# Patient Record
Sex: Female | Born: 1955
Health system: Southern US, Community
[De-identification: ages and names within clinical notes are randomized; demographics above are authoritative.]

## PROBLEM LIST (undated history)

## (undated) ENCOUNTER — Emergency Department: Admission: EM | Payer: Medicare (Managed Care) | Source: Home / Self Care

## (undated) DIAGNOSIS — C541 Malignant neoplasm of endometrium: Secondary | ICD-10-CM

## (undated) DIAGNOSIS — E785 Hyperlipidemia, unspecified: Secondary | ICD-10-CM

## (undated) DIAGNOSIS — E119 Type 2 diabetes mellitus without complications: Secondary | ICD-10-CM

## (undated) DIAGNOSIS — D649 Anemia, unspecified: Secondary | ICD-10-CM

## (undated) DIAGNOSIS — U071 COVID-19: Secondary | ICD-10-CM

## (undated) DIAGNOSIS — I10 Essential (primary) hypertension: Secondary | ICD-10-CM

## (undated) DIAGNOSIS — E669 Obesity, unspecified: Secondary | ICD-10-CM

## (undated) DIAGNOSIS — K219 Gastro-esophageal reflux disease without esophagitis: Secondary | ICD-10-CM

## (undated) DIAGNOSIS — I509 Heart failure, unspecified: Secondary | ICD-10-CM

## (undated) DIAGNOSIS — I2699 Other pulmonary embolism without acute cor pulmonale: Secondary | ICD-10-CM

## (undated) DIAGNOSIS — K31819 Angiodysplasia of stomach and duodenum without bleeding: Secondary | ICD-10-CM

## (undated) DIAGNOSIS — D696 Thrombocytopenia, unspecified: Secondary | ICD-10-CM

## (undated) DIAGNOSIS — A419 Sepsis, unspecified organism: Secondary | ICD-10-CM

## (undated) DIAGNOSIS — Z86718 Personal history of other venous thrombosis and embolism: Secondary | ICD-10-CM

## (undated) DIAGNOSIS — J9600 Acute respiratory failure, unspecified whether with hypoxia or hypercapnia: Secondary | ICD-10-CM

## (undated) DIAGNOSIS — C801 Malignant (primary) neoplasm, unspecified: Secondary | ICD-10-CM

## (undated) DIAGNOSIS — E7800 Pure hypercholesterolemia, unspecified: Secondary | ICD-10-CM

## (undated) HISTORY — DX: Type 2 diabetes mellitus without complications: E11.9

## (undated) HISTORY — DX: Pure hypercholesterolemia, unspecified: E78.00

## (undated) HISTORY — DX: Anemia, unspecified: D64.9

## (undated) HISTORY — DX: Essential (primary) hypertension: I10

## (undated) HISTORY — PX: TUBAL LIGATION: SHX77

---

## 2004-10-09 ENCOUNTER — Emergency Department: Payer: Self-pay | Admitting: Emergency Medicine

## 2007-01-29 ENCOUNTER — Emergency Department: Payer: Self-pay | Admitting: Emergency Medicine

## 2008-03-18 ENCOUNTER — Emergency Department: Payer: Self-pay | Admitting: Emergency Medicine

## 2011-11-02 ENCOUNTER — Emergency Department: Payer: Self-pay | Admitting: *Deleted

## 2014-05-11 ENCOUNTER — Ambulatory Visit: Payer: Self-pay

## 2020-08-18 ENCOUNTER — Encounter: Payer: Self-pay | Admitting: Emergency Medicine

## 2020-08-18 ENCOUNTER — Inpatient Hospital Stay: Payer: Medicare HMO

## 2020-08-18 ENCOUNTER — Emergency Department: Payer: Medicare HMO

## 2020-08-18 ENCOUNTER — Inpatient Hospital Stay
Admission: EM | Admit: 2020-08-18 | Discharge: 2020-08-24 | DRG: 163 | Disposition: A | Discharge status: Skilled Nursing Facility | Payer: Medicare HMO | Attending: Internal Medicine | Admitting: Internal Medicine

## 2020-08-18 ENCOUNTER — Other Ambulatory Visit (INDEPENDENT_AMBULATORY_CARE_PROVIDER_SITE_OTHER): Payer: Self-pay | Admitting: Vascular Surgery

## 2020-08-18 ENCOUNTER — Encounter
Admission: EM | Disposition: A | Discharge status: Skilled Nursing Facility | Payer: Self-pay | Source: Home / Self Care | Attending: Internal Medicine

## 2020-08-18 ENCOUNTER — Other Ambulatory Visit: Payer: Self-pay

## 2020-08-18 DIAGNOSIS — I82452 Acute embolism and thrombosis of left peroneal vein: Secondary | ICD-10-CM | POA: Diagnosis present

## 2020-08-18 DIAGNOSIS — Z807 Family history of other malignant neoplasms of lymphoid, hematopoietic and related tissues: Secondary | ICD-10-CM | POA: Diagnosis not present

## 2020-08-18 DIAGNOSIS — Z20822 Contact with and (suspected) exposure to covid-19: Secondary | ICD-10-CM | POA: Diagnosis present

## 2020-08-18 DIAGNOSIS — N950 Postmenopausal bleeding: Secondary | ICD-10-CM | POA: Diagnosis present

## 2020-08-18 DIAGNOSIS — Z48812 Encounter for surgical aftercare following surgery on the circulatory system: Secondary | ICD-10-CM | POA: Diagnosis not present

## 2020-08-18 DIAGNOSIS — I2699 Other pulmonary embolism without acute cor pulmonale: Secondary | ICD-10-CM | POA: Diagnosis not present

## 2020-08-18 DIAGNOSIS — N939 Abnormal uterine and vaginal bleeding, unspecified: Secondary | ICD-10-CM | POA: Diagnosis not present

## 2020-08-18 DIAGNOSIS — Z6841 Body Mass Index (BMI) 40.0 and over, adult: Secondary | ICD-10-CM | POA: Diagnosis not present

## 2020-08-18 DIAGNOSIS — I10 Essential (primary) hypertension: Secondary | ICD-10-CM | POA: Diagnosis not present

## 2020-08-18 DIAGNOSIS — R0602 Shortness of breath: Secondary | ICD-10-CM

## 2020-08-18 DIAGNOSIS — J9691 Respiratory failure, unspecified with hypoxia: Secondary | ICD-10-CM

## 2020-08-18 DIAGNOSIS — Z87891 Personal history of nicotine dependence: Secondary | ICD-10-CM

## 2020-08-18 DIAGNOSIS — Z743 Need for continuous supervision: Secondary | ICD-10-CM | POA: Diagnosis not present

## 2020-08-18 DIAGNOSIS — R600 Localized edema: Secondary | ICD-10-CM | POA: Diagnosis not present

## 2020-08-18 DIAGNOSIS — Z95828 Presence of other vascular implants and grafts: Secondary | ICD-10-CM | POA: Diagnosis not present

## 2020-08-18 DIAGNOSIS — I82432 Acute embolism and thrombosis of left popliteal vein: Secondary | ICD-10-CM | POA: Diagnosis present

## 2020-08-18 DIAGNOSIS — R000 Tachycardia, unspecified: Secondary | ICD-10-CM | POA: Diagnosis not present

## 2020-08-18 DIAGNOSIS — E1165 Type 2 diabetes mellitus with hyperglycemia: Secondary | ICD-10-CM | POA: Diagnosis not present

## 2020-08-18 DIAGNOSIS — I82442 Acute embolism and thrombosis of left tibial vein: Secondary | ICD-10-CM | POA: Diagnosis not present

## 2020-08-18 DIAGNOSIS — Z23 Encounter for immunization: Secondary | ICD-10-CM

## 2020-08-18 DIAGNOSIS — I110 Hypertensive heart disease with heart failure: Secondary | ICD-10-CM | POA: Diagnosis not present

## 2020-08-18 DIAGNOSIS — J9601 Acute respiratory failure with hypoxia: Secondary | ICD-10-CM | POA: Diagnosis present

## 2020-08-18 DIAGNOSIS — R279 Unspecified lack of coordination: Secondary | ICD-10-CM | POA: Diagnosis not present

## 2020-08-18 DIAGNOSIS — I5031 Acute diastolic (congestive) heart failure: Secondary | ICD-10-CM | POA: Diagnosis not present

## 2020-08-18 DIAGNOSIS — E119 Type 2 diabetes mellitus without complications: Secondary | ICD-10-CM

## 2020-08-18 DIAGNOSIS — D649 Anemia, unspecified: Secondary | ICD-10-CM | POA: Diagnosis present

## 2020-08-18 DIAGNOSIS — E785 Hyperlipidemia, unspecified: Secondary | ICD-10-CM | POA: Diagnosis not present

## 2020-08-18 DIAGNOSIS — Z7901 Long term (current) use of anticoagulants: Secondary | ICD-10-CM | POA: Diagnosis not present

## 2020-08-18 DIAGNOSIS — J9602 Acute respiratory failure with hypercapnia: Secondary | ICD-10-CM

## 2020-08-18 DIAGNOSIS — R0689 Other abnormalities of breathing: Secondary | ICD-10-CM | POA: Diagnosis not present

## 2020-08-18 DIAGNOSIS — I2602 Saddle embolus of pulmonary artery with acute cor pulmonale: Secondary | ICD-10-CM | POA: Diagnosis not present

## 2020-08-18 DIAGNOSIS — E66813 Obesity, class 3: Secondary | ICD-10-CM | POA: Diagnosis present

## 2020-08-18 DIAGNOSIS — M7989 Other specified soft tissue disorders: Secondary | ICD-10-CM | POA: Diagnosis not present

## 2020-08-18 DIAGNOSIS — R5381 Other malaise: Secondary | ICD-10-CM | POA: Diagnosis not present

## 2020-08-18 DIAGNOSIS — I2609 Other pulmonary embolism with acute cor pulmonale: Secondary | ICD-10-CM | POA: Diagnosis not present

## 2020-08-18 DIAGNOSIS — Z7984 Long term (current) use of oral hypoglycemic drugs: Secondary | ICD-10-CM | POA: Diagnosis not present

## 2020-08-18 DIAGNOSIS — I2692 Saddle embolus of pulmonary artery without acute cor pulmonale: Secondary | ICD-10-CM | POA: Diagnosis not present

## 2020-08-18 DIAGNOSIS — R069 Unspecified abnormalities of breathing: Secondary | ICD-10-CM | POA: Diagnosis not present

## 2020-08-18 DIAGNOSIS — I5030 Unspecified diastolic (congestive) heart failure: Secondary | ICD-10-CM | POA: Diagnosis not present

## 2020-08-18 HISTORY — PX: IVC FILTER INSERTION: CATH118245

## 2020-08-18 HISTORY — PX: PULMONARY THROMBECTOMY: CATH118295

## 2020-08-18 LAB — COMPREHENSIVE METABOLIC PANEL
ALT: 11 U/L (ref 0–44)
AST: 23 U/L (ref 15–41)
Albumin: 3.5 g/dL (ref 3.5–5.0)
Alkaline Phosphatase: 70 U/L (ref 38–126)
Anion gap: 12 (ref 5–15)
BUN: 16 mg/dL (ref 8–23)
CO2: 24 mmol/L (ref 22–32)
Calcium: 9.4 mg/dL (ref 8.9–10.3)
Chloride: 102 mmol/L (ref 98–111)
Creatinine, Ser: 0.92 mg/dL (ref 0.44–1.00)
GFR, Estimated: >60 mL/min (ref >60)
Glucose, Bld: 225 mg/dL — ABNORMAL HIGH (ref 70–99)
Potassium: 4 mmol/L (ref 3.5–5.1)
Sodium: 138 mmol/L (ref 135–145)
Total Bilirubin: 0.9 mg/dL (ref 0.3–1.2)
Total Protein: 7.8 g/dL (ref 6.5–8.1)

## 2020-08-18 LAB — CBC WITH DIFFERENTIAL/PLATELET
Abs Immature Granulocytes: 0.03 10*3/uL (ref 0.00–0.07)
Basophils Absolute: 0.1 10*3/uL (ref 0.0–0.1)
Basophils Relative: 1 %
Eosinophils Absolute: 0.1 10*3/uL (ref 0.0–0.5)
Eosinophils Relative: 1 %
HCT: 39.7 % (ref 36.0–46.0)
Hemoglobin: 12.3 g/dL (ref 12.0–15.0)
Immature Granulocytes: 0 %
Lymphocytes Relative: 18 %
Lymphs Abs: 1.6 10*3/uL (ref 0.7–4.0)
MCH: 25.8 pg — ABNORMAL LOW (ref 26.0–34.0)
MCHC: 31 g/dL (ref 30.0–36.0)
MCV: 83.2 fL (ref 80.0–100.0)
Monocytes Absolute: 0.5 10*3/uL (ref 0.1–1.0)
Monocytes Relative: 6 %
Neutro Abs: 6.5 10*3/uL (ref 1.7–7.7)
Neutrophils Relative %: 74 %
Platelets: 269 10*3/uL (ref 150–400)
RBC: 4.77 MIL/uL (ref 3.87–5.11)
RDW: 16.8 % — ABNORMAL HIGH (ref 11.5–15.5)
WBC: 8.8 10*3/uL (ref 4.0–10.5)
nRBC: 0 % (ref 0.0–0.2)

## 2020-08-18 LAB — BRAIN NATRIURETIC PEPTIDE: B Natriuretic Peptide: 720.6 pg/mL — ABNORMAL HIGH (ref 0.0–100.0)

## 2020-08-18 LAB — RESP PANEL BY RT-PCR (FLU A&B, COVID) ARPGX2
Influenza A by PCR: NEGATIVE
Influenza B by PCR: NEGATIVE
SARS Coronavirus 2 by RT PCR: NEGATIVE

## 2020-08-18 LAB — HEPARIN LEVEL (UNFRACTIONATED): Heparin Unfractionated: <0.1 IU/mL — ABNORMAL LOW (ref 0.30–0.70)

## 2020-08-18 LAB — TROPONIN I (HIGH SENSITIVITY): Troponin I (High Sensitivity): 73 ng/L — ABNORMAL HIGH (ref <18)

## 2020-08-18 LAB — APTT: aPTT: 32 seconds (ref 24–36)

## 2020-08-18 LAB — PROTIME-INR
INR: 1.3 — ABNORMAL HIGH (ref 0.8–1.2)
Prothrombin Time: 15.4 seconds — ABNORMAL HIGH (ref 11.4–15.2)

## 2020-08-18 LAB — GLUCOSE, CAPILLARY: Glucose-Capillary: 191 mg/dL — ABNORMAL HIGH (ref 70–99)

## 2020-08-18 LAB — TSH: TSH: 3.425 u[IU]/mL (ref 0.350–4.500)

## 2020-08-18 LAB — PROCALCITONIN: Procalcitonin: <0.1 ng/mL

## 2020-08-18 SURGERY — PULMONARY THROMBECTOMY
Anesthesia: Moderate Sedation

## 2020-08-18 MED ORDER — FAMOTIDINE 20 MG PO TABS: 40.0000 mg | ORAL_TABLET | Freq: Once | ORAL | Status: DC | PRN

## 2020-08-18 MED ORDER — HEPARIN (PORCINE) 25000 UT/250ML-% IV SOLN
1200.0000 [IU]/h | INTRAVENOUS | Status: DC
  Administered 2020-08-18: 1200 [IU]/h via INTRAVENOUS
  Filled 2020-08-18: qty 250

## 2020-08-18 MED ORDER — INFLUENZA VAC A&B SA ADJ QUAD 0.5 ML IM PRSY
0.5000 mL | PREFILLED_SYRINGE | INTRAMUSCULAR | Status: AC
  Administered 2020-08-21: 0.5 mL via INTRAMUSCULAR
  Filled 2020-08-18 (×2): qty 0.5

## 2020-08-18 MED ORDER — FENTANYL CITRATE (PF) 100 MCG/2ML IJ SOLN
INTRAMUSCULAR | Status: DC | PRN
  Administered 2020-08-18: 25 ug via INTRAVENOUS

## 2020-08-18 MED ORDER — METHYLPREDNISOLONE SODIUM SUCC 125 MG IJ SOLR
125.0000 mg | Freq: Once | INTRAMUSCULAR | Status: AC
  Administered 2020-08-18: 125 mg via INTRAVENOUS
  Filled 2020-08-18: qty 2

## 2020-08-18 MED ORDER — MIDAZOLAM HCL 2 MG/2ML IJ SOLN
INTRAMUSCULAR | Status: DC | PRN
  Administered 2020-08-18: 0.5 mg via INTRAVENOUS

## 2020-08-18 MED ORDER — IPRATROPIUM-ALBUTEROL 0.5-2.5 (3) MG/3ML IN SOLN
3.0000 mL | Freq: Once | RESPIRATORY_TRACT | Status: AC
  Administered 2020-08-18: 3 mL via RESPIRATORY_TRACT
  Filled 2020-08-18: qty 3

## 2020-08-18 MED ORDER — ACETAMINOPHEN 325 MG PO TABS: 325.0000 mg | ORAL_TABLET | Freq: Four times a day (QID) | ORAL | Status: AC | PRN

## 2020-08-18 MED ORDER — ONDANSETRON HCL 4 MG/2ML IJ SOLN: 4.0000 mg | Freq: Four times a day (QID) | INTRAMUSCULAR | Status: DC | PRN

## 2020-08-18 MED ORDER — PNEUMOCOCCAL VAC POLYVALENT 25 MCG/0.5ML IJ INJ
0.5000 mL | INJECTION | INTRAMUSCULAR | Status: AC
  Administered 2020-08-21: 0.5 mL via INTRAMUSCULAR
  Filled 2020-08-18: qty 0.5

## 2020-08-18 MED ORDER — MIDAZOLAM HCL 2 MG/2ML IJ SOLN
INTRAMUSCULAR | Status: AC
  Administered 2020-08-18: 1 mg
  Filled 2020-08-18: qty 2

## 2020-08-18 MED ORDER — IODIXANOL 320 MG/ML IV SOLN
INTRAVENOUS | Status: DC | PRN
  Administered 2020-08-18: 75 mL

## 2020-08-18 MED ORDER — MIDAZOLAM HCL 2 MG/ML PO SYRP: 8.0000 mg | ORAL_SOLUTION | Freq: Once | ORAL | Status: DC | PRN

## 2020-08-18 MED ORDER — HYDROMORPHONE HCL 1 MG/ML IJ SOLN: 1.0000 mg | Freq: Once | INTRAMUSCULAR | Status: DC | PRN

## 2020-08-18 MED ORDER — CEFAZOLIN SODIUM-DEXTROSE 1-4 GM/50ML-% IV SOLN
1.0000 g | Freq: Once | INTRAVENOUS | Status: AC
  Administered 2020-08-18: 1 g via INTRAVENOUS
  Filled 2020-08-18: qty 50

## 2020-08-18 MED ORDER — METHYLPREDNISOLONE SODIUM SUCC 125 MG IJ SOLR: 125.0000 mg | Freq: Once | INTRAMUSCULAR | Status: DC | PRN

## 2020-08-18 MED ORDER — HEPARIN BOLUS VIA INFUSION
5000.0000 [IU] | Freq: Once | INTRAVENOUS | Status: AC
  Administered 2020-08-18: 5000 [IU] via INTRAVENOUS
  Filled 2020-08-18: qty 5000

## 2020-08-18 MED ORDER — FENTANYL CITRATE (PF) 100 MCG/2ML IJ SOLN
INTRAMUSCULAR | Status: AC
  Administered 2020-08-18: 50 ug
  Filled 2020-08-18: qty 2

## 2020-08-18 MED ORDER — ACETAMINOPHEN 650 MG RE SUPP: 325.0000 mg | Freq: Four times a day (QID) | RECTAL | Status: AC | PRN

## 2020-08-18 MED ORDER — ONDANSETRON HCL 4 MG PO TABS: 4.0000 mg | ORAL_TABLET | Freq: Four times a day (QID) | ORAL | Status: DC | PRN

## 2020-08-18 MED ORDER — SODIUM CHLORIDE 0.9 % IV SOLN: INTRAVENOUS | Status: DC

## 2020-08-18 MED ORDER — IOHEXOL 350 MG/ML SOLN
75.0000 mL | Freq: Once | INTRAVENOUS | Status: AC | PRN
  Administered 2020-08-18: 75 mL via INTRAVENOUS

## 2020-08-18 MED ORDER — DIPHENHYDRAMINE HCL 50 MG/ML IJ SOLN: 50.0000 mg | Freq: Once | INTRAMUSCULAR | Status: DC | PRN

## 2020-08-18 SURGICAL SUPPLY — 24 items
CANISTER PENUMBRA ENGINE (MISCELLANEOUS) ×1 IMPLANT
CATH ANGIO 5F PIGTAIL 100CM (CATHETERS) ×1 IMPLANT
CATH BEACON 5 .035 100 JB2 TIP (CATHETERS) ×1 IMPLANT
CATH G XP .038X100 (CATHETERS) ×1 IMPLANT
CATH INFINITI JR4 5F (CATHETERS) ×1 IMPLANT
CATH LIGHTNING 8 XTORQ 115 (CATHETERS) ×1 IMPLANT
CATH SELECT BERN TIP 5F 130 (CATHETERS) ×1 IMPLANT
CATH VERT 5X100 (CATHETERS) ×1 IMPLANT
DEVICE SAFEGUARD 24CM (GAUZE/BANDAGES/DRESSINGS) ×1 IMPLANT
GLIDEWIRE ADV .035X260CM (WIRE) ×1 IMPLANT
GLIDEWIRE ANGLED SS 035X260CM (WIRE) ×1 IMPLANT
GUIDEWIRE SUPER STIFF .035X180 (WIRE) ×1 IMPLANT
GUIDEWIRE VERSACORE 260 (WIRE) ×1 IMPLANT
KIT FEMORAL DEL DENALI (Miscellaneous) ×1 IMPLANT
NDL ENTRY 21GA 7CM ECHOTIP (NEEDLE) IMPLANT
NEEDLE ENTRY 21GA 7CM ECHOTIP (NEEDLE) ×2 IMPLANT
PACK ANGIOGRAPHY (CUSTOM PROCEDURE TRAY) ×2 IMPLANT
SET INTRO CAPELLA COAXIAL (SET/KITS/TRAYS/PACK) ×1 IMPLANT
SHEATH BRITE TIP 8FRX11 (SHEATH) ×2 IMPLANT
SHEATH DESTINIATION 65 8FR (SHEATH) ×1 IMPLANT
SYR MEDRAD MARK 7 150ML (SYRINGE) ×1 IMPLANT
TUBING CONTRAST HIGH PRESS 72 (TUBING) ×1 IMPLANT
WIRE AMPLATZ SSTIFF .035X260CM (WIRE) ×1 IMPLANT
WIRE GUIDERIGHT .035X150 (WIRE) ×2 IMPLANT

## 2020-08-18 NOTE — ED Provider Notes (Signed)
[facility] Emergency Department Provider Note  ____________________________________________   Event Date/Time   First MD Initiated Contact with Patient 08/18/20 1249     (approximate)  I have reviewed the triage vital signs and the nursing notes.   HISTORY  Chief Complaint Shortness of Breath    HPI Courtney Mack is a 65 y.o. female otherwise healthy but has not seen a doctor in 10 years who comes in for shortness of breath.  Patient endorses sudden onset of shortness of breath 1-1/2 days ago, constant, nothing makes it better, nothing makes it worse.  Denies any significant cough or fevers.  Is not Covid vaccinated but has not been around anybody with Covid that she knows.  She denies any chest pain or abdominal pain.  States that she always has some swollen legs but nothing that seems to have changed recently.  Denies smoking history or history of asthma.          History reviewed. No pertinent past medical history.  There are no problems to display for this patient.   Past Surgical History:  Procedure Laterality Date  . TUBAL LIGATION      Prior to Admission medications   Not on File    Allergies Patient has no known allergies.  No family history on file.  Social History Social History   Tobacco Use  . Smoking status: Former Research scientist (life sciences)  . Smokeless tobacco: Never Used  Substance Use Topics  . Alcohol use: Not Currently  . Drug use: Not Currently      Review of Systems Constitutional: No fever/chills Eyes: No visual changes. ENT: No sore throat. Cardiovascular: No chest pain Respiratory: Positive for SOB Gastrointestinal: No abdominal pain.  No nausea, no vomiting.  No diarrhea.  No constipation. Genitourinary: Negative for dysuria. Musculoskeletal: Negative for back pain. Skin: Negative for rash. Neurological: Negative for headaches, focal weakness or numbness. All other ROS  negative ____________________________________________   PHYSICAL EXAM:  VITAL SIGNS: ED Triage Vitals  Enc Vitals Group     BP 08/18/20 1155 136/82     Pulse Rate 08/18/20 1150 (!) 110     Resp 08/18/20 1150 (!) 24     Temp 08/18/20 1155 97.7 F (36.5 C)     Temp Source 08/18/20 1155 Oral     SpO2 08/18/20 1150 94 %     Weight 08/18/20 1151 250 lb (113.4 kg)     Height 08/18/20 1151 4\' 10"  (1.473 m)     Head Circumference --      Peak Flow --      Pain Score 08/18/20 1150 0     Pain Loc --      Pain Edu? --      Excl. in [place]? --     Constitutional: Alert and oriented. Well appearing and in no acute distress. Eyes: Conjunctivae are normal. EOMI. Head: Atraumatic. Nose: No congestion/rhinnorhea. Mouth/Throat: Mucous membranes are moist.   Neck: No stridor. Trachea Midline. FROM Cardiovascular: Tachycardic, regular rhythm. Grossly normal heart sounds.  Good peripheral circulation. Respiratory: Increased work of breathing with expiratory and inspiratory wheezing bilaterally Gastrointestinal: Soft and nontender. No distention. No abdominal bruits.  Musculoskeletal: No lower extremity tenderness nor edema.  No joint effusions. Neurologic:  Normal speech and language. No gross focal neurologic deficits are appreciated.  Skin:  Skin is warm, dry and intact. No rash noted. Psychiatric: Mood and affect are normal. Speech and behavior are normal. GU: Deferred   ____________________________________________   LABS (  all labs ordered are listed, but only abnormal results are displayed)  Labs Reviewed  CBC WITH DIFFERENTIAL/PLATELET - Abnormal; Notable for the following components:      Result Value   MCH 25.8 (*)    RDW 16.8 (*)    All other components within normal limits  COMPREHENSIVE METABOLIC PANEL - Abnormal; Notable for the following components:   Glucose, Bld 225 (*)    All other components within normal limits   ____________________________________________   ED ECG  REPORT I, Vanessa [place], the attending physician, personally viewed and interpreted this ECG.  Sinus tachycardia rate of 111, no ST elevation, T wave inversion in lead II, III and V4 V5, normal intervals ____________________________________________  RADIOLOGY Robert Bellow, personally viewed and evaluated these images (plain radiographs) as part of my medical decision making, as well as reviewing the written report by the radiologist.  ED MD interpretation: No pneumonia noted  Official radiology report(s): DG Chest 2 View  Result Date: 08/18/2020 CLINICAL DATA:  Worsening shortness of breath over the past 2 days. EXAM: CHEST - 2 VIEW COMPARISON:  Left rib x-rays dated January 29, 2007. FINDINGS: The heart size and mediastinal contours are within normal limits. Both lungs are clear. The visualized skeletal structures are unremarkable. Mild elevation of the right hemidiaphragm. IMPRESSION: 1. No acute cardiopulmonary disease. Electronically Signed   By: Titus Dubin M.D.   On: 08/18/2020 13:15    ____________________________________________   PROCEDURES  Procedure(s) performed (including Critical Care):  .1-3 Lead EKG Interpretation Performed by: Vanessa [place], MD Authorized by: Vanessa [place], MD     Interpretation: abnormal     ECG rate:  110   ECG rate assessment: normal     Rhythm: sinus tachycardia     Ectopy: none     Conduction: normal    .Critical Care Performed by: Vanessa [place], MD Authorized by: Vanessa Epes, MD   Critical care provider statement:    Critical care time (minutes):  45   Critical care was necessary to treat or prevent imminent or life-threatening deterioration of the following conditions:  Respiratory failure   Critical care was time spent personally by me on the following activities:  Discussions with consultants, evaluation of patient's response to treatment, examination of patient, ordering and performing treatments and interventions, ordering and  review of laboratory studies, ordering and review of radiographic studies, pulse oximetry, re-evaluation of patient's condition, obtaining history from patient or surrogate and review of old charts     ____________________________________________   INITIAL IMPRESSION / [place] / ED COURSE   Courtney Mack was evaluated in Emergency Department on 08/18/2020 for the symptoms described in the history of present illness. She was evaluated in the context of the global COVID-19 pandemic, which necessitated consideration that the patient might be at risk for infection with the SARS-CoV-2 virus that causes COVID-19. Institutional protocols and algorithms that pertain to the evaluation of patients at risk for COVID-19 are in a state of rapid change based on information released by regulatory bodies including the CDC and federal and state organizations. These policies and algorithms were followed during the patient's care in the ED.     Pt presents with SOB. Differential includes: PNA-will get xray to evaluation Anemia-CBC to evaluate ACS- will get trops Arrhythmia-Will get EKG and keep on monitor.  COVID- will get testing per algorithm. PE-if x-ray is negative consider work-up with CT scan   Patient was satting 94% on  room air so placed on 2 L.  She is having increased work of breathing and wheezing bilaterally.  Will trial DuoNeb and see if that helps her work of breathing.  Labs are reassuring although her cardiac marker was elevated to 73 and her BNP was elevated to 720.  Her chest x-ray did not show significant edema.  I am concerned about the possibility of PE given these elevated heart markers in the setting of a negative chest x-ray.  I think we should proceed with CT PE to rule out pulmonary embolism.  Her Covid swab is still pending.  Patient states she feels better after breathing treatments Covid swab was negative. CT scan is still pending.   CT scan is positive for large  PE with right heart strain. I did discuss with the vascular team Dr. Delana Meyer is currently in surgery. Did talk to his PA who was okay with patient being admitted here and started on heparin. Discussed with patient she has no risk factors for bleeding. Patient feels comfortable with coming to the hospital.  ____________________________________________   FINAL CLINICAL IMPRESSION(S) / ED DIAGNOSES   Final diagnoses:  Saddle embolus of pulmonary artery with acute cor pulmonale, unspecified chronicity ([place])  Acute respiratory failure with hypoxia ([place])     MEDICATIONS GIVEN DURING THIS VISIT:  Medications  heparin bolus via infusion 5,000 Units (has no administration in time range)  heparin ADULT infusion 100 units/mL (25000 units/241mL) (has no administration in time range)  ipratropium-albuterol (DUONEB) 0.5-2.5 (3) MG/3ML nebulizer solution 3 mL (3 mLs Nebulization Given 08/18/20 1332)  ipratropium-albuterol (DUONEB) 0.5-2.5 (3) MG/3ML nebulizer solution 3 mL (3 mLs Nebulization Given 08/18/20 1505)  ipratropium-albuterol (DUONEB) 0.5-2.5 (3) MG/3ML nebulizer solution 3 mL (3 mLs Nebulization Given 08/18/20 1505)  methylPREDNISolone sodium succinate (SOLU-MEDROL) 125 mg/2 mL injection 125 mg (125 mg Intravenous Given 08/18/20 1505)  iohexol (OMNIPAQUE) 350 MG/ML injection 75 mL (75 mLs Intravenous Contrast Given 08/18/20 1446)     ED Discharge Orders    None       Note:  This document was prepared using Dragon voice recognition software and may include unintentional dictation errors.   Vanessa Maynard, MD 08/18/20 [phone]

## 2020-08-18 NOTE — Consult Note (Signed)
Courtney Mack  MRN : [id number]  Courtney Mack is a 65 y.o. (07-26-1955) female who presents with chief complaint of  Chief Complaint  Patient presents with  . Shortness of Breath   History of Present Illness:  Courtney Mack is a 65 year old female with medical history significant for has not been to a doctor in greater than 10 years and currently not on any prescribed medications presents to the emergency department for chief concerns of worsening shortness of breath.  She reports the shortness of breath has been ongoing for 1 to 2 days.  She denies associated chest pain, nausea, vomiting, headache, abdominal pain.  She reports that she has never felt this way before.  She states the shortness of breath is worse with exertion.  She denies personal history of carcinoma, weight changes, recent travel including long car rides and or plane rides.  She reports that her paternal aunt had history of lymph node cancer. She reports that she does not go out much and is mainly in the house but she does get up around the house.  She denies prolonged bedbound state.  CTA (08/19/19): 1. Extensive bilateral pulmonary emboli as detailed above. Positive for acute PE with CT evidence of right heart strain (RV/LV Ratio = 1.6) consistent with at least submassive (intermediate risk) PE. The presence of right heart strain has been associated with an increased risk of morbidity and mortality.   Vascular surgery was consulted by Dr. Jari Mack for possible pulmonary thrombectomy/lysis.  Current Facility-Administered Medications  Medication Dose Route Frequency Provider Last Rate Last Admin  . [START ON 08/19/2020] 0.9 %  sodium chloride infusion   Intravenous Continuous Courtney Mack A, Courtney Mack      . 0.9 %  sodium chloride infusion   Intravenous Continuous Courtney Mack A, Courtney Mack      . acetaminophen (TYLENOL) tablet 325 mg  325 mg Oral Q6H PRN Courtney Mack, Courtney  Mack, Courtney Mack       Or  . acetaminophen (TYLENOL) suppository 325 mg  325 mg Rectal Q6H PRN Courtney Mack, Courtney Mack, Courtney Mack      . [START ON 08/19/2020] ceFAZolin (ANCEF) IVPB 1 g/50 mL premix  1 g Intravenous Once Courtney Mack A, Courtney Mack      . diphenhydrAMINE (BENADRYL) injection 50 mg  50 mg Intravenous Once PRN Courtney Mack A, Courtney Mack      . famotidine (PEPCID) tablet 40 mg  40 mg Oral Once PRN Courtney Mack A, Courtney Mack      . heparin ADULT infusion 100 units/mL (25000 units/234mL)  1,200 Units/hr Intravenous Continuous Courtney Mack, Courtney Mack, Courtney Mack 12 mL/hr at 08/18/20 1556 1,200 Units/hr at 08/18/20 1556  . HYDROmorphone (DILAUDID) injection 1 mg  1 mg Intravenous Once PRN Courtney Mack A, Courtney Mack      . methylPREDNISolone sodium succinate (SOLU-MEDROL) 125 mg/2 mL injection 125 mg  125 mg Intravenous Once PRN Oda Placke A, Courtney Mack      . midazolam (VERSED) 2 MG/ML syrup 8 mg  8 mg Oral Once PRN Courtney Mack A, Courtney Mack      . ondansetron (ZOFRAN) tablet 4 mg  4 mg Oral Q6H PRN Courtney Mack, Courtney Mack, Courtney Mack       Or  . ondansetron (ZOFRAN) injection 4 mg  4 mg Intravenous Q6H PRN Courtney Mack, Courtney Mack, Courtney Mack      . ondansetron (ZOFRAN) injection 4 mg  4 mg Intravenous Q6H PRN Courtney Mack, Courtney Harder, Courtney Mack  No current outpatient medications on file.   History reviewed. No pertinent past medical history.  Past Surgical History:  Procedure Laterality Date  . TUBAL LIGATION     Social History Social History   Tobacco Use  . Smoking status: Former Research scientist (life sciences)  . Smokeless tobacco: Never Used  Substance Use Topics  . Alcohol use: Not Currently  . Drug use: Not Currently   Family History No family history on file.  Denies family history of peripheral disease, venous disease or bleeding/clotting disorders.  No Known Allergies  REVIEW OF SYSTEMS (Negative unless checked)  Constitutional: [] Weight loss  [] Fever  [] Chills Cardiac: [] Chest pain   [x] Chest pressure   [] Palpitations   [x] Shortness of breath when laying flat    [x] Shortness of breath at rest   [x] Shortness of breath with exertion. Vascular:  [] Pain in legs with walking   [] Pain in legs at rest   [] Pain in legs when laying flat   [] Claudication   [] Pain in feet when walking  [] Pain in feet at rest  [] Pain in feet when laying flat   [] History of DVT   [] Phlebitis   [] Swelling in legs   [] Varicose veins   [] Non-healing ulcers Pulmonary:   [] Uses home oxygen   [] Productive cough   [] Hemoptysis   [] Wheeze  [] COPD   [] Asthma Neurologic:  [] Dizziness  [] Blackouts   [] Seizures   [] History of stroke   [] History of TIA  [] Aphasia   [] Temporary blindness   [] Dysphagia   [] Weakness or numbness in arms   [] Weakness or numbness in legs Musculoskeletal:  [] Arthritis   [] Joint swelling   [] Joint pain   [] Low back pain Hematologic:  [] Easy bruising  [] Easy bleeding   [] Hypercoagulable state   [] Anemic  [] Hepatitis Gastrointestinal:  [] Blood in stool   [] Vomiting blood  [] Gastroesophageal reflux/heartburn   [] Difficulty swallowing. Genitourinary:  [] Chronic kidney disease   [] Difficult urination  [] Frequent urination  [] Burning with urination   [] Blood in urine Skin:  [] Rashes   [] Ulcers   [] Wounds Psychological:  [] History of anxiety   []  History of major depression.  Physical Examination  Vitals:   08/18/20 1150 08/18/20 1151 08/18/20 1155 08/18/20 1400  BP:   136/82 133/85  Pulse: (!) 110   (!) 102  Resp: (!) 24   (!) 22  Temp:   97.7 F (36.5 C)   TempSrc:   Oral   SpO2: 94%   93%  Weight:  113.4 kg    Height:  4\' 10"  (1.473 m)     Body mass index is 52.25 kg/m. Gen:  WD/WN, labored breathing Head: [place]/AT, No temporalis wasting. Prominent temp pulse not noted. Ear/Nose/Throat: Hearing grossly intact, nares w/o erythema or drainage, oropharynx w/o Erythema/Exudate Eyes: Sclera non-icteric, conjunctiva clear Neck: Trachea midline.  No JVD.  Pulmonary: Labored breathing Cardiac: RRR, normal S1, S2. Vascular:  Vessel Right Left  Radial Palpable Palpable   Ulnar Palpable Palpable  Brachial Palpable Palpable  Carotid Palpable, without bruit Palpable, without bruit  Aorta Not palpable Mack/A  Femoral Palpable Palpable  Popliteal Palpable Palpable  PT Palpable Palpable  DP Palpable Palpable   Gastrointestinal: soft, non-tender/non-distended. No guarding/reflex.  Musculoskeletal: M/S 5/5 throughout.  Extremities without ischemic changes.  No deformity or atrophy. No edema. Neurologic: Sensation grossly intact in extremities.  Symmetrical.  Speech is fluent. Motor exam as listed above. Psychiatric: Judgment intact, Mood & affect appropriate for pt's clinical situation. Dermatologic: No rashes or ulcers noted.  No cellulitis or open wounds. Lymph :  No Cervical, Axillary, or Inguinal lymphadenopathy.  CBC Lab Results  Component Value Date   WBC 8.8 08/18/2020   HGB 12.3 08/18/2020   HCT 39.7 08/18/2020   MCV 83.2 08/18/2020   PLT 269 08/18/2020   BMET    Component Value Date/Time   NA 138 08/18/2020 1159   K 4.0 08/18/2020 1159   CL 102 08/18/2020 1159   CO2 24 08/18/2020 1159   GLUCOSE 225 (H) 08/18/2020 1159   BUN 16 08/18/2020 1159   CREATININE 0.92 08/18/2020 1159   CALCIUM 9.4 08/18/2020 1159   GFRNONAA >60 08/18/2020 1159   Estimated Creatinine Clearance: 67.3 mL/min (by C-G formula based on SCr of 0.92 mg/dL).  COAG No results found for: INR, PROTIME  Radiology DG Chest 2 View  Result Date: 08/18/2020 CLINICAL DATA:  Worsening shortness of breath over the past 2 days. EXAM: CHEST - 2 VIEW COMPARISON:  Left rib x-rays dated January 29, 2007. FINDINGS: The heart size and mediastinal contours are within normal limits. Both lungs are clear. The visualized skeletal structures are unremarkable. Mild elevation of the right hemidiaphragm. IMPRESSION: 1. No acute cardiopulmonary disease. Electronically Signed   By: Titus Dubin M.D.   On: 08/18/2020 13:15   CT Angio Chest PE W and/or Wo Contrast  Result Date: 08/18/2020 CLINICAL  DATA:  Pt to ED via [facility] from home for shortness of breath. Pt states that this started 1.5 days ago and has gotten worse. Pt states that she has not been to the doctor in about 7 to 10 years. States that she does not have any medical history that she is aware of. Pt is taking rapid, shallow breaths in triage. Pt SpO2 94% on room air. Pt states that shortness of breath is worse when she is up and moving around. Pt breathing is labored. EXAM: CT ANGIOGRAPHY CHEST WITH CONTRAST TECHNIQUE: Multidetector CT imaging of the chest was performed using the standard protocol during bolus administration of intravenous contrast. Multiplanar CT image reconstructions and MIPs were obtained to evaluate the vascular anatomy. CONTRAST:  58mL OMNIPAQUE IOHEXOL 350 MG/ML SOLN COMPARISON:  Current chest radiograph. FINDINGS: Cardiovascular: There are significant bilateral pulmonary emboli. A saddle embolus extends across the posterior aspect of the main pulmonary artery into the right and left pulmonary arteries. Additional emboli noted in the distal right and left main pulmonary arteries, extending into the inter lobar pulmonary artery on the right and to into segmental branches to both upper and lower lobes and the right middle lobe. There is evidence of right heart strain with the RV/LV ratio, 4.9 divided by 3.0 = 1.6. Heart is top-normal in size. No pericardial effusion. Aorta is normal in caliber. Mild aortic atherosclerotic calcifications across the arch and descending portions. Mediastinum/Nodes: No neck base, axillary, mediastinal or hilar masses or enlarged lymph nodes. Trachea and esophagus are unremarkable. Lungs/Pleura: Minor areas of relative increased attenuation consistent with vascular shunting. No evidence of pneumonia or pulmonary edema. No lung mass or nodule. No pleural effusion or pneumothorax. Upper Abdomen: No acute or significant abnormality. Musculoskeletal: No fracture or acute finding. No bone lesion. No  chest wall masses. Review of the MIP images confirms the above findings. IMPRESSION: 1. Extensive bilateral pulmonary emboli as detailed above. Positive for acute PE with CTevidence of right heart strain (RV/LV Ratio = 1.6) consistent with at least submassive (intermediate risk) PE. The presence of right heart strain has been associated with an increased risk of morbidity and mortality. Critical Value/emergent results were called  by telephone at the time of interpretation on 08/18/2020 at 3:11 pm to provider Courtney Mack , who verbally acknowledged these results. 2. No other acute abnormality. 3. Mild aortic atherosclerosis. Aortic Atherosclerosis (ICD10-I70.0). Electronically Signed   By: Lajean Manes M.D.   On: 08/18/2020 15:11   Assessment/Plan RASHEEMA TRULUCK is a 66 year old female with medical history significant for has not been to a doctor in greater than 10 years and currently not on any prescribed medications presents to the emergency department for chief concerns of worsening shortness of breath.  1.  Pulmonary embolism: Patient with progressively worsening shortness of breath which prompted her to seek medical attention in our emergency department.  Patient with large submassive PE with associated right heart strain.  In the setting of hypoxia, with a large clot burden, tachycardia, and right heart strain recommend undergoing a pulmonary thrombectomy/thrombolysis in an attempt to lessen the clot burden and improve the patient's symptoms.  Procedure, risks and benefits were cemented with patient.  All questions were answered.  The patient wished to proceed.  2.  Need for anticoagulation: Due to the patient's recent diagnosis of pulmonary embolism he will most likely be on anticoagulation for approximately 1 year. Patient will need to be followed by a pulmonologist  3. Lower extremity DVT: Venous duplex is pending.  Discussed with Dr. Francene Castle,  Courtney Mack  08/18/2020 4:49 PM  This Mack was created with Dragon medical transcription system.  Any error is purely unintentional

## 2020-08-18 NOTE — Progress Notes (Signed)
ANTICOAGULATION CONSULT NOTE - Initial Consult  Pharmacy Consult for Heparin Drip Indication: pulmonary embolus  No Known Allergies  Patient Measurements: Height: 4\' 10"  (147.3 cm) Weight: 113.4 kg (250 lb) IBW/kg (Calculated) : 40.9 Heparin Dosing Weight: 69.8 kg  Vital Signs: Temp: 97.7 F (36.5 C) (02/11 1155) Temp Source: Oral (02/11 1155) BP: 133/85 (02/11 1400) Pulse Rate: 102 (02/11 1400)  Labs: Recent Labs    08/18/20 1159  HGB 12.3  HCT 39.7  PLT 269  CREATININE 0.92  TROPONINIHS 73*    Estimated Creatinine Clearance: 67.3 mL/min (by C-G formula based on SCr of 0.92 mg/dL).   Medical History: History reviewed. No pertinent past medical history.  Medications:  Scheduled:  . heparin  5,000 Units Intravenous Once   Infusions:  . heparin      Assessment: 65 yo F to be started on Heparin drip for bilateral PE w/ heart strain. No anticoag PTA per Med Rec. (Patient states hasnt seen a doctor in 10 yrs.) Hgb 12.3  plt 269  INR/aPTT pending   Goal of Therapy:  Heparin level 0.3-0.7 units/ml Monitor platelets by anticoagulation protocol: Yes   Plan:  Give 5000 units bolus x 1 Start heparin infusion at 1200 units/hr Check anti-Xa level in 6 hours and daily while on heparin Continue to monitor H&H and platelets  Kalandra Masters A 08/18/2020,3:42 PM

## 2020-08-18 NOTE — Progress Notes (Signed)
Tried to call report, okay with bedside report will transport pt via bed. Dr. Delana Meyer at bedside

## 2020-08-18 NOTE — ED Notes (Signed)
Cardiac monitor in place. Heparin started. Second IV placed.

## 2020-08-18 NOTE — H&P (View-Only) (Signed)
[facility] Consult Note  MRN : [id number]  Courtney Mack is a 65 y.o. (July 08, 1956) female who presents with chief complaint of  Chief Complaint  Patient presents with  . Shortness of Breath   History of Present Illness:  Courtney Mack is a 65 year old female with medical history significant for has not been to a doctor in greater than 10 years and currently not on any prescribed medications presents to the emergency department for chief concerns of worsening shortness of breath.  She reports the shortness of breath has been ongoing for 1 to 2 days.  She denies associated chest pain, nausea, vomiting, headache, abdominal pain.  She reports that she has never felt this way before.  She states the shortness of breath is worse with exertion.  She denies personal history of carcinoma, weight changes, recent travel including long car rides and or plane rides.  She reports that her paternal aunt had history of lymph node cancer. She reports that she does not go out much and is mainly in the house but she does get up around the house.  She denies prolonged bedbound state.  CTA (08/19/19): 1. Extensive bilateral pulmonary emboli as detailed above. Positive for acute PE with CT evidence of right heart strain (RV/LV Ratio = 1.6) consistent with at least submassive (intermediate risk) PE. The presence of right heart strain has been associated with an increased risk of morbidity and mortality.   Vascular surgery was consulted by Dr. Jari Pigg for possible pulmonary thrombectomy/lysis.  Current Facility-Administered Medications  Medication Dose Route Frequency Provider Last Rate Last Admin  . [START ON 08/19/2020] 0.9 %  sodium chloride infusion   Intravenous Continuous Stegmayer, Kimberly A, PA-C      . 0.9 %  sodium chloride infusion   Intravenous Continuous Stegmayer, Kimberly A, PA-C      . acetaminophen (TYLENOL) tablet 325 mg  325 mg Oral Q6H PRN Cox, Amy  N, DO       Or  . acetaminophen (TYLENOL) suppository 325 mg  325 mg Rectal Q6H PRN Cox, Amy N, DO      . [START ON 08/19/2020] ceFAZolin (ANCEF) IVPB 1 g/50 mL premix  1 g Intravenous Once Stegmayer, Kimberly A, PA-C      . diphenhydrAMINE (BENADRYL) injection 50 mg  50 mg Intravenous Once PRN Stegmayer, Kimberly A, PA-C      . famotidine (PEPCID) tablet 40 mg  40 mg Oral Once PRN Stegmayer, Kimberly A, PA-C      . heparin ADULT infusion 100 units/mL (25000 units/231mL)  1,200 Units/hr Intravenous Continuous Cox, Amy N, DO 12 mL/hr at 08/18/20 1556 1,200 Units/hr at 08/18/20 1556  . HYDROmorphone (DILAUDID) injection 1 mg  1 mg Intravenous Once PRN Stegmayer, Kimberly A, PA-C      . methylPREDNISolone sodium succinate (SOLU-MEDROL) 125 mg/2 mL injection 125 mg  125 mg Intravenous Once PRN Stegmayer, Kimberly A, PA-C      . midazolam (VERSED) 2 MG/ML syrup 8 mg  8 mg Oral Once PRN Stegmayer, Kimberly A, PA-C      . ondansetron (ZOFRAN) tablet 4 mg  4 mg Oral Q6H PRN Cox, Amy N, DO       Or  . ondansetron (ZOFRAN) injection 4 mg  4 mg Intravenous Q6H PRN Cox, Amy N, DO      . ondansetron (ZOFRAN) injection 4 mg  4 mg Intravenous Q6H PRN Stegmayer, Janalyn Harder, PA-C  No current outpatient medications on file.   History reviewed. No pertinent past medical history.  Past Surgical History:  Procedure Laterality Date  . TUBAL LIGATION     Social History Social History   Tobacco Use  . Smoking status: Former Research scientist (life sciences)  . Smokeless tobacco: Never Used  Substance Use Topics  . Alcohol use: Not Currently  . Drug use: Not Currently   Family History No family history on file.  Denies family history of peripheral disease, venous disease or bleeding/clotting disorders.  No Known Allergies  REVIEW OF SYSTEMS (Negative unless checked)  Constitutional: [] Weight loss  [] Fever  [] Chills Cardiac: [] Chest pain   [x] Chest pressure   [] Palpitations   [x] Shortness of breath when laying flat    [x] Shortness of breath at rest   [x] Shortness of breath with exertion. Vascular:  [] Pain in legs with walking   [] Pain in legs at rest   [] Pain in legs when laying flat   [] Claudication   [] Pain in feet when walking  [] Pain in feet at rest  [] Pain in feet when laying flat   [] History of DVT   [] Phlebitis   [] Swelling in legs   [] Varicose veins   [] Non-healing ulcers Pulmonary:   [] Uses home oxygen   [] Productive cough   [] Hemoptysis   [] Wheeze  [] COPD   [] Asthma Neurologic:  [] Dizziness  [] Blackouts   [] Seizures   [] History of stroke   [] History of TIA  [] Aphasia   [] Temporary blindness   [] Dysphagia   [] Weakness or numbness in arms   [] Weakness or numbness in legs Musculoskeletal:  [] Arthritis   [] Joint swelling   [] Joint pain   [] Low back pain Hematologic:  [] Easy bruising  [] Easy bleeding   [] Hypercoagulable state   [] Anemic  [] Hepatitis Gastrointestinal:  [] Blood in stool   [] Vomiting blood  [] Gastroesophageal reflux/heartburn   [] Difficulty swallowing. Genitourinary:  [] Chronic kidney disease   [] Difficult urination  [] Frequent urination  [] Burning with urination   [] Blood in urine Skin:  [] Rashes   [] Ulcers   [] Wounds Psychological:  [] History of anxiety   []  History of major depression.  Physical Examination  Vitals:   08/18/20 1150 08/18/20 1151 08/18/20 1155 08/18/20 1400  BP:   136/82 133/85  Pulse: (!) 110   (!) 102  Resp: (!) 24   (!) 22  Temp:   97.7 F (36.5 C)   TempSrc:   Oral   SpO2: 94%   93%  Weight:  113.4 kg    Height:  4\' 10"  (1.473 m)     Body mass index is 52.25 kg/m. Gen:  WD/WN, labored breathing Head: [place]/AT, No temporalis wasting. Prominent temp pulse not noted. Ear/Nose/Throat: Hearing grossly intact, nares w/o erythema or drainage, oropharynx w/o Erythema/Exudate Eyes: Sclera non-icteric, conjunctiva clear Neck: Trachea midline.  No JVD.  Pulmonary: Labored breathing Cardiac: RRR, normal S1, S2. Vascular:  Vessel Right Left  Radial Palpable Palpable   Ulnar Palpable Palpable  Brachial Palpable Palpable  Carotid Palpable, without bruit Palpable, without bruit  Aorta Not palpable N/A  Femoral Palpable Palpable  Popliteal Palpable Palpable  PT Palpable Palpable  DP Palpable Palpable   Gastrointestinal: soft, non-tender/non-distended. No guarding/reflex.  Musculoskeletal: M/S 5/5 throughout.  Extremities without ischemic changes.  No deformity or atrophy. No edema. Neurologic: Sensation grossly intact in extremities.  Symmetrical.  Speech is fluent. Motor exam as listed above. Psychiatric: Judgment intact, Mood & affect appropriate for pt's clinical situation. Dermatologic: No rashes or ulcers noted.  No cellulitis or open wounds. Lymph :  No Cervical, Axillary, or Inguinal lymphadenopathy.  CBC Lab Results  Component Value Date   WBC 8.8 08/18/2020   HGB 12.3 08/18/2020   HCT 39.7 08/18/2020   MCV 83.2 08/18/2020   PLT 269 08/18/2020   BMET    Component Value Date/Time   NA 138 08/18/2020 1159   K 4.0 08/18/2020 1159   CL 102 08/18/2020 1159   CO2 24 08/18/2020 1159   GLUCOSE 225 (H) 08/18/2020 1159   BUN 16 08/18/2020 1159   CREATININE 0.92 08/18/2020 1159   CALCIUM 9.4 08/18/2020 1159   GFRNONAA >60 08/18/2020 1159   Estimated Creatinine Clearance: 67.3 mL/min (by C-G formula based on SCr of 0.92 mg/dL).  COAG No results found for: INR, PROTIME  Radiology DG Chest 2 View  Result Date: 08/18/2020 CLINICAL DATA:  Worsening shortness of breath over the past 2 days. EXAM: CHEST - 2 VIEW COMPARISON:  Left rib x-rays dated January 29, 2007. FINDINGS: The heart size and mediastinal contours are within normal limits. Both lungs are clear. The visualized skeletal structures are unremarkable. Mild elevation of the right hemidiaphragm. IMPRESSION: 1. No acute cardiopulmonary disease. Electronically Signed   By: Titus Dubin M.D.   On: 08/18/2020 13:15   CT Angio Chest PE W and/or Wo Contrast  Result Date: 08/18/2020 CLINICAL  DATA:  Pt to ED via [facility] from home for shortness of breath. Pt states that this started 1.5 days ago and has gotten worse. Pt states that she has not been to the doctor in about 7 to 10 years. States that she does not have any medical history that she is aware of. Pt is taking rapid, shallow breaths in triage. Pt SpO2 94% on room air. Pt states that shortness of breath is worse when she is up and moving around. Pt breathing is labored. EXAM: CT ANGIOGRAPHY CHEST WITH CONTRAST TECHNIQUE: Multidetector CT imaging of the chest was performed using the standard protocol during bolus administration of intravenous contrast. Multiplanar CT image reconstructions and MIPs were obtained to evaluate the vascular anatomy. CONTRAST:  51mL OMNIPAQUE IOHEXOL 350 MG/ML SOLN COMPARISON:  Current chest radiograph. FINDINGS: Cardiovascular: There are significant bilateral pulmonary emboli. A saddle embolus extends across the posterior aspect of the main pulmonary artery into the right and left pulmonary arteries. Additional emboli noted in the distal right and left main pulmonary arteries, extending into the inter lobar pulmonary artery on the right and to into segmental branches to both upper and lower lobes and the right middle lobe. There is evidence of right heart strain with the RV/LV ratio, 4.9 divided by 3.0 = 1.6. Heart is top-normal in size. No pericardial effusion. Aorta is normal in caliber. Mild aortic atherosclerotic calcifications across the arch and descending portions. Mediastinum/Nodes: No neck base, axillary, mediastinal or hilar masses or enlarged lymph nodes. Trachea and esophagus are unremarkable. Lungs/Pleura: Minor areas of relative increased attenuation consistent with vascular shunting. No evidence of pneumonia or pulmonary edema. No lung mass or nodule. No pleural effusion or pneumothorax. Upper Abdomen: No acute or significant abnormality. Musculoskeletal: No fracture or acute finding. No bone lesion. No  chest wall masses. Review of the MIP images confirms the above findings. IMPRESSION: 1. Extensive bilateral pulmonary emboli as detailed above. Positive for acute PE with CTevidence of right heart strain (RV/LV Ratio = 1.6) consistent with at least submassive (intermediate risk) PE. The presence of right heart strain has been associated with an increased risk of morbidity and mortality. Critical Value/emergent results were called  by telephone at the time of interpretation on 08/18/2020 at 3:11 pm to provider [facility] , who verbally acknowledged these results. 2. No other acute abnormality. 3. Mild aortic atherosclerosis. Aortic Atherosclerosis (ICD10-I70.0). Electronically Signed   By: Lajean Manes M.D.   On: 08/18/2020 15:11   Assessment/Plan Courtney Mack is a 65 year old female with medical history significant for has not been to a doctor in greater than 10 years and currently not on any prescribed medications presents to the emergency department for chief concerns of worsening shortness of breath.  1.  Pulmonary embolism: Patient with progressively worsening shortness of breath which prompted her to seek medical attention in our emergency department.  Patient with large submassive PE with associated right heart strain.  In the setting of hypoxia, with a large clot burden, tachycardia, and right heart strain recommend undergoing a pulmonary thrombectomy/thrombolysis in an attempt to lessen the clot burden and improve the patient's symptoms.  Procedure, risks and benefits were cemented with patient.  All questions were answered.  The patient wished to proceed.  2.  Need for anticoagulation: Due to the patient's recent diagnosis of pulmonary embolism he will most likely be on anticoagulation for approximately 1 year. Patient will need to be followed by a pulmonologist  3. Lower extremity DVT: Venous duplex is pending.  Discussed with Dr. Francene Castle,  PA-C  08/18/2020 4:49 PM  This note was created with Dragon medical transcription system.  Any error is purely unintentional

## 2020-08-18 NOTE — ED Notes (Signed)
Paper consent discussed, signed and placed on chart. Report to special recovery nurse.

## 2020-08-18 NOTE — Op Note (Signed)
[facility]  Percutaneous Study/Intervention Procedural Note   Date of Surgery: 08/18/2020,6:46 PM  Surgeon:Jacquetta Polhamus, Dolores Lory   Pre-operative Diagnosis: Pulmonary emboli with right heart strain and hypoxia  Post-operative diagnosis:  Same  Procedure(s) Performed:  1.  Selective injection right subsegmental pulmonary arteries  2.  Selective injection left subsegmental pulmonary artery  3.  Infusion of TPA for thrombolysis  4.  Mechanical thrombectomy pulmonary emboli using deep number device             5.  Placement of infrarenal inferior vena cava filter    Anesthesia: Conscious sedation was administered under my direct supervision by the interventional radiology RN. IV Versed plus fentanyl were utilized. Continuous ECG, pulse oximetry and blood pressure was monitored throughout the entire procedure.  Conscious sedation was administered for a total of 1 hour 25 minutes 44 seconds.  Sheath: 8 [place] destination sheath right common femoral vein  Contrast: 75 cc   Fluoroscopy Time: 24.9 minutes  Indications:  Patient presented to the hospital with hypoxia and shortness of breath. The patient is unable to get out of bed and transfer to a chair without increased dyspnea. The patient's O2 saturations off nasal cannula are in the 80%. CT angiogram demonstrated bilateral pulmonary emboli associated with significant right heart strain. Given the long-term sequela and the patient's symptomatic condition the risks and benefits for angiography with thrombectomy are reviewed all questions are answered, the patient agrees to proceed.  Procedure:  Courtney Detwiler Martinezis a 65 y.o. female who was identified and appropriate procedural time out was performed.  The patient was then placed supine on the table and prepped and draped in the usual sterile fashion.  Ultrasound was used to evaluate the right common femoral vein.  It was compressible indicating it is patent .  A digital  ultrasound image was acquired for the permanent record.  A micropuncture needle was used to access the right common femoral vein under direct ultrasound guidance.  A microwire was then advanced under fluoroscopic guidance followed by micro-sheath.  A 0.035 J wire was advanced without resistance and a 5Fr sheath was placed.    The J-wire pigtail catheter was advanced up to the right atrium where a bolus injection contrast was used to demonstrate the pulmonary artery outflow. Floppy Glidewire was then exchanged for the J-wire and the pigtail catheter was exchanged for an JR4. Using the combination of the angled Glidewire and the JR4 catheter the pulmonary outflow track was negotiated.  With the catheter in the left side bolus injection contrast was utilized to demonstrate the left pulmonary artery vasculature. This demonstrated multiple PE essentially occluding the left upper lobe pulmonary artery as well as the left lower lobe pulmonary artery.  TPA could not be used as the patient has also presented with vaginal bleeding.  A Amplatz Super Stiff wire was then advanced and the Pinnacle sheath was exchanged for a 60 cm 8 [place] destination sheath which was seated with its tip at the bifurcation of the pulmonary arteries.  Next a penumbra CAT 8 lightening catheter was advanced over the Amplatz wire and negotiated into left lower lobe.  Multiple passes were made out into the subsegmental branches.  At 1 point I exchanged for an advantage wire so I could better negotiate the wire into the subsegmental branches.  Next the catheter was repositioned and the right upper lobe was engaged and again multiple passes were made.  Follow-up imaging from the left main pulmonary artery demonstrated  there is now forward flow into both the left upper and lower lobes approximately 75 to 80% of the thrombus burden appears to have been removed.  Given this finding I elected to address the right lung fields.  The sheath and penumbra  cat 8 were then negotiated into the right side again imaging was performed through the penumbra catheter which demonstrated near total occlusion of both the right lower lobe middle lobe and upper lobe pulmonary arteries.  I initially engaged the right lower lobe in a similar fashion multiple passes were made using combination the Amplatz wire but also the advantage wire manipulating the wires as a separator would be manipulated.  Once I had performed multiple passes in the right lower lobe I then renegotiated the lightening catheter into the middle lobe.  I was able to get into the right upper lobe and again perform mechanical thrombectomy with several passes as well.  With the penumbra catheter in the proximal right main pulmonary artery hand-injection contrast was performed which now demonstrated forward flow within the right lower and middle lobes.  There is mild to moderate retained thrombus in the upper lobe.  Further attempts at clearing this thrombus did not yield any dramatic improvement and I elected to terminate the thrombectomy portion.  The lightening catheter was removed from the pulmonary outflow tract and the sheath was pulled back into the inferior vena cava.  Amplatz wire was reintroduced and the destination sheath was exchanged for the Burton filter delivery sheath.  Sheath was positioned at the distal inferior vena cava and hand-injection contrast demonstrated the IVC.  Renal blushes were noted at the L2 level and the [facility] filter was then deployed at the L3 level and upright orientation.  After review these images the catheter and sheath were removed and pressure held. There were no immediate complications.  Findings:   Right pulmonary artery: Initial imaging demonstrated near absence of flow in the right middle lower and upper lobes.  Following thrombectomy all 3 lobes have been reopened although there is still mild mild to moderate residual thrombus noted in the upper lobe the  middle and lower lobes appear to have been near totally cleared of thrombus.  Left pulmonary: Initial imaging demonstrated near occlusion of the left lower lobe and left upper lobe pulmonary arteries.  Following thrombectomy there is now flow in both the upper and lower with near total resolution of thrombus.  Inferior vena cava is widely patent measuring 20 mm in diameter with renal blushes at the L2 level.  Denali filter is deployed at the L3 level in good orientation.    Disposition: Patient was taken to the recovery room in stable condition having tolerated the procedure well.  Courtney Mack 08/18/2020,6:46 PM

## 2020-08-18 NOTE — Interval H&P Note (Signed)
History and Physical Interval Note:  08/18/2020 5:26 PM  Courtney Mack  has presented today for surgery, with the diagnosis of PE.  The various methods of treatment have been discussed with the patient and family. After consideration of risks, benefits and other options for treatment, the patient has consented to  Procedure(s): PULMONARY THROMBECTOMY (N/A) as a surgical intervention.  The patient's history has been reviewed, patient examined, no change in status, stable for surgery.  I have reviewed the patient's chart and labs.  Questions were answered to the patient's satisfaction.     Hortencia Pilar

## 2020-08-18 NOTE — ED Triage Notes (Signed)
Pt to ED via [facility] from home for shortness of breath. Pt states that this started 1.5 days ago and has gotten worse. Pt states that she has not been to the doctor in about 7 to 10 years. States that she does not have any medical history that she is aware of. Pt is taking rapid, shallow breaths in triage. Pt SpO2 94% on room air. Pt states that shortness of breath is worse when she is up and moving around. Pt breathing is labored.

## 2020-08-18 NOTE — ED Notes (Signed)
First Nurse Note: Pt to ED via [facility] from home for shortness of breath x 2 days, worse today. Pt is not normally on O2. Pt states no past medical history. Pts O2 sats in 90s. RLL sound diminished. ST at 114. CBG 262. 20 G LAC. Pt is currently on 2L via [place], sats 98%

## 2020-08-18 NOTE — ED Notes (Signed)
Pt presents to ED with c/o of SOB that started yesterday. Pt denies any significant lung issues. Pt does have an increased work of breathing and has exp wheezes throughout. Pt has swelling to bilateral lower legs (2+ pitting edema) and denies a HX of CHF. Pt does not wear O2 at home, pt low 90's on RA, pt placed on 2L/min for comfort and distress. Pt denies any HX of blood clots. Pt is A&Ox4.

## 2020-08-18 NOTE — Progress Notes (Signed)
Cross Cover Report called from [facility] radiology of lower extremity dopplers for non occlusive CVT left popliteal left posterior tibial and peroneal veins. Already on heparin.

## 2020-08-18 NOTE — H&P (Addendum)
History and Physical   Courtney Mack JME:[id number] DOB: [birth year] DOA: 08/18/2020  PCP: Patient, No Pcp Per  Patient coming from: home  I have personally briefly reviewed patient's old medical records in [place].  Chief Concern: Shortness of breath  HPI: Courtney Mack is a 65 y.o. female with medical history significant for has not been to a doctor in greater than 10 years and currently not on any prescribed medications presents to the emergency department for chief concerns of worsening shortness of breath.  She reports the shortness of breath has been ongoing for 1 to 2 days.  She denies associated chest pain, nausea, vomiting, headache, abdominal pain.  She reports that she has never felt this way before.  She states the shortness of breath is worse with exertion.  She does endorse that she will have a PCP establish care appointment on 08/30/2020.  She denies personal history of carcinoma, weight changes, recent travel including long car rides and or plane rides.  She reports that her paternal aunt had history of lymph node cancer.  She reports that she does not go out much and is mainly in the house but she does get up around the house.  She denies prolonged bedbound state.  Patient had 1 sip of soda today, 08/18/2020 at approximately 8 AM.  Social history: Patient is currently retired and formerly worked as a Regulatory affairs officer.  She denies tobacco use.  Vaccination: Patient is not vaccinated for COVID-19  ROS: Constitutional: no weight change, no fever ENT/Mouth: no sore throat, no rhinorrhea Eyes: no eye pain, no vision changes Cardiovascular: no chest pain, no dyspnea,  no edema, no palpitations Respiratory: no cough, no sputum, no wheezing Gastrointestinal: no nausea, no vomiting, no diarrhea, no constipation Genitourinary: no urinary incontinence, no dysuria, no hematuria Musculoskeletal: no arthralgias, no myalgias Skin: no skin lesions, no pruritus, Neuro: +  weakness, no loss of consciousness, no syncope Psych: no anxiety, no depression, + decrease appetite Heme/Lymph: no bruising, no bleeding  ED Course: Discussed with ED provider, patient requiring hospitalization due to bilateral pulmonary embolism on CTA.  Patient is not vaccinated for COVID-19 and Covid test came back negative.  ED provider also gave patient DuoNeb x3, Solu-Medrol 125 mg IV once.  CTA ordered and was found to have bilateral massive pulmonary embolism.  ED provider started patient on heparin GTT and consulted vascular.  Vascular Dr. Evelina Bucy will take patient in the OR for thrombectomy.  Assessment/Plan  Active Problems:   Pulmonary embolism (HCC)   # Acute hypoxic respiratory failure secondary to extensive bilateral pulmonary embolism with right heart strain-etiology unclear at this time patient's only risk factor at this time is obesity however patient has never had a colonoscopy and her mammogram was more than 10 years ago and she has not had a papsmear in a long time -Heparin GTT -Admit to stepdown with cardiac monitoring -Vascular will take patient into OR for thrombectomy -Bilateral lower extremity ultrasound to assess for DVTs -Complete echo ordered -Checking TSH -N.p.o.   # Vaginal bleeding-patient is 65 years old has not been to a PCP in greater than 10 years this is concerning for cervical cancer -As her H&H is stable,  -I will evaluate her after thrombectomy to determine whether or not we need inpatient OB consultation. If patient remains stable and the bleeding is not overt, would recommend discharging provider to refer patient for outpatient OB/GYN follow-up for a Pap smear and pelvic exam - I evaluated patient at  bedside with RN Tad Moore as chaperone and vaginal pad from ED has minimal amount of blood (the amount of a menstruating female on her last day of her cycle). I did not do a pelvic exam as patient is on bedrest status post thrombectomy. At this  time, I recommend her pcp appointment on 08/30/20 to refer patient to OBGyn for pelvic exam and pap smear. This was communicated with patient at bedside and note written as such and given to patient.   # Sinus tachycardia-secondary to bilateral pulmonary embolism Tachypnea-secondary to pulmonary embolism  # Obesity-would recommend safe outpatient weight loss regimen upon discharge once patient is more stable  Chart reviewed and no previous medical records on file  # General health maintenance-recommended the patient have regular annual checkups and recommended mammogram and colonoscopy and Pap smears - Verbal communication to patient and note given to patient at bedside: at PCP appointment on 08/30/20, patient will need to be sent for mammogram, referral to GI for colonoscopy, referral to OBgyn for pap smear and pelvic exam.   DVT prophylaxis: Heparin GTT Code Status: Full code Diet: N.p.o. Family Communication: Updated daughter at bedside Disposition Plan: Pending vascular evaluation and thrombectomy Consults called: Vascular Admission status: Inpatient to stepdown  History reviewed. No pertinent past medical history.  Past Surgical History:  Procedure Laterality Date  . TUBAL LIGATION     Social History:  reports that she has quit smoking. She has never used smokeless tobacco. She reports previous alcohol use. She reports previous drug use.  Family history: Family history reviewed and not pertinent  Prior to Admission medications   Not on File   Physical Exam: Vitals:   08/18/20 1150 08/18/20 1151 08/18/20 1155 08/18/20 1400  BP:   136/82 133/85  Pulse: (!) 110   (!) 102  Resp: (!) 24   (!) 22  Temp:   97.7 F (36.5 C)   TempSrc:   Oral   SpO2: 94%   93%  Weight:  113.4 kg    Height:  4\' 10"  (1.473 m)     Constitutional: appears age-appropriate, mildly anxious Eyes: PERRL, lids and conjunctivae normal ENMT: Mucous membranes are moist. Posterior pharynx clear of any  exudate or lesions. Poor dentition. Hearing appropriate Neck: normal, supple, no masses, no thyromegaly Respiratory: clear to auscultation bilaterally, no wheezing, no crackles. Increased respiratory effort. Increase accessory muscle use.  Cardiovascular: Regular rate and rhythm, no murmurs / rubs / gallops. No extremity edema. 2+ pedal pulses. No carotid bruits.  Abdomen: Obese abdomen no tenderness, no masses palpated, no hepatosplenomegaly. Bowel sounds positive.  Musculoskeletal: no clubbing / cyanosis. No joint deformity upper and lower extremities. Good ROM, no contractures, no atrophy. Normal muscle tone.  Skin: no rashes, lesions, ulcers. No induration Neurologic: Sensation intact. Strength 5/5 in all 4.  Psychiatric: Normal judgment and insight. Alert and oriented x 3. Normal mood.   EKG: independently reviewed, showing sinus tachycardia with rate of 111, QTc 476  Chest x-ray on Admission: I personally reviewed and I agree with radiologist reading as below.  DG Chest 2 View  Result Date: 08/18/2020 CLINICAL DATA:  Worsening shortness of breath over the past 2 days. EXAM: CHEST - 2 VIEW COMPARISON:  Left rib x-rays dated January 29, 2007. FINDINGS: The heart size and mediastinal contours are within normal limits. Both lungs are clear. The visualized skeletal structures are unremarkable. Mild elevation of the right hemidiaphragm. IMPRESSION: 1. No acute cardiopulmonary disease. Electronically Signed   By: Huntley Dec  Derry M.D.   On: 08/18/2020 13:15   CT Angio Chest PE W and/or Wo Contrast  Result Date: 08/18/2020 CLINICAL DATA:  Pt to ED via [facility] from home for shortness of breath. Pt states that this started 1.5 days ago and has gotten worse. Pt states that she has not been to the doctor in about 7 to 10 years. States that she does not have any medical history that she is aware of. Pt is taking rapid, shallow breaths in triage. Pt SpO2 94% on room air. Pt states that shortness of breath is  worse when she is up and moving around. Pt breathing is labored. EXAM: CT ANGIOGRAPHY CHEST WITH CONTRAST TECHNIQUE: Multidetector CT imaging of the chest was performed using the standard protocol during bolus administration of intravenous contrast. Multiplanar CT image reconstructions and MIPs were obtained to evaluate the vascular anatomy. CONTRAST:  26mL OMNIPAQUE IOHEXOL 350 MG/ML SOLN COMPARISON:  Current chest radiograph. FINDINGS: Cardiovascular: There are significant bilateral pulmonary emboli. A saddle embolus extends across the posterior aspect of the main pulmonary artery into the right and left pulmonary arteries. Additional emboli noted in the distal right and left main pulmonary arteries, extending into the inter lobar pulmonary artery on the right and to into segmental branches to both upper and lower lobes and the right middle lobe. There is evidence of right heart strain with the RV/LV ratio, 4.9 divided by 3.0 = 1.6. Heart is top-normal in size. No pericardial effusion. Aorta is normal in caliber. Mild aortic atherosclerotic calcifications across the arch and descending portions. Mediastinum/Nodes: No neck base, axillary, mediastinal or hilar masses or enlarged lymph nodes. Trachea and esophagus are unremarkable. Lungs/Pleura: Minor areas of relative increased attenuation consistent with vascular shunting. No evidence of pneumonia or pulmonary edema. No lung mass or nodule. No pleural effusion or pneumothorax. Upper Abdomen: No acute or significant abnormality. Musculoskeletal: No fracture or acute finding. No bone lesion. No chest wall masses. Review of the MIP images confirms the above findings. IMPRESSION: 1. Extensive bilateral pulmonary emboli as detailed above. Positive for acute PE with CTevidence of right heart strain (RV/LV Ratio = 1.6) consistent with at least submassive (intermediate risk) PE. The presence of right heart strain has been associated with an increased risk of morbidity and  mortality. Critical Value/emergent results were called by telephone at the time of interpretation on 08/18/2020 at 3:11 pm to provider [facility] , who verbally acknowledged these results. 2. No other acute abnormality. 3. Mild aortic atherosclerosis. Aortic Atherosclerosis (ICD10-I70.0). Electronically Signed   By: Lajean Manes M.D.   On: 08/18/2020 15:11   Labs on Admission: I have personally reviewed following labs  CBC: Recent Labs  Lab 08/18/20 1159  WBC 8.8  NEUTROABS 6.5  HGB 12.3  HCT 39.7  MCV 83.2  PLT 163   Basic Metabolic Panel: Recent Labs  Lab 08/18/20 1159  NA 138  K 4.0  CL 102  CO2 24  GLUCOSE 225*  BUN 16  CREATININE 0.92  CALCIUM 9.4   GFR: Estimated Creatinine Clearance: 67.3 mL/min (by C-G formula based on SCr of 0.92 mg/dL).  Liver Function Tests: Recent Labs  Lab 08/18/20 1159  AST 23  ALT 11  ALKPHOS 70  BILITOT 0.9  PROT 7.8  ALBUMIN 3.5   CRITICAL CARE Performed by: Briant Cedar Amoni Scallan  Total critical care time: 30 minutes  Critical care time was exclusive of separately billable procedures and treating other patients.  Critical care was necessary to treat or  prevent imminent or life-threatening deterioration. Respiratory failure secondary to massive saddle pulmonary emboli  Critical care was time spent personally by me on the following activities: development of treatment plan with patient and/or surrogate as well as nursing, discussions with consultants, evaluation of patient's response to treatment, examination of patient, obtaining history from patient or surrogate, ordering and performing treatments and interventions, ordering and review of laboratory studies, ordering and review of radiographic studies, pulse oximetry and re-evaluation of patient's condition.  Carmaleta Youngers N Demara Lover D.O. [facility]  If 7PM-7AM, please contact overnight-coverage provider If 7AM-7PM, please contact day coverage provider [email or web address]  08/18/2020, 3:47 PM

## 2020-08-19 ENCOUNTER — Inpatient Hospital Stay: Payer: Medicare HMO

## 2020-08-19 ENCOUNTER — Inpatient Hospital Stay (HOSPITAL_COMMUNITY)
Admit: 2020-08-19 | Discharge: 2020-08-19 | Disposition: A | Discharge status: Home / Self Care | Payer: Medicare HMO | Attending: Internal Medicine | Admitting: Internal Medicine

## 2020-08-19 DIAGNOSIS — J9601 Acute respiratory failure with hypoxia: Secondary | ICD-10-CM | POA: Diagnosis not present

## 2020-08-19 DIAGNOSIS — I2609 Other pulmonary embolism with acute cor pulmonale: Secondary | ICD-10-CM | POA: Diagnosis not present

## 2020-08-19 DIAGNOSIS — I2602 Saddle embolus of pulmonary artery with acute cor pulmonale: Secondary | ICD-10-CM | POA: Diagnosis not present

## 2020-08-19 LAB — BASIC METABOLIC PANEL
Anion gap: 10 (ref 5–15)
BUN: 18 mg/dL (ref 8–23)
CO2: 22 mmol/L (ref 22–32)
Calcium: 8.9 mg/dL (ref 8.9–10.3)
Chloride: 106 mmol/L (ref 98–111)
Creatinine, Ser: 0.83 mg/dL (ref 0.44–1.00)
GFR, Estimated: >60 mL/min (ref >60)
Glucose, Bld: 320 mg/dL — ABNORMAL HIGH (ref 70–99)
Potassium: 4.4 mmol/L (ref 3.5–5.1)
Sodium: 138 mmol/L (ref 135–145)

## 2020-08-19 LAB — CBC
HCT: 34.9 % — ABNORMAL LOW (ref 36.0–46.0)
Hemoglobin: 10.9 g/dL — ABNORMAL LOW (ref 12.0–15.0)
MCH: 26 pg (ref 26.0–34.0)
MCHC: 31.2 g/dL (ref 30.0–36.0)
MCV: 83.1 fL (ref 80.0–100.0)
Platelets: 232 10*3/uL (ref 150–400)
RBC: 4.2 MIL/uL (ref 3.87–5.11)
RDW: 17 % — ABNORMAL HIGH (ref 11.5–15.5)
WBC: 9.1 10*3/uL (ref 4.0–10.5)
nRBC: 0 % (ref 0.0–0.2)

## 2020-08-19 LAB — HIV ANTIBODY (ROUTINE TESTING W REFLEX): HIV Screen 4th Generation wRfx: NONREACTIVE

## 2020-08-19 LAB — GLUCOSE, CAPILLARY
Glucose-Capillary: 269 mg/dL — ABNORMAL HIGH (ref 70–99)
Glucose-Capillary: 188 mg/dL — ABNORMAL HIGH (ref 70–99)
Glucose-Capillary: 218 mg/dL — ABNORMAL HIGH (ref 70–99)

## 2020-08-19 LAB — HEPARIN LEVEL (UNFRACTIONATED)
Heparin Unfractionated: 0.19 IU/mL — ABNORMAL LOW (ref 0.30–0.70)
Heparin Unfractionated: 0.78 IU/mL — ABNORMAL HIGH (ref 0.30–0.70)
Heparin Unfractionated: 0.77 IU/mL — ABNORMAL HIGH (ref 0.30–0.70)

## 2020-08-19 LAB — HEMOGLOBIN A1C
Hgb A1c MFr Bld: 6.6 % — ABNORMAL HIGH (ref 4.8–5.6)
Mean Plasma Glucose: 142.72 mg/dL

## 2020-08-19 MED ORDER — INSULIN ASPART 100 UNIT/ML ~~LOC~~ SOLN
0.0000 [IU] | Freq: Every day | SUBCUTANEOUS | Status: DC
  Administered 2020-08-20: 2 [IU] via SUBCUTANEOUS
  Filled 2020-08-19: qty 1

## 2020-08-19 MED ORDER — RIVAROXABAN 15 MG PO TABS
15.0000 mg | ORAL_TABLET | Freq: Two times a day (BID) | ORAL | Status: DC
  Administered 2020-08-20 – 2020-08-24 (×9): 15 mg via ORAL
  Filled 2020-08-19 (×12): qty 1

## 2020-08-19 MED ORDER — RIVAROXABAN 20 MG PO TABS: 20.0000 mg | ORAL_TABLET | Freq: Every day | ORAL | Status: DC

## 2020-08-19 MED ORDER — HEPARIN BOLUS VIA INFUSION
2100.0000 [IU] | Freq: Once | INTRAVENOUS | Status: AC
  Administered 2020-08-19: 2100 [IU] via INTRAVENOUS
  Filled 2020-08-19: qty 2100

## 2020-08-19 MED ORDER — HEPARIN (PORCINE) 25000 UT/250ML-% IV SOLN
1200.0000 [IU]/h | INTRAVENOUS | Status: AC
  Administered 2020-08-19: 1400 [IU]/h via INTRAVENOUS
  Administered 2020-08-19: 1300 [IU]/h via INTRAVENOUS
  Filled 2020-08-19 (×2): qty 250

## 2020-08-19 MED ORDER — INSULIN ASPART 100 UNIT/ML ~~LOC~~ SOLN
0.0000 [IU] | Freq: Three times a day (TID) | SUBCUTANEOUS | Status: DC
  Administered 2020-08-19: 3 [IU] via SUBCUTANEOUS
  Administered 2020-08-19: 8 [IU] via SUBCUTANEOUS
  Administered 2020-08-20: 2 [IU] via SUBCUTANEOUS
  Administered 2020-08-20: 3 [IU] via SUBCUTANEOUS
  Administered 2020-08-20 – 2020-08-24 (×8): 2 [IU] via SUBCUTANEOUS
  Administered 2020-08-24: 13:00:00 3 [IU] via SUBCUTANEOUS
  Filled 2020-08-19 (×12): qty 1

## 2020-08-19 NOTE — Progress Notes (Signed)
Air slowly removed from pressure dressing. PAD removed from the R groin without complications.  No bleeding noted. No hematoma. Area surrounding is soft, dry, and intact. Pt denies any pain. Transparent dressing in place. Good pulses noted Will continue to closely monitor.

## 2020-08-19 NOTE — Progress Notes (Signed)
PROGRESS NOTE    Courtney Mack  BDZ:[id number] DOB: [birth year] DOA: 08/18/2020 PCP: Patient, No Pcp Per   Brief Narrative: Taken from H&P. Courtney Mack is a 65 y.o. female with medical history significant for has not been to a doctor in greater than 10 years and currently not on any prescribed medications presents to the emergency department for chief concerns of worsening shortness of breath for the past 2 days.  No chest pain. No obvious VTE risk factor, no traveling or prolonged immobilization.  No history of active cancer. CTA with bilateral massive PE with right heart strain. Venous Doppler was positive for bilateral DVTs. She was taken to the OR by vascular surgeon for thrombectomy and thrombolysis, IVC filter was also placed. Patient is unvaccinated and Covid test came back negative. She will need outpatient oncologic work-up, as having some postmenopausal bleeding.  Subjective: Patient, remained little short of breath, still requiring up to 4 L of oxygen. Patient denies any prior diagnosis of hypertension or diabetes but have not seen a physician in years.  Assessment & Plan:   Principal Problem:   Pulmonary embolism (HCC) Active Problems:   Vaginal bleeding, abnormal   Obesity, morbid, BMI 50 or higher ([place])  Acute hypoxic respiratory failure secondary to massive bilateral PE with right heart strain.  S/p suction embolectomy and thrombolysis by vascular surgery.  IVC filter was placed.  Venous Doppler positive for bilateral LE DVTs.  Continue to require up to 4 L of oxygen, does not use oxygen at baseline.  Patient without any medical help for many years, no other obvious risk factor but did not had any cancer screening. Having some postmenopausal bleeding. -Continue with heparin infusion till tomorrow and then transition to Xarelto. -Continue with supplemental oxygen-wean as tolerated. -Echocardiogram done-pending results -Patient needs oncology work-up as an  outpatient.  Hyperglycemia.  Blood sugar in 200s to 300s range.  No prior diagnosis of diabetes as she has not seen physician for a long time. -Check A1c -Add SSI -Check lipid panel  Elevated blood pressure.  No prior diagnosis of hypertension but patient is very obese.  Mildly elevated blood pressure can be due to PE burden. -Continue to monitor -If remained elevated then we can add lisinopril or losartan.  Stage III obesity. Body mass index is 51.77 kg/m.  That makes her high risk for diabetes, hypertension along with other comorbidities. She was counseled for weight loss and will need continuous counseling by PCP.  Objective: Vitals:   08/19/20 0300 08/19/20 0400 08/19/20 0421 08/19/20 0732  BP: 119/83 119/76 106/80 135/86  Pulse: 96 91 93 90  Resp: (!) 25 (!) 25 (!) 21 20  Temp:   98.4 F (36.9 C) 98.2 F (36.8 C)  TempSrc:   Oral Oral  SpO2: 93% 94% 93% 94%  Weight:   112.4 kg   Height:        Intake/Output Summary (Last 24 hours) at 08/19/2020 1220 Last data filed at 08/19/2020 0000 Gross per 24 hour  Intake --  Output 0 ml  Net 0 ml   Filed Weights   08/18/20 1151 08/18/20 1701 08/19/20 0421  Weight: 113.4 kg 113.4 kg 112.4 kg    Examination:  General exam: Appears calm and comfortable  Respiratory system: Clear to auscultation. Respiratory effort normal. Cardiovascular system: S1 & S2 heard, RRR.  Gastrointestinal system: Soft, nontender, nondistended, bowel sounds positive. Central nervous system: Alert and oriented. No focal neurological deficits.Symmetric 5 x 5 power. Extremities: No edema,  no cyanosis, pulses intact and symmetrical. Psychiatry: Judgement and insight appear normal. Mood & affect appropriate.    DVT prophylaxis: Heparin infusion Code Status: Full Family Communication: Discussed with patient Disposition Plan:  Status is: Inpatient  Remains inpatient appropriate because:Inpatient level of care appropriate due to severity of  illness   Dispo: The patient is from: Home              Anticipated d/c is to: Home              Anticipated d/c date is: 1 day              Patient currently is not medically stable to d/c.   Difficult to place patient No              Level of care: Progressive Cardiac  Consultants:   Vascular surgery  Procedures:  Suction embolectomy. Thrombolysis IVC filter placement  Antimicrobials:   Data Reviewed: I have personally reviewed following labs and imaging studies  CBC: Recent Labs  Lab 08/18/20 1159 08/19/20 0303  WBC 8.8 9.1  NEUTROABS 6.5  --   HGB 12.3 10.9*  HCT 39.7 34.9*  MCV 83.2 83.1  PLT 269 564   Basic Metabolic Panel: Recent Labs  Lab 08/18/20 1159 08/19/20 0303  NA 138 138  K 4.0 4.4  CL 102 106  CO2 24 22  GLUCOSE 225* 320*  BUN 16 18  CREATININE 0.92 0.83  CALCIUM 9.4 8.9   GFR: Estimated Creatinine Clearance: 74.1 mL/min (by C-G formula based on SCr of 0.83 mg/dL). Liver Function Tests: Recent Labs  Lab 08/18/20 1159  AST 23  ALT 11  ALKPHOS 70  BILITOT 0.9  PROT 7.8  ALBUMIN 3.5   No results for input(s): LIPASE, AMYLASE in the last 168 hours. No results for input(s): AMMONIA in the last 168 hours. Coagulation Profile: Recent Labs  Lab 08/18/20 2229  INR 1.3*   Cardiac Enzymes: No results for input(s): CKTOTAL, CKMB, CKMBINDEX, TROPONINI in the last 168 hours. BNP (last 3 results) No results for input(s): PROBNP in the last 8760 hours. HbA1C: No results for input(s): HGBA1C in the last 72 hours. CBG: Recent Labs  Lab 08/18/20 1856 08/19/20 1126  GLUCAP 191* 269*   Lipid Profile: No results for input(s): CHOL, HDL, LDLCALC, TRIG, CHOLHDL, LDLDIRECT in the last 72 hours. Thyroid Function Tests: Recent Labs    08/18/20 2229  TSH 3.425   Anemia Panel: No results for input(s): VITAMINB12, FOLATE, FERRITIN, TIBC, IRON, RETICCTPCT in the last 72 hours. Sepsis Labs: Recent Labs  Lab 08/18/20 1159  PROCALCITON  <0.10    Recent Results (from the past 240 hour(s))  Resp Panel by RT-PCR (Flu A&B, Covid) Nasopharyngeal Swab     Status: None   Collection Time: 08/18/20  1:24 PM   Specimen: Nasopharyngeal Swab; Nasopharyngeal(NP) swabs in vial transport medium  Result Value Ref Range Status   SARS Coronavirus 2 by RT PCR NEGATIVE NEGATIVE Final    Comment: (NOTE) SARS-CoV-2 target nucleic acids are NOT DETECTED.  The SARS-CoV-2 RNA is generally detectable in upper respiratory specimens during the acute phase of infection. The lowest concentration of SARS-CoV-2 viral copies this assay can detect is 138 copies/mL. A negative result does not preclude SARS-Cov-2 infection and should not be used as the sole basis for treatment or other patient management decisions. A negative result may occur with  improper specimen collection/handling, submission of specimen other than nasopharyngeal swab, presence of viral mutation(s)  within the areas targeted by this assay, and inadequate number of viral copies(<138 copies/mL). A negative result must be combined with clinical observations, patient history, and epidemiological information. The expected result is Negative.  Fact Sheet for Patients:  EntrepreneurPulse.com.au  Fact Sheet for Healthcare Providers:  IncredibleEmployment.be  This test is no t yet approved or cleared by the [place] FDA and  has been authorized for detection and/or diagnosis of SARS-CoV-2 by FDA under an Emergency Use Authorization (EUA). This EUA will remain  in effect (meaning this test can be used) for the duration of the COVID-19 declaration under Section 564(b)(1) of the Act, 21 U.S.C.section 360bbb-3(b)(1), unless the authorization is terminated  or revoked sooner.       Influenza A by PCR NEGATIVE NEGATIVE Final   Influenza B by PCR NEGATIVE NEGATIVE Final    Comment: (NOTE) The Xpert Xpress SARS-CoV-2/FLU/RSV plus assay is intended  as an aid in the diagnosis of influenza from Nasopharyngeal swab specimens and should not be used as a sole basis for treatment. Nasal washings and aspirates are unacceptable for Xpert Xpress SARS-CoV-2/FLU/RSV testing.  Fact Sheet for Patients: EntrepreneurPulse.com.au  Fact Sheet for Healthcare Providers: IncredibleEmployment.be  This test is not yet approved or cleared by the [place] FDA and has been authorized for detection and/or diagnosis of SARS-CoV-2 by FDA under an Emergency Use Authorization (EUA). This EUA will remain in effect (meaning this test can be used) for the duration of the COVID-19 declaration under Section 564(b)(1) of the Act, 21 U.S.C. section 360bbb-3(b)(1), unless the authorization is terminated or revoked.  Performed at [facility], [address], [place], [place] [address]      Radiology Studies: DG Chest 2 View  Result Date: 08/18/2020 CLINICAL DATA:  Worsening shortness of breath over the past 2 days. EXAM: CHEST - 2 VIEW COMPARISON:  Left rib x-rays dated January 29, 2007. FINDINGS: The heart size and mediastinal contours are within normal limits. Both lungs are clear. The visualized skeletal structures are unremarkable. Mild elevation of the right hemidiaphragm. IMPRESSION: 1. No acute cardiopulmonary disease. Electronically Signed   By: Titus Dubin M.D.   On: 08/18/2020 13:15   CT Angio Chest PE W and/or Wo Contrast  Result Date: 08/18/2020 CLINICAL DATA:  Pt to ED via [facility] from home for shortness of breath. Pt states that this started 1.5 days ago and has gotten worse. Pt states that she has not been to the doctor in about 7 to 10 years. States that she does not have any medical history that she is aware of. Pt is taking rapid, shallow breaths in triage. Pt SpO2 94% on room air. Pt states that shortness of breath is worse when she is up and moving around. Pt breathing is labored. EXAM: CT  ANGIOGRAPHY CHEST WITH CONTRAST TECHNIQUE: Multidetector CT imaging of the chest was performed using the standard protocol during bolus administration of intravenous contrast. Multiplanar CT image reconstructions and MIPs were obtained to evaluate the vascular anatomy. CONTRAST:  109mL OMNIPAQUE IOHEXOL 350 MG/ML SOLN COMPARISON:  Current chest radiograph. FINDINGS: Cardiovascular: There are significant bilateral pulmonary emboli. A saddle embolus extends across the posterior aspect of the main pulmonary artery into the right and left pulmonary arteries. Additional emboli noted in the distal right and left main pulmonary arteries, extending into the inter lobar pulmonary artery on the right and to into segmental branches to both upper and lower lobes and the right middle lobe. There is evidence of right heart strain  with the RV/LV ratio, 4.9 divided by 3.0 = 1.6. Heart is top-normal in size. No pericardial effusion. Aorta is normal in caliber. Mild aortic atherosclerotic calcifications across the arch and descending portions. Mediastinum/Nodes: No neck base, axillary, mediastinal or hilar masses or enlarged lymph nodes. Trachea and esophagus are unremarkable. Lungs/Pleura: Minor areas of relative increased attenuation consistent with vascular shunting. No evidence of pneumonia or pulmonary edema. No lung mass or nodule. No pleural effusion or pneumothorax. Upper Abdomen: No acute or significant abnormality. Musculoskeletal: No fracture or acute finding. No bone lesion. No chest wall masses. Review of the MIP images confirms the above findings. IMPRESSION: 1. Extensive bilateral pulmonary emboli as detailed above. Positive for acute PE with CTevidence of right heart strain (RV/LV Ratio = 1.6) consistent with at least submassive (intermediate risk) PE. The presence of right heart strain has been associated with an increased risk of morbidity and mortality. Critical Value/emergent results were called by telephone at the  time of interpretation on 08/18/2020 at 3:11 pm to provider [facility] , who verbally acknowledged these results. 2. No other acute abnormality. 3. Mild aortic atherosclerosis. Aortic Atherosclerosis (ICD10-I70.0). Electronically Signed   By: Lajean Manes M.D.   On: 08/18/2020 15:11   PERIPHERAL VASCULAR CATHETERIZATION  Result Date: 08/18/2020 See op note  US Venous Img Lower Unilateral Left (DVT)  Result Date: 08/18/2020 CLINICAL DATA:  Shortness of breath and lower extremity swelling. EXAM: LEFT LOWER EXTREMITY VENOUS DOPPLER ULTRASOUND TECHNIQUE: Gray-scale sonography with compression, as well as color and duplex ultrasound, were performed to evaluate the deep venous system(s) from the level of the common femoral vein through the popliteal and proximal calf veins. COMPARISON:  None. FINDINGS: VENOUS Normal compressibility of the LEFT common femoral and left superficial femoral veins. Abnormal compressibility is seen involving the LEFT popliteal vein, as well as the visualized LEFT calf veins. Visualized portions of profunda femoral vein and great saphenous vein unremarkable. Intraluminal filling defects suggestive of DVT are seen within the LEFT popliteal vein and LEFT calf veins on grayscale and color Doppler imaging. Abnormal Doppler waveforms are also seen within these regions. OTHER None. Limitations: none IMPRESSION: Findings consistent with nonocclusive DVT within the LEFT popliteal, LEFT posterior tibial and LEFT peroneal veins. Electronically Signed   By: Virgina Norfolk M.D.   On: 08/18/2020 21:53    Scheduled Meds: . influenza vaccine adjuvanted  0.5 mL Intramuscular Tomorrow-1000  . insulin aspart  0-15 Units Subcutaneous TID WC  . insulin aspart  0-5 Units Subcutaneous QHS  . pneumococcal 23 valent vaccine  0.5 mL Intramuscular Tomorrow-1000   Continuous Infusions: . sodium chloride 75 mL/hr at 08/19/20 1057  . heparin 1,300 Units/hr (08/19/20 1103)     LOS: 1 day   Time  spent: 45 minutes.  Lorella Nimrod, MD [facility]  If 7PM-7AM, please contact night-coverage [email or web address]  08/19/2020, 12:20 PM   This record has been created using Systems analyst. Errors have been sought and corrected,but may not always be located. Such creation errors do not reflect on the standard of care.

## 2020-08-19 NOTE — Progress Notes (Addendum)
ANTICOAGULATION CONSULT NOTE   Pharmacy Consult for Heparin Drip Indication: pulmonary embolus  No Known Allergies  Patient Measurements: Height: 4\' 10"  (147.3 cm) Weight: 112.4 kg (247 lb 11.2 oz) IBW/kg (Calculated) : 40.9 Heparin Dosing Weight: 69.8 kg  Vital Signs: Temp: 98.4 F (36.9 C) (02/12 0421) Temp Source: Oral (02/12 0421) BP: 106/80 (02/12 0421) Pulse Rate: 93 (02/12 0421)  Labs: Recent Labs    08/18/20 1159 08/18/20 2229 08/19/20 0303 08/19/20 1017  HGB 12.3  --  10.9*  --   HCT 39.7  --  34.9*  --   PLT 269  --  232  --   APTT  --  32  --   --   LABPROT  --  15.4*  --   --   INR  --  1.3*  --   --   HEPARINUNFRC  --  <0.10* 0.19* 0.78*  CREATININE 0.92  --  0.83  --   TROPONINIHS 73*  --   --   --     Estimated Creatinine Clearance: 74.1 mL/min (by C-G formula based on SCr of 0.83 mg/dL).   Medical History: History reviewed. No pertinent past medical history.  Medications:  Scheduled:  . influenza vaccine adjuvanted  0.5 mL Intramuscular Tomorrow-1000  . insulin aspart  0-15 Units Subcutaneous TID WC  . insulin aspart  0-5 Units Subcutaneous QHS  . pneumococcal 23 valent vaccine  0.5 mL Intramuscular Tomorrow-1000   Infusions:  . sodium chloride 75 mL/hr at 08/18/20 2330  . heparin 1,400 Units/hr (08/19/20 0438)    Assessment: 65 yo F to be started on Heparin drip for bilateral PE w/ heart strain. No anticoag PTA per Med Rec. (Patient states hasnt seen a doctor in 10 yrs.) Hgb 12.3  plt 269  INR 1.3  aPTT 32  2/12: POD #1 s/p Suction thrombectomy of B pulmonary arteries, IVC filter placement   Goal of Therapy:  Heparin level 0.3-0.7 units/ml Monitor platelets by anticoagulation protocol: Yes   Plan:  2/12 1017 HL= 0.78. supratherapeutic. Will decrease heparin drip to 1300 units/hr Check HL in 6 hours Continue to monitor H&H and platelets.  *addendum: per Dr.Amin pt is transitioning off Heparin drip to Xarelto on 2/13  am  Chinita Greenland PharmD Clinical Pharmacist 08/19/2020

## 2020-08-19 NOTE — Progress Notes (Signed)
ANTICOAGULATION CONSULT NOTE - Initial Consult  Pharmacy Consult for Heparin Drip Indication: pulmonary embolus  No Known Allergies  Patient Measurements: Height: 4\' 10"  (147.3 cm) Weight: 113.4 kg (249 lb 15.7 oz) IBW/kg (Calculated) : 40.9 Heparin Dosing Weight: 69.8 kg  Vital Signs: Temp: 98.9 F (37.2 C) (02/11 2329) Temp Source: Oral (02/11 2329) BP: 121/84 (02/11 2329) Pulse Rate: 101 (02/11 2329)  Labs: Recent Labs    08/18/20 1159 08/18/20 2229 08/19/20 0303  HGB 12.3  --  10.9*  HCT 39.7  --  34.9*  PLT 269  --  232  APTT  --  32  --   LABPROT  --  15.4*  --   INR  --  1.3*  --   HEPARINUNFRC  --  <0.10* 0.19*  CREATININE 0.92  --  0.83  TROPONINIHS 73*  --   --     Estimated Creatinine Clearance: 74.6 mL/min (by C-G formula based on SCr of 0.83 mg/dL).   Medical History: History reviewed. No pertinent past medical history.  Medications:  Scheduled:  . influenza vaccine adjuvanted  0.5 mL Intramuscular Tomorrow-1000  . pneumococcal 23 valent vaccine  0.5 mL Intramuscular Tomorrow-1000   Infusions:  . sodium chloride 75 mL/hr at 08/18/20 2330  . heparin 1,200 Units/hr (08/18/20 2027)    Assessment: 65 yo F to be started on Heparin drip for bilateral PE w/ heart strain. No anticoag PTA per Med Rec. (Patient states hasnt seen a doctor in 10 yrs.) Hgb 12.3  plt 269  INR/aPTT pending   Goal of Therapy:  Heparin level 0.3-0.7 units/ml Monitor platelets by anticoagulation protocol: Yes   Plan:  Give heparin 2100 unit bolus Increase heparin infusion rate to 1400 units/hr Check HL in 6 hours Continue to monitor H&H and platelets.  Renda Rolls, PharmD, [facility] 08/19/2020 4:21 AM

## 2020-08-19 NOTE — Progress Notes (Signed)
[facility]  Daily Progress Note   Assessment/Planning:   POD #1 s/p Suction thrombectomy of B pulmonary arteries, IVC filter placement   Ok to remove the pressure dressing  No complications from procedure yesterday  Defer further mgmt of pt to primary team  Ideally anticoagulation for anticoagulation assisted autolysis would help with clearing pt's pulmonary clot load  IVC filter placed by Dr. Ronalee Belts in case pt is not a candidate for such  Ideally this filter should be removed in <6 months so follow up with Dr. Ronalee Belts within 3 months recommended   Subjective  - 1 Day Post-Op   No complaints, breathing better   Objective   Vitals:   08/19/20 0200 08/19/20 0300 08/19/20 0400 08/19/20 0421  BP: 129/77 119/83 119/76 106/80  Pulse: 91 96 91 93  Resp: (!) 26 (!) 25 (!) 25 (!) 21  Temp:    98.4 F (36.9 C)  TempSrc:    Oral  SpO2: 94% 93% 94% 93%  Weight:    112.4 kg  Height:         Intake/Output Summary (Last 24 hours) at 08/19/2020 2446 Last data filed at 08/19/2020 0000 Gross per 24 hour  Intake --  Output 0 ml  Net 0 ml    PULM  CTAB, somewhat tachypneic  CV  RRR  VASC R groin: pressure dressing, no active bleeding    Laboratory   CBC CBC Latest Ref Rng & Units 08/19/2020 08/18/2020  WBC 4.0 - 10.5 K/uL 9.1 8.8  Hemoglobin 12.0 - 15.0 g/dL 10.9(L) 12.3  Hematocrit 36.0 - 46.0 % 34.9(L) 39.7  Platelets 150 - 400 K/uL 232 269    BMET    Component Value Date/Time   NA 138 08/19/2020 0303   K 4.4 08/19/2020 0303   CL 106 08/19/2020 0303   CO2 22 08/19/2020 0303   GLUCOSE 320 (H) 08/19/2020 0303   BUN 18 08/19/2020 0303   CREATININE 0.83 08/19/2020 0303   CALCIUM 8.9 08/19/2020 0303   GFRNONAA >60 08/19/2020 0303     Adele Barthel, MD, FACS, FSVS Covering for [facility]: [phone]

## 2020-08-19 NOTE — Progress Notes (Signed)
ANTICOAGULATION CONSULT NOTE   Pharmacy Consult for Heparin Drip Indication: pulmonary embolus  No Known Allergies  Patient Measurements: Height: 4\' 10"  (147.3 cm) Weight: 112.4 kg (247 lb 11.2 oz) IBW/kg (Calculated) : 40.9 Heparin Dosing Weight: 69.8 kg  Vital Signs: Temp: 98.2 F (36.8 C) (02/12 1509) Temp Source: Oral (02/12 1509) BP: 123/76 (02/12 1509) Pulse Rate: 97 (02/12 1509)  Labs: Recent Labs    08/18/20 1159 08/18/20 2229 08/19/20 0303 08/19/20 1017  HGB 12.3  --  10.9*  --   HCT 39.7  --  34.9*  --   PLT 269  --  232  --   APTT  --  32  --   --   LABPROT  --  15.4*  --   --   INR  --  1.3*  --   --   HEPARINUNFRC  --  <0.10* 0.19* 0.78*  CREATININE 0.92  --  0.83  --   TROPONINIHS 73*  --   --   --     Estimated Creatinine Clearance: 74.1 mL/min (by C-G formula based on SCr of 0.83 mg/dL).   Medical History: History reviewed. No pertinent past medical history.  Medications:  Scheduled:  . influenza vaccine adjuvanted  0.5 mL Intramuscular Tomorrow-1000  . insulin aspart  0-15 Units Subcutaneous TID WC  . insulin aspart  0-5 Units Subcutaneous QHS  . pneumococcal 23 valent vaccine  0.5 mL Intramuscular Tomorrow-1000  . [START ON 08/20/2020] Rivaroxaban  15 mg Oral BID WC   Followed by  . [START ON 09/10/2020] rivaroxaban  20 mg Oral Q supper   Infusions:  . sodium chloride 75 mL/hr at 08/19/20 1057  . heparin 1,300 Units/hr (08/19/20 1103)    Assessment: 65 yo F to be started on Heparin drip for bilateral PE w/ heart strain. No anticoag PTA per Med Rec. (Patient states hasnt seen a doctor in 10 yrs.) Hgb 12.3  plt 269  INR 1.3  aPTT 32  2/12: POD #1 s/p Suction thrombectomy of B pulmonary arteries, IVC filter placement  Date Time HL Rate/Comment 2/12 0303 0.19 1200 units/hr 2/12 1017 0.78 1400 units/hr 2/12 1743 0.77 1300 units/hr  Goal of Therapy:  Heparin level 0.3-0.7 units/ml Monitor platelets by anticoagulation protocol: Yes    Plan:  HL supratherapeutic (0.77). Will decrease heparin drip to 1200 units/hr Check HL in 6 hours Continue to monitor H&H and platelets.  per Dr.Amin pt is transitioning off Heparin drip to Xarelto on 2/13 am  Darnelle Bos, PharmD Clinical Pharmacist 08/19/2020

## 2020-08-19 NOTE — Progress Notes (Signed)
*  PRELIMINARY RESULTS* Echocardiogram 2D Echocardiogram has been performed.  Courtney Mack Courtney Mack 08/19/2020, 11:53 AM

## 2020-08-20 DIAGNOSIS — I2602 Saddle embolus of pulmonary artery with acute cor pulmonale: Secondary | ICD-10-CM | POA: Diagnosis not present

## 2020-08-20 DIAGNOSIS — J9601 Acute respiratory failure with hypoxia: Secondary | ICD-10-CM | POA: Diagnosis not present

## 2020-08-20 LAB — ECHOCARDIOGRAM COMPLETE
Area-P 1/2: 7.29 cm2
Height: 58 in
S' Lateral: 2.69 cm
Weight: 3963.2 oz

## 2020-08-20 LAB — LIPID PANEL
Cholesterol: 215 mg/dL — ABNORMAL HIGH (ref 0–200)
HDL: 27 mg/dL — ABNORMAL LOW (ref >40)
LDL Cholesterol: 157 mg/dL — ABNORMAL HIGH (ref 0–99)
Total CHOL/HDL Ratio: 8 RATIO
Triglycerides: 154 mg/dL — ABNORMAL HIGH (ref <150)
VLDL: 31 mg/dL (ref 0–40)

## 2020-08-20 LAB — GLUCOSE, CAPILLARY
Glucose-Capillary: 145 mg/dL — ABNORMAL HIGH (ref 70–99)
Glucose-Capillary: 146 mg/dL — ABNORMAL HIGH (ref 70–99)
Glucose-Capillary: 150 mg/dL — ABNORMAL HIGH (ref 70–99)
Glucose-Capillary: 156 mg/dL — ABNORMAL HIGH (ref 70–99)
Glucose-Capillary: 211 mg/dL — ABNORMAL HIGH (ref 70–99)

## 2020-08-20 LAB — CBC
HCT: 32.7 % — ABNORMAL LOW (ref 36.0–46.0)
Hemoglobin: 10.1 g/dL — ABNORMAL LOW (ref 12.0–15.0)
MCH: 25.8 pg — ABNORMAL LOW (ref 26.0–34.0)
MCHC: 30.9 g/dL (ref 30.0–36.0)
MCV: 83.6 fL (ref 80.0–100.0)
Platelets: 263 10*3/uL (ref 150–400)
RBC: 3.91 MIL/uL (ref 3.87–5.11)
RDW: 17.1 % — ABNORMAL HIGH (ref 11.5–15.5)
WBC: 14 10*3/uL — ABNORMAL HIGH (ref 4.0–10.5)
nRBC: 0.1 % (ref 0.0–0.2)

## 2020-08-20 LAB — HEPARIN LEVEL (UNFRACTIONATED)
Heparin Unfractionated: 0.69 IU/mL (ref 0.30–0.70)
Heparin Unfractionated: 0.66 IU/mL (ref 0.30–0.70)

## 2020-08-20 LAB — CREATININE, SERUM
Creatinine, Ser: 0.92 mg/dL (ref 0.44–1.00)
GFR, Estimated: >60 mL/min (ref >60)

## 2020-08-20 MED ORDER — ROSUVASTATIN CALCIUM 10 MG PO TABS
10.0000 mg | ORAL_TABLET | Freq: Every day | ORAL | Status: DC
  Administered 2020-08-20 – 2020-08-24 (×5): 10 mg via ORAL
  Filled 2020-08-20 (×5): qty 1

## 2020-08-20 MED ORDER — LOSARTAN POTASSIUM 25 MG PO TABS
25.0000 mg | ORAL_TABLET | Freq: Every day | ORAL | Status: DC
  Administered 2020-08-20 – 2020-08-24 (×5): 25 mg via ORAL
  Filled 2020-08-20 (×4): qty 1

## 2020-08-20 NOTE — Evaluation (Signed)
Physical Therapy Evaluation Patient Details Name: Courtney Mack MRN: [id number] DOB: 04-06-1956 Today's Date: 08/20/2020   History of Present Illness  presented to ER secondary to progressive SOB; admitted for management of acute hypoxic respiratory failure related to extensive bilat PE.  s/p thrombectomy/thromboylsis with IVC filter placement (08/18/20)  Clinical Impression  Upon evaluation, patient alert and oriented; follows commands and eager for [place] activities.  Endorses persistent SOB, but feels it improved since admission.  Bilat UE/LE strength and ROM grossly symmetrical and WFL; no focal weakness appreciated.  Currently requiring mod assist for bed mobility; heavy min assist for sit/stand, basic transfers and gait (10') with RW.  Demonstrates choppy stepping pattern; heavy reliance on RW.  Significant SOB with minimal activity, though difficulty with O2 reading due to grasp on RW.  Once signal identified, sats 87-88%, quickly recovering >90% with seated rest.  RN informed/aware of performance; left on RA end of session.  Activity tolerance very limited; unable to demonstrate ability to safely negotiate home environment at this time. Would benefit from skilled PT to address above deficits and promote optimal return to PLOF.; recommend transition to STR upon discharge from acute hospitalization.      Follow Up Recommendations SNF    Equipment Recommendations       Recommendations for Other Services       Precautions / Restrictions Precautions Precautions: Fall Restrictions Weight Bearing Restrictions: No      Mobility  Bed Mobility Overal bed mobility: Needs Assistance Bed Mobility: Supine to Sit     Supine to sit: Mod assist     General bed mobility comments: extensive assist for truncal elevation and anterior weight translation with movement transition    Transfers Overall transfer level: Needs assistance Equipment used: Rolling walker (2 wheeled) Transfers:  Sit to/from Stand Sit to Stand: Min assist         General transfer comment: prefers using one hand on RW, one hand holding/pulling on therapists UE ("my grandson does it this way sometimes").  Min cuing for foot placement and anterior weight translation with lift off  Ambulation/Gait Ambulation/Gait assistance: Min assist Gait Distance (Feet): 10 Feet Assistive device: Rolling walker (2 wheeled)       General Gait Details: choppy stepping pattern; heavy reliance on RW.  Significant SOB with minimal activity, though difficulty with O2 readin due to grasp on RW.  Once signal identified, sats 87-88%, quickly recovering >90% with seated rest  Stairs            Wheelchair Mobility    Modified Rankin (Stroke Patients Only)       Balance Overall balance assessment: Needs assistance Sitting-balance support: No upper extremity supported;Feet supported Sitting balance-Leahy Scale: Good     Standing balance support: Bilateral upper extremity supported Standing balance-Leahy Scale: Fair                               Pertinent Vitals/Pain Pain Assessment: No/denies pain    Home Living Family/patient expects to be discharged to:: Private residence Living Arrangements: Children Available Help at Discharge: Family;Available PRN/intermittently Type of Home: House Home Access: Ramped entrance     Home Layout: One level Home Equipment: Walker - 4 wheels      Prior Function Level of Independence: Needs assistance         Comments: Mod indep for ADLs and household distance mobilization with 4WRW; denies fall history; no home O2.  Family does  assist with household chores, mobility as needed.     Hand Dominance        Extremity/Trunk Assessment   Upper Extremity Assessment Upper Extremity Assessment: Overall WFL for tasks assessed    Lower Extremity Assessment Lower Extremity Assessment: Overall WFL for tasks assessed (grossly 4/5 throughout)        Communication   Communication: No difficulties  Cognition Arousal/Alertness: Awake/alert Behavior During Therapy: WFL for tasks assessed/performed Overall Cognitive Status: Within Functional Limits for tasks assessed                                        General Comments      Exercises Other Exercises Other Exercises: Sit/stand from edge of bed, recliner, min assist with RW.  Increased time/effort for anterior weight translation with movement transition; prefers holding to therapist with single UE for assist with lift off   Assessment/Plan    PT Assessment Patient needs continued PT services  PT Problem List Decreased strength;Decreased range of motion;Decreased activity tolerance;Decreased balance;Decreased mobility;Decreased coordination;Decreased cognition;Decreased knowledge of use of DME;Decreased safety awareness;Decreased knowledge of precautions;Cardiopulmonary status limiting activity;Decreased skin integrity;Obesity       PT Treatment Interventions DME instruction;Gait training;Functional mobility training;Therapeutic activities;Therapeutic exercise;Balance training;Patient/family education    PT Goals (Current goals can be found in the Care Plan section)  Acute Rehab PT Goals Patient Stated Goal: to get up to the chair PT Goal Formulation: With patient Time For Goal Achievement: 09/03/20 Potential to Achieve Goals: Good    Frequency Min 2X/week   Barriers to discharge        Co-evaluation               AM-PAC PT "6 Clicks" Mobility  Outcome Measure Help needed turning from your back to your side while in a flat bed without using bedrails?: None Help needed moving from lying on your back to sitting on the side of a flat bed without using bedrails?: A Lot Help needed moving to and from a bed to a chair (including a wheelchair)?: A Little Help needed standing up from a chair using your arms (e.g., wheelchair or bedside chair)?: A  Little Help needed to walk in hospital room?: A Little Help needed climbing 3-5 steps with a railing? : A Lot 6 Click Score: 17    End of Session Equipment Utilized During Treatment: Gait belt Activity Tolerance: Patient tolerated treatment well Patient left: in chair;with call bell/phone within reach;with chair alarm set Nurse Communication: Mobility status PT Visit Diagnosis: Muscle weakness (generalized) (M62.81);Difficulty in walking, not elsewhere classified (R26.2)    Time: 3300-7622 PT Time Calculation (min) (ACUTE ONLY): 32 min   Charges:   PT Evaluation $PT Eval Moderate Complexity: 1 Mod PT Treatments $Therapeutic Activity: 8-22 mins        Nitish Roes H. Owens Shark, PT, DPT, NCS 08/20/20, 4:33 PM [phone]

## 2020-08-20 NOTE — Progress Notes (Signed)
ANTICOAGULATION CONSULT NOTE   Pharmacy Consult for Heparin Drip Indication: pulmonary embolus  No Known Allergies  Patient Measurements: Height: 4\' 10"  (147.3 cm) Weight: 112.4 kg (247 lb 11.2 oz) IBW/kg (Calculated) : 40.9 Heparin Dosing Weight: 69.8 kg  Vital Signs: Temp: 98.4 F (36.9 C) (02/12 2008) Temp Source: Oral (02/12 2008) BP: 116/78 (02/12 2008) Pulse Rate: 94 (02/12 2008)  Labs: Recent Labs    08/18/20 1159 08/18/20 2229 08/18/20 2229 08/19/20 0303 08/19/20 1017 08/19/20 1743 08/20/20 0045  HGB 12.3  --   --  10.9*  --   --  10.1*  HCT 39.7  --   --  34.9*  --   --  32.7*  PLT 269  --   --  232  --   --  263  APTT  --  32  --   --   --   --   --   LABPROT  --  15.4*  --   --   --   --   --   INR  --  1.3*  --   --   --   --   --   HEPARINUNFRC  --  <0.10*   < > 0.19* 0.78* 0.77* 0.69  CREATININE 0.92  --   --  0.83  --   --  0.92  TROPONINIHS 73*  --   --   --   --   --   --    < > = values in this interval not displayed.    Estimated Creatinine Clearance: 66.9 mL/min (by C-G formula based on SCr of 0.92 mg/dL).   Medical History: History reviewed. No pertinent past medical history.  Medications:  Scheduled:  . influenza vaccine adjuvanted  0.5 mL Intramuscular Tomorrow-1000  . insulin aspart  0-15 Units Subcutaneous TID WC  . insulin aspart  0-5 Units Subcutaneous QHS  . pneumococcal 23 valent vaccine  0.5 mL Intramuscular Tomorrow-1000  . Rivaroxaban  15 mg Oral BID WC   Followed by  . [START ON 09/10/2020] rivaroxaban  20 mg Oral Q supper   Infusions:  . sodium chloride 75 mL/hr at 08/19/20 1057  . heparin 1,200 Units/hr (08/19/20 1841)    Assessment: 65 yo F to be started on Heparin drip for bilateral PE w/ heart strain. No anticoag PTA per Med Rec. (Patient states hasnt seen a doctor in 10 yrs.) Hgb 12.3  plt 269  INR 1.3  aPTT 32  2/12: POD #1 s/p Suction thrombectomy of B pulmonary arteries, IVC filter  placement  Date Time HL Rate/Comment 2/12 0303 0.19 1200 units/hr 2/12 1017 0.78 1400 units/hr 2/12 1743 0.77 1300 units/hr 2/13 0045 0.69 1200 units/hr, thera x 1  Goal of Therapy:  Heparin level 0.3-0.7 units/ml Monitor platelets by anticoagulation protocol: Yes   Plan:  Will continue heparin drip to 1200 units/hr Recheck HL in 6 hours Continue to monitor H&H and platelets.  per Dr.Amin pt is transitioning off Heparin drip to Xarelto on 2/13 am  Renda Rolls, PharmD, [facility] 08/20/2020 1:22 AM

## 2020-08-20 NOTE — Progress Notes (Signed)
ANTICOAGULATION CONSULT NOTE   Pharmacy Consult for Heparin Drip Indication: pulmonary embolus  No Known Allergies  Patient Measurements: Height: 4\' 10"  (147.3 cm) Weight: 110 kg (242 lb 9.6 oz) IBW/kg (Calculated) : 40.9 Heparin Dosing Weight: 69.8 kg  Vital Signs: Temp: 97.8 F (36.6 C) (02/13 0730) Temp Source: Oral (02/13 0730) BP: 136/81 (02/13 0730) Pulse Rate: 91 (02/13 0730)  Labs: Recent Labs    08/18/20 1159 08/18/20 2229 08/18/20 2229 08/19/20 0303 08/19/20 1017 08/19/20 1743 08/20/20 0045 08/20/20 0707  HGB 12.3  --   --  10.9*  --   --  10.1*  --   HCT 39.7  --   --  34.9*  --   --  32.7*  --   PLT 269  --   --  232  --   --  263  --   APTT  --  32  --   --   --   --   --   --   LABPROT  --  15.4*  --   --   --   --   --   --   INR  --  1.3*  --   --   --   --   --   --   HEPARINUNFRC  --  <0.10*   < > 0.19*   < > 0.77* 0.69 0.66  CREATININE 0.92  --   --  0.83  --   --  0.92  --   TROPONINIHS 73*  --   --   --   --   --   --   --    < > = values in this interval not displayed.    Estimated Creatinine Clearance: 65.9 mL/min (by C-G formula based on SCr of 0.92 mg/dL).   Medical History: History reviewed. No pertinent past medical history.  Medications:  Scheduled:  . influenza vaccine adjuvanted  0.5 mL Intramuscular Tomorrow-1000  . insulin aspart  0-15 Units Subcutaneous TID WC  . insulin aspart  0-5 Units Subcutaneous QHS  . pneumococcal 23 valent vaccine  0.5 mL Intramuscular Tomorrow-1000  . Rivaroxaban  15 mg Oral BID WC   Followed by  . [START ON 09/10/2020] rivaroxaban  20 mg Oral Q supper   Infusions:  . sodium chloride 75 mL/hr at 08/19/20 1057  . heparin 1,200 Units/hr (08/19/20 1841)    Assessment: 65 yo F to be started on Heparin drip for bilateral PE w/ heart strain. No anticoag PTA per Med Rec. (Patient states hasnt seen a doctor in 10 yrs.) Hgb 12.3  plt 269  INR 1.3  aPTT 32  2/12: POD #1 s/p Suction thrombectomy of B  pulmonary arteries, IVC filter placement  Date Time HL Rate/Comment 2/12 0303 0.19 1200 units/hr 2/12 1017 0.78 1400 units/hr 2/12 1743 0.77 1300 units/hr 2/13 0045 0.69 1200 units/hr, thera x 1 2/13  0707 0.66 Therapeutic- continue rate- transition to Xarelto today 2/13  Goal of Therapy:  Heparin level 0.3-0.7 units/ml Monitor platelets by anticoagulation protocol: Yes   Plan:  2/13 0707 HL= 0.66 Will continue heparin drip to 1200 units/hr Continue to monitor H&H and platelets. -per Dr.Amin pt is transitioning off Heparin drip to Xarelto on 2/13 am  Chinita Greenland PharmD Clinical Pharmacist 08/20/2020

## 2020-08-20 NOTE — Progress Notes (Signed)
PROGRESS NOTE    Courtney Mack  KXF:[id number] DOB: 08-Jun-1956 DOA: 08/18/2020 PCP: Patient, No Pcp Per   Brief Narrative: Taken from H&P. Courtney Mack is a 65 y.o. female with medical history significant for has not been to a doctor in greater than 10 years and currently not on any prescribed medications presents to the emergency department for chief concerns of worsening shortness of breath for the past 2 days.  No chest pain. No obvious VTE risk factor, no traveling or prolonged immobilization.  No history of active cancer. CTA with bilateral massive PE with right heart strain. Venous Doppler was positive for bilateral DVTs. She was taken to the OR by vascular surgeon for thrombectomy and thrombolysis, IVC filter was also placed. Patient is unvaccinated and Covid test came back negative. She will need outpatient oncologic work-up, as having some postmenopausal bleeding.  Subjective: Patient remained quite dyspneic which increased with minor exertion. Nursing concern about being very unsteady on feet. Normally walks with the help of walker at home. Daughter at bedside.  Assessment & Plan:   Principal Problem:   Pulmonary embolism (HCC) Active Problems:   Vaginal bleeding, abnormal   Obesity, morbid, BMI 50 or higher (HCC)   Acute respiratory failure with hypoxia (HCC)  Acute hypoxic respiratory failure secondary to massive bilateral PE with right heart strain.  S/p suction embolectomy and thrombolysis by vascular surgery.  IVC filter was placed.  Venous Doppler positive for bilateral LE DVTs.  Continue to require up to 4 L of oxygen, does not use oxygen at baseline.  Patient without any medical help for many years, no other obvious risk factor but did not had any cancer screening. Having some postmenopausal bleeding. -Start Xarelto. -Continue with supplemental oxygen-wean as tolerated. -Echocardiogram-normal EF with grade 1 diastolic dysfunction. -Patient needs oncology  work-up as an outpatient. -PT evaluation for concern of being unsteady on feet.  Hyperglycemia.  Blood sugar in 200s to 300s range.  No prior diagnosis of diabetes as she has not seen physician for a long time. -A1c at 6.6, which makes her at diabetic range. -Continue SSI -We will get benefit with starting of Metformin on discharge.  Dyslipidemia. Lipid profile with elevated total cholesterol, triglyceride and LDL and low HDL. -Start her on Crestor.  Elevated blood pressure. Blood pressure remained elevated, most likely has hypertension. -We will start her on low-dose losartan.  Stage III obesity. Body mass index is 50.7 kg/m.  That makes her high risk for diabetes, hypertension along with other comorbidities. She was counseled for weight loss and will need continuous counseling by PCP.  Objective: Vitals:   08/19/20 2008 08/20/20 0340 08/20/20 0730 08/20/20 1142  BP: 116/78 120/76 136/81   Pulse: 94 89 91   Resp: (!) 21 20 20 20   Temp: 98.4 F (36.9 C) (!) 97.5 F (36.4 C) 97.8 F (36.6 C)   TempSrc: Oral Oral Oral   SpO2: 96% 94% 95%   Weight:  110 kg    Height:        Intake/Output Summary (Last 24 hours) at 08/20/2020 1515 Last data filed at 08/20/2020 1426 Gross per 24 hour  Intake 2954.75 ml  Output 900 ml  Net 2054.75 ml   Filed Weights   08/18/20 1701 08/19/20 0421 08/20/20 0340  Weight: 113.4 kg 112.4 kg 110 kg    Examination:  General. Morbidly obese lady, appears little tachypneic. Pulmonary.  Lungs clear bilaterally, normal respiratory effort. CV. Sinus tachycardia Abdomen.  Soft, nontender, nondistended,  BS positive. CNS.  Alert and oriented x3.  No focal neurologic deficit. Extremities.  No edema, no cyanosis, pulses intact and symmetrical. Psychiatry.  Judgment and insight appears normal.    DVT prophylaxis: Xarelto Code Status: Full Family Communication: Daughter was updated at bedside. Disposition Plan:  Status is: Inpatient  Remains  inpatient appropriate because:Inpatient level of care appropriate due to severity of illness   Dispo: The patient is from: Home              Anticipated d/c is to: Home              Anticipated d/c date is: 1 day              Patient currently is not medically stable to d/c.   Difficult to place patient No              Level of care: Progressive Cardiac  Consultants:   Vascular surgery  Procedures:  Suction embolectomy. Thrombolysis IVC filter placement  Antimicrobials:   Data Reviewed: I have personally reviewed following labs and imaging studies  CBC: Recent Labs  Lab 08/18/20 1159 08/19/20 0303 08/20/20 0045  WBC 8.8 9.1 14.0*  NEUTROABS 6.5  --   --   HGB 12.3 10.9* 10.1*  HCT 39.7 34.9* 32.7*  MCV 83.2 83.1 83.6  PLT 269 232 253   Basic Metabolic Panel: Recent Labs  Lab 08/18/20 1159 08/19/20 0303 08/20/20 0045  NA 138 138  --   K 4.0 4.4  --   CL 102 106  --   CO2 24 22  --   GLUCOSE 225* 320*  --   BUN 16 18  --   CREATININE 0.92 0.83 0.92  CALCIUM 9.4 8.9  --    GFR: Estimated Creatinine Clearance: 65.9 mL/min (by C-G formula based on SCr of 0.92 mg/dL). Liver Function Tests: Recent Labs  Lab 08/18/20 1159  AST 23  ALT 11  ALKPHOS 70  BILITOT 0.9  PROT 7.8  ALBUMIN 3.5   No results for input(s): LIPASE, AMYLASE in the last 168 hours. No results for input(s): AMMONIA in the last 168 hours. Coagulation Profile: Recent Labs  Lab 08/18/20 2229  INR 1.3*   Cardiac Enzymes: No results for input(s): CKTOTAL, CKMB, CKMBINDEX, TROPONINI in the last 168 hours. BNP (last 3 results) No results for input(s): PROBNP in the last 8760 hours. HbA1C: Recent Labs    08/19/20 1017  HGBA1C 6.6*   CBG: Recent Labs  Lab 08/19/20 1700 08/19/20 2007 08/20/20 0018 08/20/20 0728 08/20/20 1142  GLUCAP 188* 218* 211* 146* 156*   Lipid Profile: Recent Labs    08/20/20 0045  CHOL 215*  HDL 27*  LDLCALC 157*  TRIG 154*  CHOLHDL 8.0    Thyroid Function Tests: Recent Labs    08/18/20 2229  TSH 3.425   Anemia Panel: No results for input(s): VITAMINB12, FOLATE, FERRITIN, TIBC, IRON, RETICCTPCT in the last 72 hours. Sepsis Labs: Recent Labs  Lab 08/18/20 1159  PROCALCITON <0.10    Recent Results (from the past 240 hour(s))  Resp Panel by RT-PCR (Flu A&B, Covid) Nasopharyngeal Swab     Status: None   Collection Time: 08/18/20  1:24 PM   Specimen: Nasopharyngeal Swab; Nasopharyngeal(NP) swabs in vial transport medium  Result Value Ref Range Status   SARS Coronavirus 2 by RT PCR NEGATIVE NEGATIVE Final    Comment: (NOTE) SARS-CoV-2 target nucleic acids are NOT DETECTED.  The SARS-CoV-2 RNA is generally detectable  in upper respiratory specimens during the acute phase of infection. The lowest concentration of SARS-CoV-2 viral copies this assay can detect is 138 copies/mL. A negative result does not preclude SARS-Cov-2 infection and should not be used as the sole basis for treatment or other patient management decisions. A negative result may occur with  improper specimen collection/handling, submission of specimen other than nasopharyngeal swab, presence of viral mutation(s) within the areas targeted by this assay, and inadequate number of viral copies(<138 copies/mL). A negative result must be combined with clinical observations, patient history, and epidemiological information. The expected result is Negative.  Fact Sheet for Patients:  EntrepreneurPulse.com.au  Fact Sheet for Healthcare Providers:  IncredibleEmployment.be  This test is no t yet approved or cleared by the [place] FDA and  has been authorized for detection and/or diagnosis of SARS-CoV-2 by FDA under an Emergency Use Authorization (EUA). This EUA will remain  in effect (meaning this test can be used) for the duration of the COVID-19 declaration under Section 564(b)(1) of the Act, 21 U.S.C.section  360bbb-3(b)(1), unless the authorization is terminated  or revoked sooner.       Influenza A by PCR NEGATIVE NEGATIVE Final   Influenza B by PCR NEGATIVE NEGATIVE Final    Comment: (NOTE) The Xpert Xpress SARS-CoV-2/FLU/RSV plus assay is intended as an aid in the diagnosis of influenza from Nasopharyngeal swab specimens and should not be used as a sole basis for treatment. Nasal washings and aspirates are unacceptable for Xpert Xpress SARS-CoV-2/FLU/RSV testing.  Fact Sheet for Patients: EntrepreneurPulse.com.au  Fact Sheet for Healthcare Providers: IncredibleEmployment.be  This test is not yet approved or cleared by the [place] FDA and has been authorized for detection and/or diagnosis of SARS-CoV-2 by FDA under an Emergency Use Authorization (EUA). This EUA will remain in effect (meaning this test can be used) for the duration of the COVID-19 declaration under Section 564(b)(1) of the Act, 21 U.S.C. section 360bbb-3(b)(1), unless the authorization is terminated or revoked.  Performed at [facility], [address] [place], [place] [address]      Radiology Studies: PERIPHERAL VASCULAR CATHETERIZATION  Result Date: 08/18/2020 See op note  US Venous Img Lower Unilateral Left (DVT)  Result Date: 08/18/2020 CLINICAL DATA:  Shortness of breath and lower extremity swelling. EXAM: LEFT LOWER EXTREMITY VENOUS DOPPLER ULTRASOUND TECHNIQUE: Gray-scale sonography with compression, as well as color and duplex ultrasound, were performed to evaluate the deep venous system(s) from the level of the common femoral vein through the popliteal and proximal calf veins. COMPARISON:  None. FINDINGS: VENOUS Normal compressibility of the LEFT common femoral and left superficial femoral veins. Abnormal compressibility is seen involving the LEFT popliteal vein, as well as the visualized LEFT calf veins. Visualized portions of profunda femoral vein  and great saphenous vein unremarkable. Intraluminal filling defects suggestive of DVT are seen within the LEFT popliteal vein and LEFT calf veins on grayscale and color Doppler imaging. Abnormal Doppler waveforms are also seen within these regions. OTHER None. Limitations: none IMPRESSION: Findings consistent with nonocclusive DVT within the LEFT popliteal, LEFT posterior tibial and LEFT peroneal veins. Electronically Signed   By: Virgina Norfolk M.D.   On: 08/18/2020 21:53   US Venous Img Lower Unilateral Right (DVT)  Result Date: 08/19/2020 CLINICAL DATA:  Pulmonary embolism EXAM: RIGHT LOWER EXTREMITY VENOUS DOPPLER ULTRASOUND TECHNIQUE: Gray-scale sonography with compression, as well as color and duplex ultrasound, were performed to evaluate the deep venous system(s) from the level of the common femoral vein  through the popliteal and proximal calf veins. COMPARISON:  None. FINDINGS: VENOUS Normal compressibility of the common femoral, superficial femoral, and popliteal veins, as well as the visualized calf veins. Visualized portions of profunda femoral vein and great saphenous vein unremarkable. No filling defects to suggest DVT on grayscale or color Doppler imaging. Doppler waveforms show normal direction of venous flow, normal respiratory plasticity and response to augmentation. Limited views of the contralateral common femoral vein are unremarkable. OTHER None. Limitations: none IMPRESSION: No evidence for DVT involving the right lower extremity. Please see prior left lower extremity report for further details of the left lower extremity. Electronically Signed   By: Constance Holster M.D.   On: 08/19/2020 14:55   ECHOCARDIOGRAM COMPLETE  Result Date: 08/20/2020    ECHOCARDIOGRAM REPORT   Patient Name:   Courtney Mack Date of Exam: 08/19/2020 Medical Rec #:  [id number]          Height:       58.0 in Accession #:    [id number]         Weight:       247.7 lb Date of Birth:  [birth year]          BSA:           1.994 m Patient Age:    84 years           BP:           130/77 mmHg Patient Gender: F                  HR:           99 bpm. Exam Location:  [facility] Procedure: 2D Echo Indications:     Pulmonary Embolus I26.99  History:         Patient has no prior history of Echocardiogram examinations.  Sonographer:     Arville Go RDCS Referring Phys:  [id number] AMY N COX Diagnosing Phys: Jenkins Rouge MD  Sonographer Comments: Technically difficult study due to poor echo windows and patient is morbidly obese. Image acquisition challenging due to patient body habitus and Image acquisition challenging due to respiratory motion. IMPRESSIONS  1. Left ventricular ejection fraction, by estimation, is 60 to 65%. The left ventricle has normal function. The left ventricle has no regional wall motion abnormalities. Left ventricular diastolic parameters are consistent with Grade I diastolic dysfunction (impaired relaxation).  2. Right ventricular systolic function is mildly reduced. The right ventricular size is mildly enlarged.  3. The mitral valve is normal in structure. No evidence of mitral valve regurgitation. No evidence of mitral stenosis.  4. The aortic valve is normal in structure. Aortic valve regurgitation is not visualized. No aortic stenosis is present.  5. The inferior vena cava is normal in size with greater than 50% respiratory variability, suggesting right atrial pressure of 3 mmHg. FINDINGS  Left Ventricle: Left ventricular ejection fraction, by estimation, is 60 to 65%. The left ventricle has normal function. The left ventricle has no regional wall motion abnormalities. The left ventricular internal cavity size was normal in size. There is  no left ventricular hypertrophy. Left ventricular diastolic parameters are consistent with Grade I diastolic dysfunction (impaired relaxation). Right Ventricle: The right ventricular size is mildly enlarged. No increase in right ventricular wall thickness. Right ventricular  systolic function is mildly reduced. Left Atrium: Left atrial size was normal in size. Right Atrium: Right atrial size was normal in size. Pericardium: There is no evidence of pericardial effusion. Mitral Valve:  The mitral valve is normal in structure. No evidence of mitral valve regurgitation. No evidence of mitral valve stenosis. Tricuspid Valve: The tricuspid valve is normal in structure. Tricuspid valve regurgitation is trivial. No evidence of tricuspid stenosis. Aortic Valve: The aortic valve is normal in structure. Aortic valve regurgitation is not visualized. No aortic stenosis is present. Pulmonic Valve: The pulmonic valve was normal in structure. Pulmonic valve regurgitation is not visualized. No evidence of pulmonic stenosis. Aorta: The aortic root is normal in size and structure. Venous: The inferior vena cava is normal in size with greater than 50% respiratory variability, suggesting right atrial pressure of 3 mmHg. IAS/Shunts: No atrial level shunt detected by color flow Doppler.  LEFT VENTRICLE PLAX 2D LVIDd:         3.81 cm  Diastology LVIDs:         2.69 cm  LV e' medial:   3.92 cm/s LV PW:         1.14 cm  LV E/e' medial: 11.7 LV IVS:        0.94 cm LVOT diam:     1.90 cm LV SV:         33 LV SV Index:   16 LVOT Area:     2.84 cm  RIGHT VENTRICLE RV Basal diam:  3.79 cm RV S prime:     11.10 cm/s LEFT ATRIUM           Index      RIGHT ATRIUM           Index LA diam:      3.20 cm 1.60 cm/m RA Area:     11.70 cm LA Vol (A4C): 19.9 ml 9.98 ml/m RA Volume:   27.40 ml  13.74 ml/m  AORTIC VALVE LVOT Vmax:   102.00 cm/s LVOT Vmean:  56.800 cm/s LVOT VTI:    0.115 m  AORTA Ao Root diam: 3.40 cm Ao Asc diam:  3.20 cm MITRAL VALVE MV Area (PHT): 7.29 cm    SHUNTS MV Decel Time: 104 msec    Systemic VTI:  0.12 m MV E velocity: 45.80 cm/s  Systemic Diam: 1.90 cm MV A velocity: 93.40 cm/s MV E/A ratio:  0.49 Jenkins Rouge MD Electronically signed by Jenkins Rouge MD Signature Date/Time: 08/20/2020/10:38:24 AM     Final     Scheduled Meds: . influenza vaccine adjuvanted  0.5 mL Intramuscular Tomorrow-1000  . insulin aspart  0-15 Units Subcutaneous TID WC  . insulin aspart  0-5 Units Subcutaneous QHS  . losartan  25 mg Oral Daily  . pneumococcal 23 valent vaccine  0.5 mL Intramuscular Tomorrow-1000  . Rivaroxaban  15 mg Oral BID WC   Followed by  . [START ON 09/10/2020] rivaroxaban  20 mg Oral Q supper  . rosuvastatin  10 mg Oral Daily   Continuous Infusions: . sodium chloride Stopped (08/20/20 1426)     LOS: 2 days   Time spent: 35 minutes.  Lorella Nimrod, MD [facility]  If 7PM-7AM, please contact night-coverage [email or web address]  08/20/2020, 3:15 PM   This record has been created using Systems analyst. Errors have been sought and corrected,but may not always be located. Such creation errors do not reflect on the standard of care.

## 2020-08-21 ENCOUNTER — Encounter: Payer: Self-pay | Admitting: Vascular Surgery

## 2020-08-21 DIAGNOSIS — J9601 Acute respiratory failure with hypoxia: Secondary | ICD-10-CM | POA: Diagnosis not present

## 2020-08-21 DIAGNOSIS — I2602 Saddle embolus of pulmonary artery with acute cor pulmonale: Secondary | ICD-10-CM | POA: Diagnosis not present

## 2020-08-21 LAB — GLUCOSE, CAPILLARY
Glucose-Capillary: 126 mg/dL — ABNORMAL HIGH (ref 70–99)
Glucose-Capillary: 136 mg/dL — ABNORMAL HIGH (ref 70–99)
Glucose-Capillary: 130 mg/dL — ABNORMAL HIGH (ref 70–99)
Glucose-Capillary: 117 mg/dL — ABNORMAL HIGH (ref 70–99)

## 2020-08-21 NOTE — Care Management Important Message (Signed)
Important Message  Patient Details  Name: Courtney Mack MRN: [id number] Date of Birth: [birth year]   Medicare Important Message Given:  Yes     [name] 08/21/2020, 12:11 PM

## 2020-08-21 NOTE — Progress Notes (Signed)
[facility] Daily Progress Note   Subjective: 08/18/20:             1.  Selective injection right subsegmental pulmonary arteries             2.  Selective injection left subsegmental pulmonary artery             3.  Infusion of TPA for thrombolysis             4.  Mechanical thrombectomy pulmonary emboli using deep number device             5.  Placement of infrarenal inferior vena cava filter  Patient without complaint this AM.  Notes for shortness of breath-improved.  Objective: Vitals:   08/20/20 1935 08/21/20 0432 08/21/20 0813 08/21/20 1134  BP: 131/71 94/64 118/67 (!) 107/54  Pulse: 95 86 83 87  Resp: 19 16 (!) 22 (!) 24  Temp: 97.6 F (36.4 C) 97.8 F (36.6 C) 97.6 F (36.4 C) 97.8 F (36.6 C)  TempSrc: Oral Oral Oral Oral  SpO2: 94% 97% 97% 97%  Weight:  110.2 kg    Height:        Intake/Output Summary (Last 24 hours) at 08/21/2020 1243 Last data filed at 08/20/2020 1815 Gross per 24 hour  Intake 633.97 ml  Output 250 ml  Net 383.97 ml   Physical Exam: A&Ox3, NAD CV: RRR Pulmonary: CTA Bilaterally, nonlabored Abdomen: Soft, Nontender, Nondistended Right groin access site:  Clean, dry intact.  No swelling or drainage. Vascular: Warm distally toes   Laboratory: CBC    Component Value Date/Time   WBC 14.0 (H) 08/20/2020 0045   HGB 10.1 (L) 08/20/2020 0045   HCT 32.7 (L) 08/20/2020 0045   PLT 263 08/20/2020 0045   BMET    Component Value Date/Time   NA 138 08/19/2020 0303   K 4.4 08/19/2020 0303   CL 106 08/19/2020 0303   CO2 22 08/19/2020 0303   GLUCOSE 320 (H) 08/19/2020 0303   BUN 18 08/19/2020 0303   CREATININE 0.92 08/20/2020 0045   CALCIUM 8.9 08/19/2020 0303   GFRNONAA >60 08/20/2020 0045   Assessment/Planning: The patient is a 65 year old female who presented with progressively worsening shortness of breath now status post pulmonary thrombectomy/thrombolysis  1) both the patient's physical exam and symptoms have  improved status post pulmonary intervention 2) transition from heparin to Xarelto 3) status post IVC filter placement 4) okay from a vascular standpoint to be discharged home when medically stable  Discussed with Dr. Eber Hong Courtney Fehr PA-C 08/21/2020 12:43 PM

## 2020-08-21 NOTE — Progress Notes (Signed)
O2 sat 85%, O2 placed on pt and at 2 liters [place], O2 sat increased to 98%.  Will leave on during sleeping hours.

## 2020-08-21 NOTE — Progress Notes (Addendum)
Physical Therapy Treatment Patient Details Name: Courtney Mack MRN: [id number] DOB: 1955/09/30 Today's Date: 08/21/2020    History of Present Illness presented to ER secondary to progressive SOB; admitted for management of acute hypoxic respiratory failure related to extensive bilat PE.  s/p thrombectomy/thromboylsis with IVC filter placement (08/18/20)    PT Comments    Progressive increase in gait distance, but remains unable to complete full, household-distance due to fatigue/SOB with exertion.  Gait pattern demonstrates decreased step height/length, decreased cadence and gait speed; heavy reliance on RW.  slow and deliberate, limited balance reactions noted.  Mod SOB, sats 88-89% on RA, quickly recovers >90% with seated rest (within 10-15 seconds) Patient very motivated and eager to progress as able; however, requires frequent and extended rest periods with all activities at this time.  SaO2 on room air at rest = 93% SaO2 on room air while ambulating = 88-89%, quickly recovers >90% within 10-15 seconds seated rest      Follow Up Recommendations  SNF     Equipment Recommendations       Recommendations for Other Services       Precautions / Restrictions Precautions Precautions: Fall Restrictions Weight Bearing Restrictions: No    Mobility  Bed Mobility Overal bed mobility: Needs Assistance Bed Mobility: Supine to Sit     Supine to sit: Min assist          Transfers Overall transfer level: Needs assistance Equipment used: Rolling walker (2 wheeled) Transfers: Sit to/from Stand Sit to Stand: Min assist         General transfer comment: single UE pulling on RW, single UE holding to therapist for lift off  Ambulation/Gait Ambulation/Gait assistance: Min assist Gait Distance (Feet):  (25' x2) Assistive device: Rolling walker (2 wheeled)       General Gait Details: decreased step height/length, decreased cadence and gait speed; heavy reliance on RW.   slow and deliberate, limited balance reactions noted.  Mod SOB, sats 88-89% on RA, quickly recovers >90% with seated rest (within 10-15 seconds)   Stairs             Wheelchair Mobility    Modified Rankin (Stroke Patients Only)       Balance Overall balance assessment: Needs assistance Sitting-balance support: No upper extremity supported;Feet supported Sitting balance-Leahy Scale: Good     Standing balance support: Bilateral upper extremity supported Standing balance-Leahy Scale: Fair                              Cognition Arousal/Alertness: Awake/alert Behavior During Therapy: WFL for tasks assessed/performed Overall Cognitive Status: Within Functional Limits for tasks assessed                                        Exercises      General Comments        Pertinent Vitals/Pain Pain Assessment: No/denies pain    Home Living                      Prior Function            PT Goals (current goals can now be found in the care plan section) Acute Rehab PT Goals Patient Stated Goal: to get up to the chair PT Goal Formulation: With patient Time For Goal Achievement: 09/03/20 Potential to Achieve Goals:  Good Progress towards PT goals: Progressing toward goals    Frequency    Min 2X/week      PT Plan Current plan remains appropriate    Co-evaluation              AM-PAC PT "6 Clicks" Mobility   Outcome Measure  Help needed turning from your back to your side while in a flat bed without using bedrails?: None Help needed moving from lying on your back to sitting on the side of a flat bed without using bedrails?: A Little Help needed moving to and from a bed to a chair (including a wheelchair)?: A Little Help needed standing up from a chair using your arms (e.g., wheelchair or bedside chair)?: A Little Help needed to walk in hospital room?: A Little Help needed climbing 3-5 steps with a railing? : A Lot 6  Click Score: 18    End of Session Equipment Utilized During Treatment: Gait belt Activity Tolerance: Patient tolerated treatment well Patient left: in chair;with call bell/phone within reach;with chair alarm set Nurse Communication: Mobility status PT Visit Diagnosis: Muscle weakness (generalized) (M62.81);Difficulty in walking, not elsewhere classified (R26.2)     Time: 1451-1520 PT Time Calculation (min) (ACUTE ONLY): 29 min  Charges:  $Gait Training: 8-22 mins $Therapeutic Activity: 8-22 mins                    Elira Colasanti H. Owens Shark, PT, DPT, NCS 08/21/20, 3:57 PM [phone]

## 2020-08-21 NOTE — Progress Notes (Signed)
PROGRESS NOTE    Courtney Mack  KCL:[id number] DOB: 1955/12/31 DOA: 08/18/2020 PCP: Patient, No Pcp Per   Brief Narrative: Taken from H&P. AANSHI Mack is a 65 y.o. female with medical history significant for has not been to a doctor in greater than 10 years and currently not on any prescribed medications presents to the emergency department for chief concerns of worsening shortness of breath for the past 2 days.  No chest pain. No obvious VTE risk factor, no traveling or prolonged immobilization.  No history of active cancer. CTA with bilateral massive PE with right heart strain. Venous Doppler was positive for bilateral DVTs. She was taken to the OR by vascular surgeon for thrombectomy and thrombolysis, IVC filter was also placed. Patient is unvaccinated and Covid test came back negative. She will need outpatient oncologic work-up, as having some postmenopausal bleeding.  Subjective: Patient was resting comfortably when seen today.  Stating that she is feeling much better.  Agreeable to go to SNF as recommended by PT.  Assessment & Plan:   Principal Problem:   Pulmonary embolism (HCC) Active Problems:   Vaginal bleeding, abnormal   Obesity, morbid, BMI 50 or higher (HCC)   Acute respiratory failure with hypoxia (HCC)  Acute hypoxic respiratory failure secondary to massive bilateral PE with right heart strain.  S/p suction embolectomy and thrombolysis by vascular surgery.  IVC filter was placed.  Venous Doppler positive for bilateral LE DVTs.  Continue to require up to 4 L of oxygen, does not use oxygen at baseline.  Patient without any medical help for many years, no other obvious risk factor but did not had any cancer screening. Having some postmenopausal bleeding. -Continue Xarelto. -Continue with supplemental oxygen-wean as tolerated. -Echocardiogram-normal EF with grade 1 diastolic dysfunction. -Patient needs oncology work-up as an outpatient. -PT evaluation for  concern of being unsteady on feet-recommending SNF placement. -TOC consult for SNF  Hyperglycemia.  Blood sugar in 200s to 300s range.  No prior diagnosis of diabetes as she has not seen physician for a long time. -A1c at 6.6, which makes her at diabetic range. -Continue SSI -We will get benefit with starting of Metformin on discharge.  Dyslipidemia. Lipid profile with elevated total cholesterol, triglyceride and LDL and low HDL. -Start her on Crestor.  Elevated blood pressure.  Blood pressure improved after starting low-dose losartan -Continue losartan at 25 mg daily  Stage III obesity. Body mass index is 50.79 kg/m.  That makes her high risk for diabetes, hypertension along with other comorbidities. She was counseled for weight loss and will need continuous counseling by PCP.  Objective: Vitals:   08/21/20 0813 08/21/20 1134 08/21/20 1456 08/21/20 1555  BP: 118/67 (!) 107/54 112/67 103/86  Pulse: 83 87 84 90  Resp: (!) 22 (!) 24 20 20   Temp: 97.6 F (36.4 C) 97.8 F (36.6 C) 98.2 F (36.8 C) 97.7 F (36.5 C)  TempSrc: Oral Oral Oral Oral  SpO2: 97% 97% 97% 93%  Weight:      Height:        Intake/Output Summary (Last 24 hours) at 08/21/2020 1603 Last data filed at 08/20/2020 1815 Gross per 24 hour  Intake 240 ml  Output 250 ml  Net -10 ml   Filed Weights   08/19/20 0421 08/20/20 0340 08/21/20 0432  Weight: 112.4 kg 110 kg 110.2 kg    Examination:  General.  Well-developed obese lady, in no acute distress. Pulmonary.  Lungs clear bilaterally, normal respiratory effort. CV.  Regular rate and rhythm, no JVD, rub or murmur. Abdomen.  Soft, nontender, nondistended, BS positive. CNS.  Alert and oriented x3.  No focal neurologic deficit. Extremities.  No edema, no cyanosis, pulses intact and symmetrical. Psychiatry.  Judgment and insight appears normal.   DVT prophylaxis: Xarelto Code Status: Full Family Communication: Discussed with patient Disposition Plan:   Status is: Inpatient  Remains inpatient appropriate because:Inpatient level of care appropriate due to severity of illness   Dispo: The patient is from: Home              Anticipated d/c is to: SNF              Anticipated d/c date is: 1 day              Patient currently is medically stable.   Difficult to place patient No              Level of care: Med-Surg  Consultants:   Vascular surgery  Procedures:  Suction embolectomy. Thrombolysis IVC filter placement  Antimicrobials:   Data Reviewed: I have personally reviewed following labs and imaging studies  CBC: Recent Labs  Lab 08/18/20 1159 08/19/20 0303 08/20/20 0045  WBC 8.8 9.1 14.0*  NEUTROABS 6.5  --   --   HGB 12.3 10.9* 10.1*  HCT 39.7 34.9* 32.7*  MCV 83.2 83.1 83.6  PLT 269 232 852   Basic Metabolic Panel: Recent Labs  Lab 08/18/20 1159 08/19/20 0303 08/20/20 0045  NA 138 138  --   K 4.0 4.4  --   CL 102 106  --   CO2 24 22  --   GLUCOSE 225* 320*  --   BUN 16 18  --   CREATININE 0.92 0.83 0.92  CALCIUM 9.4 8.9  --    GFR: Estimated Creatinine Clearance: 66 mL/min (by C-G formula based on SCr of 0.92 mg/dL). Liver Function Tests: Recent Labs  Lab 08/18/20 1159  AST 23  ALT 11  ALKPHOS 70  BILITOT 0.9  PROT 7.8  ALBUMIN 3.5   No results for input(s): LIPASE, AMYLASE in the last 168 hours. No results for input(s): AMMONIA in the last 168 hours. Coagulation Profile: Recent Labs  Lab 08/18/20 2229  INR 1.3*   Cardiac Enzymes: No results for input(s): CKTOTAL, CKMB, CKMBINDEX, TROPONINI in the last 168 hours. BNP (last 3 results) No results for input(s): PROBNP in the last 8760 hours. HbA1C: Recent Labs    08/19/20 1017  HGBA1C 6.6*   CBG: Recent Labs  Lab 08/20/20 1634 08/20/20 2103 08/21/20 0814 08/21/20 1134 08/21/20 1601  GLUCAP 150* 145* 126* 136* 130*   Lipid Profile: Recent Labs    08/20/20 0045  CHOL 215*  HDL 27*  LDLCALC 157*  TRIG 154*  CHOLHDL 8.0    Thyroid Function Tests: Recent Labs    08/18/20 2229  TSH 3.425   Anemia Panel: No results for input(s): VITAMINB12, FOLATE, FERRITIN, TIBC, IRON, RETICCTPCT in the last 72 hours. Sepsis Labs: Recent Labs  Lab 08/18/20 1159  PROCALCITON <0.10    Recent Results (from the past 240 hour(s))  Resp Panel by RT-PCR (Flu A&B, Covid) Nasopharyngeal Swab     Status: None   Collection Time: 08/18/20  1:24 PM   Specimen: Nasopharyngeal Swab; Nasopharyngeal(NP) swabs in vial transport medium  Result Value Ref Range Status   SARS Coronavirus 2 by RT PCR NEGATIVE NEGATIVE Final    Comment: (NOTE) SARS-CoV-2 target nucleic acids are NOT DETECTED.  The SARS-CoV-2 RNA is generally detectable in upper respiratory specimens during the acute phase of infection. The lowest concentration of SARS-CoV-2 viral copies this assay can detect is 138 copies/mL. A negative result does not preclude SARS-Cov-2 infection and should not be used as the sole basis for treatment or other patient management decisions. A negative result may occur with  improper specimen collection/handling, submission of specimen other than nasopharyngeal swab, presence of viral mutation(s) within the areas targeted by this assay, and inadequate number of viral copies(<138 copies/mL). A negative result must be combined with clinical observations, patient history, and epidemiological information. The expected result is Negative.  Fact Sheet for Patients:  EntrepreneurPulse.com.au  Fact Sheet for Healthcare Providers:  IncredibleEmployment.be  This test is no t yet approved or cleared by the [place] FDA and  has been authorized for detection and/or diagnosis of SARS-CoV-2 by FDA under an Emergency Use Authorization (EUA). This EUA will remain  in effect (meaning this test can be used) for the duration of the COVID-19 declaration under Section 564(b)(1) of the Act, 21 U.S.C.section  360bbb-3(b)(1), unless the authorization is terminated  or revoked sooner.       Influenza A by PCR NEGATIVE NEGATIVE Final   Influenza B by PCR NEGATIVE NEGATIVE Final    Comment: (NOTE) The Xpert Xpress SARS-CoV-2/FLU/RSV plus assay is intended as an aid in the diagnosis of influenza from Nasopharyngeal swab specimens and should not be used as a sole basis for treatment. Nasal washings and aspirates are unacceptable for Xpert Xpress SARS-CoV-2/FLU/RSV testing.  Fact Sheet for Patients: EntrepreneurPulse.com.au  Fact Sheet for Healthcare Providers: IncredibleEmployment.be  This test is not yet approved or cleared by the [place] FDA and has been authorized for detection and/or diagnosis of SARS-CoV-2 by FDA under an Emergency Use Authorization (EUA). This EUA will remain in effect (meaning this test can be used) for the duration of the COVID-19 declaration under Section 564(b)(1) of the Act, 21 U.S.C. section 360bbb-3(b)(1), unless the authorization is terminated or revoked.  Performed at [facility], [address], [place], [place] [address]      Radiology Studies: No results found.  Scheduled Meds: . insulin aspart  0-15 Units Subcutaneous TID WC  . insulin aspart  0-5 Units Subcutaneous QHS  . losartan  25 mg Oral Daily  . Rivaroxaban  15 mg Oral BID WC   Followed by  . [START ON 09/10/2020] rivaroxaban  20 mg Oral Q supper  . rosuvastatin  10 mg Oral Daily   Continuous Infusions: . sodium chloride Stopped (08/20/20 1426)     LOS: 3 days   Time spent: 25 minutes.  Lorella Nimrod, MD [facility]  If 7PM-7AM, please contact night-coverage [email or web address]  08/21/2020, 4:03 PM   This record has been created using Systems analyst. Errors have been sought and corrected,but may not always be located. Such creation errors do not reflect on the standard of care.

## 2020-08-22 DIAGNOSIS — I2602 Saddle embolus of pulmonary artery with acute cor pulmonale: Secondary | ICD-10-CM | POA: Diagnosis not present

## 2020-08-22 DIAGNOSIS — J9601 Acute respiratory failure with hypoxia: Secondary | ICD-10-CM | POA: Diagnosis not present

## 2020-08-22 LAB — GLUCOSE, CAPILLARY
Glucose-Capillary: 119 mg/dL — ABNORMAL HIGH (ref 70–99)
Glucose-Capillary: 155 mg/dL — ABNORMAL HIGH (ref 70–99)
Glucose-Capillary: 172 mg/dL — ABNORMAL HIGH (ref 70–99)

## 2020-08-22 NOTE — Progress Notes (Signed)
PROGRESS NOTE    Courtney Mack  VQQ:[id number] DOB: 10/20/1955 DOA: 08/18/2020 PCP: Patient, No Pcp Per   Brief Narrative: Taken from H&P. Courtney Mack is a 65 y.o. female with medical history significant for has not been to a doctor in greater than 10 years and currently not on any prescribed medications presents to the emergency department for chief concerns of worsening shortness of breath for the past 2 days.  No chest pain. No obvious VTE risk factor, no traveling or prolonged immobilization.  No history of active cancer. CTA with bilateral massive PE with right heart strain. Venous Doppler was positive for bilateral DVTs. She was taken to the OR by vascular surgeon for thrombectomy and thrombolysis, IVC filter was also placed. Patient is unvaccinated and Covid test came back negative. She will need outpatient oncologic work-up, as having some postmenopausal bleeding. PT recommending SNF-waiting for placement  Subjective: Patient has no new complaint today.  Waiting for rehab.  Assessment & Plan:   Principal Problem:   Pulmonary embolism (HCC) Active Problems:   Vaginal bleeding, abnormal   Obesity, morbid, BMI 50 or higher (HCC)   Acute respiratory failure with hypoxia (HCC)  Acute hypoxic respiratory failure secondary to massive bilateral PE with right heart strain.  S/p suction embolectomy and thrombolysis by vascular surgery.  IVC filter was placed.  Venous Doppler positive for bilateral LE DVTs.  Continue to require up to 4 L of oxygen, does not use oxygen at baseline.  Patient without any medical help for many years, no other obvious risk factor but did not had any cancer screening. Having some postmenopausal bleeding. -Continue Xarelto. -Continue with supplemental oxygen as needed. -Echocardiogram-normal EF with grade 1 diastolic dysfunction. -Patient needs oncology work-up as an outpatient. -PT evaluation for concern of being unsteady on feet-recommending  SNF placement. -TOC consult for SNF  Hyperglycemia.  Blood sugar was 200s to 300s range on admission which improved with SSI.  No prior diagnosis of diabetes as she has not seen physician for a long time. -A1c at 6.6, which makes her at diabetic range. -Continue SSI -We will get benefit with starting of Metformin on discharge.  Dyslipidemia. Lipid profile with elevated total cholesterol, triglyceride and LDL and low HDL. -Started her on Crestor.  Elevated blood pressure.  Blood pressure improved after starting low-dose losartan -Continue losartan at 25 mg daily  Stage III obesity. Body mass index is 50.79 kg/m.  That makes her high risk for diabetes, hypertension along with other comorbidities. She was counseled for weight loss and will need continuous counseling by PCP.  Objective: Vitals:   08/21/20 2008 08/22/20 0149 08/22/20 0446 08/22/20 0824  BP: (!) 108/57 123/62 116/63 131/82  Pulse: 87 77 88 82  Resp: (!) 21 18 19 15   Temp: 97.7 F (36.5 C) (!) 97.5 F (36.4 C) 97.7 F (36.5 C) 98 F (36.7 C)  TempSrc: Oral Oral  Oral  SpO2: 91% 93% 91% 93%  Weight:      Height:        Intake/Output Summary (Last 24 hours) at 08/22/2020 1402 Last data filed at 08/22/2020 1350 Gross per 24 hour  Intake 480 ml  Output 1000 ml  Net -520 ml   Filed Weights   08/19/20 0421 08/20/20 0340 08/21/20 0432  Weight: 112.4 kg 110 kg 110.2 kg    Examination:  General.  Morbidly obese lady, in no acute distress. Pulmonary.  Lungs clear bilaterally, normal respiratory effort. CV.  Regular rate and rhythm,  no JVD, rub or murmur. Abdomen.  Soft, nontender, nondistended, BS positive. CNS.  Alert and oriented x3.  No focal neurologic deficit. Extremities.  No edema, no cyanosis, pulses intact and symmetrical. Psychiatry.  Judgment and insight appears normal.   DVT prophylaxis: Xarelto Code Status: Full Family Communication: Discussed with patient Disposition Plan:  Status is:  Inpatient  Remains inpatient appropriate because:Inpatient level of care appropriate due to severity of illness   Dispo: The patient is from: Home              Anticipated d/c is to: SNF              Anticipated d/c date is: 1 day              Patient currently is medically stable.   Difficult to place patient No              Level of care: Med-Surg  Consultants:   Vascular surgery  Procedures:  Suction embolectomy. Thrombolysis IVC filter placement  Antimicrobials:   Data Reviewed: I have personally reviewed following labs and imaging studies  CBC: Recent Labs  Lab 08/18/20 1159 08/19/20 0303 08/20/20 0045  WBC 8.8 9.1 14.0*  NEUTROABS 6.5  --   --   HGB 12.3 10.9* 10.1*  HCT 39.7 34.9* 32.7*  MCV 83.2 83.1 83.6  PLT 269 232 177   Basic Metabolic Panel: Recent Labs  Lab 08/18/20 1159 08/19/20 0303 08/20/20 0045  NA 138 138  --   K 4.0 4.4  --   CL 102 106  --   CO2 24 22  --   GLUCOSE 225* 320*  --   BUN 16 18  --   CREATININE 0.92 0.83 0.92  CALCIUM 9.4 8.9  --    GFR: Estimated Creatinine Clearance: 66 mL/min (by C-G formula based on SCr of 0.92 mg/dL). Liver Function Tests: Recent Labs  Lab 08/18/20 1159  AST 23  ALT 11  ALKPHOS 70  BILITOT 0.9  PROT 7.8  ALBUMIN 3.5   No results for input(s): LIPASE, AMYLASE in the last 168 hours. No results for input(s): AMMONIA in the last 168 hours. Coagulation Profile: Recent Labs  Lab 08/18/20 2229  INR 1.3*   Cardiac Enzymes: No results for input(s): CKTOTAL, CKMB, CKMBINDEX, TROPONINI in the last 168 hours. BNP (last 3 results) No results for input(s): PROBNP in the last 8760 hours. HbA1C: No results for input(s): HGBA1C in the last 72 hours. CBG: Recent Labs  Lab 08/21/20 0814 08/21/20 1134 08/21/20 1601 08/21/20 2009 08/22/20 0823  GLUCAP 126* 136* 130* 117* 119*   Lipid Profile: Recent Labs    08/20/20 0045  CHOL 215*  HDL 27*  LDLCALC 157*  TRIG 154*  CHOLHDL 8.0    Thyroid Function Tests: No results for input(s): TSH, T4TOTAL, FREET4, T3FREE, THYROIDAB in the last 72 hours. Anemia Panel: No results for input(s): VITAMINB12, FOLATE, FERRITIN, TIBC, IRON, RETICCTPCT in the last 72 hours. Sepsis Labs: Recent Labs  Lab 08/18/20 1159  PROCALCITON <0.10    Recent Results (from the past 240 hour(s))  Resp Panel by RT-PCR (Flu A&B, Covid) Nasopharyngeal Swab     Status: None   Collection Time: 08/18/20  1:24 PM   Specimen: Nasopharyngeal Swab; Nasopharyngeal(NP) swabs in vial transport medium  Result Value Ref Range Status   SARS Coronavirus 2 by RT PCR NEGATIVE NEGATIVE Final    Comment: (NOTE) SARS-CoV-2 target nucleic acids are NOT DETECTED.  The SARS-CoV-2 RNA  is generally detectable in upper respiratory specimens during the acute phase of infection. The lowest concentration of SARS-CoV-2 viral copies this assay can detect is 138 copies/mL. A negative result does not preclude SARS-Cov-2 infection and should not be used as the sole basis for treatment or other patient management decisions. A negative result may occur with  improper specimen collection/handling, submission of specimen other than nasopharyngeal swab, presence of viral mutation(s) within the areas targeted by this assay, and inadequate number of viral copies(<138 copies/mL). A negative result must be combined with clinical observations, patient history, and epidemiological information. The expected result is Negative.  Fact Sheet for Patients:  EntrepreneurPulse.com.au  Fact Sheet for Healthcare Providers:  IncredibleEmployment.be  This test is no t yet approved or cleared by the [place] FDA and  has been authorized for detection and/or diagnosis of SARS-CoV-2 by FDA under an Emergency Use Authorization (EUA). This EUA will remain  in effect (meaning this test can be used) for the duration of the COVID-19 declaration under Section  564(b)(1) of the Act, 21 U.S.C.section 360bbb-3(b)(1), unless the authorization is terminated  or revoked sooner.       Influenza A by PCR NEGATIVE NEGATIVE Final   Influenza B by PCR NEGATIVE NEGATIVE Final    Comment: (NOTE) The Xpert Xpress SARS-CoV-2/FLU/RSV plus assay is intended as an aid in the diagnosis of influenza from Nasopharyngeal swab specimens and should not be used as a sole basis for treatment. Nasal washings and aspirates are unacceptable for Xpert Xpress SARS-CoV-2/FLU/RSV testing.  Fact Sheet for Patients: EntrepreneurPulse.com.au  Fact Sheet for Healthcare Providers: IncredibleEmployment.be  This test is not yet approved or cleared by the [place] FDA and has been authorized for detection and/or diagnosis of SARS-CoV-2 by FDA under an Emergency Use Authorization (EUA). This EUA will remain in effect (meaning this test can be used) for the duration of the COVID-19 declaration under Section 564(b)(1) of the Act, 21 U.S.C. section 360bbb-3(b)(1), unless the authorization is terminated or revoked.  Performed at [facility], [address], [place], [place] [address]      Radiology Studies: No results found.  Scheduled Meds: . insulin aspart  0-15 Units Subcutaneous TID WC  . insulin aspart  0-5 Units Subcutaneous QHS  . losartan  25 mg Oral Daily  . Rivaroxaban  15 mg Oral BID WC   Followed by  . [START ON 09/10/2020] rivaroxaban  20 mg Oral Q supper  . rosuvastatin  10 mg Oral Daily   Continuous Infusions:    LOS: 4 days   Time spent: 25 minutes.  Lorella Nimrod, MD [facility]  If 7PM-7AM, please contact night-coverage [email or web address]  08/22/2020, 2:02 PM   This record has been created using Systems analyst. Errors have been sought and corrected,but may not always be located. Such creation errors do not reflect on the standard of care.

## 2020-08-22 NOTE — TOC Initial Note (Signed)
Transition of Care [facility]) - Initial/Assessment Note    Patient Details  Name: Courtney Mack MRN: [id number] Date of Birth: Oct 15, 1955  Transition of Care [facility]) CM/SW Contact:    Shelbie Hutching, RN Phone Number: 08/22/2020, 2:38 PM  Clinical Narrative:                 Patient admitted to the hospital with a PE.  RNCM met with patient at the bedside today to discuss PT recommendation for SNF.  Patient has no significant medical history and lives at home with her daughter Kyra Manges.  Patient is independent at home and has a walker, cane, and lift chair.  PCP is Dr. Tressia Miners and patient has appointment scheduled for 2/23. Patient agrees to SNF.  Bed search started.  RNCM will follow up with patient tomorrow and present bed offers.    Expected Discharge Plan: Skilled Nursing Facility Barriers to Discharge: SNF Pending bed offer   Patient Goals and CMS Choice Patient states their goals for this hospitalization and ongoing recovery are:: Patient wants to get back to being able to do the things she was doing before CMS Medicare.gov Compare Post Acute Care list provided to:: Patient Choice offered to / list presented to : Patient  Expected Discharge Plan and Services Expected Discharge Plan: [place]   Discharge Planning Services: CM Consult Post Acute Care Choice: [place] Living arrangements for the past 2 months: Single Family Home                 DME Arranged: N/A         HH Arranged: NA          Prior Living Arrangements/Services Living arrangements for the past 2 months: Single Family Home Lives with:: Adult Children Patient language and need for interpreter reviewed:: Yes Do you feel safe going back to the place where you live?: Yes      Need for Family Participation in Patient Care: Yes (Comment) (PE) Care giver support system in place?: Yes (comment) (daughters) Current home services: DME (walker, cane, and lift chair) Criminal  Activity/Legal Involvement Pertinent to Current Situation/Hospitalization: No - Comment as needed  Activities of Daily Living Home Assistive Devices/Equipment: None ADL Screening (condition at time of admission) Patient's cognitive ability adequate to safely complete daily activities?: Yes Is the patient deaf or have difficulty hearing?: No Does the patient have difficulty seeing, even when wearing glasses/contacts?: Yes Does the patient have difficulty concentrating, remembering, or making decisions?: No Patient able to express need for assistance with ADLs?: Yes Does the patient have difficulty dressing or bathing?: No Independently performs ADLs?: Yes (appropriate for developmental age) Does the patient have difficulty walking or climbing stairs?: No Weakness of Legs: None Weakness of Arms/Hands: None  Permission Sought/Granted Permission sought to share information with : Case Manager,Family Chief Financial Officer Permission granted to share information with : Yes, Verbal Permission Granted  Share Information with NAME: Kyra Manges  Permission granted to share info w AGENCY: SNF's  Permission granted to share info w Relationship: daughter     Emotional Assessment Appearance:: Appears stated age Attitude/Demeanor/Rapport: Engaged Affect (typically observed): Accepting Orientation: : Oriented to Self,Oriented to Place,Oriented to  Time,Oriented to Situation Alcohol / Substance Use: Not Applicable Psych Involvement: No (comment)  Admission diagnosis:  Shortness of breath [R06.02] Pulmonary embolism (HCC) [I26.99] Acute respiratory failure with hypoxia (HCC) [J96.01] Bilateral edema of lower extremity [R60.0] Saddle embolus of pulmonary artery with acute cor pulmonale, unspecified  chronicity ([place]) [I26.02] Patient Active Problem List   Diagnosis Date Noted  . Acute respiratory failure with hypoxia ([place])   . Pulmonary embolism ([place]) 08/18/2020  . Vaginal  bleeding, abnormal 08/18/2020  . Obesity, morbid, BMI 50 or higher ([place]) 08/18/2020   PCP:  Patient, No Pcp Per Pharmacy:  No Pharmacies Listed    Social Determinants of Health (SDOH) Interventions    Readmission Risk Interventions No flowsheet data found.

## 2020-08-22 NOTE — NC FL2 (Signed)
[place] LEVEL OF CARE SCREENING TOOL     IDENTIFICATION  Patient Name: Courtney Mack Birthdate: [birth year] Sex: female Admission Date (Current Location): 08/18/2020  [place] and [place] Number:  Engineering geologist and Address:  [facility], [address] [place],  [address]      Provider Number: [id number]  Attending Physician Name and Address:  Lorella Nimrod, MD  Relative Name and Phone Number:  Eula Flax (daughter) [phone]    Current Level of Care: SNF Recommended Level of Care: [place] Prior Approval Number:    Date Approved/Denied:   PASRR Number: [id number]  Discharge Plan: SNF    Current Diagnoses: Patient Active Problem List   Diagnosis Date Noted  . Acute respiratory failure with hypoxia ([place])   . Pulmonary embolism ([place]) 08/18/2020  . Vaginal bleeding, abnormal 08/18/2020  . Obesity, morbid, BMI 50 or higher ([place]) 08/18/2020    Orientation RESPIRATION BLADDER Height & Weight     Self,Situation,Place,Time  Normal Continent Weight: 110.2 kg Height:  4\' 10"  (147.3 cm)  BEHAVIORAL SYMPTOMS/MOOD NEUROLOGICAL BOWEL NUTRITION STATUS      Continent Diet (Carb modified 1600-2000 calories)  AMBULATORY STATUS COMMUNICATION OF NEEDS Skin   Limited Assist Verbally Normal                       Personal Care Assistance Level of Assistance  Bathing,Feeding,Dressing Bathing Assistance: Limited assistance Feeding assistance: Limited assistance Dressing Assistance: Limited assistance     Functional Limitations Info             SPECIAL CARE FACTORS FREQUENCY  PT (By licensed PT),OT (By licensed OT)     PT Frequency: 5 times per week OT Frequency: 5 times per week            Contractures Contractures Info: Not present    Additional Factors Info  Code Status,Allergies Code Status Info: Full Allergies Info: NKA           Current Medications (08/22/2020):  This  is the current hospital active medication list Current Facility-Administered Medications  Medication Dose Route Frequency Provider Last Rate Last Admin  . HYDROmorphone (DILAUDID) injection 1 mg  1 mg Intravenous Once PRN Cox, Amy N, DO      . insulin aspart (novoLOG) injection 0-15 Units  0-15 Units Subcutaneous TID WC Lorella Nimrod, MD   2 Units at 08/21/20 1852  . insulin aspart (novoLOG) injection 0-5 Units  0-5 Units Subcutaneous QHS Lorella Nimrod, MD   2 Units at 08/20/20 0022  . losartan (COZAAR) tablet 25 mg  25 mg Oral Daily Lorella Nimrod, MD   25 mg at 08/22/20 9833  . ondansetron (ZOFRAN) tablet 4 mg  4 mg Oral Q6H PRN Cox, Amy N, DO       Or  . ondansetron (ZOFRAN) injection 4 mg  4 mg Intravenous Q6H PRN Cox, Amy N, DO      . ondansetron (ZOFRAN) injection 4 mg  4 mg Intravenous Q6H PRN Cox, Amy N, DO      . Rivaroxaban (XARELTO) tablet 15 mg  15 mg Oral BID WC Lorella Nimrod, MD   15 mg at 08/22/20 0833   Followed by  . [START ON 09/10/2020] rivaroxaban (XARELTO) tablet 20 mg  20 mg Oral Q supper Lorella Nimrod, MD      . rosuvastatin (CRESTOR) tablet 10 mg  10 mg Oral Daily Lorella Nimrod, MD   10 mg at 08/22/20  0833     Discharge Medications: Please see discharge summary for a list of discharge medications.  Relevant Imaging Results:  Relevant Lab Results:   Additional Information SS# [id number]  Shelbie Hutching, RN

## 2020-08-23 DIAGNOSIS — I2602 Saddle embolus of pulmonary artery with acute cor pulmonale: Secondary | ICD-10-CM | POA: Diagnosis not present

## 2020-08-23 DIAGNOSIS — I10 Essential (primary) hypertension: Secondary | ICD-10-CM | POA: Diagnosis present

## 2020-08-23 DIAGNOSIS — E785 Hyperlipidemia, unspecified: Secondary | ICD-10-CM | POA: Diagnosis present

## 2020-08-23 DIAGNOSIS — R600 Localized edema: Secondary | ICD-10-CM | POA: Diagnosis not present

## 2020-08-23 DIAGNOSIS — J9601 Acute respiratory failure with hypoxia: Secondary | ICD-10-CM | POA: Diagnosis not present

## 2020-08-23 DIAGNOSIS — E119 Type 2 diabetes mellitus without complications: Secondary | ICD-10-CM

## 2020-08-23 DIAGNOSIS — N939 Abnormal uterine and vaginal bleeding, unspecified: Secondary | ICD-10-CM

## 2020-08-23 DIAGNOSIS — E66813 Obesity, class 3: Secondary | ICD-10-CM | POA: Diagnosis present

## 2020-08-23 LAB — GLUCOSE, CAPILLARY
Glucose-Capillary: 149 mg/dL — ABNORMAL HIGH (ref 70–99)
Glucose-Capillary: 137 mg/dL — ABNORMAL HIGH (ref 70–99)
Glucose-Capillary: 138 mg/dL — ABNORMAL HIGH (ref 70–99)
Glucose-Capillary: 160 mg/dL — ABNORMAL HIGH (ref 70–99)

## 2020-08-23 MED ORDER — COVID-19 MRNA VACC (MODERNA) 100 MCG/0.5ML IM SUSP
0.5000 mL | Freq: Once | INTRAMUSCULAR | Status: AC
  Administered 2020-08-24: 0.5 mL via INTRAMUSCULAR
  Filled 2020-08-23 (×2): qty 0.5

## 2020-08-23 MED ORDER — NYSTATIN 100000 UNIT/GM EX POWD
Freq: Two times a day (BID) | CUTANEOUS | Status: DC
  Filled 2020-08-23: qty 15

## 2020-08-23 NOTE — Progress Notes (Signed)
Physical Therapy Treatment Patient Details Name: Courtney Mack MRN: [id number] DOB: 1955-10-08 Today's Date: 08/23/2020    History of Present Illness presented to ER secondary to progressive SOB; admitted for management of acute hypoxic respiratory failure related to extensive bilat PE.  s/p thrombectomy/thromboylsis with IVC filter placement (08/18/20)    PT Comments    Pt was sitting in recliner upon arriving. She agrees to PT session and is cooperative throughout. " I'm feeling much better. Pt agrees to ambulation and activity however continues to be limited by endurance. Was able to ambulate to doorway of room and return with min assist. SOB noted but vitals stable. Pt will greatly benefit from SNF at DC to address deficits while improving independence with ADLs.   Follow Up Recommendations  SNF     Equipment Recommendations  Other (comment) (defer to next level of care)       Precautions / Restrictions Precautions Precautions: Fall Restrictions Weight Bearing Restrictions: No    Mobility  Bed Mobility  General bed mobility comments: in recliner pre/post session    Transfers Overall transfer level: Needs assistance Equipment used: Rolling walker (2 wheeled) Transfers: Sit to/from Stand Sit to Stand: Min assist;Mod assist         General transfer comment: pt performed STS 3 x fro recliner surface throughout session  Ambulation/Gait Ambulation/Gait assistance: Min assist Gait Distance (Feet): 40 Feet Assistive device: Rolling walker (2 wheeled) Gait Pattern/deviations: Step-through pattern;Trunk flexed Gait velocity: decreased   General Gait Details: pt was able to ambulate 80ft with RW without LOB however did require min assist due to fatigue/SOB and concerns with making it back to recliner. overall tolerated well but was limited by activity tolerance.      Balance Overall balance assessment: Needs assistance Sitting-balance support: No upper extremity  supported;Feet supported Sitting balance-Leahy Scale: Good     Standing balance support: Bilateral upper extremity supported Standing balance-Leahy Scale: Fair             Cognition Arousal/Alertness: Awake/alert Behavior During Therapy: WFL for tasks assessed/performed Overall Cognitive Status: Within Functional Limits for tasks assessed          General Comments: pt was A and O x 4             Pertinent Vitals/Pain Pain Assessment: No/denies pain           PT Goals (current goals can now be found in the care plan section) Acute Rehab PT Goals Patient Stated Goal: go to rehab get stronger Progress towards PT goals: Progressing toward goals    Frequency    Min 2X/week      PT Plan Current plan remains appropriate       AM-PAC PT "6 Clicks" Mobility   Outcome Measure  Help needed turning from your back to your side while in a flat bed without using bedrails?: None Help needed moving from lying on your back to sitting on the side of a flat bed without using bedrails?: A Little Help needed moving to and from a bed to a chair (including a wheelchair)?: A Little Help needed standing up from a chair using your arms (e.g., wheelchair or bedside chair)?: A Little Help needed to walk in hospital room?: A Little Help needed climbing 3-5 steps with a railing? : A Little 6 Click Score: 19    End of Session Equipment Utilized During Treatment: Gait belt Activity Tolerance: Patient tolerated treatment well Patient left: in chair;with call bell/phone within reach;with chair  alarm set Nurse Communication: Mobility status PT Visit Diagnosis: Muscle weakness (generalized) (M62.81);Difficulty in walking, not elsewhere classified (R26.2)     Time: 5885-0277 PT Time Calculation (min) (ACUTE ONLY): 25 min  Charges:  $Gait Training: 8-22 mins $Therapeutic Activity: 8-22 mins                     Julaine Fusi PTA 08/23/20, 1:10 PM

## 2020-08-23 NOTE — TOC Progression Note (Signed)
Transition of Care [facility]) - Progression Note    Patient Details  Name: Courtney Mack MRN: [id number] Date of Birth: [birth year]  [facility]) CM/SW Contact  Shelbie Hutching, RN Phone Number: 08/23/2020, 1:08 PM  Clinical Narrative:    [facility] has offered a bed and patient accepts bed offer.  Magda Paganini over at [facility] is starting insurance authorization as patient's [name] is not managed by [employer].     Expected Discharge Plan: Skilled Nursing Facility Barriers to Discharge: SNF Pending bed offer  Expected Discharge Plan and Services Expected Discharge Plan: [place]   Discharge Planning Services: CM Consult Post Acute Care Choice: [place] Living arrangements for the past 2 months: Single Family Home                 DME Arranged: N/A         HH Arranged: NA           Social Determinants of Health (SDOH) Interventions    Readmission Risk Interventions No flowsheet data found.

## 2020-08-23 NOTE — Plan of Care (Signed)

## 2020-08-23 NOTE — Progress Notes (Signed)
PROGRESS NOTE    Courtney Mack  ZTI:[id number] DOB: [birth year] DOA: 08/18/2020 PCP: Gladstone Lighter, MD     Brief Narrative:  65 y.o.femalePMHx MORBID OBESITY, has not been to a doctor in greater than 10 years and currently not on any prescribed medications   Presents to the emergency department for chief concerns of worsening shortness of breath for the past 2 days.  No chest pain. No obvious VTE risk factor, no traveling or prolonged immobilization.  No history of active cancer. CTA with bilateral massive PE with right heart strain. Venous Doppler was positive for bilateral DVTs. She was taken to the OR by vascular surgeon for thrombectomy and thrombolysis, IVC filter was also placed. Patient is unvaccinated and Covid test came back negative. She will need outpatient oncologic work-up, as having some postmenopausal bleeding. She does endorse that she will have a PCP establish care appointment on 08/30/2020. PT recommending SNF-waiting for placement   Subjective: Afebrile overnight, A/O x4.  Negative previous history of PE or DVT.  States has not been on any medication until this hospitalization.   Assessment & Plan: Covid vaccination; unvaccinated, requests vaccination; 2/16 ordered   Principal Problem:   Pulmonary embolism ([place]) Active Problems:   Vaginal bleeding, abnormal   Obesity, morbid, BMI 50 or higher ([place])   Acute respiratory failure with hypoxia (H[facility])   Morbidly obese ([facility])   Essential hypertension   New onset type 2 diabetes mellitus ([place])   Dyslipidemia   Acute hypoxic respiratory failure secondary to massive bilateral PE with right heart strain.   -S/p suction embolectomy and thrombolysis by vascular surgery.   -IVC filter was placed.  Venous -Doppler positive for bilateral LE DVTs -Continue Xarelto -Currently on room air when sedentary -Echocardiogram-normal EF with grade 1 diastolic dysfunction. -Patient needs oncology work-up as an  outpatient.  Secondary to postmenopausal bleed.  States already has a appointment with her OB/GYN -Awaiting SNF placement -2/16 ambulatory SPO2 pending  Acute diastolic CHF -Strict in and out -Daily weight   DM type II new onset -Patient states had gestational diabetes -2/13 hemoglobin A1c= 10.1 -2/16 diabetic coordinator consult pending -2/16 diabetic nutrition consult pending -Dependent on renal findings in a.m. we will start patient on Metformin to ensure she tolerates   Dyslipidemia.  -Lipid profile with elevated total cholesterol, triglyceride and LDL and low HDL. -Started her on Crestor 10 mg daily.  Essential HTN - Losartan at 25 mg daily  Morbidly obese/Body mass index is 50.79 kg/m.  -That makes her high risk for diabetes, hypertension along with other comorbidities. -She was counseled for weight loss and will need continuous counseling by PCP.   DVT prophylaxis: Xarelto Code Status: Full Family Communication:  Status is: Inpatient    Dispo: The patient is from: Home              Anticipated d/c is to: SNF              Anticipated d/c date is: 2/18              Patient currently unstable      Consultants:    Procedures/Significant Events:    I have personally reviewed and interpreted all radiology studies and my findings are as above.  VENTILATOR SETTINGS: Room air 2/16 SPO2 94%   Cultures   Antimicrobials:    Devices    LINES / TUBES:      Continuous Infusions:   Objective: Vitals:   08/23/20 0403 08/23/20 0743 08/23/20 1149  08/23/20 1541  BP: (!) 114/59 (!) 112/59 110/69 (!) 106/58  Pulse: 88 80 85 86  Resp: 20 15 17 15   Temp: 97.9 F (36.6 C) 98.6 F (37 C) 97.8 F (36.6 C) 98.8 F (37.1 C)  TempSrc: Oral Oral Oral Oral  SpO2: 90% 94% 93% 93%  Weight:      Height:        Intake/Output Summary (Last 24 hours) at 08/23/2020 2102 Last data filed at 08/23/2020 1835 Gross per 24 hour  Intake 360 ml  Output 1100  ml  Net -740 ml   Filed Weights   08/19/20 0421 08/20/20 0340 08/21/20 0432  Weight: 112.4 kg 110 kg 110.2 kg    Examination:  General: A/O x4 No acute respiratory distress Eyes: negative scleral hemorrhage, negative anisocoria, negative icterus ENT: Negative Runny nose, negative gingival bleeding, Neck:  Negative scars, masses, torticollis, lymphadenopathy, JVD Lungs: Clear to auscultation bilaterally without wheezes or crackles Cardiovascular: Regular rate and rhythm without murmur gallop or rub normal S1 and S2 Abdomen: MORBIDLY OBESE, negative abdominal pain, nondistended, positive soft, bowel sounds, no rebound, no ascites, no appreciable mass Extremities: No significant cyanosis, clubbing, or edema bilateral lower extremities Skin: Negative rashes, lesions, ulcers Psychiatric:  Negative depression, negative anxiety, negative fatigue, negative mania  Central nervous system:  Cranial nerves II through XII intact, tongue/uvula midline, all extremities muscle strength 5/5, sensation intact throughout, negative dysarthria, negative expressive aphasia, negative receptive aphasia.  .     Data Reviewed: Care during the described time interval was provided by me .  I have reviewed this patient's available data, including medical history, events of note, physical examination, and all test results as part of my evaluation.  CBC: Recent Labs  Lab 08/18/20 1159 08/19/20 0303 08/20/20 0045  WBC 8.8 9.1 14.0*  NEUTROABS 6.5  --   --   HGB 12.3 10.9* 10.1*  HCT 39.7 34.9* 32.7*  MCV 83.2 83.1 83.6  PLT 269 232 751   Basic Metabolic Panel: Recent Labs  Lab 08/18/20 1159 08/19/20 0303 08/20/20 0045  NA 138 138  --   K 4.0 4.4  --   CL 102 106  --   CO2 24 22  --   GLUCOSE 225* 320*  --   BUN 16 18  --   CREATININE 0.92 0.83 0.92  CALCIUM 9.4 8.9  --    GFR: Estimated Creatinine Clearance: 66 mL/min (by C-G formula based on SCr of 0.92 mg/dL). Liver Function  Tests: Recent Labs  Lab 08/18/20 1159  AST 23  ALT 11  ALKPHOS 70  BILITOT 0.9  PROT 7.8  ALBUMIN 3.5   No results for input(s): LIPASE, AMYLASE in the last 168 hours. No results for input(s): AMMONIA in the last 168 hours. Coagulation Profile: Recent Labs  Lab 08/18/20 2229  INR 1.3*   Cardiac Enzymes: No results for input(s): CKTOTAL, CKMB, CKMBINDEX, TROPONINI in the last 168 hours. BNP (last 3 results) No results for input(s): PROBNP in the last 8760 hours. HbA1C: No results for input(s): HGBA1C in the last 72 hours. CBG: Recent Labs  Lab 08/22/20 1725 08/22/20 2119 08/23/20 0742 08/23/20 1155 08/23/20 1544  GLUCAP 155* 172* 149* 137* 138*   Lipid Profile: No results for input(s): CHOL, HDL, LDLCALC, TRIG, CHOLHDL, LDLDIRECT in the last 72 hours. Thyroid Function Tests: No results for input(s): TSH, T4TOTAL, FREET4, T3FREE, THYROIDAB in the last 72 hours. Anemia Panel: No results for input(s): VITAMINB12, FOLATE, FERRITIN, TIBC, IRON, RETICCTPCT in  the last 72 hours. Sepsis Labs: Recent Labs  Lab 08/18/20 1159  PROCALCITON <0.10    Recent Results (from the past 240 hour(s))  Resp Panel by RT-PCR (Flu A&B, Covid) Nasopharyngeal Swab     Status: None   Collection Time: 08/18/20  1:24 PM   Specimen: Nasopharyngeal Swab; Nasopharyngeal(NP) swabs in vial transport medium  Result Value Ref Range Status   SARS Coronavirus 2 by RT PCR NEGATIVE NEGATIVE Final    Comment: (NOTE) SARS-CoV-2 target nucleic acids are NOT DETECTED.  The SARS-CoV-2 RNA is generally detectable in upper respiratory specimens during the acute phase of infection. The lowest concentration of SARS-CoV-2 viral copies this assay can detect is 138 copies/mL. A negative result does not preclude SARS-Cov-2 infection and should not be used as the sole basis for treatment or other patient management decisions. A negative result may occur with  improper specimen collection/handling, submission  of specimen other than nasopharyngeal swab, presence of viral mutation(s) within the areas targeted by this assay, and inadequate number of viral copies(<138 copies/mL). A negative result must be combined with clinical observations, patient history, and epidemiological information. The expected result is Negative.  Fact Sheet for Patients:  EntrepreneurPulse.com.au  Fact Sheet for Healthcare Providers:  IncredibleEmployment.be  This test is no t yet approved or cleared by the [place] FDA and  has been authorized for detection and/or diagnosis of SARS-CoV-2 by FDA under an Emergency Use Authorization (EUA). This EUA will remain  in effect (meaning this test can be used) for the duration of the COVID-19 declaration under Section 564(b)(1) of the Act, 21 U.S.C.section 360bbb-3(b)(1), unless the authorization is terminated  or revoked sooner.       Influenza A by PCR NEGATIVE NEGATIVE Final   Influenza B by PCR NEGATIVE NEGATIVE Final    Comment: (NOTE) The Xpert Xpress SARS-CoV-2/FLU/RSV plus assay is intended as an aid in the diagnosis of influenza from Nasopharyngeal swab specimens and should not be used as a sole basis for treatment. Nasal washings and aspirates are unacceptable for Xpert Xpress SARS-CoV-2/FLU/RSV testing.  Fact Sheet for Patients: EntrepreneurPulse.com.au  Fact Sheet for Healthcare Providers: IncredibleEmployment.be  This test is not yet approved or cleared by the [place] FDA and has been authorized for detection and/or diagnosis of SARS-CoV-2 by FDA under an Emergency Use Authorization (EUA). This EUA will remain in effect (meaning this test can be used) for the duration of the COVID-19 declaration under Section 564(b)(1) of the Act, 21 U.S.C. section 360bbb-3(b)(1), unless the authorization is terminated or revoked.  Performed at [facility], [address], [place],  [address]          Radiology Studies: No results found.      Scheduled Meds: . COVID-19 mRNA vaccine (Moderna)  0.5 mL Intramuscular Once  . insulin aspart  0-15 Units Subcutaneous TID WC  . insulin aspart  0-5 Units Subcutaneous QHS  . losartan  25 mg Oral Daily  . nystatin   Topical BID  . Rivaroxaban  15 mg Oral BID WC   Followed by  . [START ON 09/10/2020] rivaroxaban  20 mg Oral Q supper  . rosuvastatin  10 mg Oral Daily   Continuous Infusions:   LOS: 5 days    Time spent:40 min    WOODS, Geraldo Docker, MD [facility]   If 7PM-7AM, please contact night-coverage 08/23/2020, 9:02 PM

## 2020-08-24 DIAGNOSIS — J9601 Acute respiratory failure with hypoxia: Secondary | ICD-10-CM | POA: Diagnosis not present

## 2020-08-24 DIAGNOSIS — Z7901 Long term (current) use of anticoagulants: Secondary | ICD-10-CM | POA: Diagnosis not present

## 2020-08-24 DIAGNOSIS — D649 Anemia, unspecified: Secondary | ICD-10-CM | POA: Diagnosis present

## 2020-08-24 DIAGNOSIS — I825Y3 Chronic embolism and thrombosis of unspecified deep veins of proximal lower extremity, bilateral: Secondary | ICD-10-CM | POA: Diagnosis not present

## 2020-08-24 DIAGNOSIS — E119 Type 2 diabetes mellitus without complications: Secondary | ICD-10-CM | POA: Diagnosis not present

## 2020-08-24 DIAGNOSIS — Z79899 Other long term (current) drug therapy: Secondary | ICD-10-CM | POA: Diagnosis not present

## 2020-08-24 DIAGNOSIS — I2699 Other pulmonary embolism without acute cor pulmonale: Secondary | ICD-10-CM | POA: Diagnosis not present

## 2020-08-24 DIAGNOSIS — E118 Type 2 diabetes mellitus with unspecified complications: Secondary | ICD-10-CM | POA: Diagnosis not present

## 2020-08-24 DIAGNOSIS — Z7951 Long term (current) use of inhaled steroids: Secondary | ICD-10-CM | POA: Diagnosis not present

## 2020-08-24 DIAGNOSIS — I5030 Unspecified diastolic (congestive) heart failure: Secondary | ICD-10-CM | POA: Diagnosis not present

## 2020-08-24 DIAGNOSIS — R279 Unspecified lack of coordination: Secondary | ICD-10-CM | POA: Diagnosis not present

## 2020-08-24 DIAGNOSIS — N8500 Endometrial hyperplasia, unspecified: Secondary | ICD-10-CM | POA: Diagnosis not present

## 2020-08-24 DIAGNOSIS — R791 Abnormal coagulation profile: Secondary | ICD-10-CM | POA: Diagnosis not present

## 2020-08-24 DIAGNOSIS — Z7984 Long term (current) use of oral hypoglycemic drugs: Secondary | ICD-10-CM | POA: Diagnosis not present

## 2020-08-24 DIAGNOSIS — R031 Nonspecific low blood-pressure reading: Secondary | ICD-10-CM | POA: Diagnosis not present

## 2020-08-24 DIAGNOSIS — N950 Postmenopausal bleeding: Secondary | ICD-10-CM | POA: Diagnosis not present

## 2020-08-24 DIAGNOSIS — E785 Hyperlipidemia, unspecified: Secondary | ICD-10-CM | POA: Diagnosis not present

## 2020-08-24 DIAGNOSIS — C55 Malignant neoplasm of uterus, part unspecified: Secondary | ICD-10-CM | POA: Diagnosis not present

## 2020-08-24 DIAGNOSIS — I110 Hypertensive heart disease with heart failure: Secondary | ICD-10-CM | POA: Diagnosis not present

## 2020-08-24 DIAGNOSIS — Z95828 Presence of other vascular implants and grafts: Secondary | ICD-10-CM | POA: Diagnosis not present

## 2020-08-24 DIAGNOSIS — R0602 Shortness of breath: Secondary | ICD-10-CM | POA: Diagnosis not present

## 2020-08-24 DIAGNOSIS — R600 Localized edema: Secondary | ICD-10-CM | POA: Diagnosis not present

## 2020-08-24 DIAGNOSIS — Z20822 Contact with and (suspected) exposure to covid-19: Secondary | ICD-10-CM | POA: Diagnosis not present

## 2020-08-24 DIAGNOSIS — Z87891 Personal history of nicotine dependence: Secondary | ICD-10-CM | POA: Diagnosis not present

## 2020-08-24 DIAGNOSIS — I2602 Saddle embolus of pulmonary artery with acute cor pulmonale: Secondary | ICD-10-CM | POA: Diagnosis not present

## 2020-08-24 DIAGNOSIS — Z833 Family history of diabetes mellitus: Secondary | ICD-10-CM | POA: Diagnosis not present

## 2020-08-24 DIAGNOSIS — N939 Abnormal uterine and vaginal bleeding, unspecified: Secondary | ICD-10-CM | POA: Diagnosis not present

## 2020-08-24 DIAGNOSIS — Z86718 Personal history of other venous thrombosis and embolism: Secondary | ICD-10-CM | POA: Diagnosis not present

## 2020-08-24 DIAGNOSIS — R58 Hemorrhage, not elsewhere classified: Secondary | ICD-10-CM | POA: Diagnosis not present

## 2020-08-24 DIAGNOSIS — I10 Essential (primary) hypertension: Secondary | ICD-10-CM | POA: Diagnosis not present

## 2020-08-24 DIAGNOSIS — I2782 Chronic pulmonary embolism: Secondary | ICD-10-CM | POA: Diagnosis not present

## 2020-08-24 DIAGNOSIS — Z86711 Personal history of pulmonary embolism: Secondary | ICD-10-CM | POA: Diagnosis not present

## 2020-08-24 DIAGNOSIS — Z48812 Encounter for surgical aftercare following surgery on the circulatory system: Secondary | ICD-10-CM | POA: Diagnosis not present

## 2020-08-24 DIAGNOSIS — R5381 Other malaise: Secondary | ICD-10-CM | POA: Diagnosis not present

## 2020-08-24 DIAGNOSIS — I959 Hypotension, unspecified: Secondary | ICD-10-CM | POA: Diagnosis not present

## 2020-08-24 DIAGNOSIS — Z743 Need for continuous supervision: Secondary | ICD-10-CM | POA: Diagnosis not present

## 2020-08-24 DIAGNOSIS — Z23 Encounter for immunization: Secondary | ICD-10-CM | POA: Diagnosis not present

## 2020-08-24 DIAGNOSIS — C541 Malignant neoplasm of endometrium: Secondary | ICD-10-CM | POA: Diagnosis not present

## 2020-08-24 DIAGNOSIS — R9389 Abnormal findings on diagnostic imaging of other specified body structures: Secondary | ICD-10-CM | POA: Diagnosis not present

## 2020-08-24 DIAGNOSIS — D62 Acute posthemorrhagic anemia: Secondary | ICD-10-CM | POA: Diagnosis not present

## 2020-08-24 DIAGNOSIS — Z6841 Body Mass Index (BMI) 40.0 and over, adult: Secondary | ICD-10-CM | POA: Diagnosis not present

## 2020-08-24 LAB — CBC
HCT: 31.4 % — ABNORMAL LOW (ref 36.0–46.0)
Hemoglobin: 9.7 g/dL — ABNORMAL LOW (ref 12.0–15.0)
MCH: 25.5 pg — ABNORMAL LOW (ref 26.0–34.0)
MCHC: 30.9 g/dL (ref 30.0–36.0)
MCV: 82.6 fL (ref 80.0–100.0)
Platelets: 236 10*3/uL (ref 150–400)
RBC: 3.8 MIL/uL — ABNORMAL LOW (ref 3.87–5.11)
RDW: 17 % — ABNORMAL HIGH (ref 11.5–15.5)
WBC: 8.7 10*3/uL (ref 4.0–10.5)
nRBC: 0.2 % (ref 0.0–0.2)

## 2020-08-24 LAB — COMPREHENSIVE METABOLIC PANEL
ALT: 12 U/L (ref 0–44)
AST: 19 U/L (ref 15–41)
Albumin: 3 g/dL — ABNORMAL LOW (ref 3.5–5.0)
Alkaline Phosphatase: 61 U/L (ref 38–126)
Anion gap: 7 (ref 5–15)
BUN: 16 mg/dL (ref 8–23)
CO2: 25 mmol/L (ref 22–32)
Calcium: 8.6 mg/dL — ABNORMAL LOW (ref 8.9–10.3)
Chloride: 104 mmol/L (ref 98–111)
Creatinine, Ser: 0.86 mg/dL (ref 0.44–1.00)
GFR, Estimated: >60 mL/min (ref >60)
Glucose, Bld: 148 mg/dL — ABNORMAL HIGH (ref 70–99)
Potassium: 4 mmol/L (ref 3.5–5.1)
Sodium: 136 mmol/L (ref 135–145)
Total Bilirubin: 1.3 mg/dL — ABNORMAL HIGH (ref 0.3–1.2)
Total Protein: 6.3 g/dL — ABNORMAL LOW (ref 6.5–8.1)

## 2020-08-24 LAB — RETICULOCYTES
Immature Retic Fract: 24.9 % — ABNORMAL HIGH (ref 2.3–15.9)
RBC.: 4.09 MIL/uL (ref 3.87–5.11)
Retic Count, Absolute: 102.7 10*3/uL (ref 19.0–186.0)
Retic Ct Pct: 2.5 % (ref 0.4–3.1)

## 2020-08-24 LAB — MAGNESIUM: Magnesium: 2 mg/dL (ref 1.7–2.4)

## 2020-08-24 LAB — FOLATE: Folate: 20.6 ng/mL (ref >5.9)

## 2020-08-24 LAB — VITAMIN B12: Vitamin B-12: 247 pg/mL (ref 180–914)

## 2020-08-24 LAB — RESP PANEL BY RT-PCR (FLU A&B, COVID) ARPGX2
Influenza A by PCR: NEGATIVE
Influenza B by PCR: NEGATIVE
SARS Coronavirus 2 by RT PCR: NEGATIVE

## 2020-08-24 LAB — PHOSPHORUS: Phosphorus: 3.4 mg/dL (ref 2.5–4.6)

## 2020-08-24 LAB — FERRITIN: Ferritin: 12 ng/mL (ref 11–307)

## 2020-08-24 LAB — IRON AND TIBC
Iron: 17 ug/dL — ABNORMAL LOW (ref 28–170)
Saturation Ratios: 5 % — ABNORMAL LOW (ref 10.4–31.8)
TIBC: 370 ug/dL (ref 250–450)
UIBC: 353 ug/dL

## 2020-08-24 LAB — GLUCOSE, CAPILLARY
Glucose-Capillary: 137 mg/dL — ABNORMAL HIGH (ref 70–99)
Glucose-Capillary: 155 mg/dL — ABNORMAL HIGH (ref 70–99)
Glucose-Capillary: 136 mg/dL — ABNORMAL HIGH (ref 70–99)

## 2020-08-24 LAB — SARS CORONAVIRUS 2 (TAT 6-24 HRS): SARS Coronavirus 2: NEGATIVE

## 2020-08-24 MED ORDER — METFORMIN HCL 500 MG PO TABS: 500.0000 mg | ORAL_TABLET | Freq: Two times a day (BID) | ORAL | 0 refills | Status: DC

## 2020-08-24 MED ORDER — METFORMIN HCL 500 MG PO TABS
500.0000 mg | ORAL_TABLET | Freq: Two times a day (BID) | ORAL | Status: DC
  Filled 2020-08-24 (×2): qty 1

## 2020-08-24 MED ORDER — NYSTATIN 100000 UNIT/GM EX POWD: Freq: Two times a day (BID) | CUTANEOUS | 0 refills | Status: DC

## 2020-08-24 MED ORDER — LOSARTAN POTASSIUM 25 MG PO TABS: 25.0000 mg | ORAL_TABLET | Freq: Every day | ORAL | 0 refills | Status: DC

## 2020-08-24 MED ORDER — ONDANSETRON HCL 4 MG PO TABS: 4.0000 mg | ORAL_TABLET | Freq: Four times a day (QID) | ORAL | 0 refills | Status: DC | PRN

## 2020-08-24 MED ORDER — ROSUVASTATIN CALCIUM 10 MG PO TABS: 10.0000 mg | ORAL_TABLET | Freq: Every day | ORAL | 0 refills | Status: DC

## 2020-08-24 MED ORDER — LIVING WELL WITH DIABETES BOOK
Freq: Once | Status: AC
  Filled 2020-08-24: qty 1

## 2020-08-24 MED ORDER — RIVAROXABAN (XARELTO) VTE STARTER PACK (15 & 20 MG): ORAL_TABLET | ORAL | 0 refills | Status: DC

## 2020-08-24 NOTE — TOC Progression Note (Signed)
Transition of Care [facility]) - Progression Note    Patient Details  Name: RIANN OMAN MRN: [id number] Date of Birth: [birth year]  Transition of Care [facility]) CM/SW Contact  Shelbie Hutching, RN Phone Number: 08/24/2020, 3:17 PM  Clinical Narrative:    COVID test back and negative.  Patient going to room 410.  RNCM has scheduled transport with [facility] and she is 2nd on the list for pick up.    Expected Discharge Plan: [place] Barriers to Discharge: No Barriers Identified  Expected Discharge Plan and Services Expected Discharge Plan: [place]   Discharge Planning Services: CM Consult Post Acute Care Choice: [place] Living arrangements for the past 2 months: Single Family Home Expected Discharge Date: 08/24/20               DME Arranged: N/A         HH Arranged: NA           Social Determinants of Health (SDOH) Interventions    Readmission Risk Interventions No flowsheet data found.

## 2020-08-24 NOTE — Progress Notes (Addendum)
Inpatient Diabetes Program Recommendations  AACE/ADA: New Consensus Statement on Inpatient Glycemic Control (2015)  Target Ranges:  Prepandial:   less than 140 mg/dL      Peak postprandial:   less than 180 mg/dL (1-2 hours)      Critically ill patients:  140 - 180 mg/dL   Results for Courtney Mack, Courtney Mack (MRN [id number]) as of 08/24/2020 09:26  Ref. Range 08/23/2020 07:42 08/23/2020 11:55 08/23/2020 15:44 08/23/2020 21:07  Glucose-Capillary Latest Ref Range: 70 - 99 mg/dL 149 (H)  2 units NOVOLOG  137 (H)  2 units NOVOLOG  138 (H)  2 units NOVOLOG  160 (H)   Results for Courtney Mack, Courtney Mack (MRN [id number]) as of 08/24/2020 09:26  Ref. Range 08/19/2020 10:17  Hemoglobin A1C Latest Ref Range: 4.8 - 5.6 % 6.6 (H)  (142 mg/dl)    Admit with: Acute hypoxic respiratory failure secondary to massive bilateral PE with right heart strain  New Diagnosis of Diabetes this admit as well--History of GDM  Current Orders: Novolog Moderate Correction Scale/ SSI (0-15 units) TID AC + HS    SNF for Rehab at time of d/c home  New Diagnosis of Diabetes per MD notes   Could try Metformin 500 mg BID at time of discharge as A1c only 6.6%    Addendum 11:30am--Met w/ pt at bedside to discuss new diagnosis of diabetes.  Spoke with pt about new diagnosis.  Discussed A1C results with her and explained what an A1C is, basic pathophysiology of DM Type 2, basic home care, basic diabetes diet nutrition principles, importance of checking CBGs and maintaining good CBG control to prevent long-term and short-term complications.  Reviewed signs and symptoms of hyperglycemia and hypoglycemia and how to treat hypoglycemia at home.  Also reviewed blood sugar goals and A1c goals for home.    Also discussed DM diet information with patient.  Encouraged patient to avoid beverages with sugar (regular soda, sweet tea, lemonade, fruit juice) and to consume mostly water.  Discussed what foods contain carbohydrates and how  carbohydrates affect the body's blood sugar levels.  Encouraged patient to be careful with her portion sizes (especially grains, starchy vegetables, and fruits).   RNs to provide ongoing basic DM education at bedside with this patient.  Have ordered educational booklet.  RD has seen pt today to provide DM diet education as well.     --Will follow patient during hospitalization--  Wyn Quaker RN, MSN, CDE Diabetes Coordinator Inpatient Glycemic Control Team Team Pager: [phone] (8a-5p)

## 2020-08-24 NOTE — Plan of Care (Signed)
Diabetes Diet Education  RD consulted for nutrition education regarding diabetes.  Lab Results  Component Value Date   HGBA1C 6.6 (H) 08/19/2020   RD provided "Carbohydrate Counting for People with Diabetes" handout from the Academy of Nutrition and Dietetics. Discussed different food groups and their effects on blood sugar, emphasizing carbohydrate-containing foods. Provided list of carbohydrates and recommended serving sizes of foods.   Discussed importance of controlled and consistent carbohydrate intake throughout the day. Provided examples of ways to balance meals/snacks and encouraged intake of high-fiber, whole grain complex carbohydrates. Teach back method used.   Expect good compliance. Pt attentive during education, was interested in information.   Body mass index is 52.76 kg/m. Pt meets criteria for morbid obesity based on current BMI.   Current diet order is Carb Modified, patient is consuming approximately 75-100% of meals at this time. Labs and medications reviewed. No further nutrition interventions warranted at this time. RD contact information provided. If additional nutrition issues arise, please re-consult RD.    Salvadore Oxford, Dietetic Intern 08/24/2020 11:20 AM

## 2020-08-24 NOTE — Progress Notes (Signed)
Physical Therapy Treatment Patient Details Name: Courtney Mack MRN: [id number] DOB: 09-Apr-1956 Today's Date: 08/24/2020    History of Present Illness presented to ER secondary to progressive SOB; admitted for management of acute hypoxic respiratory failure related to extensive bilat PE.  s/p thrombectomy/thromboylsis with IVC filter placement (08/18/20)    PT Comments    Pt was long sitting in bed watching TV upon arriving. She agrees to PT session and is extremely pleasant throughout. On rm air throughout session with sao2 >88% even during ambulation. She was able to exit L side of bed, stand to RW and ambulate ~ 60 ft however does fatigue quickly with increased assistance towards end of gait trails. She was repositioned in recliner post session with RN aware of her abilities and pt resting comfortably. Pt is very deconditioned and will greatly benefit from SNF to address deficits while assisting pt to PLOF.    Follow Up Recommendations  SNF     Equipment Recommendations  Other (comment) (defer to next level of care)    Recommendations for Other Services       Precautions / Restrictions Precautions Precautions: Fall Restrictions Weight Bearing Restrictions: No    Mobility  Bed Mobility Overal bed mobility: Needs Assistance Bed Mobility: Supine to Sit     Supine to sit: Mod assist     General bed mobility comments: INcreased time + mod assist to exit L side of bed    Transfers Overall transfer level: Needs assistance Equipment used: Rolling walker (2 wheeled) Transfers: Sit to/from Stand Sit to Stand: Min assist;Mod assist;From elevated surface         General transfer comment: Pt requires assistance with fwd wt shift + vcs for handplacment.  Ambulation/Gait Ambulation/Gait assistance: Min guard;Min assist Gait Distance (Feet): 60 Feet Assistive device: Rolling walker (2 wheeled) Gait Pattern/deviations: Step-through pattern;Trunk flexed Gait velocity:  decreased   General Gait Details: Pt fatigued and required min assist for safety to make it back to room and in recliner       Balance Overall balance assessment: Needs assistance Sitting-balance support: No upper extremity supported;Feet supported Sitting balance-Leahy Scale: Good     Standing balance support: Bilateral upper extremity supported Standing balance-Leahy Scale: Fair           Cognition Arousal/Alertness: Awake/alert Behavior During Therapy: WFL for tasks assessed/performed Overall Cognitive Status: Within Functional Limits for tasks assessed    General Comments: pt was A and O x 4             Pertinent Vitals/Pain Pain Assessment: No/denies pain           PT Goals (current goals can now be found in the care plan section) Acute Rehab PT Goals Patient Stated Goal: go to rehab get stronger Progress towards PT goals: Progressing toward goals    Frequency    Min 2X/week      PT Plan Current plan remains appropriate       AM-PAC PT "6 Clicks" Mobility   Outcome Measure  Help needed turning from your back to your side while in a flat bed without using bedrails?: None Help needed moving from lying on your back to sitting on the side of a flat bed without using bedrails?: A Little Help needed moving to and from a bed to a chair (including a wheelchair)?: A Lot Help needed standing up from a chair using your arms (e.g., wheelchair or bedside chair)?: A Lot Help needed to walk in hospital room?: A  Little Help needed climbing 3-5 steps with a railing? : A Little 6 Click Score: 17    End of Session Equipment Utilized During Treatment: Gait belt Activity Tolerance: Patient tolerated treatment well Patient left: in chair;with call bell/phone within reach;with chair alarm set Nurse Communication: Mobility status PT Visit Diagnosis: Muscle weakness (generalized) (M62.81);Difficulty in walking, not elsewhere classified (R26.2)     Time:  0990-6893 PT Time Calculation (min) (ACUTE ONLY): 14 min  Charges:  $Gait Training: 8-22 mins                     Julaine Fusi PTA 08/24/20, 12:23 PM

## 2020-08-24 NOTE — TOC Transition Note (Signed)
Transition of Care [facility]) - CM/SW Discharge Note   Patient Details  Name: ANJULIE DIPIERRO MRN: [id number] Date of Birth: Jul 15, 1955  [facility] CM/SW Contact:  Shelbie Hutching, RN Phone Number: 08/24/2020, 1:11 PM   Clinical Narrative:    Patient medically cleared for discharge to [place] today.  Janeece Riggers has received insurance authorization from [place].  Patient's repeat COVID test is negative.  Patient will receive a COVID vaccine before she leaves today.  Once vaccine has been sent up from the pharmacy and given RNCM will arrange transport to [facility].  Patient updated on discharge for today.  RNCM will arrange [facility] transport.     Final next level of care: [place] Barriers to Discharge: No Barriers Identified   Patient Goals and CMS Choice Patient states their goals for this hospitalization and ongoing recovery are:: Patient wants to get back to being able to do the things she was doing before CMS Medicare.gov Compare Post Acute Care list provided to:: Patient Choice offered to / list presented to : Patient  Discharge Placement PASRR number recieved: 08/22/20            Patient chooses bed at: [facility] Patient to be transferred to facility by: [facility] Name of family member notified: Patient will notify family Patient and family notified of of transfer: 08/24/20  Discharge Plan and Services   Discharge Planning Services: CM Consult Post Acute Care Choice: [facility]          DME Arranged: N/A         HH Arranged: NA          Social Determinants of Health (SDOH) Interventions     Readmission Risk Interventions No flowsheet data found.

## 2020-08-24 NOTE — Progress Notes (Signed)
Patient able to maintain O2 saturations at or above 90% on RA while ambulating.

## 2020-08-24 NOTE — Discharge Instructions (Signed)
Fingerstick glucose (sugar) goals for home: Before meals: 80-130 mg/dl 2-Hours after meals: less than 180 mg/dl Hemoglobin A1c goal: 7% or less

## 2020-08-24 NOTE — Discharge Summary (Signed)
Physician Discharge Summary  Courtney Mack FBP:[id number] DOB: [birth year] DOA: 08/18/2020  PCP: Gladstone Lighter, MD  Admit date: 08/18/2020 Discharge date: 08/24/2020  Time spent: 30 minutes  Recommendations for Outpatient Follow-up:   Covid vaccination; unvaccinated, requests vaccination; 2/16 ordered   Acute hypoxic respiratory failure secondary to massive bilateral PE with right heart strain. -S/p suction embolectomy and thrombolysis by vascular surgery.  -IVC filter was placed. Venous -Doppler positive for bilateral LE DVTs -Continue Xarelto minimum of 3 months -Currently on room air when sedentary -Echocardiogram-normal EF with grade 1 diastolic dysfunction. -Patient needs oncology work-up as an outpatient.  Secondary to postmenopausal bleed.  States already has a appointment with her OB/GYN -Patient able to maintain O2 saturations at or above 90% on RA while ambulating. -Does not meet criteria for home O2  Acute diastolic CHF -Strict in and out -135.27ml -Daily weight Filed Weights   08/20/20 0340 08/21/20 0432 08/24/20 0500  Weight: 110 kg 110.2 kg 114.5 kg    DM type II new onset -Patient states had gestational diabetes -2/13 hemoglobin A1c= 10.1 -2/16 diabetic coordinator; educated by coordinator.  -2/16 diabetic nutrition; educated by nutritionist.  -Metformin 500 mg BID.  PCP to titrate to effect.   Dyslipidemia. -Lipid profile with elevated total cholesterol, triglyceride and LDL and low HDL. -Crestor 10 mg daily.  Essential HTN -Losartan at 25 mg daily  Morbidly obese/Body mass index is 50.79 kg/m. -That makes her high risk for diabetes, hypertension along with other comorbidities. -She was counseled for weight loss and will need continuous counseling by PCP.  Anemia unspecified -Anemia panel pending.  Patient's PCP/OB/GYN will be able to see results and determine appropriate treatment as outpatient.   Discharge Diagnoses:   Principal Problem:   Pulmonary embolism (HCC) Active Problems:   Vaginal bleeding, abnormal   Obesity, morbid, BMI 50 or higher ([place])   Acute respiratory failure with hypoxia (HCC)   Morbidly obese ([place])   Essential hypertension   New onset type 2 diabetes mellitus ([place])   Dyslipidemia   Anemia, unspecified   Discharge Condition: Stable  Diet recommendation: Heart healthy/carb modified  Filed Weights   08/20/20 0340 08/21/20 0432 08/24/20 0500  Weight: 110 kg 110.2 kg 114.5 kg    History of present illness:  65 y.o.femalePMHx MORBID OBESITY, has not been to a doctor in greater than 10 years and currently not on any prescribed medications   Presents to the emergency department for chief concerns of worsening shortness of breath for the past 2 days. No chest pain. No obvious VTE risk factor, no traveling or prolonged immobilization. No history of active cancer. CTA with bilateral massive PE with right heart strain. Venous Doppler was positive for bilateral DVTs. She was taken to the OR by vascular surgeon for thrombectomy and thrombolysis, IVC filter was also placed. Patient is unvaccinated and Covid test came back negative. She will need outpatient oncologic work-up, as having some postmenopausal bleeding. She does endorse that she will have a PCP establish care appointment on 08/30/2020. PT recommending SNF-waiting for placement  Hospital Course:  See above  Procedures: 2/11Selective injection right subsegmental pulmonary arteries 2. Selective injection left subsegmental pulmonary artery 3. Infusion of TPA for thrombolysis 4. Mechanical thrombectomy pulmonary emboli using deep number device 5.Placement of infrarenal inferior vena cava filter 2/12 Echocardiogram;Left Ventricle: LVEF;60 to 65%.  -Left ventricular diastolic parameters are consistent with Grade I diastolic dysfunction (impaired relaxation).       Consultations: Vascular surgery   Antibiotics Anti-infectives (From  admission, onward)   Start     Ordered Stop   08/19/20 0600  ceFAZolin (ANCEF) IVPB 1 g/50 mL premix       Note to Pharmacy: To be given in specials   08/18/20 1624 08/18/20 1746       Discharge Exam: Vitals:   08/24/20 0500 08/24/20 0750 08/24/20 1030 08/24/20 1118  BP:  118/71  114/72  Pulse:  91  83  Resp:  17  15  Temp:  97.9 F (36.6 C)  97.7 F (36.5 C)  TempSrc:  Oral    SpO2:  90% 90% 94%  Weight: 114.5 kg     Height:        General: A/O x4 No acute respiratory distress Eyes: negative scleral hemorrhage, negative anisocoria, negative icterus ENT: Negative Runny nose, negative gingival bleeding, Neck:  Negative scars, masses, torticollis, lymphadenopathy, JVD Lungs: Clear to auscultation bilaterally without wheezes or crackles Cardiovascular: Regular rate and rhythm without murmur gallop or rub normal S1 and S2 Abdomen: MORBIDLY OBESE, negative abdominal pain, nondistended, positive soft, bowel sounds, no rebound, no ascites, no appreciable mass   Discharge Instructions   Allergies as of 08/24/2020   No Known Allergies     Medication List    TAKE these medications   losartan 25 MG tablet Commonly known as: COZAAR Take 1 tablet (25 mg total) by mouth daily. Start taking on: August 25, 2020   metFORMIN 500 MG tablet Commonly known as: GLUCOPHAGE Take 1 tablet (500 mg total) by mouth 2 (two) times daily with a meal.   nystatin powder Commonly known as: MYCOSTATIN/NYSTOP Apply topically 2 (two) times daily.   ondansetron 4 MG tablet Commonly known as: ZOFRAN Take 1 tablet (4 mg total) by mouth every 6 (six) hours as needed for nausea.   Rivaroxaban Stater Pack (15 mg and 20 mg) Commonly known as: XARELTO STARTER PACK Follow package directions: Take one 15mg  tablet by mouth twice a day. On day 22, switch to one 20mg  tablet once a day. Take with food.   rosuvastatin 10 MG  tablet Commonly known as: CRESTOR Take 1 tablet (10 mg total) by mouth daily. Start taking on: August 25, 2020      No Known Allergies  Contact information for follow-up providers    Schnier, Dolores Lory, MD Follow up in 2 month(s).   Specialties: Vascular Surgery, Cardiology, Radiology, Vascular Surgery Why: Can see Schnier or Arna Medici. Discuss IVC filter removal.  Contact information: [place] [address] [phone]            Contact information for after-discharge care    [facility] .   Service: Skilled Nursing Contact information: [place] [phone]                   The results of significant diagnostics from this hospitalization (including imaging, microbiology, ancillary and laboratory) are listed below for reference.    Significant Diagnostic Studies: DG Chest 2 View  Result Date: 08/18/2020 CLINICAL DATA:  Worsening shortness of breath over the past 2 days. EXAM: CHEST - 2 VIEW COMPARISON:  Left rib x-rays dated January 29, 2007. FINDINGS: The heart size and mediastinal contours are within normal limits. Both lungs are clear. The visualized skeletal structures are unremarkable. Mild elevation of the right hemidiaphragm. IMPRESSION: 1. No acute cardiopulmonary disease. Electronically Signed   By: Titus Dubin  M.D.   On: 08/18/2020 13:15   CT Angio Chest PE W and/or Wo Contrast  Result Date: 08/18/2020 CLINICAL DATA:  Pt to ED via [facility] from home for shortness of breath. Pt states that this started 1.5 days ago and has gotten worse. Pt states that she has not been to the doctor in about 7 to 10 years. States that she does not have any medical history that she is aware of. Pt is taking rapid, shallow breaths in triage. Pt SpO2 94% on room air. Pt states that shortness of breath is worse when she is up and  moving around. Pt breathing is labored. EXAM: CT ANGIOGRAPHY CHEST WITH CONTRAST TECHNIQUE: Multidetector CT imaging of the chest was performed using the standard protocol during bolus administration of intravenous contrast. Multiplanar CT image reconstructions and MIPs were obtained to evaluate the vascular anatomy. CONTRAST:  25mL OMNIPAQUE IOHEXOL 350 MG/ML SOLN COMPARISON:  Current chest radiograph. FINDINGS: Cardiovascular: There are significant bilateral pulmonary emboli. A saddle embolus extends across the posterior aspect of the main pulmonary artery into the right and left pulmonary arteries. Additional emboli noted in the distal right and left main pulmonary arteries, extending into the inter lobar pulmonary artery on the right and to into segmental branches to both upper and lower lobes and the right middle lobe. There is evidence of right heart strain with the RV/LV ratio, 4.9 divided by 3.0 = 1.6. Heart is top-normal in size. No pericardial effusion. Aorta is normal in caliber. Mild aortic atherosclerotic calcifications across the arch and descending portions. Mediastinum/Nodes: No neck base, axillary, mediastinal or hilar masses or enlarged lymph nodes. Trachea and esophagus are unremarkable. Lungs/Pleura: Minor areas of relative increased attenuation consistent with vascular shunting. No evidence of pneumonia or pulmonary edema. No lung mass or nodule. No pleural effusion or pneumothorax. Upper Abdomen: No acute or significant abnormality. Musculoskeletal: No fracture or acute finding. No bone lesion. No chest wall masses. Review of the MIP images confirms the above findings. IMPRESSION: 1. Extensive bilateral pulmonary emboli as detailed above. Positive for acute PE with CTevidence of right heart strain (RV/LV Ratio = 1.6) consistent with at least submassive (intermediate risk) PE. The presence of right heart strain has been associated with an increased risk of morbidity and mortality. Critical  Value/emergent results were called by telephone at the time of interpretation on 08/18/2020 at 3:11 pm to provider [facility] , who verbally acknowledged these results. 2. No other acute abnormality. 3. Mild aortic atherosclerosis. Aortic Atherosclerosis (ICD10-I70.0). Electronically Signed   By: Lajean Manes M.D.   On: 08/18/2020 15:11   PERIPHERAL VASCULAR CATHETERIZATION  Result Date: 08/18/2020 See op note  US Venous Img Lower Unilateral Left (DVT)  Result Date: 08/18/2020 CLINICAL DATA:  Shortness of breath and lower extremity swelling. EXAM: LEFT LOWER EXTREMITY VENOUS DOPPLER ULTRASOUND TECHNIQUE: Gray-scale sonography with compression, as well as color and duplex ultrasound, were performed to evaluate the deep venous system(s) from the level of the common femoral vein through the popliteal and proximal calf veins. COMPARISON:  None. FINDINGS: VENOUS Normal compressibility of the LEFT common femoral and left superficial femoral veins. Abnormal compressibility is seen involving the LEFT popliteal vein, as well as the visualized LEFT calf veins. Visualized portions of profunda femoral vein and great saphenous vein unremarkable. Intraluminal filling defects suggestive of DVT are seen within the LEFT popliteal vein and LEFT calf veins on grayscale and color Doppler imaging. Abnormal Doppler waveforms are also seen within these regions. OTHER None. Limitations:  none IMPRESSION: Findings consistent with nonocclusive DVT within the LEFT popliteal, LEFT posterior tibial and LEFT peroneal veins. Electronically Signed   By: Virgina Norfolk M.D.   On: 08/18/2020 21:53   US Venous Img Lower Unilateral Right (DVT)  Result Date: 08/19/2020 CLINICAL DATA:  Pulmonary embolism EXAM: RIGHT LOWER EXTREMITY VENOUS DOPPLER ULTRASOUND TECHNIQUE: Gray-scale sonography with compression, as well as color and duplex ultrasound, were performed to evaluate the deep venous system(s) from the level of the common femoral vein  through the popliteal and proximal calf veins. COMPARISON:  None. FINDINGS: VENOUS Normal compressibility of the common femoral, superficial femoral, and popliteal veins, as well as the visualized calf veins. Visualized portions of profunda femoral vein and great saphenous vein unremarkable. No filling defects to suggest DVT on grayscale or color Doppler imaging. Doppler waveforms show normal direction of venous flow, normal respiratory plasticity and response to augmentation. Limited views of the contralateral common femoral vein are unremarkable. OTHER None. Limitations: none IMPRESSION: No evidence for DVT involving the right lower extremity. Please see prior left lower extremity report for further details of the left lower extremity. Electronically Signed   By: Constance Holster M.D.   On: 08/19/2020 14:55   ECHOCARDIOGRAM COMPLETE  Result Date: 08/20/2020    ECHOCARDIOGRAM REPORT   Patient Name:   Courtney Mack Date of Exam: 08/19/2020 Medical Rec #:  [id number]          Height:       58.0 in Accession #:    [id number]         Weight:       247.7 lb Date of Birth:  May 08, 1956          BSA:          1.994 m Patient Age:    65 years           BP:           130/77 mmHg Patient Gender: F                  HR:           99 bpm. Exam Location:  [facility] Procedure: 2D Echo Indications:     Pulmonary Embolus I26.99  History:         Patient has no prior history of Echocardiogram examinations.  Sonographer:     Arville Go RDCS Referring Phys:  [id number] AMY N COX Diagnosing Phys: Jenkins Rouge MD  Sonographer Comments: Technically difficult study due to poor echo windows and patient is morbidly obese. Image acquisition challenging due to patient body habitus and Image acquisition challenging due to respiratory motion. IMPRESSIONS  1. Left ventricular ejection fraction, by estimation, is 60 to 65%. The left ventricle has normal function. The left ventricle has no regional wall motion abnormalities. Left ventricular  diastolic parameters are consistent with Grade I diastolic dysfunction (impaired relaxation).  2. Right ventricular systolic function is mildly reduced. The right ventricular size is mildly enlarged.  3. The mitral valve is normal in structure. No evidence of mitral valve regurgitation. No evidence of mitral stenosis.  4. The aortic valve is normal in structure. Aortic valve regurgitation is not visualized. No aortic stenosis is present.  5. The inferior vena cava is normal in size with greater than 50% respiratory variability, suggesting right atrial pressure of 3 mmHg. FINDINGS  Left Ventricle: Left ventricular ejection fraction, by estimation, is 60 to 65%. The left ventricle has normal function. The left ventricle has no  regional wall motion abnormalities. The left ventricular internal cavity size was normal in size. There is  no left ventricular hypertrophy. Left ventricular diastolic parameters are consistent with Grade I diastolic dysfunction (impaired relaxation). Right Ventricle: The right ventricular size is mildly enlarged. No increase in right ventricular wall thickness. Right ventricular systolic function is mildly reduced. Left Atrium: Left atrial size was normal in size. Right Atrium: Right atrial size was normal in size. Pericardium: There is no evidence of pericardial effusion. Mitral Valve: The mitral valve is normal in structure. No evidence of mitral valve regurgitation. No evidence of mitral valve stenosis. Tricuspid Valve: The tricuspid valve is normal in structure. Tricuspid valve regurgitation is trivial. No evidence of tricuspid stenosis. Aortic Valve: The aortic valve is normal in structure. Aortic valve regurgitation is not visualized. No aortic stenosis is present. Pulmonic Valve: The pulmonic valve was normal in structure. Pulmonic valve regurgitation is not visualized. No evidence of pulmonic stenosis. Aorta: The aortic root is normal in size and structure. Venous: The inferior vena  cava is normal in size with greater than 50% respiratory variability, suggesting right atrial pressure of 3 mmHg. IAS/Shunts: No atrial level shunt detected by color flow Doppler.  LEFT VENTRICLE PLAX 2D LVIDd:         3.81 cm  Diastology LVIDs:         2.69 cm  LV e' medial:   3.92 cm/s LV PW:         1.14 cm  LV E/e' medial: 11.7 LV IVS:        0.94 cm LVOT diam:     1.90 cm LV SV:         33 LV SV Index:   16 LVOT Area:     2.84 cm  RIGHT VENTRICLE RV Basal diam:  3.79 cm RV S prime:     11.10 cm/s LEFT ATRIUM           Index      RIGHT ATRIUM           Index LA diam:      3.20 cm 1.60 cm/m RA Area:     11.70 cm LA Vol (A4C): 19.9 ml 9.98 ml/m RA Volume:   27.40 ml  13.74 ml/m  AORTIC VALVE LVOT Vmax:   102.00 cm/s LVOT Vmean:  56.800 cm/s LVOT VTI:    0.115 m  AORTA Ao Root diam: 3.40 cm Ao Asc diam:  3.20 cm MITRAL VALVE MV Area (PHT): 7.29 cm    SHUNTS MV Decel Time: 104 msec    Systemic VTI:  0.12 m MV E velocity: 45.80 cm/s  Systemic Diam: 1.90 cm MV A velocity: 93.40 cm/s MV E/A ratio:  0.49 Jenkins Rouge MD Electronically signed by Jenkins Rouge MD Signature Date/Time: 08/20/2020/10:38:24 AM    Final     Microbiology: Recent Results (from the past 240 hour(s))  Resp Panel by RT-PCR (Flu A&B, Covid) Nasopharyngeal Swab     Status: None   Collection Time: 08/18/20  1:24 PM   Specimen: Nasopharyngeal Swab; Nasopharyngeal(NP) swabs in vial transport medium  Result Value Ref Range Status   SARS Coronavirus 2 by RT PCR NEGATIVE NEGATIVE Final    Comment: (NOTE) SARS-CoV-2 target nucleic acids are NOT DETECTED.  The SARS-CoV-2 RNA is generally detectable in upper respiratory specimens during the acute phase of infection. The lowest concentration of SARS-CoV-2 viral copies this assay can detect is 138 copies/mL. A negative result does not preclude SARS-Cov-2 infection and should  not be used as the sole basis for treatment or other patient management decisions. A negative result may occur with   improper specimen collection/handling, submission of specimen other than nasopharyngeal swab, presence of viral mutation(s) within the areas targeted by this assay, and inadequate number of viral copies(<138 copies/mL). A negative result must be combined with clinical observations, patient history, and epidemiological information. The expected result is Negative.  Fact Sheet for Patients:  EntrepreneurPulse.com.au  Fact Sheet for Healthcare Providers:  IncredibleEmployment.be  This test is no t yet approved or cleared by the [place] FDA and  has been authorized for detection and/or diagnosis of SARS-CoV-2 by FDA under an Emergency Use Authorization (EUA). This EUA will remain  in effect (meaning this test can be used) for the duration of the COVID-19 declaration under Section 564(b)(1) of the Act, 21 U.S.C.section 360bbb-3(b)(1), unless the authorization is terminated  or revoked sooner.       Influenza A by PCR NEGATIVE NEGATIVE Final   Influenza B by PCR NEGATIVE NEGATIVE Final    Comment: (NOTE) The Xpert Xpress SARS-CoV-2/FLU/RSV plus assay is intended as an aid in the diagnosis of influenza from Nasopharyngeal swab specimens and should not be used as a sole basis for treatment. Nasal washings and aspirates are unacceptable for Xpert Xpress SARS-CoV-2/FLU/RSV testing.  Fact Sheet for Patients: EntrepreneurPulse.com.au  Fact Sheet for Healthcare Providers: IncredibleEmployment.be  This test is not yet approved or cleared by the [place] FDA and has been authorized for detection and/or diagnosis of SARS-CoV-2 by FDA under an Emergency Use Authorization (EUA). This EUA will remain in effect (meaning this test can be used) for the duration of the COVID-19 declaration under Section 564(b)(1) of the Act, 21 U.S.C. section 360bbb-3(b)(1), unless the authorization is terminated  or revoked.  Performed at [facility], [address] [place], [place] [address]      Labs: Basic Metabolic Panel: Recent Labs  Lab 08/18/20 1159 08/19/20 0303 08/20/20 0045 08/24/20 0643  NA 138 138  --  136  K 4.0 4.4  --  4.0  CL 102 106  --  104  CO2 24 22  --  25  GLUCOSE 225* 320*  --  148*  BUN 16 18  --  16  CREATININE 0.92 0.83 0.92 0.86  CALCIUM 9.4 8.9  --  8.6*  MG  --   --   --  2.0  PHOS  --   --   --  3.4   Liver Function Tests: Recent Labs  Lab 08/18/20 1159 08/24/20 0643  AST 23 19  ALT 11 12  ALKPHOS 70 61  BILITOT 0.9 1.3*  PROT 7.8 6.3*  ALBUMIN 3.5 3.0*   No results for input(s): LIPASE, AMYLASE in the last 168 hours. No results for input(s): AMMONIA in the last 168 hours. CBC: Recent Labs  Lab 08/18/20 1159 08/19/20 0303 08/20/20 0045 08/24/20 0643  WBC 8.8 9.1 14.0* 8.7  NEUTROABS 6.5  --   --   --   HGB 12.3 10.9* 10.1* 9.7*  HCT 39.7 34.9* 32.7* 31.4*  MCV 83.2 83.1 83.6 82.6  PLT 269 232 263 236   Cardiac Enzymes: No results for input(s): CKTOTAL, CKMB, CKMBINDEX, TROPONINI in the last 168 hours. BNP: BNP (last 3 results) Recent Labs    08/18/20 1159  BNP 720.6*    ProBNP (last 3 results) No results for input(s): PROBNP in the last 8760 hours.  CBG: Recent Labs  Lab 08/23/20 1155 08/23/20  [place] 08/23/20 2107 08/24/20 0801 08/24/20 1137  GLUCAP 137* 138* 160* 137* 155*       Signed:  Dia Crawford, MD [facility]

## 2020-08-24 NOTE — Progress Notes (Signed)
Nutrition Brief Note  Last Weight  Most recent update: 08/24/2020  5:10 AM   Weight  114.5 kg (252 lb 6.8 oz)           RD received consult for assessment of nutrition status and request for diet education.   Pt told intern that at home she eats 3 meals a day and a few snacks and noted that she typically has a good appetite at home. She did mention that the few days before being admitted she had a decreased appetite and knew something wasn't right.   Pt's notes indicate that pt has been consuming approximately 100% of meals since 02/15. Pt told intern that she felt she had been eating good since being in the hospital. Pt mentioned that she had a good appetite and felt she was eating approximately 75% of her meals.   Pt does not meet criteria for malnutrition at this time.   Nutrition Focused Physical Exam  Flowsheet Row Most Recent Value  Orbital Region No depletion  Upper Arm Region No depletion  Thoracic and Lumbar Region No depletion  Buccal Region No depletion  Temple Region No depletion  Clavicle Bone Region No depletion  Clavicle and Acromion Bone Region No depletion  Scapular Bone Region No depletion  Dorsal Hand No depletion  Patellar Region No depletion  Anterior Thigh Region No depletion  Posterior Calf Region No depletion  Edema (RD Assessment) Mild  Hair Reviewed  Eyes Reviewed  Mouth Reviewed  Skin Reviewed  Nails Reviewed    Body mass index is 52.76 kg/m. Patient meets criteria for morbid obesity based on current BMI.   Current diet order is Carb Modified, patient is consuming approximately 75-100% of meals at this time. Labs and medications reviewed.   Provided diet education (note to follow). No further nutrition interventions warranted at this time. If nutrition issues arise, please consult RD.   Salvadore Oxford, Dietetic Intern 08/24/2020 10:19 AM

## 2020-08-24 NOTE — Progress Notes (Incomplete)
PROGRESS NOTE    Courtney Mack  XBD:[id number] DOB: [birth year] DOA: 08/18/2020 PCP: Gladstone Lighter, MD     Brief Narrative:  65 y.o.femalePMHx MORBID OBESITY, has not been to a doctor in greater than 10 years and currently not on any prescribed medications   Presents to the emergency department for chief concerns of worsening shortness of breath for the past 2 days.  No chest pain. No obvious VTE risk factor, no traveling or prolonged immobilization.  No history of active cancer. CTA with bilateral massive PE with right heart strain. Venous Doppler was positive for bilateral DVTs. She was taken to the OR by vascular surgeon for thrombectomy and thrombolysis, IVC filter was also placed. Patient is unvaccinated and Covid test came back negative. She will need outpatient oncologic work-up, as having some postmenopausal bleeding. She does endorse that she will have a PCP establish care appointment on 08/30/2020. PT recommending SNF-waiting for placement   Subjective: 2/17 afebrile overnight,    afebrile overnight, A/O x4.  Negative previous history of PE or DVT.  States has not been on any medication until this hospitalization.   Assessment & Plan: Covid vaccination; unvaccinated, requests vaccination; 2/16 ordered   Principal Problem:   Pulmonary embolism ([place]) Active Problems:   Vaginal bleeding, abnormal   Obesity, morbid, BMI 50 or higher ([place])   Acute respiratory failure with hypoxia (HCC)   Morbidly obese (HCC)   Essential hypertension   New onset type 2 diabetes mellitus ([place])   Dyslipidemia   Acute hypoxic respiratory failure secondary to massive bilateral PE with right heart strain.   -S/p suction embolectomy and thrombolysis by vascular surgery.   -IVC filter was placed.  Venous -Doppler positive for bilateral LE DVTs -Continue Xarelto -Currently on room air when sedentary -Echocardiogram-normal EF with grade 1 diastolic dysfunction. -Patient needs  oncology work-up as an outpatient.  Secondary to postmenopausal bleed.  States already has a appointment with her OB/GYN -Awaiting SNF placement -2/16 ambulatory SPO2 pending  Acute diastolic CHF -Strict in and out -Daily weight   DM type II new onset -Patient states had gestational diabetes -2/13 hemoglobin A1c= 10.1 -2/16 diabetic coordinator consult pending -2/16 diabetic nutrition consult pending -Dependent on renal findings in a.m. we will start patient on Metformin to ensure she tolerates   Dyslipidemia.  -Lipid profile with elevated total cholesterol, triglyceride and LDL and low HDL. -Started her on Crestor 10 mg daily.  Essential HTN - Losartan at 25 mg daily  Morbidly obese/Body mass index is 50.79 kg/m.  -That makes her high risk for diabetes, hypertension along with other comorbidities. -She was counseled for weight loss and will need continuous counseling by PCP.  Anemia*****   DVT prophylaxis: Xarelto Code Status: Full Family Communication:  Status is: Inpatient    Dispo: The patient is from: Home              Anticipated d/c is to: SNF              Anticipated d/c date is: 2/18              Patient currently unstable      Consultants:    Procedures/Significant Events:    I have personally reviewed and interpreted all radiology studies and my findings are as above.  VENTILATOR SETTINGS: Room air 2/17 SPO2 94%   Cultures   Antimicrobials:    Devices    LINES / TUBES:      Continuous Infusions:   Objective: Vitals:  08/24/20 0040 08/24/20 0451 08/24/20 0500 08/24/20 0750  BP: (!) 106/57 108/63  118/71  Pulse: 91 91  91  Resp: (!) 22 (!) 22  17  Temp: 98.3 F (36.8 C) 97.8 F (36.6 C)  97.9 F (36.6 C)  TempSrc: Oral Oral  Oral  SpO2: 93% 94%  90%  Weight:   114.5 kg   Height:        Intake/Output Summary (Last 24 hours) at 08/24/2020 0805 Last data filed at 08/23/2020 1835 Gross per 24 hour  Intake 360 ml   Output -  Net 360 ml   Filed Weights   08/20/20 0340 08/21/20 0432 08/24/20 0500  Weight: 110 kg 110.2 kg 114.5 kg    Examination:  General: A/O x4 No acute respiratory distress Eyes: negative scleral hemorrhage, negative anisocoria, negative icterus ENT: Negative Runny nose, negative gingival bleeding, Neck:  Negative scars, masses, torticollis, lymphadenopathy, JVD Lungs: Clear to auscultation bilaterally without wheezes or crackles Cardiovascular: Regular rate and rhythm without murmur gallop or rub normal S1 and S2 Abdomen: MORBIDLY OBESE, negative abdominal pain, nondistended, positive soft, bowel sounds, no rebound, no ascites, no appreciable mass Extremities: No significant cyanosis, clubbing, or edema bilateral lower extremities Skin: Negative rashes, lesions, ulcers Psychiatric:  Negative depression, negative anxiety, negative fatigue, negative mania  Central nervous system:  Cranial nerves II through XII intact, tongue/uvula midline, all extremities muscle strength 5/5, sensation intact throughout, negative dysarthria, negative expressive aphasia, negative receptive aphasia.  .     Data Reviewed: Care during the described time interval was provided by me .  I have reviewed this patient's available data, including medical history, events of note, physical examination, and all test results as part of my evaluation.  CBC: Recent Labs  Lab 08/18/20 1159 08/19/20 0303 08/20/20 0045 08/24/20 0643  WBC 8.8 9.1 14.0* 8.7  NEUTROABS 6.5  --   --   --   HGB 12.3 10.9* 10.1* 9.7*  HCT 39.7 34.9* 32.7* 31.4*  MCV 83.2 83.1 83.6 82.6  PLT 269 232 263 009   Basic Metabolic Panel: Recent Labs  Lab 08/18/20 1159 08/19/20 0303 08/20/20 0045 08/24/20 0643  NA 138 138  --  136  K 4.0 4.4  --  4.0  CL 102 106  --  104  CO2 24 22  --  25  GLUCOSE 225* 320*  --  148*  BUN 16 18  --  16  CREATININE 0.92 0.83 0.92 0.86  CALCIUM 9.4 8.9  --  8.6*  MG  --   --   --  2.0   PHOS  --   --   --  3.4   GFR: Estimated Creatinine Clearance: 72.4 mL/min (by C-G formula based on SCr of 0.86 mg/dL). Liver Function Tests: Recent Labs  Lab 08/18/20 1159 08/24/20 0643  AST 23 19  ALT 11 12  ALKPHOS 70 61  BILITOT 0.9 1.3*  PROT 7.8 6.3*  ALBUMIN 3.5 3.0*   No results for input(s): LIPASE, AMYLASE in the last 168 hours. No results for input(s): AMMONIA in the last 168 hours. Coagulation Profile: Recent Labs  Lab 08/18/20 2229  INR 1.3*   Cardiac Enzymes: No results for input(s): CKTOTAL, CKMB, CKMBINDEX, TROPONINI in the last 168 hours. BNP (last 3 results) No results for input(s): PROBNP in the last 8760 hours. HbA1C: No results for input(s): HGBA1C in the last 72 hours. CBG: Recent Labs  Lab 08/23/20 0742 08/23/20 1155 08/23/20 1544 08/23/20 2107 08/24/20 0801  GLUCAP 149* 137* 138* 160* 137*   Lipid Profile: No results for input(s): CHOL, HDL, LDLCALC, TRIG, CHOLHDL, LDLDIRECT in the last 72 hours. Thyroid Function Tests: No results for input(s): TSH, T4TOTAL, FREET4, T3FREE, THYROIDAB in the last 72 hours. Anemia Panel: No results for input(s): VITAMINB12, FOLATE, FERRITIN, TIBC, IRON, RETICCTPCT in the last 72 hours. Sepsis Labs: Recent Labs  Lab 08/18/20 1159  PROCALCITON <0.10    Recent Results (from the past 240 hour(s))  Resp Panel by RT-PCR (Flu A&B, Covid) Nasopharyngeal Swab     Status: None   Collection Time: 08/18/20  1:24 PM   Specimen: Nasopharyngeal Swab; Nasopharyngeal(NP) swabs in vial transport medium  Result Value Ref Range Status   SARS Coronavirus 2 by RT PCR NEGATIVE NEGATIVE Final    Comment: (NOTE) SARS-CoV-2 target nucleic acids are NOT DETECTED.  The SARS-CoV-2 RNA is generally detectable in upper respiratory specimens during the acute phase of infection. The lowest concentration of SARS-CoV-2 viral copies this assay can detect is 138 copies/mL. A negative result does not preclude SARS-Cov-2 infection  and should not be used as the sole basis for treatment or other patient management decisions. A negative result may occur with  improper specimen collection/handling, submission of specimen other than nasopharyngeal swab, presence of viral mutation(s) within the areas targeted by this assay, and inadequate number of viral copies(<138 copies/mL). A negative result must be combined with clinical observations, patient history, and epidemiological information. The expected result is Negative.  Fact Sheet for Patients:  EntrepreneurPulse.com.au  Fact Sheet for Healthcare Providers:  IncredibleEmployment.be  This test is no t yet approved or cleared by the [place] FDA and  has been authorized for detection and/or diagnosis of SARS-CoV-2 by FDA under an Emergency Use Authorization (EUA). This EUA will remain  in effect (meaning this test can be used) for the duration of the COVID-19 declaration under Section 564(b)(1) of the Act, 21 U.S.C.section 360bbb-3(b)(1), unless the authorization is terminated  or revoked sooner.       Influenza A by PCR NEGATIVE NEGATIVE Final   Influenza B by PCR NEGATIVE NEGATIVE Final    Comment: (NOTE) The Xpert Xpress SARS-CoV-2/FLU/RSV plus assay is intended as an aid in the diagnosis of influenza from Nasopharyngeal swab specimens and should not be used as a sole basis for treatment. Nasal washings and aspirates are unacceptable for Xpert Xpress SARS-CoV-2/FLU/RSV testing.  Fact Sheet for Patients: EntrepreneurPulse.com.au  Fact Sheet for Healthcare Providers: IncredibleEmployment.be  This test is not yet approved or cleared by the [place] FDA and has been authorized for detection and/or diagnosis of SARS-CoV-2 by FDA under an Emergency Use Authorization (EUA). This EUA will remain in effect (meaning this test can be used) for the duration of the COVID-19 declaration  under Section 564(b)(1) of the Act, 21 U.S.C. section 360bbb-3(b)(1), unless the authorization is terminated or revoked.  Performed at [facility], [address], [place], [place] [address]          Radiology Studies: No results found.      Scheduled Meds: . COVID-19 mRNA vaccine (Moderna)  0.5 mL Intramuscular Once  . insulin aspart  0-15 Units Subcutaneous TID WC  . insulin aspart  0-5 Units Subcutaneous QHS  . losartan  25 mg Oral Daily  . nystatin   Topical BID  . Rivaroxaban  15 mg Oral BID WC   Followed by  . [START ON 09/10/2020] rivaroxaban  20 mg Oral Q supper  . rosuvastatin  10  mg Oral Daily   Continuous Infusions:   LOS: 6 days    Time spent:40 min    WOODS, Geraldo Docker, MD [facility]   If 7PM-7AM, please contact night-coverage 08/24/2020, 8:05 AM

## 2020-08-24 NOTE — Care Management Important Message (Signed)
Important Message  Patient Details  Name: Courtney Mack MRN: [id number] Date of Birth: July 11, 1955   Medicare Important Message Given:  Yes     [name] A Anais Koenen 08/24/2020, 1:05 PM

## 2020-08-24 NOTE — Progress Notes (Signed)
Report called to angela at [facility]

## 2020-08-25 DIAGNOSIS — I2699 Other pulmonary embolism without acute cor pulmonale: Secondary | ICD-10-CM | POA: Diagnosis not present

## 2020-08-25 DIAGNOSIS — N950 Postmenopausal bleeding: Secondary | ICD-10-CM | POA: Diagnosis not present

## 2020-08-25 DIAGNOSIS — E118 Type 2 diabetes mellitus with unspecified complications: Secondary | ICD-10-CM | POA: Diagnosis not present

## 2020-08-25 HISTORY — DX: Other pulmonary embolism without acute cor pulmonale: I26.99

## 2020-09-05 ENCOUNTER — Encounter: Payer: Self-pay | Admitting: Emergency Medicine

## 2020-09-05 ENCOUNTER — Other Ambulatory Visit: Payer: Self-pay

## 2020-09-05 DIAGNOSIS — I2782 Chronic pulmonary embolism: Secondary | ICD-10-CM | POA: Diagnosis not present

## 2020-09-05 DIAGNOSIS — E785 Hyperlipidemia, unspecified: Secondary | ICD-10-CM | POA: Diagnosis present

## 2020-09-05 DIAGNOSIS — Z833 Family history of diabetes mellitus: Secondary | ICD-10-CM | POA: Diagnosis not present

## 2020-09-05 DIAGNOSIS — Z7401 Bed confinement status: Secondary | ICD-10-CM | POA: Diagnosis not present

## 2020-09-05 DIAGNOSIS — I2602 Saddle embolus of pulmonary artery with acute cor pulmonale: Secondary | ICD-10-CM | POA: Diagnosis not present

## 2020-09-05 DIAGNOSIS — Z95828 Presence of other vascular implants and grafts: Secondary | ICD-10-CM

## 2020-09-05 DIAGNOSIS — R031 Nonspecific low blood-pressure reading: Secondary | ICD-10-CM | POA: Diagnosis not present

## 2020-09-05 DIAGNOSIS — Z87891 Personal history of nicotine dependence: Secondary | ICD-10-CM | POA: Diagnosis not present

## 2020-09-05 DIAGNOSIS — N8500 Endometrial hyperplasia, unspecified: Secondary | ICD-10-CM | POA: Diagnosis not present

## 2020-09-05 DIAGNOSIS — Z79899 Other long term (current) drug therapy: Secondary | ICD-10-CM | POA: Diagnosis not present

## 2020-09-05 DIAGNOSIS — C541 Malignant neoplasm of endometrium: Principal | ICD-10-CM | POA: Diagnosis present

## 2020-09-05 DIAGNOSIS — I10 Essential (primary) hypertension: Secondary | ICD-10-CM | POA: Diagnosis present

## 2020-09-05 DIAGNOSIS — Z20822 Contact with and (suspected) exposure to covid-19: Secondary | ICD-10-CM | POA: Diagnosis present

## 2020-09-05 DIAGNOSIS — I959 Hypotension, unspecified: Secondary | ICD-10-CM | POA: Diagnosis not present

## 2020-09-05 DIAGNOSIS — Z6841 Body Mass Index (BMI) 40.0 and over, adult: Secondary | ICD-10-CM

## 2020-09-05 DIAGNOSIS — C55 Malignant neoplasm of uterus, part unspecified: Secondary | ICD-10-CM | POA: Diagnosis not present

## 2020-09-05 DIAGNOSIS — R791 Abnormal coagulation profile: Secondary | ICD-10-CM | POA: Diagnosis present

## 2020-09-05 DIAGNOSIS — J9601 Acute respiratory failure with hypoxia: Secondary | ICD-10-CM | POA: Diagnosis not present

## 2020-09-05 DIAGNOSIS — N950 Postmenopausal bleeding: Secondary | ICD-10-CM | POA: Diagnosis present

## 2020-09-05 DIAGNOSIS — Z7951 Long term (current) use of inhaled steroids: Secondary | ICD-10-CM

## 2020-09-05 DIAGNOSIS — N939 Abnormal uterine and vaginal bleeding, unspecified: Secondary | ICD-10-CM | POA: Diagnosis not present

## 2020-09-05 DIAGNOSIS — E119 Type 2 diabetes mellitus without complications: Secondary | ICD-10-CM | POA: Diagnosis present

## 2020-09-05 DIAGNOSIS — Z7901 Long term (current) use of anticoagulants: Secondary | ICD-10-CM | POA: Diagnosis not present

## 2020-09-05 DIAGNOSIS — Z48812 Encounter for surgical aftercare following surgery on the circulatory system: Secondary | ICD-10-CM | POA: Diagnosis not present

## 2020-09-05 DIAGNOSIS — Z7984 Long term (current) use of oral hypoglycemic drugs: Secondary | ICD-10-CM | POA: Diagnosis not present

## 2020-09-05 DIAGNOSIS — I2699 Other pulmonary embolism without acute cor pulmonale: Secondary | ICD-10-CM | POA: Diagnosis not present

## 2020-09-05 DIAGNOSIS — Z86711 Personal history of pulmonary embolism: Secondary | ICD-10-CM

## 2020-09-05 DIAGNOSIS — Z86718 Personal history of other venous thrombosis and embolism: Secondary | ICD-10-CM | POA: Diagnosis not present

## 2020-09-05 DIAGNOSIS — I110 Hypertensive heart disease with heart failure: Secondary | ICD-10-CM | POA: Diagnosis not present

## 2020-09-05 DIAGNOSIS — D62 Acute posthemorrhagic anemia: Secondary | ICD-10-CM | POA: Diagnosis present

## 2020-09-05 DIAGNOSIS — I825Y3 Chronic embolism and thrombosis of unspecified deep veins of proximal lower extremity, bilateral: Secondary | ICD-10-CM | POA: Diagnosis not present

## 2020-09-05 DIAGNOSIS — R9389 Abnormal findings on diagnostic imaging of other specified body structures: Secondary | ICD-10-CM | POA: Diagnosis not present

## 2020-09-05 DIAGNOSIS — D649 Anemia, unspecified: Secondary | ICD-10-CM | POA: Diagnosis not present

## 2020-09-05 DIAGNOSIS — M2550 Pain in unspecified joint: Secondary | ICD-10-CM | POA: Diagnosis not present

## 2020-09-05 DIAGNOSIS — R58 Hemorrhage, not elsewhere classified: Secondary | ICD-10-CM | POA: Diagnosis not present

## 2020-09-05 DIAGNOSIS — I5030 Unspecified diastolic (congestive) heart failure: Secondary | ICD-10-CM | POA: Diagnosis not present

## 2020-09-05 LAB — COMPREHENSIVE METABOLIC PANEL
ALT: 10 U/L (ref 0–44)
AST: 20 U/L (ref 15–41)
Albumin: 3.5 g/dL (ref 3.5–5.0)
Alkaline Phosphatase: 56 U/L (ref 38–126)
Anion gap: 6 (ref 5–15)
BUN: 11 mg/dL (ref 8–23)
CO2: 26 mmol/L (ref 22–32)
Calcium: 9 mg/dL (ref 8.9–10.3)
Chloride: 102 mmol/L (ref 98–111)
Creatinine, Ser: 0.69 mg/dL (ref 0.44–1.00)
GFR, Estimated: >60 mL/min (ref >60)
Glucose, Bld: 108 mg/dL — ABNORMAL HIGH (ref 70–99)
Potassium: 4 mmol/L (ref 3.5–5.1)
Sodium: 134 mmol/L — ABNORMAL LOW (ref 135–145)
Total Bilirubin: 1.4 mg/dL — ABNORMAL HIGH (ref 0.3–1.2)
Total Protein: 7.5 g/dL (ref 6.5–8.1)

## 2020-09-05 LAB — CBC
HCT: 30.9 % — ABNORMAL LOW (ref 36.0–46.0)
Hemoglobin: 9.5 g/dL — ABNORMAL LOW (ref 12.0–15.0)
MCH: 24.7 pg — ABNORMAL LOW (ref 26.0–34.0)
MCHC: 30.7 g/dL (ref 30.0–36.0)
MCV: 80.5 fL (ref 80.0–100.0)
Platelets: 277 10*3/uL (ref 150–400)
RBC: 3.84 MIL/uL — ABNORMAL LOW (ref 3.87–5.11)
RDW: 17 % — ABNORMAL HIGH (ref 11.5–15.5)
WBC: 7.4 10*3/uL (ref 4.0–10.5)
nRBC: 0 % (ref 0.0–0.2)

## 2020-09-05 NOTE — ED Triage Notes (Signed)
Pt to ED via EMS from [facility] c/o vaginal bleeding x2 weeks.  Pt states had blood clots and placed on Eliquis.  Bleeding became more severe today and passing clots, denies LOC, states exertional SOB.  Pt on 2L [place] from [facility] but had not been on oxygen prior.

## 2020-09-05 NOTE — ED Notes (Signed)
Blue top sent to lab with save label.

## 2020-09-06 ENCOUNTER — Inpatient Hospital Stay
Admission: EM | Admit: 2020-09-06 | Discharge: 2020-09-11 | DRG: 744 | Disposition: A | Discharge status: Skilled Nursing Facility | Payer: Medicare HMO | Attending: Family Medicine | Admitting: Family Medicine

## 2020-09-06 ENCOUNTER — Emergency Department: Payer: Medicare HMO

## 2020-09-06 ENCOUNTER — Encounter: Payer: Self-pay | Admitting: Family Medicine

## 2020-09-06 DIAGNOSIS — N950 Postmenopausal bleeding: Secondary | ICD-10-CM | POA: Diagnosis present

## 2020-09-06 DIAGNOSIS — R9389 Abnormal findings on diagnostic imaging of other specified body structures: Secondary | ICD-10-CM | POA: Diagnosis not present

## 2020-09-06 DIAGNOSIS — I825Y3 Chronic embolism and thrombosis of unspecified deep veins of proximal lower extremity, bilateral: Secondary | ICD-10-CM | POA: Diagnosis not present

## 2020-09-06 DIAGNOSIS — Z7984 Long term (current) use of oral hypoglycemic drugs: Secondary | ICD-10-CM | POA: Diagnosis not present

## 2020-09-06 DIAGNOSIS — N939 Abnormal uterine and vaginal bleeding, unspecified: Secondary | ICD-10-CM | POA: Diagnosis present

## 2020-09-06 DIAGNOSIS — Z79899 Other long term (current) drug therapy: Secondary | ICD-10-CM | POA: Diagnosis not present

## 2020-09-06 DIAGNOSIS — E119 Type 2 diabetes mellitus without complications: Secondary | ICD-10-CM

## 2020-09-06 DIAGNOSIS — N8500 Endometrial hyperplasia, unspecified: Secondary | ICD-10-CM | POA: Diagnosis not present

## 2020-09-06 DIAGNOSIS — Z86718 Personal history of other venous thrombosis and embolism: Secondary | ICD-10-CM | POA: Diagnosis not present

## 2020-09-06 DIAGNOSIS — D649 Anemia, unspecified: Secondary | ICD-10-CM | POA: Diagnosis not present

## 2020-09-06 DIAGNOSIS — C541 Malignant neoplasm of endometrium: Secondary | ICD-10-CM | POA: Diagnosis not present

## 2020-09-06 DIAGNOSIS — Z86711 Personal history of pulmonary embolism: Secondary | ICD-10-CM | POA: Diagnosis not present

## 2020-09-06 DIAGNOSIS — Z95828 Presence of other vascular implants and grafts: Secondary | ICD-10-CM | POA: Diagnosis not present

## 2020-09-06 DIAGNOSIS — Z87891 Personal history of nicotine dependence: Secondary | ICD-10-CM | POA: Diagnosis not present

## 2020-09-06 DIAGNOSIS — Z7901 Long term (current) use of anticoagulants: Secondary | ICD-10-CM | POA: Diagnosis not present

## 2020-09-06 DIAGNOSIS — I2782 Chronic pulmonary embolism: Secondary | ICD-10-CM | POA: Diagnosis not present

## 2020-09-06 DIAGNOSIS — Z20822 Contact with and (suspected) exposure to covid-19: Secondary | ICD-10-CM | POA: Diagnosis present

## 2020-09-06 DIAGNOSIS — Z833 Family history of diabetes mellitus: Secondary | ICD-10-CM | POA: Diagnosis not present

## 2020-09-06 DIAGNOSIS — I10 Essential (primary) hypertension: Secondary | ICD-10-CM | POA: Diagnosis not present

## 2020-09-06 DIAGNOSIS — D62 Acute posthemorrhagic anemia: Secondary | ICD-10-CM | POA: Diagnosis not present

## 2020-09-06 DIAGNOSIS — R791 Abnormal coagulation profile: Secondary | ICD-10-CM | POA: Diagnosis present

## 2020-09-06 DIAGNOSIS — Z6841 Body Mass Index (BMI) 40.0 and over, adult: Secondary | ICD-10-CM | POA: Diagnosis not present

## 2020-09-06 DIAGNOSIS — E66813 Obesity, class 3: Secondary | ICD-10-CM | POA: Diagnosis present

## 2020-09-06 DIAGNOSIS — C55 Malignant neoplasm of uterus, part unspecified: Secondary | ICD-10-CM | POA: Diagnosis not present

## 2020-09-06 DIAGNOSIS — I2602 Saddle embolus of pulmonary artery with acute cor pulmonale: Secondary | ICD-10-CM | POA: Diagnosis not present

## 2020-09-06 DIAGNOSIS — I2699 Other pulmonary embolism without acute cor pulmonale: Secondary | ICD-10-CM | POA: Diagnosis present

## 2020-09-06 DIAGNOSIS — I82409 Acute embolism and thrombosis of unspecified deep veins of unspecified lower extremity: Secondary | ICD-10-CM

## 2020-09-06 DIAGNOSIS — Z7951 Long term (current) use of inhaled steroids: Secondary | ICD-10-CM | POA: Diagnosis not present

## 2020-09-06 DIAGNOSIS — E785 Hyperlipidemia, unspecified: Secondary | ICD-10-CM | POA: Diagnosis present

## 2020-09-06 HISTORY — DX: Personal history of other venous thrombosis and embolism: Z86.718

## 2020-09-06 HISTORY — DX: Acute embolism and thrombosis of unspecified deep veins of unspecified lower extremity: I82.409

## 2020-09-06 LAB — MRSA PCR SCREENING: MRSA by PCR: NEGATIVE

## 2020-09-06 LAB — CBC
HCT: 27.4 % — ABNORMAL LOW (ref 36.0–46.0)
HCT: 30.3 % — ABNORMAL LOW (ref 36.0–46.0)
Hemoglobin: 8.2 g/dL — ABNORMAL LOW (ref 12.0–15.0)
Hemoglobin: 9.2 g/dL — ABNORMAL LOW (ref 12.0–15.0)
MCH: 24.6 pg — ABNORMAL LOW (ref 26.0–34.0)
MCH: 25.1 pg — ABNORMAL LOW (ref 26.0–34.0)
MCHC: 29.9 g/dL — ABNORMAL LOW (ref 30.0–36.0)
MCHC: 30.4 g/dL (ref 30.0–36.0)
MCV: 82 fL (ref 80.0–100.0)
MCV: 82.6 fL (ref 80.0–100.0)
Platelets: 228 10*3/uL (ref 150–400)
Platelets: 223 10*3/uL (ref 150–400)
RBC: 3.34 MIL/uL — ABNORMAL LOW (ref 3.87–5.11)
RBC: 3.67 MIL/uL — ABNORMAL LOW (ref 3.87–5.11)
RDW: 16.9 % — ABNORMAL HIGH (ref 11.5–15.5)
RDW: 16.7 % — ABNORMAL HIGH (ref 11.5–15.5)
WBC: 7.5 10*3/uL (ref 4.0–10.5)
WBC: 7 10*3/uL (ref 4.0–10.5)
nRBC: 0 % (ref 0.0–0.2)
nRBC: 0 % (ref 0.0–0.2)

## 2020-09-06 LAB — PROTIME-INR
INR: 3.7 — ABNORMAL HIGH (ref 0.8–1.2)
INR: 3.7 — ABNORMAL HIGH (ref 0.8–1.2)
Prothrombin Time: 35.7 seconds — ABNORMAL HIGH (ref 11.4–15.2)
Prothrombin Time: 35.3 seconds — ABNORMAL HIGH (ref 11.4–15.2)

## 2020-09-06 LAB — HEMOGLOBIN AND HEMATOCRIT, BLOOD
HCT: 30.1 % — ABNORMAL LOW (ref 36.0–46.0)
HCT: 30.2 % — ABNORMAL LOW (ref 36.0–46.0)
Hemoglobin: 9 g/dL — ABNORMAL LOW (ref 12.0–15.0)
Hemoglobin: 9.3 g/dL — ABNORMAL LOW (ref 12.0–15.0)

## 2020-09-06 LAB — BASIC METABOLIC PANEL
Anion gap: 6 (ref 5–15)
BUN: 12 mg/dL (ref 8–23)
CO2: 25 mmol/L (ref 22–32)
Calcium: 8.7 mg/dL — ABNORMAL LOW (ref 8.9–10.3)
Chloride: 106 mmol/L (ref 98–111)
Creatinine, Ser: 0.67 mg/dL (ref 0.44–1.00)
GFR, Estimated: >60 mL/min (ref >60)
Glucose, Bld: 108 mg/dL — ABNORMAL HIGH (ref 70–99)
Potassium: 3.9 mmol/L (ref 3.5–5.1)
Sodium: 137 mmol/L (ref 135–145)

## 2020-09-06 LAB — RESP PANEL BY RT-PCR (FLU A&B, COVID) ARPGX2
Influenza A by PCR: NEGATIVE
Influenza B by PCR: NEGATIVE
SARS Coronavirus 2 by RT PCR: NEGATIVE

## 2020-09-06 LAB — TSH: TSH: 5.652 u[IU]/mL — ABNORMAL HIGH (ref 0.350–4.500)

## 2020-09-06 LAB — APTT: aPTT: 62 seconds — ABNORMAL HIGH (ref 24–36)

## 2020-09-06 LAB — ABO/RH: ABO/RH(D): A POS

## 2020-09-06 MED ORDER — MAGNESIUM HYDROXIDE 400 MG/5ML PO SUSP
30.0000 mL | Freq: Every day | ORAL | Status: DC | PRN
Start: 2020-09-06 — End: 2020-09-11

## 2020-09-06 MED ORDER — ONDANSETRON HCL 4 MG PO TABS: 4.0000 mg | ORAL_TABLET | Freq: Four times a day (QID) | ORAL | Status: DC | PRN

## 2020-09-06 MED ORDER — TRAZODONE HCL 50 MG PO TABS
25.0000 mg | ORAL_TABLET | Freq: Every evening | ORAL | Status: DC | PRN
  Administered 2020-09-10: 25 mg via ORAL
  Filled 2020-09-06: qty 1

## 2020-09-06 MED ORDER — ONDANSETRON HCL 4 MG/2ML IJ SOLN: 4.0000 mg | Freq: Four times a day (QID) | INTRAMUSCULAR | Status: DC | PRN

## 2020-09-06 MED ORDER — NORETHINDRONE ACETATE 5 MG PO TABS
5.0000 mg | ORAL_TABLET | Freq: Three times a day (TID) | ORAL | Status: DC
  Administered 2020-09-06 – 2020-09-11 (×16): 5 mg via ORAL
  Filled 2020-09-06 (×21): qty 1

## 2020-09-06 MED ORDER — NYSTATIN 100000 UNIT/GM EX POWD
Freq: Two times a day (BID) | CUTANEOUS | Status: DC
  Filled 2020-09-06 (×2): qty 15

## 2020-09-06 MED ORDER — MORPHINE SULFATE (PF) 2 MG/ML IV SOLN: 2.0000 mg | INTRAVENOUS | Status: DC | PRN

## 2020-09-06 MED ORDER — SODIUM CHLORIDE 0.9 % IV SOLN: INTRAVENOUS | Status: DC

## 2020-09-06 MED ORDER — SODIUM CHLORIDE 0.9 % IV SOLN
10.0000 mL/h | Freq: Once | INTRAVENOUS | Status: AC
  Administered 2020-09-06: 10 mL/h via INTRAVENOUS

## 2020-09-06 MED ORDER — ACETAMINOPHEN 650 MG RE SUPP: 650.0000 mg | Freq: Four times a day (QID) | RECTAL | Status: DC | PRN

## 2020-09-06 MED ORDER — MOMETASONE FURO-FORMOTEROL FUM 100-5 MCG/ACT IN AERO
2.0000 | INHALATION_SPRAY | Freq: Two times a day (BID) | RESPIRATORY_TRACT | Status: DC
  Administered 2020-09-06 – 2020-09-11 (×11): 2 via RESPIRATORY_TRACT
  Filled 2020-09-06 (×2): qty 8.8

## 2020-09-06 MED ORDER — ACETAMINOPHEN 325 MG PO TABS: 650.0000 mg | ORAL_TABLET | Freq: Four times a day (QID) | ORAL | Status: DC | PRN

## 2020-09-06 MED ORDER — LOSARTAN POTASSIUM 25 MG PO TABS
25.0000 mg | ORAL_TABLET | Freq: Every day | ORAL | Status: DC
  Administered 2020-09-06 – 2020-09-11 (×6): 25 mg via ORAL
  Filled 2020-09-06 (×6): qty 1

## 2020-09-06 MED ORDER — ROSUVASTATIN CALCIUM 10 MG PO TABS
10.0000 mg | ORAL_TABLET | Freq: Every day | ORAL | Status: DC
  Administered 2020-09-07 – 2020-09-10 (×4): 10 mg via ORAL
  Filled 2020-09-06 (×6): qty 1

## 2020-09-06 MED ORDER — SODIUM CHLORIDE 0.9% IV SOLUTION
Freq: Once | INTRAVENOUS | Status: DC
  Filled 2020-09-06: qty 250

## 2020-09-06 NOTE — ED Notes (Signed)
Rachael Fee, NP-C notified via secure chat of current hgb 8.2 - awaiting reply

## 2020-09-06 NOTE — ED Notes (Signed)
Pt shown in room at 0026hrs however pt has not arrived to room - report that pt to ultrasound from waiting room and will be transported to room after ultrasound

## 2020-09-06 NOTE — ED Notes (Signed)
Brief changed and pericare provided. 3 very small clots, smaller than tip of eraser, present in brief along with brown dried blood; no current bleeding. Small amount noted in brief before changed. Placed in clean and dry brief at this time.

## 2020-09-06 NOTE — Consult Note (Addendum)
Consult Note  Requesting Attending Physician :  [name], MD Service Requesting Consult : Inpatient hospitalist  Assessment/Recommendations:  Courtney Mack is a 65 y.o. female seen in consultation at the request of [name], MD regarding persistent postmenopausal vaginal bleeding.   Problem list: Principal Problem:   Vaginal bleeding, abnormal Active Problems:   Pulmonary embolism (HCC)   Morbidly obese (HCC)   Acute blood loss anemia   DVT (deep venous thrombosis) (HCC)  Assessment:  1.  Friable necrotic cervix in conjunction with abnormal ultrasound and postmenopausal bleeding likely consistent with either cervical cancer or endometrial cancer extended to the cervix.  2.  Persistent thin watery vaginal bleeding secondary to #1 above.  Well Aygestin 3 times daily may help definitive management using radiation may be necessary.   3.  Likely early involvement with GYN oncology will be necessary.  4.  Medical problems as listed above include morbid obesity, recent history of DVT and saddle embolus.  Plan:  1.  Cervical and endometrial biopsy specimens sent.  Await results.  2.  Continue Aygestin.  HOPI:  Patient has not sought medical care in greater than 10 years.  Her most recent trip to the emergency department in February was for DVT with subsequent saddle embolus.  She was placed on blood thinners at that time.   She states that she has had postmenopausal bleeding for many years but it subsequently became much worse after beginning the blood thinners.  She presented to the emergency department for this visit mainly because of persistent vaginal bleeding. She cannot remember having abnormal Pap smears in the past.  Social History: Social History   Socioeconomic History  . Marital status: Single    Spouse name: Not on file  . Number of children: Not on file  . Years of education: Not on file  . Highest education level: Not on file   Occupational History  . Not on file  Tobacco Use  . Smoking status: Former Research scientist (life sciences)  . Smokeless tobacco: Never Used  Substance and Sexual Activity  . Alcohol use: Not Currently  . Drug use: Not Currently  . Sexual activity: Not on file  Other Topics Concern  . Not on file  Social History Narrative  . Not on file   Social Determinants of Health   Financial Resource Strain: Not on file  Food Insecurity: Not on file  Transportation Needs: Not on file  Physical Activity: Not on file  Stress: Not on file  Social Connections: Not on file    Family History: family history is not on file.   Objective :  Vital signs in last 24 hours: Temp:  [97.5 F (36.4 C)-98.3 F (36.8 C)] 97.8 F (36.6 C) (03/02 1000) Pulse Rate:  [72-100] 77 (03/02 1500) Resp:  [19-29] 25 (03/02 1500) BP: (91-155)/(51-124) 104/59 (03/02 1500) SpO2:  [93 %-100 %] 97 % (03/02 1550) Weight:  [113.4 kg] 113.4 kg (03/01 2048)  Intake/Output last 3 shifts: I/O last 3 completed shifts: In: 17.2 [Blood:17.2] Out: -    Physical Exam:  Morbid obesity and patient's immobility makes vaginal examination almost impossible.  Exam performed with 2 nursing personnel from the emergency department helping to provide exposure.  Speculum examination reveals malodorous friable necrotic appearing cervix.  Simply grasping with an instrument produced a tissue specimen.  An endometrial biopsy was attempted but the cervical os was not well visualized for multiple reasons.  Tissue in the [place] was sent nevertheless.  [place]:  IMPRESSION: 1. Diffusely thickened and heterogeneous appearance of the endometrium is concerning for endometrial carcinoma. Gynecologic follow-up is recommended.   I spent 52 minutes involved in the care of this patient preparing to see the patient by obtaining and reviewing her medical history (including labs, imaging tests and prior procedures), documenting clinical information in the electronic  health record (EHR), counseling and coordinating care plans, writing and sending prescriptions, ordering tests or procedures and directly communicating with the patient by discussing pertinent items from her history and physical exam as well as detailing my assessment and plan as noted above so that she has an informed understanding.  All of her questions were answered.  Finis Bud, M.D. 09/06/2020 4:21 PM

## 2020-09-06 NOTE — ED Notes (Signed)
Patient brief changed. Very small blood clots noted and small amount of brown colored blood.

## 2020-09-06 NOTE — ED Notes (Signed)
recv'd confirmation from blood bank that one unit prbc is ready if/when orders recv'd to transfuse

## 2020-09-06 NOTE — ED Notes (Signed)
Assisted OBGYN MD in pelvic exam and biopsy.

## 2020-09-06 NOTE — ED Notes (Signed)
Late entry -- Pt a&ox4.  Denies pain.  Reports intermittent vaginal spotting/light bleeding for 5-6 years up until day of arrival when bleeding became more profuse with passage of blood clots.  Pt never seen by Gyn regarding abnormal bleeding.  Perineal care completed after pt arrival from ultrasound -- pt wearing adult brief with bloody drainage and small clot noted.  RR even and unlabored on 2L [place] via [place] - pt O2 dependent at baseline.  Denies sob, cp, dizziness, weakness.  Ambulates with walker at baseline.  Abdomen noted to be obese/round but soft, nontender.  BLE nonpitting edema.  Skin warm dry and intact.  Will monitor for acute changes and maintain plan of care as pt awaits ED provider to review ultrasound and repeat lab results

## 2020-09-06 NOTE — H&P (Signed)
PATIENT NAME: Courtney Mack    MR#:  [id number]  DATE OF BIRTH:  Aug 20, 1955  DATE OF ADMISSION:  09/06/2020  PRIMARY CARE PHYSICIAN: Gladstone Lighter, MD   Patient is coming from: Home  REQUESTING/REFERRING PHYSICIAN: Rudene Re, MD  CHIEF COMPLAINT:   Chief Complaint  Patient presents with  . Vaginal Bleeding    HISTORY OF PRESENT ILLNESS:  Courtney Mack is a 65 y.o. Caucasian female with medical history significant for recent left lower extremity DVT and PE status post embolectomy and IVC filter placement, discharged on p.o. Xarelto, hypertension, type II diabetes mellitus and dyslipidemia who presented to the emergency room with acute onset of worsening intermittent vaginal bleeding which has been associated with blood clots.  She denies any abdominal pain.  No nausea vomiting.  No other bleeding diathesis.  No dysuria, oliguria or hematuria or flank pain.  No fever or chills.  No chest pain or palpitations.  She denied any headache or dizziness or blurred vision.  ED Course: When she came to the ER blood pressure was 155/124 and pulse oximetry 100% on 2 L of O2 by nasal cannula with otherwise normal vital signs.  Influenza antigens and COVID-19 PCR came back negative.  CMP was within normal and CBC showed anemia with hemoglobin of 8.2 and hematocrit 27.4 compared to time and 30.1 earlier and 9.5 and 30.9 when she came to the ER.  She was typed and screened.  INR was 3.7 and PTT 35.7  Imaging: Pelvic ultrasound revealed the following: 1. Diffusely thickened and heterogeneous appearance of the endometrium is concerning for endometrial carcinoma. Gynecologic follow-up is recommended. 2. Neither ovary was visualized.  Dr. Amalia Hailey was contacted about the patient and will see her.  He recommended Aygestin therapy and observation.  She will be admitted to a progressive unit bed for further evaluation and management. PAST MEDICAL HISTORY:   Past  Medical History:  Diagnosis Date  . Hx of blood clots   Left lower extremity DVT and PE.  PAST SURGICAL HISTORY:   Past Surgical History:  Procedure Laterality Date  . IVC FILTER INSERTION N/A 08/18/2020   Procedure: IVC FILTER INSERTION;  Surgeon: Katha Cabal, MD;  Location: [facility];  Service: Cardiovascular;  Laterality: N/A;  . PULMONARY THROMBECTOMY N/A 08/18/2020   Procedure: PULMONARY THROMBECTOMY;  Surgeon: Katha Cabal, MD;  Location: [facility];  Service: Cardiovascular;  Laterality: N/A;  . TUBAL LIGATION      SOCIAL HISTORY:   Social History   Tobacco Use  . Smoking status: Former Research scientist (life sciences)  . Smokeless tobacco: Never Used  Substance Use Topics  . Alcohol use: Not Currently    FAMILY HISTORY:  Positive for diabetes mellitus and coronary artery disease.  DRUG ALLERGIES:  No Known Allergies  REVIEW OF SYSTEMS:   ROS As per history of present illness. All pertinent systems were reviewed above. Constitutional, HEENT, cardiovascular, respiratory, GI, GU, musculoskeletal, neuro, psychiatric, endocrine, integumentary and hematologic systems were reviewed and are otherwise negative/unremarkable except for positive findings mentioned above in the HPI.   MEDICATIONS AT HOME:   Prior to Admission medications   Medication Sig Start Date End Date Taking? Authorizing Provider  Fluticasone-Salmeterol (ADVAIR) 100-50 MCG/DOSE AEPB Inhale 1 puff into the lungs 2 (two) times daily. Rinse mouth after each use   Yes [provider]  losartan (COZAAR) 25 MG tablet Take 1 tablet (25 mg total) by mouth daily. 08/25/20  Yes [name], MD  metFORMIN (GLUCOPHAGE) 500 MG tablet Take 1 tablet (500 mg total) by mouth 2 (two) times daily with a meal. 08/24/20  Yes [name], MD  nystatin (MYCOSTATIN/NYSTOP) powder Apply topically 2 (two) times daily. 08/24/20  Yes [name], MD  ondansetron (ZOFRAN) 4 MG tablet Take 1 tablet (4 mg  total) by mouth every 6 (six) hours as needed for nausea. 08/24/20  Yes [name], MD  RIVAROXABAN Alveda Reasons) VTE STARTER PACK (15 & 20 MG) Follow package directions: Take one 15mg  tablet by mouth twice a day. On day 22, switch to one 20mg  tablet once a day. Take with food. 08/24/20  Yes [name], MD  rosuvastatin (CRESTOR) 10 MG tablet Take 1 tablet (10 mg total) by mouth daily. 08/25/20  Yes [name], MD      VITAL SIGNS:  Blood pressure 131/60, pulse 88, temperature 97.9 F (36.6 C), temperature source Oral, resp. rate 20, height 4\' 10"  (1.473 m), weight 113.4 kg, SpO2 98 %.  PHYSICAL EXAMINATION:  Physical Exam  GENERAL:  65 y.o.-year-old Caucasian patient lying in the bed with no acute distress.  EYES: Pupils equal, round, reactive to light and accommodation. No scleral icterus. Extraocular muscles intact.  HEENT: Head atraumatic, normocephalic. Oropharynx and nasopharynx clear.  NECK:  Supple, no jugular venous distention. No thyroid enlargement, no tenderness.  LUNGS: Normal breath sounds bilaterally, no wheezing, rales,rhonchi or crepitation. No use of accessory muscles of respiration.  CARDIOVASCULAR: Regular rate and rhythm, S1, S2 normal. No murmurs, rubs, or gallops.  ABDOMEN: Soft, nondistended, nontender. Bowel sounds present. No organomegaly or mass.  EXTREMITIES: Bilateral nonpitting lower extremity edema with no cyanosis, or clubbing.  NEUROLOGIC: Cranial nerves II through XII are intact. Muscle strength 5/5 in all extremities. Sensation intact. Gait not checked.  PSYCHIATRIC: The patient is alert and oriented x 3.  Normal affect and good eye contact. SKIN: No obvious rash, lesion, or ulcer.   LABORATORY PANEL:   CBC Recent Labs  Lab 09/05/20 2119 09/06/20 0123  WBC 7.4  --   HGB 9.5* 9.0*  HCT 30.9* 30.1*  PLT 277  --     ------------------------------------------------------------------------------------------------------------------  Chemistries  Recent Labs  Lab 09/05/20 2119  NA 134*  K 4.0  CL 102  CO2 26  GLUCOSE 108*  BUN 11  CREATININE 0.69  CALCIUM 9.0  AST 20  ALT 10  ALKPHOS 56  BILITOT 1.4*   ------------------------------------------------------------------------------------------------------------------  Cardiac Enzymes No results for input(s): TROPONINI in the last 168 hours. ------------------------------------------------------------------------------------------------------------------  RADIOLOGY:  US PELVIC COMPLETE WITH TRANSVAGINAL  Result Date: 09/06/2020 CLINICAL DATA:  Postmenopausal bleeding EXAM: TRANSABDOMINAL AND TRANSVAGINAL ULTRASOUND OF PELVIS TECHNIQUE: Both transabdominal and transvaginal ultrasound examinations of the pelvis were performed. Transabdominal technique was performed for global imaging of the pelvis including uterus, ovaries, adnexal regions, and pelvic cul-de-sac. It was necessary to proceed with endovaginal exam following the transabdominal exam to visualize the ovaries. COMPARISON:  None FINDINGS: Uterus Measurements: 10.4 x 5.2 x 5.9 cm = volume: 166 mL. No fibroids or other mass visualized. Endometrium Thickness: 29. The endometrium is diffusely heterogeneous and thickened with increased vascularity. Right ovary Not visualized Left ovary Not visualized Other findings No abnormal free fluid. IMPRESSION: 1. Diffusely thickened and heterogeneous appearance of the endometrium is concerning for endometrial carcinoma. Gynecologic follow-up is recommended. 2. Neither ovary was visualized. Electronically Signed   By: Constance Holster M.D.   On: 09/06/2020 01:28  IMPRESSION AND PLAN:  Active Problems:   Acute blood loss anemia  1.  Vaginal bleeding possibly secondary to endometrial carcinoma with subsequent acute blood loss anemia. -The patient  will be admitted to a progressive unit bed. -We will follow serial hemoglobins and hematocrits. -The patient was typed and crossmatched. -Xarelto will obviously be held off.  The patient is status post suction embolectomy, thrombolysis and IVC filter placement for acute PE with right ventricular strain. -GYN consultation will be obtained. -I notified Dr. Amalia Hailey about the patient as the patient will need endometrial biopsy. -We will continue hydration with IV normal saline. -We will continue her on Aygestin 5 mg p.o. 3 times daily per Dr. Amalia Hailey recommendation.  2.  Type II diabetes mellitus. -The patient will be placed on supplement coverage with NovoLog. -We will hold off Metformin.  3.  Essential hypertension. -We will continue Cozaar.  4.  Dyslipidemia. -We will continue statin therapy.  5.  Recent acute PE with left lower extremity DVT status post IVC filter placement. -She will have SCDs. -INR is currently supratherapeutic.  DVT prophylaxis: SCDs.   Code Status: full code.   Family Communication:  The plan of care was discussed in details with the patient (and family). I answered all questions. The patient agreed to proceed with the above mentioned plan. Further management will depend upon hospital course. Disposition Plan: Back to previous home environment Consults called: Mechele Claude consultation to Dr. Amalia Hailey. All the records are reviewed and case discussed with ED provider.  Status is: Inpatient  Remains inpatient appropriate because:Ongoing diagnostic testing needed not appropriate for outpatient work up, Unsafe d/c plan, IV treatments appropriate due to intensity of illness or inability to take PO and Inpatient level of care appropriate due to severity of illness   Dispo: The patient is from: Home              Anticipated d/c is to: Home              Patient currently is not medically stable to d/c.   Difficult to place patient No   TOTAL TIME TAKING CARE OF THIS PATIENT:  55 minutes.    Christel Mormon M.D on 09/06/2020 at 4:01 AM  [facility]   From 7 PM-7 AM, contact night-coverage [email or web address]  CC: Primary care physician; Gladstone Lighter, MD

## 2020-09-06 NOTE — Hospital Course (Addendum)
10yow PMH recent DVT, PE s/p embolectomy and IVC filter placement discharged on Xarelto presented to ED w/ vaginal bleeding. U/s worrisome for endometrial carcinoma. Admitted for bleeding on anticoagulation, GYN eval. seen by GYN, had friable cervix on exam, could not definitely access the endometrium.  Concern for cervical or endometrial carcinoma.  Specimen sent, pathology pending.  Hemoglobin improved status post transfusion, continues to have some blood, appears to be minor.  No significant blood loss identified at this point.  Hemoglobin remains stable.  Continue to monitor.  A & P  Vaginal bleeding acute on chronic intermittent exacerbated by Xarelto w/ u/s concerning for endometrial carcinoma w/ associated acute blood loss anemia --Very minimal bleeding in urine purwick --Hgb remains stable, will continue Xarelto and follow CBC --continue Aygestin indefinitely per GYN --await pathology, will refer to see GYN oncology next week  PMH Massive bilateral PE s/p embolectomy and thrombolysis and placement IVC, bilateral LE DVT 08/24/20 --continue Xarelto given recent PE, DVT and use now of Aygestin.  DM type 2, A1c 10.1 2/13 --CBG remains stable, will hold metformin as inpt --will continue SSI  Essential HTN --stable, continue Losartan  Morbid obesity Body mass index is 52.25 kg/m. --as per dietician

## 2020-09-06 NOTE — ED Provider Notes (Signed)
[facility] Emergency Department Provider Note  ____________________________________________  Time seen: Approximately 2:02 AM  I have reviewed the triage vital signs and the nursing notes.   HISTORY  Chief Complaint Vaginal Bleeding   HPI Courtney Mack is a 65 y.o. female who presents for evaluation of vaginal bleeding. Patient reports intermittent vaginal bleeding for the last 2 years.  Had not seen a doctor for 10 years until presenting to the ED on 08/18/20 and found to have saddle PE requiring suction embolectomy and thrombolysis by vascular surgery.  During that admission patient was started on Xarelto.  She reports that the vaginal bleeding has become more severe and constant over the last 2 weeks.  Today she started passing large clots.  She denies dizziness, chest pain, shortness of breath, abdominal pain.  She denies any family history of GYN cancer.  Past Medical History:  Diagnosis Date  . Hx of blood clots     Patient Active Problem List   Diagnosis Date Noted  . Anemia, unspecified 08/24/2020  . Morbidly obese ([place]) 08/23/2020  . Essential hypertension 08/23/2020  . New onset type 2 diabetes mellitus ([place]) 08/23/2020  . Dyslipidemia 08/23/2020  . Acute respiratory failure with hypoxia ([place])   . Pulmonary embolism ([place]) 08/18/2020  . Vaginal bleeding, abnormal 08/18/2020  . Obesity, morbid, BMI 50 or higher ([place]) 08/18/2020    Past Surgical History:  Procedure Laterality Date  . IVC FILTER INSERTION N/A 08/18/2020   Procedure: IVC FILTER INSERTION;  Surgeon: Katha Cabal, MD;  Location: [facility];  Service: Cardiovascular;  Laterality: N/A;  . PULMONARY THROMBECTOMY N/A 08/18/2020   Procedure: PULMONARY THROMBECTOMY;  Surgeon: Katha Cabal, MD;  Location: [facility];  Service: Cardiovascular;  Laterality: N/A;  . TUBAL LIGATION      Prior to Admission medications   Medication Sig Start Date End  Date Taking? Authorizing Provider  losartan (COZAAR) 25 MG tablet Take 1 tablet (25 mg total) by mouth daily. 08/25/20   [name], MD  metFORMIN (GLUCOPHAGE) 500 MG tablet Take 1 tablet (500 mg total) by mouth 2 (two) times daily with a meal. 08/24/20   [name], MD  nystatin (MYCOSTATIN/NYSTOP) powder Apply topically 2 (two) times daily. 08/24/20   [name], MD  ondansetron (ZOFRAN) 4 MG tablet Take 1 tablet (4 mg total) by mouth every 6 (six) hours as needed for nausea. 08/24/20   [name], MD  RIVAROXABAN Alveda Reasons) VTE STARTER PACK (15 & 20 MG) Follow package directions: Take one 15mg  tablet by mouth twice a day. On day 22, switch to one 20mg  tablet once a day. Take with food. 08/24/20   [name], MD  rosuvastatin (CRESTOR) 10 MG tablet Take 1 tablet (10 mg total) by mouth daily. 08/25/20   [name], MD    Allergies Patient has no known allergies.  History reviewed. No pertinent family history.  Social History Social History   Tobacco Use  . Smoking status: Former Research scientist (life sciences)  . Smokeless tobacco: Never Used  Substance Use Topics  . Alcohol use: Not Currently  . Drug use: Not Currently    Review of Systems  Constitutional: Negative for fever. Eyes: Negative for visual changes. ENT: Negative for sore throat. Neck: No neck pain  Cardiovascular: Negative for chest pain. Respiratory: Negative for shortness of breath. Gastrointestinal: Negative for abdominal pain, vomiting or diarrhea. Genitourinary: Negative for dysuria. + vaginal bleeding Musculoskeletal: Negative for  back pain. Skin: Negative for rash. Neurological: Negative for headaches, weakness or numbness. Psych: No SI or HI  ____________________________________________   PHYSICAL EXAM:  VITAL SIGNS: ED Triage Vitals  Enc Vitals Group     BP 09/05/20 2048 (!) 155/124     Pulse Rate 09/05/20 2048 88     Resp 09/05/20 2048 20     Temp 09/05/20 2048 98.3 F (36.8 C)     Temp  Source 09/05/20 2048 Oral     SpO2 09/05/20 2048 100 %     Weight 09/05/20 2048 250 lb (113.4 kg)     Height 09/05/20 2048 4\' 10"  (1.473 m)     Head Circumference --      Peak Flow --      Pain Score 09/05/20 2116 0     Pain Loc --      Pain Edu? --      Excl. in [place]? --     Constitutional: Alert and oriented. Well appearing and in no apparent distress. HEENT:      Head: Normocephalic and atraumatic.         Eyes: Conjunctivae are normal. Sclera is non-icteric.       Mouth/Throat: Mucous membranes are moist.       Neck: Supple with no signs of meningismus. Cardiovascular: Regular rate and rhythm. No murmurs, gallops, or rubs. 2+ symmetrical distal pulses are present in all extremities. No JVD. Respiratory: Normal respiratory effort. Lungs are clear to auscultation bilaterally.  Gastrointestinal: Soft, non tender, and non distended with positive bowel sounds. No rebound or guarding. Musculoskeletal:  No edema, cyanosis, or erythema of extremities. Neurologic: Normal speech and language. Face is symmetric. Moving all extremities. No gross focal neurologic deficits are appreciated. Skin: Skin is warm, dry and intact. No rash noted. Psychiatric: Mood and affect are normal. Speech and behavior are normal.  ____________________________________________   LABS (all labs ordered are listed, but only abnormal results are displayed)  Labs Reviewed  CBC - Abnormal; Notable for the following components:      Result Value   RBC 3.84 (*)    Hemoglobin 9.5 (*)    HCT 30.9 (*)    MCH 24.7 (*)    RDW 17.0 (*)    All other components within normal limits  COMPREHENSIVE METABOLIC PANEL - Abnormal; Notable for the following components:   Sodium 134 (*)    Glucose, Bld 108 (*)    Total Bilirubin 1.4 (*)    All other components within normal limits  HEMOGLOBIN AND HEMATOCRIT, BLOOD - Abnormal; Notable for the following components:   Hemoglobin 9.0 (*)    HCT 30.1 (*)    All other components  within normal limits  PROTIME-INR - Abnormal; Notable for the following components:   Prothrombin Time 35.7 (*)    INR 3.7 (*)    All other components within normal limits  APTT - Abnormal; Notable for the following components:   aPTT 62 (*)    All other components within normal limits  RESP PANEL BY RT-PCR (FLU A&B, COVID) ARPGX2  TYPE AND SCREEN  PREPARE RBC (CROSSMATCH)   ____________________________________________  EKG  none  ____________________________________________  RADIOLOGY  I have personally reviewed the images performed during this visit and I agree with the Radiologist's read.   Interpretation by Radiologist:  US PELVIC COMPLETE WITH TRANSVAGINAL  Result Date: 09/06/2020 CLINICAL DATA:  Postmenopausal bleeding EXAM: TRANSABDOMINAL AND TRANSVAGINAL ULTRASOUND OF PELVIS TECHNIQUE: Both transabdominal and transvaginal ultrasound examinations of the  pelvis were performed. Transabdominal technique was performed for global imaging of the pelvis including uterus, ovaries, adnexal regions, and pelvic cul-de-sac. It was necessary to proceed with endovaginal exam following the transabdominal exam to visualize the ovaries. COMPARISON:  None FINDINGS: Uterus Measurements: 10.4 x 5.2 x 5.9 cm = volume: 166 mL. No fibroids or other mass visualized. Endometrium Thickness: 29. The endometrium is diffusely heterogeneous and thickened with increased vascularity. Right ovary Not visualized Left ovary Not visualized Other findings No abnormal free fluid. IMPRESSION: 1. Diffusely thickened and heterogeneous appearance of the endometrium is concerning for endometrial carcinoma. Gynecologic follow-up is recommended. 2. Neither ovary was visualized. Electronically Signed   By: Constance Holster M.D.   On: 09/06/2020 01:28     ____________________________________________   PROCEDURES  Procedure(s) performed:yes .1-3 Lead EKG Interpretation Performed by: Rudene Re, MD Authorized  by: Rudene Re, MD     Interpretation: non-specific     ECG rate assessment: normal     Rhythm: sinus rhythm     Ectopy: none     Critical Care performed:  None ____________________________________________   INITIAL IMPRESSION / ASSESSMENT AND PLAN / ED COURSE  65 y.o. female who presents for evaluation of vaginal bleeding. Patient reports intermittent vaginal bleeding for the last 2 years.  Had not seen a doctor for 10 years until presenting to the ED on 08/18/20 and found to have saddle PE requiring suction embolectomy and thrombolysis by vascular surgery.  During that admission patient was started on Xarelto.  She reports that the vaginal bleeding has become more severe and constant over the last 2 weeks.  HD stable. Hgb trending down from 12.3 to 9.5 on arrival to ED today and now at 9.0 after 5 hours in the ED. Elevated INR 3.7 and PTT 62. On xarelto. [place] concerning for endometrial cancer which is most likely the cause of b/l PEs and DVTs. Discussed with Dr. Amalia Hailey from ObGYN who recommended PO progesterone to stop the bleeding. Will admit to hospitalist for trending H&H, type and cross active. Patient will need endometrial biopsy and Gyn will follow in house for further workup. Old medical records reviewed including all the notes from recent admission.  Findings and recommendations discussed with patient who is in agreement.  Will consult the hospitalist service.  Patient placed on telemetry for close monitoring.      _____________________________________________ Please note:  Patient was evaluated in Emergency Department today for the symptoms described in the history of present illness. Patient was evaluated in the context of the global COVID-19 pandemic, which necessitated consideration that the patient might be at risk for infection with the SARS-CoV-2 virus that causes COVID-19. Institutional protocols and algorithms that pertain to the evaluation of patients at risk for  COVID-19 are in a state of rapid change based on information released by regulatory bodies including the CDC and federal and state organizations. These policies and algorithms were followed during the patient's care in the ED.  Some ED evaluations and interventions may be delayed as a result of limited staffing during the pandemic.   [place] Controlled Substance Database was reviewed by me. ____________________________________________   FINAL CLINICAL IMPRESSION(S) / ED DIAGNOSES   Final diagnoses:  Vaginal bleeding  Acute blood loss anemia      NEW MEDICATIONS STARTED DURING THIS VISIT:  ED Discharge Orders    None       Note:  This document was prepared using Dragon voice recognition software and may include unintentional dictation errors.  Alfred Levins, [place], MD 09/06/20 [phone]

## 2020-09-06 NOTE — ED Notes (Signed)
Report to Lindsay, RN.

## 2020-09-06 NOTE — Progress Notes (Addendum)
PROGRESS NOTE  Courtney Mack:[id number] DOB: 1956/05/23 DOA: 09/06/2020 PCP: Gladstone Lighter, MD  Brief History   61yow PMH recent DVT, PE s/p embolectomy and IVC filter placement discharged on Xarelto presented to ED w/ vaginal bleeding. U/s worrisome for endometrial carcinoma. Admitted for bleeding on anticoagulation, GYN eval.  A & P  Vaginal bleeding acute on chronic intermittent exacerbated by Xarelto w/ u/s concerning for endometrial carcinoma w/ associated acute blood loss anemia --Hgb slow trend down but hemodynamics stable, no shock --finishing 1unit PRBC, will trend Hgb  --continue Aygestin as per GYN, await GYN evaluation, keep NPO for now  PMH Massive bilateral PE s/p embolectomy and thrombolysis and placement IVC, bilateral LE DVT 08/24/20 --Xarelto on hold until bleeding controlled, then will resume --outpatient f/u with PCP recommended  DM type 2, A1c 10.1 2/13 --hold metformin is inpt --SSI  Essential HTN --stable, Losartan  Morbid obesity Body mass index is 52.25 kg/m. --as per dietician   Nutritional Assessment: Body mass index is 52.25 kg/m.Marland Kitchen Seen by dietician.  I agree with the assessment and plan as outlined below: Nutrition Status:        Disposition Plan:  Discussion: trend CBC, f/u GYN recs  Status is: Inpatient  Remains inpatient appropriate because:Inpatient level of care appropriate due to severity of illness   Dispo: The patient is from: Home              Anticipated d/c is to: Home              Patient currently is not medically stable to d/c.   Difficult to place patient No  DVT prophylaxis: SCDs Start: 09/06/20 4268   Code Status: Full Code Level of care: Med-Surg Family Communication: none present  Murray Hodgkins, MD  [facility] Direct contact: see [email or web address] (further directions at bottom of note if needed) 7PM-7AM contact night coverage as at bottom of note 09/06/2020, 10:00 AM  LOS: 0 days   Significant  Hospital Events   .    Consults:   GYN   Procedures:  . 1 unit PRBC 3/2  Significant Diagnostic Tests:  Marland Kitchen    Micro Data:  .    Antimicrobials:  .   Interval History/Subjective  CC: f/u bleeding  Has had intermittent bleeding in the past, but always self-limited  Some bleeding last night w/ clots and some on pad today.  No CP or SOB, legs w/ less edema compared to past.  No pain  Objective   Vitals:  Vitals:   09/06/20 0830 09/06/20 0915  BP: 135/60 127/67  Pulse: 88   Resp: 20 (!) 29  Temp:    SpO2: 97%     Exam:  Constitutional:   . Appears calm and comfortable in ED Eyes:  . pupils and irises appear normal ENMT:  . grossly normal hearing  Respiratory:  . CTA bilaterally, no w/r/r.  . Respiratory effort normal. Cardiovascular:  . RRR, no m/r/g . No LE extremity edema   Psychiatric:  . Mental status o Mood, affect appropriate  I have personally reviewed the following:   Today's Data  . TSH 5.652 . Hgb 9.5 > 9 > 8.2 . BMP WNL . Transvaginal u/s noted  Scheduled Meds: . sodium chloride   Intravenous Once  . losartan  25 mg Oral Daily  . mometasone-formoterol  2 puff Inhalation BID  . norethindrone  5 mg Oral TID  . nystatin   Topical BID  . rosuvastatin  10 mg Oral  q1800   Continuous Infusions: . sodium chloride Stopped (09/06/20 0914)    Principal Problem:   Vaginal bleeding, abnormal Active Problems:   Pulmonary embolism (HCC)   Morbidly obese (HCC)   Acute blood loss anemia   DVT (deep venous thrombosis) ([place])   LOS: 0 days   How to contact the [facility] Attending or Consulting provider [place] or covering provider during after hours [place], for this patient?  1. Check the care team in [facility] and look for a) attending/consulting TRH provider listed and b) the [facility] team listed 2. Log into [email or web address] and use [place]'s universal password to access. If you do not have the password, please contact the hospital operator. 3. Locate  the [facility] provider you are looking for under [facility] and page to a number that you can be directly reached. 4. If you still have difficulty reaching the provider, please page the [facility] (Director on Call) for the Hospitalists listed on amion for assistance.

## 2020-09-06 NOTE — ED Notes (Signed)
Pt reporting being cold-- provided 2 warm blankets and thermostat temp increased at this time; no other needs, concerns verbalized at this time

## 2020-09-07 DIAGNOSIS — D62 Acute posthemorrhagic anemia: Secondary | ICD-10-CM | POA: Diagnosis not present

## 2020-09-07 DIAGNOSIS — I2602 Saddle embolus of pulmonary artery with acute cor pulmonale: Secondary | ICD-10-CM | POA: Diagnosis not present

## 2020-09-07 DIAGNOSIS — N939 Abnormal uterine and vaginal bleeding, unspecified: Secondary | ICD-10-CM | POA: Diagnosis not present

## 2020-09-07 DIAGNOSIS — I2782 Chronic pulmonary embolism: Secondary | ICD-10-CM | POA: Diagnosis not present

## 2020-09-07 LAB — BASIC METABOLIC PANEL
Anion gap: 6 (ref 5–15)
BUN: 12 mg/dL (ref 8–23)
CO2: 25 mmol/L (ref 22–32)
Calcium: 8.5 mg/dL — ABNORMAL LOW (ref 8.9–10.3)
Chloride: 106 mmol/L (ref 98–111)
Creatinine, Ser: 0.6 mg/dL (ref 0.44–1.00)
GFR, Estimated: >60 mL/min (ref >60)
Glucose, Bld: 106 mg/dL — ABNORMAL HIGH (ref 70–99)
Potassium: 4 mmol/L (ref 3.5–5.1)
Sodium: 137 mmol/L (ref 135–145)

## 2020-09-07 LAB — CBC
HCT: 29.7 % — ABNORMAL LOW (ref 36.0–46.0)
HCT: 28.5 % — ABNORMAL LOW (ref 36.0–46.0)
Hemoglobin: 9.2 g/dL — ABNORMAL LOW (ref 12.0–15.0)
Hemoglobin: 9 g/dL — ABNORMAL LOW (ref 12.0–15.0)
MCH: 25.5 pg — ABNORMAL LOW (ref 26.0–34.0)
MCH: 25.6 pg — ABNORMAL LOW (ref 26.0–34.0)
MCHC: 31 g/dL (ref 30.0–36.0)
MCHC: 31.6 g/dL (ref 30.0–36.0)
MCV: 82.3 fL (ref 80.0–100.0)
MCV: 81.2 fL (ref 80.0–100.0)
Platelets: 222 10*3/uL (ref 150–400)
Platelets: 242 10*3/uL (ref 150–400)
RBC: 3.61 MIL/uL — ABNORMAL LOW (ref 3.87–5.11)
RBC: 3.51 MIL/uL — ABNORMAL LOW (ref 3.87–5.11)
RDW: 17 % — ABNORMAL HIGH (ref 11.5–15.5)
RDW: 17 % — ABNORMAL HIGH (ref 11.5–15.5)
WBC: 8.1 10*3/uL (ref 4.0–10.5)
WBC: 8.1 10*3/uL (ref 4.0–10.5)
nRBC: 0 % (ref 0.0–0.2)
nRBC: 0 % (ref 0.0–0.2)

## 2020-09-07 LAB — PROTIME-INR
INR: 1.5 — ABNORMAL HIGH (ref 0.8–1.2)
Prothrombin Time: 17.2 seconds — ABNORMAL HIGH (ref 11.4–15.2)

## 2020-09-07 MED ORDER — ADULT MULTIVITAMIN W/MINERALS CH
1.0000 | ORAL_TABLET | Freq: Every day | ORAL | Status: DC
  Administered 2020-09-08 – 2020-09-11 (×4): 1 via ORAL
  Filled 2020-09-07 (×4): qty 1

## 2020-09-07 MED ORDER — RIVAROXABAN 15 MG PO TABS
15.0000 mg | ORAL_TABLET | Freq: Two times a day (BID) | ORAL | Status: AC
  Administered 2020-09-07 – 2020-09-09 (×6): 15 mg via ORAL
  Filled 2020-09-07 (×8): qty 1

## 2020-09-07 MED ORDER — RIVAROXABAN 20 MG PO TABS
20.0000 mg | ORAL_TABLET | Freq: Every day | ORAL | Status: DC
  Administered 2020-09-10: 20 mg via ORAL
  Filled 2020-09-07 (×2): qty 1

## 2020-09-07 MED ORDER — ENSURE MAX PROTEIN PO LIQD
11.0000 [oz_av] | Freq: Two times a day (BID) | ORAL | Status: DC
  Administered 2020-09-07 – 2020-09-10 (×6): 11 [oz_av] via ORAL
  Filled 2020-09-07: qty 330

## 2020-09-07 MED ORDER — ACETAMINOPHEN 325 MG PO TABS: 650.0000 mg | ORAL_TABLET | Freq: Four times a day (QID) | ORAL | Status: DC | PRN

## 2020-09-07 MED ORDER — ACETAMINOPHEN 650 MG RE SUPP: 650.0000 mg | Freq: Four times a day (QID) | RECTAL | Status: DC | PRN

## 2020-09-07 NOTE — Evaluation (Signed)
Occupational Therapy Evaluation Patient Details Name: Courtney Mack MRN: [id number] DOB: 09-Aug-1955 Today's Date: 09/07/2020    History of Present Illness 67yow PMH recent DVT, PE s/p embolectomy and IVC filter placement discharged on Xarelto presented to ED w/ vaginal bleeding. U/s worrisome for endometrial carcinoma. Admitted for bleeding on anticoagulation, GYN eval. On 2L supplemental O2 now.   Clinical Impression   Pt was seen for OT evaluation this date. Prior to hospital admission, pt was at short term rehab working to improve strength and mobility from previous hospitalization ~ 2 weeks ago. Pt reports she was getting ready to discharge back home when she began to experience vaginal bleeding and was brought back to the hospital. Prior to recent hospitalization and STR, pt was living with her daughter and 5 grandchildren. Pt reports between them, there was always someone home. Pt endorses requiring assist for bed mobility at baseline from family but was able to use lift recliner to get up and down during the day. Pt ambulates with a rollator at baseline and typically able to shower and toilet with modified independence, requiring some assist for socks and shoes as well as meal prep, cleaning, and driving (has R eye cataract that she plans to have surgically addressed before going back to driving). Pt pleasant and agreeable to therapy. PT present for co-tx. Currently pt demonstrates impairments as described below (See OT problem list) which functionally limit her ability to perform ADL/self-care tasks. Pt currently requires MIN A for LB ADL, MIN A for pericare in standing, S-CGA for standing ADL tasks with UE support on the RW. Pt desats to high 80's on 2L O2 with exertion, requiring cues for PLB after initial instruction to support breath recovery with SpO2 improving to low 90's after rest and PLB. Purewick found with large blood clot attached to it. RN notified. Pt able to perform hygiene with  set up. New PureWick placed at end of session. Pt would benefit from skilled OT services to address noted impairments and functional limitations (see below for any additional details) in order to maximize safety and independence while minimizing falls risk and caregiver burden. Upon hospital discharge, recommend STR to maximize pt safety and return to PLOF with goal of return to home afterwards.     Follow Up Recommendations  SNF    Equipment Recommendations  3 in 1 bedside commode;Other (comment) (may benefit from toileting aide)    Recommendations for Other Services       Precautions / Restrictions Precautions Precautions: Fall Precaution Comments: monitor O2 sats Restrictions Weight Bearing Restrictions: No Other Position/Activity Restrictions: use briefs/pad for OOB/mobility 2/2 bladder incontinence and vaginal bleeding      Mobility Bed Mobility Overal bed mobility: Needs Assistance Bed Mobility: Supine to Sit;Sit to Supine     Supine to sit: Min assist;HOB elevated Sit to supine: Mod assist   General bed mobility comments: assist for BLE mgt back to bed; heavy use of bed rails    Transfers Overall transfer level: Needs assistance Equipment used: Rolling walker (2 wheeled) Transfers: Sit to/from Stand Sit to Stand: From elevated surface;Min assist (cues for hand placement; +2 for safety but not needed)              Balance Overall balance assessment: Needs assistance Sitting-balance support: No upper extremity supported;Feet supported Sitting balance-Leahy Scale: Good     Standing balance support: Single extremity supported;During functional activity Standing balance-Leahy Scale: Fair Standing balance comment: pt able to use RUE to perform pericare  in standing with LUE support on RW, no LOB noted                           ADL either performed or assessed with clinical judgement   ADL Overall ADL's : Needs assistance/impaired                                        General ADL Comments: CGA for ADL transfers with RW, Sup-CGA in standing for hygiene and MIN A for pericare, Min-MOD A for LB ADL     Vision Baseline Vision/History: Wears glasses;Cataracts (R eye cataract, scheduled for surgery soon) Wears Glasses: At all times Patient Visual Report: No change from baseline Vision Assessment?: No apparent visual deficits     Perception     Praxis      Pertinent Vitals/Pain Pain Assessment: No/denies pain     Hand Dominance Right   Extremity/Trunk Assessment Upper Extremity Assessment Upper Extremity Assessment: Overall WFL for tasks assessed   Lower Extremity Assessment Lower Extremity Assessment: Generalized weakness;Overall [facility] for tasks assessed (difficulty with leg lifts, grossly at least 4-/5 throughout)   Cervical / Trunk Assessment Cervical / Trunk Assessment: Normal   Communication Communication Communication: No difficulties   Cognition Arousal/Alertness: Awake/alert Behavior During Therapy: WFL for tasks assessed/performed Overall Cognitive Status: Within Functional Limits for tasks assessed                                     General Comments  very large blood clot on it (about the size of a walnut) attached to PureWick when removed, RN notified; on 2L, pt desats to high 80's with exertion, bouncing back quickly to low 90's with PLB    Exercises Other Exercises Other Exercises: bed mobility, STS ADL transfers, pericare in standing, edu in PLB to support breath recovery with activity   Shoulder Instructions      Home Living Family/patient expects to be discharged to:: Skilled nursing facility Living Arrangements: Children Available Help at Discharge: Family;Available PRN/intermittently;Available 24 hours/day Type of Home: House Home Access: Ramped entrance     Home Layout: One level     Bathroom Shower/Tub: Occupational psychologist: Handicapped height      Home Equipment: Environmental consultant - 4 wheels;Cane - single point;Other (comment)   Additional Comments: has lift recliner      Prior Functioning/Environment Level of Independence: Needs assistance  Gait / Transfers Assistance Needed: ambulated with rollator prior to recent admission household distances; assist for transfers in/out of bed, has lift recliner that she relies on; since at rehab she reports walking up to 160' w/ RW and therapy ADL's / Homemaking Assistance Needed: Mod indep with toileting, bathing; requires assist for socks/shoes, meal prep, cleaning, and driving (can drive but waiting for R cataract surgery to be done before driving again)   Comments: Denies falls        OT Problem List: Decreased strength;Decreased activity tolerance;Impaired balance (sitting and/or standing);Cardiopulmonary status limiting activity;Decreased knowledge of use of DME or AE;Obesity      OT Treatment/Interventions: Self-care/ADL training;Therapeutic exercise;Therapeutic activities;DME and/or AE instruction;Patient/family education;Balance training;Energy conservation    OT Goals(Current goals can be found in the care plan section) Acute Rehab OT Goals Patient Stated Goal: go back to rehab to get  stronger then go back home OT Goal Formulation: With patient Time For Goal Achievement: 09/21/20 Potential to Achieve Goals: Good ADL Goals Pt Will Perform Lower Body Dressing: with modified independence;sit to/from stand;with adaptive equipment Pt Will Transfer to Toilet: with modified independence;ambulating;bedside commode (LRAD) Pt Will Perform Toileting - Clothing Manipulation and hygiene: with modified independence;sit to/from stand;with adaptive equipment Additional ADL Goal #1: Pt will demo use of learned ECS including pursed lip breathing to minimize SOB and over exertion during ADL tasks, requiring no VC to utilize.  OT Frequency: Min 1X/week   Barriers to D/C:            Co-evaluation  PT/OT/SLP Co-Evaluation/Treatment: Yes Reason for Co-Treatment: For patient/therapist safety;To address functional/ADL transfers PT goals addressed during session: Mobility/safety with mobility;Balance;Proper use of DME OT goals addressed during session: ADL's and self-care;Proper use of Adaptive equipment and DME      AM-PAC OT "6 Clicks" Daily Activity     Outcome Measure Help from another person eating meals?: None Help from another person taking care of personal grooming?: A Little Help from another person toileting, which includes using toliet, bedpan, or urinal?: A Little Help from another person bathing (including washing, rinsing, drying)?: A Lot Help from another person to put on and taking off regular upper body clothing?: None Help from another person to put on and taking off regular lower body clothing?: A Lot 6 Click Score: 18   End of Session Equipment Utilized During Treatment: Gait belt;Rolling walker;Oxygen Nurse Communication: Mobility status;Other (comment) (blood clots)  Activity Tolerance: Patient tolerated treatment well Patient left: in bed;with call bell/phone within reach;with bed alarm set;Other (comment) (purewick replaced, nasal cannula in place, 2L O2)  OT Visit Diagnosis: Other abnormalities of gait and mobility (R26.89);Muscle weakness (generalized) (M62.81)                Time: 3614-4315 OT Time Calculation (min): 26 min Charges:  OT General Charges $OT Visit: 1 Visit OT Evaluation $OT Eval Moderate Complexity: 1 Mod OT Treatments $Self Care/Home Management : 8-22 mins  Hanley Hays, MPH, MS, OTR/L ascom [phone] 09/07/20, 3:19 PM

## 2020-09-07 NOTE — TOC Initial Note (Signed)
Transition of Care [facility]) - Initial/Assessment Note    Patient Details  Name: Courtney Mack MRN: [id number] Date of Birth: 07-11-1955  [facility] CM/SW Contact:    Pete Pelt, RN Phone Number: 09/07/2020, 9:46 AM  Clinical Narrative: TOC met with patient at bedside, introductions made, purpose of visit stated to be discharge planning.  Pt amenable to discussion.  Patient transferred from [facility].  Magda Paganini from [facility] confirmed return upon discharge.  Patient amenable to returning to [facility] post discharge.  No confirmed discharge date at this time.  TOC relayed contact information for any questions/concerns, patient had no questions at this time.  TOC will follow patient through discharge.                Expected Discharge Plan: Skilled Nursing Facility Barriers to Discharge: Continued Medical Work up   Patient Goals and CMS Choice        Expected Discharge Plan and Services Expected Discharge Plan: [place] Acute Care Choice: [place] Living arrangements for the past 2 months: [place]                                      Prior Living Arrangements/Services Living arrangements for the past 2 months: [place] Lives with:: [facility] Patient language and need for interpreter reviewed:: Yes Do you feel safe going back to the place where you live?: Yes      Need for Family Participation in Patient Care: Yes (Comment) Care giver support system in place?: Yes (comment)   Criminal Activity/Legal Involvement Pertinent to Current Situation/Hospitalization: No - Comment as needed  Activities of Daily Living Home Assistive Devices/Equipment: Engineer, drilling (specify type) ADL Screening (condition at time of admission) Patient's cognitive ability adequate to safely complete daily activities?:  Yes Is the patient deaf or have difficulty hearing?: No Does the patient have difficulty seeing, even when wearing glasses/contacts?: No Does the patient have difficulty concentrating, remembering, or making decisions?: No Patient able to express need for assistance with ADLs?: Yes Does the patient have difficulty dressing or bathing?: Yes Independently performs ADLs?: No Communication: Independent Dressing (OT): Needs assistance Is this a change from baseline?: Pre-admission baseline Grooming: Needs assistance Is this a change from baseline?: Pre-admission baseline Feeding: Independent Bathing: Needs assistance Is this a change from baseline?: Pre-admission baseline Toileting: Dependent Is this a change from baseline?: Pre-admission baseline In/Out Bed: Dependent Is this a change from baseline?: Pre-admission baseline Walks in Home: Needs assistance Is this a change from baseline?: Pre-admission baseline Does the patient have difficulty walking or climbing stairs?: Yes Weakness of Legs: Both Weakness of Arms/Hands: Both  Permission Sought/Granted Permission sought to share information with : Case Manager Permission granted to share information with : Yes, Verbal Permission Granted              Emotional Assessment Appearance:: Appears stated age Attitude/Demeanor/Rapport: Engaged,Gracious Affect (typically observed): Calm,Quiet,Pleasant Orientation: : Oriented to Self,Oriented to Place,Oriented to  Time,Oriented to Situation Alcohol / Substance Use: Not Applicable Psych Involvement: No (comment)  Admission diagnosis:  Acute blood loss anemia [D62] Vaginal bleeding [N93.9] Patient Active Problem List   Diagnosis Date Noted  . Acute blood loss anemia 09/06/2020  . Vaginal bleeding 09/06/2020  . DVT (deep venous thrombosis) ([place]) 09/06/2020  . Anemia, unspecified 08/24/2020  .  Morbidly obese ([place]) 08/23/2020  . Essential hypertension 08/23/2020  . New onset type 2  diabetes mellitus ([place]) 08/23/2020  . Dyslipidemia 08/23/2020  . Acute respiratory failure with hypoxia ([place])   . Pulmonary embolism ([place]) 08/18/2020  . Vaginal bleeding, abnormal 08/18/2020  . Obesity, morbid, BMI 50 or higher ([place]) 08/18/2020   PCP:  Gladstone Lighter, MD Pharmacy:   [facility] [phone], [place] AT [address] [place] [address] Phone: [phone] Fax: [phone]     Social Determinants of Health (SDOH) Interventions    Readmission Risk Interventions No flowsheet data found.

## 2020-09-07 NOTE — Progress Notes (Addendum)
PROGRESS NOTE  Courtney Mack UJW:[id number] DOB: 05-07-1956 DOA: 09/06/2020 PCP: Gladstone Lighter, MD  Brief History   82yow PMH recent DVT, PE s/p embolectomy and IVC filter placement discharged on Xarelto presented to ED w/ vaginal bleeding. U/s worrisome for endometrial carcinoma. Admitted for bleeding on anticoagulation, GYN eval. seen by GYN, had friable cervix on exam, could not definitely access the endometrium.  Concern for cervical or endometrial carcinoma.  Specimen sent, pathology pending.  Hemoglobin improved status post transfusion, continues to have some blood, appears to be minor.  Will restart anticoagulation and continue Aygestin.  A & P  Vaginal bleeding acute on chronic intermittent exacerbated by Xarelto w/ u/s concerning for endometrial carcinoma w/ associated acute blood loss anemia --Appears to have minimal bleeding at this time.  Continue Aygestin.  Await pathology.  Trend hemoglobin.  PMH Massive bilateral PE s/p embolectomy and thrombolysis and placement IVC, bilateral LE DVT 08/24/20 --Asymptomatic restart Xarelto given recent PE, DVT and use now of Aygestin.  DM type 2, A1c 10.1 2/13 --hold metformin as inpt --Continue SSI  Essential HTN --Remained stable, Losartan  Morbid obesity Body mass index is 52.25 kg/m. --as per dietician    Disposition Plan:  Discussion: discussed w/ patient, she is aware of suspected diagnosis. Once biopsy result available will schedule follow-up with GYN oncology hopefully 3/9.  Status is: Inpatient  Remains inpatient appropriate because:Inpatient level of care appropriate due to severity of illness   Dispo: The patient is from: Home              Anticipated d/c is to: Home              Patient currently is not medically stable to d/c.   Difficult to place patient No  DVT prophylaxis: SCDs Start: 09/06/20 5621   Code Status: Full Code Level of care: Med-Surg Family Communication: discussed with daughter by  telephone w/ patient's full permission.  Murray Hodgkins, MD  [facility] Direct contact: see [email or web address] (further directions at bottom of note if needed) 7PM-7AM contact night coverage as at bottom of note 09/07/2020, 3:10 PM  LOS: 1 day   Significant Hospital Events   .    Consults:    GYN   Procedures:  . 1 unit PRBC 3/2 . 3/2 bedside cervical/endometrial biopsy  Significant Diagnostic Tests:  Marland Kitchen    Micro Data:  .    Antimicrobials:  .   Interval History/Subjective  CC: f/u bleeding  No pain, feels fine  Has had some bleeding still  Per RN no report of issues overnight.   Objective   Vitals:  Vitals:   09/07/20 0817 09/07/20 1205  BP: 119/65 122/62  Pulse: 84 89  Resp: 18 (!) 24  Temp: 97.7 F (36.5 C) 98.4 F (36.9 C)  SpO2: 95% 94%    Exam: Constitutional:   . Appears calm and comfortable ENMT:  . grossly normal hearing  Respiratory:  . CTA bilaterally, no w/r/r.  . Respiratory effort normal. Cardiovascular:  . RRR, no m/r/g . No LE extremity edema   Psychiatric:  . Mental status o Mood, affect appropriate  Some blood in purwick suction container, suspect urine w/ some blood  I have personally reviewed the following:   Today's Data  . Hgb up to 9.2 and stable post 1 unit PRBC 3/2 . BMP noted  Scheduled Meds: . sodium chloride   Intravenous Once  . losartan  25 mg Oral Daily  . mometasone-formoterol  2 puff Inhalation  BID  . norethindrone  5 mg Oral TID  . nystatin   Topical BID  . rivaroxaban  15 mg Oral BID   Followed by  . [START ON 09/10/2020] rivaroxaban  20 mg Oral Q supper  . rosuvastatin  10 mg Oral q1800   Continuous Infusions:   Principal Problem:   Vaginal bleeding, abnormal Active Problems:   Pulmonary embolism (HCC)   Morbidly obese (HCC)   Acute blood loss anemia   DVT (deep venous thrombosis) ([place])   LOS: 1 day   How to contact the [facility] Attending or Consulting provider 7A - 7P or covering provider  during after hours [place], for this patient?  1. Check the care team in [facility] and look for a) attending/consulting TRH provider listed and b) the [facility] team listed 2. Log into [email or web address] and use [place]'s universal password to access. If you do not have the password, please contact the hospital operator. 3. Locate the [facility] provider you are looking for under [facility] and page to a number that you can be directly reached. 4. If you still have difficulty reaching the provider, please page the [facility] (Director on Call) for the Hospitalists listed on amion for assistance.

## 2020-09-07 NOTE — Progress Notes (Signed)
Initial Nutrition Assessment  DOCUMENTATION CODES:   Morbid obesity  INTERVENTION:   Ensure Max protein supplement BID, each supplement provides 150kcal and 30g of protein.  MVI po daily   High protein diet education   NUTRITION DIAGNOSIS:   Increased nutrient needs related to catabolic illness (suspected endometrial carcinoma) as evidenced by estimated needs.  GOAL:   Patient will meet greater than or equal to 90% of their needs  MONITOR:   PO intake,Supplement acceptance,Labs,Weight trends,Skin,I & O's  REASON FOR ASSESSMENT:   Consult Assessment of nutrition requirement/status  ASSESSMENT:   65 y.o. caucasian female with medical history significant for recent left lower extremity DVT and PE status post embolectomy and IVC filter placement, discharged on p.o. Xarelto, hypertension, type II diabetes mellitus and dyslipidemia who presented to the emergency room with acute onset of worsening intermittent vaginal bleeding concerning for endometrial carcinoma.   Met with pt in room today. Pt reports good appetite and oral intake pta and in hospital; pt eating 100% of meals. RD discussed with pt the importance of adequate protein intake needed to preserve lean muscle. Pt is willing to drink Ensure Max protein in hospital. Recommended high protein diet at home. Per chart, pt appears weight stable at baseline.   Medications reviewed and include:   Labs reviewed: Hgb 9.2(L), Hct 29.7(L) AIC 6.6(H)- 2/12  NUTRITION - FOCUSED PHYSICAL EXAM:  Flowsheet Row Most Recent Value  Orbital Region No depletion  Upper Arm Region No depletion  Thoracic and Lumbar Region No depletion  Buccal Region No depletion  Temple Region No depletion  Clavicle Bone Region No depletion  Clavicle and Acromion Bone Region No depletion  Scapular Bone Region No depletion  Dorsal Hand No depletion  Patellar Region No depletion  Anterior Thigh Region No depletion  Posterior Calf Region No depletion   Edema (RD Assessment) None  Hair Reviewed  Eyes Reviewed  Mouth Reviewed  Skin Reviewed  Nails Reviewed     Diet Order:   Diet Order            Diet Carb Modified Fluid consistency: Thin; Room service appropriate? Yes  Diet effective now                EDUCATION NEEDS:   Education needs have been addressed  Skin:  Skin Assessment: Reviewed RN Assessment (ecchymosis)  Last BM:  3/1  Height:   Ht Readings from Last 1 Encounters:  09/05/20 _0  (1.473 m)    Weight:   Wt Readings from Last 1 Encounters:  09/05/20 113.4 kg    Ideal Body Weight:  44 kg  BMI:  Body mass index is 52.25 kg/m.  Estimated Nutritional Needs:   Kcal:  2100-2400kcal/day  Protein:  110-120g/day  Fluid:  1.3-1.5L/day  Koleen Distance MS, RD, LDN Please refer to [facility] for RD and/or RD on-call/weekend/after hours pager

## 2020-09-07 NOTE — Evaluation (Signed)
Physical Therapy Evaluation Patient Details Name: Courtney Mack MRN: [id number] DOB: Feb 10, 1956 Today's Date: 09/07/2020   History of Present Illness  67yow PMH recent DVT, PE s/p embolectomy and IVC filter placement discharged on Xarelto presented to ED w/ vaginal bleeding. U/s worrisome for endometrial carcinoma. Admitted for bleeding on anticoagulation, GYN eval. On 2L supplemental O2 now.    Clinical Impression  Patient received in bed, OT present in room. Patient requires min assist with supine to sit. SOB with this activity. Requires sitting at edge of bed performing pursed lip breathing for O2 sats to return to 93% from 88%. Patient performed sit to stand from elevated bed with min assist. She is able to take a few steps in room and stand at sink for cleaning up with min guard. Patient is easily fatigued with mobility. She required max assist to bring B LEs back up onto bed. Patient will continue to benefit from skilled PT while here to improve strength, activity endurance and safety with mobility.      Follow Up Recommendations SNF    Equipment Recommendations  Other (comment) (TBD, shower chair and hospital bed possibly)    Recommendations for Other Services       Precautions / Restrictions Precautions Precautions: Fall Precaution Comments: monitor O2 sats Restrictions Weight Bearing Restrictions: No Other Position/Activity Restrictions: use briefs/pad for OOB/mobility 2/2 bladder incontinence and vaginal bleeding      Mobility  Bed Mobility Overal bed mobility: Needs Assistance Bed Mobility: Supine to Sit;Sit to Supine     Supine to sit: Min assist;HOB elevated Sit to supine: Mod assist   General bed mobility comments: max assist for BLE mgt back to bed; heavy use of bed rails    Transfers Overall transfer level: Needs assistance Equipment used: Rolling walker (2 wheeled) Transfers: Sit to/from Stand Sit to Stand: Min assist;From elevated surface          General transfer comment: Cues for hand placement.  Ambulation/Gait Ambulation/Gait assistance: Min guard Gait Distance (Feet): 8 Feet Assistive device: Rolling walker (2 wheeled) Gait Pattern/deviations: Step-to pattern;Decreased step length - right;Decreased step length - left;Shuffle Gait velocity: decreased   General Gait Details: Patient is sob with minimal ambulation this visit. O2 sats upon sitting up on edge of bed in high 80%s. Educated patient in pursed lip breathing.  Stairs            Wheelchair Mobility    Modified Rankin (Stroke Patients Only)       Balance Overall balance assessment: Needs assistance Sitting-balance support: Feet supported Sitting balance-Leahy Scale: Good     Standing balance support: Bilateral upper extremity supported;During functional activity Standing balance-Leahy Scale: Fair Standing balance comment: pt able to use RUE to perform pericare in standing with LUE support on RW, no LOB noted                             Pertinent Vitals/Pain Pain Assessment: No/denies pain    Home Living Family/patient expects to be discharged to:: Skilled nursing facility Living Arrangements: Children Available Help at Discharge: Family;Available PRN/intermittently;Available 24 hours/day Type of Home: House Home Access: Ramped entrance     Home Layout: One level Home Equipment: Walker - 4 wheels;Cane - single point;Other (comment) Additional Comments: has lift recliner    Prior Function Level of Independence: Needs assistance   Gait / Transfers Assistance Needed: ambulated with rollator prior to recent admission household distances; assist for transfers in/out  of bed, has lift recliner that she relies on; since at rehab she reports walking up to 160' w/ RW and therapy  ADL's / Homemaking Assistance Needed: Mod indep with toileting, bathing; requires assist for socks/shoes, meal prep, cleaning, and driving (can drive but waiting for  R cataract surgery to be done before driving again)  Comments: Denies falls     Hand Dominance   Dominant Hand: Right    Extremity/Trunk Assessment   Upper Extremity Assessment Upper Extremity Assessment: Overall WFL for tasks assessed    Lower Extremity Assessment Lower Extremity Assessment: Overall WFL for tasks assessed;Generalized weakness    Cervical / Trunk Assessment Cervical / Trunk Assessment: Normal  Communication   Communication: No difficulties  Cognition Arousal/Alertness: Awake/alert Behavior During Therapy: WFL for tasks assessed/performed Overall Cognitive Status: Within Functional Limits for tasks assessed                                 General Comments: pt was A and O x 4      General Comments General comments (skin integrity, edema, etc.): very large blood clot on it (about the size of a walnut) attached to PureWick when removed, RN notified; on 2L, pt desats to high 80's with exertion, bouncing back quickly to low 90's with PLB    Exercises Other Exercises Other Exercises: bed mobility, STS ADL transfers, pericare in standing, edu in PLB to support breath recovery with activity   Assessment/Plan    PT Assessment Patient needs continued PT services  PT Problem List Decreased strength;Decreased range of motion;Decreased activity tolerance;Decreased balance;Decreased mobility;Decreased coordination;Decreased cognition;Decreased knowledge of use of DME;Decreased safety awareness;Decreased knowledge of precautions;Cardiopulmonary status limiting activity;Decreased skin integrity;Obesity       PT Treatment Interventions DME instruction;Therapeutic exercise;Gait training;Balance training;Functional mobility training;Therapeutic activities;Patient/family education    PT Goals (Current goals can be found in the Care Plan section)  Acute Rehab PT Goals Patient Stated Goal: go back to rehab to get stronger then go back home PT Goal  Formulation: With patient Time For Goal Achievement: 09/21/20 Potential to Achieve Goals: Good    Frequency Min 2X/week   Barriers to discharge        Co-evaluation PT/OT/SLP Co-Evaluation/Treatment: Yes Reason for Co-Treatment: For patient/therapist safety;Necessary to address cognition/behavior during functional activity PT goals addressed during session: Mobility/safety with mobility;Balance;Proper use of DME OT goals addressed during session: ADL's and self-care;Proper use of Adaptive equipment and DME       AM-PAC PT "6 Clicks" Mobility  Outcome Measure Help needed turning from your back to your side while in a flat bed without using bedrails?: A Little Help needed moving from lying on your back to sitting on the side of a flat bed without using bedrails?: A Lot Help needed moving to and from a bed to a chair (including a wheelchair)?: A Lot Help needed standing up from a chair using your arms (e.g., wheelchair or bedside chair)?: A Lot Help needed to walk in hospital room?: A Little Help needed climbing 3-5 steps with a railing? : A Little 6 Click Score: 15    End of Session Equipment Utilized During Treatment: Gait belt Activity Tolerance:  (limited by sob and O2 sats) Patient left: in bed;with bed alarm set Nurse Communication: Mobility status PT Visit Diagnosis: Muscle weakness (generalized) (M62.81);Difficulty in walking, not elsewhere classified (R26.2)    Time: 3664-4034 PT Time Calculation (min) (ACUTE ONLY): 15 min  Charges:   PT Evaluation $PT Eval Moderate Complexity: 1 Mod          Mckay Tegtmeyer, PT, GCS 09/07/20,3:34 PM

## 2020-09-07 NOTE — Care Management Important Message (Signed)
Important Message  Patient Details  Name: Courtney Mack MRN: [id number] Date of Birth: [birth year]   Medicare Important Message Given:  Yes     [name] 09/07/2020, 2:15 PM

## 2020-09-07 NOTE — NC FL2 (Signed)
[place] LEVEL OF CARE SCREENING TOOL     IDENTIFICATION  Patient Name: Courtney Mack Birthdate: 23-Nov-1955 Sex: female Admission Date (Current Location): 09/06/2020  [place] and [place] Number:  Engineering geologist and Address:  [facility], [address], [place], [place] [address]      Provider Number: [id number]  Attending Physician Name and Address:  [name], MD  Relative Name and Phone Number:       Current Level of Care: Hospital Recommended Level of Care: [place] Prior Approval Number:    Date Approved/Denied:   PASRR Number: [id number]  Discharge Plan: SNF    Current Diagnoses: Patient Active Problem List   Diagnosis Date Noted  . Acute blood loss anemia 09/06/2020  . Vaginal bleeding 09/06/2020  . DVT (deep venous thrombosis) ([place]) 09/06/2020  . Anemia, unspecified 08/24/2020  . Morbidly obese ([place]) 08/23/2020  . Essential hypertension 08/23/2020  . New onset type 2 diabetes mellitus ([place]) 08/23/2020  . Dyslipidemia 08/23/2020  . Acute respiratory failure with hypoxia ([place])   . Pulmonary embolism ([place]) 08/18/2020  . Vaginal bleeding, abnormal 08/18/2020  . Obesity, morbid, BMI 50 or higher ([place]) 08/18/2020    Orientation RESPIRATION BLADDER Height & Weight     Self,Situation,Time,Place  O2 (Nasal Canula 2 L) Incontinent Weight: 250 lb (113.4 kg) Height:  4\' 10"  (147.3 cm)  BEHAVIORAL SYMPTOMS/MOOD NEUROLOGICAL BOWEL NUTRITION STATUS   (None)   Continent Diet (Carb modified)  AMBULATORY STATUS COMMUNICATION OF NEEDS Skin   Limited Assist Verbally Skin abrasions,Bruising,Other (Comment) (Exoriated location)                       Personal Care Assistance Level of Assistance  Bathing,Feeding,Dressing Bathing Assistance: Limited assistance Feeding assistance: Limited assistance Dressing Assistance: Limited assistance     Functional Limitations Info   Sight,Hearing,Speech Sight Info: Adequate Hearing Info: Adequate Speech Info: Adequate    SPECIAL CARE FACTORS FREQUENCY  PT (By licensed PT),OT (By licensed OT)     PT Frequency: 5 x week OT Frequency: 5 x week            Contractures Contractures Info: Not present    Additional Factors Info  Code Status,Allergies Code Status Info: Full code Allergies Info: NKDA           Current Medications (09/07/2020):  This is the current hospital active medication list Current Facility-Administered Medications  Medication Dose Route Frequency Provider Last Rate Last Admin  . 0.9 %  sodium chloride infusion (Manually program via Guardrails IV Fluids)   Intravenous Once Sharion Settler, NP      . acetaminophen (TYLENOL) tablet 650 mg  650 mg Oral Q6H PRN Oswald Hillock, RPH       Or  . acetaminophen (TYLENOL) suppository 650 mg  650 mg Rectal Q6H PRN Oswald Hillock, RPH      . losartan (COZAAR) tablet 25 mg  25 mg Oral Daily Mansy, Jan A, MD   25 mg at 09/07/20 6712  . magnesium hydroxide (MILK OF MAGNESIA) suspension 30 mL  30 mL Oral Daily PRN Mansy, Jan A, MD      . mometasone-formoterol [facility]) 100-5 MCG/ACT inhaler 2 puff  2 puff Inhalation BID Mansy, Arvella Merles, MD   2 puff at 09/07/20 [phone]  . [START ON 09/08/2020] multivitamin with minerals tablet 1 tablet  1 tablet Oral Daily [name], MD      . norethindrone (  AYGESTIN) tablet 5 mg  5 mg Oral TID Rudene Re, MD   5 mg at 09/07/20 6222  . nystatin (MYCOSTATIN/NYSTOP) topical powder   Topical BID Mansy, Arvella Merles, MD   Given at 09/07/20 [phone]  . ondansetron (ZOFRAN) tablet 4 mg  4 mg Oral Q6H PRN Mansy, Jan A, MD       Or  . ondansetron [facility]) injection 4 mg  4 mg Intravenous Q6H PRN Mansy, Jan A, MD      . protein supplement (ENSURE MAX) liquid  11 oz Oral BID [name], MD      . Rivaroxaban [facility]) tablet 15 mg  15 mg Oral BID [name], MD   15 mg at 09/07/20 1203   Followed by  . [START ON  09/10/2020] rivaroxaban (XARELTO) tablet 20 mg  20 mg Oral Q supper [name], MD      . rosuvastatin (CRESTOR) tablet 10 mg  10 mg Oral q1800 Mansy, Jan A, MD      . traZODone (DESYREL) tablet 25 mg  25 mg Oral QHS PRN Mansy, Arvella Merles, MD         Discharge Medications: Please see discharge summary for a list of discharge medications.  Relevant Imaging Results:  Relevant Lab Results:   Additional Information SS#: [id number]  Candie Chroman, LCSW

## 2020-09-08 DIAGNOSIS — D62 Acute posthemorrhagic anemia: Secondary | ICD-10-CM | POA: Diagnosis not present

## 2020-09-08 DIAGNOSIS — I2602 Saddle embolus of pulmonary artery with acute cor pulmonale: Secondary | ICD-10-CM | POA: Diagnosis not present

## 2020-09-08 DIAGNOSIS — I2782 Chronic pulmonary embolism: Secondary | ICD-10-CM | POA: Diagnosis not present

## 2020-09-08 DIAGNOSIS — N939 Abnormal uterine and vaginal bleeding, unspecified: Secondary | ICD-10-CM | POA: Diagnosis not present

## 2020-09-08 LAB — CBC
HCT: 28.4 % — ABNORMAL LOW (ref 36.0–46.0)
Hemoglobin: 8.6 g/dL — ABNORMAL LOW (ref 12.0–15.0)
MCH: 24.9 pg — ABNORMAL LOW (ref 26.0–34.0)
MCHC: 30.3 g/dL (ref 30.0–36.0)
MCV: 82.3 fL (ref 80.0–100.0)
Platelets: 253 10*3/uL (ref 150–400)
RBC: 3.45 MIL/uL — ABNORMAL LOW (ref 3.87–5.11)
RDW: 17.3 % — ABNORMAL HIGH (ref 11.5–15.5)
WBC: 7.8 10*3/uL (ref 4.0–10.5)
nRBC: 0 % (ref 0.0–0.2)

## 2020-09-08 NOTE — TOC Progression Note (Signed)
Transition of Care [facility]) - Progression Note    Patient Details  Name: MAXCINE STRONG MRN: [id number] Date of Birth: 07-19-1955  Transition of [facility]) CM/SW [place], LCSW Phone Number: 09/08/2020, 10:13 AM  Clinical Narrative:  [facility] bed available on Monday. Admissions coordinator will start insurance authorization today. Will ask weekend [facility] team to request COVID test on Sunday.   Expected Discharge Plan: [place] Barriers to Discharge: Continued Medical Work up  Expected Discharge Plan and Services Expected Discharge Plan: [place] Choice: [place] arrangements for the past 2 months: [place]                                       Social Determinants of Health (SDOH) Interventions    Readmission Risk Interventions No flowsheet data found.

## 2020-09-08 NOTE — Progress Notes (Signed)
PROGRESS NOTE  Courtney Mack IOX:[id number] DOB: 1956/06/09 DOA: 09/06/2020 PCP: Gladstone Lighter, MD  Brief History   89yow PMH recent DVT, PE s/p embolectomy and IVC filter placement discharged on Xarelto presented to ED w/ vaginal bleeding. U/s worrisome for endometrial carcinoma. Admitted for bleeding on anticoagulation, GYN eval. seen by GYN, had friable cervix on exam, could not definitely access the endometrium.  Concern for cervical or endometrial carcinoma.  Specimen sent, pathology pending.  Hemoglobin improved status post transfusion, continues to have some blood, appears to be minor.  Will restart anticoagulation and continue Aygestin.  A & P  Vaginal bleeding acute on chronic intermittent exacerbated by Xarelto w/ u/s concerning for endometrial carcinoma w/ associated acute blood loss anemia --suspect very minimal bleeding in urine purwick --Hgb stable, will continue Xarelto and follow CBC --continue Aygestin. --await pathology, will refer to see GYN oncology next week  PMH Massive bilateral PE s/p embolectomy and thrombolysis and placement IVC, bilateral LE DVT 08/24/20 --continue Xarelto given recent PE, DVT and use now of Aygestin.  DM type 2, A1c 10.1 2/13 --CBG stable, will hold metformin as inpt --will continue SSI  Essential HTN --stable Losartan  Morbid obesity Body mass index is 52.25 kg/m. --as per dietician    Disposition Plan:  Discussion: stable, follow Hgb, if stable overweekend can return to SNF (per TOC no availability to transfer over weekend.  Status is: Inpatient  Remains inpatient appropriate because:Inpatient level of care appropriate due to severity of illness   Dispo: The patient is from: Home              Anticipated d/c is to: Home              Patient currently is not medically stable to d/c.   Difficult to place patient No  DVT prophylaxis: SCDs Start: 09/06/20 2683   Code Status: Full Code Level of care: Med-Surg Family  Communication:    Murray Hodgkins, MD  [facility] Direct contact: see [email or web address] (further directions at bottom of note if needed) 7PM-7AM contact night coverage as at bottom of note 09/08/2020, 4:46 PM  LOS: 2 days   Significant Hospital Events   .    Consults:    GYN   Procedures:  . 1 unit PRBC 3/2 . 3/2 bedside cervical/endometrial biopsy  Significant Diagnostic Tests:  Marland Kitchen    Micro Data:  .    Antimicrobials:  .   Interval History/Subjective  CC: f/u bleeding  Feels better no complaints today No issues reported by nursing   Objective   Vitals:  Vitals:   09/08/20 1417 09/08/20 1554  BP:  132/66  Pulse:  82  Resp:  16  Temp:  98.4 F (36.9 C)  SpO2: 91% 92%    Exam: Constitutional:   . Appears calm and comfortable ENMT:  . grossly normal hearing  Respiratory:  . CTA bilaterally, no w/r/r.  . Respiratory effort normal.  Cardiovascular:  . RRR, no m/r/g . No LE extremity edema   Psychiatric:  . Mental status o Mood, affect appropriate  Urine is blood tinged   I have personally reviewed the following:   Today's Data  . Hgb stable at 8.6  Scheduled Meds: . sodium chloride   Intravenous Once  . losartan  25 mg Oral Daily  . mometasone-formoterol  2 puff Inhalation BID  . multivitamin with minerals  1 tablet Oral Daily  . norethindrone  5 mg Oral TID  . nystatin   Topical  BID  . Ensure Max Protein  11 oz Oral BID  . rivaroxaban  15 mg Oral BID   Followed by  . [START ON 09/10/2020] rivaroxaban  20 mg Oral Q supper  . rosuvastatin  10 mg Oral q1800   Continuous Infusions:   Principal Problem:   Vaginal bleeding, abnormal Active Problems:   Pulmonary embolism (HCC)   Morbidly obese (HCC)   Acute blood loss anemia   DVT (deep venous thrombosis) ([place])   LOS: 2 days   How to contact the [facility] Attending or Consulting provider [place] or covering provider during after hours [place], for this patient?  1. Check the care team in [facility]  and look for a) attending/consulting TRH provider listed and b) the [facility] team listed 2. Log into [email or web address] and use [place]'s universal password to access. If you do not have the password, please contact the hospital operator. 3. Locate the [facility] provider you are looking for under [facility] and page to a number that you can be directly reached. 4. If you still have difficulty reaching the provider, please page the [facility] (Director on Call) for the Hospitalists listed on amion for assistance.

## 2020-09-08 NOTE — Plan of Care (Signed)
Continuing with plan of care. 

## 2020-09-08 NOTE — Progress Notes (Signed)
Physical Therapy Treatment Patient Details Name: Courtney Mack MRN: [id number] DOB: [birth year] Today's Date: 09/08/2020    History of Present Illness 21yow PMH recent DVT, PE s/p embolectomy and IVC filter placement discharged on Xarelto presented to ED w/ vaginal bleeding. U/s worrisome for endometrial carcinoma. Admitted for bleeding on anticoagulation, GYN eval. On 2L supplemental O2 now.    PT Comments    Patient is alert and cooperative, agrees to PT session. Patient reports she is having a good day. She performs bed mobility supine to sit with mod independence. Requires max assist to bring LEs back into bed. Patient transfers sit to stand with min guard. Ambulated 25 feet with RW and supervision with 1 lpm O2. Upon returning to bed O2 sats down to 87%. With rest she returned to 92%. Patient will continue to benefit from skilled PT while here to improve strength and activity tolerance.       Follow Up Recommendations  SNF     Equipment Recommendations  None recommended by PT    Recommendations for Other Services       Precautions / Restrictions Precautions Precautions: Fall Precaution Comments: monitor O2 sats Restrictions Weight Bearing Restrictions: No Other Position/Activity Restrictions: use briefs/pad for OOB/mobility 2/2 bladder incontinence and vaginal bleeding    Mobility  Bed Mobility Overal bed mobility: Needs Assistance Bed Mobility: Supine to Sit     Supine to sit: Modified independent (Device/Increase time);HOB elevated Sit to supine: Max assist   General bed mobility comments: max assist for BLE mgt back to bed; heavy use of bed rails    Transfers Overall transfer level: Needs assistance Equipment used: Rolling walker (2 wheeled) Transfers: Sit to/from Stand Sit to Stand: Min guard            Ambulation/Gait Ambulation/Gait assistance: Min Gaffer (Feet): 25 Feet Assistive device: Rolling walker (2 wheeled) Gait  Pattern/deviations: Step-to pattern;Decreased step length - right;Decreased step length - left;Shuffle Gait velocity: decreased   General Gait Details: Patient is SOB with bed mobility, difficulty talking. O2 sats down to 87% on 1 lpm during session. Up to 92% at rest   Stairs             Wheelchair Mobility    Modified Rankin (Stroke Patients Only)       Balance Overall balance assessment: Modified Independent Sitting-balance support: Feet supported Sitting balance-Leahy Scale: Good     Standing balance support: Bilateral upper extremity supported;During functional activity Standing balance-Leahy Scale: Good Standing balance comment: Patient requires min guard, supervision for ambulation                            Cognition Arousal/Alertness: Awake/alert Behavior During Therapy: WFL for tasks assessed/performed Overall Cognitive Status: Within Functional Limits for tasks assessed                                 General Comments: pt was A and O x 4      Exercises      General Comments        Pertinent Vitals/Pain Pain Assessment: No/denies pain    Home Living                      Prior Function            PT Goals (current goals can now be found in the  care plan section) Acute Rehab PT Goals Patient Stated Goal: go back to rehab to get stronger then go back home PT Goal Formulation: With patient Time For Goal Achievement: 09/21/20 Potential to Achieve Goals: Good Progress towards PT goals: Progressing toward goals    Frequency    Min 2X/week      PT Plan Current plan remains appropriate    Co-evaluation              AM-PAC PT "6 Clicks" Mobility   Outcome Measure  Help needed turning from your back to your side while in a flat bed without using bedrails?: A Little Help needed moving from lying on your back to sitting on the side of a flat bed without using bedrails?: A Little Help needed moving  to and from a bed to a chair (including a wheelchair)?: A Little Help needed standing up from a chair using your arms (e.g., wheelchair or bedside chair)?: A Little Help needed to walk in hospital room?: A Little Help needed climbing 3-5 steps with a railing? : A Lot 6 Click Score: 17    End of Session Equipment Utilized During Treatment: Oxygen Activity Tolerance: Patient tolerated treatment well;Other (comment) (limited by SOB abd O2 saturations) Patient left: in bed;with bed alarm set Nurse Communication: Mobility status PT Visit Diagnosis: Muscle weakness (generalized) (M62.81);Difficulty in walking, not elsewhere classified (R26.2)     Time: 1345-1415 PT Time Calculation (min) (ACUTE ONLY): 30 min  Charges:  $Gait Training: 8-22 mins $Therapeutic Activity: 8-22 mins                     [employer], PT, GCS 09/08/20,2:24 PM

## 2020-09-08 NOTE — Progress Notes (Signed)
Mobility Specialist - Progress Note   09/08/20 1700  Mobility  Activity Refused mobility  Mobility performed by Mobility specialist    Pt lying in bed eating supper upon arrival, requests to return later. Will attempt session another date.   Kathee Delton Mobility Specialist 09/08/20, 5:07 PM

## 2020-09-08 NOTE — Progress Notes (Signed)
PT Cancellation Note  Patient Details Name: HAZELINE CHARNLEY MRN: [id number] DOB: [birth year]   Cancelled Treatment:    Reason Eval/Treat Not Completed: Other (comment) (patient had BM in bed and was waiting to be cleaned up.) Will re-attempt later today as time allows.   Conetta,Kristyn 09/08/2020, 11:57 AM

## 2020-09-08 NOTE — Progress Notes (Addendum)
PROGRESS NOTE  Courtney Mack:[id number] DOB: 1955/08/25 DOA: 09/06/2020 PCP: Gladstone Lighter, MD  Brief History   17yow PMH recent DVT, PE s/p embolectomy and IVC filter placement discharged on Xarelto presented to ED w/ vaginal bleeding. U/s worrisome for endometrial carcinoma. Admitted for bleeding on anticoagulation, GYN eval. seen by GYN, had friable cervix on exam, could not definitely access the endometrium.  Concern for cervical or endometrial carcinoma.  Specimen sent, pathology pending.  Hemoglobin improved status post transfusion, continues to have some blood, appears to be minor.  Will restart anticoagulation and continue Aygestin.  A & P  Vaginal bleeding acute on chronic intermittent exacerbated by Xarelto w/ u/s concerning for endometrial carcinoma w/ associated acute blood loss anemia --suspect very minimal bleeding in urine purwick --Hgb stable, will continue Xarelto and follow CBC --continue Aygestin. --await pathology, will refer to see GYN oncology next week  PMH Massive bilateral PE s/p embolectomy and thrombolysis and placement IVC, bilateral LE DVT 08/24/20 --continue Xarelto given recent PE, DVT and use now of Aygestin.  DM type 2, A1c 10.1 2/13 --CBG stable, will hold metformin as inpt --will continue SSI  Essential HTN --stable Losartan  Morbid obesity Body mass index is 52.25 kg/m. --as per dietician    Disposition Plan:  Discussion: stable, follow Hgb, if stable overweekend can return to SNF (per TOC no availability to transfer over weekend.  Status is: Inpatient  Remains inpatient appropriate because:Inpatient level of care appropriate due to severity of illness   Dispo: The patient is from: SNF              Anticipated d/c is to: SNF              Patient currently is not medically stable to d/c.   Difficult to place patient No  DVT prophylaxis: SCDs Start: 09/06/20 8119   Code Status: Full Code Level of care: Med-Surg Family  Communication: updated daughter by telephone.   Murray Hodgkins, MD  [facility] Direct contact: see [email or web address] (further directions at bottom of note if needed) 7PM-7AM contact night coverage as at bottom of note 09/08/2020, 6:12 PM  LOS: 2 days   Significant Hospital Events   .    Consults:    GYN   Procedures:  . 1 unit PRBC 3/2 . 3/2 bedside cervical/endometrial biopsy  Significant Diagnostic Tests:  Marland Kitchen    Micro Data:  .    Antimicrobials:  .   Interval History/Subjective  CC: f/u bleeding  Feels better no complaints today No issues reported by nursing   Objective   Vitals:  Vitals:   09/08/20 1417 09/08/20 1554  BP:  132/66  Pulse:  82  Resp:  16  Temp:  98.4 F (36.9 C)  SpO2: 91% 92%    Exam: Constitutional:   . Appears calm and comfortable ENMT:  . grossly normal hearing  Respiratory:  . CTA bilaterally, no w/r/r.  . Respiratory effort normal.  Cardiovascular:  . RRR, no m/r/g . No LE extremity edema   Psychiatric:  . Mental status o Mood, affect appropriate  Urine is blood tinged   I have personally reviewed the following:   Today's Data  . Hgb stable at 8.6  Scheduled Meds: . sodium chloride   Intravenous Once  . losartan  25 mg Oral Daily  . mometasone-formoterol  2 puff Inhalation BID  . multivitamin with minerals  1 tablet Oral Daily  . norethindrone  5 mg Oral TID  . nystatin  Topical BID  . Ensure Max Protein  11 oz Oral BID  . rivaroxaban  15 mg Oral BID   Followed by  . [START ON 09/10/2020] rivaroxaban  20 mg Oral Q supper  . rosuvastatin  10 mg Oral q1800   Continuous Infusions:   Principal Problem:   Vaginal bleeding, abnormal Active Problems:   Pulmonary embolism (HCC)   Morbidly obese (HCC)   Acute blood loss anemia   DVT (deep venous thrombosis) ([place])   LOS: 2 days   How to contact the [facility] Attending or Consulting provider [place] or covering provider during after hours [place], for this patient?   1. Check the care team in [facility] and look for a) attending/consulting TRH provider listed and b) the [facility] team listed 2. Log into [email or web address] and use [place]'s universal password to access. If you do not have the password, please contact the hospital operator. 3. Locate the [facility] provider you are looking for under [facility] and page to a number that you can be directly reached. 4. If you still have difficulty reaching the provider, please page the [facility] (Director on Call) for the Hospitalists listed on amion for assistance.

## 2020-09-08 NOTE — Progress Notes (Signed)
Occupational Therapy Treatment Patient Details Name: Courtney Mack MRN: [id number] DOB: 1956/05/24 Today's Date: 09/08/2020    History of present illness 89yow PMH recent DVT, PE s/p embolectomy and IVC filter placement discharged on Xarelto presented to ED w/ vaginal bleeding. U/s worrisome for endometrial carcinoma. Admitted for bleeding on anticoagulation, GYN eval. On 2L supplemental O2 now.   OT comments  Pt seen for OT tx this date. Pt agreeable and denied pain, SOB. Pt required increased effort and time with heavy use of the bed rails to perform bed mobility to sit EOB. CGA with bed elevated and cues for hand placement to stand. Pt ambulated to the bathroom where she was able to doff her soiled brief (tear away sides). Pt required MOD A for hygiene and initiating donning of new brief over feet and above knees. In standing, pt was able to complete donning of brief over hips with CGA. Pt tolerated toileting and ADL mobility well, endorsed some fatigue afterwards. Pt desats to 86% on room air during ADL/mobility, and improves back up to 91-92% when placed back on 1.5L O2 via nasal cannula. Pt continues to benefit from skilled OT services. Continue to recommend SNF at this time.     Follow Up Recommendations  SNF    Equipment Recommendations  3 in 1 bedside commode;Other (comment) (toileting aide)    Recommendations for Other Services      Precautions / Restrictions Precautions Precautions: Fall Precaution Comments: monitor O2 sats Restrictions Weight Bearing Restrictions: No Other Position/Activity Restrictions: use briefs/pad for OOB/mobility 2/2 bladder incontinence and vaginal bleeding       Mobility Bed Mobility Overal bed mobility: Needs Assistance Bed Mobility: Supine to Sit;Sit to Supine     Supine to sit: Modified independent (Device/Increase time);HOB elevated Sit to supine: Mod assist   General bed mobility comments: MOD A for BLE mgt back to bed; heavy use of  bed rails for sup>sit    Transfers Overall transfer level: Needs assistance Equipment used: Rolling walker (2 wheeled) Transfers: Sit to/from Stand Sit to Stand: Min guard;From elevated surface         General transfer comment: elevated bed, VC for hand placement    Balance Overall balance assessment: Needs assistance Sitting-balance support: Feet supported Sitting balance-Leahy Scale: Good     Standing balance support: Bilateral upper extremity supported;During functional activity;Single extremity supported;No upper extremity supported Standing balance-Leahy Scale: Fair Standing balance comment: able to let go of RW in standing to complete donning of brief over hips                           ADL either performed or assessed with clinical judgement   ADL Overall ADL's : Needs assistance/impaired                         Toilet Transfer: Min guard;Grab bars;RW;Ambulation   Toileting- Clothing Manipulation and Hygiene: Moderate assistance;Sit to/from stand Toileting - Clothing Manipulation Details (indicate cue type and reason): assist for clothing mgt to initiate donning of brief over feet and for pericare/hygiene 2/2 body habitus/limited access             Vision Baseline Vision/History: Wears glasses;Cataracts Wears Glasses: At all times     Perception     Praxis      Cognition Arousal/Alertness: Awake/alert Behavior During Therapy: [facility] for tasks assessed/performed Overall Cognitive Status: Within Functional Limits for tasks assessed  General Comments: pt was A and O x 4        Exercises Other Exercises Other Exercises: Pt ambulated with RW and CGA to bathroom, requiring use of grab bar and RW to perform transfer and MOD A for hygiene and clothing mgt   Shoulder Instructions       General Comments      Pertinent Vitals/ Pain       Pain Assessment: No/denies pain  Home Living                                           Prior Functioning/Environment              Frequency  Min 1X/week        Progress Toward Goals  OT Goals(current goals can now be found in the care plan section)  Progress towards OT goals: Progressing toward goals  Acute Rehab OT Goals Patient Stated Goal: go back to rehab to get stronger then go back home OT Goal Formulation: With patient Time For Goal Achievement: 09/21/20 Potential to Achieve Goals: Good  Plan Discharge plan remains appropriate;Frequency remains appropriate    Co-evaluation                 AM-PAC OT "6 Clicks" Daily Activity     Outcome Measure   Help from another person eating meals?: None Help from another person taking care of personal grooming?: A Little Help from another person toileting, which includes using toliet, bedpan, or urinal?: A Little Help from another person bathing (including washing, rinsing, drying)?: A Lot Help from another person to put on and taking off regular upper body clothing?: None Help from another person to put on and taking off regular lower body clothing?: A Lot 6 Click Score: 18    End of Session Equipment Utilized During Treatment: Gait belt;Rolling walker;Oxygen  OT Visit Diagnosis: Other abnormalities of gait and mobility (R26.89);Muscle weakness (generalized) (M62.81)   Activity Tolerance Patient tolerated treatment well   Patient Left in bed;with call bell/phone within reach;with bed alarm set   Nurse Communication Mobility status        Time: 5038-8828 OT Time Calculation (min): 16 min  Charges: OT General Charges $OT Visit: 1 Visit OT Treatments $Self Care/Home Management : 8-22 mins  Hanley Hays, MPH, MS, OTR/L ascom [phone] 09/08/20, 3:58 PM

## 2020-09-09 DIAGNOSIS — D62 Acute posthemorrhagic anemia: Secondary | ICD-10-CM | POA: Diagnosis not present

## 2020-09-09 DIAGNOSIS — I825Y3 Chronic embolism and thrombosis of unspecified deep veins of proximal lower extremity, bilateral: Secondary | ICD-10-CM | POA: Diagnosis not present

## 2020-09-09 DIAGNOSIS — I2602 Saddle embolus of pulmonary artery with acute cor pulmonale: Secondary | ICD-10-CM | POA: Diagnosis not present

## 2020-09-09 DIAGNOSIS — N939 Abnormal uterine and vaginal bleeding, unspecified: Secondary | ICD-10-CM | POA: Diagnosis not present

## 2020-09-09 LAB — CBC
HCT: 30.3 % — ABNORMAL LOW (ref 36.0–46.0)
Hemoglobin: 9 g/dL — ABNORMAL LOW (ref 12.0–15.0)
MCH: 24.8 pg — ABNORMAL LOW (ref 26.0–34.0)
MCHC: 29.7 g/dL — ABNORMAL LOW (ref 30.0–36.0)
MCV: 83.5 fL (ref 80.0–100.0)
Platelets: 267 10*3/uL (ref 150–400)
RBC: 3.63 MIL/uL — ABNORMAL LOW (ref 3.87–5.11)
RDW: 17.2 % — ABNORMAL HIGH (ref 11.5–15.5)
WBC: 8.1 10*3/uL (ref 4.0–10.5)
nRBC: 0 % (ref 0.0–0.2)

## 2020-09-09 MED ORDER — POLYVINYL ALCOHOL 1.4 % OP SOLN
1.0000 [drp] | OPHTHALMIC | Status: DC | PRN
  Administered 2020-09-11: 1 [drp] via OPHTHALMIC
  Filled 2020-09-09: qty 15

## 2020-09-09 MED ORDER — HYPROMELLOSE (GONIOSCOPIC) 2.5 % OP SOLN
1.0000 [drp] | OPHTHALMIC | Status: DC | PRN
  Filled 2020-09-09: qty 15

## 2020-09-09 NOTE — Progress Notes (Signed)
PROGRESS NOTE  Courtney SHORKEY CWC:[id number] DOB: [birth year] DOA: 09/06/2020 PCP: Gladstone Lighter, MD  Brief History   57yow PMH recent DVT, PE s/p embolectomy and IVC filter placement discharged on Xarelto presented to ED w/ vaginal bleeding. U/s worrisome for endometrial carcinoma. Admitted for bleeding on anticoagulation, GYN eval. seen by GYN, had friable cervix on exam, could not definitely access the endometrium.  Concern for cervical or endometrial carcinoma.  Specimen sent, pathology pending.  Hemoglobin improved status post transfusion, continues to have some blood, appears to be minor.  No significant blood loss identified at this point.  Hemoglobin remained stable.  Continue to monitor.  A & P  Vaginal bleeding acute on chronic intermittent exacerbated by Xarelto w/ u/s concerning for endometrial carcinoma w/ associated acute blood loss anemia --Very minimal bleeding in urine purwick --Hgb remains stable, continue Xarelto and follow CBC --continue Aygestin. --await pathology, will refer to see GYN oncology next week  PMH Massive bilateral PE s/p embolectomy and thrombolysis and placement IVC, bilateral LE DVT 08/24/20 --continue Xarelto given recent PE, DVT and use now of Aygestin.  DM type 2, A1c 10.1 2/13 --CBG stable, will hold metformin as inpt --will continue SSI  Essential HTN --stable Losartan  Morbid obesity Body mass index is 52.25 kg/m. --as per dietician    Disposition Plan:  Discussion: Hemoglobin remained stable, continue anticoagulation, CBC in a.m.  Anticipate transfer to SNF 3/7.  Status is: Inpatient  Remains inpatient appropriate because:Inpatient level of care appropriate due to severity of illness   Dispo: The patient is from: SNF              Anticipated d/c is to: SNF              Patient currently is not medically stable to d/c.   Difficult to place patient No  DVT prophylaxis: SCDs Start: 09/06/20 7616   Code Status: Full Code Level  of care: Med-Surg Family Communication:    Murray Hodgkins, MD  [facility] Direct contact: see [email or web address] (further directions at bottom of note if needed) 7PM-7AM contact night coverage as at bottom of note 09/09/2020, 3:29 PM  LOS: 3 days   Significant Hospital Events   .    Consults:    GYN   Procedures:  . 1 unit PRBC 3/2 . 3/2 bedside cervical/endometrial biopsy  Significant Diagnostic Tests:  Marland Kitchen    Micro Data:  .    Antimicrobials:  .   Interval History/Subjective  CC: f/u bleeding  Feels ok except right eye is itching  Objective   Vitals:  Vitals:   09/09/20 0743 09/09/20 1107  BP: (!) 123/59 130/67  Pulse: 82 84  Resp: 16 18  Temp: 98 F (36.7 C) 97.8 F (36.6 C)  SpO2: 96% 92%    Exam: Constitutional:   . Appears calm and comfortable Eyes:  Sclera are white, right eye appears unremarkable, no scleral injection ENMT:  . grossly normal hearing  Respiratory:  . CTA bilaterally, no w/r/r.  . Respiratory effort normal.  Cardiovascular:  . RRR, no m/r/g . No LE extremity edema   Psychiatric:  . Mental status o Mood, affect appropriate  I have personally reviewed the following:   Today's Data  . Hgb remains stable at 9  Scheduled Meds: . sodium chloride   Intravenous Once  . losartan  25 mg Oral Daily  . mometasone-formoterol  2 puff Inhalation BID  . multivitamin with minerals  1 tablet Oral Daily  .  norethindrone  5 mg Oral TID  . nystatin   Topical BID  . Ensure Max Protein  11 oz Oral BID  . rivaroxaban  15 mg Oral BID   Followed by  . [START ON 09/10/2020] rivaroxaban  20 mg Oral Q supper  . rosuvastatin  10 mg Oral q1800   Continuous Infusions:   Principal Problem:   Vaginal bleeding, abnormal Active Problems:   Pulmonary embolism (HCC)   Morbidly obese (HCC)   Acute blood loss anemia   DVT (deep venous thrombosis) ([place])   LOS: 3 days   How to contact the [facility] Attending or Consulting provider [place] or covering  provider during after hours [place], for this patient?  1. Check the care team in [facility] and look for a) attending/consulting TRH provider listed and b) the [facility] team listed 2. Log into [email or web address] and use [place]'s universal password to access. If you do not have the password, please contact the hospital operator. 3. Locate the [facility] provider you are looking for under [facility] and page to a number that you can be directly reached. 4. If you still have difficulty reaching the provider, please page the [facility] (Director on Call) for the Hospitalists listed on amion for assistance.

## 2020-09-09 NOTE — Plan of Care (Signed)
Continuing with plan of care. 

## 2020-09-10 DIAGNOSIS — I2782 Chronic pulmonary embolism: Secondary | ICD-10-CM | POA: Diagnosis not present

## 2020-09-10 DIAGNOSIS — N939 Abnormal uterine and vaginal bleeding, unspecified: Secondary | ICD-10-CM | POA: Diagnosis not present

## 2020-09-10 DIAGNOSIS — D62 Acute posthemorrhagic anemia: Secondary | ICD-10-CM | POA: Diagnosis not present

## 2020-09-10 DIAGNOSIS — I2602 Saddle embolus of pulmonary artery with acute cor pulmonale: Secondary | ICD-10-CM | POA: Diagnosis not present

## 2020-09-10 LAB — CBC
HCT: 31.1 % — ABNORMAL LOW (ref 36.0–46.0)
Hemoglobin: 9.5 g/dL — ABNORMAL LOW (ref 12.0–15.0)
MCH: 25 pg — ABNORMAL LOW (ref 26.0–34.0)
MCHC: 30.5 g/dL (ref 30.0–36.0)
MCV: 81.8 fL (ref 80.0–100.0)
Platelets: 287 10*3/uL (ref 150–400)
RBC: 3.8 MIL/uL — ABNORMAL LOW (ref 3.87–5.11)
RDW: 17.6 % — ABNORMAL HIGH (ref 11.5–15.5)
WBC: 9.1 10*3/uL (ref 4.0–10.5)
nRBC: 0 % (ref 0.0–0.2)

## 2020-09-10 LAB — TYPE AND SCREEN
ABO/RH(D): A POS
Antibody Screen: NEGATIVE
Unit division: 0
Unit division: 0

## 2020-09-10 LAB — PREPARE RBC (CROSSMATCH)

## 2020-09-10 LAB — BPAM RBC
Blood Product Expiration Date: 202203262359
Blood Product Expiration Date: 202203262359
ISSUE DATE / TIME: 202203020602
Unit Type and Rh: 6200
Unit Type and Rh: 6200

## 2020-09-10 NOTE — Progress Notes (Signed)
PROGRESS NOTE  Courtney Mack HYQ:[id number] DOB: 1956/02/13 DOA: 09/06/2020 PCP: Gladstone Lighter, MD  Brief History   71yow PMH recent DVT, PE s/p embolectomy and IVC filter placement discharged on Xarelto presented to ED w/ vaginal bleeding. U/s worrisome for endometrial carcinoma. Admitted for bleeding on anticoagulation, GYN eval. seen by GYN, had friable cervix on exam, could not definitely access the endometrium.  Concern for cervical or endometrial carcinoma.  Specimen sent, pathology pending.  Hemoglobin improved status post transfusion, continues to have some blood, appears to be minor.  No significant blood loss identified at this point.  Hemoglobin remains stable.  Continue to monitor.  A & P  Vaginal bleeding acute on chronic intermittent exacerbated by Xarelto w/ u/s concerning for endometrial carcinoma w/ associated acute blood loss anemia --Very minimal bleeding in urine purwick --Hgb remains stable, will continue Xarelto and follow CBC --continue Aygestin indefinitely per GYN --await pathology, will refer to see GYN oncology next week  PMH Massive bilateral PE s/p embolectomy and thrombolysis and placement IVC, bilateral LE DVT 08/24/20 --continue Xarelto given recent PE, DVT and use now of Aygestin.  DM type 2, A1c 10.1 2/13 --CBG remains stable, will hold metformin as inpt --will continue SSI  Essential HTN --stable, continue Losartan  Morbid obesity Body mass index is 52.25 kg/m. --as per dietician    Disposition Plan:  Discussion: Hemoglobin remained stable, continue anticoagulation, CBC in a.m.  Anticipate transfer to SNF 3/7.  Status is: Inpatient  Remains inpatient appropriate because:Inpatient level of care appropriate due to severity of illness   Dispo: The patient is from: SNF              Anticipated d/c is to: SNF              Patient currently is not medically stable to d/c.   Difficult to place patient No  DVT prophylaxis: SCDs Start:  09/06/20 9528   Code Status: Full Code Level of care: Med-Surg Family Communication:    Murray Hodgkins, MD  [facility] Direct contact: see [email or web address] (further directions at bottom of note if needed) 7PM-7AM contact night coverage as at bottom of note 09/10/2020, 1:54 PM  LOS: 4 days   Significant Hospital Events   .    Consults:    GYN   Procedures:  . 1 unit PRBC 3/2 . 3/2 bedside cervical/endometrial biopsy  Significant Diagnostic Tests:  Marland Kitchen    Micro Data:  .    Antimicrobials:  .   Interval History/Subjective  CC: f/u bleeding  Feels ok today  Objective   Vitals:  Vitals:   09/10/20 0403 09/10/20 1130  BP: (!) 116/59 120/66  Pulse: 95 83  Resp: 20 18  Temp: 98 F (36.7 C) 98.2 F (36.8 C)  SpO2: 93% 92%    Exam: Constitutional:   . Appears calm and comfortable ENMT:  . grossly normal hearing  Respiratory:  . CTA bilaterally, no w/r/r.  . Respiratory effort normal. Cardiovascular:  . RRR, no m/r/g . No significnat LE extremity edema   Psychiatric:  . Mental status o Mood, affect appropriate  Urine in container very light pink I have personally reviewed the following:   Today's Data  . Hgb up to 9.5  Scheduled Meds: . sodium chloride   Intravenous Once  . losartan  25 mg Oral Daily  . mometasone-formoterol  2 puff Inhalation BID  . multivitamin with minerals  1 tablet Oral Daily  . norethindrone  5 mg Oral  TID  . nystatin   Topical BID  . Ensure Max Protein  11 oz Oral BID  . rivaroxaban  20 mg Oral Q supper  . rosuvastatin  10 mg Oral q1800   Continuous Infusions:   Principal Problem:   Vaginal bleeding, abnormal Active Problems:   Pulmonary embolism (HCC)   Morbidly obese (HCC)   Acute blood loss anemia   DVT (deep venous thrombosis) (HCC)   LOS: 4 days   How to contact the [facility] Attending or Consulting provider [place] or covering provider during after hours [place], for this patient?  1. Check the care team in [facility]  and look for a) attending/consulting TRH provider listed and b) the [facility] team listed 2. Log into [email or web address] and use [place]'s universal password to access. If you do not have the password, please contact the hospital operator. 3. Locate the [facility] provider you are looking for under [facility] and page to a number that you can be directly reached. 4. If you still have difficulty reaching the provider, please page the [facility] (Director on Call) for the Hospitalists listed on amion for assistance.

## 2020-09-10 NOTE — Plan of Care (Signed)
Continuing with plan of care. 

## 2020-09-11 DIAGNOSIS — R102 Pelvic and perineal pain: Secondary | ICD-10-CM | POA: Diagnosis present

## 2020-09-11 DIAGNOSIS — I2782 Chronic pulmonary embolism: Secondary | ICD-10-CM | POA: Diagnosis not present

## 2020-09-11 DIAGNOSIS — C541 Malignant neoplasm of endometrium: Secondary | ICD-10-CM | POA: Diagnosis not present

## 2020-09-11 DIAGNOSIS — J9601 Acute respiratory failure with hypoxia: Secondary | ICD-10-CM | POA: Diagnosis not present

## 2020-09-11 DIAGNOSIS — Z7984 Long term (current) use of oral hypoglycemic drugs: Secondary | ICD-10-CM | POA: Diagnosis not present

## 2020-09-11 DIAGNOSIS — D500 Iron deficiency anemia secondary to blood loss (chronic): Secondary | ICD-10-CM | POA: Diagnosis not present

## 2020-09-11 DIAGNOSIS — E7800 Pure hypercholesterolemia, unspecified: Secondary | ICD-10-CM | POA: Diagnosis not present

## 2020-09-11 DIAGNOSIS — I825Y3 Chronic embolism and thrombosis of unspecified deep veins of proximal lower extremity, bilateral: Secondary | ICD-10-CM | POA: Diagnosis not present

## 2020-09-11 DIAGNOSIS — R1084 Generalized abdominal pain: Secondary | ICD-10-CM | POA: Diagnosis not present

## 2020-09-11 DIAGNOSIS — E785 Hyperlipidemia, unspecified: Secondary | ICD-10-CM | POA: Diagnosis not present

## 2020-09-11 DIAGNOSIS — E118 Type 2 diabetes mellitus with unspecified complications: Secondary | ICD-10-CM | POA: Diagnosis not present

## 2020-09-11 DIAGNOSIS — N920 Excessive and frequent menstruation with regular cycle: Secondary | ICD-10-CM | POA: Diagnosis not present

## 2020-09-11 DIAGNOSIS — Z87891 Personal history of nicotine dependence: Secondary | ICD-10-CM | POA: Diagnosis not present

## 2020-09-11 DIAGNOSIS — Z7901 Long term (current) use of anticoagulants: Secondary | ICD-10-CM | POA: Diagnosis not present

## 2020-09-11 DIAGNOSIS — R279 Unspecified lack of coordination: Secondary | ICD-10-CM | POA: Diagnosis not present

## 2020-09-11 DIAGNOSIS — E119 Type 2 diabetes mellitus without complications: Secondary | ICD-10-CM | POA: Diagnosis not present

## 2020-09-11 DIAGNOSIS — D62 Acute posthemorrhagic anemia: Secondary | ICD-10-CM | POA: Diagnosis not present

## 2020-09-11 DIAGNOSIS — R0902 Hypoxemia: Secondary | ICD-10-CM | POA: Diagnosis not present

## 2020-09-11 DIAGNOSIS — Z48812 Encounter for surgical aftercare following surgery on the circulatory system: Secondary | ICD-10-CM | POA: Diagnosis not present

## 2020-09-11 DIAGNOSIS — Z86718 Personal history of other venous thrombosis and embolism: Secondary | ICD-10-CM | POA: Diagnosis not present

## 2020-09-11 DIAGNOSIS — Z7401 Bed confinement status: Secondary | ICD-10-CM | POA: Diagnosis not present

## 2020-09-11 DIAGNOSIS — Z743 Need for continuous supervision: Secondary | ICD-10-CM | POA: Diagnosis not present

## 2020-09-11 DIAGNOSIS — R5381 Other malaise: Secondary | ICD-10-CM | POA: Diagnosis not present

## 2020-09-11 DIAGNOSIS — Z6841 Body Mass Index (BMI) 40.0 and over, adult: Secondary | ICD-10-CM | POA: Diagnosis not present

## 2020-09-11 DIAGNOSIS — R1030 Lower abdominal pain, unspecified: Secondary | ICD-10-CM | POA: Diagnosis not present

## 2020-09-11 DIAGNOSIS — Z510 Encounter for antineoplastic radiation therapy: Secondary | ICD-10-CM | POA: Diagnosis not present

## 2020-09-11 DIAGNOSIS — Z95828 Presence of other vascular implants and grafts: Secondary | ICD-10-CM | POA: Diagnosis not present

## 2020-09-11 DIAGNOSIS — I700 Atherosclerosis of aorta: Secondary | ICD-10-CM | POA: Diagnosis not present

## 2020-09-11 DIAGNOSIS — Z86711 Personal history of pulmonary embolism: Secondary | ICD-10-CM | POA: Diagnosis not present

## 2020-09-11 DIAGNOSIS — W19XXXA Unspecified fall, initial encounter: Secondary | ICD-10-CM | POA: Diagnosis not present

## 2020-09-11 DIAGNOSIS — I2699 Other pulmonary embolism without acute cor pulmonale: Secondary | ICD-10-CM | POA: Diagnosis not present

## 2020-09-11 DIAGNOSIS — I10 Essential (primary) hypertension: Secondary | ICD-10-CM | POA: Diagnosis not present

## 2020-09-11 DIAGNOSIS — M2550 Pain in unspecified joint: Secondary | ICD-10-CM | POA: Diagnosis not present

## 2020-09-11 DIAGNOSIS — I2602 Saddle embolus of pulmonary artery with acute cor pulmonale: Secondary | ICD-10-CM | POA: Diagnosis not present

## 2020-09-11 DIAGNOSIS — R7401 Elevation of levels of liver transaminase levels: Secondary | ICD-10-CM | POA: Diagnosis not present

## 2020-09-11 DIAGNOSIS — I82402 Acute embolism and thrombosis of unspecified deep veins of left lower extremity: Secondary | ICD-10-CM | POA: Diagnosis not present

## 2020-09-11 DIAGNOSIS — R58 Hemorrhage, not elsewhere classified: Secondary | ICD-10-CM | POA: Diagnosis not present

## 2020-09-11 DIAGNOSIS — I959 Hypotension, unspecified: Secondary | ICD-10-CM | POA: Diagnosis not present

## 2020-09-11 DIAGNOSIS — Z79899 Other long term (current) drug therapy: Secondary | ICD-10-CM | POA: Diagnosis not present

## 2020-09-11 DIAGNOSIS — K5900 Constipation, unspecified: Secondary | ICD-10-CM | POA: Diagnosis not present

## 2020-09-11 DIAGNOSIS — N939 Abnormal uterine and vaginal bleeding, unspecified: Secondary | ICD-10-CM | POA: Diagnosis not present

## 2020-09-11 LAB — CBC
HCT: 30.5 % — ABNORMAL LOW (ref 36.0–46.0)
Hemoglobin: 9.1 g/dL — ABNORMAL LOW (ref 12.0–15.0)
MCH: 24.9 pg — ABNORMAL LOW (ref 26.0–34.0)
MCHC: 29.8 g/dL — ABNORMAL LOW (ref 30.0–36.0)
MCV: 83.6 fL (ref 80.0–100.0)
Platelets: 270 10*3/uL (ref 150–400)
RBC: 3.65 MIL/uL — ABNORMAL LOW (ref 3.87–5.11)
RDW: 17.4 % — ABNORMAL HIGH (ref 11.5–15.5)
WBC: 8 10*3/uL (ref 4.0–10.5)
nRBC: 0 % (ref 0.0–0.2)

## 2020-09-11 LAB — RESP PANEL BY RT-PCR (FLU A&B, COVID) ARPGX2
Influenza A by PCR: NEGATIVE
Influenza B by PCR: NEGATIVE
SARS Coronavirus 2 by RT PCR: NEGATIVE

## 2020-09-11 MED ORDER — ENSURE MAX PROTEIN PO LIQD: 11.0000 [oz_av] | Freq: Two times a day (BID) | ORAL | Status: DC

## 2020-09-11 MED ORDER — RIVAROXABAN 20 MG PO TABS: 20.0000 mg | ORAL_TABLET | Freq: Every day | ORAL | Status: DC

## 2020-09-11 MED ORDER — NORETHINDRONE ACETATE 5 MG PO TABS: 5.0000 mg | ORAL_TABLET | Freq: Three times a day (TID) | ORAL | Status: DC

## 2020-09-11 MED ORDER — ADULT MULTIVITAMIN W/MINERALS CH: 1.0000 | ORAL_TABLET | Freq: Every day | ORAL | Status: DC

## 2020-09-11 NOTE — Care Management Important Message (Signed)
Important Message  Patient Details  Name: Courtney Mack MRN: [id number] Date of Birth: 1955-07-28   Medicare Important Message Given:  Yes     [name] 09/11/2020, 12:16 PM

## 2020-09-11 NOTE — Progress Notes (Addendum)
Pt will be discharging to [facility]. Report given to Levada Dy, receiving nurse. Pt offers no concerns at this time. Discharge paperwork completed.   [id number] EMS here to transport pt.

## 2020-09-11 NOTE — Discharge Summary (Signed)
Physician Discharge Summary  Courtney Mack:[id number] DOB: 1956/05/25 DOA: 09/06/2020  PCP: Gladstone Lighter, MD  Admit date: 09/06/2020 Discharge date: 09/11/2020  Recommendations for Outpatient Follow-up:   Vaginal bleeding acute on chronic intermittent exacerbated by Xarelto w/ u/s concerning for endometrial carcinoma w/ associated acute blood loss anemia --Responded well to Aygestin which will continue indefinitely per GYN.  Hemoglobin remained stable.  Follow-up with GYN oncology this Wednesday. --Pathology pending.    Contact information for follow-up providers    Gillis Ends, MD Follow up on 09/13/2020.   Specialties: Obstetrics, Gynecologic Oncology Why: 2:15 PM Contact information: [facility] [place] [address] [phone]        Gladstone Lighter, MD. Schedule an appointment as soon as possible for a visit in 3 week(s).   Specialty: Internal Medicine Contact information: [place] [address] [phone]            Contact information for after-discharge care    [facility] .   Service: Skilled Nursing Contact information: [place] [phone]                   Discharge Diagnoses: Principal diagnosis is #1 Principal Problem:   Vaginal bleeding, abnormal Active Problems:   Pulmonary embolism (HCC)   Morbidly obese (HCC)   Acute blood loss anemia   DVT (deep venous thrombosis) ([place])   Discharge Condition: improved Disposition: return to SNF  Diet recommendation:  Diet Orders (From admission, onward)    Start     Ordered   09/11/20 0000  Diet - low sodium heart healthy        09/11/20 1020   09/06/20 1802  Diet Carb Modified Fluid consistency: Thin; Room service appropriate? Yes  Diet effective now       Question Answer Comment  Diet-HS  Snack? Nothing   Calorie Level Medium 1600-2000   Fluid consistency: Thin   Room service appropriate? Yes      09/06/20 1801           Filed Weights   09/05/20 2048  Weight: 113.4 kg    HPI/Hospital Course:   11yow PMH recent DVT, PE s/p embolectomy and IVC filter placement discharged on Xarelto presented to ED w/ vaginal bleeding. U/s worrisome for endometrial carcinoma. Admitted for bleeding on anticoagulation, GYN eval. seen by GYN, had friable cervix on exam, could not definitely access the endometrium.  Concern for cervical or endometrial carcinoma.  Specimen sent, pathology, pending.  Hemoglobin improved status post transfusion, bleeding appears to have resolved on Aygestin per GYN which will be continued on discharge.  No significant blood loss identified at this point.  Hemoglobin remains stable.  Return to SNF, continue anticoagulation, follow-up pathology results, has outpatient appointment arranged for this Wednesday with GYN oncology.  A & P  Vaginal bleeding acute on chronic intermittent exacerbated by Xarelto w/ u/s concerning for endometrial carcinoma w/ associated acute blood loss anemia --Responded well to Aygestin which will continue indefinitely per GYN.  Hemoglobin remained stable.  Follow-up with GYN oncology this Wednesday. --Pathology pending.  PMH Massive bilateral PE s/p embolectomy and thrombolysis and placement IVC, bilateral LE DVT 08/24/20 --continue Xarelto given recent PE, DVT and use now of Aygestin.  DM type 2, A1c 10.1 2/13 --CBG stable.  Resume Metformin on discharge.  Essential HTN --stable, continue Losartan  Morbid obesity Body mass index is 52.25 kg/m. --as per dietician  Consults:    GYN  Procedures:   1 unit PRBC 3/2  3/2 bedside cervical/endometrial biopsy   Today's assessment: S: CC: f/u bleeding  Feels well, no complaints Eating well No pain  O: Vitals:  Vitals:   09/11/20 0459 09/11/20 0821  BP: 140/69 (!)  108/94  Pulse: 96 95  Resp: (!) 22   Temp: 97.8 F (36.6 C) 98.1 F (36.7 C)  SpO2: 93% 93%    Constitutional:  . Appears calm and comfortable ENMT:  . grossly normal hearing  Respiratory:  . CTA bilaterally, no w/r/r.  . Respiratory effort normal Cardiovascular:  . RRR, no m/r/g . No LE extremity edema   Psychiatric:  . judgement and insight appear normal . Mental status o Mood, affect appropriate  Urine appears normal   Discharge Instructions  Discharge Instructions    Diet - low sodium heart healthy   Complete by: As directed      Allergies as of 09/11/2020   No Known Allergies     Medication List    STOP taking these medications   Rivaroxaban Stater Pack (15 mg and 20 mg) Commonly known as: XARELTO STARTER PACK Replaced by: rivaroxaban 20 MG Tabs tablet     TAKE these medications   Ensure Max Protein Liqd Take 330 mLs (11 oz total) by mouth 2 (two) times daily.   Fluticasone-Salmeterol 100-50 MCG/DOSE Aepb Commonly known as: ADVAIR Inhale 1 puff into the lungs 2 (two) times daily. Rinse mouth after each use   losartan 25 MG tablet Commonly known as: COZAAR Take 1 tablet (25 mg total) by mouth daily.   metFORMIN 500 MG tablet Commonly known as: GLUCOPHAGE Take 1 tablet (500 mg total) by mouth 2 (two) times daily with a meal.   multivitamin with minerals Tabs tablet Take 1 tablet by mouth daily. Start taking on: September 12, 2020   norethindrone 5 MG tablet Commonly known as: AYGESTIN Take 1 tablet (5 mg total) by mouth 3 (three) times daily.   nystatin powder Commonly known as: MYCOSTATIN/NYSTOP Apply topically 2 (two) times daily.   ondansetron 4 MG tablet Commonly known as: ZOFRAN Take 1 tablet (4 mg total) by mouth every 6 (six) hours as needed for nausea.   rivaroxaban 20 MG Tabs tablet Commonly known as: XARELTO Take 1 tablet (20 mg total) by mouth daily with supper. Replaces: Rivaroxaban Stater Pack (15 mg and 20 mg)   rosuvastatin  10 MG tablet Commonly known as: CRESTOR Take 1 tablet (10 mg total) by mouth daily.      No Known Allergies  The results of significant diagnostics from this hospitalization (including imaging, microbiology, ancillary and laboratory) are listed below for reference.    Significant Diagnostic Studies: US PELVIC COMPLETE WITH TRANSVAGINAL  Result Date: 09/06/2020 CLINICAL DATA:  Postmenopausal bleeding EXAM: TRANSABDOMINAL AND TRANSVAGINAL ULTRASOUND OF PELVIS TECHNIQUE: Both transabdominal and transvaginal ultrasound examinations of the pelvis were performed. Transabdominal technique was performed for global imaging of the pelvis including uterus, ovaries, adnexal regions, and pelvic cul-de-sac. It was necessary to proceed with endovaginal exam following the transabdominal exam to visualize the ovaries. COMPARISON:  None FINDINGS: Uterus Measurements: 10.4 x 5.2 x 5.9 cm = volume: 166 mL. No fibroids or other mass visualized. Endometrium Thickness: 29. The endometrium is diffusely heterogeneous and thickened with increased vascularity. Right ovary Not visualized Left ovary Not visualized Other findings No abnormal free fluid. IMPRESSION: 1. Diffusely thickened  and heterogeneous appearance of the endometrium is concerning for endometrial carcinoma. Gynecologic follow-up is recommended. 2. Neither ovary was visualized. Electronically Signed   By: Constance Holster M.D.   On: 09/06/2020 01:28    Microbiology: Recent Results (from the past 240 hour(s))  Resp Panel by RT-PCR (Flu A&B, Covid) Nasopharyngeal Swab     Status: None   Collection Time: 09/06/20  2:39 AM   Specimen: Nasopharyngeal Swab; Nasopharyngeal(NP) swabs in vial transport medium  Result Value Ref Range Status   SARS Coronavirus 2 by RT PCR NEGATIVE NEGATIVE Final    Comment: (NOTE) SARS-CoV-2 target nucleic acids are NOT DETECTED.  The SARS-CoV-2 RNA is generally detectable in upper respiratory specimens during the acute phase of  infection. The lowest concentration of SARS-CoV-2 viral copies this assay can detect is 138 copies/mL. A negative result does not preclude SARS-Cov-2 infection and should not be used as the sole basis for treatment or other patient management decisions. A negative result may occur with  improper specimen collection/handling, submission of specimen other than nasopharyngeal swab, presence of viral mutation(s) within the areas targeted by this assay, and inadequate number of viral copies(<138 copies/mL). A negative result must be combined with clinical observations, patient history, and epidemiological information. The expected result is Negative.  Fact Sheet for Patients:  EntrepreneurPulse.com.au  Fact Sheet for Healthcare Providers:  IncredibleEmployment.be  This test is no t yet approved or cleared by the [place] FDA and  has been authorized for detection and/or diagnosis of SARS-CoV-2 by FDA under an Emergency Use Authorization (EUA). This EUA will remain  in effect (meaning this test can be used) for the duration of the COVID-19 declaration under Section 564(b)(1) of the Act, 21 U.S.C.section 360bbb-3(b)(1), unless the authorization is terminated  or revoked sooner.       Influenza A by PCR NEGATIVE NEGATIVE Final   Influenza B by PCR NEGATIVE NEGATIVE Final    Comment: (NOTE) The Xpert Xpress SARS-CoV-2/FLU/RSV plus assay is intended as an aid in the diagnosis of influenza from Nasopharyngeal swab specimens and should not be used as a sole basis for treatment. Nasal washings and aspirates are unacceptable for Xpert Xpress SARS-CoV-2/FLU/RSV testing.  Fact Sheet for Patients: EntrepreneurPulse.com.au  Fact Sheet for Healthcare Providers: IncredibleEmployment.be  This test is not yet approved or cleared by the [place] FDA and has been authorized for detection and/or diagnosis of SARS-CoV-2  by FDA under an Emergency Use Authorization (EUA). This EUA will remain in effect (meaning this test can be used) for the duration of the COVID-19 declaration under Section 564(b)(1) of the Act, 21 U.S.C. section 360bbb-3(b)(1), unless the authorization is terminated or revoked.  Performed at [facility] [address], [address] [place], [place] [address]   MRSA PCR Screening     Status: None   Collection Time: 09/06/20  6:20 PM   Specimen: Nasopharyngeal  Result Value Ref Range Status   MRSA by PCR NEGATIVE NEGATIVE Final    Comment:        The GeneXpert MRSA Assay (FDA approved for NASAL specimens only), is one component of a comprehensive MRSA colonization surveillance program. It is not intended to diagnose MRSA infection nor to guide or monitor treatment for MRSA infections. Performed at [facility], [address] [place], [place] [address]      Labs: Basic Metabolic Panel: Recent Labs  Lab 09/05/20 2119 09/06/20 0415 09/07/20 0404  NA 134* 137 137  K 4.0 3.9 4.0  CL 102 106  106  CO2 26 25 25   GLUCOSE 108* 108* 106*  BUN 11 12 12   CREATININE 0.69 0.67 0.60  CALCIUM 9.0 8.7* 8.5*   Liver Function Tests: Recent Labs  Lab 09/05/20 2119  AST 20  ALT 10  ALKPHOS 56  BILITOT 1.4*  PROT 7.5  ALBUMIN 3.5   CBC: Recent Labs  Lab 09/07/20 1850 09/08/20 0424 09/09/20 0401 09/10/20 0419 09/11/20 0404  WBC 8.1 7.8 8.1 9.1 8.0  HGB 9.0* 8.6* 9.0* 9.5* 9.1*  HCT 28.5* 28.4* 30.3* 31.1* 30.5*  MCV 81.2 82.3 83.5 81.8 83.6  PLT 242 253 267 287 270    Principal Problem:   Vaginal bleeding, abnormal Active Problems:   Pulmonary embolism (HCC)   Morbidly obese (HCC)   Acute blood loss anemia   DVT (deep venous thrombosis) ([place])   Time coordinating discharge: 25 minutes  Signed:  Murray Hodgkins, MD  [facility]  09/11/2020, 10:20 AM

## 2020-09-11 NOTE — TOC Transition Note (Addendum)
Transition of Care [facility]) - CM/SW Discharge Note   Patient Details  Name: Courtney Mack MRN: [id number] Date of Birth: 06/17/1956  Transition of Care [facility]) CM/SW Contact:  Beverly Sessions, RN Phone Number: 09/11/2020, 10:55 AM   Clinical Narrative:     Patient to discharge back to [facility] today Daughter notified  DC info sent in the [place].  Bedside RN to call report EMS packet on the chart  Covid test pending.  After covid results received EMS transport will be arranged  Notified Magda Paganini at [facility] that patient is on O2   115pm - EMS transport called.  3rd on the list.  Bedside RN notified   Final next level of care: Skilled Nursing Facility Barriers to Discharge: No Barriers Identified   Patient Goals and CMS Choice        Discharge Placement              Patient chooses bed at: [facility] Patient to be transferred to facility by: [facility] Name of family member notified: Daughter Patient and family notified of of transfer: 09/11/20  Discharge Plan and Services     Post Acute Care Choice: [employer]                               Social Determinants of Health (SDOH) Interventions     Readmission Risk Interventions No flowsheet data found.

## 2020-09-11 NOTE — Progress Notes (Signed)
SATURATION QUALIFICATIONS: (This note is used to comply with regulatory documentation for home oxygen)   Patient Saturations on Room Air at Rest = 88%  Patient Saturations on Room Air while Ambulating = 84%  Patient Saturations on 2 Liters of oxygen while Ambulating = 93%  Please briefly explain why patient needs home oxygen: Patient drops to the low 80's on RA just to the side of the bed.

## 2020-09-11 NOTE — TOC Progression Note (Signed)
Transition of Care [facility]) - Progression Note    Patient Details  Name: Courtney Mack MRN: [id number] Date of Birth: 04-Jan-1956  Transition [facility] Care [facility]) CM/SW Contact  Beverly Sessions, RN Phone Number: 09/11/2020, 9:17 AM  Clinical Narrative:    Per Magda Paganini at [facility] she has authorization for patient to return.  Will need repeat covid test prior to discharge.  MD notified    Expected Discharge Plan: [place] Barriers to Discharge: Continued Medical Work up  Expected Discharge Plan and Services Expected Discharge Plan: [place] Choice: [place] arrangements for the past 2 months: [place]                                       Social Determinants of Health (SDOH) Interventions    Readmission Risk Interventions No flowsheet data found.

## 2020-09-12 DIAGNOSIS — N920 Excessive and frequent menstruation with regular cycle: Secondary | ICD-10-CM | POA: Diagnosis not present

## 2020-09-12 DIAGNOSIS — I2699 Other pulmonary embolism without acute cor pulmonale: Secondary | ICD-10-CM | POA: Diagnosis not present

## 2020-09-12 DIAGNOSIS — E118 Type 2 diabetes mellitus with unspecified complications: Secondary | ICD-10-CM | POA: Diagnosis not present

## 2020-09-12 LAB — SURGICAL PATHOLOGY

## 2020-09-13 ENCOUNTER — Inpatient Hospital Stay: Payer: Medicare HMO

## 2020-09-13 ENCOUNTER — Telehealth: Payer: Self-pay | Admitting: Obstetrics and Gynecology

## 2020-09-13 ENCOUNTER — Encounter: Payer: Self-pay | Admitting: Obstetrics and Gynecology

## 2020-09-13 ENCOUNTER — Inpatient Hospital Stay: Payer: Medicare HMO | Attending: Obstetrics and Gynecology | Admitting: Obstetrics and Gynecology

## 2020-09-13 VITALS — BP 107/53 | HR 86 | Temp 98.0°F | Resp 20

## 2020-09-13 DIAGNOSIS — I2699 Other pulmonary embolism without acute cor pulmonale: Secondary | ICD-10-CM | POA: Insufficient documentation

## 2020-09-13 DIAGNOSIS — R7401 Elevation of levels of liver transaminase levels: Secondary | ICD-10-CM | POA: Insufficient documentation

## 2020-09-13 DIAGNOSIS — Z86718 Personal history of other venous thrombosis and embolism: Secondary | ICD-10-CM | POA: Diagnosis not present

## 2020-09-13 DIAGNOSIS — N939 Abnormal uterine and vaginal bleeding, unspecified: Secondary | ICD-10-CM

## 2020-09-13 DIAGNOSIS — Z7901 Long term (current) use of anticoagulants: Secondary | ICD-10-CM | POA: Diagnosis not present

## 2020-09-13 DIAGNOSIS — I82402 Acute embolism and thrombosis of unspecified deep veins of left lower extremity: Secondary | ICD-10-CM | POA: Insufficient documentation

## 2020-09-13 DIAGNOSIS — I700 Atherosclerosis of aorta: Secondary | ICD-10-CM | POA: Insufficient documentation

## 2020-09-13 DIAGNOSIS — Z6841 Body Mass Index (BMI) 40.0 and over, adult: Secondary | ICD-10-CM | POA: Insufficient documentation

## 2020-09-13 DIAGNOSIS — E7800 Pure hypercholesterolemia, unspecified: Secondary | ICD-10-CM | POA: Insufficient documentation

## 2020-09-13 DIAGNOSIS — E119 Type 2 diabetes mellitus without complications: Secondary | ICD-10-CM | POA: Diagnosis not present

## 2020-09-13 DIAGNOSIS — Z87891 Personal history of nicotine dependence: Secondary | ICD-10-CM | POA: Insufficient documentation

## 2020-09-13 DIAGNOSIS — D500 Iron deficiency anemia secondary to blood loss (chronic): Secondary | ICD-10-CM | POA: Insufficient documentation

## 2020-09-13 DIAGNOSIS — Z86711 Personal history of pulmonary embolism: Secondary | ICD-10-CM | POA: Insufficient documentation

## 2020-09-13 DIAGNOSIS — E785 Hyperlipidemia, unspecified: Secondary | ICD-10-CM | POA: Diagnosis not present

## 2020-09-13 DIAGNOSIS — C541 Malignant neoplasm of endometrium: Secondary | ICD-10-CM | POA: Diagnosis not present

## 2020-09-13 DIAGNOSIS — D62 Acute posthemorrhagic anemia: Secondary | ICD-10-CM

## 2020-09-13 DIAGNOSIS — Z7984 Long term (current) use of oral hypoglycemic drugs: Secondary | ICD-10-CM | POA: Insufficient documentation

## 2020-09-13 DIAGNOSIS — Z79899 Other long term (current) drug therapy: Secondary | ICD-10-CM | POA: Insufficient documentation

## 2020-09-13 DIAGNOSIS — I10 Essential (primary) hypertension: Secondary | ICD-10-CM | POA: Insufficient documentation

## 2020-09-13 NOTE — Patient Instructions (Signed)
Uterine Cancer  Uterine cancer is an abnormal growth of cancer tissue in the womb (uterus). Uterine cancer can spread to other parts of the body. Uterine cancer usually occurs after a woman stops having menstrual periods (menopause). However, it may also occur around the time that menopause starts. The wall of the uterus has an inner layer of tissue called the endometrium and an outer layer of muscle tissue called the myometrium. The most common type of uterine cancer is endometrial cancer, and it starts in the endometrium. Cancer that starts in the myometrium (uterine sarcoma) is very rare. What are the causes? The exact cause of this condition is not known. What increases the risk? You are more likely to develop this condition if you:  Are older than 50.  Have a thicker than normal endometrium (endometrial hyperplasia).  Have never been pregnant or cannot have children.  Started menstruating when you were younger than 66 years old or are older than 69 and having menstrual periods.  Have a history of cancer, including: ? Cancer of the ovaries, or cancer of the intestines, colon, or rectum (colorectal cancer). ? A family history of uterine cancer or hereditary nonpolyposis colon cancer.  Have a long-term (chronic) condition, such as: ? Diabetes. ? High blood pressure. ? Thyroid disease. ? Gallbladder disease. ? Obesity. ? A personal history of enlarged ovaries with small cysts (polycystic ovarian syndrome).  Have been exposed to: ? Radiation. ? Cigarette smoke. ? Hormone therapy or a medicine called tamoxifen. What are the signs or symptoms? Symptoms of this condition include:  Abnormal vaginal bleeding or discharge. Bleeding may start as a watery, blood-streaked flow that gradually contains more blood. This is the most common symptom. If you experience abnormal vaginal bleeding, do not assume that it is part of menopause.  Vaginal bleeding after menopause.  Unexplained weight  loss.  Bleeding between periods.  Urination that is difficult, painful, or more frequent than usual.  A lump in the vagina.  Pain, bloating, fullness in the abdomen, or pelvic pain. You may also have pain during sex. How is this diagnosed? This condition may be diagnosed based on your medical history, symptoms, a physical exam, and a pelvic exam. During the pelvic exam, your health care provider will feel your pelvis for any growths, as well as for any enlarged lymph nodes. You may also have tests, including:  Blood and urine tests.  Imaging tests, such as X-rays, CT scans, ultrasound, or MRIs.  Hysteroscopy. This involves inserting a thin, flexible tube with a light and camera on the end through the vagina to look inside the uterus.  A Pap test to check for abnormal cells in the lower part of the uterus (cervix) and the upper vagina.  Removal of a tissue sample (biopsy) from the uterine lining. The sample will be examined in a lab to check for cancer cells.  Dilation and curettage (D&C). This procedure involves stretching the cervix and scraping the lining of the uterus to get a tissue sample to check for cancer cells. The cancer is staged to determine its severity and extent. Staging is an assessment of the size of the tumor and if, or where, the cancer has spread. The stages of uterine cancer are:  Stage I. The cancer is only in the uterus.  Stage II. The cancer has spread to the cervix.  Stage III. The cancer has spread outside the uterus, but not outside the pelvis. The cancer may have spread to the lymph nodes in  the pelvis. Lymph nodes are part of your body's disease-fighting (immune) system.  Stage IV. The cancer has spread to other parts of the body, such as the bladder, rectum, and other organs. How is this treated? This condition is often treated with surgery to remove:  The uterus, cervix, fallopian tubes, and ovaries (total hysterectomy).  The uterus and cervix  (simple hysterectomy). The type of hysterectomy you will have depends on the extent of your cancer. Lymph nodes near the uterus may also be removed. Treatment may include one or more of the following:  Chemotherapy. These are medicines to kill cancer cells or to slow their growth. Chemotherapy also kills other normal, healthy cells, including blood-forming cells in the bone marrow, hair follicles, and cells in the mouth, digestive tract, and reproductive system.  Radiation therapy. This uses high-energy rays to kill cancer cells and prevent their spread.  Chemoradiation. This treatment alternates chemotherapy with radiation treatments to enhance the effects of radiation.  Brachytherapy. This involves placing small radioactive capsules inside the body where the cancer was removed.  Hormone therapy. This includes taking medicines that lower the levels of estrogen in the body.  Immunotherapy. These medicines help your immune system fight cancer.  Targeted therapy. These medicines specifically kill cancer cells. Follow these instructions at home: Medicines  Take over-the-counter and prescription medicines only as told by your health care provider.  Ask your health care provider if the medicine prescribed to you requires you to avoid driving or using machinery. Activity  Return to your normal activities as told by your health care provider. Ask your health care provider what activities are safe for you.  Get regular exercise. Aim for 30 minutes of moderate-intensity activity 5 times a week. Examples of moderate-intensity activity include walking and yoga. Be sure to talk with your health care provider before starting any exercise routine. Lifestyle  Eat a healthy diet. A healthy diet includes a lot of fruits and vegetables, low-fat dairy products, lean meats, and fiber.  Do not use any products that contain nicotine or tobacco. These products include cigarettes, chewing tobacco, and vaping  devices, such as e-cigarettes. If you need help quitting, ask your health care provider.  Consider joining a support group to help you cope with stress. Your health care provider may be able to recommend a local or online support group. General instructions  Drink enough fluid to keep your urine pale yellow.  Do not have sex, douche, or place anything in your vagina, such as a tampon or diaphragm, until your health care provider says that it is safe.  Work with your health care provider to: ? Manage any chronic conditions you have. ? Manage any side effects of your treatment.  Keep all follow-up visits. This is important. Where to find more information  American Cancer Society: www.cancer.[place]: www.cancer.gov Contact a health care provider if:  You have pain in your pelvis or abdomen that gets worse.  You cannot urinate.  You have abnormal vaginal bleeding.  You have a fever. Get help right away if:  You develop sudden or new severe symptoms, such as: ? Heavy bleeding. ? Severe weakness. ? Pain that is severe or does not get better with medicine. Summary  Uterine cancer is an abnormal growth of cancer tissue in the uterus. The most common type of uterine cancer starts in the endometrium and is called endometrial cancer.  This condition is often treated with surgery to remove the uterus, cervix, fallopian tubes,  and ovaries (total hysterectomy) or the uterus and cervix (simple hysterectomy).  Work with your health care provider to manage any long-term (chronic) conditions you have, such as diabetes, high blood pressure, thyroid disease, or gallbladder disease.  Consider joining a support group to help you cope with stress. Your health care provider may be able to recommend a local or online support group. This information is not intended to replace advice given to you by your health care provider. Make sure you discuss any questions you have with  your health care provider. Document Revised: 02/08/2020 Document Reviewed: 02/08/2020 Elsevier Patient Education  2021 [place]. Cervical Cancer  Cervical cancer is abnormal growth of cells in the cervix. The cervix is the opening and bottom part of the uterus. It is between the vagina and the uterus. There are three main types of cervical cancer:  Squamous cell carcinoma. This cancer starts in the cells that line the surface of the cervix.  Adenocarcinoma. This cancer starts in the gland cells that line the cervix.  Cervical sarcoma. This is a rare cervical tumor that grows and spreads very fast. What are the causes? Most cervical cancers are caused by a virus called human papillomavirus (HPV). What increases the risk? This condition is more likely to develop in women who have a certain sexual history, family or genetic factors, or other medical conditions. Sexual history  You have a sexually transmitted infection (STI). These include: ? Chlamydia. ? Herpes. ? HPV.  You have more than one sexual partner, or you are having sex with someone who has more than one sexual partner.  You do not use condoms correctly every time you have sex.  You were sexually active before the age of 81. Family or genetic factors  Your mother took diethylstilbestrol (DES) during pregnancy.  Your mother or sister has had cervical cancer. Other medical conditions  You are between the ages of 32-50.  You have had cancer of the vagina or vulva.  You use birth control pills.  You smoke or regularly breathe in second hand smoke.  You have a weakened immune system.  You have a history of dysplasia of the cervix. What are the signs or symptoms? There are no symptoms during the early stages of cervical cancer. Once the cancer is in the cervix and spreads to nearby tissues, symptoms may include:  Abnormal vaginal discharge, bleeding or a period that is longer or heavier than usual.  Vaginal  bleeding after sex, douching, or a Pap test.  Vaginal bleeding after menopause.  Pelvic discomfort or pain during sex.  An abnormal Pap test.  Being very tired. How is this diagnosed? This condition is diagnosed based on your medical history and a physical exam, which includes a pelvic exam and Pap test. Your health care provider may also do:  A colposcopy. A microscope is used to closely check the cells of the cervix, vagina, and vulva.  A cervical biopsy. Small samples of tissue from the cervix are removed and checked under a microscope.  A cone biopsy. Tissue with cancer cells is removed and tested.  Imaging tests, including ultrasound, CT scan, MRI, or PET scan. If cervical cancer is found, it will be staged. Staging assesses tumor size and if and where the cancer has spread. How is this treated? Treatment for this condition depends on the cancer stage and may include:  Cone biopsy to remove the cancerous tissue.  Removing the entire uterus and cervix.  Removing the uterus, cervix, upper  vagina, lymph nodes, and nearby tissue. The ovaries may be left or removed.  Medicines to treat cancer. These include chemotherapy or targeted therapy.  A combination of surgery, radiation, and chemotherapy.  Biological therapy. These are substances that strengthen your immune system's fight against cancer or infection. They may be used with chemotherapy. Follow these instructions at home: Medicines  Take over-the-counter and prescription medicines only as told by your health care provider. General instructions  Do not use any products that contain nicotine or tobacco, such as cigarettes, e-cigarettes, and chewing tobacco. If you need help quitting, ask your health care provider.  Do not have sex until your health care provider says it is safe.  Use a condom correctly every time you have sex.  Consider joining a support group with others who have a diagnosis of cervical  cancer.  Keep all follow-up visits as told by your health care provider. This is important. Where to find more information  [place]: www.cancer.gov  American Cancer Society: www.cancer.org  [employer]: www.acog.org Contact a health care provider if:  You have pelvic pain or pressure.  You have leg or back pain.  You have a fever.  You have abnormal vaginal bleeding or discharge.  You lose weight.  You develop a cough. Get help right away if:  You cannot urinate.  You have blood in your urine.  You have blood in your stool.  You develop severe back, stomach, or pelvic pain. Summary  Cervical cancer is abnormal growth of cells in the cervix.  Most cervical cancers are caused by a virus called human papillomavirus (HPV).  Treatment for this condition depends on the stage of the cancer. Treatment may include a combination of surgery, radiation, and chemotherapy.  Getting vaccinated with the HPV vaccine can prevent most cervical cancers. This information is not intended to replace advice given to you by your health care provider. Make sure you discuss any questions you have with your health care provider. Document Revised: 06/25/2019 Document Reviewed: 06/25/2019 Elsevier Patient Education  2021 [employer].

## 2020-09-13 NOTE — Progress Notes (Signed)
Gynecologic Oncology Consult Visit   Referring Provider: Gladstone Lighter, MD [place],  [place] [address] [phone]   Chief Concern: cervical/endometrial cancer  Subjective:  Courtney Mack is a 65 y.o. G2P2 female who is seen in consultation from Dr. Tressia Miners for cervical/endometrial cancer.  She was diagnosed with recent DVT, PE s/p embolectomy and IVC filter placement discharged on Xarelto presented to ED w/ vaginal bleeding. Pelvic U/s worrisome for endometrial carcinoma. Admitted for bleeding on anticoagulation. She received PRBC x 1 unit. Gyn Oncology consult was obtained.   Details regarding her evaluation:   09/06/2020 Pelvic US FINDINGS: Uterus: Measurements: 10.4 x 5.2 x 5.9 cm = volume: 166 mL. No fibroids or other mass visualized. Endometrium Thickness: 29. The endometrium is diffusely heterogeneous and thickened with increased vascularity. Right and left ovaries: Not visualized.  No abnormal free fluid.  IMPRESSION: 1. Diffusely thickened and heterogeneous appearance of the endometrium is concerning for endometrial carcinoma. Gynecologic follow-up is recommended. 2. Neither ovary was visualized.  Dr. Amalia Hailey was consulted for gynecologic evaluation. On speculum examination malodorous friable necrotic appearing cervix was seen. Cervical biopsy performed. An endometrial biopsy was attempted but the cervical os was not well visualized. Tissue in the [place] was sent for evaluation.  09/06/2020 Cervical and endometrial biopsies A. UTERINE CERVIX; BIOPSY:   ENDOMETRIOID ENDOMETRIAL ADENOCARCINOMA, FAVOR LOW GRADE (FIGO GRADE 1-2).   B. ENDOMETRIUM; BIOPSY: - ENDOMETRIOID ENDOMETRIAL ADENOCARCINOMA, FAVOR LOW GRADE (FIGO GRADE 1-2).   She presents for evaluation. She continues to have bleeding.     Problem List: Patient Active Problem List   Diagnosis Date Noted  . Endometrial cancer ([place]) 09/13/2020  . Acute blood loss anemia 09/06/2020  .  Vaginal bleeding 09/06/2020  . DVT (deep venous thrombosis) ([place]) 09/06/2020  . Anemia, unspecified 08/24/2020  . Morbidly obese ([place]) 08/23/2020  . Essential hypertension 08/23/2020  . New onset type 2 diabetes mellitus ([place]) 08/23/2020  . Dyslipidemia 08/23/2020  . Acute respiratory failure with hypoxia ([place])   . Pulmonary embolism ([place]) 08/18/2020  . Vaginal bleeding, abnormal 08/18/2020  . Obesity, morbid, BMI 50 or higher ([place]) 08/18/2020    Past Medical History: Past Medical History:  Diagnosis Date  . DVT (deep venous thrombosis) ([place]) 09/06/2020  . Hx of blood clots     Past Surgical History: Past Surgical History:  Procedure Laterality Date  . IVC FILTER INSERTION N/A 08/18/2020   Procedure: IVC FILTER INSERTION;  Surgeon: Katha Cabal, MD;  Location: [facility];  Service: Cardiovascular;  Laterality: N/A;  . PULMONARY THROMBECTOMY N/A 08/18/2020   Procedure: PULMONARY THROMBECTOMY;  Surgeon: Katha Cabal, MD;  Location: [facility];  Service: Cardiovascular;  Laterality: N/A;  . TUBAL LIGATION      Past Gynecologic History:  Menarche: 9 Menstrual details: Lasted for 5 days Last Menstrual Period: unknown  OB History:  OB History  Gravida Para Term Preterm AB Living  2 2       2   SAB IAB Ectopic Multiple Live Births               # Outcome Date GA Lbr Len/2nd Weight Sex Delivery Anes PTL Lv  2 Para           1 Para             Family History: History reviewed. No pertinent family history.  Social History: Social History   Socioeconomic History  . Marital status: Single    Spouse name:  Not on file  . Number of children: Not on file  . Years of education: Not on file  . Highest education level: Not on file  Occupational History  . Not on file  Tobacco Use  . Smoking status: Former Research scientist (life sciences)  . Smokeless tobacco: Never Used  Substance and Sexual Activity  . Alcohol use: Not Currently  . Drug use: Not Currently  . Sexual  activity: Not on file  Other Topics Concern  . Not on file  Social History Narrative  . Not on file   Social Determinants of Health   Financial Resource Strain: Not on file  Food Insecurity: Not on file  Transportation Needs: Not on file  Physical Activity: Not on file  Stress: Not on file  Social Connections: Not on file  Intimate Partner Violence: Not on file    Allergies: No Known Allergies  Current Medications: Current Outpatient Medications  Medication Sig Dispense Refill  . Ensure Max Protein (ENSURE MAX PROTEIN) LIQD Take 330 mLs (11 oz total) by mouth 2 (two) times daily.    . Fluticasone-Salmeterol (ADVAIR) 100-50 MCG/DOSE AEPB Inhale 1 puff into the lungs 2 (two) times daily. Rinse mouth after each use    . losartan (COZAAR) 25 MG tablet Take 1 tablet (25 mg total) by mouth daily. 30 tablet 0  . metFORMIN (GLUCOPHAGE) 500 MG tablet Take 1 tablet (500 mg total) by mouth 2 (two) times daily with a meal. 60 tablet 0  . Multiple Vitamin (MULTIVITAMIN WITH MINERALS) TABS tablet Take 1 tablet by mouth daily.    . norethindrone (AYGESTIN) 5 MG tablet Take 1 tablet (5 mg total) by mouth 3 (three) times daily.    Marland Kitchen nystatin (MYCOSTATIN/NYSTOP) powder Apply topically 2 (two) times daily. 15 g 0  . ondansetron (ZOFRAN) 4 MG tablet Take 1 tablet (4 mg total) by mouth every 6 (six) hours as needed for nausea. 20 tablet 0  . rivaroxaban (XARELTO) 20 MG TABS tablet Take 1 tablet (20 mg total) by mouth daily with supper.    . rosuvastatin (CRESTOR) 10 MG tablet Take 1 tablet (10 mg total) by mouth daily. 30 tablet 0   No current facility-administered medications for this visit.    Review of Systems General: night sweats o/w negative for fevers, changes in weight  Skin: negative for changes in moles or sores or rash Eyes: visual issues  HEENT: negative for change in hearing, tinnitus, voice changes Pulmonary:  Dyspnea, cough, wheezing o/w negative for orthopnea,  productive Cardiac: negative for palpitations, pain Gastrointestinal: negative for nausea, vomiting, constipation, diarrhea, hematemesis, hematochezia Genitourinary/Sexual: negative for dysuria, retention, hematuria, incontinence Ob/Gyn:  negative for abnormal bleeding, or pain Musculoskeletal :knee pain o/w negative for other  pain, joint pain, back pain Hematology:  easy bruising, abnormal bleeding Neurologic/Psych: negative for headaches, seizures, paralysis, weakness, numbness  Objective:  Physical Examination:  BP (!) 107/53   Pulse 86   Temp 98 F (36.7 C)   Resp 20   SpO2 100%    ECOG Performance Status: 4 - Bedbound  General appearance: morbidly obese and pale arrived in a wheel chair HEENT:  PERRL, neck supple with midline trachea. Thyroid without masses.  NODES:  No cervical, supraclavicular, axillary, or inguinal lymphadenopathy palpated.  LUNGS:  Clear to auscultation bilaterally.   HEART:  Regular rate and rhythm.  ABDOMEN:  Soft, nontender, nondistended but obese. No masses or ascites but limited exam.  MSK:  Difficulty moving on the bed and positioning for exam EXTREMITIES:  2-3+ peripheral edema.   NEURO:  Nonfocal. Well oriented.  Appropriate affect.  Pelvic: EGBUS: no lesions Cervix: gross central enlarged mass 3 cm. Os not visible.  Vagina: mild bleeding; no lesions involving the vagina but challenging exam Uterus: unable to palpate due to habitus Adnexa:  unable to palpate due to habitus Limited assessment of the paramatria and rectovaginal deferred. Very challenging exam as patient unable to bend her legs or put her feet in stirrups.   Lab Review Labs on site today: Lab Results  Component Value Date   WBC 8.0 09/11/2020   HGB 9.1 (L) 09/11/2020   HCT 30.5 (L) 09/11/2020   MCV 83.6 09/11/2020   PLT 270 09/11/2020     Chemistry      Component Value Date/Time   NA 137 09/07/2020 0404   K 4.0 09/07/2020 0404   CL 106 09/07/2020 0404   CO2 25  09/07/2020 0404   BUN 12 09/07/2020 0404   CREATININE 0.60 09/07/2020 0404      Component Value Date/Time   CALCIUM 8.5 (L) 09/07/2020 0404   ALKPHOS 56 09/05/2020 2119   AST 20 09/05/2020 2119   ALT 10 09/05/2020 2119   BILITOT 1.4 (H) 09/05/2020 2119       Radiologic Imaging: 08/18/2020 CXR   FINDINGS: The heart size and mediastinal contours are within normal limits. Both lungs are clear. The visualized skeletal structures are unremarkable. Mild elevation of the right hemidiaphragm.  IMPRESSION: 1. No acute cardiopulmonary disease.  08/18/2020  Chest CT IMPRESSION: 1. Extensive bilateral pulmonary emboli as detailed above. Positive for acute PE with CTevidence of right heart strain (RV/LV Ratio = 1.6) consistent with at least submassive (intermediate risk) PE. The presence of right heart strain has been associated with an increased risk of morbidity and mortality. Critical Value/emergent results were called by telephone at the time of interpretation on 08/18/2020 at 3:11 pm to provider [facility] , who verbally acknowledged these results. 2. No other acute abnormality. 3. Mild aortic atherosclerosis.   LEFT LOWER EXTREMITY VENOUS DOPPLER ULTRASOUND IMPRESSION: Findings consistent with nonocclusive DVT within the LEFT popliteal, LEFT posterior tibial and LEFT peroneal veins.  RIGHT LOWER EXTREMITY VENOUS DOPPLER ULTRASOUND   IMPRESSION: No evidence for DVT involving the right lower extremity. Please see prior left lower extremity report for further details of the left lower extremity. Assessment:  Courtney Mack is a 64 y.o. female diagnosed with either stage II endometrial cancer with cervical involvement or cervical cancer.   Poor performance status.   Medical co-morbidities complicating care: HTN, diabetes and obesity. Massive PE with DVT currently on anti-coagulation.   Plan:   Problem List Items Addressed This Visit      Genitourinary   Endometrial  cancer ([place]) - Primary   Relevant Orders   NM PET Image Initial (PI) Skull Base To Thigh   Ambulatory referral to Radiation Oncology   Ambulatory referral to Oncology     Other   Acute blood loss anemia   Morbidly obese (HCC)   Vaginal bleeding      We reviewed her diagnosis. Recommendation for PET/CT, Radiation Oncology and Medical Oncology consults. She is not a surgical candidate. The patient's diagnosis, an outline of the further diagnostic and laboratory studies which will be required, the recommendation, and alternatives were discussed.  All questions were answered to the patient's satisfaction.  Follow up in three months. Anticipate she will have completed treatment at that point.   A total of 70 minutes were  spent with the patient/family today; >50% was spent in education, counseling and coordination of care for endometrial cancer vs cervical cancer.    Dreonna Hussein Gaetana Michaelis, MD    CC:  Gladstone Lighter, MD [place],  [place] [address] [phone]

## 2020-09-13 NOTE — Telephone Encounter (Signed)
I called Museum/gallery curator at [facility]. She was concerned about her patient Courtney Mack appt. It was cancelled and she was involved with the cancellation and feels the lady needs to be seen ASAP. I forwarded message to [employer] and she will call her back.

## 2020-09-16 ENCOUNTER — Encounter: Payer: Self-pay | Admitting: Emergency Medicine

## 2020-09-16 ENCOUNTER — Emergency Department
Admission: EM | Admit: 2020-09-16 | Discharge: 2020-09-16 | Disposition: A | Discharge status: Home / Self Care | Payer: Medicare HMO | Attending: Emergency Medicine | Admitting: Emergency Medicine

## 2020-09-16 ENCOUNTER — Other Ambulatory Visit: Payer: Self-pay

## 2020-09-16 DIAGNOSIS — I10 Essential (primary) hypertension: Secondary | ICD-10-CM | POA: Diagnosis not present

## 2020-09-16 DIAGNOSIS — Z87891 Personal history of nicotine dependence: Secondary | ICD-10-CM | POA: Insufficient documentation

## 2020-09-16 DIAGNOSIS — Z7901 Long term (current) use of anticoagulants: Secondary | ICD-10-CM | POA: Diagnosis not present

## 2020-09-16 DIAGNOSIS — Z86718 Personal history of other venous thrombosis and embolism: Secondary | ICD-10-CM | POA: Diagnosis not present

## 2020-09-16 DIAGNOSIS — Z79899 Other long term (current) drug therapy: Secondary | ICD-10-CM | POA: Diagnosis not present

## 2020-09-16 DIAGNOSIS — Z7984 Long term (current) use of oral hypoglycemic drugs: Secondary | ICD-10-CM | POA: Insufficient documentation

## 2020-09-16 DIAGNOSIS — N939 Abnormal uterine and vaginal bleeding, unspecified: Secondary | ICD-10-CM | POA: Diagnosis not present

## 2020-09-16 DIAGNOSIS — R102 Pelvic and perineal pain: Secondary | ICD-10-CM | POA: Diagnosis present

## 2020-09-16 DIAGNOSIS — E119 Type 2 diabetes mellitus without complications: Secondary | ICD-10-CM | POA: Insufficient documentation

## 2020-09-16 LAB — CBC
HCT: 32.1 % — ABNORMAL LOW (ref 36.0–46.0)
Hemoglobin: 9.7 g/dL — ABNORMAL LOW (ref 12.0–15.0)
MCH: 24.4 pg — ABNORMAL LOW (ref 26.0–34.0)
MCHC: 30.2 g/dL (ref 30.0–36.0)
MCV: 80.7 fL (ref 80.0–100.0)
Platelets: 324 10*3/uL (ref 150–400)
RBC: 3.98 MIL/uL (ref 3.87–5.11)
RDW: 18 % — ABNORMAL HIGH (ref 11.5–15.5)
WBC: 7.4 10*3/uL (ref 4.0–10.5)
nRBC: 0 % (ref 0.0–0.2)

## 2020-09-16 LAB — BASIC METABOLIC PANEL
Anion gap: 8 (ref 5–15)
BUN: 9 mg/dL (ref 8–23)
CO2: 25 mmol/L (ref 22–32)
Calcium: 9.1 mg/dL (ref 8.9–10.3)
Chloride: 104 mmol/L (ref 98–111)
Creatinine, Ser: 0.79 mg/dL (ref 0.44–1.00)
GFR, Estimated: >60 mL/min (ref >60)
Glucose, Bld: 142 mg/dL — ABNORMAL HIGH (ref 70–99)
Potassium: 4.2 mmol/L (ref 3.5–5.1)
Sodium: 137 mmol/L (ref 135–145)

## 2020-09-16 LAB — SAMPLE TO BLOOD BANK

## 2020-09-16 MED ORDER — ACETAMINOPHEN 500 MG PO TABS
1000.0000 mg | ORAL_TABLET | Freq: Once | ORAL | Status: AC
  Administered 2020-09-16: 1000 mg via ORAL
  Filled 2020-09-16: qty 2

## 2020-09-16 NOTE — ED Triage Notes (Signed)
Pt in via [facility] from [facility]; reports ongoing vaginal bleeding since 2/11, currently on medication to prevent the bleeding.  Reports bleeding had stopped and started back yesterday.  Recently dx w/ Ovarian Cancer.    A/Ox4, denies any other complaints, vitals WDL, NAD noted at this time.

## 2020-09-16 NOTE — ED Notes (Signed)
Report to [facility]. OK for EMS transport.

## 2020-09-16 NOTE — Discharge Instructions (Signed)
As we discussed, your hemoglobin/blood counts are actually better than previous.  Continue all of your typical medications, including her blood thinners and the Aygestin.   Ensure that she follows up with her multiple appointments this week with oncology, Gyn/Onc, and radiation oncology.  If she has any severely worsening bleeding, dizziness and passing out, worsening abdominal pain, please return to the ED.

## 2020-09-16 NOTE — ED Notes (Signed)
Pt cleansed and given new brief and sheets from vaginal bleeding.

## 2020-09-16 NOTE — ED Triage Notes (Signed)
Pt in via EMS from [facility] with c/o vaginal bleeding. Pt was seen here on the 7th for the same. Pt with a new cancer diagnosis. Pt has been taking meds to stop the bleeding. Pt had a papsmear and then the bleeding started again. A & O x's 4, able to stand and pivot, pt on 2L of oxygen all the time

## 2020-09-16 NOTE — ED Provider Notes (Signed)
[facility] Emergency Department Provider Note ____________________________________________   Event Date/Time   First MD Initiated Contact with Patient 09/16/20 1125     (approximate)  I have reviewed the triage vital signs and the nursing notes.  HISTORY  Chief Complaint Vaginal Bleeding   HPI Courtney Mack is a 65 y.o. femalewho presents to the ED for evaluation of vaginal bleeding.   Chart review indicates history DVT on Xarelto, HTN, DM. Recent admission from 3/2-3/7 for vaginal bleeding causing acute blood loss anemia.  Started on Aygestin per GYN.  Work-up concerning for endometrial malignancy.  Followed up as an outpatient on 3/9 with GYN.  Since starting her Aygestin, patient reports decreasing vaginal bleeding and is now more intermittent than persistent.  She reports being without bleeding for couple days, then had bleeding restart for the past 2 days.  He denies any other vaginal discharge beyond the bleeding.  Denies diarrhea, dysuria or hematuria.  She reports tolerating p.o. and toileting at her baseline.  Reports intermittent abdominal discomfort, for which she does not like to take medications.  Currently reporting 3/10 intensity pelvic pain.  She denies any dizziness, syncope, chest pain, fever.  Past Medical History:  Diagnosis Date  . DVT (deep venous thrombosis) ([place]) 09/06/2020  . Hx of blood clots     Patient Active Problem List   Diagnosis Date Noted  . Endometrial cancer ([name]) 09/13/2020  . Acute blood loss anemia 09/06/2020  . Vaginal bleeding 09/06/2020  . DVT (deep venous thrombosis) ([place]) 09/06/2020  . Anemia, unspecified 08/24/2020  . Morbidly obese ([place]) 08/23/2020  . Essential hypertension 08/23/2020  . New onset type 2 diabetes mellitus ([place]) 08/23/2020  . Dyslipidemia 08/23/2020  . Acute respiratory failure with hypoxia ([place])   . Pulmonary embolism ([place]) 08/18/2020  . Vaginal bleeding, abnormal 08/18/2020  .  Obesity, morbid, BMI 50 or higher ([place]) 08/18/2020    Past Surgical History:  Procedure Laterality Date  . IVC FILTER INSERTION N/A 08/18/2020   Procedure: IVC FILTER INSERTION;  Surgeon: Katha Cabal, MD;  Location: [facility];  Service: Cardiovascular;  Laterality: N/A;  . PULMONARY THROMBECTOMY N/A 08/18/2020   Procedure: PULMONARY THROMBECTOMY;  Surgeon: Katha Cabal, MD;  Location: [place] [facility];  Service: Cardiovascular;  Laterality: N/A;  . TUBAL LIGATION      Prior to Admission medications   Medication Sig Start Date End Date Taking? Authorizing Provider  Ensure Max Protein (ENSURE MAX PROTEIN) LIQD Take 330 mLs (11 oz total) by mouth 2 (two) times daily. 09/11/20   [name], MD  Fluticasone-Salmeterol (ADVAIR) 100-50 MCG/DOSE AEPB Inhale 1 puff into the lungs 2 (two) times daily. Rinse mouth after each use    [provider]  losartan (COZAAR) 25 MG tablet Take 1 tablet (25 mg total) by mouth daily. 08/25/20   [name], MD  metFORMIN (GLUCOPHAGE) 500 MG tablet Take 1 tablet (500 mg total) by mouth 2 (two) times daily with a meal. 08/24/20   [name], MD  Multiple Vitamin (MULTIVITAMIN WITH MINERALS) TABS tablet Take 1 tablet by mouth daily. 09/12/20   [name], MD  norethindrone (AYGESTIN) 5 MG tablet Take 1 tablet (5 mg total) by mouth 3 (three) times daily. 09/11/20   [name], MD  nystatin (MYCOSTATIN/NYSTOP) powder Apply topically 2 (two) times daily. 08/24/20   [name], MD  ondansetron (ZOFRAN) 4 MG tablet Take 1 tablet (4 mg total) by  mouth every 6 (six) hours as needed for nausea. 08/24/20   [name], MD  rivaroxaban (XARELTO) 20 MG TABS tablet Take 1 tablet (20 mg total) by mouth daily with supper. 09/11/20   [name], MD  rosuvastatin (CRESTOR) 10 MG tablet Take 1 tablet (10 mg total) by mouth daily. 08/25/20   [name], MD    Allergies Patient has no known allergies.  No  family history on file.  Social History Social History   Tobacco Use  . Smoking status: Former Research scientist (life sciences)  . Smokeless tobacco: Never Used  Substance Use Topics  . Alcohol use: Not Currently  . Drug use: Not Currently    Review of Systems  Constitutional: No fever/chills Eyes: No visual changes. ENT: No sore throat. Cardiovascular: Denies chest pain. Respiratory: Denies shortness of breath. Gastrointestinal:  No nausea, no vomiting.  No diarrhea.  No constipation. Positive for lower abdominal pain. Genitourinary: Negative for dysuria.  Positive for vaginal bleeding. Musculoskeletal: Negative for back pain. Skin: Negative for rash. Neurological: Negative for headaches, focal weakness or numbness.   ____________________________________________   PHYSICAL EXAM:  VITAL SIGNS: Vitals:   09/16/20 0912  BP: (!) 121/51  Pulse: 84  Resp: 19  Temp: 98.3 F (36.8 C)  SpO2: 96%     Constitutional: Alert and oriented. Well appearing and in no acute distress. Eyes: Conjunctivae are normal. PERRL. EOMI. Head: Atraumatic. Nose: No congestion/rhinnorhea. Mouth/Throat: Mucous membranes are moist.  Oropharynx non-erythematous. Neck: No stridor. No cervical spine tenderness to palpation. Cardiovascular: Normal rate, regular rhythm. Grossly normal heart sounds.  Good peripheral circulation. Respiratory: Normal respiratory effort.  No retractions. Lungs CTAB. Gastrointestinal: Soft , nondistended. No CVA tenderness. Mild and diffuse lower quadrant tenderness without peritoneal features.  Upper abdomen is benign. Musculoskeletal: No lower extremity tenderness nor edema.  No joint effusions. No signs of acute trauma. Neurologic:  Normal speech and language. No gross focal neurologic deficits are appreciated. No gait instability noted. Skin:  Skin is warm, dry and intact. No rash noted. Psychiatric: Mood and affect are normal. Speech and behavior are  normal.  ____________________________________________   LABS (all labs ordered are listed, but only abnormal results are displayed)  Labs Reviewed  CBC - Abnormal; Notable for the following components:      Result Value   Hemoglobin 9.7 (*)    HCT 32.1 (*)    MCH 24.4 (*)    RDW 18.0 (*)    All other components within normal limits  BASIC METABOLIC PANEL - Abnormal; Notable for the following components:   Glucose, Bld 142 (*)    All other components within normal limits  SAMPLE TO BLOOD BANK    ____________________________________________  RADIOLOGY  ED MD interpretation: Recent pelvic ultrasound reviewed with endometrial thickening  Official radiology report(s): No results found.  ____________________________________________   PROCEDURES and INTERVENTIONS  Procedure(s) performed (including Critical Care):  .1-3 Lead EKG Interpretation Performed by: Vladimir Crofts, MD Authorized by: Vladimir Crofts, MD     Interpretation: normal     ECG rate:  80   ECG rate assessment: normal     Rhythm: sinus rhythm     Ectopy: none     Conduction: normal      Medications  acetaminophen (TYLENOL) tablet 1,000 mg (has no administration in time range)    ____________________________________________   MDM / ED COURSE   65 year old woman on anticoagulation presents to the ED with continued vaginal bleeding, likely due to endometrial pathology, and amenable  to continued outpatient management.  Normal vitals on room air.  Exam demonstrates a well-appearing patient who has some lower abdominal discomfort, but certainly not a peritoneal abdomen.  She otherwise looks well without distress.  No tachycardia, dizziness or syncope to suggest acute blood loss anemia.  Her hemoglobin is actually improved from her recent discharge, up to 9.7.  Her symptoms are likely related to her continued endometrial pathology, without evidence of additional pathology.  She describes no GI symptoms such as  diarrhea or hematochezia, and denies urinary symptoms such as dysuria or hematuria.  Blood work shows no evidence of additional pathology.  Considering she is well connected as an outpatient and appropriate work-up is being performed, I see no barriers to continued outpatient management.  We discussed return precautions for the ED prior to discharge.      ____________________________________________   FINAL CLINICAL IMPRESSION(S) / ED DIAGNOSES  Final diagnoses:  Vaginal bleeding     ED Discharge Orders    None       Dylan Smith   Note:  This document was prepared using Dragon voice recognition software and may include unintentional dictation errors.   Vladimir Crofts, MD 09/16/20 1200

## 2020-09-20 ENCOUNTER — Other Ambulatory Visit: Payer: Self-pay

## 2020-09-20 ENCOUNTER — Emergency Department
Admission: EM | Admit: 2020-09-20 | Discharge: 2020-09-20 | Disposition: A | Discharge status: Home / Self Care | Payer: Medicare HMO | Attending: Emergency Medicine | Admitting: Emergency Medicine

## 2020-09-20 DIAGNOSIS — Z7984 Long term (current) use of oral hypoglycemic drugs: Secondary | ICD-10-CM | POA: Insufficient documentation

## 2020-09-20 DIAGNOSIS — I10 Essential (primary) hypertension: Secondary | ICD-10-CM | POA: Diagnosis not present

## 2020-09-20 DIAGNOSIS — E119 Type 2 diabetes mellitus without complications: Secondary | ICD-10-CM | POA: Insufficient documentation

## 2020-09-20 DIAGNOSIS — R1030 Lower abdominal pain, unspecified: Secondary | ICD-10-CM | POA: Diagnosis not present

## 2020-09-20 DIAGNOSIS — Z87891 Personal history of nicotine dependence: Secondary | ICD-10-CM | POA: Insufficient documentation

## 2020-09-20 DIAGNOSIS — N939 Abnormal uterine and vaginal bleeding, unspecified: Secondary | ICD-10-CM

## 2020-09-20 LAB — CBC WITH DIFFERENTIAL/PLATELET
Abs Immature Granulocytes: 0.03 10*3/uL (ref 0.00–0.07)
Basophils Absolute: 0.1 10*3/uL (ref 0.0–0.1)
Basophils Relative: 1 %
Eosinophils Absolute: 0.2 10*3/uL (ref 0.0–0.5)
Eosinophils Relative: 2 %
HCT: 29.3 % — ABNORMAL LOW (ref 36.0–46.0)
Hemoglobin: 8.8 g/dL — ABNORMAL LOW (ref 12.0–15.0)
Immature Granulocytes: 0 %
Lymphocytes Relative: 23 %
Lymphs Abs: 2.3 10*3/uL (ref 0.7–4.0)
MCH: 24.2 pg — ABNORMAL LOW (ref 26.0–34.0)
MCHC: 30 g/dL (ref 30.0–36.0)
MCV: 80.5 fL (ref 80.0–100.0)
Monocytes Absolute: 0.6 10*3/uL (ref 0.1–1.0)
Monocytes Relative: 6 %
Neutro Abs: 6.6 10*3/uL (ref 1.7–7.7)
Neutrophils Relative %: 68 %
Platelets: 394 10*3/uL (ref 150–400)
RBC: 3.64 MIL/uL — ABNORMAL LOW (ref 3.87–5.11)
RDW: 18 % — ABNORMAL HIGH (ref 11.5–15.5)
WBC: 9.8 10*3/uL (ref 4.0–10.5)
nRBC: 0 % (ref 0.0–0.2)

## 2020-09-20 LAB — BASIC METABOLIC PANEL
Anion gap: 8 (ref 5–15)
BUN: 9 mg/dL (ref 8–23)
CO2: 23 mmol/L (ref 22–32)
Calcium: 8.5 mg/dL — ABNORMAL LOW (ref 8.9–10.3)
Chloride: 103 mmol/L (ref 98–111)
Creatinine, Ser: 0.7 mg/dL (ref 0.44–1.00)
GFR, Estimated: >60 mL/min (ref >60)
Glucose, Bld: 105 mg/dL — ABNORMAL HIGH (ref 70–99)
Potassium: 4.3 mmol/L (ref 3.5–5.1)
Sodium: 134 mmol/L — ABNORMAL LOW (ref 135–145)

## 2020-09-20 LAB — TYPE AND SCREEN
ABO/RH(D): A POS
Antibody Screen: NEGATIVE

## 2020-09-20 LAB — PROTIME-INR
INR: 2.6 — ABNORMAL HIGH (ref 0.8–1.2)
Prothrombin Time: 26.8 seconds — ABNORMAL HIGH (ref 11.4–15.2)

## 2020-09-20 MED ORDER — FENTANYL CITRATE (PF) 100 MCG/2ML IJ SOLN
50.0000 ug | Freq: Once | INTRAMUSCULAR | Status: AC
Start: 2020-09-20 — End: 2020-09-20
  Administered 2020-09-20: 50 ug via INTRAVENOUS
  Filled 2020-09-20: qty 2

## 2020-09-20 MED ORDER — ONDANSETRON HCL 4 MG/2ML IJ SOLN
4.0000 mg | Freq: Once | INTRAMUSCULAR | Status: AC
  Administered 2020-09-20: 4 mg via INTRAVENOUS
  Filled 2020-09-20: qty 2

## 2020-09-20 MED ORDER — SODIUM CHLORIDE 0.9 % IV BOLUS (SEPSIS)
1000.0000 mL | Freq: Once | INTRAVENOUS | Status: AC
  Administered 2020-09-20: 1000 mL via INTRAVENOUS

## 2020-09-20 MED ORDER — FENTANYL CITRATE (PF) 100 MCG/2ML IJ SOLN
50.0000 ug | Freq: Once | INTRAMUSCULAR | Status: AC
  Administered 2020-09-20: 50 ug via INTRAVENOUS
  Filled 2020-09-20: qty 2

## 2020-09-20 NOTE — ED Notes (Signed)
Patient given sandwich tray and diet sprite.

## 2020-09-20 NOTE — ED Notes (Signed)
[facility]  CALLED  FOR  TRANSPORT  TO  [facility]  

## 2020-09-20 NOTE — Discharge Instructions (Addendum)
Follow up in clinic tomorrow morning.  Return for any additional questions or concerns.

## 2020-09-20 NOTE — ED Provider Notes (Signed)
Patient received in signout from Dr.Ward.  Plan to follow-up with Gyn/onc regarding plan of care.  Confirmed by Dr. Eldred Manges that patient has follow-up tomorrow morning with gyn/onc as well as radiation oncology in clinic.  Patient is hemodynamically stable.  Appropriate for DC back to facility with close outpatient follow-up.     Merlyn Lot, MD 09/20/20 [phone]

## 2020-09-20 NOTE — ED Provider Notes (Signed)
[facility] Emergency Department Provider Note  ____________________________________________   Event Date/Time   First MD Initiated Contact with Patient 09/20/20 [phone]     (approximate)  I have reviewed the triage vital signs and the nursing notes.   HISTORY  Chief Complaint Vaginal Bleeding    HPI Courtney Mack is a 65 y.o. female with history of recent DVT, PE status post embolectomy and thrombolysis with IVC filter placement on Xarelto who was recently diagnosed with endometrial carcinoma who presents to the emergency department increased vaginal bleeding.  She was admitted to the hospital on 09/06/2020 for acute blood loss anemia.  She received 1 unit of packed red blood cells during this admission.  She was seen by Dr. Amalia Hailey with [facility] who performed her biopsy in the ED.  Her care was then referred to Dr. Theora Gianotti with [facility] gynecology oncology.  It appears she saw them in the office on 09/13/2020.  Patient states that her vaginal bleeding increased yesterday and she is now passing clots.  She denies any chest pain, shortness of breath or dizziness.  Having some increasing lower abdominal cramping.  Had nausea but no vomiting.  No fever.        Past Medical History:  Diagnosis Date  . DVT (deep venous thrombosis) ([place]) 09/06/2020  . Hx of blood clots     Patient Active Problem List   Diagnosis Date Noted  . Endometrial cancer ([place]) 09/13/2020  . Acute blood loss anemia 09/06/2020  . Vaginal bleeding 09/06/2020  . DVT (deep venous thrombosis) ([place]) 09/06/2020  . Anemia, unspecified 08/24/2020  . Morbidly obese ([place]) 08/23/2020  . Essential hypertension 08/23/2020  . New onset type 2 diabetes mellitus ([place]) 08/23/2020  . Dyslipidemia 08/23/2020  . Acute respiratory failure with hypoxia ([place])   . Pulmonary embolism ([place]) 08/18/2020  . Vaginal bleeding, abnormal 08/18/2020  . Obesity, morbid, BMI 50 or higher ([place]) 08/18/2020    Past  Surgical History:  Procedure Laterality Date  . IVC FILTER INSERTION N/A 08/18/2020   Procedure: IVC FILTER INSERTION;  Surgeon: Katha Cabal, MD;  Location: [place] [facility];  Service: Cardiovascular;  Laterality: N/A;  . PULMONARY THROMBECTOMY N/A 08/18/2020   Procedure: PULMONARY THROMBECTOMY;  Surgeon: Katha Cabal, MD;  Location: [place] [facility];  Service: Cardiovascular;  Laterality: N/A;  . TUBAL LIGATION      Prior to Admission medications   Medication Sig Start Date End Date Taking? Authorizing Provider  Ensure Max Protein (ENSURE MAX PROTEIN) LIQD Take 330 mLs (11 oz total) by mouth 2 (two) times daily. 09/11/20   [name], MD  Fluticasone-Salmeterol (ADVAIR) 100-50 MCG/DOSE AEPB Inhale 1 puff into the lungs 2 (two) times daily. Rinse mouth after each use    [provider]  losartan (COZAAR) 25 MG tablet Take 1 tablet (25 mg total) by mouth daily. 08/25/20   [name], MD  metFORMIN (GLUCOPHAGE) 500 MG tablet Take 1 tablet (500 mg total) by mouth 2 (two) times daily with a meal. 08/24/20   [name], MD  Multiple Vitamin (MULTIVITAMIN WITH MINERALS) TABS tablet Take 1 tablet by mouth daily. 09/12/20   [name], MD  norethindrone (AYGESTIN) 5 MG tablet Take 1 tablet (5 mg total) by mouth 3 (three) times daily. 09/11/20   [name], MD  nystatin (MYCOSTATIN/NYSTOP) powder Apply topically 2 (two) times daily. 08/24/20   [name], MD  ondansetron (ZOFRAN) 4 MG tablet  Take 1 tablet (4 mg total) by mouth every 6 (six) hours as needed for nausea. 08/24/20   [name], MD  rivaroxaban (XARELTO) 20 MG TABS tablet Take 1 tablet (20 mg total) by mouth daily with supper. 09/11/20   [name], MD  rosuvastatin (CRESTOR) 10 MG tablet Take 1 tablet (10 mg total) by mouth daily. 08/25/20   [name], MD    Allergies Patient has no known allergies.  No family history on file.  Social History Social History    Tobacco Use  . Smoking status: Former Research scientist (life sciences)  . Smokeless tobacco: Never Used  Substance Use Topics  . Alcohol use: Not Currently  . Drug use: Not Currently    Review of Systems Constitutional: No fever. Eyes: No visual changes. ENT: No sore throat. Cardiovascular: Denies chest pain. Respiratory: Denies shortness of breath. Gastrointestinal: No nausea, vomiting, diarrhea. Genitourinary: Negative for dysuria. Musculoskeletal: Negative for back pain. Skin: Negative for rash. Neurological: Negative for focal weakness or numbness.  ____________________________________________   PHYSICAL EXAM:  VITAL SIGNS: ED Triage Vitals  Enc Vitals Group     BP 09/20/20 0322 (!) 110/53     Pulse Rate 09/20/20 0322 78     Resp 09/20/20 0322 16     Temp 09/20/20 0322 98.4 F (36.9 C)     Temp Source 09/20/20 0322 Oral     SpO2 09/20/20 0450 98 %     Weight 09/20/20 0320 252 lb (114.3 kg)     Height 09/20/20 0320 4\' 10"  (1.473 m)     Head Circumference --      Peak Flow --      Pain Score 09/20/20 0320 8     Pain Loc --      Pain Edu? --      Excl. in [place]? --    CONSTITUTIONAL: Alert and oriented and responds appropriately to questions. Well-appearing; well-nourished HEAD: Normocephalic EYES: Conjunctivae clear, pupils appear equal, EOM appear intact ENT: normal nose; moist mucous membranes NECK: Supple, normal ROM CARD: RRR; S1 and S2 appreciated; no murmurs, no clicks, no rubs, no gallops RESP: Normal chest excursion without splinting or tachypnea; breath sounds clear and equal bilaterally; no wheezes, no rhonchi, no rales, no hypoxia or respiratory distress, speaking full sentences ABD/GI: Normal bowel sounds; non-distended; soft, non-tender, no rebound, no guarding, no peritoneal signs, no hepatosplenomegaly GU: Exam is very limited due to patient's morbid obesity.  She does have small dark red clots coming from the vaginal introitus but no sign of active hemorrhage.  No  discharge appreciated.  Normal external genitalia. BACK: The back appears normal EXT: Normal ROM in all joints; no deformity noted, no edema; no cyanosis SKIN: Normal color for age and race; warm; no rash on exposed skin NEURO: Moves all extremities equally PSYCH: The patient's mood and manner are appropriate.  ____________________________________________   LABS (all labs ordered are listed, but only abnormal results are displayed)  Labs Reviewed  CBC WITH DIFFERENTIAL/PLATELET - Abnormal; Notable for the following components:      Result Value   RBC 3.64 (*)    Hemoglobin 8.8 (*)    HCT 29.3 (*)    MCH 24.2 (*)    RDW 18.0 (*)    All other components within normal limits  BASIC METABOLIC PANEL - Abnormal; Notable for the following components:   Sodium 134 (*)    Glucose, Bld 105 (*)    Calcium 8.5 (*)    All other  components within normal limits  PROTIME-INR - Abnormal; Notable for the following components:   Prothrombin Time 26.8 (*)    INR 2.6 (*)    All other components within normal limits  TYPE AND SCREEN  TYPE AND SCREEN   ____________________________________________  EKG  None ____________________________________________  RADIOLOGY I, Perle Brickhouse, personally viewed and evaluated these images (plain radiographs) as part of my medical decision making, as well as reviewing the written report by the radiologist.  ED MD interpretation:  none  Official radiology report(s): No results found.  ____________________________________________   PROCEDURES  Procedure(s) performed (including Critical Care):  Procedures  ____________________________________________   INITIAL IMPRESSION / ASSESSMENT AND PLAN / ED COURSE  As part of my medical decision making, I reviewed the following data within the [facility] notes reviewed and incorporated, Labs reviewed , Old chart reviewed and Notes from prior ED visits         Patient here with  increasing vaginal bleeding.  She does have some intermittent soft blood pressures but normal heart rate.  Her hemoglobin has dropped about 1 g since recent ED visit 4 days ago.  Will discuss with OB/GYN on-call.  ED PROGRESS  5:00 AM  Spoke with Dr. Amalia Hailey on call for [facility] who saw patient in ED and performed her biopsy.  He recommends that her care now be managed by gynecology oncology.  He states that [place] will be here at the [facility] later today and we should contact them for further treatment.  He feels patient needs radiation.  She is not a surgical candidate.  She is on Aygestin but she states this is not helping her bleeding currently.   7:00 AM  Patient signed out to Dr. Quentin Cornwall to discuss with [facility] onc regarding plan of care and dispo.  Patient continues to be hemodynamically stable.   I reviewed all nursing notes and pertinent previous records as available.  I have reviewed and interpreted any EKGs, lab and urine results, imaging (as available).   ____________________________________________   FINAL CLINICAL IMPRESSION(S) / ED DIAGNOSES  Final diagnoses:  Vaginal bleeding     ED Discharge Orders    None      *Please note:  Courtney Mack was evaluated in Emergency Department on 09/20/2020 for the symptoms described in the history of present illness. She was evaluated in the context of the global COVID-19 pandemic, which necessitated consideration that the patient might be at risk for infection with the SARS-CoV-2 virus that causes COVID-19. Institutional protocols and algorithms that pertain to the evaluation of patients at risk for COVID-19 are in a state of rapid change based on information released by regulatory bodies including the CDC and federal and state organizations. These policies and algorithms were followed during the patient's care in the ED.  Some ED evaluations and interventions may be delayed as a result of limited staffing during and  the pandemic.*   Note:  This document was prepared using Dragon voice recognition software and may include unintentional dictation errors.   Shanzay Hepworth, Delice Bison, DO 09/20/20 [phone]

## 2020-09-20 NOTE — ED Notes (Signed)
Patient's brief changed. Patient cleaned up.

## 2020-09-20 NOTE — ED Triage Notes (Signed)
Pt to ED via EMS from [facility], pt has hx of abnormal uterine and vaginal bleeding according to paper work sent from facility, per ems staff stated pt has been passing more clots than usual. Pt states she has inc lower abd pain, that started yesterday. Pt had 1 episode emesis, po zofran given. Pt on 2l chronically.

## 2020-09-20 NOTE — ED Notes (Signed)
Patient's brief changed. Blood noted in brief. New chuck pad placed under patient.

## 2020-09-21 ENCOUNTER — Inpatient Hospital Stay: Payer: Medicare HMO

## 2020-09-21 ENCOUNTER — Encounter: Payer: Self-pay | Admitting: Oncology

## 2020-09-21 ENCOUNTER — Ambulatory Visit
Admission: RE | Admit: 2020-09-21 | Discharge: 2020-09-21 | Disposition: A | Discharge status: Home / Self Care | Payer: Medicare HMO | Source: Ambulatory Visit | Attending: Radiation Oncology | Admitting: Radiation Oncology

## 2020-09-21 ENCOUNTER — Inpatient Hospital Stay (HOSPITAL_BASED_OUTPATIENT_CLINIC_OR_DEPARTMENT_OTHER): Payer: Medicare HMO | Admitting: Oncology

## 2020-09-21 VITALS — BP 113/45 | HR 88 | Temp 97.5°F | Resp 18 | Wt 252.0 lb

## 2020-09-21 DIAGNOSIS — Z510 Encounter for antineoplastic radiation therapy: Secondary | ICD-10-CM | POA: Diagnosis not present

## 2020-09-21 DIAGNOSIS — Z86718 Personal history of other venous thrombosis and embolism: Secondary | ICD-10-CM | POA: Diagnosis not present

## 2020-09-21 DIAGNOSIS — Z6841 Body Mass Index (BMI) 40.0 and over, adult: Secondary | ICD-10-CM | POA: Diagnosis not present

## 2020-09-21 DIAGNOSIS — K5900 Constipation, unspecified: Secondary | ICD-10-CM | POA: Insufficient documentation

## 2020-09-21 DIAGNOSIS — Z7984 Long term (current) use of oral hypoglycemic drugs: Secondary | ICD-10-CM | POA: Diagnosis not present

## 2020-09-21 DIAGNOSIS — Z87891 Personal history of nicotine dependence: Secondary | ICD-10-CM | POA: Insufficient documentation

## 2020-09-21 DIAGNOSIS — D500 Iron deficiency anemia secondary to blood loss (chronic): Secondary | ICD-10-CM

## 2020-09-21 DIAGNOSIS — D509 Iron deficiency anemia, unspecified: Secondary | ICD-10-CM | POA: Insufficient documentation

## 2020-09-21 DIAGNOSIS — C541 Malignant neoplasm of endometrium: Secondary | ICD-10-CM | POA: Insufficient documentation

## 2020-09-21 DIAGNOSIS — E119 Type 2 diabetes mellitus without complications: Secondary | ICD-10-CM | POA: Diagnosis not present

## 2020-09-21 DIAGNOSIS — I82402 Acute embolism and thrombosis of unspecified deep veins of left lower extremity: Secondary | ICD-10-CM | POA: Diagnosis not present

## 2020-09-21 DIAGNOSIS — Z7901 Long term (current) use of anticoagulants: Secondary | ICD-10-CM | POA: Insufficient documentation

## 2020-09-21 DIAGNOSIS — I10 Essential (primary) hypertension: Secondary | ICD-10-CM | POA: Diagnosis not present

## 2020-09-21 DIAGNOSIS — I2699 Other pulmonary embolism without acute cor pulmonale: Secondary | ICD-10-CM | POA: Diagnosis not present

## 2020-09-21 DIAGNOSIS — R7401 Elevation of levels of liver transaminase levels: Secondary | ICD-10-CM | POA: Diagnosis not present

## 2020-09-21 DIAGNOSIS — Z79899 Other long term (current) drug therapy: Secondary | ICD-10-CM | POA: Insufficient documentation

## 2020-09-21 HISTORY — DX: Iron deficiency anemia, unspecified: D50.9

## 2020-09-21 MED ORDER — DEXAMETHASONE 4 MG PO TABS: 8.0000 mg | ORAL_TABLET | Freq: Every day | ORAL | 1 refills | Status: DC

## 2020-09-21 MED ORDER — PROCHLORPERAZINE MALEATE 10 MG PO TABS: 10.0000 mg | ORAL_TABLET | Freq: Four times a day (QID) | ORAL | 1 refills | Status: DC | PRN

## 2020-09-21 NOTE — Patient Instructions (Signed)
Cisplatin injection What is this medicine? CISPLATIN (SIS pla tin) is a chemotherapy drug. It targets fast dividing cells, like cancer cells, and causes these cells to die. This medicine is used to treat many types of cancer like bladder, ovarian, and testicular cancers. This medicine may be used for other purposes; ask your health care provider or pharmacist if you have questions. COMMON BRAND NAME(S): Platinol, Platinol -AQ What should I tell my health care provider before I take this medicine? They need to know if you have any of these conditions:  eye disease, vision problems  hearing problems  kidney disease  low blood counts, like white cells, platelets, or red blood cells  tingling of the fingers or toes, or other nerve disorder  an unusual or allergic reaction to cisplatin, carboplatin, oxaliplatin, other medicines, foods, dyes, or preservatives  pregnant or trying to get pregnant  breast-feeding How should I use this medicine? This drug is given as an infusion into a vein. It is administered in a hospital or clinic by a specially trained health care professional. Talk to your pediatrician regarding the use of this medicine in children. Special care may be needed. Overdosage: If you think you have taken too much of this medicine contact a poison control center or emergency room at once. NOTE: This medicine is only for you. Do not share this medicine with others. What if I miss a dose? It is important not to miss a dose. Call your doctor or health care professional if you are unable to keep an appointment. What may interact with this medicine? This medicine may interact with the following medications:  foscarnet  certain antibiotics like amikacin, gentamicin, neomycin, polymyxin B, streptomycin, tobramycin, vancomycin This list may not describe all possible interactions. Give your health care provider a list of all the medicines, herbs, non-prescription drugs, or dietary  supplements you use. Also tell them if you smoke, drink alcohol, or use illegal drugs. Some items may interact with your medicine. What should I watch for while using this medicine? Your condition will be monitored carefully while you are receiving this medicine. You will need important blood work done while you are taking this medicine. This drug may make you feel generally unwell. This is not uncommon, as chemotherapy can affect healthy cells as well as cancer cells. Report any side effects. Continue your course of treatment even though you feel ill unless your doctor tells you to stop. This medicine may increase your risk of getting an infection. Call your healthcare professional for advice if you get a fever, chills, or sore throat, or other symptoms of a cold or flu. Do not treat yourself. Try to avoid being around people who are sick. Avoid taking medicines that contain aspirin, acetaminophen, ibuprofen, naproxen, or ketoprofen unless instructed by your healthcare professional. These medicines may hide a fever. This medicine may increase your risk to bruise or bleed. Call your doctor or health care professional if you notice any unusual bleeding. Be careful brushing and flossing your teeth or using a toothpick because you may get an infection or bleed more easily. If you have any dental work done, tell your dentist you are receiving this medicine. Do not become pregnant while taking this medicine or for 14 months after stopping it. Women should inform their healthcare professional if they wish to become pregnant or think they might be pregnant. Men should not father a child while taking this medicine and for 11 months after stopping it. There is potential for   serious side effects to an unborn child. Talk to your healthcare professional for more information. Do not breast-feed an infant while taking this medicine. This medicine has caused ovarian failure in some women. This medicine may make it more  difficult to get pregnant. Talk to your healthcare professional if you are concerned about your fertility. This medicine has caused decreased sperm counts in some men. This may make it more difficult to father a child. Talk to your healthcare professional if you are concerned about your fertility. Drink fluids as directed while you are taking this medicine. This will help protect your kidneys. Call your doctor or health care professional if you get diarrhea. Do not treat yourself. What side effects may I notice from receiving this medicine? Side effects that you should report to your doctor or health care professional as soon as possible:  allergic reactions like skin rash, itching or hives, swelling of the face, lips, or tongue  blurred vision  changes in vision  decreased hearing or ringing of the ears  nausea, vomiting  pain, redness, or irritation at site where injected  pain, tingling, numbness in the hands or feet  signs and symptoms of bleeding such as bloody or black, tarry stools; red or dark brown urine; spitting up blood or brown material that looks like coffee grounds; red spots on the skin; unusual bruising or bleeding from the eyes, gums, or nose  signs and symptoms of infection like fever; chills; cough; sore throat; pain or trouble passing urine  signs and symptoms of kidney injury like trouble passing urine or change in the amount of urine  signs and symptoms of low red blood cells or anemia such as unusually weak or tired; feeling faint or lightheaded; falls; breathing problems Side effects that usually do not require medical attention (report to your doctor or health care professional if they continue or are bothersome):  loss of appetite  mouth sores  muscle cramps This list may not describe all possible side effects. Call your doctor for medical advice about side effects. You may report side effects to FDA at 1-800-FDA-1088. Where should I keep my  medicine? This drug is given in a hospital or clinic and will not be stored at home. NOTE: This sheet is a summary. It may not cover all possible information. If you have questions about this medicine, talk to your doctor, pharmacist, or health care provider.  2021 Elsevier/Gold Standard (2018-06-19 15:59:17)  

## 2020-09-21 NOTE — Progress Notes (Signed)
Patient here to establish care for endometrial cancer.

## 2020-09-21 NOTE — Progress Notes (Signed)
Hematology/Oncology Consult note [facility] [phone] [phone]   Patient Care Team: Gladstone Lighter, MD as PCP - General (Internal Medicine) Clent Jacks, RN as Oncology Nurse Navigator  REFERRING PROVIDER: Theora Gianotti, [place] Philbert Riser*  CHIEF COMPLAINTS/REASON FOR VISIT:  Evaluation of endometrial cancer.  HISTORY OF PRESENTING ILLNESS:   Courtney Mack is a  65 y.o.  female with PMH listed below was seen in consultation at the request of  Theora Gianotti, [place]*  for evaluation of endometrial cancer.  #February 2022, patient was hospitalized to due to left lower extremity DVT, extensive bilateral PE, status post embolectomy and IVC filter placement.  Patient was discharged on anticoagulation with Xarelto. Patient reports that she has had intermittent vaginal bleeding for the past few years.  After she was started on Xarelto, she started to experience frequent vaginal bleeding 09/06/2020, US pelvic complete showed a diffusely thickened and heterogeneous appearance of the endometrium, concerning for endometrial carcinoma.-  Uterus: Measurements: 10.4 x 5.2 x 5.9 cm = volume: 166 mL. No fibroids or other mass visualized. Endometrium Thickness: 29. The endometrium is diffusely heterogeneous and thickened with increased vascularity.Right and left ovaries: Not visualized.  No abnormal free fluid  Patient was evaluated by gynecology Dr. Amalia Hailey.  On physical examination, patient had malodorous friable necrotic appearing cervix was seen. Cervical biopsy performed. An endometrial biopsy was attempted but the cervical os was not well visualized.   # 09/06/2020 Cervical and endometrial biopsies A. UTERINE CERVIX; BIOPSY:   ENDOMETRIOID ENDOMETRIAL ADENOCARCINOMA, FAVOR LOW GRADE (FIGO GRADE 1-2).   B. ENDOMETRIUM; BIOPSY: - ENDOMETRIOID ENDOMETRIAL ADENOCARCINOMA, FAVOR LOW GRADE (FIGO GRADE 1-2).  She presents for evaluation. She continues to  have bleeding.  Patient was seen by gynecology oncology Dr. Theora Gianotti on 09/13/2020.  Patient is not a surgical candidate.  Refer to radiation oncology and my dog for further evaluation.  Patient presented to emergency room on 09/16/2020, 09/20/2020 for vaginal bleeding and passing clots.. 09/16/2020, hemoglobin 9.7, MCV 80 09/20/2020, hemoglobin 8.8, MCV 80.5. 08/24/2020, iron saturation 5, ferritin 12.  Today patient has been seen by radiation oncology.  PET scan has been scheduled on 09/28/2020. Due to the ongoing bleeding, decision was made to proceed with radiation on 09/25/2020. She reports very tired and fatigued.  Ongoing vaginal bleeding and passing blood clots..  Denies any chest pain, shortness of breath.Some left lower abdomen discomfort.  Chronic other medical problems include hypertension, morbid obesity, diabetes, chronic anticoagulation for recent bilateral extensive PE and a DVT.  Review of Systems  Constitutional: Positive for fatigue. Negative for appetite change, chills and fever.  HENT:   Negative for hearing loss and voice change.   Eyes: Negative for eye problems.  Respiratory: Negative for chest tightness and cough.   Cardiovascular: Positive for leg swelling. Negative for chest pain.  Gastrointestinal: Negative for abdominal distention, abdominal pain and blood in stool.  Endocrine: Negative for hot flashes.  Genitourinary: Positive for vaginal bleeding. Negative for difficulty urinating and frequency.   Musculoskeletal: Negative for arthralgias.  Skin: Negative for itching and rash.  Neurological: Negative for extremity weakness.  Hematological: Negative for adenopathy.  Psychiatric/Behavioral: Negative for confusion.      MEDICAL HISTORY:  Past Medical History:  Diagnosis Date  . Diabetes mellitus without complication ([place])   . DVT (deep venous thrombosis) ([place]) 09/06/2020  . High cholesterol   . Hx of blood clots   . Hypertension   . IDA (iron deficiency anemia)  09/21/2020    SURGICAL  HISTORY: Past Surgical History:  Procedure Laterality Date  . IVC FILTER INSERTION N/A 08/18/2020   Procedure: IVC FILTER INSERTION;  Surgeon: Katha Cabal, MD;  Location: [facility];  Service: Cardiovascular;  Laterality: N/A;  . PULMONARY THROMBECTOMY N/A 08/18/2020   Procedure: PULMONARY THROMBECTOMY;  Surgeon: Katha Cabal, MD;  Location: [facility];  Service: Cardiovascular;  Laterality: N/A;  . TUBAL LIGATION      SOCIAL HISTORY: Social History   Socioeconomic History  . Marital status: Single    Spouse name: Not on file  . Number of children: Not on file  . Years of education: Not on file  . Highest education level: Not on file  Occupational History  . Not on file  Tobacco Use  . Smoking status: Former Smoker    Packs/day: 0.25    Years: 0.50    Pack years: 0.12    Types: Cigarettes    Quit date: 09/21/1980    Years since quitting: 40.0  . Smokeless tobacco: Never Used  . Tobacco comment: smoked 2-4 cigarettes for about 2-3 weeks   Vaping Use  . Vaping Use: Never used  Substance and Sexual Activity  . Alcohol use: Not Currently  . Drug use: Not Currently  . Sexual activity: Not on file  Other Topics Concern  . Not on file  Social History Narrative  . Not on file   Social Determinants of Health   Financial Resource Strain: Not on file  Food Insecurity: Not on file  Transportation Needs: Not on file  Physical Activity: Not on file  Stress: Not on file  Social Connections: Not on file  Intimate Partner Violence: Not on file    FAMILY HISTORY: Family History  Problem Relation Age of Onset  . Hypertension Mother   . Diabetes Mother   . Diabetes Father   . Hypertension Father   . High Cholesterol Father   . Congestive Heart Failure Father     ALLERGIES:  has No Known Allergies.  MEDICATIONS:  Current Outpatient Medications  Medication Sig Dispense Refill  . Ensure Max Protein (ENSURE MAX  PROTEIN) LIQD Take 330 mLs (11 oz total) by mouth 2 (two) times daily.    . Fluticasone-Salmeterol (ADVAIR) 100-50 MCG/DOSE AEPB Inhale 1 puff into the lungs 2 (two) times daily. Rinse mouth after each use    . losartan (COZAAR) 25 MG tablet Take 1 tablet (25 mg total) by mouth daily. 30 tablet 0  . metFORMIN (GLUCOPHAGE) 500 MG tablet Take 1 tablet (500 mg total) by mouth 2 (two) times daily with a meal. 60 tablet 0  . Multiple Vitamin (MULTIVITAMIN WITH MINERALS) TABS tablet Take 1 tablet by mouth daily.    . norethindrone (AYGESTIN) 5 MG tablet Take 1 tablet (5 mg total) by mouth 3 (three) times daily.    Marland Kitchen nystatin (MYCOSTATIN/NYSTOP) powder Apply topically 2 (two) times daily. 15 g 0  . ondansetron (ZOFRAN) 4 MG tablet Take 1 tablet (4 mg total) by mouth every 6 (six) hours as needed for nausea. 20 tablet 0  . rivaroxaban (XARELTO) 20 MG TABS tablet Take 1 tablet (20 mg total) by mouth daily with supper.    . rosuvastatin (CRESTOR) 10 MG tablet Take 1 tablet (10 mg total) by mouth daily. 30 tablet 0   No current facility-administered medications for this visit.     PHYSICAL EXAMINATION: ECOG PERFORMANCE STATUS: 2 - Symptomatic, <50% confined to bed Vitals:   09/21/20 1124  BP: Marland Kitchen)  113/45  Pulse: 88  Resp: 18  Temp: (!) 97.5 F (36.4 C)   Filed Weights   09/21/20 1124  Weight: 252 lb (114.3 kg)    Physical Exam Constitutional:      General: She is not in acute distress.    Appearance: She is obese.  HENT:     Head: Normocephalic and atraumatic.  Eyes:     General: No scleral icterus. Cardiovascular:     Rate and Rhythm: Normal rate and regular rhythm.     Heart sounds: Normal heart sounds.  Pulmonary:     Effort: Pulmonary effort is normal. No respiratory distress.     Breath sounds: No wheezing.  Abdominal:     General: Bowel sounds are normal. There is no distension.     Palpations: Abdomen is soft.  Musculoskeletal:        General: No deformity. Normal range of  motion.     Cervical back: Normal range of motion and neck supple.  Skin:    General: Skin is warm and dry.     Coloration: Skin is pale.     Findings: No erythema or rash.  Neurological:     Mental Status: She is alert and oriented to person, place, and time. Mental status is at baseline.     Cranial Nerves: No cranial nerve deficit.     Coordination: Coordination normal.  Psychiatric:        Mood and Affect: Mood normal.     LABORATORY DATA:  I have reviewed the data as listed Lab Results  Component Value Date   WBC 9.8 09/20/2020   HGB 8.8 (L) 09/20/2020   HCT 29.3 (L) 09/20/2020   MCV 80.5 09/20/2020   PLT 394 09/20/2020   Recent Labs    08/18/20 1159 08/19/20 0303 08/24/20 0643 09/05/20 2119 09/06/20 0415 09/07/20 0404 09/16/20 0920 09/20/20 0327  NA 138   < > 136 134*   < > 137 137 134*  K 4.0   < > 4.0 4.0   < > 4.0 4.2 4.3  CL 102   < > 104 102   < > 106 104 103  CO2 24   < > 25 26   < > 25 25 23   GLUCOSE 225*   < > 148* 108*   < > 106* 142* 105*  BUN 16   < > 16 11   < > 12 9 9   CREATININE 0.92   < > 0.86 0.69   < > 0.60 0.79 0.70  CALCIUM 9.4   < > 8.6* 9.0   < > 8.5* 9.1 8.5*  GFRNONAA >60   < > >60 >60   < > >60 >60 >60  PROT 7.8  --  6.3* 7.5  --   --   --   --   ALBUMIN 3.5  --  3.0* 3.5  --   --   --   --   AST 23  --  19 20  --   --   --   --   ALT 11  --  12 10  --   --   --   --   ALKPHOS 70  --  61 56  --   --   --   --   BILITOT 0.9  --  1.3* 1.4*  --   --   --   --    < > = values in this interval not displayed.   Iron/TIBC/Ferritin/ %  Sat    Component Value Date/Time   IRON 17 (L) 08/24/2020 1311   TIBC 370 08/24/2020 1311   FERRITIN 12 08/24/2020 1311   IRONPCTSAT 5 (L) 08/24/2020 1311      RADIOGRAPHIC STUDIES: I have personally reviewed the radiological images as listed and agreed with the findings in the report. US PELVIC COMPLETE WITH TRANSVAGINAL  Result Date: 09/06/2020 CLINICAL DATA:  Postmenopausal bleeding EXAM:  TRANSABDOMINAL AND TRANSVAGINAL ULTRASOUND OF PELVIS TECHNIQUE: Both transabdominal and transvaginal ultrasound examinations of the pelvis were performed. Transabdominal technique was performed for global imaging of the pelvis including uterus, ovaries, adnexal regions, and pelvic cul-de-sac. It was necessary to proceed with endovaginal exam following the transabdominal exam to visualize the ovaries. COMPARISON:  None FINDINGS: Uterus Measurements: 10.4 x 5.2 x 5.9 cm = volume: 166 mL. No fibroids or other mass visualized. Endometrium Thickness: 29. The endometrium is diffusely heterogeneous and thickened with increased vascularity. Right ovary Not visualized Left ovary Not visualized Other findings No abnormal free fluid. IMPRESSION: 1. Diffusely thickened and heterogeneous appearance of the endometrium is concerning for endometrial carcinoma. Gynecologic follow-up is recommended. 2. Neither ovary was visualized. Electronically Signed   By: Constance Holster M.D.   On: 09/06/2020 01:28      ASSESSMENT & PLAN:  1. Endometrial cancer ([place])   2. Iron deficiency anemia due to chronic blood loss    #either at least stage II endometrial cancer with cervical involvement or cervical cancer.  PET scan for staging. I discussed with gynecology oncology Dr. Fransisca Connors who recommends concurrent chemo and radiation. May Consider PORTEC regimen. Awaiting PET  #Iron deficiency anemia due to blood loss. Plan IV iron with Venofer 200mg  twice weekly x 4. Allergy reactions/infusion reaction including anaphylactic reaction discussed with patient. Other side effects include but not limited to high blood pressure, skin rash, weight gain, leg swelling, etc. Patient voices understanding and willing to proceed.    Orders Placed This Encounter  Procedures  . Comprehensive metabolic panel    Standing Status:   Standing    Number of Occurrences:   20    Standing Expiration Date:   09/21/2021  . CBC with Differential     Standing Status:   Standing    Number of Occurrences:   20    Standing Expiration Date:   09/21/2021  . Magnesium    Standing Status:   Standing    Number of Occurrences:   20    Standing Expiration Date:   09/21/2021  . PHYSICIAN COMMUNICATION ORDER    A baseline Audiogram is recommended prior to initiation of cisplatin chemotherapy.    All questions were answered. The patient knows to call the clinic with any problems questions or concerns.   [place], [place]*    Return of visit:  Thank you for this kind referral and the opportunity to participate in the care of this patient. A copy of today's note is routed to referring provider    Earlie Server, MD, PhD Hematology Oncology [facility] at [facility] Pager- [phone] 09/21/2020

## 2020-09-21 NOTE — Consult Note (Signed)
NEW PATIENT EVALUATION  Name: Courtney Mack  MRN: [id number]  Date:   09/21/2020     DOB: [birth year]   This 65 y.o. female patient presents to the clinic for initial evaluation of vaginal bleeding and patient with known endometrial adenocarcinoma currently on Xarelto secondary to DVT.  REFERRING PHYSICIAN: Gladstone Lighter, MD  CHIEF COMPLAINT:  Chief Complaint  Patient presents with  . Cancer    Initial consultation    DIAGNOSIS: The encounter diagnosis was Endometrial cancer ([place]).   PREVIOUS INVESTIGATIONS:  Ultrasound reviewed PET CT scan has been ordered Pathology reviewed Clinical notes reviewed  HPI: Patient is a 65 year old female with current history of embolectomy and IVC filter placement secondary to DVT.  She presented recently to the emergency room with vaginal bleeding pelvic ultrasound showed thickening of the endometrial cavity concerning for endometrial carcinoma.  Uterus measures 10.4 x 5.2 x 5.9 cm with a thickness of 29 mm.  On speculum examination she had friable necrotic appearing cervix cervical biopsy was performed and endometrial biopsy also was obtained.  Uterine cervix biopsy showed endometrioid and metria carcinoma favoring low-grade FIGO grade 1-2.  Endometrial biopsy also showed the same.  Patient has known bilateral pulmonary emboli with CT evidence of right heart strain.  Her diagnosis currently is endometrial carcinoma at least stage II with cervical involvement.  PET CT has been ordered.  She has been written evaluated by Dr. Theora Gianotti not thought to be a surgical candidate based on multiple medical comorbidities.  I have been asked to evaluate her for possible emergent radiation therapy to stop further bleeding and for definitive treatment of her endometrial carcinoma.  She is seen today and is doing fairly well her bleeding has subsided somewhat.  She does have occasional intermittent pelvic pain.  Her bowel movements tends towards  constipation.  PLANNED TREATMENT REGIMEN: External beam radiation therapy  PAST MEDICAL HISTORY:  has a past medical history of DVT (deep venous thrombosis) ([place]) (09/06/2020) and blood clots.    PAST SURGICAL HISTORY:  Past Surgical History:  Procedure Laterality Date  . IVC FILTER INSERTION N/A 08/18/2020   Procedure: IVC FILTER INSERTION;  Surgeon: Katha Cabal, MD;  Location: [facility];  Service: Cardiovascular;  Laterality: N/A;  . PULMONARY THROMBECTOMY N/A 08/18/2020   Procedure: PULMONARY THROMBECTOMY;  Surgeon: Katha Cabal, MD;  Location: [facility];  Service: Cardiovascular;  Laterality: N/A;  . TUBAL LIGATION      FAMILY HISTORY: family history is not on file.  SOCIAL HISTORY:  reports that she has quit smoking. She has never used smokeless tobacco. She reports previous alcohol use. She reports previous drug use.  ALLERGIES: Patient has no known allergies.  MEDICATIONS:  Current Outpatient Medications  Medication Sig Dispense Refill  . Ensure Max Protein (ENSURE MAX PROTEIN) LIQD Take 330 mLs (11 oz total) by mouth 2 (two) times daily.    . Fluticasone-Salmeterol (ADVAIR) 100-50 MCG/DOSE AEPB Inhale 1 puff into the lungs 2 (two) times daily. Rinse mouth after each use    . losartan (COZAAR) 25 MG tablet Take 1 tablet (25 mg total) by mouth daily. 30 tablet 0  . metFORMIN (GLUCOPHAGE) 500 MG tablet Take 1 tablet (500 mg total) by mouth 2 (two) times daily with a meal. 60 tablet 0  . Multiple Vitamin (MULTIVITAMIN WITH MINERALS) TABS tablet Take 1 tablet by mouth daily.    . norethindrone (AYGESTIN) 5 MG tablet Take 1 tablet (5 mg total) by mouth 3 (  three) times daily.    Marland Kitchen nystatin (MYCOSTATIN/NYSTOP) powder Apply topically 2 (two) times daily. 15 g 0  . ondansetron (ZOFRAN) 4 MG tablet Take 1 tablet (4 mg total) by mouth every 6 (six) hours as needed for nausea. 20 tablet 0  . rivaroxaban (XARELTO) 20 MG TABS tablet Take 1 tablet (20 mg total)  by mouth daily with supper.    . rosuvastatin (CRESTOR) 10 MG tablet Take 1 tablet (10 mg total) by mouth daily. 30 tablet 0   No current facility-administered medications for this encounter.    ECOG PERFORMANCE STATUS:  2 - Symptomatic, <50% confined to bed  REVIEW OF SYSTEMS: Patient is morbidly obese.  She has significant swelling in her lower extremities. Patient denies any weight loss, fatigue, weakness, fever, chills or night sweats. Patient denies any loss of vision, blurred vision. Patient denies any ringing  of the ears or hearing loss. No irregular heartbeat. Patient denies heart murmur or history of fainting. Patient denies any chest pain or pain radiating to her upper extremities. Patient denies any shortness of breath, difficulty breathing at night, cough or hemoptysis. Patient denies any swelling in the lower legs. Patient denies any nausea vomiting, vomiting of blood, or coffee ground material in the vomitus. Patient denies any stomach pain. Patient states has had normal bowel movements no significant constipation or diarrhea. Patient denies any dysuria, hematuria or significant nocturia. Patient denies any problems walking, swelling in the joints or loss of balance. Patient denies any skin changes, loss of hair or loss of weight. Patient denies any excessive worrying or anxiety or significant depression. Patient denies any problems with insomnia. Patient denies excessive thirst, polyuria, polydipsia. Patient denies any swollen glands, patient denies easy bruising or easy bleeding. Patient denies any recent infections, allergies or URI. Patient "s visual fields have not changed significantly in recent time.   PHYSICAL EXAM: BP (!) 113/45 (BP Location: Left Arm, Patient Position: Sitting)   Pulse 88   Temp (!) 97.5 F (36.4 C) (Tympanic)   Resp 18   Wt 252 lb (114.3 kg)   BMI 52.67 kg/m  Patient is wheelchair-bound.  She has extensive lower extremity lymphedema.  Abdomen is benign.   Well-developed well-nourished patient in NAD. HEENT reveals PERLA, EOMI, discs not visualized.  Oral cavity is clear. No oral mucosal lesions are identified. Neck is clear without evidence of cervical or supraclavicular adenopathy. Lungs are clear to A&P. Cardiac examination is essentially unremarkable with regular rate and rhythm without murmur rub or thrill. Abdomen is benign with no organomegaly or masses noted. Motor sensory and DTR levels are equal and symmetric in the upper and lower extremities. Cranial nerves II through XII are grossly intact. Proprioception is intact. No peripheral adenopathy or edema is identified. No motor or sensory levels are noted. Crude visual fields are within normal range.  LABORATORY DATA: Pathology report reviewed    RADIOLOGY RESULTS: Ultrasound reviewed PET CT scan has been ordered   IMPRESSION: At least stage II FIGO grade 1-2 endometrial carcinoma in 65 year old female with cervical involvement and bleeding secondary to tumor involvement as well as current anticoagulation therapy  PLAN: At this time like to start radiation therapy on her pelvis.  I would plan on delivering 4500 cGy over 5 weeks.  Patient also will need a referral to [facility] to Dr. Christel Mormon for possible brachytherapy in the future.  We will simulate her today and start her treatments as soon as possible to try to diminish her bleeding which  is causing anemia.  Risks and benefits of treatment including skin reaction fatigue alteration blood counts possible ended increased lower urinary tract symptoms diarrhea all were discussed with the patient.  She seems to comprehend her treatment plan well is consented to treatment.  She was simulated today.  I would like to take this opportunity to thank you for allowing me to participate in the care of your patient.Noreene Filbert, MD

## 2020-09-21 NOTE — Progress Notes (Signed)
START OFF PATHWAY REGIMEN - Uterine   [id number]:Cisplatin 100 mg/m2 IV D1 q21 Days + RT:   A cycle is every 21 days:     Cisplatin   **Always confirm dose/schedule in your pharmacy ordering system**  Patient Characteristics: Endometrioid, Newly Diagnosed (Clinical Staging), Nonsurgical Candidate, Stage I-II Histology: Endometrioid Therapeutic Status: Newly Diagnosed (Clinical Staging) AJCC M Category: cM0 AJCC 8 Stage Grouping: II AJCC T Category: cTX AJCC N Category: cNX Intent of Therapy: Non-Curative / Palliative Intent, Discussed with Patient

## 2020-09-22 ENCOUNTER — Inpatient Hospital Stay (HOSPITAL_BASED_OUTPATIENT_CLINIC_OR_DEPARTMENT_OTHER): Payer: Medicare HMO | Admitting: Hospice and Palliative Medicine

## 2020-09-22 ENCOUNTER — Inpatient Hospital Stay: Payer: Medicare HMO

## 2020-09-22 ENCOUNTER — Other Ambulatory Visit: Payer: Self-pay

## 2020-09-22 VITALS — BP 137/68 | HR 92 | Temp 99.0°F | Resp 18

## 2020-09-22 DIAGNOSIS — R7401 Elevation of levels of liver transaminase levels: Secondary | ICD-10-CM | POA: Diagnosis not present

## 2020-09-22 DIAGNOSIS — C541 Malignant neoplasm of endometrium: Secondary | ICD-10-CM | POA: Diagnosis not present

## 2020-09-22 DIAGNOSIS — I82402 Acute embolism and thrombosis of unspecified deep veins of left lower extremity: Secondary | ICD-10-CM | POA: Diagnosis not present

## 2020-09-22 DIAGNOSIS — I2699 Other pulmonary embolism without acute cor pulmonale: Secondary | ICD-10-CM | POA: Diagnosis not present

## 2020-09-22 DIAGNOSIS — E119 Type 2 diabetes mellitus without complications: Secondary | ICD-10-CM | POA: Diagnosis not present

## 2020-09-22 DIAGNOSIS — Z510 Encounter for antineoplastic radiation therapy: Secondary | ICD-10-CM | POA: Diagnosis not present

## 2020-09-22 DIAGNOSIS — D500 Iron deficiency anemia secondary to blood loss (chronic): Secondary | ICD-10-CM

## 2020-09-22 DIAGNOSIS — Z6841 Body Mass Index (BMI) 40.0 and over, adult: Secondary | ICD-10-CM | POA: Diagnosis not present

## 2020-09-22 DIAGNOSIS — K2100 Gastro-esophageal reflux disease with esophagitis, without bleeding: Secondary | ICD-10-CM

## 2020-09-22 DIAGNOSIS — I10 Essential (primary) hypertension: Secondary | ICD-10-CM | POA: Diagnosis not present

## 2020-09-22 MED ORDER — ALUM & MAG HYDROXIDE-SIMETH 200-200-20 MG/5ML PO SUSP
30.0000 mL | Freq: Once | ORAL | Status: AC
  Administered 2020-09-22: 30 mL via ORAL
  Filled 2020-09-22: qty 30

## 2020-09-22 MED ORDER — SODIUM CHLORIDE 0.9 % IV SOLN
Freq: Once | INTRAVENOUS | Status: AC
  Filled 2020-09-22: qty 250

## 2020-09-22 MED ORDER — IRON SUCROSE 20 MG/ML IV SOLN
200.0000 mg | Freq: Once | INTRAVENOUS | Status: AC
  Administered 2020-09-22: 200 mg via INTRAVENOUS
  Filled 2020-09-22: qty 10

## 2020-09-22 MED ORDER — SODIUM CHLORIDE 0.9 % IV SOLN: 200.0000 mg | Freq: Once | INTRAVENOUS | Status: DC

## 2020-09-22 NOTE — Progress Notes (Signed)
[place]  Telephone:([phone] [phone]    I connected with@ on 09/22/20 at 11:00 AM EDT by telephone and verified that I am speaking with the correct person using two identifiers.  Location: Patient: Home Provider: Clinic   I discussed the limitations, risks, security and privacy concerns of performing an evaluation and management service by telephone and the availability of in person appointments. I also discussed with the patient that there may be a patient responsible charge related to this service. The patient expressed understanding and agreed to proceed.   History of Present Illness: Patient presents to chemo care clinic today for initial meeting in preparation for starting chemotherapy. I introduced the chemo care clinic and we discussed that the role of the clinic is to assist those who are at an increased risk of emergency room visits and/or complications during the course of chemotherapy treatment. We discussed that the increased risk takes into account factors such as age, performance status, and co-morbidities. We also discussed that for some, this might include barriers to care such as not having a primary care provider, lack of insurance/transportation, or not being able to afford medications. We discussed that the goal of the program is to help prevent unplanned ER visits and help reduce complications during chemotherapy. We do this by discussing specific risk factors to each individual and identifying ways that we can help improve these risk factors and reduce barriers to care.    Observations/Objective: Patient reports that she is doing reasonably well.  She is having some constipation.  We discussed constipation management in detail.  I would recommend adding daily senna and/or MiraLAX.   Assessment/Plan: Patient is a 65 y.o. female who presents to [facility] for initial meeting in preparation for  starting chemotherapy.  Chemo Care Clinic/High Risk for ER/Hospitalization during chemotherapy- We discussed the role of the chemo care clinic and identified patient specific risk factors.  Patient has past medical history positive for: Past Medical History:  Diagnosis Date  . Diabetes mellitus without complication ([place])   . DVT (deep venous thrombosis) ([place]) 09/06/2020  . High cholesterol   . Hx of blood clots   . Hypertension   . IDA (iron deficiency anemia) 09/21/2020    Patient has past surgical history positive for: Past Surgical History:  Procedure Laterality Date  . IVC FILTER INSERTION N/A 08/18/2020   Procedure: IVC FILTER INSERTION;  Surgeon: Katha Cabal, MD;  Location: [place] [facility];  Service: Cardiovascular;  Laterality: N/A;  . PULMONARY THROMBECTOMY N/A 08/18/2020   Procedure: PULMONARY THROMBECTOMY;  Surgeon: Katha Cabal, MD;  Location: [place] [facility];  Service: Cardiovascular;  Laterality: N/A;  . TUBAL LIGATION       We discussed that social determinants of health may have significant impacts on health and outcomes for cancer patients.  Today we discussed specific social determinants of performance status, alcohol use, depression, financial needs, food insecurity, housing, interpersonal violence, social connections, stress, tobacco use, and transportation.    After discussion the following were identified as areas of need:   Outpatient services: We discussed options including home based and outpatient services, DME and care program. We discusssed that patients who participate in regular physical activity report fewer negative impacts of cancer and treatments and report less fatigue.   Financial Concerns: We discussed that living with cancer can create tremendous financial burden.  We discussed options for assistance. I asked that if assistance is  needed in affording medications or paying bills to please let us know so that we can provide  assistance. We discussed options for food including social services, [employer] ($50 every 2 weeks) and onsite food pantry.  We will also notify [facility] to see if cancer center can provide additional support.  Referral to Social work: Introduced Education officer, museum Elease Etienne and the services he can provide such as support with utility bill, cell phone and gas vouchers.   Support groups: We discussed options for support groups at the cancer center. If interested, please notify nurse navigator to enroll. We discussed options for managing stress including healthy eating, exercise as well as participating in no charge counseling services at the cancer center and support groups.  If these are of interest, patient can notify either myself or primary nursing team.We discussed options for management including medications and referral to quit Smart program  Transportation: We discussed options for transportation including [employer], paratransit, bus routes, [employer] transit, taxi/uber/lyft, and cancer center van.  I have notified primary oncology team who will help assist with arranging Lucianne Lei transportation for appointments when/if needed. We also discussed options for transportation on short notice/acute visits.  Palliative care services: We have palliative care services available in the cancer center to discuss goals of care and advanced care planning.  Please let us know if you have any questions or would like to speak to our palliative nurse practitioner.  Symptom Management Clinic: We discussed our symptom management clinic which is available for acute concerns while receiving treatment such as nausea, vomiting or diarrhea.  We can be reached via telephone at [phone] or through my chart.  We are available for virtual or in person visits on the same day from 830 to 4 PM Monday through Friday. She denies needing specific assistance at this time and She will be followed by Dr. Collie Siad clinical  team.   Disposition: RTC on 3/21 as already scheduled   Patient expressed understanding and was in agreement with this plan. She also understands that She can call clinic at any time with any questions, concerns, or complaints.   I provided 10 minutes of non face-to-face telephone visit time during this encounter, and > 50% was spent counseling as documented under my assessment & plan.   Altha Harm, PhD, NP-C [place] [facility] [phone]

## 2020-09-25 ENCOUNTER — Encounter: Payer: Self-pay | Admitting: Oncology

## 2020-09-25 ENCOUNTER — Inpatient Hospital Stay (HOSPITAL_BASED_OUTPATIENT_CLINIC_OR_DEPARTMENT_OTHER): Payer: Medicare HMO | Admitting: Oncology

## 2020-09-25 ENCOUNTER — Inpatient Hospital Stay: Payer: Medicare HMO

## 2020-09-25 ENCOUNTER — Other Ambulatory Visit: Payer: Self-pay

## 2020-09-25 ENCOUNTER — Ambulatory Visit: Payer: Medicare HMO

## 2020-09-25 VITALS — BP 107/68 | HR 92 | Temp 98.7°F | Resp 20 | Wt 241.0 lb

## 2020-09-25 VITALS — BP 110/57 | HR 84 | Resp 18

## 2020-09-25 DIAGNOSIS — D62 Acute posthemorrhagic anemia: Secondary | ICD-10-CM

## 2020-09-25 DIAGNOSIS — I10 Essential (primary) hypertension: Secondary | ICD-10-CM | POA: Diagnosis not present

## 2020-09-25 DIAGNOSIS — D500 Iron deficiency anemia secondary to blood loss (chronic): Secondary | ICD-10-CM | POA: Diagnosis not present

## 2020-09-25 DIAGNOSIS — E119 Type 2 diabetes mellitus without complications: Secondary | ICD-10-CM | POA: Diagnosis not present

## 2020-09-25 DIAGNOSIS — C541 Malignant neoplasm of endometrium: Secondary | ICD-10-CM

## 2020-09-25 DIAGNOSIS — I82402 Acute embolism and thrombosis of unspecified deep veins of left lower extremity: Secondary | ICD-10-CM | POA: Diagnosis not present

## 2020-09-25 DIAGNOSIS — I2699 Other pulmonary embolism without acute cor pulmonale: Secondary | ICD-10-CM | POA: Diagnosis not present

## 2020-09-25 DIAGNOSIS — R7401 Elevation of levels of liver transaminase levels: Secondary | ICD-10-CM | POA: Diagnosis not present

## 2020-09-25 DIAGNOSIS — Z6841 Body Mass Index (BMI) 40.0 and over, adult: Secondary | ICD-10-CM | POA: Diagnosis not present

## 2020-09-25 LAB — COMPREHENSIVE METABOLIC PANEL
ALT: 195 U/L — ABNORMAL HIGH (ref 0–44)
AST: 178 U/L — ABNORMAL HIGH (ref 15–41)
Albumin: 3.2 g/dL — ABNORMAL LOW (ref 3.5–5.0)
Alkaline Phosphatase: 40 U/L (ref 38–126)
Anion gap: 10 (ref 5–15)
BUN: 14 mg/dL (ref 8–23)
CO2: 26 mmol/L (ref 22–32)
Calcium: 9.1 mg/dL (ref 8.9–10.3)
Chloride: 99 mmol/L (ref 98–111)
Creatinine, Ser: 0.93 mg/dL (ref 0.44–1.00)
GFR, Estimated: >60 mL/min (ref >60)
Glucose, Bld: 155 mg/dL — ABNORMAL HIGH (ref 70–99)
Potassium: 4.1 mmol/L (ref 3.5–5.1)
Sodium: 135 mmol/L (ref 135–145)
Total Bilirubin: 0.8 mg/dL (ref 0.3–1.2)
Total Protein: 7.1 g/dL (ref 6.5–8.1)

## 2020-09-25 LAB — CBC WITH DIFFERENTIAL/PLATELET
Abs Immature Granulocytes: 0.07 10*3/uL (ref 0.00–0.07)
Basophils Absolute: 0.1 10*3/uL (ref 0.0–0.1)
Basophils Relative: 1 %
Eosinophils Absolute: 0.1 10*3/uL (ref 0.0–0.5)
Eosinophils Relative: 2 %
HCT: 31.4 % — ABNORMAL LOW (ref 36.0–46.0)
Hemoglobin: 9.2 g/dL — ABNORMAL LOW (ref 12.0–15.0)
Immature Granulocytes: 1 %
Lymphocytes Relative: 22 %
Lymphs Abs: 2.1 10*3/uL (ref 0.7–4.0)
MCH: 23.8 pg — ABNORMAL LOW (ref 26.0–34.0)
MCHC: 29.3 g/dL — ABNORMAL LOW (ref 30.0–36.0)
MCV: 81.1 fL (ref 80.0–100.0)
Monocytes Absolute: 0.6 10*3/uL (ref 0.1–1.0)
Monocytes Relative: 6 %
Neutro Abs: 6.6 10*3/uL (ref 1.7–7.7)
Neutrophils Relative %: 68 %
Platelets: 443 10*3/uL — ABNORMAL HIGH (ref 150–400)
RBC: 3.87 MIL/uL (ref 3.87–5.11)
RDW: 19.5 % — ABNORMAL HIGH (ref 11.5–15.5)
WBC: 9.6 10*3/uL (ref 4.0–10.5)
nRBC: 0.4 % — ABNORMAL HIGH (ref 0.0–0.2)

## 2020-09-25 LAB — MAGNESIUM: Magnesium: 1.8 mg/dL (ref 1.7–2.4)

## 2020-09-25 MED ORDER — SODIUM CHLORIDE 0.9 % IV SOLN: 200.0000 mg | Freq: Once | INTRAVENOUS | Status: DC

## 2020-09-25 MED ORDER — IRON SUCROSE 20 MG/ML IV SOLN
200.0000 mg | Freq: Once | INTRAVENOUS | Status: AC
  Administered 2020-09-25: 200 mg via INTRAVENOUS
  Filled 2020-09-25: qty 10

## 2020-09-25 MED ORDER — SODIUM CHLORIDE 0.9 % IV SOLN
Freq: Once | INTRAVENOUS | Status: AC
Start: 2020-09-25 — End: 2020-09-25
  Filled 2020-09-25: qty 250

## 2020-09-25 NOTE — Progress Notes (Signed)
Hematology/Oncology Consult note [facility] [phone] [phone]   Patient Care Team: Gladstone Lighter, MD as PCP - General (Internal Medicine) Clent Jacks, RN as Oncology Nurse Navigator  REFERRING PROVIDER: Gladstone Lighter, MD  CHIEF COMPLAINTS/REASON FOR VISIT:  Evaluation of endometrial cancer.  HISTORY OF PRESENTING ILLNESS:   Courtney Mack is a  65 y.o.  female with PMH listed below was seen in consultation at the request of  Gladstone Lighter, MD  for evaluation of endometrial cancer.  #February 2022, patient was hospitalized to due to left lower extremity DVT, extensive bilateral PE, status post embolectomy and IVC filter placement.  Patient was discharged on anticoagulation with Xarelto. Patient reports that she has had intermittent vaginal bleeding for the past few years.  After she was started on Xarelto, she started to experience frequent vaginal bleeding 09/06/2020, US pelvic complete showed a diffusely thickened and heterogeneous appearance of the endometrium, concerning for endometrial carcinoma.-  Uterus: Measurements: 10.4 x 5.2 x 5.9 cm = volume: 166 mL. No fibroids or other mass visualized. Endometrium Thickness: 29. The endometrium is diffusely heterogeneous and thickened with increased vascularity.Right and left ovaries: Not visualized.  No abnormal free fluid  Patient was evaluated by gynecology Dr. Amalia Hailey.  On physical examination, patient had malodorous friable necrotic appearing cervix was seen. Cervical biopsy performed. An endometrial biopsy was attempted but the cervical os was not well visualized.   # 09/06/2020 Cervical and endometrial biopsies A. UTERINE CERVIX; BIOPSY:   ENDOMETRIOID ENDOMETRIAL ADENOCARCINOMA, FAVOR LOW GRADE (FIGO GRADE 1-2).   B. ENDOMETRIUM; BIOPSY: - ENDOMETRIOID ENDOMETRIAL ADENOCARCINOMA, FAVOR LOW GRADE (FIGO GRADE 1-2).  She presents for evaluation. She continues to have  bleeding.  Patient was seen by gynecology oncology Dr. Theora Gianotti on 09/13/2020.  Patient is not a surgical candidate.  Refer to radiation oncology and my dog for further evaluation.  Patient presented to emergency room on 09/16/2020, 09/20/2020 for vaginal bleeding and passing clots.. 09/16/2020, hemoglobin 9.7, MCV 80 09/20/2020, hemoglobin 8.8, MCV 80.5. 08/24/2020, iron saturation 5, ferritin 12.  Today patient has been seen by radiation oncology.  PET scan has been scheduled on 09/28/2020. Due to the ongoing bleeding, decision was made to proceed with radiation on 09/25/2020. She reports very tired and fatigued.  Ongoing vaginal bleeding and passing blood clots..  Denies any chest pain, shortness of breath.Some left lower abdomen discomfort.  Chronic other medical problems include hypertension, morbid obesity, diabetes, chronic anticoagulation for recent bilateral extensive PE and a DVT.   INTERVAL HISTORY Courtney Mack is a 65 y.o. female who has above history reviewed by me today presents for follow up visit for management of endometrial cancer Problems and complaints are listed below: Patient has establish care with radiation oncology and there is plan to start radiation tomorrow. Patient feels weak.  Status post 1 treatment of Venofer and tolerated well Vaginal bleeding has stopped. Review of Systems  Constitutional: Positive for fatigue. Negative for appetite change, chills and fever.  HENT:   Negative for hearing loss and voice change.   Eyes: Negative for eye problems.  Respiratory: Negative for chest tightness and cough.   Cardiovascular: Positive for leg swelling. Negative for chest pain.  Gastrointestinal: Negative for abdominal distention, abdominal pain and blood in stool.  Endocrine: Negative for hot flashes.  Genitourinary: Positive for vaginal bleeding. Negative for difficulty urinating and frequency.   Musculoskeletal: Negative for arthralgias.  Skin: Negative for itching  and rash.  Neurological: Negative for extremity weakness.  Hematological: Negative for adenopathy.  Psychiatric/Behavioral: Negative for confusion.      MEDICAL HISTORY:  Past Medical History:  Diagnosis Date  . Diabetes mellitus without complication ([place])   . DVT (deep venous thrombosis) ([place]) 09/06/2020  . High cholesterol   . Hx of blood clots   . Hypertension   . IDA (iron deficiency anemia) 09/21/2020    SURGICAL HISTORY: Past Surgical History:  Procedure Laterality Date  . IVC FILTER INSERTION N/A 08/18/2020   Procedure: IVC FILTER INSERTION;  Surgeon: Katha Cabal, MD;  Location: [place];  Service: Cardiovascular;  Laterality: N/A;  . PULMONARY THROMBECTOMY N/A 08/18/2020   Procedure: PULMONARY THROMBECTOMY;  Surgeon: Katha Cabal, MD;  Location: [place] [facility];  Service: Cardiovascular;  Laterality: N/A;  . TUBAL LIGATION      SOCIAL HISTORY: Social History   Socioeconomic History  . Marital status: Single    Spouse name: Not on file  . Number of children: Not on file  . Years of education: Not on file  . Highest education level: Not on file  Occupational History  . Not on file  Tobacco Use  . Smoking status: Former Smoker    Packs/day: 0.25    Years: 0.50    Pack years: 0.12    Types: Cigarettes    Quit date: 09/21/1980    Years since quitting: 40.0  . Smokeless tobacco: Never Used  . Tobacco comment: smoked 2-4 cigarettes for about 2-3 weeks   Vaping Use  . Vaping Use: Never used  Substance and Sexual Activity  . Alcohol use: Not Currently  . Drug use: Not Currently  . Sexual activity: Not on file  Other Topics Concern  . Not on file  Social History Narrative  . Not on file   Social Determinants of Health   Financial Resource Strain: Not on file  Food Insecurity: Not on file  Transportation Needs: Not on file  Physical Activity: Not on file  Stress: Not on file  Social Connections: Not on file  Intimate Partner  Violence: Not on file    FAMILY HISTORY: Family History  Problem Relation Age of Onset  . Hypertension Mother   . Diabetes Mother   . Diabetes Father   . Hypertension Father   . High Cholesterol Father   . Congestive Heart Failure Father     ALLERGIES:  has No Known Allergies.  MEDICATIONS:  Current Outpatient Medications  Medication Sig Dispense Refill  . Ensure Max Protein (ENSURE MAX PROTEIN) LIQD Take 330 mLs (11 oz total) by mouth 2 (two) times daily.    . Fluticasone-Salmeterol (ADVAIR) 100-50 MCG/DOSE AEPB Inhale 1 puff into the lungs 2 (two) times daily. Rinse mouth after each use    . losartan (COZAAR) 25 MG tablet Take 1 tablet (25 mg total) by mouth daily. 30 tablet 0  . metFORMIN (GLUCOPHAGE) 500 MG tablet Take 1 tablet (500 mg total) by mouth 2 (two) times daily with a meal. 60 tablet 0  . Multiple Vitamin (MULTIVITAMIN WITH MINERALS) TABS tablet Take 1 tablet by mouth daily.    . norethindrone (AYGESTIN) 5 MG tablet Take 1 tablet (5 mg total) by mouth 3 (three) times daily.    Marland Kitchen nystatin (MYCOSTATIN/NYSTOP) powder Apply topically 2 (two) times daily. 15 g 0  . ondansetron (ZOFRAN) 4 MG tablet Take 1 tablet (4 mg total) by mouth every 6 (six) hours as needed for nausea. 20 tablet 0  . prochlorperazine (COMPAZINE) 10 MG  tablet Take 1 tablet (10 mg total) by mouth every 6 (six) hours as needed (Nausea or vomiting). 30 tablet 1  . rivaroxaban (XARELTO) 20 MG TABS tablet Take 1 tablet (20 mg total) by mouth daily with supper.    . rosuvastatin (CRESTOR) 10 MG tablet Take 1 tablet (10 mg total) by mouth daily. 30 tablet 0  . dexamethasone (DECADRON) 4 MG tablet Take 2 tablets (8 mg total) by mouth daily. Take daily x 2 days starting the day after cisplatin chemotherapy. Take with food. (Patient not taking: Reported on 09/25/2020) 30 tablet 1   No current facility-administered medications for this visit.     PHYSICAL EXAMINATION: ECOG PERFORMANCE STATUS: 2 - Symptomatic,  <50% confined to bed Vitals:   09/25/20 0852  BP: 107/68  Pulse: 92  Resp: 20  Temp: 98.7 F (37.1 C)   Filed Weights   09/25/20 0852  Weight: 241 lb (109.3 kg)    Physical Exam Constitutional:      General: She is not in acute distress.    Appearance: She is obese.  HENT:     Head: Normocephalic and atraumatic.  Eyes:     General: No scleral icterus. Cardiovascular:     Rate and Rhythm: Normal rate and regular rhythm.     Heart sounds: Normal heart sounds.  Pulmonary:     Effort: Pulmonary effort is normal. No respiratory distress.     Breath sounds: No wheezing.  Abdominal:     General: Bowel sounds are normal. There is no distension.     Palpations: Abdomen is soft.  Musculoskeletal:        General: No deformity. Normal range of motion.     Cervical back: Normal range of motion and neck supple.  Skin:    General: Skin is warm and dry.     Coloration: Skin is pale.     Findings: No erythema or rash.  Neurological:     Mental Status: She is alert and oriented to person, place, and time. Mental status is at baseline.     Cranial Nerves: No cranial nerve deficit.     Coordination: Coordination normal.  Psychiatric:        Mood and Affect: Mood normal.     LABORATORY DATA:  I have reviewed the data as listed Lab Results  Component Value Date   WBC 9.6 09/25/2020   HGB 9.2 (L) 09/25/2020   HCT 31.4 (L) 09/25/2020   MCV 81.1 09/25/2020   PLT 443 (H) 09/25/2020   Recent Labs    08/24/20 0643 09/05/20 2119 09/06/20 0415 09/16/20 0920 09/20/20 0327 09/25/20 0835  NA 136 134*   < > 137 134* 135  K 4.0 4.0   < > 4.2 4.3 4.1  CL 104 102   < > 104 103 99  CO2 25 26   < > 25 23 26   GLUCOSE 148* 108*   < > 142* 105* 155*  BUN 16 11   < > 9 9 14   CREATININE 0.86 0.69   < > 0.79 0.70 0.93  CALCIUM 8.6* 9.0   < > 9.1 8.5* 9.1  GFRNONAA >60 >60   < > >60 >60 >60  PROT 6.3* 7.5  --   --   --  7.1  ALBUMIN 3.0* 3.5  --   --   --  3.2*  AST 19 20  --   --   --   178*  ALT 12 10  --   --   --  195*  ALKPHOS 61 56  --   --   --  40  BILITOT 1.3* 1.4*  --   --   --  0.8   < > = values in this interval not displayed.   Iron/TIBC/Ferritin/ %Sat    Component Value Date/Time   IRON 17 (L) 08/24/2020 1311   TIBC 370 08/24/2020 1311   FERRITIN 12 08/24/2020 1311   IRONPCTSAT 5 (L) 08/24/2020 1311      RADIOGRAPHIC STUDIES: I have personally reviewed the radiological images as listed and agreed with the findings in the report. US PELVIC COMPLETE WITH TRANSVAGINAL  Result Date: 09/06/2020 CLINICAL DATA:  Postmenopausal bleeding EXAM: TRANSABDOMINAL AND TRANSVAGINAL ULTRASOUND OF PELVIS TECHNIQUE: Both transabdominal and transvaginal ultrasound examinations of the pelvis were performed. Transabdominal technique was performed for global imaging of the pelvis including uterus, ovaries, adnexal regions, and pelvic cul-de-sac. It was necessary to proceed with endovaginal exam following the transabdominal exam to visualize the ovaries. COMPARISON:  None FINDINGS: Uterus Measurements: 10.4 x 5.2 x 5.9 cm = volume: 166 mL. No fibroids or other mass visualized. Endometrium Thickness: 29. The endometrium is diffusely heterogeneous and thickened with increased vascularity. Right ovary Not visualized Left ovary Not visualized Other findings No abnormal free fluid. IMPRESSION: 1. Diffusely thickened and heterogeneous appearance of the endometrium is concerning for endometrial carcinoma. Gynecologic follow-up is recommended. 2. Neither ovary was visualized. Electronically Signed   By: Constance Holster M.D.   On: 09/06/2020 01:28      ASSESSMENT & PLAN:  1. Endometrial cancer ([place])   2. Iron deficiency anemia due to chronic blood loss   3. Acute blood loss anemia    #either at least stage II endometrial cancer with cervical involvement or cervical cancer.  PET scan for staging. Labs are reviewed and discussed with patient Overall her performance status is not good.   Multiple medical comorbidities. I will hold off chemotherapy and wait for PET scan to provide additional information.  She agrees with the plan.   #Iron deficiency anemia due to blood loss. Status post 1 IV Venofer treatments and she tolerated well.  Patient will proceed with IV Venofer today.  Plan total 4 Venofer treatments.  She will follow up with me in 1 week to discuss about PET scan and chemotherapy plan.  She agrees.  All questions were answered. The patient knows to call the clinic with any problems questions or concerns.   Gladstone Lighter, MD    Return of visit:  Thank you for this kind referral and the opportunity to participate in the care of this patient. A copy of today's note is routed to referring provider    Earlie Server, MD, PhD Hematology Oncology [facility] at [facility] Pager- [phone] 09/25/2020

## 2020-09-25 NOTE — Progress Notes (Signed)
Patient denies new problems/concerns today.   °

## 2020-09-25 NOTE — Progress Notes (Signed)
No chemo today per Nira Conn per Dr. Tasia Catchings.  Pt tolerated IV venofer infusion well with no complications.  Pt left infusion suite stable in a wheelchair.

## 2020-09-26 ENCOUNTER — Ambulatory Visit: Payer: Medicare HMO

## 2020-09-26 ENCOUNTER — Ambulatory Visit
Admission: RE | Admit: 2020-09-26 | Discharge: 2020-09-26 | Disposition: A | Discharge status: Home / Self Care | Payer: Medicare HMO | Source: Ambulatory Visit | Attending: Radiation Oncology | Admitting: Radiation Oncology

## 2020-09-26 DIAGNOSIS — Z510 Encounter for antineoplastic radiation therapy: Secondary | ICD-10-CM | POA: Diagnosis not present

## 2020-09-26 DIAGNOSIS — C541 Malignant neoplasm of endometrium: Secondary | ICD-10-CM | POA: Diagnosis not present

## 2020-09-27 ENCOUNTER — Ambulatory Visit
Admission: RE | Admit: 2020-09-27 | Discharge: 2020-09-27 | Disposition: A | Discharge status: Home / Self Care | Payer: Medicare HMO | Source: Ambulatory Visit | Attending: Radiation Oncology | Admitting: Radiation Oncology

## 2020-09-27 DIAGNOSIS — Z510 Encounter for antineoplastic radiation therapy: Secondary | ICD-10-CM | POA: Diagnosis not present

## 2020-09-27 DIAGNOSIS — C541 Malignant neoplasm of endometrium: Secondary | ICD-10-CM | POA: Diagnosis not present

## 2020-09-28 ENCOUNTER — Other Ambulatory Visit: Payer: Self-pay

## 2020-09-28 ENCOUNTER — Ambulatory Visit
Admission: RE | Admit: 2020-09-28 | Discharge: 2020-09-28 | Disposition: A | Discharge status: Home / Self Care | Payer: Medicare HMO | Source: Ambulatory Visit | Attending: Radiation Oncology | Admitting: Radiation Oncology

## 2020-09-28 ENCOUNTER — Encounter
Admission: RE | Admit: 2020-09-28 | Discharge: 2020-09-28 | Disposition: A | Discharge status: Home / Self Care | Payer: Medicare HMO | Source: Ambulatory Visit | Attending: Obstetrics and Gynecology | Admitting: Obstetrics and Gynecology

## 2020-09-28 DIAGNOSIS — I700 Atherosclerosis of aorta: Secondary | ICD-10-CM | POA: Insufficient documentation

## 2020-09-28 DIAGNOSIS — C541 Malignant neoplasm of endometrium: Secondary | ICD-10-CM | POA: Diagnosis not present

## 2020-09-28 LAB — GLUCOSE, CAPILLARY
Glucose-Capillary: 117 mg/dL — ABNORMAL HIGH (ref 70–99)
Glucose-Capillary: 117 mg/dL — ABNORMAL HIGH (ref 70–99)

## 2020-09-28 MED ORDER — FLUDEOXYGLUCOSE F - 18 (FDG) INJECTION
12.6000 | Freq: Once | INTRAVENOUS | Status: AC | PRN
  Administered 2020-09-28: 12.6 via INTRAVENOUS

## 2020-09-29 ENCOUNTER — Ambulatory Visit: Payer: Medicare HMO

## 2020-09-29 ENCOUNTER — Inpatient Hospital Stay: Payer: Medicare HMO

## 2020-10-02 ENCOUNTER — Ambulatory Visit
Admission: RE | Admit: 2020-10-02 | Discharge: 2020-10-02 | Disposition: A | Discharge status: Home / Self Care | Payer: Medicare HMO | Source: Ambulatory Visit | Attending: Radiation Oncology | Admitting: Radiation Oncology

## 2020-10-02 DIAGNOSIS — C541 Malignant neoplasm of endometrium: Secondary | ICD-10-CM | POA: Diagnosis not present

## 2020-10-02 DIAGNOSIS — Z510 Encounter for antineoplastic radiation therapy: Secondary | ICD-10-CM | POA: Diagnosis not present

## 2020-10-03 ENCOUNTER — Inpatient Hospital Stay (HOSPITAL_BASED_OUTPATIENT_CLINIC_OR_DEPARTMENT_OTHER): Payer: Medicare HMO | Admitting: Oncology

## 2020-10-03 ENCOUNTER — Ambulatory Visit
Admission: RE | Admit: 2020-10-03 | Discharge: 2020-10-03 | Disposition: A | Discharge status: Home / Self Care | Payer: Medicare HMO | Source: Ambulatory Visit | Attending: Radiation Oncology | Admitting: Radiation Oncology

## 2020-10-03 ENCOUNTER — Other Ambulatory Visit: Payer: Self-pay

## 2020-10-03 ENCOUNTER — Inpatient Hospital Stay: Payer: Medicare HMO

## 2020-10-03 ENCOUNTER — Encounter: Payer: Self-pay | Admitting: Oncology

## 2020-10-03 VITALS — BP 121/79 | HR 81 | Temp 98.2°F | Resp 18 | Wt 241.0 lb

## 2020-10-03 DIAGNOSIS — I2699 Other pulmonary embolism without acute cor pulmonale: Secondary | ICD-10-CM | POA: Diagnosis not present

## 2020-10-03 DIAGNOSIS — I82402 Acute embolism and thrombosis of unspecified deep veins of left lower extremity: Secondary | ICD-10-CM | POA: Diagnosis not present

## 2020-10-03 DIAGNOSIS — C541 Malignant neoplasm of endometrium: Secondary | ICD-10-CM

## 2020-10-03 DIAGNOSIS — I10 Essential (primary) hypertension: Secondary | ICD-10-CM | POA: Diagnosis not present

## 2020-10-03 DIAGNOSIS — R7401 Elevation of levels of liver transaminase levels: Secondary | ICD-10-CM | POA: Diagnosis not present

## 2020-10-03 DIAGNOSIS — N939 Abnormal uterine and vaginal bleeding, unspecified: Secondary | ICD-10-CM

## 2020-10-03 DIAGNOSIS — D500 Iron deficiency anemia secondary to blood loss (chronic): Secondary | ICD-10-CM | POA: Diagnosis not present

## 2020-10-03 DIAGNOSIS — Z510 Encounter for antineoplastic radiation therapy: Secondary | ICD-10-CM | POA: Diagnosis not present

## 2020-10-03 DIAGNOSIS — Z6841 Body Mass Index (BMI) 40.0 and over, adult: Secondary | ICD-10-CM | POA: Diagnosis not present

## 2020-10-03 DIAGNOSIS — E119 Type 2 diabetes mellitus without complications: Secondary | ICD-10-CM | POA: Diagnosis not present

## 2020-10-03 LAB — COMPREHENSIVE METABOLIC PANEL
ALT: 50 U/L — ABNORMAL HIGH (ref 0–44)
AST: 49 U/L — ABNORMAL HIGH (ref 15–41)
Albumin: 3.3 g/dL — ABNORMAL LOW (ref 3.5–5.0)
Alkaline Phosphatase: 34 U/L — ABNORMAL LOW (ref 38–126)
Anion gap: 10 (ref 5–15)
BUN: 10 mg/dL (ref 8–23)
CO2: 22 mmol/L (ref 22–32)
Calcium: 8.7 mg/dL — ABNORMAL LOW (ref 8.9–10.3)
Chloride: 103 mmol/L (ref 98–111)
Creatinine, Ser: 0.83 mg/dL (ref 0.44–1.00)
GFR, Estimated: >60 mL/min (ref >60)
Glucose, Bld: 123 mg/dL — ABNORMAL HIGH (ref 70–99)
Potassium: 4.1 mmol/L (ref 3.5–5.1)
Sodium: 135 mmol/L (ref 135–145)
Total Bilirubin: 1 mg/dL (ref 0.3–1.2)
Total Protein: 6.9 g/dL (ref 6.5–8.1)

## 2020-10-03 LAB — CBC WITH DIFFERENTIAL/PLATELET
Abs Immature Granulocytes: 0.05 10*3/uL (ref 0.00–0.07)
Basophils Absolute: 0.1 10*3/uL (ref 0.0–0.1)
Basophils Relative: 1 %
Eosinophils Absolute: 0.1 10*3/uL (ref 0.0–0.5)
Eosinophils Relative: 2 %
HCT: 31 % — ABNORMAL LOW (ref 36.0–46.0)
Hemoglobin: 9.1 g/dL — ABNORMAL LOW (ref 12.0–15.0)
Immature Granulocytes: 1 %
Lymphocytes Relative: 20 %
Lymphs Abs: 1.2 10*3/uL (ref 0.7–4.0)
MCH: 24.9 pg — ABNORMAL LOW (ref 26.0–34.0)
MCHC: 29.4 g/dL — ABNORMAL LOW (ref 30.0–36.0)
MCV: 84.9 fL (ref 80.0–100.0)
Monocytes Absolute: 0.3 10*3/uL (ref 0.1–1.0)
Monocytes Relative: 5 %
Neutro Abs: 4.2 10*3/uL (ref 1.7–7.7)
Neutrophils Relative %: 71 %
Platelets: 295 10*3/uL (ref 150–400)
RBC: 3.65 MIL/uL — ABNORMAL LOW (ref 3.87–5.11)
RDW: 24.2 % — ABNORMAL HIGH (ref 11.5–15.5)
WBC: 5.9 10*3/uL (ref 4.0–10.5)
nRBC: 0 % (ref 0.0–0.2)

## 2020-10-03 LAB — MAGNESIUM: Magnesium: 2.1 mg/dL (ref 1.7–2.4)

## 2020-10-03 MED ORDER — METFORMIN HCL 500 MG PO TABS: 500.0000 mg | ORAL_TABLET | Freq: Two times a day (BID) | ORAL | 0 refills | Status: DC

## 2020-10-03 MED ORDER — ROSUVASTATIN CALCIUM 10 MG PO TABS: 10.0000 mg | ORAL_TABLET | Freq: Every day | ORAL | 0 refills | Status: DC

## 2020-10-03 MED ORDER — RIVAROXABAN 20 MG PO TABS: 20.0000 mg | ORAL_TABLET | Freq: Every day | ORAL | 2 refills | Status: DC

## 2020-10-03 MED ORDER — SODIUM CHLORIDE 0.9 % IV SOLN
Freq: Once | INTRAVENOUS | Status: AC
  Filled 2020-10-03: qty 250

## 2020-10-03 MED ORDER — LOSARTAN POTASSIUM 25 MG PO TABS: 25.0000 mg | ORAL_TABLET | Freq: Every day | ORAL | 0 refills | Status: DC

## 2020-10-03 MED ORDER — SODIUM CHLORIDE 0.9 % IV SOLN
200.0000 mg | Freq: Once | INTRAVENOUS | Status: DC
  Filled 2020-10-03: qty 10

## 2020-10-03 MED ORDER — IRON SUCROSE 20 MG/ML IV SOLN
200.0000 mg | Freq: Once | INTRAVENOUS | Status: AC
  Administered 2020-10-03: 200 mg via INTRAVENOUS
  Filled 2020-10-03: qty 10

## 2020-10-03 NOTE — Progress Notes (Signed)
Hematology/Oncology  Follow up note [facility] [phone] [phone]   Patient Care Team: Gladstone Lighter, MD as PCP - General (Internal Medicine) Clent Jacks, RN as Oncology Nurse Navigator  REFERRING PROVIDER: Gladstone Lighter, MD  CHIEF COMPLAINTS/REASON FOR VISIT:   endometrial cancer, iron deficiency anemia  HISTORY OF PRESENTING ILLNESS:   Courtney Mack is a  65 y.o.  female with PMH listed below was seen in consultation at the request of  Gladstone Lighter, MD  for evaluation of endometrial cancer.  #February 2022, patient was hospitalized to due to left lower extremity DVT, extensive bilateral PE, status post embolectomy and IVC filter placement.  Patient was discharged on anticoagulation with Xarelto. Patient reports that she has had intermittent vaginal bleeding for the past few years.  After she was started on Xarelto, she started to experience frequent vaginal bleeding 09/06/2020, US pelvic complete showed a diffusely thickened and heterogeneous appearance of the endometrium, concerning for endometrial carcinoma.-  Uterus: Measurements: 10.4 x 5.2 x 5.9 cm = volume: 166 mL. No fibroids or other mass visualized. Endometrium Thickness: 29. The endometrium is diffusely heterogeneous and thickened with increased vascularity.Right and left ovaries: Not visualized.  No abnormal free fluid  Patient was evaluated by gynecology Dr. Amalia Hailey.  On physical examination, patient had malodorous friable necrotic appearing cervix was seen. Cervical biopsy performed. An endometrial biopsy was attempted but the cervical os was not well visualized.   # 09/06/2020 Cervical and endometrial biopsies A. UTERINE CERVIX; BIOPSY:   ENDOMETRIOID ENDOMETRIAL ADENOCARCINOMA, FAVOR LOW GRADE (FIGO GRADE 1-2).   B. ENDOMETRIUM; BIOPSY: - ENDOMETRIOID ENDOMETRIAL ADENOCARCINOMA, FAVOR LOW GRADE (FIGO GRADE 1-2).  She presents for evaluation. She  continues to have bleeding.  Patient was seen by gynecology oncology Dr. Theora Gianotti on 09/13/2020.  Patient is not a surgical candidate.  Refer to radiation oncology and my dog for further evaluation.  Patient presented to emergency room on 09/16/2020, 09/20/2020 for vaginal bleeding and passing clots.. 09/16/2020, hemoglobin 9.7, MCV 80 09/20/2020, hemoglobin 8.8, MCV 80.5. 08/24/2020, iron saturation 5, ferritin 12.  Today patient has been seen by radiation oncology.  PET scan has been scheduled on 09/28/2020. Due to the ongoing bleeding, decision was made to proceed with radiation on 09/25/2020. She reports very tired and fatigued.  Ongoing vaginal bleeding and passing blood clots..  Denies any chest pain, shortness of breath.Some left lower abdomen discomfort.  Chronic other medical problems include hypertension, morbid obesity, diabetes, chronic anticoagulation for recent bilateral extensive PE and a DVT.   INTERVAL HISTORY Courtney Mack is a 65 y.o. female who has above history reviewed by me today presents for follow up visit for management of endometrial cancer Problems and complaints are listed below: Patient received 2 IV Venofer treatments.  Slightly better energy level. Continues to have intermittent vaginal bleeding.  Patient is on radiation. She ran out of all her regular medication including Xarelto.  Last Xarelto was 2 days ago Denies any worsening of breathing.  She does not have primary care provider.  She has [facility] and was told to call back on 4/1 to schedule appointment Still fatigued.   Review of Systems  Constitutional: Positive for fatigue. Negative for appetite change, chills and fever.  HENT:   Negative for hearing loss and voice change.   Eyes: Negative for eye problems.  Respiratory: Negative for chest tightness and cough.   Cardiovascular: Positive for leg swelling. Negative for chest pain.  Gastrointestinal: Negative  for abdominal  distention, abdominal pain and blood in stool.  Endocrine: Negative for hot flashes.  Genitourinary: Positive for vaginal bleeding. Negative for difficulty urinating and frequency.   Musculoskeletal: Negative for arthralgias.  Skin: Negative for itching and rash.  Neurological: Negative for extremity weakness.  Hematological: Negative for adenopathy.  Psychiatric/Behavioral: Negative for confusion.      MEDICAL HISTORY:  Past Medical History:  Diagnosis Date  . Diabetes mellitus without complication ([place])   . DVT (deep venous thrombosis) ([place]) 09/06/2020  . High cholesterol   . Hx of blood clots   . Hypertension   . IDA (iron deficiency anemia) 09/21/2020    SURGICAL HISTORY: Past Surgical History:  Procedure Laterality Date  . IVC FILTER INSERTION N/A 08/18/2020   Procedure: IVC FILTER INSERTION;  Surgeon: Katha Cabal, MD;  Location: [facility];  Service: Cardiovascular;  Laterality: N/A;  . PULMONARY THROMBECTOMY N/A 08/18/2020   Procedure: PULMONARY THROMBECTOMY;  Surgeon: Katha Cabal, MD;  Location: [facility];  Service: Cardiovascular;  Laterality: N/A;  . TUBAL LIGATION      SOCIAL HISTORY: Social History   Socioeconomic History  . Marital status: Single    Spouse name: Not on file  . Number of children: Not on file  . Years of education: Not on file  . Highest education level: Not on file  Occupational History  . Not on file  Tobacco Use  . Smoking status: Former Smoker    Packs/day: 0.25    Years: 0.50    Pack years: 0.12    Types: Cigarettes    Quit date: 09/21/1980    Years since quitting: 40.0  . Smokeless tobacco: Never Used  . Tobacco comment: smoked 2-4 cigarettes for about 2-3 weeks   Vaping Use  . Vaping Use: Never used  Substance and Sexual Activity  . Alcohol use: Not Currently  . Drug use: Not Currently  . Sexual activity: Not on file  Other Topics Concern  . Not on file  Social History Narrative  . Not on  file   Social Determinants of Health   Financial Resource Strain: Not on file  Food Insecurity: Not on file  Transportation Needs: Not on file  Physical Activity: Not on file  Stress: Not on file  Social Connections: Not on file  Intimate Partner Violence: Not on file    FAMILY HISTORY: Family History  Problem Relation Age of Onset  . Hypertension Mother   . Diabetes Mother   . Diabetes Father   . Hypertension Father   . High Cholesterol Father   . Congestive Heart Failure Father     ALLERGIES:  has No Known Allergies.  MEDICATIONS:  Current Outpatient Medications  Medication Sig Dispense Refill  . Ensure Max Protein (ENSURE MAX PROTEIN) LIQD Take 330 mLs (11 oz total) by mouth 2 (two) times daily.    . Fluticasone-Salmeterol (ADVAIR) 100-50 MCG/DOSE AEPB Inhale 1 puff into the lungs 2 (two) times daily. Rinse mouth after each use    . Multiple Vitamin (MULTIVITAMIN WITH MINERALS) TABS tablet Take 1 tablet by mouth daily.    . norethindrone (AYGESTIN) 5 MG tablet Take 1 tablet (5 mg total) by mouth 3 (three) times daily.    Marland Kitchen nystatin (MYCOSTATIN/NYSTOP) powder Apply topically 2 (two) times daily. 15 g 0  . ondansetron (ZOFRAN) 4 MG tablet Take 1 tablet (4 mg total) by mouth every 6 (six) hours as needed for nausea. 20 tablet 0  .  prochlorperazine (COMPAZINE) 10 MG tablet Take 1 tablet (10 mg total) by mouth every 6 (six) hours as needed (Nausea or vomiting). 30 tablet 1  . dexamethasone (DECADRON) 4 MG tablet Take 2 tablets (8 mg total) by mouth daily. Take daily x 2 days starting the day after cisplatin chemotherapy. Take with food. (Patient not taking: No sig reported) 30 tablet 1  . losartan (COZAAR) 25 MG tablet Take 1 tablet (25 mg total) by mouth daily. 15 tablet 0  . metFORMIN (GLUCOPHAGE) 500 MG tablet Take 1 tablet (500 mg total) by mouth 2 (two) times daily with a meal. 30 tablet 0  . rivaroxaban (XARELTO) 20 MG TABS tablet Take 1 tablet (20 mg total) by mouth daily  with supper. 30 tablet 2  . rosuvastatin (CRESTOR) 10 MG tablet Take 1 tablet (10 mg total) by mouth daily. 15 tablet 0   No current facility-administered medications for this visit.     PHYSICAL EXAMINATION: ECOG PERFORMANCE STATUS: 2 - Symptomatic, <50% confined to bed Vitals:   10/03/20 0902  BP: 121/79  Pulse: 81  Resp: 18  Temp: 98.2 F (36.8 C)   Filed Weights   10/03/20 0902  Weight: 241 lb (109.3 kg)    Physical Exam Constitutional:      General: She is not in acute distress.    Appearance: She is obese.  HENT:     Head: Normocephalic and atraumatic.  Eyes:     General: No scleral icterus. Cardiovascular:     Rate and Rhythm: Normal rate and regular rhythm.     Heart sounds: Normal heart sounds.  Pulmonary:     Effort: Pulmonary effort is normal. No respiratory distress.     Breath sounds: No wheezing.  Abdominal:     General: Bowel sounds are normal. There is no distension.     Palpations: Abdomen is soft.  Musculoskeletal:        General: No deformity. Normal range of motion.     Cervical back: Normal range of motion and neck supple.  Skin:    General: Skin is warm and dry.     Coloration: Skin is pale.     Findings: No erythema or rash.  Neurological:     Mental Status: She is alert and oriented to person, place, and time. Mental status is at baseline.     Cranial Nerves: No cranial nerve deficit.     Coordination: Coordination normal.  Psychiatric:        Mood and Affect: Mood normal.     LABORATORY DATA:  I have reviewed the data as listed Lab Results  Component Value Date   WBC 5.9 10/03/2020   HGB 9.1 (L) 10/03/2020   HCT 31.0 (L) 10/03/2020   MCV 84.9 10/03/2020   PLT 295 10/03/2020   Recent Labs    09/05/20 2119 09/06/20 0415 09/20/20 0327 09/25/20 0835 10/03/20 0835  NA 134*   < > 134* 135 135  K 4.0   < > 4.3 4.1 4.1  CL 102   < > 103 99 103  CO2 26   < > 23 26 22   GLUCOSE 108*   < > 105* 155* 123*  BUN 11   < > 9 14 10    CREATININE 0.69   < > 0.70 0.93 0.83  CALCIUM 9.0   < > 8.5* 9.1 8.7*  GFRNONAA >60   < > >60 >60 >60  PROT 7.5  --   --  7.1 6.9  ALBUMIN  3.5  --   --  3.2* 3.3*  AST 20  --   --  178* 49*  ALT 10  --   --  195* 50*  ALKPHOS 56  --   --  40 34*  BILITOT 1.4*  --   --  0.8 1.0   < > = values in this interval not displayed.   Iron/TIBC/Ferritin/ %Sat    Component Value Date/Time   IRON 17 (L) 08/24/2020 1311   TIBC 370 08/24/2020 1311   FERRITIN 12 08/24/2020 1311   IRONPCTSAT 5 (L) 08/24/2020 1311      RADIOGRAPHIC STUDIES: I have personally reviewed the radiological images as listed and agreed with the findings in the report. NM PET Image Initial (PI) Skull Base To Thigh  Result Date: 09/28/2020 CLINICAL DATA:  Initial treatment strategy for endometrial cancer, initial staging. Last COVID shot in left arm 08/25/2020. Two radiation treatments to abdomen this week. EXAM: NUCLEAR MEDICINE PET SKULL BASE TO THIGH TECHNIQUE: 12.6 mCi F-18 FDG was injected intravenously. Full-ring PET imaging was performed from the skull base to thigh after the radiotracer. CT data was obtained and used for attenuation correction and anatomic localization. Fasting blood glucose: 117 mg/dl COMPARISON:  Pelvic ultrasound 09/06/2020.  CT a chest 08/18/2020. FINDINGS: Mediastinal blood pool activity: SUV max 3.3 Liver activity: SUV max NA NECK: No areas of abnormal hypermetabolism. Incidental CT findings: No cervical adenopathy. Bilateral carotid atherosclerosis. Left maxillary sinus mucosal thickening. CHEST: No pulmonary parenchymal or thoracic nodal hypermetabolism. Incidental CT findings: Aortic atherosclerosis.  Mild cardiomegaly. ABDOMEN/PELVIS: No abdominal parenchymal or nodal hypermetabolism. Diffuse central uterine hypermetabolism consistent with the clinical history. Example at a S.U.V. max of 22.2. No pelvic nodal hypermetabolism. 2 foci of anorectal hypermetabolism. More cephalad high rectal  hypermetabolism is separate from the enlarged uterus, including at a S.U.V. max of 8.8. No well-defined mass, including on approximately 207/3. Separate focus of hypermetabolism within the anus is also without CT correlate at a S.U.V. max of 12.1. There is a large amount of rectal stool. Incidental CT findings: Normal adrenal glands. Abdominal aortic atherosclerosis. Appropriate positioned IVC filter. Punctate upper pole right renal collecting system calculus. Colonic stool burden suggests constipation. SKELETON: No abnormal marrow activity. Incidental CT findings: Advanced osteoarthritis of both hips. IMPRESSION: 1. Endometrial primary, without findings of hypermetabolic metastatic disease. 2. Foci of anorectal hypermetabolism are indeterminate, without well-defined CT correlate. Correlate with colon cancer screening history. If not up-to-date, consider colonoscopy. 3. Incidental findings, including: Aortic Atherosclerosis (ICD10-I70.0). Sinus disease. Stool burden suggesting constipation or even fecal impaction. Electronically Signed   By: Abigail Miyamoto M.D.   On: 09/28/2020 16:35   US PELVIC COMPLETE WITH TRANSVAGINAL  Result Date: 09/06/2020 CLINICAL DATA:  Postmenopausal bleeding EXAM: TRANSABDOMINAL AND TRANSVAGINAL ULTRASOUND OF PELVIS TECHNIQUE: Both transabdominal and transvaginal ultrasound examinations of the pelvis were performed. Transabdominal technique was performed for global imaging of the pelvis including uterus, ovaries, adnexal regions, and pelvic cul-de-sac. It was necessary to proceed with endovaginal exam following the transabdominal exam to visualize the ovaries. COMPARISON:  None FINDINGS: Uterus Measurements: 10.4 x 5.2 x 5.9 cm = volume: 166 mL. No fibroids or other mass visualized. Endometrium Thickness: 29. The endometrium is diffusely heterogeneous and thickened with increased vascularity. Right ovary Not visualized Left ovary Not visualized Other findings No abnormal free fluid.  IMPRESSION: 1. Diffusely thickened and heterogeneous appearance of the endometrium is concerning for endometrial carcinoma. Gynecologic follow-up is recommended. 2. Neither ovary was visualized. Electronically Signed  By: Constance Holster M.D.   On: 09/06/2020 01:28      ASSESSMENT & PLAN:  1. Endometrial cancer ([place])   2. Iron deficiency anemia due to chronic blood loss   3. Transaminitis   4. Vaginal bleeding    #Stage II endometrial cancer with cervical involvement or cervical cancer.  09/28/2020, PET scan showed endometrial primary, without findings of hypermetabolic metastasis. Foci of anorectal hypermetabolic area are indeterminate.  No CT correlate. Patient will need colonoscopy in the near future.  Patient has poor performance status and multiple medical morbidities.   Recommend continue radiation. Since she is not a candidate for surgery, may benefit from brachytherapy.  Defer to radiation oncology. Discussed with Dr.Berchuk.   Transaminitis improved.  #Iron deficiency anemia due to blood loss. Proceed with Venofer 200 mg x 3  Hemoglobin remained stable without further improvement All questions were answered. The patient knows to call the clinic with any problems questions or concerns.   Return of visit: 6 weeks.    Earlie Server, MD, PhD Hematology Oncology [facility] at [facility] Pager- [phone] 10/03/2020

## 2020-10-03 NOTE — Progress Notes (Signed)
Patient denies new problems/concerns today.   °

## 2020-10-04 ENCOUNTER — Ambulatory Visit: Payer: Medicare HMO

## 2020-10-04 ENCOUNTER — Ambulatory Visit
Admission: RE | Admit: 2020-10-04 | Discharge: 2020-10-04 | Disposition: A | Discharge status: Home / Self Care | Payer: Medicare HMO | Source: Ambulatory Visit | Attending: Radiation Oncology | Admitting: Radiation Oncology

## 2020-10-04 DIAGNOSIS — C541 Malignant neoplasm of endometrium: Secondary | ICD-10-CM | POA: Diagnosis not present

## 2020-10-04 DIAGNOSIS — Z510 Encounter for antineoplastic radiation therapy: Secondary | ICD-10-CM | POA: Diagnosis not present

## 2020-10-05 ENCOUNTER — Ambulatory Visit
Admission: RE | Admit: 2020-10-05 | Discharge: 2020-10-05 | Disposition: A | Discharge status: Home / Self Care | Payer: Medicare HMO | Source: Ambulatory Visit | Attending: Radiation Oncology | Admitting: Radiation Oncology

## 2020-10-05 ENCOUNTER — Telehealth: Payer: Self-pay

## 2020-10-05 DIAGNOSIS — D62 Acute posthemorrhagic anemia: Secondary | ICD-10-CM | POA: Diagnosis not present

## 2020-10-05 DIAGNOSIS — C541 Malignant neoplasm of endometrium: Secondary | ICD-10-CM | POA: Diagnosis not present

## 2020-10-05 DIAGNOSIS — Z48812 Encounter for surgical aftercare following surgery on the circulatory system: Secondary | ICD-10-CM | POA: Diagnosis not present

## 2020-10-05 DIAGNOSIS — Z510 Encounter for antineoplastic radiation therapy: Secondary | ICD-10-CM | POA: Diagnosis not present

## 2020-10-05 DIAGNOSIS — Z95828 Presence of other vascular implants and grafts: Secondary | ICD-10-CM | POA: Diagnosis not present

## 2020-10-05 NOTE — Telephone Encounter (Signed)
Done..  All pts appts has been sched as requested.

## 2020-10-05 NOTE — Telephone Encounter (Signed)
Pt informed. Please schedule additional iron infusion next week & lab/MD/ poss venofer in 6 weeks (with labs 1-2 days prior). Pt will pick up updated AVS today when she comes for radiation.

## 2020-10-05 NOTE — Telephone Encounter (Signed)
-----   Message from Earlie Server, MD sent at 10/04/2020 10:20 PM EDT ----- Please let patient know that I have discussed with gynonc and we think she does not need chemotherapy. Finish her radiation. Please arrange her to have one more venofer next week in additional to the currently scheduled treatments.  Follow up with me, labs in 6 weeks- ordered. Prior to MD visit and + venofer.

## 2020-10-06 ENCOUNTER — Ambulatory Visit
Admission: RE | Admit: 2020-10-06 | Discharge: 2020-10-06 | Disposition: A | Discharge status: Home / Self Care | Payer: Medicare HMO | Source: Ambulatory Visit | Attending: Radiation Oncology | Admitting: Radiation Oncology

## 2020-10-06 ENCOUNTER — Inpatient Hospital Stay: Payer: Medicare HMO | Attending: Oncology

## 2020-10-06 VITALS — BP 123/57 | HR 65 | Temp 96.8°F | Resp 20

## 2020-10-06 DIAGNOSIS — C541 Malignant neoplasm of endometrium: Secondary | ICD-10-CM | POA: Diagnosis not present

## 2020-10-06 DIAGNOSIS — Z510 Encounter for antineoplastic radiation therapy: Secondary | ICD-10-CM | POA: Insufficient documentation

## 2020-10-06 DIAGNOSIS — D500 Iron deficiency anemia secondary to blood loss (chronic): Secondary | ICD-10-CM

## 2020-10-06 DIAGNOSIS — D509 Iron deficiency anemia, unspecified: Secondary | ICD-10-CM | POA: Insufficient documentation

## 2020-10-06 MED ORDER — SODIUM CHLORIDE 0.9 % IV SOLN: 200.0000 mg | Freq: Once | INTRAVENOUS | Status: DC

## 2020-10-06 MED ORDER — IRON SUCROSE 20 MG/ML IV SOLN
200.0000 mg | Freq: Once | INTRAVENOUS | Status: AC
  Administered 2020-10-06: 200 mg via INTRAVENOUS
  Filled 2020-10-06: qty 10

## 2020-10-06 MED ORDER — SODIUM CHLORIDE 0.9 % IV SOLN
Freq: Once | INTRAVENOUS | Status: AC
  Filled 2020-10-06: qty 250

## 2020-10-09 ENCOUNTER — Ambulatory Visit
Admission: RE | Admit: 2020-10-09 | Discharge: 2020-10-09 | Disposition: A | Discharge status: Home / Self Care | Payer: Medicare HMO | Source: Ambulatory Visit | Attending: Radiation Oncology | Admitting: Radiation Oncology

## 2020-10-09 DIAGNOSIS — C541 Malignant neoplasm of endometrium: Secondary | ICD-10-CM | POA: Diagnosis not present

## 2020-10-09 DIAGNOSIS — Z510 Encounter for antineoplastic radiation therapy: Secondary | ICD-10-CM | POA: Diagnosis not present

## 2020-10-10 ENCOUNTER — Other Ambulatory Visit: Payer: Self-pay

## 2020-10-10 ENCOUNTER — Ambulatory Visit
Admission: RE | Admit: 2020-10-10 | Discharge: 2020-10-10 | Disposition: A | Discharge status: Home / Self Care | Payer: Medicare HMO | Source: Ambulatory Visit | Attending: Radiation Oncology | Admitting: Radiation Oncology

## 2020-10-10 DIAGNOSIS — C541 Malignant neoplasm of endometrium: Secondary | ICD-10-CM | POA: Diagnosis not present

## 2020-10-10 DIAGNOSIS — Z510 Encounter for antineoplastic radiation therapy: Secondary | ICD-10-CM | POA: Diagnosis not present

## 2020-10-11 ENCOUNTER — Ambulatory Visit
Admission: RE | Admit: 2020-10-11 | Discharge: 2020-10-11 | Disposition: A | Discharge status: Home / Self Care | Payer: Medicare HMO | Source: Ambulatory Visit | Attending: Radiation Oncology | Admitting: Radiation Oncology

## 2020-10-11 ENCOUNTER — Other Ambulatory Visit: Payer: Self-pay | Admitting: *Deleted

## 2020-10-11 DIAGNOSIS — Z510 Encounter for antineoplastic radiation therapy: Secondary | ICD-10-CM | POA: Diagnosis not present

## 2020-10-11 DIAGNOSIS — C541 Malignant neoplasm of endometrium: Secondary | ICD-10-CM | POA: Diagnosis not present

## 2020-10-11 MED ORDER — KETOCONAZOLE 2 % EX CREA: 1.0000 "application " | TOPICAL_CREAM | Freq: Every day | CUTANEOUS | 1 refills | Status: DC

## 2020-10-12 ENCOUNTER — Ambulatory Visit
Admission: RE | Admit: 2020-10-12 | Discharge: 2020-10-12 | Disposition: A | Discharge status: Home / Self Care | Payer: Medicare HMO | Source: Ambulatory Visit | Attending: Radiation Oncology | Admitting: Radiation Oncology

## 2020-10-12 DIAGNOSIS — Z510 Encounter for antineoplastic radiation therapy: Secondary | ICD-10-CM | POA: Diagnosis not present

## 2020-10-12 DIAGNOSIS — C541 Malignant neoplasm of endometrium: Secondary | ICD-10-CM | POA: Diagnosis not present

## 2020-10-13 ENCOUNTER — Ambulatory Visit
Admission: RE | Admit: 2020-10-13 | Discharge: 2020-10-13 | Disposition: A | Discharge status: Home / Self Care | Payer: Medicare HMO | Source: Ambulatory Visit | Attending: Radiation Oncology | Admitting: Radiation Oncology

## 2020-10-13 ENCOUNTER — Inpatient Hospital Stay: Payer: Medicare HMO

## 2020-10-13 VITALS — BP 124/57 | HR 84 | Temp 98.2°F | Resp 18

## 2020-10-13 DIAGNOSIS — C541 Malignant neoplasm of endometrium: Secondary | ICD-10-CM | POA: Diagnosis not present

## 2020-10-13 DIAGNOSIS — D500 Iron deficiency anemia secondary to blood loss (chronic): Secondary | ICD-10-CM

## 2020-10-13 DIAGNOSIS — D509 Iron deficiency anemia, unspecified: Secondary | ICD-10-CM | POA: Diagnosis not present

## 2020-10-13 DIAGNOSIS — Z510 Encounter for antineoplastic radiation therapy: Secondary | ICD-10-CM | POA: Diagnosis not present

## 2020-10-13 MED ORDER — IRON SUCROSE 20 MG/ML IV SOLN
200.0000 mg | Freq: Once | INTRAVENOUS | Status: AC
  Administered 2020-10-13: 200 mg via INTRAVENOUS
  Filled 2020-10-13: qty 10

## 2020-10-13 MED ORDER — SODIUM CHLORIDE 0.9 % IV SOLN: 200.0000 mg | Freq: Once | INTRAVENOUS | Status: DC

## 2020-10-13 MED ORDER — SODIUM CHLORIDE 0.9 % IV SOLN
Freq: Once | INTRAVENOUS | Status: AC
Start: 2020-10-13 — End: 2020-10-13
  Filled 2020-10-13: qty 250

## 2020-10-16 ENCOUNTER — Ambulatory Visit: Payer: Medicare HMO

## 2020-10-16 ENCOUNTER — Ambulatory Visit
Admission: RE | Admit: 2020-10-16 | Discharge: 2020-10-16 | Disposition: A | Discharge status: Home / Self Care | Payer: Medicare HMO | Source: Ambulatory Visit | Attending: Radiation Oncology | Admitting: Radiation Oncology

## 2020-10-16 DIAGNOSIS — Z510 Encounter for antineoplastic radiation therapy: Secondary | ICD-10-CM | POA: Diagnosis not present

## 2020-10-16 DIAGNOSIS — C541 Malignant neoplasm of endometrium: Secondary | ICD-10-CM | POA: Diagnosis not present

## 2020-10-17 ENCOUNTER — Ambulatory Visit: Payer: Medicare HMO

## 2020-10-17 ENCOUNTER — Ambulatory Visit
Admission: RE | Admit: 2020-10-17 | Discharge: 2020-10-17 | Disposition: A | Discharge status: Home / Self Care | Payer: Medicare HMO | Source: Ambulatory Visit | Attending: Radiation Oncology | Admitting: Radiation Oncology

## 2020-10-17 DIAGNOSIS — Z510 Encounter for antineoplastic radiation therapy: Secondary | ICD-10-CM | POA: Diagnosis not present

## 2020-10-17 DIAGNOSIS — C541 Malignant neoplasm of endometrium: Secondary | ICD-10-CM | POA: Diagnosis not present

## 2020-10-18 ENCOUNTER — Ambulatory Visit: Payer: Medicare HMO

## 2020-10-18 ENCOUNTER — Other Ambulatory Visit: Payer: Self-pay | Admitting: Oncology

## 2020-10-18 ENCOUNTER — Other Ambulatory Visit: Payer: Medicare HMO

## 2020-10-18 ENCOUNTER — Ambulatory Visit
Admission: RE | Admit: 2020-10-18 | Discharge: 2020-10-18 | Disposition: A | Discharge status: Home / Self Care | Payer: Medicare HMO | Source: Ambulatory Visit | Attending: Radiation Oncology | Admitting: Radiation Oncology

## 2020-10-18 DIAGNOSIS — C541 Malignant neoplasm of endometrium: Secondary | ICD-10-CM

## 2020-10-18 DIAGNOSIS — Z510 Encounter for antineoplastic radiation therapy: Secondary | ICD-10-CM | POA: Diagnosis not present

## 2020-10-18 NOTE — Progress Notes (Signed)
Tumor Board Documentation  Courtney Mack was presented by Dr. Tasia Catchings at our Tumor Board on 10/18/2020, which included representatives from medical oncology,radiation oncology,surgical oncology,pathology,navigation,palliative care.  Courtney Mack currently presents as a current patient,for new tumor(s),for MDC with history of the following treatments:  (Currently receiving external pelvic radiation for ongoing vaginal bleeding).  Additionally, we reviewed previous medical and familial history, history of present illness, and recent lab results along with all available histopathologic and imaging studies. The tumor board considered available treatment options and made the following recommendations: Radiation therapy (primary modality) Patient felt to not be a candidate for surgery nor chemotherapy. The recommendation was to continue external pelvic radiation then refer patient to Dr. Christel Mormon at [facility] for vaginal brachytherapy given complexity.,  The following procedures/referrals were also placed: No orders of the defined types were placed in this encounter.  Clinical Trial Status: not discussed   Staging used:    National site-specific guidelines NCCN (at least stage II) were discussed with respect to the case.  Tumor board is a meeting of clinicians from various specialty areas who evaluate and discuss patients for whom a multidisciplinary approach is being considered. Final determinations in the plan of care are those of the provider(s). The responsibility for follow up of recommendations given during tumor board is that of the provider.   Today's extended care, comprehensive team conference, Courtney Mack was not present for the discussion and was not examined.

## 2020-10-19 ENCOUNTER — Ambulatory Visit: Payer: Medicare HMO

## 2020-10-19 ENCOUNTER — Ambulatory Visit
Admission: RE | Admit: 2020-10-19 | Discharge: 2020-10-19 | Disposition: A | Discharge status: Home / Self Care | Payer: Medicare HMO | Source: Ambulatory Visit | Attending: Radiation Oncology | Admitting: Radiation Oncology

## 2020-10-19 DIAGNOSIS — C541 Malignant neoplasm of endometrium: Secondary | ICD-10-CM | POA: Diagnosis not present

## 2020-10-19 DIAGNOSIS — Z510 Encounter for antineoplastic radiation therapy: Secondary | ICD-10-CM | POA: Diagnosis not present

## 2020-10-20 ENCOUNTER — Ambulatory Visit: Payer: Medicare HMO

## 2020-10-20 ENCOUNTER — Ambulatory Visit
Admission: RE | Admit: 2020-10-20 | Discharge: 2020-10-20 | Disposition: A | Discharge status: Home / Self Care | Payer: Medicare HMO | Source: Ambulatory Visit | Attending: Radiation Oncology | Admitting: Radiation Oncology

## 2020-10-20 DIAGNOSIS — C541 Malignant neoplasm of endometrium: Secondary | ICD-10-CM | POA: Diagnosis not present

## 2020-10-20 DIAGNOSIS — Z510 Encounter for antineoplastic radiation therapy: Secondary | ICD-10-CM | POA: Diagnosis not present

## 2020-10-23 ENCOUNTER — Ambulatory Visit
Admission: RE | Admit: 2020-10-23 | Discharge: 2020-10-23 | Disposition: A | Discharge status: Home / Self Care | Payer: Medicare HMO | Source: Ambulatory Visit | Attending: Radiation Oncology | Admitting: Radiation Oncology

## 2020-10-23 ENCOUNTER — Ambulatory Visit: Payer: Medicare HMO

## 2020-10-23 DIAGNOSIS — C541 Malignant neoplasm of endometrium: Secondary | ICD-10-CM | POA: Diagnosis not present

## 2020-10-23 DIAGNOSIS — Z510 Encounter for antineoplastic radiation therapy: Secondary | ICD-10-CM | POA: Diagnosis not present

## 2020-10-24 ENCOUNTER — Ambulatory Visit: Payer: Medicare HMO

## 2020-10-24 ENCOUNTER — Ambulatory Visit
Admission: RE | Admit: 2020-10-24 | Discharge: 2020-10-24 | Disposition: A | Discharge status: Home / Self Care | Payer: Medicare HMO | Source: Ambulatory Visit | Attending: Radiation Oncology | Admitting: Radiation Oncology

## 2020-10-24 DIAGNOSIS — Z510 Encounter for antineoplastic radiation therapy: Secondary | ICD-10-CM | POA: Diagnosis not present

## 2020-10-24 DIAGNOSIS — C541 Malignant neoplasm of endometrium: Secondary | ICD-10-CM | POA: Diagnosis not present

## 2020-10-25 ENCOUNTER — Ambulatory Visit: Payer: Medicare HMO

## 2020-10-26 ENCOUNTER — Ambulatory Visit
Admission: RE | Admit: 2020-10-26 | Discharge: 2020-10-26 | Disposition: A | Discharge status: Home / Self Care | Payer: Medicare HMO | Source: Ambulatory Visit | Attending: Radiation Oncology | Admitting: Radiation Oncology

## 2020-10-26 ENCOUNTER — Ambulatory Visit: Payer: Medicare HMO

## 2020-10-26 DIAGNOSIS — Z510 Encounter for antineoplastic radiation therapy: Secondary | ICD-10-CM | POA: Diagnosis not present

## 2020-10-26 DIAGNOSIS — C541 Malignant neoplasm of endometrium: Secondary | ICD-10-CM | POA: Diagnosis not present

## 2020-10-27 ENCOUNTER — Emergency Department: Payer: Medicare HMO

## 2020-10-27 ENCOUNTER — Ambulatory Visit: Payer: Self-pay | Admitting: *Deleted

## 2020-10-27 ENCOUNTER — Emergency Department
Admission: EM | Admit: 2020-10-27 | Discharge: 2020-10-27 | Disposition: A | Discharge status: Home / Self Care | Payer: Medicare HMO | Attending: Emergency Medicine | Admitting: Emergency Medicine

## 2020-10-27 ENCOUNTER — Ambulatory Visit
Admission: RE | Admit: 2020-10-27 | Discharge: 2020-10-27 | Disposition: A | Discharge status: Home / Self Care | Payer: Medicare HMO | Source: Ambulatory Visit | Attending: Radiation Oncology | Admitting: Radiation Oncology

## 2020-10-27 ENCOUNTER — Ambulatory Visit: Payer: Medicare HMO

## 2020-10-27 ENCOUNTER — Other Ambulatory Visit: Payer: Self-pay

## 2020-10-27 DIAGNOSIS — Z7901 Long term (current) use of anticoagulants: Secondary | ICD-10-CM | POA: Diagnosis not present

## 2020-10-27 DIAGNOSIS — Z7984 Long term (current) use of oral hypoglycemic drugs: Secondary | ICD-10-CM | POA: Diagnosis not present

## 2020-10-27 DIAGNOSIS — E785 Hyperlipidemia, unspecified: Secondary | ICD-10-CM | POA: Diagnosis not present

## 2020-10-27 DIAGNOSIS — E1169 Type 2 diabetes mellitus with other specified complication: Secondary | ICD-10-CM | POA: Insufficient documentation

## 2020-10-27 DIAGNOSIS — Z87891 Personal history of nicotine dependence: Secondary | ICD-10-CM | POA: Diagnosis not present

## 2020-10-27 DIAGNOSIS — Z8542 Personal history of malignant neoplasm of other parts of uterus: Secondary | ICD-10-CM | POA: Insufficient documentation

## 2020-10-27 DIAGNOSIS — Z79899 Other long term (current) drug therapy: Secondary | ICD-10-CM | POA: Diagnosis not present

## 2020-10-27 DIAGNOSIS — Z7951 Long term (current) use of inhaled steroids: Secondary | ICD-10-CM | POA: Insufficient documentation

## 2020-10-27 DIAGNOSIS — R0602 Shortness of breath: Secondary | ICD-10-CM | POA: Diagnosis not present

## 2020-10-27 DIAGNOSIS — C541 Malignant neoplasm of endometrium: Secondary | ICD-10-CM | POA: Diagnosis not present

## 2020-10-27 DIAGNOSIS — R000 Tachycardia, unspecified: Secondary | ICD-10-CM | POA: Diagnosis not present

## 2020-10-27 DIAGNOSIS — I10 Essential (primary) hypertension: Secondary | ICD-10-CM | POA: Insufficient documentation

## 2020-10-27 LAB — CBC
HCT: 34.9 % — ABNORMAL LOW (ref 36.0–46.0)
Hemoglobin: 10.7 g/dL — ABNORMAL LOW (ref 12.0–15.0)
MCH: 27.2 pg (ref 26.0–34.0)
MCHC: 30.7 g/dL (ref 30.0–36.0)
MCV: 88.8 fL (ref 80.0–100.0)
Platelets: 301 10*3/uL (ref 150–400)
RBC: 3.93 MIL/uL (ref 3.87–5.11)
RDW: 25.2 % — ABNORMAL HIGH (ref 11.5–15.5)
WBC: 4.5 10*3/uL (ref 4.0–10.5)
nRBC: 0 % (ref 0.0–0.2)

## 2020-10-27 LAB — BASIC METABOLIC PANEL
Anion gap: 6 (ref 5–15)
BUN: 15 mg/dL (ref 8–23)
CO2: 24 mmol/L (ref 22–32)
Calcium: 9 mg/dL (ref 8.9–10.3)
Chloride: 108 mmol/L (ref 98–111)
Creatinine, Ser: 0.78 mg/dL (ref 0.44–1.00)
GFR, Estimated: >60 mL/min (ref >60)
Glucose, Bld: 130 mg/dL — ABNORMAL HIGH (ref 70–99)
Potassium: 4.3 mmol/L (ref 3.5–5.1)
Sodium: 138 mmol/L (ref 135–145)

## 2020-10-27 LAB — TROPONIN I (HIGH SENSITIVITY): Troponin I (High Sensitivity): 6 ng/L (ref <18)

## 2020-10-27 MED ORDER — ALBUTEROL SULFATE HFA 108 (90 BASE) MCG/ACT IN AERS
2.0000 | INHALATION_SPRAY | RESPIRATORY_TRACT | Status: DC | PRN
  Filled 2020-10-27: qty 6.7

## 2020-10-27 NOTE — ED Provider Notes (Signed)
Courtney Mack   ____________________________________________    I have reviewed the triage vital signs and the nursing notes.   HISTORY  Chief Complaint Shortness of Breath     HPI Courtney Mack is a 65 y.o. female with history of diabetes, DVT, on blood thinners who reports that around noon today she "felt strange "and decided to check her pulse oximetry and heart rate and found them both to be low, they quickly normalized.  She denies any chest pain, no shortness of breath at this time.  No leg pain or swelling, no palpitations.  She did have radiation to her ovary today at 77.  No fevers chills nausea or vomiting.  Currently feels well  Past Medical History:  Diagnosis Date  . Diabetes mellitus without complication ([place])   . DVT (deep venous thrombosis) ([place]) 09/06/2020  . High cholesterol   . Hx of blood clots   . Hypertension   . IDA (iron deficiency anemia) 09/21/2020    Patient Active Problem List   Diagnosis Date Noted  . IDA (iron deficiency anemia) 09/21/2020  . Endometrial cancer ([place]) 09/13/2020  . Acute blood loss anemia 09/06/2020  . Vaginal bleeding 09/06/2020  . DVT (deep venous thrombosis) ([place]) 09/06/2020  . Anemia, unspecified 08/24/2020  . Morbidly obese ([place]) 08/23/2020  . Essential hypertension 08/23/2020  . New onset type 2 diabetes mellitus ([place]) 08/23/2020  . Dyslipidemia 08/23/2020  . Acute respiratory failure with hypoxia ([place])   . Pulmonary embolism ([place]) 08/18/2020  . Vaginal bleeding, abnormal 08/18/2020  . Obesity, morbid, BMI 50 or higher ([place]) 08/18/2020    Past Surgical History:  Procedure Laterality Date  . IVC FILTER INSERTION N/A 08/18/2020   Procedure: IVC FILTER INSERTION;  Surgeon: Katha Cabal, MD;  Location: [facility];  Service: Cardiovascular;  Laterality: N/A;  . PULMONARY THROMBECTOMY N/A 08/18/2020   Procedure: PULMONARY THROMBECTOMY;   Surgeon: Katha Cabal, MD;  Location: [facility];  Service: Cardiovascular;  Laterality: N/A;  . TUBAL LIGATION      Prior to Admission medications   Medication Sig Start Date End Date Taking? Authorizing Provider  dexamethasone (DECADRON) 4 MG tablet Take 2 tablets (8 mg total) by mouth daily. Take daily x 2 days starting the day after cisplatin chemotherapy. Take with food. Patient not taking: No sig reported 09/21/20   Earlie Server, MD  Ensure Max Protein (ENSURE MAX PROTEIN) LIQD Take 330 mLs (11 oz total) by mouth 2 (two) times daily. 09/11/20   [name], MD  Fluticasone-Salmeterol (ADVAIR) 100-50 MCG/DOSE AEPB Inhale 1 puff into the lungs 2 (two) times daily. Rinse mouth after each use    [provider]  ketoconazole (NIZORAL) 2 % cream Apply 1 application topically daily. 10/11/20   Noreene Filbert, MD  losartan (COZAAR) 25 MG tablet Take 1 tablet (25 mg total) by mouth daily. 10/03/20   Earlie Server, MD  metFORMIN (GLUCOPHAGE) 500 MG tablet Take 1 tablet (500 mg total) by mouth 2 (two) times daily with a meal. 10/03/20   Earlie Server, MD  Multiple Vitamin (MULTIVITAMIN WITH MINERALS) TABS tablet Take 1 tablet by mouth daily. 09/12/20   [name], MD  norethindrone (AYGESTIN) 5 MG tablet Take 1 tablet (5 mg total) by mouth 3 (three) times daily. 09/11/20   [name], MD  nystatin (MYCOSTATIN/NYSTOP) powder Apply topically 2 (two) times daily. 08/24/20   [name], MD  ondansetron (ZOFRAN) 4 MG tablet Take 1 tablet (4 mg total) by mouth every 6 (six) hours as needed for nausea. 08/24/20   Drema DallasWoods, Curtis J, MD  prochlorperazine (COMPAZINE) 10 MG tablet Take 1 tablet (10 mg total) by mouth every 6 (six) hours as needed (Nausea or vomiting). 09/21/20   Rickard PatienceYu, Zhou, MD  rivaroxaban (XARELTO) 20 MG TABS tablet Take 1 tablet (20 mg total) by mouth daily with supper. 10/03/20   Rickard PatienceYu, Zhou, MD  rosuvastatin (CRESTOR) 10 MG tablet Take 1 tablet (10 mg total) by mouth daily.  10/03/20   Rickard PatienceYu, Zhou, MD     Allergies Patient has no known allergies.  Family History  Problem Relation Age of Onset  . Hypertension Mother   . Diabetes Mother   . Diabetes Father   . Hypertension Father   . High Cholesterol Father   . Congestive Heart Failure Father     Social History Social History   Tobacco Use  . Smoking status: Former Smoker    Packs/day: 0.25    Years: 0.50    Pack years: 0.12    Types: Cigarettes    Quit date: 09/21/1980    Years since quitting: 40.1  . Smokeless tobacco: Never Used  . Tobacco comment: smoked 2-4 cigarettes for about 2-3 weeks   Vaping Use  . Vaping Use: Never used  Substance Use Topics  . Alcohol use: Not Currently  . Drug use: Not Currently    Review of Systems  Constitutional: No fever/chills Eyes: No visual changes.  ENT: No sore throat. Cardiovascular: Denies chest pain. Respiratory: Denies shortness of breath. Gastrointestinal: No abdominal pain.    Genitourinary: Negative for dysuria. Musculoskeletal: Negative for back pain. Skin: Negative for rash. Neurological: Negative for headaches    ____________________________________________   PHYSICAL EXAM:  VITAL SIGNS: ED Triage Vitals  Enc Vitals Group     BP 10/27/20 1409 (!) 145/65     Pulse Rate 10/27/20 1409 84     Resp 10/27/20 1409 20     Temp 10/27/20 1409 98.6 F (37 C)     Temp Source 10/27/20 1409 Oral     SpO2 10/27/20 1409 96 %     Weight 10/27/20 1410 106.1 kg (234 lb)     Height 10/27/20 1410 1.473 m (4\' 10" )     Head Circumference --      Peak Flow --      Pain Score 10/27/20 1410 0     Pain Loc --      Pain Edu? --      Excl. in GC? --     Constitutional: Alert and oriented.  Nose: No congestion/rhinnorhea. Mouth/Throat: Mucous membranes are moist.    Cardiovascular: Normal rate, regular rhythm. Grossly normal heart sounds.  Good peripheral circulation. Respiratory: Normal respiratory effort.  No retractions. Lungs  CTAB. Gastrointestinal: Soft and nontender. No distention.  No CVA tenderness.  Musculoskeletal: No lower extremity tenderness nor edema.  Warm and well perfused Neurologic:  Normal speech and language. No gross focal neurologic deficits are appreciated.  Skin:  Skin is warm, dry and intact. No rash noted. Psychiatric: Mood and affect are normal. Speech and behavior are normal.  ____________________________________________   LABS (all labs ordered are listed, but only abnormal results are displayed)  Labs Reviewed  BASIC METABOLIC PANEL - Abnormal; Notable for the following components:      Result Value   Glucose, Bld 130 (*)    All other components within normal limits  CBC - Abnormal; Notable for the following components:   Hemoglobin 10.7 (*)    HCT 34.9 (*)    RDW 25.2 (*)    All other components within normal limits  TROPONIN I (HIGH SENSITIVITY)   ____________________________________________  EKG  ED ECG REPORT I, Lavonia Drafts, the attending physician, personally viewed and interpreted this ECG.  Date: 10/27/2020  Rhythm: normal sinus rhythm QRS Axis: normal Intervals: normal ST/T Wave abnormalities: normal Narrative Interpretation: no evidence of acute ischemia  ____________________________________________  RADIOLOGY  Chest x-ray unremarkable, reviewed by me ____________________________________________   PROCEDURES  Procedure(s) performed: No  Procedures   Critical Care performed: No ____________________________________________   INITIAL IMPRESSION / ASSESSMENT AND PLAN / ED COURSE  Pertinent labs & imaging results that were available during my care of the patient were reviewed by me and considered in my medical decision making (see chart for details).  Patient presents with concerns for low pulse oximeter reading, however at the time she did not feel short of breath.  She feels quite well now and has no complaints.  No shortness of breath.  No  chest pain.  No fevers chills cough nausea or vomiting.  Vital signs reassuring, exam is benign.  Basic labs pending including high sensitive troponin and chest x-ray   Lab work is reassuring, patient remains comfortable and asymptomatic, appropriate discharge at this time    ____________________________________________   FINAL CLINICAL IMPRESSION(S) / ED DIAGNOSES  Final diagnoses:  SOB (shortness of breath)        Mack:  This document was prepared using Dragon voice recognition software and may include unintentional dictation errors.   Lavonia Drafts, MD 10/27/20 [phone]

## 2020-10-27 NOTE — ED Notes (Signed)
Patient transported to X-ray 

## 2020-10-27 NOTE — ED Triage Notes (Signed)
Pt states she experienced shortness of breath around noon today, checked oxygen with pulse ox at home and states that oxygen was in 80s and HR was 104, used at home inhaler with improvement. Denies shortness of breath currently. Pt states she had her scheduled radiation treatment at 11am today and is unsure if this is related. O2 saturation 96% at this time on room air, respirations even and unlabored, no distress noted. Heart rate currently 91.

## 2020-10-27 NOTE — Telephone Encounter (Signed)
C/o difficulty breathing and O2 sats in the 80's. Pulse rate 97bpm. Difficulty breathing comes and goes. Started having difficulty breathing after radiation treatment this am. Nose tingling. Wheezing at times. Cough and runny nose. Hx blood clot in lungs. Instructed patient to get assessed and go to UC or ED for assessment. Care advise given. Patient verbalized understanding of care advise  And to go to [facility] or ED now.   Reason for Disposition . Cancer treatment in past six months (or has cancer now)  Answer Assessment - Initial Assessment Questions 1. RESPIRATORY STATUS: "Describe your breathing?" (e.g., wheezing, shortness of breath, unable to speak, severe coughing)      Difficulty breathing and coughing. With wheezing occasionally . 2. ONSET: "When did this breathing problem begin?"      This am after leaving radiology  3. PATTERN "Does the difficult breathing come and go, or has it been constant since it started?"      Comes and goes 4. SEVERITY: "How bad is your breathing?" (e.g., mild, moderate, severe)    - MILD: No SOB at rest, mild SOB with walking, speaks normally in sentences, can lay down, no retractions, pulse < 100.    - MODERATE: SOB at rest, SOB with minimal exertion and prefers to sit, cannot lie down flat, speaks in phrases, mild retractions, audible wheezing, pulse 100-120.    - SEVERE: Very SOB at rest, speaks in single words, struggling to breathe, sitting hunched forward, retractions, pulse > 120      moderate 5. RECURRENT SYMPTOM: "Have you had difficulty breathing before?" If Yes, ask: "When was the last time?" and "What happened that time?"      Yes hx blood clot in lungs Feb. 11 6. CARDIAC HISTORY: "Do you have any history of heart disease?" (e.g., heart attack, angina, bypass surgery, angioplasty)      Na  7. LUNG HISTORY: "Do you have any history of lung disease?"  (e.g., pulmonary embolus, asthma, emphysema)     PE 8. CAUSE: "What do you think is causing the  breathing problem?"      Not sure  9. OTHER SYMPTOMS: "Do you have any other symptoms? (e.g., dizziness, runny nose, cough, chest pain, fever)     Runny nose cough hx CA 10. PREGNANCY: "Is there any chance you are pregnant?" "When was your last menstrual period?"       na 11. TRAVEL: "Have you traveled out of the country in the last month?" (e.g., travel history, exposures)       na  Protocols used: BREATHING DIFFICULTY-A-AH

## 2020-10-30 ENCOUNTER — Ambulatory Visit: Payer: Medicare HMO

## 2020-10-30 ENCOUNTER — Ambulatory Visit
Admission: RE | Admit: 2020-10-30 | Discharge: 2020-10-30 | Disposition: A | Discharge status: Home / Self Care | Payer: Medicare HMO | Source: Ambulatory Visit | Attending: Radiation Oncology | Admitting: Radiation Oncology

## 2020-10-30 DIAGNOSIS — C541 Malignant neoplasm of endometrium: Secondary | ICD-10-CM | POA: Diagnosis not present

## 2020-10-30 DIAGNOSIS — Z510 Encounter for antineoplastic radiation therapy: Secondary | ICD-10-CM | POA: Diagnosis not present

## 2020-10-31 ENCOUNTER — Ambulatory Visit: Payer: Medicare HMO

## 2020-10-31 ENCOUNTER — Other Ambulatory Visit: Payer: Self-pay | Admitting: *Deleted

## 2020-10-31 ENCOUNTER — Ambulatory Visit
Admission: RE | Admit: 2020-10-31 | Discharge: 2020-10-31 | Disposition: A | Discharge status: Home / Self Care | Payer: Medicare HMO | Source: Ambulatory Visit | Attending: Radiation Oncology | Admitting: Radiation Oncology

## 2020-10-31 DIAGNOSIS — Z510 Encounter for antineoplastic radiation therapy: Secondary | ICD-10-CM | POA: Diagnosis not present

## 2020-10-31 DIAGNOSIS — C541 Malignant neoplasm of endometrium: Secondary | ICD-10-CM

## 2020-11-01 ENCOUNTER — Ambulatory Visit
Admission: RE | Admit: 2020-11-01 | Discharge: 2020-11-01 | Disposition: A | Discharge status: Home / Self Care | Payer: Medicare HMO | Source: Ambulatory Visit | Attending: Radiation Oncology | Admitting: Radiation Oncology

## 2020-11-01 DIAGNOSIS — C541 Malignant neoplasm of endometrium: Secondary | ICD-10-CM | POA: Diagnosis not present

## 2020-11-01 DIAGNOSIS — Z510 Encounter for antineoplastic radiation therapy: Secondary | ICD-10-CM | POA: Diagnosis not present

## 2020-11-05 DIAGNOSIS — Z48812 Encounter for surgical aftercare following surgery on the circulatory system: Secondary | ICD-10-CM | POA: Diagnosis not present

## 2020-11-05 DIAGNOSIS — Z95828 Presence of other vascular implants and grafts: Secondary | ICD-10-CM | POA: Diagnosis not present

## 2020-11-05 DIAGNOSIS — D62 Acute posthemorrhagic anemia: Secondary | ICD-10-CM | POA: Diagnosis not present

## 2020-11-08 DIAGNOSIS — C569 Malignant neoplasm of unspecified ovary: Secondary | ICD-10-CM | POA: Diagnosis not present

## 2020-11-08 DIAGNOSIS — I10 Essential (primary) hypertension: Secondary | ICD-10-CM | POA: Diagnosis not present

## 2020-11-08 DIAGNOSIS — C55 Malignant neoplasm of uterus, part unspecified: Secondary | ICD-10-CM | POA: Insufficient documentation

## 2020-11-08 DIAGNOSIS — E119 Type 2 diabetes mellitus without complications: Secondary | ICD-10-CM | POA: Diagnosis not present

## 2020-11-08 DIAGNOSIS — C549 Malignant neoplasm of corpus uteri, unspecified: Secondary | ICD-10-CM | POA: Diagnosis not present

## 2020-11-08 DIAGNOSIS — I8291 Chronic embolism and thrombosis of unspecified vein: Secondary | ICD-10-CM | POA: Diagnosis not present

## 2020-11-08 DIAGNOSIS — Z1389 Encounter for screening for other disorder: Secondary | ICD-10-CM | POA: Diagnosis not present

## 2020-11-08 DIAGNOSIS — C541 Malignant neoplasm of endometrium: Secondary | ICD-10-CM | POA: Diagnosis not present

## 2020-11-09 ENCOUNTER — Telehealth: Payer: Self-pay | Admitting: Oncology

## 2020-11-09 NOTE — Telephone Encounter (Signed)
Pt canceled her 5/13 lab and 5/17 md and venofer appointment as she will be receiving treatment at [facility].  She will call back to reschedule once treatment is done.  Patient states appointments in June with Dr. Baruch Gouty are fine for now.

## 2020-11-10 DIAGNOSIS — I10 Essential (primary) hypertension: Secondary | ICD-10-CM | POA: Diagnosis not present

## 2020-11-10 DIAGNOSIS — Z9189 Other specified personal risk factors, not elsewhere classified: Secondary | ICD-10-CM | POA: Diagnosis not present

## 2020-11-10 DIAGNOSIS — C541 Malignant neoplasm of endometrium: Secondary | ICD-10-CM | POA: Diagnosis not present

## 2020-11-10 DIAGNOSIS — I824Y9 Acute embolism and thrombosis of unspecified deep veins of unspecified proximal lower extremity: Secondary | ICD-10-CM | POA: Diagnosis not present

## 2020-11-10 DIAGNOSIS — R0600 Dyspnea, unspecified: Secondary | ICD-10-CM | POA: Insufficient documentation

## 2020-11-10 DIAGNOSIS — Z6841 Body Mass Index (BMI) 40.0 and over, adult: Secondary | ICD-10-CM | POA: Diagnosis not present

## 2020-11-10 DIAGNOSIS — R0609 Other forms of dyspnea: Secondary | ICD-10-CM | POA: Insufficient documentation

## 2020-11-10 DIAGNOSIS — N950 Postmenopausal bleeding: Secondary | ICD-10-CM | POA: Diagnosis not present

## 2020-11-10 DIAGNOSIS — E118 Type 2 diabetes mellitus with unspecified complications: Secondary | ICD-10-CM | POA: Diagnosis not present

## 2020-11-10 DIAGNOSIS — I2699 Other pulmonary embolism without acute cor pulmonale: Secondary | ICD-10-CM | POA: Diagnosis not present

## 2020-11-10 DIAGNOSIS — D62 Acute posthemorrhagic anemia: Secondary | ICD-10-CM | POA: Diagnosis not present

## 2020-11-13 DIAGNOSIS — D62 Acute posthemorrhagic anemia: Secondary | ICD-10-CM | POA: Diagnosis not present

## 2020-11-13 DIAGNOSIS — E118 Type 2 diabetes mellitus with unspecified complications: Secondary | ICD-10-CM | POA: Diagnosis not present

## 2020-11-13 DIAGNOSIS — I2699 Other pulmonary embolism without acute cor pulmonale: Secondary | ICD-10-CM | POA: Diagnosis not present

## 2020-11-13 DIAGNOSIS — N950 Postmenopausal bleeding: Secondary | ICD-10-CM | POA: Diagnosis not present

## 2020-11-13 DIAGNOSIS — Z9189 Other specified personal risk factors, not elsewhere classified: Secondary | ICD-10-CM | POA: Diagnosis not present

## 2020-11-13 DIAGNOSIS — I824Y9 Acute embolism and thrombosis of unspecified deep veins of unspecified proximal lower extremity: Secondary | ICD-10-CM | POA: Diagnosis not present

## 2020-11-13 DIAGNOSIS — C541 Malignant neoplasm of endometrium: Secondary | ICD-10-CM | POA: Diagnosis not present

## 2020-11-13 DIAGNOSIS — R0600 Dyspnea, unspecified: Secondary | ICD-10-CM | POA: Diagnosis not present

## 2020-11-17 ENCOUNTER — Other Ambulatory Visit: Payer: Medicare HMO

## 2020-11-17 DIAGNOSIS — C541 Malignant neoplasm of endometrium: Secondary | ICD-10-CM | POA: Diagnosis not present

## 2020-11-17 DIAGNOSIS — C549 Malignant neoplasm of corpus uteri, unspecified: Secondary | ICD-10-CM | POA: Diagnosis not present

## 2020-11-21 ENCOUNTER — Ambulatory Visit: Payer: Medicare HMO | Admitting: Oncology

## 2020-11-21 ENCOUNTER — Ambulatory Visit: Payer: Medicare HMO

## 2020-11-24 DIAGNOSIS — C549 Malignant neoplasm of corpus uteri, unspecified: Secondary | ICD-10-CM | POA: Diagnosis not present

## 2020-11-24 DIAGNOSIS — Z510 Encounter for antineoplastic radiation therapy: Secondary | ICD-10-CM | POA: Diagnosis not present

## 2020-11-24 DIAGNOSIS — E119 Type 2 diabetes mellitus without complications: Secondary | ICD-10-CM | POA: Diagnosis not present

## 2020-11-24 DIAGNOSIS — I10 Essential (primary) hypertension: Secondary | ICD-10-CM | POA: Diagnosis not present

## 2020-11-24 DIAGNOSIS — Z7984 Long term (current) use of oral hypoglycemic drugs: Secondary | ICD-10-CM | POA: Diagnosis not present

## 2020-11-24 DIAGNOSIS — C541 Malignant neoplasm of endometrium: Secondary | ICD-10-CM | POA: Diagnosis not present

## 2020-11-28 DIAGNOSIS — C549 Malignant neoplasm of corpus uteri, unspecified: Secondary | ICD-10-CM | POA: Diagnosis not present

## 2020-11-28 DIAGNOSIS — C55 Malignant neoplasm of uterus, part unspecified: Secondary | ICD-10-CM | POA: Diagnosis not present

## 2020-12-01 DIAGNOSIS — C549 Malignant neoplasm of corpus uteri, unspecified: Secondary | ICD-10-CM | POA: Diagnosis not present

## 2020-12-01 DIAGNOSIS — C541 Malignant neoplasm of endometrium: Secondary | ICD-10-CM | POA: Diagnosis not present

## 2020-12-01 DIAGNOSIS — E119 Type 2 diabetes mellitus without complications: Secondary | ICD-10-CM | POA: Diagnosis not present

## 2020-12-01 DIAGNOSIS — I10 Essential (primary) hypertension: Secondary | ICD-10-CM | POA: Diagnosis not present

## 2020-12-13 ENCOUNTER — Ambulatory Visit: Payer: Medicare HMO | Admitting: Radiation Oncology

## 2020-12-13 ENCOUNTER — Inpatient Hospital Stay: Payer: Medicare HMO

## 2020-12-20 DIAGNOSIS — R7309 Other abnormal glucose: Secondary | ICD-10-CM | POA: Diagnosis not present

## 2020-12-20 DIAGNOSIS — C569 Malignant neoplasm of unspecified ovary: Secondary | ICD-10-CM | POA: Diagnosis not present

## 2020-12-20 DIAGNOSIS — E119 Type 2 diabetes mellitus without complications: Secondary | ICD-10-CM | POA: Diagnosis not present

## 2020-12-20 DIAGNOSIS — I10 Essential (primary) hypertension: Secondary | ICD-10-CM | POA: Diagnosis not present

## 2020-12-22 ENCOUNTER — Other Ambulatory Visit: Payer: Self-pay | Admitting: Family Medicine

## 2020-12-22 DIAGNOSIS — Z1231 Encounter for screening mammogram for malignant neoplasm of breast: Secondary | ICD-10-CM

## 2020-12-27 ENCOUNTER — Ambulatory Visit: Payer: Medicare HMO | Admitting: Radiation Oncology

## 2020-12-27 ENCOUNTER — Inpatient Hospital Stay: Payer: Medicare HMO

## 2020-12-27 ENCOUNTER — Telehealth: Payer: Self-pay | Admitting: Nurse Practitioner

## 2020-12-27 NOTE — Telephone Encounter (Signed)
Spoke to patient. She complains of her upper legs being too swollen to come to clinic. I encouraged her to contact her PCP to discuss. Appointment for gyn onc rescheduled.

## 2020-12-27 NOTE — Telephone Encounter (Signed)
-----   Message from Secundino Ginger sent at 12/27/2020  9:03 AM EDT ----- Regarding: appointment patient left a msg with answering service that shw wanted to cancel her appointment due to being too swollen. I see she was a no show. Just fyi. She wants to reschedule.

## 2021-01-02 ENCOUNTER — Emergency Department: Payer: Medicare HMO

## 2021-01-02 ENCOUNTER — Encounter: Payer: Self-pay | Admitting: *Deleted

## 2021-01-02 ENCOUNTER — Other Ambulatory Visit: Payer: Self-pay

## 2021-01-02 ENCOUNTER — Inpatient Hospital Stay
Admission: EM | Admit: 2021-01-02 | Discharge: 2021-01-05 | DRG: 271 | Disposition: A | Discharge status: Home Health | Payer: Medicare HMO | Attending: Internal Medicine | Admitting: Internal Medicine

## 2021-01-02 DIAGNOSIS — D509 Iron deficiency anemia, unspecified: Secondary | ICD-10-CM | POA: Diagnosis not present

## 2021-01-02 DIAGNOSIS — Z8542 Personal history of malignant neoplasm of other parts of uterus: Secondary | ICD-10-CM | POA: Diagnosis not present

## 2021-01-02 DIAGNOSIS — Z825 Family history of asthma and other chronic lower respiratory diseases: Secondary | ICD-10-CM | POA: Diagnosis not present

## 2021-01-02 DIAGNOSIS — I2699 Other pulmonary embolism without acute cor pulmonale: Secondary | ICD-10-CM | POA: Diagnosis not present

## 2021-01-02 DIAGNOSIS — L304 Erythema intertrigo: Secondary | ICD-10-CM | POA: Diagnosis present

## 2021-01-02 DIAGNOSIS — Z79899 Other long term (current) drug therapy: Secondary | ICD-10-CM

## 2021-01-02 DIAGNOSIS — Z821 Family history of blindness and visual loss: Secondary | ICD-10-CM | POA: Diagnosis not present

## 2021-01-02 DIAGNOSIS — R0602 Shortness of breath: Secondary | ICD-10-CM | POA: Diagnosis not present

## 2021-01-02 DIAGNOSIS — Z20822 Contact with and (suspected) exposure to covid-19: Secondary | ICD-10-CM | POA: Diagnosis present

## 2021-01-02 DIAGNOSIS — E7800 Pure hypercholesterolemia, unspecified: Secondary | ICD-10-CM | POA: Diagnosis present

## 2021-01-02 DIAGNOSIS — Z923 Personal history of irradiation: Secondary | ICD-10-CM | POA: Diagnosis not present

## 2021-01-02 DIAGNOSIS — Z7984 Long term (current) use of oral hypoglycemic drugs: Secondary | ICD-10-CM

## 2021-01-02 DIAGNOSIS — Z7901 Long term (current) use of anticoagulants: Secondary | ICD-10-CM | POA: Diagnosis not present

## 2021-01-02 DIAGNOSIS — Z833 Family history of diabetes mellitus: Secondary | ICD-10-CM

## 2021-01-02 DIAGNOSIS — J9811 Atelectasis: Secondary | ICD-10-CM | POA: Diagnosis present

## 2021-01-02 DIAGNOSIS — Z818 Family history of other mental and behavioral disorders: Secondary | ICD-10-CM | POA: Diagnosis not present

## 2021-01-02 DIAGNOSIS — I82413 Acute embolism and thrombosis of femoral vein, bilateral: Secondary | ICD-10-CM | POA: Diagnosis not present

## 2021-01-02 DIAGNOSIS — I82411 Acute embolism and thrombosis of right femoral vein: Secondary | ICD-10-CM | POA: Diagnosis not present

## 2021-01-02 DIAGNOSIS — Z6841 Body Mass Index (BMI) 40.0 and over, adult: Secondary | ICD-10-CM | POA: Diagnosis not present

## 2021-01-02 DIAGNOSIS — Z83438 Family history of other disorder of lipoprotein metabolism and other lipidemia: Secondary | ICD-10-CM | POA: Diagnosis not present

## 2021-01-02 DIAGNOSIS — R609 Edema, unspecified: Secondary | ICD-10-CM | POA: Diagnosis not present

## 2021-01-02 DIAGNOSIS — C541 Malignant neoplasm of endometrium: Secondary | ICD-10-CM | POA: Diagnosis present

## 2021-01-02 DIAGNOSIS — Z7951 Long term (current) use of inhaled steroids: Secondary | ICD-10-CM

## 2021-01-02 DIAGNOSIS — I82423 Acute embolism and thrombosis of iliac vein, bilateral: Secondary | ICD-10-CM | POA: Diagnosis present

## 2021-01-02 DIAGNOSIS — I2782 Chronic pulmonary embolism: Secondary | ICD-10-CM | POA: Diagnosis present

## 2021-01-02 DIAGNOSIS — I10 Essential (primary) hypertension: Secondary | ICD-10-CM | POA: Diagnosis not present

## 2021-01-02 DIAGNOSIS — I82412 Acute embolism and thrombosis of left femoral vein: Secondary | ICD-10-CM | POA: Diagnosis not present

## 2021-01-02 DIAGNOSIS — Z95828 Presence of other vascular implants and grafts: Secondary | ICD-10-CM

## 2021-01-02 DIAGNOSIS — D62 Acute posthemorrhagic anemia: Secondary | ICD-10-CM | POA: Diagnosis not present

## 2021-01-02 DIAGNOSIS — R0902 Hypoxemia: Secondary | ICD-10-CM | POA: Diagnosis not present

## 2021-01-02 DIAGNOSIS — I890 Lymphedema, not elsewhere classified: Secondary | ICD-10-CM | POA: Diagnosis not present

## 2021-01-02 DIAGNOSIS — Z87891 Personal history of nicotine dependence: Secondary | ICD-10-CM

## 2021-01-02 DIAGNOSIS — Z8249 Family history of ischemic heart disease and other diseases of the circulatory system: Secondary | ICD-10-CM | POA: Diagnosis not present

## 2021-01-02 DIAGNOSIS — E119 Type 2 diabetes mellitus without complications: Secondary | ICD-10-CM | POA: Diagnosis not present

## 2021-01-02 DIAGNOSIS — I2694 Multiple subsegmental pulmonary emboli without acute cor pulmonale: Secondary | ICD-10-CM | POA: Diagnosis not present

## 2021-01-02 DIAGNOSIS — Z9851 Tubal ligation status: Secondary | ICD-10-CM

## 2021-01-02 DIAGNOSIS — Z48812 Encounter for surgical aftercare following surgery on the circulatory system: Secondary | ICD-10-CM | POA: Diagnosis not present

## 2021-01-02 DIAGNOSIS — R52 Pain, unspecified: Secondary | ICD-10-CM | POA: Diagnosis not present

## 2021-01-02 HISTORY — DX: Acute embolism and thrombosis of femoral vein, bilateral: I82.413

## 2021-01-02 LAB — RESP PANEL BY RT-PCR (FLU A&B, COVID) ARPGX2
Influenza A by PCR: NEGATIVE
Influenza B by PCR: NEGATIVE
SARS Coronavirus 2 by RT PCR: NEGATIVE

## 2021-01-02 LAB — APTT: aPTT: 26 s (ref 24–36)

## 2021-01-02 LAB — BASIC METABOLIC PANEL
Anion gap: 10 (ref 5–15)
BUN: 13 mg/dL (ref 8–23)
CO2: 23 mmol/L (ref 22–32)
Calcium: 8.6 mg/dL — ABNORMAL LOW (ref 8.9–10.3)
Chloride: 103 mmol/L (ref 98–111)
Creatinine, Ser: 0.98 mg/dL (ref 0.44–1.00)
GFR, Estimated: >60 mL/min (ref >60)
Glucose, Bld: 140 mg/dL — ABNORMAL HIGH (ref 70–99)
Potassium: 3.9 mmol/L (ref 3.5–5.1)
Sodium: 136 mmol/L (ref 135–145)

## 2021-01-02 LAB — CBC
HCT: 32.6 % — ABNORMAL LOW (ref 36.0–46.0)
Hemoglobin: 10.6 g/dL — ABNORMAL LOW (ref 12.0–15.0)
MCH: 29.4 pg (ref 26.0–34.0)
MCHC: 32.5 g/dL (ref 30.0–36.0)
MCV: 90.3 fL (ref 80.0–100.0)
Platelets: 306 K/uL (ref 150–400)
RBC: 3.61 MIL/uL — ABNORMAL LOW (ref 3.87–5.11)
RDW: 17.5 % — ABNORMAL HIGH (ref 11.5–15.5)
WBC: 6.7 K/uL (ref 4.0–10.5)
nRBC: 0 % (ref 0.0–0.2)

## 2021-01-02 LAB — LACTIC ACID, PLASMA: Lactic Acid, Venous: 1.4 mmol/L (ref 0.5–1.9)

## 2021-01-02 LAB — PROTIME-INR
INR: 1.2 (ref 0.8–1.2)
Prothrombin Time: 14.9 s (ref 11.4–15.2)

## 2021-01-02 LAB — PROCALCITONIN: Procalcitonin: 0.23 ng/mL

## 2021-01-02 LAB — HEPARIN LEVEL (UNFRACTIONATED)
Heparin Unfractionated: <0.1 [IU]/mL — ABNORMAL LOW (ref 0.30–0.70)
Heparin Unfractionated: <0.1 IU/mL — ABNORMAL LOW (ref 0.30–0.70)

## 2021-01-02 LAB — BRAIN NATRIURETIC PEPTIDE: B Natriuretic Peptide: 87.9 pg/mL (ref 0.0–100.0)

## 2021-01-02 LAB — GLUCOSE, CAPILLARY: Glucose-Capillary: 82 mg/dL (ref 70–99)

## 2021-01-02 LAB — TROPONIN I (HIGH SENSITIVITY): Troponin I (High Sensitivity): 4 ng/L

## 2021-01-02 MED ORDER — SODIUM CHLORIDE 0.9% FLUSH
3.0000 mL | Freq: Two times a day (BID) | INTRAVENOUS | Status: DC
  Administered 2021-01-02 – 2021-01-05 (×6): 3 mL via INTRAVENOUS

## 2021-01-02 MED ORDER — SODIUM CHLORIDE 0.9 % IV SOLN: 250.0000 mL | INTRAVENOUS | Status: DC | PRN

## 2021-01-02 MED ORDER — SODIUM CHLORIDE 0.9 % IV SOLN: INTRAVENOUS | Status: DC

## 2021-01-02 MED ORDER — ENSURE MAX PROTEIN PO LIQD: 11.0000 [oz_av] | Freq: Two times a day (BID) | ORAL | Status: DC

## 2021-01-02 MED ORDER — ROSUVASTATIN CALCIUM 10 MG PO TABS
10.0000 mg | ORAL_TABLET | Freq: Every day | ORAL | Status: DC
  Administered 2021-01-02 – 2021-01-05 (×4): 10 mg via ORAL
  Filled 2021-01-02 (×5): qty 1

## 2021-01-02 MED ORDER — HEPARIN BOLUS VIA INFUSION
2000.0000 [IU] | Freq: Once | INTRAVENOUS | Status: AC
  Administered 2021-01-02: 2000 [IU] via INTRAVENOUS
  Filled 2021-01-02: qty 2000

## 2021-01-02 MED ORDER — NORETHINDRONE ACETATE 5 MG PO TABS: 5.0000 mg | ORAL_TABLET | Freq: Three times a day (TID) | ORAL | Status: DC

## 2021-01-02 MED ORDER — MOMETASONE FURO-FORMOTEROL FUM 100-5 MCG/ACT IN AERO: 2.0000 | INHALATION_SPRAY | Freq: Two times a day (BID) | RESPIRATORY_TRACT | Status: DC

## 2021-01-02 MED ORDER — HEPARIN (PORCINE) 25000 UT/250ML-% IV SOLN
2200.0000 [IU]/h | INTRAVENOUS | Status: DC
  Administered 2021-01-02: 1050 [IU]/h via INTRAVENOUS
  Administered 2021-01-03: 17:00:00 1650 [IU]/h via INTRAVENOUS
  Administered 2021-01-04: 21:00:00 2200 [IU]/h via INTRAVENOUS
  Filled 2021-01-02 (×6): qty 250

## 2021-01-02 MED ORDER — ACETAMINOPHEN 325 MG PO TABS: 650.0000 mg | ORAL_TABLET | Freq: Four times a day (QID) | ORAL | Status: DC | PRN

## 2021-01-02 MED ORDER — SODIUM CHLORIDE 0.9% FLUSH: 3.0000 mL | INTRAVENOUS | Status: DC | PRN

## 2021-01-02 MED ORDER — INSULIN ASPART 100 UNIT/ML IJ SOLN
0.0000 [IU] | Freq: Three times a day (TID) | INTRAMUSCULAR | Status: DC
  Administered 2021-01-04: 09:00:00 3 [IU] via SUBCUTANEOUS
  Administered 2021-01-04 (×2): 4 [IU] via SUBCUTANEOUS
  Administered 2021-01-05 (×2): 3 [IU] via SUBCUTANEOUS
  Filled 2021-01-02 (×5): qty 1

## 2021-01-02 MED ORDER — MORPHINE SULFATE (PF) 4 MG/ML IV SOLN
4.0000 mg | Freq: Once | INTRAVENOUS | Status: AC
  Administered 2021-01-02: 4 mg via INTRAVENOUS
  Filled 2021-01-02: qty 1

## 2021-01-02 MED ORDER — ONDANSETRON HCL 4 MG PO TABS: 4.0000 mg | ORAL_TABLET | Freq: Four times a day (QID) | ORAL | Status: DC | PRN

## 2021-01-02 MED ORDER — MOMETASONE FURO-FORMOTEROL FUM 100-5 MCG/ACT IN AERO
2.0000 | INHALATION_SPRAY | Freq: Two times a day (BID) | RESPIRATORY_TRACT | Status: DC
  Administered 2021-01-02 – 2021-01-05 (×5): 2 via RESPIRATORY_TRACT
  Filled 2021-01-02: qty 8.8

## 2021-01-02 MED ORDER — NYSTATIN 100000 UNIT/GM EX OINT
TOPICAL_OINTMENT | Freq: Once | CUTANEOUS | Status: AC
  Filled 2021-01-02: qty 15

## 2021-01-02 MED ORDER — ACETAMINOPHEN 650 MG RE SUPP: 650.0000 mg | Freq: Four times a day (QID) | RECTAL | Status: DC | PRN

## 2021-01-02 MED ORDER — ONDANSETRON HCL 4 MG/2ML IJ SOLN: 4.0000 mg | Freq: Four times a day (QID) | INTRAMUSCULAR | Status: DC | PRN

## 2021-01-02 MED ORDER — LOSARTAN POTASSIUM 25 MG PO TABS
25.0000 mg | ORAL_TABLET | Freq: Every day | ORAL | Status: DC
  Administered 2021-01-02 – 2021-01-03 (×2): 25 mg via ORAL
  Filled 2021-01-02 (×2): qty 1

## 2021-01-02 MED ORDER — MORPHINE SULFATE (PF) 2 MG/ML IV SOLN: 2.0000 mg | INTRAVENOUS | Status: DC | PRN

## 2021-01-02 MED ORDER — IOHEXOL 350 MG/ML SOLN
100.0000 mL | Freq: Once | INTRAVENOUS | Status: AC | PRN
  Administered 2021-01-02: 100 mL via INTRAVENOUS

## 2021-01-02 MED ORDER — CEFAZOLIN SODIUM-DEXTROSE 2-4 GM/100ML-% IV SOLN
2.0000 g | INTRAVENOUS | Status: DC
  Filled 2021-01-02: qty 100

## 2021-01-02 NOTE — H&P (View-Only) (Signed)
[facility] Consult Note  MRN : [id number]  Courtney Mack is a 65 y.o. (Dec 06, 1955) female who presents with chief complaint of  Chief Complaint  Patient presents with   Leg Swelling  .  History of Present Illness: Patient presents with marked pain and swelling in both lower extremities.  She says this is going on for a few weeks now she has a previous history of DVT and IVC filter placement and has been compliant with Xarelto.  She has inoperable uterine cancer.  No open ulcerations or infection.  No fevers or chills.  Current Facility-Administered Medications  Medication Dose Route Frequency Provider Last Rate Last Admin   0.9 %  sodium chloride infusion  250 mL Intravenous PRN Agbata, Tochukwu, MD       acetaminophen (TYLENOL) tablet 650 mg  650 mg Oral Q6H PRN Agbata, Tochukwu, MD       Or   acetaminophen (TYLENOL) suppository 650 mg  650 mg Rectal Q6H PRN Agbata, Tochukwu, MD       heparin ADULT infusion 100 units/mL (25000 units/270mL)  1,050 Units/hr Intravenous Continuous Oswald Hillock, RPH 10.5 mL/hr at 01/02/21 1058 1,050 Units/hr at 01/02/21 1058   insulin aspart (novoLOG) injection 0-20 Units  0-20 Units Subcutaneous TID WC Agbata, Tochukwu, MD       losartan (COZAAR) tablet 25 mg  25 mg Oral Daily Agbata, Tochukwu, MD   25 mg at 01/02/21 1610   mometasone-formoterol (DULERA) 100-5 MCG/ACT inhaler 2 puff  2 puff Inhalation BID Agbata, Tochukwu, MD       morphine 2 MG/ML injection 2 mg  2 mg Intravenous Q4H PRN Agbata, Tochukwu, MD       ondansetron (ZOFRAN) tablet 4 mg  4 mg Oral Q6H PRN Agbata, Tochukwu, MD       Or   ondansetron (ZOFRAN) injection 4 mg  4 mg Intravenous Q6H PRN Agbata, Tochukwu, MD       rosuvastatin (CRESTOR) tablet 10 mg  10 mg Oral Daily Agbata, Tochukwu, MD   10 mg at 01/02/21 1610   sodium chloride flush (NS) 0.9 % injection 3 mL  3 mL Intravenous Q12H Agbata, Tochukwu, MD       sodium chloride flush (NS) 0.9 %  injection 3 mL  3 mL Intravenous PRN Agbata, Tochukwu, MD        Past Medical History:  Diagnosis Date   Diabetes mellitus without complication ([place])    DVT (deep venous thrombosis) ([place]) 09/06/2020   High cholesterol    Hx of blood clots    Hypertension    IDA (iron deficiency anemia) 09/21/2020    Past Surgical History:  Procedure Laterality Date   IVC FILTER INSERTION N/A 08/18/2020   Procedure: IVC FILTER INSERTION;  Surgeon: Katha Cabal, MD;  Location: [facility];  Service: Cardiovascular;  Laterality: N/A;   PULMONARY THROMBECTOMY N/A 08/18/2020   Procedure: PULMONARY THROMBECTOMY;  Surgeon: Katha Cabal, MD;  Location: [facility];  Service: Cardiovascular;  Laterality: N/A;   TUBAL LIGATION      Social History Social History   Tobacco Use   Smoking status: Former    Packs/day: 0.25    Years: 0.50    Pack years: 0.13    Types: Cigarettes    Quit date: 09/21/1980    Years since quitting: 40.3   Smokeless tobacco: Never   Tobacco comments:    smoked 2-4 cigarettes for about 2-3 weeks  Vaping Use   Vaping Use: Never used  Substance Use Topics   Alcohol use: Not Currently   Drug use: Not Currently    Family History Family History  Problem Relation Age of Onset   Hypertension Mother    Diabetes Mother    Diabetes Father    Hypertension Father    High Cholesterol Father    Congestive Heart Failure Father     No Known Allergies   REVIEW OF SYSTEMS (Negative unless checked)  Constitutional: [] Weight loss  [] Fever  [] Chills Cardiac: [] Chest pain   [] Chest pressure   [] Palpitations   [] Shortness of breath when laying flat   [] Shortness of breath at rest   [x] Shortness of breath with exertion. Vascular:  [x] Pain in legs with walking   [x] Pain in legs at rest   [] Pain in legs when laying flat   [] Claudication   [] Pain in feet when walking  [] Pain in feet at rest  [] Pain in feet when laying flat   [x] History of DVT   [x] Phlebitis    [x] Swelling in legs   [] Varicose veins   [] Non-healing ulcers Pulmonary:   [] Uses home oxygen   [] Productive cough   [] Hemoptysis   [] Wheeze  [] COPD   [] Asthma Neurologic:  [] Dizziness  [] Blackouts   [] Seizures   [] History of stroke   [] History of TIA  [] Aphasia   [] Temporary blindness   [] Dysphagia   [] Weakness or numbness in arms   [] Weakness or numbness in legs Musculoskeletal:  [] Arthritis   [] Joint swelling   [] Joint pain   [] Low back pain Hematologic:  [] Easy bruising  [] Easy bleeding   [x] Hypercoagulable state   [x] Anemic  [] Hepatitis Gastrointestinal:  [] Blood in stool   [] Vomiting blood  [] Gastroesophageal reflux/heartburn   [] Difficulty swallowing. Genitourinary:  [] Chronic kidney disease   [] Difficult urination  [] Frequent urination  [] Burning with urination   [] Blood in urine Skin:  [] Rashes   [] Ulcers   [] Wounds Psychological:  [] History of anxiety   []  History of major depression.  Physical Examination  Vitals:   01/02/21 1030 01/02/21 1100 01/02/21 1130 01/02/21 1354  BP: 135/67 127/65 (!) 120/58 107/60  Pulse: 76 73 79 77  Resp: (!) 23 (!) 23 (!) 30 18  Temp:    98.2 F (36.8 C)  TempSrc:    Oral  SpO2: 98% 97% 99% 99%  Weight:      Height:       Body mass index is 46.4 kg/m. Gen:  WD/WN, NAD Head: [place]/AT, No temporalis wasting.  Ear/Nose/Throat: Hearing grossly intact, nares w/o erythema or drainage, oropharynx w/o Erythema/Exudate Eyes: Sclera non-icteric, conjunctiva clear Neck: Trachea midline.  No JVD.  Pulmonary:  Good air movement, respirations not labored, equal bilaterally.  Cardiac: RRR, normal S1, S2. Vascular:  Vessel Right Left  Radial Palpable Palpable   Musculoskeletal: M/S 5/5 throughout.  Extremities without ischemic changes.  No deformity or atrophy. 3+ BLE edema. Neurologic: Sensation grossly intact in extremities.  Symmetrical.  Speech is fluent. Motor exam as listed above. Psychiatric: Judgment intact, Mood & affect appropriate for pt's  clinical situation. Dermatologic: No rashes or ulcers noted.  No cellulitis or open wounds.      CBC Lab Results  Component Value Date   WBC 6.7 01/02/2021   HGB 10.6 (L) 01/02/2021   HCT 32.6 (L) 01/02/2021   MCV 90.3 01/02/2021   PLT 306 01/02/2021    BMET    Component Value Date/Time   NA 136 01/02/2021 0534   K 3.9 01/02/2021  0534   CL 103 01/02/2021 0534   CO2 23 01/02/2021 0534   GLUCOSE 140 (H) 01/02/2021 0534   BUN 13 01/02/2021 0534   CREATININE 0.98 01/02/2021 0534   CALCIUM 8.6 (L) 01/02/2021 0534   GFRNONAA >60 01/02/2021 0534   Estimated Creatinine Clearance: 57.8 mL/min (by C-G formula based on SCr of 0.98 mg/dL).  COAG Lab Results  Component Value Date   INR 1.2 01/02/2021   INR 2.6 (H) 09/20/2020   INR 1.5 (H) 09/07/2020    Radiology DG Chest 2 View  Result Date: 01/02/2021 CLINICAL DATA:  Shortness of breath. EXAM: CHEST - 2 VIEW COMPARISON:  10/27/2020.  PET-CT 09/28/2020.  CT chest 08/18/2020. FINDINGS: Mediastinum and hilar structures normal. Heart size normal. Questionable nodular opacity right lung base. Follow-up PA and lateral chest x-ray suggested for further evaluation. No pleural effusion or pneumothorax. Degenerative change thoracic spine. IMPRESSION: Questionable nodular opacity right lung base. Follow-up PA lateral chest x-ray suggested for further evaluation. Electronically Signed   By: Marcello Moores  Register   On: 01/02/2021 06:05   CT Angio Chest PE W/Cm &/Or Wo Cm  Result Date: 01/02/2021 CLINICAL DATA:  65 year old female with bilateral lower extremity edema and shortness of breath. Endometrial cancer. Bilateral pulmonary emboli in February this year. EXAM: CT ANGIOGRAPHY CHEST WITH CONTRAST TECHNIQUE: Multidetector CT imaging of the chest was performed using the standard protocol during bolus administration of intravenous contrast. Multiplanar CT image reconstructions and MIPs were obtained to evaluate the vascular anatomy. CONTRAST:  172mL  OMNIPAQUE IOHEXOL 350 MG/ML SOLN COMPARISON:  PET-CT 09/28/2020 chest CTA 08/18/2020. FINDINGS: Cardiovascular: Good contrast bolus timing in the pulmonary arterial tree. Clearance of the saddle embolus demonstrated in February. Very small volume of chronic PE suspected at the arborization of the right lower lung pulmonary arteries (series 5, images 130 through 146. Similar nonocclusive thrombus adherent to the wall in the right lower lobe segmental branch on image 169, and that clot also bifurcates distally. Those arteries were completely opacified in February and now appear patent. No pulmonary artery occlusion or convincing acute pulmonary embolus identified today. Cardiac size within normal limits. No pericardial effusion. Negative visible aorta aside from mild calcified atherosclerosis. No definite calcified coronary artery plaque. Mediastinum/Nodes: No mediastinal lymphadenopathy. Lungs/Pleura: Major airways are patent with mild upper lobe mosaic attenuation in both lungs. Patchy additional distal peribronchial opacity in the anterior inferior upper lobe, most resembles atelectasis. No pleural effusion or consolidation. Upper Abdomen: Negative visible liver, gallbladder, spleen, pancreas, adrenal glands, kidneys, and bowel in the upper abdomen. Musculoskeletal: No acute or suspicious osseous lesion identified. Dependent body wall edema is increased compared to March. Review of the MIP images confirms the above findings. IMPRESSION: 1. Small volume of chronic and nonocclusive PE remains in some of the right lower lobe branches, with clearance of the saddle embolus seen in February, and no convincing acute pulmonary embolus today. 2. Mild bilateral upper lobe gas trapping and atelectasis. 3. No metastatic disease identified in the chest. Electronically Signed   By: Genevie Ann M.D.   On: 01/02/2021 07:50   US Venous Img Lower Bilateral (DVT)  Result Date: 01/02/2021 CLINICAL DATA:  Bilateral lower extremity  swelling. EXAM: BILATERAL LOWER EXTREMITY VENOUS DOPPLER ULTRASOUND TECHNIQUE: Gray-scale sonography with compression, as well as color and duplex ultrasound, were performed to evaluate the deep venous system(s) from the level of the common femoral vein through the popliteal and proximal calf veins. COMPARISON:  Ultrasound 08/19/2020 and 08/18/2020 FINDINGS: VENOUS This was a  suboptimal exam due to patient's body habitus and pain. Patient would not allow evaluation of the popliteal veins and calf veins due to pain. Extensive occlusive thrombus noted throughout both the femoral venous systems. No other focal abnormality identified. OTHER None. IMPRESSION: This is a suboptimal exam due to patient's body habitus and pain. Patient would not allow evaluation of the popliteal veins and calf veins due to pain. Extensive occlusive thrombus noted throughout both femoral venous systems. Critical Value/emergent results were called by telephone at the time of interpretation on 01/02/2021 at 8:17 am to provider Dr. Joni Fears , who verbally acknowledged these results. Electronically Signed   By: Marcello Moores  Register   On: 01/02/2021 08:20      Assessment/Plan 1.  Bilateral lower extremity DVTs.  I suspect these are occlusive and continues all the way up to the IVC filter bilaterally.  Her legs are quite swollen and she does have a lot of pain.  Thrombectomy is a reasonable option at least to clear out the iliac and IVC if possible.  I discussed the risks and benefits of the procedure and the patient is agreeable to proceed. 2.  Inoperable uterine cancer.  This clearly is making her hypercoagulable as well as creating pelvic compression.  This is a major factor in her thrombotic process. 3. Diabetes. Stable on outpatient medications and blood glucose control important in reducing the progression of atherosclerotic disease. Also, involved in wound healing. On appropriate medications. 4.  Hypertension. Stable on outpatient  medications and blood pressure control important in reducing the progression of atherosclerotic disease. On appropriate oral medications.    Leotis Pain, MD  01/02/2021 4:22 PM    This note was created with Dragon medical transcription system.  Any error is purely unintentional

## 2021-01-02 NOTE — ED Notes (Signed)
Unable to reach signature pad, pt verbalized understanding of leaving prior to being seen by provider

## 2021-01-02 NOTE — ED Triage Notes (Signed)
4 weeks swelling in the legs, worse over the past week. Shortness of breath with exertion.

## 2021-01-02 NOTE — Consult Note (Signed)
Courtney Mack for heparin infusion Indication: DVT and PE  Patient Measurements: Heparin Dosing Weight: 66 kg  Labs: Recent Labs    01/02/21 0534 01/02/21 1051 01/02/21 1720  HGB 10.6*  --   --   HCT 32.6*  --   --   PLT 306  --   --   APTT  --  26  --   LABPROT  --  14.9  --   INR  --  1.2  --   HEPARINUNFRC  --  <0.10* <0.10*  CREATININE 0.98  --   --   TROPONINIHS 4  --   --     Estimated Creatinine Clearance: 57.8 mL/min (by C-G formula based on SCr of 0.98 mg/dL).   Medical History: Past Medical History:  Diagnosis Date   Diabetes mellitus without complication ([place])    DVT (deep venous thrombosis) ([place]) 09/06/2020   High cholesterol    Hx of blood clots    Hypertension    IDA (iron deficiency anemia) 09/21/2020    Assessment: Courtney Mack is a 65 YO female who presented to the ED on 01/02/21 with leg swelling and SOB. Doppler found thrombus throughout both femoral vein systems. Courtney Mack was on Xarelto PTA and had a dose last night. Vascular is following and planning to perform thrombectomy.  Baseline aPTT 26s, INR 1.2, HL <0.10. Baseline CBC notable for mild anemia  6/28 1058 IV heparin 2000 unit bolus followed by continuous infusion at 1050 units/hr 6/28 1720 HL < 0.10 on 1050 units/hr  Goal of Therapy:  Heparin level 0.3-0.7 units/ml Monitor platelets by anticoagulation protocol: Yes   Plan:  --Heparin level is subtherapeutic --Heparin 2000 unit IV bolus and increase infusion rate to 1300 units/hr --Re-check HL 6 hours after rate change --Daily CBC per protocol while on IV heparin  [name] 01/02/2021,6:57 PM

## 2021-01-02 NOTE — ED Notes (Signed)
US at the bedside

## 2021-01-02 NOTE — H&P (Signed)
History and Physical    Courtney Mack:[id number] DOB: 10-26-1955 DOA: 01/02/2021  PCP: [facility] [place]   Patient coming from: Home  I have personally briefly reviewed patient's old medical records in [place]  Chief Complaint: Pain in both lower extremities   HPI: Courtney Mack is a 65 y.o. female with medical history significant for diabetes mellitus, history of venous thromboembolic disease on anticoagulation therapy, morbid obesity, history of endometrial cancer who presents to the emergency room for evaluation of pain and swelling in both legs for 1 month.  Pain is worse with ambulation.  Patient also complains of exertional shortness of breath but denies having any chest pain.  She denies having any loss of consciousness, no recent trauma, no recent immobilization or any changes in her medication. She denies having any palpitations, no diaphoresis, no headache, no fever, no chills, no dizziness, no lightheadedness, no changes in her bowel habits, no urinary symptoms, no cough, no blurred vision or focal deficits. Patient was hospitalized in February 2022 for left lower extremity DVT, extensive bilateral PE status post embolectomy and IVC filter placement.  She was discharged home at that time on Xarelto. In March, 2022 patient was diagnosed with endometrial carcinoma after she developed frequent vaginal bleeding.  She is status post radiation therapy as well as brachytherapy and states that she is cancer free. Labs show sodium 136, potassium 3.9, chloride 103, bicarb 23, glucose 140, BUN 13, creatinine 0.98, calcium 8.6, BNP 87.9, troponin 4, lactic acid 1.4, prolactin 0.23, white count 6.7, hemoglobin 10.6, hematocrit 32.6, MCV 90.3, RDW 17.5, platelet count 306 Respiratory viral panel is negative Chest x-ray reviewed by me shows questionable nodular opacity right lung base. Follow-up PA lateral chest x-ray suggested for further evaluation. Lower  extremity venous Doppler shows Extensive occlusive thrombus noted throughout both femoral venous systems. CT angiogram of the chest shows small volume of chronic and nonocclusive PE remains in some of the right lower lobe branches, with clearance of the saddle embolus seen in February, and no convincing acute pulmonary embolus today. Mild bilateral upper lobe gas trapping and atelectasis. No metastatic disease identified in the chest. Twelve-lead EKG reviewed by me shows normal sinus rhythm with low voltage QRS.   ED Course: Patient is a 65 year old female who presents to the ER for evaluation of bilateral lower extremity swelling and pain and is found to have extensive occlusive thrombus throughout the femoral venous system. Patient has been started on a heparin drip and vascular surgery has been consulted. She will be admitted to the hospital for further evaluation.   Review of Systems: As per HPI otherwise all other systems reviewed and negative.    Past Medical History:  Diagnosis Date   Diabetes mellitus without complication ([place])    DVT (deep venous thrombosis) ([place]) 09/06/2020   High cholesterol    Hx of blood clots    Hypertension    IDA (iron deficiency anemia) 09/21/2020    Past Surgical History:  Procedure Laterality Date   IVC FILTER INSERTION N/A 08/18/2020   Procedure: IVC FILTER INSERTION;  Surgeon: Katha Cabal, MD;  Location: [facility];  Service: Cardiovascular;  Laterality: N/A;   PULMONARY THROMBECTOMY N/A 08/18/2020   Procedure: PULMONARY THROMBECTOMY;  Surgeon: Katha Cabal, MD;  Location: [facility];  Service: Cardiovascular;  Laterality: N/A;   TUBAL LIGATION       reports that she quit smoking about 40 years ago. Her smoking  use included cigarettes. She has a 0.13 pack-year smoking history. She has never used smokeless tobacco. She reports previous alcohol use. She reports previous drug use.  No Known Allergies  Family History   Problem Relation Age of Onset   Hypertension Mother    Diabetes Mother    Diabetes Father    Hypertension Father    High Cholesterol Father    Congestive Heart Failure Father       Prior to Admission medications   Medication Sig Start Date End Date Taking? Authorizing Provider  dexamethasone (DECADRON) 4 MG tablet Take 2 tablets (8 mg total) by mouth daily. Take daily x 2 days starting the day after cisplatin chemotherapy. Take with food. Patient not taking: No sig reported 09/21/20   Earlie Server, MD  Ensure Max Protein (ENSURE MAX PROTEIN) LIQD Take 330 mLs (11 oz total) by mouth 2 (two) times daily. 09/11/20   [name], MD  Fluticasone-Salmeterol (ADVAIR) 100-50 MCG/DOSE AEPB Inhale 1 puff into the lungs 2 (two) times daily. Rinse mouth after each use    [provider]  ketoconazole (NIZORAL) 2 % cream Apply 1 application topically daily. 10/11/20   Noreene Filbert, MD  losartan (COZAAR) 25 MG tablet Take 1 tablet (25 mg total) by mouth daily. 10/03/20   Earlie Server, MD  metFORMIN (GLUCOPHAGE) 500 MG tablet Take 1 tablet (500 mg total) by mouth 2 (two) times daily with a meal. 10/03/20   Earlie Server, MD  Multiple Vitamin (MULTIVITAMIN WITH MINERALS) TABS tablet Take 1 tablet by mouth daily. 09/12/20   [name], MD  norethindrone (AYGESTIN) 5 MG tablet Take 1 tablet (5 mg total) by mouth 3 (three) times daily. 09/11/20   [name], MD  nystatin (MYCOSTATIN/NYSTOP) powder Apply topically 2 (two) times daily. 08/24/20   [name], MD  ondansetron (ZOFRAN) 4 MG tablet Take 1 tablet (4 mg total) by mouth every 6 (six) hours as needed for nausea. 08/24/20   [name], MD  prochlorperazine (COMPAZINE) 10 MG tablet Take 1 tablet (10 mg total) by mouth every 6 (six) hours as needed (Nausea or vomiting). 09/21/20   Earlie Server, MD  rivaroxaban (XARELTO) 20 MG TABS tablet Take 1 tablet (20 mg total) by mouth daily with supper. 10/03/20   Earlie Server, MD  rosuvastatin (CRESTOR)  10 MG tablet Take 1 tablet (10 mg total) by mouth daily. 10/03/20   Earlie Server, MD    Physical Exam: Vitals:   01/02/21 1019 01/02/21 1030 01/02/21 1100 01/02/21 1130  BP:  135/67 127/65 (!) 120/58  Pulse:  76 73 79  Resp:  (!) 23 (!) 23 (!) 30  Temp:      TempSrc:      SpO2:  98% 97% 99%  Weight: 100.7 kg     Height: 4\' 10"  (1.473 m)        Vitals:   01/02/21 1019 01/02/21 1030 01/02/21 1100 01/02/21 1130  BP:  135/67 127/65 (!) 120/58  Pulse:  76 73 79  Resp:  (!) 23 (!) 23 (!) 30  Temp:      TempSrc:      SpO2:  98% 97% 99%  Weight: 100.7 kg     Height: 4\' 10"  (1.473 m)         Constitutional: Alert and oriented x 3 . Not in any apparent distress HEENT:      Head: Normocephalic and atraumatic.         Eyes: PERLA,  EOMI, Conjunctivae are normal. Sclera is non-icteric.       Mouth/Throat: Mucous membranes are moist.       Neck: Supple with no signs of meningismus. Cardiovascular: Regular rate and rhythm. No murmurs, gallops, or rubs. 2+ symmetrical distal pulses are present . No JVD. 4+ LE edema Respiratory: Respiratory effort normal .Lungs sounds clear bilaterally. No wheezes, crackles, or rhonchi.  Gastrointestinal: Soft, non tender, and non distended with positive bowel sounds.  Genitourinary: No CVA tenderness. Musculoskeletal: Nontender with normal range of motion in all extremities. No cyanosis, or erythema of extremities. Neurologic:  Face is symmetric. Moving all extremities. No gross focal neurologic deficits . Skin: Skin is warm, dry.  No rash or ulcers Psychiatric: Mood and affect are normal    Labs on Admission: I have personally reviewed following labs and imaging studies  CBC: Recent Labs  Lab 01/02/21 0534  WBC 6.7  HGB 10.6*  HCT 32.6*  MCV 90.3  PLT 277   Basic Metabolic Panel: Recent Labs  Lab 01/02/21 0534  NA 136  K 3.9  CL 103  CO2 23  GLUCOSE 140*  BUN 13  CREATININE 0.98  CALCIUM 8.6*   GFR: Estimated Creatinine Clearance:  57.8 mL/min (by C-G formula based on SCr of 0.98 mg/dL). Liver Function Tests: No results for input(s): AST, ALT, ALKPHOS, BILITOT, PROT, ALBUMIN in the last 168 hours. No results for input(s): LIPASE, AMYLASE in the last 168 hours. No results for input(s): AMMONIA in the last 168 hours. Coagulation Profile: No results for input(s): INR, PROTIME in the last 168 hours. Cardiac Enzymes: No results for input(s): CKTOTAL, CKMB, CKMBINDEX, TROPONINI in the last 168 hours. BNP (last 3 results) No results for input(s): PROBNP in the last 8760 hours. HbA1C: No results for input(s): HGBA1C in the last 72 hours. CBG: No results for input(s): GLUCAP in the last 168 hours. Lipid Profile: No results for input(s): CHOL, HDL, LDLCALC, TRIG, CHOLHDL, LDLDIRECT in the last 72 hours. Thyroid Function Tests: No results for input(s): TSH, T4TOTAL, FREET4, T3FREE, THYROIDAB in the last 72 hours. Anemia Panel: No results for input(s): VITAMINB12, FOLATE, FERRITIN, TIBC, IRON, RETICCTPCT in the last 72 hours. Urine analysis: No results found for: COLORURINE, APPEARANCEUR, LABSPEC, [place], GLUCOSEU, HGBUR, BILIRUBINUR, KETONESUR, PROTEINUR, UROBILINOGEN, NITRITE, LEUKOCYTESUR  Radiological Exams on Admission: DG Chest 2 View  Result Date: 01/02/2021 CLINICAL DATA:  Shortness of breath. EXAM: CHEST - 2 VIEW COMPARISON:  10/27/2020.  PET-CT 09/28/2020.  CT chest 08/18/2020. FINDINGS: Mediastinum and hilar structures normal. Heart size normal. Questionable nodular opacity right lung base. Follow-up PA and lateral chest x-ray suggested for further evaluation. No pleural effusion or pneumothorax. Degenerative change thoracic spine. IMPRESSION: Questionable nodular opacity right lung base. Follow-up PA lateral chest x-ray suggested for further evaluation. Electronically Signed   By: Marcello Moores  Register   On: 01/02/2021 06:05   CT Angio Chest PE W/Cm &/Or Wo Cm  Result Date: 01/02/2021 CLINICAL DATA:  65 year old  female with bilateral lower extremity edema and shortness of breath. Endometrial cancer. Bilateral pulmonary emboli in February this year. EXAM: CT ANGIOGRAPHY CHEST WITH CONTRAST TECHNIQUE: Multidetector CT imaging of the chest was performed using the standard protocol during bolus administration of intravenous contrast. Multiplanar CT image reconstructions and MIPs were obtained to evaluate the vascular anatomy. CONTRAST:  12mL OMNIPAQUE IOHEXOL 350 MG/ML SOLN COMPARISON:  PET-CT 09/28/2020 chest CTA 08/18/2020. FINDINGS: Cardiovascular: Good contrast bolus timing in the pulmonary arterial tree. Clearance of the saddle embolus demonstrated in February. Very  small volume of chronic PE suspected at the arborization of the right lower lung pulmonary arteries (series 5, images 130 through 146. Similar nonocclusive thrombus adherent to the wall in the right lower lobe segmental branch on image 169, and that clot also bifurcates distally. Those arteries were completely opacified in February and now appear patent. No pulmonary artery occlusion or convincing acute pulmonary embolus identified today. Cardiac size within normal limits. No pericardial effusion. Negative visible aorta aside from mild calcified atherosclerosis. No definite calcified coronary artery plaque. Mediastinum/Nodes: No mediastinal lymphadenopathy. Lungs/Pleura: Major airways are patent with mild upper lobe mosaic attenuation in both lungs. Patchy additional distal peribronchial opacity in the anterior inferior upper lobe, most resembles atelectasis. No pleural effusion or consolidation. Upper Abdomen: Negative visible liver, gallbladder, spleen, pancreas, adrenal glands, kidneys, and bowel in the upper abdomen. Musculoskeletal: No acute or suspicious osseous lesion identified. Dependent body wall edema is increased compared to March. Review of the MIP images confirms the above findings. IMPRESSION: 1. Small volume of chronic and nonocclusive PE  remains in some of the right lower lobe branches, with clearance of the saddle embolus seen in February, and no convincing acute pulmonary embolus today. 2. Mild bilateral upper lobe gas trapping and atelectasis. 3. No metastatic disease identified in the chest. Electronically Signed   By: Genevie Ann M.D.   On: 01/02/2021 07:50   US Venous Img Lower Bilateral (DVT)  Result Date: 01/02/2021 CLINICAL DATA:  Bilateral lower extremity swelling. EXAM: BILATERAL LOWER EXTREMITY VENOUS DOPPLER ULTRASOUND TECHNIQUE: Gray-scale sonography with compression, as well as color and duplex ultrasound, were performed to evaluate the deep venous system(s) from the level of the common femoral vein through the popliteal and proximal calf veins. COMPARISON:  Ultrasound 08/19/2020 and 08/18/2020 FINDINGS: VENOUS This was a suboptimal exam due to patient's body habitus and pain. Patient would not allow evaluation of the popliteal veins and calf veins due to pain. Extensive occlusive thrombus noted throughout both the femoral venous systems. No other focal abnormality identified. OTHER None. IMPRESSION: This is a suboptimal exam due to patient's body habitus and pain. Patient would not allow evaluation of the popliteal veins and calf veins due to pain. Extensive occlusive thrombus noted throughout both femoral venous systems. Critical Value/emergent results were called by telephone at the time of interpretation on 01/02/2021 at 8:17 am to provider Dr. Joni Fears , who verbally acknowledged these results. Electronically Signed   By: Marcello Moores  Register   On: 01/02/2021 08:20     Assessment/Plan Principal Problem:   Acute bilateral deep vein thrombosis (DVT) of femoral veins ([facility]) Active Problems:   Obesity, morbid, BMI 50 or higher ([place])   Essential hypertension   Diabetes mellitus without complication ([place])   Endometrial cancer ([place])     Acute bilateral deep vein thrombosis Patient presents for evaluation of bilateral lower  extremity swelling and pain and is noted to have extensive DVT involving the femoral veins. She has a history of venous thromboembolic disease and was diagnosed with acute PE and DVT in February, 2022 She is status post thrombectomy and was placed on Xarelto Hold Xarelto for now Continue heparin drip initiated in the ER Vascular surgery consult for possible thrombectomy    Diabetes mellitus without complications Maintain consistent carbohydrate diet Hold metformin Glycemic control with sliding scale insulin    Morbid obesity (BMI 50) Complicates overall prognosis and care    Hypertension Continue losartan    History of endometrial carcinoma Status post radiation and brachytherapy Follow-up with  oncology as an outpatient   DVT prophylaxis: Heparin CODE STATUS: Full code Family Communication: Greater than 50% of time was spent discussing plan of care with patient at the bedside.  All questions and concerns have been addressed.  She verbalizes understanding and agrees with the plan. Disposition Plan: Back to previous home environment Consults called: Vascular surgery Status: At the time of admission, it appears that the appropriate admission status for this patient is inpatient. This is judged to be reasonable and necessary in order to provide the required intensity of service to ensure the patient's safety given the presenting symptoms, physical exam findings, and initial radiographic and laboratory data in the context of their comorbid conditions. Patient requires inpatient status due to high intensity of service, high risk for further deterioration and high frequency of surveillance required.    Collier Bullock MD [facility]     01/02/2021, 12:10 PM

## 2021-01-02 NOTE — ED Provider Notes (Signed)
Procedures  Clinical Course as of 01/02/21 1125  Tue Jan 02, 2021  0703 Care transferred to Dr. Joni Mack at change of shift pending laboratory and imaging results. [JS]    Clinical Course User Index [JS] Courtney Blanch, MD    ----------------------------------------- 11:25 AM on 01/02/2021 -----------------------------------------  Ultrasounds discussed with radiology which showed extensive occlusive thrombus throughout bilateral femoral vein systems.  She has IVC filter and has been compliant with Xarelto, last dose was last night.  This may be related to brachytherapy that she had as recently as late May for her inoperable uterine cancer.  Discussed with vascular surgery Dr. Lucky Mack who will plan thrombectomy tomorrow.  Discussed with hospitalist.  Will discontinue Xarelto, start heparin.    Courtney Mew, MD 01/02/21 1126

## 2021-01-02 NOTE — ED Notes (Signed)
Updated pt's daughter, Lucita Lora at this time. Updated pt contact.

## 2021-01-02 NOTE — ED Notes (Signed)
Pt placed in hospital bed with assistance of Caryl Pina, RN and [place], Therapist, sports. Pt tolerated well.

## 2021-01-02 NOTE — ED Notes (Signed)
Patient transported to CT 

## 2021-01-02 NOTE — ED Provider Notes (Signed)
[facility] Emergency Department Provider Note   ____________________________________________   Event Date/Time   First MD Initiated Contact with Patient 01/02/21 0559     (approximate)  I have reviewed the triage vital signs and the nursing notes.   HISTORY  Chief Complaint Leg Swelling    HPI Courtney Mack is a 65 y.o. female brought to the ED via EMS from home with a chief complaint of BLE edema, shortness of breath with exertion.  Patient with a history of diabetes, DVT/PE in March/2022 status post thrombectomy on Xarelto.  Reports over the last month she has developed increasing edema in both her legs.  Difficulty ambulating now even with a walker.  Denies fever, cough, chest pain, abdominal pain, nausea, vomiting or dizziness.     Past Medical History:  Diagnosis Date   Diabetes mellitus without complication (HCC)    DVT (deep venous thrombosis) ([place]) 09/06/2020   High cholesterol    Hx of blood clots    Hypertension    IDA (iron deficiency anemia) 09/21/2020    Patient Active Problem List   Diagnosis Date Noted   IDA (iron deficiency anemia) 09/21/2020   Endometrial cancer ([place]) 09/13/2020   Acute blood loss anemia 09/06/2020   Vaginal bleeding 09/06/2020   DVT (deep venous thrombosis) ([place]) 09/06/2020   Anemia, unspecified 08/24/2020   Morbidly obese ([place]) 08/23/2020   Essential hypertension 08/23/2020   New onset type 2 diabetes mellitus ([place]) 08/23/2020   Dyslipidemia 08/23/2020   Acute respiratory failure with hypoxia ([place])    Pulmonary embolism ([place]) 08/18/2020   Vaginal bleeding, abnormal 08/18/2020   Obesity, morbid, BMI 50 or higher ([place]) 08/18/2020    Past Surgical History:  Procedure Laterality Date   IVC FILTER INSERTION N/A 08/18/2020   Procedure: IVC FILTER INSERTION;  Surgeon: Katha Cabal, MD;  Location: [facility];  Service: Cardiovascular;  Laterality: N/A;   PULMONARY THROMBECTOMY N/A 08/18/2020    Procedure: PULMONARY THROMBECTOMY;  Surgeon: Katha Cabal, MD;  Location: [place];  Service: Cardiovascular;  Laterality: N/A;   TUBAL LIGATION      Prior to Admission medications   Medication Sig Start Date End Date Taking? Authorizing Provider  dexamethasone (DECADRON) 4 MG tablet Take 2 tablets (8 mg total) by mouth daily. Take daily x 2 days starting the day after cisplatin chemotherapy. Take with food. Patient not taking: No sig reported 09/21/20   Earlie Server, MD  Ensure Max Protein (ENSURE MAX PROTEIN) LIQD Take 330 mLs (11 oz total) by mouth 2 (two) times daily. 09/11/20   [name], MD  Fluticasone-Salmeterol (ADVAIR) 100-50 MCG/DOSE AEPB Inhale 1 puff into the lungs 2 (two) times daily. Rinse mouth after each use    [provider]  ketoconazole (NIZORAL) 2 % cream Apply 1 application topically daily. 10/11/20   Noreene Filbert, MD  losartan (COZAAR) 25 MG tablet Take 1 tablet (25 mg total) by mouth daily. 10/03/20   Earlie Server, MD  metFORMIN (GLUCOPHAGE) 500 MG tablet Take 1 tablet (500 mg total) by mouth 2 (two) times daily with a meal. 10/03/20   Earlie Server, MD  Multiple Vitamin (MULTIVITAMIN WITH MINERALS) TABS tablet Take 1 tablet by mouth daily. 09/12/20   [name], MD  norethindrone (AYGESTIN) 5 MG tablet Take 1 tablet (5 mg total) by mouth 3 (three) times daily. 09/11/20   [name], MD  nystatin (MYCOSTATIN/NYSTOP) powder Apply topically 2 (two) times daily. 08/24/20  [name], MD  ondansetron (ZOFRAN) 4 MG tablet Take 1 tablet (4 mg total) by mouth every 6 (six) hours as needed for nausea. 08/24/20   [name], MD  prochlorperazine (COMPAZINE) 10 MG tablet Take 1 tablet (10 mg total) by mouth every 6 (six) hours as needed (Nausea or vomiting). 09/21/20   Earlie Server, MD  rivaroxaban (XARELTO) 20 MG TABS tablet Take 1 tablet (20 mg total) by mouth daily with supper. 10/03/20   Earlie Server, MD  rosuvastatin (CRESTOR) 10 MG tablet Take  1 tablet (10 mg total) by mouth daily. 10/03/20   Earlie Server, MD    Allergies Patient has no known allergies.  Family History  Problem Relation Age of Onset   Hypertension Mother    Diabetes Mother    Diabetes Father    Hypertension Father    High Cholesterol Father    Congestive Heart Failure Father     Social History Social History   Tobacco Use   Smoking status: Former    Packs/day: 0.25    Years: 0.50    Pack years: 0.13    Types: Cigarettes    Quit date: 09/21/1980    Years since quitting: 40.3   Smokeless tobacco: Never   Tobacco comments:    smoked 2-4 cigarettes for about 2-3 weeks   Vaping Use   Vaping Use: Never used  Substance Use Topics   Alcohol use: Not Currently   Drug use: Not Currently    Review of Systems  Constitutional: No fever/chills Eyes: No visual changes. ENT: No sore throat. Cardiovascular: Denies chest pain. Respiratory: Positive for shortness of breath. Gastrointestinal: No abdominal pain.  No nausea, no vomiting.  No diarrhea.  No constipation. Genitourinary: Negative for dysuria. Musculoskeletal: Positive for BLE swelling.  Negative for back pain. Skin: Negative for rash. Neurological: Negative for headaches, focal weakness or numbness.   ____________________________________________   PHYSICAL EXAM:  VITAL SIGNS: ED Triage Vitals  Enc Vitals Group     BP 01/02/21 0534 (!) 142/79     Pulse Rate 01/02/21 0534 92     Resp 01/02/21 0534 (!) 24     Temp 01/02/21 0534 98.1 F (36.7 C)     Temp Source 01/02/21 0534 Oral     SpO2 01/02/21 0534 95 %     Weight --      Height --      Head Circumference --      Peak Flow --      Pain Score 01/02/21 0532 10     Pain Loc --      Pain Edu? --      Excl. in [place]? --     Constitutional: Alert and oriented.  Chronically ill appearing and in mild acute distress. Eyes: Conjunctivae are normal. PERRL. EOMI. Head: Atraumatic. Nose: No congestion/rhinnorhea. Mouth/Throat: Mucous  membranes are moist.   Neck: No stridor.   Cardiovascular: Normal rate, regular rhythm. Grossly normal heart sounds.  Good peripheral circulation. Respiratory: Normal respiratory effort.  No retractions. Lungs CTAB. Gastrointestinal: Soft and nontender to light or deep palpation. No distention. No abdominal bruits. No CVA tenderness. Musculoskeletal: No lower extremity tenderness. BLE lymphedema with warmth and erythema to creases at the knees with intertriginous rash.  No joint effusions.  Palpable distal pulses.   Neurologic:  Normal speech and language. No gross focal neurologic deficits are appreciated.  Skin:  Skin is warm, dry and intact. No rash noted. Psychiatric: Mood and affect are normal. Speech  and behavior are normal.  ____________________________________________   LABS (all labs ordered are listed, but only abnormal results are displayed)  Labs Reviewed  CBC - Abnormal; Notable for the following components:      Result Value   RBC 3.61 (*)    Hemoglobin 10.6 (*)    HCT 32.6 (*)    RDW 17.5 (*)    All other components within normal limits  BASIC METABOLIC PANEL - Abnormal; Notable for the following components:   Glucose, Bld 140 (*)    Calcium 8.6 (*)    All other components within normal limits  CULTURE, BLOOD (ROUTINE X 2)  CULTURE, BLOOD (ROUTINE X 2)  RESP PANEL BY RT-PCR (FLU A&B, COVID) ARPGX2  BRAIN NATRIURETIC PEPTIDE  LACTIC ACID, PLASMA  LACTIC ACID, PLASMA  PROCALCITONIN  TROPONIN I (HIGH SENSITIVITY)   ____________________________________________  EKG  ED ECG REPORT I, Viviana Trimble J, the attending physician, personally viewed and interpreted this ECG.   Date: 01/02/2021  EKG Time: 0541  Rate: 82  Rhythm: normal EKG, normal sinus rhythm  Axis: Normal  Intervals:none  ST&T Change: Nonspecific  ____________________________________________  RADIOLOGY I, Jazmarie Biever J, personally viewed and evaluated these images (plain radiographs) as part of  my medical decision making, as well as reviewing the written report by the radiologist.  ED MD interpretation:  ?  Right lung base nodular opacity; CT and ultrasound pending  Official radiology report(s): DG Chest 2 View  Result Date: 01/02/2021 CLINICAL DATA:  Shortness of breath. EXAM: CHEST - 2 VIEW COMPARISON:  10/27/2020.  PET-CT 09/28/2020.  CT chest 08/18/2020. FINDINGS: Mediastinum and hilar structures normal. Heart size normal. Questionable nodular opacity right lung base. Follow-up PA and lateral chest x-ray suggested for further evaluation. No pleural effusion or pneumothorax. Degenerative change thoracic spine. IMPRESSION: Questionable nodular opacity right lung base. Follow-up PA lateral chest x-ray suggested for further evaluation. Electronically Signed   By: Marcello Moores  Register   On: 01/02/2021 06:05    ____________________________________________   PROCEDURES  Procedure(s) performed (including Critical Care):  .1-3 Lead EKG Interpretation  Date/Time: 01/02/2021 6:26 AM Performed by: Paulette Blanch, MD Authorized by: Paulette Blanch, MD     Interpretation: normal     ECG rate:  82   ECG rate assessment: normal     Rhythm: sinus rhythm     Ectopy: none     Conduction: normal   Comments:     Patient placed on cardiac monitor to evaluate for arrhythmias   ____________________________________________   INITIAL IMPRESSION / ASSESSMENT AND PLAN / ED COURSE  As part of my medical decision making, I reviewed the following data within the [facility] notes reviewed and incorporated, Labs reviewed, EKG interpreted, Old chart reviewed, Radiograph reviewed, and Notes from prior ED visits     65 year old female presenting with BLE edema and shortness of breath; history of DVT/PE status post thrombectomy this spring on Xarelto. Differential includes, but is not limited to, viral syndrome, bronchitis including COPD exacerbation, pneumonia, reactive airway  disease including asthma, CHF including exacerbation with or without pulmonary/interstitial edema, pneumothorax, ACS, thoracic trauma, and pulmonary embolism.   Will obtain cardiac lab work, BLE DVT ultrasounds, CTA chest to evaluate for PE.  Administer IV morphine for leg pain.  Apply nystatin ointment for intertriginous rash.  Clinical Course as of 01/02/21 0703  Tue Jan 02, 2021  0703 Care transferred to Dr. Joni Fears at change of shift pending laboratory and imaging results. [JS]  Clinical Course User Index [JS] Paulette Blanch, MD     ____________________________________________   FINAL CLINICAL IMPRESSION(S) / ED DIAGNOSES  Final diagnoses:  Lymphedema  Intertrigo     ED Discharge Orders     None        Note:  This document was prepared using Dragon voice recognition software and may include unintentional dictation errors.    Paulette Blanch, MD 01/02/21 [phone]

## 2021-01-02 NOTE — Consult Note (Addendum)
ANTICOAGULATION CONSULT NOTE - Initial Consult  Pharmacy Consult for heparin infusion Indication: DVT and PE  No Known Allergies  Patient Measurements: Height: 4\' 10"  (147.3 cm) Weight: 100.7 kg (222 lb) IBW/kg (Calculated) : 40.9 Heparin Dosing Weight: 66 kg  Vital Signs: Temp: 98.1 F (36.7 C) (06/28 0534) Temp Source: Oral (06/28 0534) BP: 120/58 (06/28 1130) Pulse Rate: 79 (06/28 1130)  Labs: Recent Labs    01/02/21 0534  HGB 10.6*  HCT 32.6*  PLT 306  CREATININE 0.98  TROPONINIHS 4    Estimated Creatinine Clearance: 57.8 mL/min (by C-G formula based on SCr of 0.98 mg/dL).   Medical History: Past Medical History:  Diagnosis Date   Diabetes mellitus without complication ([place])    DVT (deep venous thrombosis) ([place]) 09/06/2020   High cholesterol    Hx of blood clots    Hypertension    IDA (iron deficiency anemia) 09/21/2020    Medications:  (Not in a hospital admission)   Assessment: KM is a 65 YO female who presented to the ED on 01/02/21 with leg swelling and SOB. Doppler found thrombus throughout both femoral vein systems. KM was on Xarelto PTA and had a dose last night.  Goal of Therapy:  aPTT 66-102 seconds Monitor platelets by anticoagulation protocol: Yes Heparin level 0.3-0.7 once aPTT and HL correlate   Plan:  Give 2000 unit bolus x 1 (half dose due to Xarelto dose last night) Start heparin infusion at 1050 units/hr Recheck aPTT in 6 hours and monitor HL and CBC daily.  Geraldo Docker PharmD Candidate at [facility] 01/02/2021,11:46 AM

## 2021-01-02 NOTE — Consult Note (Signed)
[facility] Consult Note  MRN : [id number]  Courtney Mack is a 65 y.o. (09/10/1955) female who presents with chief complaint of  Chief Complaint  Patient presents with   Leg Swelling  .  History of Present Illness: Patient presents with marked pain and swelling in both lower extremities.  She says this is going on for a few weeks now she has a previous history of DVT and IVC filter placement and has been compliant with Xarelto.  She has inoperable uterine cancer.  No open ulcerations or infection.  No fevers or chills.  Current Facility-Administered Medications  Medication Dose Route Frequency Provider Last Rate Last Admin   0.9 %  sodium chloride infusion  250 mL Intravenous PRN Agbata, Tochukwu, MD       acetaminophen (TYLENOL) tablet 650 mg  650 mg Oral Q6H PRN Agbata, Tochukwu, MD       Or   acetaminophen (TYLENOL) suppository 650 mg  650 mg Rectal Q6H PRN Agbata, Tochukwu, MD       heparin ADULT infusion 100 units/mL (25000 units/28mL)  1,050 Units/hr Intravenous Continuous Oswald Hillock, RPH 10.5 mL/hr at 01/02/21 1058 1,050 Units/hr at 01/02/21 1058   insulin aspart (novoLOG) injection 0-20 Units  0-20 Units Subcutaneous TID WC Agbata, Tochukwu, MD       losartan (COZAAR) tablet 25 mg  25 mg Oral Daily Agbata, Tochukwu, MD   25 mg at 01/02/21 1610   mometasone-formoterol (DULERA) 100-5 MCG/ACT inhaler 2 puff  2 puff Inhalation BID Agbata, Tochukwu, MD       morphine 2 MG/ML injection 2 mg  2 mg Intravenous Q4H PRN Agbata, Tochukwu, MD       ondansetron (ZOFRAN) tablet 4 mg  4 mg Oral Q6H PRN Agbata, Tochukwu, MD       Or   ondansetron (ZOFRAN) injection 4 mg  4 mg Intravenous Q6H PRN Agbata, Tochukwu, MD       rosuvastatin (CRESTOR) tablet 10 mg  10 mg Oral Daily Agbata, Tochukwu, MD   10 mg at 01/02/21 1610   sodium chloride flush (NS) 0.9 % injection 3 mL  3 mL Intravenous Q12H Agbata, Tochukwu, MD       sodium chloride flush (NS) 0.9 %  injection 3 mL  3 mL Intravenous PRN Agbata, Tochukwu, MD        Past Medical History:  Diagnosis Date   Diabetes mellitus without complication ([place])    DVT (deep venous thrombosis) ([place]) 09/06/2020   High cholesterol    Hx of blood clots    Hypertension    IDA (iron deficiency anemia) 09/21/2020    Past Surgical History:  Procedure Laterality Date   IVC FILTER INSERTION N/A 08/18/2020   Procedure: IVC FILTER INSERTION;  Surgeon: Katha Cabal, MD;  Location: [facility];  Service: Cardiovascular;  Laterality: N/A;   PULMONARY THROMBECTOMY N/A 08/18/2020   Procedure: PULMONARY THROMBECTOMY;  Surgeon: Katha Cabal, MD;  Location: [facility];  Service: Cardiovascular;  Laterality: N/A;   TUBAL LIGATION      Social History Social History   Tobacco Use   Smoking status: Former    Packs/day: 0.25    Years: 0.50    Pack years: 0.13    Types: Cigarettes    Quit date: 09/21/1980    Years since quitting: 40.3   Smokeless tobacco: Never   Tobacco comments:    smoked 2-4 cigarettes for about 2-3 weeks  Vaping Use   Vaping Use: Never used  Substance Use Topics   Alcohol use: Not Currently   Drug use: Not Currently    Family History Family History  Problem Relation Age of Onset   Hypertension Mother    Diabetes Mother    Diabetes Father    Hypertension Father    High Cholesterol Father    Congestive Heart Failure Father     No Known Allergies   REVIEW OF SYSTEMS (Negative unless checked)  Constitutional: [] Weight loss  [] Fever  [] Chills Cardiac: [] Chest pain   [] Chest pressure   [] Palpitations   [] Shortness of breath when laying flat   [] Shortness of breath at rest   [x] Shortness of breath with exertion. Vascular:  [x] Pain in legs with walking   [x] Pain in legs at rest   [] Pain in legs when laying flat   [] Claudication   [] Pain in feet when walking  [] Pain in feet at rest  [] Pain in feet when laying flat   [x] History of DVT   [x] Phlebitis    [x] Swelling in legs   [] Varicose veins   [] Non-healing ulcers Pulmonary:   [] Uses home oxygen   [] Productive cough   [] Hemoptysis   [] Wheeze  [] COPD   [] Asthma Neurologic:  [] Dizziness  [] Blackouts   [] Seizures   [] History of stroke   [] History of TIA  [] Aphasia   [] Temporary blindness   [] Dysphagia   [] Weakness or numbness in arms   [] Weakness or numbness in legs Musculoskeletal:  [] Arthritis   [] Joint swelling   [] Joint pain   [] Low back pain Hematologic:  [] Easy bruising  [] Easy bleeding   [x] Hypercoagulable state   [x] Anemic  [] Hepatitis Gastrointestinal:  [] Blood in stool   [] Vomiting blood  [] Gastroesophageal reflux/heartburn   [] Difficulty swallowing. Genitourinary:  [] Chronic kidney disease   [] Difficult urination  [] Frequent urination  [] Burning with urination   [] Blood in urine Skin:  [] Rashes   [] Ulcers   [] Wounds Psychological:  [] History of anxiety   []  History of major depression.  Physical Examination  Vitals:   01/02/21 1030 01/02/21 1100 01/02/21 1130 01/02/21 1354  BP: 135/67 127/65 (!) 120/58 107/60  Pulse: 76 73 79 77  Resp: (!) 23 (!) 23 (!) 30 18  Temp:    98.2 F (36.8 C)  TempSrc:    Oral  SpO2: 98% 97% 99% 99%  Weight:      Height:       Body mass index is 46.4 kg/m. Gen:  WD/WN, NAD Head: [place]/AT, No temporalis wasting.  Ear/Nose/Throat: Hearing grossly intact, nares w/o erythema or drainage, oropharynx w/o Erythema/Exudate Eyes: Sclera non-icteric, conjunctiva clear Neck: Trachea midline.  No JVD.  Pulmonary:  Good air movement, respirations not labored, equal bilaterally.  Cardiac: RRR, normal S1, S2. Vascular:  Vessel Right Left  Radial Palpable Palpable   Musculoskeletal: M/S 5/5 throughout.  Extremities without ischemic changes.  No deformity or atrophy. 3+ BLE edema. Neurologic: Sensation grossly intact in extremities.  Symmetrical.  Speech is fluent. Motor exam as listed above. Psychiatric: Judgment intact, Mood & affect appropriate for pt's  clinical situation. Dermatologic: No rashes or ulcers noted.  No cellulitis or open wounds.      CBC Lab Results  Component Value Date   WBC 6.7 01/02/2021   HGB 10.6 (L) 01/02/2021   HCT 32.6 (L) 01/02/2021   MCV 90.3 01/02/2021   PLT 306 01/02/2021    BMET    Component Value Date/Time   NA 136 01/02/2021 0534   K 3.9 01/02/2021  0534   CL 103 01/02/2021 0534   CO2 23 01/02/2021 0534   GLUCOSE 140 (H) 01/02/2021 0534   BUN 13 01/02/2021 0534   CREATININE 0.98 01/02/2021 0534   CALCIUM 8.6 (L) 01/02/2021 0534   GFRNONAA >60 01/02/2021 0534   Estimated Creatinine Clearance: 57.8 mL/min (by C-G formula based on SCr of 0.98 mg/dL).  COAG Lab Results  Component Value Date   INR 1.2 01/02/2021   INR 2.6 (H) 09/20/2020   INR 1.5 (H) 09/07/2020    Radiology DG Chest 2 View  Result Date: 01/02/2021 CLINICAL DATA:  Shortness of breath. EXAM: CHEST - 2 VIEW COMPARISON:  10/27/2020.  PET-CT 09/28/2020.  CT chest 08/18/2020. FINDINGS: Mediastinum and hilar structures normal. Heart size normal. Questionable nodular opacity right lung base. Follow-up PA and lateral chest x-ray suggested for further evaluation. No pleural effusion or pneumothorax. Degenerative change thoracic spine. IMPRESSION: Questionable nodular opacity right lung base. Follow-up PA lateral chest x-ray suggested for further evaluation. Electronically Signed   By: Marcello Moores  Register   On: 01/02/2021 06:05   CT Angio Chest PE W/Cm &/Or Wo Cm  Result Date: 01/02/2021 CLINICAL DATA:  65 year old female with bilateral lower extremity edema and shortness of breath. Endometrial cancer. Bilateral pulmonary emboli in February this year. EXAM: CT ANGIOGRAPHY CHEST WITH CONTRAST TECHNIQUE: Multidetector CT imaging of the chest was performed using the standard protocol during bolus administration of intravenous contrast. Multiplanar CT image reconstructions and MIPs were obtained to evaluate the vascular anatomy. CONTRAST:  189mL  OMNIPAQUE IOHEXOL 350 MG/ML SOLN COMPARISON:  PET-CT 09/28/2020 chest CTA 08/18/2020. FINDINGS: Cardiovascular: Good contrast bolus timing in the pulmonary arterial tree. Clearance of the saddle embolus demonstrated in February. Very small volume of chronic PE suspected at the arborization of the right lower lung pulmonary arteries (series 5, images 130 through 146. Similar nonocclusive thrombus adherent to the wall in the right lower lobe segmental branch on image 169, and that clot also bifurcates distally. Those arteries were completely opacified in February and now appear patent. No pulmonary artery occlusion or convincing acute pulmonary embolus identified today. Cardiac size within normal limits. No pericardial effusion. Negative visible aorta aside from mild calcified atherosclerosis. No definite calcified coronary artery plaque. Mediastinum/Nodes: No mediastinal lymphadenopathy. Lungs/Pleura: Major airways are patent with mild upper lobe mosaic attenuation in both lungs. Patchy additional distal peribronchial opacity in the anterior inferior upper lobe, most resembles atelectasis. No pleural effusion or consolidation. Upper Abdomen: Negative visible liver, gallbladder, spleen, pancreas, adrenal glands, kidneys, and bowel in the upper abdomen. Musculoskeletal: No acute or suspicious osseous lesion identified. Dependent body wall edema is increased compared to March. Review of the MIP images confirms the above findings. IMPRESSION: 1. Small volume of chronic and nonocclusive PE remains in some of the right lower lobe branches, with clearance of the saddle embolus seen in February, and no convincing acute pulmonary embolus today. 2. Mild bilateral upper lobe gas trapping and atelectasis. 3. No metastatic disease identified in the chest. Electronically Signed   By: Genevie Ann M.D.   On: 01/02/2021 07:50   US Venous Img Lower Bilateral (DVT)  Result Date: 01/02/2021 CLINICAL DATA:  Bilateral lower extremity  swelling. EXAM: BILATERAL LOWER EXTREMITY VENOUS DOPPLER ULTRASOUND TECHNIQUE: Gray-scale sonography with compression, as well as color and duplex ultrasound, were performed to evaluate the deep venous system(s) from the level of the common femoral vein through the popliteal and proximal calf veins. COMPARISON:  Ultrasound 08/19/2020 and 08/18/2020 FINDINGS: VENOUS This was a  suboptimal exam due to patient's body habitus and pain. Patient would not allow evaluation of the popliteal veins and calf veins due to pain. Extensive occlusive thrombus noted throughout both the femoral venous systems. No other focal abnormality identified. OTHER None. IMPRESSION: This is a suboptimal exam due to patient's body habitus and pain. Patient would not allow evaluation of the popliteal veins and calf veins due to pain. Extensive occlusive thrombus noted throughout both femoral venous systems. Critical Value/emergent results were called by telephone at the time of interpretation on 01/02/2021 at 8:17 am to provider Dr. Joni Fears , who verbally acknowledged these results. Electronically Signed   By: Marcello Moores  Register   On: 01/02/2021 08:20      Assessment/Plan 1.  Bilateral lower extremity DVTs.  I suspect these are occlusive and continues all the way up to the IVC filter bilaterally.  Her legs are quite swollen and she does have a lot of pain.  Thrombectomy is a reasonable option at least to clear out the iliac and IVC if possible.  I discussed the risks and benefits of the procedure and the patient is agreeable to proceed. 2.  Inoperable uterine cancer.  This clearly is making her hypercoagulable as well as creating pelvic compression.  This is a major factor in her thrombotic process. 3. Diabetes. Stable on outpatient medications and blood glucose control important in reducing the progression of atherosclerotic disease. Also, involved in wound healing. On appropriate medications. 4.  Hypertension. Stable on outpatient  medications and blood pressure control important in reducing the progression of atherosclerotic disease. On appropriate oral medications.    Leotis Pain, MD  01/02/2021 4:22 PM    This note was created with Dragon medical transcription system.  Any error is purely unintentional

## 2021-01-02 NOTE — ED Triage Notes (Signed)
Pt arrives from home via [facility], per report, Over the last month, ble edema. Denies hx of the same. February had DVTs removed. 118/70, hr 90, 97% room air.

## 2021-01-03 ENCOUNTER — Encounter
Admission: EM | Disposition: A | Discharge status: Home Health | Payer: Self-pay | Source: Home / Self Care | Attending: Internal Medicine

## 2021-01-03 DIAGNOSIS — I82413 Acute embolism and thrombosis of femoral vein, bilateral: Secondary | ICD-10-CM

## 2021-01-03 DIAGNOSIS — I82423 Acute embolism and thrombosis of iliac vein, bilateral: Secondary | ICD-10-CM

## 2021-01-03 HISTORY — PX: PERIPHERAL VASCULAR THROMBECTOMY: CATH118306

## 2021-01-03 LAB — CBC
HCT: 30.2 % — ABNORMAL LOW (ref 36.0–46.0)
Hemoglobin: 9.6 g/dL — ABNORMAL LOW (ref 12.0–15.0)
MCH: 29.1 pg (ref 26.0–34.0)
MCHC: 31.8 g/dL (ref 30.0–36.0)
MCV: 91.5 fL (ref 80.0–100.0)
Platelets: 258 10*3/uL (ref 150–400)
RBC: 3.3 MIL/uL — ABNORMAL LOW (ref 3.87–5.11)
RDW: 17.5 % — ABNORMAL HIGH (ref 11.5–15.5)
WBC: 4.5 10*3/uL (ref 4.0–10.5)
nRBC: 0 % (ref 0.0–0.2)

## 2021-01-03 LAB — BASIC METABOLIC PANEL
Anion gap: 3 — ABNORMAL LOW (ref 5–15)
BUN: 12 mg/dL (ref 8–23)
CO2: 26 mmol/L (ref 22–32)
Calcium: 8.3 mg/dL — ABNORMAL LOW (ref 8.9–10.3)
Chloride: 107 mmol/L (ref 98–111)
Creatinine, Ser: 0.85 mg/dL (ref 0.44–1.00)
GFR, Estimated: >60 mL/min (ref >60)
Glucose, Bld: 110 mg/dL — ABNORMAL HIGH (ref 70–99)
Potassium: 4.3 mmol/L (ref 3.5–5.1)
Sodium: 136 mmol/L (ref 135–145)

## 2021-01-03 LAB — GLUCOSE, CAPILLARY
Glucose-Capillary: 93 mg/dL (ref 70–99)
Glucose-Capillary: 92 mg/dL (ref 70–99)
Glucose-Capillary: 90 mg/dL (ref 70–99)
Glucose-Capillary: 105 mg/dL — ABNORMAL HIGH (ref 70–99)
Glucose-Capillary: 145 mg/dL — ABNORMAL HIGH (ref 70–99)

## 2021-01-03 LAB — HEPARIN LEVEL (UNFRACTIONATED)
Heparin Unfractionated: 0.17 IU/mL — ABNORMAL LOW (ref 0.30–0.70)
Heparin Unfractionated: 0.24 IU/mL — ABNORMAL LOW (ref 0.30–0.70)

## 2021-01-03 LAB — HEMOGLOBIN A1C
Hgb A1c MFr Bld: 6.2 % — ABNORMAL HIGH (ref 4.8–5.6)
Mean Plasma Glucose: 131 mg/dL

## 2021-01-03 SURGERY — PERIPHERAL VASCULAR THROMBECTOMY
Anesthesia: Moderate Sedation | Laterality: Bilateral

## 2021-01-03 MED ORDER — FENTANYL CITRATE (PF) 100 MCG/2ML IJ SOLN
INTRAMUSCULAR | Status: DC | PRN
  Administered 2021-01-03: 25 ug via INTRAVENOUS
  Administered 2021-01-03: 50 ug via INTRAVENOUS
  Administered 2021-01-03: 25 ug via INTRAVENOUS

## 2021-01-03 MED ORDER — HEPARIN BOLUS VIA INFUSION
2000.0000 [IU] | Freq: Once | INTRAVENOUS | Status: AC
  Administered 2021-01-03: 2000 [IU] via INTRAVENOUS
  Filled 2021-01-03: qty 2000

## 2021-01-03 MED ORDER — HEPARIN SODIUM (PORCINE) 1000 UNIT/ML IJ SOLN
INTRAMUSCULAR | Status: DC | PRN
  Administered 2021-01-03: 3000 [IU] via INTRAVENOUS

## 2021-01-03 MED ORDER — CEFAZOLIN SODIUM-DEXTROSE 2-4 GM/100ML-% IV SOLN
INTRAVENOUS | Status: AC
  Filled 2021-01-03: qty 100

## 2021-01-03 MED ORDER — MIDAZOLAM HCL 2 MG/2ML IJ SOLN
INTRAMUSCULAR | Status: DC | PRN
Start: 2021-01-03 — End: 2021-01-03
  Administered 2021-01-03: 1 mg via INTRAVENOUS
  Administered 2021-01-03: 2 mg via INTRAVENOUS
  Administered 2021-01-03: 1 mg via INTRAVENOUS

## 2021-01-03 MED ORDER — MIDAZOLAM HCL 5 MG/5ML IJ SOLN
INTRAMUSCULAR | Status: AC
  Filled 2021-01-03: qty 5

## 2021-01-03 MED ORDER — HEPARIN SODIUM (PORCINE) 1000 UNIT/ML IJ SOLN
INTRAMUSCULAR | Status: AC
  Filled 2021-01-03: qty 1

## 2021-01-03 MED ORDER — FENTANYL CITRATE (PF) 100 MCG/2ML IJ SOLN
INTRAMUSCULAR | Status: AC
  Filled 2021-01-03: qty 2

## 2021-01-03 MED ORDER — IODIXANOL 320 MG/ML IV SOLN
INTRAVENOUS | Status: DC | PRN
  Administered 2021-01-03: 50 mL via INTRA_ARTERIAL

## 2021-01-03 SURGICAL SUPPLY — 10 items
BALLN DORADO 10X80X80 (BALLOONS) ×4
BALLOON DORADO 10X80X80 (BALLOONS) IMPLANT
CATH INDIGO 12XTORQ 100 (CATHETERS) ×2 IMPLANT
COVER PROBE U/S 5X48 (MISCELLANEOUS) ×1 IMPLANT
DEVICE SAFEGUARD 24CM (GAUZE/BANDAGES/DRESSINGS) ×2 IMPLANT
KIT ENCORE 26 ADVANTAGE (KITS) ×2 IMPLANT
PACK ANGIOGRAPHY (CUSTOM PROCEDURE TRAY) ×2 IMPLANT
SHEATH PINNACLE 11FRX10 (SHEATH) ×2 IMPLANT
WIRE GUIDERIGHT .035X150 (WIRE) ×4 IMPLANT
WIRE MAGIC TOR.035 180C (WIRE) ×2 IMPLANT

## 2021-01-03 NOTE — Progress Notes (Signed)
PROGRESS NOTE  [name]  DOB: 03/31/1956  PCP: [facility] [place] DGU:[id number]  DOA: 01/02/2021  LOS: 1 day  Hospital Day: 2  Chief Complaint  Patient presents with   Leg Swelling    Brief narrative: Courtney Mack is a 65 y.o. female with PMH significant for DM2, history of DVT/PE (on February 2022 sp embolectomy and IVC filter placement) on chronic anticoagulation, diagnosed with uterine cancer 1 month later, treated with radiation and brachytherapy, currently cancer free.  Also with history of morbid obesity, chronic lower extremity lymphedema, history of endometrial cancer. Patient presented to the ED on 6/28 for evaluation of bilateral lower extremity pain and swelling for a month, progressively worsening mostly with ambulation.  Also associated with exertional shortness of breath.  Reports compliance to anticoagulation.  In the ED, patient was hemodynamically stable, labs mostly unremarkable. Lower extremity venous Doppler shows extensive occlusive thrombus noted throughout both femoral venous systems. CT angiogram of the chest shows small volume of chronic and nonocclusive PE remains in some of the right lower lobe branches, with clearance of the saddle embolus seen in February, and no convincing acute pulmonary embolus on this evaluation. Patient was started on heparin drip.  Vascular surgery was consulted. Currently admitted under hospitalist service with a plan to thrombectomy today.  Subjective: Patient was seen and examined this morning.  Pleasant elderly Caucasian female.  Lying in bed.  On low-flow oxygen.  No chest pain.  Hemodynamically stable.  Remains n.p.o. for the procedure today  Assessment/Plan: Recurrent bilateral DVT Residual pulmonary embolism -History of DVT/PE in February 2022 for which she received embolectomy, IVC filter and has been compliant to anticoagulation since. -However presented with worsening bilateral lower  extremity swelling and pain with imaging showing extensive DVT. -Vascular surgery consulted.  Possible plan of thrombectomy today. -Currently on heparin drip.  Xarelto on hold.  Type 2 diabetes mellitus -A1c 6.2 on 6/28. -Home meds include metformin which is currently on hold. -Currently on sliding scale insulin -Blood sugar level controlled. Recent Labs  Lab 01/02/21 1553 01/03/21 0832  GLUCAP 82 93    Morbid obesity  -Body mass index is 46.4 kg/m. Patient has been advised to make an attempt to improve diet and exercise patterns to aid in weight loss.  Hypertension Continue losartan  History of endometrial carcinoma -Status post radiation and brachytherapy -Follow-up with oncology as an outpatient at [facility]  Mobility: Encourage ambulation after procedure Code Status:   Code Status: Full Code  Nutritional status: Body mass index is 46.4 kg/m.     Diet Order             Diet NPO time specified  Diet effective midnight                   DVT prophylaxis:   Currently on heparin drip   Antimicrobials: None Fluid: None Consultants: Vascular surgery Family Communication: Family not at bedside  Status is: Inpatient  Remains inpatient appropriate because: Pending thrombectomy today  Dispo: The patient is from: Home              Anticipated d/c is to: Hopefully home              Patient currently is not medically stable to d/c.   Difficult to place patient No     Infusions:   sodium chloride     sodium chloride 20 mL/hr at 01/02/21 2013    ceFAZolin (ANCEF) IV  heparin 1,500 Units/hr (01/03/21 0334)    Scheduled Meds:  insulin aspart  0-20 Units Subcutaneous TID WC   losartan  25 mg Oral Daily   mometasone-formoterol  2 puff Inhalation BID   rosuvastatin  10 mg Oral Daily   sodium chloride flush  3 mL Intravenous Q12H    Antimicrobials: Anti-infectives (From admission, onward)    Start     Dose/Rate Route Frequency Ordered Stop   01/02/21 1918   ceFAZolin (ANCEF) IVPB 2g/100 mL premix        2 g 200 mL/hr over 30 Minutes Intravenous 30 min pre-op 01/02/21 1918         PRN meds: sodium chloride, acetaminophen **OR** acetaminophen, morphine injection, ondansetron **OR** ondansetron (ZOFRAN) IV, sodium chloride flush   Objective: Vitals:   01/03/21 0342 01/03/21 0835  BP: (!) 121/57 (!) 153/68  Pulse: 84 81  Resp: (!) 22 19  Temp: 98 F (36.7 C) 98.2 F (36.8 C)  SpO2: 97% 95%    Intake/Output Summary (Last 24 hours) at 01/03/2021 0900 Last data filed at 01/03/2021 0400 Gross per 24 hour  Intake 765.91 ml  Output 350 ml  Net 415.91 ml   Filed Weights   01/02/21 1019  Weight: 100.7 kg   Weight change:  Body mass index is 46.4 kg/m.   Physical Exam: General exam: Pleasant, elderly Caucasian female, morbidly obese.  Not in physical distress Skin: No rashes, lesions or ulcers. HEENT: Atraumatic, normocephalic, no obvious bleeding Lungs: Clear to auscultation bilaterally CVS: Regular rate and rhythm, no murmur GI/Abd soft, nontender, nondistended, bowel sound present CNS: Alert, awake, oriented x3 Psychiatry: Mood appropriate Extremities: Chronic bilateral lower extremity lymphedema  Data Review: I have personally reviewed the laboratory data and studies available.  Recent Labs  Lab 01/02/21 0534 01/03/21 0202  WBC 6.7 4.5  HGB 10.6* 9.6*  HCT 32.6* 30.2*  MCV 90.3 91.5  PLT 306 258   Recent Labs  Lab 01/02/21 0534 01/03/21 0202  NA 136 136  K 3.9 4.3  CL 103 107  CO2 23 26  GLUCOSE 140* 110*  BUN 13 12  CREATININE 0.98 0.85  CALCIUM 8.6* 8.3*    F/u labs ordered Unresulted Labs (From admission, onward)     Start     Ordered   01/03/21 0900  Heparin level (unfractionated)  Once-Timed,   TIMED        01/03/21 0240   Unscheduled  CBC with Differential/Platelet  Daily,   R      01/03/21 0900   Unscheduled  Basic metabolic panel  Daily,   R      01/03/21 0900             Signed, Terrilee Croak, MD [facility] 01/03/2021

## 2021-01-03 NOTE — Interval H&P Note (Signed)
History and Physical Interval Note:  01/03/2021 2:03 PM  Courtney Mack  has presented today for surgery, with the diagnosis of DVT.  The various methods of treatment have been discussed with the patient and family. After consideration of risks, benefits and other options for treatment, the patient has consented to  Procedure(s): PERIPHERAL VASCULAR THROMBECTOMY (Bilateral) as a surgical intervention.  The patient's history has been reviewed, patient examined, no change in status, stable for surgery.  I have reviewed the patient's chart and labs.  Questions were answered to the patient's satisfaction.     Leotis Pain

## 2021-01-03 NOTE — Consult Note (Signed)
Courtney Mack for heparin infusion Indication: DVT and PE  Patient Measurements: Heparin Dosing Weight: 66 kg  Labs: Recent Labs    01/02/21 0534 01/02/21 1051 01/02/21 1051 01/02/21 1720 01/03/21 0202 01/03/21 0854  HGB 10.6*  --   --   --  9.6*  --   HCT 32.6*  --   --   --  30.2*  --   PLT 306  --   --   --  258  --   APTT  --  26  --   --   --   --   LABPROT  --  14.9  --   --   --   --   INR  --  1.2  --   --   --   --   HEPARINUNFRC  --  <0.10*   < > <0.10* 0.17* 0.24*  CREATININE 0.98  --   --   --  0.85  --   TROPONINIHS 4  --   --   --   --   --    < > = values in this interval not displayed.    Estimated Creatinine Clearance: 66.6 mL/min (by C-G formula based on SCr of 0.85 mg/dL).   Medical History: Past Medical History:  Diagnosis Date   Diabetes mellitus without complication ([place])    DVT (deep venous thrombosis) ([place]) 09/06/2020   High cholesterol    Hx of blood clots    Hypertension    IDA (iron deficiency anemia) 09/21/2020    Assessment: Courtney Mack is a 65 YO female who presented to the ED on 01/02/21 with leg swelling and SOB. Doppler found thrombus throughout both femoral vein systems. Courtney Mack was on Xarelto PTA and had a dose last night 6/27. Vascular is following and planning to perform thrombectomy.  Baseline aPTT 26s, INR 1.2, HL <0.10. Baseline CBC notable for mild anemia  6/28 1058 IV heparin 2000 unit bolus followed by continuous infusion at 1050 units/hr 6/28 1720 HL < 0.10 on 1050 units/hr 6/29 0202 HL 0.17, subtherapeutic on 1300 units/hr 6/29 0854 HL 0.24, subtherapeutic on 1500 units/hr  Goal of Therapy:  Heparin level 0.3-0.7 units/ml Monitor platelets by anticoagulation protocol: Yes   Plan:  Patient had thrombectomy 6/19 and now resuming Heparin drip post procedure -Resume Heparin infusion rate of 1650 units/hr -check HL 6 hours  --Daily CBC per protocol while on IV heparin  Noralee Space,  PharmD Clinical Pharmacist 01/03/2021 5:00 PM

## 2021-01-03 NOTE — Consult Note (Signed)
[facility] for heparin infusion Indication: DVT and PE  Patient Measurements: Heparin Dosing Weight: 66 kg  Labs: Recent Labs    01/02/21 0534 01/02/21 1051 01/02/21 1720 01/03/21 0202  HGB 10.6*  --   --  9.6*  HCT 32.6*  --   --  30.2*  PLT 306  --   --  258  APTT  --  26  --   --   LABPROT  --  14.9  --   --   INR  --  1.2  --   --   HEPARINUNFRC  --  <0.10* <0.10* 0.17*  CREATININE 0.98  --   --  0.85  TROPONINIHS 4  --   --   --     Estimated Creatinine Clearance: 66.6 mL/min (by C-G formula based on SCr of 0.85 mg/dL).   Medical History: Past Medical History:  Diagnosis Date   Diabetes mellitus without complication ([place])    DVT (deep venous thrombosis) ([place]) 09/06/2020   High cholesterol    Hx of blood clots    Hypertension    IDA (iron deficiency anemia) 09/21/2020    Assessment: KM is a 65 YO female who presented to the ED on 01/02/21 with leg swelling and SOB. Doppler found thrombus throughout both femoral vein systems. KM was on Xarelto PTA and had a dose last night. Vascular is following and planning to perform thrombectomy.  Baseline aPTT 26s, INR 1.2, HL <0.10. Baseline CBC notable for mild anemia  6/28 1058 IV heparin 2000 unit bolus followed by continuous infusion at 1050 units/hr 6/28 1720 HL < 0.10 on 1050 units/hr 6/29 0202 HL 0.17, subtherapeutic  Goal of Therapy:  Heparin level 0.3-0.7 units/ml Monitor platelets by anticoagulation protocol: Yes   Plan:  --Heparin level is subtherapeutic --Heparin 2000 unit IV bolus and increase infusion rate to 1500 units/hr --Re-check HL 6 hours after rate change --Daily CBC per protocol while on IV heparin  Renda Rolls, PharmD, [facility] 01/03/2021 2:39 AM

## 2021-01-03 NOTE — Progress Notes (Signed)
Patient transported to specials for bilateral LE thrombectomy.

## 2021-01-03 NOTE — Op Note (Signed)
[facility]   OPERATIVE NOTE   PRE-OPERATIVE DIAGNOSIS: extensive BLE DVT with previous IVC filter placed  POST-OPERATIVE DIAGNOSIS: same   PROCEDURE: 1.   US guidance for vascular access to bilateral femoral vein 2.   Catheter placement into the vena cava from bilateral femoral approach 3.   IVC gram and bilateral iliofemoral venogram 4.    Mechanical thrombectomy to right common femoral, external and common iliac veins, and IVC with the penumbra CAT 12 device 5.   Mechanical thrombectomy to the left common femoral, external and common iliac veins, and IVC with the penumbra CAT 12 device 6.   PTA of left common iliac vein with 10 mm balloon 7.   PTA of the right common iliac vein with 10 mm balloon    SURGEON: Leotis Pain, MD  ASSISTANT(S): none  ANESTHESIA: local with moderate conscious sedation for 42 minutes using 4 mg of Versed and 100 mcg of Fentanyl  ESTIMATED BLOOD LOSS: 400 cc  FINDING(S): 1.  Extensive thrombosis throughout both lower extremities and iliac veins all the way up to a thrombosed IVC filter  SPECIMEN(S):  none  INDICATIONS:    Patient is a 65 y.o. female who presents with extensive bilateral lower extremity DVT.  Patient has marked leg swelling and pain.  Venous intervention is performed to reduce the symtpoms and avoid long term postphlebitic symptoms.    DESCRIPTION: After obtaining full informed written consent, the patient was brought back to the vascular suite and placed supine upon the table. Moderate conscious sedation was administered during a face to face encounter with the patient throughout the procedure with my supervision of the RN administering medicines and monitoring the patient's vital signs, pulse oximetry, telemetry and mental status throughout from the start of the procedure until the patient was taken to the recovery room.  After obtaining adequate anesthesia, the patient was prepped and draped in the standard  fashion.    The patient was then placed into the supine position.  The left femoral vein was then accessed under direct ultrasound guidance without difficulty with a micropuncture needle and a permanent image was recorded.  I then upsized to an 11Fr sheath over a J wire.  Similarly, the right femoral vein was accessed under direct ultrasound guidance without difficulty with a Seldinger needle and a permanent image was recorded.  We then upsized to 11 French sheath on the right side as well.  These were accessed below the saphenofemoral junction to try to allow some treatment of the common femoral vein as well.  3000 units of heparin were then given.  Imaging showed extensive DVT with minimal flow.  We advanced the catheters into the vena cava which showed this to be thrombosed to the level of the IVC filter.  We then exchanged over the J-wire for the penumbra CAT 12 device mechanical thrombectomy was performed first on the left side with extensive amount of clot removed in the left common femoral vein, external and common iliac vein, and IVC.  We then took the penumbra CAT 12 device to the right side and similarly performed extensive mechanical thrombectomy from the right common femoral vein, external and common iliac veins, and IVC.  2 additional passes were made on each side due to the extensive clot burden but and a tremendous amount of thrombus was removed.  There is still some narrowing and thrombus in both common iliac veins, so I elected to perform angioplasty on the sites.  Magic torque  wires were placed bilaterally and 10 mm diameter by 8 cm length balloons were taken up into the distal IVC and both common iliac veins and inflated simultaneously in a kissing balloon fashion up to 8 atm on each side.  Following treatment, there was good inline flow with less than 20% residual stenosis in the iliac veins and a small amount of residual thrombus in the iliac veins and IVC.  I then elected to terminate the  procedure.  The sheath was removed and a dressing was placed on each side.  She was taken to the recovery room in stable condition having tolerated the procedure well.    COMPLICATIONS: None  CONDITION: Stable  Leotis Pain 01/03/2021 4:18 PM

## 2021-01-03 NOTE — Consult Note (Signed)
[facility] for heparin infusion Indication: DVT and PE  Patient Measurements: Heparin Dosing Weight: 66 kg  Labs: Recent Labs    01/02/21 0534 01/02/21 1051 01/02/21 1051 01/02/21 1720 01/03/21 0202 01/03/21 0854  HGB 10.6*  --   --   --  9.6*  --   HCT 32.6*  --   --   --  30.2*  --   PLT 306  --   --   --  258  --   APTT  --  26  --   --   --   --   LABPROT  --  14.9  --   --   --   --   INR  --  1.2  --   --   --   --   HEPARINUNFRC  --  <0.10*   < > <0.10* 0.17* 0.24*  CREATININE 0.98  --   --   --  0.85  --   TROPONINIHS 4  --   --   --   --   --    < > = values in this interval not displayed.    Estimated Creatinine Clearance: 66.6 mL/min (by C-G formula based on SCr of 0.85 mg/dL).   Medical History: Past Medical History:  Diagnosis Date   Diabetes mellitus without complication ([place])    DVT (deep venous thrombosis) ([place]) 09/06/2020   High cholesterol    Hx of blood clots    Hypertension    IDA (iron deficiency anemia) 09/21/2020    Assessment: Courtney Mack is a 65 YO female who presented to the ED on 01/02/21 with leg swelling and SOB. Doppler found thrombus throughout both femoral vein systems. Courtney Mack was on Xarelto PTA and had a dose last night. Vascular is following and planning to perform thrombectomy.  Baseline aPTT 26s, INR 1.2, HL <0.10. Baseline CBC notable for mild anemia  6/28 1058 IV heparin 2000 unit bolus followed by continuous infusion at 1050 units/hr 6/28 1720 HL < 0.10 on 1050 units/hr 6/29 0202 HL 0.17, subtherapeutic on 1300 units/hr 6/29 0854 HL 0.24, subtherapeutic on 1500 units/hr  Goal of Therapy:  Heparin level 0.3-0.7 units/ml Monitor platelets by anticoagulation protocol: Yes   Plan:  --Heparin level is subtherapeutic --Heparin 2000 unit IV bolus and increase infusion rate to 1650 units/hr --Re-check HL 6 hours after rate change --Daily CBC per protocol while on IV heparin  Lu Duffel, PharmD,  BCPS Clinical Pharmacist 01/03/2021 10:13 AM

## 2021-01-04 ENCOUNTER — Encounter: Payer: Self-pay | Admitting: Vascular Surgery

## 2021-01-04 LAB — CBC WITH DIFFERENTIAL/PLATELET
Abs Immature Granulocytes: 0.03 10*3/uL (ref 0.00–0.07)
Basophils Absolute: 0 10*3/uL (ref 0.0–0.1)
Basophils Relative: 1 %
Eosinophils Absolute: 0.1 10*3/uL (ref 0.0–0.5)
Eosinophils Relative: 2 %
HCT: 29 % — ABNORMAL LOW (ref 36.0–46.0)
Hemoglobin: 9.4 g/dL — ABNORMAL LOW (ref 12.0–15.0)
Immature Granulocytes: 1 %
Lymphocytes Relative: 11 %
Lymphs Abs: 0.6 10*3/uL — ABNORMAL LOW (ref 0.7–4.0)
MCH: 29.4 pg (ref 26.0–34.0)
MCHC: 32.4 g/dL (ref 30.0–36.0)
MCV: 90.6 fL (ref 80.0–100.0)
Monocytes Absolute: 0.4 10*3/uL (ref 0.1–1.0)
Monocytes Relative: 8 %
Neutro Abs: 4.1 10*3/uL (ref 1.7–7.7)
Neutrophils Relative %: 77 %
Platelets: 274 10*3/uL (ref 150–400)
RBC: 3.2 MIL/uL — ABNORMAL LOW (ref 3.87–5.11)
RDW: 17.3 % — ABNORMAL HIGH (ref 11.5–15.5)
WBC: 5.2 10*3/uL (ref 4.0–10.5)
nRBC: 0 % (ref 0.0–0.2)

## 2021-01-04 LAB — GLUCOSE, CAPILLARY
Glucose-Capillary: 139 mg/dL — ABNORMAL HIGH (ref 70–99)
Glucose-Capillary: 154 mg/dL — ABNORMAL HIGH (ref 70–99)
Glucose-Capillary: 154 mg/dL — ABNORMAL HIGH (ref 70–99)
Glucose-Capillary: 119 mg/dL — ABNORMAL HIGH (ref 70–99)

## 2021-01-04 LAB — BASIC METABOLIC PANEL
Anion gap: 6 (ref 5–15)
BUN: 11 mg/dL (ref 8–23)
CO2: 26 mmol/L (ref 22–32)
Calcium: 8.1 mg/dL — ABNORMAL LOW (ref 8.9–10.3)
Chloride: 104 mmol/L (ref 98–111)
Creatinine, Ser: 1.02 mg/dL — ABNORMAL HIGH (ref 0.44–1.00)
GFR, Estimated: >60 mL/min (ref >60)
Glucose, Bld: 136 mg/dL — ABNORMAL HIGH (ref 70–99)
Potassium: 4.6 mmol/L (ref 3.5–5.1)
Sodium: 136 mmol/L (ref 135–145)

## 2021-01-04 LAB — HEPARIN LEVEL (UNFRACTIONATED)
Heparin Unfractionated: 0.24 IU/mL — ABNORMAL LOW (ref 0.30–0.70)
Heparin Unfractionated: 0.25 IU/mL — ABNORMAL LOW (ref 0.30–0.70)
Heparin Unfractionated: 0.28 IU/mL — ABNORMAL LOW (ref 0.30–0.70)
Heparin Unfractionated: 0.5 IU/mL (ref 0.30–0.70)

## 2021-01-04 MED ORDER — HEPARIN BOLUS VIA INFUSION
2000.0000 [IU] | Freq: Once | INTRAVENOUS | Status: AC
  Administered 2021-01-04: 09:00:00 2000 [IU] via INTRAVENOUS
  Filled 2021-01-04: qty 2000

## 2021-01-04 MED ORDER — HEPARIN BOLUS VIA INFUSION
2000.0000 [IU] | Freq: Once | INTRAVENOUS | Status: AC
  Administered 2021-01-04: 2000 [IU] via INTRAVENOUS
  Filled 2021-01-04: qty 2000

## 2021-01-04 MED ORDER — HEPARIN BOLUS VIA INFUSION
1000.0000 [IU] | Freq: Once | INTRAVENOUS | Status: AC
  Administered 2021-01-04: 1000 [IU] via INTRAVENOUS
  Filled 2021-01-04: qty 1000

## 2021-01-04 NOTE — Evaluation (Signed)
Occupational Therapy Evaluation Patient Details Name: Courtney Mack MRN: [id number] DOB: 03-22-1956 Today's Date: 01/04/2021    History of Present Illness Courtney Mack is a 65 y.o. female with PMH significant for DM2, history of DVT/PE (on February 2022 sp embolectomy and IVC filter placement) on chronic anticoagulation, diagnosed with uterine cancer 1 month later, treated with radiation and brachytherapy, currently cancer free.  Also with history of morbid obesity, chronic lower extremity lymphedema, history of endometrial cancer. Patient presented to the ED on 6/28 for evaluation of bilateral lower extremity pain and swelling for a month c shortness of breath. S/p mechanical thrombectomy to right and left common femoral, external and common iliac veins   Clinical Impression   Ms Courtney Mack was seen for OT evaluation this date. Prior to hospital admission, pt was MOD I for mobility using 4WW, PRN assist for LB ADLs. Pt lives with 2 daughters, reports 1 is always available. Pt presents to acute OT demonstrating impaired ADL performance and functional mobility 2/2 decreased LB access and functional strength/balance/endurance deficits. Pt currently requires MAX A don B socks seated EOB and MOD A don briefs, pt assists with standing portion. CGA + RW sit<>stand, SBA + single UE support  standing grooming tasks. Pt noted to be weeping blood on lower back (pt reports scratching), RN in room to dress.   During ~15 ft mobility max HR153 bpm, RN aware. Pt would benefit from skilled OT to address noted impairments and functional limitations (see below for any additional details) in order to maximize safety and independence while minimizing falls risk and caregiver burden. Upon hospital discharge, recommend HHOT to maximize pt safety and return to functional independence during meaningful occupations of daily life.     Follow Up Recommendations  Home health OT    Equipment Recommendations  None  recommended by OT    Recommendations for Other Services       Precautions / Restrictions Precautions Precautions: Fall Restrictions Weight Bearing Restrictions: No      Mobility Bed Mobility Overal bed mobility: Needs Assistance Bed Mobility: Supine to Sit;Sit to Supine     Supine to sit: Mod assist;HOB elevated Sit to supine: Mod assist;HOB elevated        Transfers Overall transfer level: Needs assistance Equipment used: Rolling walker (2 wheeled) Transfers: Sit to/from Stand Sit to Stand: Min guard;From elevated surface              Balance Overall balance assessment: Needs assistance Sitting-balance support: No upper extremity supported;Feet supported Sitting balance-Leahy Scale: Good     Standing balance support: Single extremity supported;During functional activity Standing balance-Leahy Scale: Fair                             ADL either performed or assessed with clinical judgement   ADL Overall ADL's : Needs assistance/impaired                                       General ADL Comments: MAX A don B socks seated EOB and MOD A don briefs, pt assists with standing portion. CGA + RW for ADL t/f      Pertinent Vitals/Pain Pain Assessment: No/denies pain     Hand Dominance Right   Extremity/Trunk Assessment Upper Extremity Assessment Upper Extremity Assessment: Generalized weakness   Lower Extremity Assessment Lower Extremity Assessment: Generalized weakness (  BLE edema)       Communication Communication Communication: No difficulties   Cognition Arousal/Alertness: Awake/alert Behavior During Therapy: WFL for tasks assessed/performed Overall Cognitive Status: Within Functional Limits for tasks assessed                                     General Comments  highest HR during mobility 153 - RN in room throughout    Exercises Exercises: Other exercises Other Exercises Other Exercises: OT  educated re: OT role, DME recs, d/c recs, falls prevention, ECS, home/routines modifications Other Exercises: LBD, sup<>sit, sit<>stand, sitting/standing balance/tolerance, !15 ft mobility   Shoulder Instructions      Home Living Family/patient expects to be discharged to:: Private residence Living Arrangements: Children Available Help at Discharge: Family;Available PRN/intermittently;Available 24 hours/day Type of Home: House Home Access: Ramped entrance     Home Layout: One level     Bathroom Shower/Tub: Occupational psychologist: Handicapped height     Home Equipment: Environmental consultant - 4 wheels;Cane - single point;Bedside commode   Additional Comments: has lift recliner      Prior Functioning/Environment Level of Independence: Needs assistance  Gait / Transfers Assistance Needed: MOD I with rollator household distances, difficulty with bed mobility, prefers lift recliner. w/c for community mobility ADL's / Homemaking Assistance Needed: Assist for LBD as needed from daughters            OT Problem List: Decreased strength;Decreased range of motion;Decreased activity tolerance;Decreased safety awareness;Decreased knowledge of use of DME or AE      OT Treatment/Interventions: Self-care/ADL training;Therapeutic exercise;Energy conservation;DME and/or AE instruction;Therapeutic activities;Patient/family education;Balance training    OT Goals(Current goals can be found in the care plan section) Acute Rehab OT Goals Patient Stated Goal: to go home OT Goal Formulation: With patient Time For Goal Achievement: 01/18/21 Potential to Achieve Goals: Good ADL Goals Pt Will Perform Grooming: with modified independence;standing (c LRAD PRN) Pt Will Perform Lower Body Dressing: with min assist;sitting/lateral leans Pt Will Transfer to Toilet: with set-up;with supervision (c LRAD PRN)  OT Frequency: Min 1X/week    AM-PAC OT "6 Clicks" Daily Activity     Outcome Measure Help from  another person eating meals?: None Help from another person taking care of personal grooming?: A Little Help from another person toileting, which includes using toliet, bedpan, or urinal?: A Little Help from another person bathing (including washing, rinsing, drying)?: A Little Help from another person to put on and taking off regular upper body clothing?: A Little Help from another person to put on and taking off regular lower body clothing?: A Lot 6 Click Score: 18   End of Session Equipment Utilized During Treatment: Rolling walker Nurse Communication: Mobility status  Activity Tolerance: Patient tolerated treatment well Patient left: in bed;with call bell/phone within reach;with bed alarm set  OT Visit Diagnosis: Other abnormalities of gait and mobility (R26.89)                Time: 1510-1536 OT Time Calculation (min): 26 min Charges:  OT General Charges $OT Visit: 1 Visit OT Evaluation $OT Eval Low Complexity: 1 Low OT Treatments $Self Care/Home Management : 8-22 mins Dessie Coma, M.S. OTR/L  01/04/21, 4:20 PM  ascom [phone]

## 2021-01-04 NOTE — Progress Notes (Signed)
[facility] Daily Progress Note  01/03/21: 1.   US guidance for vascular access to bilateral femoral vein 2.   Catheter placement into the vena cava from bilateral femoral approach 3.   IVC gram and bilateral iliofemoral venogram 4.    Mechanical thrombectomy to right common femoral, external and common iliac veins, and IVC with the penumbra CAT 12 device 5.   Mechanical thrombectomy to the left common femoral, external and common iliac veins, and IVC with the penumbra CAT 12 device 6.   PTA of left common iliac vein with 10 mm balloon 7.   PTA of the right common iliac vein with 10 mm balloon  Subjective: Patient without complain this AM. States her lower extremity discomfort "has gone away." No issues overnight.   Objective: Vitals:   01/03/21 2043 01/04/21 0009 01/04/21 0552 01/04/21 0847  BP: (!) 106/58 (!) 110/55 107/62 138/71  Pulse: 99 84 92 95  Resp: 20  19 16   Temp: 98.3 F (36.8 C) 98.7 F (37.1 C) 98.1 F (36.7 C) 97.8 F (36.6 C)  TempSrc:    Oral  SpO2: 96% 97% 96% 96%  Weight:      Height:        Intake/Output Summary (Last 24 hours) at 01/04/2021 1033 Last data filed at 01/04/2021 1016 Gross per 24 hour  Intake 360 ml  Output 400 ml  Net -40 ml   Physical Exam: A&Ox3, NAD CV: RRR Pulmonary: CTA Bilaterally Abdomen: Soft, Nontender, Nondistended Groin:   PADx2. No swelling or drainage noted Vascular:  Right lower extremity: thigh soft, calf soft. Hard to palpate pedal pulses due to body habitus. Motor / sensory is intact. No acute compromise noted on exam.   Left lower extremity: thigh sofrt calf soft. Hard to palpate pedal pulses due to body habitus. Motor / sensory is intact. No acute compromise noted on exam.    Laboratory: CBC    Component Value Date/Time   WBC 5.2 01/04/2021 0021   HGB 9.4 (L) 01/04/2021 0021   HCT 29.0 (L) 01/04/2021 0021   PLT 274 01/04/2021 0021   BMET    Component Value Date/Time   NA 136  01/04/2021 0021   K 4.6 01/04/2021 0021   CL 104 01/04/2021 0021   CO2 26 01/04/2021 0021   GLUCOSE 136 (H) 01/04/2021 0021   BUN 11 01/04/2021 0021   CREATININE 1.02 (H) 01/04/2021 0021   CALCIUM 8.1 (L) 01/04/2021 0021   GFRNONAA >60 01/04/2021 0021   Assessment/Planning: The patient is 65 year old female with a hx of DVT / PE who presented with extensive bilateral lower extremity DVT s/p thrombectomy / thrombolysis - POD#1  1) OK to remove PAD's 2) PT / OT / Ambulation 3) OK from vascular standpoint to transition to PO anticoagulation 4) Will continue to follow the patient in our office  Discussed with Dr. Ellis Parents Chandan Fly PA-C 01/04/2021 10:33 AM

## 2021-01-04 NOTE — Progress Notes (Signed)
PT Cancellation Note  Patient Details Name: Courtney Mack MRN: [id number] DOB: 08/06/1955   Cancelled Treatment:    Reason Eval/Treat Not Completed: Other (comment).  PT consult received.  Chart reviewed.  Pt resting in bed upon PT arrival and just starting to eat her dinner.  Pt reporting recently getting up with occupational therapy and requesting to eat her dinner at this time.  Will re-attempt PT evaluation at a later date/time.  Leitha Bleak, PT 01/04/21, 4:22 PM

## 2021-01-04 NOTE — Consult Note (Signed)
[place] for heparin infusion Indication: DVT and PE  Patient Measurements: Heparin Dosing Weight: 66 kg  Labs: Recent Labs    01/02/21 0534 01/02/21 1051 01/02/21 1720 01/03/21 0202 01/03/21 0854 01/04/21 0021 01/04/21 0653 01/04/21 1442  HGB 10.6*  --   --  9.6*  --  9.4*  --   --   HCT 32.6*  --   --  30.2*  --  29.0*  --   --   PLT 306  --   --  258  --  274  --   --   APTT  --  26  --   --   --   --   --   --   LABPROT  --  14.9  --   --   --   --   --   --   INR  --  1.2  --   --   --   --   --   --   HEPARINUNFRC  --  <0.10*   < > 0.17*   < > 0.28* 0.25* 0.24*  CREATININE 0.98  --   --  0.85  --  1.02*  --   --   TROPONINIHS 4  --   --   --   --   --   --   --    < > = values in this interval not displayed.    Estimated Creatinine Clearance: 55.5 mL/min (A) (by C-G formula based on SCr of 1.02 mg/dL (H)).   Medical History: Past Medical History:  Diagnosis Date   Diabetes mellitus without complication ([place])    DVT (deep venous thrombosis) ([place]) 09/06/2020   High cholesterol    Hx of blood clots    Hypertension    IDA (iron deficiency anemia) 09/21/2020    Assessment: KM is a 65 YO female who presented to the ED on 01/02/21 with leg swelling and SOB. Doppler found thrombus throughout both femoral vein systems. KM was on Xarelto PTA and had a dose evening 6/27. Patient is POD 1 thrombectomy by vascular.  Baseline aPTT 26s, INR 1.2, HL <0.10. Baseline CBC notable for mild anemia  6/28 1058 IV heparin 2000 unit bolus followed by continuous infusion at 1050 units/hr 6/28 1720 HL < 0.10 on 1050 units/hr 6/29 0202 HL 0.17, subtherapeutic on 1300 units/hr 6/29 0854 HL 0.24, subtherapeutic on 1500 units/hr 6/30 0021 HL 0.28, subtherapeutic 6/30 0653 HL 0.25, subtherapeutic 6/30 1442 HL 0.24. subtherapeutic  Goal of Therapy:  Heparin level 0.3-0.7 units/ml Monitor platelets by anticoagulation protocol: Yes   Plan:  -Bolus  of 2000 units x 1 -Increase heparin infusion rate to 2200 units/hr -Check HL 6 hours after rate change  --Daily CBC per protocol while on IV heparin  Lu Duffel, PharmD, BCPS Clinical Pharmacist 01/04/2021 3:13 PM

## 2021-01-04 NOTE — Consult Note (Signed)
[place] for heparin infusion Indication: DVT and PE  Patient Measurements: Heparin Dosing Weight: 66 kg  Labs: Recent Labs    01/02/21 0534 01/02/21 1051 01/02/21 1720 01/03/21 0202 01/03/21 0854 01/04/21 0021  HGB 10.6*  --   --  9.6*  --  9.4*  HCT 32.6*  --   --  30.2*  --  29.0*  PLT 306  --   --  258  --  274  APTT  --  26  --   --   --   --   LABPROT  --  14.9  --   --   --   --   INR  --  1.2  --   --   --   --   HEPARINUNFRC  --  <0.10*   < > 0.17* 0.24* 0.28*  CREATININE 0.98  --   --  0.85  --  1.02*  TROPONINIHS 4  --   --   --   --   --    < > = values in this interval not displayed.    Estimated Creatinine Clearance: 55.5 mL/min (A) (by C-G formula based on SCr of 1.02 mg/dL (H)).   Medical History: Past Medical History:  Diagnosis Date   Diabetes mellitus without complication (Courtney Mack)    DVT (deep venous thrombosis) (Courtney Mack) 09/06/2020   High cholesterol    Hx of blood clots    Hypertension    IDA (iron deficiency anemia) 09/21/2020    Assessment: Courtney Mack is a 65 YO female who presented to the ED on 01/02/21 with leg swelling and SOB. Doppler found thrombus throughout both femoral vein systems. Courtney Mack was on Xarelto PTA and had a dose last night 6/27. Vascular is following and planning to perform thrombectomy.  Baseline aPTT 26s, INR 1.2, HL <0.10. Baseline CBC notable for mild anemia  6/28 1058 IV heparin 2000 unit bolus followed by continuous infusion at 1050 units/hr 6/28 1720 HL < 0.10 on 1050 units/hr 6/29 0202 HL 0.17, subtherapeutic on 1300 units/hr 6/29 0854 HL 0.24, subtherapeutic on 1500 units/hr 6/30 0021 HL 0.28, subtherapeutic  Goal of Therapy:  Heparin level 0.3-0.7 units/ml Monitor platelets by anticoagulation protocol: Yes   Plan:  -Bolus of 1000 units x 1 -Increase heparin infusion rate to 1800 units/hr -Check HL 6 hours after rate change  --Daily CBC per protocol while on IV heparin  Renda Rolls,  PharmD, [facility] 01/04/2021 12:58 AM

## 2021-01-04 NOTE — Progress Notes (Signed)
PROGRESS NOTE  Courtney Mack  DOB: 12/07/1955  PCP: [facility] [place] ZOX:[id number]  DOA: 01/02/2021  LOS: 2 days  Hospital Day: 3  Chief Complaint  Patient presents with   Leg Swelling    Brief narrative: Courtney Mack is a 65 y.o. female with PMH significant for DM2, history of DVT/PE (on February 2022 sp embolectomy and IVC filter placement) on chronic anticoagulation, diagnosed with uterine cancer 1 month later, treated with radiation and brachytherapy, currently cancer free.  Also with history of morbid obesity, chronic lower extremity lymphedema, history of endometrial cancer. Patient presented to the ED on 6/28 for evaluation of bilateral lower extremity pain and swelling for a month, progressively worsening mostly with ambulation.  Also associated with exertional shortness of breath.  Reports compliance to anticoagulation.  In the ED, patient was hemodynamically stable, labs mostly unremarkable. Lower extremity venous Doppler shows extensive occlusive thrombus noted throughout both femoral venous systems. CT angiogram of the chest shows small volume of chronic and nonocclusive PE remains in some of the right lower lobe branches, with clearance of the saddle embolus seen in February, and no convincing acute pulmonary embolus on this evaluation. Patient was started on heparin drip.  Vascular surgery was consulted. Admitted to hospitalist service. 6/29, patient underwent thrombectomy.  Subjective: Patient was seen and examined this morning.   Lying on bed.  Not in distress.  Feels better.  No new complaint.  Assessment/Plan: Recurrent bilateral DVT Residual pulmonary embolism -History of DVT/PE in February 2022 for which she received embolectomy, IVC filter and has been compliant to anticoagulation since. -However presented with worsening bilateral lower extremity swelling and pain with imaging showing extensive DVT. -Vascular surgery consult  appreciated. -6/29, patient underwent mechanical thrombectomy of the venous system of both lower extremities.   -Currently on heparin drip.   -Vascular surgery to follow and switch to oral anticoagulation when appropriate.  Elevated creatinine -Creatinine slightly elevated to 1.02 today probably related to angiogram yesterday. -We will continue to monitor.  Losartan to be held today. Recent Labs    09/06/20 0415 09/07/20 0404 09/16/20 0920 09/20/20 0327 09/25/20 0835 10/03/20 0835 10/27/20 1437 01/02/21 0534 01/03/21 0202 01/04/21 0021  BUN 12 12 9 9 14 10 15 13 12 11   CREATININE 0.67 0.60 0.79 0.70 0.93 0.83 0.78 0.98 0.85 1.02*   Type 2 diabetes mellitus -A1c 6.2 on 6/28. -Home meds include metformin which is currently on hold. -Currently on sliding scale insulin -Blood sugar level controlled to less than 150 consistently Recent Labs  Lab 01/03/21 1357 01/03/21 1625 01/03/21 1731 01/03/21 2139 01/04/21 0726  GLUCAP 92 90 105* 145* 139*   Morbid obesity  -Body mass index is 46.4 kg/m. Patient has been advised to make an attempt to improve diet and exercise patterns to aid in weight loss.  Hypertension -We will keep losartan on hold because of slightly elevated creatinine this morning.  History of endometrial carcinoma -Status post radiation and brachytherapy -Follow-up with oncology as an outpatient at [facility]  Mobility: Encourage ambulation after procedure.  Was previously using walker.  Needs PT eval when cleared by vascular surgery Code Status:   Code Status: Full Code  Nutritional status: Body mass index is 46.4 kg/m.     Diet Order             Diet regular Room service appropriate? Yes; Fluid consistency: Thin  Diet effective now  DVT prophylaxis:   Currently on heparin drip   Antimicrobials: None Fluid: Heparin drip Consultants: Vascular surgery Family Communication: Family not at bedside  Status is: Inpatient  Remains  inpatient appropriate because: POD1 mechanical thrombectomy  Dispo: The patient is from: Home              Anticipated d/c is to: Hopefully home in 1 to 2 days              Patient currently is not medically stable to d/c.   Difficult to place patient No     Infusions:   sodium chloride     heparin 1,950 Units/hr (01/04/21 0848)    Scheduled Meds:  insulin aspart  0-20 Units Subcutaneous TID WC   mometasone-formoterol  2 puff Inhalation BID   rosuvastatin  10 mg Oral Daily   sodium chloride flush  3 mL Intravenous Q12H    Antimicrobials: Anti-infectives (From admission, onward)    Start     Dose/Rate Route Frequency Ordered Stop   01/03/21 1405  ceFAZolin (ANCEF) 2-4 GM/100ML-% IVPB       Note to Pharmacy: Corlis Hove   : cabinet override      01/03/21 1405 01/04/21 0214   01/02/21 1918  ceFAZolin (ANCEF) IVPB 2g/100 mL premix  Status:  Discontinued        2 g 200 mL/hr over 30 Minutes Intravenous 30 min pre-op 01/02/21 1918 01/03/21 1641       PRN meds: sodium chloride, acetaminophen **OR** acetaminophen, morphine injection, ondansetron **OR** ondansetron (ZOFRAN) IV, sodium chloride flush   Objective: Vitals:   01/04/21 0552 01/04/21 0847  BP: 107/62 138/71  Pulse: 92 95  Resp: 19 16  Temp: 98.1 F (36.7 C) 97.8 F (36.6 C)  SpO2: 96% 96%    Intake/Output Summary (Last 24 hours) at 01/04/2021 0931 Last data filed at 01/04/2021 0055 Gross per 24 hour  Intake --  Output 400 ml  Net -400 ml    Filed Weights   01/02/21 1019  Weight: 100.7 kg   Weight change:  Body mass index is 46.4 kg/m.   Physical Exam: General exam: Pleasant, elderly Caucasian female, morbidly obese.  Not in physical distress Skin: No rashes, lesions or ulcers. HEENT: Atraumatic, normocephalic, no obvious bleeding Lungs: Clear to auscultation bilaterally CVS: Regular rate and rhythm, no murmur GI/Abd soft, nontender, nondistended, bowel sound present CNS: Alert, awake,  oriented x3 Psychiatry: Mood appropriate Extremities: Chronic bilateral lower extremity lymphedema  Data Review: I have personally reviewed the laboratory data and studies available.  Recent Labs  Lab 01/02/21 0534 01/03/21 0202 01/04/21 0021  WBC 6.7 4.5 5.2  NEUTROABS  --   --  4.1  HGB 10.6* 9.6* 9.4*  HCT 32.6* 30.2* 29.0*  MCV 90.3 91.5 90.6  PLT 306 258 274    Recent Labs  Lab 01/02/21 0534 01/03/21 0202 01/04/21 0021  NA 136 136 136  K 3.9 4.3 4.6  CL 103 107 104  CO2 23 26 26   GLUCOSE 140* 110* 136*  BUN 13 12 11   CREATININE 0.98 0.85 1.02*  CALCIUM 8.6* 8.3* 8.1*     F/u labs ordered Unresulted Labs (From admission, onward)     Start     Ordered   01/04/21 1500  Heparin level (unfractionated)  Once-Timed,   TIMED       Question:  Specimen collection method  Answer:  Lab=Lab collect   01/04/21 0924   01/04/21 0500  CBC with Differential/Platelet  Daily,   R      01/03/21 0900   01/04/21 1224  Basic metabolic panel  Daily,   R      01/03/21 0900   Signed and Held  CBC  Tomorrow morning,   R        Signed and Held            Signed, Terrilee Croak, MD [facility] 01/04/2021

## 2021-01-04 NOTE — TOC Initial Note (Signed)
Transition of Care [facility]) - Initial/Assessment Note    Patient Details  Name: Courtney Mack MRN: [id number] Date of Birth: Jun 06, 1956  [facility] CM/SW Contact:    Shelbie Hutching, RN Phone Number: 01/04/2021, 1:25 PM  Clinical Narrative:                 Patient admitted to the hospital with leg swelling and bilateral lower extremity DVTs.  Patient underwent thrombectomy yesterday with vascular.  RNCM met with patient at the bedside today.  Patient is from home where she lives with her 2 daughters.  Patient is independent at home and walks with a walker, she does have a wheelchair that she uses when the leave the house to go out.  Patient is current with her PCP at [facility] and uses [facility] for prescriptions.  Patient would like to have home health services at discharge, no preference in agency.  [facility] has accepted the referral for RN, PT, and OT.     Expected Discharge Plan: [place] Barriers to Discharge: Continued Medical Work up   Patient Goals and CMS Choice Patient states their goals for this hospitalization and ongoing recovery are:: For her legs to get better CMS Medicare.gov Compare Post Acute Care list provided to:: Patient Choice offered to / list presented to : Patient  Expected Discharge Plan and Services Expected Discharge Plan: [place]   Discharge Planning Services: CM Consult Post Acute Care Choice: Home Health                   DME Arranged: N/A DME Agency: NA       HH Arranged: RN, PT, OT HH Agency: [facility] Date [place]: 01/04/21 Time HH Agency Contacted: 31 Representative spoke with at [place]: [place]  Prior Living Arrangements/Services   Lives with:: Adult Children Patient language and need for interpreter reviewed:: Yes Do you feel safe going back to the place where you live?: Yes      Need for Family Participation in Patient Care: Yes (Comment)  (DVT) Care giver support system in place?: Yes (comment) (daughters) Current home services: DME (walker, cane, lift chair) Criminal Activity/Legal Involvement Pertinent to Current Situation/Hospitalization: No - Comment as needed  Activities of Daily Living Home Assistive Devices/Equipment: Environmental consultant (specify type), Wheelchair, Raised toilet seat with rails, CBG Meter, Grab bars in shower ADL Screening (condition at time of admission) Patient's cognitive ability adequate to safely complete daily activities?: Yes Is the patient deaf or have difficulty hearing?: No Does the patient have difficulty seeing, even when wearing glasses/contacts?: No Does the patient have difficulty concentrating, remembering, or making decisions?: No Patient able to express need for assistance with ADLs?: Yes Does the patient have difficulty dressing or bathing?: No Independently performs ADLs?: No Communication: Independent Dressing (OT): Needs assistance Is this a change from baseline?: Pre-admission baseline Grooming: Independent with device (comment) Feeding: Independent Bathing: Needs assistance Is this a change from baseline?: Change from baseline, expected to last <3 days Toileting: Needs assistance Is this a change from baseline?: Change from baseline, expected to last <3 days In/Out Bed: Needs assistance Is this a change from baseline?: Change from baseline, expected to last <3 days Walks in Home: Independent with device (comment) Does the patient have difficulty walking or climbing stairs?: Yes Weakness of Legs: Both Weakness of Arms/Hands: None  Permission Sought/Granted Permission sought to share information with : Case Manager, Family Supports,  Other (comment) Permission granted to share information with : Yes, Verbal Permission Granted  Share Information with NAME: Otis Peak and Kyra Manges  Permission granted to share info w AGENCY: home health agencies  Permission granted to share info w  Relationship: daughters     Emotional Assessment Appearance:: Appears older than stated age Attitude/Demeanor/Rapport: Engaged Affect (typically observed): Accepting Orientation: : Oriented to Self, Oriented to Place, Oriented to  Time, Oriented to Situation Alcohol / Substance Use: Not Applicable Psych Involvement: No (comment)  Admission diagnosis:  Intertrigo [L30.4] Lymphedema [I89.0] Acute bilateral deep vein thrombosis (DVT) of femoral veins (HCC) [I82.413] Patient Active Problem List   Diagnosis Date Noted   Acute bilateral deep vein thrombosis (DVT) of femoral veins (HCC) 01/02/2021   IDA (iron deficiency anemia) 09/21/2020   Endometrial cancer ([place]) 09/13/2020   Acute blood loss anemia 09/06/2020   Vaginal bleeding 09/06/2020   DVT (deep venous thrombosis) ([place]) 09/06/2020   Anemia, unspecified 08/24/2020   Morbidly obese ([place]) 08/23/2020   Essential hypertension 08/23/2020   Diabetes mellitus without complication ([place]) 08/67/6195   Dyslipidemia 08/23/2020   Acute respiratory failure with hypoxia ([place])    Pulmonary embolism ([place]) 08/18/2020   Vaginal bleeding, abnormal 08/18/2020   Obesity, morbid, BMI 50 or higher ([place]) 08/18/2020   PCP:  [facility] [place]:   [id number] [address], [address] AT [address] [place] [address] Phone: [phone] Fax: [phone]     Social Determinants of Health (SDOH) Interventions    Readmission Risk Interventions No flowsheet data found.

## 2021-01-04 NOTE — Consult Note (Addendum)
[place] for heparin infusion Indication: DVT and PE  Patient Measurements: Heparin Dosing Weight: 66 kg  Labs: Recent Labs    01/02/21 0534 01/02/21 1051 01/02/21 1720 01/03/21 0202 01/03/21 0854 01/04/21 0021 01/04/21 0653 01/04/21 1442 01/04/21 2138  HGB 10.6*  --   --  9.6*  --  9.4*  --   --   --   HCT 32.6*  --   --  30.2*  --  29.0*  --   --   --   PLT 306  --   --  258  --  274  --   --   --   APTT  --  26  --   --   --   --   --   --   --   LABPROT  --  14.9  --   --   --   --   --   --   --   INR  --  1.2  --   --   --   --   --   --   --   HEPARINUNFRC  --  <0.10*   < > 0.17*   < > 0.28* 0.25* 0.24* 0.50  CREATININE 0.98  --   --  0.85  --  1.02*  --   --   --   TROPONINIHS 4  --   --   --   --   --   --   --   --    < > = values in this interval not displayed.    Estimated Creatinine Clearance: 55.5 mL/min (A) (by C-G formula based on SCr of 1.02 mg/dL (H)).   Medical History: Past Medical History:  Diagnosis Date   Diabetes mellitus without complication ([place])    DVT (deep venous thrombosis) ([place]) 09/06/2020   High cholesterol    Hx of blood clots    Hypertension    IDA (iron deficiency anemia) 09/21/2020    Assessment: KM is a 65 YO female who presented to the ED on 01/02/21 with leg swelling and SOB. Doppler found thrombus throughout both femoral vein systems. KM was on Xarelto PTA and had a dose evening 6/27. Patient is POD 1 thrombectomy by vascular.  Baseline aPTT 26s, INR 1.2, HL <0.10. Baseline CBC notable for mild anemia  6/28 1058 IV heparin 2000 unit bolus followed by continuous infusion at 1050 units/hr 6/28 1720 HL < 0.10 on 1050 units/hr 6/29 0202 HL 0.17, subtherapeutic on 1300 units/hr 6/29 0854 HL 0.24, subtherapeutic on 1500 units/hr 6/30 0021 HL 0.28, subtherapeutic 6/30 0653 HL 0.25, subtherapeutic 6/30 1442 HL 0.24. subtherapeutic 6/30 2138 HL 0.50, therapeutic x 1  Goal of Therapy:   Heparin level 0.3-0.7 units/ml Monitor platelets by anticoagulation protocol: Yes   Plan:  -Continue heparin infusion rate at 2200 units/hr -Recheck HL with AM labs to confirm  --Daily CBC per protocol while on IV heparin  Renda Rolls, PharmD, [facility] 01/04/2021 10:38 PM

## 2021-01-04 NOTE — Consult Note (Signed)
[place] for heparin infusion Indication: DVT and PE  Patient Measurements: Heparin Dosing Weight: 66 kg  Labs: Recent Labs    01/02/21 0534 01/02/21 1051 01/02/21 1720 01/03/21 0202 01/03/21 0854 01/04/21 0021 01/04/21 0653  HGB 10.6*  --   --  9.6*  --  9.4*  --   HCT 32.6*  --   --  30.2*  --  29.0*  --   PLT 306  --   --  258  --  274  --   APTT  --  26  --   --   --   --   --   LABPROT  --  14.9  --   --   --   --   --   INR  --  1.2  --   --   --   --   --   HEPARINUNFRC  --  <0.10*   < > 0.17* 0.24* 0.28* 0.25*  CREATININE 0.98  --   --  0.85  --  1.02*  --   TROPONINIHS 4  --   --   --   --   --   --    < > = values in this interval not displayed.    Estimated Creatinine Clearance: 55.5 mL/min (A) (by C-G formula based on SCr of 1.02 mg/dL (H)).   Medical History: Past Medical History:  Diagnosis Date   Diabetes mellitus without complication ([place])    DVT (deep venous thrombosis) ([place]) 09/06/2020   High cholesterol    Hx of blood clots    Hypertension    IDA (iron deficiency anemia) 09/21/2020    Assessment: KM is a 65 YO female who presented to the ED on 01/02/21 with leg swelling and SOB. Doppler found thrombus throughout both femoral vein systems. KM was on Xarelto PTA and had a dose evening 6/27. Patient is POD 1 thrombectomy by vascular.  Baseline aPTT 26s, INR 1.2, HL <0.10. Baseline CBC notable for mild anemia  6/28 1058 IV heparin 2000 unit bolus followed by continuous infusion at 1050 units/hr 6/28 1720 HL < 0.10 on 1050 units/hr 6/29 0202 HL 0.17, subtherapeutic on 1300 units/hr 6/29 0854 HL 0.24, subtherapeutic on 1500 units/hr 6/30 0021 HL 0.28, subtherapeutic 6/30 0653 HL 0.25, subtherapeutic  Goal of Therapy:  Heparin level 0.3-0.7 units/ml Monitor platelets by anticoagulation protocol: Yes   Plan:  -Bolus of 2000 units x 1 -Increase heparin infusion rate to 1950 units/hr -Check HL 6 hours after  rate change  --Daily CBC per protocol while on IV heparin  Lu Duffel, PharmD, BCPS Clinical Pharmacist 01/04/2021 7:57 AM

## 2021-01-04 NOTE — Consult Note (Signed)
[place] for heparin infusion Indication: DVT and PE  Patient Measurements: Heparin Dosing Weight: 66 kg  Labs: Recent Labs    01/02/21 0534 01/02/21 1051 01/02/21 1720 01/03/21 0202 01/03/21 0854 01/04/21 0021 01/04/21 0653 01/04/21 1442 01/04/21 2138  HGB 10.6*  --   --  9.6*  --  9.4*  --   --   --   HCT 32.6*  --   --  30.2*  --  29.0*  --   --   --   PLT 306  --   --  258  --  274  --   --   --   APTT  --  26  --   --   --   --   --   --   --   LABPROT  --  14.9  --   --   --   --   --   --   --   INR  --  1.2  --   --   --   --   --   --   --   HEPARINUNFRC  --  <0.10*   < > 0.17*   < > 0.28* 0.25* 0.24* 0.50  CREATININE 0.98  --   --  0.85  --  1.02*  --   --   --   TROPONINIHS 4  --   --   --   --   --   --   --   --    < > = values in this interval not displayed.    Estimated Creatinine Clearance: 55.5 mL/min (A) (by C-G formula based on SCr of 1.02 mg/dL (H)).   Medical History: Past Medical History:  Diagnosis Date   Diabetes mellitus without complication ([place])    DVT (deep venous thrombosis) ([place]) 09/06/2020   High cholesterol    Hx of blood clots    Hypertension    IDA (iron deficiency anemia) 09/21/2020    Assessment: Courtney Mack is a 65 YO female who presented to the ED on 01/02/21 with leg swelling and SOB. Doppler found thrombus throughout both femoral vein systems. Courtney Mack was on Xarelto PTA and had a dose evening 6/27. Patient is POD 1 thrombectomy by vascular.  Baseline aPTT 26s, INR 1.2, HL <0.10. Baseline CBC notable for mild anemia  6/28 1058 IV heparin 2000 unit bolus followed by continuous infusion at 1050 units/hr 6/28 1720 HL < 0.10 on 1050 units/hr 6/29 0202 HL 0.17, subtherapeutic on 1300 units/hr 6/29 0854 HL 0.24, subtherapeutic on 1500 units/hr 6/30 0021 HL 0.28, subtherapeutic 6/30 0653 HL 0.25, subtherapeutic 6/30 1442 HL 0.24. subtherapeutic 6/30 2138 HL 0.50, therapeutic x 1  Goal of Therapy:   Heparin level 0.3-0.7 units/ml Monitor platelets by anticoagulation protocol: Yes   Plan:  --Heparin level is therapeutic x 1 --Continue heparin infusion at 2200 units/hr --Re-check confirmatory HL in 6 hours --Daily CBC per protocol while on IV heparin  Courtney Mack 01/04/2021 10:36 PM

## 2021-01-05 ENCOUNTER — Telehealth: Payer: Self-pay

## 2021-01-05 DIAGNOSIS — C541 Malignant neoplasm of endometrium: Secondary | ICD-10-CM

## 2021-01-05 LAB — CBC WITH DIFFERENTIAL/PLATELET
Abs Immature Granulocytes: 0.07 10*3/uL (ref 0.00–0.07)
Basophils Absolute: 0 10*3/uL (ref 0.0–0.1)
Basophils Relative: 1 %
Eosinophils Absolute: 0.2 10*3/uL (ref 0.0–0.5)
Eosinophils Relative: 3 %
HCT: 27.8 % — ABNORMAL LOW (ref 36.0–46.0)
Hemoglobin: 9.2 g/dL — ABNORMAL LOW (ref 12.0–15.0)
Immature Granulocytes: 1 %
Lymphocytes Relative: 12 %
Lymphs Abs: 0.7 10*3/uL (ref 0.7–4.0)
MCH: 29.2 pg (ref 26.0–34.0)
MCHC: 33.1 g/dL (ref 30.0–36.0)
MCV: 88.3 fL (ref 80.0–100.0)
Monocytes Absolute: 0.5 10*3/uL (ref 0.1–1.0)
Monocytes Relative: 8 %
Neutro Abs: 4.5 10*3/uL (ref 1.7–7.7)
Neutrophils Relative %: 75 %
Platelets: 272 10*3/uL (ref 150–400)
RBC: 3.15 MIL/uL — ABNORMAL LOW (ref 3.87–5.11)
RDW: 17.3 % — ABNORMAL HIGH (ref 11.5–15.5)
WBC: 6 10*3/uL (ref 4.0–10.5)
nRBC: 0 % (ref 0.0–0.2)

## 2021-01-05 LAB — BASIC METABOLIC PANEL
Anion gap: 4 — ABNORMAL LOW (ref 5–15)
BUN: 11 mg/dL (ref 8–23)
CO2: 25 mmol/L (ref 22–32)
Calcium: 8.1 mg/dL — ABNORMAL LOW (ref 8.9–10.3)
Chloride: 106 mmol/L (ref 98–111)
Creatinine, Ser: 0.82 mg/dL (ref 0.44–1.00)
GFR, Estimated: >60 mL/min (ref >60)
Glucose, Bld: 156 mg/dL — ABNORMAL HIGH (ref 70–99)
Potassium: 4 mmol/L (ref 3.5–5.1)
Sodium: 135 mmol/L (ref 135–145)

## 2021-01-05 LAB — GLUCOSE, CAPILLARY
Glucose-Capillary: 134 mg/dL — ABNORMAL HIGH (ref 70–99)
Glucose-Capillary: 139 mg/dL — ABNORMAL HIGH (ref 70–99)

## 2021-01-05 LAB — HEPARIN LEVEL (UNFRACTIONATED): Heparin Unfractionated: 0.45 IU/mL (ref 0.30–0.70)

## 2021-01-05 MED ORDER — ENOXAPARIN SODIUM 100 MG/ML IJ SOSY
1.0000 mg/kg | PREFILLED_SYRINGE | Freq: Two times a day (BID) | INTRAMUSCULAR | Status: DC
  Administered 2021-01-05: 100 mg via SUBCUTANEOUS
  Filled 2021-01-05: qty 1

## 2021-01-05 MED ORDER — ENOXAPARIN SODIUM 100 MG/ML IJ SOSY: 1.0000 mg/kg | PREFILLED_SYRINGE | Freq: Two times a day (BID) | INTRAMUSCULAR | 2 refills | Status: DC

## 2021-01-05 NOTE — Consult Note (Addendum)
[place] for therapeutic lovenox Indication: DVT and PE   Labs: Recent Labs    01/03/21 0202 01/03/21 0854 01/04/21 0021 01/04/21 0653 01/04/21 1442 01/04/21 2138 01/05/21 0555  HGB 9.6*  --  9.4*  --   --   --  9.2*  HCT 30.2*  --  29.0*  --   --   --  27.8*  PLT 258  --  274  --   --   --  272  HEPARINUNFRC 0.17*   < > 0.28*   < > 0.24* 0.50 0.45  CREATININE 0.85  --  1.02*  --   --   --  0.82   < > = values in this interval not displayed.    Estimated Creatinine Clearance: 69 mL/min (by C-G formula based on SCr of 0.82 mg/dL).   Medical History: Past Medical History:  Diagnosis Date   Diabetes mellitus without complication ([name])    DVT (deep venous thrombosis) ([name]) 09/06/2020   High cholesterol    Hx of blood clots    Hypertension    IDA (iron deficiency anemia) 09/21/2020    Assessment: Courtney Mack is a 65 YO female who presented to the ED on 01/02/21 with leg swelling and SOB. Doppler found thrombus throughout both femoral vein systems.   Courtney Mack was on Xarelto PTA and had a dose evening 6/27. Patient is POD 2 thrombectomy by vascular.  Baseline aPTT 26s, INR 1.2, HL <0.10. Baseline CBC notable for mild anemia  6/28 1058 IV heparin 2000 unit bolus followed by continuous infusion at 1050 units/hr 6/28 1720 HL < 0.10 on 1050 units/hr 6/29 0202 HL 0.17, subtherapeutic on 1300 units/hr 6/29 0854 HL 0.24, subtherapeutic on 1500 units/hr 6/30 0021 HL 0.28, subtherapeutic 6/30 0653 HL 0.25, subtherapeutic 6/30 1442 HL 0.24. subtherapeutic 6/30 2138 HL 0.50, therapeutic x 1 7/01 0555 HL 0.45 therapeutic x 2  Now transitioning to therapeutic lovenox  Goal of Therapy:  Monitor platelets by anticoagulation protocol: Yes   Plan:  - Will stop heparin drip and change to therapeutic lovenox  (1mg /kg q12 actual body weight - 100mg  q12)  --Daily CBC/SCr on therapeutic lovenox  Courtney Mack, PharmD, BCPS Clinical Pharmacist 01/05/2021  11:36 AM

## 2021-01-05 NOTE — Care Management Important Message (Signed)
Important Message  Patient Details  Name: Courtney Mack MRN: [id number] Date of Birth: 08-15-1955   Medicare Important Message Given:  Yes     [name] 01/05/2021, 11:31 AM

## 2021-01-05 NOTE — Evaluation (Signed)
Physical Therapy Evaluation Patient Details Name: Courtney Mack MRN: [id number] DOB: December 11, 1955 Today's Date: 01/05/2021   History of Present Illness  Pt is a 65 y.o. female presenting to hospital 6/28 with B LE edema and SOB with exertion.  Pt admitted with acute B LE DVT and inoperable uterine CA.  S/p 6/29 mechanical thrombectomy (to R and L common femoral, external, and common iliac veins) secondary extensive B LE DVT.   PMH includes DVT/PE in March 2022 s/p thrombectomy, DM, htn, IDA, endometrial CA, vaginal bleeding, acute respiratory failure with hypoxia, IVC filter insertion 08/18/2020, pulmonary thrombectomy 08/18/20.  Clinical Impression  Prior to hospital admission, pt was modified independent ambulating with 4ww within home; used manual w/c in community; lives with 2 daughters (1 always available to assist as needed).  Pt requiring assist for bed mobility (pt reports sleeping in lift recliner at home d/t difficulties with bed mobility).  Bed height elevated in order to stand up to RW with CGA to min assist x1 and then pt able to ambulate 15 feet with bariatric RW (limited distance ambulating d/t SOB, fatigue, and HR increasing from 99 bpm at rest to 143 bpm with activity; HR decreased to around 105 bpm at rest end of session; O2 sats 94% or greater on room air during sessions activities).  Pt would benefit from skilled PT to address noted impairments and functional limitations (see below for any additional details).  Upon hospital discharge, pt would benefit from SNF to improve overall strength, activity tolerance, endurance, and independence with functional mobility but may be able to discharge home w/c level initially with home health PT if has enough support/assist at home.    Follow Up Recommendations SNF    Equipment Recommendations  Rolling walker with 5" wheels;3in1 (PT) (bariatric)    Recommendations for Other Services OT consult     Precautions / Restrictions  Precautions Precautions: Fall Restrictions Weight Bearing Restrictions: No      Mobility  Bed Mobility Overal bed mobility: Needs Assistance Bed Mobility: Supine to Sit;Sit to Supine     Supine to sit: Mod assist;HOB elevated (assist for trunk) Sit to supine: +2 for physical assistance (2 assist to boost pt up in bed using bed sheet)   General bed mobility comments: vc's for technique    Transfers Overall transfer level: Needs assistance Equipment used: Rolling walker (2 wheeled) (bariatric) Transfers: Sit to/from Stand Sit to Stand: Min guard;Min assist;From elevated surface         General transfer comment: increased effort to stand  Ambulation/Gait Ambulation/Gait assistance: Min guard Gait Distance (Feet): 15 Feet Assistive device: Rolling walker (2 wheeled) (bariatric)   Gait velocity: decreased   General Gait Details: decreased B LE step length/foot clearance/heelstrike  Stairs            Wheelchair Mobility    Modified Rankin (Stroke Patients Only)       Balance Overall balance assessment: Needs assistance Sitting-balance support: No upper extremity supported;Feet supported Sitting balance-Leahy Scale: Good Sitting balance - Comments: steady sitting reaching within BOS   Standing balance support: Single extremity supported Standing balance-Leahy Scale: Fair Standing balance comment: pt requiring at least single UE support for static standing balance                             Pertinent Vitals/Pain Pain Assessment: No/denies pain    Home Living Family/patient expects to be discharged to:: Private residence Living Arrangements: Children (  2 daughters) Available Help at Discharge: Family;Available PRN/intermittently;Available 24 hours/day Type of Home: House Home Access: Ramped entrance     Home Layout: One level Home Equipment: Walker - 4 wheels;Cane - single point Additional Comments: has Risk analyst    Prior Function  Level of Independence: Needs assistance   Gait / Transfers Assistance Needed: Modified independent ambulating with rollator household distances; uses w/c for community mobility (someone pushes pt in w/c); sleeps in lift recliner d/t difficulty with bed mobility  ADL's / Homemaking Assistance Needed: assist for lower body dressing as needed from daughters  Comments: Pt reports no recent falls     Hand Dominance   Dominant Hand: Right    Extremity/Trunk Assessment   Upper Extremity Assessment Upper Extremity Assessment: Generalized weakness    Lower Extremity Assessment Lower Extremity Assessment: Generalized weakness (B LE edema)    Cervical / Trunk Assessment Cervical / Trunk Assessment: Other exceptions Cervical / Trunk Exceptions: forward head/shoulders  Communication   Communication: No difficulties  Cognition Arousal/Alertness: Awake/alert Behavior During Therapy: WFL for tasks assessed/performed Overall Cognitive Status: Within Functional Limits for tasks assessed                                        General Comments  Nursing cleared pt for participation in physical therapy.  Pt agreeable to PT session.    Exercises     Assessment/Plan    PT Assessment Patient needs continued PT services  PT Problem List Decreased strength;Decreased activity tolerance;Decreased balance;Decreased mobility;Cardiopulmonary status limiting activity       PT Treatment Interventions DME instruction;Gait training;Functional mobility training;Therapeutic activities;Therapeutic exercise;Balance training;Patient/family education    PT Goals (Current goals can be found in the Care Plan section)  Acute Rehab PT Goals Patient Stated Goal: to go home PT Goal Formulation: With patient Time For Goal Achievement: 01/19/21 Potential to Achieve Goals: Fair    Frequency Min 2X/week   Barriers to discharge Decreased caregiver support      Co-evaluation                AM-PAC PT "6 Clicks" Mobility  Outcome Measure Help needed turning from your back to your side while in a flat bed without using bedrails?: A Lot Help needed moving from lying on your back to sitting on the side of a flat bed without using bedrails?: A Lot Help needed moving to and from a bed to a chair (including a wheelchair)?: A Little Help needed standing up from a chair using your arms (e.g., wheelchair or bedside chair)?: A Little Help needed to walk in hospital room?: A Little Help needed climbing 3-5 steps with a railing? : Total 6 Click Score: 14    End of Session Equipment Utilized During Treatment: Gait belt Activity Tolerance: Other (comment) (Limited activity d/t elevated HR with activity) Patient left: in bed;with call bell/phone within reach;with bed alarm set Nurse Communication: Mobility status;Precautions;Other (comment) (pt's elevated HR with activity) PT Visit Diagnosis: Other abnormalities of gait and mobility (R26.89);Muscle weakness (generalized) (M62.81)    Time: 1478-2956 PT Time Calculation (min) (ACUTE ONLY): 41 min   Charges:   PT Evaluation $PT Eval Low Complexity: 1 Low PT Treatments $Therapeutic Activity: 8-22 mins       Leitha Bleak, PT 01/05/21, 11:44 AM

## 2021-01-05 NOTE — Progress Notes (Signed)
Courtney Mack to be D/C'd Home per MD order.  Discussed prescriptions and follow up appointments with the patient. Prescriptions given to patient, medication list explained in detail. Pt verbalized understanding.  Allergies as of 01/05/2021   No Known Allergies      Medication List     STOP taking these medications    dexamethasone 4 MG tablet Commonly known as: DECADRON   Ensure Max Protein Liqd   Fluticasone-Salmeterol 100-50 MCG/DOSE Aepb Commonly known as: ADVAIR   ketoconazole 2 % cream Commonly known as: NIZORAL   norethindrone 5 MG tablet Commonly known as: AYGESTIN   nystatin powder Commonly known as: MYCOSTATIN/NYSTOP   prochlorperazine 10 MG tablet Commonly known as: COMPAZINE   rivaroxaban 20 MG Tabs tablet Commonly known as: XARELTO       TAKE these medications    Dulera 100-5 MCG/ACT [place]ero Generic drug: mometasone-formoterol Inhale 2 puffs into the lungs 2 (two) times daily.   enoxaparin 100 MG/ML injection Commonly known as: LOVENOX Inject 1 mL (100 mg total) into the skin every 12 (twelve) hours.   losartan 25 MG tablet Commonly known as: COZAAR Take 1 tablet (25 mg total) by mouth daily.   metFORMIN 500 MG tablet Commonly known as: GLUCOPHAGE Take 1 tablet (500 mg total) by mouth 2 (two) times daily with a meal.   multivitamin with minerals Tabs tablet Take 1 tablet by mouth daily.   ondansetron 4 MG tablet Commonly known as: ZOFRAN Take 1 tablet (4 mg total) by mouth every 6 (six) hours as needed for nausea.   rosuvastatin 10 MG tablet Commonly known as: CRESTOR Take 1 tablet (10 mg total) by mouth daily.               Durable Medical Equipment  (From admission, onward)           Start     Ordered   01/05/21 1323  For home use only DME 3 n 1  Once        01/05/21 1322            Vitals:   01/05/21 0824 01/05/21 1133  BP: 130/60 118/63  Pulse: 91 83  Resp: 18   Temp: 98.3 F (36.8 C) 98.2 F (36.8 C)   SpO2: 97% 95%    Skin clean, dry and intact without evidence of skin break down, no evidence of skin tears noted. IV catheter discontinued intact. Site without signs and symptoms of complications. Dressing and pressure applied. Pt denies pain at this time. No complaints noted.  An After Visit Summary was printed and given to the patient. Patient escorted via [place], and D/C home via private auto.  Rolley Sims

## 2021-01-05 NOTE — Discharge Summary (Signed)
Physician Discharge Summary  Courtney Mack:[id number] DOB: 1956-04-09 DOA: 01/02/2021  PCP: [facility] [place] date: 01/02/2021 Discharge date: 01/05/2021  Admitted From: Home Discharge disposition: Home with home health PT, OT, RN   Code Status: Full Code  Diet Recommendation: Cardiac/diabetic diet  Discharge Diagnosis:   Principal Problem:   Acute bilateral deep vein thrombosis (DVT) of femoral veins (HCC) Active Problems:   Obesity, morbid, BMI 50 or higher ([place])   Essential hypertension   Diabetes mellitus without complication ([place])   Endometrial cancer [facility])    Chief Complaint  Patient presents with   Leg Swelling    Brief narrative: Courtney Mack is a 65 y.o. female with PMH significant for DM2, history of DVT/PE (on February 2022 sp embolectomy and IVC filter placement) on chronic anticoagulation, diagnosed with uterine cancer 1 month later, treated with radiation and brachytherapy, currently cancer free.  Also with history of morbid obesity, chronic lower extremity lymphedema, history of endometrial cancer. Patient presented to the ED on 6/28 for evaluation of bilateral lower extremity pain and swelling for a month, progressively worsening mostly with ambulation.  Also associated with exertional shortness of breath.  Reports compliance to anticoagulation.  In the ED, patient was hemodynamically stable, labs mostly unremarkable. Lower extremity venous Doppler shows extensive occlusive thrombus noted throughout both femoral venous systems. CT angiogram of the chest shows small volume of chronic and nonocclusive PE remains in some of the right lower lobe branches, with clearance of the saddle embolus seen in February, and no convincing acute pulmonary embolus on this evaluation. Patient was started on heparin drip.  Vascular surgery was consulted. Admitted to hospitalist service. 6/29, patient underwent  thrombectomy.  Subjective: Patient was seen and examined this morning.   Lying on bed.  Not in distress.  Feels better.  No new complaint.  Hospital course: Recurrent bilateral DVT Residual pulmonary embolism -History of DVT/PE in February 2022 for which she received embolectomy, IVC filter and has been compliant to anticoagulation since. -However presented with worsening bilateral lower extremity swelling and pain with imaging showing extensive DVT. -Vascular surgery consult appreciated.  Patient was started on heparin drip -6/29, patient underwent mechanical thrombectomy of the venous system of both lower extremities.   -Case was discussed with patient's hematologist/oncologist Dr. Tasia Catchings this morning.  Since patient got recurrent DVT/PE despite compliance with Xarelto, this constitutes a case of Xarelto failure.  As recommended, we will discharge her home full dose Lovenox twice daily.  I discussed this with patient and she agrees with the plan. -Patient will follow up with Dr. Tasia Catchings as an outpatient.  Type 2 diabetes mellitus -A1c 6.2 on 6/28. -Resume metformin at home. -Blood sugar level controlled to less than 150 consistently Recent Labs  Lab 01/04/21 1132 01/04/21 1643 01/04/21 2102 01/05/21 0743 01/05/21 1132  GLUCAP 154* 154* 119* 134* 139*  Morbid obesity  -Body mass index is 46.4 kg/m. Patient has been advised to make an attempt to improve diet and exercise patterns to aid in weight loss.  Hypertension -Resume losartan at home  History of endometrial carcinoma -Status post radiation and brachytherapy -Follow-up with oncology as an outpatient at [facility]:    Discharge Exam:   Vitals:   01/04/21 2006 01/05/21 0559 01/05/21 0824 01/05/21 1133  BP: 134/69 135/83 130/60 118/63  Pulse: 98 95 91 83  Resp: 18 20 18    Temp: 98.1 F (36.7 C) 98.2 F (36.8 C) 98.3 F (  36.8 C) 98.2 F (36.8 C)  TempSrc:  Oral Oral Oral  SpO2: 96% 94% 97% 95%  Weight:       Height:        Body mass index is 46.4 kg/m.  General exam: Pleasant elderly Caucasian female, morbidly obese.  Not in physical distress Skin: No rashes, lesions or ulcers. HEENT: Atraumatic, normocephalic, no obvious bleeding Lungs: Clear to auscultation bilaterally CVS: Regular rate and rhythm, no murmur GI/Abd soft, nontender, distended from obesity, CNS: Alert, awake, oriented x3 Psychiatry: Mood appropriate Extremities: Chronic bilateral lower extremity edema, no calf tenderness  Follow ups:   Discharge Instructions     Increase activity slowly   Complete by: As directed        Follow-up Information     Algernon Huxley, MD Follow up in 1 week(s).   Specialties: Vascular Surgery, Radiology, Interventional Cardiology Why: Can see Dew or Arna Medici. DVT. Will need bilateral lower extremity DVT study with visit. Contact information: [place] [address] [phone]         [facility] [place] Follow up.   Specialty: General Practice Contact information: [place] [address] [phone]                 Recommendations for Outpatient Follow-Up:   Follow-up with PCP as an outpatient Follow-up with oncology as an outpatient  Discharge Instructions:  Follow with Primary MD [facility] [place] in 7 days   Get CBC/BMP checked in next visit within 1 week by PCP or SNF MD ( we routinely change or add medications that can affect your baseline labs and fluid status, therefore we recommend that you get the mentioned basic workup next visit with your PCP, your PCP may decide not to get them or add new tests based on their clinical decision)  On your next visit with your PCP, please Get Medicines reviewed and adjusted.  Please request your PCP  to go over all Hospital Tests and Procedure/Radiological results at the follow up, please get all Hospital records sent to your Prim MD by  signing hospital release before you go home.  Activity: As tolerated with Full fall precautions use walker/cane & assistance as needed  For Heart failure patients - Check your Weight same time everyday, if you gain over 2 pounds, or you develop in leg swelling, experience more shortness of breath or chest pain, call your Primary MD immediately. Follow Cardiac Low Salt Diet and 1.5 lit/day fluid restriction.  If you have smoked or chewed Tobacco in the last 2 yrs please stop smoking, stop any regular Alcohol  and or any Recreational drug use.  If you experience worsening of your admission symptoms, develop shortness of breath, life threatening emergency, suicidal or homicidal thoughts you must seek medical attention immediately by calling 911 or calling your MD immediately  if symptoms less severe.  You Must read complete instructions/literature along with all the possible adverse reactions/side effects for all the Medicines you take and that have been prescribed to you. Take any new Medicines after you have completely understood and accpet all the possible adverse reactions/side effects.   Do not drive, operate heavy machinery, perform activities at heights, swimming or participation in water activities or provide baby sitting services if your were admitted for syncope or siezures until you have seen by Primary MD or a Neurologist and advised to do so again.  Do not drive when taking Pain medications.  Do not take more than prescribed Pain, Sleep and Anxiety Medications  Wear Seat belts while driving.   Please note You were cared for by a hospitalist during your hospital stay. If you have any questions about your discharge medications or the care you received while you were in the hospital after you are discharged, you can call the unit and asked to speak with the hospitalist on call if the hospitalist that took care of you is not available. Once you are discharged, your primary care physician  will handle any further medical issues. Please note that NO REFILLS for any discharge medications will be authorized once you are discharged, as it is imperative that you return to your primary care physician (or establish a relationship with a primary care physician if you do not have one) for your aftercare needs so that they can reassess your need for medications and monitor your lab values.    Time coordinating discharge: 35 minutes  Allergies as of 01/05/2021   No Known Allergies      Medication List     STOP taking these medications    dexamethasone 4 MG tablet Commonly known as: DECADRON   Ensure Max Protein Liqd   Fluticasone-Salmeterol 100-50 MCG/DOSE Aepb Commonly known as: ADVAIR   ketoconazole 2 % cream Commonly known as: NIZORAL   norethindrone 5 MG tablet Commonly known as: AYGESTIN   nystatin powder Commonly known as: MYCOSTATIN/NYSTOP   prochlorperazine 10 MG tablet Commonly known as: COMPAZINE   rivaroxaban 20 MG Tabs tablet Commonly known as: XARELTO       TAKE these medications    Dulera 100-5 MCG/ACT Aero Generic drug: mometasone-formoterol Inhale 2 puffs into the lungs 2 (two) times daily.   enoxaparin 100 MG/ML injection Commonly known as: LOVENOX Inject 1 mL (100 mg total) into the skin every 12 (twelve) hours.   losartan 25 MG tablet Commonly known as: COZAAR Take 1 tablet (25 mg total) by mouth daily.   metFORMIN 500 MG tablet Commonly known as: GLUCOPHAGE Take 1 tablet (500 mg total) by mouth 2 (two) times daily with a meal.   multivitamin with minerals Tabs tablet Take 1 tablet by mouth daily.   ondansetron 4 MG tablet Commonly known as: ZOFRAN Take 1 tablet (4 mg total) by mouth every 6 (six) hours as needed for nausea.   rosuvastatin 10 MG tablet Commonly known as: CRESTOR Take 1 tablet (10 mg total) by mouth daily.        The results of significant diagnostics from this hospitalization (including imaging,  microbiology, ancillary and laboratory) are listed below for reference.    Procedures and Diagnostic Studies:   DG Chest 2 View  Result Date: 01/02/2021 CLINICAL DATA:  Shortness of breath. EXAM: CHEST - 2 VIEW COMPARISON:  10/27/2020.  PET-CT 09/28/2020.  CT chest 08/18/2020. FINDINGS: Mediastinum and hilar structures normal. Heart size normal. Questionable nodular opacity right lung base. Follow-up PA and lateral chest x-ray suggested for further evaluation. No pleural effusion or pneumothorax. Degenerative change thoracic spine. IMPRESSION: Questionable nodular opacity right lung base. Follow-up PA lateral chest x-ray suggested for further evaluation. Electronically Signed   By: Marcello Moores  Register   On: 01/02/2021 06:05   CT Angio Chest PE W/Cm &/Or Wo Cm  Result Date: 01/02/2021 CLINICAL DATA:  65 year old female with bilateral lower extremity edema and shortness of breath. Endometrial cancer. Bilateral pulmonary emboli in February this year. EXAM: CT ANGIOGRAPHY CHEST WITH CONTRAST TECHNIQUE: Multidetector CT imaging of the chest was performed  using the standard protocol during bolus administration of intravenous contrast. Multiplanar CT image reconstructions and MIPs were obtained to evaluate the vascular anatomy. CONTRAST:  151mL OMNIPAQUE IOHEXOL 350 MG/ML SOLN COMPARISON:  PET-CT 09/28/2020 chest CTA 08/18/2020. FINDINGS: Cardiovascular: Good contrast bolus timing in the pulmonary arterial tree. Clearance of the saddle embolus demonstrated in February. Very small volume of chronic PE suspected at the arborization of the right lower lung pulmonary arteries (series 5, images 130 through 146. Similar nonocclusive thrombus adherent to the wall in the right lower lobe segmental branch on image 169, and that clot also bifurcates distally. Those arteries were completely opacified in February and now appear patent. No pulmonary artery occlusion or convincing acute pulmonary embolus identified today. Cardiac  size within normal limits. No pericardial effusion. Negative visible aorta aside from mild calcified atherosclerosis. No definite calcified coronary artery plaque. Mediastinum/Nodes: No mediastinal lymphadenopathy. Lungs/Pleura: Major airways are patent with mild upper lobe mosaic attenuation in both lungs. Patchy additional distal peribronchial opacity in the anterior inferior upper lobe, most resembles atelectasis. No pleural effusion or consolidation. Upper Abdomen: Negative visible liver, gallbladder, spleen, pancreas, adrenal glands, kidneys, and bowel in the upper abdomen. Musculoskeletal: No acute or suspicious osseous lesion identified. Dependent body wall edema is increased compared to March. Review of the MIP images confirms the above findings. IMPRESSION: 1. Small volume of chronic and nonocclusive PE remains in some of the right lower lobe branches, with clearance of the saddle embolus seen in February, and no convincing acute pulmonary embolus today. 2. Mild bilateral upper lobe gas trapping and atelectasis. 3. No metastatic disease identified in the chest. Electronically Signed   By: Genevie Ann M.D.   On: 01/02/2021 07:50   PERIPHERAL VASCULAR CATHETERIZATION  Result Date: 01/03/2021 See surgical note for result.  US Venous Img Lower Bilateral (DVT)  Result Date: 01/02/2021 CLINICAL DATA:  Bilateral lower extremity swelling. EXAM: BILATERAL LOWER EXTREMITY VENOUS DOPPLER ULTRASOUND TECHNIQUE: Gray-scale sonography with compression, as well as color and duplex ultrasound, were performed to evaluate the deep venous system(s) from the level of the common femoral vein through the popliteal and proximal calf veins. COMPARISON:  Ultrasound 08/19/2020 and 08/18/2020 FINDINGS: VENOUS This was a suboptimal exam due to patient's body habitus and pain. Patient would not allow evaluation of the popliteal veins and calf veins due to pain. Extensive occlusive thrombus noted throughout both the femoral venous  systems. No other focal abnormality identified. OTHER None. IMPRESSION: This is a suboptimal exam due to patient's body habitus and pain. Patient would not allow evaluation of the popliteal veins and calf veins due to pain. Extensive occlusive thrombus noted throughout both femoral venous systems. Critical Value/emergent results were called by telephone at the time of interpretation on 01/02/2021 at 8:17 am to provider Dr. Joni Fears , who verbally acknowledged these results. Electronically Signed   By: Marcello Moores  Register   On: 01/02/2021 08:20     Labs:   Basic Metabolic Panel: Recent Labs  Lab 01/02/21 0534 01/03/21 0202 01/04/21 0021 01/05/21 0555  NA 136 136 136 135  K 3.9 4.3 4.6 4.0  CL 103 107 104 106  CO2 23 26 26 25   GLUCOSE 140* 110* 136* 156*  BUN 13 12 11 11   CREATININE 0.98 0.85 1.02* 0.82  CALCIUM 8.6* 8.3* 8.1* 8.1*   GFR Estimated Creatinine Clearance: 69 mL/min (by C-G formula based on SCr of 0.82 mg/dL). Liver Function Tests: No results for input(s): AST, ALT, ALKPHOS, BILITOT, PROT, ALBUMIN in the last  168 hours. No results for input(s): LIPASE, AMYLASE in the last 168 hours. No results for input(s): AMMONIA in the last 168 hours. Coagulation profile Recent Labs  Lab 01/02/21 1051  INR 1.2    CBC: Recent Labs  Lab 01/02/21 0534 01/03/21 0202 01/04/21 0021 01/05/21 0555  WBC 6.7 4.5 5.2 6.0  NEUTROABS  --   --  4.1 4.5  HGB 10.6* 9.6* 9.4* 9.2*  HCT 32.6* 30.2* 29.0* 27.8*  MCV 90.3 91.5 90.6 88.3  PLT 306 258 274 272   Cardiac Enzymes: No results for input(s): CKTOTAL, CKMB, CKMBINDEX, TROPONINI in the last 168 hours. BNP: Invalid input(s): POCBNP CBG: Recent Labs  Lab 01/04/21 1132 01/04/21 1643 01/04/21 2102 01/05/21 0743 01/05/21 1132  GLUCAP 154* 154* 119* 134* 139*   D-Dimer No results for input(s): DDIMER in the last 72 hours. Hgb A1c Recent Labs    01/02/21 1358  HGBA1C 6.2*   Lipid Profile No results for input(s): CHOL,  HDL, LDLCALC, TRIG, CHOLHDL, LDLDIRECT in the last 72 hours. Thyroid function studies No results for input(s): TSH, T4TOTAL, T3FREE, THYROIDAB in the last 72 hours.  Invalid input(s): FREET3 Anemia work up No results for input(s): VITAMINB12, FOLATE, FERRITIN, TIBC, IRON, RETICCTPCT in the last 72 hours. Microbiology Recent Results (from the past 240 hour(s))  Culture, blood (routine x 2)     Status: None (Preliminary result)   Collection Time: 01/02/21  6:37 AM   Specimen: BLOOD  Result Value Ref Range Status   Specimen Description BLOOD LEFT FA  Final   Special Requests   Final    BOTTLES DRAWN AEROBIC AND ANAEROBIC Blood Culture results may not be optimal due to an inadequate volume of blood received in culture bottles   Culture   Final    NO GROWTH 3 DAYS Performed at [facility], [address] [place], [place] [address]    Report Status PENDING  Incomplete  Culture, blood (routine x 2)     Status: None (Preliminary result)   Collection Time: 01/02/21  6:37 AM   Specimen: BLOOD  Result Value Ref Range Status   Specimen Description BLOOD RIGHT [facility]  Final   Special Requests   Final    BOTTLES DRAWN AEROBIC AND ANAEROBIC Blood Culture adequate volume   Culture   Final    NO GROWTH 3 DAYS Performed at [facility], [address] [place], [place] [address]    Report Status PENDING  Incomplete  Resp Panel by RT-PCR (Flu A&B, Covid) Nasopharyngeal Swab     Status: None   Collection Time: 01/02/21  6:37 AM   Specimen: Nasopharyngeal Swab; Nasopharyngeal(NP) swabs in vial transport medium  Result Value Ref Range Status   SARS Coronavirus 2 by RT PCR NEGATIVE NEGATIVE Final    Comment: (NOTE) SARS-CoV-2 target nucleic acids are NOT DETECTED.  The SARS-CoV-2 RNA is generally detectable in upper respiratory specimens during the acute phase of infection. The lowest concentration of SARS-CoV-2 viral copies this assay can detect is 138 copies/mL. A negative  result does not preclude SARS-Cov-2 infection and should not be used as the sole basis for treatment or other patient management decisions. A negative result may occur with  improper specimen collection/handling, submission of specimen other than nasopharyngeal swab, presence of viral mutation(s) within the areas targeted by this assay, and inadequate number of viral copies(<138 copies/mL). A negative result must be combined with clinical observations, patient history, and epidemiological information. The expected result is Negative.  Fact  Sheet for Patients:  EntrepreneurPulse.com.au  Fact Sheet for Healthcare Providers:  IncredibleEmployment.be  This test is no t yet approved or cleared by the [place] FDA and  has been authorized for detection and/or diagnosis of SARS-CoV-2 by FDA under an Emergency Use Authorization (EUA). This EUA will remain  in effect (meaning this test can be used) for the duration of the COVID-19 declaration under Section 564(b)(1) of the Act, 21 U.S.C.section 360bbb-3(b)(1), unless the authorization is terminated  or revoked sooner.       Influenza A by PCR NEGATIVE NEGATIVE Final   Influenza B by PCR NEGATIVE NEGATIVE Final    Comment: (NOTE) The Xpert Xpress SARS-CoV-2/FLU/RSV plus assay is intended as an aid in the diagnosis of influenza from Nasopharyngeal swab specimens and should not be used as a sole basis for treatment. Nasal washings and aspirates are unacceptable for Xpert Xpress SARS-CoV-2/FLU/RSV testing.  Fact Sheet for Patients: EntrepreneurPulse.com.au  Fact Sheet for Healthcare Providers: IncredibleEmployment.be  This test is not yet approved or cleared by the [place] FDA and has been authorized for detection and/or diagnosis of SARS-CoV-2 by FDA under an Emergency Use Authorization (EUA). This EUA will remain in effect (meaning this test can be used)  for the duration of the COVID-19 declaration under Section 564(b)(1) of the Act, 21 U.S.C. section 360bbb-3(b)(1), unless the authorization is terminated or revoked.  Performed at [facility], [address] [place], [place] [address]      Signed: Terrilee Croak  [facility] 01/05/2021, 12:37 PM

## 2021-01-05 NOTE — Telephone Encounter (Signed)
Please schedule patient for lab/MD the week of 7/11 and notify pt of appt.

## 2021-01-05 NOTE — TOC Transition Note (Signed)
Transition of Care [facility]) - CM/SW Discharge Note   Patient Details  Name: Courtney Mack MRN: [id number] Date of Birth: [birth year]  Transition of Care [facility]) CM/SW Contact:  Shelbie Hutching, RN Phone Number: 01/05/2021, 1:27 PM   Clinical Narrative:    Patient medically cleared for discharge home with home health services today.  PT did recommend SNF but patient prefers to go home and thinks that she will be okay at home with her daughter's.  Patient will go home with home health services through [facility].  Sarah with [facility] aware of discharge today, she thinks that they can get someone out to see patient tomorrow.   3 in 1 ordered from [employer] and will be delivered to the patient's room before DC.  Patient will be going home on Lovenox injections, bedside RN has reviewed this with patient and daughter.   Patient's family will be picking her up today.    Final next level of care: [place] Barriers to Discharge: Barriers Resolved   Patient Goals and CMS Choice Patient states their goals for this hospitalization and ongoing recovery are:: For her legs to get better CMS Medicare.gov Compare Post Acute Care list provided to:: Patient Choice offered to / list presented to : Patient  Discharge Placement                       Discharge Plan and Services   Discharge Planning Services: CM Consult Post Acute Care Choice: Home Health          DME Arranged: 3-N-1 DME Agency: [facility] Date DME Agency Contacted: 01/05/21 Time DME Agency Contacted: [phone] Representative spoke with at DME Agency: [place]: RN, PT, OT [facility] Agency: [facility] Date [place]: 01/05/21 Time [place]: 1326 Representative spoke with at [place]: [place] Determinants of Health ([place]) Interventions     Readmission Risk Interventions No flowsheet data found.

## 2021-01-05 NOTE — Consult Note (Signed)
[place] for heparin infusion Indication: DVT and PE  Patient Measurements: Heparin Dosing Weight: 66 kg  Labs: Recent Labs    01/02/21 1051 01/02/21 1720 01/03/21 0202 01/03/21 0854 01/04/21 0021 01/04/21 0653 01/04/21 1442 01/04/21 2138 01/05/21 0555  HGB  --    < > 9.6*  --  9.4*  --   --   --  9.2*  HCT  --   --  30.2*  --  29.0*  --   --   --  27.8*  PLT  --   --  258  --  274  --   --   --  272  APTT 26  --   --   --   --   --   --   --   --   LABPROT 14.9  --   --   --   --   --   --   --   --   INR 1.2  --   --   --   --   --   --   --   --   HEPARINUNFRC <0.10*   < > 0.17*   < > 0.28*   < > 0.24* 0.50 0.45  CREATININE  --   --  0.85  --  1.02*  --   --   --   --    < > = values in this interval not displayed.    Estimated Creatinine Clearance: 55.5 mL/min (A) (by C-G formula based on SCr of 1.02 mg/dL (H)).   Medical History: Past Medical History:  Diagnosis Date   Diabetes mellitus without complication ([place])    DVT (deep venous thrombosis) ([place]) 09/06/2020   High cholesterol    Hx of blood clots    Hypertension    IDA (iron deficiency anemia) 09/21/2020    Assessment: Courtney Mack is a 65 YO female who presented to the ED on 01/02/21 with leg swelling and SOB. Doppler found thrombus throughout both femoral vein systems. Courtney Mack was on Xarelto PTA and had a dose evening 6/27. Patient is POD 1 thrombectomy by vascular.  Baseline aPTT 26s, INR 1.2, HL <0.10. Baseline CBC notable for mild anemia  6/28 1058 IV heparin 2000 unit bolus followed by continuous infusion at 1050 units/hr 6/28 1720 HL < 0.10 on 1050 units/hr 6/29 0202 HL 0.17, subtherapeutic on 1300 units/hr 6/29 0854 HL 0.24, subtherapeutic on 1500 units/hr 6/30 0021 HL 0.28, subtherapeutic 6/30 0653 HL 0.25, subtherapeutic 6/30 1442 HL 0.24. subtherapeutic 6/30 2138 HL 0.50, therapeutic x 1 7/01 0555 HL 0.45 therapeutic x 2  Goal of Therapy:  Heparin level 0.3-0.7  units/ml Monitor platelets by anticoagulation protocol: Yes   Plan:  -Continue heparin infusion rate at 2200 units/hr -Recheck HL with AM labs daily --Daily CBC per protocol while on IV heparin  Courtney Mack, PharmD, [facility] 01/05/2021 6:44 AM

## 2021-01-05 NOTE — TOC Benefit Eligibility Note (Signed)
Transition of Care [facility]) Benefit Eligibility Note    Patient Details  Name: Courtney Mack MRN: [id number] Date of Birth: 1956/06/09   Medication/Dose: LOVENOX  100 MG BID  SYRINGES  Covered?: Yes  Tier:  (TIER- 4 DRUG)  Prescription Coverage Preferred Pharmacy: Roseanne Kaufman with Person/Company/Phone Number:: TISTAN  @  [place] #  [phone]  Co-Pay: $89.08  Prior Approval: Yes [phone])  Deductible: Met (OUT-OF-POCKET:UNMET)  Additional Notes: ENOXAPARIN 100 MG BID SYRINGES   COVER- YES    CO-PAY- $100.00    TIER- 4 DRUG     P/A-NO    Memory Argue Phone Number: 01/05/2021, 4:14 PM

## 2021-01-05 NOTE — Telephone Encounter (Signed)
Sure! All is scheduled.

## 2021-01-09 LAB — CULTURE, BLOOD (ROUTINE X 2)
Culture: NO GROWTH
Culture: NO GROWTH
Special Requests: ADEQUATE

## 2021-01-10 ENCOUNTER — Encounter: Payer: Self-pay | Admitting: Oncology

## 2021-01-11 ENCOUNTER — Encounter: Payer: Self-pay | Admitting: Oncology

## 2021-01-11 ENCOUNTER — Other Ambulatory Visit (HOSPITAL_COMMUNITY): Payer: Self-pay

## 2021-01-11 DIAGNOSIS — D509 Iron deficiency anemia, unspecified: Secondary | ICD-10-CM | POA: Diagnosis not present

## 2021-01-11 DIAGNOSIS — I890 Lymphedema, not elsewhere classified: Secondary | ICD-10-CM | POA: Diagnosis not present

## 2021-01-11 DIAGNOSIS — Z6841 Body Mass Index (BMI) 40.0 and over, adult: Secondary | ICD-10-CM | POA: Diagnosis not present

## 2021-01-11 DIAGNOSIS — Z7901 Long term (current) use of anticoagulants: Secondary | ICD-10-CM | POA: Diagnosis not present

## 2021-01-11 DIAGNOSIS — I82413 Acute embolism and thrombosis of femoral vein, bilateral: Secondary | ICD-10-CM | POA: Diagnosis not present

## 2021-01-11 DIAGNOSIS — E119 Type 2 diabetes mellitus without complications: Secondary | ICD-10-CM | POA: Diagnosis not present

## 2021-01-11 DIAGNOSIS — I10 Essential (primary) hypertension: Secondary | ICD-10-CM | POA: Diagnosis not present

## 2021-01-11 DIAGNOSIS — E7800 Pure hypercholesterolemia, unspecified: Secondary | ICD-10-CM | POA: Diagnosis not present

## 2021-01-15 ENCOUNTER — Inpatient Hospital Stay: Payer: Medicare HMO | Attending: Oncology

## 2021-01-15 ENCOUNTER — Inpatient Hospital Stay (HOSPITAL_BASED_OUTPATIENT_CLINIC_OR_DEPARTMENT_OTHER): Payer: Medicare HMO | Admitting: Oncology

## 2021-01-15 ENCOUNTER — Encounter: Payer: Self-pay | Admitting: Oncology

## 2021-01-15 ENCOUNTER — Other Ambulatory Visit: Payer: Self-pay

## 2021-01-15 VITALS — BP 117/78 | HR 96 | Temp 97.6°F

## 2021-01-15 DIAGNOSIS — I10 Essential (primary) hypertension: Secondary | ICD-10-CM | POA: Insufficient documentation

## 2021-01-15 DIAGNOSIS — Z79899 Other long term (current) drug therapy: Secondary | ICD-10-CM | POA: Insufficient documentation

## 2021-01-15 DIAGNOSIS — C541 Malignant neoplasm of endometrium: Secondary | ICD-10-CM | POA: Diagnosis not present

## 2021-01-15 DIAGNOSIS — Z923 Personal history of irradiation: Secondary | ICD-10-CM | POA: Insufficient documentation

## 2021-01-15 DIAGNOSIS — E7800 Pure hypercholesterolemia, unspecified: Secondary | ICD-10-CM | POA: Diagnosis not present

## 2021-01-15 DIAGNOSIS — Z7901 Long term (current) use of anticoagulants: Secondary | ICD-10-CM | POA: Insufficient documentation

## 2021-01-15 DIAGNOSIS — E119 Type 2 diabetes mellitus without complications: Secondary | ICD-10-CM | POA: Insufficient documentation

## 2021-01-15 DIAGNOSIS — I82403 Acute embolism and thrombosis of unspecified deep veins of lower extremity, bilateral: Secondary | ICD-10-CM

## 2021-01-15 DIAGNOSIS — Z7984 Long term (current) use of oral hypoglycemic drugs: Secondary | ICD-10-CM | POA: Diagnosis not present

## 2021-01-15 DIAGNOSIS — Z6841 Body Mass Index (BMI) 40.0 and over, adult: Secondary | ICD-10-CM | POA: Insufficient documentation

## 2021-01-15 DIAGNOSIS — Z7951 Long term (current) use of inhaled steroids: Secondary | ICD-10-CM | POA: Insufficient documentation

## 2021-01-15 DIAGNOSIS — D500 Iron deficiency anemia secondary to blood loss (chronic): Secondary | ICD-10-CM

## 2021-01-15 DIAGNOSIS — Z87891 Personal history of nicotine dependence: Secondary | ICD-10-CM | POA: Insufficient documentation

## 2021-01-15 DIAGNOSIS — I2699 Other pulmonary embolism without acute cor pulmonale: Secondary | ICD-10-CM | POA: Diagnosis not present

## 2021-01-15 DIAGNOSIS — I82402 Acute embolism and thrombosis of unspecified deep veins of left lower extremity: Secondary | ICD-10-CM | POA: Insufficient documentation

## 2021-01-15 LAB — COMPREHENSIVE METABOLIC PANEL
ALT: 12 U/L (ref 0–44)
AST: 20 U/L (ref 15–41)
Albumin: 3.2 g/dL — ABNORMAL LOW (ref 3.5–5.0)
Alkaline Phosphatase: 57 U/L (ref 38–126)
Anion gap: 9 (ref 5–15)
BUN: 10 mg/dL (ref 8–23)
CO2: 26 mmol/L (ref 22–32)
Calcium: 9.3 mg/dL (ref 8.9–10.3)
Chloride: 101 mmol/L (ref 98–111)
Creatinine, Ser: 0.86 mg/dL (ref 0.44–1.00)
GFR, Estimated: >60 mL/min (ref >60)
Glucose, Bld: 122 mg/dL — ABNORMAL HIGH (ref 70–99)
Potassium: 4.3 mmol/L (ref 3.5–5.1)
Sodium: 136 mmol/L (ref 135–145)
Total Bilirubin: 0.6 mg/dL (ref 0.3–1.2)
Total Protein: 7.8 g/dL (ref 6.5–8.1)

## 2021-01-15 LAB — CBC WITH DIFFERENTIAL/PLATELET
Abs Immature Granulocytes: 0.03 10*3/uL (ref 0.00–0.07)
Basophils Absolute: 0.1 10*3/uL (ref 0.0–0.1)
Basophils Relative: 1 %
Eosinophils Absolute: 0.2 10*3/uL (ref 0.0–0.5)
Eosinophils Relative: 3 %
HCT: 34.4 % — ABNORMAL LOW (ref 36.0–46.0)
Hemoglobin: 10.7 g/dL — ABNORMAL LOW (ref 12.0–15.0)
Immature Granulocytes: 1 %
Lymphocytes Relative: 10 %
Lymphs Abs: 0.6 10*3/uL — ABNORMAL LOW (ref 0.7–4.0)
MCH: 28.8 pg (ref 26.0–34.0)
MCHC: 31.1 g/dL (ref 30.0–36.0)
MCV: 92.7 fL (ref 80.0–100.0)
Monocytes Absolute: 0.4 10*3/uL (ref 0.1–1.0)
Monocytes Relative: 6 %
Neutro Abs: 4.7 10*3/uL (ref 1.7–7.7)
Neutrophils Relative %: 79 %
Platelets: 351 10*3/uL (ref 150–400)
RBC: 3.71 MIL/uL — ABNORMAL LOW (ref 3.87–5.11)
RDW: 16.8 % — ABNORMAL HIGH (ref 11.5–15.5)
WBC: 5.9 10*3/uL (ref 4.0–10.5)
nRBC: 0 % (ref 0.0–0.2)

## 2021-01-15 LAB — MAGNESIUM: Magnesium: 1.7 mg/dL (ref 1.7–2.4)

## 2021-01-15 NOTE — Progress Notes (Signed)
Hematology/Oncology  Follow up note [facility] Telephone:([phone] Fax:([phone]) [phone]   Patient Care Team: [facility] as PCP - General (General Practice) Clent Jacks, RN as Oncology Nurse Navigator Earlie Server, MD as Consulting Physician (Hematology and Oncology)  REFERRING PROVIDER: [facility], [place] COMPLAINTS/REASON FOR VISIT:   endometrial cancer, iron deficiency anemia, recurrent thrombosis  HISTORY OF PRESENTING ILLNESS:   Courtney Mack is a  65 y.o.  female with PMH listed below was seen in consultation at the request of  Center, [place]*  for evaluation of endometrial cancer.  #February 2022, patient was hospitalized to due to left lower extremity DVT, extensive bilateral PE, status post embolectomy and IVC filter placement.  Patient was discharged on anticoagulation with Xarelto. Patient reports that she has had intermittent vaginal bleeding for the past few years.  After she was started on Xarelto, she started to experience frequent vaginal bleeding 09/06/2020, US pelvic complete showed a diffusely thickened and heterogeneous appearance of the endometrium, concerning for endometrial carcinoma.-  Uterus: Measurements: 10.4 x 5.2 x 5.9 cm = volume: 166 mL. No fibroids or other mass visualized. Endometrium Thickness: 29. The endometrium is diffusely heterogeneous and thickened with increased vascularity.Right and left ovaries: Not visualized.  No abnormal free fluid  Patient was evaluated by gynecology Dr. Amalia Hailey.  On physical examination, patient had malodorous friable necrotic appearing cervix was seen. Cervical biopsy performed. An endometrial biopsy was attempted but the cervical os was not well visualized.   # 09/06/2020 Cervical and endometrial biopsies A. UTERINE CERVIX; BIOPSY:   ENDOMETRIOID ENDOMETRIAL ADENOCARCINOMA, FAVOR LOW GRADE (FIGO GRADE 1-2).   B. ENDOMETRIUM; BIOPSY: - ENDOMETRIOID  ENDOMETRIAL ADENOCARCINOMA, FAVOR LOW GRADE (FIGO GRADE 1-2).  She presents for evaluation. She continues to have bleeding.  Patient was seen by gynecology oncology Dr. Theora Gianotti on 09/13/2020.  Patient is not a surgical candidate.  Refer to radiation oncology and my dog for further evaluation.  Patient presented to emergency room on 09/16/2020, 09/20/2020 for vaginal bleeding and passing clots.. 09/16/2020, hemoglobin 9.7, MCV 80 09/20/2020, hemoglobin 8.8, MCV 80.5. 08/24/2020, iron saturation 5, ferritin 12.  Today patient has been seen by radiation oncology.  PET scan has been scheduled on 09/28/2020. Due to the ongoing bleeding, decision was made to proceed with radiation on 09/25/2020. She reports very tired and fatigued.  Ongoing vaginal bleeding and passing blood clots..  Denies any chest pain, shortness of breath.Some left lower abdomen discomfort.  Chronic other medical problems include hypertension, morbid obesity, diabetes, chronic anticoagulation for recent bilateral extensive PE and a DVT.   INTERVAL HISTORY JAYDENCE Mack is a 65 y.o. female who has above history reviewed by me today presents for posthospitalization follow-up for recurrent thrombosis.  History of endometrial cancer status post radiation and brachytherapy. Problems and complaints are listed below: 01/02/2021 - 01/05/2021, patient was hospitalized due to worsening of bilateral lower extremity swelling.  She was found to have extensive occlusive thrombus throughout both femoral vein.  CT angiogram of the chest also showed small volume of chronic and nonocclusive PE. Patient was started on heparin drip.  Status post embolectomy by vascular surgeon.  Patient at that time claimed that she has been compliant with Xarelto.  After discussing with hospitalist, decision was made to switch patient to Lovenox injection after discharge.  Prescription was sent by hospitalist.  [employer] initially did not approve her Lovenox.  Patient  ended up restarted on Xarelto for  anticoagulation. Today patient was accompanied by her brother. Per patient and her brother, family realized that patient had ran out of Xarelto for about at least 2 weeks prior to the onset of her symptoms. With additional history, it is possible that patient developed a recurrent thrombosis due to not being on anticoagulation. She currently takes Xarelto 20 mg daily.  She reports that her primary care provider at [facility] will refill her medications in the future.  Review of Systems  Constitutional:  Negative for appetite change, chills, fatigue and fever.  HENT:   Negative for hearing loss and voice change.   Eyes:  Negative for eye problems.  Respiratory:  Negative for chest tightness and cough.   Cardiovascular:  Positive for leg swelling. Negative for chest pain.  Gastrointestinal:  Negative for abdominal distention, abdominal pain and blood in stool.  Endocrine: Negative for hot flashes.  Genitourinary:  Negative for difficulty urinating, frequency and vaginal bleeding.   Musculoskeletal:  Negative for arthralgias.  Skin:  Negative for itching and rash.  Neurological:  Negative for extremity weakness.  Hematological:  Negative for adenopathy.  Psychiatric/Behavioral:  Negative for confusion.      MEDICAL HISTORY:  Past Medical History:  Diagnosis Date   Diabetes mellitus without complication ([place])    DVT (deep venous thrombosis) ([place]) 09/06/2020   High cholesterol    Hx of blood clots    Hypertension    IDA (iron deficiency anemia) 09/21/2020    SURGICAL HISTORY: Past Surgical History:  Procedure Laterality Date   IVC FILTER INSERTION N/A 08/18/2020   Procedure: IVC FILTER INSERTION;  Surgeon: Katha Cabal, MD;  Location: [facility];  Service: Cardiovascular;  Laterality: N/A;   PERIPHERAL VASCULAR THROMBECTOMY Bilateral 01/03/2021   Procedure: PERIPHERAL VASCULAR THROMBECTOMY;  Surgeon: Algernon Huxley, MD;  Location: [facility];  Service: Cardiovascular;  Laterality: Bilateral;   PULMONARY THROMBECTOMY N/A 08/18/2020   Procedure: PULMONARY THROMBECTOMY;  Surgeon: Katha Cabal, MD;  Location: [facility];  Service: Cardiovascular;  Laterality: N/A;   TUBAL LIGATION      SOCIAL HISTORY: Social History   Socioeconomic History   Marital status: Single    Spouse name: Not on file   Number of children: Not on file   Years of education: Not on file   Highest education level: Not on file  Occupational History   Not on file  Tobacco Use   Smoking status: Former    Packs/day: 0.25    Years: 0.50    Pack years: 0.13    Types: Cigarettes    Quit date: 09/21/1980    Years since quitting: 40.3   Smokeless tobacco: Never   Tobacco comments:    smoked 2-4 cigarettes for about 2-3 weeks   Vaping Use   Vaping Use: Never used  Substance and Sexual Activity   Alcohol use: Not Currently   Drug use: Not Currently   Sexual activity: Not on file  Other Topics Concern   Not on file  Social History Narrative   Not on file   Social Determinants of Health   Financial Resource Strain: Not on file  Food Insecurity: Not on file  Transportation Needs: Not on file  Physical Activity: Not on file  Stress: Not on file  Social Connections: Not on file  Intimate Partner Violence: Not on file    FAMILY HISTORY: Family History  Problem Relation Age of Onset   Hypertension Mother  Diabetes Mother    Diabetes Father    Hypertension Father    High Cholesterol Father    Congestive Heart Failure Father     ALLERGIES:  has No Known Allergies.  MEDICATIONS:  Current Outpatient Medications  Medication Sig Dispense Refill   DULERA 100-5 MCG/ACT AERO Inhale 2 puffs into the lungs 2 (two) times daily.     losartan (COZAAR) 25 MG tablet Take 1 tablet (25 mg total) by mouth daily. 15 tablet 0   metFORMIN (GLUCOPHAGE) 500 MG tablet Take 1 tablet (500 mg total) by mouth 2  (two) times daily with a meal. 30 tablet 0   Multiple Vitamin (MULTIVITAMIN WITH MINERALS) TABS tablet Take 1 tablet by mouth daily.     ondansetron (ZOFRAN) 4 MG tablet Take 1 tablet (4 mg total) by mouth every 6 (six) hours as needed for nausea. 20 tablet 0   rivaroxaban (XARELTO) 20 MG TABS tablet Take 20 mg by mouth daily with supper.     rosuvastatin (CRESTOR) 10 MG tablet Take 1 tablet (10 mg total) by mouth daily. 15 tablet 0   enoxaparin (LOVENOX) 100 MG/ML injection Inject 1 mL (100 mg total) into the skin every 12 (twelve) hours. (Patient not taking: Reported on 01/15/2021) 60 mL 2   No current facility-administered medications for this visit.     PHYSICAL EXAMINATION: ECOG PERFORMANCE STATUS: 2 - Symptomatic, <50% confined to bed Vitals:   01/15/21 1029  BP: 117/78  Pulse: 96  Temp: 97.6 F (36.4 C)   There were no vitals filed for this visit.   Physical Exam Constitutional:      General: She is not in acute distress.    Appearance: She is obese.     Comments: Patient sits in the wheelchair  HENT:     Head: Normocephalic and atraumatic.  Eyes:     General: No scleral icterus. Cardiovascular:     Rate and Rhythm: Normal rate and regular rhythm.     Heart sounds: Normal heart sounds.  Pulmonary:     Effort: Pulmonary effort is normal. No respiratory distress.     Breath sounds: No wheezing.  Abdominal:     General: Bowel sounds are normal. There is no distension.     Palpations: Abdomen is soft.  Musculoskeletal:        General: Swelling present.     Cervical back: Normal range of motion and neck supple.     Comments: Lower extremities 3+ edema bilaterally  Skin:    General: Skin is warm and dry.     Coloration: Skin is not pale.     Findings: No erythema or rash.  Neurological:     Mental Status: She is alert and oriented to person, place, and time. Mental status is at baseline.     Cranial Nerves: No cranial nerve deficit.     Coordination: Coordination  normal.  Psychiatric:        Mood and Affect: Mood normal.    LABORATORY DATA:  I have reviewed the data as listed Lab Results  Component Value Date   WBC 5.9 01/15/2021   HGB 10.7 (L) 01/15/2021   HCT 34.4 (L) 01/15/2021   MCV 92.7 01/15/2021   PLT 351 01/15/2021   Recent Labs    09/25/20 0835 10/03/20 0835 10/27/20 1437 01/04/21 0021 01/05/21 0555 01/15/21 1010  NA 135 135   < > 136 135 136  K 4.1 4.1   < > 4.6 4.0 4.3  CL 99  103   < > 104 106 101  CO2 26 22   < > 26 25 26   GLUCOSE 155* 123*   < > 136* 156* 122*  BUN 14 10   < > 11 11 10   CREATININE 0.93 0.83   < > 1.02* 0.82 0.86  CALCIUM 9.1 8.7*   < > 8.1* 8.1* 9.3  GFRNONAA >60 >60   < > >60 >60 >60  PROT 7.1 6.9  --   --   --  7.8  ALBUMIN 3.2* 3.3*  --   --   --  3.2*  AST 178* 49*  --   --   --  20  ALT 195* 50*  --   --   --  12  ALKPHOS 40 34*  --   --   --  57  BILITOT 0.8 1.0  --   --   --  0.6   < > = values in this interval not displayed.    Iron/TIBC/Ferritin/ %Sat    Component Value Date/Time   IRON 17 (L) 08/24/2020 1311   TIBC 370 08/24/2020 1311   FERRITIN 12 08/24/2020 1311   IRONPCTSAT 5 (L) 08/24/2020 1311      RADIOGRAPHIC STUDIES: I have personally reviewed the radiological images as listed and agreed with the findings in the report. DG Chest 2 View  Result Date: 01/02/2021 CLINICAL DATA:  Shortness of breath. EXAM: CHEST - 2 VIEW COMPARISON:  10/27/2020.  PET-CT 09/28/2020.  CT chest 08/18/2020. FINDINGS: Mediastinum and hilar structures normal. Heart size normal. Questionable nodular opacity right lung base. Follow-up PA and lateral chest x-ray suggested for further evaluation. No pleural effusion or pneumothorax. Degenerative change thoracic spine. IMPRESSION: Questionable nodular opacity right lung base. Follow-up PA lateral chest x-ray suggested for further evaluation. Electronically Signed   By: Marcello Moores  Register   On: 01/02/2021 06:05   CT Angio Chest PE W/Cm &/Or Wo Cm  Result  Date: 01/02/2021 CLINICAL DATA:  65 year old female with bilateral lower extremity edema and shortness of breath. Endometrial cancer. Bilateral pulmonary emboli in February this year. EXAM: CT ANGIOGRAPHY CHEST WITH CONTRAST TECHNIQUE: Multidetector CT imaging of the chest was performed using the standard protocol during bolus administration of intravenous contrast. Multiplanar CT image reconstructions and MIPs were obtained to evaluate the vascular anatomy. CONTRAST:  180mL OMNIPAQUE IOHEXOL 350 MG/ML SOLN COMPARISON:  PET-CT 09/28/2020 chest CTA 08/18/2020. FINDINGS: Cardiovascular: Good contrast bolus timing in the pulmonary arterial tree. Clearance of the saddle embolus demonstrated in February. Very small volume of chronic PE suspected at the arborization of the right lower lung pulmonary arteries (series 5, images 130 through 146. Similar nonocclusive thrombus adherent to the wall in the right lower lobe segmental branch on image 169, and that clot also bifurcates distally. Those arteries were completely opacified in February and now appear patent. No pulmonary artery occlusion or convincing acute pulmonary embolus identified today. Cardiac size within normal limits. No pericardial effusion. Negative visible aorta aside from mild calcified atherosclerosis. No definite calcified coronary artery plaque. Mediastinum/Nodes: No mediastinal lymphadenopathy. Lungs/Pleura: Major airways are patent with mild upper lobe mosaic attenuation in both lungs. Patchy additional distal peribronchial opacity in the anterior inferior upper lobe, most resembles atelectasis. No pleural effusion or consolidation. Upper Abdomen: Negative visible liver, gallbladder, spleen, pancreas, adrenal glands, kidneys, and bowel in the upper abdomen. Musculoskeletal: No acute or suspicious osseous lesion identified. Dependent body wall edema is increased compared to March. Review of the MIP images confirms the  above findings. IMPRESSION: 1.  Small volume of chronic and nonocclusive PE remains in some of the right lower lobe branches, with clearance of the saddle embolus seen in February, and no convincing acute pulmonary embolus today. 2. Mild bilateral upper lobe gas trapping and atelectasis. 3. No metastatic disease identified in the chest. Electronically Signed   By: Genevie Ann M.D.   On: 01/02/2021 07:50   PERIPHERAL VASCULAR CATHETERIZATION  Result Date: 01/03/2021 See surgical note for result.  US Venous Img Lower Bilateral (DVT)  Result Date: 01/02/2021 CLINICAL DATA:  Bilateral lower extremity swelling. EXAM: BILATERAL LOWER EXTREMITY VENOUS DOPPLER ULTRASOUND TECHNIQUE: Gray-scale sonography with compression, as well as color and duplex ultrasound, were performed to evaluate the deep venous system(s) from the level of the common femoral vein through the popliteal and proximal calf veins. COMPARISON:  Ultrasound 08/19/2020 and 08/18/2020 FINDINGS: VENOUS This was a suboptimal exam due to patient's body habitus and pain. Patient would not allow evaluation of the popliteal veins and calf veins due to pain. Extensive occlusive thrombus noted throughout both the femoral venous systems. No other focal abnormality identified. OTHER None. IMPRESSION: This is a suboptimal exam due to patient's body habitus and pain. Patient would not allow evaluation of the popliteal veins and calf veins due to pain. Extensive occlusive thrombus noted throughout both femoral venous systems. Critical Value/emergent results were called by telephone at the time of interpretation on 01/02/2021 at 8:17 am to provider Dr. Joni Fears , who verbally acknowledged these results. Electronically Signed   By: Marcello Moores  Register   On: 01/02/2021 08:20       ASSESSMENT & PLAN:  1. Endometrial cancer ([place])   2. Iron deficiency anemia due to chronic blood loss   3. Recurrent acute deep vein thrombosis (DVT) of both lower extremities (HCC)    #Stage II endometrial cancer with  cervical involvement or cervical cancer.  Not a candidate of surgery and chemotherapy.  Status post radiation and brachytherapy. No vaginal bleeding.  She has an appointment with gynecology oncology for follow-up and pelvic examination.  There was foci of anal rectal hypermetabolic area which were indeterminate.  No CT correlate.  She will need a colonoscopy in the future.     #Recurrent extensive lower extremity thrombosis, small volume pulmonary embolism.  Likely due to being off anticoagulation-Xarelto. Recommend patient to continue Xarelto 20 mg daily. I offered her to refill Xarelto and the patient states that her primary care provider has agreed to take care of her Xarelto refills.  Recommend patient to make sure she has adequate refills so that she does not miss any additional doses.  #Iron deficiency anemia, hemoglobin has improved to 10.7.  Previously has received multiple doses of IV Venofer treatments.  Continue monitor.   All questions were answered. The patient knows to call the clinic with any problems questions or concerns.   Return of visit: 6 months   Earlie Server, MD, PhD Hematology Oncology [facility] Pager- [phone] 01/15/2021

## 2021-01-15 NOTE — Progress Notes (Signed)
Patient had recent hospitalization with bilateral LE DVT.  She thinks the DVTs could be due to being out of Xarelto 2 weeks prior.  Insurance did not approve the Lovenox so she is back on the Xarelto now since discharged from hospital.

## 2021-01-17 ENCOUNTER — Other Ambulatory Visit: Payer: Self-pay

## 2021-01-17 ENCOUNTER — Ambulatory Visit
Admission: RE | Admit: 2021-01-17 | Discharge: 2021-01-17 | Disposition: A | Discharge status: Home / Self Care | Payer: Medicare HMO | Source: Ambulatory Visit | Attending: Radiation Oncology | Admitting: Radiation Oncology

## 2021-01-17 ENCOUNTER — Inpatient Hospital Stay (HOSPITAL_BASED_OUTPATIENT_CLINIC_OR_DEPARTMENT_OTHER): Payer: Medicare HMO | Admitting: Obstetrics and Gynecology

## 2021-01-17 VITALS — BP 117/73 | HR 99 | Temp 98.0°F | Resp 20

## 2021-01-17 DIAGNOSIS — R609 Edema, unspecified: Secondary | ICD-10-CM | POA: Diagnosis not present

## 2021-01-17 DIAGNOSIS — C541 Malignant neoplasm of endometrium: Secondary | ICD-10-CM | POA: Insufficient documentation

## 2021-01-17 DIAGNOSIS — I2699 Other pulmonary embolism without acute cor pulmonale: Secondary | ICD-10-CM | POA: Diagnosis not present

## 2021-01-17 DIAGNOSIS — Z6841 Body Mass Index (BMI) 40.0 and over, adult: Secondary | ICD-10-CM | POA: Diagnosis not present

## 2021-01-17 DIAGNOSIS — D500 Iron deficiency anemia secondary to blood loss (chronic): Secondary | ICD-10-CM | POA: Diagnosis not present

## 2021-01-17 DIAGNOSIS — Z923 Personal history of irradiation: Secondary | ICD-10-CM | POA: Diagnosis not present

## 2021-01-17 DIAGNOSIS — I10 Essential (primary) hypertension: Secondary | ICD-10-CM | POA: Diagnosis not present

## 2021-01-17 DIAGNOSIS — I82402 Acute embolism and thrombosis of unspecified deep veins of left lower extremity: Secondary | ICD-10-CM | POA: Diagnosis not present

## 2021-01-17 DIAGNOSIS — Z7901 Long term (current) use of anticoagulants: Secondary | ICD-10-CM | POA: Diagnosis not present

## 2021-01-17 NOTE — Progress Notes (Signed)
Radiation Oncology Follow up Note  Name: Courtney Mack   Date:   01/17/2021 MRN:  [id number] DOB: Apr 20, 1956    This 65 y.o. female presents to the clinic today for 1 month follow-up status post external beam radiation therapy to her pelvis for.  At least stage II FIGO grade 1-2 endometrial carcinoma with cervical involvement and bleeding status post whole pelvic radiation as well as brachytherapy at [facility].  REFERRING PROVIDER: Gladstone Lighter, MD  HPI: Patient is a 65 year old female now at 1 month having completed whole pelvic radiation as well as brachytherapy at [facility] for at least stage II FIGO grade 1-2 endometrial adenocarcinoma and nonsurgical candidate.  She is seen today in routine follow-up.  From a GYN standpoint she is doing well no vaginal discharge no bleeding no abdominal pain or discomfort no increased lower urinary tract symptoms or diarrhea.  She does have recurrent extensive lower extremity thrombosis and a small volume pulmonary embolus and patient had stopped her Xarelto.  She has significant bilateral lower extremity edema.  She is seeing a vein specialist in the next 2 weeks.  COMPLICATIONS OF TREATMENT: none  FOLLOW UP COMPLIANCE: keeps appointments   PHYSICAL EXAM:  There were no vitals taken for this visit. Patient has extensive lower extremity edema.  Well-developed well-nourished patient in NAD. HEENT reveals PERLA, EOMI, discs not visualized.  Oral cavity is clear. No oral mucosal lesions are identified. Neck is clear without evidence of cervical or supraclavicular adenopathy. Lungs are clear to A&P. Cardiac examination is essentially unremarkable with regular rate and rhythm without murmur rub or thrill. Abdomen is benign with no organomegaly or masses noted. Motor sensory and DTR levels are equal and symmetric in the upper and lower extremities. Cranial nerves II through XII are grossly intact. Proprioception is intact. No peripheral adenopathy or edema is  identified. No motor or sensory levels are noted. Crude visual fields are within normal range.  RADIOLOGY RESULTS: No current films for review  PLAN: Present time from a GYN standpoint she is doing fine no significant side effects or complaints.  Major problem at this point is her lower extremity edema secondary to lower extremity thrombosis.  I have asked to see her back in 4 to 5 months for follow-up.  She already has follow-up appointments with GYN oncology and at [facility] for follow-up.  She is also seeing a vein specialist for her lower extremity edema.  Patient knows to call with any concerns.  I would like to take this opportunity to thank you for allowing me to participate in the care of your patient.Noreene Filbert, MD

## 2021-01-17 NOTE — Progress Notes (Signed)
Gynecologic Oncology Consult Visit   Referring Provider: Gladstone Lighter, MD [place],  [place] [address] [phone]   Chief Concern: Stage II endometrial cancer  Subjective:  GENNAVIEVE HUQ is a 65 y.o. G2P2 female who is seen in consultation from Dr. Tressia Miners for cervical/endometrial cancer. She was last seen in clinic 09/13/2020 for initial evaluation.   She was diagnosed with recent DVT, PE s/p embolectomy and IVC filter placement discharged on Xarelto presented to ED w/ vaginal bleeding. Pelvic U/S worrisome for endometrial carcinoma. Admitted for bleeding on anticoagulation. She received PRBC x 1 unit. Gyn Oncology consult was obtained.   She was discussed at tumor board on 10/18/2020.  She is currently receiving external pelvic radiation for ongoing vaginal bleeding. Patient felt to not be a candidate for surgery nor chemotherapy. The recommendation was to continue external pelvic radiation then refer patient to Dr. Christel Mormon at [facility] for vaginal brachytherapy given complexity.   She is now taking Xarelto 20mg  daily for her history of clots. Legs very swollen due to this and refuses pelvic exam today.  No bleeding reported. She is in a wheelchair.   Oncology History 09/06/2020 Pelvic US FINDINGS: Uterus: Measurements: 10.4 x 5.2 x 5.9 cm = volume: 166 mL. No fibroids or other mass visualized. Endometrium Thickness: 29. The endometrium is diffusely heterogeneous and thickened with increased vascularity. Right and left ovaries: Not visualized.  No abnormal free fluid.   IMPRESSION: 1. Diffusely thickened and heterogeneous appearance of the endometrium is concerning for endometrial carcinoma. Gynecologic follow-up is recommended. 2. Neither ovary was visualized.  Dr. Amalia Hailey was consulted for gynecologic evaluation. On speculum examination malodorous friable necrotic appearing cervix was seen. Cervical biopsy performed. An endometrial biopsy was attempted but the  cervical os was not well visualized. Tissue in the [place] was sent for evaluation.  09/06/2020 Cervical and endometrial biopsies A. UTERINE CERVIX; BIOPSY:   ENDOMETRIOID ENDOMETRIAL ADENOCARCINOMA, FAVOR LOW GRADE (FIGO GRADE 1-2).   B. ENDOMETRIUM; BIOPSY: - ENDOMETRIOID ENDOMETRIAL ADENOCARCINOMA, FAVOR LOW GRADE (FIGO GRADE 1-2).   She was not thought to be a good surgical candidate due to medical comorbidities.  Under went pelvic radiation at [facility] followed by brachythereapy at [facility] in 6/22.   Problem List: Patient Active Problem List   Diagnosis Date Noted   Acute bilateral deep vein thrombosis (DVT) of femoral veins (HCC) 01/02/2021   IDA (iron deficiency anemia) 09/21/2020   Endometrial cancer ([place]) 09/13/2020   Acute blood loss anemia 09/06/2020   Vaginal bleeding 09/06/2020   DVT (deep venous thrombosis) ([place]) 09/06/2020   Anemia, unspecified 08/24/2020   Morbidly obese ([place]) 08/23/2020   Essential hypertension 08/23/2020   Diabetes mellitus without complication ([place]) 78/58/8502   Dyslipidemia 08/23/2020   Acute respiratory failure with hypoxia (HCC)    Pulmonary embolism ([place]) 08/18/2020   Vaginal bleeding, abnormal 08/18/2020   Obesity, morbid, BMI 50 or higher ([place]) 08/18/2020    Past Medical History: Past Medical History:  Diagnosis Date   Diabetes mellitus without complication ([place])    DVT (deep venous thrombosis) ([place]) 09/06/2020   High cholesterol    Hx of blood clots    Hypertension    IDA (iron deficiency anemia) 09/21/2020    Past Surgical History: Past Surgical History:  Procedure Laterality Date   IVC FILTER INSERTION N/A 08/18/2020   Procedure: IVC FILTER INSERTION;  Surgeon: Katha Cabal, MD;  Location: [place] [facility];  Service: Cardiovascular;  Laterality: N/A;   PERIPHERAL VASCULAR THROMBECTOMY Bilateral 01/03/2021  Procedure: PERIPHERAL VASCULAR THROMBECTOMY;  Surgeon: Algernon Huxley, MD;  Location: [place] [facility];  Service:  Cardiovascular;  Laterality: Bilateral;   PULMONARY THROMBECTOMY N/A 08/18/2020   Procedure: PULMONARY THROMBECTOMY;  Surgeon: Katha Cabal, MD;  Location: [place] [facility];  Service: Cardiovascular;  Laterality: N/A;   TUBAL LIGATION      Past Gynecologic History:  Menarche: 9 Menstrual details: Lasted for 5 days Last Menstrual Period: unknown  OB History:  OB History  Gravida Para Term Preterm AB Living  2 2       2   SAB IAB Ectopic Multiple Live Births               # Outcome Date GA Lbr Len/2nd Weight Sex Delivery Anes PTL Lv  2 Para           1 Para             Family History: Family History  Problem Relation Age of Onset   Hypertension Mother    Diabetes Mother    Diabetes Father    Hypertension Father    High Cholesterol Father    Congestive Heart Failure Father     Social History: Social History   Socioeconomic History   Marital status: Single    Spouse name: Not on file   Number of children: Not on file   Years of education: Not on file   Highest education level: Not on file  Occupational History   Not on file  Tobacco Use   Smoking status: Former    Packs/day: 0.25    Years: 0.50    Pack years: 0.13    Types: Cigarettes    Quit date: 09/21/1980    Years since quitting: 40.3   Smokeless tobacco: Never   Tobacco comments:    smoked 2-4 cigarettes for about 2-3 weeks   Vaping Use   Vaping Use: Never used  Substance and Sexual Activity   Alcohol use: Not Currently   Drug use: Not Currently   Sexual activity: Not on file  Other Topics Concern   Not on file  Social History Narrative   Not on file   Social Determinants of Health   Financial Resource Strain: Not on file  Food Insecurity: Not on file  Transportation Needs: Not on file  Physical Activity: Not on file  Stress: Not on file  Social Connections: Not on file  Intimate Partner Violence: Not on file    Allergies: No Known Allergies  Current Medications: Current  Outpatient Medications  Medication Sig Dispense Refill   DULERA 100-5 MCG/ACT AERO Inhale 2 puffs into the lungs 2 (two) times daily.     enoxaparin (LOVENOX) 100 MG/ML injection Inject 1 mL (100 mg total) into the skin every 12 (twelve) hours. (Patient not taking: Reported on 01/15/2021) 60 mL 2   losartan (COZAAR) 25 MG tablet Take 1 tablet (25 mg total) by mouth daily. 15 tablet 0   metFORMIN (GLUCOPHAGE) 500 MG tablet Take 1 tablet (500 mg total) by mouth 2 (two) times daily with a meal. 30 tablet 0   Multiple Vitamin (MULTIVITAMIN WITH MINERALS) TABS tablet Take 1 tablet by mouth daily.     ondansetron (ZOFRAN) 4 MG tablet Take 1 tablet (4 mg total) by mouth every 6 (six) hours as needed for nausea. 20 tablet 0   rivaroxaban (XARELTO) 20 MG TABS tablet Take 20 mg by mouth daily with supper.     rosuvastatin (CRESTOR) 10 MG tablet  Take 1 tablet (10 mg total) by mouth daily. 15 tablet 0   No current facility-administered medications for this visit.    Review of Systems Review of Systems  Constitutional:  Positive for malaise/fatigue. Negative for fever and weight loss.  HENT:  Negative for congestion and hearing loss.   Eyes:  Negative for blurred vision and double vision.  Respiratory:  Negative for cough.   Cardiovascular:  Negative for chest pain and palpitations.  Gastrointestinal:  Negative for abdominal pain, constipation, diarrhea, nausea and vomiting.  Genitourinary:  Negative for frequency and urgency.  Skin:  Negative for rash.  Neurological:  Negative for dizziness, tingling and headaches.  Endo/Heme/Allergies:  Does not bruise/bleed easily.  Psychiatric/Behavioral:  Negative for depression. The patient is not nervous/anxious and does not have insomnia.     Objective:  Physical Examination:  BP 117/73   Pulse 99   Temp 98 F (36.7 C)   Resp 20   SpO2 97%    ECOG Performance Status: 4 - Bedbound  Physical Exam Vitals and nursing note reviewed. Chaperone present:  In wheelchair; Brother present during encounter.  Constitutional:      Appearance: She is obese.  HENT:     Head: Normocephalic and atraumatic.     Nose: No congestion.     Mouth/Throat:     Mouth: Mucous membranes are moist.     Pharynx: Oropharynx is clear.  Eyes:     General:        Right eye: No discharge.        Left eye: No discharge.     Extraocular Movements: Extraocular movements intact.     Pupils: Pupils are equal, round, and reactive to light.     Comments: Glasses  Cardiovascular:     Rate and Rhythm: Normal rate and regular rhythm.     Pulses: Normal pulses.     Heart sounds: No murmur heard. Pulmonary:     Effort: Pulmonary effort is normal.  Abdominal:     General: Bowel sounds are normal. There is no distension.     Tenderness: There is no abdominal tenderness. There is no guarding.  Genitourinary:    Comments: Patient declined pelvic exam.  Musculoskeletal:        General: Swelling present. No deformity.     Right lower leg: 4+ Edema present.     Left lower leg: 4+ Edema present.  Skin:    General: Skin is dry.     Capillary Refill: Capillary refill takes less than 2 seconds.     Coloration: Skin is pale.  Neurological:     Mental Status: She is alert and oriented to person, place, and time.  Psychiatric:        Mood and Affect: Mood normal.        Thought Content: Thought content normal.    Pelvic: Patient declined.    Lab Review Labs on site today: Lab Results  Component Value Date   WBC 5.9 01/15/2021   HGB 10.7 (L) 01/15/2021   HCT 34.4 (L) 01/15/2021   MCV 92.7 01/15/2021   PLT 351 01/15/2021     Chemistry      Component Value Date/Time   NA 136 01/15/2021 1010   K 4.3 01/15/2021 1010   CL 101 01/15/2021 1010   CO2 26 01/15/2021 1010   BUN 10 01/15/2021 1010   CREATININE 0.86 01/15/2021 1010      Component Value Date/Time   CALCIUM 9.3 01/15/2021 1010   ALKPHOS  57 01/15/2021 1010   AST 20 01/15/2021 1010   ALT 12 01/15/2021  1010   BILITOT 0.6 01/15/2021 1010      Radiologic Imaging: 08/18/2020 CXR    FINDINGS: The heart size and mediastinal contours are within normal limits. Both lungs are clear. The visualized skeletal structures are unremarkable. Mild elevation of the right hemidiaphragm.   IMPRESSION: 1. No acute cardiopulmonary disease.  08/18/2020  Chest CT IMPRESSION: 1. Extensive bilateral pulmonary emboli as detailed above. Positive for acute PE with CTevidence of right heart strain (RV/LV Ratio = 1.6) consistent with at least submassive (intermediate risk) PE. The presence of right heart strain has been associated with an increased risk of morbidity and mortality. Critical Value/emergent results were called by telephone at the time of interpretation on 08/18/2020 at 3:11 pm to provider [facility] , who verbally acknowledged these results. 2. No other acute abnormality. 3. Mild aortic atherosclerosis.  LEFT LOWER EXTREMITY VENOUS DOPPLER ULTRASOUND IMPRESSION: Findings consistent with nonocclusive DVT within the LEFT popliteal, LEFT posterior tibial and LEFT peroneal veins.   RIGHT LOWER EXTREMITY VENOUS DOPPLER ULTRASOUND   IMPRESSION: No evidence for DVT involving the right lower extremity. Please see prior left lower extremity report for further details of the left lower extremity. Assessment:  MARGURETTE BRENER is a 65 y.o. female diagnosed with stage II grade 1-2 endometrioid endometrial cancer with cervical involvement diagnosed 2/22.  Due to poor PS was treated with radiation including external pelvic RT at [facility] and Brachytherapy at [facility] with Dr Christel Mormon.  No bleeding now.   DVTs/PE on Xarelto.  Legs very swollen.  Poor performance status.   Medical co-morbidities complicating care: HTN, diabetes and obesity. Massive PE with LE DVTs currently on anti-coagulation.   Plan:   Problem List Items Addressed This Visit       Genitourinary   Endometrial cancer ([place]) - Primary   Lack  of bleeding suggests good response to radiation.  She will see Dr Christel Mormon next month with PET/CT.    Continue Xarelto for DVTs/PE.  Seeing PT, but warned her not to do compression therapy with active clots in the legs.   Follow up in three months.    Benedetto Goad, Student FNP  The patient's diagnosis, an outline of the further diagnostic and laboratory studies which will be required, the recommendation for surgery, and alternatives were discussed with her and her accompanying family members.  All questions were answered to their satisfaction.  I personally had a face to face interaction and evaluated the patient jointly with the NP Student, Mrs. Benedetto Goad.  I have reviewed her history and available records and have performed the key portions of the physical exam including general, HEENT, abdominal exam with my findings confirming those documented above by the APP student.  I have discussed the case with the APP student and the patient.  I agree with the above documentation, assessment and plan which was fully formulated by me.  Counseling was completed by me.   Mellody Drown, MD   CC:  Gladstone Lighter, MD [address] [place],  [place] [id number] [phone]

## 2021-01-19 ENCOUNTER — Other Ambulatory Visit (INDEPENDENT_AMBULATORY_CARE_PROVIDER_SITE_OTHER): Payer: Self-pay | Admitting: Vascular Surgery

## 2021-01-19 DIAGNOSIS — Z95828 Presence of other vascular implants and grafts: Secondary | ICD-10-CM

## 2021-01-19 DIAGNOSIS — I82423 Acute embolism and thrombosis of iliac vein, bilateral: Secondary | ICD-10-CM

## 2021-01-19 DIAGNOSIS — I82413 Acute embolism and thrombosis of femoral vein, bilateral: Secondary | ICD-10-CM

## 2021-01-22 ENCOUNTER — Ambulatory Visit (INDEPENDENT_AMBULATORY_CARE_PROVIDER_SITE_OTHER): Payer: Medicare HMO | Admitting: Nurse Practitioner

## 2021-01-22 ENCOUNTER — Ambulatory Visit (INDEPENDENT_AMBULATORY_CARE_PROVIDER_SITE_OTHER): Payer: Medicare HMO

## 2021-01-22 ENCOUNTER — Encounter (INDEPENDENT_AMBULATORY_CARE_PROVIDER_SITE_OTHER): Payer: Self-pay | Admitting: Nurse Practitioner

## 2021-01-22 ENCOUNTER — Other Ambulatory Visit: Payer: Self-pay

## 2021-01-22 VITALS — BP 133/84 | HR 90 | Ht 59.0 in | Wt 230.0 lb

## 2021-01-22 DIAGNOSIS — I82413 Acute embolism and thrombosis of femoral vein, bilateral: Secondary | ICD-10-CM | POA: Diagnosis not present

## 2021-01-22 DIAGNOSIS — Z95828 Presence of other vascular implants and grafts: Secondary | ICD-10-CM | POA: Diagnosis not present

## 2021-01-22 DIAGNOSIS — I82423 Acute embolism and thrombosis of iliac vein, bilateral: Secondary | ICD-10-CM

## 2021-01-22 DIAGNOSIS — I10 Essential (primary) hypertension: Secondary | ICD-10-CM | POA: Diagnosis not present

## 2021-01-22 DIAGNOSIS — E785 Hyperlipidemia, unspecified: Secondary | ICD-10-CM | POA: Diagnosis not present

## 2021-01-22 NOTE — Progress Notes (Signed)
Subjective:    Patient ID: Courtney Mack, female    DOB: [birth year], 65 y.o.   MRN: [id number] Chief Complaint  Patient presents with   Follow-up    [facility] 1wk FU willl need BIL LE DVT    Courtney Mack is a 65 year old female that presents today for follow-up evaluation after bilateral lower extremity thrombectomy on 01/03/2021.  The patient presented to the emergency room with pain and discomfort of her bilateral lower extremities.  She had a previous history of known lymphedema but was also undergoing treatment for endometrial cancer.  The patient was previously on anticoagulation due to a DVT in March 2022 of this year.  Initially it was thought that the patient was compliant with medication however upon further evaluation it was noted that the patient had not taken her anticoagulation for 2-1/2 weeks prior to presenting to the emergency room.  Today she denies any fevers or chills or open wounds or ulcerations however she is having issues with lower extremity edema.  The patient is currently done with treatment for endometrial cancer and they believe that she is currently clear.   Review of Systems  Cardiovascular:  Positive for leg swelling.  Neurological:  Positive for weakness.  All other systems reviewed and are negative.     Objective:   Physical Exam Vitals reviewed.  HENT:     Head: Normocephalic.  Cardiovascular:     Rate and Rhythm: Normal rate.  Pulmonary:     Effort: Pulmonary effort is normal.  Musculoskeletal:     Right lower leg: 3+ Edema present.     Left lower leg: 3+ Edema present.  Neurological:     Mental Status: She is alert and oriented to person, place, and time. Mental status is at baseline.  Psychiatric:        Mood and Affect: Mood normal.        Behavior: Behavior normal.        Thought Content: Thought content normal.        Judgment: Judgment normal.    BP 133/84   Pulse 90   Ht 4\' 11"  (1.499 m)   Wt 230 lb (104.3 kg)   BMI 46.45 kg/m    Past Medical History:  Diagnosis Date   Diabetes mellitus without complication (HCC)    DVT (deep venous thrombosis) ([place]) 09/06/2020   High cholesterol    Hx of blood clots    Hypertension    IDA (iron deficiency anemia) 09/21/2020    Social History   Socioeconomic History   Marital status: Single    Spouse name: Not on file   Number of children: Not on file   Years of education: Not on file   Highest education level: Not on file  Occupational History   Not on file  Tobacco Use   Smoking status: Former    Packs/day: 0.25    Years: 0.50    Pack years: 0.13    Types: Cigarettes    Quit date: 09/21/1980    Years since quitting: 40.3   Smokeless tobacco: Never   Tobacco comments:    smoked 2-4 cigarettes for about 2-3 weeks   Vaping Use   Vaping Use: Never used  Substance and Sexual Activity   Alcohol use: Not Currently   Drug use: Not Currently   Sexual activity: Not on file  Other Topics Concern   Not on file  Social History Narrative   Not on file   Social Determinants of Health  Financial Resource Strain: Not on file  Food Insecurity: Not on file  Transportation Needs: Not on file  Physical Activity: Not on file  Stress: Not on file  Social Connections: Not on file  Intimate Partner Violence: Not on file    Past Surgical History:  Procedure Laterality Date   IVC FILTER INSERTION N/A 08/18/2020   Procedure: IVC FILTER INSERTION;  Surgeon: Katha Cabal, MD;  Location: [place] [facility];  Service: Cardiovascular;  Laterality: N/A;   PERIPHERAL VASCULAR THROMBECTOMY Bilateral 01/03/2021   Procedure: PERIPHERAL VASCULAR THROMBECTOMY;  Surgeon: Algernon Huxley, MD;  Location: [place] [facility];  Service: Cardiovascular;  Laterality: Bilateral;   PULMONARY THROMBECTOMY N/A 08/18/2020   Procedure: PULMONARY THROMBECTOMY;  Surgeon: Katha Cabal, MD;  Location: [place] [facility];  Service: Cardiovascular;  Laterality: N/A;   TUBAL LIGATION       Family History  Problem Relation Age of Onset   Hypertension Mother    Diabetes Mother    Diabetes Father    Hypertension Father    High Cholesterol Father    Congestive Heart Failure Father     No Known Allergies  CBC Latest Ref Rng & Units 01/15/2021 01/05/2021 01/04/2021  WBC 4.0 - 10.5 K/uL 5.9 6.0 5.2  Hemoglobin 12.0 - 15.0 g/dL 10.7(L) 9.2(L) 9.4(L)  Hematocrit 36.0 - 46.0 % 34.4(L) 27.8(L) 29.0(L)  Platelets 150 - 400 K/uL 351 272 274      CMP     Component Value Date/Time   NA 136 01/15/2021 1010   K 4.3 01/15/2021 1010   CL 101 01/15/2021 1010   CO2 26 01/15/2021 1010   GLUCOSE 122 (H) 01/15/2021 1010   BUN 10 01/15/2021 1010   CREATININE 0.86 01/15/2021 1010   CALCIUM 9.3 01/15/2021 1010   PROT 7.8 01/15/2021 1010   ALBUMIN 3.2 (L) 01/15/2021 1010   AST 20 01/15/2021 1010   ALT 12 01/15/2021 1010   ALKPHOS 57 01/15/2021 1010   BILITOT 0.6 01/15/2021 1010   GFRNONAA >60 01/15/2021 1010     No results found.     Assessment & Plan:   1. Acute bilateral deep vein thrombosis (DVT) of femoral veins (HCC) Noninvasive studies today show progression and resolution of her DVTs.  The thrombus is not acute but not quite chronic at this time.  There is also evidence of recannulization.  Unfortunately the patient's biggest complaint is edema bilaterally.  I discussed with the patient the best course of treatment is compression elevation and activity if tolerable.  We suggested Unna wraps for the patient however she was told by the cancer center that she should not wear compression.  I have advised the patient that typically it is safe to utilize compression approximately 1 week after the initial DVT, and currently the patient is 3 weeks out.  The patient is still not comfortable with utilizing compression so we will have her return to the office in 3 weeks for reevaluation and possible unnawrap placement.  Currently no changes to medication.  It is also noted that her  DVTs were not a result of failure of anticoagulation but that the patient did not take it for 2-1/2 months.  2. Essential hypertension Continue antihypertensive medications as already ordered, these medications have been reviewed and there are no changes at this time.   3. Dyslipidemia Continue statin as ordered and reviewed, no changes at this time    Current Outpatient Medications on File Prior to Visit  Medication Sig  Dispense Refill   DULERA 100-5 MCG/ACT AERO Inhale 2 puffs into the lungs 2 (two) times daily.     enoxaparin (LOVENOX) 100 MG/ML injection Inject 1 mL (100 mg total) into the skin every 12 (twelve) hours. 60 mL 2   losartan (COZAAR) 25 MG tablet Take 1 tablet (25 mg total) by mouth daily. 15 tablet 0   metFORMIN (GLUCOPHAGE) 500 MG tablet Take 1 tablet (500 mg total) by mouth 2 (two) times daily with a meal. 30 tablet 0   Multiple Vitamin (MULTIVITAMIN WITH MINERALS) TABS tablet Take 1 tablet by mouth daily.     ondansetron (ZOFRAN) 4 MG tablet Take 1 tablet (4 mg total) by mouth every 6 (six) hours as needed for nausea. 20 tablet 0   rivaroxaban (XARELTO) 20 MG TABS tablet Take 20 mg by mouth daily with supper.     rosuvastatin (CRESTOR) 10 MG tablet Take 1 tablet (10 mg total) by mouth daily. 15 tablet 0   No current facility-administered medications on file prior to visit.    There are no Patient Instructions on file for this visit. No follow-ups on file.   Kris Hartmann, NP

## 2021-01-25 ENCOUNTER — Other Ambulatory Visit: Payer: Self-pay | Admitting: Oncology

## 2021-01-25 MED ORDER — RIVAROXABAN 20 MG PO TABS
20.0000 mg | ORAL_TABLET | Freq: Every day | ORAL | 1 refills | Status: DC
Start: 2021-01-25 — End: 2021-08-18

## 2021-02-04 DIAGNOSIS — Z48812 Encounter for surgical aftercare following surgery on the circulatory system: Secondary | ICD-10-CM | POA: Diagnosis not present

## 2021-02-04 DIAGNOSIS — D62 Acute posthemorrhagic anemia: Secondary | ICD-10-CM | POA: Diagnosis not present

## 2021-02-04 DIAGNOSIS — Z95828 Presence of other vascular implants and grafts: Secondary | ICD-10-CM | POA: Diagnosis not present

## 2021-02-09 ENCOUNTER — Encounter: Payer: Self-pay | Admitting: Oncology

## 2021-02-16 ENCOUNTER — Ambulatory Visit (INDEPENDENT_AMBULATORY_CARE_PROVIDER_SITE_OTHER): Payer: Medicare (Managed Care) | Admitting: Vascular Surgery

## 2021-02-16 ENCOUNTER — Encounter (INDEPENDENT_AMBULATORY_CARE_PROVIDER_SITE_OTHER): Payer: Self-pay | Admitting: Vascular Surgery

## 2021-02-16 ENCOUNTER — Other Ambulatory Visit: Payer: Self-pay

## 2021-02-16 VITALS — BP 113/75 | HR 106 | Resp 20 | Wt 255.0 lb

## 2021-02-16 DIAGNOSIS — I825Y3 Chronic embolism and thrombosis of unspecified deep veins of proximal lower extremity, bilateral: Secondary | ICD-10-CM | POA: Diagnosis not present

## 2021-02-16 DIAGNOSIS — I10 Essential (primary) hypertension: Secondary | ICD-10-CM

## 2021-02-16 DIAGNOSIS — E119 Type 2 diabetes mellitus without complications: Secondary | ICD-10-CM | POA: Diagnosis not present

## 2021-02-16 NOTE — Progress Notes (Signed)
MRN : [id number]  Courtney Mack is a 65 y.o. ([birth year]) female who presents with chief complaint of  Chief Complaint  Patient presents with   Follow-up    3 wk follow up  .  History of Present Illness: Patient returns today in follow up of her leg swelling and bilateral lower extremity DVTs.  She has been much more active and her leg swelling is better.  It still quite swollen on both legs, but not near as much as it was prior to the procedure and even better than her last visit.  She denies any ulceration or infection.  No chest pain or shortness of breath.  Current Outpatient Medications  Medication Sig Dispense Refill   DULERA 100-5 MCG/ACT AERO Inhale 2 puffs into the lungs 2 (two) times daily.     losartan (COZAAR) 25 MG tablet Take 1 tablet (25 mg total) by mouth daily. 15 tablet 0   metFORMIN (GLUCOPHAGE) 500 MG tablet Take 1 tablet (500 mg total) by mouth 2 (two) times daily with a meal. 30 tablet 0   Multiple Vitamin (MULTIVITAMIN WITH MINERALS) TABS tablet Take 1 tablet by mouth daily.     ondansetron (ZOFRAN) 4 MG tablet Take 1 tablet (4 mg total) by mouth every 6 (six) hours as needed for nausea. 20 tablet 0   rivaroxaban (XARELTO) 20 MG TABS tablet Take 1 tablet (20 mg total) by mouth daily with supper. 90 tablet 1   rosuvastatin (CRESTOR) 10 MG tablet Take 1 tablet (10 mg total) by mouth daily. 15 tablet 0   No current facility-administered medications for this visit.    Past Medical History:  Diagnosis Date   Diabetes mellitus without complication ([place])    DVT (deep venous thrombosis) ([place]) 09/06/2020   High cholesterol    Hx of blood clots    Hypertension    IDA (iron deficiency anemia) 09/21/2020    Past Surgical History:  Procedure Laterality Date   IVC FILTER INSERTION N/A 08/18/2020   Procedure: IVC FILTER INSERTION;  Surgeon: Katha Cabal, MD;  Location: [facility];  Service: Cardiovascular;  Laterality: N/A;   PERIPHERAL VASCULAR  THROMBECTOMY Bilateral 01/03/2021   Procedure: PERIPHERAL VASCULAR THROMBECTOMY;  Surgeon: Algernon Huxley, MD;  Location: [facility];  Service: Cardiovascular;  Laterality: Bilateral;   PULMONARY THROMBECTOMY N/A 08/18/2020   Procedure: PULMONARY THROMBECTOMY;  Surgeon: Katha Cabal, MD;  Location: [facility];  Service: Cardiovascular;  Laterality: N/A;   TUBAL LIGATION       Social History   Tobacco Use   Smoking status: Former    Packs/day: 0.25    Years: 0.50    Pack years: 0.13    Types: Cigarettes    Quit date: 09/21/1980    Years since quitting: 40.4   Smokeless tobacco: Never   Tobacco comments:    smoked 2-4 cigarettes for about 2-3 weeks   Vaping Use   Vaping Use: Never used  Substance Use Topics   Alcohol use: Not Currently   Drug use: Not Currently       Family History  Problem Relation Age of Onset   Hypertension Mother    Diabetes Mother    Diabetes Father    Hypertension Father    High Cholesterol Father    Congestive Heart Failure Father      No Known Allergies   REVIEW OF SYSTEMS (Negative unless checked)  Constitutional: '[]'$ Weight loss  '[]'$ Fever  '[]'$ Chills Cardiac: '[]'$   Chest pain   '[]'$ Chest pressure   '[]'$ Palpitations   '[]'$ Shortness of breath when laying flat   '[]'$ Shortness of breath at rest   '[]'$ Shortness of breath with exertion. Vascular:  '[]'$ Pain in legs with walking   '[]'$ Pain in legs at rest   '[]'$ Pain in legs when laying flat   '[]'$ Claudication   '[]'$ Pain in feet when walking  '[]'$ Pain in feet at rest  '[]'$ Pain in feet when laying flat   '[x]'$ History of DVT   '[]'$ Phlebitis   '[x]'$ Swelling in legs   '[]'$ Varicose veins   '[]'$ Non-healing ulcers Pulmonary:   '[]'$ Uses home oxygen   '[]'$ Productive cough   '[]'$ Hemoptysis   '[]'$ Wheeze  '[]'$ COPD   '[]'$ Asthma Neurologic:  '[]'$ Dizziness  '[]'$ Blackouts   '[]'$ Seizures   '[]'$ History of stroke   '[]'$ History of TIA  '[]'$ Aphasia   '[]'$ Temporary blindness   '[]'$ Dysphagia   '[]'$ Weakness or numbness in arms   '[]'$ Weakness or numbness in  legs Musculoskeletal:  '[x]'$ Arthritis   '[]'$ Joint swelling   '[x]'$ Joint pain   '[]'$ Low back pain Hematologic:  '[]'$ Easy bruising  '[]'$ Easy bleeding   '[]'$ Hypercoagulable state   '[]'$ Anemic   Gastrointestinal:  '[]'$ Blood in stool   '[]'$ Vomiting blood  '[]'$ Gastroesophageal reflux/heartburn   '[]'$ Abdominal pain Genitourinary:  '[]'$ Chronic kidney disease   '[]'$ Difficult urination  '[]'$ Frequent urination  '[]'$ Burning with urination   '[]'$ Hematuria Skin:  '[]'$ Rashes   '[]'$ Ulcers   '[]'$ Wounds Psychological:  '[]'$ History of anxiety   '[]'$  History of major depression.  Physical Examination  BP 113/75 (BP Location: Left Arm)   Pulse (!) 106   Resp 20   Wt 255 lb (115.7 kg)   BMI 51.50 kg/m  Gen:  WD/WN, NAD Head: [place]/AT, No temporalis wasting. Ear/Nose/Throat: Hearing grossly intact, nares w/o erythema or drainage Eyes: Conjunctiva clear. Sclera non-icteric Neck: Supple.  Trachea midline Pulmonary:  Good air movement, no use of accessory muscles.  Cardiac: RRR, no JVD Vascular:  Vessel Right Left  Radial Palpable Palpable           Musculoskeletal: M/S 5/5 throughout.  No deformity or atrophy.  In a wheelchair.  2-3+ bilateral lower extremity edema.  The skin is much less tight and there is no skin threat. Neurologic: Sensation grossly intact in extremities.  Symmetrical.  Speech is fluent.  Psychiatric: Judgment intact, Mood & affect appropriate for pt's clinical situation. Dermatologic: No rashes or ulcers noted.  No cellulitis or open wounds.      Labs Recent Results (from the past 2160 hour(s))  CBC     Status: Abnormal   Collection Time: 01/02/21  5:34 AM  Result Value Ref Range   WBC 6.7 4.0 - 10.5 K/uL   RBC 3.61 (L) 3.87 - 5.11 MIL/uL   Hemoglobin 10.6 (L) 12.0 - 15.0 g/dL   HCT 32.6 (L) 36.0 - 46.0 %   MCV 90.3 80.0 - 100.0 fL   MCH 29.4 26.0 - 34.0 pg   MCHC 32.5 30.0 - 36.0 g/dL   RDW 17.5 (H) 11.5 - 15.5 %   Platelets 306 150 - 400 K/uL   nRBC 0.0 0.0 - 0.2 %    Comment: Performed at [facility],  [address], [place], [place] [id number]  Basic metabolic panel     Status: Abnormal   Collection Time: 01/02/21  5:34 AM  Result Value Ref Range   Sodium 136 135 - 145 mmol/L   Potassium 3.9 3.5 - 5.1 mmol/L   Chloride 103 98 - 111 mmol/L   CO2 23 22 - 32 mmol/L  Glucose, Bld 140 (H) 70 - 99 mg/dL    Comment: Glucose reference range applies only to samples taken after fasting for at least 8 hours.   BUN 13 8 - 23 mg/dL   Creatinine, Ser 0.98 0.44 - 1.00 mg/dL   Calcium 8.6 (L) 8.9 - 10.3 mg/dL   GFR, Estimated >60 >60 mL/min    Comment: (NOTE) Calculated using the CKD-EPI Creatinine Equation (2021)    Anion gap 10 5 - 15    Comment: Performed at [facility], [address] [place], [place] [address]  Brain natriuretic peptide     Status: None   Collection Time: 01/02/21  5:34 AM  Result Value Ref Range   B Natriuretic Peptide 87.9 0.0 - 100.0 pg/mL    Comment: Performed at [facility], [place], [place] [address]  Troponin I (High Sensitivity)     Status: None   Collection Time: 01/02/21  5:34 AM  Result Value Ref Range   Troponin I (High Sensitivity) 4 <18 ng/L    Comment: (NOTE) Elevated high sensitivity troponin I (hsTnI) values and significant  changes across serial measurements may suggest ACS but many other  chronic and acute conditions are known to elevate hsTnI results.  Refer to the "Links" section for chest pain algorithms and additional  guidance. Performed at [facility], [place], [place] [address]   Procalcitonin - Baseline     Status: None   Collection Time: 01/02/21  5:34 AM  Result Value Ref Range   Procalcitonin 0.23 ng/mL    Comment:        Interpretation: PCT (Procalcitonin) <= 0.5 ng/mL: Systemic infection (sepsis) is not likely. Local bacterial infection is possible. (NOTE)       Sepsis PCT Algorithm           Lower Respiratory Tract                                      Infection  PCT Algorithm    ----------------------------     ----------------------------         PCT < 0.25 ng/mL                PCT < 0.10 ng/mL          Strongly encourage             Strongly discourage   discontinuation of antibiotics    initiation of antibiotics    ----------------------------     -----------------------------       PCT 0.25 - 0.50 ng/mL            PCT 0.10 - 0.25 ng/mL               OR       >80% decrease in PCT            Discourage initiation of                                            antibiotics      Encourage discontinuation           of antibiotics    ----------------------------     -----------------------------         PCT >= 0.50 ng/mL  PCT 0.26 - 0.50 ng/mL               AND        <80% decrease in PCT             Encourage initiation of                                             antibiotics       Encourage continuation           of antibiotics    ----------------------------     -----------------------------        PCT >= 0.50 ng/mL                  PCT > 0.50 ng/mL               AND         increase in PCT                  Strongly encourage                                      initiation of antibiotics    Strongly encourage escalation           of antibiotics                                     -----------------------------                                           PCT <= 0.25 ng/mL                                                 OR                                        > 80% decrease in PCT                                      Discontinue / Do not initiate                                             antibiotics  Performed at [facility], [address] [place], [place] [address]   Culture, blood (routine x 2)     Status: None   Collection Time: 01/02/21  6:37 AM   Specimen: BLOOD  Result Value Ref Range   Specimen Description BLOOD LEFT FA    Special Requests      BOTTLES DRAWN AEROBIC AND ANAEROBIC Blood Culture  results may not be optimal due to an inadequate volume of blood received in  culture bottles   Culture      NO GROWTH 7 DAYS Performed at [facility], [address] [place], [place] [address]    Report Status 01/09/2021 FINAL   Culture, blood (routine x 2)     Status: None   Collection Time: 01/02/21  6:37 AM   Specimen: BLOOD  Result Value Ref Range   Specimen Description BLOOD RIGHT AC    Special Requests      BOTTLES DRAWN AEROBIC AND ANAEROBIC Blood Culture adequate volume   Culture      NO GROWTH 7 DAYS Performed at [facility], [address] [place], [place] [address]    Report Status 01/09/2021 FINAL   Lactic acid, plasma     Status: None   Collection Time: 01/02/21  6:37 AM  Result Value Ref Range   Lactic Acid, Venous 1.4 0.5 - 1.9 mmol/L    Comment: Performed at [facility], [address] [place], [place] [address]  Resp Panel by RT-PCR (Flu A&B, Covid) Nasopharyngeal Swab     Status: None   Collection Time: 01/02/21  6:37 AM   Specimen: Nasopharyngeal Swab; Nasopharyngeal(NP) swabs in vial transport medium  Result Value Ref Range   SARS Coronavirus 2 by RT PCR NEGATIVE NEGATIVE    Comment: (NOTE) SARS-CoV-2 target nucleic acids are NOT DETECTED.  The SARS-CoV-2 RNA is generally detectable in upper respiratory specimens during the acute phase of infection. The lowest concentration of SARS-CoV-2 viral copies this assay can detect is 138 copies/mL. A negative result does not preclude SARS-Cov-2 infection and should not be used as the sole basis for treatment or other patient management decisions. A negative result may occur with  improper specimen collection/handling, submission of specimen other than nasopharyngeal swab, presence of viral mutation(s) within the areas targeted by this assay, and inadequate number of viral copies(<138 copies/mL). A negative result must be combined with clinical observations, patient history, and  epidemiological information. The expected result is Negative.  Fact Sheet for Patients:  EntrepreneurPulse.com.au  Fact Sheet for Healthcare Providers:  IncredibleEmployment.be  This test is no t yet approved or cleared by the [place] FDA and  has been authorized for detection and/or diagnosis of SARS-CoV-2 by FDA under an Emergency Use Authorization (EUA). This EUA will remain  in effect (meaning this test can be used) for the duration of the COVID-19 declaration under Section 564(b)(1) of the Act, 21 U.S.C.section 360bbb-3(b)(1), unless the authorization is terminated  or revoked sooner.       Influenza A by PCR NEGATIVE NEGATIVE   Influenza B by PCR NEGATIVE NEGATIVE    Comment: (NOTE) The Xpert Xpress SARS-CoV-2/FLU/RSV plus assay is intended as an aid in the diagnosis of influenza from Nasopharyngeal swab specimens and should not be used as a sole basis for treatment. Nasal washings and aspirates are unacceptable for Xpert Xpress SARS-CoV-2/FLU/RSV testing.  Fact Sheet for Patients: EntrepreneurPulse.com.au  Fact Sheet for Healthcare Providers: IncredibleEmployment.be  This test is not yet approved or cleared by the [place] FDA and has been authorized for detection and/or diagnosis of SARS-CoV-2 by FDA under an Emergency Use Authorization (EUA). This EUA will remain in effect (meaning this test can be used) for the duration of the COVID-19 declaration under Section 564(b)(1) of the Act, 21 U.S.C. section 360bbb-3(b)(1), unless the authorization is terminated or revoked.  Performed at [facility], [address] [place], [place] [address]   APTT     Status: None  Collection Time: 01/02/21 10:51 AM  Result Value Ref Range   aPTT 26 24 - 36 seconds    Comment: Performed at [facility], [place], [place] [address]  Heparin level (unfractionated)      Status: Abnormal   Collection Time: 01/02/21 10:51 AM  Result Value Ref Range   Heparin Unfractionated <0.10 (L) 0.30 - 0.70 IU/mL    Comment: (NOTE) The clinical reportable range upper limit is being lowered to >1.10 to align with the FDA approved guidance for the current laboratory assay.  If heparin results are below expected values, and patient dosage has  been confirmed, suggest follow up testing of antithrombin III levels. Performed at [facility], [address] [place], [place] [address]   Protime-INR     Status: None   Collection Time: 01/02/21 10:51 AM  Result Value Ref Range   Prothrombin Time 14.9 11.4 - 15.2 seconds   INR 1.2 0.8 - 1.2    Comment: (NOTE) INR goal varies based on device and disease states. Performed at [facility], [address] [place], [place] [address]   Hemoglobin A1c     Status: Abnormal   Collection Time: 01/02/21  1:58 PM  Result Value Ref Range   Hgb A1c MFr Bld 6.2 (H) 4.8 - 5.6 %    Comment: (NOTE)         Prediabetes: 5.7 - 6.4         Diabetes: >6.4         Glycemic control for adults with diabetes: <7.0    Mean Plasma Glucose 131 mg/dL    Comment: (NOTE) Performed At: [facility] [address] [place], [place] [id number] Rush Farmer MD [id number]   Glucose, capillary     Status: None   Collection Time: 01/02/21  3:53 PM  Result Value Ref Range   Glucose-Capillary 82 70 - 99 mg/dL    Comment: Glucose reference range applies only to samples taken after fasting for at least 8 hours.  Heparin level (unfractionated)     Status: Abnormal   Collection Time: 01/02/21  5:20 PM  Result Value Ref Range   Heparin Unfractionated <0.10 (L) 0.30 - 0.70 IU/mL    Comment: REPEATED TO VERIFY (NOTE) The clinical reportable range upper limit is being lowered to >1.10 to align with the FDA approved guidance for the current laboratory assay.  If heparin results are below expected values, and patient  dosage has  been confirmed, suggest follow up testing of antithrombin III levels. Performed at [facility], [address] [place], [place] [address]   CBC     Status: Abnormal   Collection Time: 01/03/21  2:02 AM  Result Value Ref Range   WBC 4.5 4.0 - 10.5 K/uL   RBC 3.30 (L) 3.87 - 5.11 MIL/uL   Hemoglobin 9.6 (L) 12.0 - 15.0 g/dL   HCT 30.2 (L) 36.0 - 46.0 %   MCV 91.5 80.0 - 100.0 fL   MCH 29.1 26.0 - 34.0 pg   MCHC 31.8 30.0 - 36.0 g/dL   RDW 17.5 (H) 11.5 - 15.5 %   Platelets 258 150 - 400 K/uL   nRBC 0.0 0.0 - 0.2 %    Comment: Performed at [facility], [address], [place],  [id number]  Basic metabolic panel     Status: Abnormal   Collection Time: 01/03/21  2:02 AM  Result Value Ref Range   Sodium 136 135 - 145  mmol/L   Potassium 4.3 3.5 - 5.1 mmol/L   Chloride 107 98 - 111 mmol/L   CO2 26 22 - 32 mmol/L   Glucose, Bld 110 (H) 70 - 99 mg/dL    Comment: Glucose reference range applies only to samples taken after fasting for at least 8 hours.   BUN 12 8 - 23 mg/dL   Creatinine, Ser 0.85 0.44 - 1.00 mg/dL   Calcium 8.3 (L) 8.9 - 10.3 mg/dL   GFR, Estimated >60 >60 mL/min    Comment: (NOTE) Calculated using the CKD-EPI Creatinine Equation (2021)    Anion gap 3 (L) 5 - 15    Comment: Performed at [facility], [place], [place] [address]  Heparin level (unfractionated)     Status: Abnormal   Collection Time: 01/03/21  2:02 AM  Result Value Ref Range   Heparin Unfractionated 0.17 (L) 0.30 - 0.70 IU/mL    Comment: (NOTE) The clinical reportable range upper limit is being lowered to >1.10 to align with the FDA approved guidance for the current laboratory assay.  If heparin results are below expected values, and patient dosage has  been confirmed, suggest follow up testing of antithrombin III levels. Performed at [facility], [address] [place], [place] [address]   Glucose, capillary      Status: None   Collection Time: 01/03/21  8:32 AM  Result Value Ref Range   Glucose-Capillary 93 70 - 99 mg/dL    Comment: Glucose reference range applies only to samples taken after fasting for at least 8 hours.  Heparin level (unfractionated)     Status: Abnormal   Collection Time: 01/03/21  8:54 AM  Result Value Ref Range   Heparin Unfractionated 0.24 (L) 0.30 - 0.70 IU/mL    Comment: (NOTE) The clinical reportable range upper limit is being lowered to >1.10 to align with the FDA approved guidance for the current laboratory assay.  If heparin results are below expected values, and patient dosage has  been confirmed, suggest follow up testing of antithrombin III levels. Performed at [facility], [address] [place], [place] [address]   Glucose, capillary     Status: None   Collection Time: 01/03/21  1:57 PM  Result Value Ref Range   Glucose-Capillary 92 70 - 99 mg/dL    Comment: Glucose reference range applies only to samples taken after fasting for at least 8 hours.  Glucose, capillary     Status: None   Collection Time: 01/03/21  4:25 PM  Result Value Ref Range   Glucose-Capillary 90 70 - 99 mg/dL    Comment: Glucose reference range applies only to samples taken after fasting for at least 8 hours.  Glucose, capillary     Status: Abnormal   Collection Time: 01/03/21  5:31 PM  Result Value Ref Range   Glucose-Capillary 105 (H) 70 - 99 mg/dL    Comment: Glucose reference range applies only to samples taken after fasting for at least 8 hours.  Glucose, capillary     Status: Abnormal   Collection Time: 01/03/21  9:39 PM  Result Value Ref Range   Glucose-Capillary 145 (H) 70 - 99 mg/dL    Comment: Glucose reference range applies only to samples taken after fasting for at least 8 hours.  CBC with Differential/Platelet     Status: Abnormal   Collection Time: 01/04/21 12:21 AM  Result Value Ref Range   WBC 5.2 4.0 - 10.5 K/uL   RBC  3.20 (L) 3.87 - 5.11 MIL/uL    Hemoglobin 9.4 (L) 12.0 - 15.0 g/dL   HCT 29.0 (L) 36.0 - 46.0 %   MCV 90.6 80.0 - 100.0 fL   MCH 29.4 26.0 - 34.0 pg   MCHC 32.4 30.0 - 36.0 g/dL   RDW 17.3 (H) 11.5 - 15.5 %   Platelets 274 150 - 400 K/uL   nRBC 0.0 0.0 - 0.2 %   Neutrophils Relative % 77 %   Neutro Abs 4.1 1.7 - 7.7 K/uL   Lymphocytes Relative 11 %   Lymphs Abs 0.6 (L) 0.7 - 4.0 K/uL   Monocytes Relative 8 %   Monocytes Absolute 0.4 0.1 - 1.0 K/uL   Eosinophils Relative 2 %   Eosinophils Absolute 0.1 0.0 - 0.5 K/uL   Basophils Relative 1 %   Basophils Absolute 0.0 0.0 - 0.1 K/uL   Immature Granulocytes 1 %   Abs Immature Granulocytes 0.03 0.00 - 0.07 K/uL    Comment: Performed at [facility], [address] [place], [place] [id number]  Basic metabolic panel     Status: Abnormal   Collection Time: 01/04/21 12:21 AM  Result Value Ref Range   Sodium 136 135 - 145 mmol/L   Potassium 4.6 3.5 - 5.1 mmol/L   Chloride 104 98 - 111 mmol/L   CO2 26 22 - 32 mmol/L   Glucose, Bld 136 (H) 70 - 99 mg/dL    Comment: Glucose reference range applies only to samples taken after fasting for at least 8 hours.   BUN 11 8 - 23 mg/dL   Creatinine, Ser 1.02 (H) 0.44 - 1.00 mg/dL   Calcium 8.1 (L) 8.9 - 10.3 mg/dL   GFR, Estimated >60 >60 mL/min    Comment: (NOTE) Calculated using the CKD-EPI Creatinine Equation (2021)    Anion gap 6 5 - 15    Comment: Performed at [facility] [place], [place] [address]  Heparin level (unfractionated)     Status: Abnormal   Collection Time: 01/04/21 12:21 AM  Result Value Ref Range   Heparin Unfractionated 0.28 (L) 0.30 - 0.70 IU/mL    Comment: (NOTE) The clinical reportable range upper limit is being lowered to >1.10 to align with the FDA approved guidance for the current laboratory assay.  If heparin results are below expected values, and patient dosage has  been confirmed, suggest follow up testing of antithrombin III levels. Performed at [facility], [place], [place] [address]   Heparin level (unfractionated)     Status: Abnormal   Collection Time: 01/04/21  6:53 AM  Result Value Ref Range   Heparin Unfractionated 0.25 (L) 0.30 - 0.70 IU/mL    Comment: (NOTE) The clinical reportable range upper limit is being lowered to >1.10 to align with the FDA approved guidance for the current laboratory assay.  If heparin results are below expected values, and patient dosage has  been confirmed, suggest follow up testing of antithrombin III levels. Performed at [facility], [address] [place], [place] [address]   Glucose, capillary     Status: Abnormal   Collection Time: 01/04/21  7:26 AM  Result Value Ref Range   Glucose-Capillary 139 (H) 70 - 99 mg/dL    Comment: Glucose reference range applies only to samples taken after fasting for at least 8 hours.  Glucose, capillary     Status: Abnormal   Collection Time: 01/04/21 11:32 AM  Result Value  Ref Range   Glucose-Capillary 154 (H) 70 - 99 mg/dL    Comment: Glucose reference range applies only to samples taken after fasting for at least 8 hours.  Heparin level (unfractionated)     Status: Abnormal   Collection Time: 01/04/21  2:42 PM  Result Value Ref Range   Heparin Unfractionated 0.24 (L) 0.30 - 0.70 IU/mL    Comment: (NOTE) The clinical reportable range upper limit is being lowered to >1.10 to align with the FDA approved guidance for the current laboratory assay.  If heparin results are below expected values, and patient dosage has  been confirmed, suggest follow up testing of antithrombin III levels. Performed at [facility], [address] [place], [place] [address]   Glucose, capillary     Status: Abnormal   Collection Time: 01/04/21  4:43 PM  Result Value Ref Range   Glucose-Capillary 154 (H) 70 - 99 mg/dL    Comment: Glucose reference range applies only to samples taken after fasting for at least 8 hours.   Glucose, capillary     Status: Abnormal   Collection Time: 01/04/21  9:02 PM  Result Value Ref Range   Glucose-Capillary 119 (H) 70 - 99 mg/dL    Comment: Glucose reference range applies only to samples taken after fasting for at least 8 hours.  Heparin level (unfractionated)     Status: None   Collection Time: 01/04/21  9:38 PM  Result Value Ref Range   Heparin Unfractionated 0.50 0.30 - 0.70 IU/mL    Comment: (NOTE) The clinical reportable range upper limit is being lowered to >1.10 to align with the FDA approved guidance for the current laboratory assay.  If heparin results are below expected values, and patient dosage has  been confirmed, suggest follow up testing of antithrombin III levels. Performed at [facility], [address] [place], [place] [address]   CBC with Differential/Platelet     Status: Abnormal   Collection Time: 01/05/21  5:55 AM  Result Value Ref Range   WBC 6.0 4.0 - 10.5 K/uL   RBC 3.15 (L) 3.87 - 5.11 MIL/uL   Hemoglobin 9.2 (L) 12.0 - 15.0 g/dL   HCT 27.8 (L) 36.0 - 46.0 %   MCV 88.3 80.0 - 100.0 fL   MCH 29.2 26.0 - 34.0 pg   MCHC 33.1 30.0 - 36.0 g/dL   RDW 17.3 (H) 11.5 - 15.5 %   Platelets 272 150 - 400 K/uL   nRBC 0.0 0.0 - 0.2 %   Neutrophils Relative % 75 %   Neutro Abs 4.5 1.7 - 7.7 K/uL   Lymphocytes Relative 12 %   Lymphs Abs 0.7 0.7 - 4.0 K/uL   Monocytes Relative 8 %   Monocytes Absolute 0.5 0.1 - 1.0 K/uL   Eosinophils Relative 3 %   Eosinophils Absolute 0.2 0.0 - 0.5 K/uL   Basophils Relative 1 %   Basophils Absolute 0.0 0.0 - 0.1 K/uL   Immature Granulocytes 1 %   Abs Immature Granulocytes 0.07 0.00 - 0.07 K/uL    Comment: Performed at [facility], [address], [place], [place] [id number]  Basic metabolic panel     Status: Abnormal   Collection Time: 01/05/21  5:55 AM  Result Value Ref Range   Sodium 135 135 - 145 mmol/L   Potassium 4.0 3.5 - 5.1 mmol/L   Chloride 106 98 - 111 mmol/L   CO2 25 22 -  32 mmol/L   Glucose, Bld 156 (  H) 70 - 99 mg/dL    Comment: Glucose reference range applies only to samples taken after fasting for at least 8 hours.   BUN 11 8 - 23 mg/dL   Creatinine, Ser 0.82 0.44 - 1.00 mg/dL   Calcium 8.1 (L) 8.9 - 10.3 mg/dL   GFR, Estimated >60 >60 mL/min    Comment: (NOTE) Calculated using the CKD-EPI Creatinine Equation (2021)    Anion gap 4 (L) 5 - 15    Comment: Performed at [facility], [place], [place] [address]  Heparin level (unfractionated)     Status: None   Collection Time: 01/05/21  5:55 AM  Result Value Ref Range   Heparin Unfractionated 0.45 0.30 - 0.70 IU/mL    Comment: (NOTE) The clinical reportable range upper limit is being lowered to >1.10 to align with the FDA approved guidance for the current laboratory assay.  If heparin results are below expected values, and patient dosage has  been confirmed, suggest follow up testing of antithrombin III levels. Performed at [facility], [address] [place], [place] [address]   Glucose, capillary     Status: Abnormal   Collection Time: 01/05/21  7:43 AM  Result Value Ref Range   Glucose-Capillary 134 (H) 70 - 99 mg/dL    Comment: Glucose reference range applies only to samples taken after fasting for at least 8 hours.  Glucose, capillary     Status: Abnormal   Collection Time: 01/05/21 11:32 AM  Result Value Ref Range   Glucose-Capillary 139 (H) 70 - 99 mg/dL    Comment: Glucose reference range applies only to samples taken after fasting for at least 8 hours.  Comprehensive metabolic panel     Status: Abnormal   Collection Time: 01/15/21 10:10 AM  Result Value Ref Range   Sodium 136 135 - 145 mmol/L   Potassium 4.3 3.5 - 5.1 mmol/L   Chloride 101 98 - 111 mmol/L   CO2 26 22 - 32 mmol/L   Glucose, Bld 122 (H) 70 - 99 mg/dL    Comment: Glucose reference range applies only to samples taken after fasting for at least 8 hours.   BUN 10 8 - 23 mg/dL    Creatinine, Ser 0.86 0.44 - 1.00 mg/dL   Calcium 9.3 8.9 - 10.3 mg/dL   Total Protein 7.8 6.5 - 8.1 g/dL   Albumin 3.2 (L) 3.5 - 5.0 g/dL   AST 20 15 - 41 U/L   ALT 12 0 - 44 U/L   Alkaline Phosphatase 57 38 - 126 U/L   Total Bilirubin 0.6 0.3 - 1.2 mg/dL   GFR, Estimated >60 >60 mL/min    Comment: (NOTE) Calculated using the CKD-EPI Creatinine Equation (2021)    Anion gap 9 5 - 15    Comment: Performed at [facility], [address] [place], [place] [address]  CBC with Differential/Platelet     Status: Abnormal   Collection Time: 01/15/21 10:10 AM  Result Value Ref Range   WBC 5.9 4.0 - 10.5 K/uL   RBC 3.71 (L) 3.87 - 5.11 MIL/uL   Hemoglobin 10.7 (L) 12.0 - 15.0 g/dL   HCT 34.4 (L) 36.0 - 46.0 %   MCV 92.7 80.0 - 100.0 fL   MCH 28.8 26.0 - 34.0 pg   MCHC 31.1 30.0 - 36.0 g/dL   RDW 16.8 (H) 11.5 - 15.5 %   Platelets 351 150 - 400 K/uL   nRBC 0.0 0.0 - 0.2 %  Neutrophils Relative % 79 %   Neutro Abs 4.7 1.7 - 7.7 K/uL   Lymphocytes Relative 10 %   Lymphs Abs 0.6 (L) 0.7 - 4.0 K/uL   Monocytes Relative 6 %   Monocytes Absolute 0.4 0.1 - 1.0 K/uL   Eosinophils Relative 3 %   Eosinophils Absolute 0.2 0.0 - 0.5 K/uL   Basophils Relative 1 %   Basophils Absolute 0.1 0.0 - 0.1 K/uL   Immature Granulocytes 1 %   Abs Immature Granulocytes 0.03 0.00 - 0.07 K/uL    Comment: Performed at [facility], [address] [place], [place] [address]  Magnesium     Status: None   Collection Time: 01/15/21 10:10 AM  Result Value Ref Range   Magnesium 1.7 1.7 - 2.4 mg/dL    Comment: Performed at [facility], [address] [place], [place] [address]    Radiology VAS [place] LOWER EXTREMITY VENOUS (DVT)  Result Date: 01/23/2021  Lower Venous DVT Study Patient Name:  Courtney Mack  Date of Exam:   01/22/2021 Medical Rec #: [id number]           Accession #:    [id number] Date of Birth: February 01, 1956           Patient Gender: F Patient Age:   14Y Exam Location:   [facility] Procedure:      VAS [place] LOWER EXTREMITY VENOUS (DVT) Referring Phys: [id number] Erskine Squibb Ladasha Schnackenberg --------------------------------------------------------------------------------  Indications: Pain, Swelling, Post-op, and thrombectomy 6/29.  Comparison Study: 01/02/21 Performing Technologist: Concha Norway RVT  Examination Guidelines: A complete evaluation includes B-mode imaging, spectral Doppler, color Doppler, and power Doppler as needed of all accessible portions of each vessel. Bilateral testing is considered an integral part of a complete examination. Limited examinations for reoccurring indications may be performed as noted. The reflux portion of the exam is performed with the patient in reverse Trendelenburg.  +-------+---------------+---------+-----------+------------------+-------------+ RIGHT  CompressibilityPhasicitySpontaneityProperties        Thrombus                                                                  Aging         +-------+---------------+---------+-----------+------------------+-------------+ CFV    Partial                            partially                                                                 re-cannalized                   +-------+---------------+---------+-----------+------------------+-------------+ FV ProxPartial                            partially  re-cannalized                   +-------+---------------+---------+-----------+------------------+-------------+ POP    Partial                            partially                                                                 re-cannalized                   +-------+---------------+---------+-----------+------------------+-------------+ PTV    Partial                            partially                                                                 re-cannalized                    +-------+---------------+---------+-----------+------------------+-------------+ GSV    Full           Yes                                                 +-------+---------------+---------+-----------+------------------+-------------+   +-------+---------------+---------+-----------+------------------+-------------+ LEFT   CompressibilityPhasicitySpontaneityProperties        Thrombus                                                                  Aging         +-------+---------------+---------+-----------+------------------+-------------+ CFV    Partial                            partially                                                                 re-cannalized                   +-------+---------------+---------+-----------+------------------+-------------+ FV ProxNone                                                               +-------+---------------+---------+-----------+------------------+-------------+ PFV    Full  Yes      Yes                                        +-------+---------------+---------+-----------+------------------+-------------+ POP    None                                                               +-------+---------------+---------+-----------+------------------+-------------+ PTV    Full           Yes      Yes                                        +-------+---------------+---------+-----------+------------------+-------------+ GSV    Full           Yes      Yes                                        +-------+---------------+---------+-----------+------------------+-------------+     Summary: RIGHT: - Findings consistent with age indeterminate deep vein thrombosis involving the right common femoral vein, right femoral vein, and right popliteal vein. - Extremely limited study bilaterally due to patient body habitus and severe edema tp lower extremities.  LEFT: - Findings consistent with age  indeterminate deep vein thrombosis involving the left common femoral vein, SF junction, left femoral vein, and left popliteal vein. - Extremely limited study bilaterally due to patient body habitus and severe edema tp lower extremitie  *See table(s) above for measurements and observations. Electronically signed by Leotis Pain MD on 01/23/2021 at 1:32:12 PM.    Final     Assessment/Plan  DVT (deep venous thrombosis) ([place]) The patient has chronic DVT of the lower extremities.  She is now on anticoagulation.  Her leg swelling is better.  She is not having any ulceration and her skin is not nearly as tight.  She is being more active.  At this point, we have talked about getting some sort of compression garment as tolerated.  I recommended elevating her legs and staying as active as possible.  We will plan to see her back in 6 months.  Essential hypertension blood pressure control important in reducing the progression of atherosclerotic disease. On appropriate oral medications.   Diabetes mellitus without complication (HCC) blood glucose control important in reducing the progression of atherosclerotic disease. Also, involved in wound healing. On appropriate medications.    Leotis Pain, MD  02/16/2021 11:09 AM    This note was created with Dragon medical transcription system.  Any errors from dictation are purely unintentional

## 2021-02-16 NOTE — Assessment & Plan Note (Signed)
blood glucose control important in reducing the progression of atherosclerotic disease. Also, involved in wound healing. On appropriate medications.  

## 2021-02-16 NOTE — Assessment & Plan Note (Signed)
blood pressure control important in reducing the progression of atherosclerotic disease. On appropriate oral medications.  

## 2021-02-16 NOTE — Assessment & Plan Note (Signed)
The patient has chronic DVT of the lower extremities.  She is now on anticoagulation.  Her leg swelling is better.  She is not having any ulceration and her skin is not nearly as tight.  She is being more active.  At this point, we have talked about getting some sort of compression garment as tolerated.  I recommended elevating her legs and staying as active as possible.  We will plan to see her back in 6 months.

## 2021-03-08 ENCOUNTER — Encounter: Payer: Self-pay | Admitting: Oncology

## 2021-03-14 ENCOUNTER — Other Ambulatory Visit: Payer: Self-pay

## 2021-03-14 ENCOUNTER — Ambulatory Visit
Admission: RE | Admit: 2021-03-14 | Discharge: 2021-03-14 | Disposition: A | Discharge status: Home / Self Care | Payer: Medicare (Managed Care) | Source: Ambulatory Visit | Attending: Family Medicine | Admitting: Family Medicine

## 2021-03-14 DIAGNOSIS — Z1231 Encounter for screening mammogram for malignant neoplasm of breast: Secondary | ICD-10-CM | POA: Diagnosis not present

## 2021-04-18 ENCOUNTER — Ambulatory Visit: Payer: Medicare HMO

## 2021-04-19 ENCOUNTER — Telehealth: Payer: Self-pay

## 2021-04-19 NOTE — Telephone Encounter (Signed)
Called Courtney Mack to remind her about her upcoming appointment with Dr. Theora Gianotti. She asked to have the appointment cancelled and did not want to reschedule at this time. Stated she will call back to reschedule. She was encouraged to do so to continue her surveillance for endometrial cancer.

## 2021-04-25 ENCOUNTER — Ambulatory Visit: Payer: Medicare (Managed Care)

## 2021-05-17 ENCOUNTER — Encounter: Payer: Self-pay | Admitting: *Deleted

## 2021-06-14 ENCOUNTER — Encounter: Payer: Self-pay | Admitting: *Deleted

## 2021-06-20 ENCOUNTER — Other Ambulatory Visit: Payer: Medicare (Managed Care)

## 2021-06-20 ENCOUNTER — Other Ambulatory Visit: Payer: Self-pay

## 2021-06-20 ENCOUNTER — Emergency Department: Payer: Medicare (Managed Care)

## 2021-06-20 ENCOUNTER — Inpatient Hospital Stay: Payer: Medicare (Managed Care)

## 2021-06-20 ENCOUNTER — Inpatient Hospital Stay
Admission: EM | Admit: 2021-06-20 | Discharge: 2021-06-26 | DRG: 872 | Disposition: A | Discharge status: Home Health | Payer: Medicare (Managed Care) | Attending: Internal Medicine | Admitting: Internal Medicine

## 2021-06-20 ENCOUNTER — Ambulatory Visit: Payer: Medicare HMO | Admitting: Radiation Oncology

## 2021-06-20 DIAGNOSIS — Z20822 Contact with and (suspected) exposure to covid-19: Secondary | ICD-10-CM | POA: Diagnosis present

## 2021-06-20 DIAGNOSIS — D649 Anemia, unspecified: Secondary | ICD-10-CM

## 2021-06-20 DIAGNOSIS — Z833 Family history of diabetes mellitus: Secondary | ICD-10-CM | POA: Diagnosis not present

## 2021-06-20 DIAGNOSIS — Z83438 Family history of other disorder of lipoprotein metabolism and other lipidemia: Secondary | ICD-10-CM

## 2021-06-20 DIAGNOSIS — K2970 Gastritis, unspecified, without bleeding: Secondary | ICD-10-CM | POA: Diagnosis present

## 2021-06-20 DIAGNOSIS — Z6841 Body Mass Index (BMI) 40.0 and over, adult: Secondary | ICD-10-CM

## 2021-06-20 DIAGNOSIS — E119 Type 2 diabetes mellitus without complications: Secondary | ICD-10-CM | POA: Diagnosis present

## 2021-06-20 DIAGNOSIS — I2782 Chronic pulmonary embolism: Secondary | ICD-10-CM | POA: Diagnosis not present

## 2021-06-20 DIAGNOSIS — B9562 Methicillin resistant Staphylococcus aureus infection as the cause of diseases classified elsewhere: Secondary | ICD-10-CM | POA: Diagnosis present

## 2021-06-20 DIAGNOSIS — R051 Acute cough: Secondary | ICD-10-CM | POA: Diagnosis not present

## 2021-06-20 DIAGNOSIS — W19XXXA Unspecified fall, initial encounter: Secondary | ICD-10-CM

## 2021-06-20 DIAGNOSIS — I5032 Chronic diastolic (congestive) heart failure: Secondary | ICD-10-CM

## 2021-06-20 DIAGNOSIS — L8990 Pressure ulcer of unspecified site, unspecified stage: Secondary | ICD-10-CM | POA: Insufficient documentation

## 2021-06-20 DIAGNOSIS — L03115 Cellulitis of right lower limb: Secondary | ICD-10-CM | POA: Diagnosis present

## 2021-06-20 DIAGNOSIS — E785 Hyperlipidemia, unspecified: Secondary | ICD-10-CM | POA: Diagnosis present

## 2021-06-20 DIAGNOSIS — Z86718 Personal history of other venous thrombosis and embolism: Secondary | ICD-10-CM

## 2021-06-20 DIAGNOSIS — K5730 Diverticulosis of large intestine without perforation or abscess without bleeding: Secondary | ICD-10-CM | POA: Diagnosis present

## 2021-06-20 DIAGNOSIS — I2699 Other pulmonary embolism without acute cor pulmonale: Secondary | ICD-10-CM | POA: Diagnosis present

## 2021-06-20 DIAGNOSIS — W1830XA Fall on same level, unspecified, initial encounter: Secondary | ICD-10-CM | POA: Diagnosis present

## 2021-06-20 DIAGNOSIS — Z7901 Long term (current) use of anticoagulants: Secondary | ICD-10-CM

## 2021-06-20 DIAGNOSIS — Z86711 Personal history of pulmonary embolism: Secondary | ICD-10-CM

## 2021-06-20 DIAGNOSIS — A419 Sepsis, unspecified organism: Principal | ICD-10-CM

## 2021-06-20 DIAGNOSIS — L89312 Pressure ulcer of right buttock, stage 2: Secondary | ICD-10-CM | POA: Diagnosis present

## 2021-06-20 DIAGNOSIS — L03116 Cellulitis of left lower limb: Secondary | ICD-10-CM | POA: Diagnosis present

## 2021-06-20 DIAGNOSIS — K641 Second degree hemorrhoids: Secondary | ICD-10-CM | POA: Diagnosis present

## 2021-06-20 DIAGNOSIS — K922 Gastrointestinal hemorrhage, unspecified: Secondary | ICD-10-CM

## 2021-06-20 DIAGNOSIS — D62 Acute posthemorrhagic anemia: Secondary | ICD-10-CM

## 2021-06-20 DIAGNOSIS — B3781 Candidal esophagitis: Secondary | ICD-10-CM | POA: Diagnosis present

## 2021-06-20 DIAGNOSIS — Z95828 Presence of other vascular implants and grafts: Secondary | ICD-10-CM

## 2021-06-20 DIAGNOSIS — K449 Diaphragmatic hernia without obstruction or gangrene: Secondary | ICD-10-CM | POA: Diagnosis present

## 2021-06-20 DIAGNOSIS — I82409 Acute embolism and thrombosis of unspecified deep veins of unspecified lower extremity: Secondary | ICD-10-CM | POA: Diagnosis present

## 2021-06-20 DIAGNOSIS — K635 Polyp of colon: Secondary | ICD-10-CM | POA: Diagnosis present

## 2021-06-20 DIAGNOSIS — Z79899 Other long term (current) drug therapy: Secondary | ICD-10-CM

## 2021-06-20 DIAGNOSIS — Z87891 Personal history of nicotine dependence: Secondary | ICD-10-CM

## 2021-06-20 DIAGNOSIS — Y92009 Unspecified place in unspecified non-institutional (private) residence as the place of occurrence of the external cause: Secondary | ICD-10-CM | POA: Diagnosis not present

## 2021-06-20 DIAGNOSIS — Z7984 Long term (current) use of oral hypoglycemic drugs: Secondary | ICD-10-CM

## 2021-06-20 DIAGNOSIS — E7800 Pure hypercholesterolemia, unspecified: Secondary | ICD-10-CM | POA: Diagnosis present

## 2021-06-20 DIAGNOSIS — I110 Hypertensive heart disease with heart failure: Secondary | ICD-10-CM | POA: Diagnosis present

## 2021-06-20 DIAGNOSIS — Z8249 Family history of ischemic heart disease and other diseases of the circulatory system: Secondary | ICD-10-CM | POA: Diagnosis not present

## 2021-06-20 DIAGNOSIS — Z8542 Personal history of malignant neoplasm of other parts of uterus: Secondary | ICD-10-CM

## 2021-06-20 DIAGNOSIS — Z7951 Long term (current) use of inhaled steroids: Secondary | ICD-10-CM

## 2021-06-20 DIAGNOSIS — R059 Cough, unspecified: Secondary | ICD-10-CM | POA: Diagnosis present

## 2021-06-20 HISTORY — DX: Cellulitis of left lower limb: L03.116

## 2021-06-20 LAB — IRON AND TIBC
Iron: 13 ug/dL — ABNORMAL LOW (ref 28–170)
Saturation Ratios: 4 % — ABNORMAL LOW (ref 10.4–31.8)
TIBC: 339 ug/dL (ref 250–450)
UIBC: 326 ug/dL

## 2021-06-20 LAB — CBC WITH DIFFERENTIAL/PLATELET
Abs Immature Granulocytes: 0.22 10*3/uL — ABNORMAL HIGH (ref 0.00–0.07)
Basophils Absolute: 0 10*3/uL (ref 0.0–0.1)
Basophils Relative: 0 %
Eosinophils Absolute: 0 10*3/uL (ref 0.0–0.5)
Eosinophils Relative: 0 %
HCT: 14.9 % — CL (ref 36.0–46.0)
Hemoglobin: 4 g/dL — CL (ref 12.0–15.0)
Immature Granulocytes: 2 %
Lymphocytes Relative: 4 %
Lymphs Abs: 0.5 10*3/uL — ABNORMAL LOW (ref 0.7–4.0)
MCH: 17.9 pg — ABNORMAL LOW (ref 26.0–34.0)
MCHC: 26.8 g/dL — ABNORMAL LOW (ref 30.0–36.0)
MCV: 66.8 fL — ABNORMAL LOW (ref 80.0–100.0)
Monocytes Absolute: 0.6 10*3/uL (ref 0.1–1.0)
Monocytes Relative: 4 %
Neutro Abs: 12.9 10*3/uL — ABNORMAL HIGH (ref 1.7–7.7)
Neutrophils Relative %: 90 %
Platelets: 282 10*3/uL (ref 150–400)
RBC: 2.23 MIL/uL — ABNORMAL LOW (ref 3.87–5.11)
RDW: 20.8 % — ABNORMAL HIGH (ref 11.5–15.5)
Smear Review: NORMAL
WBC: 14.2 10*3/uL — ABNORMAL HIGH (ref 4.0–10.5)
nRBC: 0.2 % (ref 0.0–0.2)

## 2021-06-20 LAB — COMPREHENSIVE METABOLIC PANEL
ALT: 8 U/L (ref 0–44)
AST: 14 U/L — ABNORMAL LOW (ref 15–41)
Albumin: 2.8 g/dL — ABNORMAL LOW (ref 3.5–5.0)
Alkaline Phosphatase: 49 U/L (ref 38–126)
Anion gap: 6 (ref 5–15)
BUN: 14 mg/dL (ref 8–23)
CO2: 23 mmol/L (ref 22–32)
Calcium: 8.6 mg/dL — ABNORMAL LOW (ref 8.9–10.3)
Chloride: 107 mmol/L (ref 98–111)
Creatinine, Ser: 0.71 mg/dL (ref 0.44–1.00)
GFR, Estimated: >60 mL/min (ref >60)
Glucose, Bld: 122 mg/dL — ABNORMAL HIGH (ref 70–99)
Potassium: 3.6 mmol/L (ref 3.5–5.1)
Sodium: 136 mmol/L (ref 135–145)
Total Bilirubin: 1.8 mg/dL — ABNORMAL HIGH (ref 0.3–1.2)
Total Protein: 6.6 g/dL (ref 6.5–8.1)

## 2021-06-20 LAB — CBG MONITORING, ED
Glucose-Capillary: 137 mg/dL — ABNORMAL HIGH (ref 70–99)
Glucose-Capillary: 146 mg/dL — ABNORMAL HIGH (ref 70–99)

## 2021-06-20 LAB — CBC
HCT: 13.4 % — CL (ref 36.0–46.0)
Hemoglobin: 3.6 g/dL — CL (ref 12.0–15.0)
MCH: 18.3 pg — ABNORMAL LOW (ref 26.0–34.0)
MCHC: 26.9 g/dL — ABNORMAL LOW (ref 30.0–36.0)
MCV: 68 fL — ABNORMAL LOW (ref 80.0–100.0)
Platelets: 237 10*3/uL (ref 150–400)
RBC: 1.97 MIL/uL — ABNORMAL LOW (ref 3.87–5.11)
RDW: 20.9 % — ABNORMAL HIGH (ref 11.5–15.5)
WBC: 13.8 10*3/uL — ABNORMAL HIGH (ref 4.0–10.5)
nRBC: 0.4 % — ABNORMAL HIGH (ref 0.0–0.2)

## 2021-06-20 LAB — RESP PANEL BY RT-PCR (FLU A&B, COVID) ARPGX2
Influenza A by PCR: NEGATIVE
Influenza B by PCR: NEGATIVE
SARS Coronavirus 2 by RT PCR: NEGATIVE

## 2021-06-20 LAB — PREPARE RBC (CROSSMATCH)

## 2021-06-20 LAB — PROTIME-INR
INR: 1.7 — ABNORMAL HIGH (ref 0.8–1.2)
Prothrombin Time: 19.7 seconds — ABNORMAL HIGH (ref 11.4–15.2)

## 2021-06-20 LAB — MRSA NEXT GEN BY PCR, NASAL: MRSA by PCR Next Gen: DETECTED — AB

## 2021-06-20 LAB — BRAIN NATRIURETIC PEPTIDE: B Natriuretic Peptide: 432.3 pg/mL — ABNORMAL HIGH (ref 0.0–100.0)

## 2021-06-20 LAB — PROCALCITONIN: Procalcitonin: 1.92 ng/mL

## 2021-06-20 LAB — LACTIC ACID, PLASMA
Lactic Acid, Venous: 1.4 mmol/L (ref 0.5–1.9)
Lactic Acid, Venous: 1.2 mmol/L (ref 0.5–1.9)

## 2021-06-20 LAB — RETICULOCYTES
Immature Retic Fract: 29 % — ABNORMAL HIGH (ref 2.3–15.9)
RBC.: 1.97 MIL/uL — ABNORMAL LOW (ref 3.87–5.11)
Retic Count, Absolute: 53.2 10*3/uL (ref 19.0–186.0)
Retic Ct Pct: 2.7 % (ref 0.4–3.1)

## 2021-06-20 LAB — VITAMIN B12: Vitamin B-12: 270 pg/mL (ref 180–914)

## 2021-06-20 LAB — APTT: aPTT: 42 seconds — ABNORMAL HIGH (ref 24–36)

## 2021-06-20 LAB — FERRITIN: Ferritin: 6 ng/mL — ABNORMAL LOW (ref 11–307)

## 2021-06-20 LAB — C-REACTIVE PROTEIN: CRP: 15.1 mg/dL — ABNORMAL HIGH (ref <1.0)

## 2021-06-20 LAB — FOLATE: Folate: 10.1 ng/mL (ref >5.9)

## 2021-06-20 MED ORDER — VANCOMYCIN HCL 1250 MG/250ML IV SOLN
1250.0000 mg | Freq: Once | INTRAVENOUS | Status: AC
  Administered 2021-06-20: 11:00:00 1250 mg via INTRAVENOUS
  Filled 2021-06-20: qty 250

## 2021-06-20 MED ORDER — SODIUM CHLORIDE 0.9 % IV BOLUS (SEPSIS)
1000.0000 mL | Freq: Once | INTRAVENOUS | Status: AC
  Administered 2021-06-20: 09:00:00 1000 mL via INTRAVENOUS

## 2021-06-20 MED ORDER — PROTHROMBIN COMPLEX CONC HUMAN 500 UNITS IV KIT: 2000.0000 [IU] | PACK | Status: DC

## 2021-06-20 MED ORDER — ALBUTEROL SULFATE (2.5 MG/3ML) 0.083% IN NEBU: 2.5000 mg | INHALATION_SOLUTION | RESPIRATORY_TRACT | Status: DC | PRN

## 2021-06-20 MED ORDER — HYDRALAZINE HCL 20 MG/ML IJ SOLN: 5.0000 mg | INTRAMUSCULAR | Status: DC | PRN

## 2021-06-20 MED ORDER — MOMETASONE FURO-FORMOTEROL FUM 100-5 MCG/ACT IN AERO
2.0000 | INHALATION_SPRAY | Freq: Two times a day (BID) | RESPIRATORY_TRACT | Status: DC
Start: 2021-06-20 — End: 2021-06-26
  Administered 2021-06-20 – 2021-06-26 (×12): 2 via RESPIRATORY_TRACT
  Filled 2021-06-20: qty 8.8

## 2021-06-20 MED ORDER — SODIUM CHLORIDE 0.9 % IV SOLN: 10.0000 mL/h | Freq: Once | INTRAVENOUS | Status: DC

## 2021-06-20 MED ORDER — ACETAMINOPHEN 325 MG PO TABS
650.0000 mg | ORAL_TABLET | Freq: Four times a day (QID) | ORAL | Status: DC | PRN
  Administered 2021-06-24 – 2021-06-26 (×5): 650 mg via ORAL
  Filled 2021-06-20 (×5): qty 2

## 2021-06-20 MED ORDER — ONDANSETRON HCL 4 MG/2ML IJ SOLN
4.0000 mg | Freq: Three times a day (TID) | INTRAMUSCULAR | Status: DC | PRN
  Administered 2021-06-20: 19:00:00 4 mg via INTRAVENOUS
  Filled 2021-06-20: qty 2

## 2021-06-20 MED ORDER — ROSUVASTATIN CALCIUM 10 MG PO TABS
10.0000 mg | ORAL_TABLET | Freq: Every day | ORAL | Status: DC
  Administered 2021-06-21 – 2021-06-26 (×6): 10 mg via ORAL
  Filled 2021-06-20 (×7): qty 1

## 2021-06-20 MED ORDER — VANCOMYCIN HCL IN DEXTROSE 1-5 GM/200ML-% IV SOLN
1000.0000 mg | Freq: Once | INTRAVENOUS | Status: AC
  Administered 2021-06-20: 13:00:00 1000 mg via INTRAVENOUS
  Filled 2021-06-20: qty 200

## 2021-06-20 MED ORDER — INSULIN ASPART 100 UNIT/ML IJ SOLN
0.0000 [IU] | Freq: Three times a day (TID) | INTRAMUSCULAR | Status: DC
  Administered 2021-06-20: 13:00:00 1 [IU] via SUBCUTANEOUS
  Administered 2021-06-23: 2 [IU] via SUBCUTANEOUS
  Administered 2021-06-23 – 2021-06-25 (×4): 1 [IU] via SUBCUTANEOUS
  Filled 2021-06-20 (×7): qty 1

## 2021-06-20 MED ORDER — METRONIDAZOLE 500 MG/100ML IV SOLN
500.0000 mg | Freq: Once | INTRAVENOUS | Status: AC
  Administered 2021-06-20: 10:00:00 500 mg via INTRAVENOUS
  Filled 2021-06-20: qty 100

## 2021-06-20 MED ORDER — SODIUM CHLORIDE 0.9 % IV SOLN
2.0000 g | INTRAVENOUS | Status: DC
  Administered 2021-06-20: 18:00:00 2 g via INTRAVENOUS
  Filled 2021-06-20 (×2): qty 20

## 2021-06-20 MED ORDER — ACETAMINOPHEN 500 MG PO TABS
1000.0000 mg | ORAL_TABLET | Freq: Once | ORAL | Status: AC
  Administered 2021-06-20: 09:00:00 1000 mg via ORAL

## 2021-06-20 MED ORDER — FUROSEMIDE 10 MG/ML IJ SOLN
40.0000 mg | Freq: Once | INTRAMUSCULAR | Status: AC
  Administered 2021-06-20: 19:00:00 40 mg via INTRAVENOUS
  Filled 2021-06-20: qty 4

## 2021-06-20 MED ORDER — CHLORHEXIDINE GLUCONATE CLOTH 2 % EX PADS
6.0000 | MEDICATED_PAD | Freq: Every day | CUTANEOUS | Status: DC
  Administered 2021-06-20 – 2021-06-22 (×3): 6 via TOPICAL

## 2021-06-20 MED ORDER — EMPTY CONTAINERS FLEXIBLE MISC
900.0000 mg | Freq: Once | Status: AC
  Administered 2021-06-20: 14:00:00 900 mg via INTRAVENOUS
  Filled 2021-06-20: qty 90

## 2021-06-20 MED ORDER — IOHEXOL 350 MG/ML SOLN
100.0000 mL | Freq: Once | INTRAVENOUS | Status: AC | PRN
  Administered 2021-06-20: 17:00:00 100 mL via INTRAVENOUS

## 2021-06-20 MED ORDER — SODIUM CHLORIDE 0.9% IV SOLUTION
Freq: Once | INTRAVENOUS | Status: DC
  Filled 2021-06-20: qty 250

## 2021-06-20 MED ORDER — VITAMIN K1 10 MG/ML IJ SOLN: 10.0000 mg | INTRAVENOUS | Status: DC

## 2021-06-20 MED ORDER — PANTOPRAZOLE INFUSION (NEW) - SIMPLE MED
8.0000 mg/h | INTRAVENOUS | Status: DC
  Administered 2021-06-20 – 2021-06-22 (×5): 8 mg/h via INTRAVENOUS
  Filled 2021-06-20 (×6): qty 100

## 2021-06-20 MED ORDER — DM-GUAIFENESIN ER 30-600 MG PO TB12: 1.0000 | ORAL_TABLET | Freq: Two times a day (BID) | ORAL | Status: DC | PRN

## 2021-06-20 MED ORDER — LACTATED RINGERS IV SOLN: INTRAVENOUS | Status: AC

## 2021-06-20 MED ORDER — PEG 3350-KCL-NA BICARB-NACL 420 G PO SOLR
4000.0000 mL | Freq: Once | ORAL | Status: AC
  Administered 2021-06-20: 18:00:00 4000 mL via ORAL
  Filled 2021-06-20: qty 4000

## 2021-06-20 MED ORDER — VANCOMYCIN HCL IN DEXTROSE 1-5 GM/200ML-% IV SOLN: 1000.0000 mg | Freq: Once | INTRAVENOUS | Status: DC

## 2021-06-20 MED ORDER — SODIUM CHLORIDE 0.9 % IV SOLN
2.0000 g | Freq: Once | INTRAVENOUS | Status: AC
  Administered 2021-06-20: 10:00:00 2 g via INTRAVENOUS
  Filled 2021-06-20: qty 2

## 2021-06-20 MED ORDER — PANTOPRAZOLE 80MG IVPB - SIMPLE MED
80.0000 mg | Freq: Once | INTRAVENOUS | Status: AC
  Administered 2021-06-20: 10:00:00 80 mg via INTRAVENOUS
  Filled 2021-06-20: qty 80

## 2021-06-20 MED ORDER — INSULIN ASPART 100 UNIT/ML IJ SOLN: 0.0000 [IU] | Freq: Every day | INTRAMUSCULAR | Status: DC

## 2021-06-20 MED ORDER — VANCOMYCIN HCL 1250 MG/250ML IV SOLN
1250.0000 mg | INTRAVENOUS | Status: DC
  Administered 2021-06-21 – 2021-06-25 (×5): 1250 mg via INTRAVENOUS
  Filled 2021-06-20 (×6): qty 250

## 2021-06-20 MED ORDER — ADULT MULTIVITAMIN W/MINERALS CH
1.0000 | ORAL_TABLET | Freq: Every day | ORAL | Status: DC
  Administered 2021-06-20 – 2021-06-26 (×7): 1 via ORAL
  Filled 2021-06-20 (×7): qty 1

## 2021-06-20 NOTE — ED Provider Notes (Signed)
[facility] Emergency Department Provider Note  Time seen: 8:21 AM  I have reviewed the triage vital signs and the nursing notes.   HISTORY  Chief Complaint Weakness   HPI Courtney Mack is a 65 y.o. female with a past medical history of diabetes, DVT, hyperlipidemia, hypertension, presents to the emergency department after a fall.  According to the patient she has been feeling very weak over the past 2 to 3 days along with cough and low-grade fever.  Patient states she fell this morning does not believe she hit her head but is experiencing a mild headache.  Patient denies any abdominal pain but does state some mild tightness in her chest as well as cough with some mild shortness of breath.   Past Medical History:  Diagnosis Date   Diabetes mellitus without complication ([place])    DVT (deep venous thrombosis) ([place]) 09/06/2020   High cholesterol    Hx of blood clots    Hypertension    IDA (iron deficiency anemia) 09/21/2020    Patient Active Problem List   Diagnosis Date Noted   Acute bilateral deep vein thrombosis (DVT) of femoral veins ([place]) 01/02/2021   DOE (dyspnea on exertion) 11/10/2020   Risk factors for obstructive sleep apnea 11/10/2020   Uterine cancer ([place]) 11/08/2020   IDA (iron deficiency anemia) 09/21/2020   Endometrial cancer ([place]) 09/13/2020   Acute blood loss anemia 09/06/2020   Vaginal bleeding 09/06/2020   DVT (deep venous thrombosis) ([place]) 09/06/2020   Acute massive pulmonary embolism ([place]) 08/25/2020   Anemia, unspecified 08/24/2020   Morbidly obese ([place]) 08/23/2020   Essential hypertension 08/23/2020   Diabetes mellitus without complication ([place]) 78/67/6720   Dyslipidemia 08/23/2020   Acute respiratory failure with hypoxia ([place])    Pulmonary embolism ([place]) 08/18/2020   Vaginal bleeding, abnormal 08/18/2020   Obesity, morbid, BMI 50 or higher ([place]) 08/18/2020    Past Surgical History:  Procedure Laterality Date   IVC FILTER  INSERTION N/A 08/18/2020   Procedure: IVC FILTER INSERTION;  Surgeon: Katha Cabal, MD;  Location: [facility];  Service: Cardiovascular;  Laterality: N/A;   PERIPHERAL VASCULAR THROMBECTOMY Bilateral 01/03/2021   Procedure: PERIPHERAL VASCULAR THROMBECTOMY;  Surgeon: Algernon Huxley, MD;  Location: [facility];  Service: Cardiovascular;  Laterality: Bilateral;   PULMONARY THROMBECTOMY N/A 08/18/2020   Procedure: PULMONARY THROMBECTOMY;  Surgeon: Katha Cabal, MD;  Location: [facility];  Service: Cardiovascular;  Laterality: N/A;   TUBAL LIGATION      Prior to Admission medications   Medication Sig Start Date End Date Taking? Authorizing Provider  DULERA 100-5 MCG/ACT AERO Inhale 2 puffs into the lungs 2 (two) times daily. 11/22/20   [provider]  losartan (COZAAR) 25 MG tablet Take 1 tablet (25 mg total) by mouth daily. 10/03/20   Earlie Server, MD  metFORMIN (GLUCOPHAGE) 500 MG tablet Take 1 tablet (500 mg total) by mouth 2 (two) times daily with a meal. 10/03/20   Earlie Server, MD  Multiple Vitamin (MULTIVITAMIN WITH MINERALS) TABS tablet Take 1 tablet by mouth daily. 09/12/20   [name], MD  ondansetron (ZOFRAN) 4 MG tablet Take 1 tablet (4 mg total) by mouth every 6 (six) hours as needed for nausea. 08/24/20   [name], MD  rivaroxaban (XARELTO) 20 MG TABS tablet Take 1 tablet (20 mg total) by mouth daily with supper. 01/25/21   Earlie Server, MD  rosuvastatin (CRESTOR) 10 MG tablet Take  1 tablet (10 mg total) by mouth daily. 10/03/20   Earlie Server, MD    No Known Allergies  Family History  Problem Relation Age of Onset   Hypertension Mother    Diabetes Mother    Diabetes Father    Hypertension Father    High Cholesterol Father    Congestive Heart Failure Father    Breast cancer Cousin     Social History Social History   Tobacco Use   Smoking status: Former    Packs/day: 0.25    Years: 0.50    Pack years: 0.13    Types: Cigarettes     Quit date: 09/21/1980    Years since quitting: 40.7   Smokeless tobacco: Never   Tobacco comments:    smoked 2-4 cigarettes for about 2-3 weeks   Vaping Use   Vaping Use: Never used  Substance Use Topics   Alcohol use: Not Currently   Drug use: Not Currently    Review of Systems Constitutional: Low-grade fever over the past 2 to 3 days.  Generalized fatigue Cardiovascular: Negative for chest pain. Respiratory: Mild shortness of breath at times.  Positive for cough x3 days Gastrointestinal: Negative for abdominal pain, vomiting Musculoskeletal: Negative for musculoskeletal complaints Neurological: Mild headache All other ROS negative  ____________________________________________   PHYSICAL EXAM:  VITAL SIGNS: ED Triage Vitals  Enc Vitals Group     BP 06/20/21 0758 (!) 103/43     Pulse Rate 06/20/21 0758 (!) 105     Resp 06/20/21 0758 (!) 26     Temp 06/20/21 0758 100.2 F (37.9 C)     Temp Source 06/20/21 0758 Oral     SpO2 06/20/21 0758 94 %     Weight 06/20/21 0756 229 lb 8 oz (104.1 kg)     Height --      Head Circumference --      Peak Flow --      Pain Score --      Pain Loc --      Pain Edu? --      Excl. in [place]? --     Constitutional: Alert and oriented. Well appearing and in no distress. Eyes: Normal exam ENT      Head: Normocephalic and atraumatic.      Mouth/Throat: Mucous membranes are moist. Cardiovascular: Normal rate, regular rhythm.  Respiratory: Normal respiratory effort without tachypnea nor retractions. Breath sounds are clear, but occasional cough during examination. Gastrointestinal: Soft and nontender. No distention.  Musculoskeletal: Nontender with normal range of motion in all extremities.  Neurologic:  Normal speech and language. No gross focal neurologic deficits  Skin:  Skin is warm, dry and intact.  Psychiatric: Mood and affect are normal.   ____________________________________________    EKG  EKG viewed and interpreted by  myself shows sinus tachycardia at 105 bpm with a narrow QRS, normal axis, normal intervals, no concerning ST changes.  ____________________________________________    RADIOLOGY  Chest x-ray clear  ____________________________________________   INITIAL IMPRESSION / ASSESSMENT AND PLAN / ED COURSE  Pertinent labs & imaging results that were available during my care of the patient were reviewed by me and considered in my medical decision making (see chart for details).   Patient presents emergency department for cough fever and a fall this morning due to weakness.  Patient is tachypneic at 33 breaths/min, tachycardic at 104 bpm, low-grade fever 100.2 meeting sepsis criteria.  We will check labs, cultures.  We will hold off on antibiotics until flu/COVID  has resulted.  We will obtain a chest x-ray to evaluate for pneumonia.  Given the patient's fall headache and anticoagulation we will obtain a CT scan of the head as a precaution.  We will dose IV fluids while awaiting lab results.  We will continue to closely monitor.  Patient agreeable to plan of care.  Labs have resulted showing a very low hemoglobin of 4.0.  Patient is quite pale in appearance.  Rectal examination shows dark black stool strongly guaiac positive.  I have consented the patient of blood products we will begin transfusion of 3 units.  We will start the patient on Protonix bolus and infusion.  Remainder of the work-up is nonrevealing.  Patient is on Xarelto.  ANISE HARBIN was evaluated in Emergency Department on 06/20/2021 for the symptoms described in the history of present illness. She was evaluated in the context of the global COVID-19 pandemic, which necessitated consideration that the patient might be at risk for infection with the SARS-CoV-2 virus that causes COVID-19. Institutional protocols and algorithms that pertain to the evaluation of patients at risk for COVID-19 are in a state of rapid change based on information  released by regulatory bodies including the CDC and federal and state organizations. These policies and algorithms were followed during the patient's care in the ED.  CRITICAL CARE Performed by: Harvest Dark   Total critical care time: 30 minutes  Critical care time was exclusive of separately billable procedures and treating other patients.  Critical care was necessary to treat or prevent imminent or life-threatening deterioration.  Critical care was time spent personally by me on the following activities: development of treatment plan with patient and/or surrogate as well as nursing, discussions with consultants, evaluation of patient's response to treatment, examination of patient, obtaining history from patient or surrogate, ordering and performing treatments and interventions, ordering and review of laboratory studies, ordering and review of radiographic studies, pulse oximetry and re-evaluation of patient's condition.  ____________________________________________   FINAL CLINICAL IMPRESSION(S) / ED DIAGNOSES  Cough Fever Fall Symptomatic anemia Weakness   Harvest Dark, MD 06/20/21 1439

## 2021-06-20 NOTE — Sepsis Progress Note (Signed)
eLink monitoring sepsis protocol °

## 2021-06-20 NOTE — Progress Notes (Signed)
Pharmacy Antibiotic Note  Courtney Mack is a 65 y.o. female admitted on 06/20/2021. Pharmacy has been consulted for vancomycin dosing for cellulitis.  Plan: Pt to receive vancomycin 2250 mg IV x 1 loading dose in ED. Will order vancomycin 1250 mg IV q24h Est AUC: 551 Used: Scr 0.8 (rounded up), IBW, Vd 0.5 Obtain vanc level around 4th or 5th dose if continued Follow up cultures   Weight: 104.1 kg (229 lb 8 oz)  Temp (24hrs), Avg:99.3 F (37.4 C), Min:98.3 F (36.8 C), Max:100.2 F (37.9 C)  Recent Labs  Lab 06/20/21 0811  WBC 14.2*  CREATININE 0.71  LATICACIDVEN 1.4    Estimated Creatinine Clearance: 73.8 mL/min (by C-G formula based on SCr of 0.71 mg/dL).    No Known Allergies  Antimicrobials this admission: 12/14 cefepime/metronidazole x1 12/14 vancomycin >>  12/14 ceftriaxone >>    Microbiology results: 12/14 BCx: sent  Thank you for allowing pharmacy to be a part of this patients care.  Forde Dandy Madalina Rosman 06/20/2021 11:21 AM

## 2021-06-20 NOTE — Sepsis Progress Note (Signed)
Notified provider of need to order antibiotics.   

## 2021-06-20 NOTE — ED Notes (Signed)
RN cleaned pt up from large bowel movement.

## 2021-06-20 NOTE — Progress Notes (Signed)
CODE SEPSIS - PHARMACY COMMUNICATION  **Broad Spectrum Antibiotics should be administered within 1 hour of Sepsis diagnosis**  Time Code Sepsis Called/Page Received: 0927  Antibiotics Ordered: cefepime, vancomycin, metronidazole  Time of 1st antibiotic administration: [place] ,PharmD Clinical Pharmacist  06/20/2021  9:35 AM

## 2021-06-20 NOTE — Consult Note (Signed)
[facility] Vascular Consult Note  MRN : [id number]  Courtney Mack is a 65 y.o. (05-01-1956) female who presents with chief complaint of  Chief Complaint  Patient presents with   Weakness   History of Present Illness:  The patient is a 65 year old female with a past medical history of diabetes type 2, history of DVT, hyperlipidemia, chronic diastolic congestive heart failure, cellulitis of the lower extremity, morbid obesity, endometrial cancer, essential hypertension, who presented to the [facility] emergency department complaining of progressively worsening weakness found on the floor by EMS.  Patient endorses a history of progressively worsening weakness and what sounds like a possible syncopal event as EMS found her on the floor.  She remembers feeling lightheaded and falling.  As per EMS, she was noted to be pale.  She denies any other symptoms such as shortness of breath or chest pain.  In the emergency department, she was found to have a hemoglobin of 3 6 and is currently getting transfused.  She denies any bright red blood per rectum or dark stools, blood in her urine, nausea or vomiting blood.  Patient does take Xarelto.  Vascular surgery was consulted in the setting of possible GI bleed and cellulitis by Dr. Blaine Hamper.   Current Facility-Administered Medications  Medication Dose Route Frequency Provider Last Rate Last Admin   0.9 %  sodium chloride infusion (Manually program via Guardrails IV Fluids)   Intravenous Once Ivor Costa, MD   Held at 06/20/21 1230   0.9 %  sodium chloride infusion  10 mL/hr Intravenous Once Ivor Costa, MD   Held at 06/20/21 1229   acetaminophen (TYLENOL) tablet 650 mg  650 mg Oral Q6H PRN Ivor Costa, MD       albuterol (PROVENTIL) (2.5 MG/3ML) 0.083% nebulizer solution 2.5 mg  2.5 mg Nebulization Q4H PRN Ivor Costa, MD       cefTRIAXone (ROCEPHIN) 2 g in sodium chloride 0.9 % 100 mL IVPB  2 g Intravenous  Q24H Ivor Costa, MD       coag fact Xa recombinant (ANDEXXA) low dose infusion 900 mg  900 mg Intravenous Once Ivor Costa, MD       dextromethorphan-guaiFENesin ([place] DM) 30-600 MG per 12 hr tablet 1 tablet  1 tablet Oral BID PRN Ivor Costa, MD       hydrALAZINE (APRESOLINE) injection 5 mg  5 mg Intravenous Q2H PRN Ivor Costa, MD       insulin aspart (novoLOG) injection 0-5 Units  0-5 Units Subcutaneous QHS Ivor Costa, MD       insulin aspart (novoLOG) injection 0-9 Units  0-9 Units Subcutaneous TID WC Ivor Costa, MD   1 Units at 06/20/21 1231   lactated ringers infusion   Intravenous Continuous Ivor Costa, MD 75 mL/hr at 06/20/21 1013 New Bag at 06/20/21 1013   mometasone-formoterol (DULERA) 100-5 MCG/ACT inhaler 2 puff  2 puff Inhalation BID Ivor Costa, MD       multivitamin with minerals tablet 1 tablet  1 tablet Oral Daily Ivor Costa, MD       ondansetron [facility]) injection 4 mg  4 mg Intravenous Q8H PRN Ivor Costa, MD       pantoprozole (PROTONIX) 80 mg /NS 100 mL infusion  8 mg/hr Intravenous Continuous Ivor Costa, MD       rosuvastatin (CRESTOR) tablet 10 mg  10 mg Oral Daily Ivor Costa, MD       vancomycin (VANCOCIN) IVPB 1000 mg/200 mL premix  1,000 mg Intravenous Once Ivor Costa, MD 200 mL/hr at 06/20/21 1254 1,000 mg at 06/20/21 1254   [START ON 06/21/2021] vancomycin (VANCOREADY) IVPB 1250 mg/250 mL  1,250 mg Intravenous Q24H Rauer, Forde Dandy, RPH       Current Outpatient Medications  Medication Sig Dispense Refill   DULERA 100-5 MCG/ACT AERO Inhale 2 puffs into the lungs 2 (two) times daily.     famotidine (PEPCID) 20 MG tablet Take 20 mg by mouth 2 (two) times daily as needed.     losartan (COZAAR) 25 MG tablet Take 1 tablet (25 mg total) by mouth daily. 15 tablet 0   metFORMIN (GLUCOPHAGE) 500 MG tablet Take 1 tablet (500 mg total) by mouth 2 (two) times daily with a meal. 30 tablet 0   Multiple Vitamin (MULTIVITAMIN WITH MINERALS) TABS tablet Take 1 tablet by mouth daily.      rivaroxaban (XARELTO) 20 MG TABS tablet Take 1 tablet (20 mg total) by mouth daily with supper. 90 tablet 1   rosuvastatin (CRESTOR) 10 MG tablet Take 1 tablet (10 mg total) by mouth daily. 15 tablet 0   ondansetron (ZOFRAN) 4 MG tablet Take 1 tablet (4 mg total) by mouth every 6 (six) hours as needed for nausea. (Patient not taking: Reported on 06/20/2021) 20 tablet 0   Past Medical History:  Diagnosis Date   Diabetes mellitus without complication ([facility])    DVT (deep venous thrombosis) ([place]) 09/06/2020   High cholesterol    Hx of blood clots    Hypertension    IDA (iron deficiency anemia) 09/21/2020   Past Surgical History:  Procedure Laterality Date   IVC FILTER INSERTION N/A 08/18/2020   Procedure: IVC FILTER INSERTION;  Surgeon: Katha Cabal, MD;  Location: [place] [facility];  Service: Cardiovascular;  Laterality: N/A;   PERIPHERAL VASCULAR THROMBECTOMY Bilateral 01/03/2021   Procedure: PERIPHERAL VASCULAR THROMBECTOMY;  Surgeon: Algernon Huxley, MD;  Location: [place] [facility];  Service: Cardiovascular;  Laterality: Bilateral;   PULMONARY THROMBECTOMY N/A 08/18/2020   Procedure: PULMONARY THROMBECTOMY;  Surgeon: Katha Cabal, MD;  Location: [place] [facility];  Service: Cardiovascular;  Laterality: N/A;   TUBAL LIGATION     Social History Social History   Tobacco Use   Smoking status: Former    Packs/day: 0.25    Years: 0.50    Pack years: 0.13    Types: Cigarettes    Quit date: 09/21/1980    Years since quitting: 40.7   Smokeless tobacco: Never   Tobacco comments:    smoked 2-4 cigarettes for about 2-3 weeks   Vaping Use   Vaping Use: Never used  Substance Use Topics   Alcohol use: Not Currently   Drug use: Not Currently   Family History Family History  Problem Relation Age of Onset   Hypertension Mother    Diabetes Mother    Diabetes Father    Hypertension Father    High Cholesterol Father    Congestive Heart Failure Father    Breast cancer  Cousin   Denies family history of peripheral artery disease, venous disease or renal disease.  No Known Allergies  REVIEW OF SYSTEMS (Negative unless checked)  Constitutional: [] Weight loss  [] Fever  [] Chills Cardiac: [] Chest pain   [] Chest pressure   [] Palpitations   [] Shortness of breath when laying flat   [] Shortness of breath at rest   [] Shortness of breath with exertion. Vascular:  [] Pain in legs with walking   [] Pain in legs at  rest   [] Pain in legs when laying flat   [] Claudication   [] Pain in feet when walking  [] Pain in feet at rest  [] Pain in feet when laying flat   [x] History of DVT   [] Phlebitis   [x] Swelling in legs   [] Varicose veins   [] Non-healing ulcers Pulmonary:   [] Uses home oxygen   [] Productive cough   [] Hemoptysis   [] Wheeze  [] COPD   [] Asthma Neurologic:  [] Dizziness  [] Blackouts   [] Seizures   [] History of stroke   [] History of TIA  [] Aphasia   [] Temporary blindness   [] Dysphagia   [x] Weakness or numbness in arms   [x] Weakness or numbness in legs Musculoskeletal:  [] Arthritis   [] Joint swelling   [] Joint pain   [] Low back pain Hematologic:  [] Easy bruising  [] Easy bleeding   [] Hypercoagulable state   [x] Anemic  [] Hepatitis Gastrointestinal:  [] Blood in stool   [] Vomiting blood  [] Gastroesophageal reflux/heartburn   [] Difficulty swallowing. Genitourinary:  [] Chronic kidney disease   [] Difficult urination  [] Frequent urination  [] Burning with urination   [] Blood in urine Skin:  [] Rashes   [] Ulcers   [] Wounds Psychological:  [] History of anxiety   []  History of major depression.  Physical Examination  Vitals:   06/20/21 1145 06/20/21 1200 06/20/21 1215 06/20/21 1230  BP:  (!) 108/44  (!) 109/45  Pulse: 97 97 90 88  Resp: (!) 31 (!) 25 (!) 33 (!) 29  Temp:      TempSrc:      SpO2: 96% 96% 95% 97%  Weight:       Body mass index is 46.35 kg/m. Gen: Morbidly obese female, WD/WN, NAD Head: [place]/AT, No temporalis wasting. Prominent temp pulse not  noted. Ear/Nose/Throat: Hearing grossly intact, nares w/o erythema or drainage, oropharynx w/o Erythema/Exudate Eyes: Sclera non-icteric, conjunctiva clear Neck: Trachea midline.  No JVD.  Pulmonary:  Good air movement, respirations not labored, equal bilaterally.  Cardiac: RRR, normal S1, S2. Vascular:  Vessel Right Left  Radial Palpable Palpable  Ulnar Palpable Palpable   Bilateral lower extremity: Moderate edema.  Hard to palpate pedal pulses due to body habitus.  Gastrointestinal: soft, non-tender/non-distended. No guarding/reflex.  Musculoskeletal: M/S 5/5 throughout.  Extremities without ischemic changes.  No deformity or atrophy. No edema. Neurologic: Sensation grossly intact in extremities.  Symmetrical.  Speech is fluent. Motor exam as listed above. Psychiatric: Judgment intact, Mood & affect appropriate for pt's clinical situation. Dermatologic: No rashes or ulcers noted.  No cellulitis or open wounds. Lymph : No Cervical, Axillary, or Inguinal lymphadenopathy.  CBC Lab Results  Component Value Date   WBC 13.8 (H) 06/20/2021   HGB 3.6 (LL) 06/20/2021   HCT 13.4 (LL) 06/20/2021   MCV 68.0 (L) 06/20/2021   PLT 237 06/20/2021   BMET    Component Value Date/Time   NA 136 06/20/2021 0811   K 3.6 06/20/2021 0811   CL 107 06/20/2021 0811   CO2 23 06/20/2021 0811   GLUCOSE 122 (H) 06/20/2021 0811   BUN 14 06/20/2021 0811   CREATININE 0.71 06/20/2021 0811   CALCIUM 8.6 (L) 06/20/2021 0811   GFRNONAA >60 06/20/2021 0811   Estimated Creatinine Clearance: 73.8 mL/min (by C-G formula based on SCr of 0.71 mg/dL).  COAG Lab Results  Component Value Date   INR 1.7 (H) 06/20/2021   INR 1.2 01/02/2021   INR 2.6 (H) 09/20/2020   Radiology CT HEAD WO CONTRAST (5MM)  Result Date: 06/20/2021 CLINICAL DATA:  Head trauma, minor (Age >= 65y) EXAM: CT  HEAD WITHOUT CONTRAST TECHNIQUE: Contiguous axial images were obtained from the base of the skull through the vertex without  intravenous contrast. COMPARISON:  CT head 01/29/2007. FINDINGS: Brain: No evidence of acute large vascular territory infarction, hemorrhage, hydrocephalus, extra-axial collection or mass lesion/mass effect. Small hypodensity in the inferior right basal ganglia, likely a benign dilated perivascular space given characteristic location. Mild atrophy. Vascular: No hyperdense vessel identified. Calcific intracranial atherosclerosis. Skull: No acute fracture. Sinuses/Orbits: Moderate mucosal thickening of the right maxillary sinus. Mild ethmoid air cell mucosal thickening. Unremarkable orbits. Other: No mastoid effusions.  Cerumen in the right EAC. IMPRESSION: No evidence of acute intracranial abnormality. Electronically Signed   By: Margaretha Sheffield M.D.   On: 06/20/2021 09:27   DG Chest Port 1 View  Result Date: 06/20/2021 CLINICAL DATA:  suspected sepsis EXAM: PORTABLE CHEST 1 VIEW COMPARISON:  01/02/2021 FINDINGS: The heart is mildly enlarged. No pulmonary vascular congestion. Lungs are clear. IMPRESSION: Mild cardiomegaly. Electronically Signed   By: Miachel Roux M.D.   On: 06/20/2021 08:45    Assessment/Plan The patient is a 65 year old female with a past medical history of diabetes type 2, history of DVT, hyperlipidemia, chronic diastolic congestive heart failure, cellulitis of the lower extremity, morbid obesity, endometrial cancer, essential hypertension, who presented to the [facility] emergency department complaining of progressively worsening weakness found on the floor by EMS.  1.  Severe anemia: Patient presented via EMS to the [facility] emergency department with a chief complaint of progressively worsening weakness and subsequent syncopal event found to have a hemoglobin of 3.6.  Patient is on Xarelto.  She denies any bright red blood per rectum, dark-colored stools, blood in her urine, vomiting blood.  In an attempt to rule out gastrointestinal  bleeding we will order a CTA abdomen and pelvis to assess for target for possible embolization.  Patient understands that if her able to find a target we would move ahead with embolization and attempt to achieve hemostasis.  Procedure, risks and benefits were explained to the patient.  All questions were answered.  If she is found to have a target the patient agrees to embolization.  If not, she will undergo endoscopic and colonoscopy with gastroenterology tomorrow.  2.  Bilateral lower extremity edema / cellulitis: Patient is a morbidly obese, sedentary female who on physical exam has moderate bilateral lower extremity edema and subsequent cellulitis.  Patient does not engage in conservative measures including wearing medical grade 1 compression socks, elevating her legs heart level or higher, and remaining active.  Due to her limited mobility compliance with conservative measures is highly unlikely.  Would be happy to do a formal work-up as an outpatient.  3.  Diabetes: On appropriate medications. Encouraged good control as its slows the progression of atherosclerotic disease   4.  Hyperlipidemia: On statin for medical management Encouraged good control as its slows the progression of atherosclerotic disease   Discussed with Dr. Mayme Genta, PA-C 06/20/2021 1:24 PM  This note was created with Dragon medical transcription system.  Any error is purely unintentional.

## 2021-06-20 NOTE — ED Notes (Signed)
RN gave report to [facility] in the ICU.

## 2021-06-20 NOTE — H&P (Signed)
History and Physical    Courtney Mack GMW:[id number] DOB: 17-Dec-1955 DOA: 06/20/2021  Referring MD/NP/PA:   PCP: [facility] [place]   Patient coming from:  The patient is coming from home.  At baseline, pt is independent for most of ADL.        Chief Complaint: Dark stool, weakness, cough, fever, left lower leg pain  HPI: Courtney Mack is a 65 y.o. female with medical history significant of DVT/PE on Xarelto, hypertension, hyperlipidemia, diabetes mellitus, anemia, obesity, endometrial cancer, dCHF, morbid obesity, who presents with weakness, cough, fever and left lower  leg pain.  Patient states that she has intermittent dark stool which has been going on for several weeks.  She developed generalized weakness. She states that she felt lightheadedness and fell on the floor this morning.  No loss of consciousness. No head or neck injury. She also reports left lower leg pain. Her left lower leg is mild erythematous.  Patient has fever and chills.  Patient reports cough and mild shortness breath.  Denies chest pain.  No nausea, vomiting, diarrhea or abdominal pain.  Denies symptoms of UTI.  Patient moves all extremities.  ED Course: pt was found to have Hgb 10.7 on 01/15/2021 --> 4.0 --> 3.6, positive FOBT.  WBC 14.2, INR 1.7, PTT 42, negative COVID PCR, renal function okay, temperature 100.2, blood pressure 106/45, heart rate 105, RR 33, oxygen saturation 94% on room air.  Chest x-ray showed mild cardiomegaly without infiltration.  CT of head is negative for acute intracranial abnormalities.  Dr. Alice Reichert of GI, Dr. Lucky Cowboy of vascular surgery, Dr. Mortimer Fries of ICU are consulted.  Review of Systems:   General: no fevers, chills, no body weight gain, has fatigue HEENT: no blurry vision, hearing changes or sore throat Respiratory: has dyspnea, coughing, no wheezing CV: no chest pain, no palpitations GI: no nausea, vomiting, abdominal pain, diarrhea, constipation. Has dark  stool GU: no dysuria, burning on urination, increased urinary frequency, hematuria  Ext: has leg edema Neuro: no unilateral weakness, numbness, or tingling, no vision change or hearing loss. Has fall. Skin: no rash, no skin tear. MSK: No muscle spasm, no deformity, no limitation of range of movement in spin Heme: No easy bruising.  Travel history: No recent long distant travel.  Allergy: No Known Allergies  Past Medical History:  Diagnosis Date   Diabetes mellitus without complication ([place])    DVT (deep venous thrombosis) (HCC) 09/06/2020   High cholesterol    Hx of blood clots    Hypertension    IDA (iron deficiency anemia) 09/21/2020    Past Surgical History:  Procedure Laterality Date   IVC FILTER INSERTION N/A 08/18/2020   Procedure: IVC FILTER INSERTION;  Surgeon: Katha Cabal, MD;  Location: [facility];  Service: Cardiovascular;  Laterality: N/A;   PERIPHERAL VASCULAR THROMBECTOMY Bilateral 01/03/2021   Procedure: PERIPHERAL VASCULAR THROMBECTOMY;  Surgeon: Algernon Huxley, MD;  Location: [facility];  Service: Cardiovascular;  Laterality: Bilateral;   PULMONARY THROMBECTOMY N/A 08/18/2020   Procedure: PULMONARY THROMBECTOMY;  Surgeon: Katha Cabal, MD;  Location: [facility];  Service: Cardiovascular;  Laterality: N/A;   TUBAL LIGATION      Social History:  reports that she quit smoking about 40 years ago. Her smoking use included cigarettes. She has a 0.13 pack-year smoking history. She has never used smokeless tobacco. She reports that she does not currently use alcohol. She reports that she does not  currently use drugs.  Family History:  Family History  Problem Relation Age of Onset   Hypertension Mother    Diabetes Mother    Diabetes Father    Hypertension Father    High Cholesterol Father    Congestive Heart Failure Father    Breast cancer Cousin      Prior to Admission medications   Medication Sig Start Date End Date Taking?  Authorizing Provider  DULERA 100-5 MCG/ACT AERO Inhale 2 puffs into the lungs 2 (two) times daily. 11/22/20   [provider]  losartan (COZAAR) 25 MG tablet Take 1 tablet (25 mg total) by mouth daily. 10/03/20   Earlie Server, MD  metFORMIN (GLUCOPHAGE) 500 MG tablet Take 1 tablet (500 mg total) by mouth 2 (two) times daily with a meal. 10/03/20   Earlie Server, MD  Multiple Vitamin (MULTIVITAMIN WITH MINERALS) TABS tablet Take 1 tablet by mouth daily. 09/12/20   [name], MD  ondansetron (ZOFRAN) 4 MG tablet Take 1 tablet (4 mg total) by mouth every 6 (six) hours as needed for nausea. 08/24/20   [name], MD  rivaroxaban (XARELTO) 20 MG TABS tablet Take 1 tablet (20 mg total) by mouth daily with supper. 01/25/21   Earlie Server, MD  rosuvastatin (CRESTOR) 10 MG tablet Take 1 tablet (10 mg total) by mouth daily. 10/03/20   Earlie Server, MD    Physical Exam: Vitals:   06/20/21 1615 06/20/21 1622 06/20/21 1822 06/20/21 1842  BP:  (!) 112/44 (!) 90/27 107/68  Pulse: 84 87 85 84  Resp: (!) 29 18 (!) 23 (!) 24  Temp:  98.5 F (36.9 C) 98.5 F (36.9 C) 98.9 F (37.2 C)  TempSrc:  Oral Oral Oral  SpO2: 98%  100% 99%  Weight:       General: Not in acute distress. Pale looking HEENT:       Eyes: PERRL, EOMI, no scleral icterus.       ENT: No discharge from the ears and nose, no pharynx injection, no tonsillar enlargement.        Neck: No JVD, no bruit, no mass felt. Heme: No neck lymph node enlargement. Cardiac: S1/S2, RRR, No murmurs, No gallops or rubs. Respiratory: No rales, wheezing, rhonchi or rubs. GI: Soft, nondistended, nontender, no rebound pain, no organomegaly, BS present. GU: No hematuria Ext: has mild bilateral lower leg edema.  Left lower leg has mild erythema, tenderness no warmth.     Musculoskeletal: No joint deformities, No joint redness or warmth, no limitation of ROM in spin. Skin: No rashes.  Neuro: Alert, oriented X3, cranial nerves II-XII grossly intact, moves  all extremities Psych: Patient is not psychotic, no suicidal or hemocidal ideation.  Labs on Admission: I have personally reviewed following labs and imaging studies  CBC: Recent Labs  Lab 06/20/21 0811 06/20/21 1049  WBC 14.2* 13.8*  NEUTROABS 12.9*  --   HGB 4.0* 3.6*  HCT 14.9* 13.4*  MCV 66.8* 68.0*  PLT 282 782   Basic Metabolic Panel: Recent Labs  Lab 06/20/21 0811  NA 136  K 3.6  CL 107  CO2 23  GLUCOSE 122*  BUN 14  CREATININE 0.71  CALCIUM 8.6*   GFR: Estimated Creatinine Clearance: 73.8 mL/min (by C-G formula based on SCr of 0.71 mg/dL). Liver Function Tests: Recent Labs  Lab 06/20/21 0811  AST 14*  ALT 8  ALKPHOS 49  BILITOT 1.8*  PROT 6.6  ALBUMIN 2.8*   No results for input(s):  LIPASE, AMYLASE in the last 168 hours. No results for input(s): AMMONIA in the last 168 hours. Coagulation Profile: Recent Labs  Lab 06/20/21 0811  INR 1.7*   Cardiac Enzymes: No results for input(s): CKTOTAL, CKMB, CKMBINDEX, TROPONINI in the last 168 hours. BNP (last 3 results) No results for input(s): PROBNP in the last 8760 hours. HbA1C: No results for input(s): HGBA1C in the last 72 hours. CBG: Recent Labs  Lab 06/20/21 1227 06/20/21 1756  GLUCAP 137* 146*   Lipid Profile: No results for input(s): CHOL, HDL, LDLCALC, TRIG, CHOLHDL, LDLDIRECT in the last 72 hours. Thyroid Function Tests: No results for input(s): TSH, T4TOTAL, FREET4, T3FREE, THYROIDAB in the last 72 hours. Anemia Panel: Recent Labs    06/20/21 1049  VITAMINB12 270  FOLATE 10.1  FERRITIN 6*  TIBC 339  IRON 13*  RETICCTPCT 2.7   Urine analysis: No results found for: COLORURINE, APPEARANCEUR, LABSPEC, PHURINE, GLUCOSEU, HGBUR, BILIRUBINUR, KETONESUR, PROTEINUR, UROBILINOGEN, NITRITE, LEUKOCYTESUR Sepsis Labs: @LABRCNTIP (procalcitonin:4,lacticidven:4) ) Recent Results (from the past 240 hour(s))  Resp Panel by RT-PCR (Flu A&B, Covid) Nasopharyngeal Swab     Status: None    Collection Time: 06/20/21  8:14 AM   Specimen: Nasopharyngeal Swab; Nasopharyngeal(NP) swabs in vial transport medium  Result Value Ref Range Status   SARS Coronavirus 2 by RT PCR NEGATIVE NEGATIVE Final    Comment: (NOTE) SARS-CoV-2 target nucleic acids are NOT DETECTED.  The SARS-CoV-2 RNA is generally detectable in upper respiratory specimens during the acute phase of infection. The lowest concentration of SARS-CoV-2 viral copies this assay can detect is 138 copies/mL. A negative result does not preclude SARS-Cov-2 infection and should not be used as the sole basis for treatment or other patient management decisions. A negative result may occur with  improper specimen collection/handling, submission of specimen other than nasopharyngeal swab, presence of viral mutation(s) within the areas targeted by this assay, and inadequate number of viral copies(<138 copies/mL). A negative result must be combined with clinical observations, patient history, and epidemiological information. The expected result is Negative.  Fact Sheet for Patients:  EntrepreneurPulse.com.au  Fact Sheet for Healthcare Providers:  IncredibleEmployment.be  This test is no t yet approved or cleared by the [place] FDA and  has been authorized for detection and/or diagnosis of SARS-CoV-2 by FDA under an Emergency Use Authorization (EUA). This EUA will remain  in effect (meaning this test can be used) for the duration of the COVID-19 declaration under Section 564(b)(1) of the Act, 21 U.S.C.section 360bbb-3(b)(1), unless the authorization is terminated  or revoked sooner.       Influenza A by PCR NEGATIVE NEGATIVE Final   Influenza B by PCR NEGATIVE NEGATIVE Final    Comment: (NOTE) The Xpert Xpress SARS-CoV-2/FLU/RSV plus assay is intended as an aid in the diagnosis of influenza from Nasopharyngeal swab specimens and should not be used as a sole basis for treatment.  Nasal washings and aspirates are unacceptable for Xpert Xpress SARS-CoV-2/FLU/RSV testing.  Fact Sheet for Patients: EntrepreneurPulse.com.au  Fact Sheet for Healthcare Providers: IncredibleEmployment.be  This test is not yet approved or cleared by the [place] FDA and has been authorized for detection and/or diagnosis of SARS-CoV-2 by FDA under an Emergency Use Authorization (EUA). This EUA will remain in effect (meaning this test can be used) for the duration of the COVID-19 declaration under Section 564(b)(1) of the Act, 21 U.S.C. section 360bbb-3(b)(1), unless the authorization is terminated or revoked.  Performed at [facility], [address] [place],  [place] [address]      Radiological Exams on Admission: CT HEAD WO CONTRAST (5MM)  Result Date: 06/20/2021 CLINICAL DATA:  Head trauma, minor (Age >= 65y) EXAM: CT HEAD WITHOUT CONTRAST TECHNIQUE: Contiguous axial images were obtained from the base of the skull through the vertex without intravenous contrast. COMPARISON:  CT head 01/29/2007. FINDINGS: Brain: No evidence of acute large vascular territory infarction, hemorrhage, hydrocephalus, extra-axial collection or mass lesion/mass effect. Small hypodensity in the inferior right basal ganglia, likely a benign dilated perivascular space given characteristic location. Mild atrophy. Vascular: No hyperdense vessel identified. Calcific intracranial atherosclerosis. Skull: No acute fracture. Sinuses/Orbits: Moderate mucosal thickening of the right maxillary sinus. Mild ethmoid air cell mucosal thickening. Unremarkable orbits. Other: No mastoid effusions.  Cerumen in the right EAC. IMPRESSION: No evidence of acute intracranial abnormality. Electronically Signed   By: Margaretha Sheffield M.D.   On: 06/20/2021 09:27   DG Chest Port 1 View  Result Date: 06/20/2021 CLINICAL DATA:  suspected sepsis EXAM: PORTABLE CHEST 1 VIEW COMPARISON:   01/02/2021 FINDINGS: The heart is mildly enlarged. No pulmonary vascular congestion. Lungs are clear. IMPRESSION: Mild cardiomegaly. Electronically Signed   By: Miachel Roux M.D.   On: 06/20/2021 08:45   CT Angio Abd/Pel w/ and/or w/o  Result Date: 06/20/2021 CLINICAL DATA:  Anemia, epigastric abdominal pain EXAM: CTA ABDOMEN AND PELVIS WITHOUT AND WITH CONTRAST TECHNIQUE: Multidetector CT imaging of the abdomen and pelvis was performed using the standard protocol during bolus administration of intravenous contrast. Multiplanar reconstructed images and MIPs were obtained and reviewed to evaluate the vascular anatomy. CONTRAST:  183mL OMNIPAQUE IOHEXOL 350 MG/ML SOLN COMPARISON:  None. FINDINGS: VASCULAR Aorta: Normal caliber aorta without aneurysm, dissection, vasculitis or significant stenosis. Diffuse atherosclerosis. Celiac: Patent without evidence of aneurysm, dissection, vasculitis or significant stenosis. Mild atherosclerosis. SMA: Patent without evidence of aneurysm, dissection, vasculitis or significant stenosis. Moderate atherosclerosis at the origin of the SMA, with less than 50% stenosis. Renals: Both renal arteries are patent without evidence of aneurysm, dissection, vasculitis, fibromuscular dysplasia or significant stenosis. Moderate atherosclerosis. IMA: Patent without evidence of aneurysm, dissection, vasculitis or significant stenosis. Inflow: Patent without evidence of aneurysm, dissection, vasculitis or significant stenosis. Proximal Outflow: Bilateral common femoral and visualized portions of the superficial and profunda femoral arteries are patent without evidence of aneurysm, dissection, vasculitis or significant stenosis. Veins: IVC filter. Decreased caliber of the bilateral common iliac veins may reflect chronic veno-occlusive disease. Numerous venous collaterals are seen within the abdominal wall and retroperitoneum. Review of the MIP images confirms the above findings. NON-VASCULAR  Lower chest: No acute pleural or parenchymal lung disease. Hepatobiliary: No focal liver abnormality is seen. No gallstones, gallbladder wall thickening, or biliary dilatation. Pancreas: Unremarkable. No pancreatic ductal dilatation or surrounding inflammatory changes. Spleen: Centripetal enhancement of a hypodensity in the superior aspect of the spleen compatible with hemangioma. No evidence of splenomegaly. Adrenals/Urinary Tract: Adrenal glands are unremarkable. Kidneys are normal, without renal calculi, focal lesion, or hydronephrosis. Bladder is decompressed, limiting its evaluation. Stomach/Bowel: Dense stool throughout the colon limits evaluation for active gastrointestinal bleeding. I do not see any intraluminal accumulation of contrast within the bowel to suggest active hemorrhage. No bowel obstruction or ileus. Mild wall thickening of the distal rectum with perirectal fat stranding may reflect proctitis. Scattered diverticulosis within the colon without evidence of diverticulitis. Lymphatic: Borderline enlarged retroperitoneal lymph nodes are identified, measuring up to 10 mm in the left para-aortic region. No other pathologic adenopathy. Reproductive: Uterus and bilateral adnexa are  unremarkable. Other: No free fluid or free intraperitoneal gas. Mild subcutaneous edema throughout the lower anterior abdominal wall and proximal lower extremities. Musculoskeletal: Severe bilateral hip osteoarthritis. No acute or destructive bony lesions. Reconstructed images demonstrate no additional findings. IMPRESSION: VASCULAR 1. No evidence of active gastrointestinal bleeding. Limited evaluation of the colon due to dense stool. 2.  Aortic Atherosclerosis (ICD10-I70.0). NON-VASCULAR 1. Mild distal rectal wall thickening and perirectal fat stranding which could reflect proctitis. 2. Scattered colonic diverticulosis without diverticulitis. 3. Borderline enlarged retroperitoneal lymph nodes, measuring up to 10 mm,  nonspecific. 4. Decreased caliber of the bilateral iliac veins and distal IVC below the IVC filter, consistent with chronic occlusion. 5.  Aortic Atherosclerosis (ICD10-I70.0). Electronically Signed   By: Randa Ngo M.D.   On: 06/20/2021 18:08     EKG: I have personally reviewed.  Sinus rhythm, QTC 426, low voltage, early R wave progression  Assessment/Plan Principal Problem:   GI bleeding Active Problems:   Pulmonary embolism (HCC)   Diabetes mellitus without complication (HCC)   Acute blood loss anemia   DVT (deep venous thrombosis) (HCC)   Symptomatic anemia   Sepsis (HCC)   HLD (hyperlipidemia)   Fall at home, initial encounter   Cough   Chronic diastolic CHF (congestive heart failure) (HCC)   Cellulitis of left lower leg   GI bleeding, acute blood loss anemia and symptomatic anemia: Hgb dropped from 10.7 --> 4.0 --> 3.6. consulted Dr. Alice Reichert of GI. Also consulted Dr. Lucky Cowboy of [facility]. Initially  NM blood loss scan is ordered, but since pt dose not have active bleeding, Dr. Lucky Cowboy d/c'ed NM blood loss scan test. Consulted Dr.Kasa of PCCM  - will admitted to SDU as inpatient - give one dose of Kcentra to reverse Xarelto - hold Xarelto - transfuse 4 units of blood now --> will give IV lasix prn if pt develops shortness of breath - IVF: 1L NS bolus - Start IV pantoprazole gtt - Zofran IV for nausea - Avoid NSAIDs and SQ heparin - Maintain IV access (2 large bore IVs if possible). - Monitor closely and follow q6h cbc, transfuse as necessary, if Hgb<7.0 - LaB: INR, PTT and type screen  Hx of Pulmonary embolism and DVT: pt is on Xarelto. Pt is s/p of IVC filter on 08/18/20. Consulted Dr. Lucky Cowboy of [facility]. -f/u Dr. Bunnie [facility] recommendation  Diabetes mellitus without complication [facility]): Recent A1c 6.2, well controlled.  Patient taking metformin at home -Sliding scale insulin  Sepsis due to Cellulitis of left lower leg: Patient admits critical for sepsis with WBC 14.2, tachycardia with heart  rate 105, RR 33, patient also has a fever 100.2. -Vancomycin, rocephin (patient received 1 dose of cefepime and Flagyl in ED) -Follow-up of blood culture -will get Procalcitonin and trend lactic acid levels per sepsis protocol. -IVF: 1L of NS bolus  HLD (hyperlipidemia) -Crestor  Fall at home, initial encounter: CT head negative. -pt/ot when able to (not ordered)  Cough: Chest x-ray negative. -Supportive care, as needed Mucinex and albuterol  HTN: -hold Cozaar since patient is at high risk of developing hypotension due to severe anemia -IV hydralazine as needed  Chronic diastolic CHF (congestive heart failure) ([place]): 2D echo on 08/19/2020 showed EF 60 to 65% with grade 1 diastolic dysfunction.  Chest x-ray is negative for pulmonary edema, does not seem to have CHF exacerbation. Scherrie Merritts give as needed Lasix while patient is getting blood transfusion -check BNP      DVT ppx: None (due to GI bleeding, cannot  use Lovenox or heparin; due to history of DVT, cannot use SCD) Code Status: Full code Family Communication:  Yes, patient's brother by phone Disposition Plan:  Anticipate discharge back to previous environment Consults called:  Dr. Alice Reichert of GI, Dr. Lucky Cowboy of vascular surgery, Dr. Mortimer Fries of ICU are consulted. Admission status and Level of care: Stepdown:  as inpt      SDU/inpation         Status is: Inpatient  Remains inpatient appropriate because: Patient has multiple comorbidities, now presents with severe symptomatic anemia due to GI bleeding, hemoglobin dropped from 10.7 to 3.6.  Patient also has sepsis due to left lower leg cellulitis.  Her presentation is highly complicated.  Patient is at high risk of deteriorating.  Need to be treated in hospital for at least 2 days.            Date of Service 06/20/2021    Ivor Costa [facility]   If 7PM-7AM, please contact night-coverage [email or web address] 06/20/2021, 6:50 PM

## 2021-06-20 NOTE — Progress Notes (Signed)
ANTICOAGULATION CONSULT NOTE - Initial Consult  Pharmacy Consult for Andexxa Indication: DOAC reversal  No Known Allergies  Patient Measurements: Weight: 104.1 kg (229 lb 8 oz)  Vital Signs: Temp: 98.3 F (36.8 C) (12/14 1102) Temp Source: Oral (12/14 1102) BP: 94/40 (12/14 1102) Pulse Rate: 96 (12/14 1102)  Labs: Recent Labs    06/20/21 0811 06/20/21 1049  HGB 4.0* 3.6*  HCT 14.9* 13.4*  PLT 282 237  APTT 42*  --   LABPROT 19.7*  --   INR 1.7*  --   CREATININE 0.71  --     Estimated Creatinine Clearance: 73.8 mL/min (by C-G formula based on SCr of 0.71 mg/dL).   Medical History: Past Medical History:  Diagnosis Date   Diabetes mellitus without complication ([place])    DVT (deep venous thrombosis) ([place]) 09/06/2020   High cholesterol    Hx of blood clots    Hypertension    IDA (iron deficiency anemia) 09/21/2020    Assessment: 65 y.o. female with medical history significant of DVT/PE on Xarelto, hypertension, hyperlipidemia, diabetes mellitus, anemia, obesity, endometrial cancer, dCHF, who presents with weakness, cough, fever. Pt last dose of Xarelto noted to be 12/13 around 1800. Hgb 4>3.6.  Plan:  Last dose of Xarelto 20 mg was >8 hours ago. Will order low dose andexxa  Tommey Barret O Tanisa Lagace 06/20/2021,11:46 AM

## 2021-06-20 NOTE — ED Notes (Signed)
RN attempted to give report to ICU at this time. Per ICU charge nurse, "I will call you back." ED charge nurse notified

## 2021-06-20 NOTE — Progress Notes (Signed)
PHARMACY -  BRIEF ANTIBIOTIC NOTE   Pharmacy has received consult(s) for vancomycin and cefepime from an ED provider.  The patient's profile has been reviewed for ht/wt/allergies/indication/available labs.    One time order(s) placed for: Cefepime 2 g IV Vancomycin 2250 mg IV  Further antibiotics/pharmacy consults should be ordered by admitting physician if indicated.                       Thank you, Forde Dandy Daesha Insco 06/20/2021  9:37 AM

## 2021-06-20 NOTE — ED Triage Notes (Signed)
Pt to ED [facility] from home for cough, weakness for a few days. Pt was found on the floor when they got there, reports got light headed and fell. +blood thinners. Does not think hit head HR 110 ST 800mg  ibuprofen given PTA Skin color pale on arrival  20g right wrist PTA Diabetic, HTN Denies pain

## 2021-06-20 NOTE — H&P (View-Only) (Signed)
° ° °GI Inpatient Consult Note ° °Reason for Consult: Symptomatic anemia °  °Attending Requesting Consult: Dr. Xilin Niu, MD ° °History of Present Illness: °Courtney Mack is a 65 y.o. female seen for evaluation of symptomatic anemia at the request of admitting hospitalist - Dr. Xilin Niu. Pt has a PMH of HTN, HLD, morbid obesity, dCHF, diabetes mellitus, Hx of endometrial cancer, and Hx of DVT/PE on Xarelto. She presented to the [facility] ED this morning for chief complaint of generalized weakness, cough, fever, and mild shortness of breath. She reported acute weakness over the past 3 days and feeling extremely weak and fatigued. She had fall in the home this morning and EMS was called. Upon presentation to the ED, she had low-grade fever 100.2, tachycardia with 104 bpm, and tachypneic with 33 respirations/minute. Labs were significant for WBC 14.2K, INR 1.7, PTT 42, normal renal function, and normal LFTs. She was found to have profound anemia with hemoglobin 4.0--> 3.6 which was 10.7 in July of this year. Per ED physician, DRE showed black stool in rectal vault which was guaiac positive. She had CXR showing mild cardiomegaly without any evidence of consolidation or infiltrate. CT head negative for any acute findings. GI was consulted in context of profound anemia. Intensivist and Vascular Surgery were also consulted. She was ordered to have 4 units pRBCs. Kcentra was used to reverse effects of anticoagulant. She met sepsis protocol and was started on IV fluid resuscitation, IV Protonix, and broad spectrum antibiotics. Blood cultures pending.  ° °Patient seen and examined this afternoon resting in ED stretcher. She just had her first unit or pRBCs started. She feels like she is having to use a lot of energy to get a full breath. She is having some left lower extremity pain. She reports over the past few weeks she thinks her stools have been darker. She is not able to say if she has noticed any frank bleeding or  clots in the toilet bowl. She denies any changes in her bowel habits. She denies any diarrhea or constipation. She does endorse some burning epigastric abdominal pain which started over the last day. Pain is 4/10 in severity and does not radiate. No clear alleviating factors. She denies any history of GI bleeding. She has never had an endoscopy or colonoscopy. She does follow as outpatient with Dr. Dew for hx of DVT s/p IVC filter 08/2020, pulmonary thrombectomy 08/2020, and peripheral vascular thrombectomy 01/03/21. She does chronic anticoagulation with Xarelto. Her lost dose was Monday evening. She reports she didn't take it last night because she felt bad and skipped dinner. She denies any lower abdominal pain. She denies any dysphagia, odynophagia, early satiety, nausea, vomiting, or hoarseness. She takes Tylenol for pain at home and denies any other NSAID use.  ° ° °Past Medical History:  °Past Medical History:  °Diagnosis Date  ° Diabetes mellitus without complication (HCC)   ° DVT (deep venous thrombosis) (HCC) 09/06/2020  ° High cholesterol   ° Hx of blood clots   ° Hypertension   ° IDA (iron deficiency anemia) 09/21/2020  °  °Problem List: °Patient Active Problem List  ° Diagnosis Date Noted  ° Symptomatic anemia 06/20/2021  ° Sepsis (HCC) 06/20/2021  ° HLD (hyperlipidemia) 06/20/2021  ° Fall at home, initial encounter 06/20/2021  ° Cough 06/20/2021  ° Chronic diastolic CHF (congestive heart failure) (HCC) 06/20/2021  ° GI bleeding 06/20/2021  ° Cellulitis of left lower leg 06/20/2021  ° Acute bilateral deep vein thrombosis (DVT)   of femoral veins (HCC) 01/02/2021  ° DOE (dyspnea on exertion) 11/10/2020  ° Risk factors for obstructive sleep apnea 11/10/2020  ° Uterine cancer (HCC) 11/08/2020  ° IDA (iron deficiency anemia) 09/21/2020  ° Endometrial cancer (HCC) 09/13/2020  ° Acute blood loss anemia 09/06/2020  ° Vaginal bleeding 09/06/2020  ° DVT (deep venous thrombosis) (HCC) 09/06/2020  ° Acute massive pulmonary  embolism (HCC) 08/25/2020  ° Anemia, unspecified 08/24/2020  ° Morbidly obese (HCC) 08/23/2020  ° Essential hypertension 08/23/2020  ° Diabetes mellitus without complication (HCC) 08/23/2020  ° Dyslipidemia 08/23/2020  ° Acute respiratory failure with hypoxia (HCC)   ° Pulmonary embolism (HCC) 08/18/2020  ° Vaginal bleeding, abnormal 08/18/2020  ° Obesity, morbid, BMI 50 or higher (HCC) 08/18/2020  °  °Past Surgical History: °Past Surgical History:  °Procedure Laterality Date  ° IVC FILTER INSERTION N/A 08/18/2020  ° Procedure: IVC FILTER INSERTION;  Surgeon: Schnier, Gregory G, MD;  Location: [facility] INVASIVE [facility];  Service: Cardiovascular;  Laterality: N/A;  ° PERIPHERAL VASCULAR THROMBECTOMY Bilateral 01/03/2021  ° Procedure: PERIPHERAL VASCULAR THROMBECTOMY;  Surgeon: Dew, Jason S, MD;  Location: [facility];  Service: Cardiovascular;  Laterality: Bilateral;  ° PULMONARY THROMBECTOMY N/A 08/18/2020  ° Procedure: PULMONARY THROMBECTOMY;  Surgeon: Schnier, Gregory G, MD;  Location: [facility];  Service: Cardiovascular;  Laterality: N/A;  ° TUBAL LIGATION    °  °Allergies: °No Known Allergies  °Home Medications: °(Not in a hospital admission) ° °Home medication reconciliation was completed with the patient.  ° °Scheduled Inpatient Medications: °  ° sodium chloride   Intravenous Once  ° insulin aspart  0-5 Units Subcutaneous QHS  ° insulin aspart  0-9 Units Subcutaneous TID WC  ° mometasone-formoterol  2 puff Inhalation BID  ° multivitamin with minerals  1 tablet Oral Daily  ° rosuvastatin  10 mg Oral Daily  ° ° °Continuous Inpatient Infusions: °  ° sodium chloride Stopped (06/20/21 1229)  ° cefTRIAXone (ROCEPHIN)  IV    ° lactated ringers 75 mL/hr at 06/20/21 1013  ° pantoprazole    ° [START ON 06/21/2021] vancomycin    ° ° °PRN Inpatient Medications:  °acetaminophen, albuterol, dextromethorphan-guaiFENesin, hydrALAZINE, ondansetron (ZOFRAN) IV ° °Family History: °family history includes Breast  cancer in her cousin; Congestive Heart Failure in her father; Diabetes in her father and mother; High Cholesterol in her father; Hypertension in her father and mother.  The patient's family history is negative for inflammatory bowel disorders, GI malignancy, or solid organ transplantation. ° °Social History:  ° reports that she quit smoking about 40 years ago. Her smoking use included cigarettes. She has a 0.13 pack-year smoking history. She has never used smokeless tobacco. She reports that she does not currently use alcohol. She reports that she does not currently use drugs. The patient denies ETOH, tobacco, or drug use.  ° °Review of Systems: °Constitutional: Weight is stable.  °Eyes: No changes in vision. °ENT: No oral lesions, sore throat.  °GI: see HPI.  °Heme/Lymph: No easy bruising.  °CV: No chest pain.  °GU: No hematuria.  °Integumentary: No rashes.  °Neuro: No headaches.  °Psych: No depression/anxiety.  °Endocrine: No heat/cold intolerance.  °Allergic/Immunologic: No urticaria.  °Resp: No cough, SOB.  °Musculoskeletal: No joint swelling.  °  °Physical Examination: °BP (!) 100/31    Pulse 87    Temp 98.7 °F (37.1 °C) (Oral)    Resp (!) 22    Wt 104.1 kg    SpO2 99%      BMI 46.35 kg/m²  °Obese female laying in ED stretcher. No family at bedside.  °Gen: NAD, alert and oriented x 4 °HEENT: PEERLA, EOMI, °Neck: supple, no JVD or thyromegaly °Chest: CTA bilaterally, no wheezes, crackles, or other adventitious sounds °CV: RRR, no m/g/c/r °Abd: soft, ND, +BS in all four quadrants; nontender to deep palpation in all four quadrants, no HSM, guarding, ridigity, or rebound tenderness °Ext: 3+ lymphedema, unable to palpate pulses due to body habitus °Skin: no rash or lesions noted °Lymph: no LAD ° °Data: °Lab Results  °Component Value Date  ° WBC 13.8 (H) 06/20/2021  ° HGB 3.6 (LL) 06/20/2021  ° HCT 13.4 (LL) 06/20/2021  ° MCV 68.0 (L) 06/20/2021  ° PLT 237 06/20/2021  ° °Recent Labs  °Lab 06/20/21 °0811 06/20/21 °1049   °HGB 4.0* 3.6*  ° °Lab Results  °Component Value Date  ° NA 136 06/20/2021  ° K 3.6 06/20/2021  ° CL 107 06/20/2021  ° CO2 23 06/20/2021  ° BUN 14 06/20/2021  ° CREATININE 0.71 06/20/2021  ° °Lab Results  °Component Value Date  ° ALT 8 06/20/2021  ° AST 14 (L) 06/20/2021  ° ALKPHOS 49 06/20/2021  ° BILITOT 1.8 (H) 06/20/2021  ° °Recent Labs  °Lab 06/20/21 °0811  °APTT 42*  °INR 1.7*  ° °Assessment/Plan: ° °65 y/o Caucasian female with a PMH of HTN, HLD, morbid obesity, diabetes mellitus, Hx of endometrial cancer, dCHF, and Hx of DVT/PE on Xarelto presented to the [facility] ED this am for chief complaint of generalized weakness, cough, fever, and left lower leg pain. GI consulted in context of profound anemia.  ° °Profound anemia/IDA - concerning for occult GI bleed. No overt gastrointestinal bleeding. Drop in hemoglobin from baseline 10s to 3.6 this morning. DDx includes peptic ulcer disease, gastritis, AVMs, neoplasm, colon polyp, Dieulafoy's lesion, erosive esophagitis, etc.  ° °Epigastric abdominal pain - raises concern for peptic etiologies (PUD/gastritis) ° °Sepsis - likely 2/2 left lower extremity cellulitis  ° °Hx of DVT/PE - last dose of Xarelto Monday evening ° ° °Recommendations: ° °- Transfuse 4 units pRBCs per mass transfusion protocol °- Maintain 2 large bore IVs for access °- Continue to monitor H&H q6h. Transfuse for hemoglobin <7.0.  °- No overt gastrointestinal bleeding °- Recommend CTA to assess for any active bleeding. If positive, will plan for embolization with Vascular Surgery. If negative, can provide further diagnostic and potentially therapeutic interventions with endoscopy and colonoscopy tomorrow.  °- Discussed plan of care with Vascular Surgery who are in agreement °- Continue treatment of sepsis and cellulitis. Appreciate hospitalist recs.  °- Discussed EGD and colonoscopy procedure details and indications with patient and her daughter, Joanie, over the telephone. Patient is in agreement.   °- Hold Xarelto and all NSAIDs.  °- Continue IV Protonix for gastric protection °- Following along with you °- Plan for EGD and colonoscopy tomorrow if CTA is negative ° °I reviewed the risks (including bleeding, perforation, infection, anesthesia complications, cardiac/respiratory complications), benefits and alternatives of EGD and colonoscopy. Patient consents to proceed.  ° ° °Thank you for the consult. Please call with questions or concerns. ° °Priyansh Pry M Ladarrian Asencio, PA-C °[facility] °[phone] °[phone] (Cell) ° °

## 2021-06-20 NOTE — Consult Note (Signed)
GI Inpatient Consult Note  Reason for Consult: Symptomatic anemia   Attending Requesting Consult: Dr. Ivor Costa, MD  History of Present Illness: Courtney Mack is a 65 y.o. female seen for evaluation of symptomatic anemia at the request of admitting hospitalist - Dr. Ivor Costa. Pt has a PMH of HTN, HLD, morbid obesity, dCHF, diabetes mellitus, Hx of endometrial cancer, and Hx of DVT/PE on Xarelto. She presented to the [facility] ED this morning for chief complaint of generalized weakness, cough, fever, and mild shortness of breath. She reported acute weakness over the past 3 days and feeling extremely weak and fatigued. She had fall in the home this morning and EMS was called. Upon presentation to the ED, she had low-grade fever 100.2, tachycardia with 104 bpm, and tachypneic with 33 respirations/minute. Labs were significant for WBC 14.2K, INR 1.7, PTT 42, normal renal function, and normal LFTs. She was found to have profound anemia with hemoglobin 4.0--> 3.6 which was 10.7 in July of this year. Per ED physician, DRE showed black stool in rectal vault which was guaiac positive. She had CXR showing mild cardiomegaly without any evidence of consolidation or infiltrate. CT head negative for any acute findings. GI was consulted in context of profound anemia. Intensivist and Vascular Surgery were also consulted. She was ordered to have 4 units pRBCs. Eppie Gibson was used to reverse effects of anticoagulant. She met sepsis protocol and was started on IV fluid resuscitation, IV Protonix, and broad spectrum antibiotics. Blood cultures pending.   Patient seen and examined this afternoon resting in ED stretcher. She just had her first unit or pRBCs started. She feels like she is having to use a lot of energy to get a full breath. She is having some left lower extremity pain. She reports over the past few weeks she thinks her stools have been darker. She is not able to say if she has noticed any frank bleeding or  clots in the toilet bowl. She denies any changes in her bowel habits. She denies any diarrhea or constipation. She does endorse some burning epigastric abdominal pain which started over the last day. Pain is 4/10 in severity and does not radiate. No clear alleviating factors. She denies any history of GI bleeding. She has never had an endoscopy or colonoscopy. She does follow as outpatient with Dr. Lucky Cowboy for hx of DVT s/p IVC filter 08/2020, pulmonary thrombectomy 08/2020, and peripheral vascular thrombectomy 01/03/21. She does chronic anticoagulation with Xarelto. Her lost dose was Monday evening. She reports she didn't take it last night because she felt bad and skipped dinner. She denies any lower abdominal pain. She denies any dysphagia, odynophagia, early satiety, nausea, vomiting, or hoarseness. She takes Tylenol for pain at home and denies any other NSAID use.    Past Medical History:  Past Medical History:  Diagnosis Date   Diabetes mellitus without complication ([place])    DVT (deep venous thrombosis) ([place]) 09/06/2020   High cholesterol    Hx of blood clots    Hypertension    IDA (iron deficiency anemia) 09/21/2020    Problem List: Patient Active Problem List   Diagnosis Date Noted   Symptomatic anemia 06/20/2021   Sepsis ([place]) 06/20/2021   HLD (hyperlipidemia) 06/20/2021   Fall at home, initial encounter 06/20/2021   Cough 06/20/2021   Chronic diastolic CHF (congestive heart failure) ([place]) 06/20/2021   GI bleeding 06/20/2021   Cellulitis of left lower leg 06/20/2021   Acute bilateral deep vein thrombosis (DVT)  of femoral veins ([place]) 01/02/2021   DOE (dyspnea on exertion) 11/10/2020   Risk factors for obstructive sleep apnea 11/10/2020   Uterine cancer (HCC) 11/08/2020   IDA (iron deficiency anemia) 09/21/2020   Endometrial cancer ([place]) 09/13/2020   Acute blood loss anemia 09/06/2020   Vaginal bleeding 09/06/2020   DVT (deep venous thrombosis) ([place]) 09/06/2020   Acute massive pulmonary  embolism ([place]) 08/25/2020   Anemia, unspecified 08/24/2020   Morbidly obese ([place]) 08/23/2020   Essential hypertension 08/23/2020   Diabetes mellitus without complication ([place]) 51/76/1607   Dyslipidemia 08/23/2020   Acute respiratory failure with hypoxia ([place])    Pulmonary embolism ([place]) 08/18/2020   Vaginal bleeding, abnormal 08/18/2020   Obesity, morbid, BMI 50 or higher ([place]) 08/18/2020    Past Surgical History: Past Surgical History:  Procedure Laterality Date   IVC FILTER INSERTION N/A 08/18/2020   Procedure: IVC FILTER INSERTION;  Surgeon: Katha Cabal, MD;  Location: [place] CV [facility];  Service: Cardiovascular;  Laterality: N/A;   PERIPHERAL VASCULAR THROMBECTOMY Bilateral 01/03/2021   Procedure: PERIPHERAL VASCULAR THROMBECTOMY;  Surgeon: Algernon Huxley, MD;  Location: [place] CV LAB;  Service: Cardiovascular;  Laterality: Bilateral;   PULMONARY THROMBECTOMY N/A 08/18/2020   Procedure: PULMONARY THROMBECTOMY;  Surgeon: Katha Cabal, MD;  Location: [place] CV LAB;  Service: Cardiovascular;  Laterality: N/A;   TUBAL LIGATION      Allergies: No Known Allergies  Home Medications: (Not in a hospital admission)  Home medication reconciliation was completed with the patient.   Scheduled Inpatient Medications:    sodium chloride   Intravenous Once   insulin aspart  0-5 Units Subcutaneous QHS   insulin aspart  0-9 Units Subcutaneous TID WC   mometasone-formoterol  2 puff Inhalation BID   multivitamin with minerals  1 tablet Oral Daily   rosuvastatin  10 mg Oral Daily    Continuous Inpatient Infusions:    sodium chloride Stopped (06/20/21 1229)   cefTRIAXone (ROCEPHIN)  IV     lactated ringers 75 mL/hr at 06/20/21 1013   pantoprazole     [START ON 06/21/2021] vancomycin      PRN Inpatient Medications:  acetaminophen, albuterol, dextromethorphan-guaiFENesin, hydrALAZINE, ondansetron (ZOFRAN) IV  Family History: family history includes Breast  cancer in her cousin; Congestive Heart Failure in her father; Diabetes in her father and mother; High Cholesterol in her father; Hypertension in her father and mother.  The patient's family history is negative for inflammatory bowel disorders, GI malignancy, or solid organ transplantation.  Social History:   reports that she quit smoking about 40 years ago. Her smoking use included cigarettes. She has a 0.13 pack-year smoking history. She has never used smokeless tobacco. She reports that she does not currently use alcohol. She reports that she does not currently use drugs. The patient denies ETOH, tobacco, or drug use.   Review of Systems: Constitutional: Weight is stable.  Eyes: No changes in vision. ENT: No oral lesions, sore throat.  GI: see HPI.  Heme/Lymph: No easy bruising.  CV: No chest pain.  GU: No hematuria.  Integumentary: No rashes.  Neuro: No headaches.  Psych: No depression/anxiety.  Endocrine: No heat/cold intolerance.  Allergic/Immunologic: No urticaria.  Resp: No cough, SOB.  Musculoskeletal: No joint swelling.    Physical Examination: BP (!) 100/31    Pulse 87    Temp 98.7 F (37.1 C) (Oral)    Resp (!) 22    Wt 104.1 kg    SpO2 99%  BMI 46.35 kg/m  Obese female laying in ED stretcher. No family at bedside.  Gen: NAD, alert and oriented x 4 HEENT: PEERLA, EOMI, Neck: supple, no JVD or thyromegaly Chest: CTA bilaterally, no wheezes, crackles, or other adventitious sounds CV: RRR, no m/g/c/r Abd: soft, ND, +BS in all four quadrants; nontender to deep palpation in all four quadrants, no HSM, guarding, ridigity, or rebound tenderness Ext: 3+ lymphedema, unable to palpate pulses due to body habitus Skin: no rash or lesions noted Lymph: no LAD  Data: Lab Results  Component Value Date   WBC 13.8 (H) 06/20/2021   HGB 3.6 (LL) 06/20/2021   HCT 13.4 (LL) 06/20/2021   MCV 68.0 (L) 06/20/2021   PLT 237 06/20/2021   Recent Labs  Lab 06/20/21 0811 06/20/21 1049   HGB 4.0* 3.6*   Lab Results  Component Value Date   NA 136 06/20/2021   K 3.6 06/20/2021   CL 107 06/20/2021   CO2 23 06/20/2021   BUN 14 06/20/2021   CREATININE 0.71 06/20/2021   Lab Results  Component Value Date   ALT 8 06/20/2021   AST 14 (L) 06/20/2021   ALKPHOS 49 06/20/2021   BILITOT 1.8 (H) 06/20/2021   Recent Labs  Lab 06/20/21 0811  APTT 42*  INR 1.7*   Assessment/Plan:  66 y/o Caucasian female with a PMH of HTN, HLD, morbid obesity, diabetes mellitus, Hx of endometrial cancer, dCHF, and Hx of DVT/PE on Xarelto presented to the [facility] ED this am for chief complaint of generalized weakness, cough, fever, and left lower leg pain. GI consulted in context of profound anemia.   Profound anemia/IDA - concerning for occult GI bleed. No overt gastrointestinal bleeding. Drop in hemoglobin from baseline 10s to 3.6 this morning. DDx includes peptic ulcer disease, gastritis, AVMs, neoplasm, colon polyp, Dieulafoy's lesion, erosive esophagitis, etc.   Epigastric abdominal pain - raises concern for peptic etiologies (PUD/gastritis)  Sepsis - likely 2/2 left lower extremity cellulitis   Hx of DVT/PE - last dose of Xarelto Monday evening   Recommendations:  - Transfuse 4 units pRBCs per mass transfusion protocol - Maintain 2 large bore IVs for access - Continue to monitor H&H q6h. Transfuse for hemoglobin <7.0.  - No overt gastrointestinal bleeding - Recommend CTA to assess for any active bleeding. If positive, will plan for embolization with Vascular Surgery. If negative, can provide further diagnostic and potentially therapeutic interventions with endoscopy and colonoscopy tomorrow.  - Discussed plan of care with Vascular Surgery who are in agreement - Continue treatment of sepsis and cellulitis. Appreciate hospitalist recs.  - Discussed EGD and colonoscopy procedure details and indications with patient and her daughter, Otis Peak, over the telephone. Patient is in agreement.   - Hold Xarelto and all NSAIDs.  - Continue IV Protonix for gastric protection - Following along with you - Plan for EGD and colonoscopy tomorrow if CTA is negative  I reviewed the risks (including bleeding, perforation, infection, anesthesia complications, cardiac/respiratory complications), benefits and alternatives of EGD and colonoscopy. Patient consents to proceed.    Thank you for the consult. Please call with questions or concerns.  Reeves Forth [facility] [phone] (Cell)

## 2021-06-21 ENCOUNTER — Other Ambulatory Visit: Payer: Self-pay

## 2021-06-21 ENCOUNTER — Other Ambulatory Visit: Payer: Medicare (Managed Care)

## 2021-06-21 ENCOUNTER — Encounter
Admission: EM | Disposition: A | Discharge status: Home Health | Payer: Self-pay | Source: Home / Self Care | Attending: Internal Medicine

## 2021-06-21 ENCOUNTER — Encounter: Payer: Self-pay | Admitting: Internal Medicine

## 2021-06-21 ENCOUNTER — Inpatient Hospital Stay: Payer: Medicare (Managed Care) | Admitting: Anesthesiology

## 2021-06-21 DIAGNOSIS — L8990 Pressure ulcer of unspecified site, unspecified stage: Secondary | ICD-10-CM

## 2021-06-21 HISTORY — PX: ESOPHAGOGASTRODUODENOSCOPY: SHX5428

## 2021-06-21 HISTORY — DX: Pressure ulcer of unspecified site, unspecified stage: L89.90

## 2021-06-21 HISTORY — PX: COLONOSCOPY: SHX5424

## 2021-06-21 LAB — CBC
HCT: 24.8 % — ABNORMAL LOW (ref 36.0–46.0)
HCT: 25.3 % — ABNORMAL LOW (ref 36.0–46.0)
HCT: 27.7 % — ABNORMAL LOW (ref 36.0–46.0)
HCT: 24.1 % — ABNORMAL LOW (ref 36.0–46.0)
Hemoglobin: 7.9 g/dL — ABNORMAL LOW (ref 12.0–15.0)
Hemoglobin: 8.1 g/dL — ABNORMAL LOW (ref 12.0–15.0)
Hemoglobin: 8.6 g/dL — ABNORMAL LOW (ref 12.0–15.0)
Hemoglobin: 7.6 g/dL — ABNORMAL LOW (ref 12.0–15.0)
MCH: 23.4 pg — ABNORMAL LOW (ref 26.0–34.0)
MCH: 23.6 pg — ABNORMAL LOW (ref 26.0–34.0)
MCH: 23.2 pg — ABNORMAL LOW (ref 26.0–34.0)
MCH: 23.7 pg — ABNORMAL LOW (ref 26.0–34.0)
MCHC: 31.9 g/dL (ref 30.0–36.0)
MCHC: 32 g/dL (ref 30.0–36.0)
MCHC: 31 g/dL (ref 30.0–36.0)
MCHC: 31.5 g/dL (ref 30.0–36.0)
MCV: 73.4 fL — ABNORMAL LOW (ref 80.0–100.0)
MCV: 73.8 fL — ABNORMAL LOW (ref 80.0–100.0)
MCV: 74.9 fL — ABNORMAL LOW (ref 80.0–100.0)
MCV: 75.1 fL — ABNORMAL LOW (ref 80.0–100.0)
Platelets: 191 10*3/uL (ref 150–400)
Platelets: 169 10*3/uL (ref 150–400)
Platelets: 168 10*3/uL (ref 150–400)
Platelets: 165 10*3/uL (ref 150–400)
RBC: 3.38 MIL/uL — ABNORMAL LOW (ref 3.87–5.11)
RBC: 3.43 MIL/uL — ABNORMAL LOW (ref 3.87–5.11)
RBC: 3.7 MIL/uL — ABNORMAL LOW (ref 3.87–5.11)
RBC: 3.21 MIL/uL — ABNORMAL LOW (ref 3.87–5.11)
RDW: 19.5 % — ABNORMAL HIGH (ref 11.5–15.5)
RDW: 19.8 % — ABNORMAL HIGH (ref 11.5–15.5)
RDW: 20.1 % — ABNORMAL HIGH (ref 11.5–15.5)
RDW: 20.4 % — ABNORMAL HIGH (ref 11.5–15.5)
WBC: 12.5 10*3/uL — ABNORMAL HIGH (ref 4.0–10.5)
WBC: 12.2 10*3/uL — ABNORMAL HIGH (ref 4.0–10.5)
WBC: 12.6 10*3/uL — ABNORMAL HIGH (ref 4.0–10.5)
WBC: 10.3 10*3/uL (ref 4.0–10.5)
nRBC: 0.3 % — ABNORMAL HIGH (ref 0.0–0.2)
nRBC: 0.2 % (ref 0.0–0.2)
nRBC: 0.2 % (ref 0.0–0.2)
nRBC: 0 % (ref 0.0–0.2)

## 2021-06-21 LAB — CREATININE, SERUM
Creatinine, Ser: 0.85 mg/dL (ref 0.44–1.00)
GFR, Estimated: >60 mL/min (ref >60)

## 2021-06-21 LAB — GLUCOSE, CAPILLARY
Glucose-Capillary: 129 mg/dL — ABNORMAL HIGH (ref 70–99)
Glucose-Capillary: 94 mg/dL (ref 70–99)
Glucose-Capillary: 93 mg/dL (ref 70–99)
Glucose-Capillary: 86 mg/dL (ref 70–99)
Glucose-Capillary: 99 mg/dL (ref 70–99)
Glucose-Capillary: 98 mg/dL (ref 70–99)

## 2021-06-21 LAB — BPAM RBC
Blood Product Expiration Date: 202301022359
Blood Product Expiration Date: 202301032359
Blood Product Expiration Date: 202301032359
Blood Product Expiration Date: 202301032359
ISSUE DATE / TIME: 202212141058
ISSUE DATE / TIME: 202212141400
ISSUE DATE / TIME: 202212141819
ISSUE DATE / TIME: 202212142154
Unit Type and Rh: 6200
Unit Type and Rh: 6200
Unit Type and Rh: 6200
Unit Type and Rh: 6200

## 2021-06-21 LAB — TYPE AND SCREEN
ABO/RH(D): A POS
Antibody Screen: NEGATIVE
Unit division: 0
Unit division: 0
Unit division: 0
Unit division: 0

## 2021-06-21 LAB — KOH PREP

## 2021-06-21 LAB — SEDIMENTATION RATE: Sed Rate: 64 mm/hr — ABNORMAL HIGH (ref 0–30)

## 2021-06-21 SURGERY — EGD (ESOPHAGOGASTRODUODENOSCOPY)
Anesthesia: General

## 2021-06-21 MED ORDER — EPHEDRINE 5 MG/ML INJ
INTRAVENOUS | Status: AC
  Filled 2021-06-21: qty 5

## 2021-06-21 MED ORDER — LACTATED RINGERS IV SOLN: INTRAVENOUS | Status: DC | PRN

## 2021-06-21 MED ORDER — LIDOCAINE HCL (CARDIAC) PF 100 MG/5ML IV SOSY
PREFILLED_SYRINGE | INTRAVENOUS | Status: DC | PRN
  Administered 2021-06-21: 100 mg via INTRAVENOUS

## 2021-06-21 MED ORDER — EPHEDRINE SULFATE 50 MG/ML IJ SOLN
INTRAMUSCULAR | Status: DC | PRN
  Administered 2021-06-21: 10 mg via INTRAVENOUS

## 2021-06-21 MED ORDER — PROPOFOL 500 MG/50ML IV EMUL
INTRAVENOUS | Status: DC | PRN
  Administered 2021-06-21: 120 ug/kg/min via INTRAVENOUS

## 2021-06-21 MED ORDER — SODIUM CHLORIDE 0.9 % IV SOLN: INTRAVENOUS | Status: DC

## 2021-06-21 MED ORDER — PROPOFOL 500 MG/50ML IV EMUL
INTRAVENOUS | Status: AC
  Filled 2021-06-21: qty 50

## 2021-06-21 NOTE — Progress Notes (Signed)
PROGRESS NOTE    Courtney Mack  TDV:[id number] DOB: [birth year] DOA: 06/20/2021 PCP: [facility]    Brief Narrative:  65 y.o. female with medical history significant of DVT/PE on Xarelto, hypertension, hyperlipidemia, diabetes mellitus, anemia, obesity, endometrial cancer, dCHF, morbid obesity, who presents with weakness, cough, fever and left lower  leg pain.   Patient states that she has intermittent dark stool which has been going on for several weeks.  She developed generalized weakness. She states that she felt lightheadedness and fell on the floor this morning.  No loss of consciousness. No head or neck injury. She also reports left lower leg pain. Her left lower leg is mild erythematous.  Patient has fever and chills.  Patient reports cough and mild shortness breath.  Denies chest pain.  No nausea, vomiting, diarrhea or abdominal pain.  Denies symptoms of UTI.  Patient moves all extremities.  Schedule for EGD and colonoscopy on 12/15 with GI   Assessment & Plan:   Principal Problem:   GI bleeding Active Problems:   Pulmonary embolism (HCC)   Diabetes mellitus without complication (HCC)   Acute blood loss anemia   DVT (deep venous thrombosis) (HCC)   Symptomatic anemia   Sepsis (HCC)   HLD (hyperlipidemia)   Fall at home, initial encounter   Cough   Chronic diastolic CHF (congestive heart failure) (HCC)   Cellulitis of left lower leg   Pressure injury of skin  GI bleeding, acute blood loss anemia and symptomatic anemia Hgb dropped from 10.7 --> 4.0 --> 3.6.  consulted Dr. Alice Reichert of GI Also consulted Dr. Lucky Cowboy of [facility].  Initially  NM blood loss scan is ordered, but since pt dose not have active bleeding, Dr. Lucky Cowboy d/c'ed NM blood loss scan test Status post 1 dose Kcentra to reverse Xarelto effect Plan: Continue stepdown status for now Status posttransfusion 4 units PRBC PPI drip Antiemetics as needed No NSAIDs or Xarelto Maintain 2  large-bore IV access points of possible Monitor and transfuse PRBC hemoglobin less than 7\   Hx of Pulmonary embolism and DVT pt is on Xarelto Pt is s/p of IVC filter on 08/18/20 Consulted Dr. Lucky Cowboy of [facility]. -f/u Holding Xarelto in setting of bleed -Appreciate vascular surgery recommendations  Diabetes mellitus without complication ([place]) Recent A1c 6.2, well controlled.   Patient taking metformin at home -Sliding scale insulin   Sepsis due to Cellulitis of left lower leg  Patient feels great.   For sepsis with WBC 14.2, tachycardia with heart rate 105, RR 33, patient also has a fever 100.2. MRSA screen positive Plan: DC ceftriaxone Continue vancomycin pharmacy dosing Follow blood cultures Monitor vitals and fever curve   HLD (hyperlipidemia) -Crestor   Fall at home, initial encounter CT head negative. -pt/ot when able to (not ordered)   Cough Chest x-ray negative. -Supportive care, as needed Mucinex and albuterol   HTN -hold Cozaar since patient is at high risk of developing hypotension due to severe anemia -IV hydralazine as needed   Chronic diastolic CHF (congestive heart failure) ([place])  2D echo on 08/19/2020 showed EF 60 to 65% with grade 1 diastolic dysfunction.  Chest x-ray is negative for pulmonary edema, does not seem to have CHF exacerbation. Scherrie Merritts give as needed Lasix while patient is getting blood transfusion -check BNP   DVT prophylaxis: None.  Chemoprophylaxis contraindicated.  SCD contraindicated given history of DVT Code Status: Full Family Communication: None today Disposition Plan: Status is: Inpatient  Remains inpatient appropriate because:  Severe acute on chronic blood loss anemia       Level of care: Stepdown  Consultants:  GI Vascular surgery  Procedures:  EGD 12/15 Colonoscopy 12/15  Antimicrobials: None   Subjective: Patient seen and examined.  Resting comfortably in bed.  No visible distress.  Appears  pale  Objective: Vitals:   06/21/21 1257 06/21/21 1402 06/21/21 1422 06/21/21 1452  BP: (!) 104/48 (!) 95/47 (!) 95/54 (!) 104/59  Pulse: 80   80  Resp: (!) 22   17  Temp: (!) 96.5 F (35.8 C) (!) 97.3 F (36.3 C)  97.9 F (36.6 C)  TempSrc: Temporal Temporal  Oral  SpO2: 100%   93%  Weight: 110.2 kg     Height: 4\' 11"  (1.499 m)       Intake/Output Summary (Last 24 hours) at 06/21/2021 1513 Last data filed at 06/21/2021 1345 Gross per 24 hour  Intake 2439.25 ml  Output 250 ml  Net 2189.25 ml   Filed Weights   06/20/21 2100 06/21/21 0500 06/21/21 1257  Weight: 110.2 kg 110.2 kg 110.2 kg    Examination:  General exam: No acute distress.  Pale Respiratory system: Lungs clear.  Normal work of breathing.  Room air Cardiovascular system: S1-S2, RRR, no murmurs Gastrointestinal system: Soft, NT/ND, normal bowel sounds Central nervous system: Alert and oriented. No focal neurological deficits. Extremities: Mild bilateral lower leg edema.  LLE with mild erythema.  Tenderness to touch Skin: No rashes, lesions or ulcers Psychiatry: Judgement and insight appear normal. Mood & affect appropriate.     Data Reviewed: I have personally reviewed following labs and imaging studies  CBC: Recent Labs  Lab 06/20/21 0811 06/20/21 1049 06/21/21 0230 06/21/21 1009  WBC 14.2* 13.8* 12.5* 12.2*  NEUTROABS 12.9*  --   --   --   HGB 4.0* 3.6* 7.9* 8.1*  HCT 14.9* 13.4* 24.8* 25.3*  MCV 66.8* 68.0* 73.4* 73.8*  PLT 282 237 191 161   Basic Metabolic Panel: Recent Labs  Lab 06/20/21 0811 06/21/21 0230  NA 136  --   K 3.6  --   CL 107  --   CO2 23  --   GLUCOSE 122*  --   BUN 14  --   CREATININE 0.71 0.85  CALCIUM 8.6*  --    GFR: Estimated Creatinine Clearance: 71.9 mL/min (by C-G formula based on SCr of 0.85 mg/dL). Liver Function Tests: Recent Labs  Lab 06/20/21 0811  AST 14*  ALT 8  ALKPHOS 49  BILITOT 1.8*  PROT 6.6  ALBUMIN 2.8*   No results for input(s):  LIPASE, AMYLASE in the last 168 hours. No results for input(s): AMMONIA in the last 168 hours. Coagulation Profile: Recent Labs  Lab 06/20/21 0811  INR 1.7*   Cardiac Enzymes: No results for input(s): CKTOTAL, CKMB, CKMBINDEX, TROPONINI in the last 168 hours. BNP (last 3 results) No results for input(s): PROBNP in the last 8760 hours. HbA1C: No results for input(s): HGBA1C in the last 72 hours. CBG: Recent Labs  Lab 06/20/21 1756 06/20/21 2107 06/21/21 0741 06/21/21 1128 06/21/21 1302  GLUCAP 146* 129* 94 93 86   Lipid Profile: No results for input(s): CHOL, HDL, LDLCALC, TRIG, CHOLHDL, LDLDIRECT in the last 72 hours. Thyroid Function Tests: No results for input(s): TSH, T4TOTAL, FREET4, T3FREE, THYROIDAB in the last 72 hours. Anemia Panel: Recent Labs    06/20/21 1049  VITAMINB12 270  FOLATE 10.1  FERRITIN 6*  TIBC 339  IRON 13*  RETICCTPCT  2.7   Sepsis Labs: Recent Labs  Lab 06/20/21 0811 06/20/21 1049  PROCALCITON  --  1.92  LATICACIDVEN 1.4 1.2    Recent Results (from the past 240 hour(s))  Culture, blood (Routine x 2)     Status: None (Preliminary result)   Collection Time: 06/20/21  8:11 AM   Specimen: BLOOD  Result Value Ref Range Status   Specimen Description BLOOD RIGHT HAND  Final   Special Requests   Final    BOTTLES DRAWN AEROBIC AND ANAEROBIC Blood Culture adequate volume   Culture   Final    NO GROWTH < 24 HOURS Performed at [facility], [address], [place], [place] [address]    Report Status PENDING  Incomplete  Resp Panel by RT-PCR (Flu A&B, Covid) Nasopharyngeal Swab     Status: None   Collection Time: 06/20/21  8:14 AM   Specimen: Nasopharyngeal Swab; Nasopharyngeal(NP) swabs in vial transport medium  Result Value Ref Range Status   SARS Coronavirus 2 by RT PCR NEGATIVE NEGATIVE Final    Comment: (NOTE) SARS-CoV-2 target nucleic acids are NOT DETECTED.  The SARS-CoV-2 RNA is generally detectable in upper  respiratory specimens during the acute phase of infection. The lowest concentration of SARS-CoV-2 viral copies this assay can detect is 138 copies/mL. A negative result does not preclude SARS-Cov-2 infection and should not be used as the sole basis for treatment or other patient management decisions. A negative result may occur with  improper specimen collection/handling, submission of specimen other than nasopharyngeal swab, presence of viral mutation(s) within the areas targeted by this assay, and inadequate number of viral copies(<138 copies/mL). A negative result must be combined with clinical observations, patient history, and epidemiological information. The expected result is Negative.  Fact Sheet for Patients:  EntrepreneurPulse.com.au  Fact Sheet for Healthcare Providers:  IncredibleEmployment.be  This test is no t yet approved or cleared by the [place] FDA and  has been authorized for detection and/or diagnosis of SARS-CoV-2 by FDA under an Emergency Use Authorization (EUA). This EUA will remain  in effect (meaning this test can be used) for the duration of the COVID-19 declaration under Section 564(b)(1) of the Act, 21 U.S.C.section 360bbb-3(b)(1), unless the authorization is terminated  or revoked sooner.       Influenza A by PCR NEGATIVE NEGATIVE Final   Influenza B by PCR NEGATIVE NEGATIVE Final    Comment: (NOTE) The Xpert Xpress SARS-CoV-2/FLU/RSV plus assay is intended as an aid in the diagnosis of influenza from Nasopharyngeal swab specimens and should not be used as a sole basis for treatment. Nasal washings and aspirates are unacceptable for Xpert Xpress SARS-CoV-2/FLU/RSV testing.  Fact Sheet for Patients: EntrepreneurPulse.com.au  Fact Sheet for Healthcare Providers: IncredibleEmployment.be  This test is not yet approved or cleared by the [place] FDA and has been  authorized for detection and/or diagnosis of SARS-CoV-2 by FDA under an Emergency Use Authorization (EUA). This EUA will remain in effect (meaning this test can be used) for the duration of the COVID-19 declaration under Section 564(b)(1) of the Act, 21 U.S.C. section 360bbb-3(b)(1), unless the authorization is terminated or revoked.  Performed at [facility], [address] [place], [place] [address]   Culture, blood (Routine x 2)     Status: None (Preliminary result)   Collection Time: 06/20/21  8:16 AM   Specimen: BLOOD  Result Value Ref Range Status   Specimen Description BLOOD LEFT [facility]  Final   Special Requests  Final    BOTTLES DRAWN AEROBIC AND ANAEROBIC Blood Culture adequate volume   Culture   Final    NO GROWTH < 24 HOURS Performed at [facility], [address] [place], [place] [address]    Report Status PENDING  Incomplete  MRSA Next Gen by PCR, Nasal     Status: Abnormal   Collection Time: 06/20/21  9:08 PM   Specimen: Nasal Mucosa; Nasal Swab  Result Value Ref Range Status   MRSA by PCR Next Gen DETECTED (A) NOT DETECTED Final    Comment: RESULT CALLED TO, READ BACK BY AND VERIFIED WITH: NALLELY SOSA@2250  06/20/21 RH (NOTE) The GeneXpert MRSA Assay (FDA approved for NASAL specimens only), is one component of a comprehensive MRSA colonization surveillance program. It is not intended to diagnose MRSA infection nor to guide or monitor treatment for MRSA infections. Test performance is not FDA approved in patients less than 40 years old. Performed at [facility], [address], [place], [place] [address]          Radiology Studies: CT HEAD WO CONTRAST (5MM)  Result Date: 06/20/2021 CLINICAL DATA:  Head trauma, minor (Age >= 65y) EXAM: CT HEAD WITHOUT CONTRAST TECHNIQUE: Contiguous axial images were obtained from the base of the skull through the vertex without intravenous contrast. COMPARISON:  CT head 01/29/2007. FINDINGS:  Brain: No evidence of acute large vascular territory infarction, hemorrhage, hydrocephalus, extra-axial collection or mass lesion/mass effect. Small hypodensity in the inferior right basal ganglia, likely a benign dilated perivascular space given characteristic location. Mild atrophy. Vascular: No hyperdense vessel identified. Calcific intracranial atherosclerosis. Skull: No acute fracture. Sinuses/Orbits: Moderate mucosal thickening of the right maxillary sinus. Mild ethmoid air cell mucosal thickening. Unremarkable orbits. Other: No mastoid effusions.  Cerumen in the right EAC. IMPRESSION: No evidence of acute intracranial abnormality. Electronically Signed   By: Margaretha Sheffield M.D.   On: 06/20/2021 09:27   DG Chest Port 1 View  Result Date: 06/20/2021 CLINICAL DATA:  suspected sepsis EXAM: PORTABLE CHEST 1 VIEW COMPARISON:  01/02/2021 FINDINGS: The heart is mildly enlarged. No pulmonary vascular congestion. Lungs are clear. IMPRESSION: Mild cardiomegaly. Electronically Signed   By: Miachel Roux M.D.   On: 06/20/2021 08:45   CT Angio Abd/Pel w/ and/or w/o  Result Date: 06/20/2021 CLINICAL DATA:  Anemia, epigastric abdominal pain EXAM: CTA ABDOMEN AND PELVIS WITHOUT AND WITH CONTRAST TECHNIQUE: Multidetector CT imaging of the abdomen and pelvis was performed using the standard protocol during bolus administration of intravenous contrast. Multiplanar reconstructed images and MIPs were obtained and reviewed to evaluate the vascular anatomy. CONTRAST:  126mL OMNIPAQUE IOHEXOL 350 MG/ML SOLN COMPARISON:  None. FINDINGS: VASCULAR Aorta: Normal caliber aorta without aneurysm, dissection, vasculitis or significant stenosis. Diffuse atherosclerosis. Celiac: Patent without evidence of aneurysm, dissection, vasculitis or significant stenosis. Mild atherosclerosis. SMA: Patent without evidence of aneurysm, dissection, vasculitis or significant stenosis. Moderate atherosclerosis at the origin of the SMA, with less  than 50% stenosis. Renals: Both renal arteries are patent without evidence of aneurysm, dissection, vasculitis, fibromuscular dysplasia or significant stenosis. Moderate atherosclerosis. IMA: Patent without evidence of aneurysm, dissection, vasculitis or significant stenosis. Inflow: Patent without evidence of aneurysm, dissection, vasculitis or significant stenosis. Proximal Outflow: Bilateral common femoral and visualized portions of the superficial and profunda femoral arteries are patent without evidence of aneurysm, dissection, vasculitis or significant stenosis. Veins: IVC filter. Decreased caliber of the bilateral common iliac veins may reflect chronic veno-occlusive disease. Numerous venous collaterals are seen within the abdominal  wall and retroperitoneum. Review of the MIP images confirms the above findings. NON-VASCULAR Lower chest: No acute pleural or parenchymal lung disease. Hepatobiliary: No focal liver abnormality is seen. No gallstones, gallbladder wall thickening, or biliary dilatation. Pancreas: Unremarkable. No pancreatic ductal dilatation or surrounding inflammatory changes. Spleen: Centripetal enhancement of a hypodensity in the superior aspect of the spleen compatible with hemangioma. No evidence of splenomegaly. Adrenals/Urinary Tract: Adrenal glands are unremarkable. Kidneys are normal, without renal calculi, focal lesion, or hydronephrosis. Bladder is decompressed, limiting its evaluation. Stomach/Bowel: Dense stool throughout the colon limits evaluation for active gastrointestinal bleeding. I do not see any intraluminal accumulation of contrast within the bowel to suggest active hemorrhage. No bowel obstruction or ileus. Mild wall thickening of the distal rectum with perirectal fat stranding may reflect proctitis. Scattered diverticulosis within the colon without evidence of diverticulitis. Lymphatic: Borderline enlarged retroperitoneal lymph nodes are identified, measuring up to 10 mm in  the left para-aortic region. No other pathologic adenopathy. Reproductive: Uterus and bilateral adnexa are unremarkable. Other: No free fluid or free intraperitoneal gas. Mild subcutaneous edema throughout the lower anterior abdominal wall and proximal lower extremities. Musculoskeletal: Severe bilateral hip osteoarthritis. No acute or destructive bony lesions. Reconstructed images demonstrate no additional findings. IMPRESSION: VASCULAR 1. No evidence of active gastrointestinal bleeding. Limited evaluation of the colon due to dense stool. 2.  Aortic Atherosclerosis (ICD10-I70.0). NON-VASCULAR 1. Mild distal rectal wall thickening and perirectal fat stranding which could reflect proctitis. 2. Scattered colonic diverticulosis without diverticulitis. 3. Borderline enlarged retroperitoneal lymph nodes, measuring up to 10 mm, nonspecific. 4. Decreased caliber of the bilateral iliac veins and distal IVC below the IVC filter, consistent with chronic occlusion. 5.  Aortic Atherosclerosis (ICD10-I70.0). Electronically Signed   By: Randa Ngo M.D.   On: 06/20/2021 18:08        Scheduled Meds:  sodium chloride   Intravenous Once   Chlorhexidine Gluconate Cloth  6 each Topical Q0600   insulin aspart  0-5 Units Subcutaneous QHS   insulin aspart  0-9 Units Subcutaneous TID WC   mometasone-formoterol  2 puff Inhalation BID   multivitamin with minerals  1 tablet Oral Daily   rosuvastatin  10 mg Oral Daily   Continuous Infusions:  sodium chloride Stopped (06/20/21 1229)   pantoprazole 8 mg/hr (06/21/21 1157)   vancomycin 1,250 mg (06/21/21 1200)     LOS: 1 day    Time spent: 35 minutes    Sidney Ace, MD [facility]   If 7PM-7AM, please contact night-coverage  06/21/2021, 3:13 PM

## 2021-06-21 NOTE — Anesthesia Preprocedure Evaluation (Addendum)
Anesthesia Evaluation  Patient identified by MRN, date of birth, ID band Patient awake    Reviewed: Allergy & Precautions, NPO status , Patient's Chart, lab work & pertinent test results  Airway Mallampati: III  TM Distance: >3 FB Neck ROM: Full    Dental  (+) Edentulous Upper, Missing, Chipped,    Pulmonary shortness of breath and with exertion, neg sleep apnea, former smoker,  H/o cough 1 month Wheeze on exam On [place] in ICU with no respiratory distress  [place]   + wheezing      Cardiovascular Exercise Tolerance: Poor hypertension, Pt. on medications +CHF (chronic diastolic congestive heart failure) and + DOE   Rhythm:Regular Rate:Normal  History of DVT/PE (on February 2022 sp embolectomy and IVC filter placement) on chronic anticoagulation.  Chronic lower extremity lymphedema   Neuro/Psych negative neurological ROS  negative psych ROS   GI/Hepatic Neg liver ROS, GERD  Controlled and Medicated,Symptomatic anemia, IDA, epigastric abdominal pain, heme positive stool   Endo/Other  diabetes, Type 2, Oral Hypoglycemic AgentsMorbid obesity  Renal/GU negative Renal ROS  negative genitourinary   Musculoskeletal negative musculoskeletal ROS (+)   Abdominal   Peds negative pediatric ROS (+)  Hematology  (+) anemia ,   Anesthesia Other Findings Pt was found by EMS on the floor with progressively worsening weakness and lightheadedness. She had a hemoglobin of 3.6 and has been transfused pRBC. She received fact Xa recombinant 12/14. She denies recent bleeding. She has cellulitis, cough and fever being treated with empiric antibiotics.   CT Angio Abd/pelvis : IMPRESSION: VASCULAR  1. No evidence of active gastrointestinal bleeding. Limited evaluation of the colon due to dense stool. 2.  Aortic Atherosclerosis (ICD10-I70.0).  NON-VASCULAR  1. Mild distal rectal wall thickening and perirectal fat stranding which could  reflect proctitis. 2. Scattered colonic diverticulosis without diverticulitis. 3. Borderline enlarged retroperitoneal lymph nodes, measuring up to 10 mm, nonspecific. 4. Decreased caliber of the bilateral iliac veins and distal IVC below the IVC filter, consistent with chronic occlusion. 5.  Aortic Atherosclerosis   Reproductive/Obstetrics H/o endometrial cancer                         Anesthesia Physical Anesthesia Plan  ASA: 4  Anesthesia Plan: General   Post-op Pain Management:    Induction:   PONV Risk Score and Plan: 3 and Propofol infusion and Treatment may vary due to age or medical condition  Airway Management Planned: Nasal CPAP and Natural Airway  Additional Equipment:   Intra-op Plan:   Post-operative Plan:   Informed Consent: I have reviewed the patients History and Physical, chart, labs and discussed the procedure including the risks, benefits and alternatives for the proposed anesthesia with the patient or authorized representative who has indicated his/her understanding and acceptance.     Dental advisory given  Plan Discussed with: CRNA and Anesthesiologist  Anesthesia Plan Comments:        Anesthesia Quick Evaluation

## 2021-06-21 NOTE — Transfer of Care (Signed)
Immediate Anesthesia Transfer of Care Note  Patient: Courtney Mack  Procedure(s) Performed: ESOPHAGOGASTRODUODENOSCOPY (EGD) COLONOSCOPY  Patient Location: PACU  Anesthesia Type:General  Level of Consciousness: awake and sedated  Airway & Oxygen Therapy: Patient Spontanous Breathing and Patient connected to nasal cannula oxygen  Post-op Assessment: Report given to RN and Post -op Vital signs reviewed and stable  Post vital signs: Reviewed and stable  Last Vitals:  Vitals Value Taken Time  BP    Temp    Pulse    Resp    SpO2      Last Pain:  Vitals:   06/21/21 1257  TempSrc: Temporal  PainSc: 0-No pain         Complications: No notable events documented.

## 2021-06-21 NOTE — Plan of Care (Signed)
°  Problem: Clinical Measurements: Goal: Ability to maintain clinical measurements within normal limits will improve Outcome: Progressing Goal: Will remain free from infection Outcome: Progressing Goal: Cardiovascular complication will be avoided Outcome: Progressing   Problem: Pain Managment: Goal: General experience of comfort will improve Outcome: Progressing   Problem: Safety: Goal: Ability to remain free from injury will improve Outcome: Progressing   Problem: Skin Integrity: Goal: Risk for impaired skin integrity will decrease Outcome: Progressing

## 2021-06-21 NOTE — Op Note (Signed)
[facility] Gastroenterology Patient Name: Courtney Mack Procedure Date: 06/21/2021 1:17 PM MRN: [id number] Account #: [id number] Date of Birth: 03-Aug-1955 Admit Type: Inpatient Age: 65 Room: [facility] ENDO ROOM 1 Gender: Female Note Status: Finalized Instrument Name: Colonoscope [id number] Procedure:             Colonoscopy Indications:           Iron deficiency anemia secondary to chronic blood loss Providers:             Benay Pike. Alice Reichert MD, MD Referring MD:          Bridget Hartshorn j. [facility], dr (Referring MD) Medicines:             Propofol per Anesthesia Complications:         No immediate complications. Procedure:             Pre-Anesthesia Assessment:                        - The risks and benefits of the procedure and the                         sedation options and risks were discussed with the                         patient. All questions were answered and informed                         consent was obtained.                        - Patient identification and proposed procedure were                         verified prior to the procedure by the nurse. The                         procedure was verified in the procedure room.                        - ASA Grade Assessment: III - A patient with severe                         systemic disease.                        - After reviewing the risks and benefits, the patient                         was deemed in satisfactory condition to undergo the                         procedure.                        After obtaining informed consent, the colonoscope was                         passed under direct vision. Throughout the procedure,  the patient's blood pressure, pulse, and oxygen                         saturations were monitored continuously. The                         Colonoscope was introduced through the anus and                         advanced to the the terminal ileum, with                          identification of the appendiceal orifice and IC                         valve. The colonoscopy was performed without                         difficulty. The patient tolerated the procedure well.                         The quality of the bowel preparation was good. The                         terminal ileum, ileocecal valve, appendiceal orifice,                         and rectum were photographed. Findings:      The perianal exam findings include internal hemorrhoids that do not       return to the anal canal, thus continuously prolapsed (Grade IV).      The digital rectal exam was normal. Pertinent negatives include normal       sphincter tone.      A few small-mouthed diverticula were found in the sigmoid colon. There       was no evidence of diverticular bleeding.      A 5 mm polyp was found in the cecum. The polyp was sessile. The polyp       was removed with a jumbo cold forceps. Resection and retrieval were       complete.      No other significant abnormalities were identified in a careful       examination of the remainder of the colon.      There is no endoscopic evidence of bleeding, erythema, inflammation,       mass or ulcerations in the entire colon.      Non-bleeding internal hemorrhoids were found during retroflexion. The       hemorrhoids were Grade II (internal hemorrhoids that prolapse but reduce       spontaneously).      The terminal ileum appeared normal.      The exam was otherwise without abnormality. Impression:            - Internal hemorrhoids that do not return to the anal                         canal, thus continuously prolapsed (Grade IV) found on                         perianal exam.                        -  Mild diverticulosis in the sigmoid colon. There was                         no evidence of diverticular bleeding.                        - One 5 mm polyp in the cecum, removed with a jumbo                         cold  forceps. Resected and retrieved.                        - Non-bleeding internal hemorrhoids.                        - The examined portion of the ileum was normal.                        - The examination was otherwise normal. Recommendation:        - Await pathology results from EGD, also performed                         today.                        - Await pathology results.                        - To visualize the small bowel, perform video capsule                         endoscopy at the next available appointment.                        - Return patient to ICU for ongoing care.                        - Clear liquid diet.                        - Continue present medications.                        - Will continue to follow up capsule endoscopy                         results. Dr. Virgina Jock will be rounding this weekend and                         next week for our group ([facility]).                        - The findings and recommendations were discussed with                         the patient. Procedure Code(s):     --- Professional ---                        [phone], Colonoscopy, flexible; with biopsy, single or  multiple Diagnosis Code(s):     --- Professional ---                        K57.30, Diverticulosis of large intestine without                         perforation or abscess without bleeding                        D50.0, Iron deficiency anemia secondary to blood loss                         (chronic)                        K63.5, Polyp of colon                        K64.3, Fourth degree hemorrhoids CPT copyright 2019 American Medical Association. All rights reserved. The codes documented in this report are preliminary and upon coder review may  be revised to meet current compliance requirements. Efrain Sella MD, MD 06/21/2021 2:03:54 PM This report has been signed electronically. Number of Addenda: 0 Note Initiated On: 06/21/2021  1:17 PM Scope Withdrawal Time: 0 hours 7 minutes 19 seconds  Total Procedure Duration: 0 hours 10 minutes 59 seconds  Estimated Blood Loss:  Estimated blood loss: none. Estimated blood loss:                         none. Estimated blood loss: none.      [facility]

## 2021-06-21 NOTE — Op Note (Signed)
[facility] Gastroenterology Patient Name: Courtney Mack Procedure Date: 06/21/2021 1:18 PM MRN: [id number] Account #: [id number] Date of Birth: 06/14/1956 Admit Type: Inpatient Age: 65 Room: [facility] ENDO ROOM 1 Gender: Female Note Status: Finalized Instrument Name: Upper Endoscope [id number] Procedure:             Upper GI endoscopy Indications:           Iron deficiency anemia secondary to chronic blood loss Providers:             Lorie Apley K. Alice Reichert MD, MD Referring MD:          Bridget Hartshorn j. [facility], dr (Referring MD) Medicines:             Propofol per Anesthesia Complications:         No immediate complications. Procedure:             Pre-Anesthesia Assessment:                        - The risks and benefits of the procedure and the                         sedation options and risks were discussed with the                         patient. All questions were answered and informed                         consent was obtained.                        - Patient identification and proposed procedure were                         verified prior to the procedure by the nurse. The                         procedure was verified in the procedure room.                        - ASA Grade Assessment: III - A patient with severe                         systemic disease.                        - After reviewing the risks and benefits, the patient                         was deemed in satisfactory condition to undergo the                         procedure.                        After obtaining informed consent, the endoscope was                         passed under direct vision. Throughout the procedure,  the patient's blood pressure, pulse, and oxygen                         saturations were monitored continuously. The Endoscope                         was introduced through the mouth, and advanced to the                         third part of  duodenum. The upper GI endoscopy was                         accomplished without difficulty. The patient tolerated                         the procedure well. Findings:      Patchy, white plaques were found in the lower third of the esophagus.       Cells for cytology were obtained by brushing.      Localized severe inflammation characterized by congestion (edema),       erosions, erythema and friability was found in the prepyloric region of       the stomach. Biopsies were taken with a cold forceps for Helicobacter       pylori testing.      A 1 cm hiatal hernia was present.      The examined duodenum was normal.      The exam was otherwise without abnormality. Impression:            - Esophageal plaques were found, consistent with                         candidiasis. Cells for cytology obtained.                        - Gastritis. Biopsied.                        - 1 cm hiatal hernia.                        - Normal examined duodenum.                        - The examination was otherwise normal. Recommendation:        - Await pathology results.                        - Proceed with colonoscopy Procedure Code(s):     --- Professional ---                        [phone], Esophagogastroduodenoscopy, flexible,                         transoral; with biopsy, single or multiple Diagnosis Code(s):     --- Professional ---                        D50.0, Iron deficiency anemia secondary to blood loss                         (  chronic)                        K44.9, Diaphragmatic hernia without obstruction or                         gangrene                        K29.70, Gastritis, unspecified, without bleeding                        K22.9, Disease of esophagus, unspecified CPT copyright 2019 American Medical Association. All rights reserved. The codes documented in this report are preliminary and upon coder review may  be revised to meet current compliance requirements. Efrain Sella MD,  MD 06/21/2021 1:35:15 PM This report has been signed electronically. Number of Addenda: 0 Note Initiated On: 06/21/2021 1:18 PM Estimated Blood Loss:  Estimated blood loss: none.      [facility]

## 2021-06-21 NOTE — Progress Notes (Signed)
Initial Nutrition Assessment ° °DOCUMENTATION CODES:  ° °Morbid obesity ° °INTERVENTION:  ° °RD will add supplements once pt's diet is advanced  ° °Recommend MVI po daily  ° °Recommend vitamin C 500mg po BID  ° °NUTRITION DIAGNOSIS:  ° °Inadequate oral intake related to acute illness as evidenced by per patient/family report. ° °GOAL:  ° °Patient will meet greater than or equal to 90% of their needs ° °MONITOR:  ° °PO intake, Supplement acceptance, Labs, Weight trends, I & O's, Skin ° °REASON FOR ASSESSMENT:  ° °Malnutrition Screening Tool °  ° °ASSESSMENT:  ° °65 y.o. female with medical history significant of DVT/PE on Xarelto, hypertension, hyperlipidemia, diabetes mellitus, anemia, obesity, endometrial cancer, dCHF and morbid obesity who presents with cellulitis, sepsis and anemia. ° °Met with pt in room today. Pt reports poor oral intake for 4-5 days pta r/t abdominal pain. Pt currently NPO for EGD/colonoscopy today. Per chart, pt appears weight stable at baseline. Pt reports that she is willing to drink chocolate Ensure once her diet is advanced. RD will add supplements and vitamins to help pt meet her estimated needs and to support wound healing.  ° °Medications reviewed and include: insulin, MVI, ceftriaxone, protonix, vancomycin  ° °Labs reviewed: K 3.6 wnl °Iron 13.0(L), TIBC 339, ferritin 6.0(L), folate 10.1, B12 270- 12/14 °WBC- 12.2(H), hgb 8.1(L), Hct 25.3(L), MCV 73.8(L), MCH 23.6(L) °Cbgs- 93, 94 x 24 hrs ° °NUTRITION - FOCUSED PHYSICAL EXAM: ° °Flowsheet Row Most Recent Value  °Orbital Region No depletion  °Upper Arm Region No depletion  °Thoracic and Lumbar Region No depletion  °Buccal Region No depletion  °Temple Region No depletion  °Clavicle Bone Region No depletion  °Clavicle and Acromion Bone Region No depletion  °Scapular Bone Region No depletion  °Dorsal Hand No depletion  °Patellar Region No depletion  °Anterior Thigh Region No depletion  °Posterior Calf Region No depletion  °Edema (RD  Assessment) Severe  °Hair Reviewed  °Eyes Reviewed  °Mouth Reviewed  °Skin Reviewed  °Nails Reviewed  ° °Diet Order:   °Diet Order   ° °       °  Diet NPO time specified  Diet effective midnight       °  ° °  °  ° °  ° °EDUCATION NEEDS:  ° °Education needs have been addressed ° °Skin:  Skin Assessment: Reviewed RN Assessment (ecchymosis, Stage II pressure injury buttocks) ° °Last BM:  12/15- type 7 ° °Height:  ° °Ht Readings from Last 1 Encounters:  °06/20/21 4' 11" (1.499 m)  ° ° °Weight:  ° °Wt Readings from Last 1 Encounters:  °06/21/21 110.2 kg  ° ° °Ideal Body Weight:  44.5 kg ° °BMI:  Body mass index is 49.07 kg/m². ° °Estimated Nutritional Needs:  ° °Kcal:  2000-2300kcal/day ° °Protein:  100-115g/day ° °Fluid:  1.4-1.6L/day ° °Casey Campbell MS, RD, LDN °Please refer to [facility] for RD and/or RD on-call/weekend/after hours pager ° °

## 2021-06-21 NOTE — Interval H&P Note (Signed)
History and Physical Interval Note:  06/21/2021 1:09 PM  Courtney Mack  has presented today for surgery, with the diagnosis of Symptomatic anemia, IDA, epigastric abdominal pain, heme positive stool.  The various methods of treatment have been discussed with the patient and family. After consideration of risks, benefits and other options for treatment, the patient has consented to  Procedure(s): ESOPHAGOGASTRODUODENOSCOPY (EGD) (N/A) COLONOSCOPY (N/A) as a surgical intervention.  The patient's history has been reviewed, patient examined, no change in status, stable for surgery.  I have reviewed the patient's chart and labs.  Questions were answered to the patient's satisfaction.     Franquez, [place]

## 2021-06-21 NOTE — Progress Notes (Signed)
Pharmacy Antibiotic Note  Courtney Mack is a 65 y.o. female with PMH of T2DM, h/o DVT, HLD, diaCHF, cellulitis of the lower extremity, morbid obesity, endometrial CA, 1 HTN who was admitted on 06/20/2021 fever and left lower leg pain . Pharmacy has been consulted for vancomycin dosing for cellulitis.  Afebrile, WBC 13.8>12.5>12.2, Bcx NG <24h, MRSA PCR detected   Following results of MRSA PCR, ceftriaxone was discontinued   Plan: Vancomycin Pt received vancomycin 2250 mg IV x 1 loading dose in ED Will continue vancomycin 1250 mg IV q24h Est AUC: 549.9 Used: Scr 0.85, IBW, Vd 0.5 Obtain vanc level around 4th or 5th dose if continued Will continue to monitor renal function and adjust dose as clinically indicated   Height: 4\' 11"  (149.9 cm) Weight: 110.2 kg (242 lb 15.2 oz) IBW/kg (Calculated) : 43.2  Temp (24hrs), Avg:98.3 F (36.8 C), Min:97.8 F (36.6 C), Max:98.9 F (37.2 C)  Recent Labs  Lab 06/20/21 0811 06/20/21 1049 06/21/21 0230  WBC 14.2* 13.8* 12.5*  CREATININE 0.71  --  0.85  LATICACIDVEN 1.4 1.2  --      Estimated Creatinine Clearance: 71.9 mL/min (by C-G formula based on SCr of 0.85 mg/dL).    No Known Allergies  Antimicrobials this admission: Ongoing 12/14 vancomycin >>   Completed 12/14 cefepime/metronidazole x1 12/14 ceftriaxone >> 12/15   Microbiology results: 12/14 BCx: NG <24h 12/14 MRSA PCR: detected   Thank you for allowing pharmacy to be a part of this patients care.  Narda Rutherford, PharmD Pharmacy Resident  06/21/2021 12:30 PM

## 2021-06-21 NOTE — Anesthesia Procedure Notes (Signed)
Date/Time: 06/21/2021 1:46 PM Performed by: Vaughan Sine Pre-anesthesia Checklist: Patient identified, Emergency Drugs available, Suction available, Patient being monitored and Timeout performed Patient Re-evaluated:Patient Re-evaluated prior to induction Oxygen Delivery Method: Supernova nasal CPAP Preoxygenation: Pre-oxygenation with 100% oxygen Induction Type: IV induction Airway Equipment and Method: Bite block Placement Confirmation: positive ETCO2 and CO2 detector

## 2021-06-22 ENCOUNTER — Encounter
Admission: EM | Disposition: A | Discharge status: Home Health | Payer: Self-pay | Source: Home / Self Care | Attending: Internal Medicine

## 2021-06-22 ENCOUNTER — Encounter: Payer: Self-pay | Admitting: Internal Medicine

## 2021-06-22 HISTORY — PX: GIVENS CAPSULE STUDY: SHX5432

## 2021-06-22 LAB — BASIC METABOLIC PANEL
Anion gap: 5 (ref 5–15)
BUN: 17 mg/dL (ref 8–23)
CO2: 24 mmol/L (ref 22–32)
Calcium: 8.1 mg/dL — ABNORMAL LOW (ref 8.9–10.3)
Chloride: 106 mmol/L (ref 98–111)
Creatinine, Ser: 0.83 mg/dL (ref 0.44–1.00)
GFR, Estimated: >60 mL/min (ref >60)
Glucose, Bld: 93 mg/dL (ref 70–99)
Potassium: 3.3 mmol/L — ABNORMAL LOW (ref 3.5–5.1)
Sodium: 135 mmol/L (ref 135–145)

## 2021-06-22 LAB — GLUCOSE, CAPILLARY
Glucose-Capillary: 98 mg/dL (ref 70–99)
Glucose-Capillary: 95 mg/dL (ref 70–99)
Glucose-Capillary: 119 mg/dL — ABNORMAL HIGH (ref 70–99)
Glucose-Capillary: 113 mg/dL — ABNORMAL HIGH (ref 70–99)

## 2021-06-22 LAB — CBC
HCT: 25 % — ABNORMAL LOW (ref 36.0–46.0)
Hemoglobin: 7.9 g/dL — ABNORMAL LOW (ref 12.0–15.0)
MCH: 23.7 pg — ABNORMAL LOW (ref 26.0–34.0)
MCHC: 31.6 g/dL (ref 30.0–36.0)
MCV: 75.1 fL — ABNORMAL LOW (ref 80.0–100.0)
Platelets: 166 10*3/uL (ref 150–400)
RBC: 3.33 MIL/uL — ABNORMAL LOW (ref 3.87–5.11)
RDW: 21 % — ABNORMAL HIGH (ref 11.5–15.5)
WBC: 8.5 10*3/uL (ref 4.0–10.5)
nRBC: 0 % (ref 0.0–0.2)

## 2021-06-22 SURGERY — IMAGING PROCEDURE, GI TRACT, INTRALUMINAL, VIA CAPSULE

## 2021-06-22 MED ORDER — PANTOPRAZOLE SODIUM 40 MG IV SOLR
40.0000 mg | Freq: Two times a day (BID) | INTRAVENOUS | Status: DC
  Administered 2021-06-22: 40 mg via INTRAVENOUS
  Filled 2021-06-22: qty 40

## 2021-06-22 MED ORDER — PANTOPRAZOLE SODIUM 40 MG PO TBEC
40.0000 mg | DELAYED_RELEASE_TABLET | Freq: Every day | ORAL | Status: DC
  Administered 2021-06-22 – 2021-06-23 (×2): 40 mg via ORAL
  Filled 2021-06-22 (×2): qty 1

## 2021-06-22 MED ORDER — ASCORBIC ACID 500 MG PO TABS
500.0000 mg | ORAL_TABLET | Freq: Two times a day (BID) | ORAL | Status: DC
  Administered 2021-06-22 – 2021-06-26 (×8): 500 mg via ORAL
  Filled 2021-06-22 (×8): qty 1

## 2021-06-22 MED ORDER — POTASSIUM CHLORIDE 10 MEQ/100ML IV SOLN
10.0000 meq | INTRAVENOUS | Status: AC
  Administered 2021-06-22 (×4): 10 meq via INTRAVENOUS
  Filled 2021-06-22 (×4): qty 100

## 2021-06-22 MED ORDER — FLUCONAZOLE 100 MG PO TABS
200.0000 mg | ORAL_TABLET | Freq: Every day | ORAL | Status: DC
  Administered 2021-06-23 – 2021-06-26 (×4): 200 mg via ORAL
  Filled 2021-06-22 (×4): qty 2

## 2021-06-22 MED ORDER — FLUCONAZOLE 100 MG PO TABS
400.0000 mg | ORAL_TABLET | Freq: Once | ORAL | Status: AC
  Administered 2021-06-22: 400 mg via ORAL
  Filled 2021-06-22: qty 4

## 2021-06-22 MED ORDER — BOOST / RESOURCE BREEZE PO LIQD CUSTOM
1.0000 | Freq: Three times a day (TID) | ORAL | Status: DC
  Administered 2021-06-22: 1 via ORAL

## 2021-06-22 MED ORDER — ENSURE ENLIVE PO LIQD
237.0000 mL | Freq: Three times a day (TID) | ORAL | Status: DC
  Administered 2021-06-22 – 2021-06-25 (×5): 237 mL via ORAL

## 2021-06-22 NOTE — Progress Notes (Incomplete)
Pharmacy Antibiotic Note  Courtney Mack is a 65 y.o. female with PMH of T2DM, h/o DVT, HLD, diaCHF, cellulitis of the lower extremity, morbid obesity, endometrial CA, 1 HTN who was admitted on 06/20/2021 fever and left lower leg pain . Pharmacy has been consulted for vancomycin dosing for cellulitis.  Afebrile, WBC 13.8>12.5>12.2, Bcx NG <24h, MRSA PCR detected   Following results of MRSA PCR, ceftriaxone was discontinued   Plan: Vancomycin Pt received vancomycin 2250 mg IV x 1 loading dose in ED Will continue vancomycin 1250 mg IV q24h Est AUC: 549.9 Used: Scr 0.85, IBW, Vd 0.5 Obtain vanc level around 4th or 5th dose if continued Will continue to monitor renal function and adjust dose as clinically indicated   Height: 4\' 11"  (149.9 cm) Weight: 105.1 kg (231 lb 11.3 oz) IBW/kg (Calculated) : 43.2  Temp (24hrs), Avg:97.8 F (36.6 C), Min:96.5 F (35.8 C), Max:98.2 F (36.8 C)  Recent Labs  Lab 06/20/21 0811 06/20/21 1049 06/21/21 0230 06/21/21 1009 06/21/21 1542 06/21/21 2207 06/22/21 0457  WBC 14.2* 13.8* 12.5* 12.2* 12.6* 10.3 8.5  CREATININE 0.71  --  0.85  --   --   --  0.83  LATICACIDVEN 1.4 1.2  --   --   --   --   --      Estimated Creatinine Clearance: 71.6 mL/min (by C-G formula based on SCr of 0.83 mg/dL).    No Known Allergies  Antimicrobials this admission: Ongoing 12/14 vancomycin >>   Completed 12/14 cefepime/metronidazole x1 12/14 ceftriaxone >> 12/15   Microbiology results: 12/14 BCx: NG <24h 12/14 MRSA PCR: detected   Thank you for allowing pharmacy to be a part of this patients care.  Narda Rutherford, PharmD Pharmacy Resident  06/22/2021 8:07 AM

## 2021-06-22 NOTE — Anesthesia Postprocedure Evaluation (Addendum)
Anesthesia Post Note  Patient: Courtney Mack  Procedure(s) Performed: ESOPHAGOGASTRODUODENOSCOPY (EGD) COLONOSCOPY  Patient location during evaluation: Endoscopy Anesthesia Type: General Level of consciousness: awake and alert Pain management: pain level controlled Vital Signs Assessment: post-procedure vital signs reviewed and stable Respiratory status: spontaneous breathing, nonlabored ventilation, respiratory function stable and patient connected to nasal cannula oxygen Cardiovascular status: blood pressure returned to baseline and stable Postop Assessment: no apparent nausea or vomiting Anesthetic complications: no   No notable events documented.   Last Vitals:  Vitals:   06/22/21 1000 06/22/21 1100  BP: (!) 102/48 (!) 120/55  Pulse: 70 92  Resp: (!) 22 18  Temp:    SpO2: 99% 98%    Last Pain:  Vitals:   06/22/21 0800  TempSrc: Oral  PainSc: 0-No pain                 [place] Ouch

## 2021-06-22 NOTE — Progress Notes (Signed)
Pharmacy Antibiotic Note  Courtney Mack is a 65 y.o. female with PMH of T2DM, h/o DVT, HLD, diaCHF, cellulitis of the lower extremity, morbid obesity, endometrial CA, 1 HTN who was admitted on 06/20/2021 fever and left lower leg pain . Pharmacy has been consulted for vancomycin dosing for cellulitis.  Afebrile, WBC 10.3>8.5, Bcx NG x1 day, MRSA PCR detected    Plan: Vancomycin Will continue vancomycin 1250 mg IV q24h Est AUC: 564 Used: Scr 0.83, IBW, Vd 0.5 Pt may require NPO status for possible procedure, will reassess 12/17 for de-escalation to PO ABX w/ [place]-MR[place] coverage Obtain vanc level around 4th or 5th dose if continued Will continue to monitor renal function and adjust dose as clinically indicated   Height: 4\' 11"  (149.9 cm) Weight: 105.1 kg (231 lb 11.3 oz) IBW/kg (Calculated) : 43.2  Temp (24hrs), Avg:97.9 F (36.6 C), Min:96.5 F (35.8 C), Max:98.9 F (37.2 C)  Recent Labs  Lab 06/20/21 0811 06/20/21 1049 06/21/21 0230 06/21/21 1009 06/21/21 1542 06/21/21 2207 06/22/21 0457  WBC 14.2* 13.8* 12.5* 12.2* 12.6* 10.3 8.5  CREATININE 0.71  --  0.85  --   --   --  0.83  LATICACIDVEN 1.4 1.2  --   --   --   --   --      Estimated Creatinine Clearance: 71.6 mL/min (by C-G formula based on SCr of 0.83 mg/dL).    No Known Allergies  Antimicrobials this admission: Ongoing 12/14 vancomycin >>   Completed 12/14 cefepime/metronidazole x1 12/14 ceftriaxone >> 12/15   Microbiology results: 12/14 BCx: NG x1 day 12/14 MRSA PCR: detected   Thank you for allowing pharmacy to be a part of this patients care.  Narda Rutherford, PharmD Pharmacy Resident  06/22/2021 12:00 PM

## 2021-06-22 NOTE — Progress Notes (Signed)
GI Inpatient Short Progress Note  Subjective:  Patient seen in follow-up today for iron-deficiency anemia and symptomatic anemia. She is s/p bidirectional endoscopy yesterday afternoon with Dr. Alice Reichert which were overall unrevealing. She is having video capsule endoscopy today. She swallowed the pill capsule without difficulty. She denies any acute complaints today. She has not had any overt gastrointestinal bleeding. She denies any abdominal pain.   Scheduled Inpatient Medications:   sodium chloride   Intravenous Once   vitamin C  500 mg Oral BID   Chlorhexidine Gluconate Cloth  6 each Topical Q0600   feeding supplement  237 mL Oral TID BM   insulin aspart  0-5 Units Subcutaneous QHS   insulin aspart  0-9 Units Subcutaneous TID WC   mometasone-formoterol  2 puff Inhalation BID   multivitamin with minerals  1 tablet Oral Daily   pantoprazole (PROTONIX) IV  40 mg Intravenous Q12H   rosuvastatin  10 mg Oral Daily    Continuous Inpatient Infusions:    sodium chloride Stopped (06/20/21 1229)   sodium chloride     vancomycin 1,250 mg (06/22/21 1206)    PRN Inpatient Medications:  acetaminophen, albuterol, dextromethorphan-guaiFENesin, hydrALAZINE, ondansetron (ZOFRAN) IV  Review of Systems: Constitutional: Weight is stable.  Eyes: No changes in vision. ENT: No oral lesions, sore throat.  GI: see HPI.  Heme/Lymph: No easy bruising.  CV: No chest pain.  GU: No hematuria.  Integumentary: No rashes.  Neuro: No headaches.  Psych: No depression/anxiety.  Endocrine: No heat/cold intolerance.  Allergic/Immunologic: No urticaria.  Resp: No cough, SOB.  Musculoskeletal: No joint swelling.    Physical Examination: BP (!) 100/56 (BP Location: Left Arm)    Pulse 83    Temp 98 F (36.7 C) (Oral)    Resp 18    Ht 4\' 11"  (1.499 m)    Wt 105.1 kg    SpO2 100%    BMI 46.80 kg/m  Gen: NAD, alert and oriented x 4 HEENT: PEERLA, EOMI, Neck: supple, no JVD or thyromegaly Chest: CTA  bilaterally, no wheezes, crackles, or other adventitious sounds CV: RRR, no m/g/c/r Abd: soft, NT, ND, +BS in all four quadrants; no HSM, guarding, ridigity, or rebound tenderness Ext: no edema, well perfused with 2+ pulses, Skin: no rash or lesions noted Lymph: no LAD  Data: Lab Results  Component Value Date   WBC 8.5 06/22/2021   HGB 7.9 (L) 06/22/2021   HCT 25.0 (L) 06/22/2021   MCV 75.1 (L) 06/22/2021   PLT 166 06/22/2021   Recent Labs  Lab 06/21/21 1542 06/21/21 2207 06/22/21 0457  HGB 8.6* 7.6* 7.9*   Lab Results  Component Value Date   NA 135 06/22/2021   K 3.3 (L) 06/22/2021   CL 106 06/22/2021   CO2 24 06/22/2021   BUN 17 06/22/2021   CREATININE 0.83 06/22/2021   Lab Results  Component Value Date   ALT 8 06/20/2021   AST 14 (L) 06/20/2021   ALKPHOS 49 06/20/2021   BILITOT 1.8 (H) 06/20/2021   Recent Labs  Lab 06/20/21 0811  APTT 42*  INR 1.7*   Assessment/Plan:  65 y/o Caucasian female with a PMH of HTN, HLD, morbid obesity, diabetes mellitus, Hx of endometrial cancer, dCHF, and Hx of DVT/PE on Xarelto presented to the [facility] ED this am for chief complaint of generalized weakness, cough, fever, and left lower leg pain. GI consulted in context of profound anemia.    Profound anemia/IDA - s/p EGD and colonoscopy yesterday with Dr. Alice Reichert  which were unrevealing. No identifiable cause of anemia. Video capsule endoscopy today.   Epigastric abdominal pain - raises concern for peptic etiologies (PUD/gastritis)   Sepsis - likely 2/2 left lower extremity cellulitis, improving, on vancomycin   Hx of DVT/PE - holding Xarelto in setting of presumed chronic GIB  Recommendations:  - H&H stable with no signs of overt gastrointestinal bleeding - Transfuse for Hgb <7.0.  - Continue PPI for gastric protection - Continue to hold Xarelto. Avoid NSAIDs.  - VCE planned for today. Dr. Virgina Jock will read the capsule either tonight or tomorrow morning - Further  recommendations after VCE read. See procedure note for findings and further recommendations.  - Advance diet as tolerated   Please call with questions or concerns.   Octavia Bruckner, PA-C [facility] [phone] (Cell)

## 2021-06-22 NOTE — Progress Notes (Signed)
PROGRESS NOTE    Courtney Mack  BPZ:[id number] DOB: May 19, 1956 DOA: 06/20/2021 PCP: [facility] [place]    Brief Narrative:  65 y.o. female with medical history significant of DVT/PE on Xarelto, hypertension, hyperlipidemia, diabetes mellitus, anemia, obesity, endometrial cancer, dCHF, morbid obesity, who presents with weakness, cough, fever and left lower  leg pain.   Patient states that she has intermittent dark stool which has been going on for several weeks.  She developed generalized weakness. She states that she felt lightheadedness and fell on the floor this morning.  No loss of consciousness. No head or neck injury. She also reports left lower leg pain. Her left lower leg is mild erythematous.  Patient has fever and chills.  Patient reports cough and mild shortness breath.  Denies chest pain.  No nausea, vomiting, diarrhea or abdominal pain.  Denies symptoms of UTI.  Patient moves all extremities.  Schedule for EGD and colonoscopy on 12/15 with GI.  Underwent successfully.  Nonbleeding ulcer identified.  Unclear source of blood loss.  Hemoglobin is remained stable post procedure.  He had capsule endoscopy completed and is pending read.  Per GI okay to slowly advance diet.   Assessment & Plan:   Principal Problem:   GI bleeding Active Problems:   Pulmonary embolism (HCC)   Diabetes mellitus without complication (HCC)   Acute blood loss anemia   DVT (deep venous thrombosis) (HCC)   Symptomatic anemia   Sepsis (HCC)   HLD (hyperlipidemia)   Fall at home, initial encounter   Cough   Chronic diastolic CHF (congestive heart failure) (HCC)   Cellulitis of left lower leg   Pressure injury of skin  GI bleeding, acute blood loss anemia and symptomatic anemia Hgb dropped from 10.7 --> 4.0 --> 3.6.  consulted Dr. Alice Reichert of GI Also consulted Dr. Lucky Cowboy of [facility].  Initially  NM blood loss scan is ordered, but since pt dose not have active bleeding, Dr. Lucky Cowboy d/c'ed NM  blood loss scan test Status post 1 dose Kcentra to reverse Xarelto effect Status post EGD colonoscopy, no bleed noted Status post video endoscopy, read pending Patient stable Has received 4 units PRBC total Plan: Transfer to medical telemetry DC Protonix drip Start IV PPI twice daily Continue holding NSAIDs and Xarelto Monitor and transfuse PRBC hemoglobin less than 7   Hx of Pulmonary embolism and DVT pt is on Xarelto Pt is s/p of IVC filter on 08/18/20 Consulted Dr. Lucky Cowboy of [facility]. -f/u Holding Xarelto in setting of bleed -Appreciate vascular surgery recommendations  Diabetes mellitus without complication ([place]) Recent A1c 6.2, well controlled.   Patient taking metformin at home -Sliding scale insulin   Sepsis due to Cellulitis of left lower leg  Patient feels great.   For sepsis with WBC 14.2, tachycardia with heart rate 105, RR 33, patient also has a fever 100.2. MRSA screen positive Plan: Continue vancomycin pharmacy dosing assistance Follow blood cultures, NGTD Monitor vitals and fever curve   HLD (hyperlipidemia) -Crestor   Fall at home, initial encounter CT head negative. -pt/ot when able to (not ordered)   Cough Chest x-ray negative. -Supportive care, as needed Mucinex and albuterol   HTN -hold Cozaar since patient is at high risk of developing hypotension due to severe anemia -IV hydralazine as needed   Chronic diastolic CHF (congestive heart failure) ([place])  2D echo on 08/19/2020 showed EF 60 to 65% with grade 1 diastolic dysfunction.  Chest x-ray is negative for pulmonary edema, does not seem  to have CHF exacerbation. Scherrie Merritts give as needed Lasix while patient is getting blood transfusion -check BNP   DVT prophylaxis: None.  Chemoprophylaxis contraindicated.  SCD contraindicated given history of DVT Code Status: Full Family Communication: None today Disposition Plan: Status is: Inpatient  Remains inpatient appropriate because: Severe acute on chronic  blood loss anemia       Level of care: Telemetry Medical  Consultants:  GI Vascular surgery  Procedures:  EGD 12/15 Colonoscopy 12/15  Antimicrobials: None   Subjective: Patient seen and examined.  Resting comfortably in bed.  No complaints.  Objective: Vitals:   06/22/21 0800 06/22/21 0900 06/22/21 1000 06/22/21 1100  BP: (!) 108/59 (!) 115/48 (!) 102/48 (!) 120/55  Pulse: 76 75 70 92  Resp: 15 (!) 24 (!) 22 18  Temp: 98.9 F (37.2 C)     TempSrc: Oral     SpO2: 97% 100% 99% 98%  Weight:      Height:        Intake/Output Summary (Last 24 hours) at 06/22/2021 1451 Last data filed at 06/22/2021 0631 Gross per 24 hour  Intake 997.5 ml  Output 575 ml  Net 422.5 ml   Filed Weights   06/21/21 0500 06/21/21 1257 06/22/21 0403  Weight: 110.2 kg 110.2 kg 105.1 kg    Examination:  General exam: No acute distress Respiratory system: Lungs clear.  Normal work of breathing.  Room air Cardiovascular system: S1-S2, RRR, no murmurs Gastrointestinal system: Soft, NT/ND, normal bowel sounds Central nervous system: Alert and oriented. No focal neurological deficits. Extremities: Mild bilateral lower leg edema.  LLE with mild erythema.  Tenderness to touch Skin: No rashes, lesions or ulcers Psychiatry: Judgement and insight appear normal. Mood & affect appropriate.     Data Reviewed: I have personally reviewed following labs and imaging studies  CBC: Recent Labs  Lab 06/20/21 0811 06/20/21 1049 06/21/21 0230 06/21/21 1009 06/21/21 1542 06/21/21 2207 06/22/21 0457  WBC 14.2*   < > 12.5* 12.2* 12.6* 10.3 8.5  NEUTROABS 12.9*  --   --   --   --   --   --   HGB 4.0*   < > 7.9* 8.1* 8.6* 7.6* 7.9*  HCT 14.9*   < > 24.8* 25.3* 27.7* 24.1* 25.0*  MCV 66.8*   < > 73.4* 73.8* 74.9* 75.1* 75.1*  PLT 282   < > 191 169 168 165 166   < > = values in this interval not displayed.   Basic Metabolic Panel: Recent Labs  Lab 06/20/21 0811 06/21/21 0230 06/22/21 0457   NA 136  --  135  K 3.6  --  3.3*  CL 107  --  106  CO2 23  --  24  GLUCOSE 122*  --  93  BUN 14  --  17  CREATININE 0.71 0.85 0.83  CALCIUM 8.6*  --  8.1*   GFR: Estimated Creatinine Clearance: 71.6 mL/min (by C-G formula based on SCr of 0.83 mg/dL). Liver Function Tests: Recent Labs  Lab 06/20/21 0811  AST 14*  ALT 8  ALKPHOS 49  BILITOT 1.8*  PROT 6.6  ALBUMIN 2.8*   No results for input(s): LIPASE, AMYLASE in the last 168 hours. No results for input(s): AMMONIA in the last 168 hours. Coagulation Profile: Recent Labs  Lab 06/20/21 0811  INR 1.7*   Cardiac Enzymes: No results for input(s): CKTOTAL, CKMB, CKMBINDEX, TROPONINI in the last 168 hours. BNP (last 3 results) No results for input(s): PROBNP in  the last 8760 hours. HbA1C: No results for input(s): HGBA1C in the last 72 hours. CBG: Recent Labs  Lab 06/21/21 1302 06/21/21 1514 06/21/21 2029 06/22/21 0718 06/22/21 1146  GLUCAP 86 99 98 98 95   Lipid Profile: No results for input(s): CHOL, HDL, LDLCALC, TRIG, CHOLHDL, LDLDIRECT in the last 72 hours. Thyroid Function Tests: No results for input(s): TSH, T4TOTAL, FREET4, T3FREE, THYROIDAB in the last 72 hours. Anemia Panel: Recent Labs    06/20/21 1049  VITAMINB12 270  FOLATE 10.1  FERRITIN 6*  TIBC 339  IRON 13*  RETICCTPCT 2.7   Sepsis Labs: Recent Labs  Lab 06/20/21 0811 06/20/21 1049  PROCALCITON  --  1.92  LATICACIDVEN 1.4 1.2    Recent Results (from the past 240 hour(s))  Culture, blood (Routine x 2)     Status: None (Preliminary result)   Collection Time: 06/20/21  8:11 AM   Specimen: BLOOD  Result Value Ref Range Status   Specimen Description BLOOD RIGHT HAND  Final   Special Requests   Final    BOTTLES DRAWN AEROBIC AND ANAEROBIC Blood Culture adequate volume   Culture   Final    NO GROWTH < 24 HOURS Performed at [facility], [address] [place], [place] [address]    Report Status PENDING  Incomplete  Resp  Panel by RT-PCR (Flu A&B, Covid) Nasopharyngeal Swab     Status: None   Collection Time: 06/20/21  8:14 AM   Specimen: Nasopharyngeal Swab; Nasopharyngeal(NP) swabs in vial transport medium  Result Value Ref Range Status   SARS Coronavirus 2 by RT PCR NEGATIVE NEGATIVE Final    Comment: (NOTE) SARS-CoV-2 target nucleic acids are NOT DETECTED.  The SARS-CoV-2 RNA is generally detectable in upper respiratory specimens during the acute phase of infection. The lowest concentration of SARS-CoV-2 viral copies this assay can detect is 138 copies/mL. A negative result does not preclude SARS-Cov-2 infection and should not be used as the sole basis for treatment or other patient management decisions. A negative result may occur with  improper specimen collection/handling, submission of specimen other than nasopharyngeal swab, presence of viral mutation(s) within the areas targeted by this assay, and inadequate number of viral copies(<138 copies/mL). A negative result must be combined with clinical observations, patient history, and epidemiological information. The expected result is Negative.  Fact Sheet for Patients:  EntrepreneurPulse.com.au  Fact Sheet for Healthcare Providers:  IncredibleEmployment.be  This test is no t yet approved or cleared by the [place] FDA and  has been authorized for detection and/or diagnosis of SARS-CoV-2 by FDA under an Emergency Use Authorization (EUA). This EUA will remain  in effect (meaning this test can be used) for the duration of the COVID-19 declaration under Section 564(b)(1) of the Act, 21 U.S.C.section 360bbb-3(b)(1), unless the authorization is terminated  or revoked sooner.       Influenza A by PCR NEGATIVE NEGATIVE Final   Influenza B by PCR NEGATIVE NEGATIVE Final    Comment: (NOTE) The Xpert Xpress SARS-CoV-2/FLU/RSV plus assay is intended as an aid in the diagnosis of influenza from Nasopharyngeal  swab specimens and should not be used as a sole basis for treatment. Nasal washings and aspirates are unacceptable for Xpert Xpress SARS-CoV-2/FLU/RSV testing.  Fact Sheet for Patients: EntrepreneurPulse.com.au  Fact Sheet for Healthcare Providers: IncredibleEmployment.be  This test is not yet approved or cleared by the [place] FDA and has been authorized for detection and/or diagnosis of SARS-CoV-2 by FDA under an Emergency Use  Authorization (EUA). This EUA will remain in effect (meaning this test can be used) for the duration of the COVID-19 declaration under Section 564(b)(1) of the Act, 21 U.S.C. section 360bbb-3(b)(1), unless the authorization is terminated or revoked.  Performed at [facility], [address] [place], [place] [address]   Culture, blood (Routine x 2)     Status: None (Preliminary result)   Collection Time: 06/20/21  8:16 AM   Specimen: BLOOD  Result Value Ref Range Status   Specimen Description BLOOD LEFT AC  Final   Special Requests   Final    BOTTLES DRAWN AEROBIC AND ANAEROBIC Blood Culture adequate volume   Culture   Final    NO GROWTH < 24 HOURS Performed at [facility], [address], [place], [place] [address]    Report Status PENDING  Incomplete  MRSA Next Gen by PCR, Nasal     Status: Abnormal   Collection Time: 06/20/21  9:08 PM   Specimen: Nasal Mucosa; Nasal Swab  Result Value Ref Range Status   MRSA by PCR Next Gen DETECTED (A) NOT DETECTED Final    Comment: RESULT CALLED TO, READ BACK BY AND VERIFIED WITH: NALLELY SOSA@2250  95/63/87 RH (NOTE) The GeneXpert MRSA Assay (FDA approved for NASAL specimens only), is one component of a comprehensive MRSA colonization surveillance program. It is not intended to diagnose MRSA infection nor to guide or monitor treatment for MRSA infections. Test performance is not FDA approved in patients less than 23 years old. Performed at  [facility], [address] [place], [place] [address]   KOH prep     Status: None   Collection Time: 06/21/21  1:30 PM   Specimen: Esophagus  Result Value Ref Range Status   Specimen Description ESOPHAGUS  Final   Special Requests NONE  Final   KOH Prep   Final    YEAST WITH PSEUDOHYPHAE Performed at [facility], [address] [place], [place] [address]    Report Status 06/21/2021 FINAL  Final         Radiology Studies: CT Angio Abd/Pel w/ and/or w/o  Result Date: 06/20/2021 CLINICAL DATA:  Anemia, epigastric abdominal pain EXAM: CTA ABDOMEN AND PELVIS WITHOUT AND WITH CONTRAST TECHNIQUE: Multidetector CT imaging of the abdomen and pelvis was performed using the standard protocol during bolus administration of intravenous contrast. Multiplanar reconstructed images and MIPs were obtained and reviewed to evaluate the vascular anatomy. CONTRAST:  135mL OMNIPAQUE IOHEXOL 350 MG/ML SOLN COMPARISON:  None. FINDINGS: VASCULAR Aorta: Normal caliber aorta without aneurysm, dissection, vasculitis or significant stenosis. Diffuse atherosclerosis. Celiac: Patent without evidence of aneurysm, dissection, vasculitis or significant stenosis. Mild atherosclerosis. SMA: Patent without evidence of aneurysm, dissection, vasculitis or significant stenosis. Moderate atherosclerosis at the origin of the SMA, with less than 50% stenosis. Renals: Both renal arteries are patent without evidence of aneurysm, dissection, vasculitis, fibromuscular dysplasia or significant stenosis. Moderate atherosclerosis. IMA: Patent without evidence of aneurysm, dissection, vasculitis or significant stenosis. Inflow: Patent without evidence of aneurysm, dissection, vasculitis or significant stenosis. Proximal Outflow: Bilateral common femoral and visualized portions of the superficial and profunda femoral arteries are patent without evidence of aneurysm, dissection, vasculitis or significant stenosis. Veins:  IVC filter. Decreased caliber of the bilateral common iliac veins may reflect chronic veno-occlusive disease. Numerous venous collaterals are seen within the abdominal wall and retroperitoneum. Review of the MIP images confirms the above findings. NON-VASCULAR Lower chest: No acute pleural or parenchymal lung disease. Hepatobiliary: No focal  liver abnormality is seen. No gallstones, gallbladder wall thickening, or biliary dilatation. Pancreas: Unremarkable. No pancreatic ductal dilatation or surrounding inflammatory changes. Spleen: Centripetal enhancement of a hypodensity in the superior aspect of the spleen compatible with hemangioma. No evidence of splenomegaly. Adrenals/Urinary Tract: Adrenal glands are unremarkable. Kidneys are normal, without renal calculi, focal lesion, or hydronephrosis. Bladder is decompressed, limiting its evaluation. Stomach/Bowel: Dense stool throughout the colon limits evaluation for active gastrointestinal bleeding. I do not see any intraluminal accumulation of contrast within the bowel to suggest active hemorrhage. No bowel obstruction or ileus. Mild wall thickening of the distal rectum with perirectal fat stranding may reflect proctitis. Scattered diverticulosis within the colon without evidence of diverticulitis. Lymphatic: Borderline enlarged retroperitoneal lymph nodes are identified, measuring up to 10 mm in the left para-aortic region. No other pathologic adenopathy. Reproductive: Uterus and bilateral adnexa are unremarkable. Other: No free fluid or free intraperitoneal gas. Mild subcutaneous edema throughout the lower anterior abdominal wall and proximal lower extremities. Musculoskeletal: Severe bilateral hip osteoarthritis. No acute or destructive bony lesions. Reconstructed images demonstrate no additional findings. IMPRESSION: VASCULAR 1. No evidence of active gastrointestinal bleeding. Limited evaluation of the colon due to dense stool. 2.  Aortic Atherosclerosis  (ICD10-I70.0). NON-VASCULAR 1. Mild distal rectal wall thickening and perirectal fat stranding which could reflect proctitis. 2. Scattered colonic diverticulosis without diverticulitis. 3. Borderline enlarged retroperitoneal lymph nodes, measuring up to 10 mm, nonspecific. 4. Decreased caliber of the bilateral iliac veins and distal IVC below the IVC filter, consistent with chronic occlusion. 5.  Aortic Atherosclerosis (ICD10-I70.0). Electronically Signed   By: Randa Ngo M.D.   On: 06/20/2021 18:08        Scheduled Meds:  sodium chloride   Intravenous Once   Chlorhexidine Gluconate Cloth  6 each Topical Q0600   feeding supplement  1 Container Oral TID BM   insulin aspart  0-5 Units Subcutaneous QHS   insulin aspart  0-9 Units Subcutaneous TID WC   mometasone-formoterol  2 puff Inhalation BID   multivitamin with minerals  1 tablet Oral Daily   pantoprazole (PROTONIX) IV  40 mg Intravenous Q12H   rosuvastatin  10 mg Oral Daily   Continuous Infusions:  sodium chloride Stopped (06/20/21 1229)   sodium chloride     vancomycin 1,250 mg (06/22/21 1206)     LOS: 2 days    Time spent: 25 minutes    Sidney Ace, MD [facility]   If 7PM-7AM, please contact night-coverage  06/22/2021, 2:51 PM

## 2021-06-23 LAB — BASIC METABOLIC PANEL
Anion gap: 5 (ref 5–15)
BUN: 12 mg/dL (ref 8–23)
CO2: 24 mmol/L (ref 22–32)
Calcium: 8 mg/dL — ABNORMAL LOW (ref 8.9–10.3)
Chloride: 108 mmol/L (ref 98–111)
Creatinine, Ser: 0.63 mg/dL (ref 0.44–1.00)
GFR, Estimated: >60 mL/min (ref >60)
Glucose, Bld: 100 mg/dL — ABNORMAL HIGH (ref 70–99)
Potassium: 3.8 mmol/L (ref 3.5–5.1)
Sodium: 137 mmol/L (ref 135–145)

## 2021-06-23 LAB — GLUCOSE, CAPILLARY
Glucose-Capillary: 95 mg/dL (ref 70–99)
Glucose-Capillary: 151 mg/dL — ABNORMAL HIGH (ref 70–99)
Glucose-Capillary: 132 mg/dL — ABNORMAL HIGH (ref 70–99)
Glucose-Capillary: 140 mg/dL — ABNORMAL HIGH (ref 70–99)

## 2021-06-23 LAB — CBC WITH DIFFERENTIAL/PLATELET
Abs Immature Granulocytes: 0.03 10*3/uL (ref 0.00–0.07)
Basophils Absolute: 0 10*3/uL (ref 0.0–0.1)
Basophils Relative: 0 %
Eosinophils Absolute: 0.4 10*3/uL (ref 0.0–0.5)
Eosinophils Relative: 6 %
HCT: 25.9 % — ABNORMAL LOW (ref 36.0–46.0)
Hemoglobin: 8.1 g/dL — ABNORMAL LOW (ref 12.0–15.0)
Immature Granulocytes: 1 %
Lymphocytes Relative: 9 %
Lymphs Abs: 0.5 10*3/uL — ABNORMAL LOW (ref 0.7–4.0)
MCH: 24 pg — ABNORMAL LOW (ref 26.0–34.0)
MCHC: 31.3 g/dL (ref 30.0–36.0)
MCV: 76.9 fL — ABNORMAL LOW (ref 80.0–100.0)
Monocytes Absolute: 0.5 10*3/uL (ref 0.1–1.0)
Monocytes Relative: 9 %
Neutro Abs: 4.2 10*3/uL (ref 1.7–7.7)
Neutrophils Relative %: 75 %
Platelets: 177 10*3/uL (ref 150–400)
RBC: 3.37 MIL/uL — ABNORMAL LOW (ref 3.87–5.11)
RDW: 22.5 % — ABNORMAL HIGH (ref 11.5–15.5)
Smear Review: NORMAL
WBC: 5.6 10*3/uL (ref 4.0–10.5)
nRBC: 0 % (ref 0.0–0.2)

## 2021-06-23 LAB — VANCOMYCIN, PEAK: Vancomycin Pk: 25 ug/mL — ABNORMAL LOW (ref 30–40)

## 2021-06-23 NOTE — TOC Initial Note (Signed)
Transition of Care [facility]) - Initial/Assessment Note    Patient Details  Name: Courtney Mack MRN: [id number] Date of Birth: [birth year]  Transition of [facility]) CM/SW Contact:    Candie Chroman, LCSW Phone Number: 06/23/2021, 12:22 PM  Clinical Narrative: Readmission prevention screen complete. CSW met with patient. No supports at bedside. CSW introduced role and explained that discharge planning would be discussed. PCP is a female provider at [facility] but patient cannot remember her name. Daughter drives her to appointments due to a cataract she has. Pharmacy is [facility] on [address] beside [address]. No issues obtaining medications. No home health prior to admission. Patient has a RW, bedside commode, and shower chair at home. PT/OT evals pending. Will follow up if they make any recommendations. No further concerns. CSW encouraged patient to contact CSW as needed. CSW will continue to follow patient for support and facilitate discharge once medically stable.                 Expected Discharge Plan: Home/Self Care Barriers to Discharge: Continued Medical Work up   Patient Goals and CMS Choice        Expected Discharge Plan and Services Expected Discharge Plan: Home/Self Care       Living arrangements for the past 2 months: Single Family Home                                      Prior Living Arrangements/Services Living arrangements for the past 2 months: Single Family Home   Patient language and need for interpreter reviewed:: Yes Do you feel safe going back to the place where you live?: Yes      Need for Family Participation in Patient Care: Yes (Comment) Care giver support system in place?: Yes (comment) Current home services: DME Criminal Activity/Legal Involvement Pertinent to Current Situation/Hospitalization: No - Comment as needed  Activities of Daily Living Home Assistive Devices/Equipment: None ADL Screening (condition at time of  admission) Patient's cognitive ability adequate to safely complete daily activities?: Yes Is the patient deaf or have difficulty hearing?: No Does the patient have difficulty seeing, even when wearing glasses/contacts?: No Does the patient have difficulty concentrating, remembering, or making decisions?: No Patient able to express need for assistance with ADLs?: Yes Does the patient have difficulty dressing or bathing?: No Independently performs ADLs?: No Does the patient have difficulty walking or climbing stairs?: No Weakness of Legs: None Weakness of Arms/Hands: None  Permission Sought/Granted                  Emotional Assessment Appearance:: Appears stated age Attitude/Demeanor/Rapport: Engaged, Gracious Affect (typically observed): Accepting, Appropriate, Calm, Pleasant Orientation: : Oriented to Self, Oriented to Place, Oriented to  Time, Oriented to Situation Alcohol / Substance Use: Not Applicable Psych Involvement: No (comment)  Admission diagnosis:  GI bleeding [K92.2] Sepsis ([place]) [A41.9] Symptomatic anemia [D64.9] Gastrointestinal hemorrhage, unspecified gastrointestinal hemorrhage type [K92.2] Patient Active Problem List   Diagnosis Date Noted   Pressure injury of skin 06/21/2021   Symptomatic anemia 06/20/2021   Sepsis ([place]) 06/20/2021   HLD (hyperlipidemia) 06/20/2021   Fall at home, initial encounter 06/20/2021   Cough 06/20/2021   Chronic diastolic CHF (congestive heart failure) ([place]) 06/20/2021   GI bleeding 06/20/2021   Cellulitis of left lower leg 06/20/2021   Acute bilateral deep vein thrombosis (DVT) of femoral veins ([place]) 01/02/2021  DOE (dyspnea on exertion) 11/10/2020   Risk factors for obstructive sleep apnea 11/10/2020   Uterine cancer ([place]) 11/08/2020   IDA (iron deficiency anemia) 09/21/2020   Endometrial cancer ([place]) 09/13/2020   Acute blood loss anemia 09/06/2020   Vaginal bleeding 09/06/2020   DVT (deep venous thrombosis) ([place])  09/06/2020   Acute massive pulmonary embolism ([place]) 08/25/2020   Anemia, unspecified 08/24/2020   Morbidly obese ([place]) 08/23/2020   Essential hypertension 08/23/2020   Diabetes mellitus without complication ([place]) 48/47/2072   Dyslipidemia 08/23/2020   Acute respiratory failure with hypoxia ([place])    Pulmonary embolism ([place]) 08/18/2020   Vaginal bleeding, abnormal 08/18/2020   Obesity, morbid, BMI 50 or higher ([place]) 08/18/2020   PCP:  [facility], [place]:   [id number] [address], [address] [place] [address] Phone: [phone] Fax: [phone]     Social Determinants of Health ([place]) Interventions    Readmission Risk Interventions Readmission Risk Prevention Plan 06/23/2021  Transportation Screening Complete  PCP or Specialist Appt within 3-5 Days Complete  Social Work Consult for [place] Planning/Counseling Complete  Palliative Care Screening Not Applicable  Medication Review Press photographer) Complete  Some recent data might be hidden

## 2021-06-23 NOTE — Care Plan (Signed)
Capsule endoscopy reviewed. She did have ulceration noted in small bowel that was not actively bleeding. More distal to this there was some dark stool/material. No other fresh blood/acute bleeding was appreciated. The capsule did enter the colon.  Can continue oral ppi and restart anticoagulation at primary team's discretion. Hgb stable today.  Pt resting comfortably in bed when results were shared with her and she noted brown stool. No abdominal pain/n/v/d/c.  CV: s1 s2 rrr no m/r/c/g Pulm: CTAB; no inc wob, no w/r/r/c GI: soft ntnd +bs; no guarding/rigidity  GI to sign off. Available as needed. Please do not hesitate to call regarding questions or concerns.  Annamaria Helling, DO [facility]

## 2021-06-23 NOTE — Evaluation (Signed)
Physical Therapy Evaluation Patient Details Name: Courtney Mack MRN: [id number] DOB: Sep 29, 1955 Today's Date: 06/23/2021  History of Present Illness  Courtney Mack is a 65 y.o. female with medical history significant of DVT/PE on Xarelto, hypertension, hyperlipidemia, diabetes mellitus, anemia, obesity, endometrial cancer, dCHF, morbid obesity, who presents with weakness, cough, fever and left lower  leg pain.  Clinical Impression  Patient tolerated session well and was agreeable to treatment. Patient reported she was not willing to walk out in the hallway, however would do therapy within the room. Throughout PT evaluation, patient demonstrated impaired ADL performance and functional mobility. Patient reported 9/10 pain throughout the session, due to increased compression around the ankles from her socks. Once socks removed, patient reported pain went down slightly. BLEs demonstrated increased edema, and adipose tissue, limited LE active and passive range of motion, as well as  strength assessment. Grossly BLE strength is 3/5. Patient reported increased fatigue and increased SOB after ambulating 25 feet. Patient demonstrated supine to sit at SBA, however required Mod A for sitting to supine. Patient was able to complete sit to stands CGA from an elevated surface due to BLE weakness. Patient would continue to benefit from skilled physical therapy in order to optimize return to PLOF. Recommend HHPT following acute hospitalization.      Recommendations for follow up therapy are one component of a multi-disciplinary discharge planning process, led by the attending physician.  Recommendations may be updated based on patient status, additional functional criteria and insurance authorization.  Follow Up Recommendations Home health PT    Assistance Recommended at Discharge Intermittent Supervision/Assistance  Functional Status Assessment Patient has had a recent decline in their functional status  and demonstrates the ability to make significant improvements in function in a reasonable and predictable amount of time.  Equipment Recommendations  None recommended by PT    Recommendations for Other Services       Precautions / Restrictions Precautions Precautions: Fall Restrictions Weight Bearing Restrictions: No      Mobility  Bed Mobility Overal bed mobility: Needs Assistance Bed Mobility: Supine to Sit;Sit to Supine     Supine to sit: Supervision (increased time and effort; utilized BUE support from bed rails to come to sitting; completed with HOB railed) Sit to supine: Mod assist (assist at trunk and BLE to assist patient back into bed)   General bed mobility comments: assist for trunk exiting BLE return to bed    Transfers Overall transfer level: Needs assistance Equipment used: Rolling walker (2 wheels) Transfers: Sit to/from Stand Sit to Stand: Min guard;From elevated surface                Ambulation/Gait Ambulation/Gait assistance: Min guard Gait Distance (Feet): 25 Feet Assistive device: Rolling walker (2 wheels) Gait Pattern/deviations: Step-through pattern;Decreased step length - right;Decreased step length - left;Narrow base of support;Trunk flexed Gait velocity: decreased        Stairs            Wheelchair Mobility    Modified Rankin (Stroke Patients Only)       Balance Overall balance assessment: Needs assistance Sitting-balance support: No upper extremity supported;Feet supported Sitting balance-Leahy Scale: Good Sitting balance - Comments: able to reach outside BOS with no LOB   Standing balance support: Bilateral upper extremity supported;During functional activity Standing balance-Leahy Scale: Fair  Pertinent Vitals/Pain Pain Assessment: 0-10 Pain Score: 9  Pain Location: L lower medial leg Pain Descriptors / Indicators: Sharp;Discomfort Pain Intervention(s): Limited  activity within patient's tolerance;Monitored during session;Repositioned    Home Living Family/patient expects to be discharged to:: Private residence Living Arrangements: Children Available Help at Discharge: Family;Available 24 hours/day Type of Home: House Home Access: Ramped entrance       Home Layout: One level Home Equipment: Conservation officer, nature (2 wheels);BSC/3in1      Prior Function Prior Level of Function : Independent/Modified Independent             Mobility Comments: MOD I mobility using RW, PRN assist from daughter for dressing       Hand Dominance   Dominant Hand: Right    Extremity/Trunk Assessment   Upper Extremity Assessment Upper Extremity Assessment: Overall WFL for tasks assessed    Lower Extremity Assessment Lower Extremity Assessment: Generalized weakness (grossly 3/5 strength in BLE)       Communication   Communication: No difficulties  Cognition Arousal/Alertness: Awake/alert Behavior During Therapy: WFL for tasks assessed/performed Overall Cognitive Status: Within Functional Limits for tasks assessed                                          General Comments      Exercises Other Exercises Other Exercises: x3 sit to stands from elevated surface Other Exercises: educated on fall risk and fall prevention   Assessment/Plan    PT Assessment Patient needs continued PT services  PT Problem List Decreased strength;Decreased balance;Decreased mobility;Decreased activity tolerance;Pain       PT Treatment Interventions DME instruction;Functional mobility training;Therapeutic exercise;Therapeutic activities;Gait training;Stair training;Balance training;Patient/family education    PT Goals (Current goals can be found in the Care Plan section)  Acute Rehab PT Goals Patient Stated Goal: to go home PT Goal Formulation: With patient Time For Goal Achievement: 07/07/21 Potential to Achieve Goals: Good    Frequency Min  2X/week   Barriers to discharge        Co-evaluation               AM-PAC PT "6 Clicks" Mobility  Outcome Measure Help needed turning from your back to your side while in a flat bed without using bedrails?: A Lot Help needed moving from lying on your back to sitting on the side of a flat bed without using bedrails?: A Lot Help needed moving to and from a bed to a chair (including a wheelchair)?: A Little Help needed standing up from a chair using your arms (e.g., wheelchair or bedside chair)?: A Little Help needed to walk in hospital room?: A Little Help needed climbing 3-5 steps with a railing? : A Little 6 Click Score: 16    End of Session Equipment Utilized During Treatment: Gait belt Activity Tolerance: Patient tolerated treatment well Patient left: in bed;with call bell/phone within reach;with bed alarm set Nurse Communication: Mobility status PT Visit Diagnosis: Other abnormalities of gait and mobility (R26.89);Unsteadiness on feet (R26.81);Muscle weakness (generalized) (M62.81)    Time: 3810-1751 PT Time Calculation (min) (ACUTE ONLY): 31 min   Charges:   PT Evaluation $PT Eval Low Complexity: 1 Low PT Treatments $Gait Training: 8-22 mins $Therapeutic Activity: 23-37 mins       Iva Boop, PT  06/23/21. 2:52 PM

## 2021-06-23 NOTE — Progress Notes (Signed)
PROGRESS NOTE    Courtney Mack  RUE:[id number] DOB: 1955-09-09 DOA: 06/20/2021 PCP: [facility]    Brief Narrative:  65 y.o. female with medical history significant of DVT/PE on Xarelto, hypertension, hyperlipidemia, diabetes mellitus, anemia, obesity, endometrial cancer, dCHF, morbid obesity, who presents with weakness, cough, fever and left lower  leg pain.   Patient states that she has intermittent dark stool which has been going on for several weeks.  She developed generalized weakness. She states that she felt lightheadedness and fell on the floor this morning.  No loss of consciousness. No head or neck injury. She also reports left lower leg pain. Her left lower leg is mild erythematous.  Patient has fever and chills.  Patient reports cough and mild shortness breath.  Denies chest pain.  No nausea, vomiting, diarrhea or abdominal pain.  Denies symptoms of UTI.  Patient moves all extremities.  Schedule for EGD and colonoscopy on 12/15 with GI.  Underwent successfully.  Nonbleeding ulcer identified.  Unclear source of blood loss.  Hemoglobin is remained stable post procedure.  He had capsule endoscopy completed and is pending read.  Per GI okay to slowly advance diet.   Assessment & Plan:   Principal Problem:   GI bleeding Active Problems:   Pulmonary embolism (HCC)   Diabetes mellitus without complication (HCC)   Acute blood loss anemia   DVT (deep venous thrombosis) (HCC)   Symptomatic anemia   Sepsis (HCC)   HLD (hyperlipidemia)   Fall at home, initial encounter   Cough   Chronic diastolic CHF (congestive heart failure) (HCC)   Cellulitis of left lower leg   Pressure injury of skin  GI bleeding, acute blood loss anemia and symptomatic anemia Hgb dropped from 10.7 --> 4.0 --> 3.6.  consulted Dr. Alice Reichert of GI Also consulted Dr. Lucky Cowboy of [facility].  Initially  NM blood loss scan is ordered, but since pt dose not have active bleeding, Dr. Lucky Cowboy d/c'ed NM  blood loss scan test Status post 1 dose Kcentra to reverse Xarelto effect Status post EGD colonoscopy, no bleed noted Status post video endoscopy, read pending Patient stable Has received 4 units PRBC total Plan: Continue twice daily PPI Hemoglobin stable, defer transfusion for now Maintain hemoglobin greater than 8 Hold NSAIDs and Xarelto Follow-up with GI for video endoscopy read Cautiously advance to soft diet  Esophageal candidiasis Noted on endoscopy Fluconazole per GI recommendations   Hx of Pulmonary embolism and DVT pt is on Xarelto Pt is s/p of IVC filter on 08/18/20 Consulted Dr. Lucky Cowboy of [facility]. -f/u Holding Xarelto in setting of bleed -Appreciate vascular surgery recommendations Plan: -We will need to decide regarding ongoing anticoagulation.  Will await video endoscopy.  Continue holding Xarelto  Diabetes mellitus without complication (HCC) Recent A1c 6.2, well controlled.   Patient taking metformin at home -Sliding scale insulin   Sepsis due to Cellulitis of left lower leg  Patient feels great.   For sepsis with WBC 14.2, tachycardia with heart rate 105, RR 33, patient also has a fever 100.2. MRSA screen positive Plan: Continue vancomycin pharmacy dosing assistance Follow blood cultures, NGTD Monitor vitals and fever curve Transition oral antibiotic with MRSA coverage if patient ready for discharge   HLD (hyperlipidemia) -Crestor   Fall at home, initial encounter CT head negative. -Therapy evaluations ordered   Cough Chest x-ray negative. -Supportive care, as needed Mucinex and albuterol   HTN Blood pressure low normal Hold home ACE inhibitor -IV hydralazine as needed  Chronic diastolic CHF (congestive heart failure) (HCC)  2D echo on 08/19/2020 showed EF 60 to 65% with grade 1 diastolic dysfunction.  Chest x-ray is negative for pulmonary edema, does not seem to have CHF exacerbation. Scherrie Merritts give as needed Lasix while patient is getting blood  transfusion -check BNP   DVT prophylaxis: None.  Chemoprophylaxis contraindicated.  SCD contraindicated given history of DVT Code Status: Full Family Communication:  Disposition Plan: Status is: Inpatient  Remains inpatient appropriate because: Severe acute on chronic blood loss anemia.  Status post video capsule endoscopy.  Pending read       Level of care: Telemetry Medical  Consultants:  GI Vascular surgery  Procedures:  EGD 12/15 Colonoscopy 12/15  Antimicrobials: None   Subjective: Patient seen and examined.  Resting comfortably in bed.  Stable.  No apparent distress.  Hemoglobin stable  Objective: Vitals:   06/22/21 1938 06/22/21 2234 06/23/21 0430 06/23/21 0734  BP: 115/62  112/60 (!) 107/51  Pulse: 80  80 87  Resp: 20  18 18   Temp: 98 F (36.7 C)  98 F (36.7 C) 98.5 F (36.9 C)  TempSrc: Oral   Oral  SpO2: 100% 93% 93% 91%  Weight:      Height:        Intake/Output Summary (Last 24 hours) at 06/23/2021 1130 Last data filed at 06/23/2021 1018 Gross per 24 hour  Intake 480 ml  Output 500 ml  Net -20 ml   Filed Weights   06/21/21 0500 06/21/21 1257 06/22/21 0403  Weight: 110.2 kg 110.2 kg 105.1 kg    Examination:  General exam: No apparent distress.  Resting comfortably in bed Respiratory system: Clear lungs.  Normal work of breathing.  Room air Cardiovascular system: S1-S2, RRR, no murmurs Gastrointestinal system: Soft, NT/ND, normal bowel sounds Central nervous system: Alert and oriented. No focal neurological deficits. Extremities: Mild bilateral lower leg edema.  LLE with mild erythema.  Tenderness to touch Skin: No rashes, lesions or ulcers Psychiatry: Judgement and insight appear normal. Mood & affect appropriate.     Data Reviewed: I have personally reviewed following labs and imaging studies  CBC: Recent Labs  Lab 06/20/21 0811 06/20/21 1049 06/21/21 1009 06/21/21 1542 06/21/21 2207 06/22/21 0457 06/23/21 0551  WBC  14.2*   < > 12.2* 12.6* 10.3 8.5 5.6  NEUTROABS 12.9*  --   --   --   --   --  4.2  HGB 4.0*   < > 8.1* 8.6* 7.6* 7.9* 8.1*  HCT 14.9*   < > 25.3* 27.7* 24.1* 25.0* 25.9*  MCV 66.8*   < > 73.8* 74.9* 75.1* 75.1* 76.9*  PLT 282   < > 169 168 165 166 177   < > = values in this interval not displayed.   Basic Metabolic Panel: Recent Labs  Lab 06/20/21 0811 06/21/21 0230 06/22/21 0457 06/23/21 0551  NA 136  --  135 137  K 3.6  --  3.3* 3.8  CL 107  --  106 108  CO2 23  --  24 24  GLUCOSE 122*  --  93 100*  BUN 14  --  17 12  CREATININE 0.71 0.85 0.83 0.63  CALCIUM 8.6*  --  8.1* 8.0*   GFR: Estimated Creatinine Clearance: 74.3 mL/min (by C-G formula based on SCr of 0.63 mg/dL). Liver Function Tests: Recent Labs  Lab 06/20/21 0811  AST 14*  ALT 8  ALKPHOS 49  BILITOT 1.8*  PROT 6.6  ALBUMIN 2.8*  No results for input(s): LIPASE, AMYLASE in the last 168 hours. No results for input(s): AMMONIA in the last 168 hours. Coagulation Profile: Recent Labs  Lab 06/20/21 0811  INR 1.7*   Cardiac Enzymes: No results for input(s): CKTOTAL, CKMB, CKMBINDEX, TROPONINI in the last 168 hours. BNP (last 3 results) No results for input(s): PROBNP in the last 8760 hours. HbA1C: No results for input(s): HGBA1C in the last 72 hours. CBG: Recent Labs  Lab 06/22/21 0718 06/22/21 1146 06/22/21 1638 06/22/21 2114 06/23/21 0735  GLUCAP 98 95 119* 113* 95   Lipid Profile: No results for input(s): CHOL, HDL, LDLCALC, TRIG, CHOLHDL, LDLDIRECT in the last 72 hours. Thyroid Function Tests: No results for input(s): TSH, T4TOTAL, FREET4, T3FREE, THYROIDAB in the last 72 hours. Anemia Panel: No results for input(s): VITAMINB12, FOLATE, FERRITIN, TIBC, IRON, RETICCTPCT in the last 72 hours.  Sepsis Labs: Recent Labs  Lab 06/20/21 0811 06/20/21 1049  PROCALCITON  --  1.92  LATICACIDVEN 1.4 1.2    Recent Results (from the past 240 hour(s))  Culture, blood (Routine x 2)     Status:  None (Preliminary result)   Collection Time: 06/20/21  8:11 AM   Specimen: BLOOD  Result Value Ref Range Status   Specimen Description BLOOD RIGHT HAND  Final   Special Requests   Final    BOTTLES DRAWN AEROBIC AND ANAEROBIC Blood Culture adequate volume   Culture   Final    NO GROWTH 3 DAYS Performed at [facility], [address] [place], [place] [address]    Report Status PENDING  Incomplete  Resp Panel by RT-PCR (Flu A&B, Covid) Nasopharyngeal Swab     Status: None   Collection Time: 06/20/21  8:14 AM   Specimen: Nasopharyngeal Swab; Nasopharyngeal(NP) swabs in vial transport medium  Result Value Ref Range Status   SARS Coronavirus 2 by RT PCR NEGATIVE NEGATIVE Final    Comment: (NOTE) SARS-CoV-2 target nucleic acids are NOT DETECTED.  The SARS-CoV-2 RNA is generally detectable in upper respiratory specimens during the acute phase of infection. The lowest concentration of SARS-CoV-2 viral copies this assay can detect is 138 copies/mL. A negative result does not preclude SARS-Cov-2 infection and should not be used as the sole basis for treatment or other patient management decisions. A negative result may occur with  improper specimen collection/handling, submission of specimen other than nasopharyngeal swab, presence of viral mutation(s) within the areas targeted by this assay, and inadequate number of viral copies(<138 copies/mL). A negative result must be combined with clinical observations, patient history, and epidemiological information. The expected result is Negative.  Fact Sheet for Patients:  EntrepreneurPulse.com.au  Fact Sheet for Healthcare Providers:  IncredibleEmployment.be  This test is no t yet approved or cleared by the [place] FDA and  has been authorized for detection and/or diagnosis of SARS-CoV-2 by FDA under an Emergency Use Authorization (EUA). This EUA will remain  in effect (meaning this test  can be used) for the duration of the COVID-19 declaration under Section 564(b)(1) of the Act, 21 U.S.C.section 360bbb-3(b)(1), unless the authorization is terminated  or revoked sooner.       Influenza A by PCR NEGATIVE NEGATIVE Final   Influenza B by PCR NEGATIVE NEGATIVE Final    Comment: (NOTE) The Xpert Xpress SARS-CoV-2/FLU/RSV plus assay is intended as an aid in the diagnosis of influenza from Nasopharyngeal swab specimens and should not be used as a sole basis for treatment. Nasal washings and aspirates are unacceptable for  Xpert Xpress SARS-CoV-2/FLU/RSV testing.  Fact Sheet for Patients: EntrepreneurPulse.com.au  Fact Sheet for Healthcare Providers: IncredibleEmployment.be  This test is not yet approved or cleared by the [place] FDA and has been authorized for detection and/or diagnosis of SARS-CoV-2 by FDA under an Emergency Use Authorization (EUA). This EUA will remain in effect (meaning this test can be used) for the duration of the COVID-19 declaration under Section 564(b)(1) of the Act, 21 U.S.C. section 360bbb-3(b)(1), unless the authorization is terminated or revoked.  Performed at [facility], [address] [place], [place] [address]   Culture, blood (Routine x 2)     Status: None (Preliminary result)   Collection Time: 06/20/21  8:16 AM   Specimen: BLOOD  Result Value Ref Range Status   Specimen Description BLOOD LEFT [facility]  Final   Special Requests   Final    BOTTLES DRAWN AEROBIC AND ANAEROBIC Blood Culture adequate volume   Culture   Final    NO GROWTH 3 DAYS Performed at [facility], [address], [place], [place] [address]    Report Status PENDING  Incomplete  MRSA Next Gen by PCR, Nasal     Status: Abnormal   Collection Time: 06/20/21  9:08 PM   Specimen: Nasal Mucosa; Nasal Swab  Result Value Ref Range Status   MRSA by PCR Next Gen DETECTED (A) NOT DETECTED Final    Comment:  RESULT CALLED TO, READ BACK BY AND VERIFIED WITH: NALLELY SOSA@2250  06/20/21 RH (NOTE) The GeneXpert MRSA Assay (FDA approved for NASAL specimens only), is one component of a comprehensive MRSA colonization surveillance program. It is not intended to diagnose MRSA infection nor to guide or monitor treatment for MRSA infections. Test performance is not FDA approved in patients less than 78 years old. Performed at [facility], [address] [place], [place] [address]   KOH prep     Status: None   Collection Time: 06/21/21  1:30 PM   Specimen: Esophagus  Result Value Ref Range Status   Specimen Description ESOPHAGUS  Final   Special Requests NONE  Final   KOH Prep   Final    YEAST WITH PSEUDOHYPHAE Performed at [facility], [address] [place], [place] [address]    Report Status 06/21/2021 FINAL  Final         Radiology Studies: No results found.      Scheduled Meds:  sodium chloride   Intravenous Once   vitamin C  500 mg Oral BID   Chlorhexidine Gluconate Cloth  6 each Topical Q0600   feeding supplement  237 mL Oral TID BM   fluconazole  200 mg Oral Daily   insulin aspart  0-5 Units Subcutaneous QHS   insulin aspart  0-9 Units Subcutaneous TID WC   mometasone-formoterol  2 puff Inhalation BID   multivitamin with minerals  1 tablet Oral Daily   pantoprazole  40 mg Oral Daily   rosuvastatin  10 mg Oral Daily   Continuous Infusions:  sodium chloride Stopped (06/20/21 1229)   sodium chloride     vancomycin Stopped (06/22/21 1336)     LOS: 3 days    Time spent: 25 minutes    Sidney Ace, MD [facility]   If 7PM-7AM, please contact night-coverage  06/23/2021, 11:30 AM

## 2021-06-23 NOTE — Evaluation (Signed)
Occupational Therapy Evaluation Patient Details Name: Courtney Mack MRN: [id number] DOB: [birth year] Today's Date: 06/23/2021   History of Present Illness Courtney Mack is a 65 y.o. female with medical history significant of DVT/PE on Xarelto, hypertension, hyperlipidemia, diabetes mellitus, anemia, obesity, endometrial cancer, dCHF, morbid obesity, who presents with weakness, cough, fever and left lower  leg pain.   Clinical Impression   Ms July was seen for OT evaluation this date. Prior to hospital admission, pt was MOD I for mobility and ALDs - PRN assist for dressing. Pt lives in 1 level home c ramped entrance and daughters available 24/7. Pt presents to acute OT demonstrating impaired ADL performance and functional mobility 2/2 decreased activity tolerance, increased edema, and functional strength/ROM deficits. Pt currently requires MOD A + RW don briefs - assist for threading over B feet and pulling up over rear in standing. SBA + RW for ADL t/f, standing grooming, and ~40 ft mobility. Pt would benefit from skilled OT to address noted impairments and functional limitations (see below for any additional details) in order to maximize safety and independence while minimizing falls risk and caregiver burden. Upon hospital discharge, recommend HHOT to maximize pt safety and return to PLOF.       Recommendations for follow up therapy are one component of a multi-disciplinary discharge planning process, led by the attending physician.  Recommendations may be updated based on patient status, additional functional criteria and insurance authorization.   Follow Up Recommendations  Home health OT    Assistance Recommended at Discharge Frequent or constant Supervision/Assistance  Functional Status Assessment  Patient has had a recent decline in their functional status and demonstrates the ability to make significant improvements in function in a reasonable and predictable amount of time.   Equipment Recommendations  None recommended by OT    Recommendations for Other Services       Precautions / Restrictions Precautions Precautions: Fall Restrictions Weight Bearing Restrictions: No      Mobility Bed Mobility Overal bed mobility: Needs Assistance Bed Mobility: Supine to Sit;Sit to Supine     Supine to sit: Min assist Sit to supine: Mod assist   General bed mobility comments: assist for trunk exiting BLE return to bed    Transfers Overall transfer level: Needs assistance Equipment used: Rolling walker (2 wheels) Transfers: Sit to/from Stand Sit to Stand: Min guard;From elevated surface                  Balance Overall balance assessment: Needs assistance Sitting-balance support: No upper extremity supported;Feet supported Sitting balance-Leahy Scale: Good     Standing balance support: No upper extremity supported;During functional activity Standing balance-Leahy Scale: Fair                             ADL either performed or assessed with clinical judgement   ADL Overall ADL's : Needs assistance/impaired                                       General ADL Comments: MOD A + RW don briefs - assist for threading over B feet and pulling up over rear in standing. SBA + RW for ADL t/f, standing grooming, and ~40 ft mobility.      Pertinent Vitals/Pain Pain Assessment: No/denies pain     Hand Dominance Right   Extremity/Trunk Assessment Upper  Extremity Assessment Upper Extremity Assessment: Overall WFL for tasks assessed   Lower Extremity Assessment Lower Extremity Assessment: Generalized weakness       Communication Communication Communication: No difficulties   Cognition Arousal/Alertness: Awake/alert Behavior During Therapy: WFL for tasks assessed/performed Overall Cognitive Status: Within Functional Limits for tasks assessed                                       General Comments        Exercises Exercises: Other exercises Other Exercises Other Exercises: Pt educated re: OT role, DME recs, d/c recs, falls prevention, ECS, edema mgmt strategies Other Exercises: LBD, sup<>sit, sit<>stand, sitting/standing balance/toleance, ~40 ft mobility, grooming   Shoulder Instructions      Home Living Family/patient expects to be discharged to:: Private residence Living Arrangements: Children Available Help at Discharge: Family;Available 24 hours/day Type of Home: House Home Access: Ramped entrance     Home Layout: One level     Bathroom Shower/Tub: Occupational psychologist: Handicapped height     Home Equipment: Conservation officer, nature (2 wheels);BSC/3in1 (lift chair)          Prior Functioning/Environment Prior Level of Function : Independent/Modified Independent             Mobility Comments: MOD I mobility using RW, PRN assist from daughter for dressing          OT Problem List: Decreased strength;Decreased range of motion;Decreased activity tolerance;Impaired balance (sitting and/or standing);Increased edema      OT Treatment/Interventions: Self-care/ADL training;Therapeutic exercise;DME and/or AE instruction;Energy conservation;Therapeutic activities;Patient/family education;Balance training    OT Goals(Current goals can be found in the care plan section) Acute Rehab OT Goals Patient Stated Goal: to go home OT Goal Formulation: With patient Time For Goal Achievement: 07/07/21 Potential to Achieve Goals: Good ADL Goals Pt Will Perform Grooming: with modified independence;standing (c LRAD PRN) Pt Will Perform Lower Body Dressing: with supervision;with set-up;sit to/from stand Pt Will Perform Toileting - Clothing Manipulation and hygiene: with modified independence;sitting/lateral leans  OT Frequency: Min 2X/week   Barriers to D/C:            Co-evaluation              AM-PAC OT "6 Clicks" Daily Activity     Outcome Measure Help from  another person eating meals?: None Help from another person taking care of personal grooming?: A Little Help from another person toileting, which includes using toliet, bedpan, or urinal?: A Little Help from another person bathing (including washing, rinsing, drying)?: A Lot Help from another person to put on and taking off regular upper body clothing?: None Help from another person to put on and taking off regular lower body clothing?: A Lot 6 Click Score: 18   End of Session Equipment Utilized During Treatment: Rolling walker (2 wheels);Gait belt Nurse Communication: Mobility status  Activity Tolerance: Patient tolerated treatment well Patient left: in bed;with call bell/phone within reach;with bed alarm set  OT Visit Diagnosis: Other abnormalities of gait and mobility (R26.89)                Time: 4854-6270 OT Time Calculation (min): 24 min Charges:  OT General Charges $OT Visit: 1 Visit OT Evaluation $OT Eval Low Complexity: 1 Low OT Treatments $Self Care/Home Management : 8-22 mins  Dessie Coma, M.S. OTR/L  06/23/21, 1:35 PM  ascom [phone]

## 2021-06-24 LAB — GLUCOSE, CAPILLARY
Glucose-Capillary: 99 mg/dL (ref 70–99)
Glucose-Capillary: 128 mg/dL — ABNORMAL HIGH (ref 70–99)
Glucose-Capillary: 133 mg/dL — ABNORMAL HIGH (ref 70–99)
Glucose-Capillary: 136 mg/dL — ABNORMAL HIGH (ref 70–99)

## 2021-06-24 LAB — VANCOMYCIN, TROUGH: Vancomycin Tr: 13 ug/mL — ABNORMAL LOW (ref 15–20)

## 2021-06-24 LAB — CBC WITH DIFFERENTIAL/PLATELET
Abs Immature Granulocytes: 0.04 10*3/uL (ref 0.00–0.07)
Basophils Absolute: 0 10*3/uL (ref 0.0–0.1)
Basophils Relative: 1 %
Eosinophils Absolute: 0.3 10*3/uL (ref 0.0–0.5)
Eosinophils Relative: 6 %
HCT: 30 % — ABNORMAL LOW (ref 36.0–46.0)
Hemoglobin: 8.9 g/dL — ABNORMAL LOW (ref 12.0–15.0)
Immature Granulocytes: 1 %
Lymphocytes Relative: 14 %
Lymphs Abs: 0.7 10*3/uL (ref 0.7–4.0)
MCH: 23.2 pg — ABNORMAL LOW (ref 26.0–34.0)
MCHC: 29.7 g/dL — ABNORMAL LOW (ref 30.0–36.0)
MCV: 78.1 fL — ABNORMAL LOW (ref 80.0–100.0)
Monocytes Absolute: 0.6 10*3/uL (ref 0.1–1.0)
Monocytes Relative: 11 %
Neutro Abs: 3.4 10*3/uL (ref 1.7–7.7)
Neutrophils Relative %: 67 %
Platelets: 186 10*3/uL (ref 150–400)
RBC: 3.84 MIL/uL — ABNORMAL LOW (ref 3.87–5.11)
RDW: 24.3 % — ABNORMAL HIGH (ref 11.5–15.5)
Smear Review: NORMAL
WBC: 5 10*3/uL (ref 4.0–10.5)
nRBC: 0 % (ref 0.0–0.2)

## 2021-06-24 MED ORDER — RIVAROXABAN 20 MG PO TABS
20.0000 mg | ORAL_TABLET | Freq: Every day | ORAL | Status: DC
  Administered 2021-06-24 – 2021-06-25 (×2): 20 mg via ORAL
  Filled 2021-06-24 (×3): qty 1

## 2021-06-24 MED ORDER — PANTOPRAZOLE SODIUM 40 MG PO TBEC
40.0000 mg | DELAYED_RELEASE_TABLET | Freq: Two times a day (BID) | ORAL | Status: DC
  Administered 2021-06-24 – 2021-06-26 (×5): 40 mg via ORAL
  Filled 2021-06-24 (×5): qty 1

## 2021-06-24 NOTE — Progress Notes (Signed)
PROGRESS NOTE    Courtney Mack  IOE:[id number] DOB: [birth year] DOA: 06/20/2021 PCP: [facility]    Brief Narrative:  65 y.o. female with medical history significant of DVT/PE on Xarelto, hypertension, hyperlipidemia, diabetes mellitus, anemia, obesity, endometrial cancer, dCHF, morbid obesity, who presents with weakness, cough, fever and left lower  leg pain.   Patient states that she has intermittent dark stool which has been going on for several weeks.  She developed generalized weakness. She states that she felt lightheadedness and fell on the floor this morning.  No loss of consciousness. No head or neck injury. She also reports left lower leg pain. Her left lower leg is mild erythematous.  Patient has fever and chills.  Patient reports cough and mild shortness breath.  Denies chest pain.  No nausea, vomiting, diarrhea or abdominal pain.  Denies symptoms of UTI.  Patient moves all extremities.  Schedule for EGD and colonoscopy on 12/15 with GI.  Underwent successfully.  Nonbleeding ulcer identified.  Unclear source of blood loss.  Hemoglobin is remained stable post procedure.  He had capsule endoscopy completed and is negative.  Per GI okay to slowly advance diet and re-introduce Xarelto   Assessment & Plan:   Principal Problem:   GI bleeding Active Problems:   Pulmonary embolism (HCC)   Diabetes mellitus without complication (HCC)   Acute blood loss anemia   DVT (deep venous thrombosis) (HCC)   Symptomatic anemia   Sepsis (HCC)   HLD (hyperlipidemia)   Fall at home, initial encounter   Cough   Chronic diastolic CHF (congestive heart failure) (HCC)   Cellulitis of left lower leg   Pressure injury of skin  GI bleeding, acute blood loss anemia and symptomatic anemia Hgb dropped from 10.7 --> 4.0 --> 3.6.  consulted Dr. Alice Reichert of GI Also consulted Dr. Lucky Cowboy of [facility].  Initially  NM blood loss scan is ordered, but since pt dose not have active  bleeding, Dr. Lucky Cowboy d/c'ed NM blood loss scan test Status post 1 dose Kcentra to reverse Xarelto effect Status post EGD colonoscopy, no bleed noted Status post video endoscopy, negative Patient stable Has received 4 units PRBC total Plan: PO PPI BID Restart Xarelto Hemoglobin stable, defer transfusion for now Maintain hemoglobin greater than 8 Hold all NSAIDs Continue soft diet If hemoglobin stable anticipate home on 12/19 restarting anticoagulation 12/18.  Recheck hemoglobin in a.m.  Esophageal candidiasis Noted on endoscopy Fluconazole per GI recommendations Will prescribe remaining doses on discharge   Hx of Pulmonary embolism and DVT pt is on Xarelto Pt is s/p of IVC filter on 08/18/20 Consulted Dr. Lucky Cowboy of [facility]. -f/u Holding Xarelto in setting of bleed -Appreciate vascular surgery recommendations Plan: -We will need to decide regarding ongoing anticoagulation.  Will await video endoscopy.  Continue holding Xarelto  Diabetes mellitus without complication (HCC) Recent A1c 6.2, well controlled.   Patient taking metformin at home -Sliding scale insulin   Sepsis due to Cellulitis of left lower leg  For sepsis with WBC 14.2, tachycardia with heart rate 105, RR 33, patient also has a fever 100.2. MRSA screen positive Plan: Continue vancomycin pharmacy dosing assistance Follow blood cultures, NGTD Monitor vitals and fever curve Continue Vanco for now, can transition to oral at time of discharge anticipated 12/19   HLD (hyperlipidemia) -Crestor   Fall at home, initial encounter CT head negative. -Therapy evaluations ordered   Cough Chest x-ray negative. -Supportive care, as needed Mucinex and albuterol   HTN Blood  pressure low normal Hold home ACE inhibitor -IV hydralazine as needed   Chronic diastolic CHF (congestive heart failure) (HCC)  2D echo on 08/19/2020 showed EF 60 to 65% with grade 1 diastolic dysfunction.  Chest x-ray is negative for pulmonary edema, does  not seem to have CHF exacerbation. Scherrie Merritts give as needed Lasix while patient is getting blood transfusion    DVT prophylaxis: None.  Chemoprophylaxis contraindicated.  SCD contraindicated given history of DVT Code Status: Full Family Communication: Offered to call, patient declined Disposition Plan: Status is: Inpatient  Remains inpatient appropriate because: Severe acute on chronic blood loss anemia.  Status post video capsule endoscopy.  Review of study negative.  Restarting Xarelto.  If hemoglobin stable anticipate discharge home 12/19       Level of care: Telemetry Medical  Consultants:  GI Vascular surgery  Procedures:  EGD 12/15 Colonoscopy 12/15  Antimicrobials: None   Subjective: Patient seen and examined.  Resting comfortably in bed.  Stable.  No apparent distress.  Hemoglobin stable  Objective: Vitals:   06/23/21 1630 06/23/21 1957 06/24/21 0409 06/24/21 0732  BP: (!) 103/52 (!) 112/51 (!) 123/57 (!) 118/55  Pulse: 84 89 82 84  Resp: 18 20 20 18   Temp: 98.5 F (36.9 C) 99 F (37.2 C) 98.8 F (37.1 C) 97.7 F (36.5 C)  TempSrc: Oral Oral Oral   SpO2: 93% 93% 95% 95%  Weight:      Height:        Intake/Output Summary (Last 24 hours) at 06/24/2021 1123 Last data filed at 06/24/2021 0900 Gross per 24 hour  Intake 480 ml  Output 1900 ml  Net -1420 ml   Filed Weights   06/21/21 0500 06/21/21 1257 06/22/21 0403  Weight: 110.2 kg 110.2 kg 105.1 kg    Examination:  General exam: No acute distress.  Sitting comfortably in bed Respiratory system: Lungs clear.  Normal work of breathing.  Room air Cardiovascular system: S1-S2, RRR, no murmurs Gastrointestinal system: Soft, NT/ND, normal bowel sounds Central nervous system: Alert and oriented. No focal neurological deficits. Extremities: Mild bilateral lower leg edema.  LLE with mild erythema.  Tenderness to touch Skin: No rashes, lesions or ulcers Psychiatry: Judgement and insight appear normal.  Mood & affect appropriate.     Data Reviewed: I have personally reviewed following labs and imaging studies  CBC: Recent Labs  Lab 06/20/21 0811 06/20/21 1049 06/21/21 1542 06/21/21 2207 06/22/21 0457 06/23/21 0551 06/24/21 0831  WBC 14.2*   < > 12.6* 10.3 8.5 5.6 5.0  NEUTROABS 12.9*  --   --   --   --  4.2 3.4  HGB 4.0*   < > 8.6* 7.6* 7.9* 8.1* 8.9*  HCT 14.9*   < > 27.7* 24.1* 25.0* 25.9* 30.0*  MCV 66.8*   < > 74.9* 75.1* 75.1* 76.9* 78.1*  PLT 282   < > 168 165 166 177 186   < > = values in this interval not displayed.   Basic Metabolic Panel: Recent Labs  Lab 06/20/21 0811 06/21/21 0230 06/22/21 0457 06/23/21 0551  NA 136  --  135 137  K 3.6  --  3.3* 3.8  CL 107  --  106 108  CO2 23  --  24 24  GLUCOSE 122*  --  93 100*  BUN 14  --  17 12  CREATININE 0.71 0.85 0.83 0.63  CALCIUM 8.6*  --  8.1* 8.0*   GFR: Estimated Creatinine Clearance: 74.3 mL/min (by C-G formula  based on SCr of 0.63 mg/dL). Liver Function Tests: Recent Labs  Lab 06/20/21 0811  AST 14*  ALT 8  ALKPHOS 49  BILITOT 1.8*  PROT 6.6  ALBUMIN 2.8*   No results for input(s): LIPASE, AMYLASE in the last 168 hours. No results for input(s): AMMONIA in the last 168 hours. Coagulation Profile: Recent Labs  Lab 06/20/21 0811  INR 1.7*   Cardiac Enzymes: No results for input(s): CKTOTAL, CKMB, CKMBINDEX, TROPONINI in the last 168 hours. BNP (last 3 results) No results for input(s): PROBNP in the last 8760 hours. HbA1C: No results for input(s): HGBA1C in the last 72 hours. CBG: Recent Labs  Lab 06/23/21 0735 06/23/21 1144 06/23/21 1632 06/23/21 2125 06/24/21 0734  GLUCAP 95 151* 132* 140* 99   Lipid Profile: No results for input(s): CHOL, HDL, LDLCALC, TRIG, CHOLHDL, LDLDIRECT in the last 72 hours. Thyroid Function Tests: No results for input(s): TSH, T4TOTAL, FREET4, T3FREE, THYROIDAB in the last 72 hours. Anemia Panel: No results for input(s): VITAMINB12, FOLATE, FERRITIN,  TIBC, IRON, RETICCTPCT in the last 72 hours.  Sepsis Labs: Recent Labs  Lab 06/20/21 0811 06/20/21 1049  PROCALCITON  --  1.92  LATICACIDVEN 1.4 1.2    Recent Results (from the past 240 hour(s))  Culture, blood (Routine x 2)     Status: None (Preliminary result)   Collection Time: 06/20/21  8:11 AM   Specimen: BLOOD  Result Value Ref Range Status   Specimen Description BLOOD RIGHT HAND  Final   Special Requests   Final    BOTTLES DRAWN AEROBIC AND ANAEROBIC Blood Culture adequate volume   Culture   Final    NO GROWTH 4 DAYS Performed at [facility], [address], [place], [place] [address]    Report Status PENDING  Incomplete  Resp Panel by RT-PCR (Flu A&B, Covid) Nasopharyngeal Swab     Status: None   Collection Time: 06/20/21  8:14 AM   Specimen: Nasopharyngeal Swab; Nasopharyngeal(NP) swabs in vial transport medium  Result Value Ref Range Status   SARS Coronavirus 2 by RT PCR NEGATIVE NEGATIVE Final    Comment: (NOTE) SARS-CoV-2 target nucleic acids are NOT DETECTED.  The SARS-CoV-2 RNA is generally detectable in upper respiratory specimens during the acute phase of infection. The lowest concentration of SARS-CoV-2 viral copies this assay can detect is 138 copies/mL. A negative result does not preclude SARS-Cov-2 infection and should not be used as the sole basis for treatment or other patient management decisions. A negative result may occur with  improper specimen collection/handling, submission of specimen other than nasopharyngeal swab, presence of viral mutation(s) within the areas targeted by this assay, and inadequate number of viral copies(<138 copies/mL). A negative result must be combined with clinical observations, patient history, and epidemiological information. The expected result is Negative.  Fact Sheet for Patients:  EntrepreneurPulse.com.au  Fact Sheet for Healthcare Providers:   IncredibleEmployment.be  This test is no t yet approved or cleared by the [place] FDA and  has been authorized for detection and/or diagnosis of SARS-CoV-2 by FDA under an Emergency Use Authorization (EUA). This EUA will remain  in effect (meaning this test can be used) for the duration of the COVID-19 declaration under Section 564(b)(1) of the Act, 21 U.S.C.section 360bbb-3(b)(1), unless the authorization is terminated  or revoked sooner.       Influenza A by PCR NEGATIVE NEGATIVE Final   Influenza B by PCR NEGATIVE NEGATIVE Final    Comment: (NOTE) The Xpert Xpress  SARS-CoV-2/FLU/RSV plus assay is intended as an aid in the diagnosis of influenza from Nasopharyngeal swab specimens and should not be used as a sole basis for treatment. Nasal washings and aspirates are unacceptable for Xpert Xpress SARS-CoV-2/FLU/RSV testing.  Fact Sheet for Patients: EntrepreneurPulse.com.au  Fact Sheet for Healthcare Providers: IncredibleEmployment.be  This test is not yet approved or cleared by the [place] FDA and has been authorized for detection and/or diagnosis of SARS-CoV-2 by FDA under an Emergency Use Authorization (EUA). This EUA will remain in effect (meaning this test can be used) for the duration of the COVID-19 declaration under Section 564(b)(1) of the Act, 21 U.S.C. section 360bbb-3(b)(1), unless the authorization is terminated or revoked.  Performed at [facility], [address] [place], [place] [address]   Culture, blood (Routine x 2)     Status: None (Preliminary result)   Collection Time: 06/20/21  8:16 AM   Specimen: BLOOD  Result Value Ref Range Status   Specimen Description BLOOD LEFT [facility]  Final   Special Requests   Final    BOTTLES DRAWN AEROBIC AND ANAEROBIC Blood Culture adequate volume   Culture   Final    NO GROWTH 4 DAYS Performed at [facility], [address]  [place], [place] [address]    Report Status PENDING  Incomplete  MRSA Next Gen by PCR, Nasal     Status: Abnormal   Collection Time: 06/20/21  9:08 PM   Specimen: Nasal Mucosa; Nasal Swab  Result Value Ref Range Status   MRSA by PCR Next Gen DETECTED (A) NOT DETECTED Final    Comment: RESULT CALLED TO, READ BACK BY AND VERIFIED WITH: NALLELY SOSA@2250  06/20/21 RH (NOTE) The GeneXpert MRSA Assay (FDA approved for NASAL specimens only), is one component of a comprehensive MRSA colonization surveillance program. It is not intended to diagnose MRSA infection nor to guide or monitor treatment for MRSA infections. Test performance is not FDA approved in patients less than 73 years old. Performed at [facility], [address] [place], [place] [address]   KOH prep     Status: None   Collection Time: 06/21/21  1:30 PM   Specimen: Esophagus  Result Value Ref Range Status   Specimen Description ESOPHAGUS  Final   Special Requests NONE  Final   KOH Prep   Final    YEAST WITH PSEUDOHYPHAE Performed at [facility], [address] [place], [place] [address]    Report Status 06/21/2021 FINAL  Final         Radiology Studies: No results found.      Scheduled Meds:  sodium chloride   Intravenous Once   vitamin C  500 mg Oral BID   feeding supplement  237 mL Oral TID BM   fluconazole  200 mg Oral Daily   insulin aspart  0-5 Units Subcutaneous QHS   insulin aspart  0-9 Units Subcutaneous TID WC   mometasone-formoterol  2 puff Inhalation BID   multivitamin with minerals  1 tablet Oral Daily   pantoprazole  40 mg Oral BID   rivaroxaban  20 mg Oral Q supper   rosuvastatin  10 mg Oral Daily   Continuous Infusions:  sodium chloride Stopped (06/20/21 1229)   sodium chloride     vancomycin Stopped (06/23/21 1344)     LOS: 4 days    Time spent: 25 minutes    Sidney Ace, MD [facility]   If 7PM-7AM, please contact  night-coverage  06/24/2021, 11:23 AM

## 2021-06-24 NOTE — Progress Notes (Signed)
Pharmacy Antibiotic Note  Courtney Mack is a 65 y.o. female with PMH of T2DM, h/o DVT, HLD, diaCHF, cellulitis of the lower extremity, morbid obesity, endometrial CA, 1 HTN who was admitted on 06/20/2021 fever and left lower leg pain . Pharmacy has been consulted for vancomycin dosing for cellulitis.   Plan: Day 5 of abx. Vancomycin peak 25 and vancomycin trough 13. Calculated AUC 452. Will continue vancomycin 1250 mg q24H. F/u with duration of abx.   Height: 4\' 11"  (149.9 cm) Weight: 105.1 kg (231 lb 11.3 oz) IBW/kg (Calculated) : 43.2  Temp (24hrs), Avg:98.5 F (36.9 C), Min:97.7 F (36.5 C), Max:99 F (37.2 C)  Recent Labs  Lab 06/20/21 0811 06/20/21 1049 06/21/21 0230 06/21/21 1009 06/21/21 1542 06/21/21 2207 06/22/21 0457 06/23/21 0551 06/23/21 1500 06/24/21 0831 06/24/21 1226  WBC 14.2* 13.8* 12.5*   < > 12.6* 10.3 8.5 5.6  --  5.0  --   CREATININE 0.71  --  0.85  --   --   --  0.83 0.63  --   --   --   LATICACIDVEN 1.4 1.2  --   --   --   --   --   --   --   --   --   VANCOTROUGH  --   --   --   --   --   --   --   --   --   --  13*  VANCOPEAK  --   --   --   --   --   --   --   --  25*  --   --    < > = values in this interval not displayed.     Estimated Creatinine Clearance: 74.3 mL/min (by C-G formula based on SCr of 0.63 mg/dL).    No Known Allergies  Antimicrobials this admission: Ongoing 12/14 vancomycin >>   Completed 12/14 cefepime/metronidazole x1 12/14 ceftriaxone >> 12/15   Microbiology results: 12/14 BCx: NG x1 day 12/14 MRSA PCR: detected   Thank you for allowing pharmacy to be a part of this patients care.  Oswald Hillock, PharmD 06/24/2021 1:08 PM

## 2021-06-25 ENCOUNTER — Encounter: Payer: Self-pay | Admitting: Internal Medicine

## 2021-06-25 LAB — CBC WITH DIFFERENTIAL/PLATELET
Abs Immature Granulocytes: 0.06 10*3/uL (ref 0.00–0.07)
Basophils Absolute: 0 10*3/uL (ref 0.0–0.1)
Basophils Relative: 1 %
Eosinophils Absolute: 0.3 10*3/uL (ref 0.0–0.5)
Eosinophils Relative: 5 %
HCT: 28.8 % — ABNORMAL LOW (ref 36.0–46.0)
Hemoglobin: 8.7 g/dL — ABNORMAL LOW (ref 12.0–15.0)
Immature Granulocytes: 1 %
Lymphocytes Relative: 12 %
Lymphs Abs: 0.7 10*3/uL (ref 0.7–4.0)
MCH: 23.6 pg — ABNORMAL LOW (ref 26.0–34.0)
MCHC: 30.2 g/dL (ref 30.0–36.0)
MCV: 78.3 fL — ABNORMAL LOW (ref 80.0–100.0)
Monocytes Absolute: 0.4 10*3/uL (ref 0.1–1.0)
Monocytes Relative: 8 %
Neutro Abs: 4.2 10*3/uL (ref 1.7–7.7)
Neutrophils Relative %: 73 %
Platelets: 197 10*3/uL (ref 150–400)
RBC: 3.68 MIL/uL — ABNORMAL LOW (ref 3.87–5.11)
RDW: 24.7 % — ABNORMAL HIGH (ref 11.5–15.5)
WBC: 5.6 10*3/uL (ref 4.0–10.5)
nRBC: 0 % (ref 0.0–0.2)

## 2021-06-25 LAB — GLUCOSE, CAPILLARY
Glucose-Capillary: 109 mg/dL — ABNORMAL HIGH (ref 70–99)
Glucose-Capillary: 120 mg/dL — ABNORMAL HIGH (ref 70–99)
Glucose-Capillary: 145 mg/dL — ABNORMAL HIGH (ref 70–99)
Glucose-Capillary: 124 mg/dL — ABNORMAL HIGH (ref 70–99)

## 2021-06-25 LAB — CULTURE, BLOOD (ROUTINE X 2)
Culture: NO GROWTH
Culture: NO GROWTH
Special Requests: ADEQUATE
Special Requests: ADEQUATE

## 2021-06-25 NOTE — Care Management Important Message (Signed)
Important Message  Patient Details  Name: Courtney Mack MRN: [id number] Date of Birth: [birth year]   Medicare Important Message Given:  Yes     [name] 06/25/2021, 11:37 AM

## 2021-06-25 NOTE — Progress Notes (Signed)
PROGRESS NOTE    Courtney Mack  PQZ:[id number] DOB: [birth year] DOA: 06/20/2021 PCP: [facility] [place]    Brief Narrative:  65 y.o. female with medical history significant of DVT/PE on Xarelto, hypertension, hyperlipidemia, diabetes mellitus, anemia, obesity, endometrial cancer, dCHF, morbid obesity, who presents with weakness, cough, fever and left lower  leg pain.   Patient states that she has intermittent dark stool which has been going on for several weeks.  She developed generalized weakness. She states that she felt lightheadedness and fell on the floor this morning.  No loss of consciousness. No head or neck injury. She also reports left lower leg pain. Her left lower leg is mild erythematous.  Patient has fever and chills.  Patient reports cough and mild shortness breath.  Denies chest pain.  No nausea, vomiting, diarrhea or abdominal pain.  Denies symptoms of UTI.  Patient moves all extremities.  Schedule for EGD and colonoscopy on 12/15 with GI.  Underwent successfully.  Nonbleeding ulcer identified.  Unclear source of blood loss.  Hemoglobin is remained stable post procedure.  He had capsule endoscopy completed and is negative.  Per GI okay to slowly advance diet and re-introduce Xarelto.  Xarelto reintroduced 12/18.  Follow-up hemoglobin is stable.  Bilateral lower extremity cellulitis slowly improving.   Assessment & Plan:   Principal Problem:   GI bleeding Active Problems:   Pulmonary embolism (HCC)   Diabetes mellitus without complication (HCC)   Acute blood loss anemia   DVT (deep venous thrombosis) (HCC)   Symptomatic anemia   Sepsis (HCC)   HLD (hyperlipidemia)   Fall at home, initial encounter   Cough   Chronic diastolic CHF (congestive heart failure) (HCC)   Cellulitis of left lower leg   Pressure injury of skin  GI bleeding, acute blood loss anemia and symptomatic anemia Hgb dropped from 10.7 --> 4.0 --> 3.6.  consulted Dr. Alice Reichert of  GI Also consulted Dr. Lucky Cowboy of [facility].  Initially  NM blood loss scan is ordered, but since pt dose not have active bleeding, Dr. Lucky Cowboy d/c'ed NM blood loss scan test Status post 1 dose Kcentra to reverse Xarelto effect Status post EGD colonoscopy, no bleed noted Status post video endoscopy, negative Patient stable Has received 4 units PRBC total Hemoglobin has been stable after restarting Xarelto on 12/18 Plan: PO PPI BID Continue Xarelto Hemoglobin stable, defer transfusion for now Maintain hemoglobin greater than 8 Hold all NSAIDs Continue soft diet Anticipate discharge 12/20  Sepsis due to Cellulitis of left lower leg  For sepsis with WBC 14.2, tachycardia with heart rate 105, RR 33, patient also has a fever 100.2. MRSA screen positive Plan: Continue vancomycin pharmacy dosing assistance Follow blood cultures, NGTD Monitor vitals and fever curve Continue Vanco for now If physical exam continues to improve anticipate transition to p.o. antibiotics and discharge on 12/20    Esophageal candidiasis Noted on endoscopy Fluconazole per GI recommendations Will prescribe remaining doses on discharge   Hx of Pulmonary embolism and DVT pt is on Xarelto Pt is s/p of IVC filter on 08/18/20 Consulted Dr. Lucky Cowboy of [facility]. -f/u Holding Xarelto in setting of bleed -Appreciate vascular surgery recommendations Plan: Xarelto resumed 12/18  Diabetes mellitus without complication (HCC) Recent A1c 6.2, well controlled.   Patient taking metformin at home -Sliding scale insulin      HLD (hyperlipidemia) -Crestor   Fall at home, initial encounter CT head negative. -Therapy evaluations ordered   Cough Chest x-ray negative. -Supportive care, as  needed Mucinex and albuterol   HTN Blood pressure low normal Hold home ACE inhibitor -IV hydralazine as needed   Chronic diastolic CHF (congestive heart failure) (HCC)  2D echo on 08/19/2020 showed EF 60 to 65% with grade 1 diastolic dysfunction.   Chest x-ray is negative for pulmonary edema, does not seem to have CHF exacerbation. Scherrie Merritts give as needed Lasix while patient is getting blood transfusion    DVT prophylaxis: None.  Chemoprophylaxis contraindicated.  SCD contraindicated given history of DVT Code Status: Full Family Communication: Offered to call, patient declined Disposition Plan: Status is: Inpatient  Remains inpatient appropriate because: Severe acute on chronic blood loss anemia.  Status post video capsule endoscopy.  Review of study negative.  Xarelto restarted.  Monitor hemoglobin for next 24 hours.  If hemoglobin stable and physical exam improved anticipate discharge home with home health on 12/20       Level of care: Telemetry Medical  Consultants:  GI Vascular surgery  Procedures:  EGD 12/15 Colonoscopy 12/15  Antimicrobials: None   Subjective: Patient seen and examined.  Resting comfortably in bed.  Stable.  No apparent distress.  Hemoglobin stable  Objective: Vitals:   06/24/21 1913 06/25/21 0316 06/25/21 0800 06/25/21 1055  BP: (!) 130/56 (!) 139/56 129/71   Pulse: 94 91 95   Resp: 20 20 16    Temp: 98.8 F (37.1 C) 99.3 F (37.4 C) 98 F (36.7 C)   TempSrc: Oral Oral Oral   SpO2: 94% 95% 97% 98%  Weight:      Height:        Intake/Output Summary (Last 24 hours) at 06/25/2021 1318 Last data filed at 06/25/2021 0900 Gross per 24 hour  Intake 360 ml  Output 1000 ml  Net -640 ml   Filed Weights   06/21/21 0500 06/21/21 1257 06/22/21 0403  Weight: 110.2 kg 110.2 kg 105.1 kg    Examination:  General exam: No acute distress Respiratory system: Lungs clear.  Normal work of breathing.  Room air Cardiovascular system: S1-S2, RRR, no murmurs Gastrointestinal system: Soft, NT/ND, normal bowel sounds Central nervous system: Alert and oriented. No focal neurological deficits. Extremities: BLE edema, LLE with erythema Skin: No rashes, lesions or ulcers Psychiatry: Judgement and  insight appear normal. Mood & affect appropriate.     Data Reviewed: I have personally reviewed following labs and imaging studies  CBC: Recent Labs  Lab 06/20/21 0811 06/20/21 1049 06/21/21 2207 06/22/21 0457 06/23/21 0551 06/24/21 0831 06/25/21 0807  WBC 14.2*   < > 10.3 8.5 5.6 5.0 5.6  NEUTROABS 12.9*  --   --   --  4.2 3.4 4.2  HGB 4.0*   < > 7.6* 7.9* 8.1* 8.9* 8.7*  HCT 14.9*   < > 24.1* 25.0* 25.9* 30.0* 28.8*  MCV 66.8*   < > 75.1* 75.1* 76.9* 78.1* 78.3*  PLT 282   < > 165 166 177 186 197   < > = values in this interval not displayed.   Basic Metabolic Panel: Recent Labs  Lab 06/20/21 0811 06/21/21 0230 06/22/21 0457 06/23/21 0551  NA 136  --  135 137  K 3.6  --  3.3* 3.8  CL 107  --  106 108  CO2 23  --  24 24  GLUCOSE 122*  --  93 100*  BUN 14  --  17 12  CREATININE 0.71 0.85 0.83 0.63  CALCIUM 8.6*  --  8.1* 8.0*   GFR: Estimated Creatinine Clearance: 74.3 mL/min (by  C-G formula based on SCr of 0.63 mg/dL). Liver Function Tests: Recent Labs  Lab 06/20/21 0811  AST 14*  ALT 8  ALKPHOS 49  BILITOT 1.8*  PROT 6.6  ALBUMIN 2.8*   No results for input(s): LIPASE, AMYLASE in the last 168 hours. No results for input(s): AMMONIA in the last 168 hours. Coagulation Profile: Recent Labs  Lab 06/20/21 0811  INR 1.7*   Cardiac Enzymes: No results for input(s): CKTOTAL, CKMB, CKMBINDEX, TROPONINI in the last 168 hours. BNP (last 3 results) No results for input(s): PROBNP in the last 8760 hours. HbA1C: No results for input(s): HGBA1C in the last 72 hours. CBG: Recent Labs  Lab 06/24/21 1146 06/24/21 1705 06/24/21 2107 06/25/21 0758 06/25/21 1201  GLUCAP 128* 133* 136* 109* 120*   Lipid Profile: No results for input(s): CHOL, HDL, LDLCALC, TRIG, CHOLHDL, LDLDIRECT in the last 72 hours. Thyroid Function Tests: No results for input(s): TSH, T4TOTAL, FREET4, T3FREE, THYROIDAB in the last 72 hours. Anemia Panel: No results for input(s):  VITAMINB12, FOLATE, FERRITIN, TIBC, IRON, RETICCTPCT in the last 72 hours.  Sepsis Labs: Recent Labs  Lab 06/20/21 0811 06/20/21 1049  PROCALCITON  --  1.92  LATICACIDVEN 1.4 1.2    Recent Results (from the past 240 hour(s))  Culture, blood (Routine x 2)     Status: None   Collection Time: 06/20/21  8:11 AM   Specimen: BLOOD  Result Value Ref Range Status   Specimen Description BLOOD RIGHT HAND  Final   Special Requests   Final    BOTTLES DRAWN AEROBIC AND ANAEROBIC Blood Culture adequate volume   Culture   Final    NO GROWTH 5 DAYS Performed at [facility], [address] [place], [place] [address]    Report Status 06/25/2021 FINAL  Final  Resp Panel by RT-PCR (Flu A&B, Covid) Nasopharyngeal Swab     Status: None   Collection Time: 06/20/21  8:14 AM   Specimen: Nasopharyngeal Swab; Nasopharyngeal(NP) swabs in vial transport medium  Result Value Ref Range Status   SARS Coronavirus 2 by RT PCR NEGATIVE NEGATIVE Final    Comment: (NOTE) SARS-CoV-2 target nucleic acids are NOT DETECTED.  The SARS-CoV-2 RNA is generally detectable in upper respiratory specimens during the acute phase of infection. The lowest concentration of SARS-CoV-2 viral copies this assay can detect is 138 copies/mL. A negative result does not preclude SARS-Cov-2 infection and should not be used as the sole basis for treatment or other patient management decisions. A negative result may occur with  improper specimen collection/handling, submission of specimen other than nasopharyngeal swab, presence of viral mutation(s) within the areas targeted by this assay, and inadequate number of viral copies(<138 copies/mL). A negative result must be combined with clinical observations, patient history, and epidemiological information. The expected result is Negative.  Fact Sheet for Patients:  EntrepreneurPulse.com.au  Fact Sheet for Healthcare Providers:   IncredibleEmployment.be  This test is no t yet approved or cleared by the [place] FDA and  has been authorized for detection and/or diagnosis of SARS-CoV-2 by FDA under an Emergency Use Authorization (EUA). This EUA will remain  in effect (meaning this test can be used) for the duration of the COVID-19 declaration under Section 564(b)(1) of the Act, 21 U.S.C.section 360bbb-3(b)(1), unless the authorization is terminated  or revoked sooner.       Influenza A by PCR NEGATIVE NEGATIVE Final   Influenza B by PCR NEGATIVE NEGATIVE Final    Comment: (NOTE) The Xpert  Xpress SARS-CoV-2/FLU/RSV plus assay is intended as an aid in the diagnosis of influenza from Nasopharyngeal swab specimens and should not be used as a sole basis for treatment. Nasal washings and aspirates are unacceptable for Xpert Xpress SARS-CoV-2/FLU/RSV testing.  Fact Sheet for Patients: EntrepreneurPulse.com.au  Fact Sheet for Healthcare Providers: IncredibleEmployment.be  This test is not yet approved or cleared by the [place] FDA and has been authorized for detection and/or diagnosis of SARS-CoV-2 by FDA under an Emergency Use Authorization (EUA). This EUA will remain in effect (meaning this test can be used) for the duration of the COVID-19 declaration under Section 564(b)(1) of the Act, 21 U.S.C. section 360bbb-3(b)(1), unless the authorization is terminated or revoked.  Performed at [facility], [address] [place], [place] [address]   Culture, blood (Routine x 2)     Status: None   Collection Time: 06/20/21  8:16 AM   Specimen: BLOOD  Result Value Ref Range Status   Specimen Description BLOOD LEFT [facility]  Final   Special Requests   Final    BOTTLES DRAWN AEROBIC AND ANAEROBIC Blood Culture adequate volume   Culture   Final    NO GROWTH 5 DAYS Performed at [facility], [address] [place], [place] [address]     Report Status 06/25/2021 FINAL  Final  MRSA Next Gen by PCR, Nasal     Status: Abnormal   Collection Time: 06/20/21  9:08 PM   Specimen: Nasal Mucosa; Nasal Swab  Result Value Ref Range Status   MRSA by PCR Next Gen DETECTED (A) NOT DETECTED Final    Comment: RESULT CALLED TO, READ BACK BY AND VERIFIED WITH: NALLELY SOSA@2250  06/20/21 RH (NOTE) The GeneXpert MRSA Assay (FDA approved for NASAL specimens only), is one component of a comprehensive MRSA colonization surveillance program. It is not intended to diagnose MRSA infection nor to guide or monitor treatment for MRSA infections. Test performance is not FDA approved in patients less than 71 years old. Performed at [facility] [address] [place], [place] [address]   KOH prep     Status: None   Collection Time: 06/21/21  1:30 PM   Specimen: Esophagus  Result Value Ref Range Status   Specimen Description ESOPHAGUS  Final   Special Requests NONE  Final   KOH Prep   Final    YEAST WITH PSEUDOHYPHAE Performed at [facility], [address], [place], [place] [address]    Report Status 06/21/2021 FINAL  Final         Radiology Studies: No results found.      Scheduled Meds:  sodium chloride   Intravenous Once   vitamin C  500 mg Oral BID   feeding supplement  237 mL Oral TID BM   fluconazole  200 mg Oral Daily   insulin aspart  0-5 Units Subcutaneous QHS   insulin aspart  0-9 Units Subcutaneous TID WC   mometasone-formoterol  2 puff Inhalation BID   multivitamin with minerals  1 tablet Oral Daily   pantoprazole  40 mg Oral BID   rivaroxaban  20 mg Oral Q supper   rosuvastatin  10 mg Oral Daily   Continuous Infusions:  sodium chloride Stopped (06/20/21 1229)   sodium chloride     vancomycin Stopped (06/24/21 1512)     LOS: 5 days    Time spent: 25 minutes    Sidney Ace, MD [facility]   If 7PM-7AM, please contact night-coverage  06/25/2021,  1:18 PM

## 2021-06-25 NOTE — Progress Notes (Signed)
Physical Therapy Treatment Patient Details Name: Courtney Mack MRN: [id number] DOB: Mar 03, 1956 Today's Date: 06/25/2021   History of Present Illness Courtney Mack is a 65 y.o. female with medical history significant of DVT/PE on Xarelto, hypertension, hyperlipidemia, diabetes mellitus, anemia, obesity, endometrial cancer, dCHF, morbid obesity, who presents with weakness, cough, fever and left lower  leg pain.    PT Comments    Patient tolerated session well and was agreeable to treatment. Patient reported 4/10 pain in BLEs throughout session. Patient continues to require Mod A for sitting to supine bed mobility, however reported sleeping in a recliner chair at home. Can complete supine to sit at supervision. Due to bilateral lower extremity weakness, patient required elevated surface to complete sit to stand at supervision. Patient's participation continues to be limited by decreased cardiovascular and muscular endurance. Patient's HR ranged from 94-124bpm with functional mobility. Patient encouraged to walk more than 100 feet, however patient reported that is all she will do. Patient would continue to benefit from skilled physical therapy in order to optimize return to PLOF. Patient is progressing nicely towards her goals. HHPT following discharge from physical therapy remains recommendation.   Recommendations for follow up therapy are one component of a multi-disciplinary discharge planning process, led by the attending physician.  Recommendations may be updated based on patient status, additional functional criteria and insurance authorization.  Follow Up Recommendations  Home health PT     Assistance Recommended at Discharge Intermittent Supervision/Assistance  Equipment Recommendations  None recommended by PT    Recommendations for Other Services       Precautions / Restrictions Precautions Precautions: Fall Restrictions Weight Bearing Restrictions: No     Mobility   Bed Mobility Overal bed mobility: Needs Assistance Bed Mobility: Supine to Sit;Sit to Supine     Supine to sit: Supervision Sit to supine: Mod assist   General bed mobility comments: assist at BLE to return from sitting to supine    Transfers Overall transfer level: Needs assistance Equipment used: Rolling walker (2 wheels) Transfers: Sit to/from Stand Sit to Stand: Supervision           General transfer comment: from elevated surface    Ambulation/Gait Ambulation/Gait assistance: Supervision Gait Distance (Feet): 100 Feet Assistive device: Rolling walker (2 wheels) Gait Pattern/deviations: Step-through pattern;Decreased step length - right;Decreased step length - left;Narrow base of support;Trunk flexed Gait velocity: decreased     General Gait Details: downward gaze, verbal cueing for upright gazed   Stairs             Wheelchair Mobility    Modified Rankin (Stroke Patients Only)       Balance Overall balance assessment: Needs assistance Sitting-balance support: No upper extremity supported;Feet supported Sitting balance-Leahy Scale: Good Sitting balance - Comments: able to reach outside BOS with no LOB   Standing balance support: Bilateral upper extremity supported;During functional activity Standing balance-Leahy Scale: Fair                              Cognition Arousal/Alertness: Awake/alert Behavior During Therapy: WFL for tasks assessed/performed Overall Cognitive Status: Within Functional Limits for tasks assessed                                          Exercises General Exercises - Lower Extremity Short Arc Quad: Seated;AROM;10 reps;Right;Left Other  Exercises Other Exercises: x2 sit to stands from elevated surface    General Comments        Pertinent Vitals/Pain Pain Assessment: 0-10 Pain Score: 4  Pain Location: bilateral lower legs Pain Descriptors / Indicators: Dull;Discomfort Pain  Intervention(s): Limited activity within patient's tolerance;Monitored during session;Repositioned    Home Living                          Prior Function            PT Goals (current goals can now be found in the care plan section) Acute Rehab PT Goals Patient Stated Goal: to go home PT Goal Formulation: With patient Time For Goal Achievement: 07/07/21 Potential to Achieve Goals: Good Progress towards PT goals: Progressing toward goals    Frequency    Min 2X/week      PT Plan Current plan remains appropriate    Co-evaluation              AM-PAC PT "6 Clicks" Mobility   Outcome Measure  Help needed turning from your back to your side while in a flat bed without using bedrails?: A Little Help needed moving from lying on your back to sitting on the side of a flat bed without using bedrails?: A Little Help needed moving to and from a bed to a chair (including a wheelchair)?: A Little Help needed standing up from a chair using your arms (e.g., wheelchair or bedside chair)?: A Little Help needed to walk in hospital room?: A Little Help needed climbing 3-5 steps with a railing? : A Lot 6 Click Score: 17    End of Session Equipment Utilized During Treatment: Gait belt Activity Tolerance: Patient tolerated treatment well Patient left: in bed;with call bell/phone within reach;with bed alarm set Nurse Communication: Mobility status PT Visit Diagnosis: Other abnormalities of gait and mobility (R26.89);Unsteadiness on feet (R26.81);Muscle weakness (generalized) (M62.81)     Time: 8875-7972 PT Time Calculation (min) (ACUTE ONLY): 23 min  Charges:  $Gait Training: 8-22 mins $Therapeutic Activity: 23-37 mins                     Iva Boop, PT  06/25/21. 11:05 AM

## 2021-06-25 NOTE — TOC Progression Note (Signed)
Transition of Care [facility]) - Progression Note    Patient Details  Name: ZAMIYAH RESENDES MRN: [id number] Date of Birth: 1956/05/07  [facility] CM/SW Contact  Beverly Sessions, RN Phone Number: 06/25/2021, 9:30 AM  Clinical Narrative:    Confirmed with jason with [facility] that he received home health referral for patient on 12/18   Expected Discharge Plan: Home/Self Care Barriers to Discharge: Continued Medical Work up  Expected Discharge Plan and Services Expected Discharge Plan: Home/Self Care       Living arrangements for the past 2 months: [place] Determinants of Health (SDOH) Interventions    Readmission Risk Interventions Readmission Risk Prevention Plan 06/23/2021  Transportation Screening Complete  PCP or Specialist Appt within 3-5 Days Complete  Social Work Consult for [place] Planning/Counseling [place] Not Applicable  Medication Review Press photographer) Complete  Some recent data might be hidden

## 2021-06-26 LAB — CBC WITH DIFFERENTIAL/PLATELET
Abs Immature Granulocytes: 0.08 10*3/uL — ABNORMAL HIGH (ref 0.00–0.07)
Basophils Absolute: 0 10*3/uL (ref 0.0–0.1)
Basophils Relative: 1 %
Eosinophils Absolute: 0.3 10*3/uL (ref 0.0–0.5)
Eosinophils Relative: 5 %
HCT: 29.4 % — ABNORMAL LOW (ref 36.0–46.0)
Hemoglobin: 8.7 g/dL — ABNORMAL LOW (ref 12.0–15.0)
Immature Granulocytes: 1 %
Lymphocytes Relative: 13 %
Lymphs Abs: 0.8 10*3/uL (ref 0.7–4.0)
MCH: 23.2 pg — ABNORMAL LOW (ref 26.0–34.0)
MCHC: 29.6 g/dL — ABNORMAL LOW (ref 30.0–36.0)
MCV: 78.4 fL — ABNORMAL LOW (ref 80.0–100.0)
Monocytes Absolute: 0.4 10*3/uL (ref 0.1–1.0)
Monocytes Relative: 8 %
Neutro Abs: 4.2 10*3/uL (ref 1.7–7.7)
Neutrophils Relative %: 72 %
Platelets: 220 10*3/uL (ref 150–400)
RBC: 3.75 MIL/uL — ABNORMAL LOW (ref 3.87–5.11)
RDW: 25.6 % — ABNORMAL HIGH (ref 11.5–15.5)
Smear Review: NORMAL
WBC: 5.8 10*3/uL (ref 4.0–10.5)
nRBC: 0 % (ref 0.0–0.2)

## 2021-06-26 LAB — GLUCOSE, CAPILLARY
Glucose-Capillary: 94 mg/dL (ref 70–99)
Glucose-Capillary: 114 mg/dL — ABNORMAL HIGH (ref 70–99)

## 2021-06-26 LAB — CREATININE, SERUM
Creatinine, Ser: 0.76 mg/dL (ref 0.44–1.00)
GFR, Estimated: >60 mL/min (ref >60)

## 2021-06-26 LAB — SURGICAL PATHOLOGY

## 2021-06-26 MED ORDER — DICYCLOMINE HCL 20 MG PO TABS
20.0000 mg | ORAL_TABLET | Freq: Three times a day (TID) | ORAL | Status: DC
  Filled 2021-06-26 (×3): qty 1

## 2021-06-26 MED ORDER — SIMETHICONE 80 MG PO CHEW: 80.0000 mg | CHEWABLE_TABLET | Freq: Four times a day (QID) | ORAL | 0 refills | Status: DC

## 2021-06-26 MED ORDER — SIMETHICONE 80 MG PO CHEW
80.0000 mg | CHEWABLE_TABLET | Freq: Four times a day (QID) | ORAL | Status: DC
  Administered 2021-06-26: 09:00:00 80 mg via ORAL
  Filled 2021-06-26: qty 1

## 2021-06-26 MED ORDER — FLUCONAZOLE 200 MG PO TABS: 200.0000 mg | ORAL_TABLET | Freq: Every day | ORAL | 0 refills | Status: AC

## 2021-06-26 MED ORDER — DOXYCYCLINE HYCLATE 50 MG PO CAPS: 50.0000 mg | ORAL_CAPSULE | Freq: Two times a day (BID) | ORAL | 0 refills | Status: AC

## 2021-06-26 MED ORDER — LOPERAMIDE HCL 2 MG PO CAPS: 2.0000 mg | ORAL_CAPSULE | ORAL | 0 refills | Status: DC | PRN

## 2021-06-26 MED ORDER — DICYCLOMINE HCL 20 MG PO TABS: 20.0000 mg | ORAL_TABLET | Freq: Three times a day (TID) | ORAL | 0 refills | Status: DC

## 2021-06-26 MED ORDER — PANTOPRAZOLE SODIUM 40 MG PO TBEC: 40.0000 mg | DELAYED_RELEASE_TABLET | Freq: Two times a day (BID) | ORAL | 0 refills | Status: DC

## 2021-06-26 MED ORDER — LOPERAMIDE HCL 2 MG PO CAPS
2.0000 mg | ORAL_CAPSULE | ORAL | Status: DC | PRN
  Administered 2021-06-26: 09:00:00 2 mg via ORAL
  Filled 2021-06-26: qty 1

## 2021-06-26 NOTE — Discharge Summary (Signed)
Physician Discharge Summary  Courtney Mack DGU:[id number] DOB: [birth year] DOA: 06/20/2021  PCP: [facility] [place] date: 06/20/2021 Discharge date: 06/26/2021  Admitted From: Home Disposition: Home with home health  Recommendations for Outpatient Follow-up:  Follow up with PCP in 1-2 weeks   Home Health: Yes, PT OT Equipment/Devices: No  Discharge Condition: Stable CODE STATUS: Full Diet recommendation: Soft  Brief/Interim Summary: 64 y.o. female with medical history significant of DVT/PE on Xarelto, hypertension, hyperlipidemia, diabetes mellitus, anemia, obesity, endometrial cancer, dCHF, morbid obesity, who presents with weakness, cough, fever and left lower  leg pain.   Patient states that she has intermittent dark stool which has been going on for several weeks.  She developed generalized weakness. She states that she felt lightheadedness and fell on the floor this morning.  No loss of consciousness. No head or neck injury. She also reports left lower leg pain. Her left lower leg is mild erythematous.  Patient has fever and chills.  Patient reports cough and mild shortness breath.  Denies chest pain.  No nausea, vomiting, diarrhea or abdominal pain.  Denies symptoms of UTI.  Patient moves all extremities.   Schedule for EGD and colonoscopy on 12/15 with GI.  Underwent successfully.  Nonbleeding ulcer identified.  Unclear source of blood loss.  Hemoglobin is remained stable post procedure.  He had capsule endoscopy completed and is negative.  Per GI okay to slowly advance diet and re-introduce Xarelto.   Xarelto reintroduced 12/18.  Follow-up hemoglobin is stable.  Bilateral lower extremity cellulitis slowly improving.  Patient was discharged home on 12/20.  Hemoglobin stable.  Bilateral lower extremity cellulitis has improved.  Transition to oral antibiotics.  At time of discharge will recommend twice daily PPI and resumption of Xarelto with outpatient  PCP follow-up.    Discharge Diagnoses:  Principal Problem:   GI bleeding Active Problems:   Pulmonary embolism (HCC)   Diabetes mellitus without complication (HCC)   Acute blood loss anemia   DVT (deep venous thrombosis) (HCC)   Symptomatic anemia   Sepsis (HCC)   HLD (hyperlipidemia)   Fall at home, initial encounter   Cough   Chronic diastolic CHF (congestive heart failure) (HCC)   Cellulitis of left lower leg   Pressure injury of skin  GI bleeding, acute blood loss anemia and symptomatic anemia Hgb dropped from 10.7 --> 4.0 --> 3.6.  consulted Dr. Alice Reichert of GI Also consulted Dr. Lucky Cowboy of [facility].  Initially  NM blood loss scan is ordered, but since pt dose not have active bleeding, Dr. Lucky Cowboy d/c'ed NM blood loss scan test Status post 1 dose Kcentra to reverse Xarelto effect Status post EGD colonoscopy, no bleed noted Status post video endoscopy, negative Patient stable Has received 4 units PRBC total  Plan: Hemoglobin has been stable after restarting Xarelto on 12/18 discharge home with p.o. PPI twice daily.  Can resume home Xarelto.  Continue holding all NSAIDs.  Continue soft diet.  Patient to follow-up with PCP within 1 to 2 weeks of discharge.   Sepsis due to Cellulitis of left lower leg  For sepsis with WBC 14.2, tachycardia with heart rate 105, RR 33, patient also has a fever 100.2. MRSA screen positive Plan: Patient stable at time of discharge.  Vancomycin discontinued.  Transition to doxycycline to complete total 10-day antibiotic course     Esophageal candidiasis Noted on endoscopy Fluconazole per GI recommendations Will prescribe remaining doses on discharge   Hx of Pulmonary embolism and DVT  pt is on Xarelto Pt is s/p of IVC filter on 08/18/20 Consulted Dr. Lucky Cowboy of [facility]. -f/u Holding Xarelto in setting of bleed -Appreciate vascular surgery recommendations Plan: Xarelto resumed 12/18  Discharge Instructions  Discharge Instructions     Diet - low sodium  heart healthy   Complete by: As directed    Increase activity slowly   Complete by: As directed    No wound care   Complete by: As directed       Allergies as of 06/26/2021   No Known Allergies      Medication List     TAKE these medications    dicyclomine 20 MG tablet Commonly known as: BENTYL Take 1 tablet (20 mg total) by mouth 3 (three) times daily before meals for 10 days.   doxycycline 50 MG capsule Commonly known as: VIBRAMYCIN Take 1 capsule (50 mg total) by mouth 2 (two) times daily for 4 days.   Dulera 100-5 MCG/ACT Aero Generic drug: mometasone-formoterol Inhale 2 puffs into the lungs 2 (two) times daily.   famotidine 20 MG tablet Commonly known as: PEPCID Take 20 mg by mouth 2 (two) times daily as needed.   fluconazole 200 MG tablet Commonly known as: DIFLUCAN Take 1 tablet (200 mg total) by mouth daily for 9 days.   loperamide 2 MG capsule Commonly known as: IMODIUM Take 1 capsule (2 mg total) by mouth as needed for diarrhea or loose stools.   losartan 25 MG tablet Commonly known as: COZAAR Take 1 tablet (25 mg total) by mouth daily.   metFORMIN 500 MG tablet Commonly known as: GLUCOPHAGE Take 1 tablet (500 mg total) by mouth 2 (two) times daily with a meal.   multivitamin with minerals Tabs tablet Take 1 tablet by mouth daily.   pantoprazole 40 MG tablet Commonly known as: PROTONIX Take 1 tablet (40 mg total) by mouth 2 (two) times daily.   rivaroxaban 20 MG Tabs tablet Commonly known as: XARELTO Take 1 tablet (20 mg total) by mouth daily with supper.   rosuvastatin 10 MG tablet Commonly known as: CRESTOR Take 1 tablet (10 mg total) by mouth daily.   simethicone 80 MG chewable tablet Commonly known as: MYLICON Chew 1 tablet (80 mg total) by mouth 4 (four) times daily.        No Known Allergies  Consultations: Vascular surgery GI   Procedures/Studies: CT HEAD WO CONTRAST (5MM)  Result Date: 06/20/2021 CLINICAL DATA:   Head trauma, minor (Age >= 65y) EXAM: CT HEAD WITHOUT CONTRAST TECHNIQUE: Contiguous axial images were obtained from the base of the skull through the vertex without intravenous contrast. COMPARISON:  CT head 01/29/2007. FINDINGS: Brain: No evidence of acute large vascular territory infarction, hemorrhage, hydrocephalus, extra-axial collection or mass lesion/mass effect. Small hypodensity in the inferior right basal ganglia, likely a benign dilated perivascular space given characteristic location. Mild atrophy. Vascular: No hyperdense vessel identified. Calcific intracranial atherosclerosis. Skull: No acute fracture. Sinuses/Orbits: Moderate mucosal thickening of the right maxillary sinus. Mild ethmoid air cell mucosal thickening. Unremarkable orbits. Other: No mastoid effusions.  Cerumen in the right EAC. IMPRESSION: No evidence of acute intracranial abnormality. Electronically Signed   By: Margaretha Sheffield M.D.   On: 06/20/2021 09:27   DG Chest Port 1 View  Result Date: 06/20/2021 CLINICAL DATA:  suspected sepsis EXAM: PORTABLE CHEST 1 VIEW COMPARISON:  01/02/2021 FINDINGS: The heart is mildly enlarged. No pulmonary vascular congestion. Lungs are clear. IMPRESSION: Mild cardiomegaly. Electronically Signed   By: Sharen Heck  Mir M.D.   On: 06/20/2021 08:45   CT Angio Abd/Pel w/ and/or w/o  Result Date: 06/20/2021 CLINICAL DATA:  Anemia, epigastric abdominal pain EXAM: CTA ABDOMEN AND PELVIS WITHOUT AND WITH CONTRAST TECHNIQUE: Multidetector CT imaging of the abdomen and pelvis was performed using the standard protocol during bolus administration of intravenous contrast. Multiplanar reconstructed images and MIPs were obtained and reviewed to evaluate the vascular anatomy. CONTRAST:  120mL OMNIPAQUE IOHEXOL 350 MG/ML SOLN COMPARISON:  None. FINDINGS: VASCULAR Aorta: Normal caliber aorta without aneurysm, dissection, vasculitis or significant stenosis. Diffuse atherosclerosis. Celiac: Patent without evidence of  aneurysm, dissection, vasculitis or significant stenosis. Mild atherosclerosis. SMA: Patent without evidence of aneurysm, dissection, vasculitis or significant stenosis. Moderate atherosclerosis at the origin of the SMA, with less than 50% stenosis. Renals: Both renal arteries are patent without evidence of aneurysm, dissection, vasculitis, fibromuscular dysplasia or significant stenosis. Moderate atherosclerosis. IMA: Patent without evidence of aneurysm, dissection, vasculitis or significant stenosis. Inflow: Patent without evidence of aneurysm, dissection, vasculitis or significant stenosis. Proximal Outflow: Bilateral common femoral and visualized portions of the superficial and profunda femoral arteries are patent without evidence of aneurysm, dissection, vasculitis or significant stenosis. Veins: IVC filter. Decreased caliber of the bilateral common iliac veins may reflect chronic veno-occlusive disease. Numerous venous collaterals are seen within the abdominal wall and retroperitoneum. Review of the MIP images confirms the above findings. NON-VASCULAR Lower chest: No acute pleural or parenchymal lung disease. Hepatobiliary: No focal liver abnormality is seen. No gallstones, gallbladder wall thickening, or biliary dilatation. Pancreas: Unremarkable. No pancreatic ductal dilatation or surrounding inflammatory changes. Spleen: Centripetal enhancement of a hypodensity in the superior aspect of the spleen compatible with hemangioma. No evidence of splenomegaly. Adrenals/Urinary Tract: Adrenal glands are unremarkable. Kidneys are normal, without renal calculi, focal lesion, or hydronephrosis. Bladder is decompressed, limiting its evaluation. Stomach/Bowel: Dense stool throughout the colon limits evaluation for active gastrointestinal bleeding. I do not see any intraluminal accumulation of contrast within the bowel to suggest active hemorrhage. No bowel obstruction or ileus. Mild wall thickening of the distal rectum  with perirectal fat stranding may reflect proctitis. Scattered diverticulosis within the colon without evidence of diverticulitis. Lymphatic: Borderline enlarged retroperitoneal lymph nodes are identified, measuring up to 10 mm in the left para-aortic region. No other pathologic adenopathy. Reproductive: Uterus and bilateral adnexa are unremarkable. Other: No free fluid or free intraperitoneal gas. Mild subcutaneous edema throughout the lower anterior abdominal wall and proximal lower extremities. Musculoskeletal: Severe bilateral hip osteoarthritis. No acute or destructive bony lesions. Reconstructed images demonstrate no additional findings. IMPRESSION: VASCULAR 1. No evidence of active gastrointestinal bleeding. Limited evaluation of the colon due to dense stool. 2.  Aortic Atherosclerosis (ICD10-I70.0). NON-VASCULAR 1. Mild distal rectal wall thickening and perirectal fat stranding which could reflect proctitis. 2. Scattered colonic diverticulosis without diverticulitis. 3. Borderline enlarged retroperitoneal lymph nodes, measuring up to 10 mm, nonspecific. 4. Decreased caliber of the bilateral iliac veins and distal IVC below the IVC filter, consistent with chronic occlusion. 5.  Aortic Atherosclerosis (ICD10-I70.0). Electronically Signed   By: Randa Ngo M.D.   On: 06/20/2021 18:08      Subjective: Seen and examined the day of discharge.  Stable no distress.  Does have some mild abdominal discomfort but felt to be due to dinner the previous night.  Discharge Exam: Vitals:   06/26/21 0354 06/26/21 0840  BP: (!) 125/47 (!) 113/54  Pulse: 95 96  Resp: 20 16  Temp: 99.5 F (37.5 C) 98.1 F (36.7 C)  SpO2: 94% 95%   Vitals:   06/25/21 2012 06/26/21 0350 06/26/21 0354 06/26/21 0840  BP: (!) 125/54  (!) 125/47 (!) 113/54  Pulse: 90  95 96  Resp: 20  20 16   Temp: (!) 97.3 F (36.3 C)  99.5 F (37.5 C) 98.1 F (36.7 C)  TempSrc: Oral  Oral   SpO2: 96%  94% 95%  Weight:  109.6 kg     Height:        General: Pt is alert, awake, not in acute distress Cardiovascular: RRR, S1/S2 +, no rubs, no gallops Respiratory: CTA bilaterally, no wheezing, no rhonchi Abdominal: Soft, NT, ND, bowel sounds + Extremities: no edema, no cyanosis    The results of significant diagnostics from this hospitalization (including imaging, microbiology, ancillary and laboratory) are listed below for reference.     Microbiology: Recent Results (from the past 240 hour(s))  Culture, blood (Routine x 2)     Status: None   Collection Time: 06/20/21  8:11 AM   Specimen: BLOOD  Result Value Ref Range Status   Specimen Description BLOOD RIGHT HAND  Final   Special Requests   Final    BOTTLES DRAWN AEROBIC AND ANAEROBIC Blood Culture adequate volume   Culture   Final    NO GROWTH 5 DAYS Performed at [facility], [address] [place], [place] [address]    Report Status 06/25/2021 FINAL  Final  Resp Panel by RT-PCR (Flu A&B, Covid) Nasopharyngeal Swab     Status: None   Collection Time: 06/20/21  8:14 AM   Specimen: Nasopharyngeal Swab; Nasopharyngeal(NP) swabs in vial transport medium  Result Value Ref Range Status   SARS Coronavirus 2 by RT PCR NEGATIVE NEGATIVE Final    Comment: (NOTE) SARS-CoV-2 target nucleic acids are NOT DETECTED.  The SARS-CoV-2 RNA is generally detectable in upper respiratory specimens during the acute phase of infection. The lowest concentration of SARS-CoV-2 viral copies this assay can detect is 138 copies/mL. A negative result does not preclude SARS-Cov-2 infection and should not be used as the sole basis for treatment or other patient management decisions. A negative result may occur with  improper specimen collection/handling, submission of specimen other than nasopharyngeal swab, presence of viral mutation(s) within the areas targeted by this assay, and inadequate number of viral copies(<138 copies/mL). A negative result must be combined  with clinical observations, patient history, and epidemiological information. The expected result is Negative.  Fact Sheet for Patients:  EntrepreneurPulse.com.au  Fact Sheet for Healthcare Providers:  IncredibleEmployment.be  This test is no t yet approved or cleared by the [place] FDA and  has been authorized for detection and/or diagnosis of SARS-CoV-2 by FDA under an Emergency Use Authorization (EUA). This EUA will remain  in effect (meaning this test can be used) for the duration of the COVID-19 declaration under Section 564(b)(1) of the Act, 21 U.S.C.section 360bbb-3(b)(1), unless the authorization is terminated  or revoked sooner.       Influenza A by PCR NEGATIVE NEGATIVE Final   Influenza B by PCR NEGATIVE NEGATIVE Final    Comment: (NOTE) The Xpert Xpress SARS-CoV-2/FLU/RSV plus assay is intended as an aid in the diagnosis of influenza from Nasopharyngeal swab specimens and should not be used as a sole basis for treatment. Nasal washings and aspirates are unacceptable for Xpert Xpress SARS-CoV-2/FLU/RSV testing.  Fact Sheet for Patients: EntrepreneurPulse.com.au  Fact Sheet for Healthcare Providers: IncredibleEmployment.be  This test is not yet approved or cleared by the [place]  FDA and has been authorized for detection and/or diagnosis of SARS-CoV-2 by FDA under an Emergency Use Authorization (EUA). This EUA will remain in effect (meaning this test can be used) for the duration of the COVID-19 declaration under Section 564(b)(1) of the Act, 21 U.S.C. section 360bbb-3(b)(1), unless the authorization is terminated or revoked.  Performed at [facility], [address] [place], [place] [address]   Culture, blood (Routine x 2)     Status: None   Collection Time: 06/20/21  8:16 AM   Specimen: BLOOD  Result Value Ref Range Status   Specimen Description BLOOD LEFT [facility]   Final   Special Requests   Final    BOTTLES DRAWN AEROBIC AND ANAEROBIC Blood Culture adequate volume   Culture   Final    NO GROWTH 5 DAYS Performed at [facility], [address] [place], [place] [address]    Report Status 06/25/2021 FINAL  Final  MRSA Next Gen by PCR, Nasal     Status: Abnormal   Collection Time: 06/20/21  9:08 PM   Specimen: Nasal Mucosa; Nasal Swab  Result Value Ref Range Status   MRSA by PCR Next Gen DETECTED (A) NOT DETECTED Final    Comment: RESULT CALLED TO, READ BACK BY AND VERIFIED WITH: NALLELY SOSA@2250  06/20/21 RH (NOTE) The GeneXpert MRSA Assay (FDA approved for NASAL specimens only), is one component of a comprehensive MRSA colonization surveillance program. It is not intended to diagnose MRSA infection nor to guide or monitor treatment for MRSA infections. Test performance is not FDA approved in patients less than 64 years old. Performed at [facility], [address] [place], [place] [address]   KOH prep     Status: None   Collection Time: 06/21/21  1:30 PM   Specimen: Esophagus  Result Value Ref Range Status   Specimen Description ESOPHAGUS  Final   Special Requests NONE  Final   KOH Prep   Final    YEAST WITH PSEUDOHYPHAE Performed at [facility], [address] [place], [place] [address]    Report Status 06/21/2021 FINAL  Final     Labs: BNP (last 3 results) Recent Labs    08/18/20 1159 01/02/21 0534 06/20/21 1049  BNP 720.6* 87.9 563.8*   Basic Metabolic Panel: Recent Labs  Lab 06/20/21 0811 06/21/21 0230 06/22/21 0457 06/23/21 0551 06/26/21 0716  NA 136  --  135 137  --   K 3.6  --  3.3* 3.8  --   CL 107  --  106 108  --   CO2 23  --  24 24  --   GLUCOSE 122*  --  93 100*  --   BUN 14  --  17 12  --   CREATININE 0.71 0.85 0.83 0.63 0.76  CALCIUM 8.6*  --  8.1* 8.0*  --    Liver Function Tests: Recent Labs  Lab 06/20/21 0811  AST 14*  ALT 8  ALKPHOS 49  BILITOT 1.8*  PROT  6.6  ALBUMIN 2.8*   No results for input(s): LIPASE, AMYLASE in the last 168 hours. No results for input(s): AMMONIA in the last 168 hours. CBC: Recent Labs  Lab 06/20/21 0811 06/20/21 1049 06/22/21 0457 06/23/21 0551 06/24/21 0831 06/25/21 0807 06/26/21 0726  WBC 14.2*   < > 8.5 5.6 5.0 5.6 5.8  NEUTROABS 12.9*  --   --  4.2 3.4 4.2 4.2  HGB 4.0*   < > 7.9* 8.1* 8.9* 8.7*  8.7*  HCT 14.9*   < > 25.0* 25.9* 30.0* 28.8* 29.4*  MCV 66.8*   < > 75.1* 76.9* 78.1* 78.3* 78.4*  PLT 282   < > 166 177 186 197 220   < > = values in this interval not displayed.   Cardiac Enzymes: No results for input(s): CKTOTAL, CKMB, CKMBINDEX, TROPONINI in the last 168 hours. BNP: Invalid input(s): POCBNP CBG: Recent Labs  Lab 06/25/21 1201 06/25/21 1701 06/25/21 2119 06/26/21 0839 06/26/21 1143  GLUCAP 120* 145* 124* 94 114*   D-Dimer No results for input(s): DDIMER in the last 72 hours. Hgb A1c No results for input(s): HGBA1C in the last 72 hours. Lipid Profile No results for input(s): CHOL, HDL, LDLCALC, TRIG, CHOLHDL, LDLDIRECT in the last 72 hours. Thyroid function studies No results for input(s): TSH, T4TOTAL, T3FREE, THYROIDAB in the last 72 hours.  Invalid input(s): FREET3 Anemia work up No results for input(s): VITAMINB12, FOLATE, FERRITIN, TIBC, IRON, RETICCTPCT in the last 72 hours. Urinalysis No results found for: COLORURINE, APPEARANCEUR, [place], [place], [place], [place], [place], [place], [place], UROBILINOGEN, NITRITE, LEUKOCYTESUR Sepsis Labs Invalid input(s): PROCALCITONIN,  WBC,  LACTICIDVEN Microbiology Recent Results (from the past 240 hour(s))  Culture, blood (Routine x 2)     Status: None   Collection Time: 06/20/21  8:11 AM   Specimen: BLOOD  Result Value Ref Range Status   Specimen Description BLOOD RIGHT HAND  Final   Special Requests   Final    BOTTLES DRAWN AEROBIC AND ANAEROBIC Blood Culture adequate volume   Culture   Final    NO GROWTH 5  DAYS Performed at [facility], [address] [place], [place] [address]    Report Status 06/25/2021 FINAL  Final  Resp Panel by RT-PCR (Flu A&B, Covid) Nasopharyngeal Swab     Status: None   Collection Time: 06/20/21  8:14 AM   Specimen: Nasopharyngeal Swab; Nasopharyngeal(NP) swabs in vial transport medium  Result Value Ref Range Status   SARS Coronavirus 2 by RT PCR NEGATIVE NEGATIVE Final    Comment: (NOTE) SARS-CoV-2 target nucleic acids are NOT DETECTED.  The SARS-CoV-2 RNA is generally detectable in upper respiratory specimens during the acute phase of infection. The lowest concentration of SARS-CoV-2 viral copies this assay can detect is 138 copies/mL. A negative result does not preclude SARS-Cov-2 infection and should not be used as the sole basis for treatment or other patient management decisions. A negative result may occur with  improper specimen collection/handling, submission of specimen other than nasopharyngeal swab, presence of viral mutation(s) within the areas targeted by this assay, and inadequate number of viral copies(<138 copies/mL). A negative result must be combined with clinical observations, patient history, and epidemiological information. The expected result is Negative.  Fact Sheet for Patients:  EntrepreneurPulse.com.au  Fact Sheet for Healthcare Providers:  IncredibleEmployment.be  This test is no t yet approved or cleared by the [place] FDA and  has been authorized for detection and/or diagnosis of SARS-CoV-2 by FDA under an Emergency Use Authorization (EUA). This EUA will remain  in effect (meaning this test can be used) for the duration of the COVID-19 declaration under Section 564(b)(1) of the Act, 21 U.S.C.section 360bbb-3(b)(1), unless the authorization is terminated  or revoked sooner.       Influenza A by PCR NEGATIVE NEGATIVE Final   Influenza B by PCR NEGATIVE NEGATIVE Final     Comment: (NOTE) The Xpert Xpress SARS-CoV-2/FLU/RSV plus assay is intended as an aid in the diagnosis  of influenza from Nasopharyngeal swab specimens and should not be used as a sole basis for treatment. Nasal washings and aspirates are unacceptable for Xpert Xpress SARS-CoV-2/FLU/RSV testing.  Fact Sheet for Patients: EntrepreneurPulse.com.au  Fact Sheet for Healthcare Providers: IncredibleEmployment.be  This test is not yet approved or cleared by the [place] FDA and has been authorized for detection and/or diagnosis of SARS-CoV-2 by FDA under an Emergency Use Authorization (EUA). This EUA will remain in effect (meaning this test can be used) for the duration of the COVID-19 declaration under Section 564(b)(1) of the Act, 21 U.S.C. section 360bbb-3(b)(1), unless the authorization is terminated or revoked.  Performed at [facility], [address] [place], [place] [address]   Culture, blood (Routine x 2)     Status: None   Collection Time: 06/20/21  8:16 AM   Specimen: BLOOD  Result Value Ref Range Status   Specimen Description BLOOD LEFT [facility]  Final   Special Requests   Final    BOTTLES DRAWN AEROBIC AND ANAEROBIC Blood Culture adequate volume   Culture   Final    NO GROWTH 5 DAYS Performed at [facility], [address], [place], [place] [address]    Report Status 06/25/2021 FINAL  Final  MRSA Next Gen by PCR, Nasal     Status: Abnormal   Collection Time: 06/20/21  9:08 PM   Specimen: Nasal Mucosa; Nasal Swab  Result Value Ref Range Status   MRSA by PCR Next Gen DETECTED (A) NOT DETECTED Final    Comment: RESULT CALLED TO, READ BACK BY AND VERIFIED WITH: NALLELY SOSA@2250  06/20/21 RH (NOTE) The GeneXpert MRSA Assay (FDA approved for NASAL specimens only), is one component of a comprehensive MRSA colonization surveillance program. It is not intended to diagnose MRSA infection nor to guide or monitor  treatment for MRSA infections. Test performance is not FDA approved in patients less than 20 years old. Performed at [facility], [address] [place], [place] [address]   KOH prep     Status: None   Collection Time: 06/21/21  1:30 PM   Specimen: Esophagus  Result Value Ref Range Status   Specimen Description ESOPHAGUS  Final   Special Requests NONE  Final   KOH Prep   Final    YEAST WITH PSEUDOHYPHAE Performed at [facility], [address] [place], [place] [address]    Report Status 06/21/2021 FINAL  Final     Time coordinating discharge: Over 30 minutes  SIGNED:   Sidney Ace, MD  [facility] 06/26/2021, 1:27 PM Pager   If 7PM-7AM, please contact night-coverage

## 2021-06-26 NOTE — TOC Transition Note (Signed)
Transition of Care [facility]) - CM/SW Discharge Note   Patient Details  Name: Courtney Mack MRN: [id number] Date of Birth: [birth year]  Transition of Care [facility]) CM/SW Contact:  Beverly Sessions, RN Phone Number: 06/26/2021, 8:55 AM   Clinical Narrative:    Patient to discharge today Confirmed with MD and bedside RN that there is not any wound care that needs to be addressed in the home  [facility] with [place] notified of discharge.   Daughter to transport at discharge    Final next level of care: [place] Barriers to Discharge: Barriers Resolved   Patient Goals and CMS Choice        Discharge Placement                       Discharge Plan and Services                          HH Arranged: PT, OT [facility] Agency: [place] ([place]) Date [facility] Agency Contacted: 06/26/21   Representative spoke with at [place] Level: [place] ([place]) Interventions     Readmission Risk Interventions Readmission Risk Prevention Plan 06/23/2021  Transportation Screening Complete  PCP or Specialist Appt within 3-5 Days Complete  Social Work Consult for [place] Planning/Counseling [place] Not Applicable  Medication Review Press photographer) Complete  Some recent data might be hidden

## 2021-06-26 NOTE — Plan of Care (Signed)
VSS, patient alert and oriented.  Discharge instructions reviewed, skin care strategies reviewed and patient discharged to home with daughter

## 2021-07-18 ENCOUNTER — Encounter
Admission: EM | Disposition: A | Discharge status: Home Health | Payer: Self-pay | Source: Home / Self Care | Attending: Internal Medicine

## 2021-07-18 ENCOUNTER — Emergency Department: Payer: Medicare (Managed Care)

## 2021-07-18 ENCOUNTER — Other Ambulatory Visit: Payer: Self-pay | Admitting: Radiology

## 2021-07-18 ENCOUNTER — Inpatient Hospital Stay
Admission: EM | Admit: 2021-07-18 | Discharge: 2021-07-23 | DRG: 982 | Disposition: A | Discharge status: Home Health | Payer: Medicare (Managed Care) | Attending: Internal Medicine | Admitting: Internal Medicine

## 2021-07-18 ENCOUNTER — Encounter: Payer: Self-pay | Admitting: Emergency Medicine

## 2021-07-18 ENCOUNTER — Other Ambulatory Visit: Payer: Self-pay

## 2021-07-18 ENCOUNTER — Inpatient Hospital Stay: Payer: Medicare (Managed Care)

## 2021-07-18 DIAGNOSIS — Z7984 Long term (current) use of oral hypoglycemic drugs: Secondary | ICD-10-CM

## 2021-07-18 DIAGNOSIS — Z8542 Personal history of malignant neoplasm of other parts of uterus: Secondary | ICD-10-CM | POA: Diagnosis not present

## 2021-07-18 DIAGNOSIS — Z825 Family history of asthma and other chronic lower respiratory diseases: Secondary | ICD-10-CM | POA: Diagnosis not present

## 2021-07-18 DIAGNOSIS — I82409 Acute embolism and thrombosis of unspecified deep veins of unspecified lower extremity: Secondary | ICD-10-CM

## 2021-07-18 DIAGNOSIS — Z6841 Body Mass Index (BMI) 40.0 and over, adult: Secondary | ICD-10-CM

## 2021-07-18 DIAGNOSIS — Z8249 Family history of ischemic heart disease and other diseases of the circulatory system: Secondary | ICD-10-CM

## 2021-07-18 DIAGNOSIS — Z818 Family history of other mental and behavioral disorders: Secondary | ICD-10-CM

## 2021-07-18 DIAGNOSIS — Z7901 Long term (current) use of anticoagulants: Secondary | ICD-10-CM

## 2021-07-18 DIAGNOSIS — Z95828 Presence of other vascular implants and grafts: Secondary | ICD-10-CM | POA: Diagnosis not present

## 2021-07-18 DIAGNOSIS — Z7951 Long term (current) use of inhaled steroids: Secondary | ICD-10-CM | POA: Diagnosis not present

## 2021-07-18 DIAGNOSIS — R000 Tachycardia, unspecified: Secondary | ICD-10-CM | POA: Diagnosis present

## 2021-07-18 DIAGNOSIS — E7800 Pure hypercholesterolemia, unspecified: Secondary | ICD-10-CM | POA: Diagnosis present

## 2021-07-18 DIAGNOSIS — T45515A Adverse effect of anticoagulants, initial encounter: Secondary | ICD-10-CM | POA: Diagnosis present

## 2021-07-18 DIAGNOSIS — Z86718 Personal history of other venous thrombosis and embolism: Secondary | ICD-10-CM

## 2021-07-18 DIAGNOSIS — K922 Gastrointestinal hemorrhage, unspecified: Secondary | ICD-10-CM

## 2021-07-18 DIAGNOSIS — D62 Acute posthemorrhagic anemia: Secondary | ICD-10-CM | POA: Diagnosis present

## 2021-07-18 DIAGNOSIS — Z83438 Family history of other disorder of lipoprotein metabolism and other lipidemia: Secondary | ICD-10-CM | POA: Diagnosis not present

## 2021-07-18 DIAGNOSIS — I2699 Other pulmonary embolism without acute cor pulmonale: Secondary | ICD-10-CM | POA: Diagnosis not present

## 2021-07-18 DIAGNOSIS — Z803 Family history of malignant neoplasm of breast: Secondary | ICD-10-CM

## 2021-07-18 DIAGNOSIS — Z20822 Contact with and (suspected) exposure to covid-19: Secondary | ICD-10-CM | POA: Diagnosis present

## 2021-07-18 DIAGNOSIS — Z833 Family history of diabetes mellitus: Secondary | ICD-10-CM

## 2021-07-18 DIAGNOSIS — Z86711 Personal history of pulmonary embolism: Secondary | ICD-10-CM | POA: Diagnosis not present

## 2021-07-18 DIAGNOSIS — R531 Weakness: Secondary | ICD-10-CM

## 2021-07-18 DIAGNOSIS — K921 Melena: Secondary | ICD-10-CM | POA: Diagnosis present

## 2021-07-18 DIAGNOSIS — I110 Hypertensive heart disease with heart failure: Secondary | ICD-10-CM | POA: Diagnosis present

## 2021-07-18 DIAGNOSIS — I5032 Chronic diastolic (congestive) heart failure: Secondary | ICD-10-CM | POA: Diagnosis present

## 2021-07-18 DIAGNOSIS — Z87891 Personal history of nicotine dependence: Secondary | ICD-10-CM | POA: Diagnosis not present

## 2021-07-18 DIAGNOSIS — D6832 Hemorrhagic disorder due to extrinsic circulating anticoagulants: Secondary | ICD-10-CM | POA: Diagnosis present

## 2021-07-18 DIAGNOSIS — E119 Type 2 diabetes mellitus without complications: Secondary | ICD-10-CM | POA: Diagnosis present

## 2021-07-18 DIAGNOSIS — D649 Anemia, unspecified: Secondary | ICD-10-CM

## 2021-07-18 DIAGNOSIS — Z79899 Other long term (current) drug therapy: Secondary | ICD-10-CM

## 2021-07-18 DIAGNOSIS — Z821 Family history of blindness and visual loss: Secondary | ICD-10-CM

## 2021-07-18 HISTORY — PX: VISCERAL ANGIOGRAPHY: CATH118276

## 2021-07-18 LAB — CBC
HCT: 17.3 % — ABNORMAL LOW (ref 36.0–46.0)
HCT: 19.6 % — ABNORMAL LOW (ref 36.0–46.0)
Hemoglobin: 4.8 g/dL — CL (ref 12.0–15.0)
Hemoglobin: 5.9 g/dL — ABNORMAL LOW (ref 12.0–15.0)
MCH: 23.4 pg — ABNORMAL LOW (ref 26.0–34.0)
MCH: 24.8 pg — ABNORMAL LOW (ref 26.0–34.0)
MCHC: 27.7 g/dL — ABNORMAL LOW (ref 30.0–36.0)
MCHC: 30.1 g/dL (ref 30.0–36.0)
MCV: 84.4 fL (ref 80.0–100.0)
MCV: 82.4 fL (ref 80.0–100.0)
Platelets: 269 10*3/uL (ref 150–400)
Platelets: 208 10*3/uL (ref 150–400)
RBC: 2.05 MIL/uL — ABNORMAL LOW (ref 3.87–5.11)
RBC: 2.38 MIL/uL — ABNORMAL LOW (ref 3.87–5.11)
RDW: 26.4 % — ABNORMAL HIGH (ref 11.5–15.5)
RDW: 21.2 % — ABNORMAL HIGH (ref 11.5–15.5)
WBC: 5.7 10*3/uL (ref 4.0–10.5)
WBC: 5.3 10*3/uL (ref 4.0–10.5)
nRBC: 0.5 % — ABNORMAL HIGH (ref 0.0–0.2)
nRBC: 0.6 % — ABNORMAL HIGH (ref 0.0–0.2)

## 2021-07-18 LAB — BASIC METABOLIC PANEL
Anion gap: 6 (ref 5–15)
BUN: 26 mg/dL — ABNORMAL HIGH (ref 8–23)
CO2: 26 mmol/L (ref 22–32)
Calcium: 9 mg/dL (ref 8.9–10.3)
Chloride: 104 mmol/L (ref 98–111)
Creatinine, Ser: 0.69 mg/dL (ref 0.44–1.00)
GFR, Estimated: >60 mL/min (ref >60)
Glucose, Bld: 99 mg/dL (ref 70–99)
Potassium: 3.7 mmol/L (ref 3.5–5.1)
Sodium: 136 mmol/L (ref 135–145)

## 2021-07-18 LAB — URINALYSIS, ROUTINE W REFLEX MICROSCOPIC
Bilirubin Urine: NEGATIVE
Glucose, UA: NEGATIVE mg/dL
Hgb urine dipstick: NEGATIVE
Ketones, ur: 20 mg/dL — AB
Leukocytes,Ua: NEGATIVE
Nitrite: NEGATIVE
Protein, ur: NEGATIVE mg/dL
Specific Gravity, Urine: 1.033 — ABNORMAL HIGH (ref 1.005–1.030)
pH: 5 (ref 5.0–8.0)

## 2021-07-18 LAB — CBG MONITORING, ED
Glucose-Capillary: 96 mg/dL (ref 70–99)
Glucose-Capillary: 89 mg/dL (ref 70–99)

## 2021-07-18 LAB — TROPONIN I (HIGH SENSITIVITY)
Troponin I (High Sensitivity): 8 ng/L (ref <18)
Troponin I (High Sensitivity): 4 ng/L (ref <18)

## 2021-07-18 LAB — PREPARE RBC (CROSSMATCH)

## 2021-07-18 LAB — RESP PANEL BY RT-PCR (FLU A&B, COVID) ARPGX2
Influenza A by PCR: NEGATIVE
Influenza B by PCR: NEGATIVE
SARS Coronavirus 2 by RT PCR: NEGATIVE

## 2021-07-18 LAB — BRAIN NATRIURETIC PEPTIDE: B Natriuretic Peptide: 95.9 pg/mL (ref 0.0–100.0)

## 2021-07-18 LAB — GLUCOSE, CAPILLARY: Glucose-Capillary: 111 mg/dL — ABNORMAL HIGH (ref 70–99)

## 2021-07-18 SURGERY — VISCERAL ANGIOGRAPHY
Anesthesia: Moderate Sedation

## 2021-07-18 MED ORDER — PANTOPRAZOLE SODIUM 40 MG IV SOLR
40.0000 mg | Freq: Two times a day (BID) | INTRAVENOUS | Status: DC
  Administered 2021-07-18 – 2021-07-23 (×11): 40 mg via INTRAVENOUS
  Filled 2021-07-18 (×12): qty 40

## 2021-07-18 MED ORDER — DICYCLOMINE HCL 20 MG PO TABS
20.0000 mg | ORAL_TABLET | Freq: Three times a day (TID) | ORAL | Status: DC
  Administered 2021-07-18 – 2021-07-23 (×14): 20 mg via ORAL
  Filled 2021-07-18 (×18): qty 1

## 2021-07-18 MED ORDER — HYDRALAZINE HCL 20 MG/ML IJ SOLN: 10.0000 mg | INTRAMUSCULAR | Status: DC | PRN

## 2021-07-18 MED ORDER — MIDAZOLAM HCL 2 MG/2ML IJ SOLN
INTRAMUSCULAR | Status: DC | PRN
  Administered 2021-07-18: 2 mg via INTRAVENOUS

## 2021-07-18 MED ORDER — PROTHROMBIN COMPLEX CONC HUMAN 1000 UNITS IV KIT
5000.0000 [IU] | PACK | Status: AC
  Administered 2021-07-18: 5000 [IU] via INTRAVENOUS
  Filled 2021-07-18: qty 5000

## 2021-07-18 MED ORDER — SODIUM CHLORIDE 0.9% IV SOLUTION
Freq: Once | INTRAVENOUS | Status: AC
  Filled 2021-07-18: qty 250

## 2021-07-18 MED ORDER — ACETAMINOPHEN 325 MG PO TABS
650.0000 mg | ORAL_TABLET | Freq: Four times a day (QID) | ORAL | Status: DC | PRN
  Administered 2021-07-22: 650 mg via ORAL
  Filled 2021-07-18: qty 2

## 2021-07-18 MED ORDER — MOMETASONE FURO-FORMOTEROL FUM 100-5 MCG/ACT IN AERO
2.0000 | INHALATION_SPRAY | Freq: Two times a day (BID) | RESPIRATORY_TRACT | Status: DC
  Administered 2021-07-18 – 2021-07-23 (×10): 2 via RESPIRATORY_TRACT
  Filled 2021-07-18 (×2): qty 8.8

## 2021-07-18 MED ORDER — FENTANYL CITRATE PF 50 MCG/ML IJ SOSY
PREFILLED_SYRINGE | INTRAMUSCULAR | Status: AC
  Filled 2021-07-18: qty 1

## 2021-07-18 MED ORDER — SODIUM CHLORIDE 0.9 % IV SOLN: INTRAVENOUS | Status: DC

## 2021-07-18 MED ORDER — DOCUSATE SODIUM 100 MG PO CAPS: 100.0000 mg | ORAL_CAPSULE | Freq: Two times a day (BID) | ORAL | Status: DC | PRN

## 2021-07-18 MED ORDER — DIPHENHYDRAMINE HCL 25 MG PO CAPS: 25.0000 mg | ORAL_CAPSULE | Freq: Four times a day (QID) | ORAL | Status: DC | PRN

## 2021-07-18 MED ORDER — POLYETHYLENE GLYCOL 3350 17 G PO PACK: 17.0000 g | PACK | Freq: Every day | ORAL | Status: DC | PRN

## 2021-07-18 MED ORDER — SODIUM CHLORIDE 0.9 % IV BOLUS
1000.0000 mL | Freq: Once | INTRAVENOUS | Status: AC
  Administered 2021-07-18: 1000 mL via INTRAVENOUS

## 2021-07-18 MED ORDER — FENTANYL CITRATE (PF) 100 MCG/2ML IJ SOLN
INTRAMUSCULAR | Status: DC | PRN
  Administered 2021-07-18: 50 ug via INTRAVENOUS

## 2021-07-18 MED ORDER — SODIUM CHLORIDE 0.9 % IV SOLN: 10.0000 mL/h | Freq: Once | INTRAVENOUS | Status: DC

## 2021-07-18 MED ORDER — CEFAZOLIN SODIUM-DEXTROSE 2-4 GM/100ML-% IV SOLN
2.0000 g | Freq: Once | INTRAVENOUS | Status: AC
  Administered 2021-07-18: 2 g via INTRAVENOUS

## 2021-07-18 MED ORDER — ONDANSETRON HCL 4 MG/2ML IJ SOLN
4.0000 mg | Freq: Four times a day (QID) | INTRAMUSCULAR | Status: DC | PRN
  Administered 2021-07-19: 4 mg via INTRAVENOUS
  Filled 2021-07-18: qty 2

## 2021-07-18 MED ORDER — SIMETHICONE 80 MG PO CHEW
80.0000 mg | CHEWABLE_TABLET | Freq: Four times a day (QID) | ORAL | Status: DC
  Administered 2021-07-18 – 2021-07-23 (×18): 80 mg via ORAL
  Filled 2021-07-18 (×21): qty 1

## 2021-07-18 MED ORDER — MIDAZOLAM HCL 2 MG/2ML IJ SOLN
INTRAMUSCULAR | Status: AC
  Filled 2021-07-18: qty 2

## 2021-07-18 MED ORDER — TECHNETIUM TC 99M-LABELED RED BLOOD CELLS IV KIT
20.0000 | PACK | Freq: Once | INTRAVENOUS | Status: AC
  Administered 2021-07-18: 21.33 via INTRAVENOUS

## 2021-07-18 SURGICAL SUPPLY — 14 items
BLOCK BEAD 500-700 (Vascular Products) ×1 IMPLANT
CATH ANGIO 5F PIGTAIL 65CM (CATHETERS) ×1 IMPLANT
CATH MICROCATH PRGRT 2.8F 110 (CATHETERS) IMPLANT
CATH VS15FR (CATHETERS) ×1 IMPLANT
COVER PROBE U/S 5X48 (MISCELLANEOUS) ×1 IMPLANT
DEVICE STARCLOSE SE CLOSURE (Vascular Products) ×1 IMPLANT
DEVICE TORQUE (MISCELLANEOUS) ×1 IMPLANT
GLIDEWIRE STIFF .35X180X3 HYDR (WIRE) ×1 IMPLANT
MICROCATH PROGREAT 2.8F 110 CM (CATHETERS) ×2
PACK ANGIOGRAPHY (CUSTOM PROCEDURE TRAY) ×2 IMPLANT
SHEATH BRITE TIP 5FRX11 (SHEATH) ×1 IMPLANT
SYR MEDRAD MARK 7 150ML (SYRINGE) ×1 IMPLANT
TUBING CONTRAST HIGH PRESS 72 (TUBING) ×1 IMPLANT
WIRE GUIDERIGHT .035X150 (WIRE) ×1 IMPLANT

## 2021-07-18 NOTE — ED Notes (Signed)
Pt transported to Nuclear Medicine  

## 2021-07-18 NOTE — H&P (Signed)
History and Physical    Courtney Mack:[id number] DOB: 03-May-1956 DOA: 07/18/2021  PCP: [facility] [place]   Patient coming from: weakness  I have personally briefly reviewed patient's relevant medical records in [place]  Chief Complaint: shortness of breath  HPI: Courtney Mack is a 66 y.o. female with medical history significant for  DVT/PE on Xarelto, hypertension, hyperlipidemia, diabetes mellitus, anemia, obesity, endometrial cancer, dCHF, morbid obesity, recently hospitalized from 12/14-12/20 with acute blood loss anemia requiring 4 units PRBC with negative upper and lower GI as well as negative capsule endoscopy, restarted on Xarelto at discharge who presents again to the emergency room with 2-day history of tarry stools associated with weakness and shortness of breath  ED course: On arrival afebrile, pulse 100, respirations 20 with BP 115/58 Blood work with hemoglobin 4.8 otherwise unremarkable COVID and flu negative  EKG, personally reviewed and interpreted: Sinus tachycardia at 101 with no acute ST-T wave changes  Imaging: No acute disease  Patient started on PRBCs.  Hospitalist consulted for admission.  Review of Systems: As per HPI otherwise all other systems on review of systems negative.   Assessment/Plan Active Problems:   * No active hospital problems. *  GI bleeding, acute blood loss anemia and symptomatic anemia:  -NM bleeding scan -[place] - hold Xarelto - transfuse 2 units of blood now  - Vascular consulted   Hx of Pulmonary embolism and DVT:  -Pt is s/p of IVC filter on 08/18/20. C -Consider permanent discontinuation of anticoagulation   Diabetes mellitus without complication ([place]):  -Sliding scale insulin coverage   HTN: -hold Cozaar since patient is at high risk of developing hypotension due to severe anemia -IV hydralazine as needed   Chronic diastolic CHF (congestive heart failure) ([place]):  -2D echo on  08/19/2020 showed EF 60 to 65% with grade 1 diastolic dysfunction.   -Chest x-ray is negative for pulmonary edema, does not seem to have CHF exacerbation. -W\ill give as needed Lasix while patient is getting blood transfusion    DVT prophylaxis: SCD Code Status: full code  Family Communication:  none  Disposition Plan: Back to previous home environment Consults called: vascular  Status:At the time of admission, it appears that the appropriate admission status for this patient is INPATIENT. This is judged to be reasonable and necessary in order to provide the required intensity of service to ensure the patient's safety given the presenting symptoms, physical exam findings, and initial radiographic and laboratory data in the context of their  Comorbid conditions.   Patient requires inpatient status due to high intensity of service, high risk for further deterioration and high frequency of surveillance required.   I certify that at the point of admission it is my clinical judgment that the patient will require inpatient hospital care spanning beyond 2 midnights     Physical Exam: Vitals:   07/18/21 0157 07/18/21 0300 07/18/21 0400 07/18/21 0403  BP: (!) 115/58 (!) 114/47 113/61   Pulse: 100 (!) 102 99   Resp: (!) 24 (!) 23  18  Temp: 98 F (36.7 C)   98.2 F (36.8 C)  TempSrc: Oral   Oral  SpO2: 99% 98% 95%   Weight:      Height:       Constitutional: Alert, oriented x 3 . Not in any apparent distress HEENT:      Head: Normocephalic and atraumatic.         Eyes: PERLA, EOMI, Conjunctivae are normal. Sclera  is non-icteric.       Mouth/Throat: pale       Neck: Supple with no signs of meningismus. Cardiovascular: tachycardic. No murmurs, gallops, or rubs. 2+ symmetrical distal pulses are present . No JVD. No  LE edema Respiratory: Respiratory effort normal .Lungs sounds clear bilaterally. No wheezes, crackles, or rhonchi.  Gastrointestinal: Soft, non tender, non distended. Positive  bowel sounds.  Genitourinary: No CVA tenderness. Musculoskeletal: Nontender with normal range of motion in all extremities. No cyanosis, or erythema of extremities. Neurologic:  Face is symmetric. Moving all extremities. No gross focal neurologic deficits . Skin: Skin is warm, dry.  No rash or ulcers Psychiatric: Mood and affect are appropriate     Past Medical History:  Diagnosis Date   Diabetes mellitus without complication ([place])    DVT (deep venous thrombosis) ([place]) 09/06/2020   High cholesterol    Hx of blood clots    Hypertension    IDA (iron deficiency anemia) 09/21/2020    Past Surgical History:  Procedure Laterality Date   COLONOSCOPY N/A 06/21/2021   Procedure: COLONOSCOPY;  Surgeon: Toledo, Benay Pike, MD;  Location: [facility] ENDOSCOPY;  Service: Gastroenterology;  Laterality: N/A;   ESOPHAGOGASTRODUODENOSCOPY N/A 06/21/2021   Procedure: ESOPHAGOGASTRODUODENOSCOPY (EGD);  Surgeon: Toledo, Benay Pike, MD;  Location: [facility] ENDOSCOPY;  Service: Gastroenterology;  Laterality: N/A;   GIVENS CAPSULE STUDY N/A 06/22/2021   Procedure: GIVENS CAPSULE STUDY;  Surgeon: Toledo, Benay Pike, MD;  Location: [facility] ENDOSCOPY;  Service: Gastroenterology;  Laterality: N/A;   IVC FILTER INSERTION N/A 08/18/2020   Procedure: IVC FILTER INSERTION;  Surgeon: Katha Cabal, MD;  Location: [place] CV LAB;  Service: Cardiovascular;  Laterality: N/A;   PERIPHERAL VASCULAR THROMBECTOMY Bilateral 01/03/2021   Procedure: PERIPHERAL VASCULAR THROMBECTOMY;  Surgeon: Algernon Huxley, MD;  Location: [place] CV LAB;  Service: Cardiovascular;  Laterality: Bilateral;   PULMONARY THROMBECTOMY N/A 08/18/2020   Procedure: PULMONARY THROMBECTOMY;  Surgeon: Katha Cabal, MD;  Location: [place] CV LAB;  Service: Cardiovascular;  Laterality: N/A;   TUBAL LIGATION       reports that she quit smoking about 40 years ago. Her smoking use included cigarettes. She has a 0.13 pack-year smoking history. She has  never used smokeless tobacco. She reports that she does not currently use alcohol. She reports that she does not currently use drugs.  No Known Allergies  Family History  Problem Relation Age of Onset   Hypertension Mother    Diabetes Mother    Diabetes Father    Hypertension Father    High Cholesterol Father    Congestive Heart Failure Father    Breast cancer Cousin       Prior to Admission medications   Medication Sig Start Date End Date Taking? Authorizing Provider  dicyclomine (BENTYL) 20 MG tablet Take 1 tablet (20 mg total) by mouth 3 (three) times daily before meals for 10 days. 06/26/21 07/06/21  Sidney Ace, MD  DULERA 100-5 MCG/ACT AERO Inhale 2 puffs into the lungs 2 (two) times daily. 11/22/20   [provider]  famotidine (PEPCID) 20 MG tablet Take 20 mg by mouth 2 (two) times daily as needed. 05/24/21   [provider]  loperamide (IMODIUM) 2 MG capsule Take 1 capsule (2 mg total) by mouth as needed for diarrhea or loose stools. 06/26/21   Sidney Ace, MD  losartan (COZAAR) 25 MG tablet Take 1 tablet (25 mg total) by mouth daily. 10/03/20   Earlie Server, MD  metFORMIN (GLUCOPHAGE) 500 MG tablet Take 1 tablet (500 mg total) by mouth 2 (two) times daily with a meal. 10/03/20   Earlie Server, MD  Multiple Vitamin (MULTIVITAMIN WITH MINERALS) TABS tablet Take 1 tablet by mouth daily. 09/12/20   [name], MD  pantoprazole (PROTONIX) 40 MG tablet Take 1 tablet (40 mg total) by mouth 2 (two) times daily. 06/26/21 07/26/21  Sidney Ace, MD  rivaroxaban (XARELTO) 20 MG TABS tablet Take 1 tablet (20 mg total) by mouth daily with supper. 01/25/21   Earlie Server, MD  rosuvastatin (CRESTOR) 10 MG tablet Take 1 tablet (10 mg total) by mouth daily. 10/03/20   Earlie Server, MD  simethicone (MYLICON) 80 MG chewable tablet Chew 1 tablet (80 mg total) by mouth 4 (four) times daily. 06/26/21   Sidney Ace, MD      Labs on Admission: I have personally  reviewed following labs and imaging studies  CBC: Recent Labs  Lab 07/18/21 0201  WBC 5.7  HGB 4.8*  HCT 17.3*  MCV 84.4  PLT 233   Basic Metabolic Panel: Recent Labs  Lab 07/18/21 0201  NA 136  K 3.7  CL 104  CO2 26  GLUCOSE 99  BUN 26*  CREATININE 0.69  CALCIUM 9.0   GFR: Estimated Creatinine Clearance: 76.2 mL/min (by C-G formula based on SCr of 0.69 mg/dL). Liver Function Tests: No results for input(s): AST, ALT, ALKPHOS, BILITOT, PROT, ALBUMIN in the last 168 hours. No results for input(s): LIPASE, AMYLASE in the last 168 hours. No results for input(s): AMMONIA in the last 168 hours. Coagulation Profile: No results for input(s): INR, PROTIME in the last 168 hours. Cardiac Enzymes: No results for input(s): CKTOTAL, CKMB, CKMBINDEX, TROPONINI in the last 168 hours. BNP (last 3 results) No results for input(s): PROBNP in the last 8760 hours. HbA1C: No results for input(s): HGBA1C in the last 72 hours. CBG: Recent Labs  Lab 07/18/21 0245  GLUCAP 96   Lipid Profile: No results for input(s): CHOL, HDL, LDLCALC, TRIG, CHOLHDL, LDLDIRECT in the last 72 hours. Thyroid Function Tests: No results for input(s): TSH, T4TOTAL, FREET4, T3FREE, THYROIDAB in the last 72 hours. Anemia Panel: No results for input(s): VITAMINB12, FOLATE, FERRITIN, TIBC, IRON, RETICCTPCT in the last 72 hours. Urine analysis: No results found for: COLORURINE, APPEARANCEUR, LABSPEC, Rachel, GLUCOSEU, HGBUR, BILIRUBINUR, KETONESUR, PROTEINUR, UROBILINOGEN, NITRITE, LEUKOCYTESUR  Radiological Exams on Admission: DG Chest 2 View  Result Date: 07/18/2021 CLINICAL DATA:  Shortness of breath. EXAM: CHEST - 2 VIEW COMPARISON:  Chest radiograph dated 06/20/2021 FINDINGS: No focal consolidation, pleural effusion, or pneumothorax. The cardiac silhouette is within normal limits. No acute osseous pathology. Degenerative changes of the spine. An IVC filter is noted. IMPRESSION: No active cardiopulmonary  disease. Electronically Signed   By: Anner Crete M.D.   On: 07/18/2021 02:17       Athena Masse MD [facility]   07/18/2021, 4:40 AM

## 2021-07-18 NOTE — ED Provider Notes (Signed)
[facility] Provider Note    Event Date/Time   First MD Initiated Contact with Patient 07/18/21 0240     (approximate)   History   Weakness and Shortness of Breath   HPI  Courtney Mack is a 66 y.o. female brought to the ED via EMS from home with a chief complaint of generalized weakness, lightheadedness and feels like her blood counts are low.  Patient on Xarelto for history of DVT/PE.  Admitted to the hospital last month for anemia secondary to chronic blood loss requiring 4 units transfusion.  Patient underwent endoscopy, colonoscopy and capsule study without clear source for bleeding.  She was placed back on her Xarelto at discharge.  Patient has noted black and tarry stools for 2 days.  Denies fever, cough, chest pain, shortness of breath, nausea or vomiting.     Past Medical History   Past Medical History:  Diagnosis Date   Diabetes mellitus without complication ([place])    DVT (deep venous thrombosis) ([place]) 09/06/2020   High cholesterol    Hx of blood clots    Hypertension    IDA (iron deficiency anemia) 09/21/2020     Active Problem List   Patient Active Problem List   Diagnosis Date Noted   Pressure injury of skin 06/21/2021   Symptomatic anemia 06/20/2021   Sepsis ([place]) 06/20/2021   HLD (hyperlipidemia) 06/20/2021   Fall at home, initial encounter 06/20/2021   Cough 06/20/2021   Chronic diastolic CHF (congestive heart failure) ([place]) 06/20/2021   GI bleeding 06/20/2021   Cellulitis of left lower leg 06/20/2021   Acute bilateral deep vein thrombosis (DVT) of femoral veins ([place]) 01/02/2021   DOE (dyspnea on exertion) 11/10/2020   Risk factors for obstructive sleep apnea 11/10/2020   Uterine cancer ([place]) 11/08/2020   IDA (iron deficiency anemia) 09/21/2020   Endometrial cancer ([place]) 09/13/2020   Acute blood loss anemia 09/06/2020   Vaginal bleeding 09/06/2020   DVT (deep venous thrombosis) ([place]) 09/06/2020   Acute massive pulmonary  embolism ([place]) 08/25/2020   Anemia, unspecified 08/24/2020   Morbidly obese ([place]) 08/23/2020   Essential hypertension 08/23/2020   Diabetes mellitus without complication ([place]) 28/41/3244   Dyslipidemia 08/23/2020   Acute respiratory failure with hypoxia ([place])    Pulmonary embolism ([place]) 08/18/2020   Vaginal bleeding, abnormal 08/18/2020   Obesity, morbid, BMI 50 or higher ([place]) 08/18/2020     Past Surgical History   Past Surgical History:  Procedure Laterality Date   COLONOSCOPY N/A 06/21/2021   Procedure: COLONOSCOPY;  Surgeon: Toledo, Benay Pike, MD;  Location: [facility] ENDOSCOPY;  Service: Gastroenterology;  Laterality: N/A;   ESOPHAGOGASTRODUODENOSCOPY N/A 06/21/2021   Procedure: ESOPHAGOGASTRODUODENOSCOPY (EGD);  Surgeon: Toledo, Benay Pike, MD;  Location: [facility] ENDOSCOPY;  Service: Gastroenterology;  Laterality: N/A;   GIVENS CAPSULE STUDY N/A 06/22/2021   Procedure: GIVENS CAPSULE STUDY;  Surgeon: Toledo, Benay Pike, MD;  Location: [facility] ENDOSCOPY;  Service: Gastroenterology;  Laterality: N/A;   IVC FILTER INSERTION N/A 08/18/2020   Procedure: IVC FILTER INSERTION;  Surgeon: Katha Cabal, MD;  Location: [place] CV LAB;  Service: Cardiovascular;  Laterality: N/A;   PERIPHERAL VASCULAR THROMBECTOMY Bilateral 01/03/2021   Procedure: PERIPHERAL VASCULAR THROMBECTOMY;  Surgeon: Algernon Huxley, MD;  Location: [place] CV LAB;  Service: Cardiovascular;  Laterality: Bilateral;   PULMONARY THROMBECTOMY N/A 08/18/2020   Procedure: PULMONARY THROMBECTOMY;  Surgeon: Katha Cabal, MD;  Location: [place] CV LAB;  Service: Cardiovascular;  Laterality: N/A;   TUBAL  LIGATION       Home Medications   Prior to Admission medications   Medication Sig Start Date End Date Taking? Authorizing Provider  dicyclomine (BENTYL) 20 MG tablet Take 1 tablet (20 mg total) by mouth 3 (three) times daily before meals for 10 days. 06/26/21 07/06/21  Sidney Ace, MD  DULERA 100-5  MCG/ACT AERO Inhale 2 puffs into the lungs 2 (two) times daily. 11/22/20   [provider]  famotidine (PEPCID) 20 MG tablet Take 20 mg by mouth 2 (two) times daily as needed. 05/24/21   [provider]  loperamide (IMODIUM) 2 MG capsule Take 1 capsule (2 mg total) by mouth as needed for diarrhea or loose stools. 06/26/21   Sidney Ace, MD  losartan (COZAAR) 25 MG tablet Take 1 tablet (25 mg total) by mouth daily. 10/03/20   Earlie Server, MD  metFORMIN (GLUCOPHAGE) 500 MG tablet Take 1 tablet (500 mg total) by mouth 2 (two) times daily with a meal. 10/03/20   Earlie Server, MD  Multiple Vitamin (MULTIVITAMIN WITH MINERALS) TABS tablet Take 1 tablet by mouth daily. 09/12/20   [name], MD  pantoprazole (PROTONIX) 40 MG tablet Take 1 tablet (40 mg total) by mouth 2 (two) times daily. 06/26/21 07/26/21  Sidney Ace, MD  rivaroxaban (XARELTO) 20 MG TABS tablet Take 1 tablet (20 mg total) by mouth daily with supper. 01/25/21   Earlie Server, MD  rosuvastatin (CRESTOR) 10 MG tablet Take 1 tablet (10 mg total) by mouth daily. 10/03/20   Earlie Server, MD  simethicone (MYLICON) 80 MG chewable tablet Chew 1 tablet (80 mg total) by mouth 4 (four) times daily. 06/26/21   Sidney Ace, MD     Allergies  Patient has no known allergies.   Family History   Family History  Problem Relation Age of Onset   Hypertension Mother    Diabetes Mother    Diabetes Father    Hypertension Father    High Cholesterol Father    Congestive Heart Failure Father    Breast cancer Cousin      Physical Exam  Triage Vital Signs: ED Triage Vitals  Enc Vitals Group     BP 07/18/21 0157 (!) 115/58     Pulse Rate 07/18/21 0157 100     Resp 07/18/21 0157 (!) 24     Temp 07/18/21 0157 98 F (36.7 C)     Temp Source 07/18/21 0157 Oral     SpO2 07/18/21 0147 96 %     Weight 07/18/21 0155 241 lb 10 oz (109.6 kg)     Height 07/18/21 0155 4\' 11"  (1.499 m)     Head Circumference --      Peak Flow  --      Pain Score 07/18/21 0155 0     Pain Loc --      Pain Edu? --      Excl. in [place]? --     Updated Vital Signs: BP (!) 114/47    Pulse (!) 102    Temp 98 F (36.7 C) (Oral)    Resp (!) 23    Ht 4\' 11"  (1.499 m)    Wt 109.6 kg    SpO2 98%    BMI 48.80 kg/m    General: Awake, mild distress.  CV:  Good peripheral perfusion.  Resp:  Mildly increased effort.  Abd:  Nontender to palpation.  No distention.  Other:  Skin is pale.   ED Results /  Procedures / Treatments  Labs (all labs ordered are listed, but only abnormal results are displayed) Labs Reviewed  BASIC METABOLIC PANEL - Abnormal; Notable for the following components:      Result Value   BUN 26 (*)    All other components within normal limits  CBC - Abnormal; Notable for the following components:   RBC 2.05 (*)    Hemoglobin 4.8 (*)    HCT 17.3 (*)    MCH 23.4 (*)    MCHC 27.7 (*)    RDW 26.4 (*)    nRBC 0.5 (*)    All other components within normal limits  RESP PANEL BY RT-PCR (FLU A&B, COVID) ARPGX2  BRAIN NATRIURETIC PEPTIDE  URINALYSIS, ROUTINE W REFLEX MICROSCOPIC  CBG MONITORING, ED  TYPE AND SCREEN  PREPARE RBC (CROSSMATCH)  TROPONIN I (HIGH SENSITIVITY)  TROPONIN I (HIGH SENSITIVITY)     EKG  ED ECG REPORT I, Teyla Skidgel J, the attending physician, personally viewed and interpreted this ECG.   Date: 07/18/2021  EKG Time: 0157  Rate: 101  Rhythm: sinus tachycardia  Axis: Normal  Intervals:none  ST&T Change: Nonspecific    RADIOLOGY I have personally reviewed patient's chest x-ray as well as the radiology interpretation:  Chest x-ray: No acute cardiopulmonary process  Official radiology report(s): DG Chest 2 View  Result Date: 07/18/2021 CLINICAL DATA:  Shortness of breath. EXAM: CHEST - 2 VIEW COMPARISON:  Chest radiograph dated 06/20/2021 FINDINGS: No focal consolidation, pleural effusion, or pneumothorax. The cardiac silhouette is within normal limits. No acute osseous pathology.  Degenerative changes of the spine. An IVC filter is noted. IMPRESSION: No active cardiopulmonary disease. Electronically Signed   By: Anner Crete M.D.   On: 07/18/2021 02:17     PROCEDURES:  Critical Care performed: Yes, see critical care procedure note(s)  .1-3 Lead EKG Interpretation Performed by: Paulette Blanch, MD Authorized by: Paulette Blanch, MD     Interpretation: normal     ECG rate:  100   ECG rate assessment: tachycardic     Rhythm: sinus tachycardia     Ectopy: none     Conduction: normal   Comments:     Patient placed on cardiac monitor to evaluate for arrhythmias  CRITICAL CARE Performed by: Paulette Blanch   Total critical care time: 30 minutes  Critical care time was exclusive of separately billable procedures and treating other patients.  Critical care was necessary to treat or prevent imminent or life-threatening deterioration.  Critical care was time spent personally by me on the following activities: development of treatment plan with patient and/or surrogate as well as nursing, discussions with consultants, evaluation of patient's response to treatment, examination of patient, obtaining history from patient or surrogate, ordering and performing treatments and interventions, ordering and review of laboratory studies, ordering and review of radiographic studies, pulse oximetry and re-evaluation of patient's condition.    MEDICATIONS ORDERED IN ED: Medications  0.9 %  sodium chloride infusion (has no administration in time range)     IMPRESSION / MDM / ASSESSMENT AND PLAN / ED COURSE  I reviewed the triage vital signs and the nursing notes.                             66 year old female presenting for generalized weakness, anemia requiring transfusion.  Differential diagnosis includes but is not limited to acute on chronic blood loss, GI bleed, infectious, metabolic etiologies, etc.  I have  personally reviewed patient's chart and reviewed her  hospitalization, EGD and colonoscopy results from 06/20/2021-06/26/2021.   The patient is on the cardiac monitor to evaluate for evidence of arrhythmia and/or significant heart rate changes.  Laboratory results remarkable for H/H 4.8/17.3 which is decreased from most recent.  Will transfuse 2 units PRBC.  Will consult hospitalist services for evaluation and admission.       FINAL CLINICAL IMPRESSION(S) / ED DIAGNOSES   Final diagnoses:  Generalized weakness  Anemia, unspecified type  Gastrointestinal hemorrhage, unspecified gastrointestinal hemorrhage type     Rx / DC Orders   ED Discharge Orders     None        Note:  This document was prepared using Dragon voice recognition software and may include unintentional dictation errors.   Paulette Blanch, MD 07/18/21 [phone]

## 2021-07-18 NOTE — Progress Notes (Signed)
Brief hospitalist update note.  This is a nonbillable note.  Please see same-day H&P for full billable details.  Briefly, this is a 66 year old female who presents for evaluation of acute on chronic anemia associated with GI bleed.  Patient had a recent admission in December 2022 where she underwent upper and lower endoscopy as well as a video capsule study.  All these were negative for bleeding source.  Patient was restarted on her anticoagulation and discharged home.  She presents again with hemoglobin of 4.8.  Nuclear medicine bleeding scan on 1/11 is positive for lower GI bleed at splenic flexure.  Vascular surgery is consulted.  Plan for intervention today.  Patient remains NPO.  Has received 3 units PRBC so far.  Ralene Muskrat MD  No charge

## 2021-07-18 NOTE — Consult Note (Signed)
[facility] Consult Note  MRN : [id number]  Courtney Mack is a 66 y.o. ([birth year]) female who presents with chief complaint of  Chief Complaint  Patient presents with   Weakness   Shortness of Breath  .  History of Present Illness: I am asked to see the patient by Dr. Priscella Mann for GI bleed.  The patient has been on anticoagulation for severe DVTs from several months ago.  She had interventions for these as well.  She had a GI bleed issue back last month but was not found to have any obvious source of bleeding.  She presents on this admission with markedly reduced hemoglobin of 4.8 and bright red blood per rectum.  As part of her work-up she underwent a nuclear medicine study which I have independently reviewed and this appears to be positive for active bleeding at the splenic flexure.  Current Facility-Administered Medications  Medication Dose Route Frequency Provider Last Rate Last Admin   [MAR Hold] 0.9 %  sodium chloride infusion (Manually program via Guardrails IV Fluids)   Intravenous Once Ralene Muskrat B, MD       [facility] Hold] 0.9 %  sodium chloride infusion  10 mL/hr Intravenous Once Paulette Blanch, MD   Held at 07/18/21 0826   0.9 %  sodium chloride infusion   Intravenous Continuous Thersia Petraglia, Erskine Squibb, MD       [MAR Hold] acetaminophen (TYLENOL) tablet 650 mg  650 mg Oral Q6H PRN Sidney Ace, MD       [MAR Hold] dicyclomine (BENTYL) tablet 20 mg  20 mg Oral TID AC Sreenath, Sudheer B, MD   20 mg at 07/18/21 0918   [MAR Hold] diphenhydrAMINE (BENADRYL) capsule 25 mg  25 mg Oral Q6H PRN Sidney Ace, MD       [MAR Hold] docusate sodium (COLACE) capsule 100 mg  100 mg Oral BID PRN Athena Masse, MD       [MAR Hold] hydrALAZINE (APRESOLINE) injection 10 mg  10 mg Intravenous Q4H PRN Sreenath, Sudheer B, MD       [MAR Hold] mometasone-formoterol (DULERA) 100-5 MCG/ACT inhaler 2 puff  2 puff Inhalation BID Ralene Muskrat B, MD        [MAR Hold] ondansetron (ZOFRAN) injection 4 mg  4 mg Intravenous Q6H PRN Athena Masse, MD       [MAR Hold] pantoprazole (PROTONIX) injection 40 mg  40 mg Intravenous Q12H Ralene Muskrat B, MD   40 mg at 07/18/21 0919   [MAR Hold] polyethylene glycol (MIRALAX / GLYCOLAX) packet 17 g  17 g Oral Daily PRN Athena Masse, MD       [MAR Hold] simethicone [facility]) chewable tablet 80 mg  80 mg Oral QID Ralene Muskrat B, MD   80 mg at 07/18/21 5784    Past Medical History:  Diagnosis Date   Diabetes mellitus without complication ([place])    DVT (deep venous thrombosis) ([place]) 09/06/2020   High cholesterol    Hx of blood clots    Hypertension    IDA (iron deficiency anemia) 09/21/2020    Past Surgical History:  Procedure Laterality Date   COLONOSCOPY N/A 06/21/2021   Procedure: COLONOSCOPY;  Surgeon: Toledo, Benay Pike, MD;  Location: [facility] ENDOSCOPY;  Service: Gastroenterology;  Laterality: N/A;   ESOPHAGOGASTRODUODENOSCOPY N/A 06/21/2021   Procedure: ESOPHAGOGASTRODUODENOSCOPY (EGD);  Surgeon: Toledo, Benay Pike, MD;  Location: [facility] ENDOSCOPY;  Service: Gastroenterology;  Laterality: N/A;   GIVENS CAPSULE STUDY N/A  06/22/2021   Procedure: GIVENS CAPSULE STUDY;  Surgeon: Toledo, Benay Pike, MD;  Location: [facility] ENDOSCOPY;  Service: Gastroenterology;  Laterality: N/A;   IVC FILTER INSERTION N/A 08/18/2020   Procedure: IVC FILTER INSERTION;  Surgeon: Katha Cabal, MD;  Location: [facility];  Service: Cardiovascular;  Laterality: N/A;   PERIPHERAL VASCULAR THROMBECTOMY Bilateral 01/03/2021   Procedure: PERIPHERAL VASCULAR THROMBECTOMY;  Surgeon: Algernon Huxley, MD;  Location: [facility];  Service: Cardiovascular;  Laterality: Bilateral;   PULMONARY THROMBECTOMY N/A 08/18/2020   Procedure: PULMONARY THROMBECTOMY;  Surgeon: Katha Cabal, MD;  Location: [place] [facility];  Service: Cardiovascular;  Laterality: N/A;   TUBAL LIGATION       Social History   Tobacco Use    Smoking status: Former    Packs/day: 0.25    Years: 0.50    Pack years: 0.13    Types: Cigarettes    Quit date: 09/21/1980    Years since quitting: 40.8   Smokeless tobacco: Never   Tobacco comments:    smoked 2-4 cigarettes for about 2-3 weeks   Vaping Use   Vaping Use: Never used  Substance Use Topics   Alcohol use: Not Currently   Drug use: Not Currently     Family History  Problem Relation Age of Onset   Hypertension Mother    Diabetes Mother    Diabetes Father    Hypertension Father    High Cholesterol Father    Congestive Heart Failure Father    Breast cancer Cousin     No Known Allergies   REVIEW OF SYSTEMS (Negative unless checked)  Constitutional: [] Weight loss  [] Fever  [] Chills Cardiac: [] Chest pain   [] Chest pressure   [] Palpitations   [] Shortness of breath when laying flat   [] Shortness of breath at rest   [x] Shortness of breath with exertion. Vascular:  [] Pain in legs with walking   [] Pain in legs at rest   [] Pain in legs when laying flat   [] Claudication   [] Pain in feet when walking  [] Pain in feet at rest  [] Pain in feet when laying flat   [x] History of DVT   [x] Phlebitis   [x] Swelling in legs   [] Varicose veins   [] Non-healing ulcers Pulmonary:   [] Uses home oxygen   [] Productive cough   [] Hemoptysis   [] Wheeze  [] COPD   [] Asthma Neurologic:  [] Dizziness  [] Blackouts   [] Seizures   [] History of stroke   [] History of TIA  [] Aphasia   [] Temporary blindness   [] Dysphagia   [] Weakness or numbness in arms   [] Weakness or numbness in legs Musculoskeletal:  [x] Arthritis   [] Joint swelling   [] Joint pain   [] Low back pain Hematologic:  [] Easy bruising  [] Easy bleeding   [] Hypercoagulable state   [x] Anemic  [] Hepatitis Gastrointestinal:  [x] Blood in stool   [] Vomiting blood  [x] Gastroesophageal reflux/heartburn   [] Difficulty swallowing. Genitourinary:  [x] Chronic kidney disease   [] Difficult urination  [] Frequent urination  [] Burning with urination   [] Blood in  urine Skin:  [] Rashes   [] Ulcers   [] Wounds Psychological:  [] History of anxiety   []  History of major depression.  Physical Examination  Vitals:   07/18/21 1256 07/18/21 1305 07/18/21 1330 07/18/21 1524  BP: (!) 122/91 124/65 (!) 126/57 (!) 117/56  Pulse:   95 94  Resp:   (!) 21 18  Temp:    98.2 F (36.8 C)  TempSrc:    Oral  SpO2:   94% 96%  Weight:  123.8 kg  Height:    4\' 11"  (1.499 m)   Body mass index is 55.14 kg/m. Gen:  WD/WN, NAD. Appears older than stated age. Head: [place]/AT, No temporalis wasting. Ear/Nose/Throat: Hearing grossly intact, nares w/o erythema or drainage, oropharynx w/o Erythema/Exudate Eyes: Sclera non-icteric, conjunctiva clear Neck: Trachea midline.  No JVD.  Pulmonary:  Good air movement, respirations not labored, equal bilaterally.  Cardiac: irregular Vascular:  Vessel Right Left  Radial Palpable Palpable                                   Gastrointestinal: soft, non-tender/non-distended. No guarding/reflex.  Musculoskeletal: M/S 5/5 throughout.  Extremities without ischemic changes.  No deformity or atrophy. Moderate LE edema Neurologic: Sensation grossly intact in extremities.  Symmetrical.  Speech is fluent. Motor exam as listed above. Psychiatric: Judgment intact, Mood & affect appropriate for pt's clinical situation. Dermatologic: No rashes or ulcers noted.  No cellulitis or open wounds.      CBC Lab Results  Component Value Date   WBC 5.3 07/18/2021   HGB 5.9 (L) 07/18/2021   HCT 19.6 (L) 07/18/2021   MCV 82.4 07/18/2021   PLT 208 07/18/2021    BMET    Component Value Date/Time   NA 136 07/18/2021 0201   K 3.7 07/18/2021 0201   CL 104 07/18/2021 0201   CO2 26 07/18/2021 0201   GLUCOSE 99 07/18/2021 0201   BUN 26 (H) 07/18/2021 0201   CREATININE 0.69 07/18/2021 0201   CALCIUM 9.0 07/18/2021 0201   GFRNONAA >60 07/18/2021 0201   Estimated Creatinine Clearance: 82.3 mL/min (by C-G formula based on SCr of 0.69  mg/dL).  COAG Lab Results  Component Value Date   INR 1.7 (H) 06/20/2021   INR 1.2 01/02/2021   INR 2.6 (H) 09/20/2020    Radiology DG Chest 2 View  Result Date: 07/18/2021 CLINICAL DATA:  Shortness of breath. EXAM: CHEST - 2 VIEW COMPARISON:  Chest radiograph dated 06/20/2021 FINDINGS: No focal consolidation, pleural effusion, or pneumothorax. The cardiac silhouette is within normal limits. No acute osseous pathology. Degenerative changes of the spine. An IVC filter is noted. IMPRESSION: No active cardiopulmonary disease. Electronically Signed   By: Anner Crete M.D.   On: 07/18/2021 02:17   CT HEAD WO CONTRAST (5MM)  Result Date: 06/20/2021 CLINICAL DATA:  Head trauma, minor (Age >= 65y) EXAM: CT HEAD WITHOUT CONTRAST TECHNIQUE: Contiguous axial images were obtained from the base of the skull through the vertex without intravenous contrast. COMPARISON:  CT head 01/29/2007. FINDINGS: Brain: No evidence of acute large vascular territory infarction, hemorrhage, hydrocephalus, extra-axial collection or mass lesion/mass effect. Small hypodensity in the inferior right basal ganglia, likely a benign dilated perivascular space given characteristic location. Mild atrophy. Vascular: No hyperdense vessel identified. Calcific intracranial atherosclerosis. Skull: No acute fracture. Sinuses/Orbits: Moderate mucosal thickening of the right maxillary sinus. Mild ethmoid air cell mucosal thickening. Unremarkable orbits. Other: No mastoid effusions.  Cerumen in the right EAC. IMPRESSION: No evidence of acute intracranial abnormality. Electronically Signed   By: Margaretha Sheffield M.D.   On: 06/20/2021 09:27   NM GI Blood Loss  Result Date: 07/18/2021 CLINICAL DATA:  Lower GI bleeding EXAM: NUCLEAR MEDICINE GASTROINTESTINAL BLEEDING SCAN TECHNIQUE: Sequential abdominal images were obtained following intravenous administration of Tc-66m labeled red blood cells. RADIOPHARMACEUTICALS:  21.3 mCi Tc-58m  pertechnetate in-vitro labeled red cells. COMPARISON:  06/10/2021 FINDINGS: Within the first hour  of imaging, there is peristalsing activity beginning in the left upper quadrant and progressing into the rectum compatible with active GI bleeding in the splenic flexure of the colon. Second hour of imaging was not performed. IMPRESSION: Positive exam for lower GI bleeding in the splenic flexure of the colon. These results will be called to the ordering clinician or representative by the Radiologist Assistant, and communication documented in the PACS or [employer]. Electronically Signed   By: Jerilynn Mages.  Shick M.D.   On: 07/18/2021 13:36   DG Chest Port 1 View  Result Date: 06/20/2021 CLINICAL DATA:  suspected sepsis EXAM: PORTABLE CHEST 1 VIEW COMPARISON:  01/02/2021 FINDINGS: The heart is mildly enlarged. No pulmonary vascular congestion. Lungs are clear. IMPRESSION: Mild cardiomegaly. Electronically Signed   By: Miachel Roux M.D.   On: 06/20/2021 08:45   CT Angio Abd/Pel w/ and/or w/o  Result Date: 06/20/2021 CLINICAL DATA:  Anemia, epigastric abdominal pain EXAM: CTA ABDOMEN AND PELVIS WITHOUT AND WITH CONTRAST TECHNIQUE: Multidetector CT imaging of the abdomen and pelvis was performed using the standard protocol during bolus administration of intravenous contrast. Multiplanar reconstructed images and MIPs were obtained and reviewed to evaluate the vascular anatomy. CONTRAST:  172mL OMNIPAQUE IOHEXOL 350 MG/ML SOLN COMPARISON:  None. FINDINGS: VASCULAR Aorta: Normal caliber aorta without aneurysm, dissection, vasculitis or significant stenosis. Diffuse atherosclerosis. Celiac: Patent without evidence of aneurysm, dissection, vasculitis or significant stenosis. Mild atherosclerosis. SMA: Patent without evidence of aneurysm, dissection, vasculitis or significant stenosis. Moderate atherosclerosis at the origin of the SMA, with less than 50% stenosis. Renals: Both renal arteries are patent without evidence of  aneurysm, dissection, vasculitis, fibromuscular dysplasia or significant stenosis. Moderate atherosclerosis. IMA: Patent without evidence of aneurysm, dissection, vasculitis or significant stenosis. Inflow: Patent without evidence of aneurysm, dissection, vasculitis or significant stenosis. Proximal Outflow: Bilateral common femoral and visualized portions of the superficial and profunda femoral arteries are patent without evidence of aneurysm, dissection, vasculitis or significant stenosis. Veins: IVC filter. Decreased caliber of the bilateral common iliac veins may reflect chronic veno-occlusive disease. Numerous venous collaterals are seen within the abdominal wall and retroperitoneum. Review of the MIP images confirms the above findings. NON-VASCULAR Lower chest: No acute pleural or parenchymal lung disease. Hepatobiliary: No focal liver abnormality is seen. No gallstones, gallbladder wall thickening, or biliary dilatation. Pancreas: Unremarkable. No pancreatic ductal dilatation or surrounding inflammatory changes. Spleen: Centripetal enhancement of a hypodensity in the superior aspect of the spleen compatible with hemangioma. No evidence of splenomegaly. Adrenals/Urinary Tract: Adrenal glands are unremarkable. Kidneys are normal, without renal calculi, focal lesion, or hydronephrosis. Bladder is decompressed, limiting its evaluation. Stomach/Bowel: Dense stool throughout the colon limits evaluation for active gastrointestinal bleeding. I do not see any intraluminal accumulation of contrast within the bowel to suggest active hemorrhage. No bowel obstruction or ileus. Mild wall thickening of the distal rectum with perirectal fat stranding may reflect proctitis. Scattered diverticulosis within the colon without evidence of diverticulitis. Lymphatic: Borderline enlarged retroperitoneal lymph nodes are identified, measuring up to 10 mm in the left para-aortic region. No other pathologic adenopathy. Reproductive:  Uterus and bilateral adnexa are unremarkable. Other: No free fluid or free intraperitoneal gas. Mild subcutaneous edema throughout the lower anterior abdominal wall and proximal lower extremities. Musculoskeletal: Severe bilateral hip osteoarthritis. No acute or destructive bony lesions. Reconstructed images demonstrate no additional findings. IMPRESSION: VASCULAR 1. No evidence of active gastrointestinal bleeding. Limited evaluation of the colon due to dense stool. 2.  Aortic Atherosclerosis (ICD10-I70.0). NON-VASCULAR 1. Mild distal  rectal wall thickening and perirectal fat stranding which could reflect proctitis. 2. Scattered colonic diverticulosis without diverticulitis. 3. Borderline enlarged retroperitoneal lymph nodes, measuring up to 10 mm, nonspecific. 4. Decreased caliber of the bilateral iliac veins and distal IVC below the IVC filter, consistent with chronic occlusion. 5.  Aortic Atherosclerosis (ICD10-I70.0). Electronically Signed   By: Randa Ngo M.D.   On: 06/20/2021 18:08      Assessment/Plan 1. Gi bleed. As part of her work-up she underwent a nuclear medicine study which I have independently reviewed and this appears to be positive for active bleeding at the splenic flexure.  Given this finding, embolization is indicated to try to stop the bleeding.  This is far less invasive than surgical resection.  We will hold her anticoagulation for now, but after embolization I hope we will be able to restart this in the next 48 to 72 hours due to her severe DVT issues previously 2.  Anemia.  Acute blood loss anemia as well as acute on chronic anemia.  She was as low as 4.8 and has received transfusion.  Was 5.9 at last check in May benefit from another transfusion. 3.  DVT.  Was on anticoagulation for this.  Does have significant lower extremity swelling and postphlebitic issues.  She had a very severe DVT and if we can get her back on anticoagulation in a few days this would be of  benefit   Leotis Pain, MD  07/18/2021 3:59 PM    This note was created with Dragon medical transcription system.  Any error is purely unintentional

## 2021-07-18 NOTE — ED Notes (Signed)
Olivia RN aware of assigned bed 

## 2021-07-18 NOTE — ED Notes (Signed)
Per Priscella Mann, MD- get CBC after 2nd blood transfusion is complete.

## 2021-07-18 NOTE — ED Triage Notes (Addendum)
Pt arrived via [facility] from home reports of not feeling well tonight, pt states she started feeling lightheaded and states she feels like her blood counts are low, pt here a few weeks ago requiring a blood transfusion. Pt does appear pale on arrival.   Pt repeatedly states her head doesn't feel right.  Pt states when she stands up she gets dizzy. Pt also states when she is ambulating she has been getting short of breath, pt unable to speak in full sentences without running out of breath.

## 2021-07-18 NOTE — Op Note (Signed)
[facility]  Percutaneous Study/Intervention Procedural Note     Surgeon(s): [employer]   Assistants: none  Pre-operative Diagnosis: 1. Lower GI bleed 2.  Recent DVT and PE on anticoagulation  Post-operative diagnosis:  Same  Procedure(s) Performed:             1.  Ultrasound guidance for vascular access right femoral artery             2.  Catheter placement into splenic flexure branch of the IMA             3.  Aortogram and selective angiogram of the IMA as well as direct injection of the splenic flexure branch of the IMA             4.  Microbead embolization of the main splenic flexure branch of the IMA with 1.5 cc of 500-700  polyvinyl alcohol beads as well as microbead embolization of the 2 nearby branches to the splenic flexure also feeding the area with 0.5 cc of 500 700 m polyvinyl alcohol beads in each branch             5.  StarClose closure device right femoral artery  Anesthesia: Moderate conscious sedation for approximately 23 minutes using 2 mg of Versed and 50 mcg of Fentanyl              EBL: 5 cc  Fluoro Time: 4.1 minutes  Contrast: 65 cc              Indications:  Patient is a 66 y.o.female with brisk lower GI bleeding with resultant anemia. The patient has a nuclear medicine study showing bleeding at the splenic flexure. The patient is brought in for angiography for further evaluation and potential treatment. Risks and benefits are discussed and informed consent is obtained  Procedure:  The patient was identified and appropriate procedural time out was performed.  The patient was then placed supine on the table and prepped and draped in the usual sterile fashion. Moderate conscious sedation was administered during a face to face encounter with the patient throughout the procedure with my supervision of the RN administering medicines and monitoring the patient's vital signs, pulse oximetry, telemetry and mental status throughout from the  start of the procedure until the patient was taken to the recovery room.  Ultrasound was used to evaluate the right common femoral artery.  It was patent .  A digital ultrasound image was acquired.  A Seldinger needle was used to access the right common femoral artery under direct ultrasound guidance and a permanent image was performed.  A 0.035 J wire was advanced without resistance and a 5Fr sheath was placed.  Pigtail catheter was placed into the aorta and an AP aortogram was performed. This demonstrated normal renal arteries, normal aorta and iliac arteries without significant stenosis.  We transitioned to the RAO projection to cannulate the IMA. A VS 1 catheter was used to selectively cannulate the IMA.  This demonstrated 3 primary branches of the IMA including the splenic flexure branch which was very superior going.  There were 2 branches off of this primary branches well that also fed blood flow into the splenic flexure. Based on her continued bleeding and the nuclear medicine study I elected to treat this area with embolization. I initially advanced the Pro-Great microcatheter out the main splenic flexure branch and instilled approximately 1.5 cc of 500 to 700 m polyvinyl alcohol beads in this location which was  right at the splenic flexure. Angiogram following this showed the main vessels to be open with less brisk filling. I then pulled back and cannulated the first of the 2 surrounding branches with the Pro-great microcatheter without difficulty. Selective imaging was performed which showed flow getting to the splenic flexure.  0.5 cc of the 500 to 700 m polyvinyl alcohol beads were deployed in the first of the 2 surrounding branches and then I cannulated the second branch which was more distal with a prograde microcatheter.  After selective imaging, another 0.5 cc of the 500 to 700 m polyvinyl alcohol beads were deployed in this location as well. Again, completion angiogram showed the main vessels to  be open with less brisk filling. I elected to terminate the procedure. The diagnostic catheter was removed. StarClose closure device was deployed in usual fashion with excellent hemostatic result. The patient was taken to the recovery room in stable condition having tolerated the procedure well.     Findings: There did appear to be some faint extravasation in the area of the splenic flexure off of the large splenic flexure branch of the IMA.  This area and the nearby vessels also feeding this area were treated with microbead embolization  Disposition: Patient was taken to the recovery room in stable condition having tolerated the procedure well.  Complications:  None  Courtney Mack 07/18/2021 4:51 PM   This note was created with Dragon Medical transcription system. Any errors in dictation are purely unintentional.

## 2021-07-18 NOTE — Progress Notes (Signed)
[place] for [facility] Indication: DOAC reversal  No Known Allergies  Patient Measurements: Height: 4\' 11"  (149.9 cm) Weight: 109.6 kg (241 lb 10 oz) IBW/kg (Calculated) : 43.2  Labs: Recent Labs    07/18/21 0201 07/18/21 0632  HGB 4.8*  --   HCT 17.3*  --   PLT 269  --   CREATININE 0.69  --   TROPONINIHS 8 4    Estimated Creatinine Clearance: 76.2 mL/min (by C-G formula based on SCr of 0.69 mg/dL).   Medical History: Past Medical History:  Diagnosis Date   Diabetes mellitus without complication ([place])    DVT (deep venous thrombosis) ([place]) 09/06/2020   High cholesterol    Hx of blood clots    Hypertension    IDA (iron deficiency anemia) 09/21/2020    Medications:  Pt on Xarelto 20 mg - last dose 07/17/21  Assessment:  66 y.o. female with medical history significant for  DVT/PE on Xarelto, hypertension, hyperlipidemia, diabetes mellitus, anemia, obesity, endometrial cancer, dCHF, morbid obesity, recently hospitalized from 12/14-12/20 with acute blood loss anemia requiring 4 units PRBC with negative upper and lower GI as well as negative capsule endoscopy, restarted on Xarelto at discharge who presents again to the emergency room with 2-day history of tarry stools associated with weakness and shortness of breath. Pharmacy consulted for [facility] for [place] reversal.   Plan:  Pt received 5000 units KCENTRA x 1 dose No additional lab monitoring for DOAC reversal   Anwar Sakata O Tamarion Haymond 07/18/2021,8:17 AM

## 2021-07-18 NOTE — ED Triage Notes (Signed)
Pt to lobby via w/c with no distress noted; EMS brings pt in from home for c/o "not feeling well"; st here mo ago for same and was due to blood loss, unknown origin; hx CHF, anmeia

## 2021-07-18 NOTE — ED Notes (Addendum)
Message sent to Priscella Mann, MD regarding pt's increase temp since starting 2nd bag of blood. Temp was 98.4 and is now 99.6. Patient denies any symptoms at this time. Will continue to monitor- PRN's ordered by MD if needed.

## 2021-07-19 ENCOUNTER — Other Ambulatory Visit: Payer: Medicare HMO

## 2021-07-19 ENCOUNTER — Ambulatory Visit: Payer: Medicare (Managed Care) | Admitting: Radiation Oncology

## 2021-07-19 ENCOUNTER — Encounter: Payer: Self-pay | Admitting: Vascular Surgery

## 2021-07-19 LAB — CBC
HCT: 25.4 % — ABNORMAL LOW (ref 36.0–46.0)
Hemoglobin: 8 g/dL — ABNORMAL LOW (ref 12.0–15.0)
MCH: 25.6 pg — ABNORMAL LOW (ref 26.0–34.0)
MCHC: 31.5 g/dL (ref 30.0–36.0)
MCV: 81.2 fL (ref 80.0–100.0)
Platelets: 231 10*3/uL (ref 150–400)
RBC: 3.13 MIL/uL — ABNORMAL LOW (ref 3.87–5.11)
RDW: 20.3 % — ABNORMAL HIGH (ref 11.5–15.5)
WBC: 7.6 10*3/uL (ref 4.0–10.5)
nRBC: 0.4 % — ABNORMAL HIGH (ref 0.0–0.2)

## 2021-07-19 LAB — BASIC METABOLIC PANEL
Anion gap: 10 (ref 5–15)
BUN: 19 mg/dL (ref 8–23)
CO2: 23 mmol/L (ref 22–32)
Calcium: 8.4 mg/dL — ABNORMAL LOW (ref 8.9–10.3)
Chloride: 105 mmol/L (ref 98–111)
Creatinine, Ser: 0.55 mg/dL (ref 0.44–1.00)
GFR, Estimated: >60 mL/min (ref >60)
Glucose, Bld: 97 mg/dL (ref 70–99)
Potassium: 3.2 mmol/L — ABNORMAL LOW (ref 3.5–5.1)
Sodium: 138 mmol/L (ref 135–145)

## 2021-07-19 LAB — HEMOGLOBIN
Hemoglobin: 8.4 g/dL — ABNORMAL LOW (ref 12.0–15.0)
Hemoglobin: 7.8 g/dL — ABNORMAL LOW (ref 12.0–15.0)

## 2021-07-19 LAB — MAGNESIUM: Magnesium: 1.5 mg/dL — ABNORMAL LOW (ref 1.7–2.4)

## 2021-07-19 LAB — PHOSPHORUS: Phosphorus: 3 mg/dL (ref 2.5–4.6)

## 2021-07-19 LAB — PREPARE RBC (CROSSMATCH)

## 2021-07-19 MED ORDER — MAGNESIUM SULFATE 2 GM/50ML IV SOLN
2.0000 g | Freq: Once | INTRAVENOUS | Status: AC
  Administered 2021-07-19: 2 g via INTRAVENOUS
  Filled 2021-07-19: qty 50

## 2021-07-19 MED ORDER — LOPERAMIDE HCL 2 MG PO CAPS
4.0000 mg | ORAL_CAPSULE | ORAL | Status: DC | PRN
  Administered 2021-07-19: 4 mg via ORAL
  Filled 2021-07-19: qty 2

## 2021-07-19 MED ORDER — ENSURE ENLIVE PO LIQD
237.0000 mL | Freq: Three times a day (TID) | ORAL | Status: DC
  Administered 2021-07-19 – 2021-07-23 (×9): 237 mL via ORAL

## 2021-07-19 MED ORDER — SODIUM CHLORIDE 0.9% IV SOLUTION: Freq: Once | INTRAVENOUS | Status: AC

## 2021-07-19 MED ORDER — POTASSIUM CHLORIDE CRYS ER 20 MEQ PO TBCR
40.0000 meq | EXTENDED_RELEASE_TABLET | Freq: Once | ORAL | Status: AC
  Administered 2021-07-19: 40 meq via ORAL
  Filled 2021-07-19: qty 2

## 2021-07-19 MED ORDER — ADULT MULTIVITAMIN W/MINERALS CH
1.0000 | ORAL_TABLET | Freq: Every day | ORAL | Status: DC
  Administered 2021-07-20 – 2021-07-23 (×4): 1 via ORAL
  Filled 2021-07-19 (×4): qty 1

## 2021-07-19 NOTE — Progress Notes (Signed)
PROGRESS NOTE    Courtney Mack  YOV:[id number] DOB: [birth year] DOA: 07/18/2021 PCP: [facility] [place]    Brief Narrative:   66 year old female who presents for evaluation of acute on chronic anemia associated with GI bleed.  Patient had a recent admission in December 2022 where she underwent upper and lower endoscopy as well as a video capsule study.  All these were negative for bleeding source.  Patient was restarted on her anticoagulation and discharged home.  She presents again with hemoglobin of 4.8.   Nuclear medicine bleeding scan on 1/11 is positive for lower GI bleed at splenic flexure.  Vascular surgery is consulted.  Status post endovascular embolization on 1/11.  Tolerated procedure well.  Assessment & Plan:   Principal Problem:   Melena Active Problems:   Acute on chronic blood loss anemia  GI bleed Acute on chronic blood loss anemia Status post Kcentra in ED Hemoglobin on presentation 4.8 Received 3 units PRBC so far NM bleeding scan positive for GI bleed at splenic flexure Status postembolization with vascular surgery on 1/11 Plan: Continue holding Xarelto for now If hemoglobin stable can consider restarting in 24 to 36 hours Trend hemoglobin Maintain hemoglobin greater than 8 Cautiously advance diet   Hx of Pulmonary embolism and DVT:  -Pt is s/p of IVC filter on 08/18/20. -High risk DVT Ideally would like to restart Xarelto sometime within the next 1 to 2 days if hemoglobin remained stable   Diabetes mellitus without complication [facility]):  -Sliding scale insulin coverage   HTN: -hold Cozaar since patient is at high risk of developing hypotension due to severe anemia -IV hydralazine as needed   Chronic diastolic CHF (congestive heart failure) ([place]):  -2D echo on 08/19/2020 showed EF 60 to 65% with grade 1 diastolic dysfunction.   -Chest x-ray is negative for pulmonary edema, does not seem to have CHF exacerbation. -W\ill give as  needed Lasix while patient is getting blood transfusion   DVT prophylaxis: SCD Code Status: Full Family Communication: None Disposition Plan: Status is: Inpatient  Remains inpatient appropriate because: Severe acute on chronic anemia.  GI bleed.  Status post embolization.  Possible discharge in 24 to 48 hours.  Hemoglobin remained stable.       Level of care: Med-Surg  Consultants:  Vascular surgery  Procedures:  Endovascular embolization 1/11  Antimicrobials: None   Subjective: Examined.  Resting in bed.  No visible distress.  Complains of loose stools.  Objective: Vitals:   07/19/21 0400 07/19/21 0407 07/19/21 0500 07/19/21 0806  BP:  (!) 163/65  (!) 149/64  Pulse:  (!) 103  (!) 105  Resp:  20  20  Temp:  97.7 F (36.5 C)  98.8 F (37.1 C)  TempSrc:  Oral  Oral  SpO2:  95%  93%  Weight: 101.8 kg  99.4 kg   Height:        Intake/Output Summary (Last 24 hours) at 07/19/2021 1113 Last data filed at 07/19/2021 1029 Gross per 24 hour  Intake 842 ml  Output 200 ml  Net 642 ml   Filed Weights   07/18/21 1524 07/19/21 0400 07/19/21 0500  Weight: 123.8 kg 101.8 kg 99.4 kg    Examination:  General exam: No acute distress.  Appears frail Respiratory system: Lungs clear.  Normal work of breathing.  Room air Cardiovascular system: S1-S2, RRR, no murmurs, no pedal edema Gastrointestinal system: Soft, NT/ND, normal bowel sounds Central nervous system: Alert and oriented. No focal neurological deficits.  Extremities: Symmetric 5 x 5 power. Skin: Pale, no obvious rashes or lesions Psychiatry: Judgement and insight appear normal. Mood & affect appropriate.     Data Reviewed: I have personally reviewed following labs and imaging studies  CBC: Recent Labs  Lab 07/18/21 0201 07/18/21 1120 07/19/21 0354  WBC 5.7 5.3 7.6  HGB 4.8* 5.9* 8.0*  HCT 17.3* 19.6* 25.4*  MCV 84.4 82.4 81.2  PLT 269 208 009   Basic Metabolic Panel: Recent Labs  Lab 07/18/21 0201  07/19/21 0354  NA 136 138  K 3.7 3.2*  CL 104 105  CO2 26 23  GLUCOSE 99 97  BUN 26* 19  CREATININE 0.69 0.55  CALCIUM 9.0 8.4*  MG  --  1.5*  PHOS  --  3.0   GFR: Estimated Creatinine Clearance: 71.7 mL/min (by C-G formula based on SCr of 0.55 mg/dL). Liver Function Tests: No results for input(s): AST, ALT, ALKPHOS, BILITOT, PROT, ALBUMIN in the last 168 hours. No results for input(s): LIPASE, AMYLASE in the last 168 hours. No results for input(s): AMMONIA in the last 168 hours. Coagulation Profile: No results for input(s): INR, PROTIME in the last 168 hours. Cardiac Enzymes: No results for input(s): CKTOTAL, CKMB, CKMBINDEX, TROPONINI in the last 168 hours. BNP (last 3 results) No results for input(s): PROBNP in the last 8760 hours. HbA1C: No results for input(s): HGBA1C in the last 72 hours. CBG: Recent Labs  Lab 07/18/21 0245 07/18/21 1509 07/18/21 1721  GLUCAP 96 89 111*   Lipid Profile: No results for input(s): CHOL, HDL, LDLCALC, TRIG, CHOLHDL, LDLDIRECT in the last 72 hours. Thyroid Function Tests: No results for input(s): TSH, T4TOTAL, FREET4, T3FREE, THYROIDAB in the last 72 hours. Anemia Panel: No results for input(s): VITAMINB12, FOLATE, FERRITIN, TIBC, IRON, RETICCTPCT in the last 72 hours. Sepsis Labs: No results for input(s): PROCALCITON, LATICACIDVEN in the last 168 hours.  Recent Results (from the past 240 hour(s))  Resp Panel by RT-PCR (Flu A&B, Covid) Nasopharyngeal Swab     Status: None   Collection Time: 07/18/21  2:01 AM   Specimen: Nasopharyngeal Swab; Nasopharyngeal(NP) swabs in vial transport medium  Result Value Ref Range Status   SARS Coronavirus 2 by RT PCR NEGATIVE NEGATIVE Final    Comment: (NOTE) SARS-CoV-2 target nucleic acids are NOT DETECTED.  The SARS-CoV-2 RNA is generally detectable in upper respiratory specimens during the acute phase of infection. The lowest concentration of SARS-CoV-2 viral copies this assay can detect  is 138 copies/mL. A negative result does not preclude SARS-Cov-2 infection and should not be used as the sole basis for treatment or other patient management decisions. A negative result may occur with  improper specimen collection/handling, submission of specimen other than nasopharyngeal swab, presence of viral mutation(s) within the areas targeted by this assay, and inadequate number of viral copies(<138 copies/mL). A negative result must be combined with clinical observations, patient history, and epidemiological information. The expected result is Negative.  Fact Sheet for Patients:  EntrepreneurPulse.com.au  Fact Sheet for Healthcare Providers:  IncredibleEmployment.be  This test is no t yet approved or cleared by the [place] FDA and  has been authorized for detection and/or diagnosis of SARS-CoV-2 by FDA under an Emergency Use Authorization (EUA). This EUA will remain  in effect (meaning this test can be used) for the duration of the COVID-19 declaration under Section 564(b)(1) of the Act, 21 U.S.C.section 360bbb-3(b)(1), unless the authorization is terminated  or revoked sooner.       Influenza  A by PCR NEGATIVE NEGATIVE Final   Influenza B by PCR NEGATIVE NEGATIVE Final    Comment: (NOTE) The Xpert Xpress SARS-CoV-2/FLU/RSV plus assay is intended as an aid in the diagnosis of influenza from Nasopharyngeal swab specimens and should not be used as a sole basis for treatment. Nasal washings and aspirates are unacceptable for Xpert Xpress SARS-CoV-2/FLU/RSV testing.  Fact Sheet for Patients: EntrepreneurPulse.com.au  Fact Sheet for Healthcare Providers: IncredibleEmployment.be  This test is not yet approved or cleared by the [place] FDA and has been authorized for detection and/or diagnosis of SARS-CoV-2 by FDA under an Emergency Use Authorization (EUA). This EUA will remain in effect  (meaning this test can be used) for the duration of the COVID-19 declaration under Section 564(b)(1) of the Act, 21 U.S.C. section 360bbb-3(b)(1), unless the authorization is terminated or revoked.  Performed at [facility], [address] [place], [place] [address]          Radiology Studies: DG Chest 2 View  Result Date: 07/18/2021 CLINICAL DATA:  Shortness of breath. EXAM: CHEST - 2 VIEW COMPARISON:  Chest radiograph dated 06/20/2021 FINDINGS: No focal consolidation, pleural effusion, or pneumothorax. The cardiac silhouette is within normal limits. No acute osseous pathology. Degenerative changes of the spine. An IVC filter is noted. IMPRESSION: No active cardiopulmonary disease. Electronically Signed   By: Anner Crete M.D.   On: 07/18/2021 02:17   NM GI Blood Loss  Result Date: 07/18/2021 CLINICAL DATA:  Lower GI bleeding EXAM: NUCLEAR MEDICINE GASTROINTESTINAL BLEEDING SCAN TECHNIQUE: Sequential abdominal images were obtained following intravenous administration of Tc-29m labeled red blood cells. RADIOPHARMACEUTICALS:  21.3 mCi Tc-46m pertechnetate in-vitro labeled red cells. COMPARISON:  06/10/2021 FINDINGS: Within the first hour of imaging, there is peristalsing activity beginning in the left upper quadrant and progressing into the rectum compatible with active GI bleeding in the splenic flexure of the colon. Second hour of imaging was not performed. IMPRESSION: Positive exam for lower GI bleeding in the splenic flexure of the colon. These results will be called to the ordering clinician or representative by the Radiologist Assistant, and communication documented in the PACS or [employer]. Electronically Signed   By: Jerilynn Mages.  Shick M.D.   On: 07/18/2021 13:36   PERIPHERAL VASCULAR CATHETERIZATION  Result Date: 07/18/2021 See surgical note for result.       Scheduled Meds:  dicyclomine  20 mg Oral TID AC   mometasone-formoterol  2 puff Inhalation BID    pantoprazole (PROTONIX) IV  40 mg Intravenous Q12H   simethicone  80 mg Oral QID   Continuous Infusions:  sodium chloride Stopped (07/18/21 0826)   magnesium sulfate bolus IVPB 2 g (07/19/21 1020)     LOS: 1 day    Time spent: 35 minutes    Sidney Ace, MD [facility]   If 7PM-7AM, please contact night-coverage  07/19/2021, 11:13 AM

## 2021-07-19 NOTE — TOC Initial Note (Addendum)
Transition of Care [facility]) - Initial/Assessment Note    Patient Details  Name: Courtney Mack MRN: [id number] Date of Birth: 1956-01-05  Transition [facility]) CM/SW Contact:    Beverly Sessions, RN Phone Number: 07/19/2021, 10:12 AM  Clinical Narrative:                  Patient admitted with Melena Patient with high risk for readmission score Patient was assessed by [facility] 06/23/21.  Note from that admission below "Readmission prevention screen complete. CSW met with patient. No supports at bedside. CSW introduced role and explained that discharge planning would be discussed. PCP is a female provider at [facility] but patient cannot remember her name. Daughter drives her to appointments due to a cataract she has. Pharmacy is [facility] on [address] beside [address]. No issues obtaining medications. No home health prior to admission. Patient has a RW, bedside commode, and shower chair at home. PT/OT evals pending. Will follow up if they make any recommendations. No further concerns. CSW encouraged patient to contact CSW as needed. CSW will continue to follow patient for support and facilitate discharge once medically stable.  "  Previous admission patient was set up with home health services through [facility].  Message sent to [facility] with [place] [facility] to confirm if patient is still open   Corene Cornea with [facility] confirmed patient is active Expected Discharge Plan: [place] Barriers to Discharge: Continued Medical Work up   Patient Goals and CMS Choice        Expected Discharge Plan and Services Expected Discharge Plan: [place]                                              Prior Living Arrangements/Services                       Activities of Daily Living Home Assistive Devices/Equipment: Eyeglasses, Dentures (specify type), Walker (specify type), Grab bars in shower, Grab bars  around toilet ADL Screening (condition at time of admission) Patient's cognitive ability adequate to safely complete daily activities?: Yes Is the patient deaf or have difficulty hearing?: No Does the patient have difficulty seeing, even when wearing glasses/contacts?: No Does the patient have difficulty concentrating, remembering, or making decisions?: No Patient able to express need for assistance with ADLs?: Yes Does the patient have difficulty dressing or bathing?: Yes Independently performs ADLs?: No Communication: Independent Dressing (OT): Needs assistance Is this a change from baseline?: Change from baseline, expected to last <3days Grooming: Needs assistance Is this a change from baseline?: Change from baseline, expected to last <3 days Feeding: Independent Bathing: Needs assistance Is this a change from baseline?: Change from baseline, expected to last <3 days Toileting: Needs assistance Is this a change from baseline?: Change from baseline, expected to last <3 days In/Out Bed: Needs assistance Is this a change from baseline?: Change from baseline, expected to last <3 days Walks in Home: Needs assistance Is this a change from baseline?: Change from baseline, expected to last <3 days Does the patient have difficulty walking or climbing stairs?: Yes Weakness of Legs: Both Weakness of Arms/Hands: None  Permission Sought/Granted                  Emotional Assessment  Orientation: : Oriented to Self, Oriented to Place, Oriented to  Time, Oriented to Situation Alcohol / Substance Use: Not Applicable Psych Involvement: No (comment)  Admission diagnosis:  Melena [K92.1] Generalized weakness [R53.1] Gastrointestinal hemorrhage, unspecified gastrointestinal hemorrhage type [K92.2] Anemia, unspecified type [D64.9] Acute on chronic blood loss anemia [D62] Patient Active Problem List   Diagnosis Date Noted   Melena 07/18/2021   Acute on chronic blood loss anemia  07/18/2021   Pressure injury of skin 06/21/2021   Symptomatic anemia 06/20/2021   Sepsis ([place]) 06/20/2021   HLD (hyperlipidemia) 06/20/2021   Fall at home, initial encounter 06/20/2021   Cough 06/20/2021   Chronic diastolic CHF (congestive heart failure) ([place]) 06/20/2021   GI bleeding 06/20/2021   Cellulitis of left lower leg 06/20/2021   Acute bilateral deep vein thrombosis (DVT) of femoral veins ([place]) 01/02/2021   DOE (dyspnea on exertion) 11/10/2020   Risk factors for obstructive sleep apnea 11/10/2020   Uterine cancer ([place]) 11/08/2020   IDA (iron deficiency anemia) 09/21/2020   Endometrial cancer ([place]) 09/13/2020   Acute blood loss anemia 09/06/2020   Vaginal bleeding 09/06/2020   DVT (deep venous thrombosis) ([place]) 09/06/2020   Acute massive pulmonary embolism ([place]) 08/25/2020   Anemia, unspecified 08/24/2020   Morbidly obese ([place]) 08/23/2020   Essential hypertension 08/23/2020   Diabetes mellitus without complication ([place]) 16/04/9603   Dyslipidemia 08/23/2020   Acute respiratory failure with hypoxia ([place])    Pulmonary embolism ([place]) 08/18/2020   Vaginal bleeding, abnormal 08/18/2020   Obesity, morbid, BMI 50 or higher ([place]) 08/18/2020   PCP:  [facility] [place]:   [id number] [address], [place] [address] [place] [address] Phone: [phone] Fax: [phone]     Social Determinants of Health (SDOH) Interventions    Readmission Risk Interventions Readmission Risk Prevention Plan 07/19/2021 06/23/2021  Transportation Screening Complete Complete  PCP or Specialist Appt within 3-5 Days - Complete  HRI or Home Care Consult Complete -  Social Work Consult for [place] Planning/Counseling Complete Complete  Palliative Care Screening Not Applicable Not Applicable  Medication Review Press photographer) Complete Complete  Some recent data might be hidden

## 2021-07-19 NOTE — Progress Notes (Signed)
OT Cancellation Note  Patient Details Name: Courtney Mack MRN: [id number] DOB: 1955/07/24   Cancelled Treatment:    Reason Eval/Treat Not Completed: Patient declined, no reason specified. Upon arrival pt sleeping soundly, awakes to voice and defers session this date citing poor rest last night. Will follow up next day as able to initiate services.   Dessie Coma, M.S. OTR/L  07/19/21, 3:00 PM  ascom [phone]

## 2021-07-19 NOTE — Progress Notes (Signed)
Initial Nutrition Assessment  DOCUMENTATION CODES:   Morbid obesity  INTERVENTION:   Ensure Enlive po TID, each supplement provides 350 kcal and 20 grams of protein  MVI po daily   Pt at moderate refeed risk; recommend monitor potassium, magnesium and phosphorus labs daily until stable  NUTRITION DIAGNOSIS:   Inadequate oral intake related to acute illness as evidenced by per patient/family report.  GOAL:   Patient will meet greater than or equal to 90% of their needs  MONITOR:   PO intake, Supplement acceptance, Labs, Skin, I & O's, Weight trends  REASON FOR ASSESSMENT:   Malnutrition Screening Tool    ASSESSMENT:   66 y.o. female with medical history significant of DVT/PE on Xarelto, hypertension, hyperlipidemia, diabetes mellitus, anemia, obesity, endometrial cancer, dCHF, morbid obesity and recent admission for cellulitis, sepsis and anemia who is now admitted with anemia and suspected GIB.  Pt s/p nuclear medicine bleeding scan- pt found to have lower GI bleed at splenic flexure and is now s/p endovascular embolization on 1/11.  Met with pt in room today. Pt is well known to this RD from a recent previous admission. Pt reports continued decreased appetite and oral intake pta. Pt reports that she mainly is just snacking throughout the day. Pt reports that she does not drink supplements at home. Per chart, pt is down 22lbs(9%) since her last admission; this is significant weight loss. RD suspects some weight loss may be r/t  fluid changes. Pt reports that she ate broth for breakfast this morning. Pt has now been advanced a GI soft diet. RD discussed with pt the importance of adequate nutrition needed to preserve lean muscle. Pt is willing to drink chocolate Ensure in hospital. Pt is likely at refeed risk.   Medications reviewed and include: protonix, simethicone  Labs reviewed: K 3.2(L), P 3.0 wnl, Mg 1.5(L) Hgb 8.0(L), Hct 25.4(L)  NUTRITION - FOCUSED PHYSICAL  EXAM:  Flowsheet Row Most Recent Value  Orbital Region No depletion  Upper Arm Region No depletion  Thoracic and Lumbar Region No depletion  Buccal Region No depletion  Temple Region No depletion  Clavicle Bone Region No depletion  Clavicle and Acromion Bone Region No depletion  Scapular Bone Region No depletion  Dorsal Hand No depletion  Patellar Region No depletion  Anterior Thigh Region No depletion  Posterior Calf Region No depletion  Edema (RD Assessment) Severe  Hair Reviewed  Eyes Reviewed  Mouth Reviewed  Skin Reviewed  Nails Reviewed   Diet Order:   Diet Order             DIET SOFT Room service appropriate? Yes; Fluid consistency: Thin  Diet effective now                  EDUCATION NEEDS:   Education needs have been addressed  Skin:  Skin Assessment: Reviewed RN Assessment  Last BM:  1/12- TYPE 7  Height:   Ht Readings from Last 1 Encounters:  07/18/21 _0  (1.499 m)    Weight:   Wt Readings from Last 1 Encounters:  07/19/21 99.4 kg    Ideal Body Weight:  44.5 kg  BMI:  Body mass index is 44.26 kg/m.  Estimated Nutritional Needs:   Kcal:  1700-2000kcal/day  Protein:  85-100g/day  Fluid:  1.3-1.5L/day  Koleen Distance MS, RD, LDN Please refer to [facility] for RD and/or RD on-call/weekend/after hours pager

## 2021-07-19 NOTE — Plan of Care (Signed)
  Problem: Pain Managment: Goal: General experience of comfort will improve Outcome: Progressing   

## 2021-07-19 NOTE — Progress Notes (Signed)
PT Cancellation Note  Patient Details Name: Courtney Mack MRN: [id number] DOB: 09/20/1955   Cancelled Treatment:    Reason Eval/Treat Not Completed: Other (comment). Pt sleeping soundly. PT to re-attempt as able.   Lieutenant Diego PT, DPT 2:48 PM,07/19/21

## 2021-07-19 NOTE — Progress Notes (Signed)
[facility]  Daily Progress Note  Procedure(s) Performed:             1.  Ultrasound guidance for vascular access right femoral artery             2.  Catheter placement into splenic flexure branch of the IMA             3.  Aortogram and selective angiogram of the IMA as well as direct injection of the splenic flexure branch of the IMA             4.  Microbead embolization of the main splenic flexure branch of the IMA with 1.5 cc of 500-700  polyvinyl alcohol beads as well as microbead embolization of the 2 nearby branches to the splenic flexure also feeding the area with 0.5 cc of 500 700 m polyvinyl alcohol beads in each branch             5.  StarClose closure device right femoral artery  Subjective  -   Patient without complaints this afternoon.  No complaints overnight.  Objective Vitals:   07/19/21 0400 07/19/21 0407 07/19/21 0500 07/19/21 0806  BP:  (!) 163/65  (!) 149/64  Pulse:  (!) 103  (!) 105  Resp:  20  20  Temp:  97.7 F (36.5 C)  98.8 F (37.1 C)  TempSrc:  Oral  Oral  SpO2:  95%  93%  Weight: 101.8 kg  99.4 kg   Height:        Intake/Output Summary (Last 24 hours) at 07/19/2021 1452 Last data filed at 07/19/2021 1423 Gross per 24 hour  Intake 842 ml  Output 200 ml  Net 642 ml    PULM  CTAB CV  RRR VASC  groin site clean dry and intact.  Soft to palpation.  No evidence of hematoma or bruising  Laboratory CBC    Component Value Date/Time   WBC 7.6 07/19/2021 0354   HGB 8.4 (L) 07/19/2021 1251   HCT 25.4 (L) 07/19/2021 0354   PLT 231 07/19/2021 0354    BMET    Component Value Date/Time   NA 138 07/19/2021 0354   K 3.2 (L) 07/19/2021 0354   CL 105 07/19/2021 0354   CO2 23 07/19/2021 0354   GLUCOSE 97 07/19/2021 0354   BUN 19 07/19/2021 0354   CREATININE 0.55 07/19/2021 0354   CALCIUM 8.4 (L) 07/19/2021 0354   GFRNONAA >60 07/19/2021 0354    Assessment/Planning: POD #1 s/p visceral embolization  The patient's  hemoglobin has shown improvement this morning.  We will continue to follow to ensure hemoglobin remained stable postintervention.  Otherwise, no further medication indicated from vascular.   Kris Hartmann  07/19/2021, 2:52 PM

## 2021-07-20 LAB — HEMOGLOBIN
Hemoglobin: 8.8 g/dL — ABNORMAL LOW (ref 12.0–15.0)
Hemoglobin: 9.2 g/dL — ABNORMAL LOW (ref 12.0–15.0)

## 2021-07-20 MED ORDER — RIVAROXABAN 20 MG PO TABS
20.0000 mg | ORAL_TABLET | Freq: Every day | ORAL | Status: DC
  Administered 2021-07-20 – 2021-07-22 (×3): 20 mg via ORAL
  Filled 2021-07-20 (×4): qty 1

## 2021-07-20 NOTE — Care Management Important Message (Signed)
Important Message  Patient Details  Name: Courtney Mack MRN: [id number] Date of Birth: [birth year]   Medicare Important Message Given:  Yes     [name] 07/20/2021, 1:41 PM

## 2021-07-20 NOTE — Evaluation (Signed)
Occupational Therapy Evaluation Patient Details Name: Courtney Mack MRN: [id number] DOB: 1956-06-25 Today's Date: 07/20/2021   History of Present Illness Courtney Mack is a 66 y.o. female with medical history significant of DVT/PE on Xarelto, hypertension, hyperlipidemia, diabetes mellitus, anemia, obesity, endometrial cancer, dCHF, morbid obesity, who presents with weakness, cough, fever and left lower  leg pain.   Clinical Impression   Ms Conkel was seen for OT evaluation this date. Prior to hospital admission, pt was MOD I for mobility and ADLs. Pt lives with family available PRN. Pt presents to acute OT demonstrating impaired ADL performance and functional mobility 2/2 decreased activity tolerance and functional strength/ROM/balance deficits. Pt currently requires SETUP don shoes sitting EOC. SBA + RW for ADL t/f - increased time/effort to rise from chair. Requires single UE support for standing grooming tasks. Pt would benefit from skilled OT to address noted impairments and functional limitations (see below for any additional details). Upon hospital discharge, recommend HHOT to maximize pt safety and return to PLOF.       Recommendations for follow up therapy are one component of a multi-disciplinary discharge planning process, led by the attending physician.  Recommendations may be updated based on patient status, additional functional criteria and insurance authorization.   Follow Up Recommendations  Home health OT    Assistance Recommended at Discharge Set up Supervision/Assistance  Patient can return home with the following A little help with bathing/dressing/bathroom    Functional Status Assessment  Patient has had a recent decline in their functional status and demonstrates the ability to make significant improvements in function in a reasonable and predictable amount of time.  Equipment Recommendations  None recommended by OT    Recommendations for Other Services        Precautions / Restrictions Precautions Precautions: Fall Restrictions Weight Bearing Restrictions: No      Mobility Bed Mobility Overal bed mobility: Needs Assistance Bed Mobility: Sit to Supine       Sit to supine: Min assist        Transfers Overall transfer level: Needs assistance Equipment used: Rolling walker (2 wheels) Transfers: Sit to/from Stand Sit to Stand: Supervision                  Balance Overall balance assessment: Needs assistance Sitting-balance support: No upper extremity supported;Feet supported Sitting balance-Leahy Scale: Normal     Standing balance support: During functional activity;Single extremity supported Standing balance-Leahy Scale: Good                             ADL either performed or assessed with clinical judgement   ADL Overall ADL's : Needs assistance/impaired                                       General ADL Comments: SETUP don shoes sitting EOC. SBA + RW for ADL t/f - increased time/effort to rise from chair. Requires single UE support for standing grooming tasks      Pertinent Vitals/Pain Pain Assessment: No/denies pain Pain Score: 0-No pain Pain Intervention(s): Limited activity within patient's tolerance;Monitored during session;Repositioned     Hand Dominance     Extremity/Trunk Assessment Upper Extremity Assessment Upper Extremity Assessment: Overall WFL for tasks assessed   Lower Extremity Assessment Lower Extremity Assessment: Generalized weakness       Communication Communication Communication: No difficulties  Cognition Arousal/Alertness: Awake/alert Behavior During Therapy: WFL for tasks assessed/performed Overall Cognitive Status: Within Functional Limits for tasks assessed                                                  Home Living Family/patient expects to be discharged to:: Private residence Living Arrangements:  Children Available Help at Discharge: Family;Available 24 hours/day Type of Home: House Home Access: Ramped entrance     Home Layout: One level     Bathroom Shower/Tub: Occupational psychologist: Handicapped height     Home Equipment: Conservation officer, nature (2 wheels);BSC/3in1          Prior Functioning/Environment Prior Level of Function : Independent/Modified Independent             Mobility Comments: MOD I mobility using RW, PRN assist from daughter for dressing          OT Problem List: Decreased strength;Decreased activity tolerance      OT Treatment/Interventions: Self-care/ADL training;Therapeutic exercise;Energy conservation;DME and/or AE instruction;Therapeutic activities;Patient/family education;Balance training    OT Goals(Current goals can be found in the care plan section) Acute Rehab OT Goals Patient Stated Goal: to go home OT Goal Formulation: With patient Time For Goal Achievement: 08/03/21 Potential to Achieve Goals: Good ADL Goals Pt Will Perform Grooming: with modified independence;standing Pt Will Perform Lower Body Dressing: with modified independence;sit to/from stand Pt Will Transfer to Toilet: with modified independence;ambulating;regular height toilet  OT Frequency: Min 2X/week    Co-evaluation              AM-PAC OT "6 Clicks" Daily Activity     Outcome Measure Help from another person eating meals?: None Help from another person taking care of personal grooming?: A Little Help from another person toileting, which includes using toliet, bedpan, or urinal?: A Little Help from another person bathing (including washing, rinsing, drying)?: A Little Help from another person to put on and taking off regular upper body clothing?: None Help from another person to put on and taking off regular lower body clothing?: A Little 6 Click Score: 20   End of Session Equipment Utilized During Treatment: Rolling walker (2 wheels) Nurse  Communication: Mobility status  Activity Tolerance: Patient tolerated treatment well Patient left: in bed;with call bell/phone within reach;with bed alarm set  OT Visit Diagnosis: Unsteadiness on feet (R26.81)                Time: 6060-0459 OT Time Calculation (min): 11 min Charges:  OT General Charges $OT Visit: 1 Visit OT Evaluation $OT Eval Low Complexity: 1 Low  Dessie Coma, M.S. OTR/L  07/20/21, 1:06 PM  ascom [phone]

## 2021-07-20 NOTE — Evaluation (Signed)
Physical Therapy Evaluation Patient Details Name: Courtney Mack MRN: [id number] DOB: 07-Mar-1956 Today's Date: 07/20/2021  History of Present Illness  Courtney Mack is a 66 y.o. female with medical history significant of DVT/PE on Xarelto, hypertension, hyperlipidemia, diabetes mellitus, anemia, obesity, endometrial cancer, dCHF, morbid obesity, who presents with weakness, cough, fever and left lower  leg pain.     Clinical Impression  Patient was A&Ox4 and resting in bed upon arrival. Pt reports no significant increase in pain today and states she is feeling better. Pt reports she lives in a 1-story home and her daughter helps take care of her on a daily basis, as well as her grand children who range in age with the oldest being 67 yo.   Pt was able to perform bed mobility w/ modI, progressing to sit to stand w/ modI & RW. Pt was able to maintain standing balance w/ modI to put on her adult diaper brief before transitioning to ambulation. Pt's SpO2 was taken and was ~87% at rest on room air, in which the patient was then instructed on deep breathing technique to increase SpO2 level before beginning ambulation, increased to ~92%. Pt was able to ambulation 25 ft w/ CGA and RW w/o any increase in pain or negative symptoms twice. Pt will benefit from continued skilled PT to improve LE strength, mobility, and gait to restore PLOF. Discharge recommendations at this time are to home health PT with PRN assistance.     Recommendations for follow up therapy are one component of a multi-disciplinary discharge planning process, led by the attending physician.  Recommendations may be updated based on patient status, additional functional criteria and insurance authorization.  Follow Up Recommendations Home health PT    Assistance Recommended at Discharge PRN  Patient can return home with the following  A little help with walking and/or transfers;A little help with  bathing/dressing/bathroom;Assistance with cooking/housework;Help with stairs or ramp for entrance;Assist for transportation    Equipment Recommendations None recommended by PT  Recommendations for Other Services       Functional Status Assessment Patient has had a recent decline in their functional status and demonstrates the ability to make significant improvements in function in a reasonable and predictable amount of time.     Precautions / Restrictions Precautions Precautions: Fall Restrictions Weight Bearing Restrictions: No      Mobility  Bed Mobility Overal bed mobility: Modified Independent                  Transfers Overall transfer level: Modified independent Equipment used: Rolling walker (2 wheels)                    Ambulation/Gait Ambulation/Gait assistance: Modified independent (Device/Increase time);Min guard Gait Distance (Feet): 25 Feet Assistive device: Rolling walker (2 wheels) Gait Pattern/deviations: Step-through pattern;Decreased step length - right;Decreased step length - left Gait velocity: Decreased velocity        Stairs            Wheelchair Mobility    Modified Rankin (Stroke Patients Only)       Balance Overall balance assessment: Modified Independent                                           Pertinent Vitals/Pain Pain Assessment: 0-10 Pain Score: 0-No pain Pain Intervention(s): Limited activity within patient's tolerance;Monitored during session;Repositioned  Home Living Family/patient expects to be discharged to:: Private residence Living Arrangements: Children Available Help at Discharge: Family;Available 24 hours/day Type of Home: House Home Access: Ramped entrance       Home Layout: One level Home Equipment: Conservation officer, nature (2 wheels);BSC/3in1      Prior Function Prior Level of Function : Independent/Modified Independent             Mobility Comments: MOD I mobility  using RW, PRN assist from daughter for dressing       Hand Dominance        Extremity/Trunk Assessment                Communication   Communication: No difficulties  Cognition Arousal/Alertness: Awake/alert Behavior During Therapy: WFL for tasks assessed/performed Overall Cognitive Status: Within Functional Limits for tasks assessed                                          General Comments      Exercises     Assessment/Plan    PT Assessment Patient needs continued PT services  PT Problem List Decreased strength;Decreased range of motion;Decreased mobility;Decreased coordination;Decreased activity tolerance;Decreased balance;Obesity;Pain       PT Treatment Interventions Gait training;Functional mobility training;Therapeutic activities;Patient/family education;Balance training;Therapeutic exercise;Stair training    PT Goals (Current goals can be found in the Care Plan section)  Acute Rehab PT Goals Patient Stated Goal: To go home PT Goal Formulation: With patient Time For Goal Achievement: 08/03/21 Potential to Achieve Goals: Good    Frequency Min 2X/week     Co-evaluation               AM-PAC PT "6 Clicks" Mobility  Outcome Measure Help needed turning from your back to your side while in a flat bed without using bedrails?: None Help needed moving from lying on your back to sitting on the side of a flat bed without using bedrails?: A Little Help needed moving to and from a bed to a chair (including a wheelchair)?: A Little Help needed standing up from a chair using your arms (e.g., wheelchair or bedside chair)?: A Little Help needed to walk in hospital room?: A Little Help needed climbing 3-5 steps with a railing? : Total 6 Click Score: 17    End of Session Equipment Utilized During Treatment: Gait belt Activity Tolerance: Patient tolerated treatment well;No increased pain;Patient limited by fatigue Patient left: in chair;with  call bell/phone within reach;with chair alarm set Nurse Communication: Mobility status PT Visit Diagnosis: Unsteadiness on feet (R26.81);Other abnormalities of gait and mobility (R26.89);Repeated falls (R29.6);Muscle weakness (generalized) (M62.81);History of falling (Z91.81)    Time: 0921-1007 PT Time Calculation (min) (ACUTE ONLY): 46 min   Charges:   PT Evaluation $PT Eval Low Complexity: 1 Low PT Treatments $Gait Training: 8-22 mins $Therapeutic Exercise: 8-22 mins $Therapeutic Activity: 8-22 mins       Lieutenant Diego PT, DPT 2:54 PM,07/20/21

## 2021-07-20 NOTE — Progress Notes (Signed)
[facility]  Daily Progress Note   Subjective  -   Patient doing well.  No further bloody bowel movements.  No abdominal pain.  No major events  Objective Vitals:   07/20/21 0330 07/20/21 0500 07/20/21 0754 07/20/21 0858  BP: (!) 118/52  (!) 101/46   Pulse: (!) 103  98   Resp: 16  20   Temp: 98.8 F (37.1 C)  98.4 F (36.9 C)   TempSrc: Oral  Oral   SpO2: 91%  (!) 89% 93%  Weight:  100.8 kg    Height:        Intake/Output Summary (Last 24 hours) at 07/20/2021 1336 Last data filed at 07/20/2021 1300 Gross per 24 hour  Intake 806 ml  Output 1100 ml  Net -294 ml    PULM  CTAB CV  Slightly tachycardic VASC  access site is clean, dry, and intact  Laboratory CBC    Component Value Date/Time   WBC 7.6 07/19/2021 0354   HGB 9.2 (L) 07/20/2021 1200   HCT 25.4 (L) 07/19/2021 0354   PLT 231 07/19/2021 0354    BMET    Component Value Date/Time   NA 138 07/19/2021 0354   K 3.2 (L) 07/19/2021 0354   CL 105 07/19/2021 0354   CO2 23 07/19/2021 0354   GLUCOSE 97 07/19/2021 0354   BUN 19 07/19/2021 0354   CREATININE 0.55 07/19/2021 0354   CALCIUM 8.4 (L) 07/19/2021 0354   GFRNONAA >60 07/19/2021 0354    Assessment/Planning: POD #2 s/p embolization of a splenic flexure branch of the IMA  Doing well.  No further bleeding. Hemoglobin has been stable Have discussed with the primary service and I think it is okay to resume anticoagulation at this point Will sign off, please call with questions   Leotis Pain  07/20/2021, 1:36 PM

## 2021-07-20 NOTE — Progress Notes (Signed)
PROGRESS NOTE    DOROTHEY OETKEN  KWI:[id number] DOB: 1955/08/07 DOA: 07/18/2021 PCP: [facility] [place]    Brief Narrative:   66 year old female who presents for evaluation of acute on chronic anemia associated with GI bleed.  Patient had a recent admission in December 2022 where she underwent upper and lower endoscopy as well as a video capsule study.  All these were negative for bleeding source.  Patient was restarted on her anticoagulation and discharged home.  She presents again with hemoglobin of 4.8.   Nuclear medicine bleeding scan on 1/11 is positive for lower GI bleed at splenic flexure.  Vascular surgery is consulted.  Status post endovascular embolization on 1/11.  Tolerated procedure well.  Post procedurally patient remained stable.  Hemoglobin stable and trending upwards  Assessment & Plan:   Principal Problem:   Melena Active Problems:   Acute on chronic blood loss anemia  GI bleed Acute on chronic blood loss anemia Status post Kcentra in ED Hemoglobin on presentation 4.8 Received 3 units PRBC so far NM bleeding scan positive for GI bleed at splenic flexure Status postembolization with vascular surgery on 1/11 Hemoglobin post procedure has been stable Plan: Resume Xarelto today, discussed with vascular surgery Continue trending hemoglobin Advance diet as tolerated   Hx of Pulmonary embolism and DVT:  -Pt is s/p of IVC filter on 08/18/20. -High risk DVT -Attempted restart Xarelto 1/13 -Monitor closely for bleeding   Diabetes mellitus without complication ([place]):  -Sliding scale insulin coverage   HTN: -hold Cozaar since patient is at high risk of developing hypotension due to severe anemia -IV hydralazine as needed   Chronic diastolic CHF (congestive heart failure) ([place]):  -2D echo on 08/19/2020 showed EF 60 to 65% with grade 1 diastolic dysfunction.   -Chest x-ray is negative for pulmonary edema, does not seem to have CHF  exacerbation. -W\ill give as needed Lasix while patient is getting blood transfusion   DVT prophylaxis: SCD Code Status: Full Family Communication: None Disposition Plan: Status is: Inpatient  Remains inpatient appropriate because: Severe acute on chronic anemia.  GI bleed.  Status post embolization.  Restarting Xarelto today.  Possible discharge in 24 to 48 hours if hemoglobin remains stable      Level of care: Med-Surg  Consultants:  Vascular surgery  Procedures:  Endovascular embolization 1/11  Antimicrobials: None   Subjective: Examined.  Resting in bed.  No visible distress.  Complains of loose stools.  Objective: Vitals:   07/20/21 0330 07/20/21 0500 07/20/21 0754 07/20/21 0858  BP: (!) 118/52  (!) 101/46   Pulse: (!) 103  98   Resp: 16  20   Temp: 98.8 F (37.1 C)  98.4 F (36.9 C)   TempSrc: Oral  Oral   SpO2: 91%  (!) 89% 93%  Weight:  100.8 kg    Height:        Intake/Output Summary (Last 24 hours) at 07/20/2021 1312 Last data filed at 07/20/2021 1300 Gross per 24 hour  Intake 806 ml  Output 1100 ml  Net -294 ml   Filed Weights   07/19/21 0400 07/19/21 0500 07/20/21 0500  Weight: 101.8 kg 99.4 kg 100.8 kg    Examination:  General exam: No acute distress Respiratory system: Normal respiratory effort.  Room air.  Lungs clear Cardiovascular system: S1-S2, RRR, no murmurs, no pedal edema Gastrointestinal system: Soft, NT/ND, normal bowel sounds Central nervous system: Alert and oriented. No focal neurological deficits. Extremities: Symmetric 5 x 5 power.  Skin: Pale, no obvious rashes or lesions Psychiatry: Judgement and insight appear normal. Mood & affect appropriate.     Data Reviewed: I have personally reviewed following labs and imaging studies  CBC: Recent Labs  Lab 07/18/21 0201 07/18/21 1120 07/19/21 0354 07/19/21 1251 07/19/21 2020 07/20/21 0405 07/20/21 1200  WBC 5.7 5.3 7.6  --   --   --   --   HGB 4.8* 5.9* 8.0* 8.4*  7.8* 8.8* 9.2*  HCT 17.3* 19.6* 25.4*  --   --   --   --   MCV 84.4 82.4 81.2  --   --   --   --   PLT 269 208 231  --   --   --   --    Basic Metabolic Panel: Recent Labs  Lab 07/18/21 0201 07/19/21 0354  NA 136 138  K 3.7 3.2*  CL 104 105  CO2 26 23  GLUCOSE 99 97  BUN 26* 19  CREATININE 0.69 0.55  CALCIUM 9.0 8.4*  MG  --  1.5*  PHOS  --  3.0   GFR: Estimated Creatinine Clearance: 72.3 mL/min (by C-G formula based on SCr of 0.55 mg/dL). Liver Function Tests: No results for input(s): AST, ALT, ALKPHOS, BILITOT, PROT, ALBUMIN in the last 168 hours. No results for input(s): LIPASE, AMYLASE in the last 168 hours. No results for input(s): AMMONIA in the last 168 hours. Coagulation Profile: No results for input(s): INR, PROTIME in the last 168 hours. Cardiac Enzymes: No results for input(s): CKTOTAL, CKMB, CKMBINDEX, TROPONINI in the last 168 hours. BNP (last 3 results) No results for input(s): PROBNP in the last 8760 hours. HbA1C: No results for input(s): HGBA1C in the last 72 hours. CBG: Recent Labs  Lab 07/18/21 0245 07/18/21 1509 07/18/21 1721  GLUCAP 96 89 111*   Lipid Profile: No results for input(s): CHOL, HDL, LDLCALC, TRIG, CHOLHDL, LDLDIRECT in the last 72 hours. Thyroid Function Tests: No results for input(s): TSH, T4TOTAL, FREET4, T3FREE, THYROIDAB in the last 72 hours. Anemia Panel: No results for input(s): VITAMINB12, FOLATE, FERRITIN, TIBC, IRON, RETICCTPCT in the last 72 hours. Sepsis Labs: No results for input(s): PROCALCITON, LATICACIDVEN in the last 168 hours.  Recent Results (from the past 240 hour(s))  Resp Panel by RT-PCR (Flu A&B, Covid) Nasopharyngeal Swab     Status: None   Collection Time: 07/18/21  2:01 AM   Specimen: Nasopharyngeal Swab; Nasopharyngeal(NP) swabs in vial transport medium  Result Value Ref Range Status   SARS Coronavirus 2 by RT PCR NEGATIVE NEGATIVE Final    Comment: (NOTE) SARS-CoV-2 target nucleic acids are NOT  DETECTED.  The SARS-CoV-2 RNA is generally detectable in upper respiratory specimens during the acute phase of infection. The lowest concentration of SARS-CoV-2 viral copies this assay can detect is 138 copies/mL. A negative result does not preclude SARS-Cov-2 infection and should not be used as the sole basis for treatment or other patient management decisions. A negative result may occur with  improper specimen collection/handling, submission of specimen other than nasopharyngeal swab, presence of viral mutation(s) within the areas targeted by this assay, and inadequate number of viral copies(<138 copies/mL). A negative result must be combined with clinical observations, patient history, and epidemiological information. The expected result is Negative.  Fact Sheet for Patients:  EntrepreneurPulse.com.au  Fact Sheet for Healthcare Providers:  IncredibleEmployment.be  This test is no t yet approved or cleared by the [place] FDA and  has been authorized for detection and/or diagnosis of  SARS-CoV-2 by FDA under an Emergency Use Authorization (EUA). This EUA will remain  in effect (meaning this test can be used) for the duration of the COVID-19 declaration under Section 564(b)(1) of the Act, 21 U.S.C.section 360bbb-3(b)(1), unless the authorization is terminated  or revoked sooner.       Influenza A by PCR NEGATIVE NEGATIVE Final   Influenza B by PCR NEGATIVE NEGATIVE Final    Comment: (NOTE) The Xpert Xpress SARS-CoV-2/FLU/RSV plus assay is intended as an aid in the diagnosis of influenza from Nasopharyngeal swab specimens and should not be used as a sole basis for treatment. Nasal washings and aspirates are unacceptable for Xpert Xpress SARS-CoV-2/FLU/RSV testing.  Fact Sheet for Patients: EntrepreneurPulse.com.au  Fact Sheet for Healthcare Providers: IncredibleEmployment.be  This test is not yet  approved or cleared by the [place] FDA and has been authorized for detection and/or diagnosis of SARS-CoV-2 by FDA under an Emergency Use Authorization (EUA). This EUA will remain in effect (meaning this test can be used) for the duration of the COVID-19 declaration under Section 564(b)(1) of the Act, 21 U.S.C. section 360bbb-3(b)(1), unless the authorization is terminated or revoked.  Performed at [facility], [address] [place], [place] [address]          Radiology Studies: NM GI Blood Loss  Result Date: 07/18/2021 CLINICAL DATA:  Lower GI bleeding EXAM: NUCLEAR MEDICINE GASTROINTESTINAL BLEEDING SCAN TECHNIQUE: Sequential abdominal images were obtained following intravenous administration of Tc-28m labeled red blood cells. RADIOPHARMACEUTICALS:  21.3 mCi Tc-73m pertechnetate in-vitro labeled red cells. COMPARISON:  06/10/2021 FINDINGS: Within the first hour of imaging, there is peristalsing activity beginning in the left upper quadrant and progressing into the rectum compatible with active GI bleeding in the splenic flexure of the colon. Second hour of imaging was not performed. IMPRESSION: Positive exam for lower GI bleeding in the splenic flexure of the colon. These results will be called to the ordering clinician or representative by the Radiologist Assistant, and communication documented in the PACS or [employer]. Electronically Signed   By: Jerilynn Mages.  Shick M.D.   On: 07/18/2021 13:36   PERIPHERAL VASCULAR CATHETERIZATION  Result Date: 07/18/2021 See surgical note for result.       Scheduled Meds:  dicyclomine  20 mg Oral TID AC   feeding supplement  237 mL Oral TID BM   mometasone-formoterol  2 puff Inhalation BID   multivitamin with minerals  1 tablet Oral Daily   pantoprazole (PROTONIX) IV  40 mg Intravenous Q12H   simethicone  80 mg Oral QID   Continuous Infusions:  sodium chloride Stopped (07/18/21 0826)     LOS: 2 days    Time spent: 35  minutes    Sidney Ace, MD [facility]   If 7PM-7AM, please contact night-coverage  07/20/2021, 1:12 PM

## 2021-07-21 LAB — CBC WITH DIFFERENTIAL/PLATELET
Abs Immature Granulocytes: 0.04 10*3/uL (ref 0.00–0.07)
Basophils Absolute: 0 10*3/uL (ref 0.0–0.1)
Basophils Relative: 0 %
Eosinophils Absolute: 0.3 10*3/uL (ref 0.0–0.5)
Eosinophils Relative: 3 %
HCT: 27.4 % — ABNORMAL LOW (ref 36.0–46.0)
Hemoglobin: 8.5 g/dL — ABNORMAL LOW (ref 12.0–15.0)
Immature Granulocytes: 1 %
Lymphocytes Relative: 8 %
Lymphs Abs: 0.7 10*3/uL (ref 0.7–4.0)
MCH: 25.9 pg — ABNORMAL LOW (ref 26.0–34.0)
MCHC: 31 g/dL (ref 30.0–36.0)
MCV: 83.5 fL (ref 80.0–100.0)
Monocytes Absolute: 0.6 10*3/uL (ref 0.1–1.0)
Monocytes Relative: 6 %
Neutro Abs: 7.1 10*3/uL (ref 1.7–7.7)
Neutrophils Relative %: 82 %
Platelets: 206 10*3/uL (ref 150–400)
RBC: 3.28 MIL/uL — ABNORMAL LOW (ref 3.87–5.11)
RDW: 19.5 % — ABNORMAL HIGH (ref 11.5–15.5)
WBC: 8.6 10*3/uL (ref 4.0–10.5)
nRBC: 0 % (ref 0.0–0.2)

## 2021-07-21 LAB — HEMOGLOBIN: Hemoglobin: 8.2 g/dL — ABNORMAL LOW (ref 12.0–15.0)

## 2021-07-21 NOTE — Progress Notes (Signed)
PROGRESS NOTE    Courtney Mack  IRW:[id number] DOB: 1955-10-23 DOA: 07/18/2021 PCP: [facility]    Brief Narrative:   66 year old female who presents for evaluation of acute on chronic anemia associated with GI bleed.  Patient had a recent admission in December 2022 where she underwent upper and lower endoscopy as well as a video capsule study.  All these were negative for bleeding source.  Patient was restarted on her anticoagulation and discharged home.  She presents again with hemoglobin of 4.8.   Nuclear medicine bleeding scan on 1/11 is positive for lower GI bleed at splenic flexure.  Vascular surgery is consulted.  Status post endovascular embolization on 1/11.  Tolerated procedure well.  Post procedurally patient remained stable.  Hemoglobin stable and trending upwards  Assessment & Plan:   Principal Problem:   Melena Active Problems:   Acute on chronic blood loss anemia  GI bleed Acute on chronic blood loss anemia Status post Kcentra in ED Hemoglobin on presentation 4.8 Received 3 units PRBC so far NM bleeding scan positive for GI bleed at splenic flexure Status postembolization with vascular surgery on 1/11 Hemoglobin post procedure has been stable Xarelto restarted 1/13 Hemoglobin 8.5 on 1/14, slight downtrend.  No bleeding noted Plan: Continue Xarelto Check 1300 hemoglobin CBC a.m. tomorrow If hemoglobin stable possible discharge on 1/15   Hx of Pulmonary embolism and DVT:  -Pt is s/p of IVC filter on 08/18/20. -High risk DVT -Xarelto restarted 1/13 -Monitor closely for bleeding   Diabetes mellitus without complication [facility]):  -Sliding scale insulin coverage   HTN: -hold Cozaar since patient is at high risk of developing hypotension due to severe anemia -IV hydralazine as needed   Chronic diastolic CHF (congestive heart failure) ([place]):  -2D echo on 08/19/2020 showed EF 60 to 65% with grade 1 diastolic dysfunction.   -Chest  x-ray is negative for pulmonary edema, does not seem to have CHF exacerbation. -W\ill give as needed Lasix while patient is getting blood transfusion   DVT prophylaxis: SCD Code Status: Full Family Communication: None Disposition Plan: Status is: Inpatient  Remains inpatient appropriate because: Severe acute on chronic anemia.  GI bleed.  Status post embolization.  Restarted Xarelto 1/13.  Monitor for least the next 24 hours to ensure stability of hemoglobin and no further bleed.      Level of care: Med-Surg  Consultants:  Vascular surgery  Procedures:  Endovascular embolization 1/11  Antimicrobials: None   Subjective: Examined.  Resting in bed.  No visible distress.  No complaints.  States she is feeling well  Objective: Vitals:   07/20/21 1937 07/21/21 0219 07/21/21 0500 07/21/21 1012  BP: (!) 117/58 (!) 121/52  (!) 99/49  Pulse: 98 96  93  Resp: 18 18  18   Temp: 98 F (36.7 C) 98.9 F (37.2 C)  99 F (37.2 C)  TempSrc:      SpO2: 90% 90%  (!) 87%  Weight:   101.2 kg   Height:        Intake/Output Summary (Last 24 hours) at 07/21/2021 1106 Last data filed at 07/21/2021 1011 Gross per 24 hour  Intake 720 ml  Output --  Net 720 ml   Filed Weights   07/19/21 0500 07/20/21 0500 07/21/21 0500  Weight: 99.4 kg 100.8 kg 101.2 kg    Examination:  General exam: No acute distress Respiratory system: Lungs clear.  Normal work of breathing.  Room air Cardiovascular system: S1-S2, RRR, no murmurs, no  pedal edema Gastrointestinal system: Soft, NT/ND, normal bowel sounds Central nervous system: Alert and oriented. No focal neurological deficits. Extremities: Symmetric 5 x 5 power. Skin: Pale, no rashes or lesions Psychiatry: Judgement and insight appear normal. Mood & affect appropriate.     Data Reviewed: I have personally reviewed following labs and imaging studies  CBC: Recent Labs  Lab 07/18/21 0201 07/18/21 1120 07/19/21 0354 07/19/21 1251  07/19/21 2020 07/20/21 0405 07/20/21 1200 07/21/21 0550  WBC 5.7 5.3 7.6  --   --   --   --  8.6  NEUTROABS  --   --   --   --   --   --   --  7.1  HGB 4.8* 5.9* 8.0* 8.4* 7.8* 8.8* 9.2* 8.5*  HCT 17.3* 19.6* 25.4*  --   --   --   --  27.4*  MCV 84.4 82.4 81.2  --   --   --   --  83.5  PLT 269 208 231  --   --   --   --  657   Basic Metabolic Panel: Recent Labs  Lab 07/18/21 0201 07/19/21 0354  NA 136 138  K 3.7 3.2*  CL 104 105  CO2 26 23  GLUCOSE 99 97  BUN 26* 19  CREATININE 0.69 0.55  CALCIUM 9.0 8.4*  MG  --  1.5*  PHOS  --  3.0   GFR: Estimated Creatinine Clearance: 72.5 mL/min (by C-G formula based on SCr of 0.55 mg/dL). Liver Function Tests: No results for input(s): AST, ALT, ALKPHOS, BILITOT, PROT, ALBUMIN in the last 168 hours. No results for input(s): LIPASE, AMYLASE in the last 168 hours. No results for input(s): AMMONIA in the last 168 hours. Coagulation Profile: No results for input(s): INR, PROTIME in the last 168 hours. Cardiac Enzymes: No results for input(s): CKTOTAL, CKMB, CKMBINDEX, TROPONINI in the last 168 hours. BNP (last 3 results) No results for input(s): PROBNP in the last 8760 hours. HbA1C: No results for input(s): HGBA1C in the last 72 hours. CBG: Recent Labs  Lab 07/18/21 0245 07/18/21 1509 07/18/21 1721  GLUCAP 96 89 111*   Lipid Profile: No results for input(s): CHOL, HDL, LDLCALC, TRIG, CHOLHDL, LDLDIRECT in the last 72 hours. Thyroid Function Tests: No results for input(s): TSH, T4TOTAL, FREET4, T3FREE, THYROIDAB in the last 72 hours. Anemia Panel: No results for input(s): VITAMINB12, FOLATE, FERRITIN, TIBC, IRON, RETICCTPCT in the last 72 hours. Sepsis Labs: No results for input(s): PROCALCITON, LATICACIDVEN in the last 168 hours.  Recent Results (from the past 240 hour(s))  Resp Panel by RT-PCR (Flu A&B, Covid) Nasopharyngeal Swab     Status: None   Collection Time: 07/18/21  2:01 AM   Specimen: Nasopharyngeal Swab;  Nasopharyngeal(NP) swabs in vial transport medium  Result Value Ref Range Status   SARS Coronavirus 2 by RT PCR NEGATIVE NEGATIVE Final    Comment: (NOTE) SARS-CoV-2 target nucleic acids are NOT DETECTED.  The SARS-CoV-2 RNA is generally detectable in upper respiratory specimens during the acute phase of infection. The lowest concentration of SARS-CoV-2 viral copies this assay can detect is 138 copies/mL. A negative result does not preclude SARS-Cov-2 infection and should not be used as the sole basis for treatment or other patient management decisions. A negative result may occur with  improper specimen collection/handling, submission of specimen other than nasopharyngeal swab, presence of viral mutation(s) within the areas targeted by this assay, and inadequate number of viral copies(<138 copies/mL). A negative result must  be combined with clinical observations, patient history, and epidemiological information. The expected result is Negative.  Fact Sheet for Patients:  EntrepreneurPulse.com.au  Fact Sheet for Healthcare Providers:  IncredibleEmployment.be  This test is no t yet approved or cleared by the [place] FDA and  has been authorized for detection and/or diagnosis of SARS-CoV-2 by FDA under an Emergency Use Authorization (EUA). This EUA will remain  in effect (meaning this test can be used) for the duration of the COVID-19 declaration under Section 564(b)(1) of the Act, 21 U.S.C.section 360bbb-3(b)(1), unless the authorization is terminated  or revoked sooner.       Influenza A by PCR NEGATIVE NEGATIVE Final   Influenza B by PCR NEGATIVE NEGATIVE Final    Comment: (NOTE) The Xpert Xpress SARS-CoV-2/FLU/RSV plus assay is intended as an aid in the diagnosis of influenza from Nasopharyngeal swab specimens and should not be used as a sole basis for treatment. Nasal washings and aspirates are unacceptable for Xpert Xpress  SARS-CoV-2/FLU/RSV testing.  Fact Sheet for Patients: EntrepreneurPulse.com.au  Fact Sheet for Healthcare Providers: IncredibleEmployment.be  This test is not yet approved or cleared by the [place] FDA and has been authorized for detection and/or diagnosis of SARS-CoV-2 by FDA under an Emergency Use Authorization (EUA). This EUA will remain in effect (meaning this test can be used) for the duration of the COVID-19 declaration under Section 564(b)(1) of the Act, 21 U.S.C. section 360bbb-3(b)(1), unless the authorization is terminated or revoked.  Performed at [facility], [address], [place], [place] [address]          Radiology Studies: No results found.      Scheduled Meds:  dicyclomine  20 mg Oral TID AC   feeding supplement  237 mL Oral TID BM   mometasone-formoterol  2 puff Inhalation BID   multivitamin with minerals  1 tablet Oral Daily   pantoprazole (PROTONIX) IV  40 mg Intravenous Q12H   rivaroxaban  20 mg Oral Q supper   simethicone  80 mg Oral QID   Continuous Infusions:  sodium chloride Stopped (07/18/21 0826)     LOS: 3 days    Time spent: 35 minutes    Sidney Ace, MD [facility]   If 7PM-7AM, please contact night-coverage  07/21/2021, 11:06 AM

## 2021-07-22 LAB — TYPE AND SCREEN
ABO/RH(D): A POS
Antibody Screen: POSITIVE
Donor AG Type: NEGATIVE
Donor AG Type: NEGATIVE
Donor AG Type: NEGATIVE
Donor AG Type: NEGATIVE
Unit division: 0
Unit division: 0
Unit division: 0
Unit division: 0
Unit division: 0
Unit division: 0

## 2021-07-22 LAB — CBC WITH DIFFERENTIAL/PLATELET
Abs Immature Granulocytes: 0.02 10*3/uL (ref 0.00–0.07)
Basophils Absolute: 0 10*3/uL (ref 0.0–0.1)
Basophils Relative: 0 %
Eosinophils Absolute: 0.3 10*3/uL (ref 0.0–0.5)
Eosinophils Relative: 4 %
HCT: 29.4 % — ABNORMAL LOW (ref 36.0–46.0)
Hemoglobin: 8.8 g/dL — ABNORMAL LOW (ref 12.0–15.0)
Immature Granulocytes: 0 %
Lymphocytes Relative: 8 %
Lymphs Abs: 0.6 10*3/uL — ABNORMAL LOW (ref 0.7–4.0)
MCH: 26 pg (ref 26.0–34.0)
MCHC: 29.9 g/dL — ABNORMAL LOW (ref 30.0–36.0)
MCV: 87 fL (ref 80.0–100.0)
Monocytes Absolute: 0.4 10*3/uL (ref 0.1–1.0)
Monocytes Relative: 6 %
Neutro Abs: 5.3 10*3/uL (ref 1.7–7.7)
Neutrophils Relative %: 82 %
Platelets: 230 10*3/uL (ref 150–400)
RBC: 3.38 MIL/uL — ABNORMAL LOW (ref 3.87–5.11)
RDW: 19.3 % — ABNORMAL HIGH (ref 11.5–15.5)
WBC: 6.5 10*3/uL (ref 4.0–10.5)
nRBC: 0 % (ref 0.0–0.2)

## 2021-07-22 LAB — BPAM RBC
Blood Product Expiration Date: 202301192359
Blood Product Expiration Date: 202301192359
Blood Product Expiration Date: 202302022359
Blood Product Expiration Date: 202302032359
Blood Product Expiration Date: 202302062359
Blood Product Expiration Date: 202302112359
ISSUE DATE / TIME: 202301071800
ISSUE DATE / TIME: 202301110447
ISSUE DATE / TIME: 202301110818
ISSUE DATE / TIME: 202301112003
ISSUE DATE / TIME: 202301130005
Unit Type and Rh: 5100
Unit Type and Rh: 5100
Unit Type and Rh: 6200
Unit Type and Rh: 6200
Unit Type and Rh: 6200
Unit Type and Rh: 6200

## 2021-07-22 LAB — HEMOGLOBIN: Hemoglobin: 9.5 g/dL — ABNORMAL LOW (ref 12.0–15.0)

## 2021-07-22 NOTE — Plan of Care (Signed)
  Problem: Activity: Goal: Risk for activity intolerance will decrease Outcome: Progressing   Problem: Nutrition: Goal: Adequate nutrition will be maintained Outcome: Progressing   Problem: Pain Managment: Goal: General experience of comfort will improve Outcome: Progressing   Problem: Skin Integrity: Goal: Risk for impaired skin integrity will decrease Outcome: Progressing   

## 2021-07-22 NOTE — Progress Notes (Signed)
PROGRESS NOTE    Courtney Mack  VFI:[id number] DOB: Jul 26, 1955 DOA: 07/18/2021 PCP: [facility] [place]    Brief Narrative:   66 year old female who presents for evaluation of acute on chronic anemia associated with GI bleed.  Patient had a recent admission in December 2022 where she underwent upper and lower endoscopy as well as a video capsule study.  All these were negative for bleeding source.  Patient was restarted on her anticoagulation and discharged home.  She presents again with hemoglobin of 4.8.   Nuclear medicine bleeding scan on 1/11 is positive for lower GI bleed at splenic flexure.  Vascular surgery is consulted.  Status post endovascular embolization on 1/11.  Tolerated procedure well.  Post procedurally patient remained stable.  Hemoglobin stable and trending upwards.  Patient still with dark stools  Assessment & Plan:   Principal Problem:   Melena Active Problems:   Acute on chronic blood loss anemia  GI bleed Acute on chronic blood loss anemia Status post Kcentra in ED Hemoglobin on presentation 4.8 Received 3 units PRBC so far NM bleeding scan positive for GI bleed at splenic flexure Status postembolization with vascular surgery on 1/11 Hemoglobin post procedure has been stable Xarelto restarted 1/13 Hemoglobin 8.5 on 1/14, slight downtrend.   Stable after, still with dark stools, possibly old blood Plan: Continue Xarelto Check AM Hb If hemoglobin stable possible discharge on 1/16   Hx of Pulmonary embolism and DVT:  -Pt is s/p of IVC filter on 08/18/20. -High risk DVT -Xarelto restarted 1/13 -Monitor closely for bleeding   Diabetes mellitus without complication [facility]):  -Sliding scale insulin coverage   HTN: -hold Cozaar since patient is at high risk of developing hypotension due to severe anemia -IV hydralazine as needed   Chronic diastolic CHF (congestive heart failure) ([place]):  -2D echo on 08/19/2020 showed EF 60 to 65%  with grade 1 diastolic dysfunction.   -Chest x-ray is negative for pulmonary edema, does not seem to have CHF exacerbation. -W\ill give as needed Lasix while patient is getting blood transfusion   DVT prophylaxis: SCD Code Status: Full Family Communication: None Disposition Plan: Status is: Inpatient  Remains inpatient appropriate because: Severe acute on chronic anemia.  GI bleed.  Status post embolization.  Restarted Xarelto 1/13.Monitor overnight, tentative discharge on 1/16      Level of care: Med-Surg  Consultants:  Vascular surgery  Procedures:  Endovascular embolization 1/11  Antimicrobials: None   Subjective: Examined.  Resting in bed.  No visible distress.  No complaints.  States she is feeling well  Objective: Vitals:   07/22/21 0457 07/22/21 0500 07/22/21 0743 07/22/21 1508  BP: (!) 114/46  (!) 106/38 (!) 123/44  Pulse: 82  88 93  Resp: 20  18 18   Temp: 98.4 F (36.9 C)  98.9 F (37.2 C) 98.6 F (37 C)  TempSrc: Oral     SpO2: 94%  97% 94%  Weight:  104.1 kg    Height:        Intake/Output Summary (Last 24 hours) at 07/22/2021 1522 Last data filed at 07/22/2021 1358 Gross per 24 hour  Intake 720 ml  Output 800 ml  Net -80 ml   Filed Weights   07/20/21 0500 07/21/21 0500 07/22/21 0500  Weight: 100.8 kg 101.2 kg 104.1 kg    Examination:  General exam: No acute distress Respiratory system: Lungs clear.  Normal work of breathing.  Room air Cardiovascular system: S1-S2, RRR, no murmurs, no pedal edema  Gastrointestinal system: Soft, NT/ND, normal bowel sounds Central nervous system: Alert and oriented. No focal neurological deficits. Extremities: Symmetric 5 x 5 power. Skin: Pale, no rashes or lesions Psychiatry: Judgement and insight appear normal. Mood & affect appropriate.     Data Reviewed: I have personally reviewed following labs and imaging studies  CBC: Recent Labs  Lab 07/18/21 0201 07/18/21 1120 07/19/21 0354 07/19/21 1251  07/20/21 1200 07/21/21 0550 07/21/21 1259 07/22/21 0521 07/22/21 1309  WBC 5.7 5.3 7.6  --   --  8.6  --  6.5  --   NEUTROABS  --   --   --   --   --  7.1  --  5.3  --   HGB 4.8* 5.9* 8.0*   < > 9.2* 8.5* 8.2* 8.8* 9.5*  HCT 17.3* 19.6* 25.4*  --   --  27.4*  --  29.4*  --   MCV 84.4 82.4 81.2  --   --  83.5  --  87.0  --   PLT 269 208 231  --   --  206  --  230  --    < > = values in this interval not displayed.   Basic Metabolic Panel: Recent Labs  Lab 07/18/21 0201 07/19/21 0354  NA 136 138  K 3.7 3.2*  CL 104 105  CO2 26 23  GLUCOSE 99 97  BUN 26* 19  CREATININE 0.69 0.55  CALCIUM 9.0 8.4*  MG  --  1.5*  PHOS  --  3.0   GFR: Estimated Creatinine Clearance: 73.8 mL/min (by C-G formula based on SCr of 0.55 mg/dL). Liver Function Tests: No results for input(s): AST, ALT, ALKPHOS, BILITOT, PROT, ALBUMIN in the last 168 hours. No results for input(s): LIPASE, AMYLASE in the last 168 hours. No results for input(s): AMMONIA in the last 168 hours. Coagulation Profile: No results for input(s): INR, PROTIME in the last 168 hours. Cardiac Enzymes: No results for input(s): CKTOTAL, CKMB, CKMBINDEX, TROPONINI in the last 168 hours. BNP (last 3 results) No results for input(s): PROBNP in the last 8760 hours. HbA1C: No results for input(s): HGBA1C in the last 72 hours. CBG: Recent Labs  Lab 07/18/21 0245 07/18/21 1509 07/18/21 1721  GLUCAP 96 89 111*   Lipid Profile: No results for input(s): CHOL, HDL, LDLCALC, TRIG, CHOLHDL, LDLDIRECT in the last 72 hours. Thyroid Function Tests: No results for input(s): TSH, T4TOTAL, FREET4, T3FREE, THYROIDAB in the last 72 hours. Anemia Panel: No results for input(s): VITAMINB12, FOLATE, FERRITIN, TIBC, IRON, RETICCTPCT in the last 72 hours. Sepsis Labs: No results for input(s): PROCALCITON, LATICACIDVEN in the last 168 hours.  Recent Results (from the past 240 hour(s))  Resp Panel by RT-PCR (Flu A&B, Covid) Nasopharyngeal Swab      Status: None   Collection Time: 07/18/21  2:01 AM   Specimen: Nasopharyngeal Swab; Nasopharyngeal(NP) swabs in vial transport medium  Result Value Ref Range Status   SARS Coronavirus 2 by RT PCR NEGATIVE NEGATIVE Final    Comment: (NOTE) SARS-CoV-2 target nucleic acids are NOT DETECTED.  The SARS-CoV-2 RNA is generally detectable in upper respiratory specimens during the acute phase of infection. The lowest concentration of SARS-CoV-2 viral copies this assay can detect is 138 copies/mL. A negative result does not preclude SARS-Cov-2 infection and should not be used as the sole basis for treatment or other patient management decisions. A negative result may occur with  improper specimen collection/handling, submission of specimen other than nasopharyngeal swab, presence of  viral mutation(s) within the areas targeted by this assay, and inadequate number of viral copies(<138 copies/mL). A negative result must be combined with clinical observations, patient history, and epidemiological information. The expected result is Negative.  Fact Sheet for Patients:  EntrepreneurPulse.com.au  Fact Sheet for Healthcare Providers:  IncredibleEmployment.be  This test is no t yet approved or cleared by the [place] FDA and  has been authorized for detection and/or diagnosis of SARS-CoV-2 by FDA under an Emergency Use Authorization (EUA). This EUA will remain  in effect (meaning this test can be used) for the duration of the COVID-19 declaration under Section 564(b)(1) of the Act, 21 U.S.C.section 360bbb-3(b)(1), unless the authorization is terminated  or revoked sooner.       Influenza A by PCR NEGATIVE NEGATIVE Final   Influenza B by PCR NEGATIVE NEGATIVE Final    Comment: (NOTE) The Xpert Xpress SARS-CoV-2/FLU/RSV plus assay is intended as an aid in the diagnosis of influenza from Nasopharyngeal swab specimens and should not be used as a sole basis  for treatment. Nasal washings and aspirates are unacceptable for Xpert Xpress SARS-CoV-2/FLU/RSV testing.  Fact Sheet for Patients: EntrepreneurPulse.com.au  Fact Sheet for Healthcare Providers: IncredibleEmployment.be  This test is not yet approved or cleared by the [place] FDA and has been authorized for detection and/or diagnosis of SARS-CoV-2 by FDA under an Emergency Use Authorization (EUA). This EUA will remain in effect (meaning this test can be used) for the duration of the COVID-19 declaration under Section 564(b)(1) of the Act, 21 U.S.C. section 360bbb-3(b)(1), unless the authorization is terminated or revoked.  Performed at [facility], [address] [place], [place] [address]          Radiology Studies: No results found.      Scheduled Meds:  dicyclomine  20 mg Oral TID AC   feeding supplement  237 mL Oral TID BM   mometasone-formoterol  2 puff Inhalation BID   multivitamin with minerals  1 tablet Oral Daily   pantoprazole (PROTONIX) IV  40 mg Intravenous Q12H   rivaroxaban  20 mg Oral Q supper   simethicone  80 mg Oral QID   Continuous Infusions:  sodium chloride Stopped (07/18/21 0826)     LOS: 4 days    Time spent: 25 minutes    Sidney Ace, MD [facility]   If 7PM-7AM, please contact night-coverage  07/22/2021, 3:22 PM

## 2021-07-23 ENCOUNTER — Ambulatory Visit: Payer: Medicare HMO | Admitting: Oncology

## 2021-07-23 LAB — CBC WITH DIFFERENTIAL/PLATELET
Abs Immature Granulocytes: 0.04 10*3/uL (ref 0.00–0.07)
Basophils Absolute: 0 10*3/uL (ref 0.0–0.1)
Basophils Relative: 1 %
Eosinophils Absolute: 0.3 10*3/uL (ref 0.0–0.5)
Eosinophils Relative: 5 %
HCT: 28.2 % — ABNORMAL LOW (ref 36.0–46.0)
Hemoglobin: 8.6 g/dL — ABNORMAL LOW (ref 12.0–15.0)
Immature Granulocytes: 1 %
Lymphocytes Relative: 11 %
Lymphs Abs: 0.6 10*3/uL — ABNORMAL LOW (ref 0.7–4.0)
MCH: 26.1 pg (ref 26.0–34.0)
MCHC: 30.5 g/dL (ref 30.0–36.0)
MCV: 85.5 fL (ref 80.0–100.0)
Monocytes Absolute: 0.4 10*3/uL (ref 0.1–1.0)
Monocytes Relative: 7 %
Neutro Abs: 4.1 10*3/uL (ref 1.7–7.7)
Neutrophils Relative %: 75 %
Platelets: 243 10*3/uL (ref 150–400)
RBC: 3.3 MIL/uL — ABNORMAL LOW (ref 3.87–5.11)
RDW: 19.4 % — ABNORMAL HIGH (ref 11.5–15.5)
WBC: 5.4 10*3/uL (ref 4.0–10.5)
nRBC: 0 % (ref 0.0–0.2)

## 2021-07-23 MED ORDER — PANTOPRAZOLE SODIUM 40 MG PO TBEC: 40.0000 mg | DELAYED_RELEASE_TABLET | Freq: Every day | ORAL | 0 refills | Status: DC

## 2021-07-23 NOTE — Plan of Care (Signed)
Problem: Health Behavior/Discharge Planning: Goal: Ability to manage health-related needs will improve Outcome: Completed/Met   Problem: Clinical Measurements: Goal: Will remain free from infection Outcome: Completed/Met   Problem: Activity: Goal: Risk for activity intolerance will decrease Outcome: Completed/Met   Problem: Nutrition: Goal: Adequate nutrition will be maintained Outcome: Completed/Met   Problem: Pain Managment: Goal: General experience of comfort will improve Outcome: Completed/Met   Problem: Skin Integrity: Goal: Risk for impaired skin integrity will decrease Outcome: Completed/Met   Problem: Inadequate Intake (NI-2.1) Goal: Food and/or nutrient delivery Description: Individualized approach for food/nutrient provision. Outcome: Completed/Met   Problem: Acute Rehab OT Goals (only OT should resolve) Goal: Pt. Will Perform Grooming Outcome: Completed/Met Goal: Pt. Will Perform Lower Body Dressing Outcome: Completed/Met Goal: Pt. Will Transfer To Toilet Outcome: Completed/Met   Problem: Acute Rehab PT Goals(only PT should resolve) Goal: Pt Will Go Supine/Side To Sit Outcome: Completed/Met Goal: Pt Will Transfer Bed To Chair/Chair To Bed Outcome: Completed/Met Goal: Pt Will Ambulate Outcome: Completed/Met Goal: Pt Will Go Up/Down Stairs Outcome: Completed/Met

## 2021-07-23 NOTE — Progress Notes (Signed)
Patient discharged to home with home health via self/private transport. Patient and family given discharge instructions with no questions or complications. PIVX2 removed with catheter intact. VSS. Patient and family has no complaints or questions at this time.

## 2021-07-23 NOTE — TOC Transition Note (Signed)
Transition of Care [facility]) - CM/SW Discharge Note   Patient Details  Name: Courtney Mack MRN: [id number] Date of Birth: May 05, 1956  [facility] CM/SW Contact:  Eileen Stanford, LCSW Phone Number: 07/23/2021, 11:34 AM   Clinical Narrative:   [place] services arranged through [facility]. Jason with [facility] notified of dc.      Barriers to Discharge: Continued Medical Work up   Patient Goals and CMS Choice        Discharge Placement                       Discharge Plan and Services                                     Social Determinants of Health (SDOH) Interventions     Readmission Risk Interventions Readmission Risk Prevention Plan 07/19/2021 06/23/2021  Transportation Screening Complete Complete  PCP or Specialist Appt within 3-5 Days - Complete  HRI or Home Care Consult Complete -  Social Work Consult for [place] Planning/Counseling Complete Complete  Palliative Care Screening Not Applicable Not Applicable  Medication Review [employer]) Complete Complete  Some recent data might be hidden

## 2021-07-23 NOTE — Discharge Summary (Signed)
Physician Discharge Summary  Courtney Mack ALP:[id number] DOB: 23-May-1956 DOA: 07/18/2021  PCP[facility] [place] date: 07/18/2021 Discharge date: 07/23/2021  Admitted From: Home Disposition: Home  Recommendations for Outpatient Follow-up:  Follow up with PCP in 1-2 weeks   Home Health: No Equipment/Devices: None  Discharge Condition: Stable CODE STATUS: Full Diet recommendation: Regular  Brief/Interim Summary:  66 year old female who presents for evaluation of acute on chronic anemia associated with GI bleed.  Patient had a recent admission in December 2022 where she underwent upper and lower endoscopy as well as a video capsule study.  All these were negative for bleeding source.  Patient was restarted on her anticoagulation and discharged home.  She presents again with hemoglobin of 4.8.   Nuclear medicine bleeding scan on 1/11 is positive for lower GI bleed at splenic flexure.  Vascular surgery is consulted.  Status post endovascular embolization on 1/11.  Tolerated procedure well.   Post procedurally patient remained stable.  Hemoglobin stable and trending upwards.  Patient still with dark stools.  On day of discharge hemoglobin remained stable.  No clinical signs of blood loss.  Will discharge home at this time.  Close follow-up with outpatient necessary.  Recommend repeat hemoglobin check within 5 to 7 days.    Discharge Diagnoses:  Principal Problem:   Melena Active Problems:   Acute on chronic blood loss anemia GI bleed Acute on chronic blood loss anemia Status post Kcentra in ED Hemoglobin on presentation 4.8 Received 3 units PRBC so far NM bleeding scan positive for GI bleed at splenic flexure Status postembolization with vascular surgery on 1/11 Hemoglobin post procedure has been stable Xarelto restarted 1/13 Hemoglobin 8.5 on 1/14, slight downtrend.   Stable after, still with dark stools, possibly old blood Plan: Hemoglobin  stable at time of discharge.  Will discharge with instructions to continue home Xarelto.  Patient instructed to follow-up with primary care physician within 5 to 7 days of discharge for repeat CBC and further discussion regarding risk/benefit of ongoing anticoagulation   Hx of Pulmonary embolism and DVT:  -Pt is s/p of IVC filter on 08/18/20. -High risk DVT -Xarelto restarted 1/13    Discharge Instructions  Discharge Instructions     Diet - low sodium heart healthy   Complete by: As directed    Increase activity slowly   Complete by: As directed    No wound care   Complete by: As directed       Allergies as of 07/23/2021   No Known Allergies      Medication List     STOP taking these medications    dicyclomine 20 MG tablet Commonly known as: BENTYL   famotidine 20 MG tablet Commonly known as: PEPCID   fluconazole 100 MG tablet Commonly known as: DIFLUCAN       TAKE these medications    Dulera 100-5 MCG/ACT Aero Generic drug: mometasone-formoterol Inhale 2 puffs into the lungs 2 (two) times daily.   Iron 325 (65 Fe) MG Tabs Take 325 mg by mouth every other day.   loperamide 2 MG capsule Commonly known as: IMODIUM Take 1 capsule (2 mg total) by mouth as needed for diarrhea or loose stools.   losartan 25 MG tablet Commonly known as: COZAAR Take 1 tablet (25 mg total) by mouth daily. What changed: when to take this   metFORMIN 500 MG tablet Commonly known as: GLUCOPHAGE Take 1 tablet (500 mg total) by mouth 2 (two) times daily  with a meal.   multivitamin with minerals Tabs tablet Take 1 tablet by mouth daily.   pantoprazole 40 MG tablet Commonly known as: PROTONIX Take 1 tablet (40 mg total) by mouth daily. What changed: when to take this   rivaroxaban 20 MG Tabs tablet Commonly known as: XARELTO Take 1 tablet (20 mg total) by mouth daily with supper.   rosuvastatin 10 MG tablet Commonly known as: CRESTOR Take 1 tablet (10 mg total) by mouth  daily.   simethicone 80 MG chewable tablet Commonly known as: MYLICON Chew 1 tablet (80 mg total) by mouth 4 (four) times daily.        No Known Allergies  Consultations: Vascular surgery   Procedures/Studies: DG Chest 2 View  Result Date: 07/18/2021 CLINICAL DATA:  Shortness of breath. EXAM: CHEST - 2 VIEW COMPARISON:  Chest radiograph dated 06/20/2021 FINDINGS: No focal consolidation, pleural effusion, or pneumothorax. The cardiac silhouette is within normal limits. No acute osseous pathology. Degenerative changes of the spine. An IVC filter is noted. IMPRESSION: No active cardiopulmonary disease. Electronically Signed   By: Anner Crete M.D.   On: 07/18/2021 02:17   NM GI Blood Loss  Result Date: 07/18/2021 CLINICAL DATA:  Lower GI bleeding EXAM: NUCLEAR MEDICINE GASTROINTESTINAL BLEEDING SCAN TECHNIQUE: Sequential abdominal images were obtained following intravenous administration of Tc-76m labeled red blood cells. RADIOPHARMACEUTICALS:  21.3 mCi Tc-89m pertechnetate in-vitro labeled red cells. COMPARISON:  06/10/2021 FINDINGS: Within the first hour of imaging, there is peristalsing activity beginning in the left upper quadrant and progressing into the rectum compatible with active GI bleeding in the splenic flexure of the colon. Second hour of imaging was not performed. IMPRESSION: Positive exam for lower GI bleeding in the splenic flexure of the colon. These results will be called to the ordering clinician or representative by the Radiologist Assistant, and communication documented in the PACS or [employer]. Electronically Signed   By: Jerilynn Mages.  Shick M.D.   On: 07/18/2021 13:36   PERIPHERAL VASCULAR CATHETERIZATION  Result Date: 07/18/2021 See surgical note for result.     Subjective: Seen and examined on the day of discharge.  Stable no distress.  Stable for discharge home.  Discharge Exam: Vitals:   07/23/21 0646 07/23/21 0836  BP: (!) 121/51 (!) 121/49  Pulse: 85  94  Resp: 18 18  Temp: 98 F (36.7 C) 97.9 F (36.6 C)  SpO2: 95% 90%   Vitals:   07/22/21 1508 07/22/21 2258 07/23/21 0646 07/23/21 0836  BP: (!) 123/44 (!) 112/45 (!) 121/51 (!) 121/49  Pulse: 93 97 85 94  Resp: 18 20 18 18   Temp: 98.6 F (37 C) 98.1 F (36.7 C) 98 F (36.7 C) 97.9 F (36.6 C)  TempSrc:  Oral Oral Oral  SpO2: 94% 94% 95% 90%  Weight:      Height:        General: Pt is alert, awake, not in acute distress Cardiovascular: RRR, S1/S2 +, no rubs, no gallops Respiratory: CTA bilaterally, no wheezing, no rhonchi Abdominal: Soft, NT, ND, bowel sounds + Extremities: no edema, no cyanosis    The results of significant diagnostics from this hospitalization (including imaging, microbiology, ancillary and laboratory) are listed below for reference.     Microbiology: Recent Results (from the past 240 hour(s))  Resp Panel by RT-PCR (Flu A&B, Covid) Nasopharyngeal Swab     Status: None   Collection Time: 07/18/21  2:01 AM   Specimen: Nasopharyngeal Swab; Nasopharyngeal(NP) swabs in vial transport medium  Result Value Ref Range Status   SARS Coronavirus 2 by RT PCR NEGATIVE NEGATIVE Final    Comment: (NOTE) SARS-CoV-2 target nucleic acids are NOT DETECTED.  The SARS-CoV-2 RNA is generally detectable in upper respiratory specimens during the acute phase of infection. The lowest concentration of SARS-CoV-2 viral copies this assay can detect is 138 copies/mL. A negative result does not preclude SARS-Cov-2 infection and should not be used as the sole basis for treatment or other patient management decisions. A negative result may occur with  improper specimen collection/handling, submission of specimen other than nasopharyngeal swab, presence of viral mutation(s) within the areas targeted by this assay, and inadequate number of viral copies(<138 copies/mL). A negative result must be combined with clinical observations, patient history, and  epidemiological information. The expected result is Negative.  Fact Sheet for Patients:  EntrepreneurPulse.com.au  Fact Sheet for Healthcare Providers:  IncredibleEmployment.be  This test is no t yet approved or cleared by the [place] FDA and  has been authorized for detection and/or diagnosis of SARS-CoV-2 by FDA under an Emergency Use Authorization (EUA). This EUA will remain  in effect (meaning this test can be used) for the duration of the COVID-19 declaration under Section 564(b)(1) of the Act, 21 U.S.C.section 360bbb-3(b)(1), unless the authorization is terminated  or revoked sooner.       Influenza A by PCR NEGATIVE NEGATIVE Final   Influenza B by PCR NEGATIVE NEGATIVE Final    Comment: (NOTE) The Xpert Xpress SARS-CoV-2/FLU/RSV plus assay is intended as an aid in the diagnosis of influenza from Nasopharyngeal swab specimens and should not be used as a sole basis for treatment. Nasal washings and aspirates are unacceptable for Xpert Xpress SARS-CoV-2/FLU/RSV testing.  Fact Sheet for Patients: EntrepreneurPulse.com.au  Fact Sheet for Healthcare Providers: IncredibleEmployment.be  This test is not yet approved or cleared by the [place] FDA and has been authorized for detection and/or diagnosis of SARS-CoV-2 by FDA under an Emergency Use Authorization (EUA). This EUA will remain in effect (meaning this test can be used) for the duration of the COVID-19 declaration under Section 564(b)(1) of the Act, 21 U.S.C. section 360bbb-3(b)(1), unless the authorization is terminated or revoked.  Performed at [facility], [address] [place], [place] [address]      Labs: BNP (last 3 results) Recent Labs    01/02/21 0534 06/20/21 1049 07/18/21 0201  BNP 87.9 432.3* 80.0   Basic Metabolic Panel: Recent Labs  Lab 07/18/21 0201 07/19/21 0354  NA 136 138  K 3.7 3.2*  CL  104 105  CO2 26 23  GLUCOSE 99 97  BUN 26* 19  CREATININE 0.69 0.55  CALCIUM 9.0 8.4*  MG  --  1.5*  PHOS  --  3.0   Liver Function Tests: No results for input(s): AST, ALT, ALKPHOS, BILITOT, PROT, ALBUMIN in the last 168 hours. No results for input(s): LIPASE, AMYLASE in the last 168 hours. No results for input(s): AMMONIA in the last 168 hours. CBC: Recent Labs  Lab 07/18/21 1120 07/19/21 0354 07/19/21 1251 07/21/21 0550 07/21/21 1259 07/22/21 0521 07/22/21 1309 07/23/21 0527  WBC 5.3 7.6  --  8.6  --  6.5  --  5.4  NEUTROABS  --   --   --  7.1  --  5.3  --  4.1  HGB 5.9* 8.0*   < > 8.5* 8.2* 8.8* 9.5* 8.6*  HCT 19.6* 25.4*  --  27.4*  --  29.4*  --  28.2*  MCV 82.4 81.2  --  83.5  --  87.0  --  85.5  PLT 208 231  --  206  --  230  --  243   < > = values in this interval not displayed.   Cardiac Enzymes: No results for input(s): CKTOTAL, CKMB, CKMBINDEX, TROPONINI in the last 168 hours. BNP: Invalid input(s): POCBNP CBG: Recent Labs  Lab 07/18/21 0245 07/18/21 1509 07/18/21 1721  GLUCAP 96 89 111*   D-Dimer No results for input(s): DDIMER in the last 72 hours. Hgb A1c No results for input(s): HGBA1C in the last 72 hours. Lipid Profile No results for input(s): CHOL, HDL, LDLCALC, TRIG, CHOLHDL, LDLDIRECT in the last 72 hours. Thyroid function studies No results for input(s): TSH, T4TOTAL, T3FREE, THYROIDAB in the last 72 hours.  Invalid input(s): FREET3 Anemia work up No results for input(s): VITAMINB12, FOLATE, FERRITIN, TIBC, IRON, RETICCTPCT in the last 72 hours. Urinalysis    Component Value Date/Time   COLORURINE YELLOW (A) 07/18/2021 1120   APPEARANCEUR HAZY (A) 07/18/2021 1120   LABSPEC 1.033 (H) 07/18/2021 1120   PHURINE 5.0 07/18/2021 1120   GLUCOSEU NEGATIVE 07/18/2021 1120   HGBUR NEGATIVE 07/18/2021 1120   BILIRUBINUR NEGATIVE 07/18/2021 1120   KETONESUR 20 (A) 07/18/2021 1120   PROTEINUR NEGATIVE 07/18/2021 1120   NITRITE NEGATIVE  07/18/2021 1120   LEUKOCYTESUR NEGATIVE 07/18/2021 1120   Sepsis Labs Invalid input(s): PROCALCITONIN,  WBC,  LACTICIDVEN Microbiology Recent Results (from the past 240 hour(s))  Resp Panel by RT-PCR (Flu A&B, Covid) Nasopharyngeal Swab     Status: None   Collection Time: 07/18/21  2:01 AM   Specimen: Nasopharyngeal Swab; Nasopharyngeal(NP) swabs in vial transport medium  Result Value Ref Range Status   SARS Coronavirus 2 by RT PCR NEGATIVE NEGATIVE Final    Comment: (NOTE) SARS-CoV-2 target nucleic acids are NOT DETECTED.  The SARS-CoV-2 RNA is generally detectable in upper respiratory specimens during the acute phase of infection. The lowest concentration of SARS-CoV-2 viral copies this assay can detect is 138 copies/mL. A negative result does not preclude SARS-Cov-2 infection and should not be used as the sole basis for treatment or other patient management decisions. A negative result may occur with  improper specimen collection/handling, submission of specimen other than nasopharyngeal swab, presence of viral mutation(s) within the areas targeted by this assay, and inadequate number of viral copies(<138 copies/mL). A negative result must be combined with clinical observations, patient history, and epidemiological information. The expected result is Negative.  Fact Sheet for Patients:  EntrepreneurPulse.com.au  Fact Sheet for Healthcare Providers:  IncredibleEmployment.be  This test is no t yet approved or cleared by the [place] FDA and  has been authorized for detection and/or diagnosis of SARS-CoV-2 by FDA under an Emergency Use Authorization (EUA). This EUA will remain  in effect (meaning this test can be used) for the duration of the COVID-19 declaration under Section 564(b)(1) of the Act, 21 U.S.C.section 360bbb-3(b)(1), unless the authorization is terminated  or revoked sooner.       Influenza A by PCR NEGATIVE NEGATIVE  Final   Influenza B by PCR NEGATIVE NEGATIVE Final    Comment: (NOTE) The Xpert Xpress SARS-CoV-2/FLU/RSV plus assay is intended as an aid in the diagnosis of influenza from Nasopharyngeal swab specimens and should not be used as a sole basis for treatment. Nasal washings and aspirates are unacceptable for Xpert Xpress SARS-CoV-2/FLU/RSV testing.  Fact Sheet for Patients: EntrepreneurPulse.com.au  Fact Sheet for Healthcare Providers: IncredibleEmployment.be  This test is not yet approved or cleared by the [place] and has been authorized for detection and/or diagnosis of SARS-CoV-2 by FDA under an Emergency Use Authorization (EUA). This EUA will remain in effect (meaning this test can be used) for the duration of the COVID-19 declaration under Section 564(b)(1) of the Act, 21 U.S.C. section 360bbb-3(b)(1), unless the authorization is terminated or revoked.  Performed at [facility], [address] [place], [place] [address]      Time coordinating discharge: Over 30 minutes  SIGNED:   Sidney Ace, MD  [facility] 07/23/2021, 3:13 PM Pager   If 7PM-7AM, please contact night-coverage

## 2021-07-30 ENCOUNTER — Emergency Department
Admission: EM | Admit: 2021-07-30 | Discharge: 2021-07-30 | Discharge status: Against Medical Advice | Payer: Medicare (Managed Care) | Source: Home / Self Care

## 2021-07-31 ENCOUNTER — Other Ambulatory Visit: Payer: Self-pay | Admitting: *Deleted

## 2021-07-31 DIAGNOSIS — D500 Iron deficiency anemia secondary to blood loss (chronic): Secondary | ICD-10-CM

## 2021-08-06 ENCOUNTER — Inpatient Hospital Stay: Payer: Medicare (Managed Care) | Attending: Oncology

## 2021-08-06 ENCOUNTER — Ambulatory Visit: Payer: Medicare (Managed Care) | Attending: Radiation Oncology | Admitting: Radiation Oncology

## 2021-08-07 ENCOUNTER — Other Ambulatory Visit: Payer: Medicare (Managed Care)

## 2021-08-09 ENCOUNTER — Inpatient Hospital Stay: Payer: Medicare (Managed Care) | Attending: Oncology | Admitting: Oncology

## 2021-08-14 ENCOUNTER — Emergency Department: Payer: Medicare (Managed Care)

## 2021-08-14 ENCOUNTER — Inpatient Hospital Stay
Admission: EM | Admit: 2021-08-14 | Discharge: 2021-08-18 | DRG: 377 | Disposition: A | Discharge status: Home Health | Payer: Medicare (Managed Care) | Attending: Internal Medicine | Admitting: Internal Medicine

## 2021-08-14 DIAGNOSIS — D62 Acute posthemorrhagic anemia: Secondary | ICD-10-CM | POA: Diagnosis present

## 2021-08-14 DIAGNOSIS — Z79899 Other long term (current) drug therapy: Secondary | ICD-10-CM | POA: Diagnosis not present

## 2021-08-14 DIAGNOSIS — Z7901 Long term (current) use of anticoagulants: Secondary | ICD-10-CM | POA: Diagnosis not present

## 2021-08-14 DIAGNOSIS — E7800 Pure hypercholesterolemia, unspecified: Secondary | ICD-10-CM | POA: Diagnosis present

## 2021-08-14 DIAGNOSIS — J9601 Acute respiratory failure with hypoxia: Secondary | ICD-10-CM | POA: Diagnosis present

## 2021-08-14 DIAGNOSIS — Z87891 Personal history of nicotine dependence: Secondary | ICD-10-CM

## 2021-08-14 DIAGNOSIS — Z6841 Body Mass Index (BMI) 40.0 and over, adult: Secondary | ICD-10-CM | POA: Diagnosis not present

## 2021-08-14 DIAGNOSIS — I2699 Other pulmonary embolism without acute cor pulmonale: Secondary | ICD-10-CM | POA: Diagnosis present

## 2021-08-14 DIAGNOSIS — K31819 Angiodysplasia of stomach and duodenum without bleeding: Secondary | ICD-10-CM | POA: Diagnosis not present

## 2021-08-14 DIAGNOSIS — I2782 Chronic pulmonary embolism: Secondary | ICD-10-CM | POA: Diagnosis not present

## 2021-08-14 DIAGNOSIS — U071 COVID-19: Secondary | ICD-10-CM | POA: Diagnosis present

## 2021-08-14 DIAGNOSIS — Z8542 Personal history of malignant neoplasm of other parts of uterus: Secondary | ICD-10-CM | POA: Diagnosis not present

## 2021-08-14 DIAGNOSIS — Z833 Family history of diabetes mellitus: Secondary | ICD-10-CM

## 2021-08-14 DIAGNOSIS — B9689 Other specified bacterial agents as the cause of diseases classified elsewhere: Secondary | ICD-10-CM | POA: Diagnosis present

## 2021-08-14 DIAGNOSIS — K921 Melena: Secondary | ICD-10-CM | POA: Diagnosis not present

## 2021-08-14 DIAGNOSIS — Z803 Family history of malignant neoplasm of breast: Secondary | ICD-10-CM | POA: Diagnosis not present

## 2021-08-14 DIAGNOSIS — K31811 Angiodysplasia of stomach and duodenum with bleeding: Secondary | ICD-10-CM | POA: Diagnosis present

## 2021-08-14 DIAGNOSIS — Z83438 Family history of other disorder of lipoprotein metabolism and other lipidemia: Secondary | ICD-10-CM | POA: Diagnosis not present

## 2021-08-14 DIAGNOSIS — Z86718 Personal history of other venous thrombosis and embolism: Secondary | ICD-10-CM | POA: Diagnosis not present

## 2021-08-14 DIAGNOSIS — E119 Type 2 diabetes mellitus without complications: Secondary | ICD-10-CM | POA: Diagnosis not present

## 2021-08-14 DIAGNOSIS — Z95828 Presence of other vascular implants and grafts: Secondary | ICD-10-CM | POA: Diagnosis not present

## 2021-08-14 DIAGNOSIS — E11649 Type 2 diabetes mellitus with hypoglycemia without coma: Secondary | ICD-10-CM | POA: Diagnosis present

## 2021-08-14 DIAGNOSIS — Z8249 Family history of ischemic heart disease and other diseases of the circulatory system: Secondary | ICD-10-CM | POA: Diagnosis not present

## 2021-08-14 DIAGNOSIS — I110 Hypertensive heart disease with heart failure: Secondary | ICD-10-CM | POA: Diagnosis present

## 2021-08-14 DIAGNOSIS — D649 Anemia, unspecified: Secondary | ICD-10-CM | POA: Diagnosis present

## 2021-08-14 DIAGNOSIS — I5032 Chronic diastolic (congestive) heart failure: Secondary | ICD-10-CM | POA: Diagnosis present

## 2021-08-14 DIAGNOSIS — I959 Hypotension, unspecified: Secondary | ICD-10-CM | POA: Diagnosis present

## 2021-08-14 DIAGNOSIS — I10 Essential (primary) hypertension: Secondary | ICD-10-CM | POA: Diagnosis present

## 2021-08-14 DIAGNOSIS — K922 Gastrointestinal hemorrhage, unspecified: Secondary | ICD-10-CM

## 2021-08-14 DIAGNOSIS — K2940 Chronic atrophic gastritis without bleeding: Secondary | ICD-10-CM | POA: Diagnosis not present

## 2021-08-14 LAB — URINALYSIS, ROUTINE W REFLEX MICROSCOPIC
Bilirubin Urine: NEGATIVE
Glucose, UA: NEGATIVE mg/dL
Hgb urine dipstick: NEGATIVE
Ketones, ur: NEGATIVE mg/dL
Nitrite: NEGATIVE
Protein, ur: NEGATIVE mg/dL
Specific Gravity, Urine: 1.02 (ref 1.005–1.030)
pH: 8 (ref 5.0–8.0)

## 2021-08-14 LAB — COMPREHENSIVE METABOLIC PANEL
ALT: 9 U/L (ref 0–44)
AST: 19 U/L (ref 15–41)
Albumin: 2.9 g/dL — ABNORMAL LOW (ref 3.5–5.0)
Alkaline Phosphatase: 38 U/L (ref 38–126)
Anion gap: 6 (ref 5–15)
BUN: 24 mg/dL — ABNORMAL HIGH (ref 8–23)
CO2: 25 mmol/L (ref 22–32)
Calcium: 8.9 mg/dL (ref 8.9–10.3)
Chloride: 106 mmol/L (ref 98–111)
Creatinine, Ser: 0.71 mg/dL (ref 0.44–1.00)
GFR, Estimated: >60 mL/min (ref >60)
Glucose, Bld: 131 mg/dL — ABNORMAL HIGH (ref 70–99)
Potassium: 4.1 mmol/L (ref 3.5–5.1)
Sodium: 137 mmol/L (ref 135–145)
Total Bilirubin: 0.6 mg/dL (ref 0.3–1.2)
Total Protein: 6.2 g/dL — ABNORMAL LOW (ref 6.5–8.1)

## 2021-08-14 LAB — RESP PANEL BY RT-PCR (FLU A&B, COVID) ARPGX2
Influenza A by PCR: NEGATIVE
Influenza B by PCR: NEGATIVE
SARS Coronavirus 2 by RT PCR: POSITIVE — AB

## 2021-08-14 LAB — CBC WITH DIFFERENTIAL/PLATELET
Abs Immature Granulocytes: 0.02 10*3/uL (ref 0.00–0.07)
Basophils Absolute: 0 10*3/uL (ref 0.0–0.1)
Basophils Relative: 1 %
Eosinophils Absolute: 0.1 10*3/uL (ref 0.0–0.5)
Eosinophils Relative: 3 %
HCT: 16.4 % — ABNORMAL LOW (ref 36.0–46.0)
Hemoglobin: 4.6 g/dL — CL (ref 12.0–15.0)
Immature Granulocytes: 0 %
Lymphocytes Relative: 16 %
Lymphs Abs: 0.8 10*3/uL (ref 0.7–4.0)
MCH: 25.6 pg — ABNORMAL LOW (ref 26.0–34.0)
MCHC: 28 g/dL — ABNORMAL LOW (ref 30.0–36.0)
MCV: 91.1 fL (ref 80.0–100.0)
Monocytes Absolute: 0.4 10*3/uL (ref 0.1–1.0)
Monocytes Relative: 8 %
Neutro Abs: 3.4 10*3/uL (ref 1.7–7.7)
Neutrophils Relative %: 72 %
Platelets: 288 10*3/uL (ref 150–400)
RBC: 1.8 MIL/uL — ABNORMAL LOW (ref 3.87–5.11)
RDW: 19.3 % — ABNORMAL HIGH (ref 11.5–15.5)
WBC: 4.8 10*3/uL (ref 4.0–10.5)
nRBC: 0 % (ref 0.0–0.2)

## 2021-08-14 LAB — LACTIC ACID, PLASMA
Lactic Acid, Venous: 1.6 mmol/L (ref 0.5–1.9)
Lactic Acid, Venous: 1.1 mmol/L (ref 0.5–1.9)

## 2021-08-14 LAB — TROPONIN I (HIGH SENSITIVITY)
Troponin I (High Sensitivity): 6 ng/L (ref <18)
Troponin I (High Sensitivity): 6 ng/L (ref <18)

## 2021-08-14 LAB — PROTIME-INR
INR: 1.1 (ref 0.8–1.2)
Prothrombin Time: 14.6 seconds (ref 11.4–15.2)

## 2021-08-14 LAB — APTT: aPTT: 33 seconds (ref 24–36)

## 2021-08-14 MED ORDER — SODIUM CHLORIDE 0.9 % IV SOLN
10.0000 mL/h | Freq: Once | INTRAVENOUS | Status: AC
  Administered 2021-08-15: 10 mL/h via INTRAVENOUS

## 2021-08-14 MED ORDER — DOCUSATE SODIUM 100 MG PO CAPS: 100.0000 mg | ORAL_CAPSULE | Freq: Two times a day (BID) | ORAL | Status: DC | PRN

## 2021-08-14 MED ORDER — HYDROCODONE-ACETAMINOPHEN 5-325 MG PO TABS
1.0000 | ORAL_TABLET | Freq: Once | ORAL | Status: AC
  Administered 2021-08-14: 1 via ORAL
  Filled 2021-08-14: qty 1

## 2021-08-14 MED ORDER — SODIUM CHLORIDE 0.9 % IV BOLUS
1000.0000 mL | Freq: Once | INTRAVENOUS | Status: AC
  Administered 2021-08-14: 1000 mL via INTRAVENOUS

## 2021-08-14 MED ORDER — PANTOPRAZOLE SODIUM 40 MG IV SOLR
40.0000 mg | Freq: Two times a day (BID) | INTRAVENOUS | Status: DC
  Administered 2021-08-18: 11:00:00 40 mg via INTRAVENOUS
  Filled 2021-08-14: qty 10

## 2021-08-14 MED ORDER — IOHEXOL 350 MG/ML SOLN
100.0000 mL | Freq: Once | INTRAVENOUS | Status: AC | PRN
  Administered 2021-08-14: 100 mL via INTRAVENOUS

## 2021-08-14 MED ORDER — PANTOPRAZOLE 80MG IVPB - SIMPLE MED
80.0000 mg | Freq: Once | INTRAVENOUS | Status: AC
  Administered 2021-08-14: 80 mg via INTRAVENOUS
  Filled 2021-08-14: qty 100

## 2021-08-14 MED ORDER — POLYETHYLENE GLYCOL 3350 17 G PO PACK: 17.0000 g | PACK | Freq: Every day | ORAL | Status: DC | PRN

## 2021-08-14 MED ORDER — PANTOPRAZOLE INFUSION (NEW) - SIMPLE MED
8.0000 mg/h | INTRAVENOUS | Status: AC
  Administered 2021-08-14 – 2021-08-17 (×7): 8 mg/h via INTRAVENOUS
  Filled 2021-08-14 (×7): qty 100

## 2021-08-14 NOTE — ED Notes (Signed)
Critical HGB of 4.6--MD notified and aware.

## 2021-08-14 NOTE — ED Notes (Signed)
Patient to CT.

## 2021-08-14 NOTE — ED Provider Notes (Signed)
[facility] Provider Note  Patient Contact: 7:11 PM (approximate)   History   Weakness (Hypoglycemic 23 on arrival , now 250 per ems , general weakness )   HPI  Courtney Mack is a 66 y.o. female who presents the emergency department complaining of weakness, dizziness, chest pain, shortness of breath, near syncope, hypoglycemia.  Patient awoke this morning with symptoms.  Symptoms were worsening through the day and she called EMS.  EMS arrived to find the patient's blood sugar at 23 on arrival.  Patient was given D10 and arrived with a blood sugar of 250 on arrival per EMS.  Patient awoke with weakness, chest pain, shortness of breath.  Positive for headache but denies visual changes.  No unilateral weakness.  No difficulty forming thoughts or words.  Patient with no URI symptoms.  Normal bowel movements with no resumption of GI bleeding according to the patient.  No vomiting and no urinary symptoms.    Patient was admitted in December and subsequently in January for symptomatic anemia.  According to the medical record, her admission in January had a hemoglobin of 3.6 which appears to be lower than her baseline at 10.7.  She received 4 units of packed red blood cells, and had EGD, colonoscopy and video capsule endoscopy.  These were negative for bleeding at the time.  She returned in January with a hemoglobin of 4.8.  She received 3 units of packed red blood cells at that time and had a positive nuclear medicine GI bleeding scan at the splenic flexure with endovascular embolization performed.  Patient had hemoglobin of 8.6 at time of discharge.  She has had ongoing dark stools since this occurrence but was evaluated by GI and it was determined this was likely due to iron intake.     Physical Exam   Triage Vital Signs: ED Triage Vitals  Enc Vitals Group     BP --      Pulse Rate 08/14/21 1841 (!) 105     Resp 08/14/21 1841 17     Temp 08/14/21 1841 99.5 F (37.5  C)     Temp Source 08/14/21 1841 Axillary     SpO2 08/14/21 1841 99 %     Weight 08/14/21 1842 229 lb 4.5 oz (104 kg)     Height 08/14/21 1842 4\' 11"  (1.499 m)     Head Circumference --      Peak Flow --      Pain Score 08/14/21 1842 8     Pain Loc --      Pain Edu? --      Excl. in [place]? --     Most recent vital signs: Vitals:   08/14/21 2130 08/14/21 2200  BP: (!) 121/46 (!) 106/55  Pulse: 96 90  Resp: (!) 21 (!) 22  Temp:    SpO2: 100% 100%     General: Alert and in no acute distress. Eyes:  PERRL. EOMI.  Neck: No stridor. No cervical spine tenderness to palpation.  Cardiovascular:  Good peripheral perfusion Respiratory: Normal respiratory effort without tachypnea or retractions. Lungs CTAB. Good air entry to the bases with no decreased or absent breath sounds. Gastrointestinal: No visible external abdominal findings.  Bowel sounds 4 quadrants. Soft and nontender to palpation in all quadrants. No guarding or rigidity. No palpable masses. No distention. No CVA tenderness. Musculoskeletal: Full range of motion to all extremities.  Neurologic:  No gross focal neurologic deficits are appreciated.  Skin:  No rash noted Other:   ED Results / Procedures / Treatments   Labs (all labs ordered are listed, but only abnormal results are displayed) Labs Reviewed  RESP PANEL BY RT-PCR (FLU A&B, COVID) ARPGX2 - Abnormal; Notable for the following components:      Result Value   SARS Coronavirus 2 by RT PCR POSITIVE (*)    All other components within normal limits  COMPREHENSIVE METABOLIC PANEL - Abnormal; Notable for the following components:   Glucose, Bld 131 (*)    BUN 24 (*)    Total Protein 6.2 (*)    Albumin 2.9 (*)    All other components within normal limits  CBC WITH DIFFERENTIAL/PLATELET - Abnormal; Notable for the following components:   RBC 1.80 (*)    Hemoglobin 4.6 (*)    HCT 16.4 (*)    MCH 25.6 (*)    MCHC 28.0 (*)    RDW 19.3 (*)    All other components  within normal limits  URINALYSIS, ROUTINE W REFLEX MICROSCOPIC - Abnormal; Notable for the following components:   Color, Urine YELLOW (*)    APPearance CLOUDY (*)    Leukocytes,Ua SMALL (*)    Bacteria, UA MANY (*)    All other components within normal limits  CULTURE, BLOOD (ROUTINE X 2)  CULTURE, BLOOD (ROUTINE X 2)  LACTIC ACID, PLASMA  LACTIC ACID, PLASMA  PROTIME-INR  APTT  CBG MONITORING, ED  PREPARE RBC (CROSSMATCH)  TYPE AND SCREEN  TROPONIN I (HIGH SENSITIVITY)  TROPONIN I (HIGH SENSITIVITY)     EKG  ED ECG REPORT I, Charline Bills Betzaida Cremeens,  personally viewed and interpreted this ECG.   Date: 08/14/2021  EKG Time: 1848 hrs.  Rate: 98 bpm  Rhythm: unchanged from previous tracings, normal sinus rhythm, no significant change from previous EKG from 07/24/2021  Axis: Normal axis  Intervals:none  ST&T Change: No ST elevation or depression noted  Normal sinus rhythm.  No STEMI.  No significant changes from previous EKG from 07/24/2021    RADIOLOGY  I personally viewed and evaluated these images as part of my medical decision making, as well as reviewing the written report by the radiologist.  ED Provider Interpretation: No acute cardiopulmonary findings on chest x-ray.  CT scan of the head reveals no acute intracranial abnormality.  DG Chest 2 View  Result Date: 08/14/2021 CLINICAL DATA:  Weakness, shortness of breath EXAM: CHEST - 2 VIEW COMPARISON:  07/18/2021 FINDINGS: The heart size and mediastinal contours are within normal limits. Both lungs are clear. The visualized skeletal structures are unremarkable. IMPRESSION: No active cardiopulmonary disease. Electronically Signed   By: Rolm Baptise M.D.   On: 08/14/2021 20:20   CT HEAD WO CONTRAST (5MM)  Result Date: 08/14/2021 CLINICAL DATA:  Headache, new or worsening (Age >= 50y). Hypoglycemia. EXAM: CT HEAD WITHOUT CONTRAST TECHNIQUE: Contiguous axial images were obtained from the base of the skull through the  vertex without intravenous contrast. RADIATION DOSE REDUCTION: This exam was performed according to the departmental dose-optimization program which includes automated exposure control, adjustment of the mA and/or kV according to patient size and/or use of iterative reconstruction technique. COMPARISON:  06/20/2021 FINDINGS: Brain: No acute intracranial abnormality. Specifically, no hemorrhage, hydrocephalus, mass lesion, acute infarction, or significant intracranial injury. Vascular: No hyperdense vessel or unexpected calcification. Skull: No acute calvarial abnormality. Sinuses/Orbits: No acute findings. Mucosal thickening throughout the ethmoid air cells. Other: None IMPRESSION: No acute intracranial abnormality. Electronically Signed   By: Rolm Baptise M.D.  On: 08/14/2021 20:09    PROCEDURES:  Critical Care performed: No  Procedures   MEDICATIONS ORDERED IN ED: Medications  0.9 %  sodium chloride infusion (has no administration in time range)  pantoprazole (PROTONIX) 80 mg /NS 100 mL IVPB (has no administration in time range)  pantoprozole (PROTONIX) 80 mg /NS 100 mL infusion (has no administration in time range)  pantoprazole (PROTONIX) injection 40 mg (has no administration in time range)  sodium chloride 0.9 % bolus 1,000 mL (0 mLs Intravenous Stopped 08/14/21 2201)  HYDROcodone-acetaminophen (NORCO/VICODIN) 5-325 MG per tablet 1 tablet (1 tablet Oral Given 08/14/21 2047)  iohexol (OMNIPAQUE) 350 MG/ML injection 100 mL (100 mLs Intravenous Contrast Given 08/14/21 2245)     IMPRESSION / MDM / ASSESSMENT AND PLAN / ED COURSE  I reviewed the triage vital signs and the nursing notes.                              Differential diagnosis includes, but is not limited to, CVA, hypoglycemia, viral illness, pneumonia, anemia, GI bleed   Patient's diagnosis is consistent with symptomatic anemia, hypoglycemia, weakness, COVID-19.  Patient presented to the emergency department after feeling weak,  short of breath and having chest pain earlier today.  Patient called EMS after symptoms were worsening and they arrived to find patient's blood sugar at 23.  Patient was given D10 and arrived with a blood glucose of 250.  Patient was complaining of generalized weakness, chest pain, shortness of breath.  Patient CT scan of her head is reassuring with no intracranial abnormality.  Chest x-ray revealed no acute cardiopulmonary findings.  EKG was reassuring.  Reviewing patient's medical records patient has been admitted multiple times for generalized weakness secondary to blood loss anemia secondary to GI bleeding.  She was seen in December, had video endoscopy, colonoscopy and EGD without source of bleeding.  She been given packed red blood cells but returned roughly a month later symptomatically anemic again.  At that time nuclear medicine study revealed an area of bleeding and this was embolized with vascular surgery.  After receiving packed red blood cells at that time patient stabilizing she was discharged.  Patient returns with similar symptoms today and was found to be anemic at 4.8.  She will have blood at this time.  Patient is also incidentally COVID-positive.  Reportedly the GI bleeding has not increased but it is not improved.  Guaiac positive.  At this time patient will be admitted for blood loss anemia, weakness, COVID. Patient is given ED precautions to return to the ED for any worsening or new symptoms.  Discussed the patient initially with the hospitalist service who requested that we talk to ICU given her hypercoagulable state secondary to COVID with already known DVT and PE.  Patient is on Eliquis for same.  Patient is symptomatically anemic and blood has been ordered at this time.  CT scan is ordered to ensure no other source of occult bleeding.  Patient is on Protonix bolus and then infusion.  I reached out to the ICU team which agrees to accept the patient at this time.        FINAL CLINICAL  IMPRESSION(S) / ED DIAGNOSES   Final diagnoses:  Symptomatic anemia  COVID-19  Gastrointestinal hemorrhage, unspecified gastrointestinal hemorrhage type     Rx / DC Orders   ED Discharge Orders     None        Note:  This document was prepared using Dragon voice recognition software and may include unintentional dictation errors.   Darletta Moll, PA-C 08/14/21 2257    Naaman Plummer, MD 08/15/21 [phone]

## 2021-08-14 NOTE — ED Triage Notes (Signed)
Hypoglycemic 23 on arrival , now 250 per ems , general weakness , chest pain ??? ,

## 2021-08-14 NOTE — ED Triage Notes (Signed)
Sob, cp per patient at thjis time

## 2021-08-14 NOTE — ED Notes (Signed)
Report received from John, RN

## 2021-08-15 ENCOUNTER — Other Ambulatory Visit: Payer: Self-pay

## 2021-08-15 ENCOUNTER — Encounter: Payer: Self-pay | Admitting: Pulmonary Disease

## 2021-08-15 ENCOUNTER — Inpatient Hospital Stay: Payer: Medicare (Managed Care)

## 2021-08-15 DIAGNOSIS — U071 COVID-19: Secondary | ICD-10-CM

## 2021-08-15 DIAGNOSIS — D649 Anemia, unspecified: Secondary | ICD-10-CM

## 2021-08-15 DIAGNOSIS — D62 Acute posthemorrhagic anemia: Secondary | ICD-10-CM

## 2021-08-15 LAB — BLOOD CULTURE ID PANEL (REFLEXED) - BCID2

## 2021-08-15 LAB — CBC
HCT: 17.9 % — ABNORMAL LOW (ref 36.0–46.0)
Hemoglobin: 5.2 g/dL — ABNORMAL LOW (ref 12.0–15.0)
MCH: 26.1 pg (ref 26.0–34.0)
MCHC: 29.1 g/dL — ABNORMAL LOW (ref 30.0–36.0)
MCV: 89.9 fL (ref 80.0–100.0)
Platelets: 249 10*3/uL (ref 150–400)
RBC: 1.99 MIL/uL — ABNORMAL LOW (ref 3.87–5.11)
RDW: 17.2 % — ABNORMAL HIGH (ref 11.5–15.5)
WBC: 4 10*3/uL (ref 4.0–10.5)
nRBC: 0 % (ref 0.0–0.2)

## 2021-08-15 LAB — HEMOGLOBIN A1C
Hgb A1c MFr Bld: 6 % — ABNORMAL HIGH (ref 4.8–5.6)
Mean Plasma Glucose: 125.5 mg/dL

## 2021-08-15 LAB — GLUCOSE, CAPILLARY
Glucose-Capillary: 80 mg/dL (ref 70–99)
Glucose-Capillary: 90 mg/dL (ref 70–99)

## 2021-08-15 LAB — HEMOGLOBIN AND HEMATOCRIT, BLOOD
HCT: 21.5 % — ABNORMAL LOW (ref 36.0–46.0)
HCT: 24.8 % — ABNORMAL LOW (ref 36.0–46.0)
Hemoglobin: 6.7 g/dL — ABNORMAL LOW (ref 12.0–15.0)
Hemoglobin: 7.8 g/dL — ABNORMAL LOW (ref 12.0–15.0)

## 2021-08-15 LAB — MAGNESIUM: Magnesium: 1.6 mg/dL — ABNORMAL LOW (ref 1.7–2.4)

## 2021-08-15 LAB — MRSA NEXT GEN BY PCR, NASAL: MRSA by PCR Next Gen: DETECTED — AB

## 2021-08-15 LAB — PHOSPHORUS: Phosphorus: 3.6 mg/dL (ref 2.5–4.6)

## 2021-08-15 LAB — CBG MONITORING, ED
Glucose-Capillary: 105 mg/dL — ABNORMAL HIGH (ref 70–99)
Glucose-Capillary: 84 mg/dL (ref 70–99)
Glucose-Capillary: 90 mg/dL (ref 70–99)

## 2021-08-15 LAB — PREPARE RBC (CROSSMATCH)

## 2021-08-15 MED ORDER — SODIUM CHLORIDE 0.9 % IV SOLN
100.0000 mg | Freq: Every day | INTRAVENOUS | Status: AC
  Administered 2021-08-16 – 2021-08-17 (×2): 100 mg via INTRAVENOUS
  Filled 2021-08-15: qty 100
  Filled 2021-08-15: qty 20
  Filled 2021-08-15: qty 100

## 2021-08-15 MED ORDER — CHLORHEXIDINE GLUCONATE CLOTH 2 % EX PADS
6.0000 | MEDICATED_PAD | Freq: Every day | CUTANEOUS | Status: DC
  Administered 2021-08-16 – 2021-08-18 (×3): 6 via TOPICAL

## 2021-08-15 MED ORDER — ACETAMINOPHEN 325 MG PO TABS
650.0000 mg | ORAL_TABLET | Freq: Four times a day (QID) | ORAL | Status: DC | PRN
  Administered 2021-08-15 – 2021-08-16 (×2): 650 mg via ORAL
  Filled 2021-08-15 (×2): qty 2

## 2021-08-15 MED ORDER — MUPIROCIN 2 % EX OINT
1.0000 "application " | TOPICAL_OINTMENT | Freq: Two times a day (BID) | CUTANEOUS | Status: DC
  Administered 2021-08-15 – 2021-08-18 (×6): 1 via NASAL
  Filled 2021-08-15: qty 22

## 2021-08-15 MED ORDER — ALBUTEROL SULFATE HFA 108 (90 BASE) MCG/ACT IN AERS
2.0000 | INHALATION_SPRAY | Freq: Four times a day (QID) | RESPIRATORY_TRACT | Status: DC
  Administered 2021-08-15 – 2021-08-16 (×6): 2 via RESPIRATORY_TRACT
  Filled 2021-08-15: qty 6.7

## 2021-08-15 MED ORDER — TECHNETIUM TC 99M-LABELED RED BLOOD CELLS IV KIT
20.0000 | PACK | Freq: Once | INTRAVENOUS | Status: AC | PRN
  Administered 2021-08-15: 20 via INTRAVENOUS

## 2021-08-15 MED ORDER — CHLORHEXIDINE GLUCONATE CLOTH 2 % EX PADS
6.0000 | MEDICATED_PAD | Freq: Every day | CUTANEOUS | Status: DC
  Administered 2021-08-15: 6 via TOPICAL

## 2021-08-15 MED ORDER — SODIUM CHLORIDE 0.9 % IV SOLN
300.0000 mg | Freq: Once | INTRAVENOUS | Status: AC
  Administered 2021-08-15: 300 mg via INTRAVENOUS
  Filled 2021-08-15: qty 300

## 2021-08-15 MED ORDER — MAGNESIUM SULFATE 2 GM/50ML IV SOLN
2.0000 g | Freq: Once | INTRAVENOUS | Status: AC
Start: 2021-08-15 — End: 2021-08-15
  Administered 2021-08-15: 2 g via INTRAVENOUS
  Filled 2021-08-15: qty 50

## 2021-08-15 MED ORDER — HYDROCOD POLI-CHLORPHE POLI ER 10-8 MG/5ML PO SUER
5.0000 mL | Freq: Two times a day (BID) | ORAL | Status: DC | PRN
  Administered 2021-08-15 – 2021-08-17 (×2): 5 mL via ORAL
  Filled 2021-08-15 (×2): qty 5

## 2021-08-15 MED ORDER — ASCORBIC ACID 500 MG PO TABS
500.0000 mg | ORAL_TABLET | Freq: Every day | ORAL | Status: DC
  Administered 2021-08-15 – 2021-08-18 (×3): 500 mg via ORAL
  Filled 2021-08-15 (×3): qty 1

## 2021-08-15 MED ORDER — ZINC SULFATE 220 (50 ZN) MG PO CAPS
220.0000 mg | ORAL_CAPSULE | Freq: Every day | ORAL | Status: DC
  Administered 2021-08-15 – 2021-08-16 (×2): 220 mg via ORAL
  Filled 2021-08-15 (×2): qty 1

## 2021-08-15 MED ORDER — SODIUM CHLORIDE 0.9% IV SOLUTION
Freq: Once | INTRAVENOUS | Status: AC
  Filled 2021-08-15: qty 250

## 2021-08-15 MED ORDER — VITAMIN B-12 1000 MCG PO TABS
1000.0000 ug | ORAL_TABLET | Freq: Every day | ORAL | Status: DC
  Administered 2021-08-15 – 2021-08-18 (×3): 1000 ug via ORAL
  Filled 2021-08-15 (×3): qty 1

## 2021-08-15 MED ORDER — SODIUM CHLORIDE 0.9 % IV SOLN
200.0000 mg | Freq: Once | INTRAVENOUS | Status: AC
  Administered 2021-08-15: 200 mg via INTRAVENOUS
  Filled 2021-08-15: qty 200

## 2021-08-15 MED ORDER — INSULIN ASPART 100 UNIT/ML IJ SOLN: 0.0000 [IU] | INTRAMUSCULAR | Status: DC

## 2021-08-15 MED ORDER — GUAIFENESIN ER 600 MG PO TB12
600.0000 mg | ORAL_TABLET | Freq: Two times a day (BID) | ORAL | Status: DC
  Administered 2021-08-15 – 2021-08-18 (×6): 600 mg via ORAL
  Filled 2021-08-15 (×6): qty 1

## 2021-08-15 MED ORDER — FUROSEMIDE 10 MG/ML IJ SOLN
40.0000 mg | Freq: Once | INTRAMUSCULAR | Status: AC
  Administered 2021-08-15: 40 mg via INTRAVENOUS
  Filled 2021-08-15: qty 4

## 2021-08-15 MED ORDER — PIPERACILLIN-TAZOBACTAM 3.375 G IVPB
3.3750 g | Freq: Three times a day (TID) | INTRAVENOUS | Status: DC
  Administered 2021-08-15 – 2021-08-17 (×5): 3.375 g via INTRAVENOUS
  Filled 2021-08-15 (×6): qty 50

## 2021-08-15 NOTE — ED Notes (Addendum)
Per ED charge nurse, pt can go up to the ICU after shift change at this time.

## 2021-08-15 NOTE — ED Notes (Signed)
Spoke with Butch Penny from nuc med for GI bleed study. Gave report on pt. They will do study sometime this afternoon.

## 2021-08-15 NOTE — ED Notes (Signed)
RN asked if pt needed any cleaning up due to the possibility of incontinence. Pt declined the need.

## 2021-08-15 NOTE — ED Notes (Signed)
B12 and iron sucrose not available yet from pharmacy. Will give once sent.

## 2021-08-15 NOTE — ED Notes (Signed)
RN gave report to Allison,RN at this time.

## 2021-08-15 NOTE — Progress Notes (Signed)
PHARMACY - PHYSICIAN COMMUNICATION CRITICAL VALUE ALERT - BLOOD CULTURE IDENTIFICATION (BCID)  Courtney Mack is an 66 y.o. female who presented to [facility] on 08/14/2021 with a chief complaint of weakness, chest pain, near syncope  Assessment:  2/7 blood cx with GNR In 1/2 sets.  Nothing detected on BCID. No clear source, recent GIB in Jan with spleic flexure embolization and suspected recurrent bleed with anemia this admit  Name of physician (or Provider) Contacted: Dr Lanney Gins  Current antibiotics: none  Changes to prescribed antibiotics recommended:  Recommendations accepted by provider - start piperacillin/tazobactam  Results for orders placed or performed during the hospital encounter of 08/14/21  Blood Culture ID Panel (Reflexed) (Collected: 08/14/2021  7:26 PM)  Result Value Ref Range   Enterococcus faecalis NOT DETECTED NOT DETECTED   Enterococcus Faecium NOT DETECTED NOT DETECTED   Listeria monocytogenes NOT DETECTED NOT DETECTED   Staphylococcus species NOT DETECTED NOT DETECTED   Staphylococcus aureus (BCID) NOT DETECTED NOT DETECTED   Staphylococcus epidermidis NOT DETECTED NOT DETECTED   Staphylococcus lugdunensis NOT DETECTED NOT DETECTED   Streptococcus species NOT DETECTED NOT DETECTED   Streptococcus agalactiae NOT DETECTED NOT DETECTED   Streptococcus pneumoniae NOT DETECTED NOT DETECTED   Streptococcus pyogenes NOT DETECTED NOT DETECTED   A.calcoaceticus-baumannii NOT DETECTED NOT DETECTED   Bacteroides fragilis NOT DETECTED NOT DETECTED   Enterobacterales NOT DETECTED NOT DETECTED   Enterobacter cloacae complex NOT DETECTED NOT DETECTED   Escherichia coli NOT DETECTED NOT DETECTED   Klebsiella aerogenes NOT DETECTED NOT DETECTED   Klebsiella oxytoca NOT DETECTED NOT DETECTED   Klebsiella pneumoniae NOT DETECTED NOT DETECTED   Proteus species NOT DETECTED NOT DETECTED   Salmonella species NOT DETECTED NOT DETECTED   Serratia marcescens NOT DETECTED NOT  DETECTED   Haemophilus influenzae NOT DETECTED NOT DETECTED   Neisseria meningitidis NOT DETECTED NOT DETECTED   Pseudomonas aeruginosa NOT DETECTED NOT DETECTED   Stenotrophomonas maltophilia NOT DETECTED NOT DETECTED   Candida albicans NOT DETECTED NOT DETECTED   Candida auris NOT DETECTED NOT DETECTED   Candida glabrata NOT DETECTED NOT DETECTED   Candida krusei NOT DETECTED NOT DETECTED   Candida parapsilosis NOT DETECTED NOT DETECTED   Candida tropicalis NOT DETECTED NOT DETECTED   Cryptococcus neoformans/gattii NOT DETECTED NOT DETECTED   [name], PharmD, BCPS, BCIDP Work Cell: [phone] 08/15/2021 11:08 AM

## 2021-08-15 NOTE — Progress Notes (Signed)
Pharmacy Electrolyte Monitoring Consult:  Pharmacy consulted to assist in monitoring and replacing electrolytes in this 66 y.o. female admitted on 08/14/2021 with Weakness (Hypoglycemic 23 on arrival , now 250 per ems , general weakness )   Labs:  Sodium (mmol/L)  Date Value  08/14/2021 137   Potassium (mmol/L)  Date Value  08/14/2021 4.1   Magnesium (mg/dL)  Date Value  08/15/2021 1.6 (L)   Phosphorus (mg/dL)  Date Value  08/15/2021 3.6   Calcium (mg/dL)  Date Value  08/14/2021 8.9   Albumin (g/dL)  Date Value  08/14/2021 2.9 (L)    Assessment/Plan: Mag 1.6 - will order Magnesium sulfate 2 gm IV x 1  MD has orderded CMP, mag, Phos for am F/u electrolytes in am  Courtney Mack 08/15/2021 7:37 AM

## 2021-08-15 NOTE — ED Notes (Signed)
Per ED charge, call report and send pt to the ICU.

## 2021-08-15 NOTE — Consult Note (Signed)
Cephas Darby, MD [address]  [place]  [place], [place] [address]  Main: [phone]  Fax: [phone] Pager: [phone]   Consultation  Referring Provider:     No ref. provider found Primary Care Physician:  [facility] Primary Gastroenterologist:  Dr. Alice Reichert         Reason for Consultation:     Acute on chronic anemia  Date of Admission:  08/14/2021 Date of Consultation:  08/15/2021         HPI:   Courtney Mack is a 66 y.o. female with history of diabetes, DVT on Xarelto, hypertension, iron deficiency anemia with history of recurrent GI bleed who presented with severe symptomatic anemia and hypoglycemia.  Patient presented with generalized weakness and shortness of breath.  She is found to be COVID-positive without evidence of pneumonia.  Patient denied any rectal bleeding during this time.  She denied any abdominal pain, nausea or vomiting.  She was found to be mildly tachycardic and hypotensive, responded well to aggressive resuscitation.  Labs revealed hemoglobin of 4.6, dropped from 8.6 since 07/23/2021.  She received 2 units of PRBCs, hemoglobin improved to 6.7.  Normal platelets.  She has mildly elevated BUN/creatinine compared to baseline 24/0.71.  Has severe iron deficiency anemia, serum ferritin 6 in 12/22.  Patient Xarelto has been resumed during last hospitalization.  Patient is discharged on iron supplements.  Patient underwent CT angio abdomen and pelvis which was unremarkable.  She also underwent nuclear medicine bleeding scan which was negative.  Patient is admitted to ICU for close monitoring and currently started on remdesivir.  Patient is also started on pantoprazole drip.  Currently receiving blood transfusion  When I saw the patient in the ICU, she was lying comfortable, mild shortness of breath, she states that she noticed her stools turned black since she started taking oral iron.  Prior to that, she was having brown stools.   She is also complaining of pain in left lower quadrant which has been ongoing for last 2 months.  Patient was admitted in December secondary to severe iron deficiency anemia.  She underwent EGD and colonoscopy, video capsule endoscopy which were unremarkable for any source of bleeding.  She was subsequently admitted in January with rectal bleeding and acute blood loss anemia.  She underwent bleeding scan which was positive for active extravasation in the splenic flexure of the colon and therefore underwent embolization.   NSAIDs: None  Antiplts/Anticoagulants/Anti thrombotics: Xarelto for history of DVT  GI Procedures: Reviewed under procedures tab  Past Medical History:  Diagnosis Date   Diabetes mellitus without complication ([place])    DVT (deep venous thrombosis) ([place]) 09/06/2020   High cholesterol    Hx of blood clots    Hypertension    IDA (iron deficiency anemia) 09/21/2020    Past Surgical History:  Procedure Laterality Date   COLONOSCOPY N/A 06/21/2021   Procedure: COLONOSCOPY;  Surgeon: Toledo, Benay Pike, MD;  Location: [facility];  Service: Gastroenterology;  Laterality: N/A;   ESOPHAGOGASTRODUODENOSCOPY N/A 06/21/2021   Procedure: ESOPHAGOGASTRODUODENOSCOPY (EGD);  Surgeon: Toledo, Benay Pike, MD;  Location: [facility] END[facility]COPY;  Service: Gastroenterology;  Laterality: N/A;   GIVENS CAPSULE STUDY N/A 06/22/2021   Procedure: GIVENS CAPSULE STUDY;  Surgeon: Toledo, Benay Pike, MD;  Location: [facility] ENDOSCOPY;  Service: Gastroenterology;  Laterality: N/A;   IVC FILTER INSERTION N/A 08/18/2020   Procedure: IVC FILTER INSERTION;  Surgeon: Katha Cabal, MD;  Location: [facility];  Service: Cardiovascular;  Laterality: N/A;   PERIPHERAL VASCULAR THROMBECTOMY Bilateral 01/03/2021   Procedure: PERIPHERAL VASCULAR THROMBECTOMY;  Surgeon: Algernon Huxley, MD;  Location: [facility];  Service: Cardiovascular;  Laterality: Bilateral;   PULMONARY THROMBECTOMY N/A  08/18/2020   Procedure: PULMONARY THROMBECTOMY;  Surgeon: Katha Cabal, MD;  Location: [facility];  Service: Cardiovascular;  Laterality: N/A;   TUBAL LIGATION     VISCERAL ANGIOGRAPHY N/A 07/18/2021   Procedure: VISCERAL ANGIOGRAPHY;  Surgeon: Algernon Huxley, MD;  Location: [facility];  Service: Cardiovascular;  Laterality: N/A;    Prior to Admission medications   Medication Sig Start Date End Date Taking? Authorizing Provider  DULERA 100-5 MCG/ACT AERO Inhale 2 puffs into the lungs 2 (two) times daily. 11/22/20  Yes [provider]  Ferrous Sulfate (IRON) 325 (65 Fe) MG TABS Take 325 mg by mouth every other day. 07/05/21  Yes [provider]  loperamide (IMODIUM) 2 MG capsule Take 1 capsule (2 mg total) by mouth as needed for diarrhea or loose stools. 06/26/21  Yes Sreenath, Sudheer B, MD  losartan (COZAAR) 25 MG tablet Take 1 tablet (25 mg total) by mouth daily. Patient taking differently: Take 25 mg by mouth in the morning and at bedtime. 10/03/20  Yes Earlie Server, MD  metFORMIN (GLUCOPHAGE) 500 MG tablet Take 1 tablet (500 mg total) by mouth 2 (two) times daily with a meal. 10/03/20  Yes Earlie Server, MD  Multiple Vitamin (MULTIVITAMIN WITH MINERALS) TABS tablet Take 1 tablet by mouth daily. 09/12/20  Yes [name], MD  pantoprazole (PROTONIX) 40 MG tablet Take 1 tablet (40 mg total) by mouth daily. 07/23/21 08/22/21 Yes Sreenath, Trula Slade, MD  rivaroxaban (XARELTO) 20 MG TABS tablet Take 1 tablet (20 mg total) by mouth daily with supper. 01/25/21  Yes Earlie Server, MD  rosuvastatin (CRESTOR) 10 MG tablet Take 1 tablet (10 mg total) by mouth daily. 10/03/20  Yes Earlie Server, MD  simethicone (MYLICON) 80 MG chewable tablet Chew 1 tablet (80 mg total) by mouth 4 (four) times daily. 06/26/21  Yes Sidney Ace, MD   Current Facility-Administered Medications:    albuterol (VENTOLIN HFA) 108 (90 Base) MCG/ACT inhaler 2 puff, 2 puff, Inhalation, Q6H, Ouma,  Bing Neighbors, NP, 2 puff at 08/15/21 0801   ascorbic acid (VITAMIN C) tablet 500 mg, 500 mg, Oral, Daily, Ouma, Bing Neighbors, NP, 500 mg at 08/15/21 0926   [START ON 08/16/2021] Chlorhexidine Gluconate Cloth 2 % PADS 6 each, 6 each, Topical, Q0600, Ottie Glazier, MD, 6 each at 08/15/21 1748   chlorpheniramine-HYDROcodone 10-8 MG/5ML suspension 5 mL, 5 mL, Oral, Q12H PRN, Lang Snow, NP, 5 mL at 08/15/21 9509   docusate sodium (COLACE) capsule 100 mg, 100 mg, Oral, BID PRN, Lang Snow, NP   guaiFENesin (MUCINEX) 12 hr tablet 600 mg, 600 mg, Oral, BID, Ouma, Bing Neighbors, NP, 600 mg at 08/15/21 0926   insulin aspart (novoLOG) injection 0-9 Units, 0-9 Units, Subcutaneous, Q4H, Ouma, Bing Neighbors, NP   [START ON 08/18/2021] pantoprazole (PROTONIX) injection 40 mg, 40 mg, Intravenous, Q12H, Cuthriell, Charline Bills, PA-C   pantoprozole (PROTONIX) 80 mg /NS 100 mL infusion, 8 mg/hr, Intravenous, Continuous, Cuthriell, Charline Bills, PA-C, Last Rate: 10 mL/hr at 08/15/21 1637, 8 mg/hr at 08/15/21 1637   piperacillin-tazobactam (ZOSYN) IVPB 3.375 g, 3.375 g, Intravenous, Q8H, Ottie Glazier, MD, Stopped at 08/15/21 1806   polyethylene glycol (MIRALAX / GLYCOLAX) packet 17 g, 17  g, Oral, Daily PRN, Lang Snow, NP   [COMPLETED] remdesivir 200 mg in sodium chloride 0.9% 250 mL IVPB, 200 mg, Intravenous, Once, Stopped at 08/15/21 0404 **FOLLOWED BY** [START ON 08/16/2021] remdesivir 100 mg in sodium chloride 0.9 % 100 mL IVPB, 100 mg, Intravenous, Daily, Ouma, Bing Neighbors, NP   vitamin B-12 (CYANOCOBALAMIN) tablet 1,000 mcg, 1,000 mcg, Oral, Daily, Zakirah Weingart, Tally Due, MD, 1,000 mcg at 08/15/21 1028   zinc sulfate capsule 220 mg, 220 mg, Oral, Daily, Ouma, Bing Neighbors, NP, 220 mg at 08/15/21 0349   Family History  Problem Relation Age of Onset   Hypertension Mother    Diabetes Mother    Diabetes Father    Hypertension Father     High Cholesterol Father    Congestive Heart Failure Father    Breast cancer Cousin      Social History   Tobacco Use   Smoking status: Former    Packs/day: 0.25    Years: 0.50    Pack years: 0.13    Types: Cigarettes    Quit date: 09/21/1980    Years since quitting: 40.9   Smokeless tobacco: Never   Tobacco comments:    smoked 2-4 cigarettes for about 2-3 weeks   Vaping Use   Vaping Use: Never used  Substance Use Topics   Alcohol use: Not Currently   Drug use: Not Currently    Allergies as of 08/14/2021   (No Known Allergies)    Review of Systems:    All systems reviewed and negative except where noted in HPI.   Physical Exam:  Vital signs in last 24 hours: Temp:  [98.3 F (36.8 C)-98.7 F (37.1 C)] 98.5 F (36.9 C) (02/08 1825) Pulse Rate:  [76-100] 87 (02/08 1825) Resp:  [15-28] 20 (02/08 1825) BP: (87-136)/(40-82) 117/50 (02/08 1825) SpO2:  [91 %-100 %] 96 % (02/08 1825) Weight:  [99.3 kg] 99.3 kg (02/08 1745)   General:   Pleasant, cooperative in mild respiratory distress Head:  Normocephalic and atraumatic. Eyes:   No icterus.   Conjunctiva pink. PERRLA. Ears:  Normal auditory acuity. Neck:  Supple; no masses or thyroidomegaly Lungs: Respirations even and unlabored. Lungs clear to auscultation bilaterally.   No wheezes, crackles, or rhonchi.  Heart:  Regular rate and rhythm;  Without murmur, clicks, rubs or gallops Abdomen:  Soft, nondistended, left lower quadrant tenderness. Normal bowel sounds. No appreciable masses or hepatomegaly.  No rebound or guarding.  Rectal:  Not performed. Msk:  Symmetrical without gross deformities.  Strength generalized weakness Extremities:  Without edema, cyanosis or clubbing. Neurologic:  Alert and oriented x3;  grossly normal neurologically. Skin:  Intact without significant lesions or rashes. Psych:  Alert and cooperative. Normal affect.  LAB RESULTS: CBC Latest Ref Rng & Units 08/15/2021 08/15/2021 08/14/2021   WBC 4.0 - 10.5 K/uL - 4.0 4.8  Hemoglobin 12.0 - 15.0 g/dL 6.7(L) 5.2(L) 4.6(LL)  Hematocrit 36.0 - 46.0 % 21.5(L) 17.9(L) 16.4(L)  Platelets 150 - 400 K/uL - 249 288    BMET BMP Latest Ref Rng & Units 08/14/2021 07/19/2021 07/18/2021  Glucose 70 - 99 mg/dL 131(H) 97 99  BUN 8 - 23 mg/dL 24(H) 19 26(H)  Creatinine 0.44 - 1.00 mg/dL 0.71 0.55 0.69  Sodium 135 - 145 mmol/L 137 138 136  Potassium 3.5 - 5.1 mmol/L 4.1 3.2(L) 3.7  Chloride 98 - 111 mmol/L 106 105 104  CO2 22 - 32 mmol/L 25 23 26   Calcium 8.9 - 10.3 mg/dL 8.9 8.4(L)  9.0    LFT Hepatic Function Latest Ref Rng & Units 08/14/2021 06/20/2021 01/15/2021  Total Protein 6.5 - 8.1 g/dL 6.2(L) 6.6 7.8  Albumin 3.5 - 5.0 g/dL 2.9(L) 2.8(L) 3.2(L)  AST 15 - 41 U/L 19 14(L) 20  ALT 0 - 44 U/L 9 8 12   Alk Phosphatase 38 - 126 U/L 38 49 57  Total Bilirubin 0.3 - 1.2 mg/dL 0.6 1.8(H) 0.6     STUDIES: DG Chest 2 View  Result Date: 08/14/2021 CLINICAL DATA:  Weakness, shortness of breath EXAM: CHEST - 2 VIEW COMPARISON:  07/18/2021 FINDINGS: The heart size and mediastinal contours are within normal limits. Both lungs are clear. The visualized skeletal structures are unremarkable. IMPRESSION: No active cardiopulmonary disease. Electronically Signed   By: Rolm Baptise M.D.   On: 08/14/2021 20:20   CT HEAD WO CONTRAST (5MM)  Result Date: 08/14/2021 CLINICAL DATA:  Headache, new or worsening (Age >= 50y). Hypoglycemia. EXAM: CT HEAD WITHOUT CONTRAST TECHNIQUE: Contiguous axial images were obtained from the base of the skull through the vertex without intravenous contrast. RADIATION DOSE REDUCTION: This exam was performed according to the departmental dose-optimization program which includes automated exposure control, adjustment of the mA and/or kV according to patient size and/or use of iterative reconstruction technique. COMPARISON:  06/20/2021 FINDINGS: Brain: No acute intracranial abnormality. Specifically, no hemorrhage, hydrocephalus, mass  lesion, acute infarction, or significant intracranial injury. Vascular: No hyperdense vessel or unexpected calcification. Skull: No acute calvarial abnormality. Sinuses/Orbits: No acute findings. Mucosal thickening throughout the ethmoid air cells. Other: None IMPRESSION: No acute intracranial abnormality. Electronically Signed   By: Rolm Baptise M.D.   On: 08/14/2021 20:09   NM GI Blood Loss  Result Date: 08/15/2021 CLINICAL DATA:  Recurrent GI bleeding with anemia despite transfusions. EXAM: NUCLEAR MEDICINE GASTROINTESTINAL BLEEDING SCAN TECHNIQUE: Sequential abdominal images were obtained following intravenous administration of Tc-49mlabeled red blood cells. RADIOPHARMACEUTICALS:  20.0 mCi Tc-915mertechnetate in-vitro labeled red cells. COMPARISON:  Abdominal CTA 08/14/2021. Nuclear medicine GI bleeding study 07/18/2021. FINDINGS: Anterior imaging was performed through 2 hours. No active gastrointestinal bleeding is identified. There is progressive activity within the perineum, well inferior to the bladder, similar to previous study and probably urine contamination. There is normal blood pool activity. Atypical configuration of activity in the left upper quadrant of the abdomen is non moveable and unchanged from the previous bleeding study, likely due to an overlap of cardiac and splenic activity. IMPRESSION: No evidence of active gastrointestinal bleeding. Electronically Signed   By: WiRichardean Sale.D.   On: 08/15/2021 16:08   CT Angio Abd/Pel W and/or Wo Contrast  Result Date: 08/14/2021 CLINICAL DATA:  Lower GI bleed symptomatic anemia EXAM: CTA ABDOMEN AND PELVIS WITHOUT AND WITH CONTRAST TECHNIQUE: Multidetector CT imaging of the abdomen and pelvis was performed using the standard protocol during bolus administration of intravenous contrast. Multiplanar reconstructed images and MIPs were obtained and reviewed to evaluate the vascular anatomy. RADIATION DOSE REDUCTION: This exam was performed  according to the departmental dose-optimization program which includes automated exposure control, adjustment of the mA and/or kV according to patient size and/or use of iterative reconstruction technique. CONTRAST:  10058mMNIPAQUE IOHEXOL 350 MG/ML SOLN COMPARISON:  RBC scan 07/18/2021, CT 06/20/2021 FINDINGS: VASCULAR Aorta: Mild atherosclerosis. No aneurysm, dissection, stenosis or occlusion Celiac: Moderate calcification at the origin without high-grade stenosis. Distal vascular patency. No aneurysm. SMA: Origin calcification. Mild mural thickening at the origin of the SMA results in mild stenosis. No aneurysm. Renals: Single  right and single left renal arteries with calcification at the origins. Suspect mild stenosis of the proximal right renal artery just distal to the origin. IMA: Mild to moderate stenosis at the origin but widely patent distally. Inflow: Mild atherosclerosis. No aneurysm dissection or occlusive disease. Proximal Outflow: Bilateral common femoral and visualized portions of the superficial and profunda femoral arteries are patent without evidence of aneurysm, dissection, vasculitis or significant stenosis. Veins: IVC filter at the L3 level. Diminutive appearing distal IVC and common iliac veins possibly due to chronic occlusion. Numerous collateral vessels in the retroperitoneum and anterior abdominal wall. Review of the MIP images confirms the above findings. NON-VASCULAR Lower chest: 6 lung bases demonstrate no acute consolidation or effusion. Normal cardiac size. Trace pericardial effusion Hepatobiliary: No focal liver abnormality is seen. No gallstones, gallbladder wall thickening, or biliary dilatation. Pancreas: Unremarkable. No pancreatic ductal dilatation or surrounding inflammatory changes. Spleen: Normal in size without focal abnormality. Adrenals/Urinary Tract: Adrenal glands are normal. No hydronephrosis. Probable small parapelvic cysts. The bladder is nearly empty Stomach/Bowel:  The stomach is nonenlarged. Radiodense material within the colon. No acute bowel wall thickening. Negative appendix. No intraluminal extravasation of contrast Lymphatic: No increasing lymph nodes. Borderline retroperitoneal lymph nodes without change. Reproductive: Uterus and bilateral adnexa are unremarkable. Other: Negative for pelvic effusion or free air. Generalized subcutaneous edema consistent with anasarca Musculoskeletal: No acute osseous abnormality. Severe degenerative changes of the bilateral hips. IMPRESSION: VASCULAR 1. Negative for active GI bleeding. 2. Atherosclerotic vascular disease without evidence for high-grade stenosis or acute occlusive disease. NON-VASCULAR 1. No CT evidence for acute intra-abdominal or pelvic abnormality. 2. Generalized subcutaneous edema consistent with anasarca. Electronically Signed   By: Donavan Foil M.D.   On: 08/14/2021 23:20      Impression / Plan:   WANETTE ROBISON is a 66 y.o. female with metabolic syndrome, history of chronic iron deficiency anemia, history of lower GI bleed or splenic flexure s/p embolization in 05/2022, underwent bidirectional endoscopy, video capsule study which were otherwise unremarkable in 06/2021 presented with severe symptomatic anemia, shortness of breath and severe hypoglycemia found to be COVID-positive  Severe symptomatic anemia with no evidence of overt GI bleed Elevated BUN/creatinine Continue Protonix drip Patient is responding appropriately to blood transfusion and resuscitation Recommend EGD/push enteroscopy when patient is adequately resuscitated, tentative plan on Friday Okay with clear liquid diet Recommend parenteral iron therapy Recommend vitamin B12 daily  SARS COVID-19 infection Started on remdesivir No evidence of pneumonia on chest x-ray  Thank you for involving me in the care of this patient.  GI will follow along with you    LOS: 1 day   Sherri Sear, MD  08/15/2021, 7:10 PM    Note: This  dictation was prepared with Dragon dictation along with smaller phrase technology. Any transcriptional errors that result from this process are unintentional.

## 2021-08-15 NOTE — ED Notes (Signed)
Per night RN who gav ereport to this RN, second unit of blood (order) was released by her but unit of blood was not ready yet. Awaiting notification from blood bank.

## 2021-08-15 NOTE — ED Notes (Signed)
Pt transported to [facility] for testing

## 2021-08-15 NOTE — ED Notes (Signed)
Sent message to attending to clarify NPO status, whether strict NPO or NPO with meds.

## 2021-08-15 NOTE — H&P (Signed)
NAME:  Courtney Mack, MRN:  [id number], DOB:  December 26, 1955, LOS: 1 ADMISSION DATE:  08/14/2021, CONSULTATION DATE:  08/14/2021 REFERRING MD:  Betha Loa PA CHIEF COMPLAINT: SOB    HPI  66 y.o with significant PMH of DVT/PE on Xarelto, recurrent GI Bleed, HTN, HLD, DM, Iron Deficiency Anemia, obesity, Endometrial Cancer, dCHF, cellulitis of Left Lower Leg who presented to the ED with chief complaints of weakness, dizziness with near syncope, chest pain, shortness of breath and hypoglycemia.  Per ED notes and EMS run sheet, patient called EMS due to complaints of chest pain.  On EMS arrival, patient was found to be tachycardic and hypoglycemic with blood sugar readings of 23.  Patient was given D10 with improvement in her blood sugar to 250.  Patient also reported near syncopal event associated with shortness of breath, palpitation, and generalized weakness.  On review of her chart, patient was recently admitted with symptomatic anemia on 07/18/2021.  She was found to have a hemoglobin of 4.8 and transfused with 3 units of PRBC.  NM bleeding scan was performed at that time which was positive for GI bleed at the splenic flexure status post embolization with vascular surgery on 1/11.  Patient's hemoglobin was stabilized at 8.5 on 1/14 therefore she was restarted on Xarelto and discharged on 01/16.  ED Course: On arrival to the ED, she was afebrile with blood pressure 99/46 mm Hg and pulse rate  105beats/min, respirations 30 breaths/min, sats 98% on 2L.  Noncontrast CT head was obtained and showed no acute intracranial abnormality.  CT angio abdomen pelvis negative for active GI bleed or acute intra-abdominal or pelvic abnormality.  Generalized subcutaneous edema consistent with anasarca noted. Pertinent Labs in Red/Diagnostics Findings: Na+/ K+: 137/4.1 Glucose: 131 BUN/Cr.: 24/0.71   WBC/ TMAX: 4.8/ afebrile Hgb/Hct: 4.6/16.4 Plts: 288 PCT: negative <0.10 Lactic acid: 1.6 COVID PCR:  positive Troponin: 6  PCCM consulted for admission and further management Past Medical History  DVT/PE on Xarelto, recurrent GI Bleed, HTN, HLD, DM, Iron Deficiency Anemia, obesity, Endometrial Cancer, dCHF, cellulitis of Left Lower Leg   Significant Hospital Events   2/8: Admitted to the ICU with symptomatic Anemia secondary to Acute on Chronic GI bleed  Consults:  GI  Procedures:  None  Significant Diagnostic Tests:  2/7: Chest Xray> no active cardiopulmonary disease 2/7: Noncontrast CT head> no acute intracranial abnormality 2/7: CTA abdomen and pelvis> no active GI bleeding, no CT evidence for acute intra-abdominal or pelvic abnormality.  Generalized subcutaneous edema consistent with anasarca  Micro Data:  2/7: SARS-CoV-2 PCR> positive 2/7: Influenza PCR> negative 2/7: Blood culture x2> 2/7: Urine Culture> 2/7: MRSA PCR>>   Antimicrobials:  None  OBJECTIVE  Blood pressure (!) 100/49, pulse 87, temperature 99.5 F (37.5 C), temperature source Axillary, resp. rate (!) 28, height 4\' 11"  (1.499 m), weight 104 kg, SpO2 100 %.        Intake/Output Summary (Last 24 hours) at 08/15/2021 0018 Last data filed at 08/14/2021 2357 Gross per 24 hour  Intake 1177.15 ml  Output --  Net 1177.15 ml   Filed Weights   08/14/21 1842  Weight: 104 kg    Physical Examination  GENERAL: 66 year-old critically ill patient lying in the bed with no acute distress.  EYES: Pupils equal, round, reactive to light and accommodation. No scleral icterus. Extraocular muscles intact.  HEENT: Head atraumatic, normocephalic. Oropharynx and nasopharynx clear.  NECK:  Supple, no jugular venous distention. No thyroid enlargement, no tenderness.  LUNGS:  Decreased breath sounds bilaterally, moderate wheezing, rales,and rhonchi. No use of accessory muscles of respiration.  CARDIOVASCULAR: S1, S2 normal. No murmurs, rubs, or gallops.  ABDOMEN: Soft, nontender, nondistended. Bowel sounds present. No  organomegaly or mass. Anasarca EXTREMITIES: Bilateral +4 pitting edema of lower extremities, cyanosis, or clubbing.  NEUROLOGIC: Cranial nerves II through XII are intact.  Muscle strength 4/5 in all extremities. Sensation intact. Gait not checked.  PSYCHIATRIC: The patient is alert and oriented x 3.  SKIN: No obvious rash, lesion, or ulcer.     Labs/imaging that I havepersonally reviewed  (right click and "Reselect all SmartList Selections" daily)     Labs   CBC: Recent Labs  Lab 08/14/21 1845  WBC 4.8  NEUTROABS 3.4  HGB 4.6*  HCT 16.4*  MCV 91.1  PLT 132    Basic Metabolic Panel: Recent Labs  Lab 08/14/21 1845  NA 137  K 4.1  CL 106  CO2 25  GLUCOSE 131*  BUN 24*  CREATININE 0.71  CALCIUM 8.9   GFR: Estimated Creatinine Clearance: 73.7 mL/min (by C-G formula based on SCr of 0.71 mg/dL). Recent Labs  Lab 08/14/21 1845 08/14/21 1926  WBC 4.8  --   LATICACIDVEN  --  1.1   1.6    Liver Function Tests: Recent Labs  Lab 08/14/21 1845  AST 19  ALT 9  ALKPHOS 38  BILITOT 0.6  PROT 6.2*  ALBUMIN 2.9*   No results for input(s): LIPASE, AMYLASE in the last 168 hours. No results for input(s): AMMONIA in the last 168 hours.  ABG No results found for: PHART, PCO2ART, PO2ART, HCO3, TCO2, ACIDBASEDEF, O2SAT   Coagulation Profile: Recent Labs  Lab 08/14/21 2334  INR 1.1    Cardiac Enzymes: No results for input(s): CKTOTAL, CKMB, CKMBINDEX, TROPONINI in the last 168 hours.  HbA1C: Hgb A1c MFr Bld  Date/Time Value Ref Range Status  01/02/2021 01:58 PM 6.2 (H) 4.8 - 5.6 % Final    Comment:    (NOTE)         Prediabetes: 5.7 - 6.4         Diabetes: >6.4         Glycemic control for adults with diabetes: <7.0   08/19/2020 10:17 AM 6.6 (H) 4.8 - 5.6 % Final    Comment:    (NOTE) Pre diabetes:          5.7%-6.4%  Diabetes:              >6.4%  Glycemic control for   <7.0% adults with diabetes     CBG: No results for input(s): GLUCAP in the  last 168 hours.  Review of Systems:   Review of Systems  Constitutional:  Positive for malaise/fatigue.  HENT:  Positive for congestion and sore throat.   Eyes: Negative.   Respiratory:  Positive for cough, sputum production, shortness of breath and wheezing.   Cardiovascular:  Positive for chest pain, palpitations and leg swelling.  Gastrointestinal:  Positive for melena.  Genitourinary: Negative.   Musculoskeletal: Negative.   Neurological:  Positive for dizziness and headaches.  Endo/Heme/Allergies: Negative.   Psychiatric/Behavioral: Negative.      Past Medical History  She,  has a past medical history of Diabetes mellitus without complication ([place]), DVT (deep venous thrombosis) ([place]) (09/06/2020), High cholesterol, blood clots, Hypertension, and IDA (iron deficiency anemia) (09/21/2020).   Surgical History    Past Surgical History:  Procedure Laterality Date   COLONOSCOPY N/A 06/21/2021   Procedure: COLONOSCOPY;  Surgeon: Toledo, Benay Pike, MD;  Location: [facility] ENDOSCOPY;  Service: Gastroenterology;  Laterality: N/A;   ESOPHAGOGASTRODUODENOSCOPY N/A 06/21/2021   Procedure: ESOPHAGOGASTRODUODENOSCOPY (EGD);  Surgeon: Toledo, Benay Pike, MD;  Location: [facility] ENDOSCOPY;  Service: Gastroenterology;  Laterality: N/A;   GIVENS CAPSULE STUDY N/A 06/22/2021   Procedure: GIVENS CAPSULE STUDY;  Surgeon: Toledo, Benay Pike, MD;  Location: [facility] ENDOSCOPY;  Service: Gastroenterology;  Laterality: N/A;   IVC FILTER INSERTION N/A 08/18/2020   Procedure: IVC FILTER INSERTION;  Surgeon: Katha Cabal, MD;  Location: [place] [facility];  Service: Cardiovascular;  Laterality: N/A;   PERIPHERAL VASCULAR THROMBECTOMY Bilateral 01/03/2021   Procedure: PERIPHERAL VASCULAR THROMBECTOMY;  Surgeon: Algernon Huxley, MD;  Location: [place] [facility];  Service: Cardiovascular;  Laterality: Bilateral;   PULMONARY THROMBECTOMY N/A 08/18/2020   Procedure: PULMONARY THROMBECTOMY;  Surgeon: Katha Cabal,  MD;  Location: [place] [facility];  Service: Cardiovascular;  Laterality: N/A;   TUBAL LIGATION     VISCERAL ANGIOGRAPHY N/A 07/18/2021   Procedure: VISCERAL ANGIOGRAPHY;  Surgeon: Algernon Huxley, MD;  Location: [place] [facility];  Service: Cardiovascular;  Laterality: N/A;     Social History   reports that she quit smoking about 40 years ago. Her smoking use included cigarettes. She has a 0.13 pack-year smoking history. She has never used smokeless tobacco. She reports that she does not currently use alcohol. She reports that she does not currently use drugs.   Family History   Her family history includes Breast cancer in her cousin; Congestive Heart Failure in her father; Diabetes in her father and mother; High Cholesterol in her father; Hypertension in her father and mother.   Allergies No Known Allergies   Home Medications  Prior to Admission medications   Medication Sig Start Date End Date Taking? Authorizing Provider  DULERA 100-5 MCG/ACT AERO Inhale 2 puffs into the lungs 2 (two) times daily. 11/22/20   [provider]  Ferrous Sulfate (IRON) 325 (65 Fe) MG TABS Take 325 mg by mouth every other day. 07/05/21   [provider]  loperamide (IMODIUM) 2 MG capsule Take 1 capsule (2 mg total) by mouth as needed for diarrhea or loose stools. 06/26/21   Sidney Ace, MD  losartan (COZAAR) 25 MG tablet Take 1 tablet (25 mg total) by mouth daily. Patient taking differently: Take 25 mg by mouth in the morning and at bedtime. 10/03/20   Earlie Server, MD  metFORMIN (GLUCOPHAGE) 500 MG tablet Take 1 tablet (500 mg total) by mouth 2 (two) times daily with a meal. 10/03/20   Earlie Server, MD  Multiple Vitamin (MULTIVITAMIN WITH MINERALS) TABS tablet Take 1 tablet by mouth daily. 09/12/20   [name], MD  pantoprazole (PROTONIX) 40 MG tablet Take 1 tablet (40 mg total) by mouth daily. 07/23/21 08/22/21  Sidney Ace, MD  rivaroxaban (XARELTO) 20 MG TABS tablet Take 1  tablet (20 mg total) by mouth daily with supper. 01/25/21   Earlie Server, MD  rosuvastatin (CRESTOR) 10 MG tablet Take 1 tablet (10 mg total) by mouth daily. 10/03/20   Earlie Server, MD  simethicone (MYLICON) 80 MG chewable tablet Chew 1 tablet (80 mg total) by mouth 4 (four) times daily. 06/26/21   Sidney Ace, MD   Scheduled Meds:  [START ON 08/18/2021] pantoprazole  40 mg Intravenous Q12H   Continuous Infusions:  sodium chloride     pantoprazole 8 mg/hr (08/14/21 2359)   PRN Meds:.docusate sodium, polyethylene glycol  Active Hospital Problem list   Symptomatic anemia secondary to GI bleed COVID infection DVT/PE dCHF DM  Assessment & Plan:  Symptomatic Anemia secondary to Acute on Chronic GI Bleed  Recent GI bleed with positive bleeding scan 1/11 at level of splenic flexure s/p embolization with Vascular surgery -CT Abd/pelvis with no source of bleeding -IVF resuscitation to maintain MAP>65 -H&H monitoring q6h -Transfuse PRN Hgb<7 -Pantoprazole 80mg  IV x1 then gtt 8mg /hr -NPO for pending endoscopy -GI Consult for EGD/Colonoscopy -Will obtain Bleeding Scan -Helicobacter pylori Ab + stool Ag.  -Hold NSAIDs, steroids, ASA  COVID 19 Infection  Asymptomatic without Active Pneumonia -Supplemental O2 as needed to maintain O2 saturations 88 to 92% -Ensure adequate pulmonary hygiene  -F/u cultures, trend PCT, wbc, monitor fever curve -Encourage OOB, IS, FV, and awake proning if able -Continue airborne, contact precautions for 21 days from positive testing. -Remdesivir, plan for 5 days -Steroids -Bronchodilators and Pulmicort nebs -Follow inflammatory markers: Ferritin, D-dimer, CRP -Vitamin C, zinc  Chronic HFpEF (last known EF 55%) -Hypertension Hx: HLD  -Continuous cardiac monitoring -Maintain MAP greater than 65 -Continue Rosuvastatin once able to take po -Slightly congested and overloaded post PRBCs will give 40 mg IV Lasix x 1 -Hold Losartan in the setting of  hypotension -Repeat 2D Echocardiogram   Diabetes mellitus Now with episode of hypoglycemia  BG 23 on presentation -CBGs -Sliding scale insulin -Follow ICU hyper/hypoglycemia protocol -Hold home Metformin   Hx of Bilateral DVT/PE -Hold Xarelto for now   Iron deficiency anemia due to chronic blood loss -CBC w/auto Differential  -Ferritin -Iron Panel  Best practice:  Diet:  NPO Pain/Anxiety/Delirium protocol (if indicated): No VAP protocol (if indicated): Not indicated DVT prophylaxis: Contraindicated GI prophylaxis: PPI Glucose control:  SSI Yes Central venous access:  N/A Arterial line:  N/A Foley:  Yes, and it is still needed Mobility:  bed rest  PT consulted: N/A Last date of multidisciplinary goals of care discussion [2/7] Code Status:  full code Disposition: ICU   = Goals of Care = Code Status Order: FULL  Primary Emergency Contact: VIARS,JOANIE L Wishes to pursue full aggressive treatment and intervention options, including CPR and intubation, but goals of care will be addressed on going with  patient and family if that should become necessary.  Critical care time: 45 minutes     Rufina Falco, DNP, CCRN, FNP-C, AGACNP-BC Acute Care Nurse Practitioner  [facility] Pager: [phone] [place] [facility]  .

## 2021-08-15 NOTE — ED Notes (Addendum)
Blood bank called to check on status of blood. States it will be approx 30 minutes. Pt updated on delay with verbal understanding.

## 2021-08-15 NOTE — ED Notes (Signed)
Called blood bank to see if second unit blood ready (this RN was not notified).   It is ready. Will pick up shortly.

## 2021-08-15 NOTE — ED Notes (Signed)
Pt return back to room from [place].

## 2021-08-15 NOTE — ED Notes (Signed)
Second unit blood infusing, changed rate from 200 to 250 mL/hr. Will administer iron sucrose through same iV after blood is complete. Iron sucrose is not compatible with protonix or blood.

## 2021-08-15 NOTE — ED Notes (Signed)
Pt satting 100% on 1L. Placed on room air.

## 2021-08-15 NOTE — Progress Notes (Signed)
Remdesivir - Pharmacy Brief Note   O:  CXR: "No active cardiopulmonary disease." SpO2: 98-100% on RA   A/P:  Remdesivir 200 mg IVPB once followed by 100 mg IVPB daily x 4 days.   Renda Rolls, PharmD, MBA 08/15/2021 2:02 AM

## 2021-08-15 NOTE — ED Notes (Signed)
RN contacted provider about down grading pt to another unit. RN notified charge nurse about situation.

## 2021-08-16 ENCOUNTER — Other Ambulatory Visit: Payer: Self-pay

## 2021-08-16 DIAGNOSIS — I2782 Chronic pulmonary embolism: Secondary | ICD-10-CM

## 2021-08-16 DIAGNOSIS — D649 Anemia, unspecified: Secondary | ICD-10-CM

## 2021-08-16 DIAGNOSIS — E119 Type 2 diabetes mellitus without complications: Secondary | ICD-10-CM

## 2021-08-16 DIAGNOSIS — U071 COVID-19: Secondary | ICD-10-CM

## 2021-08-16 DIAGNOSIS — D62 Acute posthemorrhagic anemia: Secondary | ICD-10-CM

## 2021-08-16 LAB — CBC WITH DIFFERENTIAL/PLATELET
Abs Immature Granulocytes: 0.03 10*3/uL (ref 0.00–0.07)
Basophils Absolute: 0 10*3/uL (ref 0.0–0.1)
Basophils Relative: 1 %
Eosinophils Absolute: 0.3 10*3/uL (ref 0.0–0.5)
Eosinophils Relative: 8 %
HCT: 24.5 % — ABNORMAL LOW (ref 36.0–46.0)
Hemoglobin: 7.8 g/dL — ABNORMAL LOW (ref 12.0–15.0)
Immature Granulocytes: 1 %
Lymphocytes Relative: 12 %
Lymphs Abs: 0.5 10*3/uL — ABNORMAL LOW (ref 0.7–4.0)
MCH: 27.3 pg (ref 26.0–34.0)
MCHC: 31.8 g/dL (ref 30.0–36.0)
MCV: 85.7 fL (ref 80.0–100.0)
Monocytes Absolute: 0.3 10*3/uL (ref 0.1–1.0)
Monocytes Relative: 8 %
Neutro Abs: 2.9 10*3/uL (ref 1.7–7.7)
Neutrophils Relative %: 70 %
Platelets: 245 10*3/uL (ref 150–400)
RBC: 2.86 MIL/uL — ABNORMAL LOW (ref 3.87–5.11)
RDW: 16.7 % — ABNORMAL HIGH (ref 11.5–15.5)
WBC: 4.1 10*3/uL (ref 4.0–10.5)
nRBC: 0.5 % — ABNORMAL HIGH (ref 0.0–0.2)

## 2021-08-16 LAB — COMPREHENSIVE METABOLIC PANEL
ALT: 9 U/L (ref 0–44)
AST: 20 U/L (ref 15–41)
Albumin: 2.7 g/dL — ABNORMAL LOW (ref 3.5–5.0)
Alkaline Phosphatase: 33 U/L — ABNORMAL LOW (ref 38–126)
Anion gap: 7 (ref 5–15)
BUN: 15 mg/dL (ref 8–23)
CO2: 26 mmol/L (ref 22–32)
Calcium: 8 mg/dL — ABNORMAL LOW (ref 8.9–10.3)
Chloride: 103 mmol/L (ref 98–111)
Creatinine, Ser: 0.59 mg/dL (ref 0.44–1.00)
GFR, Estimated: >60 mL/min (ref >60)
Glucose, Bld: 87 mg/dL (ref 70–99)
Potassium: 3.5 mmol/L (ref 3.5–5.1)
Sodium: 136 mmol/L (ref 135–145)
Total Bilirubin: 1.5 mg/dL — ABNORMAL HIGH (ref 0.3–1.2)
Total Protein: 5.6 g/dL — ABNORMAL LOW (ref 6.5–8.1)

## 2021-08-16 LAB — D-DIMER, QUANTITATIVE: D-Dimer, Quant: 1.34 ug/mL-FEU — ABNORMAL HIGH (ref 0.00–0.50)

## 2021-08-16 LAB — GLUCOSE, CAPILLARY
Glucose-Capillary: 83 mg/dL (ref 70–99)
Glucose-Capillary: 94 mg/dL (ref 70–99)
Glucose-Capillary: 88 mg/dL (ref 70–99)
Glucose-Capillary: 104 mg/dL — ABNORMAL HIGH (ref 70–99)

## 2021-08-16 LAB — FERRITIN: Ferritin: 136 ng/mL (ref 11–307)

## 2021-08-16 LAB — PHOSPHORUS: Phosphorus: 3.4 mg/dL (ref 2.5–4.6)

## 2021-08-16 LAB — C-REACTIVE PROTEIN: CRP: 5 mg/dL — ABNORMAL HIGH (ref <1.0)

## 2021-08-16 LAB — URINE CULTURE

## 2021-08-16 LAB — HEMOGLOBIN AND HEMATOCRIT, BLOOD
HCT: 25.4 % — ABNORMAL LOW (ref 36.0–46.0)
HCT: 25.9 % — ABNORMAL LOW (ref 36.0–46.0)
Hemoglobin: 8.1 g/dL — ABNORMAL LOW (ref 12.0–15.0)
Hemoglobin: 8.2 g/dL — ABNORMAL LOW (ref 12.0–15.0)

## 2021-08-16 LAB — MAGNESIUM: Magnesium: 1.8 mg/dL (ref 1.7–2.4)

## 2021-08-16 MED ORDER — POLYETHYLENE GLYCOL 3350 17 G PO PACK
17.0000 g | PACK | Freq: Every day | ORAL | Status: DC
  Administered 2021-08-16 – 2021-08-18 (×2): 17 g via ORAL
  Filled 2021-08-16 (×2): qty 1

## 2021-08-16 MED ORDER — ONDANSETRON HCL 4 MG/2ML IJ SOLN
4.0000 mg | Freq: Four times a day (QID) | INTRAMUSCULAR | Status: DC | PRN
  Administered 2021-08-16: 4 mg via INTRAVENOUS
  Filled 2021-08-16: qty 2

## 2021-08-16 MED ORDER — MAGNESIUM SULFATE 2 GM/50ML IV SOLN
2.0000 g | Freq: Once | INTRAVENOUS | Status: AC
  Administered 2021-08-16: 2 g via INTRAVENOUS
  Filled 2021-08-16: qty 50

## 2021-08-16 MED ORDER — ALBUTEROL SULFATE HFA 108 (90 BASE) MCG/ACT IN AERS
2.0000 | INHALATION_SPRAY | Freq: Four times a day (QID) | RESPIRATORY_TRACT | Status: DC | PRN
  Filled 2021-08-16: qty 6.7

## 2021-08-16 MED ORDER — CALCIUM CARBONATE ANTACID 500 MG PO CHEW
1.0000 | CHEWABLE_TABLET | Freq: Two times a day (BID) | ORAL | Status: DC | PRN
  Administered 2021-08-16 – 2021-08-17 (×2): 200 mg via ORAL
  Filled 2021-08-16 (×2): qty 1

## 2021-08-16 NOTE — Progress Notes (Signed)
[place] at [place] NAME: Courtney Mack    MR#:  [id number]  DATE OF BIRTH:  [birth year]  SUBJECTIVE:   patient transferred from PCCM patient was admitted with generalized weakness shortness of breath. She was found to have covered positive without pneumonia. Denies any rectal bleeding. Found to have hemoglobin of 4.6 drop from 8.6 since 16th January.  Denies any complaints. Earlier had vomiting. No family at bedside. Tolerating clear liquid diet.   VITALS:  Blood pressure (!) 98/53, pulse 81, temperature 98.5 F (36.9 C), temperature source Oral, resp. rate (!) 21, height 4\' 10"  (1.473 m), weight 99.3 kg, SpO2 92 %.  PHYSICAL EXAMINATION:   GENERAL:  66 y.o.-year-old patient lying in the bed with no acute distress. Pallor+ LUNGS: Normal breath sounds bilaterally, no wheezing, rales, rhonchi.  CARDIOVASCULAR: S1, S2 normal. No murmurs, rubs, or gallops.  ABDOMEN: Soft, nontender, nondistended. Bowel sounds present.  EXTREMITIES: No  edema b/l.    NEUROLOGIC: nonfocal  patient is alert and awake SKIN: No obvious rash, lesion, or ulcer.   LABORATORY PANEL:  CBC Recent Labs  Lab 08/16/21 0502 08/16/21 1126  WBC 4.1  --   HGB 7.8* 8.2*  HCT 24.5* 25.9*  PLT 245  --     Chemistries  Recent Labs  Lab 08/16/21 0502  NA 136  K 3.5  CL 103  CO2 26  GLUCOSE 87  BUN 15  CREATININE 0.59  CALCIUM 8.0*  MG 1.8  AST 20  ALT 9  ALKPHOS 33*  BILITOT 1.5*   Cardiac Enzymes No results for input(s): TROPONINI in the last 168 hours. RADIOLOGY:  DG Chest 2 View  Result Date: 08/14/2021 CLINICAL DATA:  Weakness, shortness of breath EXAM: CHEST - 2 VIEW COMPARISON:  07/18/2021 FINDINGS: The heart size and mediastinal contours are within normal limits. Both lungs are clear. The visualized skeletal structures are unremarkable. IMPRESSION: No active cardiopulmonary disease. Electronically Signed   By: Rolm Baptise M.D.   On: 08/14/2021  20:20   CT HEAD WO CONTRAST (5MM)  Result Date: 08/14/2021 CLINICAL DATA:  Headache, new or worsening (Age >= 50y). Hypoglycemia. EXAM: CT HEAD WITHOUT CONTRAST TECHNIQUE: Contiguous axial images were obtained from the base of the skull through the vertex without intravenous contrast. RADIATION DOSE REDUCTION: This exam was performed according to the departmental dose-optimization program which includes automated exposure control, adjustment of the mA and/or kV according to patient size and/or use of iterative reconstruction technique. COMPARISON:  06/20/2021 FINDINGS: Brain: No acute intracranial abnormality. Specifically, no hemorrhage, hydrocephalus, mass lesion, acute infarction, or significant intracranial injury. Vascular: No hyperdense vessel or unexpected calcification. Skull: No acute calvarial abnormality. Sinuses/Orbits: No acute findings. Mucosal thickening throughout the ethmoid air cells. Other: None IMPRESSION: No acute intracranial abnormality. Electronically Signed   By: Rolm Baptise M.D.   On: 08/14/2021 20:09   NM GI Blood Loss  Result Date: 08/15/2021 CLINICAL DATA:  Recurrent GI bleeding with anemia despite transfusions. EXAM: NUCLEAR MEDICINE GASTROINTESTINAL BLEEDING SCAN TECHNIQUE: Sequential abdominal images were obtained following intravenous administration of Tc-80m labeled red blood cells. RADIOPHARMACEUTICALS:  20.0 mCi Tc-63m pertechnetate in-vitro labeled red cells. COMPARISON:  Abdominal CTA 08/14/2021. Nuclear medicine GI bleeding study 07/18/2021. FINDINGS: Anterior imaging was performed through 2 hours. No active gastrointestinal bleeding is identified. There is progressive activity within the perineum, well inferior to the bladder, similar to previous study and probably urine contamination. There is normal blood pool activity. Atypical configuration of  activity in the left upper quadrant of the abdomen is non moveable and unchanged from the previous bleeding study, likely  due to an overlap of cardiac and splenic activity. IMPRESSION: No evidence of active gastrointestinal bleeding. Electronically Signed   By: Richardean Sale M.D.   On: 08/15/2021 16:08   CT Angio Abd/Pel W and/or Wo Contrast  Result Date: 08/14/2021 CLINICAL DATA:  Lower GI bleed symptomatic anemia EXAM: CTA ABDOMEN AND PELVIS WITHOUT AND WITH CONTRAST TECHNIQUE: Multidetector CT imaging of the abdomen and pelvis was performed using the standard protocol during bolus administration of intravenous contrast. Multiplanar reconstructed images and MIPs were obtained and reviewed to evaluate the vascular anatomy. RADIATION DOSE REDUCTION: This exam was performed according to the departmental dose-optimization program which includes automated exposure control, adjustment of the mA and/or kV according to patient size and/or use of iterative reconstruction technique. CONTRAST:  165mL OMNIPAQUE IOHEXOL 350 MG/ML SOLN COMPARISON:  RBC scan 07/18/2021, CT 06/20/2021 FINDINGS: VASCULAR Aorta: Mild atherosclerosis. No aneurysm, dissection, stenosis or occlusion Celiac: Moderate calcification at the origin without high-grade stenosis. Distal vascular patency. No aneurysm. SMA: Origin calcification. Mild mural thickening at the origin of the SMA results in mild stenosis. No aneurysm. Renals: Single right and single left renal arteries with calcification at the origins. Suspect mild stenosis of the proximal right renal artery just distal to the origin. IMA: Mild to moderate stenosis at the origin but widely patent distally. Inflow: Mild atherosclerosis. No aneurysm dissection or occlusive disease. Proximal Outflow: Bilateral common femoral and visualized portions of the superficial and profunda femoral arteries are patent without evidence of aneurysm, dissection, vasculitis or significant stenosis. Veins: IVC filter at the L3 level. Diminutive appearing distal IVC and common iliac veins possibly due to chronic occlusion. Numerous  collateral vessels in the retroperitoneum and anterior abdominal wall. Review of the MIP images confirms the above findings. NON-VASCULAR Lower chest: 6 lung bases demonstrate no acute consolidation or effusion. Normal cardiac size. Trace pericardial effusion Hepatobiliary: No focal liver abnormality is seen. No gallstones, gallbladder wall thickening, or biliary dilatation. Pancreas: Unremarkable. No pancreatic ductal dilatation or surrounding inflammatory changes. Spleen: Normal in size without focal abnormality. Adrenals/Urinary Tract: Adrenal glands are normal. No hydronephrosis. Probable small parapelvic cysts. The bladder is nearly empty Stomach/Bowel: The stomach is nonenlarged. Radiodense material within the colon. No acute bowel wall thickening. Negative appendix. No intraluminal extravasation of contrast Lymphatic: No increasing lymph nodes. Borderline retroperitoneal lymph nodes without change. Reproductive: Uterus and bilateral adnexa are unremarkable. Other: Negative for pelvic effusion or free air. Generalized subcutaneous edema consistent with anasarca Musculoskeletal: No acute osseous abnormality. Severe degenerative changes of the bilateral hips. IMPRESSION: VASCULAR 1. Negative for active GI bleeding. 2. Atherosclerotic vascular disease without evidence for high-grade stenosis or acute occlusive disease. NON-VASCULAR 1. No CT evidence for acute intra-abdominal or pelvic abnormality. 2. Generalized subcutaneous edema consistent with anasarca. Electronically Signed   By: Donavan Foil M.D.   On: 08/14/2021 23:20    Assessment and Plan  66 y.o with significant PMH of DVT/PE on Xarelto, recurrent GI Bleed, HTN, HLD, DM, Iron Deficiency Anemia, obesity, Endometrial Cancer, dCHF, cellulitis of Left Lower Leg who presented to the ED with chief complaints of weakness, dizziness with near syncope, chest pain, shortness of breath and hypoglycemia. On EMS arrival, patient was found to be tachycardic and  hypoglycemic with blood sugar readings of 23  Symptomatic Anemia secondary to Acute on Chronic GI Bleed  Recent GI bleed with positive bleeding scan 07/18/2021  at level of splenic flexure s/p embolization with Vascular surgery history of chronic anticoagulation secondary to DVT and PE -- CT abdomen pelvis without any source of bleeding -- with hemoglobin of 4.7---3 unit blood transfusion-- 7.8-- 8.4 -- denies rectal bleeding, denies hematemesis -- G.I. consultation with Dr. Marius Ditch-- plans for EGD and colonoscopy tomorrow -- continue IV Protonix -- bleeding scan negative -- patient on PO iron pills at home  COVID infection Asymptomatic/no pneumonia -- started on Remdesivir by ICU NP will give total 3 days -- sats stable on room air -- PRN inhalers  Chronic HFpEF (last known EF 55%) Hypertension Hx: HLD  -- blood pressure remains soft -- hold BP meds (losartan) -- continue statins  Hx of Bilateral DVT/PE history of IVC filter -Hold Xarelto for now  Diabetes mellitus Now with episode of hypoglycemia  BG 23 on presentation --Hold home Metformin. Patient likely will not need at discharge given very stable sugars -- sugars very stable. -- A1c 6.0 -- will discontinue sliding scale -- check daily sugar in the morning   Procedures: Family communication : none Consults : G.I. CODE STATUS: full DVT Prophylaxis : SCD Level of care: Stepdown Status is: Inpatient Remains inpatient appropriate because: symptomatic anemia            TOTAL TIME TAKING CARE OF THIS PATIENT: 30 minutes.  >50% time spent on counselling and coordination of care  Note: This dictation was prepared with Dragon dictation along with smaller phrase technology. Any transcriptional errors that result from this process are unintentional.  Fritzi Mandes M.D    [facility]   CC: Primary care physician; [facility], [place]

## 2021-08-16 NOTE — Plan of Care (Signed)

## 2021-08-16 NOTE — TOC Initial Note (Signed)
Transition of Care [facility]) - Initial/Assessment Note    Patient Details  Name: Courtney Mack MRN: [id number] Date of Birth: October 05, 1955  Transition of Care [facility]) CM/SW Contact:    Shelbie Hutching, RN Phone Number: 08/16/2021, 4:32 PM  Clinical Narrative:            High Risk Readmission screen completed.         Patient admitted to the hospital with symptomatic anemia.   Patient also found to have COVID.  Patient is from home and lives with her daughter's.  Daughters provide transportation.  Patient all needed DME at home RW, bedside commode, and shower chair.  Patent is currently open with [facility] for PT but at discharge we can add nursing and OT.   TOC will cont to follow.    Expected Discharge Plan: [place] Barriers to Discharge: Continued Medical Work up   Patient Goals and CMS Choice        Expected Discharge Plan and Services Expected Discharge Plan: [place]   Discharge Planning Services: CM Consult Post Acute Care Choice: Home Health, Resumption of Svcs/PTA Provider Living arrangements for the past 2 months: Single Family Home                 DME Arranged: N/A DME Agency: NA       HH Arranged: RN, PT, OT HH Agency: [place] ([facility]) Date HH Agency Contacted: 08/16/21 Time HH Agency Contacted: 81 Representative spoke with at [place]: [place] Arrangements/Services Living arrangements for the past 2 months: [place] Lives with:: Adult Children Patient language and need for interpreter reviewed:: Yes Do you feel safe going back to the place where you live?: Yes      Need for Family Participation in Patient Care: Yes (Comment) Care giver support system in place?: Yes (comment) (daughters) Current home services: DME Criminal Activity/Legal Involvement Pertinent to Current Situation/Hospitalization: No - Comment as needed  Activities of Daily Living Home Assistive  Devices/Equipment: None ADL Screening (condition at time of admission) Patient's cognitive ability adequate to safely complete daily activities?: Yes Is the patient deaf or have difficulty hearing?: No Does the patient have difficulty seeing, even when wearing glasses/contacts?: No Does the patient have difficulty concentrating, remembering, or making decisions?: No Patient able to express need for assistance with ADLs?: No Does the patient have difficulty dressing or bathing?: Yes Independently performs ADLs?: Yes (appropriate for developmental age) Does the patient have difficulty walking or climbing stairs?: Yes Weakness of Legs: Both Weakness of Arms/Hands: None  Permission Sought/Granted Permission sought to share information with : Case Manager, Family Supports, Other (comment) Permission granted to share information with : Yes, Verbal Permission Granted  Share Information with NAME: Eula Flax  Permission granted to share info w AGENCY: [facility]  Permission granted to share info w Relationship: daughter  Permission granted to share info w Contact Information: [phone]  Emotional Assessment Appearance:: Appears stated age     Orientation: : Oriented to Self, Oriented to Place, Oriented to  Time, Oriented to Situation Alcohol / Substance Use: Not Applicable Psych Involvement: No (comment)  Admission diagnosis:  Symptomatic anemia [D64.9] Gastrointestinal hemorrhage, unspecified gastrointestinal hemorrhage type [K92.2] COVID-19 [U07.1] Patient Active Problem List   Diagnosis Date Noted   COVID-19    Melena 07/18/2021   Acute on chronic blood loss anemia 07/18/2021   Pressure injury of skin 06/21/2021   Symptomatic anemia 06/20/2021  Sepsis ([place]) 06/20/2021   HLD (hyperlipidemia) 06/20/2021   Fall at home, initial encounter 06/20/2021   Cough 06/20/2021   Chronic diastolic CHF (congestive heart failure) ([place]) 06/20/2021   GI bleeding 06/20/2021   Cellulitis of  left lower leg 06/20/2021   Acute bilateral deep vein thrombosis (DVT) of femoral veins ([place]) 01/02/2021   DOE (dyspnea on exertion) 11/10/2020   Risk factors for obstructive sleep apnea 11/10/2020   Uterine cancer ([place]) 11/08/2020   IDA (iron deficiency anemia) 09/21/2020   Endometrial cancer ([place]) 09/13/2020   Acute blood loss anemia 09/06/2020   Vaginal bleeding 09/06/2020   DVT (deep venous thrombosis) ([place]) 09/06/2020   Acute massive pulmonary embolism ([place]) 08/25/2020   Anemia, unspecified 08/24/2020   Morbidly obese ([place]) 08/23/2020   Essential hypertension 08/23/2020   Diabetes mellitus without complication ([place]) 23/76/2831   Dyslipidemia 08/23/2020   Acute respiratory failure with hypoxia ([place])    Pulmonary embolism ([place]) 08/18/2020   Vaginal bleeding, abnormal 08/18/2020   Obesity, morbid, BMI 50 or higher ([place]) 08/18/2020   PCP:  [place], [place]:   [id number] [address], [address] [place] [address] Phone: [phone] Fax: [phone]     Social Determinants of Health (SDOH) Interventions    Readmission Risk Interventions Readmission Risk Prevention Plan 08/16/2021 07/19/2021 06/23/2021  Transportation Screening Complete Complete Complete  PCP or Specialist Appt within 3-5 Days - - Complete  HRI or [place] - Complete -  Social Work Consult for [place] Planning/Counseling - Complete Complete  Palliative Care Screening - Not Applicable Not Applicable  Medication Review Press photographer) Complete Complete Complete  PCP or Specialist appointment within 3-5 days of discharge Complete - -  [place] or Home Care Consult Complete - -  SW Recovery Care/Counseling Consult Complete - -  Palliative Care Screening Not Applicable - -  [place] Not Applicable - -  Some recent data might be hidden

## 2021-08-16 NOTE — Consult Note (Signed)
PHARMACY CONSULT NOTE  Pharmacy Consult for Electrolyte Monitoring and Replacement   Recent Labs: Potassium (mmol/L)  Date Value  08/16/2021 3.5   Magnesium (mg/dL)  Date Value  08/16/2021 1.8   Calcium (mg/dL)  Date Value  08/16/2021 8.0 (L)   Albumin (g/dL)  Date Value  08/16/2021 2.7 (L)   Phosphorus (mg/dL)  Date Value  08/16/2021 3.4   Sodium (mmol/L)  Date Value  08/16/2021 136    Assessment: Patient is a 66 y/o F with medical history including DVT/PE on Xarelto, recurrent GIB, HTN, HLD, DM, IDA, obesity, diastolic CHF, endometrial cancer who is admitted with symptomatic anemia secondary to acute on chronic GIB, gram negative bacteremia, and asymptomatic COVID-19 infection. Pharmacy consulted to assist with electrolyte monitoring and replacement as indicated.  Goal of Therapy:  Electrolytes within normal limits  Plan:  --Hypomagnesemia improved and now at LLN of 1.8. Will give another dose of IV magnesium sulfate 2 g x 1 today --Follow-up electrolytes with AM labs tomorrow  Courtney Mack 08/16/2021 9:21 AM

## 2021-08-17 ENCOUNTER — Inpatient Hospital Stay: Payer: Medicare (Managed Care) | Admitting: Certified Registered"

## 2021-08-17 ENCOUNTER — Encounter
Admission: EM | Disposition: A | Discharge status: Home Health | Payer: Self-pay | Source: Home / Self Care | Attending: Internal Medicine

## 2021-08-17 ENCOUNTER — Encounter: Payer: Self-pay | Admitting: Pulmonary Disease

## 2021-08-17 ENCOUNTER — Ambulatory Visit (INDEPENDENT_AMBULATORY_CARE_PROVIDER_SITE_OTHER): Payer: Medicare (Managed Care) | Admitting: Nurse Practitioner

## 2021-08-17 DIAGNOSIS — K31819 Angiodysplasia of stomach and duodenum without bleeding: Secondary | ICD-10-CM

## 2021-08-17 DIAGNOSIS — K921 Melena: Secondary | ICD-10-CM

## 2021-08-17 DIAGNOSIS — K2940 Chronic atrophic gastritis without bleeding: Secondary | ICD-10-CM

## 2021-08-17 HISTORY — PX: ENTEROSCOPY: SHX5533

## 2021-08-17 HISTORY — PX: ESOPHAGOGASTRODUODENOSCOPY (EGD) WITH PROPOFOL: SHX5813

## 2021-08-17 LAB — BLOOD CULTURE ID PANEL (REFLEXED) - BCID2

## 2021-08-17 LAB — HEMOGLOBIN AND HEMATOCRIT, BLOOD
HCT: 28.1 % — ABNORMAL LOW (ref 36.0–46.0)
HCT: 31.4 % — ABNORMAL LOW (ref 36.0–46.0)
Hemoglobin: 8.7 g/dL — ABNORMAL LOW (ref 12.0–15.0)
Hemoglobin: 9.5 g/dL — ABNORMAL LOW (ref 12.0–15.0)

## 2021-08-17 LAB — GLUCOSE, CAPILLARY
Glucose-Capillary: 94 mg/dL (ref 70–99)
Glucose-Capillary: 97 mg/dL (ref 70–99)

## 2021-08-17 LAB — PHOSPHORUS: Phosphorus: 3.7 mg/dL (ref 2.5–4.6)

## 2021-08-17 SURGERY — ESOPHAGOGASTRODUODENOSCOPY (EGD) WITH PROPOFOL
Anesthesia: General

## 2021-08-17 MED ORDER — IPRATROPIUM-ALBUTEROL 0.5-2.5 (3) MG/3ML IN SOLN
3.0000 mL | Freq: Once | RESPIRATORY_TRACT | Status: AC
  Administered 2021-08-17: 3 mL via RESPIRATORY_TRACT

## 2021-08-17 MED ORDER — PROPOFOL 500 MG/50ML IV EMUL
INTRAVENOUS | Status: DC | PRN
  Administered 2021-08-17: 150 ug/kg/min via INTRAVENOUS

## 2021-08-17 MED ORDER — IPRATROPIUM-ALBUTEROL 0.5-2.5 (3) MG/3ML IN SOLN
RESPIRATORY_TRACT | Status: AC
  Filled 2021-08-17: qty 3

## 2021-08-17 MED ORDER — PROPOFOL 10 MG/ML IV BOLUS
INTRAVENOUS | Status: DC | PRN
  Administered 2021-08-17: 70 mg via INTRAVENOUS
  Administered 2021-08-17: 10 mg via INTRAVENOUS

## 2021-08-17 MED ORDER — SODIUM CHLORIDE 0.9 % IV SOLN: INTRAVENOUS | Status: DC

## 2021-08-17 MED ORDER — LIDOCAINE HCL (CARDIAC) PF 100 MG/5ML IV SOSY
PREFILLED_SYRINGE | INTRAVENOUS | Status: DC | PRN
  Administered 2021-08-17: 40 mg via INTRAVENOUS

## 2021-08-17 NOTE — Transfer of Care (Signed)
Immediate Anesthesia Transfer of Care Note  Patient: DAJA SHUPING  Procedure(s) Performed: ESOPHAGOGASTRODUODENOSCOPY (EGD) WITH PROPOFOL ENTEROSCOPY  Patient Location: PACU and Endoscopy Unit  Anesthesia Type:General  Level of Consciousness: drowsy  Airway & Oxygen Therapy: Patient Spontanous Breathing and Patient connected to face mask oxygen  Post-op Assessment: Report given to RN and Post -op Vital signs reviewed and stable  Post vital signs: Reviewed and stable  Last Vitals:  Vitals Value Taken Time  BP 129/64 08/17/21 1407  Temp    Pulse 90 08/17/21 1407  Resp 20   SpO2 100 % 08/17/21 1407    Last Pain:  Vitals:   08/17/21 1407  TempSrc:   PainSc: Asleep      Patients Stated Pain Goal: 0 (18/34/37 3578)  Complications: No notable events documented.

## 2021-08-17 NOTE — Anesthesia Procedure Notes (Signed)
Date/Time: 08/17/2021 1:47 PM Performed by: Doreen Salvage, CRNA Pre-anesthesia Checklist: Patient identified, Emergency Drugs available, Suction available and Patient being monitored Patient Re-evaluated:Patient Re-evaluated prior to induction Oxygen Delivery Method: Simple face mask Induction Type: IV induction Dental Injury: Teeth and Oropharynx as per pre-operative assessment  Comments: Nasal cannula with etCO2 monitoring

## 2021-08-17 NOTE — Op Note (Addendum)
[facility] Gastroenterology Patient Name: Courtney Mack Procedure Date: 08/17/2021 1:21 PM MRN: [id number] Account #: [id number] Date of Birth: [birth year] Age: 66 Gender: Female THIS EXAM WAS SENT IN ERROR

## 2021-08-17 NOTE — Op Note (Signed)
[facility] Gastroenterology Patient Name: Courtney Mack Procedure Date: 08/17/2021 1:21 PM MRN: [id number] Account #: [id number] Date of Birth: Dec 09, 1955 Admit Type: Outpatient Age: 66 Room: [facility] ENDO ROOM 2 Gender: Female Note Status: Finalized Instrument Name: Peds Colonoscope [id number] Procedure:             Small bowel enteroscopy Indications:           Anemia, Obscure gastrointestinal bleeding Providers:             Lin Landsman MD, MD Medicines:             General Anesthesia Complications:         No immediate complications. Estimated blood loss: None. Procedure:             Pre-Anesthesia Assessment:                        - Prior to the procedure, a History and Physical was                         performed, and patient medications and allergies were                         reviewed. The patient is competent. The risks and                         benefits of the procedure and the sedation options and                         risks were discussed with the patient. All questions                         were answered and informed consent was obtained.                         Patient identification and proposed procedure were                         verified by the physician, the nurse, the                         anesthesiologist, the anesthetist and the technician                         in the pre-procedure area in the procedure room in the                         endoscopy suite. Mental Status Examination: alert and                         oriented. Airway Examination: normal oropharyngeal                         airway and neck mobility. Respiratory Examination:                         clear to auscultation. CV Examination: normal.                         Prophylactic  Antibiotics: The patient does not require                         prophylactic antibiotics. Prior Anticoagulants: The                         patient has taken Eliquis (apixaban),  last dose was 3                         days prior to procedure. ASA Grade Assessment: III - A                         patient with severe systemic disease. After reviewing                         the risks and benefits, the patient was deemed in                         satisfactory condition to undergo the procedure. The                         anesthesia plan was to use general anesthesia.                         Immediately prior to administration of medications,                         the patient was re-assessed for adequacy to receive                         sedatives. The heart rate, respiratory rate, oxygen                         saturations, blood pressure, adequacy of pulmonary                         ventilation, and response to care were monitored                         throughout the procedure. The physical status of the                         patient was re-assessed after the procedure.                        After obtaining informed consent, the endoscope was                         passed under direct vision. Throughout the procedure,                         the patient's blood pressure, pulse, and oxygen                         saturations were monitored continuously. The                         Colonoscope was introduced through the mouth and  advanced to the distal jejunum. The small bowel                         enteroscopy was accomplished without difficulty. The                         patient tolerated the procedure well. Findings:      There was no evidence of significant pathology in the entire examined       portion of jejunum.      The examined duodenum was normal.      The esophagus was normal.      Moderate, diffuse gastric antral vascular ectasia without bleeding was       present in the prepyloric region of the stomach. Coagulation for       hemostasis using argon plasma was successful. Estimated blood loss: none.      Diffuse  atrophic mucosa was found in the entire examined stomach.       Biopsies were taken with a cold forceps for histology. Estimated blood       loss: none.      The gastroesophageal junction and examined esophagus were normal. Impression:            - The examined portion of the jejunum was normal.                        - Normal examined duodenum.                        - Normal esophagus.                        - Gastric antral vascular ectasia without bleeding.                         Treated with argon plasma coagulation (APC).                        - Gastric mucosal atrophy. Biopsied.                        - Normal gastroesophageal junction and esophagus. Recommendation:        - Return patient to hospital ward for ongoing care.                        - Advance diet as tolerated today.                        - Await pathology results.                        - Resume Eliquis (apixaban) in 3 days at prior dose.                        - Refer to a hematologist at appointment to be                         scheduled.                        - Return to GI clinic in 1 month.                        -  Use a proton pump inhibitor PO BID. Procedure Code(s):     --- Professional ---                        [phone], 56, Small intestinal endoscopy, enteroscopy                         beyond second portion of duodenum, not including                         ileum; with control of bleeding (eg, injection,                         bipolar cautery, unipolar cautery, laser, heater                         probe, stapler, plasma coagulator)                        44361, Small intestinal endoscopy, enteroscopy beyond                         second portion of duodenum, not including ileum; with                         biopsy, single or multiple Diagnosis Code(s):     --- Professional ---                        C62.376, Angiodysplasia of stomach and duodenum                         without bleeding                         K31.89, Other diseases of stomach and duodenum                        D64.9, Anemia, unspecified                        K92.2, Gastrointestinal hemorrhage, unspecified CPT copyright 2019 American Medical Association. All rights reserved. The codes documented in this report are preliminary and upon coder review may  be revised to meet current compliance requirements. Dr. Ulyess Mort Lin Landsman MD, MD 08/17/2021 2:37:50 PM This report has been signed electronically. Number of Addenda: 0 Note Initiated On: 08/17/2021 1:21 PM Estimated Blood Loss:  Estimated blood loss: none.      [facility]

## 2021-08-17 NOTE — Progress Notes (Signed)
PHARMACY - PHYSICIAN COMMUNICATION CRITICAL VALUE ALERT - BLOOD CULTURE IDENTIFICATION (BCID)  Prior BCID results:  1 anaerobic bottle of 4 with GNRs - ID and Susceptibilities still in progress.  Updated BCID results:  1 aerobic bottle (same set) of 4 now with Staph Epi, mecA/C detected.  MRSA by PCR was detected on 2/8.  However pt has been afebrile for at least past 24 hrs and WBC remain WNL as of early yesterday x 3 days. Pt current on Zosyn (and Remdesivir - COVID +).  Could be possible contaminant.  Add Vanc to cover for possible Staph Epi?  Name of provider contacted:  Rachael Fee, NP  Changes to prescribed antibiotics requested: No changes, continue to monitor.  Renda Rolls, PharmD, [facility] 08/17/2021 2:33 AM

## 2021-08-17 NOTE — Evaluation (Signed)
Occupational Therapy Evaluation Patient Details Name: Courtney Mack MRN: [id number] DOB: 1956/06/18 Today's Date: 08/17/2021   History of Present Illness Pt is a 66 y/o F admitted on 08/14/21 after presenting with c/c of weakness, dizziness with near syncope, chest pain, SOB & hypoglycemia. Pt is being treated for symptomatic anemia 2/2 acute on chronic GI bleed. Pt also found to be COVID (+). PMH: DVT/PE on xarelto, recurrent GI bleed, HTN, HLD, DM, iron deficiency anemia, obesity, endometrial CA, dCHF, LLE cellulitis   Clinical Impression   Ms Pointer was seen for OT evaluation this date. Prior to hospital admission, pt was MOD I for mobility using RW, assist for bathing/dressing from family. Pt lives with family. Pt presents to acute OT demonstrating impaired ADL performance and functional mobility 2/2 decreased activity tolerance and functional strength/balance deficits. Pt currently requires MOD A don B socks seated EOC - pt reports baseline. SBA for toilet t/f and grooming standing sinkside. MOD A sit>sup 2/2 elevated bed height. Pt would benefit from skilled OT to address noted impairments and functional limitations (see below for any additional details). Upon hospital discharge, recommend HHOT to maximize pt safety and return to PLOF.       Recommendations for follow up therapy are one component of a multi-disciplinary discharge planning process, led by the attending physician.  Recommendations may be updated based on patient status, additional functional criteria and insurance authorization.   Follow Up Recommendations  Home health OT    Assistance Recommended at Discharge Frequent or constant Supervision/Assistance  Patient can return home with the following A little help with walking and/or transfers;A little help with bathing/dressing/bathroom;Help with stairs or ramp for entrance    Functional Status Assessment  Patient has had a recent decline in their functional status and  demonstrates the ability to make significant improvements in function in a reasonable and predictable amount of time.  Equipment Recommendations  BSC/3in1    Recommendations for Other Services       Precautions / Restrictions Precautions Precautions: Fall Restrictions Weight Bearing Restrictions: No      Mobility Bed Mobility Overal bed mobility: Needs Assistance Bed Mobility: Sit to Supine       Sit to supine: Mod assist   General bed mobility comments: assist with BLE to high bed    Transfers Overall transfer level: Needs assistance Equipment used: Rolling walker (2 wheels) Transfers: Sit to/from Stand, Bed to chair/wheelchair/BSC Sit to Stand: Supervision     Step pivot transfers: Supervision            Balance Overall balance assessment: Needs assistance Sitting-balance support: No upper extremity supported, Feet supported Sitting balance-Leahy Scale: Good     Standing balance support: Single extremity supported, During functional activity Standing balance-Leahy Scale: Fair                             ADL either performed or assessed with clinical judgement   ADL Overall ADL's : Needs assistance/impaired                                       General ADL Comments: MOD A don B socks seated EOB - pt reports baseline. SBA for toilet t/f and grooming standing sinkside.      Pertinent Vitals/Pain Pain Assessment Pain Assessment: No/denies pain     Hand Dominance Right   Extremity/Trunk  Assessment Upper Extremity Assessment Upper Extremity Assessment: Overall WFL for tasks assessed   Lower Extremity Assessment Lower Extremity Assessment: Generalized weakness       Communication Communication Communication: No difficulties   Cognition Arousal/Alertness: Awake/alert Behavior During Therapy: WFL for tasks assessed/performed Overall Cognitive Status: Within Functional Limits for tasks assessed                                                   Home Living Family/patient expects to be discharged to:: Private residence Living Arrangements: Children Available Help at Discharge: Family;Available 24 hours/day Type of Home: House Home Access: Ramped entrance     Home Layout: One level     Bathroom Shower/Tub: Occupational psychologist: Handicapped height     Home Equipment: Rollator (4 wheels)   Additional Comments: has Risk analyst      Prior Functioning/Environment Prior Level of Function : Independent/Modified Independent             Mobility Comments: Pt reports mod I with rollator at home. Endorses 1 recent fall 2/2 low hgb          OT Problem List: Decreased strength;Decreased activity tolerance;Decreased range of motion      OT Treatment/Interventions: Self-care/ADL training;Therapeutic exercise;DME and/or AE instruction;Therapeutic activities;Patient/family education;Balance training    OT Goals(Current goals can be found in the care plan section) Acute Rehab OT Goals Patient Stated Goal: to go home OT Goal Formulation: With patient Time For Goal Achievement: 08/31/21 Potential to Achieve Goals: Good ADL Goals Pt Will Perform Grooming: with modified independence;standing Pt Will Perform Lower Body Dressing: with modified independence;sit to/from stand;with caregiver independent in assisting Pt Will Transfer to Toilet: with modified independence;ambulating;regular height toilet Pt Will Perform Toileting - Clothing Manipulation and hygiene: with modified independence;sitting/lateral leans  OT Frequency: Min 2X/week       AM-PAC OT "6 Clicks" Daily Activity     Outcome Measure Help from another person eating meals?: None Help from another person taking care of personal grooming?: A Little Help from another person toileting, which includes using toliet, bedpan, or urinal?: A Little Help from another person bathing (including washing, rinsing,  drying)?: A Little Help from another person to put on and taking off regular upper body clothing?: A Little Help from another person to put on and taking off regular lower body clothing?: A Lot 6 Click Score: 18   End of Session Equipment Utilized During Treatment: Rolling walker (2 wheels)  Activity Tolerance: Patient tolerated treatment well Patient left: in bed;with call bell/phone within reach;with bed alarm set  OT Visit Diagnosis: Other abnormalities of gait and mobility (R26.89);Muscle weakness (generalized) (M62.81)                Time: 2993-7169 OT Time Calculation (min): 14 min Charges:  OT General Charges $OT Visit: 1 Visit OT Evaluation $OT Eval Low Complexity: 1 Low  Dessie Coma, M.S. OTR/L  08/17/21, 1:49 PM  ascom [phone]

## 2021-08-17 NOTE — Progress Notes (Signed)
[facility] NAME: Courtney Mack    MR#:  [id number]  DATE OF BIRTH:  Jan 14, 1956  SUBJECTIVE:   patient was seen after EGD/small bowel endoscopy. Found to have GAVE lesions in underwent argon laser therapy. Hemoglobin stable. Patient has some cough post procedure  VITALS:  Blood pressure (!) 141/71, pulse 90, temperature 98.4 F (36.9 C), temperature source Oral, resp. rate (!) 22, height 4\' 10"  (1.473 m), weight 99.3 kg, SpO2 97 %.  PHYSICAL EXAMINATION:   GENERAL:  66 y.o.-year-old patient lying in the bed with no acute distress. Pallor+ LUNGS: Normal breath sounds bilaterally, no wheezing, rales, rhonchi.  CARDIOVASCULAR: S1, S2 normal. No murmurs, rubs, or gallops.  ABDOMEN: Soft, nontender, nondistended. Bowel sounds present.  EXTREMITIES: No  edema b/l.    NEUROLOGIC: nonfocal  patient is alert and awake SKIN: No obvious rash, lesion, or ulcer.   LABORATORY PANEL:  CBC Recent Labs  Lab 08/16/21 0502 08/16/21 1126 08/17/21 0053  WBC 4.1  --   --   HGB 7.8*   < > 8.7*  HCT 24.5*   < > 28.1*  PLT 245  --   --    < > = values in this interval not displayed.     Chemistries  Recent Labs  Lab 08/16/21 0502  NA 136  K 3.5  CL 103  CO2 26  GLUCOSE 87  BUN 15  CREATININE 0.59  CALCIUM 8.0*  MG 1.8  AST 20  ALT 9  ALKPHOS 33*  BILITOT 1.5*    Cardiac Enzymes No results for input(s): TROPONINI in the last 168 hours. RADIOLOGY:  No results found.  Assessment and Plan  66 y.o with significant PMH of DVT/PE on Xarelto, recurrent GI Bleed, HTN, HLD, DM, Iron Deficiency Anemia, obesity, Endometrial Cancer, dCHF, cellulitis of Left Lower Leg who presented to the ED with chief complaints of weakness, dizziness with near syncope, chest pain, shortness of breath and hypoglycemia. On EMS arrival, patient was found to be tachycardic and hypoglycemic with blood sugar readings of 23  Symptomatic Anemia secondary  to Acute on Chronic GI Bleed  Recent GI bleed with positive bleeding scan 07/18/2021 at level of splenic flexure s/p embolization with Vascular surgery history of chronic anticoagulation secondary to DVT and PE -- CT abdomen pelvis without any source of bleeding -- with hemoglobin of 4.7---3 unit blood transfusion-- 7.8-- 8.4 -- denies rectal bleeding, denies hematemesis -- G.I. consultation with Dr. Marius Ditch-- plans for EGD and colonoscopy tomorrow -- continue IV Protonix -- bleeding scan negative -- patient on PO iron pills at home --2/10--EGD  Normal examined duodenum.                        - Normal esophagus.                        - Gastric antral vascular ectasia without bleeding.                         Treated with argon plasma coagulation (APC).                        - Gastric mucosal atrophy. Biopsied.                        - Normal gastroesophageal junction and esophagus --  resume diet per G.I. Okay to resume eliquis after three days per Dr. Marius Ditch.  COVID infection Asymptomatic/no pneumonia -- started on Remdesivir by ICU NP will give total 3 days -- sats stable on room air -- PRN inhalers  Chronic HFpEF (last known EF 55%) Hypertension Hx: HLD  -- blood pressure remains soft -- hold BP meds (losartan) -- continue statins  Hx of Bilateral DVT/PE history of IVC filter -Hold Xarelto for now-- per G.I. okay to resume after three days  Diabetes mellitus Now with episode of hypoglycemia  BG 23 on presentation --Hold home Metformin. Patient likely will not need at discharge given very stable sugars -- sugars very stable. -- A1c 6.0 -- will discontinue sliding scale -- check daily sugar in the morning   Procedures: EGD Family communication : daughter Graciella Belton Consults : G.I. CODE STATUS: full DVT Prophylaxis : SCD Level of care: Med-Surg Status is: Inpatient Remains inpatient appropriate because: symptomatic anemia    anticipate discharge tomorrow if remains  stable        TOTAL TIME TAKING CARE OF THIS PATIENT: 30 minutes.  >50% time spent on counselling and coordination of care  Note: This dictation was prepared with Dragon dictation along with smaller phrase technology. Any transcriptional errors that result from this process are unintentional.  Fritzi Mandes M.D    [facility]   CC: Primary care physician; [facility]

## 2021-08-17 NOTE — Anesthesia Preprocedure Evaluation (Addendum)
Anesthesia Evaluation  Patient identified by MRN, date of birth, ID band Patient awake    Reviewed: Allergy & Precautions, NPO status , Patient's Chart, lab work & pertinent test results  History of Anesthesia Complications Negative for: history of anesthetic complications  Airway Mallampati: III   Neck ROM: Full    Dental   Missing almost all teeth:   Pulmonary former smoker (quit 1982),  COVID+ 08/14/21, cough and congestion   Pulmonary exam normal breath sounds clear to auscultation       Cardiovascular hypertension, +CHF  Normal cardiovascular exam Rhythm:Regular Rate:Normal  DVT/PE 08/2020 s/p thrombectomy and IVC placement, on Xarelto  ECG 08/14/21:  Sinus rhythm Low voltage, precordial leads   Neuro/Psych negative neurological ROS     GI/Hepatic GERD  ,Acute on chronic GI bleed   Endo/Other  diabetes, Type 2Class 3 obesity  Renal/GU negative Renal ROS     Musculoskeletal   Abdominal   Peds  Hematology  (+) Blood dyscrasia, anemia ,   Anesthesia Other Findings   Reproductive/Obstetrics                            Anesthesia Physical Anesthesia Plan  ASA: 4  Anesthesia Plan: General   Post-op Pain Management:    Induction: Intravenous  PONV Risk Score and Plan: 3 and Propofol infusion, TIVA and Treatment may vary due to age or medical condition  Airway Management Planned: Natural Airway  Additional Equipment:   Intra-op Plan:   Post-operative Plan:   Informed Consent: I have reviewed the patients History and Physical, chart, labs and discussed the procedure including the risks, benefits and alternatives for the proposed anesthesia with the patient or authorized representative who has indicated his/her understanding and acceptance.       Plan Discussed with: CRNA  Anesthesia Plan Comments: (LMA/GETA backup discussed.  Patient consented for risks of anesthesia  including but not limited to:  - adverse reactions to medications - damage to eyes, teeth, lips or other oral mucosa - nerve damage due to positioning  - sore throat or hoarseness - damage to heart, brain, nerves, lungs, other parts of body or loss of life  Informed patient about role of CRNA in peri- and intra-operative care.  Patient voiced understanding.)        Anesthesia Quick Evaluation

## 2021-08-17 NOTE — Progress Notes (Signed)
Notified daughter that patient is moving to room 113.

## 2021-08-17 NOTE — Evaluation (Signed)
Physical Therapy Evaluation Patient Details Name: Courtney Mack MRN: [id number] DOB: 1956/03/11 Today's Date: 08/17/2021  History of Present Illness  Pt is a 66 y/o F admitted on 08/14/21 after presenting with c/c of weakness, dizziness with near syncope, chest pain, SOB & hypoglycemia. Pt is being treated for symptomatic anemia 2/2 acute on chronic GI bleed. Pt also found to be COVID (+). PMH: DVT/PE on xarelto, recurrent GI bleed, HTN, HLD, DM, iron deficiency anemia, obesity, endometrial CA, dCHF, LLE cellulitis  Clinical Impression  Pt seen for PT evaluation with pt agreeable. Pt reports prior to admission she was mod I with rollator. On this date, pt is mod I for bed mobility with use of hospital bed features, supervision for transfers, & supervision for gait in room with RW. Pt ambulates with decreased gait speed overall but reports this is baseline. Will continue to follow pt acutely to address balance, endurance & safety with mobility.     Recommendations for follow up therapy are one component of a multi-disciplinary discharge planning process, led by the attending physician.  Recommendations may be updated based on patient status, additional functional criteria and insurance authorization.  Follow Up Recommendations Home health PT    Assistance Recommended at Discharge Intermittent Supervision/Assistance  Patient can return home with the following  A little help with walking and/or transfers;A little help with bathing/dressing/bathroom;Assistance with cooking/housework;Direct supervision/assist for financial management;Assist for transportation;Help with stairs or ramp for entrance    Equipment Recommendations None recommended by PT  Recommendations for Other Services       Functional Status Assessment  (minimal decline)     Precautions / Restrictions Precautions Precautions: Fall Restrictions Weight Bearing Restrictions: No      Mobility  Bed Mobility Overal bed  mobility: Modified Independent             General bed mobility comments: supine>sit with use of bed rails, HOB elevated    Transfers Overall transfer level: Needs assistance Equipment used: Rolling walker (2 wheels) Transfers: Sit to/from Stand, Bed to chair/wheelchair/BSC Sit to Stand: Supervision   Step pivot transfers: Supervision       General transfer comment: Poor awareness requiring cuing for safe hand placement during sit<>stand    Ambulation/Gait Ambulation/Gait assistance: Supervision Gait Distance (Feet): 35 Feet Assistive device: Rolling walker (2 wheels) Gait Pattern/deviations: Trunk flexed, Decreased dorsiflexion - right, Decreased dorsiflexion - left, Decreased step length - left, Decreased step length - right, Decreased stride length Gait velocity: decreased     General Gait Details: decreased heel strike BLE, pushes RW slightly out in front of her  Stairs            Wheelchair Mobility    Modified Rankin (Stroke Patients Only)       Balance Overall balance assessment: Needs assistance Sitting-balance support: Bilateral upper extremity supported, Feet unsupported Sitting balance-Leahy Scale: Good     Standing balance support: Bilateral upper extremity supported, During functional activity Standing balance-Leahy Scale: Fair                               Pertinent Vitals/Pain Pain Assessment Pain Assessment: Faces Faces Pain Scale: Hurts a little bit Pain Location: LLQ abdomen Pain Descriptors / Indicators: Discomfort Pain Intervention(s): Monitored during session    Home Living Family/patient expects to be discharged to:: Private residence Living Arrangements: Children (grandchildren) Available Help at Discharge: Family;Available 24 hours/day Type of Home: House Home Access: Ramped entrance  Home Layout: One level Home Equipment: Rollator (4 wheels)      Prior Function Prior Level of Function :  Independent/Modified Independent             Mobility Comments: Pt reports mod I with rollator at home. Endorses 1 recent fall 2/2 low hgb       Hand Dominance        Extremity/Trunk Assessment   Upper Extremity Assessment Upper Extremity Assessment: Overall WFL for tasks assessed    Lower Extremity Assessment Lower Extremity Assessment: Generalized weakness       Communication   Communication: No difficulties  Cognition Arousal/Alertness: Awake/alert Behavior During Therapy: WFL for tasks assessed/performed Overall Cognitive Status: Within Functional Limits for tasks assessed                                          General Comments General comments (skin integrity, edema, etc.): SpO2 reading as low as 76% during gait (pt on room air throughout session) but poor pleth waveform & immediately recovers to WNL once sitting, pt denies SOB    Exercises     Assessment/Plan    PT Assessment Patient needs continued PT services  PT Problem List Decreased strength;Decreased range of motion;Decreased mobility;Decreased safety awareness;Decreased activity tolerance;Cardiopulmonary status limiting activity;Decreased knowledge of use of DME;Decreased balance       PT Treatment Interventions DME instruction;Gait training;Balance training;Therapeutic exercise;Functional mobility training;Therapeutic activities;Patient/family education;Neuromuscular re-education    PT Goals (Current goals can be found in the Care Plan section)  Acute Rehab PT Goals Patient Stated Goal: none stated PT Goal Formulation: With patient Time For Goal Achievement: 08/31/21 Potential to Achieve Goals: Good    Frequency Min 2X/week     Co-evaluation               AM-PAC PT "6 Clicks" Mobility  Outcome Measure Help needed turning from your back to your side while in a flat bed without using bedrails?: None Help needed moving from lying on your back to sitting on the side  of a flat bed without using bedrails?: A Little Help needed moving to and from a bed to a chair (including a wheelchair)?: A Little Help needed standing up from a chair using your arms (e.g., wheelchair or bedside chair)?: A Little Help needed to walk in hospital room?: A Little Help needed climbing 3-5 steps with a railing? : A Lot 6 Click Score: 18    End of Session   Activity Tolerance: Patient tolerated treatment well;Patient limited by fatigue Patient left: in chair;with call bell/phone within reach (OT entering room) Nurse Communication: Mobility status PT Visit Diagnosis: Unsteadiness on feet (R26.81);Muscle weakness (generalized) (M62.81)    Time: 2536-6440 PT Time Calculation (min) (ACUTE ONLY): 14 min   Charges:   PT Evaluation $PT Eval Low Complexity: 1 Low          .Lavone Nian, PT, DPT 08/17/21, 10:42 AM  Waunita Schooner 08/17/2021, 10:41 AM

## 2021-08-18 LAB — HEMOGLOBIN AND HEMATOCRIT, BLOOD
HCT: 29.7 % — ABNORMAL LOW (ref 36.0–46.0)
Hemoglobin: 9.2 g/dL — ABNORMAL LOW (ref 12.0–15.0)

## 2021-08-18 LAB — PHOSPHORUS: Phosphorus: 3.5 mg/dL (ref 2.5–4.6)

## 2021-08-18 MED ORDER — RIVAROXABAN 20 MG PO TABS: 20.0000 mg | ORAL_TABLET | Freq: Every day | ORAL | 1 refills | Status: DC

## 2021-08-18 MED ORDER — CYANOCOBALAMIN 1000 MCG PO TABS: 1000.0000 ug | ORAL_TABLET | Freq: Every day | ORAL | 0 refills | Status: DC

## 2021-08-18 MED ORDER — LOSARTAN POTASSIUM 25 MG PO TABS: 25.0000 mg | ORAL_TABLET | Freq: Every day | ORAL | 0 refills | Status: DC

## 2021-08-18 NOTE — Discharge Summary (Signed)
Physician Discharge Summary   Patient: Courtney Mack MRN: [id number] DOB: 04/20/1956  Admit date:     08/14/2021  Discharge date: 08/18/21  Discharge Physician: Fritzi Mandes   PCP: [facility]   Recommendations at discharge:   resume taking your Xarelto from Tuesday, February 14th follow-up G.I. Dr. 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 On EMS arrival, patient was found to be tachycardic and hypoglycemic with blood sugar readings of 23   Symptomatic Anemia secondary to Acute on Chronic GI Bleed  suspect due to GAVE --Recent GI bleed with positive bleeding scan 07/18/2021 at level of splenic flexure s/p embolization with Vascular surgery history of chronic anticoagulation secondary to DVT and PE -- CT abdomen pelvis without any source of bleeding -- hemoglobin of 4.7---3 unit blood transfusion-- 7.8-- 8.4 -- G.I. consultation with Dr. Marius Ditch-- plans for EGD and colonoscopy tomorrow -- continue IV Protonix -- bleeding scan negative -- patient on PO iron pills at home --2/10--EGD  Normal examined duodenum.                        - Normal esophagus.                        - Gastric antral vascular ectasia without bleeding.                         Treated with argon plasma coagulation (APC).                        - Gastric mucosal atrophy. Biopsied.                        - Normal gastroesophageal junction and esophagus --resume diet per G.I. Okay to resume eliquis after three days per Dr. Marius Ditch.  --2.11--hgb 9.2  COVID  infection Asymptomatic/no pneumonia -- completed Remdesivir -- sats stable on room air -- PRN inhalers   Chronic HFpEF (last known EF 55%) Hypertension Hx: HLD  -- blood pressure remains soft -- decreased losartan to 25 mg qd -- continue statins   Hx of Bilateral DVT/PE history of IVC filter -Hold Xarelto for now-- per G.I. okay to resume after three days (from 2/14)   Diabetes mellitus Now with episode of hypoglycemia  BG 23 on presentation --Hold home Metformin. Patient likely will not need at discharge given very stable sugars -- sugars very stable. -- A1c 6.0 -- will discontinue sliding scale --sugars well controlled-- will d/c metformin     Procedures: EGD Family communication : daughter Graciella Belton 2/10 Consults : G.I. CODE STATUS: full DVT Prophylaxis : SCD Level of care: Med-Surg Status is: Inpatient Remains inpatient appropriate because: symptomatic anemia   D[facility] with [facility]         Consultants: GI Procedures performed: EGD  Disposition: Home health Diet recommendation:  Discharge Diet Orders (From admission, onward)     Start     Ordered   08/18/21 0000  Diet - low sodium heart healthy        08/18/21 1247           Carb modified diet  DISCHARGE MEDICATION: Allergies as of 08/18/2021   No Known Allergies      Medication List     STOP taking these medications    metFORMIN 500 MG tablet Commonly known as: GLUCOPHAGE       TAKE these medications    cyanocobalamin 1000 MCG tablet Take 1 tablet (1,000 mcg total) by mouth daily. Start taking on: August 19, 2021   [facility] 100-5 MCG/ACT Aero Generic drug: mometasone-formoterol Inhale 2 puffs into the lungs 2 (two) times daily.   Iron 325 (65 Fe) MG Tabs Take 325 mg by mouth every other day.   loperamide 2 MG capsule Commonly known as: IMODIUM Take 1 capsule (2 mg total) by mouth as needed for diarrhea or loose stools.   losartan 25 MG tablet Commonly known as: COZAAR Take 1  tablet (25 mg total) by mouth daily. What changed: when to take this   multivitamin with minerals Tabs tablet Take 1 tablet by mouth daily.   pantoprazole 40 MG tablet Commonly known as: PROTONIX Take 1 tablet (40 mg total) by mouth daily.   rivaroxaban 20 MG Tabs tablet Commonly known as: XARELTO Take 1 tablet (20 mg total) by mouth daily with supper. Start taking on: August 21, 2021 What changed: These instructions start on August 21, 2021. If you are unsure what to do until then, ask your doctor or other care provider.   rosuvastatin 10 MG tablet Commonly known as: CRESTOR Take 1 tablet (10 mg total) by mouth daily.   simethicone 80 MG chewable tablet Commonly known as: MYLICON Chew 1 tablet (80 mg total) by mouth 4 (four) times daily.               Discharge Care Instructions  (From admission, onward)           Start     Ordered   08/18/21 0000  Discharge wound care:       Comments: Foam padding as needed   08/18/21 1247            [place], [facility]. Schedule an appointment as soon as possible for a visit in 1 week(s).   Specialty: General Practice Why: hosp  f/u Contact information: [address] [place] [address] [phone]         Lin Landsman, MD. Schedule an appointment as soon as possible for a visit in 1 week(s).   Specialty: Gastroenterology Why: gi bleed Contact information: [place] [address] [phone]                 Discharge Exam: Danley Danker Weights   08/15/21 1745 08/17/21 1334 08/18/21 0500  Weight: 99.3 kg 99.3 kg 101.3 kg     Condition at discharge: fair  The results of significant diagnostics from this hospitalization (including imaging, microbiology, ancillary and laboratory) are listed below for reference.   Imaging Studies: DG Chest 2 View  Result Date: 08/14/2021 CLINICAL DATA:  Weakness, shortness of  breath EXAM: CHEST - 2 VIEW COMPARISON:  07/18/2021 FINDINGS: The heart size and mediastinal contours are within normal limits. Both lungs are clear. The visualized skeletal structures are unremarkable. IMPRESSION: No active cardiopulmonary disease. Electronically Signed   By: Rolm Baptise M.D.   On: 08/14/2021 20:20  CT HEAD WO CONTRAST (5MM)  Result Date: 08/14/2021 CLINICAL DATA:  Headache, new or worsening (Age >= 50y). Hypoglycemia. EXAM: CT HEAD WITHOUT CONTRAST TECHNIQUE: Contiguous axial images were obtained from the base of the skull through the vertex without intravenous contrast. RADIATION DOSE REDUCTION: This exam was performed according to the departmental dose-optimization program which includes automated exposure control, adjustment of the mA and/or kV according to patient size and/or use of iterative reconstruction technique. COMPARISON:  06/20/2021 FINDINGS: Brain: No acute intracranial abnormality. Specifically, no hemorrhage, hydrocephalus, mass lesion, acute infarction, or significant intracranial injury. Vascular: No hyperdense vessel or unexpected calcification. Skull: No acute calvarial abnormality. Sinuses/Orbits: No acute findings. Mucosal thickening throughout the ethmoid air cells. Other: None IMPRESSION: No acute intracranial abnormality. Electronically Signed   By: Rolm Baptise M.D.   On: 08/14/2021 20:09   NM GI Blood Loss  Result Date: 08/15/2021 CLINICAL DATA:  Recurrent GI bleeding with anemia despite transfusions. EXAM: NUCLEAR MEDICINE GASTROINTESTINAL BLEEDING SCAN TECHNIQUE: Sequential abdominal images were obtained following intravenous administration of Tc-75m labeled red blood cells. RADIOPHARMACEUTICALS:  20.0 mCi Tc-36m pertechnetate in-vitro labeled red cells. COMPARISON:  Abdominal CTA 08/14/2021. Nuclear medicine GI bleeding study 07/18/2021. FINDINGS: Anterior imaging was performed through 2 hours. No active gastrointestinal bleeding is identified. There is  progressive activity within the perineum, well inferior to the bladder, similar to previous study and probably urine contamination. There is normal blood pool activity. Atypical configuration of activity in the left upper quadrant of the abdomen is non moveable and unchanged from the previous bleeding study, likely due to an overlap of cardiac and splenic activity. IMPRESSION: No evidence of active gastrointestinal bleeding. Electronically Signed   By: Richardean Sale M.D.   On: 08/15/2021 16:08   CT Angio Abd/Pel W and/or Wo Contrast  Result Date: 08/14/2021 CLINICAL DATA:  Lower GI bleed symptomatic anemia EXAM: CTA ABDOMEN AND PELVIS WITHOUT AND WITH CONTRAST TECHNIQUE: Multidetector CT imaging of the abdomen and pelvis was performed using the standard protocol during bolus administration of intravenous contrast. Multiplanar reconstructed images and MIPs were obtained and reviewed to evaluate the vascular anatomy. RADIATION DOSE REDUCTION: This exam was performed according to the departmental dose-optimization program which includes automated exposure control, adjustment of the mA and/or kV according to patient size and/or use of iterative reconstruction technique. CONTRAST:  14mL OMNIPAQUE IOHEXOL 350 MG/ML SOLN COMPARISON:  RBC scan 07/18/2021, CT 06/20/2021 FINDINGS: VASCULAR Aorta: Mild atherosclerosis. No aneurysm, dissection, stenosis or occlusion Celiac: Moderate calcification at the origin without high-grade stenosis. Distal vascular patency. No aneurysm. SMA: Origin calcification. Mild mural thickening at the origin of the SMA results in mild stenosis. No aneurysm. Renals: Single right and single left renal arteries with calcification at the origins. Suspect mild stenosis of the proximal right renal artery just distal to the origin. IMA: Mild to moderate stenosis at the origin but widely patent distally. Inflow: Mild atherosclerosis. No aneurysm dissection or occlusive disease. Proximal Outflow:  Bilateral common femoral and visualized portions of the superficial and profunda femoral arteries are patent without evidence of aneurysm, dissection, vasculitis or significant stenosis. Veins: IVC filter at the L3 level. Diminutive appearing distal IVC and common iliac veins possibly due to chronic occlusion. Numerous collateral vessels in the retroperitoneum and anterior abdominal wall. Review of the MIP images confirms the above findings. NON-VASCULAR Lower chest: 6 lung bases demonstrate no acute consolidation or effusion. Normal cardiac size. Trace pericardial effusion Hepatobiliary: No focal liver abnormality is seen. No gallstones, gallbladder wall thickening, or  biliary dilatation. Pancreas: Unremarkable. No pancreatic ductal dilatation or surrounding inflammatory changes. Spleen: Normal in size without focal abnormality. Adrenals/Urinary Tract: Adrenal glands are normal. No hydronephrosis. Probable small parapelvic cysts. The bladder is nearly empty Stomach/Bowel: The stomach is nonenlarged. Radiodense material within the colon. No acute bowel wall thickening. Negative appendix. No intraluminal extravasation of contrast Lymphatic: No increasing lymph nodes. Borderline retroperitoneal lymph nodes without change. Reproductive: Uterus and bilateral adnexa are unremarkable. Other: Negative for pelvic effusion or free air. Generalized subcutaneous edema consistent with anasarca Musculoskeletal: No acute osseous abnormality. Severe degenerative changes of the bilateral hips. IMPRESSION: VASCULAR 1. Negative for active GI bleeding. 2. Atherosclerotic vascular disease without evidence for high-grade stenosis or acute occlusive disease. NON-VASCULAR 1. No CT evidence for acute intra-abdominal or pelvic abnormality. 2. Generalized subcutaneous edema consistent with anasarca. Electronically Signed   By: Donavan Foil M.D.   On: 08/14/2021 23:20    Microbiology: Results for orders placed or performed during the  hospital encounter of 08/14/21  Blood Culture ID Panel (Reflexed)     Status: Abnormal   Collection Time: 08/14/21  7:20 PM  Result Value Ref Range Status   Enterococcus faecalis NOT DETECTED NOT DETECTED Final   Enterococcus Faecium NOT DETECTED NOT DETECTED Final   Listeria monocytogenes NOT DETECTED NOT DETECTED Final   Staphylococcus species DETECTED (A) NOT DETECTED Final    Comment: CRITICAL RESULT CALLED TO, READ BACK BY AND VERIFIED WITH: NATHAN BELEUE @2356  ON 08/16/21 SKL    Staphylococcus aureus (BCID) NOT DETECTED NOT DETECTED Final   Staphylococcus epidermidis DETECTED (A) NOT DETECTED Final    Comment: Methicillin (oxacillin) resistant coagulase negative staphylococcus. Possible blood culture contaminant (unless isolated from more than one blood culture draw or clinical case suggests pathogenicity). No antibiotic treatment is indicated for blood  culture contaminants. CRITICAL RESULT CALLED TO, READ BACK BY AND VERIFIED WITH: NATHAN BELEUE @2356  ON 08/16/21 SKL    Staphylococcus lugdunensis NOT DETECTED NOT DETECTED Final   Streptococcus species NOT DETECTED NOT DETECTED Final   Streptococcus agalactiae NOT DETECTED NOT DETECTED Final   Streptococcus pneumoniae NOT DETECTED NOT DETECTED Final   Streptococcus pyogenes NOT DETECTED NOT DETECTED Final   A.calcoaceticus-baumannii NOT DETECTED NOT DETECTED Final   Bacteroides fragilis NOT DETECTED NOT DETECTED Final   Enterobacterales NOT DETECTED NOT DETECTED Final   Enterobacter cloacae complex NOT DETECTED NOT DETECTED Final   Escherichia coli NOT DETECTED NOT DETECTED Final   Klebsiella aerogenes NOT DETECTED NOT DETECTED Final   Klebsiella oxytoca NOT DETECTED NOT DETECTED Final   Klebsiella pneumoniae NOT DETECTED NOT DETECTED Final   Proteus species NOT DETECTED NOT DETECTED Final   Salmonella species NOT DETECTED NOT DETECTED Final   Serratia marcescens NOT DETECTED NOT DETECTED Final   Haemophilus influenzae NOT  DETECTED NOT DETECTED Final   Neisseria meningitidis NOT DETECTED NOT DETECTED Final   Pseudomonas aeruginosa NOT DETECTED NOT DETECTED Final   Stenotrophomonas maltophilia NOT DETECTED NOT DETECTED Final   Candida albicans NOT DETECTED NOT DETECTED Final   Candida auris NOT DETECTED NOT DETECTED Final   Candida glabrata NOT DETECTED NOT DETECTED Final   Candida krusei NOT DETECTED NOT DETECTED Final   Candida parapsilosis NOT DETECTED NOT DETECTED Final   Candida tropicalis NOT DETECTED NOT DETECTED Final   Cryptococcus neoformans/gattii NOT DETECTED NOT DETECTED Final   Methicillin resistance mecA/C DETECTED (A) NOT DETECTED Final    Comment: CRITICAL RESULT CALLED TO, READ BACK BY AND VERIFIED WITH: NATHAN BELEUE @2356  ON  08/16/21 SKL Performed at [facility], [address] [place], [place] [address]   Culture, blood (routine x 2)     Status: None (Preliminary result)   Collection Time: 08/14/21  7:25 PM   Specimen: BLOOD  Result Value Ref Range Status   Specimen Description BLOOD LAC  Final   Special Requests BOTTLES DRAWN AEROBIC AND ANAEROBIC BCAV  Final   Culture   Final    NO GROWTH 4 DAYS Performed at [facility], [address] [place], [place] [address]    Report Status PENDING  Incomplete  Resp Panel by RT-PCR (Flu A&B, Covid) Nasopharyngeal Swab     Status: Abnormal   Collection Time: 08/14/21  7:26 PM   Specimen: Nasopharyngeal Swab; Nasopharyngeal(NP) swabs in vial transport medium  Result Value Ref Range Status   SARS Coronavirus 2 by RT PCR POSITIVE (A) NEGATIVE Final    Comment: (NOTE) SARS-CoV-2 target nucleic acids are DETECTED.  The SARS-CoV-2 RNA is generally detectable in upper respiratory specimens during the acute phase of infection. Positive results are indicative of the presence of the identified virus, but do not rule out bacterial infection or co-infection with other pathogens not detected by the test. Clinical correlation  with patient history and other diagnostic information is necessary to determine patient infection status. The expected result is Negative.  Fact Sheet for Patients: EntrepreneurPulse.com.au  Fact Sheet for Healthcare Providers: IncredibleEmployment.be  This test is not yet approved or cleared by the [place] FDA and  has been authorized for detection and/or diagnosis of SARS-CoV-2 by FDA under an Emergency Use Authorization (EUA).  This EUA will remain in effect (meaning this test can be used) for the duration of  the COVID-19 declaration under Section 564(b)(1) of the A ct, 21 U.S.C. section 360bbb-3(b)(1), unless the authorization is terminated or revoked sooner.     Influenza A by PCR NEGATIVE NEGATIVE Final   Influenza B by PCR NEGATIVE NEGATIVE Final    Comment: (NOTE) The Xpert Xpress SARS-CoV-2/FLU/RSV plus assay is intended as an aid in the diagnosis of influenza from Nasopharyngeal swab specimens and should not be used as a sole basis for treatment. Nasal washings and aspirates are unacceptable for Xpert Xpress SARS-CoV-2/FLU/RSV testing.  Fact Sheet for Patients: EntrepreneurPulse.com.au  Fact Sheet for Healthcare Providers: IncredibleEmployment.be  This test is not yet approved or cleared by the [place] FDA and has been authorized for detection and/or diagnosis of SARS-CoV-2 by FDA under an Emergency Use Authorization (EUA). This EUA will remain in effect (meaning this test can be used) for the duration of the COVID-19 declaration under Section 564(b)(1) of the Act, 21 U.S.C. section 360bbb-3(b)(1), unless the authorization is terminated or revoked.  Performed at [facility], [address] [place], [place] [address]   Culture, blood (routine x 2)     Status: None (Preliminary result)   Collection Time: 08/14/21  7:26 PM   Specimen: BLOOD  Result Value Ref Range  Status   Specimen Description   Final    BLOOD BLRA Performed at [facility], [address] [place], [place] [address]    Special Requests   Final    BOTTLES DRAWN AEROBIC AND ANAEROBIC BCLV Performed at [facility], [address] [place],  [address]    Culture  Setup Time   Final    CORRECTED RESULTS GRAM POSITIVE RODS PREVIOUSLY REPORTED AS: GRAM NEGATIVE RODS CORRECTED RESULTS CALLED TO: PHARMD DUSTIN Z. [id number] FCP ANAEROBIC  BOTTLE ONLY AEROBIC BOTTLE ONLY GRAM POSITIVE COCCI CRITICAL RESULT CALLED TO, READ BACK BY AND VERIFIED WITH: JASON BELEUE @2356  ON 08/16/21 SKL    Culture   Final    CORRECTED RESULTS BACILLUS SPECIES Standardized susceptibility testing for this organism is not available. PREVIOUSLY REPORTED AS: GRAM NEGATIVE RODS CORRECTED RESULTS CALLED TO: PHARMD DUSTIN Z. [id number] FCP STAPHYLOCOCCUS EPIDERMIDIS THE SIGNIFICANCE OF ISOLATING THIS ORGANISM FROM A SINGLE SET OF BLOOD CULTURES WHEN MULTIPLE SETS ARE DRAWN IS UNCERTAIN. PLEASE NOTIFY THE MICROBIOLOGY DEPARTMENT WITHIN ONE WEEK IF SPECIATION AND SENSITIVITIES ARE REQUIRED. Performed at [facility], [place] [address], [place], [place] [address]    Report Status PENDING  Incomplete  Blood Culture ID Panel (Reflexed)     Status: None   Collection Time: 08/14/21  7:26 PM  Result Value Ref Range Status   Enterococcus faecalis NOT DETECTED NOT DETECTED Final   Enterococcus Faecium NOT DETECTED NOT DETECTED Final   Listeria monocytogenes NOT DETECTED NOT DETECTED Final   Staphylococcus species NOT DETECTED NOT DETECTED Final   Staphylococcus aureus (BCID) NOT DETECTED NOT DETECTED Final   Staphylococcus epidermidis NOT DETECTED NOT DETECTED Final   Staphylococcus lugdunensis NOT DETECTED NOT DETECTED Final   Streptococcus species NOT DETECTED NOT DETECTED Final   Streptococcus agalactiae NOT DETECTED NOT DETECTED Final   Streptococcus pneumoniae NOT DETECTED NOT  DETECTED Final   Streptococcus pyogenes NOT DETECTED NOT DETECTED Final   A.calcoaceticus-baumannii NOT DETECTED NOT DETECTED Final   Bacteroides fragilis NOT DETECTED NOT DETECTED Final   Enterobacterales NOT DETECTED NOT DETECTED Final   Enterobacter cloacae complex NOT DETECTED NOT DETECTED Final   Escherichia coli NOT DETECTED NOT DETECTED Final   Klebsiella aerogenes NOT DETECTED NOT DETECTED Final   Klebsiella oxytoca NOT DETECTED NOT DETECTED Final   Klebsiella pneumoniae NOT DETECTED NOT DETECTED Final   Proteus species NOT DETECTED NOT DETECTED Final   Salmonella species NOT DETECTED NOT DETECTED Final   Serratia marcescens NOT DETECTED NOT DETECTED Final   Haemophilus influenzae NOT DETECTED NOT DETECTED Final   Neisseria meningitidis NOT DETECTED NOT DETECTED Final   Pseudomonas aeruginosa NOT DETECTED NOT DETECTED Final   Stenotrophomonas maltophilia NOT DETECTED NOT DETECTED Final   Candida albicans NOT DETECTED NOT DETECTED Final   Candida auris NOT DETECTED NOT DETECTED Final   Candida glabrata NOT DETECTED NOT DETECTED Final   Candida krusei NOT DETECTED NOT DETECTED Final   Candida parapsilosis NOT DETECTED NOT DETECTED Final   Candida tropicalis NOT DETECTED NOT DETECTED Final   Cryptococcus neoformans/gattii NOT DETECTED NOT DETECTED Final    Comment: Performed at [facility], [address] [place], [place] [address]  Urine Culture     Status: Abnormal   Collection Time: 08/14/21  7:26 PM   Specimen: In/Out Cath Urine  Result Value Ref Range Status   Specimen Description   Final    IN/OUT CATH URINE Performed at [facility], [address], [place], [place] [address]    Special Requests   Final    NONE Performed at [facility], [address] [place], [place] [address]    Culture MULTIPLE SPECIES PRESENT, SUGGEST RECOLLECTION (A)  Final   Report Status 08/16/2021 FINAL  Final  CULTURE, BLOOD (ROUTINE X 2) w Reflex to ID  Panel     Status: None (Preliminary result)   Collection Time: 08/15/21  4:02 PM   Specimen: BLOOD  Result Value Ref Range Status   Specimen Description  BLOOD BLOOD RIGHT FOREARM  Final   Special Requests   Final    BOTTLES DRAWN AEROBIC AND ANAEROBIC Blood Culture adequate volume   Culture   Final    NO GROWTH 3 DAYS Performed at [facility], [address] [place], [place] [address]    Report Status PENDING  Incomplete  CULTURE, BLOOD (ROUTINE X 2) w Reflex to ID Panel     Status: None (Preliminary result)   Collection Time: 08/15/21  4:15 PM   Specimen: BLOOD  Result Value Ref Range Status   Specimen Description BLOOD BLOOD LEFT WRIST  Final   Special Requests   Final    BOTTLES DRAWN AEROBIC AND ANAEROBIC Blood Culture adequate volume   Culture   Final    NO GROWTH 3 DAYS Performed at [facility], [address] [place], [place] [address]    Report Status PENDING  Incomplete  MRSA Next Gen by PCR, Nasal     Status: Abnormal   Collection Time: 08/15/21  6:00 PM   Specimen: Nasal Mucosa; Nasal Swab  Result Value Ref Range Status   MRSA by PCR Next Gen DETECTED (A) NOT DETECTED Final    Comment: RESULT CALLED TO, READ BACK BY AND VERIFIED WITH: KELLY GARCIA 08/15/21 1933 MU (NOTE) The GeneXpert MRSA Assay (FDA approved for NASAL specimens only), is one component of a comprehensive MRSA colonization surveillance program. It is not intended to diagnose MRSA infection nor to guide or monitor treatment for MRSA infections. Test performance is not FDA approved in patients less than 13 years old. Performed at [facility], [address] [place], [place] [address]     Labs: CBC: Recent Labs  Lab 08/14/21 1845 08/15/21 0532 08/15/21 1602 08/16/21 0502 08/16/21 1126 08/17/21 0053 08/17/21 1929 08/18/21 0107  WBC 4.8 4.0  --  4.1  --   --   --   --   NEUTROABS 3.4  --   --  2.9  --   --   --   --   HGB 4.6* 5.2*   < > 7.8* 8.2* 8.7*  9.5* 9.2*  HCT 16.4* 17.9*   < > 24.5* 25.9* 28.1* 31.4* 29.7*  MCV 91.1 89.9  --  85.7  --   --   --   --   PLT 288 249  --  245  --   --   --   --    < > = values in this interval not displayed.   Basic Metabolic Panel: Recent Labs  Lab 08/14/21 1845 08/15/21 0532 08/16/21 0502 08/17/21 0326 08/18/21 0107  NA 137  --  136  --   --   K 4.1  --  3.5  --   --   CL 106  --  103  --   --   CO2 25  --  26  --   --   GLUCOSE 131*  --  87  --   --   BUN 24*  --  15  --   --   CREATININE 0.71  --  0.59  --   --   CALCIUM 8.9  --  8.0*  --   --   MG  --  1.6* 1.8  --   --   PHOS  --  3.6 3.4 3.7 3.5   Liver Function Tests: Recent Labs  Lab 08/14/21 1845 08/16/21 0502  AST 19 20  ALT 9 9  ALKPHOS 38 33*  BILITOT 0.6 1.5*  PROT 6.2* 5.6*  ALBUMIN 2.9* 2.7*   CBG: Recent Labs  Lab 08/16/21 0333 08/16/21 0811 08/16/21 1124 08/17/21 0833 08/17/21 1341  GLUCAP 94 88 104* 94 97    Discharge time spent: greater than 30 minutes.  Signed: Fritzi Mandes, MD [facility] 08/18/2021

## 2021-08-18 NOTE — Progress Notes (Signed)
Patient has been discharged home.  Discharge papers given and explained to patient.  She verbalized understanding. Meds and f/u appointments reviewed.  Rx was sent electronically to the pharmacy.  Patient made aware.

## 2021-08-19 LAB — CULTURE, BLOOD (ROUTINE X 2): Culture: NO GROWTH

## 2021-08-20 ENCOUNTER — Encounter: Payer: Self-pay | Admitting: Gastroenterology

## 2021-08-20 LAB — CULTURE, BLOOD (ROUTINE X 2)
Culture: NO GROWTH
Culture: NO GROWTH
Special Requests: ADEQUATE
Special Requests: ADEQUATE

## 2021-08-20 LAB — GLUCOSE, CAPILLARY: Glucose-Capillary: 111 mg/dL — ABNORMAL HIGH (ref 70–99)

## 2021-08-20 NOTE — Anesthesia Postprocedure Evaluation (Signed)
Anesthesia Post Note  Patient: Courtney Mack  Procedure(s) Performed: ESOPHAGOGASTRODUODENOSCOPY (EGD) WITH PROPOFOL ENTEROSCOPY  Patient location during evaluation: PACU Anesthesia Type: General Level of consciousness: awake and alert, oriented and patient cooperative Pain management: pain level controlled Vital Signs Assessment: post-procedure vital signs reviewed and stable Respiratory status: spontaneous breathing, nonlabored ventilation and respiratory function stable Cardiovascular status: blood pressure returned to baseline and stable Postop Assessment: adequate PO intake Anesthetic complications: no   No notable events documented.   Last Vitals:  Vitals:   08/18/21 0807 08/18/21 1141  BP: (!) 115/59 125/60  Pulse: 81 86  Resp: 17 17  Temp: 36.8 C 37 C  SpO2: 93% 93%    Last Pain:  Vitals:   08/18/21 1141  TempSrc: Oral  PainSc:                  Darrin Nipper

## 2021-08-21 ENCOUNTER — Telehealth: Payer: Self-pay

## 2021-08-21 LAB — SURGICAL PATHOLOGY

## 2021-08-21 NOTE — Telephone Encounter (Signed)
Per EGD patient needed to be seen in 4 weeks for a hospital follow up. Made appointment 10/01/21 at 1:00

## 2021-08-22 LAB — TYPE AND SCREEN
ABO/RH(D): A POS
Antibody Screen: POSITIVE
Donor AG Type: NEGATIVE
Donor AG Type: NEGATIVE
Donor AG Type: NEGATIVE
Unit division: 0
Unit division: 0
Unit division: 0
Unit division: 0
Unit division: 0

## 2021-08-22 LAB — BPAM RBC
Blood Product Expiration Date: 202302112359
Blood Product Expiration Date: 202303042359
Blood Product Expiration Date: 202303132359
Blood Product Expiration Date: 202303142359
Blood Product Expiration Date: 202303142359
ISSUE DATE / TIME: 202302080247
ISSUE DATE / TIME: 202302080905
ISSUE DATE / TIME: 202302081801
ISSUE DATE / TIME: 202302120205
Unit Type and Rh: 5100
Unit Type and Rh: 5100
Unit Type and Rh: 6200
Unit Type and Rh: 6200
Unit Type and Rh: 6200

## 2021-09-11 ENCOUNTER — Inpatient Hospital Stay: Payer: Medicare (Managed Care) | Admitting: Anesthesiology

## 2021-09-11 ENCOUNTER — Inpatient Hospital Stay
Admission: EM | Admit: 2021-09-11 | Discharge: 2021-09-18 | DRG: 853 | Disposition: A | Discharge status: Skilled Nursing Facility | Payer: Medicare (Managed Care) | Attending: Internal Medicine | Admitting: Internal Medicine

## 2021-09-11 ENCOUNTER — Encounter
Admission: EM | Disposition: A | Discharge status: Skilled Nursing Facility | Payer: Self-pay | Source: Home / Self Care | Attending: Internal Medicine

## 2021-09-11 ENCOUNTER — Emergency Department: Payer: Medicare (Managed Care)

## 2021-09-11 ENCOUNTER — Inpatient Hospital Stay: Payer: Medicare (Managed Care)

## 2021-09-11 ENCOUNTER — Encounter: Payer: Self-pay | Admitting: Emergency Medicine

## 2021-09-11 DIAGNOSIS — Z8616 Personal history of COVID-19: Secondary | ICD-10-CM | POA: Diagnosis not present

## 2021-09-11 DIAGNOSIS — E119 Type 2 diabetes mellitus without complications: Secondary | ICD-10-CM

## 2021-09-11 DIAGNOSIS — N201 Calculus of ureter: Secondary | ICD-10-CM | POA: Diagnosis not present

## 2021-09-11 DIAGNOSIS — N136 Pyonephrosis: Secondary | ICD-10-CM | POA: Diagnosis present

## 2021-09-11 DIAGNOSIS — J189 Pneumonia, unspecified organism: Secondary | ICD-10-CM | POA: Diagnosis present

## 2021-09-11 DIAGNOSIS — Z803 Family history of malignant neoplasm of breast: Secondary | ICD-10-CM | POA: Diagnosis not present

## 2021-09-11 DIAGNOSIS — C541 Malignant neoplasm of endometrium: Secondary | ICD-10-CM | POA: Diagnosis present

## 2021-09-11 DIAGNOSIS — Z833 Family history of diabetes mellitus: Secondary | ICD-10-CM | POA: Diagnosis not present

## 2021-09-11 DIAGNOSIS — Z515 Encounter for palliative care: Secondary | ICD-10-CM | POA: Diagnosis not present

## 2021-09-11 DIAGNOSIS — I110 Hypertensive heart disease with heart failure: Secondary | ICD-10-CM | POA: Diagnosis present

## 2021-09-11 DIAGNOSIS — D696 Thrombocytopenia, unspecified: Secondary | ICD-10-CM | POA: Diagnosis present

## 2021-09-11 DIAGNOSIS — D509 Iron deficiency anemia, unspecified: Secondary | ICD-10-CM | POA: Diagnosis present

## 2021-09-11 DIAGNOSIS — B964 Proteus (mirabilis) (morganii) as the cause of diseases classified elsewhere: Secondary | ICD-10-CM | POA: Diagnosis present

## 2021-09-11 DIAGNOSIS — Z87891 Personal history of nicotine dependence: Secondary | ICD-10-CM

## 2021-09-11 DIAGNOSIS — Z86718 Personal history of other venous thrombosis and embolism: Secondary | ICD-10-CM

## 2021-09-11 DIAGNOSIS — E8770 Fluid overload, unspecified: Secondary | ICD-10-CM | POA: Diagnosis not present

## 2021-09-11 DIAGNOSIS — I10 Essential (primary) hypertension: Secondary | ICD-10-CM | POA: Diagnosis not present

## 2021-09-11 DIAGNOSIS — Z7951 Long term (current) use of inhaled steroids: Secondary | ICD-10-CM

## 2021-09-11 DIAGNOSIS — E538 Deficiency of other specified B group vitamins: Secondary | ICD-10-CM | POA: Diagnosis present

## 2021-09-11 DIAGNOSIS — A419 Sepsis, unspecified organism: Secondary | ICD-10-CM | POA: Diagnosis present

## 2021-09-11 DIAGNOSIS — I2782 Chronic pulmonary embolism: Secondary | ICD-10-CM | POA: Diagnosis present

## 2021-09-11 DIAGNOSIS — J440 Chronic obstructive pulmonary disease with acute lower respiratory infection: Secondary | ICD-10-CM | POA: Diagnosis present

## 2021-09-11 DIAGNOSIS — Z20822 Contact with and (suspected) exposure to covid-19: Secondary | ICD-10-CM | POA: Diagnosis present

## 2021-09-11 DIAGNOSIS — Z79899 Other long term (current) drug therapy: Secondary | ICD-10-CM

## 2021-09-11 DIAGNOSIS — J441 Chronic obstructive pulmonary disease with (acute) exacerbation: Secondary | ICD-10-CM | POA: Diagnosis present

## 2021-09-11 DIAGNOSIS — J9811 Atelectasis: Secondary | ICD-10-CM | POA: Diagnosis present

## 2021-09-11 DIAGNOSIS — E7800 Pure hypercholesterolemia, unspecified: Secondary | ICD-10-CM | POA: Diagnosis present

## 2021-09-11 DIAGNOSIS — A4189 Other specified sepsis: Secondary | ICD-10-CM | POA: Diagnosis present

## 2021-09-11 DIAGNOSIS — Z7189 Other specified counseling: Secondary | ICD-10-CM | POA: Diagnosis not present

## 2021-09-11 DIAGNOSIS — E440 Moderate protein-calorie malnutrition: Secondary | ICD-10-CM | POA: Diagnosis present

## 2021-09-11 DIAGNOSIS — Z83438 Family history of other disorder of lipoprotein metabolism and other lipidemia: Secondary | ICD-10-CM

## 2021-09-11 DIAGNOSIS — Z7901 Long term (current) use of anticoagulants: Secondary | ICD-10-CM

## 2021-09-11 DIAGNOSIS — I5033 Acute on chronic diastolic (congestive) heart failure: Secondary | ICD-10-CM | POA: Diagnosis present

## 2021-09-11 DIAGNOSIS — N342 Other urethritis: Secondary | ICD-10-CM | POA: Diagnosis not present

## 2021-09-11 DIAGNOSIS — D649 Anemia, unspecified: Secondary | ICD-10-CM | POA: Diagnosis present

## 2021-09-11 DIAGNOSIS — Z8542 Personal history of malignant neoplasm of other parts of uterus: Secondary | ICD-10-CM | POA: Diagnosis not present

## 2021-09-11 DIAGNOSIS — N179 Acute kidney failure, unspecified: Secondary | ICD-10-CM | POA: Diagnosis present

## 2021-09-11 DIAGNOSIS — Z452 Encounter for adjustment and management of vascular access device: Secondary | ICD-10-CM

## 2021-09-11 DIAGNOSIS — R6521 Severe sepsis with septic shock: Secondary | ICD-10-CM | POA: Diagnosis present

## 2021-09-11 DIAGNOSIS — Z6841 Body Mass Index (BMI) 40.0 and over, adult: Secondary | ICD-10-CM | POA: Diagnosis not present

## 2021-09-11 DIAGNOSIS — D72829 Elevated white blood cell count, unspecified: Secondary | ICD-10-CM | POA: Diagnosis not present

## 2021-09-11 DIAGNOSIS — Z8249 Family history of ischemic heart disease and other diseases of the circulatory system: Secondary | ICD-10-CM

## 2021-09-11 DIAGNOSIS — I2699 Other pulmonary embolism without acute cor pulmonale: Secondary | ICD-10-CM | POA: Diagnosis present

## 2021-09-11 DIAGNOSIS — N390 Urinary tract infection, site not specified: Secondary | ICD-10-CM | POA: Diagnosis present

## 2021-09-11 HISTORY — PX: CYSTOSCOPY W/ URETERAL STENT PLACEMENT: SHX1429

## 2021-09-11 LAB — CBC
HCT: 28 % — ABNORMAL LOW (ref 36.0–46.0)
Hemoglobin: 8.5 g/dL — ABNORMAL LOW (ref 12.0–15.0)
MCH: 26.4 pg (ref 26.0–34.0)
MCHC: 30.4 g/dL (ref 30.0–36.0)
MCV: 87 fL (ref 80.0–100.0)
Platelets: 123 10*3/uL — ABNORMAL LOW (ref 150–400)
RBC: 3.22 MIL/uL — ABNORMAL LOW (ref 3.87–5.11)
RDW: 17.5 % — ABNORMAL HIGH (ref 11.5–15.5)
WBC: 11.9 10*3/uL — ABNORMAL HIGH (ref 4.0–10.5)
nRBC: 0 % (ref 0.0–0.2)

## 2021-09-11 LAB — COMPREHENSIVE METABOLIC PANEL
ALT: 11 U/L (ref 0–44)
AST: 46 U/L — ABNORMAL HIGH (ref 15–41)
Albumin: 2.4 g/dL — ABNORMAL LOW (ref 3.5–5.0)
Alkaline Phosphatase: 38 U/L (ref 38–126)
Anion gap: 8 (ref 5–15)
BUN: 20 mg/dL (ref 8–23)
CO2: 19 mmol/L — ABNORMAL LOW (ref 22–32)
Calcium: 7.7 mg/dL — ABNORMAL LOW (ref 8.9–10.3)
Chloride: 112 mmol/L — ABNORMAL HIGH (ref 98–111)
Creatinine, Ser: 0.97 mg/dL (ref 0.44–1.00)
GFR, Estimated: >60 mL/min (ref >60)
Glucose, Bld: 103 mg/dL — ABNORMAL HIGH (ref 70–99)
Potassium: 3.9 mmol/L (ref 3.5–5.1)
Sodium: 139 mmol/L (ref 135–145)
Total Bilirubin: 1.8 mg/dL — ABNORMAL HIGH (ref 0.3–1.2)
Total Protein: 5.4 g/dL — ABNORMAL LOW (ref 6.5–8.1)

## 2021-09-11 LAB — CBC WITH DIFFERENTIAL/PLATELET
Abs Immature Granulocytes: 0.06 10*3/uL (ref 0.00–0.07)
Basophils Absolute: 0 10*3/uL (ref 0.0–0.1)
Basophils Relative: 0 %
Eosinophils Absolute: 0 10*3/uL (ref 0.0–0.5)
Eosinophils Relative: 0 %
HCT: 30 % — ABNORMAL LOW (ref 36.0–46.0)
Hemoglobin: 8.8 g/dL — ABNORMAL LOW (ref 12.0–15.0)
Immature Granulocytes: 1 %
Lymphocytes Relative: 3 %
Lymphs Abs: 0.3 10*3/uL — ABNORMAL LOW (ref 0.7–4.0)
MCH: 26 pg (ref 26.0–34.0)
MCHC: 29.3 g/dL — ABNORMAL LOW (ref 30.0–36.0)
MCV: 88.8 fL (ref 80.0–100.0)
Monocytes Absolute: 0.5 10*3/uL (ref 0.1–1.0)
Monocytes Relative: 5 %
Neutro Abs: 8.4 10*3/uL — ABNORMAL HIGH (ref 1.7–7.7)
Neutrophils Relative %: 91 %
Platelets: 126 10*3/uL — ABNORMAL LOW (ref 150–400)
RBC: 3.38 MIL/uL — ABNORMAL LOW (ref 3.87–5.11)
RDW: 17.7 % — ABNORMAL HIGH (ref 11.5–15.5)
WBC: 9.3 10*3/uL (ref 4.0–10.5)
nRBC: 0 % (ref 0.0–0.2)

## 2021-09-11 LAB — PROTIME-INR
INR: 1.3 — ABNORMAL HIGH (ref 0.8–1.2)
Prothrombin Time: 16.1 seconds — ABNORMAL HIGH (ref 11.4–15.2)

## 2021-09-11 LAB — BLOOD CULTURE ID PANEL (REFLEXED) - BCID2

## 2021-09-11 LAB — URINALYSIS, COMPLETE (UACMP) WITH MICROSCOPIC
Bacteria, UA: NONE SEEN
Bilirubin Urine: NEGATIVE
Glucose, UA: NEGATIVE mg/dL
Hgb urine dipstick: NEGATIVE
Ketones, ur: 5 mg/dL — AB
Leukocytes,Ua: NEGATIVE
Nitrite: NEGATIVE
Protein, ur: NEGATIVE mg/dL
Specific Gravity, Urine: >1.046 — ABNORMAL HIGH (ref 1.005–1.030)
pH: 5 (ref 5.0–8.0)

## 2021-09-11 LAB — LACTIC ACID, PLASMA
Lactic Acid, Venous: 2.5 mmol/L (ref 0.5–1.9)
Lactic Acid, Venous: 4 mmol/L (ref 0.5–1.9)
Lactic Acid, Venous: 4.9 mmol/L (ref 0.5–1.9)

## 2021-09-11 LAB — APTT: aPTT: 35 seconds (ref 24–36)

## 2021-09-11 LAB — CREATININE, SERUM
Creatinine, Ser: 1.26 mg/dL — ABNORMAL HIGH (ref 0.44–1.00)
GFR, Estimated: 47 mL/min — ABNORMAL LOW (ref >60)

## 2021-09-11 LAB — PROCALCITONIN: Procalcitonin: 60.4 ng/mL

## 2021-09-11 LAB — FIBRINOGEN: Fibrinogen: 422 mg/dL (ref 210–475)

## 2021-09-11 LAB — TROPONIN I (HIGH SENSITIVITY)
Troponin I (High Sensitivity): 23 ng/L — ABNORMAL HIGH (ref <18)
Troponin I (High Sensitivity): 29 ng/L — ABNORMAL HIGH (ref <18)

## 2021-09-11 LAB — RESP PANEL BY RT-PCR (FLU A&B, COVID) ARPGX2
Influenza A by PCR: NEGATIVE
Influenza B by PCR: NEGATIVE
SARS Coronavirus 2 by RT PCR: NEGATIVE

## 2021-09-11 LAB — GRAM STAIN

## 2021-09-11 LAB — BRAIN NATRIURETIC PEPTIDE: B Natriuretic Peptide: 473.3 pg/mL — ABNORMAL HIGH (ref 0.0–100.0)

## 2021-09-11 LAB — CORTISOL: Cortisol, Plasma: 44.1 ug/dL

## 2021-09-11 LAB — MRSA NEXT GEN BY PCR, NASAL: MRSA by PCR Next Gen: NOT DETECTED

## 2021-09-11 LAB — STREP PNEUMONIAE URINARY ANTIGEN: Strep Pneumo Urinary Antigen: NEGATIVE

## 2021-09-11 LAB — GLUCOSE, CAPILLARY: Glucose-Capillary: 142 mg/dL — ABNORMAL HIGH (ref 70–99)

## 2021-09-11 SURGERY — CYSTOSCOPY, WITH RETROGRADE PYELOGRAM AND URETERAL STENT INSERTION
Anesthesia: General | Laterality: Left

## 2021-09-11 MED ORDER — ONDANSETRON HCL 4 MG/2ML IJ SOLN: 4.0000 mg | Freq: Four times a day (QID) | INTRAMUSCULAR | Status: DC | PRN

## 2021-09-11 MED ORDER — KETAMINE HCL 50 MG/5ML IJ SOSY
PREFILLED_SYRINGE | INTRAMUSCULAR | Status: AC
  Filled 2021-09-11: qty 5

## 2021-09-11 MED ORDER — SODIUM CHLORIDE 0.9 % IV SOLN
2.0000 g | Freq: Two times a day (BID) | INTRAVENOUS | Status: DC
  Filled 2021-09-11 (×2): qty 2

## 2021-09-11 MED ORDER — LEVALBUTEROL HCL 1.25 MG/0.5ML IN NEBU
1.2500 mg | INHALATION_SOLUTION | Freq: Four times a day (QID) | RESPIRATORY_TRACT | Status: DC
Start: 2021-09-11 — End: 2021-09-14
  Administered 2021-09-11 – 2021-09-14 (×11): 1.25 mg via RESPIRATORY_TRACT
  Filled 2021-09-11 (×11): qty 0.5

## 2021-09-11 MED ORDER — VANCOMYCIN HCL IN DEXTROSE 1-5 GM/200ML-% IV SOLN
1000.0000 mg | INTRAVENOUS | Status: DC
Start: 2021-09-12 — End: 2021-09-11
  Filled 2021-09-11: qty 200

## 2021-09-11 MED ORDER — PHENTOLAMINE MESYLATE 5 MG IJ SOLR
5.0000 mg | Freq: Once | INTRAMUSCULAR | Status: AC
  Administered 2021-09-11: 5 mg via SUBCUTANEOUS
  Filled 2021-09-11: qty 5

## 2021-09-11 MED ORDER — VANCOMYCIN HCL 2000 MG/400ML IV SOLN
2000.0000 mg | Freq: Once | INTRAVENOUS | Status: AC
  Administered 2021-09-11: 2000 mg via INTRAVENOUS
  Filled 2021-09-11: qty 400

## 2021-09-11 MED ORDER — MIDAZOLAM HCL 2 MG/2ML IJ SOLN
1.0000 mg | Freq: Once | INTRAMUSCULAR | Status: AC
  Administered 2021-09-11: 0.5 mg via INTRAVENOUS
  Filled 2021-09-11: qty 2

## 2021-09-11 MED ORDER — IPRATROPIUM-ALBUTEROL 0.5-2.5 (3) MG/3ML IN SOLN
3.0000 mL | Freq: Once | RESPIRATORY_TRACT | Status: AC
Start: 2021-09-11 — End: 2021-09-11
  Administered 2021-09-11: 3 mL via RESPIRATORY_TRACT
  Filled 2021-09-11: qty 9

## 2021-09-11 MED ORDER — ACETAMINOPHEN 325 MG RE SUPP
650.0000 mg | Freq: Once | RECTAL | Status: AC
Start: 2021-09-11 — End: 2021-09-11

## 2021-09-11 MED ORDER — LACTATED RINGERS IV BOLUS (SEPSIS)
2000.0000 mL | Freq: Once | INTRAVENOUS | Status: AC
  Administered 2021-09-11: 2000 mL via INTRAVENOUS

## 2021-09-11 MED ORDER — HEPARIN SODIUM (PORCINE) 5000 UNIT/ML IJ SOLN
5000.0000 [IU] | Freq: Three times a day (TID) | INTRAMUSCULAR | Status: DC
  Administered 2021-09-11: 5000 [IU] via SUBCUTANEOUS
  Filled 2021-09-11: qty 1

## 2021-09-11 MED ORDER — ACETAMINOPHEN 325 MG PO TABS
650.0000 mg | ORAL_TABLET | Freq: Four times a day (QID) | ORAL | Status: DC | PRN
  Administered 2021-09-13 – 2021-09-17 (×2): 650 mg via ORAL
  Filled 2021-09-11 (×2): qty 2

## 2021-09-11 MED ORDER — HYDROCORTISONE SOD SUC (PF) 100 MG IJ SOLR
100.0000 mg | Freq: Two times a day (BID) | INTRAMUSCULAR | Status: DC
  Administered 2021-09-11 – 2021-09-14 (×6): 100 mg via INTRAVENOUS
  Filled 2021-09-11 (×7): qty 2

## 2021-09-11 MED ORDER — PROPOFOL 10 MG/ML IV BOLUS
INTRAVENOUS | Status: AC
  Filled 2021-09-11: qty 20

## 2021-09-11 MED ORDER — MIDAZOLAM HCL 2 MG/2ML IJ SOLN
INTRAMUSCULAR | Status: AC
  Filled 2021-09-11: qty 2

## 2021-09-11 MED ORDER — CHLORHEXIDINE GLUCONATE CLOTH 2 % EX PADS
6.0000 | MEDICATED_PAD | Freq: Every day | CUTANEOUS | Status: DC
  Administered 2021-09-11 – 2021-09-18 (×8): 6 via TOPICAL

## 2021-09-11 MED ORDER — VANCOMYCIN HCL IN DEXTROSE 1-5 GM/200ML-% IV SOLN
1000.0000 mg | Freq: Once | INTRAVENOUS | Status: DC
  Filled 2021-09-11: qty 200

## 2021-09-11 MED ORDER — PROPOFOL 10 MG/ML IV BOLUS
INTRAVENOUS | Status: DC | PRN
  Administered 2021-09-11: 25 ug/kg/min via INTRAVENOUS

## 2021-09-11 MED ORDER — LACTATED RINGERS IV SOLN: INTRAVENOUS | Status: AC

## 2021-09-11 MED ORDER — SODIUM CHLORIDE 0.9 % IV SOLN
2.0000 g | Freq: Three times a day (TID) | INTRAVENOUS | Status: DC
  Administered 2021-09-11: 2 g via INTRAVENOUS
  Filled 2021-09-11 (×4): qty 2

## 2021-09-11 MED ORDER — POLYETHYLENE GLYCOL 3350 17 G PO PACK: 17.0000 g | PACK | Freq: Every day | ORAL | Status: DC | PRN

## 2021-09-11 MED ORDER — IOHEXOL 300 MG/ML  SOLN
125.0000 mL | Freq: Once | INTRAMUSCULAR | Status: AC | PRN
  Administered 2021-09-11: 125 mL via INTRAVENOUS

## 2021-09-11 MED ORDER — SODIUM CHLORIDE 0.9 % IV SOLN
2.0000 g | INTRAVENOUS | Status: AC
  Administered 2021-09-12 – 2021-09-14 (×3): 2 g via INTRAVENOUS
  Filled 2021-09-11 (×2): qty 2
  Filled 2021-09-11: qty 20
  Filled 2021-09-11: qty 2

## 2021-09-11 MED ORDER — SODIUM CHLORIDE 0.9 % IV SOLN
2.0000 g | Freq: Once | INTRAVENOUS | Status: AC
  Administered 2021-09-11: 2 g via INTRAVENOUS
  Filled 2021-09-11: qty 2

## 2021-09-11 MED ORDER — NITROGLYCERIN 2 % TD OINT
1.0000 [in_us] | TOPICAL_OINTMENT | Freq: Three times a day (TID) | TRANSDERMAL | Status: AC
  Administered 2021-09-11 – 2021-09-13 (×3): 1 [in_us] via TOPICAL
  Filled 2021-09-11 (×3): qty 1

## 2021-09-11 MED ORDER — IPRATROPIUM-ALBUTEROL 0.5-2.5 (3) MG/3ML IN SOLN
3.0000 mL | Freq: Once | RESPIRATORY_TRACT | Status: AC
  Administered 2021-09-11: 3 mL via RESPIRATORY_TRACT

## 2021-09-11 MED ORDER — ACETAMINOPHEN 325 MG RE SUPP
RECTAL | Status: AC
  Administered 2021-09-11: 650 mg via RECTAL
  Filled 2021-09-11: qty 2

## 2021-09-11 MED ORDER — ALBUTEROL SULFATE HFA 108 (90 BASE) MCG/ACT IN AERS
INHALATION_SPRAY | RESPIRATORY_TRACT | Status: DC | PRN
  Administered 2021-09-11 (×2): 1 via RESPIRATORY_TRACT

## 2021-09-11 MED ORDER — SODIUM CHLORIDE 0.9 % IV SOLN
2.0000 g | Freq: Two times a day (BID) | INTRAVENOUS | Status: AC
  Administered 2021-09-11: 2 g via INTRAVENOUS
  Filled 2021-09-11: qty 2

## 2021-09-11 MED ORDER — HYDROCORTISONE SOD SUC (PF) 100 MG IJ SOLR
100.0000 mg | Freq: Once | INTRAMUSCULAR | Status: AC
  Administered 2021-09-11: 100 mg via INTRAVENOUS
  Filled 2021-09-11: qty 2

## 2021-09-11 MED ORDER — METRONIDAZOLE 500 MG/100ML IV SOLN
500.0000 mg | Freq: Once | INTRAVENOUS | Status: AC
  Administered 2021-09-11: 500 mg via INTRAVENOUS
  Filled 2021-09-11: qty 100

## 2021-09-11 MED ORDER — SODIUM CHLORIDE 0.9% FLUSH
10.0000 mL | Freq: Two times a day (BID) | INTRAVENOUS | Status: DC
  Administered 2021-09-11 – 2021-09-12 (×2): 30 mL
  Administered 2021-09-12: 10 mL
  Administered 2021-09-13: 11:00:00 30 mL
  Administered 2021-09-13: 21:00:00 10 mL
  Administered 2021-09-14: 30 mL
  Administered 2021-09-14 – 2021-09-18 (×7): 10 mL

## 2021-09-11 MED ORDER — IPRATROPIUM BROMIDE 0.02 % IN SOLN
0.5000 mg | Freq: Four times a day (QID) | RESPIRATORY_TRACT | Status: DC
  Administered 2021-09-11 – 2021-09-14 (×11): 0.5 mg via RESPIRATORY_TRACT
  Filled 2021-09-11 (×11): qty 2.5

## 2021-09-11 MED ORDER — MIDAZOLAM HCL 2 MG/2ML IJ SOLN
INTRAMUSCULAR | Status: DC | PRN
  Administered 2021-09-11: 1 mg via INTRAVENOUS

## 2021-09-11 MED ORDER — VASOPRESSIN 20 UNITS/100 ML INFUSION FOR SHOCK
0.0000 [IU]/min | INTRAVENOUS | Status: DC
  Administered 2021-09-11: 09:00:00 0.03 [IU]/min via INTRAVENOUS
  Filled 2021-09-11 (×3): qty 100

## 2021-09-11 MED ORDER — ACETAMINOPHEN 500 MG PO TABS
ORAL_TABLET | ORAL | Status: AC
  Filled 2021-09-11: qty 2

## 2021-09-11 MED ORDER — DOCUSATE SODIUM 100 MG PO CAPS: 100.0000 mg | ORAL_CAPSULE | Freq: Two times a day (BID) | ORAL | Status: DC | PRN

## 2021-09-11 MED ORDER — NOREPINEPHRINE 4 MG/250ML-% IV SOLN
0.0000 ug/min | INTRAVENOUS | Status: DC
  Administered 2021-09-11: 20 ug/min via INTRAVENOUS
  Administered 2021-09-11: 10 ug/min via INTRAVENOUS
  Administered 2021-09-11: 20 ug/min via INTRAVENOUS
  Administered 2021-09-11: 13:00:00 26 ug/min via INTRAVENOUS
  Administered 2021-09-11: 03:00:00 2 ug/min via INTRAVENOUS
  Administered 2021-09-11: 15 ug/min via INTRAVENOUS
  Administered 2021-09-11: 08:00:00 10 ug/min via INTRAVENOUS
  Administered 2021-09-12: 12 ug/min via INTRAVENOUS
  Filled 2021-09-11 (×8): qty 250

## 2021-09-11 MED ORDER — PANTOPRAZOLE SODIUM 40 MG PO TBEC
40.0000 mg | DELAYED_RELEASE_TABLET | Freq: Every day | ORAL | Status: DC
  Administered 2021-09-11 – 2021-09-18 (×9): 40 mg via ORAL
  Filled 2021-09-11 (×9): qty 1

## 2021-09-11 MED ORDER — KETAMINE HCL 10 MG/ML IJ SOLN
INTRAMUSCULAR | Status: DC | PRN
  Administered 2021-09-11: 10 mg via INTRAVENOUS

## 2021-09-11 MED ORDER — SODIUM CHLORIDE 0.9% FLUSH: 10.0000 mL | INTRAVENOUS | Status: DC | PRN

## 2021-09-11 MED ORDER — ACETAMINOPHEN 325 MG PO TABS
ORAL_TABLET | ORAL | Status: AC
  Administered 2021-09-11: 650 mg via ORAL
  Filled 2021-09-11: qty 2

## 2021-09-11 MED ORDER — FENTANYL CITRATE PF 50 MCG/ML IJ SOSY
25.0000 ug | PREFILLED_SYRINGE | Freq: Once | INTRAMUSCULAR | Status: AC
  Administered 2021-09-11: 25 ug via INTRAVENOUS
  Filled 2021-09-11: qty 1

## 2021-09-11 SURGICAL SUPPLY — 30 items
BAG DRAIN CYSTO-URO LG1000N (MISCELLANEOUS) ×1 IMPLANT
BASKET ZERO TIP 1.9FR (BASKET) IMPLANT
BRUSH SCRUB EZ 1% IODOPHOR (MISCELLANEOUS) ×1 IMPLANT
CATH FOLEY 2W COUNCIL 5CC 16FR (CATHETERS) ×2 IMPLANT
CATH ROBINSON RED A/P 14FR (CATHETERS) ×2 IMPLANT
CATH URET FLEX-TIP 2 LUMEN 10F (CATHETERS) IMPLANT
CATH URETL OPEN END 6X70 (CATHETERS) IMPLANT
CNTNR SPEC 2.5X3XGRAD LEK (MISCELLANEOUS)
CONT SPEC 4OZ STER OR WHT (MISCELLANEOUS)
CONTAINER SPEC 2.5X3XGRAD LEK (MISCELLANEOUS) IMPLANT
DRAPE UTILITY 15X26 TOWEL STRL (DRAPES) ×3 IMPLANT
GAUZE 4X4 16PLY ~~LOC~~+RFID DBL (SPONGE) ×4 IMPLANT
GLOVE SURG UNDER POLY LF SZ7.5 (GLOVE) ×3 IMPLANT
GOWN STRL REUS W/ TWL LRG LVL3 (GOWN DISPOSABLE) ×1 IMPLANT
GOWN STRL REUS W/ TWL XL LVL3 (GOWN DISPOSABLE) ×1 IMPLANT
GOWN STRL REUS W/TWL LRG LVL3 (GOWN DISPOSABLE) ×2
GOWN STRL REUS W/TWL XL LVL3 (GOWN DISPOSABLE) ×2
GUIDEWIRE STR DUAL SENSOR (WIRE) ×5 IMPLANT
INFUSOR MANOMETER BAG 3000ML (MISCELLANEOUS) ×3 IMPLANT
IV NS IRRIG 3000ML ARTHROMATIC (IV SOLUTION) ×3 IMPLANT
KIT TURNOVER CYSTO (KITS) ×3 IMPLANT
PACK CYSTO AR (MISCELLANEOUS) ×3 IMPLANT
SET CYSTO W/LG BORE CLAMP LF (SET/KITS/TRAYS/PACK) ×3 IMPLANT
SHEATH URETERAL 12FRX35CM (MISCELLANEOUS) IMPLANT
STENT URET 6FRX24 CONTOUR (STENTS) IMPLANT
STENT URET 6FRX26 CONTOUR (STENTS) ×2 IMPLANT
SURGILUBE 2OZ TUBE FLIPTOP (MISCELLANEOUS) ×3 IMPLANT
TRACTIP FLEXIVA PULSE ID 200 (Laser) ×1 IMPLANT
VALVE UROSEAL ADJ ENDO (VALVE) ×2 IMPLANT
WATER STERILE IRR 500ML POUR (IV SOLUTION) ×3 IMPLANT

## 2021-09-11 NOTE — Anesthesia Preprocedure Evaluation (Addendum)
Anesthesia Evaluation  ?Patient identified by MRN, date of birth, ID band ?Patient confused ? ? ? ?Reviewed: ?Allergy & Precautions, NPO status , Patient's Chart, lab work & pertinent test results ? ?History of Anesthesia Complications ?Negative for: history of anesthetic complications ? ?Airway ?Mallampati: III ? ?TM Distance: >3 FB ?Neck ROM: full ? ? ? Dental ? ?(+) Poor Dentition, Chipped, Missing ?  ?Pulmonary ?neg shortness of breath, former smoker,  ?  ?Pulmonary exam normal ? ? ? ? ? ? ? Cardiovascular ?hypertension, +CHF and + DOE  ?Normal cardiovascular exam ? ? ?  ?Neuro/Psych ?negative neurological ROS ? negative psych ROS  ? GI/Hepatic ?negative GI ROS, Neg liver ROS,   ?Endo/Other  ?diabetes, Type 2 ? Renal/GU ?  ? ?  ?Musculoskeletal ? ? Abdominal ?  ?Peds ? Hematology ? ?(+) Blood dyscrasia, anemia ,   ?Anesthesia Other Findings ?Septic ? ?Past Medical History: ?No date: Diabetes mellitus without complication ([place]) ?09/08/1960: DVT (deep venous thrombosis) ([place]) ?No date: High cholesterol ?No date: Hx of blood clots ?No date: Hypertension ?09/21/2020: IDA (iron deficiency anemia) ? ?Past Surgical History: ?06/21/2021: COLONOSCOPY; N/A ?    Comment:  Procedure: COLONOSCOPY;  Surgeon: Toledo, Benay Pike, MD; ?             Location: [facility] ENDOSCOPY;  Service: Gastroenterology;   ?             Laterality: N/A; ?08/17/2021: ENTEROSCOPY; N/A ?    Comment:  Procedure: ENTEROSCOPY;  Surgeon: Lin Landsman,  ?             MD;  Location: [facility] ENDOSCOPY;  Service:  ?             Gastroenterology;  Laterality: N/A; ?06/21/2021: ESOPHAGOGASTRODUODENOSCOPY; N/A ?    Comment:  Procedure: ESOPHAGOGASTRODUODENOSCOPY (EGD);  Surgeon:  ?             Efrain Sella, MD;  Location: [facility] ENDOSCOPY;   ?             Service: Gastroenterology;  Laterality: N/A; ?08/17/2021: ESOPHAGOGASTRODUODENOSCOPY (EGD) WITH PROPOFOL; N/A ?    Comment:  Procedure: ESOPHAGOGASTRODUODENOSCOPY (EGD) WITH   ?             PROPOFOL;  Surgeon: Lin Landsman, MD;  Location:  ?             [facility] ENDOSCOPY;  Service: Gastroenterology;  Laterality:  ?             N/A; ?06/22/2021: GIVENS CAPSULE STUDY; N/A ?    Comment:  Procedure: GIVENS CAPSULE STUDY;  Surgeon: Adrian,  ?             Benay Pike, MD;  Location: [facility] ENDOSCOPY;  Service:  ?             Gastroenterology;  Laterality: N/A; ?08/18/2020: IVC FILTER INSERTION; N/A ?    Comment:  Procedure: IVC FILTER INSERTION;  Surgeon: Delana Meyer,  ?             Dolores Lory, MD;  Location: [place] [facility] LAB;  Service: ?             Cardiovascular;  Laterality: N/A; ?01/03/2021: PERIPHERAL VASCULAR THROMBECTOMY; Bilateral ?    Comment:  Procedure: PERIPHERAL VASCULAR THROMBECTOMY;  Surgeon:  ?             Algernon Huxley, MD;  Location: [place] [facility];   ?  Service: Cardiovascular;  Laterality: Bilateral; ?08/18/2020: PULMONARY THROMBECTOMY; N/A ?    Comment:  Procedure: PULMONARY THROMBECTOMY;  Surgeon: Delana Meyer,  ?             Dolores Lory, MD;  Location: [place] [facility];  Service: ?             Cardiovascular;  Laterality: N/A; ?No date: TUBAL LIGATION ?07/18/2021: VISCERAL ANGIOGRAPHY; N/A ?    Comment:  Procedure: VISCERAL ANGIOGRAPHY;  Surgeon: Algernon Huxley, ?             MD;  Location: [place] [facility];  Service:  ?             Cardiovascular;  Laterality: N/A; ? ?BMI   ? Body Mass Index: 43.82 kg/m?  ?  ? ? Reproductive/Obstetrics ?negative OB ROS ? ?  ? ? ? ? ? ? ? ? ? ? ? ? ? ?  ?  ? ? ? ? ? ? ? ? ?Anesthesia Physical ?Anesthesia Plan ? ?ASA: 4 and emergent ? ?Anesthesia Plan: General ETT  ? ?Post-op Pain Management:   ? ?Induction: Intravenous ? ?PONV Risk Score and Plan: Ondansetron, Dexamethasone, Midazolam and Treatment may vary due to age or medical condition ? ?Airway Management Planned: Oral ETT ? ?Additional Equipment:  ? ?Intra-op Plan:  ? ?Post-operative Plan: Extubation in OR and Possible Post-op intubation/ventilation ? ?Informed Consent: I  have reviewed the patients History and Physical, chart, labs and discussed the procedure including the risks, benefits and alternatives for the proposed anesthesia with the patient or authorized representative who has indicated his/her understanding and acceptance.  ? ? ? ?Dental Advisory Given ? ?Plan Discussed with: Anesthesiologist, CRNA and Surgeon ? ?Anesthesia Plan Comments: (History and phone consent the patients daughter Eula Flax at [phone]  ? ?Daughter consented for risks of anesthesia including but not limited to:  ?- adverse reactions to medications ?- damage to eyes, teeth, lips or other oral mucosa ?- nerve damage due to positioning  ?- sore throat or hoarseness ?- Damage to heart, brain, nerves, lungs, other parts of body or loss of life ? ?Dr. Andree Elk, reconfirmed the above with daughter and we will attempt to do this with MAC sedation to minimize HD risk. ?This plan was accepted by daughter.  JA ? ?Daughter voiced understanding.)  ? ? ? ? ? ?Anesthesia Quick Evaluation ? ?

## 2021-09-11 NOTE — Progress Notes (Signed)
Pharmacy Antibiotic Note ? ?Courtney Mack is a 66 y.o. female admitted on 09/11/2021 with sepsis.  Pharmacy has been consulted for vancomycin and cefepime dosing. ? ?-Updated current dosing to bedweight from  ED's verbal weight & updated CrCl calculation according to repeated Scr as well. ? ?Plan: ?Pt received Cefepime 2 gm, Vancomycin 2000 mg and Metronidazole x 1 each in ED ? ?Will adjust to Cefepime 2 gm IV q12h for Crcl 43.4 ml/min ? ?Will continue Vancomycin 1000 mg IV Q 24 hrs.  ?Goal AUC 400-550. Updated expected AUC: 520 ?SCr used: 1.26; Cmin 13.4 ; Vd= 0.5  (BMI 43.8, adjBW) ? ?  ? ?Height: '4\' 10"'$  (147.3 cm) ?Weight: 95.1 kg (209 lb 10.5 oz) ?IBW/kg (Calculated) : 40.9 ? ?Temp (24hrs), Avg:101.1 ?F (38.4 ?C), Min:99.1 ?F (37.3 ?C), Max:104 ?F (40 ?C) ? ?Recent Labs  ?Lab 09/11/21 ?0222 09/11/21 ?0848  ?WBC 9.3 11.9*  ?CREATININE 0.97 1.26*  ?LATICACIDVEN 2.5*  4.0* 4.9*  ? ?  ?Estimated Creatinine Clearance: 43.4 mL/min (A) (by C-G formula based on SCr of 1.26 mg/dL (H)).   ? ?No Known Allergies ? ?Antimicrobials this admission: ?Metronidazole x 1 3/7  ?Vancomycin 3/7 >>   ?Cefepime 3/7 >> ? ?Dose adjustments this admission: ?  ? ?Microbiology results: ?3/7 BCx: NG<12hrs ?3/7 UCx: pending  ?  Sputum:    ?3/7 MRSA PCR: pending ? ?Thank you for allowing pharmacy to be a part of this patient?s care. ? ?Shanon Brow Francyne Arreaga ?09/11/2021 12:53 PM ? ?

## 2021-09-11 NOTE — Evaluation (Signed)
Occupational Therapy Evaluation ?Patient Details ?Name: Courtney Mack ?MRN: [id number] ?DOB: December 07, 1955 ?Today's Date: 09/11/2021 ? ? ?History of Present Illness Courtney Mack is a 68yoF who [facility] with abd pain and in code sepsis.  PMH: PE/DVT xarelto, recurent GIB, HF, DM. Pt is status post discharge from hospital less than 1 month ago with presyncope, chest pain and hypoglycemic episode and was found to have a GI bleed while on Xarelto status post GI evaluation.  During that admission in February 2023 she was also COVID-19 positive.  ? ?Clinical Impression ?  ?Courtney Mack was seen for OT evaluation this date. Prior to hospital admission, pt was MOD I for household mobility using 4WW, assist for LB dressing/bathing. Pt lives with daughter in home c ramped entrance. Pt presents to acute OT demonstrating impaired ADL performance and functional mobility 2/2 decreased activity tolerance, poor insight into deficits, and functional strength/ROM/balance deficits. Pt currently requires MOD A sup>sit. MIN A dynamic  sitting EOB with single UE support. MAX A don/doff B socks seated EOB. MOD A sit<>stand to change wet chux pad, returned to supine with RN in room. Pt would benefit from skilled OT to address noted impairments and functional limitations (see below for any additional details). Upon hospital discharge, recommend STR to maximize pt safety and return to PLOF. ? ?Supine: BP 101/56, MAP 71, HR 105   ? ?Recommendations for follow up therapy are one component of a multi-disciplinary discharge planning process, led by the attending physician.  Recommendations may be updated based on patient status, additional functional criteria and insurance authorization.  ? ?Follow Up Recommendations ? Skilled nursing-short term rehab (<3 hours/day)  ?  ?Assistance Recommended at Discharge Frequent or constant Supervision/Assistance  ?Patient can return home with the following A lot of help with walking and/or  transfers;A lot of help with bathing/dressing/bathroom;Help with stairs or ramp for entrance ? ?  ?Functional Status Assessment ? Patient has had a recent decline in their functional status and demonstrates the ability to make significant improvements in function in a reasonable and predictable amount of time.  ?Equipment Recommendations ? Other (comment) (defer to next venue of care)  ?  ?Recommendations for Other Services   ? ? ?  ?Precautions / Restrictions Precautions ?Precautions: Fall ?Restrictions ?Weight Bearing Restrictions: No  ? ?  ? ?Mobility Bed Mobility ?Overal bed mobility: Needs Assistance ?Bed Mobility: Supine to Sit, Sit to Supine ?  ?  ?Supine to sit: Mod assist, HOB elevated ?Sit to supine: Max assist ?  ?  ?  ? ?Transfers ?Overall transfer level: Needs assistance ?Equipment used: 1 person hand held assist ?Transfers: Sit to/from Stand ?Sit to Stand: Mod assist, From elevated surface ?  ?  ?  ?  ?  ?  ?  ? ?  ?Balance Overall balance assessment: Needs assistance ?Sitting-balance support: Single extremity supported, Feet supported ?Sitting balance-Leahy Scale: Fair ?  ?  ?Standing balance support: Bilateral upper extremity supported ?Standing balance-Leahy Scale: Poor ?  ?  ?  ?  ?  ?  ?  ?  ?  ?  ?  ?  ?   ? ?ADL either performed or assessed with clinical judgement  ? ?ADL Overall ADL's : Needs assistance/impaired ?  ?  ?  ?  ?  ?  ?  ?  ?  ?  ?  ?  ?  ?  ?  ?  ?  ?  ?  ?General ADL Comments: MAX  A don/doff B socks at bed level. MIN A static sitting EOB with single UE support.  ? ? ? ? ?Pertinent Vitals/Pain Pain Assessment ?Pain Assessment: Faces ?Faces Pain Scale: Hurts a little bit ?Pain Location: R forearm at recent IV site ?Pain Descriptors / Indicators: Grimacing ?Pain Intervention(s): Limited activity within patient's tolerance, Repositioned  ? ? ? ?Hand Dominance Right ?  ?Extremity/Trunk Assessment Upper Extremity Assessment ?Upper Extremity Assessment: Generalized weakness ?  ?Lower  Extremity Assessment ?Lower Extremity Assessment: Generalized weakness ?  ?  ?  ?Communication Communication ?Communication: No difficulties ?  ?Cognition Arousal/Alertness: Lethargic, Awake/alert ?Behavior During Therapy: [facility] for tasks assessed/performed ?Overall Cognitive Status: Impaired/Different from baseline ?Area of Impairment: Orientation, Following commands, Safety/judgement ?  ?  ?  ?  ?  ?  ?  ?  ?Orientation Level: Disoriented to, Time, Situation ?  ?  ?Following Commands: Follows one step commands with increased time ?Safety/Judgement: Decreased awareness of deficits ?  ?  ?  ?  ?  ?General Comments  Supine: BP 101/56, MAP 71, HR 105 ? ?  ? ?Home Living Family/patient expects to be discharged to:: Private residence ?Living Arrangements: Children ?Available Help at Discharge: Family ?Type of Home: House ?Home Access: Ramped entrance ?  ?  ?Home Layout: One level ?  ?  ?Bathroom Shower/Tub: Walk-in shower ?  ?Bathroom Toilet: Handicapped height ?  ?  ?Home Equipment: Rollator (4 wheels) ?  ?Additional Comments: has lift recliner ?  ? ?  ?Prior Functioning/Environment Prior Level of Function : Independent/Modified Independent ?  ?  ?  ?  ?  ?  ?Mobility Comments: Pt reports household ambulator using 4WW ?ADLs Comments: assist for LB dressing/bathing. ?  ? ?  ?  ?OT Problem List: Decreased strength;Decreased range of motion;Impaired balance (sitting and/or standing);Decreased activity tolerance;Decreased safety awareness ?  ?   ?OT Treatment/Interventions: Self-care/ADL training;Therapeutic exercise;Energy conservation;DME and/or AE instruction;Therapeutic activities;Patient/family education;Balance training  ?  ?OT Goals(Current goals can be found in the care plan section) Acute Rehab OT Goals ?Patient Stated Goal: to feel better ?OT Goal Formulation: With patient ?Time For Goal Achievement: 09/25/21 ?Potential to Achieve Goals: Good ?ADL Goals ?Pt Will Perform Grooming: standing;with min guard assist ?Pt  Will Transfer to Toilet: with min guard assist;ambulating;bedside commode ?Pt Will Perform Toileting - Clothing Manipulation and hygiene: with modified independence;sitting/lateral leans  ?OT Frequency: Min 2X/week ?  ? ?Co-evaluation   ?  ?  ?  ?  ? ?  ?AM-PAC OT "6 Clicks" Daily Activity     ?Outcome Measure Help from another person eating meals?: None ?Help from another person taking care of personal grooming?: A Little ?Help from another person toileting, which includes using toliet, bedpan, or urinal?: A Lot ?Help from another person bathing (including washing, rinsing, drying)?: A Lot ?Help from another person to put on and taking off regular upper body clothing?: A Little ?Help from another person to put on and taking off regular lower body clothing?: A Lot ?6 Click Score: 16 ?  ?End of Session Equipment Utilized During Treatment: Oxygen ?Nurse Communication: Mobility status ? ?Activity Tolerance: Patient tolerated treatment well ?Patient left: in bed;with call bell/phone within reach;with nursing/sitter in room ? ?OT Visit Diagnosis: Other abnormalities of gait and mobility (R26.89);Muscle weakness (generalized) (M62.81)  ?              ?Time: 5188-4166 ?OT Time Calculation (min): 17 min ?Charges:  OT General Charges ?$OT Visit: 1 Visit ?OT Evaluation ?$OT Eval Moderate  Complexity: 1 Mod ? ?Dessie Coma, M.S. OTR/L  ?09/11/21, 2:54 PM  ?ascom [phone] ? ?

## 2021-09-11 NOTE — Anesthesia Procedure Notes (Signed)
Date/Time: 09/11/2021 3:32 PM ?Performed by: Kerri Perches, CRNA ?Pre-anesthesia Checklist: Patient identified, Emergency Drugs available, Suction available, Patient being monitored and Timeout performed ?Oxygen Delivery Method: Simple face mask ? ? ? ? ?

## 2021-09-11 NOTE — TOC Initial Note (Signed)
Transition of Care (TOC) - Initial/Assessment Note  ? ? ?Patient Details  ?Name: Courtney Mack ?MRN: [id number] ?Date of Birth: 1955/07/29 ? ?Transition of Care (TOC) CM/SW Contact:    ?Shelbie Hutching, RN ?Phone Number: ?09/11/2021, 4:08 PM ? ?Clinical Narrative:                 ? ?Patient is from home lives with her daughters admitted to the hospital with septic shock, discharged end of February with symptomatic anemia.   ? ?[facility] with [facility] for [facility]- Corene Cornea with [facility] is aware of admission.  RN and OT can be added to [facility] orders at discharge.  Patient has all needed DME at home.   ? ?TOC will follow- patient currently in the ICU.   ? ?Expected Discharge Plan: [place] ?Barriers to Discharge: Continued Medical Work up ? ? ?Patient Goals and CMS Choice ?  ?CMS Medicare.gov Compare Post Acute Care list provided to:: Patient ?Choice offered to / list presented to : Patient ? ?Expected Discharge Plan and Services ?Expected Discharge Plan: [place] ?  ?Discharge Planning Services: CM Consult ?Post Acute Care Choice: Home Health ?Living arrangements for the past 2 months: [place] ?                ?DME Arranged: N/A ?DME Agency: NA ?  ?  ?  ?HH Arranged: PT, OT, RN ?[place] Agency: [place] ([place]) ?Date HH Agency Contacted: 09/11/21 ?Time [place]: [phone] ?Representative spoke with at [place]: Floydene Flock ? ?Prior Living Arrangements/Services ?Living arrangements for the past 2 months: [place] ?Lives with:: Adult Children ?Patient language and need for interpreter reviewed:: Yes ?Do you feel safe going back to the place where you live?: Yes      ?Need for Family Participation in Patient Care: Yes (Comment) ?Care giver support system in place?: Yes (comment) (daughters) ?Current home services: DME (RW, bedside commode, shower chair) ?Criminal Activity/Legal Involvement Pertinent to Current  Situation/Hospitalization: No - Comment as needed ? ?Activities of Daily Living ?Home Assistive Devices/Equipment: Kasandra Knudsen (specify quad or straight), Eyeglasses ?ADL Screening (condition at time of admission) ?Patient's cognitive ability adequate to safely complete daily activities?: Yes ?Is the patient deaf or have difficulty hearing?: No ?Does the patient have difficulty seeing, even when wearing glasses/contacts?: No ?Does the patient have difficulty concentrating, remembering, or making decisions?: No ?Patient able to express need for assistance with ADLs?: Yes ?Does the patient have difficulty dressing or bathing?: Yes ?Independently performs ADLs?: No ?Communication: Independent ?Dressing (OT): Needs assistance ?Is this a change from baseline?: Change from baseline, expected to last <3days ?Grooming: Needs assistance ?Is this a change from baseline?: Pre-admission baseline ?Feeding: Independent with device (comment) ?Bathing: Needs assistance ?Is this a change from baseline?: Pre-admission baseline ?Toileting: Dependent ?Is this a change from baseline?: Pre-admission baseline ?In/Out Bed: Needs assistance ?Is this a change from baseline?: Pre-admission baseline ?Walks in Home: Needs assistance ?Is this a change from baseline?: Pre-admission baseline ?Does the patient have difficulty walking or climbing stairs?: Yes ?Weakness of Legs: Both ?Weakness of Arms/Hands: Both ? ?Permission Sought/Granted ?Permission sought to share information with : Case Manager, Family Supports, Other (comment) ?Permission granted to share information with : Yes, Verbal Permission Granted ? Share Information with NAME: Doloris Hall ? Permission granted to share info w AGENCY: [facility] ? Permission granted to share info w Relationship: daughter ? Permission granted to share  info w Contact Information: [phone] ? ?Emotional Assessment ?  ?  ?  ?  ?Alcohol / Substance Use: Not Applicable ?Psych Involvement: No (comment) ? ?Admission  diagnosis:  COPD exacerbation ([place]) [J44.1] ?Encounter for central line placement [Z45.2] ?Septic shock ([place]) [A41.9, R65.21] ?Patient Active Problem List  ? Diagnosis Date Noted  ? Septic shock ([place]) 09/11/2021  ? COVID-19   ? [place] 07/18/2021  ? Acute on chronic blood loss anemia 07/18/2021  ? Pressure injury of skin 06/21/2021  ? Symptomatic anemia 06/20/2021  ? Sepsis ([place]) 06/20/2021  ? HLD (hyperlipidemia) 06/20/2021  ? Fall at home, initial encounter 06/20/2021  ? Cough 06/20/2021  ? Chronic diastolic CHF (congestive heart failure) ([place]) 06/20/2021  ? GI bleeding 06/20/2021  ? Cellulitis of left lower leg 06/20/2021  ? Acute bilateral deep vein thrombosis (DVT) of femoral veins ([place]) 01/02/2021  ? DOE (dyspnea on exertion) 11/10/2020  ? Risk factors for obstructive sleep apnea 11/10/2020  ? Uterine cancer ([place]) 11/08/2020  ? IDA (iron deficiency anemia) 09/21/2020  ? Endometrial cancer ([place]) 09/13/2020  ? Acute blood loss anemia 09/06/2020  ? Vaginal bleeding 09/06/2020  ? DVT (deep venous thrombosis) ([place]) 09/06/2020  ? Acute massive pulmonary embolism ([place]) 08/25/2020  ? Anemia, unspecified 08/24/2020  ? Morbidly obese ([place]) 08/23/2020  ? Essential hypertension 08/23/2020  ? Diabetes mellitus without complication ([place]) 69/48/5462  ? Dyslipidemia 08/23/2020  ? Acute respiratory failure with hypoxia ([place])   ? Pulmonary embolism ([place]) 08/18/2020  ? Vaginal bleeding, abnormal 08/18/2020  ? Obesity, morbid, BMI 50 or higher ([place]) 08/18/2020  ? ?PCP:  [facility] [place] ?Pharmacy:   ?[id number] [address] ?[address] ?[place] [address] ?Phone: [phone] Fax: [phone] ? ? ? ? ?Social Determinants of Health (SDOH) Interventions ?  ? ?Readmission Risk Interventions ?Readmission Risk Prevention Plan 09/11/2021 08/16/2021 07/19/2021  ?Transportation Screening Complete Complete Complete  ?PCP or Specialist  Appt within 3-5 Days - - -  ?[place] or [place] - - Complete  ?Social Work Consult for [place] Planning/Counseling - - Complete  ?Palliative Care Screening - - Not Applicable  ?Medication Review Press photographer) Complete Complete Complete  ?PCP or Specialist appointment within 3-5 days of discharge Complete Complete -  ?[place] or Home Care Consult Complete Complete -  ?SW Recovery Care/Counseling Consult Complete Complete -  ?Palliative Care Screening Not Applicable Not Applicable -  ?[place] Not Applicable Not Applicable -  ?Some recent data might be hidden  ? ? ? ?

## 2021-09-11 NOTE — ED Notes (Signed)
Pt c/o N/V/D with abd pain for the past couple of days, pt mucous membranes are dry, tachypnea noted , pt is hypotensive and tachycardic, hospitalist notified or fever this morning and request for medication sent in secure chat, waiting for a response at this time. Levo increased from 63mg to 10. Pt is currently on 2L [place]. Female external catheter in place. Pt is alert with periods of confusion.  ?

## 2021-09-11 NOTE — Plan of Care (Signed)

## 2021-09-11 NOTE — H&P (Signed)
CRITICAL CARE     Name: Courtney Mack MRN: [id number] DOB: [birth year]     LOS: 0   SUBJECTIVE FINDINGS & SIGNIFICANT EVENTS    Patient description:   This is a pleasant 66 year old female with history of chronic bilateral severe lymphedema, both PE and DVT approximately a year ago, diastolic heart failure, recurrent respiratory failure with fluid shift and pulmonary edema history of uterine cancer chronic anemia, morbid obesity, dyslipidemia, status post discharge from hospital less than 1 month ago with presyncope, chest pain and hypoglycemic episode and was found to have a GI bleed while on Xarelto status post GI evaluation.  During that admission in February 2023 she was also COVID-19 positive.  She came to the emergency room due to feeling unwell x2 days with flulike illness including fevers abdominal pain nausea vomiting and shortness of breath.  She was found to have Tmax of 104 with circulatory shock critically ill requiring admission to medical intensive care unit.  She is conversive and on 4 L/min nasal cannula was able to reach normoxia there is mild encephalopathy during interview.  Empiric diagnosis on present on admission is septic shock due to intra-abdominal infection likely urinary system due to abnormal CT abdomen with left-sided hydronephrosis and hydroureter which is new from previous study additional etiology includes pneumonia due to abnormal chest x-ray with bilateral airspace opacification reviewed dependently by me.  Lab work is consistent with sepsis including elevated lactate leukocytosis coupled with vitals showing tachycardia and fever.  Goals of care discussion performed with patient and confirmed with nurse Thereasa Solo RN patient remains full code and wishes to have fully aggressive scope of  care.  Lines/tubes :   Microbiology/Sepsis markers: Results for orders placed or performed during the hospital encounter of 09/11/21  Resp Panel by RT-PCR (Flu A&B, Covid) Nasopharyngeal Swab     Status: None   Collection Time: 09/11/21  2:22 AM   Specimen: Nasopharyngeal Swab; Nasopharyngeal(NP) swabs in vial transport medium  Result Value Ref Range Status   SARS Coronavirus 2 by RT PCR NEGATIVE NEGATIVE Final    Comment: (NOTE) SARS-CoV-2 target nucleic acids are NOT DETECTED.  The SARS-CoV-2 RNA is generally detectable in upper respiratory specimens during the acute phase of infection. The lowest concentration of SARS-CoV-2 viral copies this assay can detect is 138 copies/mL. A negative result does not preclude SARS-Cov-2 infection and should not be used as the sole basis for treatment or other patient management decisions. A negative result may occur with  improper specimen collection/handling, submission of specimen other than nasopharyngeal swab, presence of viral mutation(s) within the areas targeted by this assay, and inadequate number of viral copies(<138 copies/mL). A negative result must be combined with clinical observations, patient history, and epidemiological information. The expected result is Negative.  Fact Sheet for Patients:  EntrepreneurPulse.com.au  Fact Sheet for Healthcare Providers:  IncredibleEmployment.be  This test is no t yet approved or cleared by the [place] FDA and  has been authorized for detection and/or diagnosis of SARS-CoV-2 by FDA under an Emergency Use Authorization (EUA). This EUA will remain  in effect (meaning this test can be used) for the duration of the COVID-19 declaration under Section 564(b)(1) of the Act, 21 U.S.C.section 360bbb-3(b)(1), unless the authorization is terminated  or revoked sooner.       Influenza A by PCR NEGATIVE NEGATIVE Final   Influenza B by PCR NEGATIVE NEGATIVE  Final    Comment: (NOTE) The Xpert Xpress SARS-CoV-2/FLU/RSV plus assay is  intended as an aid in the diagnosis of influenza from Nasopharyngeal swab specimens and should not be used as a sole basis for treatment. Nasal washings and aspirates are unacceptable for Xpert Xpress SARS-CoV-2/FLU/RSV testing.  Fact Sheet for Patients: EntrepreneurPulse.com.au  Fact Sheet for Healthcare Providers: IncredibleEmployment.be  This test is not yet approved or cleared by the [place] FDA and has been authorized for detection and/or diagnosis of SARS-CoV-2 by FDA under an Emergency Use Authorization (EUA). This EUA will remain in effect (meaning this test can be used) for the duration of the COVID-19 declaration under Section 564(b)(1) of the Act, 21 U.S.C. section 360bbb-3(b)(1), unless the authorization is terminated or revoked.  Performed at [facility], [address] [place], [place] [address]   Blood Culture (routine x 2)     Status: None (Preliminary result)   Collection Time: 09/11/21  2:23 AM   Specimen: BLOOD  Result Value Ref Range Status   Specimen Description BLOOD LEFT AC  Final   Special Requests   Final    BOTTLES DRAWN AEROBIC AND ANAEROBIC Blood Culture results may not be optimal due to an excessive volume of blood received in culture bottles   Culture   Final    NO GROWTH < 12 HOURS Performed at [facility], [address] [place], [place] [address]    Report Status PENDING  Incomplete  Blood Culture (routine x 2)     Status: None (Preliminary result)   Collection Time: 09/11/21  2:23 AM   Specimen: BLOOD  Result Value Ref Range Status   Specimen Description BLOOD RIGHT FA  Final   Special Requests   Final    BOTTLES DRAWN AEROBIC AND ANAEROBIC Blood Culture results may not be optimal due to an inadequate volume of blood received in culture bottles   Culture   Final    NO GROWTH < 12 HOURS Performed at  [facility], [address], [place], [place] [address]    Report Status PENDING  Incomplete    Anti-infectives:  Anti-infectives (From admission, onward)    Start     Dose/Rate Route Frequency Ordered Stop   09/11/21 0215  ceFEPIme (MAXIPIME) 2 g in sodium chloride 0.9 % 100 mL IVPB        2 g 200 mL/hr over 30 Minutes Intravenous  Once 09/11/21 0208 09/11/21 0338   09/11/21 0215  metroNIDAZOLE (FLAGYL) IVPB 500 mg        500 mg 100 mL/hr over 60 Minutes Intravenous  Once 09/11/21 0208 09/11/21 0255   09/11/21 0215  vancomycin (VANCOCIN) IVPB 1000 mg/200 mL premix  Status:  Discontinued        1,000 mg 200 mL/hr over 60 Minutes Intravenous  Once 09/11/21 0208 09/11/21 0211   09/11/21 0215  vancomycin (VANCOREADY) IVPB 2000 mg/400 mL        2,000 mg 200 mL/hr over 120 Minutes Intravenous  Once 09/11/21 0211 09/11/21 0510        Consults: Treatment Team:  Abbie Sons, MD     Tests / Events: -CT abdomen and pelvis reviewed by me independently  -Chest x-ray with bilateral airspace opacification with clear costophrenic angle suggestive of pneumonia  PAST MEDICAL HISTORY   Past Medical History:  Diagnosis Date   Diabetes mellitus without complication ([place])    DVT (deep venous thrombosis) ([place]) 09/06/2020   High cholesterol    Hx of blood clots    Hypertension    IDA (iron deficiency  anemia) 09/21/2020     SURGICAL HISTORY   Past Surgical History:  Procedure Laterality Date   COLONOSCOPY N/A 06/21/2021   Procedure: COLONOSCOPY;  Surgeon: Toledo, Benay Pike, MD;  Location: [facility] ENDOSCOPY;  Service: Gastroenterology;  Laterality: N/A;   ENTEROSCOPY N/A 08/17/2021   Procedure: ENTEROSCOPY;  Surgeon: Lin Landsman, MD;  Location: [facility] ENDOSCOPY;  Service: Gastroenterology;  Laterality: N/A;   ESOPHAGOGASTRODUODENOSCOPY N/A 06/21/2021   Procedure: ESOPHAGOGASTRODUODENOSCOPY (EGD);  Surgeon: Toledo, Benay Pike, MD;  Location: [facility] ENDOSCOPY;  Service:  Gastroenterology;  Laterality: N/A;   ESOPHAGOGASTRODUODENOSCOPY (EGD) WITH PROPOFOL N/A 08/17/2021   Procedure: ESOPHAGOGASTRODUODENOSCOPY (EGD) WITH PROPOFOL;  Surgeon: Lin Landsman, MD;  Location: [facility] ENDOSCOPY;  Service: Gastroenterology;  Laterality: N/A;   GIVENS CAPSULE STUDY N/A 06/22/2021   Procedure: GIVENS CAPSULE STUDY;  Surgeon: Toledo, Benay Pike, MD;  Location: [facility] ENDOSCOPY;  Service: Gastroenterology;  Laterality: N/A;   IVC FILTER INSERTION N/A 08/18/2020   Procedure: IVC FILTER INSERTION;  Surgeon: Katha Cabal, MD;  Location: [place] CV LAB;  Service: Cardiovascular;  Laterality: N/A;   PERIPHERAL VASCULAR THROMBECTOMY Bilateral 01/03/2021   Procedure: PERIPHERAL VASCULAR THROMBECTOMY;  Surgeon: Algernon Huxley, MD;  Location: [place] CV LAB;  Service: Cardiovascular;  Laterality: Bilateral;   PULMONARY THROMBECTOMY N/A 08/18/2020   Procedure: PULMONARY THROMBECTOMY;  Surgeon: Katha Cabal, MD;  Location: [place] CV LAB;  Service: Cardiovascular;  Laterality: N/A;   TUBAL LIGATION     VISCERAL ANGIOGRAPHY N/A 07/18/2021   Procedure: VISCERAL ANGIOGRAPHY;  Surgeon: Algernon Huxley, MD;  Location: [place] CV LAB;  Service: Cardiovascular;  Laterality: N/A;     FAMILY HISTORY   Family History  Problem Relation Age of Onset   Hypertension Mother    Diabetes Mother    Diabetes Father    Hypertension Father    High Cholesterol Father    Congestive Heart Failure Father    Breast cancer Cousin      SOCIAL HISTORY   Social History   Tobacco Use   Smoking status: Former    Packs/day: 0.25    Years: 0.50    Pack years: 0.13    Types: Cigarettes    Quit date: 09/21/1980    Years since quitting: 41.0   Smokeless tobacco: Never   Tobacco comments:    smoked 2-4 cigarettes for about 2-3 weeks   Vaping Use   Vaping Use: Never used  Substance Use Topics   Alcohol use: Not Currently   Drug use: Not Currently     MEDICATIONS    Current Medication:  Current Facility-Administered Medications:    acetaminophen (TYLENOL) 500 MG tablet, , , ,    acetaminophen (TYLENOL) tablet 650 mg, 650 mg, Oral, Q6H PRN, Darel Hong D, NP   docusate sodium (COLACE) capsule 100 mg, 100 mg, Oral, BID PRN, Tukov-Yual, Magdalene S, NP   heparin injection 5,000 Units, 5,000 Units, Subcutaneous, Q8H, Tukov-Yual, Magdalene S, NP, 5,000 Units at 09/11/21 0820   hydrocortisone sodium succinate (SOLU-CORTEF) 100 MG injection 100 mg, 100 mg, Intravenous, Once, Darel Hong D, NP   ipratropium (ATROVENT) nebulizer solution 0.5 mg, 0.5 mg, Nebulization, Q6H, Bradly Bienenstock, NP   lactated ringers infusion, , Intravenous, Continuous, Rada Hay, MD, Last Rate: 150 mL/hr at 09/11/21 0815, New Bag at 09/11/21 0815   levalbuterol (XOPENEX) nebulizer solution 1.25 mg, 1.25 mg, Nebulization, Q6H, Darel Hong D, NP   norepinephrine (LEVOPHED) '4mg'$  in 241m (0.016 mg/mL) premix infusion, 0-40 mcg/min, Intravenous,  Continuous, Rada Hay, MD, Last Rate: 75 mL/hr at 09/11/21 0814, 20 mcg/min at 09/11/21 0814   pantoprazole (PROTONIX) EC tablet 40 mg, 40 mg, Oral, Daily, Darel Hong D, NP   polyethylene glycol (MIRALAX / GLYCOLAX) packet 17 g, 17 g, Oral, Daily PRN, Tukov-Yual, Magdalene S, NP   vasopressin (PITRESSIN) 20 Units in sodium chloride 0.9 % 100 mL infusion-*FOR SHOCK*, 0-0.03 Units/min, Intravenous, Continuous, Darel Hong D, NP  Current Outpatient Medications:    DULERA 100-5 MCG/ACT AERO, Inhale 2 puffs into the lungs 2 (two) times daily., Disp: , Rfl:    Ferrous Sulfate (IRON) 325 (65 Fe) MG TABS, Take 325 mg by mouth every other day., Disp: , Rfl:    loperamide (IMODIUM) 2 MG capsule, Take 1 capsule (2 mg total) by mouth as needed for diarrhea or loose stools., Disp: 30 capsule, Rfl: 0   losartan (COZAAR) 25 MG tablet, Take 1 tablet (25 mg total) by mouth daily., Disp: 15 tablet, Rfl: 0   Multiple Vitamin  (MULTIVITAMIN WITH MINERALS) TABS tablet, Take 1 tablet by mouth daily., Disp: , Rfl:    pantoprazole (PROTONIX) 40 MG tablet, Take 1 tablet (40 mg total) by mouth daily., Disp: 30 tablet, Rfl: 0   rivaroxaban (XARELTO) 20 MG TABS tablet, Take 1 tablet (20 mg total) by mouth daily with supper., Disp: 30 tablet, Rfl: 1   rosuvastatin (CRESTOR) 10 MG tablet, Take 1 tablet (10 mg total) by mouth daily., Disp: 15 tablet, Rfl: 0   simethicone (MYLICON) 80 MG chewable tablet, Chew 1 tablet (80 mg total) by mouth 4 (four) times daily., Disp: 30 tablet, Rfl: 0   vitamin B-12 1000 MCG tablet, Take 1 tablet (1,000 mcg total) by mouth daily., Disp: 30 tablet, Rfl: 0    ALLERGIES   Patient has no known allergies.    REVIEW OF SYSTEMS     10 point review of systems conducted and is negative except for abdominal pain, lower extremity weakness, dryness of the mouth, shortness of breath.  PHYSICAL EXAMINATION   Vital Signs: Temp:  [99.1 F (37.3 C)-104 F (40 C)] 102.8 F (39.3 C) (03/07 0742) Pulse Rate:  [117-154] 138 (03/07 0815) Resp:  [22-44] 36 (03/07 0815) BP: (62-116)/(18-98) 73/30 (03/07 0815) SpO2:  [95 %-100 %] 97 % (03/07 0815)  GENERAL: Age-appropriate mild distress due to acutely critical ill status HEAD: Normocephalic, atraumatic.  EYES: Presbyopia with round, reactive to light.  No scleral icterus.  MOUTH: Moist mucosal membrane. NECK: Supple. No thyromegaly. No nodules. No JVD.  PULMONARY: Mild rhonchi bilaterally  CARDIOVASCULAR: S1 and S2. Regular rate and rhythm. No murmurs, rubs, or gallops.  GASTROINTESTINAL: Soft, nontender, non-distended. No masses. Positive bowel sounds. No hepatosplenomegaly.  MUSCULOSKELETAL: No swelling, clubbing, or edema.  NEUROLOGIC: Mild distress due to acute illness SKIN:intact,warm,dry   PERTINENT DATA     Infusions:  lactated ringers 150 mL/hr at 09/11/21 0815   norepinephrine (LEVOPHED) Adult infusion 20 mcg/min (09/11/21 0814)    vasopressin     Scheduled Medications:  acetaminophen       heparin  5,000 Units Subcutaneous Q8H   hydrocortisone sod succinate (SOLU-CORTEF) inj  100 mg Intravenous Once   ipratropium  0.5 mg Nebulization Q6H   levalbuterol  1.25 mg Nebulization Q6H   pantoprazole  40 mg Oral Daily   PRN Medications: acetaminophen, docusate sodium, polyethylene glycol Hemodynamic parameters:   Intake/Output: 03/06 0701 - 03/07 0700 In: 2000 [IV [id number]] Out: -   Ventilator  Settings:  LAB RESULTS:  Basic Metabolic Panel: Recent Labs  Lab 09/11/21 0222  NA 139  K 3.9  CL 112*  CO2 19*  GLUCOSE 103*  BUN 20  CREATININE 0.97  CALCIUM 7.7*   Liver Function Tests: Recent Labs  Lab 09/11/21 0222  AST 46*  ALT 11  ALKPHOS 38  BILITOT 1.8*  PROT 5.4*  ALBUMIN 2.4*   No results for input(s): LIPASE, AMYLASE in the last 168 hours. No results for input(s): AMMONIA in the last 168 hours. CBC: Recent Labs  Lab 09/11/21 0222  WBC 9.3  NEUTROABS 8.4*  HGB 8.8*  HCT 30.0*  MCV 88.8  PLT 126*   Cardiac Enzymes: No results for input(s): CKTOTAL, CKMB, CKMBINDEX, TROPONINI in the last 168 hours. BNP: Invalid input(s): POCBNP CBG: No results for input(s): GLUCAP in the last 168 hours.     IMAGING RESULTS:  Imaging: CT CHEST ABDOMEN PELVIS W CONTRAST  Result Date: 09/11/2021 CLINICAL DATA:  Fever with nausea, vomiting and diarrhea. EXAM: CT CHEST, ABDOMEN, AND PELVIS WITH CONTRAST TECHNIQUE: Multidetector CT imaging of the chest, abdomen and pelvis was performed following the standard protocol during bolus administration of intravenous contrast. RADIATION DOSE REDUCTION: This exam was performed according to the departmental dose-optimization program which includes automated exposure control, adjustment of the mA and/or kV according to patient size and/or use of iterative reconstruction technique. CONTRAST:  180m OMNIPAQUE IOHEXOL 300 MG/ML  SOLN COMPARISON:   August 14, 2021 FINDINGS: CT CHEST FINDINGS Cardiovascular: There is mild to moderate severity calcification of the aortic arch, without evidence of aortic aneurysm or dissection. Normal heart size. A very small amount of pericardial fluid is noted. Mediastinum/Nodes: No enlarged mediastinal, hilar, or axillary lymph nodes. Thyroid gland, trachea, and esophagus demonstrate no significant findings. Lungs/Pleura: Mild atelectasis is seen along the anteromedial aspect of the left upper lobe and posterior aspect of the left lung base. There is no evidence of a pleural effusion or pneumothorax. Musculoskeletal: Degenerative changes are seen throughout the thoracic spine CT ABDOMEN PELVIS FINDINGS Hepatobiliary: No focal liver abnormality is seen. No gallstones, gallbladder wall thickening, or biliary dilatation. Pancreas: Unremarkable. No pancreatic ductal dilatation or surrounding inflammatory changes. Spleen: Normal in size without focal abnormality. Adrenals/Urinary Tract: Adrenal glands are unremarkable. Kidneys are normal in size, without focal lesions. Mild asymmetric renal cortical enhancement is seen on the left. There is mild to moderate severity left-sided hydronephrosis and hydroureter. This is new when compared to the prior study. A 2 mm left ureteral calculus is noted within the distal left ureter (axial CT image 107, CT series 2), proximal to the left UVJ. Mild inflammatory fat stranding is seen surrounding the urinary bladder which is poorly distended and subsequently limited in evaluation. Stomach/Bowel: Stomach is within normal limits. Appendix appears normal. No evidence of bowel wall thickening, distention, or inflammatory changes. Vascular/Lymphatic: Aortic atherosclerosis with marked severity calcification along the origin of the bilateral renal arteries. An inferior vena cava filter is in place. A stable appearing cluster of para-aortic lymph nodes is seen just below the level of the left renal  artery. The largest measures approximately 1.4 cm. Reproductive: Uterus and bilateral adnexa are unremarkable. Other: Marked severity anasarca is seen along the lateral aspects of the abdominal and pelvic walls. No abdominopelvic ascites. Musculoskeletal: Degenerative changes seen throughout the lumbar spine. IMPRESSION: 1. 2 mm distal left ureteral calculus with mild to moderate severity left-sided hydronephrosis and hydroureter. 2. Mild left upper lobe and left basilar atelectatic changes. 3. Stable marked  severity anasarca. Aortic Atherosclerosis (ICD10-I70.0). Electronically Signed   By: Virgina Norfolk M.D.   On: 09/11/2021 04:18   DG Chest Port 1 View  Result Date: 09/11/2021 CLINICAL DATA:  Shortness of breath. EXAM: PORTABLE CHEST 1 VIEW COMPARISON:  August 14, 2021 FINDINGS: Low lung volumes are seen with subsequent crowding of the bronchovascular lung markings. Mild prominence of the perihilar pulmonary vasculature is also seen. There is no evidence of focal consolidation, pleural effusion or pneumothorax. The heart size and mediastinal contours are within normal limits. Degenerative changes are seen throughout the thoracic spine. IMPRESSION: Low lung volumes with mild pulmonary vascular congestion. Electronically Signed   By: Virgina Norfolk M.D.   On: 09/11/2021 03:19   '@PROBHOSP'$ @ CT CHEST ABDOMEN PELVIS W CONTRAST  Result Date: 09/11/2021 CLINICAL DATA:  Fever with nausea, vomiting and diarrhea. EXAM: CT CHEST, ABDOMEN, AND PELVIS WITH CONTRAST TECHNIQUE: Multidetector CT imaging of the chest, abdomen and pelvis was performed following the standard protocol during bolus administration of intravenous contrast. RADIATION DOSE REDUCTION: This exam was performed according to the departmental dose-optimization program which includes automated exposure control, adjustment of the mA and/or kV according to patient size and/or use of iterative reconstruction technique. CONTRAST:  116m OMNIPAQUE  IOHEXOL 300 MG/ML  SOLN COMPARISON:  August 14, 2021 FINDINGS: CT CHEST FINDINGS Cardiovascular: There is mild to moderate severity calcification of the aortic arch, without evidence of aortic aneurysm or dissection. Normal heart size. A very small amount of pericardial fluid is noted. Mediastinum/Nodes: No enlarged mediastinal, hilar, or axillary lymph nodes. Thyroid gland, trachea, and esophagus demonstrate no significant findings. Lungs/Pleura: Mild atelectasis is seen along the anteromedial aspect of the left upper lobe and posterior aspect of the left lung base. There is no evidence of a pleural effusion or pneumothorax. Musculoskeletal: Degenerative changes are seen throughout the thoracic spine CT ABDOMEN PELVIS FINDINGS Hepatobiliary: No focal liver abnormality is seen. No gallstones, gallbladder wall thickening, or biliary dilatation. Pancreas: Unremarkable. No pancreatic ductal dilatation or surrounding inflammatory changes. Spleen: Normal in size without focal abnormality. Adrenals/Urinary Tract: Adrenal glands are unremarkable. Kidneys are normal in size, without focal lesions. Mild asymmetric renal cortical enhancement is seen on the left. There is mild to moderate severity left-sided hydronephrosis and hydroureter. This is new when compared to the prior study. A 2 mm left ureteral calculus is noted within the distal left ureter (axial CT image 107, CT series 2), proximal to the left UVJ. Mild inflammatory fat stranding is seen surrounding the urinary bladder which is poorly distended and subsequently limited in evaluation. Stomach/Bowel: Stomach is within normal limits. Appendix appears normal. No evidence of bowel wall thickening, distention, or inflammatory changes. Vascular/Lymphatic: Aortic atherosclerosis with marked severity calcification along the origin of the bilateral renal arteries. An inferior vena cava filter is in place. A stable appearing cluster of para-aortic lymph nodes is seen just  below the level of the left renal artery. The largest measures approximately 1.4 cm. Reproductive: Uterus and bilateral adnexa are unremarkable. Other: Marked severity anasarca is seen along the lateral aspects of the abdominal and pelvic walls. No abdominopelvic ascites. Musculoskeletal: Degenerative changes seen throughout the lumbar spine. IMPRESSION: 1. 2 mm distal left ureteral calculus with mild to moderate severity left-sided hydronephrosis and hydroureter. 2. Mild left upper lobe and left basilar atelectatic changes. 3. Stable marked severity anasarca. Aortic Atherosclerosis (ICD10-I70.0). Electronically Signed   By: TVirgina NorfolkM.D.   On: 09/11/2021 04:18   [email or web address]  Result  Date: 09/11/2021 CLINICAL DATA:  Shortness of breath. EXAM: PORTABLE CHEST 1 VIEW COMPARISON:  August 14, 2021 FINDINGS: Low lung volumes are seen with subsequent crowding of the bronchovascular lung markings. Mild prominence of the perihilar pulmonary vasculature is also seen. There is no evidence of focal consolidation, pleural effusion or pneumothorax. The heart size and mediastinal contours are within normal limits. Degenerative changes are seen throughout the thoracic spine. IMPRESSION: Low lung volumes with mild pulmonary vascular congestion. Electronically Signed   By: Virgina Norfolk M.D.   On: 09/11/2021 03:19        ASSESSMENT AND PLAN    -Multidisciplinary rounds held today -Goals of care discourse with patient -patient wishes to remain full code with fully aggressive measures including ACLS and endotracheal intubation.  Patient has not been intubated in the past   Septic shock -Present on admission-source is likely urinary versus pneumonia.  CT abdomen with left hydronephrosis and hydroureter.  Chest x-ray with bilateral multifocal airspace opacification suggestive of pneumonia. -empiric Vancomycin and Cefepime -Vasopressor support - levophed and vasopressin  -Solucortef 50  QID -Procalcitonin and lactate trending -MRSA PCR -COVID negative had infection last month -IVF - judicious use due to HFpEF with abnormal CXR -Central line with CVP trending  -Urology consultation - Dr Bernardo Heater apprecia ite recommendations - ureteral stenting possible   acute on chronic diastolic CHF -Chest x-ray with airspace and interstitial opacification caution with fluids with goal to euvolemia at this time and additional guidance with CVP trending -Bedside ultrasound assessment with virtual IVC -Lasix as post treatment and stabilization of shock physiology -BNP elevated over 400 despite morbid obesity digestive of cardiac decompensation.  This is likely due to infection with sepsis and septic shock.  Initial goal to treat underlying cause which includes infection is likely of urinary etiology -Work-up for disseminated intravascular coagulation including peripheral blood film   Moderate protein calorie malnutrition -Hypoalbuminemia with chronic deconditioning and peripheral muscle wasting -RD nutritionist consultation   history of DVT and PE - patient chronically on Xarelto -Recent GI bleed with GAVE caution with anticoagulation -There is thrombocytopenia which increases bleeding risk  Septic shock -use vasopressors to keep MAP>65 -follow ABG and LA -follow up cultures -emperic ABX -consider stress dose steroids   Remote COVID-19 pneumonia  Negative on this admission -Abnormal CXR noted may be related to residual inflammatory/fibrotic changes post COVID-19   Poorly controlled diabetes mellitus   -Recent hypoglycemia 23   -Caution with steroids and sepsis   -Consider diabetic coordinator for additional input and management of complex diabetes while acutely critically ill   Remote left lower leg cellulitis February 2023  -Improved from previous continue current antimicrobial regimen    Chronic symptomatic anemia Due to iron deficiency and B12 deficiency with  GAVE -Currently acute on chronic with partial dilutional effect post IVF resuscitation  ID -continue IV abx as prescibed -follow up cultures  GI/Nutrition GI PROPHYLAXIS as indicated DIET-->TF's as tolerated Constipation protocol as indicated  ENDO - ICU hypoglycemic\Hyperglycemia protocol -check FSBS per protocol   ELECTROLYTES -follow labs as needed -replace as needed -pharmacy consultation   DVT/GI PRX ordered -SCDs  TRANSFUSIONS AS NEEDED MONITOR FSBS ASSESS the need for LABS as needed    Critical care provider statement:   Total critical care time: 109 minutes   Performed by: Lanney Gins MD   Critical care time was exclusive of separately billable procedures and treating other patients.   Critical care was necessary to treat or prevent imminent or life-threatening deterioration.  Critical care was time spent personally by me on the following activities: development of treatment plan with patient and/or surrogate as well as nursing, discussions with consultants, evaluation of patient's response to treatment, examination of patient, obtaining history from patient or surrogate, ordering and performing treatments and interventions, ordering and review of laboratory studies, ordering and review of radiographic studies, pulse oximetry and re-evaluation of patient's condition.       Ottie Glazier, M.D.  Pulmonary & Critical Care Medicine

## 2021-09-11 NOTE — ED Notes (Signed)
Spoke with intensivist on the phone, is coming to evaluate the pt for further care ?

## 2021-09-11 NOTE — Progress Notes (Signed)
Sepsis tracking by eLINK 

## 2021-09-11 NOTE — ED Triage Notes (Signed)
Pt arrived via [facility] from home where she lives with family. Per EMS, pt has had fever, N/V/D x2 days. Pts oxygen in 80s and placed on 4L oxygen. EMS reports housing conditions poor and pt smelling of strong urine odor. Fever 104F rectal, HR 148bpm and BP 78/37 on arrival. MD at bedside. SEPSIS orders in place.  ?

## 2021-09-11 NOTE — Op Note (Signed)
Preoperative diagnosis:  ?Septic shock ?Left distal ureteral calculus with obstruction ? ?Postoperative diagnosis:  ?Same ? ?Procedure: ?Cystoscopy with left ureteral stent placement ?Left retrograde pyelogram ? ?Surgeon: Abbie Sons, MD ? ?Anesthesia: MAC ? ?Complications: None ? ?Intraoperative findings:  ?Cystoscopy-suboptimal visualization with flexible cystoscope.  No gross papillary lesions noted.  Ureteral orifices difficult to visualize ?Left retrograde pyelogram moderate left hydronephrosis with mild hydroureter ? ?EBL: Minimal ? ?Specimens: Urine left renal pelvis for Gram stain and urine culture ? ?Indication: Courtney Mack is a 66 y.o. female presented to ED hypotensive and temp of 104 degrees.  Lactate 4.0.  UA was completely normal however CT showed a 2 mm left distal ureteral calculus with moderate hydronephrosis/hydroureter.  No other obvious source of sepsis and stent placement recommended.  After reviewing the management options for treatment, he elected to proceed with the above surgical procedure(s). We have discussed the potential benefits and risks of the procedure, side effects of the proposed treatment, the likelihood of the patient achieving the goals of the procedure, and any potential problems that might occur during the procedure or recuperation. Informed consent has been obtained. ? ?Description of procedure: ? ?The patient was taken to the operating room and sedation was obtained by anesthesia.  The patient was unable to be placed in the dorsal lithotomy position as her knees were only able to be bent slightly and hips would not abduct.  She was kept in the supine position and external genitalia were prepped and draped in the usual sterile fashion.  She was on antibiotic therapy.  A preoperative time-out was performed.  ? ?With some difficulty vaginal exposure was obtained.  The urethral meatus was visualized and a digital flexible cystoscope was advanced into the bladder.   Panendoscopy was then performed with findings as described above.  The ureteral orifices were difficult to visualize.  An area at the left bladder base felt to be a possible UO was identified.  A 0.038 Sensor wire was advanced through the cystoscope and the wire advanced proximally under fluoroscopic guidance.  The cystoscope was removed and a 5 [place] open-ended ureteral catheter was advanced over the guidewire to the expected region of the renal pelvis.  The guidewire was removed and 10 mL of slightly turbid urine was aspirated and sent for Gram stain/culture.  The guidewire was replaced and the ureteral catheter was removed.  A 40F/26 cm Contour ureteral stent was advanced over the wire under fluoroscopic guidance.  The guidewire was removed and a good curl was seen in the bladder on fluoroscopy.  The proximal stent tip was within an upper pole calyx and no attempts at repositioning were attempted. ? ?A 16 [place] catheter was placed over a previously placed guidewire to mark the urethral meatus and the balloon was inflated with 10 mL of sterile water. ? ?She was then transferred back to the ICU ? ? ?Abbie Sons, M.D. ? ?

## 2021-09-11 NOTE — ED Notes (Signed)
NP at the bedside to place central IJ ?

## 2021-09-11 NOTE — Procedures (Signed)
Central Venous Catheter Insertion Procedure Note ?Courtney Mack ?[id number] ?1956/02/04 ? ?Procedure: Insertion of Central Venous Catheter ?Indications: Assessment of intravascular volume, Drug and/or fluid administration, and Frequent blood sampling ? ?Procedure Details ?Consent: Risks of procedure as well as the alternatives and risks of each were explained to the (patient/caregiver).  Consent for procedure obtained. ?Time Out: Verified patient identification, verified procedure, site/side was marked, verified correct patient position, special equipment/implants available, medications/allergies/relevent history reviewed, required imaging and test results available.  Performed ? ?Maximum sterile technique was used including antiseptics, cap, gloves, gown, hand hygiene, mask, and sheet. ?Skin prep: Chlorhexidine; local anesthetic administered ?A antimicrobial bonded/coated triple lumen catheter was placed in the right internal jugular vein using the Seldinger technique. ? ?Evaluation ?Blood flow good ?Complications: No apparent complications ?Patient did tolerate procedure well. ?Chest X-ray ordered to verify placement.  CXR: pending. ? ? ?Line secured at the 15 cm mark. ?BIOPATCH applied to the insertion site. ? ? ?Darel Hong, AGACNP-BC ?[facility] ?Prefer epic messenger for cross cover needs ?If after hours, please call E-link ? ?Bradly Bienenstock ?09/11/2021, 9:34 AM ? ? ?

## 2021-09-11 NOTE — Progress Notes (Signed)
PHARMACY -  BRIEF ANTIBIOTIC NOTE  ? ?Pharmacy has received consult(s) for Cefepime and Vancomycin from an ED provider.  The patient's profile has been reviewed for ht/wt/allergies/indication/available labs.   ? ?One time order(s) placed for Cefepime 2 gm and Vancomycin 2 gm per pt wt: 99.3 kg on 08/15/21 per D/C summary from 08/18/21. ? ?Further antibiotics/pharmacy consults should be ordered by admitting physician if indicated.       ?                ?Thank you, ?Renda Rolls, PharmD, MBA ?09/11/2021 ?2:13 AM ? ?

## 2021-09-11 NOTE — Progress Notes (Signed)
PHARMACY CONSULT NOTE ? ?Pharmacy Consult for Electrolyte Monitoring and Replacement  ? ?Recent Labs: ?Potassium (mmol/L)  ?Date Value  ?09/11/2021 3.9  ? ?Magnesium (mg/dL)  ?Date Value  ?08/16/2021 1.8  ? ?Calcium (mg/dL)  ?Date Value  ?09/11/2021 7.7 (L)  ? ?Albumin (g/dL)  ?Date Value  ?09/11/2021 2.4 (L)  ? ?Phosphorus (mg/dL)  ?Date Value  ?08/18/2021 3.5  ? ?Sodium (mmol/L)  ?Date Value  ?09/11/2021 139  ? ?Corrected Ca: 8.98 mg/dL ? ?Assessment:  66 y.o. 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 Pharmacy is asked to follow and replace electrolytes while in CCU. ? ?Goal of Therapy:  ?Electrolytes WNL ? ?Plan:  ?No electrolyte replacement warranted for today ?Recheck electrolytes in am 3/8 ? ?Courtney Mack ,PharmD ?Clinical Pharmacist ?09/11/2021 6:55 AM ? ?

## 2021-09-11 NOTE — Progress Notes (Signed)
Pharmacy Antibiotic Note ? ?Courtney Mack is a 66 y.o. female admitted on 09/11/2021 with sepsis.  Pharmacy has been consulted for vancomycin and cefepime dosing. ? ?-note that current dosing based on verbal weight given to ED RN as pt on ED stretcher that has no scale.   ?-f/u weight ? ?Plan: ?Pt received Cefepime 2 gm, Vancomycin 2000 mg and Metronidazole x 1 each in ED ? ?-will continue with Cefepime 2 gm IV q8h for Crcl 65 ml/min ? ?-will continue with Vancomycin 1000 mg IV Q 24 hrs. Goal AUC 400-550. ?Expected AUC: 476 ?SCr used: 0.97 ?Cmin 13.1  ?Vd= 0.5   BMI 55 ? ?-f/u weight and renal fxn ?  ? ?Weight: 120.2 kg (265 lb) ? ?Temp (24hrs), Avg:102 ?F (38.9 ?C), Min:99.1 ?F (37.3 ?C), Max:104 ?F (40 ?C) ? ?Recent Labs  ?Lab 09/11/21 ?0222  ?WBC 9.3  ?CREATININE 0.97  ?LATICACIDVEN 2.5*  4.0*  ?  ?Estimated Creatinine Clearance: 65.4 mL/min (by C-G formula based on SCr of 0.97 mg/dL).   ? ?No Known Allergies ? ?Antimicrobials this admission: ?Metronidazole x 1 3/7  ?Vancomycin 3/7 >>   ?Cefepime 3/7 >> ? ?Dose adjustments this admission: ?  ? ?Microbiology results: ?3/7 BCx: NG<12hrs ?3/7 UCx: pending  ?  Sputum:    ?3/7 MRSA PCR: pending ? ?Thank you for allowing pharmacy to be a part of this patient?s care. ? ?Shenell Rogalski A ?09/11/2021 9:04 AM ? ?

## 2021-09-11 NOTE — ED Notes (Signed)
Pt noticeably tachycardic on monitor at nurse's station. RN to bedside to assess, pt with audible wheezing, states her breathing feels "tight" Courtney Blue, MD notified, at bedside to assess. Breathing treatments ordered. ?

## 2021-09-11 NOTE — Consult Note (Signed)
Urology Consult  Requesting provider: Harlon Ditty, NP  Reason for consultation: Septic shock; ureteral calculus  Chief Complaint: Abdominal pain  History of Present Illness: Courtney Mack is a 66 y.o. with multiple comorbidities including DVT/PE, CHF and diabetes who presented to the ED early this morning hypotensive with a temp of 104 and shortness of breath, nausea, vomiting and left lower quadrant abdominal pain.  Started feeling poorly 24 hours prior to arrival.  Lactate was 4.0.  CT of the abdomen pelvis was performed which showed mild-moderate left hydronephrosis/hydroureter to a 2 mm left UVJ calculus.  CT also remarkable for left upper lobe/left basilar atelectasis and marked anasarca.    Urinalysis was completely normal without bacteria, WBCs or RBCs.  She denied prior history of stone disease or previous urologic problems  Past Medical History:  Diagnosis Date   Diabetes mellitus without complication (HCC)    DVT (deep venous thrombosis) (HCC) 09/06/2020   High cholesterol    Hx of blood clots    Hypertension    IDA (iron deficiency anemia) 09/21/2020    Past Surgical History:  Procedure Laterality Date   COLONOSCOPY N/A 06/21/2021   Procedure: COLONOSCOPY;  Surgeon: Toledo, Boykin Nearing, MD;  Location: [facility] ENDOSCOPY;  Service: Gastroenterology;  Laterality: N/A;   ENTEROSCOPY N/A 08/17/2021   Procedure: ENTEROSCOPY;  Surgeon: Toney Reil, MD;  Location: [facility] ENDOSCOPY;  Service: Gastroenterology;  Laterality: N/A;   ESOPHAGOGASTRODUODENOSCOPY N/A 06/21/2021   Procedure: ESOPHAGOGASTRODUODENOSCOPY (EGD);  Surgeon: Toledo, Boykin Nearing, MD;  Location: [facility] ENDOSCOPY;  Service: Gastroenterology;  Laterality: N/A;   ESOPHAGOGASTRODUODENOSCOPY (EGD) WITH PROPOFOL N/A 08/17/2021   Procedure: ESOPHAGOGASTRODUODENOSCOPY (EGD) WITH PROPOFOL;  Surgeon: Toney Reil, MD;  Location: [facility] ENDOSCOPY;  Service: Gastroenterology;  Laterality: N/A;   GIVENS CAPSULE  STUDY N/A 06/22/2021   Procedure: GIVENS CAPSULE STUDY;  Surgeon: Toledo, Boykin Nearing, MD;  Location: [facility] ENDOSCOPY;  Service: Gastroenterology;  Laterality: N/A;   IVC FILTER INSERTION N/A 08/18/2020   Procedure: IVC FILTER INSERTION;  Surgeon: Renford Dills, MD;  Location: [facility] INVASIVE CV LAB;  Service: Cardiovascular;  Laterality: N/A;   PERIPHERAL VASCULAR THROMBECTOMY Bilateral 01/03/2021   Procedure: PERIPHERAL VASCULAR THROMBECTOMY;  Surgeon: Annice Needy, MD;  Location: [facility] INVASIVE CV LAB;  Service: Cardiovascular;  Laterality: Bilateral;   PULMONARY THROMBECTOMY N/A 08/18/2020   Procedure: PULMONARY THROMBECTOMY;  Surgeon: Renford Dills, MD;  Location: [facility] INVASIVE CV LAB;  Service: Cardiovascular;  Laterality: N/A;   TUBAL LIGATION     VISCERAL ANGIOGRAPHY N/A 07/18/2021   Procedure: VISCERAL ANGIOGRAPHY;  Surgeon: Annice Needy, MD;  Location: [facility] INVASIVE CV LAB;  Service: Cardiovascular;  Laterality: N/A;    Home Medications:  No outpatient medications have been marked as taking for the 09/11/21 encounter [facility] Encounter).    Allergies: No Known Allergies  Family History  Problem Relation Age of Onset   Hypertension Mother    Diabetes Mother    Diabetes Father    Hypertension Father    High Cholesterol Father    Congestive Heart Failure Father    Breast cancer Cousin     Social History:  reports that she quit smoking about 41 years ago. Her smoking use included cigarettes. She has a 0.13 pack-year smoking history. She has never used smokeless tobacco. She reports that she does not currently use alcohol. She reports that she does not currently use drugs.  ROS: A complete review of systems was performed.  All systems are negative except for pertinent  findings as noted.  Physical Exam:  Vital signs in last 24 hours: Temp:  [99.1 F (37.3 C)-104 F (40 C)] 99.7 F (37.6 C) (03/07 1000) Pulse Rate:  [112-154] 112 (03/07 1130) Resp:  [22-44] 23 (03/07  1130) BP: (62-116)/(18-98) 86/50 (03/07 1130) SpO2:  [93 %-100 %] 93 % (03/07 1130) Weight:  [95.1 kg-120.2 kg] 95.1 kg (03/07 1138) Constitutional: Lethargic, ill-appearing  HEENT: [place] AT, moist mucus membranes.  Trachea midline, no masses Cardiovascular: Increased rate and rhythm, no clubbing, cyanosis, or edema. GI: Abdomen is soft, left lower quadrant tenderness GU: No CVA tenderness Skin: No rashes, bruises or suspicious lesions   Laboratory Data:  Recent Labs    09/11/21 0222 09/11/21 0848  WBC 9.3 11.9*  HGB 8.8* 8.5*  HCT 30.0* 28.0*   Recent Labs    09/11/21 0222 09/11/21 0848  NA 139  --   K 3.9  --   CL 112*  --   CO2 19*  --   GLUCOSE 103*  --   BUN 20  --   CREATININE 0.97 1.26*  CALCIUM 7.7*  --    Recent Labs    09/11/21 0222  INR 1.3*   No results for input(s): LABURIN in the last 72 hours. Results for orders placed or performed during the hospital encounter of 09/11/21  Resp Panel by RT-PCR (Flu A&B, Covid) Nasopharyngeal Swab     Status: None   Collection Time: 09/11/21  2:22 AM   Specimen: Nasopharyngeal Swab; Nasopharyngeal(NP) swabs in vial transport medium  Result Value Ref Range Status   SARS Coronavirus 2 by RT PCR NEGATIVE NEGATIVE Final    Comment: (NOTE) SARS-CoV-2 target nucleic acids are NOT DETECTED.  The SARS-CoV-2 RNA is generally detectable in upper respiratory specimens during the acute phase of infection. The lowest concentration of SARS-CoV-2 viral copies this assay can detect is 138 copies/mL. A negative result does not preclude SARS-Cov-2 infection and should not be used as the sole basis for treatment or other patient management decisions. A negative result may occur with  improper specimen collection/handling, submission of specimen other than nasopharyngeal swab, presence of viral mutation(s) within the areas targeted by this assay, and inadequate number of viral copies(<138 copies/mL). A negative result must be  combined with clinical observations, patient history, and epidemiological information. The expected result is Negative.  Fact Sheet for Patients:  BloggerCourse.com  Fact Sheet for Healthcare Providers:  SeriousBroker.it  This test is no t yet approved or cleared by the [place] FDA and  has been authorized for detection and/or diagnosis of SARS-CoV-2 by FDA under an Emergency Use Authorization (EUA). This EUA will remain  in effect (meaning this test can be used) for the duration of the COVID-19 declaration under Section 564(b)(1) of the Act, 21 U.S.C.section 360bbb-3(b)(1), unless the authorization is terminated  or revoked sooner.       Influenza A by PCR NEGATIVE NEGATIVE Final   Influenza B by PCR NEGATIVE NEGATIVE Final    Comment: (NOTE) The Xpert Xpress SARS-CoV-2/FLU/RSV plus assay is intended as an aid in the diagnosis of influenza from Nasopharyngeal swab specimens and should not be used as a sole basis for treatment. Nasal washings and aspirates are unacceptable for Xpert Xpress SARS-CoV-2/FLU/RSV testing.  Fact Sheet for Patients: BloggerCourse.com  Fact Sheet for Healthcare Providers: SeriousBroker.it  This test is not yet approved or cleared by the [place] FDA and has been authorized for detection and/or diagnosis of SARS-CoV-2 by FDA under an Emergency  Use Authorization (EUA). This EUA will remain in effect (meaning this test can be used) for the duration of the COVID-19 declaration under Section 564(b)(1) of the Act, 21 U.S.C. section 360bbb-3(b)(1), unless the authorization is terminated or revoked.  Performed at [facility], [address] [place], [place] [address]   Blood Culture (routine x 2)     Status: None (Preliminary result)   Collection Time: 09/11/21  2:23 AM   Specimen: BLOOD  Result Value Ref Range Status   Specimen  Description BLOOD LEFT AC  Final   Special Requests   Final    BOTTLES DRAWN AEROBIC AND ANAEROBIC Blood Culture results may not be optimal due to an excessive volume of blood received in culture bottles   Culture   Final    NO GROWTH < 12 HOURS Performed at [facility], [address], [place], [place] [address]    Report Status PENDING  Incomplete  Blood Culture (routine x 2)     Status: None (Preliminary result)   Collection Time: 09/11/21  2:23 AM   Specimen: BLOOD  Result Value Ref Range Status   Specimen Description BLOOD RIGHT FA  Final   Special Requests   Final    BOTTLES DRAWN AEROBIC AND ANAEROBIC Blood Culture results may not be optimal due to an inadequate volume of blood received in culture bottles   Culture   Final    NO GROWTH < 12 HOURS Performed at [facility], [address] [place], [place] [address]    Report Status PENDING  Incomplete  MRSA Next Gen by PCR, Nasal     Status: None   Collection Time: 09/11/21 10:07 AM   Specimen: Nasal Mucosa; Nasal Swab  Result Value Ref Range Status   MRSA by PCR Next Gen NOT DETECTED NOT DETECTED Final    Comment: (NOTE) The GeneXpert MRSA Assay (FDA approved for NASAL specimens only), is one component of a comprehensive MRSA colonization surveillance program. It is not intended to diagnose MRSA infection nor to guide or monitor treatment for MRSA infections. Test performance is not FDA approved in patients less than 33 years old. Performed at [facility], [address] [place], [place] [address]      Radiologic Imaging: CT images abdomen pelvis were personally reviewed and interpreted  CT CHEST ABDOMEN PELVIS W CONTRAST  Result Date: 09/11/2021 CLINICAL DATA:  Fever with nausea, vomiting and diarrhea. EXAM: CT CHEST, ABDOMEN, AND PELVIS WITH CONTRAST TECHNIQUE: Multidetector CT imaging of the chest, abdomen and pelvis was performed following the standard protocol during bolus  administration of intravenous contrast. RADIATION DOSE REDUCTION: This exam was performed according to the departmental dose-optimization program which includes automated exposure control, adjustment of the mA and/or kV according to patient size and/or use of iterative reconstruction technique. CONTRAST:  OMNIPAQUE IOHEXOL 300 MG/ML  SOLN COMPARISON:  August 14, 2021 FINDINGS: CT CHEST FINDINGS Cardiovascular: There is mild to moderate severity calcification of the aortic arch, without evidence of aortic aneurysm or dissection. Normal heart size. A very small amount of pericardial fluid is noted. Mediastinum/Nodes: No enlarged mediastinal, hilar, or axillary lymph nodes. Thyroid gland, trachea, and esophagus demonstrate no significant findings. Lungs/Pleura: Mild atelectasis is seen along the anteromedial aspect of the left upper lobe and posterior aspect of the left lung base. There is no evidence of a pleural effusion or pneumothorax. Musculoskeletal: Degenerative changes are seen throughout the thoracic spine CT ABDOMEN PELVIS FINDINGS Hepatobiliary: No focal liver abnormality  is seen. No gallstones, gallbladder wall thickening, or biliary dilatation. Pancreas: Unremarkable. No pancreatic ductal dilatation or surrounding inflammatory changes. Spleen: Normal in size without focal abnormality. Adrenals/Urinary Tract: Adrenal glands are unremarkable. Kidneys are normal in size, without focal lesions. Mild asymmetric renal cortical enhancement is seen on the left. There is mild to moderate severity left-sided hydronephrosis and hydroureter. This is new when compared to the prior study. A 2 mm left ureteral calculus is noted within the distal left ureter (axial CT image 107, CT series 2), proximal to the left UVJ. Mild inflammatory fat stranding is seen surrounding the urinary bladder which is poorly distended and subsequently limited in evaluation. Stomach/Bowel: Stomach is within normal limits. Appendix  appears normal. No evidence of bowel wall thickening, distention, or inflammatory changes. Vascular/Lymphatic: Aortic atherosclerosis with marked severity calcification along the origin of the bilateral renal arteries. An inferior vena cava filter is in place. A stable appearing cluster of para-aortic lymph nodes is seen just below the level of the left renal artery. The largest measures approximately 1.4 cm. Reproductive: Uterus and bilateral adnexa are unremarkable. Other: Marked severity anasarca is seen along the lateral aspects of the abdominal and pelvic walls. No abdominopelvic ascites. Musculoskeletal: Degenerative changes seen throughout the lumbar spine. IMPRESSION: 1. 2 mm distal left ureteral calculus with mild to moderate severity left-sided hydronephrosis and hydroureter. 2. Mild left upper lobe and left basilar atelectatic changes. 3. Stable marked severity anasarca. Aortic Atherosclerosis (ICD10-I70.0). Electronically Signed   By: Aram Candela M.D.   On: 09/11/2021 04:18   DG Chest Port 1 View  Result Date: 09/11/2021 CLINICAL DATA:  Central line placement EXAM: PORTABLE CHEST 1 VIEW COMPARISON:  CT chest and chest radiographs performed on the same date FINDINGS: The heart size and mediastinal contours are within normal limits. Prominence of the bronchovascular markings, likely secondary to vascular crowding due to low lung volumes. No pleural effusion or pneumothorax. No acute osseous abnormality. Right IJ access central line with distal tip in the SVC. IMPRESSION: Right IJ access central line with distal tip in the SVC, otherwise no significant interval change. Electronically Signed   By: Larose Hires D.O.   On: 09/11/2021 09:52   DG Chest Port 1 View  Result Date: 09/11/2021 CLINICAL DATA:  Shortness of breath. EXAM: PORTABLE CHEST 1 VIEW COMPARISON:  August 14, 2021 FINDINGS: Low lung volumes are seen with subsequent crowding of the bronchovascular lung markings. Mild prominence of  the perihilar pulmonary vasculature is also seen. There is no evidence of focal consolidation, pleural effusion or pneumothorax. The heart size and mediastinal contours are within normal limits. Degenerative changes are seen throughout the thoracic spine. IMPRESSION: Low lung volumes with mild pulmonary vascular congestion. Electronically Signed   By: Aram Candela M.D.   On: 09/11/2021 03:19    Impression/Assessment:  65 y.o. female with septic shock.  Urinalysis was normal however in discussing with the ICU team there is no obvious source of her sepsis  Plan:  Recommend urgent cystoscopy with left ureteral stent placement.  Urine will be collected from the renal pelvis for culture.  The procedure was discussed in detail with Ms. Solari and her daughter Courtney Mack who gave consent over the phone.  The procedure was discussed in detail including potential risks of worsening sepsis and ureteral injury.   09/11/2021, 12:34 PM  Irineo Axon,  MD

## 2021-09-11 NOTE — Progress Notes (Signed)
PHARMACY - PHYSICIAN COMMUNICATION ?CRITICAL VALUE ALERT - BLOOD CULTURE IDENTIFICATION (BCID) ? ?Courtney Mack is an 66 y.o. female who presented to [facility] on 09/11/2021 with a chief complaint of fever, nausea, vomiting, and strong urine odor. ? ?Assessment: Blood culture from 3/7 with GNR in both sets, BCID detects Proteus species.  ? ?Name of physician (or Provider) Contacted: Dr Lanney Gins ? ?Current antibiotics: vancomycin and cefepime ? ?Changes to prescribed antibiotics recommended:  ?Recommendations accepted by provider - change to ceftriaxone ? ?Results for orders placed or performed during the hospital encounter of 09/11/21  ?Blood Culture ID Panel (Reflexed) (Collected: 09/11/2021  2:23 AM)  ?Result Value Ref Range  ? Enterococcus faecalis NOT DETECTED NOT DETECTED  ? Enterococcus Faecium NOT DETECTED NOT DETECTED  ? Listeria monocytogenes NOT DETECTED NOT DETECTED  ? Staphylococcus species NOT DETECTED NOT DETECTED  ? Staphylococcus aureus (BCID) NOT DETECTED NOT DETECTED  ? Staphylococcus epidermidis NOT DETECTED NOT DETECTED  ? Staphylococcus lugdunensis NOT DETECTED NOT DETECTED  ? Streptococcus species NOT DETECTED NOT DETECTED  ? Streptococcus agalactiae NOT DETECTED NOT DETECTED  ? Streptococcus pneumoniae NOT DETECTED NOT DETECTED  ? Streptococcus pyogenes NOT DETECTED NOT DETECTED  ? A.calcoaceticus-baumannii NOT DETECTED NOT DETECTED  ? Bacteroides fragilis NOT DETECTED NOT DETECTED  ? Enterobacterales DETECTED (A) NOT DETECTED  ? Enterobacter cloacae complex NOT DETECTED NOT DETECTED  ? Escherichia coli NOT DETECTED NOT DETECTED  ? Klebsiella aerogenes NOT DETECTED NOT DETECTED  ? Klebsiella oxytoca NOT DETECTED NOT DETECTED  ? Klebsiella pneumoniae NOT DETECTED NOT DETECTED  ? Proteus species DETECTED (A) NOT DETECTED  ? Salmonella species NOT DETECTED NOT DETECTED  ? Serratia marcescens NOT DETECTED NOT DETECTED  ? Haemophilus influenzae NOT DETECTED NOT DETECTED  ? Neisseria  meningitidis NOT DETECTED NOT DETECTED  ? Pseudomonas aeruginosa NOT DETECTED NOT DETECTED  ? Stenotrophomonas maltophilia NOT DETECTED NOT DETECTED  ? Candida albicans NOT DETECTED NOT DETECTED  ? Candida auris NOT DETECTED NOT DETECTED  ? Candida glabrata NOT DETECTED NOT DETECTED  ? Candida krusei NOT DETECTED NOT DETECTED  ? Candida parapsilosis NOT DETECTED NOT DETECTED  ? Candida tropicalis NOT DETECTED NOT DETECTED  ? Cryptococcus neoformans/gattii NOT DETECTED NOT DETECTED  ? CTX-M ESBL NOT DETECTED NOT DETECTED  ? Carbapenem resistance IMP NOT DETECTED NOT DETECTED  ? Carbapenem resistance KPC NOT DETECTED NOT DETECTED  ? Carbapenem resistance NDM NOT DETECTED NOT DETECTED  ? Carbapenem resist OXA 48 LIKE NOT DETECTED NOT DETECTED  ? Carbapenem resistance VIM NOT DETECTED NOT DETECTED  ? ? ?[name], PharmD, BCPS, BCIDP ?Work Cell: [phone] ?09/11/2021 4:27 PM ? ? ? ?

## 2021-09-11 NOTE — Progress Notes (Addendum)
Patient back up to 110mg Levophed. Inquired about restarting Vasopressin. Inquired about Lactic Acid levels not trended. Advised that cause was resolved and lab no longer needed. Will continue to monitor.   ?

## 2021-09-11 NOTE — Progress Notes (Signed)
CODE SEPSIS - PHARMACY COMMUNICATION ? ?**Broad Spectrum Antibiotics should be administered within 1 hour of Sepsis diagnosis** ? ?Time Code Sepsis Called/Page Received: 0202 ? ?Antibiotics Ordered: Cefepime, Flagyl, & Vancomycin ? ?Time of 1st antibiotic administration: 0239 ? ?Renda Rolls, PharmD, MBA ?09/11/2021 ?2:10 AM ? ? ?

## 2021-09-11 NOTE — ED Provider Notes (Signed)
[facility] Provider Note    Event Date/Time   First MD Initiated Contact with Patient 09/11/21 [phone]     (approximate)   History   Code Sepsis   HPI  Courtney Mack is a 66 y.o. 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  She notes that she has been feeling generally unwell since yesterday.  She endorses abdominal pain nausea vomiting difficulty breathing.  History is somewhat limited given to the patient's critical illness.  Denies chest pain.    Past Medical History:  Diagnosis Date   Diabetes mellitus without complication ([place])    DVT (deep venous thrombosis) ([place]) 09/06/2020   High cholesterol    Hx of blood clots    Hypertension    IDA (iron deficiency anemia) 09/21/2020    Patient Active Problem List   Diagnosis Date Noted   Septic shock ([place]) 09/11/2021   COVID-19    Melena 07/18/2021   Acute on chronic blood loss anemia 07/18/2021   Pressure injury of skin 06/21/2021   Symptomatic anemia 06/20/2021   Sepsis ([place]) 06/20/2021   HLD (hyperlipidemia) 06/20/2021   Fall at home, initial encounter 06/20/2021   Cough 06/20/2021   Chronic diastolic CHF (congestive heart failure) ([place]) 06/20/2021   GI bleeding 06/20/2021   Cellulitis of left lower leg 06/20/2021   Acute bilateral deep vein thrombosis (DVT) of femoral veins (HCC) 01/02/2021   DOE (dyspnea on exertion) 11/10/2020   Risk factors for obstructive sleep apnea 11/10/2020   Uterine cancer ([place]) 11/08/2020   IDA (iron deficiency anemia) 09/21/2020   Endometrial cancer ([place]) 09/13/2020   Acute blood loss anemia 09/06/2020   Vaginal bleeding 09/06/2020   DVT (deep venous thrombosis) ([place]) 09/06/2020   Acute massive pulmonary embolism ([place]) 08/25/2020   Anemia, unspecified 08/24/2020   Morbidly obese ([place]) 08/23/2020    Essential hypertension 08/23/2020   Diabetes mellitus without complication ([place]) 85/88/5027   Dyslipidemia 08/23/2020   Acute respiratory failure with hypoxia (HCC)    Pulmonary embolism ([place]) 08/18/2020   Vaginal bleeding, abnormal 08/18/2020   Obesity, morbid, BMI 50 or higher ([place]) 08/18/2020     Physical Exam  Triage Vital Signs: ED Triage Vitals [09/11/21 0212]  Enc Vitals Group     BP (!) 81/50     Pulse Rate (!) 129     Resp (!) 22     Temp (!) 104 F (40 C)     Temp Source Rectal     SpO2 100 %     Weight      Height      Head Circumference      Peak Flow      Pain Score      Pain Loc      Pain Edu?      Excl. in [place]?     Most recent vital signs: Vitals:   09/11/21 0620 09/11/21 0700  BP: (!) 101/56 111/86  Pulse: (!) 127 (!) 150  Resp: (!) 36 (!) 43  Temp: 99.1 F (37.3 C)   SpO2: 100% 97%     General: Awake, patient appears ill CV:  Good peripheral perfusion.  1+ edema symmetric bilaterally Resp:  Patient is tachypneic, lung sounds are clear, frequent cough Abd:  No distention.  She is tender in the bilateral lower quadrants and suprapubic region with no guarding  Neuro:             Awake, Alert, Oriented x 3  Other:  Patient is fatigued appearing, eyes are closed but she does awaken appropriate to voice and is alert and oriented able to follow commands multiple scabs and excoriations throughout her and back Stage I pressure ulcer on her bottom with some skin breakdown around the anus Brown stool on rectal exam   ED Results / Procedures / Treatments  Labs (all labs ordered are listed, but only abnormal results are displayed) Labs Reviewed  LACTIC ACID, PLASMA - Abnormal; Notable for the following components:      Result Value   Lactic Acid, Venous 4.0 (*)    All other components within normal limits  LACTIC ACID, PLASMA - Abnormal; Notable for the following components:   Lactic Acid, Venous 2.5 (*)    All other components within normal limits   COMPREHENSIVE METABOLIC PANEL - Abnormal; Notable for the following components:   Chloride 112 (*)    CO2 19 (*)    Glucose, Bld 103 (*)    Calcium 7.7 (*)    Total Protein 5.4 (*)    Albumin 2.4 (*)    AST 46 (*)    Total Bilirubin 1.8 (*)    All other components within normal limits  CBC WITH DIFFERENTIAL/PLATELET - Abnormal; Notable for the following components:   RBC 3.38 (*)    Hemoglobin 8.8 (*)    HCT 30.0 (*)    MCHC 29.3 (*)    RDW 17.7 (*)    Platelets 126 (*)    Neutro Abs 8.4 (*)    Lymphs Abs 0.3 (*)    All other components within normal limits  PROTIME-INR - Abnormal; Notable for the following components:   Prothrombin Time 16.1 (*)    INR 1.3 (*)    All other components within normal limits  URINALYSIS, COMPLETE (UACMP) WITH MICROSCOPIC - Abnormal; Notable for the following components:   Color, Urine YELLOW (*)    APPearance CLEAR (*)    Specific Gravity, Urine >1.046 (*)    Ketones, ur 5 (*)    All other components within normal limits  BRAIN NATRIURETIC PEPTIDE - Abnormal; Notable for the following components:   B Natriuretic Peptide 473.3 (*)    All other components within normal limits  TROPONIN I (HIGH SENSITIVITY) - Abnormal; Notable for the following components:   Troponin I (High Sensitivity) 23 (*)    All other components within normal limits  TROPONIN I (HIGH SENSITIVITY) - Abnormal; Notable for the following components:   Troponin I (High Sensitivity) 29 (*)    All other components within normal limits  RESP PANEL BY RT-PCR (FLU A&B, COVID) ARPGX2  CULTURE, BLOOD (ROUTINE X 2)  CULTURE, BLOOD (ROUTINE X 2)  URINE CULTURE  APTT  CBC  CREATININE, SERUM  LACTIC ACID, PLASMA  PROCALCITONIN     EKG  Interpreted by myself, sinus tachycardia, normal intervals no acute ischemic changes   RADIOLOGY  Reviewed the patient's CT chest abdomen pelvis which is notable for 2 mm left distal ureteral stone as well as atelectasis in the left  lung  PROCEDURES:  Critical Care performed: Yes, see critical care procedure note(s)  .1-3 Lead EKG Interpretation Performed by: Rada Hay, MD Authorized by: Rada Hay, MD     Interpretation: abnormal     ECG rate assessment: tachycardic     Rhythm: sinus tachycardia     Ectopy: none  Conduction: normal    The patient is on the cardiac monitor to evaluate for evidence of arrhythmia and/or significant heart rate changes.   MEDICATIONS ORDERED IN ED: Medications  lactated ringers infusion (0 mLs Intravenous Hold 09/11/21 0622)  norepinephrine (LEVOPHED) '4mg'$  in 240m (0.016 mg/mL) premix infusion (7 mcg/min Intravenous Rate/Dose Change 09/11/21 0714)  docusate sodium (COLACE) capsule 100 mg (has no administration in time range)  polyethylene glycol (MIRALAX / GLYCOLAX) packet 17 g (has no administration in time range)  heparin injection 5,000 Units (has no administration in time range)  ceFEPIme (MAXIPIME) 2 g in sodium chloride 0.9 % 100 mL IVPB (0 g Intravenous Stopped 09/11/21 0338)  metroNIDAZOLE (FLAGYL) IVPB 500 mg (0 mg Intravenous Stopped 09/11/21 0255)  lactated ringers bolus 2,000 mL (0 mLs Intravenous Stopped 09/11/21 0621)  vancomycin (VANCOREADY) IVPB 2000 mg/400 mL (0 mg Intravenous Stopped 09/11/21 0510)  acetaminophen (TYLENOL) suppository 650 mg (650 mg Rectal Given 09/11/21 0228)  iohexol (OMNIPAQUE) 300 MG/ML solution 125 mL (125 mLs Intravenous Contrast Given 09/11/21 0400)  ipratropium-albuterol (DUONEB) 0.5-2.5 (3) MG/3ML nebulizer solution 3 mL (3 mLs Nebulization Given 09/11/21 0705)  ipratropium-albuterol (DUONEB) 0.5-2.5 (3) MG/3ML nebulizer solution 3 mL (3 mLs Nebulization Given 09/11/21 0704)  ipratropium-albuterol (DUONEB) 0.5-2.5 (3) MG/3ML nebulizer solution 3 mL (3 mLs Nebulization Given 09/11/21 0703)     IMPRESSION / MDM / ASSESSMENT AND PLAN / ED COURSE  I reviewed the triage vital signs and the nursing notes.                               Differential diagnosis includes, but is not limited to, septic shock, cardiogenic shock, UTI, pneumonia, intra-abdominal sepsis  Patient is a 66year old female presenting with fever and hypotension.  EMS initially presents with shortness of breath.  Patient complains of abdominal pain vomiting difficulty breathing.  She is febrile to 104 tachycardic to the 1 teens and hypotensive with a blood pressure in the 795Jsystolic and tachypneic with 37.  She clearly meets sepsis criteria.  On exam she has multiple excoriations and old scabs no obvious skin infection.  She has brown stool on rectal exam with some skin breakdown around the anus but no melena or bright red blood per rectum.  Her abdomen is tender but benign.  She is quite tachypneic requiring oxygen with frequent cough.  I am concerned for sepsis.  We will cover broadly with cefepime vancomycin and Flagyl we will give fluid bolus and start empiric Levophed at this point given her hypotension.  Will obtain chest x-ray as well as CT of her abdomen when she is stable.  Patient's lactate is 4, no leukocytosis, hemoglobin is 8.8 which is around her baseline, her CMP is unremarkable, renal function is normal, bicarb mildly low at 19 patient's UA has no WBCs or bacteria.  Troponins are elevated but flat at 23 and 29.  CT chest abdomen pelvis obtained.  She has a left 2 mm distal ureteral stone with hydroureter and hydronephrosis as well as left upper lobe and left basilar atelectatic changes.  Called by radiology.  Overall unclear what the source of her sepsis is.  She has a's kidney stone but urine is not showing signs of infection.  Considered meningitis given no other source, patient is on Xarelto so unable to receive a tap.  On reassessment patient's mental status is improved she complains of pain in her left lower quadrant.  Discussed with  Dr. Bernardo Heater who agrees that a septic stone is less likely.  On reassessment patient has increased work of  breathing is now wheezing.  Does have a history of COPD.  We will treat with DuoNebs.  She is maintaining her oxygen saturation on 3 L nasal cannula.   Clinical Course as of 09/11/21 0727  Tue Sep 11, 2021  0337 Lactic Acid, Venous(!!): 4.0 [KM]    Clinical Course User Index [KM] Rada Hay, MD     FINAL CLINICAL IMPRESSION(S) / ED DIAGNOSES   Final diagnoses:  Septic shock ([place])  COPD exacerbation ([place])     Rx / DC Orders   ED Discharge Orders     None        Note:  This document was prepared using Dragon voice recognition software and may include unintentional dictation errors.   Rada Hay, MD 09/11/21 [phone]

## 2021-09-11 NOTE — Progress Notes (Signed)
PT Cancellation Note ? ?Patient Details ?Name: Courtney Mack ?MRN: [id number] ?DOB: 08/10/1955 ? ? ?Cancelled Treatment:    Reason Eval/Treat Not Completed: Medical issues which prohibited therapy;Patient not medically ready (Pt admitted to CCU this date. In past 4 hours vtials show HR >110bpm and MAP 62-58mHg. WIll defer PT evaluation to later date/time.) ? ?2:09 PM, 09/11/21 ?AEtta Grandchild PT, DPT ?[place] Therapist - [place]?[facility] ?[phone](ASCOM)  ? ? ?Courtney Mack ?09/11/2021, 2:09 PM ?

## 2021-09-11 NOTE — Transfer of Care (Signed)
Immediate Anesthesia Transfer of Care Note ? ?Patient: Courtney Mack ? ?Procedure(s) Performed: CYSTOSCOPY WITH RETROGRADE PYELOGRAM/URETERAL STENT PLACEMENT (Left) ? ?Patient Location: PACU and ICU ? ?Anesthesia Type:MAC ? ?Level of Consciousness: awake ? ?Airway & Oxygen Therapy: Patient connected to face mask ? ?Post-op Assessment: Report given to RN ? ?Post vital signs: stable ? ?Last Vitals:  ?Vitals Value Taken Time  ?BP    ?Temp    ?Pulse    ?Resp    ?SpO2    ? ? ?Last Pain:  ?Vitals:  ? 09/11/21 1000  ?TempSrc: Oral  ?   ? ?  ? ?Complications: No notable events documented. ?

## 2021-09-12 ENCOUNTER — Inpatient Hospital Stay
Admit: 2021-09-12 | Discharge: 2021-09-12 | Disposition: A | Discharge status: Home / Self Care | Payer: Medicare (Managed Care) | Attending: Pulmonary Disease | Admitting: Pulmonary Disease

## 2021-09-12 ENCOUNTER — Encounter: Payer: Self-pay | Admitting: Urology

## 2021-09-12 DIAGNOSIS — A419 Sepsis, unspecified organism: Secondary | ICD-10-CM | POA: Diagnosis not present

## 2021-09-12 DIAGNOSIS — N201 Calculus of ureter: Secondary | ICD-10-CM

## 2021-09-12 DIAGNOSIS — R6521 Severe sepsis with septic shock: Secondary | ICD-10-CM | POA: Diagnosis not present

## 2021-09-12 DIAGNOSIS — Z515 Encounter for palliative care: Secondary | ICD-10-CM

## 2021-09-12 DIAGNOSIS — Z7189 Other specified counseling: Secondary | ICD-10-CM

## 2021-09-12 DIAGNOSIS — D72829 Elevated white blood cell count, unspecified: Secondary | ICD-10-CM | POA: Diagnosis not present

## 2021-09-12 LAB — RESPIRATORY PANEL BY PCR

## 2021-09-12 LAB — CBC
HCT: 26.9 % — ABNORMAL LOW (ref 36.0–46.0)
Hemoglobin: 8.1 g/dL — ABNORMAL LOW (ref 12.0–15.0)
MCH: 26.2 pg (ref 26.0–34.0)
MCHC: 30.1 g/dL (ref 30.0–36.0)
MCV: 87.1 fL (ref 80.0–100.0)
Platelets: 126 10*3/uL — ABNORMAL LOW (ref 150–400)
RBC: 3.09 MIL/uL — ABNORMAL LOW (ref 3.87–5.11)
RDW: 17.9 % — ABNORMAL HIGH (ref 11.5–15.5)
WBC: 25 10*3/uL — ABNORMAL HIGH (ref 4.0–10.5)
nRBC: 0 % (ref 0.0–0.2)

## 2021-09-12 LAB — GLUCOSE, CAPILLARY
Glucose-Capillary: 176 mg/dL — ABNORMAL HIGH (ref 70–99)
Glucose-Capillary: 181 mg/dL — ABNORMAL HIGH (ref 70–99)
Glucose-Capillary: 141 mg/dL — ABNORMAL HIGH (ref 70–99)
Glucose-Capillary: 137 mg/dL — ABNORMAL HIGH (ref 70–99)

## 2021-09-12 LAB — LEGIONELLA PNEUMOPHILA SEROGP 1 UR AG: L. pneumophila Serogp 1 Ur Ag: NEGATIVE

## 2021-09-12 LAB — RENAL FUNCTION PANEL
Albumin: 2.6 g/dL — ABNORMAL LOW (ref 3.5–5.0)
Anion gap: 8 (ref 5–15)
BUN: 30 mg/dL — ABNORMAL HIGH (ref 8–23)
CO2: 23 mmol/L (ref 22–32)
Calcium: 8.7 mg/dL — ABNORMAL LOW (ref 8.9–10.3)
Chloride: 106 mmol/L (ref 98–111)
Creatinine, Ser: 0.93 mg/dL (ref 0.44–1.00)
GFR, Estimated: >60 mL/min (ref >60)
Glucose, Bld: 208 mg/dL — ABNORMAL HIGH (ref 70–99)
Phosphorus: 3.4 mg/dL (ref 2.5–4.6)
Potassium: 3.3 mmol/L — ABNORMAL LOW (ref 3.5–5.1)
Sodium: 137 mmol/L (ref 135–145)

## 2021-09-12 LAB — ECHOCARDIOGRAM COMPLETE
AR max vel: 2.95 cm2
AV Area VTI: 3.15 cm2
AV Area mean vel: 3.11 cm2
AV Mean grad: 3 mmHg
AV Peak grad: 6.6 mmHg
Ao pk vel: 1.28 m/s
Area-P 1/2: 5.7 cm2
Height: 58 in
MV VTI: 4.11 cm2
S' Lateral: 3.2 cm
Weight: 3410.96 oz

## 2021-09-12 LAB — MAGNESIUM: Magnesium: 1.5 mg/dL — ABNORMAL LOW (ref 1.7–2.4)

## 2021-09-12 LAB — LACTIC ACID, PLASMA
Lactic Acid, Venous: 1.8 mmol/L (ref 0.5–1.9)
Lactic Acid, Venous: 1.7 mmol/L (ref 0.5–1.9)

## 2021-09-12 LAB — PROCALCITONIN: Procalcitonin: 93.99 ng/mL

## 2021-09-12 MED ORDER — ADULT MULTIVITAMIN W/MINERALS CH
1.0000 | ORAL_TABLET | Freq: Every day | ORAL | Status: DC
  Administered 2021-09-13 – 2021-09-18 (×6): 1 via ORAL
  Filled 2021-09-12 (×6): qty 1

## 2021-09-12 MED ORDER — ENSURE MAX PROTEIN PO LIQD
11.0000 [oz_av] | Freq: Two times a day (BID) | ORAL | Status: DC
  Administered 2021-09-12 – 2021-09-18 (×13): 11 [oz_av] via ORAL
  Filled 2021-09-12: qty 330

## 2021-09-12 MED ORDER — MAGNESIUM SULFATE 2 GM/50ML IV SOLN
2.0000 g | Freq: Once | INTRAVENOUS | Status: AC
  Administered 2021-09-12: 2 g via INTRAVENOUS
  Filled 2021-09-12: qty 50

## 2021-09-12 MED ORDER — HEPARIN SODIUM (PORCINE) 5000 UNIT/ML IJ SOLN
5000.0000 [IU] | Freq: Three times a day (TID) | INTRAMUSCULAR | Status: DC
  Administered 2021-09-12 – 2021-09-17 (×15): 5000 [IU] via SUBCUTANEOUS
  Filled 2021-09-12 (×15): qty 1

## 2021-09-12 MED ORDER — INSULIN ASPART 100 UNIT/ML IJ SOLN
0.0000 [IU] | Freq: Three times a day (TID) | INTRAMUSCULAR | Status: DC
  Administered 2021-09-12: 1 [IU] via SUBCUTANEOUS
  Administered 2021-09-12: 12:00:00 2 [IU] via SUBCUTANEOUS
  Administered 2021-09-13: 12:00:00 1 [IU] via SUBCUTANEOUS
  Administered 2021-09-13: 16:00:00 2 [IU] via SUBCUTANEOUS
  Administered 2021-09-14: 1 [IU] via SUBCUTANEOUS
  Administered 2021-09-14: 2 [IU] via SUBCUTANEOUS
  Administered 2021-09-15: 1 [IU] via SUBCUTANEOUS
  Administered 2021-09-16 (×2): 2 [IU] via SUBCUTANEOUS
  Administered 2021-09-17: 1 [IU] via SUBCUTANEOUS
  Filled 2021-09-12 (×12): qty 1

## 2021-09-12 MED ORDER — POTASSIUM CHLORIDE 10 MEQ/100ML IV SOLN
10.0000 meq | INTRAVENOUS | Status: AC
  Administered 2021-09-12 (×4): 10 meq via INTRAVENOUS
  Filled 2021-09-12 (×4): qty 100

## 2021-09-12 MED ORDER — LOPERAMIDE HCL 2 MG PO CAPS
2.0000 mg | ORAL_CAPSULE | ORAL | Status: DC | PRN
  Administered 2021-09-12: 12:00:00 2 mg via ORAL
  Filled 2021-09-12: qty 1

## 2021-09-12 NOTE — Evaluation (Signed)
Physical Therapy Evaluation ?Patient Details ?Name: Courtney Mack ?MRN: [id number] ?DOB: 17-Oct-1955 ?Today's Date: 09/12/2021 ? ?History of Present Illness ? Courtney Mack is a 79yoF who [facility] with abd pain and in code sepsis.  PMH: PE/DVT xarelto, recurent GIB, HF, DM. Pt is status post discharge from hospital less than 1 month ago with presyncope, chest pain and hypoglycemic episode and was found to have a GI bleed while on Xarelto status post GI evaluation.  During that admission in February 2023 she was also COVID-19 positive.  ?Clinical Impression ? Pt admitted with above diagnosis. Pt currently with functional limitations due to the deficits listed below (see "PT Problem List"). Upon entry, pt in bed, awake and agreeable to participate. The pt is alert, pleasant, interactive, and able to provide info regarding prior level of function, both in tolerance and independence. Pt still on pressure support on arrival, BP 564P -329J systolic without any orthostatic drops. Physical asssit required to EOB, then minA to rise from elevated surface twice. Pt takes a few very small pivot steps to recliner. Pt appears much weaker than her reported baseline, she attests to significant acute weakness. Patient's performance this date reveals decreased ability, independence, and tolerance in performing all basic mobility required for performance of activities of daily living. Pt requires additional DME, close physical assistance, and cues for safe participate in mobility. Pt will benefit from skilled PT intervention to increase independence and safety with basic mobility in preparation for discharge to the venue listed below.   ? ?   ?   ? ?Recommendations for follow up therapy are one component of a multi-disciplinary discharge planning process, led by the attending physician.  Recommendations may be updated based on patient status, additional functional criteria and insurance authorization. ? ?Follow Up  Recommendations Skilled nursing-short term rehab (<3 hours/day) ? ?  ?Assistance Recommended at Discharge Intermittent Supervision/Assistance  ?Patient can return home with the following ? A lot of help with walking and/or transfers;A lot of help with bathing/dressing/bathroom;A little help with bathing/dressing/bathroom;Two people to help with walking and/or transfers ? ?  ?Equipment Recommendations  (defer to facility)  ?Recommendations for Other Services ?    ?  ?Functional Status Assessment    ? ?  ?Precautions / Restrictions Precautions ?Precautions: Fall ?Restrictions ?Weight Bearing Restrictions: No  ? ?  ? ?Mobility ? Bed Mobility ?  ?  ?  ?  ?Supine to sit: Min assist, HOB elevated ?  ?  ?General bed mobility comments: good trunk support once at EOB ?  ? ?Transfers ?Overall transfer level: Needs assistance ?Equipment used: 1 person hand held assist ?Transfers: Sit to/from Stand ?Sit to Stand: From elevated surface, Min assist ?  ?  ?  ?  ?  ?General transfer comment: pulls self up, hip extension dominant pattern with reduceds inee extension moment ?  ? ?Ambulation/Gait ?Ambulation/Gait assistance:  (deferred due to lines/leads adn space constraint) ?  ?  ?  ?  ?  ?  ?  ? ?Stairs ?  ?  ?  ?  ?  ? ?Wheelchair Mobility ?  ? ?Modified Rankin (Stroke Patients Only) ?  ? ?  ? ?Balance   ?  ?  ?  ?  ?  ?  ?  ?  ?  ?  ?  ?  ?  ?  ?  ?  ?  ?  ?   ? ? ? ?Pertinent Vitals/Pain Pain Assessment ?Pain Assessment: Faces ?Faces  Pain Scale: Hurts little more ?Pain Location: BLE calf pain, common since remote DVT issue ?Pain Intervention(s): Limited activity within patient's tolerance, Monitored during session  ? ? ?Home Living Family/patient expects to be discharged to:: Private residence ?Living Arrangements: Children Kyra Manges;) ?Available Help at Discharge: Family ?Type of Home: House ?Home Access: Ramped entrance ?  ?  ?  ?Home Layout: One level ?Home Equipment: Rollator (4 wheels);BSC/3in1 ?Additional Comments: has lift  recliner  ?  ?Prior Function Prior Level of Function : Independent/Modified Independent;History of Falls (last six months) (1 fall enroute to hospital, 1 fall a year ago. Pt does nto drive due to cataract.) ?  ?  ?  ?  ?  ?  ?Mobility Comments: Pt reports household ambulator using 4WW ?ADLs Comments: DTR assisst for LB bathing, pt able to dress self. ?  ? ? ?Hand Dominance  ? Dominant Hand: Right ? ?  ?Extremity/Trunk Assessment  ?   ?  ? ?  ?  ? ?   ?Communication  ?    ?Cognition Arousal/Alertness: Awake/alert ?Behavior During Therapy: [facility] for tasks assessed/performed ?Overall Cognitive Status: Within Functional Limits for tasks assessed ?  ?  ?  ?  ?  ?  ?  ?  ?  ?  ?  ?  ?  ?  ?  ?  ?  ?  ?  ? ?  ?General Comments   ? ?  ?Exercises    ? ?Assessment/Plan  ?  ?PT Assessment Patient needs continued PT services  ?PT Problem List Decreased strength;Decreased range of motion;Decreased activity tolerance;Decreased balance;Decreased mobility ? ?   ?  ?PT Treatment Interventions DME instruction;Gait training;Stair training;Functional mobility training;Therapeutic activities;Therapeutic exercise;Balance training;Patient/family education   ? ?PT Goals (Current goals can be found in the Care Plan section)  ?Acute Rehab PT Goals ?Patient Stated Goal: regain strength ?PT Goal Formulation: With patient ?Time For Goal Achievement: 09/26/21 ?Potential to Achieve Goals: Fair ? ?  ?Frequency Min 2X/week ?  ? ? ?Co-evaluation   ?  ?  ?  ?  ? ? ?  ?AM-PAC PT "6 Clicks" Mobility  ?Outcome Measure Help needed turning from your back to your side while in a flat bed without using bedrails?: A Lot ?Help needed moving from lying on your back to sitting on the side of a flat bed without using bedrails?: A Lot ?Help needed moving to and from a bed to a chair (including a wheelchair)?: A Lot ?Help needed standing up from a chair using your arms (e.g., wheelchair or bedside chair)?: A Lot ?Help needed to walk in hospital room?: Total ?Help  needed climbing 3-5 steps with a railing? : Total ?6 Click Score: 10 ? ?  ?End of Session   ?Activity Tolerance: Patient tolerated treatment well ?Patient left: in bed;with call bell/phone within reach ?Nurse Communication: Mobility status ?PT Visit Diagnosis: Unsteadiness on feet (R26.81);Difficulty in walking, not elsewhere classified (R26.2);Other abnormalities of gait and mobility (R26.89) ?  ? ?Time: [phone] ?PT Time Calculation (min) (ACUTE ONLY): 25 min ? ? ?Charges:   PT Evaluation ?$PT Eval Moderate Complexity: 1 Mod ?PT Treatments ?$Therapeutic Exercise: 8-22 mins ?  ?   ? ?11:58 AM, 09/12/21 ?Etta Grandchild, PT, DPT ?Physical Therapist - [place] ?[facility]  ?[phone] (ASCOM)  ? ? ?Richard Holz C ?09/12/2021, 11:56 AM ? ?

## 2021-09-12 NOTE — Progress Notes (Signed)
PHARMACY CONSULT NOTE ? ?Pharmacy Consult for Electrolyte Monitoring and Replacement  ? ?Recent Labs: ?Potassium (mmol/L)  ?Date Value  ?09/12/2021 3.3 (L)  ? ?Magnesium (mg/dL)  ?Date Value  ?09/12/2021 1.5 (L)  ? ?Calcium (mg/dL)  ?Date Value  ?09/12/2021 8.7 (L)  ? ?Albumin (g/dL)  ?Date Value  ?09/12/2021 2.6 (L)  ? ?Phosphorus (mg/dL)  ?Date Value  ?09/12/2021 3.4  ? ?Sodium (mmol/L)  ?Date Value  ?09/12/2021 137  ? ?Corrected Ca: 8.98 mg/dL ? ?Assessment:  66 y.o. 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 Pharmacy is asked to follow and replace electrolytes while in CCU. ? ?Goal of Therapy:  ?Electrolytes WNL ? ?Plan:  ?K: 3.9>3.3 - Replete w/ KCL IV 34mq q1h x4 ?Mg: 1.5 - Replete w/ MgSO4 IV 2g x1 ?Recheck electrolytes in am 3/9 ? ?BLorna Dibble PharmD, BCCP ?Clinical Pharmacist ?09/12/2021 8:23 AM ? ? ?

## 2021-09-12 NOTE — Progress Notes (Signed)
CRITICAL CARE     Name: Courtney Mack MRN: [id number] DOB: [birth year]     LOS: 1   SUBJECTIVE FINDINGS & SIGNIFICANT EVENTS    Patient description:   This is a pleasant 66 year old female with history of chronic bilateral severe lymphedema, both PE and DVT approximately a year ago, diastolic heart failure, recurrent respiratory failure with fluid shift and pulmonary edema history of uterine cancer chronic anemia, morbid obesity, dyslipidemia, status post discharge from hospital less than 1 month ago with presyncope, chest pain and hypoglycemic episode and was found to have a GI bleed while on Xarelto status post GI evaluation.  During that admission in February 2023 she was also COVID-19 positive.  She came to the emergency room due to feeling unwell x2 days with flulike illness including fevers abdominal pain nausea vomiting and shortness of breath.  She was found to have Tmax of 104 with circulatory shock critically ill requiring admission to medical intensive care unit.  She is conversive and on 4 L/min nasal cannula was able to reach normoxia there is mild encephalopathy during interview.  Empiric diagnosis on present on admission is septic shock due to intra-abdominal infection likely urinary system due to abnormal CT abdomen with left-sided hydronephrosis and hydroureter which is new from previous study additional etiology includes pneumonia due to abnormal chest x-ray with bilateral airspace opacification reviewed dependently by me.  Lab work is consistent with sepsis including elevated lactate leukocytosis coupled with vitals showing tachycardia and fever.  Goals of care discussion performed with patient and confirmed with nurse Thereasa Solo RN patient remains full code and wishes to have fully aggressive scope of  care.   09/12/21-  patient s/p surgery with urology service. + loose stools.  Optimizing diabetic regimen.  TOC consult for home support. Electrolytes being managed by pharmacy. +bacteremia overnight - proteus. PT/OT is following, she does have chronic and severe lymphedema. Palliative has been seeing patient.    Lines/tubes :   Microbiology/Sepsis markers: Results for orders placed or performed during the hospital encounter of 09/11/21  Resp Panel by RT-PCR (Flu A&B, Covid) Nasopharyngeal Swab     Status: None   Collection Time: 09/11/21  2:22 AM   Specimen: Nasopharyngeal Swab; Nasopharyngeal(NP) swabs in vial transport medium  Result Value Ref Range Status   SARS Coronavirus 2 by RT PCR NEGATIVE NEGATIVE Final    Comment: (NOTE) SARS-CoV-2 target nucleic acids are NOT DETECTED.  The SARS-CoV-2 RNA is generally detectable in upper respiratory specimens during the acute phase of infection. The lowest concentration of SARS-CoV-2 viral copies this assay can detect is 138 copies/mL. A negative result does not preclude SARS-Cov-2 infection and should not be used as the sole basis for treatment or other patient management decisions. A negative result may occur with  improper specimen collection/handling, submission of specimen other than nasopharyngeal swab, presence of viral mutation(s) within the areas targeted by this assay, and inadequate number of viral copies(<138 copies/mL). A negative result must be combined with clinical observations, patient history, and epidemiological information. The expected result is Negative.  Fact Sheet for Patients:  EntrepreneurPulse.com.au  Fact Sheet for Healthcare Providers:  IncredibleEmployment.be  This test is no t yet approved or cleared by the [place] FDA and  has been authorized for detection and/or diagnosis of SARS-CoV-2 by FDA under an Emergency Use Authorization (EUA). This EUA will remain  in  effect (meaning this test can be used) for the duration of the COVID-19 declaration under Section 564(b)(1)  of the Act, 21 U.S.C.section 360bbb-3(b)(1), unless the authorization is terminated  or revoked sooner.       Influenza A by PCR NEGATIVE NEGATIVE Final   Influenza B by PCR NEGATIVE NEGATIVE Final    Comment: (NOTE) The Xpert Xpress SARS-CoV-2/FLU/RSV plus assay is intended as an aid in the diagnosis of influenza from Nasopharyngeal swab specimens and should not be used as a sole basis for treatment. Nasal washings and aspirates are unacceptable for Xpert Xpress SARS-CoV-2/FLU/RSV testing.  Fact Sheet for Patients: EntrepreneurPulse.com.au  Fact Sheet for Healthcare Providers: IncredibleEmployment.be  This test is not yet approved or cleared by the [place] FDA and has been authorized for detection and/or diagnosis of SARS-CoV-2 by FDA under an Emergency Use Authorization (EUA). This EUA will remain in effect (meaning this test can be used) for the duration of the COVID-19 declaration under Section 564(b)(1) of the Act, 21 U.S.C. section 360bbb-3(b)(1), unless the authorization is terminated or revoked.  Performed at [facility], [address] [place], [place] [address]   Urine Culture     Status: Abnormal (Preliminary result)   Collection Time: 09/11/21  2:22 AM   Specimen: In/Out Cath Urine  Result Value Ref Range Status   Specimen Description   Final    IN/OUT CATH URINE Performed at [facility], [address], [place], [place] [address]    Special Requests   Final    NONE Performed at [facility], [address] [place], [place] [address]    Culture (A)  Final    50,000 COLONIES/mL GRAM NEGATIVE RODS SUSCEPTIBILITIES TO FOLLOW CULTURE REINCUBATED FOR BETTER GROWTH Performed at [facility], [place] [address], [place], [place] [address]    Report Status PENDING  Incomplete   Respiratory (~20 pathogens) panel by PCR     Status: None   Collection Time: 09/11/21  2:22 AM   Specimen: Nasopharyngeal Swab; Respiratory  Result Value Ref Range Status   Adenovirus NOT DETECTED NOT DETECTED Final   Coronavirus 229E NOT DETECTED NOT DETECTED Final    Comment: (NOTE) The Coronavirus on the Respiratory Panel, DOES NOT test for the novel  Coronavirus (2019 nCoV)    Coronavirus HKU1 NOT DETECTED NOT DETECTED Final   Coronavirus NL63 NOT DETECTED NOT DETECTED Final   Coronavirus OC43 NOT DETECTED NOT DETECTED Final   Metapneumovirus NOT DETECTED NOT DETECTED Final   Rhinovirus / Enterovirus NOT DETECTED NOT DETECTED Final   Influenza A NOT DETECTED NOT DETECTED Final   Influenza B NOT DETECTED NOT DETECTED Final   Parainfluenza Virus 1 NOT DETECTED NOT DETECTED Final   Parainfluenza Virus 2 NOT DETECTED NOT DETECTED Final   Parainfluenza Virus 3 NOT DETECTED NOT DETECTED Final   Parainfluenza Virus 4 NOT DETECTED NOT DETECTED Final   Respiratory Syncytial Virus NOT DETECTED NOT DETECTED Final   Bordetella pertussis NOT DETECTED NOT DETECTED Final   Bordetella Parapertussis NOT DETECTED NOT DETECTED Final   Chlamydophila pneumoniae NOT DETECTED NOT DETECTED Final   Mycoplasma pneumoniae NOT DETECTED NOT DETECTED Final    Comment: Performed at [facility], [place]. [address], [place], [place] [address]  Blood Culture (routine x 2)     Status: Abnormal (Preliminary result)   Collection Time: 09/11/21  2:23 AM   Specimen: BLOOD  Result Value Ref Range Status   Specimen Description   Final    BLOOD LEFT [facility] Performed at [facility], [address], [place],  [address]  Special Requests   Final    BOTTLES DRAWN AEROBIC AND ANAEROBIC Blood Culture results may not be optimal due to an excessive volume of blood received in culture bottles Performed at [facility], [address] [place], [place] [address]    Culture  Setup Time   Final     GRAM NEGATIVE RODS ANAEROBIC BOTTLE ONLY Organism ID to follow CRITICAL RESULT CALLED TO, READ BACK BY AND VERIFIED WITH: ALEX CHAPPELL 09/11/21 [place] WITH RESULT Performed at [facility], [place] [address] [place], [place] [address]    Culture PROTEUS MIRABILIS (A)  Final   Report Status PENDING  Incomplete  Blood Culture (routine x 2)     Status: Abnormal (Preliminary result)   Collection Time: 09/11/21  2:23 AM   Specimen: BLOOD  Result Value Ref Range Status   Specimen Description   Final    BLOOD RIGHT FA Performed at [facility], [address], [place], [place] [address]    Special Requests   Final    BOTTLES DRAWN AEROBIC AND ANAEROBIC Blood Culture results may not be optimal due to an inadequate volume of blood received in culture bottles Performed at [facility], [address] [place], [place] [address]    Culture  Setup Time   Final    GRAM NEGATIVE RODS IN BOTH AEROBIC AND ANAEROBIC BOTTLES CRITICAL VALUE NOTED.  VALUE IS CONSISTENT WITH PREVIOUSLY REPORTED AND CALLED VALUE.    Culture PROTEUS MIRABILIS (A)  Final   Report Status PENDING  Incomplete  Blood Culture ID Panel (Reflexed)     Status: Abnormal   Collection Time: 09/11/21  2:23 AM  Result Value Ref Range Status   Enterococcus faecalis NOT DETECTED NOT DETECTED Final   Enterococcus Faecium NOT DETECTED NOT DETECTED Final   Listeria monocytogenes NOT DETECTED NOT DETECTED Final   Staphylococcus species NOT DETECTED NOT DETECTED Final   Staphylococcus aureus (BCID) NOT DETECTED NOT DETECTED Final   Staphylococcus epidermidis NOT DETECTED NOT DETECTED Final   Staphylococcus lugdunensis NOT DETECTED NOT DETECTED Final   Streptococcus species NOT DETECTED NOT DETECTED Final   Streptococcus agalactiae NOT DETECTED NOT DETECTED Final   Streptococcus pneumoniae NOT DETECTED NOT DETECTED Final   Streptococcus pyogenes NOT DETECTED NOT DETECTED Final    A.calcoaceticus-baumannii NOT DETECTED NOT DETECTED Final   Bacteroides fragilis NOT DETECTED NOT DETECTED Final   Enterobacterales DETECTED (A) NOT DETECTED Final    Comment: Enterobacterales represent a large order of gram negative bacteria, not a single organism. CRITICAL RESULT CALLED TO, READ BACK BY AND VERIFIED WITH: ALEX CHAPPELL 09/11/21 1525 KLW    Enterobacter cloacae complex NOT DETECTED NOT DETECTED Final   Escherichia coli NOT DETECTED NOT DETECTED Final   Klebsiella aerogenes NOT DETECTED NOT DETECTED Final   Klebsiella oxytoca NOT DETECTED NOT DETECTED Final   Klebsiella pneumoniae NOT DETECTED NOT DETECTED Final   Proteus species DETECTED (A) NOT DETECTED Final    Comment: CRITICAL RESULT CALLED TO, READ BACK BY AND VERIFIED WITH: ALEX CHAPPELL 09/11/21 1525 KLW    Salmonella species NOT DETECTED NOT DETECTED Final   Serratia marcescens NOT DETECTED NOT DETECTED Final   Haemophilus influenzae NOT DETECTED NOT DETECTED Final   Neisseria meningitidis NOT DETECTED NOT DETECTED Final   Pseudomonas aeruginosa NOT DETECTED NOT DETECTED Final   Stenotrophomonas maltophilia NOT DETECTED NOT DETECTED Final   Candida albicans NOT DETECTED NOT DETECTED Final   Candida auris NOT DETECTED NOT  DETECTED Final   Candida glabrata NOT DETECTED NOT DETECTED Final   Candida krusei NOT DETECTED NOT DETECTED Final   Candida parapsilosis NOT DETECTED NOT DETECTED Final   Candida tropicalis NOT DETECTED NOT DETECTED Final   Cryptococcus neoformans/gattii NOT DETECTED NOT DETECTED Final   CTX-M ESBL NOT DETECTED NOT DETECTED Final   Carbapenem resistance IMP NOT DETECTED NOT DETECTED Final   Carbapenem resistance KPC NOT DETECTED NOT DETECTED Final   Carbapenem resistance NDM NOT DETECTED NOT DETECTED Final   Carbapenem resist OXA 48 LIKE NOT DETECTED NOT DETECTED Final   Carbapenem resistance VIM NOT DETECTED NOT DETECTED Final    Comment: Performed at [facility], [address] [place], [place] [address]  MRSA Next Gen by PCR, Nasal     Status: None   Collection Time: 09/11/21 10:07 AM   Specimen: Nasal Mucosa; Nasal Swab  Result Value Ref Range Status   MRSA by PCR Next Gen NOT DETECTED NOT DETECTED Final    Comment: (NOTE) The GeneXpert MRSA Assay (FDA approved for NASAL specimens only), is one component of a comprehensive MRSA colonization surveillance program. It is not intended to diagnose MRSA infection nor to guide or monitor treatment for MRSA infections. Test performance is not FDA approved in patients less than 62 years old. Performed at [facility], [address] [place], [place] [address]   Gram stain     Status: None   Collection Time: 09/11/21  4:22 PM   Specimen: PATH Other; Urine  Result Value Ref Range Status   Specimen Description   Final    URINE, RANDOM Performed at [facility], [address] [place], [place] [address]    Special Requests   Final    LEFT RENAL PELVIS Performed at [facility], [address] [place], [place] [address]    Gram Stain   Final    WBC PRESENT,BOTH PMN AND MONONUCLEAR GRAM NEGATIVE RODS CYTOSPIN SMEAR Performed at [facility], [place] [address], [place],  [address]    Report Status 09/11/2021 FINAL  Final    Anti-infectives:  Anti-infectives (From admission, onward)    Start     Dose/Rate Route Frequency Ordered Stop   09/12/21 0800  cefTRIAXone (ROCEPHIN) 2 g in sodium chloride 0.9 % 100 mL IVPB        2 g 200 mL/hr over 30 Minutes Intravenous Every 24 hours 09/11/21 1737     09/12/21 0400  vancomycin (VANCOCIN) IVPB 1000 mg/200 mL premix  Status:  Discontinued        1,000 mg 200 mL/hr over 60 Minutes Intravenous Every 24 hours 09/11/21 0903 09/11/21 1737   09/11/21 2200  ceFEPIme (MAXIPIME) 2 g in sodium chloride 0.9 % 100 mL IVPB  Status:  Discontinued        2 g 200 mL/hr over 30 Minutes Intravenous Every 12 hours 09/11/21 1308 09/11/21 1737    09/11/21 1800  ceFEPIme (MAXIPIME) 2 g in sodium chloride 0.9 % 100 mL IVPB        2 g 200 mL/hr over 30 Minutes Intravenous Every 12 hours 09/11/21 1737 09/11/21 1916   09/11/21 1000  ceFEPIme (MAXIPIME) 2 g in sodium chloride 0.9 % 100 mL IVPB  Status:  Discontinued        2 g 200 mL/hr over 30 Minutes Intravenous Every 8 hours 09/11/21 0854 09/11/21 1308   09/11/21 0215  ceFEPIme (MAXIPIME) 2 g in sodium chloride 0.9 % 100  mL IVPB        2 g 200 mL/hr over 30 Minutes Intravenous  Once 09/11/21 0208 09/11/21 0338   09/11/21 0215  metroNIDAZOLE (FLAGYL) IVPB 500 mg        500 mg 100 mL/hr over 60 Minutes Intravenous  Once 09/11/21 0208 09/11/21 0255   09/11/21 0215  vancomycin (VANCOCIN) IVPB 1000 mg/200 mL premix  Status:  Discontinued        1,000 mg 200 mL/hr over 60 Minutes Intravenous  Once 09/11/21 0208 09/11/21 0211   09/11/21 0215  vancomycin (VANCOREADY) IVPB 2000 mg/400 mL        2,000 mg 200 mL/hr over 120 Minutes Intravenous  Once 09/11/21 0211 09/11/21 0510        Consults: Treatment Team:  Abbie Sons, MD     Tests / Events: -CT abdomen and pelvis reviewed by me independently  -Chest x-ray with bilateral airspace opacification with clear costophrenic angle suggestive of pneumonia  PAST MEDICAL HISTORY   Past Medical History:  Diagnosis Date   Diabetes mellitus without complication ([place])    DVT (deep venous thrombosis) ([place]) 09/06/2020   High cholesterol    Hx of blood clots    Hypertension    IDA (iron deficiency anemia) 09/21/2020     SURGICAL HISTORY   Past Surgical History:  Procedure Laterality Date   COLONOSCOPY N/A 06/21/2021   Procedure: COLONOSCOPY;  Surgeon: Toledo, Benay Pike, MD;  Location: [facility] ENDOSCOPY;  Service: Gastroenterology;  Laterality: N/A;   ENTEROSCOPY N/A 08/17/2021   Procedure: ENTEROSCOPY;  Surgeon: Lin Landsman, MD;  Location: [facility] ENDOSCOPY;  Service: Gastroenterology;  Laterality: N/A;    ESOPHAGOGASTRODUODENOSCOPY N/A 06/21/2021   Procedure: ESOPHAGOGASTRODUODENOSCOPY (EGD);  Surgeon: Toledo, Benay Pike, MD;  Location: [facility] ENDOSCOPY;  Service: Gastroenterology;  Laterality: N/A;   ESOPHAGOGASTRODUODENOSCOPY (EGD) WITH PROPOFOL N/A 08/17/2021   Procedure: ESOPHAGOGASTRODUODENOSCOPY (EGD) WITH PROPOFOL;  Surgeon: Lin Landsman, MD;  Location: [facility] ENDOSCOPY;  Service: Gastroenterology;  Laterality: N/A;   GIVENS CAPSULE STUDY N/A 06/22/2021   Procedure: GIVENS CAPSULE STUDY;  Surgeon: Toledo, Benay Pike, MD;  Location: [facility] ENDOSCOPY;  Service: Gastroenterology;  Laterality: N/A;   IVC FILTER INSERTION N/A 08/18/2020   Procedure: IVC FILTER INSERTION;  Surgeon: Katha Cabal, MD;  Location: [place] CV LAB;  Service: Cardiovascular;  Laterality: N/A;   PERIPHERAL VASCULAR THROMBECTOMY Bilateral 01/03/2021   Procedure: PERIPHERAL VASCULAR THROMBECTOMY;  Surgeon: Algernon Huxley, MD;  Location: [place] CV LAB;  Service: Cardiovascular;  Laterality: Bilateral;   PULMONARY THROMBECTOMY N/A 08/18/2020   Procedure: PULMONARY THROMBECTOMY;  Surgeon: Katha Cabal, MD;  Location: [place] [facility] LAB;  Service: Cardiovascular;  Laterality: N/A;   TUBAL LIGATION     VISCERAL ANGIOGRAPHY N/A 07/18/2021   Procedure: VISCERAL ANGIOGRAPHY;  Surgeon: Algernon Huxley, MD;  Location: [place] [facility];  Service: Cardiovascular;  Laterality: N/A;     FAMILY HISTORY   Family History  Problem Relation Age of Onset   Hypertension Mother    Diabetes Mother    Diabetes Father    Hypertension Father    High Cholesterol Father    Congestive Heart Failure Father    Breast cancer Cousin      SOCIAL HISTORY   Social History   Tobacco Use   Smoking status: Former    Packs/day: 0.25    Years: 0.50    Pack years: 0.13    Types: Cigarettes    Quit date: 09/21/1980  Years since quitting: 41.0   Smokeless tobacco: Never   Tobacco comments:    smoked 2-4 cigarettes for  about 2-3 weeks   Vaping Use   Vaping Use: Never used  Substance Use Topics   Alcohol use: Not Currently   Drug use: Not Currently     MEDICATIONS   Current Medication:  Current Facility-Administered Medications:    acetaminophen (TYLENOL) tablet 650 mg, 650 mg, Oral, Q6H PRN, Darel Hong D, NP, 650 mg at 09/11/21 0846   cefTRIAXone (ROCEPHIN) 2 g in sodium chloride 0.9 % 100 mL IVPB, 2 g, Intravenous, Q24H, Ottie Glazier, MD, Stopped at 09/12/21 0973   Chlorhexidine Gluconate Cloth 2 % PADS 6 each, 6 each, Topical, Q0600, Ottie Glazier, MD, 6 each at 09/12/21 0958   docusate sodium (COLACE) capsule 100 mg, 100 mg, Oral, BID PRN, Tukov-Yual, Magdalene S, NP   hydrocortisone sodium succinate (SOLU-CORTEF) 100 MG injection 100 mg, 100 mg, Intravenous, Q12H, Darel Hong D, NP, 100 mg at 09/12/21 0812   ipratropium (ATROVENT) nebulizer solution 0.5 mg, 0.5 mg, Nebulization, Q6H, Darel Hong D, NP, 0.5 mg at 09/12/21 0745   levalbuterol (XOPENEX) nebulizer solution 1.25 mg, 1.25 mg, Nebulization, Q6H, Darel Hong D, NP, 1.25 mg at 09/12/21 0745   magnesium sulfate IVPB 2 g 50 mL, 2 g, Intravenous, Once, Beers, Shanon Brow, RPH, Last Rate: 50 mL/hr at 09/12/21 0955, 2 g at 09/12/21 0955   nitroGLYCERIN (NITROGLYN) 2 % ointment 1 inch, 1 inch, Topical, Q8H, Elvy Mclarty, MD, 1 inch at 09/11/21 1305   norepinephrine (LEVOPHED) '4mg'$  in 238m (0.016 mg/mL) premix infusion, 0-40 mcg/min, Intravenous, Continuous, MRada Hay MD, Last Rate: 37.5 mL/hr at 09/12/21 0859, 10 mcg/min at 09/12/21 0859   ondansetron (ZOFRAN) injection 4 mg, 4 mg, Intravenous, Q6H PRN, KBradly Bienenstock NP   pantoprazole (PROTONIX) EC tablet 40 mg, 40 mg, Oral, Daily, KDarel HongD, NP, 40 mg at 09/12/21 0951   polyethylene glycol (MIRALAX / GLYCOLAX) packet 17 g, 17 g, Oral, Daily PRN, Tukov-Yual, Magdalene S, NP   potassium chloride 10 mEq in 100 mL IVPB, 10 mEq, Intravenous, Q1 Hr x 4,  Beers, BShanon Brow RPH, Last Rate: 100 mL/hr at 09/12/21 0957, 10 mEq at 09/12/21 0957   sodium chloride flush (NS) 0.9 % injection 10-40 mL, 10-40 mL, Intracatheter, Q12H, Marlyn Rabine, MD, 30 mL at 09/12/21 0958   sodium chloride flush (NS) 0.9 % injection 10-40 mL, 10-40 mL, Intracatheter, PRN, AOttie Glazier MD   vasopressin (PITRESSIN) 20 Units in sodium chloride 0.9 % 100 mL infusion-*FOR SHOCK*, 0-0.03 Units/min, Intravenous, Continuous, KBradly Bienenstock NP, Stopped at 09/11/21 1908    ALLERGIES   Patient has no known allergies.    REVIEW OF SYSTEMS     10 point review of systems conducted and is negative except for abdominal pain, lower extremity weakness, dryness of the mouth, shortness of breath.  PHYSICAL EXAMINATION   Vital Signs: Temp:  [98 F (36.7 C)-99.2 F (37.3 C)] 98 F (36.7 C) (03/08 0800) Pulse Rate:  [81-117] 103 (03/08 1000) Resp:  [18-36] 25 (03/08 1000) BP: (86-143)/(47-77) 97/58 (03/08 1000) SpO2:  [72 %-100 %] 94 % (03/08 1000) Weight:  [95.1 kg-96.7 kg] 96.7 kg (03/08 0335)  GENERAL: Age-appropriate mild distress due to acutely critical ill status HEAD: Normocephalic, atraumatic.  EYES: Presbyopia with round, reactive to light.  No scleral icterus.  MOUTH: Moist mucosal membrane. NECK: Supple. No thyromegaly. No nodules. No JVD.  PULMONARY: Mild rhonchi bilaterally  CARDIOVASCULAR: S1 and S2. Regular rate and rhythm. No murmurs, rubs, or gallops.  GASTROINTESTINAL: Soft, nontender, non-distended. No masses. Positive bowel sounds. No hepatosplenomegaly.  MUSCULOSKELETAL: No swelling, clubbing, or edema.  NEUROLOGIC: Mild distress due to acute illness SKIN:intact,warm,dry   PERTINENT DATA     Infusions:  cefTRIAXone (ROCEPHIN)  IV Stopped (09/12/21 5631)   magnesium sulfate bolus IVPB 2 g (09/12/21 0955)   norepinephrine (LEVOPHED) Adult infusion 10 mcg/min (09/12/21 0859)   potassium chloride 10 mEq (09/12/21 0957)   vasopressin  Stopped (09/11/21 1908)   Scheduled Medications:  Chlorhexidine Gluconate Cloth  6 each Topical Q0600   hydrocortisone sod succinate (SOLU-CORTEF) inj  100 mg Intravenous Q12H   ipratropium  0.5 mg Nebulization Q6H   levalbuterol  1.25 mg Nebulization Q6H   nitroGLYCERIN  1 inch Topical Q8H   pantoprazole  40 mg Oral Daily   sodium chloride flush  10-40 mL Intracatheter Q12H   PRN Medications: acetaminophen, docusate sodium, ondansetron (ZOFRAN) IV, polyethylene glycol, sodium chloride flush Hemodynamic parameters: CVP:  [4 mmHg-13 mmHg] 12 mmHg Intake/Output: 03/07 0701 - 03/08 0700 In: 3658.1 [I.V.:3558.1; IV Piggyback:100.1] Out: 1025 [Urine:1025]  Ventilator  Settings:    LAB RESULTS:  Basic Metabolic Panel: Recent Labs  Lab 09/11/21 0222 09/11/21 0848 09/12/21 0455  NA 139  --  137  K 3.9  --  3.3*  CL 112*  --  106  CO2 19*  --  23  GLUCOSE 103*  --  208*  BUN 20  --  30*  CREATININE 0.97 1.26* 0.93  CALCIUM 7.7*  --  8.7*  MG  --   --  1.5*  PHOS  --   --  3.4    Liver Function Tests: Recent Labs  Lab 09/11/21 0222 09/12/21 0455  AST 46*  --   ALT 11  --   ALKPHOS 38  --   BILITOT 1.8*  --   PROT 5.4*  --   ALBUMIN 2.4* 2.6*    No results for input(s): LIPASE, AMYLASE in the last 168 hours. No results for input(s): AMMONIA in the last 168 hours. CBC: Recent Labs  Lab 09/11/21 0222 09/11/21 0848 09/12/21 0455  WBC 9.3 11.9* 25.0*  NEUTROABS 8.4*  --   --   HGB 8.8* 8.5* 8.1*  HCT 30.0* 28.0* 26.9*  MCV 88.8 87.0 87.1  PLT 126* 123* 126*    Cardiac Enzymes: No results for input(s): CKTOTAL, CKMB, CKMBINDEX, TROPONINI in the last 168 hours. BNP: Invalid input(s): POCBNP CBG: Recent Labs  Lab 09/11/21 1001 09/12/21 0725  GLUCAP 142* 176*       IMAGING RESULTS:  Imaging: CT CHEST ABDOMEN PELVIS W CONTRAST  Result Date: 09/11/2021 CLINICAL DATA:  Fever with nausea, vomiting and diarrhea. EXAM: CT CHEST, ABDOMEN, AND PELVIS  WITH CONTRAST TECHNIQUE: Multidetector CT imaging of the chest, abdomen and pelvis was performed following the standard protocol during bolus administration of intravenous contrast. RADIATION DOSE REDUCTION: This exam was performed according to the departmental dose-optimization program which includes automated exposure control, adjustment of the mA and/or kV according to patient size and/or use of iterative reconstruction technique. CONTRAST:  150m OMNIPAQUE IOHEXOL 300 MG/ML  SOLN COMPARISON:  August 14, 2021 FINDINGS: CT CHEST FINDINGS Cardiovascular: There is mild to moderate severity calcification of the aortic arch, without evidence of aortic aneurysm or dissection. Normal heart size. A very small amount of pericardial fluid is noted. Mediastinum/Nodes: No enlarged mediastinal, hilar, or axillary lymph nodes. Thyroid gland, trachea,  and esophagus demonstrate no significant findings. Lungs/Pleura: Mild atelectasis is seen along the anteromedial aspect of the left upper lobe and posterior aspect of the left lung base. There is no evidence of a pleural effusion or pneumothorax. Musculoskeletal: Degenerative changes are seen throughout the thoracic spine CT ABDOMEN PELVIS FINDINGS Hepatobiliary: No focal liver abnormality is seen. No gallstones, gallbladder wall thickening, or biliary dilatation. Pancreas: Unremarkable. No pancreatic ductal dilatation or surrounding inflammatory changes. Spleen: Normal in size without focal abnormality. Adrenals/Urinary Tract: Adrenal glands are unremarkable. Kidneys are normal in size, without focal lesions. Mild asymmetric renal cortical enhancement is seen on the left. There is mild to moderate severity left-sided hydronephrosis and hydroureter. This is new when compared to the prior study. A 2 mm left ureteral calculus is noted within the distal left ureter (axial CT image 107, CT series 2), proximal to the left UVJ. Mild inflammatory fat stranding is seen surrounding the  urinary bladder which is poorly distended and subsequently limited in evaluation. Stomach/Bowel: Stomach is within normal limits. Appendix appears normal. No evidence of bowel wall thickening, distention, or inflammatory changes. Vascular/Lymphatic: Aortic atherosclerosis with marked severity calcification along the origin of the bilateral renal arteries. An inferior vena cava filter is in place. A stable appearing cluster of para-aortic lymph nodes is seen just below the level of the left renal artery. The largest measures approximately 1.4 cm. Reproductive: Uterus and bilateral adnexa are unremarkable. Other: Marked severity anasarca is seen along the lateral aspects of the abdominal and pelvic walls. No abdominopelvic ascites. Musculoskeletal: Degenerative changes seen throughout the lumbar spine. IMPRESSION: 1. 2 mm distal left ureteral calculus with mild to moderate severity left-sided hydronephrosis and hydroureter. 2. Mild left upper lobe and left basilar atelectatic changes. 3. Stable marked severity anasarca. Aortic Atherosclerosis (ICD10-I70.0). Electronically Signed   By: Virgina Norfolk M.D.   On: 09/11/2021 04:18   DG Chest Port 1 View  Result Date: 09/11/2021 CLINICAL DATA:  Central line placement EXAM: PORTABLE CHEST 1 VIEW COMPARISON:  CT chest and chest radiographs performed on the same date FINDINGS: The heart size and mediastinal contours are within normal limits. Prominence of the bronchovascular markings, likely secondary to vascular crowding due to low lung volumes. No pleural effusion or pneumothorax. No acute osseous abnormality. Right IJ access central line with distal tip in the SVC. IMPRESSION: Right IJ access central line with distal tip in the SVC, otherwise no significant interval change. Electronically Signed   By: Keane Police D.O.   On: 09/11/2021 09:52   DG Chest Port 1 View  Result Date: 09/11/2021 CLINICAL DATA:  Shortness of breath. EXAM: PORTABLE CHEST 1 VIEW COMPARISON:   August 14, 2021 FINDINGS: Low lung volumes are seen with subsequent crowding of the bronchovascular lung markings. Mild prominence of the perihilar pulmonary vasculature is also seen. There is no evidence of focal consolidation, pleural effusion or pneumothorax. The heart size and mediastinal contours are within normal limits. Degenerative changes are seen throughout the thoracic spine. IMPRESSION: Low lung volumes with mild pulmonary vascular congestion. Electronically Signed   By: Virgina Norfolk M.D.   On: 09/11/2021 03:19   DG OR UROLOGY CYSTO IMAGE ([facility] ONLY)  Result Date: 09/11/2021 There is no interpretation for this exam.  This order is for images obtained during a surgical procedure.  Please See "Surgeries" Tab for more information regarding the procedure.   '@PROBHOSP'$ @ DG OR UROLOGY CYSTO IMAGE ([facility] ONLY)  Result Date: 09/11/2021 There is no interpretation for this exam.  This order is for images obtained during a surgical procedure.  Please See "Surgeries" Tab for more information regarding the procedure.        ASSESSMENT AND PLAN    -Multidisciplinary rounds held today -Goals of care discourse with patient -patient wishes to remain full code with fully aggressive measures including ACLS and endotracheal intubation.  Patient has not been intubated in the past   Septic shock -Present on admission-source is likely urinary versus pneumonia.  CT abdomen with left hydronephrosis and hydroureter.  Chest x-ray with bilateral multifocal airspace opacification suggestive of pneumonia. -empiric Vancomycin and Cefepime -Vasopressor support - levophed and vasopressin  -Solucortef 50 QID -Procalcitonin and lactate trending -MRSA PCR -COVID negative had infection last month -IVF - judicious use due to HFpEF with abnormal CXR -Central line with CVP trending  -Urology consultation - Dr Bernardo Heater apprecia ite recommendations - ureteral stenting possible   acute on chronic diastolic  CHF -Chest x-ray with airspace and interstitial opacification caution with fluids with goal to euvolemia at this time and additional guidance with CVP trending -Bedside ultrasound assessment with virtual IVC -Lasix as post treatment and stabilization of shock physiology -BNP elevated over 400 despite morbid obesity digestive of cardiac decompensation.  This is likely due to infection with sepsis and septic shock.  Initial goal to treat underlying cause which includes infection is likely of urinary etiology -Work-up for disseminated intravascular coagulation including peripheral blood film   Moderate protein calorie malnutrition -Hypoalbuminemia with chronic deconditioning and peripheral muscle wasting -RD nutritionist consultation   history of DVT and PE - patient chronically on Xarelto -Recent GI bleed with GAVE caution with anticoagulation -There is thrombocytopenia which increases bleeding risk  Septic shock -use vasopressors to keep MAP>65 -follow ABG and LA -follow up cultures -emperic ABX -consider stress dose steroids   Remote COVID-19 pneumonia  Negative on this admission -Abnormal CXR noted may be related to residual inflammatory/fibrotic changes post COVID-19   Poorly controlled diabetes mellitus   -Recent hypoglycemia 23   -Caution with steroids and sepsis   -Consider diabetic coordinator for additional input and management of complex diabetes while acutely critically ill   Remote left lower leg cellulitis February 2023  -Improved from previous continue current antimicrobial regimen    Chronic symptomatic anemia Due to iron deficiency and B12 deficiency with GAVE -Currently acute on chronic with partial dilutional effect post IVF resuscitation  ID -continue IV abx as prescibed -follow up cultures  GI/Nutrition GI PROPHYLAXIS as indicated DIET-->TF's as tolerated Constipation protocol as indicated  ENDO - ICU hypoglycemic\Hyperglycemia  protocol -check FSBS per protocol   ELECTROLYTES -follow labs as needed -replace as needed -pharmacy consultation   DVT/GI PRX ordered -SCDs  TRANSFUSIONS AS NEEDED MONITOR FSBS ASSESS the need for LABS as needed    Critical care provider statement:   Total critical care time: 44mnutes   Performed by: ALanney GinsMD   Critical care time was exclusive of separately billable procedures and treating other patients.   Critical care was necessary to treat or prevent imminent or life-threatening deterioration.   Critical care was time spent personally by me on the following activities: development of treatment plan with patient and/or surrogate as well as nursing, discussions with consultants, evaluation of patient's response to treatment, examination of patient, obtaining history from patient or surrogate, ordering and performing treatments and interventions, ordering and review of laboratory studies, ordering and review of radiographic studies, pulse oximetry and re-evaluation of patient's condition.  Ottie Glazier, M.D.  Pulmonary & Critical Care Medicine

## 2021-09-12 NOTE — Progress Notes (Signed)
Shift Summary: ?Patient drowsy throughout shift. Denies pain. A/O x 2. Levophed infusing at 40mg/hr, inquired about Vasopressin restart parameters. CVP 10-12. Patient has coarse rales throughout lung fields with a chronic wheeze. Strong, NP cough. 2L [place]. 1L UOP. BM x 1. Sputum sample still outstanding. Echo to be completed today. Per labs patient needs K and Mag replaced pending orders from pharmacy.  ?

## 2021-09-12 NOTE — Progress Notes (Signed)
Initial Nutrition Assessment ? ?DOCUMENTATION CODES:  ? ?Morbid obesity ? ?INTERVENTION:  ? ?Ensure Max protein supplement BID, each supplement provides 150kcal and 30g of protein. ? ?MVI po daily  ? ?Liberalize diet  ? ?NUTRITION DIAGNOSIS:  ? ?Inadequate oral intake related to acute illness as evidenced by per patient/family report. ? ?GOAL:  ? ?Patient will meet greater than or equal to 90% of their needs ? ?MONITOR:  ? ?PO intake, Supplement acceptance, Labs, Weight trends, Skin, I & O's ? ?REASON FOR ASSESSMENT:  ? ?Consult ?Assessment of nutrition requirement/status ? ?ASSESSMENT:  ? ?66 y.o. female with medical history significant of DVT/PE on Xarelto, hypertension, hyperlipidemia, diabetes mellitus, anemia, obesity, endometrial cancer, dCHF, morbid obesity, cellulitis, sepsis, anemia, GI bleed at splenic flexure s/p endovascular embolization on 1/11 and COVID 19 (feb) who is admitted with septic shock. ? ?Met with pt in room today. Pt is known to this RD from previous admits. Pt reports decreased appetite and oral intake for the past month. Per chart, pt has lost 16lbs(7%) since having COVID in February; this is significant weight loss. Pt reports that she has not been drinking any supplements at home but she is willing to drink chocolate Ensure in hospital. Pt reports eating 100% of her breakfast this morning. RD will add supplements and MVI to help pt meet her estimated needs. RD will also liberalize pt's diet as the heart healthy diet is restrictive of protein.  ? ?Medications reviewed and include: heparin, insulin, solu-cortef, protonix, ceftriaxone, levophed, KCl ? ?Labs reviewed: K 3.3(L), BUN 30(H), P 3.4 wnl, Mg 1.5(L) ?Wbc- 25.0(H), Hgb 8.1(L), Hct 26.9(L) ?Cbgs- 181, 176, 142 x 24 hrs ?AIC 6.0- 2/8 ? ?NUTRITION - FOCUSED PHYSICAL EXAM: ? ?Flowsheet Row Most Recent Value  ?Orbital Region No depletion  ?Upper Arm Region Moderate depletion  ?Thoracic and Lumbar Region No depletion  ?Buccal Region No  depletion  ?Temple Region Moderate depletion  ?Clavicle Bone Region No depletion  ?Clavicle and Acromion Bone Region No depletion  ?Scapular Bone Region No depletion  ?Dorsal Hand Moderate depletion  ?Patellar Region Unable to assess  ?Anterior Thigh Region Unable to assess  ?Posterior Calf Region Unable to assess  ?Edema (RD Assessment) Moderate  ?Hair Reviewed  ?Eyes Reviewed  ?Mouth Reviewed  ?Skin Reviewed  ?Nails Reviewed  ? ?Diet Order:   ?Diet Order   ? ?       ?  Diet Carb Modified Fluid consistency: Thin; Room service appropriate? Yes  Diet effective now       ?  ? ?  ?  ? ?  ? ?EDUCATION NEEDS:  ? ?Education needs have been addressed ? ?Skin:  Skin Assessment: Reviewed RN Assessment (wound arm, incision perineum) ? ?Last BM:  3/8- TYPE 7 ? ?Height:  ? ?Ht Readings from Last 1 Encounters:  ?09/11/21 4' 10"  (1.473 m)  ? ? ?Weight:  ? ?Wt Readings from Last 1 Encounters:  ?09/12/21 96.7 kg  ? ? ?Ideal Body Weight:  44 kg ? ?BMI:  Body mass index is 44.56 kg/m?. ? ?Estimated Nutritional Needs:  ? ?Kcal:  2100-2400kcal/day ? ?Protein:  105-120g/day ? ?Fluid:  1.4-1.6L/day ? ?Koleen Distance MS, RD, LDN ?Please refer to [facility]ION for RD and/or RD on-call/weekend/after hours pager ? ?

## 2021-09-12 NOTE — Progress Notes (Signed)
*  PRELIMINARY RESULTS* ?Echocardiogram ?2D Echocardiogram has been performed. ? ?Courtney Mack ?09/12/2021, 11:06 AM ?

## 2021-09-12 NOTE — Anesthesia Postprocedure Evaluation (Signed)
Anesthesia Post Note ? ?Patient: Courtney Mack ? ?Procedure(s) Performed: CYSTOSCOPY WITH RETROGRADE PYELOGRAM/URETERAL STENT PLACEMENT (Left) ? ?Patient location during evaluation: SICU ?Anesthesia Type: General ?Level of consciousness: awake ?Pain management: pain level controlled ?Vital Signs Assessment: post-procedure vital signs reviewed and stable ?Respiratory status: patient connected to nasal cannula oxygen ?Cardiovascular status: stable (Pt remains on continuous BP medicine) ?Postop Assessment: no apparent nausea or vomiting ?Anesthetic complications: no ? ? ?No notable events documented. ? ? ?Last Vitals:  ?Vitals:  ? 09/12/21 0700 09/12/21 0745  ?BP: (!) 105/52   ?Pulse: 90   ?Resp: 19   ?Temp:    ?SpO2: 97% 95%  ?  ?Last Pain:  ?Vitals:  ? 09/12/21 0335  ?TempSrc: Axillary  ?PainSc:   ? ? ?  ?  ?  ?  ?  ?  ? ?Rosebud Poles C ? ? ? ? ?

## 2021-09-12 NOTE — NC FL2 (Signed)
?[place] MEDICAID FL2 LEVEL OF CARE SCREENING TOOL  ?  ? ?IDENTIFICATION  ?Patient Name: ?Courtney Mack Birthdate: 10/29/1955 Sex: female Admission Date (Current Location): ?09/11/2021  ?[place] and [place] Number: ? [place] ?  Facility and Address:  ?[facility], [address], [place], [place] [address] ?     Provider Number: ?[id number]  ?Attending Physician Name and Address:  ?Ottie Glazier, MD ? Relative Name and Phone Number:  ?Eula Flax- daughter- [phone] ?   ?Current Level of Care: ?Hospital Recommended Level of Care: ?[place] Prior Approval Number: ?  ? ?Date Approved/Denied: ?  PASRR Number: ?[id number] A ? ?Discharge Plan: ?SNF ?  ? ?Current Diagnoses: ?Patient Active Problem List  ? Diagnosis Date Noted  ? Septic shock ([place]) 09/11/2021  ? COVID-19   ? [place] 07/18/2021  ? Acute on chronic blood loss anemia 07/18/2021  ? Pressure injury of skin 06/21/2021  ? Symptomatic anemia 06/20/2021  ? Sepsis ([place]) 06/20/2021  ? HLD (hyperlipidemia) 06/20/2021  ? Fall at home, initial encounter 06/20/2021  ? Cough 06/20/2021  ? Chronic diastolic CHF (congestive heart failure) ([place]) 06/20/2021  ? GI bleeding 06/20/2021  ? Cellulitis of left lower leg 06/20/2021  ? Acute bilateral deep vein thrombosis (DVT) of femoral veins ([place]) 01/02/2021  ? DOE (dyspnea on exertion) 11/10/2020  ? Risk factors for obstructive sleep apnea 11/10/2020  ? Uterine cancer ([place]) 11/08/2020  ? IDA (iron deficiency anemia) 09/21/2020  ? Endometrial cancer ([place]) 09/13/2020  ? Acute blood loss anemia 09/06/2020  ? Vaginal bleeding 09/06/2020  ? DVT (deep venous thrombosis) ([place]) 09/06/2020  ? Acute massive pulmonary embolism ([place]) 08/25/2020  ? Anemia, unspecified 08/24/2020  ? Morbidly obese ([place]) 08/23/2020  ? Essential hypertension 08/23/2020  ? Diabetes mellitus without complication ([place]) 00/93/8182  ? Dyslipidemia 08/23/2020  ? Acute respiratory failure with hypoxia ([place])   ?  Pulmonary embolism ([place]) 08/18/2020  ? Vaginal bleeding, abnormal 08/18/2020  ? Obesity, morbid, BMI 50 or higher ([place]) 08/18/2020  ? ? ?Orientation RESPIRATION BLADDER Height & Weight   ?  ?Self, Time, Situation, Place ? Normal Continent, Indwelling catheter Weight: 96.7 kg ?Height:  '4\' 10"'$  (147.3 cm)  ?BEHAVIORAL SYMPTOMS/MOOD NEUROLOGICAL BOWEL NUTRITION STATUS  ?    Continent Diet (Carb Modified 1600-2000 cal per day)  ?AMBULATORY STATUS COMMUNICATION OF NEEDS Skin   ?Limited Assist Verbally Skin abrasions, Bruising (scattered arms and legs) ?  ?  ?  ?    ?     ?     ? ? ?Personal Care Assistance Level of Assistance  ?Bathing, Feeding, Dressing Bathing Assistance: Limited assistance ?Feeding assistance: Limited assistance ?Dressing Assistance: Limited assistance ?   ? ?Functional Limitations Info  ?Sight, Hearing, Speech Sight Info: Impaired (Glasses) ?Hearing Info: Adequate ?Speech Info: Adequate  ? ? ?SPECIAL CARE FACTORS FREQUENCY  ?PT (By licensed PT), OT (By licensed OT)   ?  ?PT Frequency: 5 days per week ?OT Frequency: 5 days per week ?  ?  ?  ?   ? ? ?Contractures Contractures Info: Not present  ? ? ?Additional Factors Info  ?Code Status, Allergies, Isolation Precautions Code Status Info: Full ?Allergies Info: NKA ?  ?  ?Isolation Precautions Info: Contact- MRSA ?   ? ?Current Medications (09/12/2021):  This is the current hospital active medication list ?Current Facility-Administered Medications  ?Medication Dose Route Frequency Provider Last Rate Last Admin  ? acetaminophen (TYLENOL) tablet 650 mg  650 mg Oral Q6H PRN Bradly Bienenstock,  NP   650 mg at 09/11/21 0846  ? cefTRIAXone (ROCEPHIN) 2 g in sodium chloride 0.9 % 100 mL IVPB  2 g Intravenous Q24H Ottie Glazier, MD   Stopped at 09/12/21 [phone]  ? Chlorhexidine Gluconate Cloth 2 % PADS 6 each  6 each Topical Q0600 Ottie Glazier, MD   6 each at 09/12/21 [phone]  ? docusate sodium (COLACE) capsule 100 mg  100 mg Oral BID PRN Tukov-Yual, Magdalene S, NP       ? heparin injection 5,000 Units  5,000 Units Subcutaneous Q8H Ottie Glazier, MD   5,000 Units at 09/12/21 1433  ? hydrocortisone sodium succinate (SOLU-CORTEF) 100 MG injection 100 mg  100 mg Intravenous Q12H Darel Hong D, NP   100 mg at 09/12/21 2993  ? insulin aspart (novoLOG) injection 0-9 Units  0-9 Units Subcutaneous TID WC Ottie Glazier, MD   1 Units at 09/12/21 1719  ? ipratropium (ATROVENT) nebulizer solution 0.5 mg  0.5 mg Nebulization Q6H Darel Hong D, NP   0.5 mg at 09/12/21 1450  ? levalbuterol (XOPENEX) nebulizer solution 1.25 mg  1.25 mg Nebulization Q6H Darel Hong D, NP   1.25 mg at 09/12/21 1450  ? loperamide (IMODIUM) capsule 2 mg  2 mg Oral PRN Ottie Glazier, MD   2 mg at 09/12/21 1156  ? [START ON 09/13/2021] multivitamin with minerals tablet 1 tablet  1 tablet Oral Daily Ottie Glazier, MD      ? nitroGLYCERIN (NITROGLYN) 2 % ointment 1 inch  1 inch Topical Q8H Aleskerov, Fuad, MD   1 inch at 09/11/21 1305  ? norepinephrine (LEVOPHED) '4mg'$  in 258m (0.016 mg/mL) premix infusion  0-40 mcg/min Intravenous Continuous MRada Hay MD   Stopped at 09/12/21 [phone] ? ondansetron (ZOFRAN) injection 4 mg  4 mg Intravenous Q6H PRN KBradly Bienenstock NP      ? pantoprazole (PROTONIX) EC tablet 40 mg  40 mg Oral Daily KDarel HongD, NP   40 mg at 09/12/21 06789 ? polyethylene glycol (MIRALAX / GLYCOLAX) packet 17 g  17 g Oral Daily PRN Tukov-Yual, Magdalene S, NP      ? protein supplement (ENSURE MAX) liquid  11 oz Oral BID AOttie Glazier MD   11 oz at 09/12/21 1433  ? sodium chloride flush (NS) 0.9 % injection 10-40 mL  10-40 mL Intracatheter Q12H AOttie Glazier MD   30 mL at 09/12/21 0958  ? sodium chloride flush (NS) 0.9 % injection 10-40 mL  10-40 mL Intracatheter PRN AOttie Glazier MD      ? vasopressin (PITRESSIN) 20 Units in sodium chloride 0.9 % 100 mL infusion-*FOR SHOCK*  0-0.03 Units/min Intravenous Continuous KBradly Bienenstock NP   Stopped at 09/11/21 1908   ? ? ? ?Discharge Medications: ?Please see discharge summary for a list of discharge medications. ? ?Relevant Imaging Results: ? ?Relevant Lab Results: ? ? ?Additional Information ?SS#: [id number]? ?JShelbie Hutching RN ? ? ? ? ?

## 2021-09-12 NOTE — Progress Notes (Signed)
Urology Consult Follow Up  Subjective: POD # 1 - left ureteral stent placement   She is currently receiving a breathing treatment.  She states she feels better and does not have any pain or discomfort.  She is afebrile, on pressers with good urinary output.  Urine is clear yellow in the Foley tubing.  WBC count is 25.0 up from 11.9 yesterday likely due to stent manipulation serum creatinine 0.93 which is down from 1.26 yesterday and lactic acid is normal at 1.7  Anti-infectives: Anti-infectives (From admission, onward)    Start     Dose/Rate Route Frequency Ordered Stop   09/12/21 0800  cefTRIAXone (ROCEPHIN) 2 g in sodium chloride 0.9 % 100 mL IVPB        2 g 200 mL/hr over 30 Minutes Intravenous Every 24 hours 09/11/21 1737     09/12/21 0400  vancomycin (VANCOCIN) IVPB 1000 mg/200 mL premix  Status:  Discontinued        1,000 mg 200 mL/hr over 60 Minutes Intravenous Every 24 hours 09/11/21 0903 09/11/21 1737   09/11/21 2200  ceFEPIme (MAXIPIME) 2 g in sodium chloride 0.9 % 100 mL IVPB  Status:  Discontinued        2 g 200 mL/hr over 30 Minutes Intravenous Every 12 hours 09/11/21 1308 09/11/21 1737   09/11/21 1800  ceFEPIme (MAXIPIME) 2 g in sodium chloride 0.9 % 100 mL IVPB        2 g 200 mL/hr over 30 Minutes Intravenous Every 12 hours 09/11/21 1737 09/11/21 1916   09/11/21 1000  ceFEPIme (MAXIPIME) 2 g in sodium chloride 0.9 % 100 mL IVPB  Status:  Discontinued        2 g 200 mL/hr over 30 Minutes Intravenous Every 8 hours 09/11/21 0854 09/11/21 1308   09/11/21 0215  ceFEPIme (MAXIPIME) 2 g in sodium chloride 0.9 % 100 mL IVPB        2 g 200 mL/hr over 30 Minutes Intravenous  Once 09/11/21 0208 09/11/21 0338   09/11/21 0215  metroNIDAZOLE (FLAGYL) IVPB 500 mg        500 mg 100 mL/hr over 60 Minutes Intravenous  Once 09/11/21 0208 09/11/21 0255   09/11/21 0215  vancomycin (VANCOCIN) IVPB 1000 mg/200 mL premix  Status:  Discontinued        1,000 mg 200 mL/hr over 60 Minutes  Intravenous  Once 09/11/21 0208 09/11/21 0211   09/11/21 0215  vancomycin (VANCOREADY) IVPB 2000 mg/400 mL        2,000 mg 200 mL/hr over 120 Minutes Intravenous  Once 09/11/21 0211 09/11/21 0510       Current Facility-Administered Medications  Medication Dose Route Frequency Provider Last Rate Last Admin   acetaminophen (TYLENOL) tablet 650 mg  650 mg Oral Q6H PRN Darel Hong D, NP   650 mg at 09/11/21 0846   cefTRIAXone (ROCEPHIN) 2 g in sodium chloride 0.9 % 100 mL IVPB  2 g Intravenous Q24H Ottie Glazier, MD   Stopped at 09/12/21 5409   Chlorhexidine Gluconate Cloth 2 % PADS 6 each  6 each Topical Q0600 Ottie Glazier, MD   6 each at 09/11/21 1022   docusate sodium (COLACE) capsule 100 mg  100 mg Oral BID PRN Tukov-Yual, Magdalene S, NP       hydrocortisone sodium succinate (SOLU-CORTEF) 100 MG injection 100 mg  100 mg Intravenous Q12H Darel Hong D, NP   100 mg at 09/12/21 0812   ipratropium (ATROVENT) nebulizer solution 0.5 mg  0.5 mg Nebulization Q6H Darel Hong D, NP   0.5 mg at 09/12/21 0745   levalbuterol (XOPENEX) nebulizer solution 1.25 mg  1.25 mg Nebulization Q6H Darel Hong D, NP   1.25 mg at 09/12/21 0745   magnesium sulfate IVPB 2 g 50 mL  2 g Intravenous Once Lorna Dibble, RPH       nitroGLYCERIN (NITROGLYN) 2 % ointment 1 inch  1 inch Topical Q8H Ottie Glazier, MD   1 inch at 09/11/21 1305   norepinephrine (LEVOPHED) '4mg'$  in 275m (0.016 mg/mL) premix infusion  0-40 mcg/min Intravenous Continuous MRada Hay MD 37.5 mL/hr at 09/12/21 0859 10 mcg/min at 09/12/21 0859   ondansetron (ZOFRAN) injection 4 mg  4 mg Intravenous Q6H PRN KBradly Bienenstock NP       pantoprazole (PROTONIX) EC tablet 40 mg  40 mg Oral Daily KDarel HongD, NP   40 mg at 09/11/21 0929   polyethylene glycol (MIRALAX / GLYCOLAX) packet 17 g  17 g Oral Daily PRN Tukov-Yual, Magdalene S, NP       potassium chloride 10 mEq in 100 mL IVPB  10 mEq Intravenous Q1 Hr x 4 Beers,  Brandon D, RPH       sodium chloride flush (NS) 0.9 % injection 10-40 mL  10-40 mL Intracatheter Q12H AOttie Glazier MD   30 mL at 09/11/21 2257   sodium chloride flush (NS) 0.9 % injection 10-40 mL  10-40 mL Intracatheter PRN AOttie Glazier MD       vasopressin (PITRESSIN) 20 Units in sodium chloride 0.9 % 100 mL infusion-*FOR SHOCK*  0-0.03 Units/min Intravenous Continuous KBradly Bienenstock NP   Stopped at 09/11/21 1908     Objective: Vital signs in last 24 hours: Temp:  [98 F (36.7 C)-99.8 F (37.7 C)] 98 F (36.7 C) (03/08 0800) Pulse Rate:  [81-122] 95 (03/08 0845) Resp:  [18-36] 25 (03/08 0845) BP: (86-143)/(47-77) 125/61 (03/08 0845) SpO2:  [72 %-100 %] 94 % (03/08 0845) Weight:  [95.1 kg-96.7 kg] 96.7 kg (03/08 0335)  Intake/Output from previous day: 03/07 0701 - 03/08 0700 In: 3658.1 [I.V.:3558.1; IV Piggyback:100.1] Out: 1025 [Urine:1025] Intake/Output this shift: Total I/O In: 227.3 [I.V.:127.3; IV Piggyback:100] Out: -    Physical Exam HENT:     Head: Normocephalic.     Nose: Nose normal.     Mouth/Throat:     Comments: Patient receiving breathing treatment  Eyes:     Pupils: Pupils are equal, round, and reactive to light.  Abdominal:     Palpations: Abdomen is soft.  Musculoskeletal:     Cervical back: Normal range of motion.  Skin:    General: Skin is warm and dry.  Neurological:     General: No focal deficit present.     Mental Status: She is alert.  Psychiatric:        Mood and Affect: Mood normal.    Lab Results:  Recent Labs    09/11/21 0848 09/12/21 0455  WBC 11.9* 25.0*  HGB 8.5* 8.1*  HCT 28.0* 26.9*  PLT 123* 126*   BMET Recent Labs    09/11/21 0222 09/11/21 0848 09/12/21 0455  NA 139  --  137  K 3.9  --  3.3*  CL 112*  --  106  CO2 19*  --  23  GLUCOSE 103*  --  208*  BUN 20  --  30*  CREATININE 0.97 1.26* 0.93  CALCIUM 7.7*  --  8.7*   PT/INR Recent  Labs    09/11/21 0222  LABPROT 16.1*  INR 1.3*   ABG No  results for input(s): PHART, HCO3 in the last 72 hours.  Invalid input(s): PCO2, PO2  Studies/Results: CT CHEST ABDOMEN PELVIS W CONTRAST  Result Date: 09/11/2021 CLINICAL DATA:  Fever with nausea, vomiting and diarrhea. EXAM: CT CHEST, ABDOMEN, AND PELVIS WITH CONTRAST TECHNIQUE: Multidetector CT imaging of the chest, abdomen and pelvis was performed following the standard protocol during bolus administration of intravenous contrast. RADIATION DOSE REDUCTION: This exam was performed according to the departmental dose-optimization program which includes automated exposure control, adjustment of the mA and/or kV according to patient size and/or use of iterative reconstruction technique. CONTRAST:  162m OMNIPAQUE IOHEXOL 300 MG/ML  SOLN COMPARISON:  August 14, 2021 FINDINGS: CT CHEST FINDINGS Cardiovascular: There is mild to moderate severity calcification of the aortic arch, without evidence of aortic aneurysm or dissection. Normal heart size. A very small amount of pericardial fluid is noted. Mediastinum/Nodes: No enlarged mediastinal, hilar, or axillary lymph nodes. Thyroid gland, trachea, and esophagus demonstrate no significant findings. Lungs/Pleura: Mild atelectasis is seen along the anteromedial aspect of the left upper lobe and posterior aspect of the left lung base. There is no evidence of a pleural effusion or pneumothorax. Musculoskeletal: Degenerative changes are seen throughout the thoracic spine CT ABDOMEN PELVIS FINDINGS Hepatobiliary: No focal liver abnormality is seen. No gallstones, gallbladder wall thickening, or biliary dilatation. Pancreas: Unremarkable. No pancreatic ductal dilatation or surrounding inflammatory changes. Spleen: Normal in size without focal abnormality. Adrenals/Urinary Tract: Adrenal glands are unremarkable. Kidneys are normal in size, without focal lesions. Mild asymmetric renal cortical enhancement is seen on the left. There is mild to moderate severity left-sided  hydronephrosis and hydroureter. This is new when compared to the prior study. A 2 mm left ureteral calculus is noted within the distal left ureter (axial CT image 107, CT series 2), proximal to the left UVJ. Mild inflammatory fat stranding is seen surrounding the urinary bladder which is poorly distended and subsequently limited in evaluation. Stomach/Bowel: Stomach is within normal limits. Appendix appears normal. No evidence of bowel wall thickening, distention, or inflammatory changes. Vascular/Lymphatic: Aortic atherosclerosis with marked severity calcification along the origin of the bilateral renal arteries. An inferior vena cava filter is in place. A stable appearing cluster of para-aortic lymph nodes is seen just below the level of the left renal artery. The largest measures approximately 1.4 cm. Reproductive: Uterus and bilateral adnexa are unremarkable. Other: Marked severity anasarca is seen along the lateral aspects of the abdominal and pelvic walls. No abdominopelvic ascites. Musculoskeletal: Degenerative changes seen throughout the lumbar spine. IMPRESSION: 1. 2 mm distal left ureteral calculus with mild to moderate severity left-sided hydronephrosis and hydroureter. 2. Mild left upper lobe and left basilar atelectatic changes. 3. Stable marked severity anasarca. Aortic Atherosclerosis (ICD10-I70.0). Electronically Signed   By: TVirgina NorfolkM.D.   On: 09/11/2021 04:18   DG Chest Port 1 View  Result Date: 09/11/2021 CLINICAL DATA:  Central line placement EXAM: PORTABLE CHEST 1 VIEW COMPARISON:  CT chest and chest radiographs performed on the same date FINDINGS: The heart size and mediastinal contours are within normal limits. Prominence of the bronchovascular markings, likely secondary to vascular crowding due to low lung volumes. No pleural effusion or pneumothorax. No acute osseous abnormality. Right IJ access central line with distal tip in the SVC. IMPRESSION: Right IJ access central line  with distal tip in the SVC, otherwise no significant interval change. Electronically Signed  By: Keane Police D.O.   On: 09/11/2021 09:52   DG Chest Port 1 View  Result Date: 09/11/2021 CLINICAL DATA:  Shortness of breath. EXAM: PORTABLE CHEST 1 VIEW COMPARISON:  August 14, 2021 FINDINGS: Low lung volumes are seen with subsequent crowding of the bronchovascular lung markings. Mild prominence of the perihilar pulmonary vasculature is also seen. There is no evidence of focal consolidation, pleural effusion or pneumothorax. The heart size and mediastinal contours are within normal limits. Degenerative changes are seen throughout the thoracic spine. IMPRESSION: Low lung volumes with mild pulmonary vascular congestion. Electronically Signed   By: Virgina Norfolk M.D.   On: 09/11/2021 03:19   DG OR UROLOGY CYSTO IMAGE ([facility] ONLY)  Result Date: 09/11/2021 There is no interpretation for this exam.  This order is for images obtained during a surgical procedure.  Please See "Surgeries" Tab for more information regarding the procedure.     Assessment: 66 year old female with multiple comorbidities including DVT/PEs, CHF and diabetes who has been admitted for sepsis and underwent emergent left ureteral stent placement for 2 mm left distal calculus on September 11, 2021 with Dr. Bernardo Heater.  Gram stain from urine specimen collected from the renal pelvis is positive for gram-negative rods.  Jump in WBC count likely due to manipulation by placement of ureteral stent along with pneumonia  Plan: -Okay to discontinue Foley once afebrile for 24 hours -Continue broad-spectrum antibiotics -Continue to follow urine culture as it is still pending -Continue to trend CBC and serum creatinine -will need 10 days of culture appropriate antibiotics  -strain urine as there is a high probability a 2 mm left distal stone will pass after the stent has been placed -will need outpatient ureteroscopy in one to two weeks if stone is  still present and stent exchange   LOS: 1 day    [facility] 09/12/2021

## 2021-09-12 NOTE — Consult Note (Signed)
Consultation Note Date: 09/12/2021   Patient Name: [name]  DOB: 1956-02-07  MRN: [id number]  Age / Sex: 66 y.o., female  PCP: [facility] [place] Referring Physician: Ottie Glazier, MD  Reason for Consultation: Establishing goals of care  HPI/Patient Profile: 66 y.o. female  with past medical history of obesity, DVT/PE on Xarelto, HTN/HLD, DM 2, anemia, endometrial cancer, D HF, cellulitis, sepsis, anemia, GI bleed at splenic flexure status post endovascular embolization sedation on 1/11 and COVID in February admitted on 09/11/2021 with septic shock.   Clinical Assessment and Goals of Care: I have reviewed medical records including EPIC notes, labs and imaging, received report from RN, assessed the patient and then met at the bedside to discuss diagnosis prognosis, GOC, EOL wishes, disposition and options.   I introduced Palliative Medicine as specialized medical care for people living with serious illness. It focuses on providing relief from the symptoms and stress of a serious illness. The goal is to improve quality of life for both the patient and the family.  We discussed a brief life review of the patient.  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  She is divorced and has 2 daughters.  Her daughter [name] lives in the family home with her.    We then focused on their current illness.  Overall, Courtney Mack seems knowledgeable about her acute and chronic health concerns.  She shares that she had surgery to fix her GI bleed during her recent hospital stay.  She seems knowledgeable about her kidney stenting.  The natural disease trajectory and expectations at EOL were discussed.  Advanced directives, concepts specific to code status, were considered and discussed.  At this point Courtney Mack will remain full scope/full code until she can have detailed discussions  with her daughters.  Palliative Care services outpatient were explained and offered.  Courtney Mack is agreeable to outpatient palliative services.  Provider choice discussed.  She is agreeable to [facility] to continue to goals of care discussions.  Discussed the importance of continued conversation with family and the medical providers regarding overall plan of care and treatment options, ensuring decisions are within the context of the patients values and GOCs.  Questions and concerns were addressed.  The family was encouraged to call with questions or concerns.  PMT will continue to support holistically.   HCPOA   NEXT OF KIN -Courtney Mack names her daughters [name] and [name] as her healthcare surrogate.  She and [name] live together in the family home.    SUMMARY OF RECOMMENDATIONS   At this point full scope/full code Time for outcomes Agreeable to short-term rehab but only if she can go to [place] with [facility]   Code Status/Advance Care Planning: Full code -we talked about the concept of "treat the treatable, but allowing natural passing".  I encourage Courtney Mack to talk about these choices with her daughters.  She tells me that if she is dead, to not do life support, but if  there is a chance she could recover she would want attempted resuscitation.  Symptom Management:  Per hospitalist, no additional needs at this time.  Palliative Prophylaxis:  Frequent Pain Assessment, Oral Care, and Palliative Wound Care  Additional Recommendations (Limitations, Scope, Preferences): Full Scope Treatment  Psycho-social/Spiritual:  Desire for further Chaplaincy support:no Additional Recommendations: Caregiving  Support/Resources  Prognosis:  Unable to determine, based on outcomes.  6 months or less would not be surprising based on chronic illness burden, decreasing functional status, 4 hospital stays in the last 6 months.  Discharge Planning:  Anticipate  home with home health, agreeable to outpatient palliative services with [facility].       Primary Diagnoses: Present on Admission:  Septic shock ([place])   I have reviewed the medical record, interviewed the patient and family, and examined the patient. The following aspects are pertinent.  Past Medical History:  Diagnosis Date   Diabetes mellitus without complication (HCC)    DVT (deep venous thrombosis) ([place]) 09/06/2020   High cholesterol    Hx of blood clots    Hypertension    IDA (iron deficiency anemia) 09/21/2020   Social History   Socioeconomic History   Marital status: Single    Spouse name: Not on file   Number of children: Not on file   Years of education: Not on file   Highest education level: Not on file  Occupational History   Not on file  Tobacco Use   Smoking status: Former    Packs/day: 0.25    Years: 0.50    Pack years: 0.13    Types: Cigarettes    Quit date: 09/21/1980    Years since quitting: 41.0   Smokeless tobacco: Never   Tobacco comments:    smoked 2-4 cigarettes for about 2-3 weeks   Vaping Use   Vaping Use: Never used  Substance and Sexual Activity   Alcohol use: Not Currently   Drug use: Not Currently   Sexual activity: Not on file  Other Topics Concern   Not on file  Social History Narrative   Not on file   Social Determinants of Health   Financial Resource Strain: Not on file  Food Insecurity: Not on file  Transportation Needs: Not on file  Physical Activity: Not on file  Stress: Not on file  Social Connections: Not on file   Family History  Problem Relation Age of Onset   Hypertension Mother    Diabetes Mother    Diabetes Father    Hypertension Father    High Cholesterol Father    Congestive Heart Failure Father    Breast cancer Cousin    Scheduled Meds:  Chlorhexidine Gluconate Cloth  6 each Topical Q0600   heparin injection (subcutaneous)  5,000 Units Subcutaneous Q8H   hydrocortisone sod succinate (SOLU-CORTEF) inj   100 mg Intravenous Q12H   insulin aspart  0-9 Units Subcutaneous TID WC   ipratropium  0.5 mg Nebulization Q6H   levalbuterol  1.25 mg Nebulization Q6H   [START ON 09/13/2021] multivitamin with minerals  1 tablet Oral Daily   nitroGLYCERIN  1 inch Topical Q8H   pantoprazole  40 mg Oral Daily   Ensure Max Protein  11 oz Oral BID   sodium chloride flush  10-40 mL Intracatheter Q12H   Continuous Infusions:  cefTRIAXone (ROCEPHIN)  IV Stopped (09/12/21 0842)   norepinephrine (LEVOPHED) Adult infusion Stopped (09/12/21 0948)   vasopressin Stopped (09/11/21 1908)   PRN Meds:.acetaminophen, docusate sodium, loperamide, ondansetron (ZOFRAN) IV,  polyethylene glycol, sodium chloride flush Medications Prior to Admission:  Prior to Admission medications   Medication Sig Start Date End Date Taking? Authorizing Provider  DULERA 100-5 MCG/ACT AERO Inhale 2 puffs into the lungs 2 (two) times daily. 11/22/20  Yes [provider]  Ferrous Sulfate (IRON) 325 (65 Fe) MG TABS Take 325 mg by mouth every other day. 07/05/21  Yes [provider]  losartan (COZAAR) 25 MG tablet Take 1 tablet (25 mg total) by mouth daily. 08/18/21  Yes Fritzi Mandes, MD  Multiple Vitamin (MULTIVITAMIN WITH MINERALS) TABS tablet Take 1 tablet by mouth daily. 09/12/20  Yes [name], MD  pantoprazole (PROTONIX) 40 MG tablet Take 1 tablet (40 mg total) by mouth daily. 07/23/21 09/12/21 Yes Sreenath, Sudheer B, MD  rivaroxaban (XARELTO) 20 MG TABS tablet Take 1 tablet (20 mg total) by mouth daily with supper. 08/21/21  Yes Fritzi Mandes, MD  rosuvastatin (CRESTOR) 10 MG tablet Take 1 tablet (10 mg total) by mouth daily. 10/03/20  Yes Earlie Server, MD  vitamin B-12 1000 MCG tablet Take 1 tablet (1,000 mcg total) by mouth daily. 08/19/21  Yes Fritzi Mandes, MD  loperamide (IMODIUM) 2 MG capsule Take 1 capsule (2 mg total) by mouth as needed for diarrhea or loose stools. 06/26/21   Sidney Ace, MD  simethicone (MYLICON) 80  MG chewable tablet Chew 1 tablet (80 mg total) by mouth 4 (four) times daily. 06/26/21   Sidney Ace, MD   No Known Allergies Review of Systems  Unable to perform ROS: Other   Physical Exam Vitals and nursing note reviewed.  Constitutional:      General: She is not in acute distress.    Appearance: She is obese. She is ill-appearing.  Cardiovascular:     Rate and Rhythm: Normal rate.  Pulmonary:     Effort: Pulmonary effort is normal. No respiratory distress.  Skin:    General: Skin is warm and dry.  Neurological:     Mental Status: She is alert and oriented to person, place, and time.  Psychiatric:        Mood and Affect: Mood normal.        Behavior: Behavior normal.    Vital Signs: BP (!) 93/54    Pulse 87    Temp 98.9 F (37.2 C) (Oral)    Resp (!) 28    Ht 4' 10"  (1.473 m)    Wt 96.7 kg    SpO2 93%    BMI 44.56 kg/m  Pain Scale: 0-10   Pain Score: 0-No pain   SpO2: SpO2: 93 % O2 Device:SpO2: 93 % O2 Flow Rate: .O2 Flow Rate (L/min): 2 L/min  IO: Intake/output summary:  Intake/Output Summary (Last 24 hours) at 09/12/2021 1502 Last data filed at 09/12/2021 1413 Gross per 24 hour  Intake 3203.12 ml  Output 1675 ml  Net 1528.12 ml    LBM: Last BM Date : 09/12/21 Baseline Weight: Weight: 120.2 kg Most recent weight: Weight: 96.7 kg     Palliative Assessment/Data:   Flowsheet Rows    Flowsheet Row Most Recent Value  Intake Tab   Referral Department Hospitalist  Unit at Time of Referral Intermediate Care Unit  Palliative Care Primary Diagnosis Other (Comment)  Date Notified 09/12/21  Palliative Care Type New Palliative care  Reason for referral Clarify Goals of Care  Date of Admission 09/11/21  Date first seen by Palliative Care 09/12/21  # of days Palliative referral response time 0 Day(s)  #  of days IP prior to Palliative referral 1  Clinical Assessment   Palliative Performance Scale Score 40%  Pain Max last 24 hours Not able to report  Pain Min  Last 24 hours Not able to report  Dyspnea Max Last 24 Hours Not able to report  Dyspnea Min Last 24 hours Not able to report  Psychosocial & Spiritual Assessment   Palliative Care Outcomes        Time In: 1400 Time Out: 1455 Time Total: 55 minutes  Greater than 50%  of this time was spent counseling and coordinating care related to the above assessment and plan.  Signed by: Drue Novel, NP   Please contact Palliative Medicine Team phone at [phone] for questions and concerns.  For individual provider: See Shea Evans

## 2021-09-12 NOTE — TOC Progression Note (Signed)
Transition of Care (TOC) - Progression Note  ? ? ?Patient Details  ?Name: Courtney Mack ?MRN: [id number] ?Date of Birth: 1956-04-07 ? ?Transition of Care (TOC) CM/SW Contact  ?Shelbie Hutching, RN ?Phone Number: ?09/12/2021, 5:34 PM ? ?Clinical Narrative:    ?Met with patient at the bedside, she is sitting up in a chair.  Patient reports doing well at home with daughter and granddaughter until about 2 days ago.  She was able to get around with a walker.   ?PT is currently recommending SNF and patient will be open to short term rehab but only if she can go to [facility].   RNCM will send bed request to [facility].  ? ? ?Expected Discharge Plan: [employer] ?Barriers to Discharge: Continued Medical Work up ? ?Expected Discharge Plan and Services ?Expected Discharge Plan: [place] ?  ?Discharge Planning Services: CM Consult ?Post Acute Care Choice: [place] ?Living arrangements for the past 2 months: [place] ?                ?DME Arranged: N/A ?DME Agency: NA ?  ?  ?  ?HH Arranged: PT, OT, RN ?[place] Agency: [place] ([place]) ?Date HH Agency Contacted: 09/11/21 ?Time [place]: 5993 ?Representative spoke with at [place]: Floydene Flock ? ? ?Social Determinants of Health (SDOH) Interventions ?  ? ?Readmission Risk Interventions ?Readmission Risk Prevention Plan 09/12/2021 09/11/2021 08/16/2021  ?Transportation Screening Complete Complete Complete  ?PCP or Specialist Appt within 3-5 Days - - -  ?[place] or [place] - - -  ?Social Work Consult for [place] Planning/Counseling - - -  ?Palliative Care Screening - - -  ?Medication Review Press photographer) - Complete Complete  ?PCP or Specialist appointment within 3-5 days of discharge - Complete Complete  ?[place] or Home Care Consult - Complete Complete  ?SW Recovery Care/Counseling Consult - Complete Complete  ?Palliative Care Screening Complete Not Applicable Not Applicable  ?Skilled  Nursing Facility Complete Not Applicable Not Applicable  ?Some recent data might be hidden  ? ? ?

## 2021-09-13 ENCOUNTER — Encounter: Payer: Self-pay | Admitting: Oncology

## 2021-09-13 ENCOUNTER — Other Ambulatory Visit: Payer: Self-pay

## 2021-09-13 DIAGNOSIS — N179 Acute kidney failure, unspecified: Secondary | ICD-10-CM | POA: Diagnosis present

## 2021-09-13 DIAGNOSIS — N390 Urinary tract infection, site not specified: Secondary | ICD-10-CM | POA: Diagnosis present

## 2021-09-13 DIAGNOSIS — A419 Sepsis, unspecified organism: Secondary | ICD-10-CM | POA: Diagnosis not present

## 2021-09-13 DIAGNOSIS — D696 Thrombocytopenia, unspecified: Secondary | ICD-10-CM | POA: Diagnosis present

## 2021-09-13 DIAGNOSIS — D649 Anemia, unspecified: Secondary | ICD-10-CM | POA: Diagnosis present

## 2021-09-13 DIAGNOSIS — N342 Other urethritis: Secondary | ICD-10-CM

## 2021-09-13 HISTORY — DX: Urinary tract infection, site not specified: N39.0

## 2021-09-13 LAB — URINE CULTURE
Culture: 50000 — AB
Culture: NO GROWTH

## 2021-09-13 LAB — RENAL FUNCTION PANEL
Albumin: 2.3 g/dL — ABNORMAL LOW (ref 3.5–5.0)
Anion gap: 5 (ref 5–15)
BUN: 38 mg/dL — ABNORMAL HIGH (ref 8–23)
CO2: 25 mmol/L (ref 22–32)
Calcium: 8.2 mg/dL — ABNORMAL LOW (ref 8.9–10.3)
Chloride: 108 mmol/L (ref 98–111)
Creatinine, Ser: 0.84 mg/dL (ref 0.44–1.00)
GFR, Estimated: >60 mL/min (ref >60)
Glucose, Bld: 138 mg/dL — ABNORMAL HIGH (ref 70–99)
Phosphorus: 2.3 mg/dL — ABNORMAL LOW (ref 2.5–4.6)
Potassium: 3.5 mmol/L (ref 3.5–5.1)
Sodium: 138 mmol/L (ref 135–145)

## 2021-09-13 LAB — CBC
HCT: 23.3 % — ABNORMAL LOW (ref 36.0–46.0)
Hemoglobin: 6.9 g/dL — ABNORMAL LOW (ref 12.0–15.0)
MCH: 25.6 pg — ABNORMAL LOW (ref 26.0–34.0)
MCHC: 29.6 g/dL — ABNORMAL LOW (ref 30.0–36.0)
MCV: 86.3 fL (ref 80.0–100.0)
Platelets: 99 10*3/uL — ABNORMAL LOW (ref 150–400)
RBC: 2.7 MIL/uL — ABNORMAL LOW (ref 3.87–5.11)
RDW: 17.4 % — ABNORMAL HIGH (ref 11.5–15.5)
WBC: 15 10*3/uL — ABNORMAL HIGH (ref 4.0–10.5)
nRBC: 0 % (ref 0.0–0.2)

## 2021-09-13 LAB — HEMOGLOBIN AND HEMATOCRIT, BLOOD
HCT: 23.3 % — ABNORMAL LOW (ref 36.0–46.0)
Hemoglobin: 7.1 g/dL — ABNORMAL LOW (ref 12.0–15.0)

## 2021-09-13 LAB — GLUCOSE, CAPILLARY
Glucose-Capillary: 111 mg/dL — ABNORMAL HIGH (ref 70–99)
Glucose-Capillary: 149 mg/dL — ABNORMAL HIGH (ref 70–99)
Glucose-Capillary: 166 mg/dL — ABNORMAL HIGH (ref 70–99)
Glucose-Capillary: 158 mg/dL — ABNORMAL HIGH (ref 70–99)

## 2021-09-13 LAB — CULTURE, BLOOD (ROUTINE X 2)

## 2021-09-13 LAB — MAGNESIUM: Magnesium: 1.8 mg/dL (ref 1.7–2.4)

## 2021-09-13 LAB — PROCALCITONIN: Procalcitonin: 62.37 ng/mL

## 2021-09-13 MED ORDER — MAGNESIUM SULFATE 2 GM/50ML IV SOLN
2.0000 g | Freq: Once | INTRAVENOUS | Status: AC
  Administered 2021-09-13: 11:00:00 2 g via INTRAVENOUS
  Filled 2021-09-13: qty 50

## 2021-09-13 MED ORDER — POTASSIUM CHLORIDE 10 MEQ/100ML IV SOLN
10.0000 meq | INTRAVENOUS | Status: AC
  Administered 2021-09-13 (×2): 10 meq via INTRAVENOUS
  Filled 2021-09-13 (×2): qty 100

## 2021-09-13 NOTE — Assessment & Plan Note (Addendum)
Improving.  White blood cell count, lactic acid level and procalcitonin slowly trending downward.  Continue pressor support plus IV fluids and antibiotics.  Felt to be secondary to urinary tract infection.  Blood and urine cultures positive for Proteus and Enterobacter.  With procalcitonin strongly trending downward, discussed with pharmacy and will plan to stop Rocephin after 4 days and continue amoxicillin p.o. for total of 10 days of therapy.  Procalcitonin continues to trend downward. ?

## 2021-09-13 NOTE — Hospital Course (Addendum)
66 year old female with past medical history of DVT/PE on Xarelto, obesity, chronic lymphedema, hypertension, diabetes mellitus, diastolic heart failure, recent COVID infection last month admitted 3/7 with septic shock from either left-sided hydronephrosis versus pneumonia.  Patient started on fluids and antibiotics and pressor support.  Patient seen by urology and underwent a cystoscopy on 3/7 with left ureteral stent placement.  Following admission, urine cultures grew out Proteus and blood cultures grew out Proteus and Enterobacter.  Currently being diuresed.

## 2021-09-13 NOTE — Assessment & Plan Note (Addendum)
Suspect acute and chronic with multifactorial.  Hemoglobin with slight improvement from previous day.  Status post 1 unit packed red blood cells ?

## 2021-09-13 NOTE — Assessment & Plan Note (Addendum)
?    Secondary to sepsis.  Continue to follow closely.  Slowly improving

## 2021-09-13 NOTE — Progress Notes (Signed)
PHARMACY CONSULT NOTE ? ?Pharmacy Consult for Electrolyte Monitoring and Replacement  ? ?Recent Labs: ?Potassium (mmol/L)  ?Date Value  ?09/13/2021 3.5  ? ?Magnesium (mg/dL)  ?Date Value  ?09/13/2021 1.8  ? ?Calcium (mg/dL)  ?Date Value  ?09/13/2021 8.2 (L)  ? ?Albumin (g/dL)  ?Date Value  ?09/13/2021 2.3 (L)  ? ?Phosphorus (mg/dL)  ?Date Value  ?09/13/2021 2.3 (L)  ? ?Sodium (mmol/L)  ?Date Value  ?09/13/2021 138  ? ?Corrected Ca: 8.98 mg/dL ? ?Assessment:  66 y.o. 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 Pharmacy is asked to follow and replace electrolytes while in CCU. ? ?Goal of Therapy:  ?Electrolytes WNL ? ?Plan:  ?Given cardiac history will repeat one more set of repletion. CTM w/ AM labs. ?K: 3.3>3.5- Replete w/ KCL IV 74mq q1h x2 ?Mg: 1.5>1.8 - Replete w/ MgSO4 IV 2g x1 ?Recheck electrolytes in am 3/10 ? ?BLorna Dibble PharmD, BCCP ?Clinical Pharmacist ?09/13/2021 10:04 AM ? ? ?

## 2021-09-13 NOTE — Progress Notes (Signed)
[facility] Progress Note ? ?Patient: Courtney Mack    DPO:[id number]  DOA: 09/11/2021    ?Date of Service: the patient was seen and examined on 09/13/2021 ? ?Brief hospital course: ?66 year old female with past medical history of DVT/PE on Xarelto, obesity, chronic lymphedema, hypertension, diabetes mellitus, diastolic heart failure, recent COVID infection last month admitted 3/7 with septic shock from either left-sided hydronephrosis versus pneumonia.  Patient started on fluids and antibiotics and pressor support.  Patient seen by urology and underwent a cystoscopy on 3/7 with left ureteral stent placement. ? ?Assessment and Plan: ?Assessment and Plan: ?* Septic shock ([place]) ?Improving.  White blood cell count, lactic acid level and procalcitonin slowly trending downward.  Continue pressor support plus IV fluids and antibiotics.  Felt to be secondary to urinary tract infection.  Blood and urine cultures positive for Proteus and Enterobacter. ? ?Thrombocytopenia ([place]) ??  Secondary to sepsis.  Continue to follow closely ? ?Anemia ?Suspect acute and chronic with multifactorial.  Thrombocytopenia, monitor for blood loss. ? ?AKI (acute kidney injury) ([place]) ?Secondary to septic shock.  Improved with IV fluids. ? ?Morbid obesity ([place]) ?Meets criteria BMI greater than 40 ? ? ? ? ? ? ?Body mass index is 46.54 kg/m?Marland Kitchen  ?Nutrition Problem: Inadequate oral intake ?Etiology: acute illness ?Pressure Injury 06/20/21 Buttocks Posterior;Lower;Right Stage 2 -  Partial thickness loss of dermis presenting as a shallow open injury with a red, pink wound bed without slough. small pink/red shallow opening (Active)  ?06/20/21 2100  ?Location: Buttocks  ?Location Orientation: Posterior;Lower;Right  ?Staging: Stage 2 -  Partial thickness loss of dermis presenting as a shallow open injury with a red, pink wound bed without slough.  ?Wound Description (Comments): small pink/red shallow opening  ?Present on Admission: Yes  ?   ? ?Consultants: ?Critical care ?Urology ? ?Procedures: ?Cystoscopy status post left ureteral stent placement 3/8 ? ?Antimicrobials: ?IV Rocephin 3/7-present ? ?Code Status: Full code ? ? ?Subjective: Patient tired, but feeling overall little better.  Denies any pain currently. ? ?Objective: ?Noted borderline tachycardia ?Vitals:  ? 09/13/21 1100 09/13/21 1200  ?BP: 124/63 123/72  ?Pulse: 99 (!) 104  ?Resp: (!) 25 (!) 33  ?Temp:  98.6 ?F (37 ?C)  ?SpO2: (!) 89% (!) 89%  ? ? ?Intake/Output Summary (Last 24 hours) at 09/13/2021 1354 ?Last data filed at 09/13/2021 1300 ?Gross per 24 hour  ?Intake 2966.42 ml  ?Output 1575 ml  ?Net 1391.42 ml  ? ?Filed Weights  ? 09/11/21 1138 09/12/21 0335 09/13/21 0600  ?Weight: 95.1 kg 96.7 kg 101 kg  ? ?Body mass index is 46.54 kg/m?. ? ?Exam: ? ?General: Alert and oriented x2, no acute distress ?HEENT: Normocephalic, atraumatic, mucous membranes slightly dry ?Cardiovascular: Regular rhythm, borderline tachycardia ?Respiratory: Decreased breath sounds bibasilar ?Abdomen: Soft, nondistended, normoactive bowel sounds ?Musculoskeletal: No clubbing or cyanosis, trace pitting edema ?Skin: No skin breaks, tears or lesions ?Psychiatry: Appropriate, no evidence of psychoses ?Neurology: No focal deficits ? ?Data Reviewed: ?Noted improvement in white blood cell count, hemoglobin of 7.1 and stable as well as improvement in procalcitonin and lactic acidosis ? ?Disposition:  ?Status is: Inpatient ?Remains inpatient appropriate because: Further stabilization of infection ?  ? ?Family Communication: Left message for family ?DVT Prophylaxis: ?heparin injection 5,000 Units Start: 09/12/21 1400 ?Place and maintain sequential compression device Start: 09/11/21 1047 ?SCDs Start: 09/11/21 0646 ? ? ? ?Author: ?Annita Brod ,MD ?09/13/2021 1:54 PM ? ?To reach On-call, see care teams to locate the attending and reach out  via [email or web address]. ?Between 7PM-7AM, please contact night-coverage ?If you still have  difficulty reaching the attending provider, please page the [facility] (Director on Call) for [facility] on amion for assistance. ? ?

## 2021-09-13 NOTE — Progress Notes (Signed)
Occupational Therapy Treatment ?Patient Details ?Name: Courtney Mack ?MRN: [id number] ?DOB: October 21, 1955 ?Today's Date: 09/13/2021 ? ? ?History of present illness Kimani Bedoya is a 54yoF who comes to [facility] on 09/11/21 with abd pain and in code sepsis.  PMH: PE/DVT xarelto, recurent GIB, HF, DM. Pt is status post discharge from hospital less than 1 month ago with presyncope, chest pain and hypoglycemic episode and was found to have a GI bleed while on Xarelto status post GI evaluation.  During that admission in February 2023 she was also COVID-19 positive. ?  ?OT comments ? Ms Emile was seen for OT treatment on this date. Upon arrival to room pt reclined in bed, requesting bath with encouragement from NT pt agrees to OOB to chair. Pt requires MOD A bathing, assist for LB access in sitting and rear in standing. MOD A + RW step pivot t/f bed>chair, requires BUE support in standing. MAX A don B socks seated EOB. Pt making good progress toward goals. Pt continues to benefit from skilled OT services to maximize return to PLOF and minimize risk of future falls, injury, caregiver burden, and readmission. Will continue to follow POC. Discharge recommendation remains appropriate.  ?  ? ?Recommendations for follow up therapy are one component of a multi-disciplinary discharge planning process, led by the attending physician.  Recommendations may be updated based on patient status, additional functional criteria and insurance authorization. ?   ?Follow Up Recommendations ? Skilled nursing-short term rehab (<3 hours/day)  ?  ?Assistance Recommended at Discharge Frequent or constant Supervision/Assistance  ?Patient can return home with the following ? A lot of help with bathing/dressing/bathroom;Help with stairs or ramp for entrance;A little help with walking and/or transfers ?  ?Equipment Recommendations ? Other (comment) (defer)  ?  ?Recommendations for Other Services   ? ?  ?Precautions / Restrictions  Precautions ?Precautions: Fall ?Restrictions ?Weight Bearing Restrictions: No  ? ? ?  ? ?Mobility Bed Mobility ?Overal bed mobility: Needs Assistance ?Bed Mobility: Supine to Sit ?  ?  ?Supine to sit: Min assist, HOB elevated ?  ?  ?  ?  ? ?Transfers ?Overall transfer level: Needs assistance ?Equipment used: Rolling walker (2 wheels) ?Transfers: Sit to/from Stand ?Sit to Stand: Min assist ?  ?  ?  ?  ?  ?  ?  ?  ?Balance Overall balance assessment: Needs assistance ?Sitting-balance support: Single extremity supported, Feet supported ?Sitting balance-Leahy Scale: Fair ?  ?  ?Standing balance support: Bilateral upper extremity supported ?Standing balance-Leahy Scale: Fair ?  ?  ?  ?  ?  ?  ?  ?  ?  ?  ?  ?  ?   ? ?ADL either performed or assessed with clinical judgement  ? ?ADL Overall ADL's : Needs assistance/impaired ?  ?  ?  ?  ?  ?  ?  ?  ?  ?  ?  ?  ?  ?  ?  ?  ?  ?  ?  ?General ADL Comments: MOD A bathing, assist for LB access in sitting and rear in standing. MOD A + RW for ADL t/f, requires BUE support in standing. MAX A don B socks seated EOB ?  ? ? ? ?Cognition Arousal/Alertness: Awake/alert ?Behavior During Therapy: [facility] for tasks assessed/performed ?Overall Cognitive Status: Within Functional Limits for tasks assessed ?  ?  ?  ?  ?  ?  ?  ?  ?  ?  ?  ?  ?  ?  ?  ?  ?  ?  ?  ?   ?   ?   ?   ? ? ?  Pertinent Vitals/ Pain       Pain Assessment ?Pain Assessment: No/denies pain ? ? ?Frequency ? Min 2X/week  ? ? ? ? ?  ?Progress Toward Goals ? ?OT Goals(current goals can now be found in the care plan section) ? Progress towards OT goals: Progressing toward goals ? ?Acute Rehab OT Goals ?Patient Stated Goal: to feel better ?OT Goal Formulation: With patient ?Time For Goal Achievement: 09/25/21 ?Potential to Achieve Goals: Good ?ADL Goals ?Pt Will Perform Grooming: standing;with min guard assist ?Pt Will Transfer to Toilet: with min guard assist;ambulating;bedside commode ?Pt Will Perform Toileting - Clothing  Manipulation and hygiene: with modified independence;sitting/lateral leans  ?Plan Discharge plan remains appropriate;Frequency remains appropriate   ? ?Co-evaluation ? ? ?   ?  ?  ?  ?  ? ?  ?AM-PAC OT "6 Clicks" Daily Activity     ?Outcome Measure ? ? Help from another person eating meals?: None ?Help from another person taking care of personal grooming?: A Little ?Help from another person toileting, which includes using toliet, bedpan, or urinal?: A Lot ?Help from another person bathing (including washing, rinsing, drying)?: A Lot ?Help from another person to put on and taking off regular upper body clothing?: A Little ?Help from another person to put on and taking off regular lower body clothing?: A Lot ?6 Click Score: 16 ? ?  ?End of Session Equipment Utilized During Treatment: Rolling walker (2 wheels) ? ?OT Visit Diagnosis: Other abnormalities of gait and mobility (R26.89);Muscle weakness (generalized) (M62.81) ?  ?Activity Tolerance Patient tolerated treatment well ?  ?Patient Left in chair;with call bell/phone within reach;with nursing/sitter in room ?  ?Nurse Communication Mobility status ?  ? ?   ? ?Time: 3428-7681 ?OT Time Calculation (min): 24 min ? ?Charges: OT General Charges ?$OT Visit: 1 Visit ?OT Treatments ?$Self Care/Home Management : 8-22 mins ?$Therapeutic Exercise: 8-22 mins ? ?Dessie Coma, M.S. OTR/L  ?09/13/21, 2:00 PM  ?ascom [phone] ? ?

## 2021-09-13 NOTE — Assessment & Plan Note (Signed)
.    Meets criteria BMI greater than 40 

## 2021-09-13 NOTE — Assessment & Plan Note (Signed)
Secondary to septic shock.  Improved with IV fluids.

## 2021-09-14 DIAGNOSIS — I10 Essential (primary) hypertension: Secondary | ICD-10-CM

## 2021-09-14 LAB — RENAL FUNCTION PANEL
Albumin: 2.6 g/dL — ABNORMAL LOW (ref 3.5–5.0)
Anion gap: 7 (ref 5–15)
BUN: 38 mg/dL — ABNORMAL HIGH (ref 8–23)
CO2: 24 mmol/L (ref 22–32)
Calcium: 8.4 mg/dL — ABNORMAL LOW (ref 8.9–10.3)
Chloride: 109 mmol/L (ref 98–111)
Creatinine, Ser: 0.71 mg/dL (ref 0.44–1.00)
GFR, Estimated: >60 mL/min (ref >60)
Glucose, Bld: 128 mg/dL — ABNORMAL HIGH (ref 70–99)
Phosphorus: 2.2 mg/dL — ABNORMAL LOW (ref 2.5–4.6)
Potassium: 3.5 mmol/L (ref 3.5–5.1)
Sodium: 140 mmol/L (ref 135–145)

## 2021-09-14 LAB — GLUCOSE, CAPILLARY
Glucose-Capillary: 111 mg/dL — ABNORMAL HIGH (ref 70–99)
Glucose-Capillary: 132 mg/dL — ABNORMAL HIGH (ref 70–99)
Glucose-Capillary: 151 mg/dL — ABNORMAL HIGH (ref 70–99)
Glucose-Capillary: 124 mg/dL — ABNORMAL HIGH (ref 70–99)

## 2021-09-14 LAB — CULTURE, BLOOD (ROUTINE X 2)

## 2021-09-14 LAB — CBC
HCT: 24.8 % — ABNORMAL LOW (ref 36.0–46.0)
Hemoglobin: 7.6 g/dL — ABNORMAL LOW (ref 12.0–15.0)
MCH: 26.1 pg (ref 26.0–34.0)
MCHC: 30.6 g/dL (ref 30.0–36.0)
MCV: 85.2 fL (ref 80.0–100.0)
Platelets: 107 10*3/uL — ABNORMAL LOW (ref 150–400)
RBC: 2.91 MIL/uL — ABNORMAL LOW (ref 3.87–5.11)
RDW: 17.6 % — ABNORMAL HIGH (ref 11.5–15.5)
WBC: 9.8 10*3/uL (ref 4.0–10.5)
nRBC: 0 % (ref 0.0–0.2)

## 2021-09-14 LAB — PROCALCITONIN: Procalcitonin: 32.29 ng/mL

## 2021-09-14 LAB — MAGNESIUM: Magnesium: 2.1 mg/dL (ref 1.7–2.4)

## 2021-09-14 MED ORDER — AMOXICILLIN 500 MG PO CAPS
1000.0000 mg | ORAL_CAPSULE | Freq: Three times a day (TID) | ORAL | Status: DC
  Administered 2021-09-15 – 2021-09-18 (×11): 1000 mg via ORAL
  Filled 2021-09-14 (×13): qty 2

## 2021-09-14 MED ORDER — FUROSEMIDE 10 MG/ML IJ SOLN
20.0000 mg | Freq: Once | INTRAMUSCULAR | Status: AC
  Administered 2021-09-14: 20 mg via INTRAVENOUS
  Filled 2021-09-14: qty 2

## 2021-09-14 MED ORDER — POTASSIUM CHLORIDE 20 MEQ PO PACK
40.0000 meq | PACK | Freq: Once | ORAL | Status: AC
  Administered 2021-09-14: 40 meq via ORAL
  Filled 2021-09-14: qty 2

## 2021-09-14 MED ORDER — LEVALBUTEROL HCL 1.25 MG/0.5ML IN NEBU: 1.2500 mg | INHALATION_SOLUTION | Freq: Four times a day (QID) | RESPIRATORY_TRACT | Status: DC | PRN

## 2021-09-14 MED ORDER — IPRATROPIUM BROMIDE 0.02 % IN SOLN: 0.5000 mg | Freq: Four times a day (QID) | RESPIRATORY_TRACT | Status: DC | PRN

## 2021-09-14 MED ORDER — POTASSIUM & SODIUM PHOSPHATES 280-160-250 MG PO PACK
2.0000 | PACK | Freq: Every day | ORAL | Status: AC
  Administered 2021-09-14: 2 via ORAL
  Filled 2021-09-14: qty 2

## 2021-09-14 NOTE — Progress Notes (Signed)
Physical Therapy Treatment ?Patient Details ?Name: CANDIA KINGSBURY ?MRN: [id number] ?DOB: [birth year] ?Today's Date: 09/14/2021 ? ? ?History of Present Illness Thania Woodlief is a 59yoF who comes to [facility] on 09/11/21 with abd pain and in code sepsis.  PMH: PE/DVT xarelto, recurent GIB, HF, DM. Pt is status post discharge from hospital less than 1 month ago with presyncope, chest pain and hypoglycemic episode and was found to have a GI bleed while on Xarelto status post GI evaluation.  During that admission in February 2023 she was also COVID-19 positive. ? ?  ?PT Comments  ? ? Pt is chair on arrival, lunch eaten, now gaming on a sweet tablet. Pt agreeable to session, reports to feel much improved since prior PT session 2 days prior. Pt able to rise to standing several times in session, slow, labored, but well established patterns in an UE dominant strategy. Pt able to commence some AMB with RW, to door twice, not fully exhausted, could easily progress to hallway AMB next session. Assisted back to bed at end of session at pt request.  ? ?   ?Recommendations for follow up therapy are one component of a multi-disciplinary discharge planning process, led by the attending physician.  Recommendations may be updated based on patient status, additional functional criteria and insurance authorization. ? ?Follow Up Recommendations ? Skilled nursing-short term rehab (<3 hours/day) ?  ?  ?Assistance Recommended at Discharge Intermittent Supervision/Assistance  ?Patient can return home with the following A lot of help with walking and/or transfers;A lot of help with bathing/dressing/bathroom;A little help with bathing/dressing/bathroom;Two people to help with walking and/or transfers ?  ?Equipment Recommendations ?    ?  ?Recommendations for Other Services   ? ? ?  ?Precautions / Restrictions Precautions ?Precautions: Fall  ?  ? ?Mobility ? Bed Mobility ?Overal bed mobility: Needs Assistance ?Bed Mobility: Sit to Supine ?  ?  ?   ?Sit to supine: Max assist ?  ?General bed mobility comments: maxA of legs ?  ? ?Transfers ?Overall transfer level: Needs assistance ?Equipment used: Rolling walker (2 wheels) ?Transfers: Sit to/from Stand ?Sit to Stand: Supervision ?  ?  ?  ?  ?  ?General transfer comment: demos good baseline adapatations with heavy tricep extension off chair with vertical trunk and safe transition of hands from chair to RW ?  ? ?Ambulation/Gait ?Ambulation/Gait assistance: Supervision ?Gait Distance (Feet): 35 Feet ?Assistive device: Rolling walker (2 wheels) ?Gait Pattern/deviations: Step-to pattern ?  ?  ?  ?General Gait Details: does well in general, given a chair follow this being her first time AMB in several days. ? ? ?Stairs ?  ?  ?  ?  ?  ? ? ?Wheelchair Mobility ?  ? ?Modified Rankin (Stroke Patients Only) ?  ? ? ?  ?Balance   ?  ?  ?  ?  ?  ?  ?  ?  ?  ?  ?  ?  ?  ?  ?  ?  ?  ?  ?  ? ?  ?Cognition   ?  ?  ?  ?  ?  ?  ?  ?  ?  ?  ?  ?  ?  ?  ?  ?  ?  ?  ?  ?  ?  ? ?  ?Exercises General Exercises - Lower Extremity ?Long Arc Quad: AROM, Both, 15 reps, Seated ? ?  ?General Comments   ?  ?  ? ?Pertinent Vitals/Pain Pain  Assessment ?Pain Assessment: No/denies pain  ? ? ?Home Living   ?  ?  ?  ?  ?  ?  ?  ?  ?  ?   ?  ?Prior Function    ?  ?  ?   ? ?PT Goals (current goals can now be found in the care plan section) Acute Rehab PT Goals ?Patient Stated Goal: regain strength ?PT Goal Formulation: With patient ?Time For Goal Achievement: 09/26/21 ?Potential to Achieve Goals: Fair ?Progress towards PT goals: Progressing toward goals ? ?  ?Frequency ? ? ? Min 2X/week ? ? ? ?  ?PT Plan Current plan remains appropriate  ? ? ?Co-evaluation   ?  ?  ?  ?  ? ?  ?AM-PAC PT "6 Clicks" Mobility   ?Outcome Measure ? Help needed turning from your back to your side while in a flat bed without using bedrails?: Total ?Help needed moving from lying on your back to sitting on the side of a flat bed without using bedrails?: Total ?Help needed  moving to and from a bed to a chair (including a wheelchair)?: A Little ?Help needed standing up from a chair using your arms (e.g., wheelchair or bedside chair)?: A Little ?Help needed to walk in hospital room?: A Little ?Help needed climbing 3-5 steps with a railing? : A Lot ?6 Click Score: 13 ? ?  ?End of Session   ?Activity Tolerance: Patient tolerated treatment well;No increased pain ?Patient left: in bed;with call bell/phone within reach ?Nurse Communication: Mobility status ?PT Visit Diagnosis: Unsteadiness on feet (R26.81);Difficulty in walking, not elsewhere classified (R26.2);Other abnormalities of gait and mobility (R26.89) ?  ? ? ?Time: 6712-4580 ?PT Time Calculation (min) (ACUTE ONLY): 28 min ? ?Charges:  $Therapeutic Activity: 23-37 mins          ?          ?4:13 PM, 09/14/21 ?Etta Grandchild, PT, DPT ?Physical Therapist - [place] ?[facility]  ?[phone] (ASCOM)  ? ? ? ?Daiquan Resnik C ?09/14/2021, 4:11 PM ? ?

## 2021-09-14 NOTE — Care Management Important Message (Signed)
Important Message ? ?Patient Details  ?Name: Courtney Mack ?MRN: [id number] ?Date of Birth: [birth year] ? ? ?Medicare Important Message Given:  Yes ? ? ? ? ?[name] A Tishia Maestre ?09/14/2021, 12:41 PM ?

## 2021-09-14 NOTE — Assessment & Plan Note (Signed)
Blood pressure trending upward likely secondary to IV fluids.  Note that BNP was elevated on admission and echocardiogram was checked noting normal ejection fraction and normal diastolic function.  We will give 1 dose of Lasix for any volume overload.  Patient does not have CHF.

## 2021-09-14 NOTE — Progress Notes (Signed)
[facility] Progress Note ? ?Patient: Courtney Mack    FXT:[id number]  DOA: 09/11/2021    ?Date of Service: the patient was seen and examined on 09/14/2021 ? ?Brief hospital course: ?66 year old female with past medical history of DVT/PE on Xarelto, obesity, chronic lymphedema, hypertension, diabetes mellitus, diastolic heart failure, recent COVID infection last month admitted 3/7 with septic shock from either left-sided hydronephrosis versus pneumonia.  Patient started on fluids and antibiotics and pressor support.  Patient seen by urology and underwent a cystoscopy on 3/7 with left ureteral stent placement.  Following admission, urine cultures grew out Proteus and blood cultures grew out Proteus and Enterobacter. ? ?Assessment and Plan: ?Assessment and Plan: ?* Septic shock ([place]) ?Improving.  White blood cell count, lactic acid level and procalcitonin slowly trending downward.  Continue pressor support plus IV fluids and antibiotics.  Felt to be secondary to urinary tract infection.  Blood and urine cultures positive for Proteus and Enterobacter.  With procalcitonin strongly trending downward, discussed with pharmacy and will plan to stop Rocephin after 4 days and continue amoxicillin p.o. for total of 10 days of therapy ? ?Urinary tract infection ?Secondary to Proteus.  As above. ? ?Thrombocytopenia ([place]) ??  Secondary to sepsis.  Continue to follow closely.  Slowly improving ? ?Anemia ?Suspect acute and chronic with multifactorial.  Hemoglobin with slight improvement from previous day.  Status post 1 unit packed red blood cells ? ?AKI (acute kidney injury) ([place]) ?Secondary to septic shock.  Improved with IV fluids. ? ?Essential hypertension ?Blood pressure trending upward likely secondary to IV fluids.  Note that BNP was elevated on admission and echocardiogram was checked noting normal ejection fraction and normal diastolic function.  We will give 1 dose of Lasix for any volume overload.  Patient does  not have CHF. ? ?Morbid obesity ([place]) ?Meets criteria BMI greater than 40 ? ? ? ? ? ? ?Body mass index is 44 kg/m?Marland Kitchen  ?Nutrition Problem: Inadequate oral intake ?Etiology: acute illness ?Pressure Injury 06/20/21 Buttocks Posterior;Lower;Right Stage 2 -  Partial thickness loss of dermis presenting as a shallow open injury with a red, pink wound bed without slough. small pink/red shallow opening (Active)  ?06/20/21 2100  ?Location: Buttocks  ?Location Orientation: Posterior;Lower;Right  ?Staging: Stage 2 -  Partial thickness loss of dermis presenting as a shallow open injury with a red, pink wound bed without slough.  ?Wound Description (Comments): small pink/red shallow opening  ?Present on Admission: Yes  ?  ? ?Consultants: ?Critical care ?Urology ? ?Procedures: ?Cystoscopy status post left ureteral stent placement 3/8 ? ?Antimicrobials: ?IV Rocephin 3/7-3/10 ?P.o. amoxicillin 3/11-present ? ?Code Status: Full code ? ? ?Subjective: Patient still very tired, weak ? ?Objective: ?Blood pressure slightly increasing ?Vitals:  ? 09/14/21 1633 09/14/21 1657  ?BP: 134/68 (!) 149/78  ?Pulse: 73 90  ?Resp: 16 (!) 22  ?Temp: 97.7 ?F (36.5 ?C) 97.9 ?F (36.6 ?C)  ?SpO2: 95% 96%  ? ? ?Intake/Output Summary (Last 24 hours) at 09/14/2021 1718 ?Last data filed at 09/14/2021 1146 ?Gross per 24 hour  ?Intake --  ?Output 2650 ml  ?Net -2650 ml  ? ? ?Filed Weights  ? 09/12/21 0335 09/13/21 0600 09/14/21 0451  ?Weight: 96.7 kg 101 kg 95.5 kg  ? ?Body mass index is 44 kg/m?. ? ?Exam: ? ?General: Alert and oriented x2, no acute distress ?HEENT: Normocephalic, atraumatic, mucous membranes slightly dry ?Cardiovascular: Regular rate and rhythm, S1-S2 ?Respiratory: Decreased breath sounds bibasilar ?Abdomen: Soft, nondistended, normoactive bowel sounds ?Musculoskeletal: No  clubbing or cyanosis, trace pitting edema ?Skin: No skin breaks, tears or lesions ?Psychiatry: Appropriate, no evidence of psychoses ?Neurology: No focal deficits ? ?Data  Reviewed: ?Noted mild improvement in hemoglobin.  Decreasing procalcitonin ? ?Disposition:  ?Status is: Inpatient ?Remains inpatient appropriate because: Further stabilization of infection ?  ? ?Family Communication: Left message for family ?DVT Prophylaxis: ?heparin injection 5,000 Units Start: 09/12/21 1400 ?Place and maintain sequential compression device Start: 09/11/21 1047 ?SCDs Start: 09/11/21 0646 ? ? ? ?Author: ?Annita Brod ,MD ?09/14/2021 5:18 PM ? ?To reach On-call, see care teams to locate the attending and reach out via [email or web address]. ?Between 7PM-7AM, please contact night-coverage ?If you still have difficulty reaching the attending provider, please page the [place] (Director on Call) for [facility] on amion for assistance. ? ?

## 2021-09-14 NOTE — Progress Notes (Signed)
PHARMACY CONSULT NOTE ? ?Pharmacy Consult for Electrolyte Monitoring and Replacement  ? ?Recent Labs: ?Potassium (mmol/L)  ?Date Value  ?09/14/2021 3.5  ? ?Magnesium (mg/dL)  ?Date Value  ?09/14/2021 2.1  ? ?Calcium (mg/dL)  ?Date Value  ?09/14/2021 8.4 (L)  ? ?Albumin (g/dL)  ?Date Value  ?09/14/2021 2.6 (L)  ? ?Phosphorus (mg/dL)  ?Date Value  ?09/14/2021 2.2 (L)  ? ?Sodium (mmol/L)  ?Date Value  ?09/14/2021 140  ? ? ?Assessment:  66 y.o. 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 Pharmacy is asked to follow and replace electrolytes while in CCU. ? ?Goal of Therapy:  ?Electrolytes within normal limits ? ?Plan:  ?--Mild hypophosphatemia, will give Phos-Nak 2 packets x 1 dose (contains 14.2 mEq K+) ?--Hypokalemia resolved, potassium at LLN. Will give Kcl 40 mEq x 1 dose ?--Patient care has been transferred from PCCM to [facility]. Will discontinue electrolyte consult at this point. Defer further ordering of labs and electrolyte replacement to primary team ? ?[name]  ?09/14/2021 7:30 AM ? ? ?

## 2021-09-14 NOTE — TOC Progression Note (Signed)
Transition of Care (TOC) - Progression Note  ? ? ?Patient Details  ?Name: Courtney Mack ?MRN: [id number] ?Date of Birth: July 12, 1955 ? ?Transition of Care (TOC) CM/SW Contact  ?Jenasia Dolinar E [place]serrath Madding, LCSW ?Phone Number: ?09/14/2021, 1:51 PM ? ?Clinical Narrative:   Barbette Or with [facility] who stated they cannot take patient's insurance. ?Notified patient who stated she will not go to a different SNF. Would be agreeable to Home Health, no agency preference. Referral made to [facility] with [place]. ?Patient states she has all needed DME including a RW, lift chair, BSC, and wheelchair. ?Patient states her daughters and grandchildren live with her and are supportive.  ? ? ?Expected Discharge Plan: [place] ?Barriers to Discharge: Continued Medical Work up ? ?Expected Discharge Plan and Services ?Expected Discharge Plan: [place] ?  ?Discharge Planning Services: CM Consult ?Post Acute Care Choice: [place] ?Living arrangements for the past 2 months: [place] ?                ?DME Arranged: N/A ?DME Agency: NA ?  ?  ?  ?HH Arranged: PT, OT, RN ?[place] Agency: [place] ([place]) ?Date HH Agency Contacted: 09/11/21 ?Time [place]: 1914 ?Representative spoke with at [place]: Floydene Flock ? ? ?Social Determinants of Health (SDOH) Interventions ?  ? ?Readmission Risk Interventions ?Readmission Risk Prevention Plan 09/12/2021 09/11/2021 08/16/2021  ?Transportation Screening Complete Complete Complete  ?PCP or Specialist Appt within 3-5 Days - - -  ?[place] or [place] - - -  ?Social Work Consult for [place] Planning/Counseling - - -  ?Palliative Care Screening - - -  ?Medication Review Press photographer) - Complete Complete  ?PCP or Specialist appointment within 3-5 days of discharge - Complete Complete  ?[place] or Home Care Consult - Complete Complete  ?SW Recovery Care/Counseling Consult - Complete Complete  ?Palliative Care Screening  Complete Not Applicable Not Applicable  ?Skilled Nursing Facility Complete Not Applicable Not Applicable  ?Some recent data might be hidden  ? ? ?

## 2021-09-14 NOTE — Assessment & Plan Note (Signed)
Secondary to Proteus.  As above. ?

## 2021-09-15 LAB — CBC
HCT: 26.4 % — ABNORMAL LOW (ref 36.0–46.0)
Hemoglobin: 7.9 g/dL — ABNORMAL LOW (ref 12.0–15.0)
MCH: 25.3 pg — ABNORMAL LOW (ref 26.0–34.0)
MCHC: 29.9 g/dL — ABNORMAL LOW (ref 30.0–36.0)
MCV: 84.6 fL (ref 80.0–100.0)
Platelets: 125 10*3/uL — ABNORMAL LOW (ref 150–400)
RBC: 3.12 MIL/uL — ABNORMAL LOW (ref 3.87–5.11)
RDW: 17.5 % — ABNORMAL HIGH (ref 11.5–15.5)
WBC: 6.8 10*3/uL (ref 4.0–10.5)
nRBC: 0.4 % — ABNORMAL HIGH (ref 0.0–0.2)

## 2021-09-15 LAB — BASIC METABOLIC PANEL
Anion gap: 6 (ref 5–15)
BUN: 36 mg/dL — ABNORMAL HIGH (ref 8–23)
CO2: 25 mmol/L (ref 22–32)
Calcium: 8.3 mg/dL — ABNORMAL LOW (ref 8.9–10.3)
Chloride: 109 mmol/L (ref 98–111)
Creatinine, Ser: 0.58 mg/dL (ref 0.44–1.00)
GFR, Estimated: >60 mL/min (ref >60)
Glucose, Bld: 91 mg/dL (ref 70–99)
Potassium: 3.3 mmol/L — ABNORMAL LOW (ref 3.5–5.1)
Sodium: 140 mmol/L (ref 135–145)

## 2021-09-15 LAB — GLUCOSE, CAPILLARY
Glucose-Capillary: 88 mg/dL (ref 70–99)
Glucose-Capillary: 137 mg/dL — ABNORMAL HIGH (ref 70–99)
Glucose-Capillary: 109 mg/dL — ABNORMAL HIGH (ref 70–99)
Glucose-Capillary: 112 mg/dL — ABNORMAL HIGH (ref 70–99)

## 2021-09-15 NOTE — Progress Notes (Signed)
[facility] Progress Note ? ?Patient: Courtney Mack    [id number]:[id number]  DOA: 09/11/2021    ?Date of Service: the patient was seen and examined on 09/15/2021 ? ?Brief hospital course: ?66 year old female with past medical history of DVT/PE on Xarelto, obesity, chronic lymphedema, hypertension, diabetes mellitus, diastolic heart failure, recent COVID infection last month admitted 3/7 with septic shock from either left-sided hydronephrosis versus pneumonia.  Patient started on fluids and antibiotics and pressor support.  Patient seen by urology and underwent a cystoscopy on 3/7 with left ureteral stent placement.  Following admission, urine cultures grew out Proteus and blood cultures grew out Proteus and Enterobacter. ? ?Assessment and Plan: ?Assessment and Plan: ?* Septic shock ([place]) ?Improving.  White blood cell count, lactic acid level and procalcitonin slowly trending downward.  Continue pressor support plus IV fluids and antibiotics.  Felt to be secondary to urinary tract infection.  Blood and urine cultures positive for Proteus and Enterobacter.  With procalcitonin strongly trending downward, discussed with pharmacy and will plan to stop Rocephin after 4 days and continue amoxicillin p.o. for total of 10 days of therapy ? ?Urinary tract infection ?Secondary to Proteus.  As above. ? ?Thrombocytopenia ([place]) ??  Secondary to sepsis.  Continue to follow closely.  Slowly improving ? ?Anemia ?Suspect acute and chronic with multifactorial.  Hemoglobin with slight improvement from previous day.  Status post 1 unit packed red blood cells ? ?AKI (acute kidney injury) ([place]) ?Secondary to septic shock.  Improved with IV fluids. ? ?Essential hypertension ?Blood pressure trending upward likely secondary to IV fluids.  Note that BNP was elevated on admission and echocardiogram was checked noting normal ejection fraction and normal diastolic function.  We will give 1 dose of Lasix for any volume overload.  Patient does  not have CHF. ? ?Morbid obesity ([place]) ?Meets criteria BMI greater than 40 ? ? ? ? ? ? ?Body mass index is 46.17 kg/m?Marland Kitchen  ?Nutrition Problem: Inadequate oral intake ?Etiology: acute illness ?Pressure Injury 06/20/21 Buttocks Posterior;Lower;Right Stage 2 -  Partial thickness loss of dermis presenting as a shallow open injury with a red, pink wound bed without slough. small pink/red shallow opening (Active)  ?06/20/21 2100  ?Location: Buttocks  ?Location Orientation: Posterior;Lower;Right  ?Staging: Stage 2 -  Partial thickness loss of dermis presenting as a shallow open injury with a red, pink wound bed without slough.  ?Wound Description (Comments): small pink/red shallow opening  ?Present on Admission: Yes  ?  ? ?Consultants: ?Critical care ?Urology ? ?Procedures: ?Cystoscopy status post left ureteral stent placement 3/8 ? ?Antimicrobials: ?IV Rocephin 3/7-3/10 ?P.o. amoxicillin 3/11-present ? ?Code Status: Full code ? ? ?Subjective: Patient complains of being tired. ? ?Objective: ?Blood pressure slightly increasing ?Vitals:  ? 09/15/21 0614 09/15/21 0736  ?BP: (!) 148/64 140/61  ?Pulse: 96 96  ?Resp: 20 18  ?Temp: 99.2 ?F (37.3 ?C) 98.6 ?F (37 ?C)  ?SpO2: 94% 91%  ? ? ?Intake/Output Summary (Last 24 hours) at 09/15/2021 1550 ?Last data filed at 09/15/2021 1300 ?Gross per 24 hour  ?Intake 240 ml  ?Output 4800 ml  ?Net -4560 ml  ? ? ?Filed Weights  ? 09/13/21 0600 09/14/21 0451 09/15/21 0500  ?Weight: 101 kg 95.5 kg 100.2 kg  ? ?Body mass index is 46.17 kg/m?. ? ?Exam: ? ?General: Alert and oriented x2, no acute distress ?HEENT: Normocephalic, atraumatic, mucous membranes slightly dry ?Cardiovascular: Regular rate and rhythm, S1-S2 ?Respiratory: Decreased breath sounds bibasilar ?Abdomen: Soft, nondistended, normoactive bowel sounds ?Musculoskeletal: No  clubbing or cyanosis, trace pitting edema ?Skin: No skin breaks, tears or lesions ?Psychiatry: Appropriate, no evidence of psychoses ?Neurology: No focal  deficits ? ?Data Reviewed: ?Labs stable. ? ?Disposition:  ?Status is: Inpatient ?Remains inpatient appropriate because: Diuresis, stabilization of infection.  Anticipate discharge 3/12. ?  ? ?Family Communication: Left message for family ?DVT Prophylaxis: ?heparin injection 5,000 Units Start: 09/12/21 1400 ?Place and maintain sequential compression device Start: 09/11/21 1047 ?SCDs Start: 09/11/21 0646 ? ? ? ?Author: ?Annita Brod ,MD ?09/15/2021 3:50 PM ? ?To reach On-call, see care teams to locate the attending and reach out via [email or web address]. ?Between 7PM-7AM, please contact night-coverage ?If you still have difficulty reaching the attending provider, please page the [facility] (Director on Call) for [facility] on amion for assistance. ? ?

## 2021-09-16 DIAGNOSIS — E8770 Fluid overload, unspecified: Secondary | ICD-10-CM | POA: Diagnosis not present

## 2021-09-16 DIAGNOSIS — I5033 Acute on chronic diastolic (congestive) heart failure: Secondary | ICD-10-CM | POA: Clinically undetermined

## 2021-09-16 LAB — BASIC METABOLIC PANEL WITH GFR
Anion gap: 5 (ref 5–15)
BUN: 29 mg/dL — ABNORMAL HIGH (ref 8–23)
CO2: 26 mmol/L (ref 22–32)
Calcium: 8.1 mg/dL — ABNORMAL LOW (ref 8.9–10.3)
Chloride: 108 mmol/L (ref 98–111)
Creatinine, Ser: 0.79 mg/dL (ref 0.44–1.00)
GFR, Estimated: >60 mL/min
Glucose, Bld: 116 mg/dL — ABNORMAL HIGH (ref 70–99)
Potassium: 3.3 mmol/L — ABNORMAL LOW (ref 3.5–5.1)
Sodium: 139 mmol/L (ref 135–145)

## 2021-09-16 LAB — GLUCOSE, CAPILLARY
Glucose-Capillary: 96 mg/dL (ref 70–99)
Glucose-Capillary: 166 mg/dL — ABNORMAL HIGH (ref 70–99)
Glucose-Capillary: 136 mg/dL — ABNORMAL HIGH (ref 70–99)
Glucose-Capillary: 127 mg/dL — ABNORMAL HIGH (ref 70–99)

## 2021-09-16 LAB — PROCALCITONIN: Procalcitonin: 7.81 ng/mL

## 2021-09-16 LAB — BRAIN NATRIURETIC PEPTIDE: B Natriuretic Peptide: 566.5 pg/mL — ABNORMAL HIGH (ref 0.0–100.0)

## 2021-09-16 MED ORDER — FUROSEMIDE 10 MG/ML IJ SOLN
20.0000 mg | Freq: Two times a day (BID) | INTRAMUSCULAR | Status: DC
  Administered 2021-09-16 – 2021-09-18 (×5): 20 mg via INTRAVENOUS
  Filled 2021-09-16 (×6): qty 2

## 2021-09-16 MED ORDER — POTASSIUM CHLORIDE CRYS ER 20 MEQ PO TBCR
40.0000 meq | EXTENDED_RELEASE_TABLET | Freq: Every day | ORAL | Status: DC
Start: 2021-09-16 — End: 2021-09-18
  Administered 2021-09-16 – 2021-09-18 (×3): 40 meq via ORAL
  Filled 2021-09-16 (×3): qty 2

## 2021-09-16 NOTE — Progress Notes (Signed)
[facility] Progress Note ? ?Patient: Courtney Mack    PFX:[id number]  DOA: 09/11/2021    ?Date of Service: the patient was seen and examined on 09/16/2021 ? ?Brief hospital course: ?66 year old female with past medical history of DVT/PE on Xarelto, obesity, chronic lymphedema, hypertension, diabetes mellitus, diastolic heart failure, recent COVID infection last month admitted 3/7 with septic shock from either left-sided hydronephrosis versus pneumonia.  Patient started on fluids and antibiotics and pressor support.  Patient seen by urology and underwent a cystoscopy on 3/7 with left ureteral stent placement.  Following admission, urine cultures grew out Proteus and blood cultures grew out Proteus and Enterobacter.  Currently being diuresed. ? ?Assessment and Plan: ?Assessment and Plan: ?* Septic shock ([place]) ?Improving.  White blood cell count, lactic acid level and procalcitonin slowly trending downward.  Continue pressor support plus IV fluids and antibiotics.  Felt to be secondary to urinary tract infection.  Blood and urine cultures positive for Proteus and Enterobacter.  With procalcitonin strongly trending downward, discussed with pharmacy and will plan to stop Rocephin after 4 days and continue amoxicillin p.o. for total of 10 days of therapy.  Procalcitonin down down to 7 from 32 2 days ago. ? ?Volume overload ?As stated below, patient not with heart failure.  Echocardiogram unrevealing.  However, lungs do sound rhonchorous.  And with Lasix, she has diuresed over 14 L and is -4.3 L deficient. ? ?Urinary tract infection ?Secondary to Proteus.  As above. ? ?Thrombocytopenia ([place]) ??  Secondary to sepsis.  Continue to follow closely.  Slowly improving ? ?Anemia ?Suspect acute and chronic with multifactorial.  Hemoglobin with slight improvement from previous day.  Status post 1 unit packed red blood cells ? ?AKI (acute kidney injury) ([place]) ?Secondary to septic shock.  Improved with IV  fluids. ? ?Essential hypertension ?Blood pressure trending upward likely secondary to IV fluids.  Note that BNP was elevated on admission and echocardiogram was checked noting normal ejection fraction and normal diastolic function.  We will give 1 dose of Lasix for any volume overload.  Patient does not have CHF. ? ?Morbid obesity ([place]) ?Meets criteria BMI greater than 40 ? ? ? ? ? ? ?Body mass index is 46.21 kg/m?Marland Kitchen  ?Nutrition Problem: Inadequate oral intake ?Etiology: acute illness ?Pressure Injury 06/20/21 Buttocks Posterior;Lower;Right Stage 2 -  Partial thickness loss of dermis presenting as a shallow open injury with a red, pink wound bed without slough. small pink/red shallow opening (Active)  ?06/20/21 2100  ?Location: Buttocks  ?Location Orientation: Posterior;Lower;Right  ?Staging: Stage 2 -  Partial thickness loss of dermis presenting as a shallow open injury with a red, pink wound bed without slough.  ?Wound Description (Comments): small pink/red shallow opening  ?Present on Admission: Yes  ?  ? ?Consultants: ?Critical care ?Urology ? ?Procedures: ?Cystoscopy status post left ureteral stent placement 3/8 ? ?Antimicrobials: ?IV Rocephin 3/7-3/10 ?P.o. amoxicillin 3/11-present ? ?Code Status: Full code ? ? ?Subjective: Patient complains some back pain from kidney stone.  Also, having cough. ? ?Objective: ?Blood pressure slightly increasing ?Vitals:  ? 09/16/21 0735 09/16/21 1146  ?BP: (!) 132/59 124/62  ?Pulse: (!) 101 (!) 102  ?Resp: 18 20  ?Temp: 99.8 ?F (37.7 ?C) 98.5 ?F (36.9 ?C)  ?SpO2: 90% 92%  ? ? ?Intake/Output Summary (Last 24 hours) at 09/16/2021 1614 ?Last data filed at 09/16/2021 1300 ?Gross per 24 hour  ?Intake 480 ml  ?Output 3150 ml  ?Net -2670 ml  ? ?Filed Weights  ? 09/14/21  0451 09/15/21 0500 09/16/21 0500  ?Weight: 95.5 kg 100.2 kg 100.3 kg  ? ?Body mass index is 46.21 kg/m?. ? ?Exam: ? ?General: Alert and oriented x2, no acute distress ?HEENT: Normocephalic, atraumatic, mucous membranes  slightly dry ?Cardiovascular: Regular rate and rhythm, S1-S2 ?Respiratory: Scattered rhonchi ?Abdomen: Soft, nondistended, normoactive bowel sounds ?Musculoskeletal: No clubbing or cyanosis, trace pitting edema ?Skin: No skin breaks, tears or lesions ?Psychiatry: Appropriate, no evidence of psychoses ?Neurology: No focal deficits ? ?Data Reviewed: ?Labs stable. ? ?Disposition:  ?Status is: Inpatient ?Remains inpatient appropriate because: Diuresis, stabilization of infection.  Anticipate discharge 3/12. ?  ? ?Family Communication: Left message for family ?DVT Prophylaxis: ?heparin injection 5,000 Units Start: 09/12/21 1400 ?Place and maintain sequential compression device Start: 09/11/21 1047 ?SCDs Start: 09/11/21 0646 ? ? ? ?Author: ?Annita Brod ,MD ?09/16/2021 4:14 PM ? ?To reach On-call, see care teams to locate the attending and reach out via [email or web address]. ?Between 7PM-7AM, please contact night-coverage ?If you still have difficulty reaching the attending provider, please page the [facility] (Director on Call) for [facility] on amion for assistance. ? ?

## 2021-09-16 NOTE — Assessment & Plan Note (Addendum)
As stated below, patient not with heart failure.  Echocardiogram unrevealing.  However, lungs do sound rhonchorous.  And with Lasix, she has diuresed almost 19 L and is more than -12.9 L deficient

## 2021-09-17 DIAGNOSIS — E8770 Fluid overload, unspecified: Secondary | ICD-10-CM

## 2021-09-17 LAB — BASIC METABOLIC PANEL
Anion gap: 4 — ABNORMAL LOW (ref 5–15)
BUN: 25 mg/dL — ABNORMAL HIGH (ref 8–23)
CO2: 29 mmol/L (ref 22–32)
Calcium: 8 mg/dL — ABNORMAL LOW (ref 8.9–10.3)
Chloride: 104 mmol/L (ref 98–111)
Creatinine, Ser: 0.6 mg/dL (ref 0.44–1.00)
GFR, Estimated: >60 mL/min (ref >60)
Glucose, Bld: 106 mg/dL — ABNORMAL HIGH (ref 70–99)
Potassium: 3.5 mmol/L (ref 3.5–5.1)
Sodium: 137 mmol/L (ref 135–145)

## 2021-09-17 LAB — GLUCOSE, CAPILLARY
Glucose-Capillary: 96 mg/dL (ref 70–99)
Glucose-Capillary: 124 mg/dL — ABNORMAL HIGH (ref 70–99)
Glucose-Capillary: 114 mg/dL — ABNORMAL HIGH (ref 70–99)
Glucose-Capillary: 126 mg/dL — ABNORMAL HIGH (ref 70–99)

## 2021-09-17 LAB — PROCALCITONIN: Procalcitonin: 4.76 ng/mL

## 2021-09-17 MED ORDER — RIVAROXABAN 20 MG PO TABS
20.0000 mg | ORAL_TABLET | Freq: Every day | ORAL | Status: DC
  Administered 2021-09-17: 20 mg via ORAL
  Filled 2021-09-17 (×2): qty 1

## 2021-09-17 NOTE — Progress Notes (Signed)
Palliative: ?Courtney Mack, is lying quietly in bed.  She greets me, making and mostly keeping eye contact.  She appears acutely/chronically ill, morbidly obese.  She is alert and oriented, able to make her basic needs known.  Physical therapy is at bedside working for strength training. ? ?We talk about her acute health concerns including, but not limited to fluid overload and kidney function.  We talked about time for more diuresis.  Courtney Mack does feel that she is getting off more fluid.  Courtney Mack shares that her neck where she had IV site is itchy and painful.  Discussed with nurse to remove occlusive dressing if enough time has been passed so that the site may get air.   ? ?Courtney Mack states that her preference is to go home with home health.  Her daughter, Courtney Mack, lives in the home so she would have 24/7 care.  I encouraged her to work closely with PT and do exercises on her own when she can. ? ?Conference with attending, bedside nursing staff, transition of care team related to patient condition, needs, goals of care, disposition. ? ?Plan:    At this point continue full scope/full code.  Time for outcomes.  Anticipate rehospitalization's as she has had 4 hospital stays in the last 6 months.  Agreeable to outpatient palliative services/[facility]. ? ?50 minutes  ?Courtney Axe, NP ?Palliative medicine team ?Team phone [phone] ?Greater than 50% of this time was spent counseling and coordinating care related to the above assessment and plan. ?

## 2021-09-17 NOTE — Progress Notes (Addendum)
[facility] Progress Note ? ?Patient: Courtney Mack    MWN:[id number]  DOA: 09/11/2021    ?Date of Service: the patient was seen and examined on 09/17/2021 ? ?Brief hospital course: ?66 year old female with past medical history of DVT/PE on Xarelto, obesity, chronic lymphedema, hypertension, diabetes mellitus, diastolic heart failure, recent COVID infection last month admitted 3/7 with septic shock from either left-sided hydronephrosis versus pneumonia.  Patient started on fluids and antibiotics and pressor support.  Patient seen by urology and underwent a cystoscopy on 3/7 with left ureteral stent placement.  Following admission, urine cultures grew out Proteus and blood cultures grew out Proteus and Enterobacter.  Currently being diuresed. ? ?Assessment and Plan: ?Assessment and Plan: ?* Septic shock ([place]) ?Improving.  White blood cell count, lactic acid level and procalcitonin slowly trending downward.  Continue pressor support plus IV fluids and antibiotics.  Felt to be secondary to urinary tract infection.  Blood and urine cultures positive for Proteus and Enterobacter.  With procalcitonin strongly trending downward, discussed with pharmacy and will plan to stop Rocephin after 4 days and continue amoxicillin p.o. for total of 10 days of therapy.  Procalcitonin continues to trend downward. ? ?Volume overload ?As stated below, patient not with heart failure.  Echocardiogram unrevealing.  However, lungs do sound rhonchorous.  And with Lasix, she has diuresed almost 19 L and is more than -12.9 L deficient ? ?Urinary tract infection ?Secondary to Proteus.  As above. ? ?Thrombocytopenia ([place]) ??  Secondary to sepsis.  Continue to follow closely.  Slowly improving ? ?Anemia ?Suspect acute and chronic with multifactorial.  Hemoglobin with slight improvement from previous day.  Status post 1 unit packed red blood cells ? ?AKI (acute kidney injury) ([place]) ?Secondary to septic shock.  Improved with IV  fluids. ? ?Essential hypertension ?Blood pressure trending upward likely secondary to IV fluids.  Note that BNP was elevated on admission and echocardiogram was checked noting normal ejection fraction and normal diastolic function.  We will give 1 dose of Lasix for any volume overload.  Patient does not have CHF. ? ?Morbid obesity ([place]) ?Meets criteria BMI greater than 40 ? ? ? ? ? ? ?Body mass index is 46.21 kg/m?Marland Kitchen  ?Nutrition Problem: Inadequate oral intake ?Etiology: acute illness ?Pressure Injury 06/20/21 Buttocks Posterior;Lower;Right Stage 2 -  Partial thickness loss of dermis presenting as a shallow open injury with a red, pink wound bed without slough. small pink/red shallow opening (Active)  ?06/20/21 2100  ?Location: Buttocks  ?Location Orientation: Posterior;Lower;Right  ?Staging: Stage 2 -  Partial thickness loss of dermis presenting as a shallow open injury with a red, pink wound bed without slough.  ?Wound Description (Comments): small pink/red shallow opening  ?Present on Admission: Yes  ?  ? ?Consultants: ?Critical care ?Urology ? ?Procedures: ?Cystoscopy status post left ureteral stent placement 3/8 ? ?Antimicrobials: ?IV Rocephin 3/7-3/10 ?P.o. amoxicillin 3/11-3/16 ? ?Code Status: Full code ? ? ?Subjective: Minimal back pain ? ?Objective: ?Vital signs stable ?Vitals:  ? 09/17/21 0526 09/17/21 0746  ?BP: (!) 126/59 122/60  ?Pulse: 92 94  ?Resp: 17 (!) 22  ?Temp: 97.8 ?F (36.6 ?C) 98.8 ?F (37.1 ?C)  ?SpO2: 93% 92%  ? ? ?Intake/Output Summary (Last 24 hours) at 09/17/2021 1414 ?Last data filed at 09/17/2021 1407 ?Gross per 24 hour  ?Intake 960 ml  ?Output 4425 ml  ?Net -3465 ml  ? ? ?Filed Weights  ? 09/14/21 0451 09/15/21 0500 09/16/21 0500  ?Weight: 95.5 kg 100.2 kg 100.3 kg  ? ?  Body mass index is 46.21 kg/m?. ? ?Exam: ? ?General: Alert and oriented x2, no acute distress ?HEENT: Normocephalic, atraumatic, mucous membranes slightly dry ?Cardiovascular: Regular rate and rhythm, S1-S2 ?Respiratory:  Better inspiratory effort, decreased breath sounds throughout ?Abdomen: Soft, nondistended, normoactive bowel sounds ?Musculoskeletal: No clubbing or cyanosis, trace pitting edema ?Skin: No skin breaks, tears or lesions ?Psychiatry: Appropriate, no evidence of psychoses ?Neurology: No focal deficits ? ?Data Reviewed: ?Noted procalcitonin of 4.76, down from 7.81.  Renal function stable despite aggressive diuresis. ? ?Disposition:  ?Status is: Inpatient ?Remains inpatient appropriate because: Diuresis, stabilization of infection.  Discharge home 3/14 with home health. ?  ? ?Family Communication: Left message for family ?DVT Prophylaxis: ?Place and maintain sequential compression device Start: 09/11/21 1047 ?SCDs Start: 09/11/21 0646 ?rivaroxaban (XARELTO) tablet 20 mg  ? ? ?Author: ?Annita Brod ,MD ?09/17/2021 2:14 PM ? ?To reach On-call, see care teams to locate the attending and reach out via [email or web address]. ?Between 7PM-7AM, please contact night-coverage ?If you still have difficulty reaching the attending provider, please page the [facility] (Director on Call) for [facility] on amion for assistance. ? ?

## 2021-09-17 NOTE — Care Management Important Message (Signed)
Important Message ? ?Patient Details  ?Name: Courtney Mack ?MRN: [id number] ?Date of Birth: 01-18-1956 ? ? ?Medicare Important Message Given:  Yes ? ? ? ? ?[name] A Edvardo Honse ?09/17/2021, 11:27 AM ?

## 2021-09-17 NOTE — Progress Notes (Signed)
Physical Therapy Treatment ?Patient Details ?Name: Courtney Mack ?MRN: [id number] ?DOB: 1955-09-26 ?Today's Date: 09/17/2021 ? ? ?History of Present Illness Dianely Krehbiel is a 63yoF who comes to [facility] on 09/11/[facility] with abd pain and in code sepsis.  PMH: PE/DVT xarelto, recurent GIB, HF, DM. Pt is status post discharge from hospital less than 1 month ago with presyncope, chest pain and hypoglycemic episode and was found to have a GI bleed while on Xarelto status post GI evaluation.  During that admission in February 2023 she was also COVID-19 positive. ? ?  ?PT Comments  ? ? Pt in bed upon entry, reports not feel good today in general but provides limited detail. Pt did not get out of bed to chair over the weekend, unclear why. Pt agreeable to session. ROM assist of legs in bed first to maximize tolerance and activation. Continued with transfers training, AMB tolerance. Pt has pretty severe noted hip joint ROM previously suspected but not assessed, speaks to some of her movement patterns and strategies. Left in bed sitting up with feet elevated.  ? ?  ?Recommendations for follow up therapy are one component of a multi-disciplinary discharge planning process, led by the attending physician.  Recommendations may be updated based on patient status, additional functional criteria and insurance authorization. ? ?Follow Up Recommendations ? Skilled nursing-short term rehab (<3 hours/day) ?  ?  ?Assistance Recommended at Discharge Intermittent Supervision/Assistance  ?Patient can return home with the following A lot of help with walking and/or transfers;A lot of help with bathing/dressing/bathroom;A little help with bathing/dressing/bathroom;Two people to help with walking and/or transfers ?  ?Equipment Recommendations ?    ?  ?Recommendations for Other Services   ? ? ?  ?Precautions / Restrictions Precautions ?Precautions: Fall ?Restrictions ?Weight Bearing Restrictions: No  ?  ? ?Mobility ? Bed Mobility ?Overal bed  mobility: Needs Assistance ?Bed Mobility: Sit to Supine, Supine to Sit ?  ?  ?Supine to sit: Supervision ?Sit to supine: Mod assist ?  ?General bed mobility comments: maxA of legs; once in bed, sturggles with readjustment ?  ? ?Transfers ?Overall transfer level: Needs assistance ?Equipment used: Rolling walker (2 wheels) ?Transfers: Sit to/from Stand ?Sit to Stand: Mod assist ?  ?  ?  ?  ?  ?  ?  ? ?Ambulation/Gait ?Ambulation/Gait assistance: Min guard, Supervision ?Gait Distance (Feet): 40 Feet ?Assistive device: Rolling walker (2 wheels) ?Gait Pattern/deviations: WFL(Within Functional Limits) ?  ?  ?  ?General Gait Details: slower this date, but manages RW safely in straight plane and in turning. ? ? ?Stairs ?  ?  ?  ?  ?  ? ? ?Wheelchair Mobility ?  ? ?Modified Rankin (Stroke Patients Only) ?  ? ? ?  ?Balance   ?  ?  ?  ?  ?  ?  ?  ?  ?  ?  ?  ?  ?  ?  ?  ?  ?  ?  ?  ? ?  ?Cognition Arousal/Alertness: Awake/alert ?Behavior During Therapy: [facility] for tasks assessed/performed ?Overall Cognitive Status: Within Functional Limits for tasks assessed ?  ?  ?  ?  ?  ?  ?  ?  ?  ?  ?  ?  ?  ?  ?  ?  ?  ?  ?  ? ?  ?Exercises Other Exercises ?Other Exercises: STS from elevated EOB 1x10 ?Other Exercises: Supine lateral heel slides x15 bilat, modA of legs ?Other Exercises: Supine SAQ 1x15  bilat ?Other Exercises: Supine heel slides in sagittal planes 1x10 bilat (has significant hip flexion ROM limitations (<50 degrees) ? ?  ?General Comments   ?  ?  ? ?Pertinent Vitals/Pain Pain Assessment ?Pain Assessment: No/denies pain  ? ? ?Home Living   ?  ?  ?  ?  ?  ?  ?  ?  ?  ?   ?  ?Prior Function    ?  ?  ?   ? ?PT Goals (current goals can now be found in the care plan section) Acute Rehab PT Goals ?Patient Stated Goal: regain strength ?PT Goal Formulation: With patient ?Time For Goal Achievement: 09/26/21 ?Potential to Achieve Goals: Fair ?Progress towards PT goals: Progressing toward goals ? ?  ?Frequency ? ? ? Min 2X/week ? ? ? ?   ?PT Plan Current plan remains appropriate  ? ? ?Co-evaluation   ?  ?  ?  ?  ? ?  ?AM-PAC PT "6 Clicks" Mobility   ?Outcome Measure ? Help needed turning from your back to your side while in a flat bed without using bedrails?: A Little ?Help needed moving from lying on your back to sitting on the side of a flat bed without using bedrails?: Total ?Help needed moving to and from a bed to a chair (including a wheelchair)?: A Lot ?Help needed standing up from a chair using your arms (e.g., wheelchair or bedside chair)?: A Lot ?Help needed to walk in hospital room?: A Little ?Help needed climbing 3-5 steps with a railing? : A Lot ?6 Click Score: 13 ? ?  ?End of Session Equipment Utilized During Treatment: Gait belt ?Activity Tolerance: Patient tolerated treatment well;No increased pain;Patient limited by fatigue ?Patient left: in bed;with call bell/phone within reach;with bed alarm set ?Nurse Communication: Mobility status ?PT Visit Diagnosis: Unsteadiness on feet (R26.81);Difficulty in walking, not elsewhere classified (R26.2);Other abnormalities of gait and mobility (R26.89) ?  ? ? ?Time: 0254-2706 ?PT Time Calculation (min) (ACUTE ONLY): 30 min ? ?Charges:  $Gait Training: 8-22 mins ?$Therapeutic Exercise: 8-22 mins          ?          ?12:57 PM, 09/17/21 ?Etta Grandchild, PT, DPT ?Physical Therapist - [place] ?[facility]  ?[phone] (ASCOM)  ? ? ?Cagney Steenson C ?09/17/2021, 12:57 PM ? ?

## 2021-09-18 DIAGNOSIS — I5033 Acute on chronic diastolic (congestive) heart failure: Secondary | ICD-10-CM

## 2021-09-18 LAB — BASIC METABOLIC PANEL
Anion gap: 6 (ref 5–15)
BUN: 24 mg/dL — ABNORMAL HIGH (ref 8–23)
CO2: 29 mmol/L (ref 22–32)
Calcium: 8.2 mg/dL — ABNORMAL LOW (ref 8.9–10.3)
Chloride: 102 mmol/L (ref 98–111)
Creatinine, Ser: 0.65 mg/dL (ref 0.44–1.00)
GFR, Estimated: >60 mL/min (ref >60)
Glucose, Bld: 120 mg/dL — ABNORMAL HIGH (ref 70–99)
Potassium: 3.8 mmol/L (ref 3.5–5.1)
Sodium: 137 mmol/L (ref 135–145)

## 2021-09-18 LAB — CBC
HCT: 25.4 % — ABNORMAL LOW (ref 36.0–46.0)
Hemoglobin: 7.7 g/dL — ABNORMAL LOW (ref 12.0–15.0)
MCH: 25.7 pg — ABNORMAL LOW (ref 26.0–34.0)
MCHC: 30.3 g/dL (ref 30.0–36.0)
MCV: 84.7 fL (ref 80.0–100.0)
Platelets: 207 10*3/uL (ref 150–400)
RBC: 3 MIL/uL — ABNORMAL LOW (ref 3.87–5.11)
RDW: 18.3 % — ABNORMAL HIGH (ref 11.5–15.5)
WBC: 5.3 10*3/uL (ref 4.0–10.5)
nRBC: 0 % (ref 0.0–0.2)

## 2021-09-18 LAB — GLUCOSE, CAPILLARY
Glucose-Capillary: 90 mg/dL (ref 70–99)
Glucose-Capillary: 106 mg/dL — ABNORMAL HIGH (ref 70–99)

## 2021-09-18 LAB — PROCALCITONIN: Procalcitonin: 3.12 ng/mL

## 2021-09-18 MED ORDER — FUROSEMIDE 20 MG PO TABS: 20.0000 mg | ORAL_TABLET | Freq: Every day | ORAL | 11 refills | Status: DC

## 2021-09-18 MED ORDER — AMOXICILLIN 500 MG PO CAPS: 1000.0000 mg | ORAL_CAPSULE | Freq: Three times a day (TID) | ORAL | 0 refills | Status: AC

## 2021-09-18 NOTE — Discharge Summary (Signed)
?Physician Discharge Summary ?  ?Patient: Courtney Mack MRN: [id number] DOB: 10/23/1955  ?Admit date:     09/11/2021  ?Discharge date: 09/18/21  ?Discharge Physician: Annita Brod  ? ?PCP: [facility] [place]  ? ?Recommendations at discharge:  ? ?Medication change: Cozaar discontinued ?New medication: Lasix 20 mg p.o. daily ?New medication: Amoxicillin 1000 mg p.o. every 8 hours with next dose at 10 PM tonight for 10 more doses and final dose on 3/17 at 10 PM ?Patient going home with home health ?Patient will follow-up with urology as outpatient in the next 7 to 10 days for outpatient ureteroscopy if stone is still present and for stent exchange ? ?Discharge Diagnoses: ?Active Problems: ?  Urinary tract infection ?  Thrombocytopenia ([place]) ?  Anemia ?  Pulmonary embolism ([place]) ?  Essential hypertension ?  Diabetes mellitus without complication ([place]) ?  Endometrial cancer ([place]) ?  Morbid obesity ([place]) ? ?Principal Problem (Resolved): ?  Acute on chronic diastolic CHF (congestive heart failure) ([place]) ?Resolved Problems: ?  Septic shock ([place]) ?  AKI (acute kidney injury) ([place]) ? ?Hospital Course: ?66 year old female with past medical history of DVT/PE on Xarelto, obesity, chronic lymphedema, hypertension, diabetes mellitus, diastolic heart failure, recent COVID infection last month admitted 3/7 with septic shock from either left-sided hydronephrosis versus pneumonia.  Patient treated with fluids and antibiotics and pressor support.  Patient seen by urology and underwent a cystoscopy on 3/7 with left ureteral stent placement.  Following admission, urine cultures grew out Proteus and blood cultures grew out Proteus and Enterobacter.  Following stabilization, patient volume overloaded and required aggressive diuresis.  By day of discharge, patient has diuresed over 21 L. ? ?Assessment and Plan: ?* Acute on chronic diastolic CHF (congestive heart failure) (HCC)-resolved as of 09/18/2021 ?Following  aggressive fluid resuscitation, patient noted to be rhonchorous.  Echocardiogram done last year noted grade 1 diastolic dysfunction.  Curiously echocardiogram done this admission noted normal diastolic function however, with Lasix, patient has diuresed over 21 L.  I suspect that she was already volume overloaded prior to coming in and worsened with fluid resuscitation needed for sepsis.  Looks to be now euvolemic.  Would favor discharging her on daily low-dose Lasix. ? ?Septic shock (HCC)-resolved as of 09/18/2021 ?Improving.  White blood cell count, lactic acid level and procalcitonin slowly trending downward.  Continue pressor support plus IV fluids and antibiotics.  Felt to be secondary to urinary tract infection.  Blood and urine cultures positive for Proteus and Enterobacter.  With procalcitonin strongly trending downward, discussed with pharmacy and will plan to stop Rocephin after 4 days and continue amoxicillin p.o. for total of 10 days of therapy.  Procalcitonin continues to trend downward. ? ?Urinary tract infection ?Secondary to Proteus.  As above. ? ?Thrombocytopenia ([place]) ?Resolved.  Felt to be secondary to sepsis.  By day of discharge, platelet count of 207. ? ?Anemia ?Suspect acute and chronic with multifactorial.  Hemoglobin with slight improvement from previous day.  Status post 1 unit packed red blood cells ? ?AKI (acute kidney injury) (HCC)-resolved as of 09/18/2021 ?Secondary to septic shock.  Resolved with IV fluids. ? ?Pulmonary embolism ([place]) ?Continued on Xarelto. ? ?Essential hypertension ?Blood pressure initially had been upward likely secondary to IV fluids.  Note that BNP was elevated on admission.  Following aggressive diuresis, blood pressure is now quite normal.  She normally is on Cozaar which she has not been getting here.  Given her softer blood pressures, would favor discontinuing  her Cozaar because of her renal function issues and discharging her on daily low-dose Lasix. ? ?Morbid  obesity ([place]) ?Meets criteria BMI greater than 40 ? ? ? ? ?  ? ? ?Consultants: Urology, critical care ?Procedures performed: Cystoscopy status post left ureteral stent placement 3/8 ?Echocardiogram: Preserved ejection fraction, normal diastolic function ?Disposition: Home ?Diet recommendation:  ?Discharge Diet Orders (From admission, onward)  ? ?  Start     Ordered  ? 09/18/21 0000  Diet - low sodium heart healthy       ? 09/18/21 1319  ? 09/18/21 0000  Diet Carb Modified       ? 09/18/21 1319  ? ?  ?  ? ?  ? ?Carb modified diet ?DISCHARGE MEDICATION: ?Allergies as of 09/18/2021   ?No Known Allergies ?  ? ?  ?Medication List  ?  ? ?STOP taking these medications   ? ?losartan 25 MG tablet ?Commonly known as: COZAAR ?  ? ?  ? ?TAKE these medications   ? ?amoxicillin 500 MG capsule ?Commonly known as: AMOXIL ?Take 2 capsules (1,000 mg total) by mouth every 8 (eight) hours for 10 doses. ?  ?cyanocobalamin 1000 MCG tablet ?Take 1 tablet (1,000 mcg total) by mouth daily. ?  ?Dulera 100-5 MCG/ACT Aero ?Generic drug: mometasone-formoterol ?Inhale 2 puffs into the lungs 2 (two) times daily. ?  ?furosemide 20 MG tablet ?Commonly known as: Lasix ?Take 1 tablet (20 mg total) by mouth daily. ?  ?Iron 325 (65 Fe) MG Tabs ?Take 325 mg by mouth every other day. ?  ?loperamide 2 MG capsule ?Commonly known as: IMODIUM ?Take 1 capsule (2 mg total) by mouth as needed for diarrhea or loose stools. ?  ?multivitamin with minerals Tabs tablet ?Take 1 tablet by mouth daily. ?  ?pantoprazole 40 MG tablet ?Commonly known as: PROTONIX ?Take 1 tablet (40 mg total) by mouth daily. ?  ?rivaroxaban 20 MG Tabs tablet ?Commonly known as: XARELTO ?Take 1 tablet (20 mg total) by mouth daily with supper. ?  ?rosuvastatin 10 MG tablet ?Commonly known as: CRESTOR ?Take 1 tablet (10 mg total) by mouth daily. ?  ?simethicone 80 MG chewable tablet ?Commonly known as: MYLICON ?Chew 1 tablet (80 mg total) by mouth 4 (four) times daily. ?  ? ?  ? ? ?Discharge  Exam: ?Filed Weights  ? 09/15/21 0500 09/16/21 0500 09/18/21 0615  ?Weight: 100.2 kg 100.3 kg 91.9 kg  ? ?General: Alert and oriented x3, no acute distress ?Cardiovascular: Regular rate and rhythm, S1-S2 ?Lungs: Clear to auscultation bilaterally ? ?Condition at discharge: good ? ?The results of significant diagnostics from this hospitalization (including imaging, microbiology, ancillary and laboratory) are listed below for reference.  ? ?Imaging Studies: ?CT CHEST ABDOMEN PELVIS W CONTRAST ? ?Result Date: 09/11/2021 ?CLINICAL DATA:  Fever with nausea, vomiting and diarrhea. EXAM: CT CHEST, ABDOMEN, AND PELVIS WITH CONTRAST TECHNIQUE: Multidetector CT imaging of the chest, abdomen and pelvis was performed following the standard protocol during bolus administration of intravenous contrast. RADIATION DOSE REDUCTION: This exam was performed according to the departmental dose-optimization program which includes automated exposure control, adjustment of the mA and/or kV according to patient size and/or use of iterative reconstruction technique. CONTRAST:  124m OMNIPAQUE IOHEXOL 300 MG/ML  SOLN COMPARISON:  August 14, 2021 FINDINGS: CT CHEST FINDINGS Cardiovascular: There is mild to moderate severity calcification of the aortic arch, without evidence of aortic aneurysm or dissection. Normal heart size. A very small amount of pericardial fluid is noted. Mediastinum/Nodes: No  enlarged mediastinal, hilar, or axillary lymph nodes. Thyroid gland, trachea, and esophagus demonstrate no significant findings. Lungs/Pleura: Mild atelectasis is seen along the anteromedial aspect of the left upper lobe and posterior aspect of the left lung base. There is no evidence of a pleural effusion or pneumothorax. Musculoskeletal: Degenerative changes are seen throughout the thoracic spine CT ABDOMEN PELVIS FINDINGS Hepatobiliary: No focal liver abnormality is seen. No gallstones, gallbladder wall thickening, or biliary dilatation. Pancreas:  Unremarkable. No pancreatic ductal dilatation or surrounding inflammatory changes. Spleen: Normal in size without focal abnormality. Adrenals/Urinary Tract: Adrenal glands are unremarkable. Kidneys are norm

## 2021-09-18 NOTE — Assessment & Plan Note (Signed)
Continued on Xarelto. ?

## 2021-09-20 ENCOUNTER — Telehealth: Payer: Self-pay | Admitting: Urology

## 2021-09-20 NOTE — Clinical Social Work Note (Cosign Needed)
Verbal order obtained from Dr Manuella Ghazi for pt to get [facility] PT. ? Loletha Grayer, Latanya Presser ?[phone] ? ?

## 2021-09-20 NOTE — Telephone Encounter (Signed)
She needs an appointment with Dr.Stoioff with [facility] prior for follow up from the hospital.  ?

## 2021-09-21 ENCOUNTER — Other Ambulatory Visit: Payer: Self-pay | Admitting: *Deleted

## 2021-09-21 DIAGNOSIS — N200 Calculus of kidney: Secondary | ICD-10-CM

## 2021-09-21 NOTE — Telephone Encounter (Signed)
Patient's daughter returned the call.  Follow up appointment made. ? ?Please place order for [facility]. ?

## 2021-09-21 NOTE — Telephone Encounter (Signed)
Left message on voice mail to call and schedule  ?

## 2021-09-27 ENCOUNTER — Encounter: Payer: Self-pay | Admitting: Urology

## 2021-09-27 ENCOUNTER — Ambulatory Visit (INDEPENDENT_AMBULATORY_CARE_PROVIDER_SITE_OTHER): Payer: Medicare (Managed Care) | Admitting: Urology

## 2021-09-27 ENCOUNTER — Other Ambulatory Visit: Payer: Self-pay

## 2021-09-27 VITALS — BP 114/76 | HR 84 | Ht <= 58 in | Wt 220.0 lb

## 2021-09-27 DIAGNOSIS — N201 Calculus of ureter: Secondary | ICD-10-CM

## 2021-09-27 NOTE — H&P (View-Only) (Signed)
? ?09/27/2021 ?8:14 PM  ? ?Courtney Mack ?July 01, 1956 ?[id number] ? ?Referring provider: [facility] [place] ?[place] ?[place],  [place] [address] ? ?Chief Complaint  ?Patient presents with  ? Follow-up  ? ? ?HPI: ?66 y.o. female presents in follow-up of recent hospitalization.  She presents today with her daughter ? ?Admitted [facility] 09/11/2021 with hypotension, fever 104 degrees and lactate 4.0 ?UA normal however CT with a 2 mm left distal ureteral calculus with moderate hydronephrosis/hydroureter ?No other obvious source of sepsis was identified ?Underwent urgent cystoscopy with left ureteral stent placement ?Intraoperative findings remarkable for turbid urine collected from the renal pelvis with Gram stain positive for gram-negative rods and urine culture subsequently growing Proteus and blood cultures are growing Proteus and Enterobacter ?Clinical improvement after stent placement and she presents today for discussion of definitive stone treatment ?She has been doing well since discharge.  Does have mild to moderate stent symptoms ? ? ?PMH: ?Past Medical History:  ?Diagnosis Date  ? Diabetes mellitus without complication ([place])   ? DVT (deep venous thrombosis) ([place]) 09/06/2020  ? High cholesterol   ? Hx of blood clots   ? Hypertension   ? IDA (iron deficiency anemia) 09/21/2020  ? ? ?Surgical History: ?Past Surgical History:  ?Procedure Laterality Date  ? COLONOSCOPY N/A 06/21/2021  ? Procedure: COLONOSCOPY;  Surgeon: Toledo, Benay Pike, MD;  Location: [facility] ENDOSCOPY;  Service: Gastroenterology;  Laterality: N/A;  ? CYSTOSCOPY W/ URETERAL STENT PLACEMENT Left 09/11/2021  ? Procedure: CYSTOSCOPY WITH RETROGRADE PYELOGRAM/URETERAL STENT PLACEMENT;  Surgeon: Abbie Sons, MD;  Location: [facility] ORS;  Service: Urology;  Laterality: Left;  ? ENTEROSCOPY N/A 08/17/2021  ? Procedure: ENTEROSCOPY;  Surgeon: Lin Landsman, MD;  Location: [facility] ENDOSCOPY;  Service: Gastroenterology;   Laterality: N/A;  ? ESOPHAGOGASTRODUODENOSCOPY N/A 06/21/2021  ? Procedure: ESOPHAGOGASTRODUODENOSCOPY (EGD);  Surgeon: Toledo, Benay Pike, MD;  Location: [facility] ENDOSCOPY;  Service: Gastroenterology;  Laterality: N/A;  ? ESOPHAGOGASTRODUODENOSCOPY (EGD) WITH PROPOFOL N/A 08/17/2021  ? Procedure: ESOPHAGOGASTRODUODENOSCOPY (EGD) WITH PROPOFOL;  Surgeon: Lin Landsman, MD;  Location: [facility] ENDOSCOPY;  Service: Gastroenterology;  Laterality: N/A;  ? GIVENS CAPSULE STUDY N/A 06/22/2021  ? Procedure: GIVENS CAPSULE STUDY;  Surgeon: Toledo, Benay Pike, MD;  Location: [facility] ENDOSCOPY;  Service: Gastroenterology;  Laterality: N/A;  ? IVC FILTER INSERTION N/A 08/18/2020  ? Procedure: IVC FILTER INSERTION;  Surgeon: Katha Cabal, MD;  Location: [place] CV LAB;  Service: Cardiovascular;  Laterality: N/A;  ? PERIPHERAL VASCULAR THROMBECTOMY Bilateral 01/03/2021  ? Procedure: PERIPHERAL VASCULAR THROMBECTOMY;  Surgeon: Algernon Huxley, MD;  Location: [place] CV LAB;  Service: Cardiovascular;  Laterality: Bilateral;  ? PULMONARY THROMBECTOMY N/A 08/18/2020  ? Procedure: PULMONARY THROMBECTOMY;  Surgeon: Katha Cabal, MD;  Location: [place] [facility] LAB;  Service: Cardiovascular;  Laterality: N/A;  ? TUBAL LIGATION    ? VISCERAL ANGIOGRAPHY N/A 07/18/2021  ? Procedure: VISCERAL ANGIOGRAPHY;  Surgeon: Algernon Huxley, MD;  Location: [place] [facility] LAB;  Service: Cardiovascular;  Laterality: N/A;  ? ? ?Home Medications:  ?Allergies as of 09/27/2021   ?No Known Allergies ?  ? ?  ?Medication List  ?  ? ?  ? Accurate as of September 27, 2021  8:14 PM. If you have any questions, ask your nurse or doctor.  ?  ?  ? ?  ? ?cyanocobalamin 1000 MCG tablet ?Take 1 tablet (1,000 mcg total) by mouth daily. ?  ?Dulera 100-5 MCG/ACT Aero ?Generic drug: mometasone-formoterol ?Inhale  2 puffs into the lungs 2 (two) times daily. ?  ?furosemide 20 MG tablet ?Commonly known as: Lasix ?Take 1 tablet (20 mg total) by mouth daily. ?  ?Iron 325  (65 Fe) MG Tabs ?Take 325 mg by mouth every other day. ?  ?loperamide 2 MG capsule ?Commonly known as: IMODIUM ?Take 1 capsule (2 mg total) by mouth as needed for diarrhea or loose stools. ?  ?multivitamin with minerals Tabs tablet ?Take 1 tablet by mouth daily. ?  ?pantoprazole 40 MG tablet ?Commonly known as: PROTONIX ?Take 1 tablet (40 mg total) by mouth daily. ?  ?rivaroxaban 20 MG Tabs tablet ?Commonly known as: XARELTO ?Take 1 tablet (20 mg total) by mouth daily with supper. ?  ?rosuvastatin 10 MG tablet ?Commonly known as: CRESTOR ?Take 1 tablet (10 mg total) by mouth daily. ?  ?simethicone 80 MG chewable tablet ?Commonly known as: MYLICON ?Chew 1 tablet (80 mg total) by mouth 4 (four) times daily. ?  ? ?  ? ? ?Allergies: No Known Allergies ? ?Family History: ?Family History  ?Problem Relation Age of Onset  ? Hypertension Mother   ? Diabetes Mother   ? Diabetes Father   ? Hypertension Father   ? High Cholesterol Father   ? Congestive Heart Failure Father   ? Breast cancer Cousin   ? ? ?Social History:  reports that she quit smoking about 41 years ago. Her smoking use included cigarettes. She has a 0.13 pack-year smoking history. She has never used smokeless tobacco. She reports that she does not currently use alcohol. She reports that she does not currently use drugs. ? ? ?Physical Exam: ?BP 114/76   Pulse 84   Ht '4\' 10"'$  (1.473 m)   Wt 220 lb (99.8 kg)   BMI 45.98 kg/m?   ?Constitutional:  Alert, No acute distress. ?HEENT: [place] AT, moist mucus membranes.  Trachea midline, no masses. ?Respiratory: Normal respiratory effort, no increased work of breathing. ?GI: Abdomen is soft, nontender, nondistended, no abdominal masses ?GU: No CVA tenderness ?Psychiatric: Normal mood and affect. ? ? ?Assessment & Plan:   ? ?1.  Left distal ureteral calculus ?2 mm left distal ureteral calculus status post stent placement for sepsis from a urinary source ?Based on distal location and stone size she may have passed this small  calculus ?I have recommended cystoscopy with stent removal under sedation and left ureteroscopy with stone basketing if the calculus is still present ?The procedure was discussed in detail including potential risks of bleeding, infection/sepsis and ureteral injury.  All questions were answered and she desires to proceed. ? ? ?Abbie Sons, MD ? ?[place] ?[address] ?[place], [place] [address] ?[phone] ? ?

## 2021-09-27 NOTE — Progress Notes (Signed)
? ?09/27/2021 ?8:14 PM  ? ?Courtney Mack ?[birth year] ?[id number] ? ?Referring provider: [facility] [place] [address] ?[place],  [place] [address] ? ?Chief Complaint  ?Patient presents with  ? Follow-up  ? ? ?HPI: ?66 y.o. female presents in follow-up of recent hospitalization.  She presents today with her daughter ? ?Admitted [facility] 09/11/2021 with hypotension, fever 104 degrees and lactate 4.0 ?UA normal however CT with a 2 mm left distal ureteral calculus with moderate hydronephrosis/hydroureter ?No other obvious source of sepsis was identified ?Underwent urgent cystoscopy with left ureteral stent placement ?Intraoperative findings remarkable for turbid urine collected from the renal pelvis with Gram stain positive for gram-negative rods and urine culture subsequently growing Proteus and blood cultures are growing Proteus and Enterobacter ?Clinical improvement after stent placement and she presents today for discussion of definitive stone treatment ?She has been doing well since discharge.  Does have mild to moderate stent symptoms ? ? ?PMH: ?Past Medical History:  ?Diagnosis Date  ? Diabetes mellitus without complication ([place])   ? DVT (deep venous thrombosis) ([place]) 09/06/2020  ? High cholesterol   ? Hx of blood clots   ? Hypertension   ? IDA (iron deficiency anemia) 09/21/2020  ? ? ?Surgical History: ?Past Surgical History:  ?Procedure Laterality Date  ? COLONOSCOPY N/A 06/21/2021  ? Procedure: COLONOSCOPY;  Surgeon: Toledo, Benay Pike, MD;  Location: [facility] ENDOSCOPY;  Service: Gastroenterology;  Laterality: N/A;  ? CYSTOSCOPY W/ URETERAL STENT PLACEMENT Left 09/11/2021  ? Procedure: CYSTOSCOPY WITH RETROGRADE PYELOGRAM/URETERAL STENT PLACEMENT;  Surgeon: Abbie Sons, MD;  Location: [facility] ORS;  Service: Urology;  Laterality: Left;  ? ENTEROSCOPY N/A 08/17/2021  ? Procedure: ENTEROSCOPY;  Surgeon: Lin Landsman, MD;  Location: [facility] ENDOSCOPY;  Service: Gastroenterology;   Laterality: N/A;  ? ESOPHAGOGASTRODUODENOSCOPY N/A 06/21/2021  ? Procedure: ESOPHAGOGASTRODUODENOSCOPY (EGD);  Surgeon: Toledo, Benay Pike, MD;  Location: [facility] ENDOSCOPY;  Service: Gastroenterology;  Laterality: N/A;  ? ESOPHAGOGASTRODUODENOSCOPY (EGD) WITH PROPOFOL N/A 08/17/2021  ? Procedure: ESOPHAGOGASTRODUODENOSCOPY (EGD) WITH PROPOFOL;  Surgeon: Lin Landsman, MD;  Location: [facility] ENDOSCOPY;  Service: Gastroenterology;  Laterality: N/A;  ? GIVENS CAPSULE STUDY N/A 06/22/2021  ? Procedure: GIVENS CAPSULE STUDY;  Surgeon: Toledo, Benay Pike, MD;  Location: [facility] ENDOSCOPY;  Service: Gastroenterology;  Laterality: N/A;  ? IVC FILTER INSERTION N/A 08/18/2020  ? Procedure: IVC FILTER INSERTION;  Surgeon: Katha Cabal, MD;  Location: [place] CV LAB;  Service: Cardiovascular;  Laterality: N/A;  ? PERIPHERAL VASCULAR THROMBECTOMY Bilateral 01/03/2021  ? Procedure: PERIPHERAL VASCULAR THROMBECTOMY;  Surgeon: Algernon Huxley, MD;  Location: [place] CV LAB;  Service: Cardiovascular;  Laterality: Bilateral;  ? PULMONARY THROMBECTOMY N/A 08/18/2020  ? Procedure: PULMONARY THROMBECTOMY;  Surgeon: Katha Cabal, MD;  Location: [place] CV LAB;  Service: Cardiovascular;  Laterality: N/A;  ? TUBAL LIGATION    ? VISCERAL ANGIOGRAPHY N/A 07/18/2021  ? Procedure: VISCERAL ANGIOGRAPHY;  Surgeon: Algernon Huxley, MD;  Location: [place] CV LAB;  Service: Cardiovascular;  Laterality: N/A;  ? ? ?Home Medications:  ?Allergies as of 09/27/2021   ?No Known Allergies ?  ? ?  ?Medication List  ?  ? ?  ? Accurate as of September 27, 2021  8:14 PM. If you have any questions, ask your nurse or doctor.  ?  ?  ? ?  ? ?cyanocobalamin 1000 MCG tablet ?Take 1 tablet (1,000 mcg total) by mouth daily. ?  ?Dulera 100-5 MCG/ACT Aero ?Generic drug: mometasone-formoterol ?Inhale  2 puffs into the lungs 2 (two) times daily. ?  ?furosemide 20 MG tablet ?Commonly known as: Lasix ?Take 1 tablet (20 mg total) by mouth daily. ?  ?Iron 325  (65 Fe) MG Tabs ?Take 325 mg by mouth every other day. ?  ?loperamide 2 MG capsule ?Commonly known as: IMODIUM ?Take 1 capsule (2 mg total) by mouth as needed for diarrhea or loose stools. ?  ?multivitamin with minerals Tabs tablet ?Take 1 tablet by mouth daily. ?  ?pantoprazole 40 MG tablet ?Commonly known as: PROTONIX ?Take 1 tablet (40 mg total) by mouth daily. ?  ?rivaroxaban 20 MG Tabs tablet ?Commonly known as: XARELTO ?Take 1 tablet (20 mg total) by mouth daily with supper. ?  ?rosuvastatin 10 MG tablet ?Commonly known as: CRESTOR ?Take 1 tablet (10 mg total) by mouth daily. ?  ?simethicone 80 MG chewable tablet ?Commonly known as: MYLICON ?Chew 1 tablet (80 mg total) by mouth 4 (four) times daily. ?  ? ?  ? ? ?Allergies: No Known Allergies ? ?Family History: ?Family History  ?Problem Relation Age of Onset  ? Hypertension Mother   ? Diabetes Mother   ? Diabetes Father   ? Hypertension Father   ? High Cholesterol Father   ? Congestive Heart Failure Father   ? Breast cancer Cousin   ? ? ?Social History:  reports that she quit smoking about 41 years ago. Her smoking use included cigarettes. She has a 0.13 pack-year smoking history. She has never used smokeless tobacco. She reports that she does not currently use alcohol. She reports that she does not currently use drugs. ? ? ?Physical Exam: ?BP 114/76   Pulse 84   Ht '4\' 10"'$  (1.473 m)   Wt 220 lb (99.8 kg)   BMI 45.98 kg/m?   ?Constitutional:  Alert, No acute distress. ?HEENT: [place] AT, moist mucus membranes.  Trachea midline, no masses. ?Respiratory: Normal respiratory effort, no increased work of breathing. ?GI: Abdomen is soft, nontender, nondistended, no abdominal masses ?GU: No CVA tenderness ?Psychiatric: Normal mood and affect. ? ? ?Assessment & Plan:   ? ?1.  Left distal ureteral calculus ?2 mm left distal ureteral calculus status post stent placement for sepsis from a urinary source ?Based on distal location and stone size she may have passed this small  calculus ?I have recommended cystoscopy with stent removal under sedation and left ureteroscopy with stone basketing if the calculus is still present ?The procedure was discussed in detail including potential risks of bleeding, infection/sepsis and ureteral injury.  All questions were answered and she desires to proceed. ? ? ?Abbie Sons, MD ? ?[place] ?[address], [address] ?[place], [place] [address] ?[phone] ? ?

## 2021-10-01 ENCOUNTER — Ambulatory Visit: Payer: Medicare (Managed Care) | Admitting: Gastroenterology

## 2021-10-01 ENCOUNTER — Encounter: Payer: Self-pay | Admitting: Urology

## 2021-10-01 ENCOUNTER — Other Ambulatory Visit: Payer: Self-pay | Admitting: Urology

## 2021-10-01 DIAGNOSIS — N201 Calculus of ureter: Secondary | ICD-10-CM

## 2021-10-01 NOTE — Progress Notes (Signed)
Surgical Physician Order Form [facility] [place] ? ?* Scheduling expectation : Next Available ? ?*Length of Case: 30 min ? ?*Clearance needed: yes ? ?*Anticoagulation Instructions: Hold all anticoagulants ? ?*Aspirin Instructions: N/A ? ?*Post-op visit Date/Instructions:  1 month follow up ? ?*Diagnosis: Left Ureteral Stone ? ?*Procedure: Left Ureteroscopy w/basket stone removal (06269); stent removal versus exchange ? ? ?Additional orders: N/A ? ?-Admit type: OUTpatient ? ?-Anesthesia: Choice ? ?-VTE Prophylaxis Standing Order SCD?s    ?   ?Other:  ? ?-Standing Lab Orders Per Anesthesia   ? ?Lab other: UA&Urine Culture ? ?-Standing Test orders EKG/Chest x-ray per Anesthesia      ? ?Test other:  ? ?- Medications:  Gentamicin per pharmacy ? ?-Other orders:  N/A ? ? ? ?  ? ?

## 2021-10-04 ENCOUNTER — Encounter: Payer: Self-pay | Admitting: Cardiovascular Disease

## 2021-10-04 ENCOUNTER — Ambulatory Visit (INDEPENDENT_AMBULATORY_CARE_PROVIDER_SITE_OTHER): Payer: Medicare (Managed Care) | Admitting: Cardiovascular Disease

## 2021-10-04 VITALS — BP 100/60 | HR 88 | Ht 59.0 in | Wt 178.0 lb

## 2021-10-04 DIAGNOSIS — I5032 Chronic diastolic (congestive) heart failure: Secondary | ICD-10-CM

## 2021-10-04 DIAGNOSIS — Z86718 Personal history of other venous thrombosis and embolism: Secondary | ICD-10-CM | POA: Diagnosis not present

## 2021-10-04 DIAGNOSIS — Z86711 Personal history of pulmonary embolism: Secondary | ICD-10-CM

## 2021-10-04 DIAGNOSIS — A419 Sepsis, unspecified organism: Secondary | ICD-10-CM

## 2021-10-04 NOTE — Patient Instructions (Addendum)
Referral to hematology:  ?chronic anemia, on xarelto, hx of DVT and pulmonary embolism ? ? ?Medication Instructions:  ?No changes ? ?Stop  taking your Xarelto on April 8th (3 days prior to your surgery) ?Only restart once surgeon allows ? ?If you need a refill on your cardiac medications before your next appointment, please call your pharmacy.  ? ?Lab work: ?No new labs needed ? ?Testing/Procedures: ?No new testing needed ? ?Follow-Up: ?At [facility], you and your health needs are our priority.  As part of our continuing mission to provide you with exceptional heart care, we have created designated Provider Care Teams.  These Care Teams include your primary Cardiologist (physician) and Advanced Practice Providers (APPs -  Physician Assistants and Nurse Practitioners) who all work together to provide you with the care you need, when you need it. ? ?You will need a follow up appointment in 12 months ? ?Providers on your designated Care Team:   ?Murray Hodgkins, NP ?Christell Faith, PA-C ?Cadence Kathlen Mody, PA-C ? ?COVID-19 Vaccine Information can be found at: [email or web address] For questions related to vaccine distribution or appointments, please email [email or web address] or call [phone].  ? ?

## 2021-10-04 NOTE — Progress Notes (Signed)
Cardiology Office Note ? ?Date:  10/05/2021  ? ?ID:  Courtney Mack, DOB 11-Aug-1955, MRN [id number] ? ?PCP:  [facility]  ? ?Chief Complaint  ?Patient presents with  ? Pre op clearance   ?  CYSTOSCOPY WITH URETEROSCOPY, STONE BASKETRY AND STENT EXCHANGE OR REMOVAL. ?"Doing well." Medications reviewed by the patient verbally.  ? ?  ? ? ?HPI:  ?[name] is a 66 year old woman with past medical history of ?Morbid obesity ?Smoking ?DVT PE February 2022 ?Uterine cancer treated with radiation, brachytherapy ? s/p Suction thrombectomy of B pulmonary arteries, IVC filter placement ?Recurrent bilateral lower extremity DVT and PE June 2022, with thrombectomy ?GI bleed December 2022, hemoglobin to 3.6, nonbleeding ulcer identified, Xarelto restarted ?Recurrent admission for melena mid January 2023, hemoglobin 4.8, ?Hospitalized February 2023 hypoglycemia, hemoglobin 4.7, tachycardic ?COVID March 2023 ?Hospitalized March 2023 urinary tract infection/sepsis, left ureteral stent placed, Proteus and Enterobacter blood cultures, diuresis 21 L ?Who presents by referral from hospitalist service for CHF symptoms ? ?On her visit today, she reports that she is scheduled for urologic procedure, ?Cystoscopy, Stent removal ?October 16, 2021 ? ?Presents in a wheelchair, not very active at baseline ?Long history lymphedema, does not use lymphedema compression pumps ?Difficulty ambulating, very deconditioned ? ?Lab work reviewed, HGB 7.7 ?Daughter who presents with her today reports she does not feel anybody is watching her blood count on a regular basis ? ?Echo 09/12/21, reviewed ?1. Left ventricular ejection fraction, by estimation, is 55 to 60%. The  ?left ventricle has normal function. The left ventricle has no regional  ?wall motion abnormalities. There is mild asymmetric left ventricular  ?hypertrophy of the basal-septal segment.  ?Left ventricular diastolic parameters were normal.  ? 2. Right  ventricular systolic function is normal. The right ventricular  ?size is normal.  ? ?CT scan 09/11/21, reviewed ?1. 2 mm distal left ureteral calculus with mild to moderate severity ?left-sided hydronephrosis and hydroureter. ?2. Mild left upper lobe and left basilar atelectatic changes. ?3. Stable marked severity anasarca ? ?EKG personally reviewed by myself on todays visit ?Normal sinus rhythm rate 88 bpm no significant ST-T wave changes ? ? ?PMH:   has a past medical history of Diabetes mellitus without complication ([place]), DVT (deep venous thrombosis) ([place]) (09/06/2020), High cholesterol, blood clots, Hypertension, and IDA (iron deficiency anemia) (09/21/2020). ? ?PSH:    ?Past Surgical History:  ?Procedure Laterality Date  ? COLONOSCOPY N/A 06/21/2021  ? Procedure: COLONOSCOPY;  Surgeon: Toledo, Benay Pike, MD;  Location: [facility] ENDOSCOPY;  Service: Gastroenterology;  Laterality: N/A;  ? CYSTOSCOPY W/ URETERAL STENT PLACEMENT Left 09/11/2021  ? Procedure: CYSTOSCOPY WITH RETROGRADE PYELOGRAM/URETERAL STENT PLACEMENT;  Surgeon: Abbie Sons, MD;  Location: [facility] ORS;  Service: Urology;  Laterality: Left;  ? ENTEROSCOPY N/A 08/17/2021  ? Procedure: ENTEROSCOPY;  Surgeon: Lin Landsman, MD;  Location: [facility] ENDOSCOPY;  Service: Gastroenterology;  Laterality: N/A;  ? ESOPHAGOGASTRODUODENOSCOPY N/A 06/21/2021  ? Procedure: ESOPHAGOGASTRODUODENOSCOPY (EGD);  Surgeon: Toledo, Benay Pike, MD;  Location: [facility] ENDOSCOPY;  Service: Gastroenterology;  Laterality: N/A;  ? ESOPHAGOGASTRODUODENOSCOPY (EGD) WITH PROPOFOL N/A 08/17/2021  ? Procedure: ESOPHAGOGASTRODUODENOSCOPY (EGD) WITH PROPOFOL;  Surgeon: Lin Landsman, MD;  Location: [facility] ENDOSCOPY;  Service: Gastroenterology;  Laterality: N/A;  ? GIVENS CAPSULE STUDY N/A 06/22/2021  ? Procedure: GIVENS CAPSULE STUDY;  Surgeon: Toledo, Benay Pike, MD;  Location: [facility] ENDOSCOPY;  Service: Gastroenterology;  Laterality: N/A;  ? IVC FILTER INSERTION N/A 08/18/2020  ? Procedure:  IVC FILTER INSERTION;  Surgeon: Katha Cabal, MD;  Location: [place] CV LAB;  Service: Cardiovascular;  Laterality: N/A;  ? PERIPHERAL VASCULAR THROMBECTOMY Bilateral 01/03/2021  ? Procedure: PERIPHERAL VASCULAR THROMBECTOMY;  Surgeon: Algernon Huxley, MD;  Location: [place] [facility];  Service: Cardiovascular;  Laterality: Bilateral;  ? PULMONARY THROMBECTOMY N/A 08/18/2020  ? Procedure: PULMONARY THROMBECTOMY;  Surgeon: Katha Cabal, MD;  Location: [place] [facility];  Service: Cardiovascular;  Laterality: N/A;  ? TUBAL LIGATION    ? VISCERAL ANGIOGRAPHY N/A 07/18/2021  ? Procedure: VISCERAL ANGIOGRAPHY;  Surgeon: Algernon Huxley, MD;  Location: [place] [facility];  Service: Cardiovascular;  Laterality: N/A;  ? ? ?Current Outpatient Medications  ?Medication Sig Dispense Refill  ? DULERA 100-5 MCG/ACT AERO Inhale 2 puffs into the lungs 2 (two) times daily.    ? Ferrous Sulfate (IRON) 325 (65 Fe) MG TABS Take 325 mg by mouth every other day.    ? furosemide (LASIX) 20 MG tablet Take 1 tablet (20 mg total) by mouth daily. 30 tablet 11  ? loperamide (IMODIUM) 2 MG capsule Take 1 capsule (2 mg total) by mouth as needed for diarrhea or loose stools. 30 capsule 0  ? Multiple Vitamin (MULTIVITAMIN WITH MINERALS) TABS tablet Take 1 tablet by mouth daily.    ? pantoprazole (PROTONIX) 40 MG tablet Take 1 tablet (40 mg total) by mouth daily. 30 tablet 0  ? rivaroxaban (XARELTO) 20 MG TABS tablet Take 1 tablet (20 mg total) by mouth daily with supper. 30 tablet 1  ? rosuvastatin (CRESTOR) 10 MG tablet Take 1 tablet (10 mg total) by mouth daily. 15 tablet 0  ? simethicone (MYLICON) 80 MG chewable tablet Chew 1 tablet (80 mg total) by mouth 4 (four) times daily. 30 tablet 0  ? vitamin B-12 1000 MCG tablet Take 1 tablet (1,000 mcg total) by mouth daily. 30 tablet 0  ? ?No current facility-administered medications for this visit.  ? ? ?Allergies:   Patient has no known allergies.  ? ?Social History:  The patient   reports that she quit smoking about 41 years ago. Her smoking use included cigarettes. She has a 0.13 pack-year smoking history. She has never used smokeless tobacco. She reports that she does not currently use alcohol. She reports that she does not currently use drugs.  ? ?Family History:   family history includes Breast cancer in her cousin; Congestive Heart Failure in her father; Diabetes in her father and mother; High Cholesterol in her father; Hypertension in her father and mother.  ? ? ?Review of Systems: ?Review of Systems  ?Constitutional: Negative.   ?HENT: Negative.    ?Respiratory:  Positive for shortness of breath.   ?Cardiovascular:  Positive for leg swelling.  ?Gastrointestinal: Negative.   ?Musculoskeletal: Negative.   ?Neurological: Negative.   ?Psychiatric/Behavioral: Negative.    ?All other systems reviewed and are negative. ? ? ?PHYSICAL EXAM: ?VS:  BP 100/60 (BP Location: Left Arm, Patient Position: Sitting, Cuff Size: Normal)   Pulse 88   Ht '4\' 11"'$  (1.499 m)   Wt 80.7 kg   SpO2 95%   BMI 35.95 kg/m?  , BMI Body mass index is 35.95 kg/m?. ?GEN: Well nourished, well developed, in no acute distress, obese ?HEENT: normal ?Neck: no JVD, carotid bruits, or masses ?Cardiac: RRR; no murmurs, rubs, or gallops,no edema  ?Respiratory:  clear to auscultation bilaterally, normal work of breathing ?GI: soft, nontender, nondistended, + BS ?MS: no deformity or atrophy ?Skin: warm and dry, no  rash ?Neuro:  Strength and sensation are intact ?Psych: euthymic mood, full affect ? ? ?Recent Labs: ?09/11/2021: ALT 11 ?09/14/2021: Magnesium 2.1 ?09/16/2021: B Natriuretic Peptide 566.5 ?09/18/2021: BUN 24; Creatinine, Ser 0.65; Hemoglobin 7.7; Platelets 207; Potassium 3.8; Sodium 137  ? ? ?Lipid Panel ?Lab Results  ?Component Value Date  ? CHOL 215 (H) 08/20/2020  ? HDL 27 (L) 08/20/2020  ? LDLCALC 157 (H) 08/20/2020  ? TRIG 154 (H) 08/20/2020  ? ? ? ?Wt Readings from Last 3 Encounters:  ?10/04/21 80.7 kg  ?09/27/21  99.8 kg  ?09/18/21 91.9 kg  ?  ? ? ?ASSESSMENT AND PLAN: ? ?Problem List Items Addressed This Visit   ? ? Sepsis ([place])  ? Chronic diastolic CHF (congestive heart failure) ([place]) - Primary  ? Relevant Orders  ? EKG 12-

## 2021-10-05 ENCOUNTER — Telehealth: Payer: Self-pay

## 2021-10-05 NOTE — Telephone Encounter (Signed)
I spoke with Courtney Mack. We have discussed possible surgery dates and Tuesday Apri\l 11th, 2023 was agreed upon by all parties. Patient given information about surgery date, what to expect pre-operatively and post operatively.  ? ?We discussed that a Pre-Admission Testing office will be calling to set up the pre-op visit that will take place prior to surgery, and that these appointments are typically done over the phone with a Pre-Admissions RN. ? ? Informed patient that our office will communicate any additional care to be provided after surgery. Patients questions or concerns were discussed during our call. Advised to call our office should there be any additional information, questions or concerns that arise. Patient verbalized understanding.  ? ?

## 2021-10-05 NOTE — Progress Notes (Signed)
[facility] Posting Form  ? ?Surgery Date/Time: Date: 10/16/2021 ? ?Surgeon: Dr. John Giovanni, MD ? ?Surgery Location: Day Surgery ? ?Inpt ( No  )   Outpt (Yes)   Obs ( No  )  ? ?Diagnosis: N20.1 Left Ureteral Stone ? ?-CPT: [id number], [id number] ? ?Surgery: Left Ureteroscopy with basket stone removal. Ureteral Stent removal or exchange ? ?Stop Anticoagulations: Yes ? ?Cardiac/Medical/Pulmonary Clearance needed: Yes ? ?Clearance needed from Dr: Rockey Situ ? ?Clearance request sent on: Date: 10/04/2021 started by Honor Loh NP with Peri-Operative Services ? ? ?*Orders entered into EPIC  Date: 10/05/21  ? ?*Case booked in [place]  Date: 10/04/2021 ? ?*Notified pt of Surgery: Date: 10/04/2021 ? ?PRE-OP UA & CX: Yes, will obtain in clinic on 10/08/2021 ? ?*Placed into Prior Authorization Work Fabio Bering Date: 10/05/21 ? ? ?Assistant/laser/rep:No ? ? ? ? ? ? ? ? ? ? ? ? ? ? ? ?

## 2021-10-08 ENCOUNTER — Other Ambulatory Visit: Payer: Medicare (Managed Care)

## 2021-10-08 DIAGNOSIS — N201 Calculus of ureter: Secondary | ICD-10-CM

## 2021-10-09 LAB — URINALYSIS, COMPLETE
Bilirubin, UA: NEGATIVE
Glucose, UA: NEGATIVE
Nitrite, UA: NEGATIVE
Specific Gravity, UA: 1.02 (ref 1.005–1.030)
Urobilinogen, Ur: 0.2 mg/dL (ref 0.2–1.0)
pH, UA: 6 (ref 5.0–7.5)

## 2021-10-09 LAB — MICROSCOPIC EXAMINATION
RBC, Urine: >30 /hpf — ABNORMAL HIGH (ref 0–2)
WBC, UA: >30 /hpf — ABNORMAL HIGH (ref 0–5)

## 2021-10-10 ENCOUNTER — Other Ambulatory Visit: Payer: Self-pay

## 2021-10-10 ENCOUNTER — Encounter
Admission: RE | Admit: 2021-10-10 | Discharge: 2021-10-10 | Disposition: A | Discharge status: Home / Self Care | Payer: Medicare (Managed Care) | Source: Ambulatory Visit | Attending: Urology | Admitting: Urology

## 2021-10-10 DIAGNOSIS — Z01818 Encounter for other preprocedural examination: Secondary | ICD-10-CM

## 2021-10-10 DIAGNOSIS — D649 Anemia, unspecified: Secondary | ICD-10-CM

## 2021-10-10 HISTORY — DX: Hyperlipidemia, unspecified: E78.5

## 2021-10-10 HISTORY — DX: Other pulmonary embolism without acute cor pulmonale: I26.99

## 2021-10-10 HISTORY — DX: Acute respiratory failure, unspecified whether with hypoxia or hypercapnia: J96.00

## 2021-10-10 HISTORY — DX: Thrombocytopenia, unspecified: D69.6

## 2021-10-10 HISTORY — DX: Heart failure, unspecified: I50.9

## 2021-10-10 HISTORY — DX: Obesity, unspecified: E66.9

## 2021-10-10 HISTORY — DX: Sepsis, unspecified organism: A41.9

## 2021-10-10 HISTORY — DX: Malignant (primary) neoplasm, unspecified: C80.1

## 2021-10-10 NOTE — Pre-Procedure Instructions (Signed)
Courtney Merritts, MD  ?Physician ?Cardiology ?Progress Notes     ?Signed ?Encounter Date:  10/04/2021 ?  ?Signed    ?  ?Expand All Collapse All ?   ?   ?   ?   ?   ?   ?   ?   ?   ?   ?   ?   ?   ?   ?   ?   ?   ?   ?   ?   ?   ?   ?   ?   ?   ?   ?   ?   ?   ?   ?   ?   ?   ?   ?   ?   ?   ?   ?   ?   ?   ?   ?   ?   ?   ?   ?   ?   ?   ?   ?   ?   ?   ?   ?   ?   ?   ?   ?   ?   ?   ?   ?   ?   ?   ?   ?   ?   ?   ?   ?   ?   ?   ?   ?   ?   ?   ?   ?   ?   ?   ?   ?   ?   ?   ?   ?   ?   ?   ?   ?   ?   ?   ?   ?   ?   ?   ?   ?   ?   ?   ?   ?   ?   ?   ?   ?   ?   ?   ?   ?   ?   ?   ?   ?   ?   ?   ?   ?   ?   ?   ?   ?Cardiology Office Note ?  ?Date:  10/05/2021  ?  ?ID:  Courtney Mack, DOB 10/24/1955, MRN [id number] ?  ?PCP:  [facility]        ?  ?    ?Chief Complaint  ?Patient presents with  ? Pre op clearance   ?    CYSTOSCOPY WITH URETEROSCOPY, STONE BASKETRY AND STENT EXCHANGE OR REMOVAL. ?"Doing well." Medications reviewed by the patient verbally.  ?  ?   ?  ?  ?HPI:  ?[name] is a 66 year old woman with past medical history of ?Morbid obesity ?Smoking ?DVT PE February 2022 ?Uterine cancer treated with radiation, brachytherapy ? s/p Suction thrombectomy of B pulmonary arteries, IVC filter placement ?Recurrent bilateral lower extremity DVT and PE June 2022, with thrombectomy ?GI bleed December 2022, hemoglobin to 3.6, nonbleeding ulcer identified, Xarelto restarted ?Recurrent admission for melena mid January 2023, hemoglobin 4.8, ?Hospitalized February 2023 hypoglycemia, hemoglobin 4.7, tachycardic ?COVID March 2023 ?Hospitalized March 2023 urinary tract infection/sepsis, left ureteral stent placed, Proteus and Enterobacter blood cultures, diuresis 21 L ?Who presents by referral from hospitalist service for CHF symptoms ?  ?On her visit today, she reports that  she is scheduled for urologic procedure, ?Cystoscopy, Stent removal ?October 16, 2021 ?  ?Presents in a  wheelchair, not very active at baseline ?Long history lymphedema, does not use lymphedema compression pumps ?Difficulty ambulating, very deconditioned ?  ?Lab work reviewed, HGB 7.7 ?Daughter who presents with her today reports she does not feel anybody is watching her blood count on a regular basis ?  ?Echo 09/12/21, reviewed ?1. Left ventricular ejection fraction, by estimation, is 55 to 60%. The  ?left ventricle has normal function. The left ventricle has no regional  ?wall motion abnormalities. There is mild asymmetric left ventricular  ?hypertrophy of the basal-septal segment.  ?Left ventricular diastolic parameters were normal.  ? 2. Right ventricular systolic function is normal. The right ventricular  ?size is normal.  ?  ?CT scan 09/11/21, reviewed ?1. 2 mm distal left ureteral calculus with mild to moderate severity ?left-sided hydronephrosis and hydroureter. ?2. Mild left upper lobe and left basilar atelectatic changes. ?3. Stable marked severity anasarca ?  ?EKG personally reviewed by myself on todays visit ?Normal sinus rhythm rate 88 bpm no significant ST-T wave changes ?  ?  ?PMH:   has a past medical history of Diabetes mellitus without complication ([place]), DVT (deep venous thrombosis) ([place]) (09/06/2020), High cholesterol, blood clots, Hypertension, and IDA (iron deficiency anemia) (09/21/2020). ?  ?PSH:    ?     ?Past Surgical History:  ?Procedure Laterality Date  ? COLONOSCOPY N/A 06/21/2021  ?  Procedure: COLONOSCOPY;  Surgeon: Toledo, Benay Pike, MD;  Location: [facility] ENDOSCOPY;  Service: Gastroenterology;  Laterality: N/A;  ? CYSTOSCOPY W/ URETERAL STENT PLACEMENT Left 09/11/2021  ?  Procedure: CYSTOSCOPY WITH RETROGRADE PYELOGRAM/URETERAL STENT PLACEMENT;  Surgeon: Abbie Sons, MD;  Location: [facility] ORS;  Service: Urology;  Laterality: Left;  ? ENTEROSCOPY N/A 08/17/2021  ?  Procedure: ENTEROSCOPY;  Surgeon: Lin Landsman, MD;  Location: [facility] ENDOSCOPY;  Service: Gastroenterology;  Laterality: N/A;   ? ESOPHAGOGASTRODUODENOSCOPY N/A 06/21/2021  ?  Procedure: ESOPHAGOGASTRODUODENOSCOPY (EGD);  Surgeon: Toledo, Benay Pike, MD;  Location: [facility] ENDOSCOPY;  Service: Gastroenterology;  Laterality: N/A;  ? ESOPHAGOGASTRODUODENOSCOPY (EGD) WITH PROPOFOL N/A 08/17/2021  ?  Procedure: ESOPHAGOGASTRODUODENOSCOPY (EGD) WITH PROPOFOL;  Surgeon: Lin Landsman, MD;  Location: [facility] ENDOSCOPY;  Service: Gastroenterology;  Laterality: N/A;  ? GIVENS CAPSULE STUDY N/A 06/22/2021  ?  Procedure: GIVENS CAPSULE STUDY;  Surgeon: Toledo, Benay Pike, MD;  Location: [facility] ENDOSCOPY;  Service: Gastroenterology;  Laterality: N/A;  ? IVC FILTER INSERTION N/A 08/18/2020  ?  Procedure: IVC FILTER INSERTION;  Surgeon: Katha Cabal, MD;  Location: [place] CV LAB;  Service: Cardiovascular;  Laterality: N/A;  ? PERIPHERAL VASCULAR THROMBECTOMY Bilateral 01/03/2021  ?  Procedure: PERIPHERAL VASCULAR THROMBECTOMY;  Surgeon: Algernon Huxley, MD;  Location: [place] CV LAB;  Service: Cardiovascular;  Laterality: Bilateral;  ? PULMONARY THROMBECTOMY N/A 08/18/2020  ?  Procedure: PULMONARY THROMBECTOMY;  Surgeon: Katha Cabal, MD;  Location: [place] CV LAB;  Service: Cardiovascular;  Laterality: N/A;  ? TUBAL LIGATION      ? VISCERAL ANGIOGRAPHY N/A 07/18/2021  ?  Procedure: VISCERAL ANGIOGRAPHY;  Surgeon: Algernon Huxley, MD;  Location: [place] CV LAB;  Service: Cardiovascular;  Laterality: N/A;  ?  ?  ?      ?Current Outpatient Medications  ?Medication Sig Dispense Refill  ? DULERA 100-5 MCG/ACT AERO Inhale 2 puffs into the lungs 2 (two) times daily.      ? Ferrous Sulfate (IRON) 325 (65  Fe) MG TABS Take 325 mg by mouth every other day.      ? furosemide (LASIX) 20 MG tablet Take 1 tablet (20 mg total) by mouth daily. 30 tablet 11  ? loperamide (IMODIUM) 2 MG capsule Take 1 capsule (2 mg total) by mouth as needed for diarrhea or loose stools. 30 capsule 0  ? Multiple Vitamin (MULTIVITAMIN WITH MINERALS) TABS tablet Take 1  tablet by mouth daily.      ? pantoprazole (PROTONIX) 40 MG tablet Take 1 tablet (40 mg total) by mouth daily. 30 tablet 0  ? rivaroxaban (XARELTO) 20 MG TABS tablet Take 1 tablet (20 mg total) by mouth daily with supper. 30 tablet 1  ? rosuvastatin (CRESTOR) 10 MG tablet Take 1 tablet (10 mg total) by mouth daily. 15 tablet 0  ? simethicone (MYLICON) 80 MG chewable tablet Chew 1 tablet (80 mg total) by mouth 4 (four) times daily. 30 tablet 0  ? vitamin B-12 1000 MCG tablet Take 1 tablet (1,000 mcg total) by mouth daily. 30 tablet 0  ?  ?No current facility-administered medications for this visit.  ?  ?  ?Allergies:   Patient has no known allergies.  ?  ?Social History:  The patient  reports that she quit smoking about 41 years ago. Her smoking use included cigarettes. She has a 0.13 pack-year smoking history. She has never used smokeless tobacco. She reports that she does not currently use alcohol. She reports that she does not currently use drugs.  ?  ?Family History:   family history includes Breast cancer in her cousin; Congestive Heart Failure in her father; Diabetes in her father and mother; High Cholesterol in her father; Hypertension in her father and mother.  ?  ?  ?Review of Systems: ?Review of Systems  ?Constitutional: Negative.   ?HENT: Negative.    ?Respiratory:  Positive for shortness of breath.   ?Cardiovascular:  Positive for leg swelling.  ?Gastrointestinal: Negative.   ?Musculoskeletal: Negative.   ?Neurological: Negative.   ?Psychiatric/Behavioral: Negative.    ?All other systems reviewed and are negative. ?  ?  ?PHYSICAL EXAM: ?VS:  BP 100/60 (BP Location: Left Arm, Patient Position: Sitting, Cuff Size: Normal)   Pulse 88   Ht '4\' 11"'$  (1.499 m)   Wt 80.7 kg   SpO2 95%   BMI 35.95 kg/m?  , BMI Body mass index is 35.95 kg/m?. ?GEN: Well nourished, well developed, in no acute distress, obese ?HEENT: normal ?Neck: no JVD, carotid bruits, or masses ?Cardiac: RRR; no murmurs, rubs, or gallops,no  edema  ?Respiratory:  clear to auscultation bilaterally, normal work of breathing ?GI: soft, nontender, nondistended, + BS ?MS: no deformity or atrophy ?Skin: warm and dry, no rash ?Neuro:  Strength and sensa

## 2021-10-10 NOTE — Patient Instructions (Addendum)
Your procedure is scheduled on:10-16-21 Tuesday ?Report to the Registration Desk on the 1st floor of the [facility].Then proceed to the 2nd floor Surgery Desk in the [facility] ?To find out your arrival time, please call [phone] between 1PM - 3PM on:10-15-21 Monday ? ?REMEMBER: ?Instructions that are not followed completely may result in serious medical risk, up to and including death; or upon the discretion of your surgeon and anesthesiologist your surgery may need to be rescheduled. ? ?Do not eat food OR any liquids after midnight the night before surgery.  ?No gum chewing, lozengers or hard candies. ? ?TAKE THESE MEDICATIONS THE MORNING OF SURGERY WITH A SIP OF WATER: ?-rosuvastatin (CRESTOR) ?-pantoprazole (PROTONIX)-take one the night before and one on the morning of surgery - helps to prevent nausea after surgery.) ? ?Use your DULERA Inhaler the day of surgery ? ?Stop your rivaroxaban (XARELTO) 3 days prior to surgery as instructed by Dr Rockey Situ. Last dose will be on 10-12-21 Friday ? ?One week prior to surgery: ?Stop Anti-inflammatories (NSAIDS) such as Advil, Aleve, Ibuprofen, Motrin, Naproxen, Naprosyn and Aspirin based products such as Excedrin, Goodys Powder, BC Powder.You may however, take Tylenol if needed for pain up until the day of surgery. ? ?Stop ANY OVER THE COUNTER supplements/vitamins NOW (10-10-21) until after surgery (VITAMIN C and MULTIVITAMIN)-Continue your Ferrous Sulfate (Iron) pill up until the day prior to surgery ? ?No Alcohol for 24 hours before or after surgery. ? ?No Smoking including e-cigarettes for 24 hours prior to surgery.  ?No chewable tobacco products for at least 6 hours prior to surgery.  ?No nicotine patches on the day of surgery. ? ?Do not use any "recreational" drugs for at least a week prior to your surgery.  ?Please be advised that the combination of cocaine and anesthesia may have negative outcomes, up to and including death. ?If you test positive for cocaine, your  surgery will be cancelled. ? ?On the morning of surgery brush your teeth with toothpaste and water, you may rinse your mouth with mouthwash if you wish. ?Do not swallow any toothpaste or mouthwash. ? ?Do not wear jewelry, make-up, hairpins, clips or nail polish. ? ?Do not wear lotions, powders, or perfumes.  ? ?Do not shave body from the neck down 48 hours prior to surgery just in case you cut yourself which could leave a site for infection.  ?Also, freshly shaved skin may become irritated if using the CHG soap. ? ?Contact lenses, hearing aids and dentures may not be worn into surgery. ? ?Do not bring valuables to the hospital. [facility] is not responsible for any missing/lost belongings or valuables. ? ?Notify your doctor if there is any change in your medical condition (cold, fever, infection). ? ?Wear comfortable clothing (specific to your surgery type) to the hospital. ? ?After surgery, you can help prevent lung complications by doing breathing exercises.  ?Take deep breaths and cough every 1-2 hours. Your doctor may order a device called an Incentive Spirometer to help you take deep breaths. ?When coughing or sneezing, hold a pillow firmly against your incision with both hands. This is called ?splinting.? Doing this helps protect your incision. It also decreases belly discomfort. ? ?If you are being admitted to the hospital overnight, leave your suitcase in the car. ?After surgery it may be brought to your room. ? ?If you are being discharged the day of surgery, you will not be allowed to drive home. ?You will need a responsible adult (18 years or older)  to drive you home and stay with you that night.  ? ?If you are taking public transportation, you will need to have a responsible adult (18 years or older) with you. ?Please confirm with your physician that it is acceptable to use public transportation.  ? ?Please call the [facility]. at [phone] if you have any questions about these  instructions. ? ?Surgery Visitation Policy: ? ?Patients undergoing a surgery or procedure may have two family members or support persons with them as long as the person is not COVID-19 positive or experiencing its symptoms.  ? ? ?

## 2021-10-11 LAB — CULTURE, URINE COMPREHENSIVE

## 2021-10-12 ENCOUNTER — Inpatient Hospital Stay
Admission: EM | Admit: 2021-10-12 | Discharge: 2021-10-14 | DRG: 299 | Disposition: A | Discharge status: Home / Self Care | Payer: Medicare (Managed Care) | Attending: Internal Medicine | Admitting: Internal Medicine

## 2021-10-12 ENCOUNTER — Encounter
Admission: RE | Admit: 2021-10-12 | Discharge: 2021-10-12 | Disposition: A | Discharge status: Home / Self Care | Payer: Medicare (Managed Care) | Source: Ambulatory Visit | Attending: Urology | Admitting: Urology

## 2021-10-12 ENCOUNTER — Other Ambulatory Visit: Payer: Self-pay

## 2021-10-12 ENCOUNTER — Emergency Department: Payer: Medicare (Managed Care)

## 2021-10-12 DIAGNOSIS — I5032 Chronic diastolic (congestive) heart failure: Secondary | ICD-10-CM | POA: Diagnosis present

## 2021-10-12 DIAGNOSIS — Z8616 Personal history of COVID-19: Secondary | ICD-10-CM | POA: Diagnosis not present

## 2021-10-12 DIAGNOSIS — Z87440 Personal history of urinary (tract) infections: Secondary | ICD-10-CM

## 2021-10-12 DIAGNOSIS — D649 Anemia, unspecified: Secondary | ICD-10-CM | POA: Insufficient documentation

## 2021-10-12 DIAGNOSIS — I110 Hypertensive heart disease with heart failure: Secondary | ICD-10-CM | POA: Diagnosis present

## 2021-10-12 DIAGNOSIS — I10 Essential (primary) hypertension: Secondary | ICD-10-CM | POA: Diagnosis present

## 2021-10-12 DIAGNOSIS — E785 Hyperlipidemia, unspecified: Secondary | ICD-10-CM | POA: Diagnosis present

## 2021-10-12 DIAGNOSIS — K31819 Angiodysplasia of stomach and duodenum without bleeding: Secondary | ICD-10-CM

## 2021-10-12 DIAGNOSIS — Z86711 Personal history of pulmonary embolism: Secondary | ICD-10-CM | POA: Diagnosis not present

## 2021-10-12 DIAGNOSIS — E119 Type 2 diabetes mellitus without complications: Secondary | ICD-10-CM | POA: Diagnosis present

## 2021-10-12 DIAGNOSIS — Z87442 Personal history of urinary calculi: Secondary | ICD-10-CM | POA: Diagnosis not present

## 2021-10-12 DIAGNOSIS — D500 Iron deficiency anemia secondary to blood loss (chronic): Secondary | ICD-10-CM | POA: Diagnosis not present

## 2021-10-12 DIAGNOSIS — Z86010 Personal history of colonic polyps: Secondary | ICD-10-CM

## 2021-10-12 DIAGNOSIS — N200 Calculus of kidney: Secondary | ICD-10-CM | POA: Diagnosis present

## 2021-10-12 DIAGNOSIS — Z8619 Personal history of other infectious and parasitic diseases: Secondary | ICD-10-CM | POA: Diagnosis not present

## 2021-10-12 DIAGNOSIS — Q2739 Arteriovenous malformation, other site: Principal | ICD-10-CM

## 2021-10-12 DIAGNOSIS — Z87891 Personal history of nicotine dependence: Secondary | ICD-10-CM

## 2021-10-12 DIAGNOSIS — Z7901 Long term (current) use of anticoagulants: Secondary | ICD-10-CM | POA: Diagnosis not present

## 2021-10-12 DIAGNOSIS — Z79899 Other long term (current) drug therapy: Secondary | ICD-10-CM

## 2021-10-12 DIAGNOSIS — D62 Acute posthemorrhagic anemia: Secondary | ICD-10-CM | POA: Diagnosis present

## 2021-10-12 DIAGNOSIS — I959 Hypotension, unspecified: Secondary | ICD-10-CM | POA: Diagnosis present

## 2021-10-12 DIAGNOSIS — Z86718 Personal history of other venous thrombosis and embolism: Secondary | ICD-10-CM | POA: Diagnosis not present

## 2021-10-12 DIAGNOSIS — Z01818 Encounter for other preprocedural examination: Secondary | ICD-10-CM

## 2021-10-12 DIAGNOSIS — Z8249 Family history of ischemic heart disease and other diseases of the circulatory system: Secondary | ICD-10-CM | POA: Diagnosis not present

## 2021-10-12 DIAGNOSIS — Z83438 Family history of other disorder of lipoprotein metabolism and other lipidemia: Secondary | ICD-10-CM

## 2021-10-12 DIAGNOSIS — B3781 Candidal esophagitis: Secondary | ICD-10-CM | POA: Diagnosis present

## 2021-10-12 DIAGNOSIS — E7800 Pure hypercholesterolemia, unspecified: Secondary | ICD-10-CM | POA: Diagnosis present

## 2021-10-12 DIAGNOSIS — K31811 Angiodysplasia of stomach and duodenum with bleeding: Secondary | ICD-10-CM | POA: Diagnosis present

## 2021-10-12 DIAGNOSIS — Z6835 Body mass index (BMI) 35.0-35.9, adult: Secondary | ICD-10-CM

## 2021-10-12 DIAGNOSIS — Z01812 Encounter for preprocedural laboratory examination: Secondary | ICD-10-CM | POA: Insufficient documentation

## 2021-10-12 DIAGNOSIS — E669 Obesity, unspecified: Secondary | ICD-10-CM | POA: Diagnosis present

## 2021-10-12 DIAGNOSIS — Z833 Family history of diabetes mellitus: Secondary | ICD-10-CM | POA: Diagnosis not present

## 2021-10-12 DIAGNOSIS — R195 Other fecal abnormalities: Secondary | ICD-10-CM

## 2021-10-12 HISTORY — DX: COVID-19: U07.1

## 2021-10-12 LAB — COMPREHENSIVE METABOLIC PANEL
ALT: 12 U/L (ref 0–44)
AST: 27 U/L (ref 15–41)
Albumin: 2.9 g/dL — ABNORMAL LOW (ref 3.5–5.0)
Alkaline Phosphatase: 44 U/L (ref 38–126)
Anion gap: 11 (ref 5–15)
BUN: 21 mg/dL (ref 8–23)
CO2: 24 mmol/L (ref 22–32)
Calcium: 8.8 mg/dL — ABNORMAL LOW (ref 8.9–10.3)
Chloride: 100 mmol/L (ref 98–111)
Creatinine, Ser: 0.85 mg/dL (ref 0.44–1.00)
GFR, Estimated: >60 mL/min (ref >60)
Glucose, Bld: 134 mg/dL — ABNORMAL HIGH (ref 70–99)
Potassium: 3.3 mmol/L — ABNORMAL LOW (ref 3.5–5.1)
Sodium: 135 mmol/L (ref 135–145)
Total Bilirubin: 0.7 mg/dL (ref 0.3–1.2)
Total Protein: 7.7 g/dL (ref 6.5–8.1)

## 2021-10-12 LAB — CBC WITH DIFFERENTIAL/PLATELET
Abs Immature Granulocytes: 0.02 10*3/uL (ref 0.00–0.07)
Basophils Absolute: 0 10*3/uL (ref 0.0–0.1)
Basophils Relative: 0 %
Eosinophils Absolute: 0.2 10*3/uL (ref 0.0–0.5)
Eosinophils Relative: 3 %
HCT: 22.7 % — ABNORMAL LOW (ref 36.0–46.0)
Hemoglobin: 6.6 g/dL — ABNORMAL LOW (ref 12.0–15.0)
Immature Granulocytes: 0 %
Lymphocytes Relative: 10 %
Lymphs Abs: 0.7 10*3/uL (ref 0.7–4.0)
MCH: 23.6 pg — ABNORMAL LOW (ref 26.0–34.0)
MCHC: 29.1 g/dL — ABNORMAL LOW (ref 30.0–36.0)
MCV: 81.1 fL (ref 80.0–100.0)
Monocytes Absolute: 0.4 10*3/uL (ref 0.1–1.0)
Monocytes Relative: 6 %
Neutro Abs: 5.7 10*3/uL (ref 1.7–7.7)
Neutrophils Relative %: 81 %
Platelets: 349 10*3/uL (ref 150–400)
RBC: 2.8 MIL/uL — ABNORMAL LOW (ref 3.87–5.11)
RDW: 17.5 % — ABNORMAL HIGH (ref 11.5–15.5)
WBC: 7 10*3/uL (ref 4.0–10.5)
nRBC: 0 % (ref 0.0–0.2)

## 2021-10-12 LAB — CBC
HCT: 21.5 % — ABNORMAL LOW (ref 36.0–46.0)
Hemoglobin: 6.4 g/dL — ABNORMAL LOW (ref 12.0–15.0)
MCH: 23.8 pg — ABNORMAL LOW (ref 26.0–34.0)
MCHC: 29.8 g/dL — ABNORMAL LOW (ref 30.0–36.0)
MCV: 79.9 fL — ABNORMAL LOW (ref 80.0–100.0)
Platelets: 338 10*3/uL (ref 150–400)
RBC: 2.69 MIL/uL — ABNORMAL LOW (ref 3.87–5.11)
RDW: 17.4 % — ABNORMAL HIGH (ref 11.5–15.5)
WBC: 6.2 10*3/uL (ref 4.0–10.5)
nRBC: 0 % (ref 0.0–0.2)

## 2021-10-12 LAB — PROTIME-INR
INR: 1.3 — ABNORMAL HIGH (ref 0.8–1.2)
Prothrombin Time: 16.1 seconds — ABNORMAL HIGH (ref 11.4–15.2)

## 2021-10-12 LAB — PREPARE RBC (CROSSMATCH)

## 2021-10-12 MED ORDER — ASCORBIC ACID 500 MG PO TABS
1000.0000 mg | ORAL_TABLET | Freq: Every day | ORAL | Status: DC
  Administered 2021-10-12 – 2021-10-13 (×2): 1000 mg via ORAL
  Filled 2021-10-12 (×2): qty 2

## 2021-10-12 MED ORDER — FUROSEMIDE 20 MG PO TABS
20.0000 mg | ORAL_TABLET | ORAL | Status: DC
  Administered 2021-10-13 – 2021-10-14 (×2): 20 mg via ORAL
  Filled 2021-10-12 (×4): qty 1

## 2021-10-12 MED ORDER — LOPERAMIDE HCL 2 MG PO CAPS: 2.0000 mg | ORAL_CAPSULE | ORAL | Status: DC | PRN

## 2021-10-12 MED ORDER — ADULT MULTIVITAMIN W/MINERALS CH
1.0000 | ORAL_TABLET | Freq: Every day | ORAL | Status: DC
  Administered 2021-10-12 – 2021-10-13 (×2): 1 via ORAL
  Filled 2021-10-12 (×2): qty 1

## 2021-10-12 MED ORDER — MOMETASONE FURO-FORMOTEROL FUM 100-5 MCG/ACT IN AERO
2.0000 | INHALATION_SPRAY | Freq: Two times a day (BID) | RESPIRATORY_TRACT | Status: DC
  Administered 2021-10-13 – 2021-10-14 (×3): 2 via RESPIRATORY_TRACT
  Filled 2021-10-12 (×2): qty 8.8

## 2021-10-12 MED ORDER — ROSUVASTATIN CALCIUM 10 MG PO TABS
10.0000 mg | ORAL_TABLET | ORAL | Status: DC
  Administered 2021-10-13 – 2021-10-14 (×2): 10 mg via ORAL
  Filled 2021-10-12 (×4): qty 1

## 2021-10-12 MED ORDER — SODIUM CHLORIDE 0.9 % IV SOLN
10.0000 mL/h | Freq: Once | INTRAVENOUS | Status: AC
  Administered 2021-10-12: 10 mL/h via INTRAVENOUS

## 2021-10-12 MED ORDER — PANTOPRAZOLE SODIUM 40 MG IV SOLR
40.0000 mg | Freq: Once | INTRAVENOUS | Status: AC
  Administered 2021-10-12: 40 mg via INTRAVENOUS
  Filled 2021-10-12: qty 10

## 2021-10-12 MED ORDER — PANTOPRAZOLE INFUSION (NEW) - SIMPLE MED
8.0000 mg/h | INTRAVENOUS | Status: DC
  Administered 2021-10-12 – 2021-10-14 (×4): 8 mg/h via INTRAVENOUS
  Filled 2021-10-12 (×4): qty 100

## 2021-10-12 NOTE — Assessment & Plan Note (Signed)
Continue statin. 

## 2021-10-12 NOTE — ED Provider Notes (Signed)
? ?[facility] ?Provider Note ? ? ? Event Date/Time  ? First MD Initiated Contact with Patient 10/12/21 1549   ?  (approximate) ? ? ?History  ? ?Abnormal Lab ? ? ?HPI ? ?Courtney Mack is a 66 y.o. female extensive history of CHF kidney stones and recent admission to the hospital for septic shock as well as history of PEs on Xarelto and history of GI bleeds requiring multiple transfusions in the past presents to the ER from preoperative outpatient testing for low hemoglobin.  Reportedly hemoglobin 6.4.  She denies any melena or hematochezia no hematuria.  States she has been taking her iron supplement.  Does feel weak has headache and has fatigue and exertional dyspnea. ?  ? ? ?Physical Exam  ? ?Triage Vital Signs: ?ED Triage Vitals  ?Enc Vitals Group  ?   BP 10/12/21 1527 (!) 115/55  ?   Pulse Rate 10/12/21 1527 97  ?   Resp 10/12/21 1527 18  ?   Temp 10/12/21 1527 98.5 ?F (36.9 ?C)  ?   Temp Source 10/12/21 1527 Oral  ?   SpO2 10/12/21 1527 97 %  ?   Weight 10/12/21 1524 178 lb (80.7 kg)  ?   Height 10/12/21 1524 '4\' 11"'$  (1.499 m)  ?   Head Circumference --   ?   Peak Flow --   ?   Pain Score 10/12/21 1524 0  ?   Pain Loc --   ?   Pain Edu? --   ?   Excl. in [place]? --   ? ? ?Most recent vital signs: ?Vitals:  ? 10/12/21 1527 10/12/21 1700  ?BP: (!) 115/55 127/82  ?Pulse: 97 88  ?Resp: 18 17  ?Temp: 98.5 ?F (36.9 ?C)   ?SpO2: 97% 100%  ? ? ? ?Constitutional: Alert  pale appearing ?Eyes: Conjunctivae are normal.  ?Head: Atraumatic. ?Nose: No congestion/rhinnorhea. ?Mouth/Throat: Mucous membranes are moist.   ?Neck: Painless ROM.  ?Cardiovascular:   Good peripheral circulation. No m/g/r ?Respiratory: Normal respiratory effort.  No retractions. Scattered wheeze throughout ?Gastrointestinal: Soft and nontender.  Stool is brown, guaiac positive ?Musculoskeletal:  no deformity.  1+ BLE edema ?Neurologic:  MAE spontaneously. No gross focal neurologic deficits are appreciated.  ?Skin:  Skin is warm,  dry and intact. No rash noted. ?Psychiatric: Mood and affect are normal. Speech and behavior are normal. ? ? ? ?ED Results / Procedures / Treatments  ? ?Labs ?(all labs ordered are listed, but only abnormal results are displayed) ?Labs Reviewed  ?COMPREHENSIVE METABOLIC PANEL - Abnormal; Notable for the following components:  ?    Result Value  ? Potassium 3.3 (*)   ? Glucose, Bld 134 (*)   ? Calcium 8.8 (*)   ? Albumin 2.9 (*)   ? All other components within normal limits  ?CBC WITH DIFFERENTIAL/PLATELET - Abnormal; Notable for the following components:  ? RBC 2.80 (*)   ? Hemoglobin 6.6 (*)   ? HCT 22.7 (*)   ? MCH 23.6 (*)   ? MCHC 29.1 (*)   ? RDW 17.5 (*)   ? All other components within normal limits  ?PROTIME-INR - Abnormal; Notable for the following components:  ? Prothrombin Time 16.1 (*)   ? INR 1.3 (*)   ? All other components within normal limits  ?TYPE AND SCREEN  ?PREPARE RBC (CROSSMATCH)  ?TYPE AND SCREEN  ? ? ? ?EKG ? ?ED ECG REPORT ?Tyrone Nine, the attending physician, personally viewed and  interpreted this ECG. ? ? Date: 10/12/2021 ? EKG Time: 15:36 ? Rate: 95 ? Rhythm: sinus ? Axis: normal ? Intervals: normal ? ST&T Change: no stemi, no depressions ? ? ? ?RADIOLOGY ?Please see ED Course for my review and interpretation. ? ?I personally reviewed all radiographic images ordered to evaluate for the above acute complaints and reviewed radiology reports and findings.  These findings were personally discussed with the patient.  Please see medical record for radiology report. ? ? ? ?PROCEDURES: ? ?Critical Care performed: Yes, see critical care procedure note(s) ? ?.Critical Care ?Performed by: Merlyn Lot, MD ?Authorized by: Merlyn Lot, MD  ? ?Critical care provider statement:  ?  Critical care time (minutes):  35 ?  Critical care was necessary to treat or prevent imminent or life-threatening deterioration of the following conditions:  Circulatory failure ?  Critical care was time  spent personally by me on the following activities:  Ordering and performing treatments and interventions, ordering and review of laboratory studies, ordering and review of radiographic studies, pulse oximetry, re-evaluation of patient's condition, review of old charts, obtaining history from patient or surrogate, examination of patient, evaluation of patient's response to treatment, discussions with primary provider, discussions with consultants and development of treatment plan with patient or surrogate ? ? ?MEDICATIONS ORDERED IN ED: ?Medications  ?0.9 %  sodium chloride infusion (has no administration in time range)  ?pantoprazole (PROTONIX) injection 40 mg (has no administration in time range)  ? ? ? ?IMPRESSION / MDM / ASSESSMENT AND PLAN / ED COURSE  ?I reviewed the triage vital signs and the nursing notes. ?             ?               ? ?Differential diagnosis includes, but is not limited to, anemia of chronic disease, iron deficiency anemia, acute blood loss anemia, upper GI bleed, electrolyte abnormality, aplastic crisis ? ?Presented to ER with symptoms consistent with symptomatic anemia.  Blood work sent for the above differential does show evidence of.  Presentation is complicated by fact that she is on Xarelto for history of PE DVT.  She been compliant with this.  Denies any hematuria.  Does appear frail pale and weak.  Feels like her swelling is improved therefore doubt CHF exacerbation.  She does have acute anemia will order blood transfusion.  Not reporting any melena but her stool is guaiac positive and patient has history of multiple GI bleeds in the past.  Has a history of reflux.  Will order dose of Protonix.  I do not believe that she is actively hemorrhaging right now likely subacute bleeding in the setting of her Xarelto but given her comorbidities presentation history of CHF with symptomatic anemia I do believe that observation the hospital clinically indicated she may need additional  diuresis after transfusion and to make sure that her hemoglobin is appropriately stabilizing. ? ? ?  ? ? ?FINAL CLINICAL IMPRESSION(S) / ED DIAGNOSES  ? ?Final diagnoses:  ?Symptomatic anemia  ?Guaiac positive stools  ? ? ? ?Rx / DC Orders  ? ?ED Discharge Orders   ? ? None  ? ?  ? ? ? ?Note:  This document was prepared using Dragon voice recognition software and may include unintentional dictation errors. ? ?  ?Merlyn Lot, MD ?10/12/21 1726 ? ?

## 2021-10-12 NOTE — Pre-Procedure Instructions (Signed)
Gerrit Heck, [place] - 07/30/2021 9:00 AM EST ?Formatting of this note is different from the original. ?Images from the original note were not included. ? ? ?PATIENT PROFILE: ?Courtney Mack is a 66 y.o. female who presents to the [facility] for consultation at the request of [facility] hospital follow-up of symptomatic anemia.  ? ?HISTORY OF PRESENT ILLNESS  ? ?[name] presents to the [facility] for chief complaint of [facility] hospital follow-up of symptomatic/profound anemia and iron-deficiency anemia. She was admitted 12/14 - 06/26/21 at [facility] for chief complaint of generalized weakness, cough, shortness of breath, and dark stool. Hemoglobin was found to be a nadir of 3.6 compared to baseline 10.7 in July 2022. Dr. Alice Reichert and myself saw patient as hospital consult. She is s/p EGD, colonoscopy, and video capsule endoscopy which were completely negative for any sources of acute or chronic bleeding. She had small subcentimeter tubular adenoma removed from cecum and esophageal candidiasis. Her anticoagulation was resumed upon discharged and hemoglobin was 8.7. She received a total of 4 units of pRBCs during this hospitalization. However, she presented back to [facility] and was admitted1/11 - 07/23/21 with hemoglobin 4.8. She had positive NM GI Bleeding Scan at splenic flexure and she is s/p endovascular embolization on 07/18/21. Her Xarelto was re-started on 1/13. She received a total of 3 units pRBCs during this hospitalization. Hemoglobin upon discharge from 8.6. She has not seen her primary care doctor since discharge from the hospital. She has been living at home with her daughter and grandchildren. Patient reports she is not doing well. She reports over the last 24 hours she has gotten short of breath and increasingly weak. Stools are dark but have been dark because she is taking iron. She reports last BM was yesterday morning and was dark in color. She says this was not tarry. No gross blood. She  denies any diarrhea or constipation. She is having a bowel movement appx every other day. She is having the same left-sided abdominal pain as she was in the hospital in December - no better and no worse. Pain is not associated with bowel movements. Her appetite has been stable. She is not avoiding meals and there is no food fear. She denies any nausea, vomiting, esophageal dysphagia, heartburn, or acid reflux. She denies any chest pain, palpitations, or syncopal episodes.  ? ?GENERAL REVIEW OF SYSTEMS: ?General: negative for - fever, chills, weight gain, weight loss, + fatigue  ?Head: no injury, migraines, headaches ?Eyes: no jaundice, itching, dryness, tearing, redness, vision changes ?Nose: no injury, bleeding ?Mouth/Throat: no oral ulcers, swollen neck, dry mouth, sore throat, hoarseness ?Endocrine: no heat/cold intolerance ?Respiratory: no cough, wheezing, + SOB ?Cardiovascular: no chest pain, palpitations ?GI: see HPI ?Musculoskeletal: no joint swelling, muscle/joint pain  ?Neurological: no seizures, syncope, dizziness, numbness/tingling ?Skin: no rashes, itching ?Hematological and Lymphatic: easy bruising, easy bleeding ? ?MEDICATIONS: ?Outpatient Encounter Medications as of 07/30/2021  ?Medication Sig Dispense Refill  ? acetaminophen (TYLENOL) 500 MG tablet Take 500 mg by mouth every 6 (six) hours as needed for Pain  ? dexAMETHasone (DECADRON) 4 MG tablet Take 4 mg by mouth 2 (two) times daily  ? dext 70/polycarbophil/peg/NaCl (ARTIFICIAL TEAR SOLUTION OPHTH) Apply to eye as needed  ? fluticasone propion-salmeteroL (ADVAIR DISKUS) 100-50 mcg/dose diskus inhaler Inhale 1 inhalation into the lungs every 12 (twelve) hours  ? losartan (COZAAR) 25 MG tablet Take 25 mg by mouth once daily  ? metFORMIN (GLUCOPHAGE) 500 MG tablet Take 500 mg by mouth  2 (two) times daily  ? MULTIVITAMIN WITH MINERALS ORAL Take 1 tablet by mouth once daily  ? norethindrone (AYGESTIN) 5 mg tablet Take 1 tablet by mouth 3 (three) times  daily  ? rivaroxaban (XARELTO) 20 mg tablet Take 20 mg by mouth daily with dinner  ? rosuvastatin (CRESTOR) 10 MG tablet Take 10 mg by mouth once daily  ? UNABLE TO FIND Take 1 Can by mouth 2 (two) times daily Med Name: Ensure Max Protein  ? bismuth subsalicylate (PEPTO BISMOL MAXIMUM STRENGTH) 525 mg/15 mL suspension Take by mouth 2 (two) times daily For diarrhea (Patient not taking: Reported on 07/30/2021)  ? ketoconazole (NIZORAL) 2 % cream Apply topically once daily (Patient not taking: Reported on 11/17/2020)  ? ondansetron (ZOFRAN) 4 MG tablet Take 4 mg by mouth every 6 (six) hours as needed (Patient not taking: Reported on 11/17/2020)  ? prochlorperazine (COMPAZINE) 10 MG tablet Take 10 mg by mouth every 6 (six) hours as needed (Patient not taking: Reported on 11/17/2020)  ? ?Facility-Administered Encounter Medications as of 07/30/2021  ?Medication Dose Route Frequency Provider Last Rate Last Admin  ? loperamide (IMODIUM A-D) tablet 2 mg 2 mg Oral TID PRN Christel Mormon Marjory Sneddon, MD  ? ?ALLERGIES: ?Patient has no known allergies. ? ?PAST MEDICAL HISTORY: ?Past Medical History:  ?Diagnosis Date  ? Acute massive pulmonary embolism (CMS-HCC) 08/25/2020  ? DM (diabetes mellitus), type 2 with complications (CMS-HCC) 05/01/8526  ? History of cancer  ?endometrial  ? Hypercholesterolemia  ? Hypertension  ? Morbidly obese (CMS-HCC) 08/25/2020  ? Post-menopause bleeding 08/25/2020  ? ?PAST SURGICAL HISTORY: ?Past Surgical History:  ?Procedure Laterality Date  ? pulmonary thrombectomy 08/18/2020  ? COLONOSCOPY 06/21/2021  ?(Inpatient) Tubular adenomas/PHx CP/No repeat needed at this time/TKT  ? EGD 06/21/2021  ?(Inpatient)Gastritis/Intestinal metaplasia/yeast with pseudohyphae/No repeat/TKT  ? btl  ? TONSILLECTOMY  ? vena caval filter  ? ? ?FAMILY HISTORY: ?Family History  ?Problem Relation Age of Onset  ? Anesthesia problems Neg Hx  ? Malignant hyperthermia Neg Hx  ? ? ?SOCIAL HISTORY: ?Social History  ? ?Socioeconomic History  ?  Marital status: Divorced  ?Occupational History  ? Occupation: retired  ?Tobacco Use  ? Smoking status: Never  ? Smokeless tobacco: Never  ?Vaping Use  ? Vaping Use: Never used  ?Substance and Sexual Activity  ? Alcohol use: Not Currently  ? Drug use: Not Currently  ? ?Vitals:  ?07/30/21 0913  ?BP: 120/65  ?BP Location: Left upper arm  ?Patient Position: Sitting  ?BP Cuff Size: Adult  ?Pulse: 93  ?Weight: (!) 104.8 kg (231 lb)  ?Height: 147.3 cm ('4\' 10"'$ )  ? ?Wt Readings from Last 3 Encounters:  ?07/30/21 (!) 104.8 kg (231 lb)  ?12/01/20 (!) 110.8 kg (244 lb 4.3 oz)  ?11/28/20 (!) 106.2 kg (234 lb 2.1 oz)  ? ?PHYSICAL EXAM: ?Pleasant, non-toxic appearing female in wheelchair. Answers all questions appropriately. Daughter present.  ?General: NAD, alert and oriented x 4 ?HEENT: PEERLA, EOMI, anciteric  ?Neck: supple, no JVD or thyromegaly. No lymphadenopathy.  ?Respiratory: CTA bilaterally, no wheezes, crackles, or other adventitious sounds, mild increased respiratory effort with no accessory muscles upon breathing ?Cardiac: RRR, no murmur, rub, or gallop  ?GI: soft, normal bowel sounds, no TTP, no HSM, no rebound or guarding ?Rectal: offered DRE, but patient adamantly declined and said she cannot roll over and not get on the exam table.  ?Msk: no edema, well perfused with 2+ pulses, ?Skin: Skin color, texture, turgor normal, no rashes or  lesions ?Lymph: no LAD ?Neuro: Grossly intact  ? ?REVIEW OF DATA: ?I have reviewed the following data today: ? ?EGD 06/21/21: ?- esophageal candidiasis  ?- gastritis with intestinal metaplasia present in one focus measuring 0.5 mm, negative for H pylori or malignancy ?- 1 cm hiatal hernia ?- normal examined duodenum  ? ?CSY 06/21/21 ?- sigmoid diverticulosis ?- one 5 mm TA removed from cecum ?- no endoscopic evidence of bleeding, erythema, inflammation, mass, or ulcerations in entire colon ?- Grade II non-bleeding internal hemorrhoids ? ?VCE 06/22/21: ?- visualization fair through  small bowel ?- no obvious lesions in esophagus or stomach ?- small bowel ulceration noted at 1256, inflammation noted at 1541 ?- no further lesion, fresh blood, or old blood noted ? ?NM GI bleeding scan 1

## 2021-10-12 NOTE — H&P (Signed)
? ? ?HISTORY AND PHYSICAL ? ?Patient: Courtney Mack 66 y.o. female ?MRN: [id number] ? ?Today is hospital day 0 after presenting to ED on 10/12/2021  3:49 PM with  ?Chief Complaint  ?Patient presents with  ? Abnormal Lab  ? ? ? ?RECORD REVIEW AND HOSPITAL COURSE: ?[name] is a 66 year old female, history of CHF, kidney stones, recent admission to the hospital for septic shock and history of pulmonary emboli on Xarelto, history of GI bleeds requiring multiple transfusions in the past.   ?Subjective: Patient was undergoing outpatient preoperative evaluation (planned cystoscopy) and labs revealed low hemoglobin 6.4.  Patient denies blood in stool, dark black stool, nausea/vomiting, she reports chronic abdominal pain which she attributes to her kidney stones, she denies pain with eating.  Reports compliance with home iron supplementation.  Reports weakness/fatigue, exertional dyspnea.  Reports headache over the past few days. ?ED course vital signs slightly hypotensive with blood pressure initially 115/55, afebrile, normal respirations/temperature/SPO2.  Hemoglobin 6.6, hematocrit 22.7, RDW 17.5.  Potassium slightly low at 3.3, low albumin at 2.9, no other significant concerns on CMP.  Hemoccult positive per ED physical exam.  Was given normal saline bolus, Protonix 40 mg IV, 2 units PRBC were ordered. ?Pertinent recent GI records:  ?Small bowel endoscopy 08/17/2021 showed gastric antral vascular ectasia without bleeding. Treated with argon plasma coagulation (APC). ?Capsule endoscopy 06/27/2021 ?EGD and Colonoscopy 06/21/2021 showed gastritis, colon polyp, small diverticula without bleed ? ?Procedures and Significant Results:  ?2 units PRBC initiated 10/12/21  ? ?Consultants:  ?Gastroenterology ? ? ? ? ? ?ASSESSMENT & PLAN ? ?Anemia due to GI blood loss, symptomatic with fatigue/lightheadedness and shortness of breath ?GI consulted, continue Protonix, continue with PRBC, GI to see and will likely proceed with  scope in a few days once off Eliquis long enough ?Continue to monitor serial CBC ?Holding anticoagulation/pharmacologic VTE PPx ? ?Diabetes mellitus without complication ([place]) ?SSI ? ?Essential hypertension ?Normotensive in ED  ?VS per protocol  ? ?Dyslipidemia ?Continue statin  ? ?Chronic diastolic CHF (congestive heart failure) ([place]) ?Not in exacerbation ? ? ? ? ?VTE Ppx: SCD ?CODE STATUS: FULL ?Admitted from: home ?Expected Dispo: home ?Barriers to discharge: Continued medical work-up/treatment ?Family communication: none at this time  ? ? ? ? ? ? ? ? ? ? ? ? ? ?Past Medical History:  ?Diagnosis Date  ? ARF (acute respiratory failure) ([place])   ? Cancer [facility])   ? endometrial  ? CHF (congestive heart failure) ([place])   ? Diabetes mellitus without complication ([place])   ? DVT (deep venous thrombosis) ([place]) 09/06/2020  ? High cholesterol   ? Hx of blood clots   ? Hyperlipidemia   ? Hypertension   ? IDA (iron deficiency anemia) 09/21/2020  ? Obesity   ? Pulmonary embolism ([place])   ? Sepsis ([place])   ? Thrombocytopenia ([place])   ? ? ?Past Surgical History:  ?Procedure Laterality Date  ? COLONOSCOPY N/A 06/21/2021  ? Procedure: COLONOSCOPY;  Surgeon: Toledo, Benay Pike, MD;  Location: [facility] ENDOSCOPY;  Service: Gastroenterology;  Laterality: N/A;  ? CYSTOSCOPY W/ URETERAL STENT PLACEMENT Left 09/11/2021  ? Procedure: CYSTOSCOPY WITH RETROGRADE PYELOGRAM/URETERAL STENT PLACEMENT;  Surgeon: Abbie Sons, MD;  Location: [facility] ORS;  Service: Urology;  Laterality: Left;  ? ENTEROSCOPY N/A 08/17/2021  ? Procedure: ENTEROSCOPY;  Surgeon: Lin Landsman, MD;  Location: [facility] ENDOSCOPY;  Service: Gastroenterology;  Laterality: N/A;  ? ESOPHAGOGASTRODUODENOSCOPY N/A 06/21/2021  ? Procedure: ESOPHAGOGASTRODUODENOSCOPY (EGD);  Surgeon: Alice Reichert, Benay Pike, MD;  Location: [facility] ENDOSCOPY;  Service: Gastroenterology;  Laterality: N/A;  ? ESOPHAGOGASTRODUODENOSCOPY (EGD) WITH PROPOFOL N/A 08/17/2021  ? Procedure: ESOPHAGOGASTRODUODENOSCOPY (EGD)  WITH PROPOFOL;  Surgeon: Lin Landsman, MD;  Location: [facility] ENDOSCOPY;  Service: Gastroenterology;  Laterality: N/A;  ? GIVENS CAPSULE STUDY N/A 06/22/2021  ? Procedure: GIVENS CAPSULE STUDY;  Surgeon: Toledo, Benay Pike, MD;  Location: [facility] ENDOSCOPY;  Service: Gastroenterology;  Laterality: N/A;  ? IVC FILTER INSERTION N/A 08/18/2020  ? Procedure: IVC FILTER INSERTION;  Surgeon: Katha Cabal, MD;  Location: [place] CV LAB;  Service: Cardiovascular;  Laterality: N/A;  ? PERIPHERAL VASCULAR THROMBECTOMY Bilateral 01/03/2021  ? Procedure: PERIPHERAL VASCULAR THROMBECTOMY;  Surgeon: Algernon Huxley, MD;  Location: [place] CV LAB;  Service: Cardiovascular;  Laterality: Bilateral;  ? PULMONARY THROMBECTOMY N/A 08/18/2020  ? Procedure: PULMONARY THROMBECTOMY;  Surgeon: Katha Cabal, MD;  Location: [place] CV LAB;  Service: Cardiovascular;  Laterality: N/A;  ? TUBAL LIGATION    ? VISCERAL ANGIOGRAPHY N/A 07/18/2021  ? Procedure: VISCERAL ANGIOGRAPHY;  Surgeon: Algernon Huxley, MD;  Location: [place] CV LAB;  Service: Cardiovascular;  Laterality: N/A;  ? ? ?Family History  ?Problem Relation Age of Onset  ? Hypertension Mother   ? Diabetes Mother   ? Diabetes Father   ? Hypertension Father   ? High Cholesterol Father   ? Congestive Heart Failure Father   ? Breast cancer Cousin   ? ?Social History:  reports that she quit smoking about 41 years ago. Her smoking use included cigarettes. She has a 0.13 pack-year smoking history. She has never used smokeless tobacco. She reports that she does not currently use alcohol. She reports that she does not currently use drugs. ? ?Allergies: No Known Allergies ? ?No current facility-administered medications on file prior to encounter.  ? ?Current Outpatient Medications on File Prior to Encounter  ?Medication Sig Dispense Refill  ? Ascorbic Acid (VITAMIN C) 1000 MG tablet Take 1,000 mg by mouth daily.    ? Ferrous Sulfate (IRON) 325 (65 Fe) MG TABS Take 325  mg by mouth every other day.    ? furosemide (LASIX) 20 MG tablet Take 1 tablet (20 mg total) by mouth daily. (Patient taking differently: Take 20 mg by mouth every morning.) 30 tablet 11  ? loperamide (IMODIUM) 2 MG capsule Take 1 capsule (2 mg total) by mouth as needed for diarrhea or loose stools. 30 capsule 0  ? mometasone-formoterol (DULERA) 100-5 MCG/ACT AERO Inhale 2 puffs into the lungs 2 (two) times daily.    ? Multiple Vitamin (MULTIVITAMIN WITH MINERALS) TABS tablet Take 1 tablet by mouth daily.    ? pantoprazole (PROTONIX) 40 MG tablet Take 40 mg by mouth every morning.    ? rivaroxaban (XARELTO) 20 MG TABS tablet Take 1 tablet (20 mg total) by mouth daily with supper. 30 tablet 1  ? rosuvastatin (CRESTOR) 10 MG tablet Take 1 tablet (10 mg total) by mouth daily. (Patient taking differently: Take 10 mg by mouth every morning.) 15 tablet 0  ? vitamin B-12 1000 MCG tablet Take 1 tablet (1,000 mcg total) by mouth daily. (Patient not taking: Reported on 10/09/2021) 30 tablet 0  ? ? ? ?Results for orders placed or performed during the hospital encounter of 10/12/21 (from the past 48 hour(s))  ?Comprehensive metabolic panel     Status: Abnormal  ? Collection Time: 10/12/21  3:40 PM  ?Result Value Ref Range  ? Sodium 135 135 - 145 mmol/L  ?  Potassium 3.3 (L) 3.5 - 5.1 mmol/L  ? Chloride 100 98 - 111 mmol/L  ? CO2 24 22 - 32 mmol/L  ? Glucose, Bld 134 (H) 70 - 99 mg/dL  ?  Comment: Glucose reference range applies only to samples taken after fasting for at least 8 hours.  ? BUN 21 8 - 23 mg/dL  ? Creatinine, Ser 0.85 0.44 - 1.00 mg/dL  ? Calcium 8.8 (L) 8.9 - 10.3 mg/dL  ? Total Protein 7.7 6.5 - 8.1 g/dL  ? Albumin 2.9 (L) 3.5 - 5.0 g/dL  ? AST 27 15 - 41 U/L  ? ALT 12 0 - 44 U/L  ? Alkaline Phosphatase 44 38 - 126 U/L  ? Total Bilirubin 0.7 0.3 - 1.2 mg/dL  ? GFR, Estimated >60 >60 mL/min  ?  Comment: (NOTE) ?Calculated using the CKD-EPI Creatinine Equation (2021) ?  ? Anion gap 11 5 - 15  ?  Comment: Performed at  [facility], [address], [place], [place] [address]  ?CBC with Differential     Status: Abnormal  ? Collection Time: 10/12/21  3:40 PM  ?Result Value Ref Range  ? WBC 7.0 4.0 - 10.5 K/uL

## 2021-10-12 NOTE — ED Notes (Signed)
Paper consent form was obtained. See paper chart.  ?

## 2021-10-12 NOTE — Pre-Procedure Instructions (Signed)
S 

## 2021-10-12 NOTE — Assessment & Plan Note (Signed)
GI consulted, continue Protonix, continue with PRBC, GI to see and will likely proceed with scope in a few days once off Eliquis long enough ?Continue to monitor serial CBC ?Holding anticoagulation/pharmacologic VTE PPx ?

## 2021-10-12 NOTE — Assessment & Plan Note (Signed)
SSI

## 2021-10-12 NOTE — Assessment & Plan Note (Signed)
Normotensive in ED  ?VS per protocol  ?

## 2021-10-12 NOTE — ED Notes (Signed)
IV team at bedside 

## 2021-10-12 NOTE — Assessment & Plan Note (Signed)
Not in exacerbation

## 2021-10-12 NOTE — ED Provider Triage Note (Signed)
Emergency Medicine Provider Triage Evaluation Note ? ?Courtney Mack , a 66 y.o. female  was evaluated in triage.  Pt complains of symptomatic anemia.  Patient presents with weakness, dizziness, lightheadedness with a hemoglobin of 6.4.  Patient has a history of blood loss anemia, has required multiple transfusions in the past.  Patient was being screened for urologic procedure when it was noted that her hemoglobin was 6.4 and she is symptomatic at this time.. ? ?Review of Systems  ?Positive: Lightheaded, dizziness, weakness, anemic ?Negative: Fever, chills, chest pain, shortness of breath, visible GI blood loss ? ?Physical Exam  ?BP (!) 115/55 (BP Location: Right Arm)   Pulse 97   Temp 98.5 ?F (36.9 ?C) (Oral)   Resp 18   Ht '4\' 11"'$  (1.499 m)   Wt 80.7 kg   SpO2 97%   BMI 35.95 kg/m?  ?Gen:   Awake, no distress   ?Resp:  Normal effort  ?MSK:   Moves extremities without difficulty  ?Other:   ? ?Medical Decision Making  ?Medically screening exam initiated at 3:41 PM.  Appropriate orders placed.  Courtney Mack was informed that the remainder of the evaluation will be completed by another provider, this initial triage assessment does not replace that evaluation, and the importance of remaining in the ED until their evaluation is complete. ? ?Patient with hemoglobin of 6.4 during preop screening earlier today.  History of blood loss anemia.  Has required multiple transfusions in the past.  Labs will be obtained at this time. ?  ?Darletta Moll, PA-C ?10/12/21 1541 ? ?

## 2021-10-12 NOTE — ED Triage Notes (Signed)
Pt to ED via POV after pre-surgery blood work. HGB 6.4 at [facility]. Pt reports she normally runs low and has had to have transfusions in the past. Pt reports lightheaded, dizzy, SOB and loss of appetite. Pt pale in color.  ?

## 2021-10-12 NOTE — ED Notes (Signed)
Two RN attempted to place an IV on pt and were unsuccessful.  ?

## 2021-10-12 NOTE — ED Notes (Signed)
Pt states she went to the doctors office today and had a low hemoglobin and doctor office sent pt here.  ?

## 2021-10-12 NOTE — ED Triage Notes (Signed)
Pt was sent from pre admit for Hbg 6.4.  ?

## 2021-10-12 NOTE — Pre-Procedure Instructions (Addendum)
See previous note from me... ?Called pt and spoke with her directly to let her know her hgb has dropped since March from 7.7 to 6.4 today. Pt very pale and has on 2 jackets. Pt informed that since all offices are closed she needs to go to ED to be evaluated for blood transfusion. Pt verbalized understanding ? ?Called and spoke with Colletta Maryland RN in ED to inform her that pt has been instructed to come to ED due to low hgb in PAT and is on her way to ED ?  ?

## 2021-10-13 DIAGNOSIS — D500 Iron deficiency anemia secondary to blood loss (chronic): Secondary | ICD-10-CM

## 2021-10-13 LAB — HIV ANTIBODY (ROUTINE TESTING W REFLEX): HIV Screen 4th Generation wRfx: NONREACTIVE

## 2021-10-13 LAB — CBC
HCT: 24.8 % — ABNORMAL LOW (ref 36.0–46.0)
Hemoglobin: 7.7 g/dL — ABNORMAL LOW (ref 12.0–15.0)
MCH: 24.8 pg — ABNORMAL LOW (ref 26.0–34.0)
MCHC: 31 g/dL (ref 30.0–36.0)
MCV: 79.7 fL — ABNORMAL LOW (ref 80.0–100.0)
Platelets: 300 10*3/uL (ref 150–400)
RBC: 3.11 MIL/uL — ABNORMAL LOW (ref 3.87–5.11)
RDW: 17.3 % — ABNORMAL HIGH (ref 11.5–15.5)
WBC: 9.3 10*3/uL (ref 4.0–10.5)
nRBC: 0 % (ref 0.0–0.2)

## 2021-10-13 LAB — COMPREHENSIVE METABOLIC PANEL
ALT: 9 U/L (ref 0–44)
AST: 15 U/L (ref 15–41)
Albumin: 2.4 g/dL — ABNORMAL LOW (ref 3.5–5.0)
Alkaline Phosphatase: 38 U/L (ref 38–126)
Anion gap: 6 (ref 5–15)
BUN: 18 mg/dL (ref 8–23)
CO2: 26 mmol/L (ref 22–32)
Calcium: 8.4 mg/dL — ABNORMAL LOW (ref 8.9–10.3)
Chloride: 103 mmol/L (ref 98–111)
Creatinine, Ser: 0.88 mg/dL (ref 0.44–1.00)
GFR, Estimated: >60 mL/min (ref >60)
Glucose, Bld: 103 mg/dL — ABNORMAL HIGH (ref 70–99)
Potassium: 3.5 mmol/L (ref 3.5–5.1)
Sodium: 135 mmol/L (ref 135–145)
Total Bilirubin: 1.3 mg/dL — ABNORMAL HIGH (ref 0.3–1.2)
Total Protein: 6.4 g/dL — ABNORMAL LOW (ref 6.5–8.1)

## 2021-10-13 MED ORDER — TRAMADOL HCL 50 MG PO TABS
50.0000 mg | ORAL_TABLET | Freq: Four times a day (QID) | ORAL | Status: DC | PRN
  Administered 2021-10-13: 50 mg via ORAL
  Filled 2021-10-13: qty 1

## 2021-10-13 MED ORDER — ACETAMINOPHEN 325 MG PO TABS
650.0000 mg | ORAL_TABLET | Freq: Four times a day (QID) | ORAL | Status: DC | PRN
  Administered 2021-10-13: 650 mg via ORAL
  Filled 2021-10-13: qty 2

## 2021-10-13 NOTE — Progress Notes (Signed)
[place] at [facility] ? ? ?PATIENT NAME: Courtney Mack   ? ?MR#:  [id number] ? ?DATE OF BIRTH:  July 16, 1955 ? ?SUBJECTIVE:  ? ?Pt came in after routine pre-op labs showed low hgb ? ? ?VITALS:  ?Blood pressure (!) 94/48, pulse 85, temperature 99.2 ?F (37.3 ?C), resp. rate 20, height '4\' 11"'$  (1.499 m), weight 80.7 kg, SpO2 96 %. ? ?PHYSICAL EXAMINATION:  ? ?GENERAL:  66 y.o.-year-old patient lying in the bed with no acute distress.  ?LUNGS: Normal breath sounds bilaterally, no wheezing, rales, rhonchi.  ?CARDIOVASCULAR: S1, S2 normal. No murmurs, rubs, or gallops.  ?ABDOMEN: Soft, nontender, nondistended. Bowel sounds present.  ?EXTREMITIES: No  edema b/l.    ?NEUROLOGIC: nonfocal  patient is alert and awake ?SKIN: No obvious rash, lesion, or ulcer.  ? ?LABORATORY PANEL:  ?CBC ?Recent Labs  ?Lab 10/13/21 ?0542  ?WBC 9.3  ?HGB 7.7*  ?HCT 24.8*  ?PLT 300  ? ? ?Chemistries  ?Recent Labs  ?Lab 10/13/21 ?0542  ?NA 135  ?K 3.5  ?CL 103  ?CO2 26  ?GLUCOSE 103*  ?BUN 18  ?CREATININE 0.88  ?CALCIUM 8.4*  ?AST 15  ?ALT 9  ?ALKPHOS 38  ?BILITOT 1.3*  ? ?Cardiac Enzymes ?No results for input(s): TROPONINI in the last 168 hours. ?RADIOLOGY:  ?DG Chest Portable 1 View ? ?Result Date: 10/12/2021 ?CLINICAL DATA:  Shortness of breath EXAM: PORTABLE CHEST 1 VIEW COMPARISON:  September 11, 2021 FINDINGS: EKG leads project over the chest. Cardiomediastinal contours and hilar structures are normal. Lungs are clear. No sign of effusion on frontal radiograph. No pneumothorax. On limited assessment there is no acute skeletal process. IMPRESSION: No acute cardiopulmonary disease. Electronically Signed   By: Courtney Mack M.D.   On: 10/12/2021 16:12   ? ?Assessment and Plan ?[name] is a 66 year old female, history of CHF, kidney stones, recent admission to the hospital for septic shock and history of pulmonary emboli on Xarelto, history of GI bleeds requiring multiple transfusions in the past.   ?Patient was  undergoing outpatient preoperative evaluation (planned cystoscopy) and labs revealed low hemoglobin 6.4.  Patient denies blood in stool, dark black stool, nausea/vomiting, she reports chronic abdominal pain which she attributes to her kidney stones. ? ?Small bowel endoscopy 08/17/2021 showed gastric antral vascular ectasia without bleeding. Treated with argon plasma coagulation (APC). ?Capsule endoscopy 06/27/2021 ?EGD and Colonoscopy 06/21/2021 showed gastritis, colon polyp, small diverticula without bleed ? ?anemia acute on chronic suspect slow G.I. bleed  ?-- came in with symptomatic fatigue ability and shortness of breath ?-- found to have hemoglobin of 6.4-- 6.6--- 2 unit blood transfusion-- 7.7 ?-- IV Protonix drip ?-- G.I. consultation with Courtney Mack. Plans to do EGD tomorrow. ? ?History of kidney stones/left ureteral stone with stent placement March 2023 by Dr. Bernardo Mack ?-- patient is planned for definitive treatment of left kidney stone on April 11. ? ?Hypertension ?-- stable ? ?Dyslipidemia ?-- continue statin ? ? ? ?Procedures: ?Family communication : daughter Courtney Mack ?Consults : G.I. Courtney Mack ?CODE STATUS: full ?DVT Prophylaxis :SCD ?Level of care: Med-Surg ?Status is: Inpatient ?Remains inpatient appropriate because: pending EGD for sunday ?  ? ?TOTAL TIME TAKING CARE OF THIS PATIENT: 25 minutes.  ?>50% time spent on counselling and coordination of care ? ?Note: This dictation was prepared with Dragon dictation along with smaller phrase technology. Any transcriptional errors that result from this process are unintentional. ? ?Courtney Mack M.D  ? ? ?[facility]  ? ?CC: ?  Primary care physician; [facility] [place]  ?

## 2021-10-13 NOTE — Progress Notes (Signed)
INFORMED CONSENT  ? ?Consent for EGD scheduled for tomorrow morning (10/14/21), has been signed, and is at the front of the chart.  ?

## 2021-10-13 NOTE — TOC Initial Note (Signed)
Transition of Care (TOC) - Initial/Assessment Note  ? ? ?Patient Details  ?Name: Courtney Mack ?MRN: [id number] ?Date of Birth: [birth year] ? ?Transition of Care (TOC) CM/SW Contact:    ?Sally-Ann Cutbirth E Giana Castner, LCSW ?Phone Number: ?10/13/2021, 11:34 AM ? ?Clinical Narrative:          Patient is from home with daughter and granddaughter.  ?PCP is Princella Ion. Patient has a RW, wheelchair, lift, and bedside commode at home.  ?Confirmed with Corene Cornea with [facility], patient is active with them for PT. ?TOC to follow for additional needs.        ? ? ?Expected Discharge Plan: [place] ?Barriers to Discharge: Continued Medical Work up ? ? ?Patient Goals and CMS Choice ?  ?  ?  ? ?Expected Discharge Plan and Services ?Expected Discharge Plan: [place] ?  ?  ?  ?  ?                ?  ?  ?  ?  ?  ?  ?  ?  ?  ?  ? ?Prior Living Arrangements/Services ?  ?Lives with:: Adult Children, Relatives ?  ?       ?  ?  ?Current home services: DME, Home PT ?  ? ?Activities of Daily Living ?Home Assistive Devices/Equipment: Gilford Rile (specify type) ?ADL Screening (condition at time of admission) ?Patient's cognitive ability adequate to safely complete daily activities?: Yes ?Is the patient deaf or have difficulty hearing?: No ?Does the patient have difficulty seeing, even when wearing glasses/contacts?: Yes ?Does the patient have difficulty concentrating, remembering, or making decisions?: No ?Patient able to express need for assistance with ADLs?: Yes ?Does the patient have difficulty dressing or bathing?: No ?Independently performs ADLs?: Yes (appropriate for developmental age) ?Does the patient have difficulty walking or climbing stairs?: Yes ?Weakness of Legs: None ?Weakness of Arms/Hands: None ? ?Permission Sought/Granted ?  ?  ?   ?   ?   ?   ? ?Emotional Assessment ?  ?  ?  ?  ?Alcohol / Substance Use: Not Applicable ?Psych Involvement: No (comment) ? ?Admission diagnosis:  Guaiac  positive stools [R19.5] ?Anemia due to GI blood loss [D50.0] ?Symptomatic anemia [D64.9] ?Patient Active Problem List  ? Diagnosis Date Noted  ? Anemia due to GI blood loss, symptomatic with fatigue/lightheadedness and shortness of breath 10/12/2021  ? Thrombocytopenia ([place]) 09/13/2021  ? Anemia 09/13/2021  ? Acute on chronic blood loss anemia 07/18/2021  ? HLD (hyperlipidemia) 06/20/2021  ? Chronic diastolic CHF (congestive heart failure) ([place]) 06/20/2021  ? Risk factors for obstructive sleep apnea 11/10/2020  ? Uterine cancer ([place]) 11/08/2020  ? IDA (iron deficiency anemia) 09/21/2020  ? Endometrial cancer ([place]) 09/13/2020  ? Acute blood loss anemia 09/06/2020  ? Vaginal bleeding 09/06/2020  ? Anemia, unspecified 08/24/2020  ? Morbidly obese ([place]) 08/23/2020  ? Essential hypertension 08/23/2020  ? Diabetes mellitus without complication ([place]) 82/50/5397  ? Dyslipidemia 08/23/2020  ? Acute respiratory failure with hypoxia ([place])   ? Vaginal bleeding, abnormal 08/18/2020  ? Morbid obesity ([place]) 08/18/2020  ? ?PCP:  [facility] [place] ?Pharmacy:   ?[id number] [address] ?[place] ?[place] [address] ?Phone: [phone] Fax: [phone] ? ? ? ? ?Social Determinants of Health (SDOH) Interventions ?  ? ?Readmission Risk Interventions ? ?  10/13/2021  ?  11:34 AM 09/12/2021  ?  5:32 PM 09/11/2021  ?  4:04 PM  ?Readmission Risk Prevention Plan  ?Transportation Screening Complete Complete Complete  ?Medication Review Press photographer) Complete  Complete  ?PCP or Specialist appointment within 3-5 days of discharge Complete  Complete  ?[place] or Home Care Consult Complete  Complete  ?SW Recovery Care/Counseling Consult Complete  Complete  ?Palliative Care Screening Complete Complete Not Applicable  ?Skilled Nursing Facility Complete Complete Not Applicable  ? ? ? ?

## 2021-10-13 NOTE — Progress Notes (Signed)
?   10/12/21 2344  ?Assess: MEWS Score  ?Temp 100.2 ?F (37.9 ?C)  ?BP (!) 116/52  ?Pulse Rate (!) 113  ?Resp 18  ?Assess: MEWS Score  ?MEWS Temp 0  ?MEWS Systolic 0  ?MEWS Pulse 2  ?MEWS RR 0  ?MEWS LOC 0  ?MEWS Score 2  ?MEWS Score Color Yellow  ?Assess: if the MEWS score is Yellow or Red  ?Were vital signs taken at a resting state? Yes  ?Focused Assessment No change from prior assessment  ?Does the patient meet 2 or more of the SIRS criteria? No  ?MEWS guidelines implemented *See Row Information* No, previously yellow, continue vital signs every 4 hours  ?Assess: SIRS CRITERIA  ?SIRS Temperature  0  ?SIRS Pulse 1  ?SIRS Respirations  0  ?SIRS WBC 0  ?SIRS Score Sum  1  ? ? ?

## 2021-10-13 NOTE — Consult Note (Signed)
? ? ?[name], MD [facility]  ?[address] ?[place], [place] [address] ?Phone: [phone] ?Fax : [phone] ? Consultation ? ?Referring Provider:     Dr. Sheppard Coil ?Primary Care Physician:  [facility] ?Primary Gastroenterologist:  Dr. Alice Reichert         ?Reason for Consultation:     Anemia ? ?Date of Admission:  10/12/2021 ?Date of Consultation:  10/13/2021 ?       ? HPI:   ?Courtney Mack is a 66 y.o. female with a history of acute on chronic anemia.  The patient has been seen by Dr. Alice Reichert in the past as well as her last admission in February of this year by Dr. Marius Ditch. During her last admission the patient was found to be Covid positive with shortness of breath.  The patient had a history of a lower GI bleed with embolization back in January of this year with embolization of the main splenic flexure branch of the IMA. The patient had a colonoscopy and upper endoscopy in December 2022 with a capsule endoscopy that same month.  The patient then had a push enteroscopy in February of this year. The patient was found to have gastric antral vascular ectasia and was treated with argon plasma coagulation.  The patient came in yesterday with a INR of 1.3 with a hemoglobin of 6.4.  The patient's usual hemoglobin is between 7.1 and 9.2 but has been as low as 4.8 in January. The patient was transfused and her hemoglobin this morning was 7.7.  She was 7.73 weeks ago on March 14. ?The patient has a appointment for cystoscopy due to a renal stone later this month. ?The patient has a history of a recent admission for septic shock and a history of a primary embolus for which she takes Xarelto. The patient was admitted with symptomatic fatigue and lightheadedness with shortness of breath. ?The patient reports that she has abdominal pain but attributes it to her kidney stones.  She has not had any black stools or bloody stools that she has noted. ? ?Past Medical History:  ?Diagnosis Date  ? Acute  bilateral deep vein thrombosis (DVT) of femoral veins ([place]) 01/02/2021  ? Acute massive pulmonary embolism ([place]) 08/25/2020  ? Last Assessment & Plan:  Formatting of this note might be different from the original. Post vena caval filter and on xarelto and O2  ? ARF (acute respiratory failure) ([place])   ? Cancer [facility])   ? endometrial  ? Cellulitis of left lower leg 06/20/2021  ? CHF (congestive heart failure) ([place])   ? COVID-19   ? Diabetes mellitus without complication ([place])   ? DVT (deep venous thrombosis) ([place]) 09/06/2020  ? High cholesterol   ? Hx of blood clots   ? Hyperlipidemia   ? Hypertension   ? IDA (iron deficiency anemia) 09/21/2020  ? Obesity   ? Pressure injury of skin 06/21/2021  ? Pulmonary embolism ([place])   ? Sepsis ([place])   ? Thrombocytopenia ([place])   ? Urinary tract infection 09/13/2021  ? ? ?Past Surgical History:  ?Procedure Laterality Date  ? COLONOSCOPY N/A 06/21/2021  ? Procedure: COLONOSCOPY;  Surgeon: Toledo, Benay Pike, MD;  Location: [facility] ENDOSCOPY;  Service: Gastroenterology;  Laterality: N/A;  ? CYSTOSCOPY W/ URETERAL STENT PLACEMENT Left 09/11/2021  ? Procedure: CYSTOSCOPY WITH RETROGRADE PYELOGRAM/URETERAL STENT PLACEMENT;  Surgeon: Abbie Sons, MD;  Location: [facility] ORS;  Service: Urology;  Laterality: Left;  ? ENTEROSCOPY N/A 08/17/2021  ? Procedure:  ENTEROSCOPY;  Surgeon: Lin Landsman, MD;  Location: [facility] ENDOSCOPY;  Service: Gastroenterology;  Laterality: N/A;  ? ESOPHAGOGASTRODUODENOSCOPY N/A 06/21/2021  ? Procedure: ESOPHAGOGASTRODUODENOSCOPY (EGD);  Surgeon: Toledo, Benay Pike, MD;  Location: [facility] ENDOSCOPY;  Service: Gastroenterology;  Laterality: N/A;  ? ESOPHAGOGASTRODUODENOSCOPY (EGD) WITH PROPOFOL N/A 08/17/2021  ? Procedure: ESOPHAGOGASTRODUODENOSCOPY (EGD) WITH PROPOFOL;  Surgeon: Lin Landsman, MD;  Location: [facility] ENDOSCOPY;  Service: Gastroenterology;  Laterality: N/A;  ? GIVENS CAPSULE STUDY N/A 06/22/2021  ? Procedure: GIVENS CAPSULE STUDY;  Surgeon: Toledo, Benay Pike, MD;  Location: [facility] ENDOSCOPY;  Service: Gastroenterology;  Laterality: N/A;  ? IVC FILTER INSERTION N/A 08/18/2020  ? Procedure: IVC FILTER INSERTION;  Surgeon: Katha Cabal, MD;  Location: [place] CV LAB;  Service: Cardiovascular;  Laterality: N/A;  ? PERIPHERAL VASCULAR THROMBECTOMY Bilateral 01/03/2021  ? Procedure: PERIPHERAL VASCULAR THROMBECTOMY;  Surgeon: Algernon Huxley, MD;  Location: [place] CV LAB;  Service: Cardiovascular;  Laterality: Bilateral;  ? PULMONARY THROMBECTOMY N/A 08/18/2020  ? Procedure: PULMONARY THROMBECTOMY;  Surgeon: Katha Cabal, MD;  Location: [place] CV LAB;  Service: Cardiovascular;  Laterality: N/A;  ? TUBAL LIGATION    ? VISCERAL ANGIOGRAPHY N/A 07/18/2021  ? Procedure: VISCERAL ANGIOGRAPHY;  Surgeon: Algernon Huxley, MD;  Location: [place] CV LAB;  Service: Cardiovascular;  Laterality: N/A;  ? ? ?Prior to Admission medications   ?Medication Sig Start Date End Date Taking? Authorizing Provider  ?Ascorbic Acid (VITAMIN C) 1000 MG tablet Take 1,000 mg by mouth daily.   Yes [provider]  ?Ferrous Sulfate (IRON) 325 (65 Fe) MG TABS Take 325 mg by mouth every other day. 07/05/21  Yes [provider]  ?furosemide (LASIX) 20 MG tablet Take 1 tablet (20 mg total) by mouth daily. ?Patient taking differently: Take 20 mg by mouth every morning. 09/18/21 09/18/22 Yes Annita Brod, MD  ?loperamide (IMODIUM) 2 MG capsule Take 1 capsule (2 mg total) by mouth as needed for diarrhea or loose stools. 06/26/21  Yes Sreenath, Sudheer B, MD  ?mometasone-formoterol (DULERA) 100-5 MCG/ACT AERO Inhale 2 puffs into the lungs 2 (two) times daily.   Yes [provider]  ?Multiple Vitamin (MULTIVITAMIN WITH MINERALS) TABS tablet Take 1 tablet by mouth daily. 09/12/20  Yes [name], MD  ?pantoprazole (PROTONIX) 40 MG tablet Take 40 mg by mouth every morning.   Yes [provider]  ?rivaroxaban (XARELTO) 20 MG TABS tablet Take 1  tablet (20 mg total) by mouth daily with supper. 08/21/21  Yes Fritzi Mandes, MD  ?rosuvastatin (CRESTOR) 10 MG tablet Take 1 tablet (10 mg total) by mouth daily. ?Patient taking differently: Take 10 mg by mouth every morning. 10/03/20  Yes Earlie Server, MD  ?vitamin B-12 1000 MCG tablet Take 1 tablet (1,000 mcg total) by mouth daily. ?Patient not taking: Reported on 10/09/2021 08/19/21   Fritzi Mandes, MD  ? ? ?Family History  ?Problem Relation Age of Onset  ? Hypertension Mother   ? Diabetes Mother   ? Diabetes Father   ? Hypertension Father   ? High Cholesterol Father   ? Congestive Heart Failure Father   ? Breast cancer Cousin   ?  ? ?Social History  ? ?Tobacco Use  ? Smoking status: Former  ?  Packs/day: 0.25  ?  Years: 0.50  ?  Pack years: 0.13  ?  Types: Cigarettes  ?  Quit date: 09/21/1980  ?  Years since quitting: 41.0  ? Smokeless tobacco: Never  ?  Tobacco comments:  ?  smoked 2-4 cigarettes for about 2-3 weeks   ?Vaping Use  ? Vaping Use: Never used  ?Substance Use Topics  ? Alcohol use: Not Currently  ? Drug use: Not Currently  ? ? ?Allergies as of 10/12/2021  ? (No Known Allergies)  ? ? ?Review of Systems:    ?All systems reviewed and negative except where noted in HPI. ? ? Physical Exam:  ?Vital signs in last 24 hours: ?Temp:  [98.5 ?F (36.9 ?C)-100.7 ?F (38.2 ?C)] 100.7 ?F (38.2 ?C) (04/08 2263) ?Pulse Rate:  [83-118] 102 (04/08 0758) ?Resp:  [17-24] 18 (04/08 0758) ?BP: (101-127)/(52-94) 110/55 (04/08 0758) ?SpO2:  [93 %-100 %] 93 % (04/08 0758) ?Weight:  [80.7 kg] 80.7 kg (04/07 1524) ?Last BM Date : 10/11/21 ?General:   Pleasant, cooperative in NAD ?Head:  Normocephalic and atraumatic. ?Eyes:   No icterus.   Conjunctiva pink. PERRLA. ?Ears:  Normal auditory acuity. ?Neck:  Supple; no masses or thyroidomegaly ?Lungs: Respirations even and unlabored. Lungs clear to auscultation bilaterally.   No wheezes, crackles, or rhonchi.  ?Heart:  Regular rate and rhythm;  Without murmur, clicks, rubs or gallops ?Abdomen:   Soft, nondistended, nontender. Normal bowel sounds. No appreciable masses or hepatomegaly.  No rebound or guarding.  ?Rectal:  Not performed. ?Msk:  Symmetrical without gross deformities.    ?Extremities:

## 2021-10-14 ENCOUNTER — Encounter
Admission: EM | Disposition: A | Discharge status: Home / Self Care | Payer: Self-pay | Source: Home / Self Care | Attending: Internal Medicine

## 2021-10-14 ENCOUNTER — Inpatient Hospital Stay: Payer: Medicare (Managed Care) | Admitting: Anesthesiology

## 2021-10-14 DIAGNOSIS — K31819 Angiodysplasia of stomach and duodenum without bleeding: Secondary | ICD-10-CM | POA: Diagnosis not present

## 2021-10-14 DIAGNOSIS — D500 Iron deficiency anemia secondary to blood loss (chronic): Secondary | ICD-10-CM | POA: Diagnosis not present

## 2021-10-14 HISTORY — PX: ESOPHAGOGASTRODUODENOSCOPY (EGD) WITH PROPOFOL: SHX5813

## 2021-10-14 LAB — GLUCOSE, CAPILLARY: Glucose-Capillary: 87 mg/dL (ref 70–99)

## 2021-10-14 LAB — KOH PREP

## 2021-10-14 LAB — HEMOGLOBIN: Hemoglobin: 7.8 g/dL — ABNORMAL LOW (ref 12.0–15.0)

## 2021-10-14 SURGERY — ESOPHAGOGASTRODUODENOSCOPY (EGD) WITH PROPOFOL
Anesthesia: Monitor Anesthesia Care

## 2021-10-14 MED ORDER — FLUCONAZOLE 100 MG PO TABS
200.0000 mg | ORAL_TABLET | Freq: Every day | ORAL | Status: DC
  Filled 2021-10-14: qty 2

## 2021-10-14 MED ORDER — FLUCONAZOLE 200 MG PO TABS: 200.0000 mg | ORAL_TABLET | Freq: Every day | ORAL | 0 refills | Status: DC

## 2021-10-14 MED ORDER — PROPOFOL 500 MG/50ML IV EMUL
INTRAVENOUS | Status: DC | PRN
  Administered 2021-10-14: 100 ug/kg/min via INTRAVENOUS

## 2021-10-14 MED ORDER — FLUCONAZOLE 100 MG PO TABS
400.0000 mg | ORAL_TABLET | Freq: Once | ORAL | Status: AC
  Administered 2021-10-14: 400 mg via ORAL
  Filled 2021-10-14: qty 4

## 2021-10-14 MED ORDER — SODIUM CHLORIDE 0.9 % IV SOLN
INTRAVENOUS | Status: DC
  Administered 2021-10-14: 20 mL/h via INTRAVENOUS

## 2021-10-14 MED ORDER — PROPOFOL 500 MG/50ML IV EMUL
INTRAVENOUS | Status: AC
  Filled 2021-10-14: qty 50

## 2021-10-14 MED ORDER — PROPOFOL 10 MG/ML IV BOLUS
INTRAVENOUS | Status: DC | PRN
  Administered 2021-10-14: 40 mg via INTRAVENOUS

## 2021-10-14 MED ORDER — NYSTATIN 100000 UNIT/ML MT SUSP: 5.0000 mL | Freq: Three times a day (TID) | OROMUCOSAL | 0 refills | Status: DC

## 2021-10-14 MED ORDER — NYSTATIN 100000 UNIT/ML MT SUSP
5.0000 mL | Freq: Three times a day (TID) | OROMUCOSAL | Status: DC
  Administered 2021-10-14: 500000 [IU] via ORAL
  Filled 2021-10-14: qty 5

## 2021-10-14 NOTE — Discharge Instructions (Signed)
Patient to hold Xarelto for now.  ?She can resume taking from 13th April 2023 after completion of urology procedure.  ?

## 2021-10-14 NOTE — Progress Notes (Signed)
Courtney Mack to be D/C'd Home per MD order.  Discussed prescriptions and follow up appointments with the patient. Prescriptions given to patient, medication list explained in detail. Pt verbalized understanding. ? ?Allergies as of 10/14/2021   ?No Known Allergies ?  ? ?  ?Medication List  ?  ? ?STOP taking these medications   ? ?cyanocobalamin 1000 MCG tablet ?  ? ?  ? ?TAKE these medications   ? ?fluconazole 200 MG tablet ?Commonly known as: DIFLUCAN ?Take 1 tablet (200 mg total) by mouth daily for 6 days. ?Start taking on: October 15, 2021 ?  ?furosemide 20 MG tablet ?Commonly known as: Lasix ?Take 1 tablet (20 mg total) by mouth daily. ?What changed: when to take this ?  ?Iron 325 (65 Fe) MG Tabs ?Take 325 mg by mouth every other day. ?  ?loperamide 2 MG capsule ?Commonly known as: IMODIUM ?Take 1 capsule (2 mg total) by mouth as needed for diarrhea or loose stools. ?  ?mometasone-formoterol 100-5 MCG/ACT Aero ?Commonly known as: DULERA ?Inhale 2 puffs into the lungs 2 (two) times daily. ?  ?multivitamin with minerals Tabs tablet ?Take 1 tablet by mouth daily. ?  ?nystatin 100000 UNIT/ML suspension ?Commonly known as: MYCOSTATIN ?Take 5 mLs (500,000 Units total) by mouth 3 (three) times daily. For 7-8 days ?  ?pantoprazole 40 MG tablet ?Commonly known as: PROTONIX ?Take 40 mg by mouth every morning. ?  ?rivaroxaban 20 MG Tabs tablet ?Commonly known as: XARELTO ?Take 1 tablet (20 mg total) by mouth daily with supper. ?Notes to patient: Start taking from 10/18/2021 ?  ?rosuvastatin 10 MG tablet ?Commonly known as: CRESTOR ?Take 1 tablet (10 mg total) by mouth daily. ?What changed: when to take this ?  ?vitamin C 1000 MG tablet ?Take 1,000 mg by mouth daily. ?  ? ?  ? ? ?Vitals:  ? 10/14/21 1415 10/14/21 1519  ?BP: (!) 124/59 (!) 133/58  ?Pulse: 91 89  ?Resp: (!) 25 18  ?Temp:  98.9 ?F (37.2 ?C)  ?SpO2: 95% 95%  ? ? ?Skin clean, dry and intact without evidence of skin break down, no evidence of skin tears noted. IV  catheter discontinued intact. Site without signs and symptoms of complications. Dressing and pressure applied. Pt denies pain at this time. No complaints noted. ? ?An After Visit Summary was printed and given to the patient. ?Patient escorted via [place], and D/C home via private auto. ? ?Fuller Mandril, RN  ? ? ?

## 2021-10-14 NOTE — Discharge Summary (Signed)
?Physician Discharge Summary ?  ?Patient: Courtney Mack MRN: [id number] DOB: 07/15/1955  ?Admit date:     10/12/2021  ?Discharge date: 10/14/21  ?Discharge Physician: Fritzi Mandes  ? ?PCP: [facility] [place]  ? ?Recommendations at discharge:  ? ?follow-up with your PCP in 1 to 2 weeks. ?Follow-up April 11 your urology scheduled procedure. ?Patient to discuss with urology Dr. Bernardo Heater regarding resumption of Xarelto. Till then hold for now having had Argon laser treatment for gastric AVM ? ?Discharge Diagnoses: ?acute on chronic anemia secondary to G.I. slow blood loss suspected due to gastric AVM in the setting of chronic anticoagulation status post EGD ? ?esophageal candidiasis ? ?Hospital Course: ? ?Courtney Mack is a 66 year old female, history of CHF, kidney stones, recent admission to the hospital for septic shock and history of pulmonary emboli on Xarelto, history of GI bleeds requiring multiple transfusions in the past.   ?Patient was undergoing outpatient preoperative evaluation (planned cystoscopy) and labs revealed low hemoglobin 6.4.  Patient denies blood in stool, dark black stool, nausea/vomiting, she reports chronic abdominal pain which she attributes to her kidney stones. ?  ?Small bowel endoscopy 08/17/2021 showed gastric antral vascular ectasia without bleeding. Treated with argon plasma coagulation (APC). ?Capsule endoscopy 06/27/2021 ?EGD and Colonoscopy 06/21/2021 showed gastritis, colon polyp, small diverticula without bleed ?  ?anemia acute on chronic suspect slow G.I. bleed  ?-- came in with symptomatic fatigue ability and shortness of breath ?-- found to have hemoglobin of 6.4-- 6.6--- 2 unit blood transfusion-- 7.7 ?-- IV Protonix drip ?-- G.I. consultation with Dr. Allen Norris ?-- EGD  Esophageal plaques were found, suspicious for  ?                       candidiasis. Cells for cytology obtained. ?                       - Gastric antral vascular ectasia without bleeding.  ?                        Treated with argon plasma coagulation (APC). ?                       - Normal examined duodenum. ?                       - No sign of active bleeding ?-- will start patient on Diflucan 400 mg once and 200 mg daily for seven days along with nystatin switch and swallow ?-- Xarelto for two days. I have asked patient to resume after discussing with Dr. Bernardo Heater for her urology procedure scheduled for April 11 if okay then resume it. ?-- Hemoglobin 7.8 ?  ?History of kidney stones/left ureteral stone with stent placement March 2023 by Dr. Bernardo Heater ?-- patient is planned for definitive treatment of left kidney stone on April 11. ?  ?Hypertension ?-- stable ?  ?Dyslipidemia ?-- continue statin ? ?overall stable. Will discharged to home. ? ?  ? ? ?Consultants: G.I. ?Procedures performed: EGD ?Disposition: Home ?Diet recommendation:  ?Discharge Diet Orders (From admission, onward)  ? ?  Start     Ordered  ? 10/14/21 0000  Diet - low sodium heart healthy       ? 10/14/21 1519  ? ?  ?  ? ?  ? ?Cardiac diet ?DISCHARGE MEDICATION: ?Allergies as of 10/14/2021   ?  No Known Allergies ?  ? ?  ?Medication List  ?  ? ?STOP taking these medications   ? ?cyanocobalamin 1000 MCG tablet ?  ? ?  ? ?TAKE these medications   ? ?fluconazole 200 MG tablet ?Commonly known as: DIFLUCAN ?Take 1 tablet (200 mg total) by mouth daily for 6 days. ?Start taking on: October 15, 2021 ?  ?furosemide 20 MG tablet ?Commonly known as: Lasix ?Take 1 tablet (20 mg total) by mouth daily. ?What changed: when to take this ?  ?Iron 325 (65 Fe) MG Tabs ?Take 325 mg by mouth every other day. ?  ?loperamide 2 MG capsule ?Commonly known as: IMODIUM ?Take 1 capsule (2 mg total) by mouth as needed for diarrhea or loose stools. ?  ?mometasone-formoterol 100-5 MCG/ACT Aero ?Commonly known as: DULERA ?Inhale 2 puffs into the lungs 2 (two) times daily. ?  ?multivitamin with minerals Tabs tablet ?Take 1 tablet by mouth daily. ?  ?nystatin 100000 UNIT/ML  suspension ?Commonly known as: MYCOSTATIN ?Take 5 mLs (500,000 Units total) by mouth 3 (three) times daily. For 7-8 days ?  ?pantoprazole 40 MG tablet ?Commonly known as: PROTONIX ?Take 40 mg by mouth every morning. ?  ?rivaroxaban 20 MG Tabs tablet ?Commonly known as: XARELTO ?Take 1 tablet (20 mg total) by mouth daily with supper. ?Notes to patient: Start taking from 10/18/2021 ?  ?rosuvastatin 10 MG tablet ?Commonly known as: CRESTOR ?Take 1 tablet (10 mg total) by mouth daily. ?What changed: when to take this ?  ?vitamin C 1000 MG tablet ?Take 1,000 mg by mouth daily. ?  ? ?  ? ? Follow-up Information   ? ? [facility] [place]. Go in 1 week(s).   ?Specialty: General Practice ?Contact information: ?[address] ?[place] [address] ?[phone] ? ? ?  ?  ? ?  ?  ? ?  ? ?Discharge Exam: ?Danley Danker Weights  ? 10/12/21 1524  ?Weight: 80.7 kg  ? ? ? ?Condition at discharge:  ? ?The results of significant diagnostics from this hospitalization (including imaging, microbiology, ancillary and laboratory) are listed below for reference.  ? ?Imaging Studies: ?DG Chest Portable 1 View ? ?Result Date: 10/12/2021 ?CLINICAL DATA:  Shortness of breath EXAM: PORTABLE CHEST 1 VIEW COMPARISON:  September 11, 2021 FINDINGS: EKG leads project over the chest. Cardiomediastinal contours and hilar structures are normal. Lungs are clear. No sign of effusion on frontal radiograph. No pneumothorax. On limited assessment there is no acute skeletal process. IMPRESSION: No acute cardiopulmonary disease. Electronically Signed   By: Zetta Bills M.D.   On: 10/12/2021 16:12   ? ?Microbiology: ?Results for orders placed or performed in visit on 10/08/21  ?CULTURE, URINE COMPREHENSIVE     Status: None  ? Collection Time: 10/08/21  4:41 PM  ? Specimen: Urine  ? UR  ?Result Value Ref Range Status  ? Urine Culture, Comprehensive Final report  Final  ? Organism ID, Bacteria Comment  Final  ?  Comment: More than 3 organisms  recovered, none predominant. Please submit ?another culture if clinically indicated. ?50,000-100,000 colony forming units per mL ?  ?Microscopic Examination     Status: Abnormal  ? Collection Time: 10/08/21  4:48 PM  ? Urine  ?Result Value Ref Range Status  ? WBC, UA >30 (H) 0 - 5 /hpf Final  ? RBC >30 (H) 0 - 2 /hpf Final  ? Epithelial Cells (non renal) 0-10 0 - 10 /hpf Final  ? Bacteria, UA Many (  A) None seen/Few Final  ? ? ?Labs: ?CBC: ?Recent Labs  ?Lab 10/12/21 ?1238 10/12/21 ?1540 10/13/21 ?0175 10/14/21 ?1025  ?WBC 6.2 7.0 9.3  --   ?NEUTROABS  --  5.7  --   --   ?HGB 6.4* 6.6* 7.7* 7.8*  ?HCT 21.5* 22.7* 24.8*  --   ?MCV 79.9* 81.1 79.7*  --   ?PLT 338 349 300  --   ? ?Basic Metabolic Panel: ?Recent Labs  ?Lab 10/12/21 ?1540 10/13/21 ?0542  ?NA 135 135  ?K 3.3* 3.5  ?CL 100 103  ?CO2 24 26  ?GLUCOSE 134* 103*  ?BUN 21 18  ?CREATININE 0.85 0.88  ?CALCIUM 8.8* 8.4*  ? ?Liver Function Tests: ?Recent Labs  ?Lab 10/12/21 ?1540 10/13/21 ?0542  ?AST 27 15  ?ALT 12 9  ?ALKPHOS 44 38  ?BILITOT 0.7 1.3*  ?PROT 7.7 6.4*  ?ALBUMIN 2.9* 2.4*  ? ?CBG: ?Recent Labs  ?Lab 10/14/21 ?1304  ?GLUCAP 87  ? ? ?Discharge time spent: greater than 30 minutes. ? ?Signed: ?Fritzi Mandes, MD ?[facility] ?10/14/2021 ?

## 2021-10-14 NOTE — Op Note (Signed)
[facility] ?Gastroenterology ?Patient Name: Tanielle Emigh ?Procedure Date: 10/14/2021 12:58 PM ?MRN: [id number] ?Account #: [id number] ?Date of Birth: 01/03/1956 ?Admit Type: Outpatient ?Age: 66 ?Room: [facility] ENDO ROOM 1 ?Gender: Female ?Note Status: Finalized ?Instrument Name: Upper Endoscope [id number] ?Procedure:             Upper GI endoscopy ?Indications:           Iron deficiency anemia ?Providers:             [name] MD, MD ?Medicines:             Propofol per Anesthesia ?Complications:         No immediate complications. ?Procedure:             Pre-Anesthesia Assessment: ?                       - Prior to the procedure, a History and Physical was  ?                       performed, and patient medications and allergies were  ?                       reviewed. The patient's tolerance of previous  ?                       anesthesia was also reviewed. The risks and benefits  ?                       of the procedure and the sedation options and risks  ?                       were discussed with the patient. All questions were  ?                       answered, and informed consent was obtained. Prior  ?                       Anticoagulants: The patient has taken Eliquis  ?                       (apixaban), last dose was 4 days prior to procedure.  ?                       ASA Grade Assessment: II - A patient with mild  ?                       systemic disease. After reviewing the risks and  ?                       benefits, the patient was deemed in satisfactory  ?                       condition to undergo the procedure. ?                       After obtaining informed consent, the endoscope was  ?                       passed under direct vision.  Throughout the procedure,  ?                       the patient's blood pressure, pulse, and oxygen  ?                       saturations were monitored continuously. The Endoscope  ?                       was introduced through the mouth, and advanced  to the  ?                       second part of duodenum. The upper GI endoscopy was  ?                       accomplished without difficulty. The patient tolerated  ?                       the procedure well. ?Findings: ?     Patchy, white plaques were found in the entire esophagus. Cells for  ?     cytology were obtained by brushing. ?     Moderate gastric antral vascular ectasia without bleeding was present in  ?     the gastric antrum. Coagulation for tissue destruction using argon  ?     plasma at 2 liters/minute and 30 watts was successful. ?     The examined duodenum was normal. ?Impression:            - Esophageal plaques were found, suspicious for  ?                       candidiasis. Cells for cytology obtained. ?                       - Gastric antral vascular ectasia without bleeding.  ?                       Treated with argon plasma coagulation (APC). ?                       - Normal examined duodenum. ?                       - No sign of active bleeding ?Recommendation:        - Return patient to hospital ward for ongoing care. ?                       - Resume previous diet. ?                       - Continue present medications. ?Procedure Code(s):     --- Professional --- ?                       [phone], Esophagogastroduodenoscopy, flexible,  ?                       transoral; with ablation of tumor(s), polyp(s), or  ?                       other lesion(s) (includes pre-  and post-dilation and  ?                       guide wire passage, when performed) ?Diagnosis Code(s):     --- Professional --- ?                       D50.9, Iron deficiency anemia, unspecified ?                       K31.819, Angiodysplasia of stomach and duodenum  ?                       without bleeding ?                       K22.9, Disease of esophagus, unspecified ?CPT copyright 2019 American Medical Association. All rights reserved. ?The codes documented in this report are preliminary and upon coder review may  ?be revised to  meet current compliance requirements. ?[name] MD, MD ?10/14/2021 1:57:55 PM ?This report has been signed electronically. ?Number of Addenda: 0 ?Note Initiated On: 10/14/2021 12:58 PM ?Estimated Blood Loss:  Estimated blood loss: none. ?     [facility] ?

## 2021-10-14 NOTE — Anesthesia Procedure Notes (Signed)
Date/Time: 10/14/2021 1:45 PM ?Performed by: Nelda Marseille, CRNA ?Pre-anesthesia Checklist: Patient identified, Emergency Drugs available, Suction available, Patient being monitored and Timeout performed ?Oxygen Delivery Method: Nasal cannula ? ? ? ? ?

## 2021-10-14 NOTE — Anesthesia Postprocedure Evaluation (Signed)
Anesthesia Post Note ? ?Patient: Courtney Mack ? ?Procedure(s) Performed: ESOPHAGOGASTRODUODENOSCOPY (EGD) WITH PROPOFOL ? ?Patient location during evaluation: PACU ?Anesthesia Type: MAC and General ?Level of consciousness: awake and alert ?Pain management: pain level controlled ?Vital Signs Assessment: post-procedure vital signs reviewed and stable ?Respiratory status: spontaneous breathing, nonlabored ventilation, respiratory function stable and patient connected to nasal cannula oxygen ?Cardiovascular status: blood pressure returned to baseline and stable ?Postop Assessment: no apparent nausea or vomiting ?Anesthetic complications: no ?Comments: IVGA ? ? ?No notable events documented. ? ? ?Last Vitals:  ?Vitals:  ? 10/14/21 1415 10/14/21 1519  ?BP: (!) 124/59 (!) 133/58  ?Pulse: 91 89  ?Resp: (!) 25 18  ?Temp:  37.2 ?C  ?SpO2: 95% 95%  ?  ?Last Pain:  ?Vitals:  ? 10/14/21 1519  ?TempSrc: Oral  ?PainSc:   ? ? ?  ?  ?  ?  ?  ?  ? ?Courtney Mack ? ? ? ? ?

## 2021-10-14 NOTE — Transfer of Care (Signed)
Immediate Anesthesia Transfer of Care Note ? ?Patient: Courtney Mack ? ?Procedure(s) Performed: ESOPHAGOGASTRODUODENOSCOPY (EGD) WITH PROPOFOL ? ?Patient Location: PACU ? ?Anesthesia Type:General ? ?Level of Consciousness: awake, alert  and oriented ? ?Airway & Oxygen Therapy: Patient Spontanous Breathing and Patient connected to nasal cannula oxygen ? ?Post-op Assessment: Report given to RN and Post -op Vital signs reviewed and stable ? ?Post vital signs: Reviewed and stable ? ?Last Vitals:  ?Vitals Value Taken Time  ?BP    ?Temp    ?Pulse    ?Resp    ?SpO2    ? ? ?Last Pain:  ?Vitals:  ? 10/14/21 1249  ?TempSrc: Temporal  ?PainSc:   ?   ? ?  ? ?Complications: No notable events documented. ?

## 2021-10-14 NOTE — Anesthesia Preprocedure Evaluation (Addendum)
Anesthesia Evaluation  ?Patient identified by MRN, date of birth, ID band ?Patient awake ? ? ? ?Reviewed: ?Allergy & Precautions, H&P , NPO status , Patient's Chart, lab work & pertinent test results ? ?History of Anesthesia Complications ?Negative for: history of anesthetic complications ? ?Airway ?Mallampati: II ? ?TM Distance: >3 FB ?Neck ROM: full ? ? ? Dental ? ?(+) Upper Dentures,  ?  ?Pulmonary ?neg sleep apnea, neg COPD, former smoker, PE ?  ?breath sounds clear to auscultation ? ? ? ? ? ? Cardiovascular ?hypertension, (-) angina+CHF and + DVT  ?(-) Past MI and (-) Cardiac Stents (-) dysrhythmias  ?Rhythm:regular Rate:Normal ? ?H/o CHF on chart; Lasix daily. ?Essentially normal echo (below) ? ?Echo 09/12/21: ?1. Left ventricular ejection fraction, by estimation, is 55 to 60%. The left ventricle has normal function. The left ventricle has no regional wall motion abnormalities. There is mild asymmetric left ventricular hypertrophy of the basal-septal segment. Left ventricular diastolic parameters were normal.  ??2. Right ventricular systolic function is normal. The right ventricular size is normal.  ??3. Left atrial size was mildly dilated.  ??4. The mitral valve is normal in structure. Mild mitral valve  ?regurgitation. No evidence of mitral stenosis.  ??5. The aortic valve is normal in structure. Aortic valve regurgitation is not visualized. No aortic stenosis is present.  ??6. The inferior vena cava is normal in size with greater than 50% respiratory variability, suggesting right atrial pressure of 3 mmHg.  ? ?EKG 10/04/21: NSR. Normal EKG ? ? ?HLD ?  ?Neuro/Psych ?negative neurological ROS ? negative psych ROS  ? GI/Hepatic ?negative GI ROS, Neg liver ROS, H/o GIB ? ?Presented with anemia and occult blood positive stool ? ?Suspected UGIB ? ?S/f EGD ?  ?Endo/Other  ?diabetesObese BMI 36 ? ? Renal/GU ?  ? ?  ?Musculoskeletal ? ? Abdominal ?  ?Peds ? Hematology ?negative  hematology ROS ?(+) Blood dyscrasia, anemia , Hgb 6.6 --> 7.7 s/p 2 units pRBC on 4/8 ?7.8 on 4/9 ? ?Xarelto   ?Anesthesia Other Findings ?Past Medical History: ?01/02/2021: Acute bilateral deep vein thrombosis (DVT) of femoral  ?veins ([place]) ?08/25/2020: Acute massive pulmonary embolism ([place]) ?    Comment:  Last Assessment & Plan:  Formatting of this note might  ?             be different from the original. Post vena caval filter  ?             and on xarelto and O2 ?No date: ARF (acute respiratory failure) ([place]) ?No date: [facility]) ?    Comment:  endometrial ?06/20/2021: Cellulitis of left lower leg ?No date: CHF (congestive heart failure) ([place]) ?No date: COVID-19 ?No date: Diabetes mellitus without complication ([place]) ?09/06/2020: DVT (deep venous thrombosis) ([place]) ?No date: High cholesterol ?No date: Hx of blood clots ?No date: Hyperlipidemia ?No date: Hypertension ?09/21/2020: IDA (iron deficiency anemia) ?No date: Obesity ?06/21/2021: Pressure injury of skin ?No date: Pulmonary embolism ([place]) ?No date: Sepsis ([place]) ?No date: Thrombocytopenia ([place]) ?09/13/2021: Urinary tract infection ? ?Past Surgical History: ?06/21/2021: COLONOSCOPY; N/A ?    Comment:  Procedure: COLONOSCOPY;  Surgeon: [place], Benay Pike, MD; ?             Location: [facility] ENDOSCOPY;  Service: Gastroenterology;   ?             Laterality: N/A; ?09/11/2021: CYSTOSCOPY W/ URETERAL STENT PLACEMENT; Left ?    Comment:  Procedure: CYSTOSCOPY WITH RETROGRADE PYELOGRAM/URETERAL ?  STENT PLACEMENT;  Surgeon: Abbie Sons, MD;   ?             Location: [facility] ORS;  Service: Urology;  Laterality: Left; ?08/17/2021: ENTEROSCOPY; N/A ?    Comment:  Procedure: ENTEROSCOPY;  Surgeon: Lin Landsman,  ?             MD;  Location: [facility] ENDOSCOPY;  Service:  ?             Gastroenterology;  Laterality: N/A; ?06/21/2021: ESOPHAGOGASTRODUODENOSCOPY; N/A ?    Comment:  Procedure: ESOPHAGOGASTRODUODENOSCOPY (EGD);  Surgeon:  ?             Efrain Sella, MD;  Location: [facility] ENDOSCOPY;   ?             Service: Gastroenterology;  Laterality: N/A; ?08/17/2021: ESOPHAGOGASTRODUODENOSCOPY (EGD) WITH PROPOFOL; N/A ?    Comment:  Procedure: ESOPHAGOGASTRODUODENOSCOPY (EGD) WITH  ?             PROPOFOL;  Surgeon: Lin Landsman, MD;  Location:  ?             [facility] ENDOSCOPY;  Service: Gastroenterology;  Laterality:  ?             N/A; ?06/22/2021: GIVENS CAPSULE STUDY; N/A ?    Comment:  Procedure: GIVENS CAPSULE STUDY;  Surgeon: [place],  ?             Benay Pike, MD;  Location: [facility] ENDOSCOPY;  Service:  ?             Gastroenterology;  Laterality: N/A; ?08/18/2020: IVC FILTER INSERTION; N/A ?    Comment:  Procedure: IVC FILTER INSERTION;  Surgeon: Delana Meyer,  ?             Dolores Lory, MD;  Location: [place] CV LAB;  Service: ?             Cardiovascular;  Laterality: N/A; ?01/03/2021: PERIPHERAL VASCULAR THROMBECTOMY; Bilateral ?    Comment:  Procedure: PERIPHERAL VASCULAR THROMBECTOMY;  Surgeon:  ?             Algernon Huxley, MD;  Location: [place] CV LAB;   ?             Service: Cardiovascular;  Laterality: Bilateral; ?08/18/2020: PULMONARY THROMBECTOMY; N/A ?    Comment:  Procedure: PULMONARY THROMBECTOMY;  Surgeon: Delana Meyer,  ?             Dolores Lory, MD;  Location: [place] CV LAB;  Service: ?             Cardiovascular;  Laterality: N/A; ?No date: TUBAL LIGATION ?07/18/2021: VISCERAL ANGIOGRAPHY; N/A ?    Comment:  Procedure: VISCERAL ANGIOGRAPHY;  Surgeon: Algernon Huxley, ?             MD;  Location: [place] CV LAB;  Service:  ?             Cardiovascular;  Laterality: N/A; ? ?BMI   ? Body Mass Index: 35.95 kg/m?  ?  ? ? Reproductive/Obstetrics ?negative OB ROS ? ?  ? ? ? ? ? ? ? ? ? ? ? ? ? ?  ?  ? ? ? ? ? ? ?Anesthesia Physical ?Anesthesia Plan ? ?ASA: 3 ? ?Anesthesia Plan: General and MAC  ? ?Post-op Pain Management:   ? ?Induction:  ? ?PONV Risk Score and Plan: Treatment may vary due to age or  medical condition ? ?Airway Management Planned:  Simple Face Mask ? ?Additional Equipment:  ? ?Intra-op Plan:  ? ?Post-operative Plan:  ? ?Informed Consent: I have reviewed the patients History and Physical, chart, labs and discussed the procedure including the risks, benefits and alternatives for the proposed anesthesia with the patient or authorized representative who has indicated his/her understanding and acceptance.  ? ? ? ?Dental Advisory Given ? ?Plan Discussed with: Anesthesiologist, CRNA and Surgeon ? ?Anesthesia Plan Comments:   ? ? ? ? ? ?Anesthesia Quick Evaluation ? ? ?66 year old female, history of CHF, recent admission to the hospital for septic shock and history of pulmonary emboli on Xarelto, history of GI bleeds requiring multiple transfusions in the past.  Presented after routine pre-op labs (for cysto) showed anemia, found to have occult positive stool, suspected UGIB. ? ? ?Hgb 7.8 on 4/9 ?IVGA native airway. NPO appropriate. Denies N/V. GETA backup ?Standard monitors ? ?Risks/benefits discussed and pt agrees to proceed with anesthetic. ? ?K Sakai Heinle ?

## 2021-10-14 NOTE — Plan of Care (Signed)
?  Problem: Clinical Measurements: ?Goal: Diagnostic test results will improve ?Outcome: Progressing ?Goal: Cardiovascular complication will be avoided ?Outcome: Progressing ?  ?Problem: Nutrition: ?Goal: Adequate nutrition will be maintained ?Outcome: Progressing ?  ?Problem: Elimination: ?Goal: Will not experience complications related to urinary retention ?Outcome: Progressing ?  ?Problem: Pain Managment: ?Goal: General experience of comfort will improve ?Outcome: Progressing ?  ?Problem: Safety: ?Goal: Ability to remain free from injury will improve ?Outcome: Progressing ?  ?

## 2021-10-15 ENCOUNTER — Encounter: Payer: Self-pay | Admitting: Gastroenterology

## 2021-10-15 LAB — TYPE AND SCREEN
ABO/RH(D): A POS
Antibody Screen: POSITIVE
Donor AG Type: NEGATIVE
Donor AG Type: NEGATIVE
Unit division: 0
Unit division: 0
Unit division: 0
Unit division: 0
Unit division: 0

## 2021-10-15 LAB — BPAM RBC
Blood Product Expiration Date: 202304252359
Blood Product Expiration Date: 202305012359
Blood Product Expiration Date: 202305012359
Blood Product Expiration Date: 202305012359
Blood Product Expiration Date: 202305032359
ISSUE DATE / TIME: 202304072321
ISSUE DATE / TIME: 202304080159
Unit Type and Rh: 5100
Unit Type and Rh: 5100
Unit Type and Rh: 6200
Unit Type and Rh: 6200
Unit Type and Rh: 6200

## 2021-10-15 MED ORDER — ORAL CARE MOUTH RINSE: 15.0000 mL | Freq: Once | OROMUCOSAL | Status: AC

## 2021-10-15 MED ORDER — CHLORHEXIDINE GLUCONATE 0.12 % MT SOLN
15.0000 mL | Freq: Once | OROMUCOSAL | Status: AC
  Administered 2021-10-16: 15 mL via OROMUCOSAL

## 2021-10-15 MED ORDER — GENTAMICIN SULFATE 40 MG/ML IJ SOLN
5.0000 mg/kg | INTRAVENOUS | Status: AC
  Administered 2021-10-16: 400 mg via INTRAVENOUS
  Filled 2021-10-15: qty 10

## 2021-10-15 MED ORDER — LACTATED RINGERS IV SOLN
INTRAVENOUS | Status: DC
Start: 2021-10-15 — End: 2021-10-16

## 2021-10-16 ENCOUNTER — Ambulatory Visit
Admission: RE | Admit: 2021-10-16 | Discharge: 2021-10-16 | Disposition: A | Discharge status: Home / Self Care | Payer: Medicare (Managed Care) | Source: Home / Self Care | Attending: Urology | Admitting: Urology

## 2021-10-16 ENCOUNTER — Encounter
Admission: RE | Disposition: A | Discharge status: Home / Self Care | Payer: Self-pay | Source: Home / Self Care | Attending: Urology

## 2021-10-16 ENCOUNTER — Encounter: Payer: Self-pay | Admitting: Urology

## 2021-10-16 ENCOUNTER — Emergency Department: Payer: Medicare (Managed Care)

## 2021-10-16 ENCOUNTER — Ambulatory Visit: Payer: Medicare (Managed Care) | Admitting: Urgent Care

## 2021-10-16 ENCOUNTER — Ambulatory Visit: Payer: Medicare (Managed Care)

## 2021-10-16 ENCOUNTER — Other Ambulatory Visit: Payer: Self-pay

## 2021-10-16 ENCOUNTER — Inpatient Hospital Stay
Admission: EM | Admit: 2021-10-16 | Discharge: 2021-10-23 | DRG: 659 | Disposition: A | Discharge status: Home Health | Payer: Medicare (Managed Care) | Attending: Internal Medicine | Admitting: Internal Medicine

## 2021-10-16 DIAGNOSIS — N201 Calculus of ureter: Secondary | ICD-10-CM

## 2021-10-16 DIAGNOSIS — T83592A Infection and inflammatory reaction due to indwelling ureteral stent, initial encounter: Secondary | ICD-10-CM | POA: Diagnosis not present

## 2021-10-16 DIAGNOSIS — D509 Iron deficiency anemia, unspecified: Secondary | ICD-10-CM | POA: Diagnosis present

## 2021-10-16 DIAGNOSIS — N136 Pyonephrosis: Secondary | ICD-10-CM | POA: Diagnosis present

## 2021-10-16 DIAGNOSIS — R0902 Hypoxemia: Secondary | ICD-10-CM | POA: Diagnosis present

## 2021-10-16 DIAGNOSIS — Z83438 Family history of other disorder of lipoprotein metabolism and other lipidemia: Secondary | ICD-10-CM

## 2021-10-16 DIAGNOSIS — Z87891 Personal history of nicotine dependence: Secondary | ICD-10-CM | POA: Insufficient documentation

## 2021-10-16 DIAGNOSIS — N202 Calculus of kidney with calculus of ureter: Secondary | ICD-10-CM | POA: Diagnosis present

## 2021-10-16 DIAGNOSIS — J9601 Acute respiratory failure with hypoxia: Secondary | ICD-10-CM | POA: Diagnosis present

## 2021-10-16 DIAGNOSIS — Z87442 Personal history of urinary calculi: Secondary | ICD-10-CM

## 2021-10-16 DIAGNOSIS — Z8616 Personal history of COVID-19: Secondary | ICD-10-CM

## 2021-10-16 DIAGNOSIS — I5032 Chronic diastolic (congestive) heart failure: Secondary | ICD-10-CM | POA: Diagnosis present

## 2021-10-16 DIAGNOSIS — A419 Sepsis, unspecified organism: Secondary | ICD-10-CM

## 2021-10-16 DIAGNOSIS — R6521 Severe sepsis with septic shock: Secondary | ICD-10-CM | POA: Diagnosis present

## 2021-10-16 DIAGNOSIS — Z833 Family history of diabetes mellitus: Secondary | ICD-10-CM

## 2021-10-16 DIAGNOSIS — E785 Hyperlipidemia, unspecified: Secondary | ICD-10-CM | POA: Diagnosis present

## 2021-10-16 DIAGNOSIS — N170 Acute kidney failure with tubular necrosis: Secondary | ICD-10-CM | POA: Diagnosis present

## 2021-10-16 DIAGNOSIS — I110 Hypertensive heart disease with heart failure: Secondary | ICD-10-CM | POA: Diagnosis present

## 2021-10-16 DIAGNOSIS — B3781 Candidal esophagitis: Secondary | ICD-10-CM | POA: Diagnosis not present

## 2021-10-16 DIAGNOSIS — I959 Hypotension, unspecified: Secondary | ICD-10-CM | POA: Diagnosis present

## 2021-10-16 DIAGNOSIS — N1330 Unspecified hydronephrosis: Secondary | ICD-10-CM

## 2021-10-16 DIAGNOSIS — Z86718 Personal history of other venous thrombosis and embolism: Secondary | ICD-10-CM

## 2021-10-16 DIAGNOSIS — Y831 Surgical operation with implant of artificial internal device as the cause of abnormal reaction of the patient, or of later complication, without mention of misadventure at the time of the procedure: Secondary | ICD-10-CM | POA: Diagnosis present

## 2021-10-16 DIAGNOSIS — Z86711 Personal history of pulmonary embolism: Secondary | ICD-10-CM

## 2021-10-16 DIAGNOSIS — D500 Iron deficiency anemia secondary to blood loss (chronic): Secondary | ICD-10-CM | POA: Diagnosis present

## 2021-10-16 DIAGNOSIS — E119 Type 2 diabetes mellitus without complications: Secondary | ICD-10-CM | POA: Insufficient documentation

## 2021-10-16 DIAGNOSIS — I5033 Acute on chronic diastolic (congestive) heart failure: Secondary | ICD-10-CM | POA: Diagnosis not present

## 2021-10-16 DIAGNOSIS — Z6841 Body Mass Index (BMI) 40.0 and over, adult: Secondary | ICD-10-CM

## 2021-10-16 DIAGNOSIS — Z8542 Personal history of malignant neoplasm of other parts of uterus: Secondary | ICD-10-CM

## 2021-10-16 DIAGNOSIS — Z8249 Family history of ischemic heart disease and other diseases of the circulatory system: Secondary | ICD-10-CM

## 2021-10-16 HISTORY — PX: CYSTOSCOPY WITH URETEROSCOPY, STONE BASKETRY AND STENT PLACEMENT: SHX6378

## 2021-10-16 LAB — PROTIME-INR
INR: 1.4 — ABNORMAL HIGH (ref 0.8–1.2)
Prothrombin Time: 16.8 seconds — ABNORMAL HIGH (ref 11.4–15.2)

## 2021-10-16 LAB — CBC WITH DIFFERENTIAL/PLATELET
Abs Immature Granulocytes: 0.06 10*3/uL (ref 0.00–0.07)
Basophils Absolute: 0 10*3/uL (ref 0.0–0.1)
Basophils Relative: 0 %
Eosinophils Absolute: 0.1 10*3/uL (ref 0.0–0.5)
Eosinophils Relative: 0 %
HCT: 26.8 % — ABNORMAL LOW (ref 36.0–46.0)
Hemoglobin: 8.1 g/dL — ABNORMAL LOW (ref 12.0–15.0)
Immature Granulocytes: 1 %
Lymphocytes Relative: 3 %
Lymphs Abs: 0.3 10*3/uL — ABNORMAL LOW (ref 0.7–4.0)
MCH: 24.7 pg — ABNORMAL LOW (ref 26.0–34.0)
MCHC: 30.2 g/dL (ref 30.0–36.0)
MCV: 81.7 fL (ref 80.0–100.0)
Monocytes Absolute: 0.4 10*3/uL (ref 0.1–1.0)
Monocytes Relative: 4 %
Neutro Abs: 11.4 10*3/uL — ABNORMAL HIGH (ref 1.7–7.7)
Neutrophils Relative %: 92 %
Platelets: 339 10*3/uL (ref 150–400)
RBC: 3.28 MIL/uL — ABNORMAL LOW (ref 3.87–5.11)
RDW: 18.5 % — ABNORMAL HIGH (ref 11.5–15.5)
WBC: 12.3 10*3/uL — ABNORMAL HIGH (ref 4.0–10.5)
nRBC: 0 % (ref 0.0–0.2)

## 2021-10-16 LAB — COMPREHENSIVE METABOLIC PANEL
ALT: 12 U/L (ref 0–44)
AST: 28 U/L (ref 15–41)
Albumin: 2.4 g/dL — ABNORMAL LOW (ref 3.5–5.0)
Alkaline Phosphatase: 57 U/L (ref 38–126)
Anion gap: 9 (ref 5–15)
BUN: 14 mg/dL (ref 8–23)
CO2: 23 mmol/L (ref 22–32)
Calcium: 8.1 mg/dL — ABNORMAL LOW (ref 8.9–10.3)
Chloride: 102 mmol/L (ref 98–111)
Creatinine, Ser: 1.3 mg/dL — ABNORMAL HIGH (ref 0.44–1.00)
GFR, Estimated: 45 mL/min — ABNORMAL LOW (ref >60)
Glucose, Bld: 106 mg/dL — ABNORMAL HIGH (ref 70–99)
Potassium: 3.6 mmol/L (ref 3.5–5.1)
Sodium: 134 mmol/L — ABNORMAL LOW (ref 135–145)
Total Bilirubin: 1.2 mg/dL (ref 0.3–1.2)
Total Protein: 6.6 g/dL (ref 6.5–8.1)

## 2021-10-16 LAB — LACTIC ACID, PLASMA: Lactic Acid, Venous: 1.9 mmol/L (ref 0.5–1.9)

## 2021-10-16 LAB — APTT: aPTT: 30 seconds (ref 24–36)

## 2021-10-16 LAB — GLUCOSE, CAPILLARY
Glucose-Capillary: 93 mg/dL (ref 70–99)
Glucose-Capillary: 96 mg/dL (ref 70–99)

## 2021-10-16 SURGERY — CYSTOSCOPY, WITH CALCULUS MANIPULATION OR REMOVAL
Anesthesia: General | Laterality: Left

## 2021-10-16 MED ORDER — OXYCODONE HCL 5 MG PO TABS: 5.0000 mg | ORAL_TABLET | Freq: Once | ORAL | Status: DC | PRN

## 2021-10-16 MED ORDER — MIDAZOLAM HCL 2 MG/2ML IJ SOLN
INTRAMUSCULAR | Status: DC | PRN
  Administered 2021-10-16: 1 mg via INTRAVENOUS

## 2021-10-16 MED ORDER — FENTANYL CITRATE (PF) 100 MCG/2ML IJ SOLN
INTRAMUSCULAR | Status: AC
  Filled 2021-10-16: qty 2

## 2021-10-16 MED ORDER — ONDANSETRON HCL 4 MG/2ML IJ SOLN: 4.0000 mg | Freq: Once | INTRAMUSCULAR | Status: DC | PRN

## 2021-10-16 MED ORDER — LACTATED RINGERS IV BOLUS (SEPSIS)
1000.0000 mL | Freq: Once | INTRAVENOUS | Status: AC
  Administered 2021-10-16: 1000 mL via INTRAVENOUS

## 2021-10-16 MED ORDER — FAMOTIDINE 20 MG PO TABS
ORAL_TABLET | ORAL | Status: AC
  Filled 2021-10-16: qty 1

## 2021-10-16 MED ORDER — ONDANSETRON HCL 4 MG/2ML IJ SOLN
INTRAMUSCULAR | Status: DC | PRN
  Administered 2021-10-16: 4 mg via INTRAVENOUS

## 2021-10-16 MED ORDER — NOREPINEPHRINE 4 MG/250ML-% IV SOLN
2.0000 ug/min | INTRAVENOUS | Status: DC
  Administered 2021-10-17: 2 ug/min via INTRAVENOUS
  Filled 2021-10-16: qty 250

## 2021-10-16 MED ORDER — LACTATED RINGERS IV BOLUS (SEPSIS)
1000.0000 mL | Freq: Once | INTRAVENOUS | Status: AC
  Administered 2021-10-17: 1000 mL via INTRAVENOUS

## 2021-10-16 MED ORDER — VANCOMYCIN HCL 500 MG/100ML IV SOLN
500.0000 mg | Freq: Once | INTRAVENOUS | Status: DC
  Filled 2021-10-16: qty 100

## 2021-10-16 MED ORDER — VANCOMYCIN HCL IN DEXTROSE 1-5 GM/200ML-% IV SOLN: 1000.0000 mg | Freq: Once | INTRAVENOUS | Status: DC

## 2021-10-16 MED ORDER — ACETAMINOPHEN 10 MG/ML IV SOLN
INTRAVENOUS | Status: AC
  Filled 2021-10-16: qty 100

## 2021-10-16 MED ORDER — PROPOFOL 10 MG/ML IV BOLUS
INTRAVENOUS | Status: DC | PRN
  Administered 2021-10-16: 100 mg via INTRAVENOUS
  Administered 2021-10-16: 50 mg via INTRAVENOUS

## 2021-10-16 MED ORDER — IOHEXOL 180 MG/ML  SOLN
INTRAMUSCULAR | Status: DC | PRN
  Administered 2021-10-16: 10 mL

## 2021-10-16 MED ORDER — METRONIDAZOLE 500 MG/100ML IV SOLN
500.0000 mg | Freq: Once | INTRAVENOUS | Status: AC
  Administered 2021-10-16: 500 mg via INTRAVENOUS
  Filled 2021-10-16: qty 100

## 2021-10-16 MED ORDER — SODIUM CHLORIDE 0.9 % IR SOLN
Status: DC | PRN
  Administered 2021-10-16: 1000 mL via INTRAVESICAL

## 2021-10-16 MED ORDER — FAMOTIDINE 20 MG PO TABS
20.0000 mg | ORAL_TABLET | Freq: Once | ORAL | Status: AC
  Administered 2021-10-16: 20 mg via ORAL

## 2021-10-16 MED ORDER — VANCOMYCIN HCL 2000 MG/400ML IV SOLN
2000.0000 mg | Freq: Once | INTRAVENOUS | Status: AC
  Administered 2021-10-17: 2000 mg via INTRAVENOUS
  Filled 2021-10-16: qty 400

## 2021-10-16 MED ORDER — PHENYLEPHRINE 40 MCG/ML (10ML) SYRINGE FOR IV PUSH (FOR BLOOD PRESSURE SUPPORT)
PREFILLED_SYRINGE | INTRAVENOUS | Status: DC | PRN
  Administered 2021-10-16 (×2): 80 ug via INTRAVENOUS

## 2021-10-16 MED ORDER — ONDANSETRON HCL 4 MG/2ML IJ SOLN
INTRAMUSCULAR | Status: AC
  Filled 2021-10-16: qty 2

## 2021-10-16 MED ORDER — LACTATED RINGERS IV SOLN: INTRAVENOUS | Status: DC

## 2021-10-16 MED ORDER — PROPOFOL 10 MG/ML IV BOLUS
INTRAVENOUS | Status: AC
  Filled 2021-10-16: qty 40

## 2021-10-16 MED ORDER — LIDOCAINE HCL (CARDIAC) PF 100 MG/5ML IV SOSY
PREFILLED_SYRINGE | INTRAVENOUS | Status: DC | PRN
  Administered 2021-10-16: 80 mg via INTRAVENOUS

## 2021-10-16 MED ORDER — MIDAZOLAM HCL 2 MG/2ML IJ SOLN
INTRAMUSCULAR | Status: AC
  Filled 2021-10-16: qty 2

## 2021-10-16 MED ORDER — LIDOCAINE HCL (PF) 2 % IJ SOLN
INTRAMUSCULAR | Status: AC
  Filled 2021-10-16: qty 5

## 2021-10-16 MED ORDER — FENTANYL CITRATE (PF) 100 MCG/2ML IJ SOLN: 25.0000 ug | INTRAMUSCULAR | Status: DC | PRN

## 2021-10-16 MED ORDER — OXYCODONE HCL 5 MG/5ML PO SOLN: 5.0000 mg | Freq: Once | ORAL | Status: DC | PRN

## 2021-10-16 MED ORDER — ACETAMINOPHEN 10 MG/ML IV SOLN: 1000.0000 mg | Freq: Once | INTRAVENOUS | Status: DC | PRN

## 2021-10-16 MED ORDER — SODIUM CHLORIDE 0.9 % IV SOLN
250.0000 mL | INTRAVENOUS | Status: DC
  Administered 2021-10-22: 250 mL via INTRAVENOUS

## 2021-10-16 MED ORDER — SODIUM CHLORIDE 0.9 % IV SOLN
2.0000 g | Freq: Once | INTRAVENOUS | Status: AC
  Administered 2021-10-16: 2 g via INTRAVENOUS
  Filled 2021-10-16: qty 12.5

## 2021-10-16 MED ORDER — FENTANYL CITRATE (PF) 100 MCG/2ML IJ SOLN
INTRAMUSCULAR | Status: DC | PRN
  Administered 2021-10-16 (×4): 25 ug via INTRAVENOUS

## 2021-10-16 MED ORDER — CHLORHEXIDINE GLUCONATE 0.12 % MT SOLN
OROMUCOSAL | Status: AC
  Filled 2021-10-16: qty 15

## 2021-10-16 MED ORDER — LACTATED RINGERS IV BOLUS (SEPSIS): 1000.0000 mL | Freq: Once | INTRAVENOUS | Status: DC

## 2021-10-16 MED ORDER — ACETAMINOPHEN 10 MG/ML IV SOLN
INTRAVENOUS | Status: DC | PRN
  Administered 2021-10-16: 1000 mg via INTRAVENOUS

## 2021-10-16 SURGICAL SUPPLY — 30 items
BAG DRAIN CYSTO-URO LG1000N (MISCELLANEOUS) ×2 IMPLANT
BASKET ZERO TIP 1.9FR (BASKET) IMPLANT
BRUSH SCRUB EZ 1% IODOPHOR (MISCELLANEOUS) ×2 IMPLANT
BSKT STON RTRVL ZERO TP 1.9FR (BASKET)
CATH URET FLEX-TIP 2 LUMEN 10F (CATHETERS) IMPLANT
CATH URETL OPEN END 6X70 (CATHETERS) IMPLANT
CNTNR SPEC 2.5X3XGRAD LEK (MISCELLANEOUS)
CONT SPEC 4OZ STER OR WHT (MISCELLANEOUS)
CONT SPEC 4OZ STRL OR WHT (MISCELLANEOUS)
CONTAINER SPEC 2.5X3XGRAD LEK (MISCELLANEOUS) IMPLANT
DRAPE UTILITY 15X26 TOWEL STRL (DRAPES) ×2 IMPLANT
GAUZE 4X4 16PLY ~~LOC~~+RFID DBL (SPONGE) ×4 IMPLANT
GLOVE SURG UNDER POLY LF SZ7.5 (GLOVE) ×2 IMPLANT
GOWN STRL REUS W/ TWL LRG LVL3 (GOWN DISPOSABLE) ×1 IMPLANT
GOWN STRL REUS W/ TWL XL LVL3 (GOWN DISPOSABLE) ×1 IMPLANT
GOWN STRL REUS W/TWL LRG LVL3 (GOWN DISPOSABLE) ×2
GOWN STRL REUS W/TWL XL LVL3 (GOWN DISPOSABLE) ×2
GUIDEWIRE STR DUAL SENSOR (WIRE) ×2 IMPLANT
INFUSOR MANOMETER BAG 3000ML (MISCELLANEOUS) ×2 IMPLANT
IV NS IRRIG 3000ML ARTHROMATIC (IV SOLUTION) ×2 IMPLANT
KIT TURNOVER CYSTO (KITS) ×2 IMPLANT
PACK CYSTO AR (MISCELLANEOUS) ×2 IMPLANT
SET CYSTO W/LG BORE CLAMP LF (SET/KITS/TRAYS/PACK) ×2 IMPLANT
SHEATH URETERAL 12FRX35CM (MISCELLANEOUS) IMPLANT
STENT URET 6FRX24 CONTOUR (STENTS) IMPLANT
STENT URET 6FRX26 CONTOUR (STENTS) IMPLANT
SURGILUBE 2OZ TUBE FLIPTOP (MISCELLANEOUS) ×2 IMPLANT
TRACTIP FLEXIVA PULSE ID 200 (Laser) ×1 IMPLANT
VALVE UROSEAL ADJ ENDO (VALVE) IMPLANT
WATER STERILE IRR 500ML POUR (IV SOLUTION) ×2 IMPLANT

## 2021-10-16 NOTE — Op Note (Signed)
Preoperative diagnosis:  ?Left distal ureteral calculus ? ?Postoperative diagnosis:  ?Passed ureteral calculus ? ?Procedure: ?Cystoscopy with stent removal ?Left ureteroscopy-diagnostic ?Left retrograde pyelogram ? ?Surgeon: Abbie Sons, MD ? ?Anesthesia: General ? ?Complications: None ? ?Intraoperative findings:  ?Cystoscopy-inflammatory changes left hemitrigone secondary to indwelling stent ?Ureteroscopy-semirigid ureteroscopy to the proximal mid ureter showed no urinary tract calculi ?Left retrograde pyelogram no filling defect.  Pullout retrograde pyelogram showed prompt emptying of the entire ureter ? ?EBL: Minimal ? ?Specimens: None ? ?Indication: Courtney Mack is a 66 y.o. female status post left ureteral stent placement 09/11/2021 for septic shock and a 2 mm calculus just proximal to the left UVJ.  She presents for follow-up ureteroscopy and stent removal.  After reviewing the management options for treatment, he elected to proceed with the above surgical procedure(s). We have discussed the potential benefits and risks of the procedure, side effects of the proposed treatment, the likelihood of the patient achieving the goals of the procedure, and any potential problems that might occur during the procedure or recuperation. Informed consent has been obtained. ? ?Description of procedure: ? ?The patient was taken to the operating room and general anesthesia was induced.  Positioning was difficult due to minimal knee flexion and minimal abduction of the right hip.  Preoperative antibiotic was administered.  A preoperative time-out was performed.  ? ?A 21 French cystoscope was lubricated and passed per urethra.  Panendoscopy was performed with findings as described above.  No solid or papillary lesions were identified.  The stent was grasped with endoscopic forceps and brought out through the urethral meatus.  A 0.038 Sensor wire was placed through the stent and advanced to the renal pelvis under  fluoroscopic guidance without difficulty.  The stent was then removed. ? ?A 4.5 French semirigid ureteroscope was passed per urethra.  The left ureteral orifice was easily engaged and the scope was able to be advanced to the proximal mid ureter and no calculus was identified.  The semirigid ureteroscope was removed and a flexible ureteroscope could not be advanced beyond the distal ureter.  Based on stone size and location it was elected not to dilate the ureter. ? ?The 4.5 French semirigid ureteroscope was placed again in to the distal ureter.  The guidewire was removed and a pullout retrograde pyelogram was performed with findings described above.  It was elected not to replace the stent. ? ?After anesthetic reversal she was transported to the PACU in stable condition. ? ?Plan: Office follow-up 1 month ? ? ? ?Abbie Sons, M.D. ? ?

## 2021-10-16 NOTE — Progress Notes (Signed)
CODE SEPSIS - PHARMACY COMMUNICATION ? ?**Broad Spectrum Antibiotics should be administered within 1 hour of Sepsis diagnosis** ? ?Time Code Sepsis Called/Page Received: 4/11 @ 2232 ? ?Antibiotics Ordered: Vanc, cefepime  ? ?Time of 1st antibiotic administration:  Cefepime 2 gm IV X 1 on 4/11 @ 2255.  ? ?Additional action taken by pharmacy:  ? ?If necessary, Name of Provider/Nurse Contacted:  ? ? ? ?Judah Carchi D ,PharmD ?Clinical Pharmacist  ?10/16/2021  11:01 PM ? ?

## 2021-10-16 NOTE — Sepsis Progress Note (Signed)
Sepsis protocol is being followed by [facility]. 

## 2021-10-16 NOTE — Progress Notes (Signed)
PHARMACY -  BRIEF ANTIBIOTIC NOTE  ? ?Pharmacy has received consult(s) for Cefepime, Vancomycin from an ED provider.  The patient's profile has been reviewed for ht/wt/allergies/indication/available labs.   ? ?One time order(s) placed for Cefepime 2 gm IV X 1 and Vancomycin 2500 mg IV X 1 ? ?Further antibiotics/pharmacy consults should be ordered by admitting physician if indicated.       ?                ?Thank you, ?Aspynn Clover D ?10/16/2021  10:38 PM ? ?

## 2021-10-16 NOTE — ED Provider Notes (Signed)
? ?[facility] ?Provider Note ? ? ? Event Date/Time  ? First MD Initiated Contact with Patient 10/16/21 2226   ?  (approximate) ? ? ?History  ? ?Code Sepsis ? ? ?HPI ? ?Courtney Mack is a 66 y.o. female  who, per discharge summary dated 09/18/21 had admission for sepsis secondary to UTI and left sided kidney stone treated with stent, who per urology op note today had stent removed with no filling defects seen, who presents to the emergency department today via EMS.  Patient states that after her stent removal earlier today she started feeling sluggish and weak.  She did have some abdominal discomfort on the left side. ? ?Physical Exam  ? ?Triage Vital Signs: ?ED Triage Vitals  ?Enc Vitals Group  ?   BP 10/16/21 2229 (!) 80/43  ?   Pulse Rate 10/16/21 2229 (!) 124  ?   Resp 10/16/21 2229 (!) 30  ?   Temp 10/16/21 2229 (!) 103.1 ?F (39.5 ?C)  ?   Temp Source 10/16/21 2229 Oral  ?   SpO2 10/16/21 2228 94 %  ?   Weight 10/16/21 2230 220 lb 0.3 oz (99.8 kg)  ?   Height 10/16/21 2230 '4\' 10"'$  (1.473 m)  ?   Head Circumference --   ?   Peak Flow --   ?   Pain Score 10/16/21 2229 2  ? ?Most recent vital signs: ?Vitals:  ? 10/16/21 2228 10/16/21 2229  ?BP:  (!) 80/43  ?Pulse:  (!) 124  ?Resp:  (!) 30  ?Temp:  (!) 103.1 ?F (39.5 ?C)  ?SpO2: 94% 94%  ? ? ?General: Awake, alert. ?CV:  Good peripheral perfusion. Tachycardia ?Resp:  Normal effort. Tachypnea ?Abd:  No distention. Tender to palpation in the left lower abdomen.  ? ? ?ED Results / Procedures / Treatments  ? ?Labs ?(all labs ordered are listed, but only abnormal results are displayed) ?Labs Reviewed  ?COMPREHENSIVE METABOLIC PANEL - Abnormal; Notable for the following components:  ?    Result Value  ? Sodium 134 (*)   ? Glucose, Bld 106 (*)   ? Creatinine, Ser 1.30 (*)   ? Calcium 8.1 (*)   ? Albumin 2.4 (*)   ? GFR, Estimated 45 (*)   ? All other components within normal limits  ?CBC WITH DIFFERENTIAL/PLATELET - Abnormal; Notable for the  following components:  ? WBC 12.3 (*)   ? RBC 3.28 (*)   ? Hemoglobin 8.1 (*)   ? HCT 26.8 (*)   ? MCH 24.7 (*)   ? RDW 18.5 (*)   ? Neutro Abs 11.4 (*)   ? Lymphs Abs 0.3 (*)   ? All other components within normal limits  ?PROTIME-INR - Abnormal; Notable for the following components:  ? Prothrombin Time 16.8 (*)   ? INR 1.4 (*)   ? All other components within normal limits  ?RESP PANEL BY RT-PCR (FLU A&B, COVID) [id number]  ?CULTURE, BLOOD (ROUTINE X 2)  ?CULTURE, BLOOD (ROUTINE X 2)  ?URINE CULTURE  ?LACTIC ACID, PLASMA  ?APTT  ?LACTIC ACID, PLASMA  ?URINALYSIS, COMPLETE (UACMP) WITH MICROSCOPIC  ? ? ? ?EKG ? ?INance Pear, attending physician, personally viewed and interpreted this EKG ? ?EKG Time: 2226 ?Rate: 124 ?Rhythm: sinus tachycardia ?Axis: normal ?Intervals: qtc 427 ?QRS: narrow, low voltage precordial leads ?ST changes: no st elevation ?Impression: abnormal ekg ? ?RADIOLOGY ?I independently interpreted and visualized the cxr. My interpretation: No pneumonia. No pneumothorax. ?Radiology  interpretation:  ?IMPRESSION:  ?No active disease.  Low lung volumes  ?   ? ? ? ? ?PROCEDURES: ? ?Critical Care performed: Yes, see critical care procedure note(s) ? ?Procedures ? ?CRITICAL CARE ?Performed by: Nance Pear ? ? ?Total critical care time: 40 minutes ? ?Critical care time was exclusive of separately billable procedures and treating other patients. ? ?Critical care was necessary to treat or prevent imminent or life-threatening deterioration. ? ?Critical care was time spent personally by me on the following activities: development of treatment plan with patient and/or surrogate as well as nursing, discussions with consultants, evaluation of patient's response to treatment, examination of patient, obtaining history from patient or surrogate, ordering and performing treatments and interventions, ordering and review of laboratory studies, ordering and review of radiographic studies, pulse oximetry and  re-evaluation of patient's condition. ? ? ?MEDICATIONS ORDERED IN ED: ?Medications  ?lactated ringers infusion (has no administration in time range)  ?lactated ringers bolus 1,000 mL (has no administration in time range)  ?  And  ?lactated ringers bolus 1,000 mL (has no administration in time range)  ?  And  ?lactated ringers bolus 1,000 mL (has no administration in time range)  ?ceFEPIme (MAXIPIME) 2 g in sodium chloride 0.9 % 100 mL IVPB (has no administration in time range)  ?metroNIDAZOLE (FLAGYL) IVPB 500 mg (has no administration in time range)  ?vancomycin (VANCOCIN) IVPB 1000 mg/200 mL premix (has no administration in time range)  ? ? ? ?IMPRESSION / MDM / ASSESSMENT AND PLAN / ED COURSE  ?I reviewed the triage vital signs and the nursing notes. ?             ?               ? ?Differential diagnosis includes, but is not limited to, sepsis secondary to pneumonia, urinary tract infection, complication of procedure performed earlier today. ? ?Patient presented to the emergency department today because of concerns for weakness, fevers.  Patient did have ureteral stent removed earlier today.  Initial vital signs are concerning for sepsis with fever, tachycardia and tachypnea as well as hypotension.  Given concern for sepsis broad-spectrum antibiotics were ordered and fluid bolus was given.  Blood work here does show a leukocytosis.  No lactic acidosis.  CT renal was obtained which is concerning for left-sided hydronephrosis and perinephric stranding.  Given concerns for possible recurrent UTI and continued obstruction I did discuss with Dr. Bernardo Heater with urology.  He will plan on coming in to evaluate patient and possible stenting.  After IV fluid bolus patient continued to be hypotensive.  Because of this peripheral pressors were ordered.  I did discuss with Rufina Falco, NP with the ICU team who will plan on admission.  Discussed findings and plan with patient. ? ? ?FINAL CLINICAL IMPRESSION(S) / ED DIAGNOSES   ? ?Final diagnoses:  ?Sepsis, due to unspecified organism, unspecified whether acute organ dysfunction present [facility])  ?Hydronephrosis, unspecified hydronephrosis type  ? ? ? ? ? ?Note:  This document was prepared using Dragon voice recognition software and may include unintentional dictation errors. ? ?  ?Nance Pear, MD ?10/17/21 0012 ? ?

## 2021-10-16 NOTE — Interval H&P Note (Signed)
History and Physical Interval Note: ? ?CV:RRR ?Lungs: clear ? ?10/16/2021 ?9:38 AM ? ?JESILYN EASOM  has presented today for surgery, with the diagnosis of Left Ureteral Stone.  The various methods of treatment have been discussed with the patient and family. After consideration of risks, benefits and other options for treatment, the patient has consented to  Procedure(s): ?CYSTOSCOPY WITH URETEROSCOPY, STONE BASKETRY AND STENT EXCHANGE OR REMOVAL (Left) as a surgical intervention.  The patient's history has been reviewed, patient examined, no change in status, stable for surgery.  I have reviewed the patient's chart and labs.  Questions were answered to the patient's satisfaction.   ? ? ?Yavonne Kiss C Carmelia Tiner ? ? ?

## 2021-10-16 NOTE — Transfer of Care (Signed)
Immediate Anesthesia Transfer of Care Note ? ?Patient: Courtney Mack ? ?Procedure(s) Performed: CYSTOSCOPY WITH URETEROSCOPY  AND STENT REMOVAL (Left) ? ?Patient Location: PACU ? ?Anesthesia Type:General ? ?Level of Consciousness: drowsy ? ?Airway & Oxygen Therapy: Patient Spontanous Breathing and Patient connected to face mask oxygen ? ?Post-op Assessment: Report given to RN, Post -op Vital signs reviewed and stable and Patient moving all extremities ? ?Post vital signs: Reviewed and stable ? ?Last Vitals:  ?Vitals Value Taken Time  ?BP 118/50 10/16/21 1045  ?Temp 36.3 ?C 10/16/21 1045  ?Pulse 80 10/16/21 1048  ?Resp 18 10/16/21 1048  ?SpO2 97 % 10/16/21 1048  ? ? ?Last Pain:  ?Vitals:  ? 10/16/21 0832  ?TempSrc: Temporal  ?PainSc: 0-No pain  ?   ? ?  ? ?Complications: No notable events documented. ?

## 2021-10-16 NOTE — ED Triage Notes (Addendum)
Pt to ED via [facility] with suspected infection Pt had outpatient surgery today to remove stent from kidney. After surgery, pt reports feeling sluggish, weakness, fever, abdominal pain. ?

## 2021-10-16 NOTE — Anesthesia Procedure Notes (Signed)
Procedure Name: LMA Insertion ?Date/Time: 10/16/2021 10:07 AM ?Performed by: Esaw Grandchild, CRNA ?Pre-anesthesia Checklist: Patient identified, Emergency Drugs available, Suction available and Patient being monitored ?Patient Re-evaluated:Patient Re-evaluated prior to induction ?Oxygen Delivery Method: Circle system utilized ?Preoxygenation: Pre-oxygenation with 100% oxygen ?Induction Type: IV induction ?Ventilation: Mask ventilation without difficulty ?LMA: LMA inserted ?LMA Size: 4.0 ?Number of attempts: 1 ?Airway Equipment and Method: Oral airway ?Placement Confirmation: positive ETCO2 and breath sounds checked- equal and bilateral ?Tube secured with: Tape ?Dental Injury: Teeth and Oropharynx as per pre-operative assessment  ? ? ? ? ?

## 2021-10-16 NOTE — Discharge Instructions (Addendum)
DISCHARGE INSTRUCTIONS FOR URETEROSCOPY ? ?MEDICATIONS:  ?1. Resume all your other meds from home.  ?2.  AZO (over-the-counter) can help with the burning/stinging when you urinate. ? ? ?ACTIVITY:  ?1. May resume regular activities in 24 hours. ?2. Drink plenty of water  ? ? ?SIGNS/SYMPTOMS TO CALL:  ?Common postoperative symptoms include urinary frequency, urgency, bladder spasm and blood in the urine ? ?Please call us if you have a fever greater than 101.5, uncontrolled nausea/vomiting, uncontrolled pain, dizziness, unable to urinate, excessively bloody urine, chest pain, shortness of breath, leg swelling, leg pain, or any other concerns or questions.  ? ?You can reach [place] at [phone].  ? ?FOLLOW-UP:  ?1. You we will be scheduled for a follow-up appointment in 1 month ? ?AMBULATORY SURGERY  ?DISCHARGE INSTRUCTIONS ? ? ?The drugs that you were given will stay in your system until tomorrow so for the next 24 hours you should not: ? ?Drive an automobile ?Make any legal decisions ?Drink any alcoholic beverage ? ? ?You may resume regular meals tomorrow.  Today it is better to start with liquids and gradually work up to solid foods. ? ?You may eat anything you prefer, but it is better to start with liquids, then soup and crackers, and gradually work up to solid foods. ? ? ?Please notify your doctor immediately if you have any unusual bleeding, trouble breathing, redness and pain at the surgery site, drainage, fever, or pain not relieved by medication. ? ? ? ?Additional Instructions: ? ? ? ? ? ? ? ?Please contact your physician with any problems or Same Day Surgery at [phone], Monday through Friday 6 am to 4 pm, or [place] [facility] at [facility] number at [phone].  ?

## 2021-10-16 NOTE — Anesthesia Preprocedure Evaluation (Signed)
Anesthesia Evaluation  ?Patient identified by MRN, date of birth, ID band ?Patient awake ? ?General Assessment Comment: ? ?Recent hospitalization for UGIB, s/p laser treatment to AVM in GI tract. ? ?Reviewed: ?Allergy & Precautions, H&P , NPO status , Patient's Chart, lab work & pertinent test results ? ?History of Anesthesia Complications ?Negative for: history of anesthetic complications ? ?Airway ?Mallampati: II ? ?TM Distance: >3 FB ?Neck ROM: full ? ? ? Dental ? ?(+) Upper Dentures, Poor Dentition,  ?  ?Pulmonary ?neg sleep apnea, neg COPD, former smoker, PE ?  ?breath sounds clear to auscultation ? ? ? ? ? ? Cardiovascular ?hypertension, Pt. on medications ?(-) angina+CHF and + DVT  ?(-) Past MI and (-) Cardiac Stents (-) dysrhythmias  ?Rhythm:regular Rate:Normal ? ?H/o CHF on chart; Lasix daily. ?Essentially normal echo (below) ? ?Echo 09/12/21: ?1. Left ventricular ejection fraction, by estimation, is 55 to 60%. The left ventricle has normal function. The left ventricle has no regional wall motion abnormalities. There is mild asymmetric left ventricular hypertrophy of the basal-septal segment. Left ventricular diastolic parameters were normal.  ??2. Right ventricular systolic function is normal. The right ventricular size is normal.  ??3. Left atrial size was mildly dilated.  ??4. The mitral valve is normal in structure. Mild mitral valve  ?regurgitation. No evidence of mitral stenosis.  ??5. The aortic valve is normal in structure. Aortic valve regurgitation is not visualized. No aortic stenosis is present.  ??6. The inferior vena cava is normal in size with greater than 50% respiratory variability, suggesting right atrial pressure of 3 mmHg.  ? ?EKG 10/04/21: NSR. Normal EKG ? ? ?HLD ?  ?Neuro/Psych ?negative neurological ROS ? negative psych ROS  ? GI/Hepatic ?negative GI ROS, Neg liver ROS, H/o GIB ? ?Presented with anemia and occult blood positive stool ? ?Suspected  UGIB ? ?S/f EGD ?  ?Endo/Other  ?diabetesObese BMI 36 ? ? Renal/GU ?  ? ?  ?Musculoskeletal ? ? Abdominal ?  ?Peds ? Hematology ? ?(+) Blood dyscrasia, anemia , Hgb 6.6 --> 7.7 s/p 2 units pRBC on 4/8 ?7.8 on 4/9 ? ?Xarelto   ?Anesthesia Other Findings ?Past Medical History: ?01/02/2021: Acute bilateral deep vein thrombosis (DVT) of femoral  ?veins ([place]) ?08/25/2020: Acute massive pulmonary embolism ([place]) ?    Comment:  Last Assessment & Plan:  Formatting of this note might  ?             be different from the original. Post vena caval filter  ?             and on xarelto and O2 ?No date: ARF (acute respiratory failure) ([place]) ?No date: Cancer [facility]) ?    Comment:  endometrial ?06/20/2021: Cellulitis of left lower leg ?No date: CHF (congestive heart failure) ([place]) ?No date: COVID-19 ?No date: Diabetes mellitus without complication ([place]) ?09/06/2020: DVT (deep venous thrombosis) ([place]) ?No date: High cholesterol ?No date: Hx of blood clots ?No date: Hyperlipidemia ?No date: Hypertension ?09/21/2020: IDA (iron deficiency anemia) ?No date: Obesity ?06/21/2021: Pressure injury of skin ?No date: Pulmonary embolism ([place]) ?No date: Sepsis ([place]) ?No date: Thrombocytopenia ([place]) ?09/13/2021: Urinary tract infection ? ?Past Surgical History: ?06/21/2021: COLONOSCOPY; N/A ?    Comment:  Procedure: COLONOSCOPY;  Surgeon: [place], Benay Pike, MD; ?             Location: [facility] ENDOSCOPY;  Service: Gastroenterology;   ?             Laterality: N/A; ?09/11/2021: CYSTOSCOPY W/  URETERAL STENT PLACEMENT; Left ?    Comment:  Procedure: CYSTOSCOPY WITH RETROGRADE PYELOGRAM/URETERAL ?             STENT PLACEMENT;  Surgeon: Abbie Sons, MD;   ?             Location: [facility] ORS;  Service: Urology;  Laterality: Left; ?08/17/2021: ENTEROSCOPY; N/A ?    Comment:  Procedure: ENTEROSCOPY;  Surgeon: Lin Landsman,  ?             MD;  Location: [facility] ENDOSCOPY;  Service:  ?             Gastroenterology;  Laterality: N/A; ?06/21/2021:  ESOPHAGOGASTRODUODENOSCOPY; N/A ?    Comment:  Procedure: ESOPHAGOGASTRODUODENOSCOPY (EGD);  Surgeon:  ?             Efrain Sella, MD;  Location: [facility]COPY;   ?             Service: Gastroenterology;  Laterality: N/A; ?08/17/2021: ESOPHAGOGASTRODUODENOSCOPY (EGD) WITH PROPOFOL; N/A ?    Comment:  Procedure: ESOPHAGOGASTRODUODENOSCOPY (EGD) WITH  ?             PROPOFOL;  Surgeon: Lin Landsman, MD;  Location:  ?             [facility] ENDOSCOPY;  Service: Gastroenterology;  Laterality:  ?             N/A; ?06/22/2021: GIVENS CAPSULE STUDY; N/A ?    Comment:  Procedure: GIVENS CAPSULE STUDY;  Surgeon: [place],  ?             Benay Pike, MD;  Location: [facility] ENDOSCOPY;  Service:  ?             Gastroenterology;  Laterality: N/A; ?08/18/2020: IVC FILTER INSERTION; N/A ?    Comment:  Procedure: IVC FILTER INSERTION;  Surgeon: Delana Meyer,  ?             Dolores Lory, MD;  Location: [place] CV LAB;  Service: ?             Cardiovascular;  Laterality: N/A; ?01/03/2021: PERIPHERAL VASCULAR THROMBECTOMY; Bilateral ?    Comment:  Procedure: PERIPHERAL VASCULAR THROMBECTOMY;  Surgeon:  ?             Algernon Huxley, MD;  Location: [place] CV LAB;   ?             Service: Cardiovascular;  Laterality: Bilateral; ?08/18/2020: PULMONARY THROMBECTOMY; N/A ?    Comment:  Procedure: PULMONARY THROMBECTOMY;  Surgeon: Delana Meyer,  ?             Dolores Lory, MD;  Location: [place] CV LAB;  Service: ?             Cardiovascular;  Laterality: N/A; ?No date: TUBAL LIGATION ?07/18/2021: VISCERAL ANGIOGRAPHY; N/A ?    Comment:  Procedure: VISCERAL ANGIOGRAPHY;  Surgeon: Algernon Huxley, ?             MD;  Location: [place] CV LAB;  Service:  ?             Cardiovascular;  Laterality: N/A; ? ?BMI   ? Body Mass Index: 35.95 kg/m?  ?  ? ? Reproductive/Obstetrics ?negative OB ROS ? ?  ? ? ? ? ? ? ? ? ? ? ? ? ? ?  ?  ? ? ? ? ? ? ? ? ?Anesthesia Physical ? ?Anesthesia Plan ? ?  ASA: 3 ? ?Anesthesia Plan: General  ? ?Post-op Pain Management:  Ofirmev IV (intra-op)*  ? ?Induction: Intravenous ? ?PONV Risk Score and Plan: 4 or greater and Treatment may vary due to age or medical condition, Ondansetron, Dexamethasone and Midazolam ? ?Airway Management Planned: LMA and Oral ETT ? ?Additional Equipment: None ? ?Intra-op Plan:  ? ?Post-operative Plan: Extubation in OR ? ?Informed Consent: I have reviewed the patients History and Physical, chart, labs and discussed the procedure including the risks, benefits and alternatives for the proposed anesthesia with the patient or authorized representative who has indicated his/her understanding and acceptance.  ? ? ? ?Dental Advisory Given ? ?Plan Discussed with: Anesthesiologist, CRNA and Surgeon ? ?Anesthesia Plan Comments: (Discussed risks of anesthesia with patient, including PONV, sore throat, lip/dental/eye damage. Rare risks discussed as well, such as cardiorespiratory and neurological sequelae, and allergic reactions. Discussed the role of CRNA in patient's perioperative care. Patient understands.)  ? ? ? ? ? ? ?Anesthesia Quick Evaluation ? ? ?66 year old female, history of CHF, recent admission to the hospital for septic shock and history of pulmonary emboli on Xarelto, history of GI bleeds requiring multiple transfusions in the past.  Presented after routine pre-op labs (for cysto) showed anemia, found to have occult positive stool, suspected UGIB. ? ? ?Hgb 7.8 on 4/9 ?IVGA native airway. NPO appropriate. Denies N/V. GETA backup ?Standard monitors ? ?Risks/benefits discussed and pt agrees to proceed with anesthetic. ? ?K Ramsdell ?

## 2021-10-16 NOTE — Anesthesia Postprocedure Evaluation (Signed)
Anesthesia Post Note ? ?Patient: Courtney Mack ? ?Procedure(s) Performed: CYSTOSCOPY WITH URETEROSCOPY  AND STENT REMOVAL (Left) ? ?Patient location during evaluation: PACU ?Anesthesia Type: General ?Level of consciousness: awake and alert ?Pain management: pain level controlled ?Vital Signs Assessment: post-procedure vital signs reviewed and stable ?Respiratory status: spontaneous breathing, nonlabored ventilation, respiratory function stable and patient connected to nasal cannula oxygen ?Cardiovascular status: blood pressure returned to baseline and stable ?Postop Assessment: no apparent nausea or vomiting ?Anesthetic complications: no ? ? ?No notable events documented. ? ? ?Last Vitals:  ?Vitals:  ? 10/16/21 1124 10/16/21 1134  ?BP: (!) 124/59 (!) 131/55  ?Pulse: 76 86  ?Resp: 18 18  ?Temp: 36.7 ?C 36.6 ?C  ?SpO2: 96% 96%  ?  ?Last Pain:  ?Vitals:  ? 10/16/21 1134  ?TempSrc: Temporal  ?PainSc: 0-No pain  ? ? ?  ?  ?  ?  ?  ?  ? ?Arita Miss ? ? ? ? ?

## 2021-10-17 ENCOUNTER — Encounter
Admission: EM | Disposition: A | Discharge status: Home Health | Payer: Self-pay | Source: Home / Self Care | Attending: Internal Medicine

## 2021-10-17 ENCOUNTER — Inpatient Hospital Stay: Payer: Medicare (Managed Care) | Admitting: Anesthesiology

## 2021-10-17 ENCOUNTER — Encounter: Payer: Self-pay | Admitting: Pulmonary Disease

## 2021-10-17 ENCOUNTER — Inpatient Hospital Stay: Payer: Medicare (Managed Care)

## 2021-10-17 DIAGNOSIS — Z8616 Personal history of COVID-19: Secondary | ICD-10-CM | POA: Diagnosis not present

## 2021-10-17 DIAGNOSIS — Z86718 Personal history of other venous thrombosis and embolism: Secondary | ICD-10-CM | POA: Diagnosis not present

## 2021-10-17 DIAGNOSIS — Z86711 Personal history of pulmonary embolism: Secondary | ICD-10-CM | POA: Diagnosis not present

## 2021-10-17 DIAGNOSIS — E119 Type 2 diabetes mellitus without complications: Secondary | ICD-10-CM | POA: Diagnosis present

## 2021-10-17 DIAGNOSIS — N202 Calculus of kidney with calculus of ureter: Secondary | ICD-10-CM | POA: Diagnosis present

## 2021-10-17 DIAGNOSIS — Z6841 Body Mass Index (BMI) 40.0 and over, adult: Secondary | ICD-10-CM | POA: Diagnosis not present

## 2021-10-17 DIAGNOSIS — N134 Hydroureter: Secondary | ICD-10-CM | POA: Diagnosis not present

## 2021-10-17 DIAGNOSIS — D500 Iron deficiency anemia secondary to blood loss (chronic): Secondary | ICD-10-CM | POA: Diagnosis present

## 2021-10-17 DIAGNOSIS — N1330 Unspecified hydronephrosis: Secondary | ICD-10-CM | POA: Diagnosis not present

## 2021-10-17 DIAGNOSIS — N136 Pyonephrosis: Secondary | ICD-10-CM | POA: Diagnosis present

## 2021-10-17 DIAGNOSIS — I5032 Chronic diastolic (congestive) heart failure: Secondary | ICD-10-CM | POA: Diagnosis not present

## 2021-10-17 DIAGNOSIS — I110 Hypertensive heart disease with heart failure: Secondary | ICD-10-CM | POA: Diagnosis present

## 2021-10-17 DIAGNOSIS — Z8249 Family history of ischemic heart disease and other diseases of the circulatory system: Secondary | ICD-10-CM | POA: Diagnosis not present

## 2021-10-17 DIAGNOSIS — B3781 Candidal esophagitis: Secondary | ICD-10-CM | POA: Diagnosis not present

## 2021-10-17 DIAGNOSIS — Z87891 Personal history of nicotine dependence: Secondary | ICD-10-CM | POA: Diagnosis not present

## 2021-10-17 DIAGNOSIS — I959 Hypotension, unspecified: Secondary | ICD-10-CM | POA: Diagnosis not present

## 2021-10-17 DIAGNOSIS — R0902 Hypoxemia: Secondary | ICD-10-CM | POA: Diagnosis present

## 2021-10-17 DIAGNOSIS — J9601 Acute respiratory failure with hypoxia: Secondary | ICD-10-CM | POA: Diagnosis not present

## 2021-10-17 DIAGNOSIS — R6521 Severe sepsis with septic shock: Secondary | ICD-10-CM

## 2021-10-17 DIAGNOSIS — I5033 Acute on chronic diastolic (congestive) heart failure: Secondary | ICD-10-CM | POA: Diagnosis not present

## 2021-10-17 DIAGNOSIS — R319 Hematuria, unspecified: Secondary | ICD-10-CM | POA: Diagnosis not present

## 2021-10-17 DIAGNOSIS — Z833 Family history of diabetes mellitus: Secondary | ICD-10-CM | POA: Diagnosis not present

## 2021-10-17 DIAGNOSIS — A419 Sepsis, unspecified organism: Secondary | ICD-10-CM | POA: Diagnosis present

## 2021-10-17 DIAGNOSIS — N170 Acute kidney failure with tubular necrosis: Secondary | ICD-10-CM | POA: Diagnosis present

## 2021-10-17 DIAGNOSIS — Y831 Surgical operation with implant of artificial internal device as the cause of abnormal reaction of the patient, or of later complication, without mention of misadventure at the time of the procedure: Secondary | ICD-10-CM | POA: Diagnosis present

## 2021-10-17 DIAGNOSIS — Z8542 Personal history of malignant neoplasm of other parts of uterus: Secondary | ICD-10-CM | POA: Diagnosis not present

## 2021-10-17 DIAGNOSIS — T83592A Infection and inflammatory reaction due to indwelling ureteral stent, initial encounter: Secondary | ICD-10-CM | POA: Diagnosis present

## 2021-10-17 DIAGNOSIS — E785 Hyperlipidemia, unspecified: Secondary | ICD-10-CM | POA: Diagnosis present

## 2021-10-17 DIAGNOSIS — Z83438 Family history of other disorder of lipoprotein metabolism and other lipidemia: Secondary | ICD-10-CM | POA: Diagnosis not present

## 2021-10-17 HISTORY — PX: CYSTOSCOPY WITH STENT PLACEMENT: SHX5790

## 2021-10-17 LAB — MAGNESIUM: Magnesium: 1.3 mg/dL — ABNORMAL LOW (ref 1.7–2.4)

## 2021-10-17 LAB — URINALYSIS, COMPLETE (UACMP) WITH MICROSCOPIC
Bilirubin Urine: NEGATIVE
Glucose, UA: NEGATIVE mg/dL
Ketones, ur: NEGATIVE mg/dL
Nitrite: NEGATIVE
Protein, ur: 100 mg/dL — AB
Specific Gravity, Urine: 1.02 (ref 1.005–1.030)
WBC, UA: >50 WBC/hpf — ABNORMAL HIGH (ref 0–5)
pH: 5 (ref 5.0–8.0)

## 2021-10-17 LAB — GLUCOSE, CAPILLARY
Glucose-Capillary: 93 mg/dL (ref 70–99)
Glucose-Capillary: 88 mg/dL (ref 70–99)
Glucose-Capillary: 82 mg/dL (ref 70–99)
Glucose-Capillary: 72 mg/dL (ref 70–99)
Glucose-Capillary: 106 mg/dL — ABNORMAL HIGH (ref 70–99)
Glucose-Capillary: 126 mg/dL — ABNORMAL HIGH (ref 70–99)
Glucose-Capillary: 143 mg/dL — ABNORMAL HIGH (ref 70–99)

## 2021-10-17 LAB — LACTIC ACID, PLASMA
Lactic Acid, Venous: 1.6 mmol/L (ref 0.5–1.9)
Lactic Acid, Venous: 1.4 mmol/L (ref 0.5–1.9)
Lactic Acid, Venous: 1.1 mmol/L (ref 0.5–1.9)

## 2021-10-17 LAB — RESP PANEL BY RT-PCR (FLU A&B, COVID) ARPGX2
Influenza A by PCR: NEGATIVE
Influenza B by PCR: NEGATIVE
SARS Coronavirus 2 by RT PCR: NEGATIVE

## 2021-10-17 LAB — CBC
HCT: 27.2 % — ABNORMAL LOW (ref 36.0–46.0)
Hemoglobin: 8.1 g/dL — ABNORMAL LOW (ref 12.0–15.0)
MCH: 24.8 pg — ABNORMAL LOW (ref 26.0–34.0)
MCHC: 29.8 g/dL — ABNORMAL LOW (ref 30.0–36.0)
MCV: 83.2 fL (ref 80.0–100.0)
Platelets: 327 10*3/uL (ref 150–400)
RBC: 3.27 MIL/uL — ABNORMAL LOW (ref 3.87–5.11)
RDW: 18.2 % — ABNORMAL HIGH (ref 11.5–15.5)
WBC: 19.9 10*3/uL — ABNORMAL HIGH (ref 4.0–10.5)
nRBC: 0 % (ref 0.0–0.2)

## 2021-10-17 LAB — BRAIN NATRIURETIC PEPTIDE: B Natriuretic Peptide: 927.6 pg/mL — ABNORMAL HIGH (ref 0.0–100.0)

## 2021-10-17 LAB — BASIC METABOLIC PANEL
Anion gap: 9 (ref 5–15)
BUN: 16 mg/dL (ref 8–23)
CO2: 24 mmol/L (ref 22–32)
Calcium: 7.9 mg/dL — ABNORMAL LOW (ref 8.9–10.3)
Chloride: 103 mmol/L (ref 98–111)
Creatinine, Ser: 1.4 mg/dL — ABNORMAL HIGH (ref 0.44–1.00)
GFR, Estimated: 41 mL/min — ABNORMAL LOW (ref >60)
Glucose, Bld: 97 mg/dL (ref 70–99)
Potassium: 4.3 mmol/L (ref 3.5–5.1)
Sodium: 136 mmol/L (ref 135–145)

## 2021-10-17 LAB — HEMOGLOBIN A1C
Hgb A1c MFr Bld: 5.3 % (ref 4.8–5.6)
Mean Plasma Glucose: 105.41 mg/dL

## 2021-10-17 LAB — PHOSPHORUS: Phosphorus: 4.2 mg/dL (ref 2.5–4.6)

## 2021-10-17 LAB — PROCALCITONIN: Procalcitonin: 74 ng/mL

## 2021-10-17 SURGERY — CYSTOSCOPY, WITH STENT INSERTION
Anesthesia: General | Site: Ureter | Laterality: Left

## 2021-10-17 MED ORDER — FENTANYL CITRATE (PF) 100 MCG/2ML IJ SOLN
INTRAMUSCULAR | Status: DC | PRN
  Administered 2021-10-17: 25 ug via INTRAVENOUS
  Administered 2021-10-17: 50 ug via INTRAVENOUS
  Administered 2021-10-17: 25 ug via INTRAVENOUS

## 2021-10-17 MED ORDER — ONDANSETRON HCL 4 MG/2ML IJ SOLN: 4.0000 mg | Freq: Once | INTRAMUSCULAR | Status: DC | PRN

## 2021-10-17 MED ORDER — ENOXAPARIN SODIUM 60 MG/0.6ML IJ SOSY
0.5000 mg/kg | PREFILLED_SYRINGE | INTRAMUSCULAR | Status: DC
  Administered 2021-10-17: 50 mg via SUBCUTANEOUS
  Filled 2021-10-17 (×2): qty 0.5

## 2021-10-17 MED ORDER — FENTANYL CITRATE (PF) 100 MCG/2ML IJ SOLN
INTRAMUSCULAR | Status: AC
  Filled 2021-10-17: qty 2

## 2021-10-17 MED ORDER — ADULT MULTIVITAMIN W/MINERALS CH
1.0000 | ORAL_TABLET | Freq: Every day | ORAL | Status: DC
  Administered 2021-10-18 – 2021-10-23 (×6): 1 via ORAL
  Filled 2021-10-17 (×6): qty 1

## 2021-10-17 MED ORDER — INSULIN ASPART 100 UNIT/ML IJ SOLN: 0.0000 [IU] | INTRAMUSCULAR | Status: DC

## 2021-10-17 MED ORDER — PHENYLEPHRINE HCL-NACL 20-0.9 MG/250ML-% IV SOLN
INTRAVENOUS | Status: AC
  Filled 2021-10-17: qty 250

## 2021-10-17 MED ORDER — IOHEXOL 180 MG/ML  SOLN
INTRAMUSCULAR | Status: DC | PRN
  Administered 2021-10-17: 10 mL

## 2021-10-17 MED ORDER — SODIUM CHLORIDE 0.9 % IR SOLN
Status: DC | PRN
  Administered 2021-10-17: 3300 mL

## 2021-10-17 MED ORDER — MIDAZOLAM HCL 2 MG/2ML IJ SOLN
INTRAMUSCULAR | Status: DC | PRN
Start: 2021-10-17 — End: 2021-10-17
  Administered 2021-10-17: .5 mg via INTRAVENOUS

## 2021-10-17 MED ORDER — NOREPINEPHRINE 16 MG/250ML-% IV SOLN
0.0000 ug/min | INTRAVENOUS | Status: DC
  Administered 2021-10-17: 10 ug/min via INTRAVENOUS
  Administered 2021-10-18: 4 ug/min via INTRAVENOUS
  Filled 2021-10-17 (×2): qty 250

## 2021-10-17 MED ORDER — CHLORHEXIDINE GLUCONATE CLOTH 2 % EX PADS
6.0000 | MEDICATED_PAD | Freq: Every day | CUTANEOUS | Status: DC
  Administered 2021-10-17 – 2021-10-23 (×7): 6 via TOPICAL

## 2021-10-17 MED ORDER — POLYETHYLENE GLYCOL 3350 17 G PO PACK: 17.0000 g | PACK | Freq: Every day | ORAL | Status: DC | PRN

## 2021-10-17 MED ORDER — ACETAMINOPHEN 325 MG PO TABS
650.0000 mg | ORAL_TABLET | Freq: Four times a day (QID) | ORAL | Status: DC | PRN
  Administered 2021-10-17: 650 mg via ORAL
  Filled 2021-10-17 (×2): qty 2

## 2021-10-17 MED ORDER — DOCUSATE SODIUM 100 MG PO CAPS: 100.0000 mg | ORAL_CAPSULE | Freq: Two times a day (BID) | ORAL | Status: DC | PRN

## 2021-10-17 MED ORDER — PROPOFOL 500 MG/50ML IV EMUL
INTRAVENOUS | Status: AC
  Filled 2021-10-17: qty 50

## 2021-10-17 MED ORDER — ONDANSETRON HCL 4 MG/2ML IJ SOLN: 4.0000 mg | Freq: Four times a day (QID) | INTRAMUSCULAR | Status: DC | PRN

## 2021-10-17 MED ORDER — OXYCODONE HCL 5 MG/5ML PO SOLN: 5.0000 mg | Freq: Once | ORAL | Status: DC | PRN

## 2021-10-17 MED ORDER — ETOMIDATE 2 MG/ML IV SOLN
INTRAVENOUS | Status: AC
  Filled 2021-10-17: qty 10

## 2021-10-17 MED ORDER — MIDAZOLAM HCL 2 MG/2ML IJ SOLN
INTRAMUSCULAR | Status: AC
  Filled 2021-10-17: qty 2

## 2021-10-17 MED ORDER — LACTATED RINGERS IV SOLN: INTRAVENOUS | Status: DC | PRN

## 2021-10-17 MED ORDER — ENSURE ENLIVE PO LIQD
237.0000 mL | Freq: Three times a day (TID) | ORAL | Status: DC
  Administered 2021-10-17 – 2021-10-23 (×12): 237 mL via ORAL

## 2021-10-17 MED ORDER — MAGNESIUM SULFATE 4 GM/100ML IV SOLN
4.0000 g | Freq: Once | INTRAVENOUS | Status: AC
  Administered 2021-10-17: 4 g via INTRAVENOUS
  Filled 2021-10-17: qty 100

## 2021-10-17 MED ORDER — INSULIN ASPART 100 UNIT/ML IJ SOLN: 0.0000 [IU] | Freq: Every day | INTRAMUSCULAR | Status: DC

## 2021-10-17 MED ORDER — FENTANYL CITRATE (PF) 100 MCG/2ML IJ SOLN: 25.0000 ug | INTRAMUSCULAR | Status: DC | PRN

## 2021-10-17 MED ORDER — SODIUM CHLORIDE 0.9 % IV SOLN
2.0000 g | INTRAVENOUS | Status: DC
  Administered 2021-10-17 – 2021-10-19 (×3): 2 g via INTRAVENOUS
  Filled 2021-10-17 (×3): qty 2

## 2021-10-17 MED ORDER — OXYCODONE HCL 5 MG PO TABS: 5.0000 mg | ORAL_TABLET | Freq: Once | ORAL | Status: DC | PRN

## 2021-10-17 MED ORDER — ONDANSETRON HCL 4 MG/2ML IJ SOLN
INTRAMUSCULAR | Status: AC
  Administered 2021-10-17: 4 mg via INTRAVENOUS
  Filled 2021-10-17: qty 2

## 2021-10-17 MED ORDER — INSULIN ASPART 100 UNIT/ML IJ SOLN
0.0000 [IU] | Freq: Three times a day (TID) | INTRAMUSCULAR | Status: DC
  Administered 2021-10-17 – 2021-10-18 (×3): 1 [IU] via SUBCUTANEOUS
  Administered 2021-10-18: 2 [IU] via SUBCUTANEOUS
  Administered 2021-10-20 (×2): 1 [IU] via SUBCUTANEOUS
  Administered 2021-10-21: 2 [IU] via SUBCUTANEOUS
  Administered 2021-10-22 – 2021-10-23 (×2): 1 [IU] via SUBCUTANEOUS
  Filled 2021-10-17 (×9): qty 1

## 2021-10-17 MED ORDER — LACTATED RINGERS IV SOLN: INTRAVENOUS | Status: DC

## 2021-10-17 MED ORDER — ACETAMINOPHEN 10 MG/ML IV SOLN: 1000.0000 mg | Freq: Once | INTRAVENOUS | Status: DC | PRN

## 2021-10-17 SURGICAL SUPPLY — 18 items
BAG DRAIN CYSTO-URO LG1000N (MISCELLANEOUS) ×2 IMPLANT
BRUSH SCRUB EZ 1% IODOPHOR (MISCELLANEOUS) ×2 IMPLANT
CATH URETL OPEN 5X70 (CATHETERS) ×1 IMPLANT
GAUZE 4X4 16PLY ~~LOC~~+RFID DBL (SPONGE) ×3 IMPLANT
GLOVE SURG UNDER POLY LF SZ7.5 (GLOVE) ×2 IMPLANT
GOWN STRL REUS W/ TWL XL LVL3 (GOWN DISPOSABLE) ×1 IMPLANT
GOWN STRL REUS W/TWL XL LVL3 (GOWN DISPOSABLE) ×2
GUIDEWIRE STR DUAL SENSOR (WIRE) ×2 IMPLANT
IV NS IRRIG 3000ML ARTHROMATIC (IV SOLUTION) ×3 IMPLANT
KIT TURNOVER CYSTO (KITS) ×2 IMPLANT
MANIFOLD NEPTUNE II (INSTRUMENTS) ×2 IMPLANT
PACK CYSTO AR (MISCELLANEOUS) ×2 IMPLANT
SET CYSTO W/LG BORE CLAMP LF (SET/KITS/TRAYS/PACK) ×2 IMPLANT
STENT URET 6FRX24 CONTOUR (STENTS) ×1 IMPLANT
STENT URET 6FRX26 CONTOUR (STENTS) IMPLANT
SURGILUBE 2OZ TUBE FLIPTOP (MISCELLANEOUS) ×2 IMPLANT
WATER STERILE IRR 1000ML POUR (IV SOLUTION) ×2 IMPLANT
WATER STERILE IRR 500ML POUR (IV SOLUTION) ×2 IMPLANT

## 2021-10-17 NOTE — Progress Notes (Signed)
Initial Nutrition Assessment ? ?DOCUMENTATION CODES:  ? ?Morbid obesity ? ?INTERVENTION:  ? ?Ensure Enlive po TID, each supplement provides 350 kcal and 20 grams of protein. ? ?MVI po daily  ? ?Liberalize diet  ? ?Pt at moderate refeed risk; recommend monitor potassium, magnesium and phosphorus labs daily until stable ? ?NUTRITION DIAGNOSIS:  ? ?Inadequate oral intake related to acute illness as evidenced by per patient/family report. ? ?GOAL:  ? ?Patient will meet greater than or equal to 90% of their needs ? ?MONITOR:  ? ?PO intake, Supplement acceptance, Weight trends, Labs, Skin, I & O's ? ?REASON FOR ASSESSMENT:  ? ?Malnutrition Screening Tool ?  ? ?ASSESSMENT:  ? ?66 y.o. female with medical history significant of DVT/PE, hypertension, hyperlipidemia, diabetes mellitus, anemia, obesity, endometrial cancer, dCHF, morbid obesity, cellulitis, sepsis, anemia, GI bleed at splenic flexure s/p endovascular embolization on 1/11 and COVID 19 (feb) who is admitted with urosepsis secondary to ureteral stone now s/p stent placement 4/12. ? ?Pt s/p EGD 4/9; pt noted to have candidiasis and gastric antral vascular ectasia ? ?Met with pt in room today. Pt is well known to this RD from numerous previous admissions. Pt reports poor appetite and oral intake for several days pta. Pt reports that her appetite remains poor in hospital; pt reports eating only bites for breakfast this morning. Pt previously lost 16lbs(7%) during having COVID in February. Per chart, pt appears to be back up ~17lbs since having COVID but RD unsure how accurate pt's weight is as pt with severe edema in her LE. RD discussed with pt the importance of adequate nutrition needed to preserve lean muscle. Pt is willing to drink chocolate or strawberry Ensure in hospital. RD will add supplements and MVI to help pt meet her estimated needs. Pt is likely at refeed risk.  ? ?Medications reviewed and include: lovenox, insulin, ceftriaxone, LRS @100ml /hr, levophed,  vancomycin  ? ?Labs reviewed: K 4.3 wnl, creat 1.40(H), P 4.2 wnl, Mg 1.3(L) ?Wbc- 19.9(H), Hgb 8.1(L), Hct 27.2(L), MCH 24.8(L), MCHC 29.8(L) ?cbgs- 106, 72, 82, 88 X 24 hrs ?AIC 5.3- 4/12 ? ?NUTRITION - FOCUSED PHYSICAL EXAM: ? ?Flowsheet Row Most Recent Value  ?Orbital Region No depletion  ?Upper Arm Region Moderate depletion  ?Thoracic and Lumbar Region No depletion  ?Buccal Region No depletion  ?Temple Region Moderate depletion  ?Clavicle Bone Region No depletion  ?Clavicle and Acromion Bone Region No depletion  ?Scapular Bone Region No depletion  ?Dorsal Hand No depletion  ?Patellar Region Unable to assess  ?Anterior Thigh Region Unable to assess  ?Posterior Calf Region Unable to assess  ?Edema (RD Assessment) Severe  ?Hair Reviewed  ?Eyes Reviewed  ?Mouth Reviewed  ?Skin Reviewed  ?Nails Reviewed  ? ?Diet Order:   ?Diet Order   ? ?       ?  Diet heart healthy/carb modified Room service appropriate? Yes; Fluid consistency: Thin  Diet effective now       ?  ? ?  ?  ? ?  ? ?EDUCATION NEEDS:  ? ?Education needs have been addressed ? ?Skin:  Skin Assessment: Reviewed RN Assessment (incision vagina) ? ?Last BM:  4/10- type 5 ? ?Height:  ? ?Ht Readings from Last 1 Encounters:  ?10/16/21 4' 10"  (1.473 m)  ? ? ?Weight:  ? ?Wt Readings from Last 1 Encounters:  ?10/16/21 99.8 kg  ? ? ?Ideal Body Weight:  44.5 kg ? ?BMI:  Body mass index is 45.98 kg/m?. ? ?Estimated Nutritional Needs:  ? ?Kcal:  1900-2200kcal/day ? ?Protein:  95-110g/day ? ?Fluid:  1.4-1.6L/day ? ?Koleen Distance MS, RD, LDN ?Please refer to [facility] for RD and/or RD on-call/weekend/after hours pager ? ?

## 2021-10-17 NOTE — Progress Notes (Signed)
Tylenol '650mg'$  PO given for oral temperature of 102.9 ?

## 2021-10-17 NOTE — Progress Notes (Signed)
Zofran 4mg IV given for nausea.

## 2021-10-17 NOTE — Progress Notes (Signed)
Irrigated 16 fr foley per policy and procedure.  When irrigating foley patient complained of discomfort and urine and irrigation solution leaked around foley onto bed.  Per order Foley removed and to be replaced with 75f foley.  ?

## 2021-10-17 NOTE — Anesthesia Preprocedure Evaluation (Addendum)
Anesthesia Evaluation  ?Patient identified by MRN, date of birth, ID band ?Patient awake ? ? ? ?Reviewed: ?Allergy & Precautions, NPO status , Patient's Chart, lab work & pertinent test results ? ?History of Anesthesia Complications ?Negative for: history of anesthetic complications ? ?Airway ?Mallampati: III ? ? ? ? ? ? Dental ? ?(+) Upper Dentures, Poor Dentition, Missing ?  ?Pulmonary ? ?Chronic cough ?  ?Pulmonary exam normal ?breath sounds clear to auscultation ? ? ? ? ? ? Cardiovascular ?hypertension, +CHF  ? ?Rhythm:Regular Rate:Tachycardia ? ?Hx DVT and PE on Xarelto ? ?ECG 10/16/21: ST (HR 124); QTC 427; low voltage precordial leads; no st elevation ? ?Echo 09/12/21: ?1. Left ventricular ejection fraction, by estimation, is 55 to 60%. The left ventricle has normal function. The left ventricle has no regional wall motion abnormalities. There is mild asymmetric left ventricular hypertrophy of the basal-septal segment.  ?Left ventricular diastolic parameters were normal.  ??2. Right ventricular systolic function is normal. The right ventricular size is normal.  ?  ?Neuro/Psych ?negative neurological ROS ?   ? GI/Hepatic ?PUD, Recurrent GI bleed s/p transfusions last admission 4/7-4/9 ?  ?Endo/Other  ?diabetes, Type 2Class 3 obesity ? Renal/GU ?Renal disease (nephrolithiasis s/p stent 10/16/21)  ? ?  ?Musculoskeletal ? ? Abdominal ?  ?Peds ? Hematology ? ?(+) Blood dyscrasia, anemia ,   ?Anesthesia Other Findings ?Presenting with sepsis in ER via EMS after feeling weak and sluggish s/p cysto with stent placement on 10/16/21. ? ?Cardiology note 10/04/21:  ?Preop cardiovascular evaluation ?Acceptable risk for urologic procedure scheduled in April 2023 ?Recommend she hold Xarelto 3 days prior to procedure ?Restart Xarelto once cleared by surgery ?? ?History of DVT, PE ?Has had a history of recurrent DVT PE, will need indefinite anticoagulation ?Likely exacerbated by immobility,  lymphedema, prior history of DVT and PE ?She is on Xarelto ?? ?History of GI bleed, chronic anemia ?Has had numerous EGD, colonoscopy procedures, followed by GI ?Has had numerous transfusions in the past ?Has chronic anemia which will exacerbate CHF symptoms ?Daughter concerned that nobody is monitoring blood count ?She does not have regular follow-up with primary care or GI ?Recommend she seek consultation with hematology for close monitoring, may need iron, periodic transfusion ?? ?Morbid obesity ?Calorie restriction recommended, unable to exercise secondary to immobility ?? ?CHF, diastolic ?Normal ejection fraction on recent echo March 2023 ?Hospital records reviewed, notes indicating 20 L diuresis in March 2023 ?Recommend she moderate her fluid intake, remain on Lasix 20 daily ? Reproductive/Obstetrics ?Uterine CA ? ?  ? ? ? ? ? ? ? ? ? ? ? ? ? ?  ?  ? ? ? ? ? ? ? ?Anesthesia Physical ?Anesthesia Plan ? ?ASA: 4 and emergent ? ?Anesthesia Plan: MAC  ? ?Post-op Pain Management:   ? ?Induction: Intravenous ? ?PONV Risk Score and Plan: 2 and TIVA, Treatment may vary due to age or medical condition and Midazolam ? ?Airway Management Planned: Natural Airway ? ?Additional Equipment:  ? ?Intra-op Plan:  ? ?Post-operative Plan: Possible Post-op intubation/ventilation ? ?Informed Consent: I have reviewed the patients History and Physical, chart, labs and discussed the procedure including the risks, benefits and alternatives for the proposed anesthesia with the patient or authorized representative who has indicated his/her understanding and acceptance.  ? ? ? ? ? ?Plan Discussed with: CRNA ? ?Anesthesia Plan Comments: (Patient is septic with systolic Bps 16X-09U, HR 045W, currently on norepinephrine infusion at 7 mcg/min.  Getting central line in ICU preop.  Appropriately NPO.  Plan for minimal anesthesia.  If intubation needed, plan to leave intubated postop and transport back to ICU.  GETA backup discussed.  Patient  consented for risks of anesthesia including but not limited to:  ?- adverse reactions to medications ?- damage to eyes, teeth, lips or other oral mucosa ?- nerve damage due to positioning  ?- sore throat or hoarseness ?- damage to heart, brain, nerves, lungs, other parts of body or loss of life ? ?Informed patient about role of CRNA in peri- and intra-operative care.  Patient voiced understanding.)  ? ? ? ? ? ?Anesthesia Quick Evaluation ? ?

## 2021-10-17 NOTE — Progress Notes (Signed)
Urology Consult Follow Up ? ?Subjective: ?S/P left ureteral stent placement  ? ?Patient resting in bed on nasal cannula.  She is not experiencing pain at this time.  Foley in place draining clear yellow urine. ? ?Febrile, tachycardiac with soft BP. ? ?Lactic acid 1.1, WBC count up 19.9 from 12.3 yesterday, serum creatinine 1.40 up from 1.30 yesterday and cultures pending. ? ?Anti-infectives: ?Anti-infectives (From admission, onward)  ? ? Start     Dose/Rate Route Frequency Ordered Stop  ? 10/17/21 0600  cefTRIAXone (ROCEPHIN) 2 g in sodium chloride 0.9 % 100 mL IVPB       ? 2 g ?200 mL/hr over 30 Minutes Intravenous Every 24 hours 10/17/21 0303    ? 10/16/21 2245  ceFEPIme (MAXIPIME) 2 g in sodium chloride 0.9 % 100 mL IVPB       ? 2 g ?200 mL/hr over 30 Minutes Intravenous  Once 10/16/21 2232 10/16/21 2345  ? 10/16/21 2245  metroNIDAZOLE (FLAGYL) IVPB 500 mg       ? 500 mg ?100 mL/hr over 60 Minutes Intravenous  Once 10/16/21 2232 10/17/21 0044  ? 10/16/21 2245  vancomycin (VANCOCIN) IVPB 1000 mg/200 mL premix  Status:  Discontinued       ? 1,000 mg ?200 mL/hr over 60 Minutes Intravenous  Once 10/16/21 2232 10/16/21 2236  ? 10/16/21 2245  vancomycin (VANCOREADY) IVPB 2000 mg/400 mL       ?See Hyperspace for full Linked Orders Report.  ? 2,000 mg ?200 mL/hr over 120 Minutes Intravenous  Once 10/16/21 2237 10/17/21 0244  ? 10/16/21 2245  vancomycin (VANCOREADY) IVPB 500 mg/100 mL       ?See Hyperspace for full Linked Orders Report.  ? 500 mg ?100 mL/hr over 60 Minutes Intravenous  Once 10/16/21 2237    ? ?  ? ? ?Current Facility-Administered Medications  ?Medication Dose Route Frequency Provider Last Rate Last Admin  ? 0.9 %  sodium chloride infusion  250 mL Intravenous Continuous Nance Pear, MD   Held at 10/17/21 0005  ? acetaminophen (TYLENOL) tablet 650 mg  650 mg Oral Q6H PRN Teressa Lower, NP   650 mg at 10/17/21 0946  ? cefTRIAXone (ROCEPHIN) 2 g in sodium chloride 0.9 % 100 mL IVPB  2 g Intravenous Q24H  Lang Snow, NP   Stopped at 10/17/21 0603  ? docusate sodium (COLACE) capsule 100 mg  100 mg Oral BID PRN Lang Snow, NP      ? enoxaparin (LOVENOX) injection 50 mg  0.5 mg/kg Subcutaneous Q24H Lang Snow, NP   50 mg at 10/17/21 0857  ? insulin aspart (novoLOG) injection 0-5 Units  0-5 Units Subcutaneous QHS Teressa Lower, NP      ? insulin aspart (novoLOG) injection 0-9 Units  0-9 Units Subcutaneous TID WC Teressa Lower, NP      ? lactated ringers bolus 1,000 mL  1,000 mL Intravenous Once Nance Pear, MD      ? lactated ringers infusion   Intravenous Continuous Teressa Lower, NP 100 mL/hr at 10/17/21 0945 Rate Change at 10/17/21 0945  ? magnesium sulfate IVPB 4 g 100 mL  4 g Intravenous Once [name], RPH 50 mL/hr at 10/17/21 0925 Infusion Verify at 10/17/21 8413  ? norepinephrine (LEVOPHED) 16 mg in 251m premix infusion  0-40 mcg/min Intravenous Titrated NTeressa Lower NP 7.5 mL/hr at 10/17/21 0925 8 mcg/min at 10/17/21 0925  ? polyethylene glycol (MIRALAX / GLYCOLAX) packet 17 g  17 g Oral Daily PRN Lang Snow, NP      ? vancomycin (VANCOREADY) IVPB 500 mg/100 mL  500 mg Intravenous Once Nance Pear, MD 100 mL/hr at 10/17/21 0023 Restarted at 10/17/21 0023  ? ? ? ?Objective: ?Vital signs in last 24 hours: ?Temp:  [97.3 ?F (36.3 ?C)-103.1 ?F (39.5 ?C)] 102.9 ?F (39.4 ?C) (04/12 0900) ?Pulse Rate:  [56-124] 96 (04/12 0930) ?Resp:  [0-37] 30 (04/12 0930) ?BP: (62-150)/(39-98) 91/45 (04/12 0930) ?SpO2:  [86 %-100 %] 100 % (04/12 0930) ?FiO2 (%):  [2 %] 2 % (04/12 0356) ?Weight:  [99.8 kg] 99.8 kg (04/11 2230) ? ?Intake/Output from previous day: ?04/11 0701 - 04/12 0700 ?In: 400 [I.V.:400] ?Out: 415 [Urine:415] ?Intake/Output this shift: ?Total I/O ?In: 634.3 [I.V.:518.9; IV Piggyback:115.3] ?Out: -  ? ? ?Physical Exam ?Constitutional:   ?   Appearance: She is obese. She is ill-appearing.  ?HENT:  ?   Head: Normocephalic and atraumatic.  ?    Nose: Nose normal.  ?Eyes:  ?   Pupils: Pupils are equal, round, and reactive to light.  ?Pulmonary:  ?   Effort: Pulmonary effort is normal.  ?Abdominal:  ?   Palpations: Abdomen is soft.  ?Skin: ?   General: Skin is warm.  ?Neurological:  ?   General: No focal deficit present.  ?   Mental Status: She is alert.  ?Psychiatric:     ?   Mood and Affect: Mood normal.  ? ? ?Lab Results:  ?Recent Labs  ?  10/16/21 ?2235 10/17/21 ?0606  ?WBC 12.3* 19.9*  ?HGB 8.1* 8.1*  ?HCT 26.8* 27.2*  ?PLT 339 327  ? ?BMET ?Recent Labs  ?  10/16/21 ?2235 10/17/21 ?0606  ?NA 134* 136  ?K 3.6 4.3  ?CL 102 103  ?CO2 23 24  ?GLUCOSE 106* 97  ?BUN 14 16  ?CREATININE 1.30* 1.40*  ?CALCIUM 8.1* 7.9*  ? ?PT/INR ?Recent Labs  ?  10/16/21 ?2235  ?LABPROT 16.8*  ?INR 1.4*  ? ?ABG ?No results for input(s): PHART, HCO3 in the last 72 hours. ? ?Invalid input(s): PCO2, PO2 ? ?Studies/Results: ?DG Chest Port 1 View ? ?Result Date: 10/17/2021 ?CLINICAL DATA:  Status post central line placement EXAM: PORTABLE CHEST 1 VIEW COMPARISON:  10/16/2021 FINDINGS: Cardiac shadow is stable. Lungs are clear bilaterally. Right jugular central line is now noted in satisfactory position. No pneumothorax is seen. No bony abnormality is noted. IMPRESSION: No pneumothorax following central line placement. Electronically Signed   By: Inez Catalina M.D.   On: 10/17/2021 01:48  ? ?DG Chest Port 1 View ? ?Result Date: 10/16/2021 ?CLINICAL DATA:  Possible sepsis EXAM: PORTABLE CHEST 1 VIEW COMPARISON:  10/12/2021 FINDINGS: The heart size and mediastinal contours are within normal limits. Both lungs are clear. The visualized skeletal structures are unremarkable. Hypoventilatory changes. Subsegmental atelectasis at the bases. IMPRESSION: No active disease.  Low lung volumes Electronically Signed   By: Donavan Foil M.D.   On: 10/16/2021 22:58  ? ?DG OR UROLOGY CYSTO IMAGE ([place]) ? ?Result Date: 10/17/2021 ?There is no interpretation for this exam.  This order is for images  obtained during a surgical procedure.  Please See "Surgeries" Tab for more information regarding the procedure.  ? ?DG OR UROLOGY CYSTO IMAGE ([place]) ? ?Result Date: 10/16/2021 ?There is no interpretation for this exam.  This order is for images obtained during a surgical procedure.  Please See "Surgeries" Tab for more information regarding the procedure.  ? ?CT Renal  Stone Study ? ?Result Date: 10/16/2021 ?CLINICAL DATA:  Abdominal pain. Removal of the renal stent today as outpatient surgery. Fever and weakness after surgery. EXAM: CT ABDOMEN AND PELVIS WITHOUT CONTRAST TECHNIQUE: Multidetector CT imaging of the abdomen and pelvis was performed following the standard protocol without IV contrast. RADIATION DOSE REDUCTION: This exam was performed according to the departmental dose-optimization program which includes automated exposure control, adjustment of the mA and/or kV according to patient size and/or use of iterative reconstruction technique. COMPARISON:  CT examination dated September 11, 2021 FINDINGS: Lower chest: No acute abnormality. Hepatobiliary: No focal liver abnormality is seen. No gallstones, gallbladder wall thickening, or biliary dilatation. Pancreas: Unremarkable. No pancreatic ductal dilatation or surrounding inflammatory changes. Spleen: Normal in size without focal abnormality. Adrenals/Urinary Tract: Adrenal glands are unremarkable. 3 mm nonobstructing calculus in the interpolar region of the right kidney. No right hydronephrosis or ureteral calculus. Moderate left hydronephrosis and hydroureter with excreted contrast in the ureter. No left ureteral calculus mild left perinephric fat stranding, similar to prior examination. Urinary bladder is underdistended with mild thickening of the walls, which may be secondary to urinary tract infection or under distention. Stomach/Bowel: Stomach is within normal limits. Appendix appears normal. No evidence of bowel wall thickening, distention, or  inflammatory changes. Vascular/Lymphatic: Aortic atherosclerosis. No enlarged abdominal or pelvic lymph nodes. IVC filter in place. Reproductive: Uterus and bilateral adnexa are unremarkable. Other: No abdominal w

## 2021-10-17 NOTE — Progress Notes (Addendum)
PHARMACY CONSULT NOTE - FOLLOW UP ? ?Pharmacy Consult for Electrolyte Monitoring and Replacement  ? ?Recent Labs: ?Potassium (mmol/L)  ?Date Value  ?10/17/2021 4.3  ? ?Magnesium (mg/dL)  ?Date Value  ?09/14/2021 2.1  ? ?Calcium (mg/dL)  ?Date Value  ?10/17/2021 7.9 (L)  ? ?Albumin (g/dL)  ?Date Value  ?10/16/2021 2.4 (L)  ? ?Phosphorus (mg/dL)  ?Date Value  ?10/17/2021 4.2  ? ?Sodium (mmol/L)  ?Date Value  ?10/17/2021 136  ? ?Scr: 0.88 > 1.40 ?Corrected Calcium: 9.18 mg/dl ? ?Assessment: ?66 year old female with chief complaint of abdominal pain, generalized weakness, fevers, and chills. Past medical history includes recurrent DVT/PE rivaroxaban, HTN, HLD, DM, Iron Deficiency Anemia, obesity, Endometrial Cancer, dCHF, cellulitis of Left leg, recurrent GI bleed. Recent hospitalization with urosepsis 2/2 Proteus and Enterobacter bacteremia s/p left ureteral stent. Admitted with septic shock and severe urosepsis.  ? ?LR 125 mL/hr. On ceftriaxone and vancomycin for the above. ? ?Goal of Therapy:  ?Electrolytes within normal limits ? ?Plan:  ?Magnesium 1.3. Give magnesium 4 grams x 1. ?Follow up electrolytes in the AM ? ? ?Wynelle Cleveland, PharmD ?Pharmacy Resident  ?10/17/2021 ?7:12 AM ? ? ?

## 2021-10-17 NOTE — Consult Note (Signed)
? ?Urology Consult ? ?Requesting physician: Nance Pear, MD ? ?Reason for consultation: Sepsis with left hydronephrosis ? ? ?History of Present Illness: Courtney Mack is a 66 y.o. female with a history of sepsis/septic shock from a urinary source status post cystoscopy with left ureteral stent placement 09/11/2021.  She had a 2 mm left distal ureteral calculus.  She underwent left ureteroscopy this morning which was negative for stone.  On retrograde pyelogram she had prompt emptying of contrast from the ureter. ? ?She states a few hours after stent removal she began to complain of weakness and left abdominal discomfort.  Was transported to the ED by EMS.  She was hypotensive with temp of 103.1. ? ?Noncontrast CT of the abdomen pelvis was performed which showed left hydronephrosis and hydroureter to the bladder though no stone was identified.  Some contrast was still in the system from prior retrograde pyelogram ? ? ?Past Medical History:  ?Diagnosis Date  ? Acute bilateral deep vein thrombosis (DVT) of femoral veins ([place]) 01/02/2021  ? Acute massive pulmonary embolism ([place]) 08/25/2020  ? Last Assessment & Plan:  Formatting of this note might be different from the original. Post vena caval filter and on xarelto and O2  ? ARF (acute respiratory failure) ([place])   ? Cancer [facility])   ? endometrial  ? Cellulitis of left lower leg 06/20/2021  ? CHF (congestive heart failure) ([place])   ? COVID-19   ? Diabetes mellitus without complication ([place])   ? DVT (deep venous thrombosis) ([place]) 09/06/2020  ? High cholesterol   ? Hx of blood clots   ? Hyperlipidemia   ? Hypertension   ? IDA (iron deficiency anemia) 09/21/2020  ? Obesity   ? Pressure injury of skin 06/21/2021  ? Pulmonary embolism ([place])   ? Sepsis ([place])   ? Thrombocytopenia ([place])   ? Urinary tract infection 09/13/2021  ? ? ?Past Surgical History:  ?Procedure Laterality Date  ? COLONOSCOPY N/A 06/21/2021  ? Procedure: COLONOSCOPY;  Surgeon: Toledo, Benay Pike, MD;   Location: [facility] ENDOSCOPY;  Service: Gastroenterology;  Laterality: N/A;  ? CYSTOSCOPY W/ URETERAL STENT PLACEMENT Left 09/11/2021  ? Procedure: CYSTOSCOPY WITH RETROGRADE PYELOGRAM/URETERAL STENT PLACEMENT;  Surgeon: Abbie Sons, MD;  Location: [facility] ORS;  Service: Urology;  Laterality: Left;  ? CYSTOSCOPY WITH URETEROSCOPY, STONE BASKETRY AND STENT PLACEMENT Left 10/16/2021  ? Procedure: CYSTOSCOPY WITH URETEROSCOPY  AND STENT REMOVAL;  Surgeon: Abbie Sons, MD;  Location: [facility] ORS;  Service: Urology;  Laterality: Left;  ? ENTEROSCOPY N/A 08/17/2021  ? Procedure: ENTEROSCOPY;  Surgeon: Lin Landsman, MD;  Location: [facility] ENDOSCOPY;  Service: Gastroenterology;  Laterality: N/A;  ? ESOPHAGOGASTRODUODENOSCOPY N/A 06/21/2021  ? Procedure: ESOPHAGOGASTRODUODENOSCOPY (EGD);  Surgeon: Toledo, Benay Pike, MD;  Location: [facility] ENDOSCOPY;  Service: Gastroenterology;  Laterality: N/A;  ? ESOPHAGOGASTRODUODENOSCOPY (EGD) WITH PROPOFOL N/A 08/17/2021  ? Procedure: ESOPHAGOGASTRODUODENOSCOPY (EGD) WITH PROPOFOL;  Surgeon: Lin Landsman, MD;  Location[facility] ENDOSCOPY;  Service: Gastroenterology;  Laterality: N/A;  ? ESOPHAGOGASTRODUODENOSCOPY (EGD) WITH PROPOFOL N/A 10/14/2021  ? Procedure: ESOPHAGOGASTRODUODENOSCOPY (EGD) WITH PROPOFOL;  Surgeon: [name], MD;  Location: [facility] ENDOSCOPY;  Service: Endoscopy;  Laterality: N/A;  ? GIVENS CAPSULE STUDY N/A 06/22/2021  ? Procedure: GIVENS CAPSULE STUDY;  Surgeon: Toledo, Benay Pike, MD;  Location: [facility] ENDOSCOPY;  Service: Gastroenterology;  Laterality: N/A;  ? IVC FILTER INSERTION N/A 08/18/2020  ? Procedure: IVC FILTER INSERTION;  Surgeon: Katha Cabal, MD;  Location: [place] CV LAB;  Service: Cardiovascular;  Laterality: N/A;  ? PERIPHERAL VASCULAR THROMBECTOMY Bilateral 01/03/2021  ? Procedure: PERIPHERAL VASCULAR THROMBECTOMY;  Surgeon: Algernon Huxley, MD;  Location: [place] CV LAB;  Service: Cardiovascular;  Laterality: Bilateral;  ? PULMONARY  THROMBECTOMY N/A 08/18/2020  ? Procedure: PULMONARY THROMBECTOMY;  Surgeon: Katha Cabal, MD;  Location: [place] CV LAB;  Service: Cardiovascular;  Laterality: N/A;  ? TUBAL LIGATION    ? VISCERAL ANGIOGRAPHY N/A 07/18/2021  ? Procedure: VISCERAL ANGIOGRAPHY;  Surgeon: Algernon Huxley, MD;  Location: [place] CV LAB;  Service: Cardiovascular;  Laterality: N/A;  ? ? ?Home Medications:  ?Current Meds  ?Medication Sig  ? Ascorbic Acid (VITAMIN C) 1000 MG tablet Take 1,000 mg by mouth daily.  ? Ferrous Sulfate (IRON) 325 (65 Fe) MG TABS Take 325 mg by mouth every other day.  ? fluconazole (DIFLUCAN) 200 MG tablet Take 1 tablet (200 mg total) by mouth daily for 6 days.  ? furosemide (LASIX) 20 MG tablet Take 1 tablet (20 mg total) by mouth daily. (Patient taking differently: Take 20 mg by mouth every morning.)  ? loperamide (IMODIUM) 2 MG capsule Take 1 capsule (2 mg total) by mouth as needed for diarrhea or loose stools.  ? mometasone-formoterol (DULERA) 100-5 MCG/ACT AERO Inhale 2 puffs into the lungs 2 (two) times daily.  ? Multiple Vitamin (MULTIVITAMIN WITH MINERALS) TABS tablet Take 1 tablet by mouth daily.  ? pantoprazole (PROTONIX) 40 MG tablet Take 40 mg by mouth every morning.  ? rivaroxaban (XARELTO) 20 MG TABS tablet Take 1 tablet (20 mg total) by mouth daily with supper.  ? rosuvastatin (CRESTOR) 10 MG tablet Take 1 tablet (10 mg total) by mouth daily. (Patient taking differently: Take 10 mg by mouth every morning.)  ? ? ?Allergies: No Known Allergies ? ?Family History  ?Problem Relation Age of Onset  ? Hypertension Mother   ? Diabetes Mother   ? Diabetes Father   ? Hypertension Father   ? High Cholesterol Father   ? Congestive Heart Failure Father   ? Breast cancer Cousin   ? ? ?Social History:  reports that she has never smoked. She has never used smokeless tobacco. She reports that she does not currently use alcohol. She reports that she does not currently use drugs. ? ?ROS: ?As per the  HPI ? ?Physical Exam:  ?Vital signs in last 24 hours: ?Temp:  [97.3 ?F (36.3 ?C)-103.1 ?F (39.5 ?C)] 103.1 ?F (39.5 ?C) (04/11 2229) ?Pulse Rate:  [73-124] 104 (04/12 0010) ?Resp:  [15-37] 31 (04/12 0010) ?BP: (62-131)/(39-68) 78/40 (04/12 0010) ?SpO2:  [94 %-99 %] 97 % (04/12 0010) ?Weight:  [86.6 kg-99.8 kg] 99.8 kg (04/11 2230) ?Constitutional:  Alert, ill-appearing but no acute distress ?HEENT: [place] AT, moist mucus membranes.  Trachea midline, no masses ?Cardiovascular: Increased rate and regular rhythm ?Respiratory: Normal respiratory effort, lungs clear bilaterally ?GI: Abdomen is soft, nontender, nondistended, no abdominal masses ?GU: No CVA tenderness ? ? ?Laboratory Data:  ?Recent Labs  ?  10/14/21 ?0952 10/16/21 ?2235  ?WBC  --  12.3*  ?HGB 7.8* 8.1*  ?HCT  --  26.8*  ? ?Recent Labs  ?  10/16/21 ?2235  ?NA 134*  ?K 3.6  ?CL 102  ?CO2 23  ?GLUCOSE 106*  ?BUN 14  ?CREATININE 1.30*  ?CALCIUM 8.1*  ? ?Recent Labs  ?  10/16/21 ?2235  ?INR 1.4*  ? ?No results for input(s): LABURIN in the last 72 hours. ?Results for orders placed or performed during the hospital  encounter of 10/16/21  ?Resp Panel by RT-PCR (Flu A&B, Covid) Nasopharyngeal Swab     Status: None  ? Collection Time: 10/16/21 10:35 PM  ? Specimen: Nasopharyngeal Swab; Nasopharyngeal(NP) swabs in vial transport medium  ?Result Value Ref Range Status  ? SARS Coronavirus 2 by RT PCR NEGATIVE NEGATIVE Final  ?  Comment: (NOTE) ?SARS-CoV-2 target nucleic acids are NOT DETECTED. ? ?The SARS-CoV-2 RNA is generally detectable in upper respiratory ?specimens during the acute phase of infection. The lowest ?concentration of SARS-CoV-2 viral copies this assay can detect is ?138 copies/mL. A negative result does not preclude SARS-Cov-2 ?infection and should not be used as the sole basis for treatment or ?other patient management decisions. A negative result may occur with  ?improper specimen collection/handling, submission of specimen other ?than nasopharyngeal  swab, presence of viral mutation(s) within the ?areas targeted by this assay, and inadequate number of viral ?copies(<138 copies/mL). A negative result must be combined with ?clinical observations, patient history, and

## 2021-10-17 NOTE — Transfer of Care (Signed)
Immediate Anesthesia Transfer of Care Note ? ?Patient: Courtney Mack ? ?Procedure(s) Performed: CYSTOSCOPY WITH STENT PLACEMENT (Left: Ureter) ? ?Patient Location: PACU ? ?Anesthesia Type:MAC ? ?Level of Consciousness: sedated and patient cooperative ? ?Airway & Oxygen Therapy: Patient Spontanous Breathing and Patient connected to face mask oxygen ? ?Post-op Assessment: Report given to RN and Post -op Vital signs reviewed and stable ? ?Post vital signs: Reviewed and stable ? ?Last Vitals:  ?Vitals Value Taken Time  ?BP 89/52 10/17/21 0250  ?Temp 37.8 ?C 10/17/21 0244  ?Pulse 103 10/17/21 0254  ?Resp 20 10/17/21 0254  ?SpO2 95 % 10/17/21 0254  ?Vitals shown include unvalidated device data. ? ?Last Pain:  ?Vitals:  ? 10/17/21 0244  ?TempSrc:   ?PainSc: 0-No pain  ?   ? ?  ? ?Complications: No notable events documented. ?

## 2021-10-17 NOTE — Progress Notes (Signed)
Responded to protocol consult for US guided IV due to vasopressor order. Spoke with primary RN who stated central line is to be placed. Consult cleared. ?

## 2021-10-17 NOTE — Progress Notes (Signed)
Notified by pts RN pt having leakage around 16 fr indwelling foley catheter with complaints of pain.  Bladder scan performed with 0 ml of urine present in bladder. Notified Dr. Bernardo Heater via secure chat in epic recommended irrigating indwelling foley catheter.  RN performed bladder irrigation.  During irrigation pt voided around indwelling foley catheter and endorsed pain.  Dr. Bernardo Heater recommended removing 16 fr foley catheter and replace with 20 fr indwelling foley catheter.  However, RN attempted to insert 20 fr indwelling foley catheter x2, but met resistance.  Therefore, unable to proceed with insertion.  Dr. Bernardo Heater notified by RN and order given to place [place]. ? ? ?Donell Beers, AGNP  ?Pulmonary/Critical Care ?Pager [phone] (please enter 7 digits) ?PCCM Consult Pager [phone] (please enter 7 digits)  ?

## 2021-10-17 NOTE — Anesthesia Postprocedure Evaluation (Signed)
Anesthesia Post Note ? ?Patient: Courtney Mack ? ?Procedure(s) Performed: CYSTOSCOPY WITH STENT PLACEMENT (Left: Ureter) ? ?Patient location during evaluation: ICU ?Anesthesia Type: MAC ?Level of consciousness: awake and alert, oriented and patient cooperative ?Pain management: pain level controlled ?Vital Signs Assessment: post-procedure vital signs reviewed and stable ?Respiratory status: spontaneous breathing, nonlabored ventilation and respiratory function stable ?Cardiovascular status: blood pressure returned to baseline (on norepinephrine at preop rate) ?Postop Assessment: adequate PO intake ?Anesthetic complications: no ? ? ?No notable events documented. ? ? ?Last Vitals:  ?Vitals:  ? 10/17/21 0356 10/17/21 0400  ?BP:  (!) 87/58  ?Pulse:    ?Resp:    ?Temp:  37.4 ?C  ?SpO2: 97%   ?  ?Last Pain:  ?Vitals:  ? 10/17/21 0400  ?TempSrc: Axillary  ?PainSc:   ? ? ?  ?  ?  ?  ?  ?  ? ?Darrin Nipper ? ? ? ? ?

## 2021-10-17 NOTE — OR Nursing (Signed)
ED RN states that pt to go to ICU prior for central line, MD and CRNA notified. Charge Nurse in ICU spoken with who states that she will call when pt ready to come to OR. ?

## 2021-10-17 NOTE — H&P (Addendum)
? ? ?NAME:  Courtney Mack, MRN:  [id number], DOB:  [birth year], LOS: 0 ?ADMISSION DATE:  10/16/2021, CONSULTATION DATE:  10/17/21 ?REFERRING MD:  Nance Pear MD  CHIEF COMPLAINT: Abdomnal Pain, Fevers and chills  ? ? ?HPI  ?66 y.o with significant PMH of recurrent DVT/PE (08/2020) and (12/2020) on Xarelto s/p Suction thrombectomy with IVC filter placement, HTN, HLD, DM, Iron Deficiency Anemia, obesity, Endometrial Cancer, dCHF, cellulitis of Left leg, recurrent GI bleed December 2022 (Hgb 3.6), nonbleeding ulcer identified, Xarelto restarted, COVID March 2023 and recent Hospitalization on March 2023  with urosepsis 2/2 Proteus and Enterobacter bacteremia s/p left ureteral stent placed who presented to the ED with c/o abdominal pain, generalized weakness, fevers and chills. ? ?Patient underwent left ureteroscopy yesterday morning which was negative for obstructive stone. A few hours after stent removal, patient began to c/o of left abdominal pain, malaise, fevers and chills. She presented to the ED for further evaluation. ? ?ED Course: In the emergency department, the temperature was 103.1 ?F (39.5 ?C), the heart rate 124 beats/minute, the blood pressure 80/43 mm Hg, the respiratory rate 30 breaths/minute, and the oxygen saturation 94% on 2L.  ?Pertinent Labs in Red/Diagnostics Findings: ?Na+/ K+: 134/3.6 ?Glucose: 106 ?BUN/Cr.:14/1.30 ?Calcium: 8.1 ?  ?WBC/ TMAX:12.3 / 103.1 ?Hgb/Hct: 8.1/26.8 ?PCT: 74 ?Lactic acid:1.9 ?COVID PCR: Negative ? ?CT Renal stone was obtained and showed 1.66m right nonobstructive stone without hydronephrosis or ureteral calculus, perisitent left hydronephrosis and hydroureter without calculus with mild left perinephric fat stranding. Patient given 30 cc/kg of fluids and started on broad-spectrum antibiotics Vanco cefepime and Flagyl for sepsis with septic shock. Patient remained hypotensive despite IVF boluses therefore was started on Levophed.  Case discussed with urologist who  recommended emergent cystoscopy with placement of left ureteral stent. PCCM consulted. ? ?Past Medical History  ?recurrent DVT/PE (08/2020) and (12/2020) on Xarelto s/p Suction thrombectomy with IVC filter placement ?HTN ?HLD ?DM ?Iron Deficiency Anemia ?Endometrial Cancer ?dCHF ?Cellulitis of Left leg ?recurrent GI bleed December 2022 (Hgb 3.6), nonbleeding ulcer identified, Xarelto restarted ?COVID March 2023 and recent Hospitalization on March 2023  with urosepsis 2/2 Proteus and Enterobacter bacteremia s/p left ureteral stent  ? ?Significant Hospital Events   ?4/11: presented to ED  ?4/12: Admitted to ICU with severe septic shock 2/2 UTI s/p emergent cystoscopy with placement of left ureteral stent. ? ?Consults:  ?Urology ? ?Procedures:  ?4/12:Right IJ central line ? ?Significant Diagnostic Tests:  ?4/11: Chest Xray>No active disease.  Low lung volumes ?4/11: CT Renal stone>1. 3 mm nonobstructing calculus in the interpolar region of the ?right kidney. No right hydronephrosis or ureteral calculus. ?  ?2. Persistent left hydronephrosis and hydroureter without evidence ?of calculus. There is excreted contrast in the left collecting ?system concerning for delayed excretion in the left kidney. Mild ?left perinephric fat stranding, similar to prior examination. ?  ?3. Urinary bladder is underdistended with thickening of the walls, ?which may be secondary to under distention or urinary tract ?infection. Clinical correlation is suggested. ?  ?4.  Moderate generalized anasarca, similar to prior examination. ?  ?5. Degenerative disease of the lumbar spine and severe bilateral hip ?osteoarthritis. ?Micro Data:  ?4/11: SARS-CoV-2 PCR> negative ?4/11: Influenza PCR> negative ?4/11: Blood culture x2> ?4/11: Urine Culture> ?4/11: MRSA PCR>>  ? ?Antimicrobials:  ?Vancomycin 4/11>x 1 dose ?Cefepime 4/11>x1 dose ?Metronidazole 4/11>x 1 dose ?Ceftriaxone 4/12>> ? ?OBJECTIVE  ?Blood pressure (!) 78/40, pulse (!) 104, temperature (!)  103.1 ?F (39.5 ?C), temperature source Oral, resp.  rate (!) 31, height '4\' 10"'$  (1.473 m), weight 99.8 kg, SpO2 97 %. ?   ?   ?No intake or output data in the 24 hours ending 10/17/21 0022 ?Filed Weights  ? 10/16/21 2230  ?Weight: 99.8 kg  ? ?Physical Examination  ?GENERAL: 66 year-old critically ill patient lying in the bed with no acute distress.  ?EYES: Pupils equal, round, reactive to light and accommodation. No scleral icterus. Extraocular muscles intact.  ?HEENT: Head atraumatic, normocephalic. Oropharynx and nasopharynx clear.  ?NECK:  Supple, no jugular venous distention. No thyroid enlargement, no tenderness.  ?LUNGS: Normal breath sounds bilaterally, no wheezing, rales,rhonchi or crepitation. No use of accessory muscles of respiration.  ?CARDIOVASCULAR: S1, S2 normal. No murmurs, rubs, or gallops.  ?ABDOMEN: Soft, tender, MILD left flank pain. nondistended. Bowel sounds present. No organomegaly or mass.  ?EXTREMITIES: Generalized edema, Lower extremities pitting edema ?NEUROLOGIC: Cranial nerves II through XII are intact.  Muscle strength 4/5 in all extremities BUE> BLE. Sensation intact. Gait not checked.  ?PSYCHIATRIC: The patient is alert and oriented x 3.  ?SKIN: No obvious rash, lesion, or ulcer.  ? ?Labs/imaging that I havepersonally reviewed  ?(right click and "Reselect all SmartList Selections" daily)  ? ?  ?Labs   ?CBC: ?Recent Labs  ?Lab 10/12/21 ?1238 10/12/21 ?1540 10/13/21 ?0539 10/14/21 ?7673 10/16/21 ?2235  ?WBC 6.2 7.0 9.3  --  12.3*  ?NEUTROABS  --  5.7  --   --  11.4*  ?HGB 6.4* 6.6* 7.7* 7.8* 8.1*  ?HCT 21.5* 22.7* 24.8*  --  26.8*  ?MCV 79.9* 81.1 79.7*  --  81.7  ?PLT 338 349 300  --  339  ? ? ?Basic Metabolic Panel: ?Recent Labs  ?Lab 10/12/21 ?1540 10/13/21 ?4193 10/16/21 ?2235  ?NA 135 135 134*  ?K 3.3* 3.5 3.6  ?CL 100 103 102  ?CO2 '24 26 23  '$ ?GLUCOSE 134* 103* 106*  ?BUN '21 18 14  '$ ?CREATININE 0.85 0.88 1.30*  ?CALCIUM 8.8* 8.4* 8.1*  ? ?GFR: ?Estimated Creatinine Clearance: 43.3  mL/min (A) (by C-G formula based on SCr of 1.3 mg/dL (H)). ?Recent Labs  ?Lab 10/12/21 ?1238 10/12/21 ?1540 10/13/21 ?7902 10/16/21 ?2235  ?WBC 6.2 7.0 9.3 12.3*  ?LATICACIDVEN  --   --   --  1.9  ? ? ?Liver Function Tests: ?Recent Labs  ?Lab 10/12/21 ?1540 10/13/21 ?4097 10/16/21 ?2235  ?AST '27 15 28  '$ ?ALT '12 9 12  '$ ?ALKPHOS 44 38 57  ?BILITOT 0.7 1.3* 1.2  ?PROT 7.7 6.4* 6.6  ?ALBUMIN 2.9* 2.4* 2.4*  ? ?No results for input(s): LIPASE, AMYLASE in the last 168 hours. ?No results for input(s): AMMONIA in the last 168 hours. ? ?ABG ?No results found for: PHART, PCO2ART, PO2ART, HCO3, TCO2, ACIDBASEDEF, O2SAT  ? ?Coagulation Profile: ?Recent Labs  ?Lab 10/12/21 ?1540 10/16/21 ?2235  ?INR 1.3* 1.4*  ? ? ?Cardiac Enzymes: ?No results for input(s): CKTOTAL, CKMB, CKMBINDEX, TROPONINI in the last 168 hours. ? ?HbA1C: ?Hgb A1c MFr Bld  ?Date/Time Value Ref Range Status  ?08/15/2021 05:32 AM 6.0 (H) 4.8 - 5.6 % Final  ?  Comment:  ?  (NOTE) ?Pre diabetes:          5.7%-6.4% ? ?Diabetes:              >6.4% ? ?Glycemic control for   <7.0% ?adults with diabetes ?  ?01/02/2021 01:58 PM 6.2 (H) 4.8 - 5.6 % Final  ?  Comment:  ?  (NOTE) ?  Prediabetes: 5.7 - 6.4 ?        Diabetes: >6.4 ?        Glycemic control for adults with diabetes: <7.0 ?  ? ? ?CBG: ?Recent Labs  ?Lab 10/14/21 ?1304 10/16/21 ?3151 10/16/21 ?1100  ?GLUCAP 87 93 96  ? ? ?Review of Systems:   ?Review of Systems  ?Constitutional:  Positive for chills, fever and malaise/fatigue. Negative for diaphoresis and weight loss.  ?HENT:  Positive for ear pain and hearing loss.   ?Eyes: Negative.   ?Cardiovascular:  Positive for orthopnea and leg swelling.  ?Gastrointestinal:  Positive for abdominal pain.  ?Genitourinary:  Positive for dysuria, flank pain, frequency and urgency.  ?Musculoskeletal:  Positive for back pain.  ?Skin: Negative.   ?Neurological: Negative.   ?Psychiatric/Behavioral: Negative.    ? ?Past Medical History  ?She,  has a past medical history of  Acute bilateral deep vein thrombosis (DVT) of femoral veins ([place]) (01/02/2021), Acute massive pulmonary embolism ([place]) (08/25/2020), ARF (acute respiratory failure) ([place]), Cancer ([place]), Cellulitis of left lowe

## 2021-10-17 NOTE — Progress Notes (Signed)
Anticoagulation monitoring(Lovenox): ? ?66 yo female ordered Lovenox 40 mg Q24h ?   ?Filed Weights  ? 10/16/21 2230  ?Weight: 99.8 kg (220 lb 0.3 oz)  ? ?BMI 46  ? ?Lab Results  ?Component Value Date  ? CREATININE 1.30 (H) 10/16/2021  ? CREATININE 0.88 10/13/2021  ? CREATININE 0.85 10/12/2021  ? ?Estimated Creatinine Clearance: 43.3 mL/min (A) (by C-G formula based on SCr of 1.3 mg/dL (H)). ?Hemoglobin & Hematocrit  ?   ?Component Value Date/Time  ? HGB 8.1 (L) 10/16/2021 2235  ? HCT 26.8 (L) 10/16/2021 2235  ? ? ? ?Per Protocol for Patient with estCrcl > 30 ml/min and BMI > 30, will transition to Lovenox 50 mg Q24h.  ?  ? ? ?

## 2021-10-17 NOTE — Progress Notes (Signed)
20 fr foley attempted twice. Once by this RN and then by Bernerd Limbo.  Each attempt resistance met and foley unable to be advanced.  Patient verbalized she would rather have purwick if that is a possibility. Dr. Darlys Gales notified. No new orders at this time.  ?

## 2021-10-17 NOTE — Op Note (Signed)
Preoperative diagnosis:  ?Left hydronephrosis/hydroureter without calculus ?Sepsis from a suspected urinary source ?Septic shock ? ?Postoperative diagnosis:  ?Same ? ?Procedure: ?Cystoscopy ?Left ureteral stent placement ?Left retrograde pyelogram ? ?Surgeon: Abbie Sons, MD ? ?Anesthesia: MAC ? ?Complications: None ? ?Intraoperative findings:  ?Marked inflammatory changes, bullous edema of the trigone.  Ureteral orifices not identified ?Moderate left hydronephrosis ?Urine left renal pelvis amber, slightly cloudy ? ?EBL: Minimal ? ?Specimens: Urine left renal pelvis for culture ? ?Indication: Courtney Mack is a 66 y.o. with a history of sepsis from an obstructing 2 mm left distal ureteral calculus status post stent placement 09/11/2021.  She underwent ureteroscopy yesterday morning and no calculus was identified.  Left retrograde pyelogram showed no obstruction and prompt emptying of the ureter on real-time fluoroscopy.  After few hours discharge she began to complain of sluggishness and malaise.  Subsequently transported to the ED by EMS where she was hypotensive with a systolic BP of 80 and a temp of 103.1.  Noncontrast CT showed moderate hydronephrosis and hydroureter to the UVJ but no calculus identified.  Some residual contrast from retrograde pyelogram was still present.  Emergent stent placement was recommended. Informed consent has been obtained. ? ?Description of procedure: ? ?The patient was taken to the operating room and deep sedation was obtained by anesthesia.  Positioning was difficult due to hip and knee immobility.  She was prepped and draped.  Currently receiving empiric IV antibiotics.  A preoperative time-out was performed.  ? ?A 21 French cystoscope was lubricated and passed per urethra.  Marked bullous edema and inflammatory changes of the entire trigone and UOs could not be identified.  A 0.038 Sensor wire was placed to the cystoscope and areas in the expected location of the left UO  were probed with the wire tip and the UO was located after the second attempt.  The wire was advanced under fluoroscopic guidance. ? ?A 5 French open ended ureteral catheter was then placed over the guidewire to the region of the renal pelvis.  10 cc of urine was aspirated and sent for culture.  Retrograde pyelogram was then performed with findings as described above.  The guidewire was placed and the ureteral catheter was removed. ? ?A 82F/24 cm Contour ureteral stent was then placed over the wire.  There was good curl seen in the renal pelvis on fluoroscopy and the distal end of stent was well positioned under direct vision. ? ?A Foley catheter was placed to maximize urinary drainage. ? ?She was then transported to the PACU in guarded condition. ? ? ?Recommendation: ?Continue Foley catheter drainage until clinical improvement is seen ? ? ?Abbie Sons, M.D. ? ?

## 2021-10-17 NOTE — Procedures (Signed)
Central Venous Catheter Insertion Procedure Note ? ?Courtney Mack  ?[id number]  ?[birth year] ? ?Date:10/17/21  ?Time:3:36 AM  ? ?Provider Performing:Johne Buckle A [facility]  ? ?Procedure: Insertion of Non-tunneled Central Venous Catheter(36556) with US guidance (39030)  ? ?Indication(s) ?Medication administration and Difficult access ? ?Consent ?Risks of the procedure as well as the alternatives and risks of each were explained to the patient and/or caregiver.  Consent for the procedure was obtained and is signed in the bedside chart ? ?Anesthesia ?Topical only with 1% lidocaine  ? ?Timeout ?Verified patient identification, verified procedure, site/side was marked, verified correct patient position, special equipment/implants available, medications/allergies/relevant history reviewed, required imaging and test results available. ? ?Sterile Technique ?Maximal sterile technique including full sterile barrier drape, hand hygiene, sterile gown, sterile gloves, mask, hair covering, sterile ultrasound probe cover (if used). ? ?Procedure Description ?Area of catheter insertion was cleaned with chlorhexidine and draped in sterile fashion.  With real-time ultrasound guidance a central venous catheter was placed into the right internal jugular vein. Nonpulsatile blood flow and easy flushing noted in all ports.  The catheter was sutured in place and sterile dressing applied. ? ?Complications/Tolerance ?None; patient tolerated the procedure well. ?Chest X-ray is ordered to verify placement for internal jugular or subclavian cannulation.   Chest x-ray is not ordered for femoral cannulation. ? ?EBL ?Minimal ? ?Specimen(s) ?None ? ? ?Rufina Falco, DNP, CCRN, FNP-C, AGACNP-BC ?Acute Care & Family Nurse Practitioner  ?[facility]  ?See Amion for personal pager ?PCCM on call pager [phone] until 7 am ? ? ? ?

## 2021-10-18 ENCOUNTER — Other Ambulatory Visit: Payer: Self-pay | Admitting: Physician Assistant

## 2021-10-18 ENCOUNTER — Inpatient Hospital Stay: Payer: Medicare (Managed Care)

## 2021-10-18 LAB — CBC WITH DIFFERENTIAL/PLATELET
Abs Immature Granulocytes: 0.09 10*3/uL — ABNORMAL HIGH (ref 0.00–0.07)
Basophils Absolute: 0 10*3/uL (ref 0.0–0.1)
Basophils Relative: 0 %
Eosinophils Absolute: 0.3 10*3/uL (ref 0.0–0.5)
Eosinophils Relative: 3 %
HCT: 23.3 % — ABNORMAL LOW (ref 36.0–46.0)
Hemoglobin: 6.9 g/dL — ABNORMAL LOW (ref 12.0–15.0)
Immature Granulocytes: 1 %
Lymphocytes Relative: 4 %
Lymphs Abs: 0.4 10*3/uL — ABNORMAL LOW (ref 0.7–4.0)
MCH: 24.3 pg — ABNORMAL LOW (ref 26.0–34.0)
MCHC: 29.6 g/dL — ABNORMAL LOW (ref 30.0–36.0)
MCV: 82 fL (ref 80.0–100.0)
Monocytes Absolute: 0.3 10*3/uL (ref 0.1–1.0)
Monocytes Relative: 2 %
Neutro Abs: 10.6 10*3/uL — ABNORMAL HIGH (ref 1.7–7.7)
Neutrophils Relative %: 90 %
Platelets: 256 10*3/uL (ref 150–400)
RBC: 2.84 MIL/uL — ABNORMAL LOW (ref 3.87–5.11)
RDW: 18.4 % — ABNORMAL HIGH (ref 11.5–15.5)
WBC: 11.7 10*3/uL — ABNORMAL HIGH (ref 4.0–10.5)
nRBC: 0 % (ref 0.0–0.2)

## 2021-10-18 LAB — GLUCOSE, CAPILLARY
Glucose-Capillary: 136 mg/dL — ABNORMAL HIGH (ref 70–99)
Glucose-Capillary: 171 mg/dL — ABNORMAL HIGH (ref 70–99)
Glucose-Capillary: 146 mg/dL — ABNORMAL HIGH (ref 70–99)
Glucose-Capillary: 158 mg/dL — ABNORMAL HIGH (ref 70–99)

## 2021-10-18 LAB — URINE CULTURE: Culture: NO GROWTH

## 2021-10-18 LAB — BASIC METABOLIC PANEL
Anion gap: 5 (ref 5–15)
BUN: 19 mg/dL (ref 8–23)
CO2: 25 mmol/L (ref 22–32)
Calcium: 7.6 mg/dL — ABNORMAL LOW (ref 8.9–10.3)
Chloride: 104 mmol/L (ref 98–111)
Creatinine, Ser: 1.15 mg/dL — ABNORMAL HIGH (ref 0.44–1.00)
GFR, Estimated: 53 mL/min — ABNORMAL LOW (ref >60)
Glucose, Bld: 177 mg/dL — ABNORMAL HIGH (ref 70–99)
Potassium: 3.8 mmol/L (ref 3.5–5.1)
Sodium: 134 mmol/L — ABNORMAL LOW (ref 135–145)

## 2021-10-18 LAB — PREPARE RBC (CROSSMATCH)

## 2021-10-18 LAB — PROCALCITONIN: Procalcitonin: 53.88 ng/mL

## 2021-10-18 LAB — MAGNESIUM: Magnesium: 1.9 mg/dL (ref 1.7–2.4)

## 2021-10-18 LAB — PHOSPHORUS: Phosphorus: 2.6 mg/dL (ref 2.5–4.6)

## 2021-10-18 LAB — HEMOGLOBIN AND HEMATOCRIT, BLOOD
HCT: 26.6 % — ABNORMAL LOW (ref 36.0–46.0)
Hemoglobin: 8 g/dL — ABNORMAL LOW (ref 12.0–15.0)

## 2021-10-18 MED ORDER — NYSTATIN 100000 UNIT/ML MT SUSP
5.0000 mL | Freq: Four times a day (QID) | OROMUCOSAL | Status: DC
  Administered 2021-10-18 – 2021-10-23 (×21): 500000 [IU] via ORAL
  Filled 2021-10-18 (×22): qty 5

## 2021-10-18 MED ORDER — FLUCONAZOLE 100 MG PO TABS
200.0000 mg | ORAL_TABLET | Freq: Every day | ORAL | Status: DC
  Administered 2021-10-18 – 2021-10-23 (×6): 200 mg via ORAL
  Filled 2021-10-18 (×6): qty 2

## 2021-10-18 MED ORDER — PANTOPRAZOLE SODIUM 40 MG IV SOLR
40.0000 mg | INTRAVENOUS | Status: DC
  Administered 2021-10-18 – 2021-10-19 (×2): 40 mg via INTRAVENOUS
  Filled 2021-10-18 (×2): qty 10

## 2021-10-18 MED ORDER — SODIUM CHLORIDE 0.9% IV SOLUTION: Freq: Once | INTRAVENOUS | Status: AC

## 2021-10-18 NOTE — Progress Notes (Signed)
PHARMACY CONSULT NOTE - FOLLOW UP ? ?Pharmacy Consult for Electrolyte Monitoring and Replacement  ? ?Recent Labs: ?Potassium (mmol/L)  ?Date Value  ?10/18/2021 3.8  ? ?Magnesium (mg/dL)  ?Date Value  ?10/18/2021 1.9  ? ?Calcium (mg/dL)  ?Date Value  ?10/18/2021 7.6 (L)  ? ?Albumin (g/dL)  ?Date Value  ?10/16/2021 2.4 (L)  ? ?Phosphorus (mg/dL)  ?Date Value  ?10/18/2021 2.6  ? ?Sodium (mmol/L)  ?Date Value  ?10/18/2021 134 (L)  ? ?Scr: 0.88 > 1.40 ?Corrected Calcium: 9.18 mg/dl ? ?Assessment: ?66 year old female with chief complaint of abdominal pain, generalized weakness, fevers, and chills. Past medical history includes recurrent DVT/PE rivaroxaban, HTN, HLD, DM, Iron Deficiency Anemia, obesity, Endometrial Cancer, dCHF, cellulitis of Left leg, recurrent GI bleed. Recent hospitalization with urosepsis 2/2 Proteus and Enterobacter bacteremia s/p left ureteral stent. Admitted with septic shock and severe urosepsis.  ? ?LR 100 mL/hr. On ceftriaxone for the above ? ?Goal of Therapy:  ?Electrolytes within normal limits ? ?Plan:  ?No electrolytes warranted.  ?Follow up electrolytes in the AM ? ? ?Wynelle Cleveland, PharmD ?Pharmacy Resident  ?10/18/2021 ?9:35 AM ? ? ?

## 2021-10-18 NOTE — Progress Notes (Signed)
? ?CRITICAL CARE PROGRESS NOTE ? ? ? ?Name: Courtney Mack ?MRN: [id number] ?DOB: [birth year] ? ?   ?LOS: 1 ? ? ?SUBJECTIVE FINDINGS & SIGNIFICANT EVENTS  ? ? ?Patient description:  ? ?66 y.o with significant PMH of recurrent DVT/PE (08/2020) and (12/2020) on Xarelto s/p Suction thrombectomy with IVC filter placement, HTN, HLD, DM, Iron Deficiency Anemia, obesity, Endometrial Cancer, dCHF, cellulitis of Left leg, recurrent GI bleed December 2022 (Hgb 3.6), nonbleeding ulcer identified, Xarelto restarted, COVID March 2023 and recent Hospitalization on March 2023  with urosepsis 2/2 Proteus and Enterobacter bacteremia s/p left ureteral stent placed who presented to the ED with c/o abdominal pain, generalized weakness, fevers and chills. ?  ?Patient underwent left ureteroscopy yesterday morning which was negative for obstructive stone. A few hours after stent removal, patient began to c/o of left abdominal pain, malaise, fevers and chills. She presented to the ED for further evaluation. ? ?10/18/21- patient remains hypotensive on vasopressor support, she is severely edematous and with trending upward BNP thus IVF has been paused for now, due to severe symptomatic anemia she is receiving pRBC transfusion.  She remains critically ill but appears to be improving.  ? ?Lines/tubes ?: ?CVC Triple Lumen 10/17/21 Right Internal jugular (Active)  ?Indication for Insertion or Continuance of Line Vasoactive infusions 10/17/21 2000  ?Site Assessment Clean, Dry, Intact 10/17/21 2000  ?Proximal Lumen Status Infusing;Flushed;Blood return noted 10/17/21 2000  ?Medial Lumen Status Infusing;Flushed;Blood return noted 10/17/21 2000  ?Distal Lumen Status Saline locked 10/17/21 2000  ?Dressing Type Transparent 10/17/21 2000  ?Dressing Status Antimicrobial disc in place  10/17/21 2000  ?Line Care Connections checked and tightened 10/17/21 2000  ?Dressing Change Due 10/24/21 10/17/21 2000  ?   ?Ureteral Drain/Stent Left ureter 6 Fr. (Active)  ?Site Assessment Clean, Dry, Intact 10/17/21 0408  ?   ?External Urinary Catheter (Active)  ?Psychologist, sport and exercise Dedicated Suction Canister 10/17/21 2000  ?Suction (Verified suction is between 40-80 mmHg) Yes 10/17/21 2000  ?Securement Method Other (Comment) 10/17/21 2000  ?Site Assessment Clean, Dry, Intact 10/17/21 2000  ?Intervention Female External Urinary Catheter Replaced 10/18/21 0300  ?Output (mL) 50 mL 10/18/21 0300  ? ? ?Microbiology/Sepsis markers: ?Results for orders placed or performed during the hospital encounter of 10/16/21  ?Resp Panel by RT-PCR (Flu A&B, Covid) Nasopharyngeal Swab     Status: None  ? Collection Time: 10/16/21 10:35 PM  ? Specimen: Nasopharyngeal Swab; Nasopharyngeal(NP) swabs in vial transport medium  ?Result Value Ref Range Status  ? SARS Coronavirus 2 by RT PCR NEGATIVE NEGATIVE Final  ?  Comment: (NOTE) ?SARS-CoV-2 target nucleic acids are NOT DETECTED. ? ?The SARS-CoV-2 RNA is generally detectable in upper respiratory ?specimens during the acute phase of infection. The lowest ?concentration of SARS-CoV-2 viral copies this assay can detect is ?138 copies/mL. A negative result does not preclude SARS-Cov-2 ?infection and should not be used as the sole basis for treatment or ?other patient management decisions. A negative result may occur with  ?improper specimen collection/handling, submission of specimen other ?than nasopharyngeal swab, presence of viral mutation(s) within the ?areas targeted by this assay, and inadequate number of viral ?copies(<138 copies/mL). A negative result must be combined with ?clinical observations, patient history, and epidemiological ?information. The expected result is Negative. ? ?Fact Sheet for Patients:  ?EntrepreneurPulse.com.au ? ?Fact Sheet for Healthcare  Providers:  ?IncredibleEmployment.be ? ?This test is no t yet approved or cleared by the [place] FDA and  ?has been authorized for detection and/or diagnosis of SARS-CoV-2  by ?FDA under an Emergency Use Authorization (EUA). This EUA will remain  ?in effect (meaning this test can be used) for the duration of the ?COVID-19 declaration under Section 564(b)(1) of the Act, 21 ?U.S.C.section 360bbb-3(b)(1), unless the authorization is terminated  ?or revoked sooner.  ? ? ?  ? Influenza A by PCR NEGATIVE NEGATIVE Final  ? Influenza B by PCR NEGATIVE NEGATIVE Final  ?  Comment: (NOTE) ?The Xpert Xpress SARS-CoV-2/FLU/RSV plus assay is intended as an aid ?in the diagnosis of influenza from Nasopharyngeal swab specimens and ?should not be used as a sole basis for treatment. Nasal washings and ?aspirates are unacceptable for Xpert Xpress SARS-CoV-2/FLU/RSV ?testing. ? ?Fact Sheet for Patients: ?EntrepreneurPulse.com.au ? ?Fact Sheet for Healthcare Providers: ?IncredibleEmployment.be ? ?This test is not yet approved or cleared by the [place] FDA and ?has been authorized for detection and/or diagnosis of SARS-CoV-2 by ?FDA under an Emergency Use Authorization (EUA). This EUA will remain ?in effect (meaning this test can be used) for the duration of the ?COVID-19 declaration under Section 564(b)(1) of the Act, 21 U.S.C. ?section 360bbb-3(b)(1), unless the authorization is terminated or ?revoked. ? ?Performed at [facility], [place], ?[place] [address] ?  ?Blood Culture (routine x 2)     Status: None (Preliminary result)  ? Collection Time: 10/16/21 10:37 PM  ? Specimen: BLOOD  ?Result Value Ref Range Status  ? Specimen Description BLOOD RIGHT FOREARM  Final  ? Special Requests   Final  ?  BOTTLES DRAWN AEROBIC AND ANAEROBIC Blood Culture adequate volume  ? Culture   Final  ?  NO GROWTH < 12 HOURS ?Performed at [facility], [address] [place], [place] [address] ?  ? Report Status PENDING  Incomplete  ?Blood Culture (routine x 2)     Status: None (Preliminary result)  ? Collection Time: 10/16/21 10:37 PM  ? Specimen: BLOOD  ?Result Value Ref Range Status  ? Specimen Description BLOOD LEFT WRIST  Final  ? Special Requests   Final  ?  BOTTLES DRAWN AEROBIC AND ANAEROBIC Blood Culture adequate volume  ? Culture   Final  ?  NO GROWTH < 12 HOURS ?Performed at [facility], [address], [place], [place] [address] ?  ? Report Status PENDING  Incomplete  ?Anaerobic culture w Gram Stain     Status: None (Preliminary result)  ? Collection Time: 10/17/21 12:29 AM  ? Specimen: Fluid  ?Result Value Ref Range Status  ? Specimen Description   Final  ?  FLUID ?Performed at [facility], [address], [place], [place] [address] ?  ? Special Requests LEFT RENAL PELVIS  Final  ? Gram Stain   Final  ?  WBC PRESENT,BOTH PMN AND MONONUCLEAR ?NO ORGANISMS SEEN ?CYTOSPIN SMEAR ?Performed at [facility], [address] [place], [place] [address] ?  ? Culture PENDING  Incomplete  ? Report Status PENDING  Incomplete  ? ? ?Anti-infectives:  ?Anti-infectives (From admission, onward)  ? ? Start     Dose/Rate Route Frequency Ordered Stop  ? 10/17/21 0600  cefTRIAXone (ROCEPHIN) 2 g in sodium chloride 0.9 % 100 mL IVPB       ? 2 g ?200 mL/hr over 30 Minutes Intravenous Every 24 hours 10/17/21 0303    ? 10/16/21 2245  ceFEPIme (MAXIPIME) 2 g in sodium chloride 0.9 % 100 mL IVPB       ? 2 g ?200 mL/hr over 30 Minutes  Intravenous  Once 10/16/21 2232 10/16/21 2345  ? 10/16/21 2245  metroNIDAZOLE (FLAGYL) IVPB 500 mg       ? 500 mg ?100 mL/hr over 60 Minutes Intravenous  Once 10/16/21 2232 10/17/21 0044  ? 10/16/21 2245  vancomycin (VANCOCIN) IVPB 1000 mg/200 mL premix  Status:  Discontinued       ? 1,000 mg ?200 mL/hr over 60 Minutes Intravenous  Once 10/16/21 2232 10/16/21 2236  ? 10/16/21 2245  vancomycin (VANCOREADY) IVPB 2000 mg/400  mL       ?See Hyperspace for full Linked Orders Report.  ? 2,000 mg ?200 mL/hr over 120 Minutes Intravenous  Once 10/16/21 2237 10/17/21 0244  ? 10/16/21 2245  vancomycin (VANCOREADY) IVPB 500 mg/100 mL  St

## 2021-10-18 NOTE — Progress Notes (Signed)
Hemoglobin noted to be 6.9 this morning. NP placed order for transfusion of 1 unit PRBCs. Type and screen ordered and collected - currently awaiting results. Patient is resting comfortably. Will continue to monitor. ? ?Cameron Ali, RN ?

## 2021-10-18 NOTE — TOC Initial Note (Signed)
Transition of Care (TOC) - Initial/Assessment Note  ? ? ?Patient Details  ?Name: Courtney Mack ?MRN: [id number] ?Date of Birth: 01-23-1956 ? ?Transition of Care (TOC) CM/SW Contact:    ?Shelbie Hutching, RN ?Phone Number: ?10/18/2021, 11:28 AM ? ?Clinical Narrative:     ? ?Patient recently discharged from hospital and followed up outpatient with urology for cystoscopy, patient came into the emergency room that night with symptoms of sepsis.   ?          ?Patient is from home with daughter and granddaughter.  ?PCP is Princella Ion. Patient has a RW, wheelchair, lift, and bedside commode at home.  ?Confirmed with Corene Cornea with [facility], patient is active with them for PT. ?TOC to follow for additional needs.        ?  ? ?Expected Discharge Plan: [place] ?Barriers to Discharge: Continued Medical Work up ? ? ?Patient Goals and CMS Choice ?  ?CMS Medicare.gov Compare Post Acute Care list provided to:: Patient ?Choice offered to / list presented to : Patient, Adult Children ? ?Expected Discharge Plan and Services ?Expected Discharge Plan: [place] ?  ?Discharge Planning Services: CM Consult ?Post Acute Care Choice: Home Health, Resumption of Svcs/PTA Provider ?Living arrangements for the past 2 months: [place] ?                ?DME Arranged: N/A ?DME Agency: NA ?  ?  ?  ?HH Arranged: PT, RN, OT ?[place] Agency: [facility] ?Date HH Agency Contacted: 10/18/21 ?Time [place]: [id number] ?Representative spoke with at [place]: Corene Cornea ? ?Prior Living Arrangements/Services ?Living arrangements for the past 2 months: [place] ?Lives with:: Adult Children, Relatives ?Patient language and need for interpreter reviewed:: Yes ?Do you feel safe going back to the place where you live?: Yes      ?Need for Family Participation in Patient Care: Yes (Comment) ?Care giver support system in place?: Yes (comment) (daughter) ?Current home  services: DME, Home PT ?Criminal Activity/Legal Involvement Pertinent to Current Situation/Hospitalization: No - Comment as needed ? ?Activities of Daily Living ?Home Assistive Devices/Equipment: Gilford Rile (specify type) ?ADL Screening (condition at time of admission) ?Patient's cognitive ability adequate to safely complete daily activities?: No ?Is the patient deaf or have difficulty hearing?: Yes ?Does the patient have difficulty seeing, even when wearing glasses/contacts?: Yes (cataracts) ?Does the patient have difficulty concentrating, remembering, or making decisions?: No ?Patient able to express need for assistance with ADLs?: Yes ?Does the patient have difficulty dressing or bathing?: No ?Independently performs ADLs?: Yes (appropriate for developmental age) ?Does the patient have difficulty walking or climbing stairs?: Yes ?Weakness of Legs: Both ?Weakness of Arms/Hands: None ? ?Permission Sought/Granted ?Permission sought to share information with : Case Manager, Family Supports, Other (comment) ?Permission granted to share information with : Yes, Verbal Permission Granted ? Share Information with NAME: Eula Flax ? Permission granted to share info w AGENCY: [facility] ? Permission granted to share info w Relationship: daughter ? Permission granted to share info w Contact Information: [phone] ? ?Emotional Assessment ?Appearance:: Appears older than stated age ?  ?  ?Orientation: : Oriented to Self, Oriented to Place, Oriented to  Time, Oriented to Situation ?Alcohol / Substance Use: Not Applicable ?Psych Involvement: No (comment) ? ?Admission diagnosis:  Sepsis with hypotension ([place]) [A41.9, I95.9] ?Hydronephrosis, unspecified hydronephrosis type [N13.30] ?Sepsis, due to unspecified organism, unspecified whether acute organ dysfunction present ([place]) [A41.9] ?  Patient Active Problem List  ? Diagnosis Date Noted  ? Sepsis with hypotension ([place]) 10/17/2021  ? Gastric antral vascular ectasia   ? Anemia due to  GI blood loss, symptomatic with fatigue/lightheadedness and shortness of breath 10/12/2021  ? Thrombocytopenia ([place]) 09/13/2021  ? Anemia 09/13/2021  ? Acute on chronic blood loss anemia 07/18/2021  ? HLD (hyperlipidemia) 06/20/2021  ? Chronic diastolic CHF (congestive heart failure) ([place]) 06/20/2021  ? Risk factors for obstructive sleep apnea 11/10/2020  ? Uterine cancer ([place]) 11/08/2020  ? IDA (iron deficiency anemia) 09/21/2020  ? Endometrial cancer ([place]) 09/13/2020  ? Acute blood loss anemia 09/06/2020  ? Vaginal bleeding 09/06/2020  ? Anemia, unspecified 08/24/2020  ? Morbidly obese ([place]) 08/23/2020  ? Essential hypertension 08/23/2020  ? Diabetes mellitus without complication ([place]) 07/10/7251  ? Dyslipidemia 08/23/2020  ? Acute respiratory failure with hypoxia ([place])   ? Vaginal bleeding, abnormal 08/18/2020  ? Morbid obesity ([place]) 08/18/2020  ? ?PCP:  [facility] [place] ?Pharmacy:   ?[id number] [address], [address] ?[place] ?[place] [address] ?Phone: [phone] Fax: [phone] ? ? ? ? ?Social Determinants of Health (SDOH) Interventions ?  ? ?Readmission Risk Interventions ? ?  10/18/2021  ? 11:23 AM 10/13/2021  ? 11:34 AM 09/12/2021  ?  5:32 PM  ?Readmission Risk Prevention Plan  ?Transportation Screening Complete Complete Complete  ?Medication Review Press photographer) Complete Complete   ?PCP or Specialist appointment within 3-5 days of discharge Complete Complete   ?[place] or Home Care Consult Complete Complete   ?SW Recovery Care/Counseling Consult Complete Complete   ?Palliative Care Screening Complete Complete Complete  ?Skilled Nursing Facility Complete Complete Complete  ? ? ? ?

## 2021-10-19 LAB — BASIC METABOLIC PANEL
Anion gap: 3 — ABNORMAL LOW (ref 5–15)
BUN: 19 mg/dL (ref 8–23)
CO2: 28 mmol/L (ref 22–32)
Calcium: 7.7 mg/dL — ABNORMAL LOW (ref 8.9–10.3)
Chloride: 105 mmol/L (ref 98–111)
Creatinine, Ser: 0.97 mg/dL (ref 0.44–1.00)
GFR, Estimated: >60 mL/min (ref >60)
Glucose, Bld: 105 mg/dL — ABNORMAL HIGH (ref 70–99)
Potassium: 3.6 mmol/L (ref 3.5–5.1)
Sodium: 136 mmol/L (ref 135–145)

## 2021-10-19 LAB — CBC
HCT: 25.2 % — ABNORMAL LOW (ref 36.0–46.0)
Hemoglobin: 7.7 g/dL — ABNORMAL LOW (ref 12.0–15.0)
MCH: 25.3 pg — ABNORMAL LOW (ref 26.0–34.0)
MCHC: 30.6 g/dL (ref 30.0–36.0)
MCV: 82.9 fL (ref 80.0–100.0)
Platelets: 218 10*3/uL (ref 150–400)
RBC: 3.04 MIL/uL — ABNORMAL LOW (ref 3.87–5.11)
RDW: 18 % — ABNORMAL HIGH (ref 11.5–15.5)
WBC: 6.8 10*3/uL (ref 4.0–10.5)
nRBC: 0 % (ref 0.0–0.2)

## 2021-10-19 LAB — PROCALCITONIN: Procalcitonin: 36.43 ng/mL

## 2021-10-19 LAB — PHOSPHORUS: Phosphorus: 2.1 mg/dL — ABNORMAL LOW (ref 2.5–4.6)

## 2021-10-19 LAB — GLUCOSE, CAPILLARY
Glucose-Capillary: 98 mg/dL (ref 70–99)
Glucose-Capillary: 110 mg/dL — ABNORMAL HIGH (ref 70–99)
Glucose-Capillary: 95 mg/dL (ref 70–99)
Glucose-Capillary: 106 mg/dL — ABNORMAL HIGH (ref 70–99)

## 2021-10-19 LAB — MAGNESIUM: Magnesium: 1.7 mg/dL (ref 1.7–2.4)

## 2021-10-19 MED ORDER — FUROSEMIDE 20 MG PO TABS
20.0000 mg | ORAL_TABLET | Freq: Once | ORAL | Status: AC
  Administered 2021-10-19: 20 mg via ORAL
  Filled 2021-10-19: qty 1

## 2021-10-19 MED ORDER — CEPHALEXIN 500 MG PO CAPS
500.0000 mg | ORAL_CAPSULE | Freq: Four times a day (QID) | ORAL | Status: DC
Start: 2021-10-19 — End: 2021-10-23
  Administered 2021-10-19 – 2021-10-23 (×17): 500 mg via ORAL
  Filled 2021-10-19 (×18): qty 1

## 2021-10-19 MED ORDER — LOPERAMIDE HCL 2 MG PO CAPS
2.0000 mg | ORAL_CAPSULE | Freq: Once | ORAL | Status: AC
  Administered 2021-10-19: 2 mg via ORAL
  Filled 2021-10-19: qty 1

## 2021-10-19 MED ORDER — PANTOPRAZOLE SODIUM 40 MG PO TBEC
40.0000 mg | DELAYED_RELEASE_TABLET | Freq: Every day | ORAL | Status: DC
  Administered 2021-10-20 – 2021-10-23 (×4): 40 mg via ORAL
  Filled 2021-10-19 (×4): qty 1

## 2021-10-19 MED ORDER — MAGNESIUM SULFATE 2 GM/50ML IV SOLN
2.0000 g | Freq: Once | INTRAVENOUS | Status: AC
  Administered 2021-10-19: 2 g via INTRAVENOUS
  Filled 2021-10-19: qty 50

## 2021-10-19 MED ORDER — POTASSIUM & SODIUM PHOSPHATES 280-160-250 MG PO PACK
2.0000 | PACK | Freq: Once | ORAL | Status: AC
  Administered 2021-10-19: 2 via ORAL
  Filled 2021-10-19: qty 2

## 2021-10-19 MED ORDER — HEPARIN SODIUM (PORCINE) 5000 UNIT/ML IJ SOLN
5000.0000 [IU] | Freq: Three times a day (TID) | INTRAMUSCULAR | Status: DC
  Administered 2021-10-19 – 2021-10-21 (×6): 5000 [IU] via SUBCUTANEOUS
  Filled 2021-10-19 (×6): qty 1

## 2021-10-19 NOTE — Progress Notes (Signed)
? ?CRITICAL CARE PROGRESS NOTE ? ? ? ?Name: Courtney Mack ?MRN: [id number] ?DOB: [birth year] ? ?   ?LOS: 2 ? ? ?SUBJECTIVE FINDINGS & SIGNIFICANT EVENTS  ? ? ?Patient description:  ? ?66 y.o with significant PMH of recurrent DVT/PE (08/2020) and (12/2020) on Xarelto s/p Suction thrombectomy with IVC filter placement, HTN, HLD, DM, Iron Deficiency Anemia, obesity, Endometrial Cancer, dCHF, cellulitis of Left leg, recurrent GI bleed December 2022 (Hgb 3.6), nonbleeding ulcer identified, Xarelto restarted, COVID March 2023 and recent Hospitalization on March 2023  with urosepsis 2/2 Proteus and Enterobacter bacteremia s/p left ureteral stent placed who presented to the ED with c/o abdominal pain, generalized weakness, fevers and chills. ?  ?Patient underwent left ureteroscopy yesterday morning which was negative for obstructive stone. A few hours after stent removal, patient began to c/o of left abdominal pain, malaise, fevers and chills. She presented to the ED for further evaluation. ? ?10/18/21- patient remains hypotensive on vasopressor support, she is severely edematous and with trending upward BNP thus IVF has been paused for now, due to severe symptomatic anemia she is receiving pRBC transfusion.  She remains critically ill but appears to be improving.  ? ?10/19/21- patient off vasopressor support with borderline low BP, renal function is stabalizing. Will optimize for TRH.  Starting low dose lasix challenge and continuing diflucan for candida esophagitis.  ? ?Lines/tubes ?: ?CVC Triple Lumen 10/17/21 Right Internal jugular (Active)  ?Indication for Insertion or Continuance of Line Vasoactive infusions 10/17/21 2000  ?Site Assessment Clean, Dry, Intact 10/17/21 2000  ?Proximal Lumen Status Infusing;Flushed;Blood return noted 10/17/21 2000   ?Medial Lumen Status Infusing;Flushed;Blood return noted 10/17/21 2000  ?Distal Lumen Status Saline locked 10/17/21 2000  ?Dressing Type Transparent 10/17/21 2000  ?Dressing Status Antimicrobial disc in place 10/17/21 2000  ?Line Care Connections checked and tightened 10/17/21 2000  ?Dressing Change Due 10/24/21 10/17/21 2000  ?   ?Ureteral Drain/Stent Left ureter 6 Fr. (Active)  ?Site Assessment Clean, Dry, Intact 10/17/21 0408  ?   ?External Urinary Catheter (Active)  ?Psychologist, sport and exercise Dedicated Suction Canister 10/17/21 2000  ?Suction (Verified suction is between 40-80 mmHg) Yes 10/17/21 2000  ?Securement Method Other (Comment) 10/17/21 2000  ?Site Assessment Clean, Dry, Intact 10/17/21 2000  ?Intervention Female External Urinary Catheter Replaced 10/18/21 0300  ?Output (mL) 50 mL 10/18/21 0300  ? ? ?Microbiology/Sepsis markers: ?Results for orders placed or performed during the hospital encounter of 10/16/21  ?Resp Panel by RT-PCR (Flu A&B, Covid) Nasopharyngeal Swab     Status: None  ? Collection Time: 10/16/21 10:35 PM  ? Specimen: Nasopharyngeal Swab; Nasopharyngeal(NP) swabs in vial transport medium  ?Result Value Ref Range Status  ? SARS Coronavirus 2 by RT PCR NEGATIVE NEGATIVE Final  ?  Comment: (NOTE) ?SARS-CoV-2 target nucleic acids are NOT DETECTED. ? ?The SARS-CoV-2 RNA is generally detectable in upper respiratory ?specimens during the acute phase of infection. The lowest ?concentration of SARS-CoV-2 viral copies this assay can detect is ?138 copies/mL. A negative result does not preclude SARS-Cov-2 ?infection and should not be used as the sole basis for treatment or ?other patient management decisions. A negative result may occur with  ?improper specimen collection/handling, submission of specimen other ?than nasopharyngeal swab, presence of viral mutation(s) within the ?areas targeted by this assay, and inadequate number of viral ?copies(<138 copies/mL). A negative result must be combined  with ?clinical observations, patient history, and epidemiological ?information. The expected result is Negative. ? ?Fact Sheet for Patients:  ?EntrepreneurPulse.com.au ? ?Fact Sheet  for Healthcare Providers:  ?IncredibleEmployment.be ? ?This test is no t yet approved or cleared by the [place] FDA and  ?has been authorized for detection and/or diagnosis of SARS-CoV-2 by ?FDA under an Emergency Use Authorization (EUA). This EUA will remain  ?in effect (meaning this test can be used) for the duration of the ?COVID-19 declaration under Section 564(b)(1) of the Act, 21 ?U.S.C.section 360bbb-3(b)(1), unless the authorization is terminated  ?or revoked sooner.  ? ? ?  ? Influenza A by PCR NEGATIVE NEGATIVE Final  ? Influenza B by PCR NEGATIVE NEGATIVE Final  ?  Comment: (NOTE) ?The Xpert Xpress SARS-CoV-2/FLU/RSV plus assay is intended as an aid ?in the diagnosis of influenza from Nasopharyngeal swab specimens and ?should not be used as a sole basis for treatment. Nasal washings and ?aspirates are unacceptable for Xpert Xpress SARS-CoV-2/FLU/RSV ?testing. ? ?Fact Sheet for Patients: ?EntrepreneurPulse.com.au ? ?Fact Sheet for Healthcare Providers: ?IncredibleEmployment.be ? ?This test is not yet approved or cleared by the [place] FDA and ?has been authorized for detection and/or diagnosis of SARS-CoV-2 by ?FDA under an Emergency Use Authorization (EUA). This EUA will remain ?in effect (meaning this test can be used) for the duration of the ?COVID-19 declaration under Section 564(b)(1) of the Act, 21 U.S.C. ?section 360bbb-3(b)(1), unless the authorization is terminated or ?revoked. ? ?Performed at [facility], [place], ?[place] [address] ?  ?Blood Culture (routine x 2)     Status: None (Preliminary result)  ? Collection Time: 10/16/21 10:37 PM  ? Specimen: BLOOD  ?Result Value Ref Range Status  ? Specimen  Description BLOOD RIGHT FOREARM  Final  ? Special Requests   Final  ?  BOTTLES DRAWN AEROBIC AND ANAEROBIC Blood Culture adequate volume  ? Culture   Final  ?  NO GROWTH 3 DAYS ?Performed at [facility], [address], [place], [place] [address] ?  ? Report Status PENDING  Incomplete  ?Blood Culture (routine x 2)     Status: None (Preliminary result)  ? Collection Time: 10/16/21 10:37 PM  ? Specimen: BLOOD  ?Result Value Ref Range Status  ? Specimen Description BLOOD LEFT WRIST  Final  ? Special Requests   Final  ?  BOTTLES DRAWN AEROBIC AND ANAEROBIC Blood Culture adequate volume  ? Culture   Final  ?  NO GROWTH 3 DAYS ?Performed at [facility], [address], [place], [place] [address] ?  ? Report Status PENDING  Incomplete  ?Urine Culture     Status: None  ? Collection Time: 10/17/21 12:29 AM  ? Specimen: Urine, Catheterized  ?Result Value Ref Range Status  ? Specimen Description   Final  ?  URINE, CATHETERIZED ?Performed at [facility], [address] [place], [place] [address] ?  ? Special Requests   Final  ?  NONE ?Performed at [facility], [address] [place], [place] [address] ?  ? Culture   Final  ?  NO GROWTH ?Performed at [facility], [place] [address] [place], [place] [address] ?  ? Report Status 10/18/2021 FINAL  Final  ?Anaerobic culture w Gram Stain     Status: None (Preliminary result)  ? Collection Time: 10/17/21 12:29 AM  ? Specimen: Fluid  ?Result Value Ref Range Status  ? Specimen Description   Final  ?  FLUID ?Performed at [facility], [address], [place], [place] [address] ?  ? Special Requests LEFT RENAL PELVIS  Final  ?  Gram Stain   Final  ?  WBC PRESENT,BOTH PMN AND MONONUCLEAR ?NO ORGANISMS SEEN ?CYTOSPIN SMEAR ?Performed at [facility], [place] [address] [place], [place] [address] ?  ? Culture PENDING  Incomplete  ? Report Status PENDING  Incomplete  ? ? ?Anti-infectives:  ?Anti-infectives (From admission, onward)   ? ? Start     Dose/Rate Route Frequency Ordered Stop  ? 10/18/21 1415  fluconazole (DIFLUCAN) tablet 200 mg       ? 200 mg Oral Daily 10/18/21 1321    ? 10/17/21 0600  cefTRIAXone (ROCEPHIN) 2 g in sodium chloride 0.9 %

## 2021-10-19 NOTE — Progress Notes (Signed)
PHARMACIST - PHYSICIAN COMMUNICATION ? ?CONCERNING: IV to Oral Route Change Policy ? ?RECOMMENDATION: ?This patient is receiving pantoprazole by the intravenous route.  Based on criteria approved by the Pharmacy and Therapeutics Committee, the intravenous medication(s) is/are being converted to the equivalent oral dose form(s). ? ? ?DESCRIPTION: ?These criteria include: ?The patient is eating (either orally or via tube) and/or has been taking other orally administered medications for a least 24 hours ?The patient has no evidence of active gastrointestinal bleeding or impaired GI absorption (gastrectomy, short bowel, patient on TNA or NPO). ? ?If you have questions about this conversion, please contact the Pharmacy Department  ? ?[name], RPH ?10/19/2021 3:26 PM  ?

## 2021-10-19 NOTE — Progress Notes (Addendum)
PHARMACY CONSULT NOTE - FOLLOW UP ? ?Pharmacy Consult for Electrolyte Monitoring and Replacement  ? ?Recent Labs: ?Potassium (mmol/L)  ?Date Value  ?10/19/2021 3.6  ? ?Magnesium (mg/dL)  ?Date Value  ?10/19/2021 1.7  ? ?Calcium (mg/dL)  ?Date Value  ?10/19/2021 7.7 (L)  ? ?Albumin (g/dL)  ?Date Value  ?10/16/2021 2.4 (L)  ? ?Phosphorus (mg/dL)  ?Date Value  ?10/19/2021 2.1 (L)  ? ?Sodium (mmol/L)  ?Date Value  ?10/19/2021 136  ? ?Scr: 0.88 > 1.40 ?Corrected Calcium: 9.18 mg/dl ? ?Assessment: ?66 year old female with chief complaint of abdominal pain, generalized weakness, fevers, and chills. Past medical history includes recurrent DVT/PE rivaroxaban, HTN, HLD, DM, Iron Deficiency Anemia, obesity, Endometrial Cancer, dCHF, cellulitis of Left leg, recurrent GI bleed. Recent hospitalization with urosepsis 2/2 Proteus and Enterobacter bacteremia s/p left ureteral stent. Admitted with septic shock and severe urosepsis.  ? ?On ceftriaxone for the above ?Feeds started on 4/12 ? ?Goal of Therapy:  ?Electrolytes within normal limits ? ?Plan:  ?Phosphorus 2.1. Give Phos-Nak 2 packets x 1.  ?Mag 1.7. Give magnesium sulfate 2 grams x 1. ?Follow up electrolytes in the AM ? ?Wynelle Cleveland, PharmD ?Pharmacy Resident  ?10/19/2021 ?8:08 AM ? ? ?

## 2021-10-20 DIAGNOSIS — I5032 Chronic diastolic (congestive) heart failure: Secondary | ICD-10-CM

## 2021-10-20 DIAGNOSIS — J9601 Acute respiratory failure with hypoxia: Secondary | ICD-10-CM | POA: Diagnosis not present

## 2021-10-20 DIAGNOSIS — A419 Sepsis, unspecified organism: Secondary | ICD-10-CM | POA: Diagnosis not present

## 2021-10-20 DIAGNOSIS — I959 Hypotension, unspecified: Secondary | ICD-10-CM | POA: Diagnosis not present

## 2021-10-20 LAB — BASIC METABOLIC PANEL
Anion gap: 5 (ref 5–15)
BUN: 17 mg/dL (ref 8–23)
CO2: 29 mmol/L (ref 22–32)
Calcium: 8.1 mg/dL — ABNORMAL LOW (ref 8.9–10.3)
Chloride: 102 mmol/L (ref 98–111)
Creatinine, Ser: 0.96 mg/dL (ref 0.44–1.00)
GFR, Estimated: >60 mL/min (ref >60)
Glucose, Bld: 143 mg/dL — ABNORMAL HIGH (ref 70–99)
Potassium: 3.5 mmol/L (ref 3.5–5.1)
Sodium: 136 mmol/L (ref 135–145)

## 2021-10-20 LAB — CBC
HCT: 26 % — ABNORMAL LOW (ref 36.0–46.0)
Hemoglobin: 7.8 g/dL — ABNORMAL LOW (ref 12.0–15.0)
MCH: 25.1 pg — ABNORMAL LOW (ref 26.0–34.0)
MCHC: 30 g/dL (ref 30.0–36.0)
MCV: 83.6 fL (ref 80.0–100.0)
Platelets: 213 10*3/uL (ref 150–400)
RBC: 3.11 MIL/uL — ABNORMAL LOW (ref 3.87–5.11)
RDW: 18.5 % — ABNORMAL HIGH (ref 11.5–15.5)
WBC: 4.8 10*3/uL (ref 4.0–10.5)
nRBC: 0 % (ref 0.0–0.2)

## 2021-10-20 LAB — GLUCOSE, CAPILLARY
Glucose-Capillary: 115 mg/dL — ABNORMAL HIGH (ref 70–99)
Glucose-Capillary: 148 mg/dL — ABNORMAL HIGH (ref 70–99)
Glucose-Capillary: 139 mg/dL — ABNORMAL HIGH (ref 70–99)
Glucose-Capillary: 109 mg/dL — ABNORMAL HIGH (ref 70–99)

## 2021-10-20 LAB — PHOSPHORUS: Phosphorus: 2.3 mg/dL — ABNORMAL LOW (ref 2.5–4.6)

## 2021-10-20 LAB — MAGNESIUM: Magnesium: 1.8 mg/dL (ref 1.7–2.4)

## 2021-10-20 MED ORDER — POTASSIUM PHOSPHATES 15 MMOLE/5ML IV SOLN
15.0000 mmol | Freq: Once | INTRAVENOUS | Status: AC
  Administered 2021-10-20: 15 mmol via INTRAVENOUS
  Filled 2021-10-20: qty 5

## 2021-10-20 MED ORDER — SODIUM CHLORIDE 0.9% FLUSH
10.0000 mL | INTRAVENOUS | Status: DC | PRN
  Administered 2021-10-20: 10 mL

## 2021-10-20 MED ORDER — SODIUM CHLORIDE 0.9% FLUSH
10.0000 mL | Freq: Two times a day (BID) | INTRAVENOUS | Status: DC
  Administered 2021-10-20 – 2021-10-23 (×7): 10 mL

## 2021-10-20 MED ORDER — POTASSIUM CHLORIDE CRYS ER 20 MEQ PO TBCR
20.0000 meq | EXTENDED_RELEASE_TABLET | Freq: Once | ORAL | Status: AC
  Administered 2021-10-20: 20 meq via ORAL
  Filled 2021-10-20: qty 1

## 2021-10-20 MED ORDER — MAGNESIUM SULFATE 2 GM/50ML IV SOLN
2.0000 g | Freq: Once | INTRAVENOUS | Status: AC
  Administered 2021-10-20: 2 g via INTRAVENOUS
  Filled 2021-10-20: qty 50

## 2021-10-20 NOTE — Progress Notes (Addendum)
PHARMACY CONSULT NOTE - FOLLOW UP ? ?Pharmacy Consult for Electrolyte Monitoring and Replacement  ? ?Recent Labs: ?Potassium (mmol/L)  ?Date Value  ?10/20/2021 3.5  ? ?Magnesium (mg/dL)  ?Date Value  ?10/20/2021 1.8  ? ?Calcium (mg/dL)  ?Date Value  ?10/20/2021 8.1 (L)  ? ?Albumin (g/dL)  ?Date Value  ?10/16/2021 2.4 (L)  ? ?Phosphorus (mg/dL)  ?Date Value  ?10/20/2021 2.3 (L)  ? ?Sodium (mmol/L)  ?Date Value  ?10/20/2021 136  ? ?Scr: 0.88 > 1.40 ?Corrected Calcium: 9.18 mg/dl ? ?Assessment: ?66 year old female with chief complaint of abdominal pain, generalized weakness, fevers, and chills. Past medical history includes recurrent DVT/PE rivaroxaban, HTN, HLD, DM, Iron Deficiency Anemia, obesity, Endometrial Cancer, dCHF, cellulitis of Left leg, recurrent GI bleed. Recent hospitalization with urosepsis 2/2 Proteus and Enterobacter bacteremia s/p left ureteral stent. Admitted with septic shock and severe urosepsis.  ? ?On Keflex for the above ?Feeds started on 4/12 ? ?Goal of Therapy:  ?Electrolytes within normal limits ? ?Plan:  ?Phosphorus 2.1>2.3 s/p Phos-Nak 2 packets x 1 ?IV KPHos 15 mmol ordered by provider ?Follow up electrolytes in the AM ? ?Dorothe Pea, PharmD, BCPS ?Clinical Pharmacist   ?10/20/2021 ?10:06 AM ? ? ?

## 2021-10-20 NOTE — Assessment & Plan Note (Signed)
Due to urinary source.Switched Rocephin->Keflex. Urine has no growth ?

## 2021-10-20 NOTE — Assessment & Plan Note (Signed)
On 2 liters. Wean as able ?

## 2021-10-20 NOTE — Progress Notes (Signed)
Removed RIJ per order... Pt placed in flat [place] position before removing central line.... placed an occlusive petroleum dressing, 4x4 gauze and Tegaderm over.... instructed pt to remain flat for 30 min and education given.... call bell at hands reach.... pt verb understanding.   ?

## 2021-10-20 NOTE — Progress Notes (Signed)
? ?CRITICAL CARE PROGRESS NOTE ? ? ? ?Name: Courtney Mack ?MRN: [id number] ?DOB: [birth year] ? ?   ?LOS: 3 ? ? ?SUBJECTIVE FINDINGS & SIGNIFICANT EVENTS  ? ? ?Patient description:  ? ?65 y.o with significant PMH of recurrent DVT/PE (08/2020) and (12/2020) on Xarelto s/p Suction thrombectomy with IVC filter placement, HTN, HLD, DM, Iron Deficiency Anemia, obesity, Endometrial Cancer, dCHF, cellulitis of Left leg, recurrent GI bleed December 2022 (Hgb 3.6), nonbleeding ulcer identified, Xarelto restarted, COVID March 2023 and recent Hospitalization on March 2023  with urosepsis 2/2 Proteus and Enterobacter bacteremia s/p left ureteral stent placed who presented to the ED with c/o abdominal pain, generalized weakness, fevers and chills. ?  ?Patient underwent left ureteroscopy yesterday morning which was negative for obstructive stone. A few hours after stent removal, patient began to c/o of left abdominal pain, malaise, fevers and chills. She presented to the ED for further evaluation. ? ?10/18/21- patient remains hypotensive on vasopressor support, she is severely edematous and with trending upward BNP thus IVF has been paused for now, due to severe symptomatic anemia she is receiving pRBC transfusion.  She remains critically ill but appears to be improving.  ? ?10/19/21- patient off vasopressor support with borderline low BP, renal function is stabalizing. Will optimize for TRH.  Starting low dose lasix challenge and continuing diflucan for candida esophagitis.  ? ?10/20/21- patient has not been out of bed but is more clinically stable. PT/OT initiating.  She shares urinary discomfort is resolved.  ? ?Lines/tubes ?: ?CVC Triple Lumen 10/17/21 Right Internal jugular (Active)  ?Indication for Insertion or Continuance of Line Vasoactive infusions  10/17/21 2000  ?Site Assessment Clean, Dry, Intact 10/17/21 2000  ?Proximal Lumen Status Infusing;Flushed;Blood return noted 10/17/21 2000  ?Medial Lumen Status Infusing;Flushed;Blood return noted 10/17/21 2000  ?Distal Lumen Status Saline locked 10/17/21 2000  ?Dressing Type Transparent 10/17/21 2000  ?Dressing Status Antimicrobial disc in place 10/17/21 2000  ?Line Care Connections checked and tightened 10/17/21 2000  ?Dressing Change Due 10/24/21 10/17/21 2000  ?   ?Ureteral Drain/Stent Left ureter 6 Fr. (Active)  ?Site Assessment Clean, Dry, Intact 10/17/21 0408  ?   ?External Urinary Catheter (Active)  ?Psychologist, sport and exercise Dedicated Suction Canister 10/17/21 2000  ?Suction (Verified suction is between 40-80 mmHg) Yes 10/17/21 2000  ?Securement Method Other (Comment) 10/17/21 2000  ?Site Assessment Clean, Dry, Intact 10/17/21 2000  ?Intervention Female External Urinary Catheter Replaced 10/18/21 0300  ?Output (mL) 50 mL 10/18/21 0300  ? ? ?Microbiology/Sepsis markers: ?Results for orders placed or performed during the hospital encounter of 10/16/21  ?Resp Panel by RT-PCR (Flu A&B, Covid) Nasopharyngeal Swab     Status: None  ? Collection Time: 10/16/21 10:35 PM  ? Specimen: Nasopharyngeal Swab; Nasopharyngeal(NP) swabs in vial transport medium  ?Result Value Ref Range Status  ? SARS Coronavirus 2 by RT PCR NEGATIVE NEGATIVE Final  ?  Comment: (NOTE) ?SARS-CoV-2 target nucleic acids are NOT DETECTED. ? ?The SARS-CoV-2 RNA is generally detectable in upper respiratory ?specimens during the acute phase of infection. The lowest ?concentration of SARS-CoV-2 viral copies this assay can detect is ?138 copies/mL. A negative result does not preclude SARS-Cov-2 ?infection and should not be used as the sole basis for treatment or ?other patient management decisions. A negative result may occur with  ?improper specimen collection/handling, submission of specimen other ?than nasopharyngeal swab, presence of viral mutation(s)  within the ?areas targeted by this assay, and inadequate number of viral ?copies(<138 copies/mL). A negative result must be  combined with ?clinical observations, patient history, and epidemiological ?information. The expected result is Negative. ? ?Fact Sheet for Patients:  ?EntrepreneurPulse.com.au ? ?Fact Sheet for Healthcare Providers:  ?IncredibleEmployment.be ? ?This test is no t yet approved or cleared by the [place] FDA and  ?has been authorized for detection and/or diagnosis of SARS-CoV-2 by ?FDA under an Emergency Use Authorization (EUA). This EUA will remain  ?in effect (meaning this test can be used) for the duration of the ?COVID-19 declaration under Section 564(b)(1) of the Act, 21 ?U.S.C.section 360bbb-3(b)(1), unless the authorization is terminated  ?or revoked sooner.  ? ? ?  ? Influenza A by PCR NEGATIVE NEGATIVE Final  ? Influenza B by PCR NEGATIVE NEGATIVE Final  ?  Comment: (NOTE) ?The Xpert Xpress SARS-CoV-2/FLU/RSV plus assay is intended as an aid ?in the diagnosis of influenza from Nasopharyngeal swab specimens and ?should not be used as a sole basis for treatment. Nasal washings and ?aspirates are unacceptable for Xpert Xpress SARS-CoV-2/FLU/RSV ?testing. ? ?Fact Sheet for Patients: ?EntrepreneurPulse.com.au ? ?Fact Sheet for Healthcare Providers: ?IncredibleEmployment.be ? ?This test is not yet approved or cleared by the [place] FDA and ?has been authorized for detection and/or diagnosis of SARS-CoV-2 by ?FDA under an Emergency Use Authorization (EUA). This EUA will remain ?in effect (meaning this test can be used) for the duration of the ?COVID-19 declaration under Section 564(b)(1) of the Act, 21 U.S.C. ?section 360bbb-3(b)(1), unless the authorization is terminated or ?revoked. ? ?Performed at [facility], [place], ?[place] [address] ?  ?Blood Culture (routine x 2)     Status: None  (Preliminary result)  ? Collection Time: 10/16/21 10:37 PM  ? Specimen: BLOOD  ?Result Value Ref Range Status  ? Specimen Description BLOOD RIGHT FOREARM  Final  ? Special Requests   Final  ?  BOTTLES DRAWN AEROBIC AND ANAEROBIC Blood Culture adequate volume  ? Culture   Final  ?  NO GROWTH 4 DAYS ?Performed at [facility], [address], [place], [place] [address] ?  ? Report Status PENDING  Incomplete  ?Blood Culture (routine x 2)     Status: None (Preliminary result)  ? Collection Time: 10/16/21 10:37 PM  ? Specimen: BLOOD  ?Result Value Ref Range Status  ? Specimen Description BLOOD LEFT WRIST  Final  ? Special Requests   Final  ?  BOTTLES DRAWN AEROBIC AND ANAEROBIC Blood Culture adequate volume  ? Culture   Final  ?  NO GROWTH 4 DAYS ?Performed at [facility], [address], [place], [place] [address] ?  ? Report Status PENDING  Incomplete  ?Urine Culture     Status: None  ? Collection Time: 10/17/21 12:29 AM  ? Specimen: Urine, Catheterized  ?Result Value Ref Range Status  ? Specimen Description   Final  ?  URINE, CATHETERIZED ?Performed at [facility], [address], [place], [place] [address] ?  ? Special Requests   Final  ?  NONE ?Performed at [facility], [address] [place], [place] [address] ?  ? Culture   Final  ?  NO GROWTH ?Performed at [facility], [place] [address] [place],  [address] ?  ? Report Status 10/18/2021 FINAL  Final  ?Anaerobic culture w Gram Stain     Status: None (Preliminary result)  ? Collection Time: 10/17/21 12:29 AM  ? Specimen: Fluid  ?Result Value Ref Range Status  ? Specimen Description   Final  ?  FLUID ?Performed at [facility], [address], [place], [place] [address] ?  ? Special Requests LEFT RENAL PELVIS  Final  ? Gram Stain   Final  ?  WBC PRESENT,BOTH PMN AND MONONUCLEAR ?NO ORGANISMS SEEN ?CYTOSPIN SMEAR ?Performed at [facility], [place] [address] [place], [place] [address] ?  ? Culture    Final  ?  NO ANAEROBES ISOLATED; CULTURE IN PROGRESS FOR 5 DAYS  ? Report Status PENDING  Incomplete  ? ? ?Anti-infectives:  ?Anti-infectives (From admission, onward)  ? ? Start     Dose/Rate Route [place]

## 2021-10-20 NOTE — Hospital Course (Addendum)
66 y.o with significant PMH of recurrent DVT/PE (08/2020) and (12/2020) on Xarelto s/p Suction thrombectomy with IVC filter placement, HTN, HLD, DM, Iron Deficiency Anemia, obesity, Endometrial Cancer, dCHF, cellulitis of Left leg, recurrent GI bleed December 2022 (Hgb 3.6), nonbleeding ulcer identified, Xarelto restarted, COVID March 2023 and recent Hospitalization on March 2023  with urosepsis 2/2 Proteus and Enterobacter bacteremia s/p left ureteral stent placed who presented to the ED with c/o abdominal pain, generalized weakness, fevers and chills. ? ?Patient underwent left ureteroscopy on 4/12 which was negative for obstructive stone. A few hours after stent removal, patient began to c/o of left abdominal pain, malaise, fevers and chills. She presented to the ED for further evaluation. ?  ?10/18/21- patient remains hypotensive on vasopressor support, she is severely edematous and with trending upward BNP thus IVF has been paused for now, due to severe symptomatic anemia she is receiving pRBC transfusion.  She remains critically ill but appears to be improving.  ?  ?10/19/21- patient off vasopressor support with borderline low BP, renal function is stabalizing. Will optimize for TRH.  Starting low dose lasix challenge and continuing diflucan for candida esophagitis.  ?  ?10/20/21- TRH pick up. Transfer to floors. Off pressors ?4/16: 1 PRBC transfusion for hemoglobin 7.5 ?4/17: PT, OT recommends HHPT/OT.  Low-grade fever ?

## 2021-10-20 NOTE — Evaluation (Signed)
Occupational Therapy Evaluation ?Patient Details ?Name: Courtney Mack ?MRN: [id number] ?DOB: Sep 22, 1955 ?Today's Date: 10/20/2021 ? ? ?History of Present Illness Pt is a 66 y/o F who underwent L ureteroscopy & a few hours after stent removal pt began to c/o L abomdinal pain, malaise, fevers & chills. Pt presented to the ED for further evaluation. Pt was admitted for tx of septic shock & required vasopressor support & required pRBC transfusion. PMH: recurrent DVT/PE on xarelto s/p suction thrombectomy with IVC filter placement, HTN, HLD, DM, iron deficiency anemia, obesity, endometrial CA, dCHF, LLE cellulitis, recurrent GI bleed, nonbleeding ulcer, Covid 09/2021, hospitalization 2/2 urosepsis 09/2021, obesity  ? ?Clinical Impression ?  ?Chart reviewed to date, RN cleard pt for participation in OT evaluation. Co tx completed with PT on this date. PTA pt reports MOD I with UB dressing, grooming ADL tasks, daughter assists with LB dressing and bathing. Daughter also assists with all IADLs as needed. Pt presents with deficits in strength and endurance, affecting optimal ADL completion. Pt required MOD A for clothing management during hygiene, SET UP for seated grooming tasks. Pt performs short ambulatory transfer to [facility] with CGA with RW. Pt endorses she feels weaker than baseline. At current level of function pt would benefit from STR to address functional deficits and to facilitate safe discharge home, return to PLOF. OT will continue to follow acutely.  ?   ? ?Recommendations for follow up therapy are one component of a multi-disciplinary discharge planning process, led by the attending physician.  Recommendations may be updated based on patient status, additional functional criteria and insurance authorization.  ? ?Follow Up Recommendations ? Skilled nursing-short term rehab (<3 hours/day)  ?  ?Assistance Recommended at Discharge Frequent or constant Supervision/Assistance  ?Patient can return home with the following  A little help with walking and/or transfers;A lot of help with bathing/dressing/bathroom;Assistance with cooking/housework;Help with stairs or ramp for entrance ? ?  ?Functional Status Assessment ? Patient has had a recent decline in their functional status and demonstrates the ability to make significant improvements in function in a reasonable and predictable amount of time.  ?Equipment Recommendations ? None recommended by OT  ?  ?Recommendations for Other Services   ? ? ?  ?Precautions / Restrictions Precautions ?Precautions: Fall ?Restrictions ?Weight Bearing Restrictions: No  ? ?  ? ?Mobility Bed Mobility ?  ?Bed Mobility: Supine to Sit, Sit to Supine ?  ?  ?Supine to sit: Min assist, HOB elevated ?Sit to supine: Mod assist ?  ?  ?  ? ?Transfers ?Overall transfer level: Needs assistance ?Equipment used: Rolling walker (2 wheels) ?Transfers: Sit to/from Stand, Bed to chair/wheelchair/BSC ?Sit to Stand: Min guard, From elevated surface ?  ?  ?  ?  ?  ?  ?  ? ?  ?Balance Overall balance assessment: Needs assistance ?Sitting-balance support: Feet supported, Bilateral upper extremity supported ?Sitting balance-Leahy Scale: Fair ?  ?  ?Standing balance support: During functional activity, Single extremity supported ?Standing balance-Leahy Scale: Fair ?  ?  ?  ?  ?  ?  ?  ?  ?  ?  ?  ?  ?   ? ?ADL either performed or assessed with clinical judgement  ? ?ADL Overall ADL's : Needs assistance/impaired ?Eating/Feeding: Set up;Sitting ?  ?Grooming: Wash/dry face;Sitting;Set up ?Grooming Details (indicate cue type and reason): at edge of bed ?  ?  ?  ?  ?  ?  ?Lower Body Dressing: Maximal assistance ?Lower Body Dressing Details (indicate  cue type and reason): seated at edge of bed; pt reports baseline she has assistance ?Toilet Transfer: Stand-pivot;Min guard;BSC/3in1 ?  ?Toileting- Clothing Manipulation and Hygiene: Moderate assistance ?Toileting - Clothing Manipulation Details (indicate cue type and reason): to donn/doff  brief ?  ?  ?Functional mobility during ADLs: Supervision/safety;Min guard;Rolling walker (2 wheels) ?   ? ? ? ?Vision Baseline Vision/History: 1 Wears glasses ?   ?   ?Perception   ?  ?Praxis   ?  ? ?Pertinent Vitals/Pain Pain Assessment ?Pain Assessment: No/denies pain  ? ? ? ?Hand Dominance   ?  ?Extremity/Trunk Assessment Upper Extremity Assessment ?Upper Extremity Assessment: Generalized weakness ?  ?Lower Extremity Assessment ?Lower Extremity Assessment: Generalized weakness ?  ?Cervical / Trunk Assessment ?Cervical / Trunk Assessment: Kyphotic ?  ?Communication Communication ?Communication: No difficulties ?  ?Cognition Arousal/Alertness: Awake/alert ?Behavior During Therapy: [facility] for tasks assessed/performed, Flat affect ?Overall Cognitive Status: Within Functional Limits for tasks assessed ?  ?  ?  ?  ?  ?  ?  ?  ?  ?  ?  ?  ?  ?  ?  ?  ?  ?  ?  ?General Comments  Pt with edema throughout BLE which pt reports is chronic, appears to be weeping from LLE however unable to locate location; vss throughout ? ?  ?Exercises Other Exercises ?Other Exercises: edu re: role of OT, role of rehab, discharge recommendations ?  ?Shoulder Instructions    ? ? ?Home Living Family/patient expects to be discharged to:: Private residence ?Living Arrangements: Children;Other (Comment) (daughter and 5 grand children- 57-21 years old) ?Available Help at Discharge: Family;Available 24 hours/day (per pt daugther works 3rd shift- is available during the day. Older grand children assist when pt daugther is not at home.) ?Type of Home: House ?Home Access: Ramped entrance ?  ?  ?Home Layout: One level ?  ?  ?Bathroom Shower/Tub: Walk-in shower ?  ?  ?  ?  ?Home Equipment: Rollator (4 wheels);BSC/3in1 ?  ?Additional Comments: sleeps in recliner ?  ? ?  ?Prior Functioning/Environment   ?  ?  ?  ?  ?  ?  ?Mobility Comments: amb household distances with rollator ?ADLs Comments: pt daugther assists with LB dressing, bathing; Pt able to perform  UB dressing, grooming with MOD I ?  ? ?  ?  ?OT Problem List: Decreased strength;Decreased activity tolerance;Impaired balance (sitting and/or standing) ?  ?   ?OT Treatment/Interventions: Self-care/ADL training;Therapeutic exercise;Patient/family education;Balance training;Energy conservation;DME and/or AE instruction;Therapeutic activities  ?  ?OT Goals(Current goals can be found in the care plan section) Acute Rehab OT Goals ?Patient Stated Goal: get stronger ?OT Goal Formulation: With patient ?Time For Goal Achievement: 11/03/21 ?Potential to Achieve Goals: Good ?ADL Goals ?Pt Will Perform Grooming: sitting;with set-up ?Pt Will Perform Upper Body Dressing: with set-up;sitting ?Pt Will Perform Lower Body Dressing: with mod assist;sitting/lateral leans ?Pt Will Transfer to Toilet: with supervision;ambulating;bedside commode ?Pt Will Perform Toileting - Clothing Manipulation and hygiene: with supervision;sit to/from stand  ?OT Frequency: Min 2X/week ?  ? ?Co-evaluation PT/OT/SLP Co-Evaluation/Treatment: Yes ?Reason for Co-Treatment: For patient/therapist safety ?PT goals addressed during session: Mobility/safety with mobility;Balance;Proper use of DME ?OT goals addressed during session: ADL's and self-care ?  ? ?  ?AM-PAC OT "6 Clicks" Daily Activity     ?Outcome Measure Help from another person eating meals?: None ?Help from another person taking care of personal grooming?: None ?Help from another person toileting, which includes using toliet, bedpan, or urinal?:  A Little ?Help from another person bathing (including washing, rinsing, drying)?: A Lot ?Help from another person to put on and taking off regular upper body clothing?: A Little ?Help from another person to put on and taking off regular lower body clothing?: A Lot ?6 Click Score: 18 ?  ?End of Session Equipment Utilized During Treatment: Rolling walker (2 wheels) ?Nurse Communication: Mobility status ? ?Activity Tolerance: Patient tolerated treatment  well ?Patient left: in bed;with call bell/phone within reach ? ?OT Visit Diagnosis: Unsteadiness on feet (R26.81);Muscle weakness (generalized) (M62.81)  ?              ?Time: 1425-1450 ?OT Time Calcula

## 2021-10-20 NOTE — Evaluation (Addendum)
Physical Therapy Evaluation ?Patient Details ?Name: Courtney Mack ?MRN: [id number] ?DOB: [birth year] ?Today's Date: 10/20/2021 ? ?History of Present Illness ? Pt is a 66 y/o F who underwent L ureteroscopy & a few hours after stent removal pt began to c/o L abomdinal pain, malaise, fevers & chills. Pt presented to the ED for further evaluation. Pt was admitted for tx of septic shock & required vasopressor support & required pRBC transfusion. 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 Pt seen for PT evaluation with co-tx with OT. Pt received in bed & agreeable to tx. Pt reports she lives with her daughter & grandchildren & between their schedules someone is always home with her. Pt reports she is ambulatory with rollator in the home & sleeps in a recliner chair. On this date, pt requires min<>mod assist for bed mobility with hospital bed features & CGA for STS & stand pivot with RW. After using BSC pt is able to ambulate a short distance with RW & CGA<>supervision before requiring rest break 2/2 fatigue. Pt reports she feels more weak & is not walking as far as she was prior to admission. At this time, will recommend STR upon d/c to maximize independence with functional mobility & reduce fall risk prior to return home. Will continue to follow pt acutely to address strength, endurance, balance, and gait with LRAD. ?   ? ?Recommendations for follow up therapy are one component of a multi-disciplinary discharge planning process, led by the attending physician.  Recommendations may be updated based on patient status, additional functional criteria and insurance authorization. ? ?Follow Up Recommendations Skilled nursing-short term rehab (<3 hours/day) ? ?  ?Assistance  Recommended at Discharge Frequent or constant Supervision/Assistance  ?Patient can return home with the following ? A little help with walking and/or transfers;Assistance with cooking/housework;A little help with bathing/dressing/bathroom;Assist for transportation;Direct supervision/assist for medications management ? ?  ?Equipment Recommendations None recommended by PT  ?Recommendations for Other Services ?    ?  ?Functional Status Assessment Patient has had a recent decline in their functional status and demonstrates the ability to make significant improvements in function in a reasonable and predictable amount of time.  ? ?  ?Precautions / Restrictions Precautions ?Precautions: Fall ?Restrictions ?Weight Bearing Restrictions: No  ? ?  ? ?Mobility ? Bed Mobility ?Overal bed mobility: Needs Assistance ?Bed Mobility: Supine to Sit, Sit to Supine ?  ?  ?Supine to sit: Min assist, HOB elevated (uses PT's hand to stabilize herself for supine>sit) ?Sit to supine: Mod assist (assistance to elevate BLE onto bed) ?  ?  ?  ? ?Transfers ?Overall transfer level: Needs assistance ?Equipment used: Rolling walker (2 wheels) ?Transfers: Sit to/from Stand, Bed to chair/wheelchair/BSC ?Sit to Stand: Min guard, From elevated surface ?  ?Step pivot transfers: Min guard (to [facility]) ?  ?  ?  ?  ?  ? ?Ambulation/Gait ?Ambulation/Gait assistance: Min guard, Supervision ?Gait Distance (Feet): 20 Feet ?Assistive device: Rolling walker (2 wheels) ?Gait Pattern/deviations: Decreased step length - right, Decreased step length - left, Decreased stride length, Trunk flexed ?Gait velocity: decreased ?  ?  ?General Gait Details: forward trunk lean on RW ? ?Stairs ?  ?  ?  ?  ?  ? ?Wheelchair Mobility ?  ? ?Modified Rankin (Stroke Patients Only) ?  ? ?  ? ?  Balance Overall balance assessment: Needs assistance ?Sitting-balance support: Feet supported, Bilateral upper extremity supported ?Sitting balance-Leahy Scale: Fair ?Sitting balance - Comments:  supervision static sitting ?  ?Standing balance support: During functional activity, Single extremity supported ?Standing balance-Leahy Scale: Fair ?  ?  ?  ?  ?  ?  ?  ?  ?  ?  ?  ?  ?   ? ? ? ?Pertinent Vitals/Pain Pain Assessment ?Pain Assessment: No/denies pain  ? ? ?Home Living Family/patient expects to be discharged to:: Private residence ?Living Arrangements: Children (daughter & grandchildren ranging in age from 31-18 y/o) ?Available Help at Discharge: Family;Available 24 hours/day (daughter works 3rd shift, 105 y/o works, and younger children go to school but between all of their schedules someone is always home with pt) ?Type of Home: House ?Home Access: Ramped entrance ?  ?  ?  ?Home Layout: One level ?Home Equipment: Rollator (4 wheels);BSC/3in1 ?Additional Comments: has lift recliner, does not sleep in bed (sleeps in recliner instead)  ?  ?Prior Function   ?  ?  ?  ?  ?  ?  ?Mobility Comments: Household ambulation with rollator ?ADLs Comments: DTR assisst for LB bathing, pt able to dress self. ?  ? ? ?Hand Dominance  ?   ? ?  ?Extremity/Trunk Assessment  ? Upper Extremity Assessment ?Upper Extremity Assessment: Generalized weakness ?  ? ?Lower Extremity Assessment ?Lower Extremity Assessment: Generalized weakness (BLE edema that pt reports is chronic, BLE appear to be weeping but unsure where) ?  ? ?Cervical / Trunk Assessment ?Cervical / Trunk Assessment: Kyphotic  ?Communication  ? Communication: No difficulties  ?Cognition Arousal/Alertness: Awake/alert ?Behavior During Therapy: [facility] for tasks assessed/performed, Flat affect ?Overall Cognitive Status: Within Functional Limits for tasks assessed ?  ?  ?  ?  ?  ?  ?  ?  ?  ?  ?  ?  ?  ?  ?  ?  ?General Comments: Pleasant ?  ?  ? ?  ?General Comments General comments (skin integrity, edema, etc.): BP supine in bed in RUE: 127/66 mmHg MAP 84, BP sitting EOB in RUE: 136/62 mmHg MAP 82, pt denies dizziness or other symptoms, received on 2L/min via nasal  cannula but weaned to room air with lowest reading of SpO2 84% but question accuracy of this 2/2 poor pleth, pt does endorse SOB but SpO2 quickly returns to 94% with good pleth waveform ? ?  ?Exercises    ? ?Assessment/Plan  ?  ?PT Assessment Patient needs continued PT services  ?PT Problem List Decreased strength;Decreased mobility;Cardiopulmonary status limiting activity;Decreased activity tolerance;Decreased balance ? ?   ?  ?PT Treatment Interventions DME instruction;Therapeutic activities;Gait training;Therapeutic exercise;Patient/family education;Balance training;Functional mobility training;Neuromuscular re-education   ? ?PT Goals (Current goals can be found in the Care Plan section)  ?Acute Rehab PT Goals ?Patient Stated Goal: get better ?PT Goal Formulation: With patient ?Time For Goal Achievement: 11/03/21 ?Potential to Achieve Goals: Good ? ?  ?Frequency Min 2X/week ?  ? ? ?Co-evaluation PT/OT/SLP Co-Evaluation/Treatment: Yes ?Reason for Co-Treatment: For patient/therapist safety;To address functional/ADL transfers ?PT goals addressed during session: Mobility/safety with mobility;Balance;Proper use of DME ?  ?  ? ? ?  ?AM-PAC PT "6 Clicks" Mobility  ?Outcome Measure Help needed turning from your back to your side while in a flat bed without using bedrails?: A Little ?Help needed moving from lying on your back to sitting on the side of a flat bed without using bedrails?: A Lot ?Help  needed moving to and from a bed to a chair (including a wheelchair)?: A Little ?Help needed standing up from a chair using your arms (e.g., wheelchair or bedside chair)?: A Little ?Help needed to walk in hospital room?: A Little ?Help needed climbing 3-5 steps with a railing? : A Lot ?6 Click Score: 16 ? ?  ?End of Session   ?Activity Tolerance: Patient tolerated treatment well;Patient limited by fatigue ?Patient left: in bed;with call bell/phone within reach;with bed alarm set ?Nurse Communication: Mobility status, BLE  weeping ?PT Visit Diagnosis: Unsteadiness on feet (R26.81);Difficulty in walking, not elsewhere classified (R26.2);Muscle weakness (generalized) (M62.81) ?  ? ?Time: 1425-1450 ?PT Time Calculation (min) (ACUTE ONLY): 25 m

## 2021-10-20 NOTE — Progress Notes (Signed)
?  Progress Note ? ? ?Patient: Courtney Mack ATF:[id number] DOB: 1955/11/12 DOA: 10/16/2021     3 ?DOS: the patient was seen and examined on 10/20/2021 ?  ?Brief hospital course: ?66 y.o with significant PMH of recurrent DVT/PE (08/2020) and (12/2020) on Xarelto s/p Suction thrombectomy with IVC filter placement, HTN, HLD, DM, Iron Deficiency Anemia, obesity, Endometrial Cancer, dCHF, cellulitis of Left leg, recurrent GI bleed December 2022 (Hgb 3.6), nonbleeding ulcer identified, Xarelto restarted, COVID March 2023 and recent Hospitalization on March 2023  with urosepsis 2/2 Proteus and Enterobacter bacteremia s/p left ureteral stent placed who presented to the ED with c/o abdominal pain, generalized weakness, fevers and chills. ? ?Patient underwent left ureteroscopy which was negative for obstructive stone. A few hours after stent removal, patient began to c/o of left abdominal pain, malaise, fevers and chills. She presented to the ED for further evaluation. ?  ?10/18/21- patient remains hypotensive on vasopressor support, she is severely edematous and with trending upward BNP thus IVF has been paused for now, due to severe symptomatic anemia she is receiving pRBC transfusion.  She remains critically ill but appears to be improving.  ?  ?10/19/21- patient off vasopressor support with borderline low BP, renal function is stabalizing. Will optimize for TRH.  Starting low dose lasix challenge and continuing diflucan for candida esophagitis.  ?  ?10/20/21- TRH pick up. Transfer to floors. Off pressors ? ? ?Assessment and Plan: ?* Sepsis with hypotension ([place]) ?Due to urinary source.Switched Rocephin->Keflex. Urine has no growth ? ?Chronic diastolic CHF (congestive heart failure) ([place]) ?Well compensated at this time. ? ?Acute respiratory failure with hypoxia ([place]) ?On 2 liters. Wean as able ? ? ? ? ?  ? ?Subjective: weak, hypotensive and hypoxic. No new c/o ? ?Physical Exam: ?Vitals:  ? 10/20/21 1512 10/20/21 1527 10/20/21  1600 10/20/21 1700  ?BP: 127/66  136/63 (!) 143/54  ?Pulse:  94 89 92  ?Resp:  (!) 27 (!) 24 (!) 27  ?Temp:   98.9 ?F (37.2 ?C)   ?TempSrc:   Oral   ?SpO2: 95% 90% 92% (!) 86%  ?Weight:      ?Height:      ? ?GENERAL: 66 y f lying in the bed comfortable. ?HEAD: Normocephalic, atraumatic.  ?EYES: Pupils equal, round, reactive to light.  No scleral icterus.  ?MOUTH: Moist mucosal membrane. ?NECK: Supple. No thyromegaly. No nodules. No JVD.  ?PULMONARY: mild rhonchi ?CARDIOVASCULAR: S1 and S2. Regular rate and rhythm. No murmurs, rubs, or gallops.  ?GASTROINTESTINAL: Soft, nontender, non-distended. No masses. Positive bowel sounds. No hepatosplenomegaly.  ?MUSCULOSKELETAL: No swelling, clubbing, or edema.  ?NEUROLOGIC: Mild distress due to acute illness no FND ?SKIN:intact,warm,dry ? ?Data Reviewed: ? ?Hb 7.8  ? ?Remove central line and place midline. Transfer to floors ? ?Family Communication: None ? ?Disposition: ?Status is: Inpatient ?Remains inpatient appropriate because: Anemic, Hypoxic, Hypotensive ? ? Planned Discharge Destination: Home with Home Health ? ? ?DVT prophylaxis - Heparin SQ ?Time spent: 35 minutes ? ?Author: ?Max Sane, MD ?10/20/2021 5:35 PM ? ?For on call review [email or web address].  ?

## 2021-10-20 NOTE — Assessment & Plan Note (Signed)
Well compensated at this time 

## 2021-10-21 DIAGNOSIS — A419 Sepsis, unspecified organism: Secondary | ICD-10-CM | POA: Diagnosis not present

## 2021-10-21 DIAGNOSIS — I5032 Chronic diastolic (congestive) heart failure: Secondary | ICD-10-CM | POA: Diagnosis not present

## 2021-10-21 DIAGNOSIS — J9601 Acute respiratory failure with hypoxia: Secondary | ICD-10-CM | POA: Diagnosis not present

## 2021-10-21 DIAGNOSIS — D500 Iron deficiency anemia secondary to blood loss (chronic): Secondary | ICD-10-CM

## 2021-10-21 LAB — BASIC METABOLIC PANEL
Anion gap: 4 — ABNORMAL LOW (ref 5–15)
BUN: 16 mg/dL (ref 8–23)
CO2: 30 mmol/L (ref 22–32)
Calcium: 7.9 mg/dL — ABNORMAL LOW (ref 8.9–10.3)
Chloride: 101 mmol/L (ref 98–111)
Creatinine, Ser: 0.9 mg/dL (ref 0.44–1.00)
GFR, Estimated: >60 mL/min (ref >60)
Glucose, Bld: 120 mg/dL — ABNORMAL HIGH (ref 70–99)
Potassium: 3.9 mmol/L (ref 3.5–5.1)
Sodium: 135 mmol/L (ref 135–145)

## 2021-10-21 LAB — GLUCOSE, CAPILLARY
Glucose-Capillary: 108 mg/dL — ABNORMAL HIGH (ref 70–99)
Glucose-Capillary: 154 mg/dL — ABNORMAL HIGH (ref 70–99)
Glucose-Capillary: 144 mg/dL — ABNORMAL HIGH (ref 70–99)
Glucose-Capillary: 118 mg/dL — ABNORMAL HIGH (ref 70–99)

## 2021-10-21 LAB — MAGNESIUM: Magnesium: 1.8 mg/dL (ref 1.7–2.4)

## 2021-10-21 LAB — CBC
HCT: 25 % — ABNORMAL LOW (ref 36.0–46.0)
Hemoglobin: 7.5 g/dL — ABNORMAL LOW (ref 12.0–15.0)
MCH: 24.5 pg — ABNORMAL LOW (ref 26.0–34.0)
MCHC: 30 g/dL (ref 30.0–36.0)
MCV: 81.7 fL (ref 80.0–100.0)
Platelets: 218 10*3/uL (ref 150–400)
RBC: 3.06 MIL/uL — ABNORMAL LOW (ref 3.87–5.11)
RDW: 19.1 % — ABNORMAL HIGH (ref 11.5–15.5)
WBC: 4.6 10*3/uL (ref 4.0–10.5)
nRBC: 0 % (ref 0.0–0.2)

## 2021-10-21 LAB — CULTURE, BLOOD (ROUTINE X 2)
Culture: NO GROWTH
Culture: NO GROWTH
Special Requests: ADEQUATE
Special Requests: ADEQUATE

## 2021-10-21 LAB — PHOSPHORUS: Phosphorus: 2.4 mg/dL — ABNORMAL LOW (ref 2.5–4.6)

## 2021-10-21 LAB — PREPARE RBC (CROSSMATCH)

## 2021-10-21 LAB — HEMOGLOBIN AND HEMATOCRIT, BLOOD
HCT: 29.9 % — ABNORMAL LOW (ref 36.0–46.0)
Hemoglobin: 9.2 g/dL — ABNORMAL LOW (ref 12.0–15.0)

## 2021-10-21 MED ORDER — LOPERAMIDE HCL 2 MG PO CAPS
2.0000 mg | ORAL_CAPSULE | Freq: Once | ORAL | Status: AC
  Administered 2021-10-21: 2 mg via ORAL
  Filled 2021-10-21: qty 1

## 2021-10-21 MED ORDER — SODIUM CHLORIDE 0.9% IV SOLUTION: Freq: Once | INTRAVENOUS | Status: AC

## 2021-10-21 MED ORDER — POTASSIUM & SODIUM PHOSPHATES 280-160-250 MG PO PACK
2.0000 | PACK | ORAL | Status: AC
  Administered 2021-10-21 (×2): 2 via ORAL
  Filled 2021-10-21 (×2): qty 2

## 2021-10-21 NOTE — Assessment & Plan Note (Signed)
Hemoglobin 7.5.  Transfuse 1 PRBC.  Continue iron ?

## 2021-10-21 NOTE — Assessment & Plan Note (Signed)
Well compensated at this time 

## 2021-10-21 NOTE — Assessment & Plan Note (Signed)
Due to urinary source.continue Keflex. Urine has no growth ?

## 2021-10-21 NOTE — Progress Notes (Signed)
?  Progress Note ? ? ?Patient: Courtney Mack VQQ:[id number] DOB: 07-24-1955 DOA: 10/16/2021     4 ?DOS: the patient was seen and examined on 10/21/2021 ?  ?Brief hospital course: ?66 y.o with significant PMH of recurrent DVT/PE (08/2020) and (12/2020) on Xarelto s/p Suction thrombectomy with IVC filter placement, HTN, HLD, DM, Iron Deficiency Anemia, obesity, Endometrial Cancer, dCHF, cellulitis of Left leg, recurrent GI bleed December 2022 (Hgb 3.6), nonbleeding ulcer identified, Xarelto restarted, COVID March 2023 and recent Hospitalization on March 2023  with urosepsis 2/2 Proteus and Enterobacter bacteremia s/p left ureteral stent placed who presented to the ED with c/o abdominal pain, generalized weakness, fevers and chills. ? ?Patient underwent left ureteroscopy on 4/12 which was negative for obstructive stone. A few hours after stent removal, patient began to c/o of left abdominal pain, malaise, fevers and chills. She presented to the ED for further evaluation. ?  ?10/18/21- patient remains hypotensive on vasopressor support, she is severely edematous and with trending upward BNP thus IVF has been paused for now, due to severe symptomatic anemia she is receiving pRBC transfusion.  She remains critically ill but appears to be improving.  ?  ?10/19/21- patient off vasopressor support with borderline low BP, renal function is stabalizing. Will optimize for TRH.  Starting low dose lasix challenge and continuing diflucan for candida esophagitis.  ?  ?10/20/21- TRH pick up. Transfer to floors. Off pressors ?4/16: 1 PRBC transfusion for hemoglobin 7.5 ? ? ?Assessment and Plan: ?* Sepsis with hypotension ([place]) ?Due to urinary source.continue Keflex. Urine has no growth ? ?Chronic diastolic CHF (congestive heart failure) ([place]) ?Well compensated at this time. ? ?IDA (iron deficiency anemia) ?Hemoglobin 7.5.  Transfuse 1 PRBC.  Continue iron ? ?Acute respiratory failure with hypoxia ([place]) ?Resolved.  Patient is on room air  now ? ? ? ? ?  ? ?Subjective: Weak.  No other complaints.  Agreeable for blood transfusion ? ?Physical Exam: ?Vitals:  ? 10/21/21 0500 10/21/21 0600 10/21/21 0800 10/21/21 1000  ?BP:  (!) 124/54 (!) 98/50 117/72  ?Pulse: 91 88 79 85  ?Resp: (!) 28 (!) 25 (!) 23 (!) 27  ?Temp:   98.7 ?F (37.1 ?C)   ?TempSrc:   Oral   ?SpO2: 98% 100% 99% 92%  ?Weight:      ?Height:      ? ?GENERAL: 71 y f lying in the bed comfortable. ?HEAD: Normocephalic, atraumatic.  ?EYES: Pupils equal, round, reactive to light. ?No scleral icterus.  ?MOUTH: Moist mucosal membrane. ?NECK: Supple. No thyromegaly. No nodules. No JVD.  ?PULMONARY: mild rhonchi ?CARDIOVASCULAR: S1 and S2. Regular rate and rhythm. No murmurs, rubs, or gallops.  ?GASTROINTESTINAL: Soft, nontender, non-distended. No masses. Positive bowel sounds. No hepatosplenomegaly.  ?MUSCULOSKELETAL: No swelling, clubbing, or edema.  ?NEUROLOGIC: Mild distress due to acute illness no FND ?SKIN:intact,warm,dry ? ?Data Reviewed: ? ?Hemoglobin 7.5 ? ?Family Communication: None ? ?Disposition: ?Status is: Inpatient ?Remains inpatient appropriate because: Getting blood transfusion today.  Transfer to floor ? ? Planned Discharge Destination: Home with Home Health ? ? ? DVT prophylaxis-SCDs ?Time spent: 25 minutes ? ?Author: ?Max Sane, MD ?10/21/2021 12:49 PM ? ?For on call review [email or web address].  ?

## 2021-10-21 NOTE — Assessment & Plan Note (Signed)
Resolved.  Patient is on room air now ?

## 2021-10-21 NOTE — Progress Notes (Signed)
2633: During AM bedside shift report patient asleep in bed. Per PM RN, patient awake between approximately 0300-0500. Patient in no obvious distress.  ? ?0825: Patient in bed resting, states she is comfortable at this time and does not need anything. Offered assistance to reposition, patient declined. Educated on need for weight shifts and position changes frequently to prevent pressure injury. Patient verbalizes understanding. Patient maintaining oxygen saturatio 98-99% on 2L [place], weaned to 1L [place] with patient maintaining between 94-96%. ? ?0900: Assisted patient to move up in bed, external catheter exchanged without issues.  Does not wish to reposition more than moving up in the bed.Patient assisted with breakfast set up, states she is able to feed herself once set up complete. Patient states she does not want the ensure at this time.  Patient without complaint at this time.  ? ?0945: MD rounding on patient, orders placed for blood transfusion as well as T&S.  ? ?1045: Patient states she ate approx 50% of breakfast. Informed patient of new orders for blood transfusion, patient states MD told her. Written consent on chart, verified consent and signs/symptoms to be aware of reviewed with patient. SCDs placed on patient (per order). Patient does not wish to reposition at this time nor does the patient want the ordered dietary supplement.  ? ?1125: Patient medicated (per MAR), awaiting RBC transfusion. Patient declines assistance repositioning, again encouraged frequent independent repositioning and weight shifts. Patient verbalizes understanding.  ? ?1153: Secure chat received from blood bank that patient antibody screen was positive.  ? ?1250: RBCs ready for pickup.  ? ?1320: Blood verified with second RN and started (per transfusion flow sheet). Patient offered assistance to reposition, patient declines at this time. ? ?1335:Patient vitals stable 15 min into RBC transfusion(see blood admin flow sheet). Patient  assisted to sit up for lunch.  ? ?1430: Offered patient ensure, patient would like the ensure at this time. Provided. Patient with no other requests at this time. ? ?1540: Blood transfusion complete, patient with no signs/symptoms of adverse reaction.  ? ? ?

## 2021-10-21 NOTE — Progress Notes (Signed)
PHARMACY CONSULT NOTE - FOLLOW UP ? ?Pharmacy Consult for Electrolyte Monitoring and Replacement  ? ?Recent Labs: ?Potassium (mmol/L)  ?Date Value  ?10/21/2021 3.9  ? ?Magnesium (mg/dL)  ?Date Value  ?10/21/2021 1.8  ? ?Calcium (mg/dL)  ?Date Value  ?10/21/2021 7.9 (L)  ? ?Albumin (g/dL)  ?Date Value  ?10/16/2021 2.4 (L)  ? ?Phosphorus (mg/dL)  ?Date Value  ?10/21/2021 2.4 (L)  ? ?Sodium (mmol/L)  ?Date Value  ?10/21/2021 135  ? ?Scr: 0.88 > 1.40 > 0.9 ?Corrected Calcium: 9.18 mg/dl ? ?Assessment: ?66 year old female with chief complaint of abdominal pain, generalized weakness, fevers, and chills. Past medical history includes recurrent DVT/PE rivaroxaban, HTN, HLD, DM, Iron Deficiency Anemia, obesity, Endometrial Cancer, dCHF, cellulitis of Left leg, recurrent GI bleed. Recent hospitalization with urosepsis 2/2 Proteus and Enterobacter bacteremia s/p left ureteral stent. Admitted with septic shock and severe urosepsis.  ? ?On Keflex for the above ?Feeds started on 4/12 ? ?Goal of Therapy:  ?Electrolytes within normal limits ? ?Plan:  ?Phosphorus 2.1-->2.4  ?Phos-Nak 2 packets x 2 ?Follow up electrolytes in the AM ? ?Pearla Dubonnet, PharmD ?Clinical Pharmacist ?10/21/2021 ?7:47 AM ? ? ? ?

## 2021-10-22 DIAGNOSIS — A419 Sepsis, unspecified organism: Secondary | ICD-10-CM | POA: Diagnosis not present

## 2021-10-22 DIAGNOSIS — J9601 Acute respiratory failure with hypoxia: Secondary | ICD-10-CM | POA: Diagnosis not present

## 2021-10-22 DIAGNOSIS — I5032 Chronic diastolic (congestive) heart failure: Secondary | ICD-10-CM | POA: Diagnosis not present

## 2021-10-22 DIAGNOSIS — D500 Iron deficiency anemia secondary to blood loss (chronic): Secondary | ICD-10-CM | POA: Diagnosis not present

## 2021-10-22 LAB — CBC
HCT: 28.9 % — ABNORMAL LOW (ref 36.0–46.0)
Hemoglobin: 8.8 g/dL — ABNORMAL LOW (ref 12.0–15.0)
MCH: 24.9 pg — ABNORMAL LOW (ref 26.0–34.0)
MCHC: 30.4 g/dL (ref 30.0–36.0)
MCV: 81.6 fL (ref 80.0–100.0)
Platelets: 270 10*3/uL (ref 150–400)
RBC: 3.54 MIL/uL — ABNORMAL LOW (ref 3.87–5.11)
RDW: 19 % — ABNORMAL HIGH (ref 11.5–15.5)
WBC: 5.2 10*3/uL (ref 4.0–10.5)
nRBC: 0 % (ref 0.0–0.2)

## 2021-10-22 LAB — BASIC METABOLIC PANEL
Anion gap: 6 (ref 5–15)
BUN: 14 mg/dL (ref 8–23)
CO2: 32 mmol/L (ref 22–32)
Calcium: 8.2 mg/dL — ABNORMAL LOW (ref 8.9–10.3)
Chloride: 99 mmol/L (ref 98–111)
Creatinine, Ser: 0.81 mg/dL (ref 0.44–1.00)
GFR, Estimated: >60 mL/min (ref >60)
Glucose, Bld: 120 mg/dL — ABNORMAL HIGH (ref 70–99)
Potassium: 4.2 mmol/L (ref 3.5–5.1)
Sodium: 137 mmol/L (ref 135–145)

## 2021-10-22 LAB — MAGNESIUM: Magnesium: 1.8 mg/dL (ref 1.7–2.4)

## 2021-10-22 LAB — PHOSPHORUS: Phosphorus: 2.9 mg/dL (ref 2.5–4.6)

## 2021-10-22 LAB — GLUCOSE, CAPILLARY
Glucose-Capillary: 113 mg/dL — ABNORMAL HIGH (ref 70–99)
Glucose-Capillary: 131 mg/dL — ABNORMAL HIGH (ref 70–99)
Glucose-Capillary: 96 mg/dL (ref 70–99)
Glucose-Capillary: 113 mg/dL — ABNORMAL HIGH (ref 70–99)

## 2021-10-22 LAB — ANAEROBIC CULTURE W GRAM STAIN

## 2021-10-22 NOTE — Progress Notes (Addendum)
?  Progress Note ? ? ?Patient: Courtney Mack QMV:[id number] DOB: 03-19-1956 DOA: 10/16/2021     5 ?DOS: the patient was seen and examined on 10/22/2021 ?  ?Brief hospital course: ?66 y.o with significant PMH of recurrent DVT/PE (08/2020) and (12/2020) on Xarelto s/p Suction thrombectomy with IVC filter placement, HTN, HLD, DM, Iron Deficiency Anemia, obesity, Endometrial Cancer, dCHF, cellulitis of Left leg, recurrent GI bleed December 2022 (Hgb 3.6), nonbleeding ulcer identified, Xarelto restarted, COVID March 2023 and recent Hospitalization on March 2023  with urosepsis 2/2 Proteus and Enterobacter bacteremia s/p left ureteral stent placed who presented to the ED with c/o abdominal pain, generalized weakness, fevers and chills. ? ?Patient underwent left ureteroscopy on 4/12 which was negative for obstructive stone. A few hours after stent removal, patient began to c/o of left abdominal pain, malaise, fevers and chills. She presented to the ED for further evaluation. ?  ?10/18/21- patient remains hypotensive on vasopressor support, she is severely edematous and with trending upward BNP thus IVF has been paused for now, due to severe symptomatic anemia she is receiving pRBC transfusion.  She remains critically ill but appears to be improving.  ?  ?10/19/21- patient off vasopressor support with borderline low BP, renal function is stabalizing. Will optimize for TRH.  Starting low dose lasix challenge and continuing diflucan for candida esophagitis.  ?  ?10/20/21- TRH pick up. Transfer to floors. Off pressors ?4/16: 1 PRBC transfusion for hemoglobin 7.5 ?4/17: PT, OT recommends HHPT/OT.  Low-grade fever ? ? ?Assessment and Plan: ?* Sepsis with hypotension ([place]) ?Due to urinary source.continue Keflex. Urine has no growth.  She does have a low-grade fever today ? ?Chronic diastolic CHF (congestive heart failure) ([place]) ?Well compensated at this time. ? ?IDA (iron deficiency anemia) ?Hemoglobin 7.5.  Status post 1 PRBC  transfusion on 4/16.  Hemoglobin 8.8 today.  Continue iron ? ? ? ? ? ? ?  ? ?Subjective: Feeling weak.  Agreeable to work with therapy as she wants to go home ? ?Physical Exam: ?Vitals:  ? 10/22/21 1400 10/22/21 1617 10/22/21 1705 10/22/21 2030  ?BP: 131/71 132/66  115/60  ?Pulse: 96 87  92  ?Resp: (!) 30 (!) 30 16 (!) 24  ?Temp:  99.5 ?F (37.5 ?C)  99.8 ?F (37.7 ?C)  ?TempSrc:  Oral  Oral  ?SpO2: 93% 93%  97%  ?Weight:      ?Height:      ? ?GENERAL: 69 y f lying in the bed comfortable. ?HEAD: Normocephalic, atraumatic.  ?EYES: Pupils equal, round, reactive to light.  No scleral icterus.  ?MOUTH: Moist mucosal membrane. ?NECK: Supple. No thyromegaly. No nodules. No JVD.  ?PULMONARY: mild rhonchi ?CARDIOVASCULAR: S1 and S2. Regular rate and rhythm. No murmurs, rubs, or gallops.  ?GASTROINTESTINAL: Soft, nontender, non-distended. No masses. Positive bowel sounds. No hepatosplenomegaly.  ?MUSCULOSKELETAL: No swelling, clubbing, or edema.  ?NEUROLOGIC: Mild distress due to acute illness no FND ?SKIN:intact,warm,dry ? ?Data Reviewed: ? ?Hemoglobin 8.8 ? ?Family Communication: Tried calling her daughter but could not reach - she works night shift and may be sleepy as per the patient ? ?Disposition: ?Status is: Inpatient ?Remains inpatient appropriate because: Low-grade fever.  Getting antibiotic for UTI ? ? Planned Discharge Destination: Home with Home Health ? ? ? DVT prophylaxis-SCDs ?Time spent: 35 minutes ? ?Author: ?Max Sane, MD ?10/22/2021 10:13 PM ? ?For on call review [email or web address]  ?

## 2021-10-22 NOTE — Assessment & Plan Note (Signed)
Hemoglobin 7.5.  Status post 1 PRBC transfusion on 4/16.  Hemoglobin 8.8 today.  Continue iron ?

## 2021-10-22 NOTE — Assessment & Plan Note (Signed)
Resolved.  Patient is on room air now ?

## 2021-10-22 NOTE — Assessment & Plan Note (Signed)
Well compensated at this time 

## 2021-10-22 NOTE — Progress Notes (Signed)
Physical Therapy Treatment ?Patient Details ?Name: Courtney Mack ?MRN: [id number] ?DOB: [birth year] ?Today's Date: 10/22/2021 ? ? ?History of Present Illness Pt is a 66 y/o F who underwent L ureteroscopy & a few hours after stent removal pt began to c/o L abomdinal pain, malaise, fevers & chills. Pt presented to the ED for further evaluation. Pt was admitted for tx of septic shock & required vasopressor support & required pRBC transfusion. 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 ?  ?PT Comments  ? ? Pt seen for PT tx & pt agreeable. Pt demonstrates improving strength today as she is able to transfer STS with supervision & ambulate increased distances, although still short household distances, with RW & supervision. Pt continues to have urinary incontinence with mobility & was unsuccessful with BM on BSC. Pt has decreased OOB tolerance as she asked to return to bed at end of session. Due to pt's improving mobility & progress with therapy, have updated d/c recommendations to HHPT as pt reports family can provide supervision at home.  ?   ?Recommendations for follow up therapy are one component of a multi-disciplinary discharge planning process, led by the attending physician.  Recommendations may be updated based on patient status, additional functional criteria and insurance authorization. ? ?Follow Up Recommendations ? Home health PT ?  ?  ?Assistance Recommended at Discharge Intermittent Supervision/Assistance  ?Patient can return home with the following A little help with walking and/or transfers;Assistance with cooking/housework;A little help with bathing/dressing/bathroom;Assist for transportation;Direct supervision/assist for medications management ?  ?Equipment Recommendations ?  None recommended by PT  ?  ?Recommendations for Other Services   ? ? ?  ?Precautions / Restrictions Precautions ?Precautions: Fall ?Restrictions ?Weight Bearing Restrictions: No  ?  ? ?Mobility ? Bed Mobility ?Overal bed mobility: Needs Assistance ?Bed Mobility: Sit to Supine ?  ?  ?  ?Sit to supine: Mod assist ?  ?General bed mobility comments: assistance to elevate BLE onto bed, cuing to scoot to center of bed (pt sleeps in recliner at baseline) ?  ? ?Transfers ?Overall transfer level: Needs assistance ?Equipment used: Rolling walker (2 wheels) ?Transfers: Sit to/from Stand ?Sit to Stand: Supervision ?  ?Step pivot transfers: Supervision ?  ?  ?  ?General transfer comment: STS from recliner & BSC with good abiltiy/awareness to push to standing with BUE on armrests ?  ? ?Ambulation/Gait ?Ambulation/Gait assistance: Supervision ?Gait Distance (Feet): 35 Feet ?Assistive device: Rolling walker (2 wheels) ?Gait Pattern/deviations: Decreased step length - left, Decreased step length - right, Decreased stride length ?Gait velocity: decreased ?  ?  ?General Gait Details: forward trunk lean on RW, decreased hip & knee flexion BLE during swing phase as pt appears to lean R<>L and somewhat circumduct legs during advancement. ? ? ?Stairs ?  ?  ?  ?  ?  ? ? ?Wheelchair Mobility ?  ? ?Modified Rankin (Stroke Patients Only) ?  ? ? ?  ?Balance Overall balance assessment: Needs assistance ?Sitting-balance support: Feet supported, Bilateral upper extremity supported ?Sitting balance-Leahy Scale: Good ?Sitting balance - Comments: supervision static sitting ?  ?Standing balance support: During functional activity, Bilateral upper extremity supported ?Standing balance-Leahy Scale: Fair ?  ?  ?  ?  ?  ?  ?  ?  ?  ?  ?  ?  ?  ? ?  ?  Cognition Arousal/Alertness: Awake/alert ?Behavior During Therapy: Flat affect ?Overall Cognitive Status: Within Functional Limits for tasks assessed ?  ?  ?  ?  ?  ?  ?  ?  ?  ?  ?  ?  ?  ?  ?  ?  ?  ?  ?   ? ?  ?Exercises   ? ?  ?General Comments General comments (skin integrity, edema, etc.): Pt with urinary incontinence upon standing & also attempts to have BM on BSC but unsuccessful. Pt requests to return to bed vs sitting in recliner at end of session despite not being OOB long. Pt on room air during session with poor pleth waveform during mobility so unable to accurately determine SpO2 reading; pt does endorse slight SOB when asked but when able to see reading on monitor SpO2 WNL. ?  ?  ? ?Pertinent Vitals/Pain Pain Assessment ?Pain Assessment: No/denies pain  ? ? ?Home Living   ?  ?  ?  ?  ?  ?  ?  ?  ?  ?   ?  ?Prior Function    ?  ?  ?   ? ?PT Goals (current goals can now be found in the care plan section) Acute Rehab PT Goals ?Patient Stated Goal: get better ?PT Goal Formulation: With patient ?Time For Goal Achievement: 11/03/21 ?Potential to Achieve Goals: Good ?Progress towards PT goals: Progressing toward goals ? ?  ?Frequency ? ? ? Min 2X/week ? ? ? ?  ?PT Plan Discharge plan needs to be updated  ? ? ?Co-evaluation   ?  ?  ?  ?  ? ?  ?AM-PAC PT "6 Clicks" Mobility   ?Outcome Measure ? Help needed turning from your back to your side while in a flat bed without using bedrails?: A Little ?Help needed moving from lying on your back to sitting on the side of a flat bed without using bedrails?: A Lot ?Help needed moving to and from a bed to a chair (including a wheelchair)?: A Little ?Help needed standing up from a chair using your arms (e.g., wheelchair or bedside chair)?: A Little ?Help needed to walk in hospital room?: A Little ?Help needed climbing 3-5 steps with a railing? : A Lot ?6 Click Score: 16 ? ?  ?End of Session   ?Activity Tolerance: Patient limited by fatigue ?Patient left: in bed;with call bell/phone within reach;with bed alarm set ?Nurse Communication: Mobility status ?PT Visit Diagnosis: Unsteadiness on feet (R26.81);Difficulty in walking, not elsewhere classified (R26.2);Muscle weakness  (generalized) (M62.81) ?  ? ? ?Time: 8502-7741 ?PT Time Calculation (min) (ACUTE ONLY): 16 min ? ?Charges:  $Therapeutic Activity: 8-22 mins          ?          ? ?Lavone Nian, PT, DPT ?10/22/21, 3:42 PM ? ? ?Waunita Schooner ?10/22/2021, 3:41 PM ? ?

## 2021-10-22 NOTE — TOC Progression Note (Addendum)
Transition of Care (TOC) - Progression Note  ? ? ?Patient Details  ?Name: Courtney Mack ?MRN: [id number] ?Date of Birth: 28-Feb-1956 ? ?Transition of Care (TOC) CM/SW Contact  ?Candie Chroman, LCSW ?Phone Number: ?10/22/2021, 11:07 AM ? ?Clinical Narrative:  CSW met with patient to discuss SNF recommendation. Her only preference is [facility]. She has been there in the past. If they are unable to accept her she wants to return home at discharge. Sent referral and left message for admissions coordinator to notify her.  ? ?4:09 pm: Therapy has changed recommendation to home health. [place] representative is aware. ? ?Expected Discharge Plan: [place] ?Barriers to Discharge: Continued Medical Work up ? ?Expected Discharge Plan and Services ?Expected Discharge Plan: [place] ?  ?Discharge Planning Services: CM Consult ?Post Acute Care Choice: Home Health, Resumption of Svcs/PTA Provider ?Living arrangements for the past 2 months: [place] ?                ?DME Arranged: N/A ?DME Agency: NA ?  ?  ?  ?HH Arranged: PT, RN, OT ?[place] Agency: [place] ([place]) ?Date HH Agency Contacted: 10/18/21 ?Time [place]: [id number] ?Representative spoke with at [place]: Corene Cornea ? ? ?Social Determinants of Health (SDOH) Interventions ?  ? ?Readmission Risk Interventions ? ?  10/18/2021  ? 11:23 AM 10/13/2021  ? 11:34 AM 09/12/2021  ?  5:32 PM  ?Readmission Risk Prevention Plan  ?Transportation Screening Complete Complete Complete  ?Medication Review Press photographer) Complete Complete   ?PCP or Specialist appointment within 3-5 days of discharge Complete Complete   ?[place] or Home Care Consult Complete Complete   ?SW Recovery Care/Counseling Consult Complete Complete   ?Palliative Care Screening Complete Complete Complete  ?Skilled Nursing Facility Complete Complete Complete  ? ? ?

## 2021-10-22 NOTE — Assessment & Plan Note (Signed)
Due to urinary source.continue Keflex. Urine has no growth.  She does have a low-grade fever today ?

## 2021-10-22 NOTE — NC FL2 (Signed)
?[employer] MEDICAID FL2 LEVEL OF CARE SCREENING TOOL  ?  ? ?IDENTIFICATION  ?Patient Name: ?Courtney Mack Birthdate: 30-Apr-1956 Sex: female Admission Date (Current Location): ?10/16/2021  ?[place] and [place] Number: ? [place] ?  Facility and Address:  ?[facility], [address], [place], [place] [address] ?     Provider Number: ?[id number]  ?Attending Physician Name and Address:  ?Max Sane, MD ? Relative Name and Phone Number:  ?  ?   ?Current Level of Care: ?Hospital Recommended Level of Care: ?[place] Prior Approval Number: ?  ? ?Date Approved/Denied: ?  PASRR Number: ?[id number] A ? ?Discharge Plan: ?SNF ?  ? ?Current Diagnoses: ?Patient Active Problem List  ? Diagnosis Date Noted  ? Sepsis with hypotension ([place]) 10/17/2021  ? Gastric antral vascular ectasia   ? Anemia due to GI blood loss, symptomatic with fatigue/lightheadedness and shortness of breath 10/12/2021  ? Thrombocytopenia ([place]) 09/13/2021  ? Anemia 09/13/2021  ? Acute on chronic blood loss anemia 07/18/2021  ? HLD (hyperlipidemia) 06/20/2021  ? Chronic diastolic CHF (congestive heart failure) ([place]) 06/20/2021  ? Risk factors for obstructive sleep apnea 11/10/2020  ? Uterine cancer ([place]) 11/08/2020  ? IDA (iron deficiency anemia) 09/21/2020  ? Endometrial cancer ([place]) 09/13/2020  ? Acute blood loss anemia 09/06/2020  ? Vaginal bleeding 09/06/2020  ? Anemia, unspecified 08/24/2020  ? Morbidly obese ([place]) 08/23/2020  ? Essential hypertension 08/23/2020  ? Diabetes mellitus without complication ([place]) 83/25/4982  ? Dyslipidemia 08/23/2020  ? Acute respiratory failure with hypoxia ([place])   ? Vaginal bleeding, abnormal 08/18/2020  ? Morbid obesity ([place]) 08/18/2020  ? ? ?Orientation RESPIRATION BLADDER Height & Weight   ?  ?Self, Time, Situation, Place ? Normal Continent, External catheter Weight: 205 lb 4 oz (93.1 kg) ?Height:  '4\' 10"'$  (147.3 cm)  ?BEHAVIORAL SYMPTOMS/MOOD NEUROLOGICAL BOWEL NUTRITION STATUS   ? (None)  (None) Continent Diet (2 gram sodium)  ?AMBULATORY STATUS COMMUNICATION OF NEEDS Skin   ?Limited Assist Verbally Other (Comment), Surgical wounds (Erythema/redness, scratch marks. Incision on vagina: No dressing.) ?  ?  ?  ?    ?     ?     ? ? ?Personal Care Assistance Level of Assistance  ?Bathing, Feeding, Dressing Bathing Assistance: Maximum assistance ?Feeding assistance: Limited assistance ?Dressing Assistance: Maximum assistance ?   ? ?Functional Limitations Info  ?Sight, Hearing, Speech Sight Info: Adequate ?Hearing Info: Adequate ?Speech Info: Adequate  ? ? ?SPECIAL CARE FACTORS FREQUENCY  ?PT (By licensed PT), OT (By licensed OT)   ?  ?PT Frequency: 5 x week ?OT Frequency: 5 x week ?  ?  ?  ?   ? ? ?Contractures Contractures Info: Not present  ? ? ?Additional Factors Info  ?Code Status, Allergies Code Status Info: Full code ?Allergies Info: NKDA ?  ?  ?  ?   ? ?Current Medications (10/22/2021):  This is the current hospital active medication list ?Current Facility-Administered Medications  ?Medication Dose Route Frequency Provider Last Rate Last Admin  ? 0.9 %  sodium chloride infusion  250 mL Intravenous Continuous Nance Pear, MD   Held at 10/17/21 0005  ? acetaminophen (TYLENOL) tablet 650 mg  650 mg Oral Q6H PRN Teressa Lower, NP   650 mg at 10/17/21 0946  ? cephALEXin (KEFLEX) capsule 500 mg  500 mg Oral Q6H Aleskerov, Fuad, MD   500 mg at 10/22/21 0537  ? Chlorhexidine Gluconate Cloth 2 % PADS 6 each  6 each Topical  Daily Ottie Glazier, MD   6 each at 10/21/21 2216  ? docusate sodium (COLACE) capsule 100 mg  100 mg Oral BID PRN Lang Snow, NP      ? feeding supplement (ENSURE ENLIVE / ENSURE PLUS) liquid 237 mL  237 mL Oral TID BM Ottie Glazier, MD   237 mL at 10/21/21 1950  ? fluconazole (DIFLUCAN) tablet 200 mg  200 mg Oral Daily Ottie Glazier, MD   200 mg at 10/22/21 0850  ? insulin aspart (novoLOG) injection 0-5 Units  0-5 Units Subcutaneous QHS Teressa Lower,  NP      ? insulin aspart (novoLOG) injection 0-9 Units  0-9 Units Subcutaneous TID WC Teressa Lower, NP   2 Units at 10/21/21 1121  ? multivitamin with minerals tablet 1 tablet  1 tablet Oral Daily Ottie Glazier, MD   1 tablet at 10/22/21 0850  ? nystatin (MYCOSTATIN) 100000 UNIT/ML suspension 500,000 Units  5 mL Oral QID Bradly Bienenstock, NP   500,000 Units at 10/22/21 0850  ? ondansetron (ZOFRAN) injection 4 mg  4 mg Intravenous Q6H PRN Ottie Glazier, MD   4 mg at 10/17/21 1542  ? pantoprazole (PROTONIX) EC tablet 40 mg  40 mg Oral Daily [name], RPH   40 mg at 10/22/21 3382  ? polyethylene glycol (MIRALAX / GLYCOLAX) packet 17 g  17 g Oral Daily PRN Lang Snow, NP      ? sodium chloride flush (NS) 0.9 % injection 10-40 mL  10-40 mL Intracatheter Q12H Rust-Chester, Britton L, NP   10 mL at 10/21/21 2214  ? sodium chloride flush (NS) 0.9 % injection 10-40 mL  10-40 mL Intracatheter PRN Rust-Chester, Huel Cote, NP   10 mL at 10/20/21 5053  ? ? ? ?Discharge Medications: ?Please see discharge summary for a list of discharge medications. ? ?Relevant Imaging Results: ? ?Relevant Lab Results: ? ? ?Additional Information ?SS#: [id number] ? ?Candie Chroman, LCSW ? ? ? ? ?

## 2021-10-22 NOTE — Progress Notes (Signed)
PHARMACY CONSULT NOTE - FOLLOW UP ? ?Pharmacy Consult for Electrolyte Monitoring and Replacement  ? ?Recent Labs: ?Potassium (mmol/L)  ?Date Value  ?10/22/2021 4.2  ? ?Magnesium (mg/dL)  ?Date Value  ?10/22/2021 1.8  ? ?Calcium (mg/dL)  ?Date Value  ?10/22/2021 8.2 (L)  ? ?Albumin (g/dL)  ?Date Value  ?10/16/2021 2.4 (L)  ? ?Phosphorus (mg/dL)  ?Date Value  ?10/22/2021 2.9  ? ?Sodium (mmol/L)  ?Date Value  ?10/22/2021 137  ? ?Scr: 0.81 ?Corrected Calcium: 9.48 mg/dl ? ?Assessment: ?66 year old female with chief complaint of abdominal pain, generalized weakness, fevers, and chills. Past medical history includes recurrent DVT/PE rivaroxaban, HTN, HLD, DM, Iron Deficiency Anemia, obesity, Endometrial Cancer, dCHF, cellulitis of Left leg, recurrent GI bleed. Recent hospitalization with urosepsis 2/2 Proteus and Enterobacter bacteremia s/p left ureteral stent. Admitted with septic shock and severe urosepsis.  ? ?On Keflex for the above ?Feeds started on 4/12 ? ?Goal of Therapy:  ?Electrolytes within normal limits ? ?Plan:  ?No replacement warranted.  ?Follow up electrolytes in the AM. Pharmacy will continue to follow while patient is in the ICU. ? ?Wynelle Cleveland, PharmD ?Pharmacy Resident  ?10/22/2021 ?8:16 AM ? ?

## 2021-10-22 NOTE — Progress Notes (Signed)
Occupational Therapy Treatment ?Patient Details ?Name: Courtney Mack ?MRN: [id number] ?DOB: Oct 08, 1955 ?Today's Date: 10/22/2021 ? ? ?History of present illness Pt is a 66 y/o F who underwent L ureteroscopy & a few hours after stent removal pt began to c/o L abomdinal pain, malaise, fevers & chills. Pt presented to the ED for further evaluation. Pt was admitted for tx of septic shock & required vasopressor support & required pRBC transfusion. 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  ?OT comments ? Pt seen for OT treatment on this date. Upon arrival to room, pt awake and seated upright in bed finishing lunch. Pt A&Ox4, reporting no pain, and agreeable to OT tx. Pt required only MIN A for bed mobility (pt reports she sleeps in a recliner at baseline). Once seated EOB, pt required MOD-MAX A for LB ADLs d/t poor dynamic sitting balance, however pt reports that her daughter provides this level of assist for LB ADLs at baseline. Pt performed step pivot transfer to [facility] and managed underwear requiring only MIN GUARD d/t decreased balance and activity tolerance. Following transfer, pt walked 75f with RW and performed x1 standing grooming task requiring MIN GUARD d/t decreased balance, strength and activity tolerance. Following x1 seated rest break, pt performed additional standing grooming task, again requiring only MIN GUARD. Pt confirmed that her daughter is able to provide this level of assist for ADLs and functional mobility at home. Discharge recommendation updated to home with [facility] MD and TOC informed. Pt also has all necessary DME. Pt continues to benefit from skilled OT acute care services to maximize return to PLOF and minimize risk of future falls, injury, caregiver burden, and  readmission. POC remains appropriate  ? ?Recommendations for follow up therapy are one component of a multi-disciplinary discharge planning process, led by the attending physician.  Recommendations may be updated based on patient status, additional functional criteria and insurance authorization. ?   ?Follow Up Recommendations ? Home health OT  ?  ?Assistance Recommended at Discharge Frequent or constant Supervision/Assistance  ?Patient can return home with the following ? A little help with walking and/or transfers;A lot of help with bathing/dressing/bathroom;Assistance with cooking/housework;Help with stairs or ramp for entrance ?  ?Equipment Recommendations ? None recommended by OT (pt has all necessary DME)  ?  ?   ?Precautions / Restrictions Precautions ?Precautions: Fall ?Restrictions ?Weight Bearing Restrictions: No  ? ? ?  ? ?Mobility Bed Mobility ?Overal bed mobility: Needs Assistance ?Bed Mobility: Supine to Sit ?  ?  ?Supine to sit: Min assist, HOB elevated ?  ?  ?General bed mobility comments: Requires MIN A for bring trunk upright (pt reports she sleeps in a recliner at baseline) ?  ? ?Transfers ?Overall transfer level: Needs assistance ?Equipment used: Rolling walker (2 wheels) ?Transfers: Sit to/from Stand ?Sit to Stand: Min guard ?  ?  ?  ?  ?  ?General transfer comment: From EOB and BSC ?  ?  ?Balance Overall balance assessment: Needs assistance ?Sitting-balance support: Feet supported, Bilateral upper extremity supported ?Sitting balance-Leahy Scale: Fair ?Sitting balance - Comments: supervision static sitting ?  ?Standing balance support: No upper extremity supported, During functional activity ?Standing balance-Leahy Scale: Fair ?Standing balance comment: Requires MIN GUARD to complete standing grooming tasks ?  ?  ?  ?  ?  ?  ?  ?  ?  ?  ?  ?   ? ?  ADL either performed or assessed with clinical judgement  ? ?ADL Overall ADL's : Needs assistance/impaired ?  ?  ?Grooming: Wash/dry hands;Brushing  hair;Min guard;Standing ?Grooming Details (indicate cue type and reason): Requires MIN GUARD d/t decreased activity tolerance ?  ?  ?  ?  ?  ?  ?Lower Body Dressing: Maximal assistance;Sitting/lateral leans;Moderate assistance;Sit to/from stand ?Lower Body Dressing Details (indicate cue type and reason): Pt requires MAX A to don socks while seated EOB. To don underwear, pt requires MOD A to bring underwear from feet>thigh (pt able to don over hips during sit>stand LB dressing). Pt reports her daughter provides this level of assist at baseline ?Toilet Transfer: Min guard;Ambulation;BSC/3in1;Rolling walker (2 wheels) ?  ?Toileting- Water quality scientist and Hygiene: Min guard;Sit to/from stand ?Toileting - Clothing Manipulation Details (indicate cue type and reason): to don/doff underwear ?  ?  ?Functional mobility during ADLs: Min guard;Rolling walker (2 wheels) (to walk 33f) ?  ?  ? ? ? ?Cognition Arousal/Alertness: Awake/alert ?Behavior During Therapy: [facility] assessed/performed, Flat affect ?Overall Cognitive Status: Within Functional Limits for tasks assessed ?  ?  ?  ?  ?  ?  ?  ?  ?  ?  ?  ?  ?  ?  ?  ?  ?  ?  ?  ?   ?   ?   ?General Comments While on RA, SpO2> 90% throughout. HR 110s during activity. Pt denied having dizziness or pain throughout  ? ? ?Pertinent Vitals/ Pain       Pain Assessment ?Pain Assessment: No/denies pain ? ?   ?   ? ?Frequency ? Min 2X/week  ? ? ? ? ?  ?Progress Toward Goals ? ?OT Goals(current goals can now be found in the care plan section) ? Progress towards OT goals: Progressing toward goals ? ?Acute Rehab OT Goals ?Patient Stated Goal: to get stronger ?OT Goal Formulation: With patient ?Time For Goal Achievement: 11/03/21 ?Potential to Achieve Goals: Good  ?Plan Discharge plan needs to be updated;Frequency remains appropriate   ? ?   ?AM-PAC OT "6 Clicks" Daily Activity     ?Outcome Measure ? ? Help from another person eating meals?: None ?Help from another person taking  care of personal grooming?: A Little ?Help from another person toileting, which includes using toliet, bedpan, or urinal?: A Little ?Help from another person bathing (including washing, rinsing, drying)?: A Lot ?Help from another person to put on and taking off regular upper body clothing?: A Little ?Help from another person to put on and taking off regular lower body clothing?: A Lot ?6 Click Score: 17 ? ?  ?End of Session Equipment Utilized During Treatment: Rolling walker (2 wheels) ? ?OT Visit Diagnosis: Unsteadiness on feet (R26.81);Muscle weakness (generalized) (M62.81) ?  ?Activity Tolerance Patient tolerated treatment well ?  ?Patient Left in chair;with call bell/phone within reach ?  ?Nurse Communication Mobility status ?  ? ?   ? ?Time: 1335-1400 ?OT Time Calculation (min): 25 min ? ?Charges: OT General Charges ?$OT Visit: 1 Visit ?OT Treatments ?$Self Care/Home Management : 23-37 mins ? ?LFredirick Maudlin OTR/L ?[place]? ?

## 2021-10-23 DIAGNOSIS — R319 Hematuria, unspecified: Secondary | ICD-10-CM

## 2021-10-23 DIAGNOSIS — N1330 Unspecified hydronephrosis: Secondary | ICD-10-CM

## 2021-10-23 DIAGNOSIS — I5032 Chronic diastolic (congestive) heart failure: Secondary | ICD-10-CM | POA: Diagnosis not present

## 2021-10-23 DIAGNOSIS — I959 Hypotension, unspecified: Secondary | ICD-10-CM | POA: Diagnosis not present

## 2021-10-23 DIAGNOSIS — A419 Sepsis, unspecified organism: Secondary | ICD-10-CM | POA: Diagnosis not present

## 2021-10-23 LAB — TYPE AND SCREEN
ABO/RH(D): A POS
Antibody Screen: POSITIVE
Donor AG Type: NEGATIVE
Donor AG Type: NEGATIVE
Unit division: 0
Unit division: 0
Unit division: 0
Unit division: 0

## 2021-10-23 LAB — BASIC METABOLIC PANEL
Anion gap: 5 (ref 5–15)
BUN: 14 mg/dL (ref 8–23)
CO2: 31 mmol/L (ref 22–32)
Calcium: 8.4 mg/dL — ABNORMAL LOW (ref 8.9–10.3)
Chloride: 101 mmol/L (ref 98–111)
Creatinine, Ser: 0.88 mg/dL (ref 0.44–1.00)
GFR, Estimated: >60 mL/min (ref >60)
Glucose, Bld: 106 mg/dL — ABNORMAL HIGH (ref 70–99)
Potassium: 4.3 mmol/L (ref 3.5–5.1)
Sodium: 137 mmol/L (ref 135–145)

## 2021-10-23 LAB — BPAM RBC
Blood Product Expiration Date: 202304252359
Blood Product Expiration Date: 202305012359
Blood Product Expiration Date: 202305012359
Blood Product Expiration Date: 202305132359
ISSUE DATE / TIME: 202304130837
ISSUE DATE / TIME: 202304161312
Unit Type and Rh: 5100
Unit Type and Rh: 6200
Unit Type and Rh: 6200
Unit Type and Rh: 6200

## 2021-10-23 LAB — MAGNESIUM: Magnesium: 1.8 mg/dL (ref 1.7–2.4)

## 2021-10-23 LAB — PHOSPHORUS: Phosphorus: 3.5 mg/dL (ref 2.5–4.6)

## 2021-10-23 LAB — GLUCOSE, CAPILLARY
Glucose-Capillary: 100 mg/dL — ABNORMAL HIGH (ref 70–99)
Glucose-Capillary: 136 mg/dL — ABNORMAL HIGH (ref 70–99)

## 2021-10-23 NOTE — Progress Notes (Signed)
PHARMACY CONSULT NOTE - FOLLOW UP ? ?Pharmacy Consult for Electrolyte Monitoring and Replacement  ? ?Recent Labs: ?Potassium (mmol/L)  ?Date Value  ?10/23/2021 4.3  ? ?Magnesium (mg/dL)  ?Date Value  ?10/23/2021 1.8  ? ?Calcium (mg/dL)  ?Date Value  ?10/23/2021 8.4 (L)  ? ?Albumin (g/dL)  ?Date Value  ?10/16/2021 2.4 (L)  ? ?Phosphorus (mg/dL)  ?Date Value  ?10/23/2021 3.5  ? ?Sodium (mmol/L)  ?Date Value  ?10/23/2021 137  ? ?Scr: 0.81 ?Corrected Calcium: 9.48 mg/dl ? ?Assessment: ?66 year old female with chief complaint of abdominal pain, generalized weakness, fevers, and chills. Past medical history includes recurrent DVT/PE rivaroxaban, HTN, HLD, DM, Iron Deficiency Anemia, obesity, Endometrial Cancer, dCHF, cellulitis of Left leg, recurrent GI bleed. Recent hospitalization with urosepsis 2/2 Proteus and Enterobacter bacteremia s/p left ureteral stent. Admitted with septic shock and severe urosepsis.  ? ?On Keflex for the above ?Feeds started on 4/12 ? ?Goal of Therapy:  ?Electrolytes within normal limits ? ?Plan:  ?Electrolytes WNL. No replacement warranted.  ?Pharmacy will sign off on consult and continue to monitor electrolytes peripherally. ? ?Thank you for allowing pharmacy to be a part of this patient?s care. ? ?Darrick Penna, PharmD, MS PGPM ?Clinical Pharmacist ?10/23/2021 ?10:22 AM ? ? ?

## 2021-10-23 NOTE — Progress Notes (Addendum)
Patient being discharged home. Ivs to be removed before leaving. Went over discharge instructions with patient. Patient states that she understands. Patient states that her daughter will not be here until 1400. Patient unable to go to the discharge lounge. Patient will be going home POV with daughter.  ?Patient was usable to go when daughter arrived because she had bloody urine in urine cannister. MD notified and urology came to check on urine and patient and stated that it was okay for patient to go home. ?

## 2021-10-23 NOTE — TOC Transition Note (Signed)
Transition of Care (TOC) - CM/SW Discharge Note ? ? ?Patient Details  ?Name: Courtney Mack ?MRN: [id number] ?Date of Birth: [birth year] ? ?Transition of Care (TOC) CM/SW Contact:  ?Candie Chroman, LCSW ?Phone Number: ?10/23/2021, 8:23 AM ? ? ?Clinical Narrative:  Patient has orders to discharge home today. [place] representative is aware. MD asked RN to wean off oxygen prior to leaving. No further concerns. CSW signing off.  ? ?Final next level of care: [place] ?Barriers to Discharge: Barriers Resolved ? ? ?Patient Goals and CMS Choice ?  ?CMS Medicare.gov Compare Post Acute Care list provided to:: Patient ?Choice offered to / list presented to : NA ? ?Discharge Placement ?  ?           ?  ?  ?  ?Patient and family notified of of transfer: 10/23/21 ? ?Discharge Plan and Services ?  ?Discharge Planning Services: CM Consult ?Post Acute Care Choice: Home Health, Resumption of Svcs/PTA Provider          ?DME Arranged: N/A ?DME Agency: NA ?  ?  ?  ?HH Arranged: PT, OT ?[place] Agency: [place] ([place]) ?Date HH Agency Contacted: 10/23/21 ?Time Arp: 7902 ?Representative spoke with at [place]: Floydene Flock ? ?Social Determinants of Health (SDOH) Interventions ?  ? ? ?Readmission Risk Interventions ? ?  10/18/2021  ? 11:23 AM 10/13/2021  ? 11:34 AM 09/12/2021  ?  5:32 PM  ?Readmission Risk Prevention Plan  ?Transportation Screening Complete Complete Complete  ?Medication Review Press photographer) Complete Complete   ?PCP or Specialist appointment within 3-5 days of discharge Complete Complete   ?[place] or Home Care Consult Complete Complete   ?SW Recovery Care/Counseling Consult Complete Complete   ?Palliative Care Screening Complete Complete Complete  ?Skilled Nursing Facility Complete Complete Complete  ? ? ? ? ? ?

## 2021-10-23 NOTE — Progress Notes (Signed)
[facility]) Hospital Liaison note: ? ?Notified via Epic workque from Dr. Max Sane of request for [facility] services. Will continue to follow for disposition. ? ?Please call with any outpatient palliative questions or concerns. ? ?Thank you for the opportunity to participate in this patient's care. ? ?Thank you, ?Lorelee Market, LPN ?[facility] Liaison ?[phone] ?

## 2021-10-23 NOTE — Care Management Important Message (Signed)
Important Message ? ?Patient Details  ?Name: Courtney Mack ?MRN: [id number] ?Date of Birth: [birth year] ? ? ?Medicare Important Message Given:  Yes ? ? ? ? ?[name] ?10/23/2021, 2:35 PM ?

## 2021-10-23 NOTE — Progress Notes (Signed)
Urology Consult Follow Up ? ?Subjective: ?She is actually scheduled for discharge today when nursing staff noted she had bloody urine and I have been asked by Dr. Manuella Ghazi to come and evaluate. ? ?VSS afebrile ? ?Serum creatinine of 0.88.  Hemoglobin of 8.8.  She did receive 1 PRBC transfusion on April 16 for iron deficiency anemia.  Recent cultures negative. ? ?She is scheduled to be discharged to home. ? ?Anti-infectives: ?Anti-infectives (From admission, onward)  ? ? Start     Dose/Rate Route Frequency Ordered Stop  ? 10/19/21 1315  cephALEXin (KEFLEX) capsule 500 mg       ? 500 mg Oral Every 6 hours 10/19/21 1223    ? 10/18/21 1415  fluconazole (DIFLUCAN) tablet 200 mg       ? 200 mg Oral Daily 10/18/21 1321 10/25/21 2359  ? 10/17/21 0600  cefTRIAXone (ROCEPHIN) 2 g in sodium chloride 0.9 % 100 mL IVPB  Status:  Discontinued       ? 2 g ?200 mL/hr over 30 Minutes Intravenous Every 24 hours 10/17/21 0303 10/19/21 1223  ? 10/16/21 2245  ceFEPIme (MAXIPIME) 2 g in sodium chloride 0.9 % 100 mL IVPB       ? 2 g ?200 mL/hr over 30 Minutes Intravenous  Once 10/16/21 2232 10/16/21 2345  ? 10/16/21 2245  metroNIDAZOLE (FLAGYL) IVPB 500 mg       ? 500 mg ?100 mL/hr over 60 Minutes Intravenous  Once 10/16/21 2232 10/17/21 0044  ? 10/16/21 2245  vancomycin (VANCOCIN) IVPB 1000 mg/200 mL premix  Status:  Discontinued       ? 1,000 mg ?200 mL/hr over 60 Minutes Intravenous  Once 10/16/21 2232 10/16/21 2236  ? 10/16/21 2245  vancomycin (VANCOREADY) IVPB 2000 mg/400 mL       ?See Hyperspace for full Linked Orders Report.  ? 2,000 mg ?200 mL/hr over 120 Minutes Intravenous  Once 10/16/21 2237 10/17/21 0244  ? 10/16/21 2245  vancomycin (VANCOREADY) IVPB 500 mg/100 mL  Status:  Discontinued       ?See Hyperspace for full Linked Orders Report.  ? 500 mg ?100 mL/hr over 60 Minutes Intravenous  Once 10/16/21 2237 10/18/21 1761  ? ?  ? ? ?Current Facility-Administered Medications  ?Medication Dose Route Frequency Provider Last Rate Last  Admin  ? 0.9 %  sodium chloride infusion  250 mL Intravenous Continuous Nance Pear, MD 10 mL/hr at 10/22/21 1632 Infusion Verify at 10/22/21 1632  ? acetaminophen (TYLENOL) tablet 650 mg  650 mg Oral Q6H PRN Teressa Lower, NP   650 mg at 10/17/21 0946  ? cephALEXin (KEFLEX) capsule 500 mg  500 mg Oral Q6H Aleskerov, Fuad, MD   500 mg at 10/23/21 1218  ? Chlorhexidine Gluconate Cloth 2 % PADS 6 each  6 each Topical Daily Ottie Glazier, MD   6 each at 10/23/21 0934  ? docusate sodium (COLACE) capsule 100 mg  100 mg Oral BID PRN Lang Snow, NP      ? feeding supplement (ENSURE ENLIVE / ENSURE PLUS) liquid 237 mL  237 mL Oral TID BM Aleskerov, Fuad, MD   237 mL at 10/23/21 1416  ? fluconazole (DIFLUCAN) tablet 200 mg  200 mg Oral Daily Ottie Glazier, MD   200 mg at 10/23/21 0848  ? insulin aspart (novoLOG) injection 0-5 Units  0-5 Units Subcutaneous QHS Teressa Lower, NP      ? insulin aspart (novoLOG) injection 0-9 Units  0-9 Units Subcutaneous TID WC  Teressa Lower, NP   1 Units at 10/23/21 1252  ? multivitamin with minerals tablet 1 tablet  1 tablet Oral Daily Ottie Glazier, MD   1 tablet at 10/23/21 0848  ? nystatin (MYCOSTATIN) 100000 UNIT/ML suspension 500,000 Units  5 mL Oral QID Darel Hong D, NP   500,000 Units at 10/23/21 1416  ? ondansetron (ZOFRAN) injection 4 mg  4 mg Intravenous Q6H PRN Ottie Glazier, MD   4 mg at 10/17/21 1542  ? pantoprazole (PROTONIX) EC tablet 40 mg  40 mg Oral Daily [name], RPH   40 mg at 10/23/21 0848  ? polyethylene glycol (MIRALAX / GLYCOLAX) packet 17 g  17 g Oral Daily PRN Lang Snow, NP      ? sodium chloride flush (NS) 0.9 % injection 10-40 mL  10-40 mL Intracatheter Q12H Rust-Chester, Britton L, NP   10 mL at 10/23/21 0848  ? sodium chloride flush (NS) 0.9 % injection 10-40 mL  10-40 mL Intracatheter PRN Rust-Chester, Toribio Harbour L, NP   10 mL at 10/20/21 7564  ? ? ? ?Objective: ?Vital signs in last 24 hours: ?Temp:  [98 ?F  (36.7 ?C)-99.8 ?F (37.7 ?C)] 98 ?F (36.7 ?C) (04/18 0739) ?Pulse Rate:  [87-92] 90 (04/18 0739) ?Resp:  [16-30] 17 (04/18 0739) ?BP: (110-132)/(54-67) 110/54 (04/18 0739) ?SpO2:  [93 %-97 %] 96 % (04/18 0739) ?Weight:  [94.3 kg] 94.3 kg (04/18 0628) ? ?Intake/Output from previous day: ?04/17 0701 - 04/18 0700 ?In: 1200.7 [P.O.:1200; I.V.:0.7] ?Out: 2450 [Urine:2450] ?Intake/Output this shift: ?Total I/O ?In: 480 [P.O.:480] ?Out: -  ? ? ?Physical Exam ?Constitutional:   ?   Appearance: Normal appearance. She is obese.  ?HENT:  ?   Head: Normocephalic.  ?   Nose: Nose normal.  ?Eyes:  ?   Pupils: Pupils are equal, round, and reactive to light.  ?Pulmonary:  ?   Effort: Pulmonary effort is normal.  ?Genitourinary: ?   Comments: Urine in the Purewick tubing is clear and the collected urine in the container was a clear dark orange without clots.   ?Musculoskeletal:     ?   General: Normal range of motion.  ?   Cervical back: Normal range of motion.  ?Skin: ?   General: Skin is warm and dry.  ?Neurological:  ?   General: No focal deficit present.  ?   Mental Status: She is alert.  ?Psychiatric:     ?   Mood and Affect: Mood normal.  ? ? ?Lab Results:  ?Recent Labs  ?  10/21/21 ?0420 10/21/21 ?1757 10/22/21 ?0356  ?WBC 4.6  --  5.2  ?HGB 7.5* 9.2* 8.8*  ?HCT 25.0* 29.9* 28.9*  ?PLT 218  --  270  ? ?BMET ?Recent Labs  ?  10/22/21 ?0356 10/23/21 ?0533  ?NA 137 137  ?K 4.2 4.3  ?CL 99 101  ?CO2 32 31  ?GLUCOSE 120* 106*  ?BUN 14 14  ?CREATININE 0.81 0.88  ?CALCIUM 8.2* 8.4*  ? ?PT/INR ?No results for input(s): LABPROT, INR in the last 72 hours. ?ABG ?No results for input(s): PHART, HCO3 in the last 72 hours. ? ?Invalid input(s): PCO2, PO2 ? ?Studies/Results: ?No results found. ? ? ?Assessment and Plan: ?66 year old female with a history of DVT's/PE, CHF, DM and obesity who was admitted for septic shock from an urinary source who is s/p left ureteral stent placement for left hydronephrosis on 10/18/2021 was is being discharged  today but nursing staff noticed bloody  urine from the Pure wick.  ? ?The urine in the PureWick container is a dark orange clear with no clot. ? ?Recommendations: ?-Hematuria is consistent with having an indwelling ureteral stent ?-I tried to reach out to the daughter to discuss this and to discussed return precautions, but she was not available  ?-I would encourage the patient to have good fluid intake to keep the urine clear and to contact our office if there should be thick bright red blood or dark red blood with every void and/or large clots seen ?-hold Xarelto until follow appointment if appropriate  ?-Keep follow-up with Dr. Bernardo Heater on May 10 ? ? ? LOS: 6 days  ? ? ?Anndee Connett ?10/23/2021  ?

## 2021-10-24 LAB — TYPE AND SCREEN
ABO/RH(D): A POS
Antibody Screen: POSITIVE
Unit division: 0
Unit division: 0

## 2021-10-24 LAB — BPAM RBC
Blood Product Expiration Date: 202305012359
Blood Product Expiration Date: 202305132359
Unit Type and Rh: 5100
Unit Type and Rh: 6200

## 2021-10-24 NOTE — Discharge Summary (Signed)
?Physician Discharge Summary ?  ?Patient: Courtney Mack MRN: [id number] DOB: 1955/08/10  ?Admit date:     10/16/2021  ?Discharge date: 10/23/2021  ?Discharge Physician: Max Sane  ? ?PCP: [facility] [place]  ? ?Recommendations at discharge:  ? ? F/up with outpt providers as requested ? ?Discharge Diagnoses: ?Principal Problem: ?  Sepsis with hypotension ([place]) ?Active Problems: ?  IDA (iron deficiency anemia) ?  Sepsis ([place]) ?  Chronic diastolic CHF (congestive heart failure) ([place]) ?  Hydronephrosis ? ?Resolved Problems: ?  Acute respiratory failure with hypoxia ([place]) ? 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 ? ?Patient underwent left ureteroscopy on 4/12 which was negative for obstructive stone. A few hours after stent removal, patient began to c/o of left abdominal pain, malaise, fevers and chills. She presented to the ED for further evaluation. ?  ?10/18/21- patient remains hypotensive on vasopressor support, she is severely edematous and with trending upward BNP thus IVF has been paused for now, due to severe symptomatic anemia she is receiving pRBC transfusion.  She remains critically ill but appears to be improving.  ?  ?10/19/21- patient off vasopressor support with borderline low BP, renal function is stabalizing. Will optimize for TRH.  Starting low dose lasix challenge and continuing diflucan for candida esophagitis.  ?  ?10/20/21- TRH pick up. Transfer to  floors. Off pressors ?4/16: 1 PRBC transfusion for hemoglobin 7.5 ?4/17: PT, OT recommends HHPT/OT.  Low-grade fever ? ?Assessment and Plan: ?* Sepsis with hypotension ([place]) ?Due to urinary source.continue Keflex. Urine has no growth.  Sepsis resolved ? ?Chronic diastolic CHF (congestive heart failure) ([place]) ?Well compensated at this time. ? ?IDA (iron deficiency anemia) ?Hemoglobin 7.5.  Status post 1 PRBC transfusion on 4/16. No obvious bleed, H & H stable.  Continue iron ? ?Acute respiratory failure with hypoxia ([facility])-resolved as of 10/22/2021 ?Initially required 2 liter O2. Now Resolved.  Patient is on room air now ? ?Hematuria ?consistent with having an indwelling ureteral stent per Urology ?On the day of D/C. Urology seen and felt this to be dark orange clear with no clot. ? - I've d/w patient risks and benefits of Xarelto (she reports h/o DVT in Dec'22) but then she has had recent admission on 4/7-10/14/21 for GI bleed when she was asked to hold of Xarelto as well. Her blood loss was suspected due to gastric AVM in the setting of chronic anticoagulation status post EGD ?- Patient was encouraged to have good fluid intake to keep the urine clear and to contact our office if there should be thick bright red blood or dark red blood with every void and/or large clots seen ?-hold Xarelto until follow up Urology appointment as her risk of recurrent GI bleed and Hematuria remains high ?-Keep follow-up with Dr. Bernardo Heater on May 10 ? ? ? ?  ? ? ?Consultants: Urology, [place] ?Procedures performed: Cystoscopy and left ureteral stent placement on 10/17/21  ?Disposition: Home health ?Diet recommendation:  ?Discharge Diet Orders (From admission, onward)  ? ?  Start  Ordered  ? 10/23/21 0000  Diet - low sodium heart healthy       ? 10/23/21 0818  ? ?  ?  ? ?  ? ?Cardiac diet ?DISCHARGE MEDICATION: ?Allergies as of 10/23/2021   ?No Known Allergies ?  ? ?  ?Medication List  ?  ? ?STOP taking these medications   ? ?fluconazole 200  MG tablet ?Commonly known as: DIFLUCAN ?  ?rivaroxaban 20 MG Tabs tablet ?Commonly known as: XARELTO ?  ? ?  ? ?TAKE these medications   ? ?furosemide 20 MG tablet ?Commonly known as: Lasix ?Take 1 tablet (20 mg total) by mouth daily. ?What changed: when to take this ?  ?Iron 325 (65 Fe) MG Tabs ?Take 325 mg by mouth every other day. ?  ?loperamide 2 MG capsule ?Commonly known as: IMODIUM ?Take 1 capsule (2 mg total) by mouth as needed for diarrhea or loose stools. ?  ?mometasone-formoterol 100-5 MCG/ACT Aero ?Commonly known as: DULERA ?Inhale 2 puffs into the lungs 2 (two) times daily. ?  ?multivitamin with minerals Tabs tablet ?Take 1 tablet by mouth daily. ?  ?nystatin 100000 UNIT/ML suspension ?Commonly known as: MYCOSTATIN ?Take 5 mLs (500,000 Units total) by mouth 3 (three) times daily. For 7-8 days ?  ?pantoprazole 40 MG tablet ?Commonly known as: PROTONIX ?Take 40 mg by mouth every morning. ?  ?rosuvastatin 10 MG tablet ?Commonly known as: CRESTOR ?Take 1 tablet (10 mg total) by mouth daily. ?What changed: when to take this ?  ?vitamin C 1000 MG tablet ?Take 1,000 mg by mouth daily. ?  ? ?  ? ? Follow-up Information   ? ? [facility] [place]. Go on 11/01/2021.   ?Specialty: General Practice ?Why: @ 10:40am ?Contact information: ?[address] ?[place] [address] ?[phone] ? ? ?  ?  ? ? Earlie Server, MD. Go on 10/30/2021.   ?Specialty: Oncology ?Why: Left message for them to call patient to make follow up appt ?Contact information: ?[place] [address] ?[phone] ? ? ?  ?  ? ? Efrain Sella, MD. Daphane Shepherd on 11/06/2021.   ?Specialty: Gastroenterology ?Why: Butch Penny at office stated they will call patient to make follow up appt ?Contact information: ?[address] ?[place] [address] ?[phone] ? ? ?  ?  ? ? Abbie Sons, MD. Daphane Shepherd on 11/14/2021.   ?Specialty: Urology ?Why: @ 10:15am ?Contact information: ?[address] ?[address] ?[place] [address] ?[phone] ? ? ?  ?  ? ? [facility] Follow up.   ?Specialty: Home Health Services ?Why: They will resume home health services at discharge. ? ?  ?  ? ?  ?  ? ?  ? ?Discharge Exam: ?Filed Weights  ? 10/18/21 0230 10/19/21 0320 10/23/21 3662  ?Weight: 93.1 kg 93.1 kg 94.3 kg  ? ?GENERAL: 16 y f lying in the bed comfortable. ?HEAD: Normocephalic, atraumatic.  ?EYES: Pupils equal, round, reactive to light.  No scleral icterus.  ?MOUTH: Moist mucosal membrane. ?NECK: Supple. No thyromegaly. No nodules. No JVD.  ?PULMONARY: mild rhonchi ?CARDIOVASCULAR: S1 and S2. Regular rate and rhythm. No murmurs, rubs, or gallops.  ?GASTROINTESTINAL: Soft, nontender, non-distended. No masses. Positive bowel sounds. No hepatosplenomegaly.  ?MUSCULOSKELETAL: No swelling, clubbing, or edema.  ?NEUROLOGIC: Mild distress due to acute illness no FND ?SKIN:intact,warm,dry ? ?Condition at discharge: fair ? ?The results of significant diagnostics from this hospitalization (including imaging, microbiology, ancillary and laboratory) are  listed below for reference.  ? ?Imaging Studies: ?DG Chest Port 1 View ? ?Result Date: 10/18/2021 ?CLINICAL DATA:  Edema. EXAM: PORTABLE CHEST 1 VIEW COMPARISON:  October 17, 2021. FINDINGS: The heart size and mediastinal contours are within normal limits. Both lungs are clear. Right internal jugular catheter is unchanged in position. The visualized skeletal structures are unremarkable. IMPRESSION: No active disease. Electronically Signed   By: Marijo Conception M.D.   On: 10/18/2021 10:03  ? ?DG Chest Port 1 View ? ?Result Date: 10/17/2021 ?CLINICAL DATA:  Status post central line placement EXAM: PORTABLE CHEST 1 VIEW COMPARISON:  10/16/2021 FINDINGS: Cardiac shadow is stable. Lungs are clear bilaterally. Right jugular central line is now noted in satisfactory position. No pneumothorax is seen. No bony abnormality is noted. IMPRESSION: No pneumothorax following central  line placement. Electronically Signed   By: Inez Catalina M.D.   On: 10/17/2021 01:48  ? ?DG Chest Port 1 View ? ?Result Date: 10/16/2021 ?CLINICAL DATA:  Possible sepsis EXAM: PORTABLE CHEST 1 VIEW COMPARISON:  10/12/2021

## 2021-10-25 ENCOUNTER — Encounter: Payer: Self-pay | Admitting: Gastroenterology

## 2021-10-26 ENCOUNTER — Telehealth: Payer: Self-pay

## 2021-10-26 NOTE — Telephone Encounter (Signed)
Spoke with patient's daughter Otis Peak regarding Palliative Care services. Daughter requested to call back after discussing with patient.  ?

## 2021-10-29 ENCOUNTER — Telehealth: Payer: Self-pay | Admitting: Student

## 2021-10-29 NOTE — Telephone Encounter (Signed)
Ret'd call to patient's daughter Encarnacion Chu she stated that patient stated that she didn't want Palliative services at this time.  After discussing Palliative services with the daughter and explaining to her what it was she said that it definitely sounded like something that she needs.  Daughter stated that she was going to discuss this with the patient's brother & sister to see what they think.  I told daughter that I would email her our Palliative Brochure so they could review this anbd also told her that if anyone has any questions to please give [place] a call.  Daughter wanted to cancel the referral for now and will call us or the MD back if the patient changed her mind. ?

## 2021-10-30 ENCOUNTER — Inpatient Hospital Stay: Payer: Medicare (Managed Care) | Attending: Nurse Practitioner | Admitting: Nurse Practitioner

## 2021-11-05 NOTE — Progress Notes (Deleted)
   Patient ID: Courtney Mack, female    DOB: October 06, 1955, 66 y.o.   MRN: [id number]  HPI  Ms Daise is a 66 y/o female with a history of  Echo report from 09/12/21 reviewed and showed an EF of 55-60% along with mild LVH/ LAE & mild MR.   Admitted 10/16/21 due to c/o abdominal pain, generalized weakness, fevers and chills.Patient underwent left ureteroscopy on 4/12 which was negative for obstructive stone. Needed vasopressin support due to hypotension. Urology and palliative care consults obtained. Given PRBC's due to anemia. Antibiotics given due to sepsis. Initially needed oxygen but able to be weaned to room air. Xarelto held. Discharged after 7 days. Admitted 10/12/21 due to hemoglobin of 6.4. Two units of PRBC's given along with IV protonix. GI consult obtained. EGD completed. Discharged after 2 days.   She presents today for her initial visit with a chief complaint of   Review of Systems    Physical Exam    Assessment & Plan:  1: Chronic heart failure with preserved ejection fraction with structural changes (LVH/LAE)- - NYHA class  - saw cardiology Rockey Situ) 10/04/21 - BNP 10/17/21 was 927.6  2: HTN- - BP - saw PCP at [facility] 10/23/21 reviewed and showed sodium 137, potassium 4.3, creatinine 0.88 and GFR >60  3: DM- - A1c 10/17/21 was 5.3%  4: Anemia- - hemoglobin 10/22/21 was 8.8

## 2021-11-06 ENCOUNTER — Ambulatory Visit: Payer: Medicare (Managed Care) | Admitting: Family

## 2021-11-06 ENCOUNTER — Telehealth: Payer: Self-pay | Admitting: Family

## 2021-11-06 NOTE — Telephone Encounter (Signed)
Patient did not show for her initial Heart Failure Clinic appointment on 11/06/21. Will attempt to reschedule.   ?

## 2021-11-14 ENCOUNTER — Encounter: Payer: Self-pay | Admitting: Urology

## 2021-11-14 ENCOUNTER — Ambulatory Visit (INDEPENDENT_AMBULATORY_CARE_PROVIDER_SITE_OTHER): Payer: Medicare (Managed Care) | Admitting: Urology

## 2021-11-14 VITALS — BP 118/82 | HR 97 | Ht <= 58 in | Wt 173.0 lb

## 2021-11-14 DIAGNOSIS — N1339 Other hydronephrosis: Secondary | ICD-10-CM | POA: Diagnosis not present

## 2021-11-15 NOTE — Progress Notes (Signed)
? ?11/14/2021 ?7:04 AM  ? ?Courtney Mack ?11/09/1955 ?[id number] ? ?Referring provider: [facility] ?[place] ?[place],   [address] ? ?Chief Complaint  ?Patient presents with  ? Nephrolithiasis  ? ? ?HPI: ?66 y.o. female presents for follow-up visit.  She presents today with her daughter ? ?History of septic shock secondary to a 2 mm left distal ureteral calculus March 2023 requiring emergent stent placement and extended ICU stay ?Follow-up ureteroscopy 10/17/2021 negative for stone.  Retrograde pyelogram showed no evidence of obstruction and stent was removed ?She presented early the next morning with recurrent flank pain, septic shock.  CT showed no evidence of stone though there was mild hydronephrosis and stent was replaced.  Urine/blood cultures were negative ?Based on body habitus and inability to flex knees and abduct hips positioning is very difficult for this patient ?Mild dysuria since discharge but otherwise no complaints ? ? ?PMH: ?Past Medical History:  ?Diagnosis Date  ? Acute bilateral deep vein thrombosis (DVT) of femoral veins ([place]) 01/02/2021  ? Acute massive pulmonary embolism ([place]) 08/25/2020  ? Last Assessment & Plan:  Formatting of this note might be different from the original. Post vena caval filter and on xarelto and O2  ? ARF (acute respiratory failure) ([place])   ? Cancer [facility])   ? endometrial  ? Cellulitis of left lower leg 06/20/2021  ? CHF (congestive heart failure) ([place])   ? COVID-19   ? Diabetes mellitus without complication ([place])   ? DVT (deep venous thrombosis) ([place]) 09/06/2020  ? High cholesterol   ? Hx of blood clots   ? Hyperlipidemia   ? Hypertension   ? IDA (iron deficiency anemia) 09/21/2020  ? Obesity   ? Pressure injury of skin 06/21/2021  ? Pulmonary embolism ([place])   ? Sepsis ([place])   ? Thrombocytopenia ([place])   ? Urinary tract infection 09/13/2021  ? ? ?Surgical History: ?Past Surgical History:  ?Procedure Laterality Date  ?  COLONOSCOPY N/A 06/21/2021  ? Procedure: COLONOSCOPY;  Surgeon: Toledo, Benay Pike, MD;  Location: [facility] ENDOSCOPY;  Service: Gastroenterology;  Laterality: N/A;  ? CYSTOSCOPY W/ URETERAL STENT PLACEMENT Left 09/11/2021  ? Procedure: CYSTOSCOPY WITH RETROGRADE PYELOGRAM/URETERAL STENT PLACEMENT;  Surgeon: Abbie Sons, MD;  Location: [facility] ORS;  Service: Urology;  Laterality: Left;  ? CYSTOSCOPY WITH STENT PLACEMENT Left 10/17/2021  ? Procedure: CYSTOSCOPY WITH STENT PLACEMENT;  Surgeon: Abbie Sons, MD;  Location: [facility] ORS;  Service: Urology;  Laterality: Left;  ? CYSTOSCOPY WITH URETEROSCOPY, STONE BASKETRY AND STENT PLACEMENT Left 10/16/2021  ? Procedure: CYSTOSCOPY WITH URETEROSCOPY  AND STENT REMOVAL;  Surgeon: Abbie Sons, MD;  Location: [facility] ORS;  Service: Urology;  Laterality: Left;  ? ENTEROSCOPY N/A 08/17/2021  ? Procedure: ENTEROSCOPY;  Surgeon: Lin Landsman, MD;  Location: [facility] ENDOSCOPY;  Service: Gastroenterology;  Laterality: N/A;  ? ESOPHAGOGASTRODUODENOSCOPY N/A 06/21/2021  ? Procedure: ESOPHAGOGASTRODUODENOSCOPY (EGD);  Surgeon: Toledo, Benay Pike, MD;  Location: [facility] ENDOSCOPY;  Service: Gastroenterology;  Laterality: N/A;  ? ESOPHAGOGASTRODUODENOSCOPY (EGD) WITH PROPOFOL N/A 08/17/2021  ? Procedure: ESOPHAGOGASTRODUODENOSCOPY (EGD) WITH PROPOFOL;  Surgeon: Lin Landsman, MD;  Location: [facility] ENDOSCOPY;  Service: Gastroenterology;  Laterality: N/A;  ? ESOPHAGOGASTRODUODENOSCOPY (EGD) WITH PROPOFOL N/A 10/14/2021  ? Procedure: ESOPHAGOGASTRODUODENOSCOPY (EGD) WITH PROPOFOL;  Surgeon: [name], MD;  Location: [facility] ENDOSCOPY;  Service: Endoscopy;  Laterality: N/A;  ? GIVENS CAPSULE STUDY N/A 06/22/2021  ? Procedure: GIVENS CAPSULE STUDY;  Surgeon: Alice Reichert, Benay Pike, MD;  Location:  [facility] ENDOSCOPY;  Service: Gastroenterology;  Laterality: N/A;  ? IVC FILTER INSERTION N/A 08/18/2020  ? Procedure: IVC FILTER INSERTION;  Surgeon: Katha Cabal, MD;  Location: [place] CV LAB;   Service: Cardiovascular;  Laterality: N/A;  ? PERIPHERAL VASCULAR THROMBECTOMY Bilateral 01/03/2021  ? Procedure: PERIPHERAL VASCULAR THROMBECTOMY;  Surgeon: Algernon Huxley, MD;  Location: [place] CV LAB;  Service: Cardiovascular;  Laterality: Bilateral;  ? PULMONARY THROMBECTOMY N/A 08/18/2020  ? Procedure: PULMONARY THROMBECTOMY;  Surgeon: Katha Cabal, MD;  Location: [place] CV LAB;  Service: Cardiovascular;  Laterality: N/A;  ? TUBAL LIGATION    ? VISCERAL ANGIOGRAPHY N/A 07/18/2021  ? Procedure: VISCERAL ANGIOGRAPHY;  Surgeon: Algernon Huxley, MD;  Location: [place] CV LAB;  Service: Cardiovascular;  Laterality: N/A;  ? ? ?Home Medications:  ?Allergies as of 11/14/2021   ?No Known Allergies ?  ? ?  ?Medication List  ?  ? ?  ? Accurate as of Nov 14, 2021 11:59 PM. If you have any questions, ask your nurse or doctor.  ?  ?  ? ?  ? ?furosemide 20 MG tablet ?Commonly known as: Lasix ?Take 1 tablet (20 mg total) by mouth daily. ?What changed: when to take this ?  ?Iron 325 (65 Fe) MG Tabs ?Take 325 mg by mouth every other day. ?  ?loperamide 2 MG capsule ?Commonly known as: IMODIUM ?Take 1 capsule (2 mg total) by mouth as needed for diarrhea or loose stools. ?  ?mometasone-formoterol 100-5 MCG/ACT Aero ?Commonly known as: DULERA ?Inhale 2 puffs into the lungs 2 (two) times daily. ?  ?multivitamin with minerals Tabs tablet ?Take 1 tablet by mouth daily. ?  ?nystatin 100000 UNIT/ML suspension ?Commonly known as: MYCOSTATIN ?Take 5 mLs (500,000 Units total) by mouth 3 (three) times daily. For 7-8 days ?  ?pantoprazole 40 MG tablet ?Commonly known as: PROTONIX ?Take 40 mg by mouth every morning. ?  ?rosuvastatin 10 MG tablet ?Commonly known as: CRESTOR ?Take 1 tablet (10 mg total) by mouth daily. ?What changed: when to take this ?  ?vitamin C 1000 MG tablet ?Take 1,000 mg by mouth daily. ?  ? ?  ? ? ?Allergies: No Known Allergies ? ?Family History: ?Family History  ?Problem Relation Age of Onset  ?  Hypertension Mother   ? Diabetes Mother   ? Diabetes Father   ? Hypertension Father   ? High Cholesterol Father   ? Congestive Heart Failure Father   ? Breast cancer Cousin   ? ? ?Social History:  reports that she has never smoked. She has never used smokeless tobacco. She reports that she does not currently use alcohol. She reports that she does not currently use drugs. ? ? ?Physical Exam: ?BP 118/82   Pulse 97   Ht '4\' 10"'$  (1.473 m)   Wt 173 lb (78.5 kg)   BMI 36.16 kg/m?   ?Constitutional:  Alert and oriented, No acute distress. ?Psychiatric: Normal mood and affect. ? ? ?Assessment & Plan:   ?She had recurrent hydronephrosis and sepsis after stent removal and hesitant to remove stent at this time ?Discussed periodic stent change versus placement of percutaneous nephrostomy.  PCN would have the advantage of being able to place back to gravity drainage for recurrent symptoms ?Her daughter states she works third shift and did not think the patient would be able to care for nephrostomy tube ?They have elected continued stent placement for now and will tentatively schedule stent exchange in 3 months ? ? ?  Abbie Sons, MD ? ?[place] ?[address] ?[place], [place] [address] ?[phone] ? ?

## 2021-11-16 ENCOUNTER — Encounter: Payer: Self-pay | Admitting: Emergency Medicine

## 2021-11-16 ENCOUNTER — Inpatient Hospital Stay: Payer: Medicare (Managed Care)

## 2021-11-16 ENCOUNTER — Inpatient Hospital Stay
Admission: EM | Admit: 2021-11-16 | Discharge: 2021-11-19 | DRG: 854 | Disposition: A | Discharge status: Home / Self Care | Payer: Medicare (Managed Care) | Attending: Internal Medicine | Admitting: Internal Medicine

## 2021-11-16 DIAGNOSIS — I890 Lymphedema, not elsewhere classified: Secondary | ICD-10-CM | POA: Diagnosis present

## 2021-11-16 DIAGNOSIS — E119 Type 2 diabetes mellitus without complications: Secondary | ICD-10-CM | POA: Diagnosis present

## 2021-11-16 DIAGNOSIS — Y842 Radiological procedure and radiotherapy as the cause of abnormal reaction of the patient, or of later complication, without mention of misadventure at the time of the procedure: Secondary | ICD-10-CM | POA: Diagnosis present

## 2021-11-16 DIAGNOSIS — N1330 Unspecified hydronephrosis: Secondary | ICD-10-CM | POA: Diagnosis not present

## 2021-11-16 DIAGNOSIS — I110 Hypertensive heart disease with heart failure: Secondary | ICD-10-CM | POA: Diagnosis present

## 2021-11-16 DIAGNOSIS — N3041 Irradiation cystitis with hematuria: Secondary | ICD-10-CM | POA: Diagnosis not present

## 2021-11-16 DIAGNOSIS — N3001 Acute cystitis with hematuria: Secondary | ICD-10-CM

## 2021-11-16 DIAGNOSIS — Z87440 Personal history of urinary (tract) infections: Secondary | ICD-10-CM | POA: Diagnosis not present

## 2021-11-16 DIAGNOSIS — Z6837 Body mass index (BMI) 37.0-37.9, adult: Secondary | ICD-10-CM | POA: Diagnosis not present

## 2021-11-16 DIAGNOSIS — N131 Hydronephrosis with ureteral stricture, not elsewhere classified: Secondary | ICD-10-CM | POA: Diagnosis not present

## 2021-11-16 DIAGNOSIS — N136 Pyonephrosis: Secondary | ICD-10-CM | POA: Diagnosis present

## 2021-11-16 DIAGNOSIS — Z86718 Personal history of other venous thrombosis and embolism: Secondary | ICD-10-CM | POA: Diagnosis not present

## 2021-11-16 DIAGNOSIS — D696 Thrombocytopenia, unspecified: Secondary | ICD-10-CM | POA: Diagnosis present

## 2021-11-16 DIAGNOSIS — Z83438 Family history of other disorder of lipoprotein metabolism and other lipidemia: Secondary | ICD-10-CM

## 2021-11-16 DIAGNOSIS — D509 Iron deficiency anemia, unspecified: Secondary | ICD-10-CM | POA: Diagnosis not present

## 2021-11-16 DIAGNOSIS — N3040 Irradiation cystitis without hematuria: Secondary | ICD-10-CM | POA: Diagnosis present

## 2021-11-16 DIAGNOSIS — D500 Iron deficiency anemia secondary to blood loss (chronic): Secondary | ICD-10-CM | POA: Diagnosis present

## 2021-11-16 DIAGNOSIS — I10 Essential (primary) hypertension: Secondary | ICD-10-CM | POA: Diagnosis present

## 2021-11-16 DIAGNOSIS — Z86711 Personal history of pulmonary embolism: Secondary | ICD-10-CM

## 2021-11-16 DIAGNOSIS — Z8616 Personal history of COVID-19: Secondary | ICD-10-CM | POA: Diagnosis not present

## 2021-11-16 DIAGNOSIS — I2699 Other pulmonary embolism without acute cor pulmonale: Secondary | ICD-10-CM | POA: Diagnosis not present

## 2021-11-16 DIAGNOSIS — A419 Sepsis, unspecified organism: Secondary | ICD-10-CM | POA: Diagnosis present

## 2021-11-16 DIAGNOSIS — R310 Gross hematuria: Secondary | ICD-10-CM | POA: Diagnosis present

## 2021-11-16 DIAGNOSIS — I509 Heart failure, unspecified: Secondary | ICD-10-CM | POA: Diagnosis present

## 2021-11-16 DIAGNOSIS — Q6211 Congenital occlusion of ureteropelvic junction: Secondary | ICD-10-CM | POA: Diagnosis not present

## 2021-11-16 DIAGNOSIS — Z833 Family history of diabetes mellitus: Secondary | ICD-10-CM | POA: Diagnosis not present

## 2021-11-16 DIAGNOSIS — R109 Unspecified abdominal pain: Secondary | ICD-10-CM | POA: Diagnosis not present

## 2021-11-16 DIAGNOSIS — N342 Other urethritis: Secondary | ICD-10-CM | POA: Diagnosis not present

## 2021-11-16 DIAGNOSIS — K31819 Angiodysplasia of stomach and duodenum without bleeding: Secondary | ICD-10-CM | POA: Diagnosis present

## 2021-11-16 DIAGNOSIS — E669 Obesity, unspecified: Secondary | ICD-10-CM | POA: Diagnosis present

## 2021-11-16 DIAGNOSIS — N3289 Other specified disorders of bladder: Secondary | ICD-10-CM | POA: Diagnosis not present

## 2021-11-16 DIAGNOSIS — C541 Malignant neoplasm of endometrium: Secondary | ICD-10-CM | POA: Diagnosis present

## 2021-11-16 DIAGNOSIS — Z8249 Family history of ischemic heart disease and other diseases of the circulatory system: Secondary | ICD-10-CM

## 2021-11-16 DIAGNOSIS — N390 Urinary tract infection, site not specified: Secondary | ICD-10-CM | POA: Diagnosis present

## 2021-11-16 DIAGNOSIS — Z79899 Other long term (current) drug therapy: Secondary | ICD-10-CM | POA: Diagnosis not present

## 2021-11-16 DIAGNOSIS — E7800 Pure hypercholesterolemia, unspecified: Secondary | ICD-10-CM | POA: Diagnosis present

## 2021-11-16 DIAGNOSIS — N135 Crossing vessel and stricture of ureter without hydronephrosis: Secondary | ICD-10-CM | POA: Diagnosis not present

## 2021-11-16 LAB — CBC WITH DIFFERENTIAL/PLATELET
Abs Immature Granulocytes: 0.02 10*3/uL (ref 0.00–0.07)
Basophils Absolute: 0 10*3/uL (ref 0.0–0.1)
Basophils Relative: 1 %
Eosinophils Absolute: 0.3 10*3/uL (ref 0.0–0.5)
Eosinophils Relative: 5 %
HCT: 31.7 % — ABNORMAL LOW (ref 36.0–46.0)
Hemoglobin: 9.7 g/dL — ABNORMAL LOW (ref 12.0–15.0)
Immature Granulocytes: 0 %
Lymphocytes Relative: 20 %
Lymphs Abs: 1.2 10*3/uL (ref 0.7–4.0)
MCH: 25.9 pg — ABNORMAL LOW (ref 26.0–34.0)
MCHC: 30.6 g/dL (ref 30.0–36.0)
MCV: 84.8 fL (ref 80.0–100.0)
Monocytes Absolute: 0.4 10*3/uL (ref 0.1–1.0)
Monocytes Relative: 7 %
Neutro Abs: 4 10*3/uL (ref 1.7–7.7)
Neutrophils Relative %: 67 %
Platelets: 208 10*3/uL (ref 150–400)
RBC: 3.74 MIL/uL — ABNORMAL LOW (ref 3.87–5.11)
RDW: 22.8 % — ABNORMAL HIGH (ref 11.5–15.5)
WBC: 6 10*3/uL (ref 4.0–10.5)
nRBC: 0 % (ref 0.0–0.2)

## 2021-11-16 LAB — URINALYSIS, ROUTINE W REFLEX MICROSCOPIC
Bilirubin Urine: NEGATIVE
Glucose, UA: NEGATIVE mg/dL
Ketones, ur: NEGATIVE mg/dL
Nitrite: NEGATIVE
Protein, ur: NEGATIVE mg/dL
RBC / HPF: >50 RBC/hpf — ABNORMAL HIGH (ref 0–5)
Specific Gravity, Urine: 1.006 (ref 1.005–1.030)
Squamous Epithelial / HPF: NONE SEEN (ref 0–5)
WBC, UA: >50 WBC/hpf — ABNORMAL HIGH (ref 0–5)
pH: 5 (ref 5.0–8.0)

## 2021-11-16 LAB — COMPREHENSIVE METABOLIC PANEL
ALT: 11 U/L (ref 0–44)
AST: 24 U/L (ref 15–41)
Albumin: 3.2 g/dL — ABNORMAL LOW (ref 3.5–5.0)
Alkaline Phosphatase: 49 U/L (ref 38–126)
Anion gap: 9 (ref 5–15)
BUN: 22 mg/dL (ref 8–23)
CO2: 25 mmol/L (ref 22–32)
Calcium: 8.8 mg/dL — ABNORMAL LOW (ref 8.9–10.3)
Chloride: 103 mmol/L (ref 98–111)
Creatinine, Ser: 0.94 mg/dL (ref 0.44–1.00)
GFR, Estimated: >60 mL/min (ref >60)
Glucose, Bld: 138 mg/dL — ABNORMAL HIGH (ref 70–99)
Potassium: 3.4 mmol/L — ABNORMAL LOW (ref 3.5–5.1)
Sodium: 137 mmol/L (ref 135–145)
Total Bilirubin: 0.7 mg/dL (ref 0.3–1.2)
Total Protein: 7.2 g/dL (ref 6.5–8.1)

## 2021-11-16 LAB — LACTIC ACID, PLASMA: Lactic Acid, Venous: 1.4 mmol/L (ref 0.5–1.9)

## 2021-11-16 MED ORDER — ONDANSETRON HCL 4 MG/2ML IJ SOLN: 4.0000 mg | Freq: Four times a day (QID) | INTRAMUSCULAR | Status: DC | PRN

## 2021-11-16 MED ORDER — IOHEXOL 350 MG/ML SOLN
100.0000 mL | Freq: Once | INTRAVENOUS | Status: AC | PRN
  Administered 2021-11-17: 100 mL via INTRAVENOUS

## 2021-11-16 MED ORDER — INSULIN ASPART 100 UNIT/ML IJ SOLN
0.0000 [IU] | Freq: Three times a day (TID) | INTRAMUSCULAR | Status: DC
  Administered 2021-11-17 (×2): 2 [IU] via SUBCUTANEOUS
  Administered 2021-11-18: 3 [IU] via SUBCUTANEOUS
  Filled 2021-11-16 (×3): qty 1

## 2021-11-16 MED ORDER — MORPHINE SULFATE (PF) 2 MG/ML IV SOLN: 2.0000 mg | INTRAVENOUS | Status: DC | PRN

## 2021-11-16 MED ORDER — ACETAMINOPHEN 325 MG PO TABS: 650.0000 mg | ORAL_TABLET | Freq: Four times a day (QID) | ORAL | Status: DC | PRN

## 2021-11-16 MED ORDER — ONDANSETRON HCL 4 MG PO TABS: 4.0000 mg | ORAL_TABLET | Freq: Four times a day (QID) | ORAL | Status: DC | PRN

## 2021-11-16 MED ORDER — ROSUVASTATIN CALCIUM 10 MG PO TABS
10.0000 mg | ORAL_TABLET | Freq: Every day | ORAL | Status: DC
  Administered 2021-11-17 – 2021-11-19 (×3): 10 mg via ORAL
  Filled 2021-11-16 (×3): qty 1

## 2021-11-16 MED ORDER — SODIUM CHLORIDE 0.9 % IV SOLN
20.0000 mL/h | INTRAVENOUS | Status: AC
  Administered 2021-11-17: 20 mL/h via INTRAVENOUS

## 2021-11-16 MED ORDER — PANTOPRAZOLE SODIUM 40 MG PO TBEC
40.0000 mg | DELAYED_RELEASE_TABLET | Freq: Every day | ORAL | Status: DC
  Administered 2021-11-17 – 2021-11-19 (×3): 40 mg via ORAL
  Filled 2021-11-16 (×3): qty 1

## 2021-11-16 MED ORDER — IOHEXOL 9 MG/ML PO SOLN
500.0000 mL | ORAL | Status: AC
  Administered 2021-11-16: 500 mL via ORAL

## 2021-11-16 MED ORDER — SODIUM CHLORIDE 0.9% FLUSH
3.0000 mL | Freq: Two times a day (BID) | INTRAVENOUS | Status: DC
  Administered 2021-11-17 – 2021-11-19 (×4): 3 mL via INTRAVENOUS

## 2021-11-16 MED ORDER — SODIUM CHLORIDE 0.9 % IV SOLN
2.0000 g | Freq: Once | INTRAVENOUS | Status: AC
  Administered 2021-11-16: 2 g via INTRAVENOUS
  Filled 2021-11-16: qty 20

## 2021-11-16 MED ORDER — ACETAMINOPHEN 650 MG RE SUPP: 650.0000 mg | Freq: Four times a day (QID) | RECTAL | Status: DC | PRN

## 2021-11-16 MED ORDER — MOMETASONE FURO-FORMOTEROL FUM 100-5 MCG/ACT IN AERO
2.0000 | INHALATION_SPRAY | Freq: Two times a day (BID) | RESPIRATORY_TRACT | Status: DC
  Administered 2021-11-17 – 2021-11-19 (×4): 2 via RESPIRATORY_TRACT
  Filled 2021-11-16: qty 8.8

## 2021-11-16 MED ORDER — HYDROCODONE-ACETAMINOPHEN 5-325 MG PO TABS: 1.0000 | ORAL_TABLET | ORAL | Status: DC | PRN

## 2021-11-16 NOTE — Assessment & Plan Note (Addendum)
Likely due to stent issue. Getting exchange later tonight ?

## 2021-11-16 NOTE — Assessment & Plan Note (Addendum)
Will c/s onco ?

## 2021-11-16 NOTE — H&P (Signed)
?History and Physical  ? ? ?Patient: Courtney Mack JSH:[id number] DOB: [birth year] ?DOA: 11/16/2021 ?DOS: the patient was seen and examined on 11/16/2021 ?PCP: [facility] [place]  ?Patient coming from: Home ? ?Chief Complaint:  ?Chief Complaint  ?Patient presents with  ? Vaginal Bleeding  ? ?HPI: Courtney Mack is a 66 y.o. female with medical history significant of recurrent DVT and massive PE and February 2022 and June 2022 on Xarelto status post thrombectomy and IVC filter placement, history of GI bleed in December 2022 and history of endometrial cancer, heart failure, COVID, iron deficiency anemia, recurrent UTIs comes to [place] today with gross hematuria.  Patient was admitted on 10/17/2021 for left sided abdominal pain a few hours after patient underwent a left ureteroscopy.  Patient was in the emergency room with abdominal pain fevers and chills and admitted for septic shock in the ICU.  Patient was also found to have GI bleed and Xarelto was stopped due to anemia and bleeding.  Patient's parents today on interview looks stable but concerning.  Patient is comfortable not in distress but pale appearing. ? ?Admission requested by ED physician for UTI and gross hematuria.  Patient meets sepsis criteria with heart rate respiratory rate UTI, lactic and WBC count is within normal limits. ?Discussed with patient that I will obtain CT imaging of her chest for her coughing and shortness of breath and leg swelling and also CT of the abdomen and pelvis for her left-sided abdominal pain especially in light of the history of hydronephrosis and ureteral stents. ? ? ?Review of Systems  ?Constitutional:  Positive for malaise/fatigue.  ?Respiratory:  Positive for cough and shortness of breath.   ?Cardiovascular:  Positive for leg swelling.  ?Gastrointestinal:  Positive for abdominal pain.  ?Genitourinary:  Positive for dysuria, frequency, hematuria and urgency.  ?All other systems reviewed and are  negative. ? ?Past Medical History:  ?Diagnosis Date  ? Acute bilateral deep vein thrombosis (DVT) of femoral veins ([place]) 01/02/2021  ? Acute massive pulmonary embolism ([place]) 08/25/2020  ? Last Assessment & Plan:  Formatting of this note might be different from the original. Post vena caval filter and on xarelto and O2  ? ARF (acute respiratory failure) ([place])   ? Cancer [facility])   ? endometrial  ? Cellulitis of left lower leg 06/20/2021  ? CHF (congestive heart failure) ([place])   ? COVID-19   ? Diabetes mellitus without complication ([place])   ? DVT (deep venous thrombosis) ([place]) 09/06/2020  ? High cholesterol   ? Hx of blood clots   ? Hyperlipidemia   ? Hypertension   ? IDA (iron deficiency anemia) 09/21/2020  ? Obesity   ? Pressure injury of skin 06/21/2021  ? Pulmonary embolism ([place])   ? Sepsis ([place])   ? Thrombocytopenia ([place])   ? Urinary tract infection 09/13/2021  ? ?Past Surgical History:  ?Procedure Laterality Date  ? COLONOSCOPY N/A 06/21/2021  ? Procedure: COLONOSCOPY;  Surgeon: Toledo, Benay Pike, MD;  Location: [facility] ENDOSCOPY;  Service: Gastroenterology;  Laterality: N/A;  ? CYSTOSCOPY W/ URETERAL STENT PLACEMENT Left 09/11/2021  ? Procedure: CYSTOSCOPY WITH RETROGRADE PYELOGRAM/URETERAL STENT PLACEMENT;  Surgeon: Abbie Sons, MD;  Location: [facility] ORS;  Service: Urology;  Laterality: Left;  ? CYSTOSCOPY WITH STENT PLACEMENT Left 10/17/2021  ? Procedure: CYSTOSCOPY WITH STENT PLACEMENT;  Surgeon: Abbie Sons, MD;  Location: [facility] ORS;  Service: Urology;  Laterality: Left;  ? CYSTOSCOPY WITH URETEROSCOPY, STONE BASKETRY AND STENT PLACEMENT Left 10/16/2021  ?  Procedure: CYSTOSCOPY WITH URETEROSCOPY  AND STENT REMOVAL;  Surgeon: Abbie Sons, MD;  Location: [facility] ORS;  Service: Urology;  Laterality: Left;  ? ENTEROSCOPY N/A 08/17/2021  ? Procedure: ENTEROSCOPY;  Surgeon: Lin Landsman, MD;  Location: [facility] ENDOSCOPY;  Service: Gastroenterology;  Laterality: N/A;  ? ESOPHAGOGASTRODUODENOSCOPY N/A 06/21/2021   ? Procedure: ESOPHAGOGASTRODUODENOSCOPY (EGD);  Surgeon: Toledo, Benay Pike, MD;  Location: [facility] ENDOSCOPY;  Service: Gastroenterology;  Laterality: N/A;  ? ESOPHAGOGASTRODUODENOSCOPY (EGD) WITH PROPOFOL N/A 08/17/2021  ? Procedure: ESOPHAGOGASTRODUODENOSCOPY (EGD) WITH PROPOFOL;  Surgeon: Lin Landsman, MD;  Location: [facility] ENDOSCOPY;  Service: Gastroenterology;  Laterality: N/A;  ? ESOPHAGOGASTRODUODENOSCOPY (EGD) WITH PROPOFOL N/A 10/14/2021  ? Procedure: ESOPHAGOGASTRODUODENOSCOPY (EGD) WITH PROPOFOL;  Surgeon: [name], MD;  Location: [facility] ENDOSCOPY;  Service: Endoscopy;  Laterality: N/A;  ? GIVENS CAPSULE STUDY N/A 06/22/2021  ? Procedure: GIVENS CAPSULE STUDY;  Surgeon: Toledo, Benay Pike, MD;  Location: [facility] ENDOSCOPY;  Service: Gastroenterology;  Laterality: N/A;  ? IVC FILTER INSERTION N/A 08/18/2020  ? Procedure: IVC FILTER INSERTION;  Surgeon: Katha Cabal, MD;  Location: [place] CV LAB;  Service: Cardiovascular;  Laterality: N/A;  ? PERIPHERAL VASCULAR THROMBECTOMY Bilateral 01/03/2021  ? Procedure: PERIPHERAL VASCULAR THROMBECTOMY;  Surgeon: Algernon Huxley, MD;  Location: [place] CV LAB;  Service: Cardiovascular;  Laterality: Bilateral;  ? PULMONARY THROMBECTOMY N/A 08/18/2020  ? Procedure: PULMONARY THROMBECTOMY;  Surgeon: Katha Cabal, MD;  Location: [place] CV LAB;  Service: Cardiovascular;  Laterality: N/A;  ? TUBAL LIGATION    ? VISCERAL ANGIOGRAPHY N/A 07/18/2021  ? Procedure: VISCERAL ANGIOGRAPHY;  Surgeon: Algernon Huxley, MD;  Location: [place] CV LAB;  Service: Cardiovascular;  Laterality: N/A;  ? ?Social History:  reports that she has never smoked. She has never used smokeless tobacco. She reports that she does not currently use alcohol. She reports that she does not currently use drugs. ? ?No Known Allergies ? ?Family History  ?Problem Relation Age of Onset  ? Hypertension Mother   ? Diabetes Mother   ? Diabetes Father   ? Hypertension Father   ? High  Cholesterol Father   ? Congestive Heart Failure Father   ? Breast cancer Cousin   ? ? ?Prior to Admission medications   ?Medication Sig Start Date End Date Taking? Authorizing Provider  ?Ascorbic Acid (VITAMIN C) 1000 MG tablet Take 1,000 mg by mouth daily.    [provider]  ?Ferrous Sulfate (IRON) 325 (65 Fe) MG TABS Take 325 mg by mouth every other day. 07/05/21   [provider]  ?furosemide (LASIX) 20 MG tablet Take 1 tablet (20 mg total) by mouth daily. ?Patient taking differently: Take 20 mg by mouth every morning. 09/18/21 09/18/22  Annita Brod, MD  ?loperamide (IMODIUM) 2 MG capsule Take 1 capsule (2 mg total) by mouth as needed for diarrhea or loose stools. 06/26/21   Sidney Ace, MD  ?mometasone-formoterol (DULERA) 100-5 MCG/ACT AERO Inhale 2 puffs into the lungs 2 (two) times daily.    [provider]  ?Multiple Vitamin (MULTIVITAMIN WITH MINERALS) TABS tablet Take 1 tablet by mouth daily. 09/12/20   [name], MD  ?nystatin (MYCOSTATIN) 100000 UNIT/ML suspension Take 5 mLs (500,000 Units total) by mouth 3 (three) times daily. For 7-8 days 10/14/21   Fritzi Mandes, MD  ?pantoprazole (PROTONIX) 40 MG tablet Take 40 mg by mouth every morning.    [provider]  ?rosuvastatin (CRESTOR) 10 MG tablet Take 1 tablet (10  mg total) by mouth daily. ?Patient taking differently: Take 10 mg by mouth every morning. 10/03/20   Earlie Server, MD  ? ? ?Physical Exam: ?Vitals:  ? 11/16/21 1824 11/16/21 2030 11/16/21 2100 11/16/21 2200  ?BP:  116/63 (!) 126/59 107/63  ?Pulse:  92 99 88  ?Resp:  19 (!) 23 (!) 23  ?Temp:      ?TempSrc:      ?SpO2:  98% 99% 96%  ?Weight: 78.5 kg     ?Height: '4\' 10"'$  (1.473 m)     ? ?Physical Exam ?Vitals and nursing note reviewed.  ?Constitutional:   ?   General: She is not in acute distress. ?   Appearance: She is not ill-appearing, toxic-appearing or diaphoretic.  ?HENT:  ?   Head: Normocephalic and atraumatic.  ?   Right Ear: Hearing and  external ear normal.  ?   Left Ear: Hearing and external ear normal.  ?   Nose: Nose normal. No nasal deformity.  ?   Mouth/Throat:  ?   Lips: Pink.  ?   Mouth: Mucous membranes are moist.  ?   Tongue: No lesions.  ?Eyes:

## 2021-11-16 NOTE — Assessment & Plan Note (Addendum)
Plan for stent exchange later tonight per Dr Jeffie Pollock ?

## 2021-11-16 NOTE — ED Provider Notes (Signed)
? ?[facility] ?Provider Note ? ? ? Event Date/Time  ? First MD Initiated Contact with Patient 11/16/21 2021   ?  (approximate) ? ? ?History  ? ?Vaginal Bleeding ? ? ?HPI ? ?SWAYZE KOZUCH is a 66 y.o. female   ?DVT/PE (08/2020) and (12/2020) on Xarelto s/p Suction thrombectomy with IVC filter placement, HTN, HLD, DM, Iron Deficiency Anemia, obesity, Endometrial Cancer, dCHF, cellulitis of Left leg, recurrent GI bleed December 2022 (Hgb 3.6), nonbleeding ulcer identified, Xarelto restarted, COVID March 2023 and recent Hospitalization on March 2023  with urosepsis 2/2 Proteus and Enterobacter bacteremia s/p left ureteral stent placed who presented to the ED for bleeding.  Patient reported having bleeding that she thinks is coming from her vagina.  Patient reports having significantly clots coming out that started yesterday with some associated dysuria she reports not being on her blood thinner anymore.  Denies any chest pain or shortness of breath. ? ? ?I reviewed the admission from 4/11 until 4/18 where patient had to be admitted for severe anemia.  Patient had some hematuria which was thought to be secondary to the stent. ? ? ? ?Physical Exam  ? ?Triage Vital Signs: ?ED Triage Vitals  ?Enc Vitals Group  ?   BP 11/16/21 1822 123/77  ?   Pulse Rate 11/16/21 1822 (!) 104  ?   Resp 11/16/21 1822 18  ?   Temp 11/16/21 1822 98.5 ?F (36.9 ?C)  ?   Temp Source 11/16/21 1822 Oral  ?   SpO2 11/16/21 1822 98 %  ?   Weight 11/16/21 1824 173 lb (78.5 kg)  ?   Height 11/16/21 1824 '4\' 10"'$  (1.473 m)  ?   Head Circumference --   ?   Peak Flow --   ?   Pain Score --   ?   Pain Loc --   ?   Pain Edu? --   ?   Excl. in [place]? --   ? ? ?Most recent vital signs: ?Vitals:  ? 11/16/21 1822 11/16/21 2030  ?BP: 123/77 116/63  ?Pulse: (!) 104 92  ?Resp: 18 19  ?Temp: 98.5 ?F (36.9 ?C)   ?SpO2: 98% 98%  ? ? ? ?General: Awake, no distress.  ?CV:  Good peripheral perfusion.  ?Resp:  Normal effort.  ?Abd:  No distention.   ?Other:  Vaginal exam was done without any evidence of blood.  Rectal exam showed brown stool Hemoccult positive.  Straight cath did show positive urine with blood ? ? ?ED Results / Procedures / Treatments  ? ?Labs ?(all labs ordered are listed, but only abnormal results are displayed) ?Labs Reviewed  ?CBC WITH DIFFERENTIAL/PLATELET - Abnormal; Notable for the following components:  ?    Result Value  ? RBC 3.74 (*)   ? Hemoglobin 9.7 (*)   ? HCT 31.7 (*)   ? MCH 25.9 (*)   ? RDW 22.8 (*)   ? All other components within normal limits  ?COMPREHENSIVE METABOLIC PANEL - Abnormal; Notable for the following components:  ? Potassium 3.4 (*)   ? Glucose, Bld 138 (*)   ? Calcium 8.8 (*)   ? Albumin 3.2 (*)   ? All other components within normal limits  ?URINALYSIS, ROUTINE W REFLEX MICROSCOPIC  ?TYPE AND SCREEN  ? ? ? ?RADIOLOGY ?none ? ?PROCEDURES: ? ?Critical Care performed: No ? ?.1-3 Lead EKG Interpretation ?Performed by: Vanessa [place], MD ?Authorized by: Vanessa [place], MD  ? ?  Interpretation: normal   ?  ECG rate:  80 ?  ECG rate assessment: normal   ?  Rhythm: sinus rhythm   ?  Ectopy: none   ?  Conduction: normal   ? ? ?MEDICATIONS ORDERED IN ED: ?Medications  ?cefTRIAXone (ROCEPHIN) 2 g in sodium chloride 0.9 % 100 mL IVPB (has no administration in time range)  ?iohexol (OMNIPAQUE) 9 MG/ML oral solution 500 mL (has no administration in time range)  ? ? ? ?IMPRESSION / MDM / ASSESSMENT AND PLAN / ED COURSE  ?I reviewed the triage vital signs and the nursing notes. ? ? ?Patient comes in with vaginal bleeding but upon assessment examination it appears that it is coming from her urinary bladder.  Labs were ordered and her hemoglobin is 9.7.  According to patient she was at [facility] earlier this week and her hemoglobin was 11.7.  Her CMP is reassuring except for slightly low potassium.  She is a positive but does have a lot of antibodies so it would take longer to get her blood. ? ?Her UA is concerning  for infection but is complicated given she does have a stented.  We will send for culture but given patient does report some dysuria we will add on antibiotics.  Patient does not meet sepsis criteria at this time. ? ?We will get bladder scan to look for any evidence of retention ?Bladder scan was normal and patient was still able to urinate.  Discussed the case with Dr. Jeffie Pollock who recommends holding off on catheter placement if she is still urinating and agrees with admission for trending hemoglobin as well as antibiotics given history of urosepsis ? ? ?The patient is on the cardiac monitor to evaluate for evidence of arrhythmia and/or significant heart rate changes. ? ?  ? ? ?FINAL CLINICAL IMPRESSION(S) / ED DIAGNOSES  ? ?Final diagnoses:  ?Acute cystitis with hematuria  ? ? ? ?Rx / DC Orders  ? ?ED Discharge Orders   ? ? None  ? ?  ? ? ? ?Note:  This document was prepared using Dragon voice recognition software and may include unintentional dictation errors. ?  ?Vanessa , MD ?11/16/21 2219 ? ?

## 2021-11-16 NOTE — Assessment & Plan Note (Addendum)
SSI

## 2021-11-16 NOTE — Assessment & Plan Note (Addendum)
Continue PPI ?

## 2021-11-16 NOTE — ED Triage Notes (Signed)
Pt presents via POV with complaints of vaginal bleeding that started yesterday. She notes going through ~20 pads over the last day with occasional clots and streaks. Hx of blood clots and was recently stopped on her blood thinner ~ a month ago due to left kidney stent placement. Denies CP or SOB.  ?

## 2021-11-16 NOTE — Assessment & Plan Note (Addendum)
monitor

## 2021-11-16 NOTE — Assessment & Plan Note (Addendum)
CT chest showed small PE in the posterior right lower lobe. On heparin drip for now. Will c/s onco in am to decide anticoagulation plans ? ?

## 2021-11-16 NOTE — Assessment & Plan Note (Addendum)
?    Latest Ref Rng & Units 11/17/2021  ? 12:09 PM 11/17/2021  ?  9:50 AM 11/17/2021  ?  1:09 AM  ?CBC  ?WBC 4.0 - 10.5 K/uL 4.7    7.4    ?Hemoglobin 12.0 - 15.0 g/dL 7.7   8.4   9.1    ?Hematocrit 36.0 - 46.0 % 24.6   27.1   29.4    ?Platelets 150 - 400 K/uL 177    198    ?  ?1 PRBC transfusion ordered for Hb 7.7 - likely due to hematuria ? ?

## 2021-11-16 NOTE — Assessment & Plan Note (Addendum)
Will continue rocephin for now ?

## 2021-11-16 NOTE — Assessment & Plan Note (Addendum)
Blood pressure 107/63, pulse 88, temperature 98.5 ?F (36.9 ?C), temperature source Oral, resp. rate (!) 23, height '4\' 10"'$  (1.473 m), weight 78.5 kg, SpO2 96 %. ?sepsis resolved with treatment. Continue Abx. Stent exchange later tonight. Can transfer to floors with off unit tele ?

## 2021-11-16 NOTE — Assessment & Plan Note (Addendum)
Well compensated at this time. Monitor fluids balance ?Net IO Since Admission: -107.59 mL [11/17/21 1659] ? ?

## 2021-11-16 NOTE — Assessment & Plan Note (Signed)
CBC ?   ?Component Value Date/Time  ? WBC 6.0 11/16/2021 1827  ? RBC 3.74 (L) 11/16/2021 1827  ? HGB 9.7 (L) 11/16/2021 1827  ? HCT 31.7 (L) 11/16/2021 1827  ? PLT 208 11/16/2021 1827  ? MCV 84.8 11/16/2021 1827  ? MCH 25.9 (L) 11/16/2021 1827  ? MCHC 30.6 11/16/2021 1827  ? RDW 22.8 (H) 11/16/2021 1827  ? LYMPHSABS 1.2 11/16/2021 1827  ? MONOABS 0.4 11/16/2021 1827  ? EOSABS 0.3 11/16/2021 1827  ? BASOSABS 0.0 11/16/2021 1827  ?h/o thrombocytopenia stable currently.  ? ? ?

## 2021-11-16 NOTE — Assessment & Plan Note (Addendum)
D/w Dr. Jeffie Pollock. He feels there may be obstruction on left per his review of CT and planning stent exchange later tonight. xarelto on hold and is on heparin drip ? ?

## 2021-11-17 ENCOUNTER — Inpatient Hospital Stay: Payer: Medicare (Managed Care) | Admitting: Anesthesiology

## 2021-11-17 ENCOUNTER — Inpatient Hospital Stay: Payer: Medicare (Managed Care)

## 2021-11-17 ENCOUNTER — Encounter
Admission: EM | Disposition: A | Discharge status: Home / Self Care | Payer: Self-pay | Source: Home / Self Care | Attending: Internal Medicine

## 2021-11-17 DIAGNOSIS — A419 Sepsis, unspecified organism: Secondary | ICD-10-CM

## 2021-11-17 DIAGNOSIS — Q6211 Congenital occlusion of ureteropelvic junction: Secondary | ICD-10-CM

## 2021-11-17 DIAGNOSIS — R310 Gross hematuria: Secondary | ICD-10-CM

## 2021-11-17 DIAGNOSIS — N3040 Irradiation cystitis without hematuria: Secondary | ICD-10-CM | POA: Diagnosis present

## 2021-11-17 DIAGNOSIS — N342 Other urethritis: Secondary | ICD-10-CM

## 2021-11-17 DIAGNOSIS — N131 Hydronephrosis with ureteral stricture, not elsewhere classified: Secondary | ICD-10-CM

## 2021-11-17 DIAGNOSIS — N3289 Other specified disorders of bladder: Secondary | ICD-10-CM

## 2021-11-17 DIAGNOSIS — N135 Crossing vessel and stricture of ureter without hydronephrosis: Secondary | ICD-10-CM

## 2021-11-17 HISTORY — PX: CYSTOSCOPY WITH FULGERATION: SHX6638

## 2021-11-17 HISTORY — PX: CYSTOSCOPY W/ URETERAL STENT PLACEMENT: SHX1429

## 2021-11-17 LAB — CBC
HCT: 29.4 % — ABNORMAL LOW (ref 36.0–46.0)
HCT: 24.6 % — ABNORMAL LOW (ref 36.0–46.0)
Hemoglobin: 9.1 g/dL — ABNORMAL LOW (ref 12.0–15.0)
Hemoglobin: 7.7 g/dL — ABNORMAL LOW (ref 12.0–15.0)
MCH: 25.9 pg — ABNORMAL LOW (ref 26.0–34.0)
MCH: 26 pg (ref 26.0–34.0)
MCHC: 31 g/dL (ref 30.0–36.0)
MCHC: 31.3 g/dL (ref 30.0–36.0)
MCV: 83.8 fL (ref 80.0–100.0)
MCV: 83.1 fL (ref 80.0–100.0)
Platelets: 198 10*3/uL (ref 150–400)
Platelets: 177 10*3/uL (ref 150–400)
RBC: 3.51 MIL/uL — ABNORMAL LOW (ref 3.87–5.11)
RBC: 2.96 MIL/uL — ABNORMAL LOW (ref 3.87–5.11)
RDW: 22.8 % — ABNORMAL HIGH (ref 11.5–15.5)
RDW: 22.7 % — ABNORMAL HIGH (ref 11.5–15.5)
WBC: 7.4 10*3/uL (ref 4.0–10.5)
WBC: 4.7 10*3/uL (ref 4.0–10.5)
nRBC: 0 % (ref 0.0–0.2)
nRBC: 0 % (ref 0.0–0.2)

## 2021-11-17 LAB — COMPREHENSIVE METABOLIC PANEL
ALT: 10 U/L (ref 0–44)
AST: 21 U/L (ref 15–41)
Albumin: 2.8 g/dL — ABNORMAL LOW (ref 3.5–5.0)
Alkaline Phosphatase: 44 U/L (ref 38–126)
Anion gap: 7 (ref 5–15)
BUN: 21 mg/dL (ref 8–23)
CO2: 29 mmol/L (ref 22–32)
Calcium: 8.6 mg/dL — ABNORMAL LOW (ref 8.9–10.3)
Chloride: 100 mmol/L (ref 98–111)
Creatinine, Ser: 0.87 mg/dL (ref 0.44–1.00)
GFR, Estimated: >60 mL/min (ref >60)
Glucose, Bld: 113 mg/dL — ABNORMAL HIGH (ref 70–99)
Potassium: 3.2 mmol/L — ABNORMAL LOW (ref 3.5–5.1)
Sodium: 136 mmol/L (ref 135–145)
Total Bilirubin: 0.7 mg/dL (ref 0.3–1.2)
Total Protein: 6.6 g/dL (ref 6.5–8.1)

## 2021-11-17 LAB — APTT
aPTT: 171 seconds (ref 24–36)
aPTT: 98 seconds — ABNORMAL HIGH (ref 24–36)

## 2021-11-17 LAB — HEPARIN LEVEL (UNFRACTIONATED)
Heparin Unfractionated: 0.47 IU/mL (ref 0.30–0.70)
Heparin Unfractionated: 0.45 IU/mL (ref 0.30–0.70)
Heparin Unfractionated: 0.36 IU/mL (ref 0.30–0.70)

## 2021-11-17 LAB — HEMOGLOBIN A1C
Hgb A1c MFr Bld: 5.7 % — ABNORMAL HIGH (ref 4.8–5.6)
Mean Plasma Glucose: 116.89 mg/dL

## 2021-11-17 LAB — GLUCOSE, CAPILLARY
Glucose-Capillary: 130 mg/dL — ABNORMAL HIGH (ref 70–99)
Glucose-Capillary: 134 mg/dL — ABNORMAL HIGH (ref 70–99)
Glucose-Capillary: 132 mg/dL — ABNORMAL HIGH (ref 70–99)
Glucose-Capillary: 98 mg/dL (ref 70–99)
Glucose-Capillary: 103 mg/dL — ABNORMAL HIGH (ref 70–99)

## 2021-11-17 LAB — HEMOGLOBIN AND HEMATOCRIT, BLOOD
HCT: 27.1 % — ABNORMAL LOW (ref 36.0–46.0)
HCT: 28.7 % — ABNORMAL LOW (ref 36.0–46.0)
Hemoglobin: 8.4 g/dL — ABNORMAL LOW (ref 12.0–15.0)
Hemoglobin: 9.1 g/dL — ABNORMAL LOW (ref 12.0–15.0)

## 2021-11-17 LAB — PROTIME-INR
INR: 1.3 — ABNORMAL HIGH (ref 0.8–1.2)
Prothrombin Time: 15.6 seconds — ABNORMAL HIGH (ref 11.4–15.2)

## 2021-11-17 LAB — LACTIC ACID, PLASMA: Lactic Acid, Venous: 1.6 mmol/L (ref 0.5–1.9)

## 2021-11-17 LAB — PREPARE RBC (CROSSMATCH)

## 2021-11-17 SURGERY — CYSTOSCOPY, FLEXIBLE, WITH STENT REPLACEMENT
Anesthesia: Monitor Anesthesia Care | Laterality: Left

## 2021-11-17 MED ORDER — HYDROCODONE-ACETAMINOPHEN 5-325 MG PO TABS: 1.0000 | ORAL_TABLET | ORAL | Status: DC | PRN

## 2021-11-17 MED ORDER — PROPOFOL 10 MG/ML IV BOLUS
INTRAVENOUS | Status: AC
  Filled 2021-11-17: qty 20

## 2021-11-17 MED ORDER — SODIUM CHLORIDE 0.9 % IV SOLN: INTRAVENOUS | Status: DC | PRN

## 2021-11-17 MED ORDER — PROPOFOL 10 MG/ML IV BOLUS
INTRAVENOUS | Status: DC | PRN
Start: 2021-11-17 — End: 2021-11-17
  Administered 2021-11-17 (×7): 30 mg via INTRAVENOUS

## 2021-11-17 MED ORDER — POTASSIUM CHLORIDE CRYS ER 20 MEQ PO TBCR
40.0000 meq | EXTENDED_RELEASE_TABLET | Freq: Once | ORAL | Status: AC
  Administered 2021-11-17: 40 meq via ORAL
  Filled 2021-11-17: qty 2

## 2021-11-17 MED ORDER — HEPARIN BOLUS VIA INFUSION
3700.0000 [IU] | Freq: Once | INTRAVENOUS | Status: AC
  Administered 2021-11-17: 3700 [IU] via INTRAVENOUS
  Filled 2021-11-17: qty 3700

## 2021-11-17 MED ORDER — KETAMINE HCL 10 MG/ML IJ SOLN
INTRAMUSCULAR | Status: DC | PRN
  Administered 2021-11-17 (×3): 10 mg via INTRAVENOUS

## 2021-11-17 MED ORDER — ACETAMINOPHEN 325 MG PO TABS: 650.0000 mg | ORAL_TABLET | Freq: Four times a day (QID) | ORAL | Status: DC | PRN

## 2021-11-17 MED ORDER — HEPARIN (PORCINE) 25000 UT/250ML-% IV SOLN
1000.0000 [IU]/h | INTRAVENOUS | Status: DC
  Administered 2021-11-17 – 2021-11-18 (×2): 1000 [IU]/h via INTRAVENOUS
  Filled 2021-11-17 (×2): qty 250

## 2021-11-17 MED ORDER — ACETAMINOPHEN 650 MG RE SUPP: 650.0000 mg | Freq: Four times a day (QID) | RECTAL | Status: DC | PRN

## 2021-11-17 MED ORDER — MIDAZOLAM HCL 2 MG/2ML IJ SOLN
INTRAMUSCULAR | Status: AC
  Filled 2021-11-17: qty 2

## 2021-11-17 MED ORDER — KETAMINE HCL 50 MG/5ML IJ SOSY
PREFILLED_SYRINGE | INTRAMUSCULAR | Status: AC
  Filled 2021-11-17: qty 5

## 2021-11-17 MED ORDER — SODIUM CHLORIDE 0.9 % IV SOLN
1.0000 g | INTRAVENOUS | Status: DC
  Administered 2021-11-18 (×2): 1 g via INTRAVENOUS
  Filled 2021-11-17: qty 10
  Filled 2021-11-17: qty 1
  Filled 2021-11-17: qty 10

## 2021-11-17 MED ORDER — MIDAZOLAM HCL 2 MG/2ML IJ SOLN
INTRAMUSCULAR | Status: DC | PRN
  Administered 2021-11-17: 1 mg via INTRAVENOUS

## 2021-11-17 MED ORDER — SODIUM CHLORIDE 0.9% IV SOLUTION: Freq: Once | INTRAVENOUS | Status: AC

## 2021-11-17 MED ORDER — CHLORHEXIDINE GLUCONATE CLOTH 2 % EX PADS
6.0000 | MEDICATED_PAD | Freq: Every day | CUTANEOUS | Status: DC
  Administered 2021-11-17 – 2021-11-18 (×2): 6 via TOPICAL

## 2021-11-17 SURGICAL SUPPLY — 18 items
BAG DRAIN CYSTO-URO LG1000N (MISCELLANEOUS) ×3 IMPLANT
CATH URETL OPEN 5X70 (CATHETERS) ×3 IMPLANT
GAUZE 4X4 16PLY ~~LOC~~+RFID DBL (SPONGE) ×6 IMPLANT
GLOVE BIO SURGEON STRL SZ8 (GLOVE) ×3 IMPLANT
GOWN STRL REUS W/ TWL LRG LVL4 (GOWN DISPOSABLE) ×2 IMPLANT
GOWN STRL REUS W/ TWL XL LVL3 (GOWN DISPOSABLE) ×2 IMPLANT
GOWN STRL REUS W/TWL LRG LVL4 (GOWN DISPOSABLE) ×1
GOWN STRL REUS W/TWL XL LVL3 (GOWN DISPOSABLE) ×1
GUIDEWIRE STR DUAL SENSOR (WIRE) ×6 IMPLANT
IV NS IRRIG 3000ML ARTHROMATIC (IV SOLUTION) ×3 IMPLANT
KIT TURNOVER CYSTO (KITS) ×3 IMPLANT
MANIFOLD NEPTUNE II (INSTRUMENTS) ×3 IMPLANT
PACK CYSTO AR (MISCELLANEOUS) ×3 IMPLANT
SET CYSTO W/LG BORE CLAMP LF (SET/KITS/TRAYS/PACK) ×3 IMPLANT
STENT URET 6FRX24 CONTOUR (STENTS) ×1 IMPLANT
STENT URET 6FRX26 CONTOUR (STENTS) IMPLANT
WATER STERILE IRR 1000ML POUR (IV SOLUTION) ×3 IMPLANT
WATER STERILE IRR 500ML POUR (IV SOLUTION) ×3 IMPLANT

## 2021-11-17 NOTE — Consult Note (Signed)
ANTICOAGULATION CONSULT NOTE  ? ?Pharmacy Consult for Heparin ?Indication: pulmonary embolus ? ?No Known Allergies ? ?Patient Measurements: ?Height: '4\' 11"'$  (149.9 cm) ?Weight: 81.5 kg (179 lb 10.8 oz) ?IBW/kg (Calculated) : 43.2 ?Heparin Dosing Weight: 62.3 kg ? ?Vital Signs: ?Temp: 98.3 ?F (36.8 ?C) (05/13 0800) ?Temp Source: Oral (05/13 0800) ?BP: 102/44 (05/13 1030) ?Pulse Rate: 93 (05/13 1030) ? ?Labs: ?Recent Labs  ?  11/16/21 ?1827 11/17/21 ?0109 11/17/21 ?3154 11/17/21 ?0950  ?HGB 9.7* 9.1*  --  8.4*  ?HCT 31.7* 29.4*  --  27.1*  ?PLT 208 198  --   --   ?APTT  --   --  171* 98*  ?LABPROT  --   --  15.6*  --   ?INR  --   --  1.3*  --   ?HEPARINUNFRC  --   --  0.47 0.45  ?CREATININE 0.94 0.87  --   --   ? ? ? ?Estimated Creatinine Clearance: 58.7 mL/min (by C-G formula based on SCr of 0.87 mg/dL). ? ? ?Medical History: ?Past Medical History:  ?Diagnosis Date  ? Acute bilateral deep vein thrombosis (DVT) of femoral veins ([place]) 01/02/2021  ? Acute massive pulmonary embolism ([place]) 08/25/2020  ? Last Assessment & Plan:  Formatting of this note might be different from the original. Post vena caval filter and on xarelto and O2  ? ARF (acute respiratory failure) ([place])   ? Cancer [facility])   ? endometrial  ? Cellulitis of left lower leg 06/20/2021  ? CHF (congestive heart failure) ([place])   ? COVID-19   ? Diabetes mellitus without complication ([place])   ? DVT (deep venous thrombosis) ([place]) 09/06/2020  ? High cholesterol   ? Hx of blood clots   ? Hyperlipidemia   ? Hypertension   ? IDA (iron deficiency anemia) 09/21/2020  ? Obesity   ? Pressure injury of skin 06/21/2021  ? Pulmonary embolism ([place])   ? Sepsis ([place])   ? Thrombocytopenia ([place])   ? Urinary tract infection 09/13/2021  ? ? ?Medications:  ?Medications Prior to Admission  ?Medication Sig Dispense Refill Last Dose  ? acetaminophen (TYLENOL) 500 MG tablet Take 500 mg by mouth every 6 (six) hours as needed for mild pain or fever.   prn at prn  ? Ascorbic Acid (VITAMIN C)  1000 MG tablet Take 1,000 mg by mouth daily.   11/16/2021 at 0900  ? BREO ELLIPTA 100-25 MCG/ACT AEPB Inhale 1 puff into the lungs daily.   11/16/2021 at 1700  ? Ferrous Sulfate (IRON) 325 (65 Fe) MG TABS Take 325 mg by mouth every other day.   11/15/2021 at 0900  ? furosemide (LASIX) 20 MG tablet Take 1 tablet (20 mg total) by mouth daily. (Patient taking differently: Take 20 mg by mouth every morning.) 30 tablet 11 11/16/2021 at 0900  ? loperamide (IMODIUM) 2 MG capsule Take 1 capsule (2 mg total) by mouth as needed for diarrhea or loose stools. 30 capsule 0 prn at prn  ? mometasone-formoterol (DULERA) 100-5 MCG/ACT AERO Inhale 2 puffs into the lungs 2 (two) times daily.   11/16/2021 at 1700  ? Multiple Vitamin (MULTIVITAMIN WITH MINERALS) TABS tablet Take 1 tablet by mouth daily.   11/16/2021 at 0900  ? pantoprazole (PROTONIX) 40 MG tablet Take 40 mg by mouth every morning.   11/16/2021 at 0900  ? rosuvastatin (CRESTOR) 10 MG tablet Take 1 tablet (10 mg total) by mouth daily. (Patient taking differently: Take 10 mg by mouth every morning.)  15 tablet 0 11/16/2021 at 0900  ? nystatin (MYCOSTATIN) 100000 UNIT/ML suspension Take 5 mLs (500,000 Units total) by mouth 3 (three) times daily. For 7-8 days (Patient not taking: Reported on 11/16/2021) 60 mL 0 Completed Course  ? XARELTO 20 MG TABS tablet Take 20 mg by mouth daily. (Patient not taking: Reported on 11/16/2021)   Not Taking  ? ?Scheduled:  ? Chlorhexidine Gluconate Cloth  6 each Topical Daily  ? insulin aspart  0-15 Units Subcutaneous TID WC  ? mometasone-formoterol  2 puff Inhalation BID  ? pantoprazole  40 mg Oral Daily  ? rosuvastatin  10 mg Oral Daily  ? sodium chloride flush  3 mL Intravenous Q12H  ? ?Infusions:  ? sodium chloride 20 mL/hr (11/17/21 0800)  ? cefTRIAXone (ROCEPHIN)  IV    ? heparin 1,000 Units/hr (11/17/21 0800)  ? ?PRN: acetaminophen **OR** acetaminophen, HYDROcodone-acetaminophen, morphine injection, ondansetron **OR** ondansetron (ZOFRAN)  IV ?Anti-infectives (From admission, onward)  ? ? Start     Dose/Rate Route Frequency Ordered Stop  ? 11/17/21 2200  cefTRIAXone (ROCEPHIN) 1 g in sodium chloride 0.9 % 100 mL IVPB       ? 1 g ?200 mL/hr over 30 Minutes Intravenous Every 24 hours 11/17/21 0512 11/21/21 2159  ? 11/16/21 2215  cefTRIAXone (ROCEPHIN) 2 g in sodium chloride 0.9 % 100 mL IVPB       ? 2 g ?200 mL/hr over 30 Minutes Intravenous  Once 11/16/21 2210 11/16/21 2306  ? ?  ? ? ?Assessment: ?Pharmacy consulted to start heparin for PE. Pt was on rivaroxaban, but per med rec states not taking. Pt reported having bleeding from the vagina and has a hx of GI bleeding. S/p thrombectomy and IVC filter placement in 2022. Unsure of last dose of rivaroxaban, will order both aPTT and heparin level. If baseline heparin level is falsely elevated use aPTT for monitoring.  ? ?5/13 0950 aPTT 98/HL 0.45  therapeutic and correlating, continue current rate ? ?Goal of Therapy:  ?Heparin level 0.3-0.7 units/ml ?aPTT 66-102 seconds ?Monitor platelets by anticoagulation protocol: Yes ?  ?Plan:  ?5/13 0950 aPTT 98/HL 0.45  therapeutic and correlating, continue current rate of 1000 units/hr and check confirmatory HL in 6 hrs.   ?Continue with HL levels, CBC per protocol ? ?Noralee Space, PharmD ?11/17/2021,11:01 AM ? ? ?

## 2021-11-17 NOTE — Consult Note (Addendum)
? ?Urology Consult ? ?Requesting physician: Max Sane MD ? ?Reason for consultation: Gross hematuria ? ? ?History of Present Illness: Courtney Mack is a 66 y.o. female with a history of sepsis/septic shock from a urinary source status post cystoscopy with left ureteral stent placement 09/11/2021.  She had a 2 mm left distal ureteral calculus.  She underwent left ureteroscopy on 4/11 and stent removal  on 4/12 with subsequent development of sepsis with a fever to 103 requiring urgent stent replacement on 4/12.   She had been on Xarelto for recurrent DVT's and a PE in 2/22 and 6/22. She had a thrombectomy and IVC filter placed.  She presented to the ER today with recurrent gross hematuria and a UA suggestive of infection with frequency, urgnecy and dysuria.  She had some tachycardia and tachypnea on admission but a normal WBC count and lactic acid and she is afebrile.  She has mild left flank discomfort.  A CT today shows the stent in good position but there is some bladder wall thickening and mild residual hydro.  She had a foley placed yesterday but voided around it.  A bladder scan today showed only 9m.    ? ? ?Past Medical History:  ?Diagnosis Date  ? Acute bilateral [place]) [place] 01/02/[place]  ? [place]ism [place] 08/25/2020  ? Last Assessment & Plan:  Formatting of this note might be different from the original. Post vena caval filter and on xarelto and O2  ? AR[place] (acute respiratory failure) ([place]   ? [facility]   ? endometrial  ? Cellulitis of left lower leg 06/20/2021  ? CHF (congestive heart failure) ([place]   ? COVID-19   ? Diabetes mellitus without complication ([place]   ? DVT (deep venous thrombosis) ([place] 09/06/2020  ? High cholesterol   ? Hx of blood clots   ? Hyperlipidemia   ? Hypertension   ? IDA (iron deficiency anemia) 09/21/2020  ? Obesity   ? Pressure injury of skin 06/21/2021  ? Pulmonary embolism ([place]   ? Sepsis ([place]   ?  Thrombocytopenia ([place]   ? Urinary tract infection 09/13/2021  ? ? ?Past Surgical History:  ?Procedure Laterality Date  ? COLONOSCOPY N/A 06/21/2021  ? Procedure: COLONOSCOPY;  Surgeon: Toledo, TBenay Pike MD;  Location: [facility] ENDOSCOPY;  Service: Gastroenterology;  Laterality: N/A;  ? CYSTOSCOPY W/ URETERAL STENT PLACEMENT Left 09/11/2021  ? Procedure: CYSTOSCOPY WITH RETROGRADE PYELOGRAM/URETERAL STENT PLACEMENT;  Surgeon: SAbbie Sons MD;  Location: [facility] ORS;  Service: Urology;  Laterality: Left;  ? CYSTOSCOPY WITH STENT PLACEMENT Left 10/17/2021  ? Procedure: CYSTOSCOPY WITH STENT PLACEMENT;  Surgeon: SAbbie Sons MD;  Location: [facility] ORS;  Service: Urology;  Laterality: Left;  ? CYSTOSCOPY WITH URETEROSCOPY, STONE BASKETRY AND STENT PLACEMENT Left 10/16/2021  ? Procedure: CYSTOSCOPY WITH URETEROSCOPY  AND STENT REMOVAL;  Surgeon: SAbbie Sons MD;  Location: [facility] ORS;  Service: Urology;  Laterality: Left;  ? ENTEROSCOPY N/A 08/17/2021  ? Procedure: ENTEROSCOPY;  Surgeon: VLin Landsman MD;  [facility];  Service: Gastroenterology;  Laterality: N/A;  ? ESOPHAGOGASTRODUODENOSCOPY N/A 06/21/2021  ? Procedure: ESOPHAGOGASTRODUODENOSCOPY (EGD);  Surgeon: Toledo, TBenay Pike MD;  Location: [facility] ENDOSCOPY;  Service: Gastroenterology;  Laterality: N/A;  ? ESOPHAGOGASTRODUODENOSCOPY (EGD) WITH PROPOFOL N/A 08/17/2021  ? Procedure: ESOPHAGOGASTRODUODENOSCOPY (EGD) WITH PROPOFOL;  Surgeon: VLin Landsman MD;  [facility];  Service: Gastroenterology;  Laterality: N/A;  ? ESOPHAGOGASTRODUODENOSCOPY (EGD) WITH PROPOFOL N/A  10/14/2021  ? Procedure: ESOPHAGOGASTRODUODENOSCOPY (EGD) WITH PROPOFOL;  Surgeon: [name], MD;  Location: [facility] ENDOSCOPY;  Service: Endoscopy;  Laterality: N/A;  ? GIVENS CAPSULE STUDY N/A 06/22/2021  ? Procedure: GIVENS CAPSULE STUDY;  Surgeon: Toledo, Benay Pike, MD;  Location: [facility] ENDOSCOPY;  Service: Gastroenterology;  Laterality: N/A;  ? IVC FILTER  INSERTION N/A 08/18/2020  ? Procedure: IVC FILTER INSERTION;  Surgeon: Katha Cabal, MD;  Location: [place] CV LAB;  Service: Cardiovascular;  Laterality: N/A;  ? PERIPHERAL VASCULAR THROMBECTOMY Bilateral 01/03/2021  ? Procedure: PERIPHERAL VASCULAR THROMBECTOMY;  Surgeon: Algernon Huxley, MD;  Location: [place] CV LAB;  Service: Cardiovascular;  Laterality: Bilateral;  ? PULMONARY THROMBECTOMY N/A 08/18/2020  ? Procedure: PULMONARY THROMBECTOMY;  Surgeon: Katha Cabal, MD;  Location: [place] CV LAB;  Service: Cardiovascular;  Laterality: N/A;  ? TUBAL LIGATION    ? VISCERAL ANGIOGRAPHY N/A 07/18/2021  ? Procedure: VISCERAL ANGIOGRAPHY;  Surgeon: Algernon Huxley, MD;  Location: [place] CV LAB;  Service: Cardiovascular;  Laterality: N/A;  ? ? ?Home Medications:  ?Current Meds  ?Medication Sig  ? acetaminophen (TYLENOL) 500 MG tablet Take 500 mg by mouth every 6 (six) hours as needed for mild pain or fever.  ? Ascorbic Acid (VITAMIN C) 1000 MG tablet Take 1,000 mg by mouth daily.  ? BREO ELLIPTA 100-25 MCG/ACT AEPB Inhale 1 puff into the lungs daily.  ? Ferrous Sulfate (IRON) 325 (65 Fe) MG TABS Take 325 mg by mouth every other day.  ? furosemide (LASIX) 20 MG tablet Take 1 tablet (20 mg total) by mouth daily. (Patient taking differently: Take 20 mg by mouth every morning.)  ? loperamide (IMODIUM) 2 MG capsule Take 1 capsule (2 mg total) by mouth as needed for diarrhea or loose stools.  ? mometasone-formoterol (DULERA) 100-5 MCG/ACT AERO Inhale 2 puffs into the lungs 2 (two) times daily.  ? Multiple Vitamin (MULTIVITAMIN WITH MINERALS) TABS tablet Take 1 tablet by mouth daily.  ? pantoprazole (PROTONIX) 40 MG tablet Take 40 mg by mouth every morning.  ? rosuvastatin (CRESTOR) 10 MG tablet Take 1 tablet (10 mg total) by mouth daily. (Patient taking differently: Take 10 mg by mouth every morning.)  ? ? ?Allergies: No Known Allergies ? ?Family History  ?Problem Relation Age of Onset  ?  Hypertension Mother   ? Diabetes Mother   ? Diabetes Father   ? Hypertension Father   ? High Cholesterol Father   ? Congestive Heart Failure Father   ? Breast cancer Cousin   ? ? ?Social History:  reports that she has never smoked. She has never used smokeless tobacco. She reports that she does not currently use alcohol. She reports that she does not currently use drugs. ? ?ROS: ?As per the HPI ? ?Physical Exam:  ?Vital signs in last 24 hours: ?Temp:  [97.6 ?F (36.4 ?C)-98.5 ?F (36.9 ?C)] 97.6 ?F (36.4 ?C) (05/13 1200) ?Pulse Rate:  [81-104] 92 (05/13 1400) ?Resp:  [16-27] 23 (05/13 1400) ?BP: (83-126)/(44-77) 126/50 (05/13 1400) ?SpO2:  [94 %-100 %] 100 % (05/13 1400) ?Weight:  [78.5 kg-81.5 kg] 81.5 kg (05/13 0334) ?Constitutional:  Alert, oriented but no acute distress ?Cardiovascular: Normal rate and regular rhythm ?Respiratory: Elevated respiratory rate.  No respiratory distress. ?GI: Abdomen is soft, nondistended, no abdominal masses, there is mild left flank tenderness. ?GU: No clots on Purewick.  Urine is dark pink.  ? ? ?Laboratory Data:  ?Recent Labs  ?  11/16/21 ?1827 11/17/21 ?0109  11/17/21 ?0950 11/17/21 ?1209  ?WBC 6.0 7.4  --  4.7  ?HGB 9.7* 9.1* 8.4* 7.7*  ?HCT 31.7* 29.4* 27.1* 24.6*  ? ?Recent Labs  ?  11/16/21 ?1827 11/17/21 ?0109  ?NA 137 136  ?K 3.4* 3.2*  ?CL 103 100  ?CO2 25 29  ?GLUCOSE 138* 113*  ?BUN 22 21  ?CREATININE 0.94 0.87  ?CALCIUM 8.8* 8.6*  ? ?Recent Labs  ?  11/17/21 ?3086  ?INR 1.3*  ? ?No results for input(s): LABURIN in the last 72 hours. ?Results for orders placed or performed during the hospital encounter of 10/16/21  ?Resp Panel by RT-PCR (Flu A&B, Covid) Nasopharyngeal Swab     Status: None  ? Collection Time: 10/16/21 10:35 PM  ? Specimen: Nasopharyngeal Swab; Nasopharyngeal(NP) swabs in vial transport medium  ?Result Value Ref Range Status  ? SARS Coronavirus 2 by RT PCR NEGATIVE NEGATIVE Final  ?  Comment: (NOTE) ?SARS-CoV-2 target nucleic acids are NOT DETECTED. ? ?The  SARS-CoV-2 RNA is generally detectable in upper respiratory ?specimens during the acute phase of infection. The lowest ?concentration of SARS-CoV-2 viral copies this assay can detect is ?138 copies/mL. A negative result does not p

## 2021-11-17 NOTE — Op Note (Signed)
Procedure: 1.  Cystoscopy with removal and replacement of left double-J stent. ?2.  Fulguration of bladder neck bleeding. ?3.  Application of fluoroscopy ? ?Preop diagnosis: Left distal ureteral obstruction and gross hematuria. ? 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 ? ?Surgeon: Dr. Irine Seal ? ?Anesthesia: MAC. ? ?Drain: 6 [place] by 24 cm left contour double-J stent. ? ?Specimen: None. ? ?EBL: None. ? ?Complications: None. ? ?Indications: The patient is a 66 year old female who was admitted with gross hematuria and possible UTI with a history of recent placement of a left double-J stent for ureteral obstruction by Dr. Bernardo Heater in early April.  CT scan this admission shows persistent hydronephrosis with delayed excretion from the left collecting system consistent with an obstructed stent.  She is also had persistent gross hematuria. ? ?Procedure: She had been receiving Rocephin and was taken operating room where sedation was given as needed.  Positioning was difficult due to fixation of both hips but we were able to successfully get her into a very low lithotomy with minimal abduction that was barely sufficient for the procedure.  She was prepped with Betadine scrub and draped in the usual sterile fashion. ? ?An initial attempt to pass the cystoscope was difficult due to the narrow lithotomy position and altered anatomy with a narrow introitus and tissue fixation from the prior radiation and the first attempt actually passed into the rectum which was recognized by the presence of stool.  Because of this I exchanged out the cystoscopy set. ? ?I then identified the urethra which was difficult due to pelvic fixation and the narrow lithotomy but I was able to successfully pass the scope and the stent was identified at the left ureteral orifice.  The bladder urine was bloody and after a few rounds of irrigation I was able to  identify oozing from the right left and anterior bladder neck that appeared to be secondary to radiation effect from her prior endometrial cancer treatment.  The remainder of the bladder was smooth and pale without lesions.  The right was unremarkable.  Ureteral orifice the stent was grasped with grasping forceps and pulled to the meatus.  A sensor wire was advanced through the stent to the kidney under fluoroscopic guidance and the stent was removed.  The cystoscope was then reinserted over the wire and a fresh 6 [place] by 24 cm contour double-J stent was advanced the kidney under fluoroscopic guidance.  The wire was removed, leaving a good coil in the kidney and a good coil in the bladder. ? ?Because she was having active oozing and persistent hematuria I changed the irrigation from saline to water and used the Bugbee electrode to fulgurate the areas of active oozing.  After completion of fulguration there was no bleeding.  The scope was removed and she voided incontinently.  She was taken down from the lithotomy position and moved recovery in stable condition.  There were no complications. ?

## 2021-11-17 NOTE — Consult Note (Signed)
ANTICOAGULATION CONSULT NOTE  ? ?Pharmacy Consult for Heparin ?Indication: pulmonary embolus ? ?No Known Allergies ? ?Patient Measurements: ?Height: '4\' 11"'$  (149.9 cm) ?Weight: 81.5 kg (179 lb 10.8 oz) ?IBW/kg (Calculated) : 43.2 ?Heparin Dosing Weight: 62.3 kg ? ?Vital Signs: ?Temp: 98.1 ?F (36.7 ?C) (05/13 1548) ?Temp Source: Oral (05/13 1548) ?BP: 91/41 (05/13 1548) ?Pulse Rate: 80 (05/13 1548) ? ?Labs: ?Recent Labs  ?  11/16/21 ?1827 11/17/21 ?0109 11/17/21 ?8144 11/17/21 ?0950 11/17/21 ?1209 11/17/21 ?1600  ?HGB 9.7* 9.1*  --  8.4* 7.7*  --   ?HCT 31.7* 29.4*  --  27.1* 24.6*  --   ?PLT 208 198  --   --  177  --   ?APTT  --   --  171* 98*  --   --   ?LABPROT  --   --  15.6*  --   --   --   ?INR  --   --  1.3*  --   --   --   ?HEPARINUNFRC  --   --  0.47 0.45  --  0.36  ?CREATININE 0.94 0.87  --   --   --   --   ? ? ? ?Estimated Creatinine Clearance: 58.7 mL/min (by C-G formula based on SCr of 0.87 mg/dL). ? ? ?Medical History: ?Past Medical History:  ?Diagnosis Date  ? Acute bilateral deep vein thrombosis (DVT) of femoral veins ([place]) 01/02/2021  ? Acute massive pulmonary embolism ([place]) 08/25/2020  ? Last Assessment & Plan:  Formatting of this note might be different from the original. Post vena caval filter and on xarelto and O2  ? ARF (acute respiratory failure) ([place])   ? Cancer [facility])   ? endometrial  ? Cellulitis of left lower leg 06/20/2021  ? CHF (congestive heart failure) ([place])   ? COVID-19   ? Diabetes mellitus without complication ([place])   ? DVT (deep venous thrombosis) ([employer] [facility]) 09/06/2020  ? High cholesterol   ? Hx of blood clots   ? Hyperlipidemia   ? Hypertension   ? IDA (iron deficiency anemia) 09/21/2020  ? Obesity   ? Pressure injury of skin 06/21/2021  ? Pulmonary embolism ([place])   ? Sepsis ([place])   ? Thrombocytopenia ([place])   ? Urinary tract infection 09/13/2021  ? ? ?Medications:  ?Medications Prior to Admission  ?Medication Sig Dispense Refill Last Dose  ? acetaminophen (TYLENOL) 500 MG tablet Take 500  mg by mouth every 6 (six) hours as needed for mild pain or fever.   prn at prn  ? Ascorbic Acid (VITAMIN C) 1000 MG tablet Take 1,000 mg by mouth daily.   11/16/2021 at 0900  ? BREO ELLIPTA 100-25 MCG/ACT AEPB Inhale 1 puff into the lungs daily.   11/16/2021 at 1700  ? Ferrous Sulfate (IRON) 325 (65 Fe) MG TABS Take 325 mg by mouth every other day.   11/15/2021 at 0900  ? furosemide (LASIX) 20 MG tablet Take 1 tablet (20 mg total) by mouth daily. (Patient taking differently: Take 20 mg by mouth every morning.) 30 tablet 11 11/16/2021 at 0900  ? loperamide (IMODIUM) 2 MG capsule Take 1 capsule (2 mg total) by mouth as needed for diarrhea or loose stools. 30 capsule 0 prn at prn  ? mometasone-formoterol (DULERA) 100-5 MCG/ACT AERO Inhale 2 puffs into the lungs 2 (two) times daily.   11/16/2021 at 1700  ? Multiple Vitamin (MULTIVITAMIN WITH MINERALS) TABS tablet Take 1 tablet by mouth daily.   11/16/2021 at 0900  ?  pantoprazole (PROTONIX) 40 MG tablet Take 40 mg by mouth every morning.   11/16/2021 at 0900  ? rosuvastatin (CRESTOR) 10 MG tablet Take 1 tablet (10 mg total) by mouth daily. (Patient taking differently: Take 10 mg by mouth every morning.) 15 tablet 0 11/16/2021 at 0900  ? nystatin (MYCOSTATIN) 100000 UNIT/ML suspension Take 5 mLs (500,000 Units total) by mouth 3 (three) times daily. For 7-8 days (Patient not taking: Reported on 11/16/2021) 60 mL 0 Completed Course  ? XARELTO 20 MG TABS tablet Take 20 mg by mouth daily. (Patient not taking: Reported on 11/16/2021)   Not Taking  ? ?Scheduled:  ? Chlorhexidine Gluconate Cloth  6 each Topical Daily  ? insulin aspart  0-15 Units Subcutaneous TID WC  ? mometasone-formoterol  2 puff Inhalation BID  ? pantoprazole  40 mg Oral Daily  ? rosuvastatin  10 mg Oral Daily  ? sodium chloride flush  3 mL Intravenous Q12H  ? ?Infusions:  ? sodium chloride Stopped (11/17/21 0805)  ? cefTRIAXone (ROCEPHIN)  IV    ? heparin 1,000 Units/hr (11/17/21 1200)  ? ?PRN: acetaminophen **OR**  [DISCONTINUED] acetaminophen, HYDROcodone-acetaminophen, ondansetron **OR** ondansetron (ZOFRAN) IV ?Anti-infectives (From admission, onward)  ? ? Start     Dose/Rate Route Frequency Ordered Stop  ? 11/17/21 2200  cefTRIAXone (ROCEPHIN) 1 g in sodium chloride 0.9 % 100 mL IVPB       ? 1 g ?200 mL/hr over 30 Minutes Intravenous Every 24 hours 11/17/21 0512 11/21/21 2159  ? 11/16/21 2215  cefTRIAXone (ROCEPHIN) 2 g in sodium chloride 0.9 % 100 mL IVPB       ? 2 g ?200 mL/hr over 30 Minutes Intravenous  Once 11/16/21 2210 11/16/21 2306  ? ?  ? ? ?Assessment: ?Pharmacy consulted to start heparin for PE. Pt was on rivaroxaban, but per med rec states not taking. Pt reported having bleeding from the vagina and has a hx of GI bleeding. S/p thrombectomy and IVC filter placement in 2022. Unsure of last dose of rivaroxaban, will order both aPTT and heparin level. If baseline heparin level is falsely elevated use aPTT for monitoring.  ? ?5/13 0950 aPTT 98/HL 0.45  ?5/13 1600 HL 0.36 ? ?Goal of Therapy:  ?Heparin level 0.3-0.7 units/ml ?Monitor platelets by anticoagulation protocol: Yes ?  ?Plan:  ?Heparin level is therapeutic x 2. Will continue current rate of 1000 units/hr and check HL with AM labs. CBC per protocol ? ?Sherilyn Banker, PharmD ?11/17/2021,4:47 PM ? ? ?

## 2021-11-17 NOTE — Transfer of Care (Signed)
Immediate Anesthesia Transfer of Care Note ? ?Patient: Courtney Mack ? ?Procedure(s) Performed: CYSTOSCOPY WITH STENT REPLACEMENT (Left) ?[name] ? ?Patient Location: PACU ? ?Anesthesia Type:MAC ? ?Level of Consciousness: awake, alert  and oriented ? ?Airway & Oxygen Therapy: Patient Spontanous Breathing and Patient connected to face mask oxygen ? ?Post-op Assessment: Report given to RN, Post -op Vital signs reviewed and stable and Patient moving all extremities ? ?Post vital signs: Reviewed and stable ? ?Last Vitals:  ?Vitals Value Taken Time  ?BP 141/60 11/17/21 2248  ?Temp 36.1 ?C 11/17/21 2248  ?Pulse 83 11/17/21 2252  ?Resp 21 11/17/21 2252  ?SpO2 96 % 11/17/21 2252  ?Vitals shown include unvalidated device data. ? ?Last Pain:  ?Vitals:  ? 11/17/21 2248  ?TempSrc:   ?PainSc: 8   ?   ? ?  ? ?Complications: No notable events documented. ?

## 2021-11-17 NOTE — Progress Notes (Signed)
Cross Cover ?Informed by nurse patient CTA chest has + PE  ?Given clot history Heparin per pharmacy consult ordered ?

## 2021-11-17 NOTE — Plan of Care (Signed)
  Problem: Clinical Measurements: Goal: Will remain free from infection Outcome: Progressing Goal: Respiratory complications will improve Outcome: Progressing Goal: Cardiovascular complication will be avoided Outcome: Progressing   Problem: Pain Managment: Goal: General experience of comfort will improve Outcome: Progressing   Problem: Safety: Goal: Ability to remain free from injury will improve Outcome: Progressing   

## 2021-11-17 NOTE — Progress Notes (Signed)
Reported to this nurse from anesthesia per Dr Jeffie Pollock, hold heparin for 6 hours post procedure. Restart at 0450. Also spoke with Dr Jeffie Pollock and confirmed 6 hour hold.  ?

## 2021-11-17 NOTE — Progress Notes (Signed)
Report given to RN for rm 125, Rn attempted to notify pt contact Kyra Manges per pt request. Pt family did not answer. Pt aware.  ? ?

## 2021-11-17 NOTE — Consult Note (Signed)
ANTICOAGULATION CONSULT NOTE - Initial Consult ? ?Pharmacy Consult for Heparin ?Indication: pulmonary embolus ? ?No Known Allergies ? ?Patient Measurements: ?Height: '4\' 11"'$  (149.9 cm) ?Weight: 81.5 kg (179 lb 10.8 oz) ?IBW/kg (Calculated) : 43.2 ?Heparin Dosing Weight: 62.3 kg ? ?Vital Signs: ?Temp: 98.3 ?F (36.8 ?C) (05/13 0051) ?Temp Source: Oral (05/13 0051) ?BP: 111/57 (05/13 0200) ?Pulse Rate: 93 (05/13 0200) ? ?Labs: ?Recent Labs  ?  11/16/21 ?1827 11/17/21 ?0109  ?HGB 9.7* 9.1*  ?HCT 31.7* 29.4*  ?PLT 208 198  ?CREATININE 0.94 0.87  ? ? ?Estimated Creatinine Clearance: 58.7 mL/min (by C-G formula based on SCr of 0.87 mg/dL). ? ? ?Medical History: ?Past Medical History:  ?Diagnosis Date  ? Acute bilateral deep vein thrombosis (DVT) of femoral veins ([place]) 01/02/2021  ? Acute massive pulmonary embolism ([place]) 08/25/2020  ? Last Assessment & Plan:  Formatting of this note might be different from the original. Post vena caval filter and on xarelto and O2  ? ARF (acute respiratory failure) ([place])   ? Cancer [facility])   ? endometrial  ? Cellulitis of left lower leg 06/20/2021  ? CHF (congestive heart failure) ([place])   ? COVID-19   ? Diabetes mellitus without complication ([place] [facility] [place])   ? DVT (deep venous thrombosis) ([place]) 09/06/2020  ? High cholesterol   ? Hx of blood clots   ? Hyperlipidemia   ? Hypertension   ? IDA (iron deficiency anemia) 09/21/2020  ? Obesity   ? Pressure injury of skin 06/21/2021  ? Pulmonary embolism ([place])   ? Sepsis ([place])   ? Thrombocytopenia ([place])   ? Urinary tract infection 09/13/2021  ? ? ?Medications:  ?Medications Prior to Admission  ?Medication Sig Dispense Refill Last Dose  ? acetaminophen (TYLENOL) 500 MG tablet Take 500 mg by mouth every 6 (six) hours as needed for mild pain or fever.   prn at prn  ? Ascorbic Acid (VITAMIN C) 1000 MG tablet Take 1,000 mg by mouth daily.   11/16/2021 at 0900  ? BREO ELLIPTA 100-25 MCG/ACT AEPB Inhale 1 puff into the lungs daily.   11/16/2021 at 1700  ? Ferrous  Sulfate (IRON) 325 (65 Fe) MG TABS Take 325 mg by mouth every other day.   11/15/2021 at 0900  ? furosemide (LASIX) 20 MG tablet Take 1 tablet (20 mg total) by mouth daily. (Patient taking differently: Take 20 mg by mouth every morning.) 30 tablet 11 11/16/2021 at 0900  ? loperamide (IMODIUM) 2 MG capsule Take 1 capsule (2 mg total) by mouth as needed for diarrhea or loose stools. 30 capsule 0 prn at prn  ? mometasone-formoterol (DULERA) 100-5 MCG/ACT AERO Inhale 2 puffs into the lungs 2 (two) times daily.   11/16/2021 at 1700  ? Multiple Vitamin (MULTIVITAMIN WITH MINERALS) TABS tablet Take 1 tablet by mouth daily.   11/16/2021 at 0900  ? pantoprazole (PROTONIX) 40 MG tablet Take 40 mg by mouth every morning.   11/16/2021 at 0900  ? rosuvastatin (CRESTOR) 10 MG tablet Take 1 tablet (10 mg total) by mouth daily. (Patient taking differently: Take 10 mg by mouth every morning.) 15 tablet 0 11/16/2021 at 0900  ? nystatin (MYCOSTATIN) 100000 UNIT/ML suspension Take 5 mLs (500,000 Units total) by mouth 3 (three) times daily. For 7-8 days (Patient not taking: Reported on 11/16/2021) 60 mL 0 Completed Course  ? XARELTO 20 MG TABS tablet Take 20 mg by mouth daily. (Patient not taking: Reported on 11/16/2021)   Not Taking  ? ?Scheduled:  ?  insulin aspart  0-15 Units Subcutaneous TID WC  ? mometasone-formoterol  2 puff Inhalation BID  ? pantoprazole  40 mg Oral Daily  ? rosuvastatin  10 mg Oral Daily  ? sodium chloride flush  3 mL Intravenous Q12H  ? ?Infusions:  ? sodium chloride 20 mL/hr (11/17/21 0050)  ? ?PRN: acetaminophen **OR** acetaminophen, HYDROcodone-acetaminophen, morphine injection, ondansetron **OR** ondansetron (ZOFRAN) IV ?Anti-infectives (From admission, onward)  ? ? Start     Dose/Rate Route Frequency Ordered Stop  ? 11/16/21 2215  cefTRIAXone (ROCEPHIN) 2 g in sodium chloride 0.9 % 100 mL IVPB       ? 2 g ?200 mL/hr over 30 Minutes Intravenous  Once 11/16/21 2210 11/16/21 2306  ? ?  ? ? ?Assessment: ?Pharmacy  consulted to start heparin for PE. Pt was on rivaroxaban, but per med rec states not taking. Pt reported having bleeding from the vagina and has a hx of GI bleeding. S/p thrombectomy and IVC filter placement in 2022. Unsure of last dose of rivaroxaban, will order both aPTT and heparin level. If baseline heparin level is falsely elevated use aPTT for monitoring.  ? ?Goal of Therapy:  ?Heparin level 0.3-0.7 units/ml ?aPTT 66-102 seconds ?Monitor platelets by anticoagulation protocol: Yes ?  ?Plan:  ?Give 3700 units bolus x 1 ?Start heparin infusion at 1000 units/hr ?Check anti-Xa level in 6 hours and daily while on heparin ?Continue to monitor H&H and platelets ? ?Oswald Hillock, PharmD, BCPS ?11/17/2021,3:31 AM ? ? ?

## 2021-11-17 NOTE — Progress Notes (Signed)
?Progress Note ? ? ?Patient: Courtney Mack YHC:[id number] DOB: Apr 28, 1956 DOA: 11/16/2021     1 ?DOS: the patient was seen and examined on 11/17/2021 ?  ?Brief hospital course: ?66 y.o. female with medical history significant of recurrent DVT and massive PE and February 2022 and June 2022 on Xarelto status post thrombectomy and IVC filter placement, history of GI bleed in December 2022 and history of endometrial cancer, heart failure, COVID, iron deficiency anemia, recurrent UTIs admitted for recurrent gross hematuria and anemia being on xarelto which is held and is on heparin drip for now. CT chest showed small PE on right ? ?5/13: left ureteral obstruction seen on CT per Urology, planning stent exchange later tonight. 1 PRBC transfusion for Hb 7.7 Transfer to floor ? ? ?Assessment and Plan: ?* Gross hematuria ?D/w Dr. Jeffie Pollock. He feels there may be obstruction on left per his review of CT and planning stent exchange later tonight. xarelto on hold and is on heparin drip ? ? ?Hydronephrosis ?Plan for stent exchange later tonight per Dr Jeffie Pollock ? ?UTI (urinary tract infection) ?Will continue rocephin for now ? ?Sepsis ([place]) ?Blood pressure 107/63, pulse 88, temperature 98.5 ?F (36.9 ?C), temperature source Oral, resp. rate (!) 23, height '4\' 10"'$  (1.473 m), weight 78.5 kg, SpO2 96 %. ?sepsis resolved with treatment. Continue Abx. Stent exchange later tonight. Can transfer to floors with off unit tele ? ?Abdominal pain ?Likely due to stent issue. Getting exchange later tonight ? ?Thrombocytopenia (HCC)-resolved as of 11/17/2021 ?CBC ?   ?Component Value Date/Time  ? WBC 6.0 11/16/2021 1827  ? RBC 3.74 (L) 11/16/2021 1827  ? HGB 9.7 (L) 11/16/2021 1827  ? HCT 31.7 (L) 11/16/2021 1827  ? PLT 208 11/16/2021 1827  ? MCV 84.8 11/16/2021 1827  ? MCH 25.9 (L) 11/16/2021 1827  ? MCHC 30.6 11/16/2021 1827  ? RDW 22.8 (H) 11/16/2021 1827  ? LYMPHSABS 1.2 11/16/2021 1827  ? MONOABS 0.4 11/16/2021 1827  ? EOSABS 0.3 11/16/2021 1827  ?  BASOSABS 0.0 11/16/2021 1827  ?h/o thrombocytopenia stable currently.  ? ? ? ?Hypertension ?monitor ? ?Diabetes mellitus without complication ([place]) ?SSI  ? ? ?Endometrial cancer ([employer]) ?Will c/s onco ? ?IDA (iron deficiency anemia) ? ?  Latest Ref Rng & Units 11/17/2021  ? 12:09 PM 11/17/2021  ?  9:50 AM 11/17/2021  ?  1:09 AM  ?CBC  ?WBC 4.0 - 10.5 K/uL 4.7    7.4    ?Hemoglobin 12.0 - 15.0 g/dL 7.7   8.4   9.1    ?Hematocrit 36.0 - 46.0 % 24.6   27.1   29.4    ?Platelets 150 - 400 K/uL 177    198    ?  ?1 PRBC transfusion ordered for Hb 7.7 - likely due to hematuria ? ? ?CHF (congestive heart failure) ([place]) ?Well compensated at this time. Monitor fluids balance ?Net IO Since Admission: -107.59 mL [11/17/21 1659] ? ? ?Hx of blood clots ?CT chest showed small PE in the posterior right lower lobe. On heparin drip for now. Will c/s onco in am to decide anticoagulation plans ? ? ?Gastric antral vascular ectasia ?Continue PPI.  ? ? ? ? ? ?  ? ?Subjective: continues to have hematuria ? ?Physical Exam: ?Vitals:  ? 11/17/21 1300 11/17/21 1400 11/17/21 1518 11/17/21 1548  ?BP: (!) 102/52 (!) 126/50 (!) 90/53 (!) 91/41  ?Pulse: 81 92 85 80  ?Resp: 20 (!) '23 16 18  '$ ?Temp:   97.7 ?F (36.5 ?C)  98.1 ?F (36.7 ?C)  ?TempSrc:    Oral  ?SpO2: 99% 100% 94% 97%  ?Weight:      ?Height:      ? ?GENERAL:?66 y f lying in the bed comfortable. ?HEAD: Normocephalic, atraumatic.  ?EYES: Pupils equal, round, reactive to light. ?No scleral icterus.  ?MOUTH: Moist mucosal membrane. ?NECK: Supple. No thyromegaly. No nodules. No JVD.  ?PULMONARY: CTA b/l ?CARDIOVASCULAR: S1 and S2. Regular rate and rhythm. No murmurs, rubs, or gallops.  ?GASTROINTESTINAL: Soft, benign ?GU - hematirua ?NEUROLOGIC: alert and awake, non-focal ?SKIN:intact,warm,dry ? ?Data Reviewed: ? ?Hb 7.7 ? ?Family Communication: none ? ?Disposition: ?Status is: Inpatient ?Remains inpatient appropriate because: getting stent exchange and transfusion today ? ? Planned Discharge  Destination: Home with Home Health ? ? ? ?Time spent: 35 minutes ? ?Author: Max Sane, MD ?11/17/2021 5:00 PM ? ?For on call review [email or web address].  ?

## 2021-11-17 NOTE — Anesthesia Preprocedure Evaluation (Addendum)
Anesthesia Evaluation  ?Patient identified by MRN, date of birth, ID band ?Patient awake ? ? ? ?Reviewed: ?Allergy & Precautions, NPO status , Patient's Chart, lab work & pertinent test results ? ?History of Anesthesia Complications ?Negative for: history of anesthetic complications ? ?Airway ?Mallampati: III ? ? ? ? ? ? Dental ? ?(+) Upper Dentures, Poor Dentition, Missing ?  ?Pulmonary ?shortness of breath,  ?Chronic cough ? ?CTA chest has + PE : ?Small pulmonary embolus seen in the posterior right lower lobe ?pulmonary arterial branch. No evidence of right heart strain ?  ?breath sounds clear to auscultation ?+ wheezing ? ? ? ? ? Cardiovascular ?Exercise Tolerance: Poor ?hypertension, +CHF (diastolic) and + DOE  ? ?Rhythm:Regular Rate:Normal ?+ Peripheral Edema ?Hx DVT and PE on Xarelto ? ?ECG 10/16/21: ST (HR 124); QTC 427; low voltage precordial leads; no st elevation ? ?Echo 09/12/21: ?1. Left ventricular ejection fraction, by estimation, is 55 to 60%. The left ventricle has normal function. The left ventricle has no regional wall motion abnormalities. There is mild asymmetric left ventricular hypertrophy of the basal-septal segment.  ?Left ventricular diastolic parameters were normal.  ??2. Right ventricular systolic function is normal. The right ventricular size is normal.  ?  ?Neuro/Psych ?negative neurological ROS ? negative psych ROS  ? GI/Hepatic ?PUD, Recurrent GI bleed s/p transfusions last admission 4/7-4/9 ?  ?Endo/Other  ?diabetes, Type 2Class 3 obesity ? Renal/GU ?Renal disease (nephrolithiasis s/p stent 10/16/21)Residual hydronephrosis  ? ?  ?Musculoskeletal ? ? Abdominal ?Normal abdominal exam  (+)   ?Peds ? Hematology ? ?(+) Blood dyscrasia, anemia , Anticoagulation for history of DVT/PE 2022, Post vena caval filter    ?Anesthesia Other Findings ?Per urology admission note: ?Female with a history of sepsis/septic shock from a urinary source status post cystoscopy  with left ureteral stent placement 09/11/2021.  She had a 2 mm left distal ureteral calculus.  She underwent left ureteroscopy on 4/11 and stent removal  on 4/12 with subsequent development of sepsis with a fever to 103 requiring urgent stent replacement on 4/12.   She had been on Xarelto for recurrent DVT's and a PE in 2/22 and 6/22. She had a thrombectomy and IVC filter placed.  She presented to the ER today with recurrent gross hematuria and a UA suggestive of infection with frequency, urgnecy and dysuria. Pt with Left ureteral obstruction. Anticoagulation has been changed to heparin.  ? ?In addition, she has coughing and shortness of breath and leg swelling. Internal medicine documented JVD and rhales on exam and is judicious with fluid replacement. Sepsis criteria was meet.  ? ?History of DVT, PE ?Has had a history of recurrent DVT PE, will need indefinite anticoagulation ?Likely exacerbated by immobility, lymphedema, prior history of DVT and PE ?She is on Xarelto ?? ?History of GI bleed, chronic anemia ?Has had numerous EGD, colonoscopy procedures, followed by GI ?Has had numerous transfusions in the past ?Has chronic anemia which will exacerbate CHF symptoms ?? ?Morbid obesity ?Calorie restriction recommended, unable to exercise secondary to immobility ?? ?CHF, diastolic ?Normal ejection fraction on recent echo March 2023 ? Lasix 20 daily ? ? ?Hypokalemia ? Reproductive/Obstetrics ?Uterine CA ? ?  ? ? ? ? ? ? ? ? ? ? ? ? ? ?  ?  ? ? ? ? ? ? ?Anesthesia Physical ? ?Anesthesia Plan ? ?ASA: 4 ? ?Anesthesia Plan: MAC  ? ?Post-op Pain Management:   ? ?Induction: Intravenous ? ?PONV Risk Score and Plan: 2 and Ondansetron ? ?  Airway Management Planned: Natural Airway and Simple Face Mask ? ?Additional Equipment:  ? ?Intra-op Plan:  ? ?Post-operative Plan:  ? ?Informed Consent: I have reviewed the patients History and Physical, chart, labs and discussed the procedure including the risks, benefits and alternatives for  the proposed anesthesia with the patient or authorized representative who has indicated his/her understanding and acceptance.  ? ? ? ?Dental advisory given ? ?Plan Discussed with: CRNA ? ?Anesthesia Plan Comments:   ? ? ? ? ? ?Anesthesia Quick Evaluation ? ?

## 2021-11-17 NOTE — Hospital Course (Addendum)
66 y.o. female with medical history significant of recurrent DVT and massive PE and February 2022 and June 2022 on Xarelto status post thrombectomy and IVC filter placement, history of GI bleed in December 2022 and history of endometrial cancer, heart failure, COVID, iron deficiency anemia, recurrent UTIs admitted for recurrent gross hematuria and anemia being on xarelto which is held and is on heparin drip for now. CT chest showed small PE on right ? ?5/13: left ureteral obstruction seen on CT per Urology, planning stent exchange later tonight. 1 PRBC transfusion for Hb 7.7 Transfer to floor ?5/14: S/p cystoscopy with removal and replacement of left double-J stent.  Fulguration of bladder neck bleeding by urology last night.  Oncology seen and recommended Xarelto 10 mg for anticoagulation considering bleeding risk ?

## 2021-11-18 ENCOUNTER — Encounter: Payer: Self-pay | Admitting: Urology

## 2021-11-18 DIAGNOSIS — R310 Gross hematuria: Secondary | ICD-10-CM | POA: Diagnosis not present

## 2021-11-18 DIAGNOSIS — Q6211 Congenital occlusion of ureteropelvic junction: Secondary | ICD-10-CM | POA: Diagnosis not present

## 2021-11-18 DIAGNOSIS — N342 Other urethritis: Secondary | ICD-10-CM | POA: Diagnosis not present

## 2021-11-18 DIAGNOSIS — A419 Sepsis, unspecified organism: Secondary | ICD-10-CM | POA: Diagnosis not present

## 2021-11-18 LAB — URINE CULTURE: Culture: NO GROWTH

## 2021-11-18 LAB — CBC
HCT: 28.7 % — ABNORMAL LOW (ref 36.0–46.0)
Hemoglobin: 9.1 g/dL — ABNORMAL LOW (ref 12.0–15.0)
MCH: 27 pg (ref 26.0–34.0)
MCHC: 31.7 g/dL (ref 30.0–36.0)
MCV: 85.2 fL (ref 80.0–100.0)
Platelets: 175 10*3/uL (ref 150–400)
RBC: 3.37 MIL/uL — ABNORMAL LOW (ref 3.87–5.11)
RDW: 22.1 % — ABNORMAL HIGH (ref 11.5–15.5)
WBC: 4.6 10*3/uL (ref 4.0–10.5)
nRBC: 0 % (ref 0.0–0.2)

## 2021-11-18 LAB — HEPARIN LEVEL (UNFRACTIONATED): Heparin Unfractionated: <0.1 IU/mL — ABNORMAL LOW (ref 0.30–0.70)

## 2021-11-18 LAB — HEMOGLOBIN AND HEMATOCRIT, BLOOD
HCT: 32.5 % — ABNORMAL LOW (ref 36.0–46.0)
Hemoglobin: 10 g/dL — ABNORMAL LOW (ref 12.0–15.0)

## 2021-11-18 LAB — BASIC METABOLIC PANEL
Anion gap: 5 (ref 5–15)
BUN: 19 mg/dL (ref 8–23)
CO2: 25 mmol/L (ref 22–32)
Calcium: 8.4 mg/dL — ABNORMAL LOW (ref 8.9–10.3)
Chloride: 109 mmol/L (ref 98–111)
Creatinine, Ser: 0.74 mg/dL (ref 0.44–1.00)
GFR, Estimated: >60 mL/min (ref >60)
Glucose, Bld: 112 mg/dL — ABNORMAL HIGH (ref 70–99)
Potassium: 3.9 mmol/L (ref 3.5–5.1)
Sodium: 139 mmol/L (ref 135–145)

## 2021-11-18 LAB — GLUCOSE, CAPILLARY
Glucose-Capillary: 118 mg/dL — ABNORMAL HIGH (ref 70–99)
Glucose-Capillary: 151 mg/dL — ABNORMAL HIGH (ref 70–99)
Glucose-Capillary: 93 mg/dL (ref 70–99)
Glucose-Capillary: 120 mg/dL — ABNORMAL HIGH (ref 70–99)

## 2021-11-18 MED ORDER — RIVAROXABAN 10 MG PO TABS
10.0000 mg | ORAL_TABLET | Freq: Every day | ORAL | Status: DC
  Administered 2021-11-18 – 2021-11-19 (×2): 10 mg via ORAL
  Filled 2021-11-18 (×2): qty 1

## 2021-11-18 NOTE — Assessment & Plan Note (Signed)
Follow-up with Dr. Tasia Catchings as an outpatient ?

## 2021-11-18 NOTE — Assessment & Plan Note (Signed)
CT chest showed small PE in the posterior right lower lobe. On heparin drip for now.  Discussed with oncology Dr. Rogue Bussing recommends trying Xarelto 10 mg.  Considering bleeding and clotting risk, this may be best viable option for now ? ?

## 2021-11-18 NOTE — Assessment & Plan Note (Signed)
Status post stent exchange last evening by urology ?

## 2021-11-18 NOTE — Assessment & Plan Note (Signed)
Continue PPI ?

## 2021-11-18 NOTE — Assessment & Plan Note (Signed)
Blood pressure (!) 103/59, pulse 88, temperature 98.1 ?F (36.7 ?C), resp. rate 18, height '4\' 11"'$  (1.499 m), weight 81.1 kg, SpO2 94 %. ?sepsis resolved with treatment. Continue Abx. ?

## 2021-11-18 NOTE — Assessment & Plan Note (Signed)
?    Latest Ref Rng & Units 11/18/2021  ?  5:34 AM 11/17/2021  ?  6:56 PM 11/17/2021  ? 12:09 PM  ?CBC  ?WBC 4.0 - 10.5 K/uL 4.6    4.7    ?Hemoglobin 12.0 - 15.0 g/dL 9.1   9.1   7.7    ?Hematocrit 36.0 - 46.0 % 28.7   28.7   24.6    ?Platelets 150 - 400 K/uL 175    177    ?  ?Continue iron sulfate ? ?

## 2021-11-18 NOTE — Consult Note (Signed)
[place] ?CONSULT NOTE ? ?Patient Care Team: ?[facility], [facility] as PCP - General (General Practice) ?Clent Jacks, RN as Oncology Nurse Navigator ?Earlie Server, MD as Consulting Physician (Hematology and Oncology) ? ?CHIEF COMPLAINTS/PURPOSE OF CONSULTATION: PE; hematuria/history of endometrial cancer ? ?HISTORY OF PRESENTING ILLNESS:  ?Courtney Mack 66 y.o.  female with past medical history of recurrent DVT/PE; s/p IVC filter placement; history of stage II endometrial cancer s/p radiation history of CHF recurrent UTI is currently admitted to the hospital for close hematuria. ? ?Of note patient has had multiple admission to hospital the last many months. ? ?At this admission- patient had a CT scan chest and pelvis-which showed small clot burden/pulmonary emboli in the right posterior pulmonary artery; ultrasound Dopplers showed acute on chronic DVT.  Of note patient had not been taking her Xarelto given hematuria prior to admission for approximately 1 month.  Hematuria was attributed to her ureteral stents. ? ?On this admission patient has been evaluated by urology; s/p stent exchange.  Patient noted to have radiation cystitis-as a cause of hematuria. ? ?Review of Systems  ?Constitutional:  Positive for malaise/fatigue. Negative for chills, diaphoresis, fever and weight loss.  ?HENT:  Negative for nosebleeds and sore throat.   ?Eyes:  Negative for double vision.  ?Respiratory:  Negative for cough, hemoptysis, sputum production and wheezing.   ?Cardiovascular:  Negative for chest pain, palpitations, orthopnea and leg swelling.  ?Gastrointestinal:  Negative for abdominal pain, blood in stool, constipation, diarrhea, heartburn, melena, nausea and vomiting.  ?Genitourinary:  Positive for hematuria. Negative for dysuria, frequency and urgency.  ?Musculoskeletal:  Positive for back pain and joint pain.  ?Skin: Negative.  Negative for itching and rash.  ?Neurological:  Negative  for dizziness, tingling, focal weakness, weakness and headaches.  ?Endo/Heme/Allergies:  Does not bruise/bleed easily.  ?Psychiatric/Behavioral:  Negative for depression. The patient is not nervous/anxious and does not have insomnia.    ? ?MEDICAL HISTORY:  ?Past Medical History:  ?Diagnosis Date  ? Acute bilateral deep vein thrombosis (DVT) of femoral veins ([place]) 01/02/2021  ? Acute massive pulmonary embolism ([place]) 08/25/2020  ? Last Assessment & Plan:  Formatting of this note might be different from the original. Post vena caval filter and on xarelto and O2  ? ARF (acute respiratory failure) ([place])   ? Cancer [facility])   ? endometrial  ? Cellulitis of left lower leg 06/20/2021  ? CHF (congestive heart failure) ([place])   ? COVID-19   ? Diabetes mellitus without complication ([place])   ? DVT (deep venous thrombosis) ([place]) 09/06/2020  ? High cholesterol   ? Hx of blood clots   ? Hyperlipidemia   ? Hypertension   ? IDA (iron deficiency anemia) 09/21/2020  ? Obesity   ? Pressure injury of skin 06/21/2021  ? Pulmonary embolism ([place])   ? Sepsis ([place])   ? Thrombocytopenia ([place])   ? Urinary tract infection 09/13/2021  ? ? ?SURGICAL HISTORY: ?Past Surgical History:  ?Procedure Laterality Date  ? COLONOSCOPY N/A 06/21/2021  ? Procedure: COLONOSCOPY;  Surgeon: Toledo, Benay Pike, MD;  Location: [facility] ENDOSCOPY;  Service: Gastroenterology;  Laterality: N/A;  ? CYSTOSCOPY W/ URETERAL STENT PLACEMENT Left 09/11/2021  ? Procedure: CYSTOSCOPY WITH RETROGRADE PYELOGRAM/URETERAL STENT PLACEMENT;  Surgeon: Abbie Sons, MD;  Location: [facility] ORS;  Service: Urology;  Laterality: Left;  ? CYSTOSCOPY WITH STENT PLACEMENT Left 10/17/2021  ? Procedure: CYSTOSCOPY WITH STENT PLACEMENT;  Surgeon: Abbie Sons, MD;  Location: [facility] ORS;  Service: Urology;  Laterality: Left;  ? CYSTOSCOPY WITH URETEROSCOPY, STONE BASKETRY AND STENT PLACEMENT Left 10/16/2021  ? Procedure: CYSTOSCOPY WITH URETEROSCOPY  AND STENT REMOVAL;  Surgeon: Abbie Sons, MD;   Location: [facility] ORS;  Service: Urology;  Laterality: Left;  ? ENTEROSCOPY N/A 08/17/2021  ? Procedure: ENTEROSCOPY;  Surgeon: Lin Landsman, MD;  Location: [facility] ENDOSCOPY;  Service: Gastroenterology;  Laterality: N/A;  ? ESOPHAGOGASTRODUODENOSCOPY N/A 06/21/2021  ? Procedure: ESOPHAGOGASTRODUODENOSCOPY (EGD);  Surgeon: Toledo, Benay Pike, MD;  Location: [facility] ENDOSCOPY;  Service: Gastroenterology;  Laterality: N/A;  ? ESOPHAGOGASTRODUODENOSCOPY (EGD) WITH PROPOFOL N/A 08/17/2021  ? Procedure: ESOPHAGOGASTRODUODENOSCOPY (EGD) WITH PROPOFOL;  Surgeon: Lin Landsman, MD;  Location[facility] ENDOSCOPY;  Service: Gastroenterology;  Laterality: N/A;  ? ESOPHAGOGASTRODUODENOSCOPY (EGD) WITH PROPOFOL N/A 10/14/2021  ? Procedure: ESOPHAGOGASTRODUODENOSCOPY (EGD) WITH PROPOFOL;  Surgeon: [name], MD;  Location: [facility] ENDOSCOPY;  Service: Endoscopy;  Laterality: N/A;  ? GIVENS CAPSULE STUDY N/A 06/22/2021  ? Procedure: GIVENS CAPSULE STUDY;  Surgeon: Toledo, Benay Pike, MD;  Location: [facility] ENDOSCOPY;  Service: Gastroenterology;  Laterality: N/A;  ? IVC FILTER INSERTION N/A 08/18/2020  ? Procedure: IVC FILTER INSERTION;  Surgeon: Katha Cabal, MD;  Location: [facility] CV LAB;  Service: Cardiovascular;  Laterality: N/A;  ? PERIPHERAL VASCULAR THROMBECTOMY Bilateral 01/03/2021  ? Procedure: PERIPHERAL VASCULAR THROMBECTOMY;  Surgeon: Algernon Huxley, MD;  Location: [place] CV LAB;  Service: Cardiovascular;  Laterality: Bilateral;  ? PULMONARY THROMBECTOMY N/A 08/18/2020  ? Procedure: PULMONARY THROMBECTOMY;  Surgeon: Katha Cabal, MD;  Location: [place] CV LAB;  Service: Cardiovascular;  Laterality: N/A;  ? TUBAL LIGATION    ? VISCERAL ANGIOGRAPHY N/A 07/18/2021  ? Procedure: VISCERAL ANGIOGRAPHY;  Surgeon: Algernon Huxley, MD;  Location: [place] CV LAB;  Service: Cardiovascular;  Laterality: N/A;  ? ? ?SOCIAL HISTORY: ?Social History  ? ?Socioeconomic History  ? Marital status: Single  ?  Spouse name:  Not on file  ? Number of children: Not on file  ? Years of education: Not on file  ? Highest education level: Not on file  ?Occupational History  ? Not on file  ?Tobacco Use  ? Smoking status: Never  ? Smokeless tobacco: Never  ? Tobacco comments:  ?  smoked 2-4 cigarettes for about 2-3 weeks   ?Vaping Use  ? Vaping Use: Never used  ?Substance and Sexual Activity  ? Alcohol use: Not Currently  ? Drug use: Not Currently  ? Sexual activity: Not on file  ?Other Topics Concern  ? Not on file  ?Social History Narrative  ? Not on file  ? ?Social Determinants of Health  ? ?Financial Resource Strain: Not on file  ?Food Insecurity: Not on file  ?Transportation Needs: Not on file  ?Physical Activity: Not on file  ?Stress: Not on file  ?Social Connections: Not on file  ?Intimate Partner Violence: Not on file  ? ? ?FAMILY HISTORY: ?Family History  ?Problem Relation Age of Onset  ? Hypertension Mother   ? Diabetes Mother   ? Diabetes Father   ? Hypertension Father   ? High Cholesterol Father   ? Congestive Heart Failure Father   ? Breast cancer Cousin   ? ? ?ALLERGIES:  has No Known Allergies. ? ?MEDICATIONS:  ?Current Facility-Administered Medications  ?Medication Dose Route Frequency Provider Last Rate Last Admin  ? 0.9 %  sodium chloride infusion  20 mL/hr Intravenous Continuous Irine Seal, MD   Stopped at 11/17/21 0805  ? acetaminophen (TYLENOL)  tablet 650 mg  650 mg Oral Q6H PRN Irine Seal, MD      ? cefTRIAXone (ROCEPHIN) 1 g in sodium chloride 0.9 % 100 mL IVPB  1 g Intravenous Q24H Irine Seal, MD   Stopped at 11/18/21 [phone]  ? Chlorhexidine Gluconate Cloth 2 % PADS 6 each  6 each Topical Daily Irine Seal, MD   6 each at 11/18/21 0908  ? insulin aspart (novoLOG) injection 0-15 Units  0-15 Units Subcutaneous TID WC Irine Seal, MD   3 Units at 11/18/21 1231  ? mometasone-formoterol (DULERA) 100-5 MCG/ACT inhaler 2 puff  2 puff Inhalation BID Irine Seal, MD   2 puff at 11/18/21 0907  ? ondansetron (ZOFRAN) tablet 4 mg   4 mg Oral Q6H PRN Irine Seal, MD      ? Or  ? ondansetron (ZOFRAN) injection 4 mg  4 mg Intravenous Q6H PRN Irine Seal, MD      ? pantoprazole (PROTONIX) EC tablet 40 mg  40 mg Oral Daily Irine Seal,

## 2021-11-18 NOTE — Assessment & Plan Note (Signed)
continue rocephin for now.  Urine and blood cultures are negative thus far ?

## 2021-11-18 NOTE — Assessment & Plan Note (Signed)
Well compensated at this time. Monitor fluids balance ?Net IO Since Admission: 104.94 mL [11/18/21 1031] ? ?

## 2021-11-18 NOTE — Consult Note (Signed)
ANTICOAGULATION CONSULT NOTE  ? ?Pharmacy Consult for Heparin ?Indication: pulmonary embolus ? ?No Known Allergies ? ?Patient Measurements: ?Height: '4\' 11"'$  (149.9 cm) ?Weight: 81.1 kg (178 lb 12.7 oz) ?IBW/kg (Calculated) : 43.2 ?Heparin Dosing Weight: 62.3 kg ? ?Vital Signs: ?Temp: 98.1 ?F (36.7 ?C) (05/14 0801) ?BP: 103/59 (05/14 0801) ?Pulse Rate: 88 (05/14 0801) ? ?Labs: ?Recent Labs  ?  11/16/21 ?1827 11/17/21 ?0109 11/17/21 ?1540 11/17/21 ?0867 11/17/21 ?0950 11/17/21 ?1209 11/17/21 ?1600 11/17/21 ?1856 11/18/21 ?6195  ?HGB 9.7* 9.1*  --   --  8.4* 7.7*  --  9.1* 9.1*  ?HCT 31.7* 29.4*  --   --  27.1* 24.6*  --  28.7* 28.7*  ?PLT 208 198  --   --   --  177  --   --  175  ?APTT  --   --  171*  --  98*  --   --   --   --   ?LABPROT  --   --  15.6*  --   --   --   --   --   --   ?INR  --   --  1.3*  --   --   --   --   --   --   ?HEPARINUNFRC  --   --  0.47   < > 0.45  --  0.36  --  <0.10*  ?CREATININE 0.94 0.87  --   --   --   --   --   --  0.74  ? < > = values in this interval not displayed.  ? ? ? ?Estimated Creatinine Clearance: 63.8 mL/min (by C-G formula based on SCr of 0.74 mg/dL). ? ? ?Medical History: ?Past Medical History:  ?Diagnosis Date  ? Acute bilateral deep vein thrombosis (DVT) of femoral veins ([place]) 01/02/2021  ? Acute massive pulmonary embolism ([place]) 08/25/2020  ? Last Assessment & Plan:  Formatting of this note might be different from the original. Post vena caval filter and on xarelto and O2  ? ARF (acute respiratory failure) ([place])   ? Cancer [facility])   ? endometrial  ? Cellulitis of left lower leg 06/20/2021  ? CHF (congestive heart failure) ([place])   ? COVID-19   ? Diabetes mellitus without complication ([place])   ? DVT (deep venous thrombosis) ([place]) 09/06/2020  ? High cholesterol   ? Hx of blood clots   ? Hyperlipidemia   ? Hypertension   ? IDA (iron deficiency anemia) 09/21/2020  ? Obesity   ? Pressure injury of skin 06/21/2021  ? Pulmonary embolism ([place])   ? Sepsis ([place])   ? Thrombocytopenia  ([place])   ? Urinary tract infection 09/13/2021  ? ? ?Medications:  ?Medications Prior to Admission  ?Medication Sig Dispense Refill Last Dose  ? acetaminophen (TYLENOL) 500 MG tablet Take 500 mg by mouth every 6 (six) hours as needed for mild pain or fever.   prn at prn  ? Ascorbic Acid (VITAMIN C) 1000 MG tablet Take 1,000 mg by mouth daily.   11/16/2021 at 0900  ? BREO ELLIPTA 100-25 MCG/ACT AEPB Inhale 1 puff into the lungs daily.   11/16/2021 at 1700  ? Ferrous Sulfate (IRON) 325 (65 Fe) MG TABS Take 325 mg by mouth every other day.   11/15/2021 at 0900  ? furosemide (LASIX) 20 MG tablet Take 1 tablet (20 mg total) by mouth daily. (Patient taking differently: Take 20 mg by mouth every morning.) 30 tablet 11 11/16/2021 at 0900  ?  loperamide (IMODIUM) 2 MG capsule Take 1 capsule (2 mg total) by mouth as needed for diarrhea or loose stools. 30 capsule 0 prn at prn  ? mometasone-formoterol (DULERA) 100-5 MCG/ACT AERO Inhale 2 puffs into the lungs 2 (two) times daily.   11/16/2021 at 1700  ? Multiple Vitamin (MULTIVITAMIN WITH MINERALS) TABS tablet Take 1 tablet by mouth daily.   11/16/2021 at 0900  ? pantoprazole (PROTONIX) 40 MG tablet Take 40 mg by mouth every morning.   11/16/2021 at 0900  ? rosuvastatin (CRESTOR) 10 MG tablet Take 1 tablet (10 mg total) by mouth daily. (Patient taking differently: Take 10 mg by mouth every morning.) 15 tablet 0 11/16/2021 at 0900  ? nystatin (MYCOSTATIN) 100000 UNIT/ML suspension Take 5 mLs (500,000 Units total) by mouth 3 (three) times daily. For 7-8 days (Patient not taking: Reported on 11/16/2021) 60 mL 0 Completed Course  ? XARELTO 20 MG TABS tablet Take 20 mg by mouth daily. (Patient not taking: Reported on 11/16/2021)   Not Taking  ? ?Scheduled:  ? Chlorhexidine Gluconate Cloth  6 each Topical Daily  ? insulin aspart  0-15 Units Subcutaneous TID WC  ? mometasone-formoterol  2 puff Inhalation BID  ? pantoprazole  40 mg Oral Daily  ? rosuvastatin  10 mg Oral Daily  ? sodium chloride  flush  3 mL Intravenous Q12H  ? ?Infusions:  ? sodium chloride Stopped (11/17/21 0805)  ? cefTRIAXone (ROCEPHIN)  IV Stopped (11/18/21 0450)  ? heparin 1,000 Units/hr (11/18/21 9935)  ? ?PRN: acetaminophen **OR** [DISCONTINUED] acetaminophen, HYDROcodone-acetaminophen, ondansetron **OR** ondansetron (ZOFRAN) IV ?Anti-infectives (From admission, onward)  ? ? Start     Dose/Rate Route Frequency Ordered Stop  ? 11/17/21 2200  cefTRIAXone (ROCEPHIN) 1 g in sodium chloride 0.9 % 100 mL IVPB       ? 1 g ?200 mL/hr over 30 Minutes Intravenous Every 24 hours 11/17/21 0512 11/21/21 2159  ? 11/16/21 2215  cefTRIAXone (ROCEPHIN) 2 g in sodium chloride 0.9 % 100 mL IVPB       ? 2 g ?200 mL/hr over 30 Minutes Intravenous  Once 11/16/21 2210 11/16/21 2306  ? ?  ? ? ?Assessment: ?Pharmacy consulted to start heparin for PE. Pt was on rivaroxaban, but per med rec states not taking. Pt reported having bleeding from the vagina and has a hx of GI bleeding. S/p thrombectomy and IVC filter placement in 2022. Unsure of last dose of rivaroxaban, will order both aPTT and heparin level. If baseline heparin level is falsely elevated use aPTT for monitoring.  ? ?5/13 0950 aPTT 98/HL 0.45  ?5/13 1600 HL 0.36 ?5/14 0533 HL <0.10- Heparin drip held 6 hrs post ureteral stent-heparin drip restarted at 0454, lab at 0534.  Will order HL 6 hrs post restart ? ?Goal of Therapy:  ?Heparin level 0.3-0.7 units/ml ?Monitor platelets by anticoagulation protocol: Yes ?  ?Plan:  ?5/14 0533 HL <0.10- Heparin drip held 6 hrs post ureteral stent-heparin drip restarted at 0454, lab at 0534.  Will order HL 6 hrs post restart ?Will continue current rate of 1000 units/hr. ? CBC per protocol ? ?Noralee Space, PharmD ?11/18/2021,8:05 AM ? ? ?

## 2021-11-18 NOTE — Assessment & Plan Note (Signed)
SSI while in the hospital ? ?

## 2021-11-18 NOTE — Progress Notes (Signed)
?Progress Note ? ? ?Patient: Courtney Mack:[id number] DOB: 01-Aug-1955 DOA: 11/16/2021     2 ?DOS: the patient was seen and examined on 11/18/2021 ?  ?Brief hospital course: ?66 y.o. female with medical history significant of recurrent DVT and massive PE and February 2022 and June 2022 on Xarelto status post thrombectomy and IVC filter placement, history of GI bleed in December 2022 and history of endometrial cancer, heart failure, COVID, iron deficiency anemia, recurrent UTIs admitted for recurrent gross hematuria and anemia being on xarelto which is held and is on heparin drip for now. CT chest showed small PE on right ? ?5/13: left ureteral obstruction seen on CT per Urology, planning stent exchange later tonight. 1 PRBC transfusion for Hb 7.7 Transfer to floor ?5/14: S/p cystoscopy with removal and replacement of left double-J stent.  Fulguration of bladder neck bleeding by urology last night.  Oncology seen and recommended Xarelto 10 mg for anticoagulation considering bleeding risk ? ? ?Assessment and Plan: ?* Gross hematuria ?Status post stent exchange and fulguration of several areas of bleeding at the bladder neck which were bleeding from radiation cystitis.  Per urology she might benefit from hyperbaric oxygen therapy if the bleeding persists.  The ureteral obstruction could be related to that as well with the bladder wall thickening noted on CT.    ? ? ?Hydronephrosis ?Status post stent exchange last evening by urology ? ?UTI (urinary tract infection) ?continue rocephin for now.  Urine and blood cultures are negative thus far ? ?Sepsis ([place]) ?Blood pressure (!) 103/59, pulse 88, temperature 98.1 ?F (36.7 ?C), resp. rate 18, height '4\' 11"'$  (1.499 m), weight 81.1 kg, SpO2 94 %. ?sepsis resolved with treatment. Continue Abx. ? ?Abdominal pain ?Improved now status post stent exchange last night ? ?Thrombocytopenia (HCC)-resolved as of 11/17/2021 ?CBC ?   ?Component Value Date/Time  ? WBC 6.0 11/16/2021 1827   ? RBC 3.74 (L) 11/16/2021 1827  ? HGB 9.7 (L) 11/16/2021 1827  ? HCT 31.7 (L) 11/16/2021 1827  ? PLT 208 11/16/2021 1827  ? MCV 84.8 11/16/2021 1827  ? MCH 25.9 (L) 11/16/2021 1827  ? MCHC 30.6 11/16/2021 1827  ? RDW 22.8 (H) 11/16/2021 1827  ? LYMPHSABS 1.2 11/16/2021 1827  ? MONOABS 0.4 11/16/2021 1827  ? EOSABS 0.3 11/16/2021 1827  ? BASOSABS 0.0 11/16/2021 1827  ?h/o thrombocytopenia stable currently.  ? ? ? ?Hypertension ?monitor ? ?Diabetes mellitus without complication ([place]) ?SSI while in the hospital ? ? ?Endometrial cancer ([place]) ?Follow-up with Dr. Tasia Catchings as an outpatient ? ?IDA (iron deficiency anemia) ? ?  Latest Ref Rng & Units 11/18/2021  ?  5:34 AM 11/17/2021  ?  6:56 PM 11/17/2021  ? 12:09 PM  ?CBC  ?WBC 4.0 - 10.5 K/uL 4.6    4.7    ?Hemoglobin 12.0 - 15.0 g/dL 9.1   9.1   7.7    ?Hematocrit 36.0 - 46.0 % 28.7   28.7   24.6    ?Platelets 150 - 400 K/uL 175    177    ?  ?Continue iron sulfate ? ? ?CHF (congestive heart failure) ([place]) ?Well compensated at this time. Monitor fluids balance ?Net IO Since Admission: 104.94 mL [11/18/21 1031] ? ? ?Hx of blood clots ?CT chest showed small PE in the posterior right lower lobe. On heparin drip for now.  Discussed with oncology Dr. Rogue Bussing recommends trying Xarelto 10 mg.  Considering bleeding and clotting risk, this may be best viable option for now ? ? ?  Gastric antral vascular ectasia ?Continue PPI.  ? ? ? ? ? ?  ?Subjective: Feeling much better.  Hematuria has slowed down since last night ? ?Physical Exam: ?Vitals:  ? 11/18/21 0143 11/18/21 0500 11/18/21 1840 11/18/21 0801  ?BP: (!) 92/47  (!) 106/56 (!) 103/59  ?Pulse: 92  92 88  ?Resp: '16  16 18  '$ ?Temp: 97.7 ?F (36.5 ?C)  98 ?F (36.7 ?C) 98.1 ?F (36.7 ?C)  ?TempSrc:      ?SpO2: 96%  93% 94%  ?Weight:  81.1 kg    ?Height:      ? ?GENERAL:?65 y f lying in the bed comfortable. ?HEAD: Normocephalic, atraumatic.  ?EYES: Pupils equal, round, reactive to light. ?No scleral icterus.  ?MOUTH: Moist mucosal  membrane. ?NECK: Supple. No thyromegaly. No nodules. No JVD.  ?PULMONARY: CTA b/l ?CARDIOVASCULAR: Regular rate and rhythm ?GASTROINTESTINAL: Soft, benign ?NEUROLOGIC: alert and awake, non-focal ?SKIN:intact,warm,dry ? ?Data Reviewed: ? ?Hemoglobin 9.1 ? ?Family Communication: None ? ?Disposition: ?Status is: Inpatient ?Remains inpatient appropriate because: Monitoring of blood counts and for hematuria ? ? Planned Discharge Destination: Home with Home Health ? ? ? DVT prophylaxis Xarelto ?Time spent: 35 minutes ? ?Author: ?Max Sane, MD ?11/18/2021 10:31 AM ? ?For on call review [email or web address].  ?

## 2021-11-18 NOTE — Assessment & Plan Note (Signed)
Improved now status post stent exchange last night ?

## 2021-11-18 NOTE — Anesthesia Postprocedure Evaluation (Signed)
Anesthesia Post Note ? ?Patient: Courtney Mack ? ?Procedure(s) Performed: CYSTOSCOPY WITH STENT REPLACEMENT (Left) ?[place] ? ?Patient location during evaluation: PACU ?Anesthesia Type: MAC ?Level of consciousness: awake and alert ?Pain management: pain level controlled ?Vital Signs Assessment: post-procedure vital signs reviewed and stable ?Respiratory status: spontaneous breathing, nonlabored ventilation and respiratory function stable ?Cardiovascular status: blood pressure returned to baseline and stable ?Postop Assessment: no apparent nausea or vomiting ?Anesthetic complications: no ? ? ?No notable events documented. ? ? ?Last Vitals:  ?Vitals:  ? 11/18/21 0801 11/18/21 1153  ?BP: (!) 103/59 (!) 112/59  ?Pulse: 88 90  ?Resp: 18 18  ?Temp: 36.7 ?C 36.9 ?C  ?SpO2: 94% 94%  ?  ?Last Pain:  ?Vitals:  ? 11/18/21 0908  ?TempSrc:   ?PainSc: 0-No pain  ? ? ?  ?  ?  ?  ?  ?  ? ?[place]uch ? ? ? ? ?

## 2021-11-18 NOTE — Assessment & Plan Note (Signed)
Status post stent exchange and fulguration of several areas of bleeding at the bladder neck which were bleeding from radiation cystitis.  Per urology she might benefit from hyperbaric oxygen therapy if the bleeding persists.  The ureteral obstruction could be related to that as well with the bladder wall thickening noted on CT.    ? ?

## 2021-11-18 NOTE — TOC Initial Note (Signed)
Transition of Care (TOC) - Initial/Assessment Note  ? ? ?Patient Details  ?Name: Courtney Mack ?MRN: [id number] ?Date of Birth: Nov 15, 1955 ? ?Transition of Care (TOC) CM/SW Contact:    ?Smayan Hackbart E Shivani Barrantes, LCSW ?Phone Number: ?11/18/2021, 9:05 AM ? ?Clinical Narrative:                This patient is known to [facility] for recent admissions. ?Patient is from home with daughter and granddaughter.  ?PCP is Princella Ion.  ?Patient has a RW, wheelchair, lift, and bedside commode at home.  ?Confirmed with Corene Cornea with [facility], patient is active with them for PT. ?TOC to follow for additional needs.    ? ? ?Expected Discharge Plan: [place] ?Barriers to Discharge: Continued Medical Work up ? ? ?Patient Goals and CMS Choice ?  ?  ?  ? ?Expected Discharge Plan and Services ?Expected Discharge Plan: [place] ?  ?  ?  ?  ?                ?  ?  ?  ?  ?  ?  ?  ?  ?  ?  ? ?Prior Living Arrangements/Services ?  ?Lives with:: Relatives, Adult Children ?  ?       ?  ?  ?Current home services: DME, Home PT ?  ? ?Activities of Daily Living ?  ?  ? ?Permission Sought/Granted ?  ?  ?   ?   ?   ?   ? ?Emotional Assessment ?  ?  ?  ?  ?Alcohol / Substance Use: Not Applicable ?Psych Involvement: No (comment) ? ?Admission diagnosis:  Gross hematuria [R31.0] ?Acute cystitis with hematuria [N30.01] ?Patient Active Problem List  ? Diagnosis Date Noted  ? Chronic radiation cystitis 11/17/2021  ? CHF (congestive heart failure) ([place]) 11/16/2021  ? Gross hematuria 11/16/2021  ? Abdominal pain 11/16/2021  ? Hydronephrosis   ? Sepsis with hypotension ([place]) 10/17/2021  ? Gastric antral vascular ectasia   ? Anemia due to GI blood loss, symptomatic with fatigue/lightheadedness and shortness of breath 10/12/2021  ? UTI (urinary tract infection) 09/13/2021  ? Anemia 09/13/2021  ? Acute on chronic blood loss anemia 07/18/2021  ? Sepsis ([place]) 06/20/2021  ? HLD (hyperlipidemia) 06/20/2021  ? Chronic  diastolic CHF (congestive heart failure) ([place]) 06/20/2021  ? Risk factors for obstructive sleep apnea 11/10/2020  ? Uterine cancer ([place]) 11/08/2020  ? IDA (iron deficiency anemia) 09/21/2020  ? Endometrial cancer ([place]) 09/13/2020  ? Acute blood loss anemia 09/06/2020  ? Vaginal bleeding 09/06/2020  ? Hx of blood clots 09/06/2020  ? Anemia, unspecified 08/24/2020  ? Morbidly obese ([place]) 08/23/2020  ? Hypertension 08/23/2020  ? Diabetes mellitus without complication ([place]) 88/50/2774  ? Dyslipidemia 08/23/2020  ? Vaginal bleeding, abnormal 08/18/2020  ? Morbid obesity ([place]) 08/18/2020  ? ?PCP:  [facility] [place] ?Pharmacy:   ?[id number] [address], [address] AT [address] ?[place] [address] ?Phone: [phone] Fax: [phone] ? ? ? ? ?Social Determinants of Health (SDOH) Interventions ?  ? ?Readmission Risk Interventions ? ?  11/18/2021  ?  9:04 AM 10/18/2021  ? 11:23 AM 10/13/2021  ? 11:34 AM  ?Readmission Risk Prevention Plan  ?Transportation Screening Complete Complete Complete  ?Medication Review Press photographer) Complete Complete Complete  ?PCP or Specialist appointment within 3-5 days  of discharge Complete Complete Complete  ?[place] or Home Care Consult Complete Complete Complete  ?SW Recovery Care/Counseling Consult Complete Complete Complete  ?Palliative Care Screening Complete Complete Complete  ?Skilled Nursing Facility Complete Complete Complete  ? ? ? ?

## 2021-11-19 ENCOUNTER — Telehealth: Payer: Self-pay

## 2021-11-19 ENCOUNTER — Encounter: Payer: Self-pay | Admitting: Urology

## 2021-11-19 DIAGNOSIS — E119 Type 2 diabetes mellitus without complications: Secondary | ICD-10-CM

## 2021-11-19 DIAGNOSIS — N3041 Irradiation cystitis with hematuria: Secondary | ICD-10-CM

## 2021-11-19 DIAGNOSIS — N3040 Irradiation cystitis without hematuria: Secondary | ICD-10-CM

## 2021-11-19 DIAGNOSIS — D509 Iron deficiency anemia, unspecified: Secondary | ICD-10-CM

## 2021-11-19 DIAGNOSIS — I110 Hypertensive heart disease with heart failure: Secondary | ICD-10-CM

## 2021-11-19 DIAGNOSIS — R109 Unspecified abdominal pain: Secondary | ICD-10-CM

## 2021-11-19 DIAGNOSIS — N1330 Unspecified hydronephrosis: Secondary | ICD-10-CM

## 2021-11-19 DIAGNOSIS — I2699 Other pulmonary embolism without acute cor pulmonale: Secondary | ICD-10-CM

## 2021-11-19 DIAGNOSIS — N3289 Other specified disorders of bladder: Secondary | ICD-10-CM

## 2021-11-19 DIAGNOSIS — D500 Iron deficiency anemia secondary to blood loss (chronic): Secondary | ICD-10-CM

## 2021-11-19 DIAGNOSIS — I509 Heart failure, unspecified: Secondary | ICD-10-CM

## 2021-11-19 DIAGNOSIS — N3001 Acute cystitis with hematuria: Secondary | ICD-10-CM

## 2021-11-19 DIAGNOSIS — C541 Malignant neoplasm of endometrium: Secondary | ICD-10-CM

## 2021-11-19 DIAGNOSIS — K31819 Angiodysplasia of stomach and duodenum without bleeding: Secondary | ICD-10-CM

## 2021-11-19 DIAGNOSIS — Z86718 Personal history of other venous thrombosis and embolism: Secondary | ICD-10-CM

## 2021-11-19 DIAGNOSIS — D696 Thrombocytopenia, unspecified: Secondary | ICD-10-CM

## 2021-11-19 LAB — CBC
HCT: 28.8 % — ABNORMAL LOW (ref 36.0–46.0)
Hemoglobin: 9 g/dL — ABNORMAL LOW (ref 12.0–15.0)
MCH: 26.7 pg (ref 26.0–34.0)
MCHC: 31.3 g/dL (ref 30.0–36.0)
MCV: 85.5 fL (ref 80.0–100.0)
Platelets: 189 10*3/uL (ref 150–400)
RBC: 3.37 MIL/uL — ABNORMAL LOW (ref 3.87–5.11)
RDW: 22.6 % — ABNORMAL HIGH (ref 11.5–15.5)
WBC: 4.9 10*3/uL (ref 4.0–10.5)
nRBC: 0 % (ref 0.0–0.2)

## 2021-11-19 LAB — BASIC METABOLIC PANEL
Anion gap: 6 (ref 5–15)
BUN: 21 mg/dL (ref 8–23)
CO2: 26 mmol/L (ref 22–32)
Calcium: 8.6 mg/dL — ABNORMAL LOW (ref 8.9–10.3)
Chloride: 107 mmol/L (ref 98–111)
Creatinine, Ser: 0.81 mg/dL (ref 0.44–1.00)
GFR, Estimated: >60 mL/min (ref >60)
Glucose, Bld: 111 mg/dL — ABNORMAL HIGH (ref 70–99)
Potassium: 4 mmol/L (ref 3.5–5.1)
Sodium: 139 mmol/L (ref 135–145)

## 2021-11-19 LAB — RETICULOCYTES
Immature Retic Fract: 25.5 % — ABNORMAL HIGH (ref 2.3–15.9)
RBC.: 3.37 MIL/uL — ABNORMAL LOW (ref 3.87–5.11)
Retic Count, Absolute: 87.6 10*3/uL (ref 19.0–186.0)
Retic Ct Pct: 2.6 % (ref 0.4–3.1)

## 2021-11-19 LAB — IRON AND TIBC
Iron: 31 ug/dL (ref 28–170)
Saturation Ratios: 12 % (ref 10.4–31.8)
TIBC: 253 ug/dL (ref 250–450)
UIBC: 222 ug/dL

## 2021-11-19 LAB — GLUCOSE, CAPILLARY
Glucose-Capillary: 110 mg/dL — ABNORMAL HIGH (ref 70–99)
Glucose-Capillary: 98 mg/dL (ref 70–99)

## 2021-11-19 LAB — FERRITIN: Ferritin: 14 ng/mL (ref 11–307)

## 2021-11-19 MED ORDER — RIVAROXABAN 10 MG PO TABS: 10.0000 mg | ORAL_TABLET | Freq: Every day | ORAL | 0 refills | Status: DC

## 2021-11-19 NOTE — Telephone Encounter (Signed)
Patient discharged from hospital. Please schedule and inform pt  of appts.  ? ?lab/MD/ venofer in 1 week  ?

## 2021-11-19 NOTE — Progress Notes (Addendum)
Hematology/Oncology Progress note [phone] [phone]     Patient Care Team: [facility] as PCP - General (General Practice) [name], RN as Oncology Nurse Navigator Rickard Patience, MD as Consulting Physician (Hematology and Oncology)   Name of the patient: Courtney Mack  [id number]  1955-09-12  Date of visit: 11/19/21   INTERVAL HISTORY-   Patient is going home today.  Family members at bedside.  Patient denies any shortness of breath, chest pain.  Bilateral lower extremity swelling.   Patient Active Problem List   Diagnosis Date Noted   Chronic radiation cystitis 11/17/2021   CHF (congestive heart failure) (HCC) 11/16/2021   Gross hematuria 11/16/2021   Abdominal pain 11/16/2021   Hydronephrosis    Sepsis with hypotension (HCC) 10/17/2021   Gastric antral vascular ectasia    Anemia due to GI blood loss, symptomatic with fatigue/lightheadedness and shortness of breath 10/12/2021   UTI (urinary tract infection) 09/13/2021   Anemia 09/13/2021   Acute on chronic blood loss anemia 07/18/2021   Sepsis (HCC) 06/20/2021   HLD (hyperlipidemia) 06/20/2021   Chronic diastolic CHF (congestive heart failure) (HCC) 06/20/2021   Risk factors for obstructive sleep apnea 11/10/2020   Uterine cancer (HCC) 11/08/2020   IDA (iron deficiency anemia) 09/21/2020   Endometrial cancer (HCC) 09/13/2020   Acute blood loss anemia 09/06/2020   Vaginal bleeding 09/06/2020   Hx of blood clots 09/06/2020   Anemia, unspecified 08/24/2020   Morbidly obese (HCC) 08/23/2020   Hypertension 08/23/2020   Diabetes mellitus without complication (HCC) 08/23/2020   Dyslipidemia 08/23/2020   Vaginal bleeding, abnormal 08/18/2020   Morbid obesity (HCC) 08/18/2020   Current Facility-Administered Medications:    acetaminophen (TYLENOL) tablet 650 mg, 650 mg, Oral, Q6H PRN **OR** [DISCONTINUED] acetaminophen (TYLENOL) suppository 650 mg, 650 mg,  Rectal, Q6H PRN, Sherryll Burger, Vipul, MD   cefTRIAXone (ROCEPHIN) 1 g in sodium chloride 0.9 % 100 mL IVPB, 1 g, Intravenous, Q24H, Bjorn Pippin, MD, Stopped at 11/18/21 2301   Chlorhexidine Gluconate Cloth 2 % PADS 6 each, 6 each, Topical, Daily, Bjorn Pippin, MD, 6 each at 11/18/21 0908   insulin aspart (novoLOG) injection 0-15 Units, 0-15 Units, Subcutaneous, TID WC, Bjorn Pippin, MD, 3 Units at 11/18/21 1231   mometasone-formoterol (DULERA) 100-5 MCG/ACT inhaler 2 puff, 2 puff, Inhalation, BID, Bjorn Pippin, MD, 2 puff at 11/19/21 0837   ondansetron (ZOFRAN) tablet 4 mg, 4 mg, Oral, Q6H PRN **OR** ondansetron (ZOFRAN) injection 4 mg, 4 mg, Intravenous, Q6H PRN, Bjorn Pippin, MD   pantoprazole (PROTONIX) EC tablet 40 mg, 40 mg, Oral, Daily, Bjorn Pippin, MD, 40 mg at 11/19/21 0836   rivaroxaban (XARELTO) tablet 10 mg, 10 mg, Oral, Daily, Sherryll Burger, Vipul, MD, 10 mg at 11/19/21 0836   rosuvastatin (CRESTOR) tablet 10 mg, 10 mg, Oral, Daily, Bjorn Pippin, MD, 10 mg at 11/19/21 0836   sodium chloride flush (NS) 0.9 % injection 3 mL, 3 mL, Intravenous, Q12H, Bjorn Pippin, MD, 3 mL at 11/19/21 0837   Physical exam:  Vitals:   11/19/21 0009 11/19/21 0500 11/19/21 0529 11/19/21 0840  BP: (!) 107/52  (!) 101/54 (!) 105/50  Pulse: 94  85 81  Resp: 18  18 18   Temp:   97.8 F (36.6 C) 97.9 F (36.6 C)  TempSrc:    Oral  SpO2: 94%  94% 95%  Weight:  186 lb 8.2 oz (84.6 kg)    Height:       Physical Exam Constitutional:  General: She is not in acute distress.    Appearance: She is obese. She is not diaphoretic.     Comments: Patient sits in the chair.  HENT:     Head: Normocephalic and atraumatic.     Nose: Nose normal.     Mouth/Throat:     Pharynx: No oropharyngeal exudate.  Eyes:     General: No scleral icterus.    Pupils: Pupils are equal, round, and reactive to light.  Cardiovascular:     Rate and Rhythm: Normal rate and regular rhythm.     Heart sounds: No murmur heard. Pulmonary:     Effort:  Pulmonary effort is normal. No respiratory distress.     Breath sounds: No rales.  Chest:     Chest wall: No tenderness.  Abdominal:     General: There is no distension.     Palpations: Abdomen is soft.     Tenderness: There is no abdominal tenderness.  Musculoskeletal:        General: Swelling present. Normal range of motion.     Cervical back: Normal range of motion and neck supple.  Skin:    General: Skin is warm and dry.  Neurological:     Mental Status: She is alert and oriented to person, place, and time. Mental status is at baseline.     Cranial Nerves: No cranial nerve deficit.     Motor: No abnormal muscle tone.  Psychiatric:        Mood and Affect: Affect normal.          Latest Ref Rng & Units 11/19/2021    3:38 AM  CMP  Glucose 70 - 99 mg/dL 829    BUN 8 - 23 mg/dL 21    Creatinine 5.62 - 1.00 mg/dL 1.30    Sodium 865 - 784 mmol/L 139    Potassium 3.5 - 5.1 mmol/L 4.0    Chloride 98 - 111 mmol/L 107    CO2 22 - 32 mmol/L 26    Calcium 8.9 - 10.3 mg/dL 8.6        Latest Ref Rng & Units 11/19/2021    3:38 AM  CBC  WBC 4.0 - 10.5 K/uL 4.9    Hemoglobin 12.0 - 15.0 g/dL 9.0    Hematocrit 69.6 - 46.0 % 28.8    Platelets 150 - 400 K/uL 189      RADIOGRAPHIC STUDIES: I have personally reviewed the radiological images as listed and agreed with the findings in the report. CT Angio Chest Pulmonary Embolism (PE) W or WO Contrast  Result Date: 11/17/2021 CLINICAL DATA:  Pulmonary embolism (PE) suspected, high prob EXAM: CT ANGIOGRAPHY CHEST WITH CONTRAST TECHNIQUE: Multidetector CT imaging of the chest was performed using the standard protocol during bolus administration of intravenous contrast. Multiplanar CT image reconstructions and MIPs were obtained to evaluate the vascular anatomy. RADIATION DOSE REDUCTION: This exam was performed according to the departmental dose-optimization program which includes automated exposure control, adjustment of the mA and/or kV  according to patient size and/or use of iterative reconstruction technique. CONTRAST:  OMNIPAQUE IOHEXOL 350 MG/ML SOLN COMPARISON:  09/11/2021 FINDINGS: Cardiovascular: Small filling defects are seen in the posterior right lower lobe pulmonary arterial branch compatible with pulmonary embolus. No additional filling defects. Heart is normal size. No evidence of right heart strain. Aorta normal caliber. Scattered aortic calcifications. Mediastinum/Nodes: No mediastinal, hilar, or axillary adenopathy. Trachea and esophagus are unremarkable. Thyroid unremarkable. Lungs/Pleura: Atelectasis or scarring in the anterior lingula. No  acute confluent airspace opacities or effusions. Upper Abdomen: No acute findings Musculoskeletal: Chest wall soft tissues are unremarkable. No acute bony abnormality. Review of the MIP images confirms the above findings. IMPRESSION: Small pulmonary embolus seen in the posterior right lower lobe pulmonary arterial branch. No evidence of right heart strain. Aortic Atherosclerosis (ICD10-I70.0). Electronically Signed   By: Charlett Nose M.D.   On: 11/17/2021 00:30   CT ABDOMEN PELVIS W CONTRAST  Result Date: 11/17/2021 CLINICAL DATA:  Hydronephrosis UTI, recurrent/complicated (Female) Sepsis UPJ obstruction suspected (Ped 0-17y) Hematuria, gross/macroscopic Nephrolithiasis. Vaginal bleeding. EXAM: CT ABDOMEN AND PELVIS WITH CONTRAST TECHNIQUE: Multidetector CT imaging of the abdomen and pelvis was performed using the standard protocol following bolus administration of intravenous contrast. RADIATION DOSE REDUCTION: This exam was performed according to the departmental dose-optimization program which includes automated exposure control, adjustment of the mA and/or kV according to patient size and/or use of iterative reconstruction technique. CONTRAST:  OMNIPAQUE IOHEXOL 350 MG/ML SOLN COMPARISON:  10/16/2021 FINDINGS: Lower chest: No acute abnormality Hepatobiliary: No focal hepatic  abnormality. Gallbladder unremarkable. Pancreas: No focal abnormality or ductal dilatation. Spleen: No focal abnormality.  Normal size. Adrenals/Urinary Tract: Left ureteral stent in place. There is mild left hydronephrosis, similar to prior study. No hydronephrosis on the right. No renal or adrenal mass. Urinary bladder is decompressed. Bladder wall appears thickened. Stomach/Bowel: Stomach, large and small bowel grossly unremarkable. Vascular/Lymphatic: Aortic atherosclerosis. No evidence of aneurysm or adenopathy. IVC filter in place. Reproductive: Uterus and adnexa unremarkable.  No mass. Other: No free fluid or free air. Musculoskeletal: No acute bony abnormality. Degenerative changes in the lumbar spine. IMPRESSION: Left ureteral stent in place with continued mild left hydronephrosis, stable since prior study. Bladder is decompressed. Bladder wall appears thickened. Recommend clinical correlation to exclude cystitis. Electronically Signed   By: Charlett Nose M.D.   On: 11/17/2021 00:33   US Venous Img Lower Bilateral (DVT)  Result Date: 11/17/2021 CLINICAL DATA:  Pulmonary embolism, lower extremity swelling EXAM: BILATERAL LOWER EXTREMITY VENOUS DOPPLER ULTRASOUND TECHNIQUE: Gray-scale sonography with graded compression, as well as color Doppler and duplex ultrasound were performed to evaluate the lower extremity deep venous systems from the level of the common femoral vein and including the common femoral, femoral, profunda femoral, popliteal and calf veins including the posterior tibial, peroneal and gastrocnemius veins when visible. The superficial great saphenous vein was also interrogated. Spectral Doppler was utilized to evaluate flow at rest and with distal augmentation maneuvers in the common femoral, femoral and popliteal veins. COMPARISON:  None Available. FINDINGS: RIGHT LOWER EXTREMITY Common Femoral Vein: Vessel is partially compressible. Nonocclusive thrombus versus sequelae of prior DVT  present in the common femoral vein. Saphenofemoral Junction: No evidence of thrombus. Normal compressibility and flow on color Doppler imaging. Profunda Femoral Vein: Mild eccentric wall thickening with incomplete compressibility. Femoral Vein: Mild eccentric wall thickening in the femoral vein proximally. Absence of flow in the mid thigh consistent with occlusive thrombus. Popliteal Vein: Partial compressibility.  Color flow is present. Calf Veins: Thrombus present within the posterior tibial veins. The peroneal vein is not well seen. Superficial Great Saphenous Vein: No evidence of thrombus. Normal compressibility. Venous Reflux:  None. Other Findings:  None. LEFT LOWER EXTREMITY Common Femoral Vein: Eccentric wall thickening consistent with nonocclusive thrombus. Saphenofemoral Junction: No evidence of thrombus. Normal compressibility and flow on color Doppler imaging. Profunda Femoral Vein: Eccentric wall thickening. There is persistent vascular patency. Femoral Vein: Patent but incompletely compressible secondary to eccentric wall thickening. Popliteal  Vein: Popliteal vein is patent. Calf Veins: Noncompressibility of the calf vessels consistent with DVT. Superficial Great Saphenous Vein: No evidence of thrombus. Normal compressibility. Venous Reflux:  None. Other Findings:  None. IMPRESSION: 1. Likely a mix of acute and chronic DVT in the bilateral lower extremities. 2. Nonocclusive thrombus present within the bilateral common femoral veins and right superficial femoral and popliteal veins. 3. Occlusive thrombus present in the left mid femoral vein. Nonocclusive thrombus in the left popliteal vein. 4. Probable occlusive thrombus in the bilateral posterior tibial veins in the calves. Electronically Signed   By: Malachy Moan M.D.   On: 11/17/2021 17:29   DG OR UROLOGY CYSTO IMAGE ([facility] ONLY)  Result Date: 11/17/2021 There is no interpretation for this exam.  This order is for images obtained during a  surgical procedure.  Please See "Surgeries" Tab for more information regarding the procedure.    Assessment and plan-   #Acute pulmonary embolism, acute on chronic bilateral lower extremity DVT. + IVC filter High risk of thrombosis as well as risk of bleeding-ongoing hematuria. Patient was seen by my colleague Dr. Donneta Romberg over the weekend.  I agree with him that low-dose Xarelto 10 mg daily, with future plan of titrating up to standard dose once hematuria stops.  Watch for hemoglobin.  #Hematuria due to bladder neck bleeding, possible radiation cystitis.   hyperbaric oxygen therapy as per urology. #UTI, left ureteral stent status post replacement.  #History of endometrial cancer stage II status post radiation.  Currently no evidence of recurrence.  Patient will follow-up outpatient with me. Thank you for allowing me to participate in the care of this patient.   Rickard Patience, MD, PhD Hematology Oncology  11/19/2021

## 2021-11-19 NOTE — Progress Notes (Signed)
Courtney Mack to be D/C'd Home per MD order.  Discussed prescriptions and follow up appointments with the patient. Prescriptions given to patient, medication list explained in detail. Pt verbalized understanding. ? ?Allergies as of 11/19/2021   ?No Known Allergies ?  ? ?  ?Medication List  ?  ? ?STOP taking these medications   ? ?nystatin 100000 UNIT/ML suspension ?Commonly known as: MYCOSTATIN ?  ? ?  ? ?TAKE these medications   ? ?acetaminophen 500 MG tablet ?Commonly known as: TYLENOL ?Take 500 mg by mouth every 6 (six) hours as needed for mild pain or fever. ?  ?Breo Ellipta 100-25 MCG/ACT Aepb ?Generic drug: fluticasone furoate-vilanterol ?Inhale 1 puff into the lungs daily. ?  ?furosemide 20 MG tablet ?Commonly known as: Lasix ?Take 1 tablet (20 mg total) by mouth daily. ?What changed: when to take this ?  ?Iron 325 (65 Fe) MG Tabs ?Take 325 mg by mouth every other day. ?  ?loperamide 2 MG capsule ?Commonly known as: IMODIUM ?Take 1 capsule (2 mg total) by mouth as needed for diarrhea or loose stools. ?  ?mometasone-formoterol 100-5 MCG/ACT Aero ?Commonly known as: DULERA ?Inhale 2 puffs into the lungs 2 (two) times daily. ?  ?multivitamin with minerals Tabs tablet ?Take 1 tablet by mouth daily. ?  ?pantoprazole 40 MG tablet ?Commonly known as: PROTONIX ?Take 40 mg by mouth every morning. ?  ?rivaroxaban 10 MG Tabs tablet ?Commonly known as: XARELTO ?Take 1 tablet (10 mg total) by mouth daily. ?What changed:  ?medication strength ?how much to take ?  ?rosuvastatin 10 MG tablet ?Commonly known as: CRESTOR ?Take 1 tablet (10 mg total) by mouth daily. ?What changed: when to take this ?  ?vitamin C 1000 MG tablet ?Take 1,000 mg by mouth daily. ?  ? ?  ? ? ?Vitals:  ? 11/19/21 0529 11/19/21 0840  ?BP: (!) 101/54 (!) 105/50  ?Pulse: 85 81  ?Resp: 18 18  ?Temp: 97.8 ?F (36.6 ?C) 97.9 ?F (36.6 ?C)  ?SpO2: 94% 95%  ? ? ?Skin clean, dry and intact without evidence of skin break down, no evidence of skin tears noted.  IV catheter discontinued intact. Site without signs and symptoms of complications. Dressing and pressure applied. Pt denies pain at this time. No complaints noted. ? ?An After Visit Summary was printed and given to the patient. ?Patient escorted via [place], and D/C home via private auto. ? ?Rolley Sims  ?

## 2021-11-19 NOTE — Care Management Important Message (Signed)
Important Message ? ?Patient Details  ?Name: Courtney Mack ?MRN: [id number] ?Date of Birth: 1956/04/23 ? ? ?Medicare Important Message Given:  Yes ? ? ? ? ?[name] ?11/19/2021, 11:02 AM ?

## 2021-11-20 LAB — BPAM RBC
Blood Product Expiration Date: 202305302359
Blood Product Expiration Date: 202306042359
Blood Product Expiration Date: 202306072359
ISSUE DATE / TIME: 202305131526
Unit Type and Rh: 6200
Unit Type and Rh: 6200
Unit Type and Rh: 6200

## 2021-11-20 LAB — TYPE AND SCREEN
ABO/RH(D): A POS
Antibody Screen: POSITIVE
Donor AG Type: NEGATIVE
Unit division: 0
Unit division: 0
Unit division: 0

## 2021-11-21 NOTE — Discharge Summary (Incomplete)
?Physician Discharge Summary ?  ?Patient: NYLANI MICHETTI MRN: [id number] DOB: 1955/08/31  ?Admit date:     11/16/2021  ?Discharge date: {dischdate:26783}  ?Discharge Physician: Max Sane  ? ?PCP: [facility] [place]  ? ?Recommendations at discharge:  ?{Tip this will not be part of the note when signed- Example include specific recommendations for outpatient follow-up, pending tests to follow-up on. ?(Optional):26781} ? *** ? ?Discharge Diagnoses: ?Principal Problem: ?  Gross hematuria ?Active Problems: ?  Sepsis ([place]) ?  UTI (urinary tract infection) ?  Hydronephrosis ?  Abdominal pain ?  Hypertension ?  Diabetes mellitus without complication ([place]) ?  Endometrial cancer ([place]) ?  IDA (iron deficiency anemia) ?  CHF (congestive heart failure) ([place]) ?  Hx of blood clots ?  Gastric antral vascular ectasia ?  Chronic radiation cystitis ? ?Resolved Problems: ?  Thrombocytopenia ([place]) ? ?Hospital Course: ?66 y.o. female with medical history significant of recurrent DVT and massive PE and February 2022 and June 2022 on Xarelto status post thrombectomy and IVC filter placement, history of GI bleed in December 2022 and history of endometrial cancer, heart failure, COVID, iron deficiency anemia, recurrent UTIs admitted for recurrent gross hematuria and anemia being on xarelto which is held and is on heparin drip for now. CT chest showed small PE on right ? ?5/13: left ureteral obstruction seen on CT per Urology, planning stent exchange later tonight. 1 PRBC transfusion for Hb 7.7 Transfer to floor ?5/14: S/p cystoscopy with removal and replacement of left double-J stent.  Fulguration of bladder neck bleeding by urology last night.  Oncology seen and recommended Xarelto 10 mg for anticoagulation considering bleeding risk ? ?Assessment and Plan: ?* Gross hematuria ?Status post stent exchange and fulguration of several areas of bleeding at the bladder neck which were bleeding from radiation cystitis.  Per  urology she might benefit from hyperbaric oxygen therapy if the bleeding persists.  The ureteral obstruction could be related to that as well with the bladder wall thickening noted on CT.    ? ? ?Hydronephrosis ?Status post stent exchange last evening by urology ? ?UTI (urinary tract infection) ?continue rocephin for now.  Urine and blood cultures are negative thus far ? ?Sepsis ([place]) ?Blood pressure (!) 103/59, pulse 88, temperature 98.1 ?F (36.7 ?C), resp. rate 18, height '4\' 11"'$  (1.499 m), weight 81.1 kg, SpO2 94 %. ?sepsis resolved with treatment. Continue Abx. ? ?Abdominal pain ?Improved now status post stent exchange last night ? ?Thrombocytopenia (HCC)-resolved as of 11/17/2021 ?CBC ?   ?Component Value Date/Time  ? WBC 6.0 11/16/2021 1827  ? RBC 3.74 (L) 11/16/2021 1827  ? HGB 9.7 (L) 11/16/2021 1827  ? HCT 31.7 (L) 11/16/2021 1827  ? PLT 208 11/16/2021 1827  ? MCV 84.8 11/16/2021 1827  ? MCH 25.9 (L) 11/16/2021 1827  ? MCHC 30.6 11/16/2021 1827  ? RDW 22.8 (H) 11/16/2021 1827  ? LYMPHSABS 1.2 11/16/2021 1827  ? MONOABS 0.4 11/16/2021 1827  ? EOSABS 0.3 11/16/2021 1827  ? BASOSABS 0.0 11/16/2021 1827  ?h/o thrombocytopenia stable currently.  ? ? ? ?Hypertension ?monitor ? ?Diabetes mellitus without complication ([place]) ?SSI while in the hospital ? ? ?Endometrial cancer ([place]) ?Follow-up with Dr. Tasia Catchings as an outpatient ? ?IDA (iron deficiency anemia) ? ?  Latest Ref Rng & Units 11/18/2021  ?  5:34 AM 11/17/2021  ?  6:56 PM 11/17/2021  ? 12:09 PM  ?CBC  ?WBC 4.0 - 10.5 K/uL 4.6    4.7    ?  Hemoglobin 12.0 - 15.0 g/dL 9.1   9.1   7.7    ?Hematocrit 36.0 - 46.0 % 28.7   28.7   24.6    ?Platelets 150 - 400 K/uL 175    177    ?  ?Continue iron sulfate ? ? ?CHF (congestive heart failure) ([place]) ?Well compensated at this time. Monitor fluids balance ?Net IO Since Admission: 104.94 mL [11/18/21 1031] ? ? ?Hx of blood clots ?CT chest showed small PE in the posterior right lower lobe. On heparin drip for now.  Discussed with  oncology Dr. Rogue Bussing recommends trying Xarelto 10 mg.  Considering bleeding and clotting risk, this may be best viable option for now ? ? ?Gastric antral vascular ectasia ?Continue PPI.  ? ? ? ? ? ? {Tip this will not be part of the note when signed Body mass index is 37.67 kg/m?. ?, ,  ?Active Pressure Injury/Wound(s)   ? ? Pressure Ulcer  Duration  ? ?      ? Pressure Injury 06/20/21 Buttocks Posterior;Lower;Right Stage 2 -  Partial thickness loss of dermis presenting as a shallow open injury with a red, pink wound bed without slough. small pink/red shallow opening 153 days  ? ?  ?  ? ?  ? (Optional):26781} ? ?{(NOTE) Pain control PDMP Statment (Optional):26782} ?Consultants: *** ?Procedures performed: ***  ?Disposition: {Plan; Disposition:26390} ?Diet recommendation:  ?Discharge Diet Orders (From admission, onward)  ? ?  Start     Ordered  ? 11/19/21 0000  Diet - low sodium heart healthy       ? 11/19/21 0805  ? ?  ?  ? ?  ? ?{Diet_Plan:26776} ?DISCHARGE MEDICATION: ?Allergies as of 11/19/2021   ?No Known Allergies ?  ? ?  ?Medication List  ?  ? ?STOP taking these medications   ? ?nystatin 100000 UNIT/ML suspension ?Commonly known as: MYCOSTATIN ?  ? ?  ? ?TAKE these medications   ? ?acetaminophen 500 MG tablet ?Commonly known as: TYLENOL ?Take 500 mg by mouth every 6 (six) hours as needed for mild pain or fever. ?  ?Breo Ellipta 100-25 MCG/ACT Aepb ?Generic drug: fluticasone furoate-vilanterol ?Inhale 1 puff into the lungs daily. ?  ?furosemide 20 MG tablet ?Commonly known as: Lasix ?Take 1 tablet (20 mg total) by mouth daily. ?What changed: when to take this ?  ?Iron 325 (65 Fe) MG Tabs ?Take 325 mg by mouth every other day. ?  ?loperamide 2 MG capsule ?Commonly known as: IMODIUM ?Take 1 capsule (2 mg total) by mouth as needed for diarrhea or loose stools. ?  ?mometasone-formoterol 100-5 MCG/ACT Aero ?Commonly known as: DULERA ?Inhale 2 puffs into the lungs 2 (two) times daily. ?  ?multivitamin with minerals  Tabs tablet ?Take 1 tablet by mouth daily. ?  ?pantoprazole 40 MG tablet ?Commonly known as: PROTONIX ?Take 40 mg by mouth every morning. ?  ?rivaroxaban 10 MG Tabs tablet ?Commonly known as: XARELTO ?Take 1 tablet (10 mg total) by mouth daily. ?What changed:  ?medication strength ?how much to take ?  ?rosuvastatin 10 MG tablet ?Commonly known as: CRESTOR ?Take 1 tablet (10 mg total) by mouth daily. ?What changed: when to take this ?  ?vitamin C 1000 MG tablet ?Take 1,000 mg by mouth daily. ?  ? ?  ? ? Follow-up Information   ? ? Abbie Sons, MD. Daphane Shepherd on 12/12/2021.   ?Specialty: Urology ?Why: '@10'$ :15am; at the [facility] ?Contact information: ?[address] ?[address] ?[place]  [place] [address] ?[phone] ? ? ?  ?  ? ? [facility]. Go in 2 day(s).   ?Specialty: General Practice ?Why: Office will call pt. to make follow-up appt. ?Contact information: ?[address] ?[place] [address] ?[phone] ? ? ?  ?  ? ?  ?  ? ?  ? ?Discharge Exam: ?Filed Weights  ? 11/17/21 0334 11/18/21 0500 11/19/21 0500  ?Weight: 81.5 kg 81.1 kg 84.6 kg  ? ?*** ? ?Condition at discharge: {DC Condition:26389} ? ?The results of significant diagnostics from this hospitalization (including imaging, microbiology, ancillary and laboratory) are listed below for reference.  ? ?Imaging Studies: ?CT Angio Chest Pulmonary Embolism (PE) W or WO Contrast ? ?Result Date: 11/17/2021 ?CLINICAL DATA:  Pulmonary embolism (PE) suspected, high prob EXAM: CT ANGIOGRAPHY CHEST WITH CONTRAST TECHNIQUE: Multidetector CT imaging of the chest was performed using the standard protocol during bolus administration of intravenous contrast. Multiplanar CT image reconstructions and MIPs were obtained to evaluate the vascular anatomy. RADIATION DOSE REDUCTION: This exam was performed according to the departmental dose-optimization program which includes automated exposure control, adjustment of the mA and/or kV  according to patient size and/or use of iterative reconstruction technique. CONTRAST:  135m OMNIPAQUE IOHEXOL 350 MG/ML SOLN COMPARISON:  09/11/2021 FINDINGS: Cardiovascular: Small filling defects are seen in t

## 2021-11-22 ENCOUNTER — Ambulatory Visit: Payer: Medicare (Managed Care) | Admitting: Family

## 2021-11-22 NOTE — Discharge Summary (Signed)
Physician Discharge Summary   Patient: Courtney Mack MRN: [id number] DOB: 06-Nov-1955  Admit date:     11/16/2021  Discharge date: 11/19/2021  Discharge Physician: Max Sane   PCP: [facility] [place]   Recommendations at discharge:    F/up with outpt providers as requested  Discharge Diagnoses: Principal Problem:   Gross hematuria Active Problems:   Sepsis ([place])   UTI (urinary tract infection)   Hydronephrosis   Abdominal pain   Hypertension   Diabetes mellitus without complication ([place])   Endometrial cancer ([place])   IDA (iron deficiency anemia)   CHF (congestive heart failure) (HCC)   Hx of blood clots   Gastric antral vascular ectasia   Chronic radiation cystitis  Resolved Problems:   Thrombocytopenia [facility])  Hospital Course: 66 y.o. female with medical history significant of recurrent DVT and massive PE and February 2022 and June 2022 on Xarelto status post thrombectomy and IVC filter placement, history of GI bleed in December 2022 and history of endometrial cancer, heart failure, COVID, iron deficiency anemia, recurrent UTIs admitted for recurrent gross hematuria and anemia being on xarelto which is held and is on heparin drip for now. CT chest showed small PE on right  5/13: left ureteral obstruction seen on CT per Urology, planning stent exchange later tonight. 1 PRBC transfusion for Hb 7.7 Transfer to floor 5/14: S/p cystoscopy with removal and replacement of left double-J stent.  Fulguration of bladder neck bleeding by urology last night.  Oncology seen and recommended Xarelto 10 mg for anticoagulation considering bleeding risk  Assessment and Plan: * Gross hematuria Likely due to radiation cystitis and being on blood thinners Status post stent exchange and fulguration of several areas of bleeding at the bladder neck which were bleeding from radiation cystitis. Hematuria resolved and stable H & H. Outpt Urology f/up  Hydronephrosis Status post  stent exchange by urology  UTI (urinary tract infection) Treated with rocephin while in the hospital.  Urine and blood cultures are negative thus far  Sepsis ([place]) Blood pressure (!) 103/59, pulse 88, temperature 98.1 F (36.7 C), resp. rate 18, height '4\' 11"'$  (1.499 m), weight 81.1 kg, SpO2 94 %. sepsis resolved with treatment.   Abdominal pain Improved now status post stent exchange last night.  Thrombocytopenia (HCC)-resolved as of 11/17/2021 CBC    Component Value Date/Time   WBC 6.0 11/16/2021 1827   RBC 3.74 (L) 11/16/2021 1827   HGB 9.7 (L) 11/16/2021 1827   HCT 31.7 (L) 11/16/2021 1827   PLT 208 11/16/2021 1827   MCV 84.8 11/16/2021 1827   MCH 25.9 (L) 11/16/2021 1827   MCHC 30.6 11/16/2021 1827   RDW 22.8 (H) 11/16/2021 1827   LYMPHSABS 1.2 11/16/2021 1827   MONOABS 0.4 11/16/2021 1827   EOSABS 0.3 11/16/2021 1827   BASOSABS 0.0 11/16/2021 1827  Outpt onco f/up  Hypertension Diabetes mellitus without complication ([place]) Endometrial cancer ([place]) Follow-up with Dr. Tasia Catchings as an outpatient  IDA (iron deficiency anemia)    Latest Ref Rng & Units 11/18/2021    5:34 AM 11/17/2021    6:56 PM 11/17/2021   12:09 PM  CBC  WBC 4.0 - 10.5 K/uL 4.6    4.7    Hemoglobin 12.0 - 15.0 g/dL 9.1   9.1   7.7    Hematocrit 36.0 - 46.0 % 28.7   28.7   24.6    Platelets 150 - 400 K/uL 175    177      Continue iron  sulfate   CHF (congestive heart failure) (HCC) Well compensated at this time.   Hx of blood clots CT chest showed small PE in the posterior right lower lobe. Oncology Dr. Rogue Bussing recommends trying Xarelto 10 mg. Considering bleeding and clotting risk, this may be best viable option for now. Patient understands the risks and benefits and agreeable with this.  Gastric antral vascular ectasia Continue PPI.           Consultants: Urology, Oncology Procedures performed: Cystoscopy on 5/13  Disposition: Home Diet recommendation:  Discharge Diet Orders (From  admission, onward)     Start     Ordered   11/19/21 0000  Diet - low sodium heart healthy        11/19/21 0805           Carb modified diet DISCHARGE MEDICATION: Allergies as of 11/19/2021   No Known Allergies      Medication List     STOP taking these medications    nystatin 100000 UNIT/ML suspension Commonly known as: MYCOSTATIN       TAKE these medications    acetaminophen 500 MG tablet Commonly known as: TYLENOL Take 500 mg by mouth every 6 (six) hours as needed for mild pain or fever.   Breo Ellipta 100-25 MCG/ACT Aepb Generic drug: fluticasone furoate-vilanterol Inhale 1 puff into the lungs daily.   furosemide 20 MG tablet Commonly known as: Lasix Take 1 tablet (20 mg total) by mouth daily. What changed: when to take this   Iron 325 (65 Fe) MG Tabs Take 325 mg by mouth every other day.   loperamide 2 MG capsule Commonly known as: IMODIUM Take 1 capsule (2 mg total) by mouth as needed for diarrhea or loose stools.   mometasone-formoterol 100-5 MCG/ACT Aero Commonly known as: DULERA Inhale 2 puffs into the lungs 2 (two) times daily.   multivitamin with minerals Tabs tablet Take 1 tablet by mouth daily.   pantoprazole 40 MG tablet Commonly known as: PROTONIX Take 40 mg by mouth every morning.   rivaroxaban 10 MG Tabs tablet Commonly known as: XARELTO Take 1 tablet (10 mg total) by mouth daily. What changed:  medication strength how much to take   rosuvastatin 10 MG tablet Commonly known as: CRESTOR Take 1 tablet (10 mg total) by mouth daily. What changed: when to take this   vitamin C 1000 MG tablet Take 1,000 mg by mouth daily.        Follow-up Information     Abbie Sons, MD. Go on 12/12/2021.   Specialty: Urology Why: '@10'$ :15am; at the [place] information: [place] [address] [place], [place]. Go in 2 day(s).   Specialty:  General Practice Why: Office will call pt. to make follow-up appt. Contact information: [place] [address] [phone]                Discharge Exam: Filed Weights   11/17/21 0334 11/18/21 0500 11/19/21 0500  Weight: 81.5 kg 81.1 kg 84.6 kg   GENERAL: 4 y f lying in the bed comfortable. HEAD: Normocephalic, atraumatic.  EYES: Pupils equal, round, reactive to light.  No scleral icterus.  MOUTH: Moist mucosal membrane. NECK: Supple. No thyromegaly. No nodules. No JVD.  PULMONARY: CTA b/l CARDIOVASCULAR: Regular rate and rhythm GASTROINTESTINAL: Soft, benign NEUROLOGIC: alert and awake, non-focal SKIN:intact,warm,dry  Condition at discharge: good  The results of significant diagnostics from this hospitalization (including imaging, microbiology, ancillary and laboratory) are listed below for reference.   Imaging Studies: CT Angio Chest Pulmonary Embolism (PE) W or WO Contrast  Result Date: 11/17/2021 CLINICAL DATA:  Pulmonary embolism (PE) suspected, high prob EXAM: CT ANGIOGRAPHY CHEST WITH CONTRAST TECHNIQUE: Multidetector CT imaging of the chest was performed using the standard protocol during bolus administration of intravenous contrast. Multiplanar CT image reconstructions and MIPs were obtained to evaluate the vascular anatomy. RADIATION DOSE REDUCTION: This exam was performed according to the departmental dose-optimization program which includes automated exposure control, adjustment of the mA and/or kV according to patient size and/or use of iterative reconstruction technique. CONTRAST:  129m OMNIPAQUE IOHEXOL 350 MG/ML SOLN COMPARISON:  09/11/2021 FINDINGS: Cardiovascular: Small filling defects are seen in the posterior right lower lobe pulmonary arterial branch compatible with pulmonary embolus. No additional filling defects. Heart is normal size. No evidence of right heart strain. Aorta normal caliber. Scattered aortic calcifications.  Mediastinum/Nodes: No mediastinal, hilar, or axillary adenopathy. Trachea and esophagus are unremarkable. Thyroid unremarkable. Lungs/Pleura: Atelectasis or scarring in the anterior lingula. No acute confluent airspace opacities or effusions. Upper Abdomen: No acute findings Musculoskeletal: Chest wall soft tissues are unremarkable. No acute bony abnormality. Review of the MIP images confirms the above findings. IMPRESSION: Small pulmonary embolus seen in the posterior right lower lobe pulmonary arterial branch. No evidence of right heart strain. Aortic Atherosclerosis (ICD10-I70.0). Electronically Signed   By: KRolm BaptiseM.D.   On: 11/17/2021 00:30   CT ABDOMEN PELVIS W CONTRAST  Result Date: 11/17/2021 CLINICAL DATA:  Hydronephrosis UTI, recurrent/complicated (Female) Sepsis UPJ obstruction suspected (Ped 0-17y) Hematuria, gross/macroscopic Nephrolithiasis. Vaginal bleeding. EXAM: CT ABDOMEN AND PELVIS WITH CONTRAST TECHNIQUE: Multidetector CT imaging of the abdomen and pelvis was performed using the standard protocol following bolus administration of intravenous contrast. RADIATION DOSE REDUCTION: This exam was performed according to the departmental dose-optimization program which includes automated exposure control, adjustment of the mA and/or kV according to patient size and/or use of iterative reconstruction technique. CONTRAST:  105mOMNIPAQUE IOHEXOL 350 MG/ML SOLN COMPARISON:  10/16/2021 FINDINGS: Lower chest: No acute abnormality Hepatobiliary: No focal hepatic abnormality. Gallbladder unremarkable. Pancreas: No focal abnormality or ductal dilatation. Spleen: No focal abnormality.  Normal size. Adrenals/Urinary Tract: Left ureteral stent in place. There is mild left hydronephrosis, similar to prior study. No hydronephrosis on the right. No renal or adrenal mass. Urinary bladder is decompressed. Bladder wall appears thickened. Stomach/Bowel: Stomach, large and small bowel grossly unremarkable.  Vascular/Lymphatic: Aortic atherosclerosis. No evidence of aneurysm or adenopathy. IVC filter in place. Reproductive: Uterus and adnexa unremarkable.  No mass. Other: No free fluid or free air. Musculoskeletal: No acute bony abnormality. Degenerative changes in the lumbar spine. IMPRESSION: Left ureteral stent in place with continued mild left hydronephrosis, stable since prior study. Bladder is decompressed. Bladder wall appears thickened. Recommend clinical correlation to exclude cystitis. Electronically Signed   By: KeRolm Baptise.D.   On: 11/17/2021 00:33   [place]ous Img Lower Bilateral (DVT)  Result Date: 11/17/2021 CLINICAL DATA:  Pulmonary embolism, lower extremity swelling EXAM: BILATERAL LOWER EXTREMITY VENOUS DOPPLER ULTRASOUND TECHNIQUE: Gray-scale sonography with graded compression, as well as color Doppler and duplex ultrasound were performed to evaluate the lower extremity deep venous systems from the level of the common femoral vein and including the common femoral, femoral, profunda femoral, popliteal and calf veins including the posterior tibial, peroneal and gastrocnemius veins when visible. The superficial great saphenous vein  was also interrogated. Spectral Doppler was utilized to evaluate flow at rest and with distal augmentation maneuvers in the common femoral, femoral and popliteal veins. COMPARISON:  None Available. FINDINGS: RIGHT LOWER EXTREMITY Common Femoral Vein: Vessel is partially compressible. Nonocclusive thrombus versus sequelae of prior DVT present in the common femoral vein. Saphenofemoral Junction: No evidence of thrombus. Normal compressibility and flow on color Doppler imaging. Profunda Femoral Vein: Mild eccentric wall thickening with incomplete compressibility. Femoral Vein: Mild eccentric wall thickening in the femoral vein proximally. Absence of flow in the mid thigh consistent with occlusive thrombus. Popliteal Vein: Partial compressibility.  Color flow is present.  Calf Veins: Thrombus present within the posterior tibial veins. The peroneal vein is not well seen. Superficial Great Saphenous Vein: No evidence of thrombus. Normal compressibility. Venous Reflux:  None. Other Findings:  None. LEFT LOWER EXTREMITY Common Femoral Vein: Eccentric wall thickening consistent with nonocclusive thrombus. Saphenofemoral Junction: No evidence of thrombus. Normal compressibility and flow on color Doppler imaging. Profunda Femoral Vein: Eccentric wall thickening. There is persistent vascular patency. Femoral Vein: Patent but incompletely compressible secondary to eccentric wall thickening. Popliteal Vein: Popliteal vein is patent. Calf Veins: Noncompressibility of the calf vessels consistent with DVT. Superficial Great Saphenous Vein: No evidence of thrombus. Normal compressibility. Venous Reflux:  None. Other Findings:  None. IMPRESSION: 1. Likely a mix of acute and chronic DVT in the bilateral lower extremities. 2. Nonocclusive thrombus present within the bilateral common femoral veins and right superficial femoral and popliteal veins. 3. Occlusive thrombus present in the left mid femoral vein. Nonocclusive thrombus in the left popliteal vein. 4. Probable occlusive thrombus in the bilateral posterior tibial veins in the calves. Electronically Signed   By: Jacqulynn Cadet M.D.   On: 11/17/2021 17:29   DG OR UROLOGY CYSTO IMAGE ([facility] ONLY)  Result Date: 11/17/2021 There is no interpretation for this exam.  This order is for images obtained during a surgical procedure.  Please See "Surgeries" Tab for more information regarding the procedure.    Microbiology: Results for orders placed or performed during the hospital encounter of 11/16/21  Urine Culture     Status: None   Collection Time: 11/16/21  9:07 PM   Specimen: Urine, Clean Catch  Result Value Ref Range Status   Specimen Description   Final    URINE, CLEAN CATCH Performed at [facility], [address],  [place], [place] [address]    Special Requests   Final    NONE Performed at [facility], [address] [place], [place] [address]    Culture   Final    NO GROWTH Performed at [facility], [place] [address], [place], [place] [address]    Report Status 11/18/2021 FINAL  Final    Labs: CBC: Recent Labs  Lab 11/16/21 1827 11/17/21 0109 11/17/21 0950 11/17/21 1209 11/17/21 1856 11/18/21 0534 11/18/21 1707 11/19/21 0338  WBC 6.0 7.4  --  4.7  --  4.6  --  4.9  NEUTROABS 4.0  --   --   --   --   --   --   --   HGB 9.7* 9.1*   < > 7.7* 9.1* 9.1* 10.0* 9.0*  HCT 31.7* 29.4*   < > 24.6* 28.7* 28.7* 32.5* 28.8*  MCV 84.8 83.8  --  83.1  --  85.2  --  85.5  PLT 208 198  --  177  --  175  --  189   < > =  values in this interval not displayed.   Basic Metabolic Panel: Recent Labs  Lab 11/16/21 1827 11/17/21 0109 11/18/21 0534 11/19/21 0338  NA 137 136 139 139  K 3.4* 3.2* 3.9 4.0  CL 103 100 109 107  CO2 '25 29 25 26  '$ GLUCOSE 138* 113* 112* 111*  BUN '22 21 19 21  '$ CREATININE 0.94 0.87 0.74 0.81  CALCIUM 8.8* 8.6* 8.4* 8.6*   Liver Function Tests: Recent Labs  Lab 11/16/21 1827 11/17/21 0109  AST 24 21  ALT 11 10  ALKPHOS 49 44  BILITOT 0.7 0.7  PROT 7.2 6.6  ALBUMIN 3.2* 2.8*   CBG: Recent Labs  Lab 11/18/21 0711 11/18/21 1158 11/18/21 1642 11/18/21 2053 11/19/21 0739  GLUCAP 118* 151* 93 120* 110*    Discharge time spent: greater than 30 minutes.  Signed: Max Sane, MD [facility] 11/22/2021

## 2021-11-26 ENCOUNTER — Inpatient Hospital Stay: Payer: Medicare (Managed Care)

## 2021-11-26 ENCOUNTER — Inpatient Hospital Stay: Payer: Medicare (Managed Care) | Attending: Nurse Practitioner | Admitting: Oncology

## 2021-11-26 ENCOUNTER — Encounter: Payer: Self-pay | Admitting: Oncology

## 2021-11-26 VITALS — BP 114/71 | HR 89 | Temp 97.2°F | Wt 182.3 lb

## 2021-11-26 DIAGNOSIS — I110 Hypertensive heart disease with heart failure: Secondary | ICD-10-CM | POA: Insufficient documentation

## 2021-11-26 DIAGNOSIS — Z803 Family history of malignant neoplasm of breast: Secondary | ICD-10-CM | POA: Insufficient documentation

## 2021-11-26 DIAGNOSIS — D500 Iron deficiency anemia secondary to blood loss (chronic): Secondary | ICD-10-CM

## 2021-11-26 DIAGNOSIS — E119 Type 2 diabetes mellitus without complications: Secondary | ICD-10-CM | POA: Insufficient documentation

## 2021-11-26 DIAGNOSIS — I82503 Chronic embolism and thrombosis of unspecified deep veins of lower extremity, bilateral: Secondary | ICD-10-CM | POA: Insufficient documentation

## 2021-11-26 DIAGNOSIS — Z7901 Long term (current) use of anticoagulants: Secondary | ICD-10-CM | POA: Insufficient documentation

## 2021-11-26 DIAGNOSIS — I509 Heart failure, unspecified: Secondary | ICD-10-CM | POA: Insufficient documentation

## 2021-11-26 DIAGNOSIS — Z86711 Personal history of pulmonary embolism: Secondary | ICD-10-CM | POA: Diagnosis not present

## 2021-11-26 DIAGNOSIS — C541 Malignant neoplasm of endometrium: Secondary | ICD-10-CM | POA: Diagnosis present

## 2021-11-26 DIAGNOSIS — N939 Abnormal uterine and vaginal bleeding, unspecified: Secondary | ICD-10-CM | POA: Insufficient documentation

## 2021-11-26 DIAGNOSIS — Z87440 Personal history of urinary (tract) infections: Secondary | ICD-10-CM | POA: Diagnosis not present

## 2021-11-26 DIAGNOSIS — I82403 Acute embolism and thrombosis of unspecified deep veins of lower extremity, bilateral: Secondary | ICD-10-CM | POA: Diagnosis not present

## 2021-11-26 DIAGNOSIS — R310 Gross hematuria: Secondary | ICD-10-CM | POA: Diagnosis not present

## 2021-11-26 LAB — MAGNESIUM: Magnesium: 2.1 mg/dL (ref 1.7–2.4)

## 2021-11-26 LAB — CBC WITH DIFFERENTIAL/PLATELET
Abs Immature Granulocytes: 0.02 10*3/uL (ref 0.00–0.07)
Basophils Absolute: 0 10*3/uL (ref 0.0–0.1)
Basophils Relative: 1 %
Eosinophils Absolute: 0.2 10*3/uL (ref 0.0–0.5)
Eosinophils Relative: 4 %
HCT: 28.3 % — ABNORMAL LOW (ref 36.0–46.0)
Hemoglobin: 8.5 g/dL — ABNORMAL LOW (ref 12.0–15.0)
Immature Granulocytes: 0 %
Lymphocytes Relative: 15 %
Lymphs Abs: 0.9 10*3/uL (ref 0.7–4.0)
MCH: 27.5 pg (ref 26.0–34.0)
MCHC: 30 g/dL (ref 30.0–36.0)
MCV: 91.6 fL (ref 80.0–100.0)
Monocytes Absolute: 0.3 10*3/uL (ref 0.1–1.0)
Monocytes Relative: 6 %
Neutro Abs: 4.2 10*3/uL (ref 1.7–7.7)
Neutrophils Relative %: 74 %
Platelets: 248 10*3/uL (ref 150–400)
RBC: 3.09 MIL/uL — ABNORMAL LOW (ref 3.87–5.11)
RDW: 23.3 % — ABNORMAL HIGH (ref 11.5–15.5)
WBC: 5.7 10*3/uL (ref 4.0–10.5)
nRBC: 0 % (ref 0.0–0.2)

## 2021-11-26 LAB — COMPREHENSIVE METABOLIC PANEL
ALT: 12 U/L (ref 0–44)
AST: 22 U/L (ref 15–41)
Albumin: 3.4 g/dL — ABNORMAL LOW (ref 3.5–5.0)
Alkaline Phosphatase: 44 U/L (ref 38–126)
Anion gap: 9 (ref 5–15)
BUN: 22 mg/dL (ref 8–23)
CO2: 27 mmol/L (ref 22–32)
Calcium: 8.9 mg/dL (ref 8.9–10.3)
Chloride: 101 mmol/L (ref 98–111)
Creatinine, Ser: 1.08 mg/dL — ABNORMAL HIGH (ref 0.44–1.00)
GFR, Estimated: 57 mL/min — ABNORMAL LOW (ref >60)
Glucose, Bld: 113 mg/dL — ABNORMAL HIGH (ref 70–99)
Potassium: 3.6 mmol/L (ref 3.5–5.1)
Sodium: 137 mmol/L (ref 135–145)
Total Bilirubin: 0.9 mg/dL (ref 0.3–1.2)
Total Protein: 7.2 g/dL (ref 6.5–8.1)

## 2021-11-26 LAB — IRON AND TIBC
Iron: 31 ug/dL (ref 28–170)
Saturation Ratios: 9 % — ABNORMAL LOW (ref 10.4–31.8)
TIBC: 337 ug/dL (ref 250–450)
UIBC: 306 ug/dL

## 2021-11-26 LAB — FERRITIN: Ferritin: 18 ng/mL (ref 11–307)

## 2021-11-26 MED ORDER — SODIUM CHLORIDE 0.9 % IV SOLN
Freq: Once | INTRAVENOUS | Status: AC
  Filled 2021-11-26: qty 250

## 2021-11-26 MED ORDER — RIVAROXABAN 10 MG PO TABS: 10.0000 mg | ORAL_TABLET | Freq: Every day | ORAL | 0 refills | Status: DC

## 2021-11-26 MED ORDER — IRON SUCROSE 20 MG/ML IV SOLN
200.0000 mg | Freq: Once | INTRAVENOUS | Status: AC
  Administered 2021-11-26: 200 mg via INTRAVENOUS
  Filled 2021-11-26: qty 10

## 2021-11-26 MED ORDER — SODIUM CHLORIDE 0.9 % IV SOLN: 200.0000 mg | Freq: Once | INTRAVENOUS | Status: DC

## 2021-11-27 ENCOUNTER — Telehealth: Payer: Self-pay | Admitting: Student

## 2021-11-27 NOTE — Telephone Encounter (Signed)
Attempted to contact daughter, Courtney Mack, to schedule Palliative Consult, no answer - left message with reason for call along with my name and call back number

## 2021-11-27 NOTE — Progress Notes (Signed)
Hematology/Oncology Progress note [phone] [phone]      Patient Care Team: Center, [facility] as PCP - General (General Practice) Clent Jacks, RN as Oncology Nurse Navigator Earlie Server, MD as Consulting Physician (Hematology and Oncology)  REFERRING PROVIDER: Center, [place] COMPLAINTS/REASON FOR VISIT:   endometrial cancer, iron deficiency anemia, recurrent thrombosis  HISTORY OF PRESENTING ILLNESS:   Courtney Mack is a  66 y.o.  female with PMH listed below was seen in consultation at the request of  Center, [place]*  for evaluation of endometrial cancer.  #February 2022, patient was hospitalized to due to left lower extremity DVT, extensive bilateral PE, status post embolectomy and IVC filter placement.  Patient was discharged on anticoagulation with Xarelto. Patient reports that she has had intermittent vaginal bleeding for the past few years.  After she was started on Xarelto, she started to experience frequent vaginal bleeding 09/06/2020, US pelvic complete showed a diffusely thickened and heterogeneous appearance of the endometrium, concerning for endometrial carcinoma.-  Uterus: Measurements: 10.4 x 5.2 x 5.9 cm = volume: 166 mL. No fibroids or other mass visualized. Endometrium Thickness: 29. The endometrium is diffusely heterogeneous and thickened with increased vascularity.Right and left ovaries: Not visualized.  No abnormal free fluid  Patient was evaluated by gynecology Dr. Amalia Hailey.  On physical examination, patient had malodorous friable necrotic appearing cervix was seen. Cervical biopsy performed. An endometrial biopsy was attempted but the cervical os was not well visualized.   # 09/06/2020 Cervical and endometrial biopsies A. UTERINE CERVIX; BIOPSY:   ENDOMETRIOID ENDOMETRIAL ADENOCARCINOMA, FAVOR LOW GRADE (FIGO GRADE 1-2).   B. ENDOMETRIUM; BIOPSY: - ENDOMETRIOID ENDOMETRIAL ADENOCARCINOMA,  FAVOR LOW GRADE (FIGO GRADE 1-2).  She presents for evaluation. She continues to have bleeding.  Patient was seen by gynecology oncology Dr. Theora Gianotti on 09/13/2020.  Patient is not a surgical candidate.  Refer to radiation oncology and my dog for further evaluation.  Patient presented to emergency room on 09/16/2020, 09/20/2020 for vaginal bleeding and passing clots.. 09/16/2020, hemoglobin 9.7, MCV 80 09/20/2020, hemoglobin 8.8, MCV 80.5. 08/24/2020, iron saturation 5, ferritin 12.  Today patient has been seen by radiation oncology.  PET scan has been scheduled on 09/28/2020. Due to the ongoing bleeding, decision was made to proceed with radiation on 09/25/2020. She reports very tired and fatigued.  Ongoing vaginal bleeding and passing blood clots..  Denies any chest pain, shortness of breath.Some left lower abdomen discomfort.  Chronic other medical problems include hypertension, morbid obesity, diabetes, chronic anticoagulation for recent bilateral extensive PE and a DVT.  01/02/2021 - 01/05/2021, patient was hospitalized due to worsening of bilateral lower extremity swelling.  She was found to have extensive occlusive thrombus throughout both femoral vein.  CT angiogram of the chest also showed small volume of chronic and nonocclusive PE. Patient was started on heparin drip.  Status post embolectomy by vascular surgeon.  Patient at that time claimed that she has been compliant with Xarelto.  After discussing with hospitalist, decision was made to switch patient to Lovenox injection after discharge.  Prescription was sent by hospitalist.  [employer] initially did not approve her Lovenox.  Patient ended up restarted on Xarelto for anticoagulation. Today patient was accompanied by her brother. Per patient and her brother, family realized that patient had ran out of Xarelto for about at least 2 weeks prior to the onset of her symptoms. With additional history, it is possible that patient developed a  recurrent thrombosis due to not being on anticoagulation.  Patient was recommended to resume Xarelto 20 mg daily.  INTERVAL HISTORY Courtney Mack is a 66 y.o. female who has above history reviewed by me today presents for posthospitalization follow-up for recurrent thrombosis.  Gross hematuria, history of GI bleeding, history of endometrial cancer status post radiation and brachytherapy. 11/16/2021 - 11/19/2021, patient was admitted due to gross hematuria.  Patient is anemic is status post PRBC transfusion. During this admission, patient had a CT scan done which showed small clot burden/pulm emboli in the right posterior pulmonary artery.  Ultrasound Doppler showed acute on chronic DVT.  Patient has been off Xarelto due to hematuria prior to the admission for approximately 1 months.  Hematuria was attributed to her ureteral stents.  Patient was started on heparin drip.  Patient was evaluated by urology. S/p cystoscopy with removal and replacement of left double-J stent..   hematuria likely secondary to radiation cystitis.  Patient also  was treated for UTI with Rocephin.  Urine and blood cultures were negative.  At discharge, patient was resumed on Xarelto 10 mg daily. Today patient presents for posthospitalization follow-up. She denies any additional episodes of gross hematuria.  Continues to feel weak.  Review of Systems  Constitutional:  Negative for appetite change, chills, fatigue and fever.  HENT:   Negative for hearing loss and voice change.   Eyes:  Negative for eye problems.  Respiratory:  Negative for chest tightness and cough.   Cardiovascular:  Positive for leg swelling. Negative for chest pain.  Gastrointestinal:  Negative for abdominal distention, abdominal pain and blood in stool.  Endocrine: Negative for hot flashes.  Genitourinary:  Negative for difficulty urinating, frequency and vaginal bleeding.   Musculoskeletal:  Negative for arthralgias.  Skin:  Negative for itching  and rash.  Neurological:  Negative for extremity weakness.  Hematological:  Negative for adenopathy.  Psychiatric/Behavioral:  Negative for confusion.      MEDICAL HISTORY:  Past Medical History:  Diagnosis Date   Acute bilateral deep vein thrombosis (DVT) of femoral veins ([place]) 01/02/2021   Acute massive pulmonary embolism ([place]) 08/25/2020   Last Assessment & Plan:  Formatting of this note might be different from the original. Post vena caval filter and on xarelto and O2   ARF (acute respiratory failure) ([place])    Cancer ([place])    endometrial   Cellulitis of left lower leg 06/20/2021   CHF (congestive heart failure) ([place])    COVID-19    Diabetes mellitus without complication ([place])    DVT (deep venous thrombosis) ([place]) 09/06/2020   High cholesterol    Hx of blood clots    Hyperlipidemia    Hypertension    IDA (iron deficiency anemia) 09/21/2020   Obesity    Pressure injury of skin 06/21/2021   Pulmonary embolism ([place])    Sepsis ([place])    Thrombocytopenia ([place])    Urinary tract infection 09/13/2021    SURGICAL HISTORY: Past Surgical History:  Procedure Laterality Date   COLONOSCOPY N/A 06/21/2021   Procedure: COLONOSCOPY;  Surgeon: Toledo, Benay Pike, MD;  Location: [facility] ENDOSCOPY;  Service: Gastroenterology;  Laterality: N/A;   CYSTOSCOPY W/ URETERAL STENT PLACEMENT Left 09/11/2021   Procedure: CYSTOSCOPY WITH RETROGRADE PYELOGRAM/URETERAL STENT PLACEMENT;  Surgeon: Abbie Sons, MD;  Location: [facility] ORS;  Service: Urology;  Laterality: Left;   CYSTOSCOPY W/ URETERAL STENT PLACEMENT Left 11/17/2021   Procedure: CYSTOSCOPY WITH STENT REPLACEMENT;  Surgeon: Irine Seal, MD;  Location: [facility] ORS;  Service: Urology;  Laterality: Left;   CYSTOSCOPY WITH FULGERATION  11/17/2021   Procedure: CYSTOSCOPY WITH FULGERATION;  Surgeon: Irine Seal, MD;  Location: [facility] ORS;  Service: Urology;;   CYSTOSCOPY WITH STENT PLACEMENT Left 10/17/2021   Procedure: CYSTOSCOPY WITH STENT PLACEMENT;   Surgeon: Abbie Sons, MD;  Location: [facility] ORS;  Service: Urology;  Laterality: Left;   CYSTOSCOPY WITH URETEROSCOPY, STONE BASKETRY AND STENT PLACEMENT Left 10/16/2021   Procedure: CYSTOSCOPY WITH URETEROSCOPY  AND STENT REMOVAL;  Surgeon: Abbie Sons, MD;  Location: [facility] ORS;  Service: Urology;  Laterality: Left;   ENTEROSCOPY N/A 08/17/2021   Procedure: ENTEROSCOPY;  Surgeon: Lin Landsman, MD;  Location: [facility] ENDOSCOPY;  Service: Gastroenterology;  Laterality: N/A;   ESOPHAGOGASTRODUODENOSCOPY N/A 06/21/2021   Procedure: ESOPHAGOGASTRODUODENOSCOPY (EGD);  Surgeon: Toledo, Benay Pike, MD;  Location: [facility] ENDOSCOPY;  Service: Gastroenterology;  Laterality: N/A;   ESOPHAGOGASTRODUODENOSCOPY (EGD) WITH PROPOFOL N/A 08/17/2021   Procedure: ESOPHAGOGASTRODUODENOSCOPY (EGD) WITH PROPOFOL;  Surgeon: Lin Landsman, MD;  Location: [facility] ENDOSCOPY;  Service: Gastroenterology;  Laterality: N/A;   ESOPHAGOGASTRODUODENOSCOPY (EGD) WITH PROPOFOL N/A 10/14/2021   Procedure: ESOPHAGOGASTRODUODENOSCOPY (EGD) WITH PROPOFOL;  Surgeon: [name], MD;  Location: [facility] ENDOSCOPY;  Service: Endoscopy;  Laterality: N/A;   GIVENS CAPSULE STUDY N/A 06/22/2021   Procedure: GIVENS CAPSULE STUDY;  Surgeon: Toledo, Benay Pike, MD;  Location: [facility] ENDOSCOPY;  Service: Gastroenterology;  Laterality: N/A;   IVC FILTER INSERTION N/A 08/18/2020   Procedure: IVC FILTER INSERTION;  Surgeon: Katha Cabal, MD;  Location: [place] CV LAB;  Service: Cardiovascular;  Laterality: N/A;   PERIPHERAL VASCULAR THROMBECTOMY Bilateral 01/03/2021   Procedure: PERIPHERAL VASCULAR THROMBECTOMY;  Surgeon: Algernon Huxley, MD;  Location: [place] CV LAB;  Service: Cardiovascular;  Laterality: Bilateral;   PULMONARY THROMBECTOMY N/A 08/18/2020   Procedure: PULMONARY THROMBECTOMY;  Surgeon: Katha Cabal, MD;  Location: [place] CV LAB;  Service: Cardiovascular;  Laterality: N/A;   TUBAL LIGATION     VISCERAL  ANGIOGRAPHY N/A 07/18/2021   Procedure: VISCERAL ANGIOGRAPHY;  Surgeon: Algernon Huxley, MD;  Location: [place] CV LAB;  Service: Cardiovascular;  Laterality: N/A;    SOCIAL HISTORY: Social History   Socioeconomic History   Marital status: Single    Spouse name: Not on file   Number of children: Not on file   Years of education: Not on file   Highest education level: Not on file  Occupational History   Not on file  Tobacco Use   Smoking status: Never   Smokeless tobacco: Never   Tobacco comments:    smoked 2-4 cigarettes for about 2-3 weeks   Vaping Use   Vaping Use: Never used  Substance and Sexual Activity   Alcohol use: Not Currently   Drug use: Not Currently   Sexual activity: Not on file  Other Topics Concern   Not on file  Social History Narrative   Not on file   Social Determinants of Health   Financial Resource Strain: Not on file  Food Insecurity: Not on file  Transportation Needs: Not on file  Physical Activity: Not on file  Stress: Not on file  Social Connections: Not on file  Intimate Partner Violence: Not on file    FAMILY HISTORY: Family History  Problem Relation Age of Onset   Hypertension Mother    Diabetes Mother    Diabetes Father    Hypertension Father    High Cholesterol Father    Congestive Heart Failure Father  Breast cancer Cousin     ALLERGIES:  has No Known Allergies.  MEDICATIONS:  Current Outpatient Medications  Medication Sig Dispense Refill   acetaminophen (TYLENOL) 500 MG tablet Take 500 mg by mouth every 6 (six) hours as needed for mild pain or fever.     Ascorbic Acid (VITAMIN C) 1000 MG tablet Take 1,000 mg by mouth daily.     BREO ELLIPTA 100-25 MCG/ACT AEPB Inhale 1 puff into the lungs daily.     Ferrous Sulfate (IRON) 325 (65 Fe) MG TABS Take 325 mg by mouth every other day.     furosemide (LASIX) 20 MG tablet Take 1 tablet (20 mg total) by mouth daily. (Patient taking differently: Take 20 mg by mouth every  morning.) 30 tablet 11   loperamide (IMODIUM) 2 MG capsule Take 1 capsule (2 mg total) by mouth as needed for diarrhea or loose stools. 30 capsule 0   mometasone-formoterol (DULERA) 100-5 MCG/ACT AERO Inhale 2 puffs into the lungs 2 (two) times daily.     Multiple Vitamin (MULTIVITAMIN WITH MINERALS) TABS tablet Take 1 tablet by mouth daily.     pantoprazole (PROTONIX) 40 MG tablet Take 40 mg by mouth every morning.     rosuvastatin (CRESTOR) 10 MG tablet Take 1 tablet (10 mg total) by mouth daily. (Patient taking differently: Take 10 mg by mouth every morning.) 15 tablet 0   rivaroxaban (XARELTO) 10 MG TABS tablet Take 1 tablet (10 mg total) by mouth daily. 30 tablet 0   No current facility-administered medications for this visit.     PHYSICAL EXAMINATION: ECOG PERFORMANCE STATUS: 2 - Symptomatic, <50% confined to bed Vitals:   11/26/21 1438  BP: 114/71  Pulse: 89  Temp: (!) 97.2 F (36.2 C)   Filed Weights   11/26/21 1438  Weight: 182 lb 4.8 oz (82.7 kg)     Physical Exam Constitutional:      General: She is not in acute distress.    Appearance: She is obese.     Comments: Patient sits in the wheelchair  HENT:     Head: Normocephalic and atraumatic.  Eyes:     General: No scleral icterus. Cardiovascular:     Rate and Rhythm: Normal rate and regular rhythm.     Heart sounds: Normal heart sounds.  Pulmonary:     Effort: Pulmonary effort is normal. No respiratory distress.     Breath sounds: No wheezing.  Abdominal:     General: Bowel sounds are normal. There is no distension.     Palpations: Abdomen is soft.  Musculoskeletal:        General: Swelling present.     Cervical back: Normal range of motion and neck supple.     Comments: Lower extremities 3+ edema bilaterally  Skin:    General: Skin is warm and dry.     Coloration: Skin is not pale.     Findings: No erythema or rash.  Neurological:     Mental Status: She is alert and oriented to person, place, and time.  Mental status is at baseline.     Cranial Nerves: No cranial nerve deficit.     Coordination: Coordination normal.  Psychiatric:        Mood and Affect: Mood normal.    LABORATORY DATA:  I have reviewed the data as listed Lab Results  Component Value Date   WBC 5.7 11/26/2021   HGB 8.5 (L) 11/26/2021   HCT 28.3 (L) 11/26/2021   MCV 91.6 11/26/2021  PLT 248 11/26/2021   Recent Labs    11/16/21 1827 11/17/21 0109 11/18/21 0534 11/19/21 0338 11/26/21 1359  NA 137 136 139 139 137  K 3.4* 3.2* 3.9 4.0 3.6  CL 103 100 109 107 101  CO2 '25 29 25 26 27  '$ GLUCOSE 138* 113* 112* 111* 113*  BUN '22 21 19 21 22  '$ CREATININE 0.94 0.87 0.74 0.81 1.08*  CALCIUM 8.8* 8.6* 8.4* 8.6* 8.9  GFRNONAA >60 >60 >60 >60 57*  PROT 7.2 6.6  --   --  7.2  ALBUMIN 3.2* 2.8*  --   --  3.4*  AST 24 21  --   --  22  ALT 11 10  --   --  12  ALKPHOS 49 44  --   --  44  BILITOT 0.7 0.7  --   --  0.9    Iron/TIBC/Ferritin/ %Sat    Component Value Date/Time   IRON 31 11/26/2021 1359   TIBC 337 11/26/2021 1359   FERRITIN 18 11/26/2021 1359   IRONPCTSAT 9 (L) 11/26/2021 1359      RADIOGRAPHIC STUDIES: I have personally reviewed the radiological images as listed and agreed with the findings in the report. CT Angio Chest Pulmonary Embolism (PE) W or WO Contrast  Result Date: 11/17/2021 CLINICAL DATA:  Pulmonary embolism (PE) suspected, high prob EXAM: CT ANGIOGRAPHY CHEST WITH CONTRAST TECHNIQUE: Multidetector CT imaging of the chest was performed using the standard protocol during bolus administration of intravenous contrast. Multiplanar CT image reconstructions and MIPs were obtained to evaluate the vascular anatomy. RADIATION DOSE REDUCTION: This exam was performed according to the departmental dose-optimization program which includes automated exposure control, adjustment of the mA and/or kV according to patient size and/or use of iterative reconstruction technique. CONTRAST:  122m OMNIPAQUE IOHEXOL  350 MG/ML SOLN COMPARISON:  09/11/2021 FINDINGS: Cardiovascular: Small filling defects are seen in the posterior right lower lobe pulmonary arterial branch compatible with pulmonary embolus. No additional filling defects. Heart is normal size. No evidence of right heart strain. Aorta normal caliber. Scattered aortic calcifications. Mediastinum/Nodes: No mediastinal, hilar, or axillary adenopathy. Trachea and esophagus are unremarkable. Thyroid unremarkable. Lungs/Pleura: Atelectasis or scarring in the anterior lingula. No acute confluent airspace opacities or effusions. Upper Abdomen: No acute findings Musculoskeletal: Chest wall soft tissues are unremarkable. No acute bony abnormality. Review of the MIP images confirms the above findings. IMPRESSION: Small pulmonary embolus seen in the posterior right lower lobe pulmonary arterial branch. No evidence of right heart strain. Aortic Atherosclerosis (ICD10-I70.0). Electronically Signed   By: KRolm BaptiseM.D.   On: 11/17/2021 00:30   CT ABDOMEN PELVIS W CONTRAST  Result Date: 11/17/2021 CLINICAL DATA:  Hydronephrosis UTI, recurrent/complicated (Female) Sepsis UPJ obstruction suspected (Ped 0-17y) Hematuria, gross/macroscopic Nephrolithiasis. Vaginal bleeding. EXAM: CT ABDOMEN AND PELVIS WITH CONTRAST TECHNIQUE: Multidetector CT imaging of the abdomen and pelvis was performed using the standard protocol following bolus administration of intravenous contrast. RADIATION DOSE REDUCTION: This exam was performed according to the departmental dose-optimization program which includes automated exposure control, adjustment of the mA and/or kV according to patient size and/or use of iterative reconstruction technique. CONTRAST:  1039mOMNIPAQUE IOHEXOL 350 MG/ML SOLN COMPARISON:  10/16/2021 FINDINGS: Lower chest: No acute abnormality Hepatobiliary: No focal hepatic abnormality. Gallbladder unremarkable. Pancreas: No focal abnormality or ductal dilatation. Spleen: No focal  abnormality.  Normal size. Adrenals/Urinary Tract: Left ureteral stent in place. There is mild left hydronephrosis, similar to prior study. No hydronephrosis on the right. No renal  or adrenal mass. Urinary bladder is decompressed. Bladder wall appears thickened. Stomach/Bowel: Stomach, large and small bowel grossly unremarkable. Vascular/Lymphatic: Aortic atherosclerosis. No evidence of aneurysm or adenopathy. IVC filter in place. Reproductive: Uterus and adnexa unremarkable.  No mass. Other: No free fluid or free air. Musculoskeletal: No acute bony abnormality. Degenerative changes in the lumbar spine. IMPRESSION: Left ureteral stent in place with continued mild left hydronephrosis, stable since prior study. Bladder is decompressed. Bladder wall appears thickened. Recommend clinical correlation to exclude cystitis. Electronically Signed   By: Rolm Baptise M.D.   On: 11/17/2021 00:33   US Venous Img Lower Bilateral (DVT)  Result Date: 11/17/2021 CLINICAL DATA:  Pulmonary embolism, lower extremity swelling EXAM: BILATERAL LOWER EXTREMITY VENOUS DOPPLER ULTRASOUND TECHNIQUE: Gray-scale sonography with graded compression, as well as color Doppler and duplex ultrasound were performed to evaluate the lower extremity deep venous systems from the level of the common femoral vein and including the common femoral, femoral, profunda femoral, popliteal and calf veins including the posterior tibial, peroneal and gastrocnemius veins when visible. The superficial great saphenous vein was also interrogated. Spectral Doppler was utilized to evaluate flow at rest and with distal augmentation maneuvers in the common femoral, femoral and popliteal veins. COMPARISON:  None Available. FINDINGS: RIGHT LOWER EXTREMITY Common Femoral Vein: Vessel is partially compressible. Nonocclusive thrombus versus sequelae of prior DVT present in the common femoral vein. Saphenofemoral Junction: No evidence of thrombus. Normal compressibility and  flow on color Doppler imaging. Profunda Femoral Vein: Mild eccentric wall thickening with incomplete compressibility. Femoral Vein: Mild eccentric wall thickening in the femoral vein proximally. Absence of flow in the mid thigh consistent with occlusive thrombus. Popliteal Vein: Partial compressibility.  Color flow is present. Calf Veins: Thrombus present within the posterior tibial veins. The peroneal vein is not well seen. Superficial Great Saphenous Vein: No evidence of thrombus. Normal compressibility. Venous Reflux:  None. Other Findings:  None. LEFT LOWER EXTREMITY Common Femoral Vein: Eccentric wall thickening consistent with nonocclusive thrombus. Saphenofemoral Junction: No evidence of thrombus. Normal compressibility and flow on color Doppler imaging. Profunda Femoral Vein: Eccentric wall thickening. There is persistent vascular patency. Femoral Vein: Patent but incompletely compressible secondary to eccentric wall thickening. Popliteal Vein: Popliteal vein is patent. Calf Veins: Noncompressibility of the calf vessels consistent with DVT. Superficial Great Saphenous Vein: No evidence of thrombus. Normal compressibility. Venous Reflux:  None. Other Findings:  None. IMPRESSION: 1. Likely a mix of acute and chronic DVT in the bilateral lower extremities. 2. Nonocclusive thrombus present within the bilateral common femoral veins and right superficial femoral and popliteal veins. 3. Occlusive thrombus present in the left mid femoral vein. Nonocclusive thrombus in the left popliteal vein. 4. Probable occlusive thrombus in the bilateral posterior tibial veins in the calves. Electronically Signed   By: Jacqulynn Cadet M.D.   On: 11/17/2021 17:29   DG OR UROLOGY CYSTO IMAGE ([facility] ONLY)  Result Date: 11/17/2021 There is no interpretation for this exam.  This order is for images obtained during a surgical procedure.  Please See "Surgeries" Tab for more information regarding the procedure.       ASSESSMENT  & PLAN:  1. Iron deficiency anemia due to chronic blood loss   2. Recurrent acute deep vein thrombosis (DVT) of both lower extremities (HCC)   3. Gross hematuria    #Recurrent extensive lower extremity thrombosis, small volume pulmonary embolism. Wean benefits and risks of anticoagulation, I recommend patient to continue Xarelto 10 mg daily.  #Gross hematuria, improved.  No additional episodes.  Recommend patient to follow-up with urology.  #Stage II endometrial cancer with cervical involvement or cervical cancer.  Not a candidate of surgery and chemotherapy.  Status post radiation and brachytherapy. No vaginal bleeding. Recommend her to follow up with gynecology oncology for follow-up and pelvic examination.   #Iron deficiency anemia, labs showed hemoglobin 8.5, iron panel is consistent with iron deficiency. Patient received Venofer today. We will schedule patient for additional IV Venofer weekly x 3  All questions were answered. The patient knows to call the clinic with any problems questions or concerns.   Earlie Server, MD, PhD Hematology Oncology  11/27/2021

## 2021-11-27 NOTE — Progress Notes (Unsigned)
   Patient ID: Courtney Mack, female    DOB: 07/12/1955, 66 y.o.   MRN: [id number]  HPI  Ms Wisdom is a 66 y/o female with a history of  Echo report from 09/12/21 reviewed and showed an EF of 55-60% along with mild LVH/ LAE & mild MR.   Admitted 11/16/21 due to recurrent gross hematuria and anemia. Urology and medical oncology consults obtained. Chest CT showed small right PE. Heparin drip started.  S/p cystoscopy with removal and replacement of left double-J stent. Discharged after 3 days. Admitted 10/16/21 due to c/o abdominal pain, generalized weakness, fevers and chills.Patient underwent left ureteroscopy on 4/12 which was negative for obstructive stone. Needed vasopressin support due to hypotension. Urology and palliative care consults obtained. Given PRBC's due to anemia. Antibiotics given due to sepsis. Initially needed oxygen but able to be weaned to room air. Xarelto held. Discharged after 7 days. Admitted 10/12/21 due to hemoglobin of 6.4. Two units of PRBC's given along with IV protonix. GI consult obtained. EGD completed. Discharged after 2 days.   She presents today for her initial visit with a chief complaint of   Review of Systems    Physical Exam    Assessment & Plan:  1: Chronic heart failure with preserved ejection fraction with structural changes (LVH/LAE)- - NYHA class  - saw cardiology Rockey Situ) 10/04/21 - BNP 10/17/21 was 927.6  2: HTN- - BP - saw PCP at [facility] 11/26/21 reviewed and showed sodium 137, potassium 3.6, creatinine 1.08 and GFR 57  3: DM- - A1c 11/16/21 was 5.7%  4: Anemia- - hemoglobin 11/26/21 was 8.5 - saw hematology Tasia Catchings) 11/26/21

## 2021-11-28 ENCOUNTER — Telehealth: Payer: Self-pay | Admitting: Family

## 2021-11-28 ENCOUNTER — Ambulatory Visit: Payer: Medicare (Managed Care) | Admitting: Family

## 2021-11-28 NOTE — Telephone Encounter (Signed)
Patient did not show for her [facility] appointment on 11/28/21. Will attempt to reschedule.

## 2021-11-29 ENCOUNTER — Ambulatory Visit: Payer: Medicare (Managed Care) | Attending: Family | Admitting: Family

## 2021-11-29 ENCOUNTER — Encounter: Payer: Self-pay | Admitting: Family

## 2021-11-29 VITALS — BP 114/54 | HR 87 | Resp 18 | Ht 59.0 in | Wt 173.0 lb

## 2021-11-29 DIAGNOSIS — R319 Hematuria, unspecified: Secondary | ICD-10-CM | POA: Diagnosis not present

## 2021-11-29 DIAGNOSIS — R0602 Shortness of breath: Secondary | ICD-10-CM | POA: Diagnosis not present

## 2021-11-29 DIAGNOSIS — I110 Hypertensive heart disease with heart failure: Secondary | ICD-10-CM | POA: Diagnosis not present

## 2021-11-29 DIAGNOSIS — E119 Type 2 diabetes mellitus without complications: Secondary | ICD-10-CM

## 2021-11-29 DIAGNOSIS — I890 Lymphedema, not elsewhere classified: Secondary | ICD-10-CM | POA: Diagnosis not present

## 2021-11-29 DIAGNOSIS — D649 Anemia, unspecified: Secondary | ICD-10-CM

## 2021-11-29 DIAGNOSIS — R5383 Other fatigue: Secondary | ICD-10-CM | POA: Diagnosis not present

## 2021-11-29 DIAGNOSIS — I2699 Other pulmonary embolism without acute cor pulmonale: Secondary | ICD-10-CM | POA: Diagnosis not present

## 2021-11-29 DIAGNOSIS — I10 Essential (primary) hypertension: Secondary | ICD-10-CM

## 2021-11-29 DIAGNOSIS — Z86718 Personal history of other venous thrombosis and embolism: Secondary | ICD-10-CM | POA: Insufficient documentation

## 2021-11-29 DIAGNOSIS — M79606 Pain in leg, unspecified: Secondary | ICD-10-CM | POA: Insufficient documentation

## 2021-11-29 DIAGNOSIS — I5032 Chronic diastolic (congestive) heart failure: Secondary | ICD-10-CM | POA: Diagnosis not present

## 2021-11-29 DIAGNOSIS — R059 Cough, unspecified: Secondary | ICD-10-CM | POA: Diagnosis not present

## 2021-11-29 DIAGNOSIS — D696 Thrombocytopenia, unspecified: Secondary | ICD-10-CM | POA: Diagnosis not present

## 2021-11-29 DIAGNOSIS — F17210 Nicotine dependence, cigarettes, uncomplicated: Secondary | ICD-10-CM | POA: Diagnosis not present

## 2021-11-29 NOTE — Progress Notes (Deleted)
   Patient ID: Courtney Mack, female    DOB: 11/01/1955, 66 y.o.   MRN: [id number]  HPI  Courtney Mack is a 66 y/o female with a history of DM, hyperlipidemia, HTN, DVT, PE, anemia, thrombocytopenia, tobacco use and chronic heart failure.    Echo report from 09/12/21 reviewed and showed an EF of 55-60% along with mild LVH/ LAE & mild MR.    Admitted 11/16/21 due to recurrent gross hematuria and anemia. Urology and medical oncology consults obtained. Chest CT showed small right PE. Heparin drip started.  S/p cystoscopy with removal and replacement of left double-J stent. Discharged after 3 days. Admitted 10/16/21 due to c/o abdominal pain, generalized weakness, fevers and chills.Patient underwent left ureteroscopy on 4/12 which was negative for obstructive stone. Needed vasopressin support due to hypotension. Urology and palliative care consults obtained. Given PRBC's due to anemia. Antibiotics given due to sepsis. Initially needed oxygen but able to be weaned to room air. Xarelto held. Discharged after 7 days. Admitted 10/12/21 due to hemoglobin of 6.4. Two units of PRBC's given along with IV protonix. GI consult obtained. EGD completed. Discharged after 2 days.    She presents today for her initial visit with a chief complaint of   Review of Systems  Constitutional:  Positive for fatigue. Negative for appetite change.  HENT:  Negative for congestion, postnasal drip and sore throat.   Eyes: Negative.   Respiratory:  Positive for cough and shortness of breath (with moderate exertion). Negative for chest tightness.   Cardiovascular:  Positive for leg swelling (+ lymphedema). Negative for chest pain and palpitations.  Gastrointestinal:  Negative for abdominal distention and abdominal pain.  Endocrine: Negative.   Genitourinary: Negative.   Musculoskeletal:  Positive for arthralgias (leg) and back pain (at times).  Skin: Negative.   Allergic/Immunologic: Negative.   Neurological:  Negative for  dizziness and light-headedness.  Hematological:  Negative for adenopathy. Bruises/bleeds easily.  Psychiatric/Behavioral:  Negative for dysphoric mood and sleep disturbance (sleeping in recliner due to comfort and SOB). The patient is not nervous/anxious.      Physical Exam    Assessment & Plan:   1: Chronic heart failure with preserved ejection fraction with structural changes (LVH/LAE)- - NYHA class  - saw cardiology Rockey Situ) 10/04/21 - BNP 10/17/21 was 927.6   2: HTN- - BP looks good (114/54) - saw PCP at [facility] 11/26/21 reviewed and showed sodium 137, potassium 3.6, creatinine 1.08 and GFR 57   3: DM- - A1c 11/16/21 was 5.7%   4: Anemia- - hemoglobin 11/26/21 was 8.5 - saw hematology Tasia Catchings) 11/26/21

## 2021-11-29 NOTE — Progress Notes (Deleted)
        Patient ID: Courtney Mack, female    DOB: [birth year], 66 y.o.   MRN: [id number]  HPI  Ms Courtney Mack is a 66 y/o female with a history of DM, hyperlipidemia, HTN, DVT, PE, anemia, thrombocytopenia, tobacco use and chronic heart failure.   Echo report from 09/12/21 reviewed and showed an EF of 55-60% along with mild LVH/ LAE & mild MR.   Admitted 11/16/21 due to recurrent gross hematuria and anemia. Urology and medical oncology consults obtained. Chest CT showed small right PE. Heparin drip started.  S/p cystoscopy with removal and replacement of left double-J stent. Discharged after 3 days. Admitted 10/16/21 due to c/o abdominal pain, generalized weakness, fevers and chills.Patient underwent left ureteroscopy on 4/12 which was negative for obstructive stone. Needed vasopressin support due to hypotension. Urology and palliative care consults obtained. Given PRBC's due to anemia. Antibiotics given due to sepsis. Initially needed oxygen but able to be weaned to room air. Xarelto held. Discharged after 7 days. Admitted 10/12/21 due to hemoglobin of 6.4. Two units of PRBC's given along with IV protonix. GI consult obtained. EGD completed. Discharged after 2 days.   She presents today for her initial visit with a chief complaint of   Review of Systems    Physical Exam    Assessment & Plan:  1: Chronic heart failure with preserved ejection fraction with structural changes (LVH/LAE)- - NYHA class  - saw cardiology Courtney Mack) 10/04/21 - BNP 10/17/21 was 927.6  2: HTN- - BP looks good (114/54) - saw PCP at [place] 11/26/21 reviewed and showed sodium 137, potassium 3.6, creatinine 1.08 and GFR 57  3: DM- - A1c 11/16/21 was 5.7%  4: Anemia- - hemoglobin 11/26/21 was 8.5 - saw hematology Courtney Mack) 11/26/21

## 2021-11-29 NOTE — Patient Instructions (Signed)
Continue weighing daily and call for an overnight weight gain of 3 pounds or more or a weekly weight gain of more than 5 pounds.  °

## 2021-11-29 NOTE — Progress Notes (Signed)
Patient ID: Courtney Mack, female    DOB: 07-19-1955, 66 y.o.   MRN: [id number]  HPI  Ms Quirion is a 66 y/o female with a history of DM, hyperlipidemia, HTN, DVT, PE, anemia, thrombocytopenia, tobacco use and chronic heart failure.    Echo report from 09/12/21 reviewed and showed an EF of 55-60% along with mild LVH/ LAE & mild MR.    Admitted 11/16/21 due to recurrent gross hematuria and anemia. Urology and medical oncology consults obtained. Chest CT showed small right PE. Heparin drip started.  S/p cystoscopy with removal and replacement of left double-J stent. Discharged after 3 days. Admitted 10/16/21 due to c/o abdominal pain, generalized weakness, fevers and chills.Patient underwent left ureteroscopy on 4/12 which was negative for obstructive stone. Needed vasopressin support due to hypotension. Urology and palliative care consults obtained. Given PRBC's due to anemia. Antibiotics given due to sepsis. Initially needed oxygen but able to be weaned to room air. Xarelto held. Discharged after 7 days. Admitted 10/12/21 due to hemoglobin of 6.4. Two units of PRBC's given along with IV protonix. GI consult obtained. EGD completed. Discharged after 2 days.    She presents today for her initial visit with a chief complaint of minimal shortness of breath upon moderate exertion. Describes this as chronic in nature having been present for several years. She has associated fatigue, cough, pedal edema (due to lymphedema), easy bruising and leg pain along with this. She denies any difficulty sleeping, dizziness, abdominal distention, palpitations, chest pain, wheezing or weight gain.   Past Medical History:  Diagnosis Date   Acute bilateral deep vein thrombosis (DVT) of femoral veins ([place]) 01/02/2021   Acute massive pulmonary embolism ([place]) 08/25/2020   Last Assessment & Plan:  Formatting of this note might be different from the original. Post vena caval filter and on xarelto and O2   Anemia    ARF  (acute respiratory failure) ([place])    Cancer ([place])    endometrial   Cellulitis of left lower leg 06/20/2021   CHF (congestive heart failure) ([place])    COVID-19    Diabetes mellitus without complication ([place])    DVT (deep venous thrombosis) ([place]) 09/06/2020   High cholesterol    Hx of blood clots    Hyperlipidemia    Hypertension    IDA (iron deficiency anemia) 09/21/2020   Obesity    Pressure injury of skin 06/21/2021   Pulmonary embolism ([place])    Sepsis ([place])    Thrombocytopenia ([place])    Urinary tract infection 09/13/2021   Past Surgical History:  Procedure Laterality Date   COLONOSCOPY N/A 06/21/2021   Procedure: COLONOSCOPY;  Surgeon: Toledo, Benay Pike, MD;  Location: [facility] ENDOSCOPY;  Service: Gastroenterology;  Laterality: N/A;   CYSTOSCOPY W/ URETERAL STENT PLACEMENT Left 09/11/2021   Procedure: CYSTOSCOPY WITH RETROGRADE PYELOGRAM/URETERAL STENT PLACEMENT;  Surgeon: Abbie Sons, MD;  Location: [facility] ORS;  Service: Urology;  Laterality: Left;   CYSTOSCOPY W/ URETERAL STENT PLACEMENT Left 11/17/2021   Procedure: CYSTOSCOPY WITH STENT REPLACEMENT;  Surgeon: Irine Seal, MD;  Location: [facility] ORS;  Service: Urology;  Laterality: Left;   CYSTOSCOPY WITH FULGERATION  11/17/2021   Procedure: CYSTOSCOPY WITH FULGERATION;  Surgeon: Irine Seal, MD;  Location: [facility] ORS;  Service: Urology;;   CYSTOSCOPY WITH STENT PLACEMENT Left 10/17/2021   Procedure: CYSTOSCOPY WITH STENT PLACEMENT;  Surgeon: Abbie Sons, MD;  Location: [facility] ORS;  Service: Urology;  Laterality: Left;   CYSTOSCOPY WITH URETEROSCOPY, STONE BASKETRY AND STENT PLACEMENT  Left 10/16/2021   Procedure: CYSTOSCOPY WITH URETEROSCOPY  AND STENT REMOVAL;  Surgeon: Abbie Sons, MD;  Location: [facility] ORS;  Service: Urology;  Laterality: Left;   ENTEROSCOPY N/A 08/17/2021   Procedure: ENTEROSCOPY;  Surgeon: Lin Landsman, MD;  Location: [facility] ENDOSCOPY;  Service: Gastroenterology;  Laterality: N/A;    ESOPHAGOGASTRODUODENOSCOPY N/A 06/21/2021   Procedure: ESOPHAGOGASTRODUODENOSCOPY (EGD);  Surgeon: Toledo, Benay Pike, MD;  Location: [facility] ENDOSCOPY;  Service: Gastroenterology;  Laterality: N/A;   ESOPHAGOGASTRODUODENOSCOPY (EGD) WITH PROPOFOL N/A 08/17/2021   Procedure: ESOPHAGOGASTRODUODENOSCOPY (EGD) WITH PROPOFOL;  Surgeon: Lin Landsman, MD;  Location: [facility] ENDOSCOPY;  Service: Gastroenterology;  Laterality: N/A;   ESOPHAGOGASTRODUODENOSCOPY (EGD) WITH PROPOFOL N/A 10/14/2021   Procedure: ESOPHAGOGASTRODUODENOSCOPY (EGD) WITH PROPOFOL;  Surgeon: [name], MD;  Location: [facility] ENDOSCOPY;  Service: Endoscopy;  Laterality: N/A;   GIVENS CAPSULE STUDY N/A 06/22/2021   Procedure: GIVENS CAPSULE STUDY;  Surgeon: Toledo, Benay Pike, MD;  Location: [facility] ENDOSCOPY;  Service: Gastroenterology;  Laterality: N/A;   IVC FILTER INSERTION N/A 08/18/2020   Procedure: IVC FILTER INSERTION;  Surgeon: Katha Cabal, MD;  Location: [place] CV LAB;  Service: Cardiovascular;  Laterality: N/A;   PERIPHERAL VASCULAR THROMBECTOMY Bilateral 01/03/2021   Procedure: PERIPHERAL VASCULAR THROMBECTOMY;  Surgeon: Algernon Huxley, MD;  Location: [place] CV [facility];  Service: Cardiovascular;  Laterality: Bilateral;   PULMONARY THROMBECTOMY N/A 08/18/2020   Procedure: PULMONARY THROMBECTOMY;  Surgeon: Katha Cabal, MD;  Location: [place] CV [facility];  Service: Cardiovascular;  Laterality: N/A;   TUBAL LIGATION     VISCERAL ANGIOGRAPHY N/A 07/18/2021   Procedure: VISCERAL ANGIOGRAPHY;  Surgeon: Algernon Huxley, MD;  Location: [place] [facility];  Service: Cardiovascular;  Laterality: N/A;   Family History  Problem Relation Age of Onset   Hypertension Mother    Diabetes Mother    Diabetes Father    Hypertension Father    High Cholesterol Father    Congestive Heart Failure Father    Breast cancer Cousin    Social History   Tobacco Use   Smoking status: Never   Smokeless tobacco: Never   Tobacco  comments:    smoked 2-4 cigarettes for about 2-3 weeks   Substance Use Topics   Alcohol use: Not Currently   No Known Allergies Prior to Admission medications   Medication Sig Start Date End Date Taking? Authorizing Provider  acetaminophen (TYLENOL) 500 MG tablet Take 500 mg by mouth every 6 (six) hours as needed for mild pain or fever.   Yes [provider]  Ascorbic Acid (VITAMIN C) 1000 MG tablet Take 1,000 mg by mouth daily.   Yes [provider]  BREO ELLIPTA 100-25 MCG/ACT AEPB Inhale 1 puff into the lungs daily. 11/15/21  Yes [provider]  Ferrous Sulfate (IRON) 325 (65 Fe) MG TABS Take 325 mg by mouth every other day. 07/05/21  Yes [provider]  furosemide (LASIX) 20 MG tablet Take 1 tablet (20 mg total) by mouth daily. Patient taking differently: Take 20 mg by mouth every morning. 09/18/21 09/18/22 Yes Annita Brod, MD  loperamide (IMODIUM) 2 MG capsule Take 1 capsule (2 mg total) by mouth as needed for diarrhea or loose stools. 06/26/21  Yes Sreenath, Sudheer B, MD  mometasone-formoterol (DULERA) 100-5 MCG/ACT AERO Inhale 2 puffs into the lungs 2 (two) times daily.   Yes [provider]  Multiple Vitamin (MULTIVITAMIN WITH MINERALS) TABS tablet Take 1 tablet by mouth daily. 09/12/20  Yes Murray Hodgkins  P, MD  pantoprazole (PROTONIX) 40 MG tablet Take 40 mg by mouth every morning.   Yes [provider]  rivaroxaban (XARELTO) 10 MG TABS tablet Take 1 tablet (10 mg total) by mouth daily. 11/26/21 12/26/21 Yes Earlie Server, MD  rosuvastatin (CRESTOR) 10 MG tablet Take 1 tablet (10 mg total) by mouth daily. Patient taking differently: Take 10 mg by mouth every morning. 10/03/20  Yes Earlie Server, MD   Review of Systems  Constitutional:  Positive for fatigue. Negative for appetite change.  HENT:  Negative for congestion, postnasal drip and sore throat.   Eyes: Negative.   Respiratory:  Positive for cough (ever since had PE's) and  shortness of breath (with moderate exertion).   Cardiovascular:  Positive for leg swelling (+ lymphedema). Negative for chest pain and palpitations.  Gastrointestinal:  Negative for abdominal distention and abdominal pain.  Endocrine: Negative.   Genitourinary: Negative.   Musculoskeletal:  Positive for arthralgias (legs) and back pain (at times).  Skin: Negative.   Allergic/Immunologic: Negative.   Neurological:  Negative for dizziness and light-headedness.  Hematological:  Negative for adenopathy. Bruises/bleeds easily.  Psychiatric/Behavioral:  Negative for dysphoric mood and sleep disturbance (sleeps in reclincer due to comfort and SOB). The patient is not nervous/anxious.    Vitals:   11/29/21 1303  BP: (!) 114/54  Pulse: 87  Resp: 18  SpO2: 97%  Weight: 173 lb (78.5 kg)  Height: '4\' 11"'$  (1.499 m)   Wt Readings from Last 3 Encounters:  11/29/21 173 lb (78.5 kg)  11/26/21 182 lb 4.8 oz (82.7 kg)  11/19/21 186 lb 8.2 oz (84.6 kg)   Lab Results  Component Value Date   CREATININE 1.08 (H) 11/26/2021   CREATININE 0.81 11/19/2021   CREATININE 0.74 11/18/2021   Physical Exam Vitals and nursing note reviewed. Exam conducted with a chaperone present (daughter).  Constitutional:      Appearance: Normal appearance.  HENT:     Head: Normocephalic and atraumatic.  Cardiovascular:     Rate and Rhythm: Normal rate and regular rhythm.  Pulmonary:     Effort: Pulmonary effort is normal. No respiratory distress.     Breath sounds: No wheezing or rales.  Abdominal:     General: There is no distension.     Palpations: Abdomen is soft.  Musculoskeletal:        General: No tenderness.     Cervical back: Normal range of motion and neck supple.     Right lower leg: Edema (+ lymphedema) present.     Left lower leg: Edema (+ lymphedema) present.  Skin:    General: Skin is warm and dry.  Neurological:     General: No focal deficit present.     Mental Status: She is alert and oriented  to person, place, and time.  Psychiatric:        Behavior: Behavior normal.        Thought Content: Thought content normal.    Assessment & Plan:   1: Chronic heart failure with preserved ejection fraction with structural changes (LVH/LAE)- - NYHA class II - euvolemic today - weighing daily; reminded to call for an overnight weight gain of > 2 pounds or a weekly weight gain of > 5 pounds - not adding salt except on rare occasions; daughter is looking at food labels and discussed keeping daily sodium intake to '2000mg'$  - saw cardiology Rockey Situ) 10/04/21 - current BP may not tolerate entresto or SGLT2 - BNP 10/17/21 was 927.6  2: HTN- - BP looks good (114/54) - saw PCP at [facility] yesterday - BMP 11/26/21 reviewed and showed sodium 137, potassium 3.6, creatinine 1.08 and GFR 57   3: DM- - A1c 11/16/21 was 5.7%   4: Anemia- - hemoglobin 11/26/21 was 8.5 - saw hematology Tasia Catchings) 11/26/21  5: Lymphedema- - stage 2 - limited in her ability to exercise due to leg pain - unable to wear compression socks - does elevate her legs in a recliner but swelling never completely resolves - consider compression boots or lymphedema clinic   Patient did not bring her medications nor a list. Each medication was verbally reviewed with the patient and she was encouraged to bring the bottles to every visit to confirm accuracy of list.   Return in 6 weeks, sooner if needed.

## 2021-11-30 ENCOUNTER — Encounter: Payer: Self-pay | Admitting: Family

## 2021-12-04 ENCOUNTER — Encounter: Payer: Self-pay | Admitting: Oncology

## 2021-12-04 ENCOUNTER — Telehealth: Payer: Self-pay

## 2021-12-04 NOTE — Telephone Encounter (Signed)
Called and informed patient of lab results and Dr. Collie Siad recommendation. Patient verbalized understanding.    Please schedule patient for:  IV venofer weekly x 3. (Prefers 1pm) Please inform patient of appt.

## 2021-12-04 NOTE — Telephone Encounter (Signed)
-----   Message from Earlie Server, MD sent at 12/04/2021 12:45 PM EDT ----- Iron level is low.  Please schedule patient to have additional IV Venofer weekly x3.  Keep current follow-up appointment please.  Thank you

## 2021-12-12 ENCOUNTER — Ambulatory Visit: Payer: Medicare (Managed Care) | Admitting: Urology

## 2021-12-13 ENCOUNTER — Inpatient Hospital Stay: Payer: Medicare (Managed Care) | Attending: Nurse Practitioner

## 2021-12-13 ENCOUNTER — Ambulatory Visit: Payer: Medicare (Managed Care) | Admitting: Urology

## 2021-12-13 VITALS — BP 95/53 | HR 84 | Temp 96.6°F | Resp 18

## 2021-12-13 DIAGNOSIS — D509 Iron deficiency anemia, unspecified: Secondary | ICD-10-CM | POA: Diagnosis present

## 2021-12-13 DIAGNOSIS — D500 Iron deficiency anemia secondary to blood loss (chronic): Secondary | ICD-10-CM

## 2021-12-13 MED ORDER — SODIUM CHLORIDE 0.9 % IV SOLN
INTRAVENOUS | Status: DC | PRN
  Filled 2021-12-13: qty 250

## 2021-12-13 MED ORDER — IRON SUCROSE 20 MG/ML IV SOLN
200.0000 mg | Freq: Once | INTRAVENOUS | Status: AC
  Administered 2021-12-13: 200 mg via INTRAVENOUS
  Filled 2021-12-13: qty 10

## 2021-12-13 MED ORDER — SODIUM CHLORIDE 0.9 % IV SOLN: 200.0000 mg | Freq: Once | INTRAVENOUS | Status: DC

## 2021-12-13 NOTE — Patient Instructions (Signed)

## 2021-12-14 ENCOUNTER — Encounter: Payer: Self-pay | Admitting: Urology

## 2021-12-14 ENCOUNTER — Ambulatory Visit (INDEPENDENT_AMBULATORY_CARE_PROVIDER_SITE_OTHER): Payer: Medicare (Managed Care) | Admitting: Urology

## 2021-12-14 VITALS — BP 106/68 | HR 90 | Ht 59.0 in | Wt 186.0 lb

## 2021-12-14 DIAGNOSIS — N1339 Other hydronephrosis: Secondary | ICD-10-CM | POA: Diagnosis not present

## 2021-12-14 DIAGNOSIS — R319 Hematuria, unspecified: Secondary | ICD-10-CM

## 2021-12-14 DIAGNOSIS — A419 Sepsis, unspecified organism: Secondary | ICD-10-CM

## 2021-12-14 DIAGNOSIS — N390 Urinary tract infection, site not specified: Secondary | ICD-10-CM

## 2021-12-14 LAB — URINALYSIS, COMPLETE
Bilirubin, UA: NEGATIVE
Glucose, UA: NEGATIVE
Ketones, UA: NEGATIVE
Nitrite, UA: POSITIVE — AB
Protein,UA: NEGATIVE
Specific Gravity, UA: 1.015 (ref 1.005–1.030)
Urobilinogen, Ur: 0.2 mg/dL (ref 0.2–1.0)
pH, UA: 6 (ref 5.0–7.5)

## 2021-12-14 LAB — MICROSCOPIC EXAMINATION: WBC, UA: >30 /hpf — AB (ref 0–5)

## 2021-12-14 NOTE — Progress Notes (Unsigned)
12/14/2021 9:42 AM   Courtney Mack Feb 03, 1956 [id number]  Referring provider: [facility] [address] [place],  [place] [address]  Chief Complaint  Patient presents with   Hematuria    HPI: 66 y.o. female presents for follow-up visit.  She presents today with her daughter  History of septic shock secondary to a 2 mm left distal ureteral calculus March 2023 requiring emergent stent placement and extended ICU stay Follow-up ureteroscopy 10/17/2021 negative for stone.  Retrograde pyelogram showed no evidence of obstruction and stent was removed She presented early the next morning with recurrent flank pain, septic shock.  CT showed no evidence of stone though there was mild hydronephrosis and stent was replaced.  Urine/blood cultures were negative Based on body habitus and inability to flex knees and abduct hips positioning is very difficult for this patient Mild dysuria since discharge but otherwise no complaints  Seen in follow-up 11/14/2021 and after discussing options it was elected to keep her stent indwelling with periodic changes ED visit 11/16/2021 with gross hematuria and UTI.  CT showed mild hydro and Dr. Jeffie Pollock elected to change her stent Was having intermittent hematuria which her daughter states has resolved and states she is currently doing well   PMH: Past Medical History:  Diagnosis Date   Acute bilateral deep vein thrombosis (DVT) of femoral veins ([place]) 01/02/2021   Acute massive pulmonary embolism ([place]) 08/25/2020   Last Assessment & Plan:  Formatting of this note might be different from the original. Post vena caval filter and on xarelto and O2   Anemia    ARF (acute respiratory failure) ([place])    Cancer ([place])    endometrial   Cellulitis of left lower leg 06/20/2021   CHF (congestive heart failure) ([place])    COVID-19    Diabetes mellitus without complication ([place])    DVT (deep venous thrombosis) ([place]) 09/06/2020    High cholesterol    Hx of blood clots    Hyperlipidemia    Hypertension    IDA (iron deficiency anemia) 09/21/2020   Obesity    Pressure injury of skin 06/21/2021   Pulmonary embolism ([place])    Sepsis ([place])    Thrombocytopenia ([place])    Urinary tract infection 09/13/2021    Surgical History: Past Surgical History:  Procedure Laterality Date   COLONOSCOPY N/A 06/21/2021   Procedure: COLONOSCOPY;  Surgeon: Toledo, Benay Pike, MD;  Location: [facility] ENDOSCOPY;  Service: Gastroenterology;  Laterality: N/A;   CYSTOSCOPY W/ URETERAL STENT PLACEMENT Left 09/11/2021   Procedure: CYSTOSCOPY WITH RETROGRADE PYELOGRAM/URETERAL STENT PLACEMENT;  Surgeon: Abbie Sons, MD;  Location: [facility] ORS;  Service: Urology;  Laterality: Left;   CYSTOSCOPY W/ URETERAL STENT PLACEMENT Left 11/17/2021   Procedure: CYSTOSCOPY WITH STENT REPLACEMENT;  Surgeon: Irine Seal, MD;  Location: [facility];  Service: Urology;  Laterality: Left;   CYSTOSCOPY WITH FULGERATION  11/17/2021   Procedure: CYSTOSCOPY WITH FULGERATION;  Surgeon: Irine Seal, MD;  Location: [facility];  Service: Urology;;   CYSTOSCOPY WITH STENT PLACEMENT Left 10/17/2021   Procedure: CYSTOSCOPY WITH STENT PLACEMENT;  Surgeon: Abbie Sons, MD;  Location: [facility];  Service: Urology;  Laterality: Left;   CYSTOSCOPY WITH URETEROSCOPY, STONE BASKETRY AND STENT PLACEMENT Left 10/16/2021   Procedure: CYSTOSCOPY WITH URETEROSCOPY  AND STENT REMOVAL;  Surgeon: Abbie Sons, MD;  Location: [facility];  Service: Urology;  Laterality: Left;   ENTEROSCOPY N/A 08/17/2021   Procedure: ENTEROSCOPY;  Surgeon: Lin Landsman, MD;  Location: [facility];  Service: Gastroenterology;  Laterality: N/A;   ESOPHAGOGASTRODUODENOSCOPY N/A 06/21/2021   Procedure: ESOPHAGOGASTRODUODENOSCOPY (EGD);  Surgeon: Toledo, Benay Pike, MD;  Location: [facility];  Service: Gastroenterology;  Laterality: N/A;   ESOPHAGOGASTRODUODENOSCOPY (EGD) WITH PROPOFOL N/A 08/17/2021    Procedure: ESOPHAGOGASTRODUODENOSCOPY (EGD) WITH PROPOFOL;  Surgeon: Lin Landsman, MD;  Location[facility];  Service: Gastroenterology;  Laterality: N/A;   ESOPHAGOGASTRODUODENOSCOPY (EGD) WITH PROPOFOL N/A 10/14/2021   Procedure: ESOPHAGOGASTRODUODENOSCOPY (EGD) WITH PROPOFOL;  Surgeon: [name], MD;  Location: [facility] ENDOSCOPY;  Service: Endoscopy;  Laterality: N/A;   GIVENS CAPSULE STUDY N/A 06/22/2021   Procedure: GIVENS CAPSULE STUDY;  Surgeon: Toledo, Benay Pike, MD;  Location: [facility] ENDOSCOPY;  Service: Gastroenterology;  Laterality: N/A;   IVC FILTER INSERTION N/A 08/18/2020   Procedure: IVC FILTER INSERTION;  Surgeon: Katha Cabal, MD;  Location: [place] [facility];  Service: Cardiovascular;  Laterality: N/A;   PERIPHERAL VASCULAR THROMBECTOMY Bilateral 01/03/2021   Procedure: PERIPHERAL VASCULAR THROMBECTOMY;  Surgeon: Algernon Huxley, MD;  Location: [place] [facility];  Service: Cardiovascular;  Laterality: Bilateral;   PULMONARY THROMBECTOMY N/A 08/18/2020   Procedure: PULMONARY THROMBECTOMY;  Surgeon: Katha Cabal, MD;  Location: [place] [facility];  Service: Cardiovascular;  Laterality: N/A;   TUBAL LIGATION     VISCERAL ANGIOGRAPHY N/A 07/18/2021   Procedure: VISCERAL ANGIOGRAPHY;  Surgeon: Algernon Huxley, MD;  Location: [place] [facility];  Service: Cardiovascular;  Laterality: N/A;    Home Medications:  Allergies as of 12/14/2021   No Known Allergies      Medication List        Accurate as of December 14, 2021  9:42 AM. If you have any questions, ask your nurse or doctor.          acetaminophen 500 MG tablet Commonly known as: TYLENOL Take 500 mg by mouth every 6 (six) hours as needed for mild pain or fever.   Breo Ellipta 100-25 MCG/ACT Aepb Generic drug: fluticasone furoate-vilanterol Inhale 1 puff into the lungs daily.   furosemide 20 MG tablet Commonly known as: Lasix Take 1 tablet (20 mg total) by mouth daily. What changed: when to take  this   Iron 325 (65 Fe) MG Tabs Take 325 mg by mouth every other day.   loperamide 2 MG capsule Commonly known as: IMODIUM Take 1 capsule (2 mg total) by mouth as needed for diarrhea or loose stools.   mometasone-formoterol 100-5 MCG/ACT Aero Commonly known as: DULERA Inhale 2 puffs into the lungs 2 (two) times daily.   multivitamin with minerals Tabs tablet Take 1 tablet by mouth daily.   pantoprazole 40 MG tablet Commonly known as: PROTONIX Take 40 mg by mouth every morning.   rivaroxaban 10 MG Tabs tablet Commonly known as: XARELTO Take 1 tablet (10 mg total) by mouth daily.   rosuvastatin 10 MG tablet Commonly known as: CRESTOR Take 1 tablet (10 mg total) by mouth daily. What changed: when to take this   vitamin C 1000 MG tablet Take 1,000 mg by mouth daily.        Allergies: No Known Allergies  Family History: Family History  Problem Relation Age of Onset   Hypertension Mother    Diabetes Mother    Diabetes Father    Hypertension Father    High Cholesterol Father    Congestive Heart Failure Father    Breast cancer Cousin     Social History:  reports that she has never smoked. She  has never used smokeless tobacco. She reports that she does not currently use alcohol. She reports that she does not currently use drugs.   Physical Exam: BP 106/68   Pulse 90   Ht '4\' 11"'$  (1.499 m)   Wt 186 lb (84.4 kg)   BMI 37.57 kg/m   Constitutional:  Alert and oriented, No acute distress. Psychiatric: Normal mood and affect.   Assessment & Plan:    1.  Left hydronephrosis with recurrent sepsis As we discussed at last visit would recommend either percutaneous nephrostomy versus chronic indwelling stent and they elected the latter. Will tentatively schedule stent exchange in 3 months  ***Order to Benard Halsted, [place] [address], [place], [place] [address] [phone]

## 2021-12-17 ENCOUNTER — Other Ambulatory Visit: Payer: Self-pay | Admitting: Urology

## 2021-12-17 ENCOUNTER — Ambulatory Visit: Payer: Medicare (Managed Care) | Admitting: Urology

## 2021-12-17 DIAGNOSIS — N1330 Unspecified hydronephrosis: Secondary | ICD-10-CM

## 2021-12-17 NOTE — Progress Notes (Signed)
Surgical Physician Order Form [facility] [place]  * Scheduling expectation :  Early to mid August 2023.  Daughter request early time on OR schedule  *Length of Case: 30 minutes  *Clearance needed: yes-hold Xarelto  *Anticoagulation Instructions: Hold all anticoagulants  *Aspirin Instructions: N/A  *Post-op visit Date/Instructions:  1 month follow up with PA  *Diagnosis: Left hydronephrosis with recurrent sepsis  *Procedure: Left  ureteral stent exchange   Additional orders: N/A  -Admit type: OUTpatient  -Anesthesia: Choice  -VTE Prophylaxis Standing Order SCD's       Other:   -Standing Lab Orders Per Anesthesia    Lab other: UA&Urine Culture  -Standing Test orders EKG/Chest x-ray per Anesthesia       Test other:   - Medications:   Based on preop urine culture  -Other orders:  N/A

## 2021-12-20 ENCOUNTER — Inpatient Hospital Stay: Payer: Medicare (Managed Care)

## 2021-12-20 VITALS — BP 102/68 | HR 90 | Temp 98.0°F | Resp 16

## 2021-12-20 DIAGNOSIS — D500 Iron deficiency anemia secondary to blood loss (chronic): Secondary | ICD-10-CM

## 2021-12-20 DIAGNOSIS — D509 Iron deficiency anemia, unspecified: Secondary | ICD-10-CM | POA: Diagnosis not present

## 2021-12-20 MED ORDER — IRON SUCROSE 20 MG/ML IV SOLN
200.0000 mg | Freq: Once | INTRAVENOUS | Status: AC
  Administered 2021-12-20: 200 mg via INTRAVENOUS
  Filled 2021-12-20: qty 10

## 2021-12-20 MED ORDER — SODIUM CHLORIDE 0.9 % IV SOLN: 200.0000 mg | Freq: Once | INTRAVENOUS | Status: DC

## 2021-12-20 MED ORDER — SODIUM CHLORIDE 0.9 % IV SOLN
INTRAVENOUS | Status: DC
  Filled 2021-12-20: qty 250

## 2021-12-20 NOTE — Patient Instructions (Signed)

## 2021-12-24 ENCOUNTER — Emergency Department: Payer: Medicare (Managed Care)

## 2021-12-24 ENCOUNTER — Telehealth: Payer: Self-pay

## 2021-12-24 ENCOUNTER — Inpatient Hospital Stay
Admission: EM | Admit: 2021-12-24 | Discharge: 2021-12-31 | DRG: 699 | Disposition: A | Discharge status: Home Health | Payer: Medicare (Managed Care) | Attending: Hospitalist | Admitting: Hospitalist

## 2021-12-24 ENCOUNTER — Other Ambulatory Visit: Payer: Self-pay

## 2021-12-24 DIAGNOSIS — N342 Other urethritis: Secondary | ICD-10-CM

## 2021-12-24 DIAGNOSIS — R310 Gross hematuria: Secondary | ICD-10-CM | POA: Diagnosis present

## 2021-12-24 DIAGNOSIS — E785 Hyperlipidemia, unspecified: Secondary | ICD-10-CM | POA: Diagnosis present

## 2021-12-24 DIAGNOSIS — I10 Essential (primary) hypertension: Secondary | ICD-10-CM

## 2021-12-24 DIAGNOSIS — R319 Hematuria, unspecified: Secondary | ICD-10-CM | POA: Diagnosis present

## 2021-12-24 DIAGNOSIS — B952 Enterococcus as the cause of diseases classified elsewhere: Secondary | ICD-10-CM | POA: Diagnosis present

## 2021-12-24 DIAGNOSIS — Z7951 Long term (current) use of inhaled steroids: Secondary | ICD-10-CM

## 2021-12-24 DIAGNOSIS — D509 Iron deficiency anemia, unspecified: Secondary | ICD-10-CM | POA: Diagnosis present

## 2021-12-24 DIAGNOSIS — Z6836 Body mass index (BMI) 36.0-36.9, adult: Secondary | ICD-10-CM

## 2021-12-24 DIAGNOSIS — E66813 Obesity, class 3: Secondary | ICD-10-CM | POA: Diagnosis present

## 2021-12-24 DIAGNOSIS — K31819 Angiodysplasia of stomach and duodenum without bleeding: Secondary | ICD-10-CM | POA: Diagnosis present

## 2021-12-24 DIAGNOSIS — N3592 Unspecified urethral stricture, female: Secondary | ICD-10-CM | POA: Diagnosis present

## 2021-12-24 DIAGNOSIS — N136 Pyonephrosis: Secondary | ICD-10-CM | POA: Diagnosis present

## 2021-12-24 DIAGNOSIS — Z833 Family history of diabetes mellitus: Secondary | ICD-10-CM

## 2021-12-24 DIAGNOSIS — C541 Malignant neoplasm of endometrium: Secondary | ICD-10-CM | POA: Diagnosis present

## 2021-12-24 DIAGNOSIS — Y732 Prosthetic and other implants, materials and accessory gastroenterology and urology devices associated with adverse incidents: Secondary | ICD-10-CM | POA: Diagnosis present

## 2021-12-24 DIAGNOSIS — Z803 Family history of malignant neoplasm of breast: Secondary | ICD-10-CM

## 2021-12-24 DIAGNOSIS — Z86711 Personal history of pulmonary embolism: Secondary | ICD-10-CM | POA: Diagnosis present

## 2021-12-24 DIAGNOSIS — Z923 Personal history of irradiation: Secondary | ICD-10-CM

## 2021-12-24 DIAGNOSIS — Z7901 Long term (current) use of anticoagulants: Secondary | ICD-10-CM

## 2021-12-24 DIAGNOSIS — I890 Lymphedema, not elsewhere classified: Secondary | ICD-10-CM | POA: Diagnosis present

## 2021-12-24 DIAGNOSIS — E7800 Pure hypercholesterolemia, unspecified: Secondary | ICD-10-CM | POA: Diagnosis present

## 2021-12-24 DIAGNOSIS — E782 Mixed hyperlipidemia: Secondary | ICD-10-CM

## 2021-12-24 DIAGNOSIS — Z86718 Personal history of other venous thrombosis and embolism: Secondary | ICD-10-CM

## 2021-12-24 DIAGNOSIS — Z87440 Personal history of urinary (tract) infections: Secondary | ICD-10-CM

## 2021-12-24 DIAGNOSIS — N1330 Unspecified hydronephrosis: Secondary | ICD-10-CM

## 2021-12-24 DIAGNOSIS — Z9189 Other specified personal risk factors, not elsewhere classified: Secondary | ICD-10-CM

## 2021-12-24 DIAGNOSIS — N390 Urinary tract infection, site not specified: Secondary | ICD-10-CM | POA: Diagnosis present

## 2021-12-24 DIAGNOSIS — D62 Acute posthemorrhagic anemia: Secondary | ICD-10-CM | POA: Diagnosis present

## 2021-12-24 DIAGNOSIS — T8383XA Hemorrhage of genitourinary prosthetic devices, implants and grafts, initial encounter: Secondary | ICD-10-CM | POA: Diagnosis not present

## 2021-12-24 DIAGNOSIS — Z8719 Personal history of other diseases of the digestive system: Secondary | ICD-10-CM

## 2021-12-24 DIAGNOSIS — D500 Iron deficiency anemia secondary to blood loss (chronic): Secondary | ICD-10-CM

## 2021-12-24 DIAGNOSIS — Z79899 Other long term (current) drug therapy: Secondary | ICD-10-CM

## 2021-12-24 DIAGNOSIS — Z87442 Personal history of urinary calculi: Secondary | ICD-10-CM

## 2021-12-24 DIAGNOSIS — I509 Heart failure, unspecified: Secondary | ICD-10-CM | POA: Diagnosis present

## 2021-12-24 DIAGNOSIS — R54 Age-related physical debility: Secondary | ICD-10-CM | POA: Diagnosis present

## 2021-12-24 DIAGNOSIS — Z8249 Family history of ischemic heart disease and other diseases of the circulatory system: Secondary | ICD-10-CM

## 2021-12-24 DIAGNOSIS — Z1611 Resistance to penicillins: Secondary | ICD-10-CM | POA: Diagnosis present

## 2021-12-24 DIAGNOSIS — Z95828 Presence of other vascular implants and grafts: Secondary | ICD-10-CM

## 2021-12-24 DIAGNOSIS — I110 Hypertensive heart disease with heart failure: Secondary | ICD-10-CM | POA: Diagnosis present

## 2021-12-24 DIAGNOSIS — Z8349 Family history of other endocrine, nutritional and metabolic diseases: Secondary | ICD-10-CM

## 2021-12-24 LAB — COMPREHENSIVE METABOLIC PANEL
ALT: 8 U/L (ref 0–44)
AST: 16 U/L (ref 15–41)
Albumin: 3.3 g/dL — ABNORMAL LOW (ref 3.5–5.0)
Alkaline Phosphatase: 45 U/L (ref 38–126)
Anion gap: 6 (ref 5–15)
BUN: 18 mg/dL (ref 8–23)
CO2: 27 mmol/L (ref 22–32)
Calcium: 9 mg/dL (ref 8.9–10.3)
Chloride: 104 mmol/L (ref 98–111)
Creatinine, Ser: 0.73 mg/dL (ref 0.44–1.00)
GFR, Estimated: >60 mL/min (ref >60)
Glucose, Bld: 108 mg/dL — ABNORMAL HIGH (ref 70–99)
Potassium: 3.8 mmol/L (ref 3.5–5.1)
Sodium: 137 mmol/L (ref 135–145)
Total Bilirubin: 1.3 mg/dL — ABNORMAL HIGH (ref 0.3–1.2)
Total Protein: 7.3 g/dL (ref 6.5–8.1)

## 2021-12-24 LAB — CBC WITH DIFFERENTIAL/PLATELET
Abs Immature Granulocytes: 0.01 10*3/uL (ref 0.00–0.07)
Basophils Absolute: 0 10*3/uL (ref 0.0–0.1)
Basophils Relative: 1 %
Eosinophils Absolute: 0.2 10*3/uL (ref 0.0–0.5)
Eosinophils Relative: 4 %
HCT: 27.7 % — ABNORMAL LOW (ref 36.0–46.0)
Hemoglobin: 8 g/dL — ABNORMAL LOW (ref 12.0–15.0)
Immature Granulocytes: 0 %
Lymphocytes Relative: 20 %
Lymphs Abs: 0.8 10*3/uL (ref 0.7–4.0)
MCH: 27.5 pg (ref 26.0–34.0)
MCHC: 28.9 g/dL — ABNORMAL LOW (ref 30.0–36.0)
MCV: 95.2 fL (ref 80.0–100.0)
Monocytes Absolute: 0.2 10*3/uL (ref 0.1–1.0)
Monocytes Relative: 6 %
Neutro Abs: 2.6 10*3/uL (ref 1.7–7.7)
Neutrophils Relative %: 69 %
Platelets: 249 10*3/uL (ref 150–400)
RBC: 2.91 MIL/uL — ABNORMAL LOW (ref 3.87–5.11)
RDW: 18.6 % — ABNORMAL HIGH (ref 11.5–15.5)
WBC: 3.8 10*3/uL — ABNORMAL LOW (ref 4.0–10.5)
nRBC: 0 % (ref 0.0–0.2)

## 2021-12-24 LAB — URINALYSIS, ROUTINE W REFLEX MICROSCOPIC
RBC / HPF: >50 RBC/hpf — ABNORMAL HIGH (ref 0–5)
Specific Gravity, Urine: 1.018 (ref 1.005–1.030)
Squamous Epithelial / HPF: NONE SEEN (ref 0–5)
WBC, UA: >50 WBC/hpf — ABNORMAL HIGH (ref 0–5)

## 2021-12-24 MED ORDER — MOMETASONE FURO-FORMOTEROL FUM 100-5 MCG/ACT IN AERO
2.0000 | INHALATION_SPRAY | Freq: Two times a day (BID) | RESPIRATORY_TRACT | Status: DC
Start: 2021-12-24 — End: 2021-12-31
  Administered 2021-12-25 – 2021-12-31 (×13): 2 via RESPIRATORY_TRACT
  Filled 2021-12-24: qty 8.8

## 2021-12-24 MED ORDER — IOHEXOL 300 MG/ML  SOLN
100.0000 mL | Freq: Once | INTRAMUSCULAR | Status: AC | PRN
  Administered 2021-12-24: 100 mL via INTRAVENOUS

## 2021-12-24 MED ORDER — ONDANSETRON HCL 4 MG PO TABS: 4.0000 mg | ORAL_TABLET | Freq: Four times a day (QID) | ORAL | Status: DC | PRN

## 2021-12-24 MED ORDER — ROSUVASTATIN CALCIUM 10 MG PO TABS
10.0000 mg | ORAL_TABLET | ORAL | Status: DC
  Administered 2021-12-25 – 2021-12-31 (×7): 10 mg via ORAL
  Filled 2021-12-24 (×7): qty 1

## 2021-12-24 MED ORDER — ADULT MULTIVITAMIN W/MINERALS CH
1.0000 | ORAL_TABLET | Freq: Every day | ORAL | Status: DC
  Administered 2021-12-25 – 2021-12-31 (×7): 1 via ORAL
  Filled 2021-12-24 (×7): qty 1

## 2021-12-24 MED ORDER — ACETAMINOPHEN 325 MG PO TABS
650.0000 mg | ORAL_TABLET | Freq: Four times a day (QID) | ORAL | Status: AC | PRN
  Administered 2021-12-27: 650 mg via ORAL
  Filled 2021-12-24: qty 2

## 2021-12-24 MED ORDER — SENNOSIDES-DOCUSATE SODIUM 8.6-50 MG PO TABS: 1.0000 | ORAL_TABLET | Freq: Every evening | ORAL | Status: DC | PRN

## 2021-12-24 MED ORDER — SODIUM CHLORIDE 0.9 % IV SOLN
1.0000 g | INTRAVENOUS | Status: DC
  Administered 2021-12-24 – 2021-12-25 (×2): 1 g via INTRAVENOUS
  Filled 2021-12-24 (×2): qty 10

## 2021-12-24 MED ORDER — ASCORBIC ACID 500 MG PO TABS
1000.0000 mg | ORAL_TABLET | Freq: Every day | ORAL | Status: DC
  Administered 2021-12-25 – 2021-12-31 (×7): 1000 mg via ORAL
  Filled 2021-12-24 (×7): qty 2

## 2021-12-24 MED ORDER — LOPERAMIDE HCL 2 MG PO CAPS
2.0000 mg | ORAL_CAPSULE | ORAL | Status: DC | PRN
  Administered 2021-12-29 – 2021-12-31 (×2): 2 mg via ORAL
  Filled 2021-12-24 (×2): qty 1

## 2021-12-24 MED ORDER — PANTOPRAZOLE SODIUM 40 MG PO TBEC
40.0000 mg | DELAYED_RELEASE_TABLET | ORAL | Status: DC
  Administered 2021-12-25 – 2021-12-31 (×7): 40 mg via ORAL
  Filled 2021-12-24 (×7): qty 1

## 2021-12-24 MED ORDER — SODIUM CHLORIDE 0.9 % IR SOLN
3000.0000 mL | Status: DC
  Administered 2021-12-25 – 2021-12-30 (×13): 3000 mL

## 2021-12-24 MED ORDER — FERROUS SULFATE 325 (65 FE) MG PO TABS
325.0000 mg | ORAL_TABLET | ORAL | Status: DC
  Administered 2021-12-25 – 2021-12-31 (×4): 325 mg via ORAL
  Filled 2021-12-24 (×4): qty 1

## 2021-12-24 MED ORDER — ACETAMINOPHEN 650 MG RE SUPP: 650.0000 mg | Freq: Four times a day (QID) | RECTAL | Status: AC | PRN

## 2021-12-24 MED ORDER — ONDANSETRON HCL 4 MG/2ML IJ SOLN: 4.0000 mg | Freq: Four times a day (QID) | INTRAMUSCULAR | Status: DC | PRN

## 2021-12-24 MED ORDER — HYDRALAZINE HCL 10 MG PO TABS: 10.0000 mg | ORAL_TABLET | Freq: Four times a day (QID) | ORAL | Status: AC | PRN

## 2021-12-24 NOTE — Assessment & Plan Note (Addendum)
-  after reviewing past records, pt did have hematuria in the past.  Pt has had bladder neck bleeding that appeared consistent with radiation cystitis s/p recent Fulguration.  Currently has a left stent, thought to be the cause of hematuria Plan: - Continue to hold Xarelto  --cont Foley with irrigation --monitor Hgb --stent removal as outpatient as soon as possible

## 2021-12-24 NOTE — ED Triage Notes (Signed)
Pt presents to ED with c/o of vaginal bleeding that started Saturday. Pt states HX of kidney stones and states she has kidney stents placed to help pass kidney stones.   Pt states HX of iron deficient anemia, pt states she does take a blood thinner for HX of clots but does report she stopped Saturday when she started bleeding. Pt does present pallor in color. Pt is A&Ox4.

## 2021-12-24 NOTE — Assessment & Plan Note (Addendum)
Recurrent DVT - Recurrent DVT and massive PE in February 2022 in June 2022, status post thrombectomy and IVC filter placement, on Xarelto - Xarelto has been stopped by patient since 12/22/2021 - Per chart review patient had [facility] IVC filter insertion on 08/18/2020 Plan: --cont to hold Xarelto

## 2021-12-24 NOTE — H&P (Signed)
History and Physical   Courtney Mack ZOX:[id number] DOB: 07/14/1955 DOA: 12/24/2021  PCP: [facility] [place]  Outpatient Specialists: Dr. Bernardo Heater, urology Patient coming from: Home via [facility]  I have personally briefly reviewed patient's old medical records in [place].  Chief Concern: Vaginal bleeding  HPI: [name] is a 66 year old female with history of iron deficiency anemia, chronic lymphedema bilaterally, history of PE on Xarelto, hypertension, endometrial cancer, gastric antral vascular ectasia, who presents to the emergency department for chief concerns of bleeding.  Initial vitals in the emergency department showed temperature of 98.4, respiration rate of 20, heart rate of 88, blood pressure 135/67, SPO2 of 97% on room air.  Serum sodium is 137, potassium 3.8, chloride of 104, bicarb 27, nonfasting blood glucose 108, BUN of 18, serum creatinine of 0.73, GFR greater than 60, WBC 3.8, hemoglobin 8.0, platelets of 249.  UA was ordered however leukocytes and nitrates were not able to be reported due to color interference.  ED treatment: None  She is aao to self, age, location, and current year.  She reports that since Saturday, 12/22/2021, patient has had what she thought was vaginal bleeding.  This has been persistent.  She stopped her Xarelto at that time.  She denies chest pain, abdominal pain, dysuria, diarrhea, nausea, vomiting.  She endorsed generalized weakness and shortness of breath with exertion since Saturday.  She denies any known fever, cough, chills.  She states that her daughter brought her to the hospital.  Social history: She lives with her daughter. She denies tobacco, etoh, recreational drug use. She is retired and formerly worked as a Regulatory affairs officer.  ROS: Constitutional: no weight change, no fever ENT/Mouth: no sore throat, no rhinorrhea Eyes: no eye pain, no vision changes Cardiovascular: no chest pain, no dyspnea,  no  edema, no palpitations Respiratory: no cough, no sputum, no wheezing Gastrointestinal: no nausea, no vomiting, no diarrhea, no constipation Genitourinary: no urinary incontinence, no dysuria, + hematuria Musculoskeletal: no arthralgias, no myalgias Skin: no skin lesions, no pruritus, Neuro: + weakness, no loss of consciousness, no syncope Psych: no anxiety, no depression, no decrease appetite Heme/Lymph: no bruising, no bleeding  ED Course: Discussed with emergency medicine provider, patient requiring hospitalization for chief concerns of hematuria.  Assessment/Plan  Principal Problem:   Hematuria Active Problems:   UTI (urinary tract infection)   Hypertension   Endometrial cancer (HCC)   IDA (iron deficiency anemia)   Gastric antral vascular ectasia   Morbid obesity ([place])   Morbidly obese (HCC)   Dyslipidemia   Risk factors for obstructive sleep apnea   HLD (hyperlipidemia)   History of pulmonary embolus (PE)   Assessment and Plan:  * Hematuria - Presumed secondary to Xarelto - Continue to hold Xarelto while inpatient - Three-way Foley ordered to allow for flushing, discussed with nursing - At this time, no clinical indication for urology consultation at night - AM team to consult urology if indicated - Strict I's and - Repeat CBC in the a.m.  UTI (urinary tract infection) Cystitis - Ceftriaxone 1 mg IV daily resumed for 5 doses ordered  Hypertension - Hydralazine 10 mg p.o. every 6 hours as needed for SBP greater than 175, 4 days ordered  History of pulmonary embolus (PE) - Recurrent DVT and massive PE in February 2022 in June 2022, status post thrombectomy and IVC filter placement, on Xarelto - Xarelto has been stopped by patient since 12/22/2021 - Per chart review patient had [facility] IVC filter  insertion on 08/18/2020 - I have not resumed Xarelto on admission  HLD (hyperlipidemia) - Resumed home rosuvastatin 10 mg q. daily  Dyslipidemia -  Rosuvastatin  Morbidly obese ([place]) - This meets criteria for morbid obesity based on the presence of 1 or more chronic comorbidities. Patient has hypertension and hyperlipidemia with BMI of 36.3. This complicates overall care and prognosis.   Chart reviewed.   Hospitalization from 11/16/2021 to 11/19/2021: Patient was admitted for gross hematuria.  Left urethral obstruction seen on CT per urology.  She had stent exchange.  Patient received 1 unit of PRBC for hemoglobin of 7.7.  Patient had cystoscopy with removal and replacement of left double-J stent.  Oncology saw the patient and recommended Xarelto 10 mg for anticoagulation considering bleeding risk.  DVT prophylaxis: IVC filter in place Code Status: full code Diet: Heart healthy Family Communication: No, a phone call was offered, patient declined. Disposition Plan: Pending clinical course Consults called: None at this time Admission status: Telemetry medical, observation  Past Medical History:  Diagnosis Date   Acute bilateral deep vein thrombosis (DVT) of femoral veins ([place]) 01/02/2021   Acute massive pulmonary embolism ([place]) 08/25/2020   Last Assessment & Plan:  Formatting of this note might be different from the original. Post vena caval filter and on xarelto and O2   Anemia    ARF (acute respiratory failure) ([place])    Cancer ([place])    endometrial   Cellulitis of left lower leg 06/20/2021   CHF (congestive heart failure) ([place])    COVID-19    Diabetes mellitus without complication ([place])    DVT (deep venous thrombosis) ([place]) 09/06/2020   High cholesterol    Hx of blood clots    Hyperlipidemia    Hypertension    IDA (iron deficiency anemia) 09/21/2020   Obesity    Pressure injury of skin 06/21/2021   Pulmonary embolism ([place])    Sepsis ([place])    Thrombocytopenia ([place])    Urinary tract infection 09/13/2021   Past Surgical History:  Procedure Laterality Date   COLONOSCOPY N/A 06/21/2021   Procedure: COLONOSCOPY;  Surgeon:  Toledo, Benay Pike, MD;  Location: [facility] ENDOSCOPY;  Service: Gastroenterology;  Laterality: N/A;   CYSTOSCOPY W/ URETERAL STENT PLACEMENT Left 09/11/2021   Procedure: CYSTOSCOPY WITH RETROGRADE PYELOGRAM/URETERAL STENT PLACEMENT;  Surgeon: Abbie Sons, MD;  Location: [facility] ORS;  Service: Urology;  Laterality: Left;   CYSTOSCOPY W/ URETERAL STENT PLACEMENT Left 11/17/2021   Procedure: CYSTOSCOPY WITH STENT REPLACEMENT;  Surgeon: Irine Seal, MD;  Location: [facility] ORS;  Service: Urology;  Laterality: Left;   CYSTOSCOPY WITH FULGERATION  11/17/2021   Procedure: CYSTOSCOPY WITH FULGERATION;  Surgeon: Irine Seal, MD;  Location: [facility] ORS;  Service: Urology;;   CYSTOSCOPY WITH STENT PLACEMENT Left 10/17/2021   Procedure: CYSTOSCOPY WITH STENT PLACEMENT;  Surgeon: Abbie Sons, MD;  Location: [facility] ORS;  Service: Urology;  Laterality: Left;   CYSTOSCOPY WITH URETEROSCOPY, STONE BASKETRY AND STENT PLACEMENT Left 10/16/2021   Procedure: CYSTOSCOPY WITH URETEROSCOPY  AND STENT REMOVAL;  Surgeon: Abbie Sons, MD;  Location: [facility] ORS;  Service: Urology;  Laterality: Left;   ENTEROSCOPY N/A 08/17/2021   Procedure: ENTEROSCOPY;  Surgeon: Lin Landsman, MD;  Location: [facility] ENDOSCOPY;  Service: Gastroenterology;  Laterality: N/A;   ESOPHAGOGASTRODUODENOSCOPY N/A 06/21/2021   Procedure: ESOPHAGOGASTRODUODENOSCOPY (EGD);  Surgeon: Toledo, Benay Pike, MD;  Location: [facility] ENDOSCOPY;  Service: Gastroenterology;  Laterality: N/A;   ESOPHAGOGASTRODUODENOSCOPY (EGD) WITH PROPOFOL N/A 08/17/2021   Procedure: ESOPHAGOGASTRODUODENOSCOPY (EGD)  WITH PROPOFOL;  Surgeon: Lin Landsman, MD;  Location: [facility] ENDOSCOPY;  Service: Gastroenterology;  Laterality: N/A;   ESOPHAGOGASTRODUODENOSCOPY (EGD) WITH PROPOFOL N/A 10/14/2021   Procedure: ESOPHAGOGASTRODUODENOSCOPY (EGD) WITH PROPOFOL;  Surgeon: [name], MD;  Location: [facility] ENDOSCOPY;  Service: Endoscopy;  Laterality: N/A;   GIVENS CAPSULE STUDY N/A 06/22/2021    Procedure: GIVENS CAPSULE STUDY;  Surgeon: Toledo, Benay Pike, MD;  Location: [facility] ENDOSCOPY;  Service: Gastroenterology;  Laterality: N/A;   IVC FILTER INSERTION N/A 08/18/2020   Procedure: IVC FILTER INSERTION;  Surgeon: Katha Cabal, MD;  Location: [place] CV LAB;  Service: Cardiovascular;  Laterality: N/A;   PERIPHERAL VASCULAR THROMBECTOMY Bilateral 01/03/2021   Procedure: PERIPHERAL VASCULAR THROMBECTOMY;  Surgeon: Algernon Huxley, MD;  Location: [place] CV LAB;  Service: Cardiovascular;  Laterality: Bilateral;   PULMONARY THROMBECTOMY N/A 08/18/2020   Procedure: PULMONARY THROMBECTOMY;  Surgeon: Katha Cabal, MD;  Location: [place] [facility] LAB;  Service: Cardiovascular;  Laterality: N/A;   TUBAL LIGATION     VISCERAL ANGIOGRAPHY N/A 07/18/2021   Procedure: VISCERAL ANGIOGRAPHY;  Surgeon: Algernon Huxley, MD;  Location: [place] CV LAB;  Service: Cardiovascular;  Laterality: N/A;   Social History:  reports that she has never smoked. She has never used smokeless tobacco. She reports that she does not currently use alcohol. She reports that she does not currently use drugs.  No Known Allergies Family History  Problem Relation Age of Onset   Hypertension Mother    Diabetes Mother    Diabetes Father    Hypertension Father    High Cholesterol Father    Congestive Heart Failure Father    Breast cancer Cousin    Family history: Family history reviewed and not pertinent.  Prior to Admission medications   Medication Sig Start Date End Date Taking? Authorizing Provider  acetaminophen (TYLENOL) 500 MG tablet Take 500 mg by mouth every 6 (six) hours as needed for mild pain or fever.    [provider]  Ascorbic Acid (VITAMIN C) 1000 MG tablet Take 1,000 mg by mouth daily.    [provider]  BREO ELLIPTA 100-25 MCG/ACT AEPB Inhale 1 puff into the lungs daily. 11/15/21   [provider]  Ferrous Sulfate (IRON) 325 (65 Fe) MG TABS Take 325 mg by  mouth every other day. 07/05/21   [provider]  furosemide (LASIX) 20 MG tablet Take 1 tablet (20 mg total) by mouth daily. Patient taking differently: Take 20 mg by mouth every morning. 09/18/21 09/18/22  Annita Brod, MD  loperamide (IMODIUM) 2 MG capsule Take 1 capsule (2 mg total) by mouth as needed for diarrhea or loose stools. 06/26/21   Sreenath, Trula Slade, MD  mometasone-formoterol (DULERA) 100-5 MCG/ACT AERO Inhale 2 puffs into the lungs 2 (two) times daily.    [provider]  Multiple Vitamin (MULTIVITAMIN WITH MINERALS) TABS tablet Take 1 tablet by mouth daily. 09/12/20   [name], MD  pantoprazole (PROTONIX) 40 MG tablet Take 40 mg by mouth every morning.    [provider]  rivaroxaban (XARELTO) 10 MG TABS tablet Take 1 tablet (10 mg total) by mouth daily. 11/26/21 12/26/21  Earlie Server, MD  rosuvastatin (CRESTOR) 10 MG tablet Take 1 tablet (10 mg total) by mouth daily. Patient taking differently: Take 10 mg by mouth every morning. 10/03/20   Earlie Server, MD   Physical Exam: Vitals:   12/24/21 1700 12/24/21 1730 12/24/21 1800 12/24/21 1830  BP: 118/67 Marland Kitchen)  125/93 (!) 101/59 (!) 120/59  Pulse: 89 81 84 89  Resp: (!) '21 20 15 '$ (!) 35  Temp:      TempSrc:      SpO2: 96% 98% 99% 97%  Weight:      Height:       Constitutional: appears age-appropriate, frail, NAD, calm, comfortable Eyes: PERRL, lids and conjunctivae normal ENMT: Mucous membranes are moist. Posterior pharynx clear of any exudate or lesions. Age-appropriate dentition. Hearing appropriate Neck: normal, supple, no masses, no thyromegaly Respiratory: clear to auscultation bilaterally, no wheezing, no crackles. Normal respiratory effort. No accessory muscle use.  Cardiovascular: Regular rate and rhythm, no murmurs / rubs / gallops. No extremity edema. 2+ pedal pulses. No carotid bruits.  Abdomen: Obese abdomen, no tenderness, no masses palpated, no hepatosplenomegaly. Bowel sounds  positive.  Musculoskeletal: no clubbing / cyanosis. No joint deformity upper and lower extremities. Good ROM, no contractures, no atrophy. Normal muscle tone.  Skin: no rashes, lesions, ulcers. No induration.  Pale Neurologic: Sensation intact. Strength 5/5 in all 4.  Psychiatric: Normal judgment and insight. Alert and oriented x 3. Normal mood.   EKG: not indicated at this time  Chest x-ray on Admission: I personally reviewed and I agree with radiologist reading as below.  CT ABDOMEN PELVIS W CONTRAST  Result Date: 12/24/2021 CLINICAL DATA:  Gross hematuria. Vaginal bleeding. History of kidney stones. EXAM: CT ABDOMEN AND PELVIS WITH CONTRAST TECHNIQUE: Multidetector CT imaging of the abdomen and pelvis was performed using the standard protocol following bolus administration of intravenous contrast. RADIATION DOSE REDUCTION: This exam was performed according to the departmental dose-optimization program which includes automated exposure control, adjustment of the mA and/or kV according to patient size and/or use of iterative reconstruction technique. CONTRAST:  147m OMNIPAQUE IOHEXOL 300 MG/ML  SOLN COMPARISON:  11/17/2021 FINDINGS: Lower chest: Patchy nodular infiltrates in the lung bases with peribronchial thickening and some mucous plugging. Changes may represent multifocal pneumonia. Hepatobiliary: No focal liver abnormality is seen. No gallstones, gallbladder wall thickening, or biliary dilatation. Pancreas: Unremarkable. No pancreatic ductal dilatation or surrounding inflammatory changes. Spleen: Normal in size without focal abnormality. Adrenals/Urinary Tract: No adrenal gland nodules. Renal nephrograms are symmetrical and homogeneous. Bilateral parapelvic cysts. No follow-up is indicated for this typically benign finding. Left hydronephrosis with a left ureteral stent in place. Proximal pigtail is in the left renal pelvis. Distal pigtail is in the bladder. No stones are identified. Bladder wall  is diffusely thickened, suggesting cystitis. No bladder stones. Stomach/Bowel: Stomach is within normal limits. Appendix appears normal. No evidence of bowel wall thickening, distention, or inflammatory changes. Vascular/Lymphatic: Aortic atherosclerosis. No enlarged abdominal or pelvic lymph nodes. Inferior vena caval filter is in place. The IVC below the filter is decompressed. Reproductive: Uterus and ovaries are not enlarged. Other: Scarring in the anterior abdominal wall, likely postoperative. No free air or free fluid in the abdomen. Musculoskeletal: Degenerative changes in the spine. Severe degenerative changes in the hips. No acute bony abnormalities. IMPRESSION: 1. Nodular infiltrates in the lung bases with peribronchial thickening and mucous plugging. Changes may indicate multifocal pneumonia. 2. Left renal hydronephrosis with left ureteral stent in place. No obstructing stones identified. 3. Diffuse bladder wall thickening suggesting cystitis. 4. Severe degenerative changes in the hips. Electronically Signed   By: WLucienne CapersM.D.   On: 12/24/2021 19:19    Labs on Admission: I have personally reviewed following labs  CBC: Recent Labs  Lab 12/24/21 1232  WBC 3.8*  NEUTROABS 2.6  HGB 8.0*  HCT 27.7*  MCV 95.2  PLT 628   Basic Metabolic Panel: Recent Labs  Lab 12/24/21 1232  NA 137  K 3.8  CL 104  CO2 27  GLUCOSE 108*  BUN 18  CREATININE 0.73  CALCIUM 9.0   GFR: Estimated Creatinine Clearance: 63.1 mL/min (by C-G formula based on SCr of 0.73 mg/dL).  Liver Function Tests: Recent Labs  Lab 12/24/21 1232  AST 16  ALT 8  ALKPHOS 45  BILITOT 1.3*  PROT 7.3  ALBUMIN 3.3*   Urine analysis:    Component Value Date/Time   COLORURINE RED (A) 12/24/2021 1753   APPEARANCEUR TURBID (A) 12/24/2021 1753   APPEARANCEUR Hazy (A) 12/14/2021 0854   LABSPEC 1.018 12/24/2021 1753   PHURINE  12/24/2021 1753    TEST NOT REPORTED DUE TO COLOR INTERFERENCE OF URINE PIGMENT    GLUCOSEU (A) 12/24/2021 1753    TEST NOT REPORTED DUE TO COLOR INTERFERENCE OF URINE PIGMENT   HGBUR (A) 12/24/2021 1753    TEST NOT REPORTED DUE TO COLOR INTERFERENCE OF URINE PIGMENT   BILIRUBINUR (A) 12/24/2021 1753    TEST NOT REPORTED DUE TO COLOR INTERFERENCE OF URINE PIGMENT   BILIRUBINUR Negative 12/14/2021 0854   KETONESUR (A) 12/24/2021 1753    TEST NOT REPORTED DUE TO COLOR INTERFERENCE OF URINE PIGMENT   PROTEINUR (A) 12/24/2021 1753    TEST NOT REPORTED DUE TO COLOR INTERFERENCE OF URINE PIGMENT   NITRITE (A) 12/24/2021 1753    TEST NOT REPORTED DUE TO COLOR INTERFERENCE OF URINE PIGMENT   LEUKOCYTESUR (A) 12/24/2021 1753    TEST NOT REPORTED DUE TO COLOR INTERFERENCE OF URINE PIGMENT   Dr. Tobie Poet [facility]  If 7PM-7AM, please contact overnight-coverage provider If 7AM-7PM, please contact day coverage provider [email or web address]  12/24/2021, 10:37 PM

## 2021-12-24 NOTE — Telephone Encounter (Signed)
I spoke with Mrs. Cadena. We have discussed possible surgery dates and Tuesday August 8th, 2023 was agreed upon by all parties. Patient given information about surgery date, what to expect pre-operatively and post operatively.  We discussed that a Pre-Admission Testing office will be calling to set up the pre-op visit that will take place prior to surgery, and that these appointments are typically done over the phone with a Pre-Admissions RN.  Informed patient that our office will communicate any additional care to be provided after surgery. Patients questions or concerns were discussed during our call. Advised to call our office should there be any additional information, questions or concerns that arise. Patient verbalized understanding.

## 2021-12-24 NOTE — Assessment & Plan Note (Signed)
-   Hydralazine 10 mg p.o. every 6 hours as needed for SBP greater than 175, 4 days ordered

## 2021-12-24 NOTE — Assessment & Plan Note (Addendum)
-   This meets criteria for morbid obesity based on the presence of 1 or more chronic comorbidities. Patient has hypertension and hyperlipidemia with BMI of 36.3. This complicates overall care and prognosis.

## 2021-12-24 NOTE — Assessment & Plan Note (Addendum)
Rosuvastatin 

## 2021-12-24 NOTE — ED Provider Notes (Signed)
[facility] Provider Note    Event Date/Time   First MD Initiated Contact with Patient 12/24/21 1636     (approximate)   History   Vaginal Bleeding   HPI  Courtney Mack is a 66 y.o. female with history of DVT, PE on Xarelto, GI bleed, endometrial cancer, kidney stones heart failure, iron deficiency anemia, recurrent UTIs who presents with vaginal bleeding for the last 3 days, persistent course, somewhat worsened today with her having to change a pad every few hours.  She also reports generalized weakness and states that her head "does not feel right."  She denies any abdominal or vaginal pain.  She denies any hematuria or dysuria.  She has no fever or chills.  Denies any other abnormal bleeding or bruising.    Physical Exam   Triage Vital Signs: ED Triage Vitals  Enc Vitals Group     BP 12/24/21 1227 135/67     Pulse Rate 12/24/21 1227 88     Resp 12/24/21 1227 20     Temp 12/24/21 1227 98.4 F (36.9 C)     Temp Source 12/24/21 1227 Oral     SpO2 12/24/21 1227 97 %     Weight 12/24/21 1228 180 lb (81.6 kg)     Height 12/24/21 1228 '4\' 11"'$  (1.499 m)     Head Circumference --      Peak Flow --      Pain Score 12/24/21 1228 0     Pain Loc --      Pain Edu? --      Excl. in [place]? --     Most recent vital signs: Vitals:   12/24/21 2300 12/24/21 2311  BP:  (!) 120/57  Pulse: 91 99  Resp: (!) 23 16  Temp:  99 F (37.2 C)  SpO2: 91% 94%     General: Awake, slightly pale appearing but no distress.  CV:  Good peripheral perfusion.  Resp:  Normal effort.  Abd:  Soft and nontender.  No distention.  Other:  Normal external genitalia.  No blood in the vaginal vault or any vaginal lesions or active hemorrhage.   ED Results / Procedures / Treatments   Labs (all labs ordered are listed, but only abnormal results are displayed) Labs Reviewed  CBC WITH DIFFERENTIAL/PLATELET - Abnormal; Notable for the following components:      Result Value    WBC 3.8 (*)    RBC 2.91 (*)    Hemoglobin 8.0 (*)    HCT 27.7 (*)    MCHC 28.9 (*)    RDW 18.6 (*)    All other components within normal limits  COMPREHENSIVE METABOLIC PANEL - Abnormal; Notable for the following components:   Glucose, Bld 108 (*)    Albumin 3.3 (*)    Total Bilirubin 1.3 (*)    All other components within normal limits  URINALYSIS, ROUTINE W REFLEX MICROSCOPIC - Abnormal; Notable for the following components:   Color, Urine RED (*)    APPearance TURBID (*)    Glucose, UA   (*)    Value: TEST NOT REPORTED DUE TO COLOR INTERFERENCE OF URINE PIGMENT   Hgb urine dipstick   (*)    Value: TEST NOT REPORTED DUE TO COLOR INTERFERENCE OF URINE PIGMENT   Bilirubin Urine   (*)    Value: TEST NOT REPORTED DUE TO COLOR INTERFERENCE OF URINE PIGMENT   Ketones, ur   (*)    Value: TEST NOT REPORTED  DUE TO COLOR INTERFERENCE OF URINE PIGMENT   Protein, ur   (*)    Value: TEST NOT REPORTED DUE TO COLOR INTERFERENCE OF URINE PIGMENT   Nitrite   (*)    Value: TEST NOT REPORTED DUE TO COLOR INTERFERENCE OF URINE PIGMENT   Leukocytes,Ua   (*)    Value: TEST NOT REPORTED DUE TO COLOR INTERFERENCE OF URINE PIGMENT   RBC / HPF >50 (*)    WBC, UA >50 (*)    Bacteria, UA FEW (*)    Non Squamous Epithelial PRESENT (*)    All other components within normal limits  BASIC METABOLIC PANEL  CBC  TYPE AND SCREEN     EKG     RADIOLOGY  CT M/pelvis: I independently viewed and interpreted the images; there is a left ureteral stent in place and bladder wall thickening consistent with cystitis.  PROCEDURES:  Critical Care performed: No  Procedures   MEDICATIONS ORDERED IN ED: Medications  ferrous sulfate tablet 325 mg (has no administration in time range)  rosuvastatin (CRESTOR) tablet 10 mg (has no administration in time range)  acetaminophen (TYLENOL) tablet 650 mg (has no administration in time range)    Or  acetaminophen (TYLENOL) suppository 650 mg (has no  administration in time range)  ondansetron (ZOFRAN) tablet 4 mg (has no administration in time range)    Or  ondansetron (ZOFRAN) injection 4 mg (has no administration in time range)  senna-docusate (Senokot-S) tablet 1 tablet (has no administration in time range)  cefTRIAXone (ROCEPHIN) 1 g in sodium chloride 0.9 % 100 mL IVPB (0 g Intravenous Stopped 12/24/21 2310)  loperamide (IMODIUM) capsule 2 mg (has no administration in time range)  pantoprazole (PROTONIX) EC tablet 40 mg (has no administration in time range)  ascorbic acid (VITAMIN C) tablet 1,000 mg (has no administration in time range)  multivitamin with minerals tablet 1 tablet (has no administration in time range)  mometasone-formoterol (DULERA) 100-5 MCG/ACT inhaler 2 puff (has no administration in time range)  hydrALAZINE (APRESOLINE) tablet 10 mg (has no administration in time range)  sodium chloride irrigation 0.9 % 3,000 mL (has no administration in time range)  iohexol (OMNIPAQUE) 300 MG/ML solution 100 mL (100 mLs Intravenous Contrast Given 12/24/21 1849)     IMPRESSION / MDM / ASSESSMENT AND PLAN / ED COURSE  I reviewed the triage vital signs and the nursing notes.  66 year old female with PMH as noted above presents with vaginal bleeding over the last 3 days.  She denies associated hematuria or dysuria.  She reports feeling some generalized weakness and denies other abnormal bruising or bleeding.  She states that she stopped her Xarelto few days ago when the bleeding started.  On exam the patient is pale but overall well-appearing.  Her vital signs are normal.  The physical exam is otherwise unremarkable.  Abdomen soft and nontender.  Vaginal exam reveals no blood in the vaginal vault or evidence of actual vaginal bleeding.  It appears that this is all hematuria.  I reviewed the past medical records.  The patient was most recently admitted last month with recurrent gross hematuria and anemia with a hemoglobin of 7.7  requiring transfusion.  She had a cystoscopy and replacement of a double-J stent.  Differential diagnosis includes, ureteral stone, stent malfunction,  Patient's presentation is most consistent with acute presentation with potential threat to life or bodily function.  The patient is on the cardiac monitor to evaluate for evidence of arrhythmia and/or significant heart rate  changes.  ----------------------------------------- 8:52 PM on 12/24/2021 -----------------------------------------  Exam revealed that the bleeding was actually not from the vagina, so this is consistent with recurrent hematuria.  The patient's hemoglobin is 8.  Her creatinine and electrolytes are normal.  Urinalysis shows gross hematuria and the CT shows evidence of cystitis which is the likely etiology.  I have ordered a Foley catheter so the patient can have bladder irrigation.  Given her significant anemia and blood loss, I will admit for further work-up and monitoring and serial hemoglobins.  I consulted Dr. Tobie Poet from the hospitalist service; based on her discussion she agrees to admit the patient.  FINAL CLINICAL IMPRESSION(S) / ED DIAGNOSES   Final diagnoses:  Gross hematuria     Rx / DC Orders   ED Discharge Orders     None        Note:  This document was prepared using Dragon voice recognition software and may include unintentional dictation errors.    Arta Silence, MD 12/25/21 0002

## 2021-12-24 NOTE — Assessment & Plan Note (Addendum)
--  no dysuria, however, given hematuria, reasonable to cover for UTI --started on ceftriaxone, however, urine cx was not ordered on admission.  Added-on urine cx pos for 30,000 COLONIES/mL ENTEROCOCCUS FAECIUM --completed abx treatment with fosfomycin x2

## 2021-12-24 NOTE — Assessment & Plan Note (Addendum)
-   cont home rosuvastatin 10 mg q. daily

## 2021-12-24 NOTE — ED Notes (Signed)
RN asked Unit secretary for urology cart.

## 2021-12-24 NOTE — Progress Notes (Signed)
[facility] Posting Form   Surgery Date/Time: Date: 02/12/2022  Surgeon: Dr. John Giovanni, MD Surgery Location: Day Surgery  Inpt ( No  )   Outpt (Yes)   Obs ( No  )   Diagnosis: N13.30 Left Hydronephrosis  -CPT: 09927  Surgery: Cystoscopy with stent exchange   Stop Anticoagulations: Yes, will need to hold Xarelto.   Cardiac/Medical/Pulmonary Clearance needed: yes  *Orders entered into EPIC  Date: 12/24/21   *Case booked in EPIC  Date: 12/17/2021  *Notified pt of Surgery: Date: 12/17/2021  PRE-OP UA & CX: yes, will obtain on 01/28/2022  *Placed into Prior Authorization Work Fabio Bering Date: 12/24/2021   Assistant/laser/rep:No

## 2021-12-24 NOTE — Hospital Course (Addendum)
Courtney Mack is a 66 year old female with history of iron deficiency anemia, chronic lymphedema bilaterally, history of PE on Xarelto, hypertension, endometrial cancer, gastric antral vascular ectasia, who presents to the emergency department for chief concerns of bleeding.  Initial vitals in the emergency department showed temperature of 98.4, respiration rate of 20, heart rate of 88, blood pressure 135/67, SPO2 of 97% on room air.  Serum sodium is 137, potassium 3.8, chloride of 104, bicarb 27, nonfasting blood glucose 108, BUN of 18, serum creatinine of 0.73, GFR greater than 60, WBC 3.8, hemoglobin 8.0, platelets of 249.  UA was ordered however leukocytes and nitrates were not able to be reported due to color interference.  ED treatment: None

## 2021-12-25 ENCOUNTER — Observation Stay: Payer: Medicare (Managed Care)

## 2021-12-25 DIAGNOSIS — R310 Gross hematuria: Secondary | ICD-10-CM | POA: Diagnosis not present

## 2021-12-25 LAB — BASIC METABOLIC PANEL
Anion gap: 4 — ABNORMAL LOW (ref 5–15)
BUN: 17 mg/dL (ref 8–23)
CO2: 27 mmol/L (ref 22–32)
Calcium: 8.7 mg/dL — ABNORMAL LOW (ref 8.9–10.3)
Chloride: 109 mmol/L (ref 98–111)
Creatinine, Ser: 0.62 mg/dL (ref 0.44–1.00)
GFR, Estimated: >60 mL/min (ref >60)
Glucose, Bld: 124 mg/dL — ABNORMAL HIGH (ref 70–99)
Potassium: 3.5 mmol/L (ref 3.5–5.1)
Sodium: 140 mmol/L (ref 135–145)

## 2021-12-25 LAB — CBC
HCT: 25.3 % — ABNORMAL LOW (ref 36.0–46.0)
Hemoglobin: 7.4 g/dL — ABNORMAL LOW (ref 12.0–15.0)
MCH: 27.7 pg (ref 26.0–34.0)
MCHC: 29.2 g/dL — ABNORMAL LOW (ref 30.0–36.0)
MCV: 94.8 fL (ref 80.0–100.0)
Platelets: 216 10*3/uL (ref 150–400)
RBC: 2.67 MIL/uL — ABNORMAL LOW (ref 3.87–5.11)
RDW: 18.4 % — ABNORMAL HIGH (ref 11.5–15.5)
WBC: 6.1 10*3/uL (ref 4.0–10.5)
nRBC: 0 % (ref 0.0–0.2)

## 2021-12-25 MED ORDER — CHLORHEXIDINE GLUCONATE CLOTH 2 % EX PADS
6.0000 | MEDICATED_PAD | Freq: Every day | CUTANEOUS | Status: DC
  Administered 2021-12-25 – 2021-12-31 (×7): 6 via TOPICAL

## 2021-12-25 NOTE — Plan of Care (Signed)

## 2021-12-25 NOTE — Progress Notes (Signed)
[facility]) Hospital Liaison note:  This is a pending outpatient-based Palliative Care patient. Will continue to follow for disposition.  Please call with any outpatient palliative questions or concerns.  Thank you, Lorelee Market, LPN [facility] Liaison [phone]

## 2021-12-25 NOTE — Progress Notes (Signed)
  Progress Note   Patient: Courtney Mack SEL:[id number] DOB: 21-May-1956 DOA: 12/24/2021     0 DOS: the patient was seen and examined on 12/25/2021   Brief hospital course: [name] is a 66 year old female with history of iron deficiency anemia, chronic lymphedema bilaterally, history of PE on Xarelto, hypertension, endometrial cancer, gastric antral vascular ectasia, who presents to the emergency department for chief concerns of bleeding.  Initial vitals in the emergency department showed temperature of 98.4, respiration rate of 20, heart rate of 88, blood pressure 135/67, SPO2 of 97% on room air.  Serum sodium is 137, potassium 3.8, chloride of 104, bicarb 27, nonfasting blood glucose 108, BUN of 18, serum creatinine of 0.73, GFR greater than 60, WBC 3.8, hemoglobin 8.0, platelets of 249.  UA was ordered however leukocytes and nitrates were not able to be reported due to color interference.  ED treatment: None  Assessment and Plan: * Hematuria - Pt has been on Xarelto for >1 year, but hematuria only started this past Sat.   Plan: - Continue to hold Xarelto  --cont Foley with irrigation --monitor Hgb  UTI (urinary tract infection) --no dysuria, however, given sudden hematuria, reasonable to cover for UTI --started on ceftriaxone, however, urine cx was not ordered on admission. Plan: --cont ceftriaxone --add-on urine cx to urine collected on presentation.  IDA (iron deficiency anemia) --cont home iron supplement  History of pulmonary embolus (PE) - Recurrent DVT and massive PE in February 2022 in June 2022, status post thrombectomy and IVC filter placement, on Xarelto - Xarelto has been stopped by patient since 12/22/2021 - Per chart review patient had [facility] IVC filter insertion on 08/18/2020 Plan: --cont to hold Xarelto  HLD (hyperlipidemia) - cont home rosuvastatin 10 mg q. daily  Dyslipidemia - Rosuvastatin  Morbidly obese ([place]) - This meets criteria  for morbid obesity based on the presence of 1 or more chronic comorbidities. Patient has hypertension and hyperlipidemia with BMI of 36.3. This complicates overall care and prognosis.         Subjective:  Pt denied dysuria.  Was doing fine until bleeding started on Sat.   Physical Exam:  Constitutional: NAD, AAOx3 HEENT: conjunctivae and lids normal, EOMI CV: No cyanosis.   RESP: normal respiratory effort, on RA Extremities: edema in BLE with Ted hose on SKIN: warm, dry Neuro: II - XII grossly intact.   Psych: Normal mood and affect.  Appropriate judgement and reason Foley with irrigation, pink urine   Data Reviewed:  Family Communication:   Disposition: Status is: Observation   Planned Discharge Destination: Home    Time spent: 50 minutes  Author: Enzo Bi, MD 12/25/2021 7:17 PM  For on call review [email or web address].

## 2021-12-25 NOTE — Assessment & Plan Note (Signed)
--  cont home iron supplement

## 2021-12-26 DIAGNOSIS — E7800 Pure hypercholesterolemia, unspecified: Secondary | ICD-10-CM | POA: Diagnosis present

## 2021-12-26 DIAGNOSIS — Z6836 Body mass index (BMI) 36.0-36.9, adult: Secondary | ICD-10-CM | POA: Diagnosis not present

## 2021-12-26 DIAGNOSIS — N390 Urinary tract infection, site not specified: Secondary | ICD-10-CM | POA: Diagnosis not present

## 2021-12-26 DIAGNOSIS — Z86711 Personal history of pulmonary embolism: Secondary | ICD-10-CM | POA: Diagnosis not present

## 2021-12-26 DIAGNOSIS — R54 Age-related physical debility: Secondary | ICD-10-CM | POA: Diagnosis present

## 2021-12-26 DIAGNOSIS — I509 Heart failure, unspecified: Secondary | ICD-10-CM | POA: Diagnosis present

## 2021-12-26 DIAGNOSIS — Z95828 Presence of other vascular implants and grafts: Secondary | ICD-10-CM | POA: Diagnosis not present

## 2021-12-26 DIAGNOSIS — Z87442 Personal history of urinary calculi: Secondary | ICD-10-CM | POA: Diagnosis not present

## 2021-12-26 DIAGNOSIS — Z1611 Resistance to penicillins: Secondary | ICD-10-CM | POA: Diagnosis present

## 2021-12-26 DIAGNOSIS — Z87440 Personal history of urinary (tract) infections: Secondary | ICD-10-CM | POA: Diagnosis not present

## 2021-12-26 DIAGNOSIS — T8383XA Hemorrhage of genitourinary prosthetic devices, implants and grafts, initial encounter: Secondary | ICD-10-CM | POA: Diagnosis present

## 2021-12-26 DIAGNOSIS — R310 Gross hematuria: Secondary | ICD-10-CM | POA: Diagnosis present

## 2021-12-26 DIAGNOSIS — Z8249 Family history of ischemic heart disease and other diseases of the circulatory system: Secondary | ICD-10-CM | POA: Diagnosis not present

## 2021-12-26 DIAGNOSIS — Z8719 Personal history of other diseases of the digestive system: Secondary | ICD-10-CM | POA: Diagnosis not present

## 2021-12-26 DIAGNOSIS — Z7901 Long term (current) use of anticoagulants: Secondary | ICD-10-CM | POA: Diagnosis not present

## 2021-12-26 DIAGNOSIS — I890 Lymphedema, not elsewhere classified: Secondary | ICD-10-CM | POA: Diagnosis present

## 2021-12-26 DIAGNOSIS — N136 Pyonephrosis: Secondary | ICD-10-CM | POA: Diagnosis present

## 2021-12-26 DIAGNOSIS — B952 Enterococcus as the cause of diseases classified elsewhere: Secondary | ICD-10-CM | POA: Diagnosis present

## 2021-12-26 DIAGNOSIS — D509 Iron deficiency anemia, unspecified: Secondary | ICD-10-CM | POA: Diagnosis not present

## 2021-12-26 DIAGNOSIS — Z79899 Other long term (current) drug therapy: Secondary | ICD-10-CM | POA: Diagnosis not present

## 2021-12-26 DIAGNOSIS — D62 Acute posthemorrhagic anemia: Secondary | ICD-10-CM | POA: Diagnosis present

## 2021-12-26 DIAGNOSIS — Z923 Personal history of irradiation: Secondary | ICD-10-CM | POA: Diagnosis not present

## 2021-12-26 DIAGNOSIS — N1330 Unspecified hydronephrosis: Secondary | ICD-10-CM | POA: Diagnosis not present

## 2021-12-26 DIAGNOSIS — Y732 Prosthetic and other implants, materials and accessory gastroenterology and urology devices associated with adverse incidents: Secondary | ICD-10-CM | POA: Diagnosis present

## 2021-12-26 DIAGNOSIS — Z86718 Personal history of other venous thrombosis and embolism: Secondary | ICD-10-CM | POA: Diagnosis not present

## 2021-12-26 DIAGNOSIS — I110 Hypertensive heart disease with heart failure: Secondary | ICD-10-CM | POA: Diagnosis present

## 2021-12-26 DIAGNOSIS — N3592 Unspecified urethral stricture, female: Secondary | ICD-10-CM | POA: Diagnosis present

## 2021-12-26 DIAGNOSIS — D500 Iron deficiency anemia secondary to blood loss (chronic): Secondary | ICD-10-CM

## 2021-12-26 LAB — BASIC METABOLIC PANEL
Anion gap: 3 — ABNORMAL LOW (ref 5–15)
BUN: 15 mg/dL (ref 8–23)
CO2: 27 mmol/L (ref 22–32)
Calcium: 8.6 mg/dL — ABNORMAL LOW (ref 8.9–10.3)
Chloride: 110 mmol/L (ref 98–111)
Creatinine, Ser: 0.69 mg/dL (ref 0.44–1.00)
GFR, Estimated: >60 mL/min (ref >60)
Glucose, Bld: 119 mg/dL — ABNORMAL HIGH (ref 70–99)
Potassium: 3.7 mmol/L (ref 3.5–5.1)
Sodium: 140 mmol/L (ref 135–145)

## 2021-12-26 LAB — PREPARE RBC (CROSSMATCH)

## 2021-12-26 LAB — CBC
HCT: 23.9 % — ABNORMAL LOW (ref 36.0–46.0)
Hemoglobin: 6.9 g/dL — ABNORMAL LOW (ref 12.0–15.0)
MCH: 27.1 pg (ref 26.0–34.0)
MCHC: 28.9 g/dL — ABNORMAL LOW (ref 30.0–36.0)
MCV: 93.7 fL (ref 80.0–100.0)
Platelets: 192 10*3/uL (ref 150–400)
RBC: 2.55 MIL/uL — ABNORMAL LOW (ref 3.87–5.11)
RDW: 18 % — ABNORMAL HIGH (ref 11.5–15.5)
WBC: 4.5 10*3/uL (ref 4.0–10.5)
nRBC: 0 % (ref 0.0–0.2)

## 2021-12-26 LAB — HEMOGLOBIN AND HEMATOCRIT, BLOOD
HCT: 28.7 % — ABNORMAL LOW (ref 36.0–46.0)
Hemoglobin: 8.7 g/dL — ABNORMAL LOW (ref 12.0–15.0)

## 2021-12-26 LAB — MAGNESIUM: Magnesium: 2.1 mg/dL (ref 1.7–2.4)

## 2021-12-26 MED ORDER — FOSFOMYCIN TROMETHAMINE 3 G PO PACK
3.0000 g | PACK | Freq: Once | ORAL | Status: AC
  Administered 2021-12-26: 3 g via ORAL
  Filled 2021-12-26: qty 3

## 2021-12-26 MED ORDER — SODIUM CHLORIDE 0.9 % IV SOLN
3.0000 g | Freq: Once | INTRAVENOUS | Status: DC
  Filled 2021-12-26: qty 8

## 2021-12-26 MED ORDER — SODIUM CHLORIDE 0.9 % IV SOLN: 3.0000 g | Freq: Three times a day (TID) | INTRAVENOUS | Status: DC

## 2021-12-26 MED ORDER — SODIUM CHLORIDE 0.9% IV SOLUTION: Freq: Once | INTRAVENOUS | Status: AC

## 2021-12-26 MED ORDER — SODIUM CHLORIDE 0.9 % IV SOLN
3.0000 g | Freq: Three times a day (TID) | INTRAVENOUS | Status: DC
  Filled 2021-12-26: qty 8

## 2021-12-26 NOTE — Progress Notes (Signed)
Pt urine was starting to become more clear with CBI with no clots.  Dr. Billie Ruddy ordered to hold CBI, it was stopped for around 2 hours and nurse noticed clots were forming again and urine was looking more red.  Dr. Billie Ruddy notified and CBI was restarted.

## 2021-12-26 NOTE — Assessment & Plan Note (Signed)
--  currently blood loss is from hematuria. Plan: --1u pRBC for Hgb 6.9

## 2021-12-26 NOTE — Progress Notes (Signed)
  Progress Note   Patient: Courtney Mack XAJ:[id number] DOB: September 23, 1955 DOA: 12/24/2021     0 DOS: the patient was seen and examined on 12/26/2021   Brief hospital course: [name] is a 66 year old female with history of iron deficiency anemia, chronic lymphedema bilaterally, history of PE on Xarelto, hypertension, endometrial cancer, gastric antral vascular ectasia, who presents to the emergency department for chief concerns of bleeding.  Initial vitals in the emergency department showed temperature of 98.4, respiration rate of 20, heart rate of 88, blood pressure 135/67, SPO2 of 97% on room air.  Serum sodium is 137, potassium 3.8, chloride of 104, bicarb 27, nonfasting blood glucose 108, BUN of 18, serum creatinine of 0.73, GFR greater than 60, WBC 3.8, hemoglobin 8.0, platelets of 249.  UA was ordered however leukocytes and nitrates were not able to be reported due to color interference.  ED treatment: None  Assessment and Plan: * Hematuria -after reviewing past records, pt did have hematuria in the past.  Pt has had bladder neck bleeding that appeared consistent with radiation cystitis s/p recent Fulguration.  Currently has a left stent.   Plan: - Continue to hold Xarelto  --cont Foley with irrigation --monitor Hgb --urology consult today  Hydronephrosis, left --CT showed left hydronephrosis with left ureteral stent in place.  No obstructing stones identified.  Per urology, pt may have ureteral stricture.  When stent was removed in the past, pt ended up presenting with septic shock. Plan: --urology consult today  UTI (urinary tract infection) --no dysuria, however, given hematuria, reasonable to cover for UTI --started on ceftriaxone, however, urine cx was not ordered on admission.  Added-on urine cx pos for 30,000 COLONIES/mL ENTEROCOCCUS FAECIUM Plan: --d/c ceftriaxone --fosfomycin x1 today  IDA (iron deficiency anemia) --cont home iron supplement  Blood  loss anemia --currently blood loss is from hematuria. Plan: --1u pRBC for Hgb 6.9  History of pulmonary embolus (PE) Recurrent DVT - Recurrent DVT and massive PE in February 2022 in June 2022, status post thrombectomy and IVC filter placement, on Xarelto - Xarelto has been stopped by patient since 12/22/2021 - Per chart review patient had [facility] IVC filter insertion on 08/18/2020 Plan: --cont to hold Xarelto  HLD (hyperlipidemia) - cont home rosuvastatin 10 mg q. daily  Dyslipidemia - Rosuvastatin  Morbidly obese ([place]) - This meets criteria for morbid obesity based on the presence of 1 or more chronic comorbidities. Patient has hypertension and hyperlipidemia with BMI of 36.3. This complicates overall care and prognosis.         Subjective:  Pt complained of some LLQ tenderness.  BM normal.  Pt denied dysuria.  Urine was running clear this morning, so bladder irrigation stopped, however, urine started turning pink again with blood clots shortly after.  Urology consulted.   Physical Exam:  Constitutional: NAD, AAOx3 HEENT: conjunctivae and lids normal, EOMI CV: No cyanosis.   RESP: normal respiratory effort, on RA Extremities: non-pitting edema in BLE SKIN: warm, dry Neuro: II - XII grossly intact.   Psych: Normal mood and affect.  Appropriate judgement and reason   Data Reviewed:  Family Communication:   Disposition: Status is: Observation   Planned Discharge Destination: Home    Time spent: 60 minutes  Author: Enzo Bi, MD 12/26/2021 5:28 PM  For on call review [email or web address].

## 2021-12-26 NOTE — Assessment & Plan Note (Addendum)
--  Chronic --CT showed left hydronephrosis with left ureteral stent in place.  No obstructing stones identified.  Per urology, pt may have ureteral stricture.  When stent was removed in the past, pt ended up presenting with septic shock. --urology consulted Plan: --left nephrostomy tube today --need stent removal as outpatient as soon as possible

## 2021-12-27 ENCOUNTER — Inpatient Hospital Stay: Payer: Medicare (Managed Care)

## 2021-12-27 ENCOUNTER — Inpatient Hospital Stay: Payer: Medicare (Managed Care) | Admitting: Radiology

## 2021-12-27 DIAGNOSIS — D509 Iron deficiency anemia, unspecified: Secondary | ICD-10-CM

## 2021-12-27 DIAGNOSIS — R310 Gross hematuria: Secondary | ICD-10-CM | POA: Diagnosis not present

## 2021-12-27 HISTORY — PX: IR NEPHROSTOMY PLACEMENT LEFT: IMG6063

## 2021-12-27 LAB — URINE CULTURE: Culture: 30000 — AB

## 2021-12-27 LAB — BASIC METABOLIC PANEL
Anion gap: 3 — ABNORMAL LOW (ref 5–15)
BUN: 15 mg/dL (ref 8–23)
CO2: 25 mmol/L (ref 22–32)
Calcium: 8.5 mg/dL — ABNORMAL LOW (ref 8.9–10.3)
Chloride: 111 mmol/L (ref 98–111)
Creatinine, Ser: 0.67 mg/dL (ref 0.44–1.00)
GFR, Estimated: >60 mL/min (ref >60)
Glucose, Bld: 112 mg/dL — ABNORMAL HIGH (ref 70–99)
Potassium: 3.9 mmol/L (ref 3.5–5.1)
Sodium: 139 mmol/L (ref 135–145)

## 2021-12-27 LAB — CBC
HCT: 24.9 % — ABNORMAL LOW (ref 36.0–46.0)
Hemoglobin: 7.6 g/dL — ABNORMAL LOW (ref 12.0–15.0)
MCH: 27.5 pg (ref 26.0–34.0)
MCHC: 30.5 g/dL (ref 30.0–36.0)
MCV: 90.2 fL (ref 80.0–100.0)
Platelets: 220 10*3/uL (ref 150–400)
RBC: 2.76 MIL/uL — ABNORMAL LOW (ref 3.87–5.11)
RDW: 18.7 % — ABNORMAL HIGH (ref 11.5–15.5)
WBC: 4.7 10*3/uL (ref 4.0–10.5)
nRBC: 0 % (ref 0.0–0.2)

## 2021-12-27 LAB — PROTIME-INR
INR: 1.1 (ref 0.8–1.2)
Prothrombin Time: 14.4 seconds (ref 11.4–15.2)

## 2021-12-27 LAB — APTT: aPTT: 37 seconds — ABNORMAL HIGH (ref 24–36)

## 2021-12-27 LAB — MAGNESIUM: Magnesium: 2 mg/dL (ref 1.7–2.4)

## 2021-12-27 MED ORDER — SODIUM CHLORIDE 0.9 % IV SOLN
1.0000 g | Freq: Once | INTRAVENOUS | Status: DC
  Filled 2021-12-27: qty 10

## 2021-12-27 MED ORDER — SODIUM CHLORIDE 0.9% FLUSH
5.0000 mL | Freq: Three times a day (TID) | INTRAVENOUS | Status: DC
  Administered 2021-12-27 – 2021-12-31 (×12): 5 mL

## 2021-12-27 MED ORDER — LIDOCAINE HCL 1 % IJ SOLN
INTRAMUSCULAR | Status: AC
  Administered 2021-12-27: 14 mL
  Filled 2021-12-27: qty 20

## 2021-12-27 MED ORDER — LIDOCAINE HCL 1 % IJ SOLN
INTRAMUSCULAR | Status: AC
  Administered 2021-12-27: 10 mL
  Filled 2021-12-27: qty 20

## 2021-12-27 MED ORDER — FENTANYL CITRATE (PF) 100 MCG/2ML IJ SOLN
INTRAMUSCULAR | Status: AC
  Filled 2021-12-27: qty 2

## 2021-12-27 MED ORDER — HYDROCODONE-ACETAMINOPHEN 5-325 MG PO TABS
1.0000 | ORAL_TABLET | ORAL | Status: DC | PRN
  Administered 2021-12-27 – 2021-12-30 (×6): 1 via ORAL
  Filled 2021-12-27 (×6): qty 1

## 2021-12-27 MED ORDER — MIDAZOLAM HCL 2 MG/2ML IJ SOLN
INTRAMUSCULAR | Status: AC
  Filled 2021-12-27: qty 2

## 2021-12-27 MED ORDER — FENTANYL CITRATE (PF) 100 MCG/2ML IJ SOLN
INTRAMUSCULAR | Status: AC | PRN
  Administered 2021-12-27: 25 ug via INTRAVENOUS
  Administered 2021-12-27: 50 ug via INTRAVENOUS
  Administered 2021-12-27 (×2): 25 ug via INTRAVENOUS

## 2021-12-27 MED ORDER — IOHEXOL 350 MG/ML SOLN
8.0000 mL | Freq: Once | INTRAVENOUS | Status: AC | PRN
  Administered 2021-12-27: 8 mL

## 2021-12-27 MED ORDER — MIDAZOLAM HCL 2 MG/2ML IJ SOLN
INTRAMUSCULAR | Status: AC | PRN
  Administered 2021-12-27: 1 mg via INTRAVENOUS
  Administered 2021-12-27 (×2): .5 mg via INTRAVENOUS

## 2021-12-27 NOTE — Consult Note (Signed)
Chief Complaint: Patient was seen in consultation today for hematuria and left hydronephrosis with a ureteral stent in place.  Referring Physician(s): Carman Ching, PA-C  Supervising Physician: Roanna Banning  Patient Status: [facility] - In-pt  History of Present Illness: Courtney Mack is a 66 y.o. female with PMH significant for iron deficiency anemia, chronic bilateral lymphedema, kidney stones, history of PE on Xarelto, HTN, CHF, endometrial cancer, and obesity who presented to [facility] ED on 6/19 with concerns of vaginal bleeding x3 days. Further exam in the ED revealed that patient was experiencing hematuria. CT on 6/19 revealed concerns for multifocal pneumonia, possible cystitis, and left hydronephrosis with a ureteral stent in place Patient was subsequently admitted to [facility] for management. IR was consulted on 6/22 at the request of urology to place a percutaneous nephrostomy tube.   Patient has been holding Xarelto since 12/22/21 and is currently NPO.   Past Medical History:  Diagnosis Date   Acute bilateral deep vein thrombosis (DVT) of femoral veins (HCC) 01/02/2021   Acute massive pulmonary embolism (HCC) 08/25/2020   Last Assessment & Plan:  Formatting of this note might be different from the original. Post vena caval filter and on xarelto and O2   Anemia    ARF (acute respiratory failure) (HCC)    Cancer (HCC)    endometrial   Cellulitis of left lower leg 06/20/2021   CHF (congestive heart failure) (HCC)    COVID-19    Diabetes mellitus without complication (HCC)    DVT (deep venous thrombosis) (HCC) 09/06/2020   High cholesterol    Hx of blood clots    Hyperlipidemia    Hypertension    IDA (iron deficiency anemia) 09/21/2020   Obesity    Pressure injury of skin 06/21/2021   Pulmonary embolism (HCC)    Sepsis (HCC)    Thrombocytopenia (HCC)    Urinary tract infection 09/13/2021    Past Surgical History:  Procedure Laterality Date   COLONOSCOPY N/A  06/21/2021   Procedure: COLONOSCOPY;  Surgeon: Toledo, Boykin Nearing, MD;  Location: [facility] ENDOSCOPY;  Service: Gastroenterology;  Laterality: N/A;   CYSTOSCOPY W/ URETERAL STENT PLACEMENT Left 09/11/2021   Procedure: CYSTOSCOPY WITH RETROGRADE PYELOGRAM/URETERAL STENT PLACEMENT;  Surgeon: Riki Altes, MD;  Location: [facility] ORS;  Service: Urology;  Laterality: Left;   CYSTOSCOPY W/ URETERAL STENT PLACEMENT Left 11/17/2021   Procedure: CYSTOSCOPY WITH STENT REPLACEMENT;  Surgeon: Bjorn Pippin, MD;  Location: [facility] ORS;  Service: Urology;  Laterality: Left;   CYSTOSCOPY WITH FULGERATION  11/17/2021   Procedure: CYSTOSCOPY WITH FULGERATION;  Surgeon: Bjorn Pippin, MD;  Location: [facility] ORS;  Service: Urology;;   CYSTOSCOPY WITH STENT PLACEMENT Left 10/17/2021   Procedure: CYSTOSCOPY WITH STENT PLACEMENT;  Surgeon: Riki Altes, MD;  Location: [facility] ORS;  Service: Urology;  Laterality: Left;   CYSTOSCOPY WITH URETEROSCOPY, STONE BASKETRY AND STENT PLACEMENT Left 10/16/2021   Procedure: CYSTOSCOPY WITH URETEROSCOPY  AND STENT REMOVAL;  Surgeon: Riki Altes, MD;  Location: [facility] ORS;  Service: Urology;  Laterality: Left;   ENTEROSCOPY N/A 08/17/2021   Procedure: ENTEROSCOPY;  Surgeon: Toney Reil, MD;  Location: [facility] ENDOSCOPY;  Service: Gastroenterology;  Laterality: N/A;   ESOPHAGOGASTRODUODENOSCOPY N/A 06/21/2021   Procedure: ESOPHAGOGASTRODUODENOSCOPY (EGD);  Surgeon: Toledo, Boykin Nearing, MD;  Location: [facility] ENDOSCOPY;  Service: Gastroenterology;  Laterality: N/A;   ESOPHAGOGASTRODUODENOSCOPY (EGD) WITH PROPOFOL N/A 08/17/2021   Procedure: ESOPHAGOGASTRODUODENOSCOPY (EGD) WITH PROPOFOL;  Surgeon: Toney Reil, MD;  Location: [facility] ENDOSCOPY;  Service: Gastroenterology;  Laterality: N/A;   ESOPHAGOGASTRODUODENOSCOPY (EGD) WITH PROPOFOL N/A 10/14/2021   Procedure: ESOPHAGOGASTRODUODENOSCOPY (EGD) WITH PROPOFOL;  Surgeon: Midge Minium, MD;  Location: [facility] ENDOSCOPY;  Service: Endoscopy;  Laterality:  N/A;   GIVENS CAPSULE STUDY N/A 06/22/2021   Procedure: GIVENS CAPSULE STUDY;  Surgeon: Toledo, Boykin Nearing, MD;  Location: [facility] ENDOSCOPY;  Service: Gastroenterology;  Laterality: N/A;   IVC FILTER INSERTION N/A 08/18/2020   Procedure: IVC FILTER INSERTION;  Surgeon: Renford Dills, MD;  Location: [facility] INVASIVE CV LAB;  Service: Cardiovascular;  Laterality: N/A;   PERIPHERAL VASCULAR THROMBECTOMY Bilateral 01/03/2021   Procedure: PERIPHERAL VASCULAR THROMBECTOMY;  Surgeon: Annice Needy, MD;  Location: [facility] INVASIVE CV LAB;  Service: Cardiovascular;  Laterality: Bilateral;   PULMONARY THROMBECTOMY N/A 08/18/2020   Procedure: PULMONARY THROMBECTOMY;  Surgeon: Renford Dills, MD;  Location: [facility] INVASIVE CV LAB;  Service: Cardiovascular;  Laterality: N/A;   TUBAL LIGATION     VISCERAL ANGIOGRAPHY N/A 07/18/2021   Procedure: VISCERAL ANGIOGRAPHY;  Surgeon: Annice Needy, MD;  Location: [facility] INVASIVE CV LAB;  Service: Cardiovascular;  Laterality: N/A;    Allergies: Patient has no known allergies.  Medications: Prior to Admission medications   Medication Sig Start Date End Date Taking? Authorizing Provider  acetaminophen (TYLENOL) 500 MG tablet Take 500 mg by mouth every 6 (six) hours as needed for mild pain or fever.    [provider]  Ascorbic Acid (VITAMIN C) 1000 MG tablet Take 1,000 mg by mouth daily.    [provider]  BREO ELLIPTA 100-25 MCG/ACT AEPB Inhale 1 puff into the lungs daily. 11/15/21   [provider]  Ferrous Sulfate (IRON) 325 (65 Fe) MG TABS Take 325 mg by mouth every other day. 07/05/21   [provider]  furosemide (LASIX) 20 MG tablet Take 1 tablet (20 mg total) by mouth daily. Patient taking differently: Take 20 mg by mouth every morning. 09/18/21 09/18/22  Hollice Espy, MD  loperamide (IMODIUM) 2 MG capsule Take 1 capsule (2 mg total) by mouth as needed for diarrhea or loose stools. 06/26/21   Sreenath, Jonelle Sports, MD   mometasone-formoterol (DULERA) 100-5 MCG/ACT AERO Inhale 2 puffs into the lungs 2 (two) times daily.    [provider]  Multiple Vitamin (MULTIVITAMIN WITH MINERALS) TABS tablet Take 1 tablet by mouth daily. 09/12/20   Standley Brooking, MD  pantoprazole (PROTONIX) 40 MG tablet Take 40 mg by mouth every morning.    [provider]  rivaroxaban (XARELTO) 10 MG TABS tablet Take 1 tablet (10 mg total) by mouth daily. 11/26/21 12/26/21  Rickard Patience, MD  rosuvastatin (CRESTOR) 10 MG tablet Take 1 tablet (10 mg total) by mouth daily. Patient taking differently: Take 10 mg by mouth every morning. 10/03/20   Rickard Patience, MD     Family History  Problem Relation Age of Onset   Hypertension Mother    Diabetes Mother    Diabetes Father    Hypertension Father    High Cholesterol Father    Congestive Heart Failure Father    Breast cancer Cousin     Social History   Socioeconomic History   Marital status: Single    Spouse name: Not on file   Number of children: Not on file   Years of education: Not on file   Highest education level: Not on file  Occupational History   Not on file  Tobacco Use   Smoking status: Never   Smokeless tobacco: Never  Tobacco comments:    smoked 2-4 cigarettes for about 2-3 weeks   Vaping Use   Vaping Use: Never used  Substance and Sexual Activity   Alcohol use: Not Currently   Drug use: Not Currently   Sexual activity: Not Currently  Other Topics Concern   Not on file  Social History Narrative   Not on file   Social Determinants of Health   Financial Resource Strain: Not on file  Food Insecurity: Not on file  Transportation Needs: Not on file  Physical Activity: Not on file  Stress: Not on file  Social Connections: Not on file     Review of Systems: A 12 point ROS discussed and pertinent positives are indicated in the HPI above.  All other systems are negative.  Review of Systems  Constitutional:  Negative for chills and fever.   Respiratory:  Negative for cough and shortness of breath.   Cardiovascular:  Negative for chest pain.  Gastrointestinal:  Negative for abdominal pain, diarrhea, nausea and vomiting.  Genitourinary:  Positive for hematuria.  Musculoskeletal:  Negative for back pain.  Neurological:  Negative for dizziness and headaches.    Vital Signs: BP (!) 104/56 (BP Location: Right Arm)   Pulse 80   Temp (!) 97.5 F (36.4 C)   Resp 16   Ht 4\' 11"  (1.499 m)   Wt 180 lb (81.6 kg)   SpO2 92%   BMI 36.36 kg/m     Physical Exam Vitals and nursing note reviewed.  Constitutional:      General: She is not in acute distress.    Appearance: She is not ill-appearing.  HENT:     Head: Normocephalic.     Mouth/Throat:     Mouth: Mucous membranes are moist.     Pharynx: Oropharynx is clear. No oropharyngeal exudate or posterior oropharyngeal erythema.  Cardiovascular:     Rate and Rhythm: Normal rate and regular rhythm.  Pulmonary:     Effort: Pulmonary effort is normal.     Breath sounds: Normal breath sounds.  Abdominal:     General: There is no distension.     Palpations: Abdomen is soft.     Tenderness: There is no abdominal tenderness.  Skin:    General: Skin is warm and dry.  Neurological:     Mental Status: She is alert and oriented to person, place, and time.  Psychiatric:        Mood and Affect: Mood normal.        Behavior: Behavior normal.        Thought Content: Thought content normal.        Judgment: Judgment normal.     Imaging: US Venous Img Lower Bilateral (DVT)  Result Date: 12/25/2021 CLINICAL DATA:  Bilateral leg pain and swelling. History of DVT and patient has an IVC filter. EXAM: BILATERAL LOWER EXTREMITY VENOUS DOPPLER ULTRASOUND TECHNIQUE: Gray-scale sonography with graded compression, as well as color Doppler and duplex ultrasound were performed to evaluate the lower extremity deep venous systems from the level of the common femoral vein and including the common  femoral, femoral, profunda femoral, popliteal and calf veins including the posterior tibial, peroneal and gastrocnemius veins when visible. The superficial great saphenous vein was also interrogated. Spectral Doppler was utilized to evaluate flow at rest and with distal augmentation maneuvers in the common femoral, femoral and popliteal veins. COMPARISON:  Lower extremity venous duplex 11/17/2021 FINDINGS: RIGHT LOWER EXTREMITY Common Femoral Vein: Again noted is incomplete compressibility of the  right common femoral vein. Color Doppler flow within the right common femoral vein. Findings likely represent the sequelae of prior DVT. Saphenofemoral Junction: No evidence of thrombus. Normal color Doppler flow. Profunda Femoral Vein: Again noted is incomplete compressibility with color Doppler flow. Femoral Vein: Again noted is incomplete compressibility in the right femoral vein with echogenic wall thickening. There is color Doppler flow in the right femoral vein. Findings are most compatible with sequela of prior DVT. Popliteal Vein: Again noted is partial compressibility in the popliteal vein. There is color Doppler flow. Calf Veins: Limited evaluation. Other Findings:  None. LEFT LOWER EXTREMITY Common Femoral Vein: Again noted is echogenic wall thickening and partial compressibility of the left common femoral vein compatible with changes from prior DVT. Color Doppler flow in the left common femoral vein. Saphenofemoral Junction: No evidence of thrombus. Normal color Doppler flow. Profunda Femoral Vein: Again noted is partial compressibility with color Doppler flow. Femoral Vein: Again noted is incomplete compressibility of the left femoral vein with some color Doppler flow. Popliteal Vein: Incomplete compressibility with color Doppler flow. Calf Veins: Limited evaluation. Other Findings:  None. IMPRESSION: 1. Stable venous duplex of the bilateral lower extremities. Again noted is incomplete compressibility of the  bilateral common femoral veins, profunda femoral veins, femoral veins and popliteal veins. Findings are most compatible with the sequelae of previous DVT. No evidence to suggest acute thrombosis. 2. Limited evaluation of the deep calf veins bilaterally. Electronically Signed   By: Richarda Overlie M.D.   On: 12/25/2021 10:41   CT ABDOMEN PELVIS W CONTRAST  Result Date: 12/24/2021 CLINICAL DATA:  Gross hematuria. Vaginal bleeding. History of kidney stones. EXAM: CT ABDOMEN AND PELVIS WITH CONTRAST TECHNIQUE: Multidetector CT imaging of the abdomen and pelvis was performed using the standard protocol following bolus administration of intravenous contrast. RADIATION DOSE REDUCTION: This exam was performed according to the departmental dose-optimization program which includes automated exposure control, adjustment of the mA and/or kV according to patient size and/or use of iterative reconstruction technique. CONTRAST:  OMNIPAQUE IOHEXOL 300 MG/ML  SOLN COMPARISON:  11/17/2021 FINDINGS: Lower chest: Patchy nodular infiltrates in the lung bases with peribronchial thickening and some mucous plugging. Changes may represent multifocal pneumonia. Hepatobiliary: No focal liver abnormality is seen. No gallstones, gallbladder wall thickening, or biliary dilatation. Pancreas: Unremarkable. No pancreatic ductal dilatation or surrounding inflammatory changes. Spleen: Normal in size without focal abnormality. Adrenals/Urinary Tract: No adrenal gland nodules. Renal nephrograms are symmetrical and homogeneous. Bilateral parapelvic cysts. No follow-up is indicated for this typically benign finding. Left hydronephrosis with a left ureteral stent in place. Proximal pigtail is in the left renal pelvis. Distal pigtail is in the bladder. No stones are identified. Bladder wall is diffusely thickened, suggesting cystitis. No bladder stones. Stomach/Bowel: Stomach is within normal limits. Appendix appears normal. No evidence of bowel wall  thickening, distention, or inflammatory changes. Vascular/Lymphatic: Aortic atherosclerosis. No enlarged abdominal or pelvic lymph nodes. Inferior vena caval filter is in place. The IVC below the filter is decompressed. Reproductive: Uterus and ovaries are not enlarged. Other: Scarring in the anterior abdominal wall, likely postoperative. No free air or free fluid in the abdomen. Musculoskeletal: Degenerative changes in the spine. Severe degenerative changes in the hips. No acute bony abnormalities. IMPRESSION: 1. Nodular infiltrates in the lung bases with peribronchial thickening and mucous plugging. Changes may indicate multifocal pneumonia. 2. Left renal hydronephrosis with left ureteral stent in place. No obstructing stones identified. 3. Diffuse bladder wall thickening suggesting cystitis. 4. Severe  degenerative changes in the hips. Electronically Signed   By: Burman Nieves M.D.   On: 12/24/2021 19:19    Labs:  CBC: Recent Labs    12/24/21 1232 12/25/21 0316 12/26/21 0323 12/26/21 1513 12/27/21 0357  WBC 3.8* 6.1 4.5  --  4.7  HGB 8.0* 7.4* 6.9* 8.7* 7.6*  HCT 27.7* 25.3* 23.9* 28.7* 24.9*  PLT 249 216 192  --  220    COAGS: Recent Labs    10/12/21 1540 10/16/21 2235 11/17/21 0537 11/17/21 0950 12/27/21 0930  INR 1.3* 1.4* 1.3*  --  1.1  APTT  --  30 171* 98* 37*    BMP: Recent Labs    12/24/21 1232 12/25/21 0316 12/26/21 0323 12/27/21 0357  NA 137 140 140 139  K 3.8 3.5 3.7 3.9  CL 104 109 110 111  CO2 27 27 27 25   GLUCOSE 108* 124* 119* 112*  BUN 18 17 15 15   CALCIUM 9.0 8.7* 8.6* 8.5*  CREATININE 0.73 0.62 0.69 0.67  GFRNONAA >60 >60 >60 >60    LIVER FUNCTION TESTS: Recent Labs    11/16/21 1827 11/17/21 0109 11/26/21 1359 12/24/21 1232  BILITOT 0.7 0.7 0.9 1.3*  AST 24 21 22 16   ALT 11 10 12 8   ALKPHOS 49 44 44 45  PROT 7.2 6.6 7.2 7.3  ALBUMIN 3.2* 2.8* 3.4* 3.3*    TUMOR MARKERS: No results for input(s): "AFPTM", "CEA", "CA199", "CHROMGRNA"  in the last 8760 hours.  Assessment and Plan:  Courtney Mack is a 66 year old female with complicated PMH who is being seen today for evaluation for placement of a left percutaneous nephrostomy tube. Patient has been experiencing gross hematuria and left hydronephrosis s/p ureteral stent placement 10/17/21. Dr. Milford Cage has reviewed imaging and approved patient for left PCN placement to be done 6/22.  Risks and benefits of left PCN placement was discussed with the patient including, but not limited to, infection, bleeding, significant bleeding causing loss or decrease in renal function or damage to adjacent structures.   All of the patient's questions were answered, patient is agreeable to proceed.  Consent signed and in chart.   Thank you for this interesting consult.  I greatly enjoyed meeting Courtney Mack and look forward to participating in their care.  A copy of this report was sent to the requesting provider on this date.   Lynnette Caffey, PA-C  12/27/21 11:41 am   I spent a total of 40 Minutes  in face to face in clinical consultation, greater than 50% of which was counseling/coordinating care for left percutaneous nephrostomy tube placement

## 2021-12-27 NOTE — Progress Notes (Signed)
Patient clinically stable post left 10 fr Nephrostomy tube placement per Dr Maryelizabeth Kaufmann, tolerated well with vitals stable pre and post procedure. Awake/alert and oriented post procedure. Denies complaints at present time.

## 2021-12-27 NOTE — Consult Note (Signed)
Urology Consult  I have been asked to see the patient by Dr. Billie Ruddy, for evaluation and management of gross hematuria and blood loss anemia.  Chief Complaint: Gross hematuria  History of Present Illness: Courtney Mack is a 66 y.o. year old female with PMH recurrent septic shock associated with left hydronephrosis without obstructing stone managed with chronic indwelling left ureteral stent managed by Dr. Bernardo Heater, DVT, PE on Xarelto, GI bleed, endometrial cancer, heart failure, iron deficiency anemia, and recurrent UTIs who presented to the ED 3 days ago with reports of gross hematuria.  She stopped Xarelto on her own when the bleeding began and it has been held since.  Foley catheter was placed in the emergency room and she has been on CBI intermittently since.  Admission CTAP with contrast revealed persistent left hydronephrosis without obstructing stone and left ureteral stent in appropriate position.  Her hemoglobin has dropped during admission from 8.0 to a nadir of 6.9.  She is s/p transfusion of 1 unit RBCs yesterday.  Hemoglobin 7.6 this morning.  Admission UA was notable for >50 RBCs/hpf, >50 WBCs/hpf, few bacteria, and WBC clumps. Urine culture pending with 30,000 colonies/mL Enterococcus faecium.  On antibiotics as below.  Foley catheter in place draining clear urine today.  Patient makes her own medical decisions.  She has a diet order and is ordered breakfast, but is not yet arrived.  She is n.p.o. since midnight.  Anti-infectives (From admission, onward)    Start     Dose/Rate Route Frequency Ordered Stop   12/26/21 2100  Ampicillin-Sulbactam (UNASYN) 3 g in sodium chloride 0.9 % 100 mL IVPB  Status:  Discontinued        3 g 200 mL/hr over 30 Minutes Intravenous Every 8 hours 12/26/21 1018 12/26/21 1120   12/26/21 1300  Ampicillin-Sulbactam (UNASYN) 3 g in sodium chloride 0.9 % 100 mL IVPB  Status:  Discontinued        3 g 200 mL/hr over 30 Minutes Intravenous Every 8  hours 12/26/21 1015 12/26/21 1018   12/26/21 1215  fosfomycin (MONUROL) packet 3 g        3 g Oral  Once 12/26/21 1120 12/26/21 1453   12/26/21 1100  Ampicillin-Sulbactam (UNASYN) 3 g in sodium chloride 0.9 % 100 mL IVPB  Status:  Discontinued        3 g 200 mL/hr over 30 Minutes Intravenous  Once 12/26/21 1018 12/26/21 1120   12/24/21 2130  cefTRIAXone (ROCEPHIN) 1 g in sodium chloride 0.9 % 100 mL IVPB  Status:  Discontinued        1 g 200 mL/hr over 30 Minutes Intravenous Every 24 hours 12/24/21 2056 12/26/21 1015       Past Medical History:  Diagnosis Date   Acute bilateral deep vein thrombosis (DVT) of femoral veins ([place]) 01/02/2021   Acute massive pulmonary embolism ([place]) 08/25/2020   Last Assessment & Plan:  Formatting of this note might be different from the original. Post vena caval filter and on xarelto and O2   Anemia    ARF (acute respiratory failure) ([place])    Cancer ([place])    endometrial   Cellulitis of left lower leg 06/20/2021   CHF (congestive heart failure) ([place])    COVID-19    Diabetes mellitus without complication ([place])    DVT (deep venous thrombosis) ([place]) 09/06/2020   High cholesterol    Hx of blood clots    Hyperlipidemia    Hypertension    IDA (iron  deficiency anemia) 09/21/2020   Obesity    Pressure injury of skin 06/21/2021   Pulmonary embolism ([place])    Sepsis ([place])    Thrombocytopenia ([place])    Urinary tract infection 09/13/2021    Past Surgical History:  Procedure Laterality Date   COLONOSCOPY N/A 06/21/2021   Procedure: COLONOSCOPY;  Surgeon: Toledo, Benay Pike, MD;  Location: [facility] ENDOSCOPY;  Service: Gastroenterology;  Laterality: N/A;   CYSTOSCOPY W/ URETERAL STENT PLACEMENT Left 09/11/2021   Procedure: CYSTOSCOPY WITH RETROGRADE PYELOGRAM/URETERAL STENT PLACEMENT;  Surgeon: Abbie Sons, MD;  Location: [facility] ORS;  Service: Urology;  Laterality: Left;   CYSTOSCOPY W/ URETERAL STENT PLACEMENT Left 11/17/2021   Procedure: CYSTOSCOPY WITH STENT  REPLACEMENT;  Surgeon: Irine Seal, MD;  Location: [facility] ORS;  Service: Urology;  Laterality: Left;   CYSTOSCOPY WITH FULGERATION  11/17/2021   Procedure: CYSTOSCOPY WITH FULGERATION;  Surgeon: Irine Seal, MD;  Location: [facility] ORS;  Service: Urology;;   CYSTOSCOPY WITH STENT PLACEMENT Left 10/17/2021   Procedure: CYSTOSCOPY WITH STENT PLACEMENT;  Surgeon: Abbie Sons, MD;  Location: [facility] ORS;  Service: Urology;  Laterality: Left;   CYSTOSCOPY WITH URETEROSCOPY, STONE BASKETRY AND STENT PLACEMENT Left 10/16/2021   Procedure: CYSTOSCOPY WITH URETEROSCOPY  AND STENT REMOVAL;  Surgeon: Abbie Sons, MD;  Location: [facility] ORS;  Service: Urology;  Laterality: Left;   ENTEROSCOPY N/A 08/17/2021   Procedure: ENTEROSCOPY;  Surgeon: Lin Landsman, MD;  Location: [facility] ENDOSCOPY;  Service: Gastroenterology;  Laterality: N/A;   ESOPHAGOGASTRODUODENOSCOPY N/A 06/21/2021   Procedure: ESOPHAGOGASTRODUODENOSCOPY (EGD);  Surgeon: Toledo, Benay Pike, MD;  Location: [facility] ENDOSCOPY;  Service: Gastroenterology;  Laterality: N/A;   ESOPHAGOGASTRODUODENOSCOPY (EGD) WITH PROPOFOL N/A 08/17/2021   Procedure: ESOPHAGOGASTRODUODENOSCOPY (EGD) WITH PROPOFOL;  Surgeon: Lin Landsman, MD;  Location: [facility] ENDOSCOPY;  Service: Gastroenterology;  Laterality: N/A;   ESOPHAGOGASTRODUODENOSCOPY (EGD) WITH PROPOFOL N/A 10/14/2021   Procedure: ESOPHAGOGASTRODUODENOSCOPY (EGD) WITH PROPOFOL;  Surgeon: [name], MD;  Location: [facility] ENDOSCOPY;  Service: Endoscopy;  Laterality: N/A;   GIVENS CAPSULE STUDY N/A 06/22/2021   Procedure: GIVENS CAPSULE STUDY;  Surgeon: Toledo, Benay Pike, MD;  Location: [facility] ENDOSCOPY;  Service: Gastroenterology;  Laterality: N/A;   IVC FILTER INSERTION N/A 08/18/2020   Procedure: IVC FILTER INSERTION;  Surgeon: Katha Cabal, MD;  Location: [place] [facility];  Service: Cardiovascular;  Laterality: N/A;   PERIPHERAL VASCULAR THROMBECTOMY Bilateral 01/03/2021   Procedure: PERIPHERAL VASCULAR  THROMBECTOMY;  Surgeon: Algernon Huxley, MD;  Location: [place] [facility];  Service: Cardiovascular;  Laterality: Bilateral;   PULMONARY THROMBECTOMY N/A 08/18/2020   Procedure: PULMONARY THROMBECTOMY;  Surgeon: Katha Cabal, MD;  Location: [place] [facility];  Service: Cardiovascular;  Laterality: N/A;   TUBAL LIGATION     VISCERAL ANGIOGRAPHY N/A 07/18/2021   Procedure: VISCERAL ANGIOGRAPHY;  Surgeon: Algernon Huxley, MD;  Location: [place] [facility];  Service: Cardiovascular;  Laterality: N/A;    Home Medications:  No outpatient medications have been marked as taking for the 12/24/21 encounter [facility] Encounter).    Allergies: No Known Allergies  Family History  Problem Relation Age of Onset   Hypertension Mother    Diabetes Mother    Diabetes Father    Hypertension Father    High Cholesterol Father    Congestive Heart Failure Father    Breast cancer Cousin     Social History:  reports that she has never smoked. She has never used smokeless tobacco. She reports that she does not currently use  alcohol. She reports that she does not currently use drugs.  ROS: A complete review of systems was performed.  All systems are negative except for pertinent findings as noted.  Physical Exam:  Vital signs in last 24 hours: Temp:  [97.5 F (36.4 C)-99.4 F (37.4 C)] 97.5 F (36.4 C) (06/22 0820) Pulse Rate:  [82-95] 89 (06/22 0820) Resp:  [16-18] 16 (06/22 0820) BP: (96-147)/(42-83) 98/55 (06/22 0820) SpO2:  [91 %-96 %] 91 % (06/22 0820) Constitutional:  Alert and oriented, no acute distress HEENT: [place] AT, moist mucus membranes Cardiovascular: No clubbing, cyanosis, or edema Respiratory: Normal respiratory effort Skin: No rashes, bruises or suspicious lesions Neurologic: Grossly intact, no focal deficits, moving all 4 extremities Psychiatric: Normal mood and affect  Laboratory Data:  Recent Labs    12/25/21 0316 12/26/21 0323 12/26/21 1513 12/27/21 0357  WBC 6.1 4.5   --  4.7  HGB 7.4* 6.9* 8.7* 7.6*  HCT 25.3* 23.9* 28.7* 24.9*   Recent Labs    12/25/21 0316 12/26/21 0323 12/27/21 0357  NA 140 140 139  K 3.5 3.7 3.9  CL 109 110 111  CO2 '27 27 25  '$ GLUCOSE 124* 119* 112*  BUN '17 15 15  '$ CREATININE 0.62 0.69 0.67  CALCIUM 8.7* 8.6* 8.5*   Urinalysis    Component Value Date/Time   COLORURINE RED (A) 12/24/2021 1753   APPEARANCEUR TURBID (A) 12/24/2021 1753   APPEARANCEUR Hazy (A) 12/14/2021 0854   LABSPEC 1.018 12/24/2021 1753   PHURINE  12/24/2021 1753    TEST NOT REPORTED DUE TO COLOR INTERFERENCE OF URINE PIGMENT   GLUCOSEU (A) 12/24/2021 1753    TEST NOT REPORTED DUE TO COLOR INTERFERENCE OF URINE PIGMENT   HGBUR (A) 12/24/2021 1753    TEST NOT REPORTED DUE TO COLOR INTERFERENCE OF URINE PIGMENT   BILIRUBINUR (A) 12/24/2021 1753    TEST NOT REPORTED DUE TO COLOR INTERFERENCE OF URINE PIGMENT   BILIRUBINUR Negative 12/14/2021 0854   KETONESUR (A) 12/24/2021 1753    TEST NOT REPORTED DUE TO COLOR INTERFERENCE OF URINE PIGMENT   PROTEINUR (A) 12/24/2021 1753    TEST NOT REPORTED DUE TO COLOR INTERFERENCE OF URINE PIGMENT   NITRITE (A) 12/24/2021 1753    TEST NOT REPORTED DUE TO COLOR INTERFERENCE OF URINE PIGMENT   LEUKOCYTESUR (A) 12/24/2021 1753    TEST NOT REPORTED DUE TO COLOR INTERFERENCE OF URINE PIGMENT   Results for orders placed or performed during the hospital encounter of 12/24/21  Urine Culture     Status: Abnormal (Preliminary result)   Collection Time: 12/24/21  5:53 PM   Specimen: Urine, Clean Catch  Result Value Ref Range Status   Specimen Description   Final    URINE, CLEAN CATCH Performed at [facility], [address] [place], [place] [address]    Special Requests   Final    NONE Performed at [facility], [address] [place], [place] [address]    Culture (A)  Final    30,000 COLONIES/mL ENTEROCOCCUS FAECIUM SUSCEPTIBILITIES TO FOLLOW Performed at [facility], [address], [place],  [address]    Report Status PENDING  Incomplete    Radiologic Imaging: US Venous Img Lower Bilateral (DVT)  Result Date: 12/25/2021 CLINICAL DATA:  Bilateral leg pain and swelling. History of DVT and patient has an IVC filter. EXAM: BILATERAL LOWER EXTREMITY VENOUS DOPPLER ULTRASOUND TECHNIQUE: Gray-scale sonography with graded compression, as well as color Doppler and duplex ultrasound were  performed to evaluate the lower extremity deep venous systems from the level of the common femoral vein and including the common femoral, femoral, profunda femoral, popliteal and calf veins including the posterior tibial, peroneal and gastrocnemius veins when visible. The superficial great saphenous vein was also interrogated. Spectral Doppler was utilized to evaluate flow at rest and with distal augmentation maneuvers in the common femoral, femoral and popliteal veins. COMPARISON:  Lower extremity venous duplex 11/17/2021 FINDINGS: RIGHT LOWER EXTREMITY Common Femoral Vein: Again noted is incomplete compressibility of the right common femoral vein. Color Doppler flow within the right common femoral vein. Findings likely represent the sequelae of prior DVT. Saphenofemoral Junction: No evidence of thrombus. Normal color Doppler flow. Profunda Femoral Vein: Again noted is incomplete compressibility with color Doppler flow. Femoral Vein: Again noted is incomplete compressibility in the right femoral vein with echogenic wall thickening. There is color Doppler flow in the right femoral vein. Findings are most compatible with sequela of prior DVT. Popliteal Vein: Again noted is partial compressibility in the popliteal vein. There is color Doppler flow. Calf Veins: Limited evaluation. Other Findings:  None. LEFT LOWER EXTREMITY Common Femoral Vein: Again noted is echogenic wall thickening and partial compressibility of the left common femoral vein compatible with changes from prior DVT. Color Doppler flow in  the left common femoral vein. Saphenofemoral Junction: No evidence of thrombus. Normal color Doppler flow. Profunda Femoral Vein: Again noted is partial compressibility with color Doppler flow. Femoral Vein: Again noted is incomplete compressibility of the left femoral vein with some color Doppler flow. Popliteal Vein: Incomplete compressibility with color Doppler flow. Calf Veins: Limited evaluation. Other Findings:  None. IMPRESSION: 1. Stable venous duplex of the bilateral lower extremities. Again noted is incomplete compressibility of the bilateral common femoral veins, profunda femoral veins, femoral veins and popliteal veins. Findings are most compatible with the sequelae of previous DVT. No evidence to suggest acute thrombosis. 2. Limited evaluation of the deep calf veins bilaterally. Electronically Signed   By: Markus Daft M.D.   On: 12/25/2021 10:41    Assessment & Plan:  66 year old comorbid female with a history of chronic left hydronephrosis and recurrent septic shock managed with indwelling left ureteral stent, now with recurrent gross hematuria and blood loss anemia.  Admission UA also concerning for possible UTI, on empiric antibiotics.  We discussed today that we believe her left ureteral stent is contributing to her gross hematuria, especially given her recent findings of bladder neck bleeding likely due to radiation cystitis under cystoscopy with Dr. Jeffie Pollock.  Is likely that the stent is rubbing up against this spot and worsening her bleeding in the setting of Xarelto use.  Given that she needs to remain on Xarelto, it is reasonable to assume that she is going to continue to have clinically significant gross hematuria while her left ureteral stent is placed, which would necessitate repeat transfusions and increase her risk of readmission.  We discussed that the safest option at this point to reduce her risk for further bleeding is converting her from a left ureteral stent to a left percutaneous  nephrostomy tube.  She had previously declined this, however she states today that she understands that this is the prudent decision given her ongoing hematuria.  She makes her own medical decisions and consents to proceed with left nephrostomy tube placement at this time.  Per patient request, I have attempted to contact her daughter Kyra Manges 3 times this morning but was unsuccessful.  It appears her line  is disconnected.    I contacted IR and placed orders for left PCN placement today.  I have placed orders for her to be NPO and instructed her to not consume her breakfast if and when it arrives.  Recommendations: -Continue to hold Xarelto -Keep n.p.o. in advance of procedure (order placed and nursing notified) -Left PCN placement with IR today -Outpatient cysto stent removal with Dr. Bernardo Heater  Thank you for involving me in this patient's care, I will continue to follow along.  Debroah Loop, PA-C 12/27/2021 8:47 AM

## 2021-12-27 NOTE — Progress Notes (Signed)
  Progress Note   Patient: Courtney Mack GBT:[id number] DOB: 10/14/1955 DOA: 12/24/2021     1 DOS: the patient was seen and examined on 12/27/2021   Brief hospital course: [name] is a 66 year old female with history of iron deficiency anemia, chronic lymphedema bilaterally, history of PE on Xarelto, hypertension, endometrial cancer, gastric antral vascular ectasia, who presents to the emergency department for chief concerns of bleeding.  Initial vitals in the emergency department showed temperature of 98.4, respiration rate of 20, heart rate of 88, blood pressure 135/67, SPO2 of 97% on room air.  Serum sodium is 137, potassium 3.8, chloride of 104, bicarb 27, nonfasting blood glucose 108, BUN of 18, serum creatinine of 0.73, GFR greater than 60, WBC 3.8, hemoglobin 8.0, platelets of 249.  UA was ordered however leukocytes and nitrates were not able to be reported due to color interference.  ED treatment: None  Assessment and Plan: * Hematuria -after reviewing past records, pt did have hematuria in the past.  Pt has had bladder neck bleeding that appeared consistent with radiation cystitis s/p recent Fulguration.  Currently has a left stent, thought to be the cause of hematuria Plan: - Continue to hold Xarelto  --cont Foley with irrigation --monitor Hgb --stent removal as outpatient as soon as possible  Hydronephrosis, left --Chronic --CT showed left hydronephrosis with left ureteral stent in place.  No obstructing stones identified.  Per urology, pt may have ureteral stricture.  When stent was removed in the past, pt ended up presenting with septic shock. --urology consulted Plan: --left nephrostomy tube today --need stent removal as outpatient as soon as possible  UTI (urinary tract infection) --no dysuria, however, given hematuria, reasonable to cover for UTI --started on ceftriaxone, however, urine cx was not ordered on admission.  Added-on urine cx pos for  30,000 COLONIES/mL ENTEROCOCCUS FAECIUM --treated with fosfomycin x1   IDA (iron deficiency anemia) --cont home iron supplement  Blood loss anemia --currently blood loss is from hematuria. Plan: --1u pRBC for Hgb 6.9  History of pulmonary embolus (PE) Recurrent DVT - Recurrent DVT and massive PE in February 2022 in June 2022, status post thrombectomy and IVC filter placement, on Xarelto - Xarelto has been stopped by patient since 12/22/2021 - Per chart review patient had [facility] IVC filter insertion on 08/18/2020 Plan: --cont to hold Xarelto  HLD (hyperlipidemia) - cont home rosuvastatin 10 mg q. daily  Dyslipidemia - Rosuvastatin  Morbidly obese ([place]) - This meets criteria for morbid obesity based on the presence of 1 or more chronic comorbidities. Patient has hypertension and hyperlipidemia with BMI of 36.3. This complicates overall care and prognosis.         Subjective:  Went for left nephro tube today.  Still has hematuria.     Physical Exam:  Constitutional: NAD, AAOx3 HEENT: conjunctivae and lids normal, EOMI CV: No cyanosis.   RESP: normal respiratory effort, on RA Neuro: II - XII grossly intact.   Psych: Normal mood and affect.  Appropriate judgement and reason Foley with red urine   Data Reviewed:  Family Communication:   Disposition: Status is: inpatient   Planned Discharge Destination: Home    Time spent: 35 minutes  Author: Enzo Bi, MD 12/27/2021 7:07 PM  For on call review [email or web address].

## 2021-12-27 NOTE — Procedures (Signed)
Vascular and Interventional Radiology Procedure Note  Patient: Courtney Mack DOB: [birth year] Medical Record Number: [id number] Note Date/Time: 12/27/21 1:17 PM   Performing Physician: Michaelle Birks, MD Assistant(s): None  Diagnosis: Left hydronephrosis despite ureteral stent  Procedure:  LEFT  NEPHROSTOMY TUBE PLACEMENT  Anesthesia: Conscious Sedation Complications: None Estimated Blood Loss: Minimal Specimens:  None  Findings:  Successful placement of left-sided, 10 F nephrostomy tube into the left kidney(s).   Plan: Flush PCN w 5 mL and record drain output qShift. Follow up for routine nephrostomy tube exchange in 2 month(s).   See detailed procedure note with images in PACS. The patient tolerated the procedure well without incident or complication and was returned to Floor Bed in stable condition.    Michaelle Birks, MD [facility] Inter[facility] Radiology   Pager. [place]

## 2021-12-27 NOTE — TOC Initial Note (Signed)
Transition of Care [facility]) - Initial/Assessment Note    Patient Details  Name: Courtney Mack MRN: [id number] Date of Birth: 11-10-1955  Transition of Care [facility]) CM/SW Contact:    Chapman Fitch, RN Phone Number: 12/27/2021, 2:28 PM  Clinical Narrative:                 Admitted for: Hematuria Admitted from:Home with daughter HYQ:MVHQION [place] Pharmacy:[facility] Current home health/prior home health/DME:[facility], [facility], [facility]  Nephrostomy tube placed today Outpatient palliative pending per note Patient agreeable to home health services. States she does not have a preference of home health agency.  Patient recently closed with [facility].  Referral made and accepted by [facility].     Expected Discharge Plan: Home w Home Health Services Barriers to Discharge: Continued Medical Work up   Patient Goals and CMS Choice   CMS Medicare.gov Compare Post Acute Care list provided to:: Patient    Expected Discharge Plan and Services Expected Discharge Plan: Home w Home Health Services   Discharge Planning Services: CM Consult Post Acute Care Choice: Home Health Living arrangements for the past 2 months: Single Family Home                           HH Arranged: PT, OT, RN [facility] Agency: [facility] Date HH Agency Contacted: 12/27/21   Representative spoke with at [facility] Agency: [name]  Prior Living Arrangements/Services Living arrangements for the past 2 months: Single Family Home Lives with:: Adult Children Patient language and need for interpreter reviewed:: Yes Do you feel safe going back to the place where you live?: Yes      Need for Family Participation in Patient Care: Yes (Comment) Care giver support system in place?: Yes (comment) Current home services: DME Criminal Activity/Legal Involvement Pertinent to Current Situation/Hospitalization: No - Comment as needed  Activities of Daily Living Home Assistive Devices/Equipment: Dan Humphreys  (specify type) (wheeled walker with a seat) ADL Screening (condition at time of admission) Patient's cognitive ability adequate to safely complete daily activities?: Yes Is the patient deaf or have difficulty hearing?: Yes (on the right ear, per patient) Does the patient have difficulty seeing, even when wearing glasses/contacts?: Yes Does the patient have difficulty concentrating, remembering, or making decisions?: No Patient able to express need for assistance with ADLs?: Yes Does the patient have difficulty dressing or bathing?: Yes Independently performs ADLs?: Yes (appropriate for developmental age) Communication: Independent with device (comment) Dressing (OT): Independent with device (comment) Grooming: Independent with device (comment) Feeding: Independent with device (comment) Bathing: Independent with device (comment) Toileting: Independent with device (comment) In/Out Bed: Independent with device (comment) Walks in Home: Independent with device (comment) Does the patient have difficulty walking or climbing stairs?: Yes Weakness of Legs: None Weakness of Arms/Hands: None  Permission Sought/Granted                  Emotional Assessment       Orientation: : Oriented to Self, Oriented to Place, Oriented to  Time, Oriented to Situation Alcohol / Substance Use: Not Applicable Psych Involvement: No (comment)  Admission diagnosis:  Gross hematuria [R31.0] Hematuria [R31.9] Patient Active Problem List   Diagnosis Date Noted   Blood loss anemia 12/26/2021   Hematuria 12/24/2021   History of pulmonary embolus (PE) 12/24/2021   Chronic radiation cystitis 11/17/2021   CHF (congestive heart failure) (HCC) 11/16/2021   Gross hematuria 11/16/2021   Abdominal pain 11/16/2021  Hydronephrosis, left    Sepsis with hypotension (HCC) 10/17/2021   Gastric antral vascular ectasia    Anemia due to GI blood loss, symptomatic with fatigue/lightheadedness and shortness of breath  10/12/2021   UTI (urinary tract infection) 09/13/2021   Anemia 09/13/2021   Acute on chronic blood loss anemia 07/18/2021   Sepsis (HCC) 06/20/2021   HLD (hyperlipidemia) 06/20/2021   Chronic diastolic CHF (congestive heart failure) (HCC) 06/20/2021   Risk factors for obstructive sleep apnea 11/10/2020   Uterine cancer (HCC) 11/08/2020   IDA (iron deficiency anemia) 09/21/2020   Endometrial cancer (HCC) 09/13/2020   Acute blood loss anemia 09/06/2020   Vaginal bleeding 09/06/2020   Hx of blood clots 09/06/2020   Anemia, unspecified 08/24/2020   Morbidly obese (HCC) 08/23/2020   Diabetes mellitus without complication (HCC) 08/23/2020   Dyslipidemia 08/23/2020   Vaginal bleeding, abnormal 08/18/2020   Morbid obesity (HCC) 08/18/2020   PCP:  [facility]:   [id number] [place], [place] - [address] AT [facility] OF [address] [place] [address] Phone: [phone] Fax: [phone]     Social Determinants of Health (SDOH) Interventions    Readmission Risk Interventions    12/27/2021    2:26 PM 11/18/2021    9:04 AM 10/18/2021   11:23 AM  Readmission Risk Prevention Plan  Transportation Screening Complete Complete Complete  Medication Review (RN Care Manager) Complete Complete Complete  PCP or Specialist appointment within 3-5 days of discharge  Complete Complete  HRI or Home Care Consult Complete Complete Complete  SW Recovery Care/Counseling Consult Complete Complete Complete  Palliative Care Screening Complete Complete Complete  Skilled Nursing Facility Not Applicable Complete Complete

## 2021-12-28 DIAGNOSIS — D509 Iron deficiency anemia, unspecified: Secondary | ICD-10-CM | POA: Diagnosis not present

## 2021-12-28 DIAGNOSIS — R310 Gross hematuria: Secondary | ICD-10-CM | POA: Diagnosis not present

## 2021-12-28 LAB — BASIC METABOLIC PANEL
Anion gap: 4 — ABNORMAL LOW (ref 5–15)
BUN: 14 mg/dL (ref 8–23)
CO2: 25 mmol/L (ref 22–32)
Calcium: 8.4 mg/dL — ABNORMAL LOW (ref 8.9–10.3)
Chloride: 111 mmol/L (ref 98–111)
Creatinine, Ser: 0.76 mg/dL (ref 0.44–1.00)
GFR, Estimated: >60 mL/min (ref >60)
Glucose, Bld: 113 mg/dL — ABNORMAL HIGH (ref 70–99)
Potassium: 4.3 mmol/L (ref 3.5–5.1)
Sodium: 140 mmol/L (ref 135–145)

## 2021-12-28 LAB — TYPE AND SCREEN
ABO/RH(D): A POS
Antibody Screen: POSITIVE
Donor AG Type: NEGATIVE
Unit division: 0
Unit division: 0
Unit division: 0

## 2021-12-28 LAB — BPAM RBC
Blood Product Expiration Date: 202307022359
Blood Product Expiration Date: 202307062359
Blood Product Expiration Date: 202307162359
ISSUE DATE / TIME: 202306211036
Unit Type and Rh: 6200
Unit Type and Rh: 6200
Unit Type and Rh: 6200

## 2021-12-28 LAB — CBC
HCT: 26.9 % — ABNORMAL LOW (ref 36.0–46.0)
Hemoglobin: 8.2 g/dL — ABNORMAL LOW (ref 12.0–15.0)
MCH: 27.7 pg (ref 26.0–34.0)
MCHC: 30.5 g/dL (ref 30.0–36.0)
MCV: 90.9 fL (ref 80.0–100.0)
Platelets: 218 10*3/uL (ref 150–400)
RBC: 2.96 MIL/uL — ABNORMAL LOW (ref 3.87–5.11)
RDW: 18.3 % — ABNORMAL HIGH (ref 11.5–15.5)
WBC: 6 10*3/uL (ref 4.0–10.5)
nRBC: 0 % (ref 0.0–0.2)

## 2021-12-28 LAB — MAGNESIUM: Magnesium: 2.1 mg/dL (ref 1.7–2.4)

## 2021-12-28 MED ORDER — FOSFOMYCIN TROMETHAMINE 3 G PO PACK
3.0000 g | PACK | Freq: Once | ORAL | Status: AC
Start: 2021-12-28 — End: 2021-12-28
  Administered 2021-12-28: 3 g via ORAL
  Filled 2021-12-28: qty 3

## 2021-12-28 NOTE — Progress Notes (Signed)
  Progress Note   Patient: Courtney Mack ZOX:[id number] DOB: 02-05-1956 DOA: 12/24/2021     2 DOS: the patient was seen and examined on 12/28/2021   Brief hospital course: Ms. Braeden Koep is a 66 year old female with history of iron deficiency anemia, chronic lymphedema bilaterally, history of PE on Xarelto, hypertension, endometrial cancer, gastric antral vascular ectasia, who presents to the emergency department for chief concerns of bleeding.  Initial vitals in the emergency department showed temperature of 98.4, respiration rate of 20, heart rate of 88, blood pressure 135/67, SPO2 of 97% on room air.  Serum sodium is 137, potassium 3.8, chloride of 104, bicarb 27, nonfasting blood glucose 108, BUN of 18, serum creatinine of 0.73, GFR greater than 60, WBC 3.8, hemoglobin 8.0, platelets of 249.  UA was ordered however leukocytes and nitrates were not able to be reported due to color interference.  ED treatment: None  Assessment and Plan: * Hematuria -after reviewing past records, pt did have hematuria in the past.  Pt has had bladder neck bleeding that appeared consistent with radiation cystitis s/p recent Fulguration.  Currently has a left stent, thought to be the cause of hematuria Plan: - Continue to hold Xarelto  --cont Foley with irrigation --monitor Hgb --stent removal as outpatient as soon as possible  Hydronephrosis, left --Chronic --CT showed left hydronephrosis with left ureteral stent in place.  No obstructing stones identified.  Per urology, pt may have ureteral stricture.  When stent was removed in the past, pt ended up presenting with septic shock. --urology consulted --left nephrostomy tube on 6/22 Plan: --need stent removal as outpatient as soon as possible  UTI (urinary tract infection) --no dysuria, however, given hematuria, reasonable to cover for UTI --started on ceftriaxone, however, urine cx was not ordered on admission.  Added-on urine cx pos for  30,000 COLONIES/mL ENTEROCOCCUS FAECIUM --treated with fosfomycin x1  --2nd and final dose of fosfomycin today  IDA (iron deficiency anemia) --cont home iron supplement  Blood loss anemia --currently blood loss is from hematuria. Plan: --s/p 1u pRBC for Hgb 6.9  History of pulmonary embolus (PE) Recurrent DVT - Recurrent DVT and massive PE in February 2022 in June 2022, status post thrombectomy and IVC filter placement, on Xarelto - Xarelto has been stopped by patient since 12/22/2021 - Per chart review patient had [facility] IVC filter insertion on 08/18/2020 Plan: --cont to hold Xarelto  HLD (hyperlipidemia) - cont home rosuvastatin 10 mg q. daily  Dyslipidemia - Rosuvastatin  Morbidly obese (HCC) - This meets criteria for morbid obesity based on the presence of 1 or more chronic comorbidities. Patient has hypertension and hyperlipidemia with BMI of 36.3. This complicates overall care and prognosis.         Subjective:  Pain from nephrostomy tube insertion improved today.  Still having hematuria.  Hgb stable.   Physical Exam:  Constitutional: NAD, AAOx3 HEENT: conjunctivae and lids normal, EOMI CV: No cyanosis.   RESP: normal respiratory effort, on RA Extremities: edema in BLE SKIN: warm, dry Neuro: II - XII grossly intact.   Psych: depressed mood and affect.  Appropriate judgement and reason  Foley with pink urine   Data Reviewed:  Family Communication:   Disposition: Status is: inpatient   Planned Discharge Destination: Home    Time spent: 35 minutes  Author: Darlin Priestly, MD 12/28/2021 7:26 PM  For on call review [email or web address].

## 2021-12-29 DIAGNOSIS — N390 Urinary tract infection, site not specified: Secondary | ICD-10-CM | POA: Diagnosis not present

## 2021-12-29 DIAGNOSIS — R310 Gross hematuria: Secondary | ICD-10-CM | POA: Diagnosis not present

## 2021-12-29 DIAGNOSIS — N1330 Unspecified hydronephrosis: Secondary | ICD-10-CM | POA: Diagnosis not present

## 2021-12-29 LAB — BASIC METABOLIC PANEL
Anion gap: 4 — ABNORMAL LOW (ref 5–15)
BUN: 14 mg/dL (ref 8–23)
CO2: 25 mmol/L (ref 22–32)
Calcium: 8.6 mg/dL — ABNORMAL LOW (ref 8.9–10.3)
Chloride: 107 mmol/L (ref 98–111)
Creatinine, Ser: 0.71 mg/dL (ref 0.44–1.00)
GFR, Estimated: >60 mL/min (ref >60)
Glucose, Bld: 104 mg/dL — ABNORMAL HIGH (ref 70–99)
Potassium: 4.2 mmol/L (ref 3.5–5.1)
Sodium: 136 mmol/L (ref 135–145)

## 2021-12-29 LAB — MAGNESIUM: Magnesium: 2 mg/dL (ref 1.7–2.4)

## 2021-12-29 LAB — CBC
HCT: 29.2 % — ABNORMAL LOW (ref 36.0–46.0)
Hemoglobin: 8.7 g/dL — ABNORMAL LOW (ref 12.0–15.0)
MCH: 27.7 pg (ref 26.0–34.0)
MCHC: 29.8 g/dL — ABNORMAL LOW (ref 30.0–36.0)
MCV: 93 fL (ref 80.0–100.0)
Platelets: 207 10*3/uL (ref 150–400)
RBC: 3.14 MIL/uL — ABNORMAL LOW (ref 3.87–5.11)
RDW: 17.6 % — ABNORMAL HIGH (ref 11.5–15.5)
WBC: 3.8 10*3/uL — ABNORMAL LOW (ref 4.0–10.5)
nRBC: 0 % (ref 0.0–0.2)

## 2021-12-29 NOTE — Progress Notes (Signed)
  Progress Note   Patient: Courtney Mack ZOX:[id number] DOB: Nov 12, 1955 DOA: 12/24/2021     3 DOS: the patient was seen and examined on 12/29/2021   Brief hospital course: [name] is a 66 year old female with history of iron deficiency anemia, chronic lymphedema bilaterally, history of PE on Xarelto, hypertension, endometrial cancer, gastric antral vascular ectasia, who presents to the emergency department for chief concerns of bleeding.  Initial vitals in the emergency department showed temperature of 98.4, respiration rate of 20, heart rate of 88, blood pressure 135/67, SPO2 of 97% on room air.  Serum sodium is 137, potassium 3.8, chloride of 104, bicarb 27, nonfasting blood glucose 108, BUN of 18, serum creatinine of 0.73, GFR greater than 60, WBC 3.8, hemoglobin 8.0, platelets of 249.  UA was ordered however leukocytes and nitrates were not able to be reported due to color interference.  ED treatment: None  Assessment and Plan: * Hematuria -after reviewing past records, pt did have hematuria in the past.  Pt has had bladder neck bleeding that appeared consistent with radiation cystitis s/p recent Fulguration.  Currently has a left stent, thought to be the cause of hematuria --hematuria has been persistent even after 1 week of holding Xarelto Plan: - Continue to hold Xarelto  --cont Foley with irrigation --monitor Hgb --stent removal as outpatient as soon as possible  Hydronephrosis, left --Chronic --CT showed left hydronephrosis with left ureteral stent in place.  No obstructing stones identified.  Per urology, pt may have ureteral stricture.  When stent was removed in the past, pt ended up presenting with septic shock. --urology consulted --left nephrostomy tube on 6/22 Plan: --need stent removal as outpatient as soon as possible  UTI (urinary tract infection) --no dysuria, however, given hematuria, reasonable to cover for UTI --started on ceftriaxone,  however, urine cx was not ordered on admission.  Added-on urine cx pos for 30,000 COLONIES/mL ENTEROCOCCUS FAECIUM --completed abx treatment with fosfomycin x2  IDA (iron deficiency anemia) --cont home iron supplement  Blood loss anemia --currently blood loss is from hematuria. Plan: --s/p 1u pRBC for Hgb 6.9  History of pulmonary embolus (PE) Recurrent DVT - Recurrent DVT and massive PE in February 2022 in June 2022, status post thrombectomy and IVC filter placement, on Xarelto - Xarelto has been stopped by patient since 12/22/2021 - Per chart review patient had [facility] IVC filter insertion on 08/18/2020 Plan: --cont to hold Xarelto  HLD (hyperlipidemia) - cont home rosuvastatin 10 mg q. daily  Dyslipidemia - Rosuvastatin  Morbidly obese (HCC) - This meets criteria for morbid obesity based on the presence of 1 or more chronic comorbidities. Patient has hypertension and hyperlipidemia with BMI of 36.3. This complicates overall care and prognosis.         Subjective:  Urine was running clear this morning, but became bloody again around noon.  Hgb however has mildly improved  Pt got up and walked today.     Physical Exam:  Constitutional: NAD, AAOx3 HEENT: conjunctivae and lids normal, EOMI CV: No cyanosis.   RESP: normal respiratory effort, on RA Extremities: edema in BLE SKIN: warm, dry Neuro: II - XII grossly intact.   Psych: subdued mood and affect.   Foley with red urine   Data Reviewed:  Family Communication:   Disposition: Status is: inpatient   Planned Discharge Destination: Home    Time spent: 35 minutes  Author: Darlin Priestly, MD 12/29/2021 6:13 PM  For on call review [email or web address].

## 2021-12-29 NOTE — Progress Notes (Signed)
Chaplain responded to A.D call. Pt. requested paperwork/information regarding A.D. Pt. will get paperwork notarized outside of hospital since she is being discharged. Please call Chaplain if needed/questions.

## 2021-12-30 DIAGNOSIS — N390 Urinary tract infection, site not specified: Secondary | ICD-10-CM | POA: Diagnosis not present

## 2021-12-30 DIAGNOSIS — N1330 Unspecified hydronephrosis: Secondary | ICD-10-CM | POA: Diagnosis not present

## 2021-12-30 DIAGNOSIS — R310 Gross hematuria: Secondary | ICD-10-CM | POA: Diagnosis not present

## 2021-12-30 LAB — CBC
HCT: 26.5 % — ABNORMAL LOW (ref 36.0–46.0)
Hemoglobin: 8.1 g/dL — ABNORMAL LOW (ref 12.0–15.0)
MCH: 27.9 pg (ref 26.0–34.0)
MCHC: 30.6 g/dL (ref 30.0–36.0)
MCV: 91.4 fL (ref 80.0–100.0)
Platelets: 223 10*3/uL (ref 150–400)
RBC: 2.9 MIL/uL — ABNORMAL LOW (ref 3.87–5.11)
RDW: 17.2 % — ABNORMAL HIGH (ref 11.5–15.5)
WBC: 4.5 10*3/uL (ref 4.0–10.5)
nRBC: 0 % (ref 0.0–0.2)

## 2021-12-30 LAB — BASIC METABOLIC PANEL
Anion gap: 3 — ABNORMAL LOW (ref 5–15)
BUN: 14 mg/dL (ref 8–23)
CO2: 24 mmol/L (ref 22–32)
Calcium: 8.5 mg/dL — ABNORMAL LOW (ref 8.9–10.3)
Chloride: 110 mmol/L (ref 98–111)
Creatinine, Ser: 0.66 mg/dL (ref 0.44–1.00)
GFR, Estimated: >60 mL/min (ref >60)
Glucose, Bld: 135 mg/dL — ABNORMAL HIGH (ref 70–99)
Potassium: 4.1 mmol/L (ref 3.5–5.1)
Sodium: 137 mmol/L (ref 135–145)

## 2021-12-30 LAB — MAGNESIUM: Magnesium: 1.9 mg/dL (ref 1.7–2.4)

## 2021-12-30 NOTE — Assessment & Plan Note (Signed)
--  currently blood loss is from hematuria. --s/p 1u pRBC for Hgb 6.9

## 2021-12-30 NOTE — Progress Notes (Addendum)
  Progress Note   Patient: Courtney Mack:[id number] DOB: Sep 04, 1955 DOA: 12/24/2021     4 DOS: the patient was seen and examined on 12/30/2021   Brief hospital course: [name] is a 66 year old female with history of iron deficiency anemia, chronic lymphedema bilaterally, history of PE on Xarelto, hypertension, endometrial cancer, gastric antral vascular ectasia, who presents to the emergency department for chief concerns of bleeding.  Initial vitals in the emergency department showed temperature of 98.4, respiration rate of 20, heart rate of 88, blood pressure 135/67, SPO2 of 97% on room air.  Serum sodium is 137, potassium 3.8, chloride of 104, bicarb 27, nonfasting blood glucose 108, BUN of 18, serum creatinine of 0.73, GFR greater than 60, WBC 3.8, hemoglobin 8.0, platelets of 249.  UA was ordered however leukocytes and nitrates were not able to be reported due to color interference.  ED treatment: None  Assessment and Plan: * Hematuria -after reviewing past records, pt did have hematuria in the past.  Pt has had bladder neck bleeding that appeared consistent with radiation cystitis s/p recent Fulguration.  Currently has a left stent, thought to be the cause of hematuria --hematuria has been persistent even after 1 week of holding Xarelto Plan: - Continue to hold Xarelto  --cont Foley with irrigation --monitor Hgb --stent removal as outpatient as soon as possible  Hydronephrosis, left --Chronic --CT showed left hydronephrosis with left ureteral stent in place.  No obstructing stones identified.  Per urology, pt may have ureteral stricture.  When stent was removed in the past, pt ended up presenting with septic shock. --urology consulted --left nephrostomy tube on 6/22 Plan:  --L PCN to gravity. Exchange in 2 months. --need stent removal as outpatient as soon as possible  UTI (urinary tract infection) --no dysuria, however, given hematuria, reasonable to  cover for UTI --started on ceftriaxone, however, urine cx was not ordered on admission.  Added-on urine cx pos for 30,000 COLONIES/mL ENTEROCOCCUS FAECIUM --completed abx treatment with fosfomycin x2  IDA (iron deficiency anemia) --cont home iron supplement  History of pulmonary embolus (PE) Recurrent DVT - Recurrent DVT and massive PE in February 2022 in June 2022, status post thrombectomy and IVC filter placement, on Xarelto - Xarelto has been stopped by patient since 12/22/2021 - Per chart review patient had [facility] IVC filter insertion on 08/18/2020 Plan: --cont to hold Xarelto  HLD (hyperlipidemia) - cont home rosuvastatin 10 mg q. daily  Acute blood loss anemia --currently blood loss is from hematuria. --s/p 1u pRBC for Hgb 6.9  Dyslipidemia - Rosuvastatin  Morbidly obese (HCC) - This meets criteria for morbid obesity based on the presence of 1 or more chronic comorbidities. Patient has hypertension and hyperlipidemia with BMI of 36.3. This complicates overall care and prognosis.         Subjective:  Pt reported some mild pain over LLQ.  Walked today.   Physical Exam:  Constitutional: NAD, AAOx3 HEENT: conjunctivae and lids normal, EOMI CV: No cyanosis.   RESP: normal respiratory effort, on RA Extremities: chronic edema in BLE SKIN: warm, dry Neuro: II - XII grossly intact.   Psych: Normal mood and affect.  Appropriate judgement and reason Foley and nephrostomy bag with clear yellow urine   Data Reviewed:  Family Communication:   Disposition: Status is: inpatient   Planned Discharge Destination: Home    Time spent: 25 minutes  Author: Darlin Priestly, MD 12/30/2021 6:33 PM  For on call review [email or web address].

## 2021-12-31 ENCOUNTER — Encounter: Payer: Self-pay | Admitting: Oncology

## 2021-12-31 ENCOUNTER — Other Ambulatory Visit: Payer: Self-pay | Admitting: Interventional Radiology

## 2021-12-31 ENCOUNTER — Other Ambulatory Visit: Payer: Self-pay

## 2021-12-31 ENCOUNTER — Other Ambulatory Visit (HOSPITAL_COMMUNITY): Payer: Self-pay

## 2021-12-31 DIAGNOSIS — N1330 Unspecified hydronephrosis: Secondary | ICD-10-CM

## 2021-12-31 DIAGNOSIS — R310 Gross hematuria: Secondary | ICD-10-CM | POA: Diagnosis not present

## 2021-12-31 LAB — URINE CULTURE: Culture: >=100000 — AB

## 2021-12-31 LAB — CBC
HCT: 26.3 % — ABNORMAL LOW (ref 36.0–46.0)
Hemoglobin: 7.8 g/dL — ABNORMAL LOW (ref 12.0–15.0)
MCH: 27.1 pg (ref 26.0–34.0)
MCHC: 29.7 g/dL — ABNORMAL LOW (ref 30.0–36.0)
MCV: 91.3 fL (ref 80.0–100.0)
Platelets: 209 10*3/uL (ref 150–400)
RBC: 2.88 MIL/uL — ABNORMAL LOW (ref 3.87–5.11)
RDW: 17 % — ABNORMAL HIGH (ref 11.5–15.5)
WBC: 4.1 10*3/uL (ref 4.0–10.5)
nRBC: 0 % (ref 0.0–0.2)

## 2021-12-31 LAB — BASIC METABOLIC PANEL
Anion gap: 4 — ABNORMAL LOW (ref 5–15)
BUN: 14 mg/dL (ref 8–23)
CO2: 26 mmol/L (ref 22–32)
Calcium: 8.6 mg/dL — ABNORMAL LOW (ref 8.9–10.3)
Chloride: 109 mmol/L (ref 98–111)
Creatinine, Ser: 0.64 mg/dL (ref 0.44–1.00)
GFR, Estimated: >60 mL/min (ref >60)
Glucose, Bld: 117 mg/dL — ABNORMAL HIGH (ref 70–99)
Potassium: 4 mmol/L (ref 3.5–5.1)
Sodium: 139 mmol/L (ref 135–145)

## 2021-12-31 LAB — MAGNESIUM: Magnesium: 1.9 mg/dL (ref 1.7–2.4)

## 2021-12-31 MED ORDER — FUROSEMIDE 20 MG PO TABS: ORAL_TABLET | ORAL | 11 refills | Status: DC

## 2021-12-31 MED ORDER — LINEZOLID 600 MG PO TABS
600.0000 mg | ORAL_TABLET | Freq: Two times a day (BID) | ORAL | 0 refills | Status: AC
  Filled 2021-12-31: qty 14, 7d supply, fill #0

## 2021-12-31 MED ORDER — LINEZOLID 600 MG PO TABS
600.0000 mg | ORAL_TABLET | Freq: Two times a day (BID) | ORAL | Status: DC
  Administered 2021-12-31: 600 mg via ORAL
  Filled 2021-12-31 (×2): qty 1

## 2021-12-31 MED ORDER — LINEZOLID 600 MG PO TABS: 600.0000 mg | ORAL_TABLET | Freq: Two times a day (BID) | ORAL | 0 refills | Status: DC

## 2021-12-31 MED ORDER — RIVAROXABAN 10 MG PO TABS: ORAL_TABLET | ORAL | 0 refills | Status: DC

## 2021-12-31 NOTE — Discharge Summary (Signed)
Physician Discharge Summary   Courtney Mack  female DOB: 1956-03-08  ZOX:[id number]  PCP: [facility]  Admit date: 12/24/2021 Discharge date: 12/31/2021  Admitted From: home Disposition:  home Daughter updated at bedside prior to discharge. CODE STATUS: Full code  Discharge Instructions     Discharge instructions   Complete by: As directed    Please finish taking 7 days of Linezolid at home as directed to treat urinary track infection.  Please follow up with Dr. Lonna Cobb on 6/29 for your stent removal.  He will instruct you on when to resume Xarelto.   Dr. Darlin Priestly - -   No wound care   Complete by: As directed       Hospital Course:  For full details, please see H&P, progress notes, consult notes and ancillary notes.  Briefly,  Courtney Mack is a 66 year old female with history of iron deficiency anemia, chronic lymphedema bilaterally, PE and DVT on Xarelto, hypertension, endometrial cancer, gastric antral vascular ectasia, who presented to the emergency department for chief concerns of blood in the urine.  * Gross Hematuria -after reviewing past records, pt did have hematuria in the past.  Pt has had bladder neck bleeding that appeared consistent with radiation cystitis s/p recent Fulguration.  Currently has a left stent, thought to be the cause of hematuria. --still has intermittent hematuria even after 1 week of holding Xarelto and CBI, however, Hgb remained stable and had not required transfusion since 12/26/21. --Pt was cleared for discharge by urology and will has outpatient f/u with Dr. Lonna Cobb on 6/29 for stent removal. --pt will continue to hold Xarelto until cleared by urology to resume. --Pt decided to leave without the Foley.   Hydronephrosis, left --Chronic --CT showed left hydronephrosis with left ureteral stent in place.  No obstructing stones identified.  Per urology, pt may have ureteral stricture.  When stent was  removed in the past, pt ended up presenting with septic shock. --urology consulted and recommended left nephrostomy tube which was placed on 6/22. --L PCN to gravity. Exchange in 2 months. --need stent removal as outpatient as soon as possible   UTI (urinary tract infection) --no dysuria, however, given hematuria, reasonable to cover for UTI --started on ceftriaxone, however, added-on urine cx pos for 30,000 COLONIES/mL ENTEROCOCCUS FAECIUM.  Pt received fosfomycin x2 per pharm rec which is usually sufficient to cover this organism.  However, urine cx obtained at time of left nephrostomy tube placement returned >=100,000 COLONIES/mL ENTEROCOCCUS FAECIUM.  After discussing with ID pharm, pt was discharged on 7 days of Linezolid.   Acute blood loss anemia --currently blood loss is from hematuria. --s/p 1u pRBC for Hgb 6.9 --Hgb 7.8 prior to discharge.  IDA (iron deficiency anemia) --cont home iron supplement   History of pulmonary embolus (PE) Recurrent DVT - Recurrent DVT and massive PE in February 2022 in June 2022, status post thrombectomy and IVC filter placement, on Xarelto - Xarelto has been stopped by patient since 12/22/2021 due to hematuria - Per chart review patient had [facility] IVC filter insertion on 08/18/2020 --pt will continue to hold Xarelto until cleared by urology to resume.   HLD (hyperlipidemia) - cont home rosuvastatin 10 mg q. daily   Dyslipidemia - Rosuvastatin   Morbidly obese (HCC) - This meets criteria for morbid obesity based on the presence of 1 or more chronic comorbidities. Patient has hypertension and hyperlipidemia with BMI of 36.3. This complicates overall care and prognosis.  Discharge Diagnoses:  Principal Problem:   Hematuria Active Problems:   UTI (urinary tract infection)   Hydronephrosis, left   Endometrial cancer (HCC)   IDA (iron deficiency anemia)   Gastric antral vascular ectasia   Morbid obesity (HCC)   Morbidly obese (HCC)    Dyslipidemia   Acute blood loss anemia   Risk factors for obstructive sleep apnea   HLD (hyperlipidemia)   History of pulmonary embolus (PE)   30 Day Unplanned Readmission Risk Score    Flowsheet Row ED to Hosp-Admission (Current) from 12/24/2021 in [facility] (1A)  30 Day Unplanned Readmission Risk Score (%) 44.97 Filed at 12/31/2021 0801       This score is the patient's risk of an unplanned readmission within 30 days of being discharged (0 -100%). The score is based on dignosis, age, lab data, medications, orders, and past utilization.   Low:  0-14.9   Medium: 15-21.9   High: 22-29.9   Extreme: 30 and above         Discharge Instructions:  Allergies as of 12/31/2021   No Known Allergies      Medication List     TAKE these medications    acetaminophen 500 MG tablet Commonly known as: TYLENOL Take 500 mg by mouth every 6 (six) hours as needed for mild pain or fever.   Breo Ellipta 100-25 MCG/ACT Aepb Generic drug: fluticasone furoate-vilanterol Inhale 1 puff into the lungs daily.   furosemide 20 MG tablet Commonly known as: Lasix Hold due to low blood pressure. What changed:  how much to take how to take this when to take this additional instructions   Iron 325 (65 Fe) MG Tabs Take 325 mg by mouth every other day.   linezolid 600 MG tablet Commonly known as: ZYVOX Take 1 tablet (600 mg total) by mouth every 12 (twelve) hours for 7 days.   loperamide 2 MG capsule Commonly known as: IMODIUM Take 1 capsule (2 mg total) by mouth as needed for diarrhea or loose stools.   mometasone-formoterol 100-5 MCG/ACT Aero Commonly known as: DULERA Inhale 2 puffs into the lungs 2 (two) times daily.   multivitamin with minerals Tabs tablet Take 1 tablet by mouth daily.   pantoprazole 40 MG tablet Commonly known as: PROTONIX Take 40 mg by mouth every morning.   rivaroxaban 10 MG Tabs tablet Commonly known as: XARELTO Hold until  followup with urology. What changed:  how much to take how to take this when to take this additional instructions   rosuvastatin 10 MG tablet Commonly known as: CRESTOR Take 1 tablet (10 mg total) by mouth daily. What changed: when to take this   vitamin C 1000 MG tablet Take 1,000 mg by mouth daily.         Follow-up Information     [facility] Follow up in 1 week(s).   Specialty: General Practice Contact information: [address] [place] [address] [phone]                 No Known Allergies   The results of significant diagnostics from this hospitalization (including imaging, microbiology, ancillary and laboratory) are listed below for reference.   Consultations:   Procedures/Studies: IR NEPHROSTOMY PLACEMENT LEFT  Result Date: 12/27/2021 INDICATION: LEFT hydronephrosis despite ureteral stent. EXAM: ULTRASOUND AND FLUOROSCOPIC GUIDED PLACEMENT OF LEFT NEPHROSTOMY TUBE COMPARISON:  CT AP, 12/24/2021. MEDICATIONS: Ciprofloxacin 400 mg IV; The antibiotic was administered in an appropriate time  frame prior to skin puncture. ANESTHESIA/SEDATION: Moderate (conscious) sedation was employed during this procedure. A total of Versed 5.0 mg and Fentanyl 200 mcg was administered intravenously. Moderate Sedation Time: 46 minutes. The patient's level of consciousness and vital signs were monitored continuously by radiology nursing throughout the procedure under my direct supervision. CONTRAST:  10 mL Isovue 300 - administered into the renal collecting system FLUOROSCOPY TIME:  Fluoroscopic dose; 9.2 mGy COMPLICATIONS: None immediate. PROCEDURE: The procedure, risks, benefits, and alternatives were explained to the the patient and/or patient's representative, questions were encouraged and answered and informed consent was obtained. A timeout was performed prior to the initiation of the procedure. The operative site was prepped and  draped in the usual sterile fashion and a sterile drape was applied covering the operative field. A sterile gown and sterile gloves were used for the procedure. Local anesthesia was provided with 1% Lidocaine with epinephrine. Ultrasound was used to localize the LEFT kidney. Under direct ultrasound guidance, a 20 gauge needle was advanced into the renal collecting system. An ultrasound image documentation was performed. Access within the collecting system was confirmed with the efflux of urine followed by limited contrast injection. Under intermittent fluoroscopic guidance, an 0.018 wire was advanced into the collecting system and the tract was dilated with an Accustick stent. Next, over a short Amplatz wire, the track was further dilated ultimately allowing placement of a 10 Fr percutaneous nephrostomy catheter with end coiled and locked within the renal pelvis. Contrast was injected and several spot fluoroscopic images were obtained in various obliquities. The catheter was secured at the skin entrance site with an interrupted suture and a stat lock device and connected to a gravity bag. Dressings were applied. The patient tolerated procedure well without immediate postprocedural complication. FINDINGS: Ultrasound scanning demonstrates a moderate to severely dilated LEFT collecting system. Under a combination of ultrasound and fluoroscopic guidance, a posterior inferior calix was targeted allowing placement of a 10 Fr percutaneous nephrostomy catheter with end coiled and locked within the renal pelvis. Contrast injection confirmed appropriate positioning. IMPRESSION: Successful placement of a LEFT sided 10 Fr percutaneous nephrostomy drainage catheter, as above. PLAN: The patient is to return to Vascular Interventional Radiology for routine nephrostomy drainage catheter exchange in 2 months. Roanna Banning, MD [facility] Radiology Electronically Signed   By: Roanna Banning M.D.   On: 12/27/2021 14:28   US Venous Img Lower Bilateral (DVT)  Result Date: 12/25/2021 CLINICAL DATA:  Bilateral leg pain and swelling. History of DVT and patient has an IVC filter. EXAM: BILATERAL LOWER EXTREMITY VENOUS DOPPLER ULTRASOUND TECHNIQUE: Gray-scale sonography with graded compression, as well as color Doppler and duplex ultrasound were performed to evaluate the lower extremity deep venous systems from the level of the common femoral vein and including the common femoral, femoral, profunda femoral, popliteal and calf veins including the posterior tibial, peroneal and gastrocnemius veins when visible. The superficial great saphenous vein was also interrogated. Spectral Doppler was utilized to evaluate flow at rest and with distal augmentation maneuvers in the common femoral, femoral and popliteal veins. COMPARISON:  Lower extremity venous duplex 11/17/2021 FINDINGS: RIGHT LOWER EXTREMITY Common Femoral Vein: Again noted is incomplete compressibility of the right common femoral vein. Color Doppler flow within the right common femoral vein. Findings likely represent the sequelae of prior DVT. Saphenofemoral Junction: No evidence of thrombus. Normal color Doppler flow. Profunda Femoral Vein: Again noted is incomplete compressibility with color Doppler flow. Femoral Vein: Again noted is  incomplete compressibility in the right femoral vein with echogenic wall thickening. There is color Doppler flow in the right femoral vein. Findings are most compatible with sequela of prior DVT. Popliteal Vein: Again noted is partial compressibility in the popliteal vein. There is color Doppler flow. Calf Veins: Limited evaluation. Other Findings:  None. LEFT LOWER EXTREMITY Common Femoral Vein: Again noted is echogenic wall thickening and partial compressibility of the left common femoral vein compatible with changes from prior DVT. Color Doppler flow in the left common femoral vein. Saphenofemoral Junction:  No evidence of thrombus. Normal color Doppler flow. Profunda Femoral Vein: Again noted is partial compressibility with color Doppler flow. Femoral Vein: Again noted is incomplete compressibility of the left femoral vein with some color Doppler flow. Popliteal Vein: Incomplete compressibility with color Doppler flow. Calf Veins: Limited evaluation. Other Findings:  None. IMPRESSION: 1. Stable venous duplex of the bilateral lower extremities. Again noted is incomplete compressibility of the bilateral common femoral veins, profunda femoral veins, femoral veins and popliteal veins. Findings are most compatible with the sequelae of previous DVT. No evidence to suggest acute thrombosis. 2. Limited evaluation of the deep calf veins bilaterally. Electronically Signed   By: Richarda Overlie M.D.   On: 12/25/2021 10:41   CT ABDOMEN PELVIS W CONTRAST  Result Date: 12/24/2021 CLINICAL DATA:  Gross hematuria. Vaginal bleeding. History of kidney stones. EXAM: CT ABDOMEN AND PELVIS WITH CONTRAST TECHNIQUE: Multidetector CT imaging of the abdomen and pelvis was performed using the standard protocol following bolus administration of intravenous contrast. RADIATION DOSE REDUCTION: This exam was performed according to the departmental dose-optimization program which includes automated exposure control, adjustment of the mA and/or kV according to patient size and/or use of iterative reconstruction technique. CONTRAST:  OMNIPAQUE IOHEXOL 300 MG/ML  SOLN COMPARISON:  11/17/2021 FINDINGS: Lower chest: Patchy nodular infiltrates in the lung bases with peribronchial thickening and some mucous plugging. Changes may represent multifocal pneumonia. Hepatobiliary: No focal liver abnormality is seen. No gallstones, gallbladder wall thickening, or biliary dilatation. Pancreas: Unremarkable. No pancreatic ductal dilatation or surrounding inflammatory changes. Spleen: Normal in size without focal abnormality. Adrenals/Urinary Tract: No adrenal  gland nodules. Renal nephrograms are symmetrical and homogeneous. Bilateral parapelvic cysts. No follow-up is indicated for this typically benign finding. Left hydronephrosis with a left ureteral stent in place. Proximal pigtail is in the left renal pelvis. Distal pigtail is in the bladder. No stones are identified. Bladder wall is diffusely thickened, suggesting cystitis. No bladder stones. Stomach/Bowel: Stomach is within normal limits. Appendix appears normal. No evidence of bowel wall thickening, distention, or inflammatory changes. Vascular/Lymphatic: Aortic atherosclerosis. No enlarged abdominal or pelvic lymph nodes. Inferior vena caval filter is in place. The IVC below the filter is decompressed. Reproductive: Uterus and ovaries are not enlarged. Other: Scarring in the anterior abdominal wall, likely postoperative. No free air or free fluid in the abdomen. Musculoskeletal: Degenerative changes in the spine. Severe degenerative changes in the hips. No acute bony abnormalities. IMPRESSION: 1. Nodular infiltrates in the lung bases with peribronchial thickening and mucous plugging. Changes may indicate multifocal pneumonia. 2. Left renal hydronephrosis with left ureteral stent in place. No obstructing stones identified. 3. Diffuse bladder wall thickening suggesting cystitis. 4. Severe degenerative changes in the hips. Electronically Signed   By: Burman Nieves M.D.   On: 12/24/2021 19:19      Labs: BNP (last 3 results) Recent Labs    09/11/21 0222 09/16/21 0524 10/17/21 0606  BNP 473.3* 566.5* 927.6*   Basic  Metabolic Panel: Recent Labs  Lab 12/27/21 0357 12/28/21 0400 12/29/21 0436 12/30/21 0433 12/31/21 0418  NA 139 140 136 137 139  K 3.9 4.3 4.2 4.1 4.0  CL 111 111 107 110 109  CO2 25 25 25 24 26   GLUCOSE 112* 113* 104* 135* 117*  BUN 15 14 14 14 14   CREATININE 0.67 0.76 0.71 0.66 0.64  CALCIUM 8.5* 8.4* 8.6* 8.5* 8.6*  MG 2.0 2.1 2.0 1.9 1.9   Liver Function Tests: Recent  Labs  Lab 12/24/21 1232  AST 16  ALT 8  ALKPHOS 45  BILITOT 1.3*  PROT 7.3  ALBUMIN 3.3*   No results for input(s): "LIPASE", "AMYLASE" in the last 168 hours. No results for input(s): "AMMONIA" in the last 168 hours. CBC: Recent Labs  Lab 12/24/21 1232 12/25/21 0316 12/27/21 0357 12/28/21 0400 12/29/21 0436 12/30/21 0433 12/31/21 0418  WBC 3.8*   < > 4.7 6.0 3.8* 4.5 4.1  NEUTROABS 2.6  --   --   --   --   --   --   HGB 8.0*   < > 7.6* 8.2* 8.7* 8.1* 7.8*  HCT 27.7*   < > 24.9* 26.9* 29.2* 26.5* 26.3*  MCV 95.2   < > 90.2 90.9 93.0 91.4 91.3  PLT 249   < > 220 218 207 223 209   < > = values in this interval not displayed.   Cardiac Enzymes: No results for input(s): "CKTOTAL", "CKMB", "CKMBINDEX", "TROPONINI" in the last 168 hours. BNP: Invalid input(s): "POCBNP" CBG: No results for input(s): "GLUCAP" in the last 168 hours. D-Dimer No results for input(s): "DDIMER" in the last 72 hours. Hgb A1c No results for input(s): "HGBA1C" in the last 72 hours. Lipid Profile No results for input(s): "CHOL", "HDL", "LDLCALC", "TRIG", "CHOLHDL", "LDLDIRECT" in the last 72 hours. Thyroid function studies No results for input(s): "TSH", "T4TOTAL", "T3FREE", "THYROIDAB" in the last 72 hours.  Invalid input(s): "FREET3" Anemia work up No results for input(s): "VITAMINB12", "FOLATE", "FERRITIN", "TIBC", "IRON", "RETICCTPCT" in the last 72 hours. Urinalysis    Component Value Date/Time   COLORURINE RED (A) 12/24/2021 1753   APPEARANCEUR TURBID (A) 12/24/2021 1753   APPEARANCEUR Hazy (A) 12/14/2021 0854   LABSPEC 1.018 12/24/2021 1753   PHURINE  12/24/2021 1753    TEST NOT REPORTED DUE TO COLOR INTERFERENCE OF URINE PIGMENT   GLUCOSEU (A) 12/24/2021 1753    TEST NOT REPORTED DUE TO COLOR INTERFERENCE OF URINE PIGMENT   HGBUR (A) 12/24/2021 1753    TEST NOT REPORTED DUE TO COLOR INTERFERENCE OF URINE PIGMENT   BILIRUBINUR (A) 12/24/2021 1753    TEST NOT REPORTED DUE TO COLOR  INTERFERENCE OF URINE PIGMENT   BILIRUBINUR Negative 12/14/2021 0854   KETONESUR (A) 12/24/2021 1753    TEST NOT REPORTED DUE TO COLOR INTERFERENCE OF URINE PIGMENT   PROTEINUR (A) 12/24/2021 1753    TEST NOT REPORTED DUE TO COLOR INTERFERENCE OF URINE PIGMENT   NITRITE (A) 12/24/2021 1753    TEST NOT REPORTED DUE TO COLOR INTERFERENCE OF URINE PIGMENT   LEUKOCYTESUR (A) 12/24/2021 1753    TEST NOT REPORTED DUE TO COLOR INTERFERENCE OF URINE PIGMENT   Sepsis Labs Recent Labs  Lab 12/28/21 0400 12/29/21 0436 12/30/21 0433 12/31/21 0418  WBC 6.0 3.8* 4.5 4.1   Microbiology Recent Results (from the past 240 hour(s))  Urine Culture     Status: Abnormal   Collection Time: 12/24/21  5:53 PM   Specimen: Urine, Clean  Catch  Result Value Ref Range Status   Specimen Description   Final    URINE, CLEAN CATCH Performed at [facility], [address] [place], [place] [address]    Special Requests   Final    NONE Performed at [facility], [address] [place], [place] [address]    Culture 30,000 COLONIES/mL ENTEROCOCCUS FAECIUM (A)  Final   Report Status 12/27/2021 FINAL  Final   Organism ID, Bacteria ENTEROCOCCUS FAECIUM (A)  Final      Susceptibility   Enterococcus faecium - MIC*    AMPICILLIN >=32 RESISTANT Resistant     NITROFURANTOIN 128 RESISTANT Resistant     VANCOMYCIN <=0.5 SENSITIVE Sensitive     * 30,000 COLONIES/mL ENTEROCOCCUS FAECIUM  Urine Culture     Status: Abnormal   Collection Time: 12/27/21  2:00 PM   Specimen: Urine, Random  Result Value Ref Range Status   Specimen Description URINE, RANDOM  Final   Special Requests   Final    NEPHROSTOMY LEFT Performed at [facility], [address] [place], [place] [address]    Culture (A)  Final    >=100,000 COLONIES/mL ENTEROCOCCUS FAECIUM >=100,000 COLONIES/mL YEAST    Report Status 12/31/2021 FINAL  Final   Organism ID, Bacteria ENTEROCOCCUS FAECIUM (A)  Final      Susceptibility    Enterococcus faecium - MIC*    AMPICILLIN >=32 RESISTANT Resistant     NITROFURANTOIN 256 RESISTANT Resistant     VANCOMYCIN <=0.5 SENSITIVE Sensitive     * >=100,000 COLONIES/mL ENTEROCOCCUS FAECIUM     Total time spend on discharging this patient, including the last patient exam, discussing the hospital stay, instructions for ongoing care as it relates to all pertinent caregivers, as well as preparing the medical discharge records, prescriptions, and/or referrals as applicable, is 50 minutes.    Darlin Priestly, MD  [facility] 12/31/2021, 10:55 AM

## 2021-12-31 NOTE — Care Management Important Message (Signed)
Important Message  Patient Details  Name: Courtney Mack MRN: [id number] Date of Birth: Sep 04, 1955   Medicare Important Message Given:  Yes     Olegario Messier A Sanjuana Mruk 12/31/2021, 12:07 PM

## 2021-12-31 NOTE — TOC Transition Note (Signed)
Transition of Care [facility]) - CM/SW Discharge Note   Patient Details  Name: Courtney Mack MRN: [id number] Date of Birth: [birth year]  [facility] of Care [facility] CM/SW Contact:  Chapman Fitch, RN Phone Number: 12/31/2021, 11:45 AM   Clinical Narrative:      Patient to discharge home today [name] with [facility] notified of discharge  Per patient daughter will be transporting at discharge    Barriers to Discharge: Continued Medical Work up   Patient Goals and CMS Choice   CMS Medicare.gov Compare Post Acute Care list provided to:: Patient    Discharge Placement                       Discharge Plan and Services   Discharge Planning Services: CM Consult Post Acute Care Choice: Home Health                    HH Arranged: PT, OT, RN [name] Agency: [facility] Date [facility] Agency Contacted: 12/27/21   Representative spoke with at [facility] Agency: [name]  Social Determinants of Health (SDOH) Interventions     Readmission Risk Interventions    12/27/2021    2:26 PM 11/18/2021    9:04 AM 10/18/2021   11:23 AM  Readmission Risk Prevention Plan  Transportation Screening Complete Complete Complete  Medication Review Oceanographer) Complete Complete Complete  PCP or Specialist appointment within 3-5 days of discharge  Complete Complete  HRI or Home Care Consult Complete Complete Complete  SW Recovery Care/Counseling Consult Complete Complete Complete  Palliative Care Screening Complete Complete Complete  Skilled Nursing Facility Not Applicable Complete Complete

## 2022-01-01 ENCOUNTER — Telehealth: Payer: Self-pay | Admitting: Student

## 2022-01-02 ENCOUNTER — Other Ambulatory Visit: Payer: Self-pay | Admitting: Hospitalist

## 2022-01-02 MED ORDER — FLUCONAZOLE 200 MG PO TABS: 400.0000 mg | ORAL_TABLET | Freq: Every day | ORAL | 0 refills | Status: AC

## 2022-01-02 NOTE — Progress Notes (Addendum)
Urine cx obtained during nephrotomy tube placement on 12/27/21 returned pos for yeast after pt was discharged.  Due to multiple urological issues pt has and upcoming stent removal, decision was made to treat.  I sent a Rx for fluconazole 400 mg daily for 10 days to pt's pharm.  Tried to reach 2 daughters but no answer.  I was able to reach brother Delman Cheadle and let him know about the fluconazole Rx I have send to pt's pharm.

## 2022-01-03 ENCOUNTER — Ambulatory Visit
Admission: RE | Admit: 2022-01-03 | Discharge: 2022-01-03 | Disposition: A | Discharge status: Home / Self Care | Payer: Medicare (Managed Care) | Source: Ambulatory Visit | Attending: Urology | Admitting: Urology

## 2022-01-03 ENCOUNTER — Ambulatory Visit (INDEPENDENT_AMBULATORY_CARE_PROVIDER_SITE_OTHER): Payer: Medicare (Managed Care) | Admitting: Urology

## 2022-01-03 ENCOUNTER — Encounter: Payer: Self-pay | Admitting: Urology

## 2022-01-03 ENCOUNTER — Ambulatory Visit
Admission: RE | Admit: 2022-01-03 | Discharge: 2022-01-03 | Disposition: A | Discharge status: Home / Self Care | Payer: Medicare (Managed Care) | Attending: Urology | Admitting: Urology

## 2022-01-03 VITALS — BP 165/86 | HR 78 | Ht 59.0 in | Wt 180.0 lb

## 2022-01-03 DIAGNOSIS — N1330 Unspecified hydronephrosis: Secondary | ICD-10-CM | POA: Diagnosis present

## 2022-01-03 DIAGNOSIS — N390 Urinary tract infection, site not specified: Secondary | ICD-10-CM

## 2022-01-04 ENCOUNTER — Encounter: Payer: Self-pay | Admitting: Urology

## 2022-01-04 LAB — MICROSCOPIC EXAMINATION
RBC, Urine: >30 /hpf — AB (ref 0–2)
WBC, UA: >30 /hpf — AB (ref 0–5)

## 2022-01-04 LAB — URINALYSIS, COMPLETE
Bilirubin, UA: NEGATIVE
Glucose, UA: NEGATIVE
Ketones, UA: NEGATIVE
Nitrite, UA: NEGATIVE
Specific Gravity, UA: 1.025 (ref 1.005–1.030)
Urobilinogen, Ur: 0.2 mg/dL (ref 0.2–1.0)
pH, UA: 7 (ref 5.0–7.5)

## 2022-01-04 NOTE — Progress Notes (Signed)
01/03/2022 8:28 AM   Courtney Mack 27-Jul-1955 [id number]  Referring provider: [facility], [address] [place],  [place] [address]  Chief Complaint  Patient presents with   Cysto Stent Removal   Other    HPI: Recent hospitalization for gross hematuria on Xarelto and felt her indwelling stent was contributing to her hematuria.  She opted for percutaneous nephrostomy placement and was scheduled today for cystoscopy/stent removal.  She states earlier this afternoon she noted the stent protruding from the urethra and she removed.   PMH: Past Medical History:  Diagnosis Date   Acute bilateral deep vein thrombosis (DVT) of femoral veins ([place]) 01/02/2021   Acute massive pulmonary embolism ([place]) 08/25/2020   Last Assessment & Plan:  Formatting of this note might be different from the original. Post vena caval filter and on xarelto and O2   Anemia    ARF (acute respiratory failure) ([place])    Cancer ([place])    endometrial   Cellulitis of left lower leg 06/20/2021   CHF (congestive heart failure) ([place])    COVID-19    Diabetes mellitus without complication ([place])    DVT (deep venous thrombosis) ([place]) 09/06/2020   High cholesterol    Hx of blood clots    Hyperlipidemia    Hypertension    IDA (iron deficiency anemia) 09/21/2020   Obesity    Pressure injury of skin 06/21/2021   Pulmonary embolism ([place])    Sepsis ([place])    Thrombocytopenia ([place])    Urinary tract infection 09/13/2021    Surgical History: Past Surgical History:  Procedure Laterality Date   COLONOSCOPY N/A 06/21/2021   Procedure: COLONOSCOPY;  Surgeon: Toledo, Benay Pike, MD;  Location: [facility] ENDOSCOPY;  Service: Gastroenterology;  Laterality: N/A;   CYSTOSCOPY W/ URETERAL STENT PLACEMENT Left 09/11/2021   Procedure: CYSTOSCOPY WITH RETROGRADE PYELOGRAM/URETERAL STENT PLACEMENT;  Surgeon: Abbie Sons, MD;  Location: [facility] ORS;  Service: Urology;  Laterality: Left;    CYSTOSCOPY W/ URETERAL STENT PLACEMENT Left 11/17/2021   Procedure: CYSTOSCOPY WITH STENT REPLACEMENT;  Surgeon: Irine Seal, MD;  Location: [facility] ORS;  Service: Urology;  Laterality: Left;   CYSTOSCOPY WITH FULGERATION  11/17/2021   Procedure: CYSTOSCOPY WITH FULGERATION;  Surgeon: Irine Seal, MD;  Location: [facility];  Service: Urology;;   CYSTOSCOPY WITH STENT PLACEMENT Left 10/17/2021   Procedure: CYSTOSCOPY WITH STENT PLACEMENT;  Surgeon: Abbie Sons, MD;  Location: [facility];  Service: Urology;  Laterality: Left;   CYSTOSCOPY WITH URETEROSCOPY, STONE BASKETRY AND STENT PLACEMENT Left 10/16/2021   Procedure: CYSTOSCOPY WITH URETEROSCOPY  AND STENT REMOVAL;  Surgeon: Abbie Sons, MD;  Location: [facility];  Service: Urology;  Laterality: Left;   ENTEROSCOPY N/A 08/17/2021   Procedure: ENTEROSCOPY;  Surgeon: Lin Landsman, MD;  [facility]COPY;  Service: Gastroenterology;  Laterality: N/A;   ESOPHAGOGASTRODUODENOSCOPY N/A 06/21/2021   Procedure: ESOPHAGOGASTRODUODENOSCOPY (EGD);  Surgeon: Toledo, Benay Pike, MD;  Location: [facility];  Service: Gastroenterology;  Laterality: N/A;   ESOPHAGOGASTRODUODENOSCOPY (EGD) WITH PROPOFOL N/A 08/17/2021   Procedure: ESOPHAGOGASTRODUODENOSCOPY (EGD) WITH PROPOFOL;  Surgeon: Lin Landsman, MD;  Location: [facility] ENDOSCOPY;  Service: Gastroenterology;  Laterality: N/A;   ESOPHAGOGASTRODUODENOSCOPY (EGD) WITH PROPOFOL N/A 10/14/2021   Procedure: ESOPHAGOGASTRODUODENOSCOPY (EGD) WITH PROPOFOL;  Surgeon: [name], MD;  Location: [facility] ENDOSCOPY;  Service: Endoscopy;  Laterality: N/A;   GIVENS CAPSULE STUDY N/A 06/22/2021   Procedure: GIVENS CAPSULE STUDY;  Surgeon: Toledo, Benay Pike, MD;  Location: [facility] ENDOSCOPY;  Service: Gastroenterology;  Laterality: N/A;   IR NEPHROSTOMY PLACEMENT LEFT  12/27/2021   IVC FILTER INSERTION N/A 08/18/2020   Procedure: IVC FILTER INSERTION;  Surgeon: Katha Cabal, MD;  Location: [facility];   Service: Cardiovascular;  Laterality: N/A;   PERIPHERAL VASCULAR THROMBECTOMY Bilateral 01/03/2021   Procedure: PERIPHERAL VASCULAR THROMBECTOMY;  Surgeon: Algernon Huxley, MD;  Location: [facility] LAB;  Service: Cardiovascular;  Laterality: Bilateral;   PULMONARY THROMBECTOMY N/A 08/18/2020   Procedure: PULMONARY THROMBECTOMY;  Surgeon: Katha Cabal, MD;  Location: [place];  Service: Cardiovascular;  Laterality: N/A;   TUBAL LIGATION     VISCERAL ANGIOGRAPHY N/A 07/18/2021   Procedure: VISCERAL ANGIOGRAPHY;  Surgeon: Algernon Huxley, MD;  Location: [facility];  Service: Cardiovascular;  Laterality: N/A;    Home Medications:  Allergies as of 01/03/2022   No Known Allergies      Medication List        Accurate as of January 03, 2022 11:59 PM. If you have any questions, ask your nurse or doctor.          acetaminophen 500 MG tablet Commonly known as: TYLENOL Take 500 mg by mouth every 6 (six) hours as needed for mild pain or fever.   Breo Ellipta 100-25 MCG/ACT Aepb Generic drug: fluticasone furoate-vilanterol Inhale 1 puff into the lungs daily.   fluconazole 200 MG tablet Commonly known as: DIFLUCAN Take 2 tablets (400 mg total) by mouth daily for 10 days.   furosemide 20 MG tablet Commonly known as: Lasix Hold due to low blood pressure.   Iron 325 (65 Fe) MG Tabs Take 325 mg by mouth every other day.   linezolid 600 MG tablet Commonly known as: ZYVOX Take 1 tablet (600 mg total) by mouth every 12 (twelve) hours for 7 days.   loperamide 2 MG capsule Commonly known as: IMODIUM Take 1 capsule (2 mg total) by mouth as needed for diarrhea or loose stools.   mometasone-formoterol 100-5 MCG/ACT Aero Commonly known as: DULERA Inhale 2 puffs into the lungs 2 (two) times daily.   multivitamin with minerals Tabs tablet Take 1 tablet by mouth daily.   pantoprazole 40 MG tablet Commonly known as: PROTONIX Take 40 mg by mouth every morning.    rivaroxaban 10 MG Tabs tablet Commonly known as: XARELTO Hold until followup with urology.   rosuvastatin 10 MG tablet Commonly known as: CRESTOR Take 1 tablet (10 mg total) by mouth daily. What changed: when to take this   vitamin C 1000 MG tablet Take 1,000 mg by mouth daily.        Allergies: No Known Allergies  Family History: Family History  Problem Relation Age of Onset   Hypertension Mother    Diabetes Mother    Diabetes Father    Hypertension Father    High Cholesterol Father    Congestive Heart Failure Father    Breast cancer Cousin     Social History:  reports that she has never smoked. She has never used smokeless tobacco. She reports that she does not currently use alcohol. She reports that she does not currently use drugs.   Physical Exam: BP (!) 165/86   Pulse 78   Ht '4\' 11"'$  (1.499 m)   Wt 180 lb (81.6 kg)   BMI 36.36 kg/m   Constitutional:  Alert and oriented, No acute distress. OF:[id number] tube draining clear urine.  Dressing intact Psychiatric: Normal mood and affect.  Pertinent Imaging:  KUB was obtained and the images were personally reviewed and interpreted.  Nephrostomy tube is in position and ureteral stent no longer present   Assessment & Plan:    1.  Gross hematuria Resolved  2.  Left hydronephrosis Managed with indwelling nephrostomy   Abbie Sons, MD  [facility] [address], [place], [place] [address] [phone]

## 2022-01-09 ENCOUNTER — Telehealth: Payer: Self-pay | Admitting: *Deleted

## 2022-01-09 ENCOUNTER — Telehealth: Payer: Self-pay | Admitting: Student

## 2022-01-09 NOTE — Telephone Encounter (Signed)
-----   Message from Abbie Sons, MD sent at 01/07/2022  9:11 PM EDT ----- KUB reviewed-stent no longer present

## 2022-01-09 NOTE — Telephone Encounter (Signed)
Notified patient as instructed, patient pleased °

## 2022-01-09 NOTE — Telephone Encounter (Signed)
Spoke with patient and discussed the Palliative referral/services with her and all questions were answered and I rec'd a verbal consent from patient to schedule Consult.  I have scheduled a [place] for 01/15/22 @ 10:30 AM.

## 2022-01-10 ENCOUNTER — Encounter: Payer: Self-pay | Admitting: Family

## 2022-01-10 ENCOUNTER — Ambulatory Visit: Payer: Medicare (Managed Care) | Attending: Family | Admitting: Family

## 2022-01-10 VITALS — BP 111/71 | HR 82 | Resp 16 | Ht 59.0 in

## 2022-01-10 DIAGNOSIS — Z86718 Personal history of other venous thrombosis and embolism: Secondary | ICD-10-CM | POA: Insufficient documentation

## 2022-01-10 DIAGNOSIS — I5032 Chronic diastolic (congestive) heart failure: Secondary | ICD-10-CM | POA: Insufficient documentation

## 2022-01-10 DIAGNOSIS — I890 Lymphedema, not elsewhere classified: Secondary | ICD-10-CM | POA: Insufficient documentation

## 2022-01-10 DIAGNOSIS — Z86711 Personal history of pulmonary embolism: Secondary | ICD-10-CM | POA: Diagnosis not present

## 2022-01-10 DIAGNOSIS — F17210 Nicotine dependence, cigarettes, uncomplicated: Secondary | ICD-10-CM | POA: Diagnosis not present

## 2022-01-10 DIAGNOSIS — Z8616 Personal history of COVID-19: Secondary | ICD-10-CM | POA: Diagnosis not present

## 2022-01-10 DIAGNOSIS — R5383 Other fatigue: Secondary | ICD-10-CM | POA: Insufficient documentation

## 2022-01-10 DIAGNOSIS — Z87440 Personal history of urinary (tract) infections: Secondary | ICD-10-CM | POA: Insufficient documentation

## 2022-01-10 DIAGNOSIS — I10 Essential (primary) hypertension: Secondary | ICD-10-CM | POA: Diagnosis not present

## 2022-01-10 DIAGNOSIS — D649 Anemia, unspecified: Secondary | ICD-10-CM | POA: Diagnosis not present

## 2022-01-10 DIAGNOSIS — E119 Type 2 diabetes mellitus without complications: Secondary | ICD-10-CM | POA: Diagnosis not present

## 2022-01-10 DIAGNOSIS — I110 Hypertensive heart disease with heart failure: Secondary | ICD-10-CM | POA: Insufficient documentation

## 2022-01-10 DIAGNOSIS — N390 Urinary tract infection, site not specified: Secondary | ICD-10-CM

## 2022-01-10 NOTE — Progress Notes (Signed)
Patient ID: Courtney Mack, female    DOB: 1956/07/05, 66 y.o.   MRN: [id number]  HPI  Courtney Mack is a 66 y/o female with a history of DM, hyperlipidemia, HTN, DVT, PE, anemia, thrombocytopenia, tobacco use and chronic heart failure.    Echo report from 09/12/21 reviewed and showed an EF of 55-60% along with mild LVH/ LAE & mild MR.    Admitted 12/24/21 due to hematuria. Urology and palliative care consults obtained. CT showed left hydronephrosis with left ureteral stent in place.  No obstructing stones identified. Urology consulted and recommended left nephrostomy tube which was placed on 6/22. Given antibiotics for UTI. Discharged after 7 days. Admitted 11/16/21 due to recurrent gross hematuria and anemia. Urology and medical oncology consults obtained. Chest CT showed small right PE. Heparin drip started.  S/p cystoscopy with removal and replacement of left double-J stent. Discharged after 3 days. Admitted 10/16/21 due to c/o abdominal pain, generalized weakness, fevers and chills.Patient underwent left ureteroscopy on 4/12 which was negative for obstructive stone. Needed vasopressin support due to hypotension. Urology and palliative care consults obtained. Given PRBC's due to anemia. Antibiotics given due to sepsis. Initially needed oxygen but able to be weaned to room air. Xarelto held. Discharged after 7 days. Admitted 10/12/21 due to hemoglobin of 6.4. Two units of PRBC's given along with IV protonix. GI consult obtained. EGD completed. Discharged after 2 days.    She presents today for a follow-up visit with a chief complaint of minimal fatigue upon moderate exertion. Describes this as chronic in nature. She has associated cough, shortness of breath, pedal edema (lymphedema) and easy bruising along with this. She denies any difficulty sleeping, dizziness, abdominal distention, palpitations or chest pain.   She is unsure of weight as she weighs only ~ every other day. She declined getting weighed  in the clinic today because she felt too unsteady.   Says that the swelling in her legs "isn't too bad". Sleeps in recliner with legs elevated. Daughter says that she was having some weeping of her skin which then blistered but they are now mostly healed up although she has quite a bit of dry skin over that area that had been weeping.   Past Medical History:  Diagnosis Date   Acute bilateral deep vein thrombosis (DVT) of femoral veins ([place]) 01/02/2021   Acute massive pulmonary embolism ([place]) 08/25/2020   Last Assessment & Plan:  Formatting of this note might be different from the original. Post vena caval filter and on xarelto and O2   Anemia    ARF (acute respiratory failure) ([place])    Cancer ([place])    endometrial   Cellulitis of left lower leg 06/20/2021   CHF (congestive heart failure) ([place])    COVID-19    Diabetes mellitus without complication ([place])    DVT (deep venous thrombosis) ([place]) 09/06/2020   High cholesterol    Hx of blood clots    Hyperlipidemia    Hypertension    IDA (iron deficiency anemia) 09/21/2020   Obesity    Pressure injury of skin 06/21/2021   Pulmonary embolism ([place])    Sepsis ([place])    Thrombocytopenia ([place])    Urinary tract infection 09/13/2021   Past Surgical History:  Procedure Laterality Date   COLONOSCOPY N/A 06/21/2021   Procedure: COLONOSCOPY;  Surgeon: Toledo, Benay Pike, MD;  Location: [facility] ENDOSCOPY;  Service: Gastroenterology;  Laterality: N/A;   CYSTOSCOPY W/ URETERAL STENT PLACEMENT Left 09/11/2021   Procedure: CYSTOSCOPY WITH RETROGRADE  PYELOGRAM/URETERAL STENT PLACEMENT;  Surgeon: Abbie Sons, MD;  Location: [facility];  Service: Urology;  Laterality: Left;   CYSTOSCOPY W/ URETERAL STENT PLACEMENT Left 11/17/2021   Procedure: CYSTOSCOPY WITH STENT REPLACEMENT;  Surgeon: Irine Seal, MD;  Location: [facility] ORS;  Service: Urology;  Laterality: Left;   CYSTOSCOPY WITH FULGERATION  11/17/2021   Procedure: CYSTOSCOPY WITH FULGERATION;  Surgeon: Irine Seal, MD;  Location: [facility];  Service: Urology;;   CYSTOSCOPY WITH STENT PLACEMENT Left 10/17/2021   Procedure: CYSTOSCOPY WITH STENT PLACEMENT;  Surgeon: Abbie Sons, MD;  Location: [facility];  Service: Urology;  Laterality: Left;   CYSTOSCOPY WITH URETEROSCOPY, STONE BASKETRY AND STENT PLACEMENT Left 10/16/2021   Procedure: CYSTOSCOPY WITH URETEROSCOPY  AND STENT REMOVAL;  Surgeon: Abbie Sons, MD;  Location: [facility] ORS;  Service: Urology;  Laterality: Left;   ENTEROSCOPY N/A 08/17/2021   Procedure: ENTEROSCOPY;  Surgeon: Lin Landsman, MD;  Location: [facility] ENDOSCOPY;  Service: Gastroenterology;  Laterality: N/A;   ESOPHAGOGASTRODUODENOSCOPY N/A 06/21/2021   Procedure: ESOPHAGOGASTRODUODENOSCOPY (EGD);  Surgeon: Toledo, Benay Pike, MD;  Location: [facility] ENDOSCOPY;  Service: Gastroenterology;  Laterality: N/A;   ESOPHAGOGASTRODUODENOSCOPY (EGD) WITH PROPOFOL N/A 08/17/2021   Procedure: ESOPHAGOGASTRODUODENOSCOPY (EGD) WITH PROPOFOL;  Surgeon: Lin Landsman, MD;  Location: [facility] ENDOSCOPY;  Service: Gastroenterology;  Laterality: N/A;   ESOPHAGOGASTRODUODENOSCOPY (EGD) WITH PROPOFOL N/A 10/14/2021   Procedure: ESOPHAGOGASTRODUODENOSCOPY (EGD) WITH PROPOFOL;  Surgeon: [name], MD;  Location: [facility] ENDOSCOPY;  Service: Endoscopy;  Laterality: N/A;   GIVENS CAPSULE STUDY N/A 06/22/2021   Procedure: GIVENS CAPSULE STUDY;  Surgeon: Toledo, Benay Pike, MD;  Location: [facility] ENDOSCOPY;  Service: Gastroenterology;  Laterality: N/A;   IR NEPHROSTOMY PLACEMENT LEFT  12/27/2021   IVC FILTER INSERTION N/A 08/18/2020   Procedure: IVC FILTER INSERTION;  Surgeon: Katha Cabal, MD;  Location: [facility] CV LAB;  Service: Cardiovascular;  Laterality: N/A;   PERIPHERAL VASCULAR THROMBECTOMY Bilateral 01/03/2021   Procedure: PERIPHERAL VASCULAR THROMBECTOMY;  Surgeon: Algernon Huxley, MD;  Location: [facility] LAB;  Service: Cardiovascular;  Laterality: Bilateral;   PULMONARY THROMBECTOMY N/A  08/18/2020   Procedure: PULMONARY THROMBECTOMY;  Surgeon: Katha Cabal, MD;  Location: [facility] LAB;  Service: Cardiovascular;  Laterality: N/A;   TUBAL LIGATION     VISCERAL ANGIOGRAPHY N/A 07/18/2021   Procedure: VISCERAL ANGIOGRAPHY;  Surgeon: Algernon Huxley, MD;  Location: [facility] CV LAB;  Service: Cardiovascular;  Laterality: N/A;   Family History  Problem Relation Age of Onset   Hypertension Mother    Diabetes Mother    Diabetes Father    Hypertension Father    High Cholesterol Father    Congestive Heart Failure Father    Breast cancer Cousin    Social History   Tobacco Use   Smoking status: Never   Smokeless tobacco: Never   Tobacco comments:    smoked 2-4 cigarettes for about 2-3 weeks   Substance Use Topics   Alcohol use: Not Currently   No Known Allergies  Prior to Admission medications   Medication Sig Start Date End Date Taking? Authorizing Provider  acetaminophen (TYLENOL) 500 MG tablet Take 500 mg by mouth every 6 (six) hours as needed for mild pain or fever.   Yes [provider]  Ascorbic Acid (VITAMIN C) 1000 MG tablet Take 1,000 mg by mouth daily.   Yes [provider]  BREO ELLIPTA 100-25 MCG/ACT AEPB Inhale 1 puff into the lungs daily. 11/15/21  Yes [provider]  Ferrous Sulfate (IRON) 325 (65 Fe) MG TABS Take 325 mg by mouth every other day. 07/05/21  Yes [provider]  fluconazole (DIFLUCAN) 200 MG tablet Take 2 tablets (400 mg total) by mouth daily for 10 days. 01/02/22 01/12/22 Yes Enzo Bi, MD  furosemide (LASIX) 20 MG tablet Hold due to low blood pressure. 12/31/21  Yes Enzo Bi, MD  loperamide (IMODIUM) 2 MG capsule Take 1 capsule (2 mg total) by mouth as needed for diarrhea or loose stools. 06/26/21  Yes Sreenath, Sudheer B, MD  mometasone-formoterol (DULERA) 100-5 MCG/ACT AERO Inhale 2 puffs into the lungs 2 (two) times daily.   Yes [provider]  Multiple Vitamin (MULTIVITAMIN WITH  MINERALS) TABS tablet Take 1 tablet by mouth daily. 09/12/20  Yes [name], MD  pantoprazole (PROTONIX) 40 MG tablet Take 40 mg by mouth every morning.   Yes [provider]  rivaroxaban (XARELTO) 10 MG TABS tablet Hold until followup with urology. 12/31/21  Yes Enzo Bi, MD  rosuvastatin (CRESTOR) 10 MG tablet Take 1 tablet (10 mg total) by mouth daily. Patient taking differently: Take 10 mg by mouth every morning. 10/03/20  Yes Earlie Server, MD    Review of Systems  Constitutional:  Positive for fatigue. Negative for appetite change.  HENT:  Negative for congestion, postnasal drip and sore throat.   Eyes:  Positive for visual disturbance (due to cataracts).  Respiratory:  Positive for cough ("every once in awhile") and shortness of breath (minimal).   Cardiovascular:  Positive for leg swelling (+ lymphedema). Negative for chest pain and palpitations.  Gastrointestinal:  Negative for abdominal distention and abdominal pain.  Endocrine: Negative.   Genitourinary: Negative.   Musculoskeletal:  Positive for back pain (at times). Negative for arthralgias.  Skin: Negative.   Allergic/Immunologic: Negative.   Neurological:  Negative for dizziness and light-headedness.  Hematological:  Negative for adenopathy. Bruises/bleeds easily.  Psychiatric/Behavioral:  Negative for dysphoric mood and sleep disturbance (sleeps in reclincer due to comfort and SOB). The patient is not nervous/anxious.    Vitals:   01/10/22 0920  BP: 111/71  Pulse: 82  Resp: 16  SpO2: 97%  Height: '4\' 11"'$  (1.499 m)   Wt Readings from Last 3 Encounters:  01/03/22 180 lb (81.6 kg)  12/24/21 180 lb (81.6 kg)  12/14/21 186 lb (84.4 kg)   Lab Results  Component Value Date   CREATININE 0.64 12/31/2021   CREATININE 0.66 12/30/2021   CREATININE 0.71 12/29/2021   Physical Exam Vitals and nursing note reviewed. Exam conducted with a chaperone present (daughter).  Constitutional:      Appearance: Normal  appearance.  HENT:     Head: Normocephalic and atraumatic.  Cardiovascular:     Rate and Rhythm: Normal rate and regular rhythm.  Pulmonary:     Effort: Pulmonary effort is normal. No respiratory distress.     Breath sounds: No wheezing or rales.  Abdominal:     General: There is no distension.     Palpations: Abdomen is soft.  Musculoskeletal:        General: No tenderness.     Cervical back: Normal range of motion and neck supple.     Right lower leg: Edema (+ lymphedema) present.     Left lower leg: Edema (+ lymphedema) present.  Skin:    General: Skin is warm and dry.  Neurological:     General: No focal deficit present.     Mental Status: She is alert and  oriented to person, place, and time.  Psychiatric:        Behavior: Behavior normal.        Thought Content: Thought content normal.    Assessment & Plan:   1: Chronic heart failure with preserved ejection fraction with structural changes (LVH/LAE)- - NYHA class II - euvolemic today - weighing every few days at home; reminded to call for an overnight weight gain of > 2 pounds or a weekly weight gain of > 5 pounds - patient says that she doesn't feel steady enough to weigh today - not adding salt except on rare occasions; daughter is looking at food labels and discussed keeping daily sodium intake to '2000mg'$  - saw cardiology Rockey Situ) 10/04/21 - recurrent UTI's so not a candidate for SGLT2 - consider entresto if BP will allow - BNP 10/17/21 was 927.6   2: HTN- - BP looks good (111/71) - follows with PCP at [facility] 12/31/21 reviewed and showed sodium 139, potassium 4.0, creatinine 0.64 and GFR >60   3: DM- - A1c 11/16/21 was 5.7%   4: Anemia- - hemoglobin 12/31/21 was 7.8 after 1 unit of PRBC's - saw hematology Tasia Catchings) 11/26/21  5: Lymphedema- - stage 2 - limited in her ability to exercise due to leg swelling - unable to wear compression socks - does elevate her legs in a recliner but  swelling never completely resolves - discussed referral to vein/vascular clinic to assist with lymphedema and they were agreeable; referral placed today  6: Recurrent UTI- - saw urology Bernardo Heater) 12/14/21 - reports having urology stent removed and tube inserted    Patient did not bring her medications nor a list. Each medication was verbally reviewed with the patient and she was encouraged to bring the bottles to every visit to confirm accuracy of list.   Return in 3 months, sooner if needed.

## 2022-01-10 NOTE — Patient Instructions (Addendum)
Continue weighing daily and call for an overnight weight gain of 3 pounds or more or a weekly weight gain of more than 5 pounds.  If you have voicemail, please make sure your mailbox is cleaned out so that we may leave a message and please make sure to listen to any voicemails.    Referral has been made to vascular for possible wrapping of your legs. Let me know if you don't get a phone call from them

## 2022-01-15 ENCOUNTER — Other Ambulatory Visit: Payer: Medicare (Managed Care) | Admitting: Student

## 2022-01-15 DIAGNOSIS — I5032 Chronic diastolic (congestive) heart failure: Secondary | ICD-10-CM

## 2022-01-15 DIAGNOSIS — R0602 Shortness of breath: Secondary | ICD-10-CM

## 2022-01-15 DIAGNOSIS — Z515 Encounter for palliative care: Secondary | ICD-10-CM

## 2022-01-15 DIAGNOSIS — N1330 Unspecified hydronephrosis: Secondary | ICD-10-CM

## 2022-01-15 DIAGNOSIS — R600 Localized edema: Secondary | ICD-10-CM

## 2022-01-15 NOTE — Progress Notes (Signed)
[facility] Palliative Care Consult Note Telephone: [phone]  Fax: [phone]   Date of encounter: 01/15/22 10:34 AM PATIENT NAME: Courtney Mack [id number]   [phone] (home)  DOB: 11-May-1956 MRN: [id number] PRIMARY CARE PROVIDER:    [facility], [facility],  [address] [place] [address] [phone]  REFERRING PROVIDER:   Center, [address] [place],  [place] [address] [phone]  RESPONSIBLE PARTY:    Contact Information     Name Relation Home Work Mobile   Courtney Mack,Courtney Mack Daughter   [phone]   Courtney Mack Daughter   [phone]   Courtney Mack   [phone]        Due to the COVID-19 crisis, this visit was done via telemedicine from my office and it was initiated and consent by this patient and or family.  I connected with  Courtney Mack OR PROXY on 01/15/22 by a video enabled telemedicine application and verified that I am speaking with the correct person using two identifiers.   I discussed the limitations of evaluation and management by telemedicine. The patient expressed understanding and agreed to proceed.                                    ASSESSMENT AND PLAN / RECOMMENDATIONS:   Advance Care Planning/Goals of Care: Goals include to maximize quality of life and symptom management. Patient/health care surrogate gave his/her permission to discuss.Our advance care planning conversation included a discussion about:    The value and importance of advance care planning  Experiences with loved ones who have been seriously ill or have died  Exploration of personal, cultural or spiritual beliefs that might influence medical decisions  Exploration of goals of care in the event of a sudden injury or illness  CODE STATUS: FULL Code  Education provided on palliative medicine.  Reviewed goals of care;  patient expresses CPR, would like to limit hospitalizations she is open to IV antibiotics and IV fluids as needed.  She is unsure about feeding tube.  Patient would like to review MOST form with family will send copy and review and complete upon next visit.  History of endometrial cancer; status post radiation.  She states she is unsure if she would receive any further treatments.  Palliative medicine will continue to provide ongoing support, symptom management as needed.  Symptom Management/Plan:  Chronic diastolic HF- patient with lower extremity edema, shortness of breath currently managed; denies shortness of breath or fatigue presently. Continue to weigh every other day, elevate legs. Continue furosemide as directed, hold for low BP. Patient to follow up with cardiology as scheduled. She is to also see vascular for LE edema.   Shortness of breath-patient with HF and hx of COPD. Continue Breo-Ellipta as directed. Monitor for worsening symptoms.   Chronic hydronephrosis- currently has nephrostomy tube in place.  Follow up with urology as scheduled.   Follow up Palliative Care Visit: Palliative care will continue to follow for complex medical decision making, advance care planning, and clarification of goals. Return in 12 weeks or prn.  I spent 18 minutes providing this consultation. More than 50% of the time in this consultation was spent in counseling and care coordination.   HOSPICE ELIGIBILITY/DIAGNOSIS: TBD  Chief Complaint: Palliative medicine initial consult.  HISTORY OF PRESENT ILLNESS:  Courtney Mack  is a 66 y.o. year old female  with chronic diastolic heart failure, PE, DVT, hypertension IDA, chronic hydronephrosis; history of endometrial cancer, gastric vascular ectasia.  Hospitalized 619 through 626 due to chronic hydronephrosis, hematuria.  Patient resides at home.  She reports reports doing well was seen by PCP yesterday.  Patient states home health has not started yet.  She  does report use walker for ambulation; denies any falls.  She denies having any pain, or shortness of breath.  She does endorse edema to bilateral lower extremities she has upcoming referral with vascular.  She endorses good appetite.  She weighs every other day; most recent weight 174 pounds.   History obtained from review of EMR, discussion with primary team, and interview with family, facility staff/caregiver and/or Courtney Mack.  I reviewed available labs, medications, imaging, studies and related documents from the EMR.  Records reviewed and summarized above.   ROS  10-Point ROS is negative, except for the pertinent positives and negatives detailed in HPI.  Physical Exam: Deferred d/t this being a telemedicine visit.   CURRENT PROBLEM LIST:  Patient Active Problem List   Diagnosis Date Noted   Hematuria 12/24/2021   History of pulmonary embolus (PE) 12/24/2021   Chronic radiation cystitis 11/17/2021   CHF (congestive heart failure) ([place]) 11/16/2021   Gross hematuria 11/16/2021   Abdominal pain 11/16/2021   Hydronephrosis, left    Sepsis with hypotension ([place]) 10/17/2021   Gastric antral vascular ectasia    Anemia due to GI blood loss, symptomatic with fatigue/lightheadedness and shortness of breath 10/12/2021   UTI (urinary tract infection) 09/13/2021   Anemia 09/13/2021   Acute on chronic blood loss anemia 07/18/2021   Sepsis ([place]) 06/20/2021   HLD (hyperlipidemia) 06/20/2021   Chronic diastolic CHF (congestive heart failure) ([place]) 06/20/2021   Risk factors for obstructive sleep apnea 11/10/2020   Uterine cancer ([place]) 11/08/2020   IDA (iron deficiency anemia) 09/21/2020   Endometrial cancer ([place]) 09/13/2020   Acute blood loss anemia 09/06/2020   Vaginal bleeding 09/06/2020   Hx of blood clots 09/06/2020   Anemia, unspecified 08/24/2020   Morbidly obese ([place]) 08/23/2020   Diabetes mellitus without complication ([place]) 80/99/8338   Dyslipidemia 08/23/2020   Vaginal  bleeding, abnormal 08/18/2020   Morbid obesity ([place]) 08/18/2020   PAST MEDICAL HISTORY:  Active Ambulatory Problems    Diagnosis Date Noted   Vaginal bleeding, abnormal 08/18/2020   Morbid obesity ([place]) 08/18/2020   Morbidly obese ([place]) 08/23/2020   Diabetes mellitus without complication ([place]) 25/11/3974   Dyslipidemia 08/23/2020   Anemia, unspecified 08/24/2020   Acute blood loss anemia 09/06/2020   Vaginal bleeding 09/06/2020   Endometrial cancer ([place]) 09/13/2020   IDA (iron deficiency anemia) 09/21/2020   Risk factors for obstructive sleep apnea 11/10/2020   Uterine cancer ([place]) 11/08/2020   Sepsis ([place]) 06/20/2021   HLD (hyperlipidemia) 06/20/2021   Chronic diastolic CHF (congestive heart failure) ([place]) 06/20/2021   Acute on chronic blood loss anemia 07/18/2021   UTI (urinary tract infection) 09/13/2021   Anemia 09/13/2021   Anemia due to GI blood loss, symptomatic with fatigue/lightheadedness and shortness of breath 10/12/2021   Gastric antral vascular ectasia    Sepsis with hypotension ([place]) 10/17/2021   Hydronephrosis, left    CHF (congestive heart failure) ([place]) 11/16/2021   Hx of blood clots 09/06/2020   Gross hematuria 11/16/2021   Abdominal pain 11/16/2021   Chronic radiation cystitis 11/17/2021   Hematuria 12/24/2021   History of pulmonary embolus (PE) 12/24/2021  Resolved Ambulatory Problems    Diagnosis Date Noted   Pulmonary embolism ([place]) 08/18/2020   Acute respiratory failure with hypoxia (HCC)    DVT (deep venous thrombosis) ([place]) 09/06/2020   Acute bilateral deep vein thrombosis (DVT) of femoral veins ([place]) 01/02/2021   Acute massive pulmonary embolism ([place]) 08/25/2020   DOE (dyspnea on exertion) 11/10/2020   Symptomatic anemia 06/20/2021   Fall at home, initial encounter 06/20/2021   Cough 06/20/2021   GI bleeding 06/20/2021   Cellulitis of left lower leg 06/20/2021   Pressure injury of skin 06/21/2021   Melena 07/18/2021   COVID-19    Septic  shock ([place]) 09/11/2021   AKI (acute kidney injury) ([place]) 09/13/2021   Thrombocytopenia ([place]) 09/13/2021   Acute on chronic diastolic CHF (congestive heart failure) ([place]) 09/16/2021   Past Medical History:  Diagnosis Date   ARF (acute respiratory failure) (HCC)    Cancer (HCC)    High cholesterol    Hyperlipidemia    Hypertension    Obesity    Urinary tract infection 09/13/2021   SOCIAL HX:  Social History   Tobacco Use   Smoking status: Never   Smokeless tobacco: Never   Tobacco comments:    smoked 2-4 cigarettes for about 2-3 weeks   Substance Use Topics   Alcohol use: Not Currently   FAMILY HX:  Family History  Problem Relation Age of Onset   Hypertension Mother    Diabetes Mother    Diabetes Father    Hypertension Father    High Cholesterol Father    Congestive Heart Failure Father    Breast cancer Cousin       ALLERGIES: No Known Allergies   PERTINENT MEDICATIONS:  Outpatient Encounter Medications as of 01/15/2022  Medication Sig   acetaminophen (TYLENOL) 500 MG tablet Take 500 mg by mouth every 6 (six) hours as needed for mild pain or fever.   Ascorbic Acid (VITAMIN C) 1000 MG tablet Take 1,000 mg by mouth daily.   BREO ELLIPTA 100-25 MCG/ACT AEPB Inhale 1 puff into the lungs daily.   Ferrous Sulfate (IRON) 325 (65 Fe) MG TABS Take 325 mg by mouth every other day.   furosemide (LASIX) 20 MG tablet Hold due to low blood pressure.   loperamide (IMODIUM) 2 MG capsule Take 1 capsule (2 mg total) by mouth as needed for diarrhea or loose stools.   mometasone-formoterol (DULERA) 100-5 MCG/ACT AERO Inhale 2 puffs into the lungs 2 (two) times daily.   Multiple Vitamin (MULTIVITAMIN WITH MINERALS) TABS tablet Take 1 tablet by mouth daily.   pantoprazole (PROTONIX) 40 MG tablet Take 40 mg by mouth every morning.   rivaroxaban (XARELTO) 10 MG TABS tablet Hold until followup with urology.   rosuvastatin (CRESTOR) 10 MG tablet Take 1 tablet (10 mg total) by mouth daily.  (Patient taking differently: Take 10 mg by mouth every morning.)   No facility-administered encounter medications on file as of 01/15/2022.   Thank you for the opportunity to participate in the care of Courtney Mack.  The palliative care team will continue to follow. Please call our office at [phone] if we can be of additional assistance.   Ezekiel Slocumb, NP   COVID-19 PATIENT SCREENING TOOL Asked and negative response unless otherwise noted:  Have you had symptoms of covid, tested positive or been in contact with someone with symptoms/positive test in the past 5-10 days? No

## 2022-01-22 ENCOUNTER — Other Ambulatory Visit: Payer: Self-pay | Admitting: *Deleted

## 2022-01-22 MED ORDER — RIVAROXABAN 10 MG PO TABS: ORAL_TABLET | ORAL | 0 refills | Status: DC

## 2022-01-28 ENCOUNTER — Other Ambulatory Visit: Payer: Self-pay

## 2022-01-28 ENCOUNTER — Encounter: Payer: Self-pay | Admitting: Family Medicine

## 2022-01-28 ENCOUNTER — Other Ambulatory Visit: Payer: Medicare (Managed Care)

## 2022-01-29 ENCOUNTER — Encounter: Payer: Self-pay | Admitting: Internal Medicine

## 2022-01-29 ENCOUNTER — Inpatient Hospital Stay
Admission: EM | Admit: 2022-01-29 | Discharge: 2022-02-02 | DRG: 378 | Disposition: A | Discharge status: Home Health | Payer: Medicare (Managed Care) | Attending: Internal Medicine | Admitting: Internal Medicine

## 2022-01-29 ENCOUNTER — Other Ambulatory Visit: Payer: Self-pay

## 2022-01-29 ENCOUNTER — Emergency Department: Payer: Medicare (Managed Care)

## 2022-01-29 DIAGNOSIS — I5032 Chronic diastolic (congestive) heart failure: Secondary | ICD-10-CM | POA: Diagnosis present

## 2022-01-29 DIAGNOSIS — Z83438 Family history of other disorder of lipoprotein metabolism and other lipidemia: Secondary | ICD-10-CM | POA: Diagnosis not present

## 2022-01-29 DIAGNOSIS — E119 Type 2 diabetes mellitus without complications: Secondary | ICD-10-CM

## 2022-01-29 DIAGNOSIS — I959 Hypotension, unspecified: Secondary | ICD-10-CM | POA: Diagnosis present

## 2022-01-29 DIAGNOSIS — D62 Acute posthemorrhagic anemia: Secondary | ICD-10-CM | POA: Diagnosis present

## 2022-01-29 DIAGNOSIS — Z803 Family history of malignant neoplasm of breast: Secondary | ICD-10-CM

## 2022-01-29 DIAGNOSIS — R109 Unspecified abdominal pain: Secondary | ICD-10-CM | POA: Diagnosis present

## 2022-01-29 DIAGNOSIS — R310 Gross hematuria: Secondary | ICD-10-CM | POA: Diagnosis present

## 2022-01-29 DIAGNOSIS — Z8249 Family history of ischemic heart disease and other diseases of the circulatory system: Secondary | ICD-10-CM

## 2022-01-29 DIAGNOSIS — Z8616 Personal history of COVID-19: Secondary | ICD-10-CM

## 2022-01-29 DIAGNOSIS — K31811 Angiodysplasia of stomach and duodenum with bleeding: Principal | ICD-10-CM | POA: Diagnosis present

## 2022-01-29 DIAGNOSIS — D649 Anemia, unspecified: Principal | ICD-10-CM

## 2022-01-29 DIAGNOSIS — Z7951 Long term (current) use of inhaled steroids: Secondary | ICD-10-CM

## 2022-01-29 DIAGNOSIS — Z86718 Personal history of other venous thrombosis and embolism: Secondary | ICD-10-CM

## 2022-01-29 DIAGNOSIS — K922 Gastrointestinal hemorrhage, unspecified: Secondary | ICD-10-CM | POA: Diagnosis present

## 2022-01-29 DIAGNOSIS — E669 Obesity, unspecified: Secondary | ICD-10-CM | POA: Diagnosis present

## 2022-01-29 DIAGNOSIS — Z79899 Other long term (current) drug therapy: Secondary | ICD-10-CM | POA: Diagnosis not present

## 2022-01-29 DIAGNOSIS — Z833 Family history of diabetes mellitus: Secondary | ICD-10-CM | POA: Diagnosis not present

## 2022-01-29 DIAGNOSIS — A419 Sepsis, unspecified organism: Secondary | ICD-10-CM | POA: Diagnosis present

## 2022-01-29 DIAGNOSIS — Z936 Other artificial openings of urinary tract status: Secondary | ICD-10-CM | POA: Diagnosis not present

## 2022-01-29 DIAGNOSIS — E7800 Pure hypercholesterolemia, unspecified: Secondary | ICD-10-CM | POA: Diagnosis present

## 2022-01-29 DIAGNOSIS — Z86711 Personal history of pulmonary embolism: Secondary | ICD-10-CM | POA: Diagnosis not present

## 2022-01-29 DIAGNOSIS — Z95828 Presence of other vascular implants and grafts: Secondary | ICD-10-CM | POA: Diagnosis not present

## 2022-01-29 DIAGNOSIS — I509 Heart failure, unspecified: Secondary | ICD-10-CM

## 2022-01-29 DIAGNOSIS — Z8542 Personal history of malignant neoplasm of other parts of uterus: Secondary | ICD-10-CM | POA: Diagnosis not present

## 2022-01-29 DIAGNOSIS — Z87440 Personal history of urinary (tract) infections: Secondary | ICD-10-CM

## 2022-01-29 DIAGNOSIS — T45515A Adverse effect of anticoagulants, initial encounter: Secondary | ICD-10-CM | POA: Diagnosis present

## 2022-01-29 DIAGNOSIS — N1330 Unspecified hydronephrosis: Secondary | ICD-10-CM | POA: Diagnosis present

## 2022-01-29 DIAGNOSIS — R42 Dizziness and giddiness: Secondary | ICD-10-CM

## 2022-01-29 DIAGNOSIS — K31819 Angiodysplasia of stomach and duodenum without bleeding: Secondary | ICD-10-CM | POA: Diagnosis present

## 2022-01-29 DIAGNOSIS — D500 Iron deficiency anemia secondary to blood loss (chronic): Secondary | ICD-10-CM | POA: Diagnosis present

## 2022-01-29 DIAGNOSIS — D6832 Hemorrhagic disorder due to extrinsic circulating anticoagulants: Secondary | ICD-10-CM | POA: Diagnosis present

## 2022-01-29 DIAGNOSIS — N390 Urinary tract infection, site not specified: Secondary | ICD-10-CM | POA: Diagnosis present

## 2022-01-29 DIAGNOSIS — Z7901 Long term (current) use of anticoagulants: Secondary | ICD-10-CM | POA: Diagnosis not present

## 2022-01-29 DIAGNOSIS — Z6836 Body mass index (BMI) 36.0-36.9, adult: Secondary | ICD-10-CM

## 2022-01-29 DIAGNOSIS — I110 Hypertensive heart disease with heart failure: Secondary | ICD-10-CM | POA: Diagnosis present

## 2022-01-29 LAB — URINALYSIS, ROUTINE W REFLEX MICROSCOPIC
Bilirubin Urine: NEGATIVE
Glucose, UA: NEGATIVE mg/dL
Hgb urine dipstick: NEGATIVE
Ketones, ur: NEGATIVE mg/dL
Nitrite: NEGATIVE
Protein, ur: 100 mg/dL — AB
Specific Gravity, Urine: 1.015 (ref 1.005–1.030)
WBC, UA: >50 WBC/hpf — ABNORMAL HIGH (ref 0–5)
pH: 5 (ref 5.0–8.0)

## 2022-01-29 LAB — CBC
HCT: 16.2 % — ABNORMAL LOW (ref 36.0–46.0)
Hemoglobin: 4.7 g/dL — CL (ref 12.0–15.0)
MCH: 23.5 pg — ABNORMAL LOW (ref 26.0–34.0)
MCHC: 29 g/dL — ABNORMAL LOW (ref 30.0–36.0)
MCV: 81 fL (ref 80.0–100.0)
Platelets: 246 10*3/uL (ref 150–400)
RBC: 2 MIL/uL — ABNORMAL LOW (ref 3.87–5.11)
RDW: 18.5 % — ABNORMAL HIGH (ref 11.5–15.5)
WBC: 5.6 10*3/uL (ref 4.0–10.5)
nRBC: 0 % (ref 0.0–0.2)

## 2022-01-29 LAB — PREPARE RBC (CROSSMATCH)

## 2022-01-29 LAB — BASIC METABOLIC PANEL
Anion gap: 8 (ref 5–15)
BUN: 29 mg/dL — ABNORMAL HIGH (ref 8–23)
CO2: 24 mmol/L (ref 22–32)
Calcium: 9.4 mg/dL (ref 8.9–10.3)
Chloride: 104 mmol/L (ref 98–111)
Creatinine, Ser: 0.9 mg/dL (ref 0.44–1.00)
GFR, Estimated: >60 mL/min (ref >60)
Glucose, Bld: 115 mg/dL — ABNORMAL HIGH (ref 70–99)
Potassium: 3.5 mmol/L (ref 3.5–5.1)
Sodium: 136 mmol/L (ref 135–145)

## 2022-01-29 LAB — TROPONIN I (HIGH SENSITIVITY)
Troponin I (High Sensitivity): 5 ng/L (ref <18)
Troponin I (High Sensitivity): 5 ng/L (ref <18)

## 2022-01-29 LAB — D-DIMER, QUANTITATIVE: D-Dimer, Quant: 0.5 ug/mL-FEU (ref 0.00–0.50)

## 2022-01-29 LAB — BRAIN NATRIURETIC PEPTIDE: B Natriuretic Peptide: 66.2 pg/mL (ref 0.0–100.0)

## 2022-01-29 MED ORDER — ACETAMINOPHEN 325 MG PO TABS: 650.0000 mg | ORAL_TABLET | Freq: Four times a day (QID) | ORAL | Status: DC | PRN

## 2022-01-29 MED ORDER — ACETAMINOPHEN 650 MG RE SUPP: 650.0000 mg | Freq: Four times a day (QID) | RECTAL | Status: DC | PRN

## 2022-01-29 MED ORDER — LACTATED RINGERS IV SOLN: INTRAVENOUS | Status: AC

## 2022-01-29 MED ORDER — ONDANSETRON HCL 4 MG/2ML IJ SOLN: 4.0000 mg | Freq: Four times a day (QID) | INTRAMUSCULAR | Status: DC | PRN

## 2022-01-29 MED ORDER — ONDANSETRON HCL 4 MG PO TABS: 4.0000 mg | ORAL_TABLET | Freq: Four times a day (QID) | ORAL | Status: DC | PRN

## 2022-01-29 MED ORDER — PANTOPRAZOLE INFUSION (NEW) - SIMPLE MED
8.0000 mg/h | INTRAVENOUS | Status: DC
  Administered 2022-01-30 – 2022-01-31 (×5): 8 mg/h via INTRAVENOUS
  Filled 2022-01-29 (×6): qty 100

## 2022-01-29 MED ORDER — SODIUM CHLORIDE 0.9 % IV BOLUS
1000.0000 mL | Freq: Once | INTRAVENOUS | Status: AC
  Administered 2022-01-29: 1000 mL via INTRAVENOUS

## 2022-01-29 MED ORDER — PANTOPRAZOLE 80MG IVPB - SIMPLE MED
80.0000 mg | Freq: Once | INTRAVENOUS | Status: AC
  Administered 2022-01-30: 80 mg via INTRAVENOUS
  Filled 2022-01-29: qty 100

## 2022-01-29 MED ORDER — PANTOPRAZOLE SODIUM 40 MG IV SOLR
40.0000 mg | Freq: Once | INTRAVENOUS | Status: AC
  Administered 2022-01-29: 40 mg via INTRAVENOUS
  Filled 2022-01-29: qty 10

## 2022-01-29 MED ORDER — SODIUM CHLORIDE 0.9 % IV SOLN: 10.0000 mL/h | Freq: Once | INTRAVENOUS | Status: DC

## 2022-01-29 NOTE — H&P (Signed)
History and Physical    Courtney Mack JSH:[id number] DOB: 1955/11/11 DOA: 01/29/2022  PCP: [facility] [place]    Patient coming from:  Home   Chief Complaint:  Dizziness    HPI:  Courtney Mack is a 66 y.o. female seen in ed with complaints of dizziness ,chest pressure  shortness of breath, patient reports diarrhea that is dark in color no hematochezia. Pt has past medical history of recurrent DVT and massive PE and February 2022 and June 2022 on Xarelto status post thrombectomy and IVC filter placement, history of GI bleed from gastric antral vascular ectasia in December 2022 and history of endometrial cancer, heart failure, COVID, iron deficiency anemia, recurrent UTIs .  Patient's last discharge was December 31, 2021 for gross hematuria that was attributed to radiation cystitis and Xarelto was held with continuous bladder irrigation discharge was cleared by urology and outpatient follow-up for cystoscopy and left ureteric stent removal by Dr. Bernardo Mack.  Patient was found to be positive for Enterococcus Faecium with discharge on Linezolid.  Patient during that admission was also found to have anemia and required 1 unit PRBC and continued on iron supplementation.  Patient follows with oncology for recurrent DVT, anemia and anticoagulation.  Patient is followed by a [facility] and is currently full code. Chest pressure on exertion. Pt still has left sided nephrostomy tube and drainage is clear. Pt is high risk for decline and has guarded prognosis with her history and h/o bleeding and GAVE and PE. Per pt she started to have dark diarrhea multiple loose stools all since yesterday.  ED Course:   Vitals:   01/29/22 2300 01/30/22 0000 01/30/22 0054 01/30/22 0109  BP: (!) 111/50 (!) 97/56 (!) 100/52 (!) 99/47  Pulse: 94 87 89 89  Resp: 19 (!) '24 20 18  '$ Temp:   98.5 F (36.9 C) 98.4 F (36.9 C)  TempSrc:   Oral Oral  SpO2:    100%  Weight:      Height:       Patient is alert awake oriented O2 sats of 100% on room air. Vitals in the emergency room show low blood pressure and hypotension with initial blood pressures of 94/37 respiratory rate of 26 and heart rate of 90 meeting SIRS criteria/suspect sepsis as patient has a history of UTIs recently treated BMP shows BUN of 29 otherwise normal electrolytes and kidney function. BNP of 66.2 Troponin of 5 and repeat troponin of 5 CBC shows white count of 5.6 hemoglobin of 4.7, platelet count of 246/MCV of 81 RDW of 18.5, D-dimer 0.50 PT/INR PTT-pending Urinalysis shows cloudy urine yellow in color large leukocytes more than 50 WBCs and 0-5 RBCs 100 protein. April 2023 endoscopy: -Esophageal plaques suspicious for candidiasis       -Gastric antral vascular ectasia without bleeding and treated with argon plasma coagulation       -Normal examined duodenum, no signs of active bleeding. Small bowel endoscopy 08/17/2021: There was no evidence of significant pathology in the entire examined portion of jejunum. Findings: The examined duodenum was normal. The esophagus was normal. Moderate, diffuse gastric antral vascular ectasia without bleeding was present in the prepyloric region of the stomach. Coagulation for hemostasis using argon plasma was successful. Estimated blood loss: none. Diffuse atrophic mucosa was found in the entire examined stomach. Biopsies were taken with a cold forceps for histology. Estimated blood loss: none. The gastroesophageal junction and examined esophagus were normal. Colonoscopy June 27, 2021: - Internal hemorrhoids  that do not return to the anal canal, thus continuously prolapsed (Grade IV) found on perianal exam. - Mild diverticulosis in the sigmoid colon. There was no evidence of diverticular bleeding. - One 5 mm polyp in the cecum, removed with a jumbo cold forceps. Resected and retrieved. - Non-bleeding internal hemorrhoids. - The examined portion of the ileum was  normal. - The examination was otherwise normal.  >> 2D echocardiogram March 2023:  1. Left ventricular ejection fraction, by estimation, is 55 to 60%. The  left ventricle has normal function. The left ventricle has no regional  wall motion abnormalities. There is mild asymmetric left ventricular  hypertrophy of the basal-septal segment.  Left ventricular diastolic parameters were normal.   2. Right ventricular systolic function is normal. The right ventricular  size is normal.   3. Left atrial size was mildly dilated.   4. The mitral valve is normal in structure. Mild mitral valve  regurgitation. No evidence of mitral stenosis.   5. The aortic valve is normal in structure. Aortic valve regurgitation is  not visualized. No aortic stenosis is present.   6. The inferior vena cava is normal in size with greater than 50%  respiratory variability, suggesting right atrial pressure of 3 mmHg.   Ordered and pending: -CT angio abdomen and pelvis for GI bleed. -PT/INR PTT. -GI consult-Dr. Marius Ditch. -PRBC x3 unit typed and crossed for ED provider. -IV PPI therapy.   Review of Systems:  Review of Systems  Constitutional:  Positive for malaise/fatigue.  Cardiovascular:        Chest pressure.   Neurological:  Positive for weakness.    Past Medical History:  Diagnosis Date   Acute bilateral deep vein thrombosis (DVT) of femoral veins ([place]) 01/02/2021   Acute massive pulmonary embolism ([place]) 08/25/2020   Last Assessment & Plan:  Formatting of this note might be different from the original. Post vena caval filter and on xarelto and O2   Anemia    ARF (acute respiratory failure) ([place])    Cancer ([place])    endometrial   Cellulitis of left lower leg 06/20/2021   CHF (congestive heart failure) ([place])    COVID-19    Diabetes mellitus without complication ([place])    DVT (deep venous thrombosis) ([place]) 09/06/2020   High cholesterol    Hx of blood clots    Hyperlipidemia    Hypertension    IDA  (iron deficiency anemia) 09/21/2020   Obesity    Pressure injury of skin 06/21/2021   Pulmonary embolism ([place])    Sepsis ([place])    Thrombocytopenia ([place])    Urinary tract infection 09/13/2021    Past Surgical History:  Procedure Laterality Date   COLONOSCOPY N/A 06/21/2021   Procedure: COLONOSCOPY;  Surgeon: Toledo, Benay Pike, MD;  Location: [facility] ENDOSCOPY;  Service: Gastroenterology;  Laterality: N/A;   CYSTOSCOPY W/ URETERAL STENT PLACEMENT Left 09/11/2021   Procedure: CYSTOSCOPY WITH RETROGRADE PYELOGRAM/URETERAL STENT PLACEMENT;  Surgeon: Abbie Sons, MD;  Location: [facility] ORS;  Service: Urology;  Laterality: Left;   CYSTOSCOPY W/ URETERAL STENT PLACEMENT Left 11/17/2021   Procedure: CYSTOSCOPY WITH STENT REPLACEMENT;  Surgeon: Irine Seal, MD;  Location: [facility] ORS;  Service: Urology;  Laterality: Left;   CYSTOSCOPY WITH FULGERATION  11/17/2021   Procedure: CYSTOSCOPY WITH FULGERATION;  Surgeon: Irine Seal, MD;  Location: [facility] ORS;  Service: Urology;;   CYSTOSCOPY WITH STENT PLACEMENT Left 10/17/2021   Procedure: CYSTOSCOPY WITH STENT PLACEMENT;  Surgeon: Abbie Sons, MD;  Location: [facility] ORS;  Service: Urology;  Laterality: Left;   CYSTOSCOPY WITH URETEROSCOPY, STONE BASKETRY AND STENT PLACEMENT Left 10/16/2021   Procedure: CYSTOSCOPY WITH URETEROSCOPY  AND STENT REMOVAL;  Surgeon: Abbie Sons, MD;  Location: [facility] ORS;  Service: Urology;  Laterality: Left;   ENTEROSCOPY N/A 08/17/2021   Procedure: ENTEROSCOPY;  Surgeon: Lin Landsman, MD;  Location: [facility] ENDOSCOPY;  Service: Gastroenterology;  Laterality: N/A;   ESOPHAGOGASTRODUODENOSCOPY N/A 06/21/2021   Procedure: ESOPHAGOGASTRODUODENOSCOPY (EGD);  Surgeon: Toledo, Benay Pike, MD;  Location: [facility] ENDOSCOPY;  Service: Gastroenterology;  Laterality: N/A;   ESOPHAGOGASTRODUODENOSCOPY (EGD) WITH PROPOFOL N/A 08/17/2021   Procedure: ESOPHAGOGASTRODUODENOSCOPY (EGD) WITH PROPOFOL;  Surgeon: Lin Landsman, MD;  Location:  [facility] ENDOSCOPY;  Service: Gastroenterology;  Laterality: N/A;   ESOPHAGOGASTRODUODENOSCOPY (EGD) WITH PROPOFOL N/A 10/14/2021   Procedure: ESOPHAGOGASTRODUODENOSCOPY (EGD) WITH PROPOFOL;  Surgeon: [name], MD;  Location: [facility] ENDOSCOPY;  Service: Endoscopy;  Laterality: N/A;   GIVENS CAPSULE STUDY N/A 06/22/2021   Procedure: GIVENS CAPSULE STUDY;  Surgeon: Toledo, Benay Pike, MD;  Location: [facility] ENDOSCOPY;  Service: Gastroenterology;  Laterality: N/A;   IR NEPHROSTOMY PLACEMENT LEFT  12/27/2021   IVC FILTER INSERTION N/A 08/18/2020   Procedure: IVC FILTER INSERTION;  Surgeon: Katha Cabal, MD;  Location: [place] CV LAB;  Service: Cardiovascular;  Laterality: N/A;   PERIPHERAL VASCULAR THROMBECTOMY Bilateral 01/03/2021   Procedure: PERIPHERAL VASCULAR THROMBECTOMY;  Surgeon: Algernon Huxley, MD;  Location: [place] CV LAB;  Service: Cardiovascular;  Laterality: Bilateral;   PULMONARY THROMBECTOMY N/A 08/18/2020   Procedure: PULMONARY THROMBECTOMY;  Surgeon: Katha Cabal, MD;  Location: [place] [facility] LAB;  Service: Cardiovascular;  Laterality: N/A;   TUBAL LIGATION     VISCERAL ANGIOGRAPHY N/A 07/18/2021   Procedure: VISCERAL ANGIOGRAPHY;  Surgeon: Algernon Huxley, MD;  Location: [place] CV LAB;  Service: Cardiovascular;  Laterality: N/A;     reports that she has never smoked. She has never used smokeless tobacco. She reports that she does not currently use alcohol. She reports that she does not currently use drugs.  No Known Allergies  Family History  Problem Relation Age of Onset   Hypertension Mother    Diabetes Mother    Diabetes Father    Hypertension Father    High Cholesterol Father    Congestive Heart Failure Father    Breast cancer Cousin     Prior to Admission medications   Medication Sig Start Date End Date Taking? Authorizing Provider  acetaminophen (TYLENOL) 500 MG tablet Take 500 mg by mouth every 6 (six) hours as needed for mild pain or fever.     [provider]  Ascorbic Acid (VITAMIN C) 1000 MG tablet Take 1,000 mg by mouth daily.    [provider]  BREO ELLIPTA 100-25 MCG/ACT AEPB Inhale 1 puff into the lungs daily. 11/15/21   [provider]  Ferrous Sulfate (IRON) 325 (65 Fe) MG TABS Take 325 mg by mouth every other day. 07/05/21   [provider]  furosemide (LASIX) 20 MG tablet Hold due to low blood pressure. 12/31/21   Enzo Bi, MD  loperamide (IMODIUM) 2 MG capsule Take 1 capsule (2 mg total) by mouth as needed for diarrhea or loose stools. 06/26/21   Sreenath, Trula Slade, MD  mometasone-formoterol (DULERA) 100-5 MCG/ACT AERO Inhale 2 puffs into the lungs 2 (two) times daily. Patient not taking: Reported on 01/15/2022    [provider]  Multiple Vitamin (MULTIVITAMIN WITH MINERALS) TABS tablet Take 1 tablet by  mouth daily. 09/12/20   [name], MD  pantoprazole (PROTONIX) 40 MG tablet Take 40 mg by mouth every morning.    [provider]  rivaroxaban (XARELTO) 10 MG TABS tablet Hold until followup with urology. 01/22/22   Borders, Kirt Boys, NP  rosuvastatin (CRESTOR) 10 MG tablet Take 1 tablet (10 mg total) by mouth daily. Patient taking differently: Take 10 mg by mouth every morning. 10/03/20   Earlie Server, MD    Physical Exam: Vitals:   01/29/22 2300 01/30/22 0000 01/30/22 0054 01/30/22 0109  BP: (!) 111/50 (!) 97/56 (!) 100/52 (!) 99/47  Pulse: 94 87 89 89  Resp: 19 (!) '24 20 18  '$ Temp:   98.5 F (36.9 C) 98.4 F (36.9 C)  TempSrc:   Oral Oral  SpO2:    100%  Weight:      Height:       Physical Exam Vitals reviewed.  Constitutional:      General: She is not in acute distress.    Appearance: She is obese. She is ill-appearing.     Comments: Pale appearing.   HENT:     Head: Normocephalic and atraumatic.     Right Ear: External ear normal.     Left Ear: External ear normal.  Eyes:     General: Lids are normal.     Extraocular Movements: Extraocular  movements intact.  Cardiovascular:     Rate and Rhythm: Normal rate and regular rhythm.  Pulmonary:     Effort: Pulmonary effort is normal. No tachypnea or bradypnea.     Breath sounds: Normal breath sounds.  Abdominal:     General: Bowel sounds are decreased.     Palpations: Abdomen is soft. There is no mass.     Tenderness: There is generalized abdominal tenderness.     Hernia: No hernia is present.    Musculoskeletal:     Right lower leg: 3+ Edema present.     Left lower leg: 3+ Edema present.  Neurological:     General: No focal deficit present.     Mental Status: She is oriented to person, place, and time.  Psychiatric:        Mood and Affect: Mood normal.        Behavior: Behavior normal. Behavior is cooperative.      Labs on Admission: I have personally reviewed following labs and imaging studies BMET Recent Labs  Lab 01/29/22 1930  NA 136  K 3.5  CL 104  CO2 24  BUN 29*  CREATININE 0.90  GLUCOSE 115*    Current Facility-Administered Medications:    0.9 %  sodium chloride infusion, 10 mL/hr, Intravenous, Once, Para Skeans, MD   acetaminophen (TYLENOL) tablet 650 mg, 650 mg, Oral, Q6H PRN **OR** acetaminophen (TYLENOL) suppository 650 mg, 650 mg, Rectal, Q6H PRN, Florina Ou V, MD   cefTRIAXone (ROCEPHIN) 1 g in sodium chloride 0.9 % 100 mL IVPB, 1 g, Intravenous, Q24H, Posey Pronto, Gretta Cool, MD   lactated ringers infusion, , Intravenous, Continuous, Florina Ou V, MD, Last Rate: 75 mL/hr at 01/30/22 0114, New Bag at 01/30/22 0114   ondansetron (ZOFRAN) tablet 4 mg, 4 mg, Oral, Q6H PRN **OR** ondansetron (ZOFRAN) injection 4 mg, 4 mg, Intravenous, Q6H PRN, Posey Pronto, Caycee Wanat V, MD   pantoprazole (PROTONIX) 80 mg /NS 100 mL IVPB, 80 mg, Intravenous, Once, Florina Ou V, MD, Last Rate: 300 mL/hr at 01/30/22 0117, 80 mg at 01/30/22 0117   pantoprozole (PROTONIX) 80 mg /NS  100 mL infusion, 8 mg/hr, Intravenous, Continuous, Para Skeans, MD  Current Outpatient Medications:     acetaminophen (TYLENOL) 500 MG tablet, Take 500 mg by mouth every 6 (six) hours as needed for mild pain or fever., Disp: , Rfl:    Ascorbic Acid (VITAMIN C) 1000 MG tablet, Take 1,000 mg by mouth daily., Disp: , Rfl:    BREO ELLIPTA 100-25 MCG/ACT AEPB, Inhale 1 puff into the lungs daily., Disp: , Rfl:    Ferrous Sulfate (IRON) 325 (65 Fe) MG TABS, Take 325 mg by mouth every other day., Disp: , Rfl:    furosemide (LASIX) 20 MG tablet, Hold due to low blood pressure., Disp: 30 tablet, Rfl: 11   loperamide (IMODIUM) 2 MG capsule, Take 1 capsule (2 mg total) by mouth as needed for diarrhea or loose stools., Disp: 30 capsule, Rfl: 0   mometasone-formoterol (DULERA) 100-5 MCG/ACT AERO, Inhale 2 puffs into the lungs 2 (two) times daily. (Patient not taking: Reported on 01/15/2022), Disp: , Rfl:    Multiple Vitamin (MULTIVITAMIN WITH MINERALS) TABS tablet, Take 1 tablet by mouth daily., Disp: , Rfl:    pantoprazole (PROTONIX) 40 MG tablet, Take 40 mg by mouth every morning., Disp: , Rfl:    rivaroxaban (XARELTO) 10 MG TABS tablet, Hold until followup with urology., Disp: 30 tablet, Rfl: 0   rosuvastatin (CRESTOR) 10 MG tablet, Take 1 tablet (10 mg total) by mouth daily. (Patient taking differently: Take 10 mg by mouth every morning.), Disp: 15 tablet, Rfl: 0  COVID-19 Labs Recent Labs    01/29/22 1930  DDIMER 0.50   Lab Results  Component Value Date   SARSCOV2NAA NEGATIVE 10/16/2021   SARSCOV2NAA NEGATIVE 09/11/2021   SARSCOV2NAA POSITIVE (A) 08/14/2021   [place] NEGATIVE 07/18/2021    Radiological Exams on Admission: CT ANGIO GI BLEED  Result Date: 01/30/2022 CLINICAL DATA:  Presyncope, black stool per rectum, GI bleed EXAM: CTA ABDOMEN AND PELVIS WITHOUT AND WITH CONTRAST TECHNIQUE: Multidetector CT imaging of the abdomen and pelvis was performed using the standard protocol during bolus administration of intravenous contrast. Multiplanar reconstructed images and MIPs were obtained and  reviewed to evaluate the vascular anatomy. RADIATION DOSE REDUCTION: This exam was performed according to the departmental dose-optimization program which includes automated exposure control, adjustment of the mA and/or kV according to patient size and/or use of iterative reconstruction technique. CONTRAST:  16m OMNIPAQUE IOHEXOL 350 MG/ML SOLN COMPARISON:  CT abdomen/pelvis dated 12/24/2021 FINDINGS: VASCULAR Aorta: No evidence abdominal aortic aneurysm or dissection. Patent. Atherosclerotic calcifications. Celiac: Patent.  Atherosclerotic calcifications at the origin. SMA: Patent.  Atherosclerotic calcifications of the origin. Renals: Patent. Atherosclerotic calcifications at the origin bilaterally. IMA: Patent. Inflow: Patent bilaterally.  Atherosclerotic calcifications. Proximal Outflow: Patent. Veins: IVC filter.  Otherwise unremarkable. No active GI bleeding. See below for additional pertinent findings in the stomach. Review of the MIP images confirms the above findings. NON-VASCULAR Lower chest: Lung bases are clear. Hepatobiliary: Liver is within normal limits. Gallbladder is unremarkable. No intrahepatic or extrahepatic ductal dilatation. Pancreas: Within normal limits. Spleen: Within normal limits. Adrenals/Urinary Tract: Adrenal glands are within normal limits. Left kidney is notable for a percutaneous nephrostomy. Right kidney is within normal limits. No hydronephrosis. Bladder is mildly thick-walled with perivesical stranding (series 7/image 35), although underdistended. Stomach/Bowel: Stomach is notable for mural hyperenhancement with submucosal edema along the gastric antrum (series 12/image 27), suspicious for a gastric ulcer. Slightly asymmetric/focal thickening along the superior wall of the gastric antrum at least raises the  possibility of an underlying lesion in this location. No evidence of bowel obstruction. Normal appendix (series 6/image 152). No colonic wall thickening or inflammatory  changes. Lymphatic: No suspicious abdominopelvic lymphadenopathy. Reproductive: Uterus is within normal limits. No adnexal masses. Other: No abdominopelvic ascites. Musculoskeletal: Degenerative changes of the visualized thoracolumbar spine. IMPRESSION: Suspected gastric ulcer in the gastric antrum. Underlying mass lesion is possible. Upper endoscopy is suggested. No evidence of active GI bleeding. Mildly thick-walled bladder with perivesical stranding, correlate for cystitis. Electronically Signed   By: Julian Hy M.D.   On: 01/30/2022 00:57   DG Chest Port 1 View  Result Date: 01/29/2022 CLINICAL DATA:  Chest pain and shortness of breath. EXAM: PORTABLE CHEST 1 VIEW COMPARISON:  None Available. FINDINGS: The right internal jugular venous catheter seen on the prior study has been removed. The heart size and mediastinal contours are within normal limits. Both lungs are clear. Multilevel degenerative changes are seen throughout the thoracic spine. IMPRESSION: No active cardiopulmonary disease. Electronically Signed   By: Virgina Norfolk M.D.   On: 01/29/2022 20:14    EKG: Independently reviewed.  Sinus rhythm 96 ST-T wave changes, normal PR, normal QT.   Assessment and Plan: * Dizziness on standing Attribute to orthostatic changes from acute anemia.  Fall precaution.  Strict bedrest.    Abdominal pain Generalized abdominal pain with upper abd epigastric and left sided area. PRN iv morphine as needed.  Await CTA to identity problem or active GIB or infection.  With multiple episodes of diarrhea that is bark in color ,. Will get stool cultures follow.  Gastric antral vascular ectasia Suspect GAVE/ diverticular bleed/ infection. GI consulted but will follow CTA and seek gen surg or Interventional radiology as needed.   Anemia due to GI blood loss, symptomatic with fatigue/lightheadedness and shortness of breath IV PPI infusion. GI Consulted.  Dr Marius Ditch consulted by edmd. Type /  cross/ transfuse.  CTA is neg for active bleed. Npo after midnight.    UTI (urinary tract infection) Recently completed treatment with linezolid. Urinalysis shows in WBC> 50 and we will start rocephin.    Sepsis ([place]) Septic shock is also a differential in this patient. We will cont with cultures and follow c/s.  Diabetes mellitus without complication ([place]) NPO. Accucheck q4 hours.  MIVF at low rate.   Chronic diastolic CHF (congestive heart failure) (H[facility]) Compensated. High risk for decompensation with 3 units PRBC. D/W team on call to follow.   Hydronephrosis, left S/P left nephrostomy.  Being followed by urology.     DVT prophylaxis:  SCDs  Code Status:  Full code  Family Communication:  Clyde Lundborg (Daughter)  [phone] (Mobile)   Disposition Plan:  To be determined  Consults called:  GI-Dr. Marius Ditch  Admission status: Inpatient.   Para Skeans MD [facility]  6 PM- 2 AM. Please contact me via secure Chat 6 PM-2 AM. [phone] ( Pager ) To contact the [facility] Attending or Consulting provider [place] or covering provider during after hours [name], for this patient.   Check the care team in [facility] and look for a) attending/consulting [facility] provider listed and b) the [facility] team listed Log into [email or web address] and use [place]'s universal password to access. If you do not have the password, please contact the hospital operator. Locate the [facility] provider you are looking for under [facility] and page to a number that you can be directly reached. If you still have difficulty reaching the provider, please page  the [facility] (Director on Call) for the Hospitalists listed on amion for assistance. [email or web address] 01/30/2022, 1:23 AM

## 2022-01-29 NOTE — ED Triage Notes (Signed)
To triage with c/o " my head doesn't feel right." C/o chest pain and shortness of breath. Also c/o back and leg pain with hot flashes.  Sx began yesterday.  Hx of CHF, PE. Nephrostomy tube placed to left side last month.  Edema noted to BLE

## 2022-01-29 NOTE — ED Provider Notes (Signed)
[facility] Provider Note   Event Date/Time   First MD Initiated Contact with Patient 01/29/22 1956     (approximate) History  Chest Pain  HPI Courtney Mack is a 66 y.o. female with recurrent symptomatic anemia who presents for headache, presyncopal symptoms, and stating that she has felt general fatigue over the last 24 hours.  Patient's daughter at bedside and provides some of this history stating that she has had issues with blood in her urine as well as a bleeding stomach ulcer that is caused her to be anemic in the past and required blood transfusions as well as an EGD.  Patient states that she had a nephrostomy tube placed recently in order to try to quell the bleeding from her bladder and patient states that she has had no blood from this nephrostomy.  Patient does endorse darkly colored diarrhea over the past 24 hours but no bright red blood per rectum ROS: Patient currently denies any vision changes, tinnitus, difficulty speaking, facial droop, sore throat, chest pain, shortness of breath, abdominal pain, nausea/vomiting, dysuria, or numbness/paresthesias in any extremity   Physical Exam  Triage Vital Signs: ED Triage Vitals  Enc Vitals Group     BP 01/29/22 1929 110/60     Pulse Rate 01/29/22 1929 96     Resp 01/29/22 1929 20     Temp 01/29/22 1929 98.2 F (36.8 C)     Temp Source 01/29/22 1929 Oral     SpO2 01/29/22 1929 100 %     Weight 01/29/22 1925 180 lb (81.6 kg)     Height 01/29/22 1925 '4\' 11"'$  (1.499 m)     Head Circumference --      Peak Flow --      Pain Score 01/29/22 1924 10     Pain Loc --      Pain Edu? --      Excl. in [place]? --    Most recent vital signs: Vitals:   01/29/22 1929  BP: 110/60  Pulse: 96  Resp: 20  Temp: 98.2 F (36.8 C)  SpO2: 100%   General: Awake, oriented x4.  Generalized pallor CV:  Good peripheral perfusion.  Resp:  Normal effort.  Abd:  No distention.  Other:  Overweight Caucasian female laying in  bed in no acute distress.  Nephrostomy tube in place and draining yellow fluid without evidence of gross hematuria ED Results / Procedures / Treatments  Labs (all labs ordered are listed, but only abnormal results are displayed) Labs Reviewed  BASIC METABOLIC PANEL - Abnormal; Notable for the following components:      Result Value   Glucose, Bld 115 (*)    BUN 29 (*)    All other components within normal limits  CBC - Abnormal; Notable for the following components:   RBC 2.00 (*)    Hemoglobin 4.7 (*)    HCT 16.2 (*)    MCH 23.5 (*)    MCHC 29.0 (*)    RDW 18.5 (*)    All other components within normal limits  D-DIMER, QUANTITATIVE  BRAIN NATRIURETIC PEPTIDE  URINALYSIS, ROUTINE W REFLEX MICROSCOPIC  PREPARE RBC (CROSSMATCH)  TYPE AND SCREEN  TROPONIN I (HIGH SENSITIVITY)  TROPONIN I (HIGH SENSITIVITY)   EKG ED ECG REPORT I, Naaman Plummer, the attending physician, personally viewed and interpreted this ECG. Date: 01/29/2022 EKG Time: 1929 Rate: 96 Rhythm: normal sinus rhythm QRS Axis: normal Intervals: normal ST/T Wave abnormalities: normal Narrative Interpretation: no  evidence of acute ischemia RADIOLOGY ED MD interpretation: One-view portable chest x-ray interpreted by me shows no evidence of acute abnormalities including no pneumonia, pneumothorax, or widened mediastinum -Agree with radiology assessment Official radiology report(s): DG Chest Port 1 View  Result Date: 01/29/2022 CLINICAL DATA:  Chest pain and shortness of breath. EXAM: PORTABLE CHEST 1 VIEW COMPARISON:  None Available. FINDINGS: The right internal jugular venous catheter seen on the prior study has been removed. The heart size and mediastinal contours are within normal limits. Both lungs are clear. Multilevel degenerative changes are seen throughout the thoracic spine. IMPRESSION: No active cardiopulmonary disease. Electronically Signed   By: Virgina Norfolk M.D.   On: 01/29/2022 20:14    PROCEDURES: Critical Care performed: Yes, see critical care procedure note(s) .1-3 Lead EKG Interpretation  Performed by: Naaman Plummer, MD Authorized by: Naaman Plummer, MD     Interpretation: normal     ECG rate:  95   ECG rate assessment: normal     Rhythm: sinus rhythm     Ectopy: none     Conduction: normal   CRITICAL CARE Performed by: Naaman Plummer  Total critical care time: 37 minutes  Critical care time was exclusive of separately billable procedures and treating other patients.  Critical care was necessary to treat or prevent imminent or life-threatening deterioration.  Critical care was time spent personally by me on the following activities: development of treatment plan with patient and/or surrogate as well as nursing, discussions with consultants, evaluation of patient's response to treatment, examination of patient, obtaining history from patient or surrogate, ordering and performing treatments and interventions, ordering and review of laboratory studies, ordering and review of radiographic studies, pulse oximetry and re-evaluation of patient's condition.  MEDICATIONS ORDERED IN ED: Medications  0.9 %  sodium chloride infusion (has no administration in time range)  pantoprazole (PROTONIX) injection 40 mg (has no administration in time range)   IMPRESSION / MDM / ASSESSMENT AND PLAN / ED COURSE  I reviewed the triage vital signs and the nursing notes.                             The patient is on the cardiac monitor to evaluate for evidence of arrhythmia and/or significant heart rate changes. Patient's presentation is most consistent with acute presentation with potential threat to life or bodily function. + Presyncopal symptoms + black stool per rectum Given history and exam patients presentation most consistent with upper GI bleed possibly secondary to peptic ulcer disease or variceal bleeding. I have low suspicion for aortoenteric fistula, ENT bleeding  mimic, [place], Pulmonary bleeding mimic.  Workup: CBC, BMP, LFTs, Lipase, PT/INR, Type and Screen  Interventions: Analgesia and antiemetic medications PRN Protonix '40mg'$  IVP PRBC transfusion  Findings: Hb: 4.7  Disposition: Admit for close monitoring.   FINAL CLINICAL IMPRESSION(S) / ED DIAGNOSES   Final diagnoses:  Symptomatic anemia  Acute upper GI bleeding   Rx / DC Orders   ED Discharge Orders     None      Note:  This document was prepared using Dragon voice recognition software and may include unintentional dictation errors.   Naaman Plummer, MD 01/29/22 2100

## 2022-01-30 ENCOUNTER — Inpatient Hospital Stay: Payer: Medicare (Managed Care)

## 2022-01-30 ENCOUNTER — Encounter: Payer: Self-pay | Admitting: Internal Medicine

## 2022-01-30 DIAGNOSIS — D62 Acute posthemorrhagic anemia: Secondary | ICD-10-CM | POA: Diagnosis not present

## 2022-01-30 DIAGNOSIS — N1330 Unspecified hydronephrosis: Secondary | ICD-10-CM | POA: Diagnosis not present

## 2022-01-30 DIAGNOSIS — I5032 Chronic diastolic (congestive) heart failure: Secondary | ICD-10-CM | POA: Diagnosis not present

## 2022-01-30 DIAGNOSIS — R42 Dizziness and giddiness: Secondary | ICD-10-CM

## 2022-01-30 DIAGNOSIS — D649 Anemia, unspecified: Secondary | ICD-10-CM | POA: Diagnosis not present

## 2022-01-30 DIAGNOSIS — K31819 Angiodysplasia of stomach and duodenum without bleeding: Secondary | ICD-10-CM

## 2022-01-30 DIAGNOSIS — K922 Gastrointestinal hemorrhage, unspecified: Secondary | ICD-10-CM | POA: Diagnosis present

## 2022-01-30 LAB — CBC
HCT: 25.4 % — ABNORMAL LOW (ref 36.0–46.0)
Hemoglobin: 8 g/dL — ABNORMAL LOW (ref 12.0–15.0)
MCH: 26.8 pg (ref 26.0–34.0)
MCHC: 31.5 g/dL (ref 30.0–36.0)
MCV: 84.9 fL (ref 80.0–100.0)
Platelets: 198 10*3/uL (ref 150–400)
RBC: 2.99 MIL/uL — ABNORMAL LOW (ref 3.87–5.11)
RDW: 16.4 % — ABNORMAL HIGH (ref 11.5–15.5)
WBC: 3.9 10*3/uL — ABNORMAL LOW (ref 4.0–10.5)
nRBC: 0 % (ref 0.0–0.2)

## 2022-01-30 LAB — LACTIC ACID, PLASMA: Lactic Acid, Venous: 1.1 mmol/L (ref 0.5–1.9)

## 2022-01-30 LAB — FOLATE: Folate: 29 ng/mL (ref >5.9)

## 2022-01-30 LAB — PROTIME-INR
INR: 2.1 — ABNORMAL HIGH (ref 0.8–1.2)
Prothrombin Time: 23.3 seconds — ABNORMAL HIGH (ref 11.4–15.2)

## 2022-01-30 LAB — MRSA NEXT GEN BY PCR, NASAL: MRSA by PCR Next Gen: DETECTED — AB

## 2022-01-30 MED ORDER — MUPIROCIN 2 % EX OINT
1.0000 | TOPICAL_OINTMENT | Freq: Two times a day (BID) | CUTANEOUS | Status: DC
  Administered 2022-01-30 – 2022-02-02 (×6): 1 via NASAL
  Filled 2022-01-30: qty 22

## 2022-01-30 MED ORDER — IOHEXOL 350 MG/ML SOLN
100.0000 mL | Freq: Once | INTRAVENOUS | Status: AC | PRN
  Administered 2022-01-30: 100 mL via INTRAVENOUS

## 2022-01-30 MED ORDER — VITAMIN B-12 1000 MCG PO TABS
1000.0000 ug | ORAL_TABLET | Freq: Every day | ORAL | Status: DC
  Administered 2022-01-30 – 2022-02-02 (×3): 1000 ug via ORAL
  Filled 2022-01-30 (×4): qty 1

## 2022-01-30 MED ORDER — SODIUM CHLORIDE 0.9 % IV SOLN
1.0000 g | INTRAVENOUS | Status: DC
  Administered 2022-01-30: 1 g via INTRAVENOUS
  Filled 2022-01-30: qty 10

## 2022-01-30 NOTE — Assessment & Plan Note (Signed)
NPO. Accucheck q4 hours.  MIVF at low rate.

## 2022-01-30 NOTE — ED Notes (Signed)
Informed RN bed assigned 

## 2022-01-30 NOTE — Assessment & Plan Note (Addendum)
IV PPI infusion. GI Consulted.  Dr Marius Ditch consulted by edmd. Type / cross/ transfuse.  CTA is neg for active bleed. Npo after midnight.

## 2022-01-30 NOTE — ED Notes (Signed)
Patient eating lunch tray (liquid diet)

## 2022-01-30 NOTE — Assessment & Plan Note (Signed)
Compensated. High risk for decompensation with 3 units PRBC. D/W team on call to follow.

## 2022-01-30 NOTE — ED Notes (Signed)
CT notified pt has 20G IV

## 2022-01-30 NOTE — Plan of Care (Signed)
  Problem: Education: Goal: Knowledge of General Education information will improve Description Including pain rating scale, medication(s)/side effects and non-pharmacologic comfort measures Outcome: Progressing   Problem: Health Behavior/Discharge Planning: Goal: Ability to manage health-related needs will improve Outcome: Progressing   

## 2022-01-30 NOTE — Assessment & Plan Note (Signed)
S/P left nephrostomy.  Being followed by urology.

## 2022-01-30 NOTE — Assessment & Plan Note (Deleted)
Compensated. High risk for decompensation with 3 units PRBC. D/W team on call to follow.

## 2022-01-30 NOTE — Assessment & Plan Note (Signed)
Septic shock is also a differential in this patient. We will cont with cultures and follow c/s.

## 2022-01-30 NOTE — Consult Note (Addendum)
Courtney Darby, MD [address]  [place]  [place], [place] [address]  Main: [phone]  Fax: [phone] Pager: [phone]   Consultation  Referring Provider:     No ref. provider found Primary Care Physician:  [facility] Primary Gastroenterologist:  Dr. Alice Reichert      Reason for Consultation:     Acute blood loss anemia  Date of Admission:  01/29/2022 Date of Consultation:  01/30/2022         HPI:   Courtney Mack is a 66 y.o. female with known history of iron deficiency anemia, history of PE and DVT on Xarelto, hypertension, history of endometrial cancer, radiation cystitis, history of GAVE presented to ER last night secondary to chest pain and shortness of breath.  Patient had history of UTI and hematuria secondary to radiation cystitis in setting of anticoagulation, had a nephrostomy tube placed to the left side in 12/2021.  Patient is under [facility] palliative care and is currently full code.  Patient reported several episodes of black tarry stools yesterday which brought her to the ER.  Labs revealed hemoglobin 4.7, previously 7.8 on 12/31/2021, elevated BUN/creatinine 29/0.9.  She took Xarelto at 6 PM yesterday.  Patient received 3 units of PRBCs in the ER and reports feeling significantly better.  She denies any further episodes of melena since admission.  She has been hemodynamically stable and n.p.o., asking if she could eat something.  Patient denies any abdominal pain, nausea or vomiting.  Patient underwent CT angio protocol, did not reveal any active GI bleed, concern for gastric ulcer in the gastric antrum  Patient has history of chronic iron deficiency anemia, obscure GI bleed, had several admissions at [facility] secondary to acute on chronic iron deficiency anemia, initially started in December 2022, underwent upper endoscopy and colonoscopy.  Colonoscopy including TI evaluation was unremarkable except for colonic diverticulosis and  hemorrhoids.  She underwent upper endoscopy which revealed gastric antral backed circular ectasias, treated with APC.  Subsequently, patient had another admission with acute on chronic anemia, underwent small bowel endoscopy which was unrevealing.  Most recent admission in 10/2021 secondary to worsening anemia, underwent upper endoscopy, found to have gastric antral vascular ectasias, treated with APC  Patient has been closely followed by Dr. Tasia Catchings, heme-onc, has been receiving IV iron periodically  NSAIDs: None  Antiplts/Anticoagulants/Anti thrombotics: Xarelto for history of PE/DVT, last dose 7/25 6 PM  GI Procedures:  EGD 10/14/2021 - Esophageal plaques were found, suspicious for candidiasis. Cells for cytology obtained. - Gastric antral vascular ectasia without bleeding. Treated with argon plasma coagulation (APC). - Normal examined duodenum. - No sign of active bleeding YEAST WITH PSEUDOHYPHAE   Past Medical History:  Diagnosis Date   Acute bilateral deep vein thrombosis (DVT) of femoral veins ([place]) 01/02/2021   Acute massive pulmonary embolism ([place]) 08/25/2020   Last Assessment & Plan:  Formatting of this note might be different from the original. Post vena caval filter and on xarelto and O2   Anemia    ARF (acute respiratory failure) ([place])    Cancer ([place])    endometrial   Cellulitis of left lower leg 06/20/2021   CHF (congestive heart failure) ([place])    COVID-19    Diabetes mellitus without complication ([place])    DVT (deep venous thrombosis) ([place]) 09/06/2020   High cholesterol    Hx of blood clots    Hyperlipidemia    Hypertension    IDA (iron deficiency anemia) 09/21/2020  Obesity    Pressure injury of skin 06/21/2021   Pulmonary embolism ([place])    Sepsis ([place])    Thrombocytopenia ([place])    Urinary tract infection 09/13/2021    Past Surgical History:  Procedure Laterality Date   COLONOSCOPY N/A 06/21/2021   Procedure: COLONOSCOPY;  Surgeon: Toledo, Benay Pike, MD;   Location: [facility] ENDOSCOPY;  Service: Gastroenterology;  Laterality: N/A;   CYSTOSCOPY W/ URETERAL STENT PLACEMENT Left 09/11/2021   Procedure: CYSTOSCOPY WITH RETROGRADE PYELOGRAM/URETERAL STENT PLACEMENT;  Surgeon: Abbie Sons, MD;  Location: [facility] ORS;  Service: Urology;  Laterality: Left;   CYSTOSCOPY W/ URETERAL STENT PLACEMENT Left 11/17/2021   Procedure: CYSTOSCOPY WITH STENT REPLACEMENT;  Surgeon: Irine Seal, MD;  Location: [facility] ORS;  Service: Urology;  Laterality: Left;   CYSTOSCOPY WITH FULGERATION  11/17/2021   Procedure: CYSTOSCOPY WITH FULGERATION;  Surgeon: Irine Seal, MD;  Location: [facility] ORS;  Service: Urology;;   CYSTOSCOPY WITH STENT PLACEMENT Left 10/17/2021   Procedure: CYSTOSCOPY WITH STENT PLACEMENT;  Surgeon: Abbie Sons, MD;  Location: [facility] ORS;  Service: Urology;  Laterality: Left;   CYSTOSCOPY WITH URETEROSCOPY, STONE BASKETRY AND STENT PLACEMENT Left 10/16/2021   Procedure: CYSTOSCOPY WITH URETEROSCOPY  AND STENT REMOVAL;  Surgeon: Abbie Sons, MD;  Location: [facility] ORS;  Service: Urology;  Laterality: Left;   ENTEROSCOPY N/A 08/17/2021   Procedure: ENTEROSCOPY;  Surgeon: Lin Landsman, MD;  Location: [facility] ENDOSCOPY;  Service: Gastroenterology;  Laterality: N/A;   ESOPHAGOGASTRODUODENOSCOPY N/A 06/21/2021   Procedure: ESOPHAGOGASTRODUODENOSCOPY (EGD);  Surgeon: Toledo, Benay Pike, MD;  Location: [facility] ENDOSCOPY;  Service: Gastroenterology;  Laterality: N/A;   ESOPHAGOGASTRODUODENOSCOPY (EGD) WITH PROPOFOL N/A 08/17/2021   Procedure: ESOPHAGOGASTRODUODENOSCOPY (EGD) WITH PROPOFOL;  Surgeon: Lin Landsman, MD;  Location: [facility] ENDOSCOPY;  Service: Gastroenterology;  Laterality: N/A;   ESOPHAGOGASTRODUODENOSCOPY (EGD) WITH PROPOFOL N/A 10/14/2021   Procedure: ESOPHAGOGASTRODUODENOSCOPY (EGD) WITH PROPOFOL;  Surgeon: [name], MD;  Location: [facility] ENDOSCOPY;  Service: Endoscopy;  Laterality: N/A;   GIVENS CAPSULE STUDY N/A 06/22/2021   Procedure: GIVENS CAPSULE  STUDY;  Surgeon: Toledo, Benay Pike, MD;  Location: [facility] ENDOSCOPY;  Service: Gastroenterology;  Laterality: N/A;   IR NEPHROSTOMY PLACEMENT LEFT  12/27/2021   IVC FILTER INSERTION N/A 08/18/2020   Procedure: IVC FILTER INSERTION;  Surgeon: Katha Cabal, MD;  Location: [facility];  Service: Cardiovascular;  Laterality: N/A;   PERIPHERAL VASCULAR THROMBECTOMY Bilateral 01/03/2021   Procedure: PERIPHERAL VASCULAR THROMBECTOMY;  Surgeon: Algernon Huxley, MD;  Location: [facility];  Service: Cardiovascular;  Laterality: Bilateral;   PULMONARY THROMBECTOMY N/A 08/18/2020   Procedure: PULMONARY THROMBECTOMY;  Surgeon: Katha Cabal, MD;  Location: [facility];  Service: Cardiovascular;  Laterality: N/A;   TUBAL LIGATION     VISCERAL ANGIOGRAPHY N/A 07/18/2021   Procedure: VISCERAL ANGIOGRAPHY;  Surgeon: Algernon Huxley, MD;  Location: [facility];  Service: Cardiovascular;  Laterality: N/A;     Current Facility-Administered Medications:    acetaminophen (TYLENOL) tablet 650 mg, 650 mg, Oral, Q6H PRN **OR** acetaminophen (TYLENOL) suppository 650 mg, 650 mg, Rectal, Q6H PRN, Florina Ou V, MD   cefTRIAXone (ROCEPHIN) 1 g in sodium chloride 0.9 % 100 mL IVPB, 1 g, Intravenous, Q24H, Para Skeans, MD, Stopped at 01/30/22 0442   lactated ringers infusion, , Intravenous, Continuous, Para Skeans, MD, Last Rate: 75 mL/hr at 01/30/22 0114, New Bag at 01/30/22 0114   ondansetron (ZOFRAN) tablet 4 mg, 4 mg, Oral, Q6H PRN **OR**  ondansetron (ZOFRAN) injection 4 mg, 4 mg, Intravenous, Q6H PRN, Posey Pronto, Ekta V, MD   pantoprozole (PROTONIX) 80 mg /NS 100 mL infusion, 8 mg/hr, Intravenous, Continuous, Florina Ou V, MD, Last Rate: 10 mL/hr at 01/30/22 0144, 8 mg/hr at 01/30/22 0144  Current Outpatient Medications:    acetaminophen (TYLENOL) 500 MG tablet, Take 500 mg by mouth every 6 (six) hours as needed for mild pain or fever., Disp: , Rfl:    Ascorbic Acid (VITAMIN C) 1000  MG tablet, Take 1,000 mg by mouth daily., Disp: , Rfl:    BREO ELLIPTA 100-25 MCG/ACT AEPB, Inhale 1 puff into the lungs daily., Disp: , Rfl:    Ferrous Sulfate (IRON) 325 (65 Fe) MG TABS, Take 325 mg by mouth every other day., Disp: , Rfl:    furosemide (LASIX) 20 MG tablet, Hold due to low blood pressure., Disp: 30 tablet, Rfl: 11   loperamide (IMODIUM) 2 MG capsule, Take 1 capsule (2 mg total) by mouth as needed for diarrhea or loose stools., Disp: 30 capsule, Rfl: 0   mometasone-formoterol (DULERA) 100-5 MCG/ACT AERO, Inhale 2 puffs into the lungs 2 (two) times daily. (Patient not taking: Reported on 01/15/2022), Disp: , Rfl:    Multiple Vitamin (MULTIVITAMIN WITH MINERALS) TABS tablet, Take 1 tablet by mouth daily., Disp: , Rfl:    pantoprazole (PROTONIX) 40 MG tablet, Take 40 mg by mouth every morning., Disp: , Rfl:    rivaroxaban (XARELTO) 10 MG TABS tablet, Hold until followup with urology., Disp: 30 tablet, Rfl: 0   rosuvastatin (CRESTOR) 10 MG tablet, Take 1 tablet (10 mg total) by mouth daily. (Patient taking differently: Take 10 mg by mouth every morning.), Disp: 15 tablet, Rfl: 0   Family History  Problem Relation Age of Onset   Hypertension Mother    Diabetes Mother    Diabetes Father    Hypertension Father    High Cholesterol Father    Congestive Heart Failure Father    Breast cancer Cousin      Social History   Tobacco Use   Smoking status: Never   Smokeless tobacco: Never   Tobacco comments:    smoked 2-4 cigarettes for about 2-3 weeks   Vaping Use   Vaping Use: Never used  Substance Use Topics   Alcohol use: Not Currently   Drug use: Not Currently    Allergies as of 01/29/2022   (No Known Allergies)    Review of Systems:    All systems reviewed and negative except where noted in HPI.   Physical Exam:  Vital signs in last 24 hours: Temp:  [98.2 F (36.8 C)-98.5 F (36.9 C)] 98.3 F (36.8 C) (07/26 0800) Pulse Rate:  [73-96] 75 (07/26 0800) Resp:   [16-26] 16 (07/26 0800) BP: (93-111)/(37-60) 95/50 (07/26 0800) SpO2:  [100 %] 100 % (07/26 0600) Weight:  [81.6 kg] 81.6 kg (07/25 1925)   General:   Pleasant, cooperative in NAD Head:  Normocephalic and atraumatic. Eyes:   No icterus.   Conjunctiva pink. PERRLA. Ears:  Normal auditory acuity. Neck:  Supple; no masses or thyroidomegaly Lungs: Respirations even and unlabored. Lungs clear to auscultation bilaterally.   No wheezes, crackles, or rhonchi.  Heart:  Regular rate and rhythm;  Without murmur, clicks, rubs or gallops Abdomen:  Soft, nondistended, nontender. Normal bowel sounds. No appreciable masses or hepatomegaly.  No rebound or guarding.  Nephrostomy tube in place from the left kidney and contains cloudy urine, no blood Rectal:  Not performed. Msk:  Symmetrical without gross deformities.  Strength generalized weakness Extremities:  Without edema, cyanosis or clubbing. Neurologic:  Alert and oriented x3;  grossly normal neurologically. Skin:  Intact without significant lesions or rashes. Psych:  Alert and cooperative. Normal affect.  LAB RESULTS:    Latest Ref Rng & Units 01/29/2022    7:30 PM 12/31/2021    4:18 AM 12/30/2021    4:33 AM  CBC  WBC 4.0 - 10.5 K/uL 5.6  4.1  4.5   Hemoglobin 12.0 - 15.0 g/dL 4.7  7.8  8.1   Hematocrit 36.0 - 46.0 % 16.2  26.3  26.5   Platelets 150 - 400 K/uL 246  209  223     BMET    Latest Ref Rng & Units 01/29/2022    7:30 PM 12/31/2021    4:18 AM 12/30/2021    4:33 AM  BMP  Glucose 70 - 99 mg/dL 115  117  135   BUN 8 - 23 mg/dL 29  14  14    Creatinine 0.44 - 1.00 mg/dL 0.90  0.64  0.66   Sodium 135 - 145 mmol/L 136  139  137   Potassium 3.5 - 5.1 mmol/L 3.5  4.0  4.1   Chloride 98 - 111 mmol/L 104  109  110   CO2 22 - 32 mmol/L 24  26  24    Calcium 8.9 - 10.3 mg/dL 9.4  8.6  8.5     LFT    Latest Ref Rng & Units 12/24/2021   12:32 PM 11/26/2021    1:59 PM 11/17/2021    1:09 AM  Hepatic Function  Total Protein 6.5 - 8.1 g/dL  7.3  7.2  6.6   Albumin 3.5 - 5.0 g/dL 3.3  3.4  2.8   AST 15 - 41 U/L 16  22  21    ALT 0 - 44 U/L 8  12  10    Alk Phosphatase 38 - 126 U/L 45  44  44   Total Bilirubin 0.3 - 1.2 mg/dL 1.3  0.9  0.7      STUDIES: CT ANGIO GI BLEED  Result Date: 01/30/2022 CLINICAL DATA:  Presyncope, black stool per rectum, GI bleed EXAM: CTA ABDOMEN AND PELVIS WITHOUT AND WITH CONTRAST TECHNIQUE: Multidetector CT imaging of the abdomen and pelvis was performed using the standard protocol during bolus administration of intravenous contrast. Multiplanar reconstructed images and MIPs were obtained and reviewed to evaluate the vascular anatomy. RADIATION DOSE REDUCTION: This exam was performed according to the departmental dose-optimization program which includes automated exposure control, adjustment of the mA and/or kV according to patient size and/or use of iterative reconstruction technique. CONTRAST:  148m OMNIPAQUE IOHEXOL 350 MG/ML SOLN COMPARISON:  CT abdomen/pelvis dated 12/24/2021 FINDINGS: VASCULAR Aorta: No evidence abdominal aortic aneurysm or dissection. Patent. Atherosclerotic calcifications. Celiac: Patent.  Atherosclerotic calcifications at the origin. SMA: Patent.  Atherosclerotic calcifications of the origin. Renals: Patent. Atherosclerotic calcifications at the origin bilaterally. IMA: Patent. Inflow: Patent bilaterally.  Atherosclerotic calcifications. Proximal Outflow: Patent. Veins: IVC filter.  Otherwise unremarkable. No active GI bleeding. See below for additional pertinent findings in the stomach. Review of the MIP images confirms the above findings. NON-VASCULAR Lower chest: Lung bases are clear. Hepatobiliary: Liver is within normal limits. Gallbladder is unremarkable. No intrahepatic or extrahepatic ductal dilatation. Pancreas: Within normal limits. Spleen: Within normal limits. Adrenals/Urinary Tract: Adrenal glands are within normal limits. Left kidney is notable for a percutaneous  nephrostomy. Right kidney is within normal limits. No  hydronephrosis. Bladder is mildly thick-walled with perivesical stranding (series 7/image 35), although underdistended. Stomach/Bowel: Stomach is notable for mural hyperenhancement with submucosal edema along the gastric antrum (series 12/image 27), suspicious for a gastric ulcer. Slightly asymmetric/focal thickening along the superior wall of the gastric antrum at least raises the possibility of an underlying lesion in this location. No evidence of bowel obstruction. Normal appendix (series 6/image 152). No colonic wall thickening or inflammatory changes. Lymphatic: No suspicious abdominopelvic lymphadenopathy. Reproductive: Uterus is within normal limits. No adnexal masses. Other: No abdominopelvic ascites. Musculoskeletal: Degenerative changes of the visualized thoracolumbar spine. IMPRESSION: Suspected gastric ulcer in the gastric antrum. Underlying mass lesion is possible. Upper endoscopy is suggested. No evidence of active GI bleeding. Mildly thick-walled bladder with perivesical stranding, correlate for cystitis. Electronically Signed   By: Julian Hy M.D.   On: 01/30/2022 00:57   DG Chest Port 1 View  Result Date: 01/29/2022 CLINICAL DATA:  Chest pain and shortness of breath. EXAM: PORTABLE CHEST 1 VIEW COMPARISON:  None Available. FINDINGS: The right internal jugular venous catheter seen on the prior study has been removed. The heart size and mediastinal contours are within normal limits. Both lungs are clear. Multilevel degenerative changes are seen throughout the thoracic spine. IMPRESSION: No active cardiopulmonary disease. Electronically Signed   By: Virgina Norfolk M.D.   On: 01/29/2022 20:14      Impression / Plan:   Courtney Mack is a 66 y.o. female with history of chronic recurrent iron deficiency anemia, history of PE and DVT on Xarelto, hypertension, history of endometrial cancer, radiation cystitis, history of GAVE is  admitted with acute on chronic symptomatic severe iron deficiency anemia, melena in setting of anticoagulation  Melena, acute on chronic iron deficiency anemia S/p units of PRBCs Patient is currently hemodynamically stable with no further episodes of melena or hematochezia since admission Maintain 2 large-bore IVs Agree with IV Protonix Patient's last dose of Xarelto was on 7/25 6 PM, recommended to hold at least for 48 hours before proceeding with endoscopic evaluation given patient is hemodynamically stable.  Tentative plan for upper endoscopy on Friday, if EGD is unremarkable, recommend colonoscopy and possible video capsule endoscopy Okay to start full liquids today and advance to soft diet tomorrow Monitor CBC closely, maintain hemoglobin above 7 Recommend IV iron, oral B12 1000 mcg daily   Thank you for involving me in the care of this patient.      LOS: 1 day   Sherri Sear, MD  01/30/2022, 8:03 AM    Note: This dictation was prepared with Dragon dictation along with smaller phrase technology. Any transcriptional errors that result from this process are unintentional.

## 2022-01-30 NOTE — Assessment & Plan Note (Signed)
Attribute to orthostatic changes from acute anemia.  Fall precaution.  Strict bedrest.

## 2022-01-30 NOTE — Assessment & Plan Note (Addendum)
Generalized abdominal pain with upper abd epigastric and left sided area. PRN iv morphine as needed.  Await CTA to identity problem or active GIB or infection.  With multiple episodes of diarrhea that is bark in color ,. Will get stool cultures follow.

## 2022-01-30 NOTE — Assessment & Plan Note (Addendum)
Recently completed treatment with linezolid. Urinalysis shows in WBC> 50 and we will start rocephin.

## 2022-01-30 NOTE — Progress Notes (Signed)
[facility] [place] NAME: Courtney Mack    MR#:  [id number]  DATE OF BIRTH:  1955/07/23  SUBJECTIVE:  patient came in with dark black diarrheal stools and generalized weakness. She was found to have hemoglobin of 4.7. She is three unit blood transfusion. Hemoglobin is 8.0. No family at bedside. Seen in the ER earlier denies abdominal pain    VITALS:  Blood pressure (!) 108/53, pulse 69, temperature 98 F (36.7 C), resp. rate 17, height '4\' 11"'$  (1.499 m), weight 81.6 kg, SpO2 100 %.  PHYSICAL EXAMINATION:   GENERAL:  66 y.o.-year-old patient lying in the bed with no acute distress.  LUNGS: Normal breath sounds bilaterally, no wheezing, rales, rhonchi.  CARDIOVASCULAR: S1, S2 normal. No murmurs, rubs, or gallops.  ABDOMEN: Soft, nontender, nondistended. Bowel sounds present.  EXTREMITIES: No  edema b/l.    NEUROLOGIC: nonfocal  patient is alert and awake, weak SKIN: No obvious rash, lesion, or ulcer.   LABORATORY PANEL:  CBC Recent Labs  Lab 01/30/22 1031  WBC 3.9*  HGB 8.0*  HCT 25.4*  PLT 198    Chemistries  Recent Labs  Lab 01/29/22 1930  NA 136  K 3.5  CL 104  CO2 24  GLUCOSE 115*  BUN 29*  CREATININE 0.90  CALCIUM 9.4   Cardiac Enzymes No results for input(s): "TROPONINI" in the last 168 hours. RADIOLOGY:  CT ANGIO GI BLEED  Result Date: 01/30/2022 CLINICAL DATA:  Presyncope, black stool per rectum, GI bleed EXAM: CTA ABDOMEN AND PELVIS WITHOUT AND WITH CONTRAST TECHNIQUE: Multidetector CT imaging of the abdomen and pelvis was performed using the standard protocol during bolus administration of intravenous contrast. Multiplanar reconstructed images and MIPs were obtained and reviewed to evaluate the vascular anatomy. RADIATION DOSE REDUCTION: This exam was performed according to the departmental dose-optimization program which includes automated exposure control, adjustment of the mA and/or kV according to patient  size and/or use of iterative reconstruction technique. CONTRAST:  137m OMNIPAQUE IOHEXOL 350 MG/ML SOLN COMPARISON:  CT abdomen/pelvis dated 12/24/2021 FINDINGS: VASCULAR Aorta: No evidence abdominal aortic aneurysm or dissection. Patent. Atherosclerotic calcifications. Celiac: Patent.  Atherosclerotic calcifications at the origin. SMA: Patent.  Atherosclerotic calcifications of the origin. Renals: Patent. Atherosclerotic calcifications at the origin bilaterally. IMA: Patent. Inflow: Patent bilaterally.  Atherosclerotic calcifications. Proximal Outflow: Patent. Veins: IVC filter.  Otherwise unremarkable. No active GI bleeding. See below for additional pertinent findings in the stomach. Review of the MIP images confirms the above findings. NON-VASCULAR Lower chest: Lung bases are clear. Hepatobiliary: Liver is within normal limits. Gallbladder is unremarkable. No intrahepatic or extrahepatic ductal dilatation. Pancreas: Within normal limits. Spleen: Within normal limits. Adrenals/Urinary Tract: Adrenal glands are within normal limits. Left kidney is notable for a percutaneous nephrostomy. Right kidney is within normal limits. No hydronephrosis. Bladder is mildly thick-walled with perivesical stranding (series 7/image 35), although underdistended. Stomach/Bowel: Stomach is notable for mural hyperenhancement with submucosal edema along the gastric antrum (series 12/image 27), suspicious for a gastric ulcer. Slightly asymmetric/focal thickening along the superior wall of the gastric antrum at least raises the possibility of an underlying lesion in this location. No evidence of bowel obstruction. Normal appendix (series 6/image 152). No colonic wall thickening or inflammatory changes. Lymphatic: No suspicious abdominopelvic lymphadenopathy. Reproductive: Uterus is within normal limits. No adnexal masses. Other: No abdominopelvic ascites. Musculoskeletal: Degenerative changes of the visualized thoracolumbar spine.  IMPRESSION: Suspected gastric ulcer in the gastric antrum. Underlying mass lesion is  possible. Upper endoscopy is suggested. No evidence of active GI bleeding. Mildly thick-walled bladder with perivesical stranding, correlate for cystitis. Electronically Signed   By: Julian Hy M.D.   On: 01/30/2022 00:57   DG Chest Port 1 View  Result Date: 01/29/2022 CLINICAL DATA:  Chest pain and shortness of breath. EXAM: PORTABLE CHEST 1 VIEW COMPARISON:  None Available. FINDINGS: The right internal jugular venous catheter seen on the prior study has been removed. The heart size and mediastinal contours are within normal limits. Both lungs are clear. Multilevel degenerative changes are seen throughout the thoracic spine. IMPRESSION: No active cardiopulmonary disease. Electronically Signed   By: Virgina Norfolk M.D.   On: 01/29/2022 20:14    Assessment and Plan Courtney Mack is a 66 y.o. female with known history of iron deficiency anemia, history of PE and DVT on Xarelto, hypertension, history of endometrial cancer, radiation cystitis, history of GAVE presented to ER last night secondary to chest pain and shortness of breath.  Patient had history of UTI and hematuria secondary to radiation cystitis in setting of anticoagulation, had a nephrostomy tube placed to the left side in 12/2021.  Patient is under [place] palliative care and is currently full code.  Patient reported several episodes of black tarry stools yesterday which brought her to the ER.  Labs revealed hemoglobin 4.7, previously 7.8 on 12/31/2021.  Acute anemia due to G.I. blood loss, symptomatic history of GAVE history of chronic anticoagulation -- patient came in with hemoglobin of 4.7--- received three unit of blood transfusion-- 8.0 -- CT abdomen shows gastric ulcer versus questionable mass -- G.I. consultation with Dr. Marius Ditch plans for endoscopy Friday since patient took her Xarelto yesterday -- full liquid diet -- monitor H&H and  transfuse as needed -- CTA negative for active bleed  history of UTI H/o left nephrostomy tube placement -- recently completed treatment with linezolid -- patient denies any symptoms of dysuria and urinalysis not evident with UTI -- DC antibiotics  history of chronic diastolic congestive heart failure -- continue home meds. -- Well compensated  history of PE and recurrent DVT IVC filter placement--Per chart review patient had Denali IVC filter insertion on 08/18/2020 -- patient on Xarelto-- currently on hold  Dyslipidemia -- continue statins    Procedures: Family communication : none Consults : G.I. Dr. Marius Ditch CODE STATUS: full DVT Prophylaxis : SCD Level of care: Telemetry Medical Status is: Inpatient Remains inpatient appropriate because: G.I. bleed. Endoscopy plan for Friday    TOTAL TIME TAKING CARE OF THIS PATIENT: 35 minutes.  >50% time spent on counselling and coordination of care  Note: This dictation was prepared with Dragon dictation along with smaller phrase technology. Any transcriptional errors that result from this process are unintentional.  Fritzi Mandes M.D    [facility]   CC: Primary care physician; [facility], [place]

## 2022-01-30 NOTE — Assessment & Plan Note (Signed)
Suspect GAVE/ diverticular bleed/ infection. GI consulted but will follow CTA and seek gen surg or Interventional radiology as needed.

## 2022-01-31 DIAGNOSIS — N1330 Unspecified hydronephrosis: Secondary | ICD-10-CM | POA: Diagnosis not present

## 2022-01-31 DIAGNOSIS — I5032 Chronic diastolic (congestive) heart failure: Secondary | ICD-10-CM | POA: Diagnosis not present

## 2022-01-31 DIAGNOSIS — D62 Acute posthemorrhagic anemia: Secondary | ICD-10-CM | POA: Diagnosis not present

## 2022-01-31 DIAGNOSIS — K31819 Angiodysplasia of stomach and duodenum without bleeding: Secondary | ICD-10-CM | POA: Diagnosis not present

## 2022-01-31 LAB — VITAMIN B12: Vitamin B-12: 320 pg/mL (ref 180–914)

## 2022-01-31 MED ORDER — ADULT MULTIVITAMIN W/MINERALS CH
1.0000 | ORAL_TABLET | Freq: Every day | ORAL | Status: DC
  Administered 2022-01-31: 1 via ORAL
  Filled 2022-01-31: qty 1

## 2022-01-31 MED ORDER — IRON SUCROSE 20 MG/ML IV SOLN
300.0000 mg | Freq: Once | INTRAVENOUS | Status: AC
  Administered 2022-01-31: 300 mg via INTRAVENOUS
  Filled 2022-01-31: qty 300

## 2022-01-31 MED ORDER — ENSURE ENLIVE PO LIQD
237.0000 mL | Freq: Three times a day (TID) | ORAL | Status: DC
  Administered 2022-01-31 – 2022-02-02 (×5): 237 mL via ORAL

## 2022-01-31 NOTE — Progress Notes (Signed)
[facility] [place] NAME: Courtney Mack    MR#:  [id number]  DATE OF BIRTH:  1955-08-24  SUBJECTIVE:  patient came in with dark black diarrheal stools and generalized weakness. She was found to have hemoglobin of 4.7. She is three unit blood transfusion. Hemoglobin is 8.0. No family at bedside.   No BM since last Monday (per pt)   VITALS:  Blood pressure (!) 103/48, pulse 67, temperature 97.9 F (36.6 C), resp. rate 18, height '4\' 11"'$  (1.499 m), weight 81.6 kg, SpO2 99 %.  PHYSICAL EXAMINATION:   GENERAL:  66 y.o.-year-old patient lying in the bed with no acute distress. Pallor+ LUNGS: Normal breath sounds bilaterally, no wheezing CARDIOVASCULAR: S1, S2 normal. No murmurs ABDOMEN: Soft, nontender, nondistended. Bowel sounds present.  EXTREMITIES: No  edema b/l.    NEUROLOGIC: nonfocal  patient is alert and awake, weak SKIN: No obvious rash, lesion, or ulcer.   LABORATORY PANEL:  CBC Recent Labs  Lab 01/30/22 1031  WBC 3.9*  HGB 8.0*  HCT 25.4*  PLT 198     Chemistries  Recent Labs  Lab 01/29/22 1930  NA 136  K 3.5  CL 104  CO2 24  GLUCOSE 115*  BUN 29*  CREATININE 0.90  CALCIUM 9.4    Cardiac Enzymes No results for input(s): "TROPONINI" in the last 168 hours. RADIOLOGY:  CT ANGIO GI BLEED  Result Date: 01/30/2022 CLINICAL DATA:  Presyncope, black stool per rectum, GI bleed EXAM: CTA ABDOMEN AND PELVIS WITHOUT AND WITH CONTRAST TECHNIQUE: Multidetector CT imaging of the abdomen and pelvis was performed using the standard protocol during bolus administration of intravenous contrast. Multiplanar reconstructed images and MIPs were obtained and reviewed to evaluate the vascular anatomy. RADIATION DOSE REDUCTION: This exam was performed according to the departmental dose-optimization program which includes automated exposure control, adjustment of the mA and/or kV according to patient size and/or use of iterative  reconstruction technique. CONTRAST:  180m OMNIPAQUE IOHEXOL 350 MG/ML SOLN COMPARISON:  CT abdomen/pelvis dated 12/24/2021 FINDINGS: VASCULAR Aorta: No evidence abdominal aortic aneurysm or dissection. Patent. Atherosclerotic calcifications. Celiac: Patent.  Atherosclerotic calcifications at the origin. SMA: Patent.  Atherosclerotic calcifications of the origin. Renals: Patent. Atherosclerotic calcifications at the origin bilaterally. IMA: Patent. Inflow: Patent bilaterally.  Atherosclerotic calcifications. Proximal Outflow: Patent. Veins: IVC filter.  Otherwise unremarkable. No active GI bleeding. See below for additional pertinent findings in the stomach. Review of the MIP images confirms the above findings. NON-VASCULAR Lower chest: Lung bases are clear. Hepatobiliary: Liver is within normal limits. Gallbladder is unremarkable. No intrahepatic or extrahepatic ductal dilatation. Pancreas: Within normal limits. Spleen: Within normal limits. Adrenals/Urinary Tract: Adrenal glands are within normal limits. Left kidney is notable for a percutaneous nephrostomy. Right kidney is within normal limits. No hydronephrosis. Bladder is mildly thick-walled with perivesical stranding (series 7/image 35), although underdistended. Stomach/Bowel: Stomach is notable for mural hyperenhancement with submucosal edema along the gastric antrum (series 12/image 27), suspicious for a gastric ulcer. Slightly asymmetric/focal thickening along the superior wall of the gastric antrum at least raises the possibility of an underlying lesion in this location. No evidence of bowel obstruction. Normal appendix (series 6/image 152). No colonic wall thickening or inflammatory changes. Lymphatic: No suspicious abdominopelvic lymphadenopathy. Reproductive: Uterus is within normal limits. No adnexal masses. Other: No abdominopelvic ascites. Musculoskeletal: Degenerative changes of the visualized thoracolumbar spine. IMPRESSION: Suspected gastric ulcer  in the gastric antrum. Underlying mass lesion is possible. Upper endoscopy is suggested.  No evidence of active GI bleeding. Mildly thick-walled bladder with perivesical stranding, correlate for cystitis. Electronically Signed   By: Julian Hy M.D.   On: 01/30/2022 00:57   DG Chest Port 1 View  Result Date: 01/29/2022 CLINICAL DATA:  Chest pain and shortness of breath. EXAM: PORTABLE CHEST 1 VIEW COMPARISON:  None Available. FINDINGS: The right internal jugular venous catheter seen on the prior study has been removed. The heart size and mediastinal contours are within normal limits. Both lungs are clear. Multilevel degenerative changes are seen throughout the thoracic spine. IMPRESSION: No active cardiopulmonary disease. Electronically Signed   By: Virgina Norfolk M.D.   On: 01/29/2022 20:14    Assessment and Plan Courtney Mack is a 66 y.o. female with known history of iron deficiency anemia, history of PE and DVT on Xarelto, hypertension, history of endometrial cancer, radiation cystitis, history of GAVE presented to ER last night secondary to chest pain and shortness of breath.  Patient had history of UTI and hematuria secondary to radiation cystitis in setting of anticoagulation, had a nephrostomy tube placed to the left side in 12/2021.  Patient is under [place] palliative care and is currently full code.  Patient reported several episodes of black tarry stools yesterday which brought her to the ER.  Labs revealed hemoglobin 4.7, previously 7.8 on 12/31/2021.  Acute anemia due to G.I. blood loss, symptomatic history of GAVE history of chronic anticoagulation -- patient came in with hemoglobin of 4.7--- received three unit of blood transfusion-- 8.0 -- CT abdomen shows gastric ulcer versus questionable mass -- G.I. consultation with Dr. Marius Ditch plans for endoscopy Friday since patient took her Xarelto (Tuesday) -- full liquid diet -- monitor H&H and transfuse as needed -- CTA negative  for active bleed  history of UTI H/o left nephrostomy tube placement -- recently completed treatment with linezolid -- patient denies any symptoms of dysuria and urinalysis not evident with UTI -- DC antibiotics  history of chronic diastolic congestive heart failure -- continue home meds. -- Well compensated  history of PE and recurrent DVT IVC filter placement--Per chart review patient had Denali IVC filter insertion on 08/18/2020 -- patient on Xarelto-- currently on hold  Dyslipidemia -- continue statins    Procedures: Family communication : none Consults : G.I. Dr. Marius Ditch CODE STATUS: full DVT Prophylaxis : SCD Level of care: Telemetry Medical Status is: Inpatient Remains inpatient appropriate because: G.I. bleed. Endoscopy plan for Friday    TOTAL TIME TAKING CARE OF THIS PATIENT: 35 minutes.  >50% time spent on counselling and coordination of care  Note: This dictation was prepared with Dragon dictation along with smaller phrase technology. Any transcriptional errors that result from this process are unintentional.  Fritzi Mandes M.D    [facility]   CC: Primary care physician; [facility], [place]

## 2022-01-31 NOTE — Progress Notes (Signed)
IVT consulted for difficult assessment/stick.  Pt was on non-compatible Rx's 7/27, however, the Rx order is complete.  Current meds are compatible.  RN made aware and states there is a procedure in the morning, but pt continues to lose PIV sites.  Advised to place consult in the morning, 7/28 to have best chance at having a usable PIV site closer to procedure time.  RN, Gerald Stabs acknowledges understanding.

## 2022-01-31 NOTE — Evaluation (Signed)
Physical Therapy Evaluation Patient Details Name: Courtney Mack MRN: [id number] DOB: [birth year] Today's Date: 01/31/2022  History of Present Illness  Courtney Mack is a 66 y.o. female with known history of iron deficiency anemia, history of PE and DVT on Xarelto, hypertension, history of endometrial cancer, radiation cystitis, history of GAVE presented to ER last night secondary to chest pain and shortness of breath.  Patient had history of UTI and hematuria secondary to radiation cystitis in setting of anticoagulation, had a nephrostomy tube placed to the left side in 12/2021.  Patient is under [facility] palliative care and is currently full code.  Patient reported several episodes of black tarry stools yesterday which brought her to the ER.  Labs revealed hemoglobin 4.7, previously 7.8 on 12/31/2021.   Clinical Impression  Pt admitted with above diagnosis. Pt received upright in recliner agreeable to PT. Reports she remains mod-I with rollator in household, has PRN assist from daughter and grand kids. Pt able to stand to RW with supervision and maintain standing for 5 min at RW minguard to supervision for bandage change on pt's side. Pt then ambulating to <> from bathroom back to bed. Pt in rest room passing continent BM, minA to stand from low toilet and dependent for RN for perihygiene. Pt ambulating back to Eob supervision. Min VC's for keeping RW closer to BOS. Min to [facility] for LE management with all needs in reach. Pt demonstrating adequate Le strength and use of AD to safely manage at home with family support. Pt currently with functional limitations due to the deficits listed below (see PT Problem List). Pt will benefit from skilled PT to increase their independence and safety with mobility to allow discharge to the venue listed below.          Recommendations for follow up therapy are one component of a multi-disciplinary discharge planning process, led by the attending physician.   Recommendations may be updated based on patient status, additional functional criteria and insurance authorization.  Follow Up Recommendations Home health PT      Assistance Recommended at Discharge Frequent or constant Supervision/Assistance  Patient can return home with the following  A little help with walking and/or transfers;A little help with bathing/dressing/bathroom;Assist for transportation;Help with stairs or ramp for entrance    Equipment Recommendations None recommended by PT  Recommendations for Other Services       Functional Status Assessment Patient has had a recent decline in their functional status and demonstrates the ability to make significant improvements in function in a reasonable and predictable amount of time.     Precautions / Restrictions Precautions Precautions: Fall Restrictions Weight Bearing Restrictions: No      Mobility  Bed Mobility Overal bed mobility: Needs Assistance Bed Mobility: Supine to Sit     Supine to sit: Mod assist     General bed mobility comments: LE management Patient Response: Cooperative  Transfers Overall transfer level: Needs assistance Equipment used: Rolling walker (2 wheels) Transfers: Sit to/from Stand Sit to Stand: Min guard                Ambulation/Gait Ambulation/Gait assistance: Supervision Gait Distance (Feet): 40 Feet Assistive device: Rolling walker (2 wheels) Gait Pattern/deviations: Step-through pattern, Decreased step length - right, Decreased step length - left       General Gait Details: Safe use of RW ambulating at slow but consistent cadence. RW slightly outside BOS anteriorly.  Stairs  Wheelchair Mobility    Modified Rankin (Stroke Patients Only)       Balance Overall balance assessment: Needs assistance Sitting-balance support: No upper extremity supported, Feet supported Sitting balance-Leahy Scale: Good     Standing balance support: Bilateral upper  extremity supported, During functional activity Standing balance-Leahy Scale: Fair Standing balance comment: use of RW                             Pertinent Vitals/Pain Pain Assessment Pain Assessment: No/denies pain    Home Living Family/patient expects to be discharged to:: Private residence Living Arrangements: Children Available Help at Discharge: Family;Available 24 hours/day Type of Home: House Home Access: Ramped entrance       Home Layout: One level Home Equipment: Rollator (4 wheels);BSC/3in1 (lift recliner) Additional Comments: sleeps in recliner    Prior Function Prior Level of Function : Independent/Modified Independent             Mobility Comments: amb household distances with rollator ADLs Comments: pt daugther assists with LB dressing, bathing; Pt able to perform UB dressing, grooming with MOD I     Hand Dominance   Dominant Hand: Right    Extremity/Trunk Assessment        Lower Extremity Assessment Lower Extremity Assessment: Generalized weakness    Cervical / Trunk Assessment Cervical / Trunk Assessment: Normal  Communication   Communication: No difficulties  Cognition Arousal/Alertness: Awake/alert Behavior During Therapy: WFL for tasks assessed/performed Overall Cognitive Status: Within Functional Limits for tasks assessed                                          General Comments      Exercises Other Exercises Other Exercises: Role of PT in acute setting, d/c recs   Assessment/Plan    PT Assessment Patient needs continued PT services  PT Problem List Decreased strength;Decreased mobility;Decreased activity tolerance       PT Treatment Interventions DME instruction;Therapeutic exercise;Gait training;Balance training;Functional mobility training;Therapeutic activities;Patient/family education    PT Goals (Current goals can be found in the Care Plan section)  Acute Rehab PT Goals Patient Stated  Goal: to get home safely PT Goal Formulation: With patient Time For Goal Achievement: 02/14/22 Potential to Achieve Goals: Good    Frequency Min 2X/week     Co-evaluation               AM-PAC PT "6 Clicks" Mobility  Outcome Measure Help needed turning from your back to your side while in a flat bed without using bedrails?: A Little Help needed moving from lying on your back to sitting on the side of a flat bed without using bedrails?: A Little Help needed moving to and from a bed to a chair (including a wheelchair)?: A Little Help needed standing up from a chair using your arms (e.g., wheelchair or bedside chair)?: A Little Help needed to walk in hospital room?: A Little Help needed climbing 3-5 steps with a railing? : A Lot 6 Click Score: 17    End of Session Equipment Utilized During Treatment: Gait belt Activity Tolerance: Patient tolerated treatment well Patient left: in bed;with call bell/phone within reach;with bed alarm set Nurse Communication: Mobility status PT Visit Diagnosis: Other abnormalities of gait and mobility (R26.89);Muscle weakness (generalized) (M62.81)    Time: 1240-1313 PT Time Calculation (min) (ACUTE ONLY):  33 min   Charges:   PT Evaluation $PT Eval Moderate Complexity: 1 Mod PT Treatments $Therapeutic Activity: 8-22 mins       Skila Rollins M. Fairly IV, PT, DPT Physical Therapist- [facility]  01/31/2022, 2:26 PM

## 2022-01-31 NOTE — Progress Notes (Signed)
Cephas Darby, MD [address]  [place]  [place], [place] [address]  Main: [phone]  Fax: [phone] Pager: [phone]   Subjective: No acute events overnight, patient sitting up in chair and oxygenating well on room air.  She did not have any bowel movements since admission.   Objective: Vital signs in last 24 hours: Vitals:   01/31/22 0005 01/31/22 0441 01/31/22 1616 01/31/22 1958  BP: (!) 103/55 (!) 103/48 (!) 127/57 118/60  Pulse: 75 67 71 78  Resp: '16 18 16 20  '$ Temp: 98.5 F (36.9 C) 97.9 F (36.6 C) 98.3 F (36.8 C) 97.8 F (36.6 C)  TempSrc:      SpO2: 99% 99% 100% 98%  Weight:      Height:       Weight change:   Intake/Output Summary (Last 24 hours) at 01/31/2022 2235 Last data filed at 01/31/2022 1646 Gross per 24 hour  Intake 1159.44 ml  Output 1400 ml  Net -240.56 ml    Exam: Heart:: Regular rate and rhythm, S1S2 present, or without murmur or extra heart sounds Lungs: normal and clear to auscultation Abdomen: soft, nontender, normal bowel sounds   Lab Results:     Latest Ref Rng & Units 01/30/2022   10:31 AM 01/29/2022    7:30 PM 12/31/2021    4:18 AM  CBC  WBC 4.0 - 10.5 K/uL 3.9  5.6  4.1   Hemoglobin 12.0 - 15.0 g/dL 8.0  4.7  7.8   Hematocrit 36.0 - 46.0 % 25.4  16.2  26.3   Platelets 150 - 400 K/uL 198  246  209       Latest Ref Rng & Units 01/29/2022    7:30 PM 12/31/2021    4:18 AM 12/30/2021    4:33 AM  CMP  Glucose 70 - 99 mg/dL 115  117  135   BUN 8 - 23 mg/dL '29  14  14   '$ Creatinine 0.44 - 1.00 mg/dL 0.90  0.64  0.66   Sodium 135 - 145 mmol/L 136  139  137   Potassium 3.5 - 5.1 mmol/L 3.5  4.0  4.1   Chloride 98 - 111 mmol/L 104  109  110   CO2 22 - 32 mmol/L '24  26  24   '$ Calcium 8.9 - 10.3 mg/dL 9.4  8.6  8.5      Current Facility-Administered Medications:    acetaminophen (TYLENOL) tablet 650 mg, 650 mg, Oral, Q6H PRN **OR** acetaminophen (TYLENOL) suppository 650 mg, 650 mg, Rectal, Q6H PRN, Para Skeans, MD   feeding supplement (ENSURE ENLIVE / ENSURE PLUS) liquid 237 mL, 237 mL, Oral, TID BM, Fritzi Mandes, MD, 237 mL at 01/31/22 2034   multivitamin with minerals tablet 1 tablet, 1 tablet, Oral, Daily, Fritzi Mandes, MD, 1 tablet at 01/31/22 1240   mupirocin ointment (BACTROBAN) 2 % 1 Application, 1 Application, Nasal, BID, Fritzi Mandes, MD, 1 Application at 27/78/24 2032   ondansetron (ZOFRAN) tablet 4 mg, 4 mg, Oral, Q6H PRN **OR** ondansetron (ZOFRAN) injection 4 mg, 4 mg, Intravenous, Q6H PRN, Posey Pronto, Ekta V, MD   pantoprozole (PROTONIX) 80 mg /NS 100 mL infusion, 8 mg/hr, Intravenous, Continuous, Florina Ou V, MD, Last Rate: 10 mL/hr at 01/31/22 1922, 8 mg/hr at 01/31/22 1922   vitamin B-12 (CYANOCOBALAMIN) tablet 1,000 mcg, 1,000 mcg, Oral, Daily, Aryona Sill, Tally Due, MD, 1,000 mcg at 01/31/22 2353  Micro Results: Recent Results (from the past 240 hour(s))  MRSA  Next Gen by PCR, Nasal     Status: Abnormal   Collection Time: 01/30/22  6:07 PM   Specimen: Nasal Mucosa; Nasal Swab  Result Value Ref Range Status   MRSA by PCR Next Gen DETECTED (A) NOT DETECTED Final    Comment: RESULT CALLED TO, READ BACK BY AND VERIFIED WITH: ERIN LOUISSAINT AT 2045 ON 01/30/22 BY SS (NOTE) The GeneXpert MRSA Assay (FDA approved for NASAL specimens only), is one component of a comprehensive MRSA colonization surveillance program. It is not intended to diagnose MRSA infection nor to guide or monitor treatment for MRSA infections. Test performance is not FDA approved in patients less than 11 years old. Performed at [facility], [address] [place], [place] [address]    Studies/Results: CT ANGIO GI BLEED  Result Date: 01/30/2022 CLINICAL DATA:  Presyncope, black stool per rectum, GI bleed EXAM: CTA ABDOMEN AND PELVIS WITHOUT AND WITH CONTRAST TECHNIQUE: Multidetector CT imaging of the abdomen and pelvis was performed using the standard protocol during bolus administration of intravenous  contrast. Multiplanar reconstructed images and MIPs were obtained and reviewed to evaluate the vascular anatomy. RADIATION DOSE REDUCTION: This exam was performed according to the departmental dose-optimization program which includes automated exposure control, adjustment of the mA and/or kV according to patient size and/or use of iterative reconstruction technique. CONTRAST:  163m OMNIPAQUE IOHEXOL 350 MG/ML SOLN COMPARISON:  CT abdomen/pelvis dated 12/24/2021 FINDINGS: VASCULAR Aorta: No evidence abdominal aortic aneurysm or dissection. Patent. Atherosclerotic calcifications. Celiac: Patent.  Atherosclerotic calcifications at the origin. SMA: Patent.  Atherosclerotic calcifications of the origin. Renals: Patent. Atherosclerotic calcifications at the origin bilaterally. IMA: Patent. Inflow: Patent bilaterally.  Atherosclerotic calcifications. Proximal Outflow: Patent. Veins: IVC filter.  Otherwise unremarkable. No active GI bleeding. See below for additional pertinent findings in the stomach. Review of the MIP images confirms the above findings. NON-VASCULAR Lower chest: Lung bases are clear. Hepatobiliary: Liver is within normal limits. Gallbladder is unremarkable. No intrahepatic or extrahepatic ductal dilatation. Pancreas: Within normal limits. Spleen: Within normal limits. Adrenals/Urinary Tract: Adrenal glands are within normal limits. Left kidney is notable for a percutaneous nephrostomy. Right kidney is within normal limits. No hydronephrosis. Bladder is mildly thick-walled with perivesical stranding (series 7/image 35), although underdistended. Stomach/Bowel: Stomach is notable for mural hyperenhancement with submucosal edema along the gastric antrum (series 12/image 27), suspicious for a gastric ulcer. Slightly asymmetric/focal thickening along the superior wall of the gastric antrum at least raises the possibility of an underlying lesion in this location. No evidence of bowel obstruction. Normal appendix  (series 6/image 152). No colonic wall thickening or inflammatory changes. Lymphatic: No suspicious abdominopelvic lymphadenopathy. Reproductive: Uterus is within normal limits. No adnexal masses. Other: No abdominopelvic ascites. Musculoskeletal: Degenerative changes of the visualized thoracolumbar spine. IMPRESSION: Suspected gastric ulcer in the gastric antrum. Underlying mass lesion is possible. Upper endoscopy is suggested. No evidence of active GI bleeding. Mildly thick-walled bladder with perivesical stranding, correlate for cystitis. Electronically Signed   By: SJulian HyM.D.   On: 01/30/2022 00:57   Medications: I have reviewed the patient's current medications. Prior to Admission:  Medications Prior to Admission  Medication Sig Dispense Refill Last Dose   acetaminophen (TYLENOL) 500 MG tablet Take 500 mg by mouth every 6 (six) hours as needed for mild pain or fever.   01/29/2022   Ascorbic Acid (VITAMIN C) 1000 MG tablet Take 1,000 mg by mouth daily.   01/29/2022   BREO ELLIPTA 100-25 MCG/ACT AEPB Inhale 1 puff into  the lungs daily.   01/29/2022   furosemide (LASIX) 20 MG tablet Hold due to low blood pressure. (Patient taking differently: Take 20 mg by mouth daily. Hold due to low blood pressure.) 30 tablet 11 01/29/2022   loperamide (IMODIUM) 2 MG capsule Take 1 capsule (2 mg total) by mouth as needed for diarrhea or loose stools. 30 capsule 0 Past Week   Multiple Vitamin (MULTIVITAMIN WITH MINERALS) TABS tablet Take 1 tablet by mouth daily.   01/29/2022   pantoprazole (PROTONIX) 40 MG tablet Take 40 mg by mouth every morning.   01/29/2022   rivaroxaban (XARELTO) 10 MG TABS tablet Hold until followup with urology. 30 tablet 0 01/29/2022 at 1800   rosuvastatin (CRESTOR) 10 MG tablet Take 1 tablet (10 mg total) by mouth daily. (Patient taking differently: Take 10 mg by mouth every morning.) 15 tablet 0 01/29/2022   Ferrous Sulfate (IRON) 325 (65 Fe) MG TABS Take 325 mg by mouth every other day.  (Patient not taking: Reported on 01/30/2022)   Not Taking   mometasone-formoterol (DULERA) 100-5 MCG/ACT AERO Inhale 2 puffs into the lungs 2 (two) times daily. (Patient not taking: Reported on 01/15/2022)   Not Taking   Scheduled:  feeding supplement  237 mL Oral TID BM   multivitamin with minerals  1 tablet Oral Daily   mupirocin ointment  1 Application Nasal BID   vitamin B-12  1,000 mcg Oral Daily   Continuous:  pantoprazole 8 mg/hr (01/31/22 1922)   ATF:[id number] **OR** acetaminophen, ondansetron **OR** ondansetron (ZOFRAN) IV Anti-infectives (From admission, onward)    Start     Dose/Rate Route Frequency Ordered Stop   01/30/22 0130  cefTRIAXone (ROCEPHIN) 1 g in sodium chloride 0.9 % 100 mL IVPB  Status:  Discontinued        1 g 200 mL/hr over 30 Minutes Intravenous Every 24 hours 01/30/22 0120 01/30/22 1343      Scheduled Meds:  feeding supplement  237 mL Oral TID BM   multivitamin with minerals  1 tablet Oral Daily   mupirocin ointment  1 Application Nasal BID   vitamin B-12  1,000 mcg Oral Daily   Continuous Infusions:  pantoprazole 8 mg/hr (01/31/22 1922)   PRN Meds:.acetaminophen **OR** acetaminophen, ondansetron **OR** ondansetron (ZOFRAN) IV   Assessment: Principal Problem:   Dizziness on standing Active Problems:   Diabetes mellitus without complication (HCC)   Sepsis (HCC)   Chronic diastolic CHF (congestive heart failure) (HCC)   UTI (urinary tract infection)   Anemia due to GI blood loss, symptomatic with fatigue/lightheadedness and shortness of breath   Gastric antral vascular ectasia   Hydronephrosis, left   Abdominal pain   GI bleed  MAHKAYLA PREECE is a 66 y.o. female with history of chronic recurrent iron deficiency anemia, history of PE and DVT on Xarelto, hypertension, history of endometrial cancer, radiation cystitis, history of GAVE is admitted with acute on chronic symptomatic severe iron deficiency anemia, melena in setting of  anticoagulation  Plan: Melena, acute on chronic iron deficiency anemia S/p 2 units of PRBCs Patient is currently hemodynamically stable with no further episodes of melena or hematochezia since admission Maintain 2 large-bore IVs Continue pantoprazole drip Patient's last dose of Xarelto was on 7/25 6 PM, recommended to hold at least for 48 hours before proceeding with endoscopic evaluation given patient is hemodynamically stable.  We will plan for upper endoscopy on Friday, if EGD is unremarkable, recommend colonoscopy and possible video capsule endoscopy Continue full liquid diet today  N.p.o. effective 5 AM tomorrow Monitor CBC closely, maintain hemoglobin above 7 Recommend IV iron x 1 dose and oral B12 1000 mcg daily   LOS: 2 days   Andrianna Manalang 01/31/2022, 10:35 PM

## 2022-01-31 NOTE — Progress Notes (Signed)
Nutrition Follow-up  DOCUMENTATION CODES:   Obesity unspecified  INTERVENTION:   -Ensure Enlive po TID, each supplement provides 350 kcal and 20 grams of protein -MVI with minerals daily  NUTRITION DIAGNOSIS:   Inadequate oral intake related to poor appetite as evidenced by per patient/family report.  GOAL:   Patient will meet greater than or equal to 90% of their needs  MONITOR:   PO intake, Supplement acceptance, Diet advancement  REASON FOR ASSESSMENT:   Malnutrition Screening Tool    ASSESSMENT:   Pt with known history of iron deficiency anemia, history of PE and DVT on Xarelto, hypertension, history of endometrial cancer, radiation cystitis, history of GAVE presented secondary to chest pain and shortness of breath  Pt admitted with acute anemia due to GI blood loss.   Reviewed I/O's: +2.1 L x 24 hours and +4.7 L since admission   UOP: 1.1 L x 24 hours  Spoke with pt, who was sitting in recliner chair at time of visit. Pt reports fair appetite, but tolerating liquids well, consuming 100% of breakfast. RD also helped pt with lunch tray set up.   Per pt, she typically has a fair appetite at home. She consumes 3-4 meals per day (Breakfast: cereal; Lunch: sandwich; Dinner: meat, starch, and vegetable; Snack: goldfish).   Per pt, pt has lost a significant amount of weight over the past 6 months related to stomach issues (264# to 174#). Reviewed wt hx; pt has experienced a 13.5% wt loss over the past 3 months, which is significant for time frame.   Discussed importance of good meal and supplement intake to promote healing. Pt amenable to supplements.   Per GI notes, plan for endoscopy tomorrow.   Medications reviewed and include vitamin B-12.   Labs reviewed: CBGS: 110.   NUTRITION - FOCUSED PHYSICAL EXAM:  Flowsheet Row Most Recent Value  Orbital Region No depletion  Upper Arm Region Mild depletion  Thoracic and Lumbar Region No depletion  Buccal Region No  depletion  Temple Region No depletion  Clavicle Bone Region No depletion  Clavicle and Acromion Bone Region No depletion  Scapular Bone Region No depletion  Dorsal Hand No depletion  Patellar Region Moderate depletion  Anterior Thigh Region Moderate depletion  Posterior Calf Region Moderate depletion  Edema (RD Assessment) Moderate  Hair Reviewed  Eyes Reviewed  Mouth Reviewed  Skin Reviewed  Nails Reviewed       Diet Order:   Diet Order             Diet NPO time specified Except for: Sips with Meds, Ice Chips  Diet effective 0500 tomorrow           Diet full liquid Room service appropriate? Yes; Fluid consistency: Thin  Diet effective now                   EDUCATION NEEDS:   Education needs have been addressed  Skin:  Skin Assessment: Reviewed RN Assessment  Last BM:  01/31/22 (type 4)  Height:   Ht Readings from Last 1 Encounters:  01/29/22 '4\' 11"'$  (1.499 m)    Weight:   Wt Readings from Last 1 Encounters:  01/29/22 81.6 kg    Ideal Body Weight:  44.7 kg  BMI:  Body mass index is 36.36 kg/m.  Estimated Nutritional Needs:   Kcal:  1600-1800  Protein:  75-90 grams  Fluid:  > 1.6 L    Loistine Chance, RD, LDN, [place] Registered Dietitian II Certified Diabetes Care  and Education Specialist Please refer to [facility] for RD and/or RD on-call/weekend/after hours pager

## 2022-01-31 NOTE — Progress Notes (Signed)
IVT consulted for difficult PIV assessment/placement.  Upon arrival, two PIV's in place and infusing; one PIV documented.  Spoke with primary, RN, Gerald Stabs who will d/c the PIV consult.

## 2022-01-31 NOTE — Care Management (Signed)
RNCM attempted to see patient, however patient was receiving care.  Patient was previously set up with home health through [facility].  RNCM reached out to [facility] from [facility], who states they can accept patient back to their services.  TOC will follow up with patient to continue assessment at a later time.

## 2022-02-01 ENCOUNTER — Inpatient Hospital Stay: Payer: Medicare (Managed Care) | Admitting: Anesthesiology

## 2022-02-01 ENCOUNTER — Encounter
Admission: EM | Disposition: A | Discharge status: Home Health | Payer: Self-pay | Source: Home / Self Care | Attending: Internal Medicine

## 2022-02-01 ENCOUNTER — Encounter: Payer: Self-pay | Admitting: Internal Medicine

## 2022-02-01 DIAGNOSIS — R42 Dizziness and giddiness: Secondary | ICD-10-CM | POA: Diagnosis not present

## 2022-02-01 DIAGNOSIS — D500 Iron deficiency anemia secondary to blood loss (chronic): Secondary | ICD-10-CM

## 2022-02-01 DIAGNOSIS — K31819 Angiodysplasia of stomach and duodenum without bleeding: Secondary | ICD-10-CM | POA: Diagnosis not present

## 2022-02-01 DIAGNOSIS — D649 Anemia, unspecified: Secondary | ICD-10-CM | POA: Diagnosis not present

## 2022-02-01 HISTORY — PX: ESOPHAGOGASTRODUODENOSCOPY (EGD) WITH PROPOFOL: SHX5813

## 2022-02-01 SURGERY — ESOPHAGOGASTRODUODENOSCOPY (EGD) WITH PROPOFOL
Anesthesia: General

## 2022-02-01 MED ORDER — SODIUM CHLORIDE 0.9 % IV SOLN
INTRAVENOUS | Status: DC
Start: 2022-02-01 — End: 2022-02-01

## 2022-02-01 MED ORDER — PHENYLEPHRINE HCL (PRESSORS) 10 MG/ML IV SOLN
INTRAVENOUS | Status: DC | PRN
  Administered 2022-02-01: 80 ug via INTRAVENOUS

## 2022-02-01 MED ORDER — PANTOPRAZOLE SODIUM 40 MG PO TBEC
40.0000 mg | DELAYED_RELEASE_TABLET | Freq: Two times a day (BID) | ORAL | Status: DC
  Administered 2022-02-01: 40 mg via ORAL
  Filled 2022-02-01: qty 1

## 2022-02-01 MED ORDER — SODIUM CHLORIDE 0.9 % IV SOLN: Freq: Once | INTRAVENOUS | Status: AC

## 2022-02-01 MED ORDER — PROPOFOL 10 MG/ML IV BOLUS
INTRAVENOUS | Status: DC | PRN
  Administered 2022-02-01: 50 mg via INTRAVENOUS

## 2022-02-01 MED ORDER — PROPOFOL 500 MG/50ML IV EMUL
INTRAVENOUS | Status: DC | PRN
  Administered 2022-02-01: 90 ug/kg/min via INTRAVENOUS

## 2022-02-01 MED ORDER — LIDOCAINE HCL (CARDIAC) PF 100 MG/5ML IV SOSY
PREFILLED_SYRINGE | INTRAVENOUS | Status: DC | PRN
  Administered 2022-02-01: 50 mg via INTRAVENOUS

## 2022-02-01 NOTE — Progress Notes (Signed)
[facility] [place] NAME: Courtney Mack    MR#:  [id number]  DATE OF BIRTH:  July 26, 1955  SUBJECTIVE:  patient came in with dark black diarrheal stools and generalized weakness. She was found to have hemoglobin of 4.7. She is s/pthree unit blood transfusion. Hemoglobin is 8.0. No family at bedside.   Npo for procedure   VITALS:  Blood pressure (!) 125/55, pulse 70, temperature 98.8 F (37.1 C), resp. rate 16, height '4\' 11"'$  (1.499 m), weight 81.6 kg, SpO2 97 %.  PHYSICAL EXAMINATION:   GENERAL:  66 y.o.-year-old patient lying in the bed with no acute distress. Pallor+ LUNGS: Normal breath sounds bilaterally, no wheezing CARDIOVASCULAR: S1, S2 normal. No murmurs ABDOMEN: Soft, nontender, nondistended. Bowel sounds present.  EXTREMITIES: No  edema b/l.    NEUROLOGIC: nonfocal  patient is alert and awake, weak SKIN: No obvious rash, lesion, or ulcer.   LABORATORY PANEL:  CBC Recent Labs  Lab 01/30/22 1031  WBC 3.9*  HGB 8.0*  HCT 25.4*  PLT 198     Chemistries  Recent Labs  Lab 01/29/22 1930  NA 136  K 3.5  CL 104  CO2 24  GLUCOSE 115*  BUN 29*  CREATININE 0.90  CALCIUM 9.4    Cardiac Enzymes No results for input(s): "TROPONINI" in the last 168 hours. RADIOLOGY:  No results found.  Assessment and Plan Courtney Mack is a 66 y.o. female with known history of iron deficiency anemia, history of PE and DVT on Xarelto, hypertension, history of endometrial cancer, radiation cystitis, history of GAVE presented to ER last night secondary to chest pain and shortness of breath.  Patient had history of UTI and hematuria secondary to radiation cystitis in setting of anticoagulation, had a nephrostomy tube placed to the left side in 12/2021.  Patient is under [place] palliative care and is currently full code.  Patient reported several episodes of black tarry stools yesterday which brought her to the ER.  Labs revealed  hemoglobin 4.7, previously 7.8 on 12/31/2021.  Acute anemia due to G.I. blood loss, symptomatic history of GAVE history of chronic anticoagulation -- patient came in with hemoglobin of 4.7--- received three unit of blood transfusion-- 8.0 -- CT abdomen shows gastric ulcer versus questionable mass -- G.I. consultation with Dr. Marius Ditch plans for endoscopy Friday since patient took her Xarelto (Tuesday) -- full liquid diet -- monitor H&H and transfuse as needed -- CTA negative for active bleed  history of UTI H/o left nephrostomy tube placement -- recently completed treatment with linezolid -- patient denies any symptoms of dysuria and urinalysis not evident with UTI -- DC antibiotics  history of chronic diastolic congestive heart failure -- continue home meds. -- Well compensated  history of PE and recurrent DVT IVC filter placement--Per chart review patient had [place] IVC filter insertion on 08/18/2020 -- patient on Xarelto-- currently on hold  Dyslipidemia -- continue statins    Procedures: Family communication : Public relations account executive dters Consults : G.I. Dr. Marius Ditch CODE STATUS: full DVT Prophylaxis : SCD Level of care: Telemetry Medical Status is: Inpatient Remains inpatient appropriate because: G.I. bleed. Endoscopy plan for Friday    TOTAL TIME TAKING CARE OF THIS PATIENT: 35 minutes.  >50% time spent on counselling and coordination of care  Note: This dictation was prepared with Dragon dictation along with smaller phrase technology. Any transcriptional errors that result from this process are unintentional.  Courtney Mack M.D    [facility]  CC: Primary care physician; [facility] [place]

## 2022-02-01 NOTE — Op Note (Addendum)
[facility] Patient Name: Courtney Mack Procedure Date: 02/01/2022 2:46 PM MRN: [id number] Account #: [id number] Date of Birth: February 09, 1956 Admit Type: Outpatient Age: 66 Room: [facility] ENDO ROOM 1 Gender: Female Note Status: Finalized Instrument Name: Altamese Cabal Endoscope [id number] Procedure:             Upper GI endoscopy Indications:           Iron deficiency anemia secondary to chronic blood                         loss, Melena Providers:             Lin Landsman MD, MD Medicines:             General Anesthesia Complications:         No immediate complications. Estimated blood loss: None. Procedure:             Pre-Anesthesia Assessment:                        - Prior to the procedure, a History and Physical was                         performed, and patient medications and allergies were                         reviewed. The patient is competent. The risks and                         benefits of the procedure and the sedation options and                         risks were discussed with the patient. All questions                         were answered and informed consent was obtained.                         Patient identification and proposed procedure were                         verified by the physician, the nurse, the                         anesthesiologist, the anesthetist and the technician                         in the pre-procedure area in the procedure room in the                         endoscopy suite. Mental Status Examination: alert and                         oriented. Airway Examination: normal oropharyngeal                         airway and neck mobility. Respiratory Examination:                         clear to  auscultation. CV Examination: normal.                         Prophylactic Antibiotics: The patient does not require                         prophylactic antibiotics. Prior Anticoagulants: The                          patient has taken Xarelto (rivaroxaban), last dose was                         2 days prior to procedure. ASA Grade Assessment: III -                         A patient with severe systemic disease. After                         reviewing the risks and benefits, the patient was                         deemed in satisfactory condition to undergo the                         procedure. The anesthesia plan was to use general                         anesthesia. Immediately prior to administration of                         medications, the patient was re-assessed for adequacy                         to receive sedatives. The heart rate, respiratory                         rate, oxygen saturations, blood pressure, adequacy of                         pulmonary ventilation, and response to care were                         monitored throughout the procedure. The physical                         status of the patient was re-assessed after the                         procedure.                        After obtaining informed consent, the endoscope was                         passed under direct vision. Throughout the procedure,                         the patient's blood pressure, pulse, and oxygen  saturations were monitored continuously. The Endoscope                         was introduced through the mouth, and advanced to the                         second part of duodenum. The upper GI endoscopy was                         accomplished without difficulty. The patient tolerated                         the procedure well. Findings:      The duodenal bulb and second portion of the duodenum were normal.      Severe, diffuse gastric antral vascular ectasia without bleeding was       present in the gastric antrum and in the prepyloric region of the       stomach. Coagulation for hemostasis using argon plasma at 0.8       liters/minute and 40 watts was successful. Estimated blood  loss: none.      The cardia and gastric fundus were normal on retroflexion.      The gastroesophageal junction and examined esophagus were normal. Impression:            - Normal duodenal bulb and second portion of the                         duodenum.                        - Gastric antral vascular ectasia without bleeding.                         Treated with argon plasma coagulation (APC).                        - Normal gastroesophageal junction and esophagus.                        - No specimens collected. Recommendation:        - Return patient to hospital ward for ongoing care.                        - Resume regular diet today. Procedure Code(s):     --- Professional ---                        [phone], Esophagogastroduodenoscopy, flexible,                         transoral; with control of bleeding, any method Diagnosis Code(s):     --- Professional ---                        A41.660, Angiodysplasia of stomach and duodenum                         without bleeding                        K92.1, Melena (includes  Hematochezia)                        D50.0, Iron deficiency anemia secondary to blood loss                         (chronic) CPT copyright 2019 American Medical Association. All rights reserved. The codes documented in this report are preliminary and upon coder review may  be revised to meet current compliance requirements. Dr. Ulyess Mort Lin Landsman MD, MD 02/01/2022 3:41:56 PM This report has been signed electronically. Number of Addenda: 0 Note Initiated On: 02/01/2022 2:46 PM Estimated Blood Loss:  Estimated blood loss: none.      [facility]

## 2022-02-01 NOTE — Anesthesia Preprocedure Evaluation (Signed)
Anesthesia Evaluation  Patient identified by MRN, date of birth, ID band Patient awake    Reviewed: Allergy & Precautions, NPO status , Patient's Chart, lab work & pertinent test results  Airway Mallampati: II  TM Distance: >3 FB Neck ROM: Full    Dental  (+) Upper Dentures, Partial Lower, Poor Dentition   Pulmonary neg pulmonary ROS,  Hx of massive PE in the past   Pulmonary exam normal  + decreased breath sounds      Cardiovascular Exercise Tolerance: Poor hypertension, Pt. on medications +CHF and + DOE  negative cardio ROS Normal cardiovascular exam Rhythm:Regular     Neuro/Psych negative neurological ROS  negative psych ROS   GI/Hepatic negative GI ROS, Neg liver ROS,   Endo/Other  negative endocrine ROSdiabetes, Well Controlled, Type 2  Renal/GU CRFRenal diseasenegative Renal ROS  negative genitourinary   Musculoskeletal   Abdominal (+) + obese,   Peds negative pediatric ROS (+)  Hematology negative hematology ROS (+) Blood dyscrasia, anemia ,   Anesthesia Other Findings Past Medical History: 01/02/2021: Acute bilateral deep vein thrombosis (DVT) of femoral  veins ([place]) 08/25/2020: Acute massive pulmonary embolism (HCC)     Comment:  Last Assessment & Plan:  Formatting of this note might               be different from the original. Post vena caval filter               and on xarelto and O2 No date: Anemia No date: ARF (acute respiratory failure) (HCC) No date: Cancer [facility])     Comment:  endometrial 06/20/2021: Cellulitis of left lower leg No date: CHF (congestive heart failure) (HCC) No date: COVID-19 No date: Diabetes mellitus without complication ([place]) 55/73/2202: DVT (deep venous thrombosis) (HCC) No date: High cholesterol No date: Hx of blood clots No date: Hyperlipidemia No date: Hypertension 09/21/2020: IDA (iron deficiency anemia) No date: Obesity 06/21/2021: Pressure injury of skin No  date: Pulmonary embolism (HCC) No date: Sepsis ([place]) No date: Thrombocytopenia ([place]) 09/13/2021: Urinary tract infection  Past Surgical History: 06/21/2021: COLONOSCOPY; N/A     Comment:  Procedure: COLONOSCOPY;  Surgeon: Toledo, Benay Pike, MD;              Location: [facility] ENDOSCOPY;  Service: Gastroenterology;                Laterality: N/A; 09/11/2021: CYSTOSCOPY W/ URETERAL STENT PLACEMENT; Left     Comment:  Procedure: CYSTOSCOPY WITH RETROGRADE PYELOGRAM/URETERAL              STENT PLACEMENT;  Surgeon: Abbie Sons, MD;                Location: [facility] ORS;  Service: Urology;  Laterality: Left; 11/17/2021: CYSTOSCOPY W/ URETERAL STENT PLACEMENT; Left     Comment:  Procedure: CYSTOSCOPY WITH STENT REPLACEMENT;  Surgeon:               Irine Seal, MD;  Location: [facility] ORS;  Service: Urology;               Laterality: Left; 11/17/2021: CYSTOSCOPY WITH FULGERATION     Comment:  Procedure: CYSTOSCOPY WITH FULGERATION;  Surgeon: Irine Seal, MD;  Location: [facility] ORS;  Service: Urology;; 10/17/2021: [place]; Left     Comment:  Procedure: CYSTOSCOPY WITH STENT PLACEMENT;  Surgeon:  Stoioff, Ronda Fairly, MD;  Location: [facility] ORS;  Service:               Urology;  Laterality: Left; 10/16/2021: CYSTOSCOPY WITH URETEROSCOPY, STONE BASKETRY AND STENT  PLACEMENT; Left     Comment:  Procedure: CYSTOSCOPY WITH URETEROSCOPY  AND STENT               REMOVAL;  Surgeon: Abbie Sons, MD;  Location: [facility]               ORS;  Service: Urology;  Laterality: Left; 08/17/2021: ENTEROSCOPY; N/A     Comment:  Procedure: ENTEROSCOPY;  Surgeon: Lin Landsman,               MD;  Location: [facility] ENDOSCOPY;  Service:               Gastroenterology;  Laterality: N/A; 06/21/2021: ESOPHAGOGASTRODUODENOSCOPY; N/A     Comment:  Procedure: ESOPHAGOGASTRODUODENOSCOPY (EGD);  Surgeon:               Toledo, Benay Pike, MD;  Location: [facility] ENDOSCOPY;                Service:  Gastroenterology;  Laterality: N/A; 08/17/2021: ESOPHAGOGASTRODUODENOSCOPY (EGD) WITH PROPOFOL; N/A     Comment:  Procedure: ESOPHAGOGASTRODUODENOSCOPY (EGD) WITH               PROPOFOL;  Surgeon: Lin Landsman, MD;  Location:               [facility] ENDOSCOPY;  Service: Gastroenterology;  Laterality:               N/A; 10/14/2021: ESOPHAGOGASTRODUODENOSCOPY (EGD) WITH PROPOFOL; N/A     Comment:  Procedure: ESOPHAGOGASTRODUODENOSCOPY (EGD) WITH               PROPOFOL;  Surgeon: [name], MD;  Location: [facility]               ENDOSCOPY;  Service: Endoscopy;  Laterality: N/A; 06/22/2021: GIVENS CAPSULE STUDY; N/A     Comment:  Procedure: GIVENS CAPSULE STUDY;  Surgeon: Toledo,               Benay Pike, MD;  Location: [facility] ENDOSCOPY;  Service:               Gastroenterology;  Laterality: N/A; 12/27/2021: IR NEPHROSTOMY PLACEMENT LEFT 08/18/2020: IVC FILTER INSERTION; N/A     Comment:  Procedure: IVC FILTER INSERTION;  Surgeon: Katha Cabal, MD;  Location: [place] [facility];  Service:              Cardiovascular;  Laterality: N/A; 01/03/2021: PERIPHERAL VASCULAR THROMBECTOMY; Bilateral     Comment:  Procedure: PERIPHERAL VASCULAR THROMBECTOMY;  Surgeon:               Algernon Huxley, MD;  Location: [place] [facility];                Service: Cardiovascular;  Laterality: Bilateral; 08/18/2020: PULMONARY THROMBECTOMY; N/A     Comment:  Procedure: PULMONARY THROMBECTOMY;  Surgeon: Katha Cabal, MD;  Location: [place] [facility];  Service:              Cardiovascular;  Laterality: N/A; No date: TUBAL LIGATION 07/18/2021: VISCERAL ANGIOGRAPHY; N/A     Comment:  Procedure: VISCERAL ANGIOGRAPHY;  Surgeon: Algernon Huxley,              MD;  Location: [place] [facility];  Service:               Cardiovascular;  Laterality: N/A;  BMI    Body Mass Index: 36.36 kg/m      Reproductive/Obstetrics negative OB ROS                              Anesthesia Physical Anesthesia Plan  ASA: 3  Anesthesia Plan: General   Post-op Pain Management:    Induction: Intravenous  PONV Risk Score and Plan: Propofol infusion and TIVA  Airway Management Planned: Natural Airway  Additional Equipment:   Intra-op Plan:   Post-operative Plan:   Informed Consent: I have reviewed the patients History and Physical, chart, labs and discussed the procedure including the risks, benefits and alternatives for the proposed anesthesia with the patient or authorized representative who has indicated his/her understanding and acceptance.     Dental Advisory Given  Plan Discussed with: CRNA and Surgeon  Anesthesia Plan Comments:         Anesthesia Quick Evaluation

## 2022-02-01 NOTE — Care Management Important Message (Signed)
Important Message  Patient Details  Name: Courtney Mack MRN: [id number] Date of Birth: [birth year]   Medicare Important Message Given:  Yes     [name] A Keyshawna Prouse 02/01/2022, 10:57 AM

## 2022-02-01 NOTE — Care Management (Signed)
RNCM attempted to see patient, however patient was in procedure at that time   TOC to follow.

## 2022-02-01 NOTE — Transfer of Care (Addendum)
Immediate Anesthesia Transfer of Care Note  Patient: Courtney Mack  Procedure(s) Performed: ESOPHAGOGASTRODUODENOSCOPY (EGD) WITH PROPOFOL  Patient Location: PACU and Endoscopy Unit  Anesthesia Type:MAC  Level of Consciousness: drowsy  Airway & Oxygen Therapy: Patient Spontanous Breathing and Patient connected to nasal cannula oxygen  Post-op Assessment: Report given to RN and Post -op Vital signs reviewed and stable  Post vital signs: Reviewed and stable  Last Vitals:  Vitals Value Taken Time  BP    Temp    Pulse    Resp    SpO2      Last Pain:  Vitals:   02/01/22 0953  TempSrc:   PainSc: 0-No pain         Complications: No notable events documented.

## 2022-02-01 NOTE — Progress Notes (Signed)
PT Cancellation Note  Patient Details Name: Courtney Mack MRN: [id number] DOB: [birth year]   Cancelled Treatment:    Reason Eval/Treat Not Completed: Other (comment) Attempted to see pt for PT tx but upon attempt pt out of room. Will f/u as able.  Lavone Nian, PT, DPT 02/01/22, 1:53 PM  Waunita Schooner 02/01/2022, 1:53 PM

## 2022-02-02 LAB — CBC
HCT: 25.3 % — ABNORMAL LOW (ref 36.0–46.0)
Hemoglobin: 7.8 g/dL — ABNORMAL LOW (ref 12.0–15.0)
MCH: 26.3 pg (ref 26.0–34.0)
MCHC: 30.8 g/dL (ref 30.0–36.0)
MCV: 85.2 fL (ref 80.0–100.0)
Platelets: 225 10*3/uL (ref 150–400)
RBC: 2.97 MIL/uL — ABNORMAL LOW (ref 3.87–5.11)
RDW: 18.4 % — ABNORMAL HIGH (ref 11.5–15.5)
WBC: 4.9 10*3/uL (ref 4.0–10.5)
nRBC: 0 % (ref 0.0–0.2)

## 2022-02-02 LAB — BPAM RBC
Blood Product Expiration Date: 202308162359
Blood Product Expiration Date: 202308172359
Blood Product Expiration Date: 202308182359
Blood Product Expiration Date: 202308212359
Blood Product Expiration Date: 202308292359
ISSUE DATE / TIME: 202307260045
ISSUE DATE / TIME: 202307260431
ISSUE DATE / TIME: 202307260753
Unit Type and Rh: 5100
Unit Type and Rh: 6200
Unit Type and Rh: 6200
Unit Type and Rh: 6200
Unit Type and Rh: 6200

## 2022-02-02 LAB — TYPE AND SCREEN
ABO/RH(D): A POS
Antibody Screen: POSITIVE
Donor AG Type: NEGATIVE
Donor AG Type: NEGATIVE
Donor AG Type: NEGATIVE
Unit division: 0
Unit division: 0
Unit division: 0
Unit division: 0
Unit division: 0

## 2022-02-02 LAB — BASIC METABOLIC PANEL
Anion gap: 5 (ref 5–15)
BUN: 15 mg/dL (ref 8–23)
CO2: 24 mmol/L (ref 22–32)
Calcium: 8.4 mg/dL — ABNORMAL LOW (ref 8.9–10.3)
Chloride: 110 mmol/L (ref 98–111)
Creatinine, Ser: 0.84 mg/dL (ref 0.44–1.00)
GFR, Estimated: >60 mL/min (ref >60)
Glucose, Bld: 114 mg/dL — ABNORMAL HIGH (ref 70–99)
Potassium: 4.1 mmol/L (ref 3.5–5.1)
Sodium: 139 mmol/L (ref 135–145)

## 2022-02-02 MED ORDER — CYANOCOBALAMIN 1000 MCG PO TABS: 1000.0000 ug | ORAL_TABLET | Freq: Every day | ORAL | 0 refills | Status: DC

## 2022-02-02 MED ORDER — PANTOPRAZOLE SODIUM 40 MG PO TBEC
40.0000 mg | DELAYED_RELEASE_TABLET | ORAL | Status: DC
  Administered 2022-02-02: 40 mg via ORAL
  Filled 2022-02-02: qty 1

## 2022-02-02 MED ORDER — CALCIUM CARBONATE 1250 (500 CA) MG PO TABS: 1.0000 | ORAL_TABLET | Freq: Once | ORAL | Status: DC

## 2022-02-02 MED ORDER — RIVAROXABAN 10 MG PO TABS
10.0000 mg | ORAL_TABLET | Freq: Every day | ORAL | Status: DC
  Administered 2022-02-02: 10 mg via ORAL
  Filled 2022-02-02: qty 1

## 2022-02-02 MED ORDER — CALCIUM CARBONATE ANTACID 500 MG PO CHEW
1.0000 | CHEWABLE_TABLET | Freq: Two times a day (BID) | ORAL | Status: DC
Start: 2022-02-02 — End: 2022-02-02

## 2022-02-02 MED ORDER — ASCORBIC ACID 500 MG PO TABS
1000.0000 mg | ORAL_TABLET | Freq: Every day | ORAL | Status: DC
  Administered 2022-02-02: 1000 mg via ORAL
  Filled 2022-02-02: qty 2

## 2022-02-02 MED ORDER — ADULT MULTIVITAMIN W/MINERALS CH
1.0000 | ORAL_TABLET | Freq: Every day | ORAL | Status: DC
Start: 2022-02-02 — End: 2022-02-02
  Administered 2022-02-02: 1 via ORAL
  Filled 2022-02-02: qty 1

## 2022-02-02 MED ORDER — RIVAROXABAN 10 MG PO TABS: ORAL_TABLET | ORAL | 2 refills | Status: DC

## 2022-02-02 MED ORDER — ENSURE ENLIVE PO LIQD: 237.0000 mL | Freq: Three times a day (TID) | ORAL | 12 refills | Status: DC

## 2022-02-02 MED ORDER — ROSUVASTATIN CALCIUM 10 MG PO TABS
10.0000 mg | ORAL_TABLET | Freq: Every day | ORAL | Status: DC
  Administered 2022-02-02: 10 mg via ORAL
  Filled 2022-02-02: qty 1

## 2022-02-02 MED ORDER — ALUM & MAG HYDROXIDE-SIMETH 200-200-20 MG/5ML PO SUSP
30.0000 mL | Freq: Four times a day (QID) | ORAL | Status: DC | PRN
  Filled 2022-02-02: qty 30

## 2022-02-02 NOTE — Plan of Care (Signed)
  Problem: Education: Goal: Knowledge of General Education information will improve Description: Including pain rating scale, medication(s)/side effects and non-pharmacologic comfort measures 02/02/2022 1358 by Alferd Apa, RN Outcome: Adequate for Discharge 02/02/2022 0825 by Alferd Apa, RN Outcome: Progressing   Problem: Health Behavior/Discharge Planning: Goal: Ability to manage health-related needs will improve 02/02/2022 1358 by Alferd Apa, RN Outcome: Adequate for Discharge 02/02/2022 0825 by Alferd Apa, RN Outcome: Progressing   Problem: Clinical Measurements: Goal: Ability to maintain clinical measurements within normal limits will improve 02/02/2022 1358 by Alferd Apa, RN Outcome: Adequate for Discharge 02/02/2022 0825 by Alferd Apa, RN Outcome: Progressing Goal: Will remain free from infection 02/02/2022 1358 by Alferd Apa, RN Outcome: Adequate for Discharge 02/02/2022 0825 by Alferd Apa, RN Outcome: Progressing Goal: Diagnostic test results will improve 02/02/2022 1358 by Alferd Apa, RN Outcome: Adequate for Discharge 02/02/2022 0825 by Alferd Apa, RN Outcome: Progressing Goal: Respiratory complications will improve 02/02/2022 1358 by Alferd Apa, RN Outcome: Adequate for Discharge 02/02/2022 0825 by Alferd Apa, RN Outcome: Progressing Goal: Cardiovascular complication will be avoided 02/02/2022 1358 by Alferd Apa, RN Outcome: Adequate for Discharge 02/02/2022 0825 by Alferd Apa, RN Outcome: Progressing   Problem: Activity: Goal: Risk for activity intolerance will decrease 02/02/2022 1358 by Alferd Apa, RN Outcome: Adequate for Discharge 02/02/2022 0825 by Alferd Apa, RN Outcome: Progressing   Problem: Nutrition: Goal: Adequate nutrition will be maintained 02/02/2022 1358 by Alferd Apa, RN Outcome: Adequate for Discharge 02/02/2022 0825 by Alferd Apa, RN Outcome: Progressing   Problem: Coping: Goal: Level of anxiety  will decrease 02/02/2022 1358 by Alferd Apa, RN Outcome: Adequate for Discharge 02/02/2022 0825 by Alferd Apa, RN Outcome: Progressing   Problem: Elimination: Goal: Will not experience complications related to bowel motility 02/02/2022 1358 by Alferd Apa, RN Outcome: Adequate for Discharge 02/02/2022 0825 by Alferd Apa, RN Outcome: Progressing Goal: Will not experience complications related to urinary retention 02/02/2022 1358 by Alferd Apa, RN Outcome: Adequate for Discharge 02/02/2022 0825 by Alferd Apa, RN Outcome: Progressing   Problem: Pain Managment: Goal: General experience of comfort will improve 02/02/2022 1358 by Alferd Apa, RN Outcome: Adequate for Discharge 02/02/2022 0825 by Alferd Apa, RN Outcome: Progressing   Problem: Safety: Goal: Ability to remain free from injury will improve 02/02/2022 1358 by Alferd Apa, RN Outcome: Adequate for Discharge 02/02/2022 0825 by Alferd Apa, RN Outcome: Progressing   Problem: Skin Integrity: Goal: Risk for impaired skin integrity will decrease 02/02/2022 1358 by Alferd Apa, RN Outcome: Adequate for Discharge 02/02/2022 0825 by Alferd Apa, RN Outcome: Progressing

## 2022-02-02 NOTE — Discharge Summary (Addendum)
Physician Discharge Summary   Patient: Courtney Mack MRN: [id number] DOB: 04-05-1956  Admit date:     01/29/2022  Discharge date: 02/02/22  Discharge Physician: Fritzi Mandes   PCP: [facility] [place]   Recommendations at discharge:    F/u PCP in 1-2 weeks F/u GI in 2-3 weeks  Discharge Diagnoses: acute on chronic blood loss anemia symptomatic secondary to [place] [facility] Course: Courtney Mack is a 66 y.o. female with known history of iron deficiency anemia, history of PE and DVT on Xarelto, hypertension, history of endometrial cancer, radiation cystitis, history of GAVE presented to ER last night secondary to chest pain and shortness of breath.  Patient had history of UTI and hematuria secondary to radiation cystitis in setting of anticoagulation, had a nephrostomy tube placed to the left side in 12/2021.  Patient is under [facility] palliative care and is currently full code.  Patient reported several episodes of black tarry stools yesterday which brought her to the ER.  Labs revealed hemoglobin 4.7, previously 7.8 on 12/31/2021.   Acute on chronic anemia due to G.I. blood loss, symptomatic history of GAVE history of chronic anticoagulation -- patient came in with hemoglobin of 4.7--- received three unit of blood transfusion-- 8.0 -- CT abdomen shows gastric ulcer versus questionable mass -- G.I. consultation with Dr. Marius Ditch plans for endoscopy Friday since patient took her Xarelto (Tuesday) -- full liquid diet -- monitor H&H and transfuse as needed -- CTA negative for active bleed --Endoscopy - Normal duodenal bulb and second portion of the                         duodenum.                        - Gastric antral vascular ectasia without bleeding.                         Treated with argon plasma coagulation (APC).                        - Normal gastroesophageal junction and esophagus. --ok to resume Xarelto per GI   history of UTI H/o left nephrostomy tube  placement -- recently completed treatment with linezolid -- patient denies any symptoms of dysuria and urinalysis not evident with UTI   history of chronic diastolic congestive heart failure -- continue home meds. -- Well compensated   History of PE and recurrent DVT IVC filter placement--Per chart review patient had [place] IVC filter insertion on 08/18/2020 -- patient on Xarelto-- it was held--now resumed at discharge   Dyslipidemia -- continue statins    overall stable. D/c home with HHPT   Procedures:EGD Family communication : Public relations account executive dters Consults : G.I. Dr. Marius Ditch CODE STATUS: full DVT Prophylaxis : SCD       Disposition: Home health Diet recommendation:  Discharge Diet Orders (From admission, onward)     Start     Ordered   02/02/22 0000  Diet - low sodium heart healthy        02/02/22 1346           Cardiac diet DISCHARGE MEDICATION: Allergies as of 02/02/2022   No Known Allergies      Medication List     STOP taking these medications    Iron 325 (65 Fe) MG Tabs  TAKE these medications    acetaminophen 500 MG tablet Commonly known as: TYLENOL Take 500 mg by mouth every 6 (six) hours as needed for mild pain or fever.   Breo Ellipta 100-25 MCG/ACT Aepb Generic drug: fluticasone furoate-vilanterol Inhale 1 puff into the lungs daily.   cyanocobalamin 1000 MCG tablet Take 1 tablet (1,000 mcg total) by mouth daily. Start taking on: February 03, 2022   feeding supplement Liqd Take 237 mLs by mouth 3 (three) times daily between meals.   furosemide 20 MG tablet Commonly known as: Lasix Hold due to low blood pressure. What changed:  how much to take how to take this when to take this   loperamide 2 MG capsule Commonly known as: IMODIUM Take 1 capsule (2 mg total) by mouth as needed for diarrhea or loose stools.   multivitamin with minerals Tabs tablet Take 1 tablet by mouth daily.   pantoprazole 40 MG tablet Commonly known as:  PROTONIX Take 40 mg by mouth every morning.   rivaroxaban 10 MG Tabs tablet Commonly known as: XARELTO Hold until followup with urology.   rosuvastatin 10 MG tablet Commonly known as: CRESTOR Take 1 tablet (10 mg total) by mouth daily. What changed: when to take this   vitamin C 1000 MG tablet Take 1,000 mg by mouth daily.        Follow-up [place] Call in 1 week(s).   Specialty: General Practice Why: hospital f/u Contact information: [address] [place]. [place] [address] [phone]         Lin Landsman, MD. Call in 2 week(s).   Specialty: Gastroenterology Why: GI bleed f/u Contact information: [place] [address] [phone]                Discharge Exam: Danley Danker Weights   01/29/22 1925  Weight: 81.6 kg     Condition at discharge: fair  The results of significant diagnostics from this hospitalization (including imaging, microbiology, ancillary and laboratory) are listed below for reference.   Imaging Studies: CT ANGIO GI BLEED  Result Date: 01/30/2022 CLINICAL DATA:  Presyncope, black stool per rectum, GI bleed EXAM: CTA ABDOMEN AND PELVIS WITHOUT AND WITH CONTRAST TECHNIQUE: Multidetector CT imaging of the abdomen and pelvis was performed using the standard protocol during bolus administration of intravenous contrast. Multiplanar reconstructed images and MIPs were obtained and reviewed to evaluate the vascular anatomy. RADIATION DOSE REDUCTION: This exam was performed according to the departmental dose-optimization program which includes automated exposure control, adjustment of the mA and/or kV according to patient size and/or use of iterative reconstruction technique. CONTRAST:  184m OMNIPAQUE IOHEXOL 350 MG/ML SOLN COMPARISON:  CT abdomen/pelvis dated 12/24/2021 FINDINGS: VASCULAR Aorta: No evidence abdominal aortic aneurysm or dissection. Patent. Atherosclerotic  calcifications. Celiac: Patent.  Atherosclerotic calcifications at the origin. SMA: Patent.  Atherosclerotic calcifications of the origin. Renals: Patent. Atherosclerotic calcifications at the origin bilaterally. IMA: Patent. Inflow: Patent bilaterally.  Atherosclerotic calcifications. Proximal Outflow: Patent. Veins: IVC filter.  Otherwise unremarkable. No active GI bleeding. See below for additional pertinent findings in the stomach. Review of the MIP images confirms the above findings. NON-VASCULAR Lower chest: Lung bases are clear. Hepatobiliary: Liver is within normal limits. Gallbladder is unremarkable. No intrahepatic or extrahepatic ductal dilatation. Pancreas: Within normal limits. Spleen: Within normal limits. Adrenals/Urinary Tract: Adrenal glands are within normal limits. Left kidney is notable for a percutaneous nephrostomy. Right kidney is within normal limits. No  hydronephrosis. Bladder is mildly thick-walled with perivesical stranding (series 7/image 35), although underdistended. Stomach/Bowel: Stomach is notable for mural hyperenhancement with submucosal edema along the gastric antrum (series 12/image 27), suspicious for a gastric ulcer. Slightly asymmetric/focal thickening along the superior wall of the gastric antrum at least raises the possibility of an underlying lesion in this location. No evidence of bowel obstruction. Normal appendix (series 6/image 152). No colonic wall thickening or inflammatory changes. Lymphatic: No suspicious abdominopelvic lymphadenopathy. Reproductive: Uterus is within normal limits. No adnexal masses. Other: No abdominopelvic ascites. Musculoskeletal: Degenerative changes of the visualized thoracolumbar spine. IMPRESSION: Suspected gastric ulcer in the gastric antrum. Underlying mass lesion is possible. Upper endoscopy is suggested. No evidence of active GI bleeding. Mildly thick-walled bladder with perivesical stranding, correlate for cystitis. Electronically Signed    By: Julian Hy M.D.   On: 01/30/2022 00:57   DG Chest Port 1 View  Result Date: 01/29/2022 CLINICAL DATA:  Chest pain and shortness of breath. EXAM: PORTABLE CHEST 1 VIEW COMPARISON:  None Available. FINDINGS: The right internal jugular venous catheter seen on the prior study has been removed. The heart size and mediastinal contours are within normal limits. Both lungs are clear. Multilevel degenerative changes are seen throughout the thoracic spine. IMPRESSION: No active cardiopulmonary disease. Electronically Signed   By: Virgina Norfolk M.D.   On: 01/29/2022 20:14   Abdomen 1 view (KUB)  Result Date: 01/04/2022 CLINICAL DATA:  Left hydronephrosis EXAM: ABDOMEN - 1 VIEW COMPARISON:  CT done on 12/25/2018 FINDINGS: Bowel gas pattern is nonspecific. Small to moderate amount of stool is seen in the colon. There is no fecal impaction in the rectosigmoid. There is interval removal of left ureteral stent and placement of left percutaneous nephrostomy catheter. Inferior vena caval filter is seen. Severe degenerative changes are noted in both hips. Degenerative changes are noted in the lower lumbar spine. IMPRESSION: Left percutaneous nephrostomy catheter is noted in place. Nonspecific bowel gas pattern. Other findings as described in the body of the report. Electronically Signed   By: Elmer Picker M.D.   On: 01/04/2022 19:26    Microbiology: Results for orders placed or performed during the hospital encounter of 01/29/22  MRSA Next Gen by PCR, Nasal     Status: Abnormal   Collection Time: 01/30/22  6:07 PM   Specimen: Nasal Mucosa; Nasal Swab  Result Value Ref Range Status   MRSA by PCR Next Gen DETECTED (A) NOT DETECTED Final    Comment: RESULT CALLED TO, READ BACK BY AND VERIFIED WITH: ERIN L[place] AT 2045 ON 01/30/22 BY SS (NOTE) The GeneXpert MRSA Assay (FDA approved for NASAL specimens only), is one component of a comprehensive MRSA colonization surveillance program. It is  not intended to diagnose MRSA infection nor to guide or monitor treatment for MRSA infections. Test performance is not FDA approved in patients less than 76 years old. Performed at [facility], [address] [place], [place] [address]     Labs: CBC: Recent Labs  Lab 01/29/22 1930 01/30/22 1031 02/02/22 0411  WBC 5.6 3.9* 4.9  HGB 4.7* 8.0* 7.8*  HCT 16.2* 25.4* 25.3*  MCV 81.0 84.9 85.2  PLT 246 198 256   Basic Metabolic Panel: Recent Labs  Lab 01/29/22 1930 02/02/22 0411  NA 136 139  K 3.5 4.1  CL 104 110  CO2 24 24  GLUCOSE 115* 114*  BUN 29* 15  CREATININE 0.90 0.84  CALCIUM 9.4 8.4*   Liver Function Tests: No results  for input(s): "AST", "ALT", "ALKPHOS", "BILITOT", "PROT", "ALBUMIN" in the last 168 hours. CBG: No results for input(s): "GLUCAP" in the last 168 hours.  Discharge time spent: greater than 30 minutes.  Signed: Fritzi Mandes, MD [facility] 02/02/2022

## 2022-02-02 NOTE — Plan of Care (Signed)

## 2022-02-02 NOTE — TOC Transition Note (Signed)
Transition of Care [facility]) - CM/SW Discharge Note   Patient Details  Name: Courtney Mack MRN: [id number] Date of Birth: [birth year]  [facility]) CM/SW Contact:  Izola Price, RN Phone Number: 02/02/2022, 2:15 PM   Clinical Narrative:  Patient discharging today. [facility] palliative to follow outpatient. [facility] for PT, confirmed with [facility]. Simmie Davies RN CM     Final next level of care: Home w Home Health Services Barriers to Discharge: Barriers Resolved   Patient Goals and CMS Choice        Discharge Placement                       Discharge Plan and Services                DME Arranged: N/A DME Agency: NA       HH Arranged: PT HH Agency: [facility] ([place]) Date HH Agency Contacted: 02/02/22 Time [facility]: [phone] (To confirm)    Social Determinants of Health (SDOH) Interventions     Readmission Risk Interventions    12/27/2021    2:26 PM 11/18/2021    9:04 AM 10/18/2021   11:23 AM  Readmission Risk Prevention Plan  Transportation Screening Complete Complete Complete  Medication Review Press photographer) Complete Complete Complete  PCP or Specialist appointment within 3-5 days of discharge  Complete Complete  HRI or Home Care Consult Complete Complete Complete  SW Recovery Care/Counseling Consult Complete Complete Complete  Palliative Care Screening Complete Complete Complete  Skilled Nursing Facility Not Applicable Complete Complete

## 2022-02-04 ENCOUNTER — Encounter: Payer: Self-pay | Admitting: Gastroenterology

## 2022-02-05 ENCOUNTER — Encounter
Admit: 2022-02-05 | Discharge: 2022-02-05 | Disposition: A | Discharge status: Home / Self Care | Payer: Medicare (Managed Care) | Attending: Urology | Admitting: Urology

## 2022-02-05 ENCOUNTER — Other Ambulatory Visit: Payer: Self-pay

## 2022-02-05 VITALS — Ht 59.0 in | Wt 174.0 lb

## 2022-02-05 DIAGNOSIS — E119 Type 2 diabetes mellitus without complications: Secondary | ICD-10-CM

## 2022-02-05 HISTORY — DX: Angiodysplasia of stomach and duodenum without bleeding: K31.819

## 2022-02-05 HISTORY — DX: Malignant neoplasm of endometrium: C54.1

## 2022-02-05 HISTORY — DX: Gastro-esophageal reflux disease without esophagitis: K21.9

## 2022-02-05 NOTE — Patient Instructions (Addendum)
Your procedure is scheduled on: 02/12/22 Report to [place]. To find out your arrival time please call [phone] between 1PM - 3PM on 02/11/22  .  Remember: Instructions that are not followed completely may result in serious medical risk, up to and including death, or upon the discretion of your surgeon and anesthesiologist your surgery may need to be rescheduled.     _X__ 1. Do not eat any food or drink any liquids after midnight the night before your procedure.                 No gum chewing or hard candies.   __X__2.  On the morning of surgery brush your teeth with toothpaste and water, you                 may rinse your mouth with mouthwash if you wish.  Do not swallow any              toothpaste of mouthwash.     _X__ 3.  No Alcohol for 24 hours before or after surgery.   _X__ 4.  Do Not Smoke or use e-cigarettes For 24 Hours Prior to Your Surgery.                 Do not use any chewable tobacco products for at least 6 hours prior to                 surgery.  ____  5.  Bring all medications with you on the day of surgery if instructed.   __X__  6.  Notify your doctor if there is any change in your medical condition      (cold, fever, infections).     Do not wear jewelry, make-up, hairpins, clips or nail polish. Do not wear lotions, powders, or perfumes.  Do not shave body hair 48 hours prior to surgery. Men may shave face and neck. Do not bring valuables to the hospital.    [facility] is not responsible for any belongings or valuables.  Contacts, dentures/partials or body piercings may not be worn into surgery. Bring a case for your contacts, glasses or hearing aids, a denture cup will be supplied. Leave your suitcase in the car. After surgery it may be brought to your room. For patients admitted to the hospital, discharge time is determined by your treatment team.   Patients discharged the day of surgery will not be  allowed to drive home.    __X__ Take these medicines the morning of surgery with A SIP OF WATER:    1. pantoprazole (PROTONIX) 40 MG tablet  2. rosuvastatin (CRESTOR) 10 MG tablet  3.   4.  5.  6.  ____ Fleet Enema (as directed)   ____ Use CHG Soap/SAGE wipes as directed  ____ Use inhalers on the day of surgery  ____ Stop metformin/Janumet/Farxiga 2 days prior to surgery    ____ Take 1/2 of usual insulin dose the night before surgery. No insulin the morning          of surgery.   ____ Stop Blood Thinners Coumadin/Plavix/Xarelto/Pleta/Pradaxa/Eliquis/Effient/Aspirin  on    Or contact your Surgeon, Cardiologist or Medical Doctor regarding  ability to stop your blood thinners  __X__ Stop Anti-inflammatories 7 days before surgery such as Advil, Ibuprofen, Motrin,  BC or Goodies Powder, Naprosyn, Naproxen, Aleve, Aspirin    __X__ Stop all herbals and supplements, fish oil or vitamins for  7 days until after surgery.    ____ Bring C-Pap to the hospital.

## 2022-02-06 NOTE — Anesthesia Postprocedure Evaluation (Signed)
Anesthesia Post Note  Patient: KRISSA UTKE  Procedure(s) Performed: ESOPHAGOGASTRODUODENOSCOPY (EGD) WITH PROPOFOL  Patient location during evaluation: PACU Anesthesia Type: General Level of consciousness: awake and awake and alert Pain management: satisfactory to patient Vital Signs Assessment: post-procedure vital signs reviewed and stable Respiratory status: spontaneous breathing and respiratory function stable Cardiovascular status: stable Anesthetic complications: no   No notable events documented.   Last Vitals:  Vitals:   02/02/22 0613 02/02/22 0817  BP: 102/61 (!) 105/45  Pulse: 85 75  Resp: 18 19  Temp: 36.8 C 37.2 C  SpO2: 97% 97%    Last Pain:  Vitals:   02/02/22 0745  TempSrc:   PainSc: 0-No pain                 VAN STAVEREN,Keyondra Lagrand

## 2022-02-08 ENCOUNTER — Encounter (INDEPENDENT_AMBULATORY_CARE_PROVIDER_SITE_OTHER): Payer: Self-pay | Admitting: Vascular Surgery

## 2022-02-08 ENCOUNTER — Encounter: Payer: Self-pay | Admitting: Oncology

## 2022-02-08 ENCOUNTER — Ambulatory Visit (INDEPENDENT_AMBULATORY_CARE_PROVIDER_SITE_OTHER): Payer: Medicare HMO | Admitting: Vascular Surgery

## 2022-02-08 VITALS — BP 116/75 | HR 89 | Resp 16

## 2022-02-08 DIAGNOSIS — E119 Type 2 diabetes mellitus without complications: Secondary | ICD-10-CM

## 2022-02-08 DIAGNOSIS — E782 Mixed hyperlipidemia: Secondary | ICD-10-CM | POA: Diagnosis not present

## 2022-02-08 DIAGNOSIS — I890 Lymphedema, not elsewhere classified: Secondary | ICD-10-CM | POA: Diagnosis not present

## 2022-02-08 NOTE — Assessment & Plan Note (Signed)
blood glucose control important in reducing the progression of atherosclerotic disease. Also, involved in wound healing. On appropriate medications.  

## 2022-02-08 NOTE — Progress Notes (Addendum)
MRN : [id number]  Courtney Mack is a 66 y.o. ([birth year]) female who presents with chief complaint of  Chief Complaint  Patient presents with   Follow-up    Ref [facility] consult lymphedema  .  History of Present Illness: Patient returns today in follow up of for lower extremity swelling and likely postphlebitic syndrome as well as lymphedema.  She had what sounds like a MRSA infection on her leg that was treated with antibiotics that improved.  She has had intermittent ulcerations on both lower extremities.  She has a Band-Aid on the scab on the left lateral calf and says this area has been on and off with an ulceration for several months.  No current fevers or chills.  No chest pain or shortness of breath.  She has been on 10 mg of Eliquis.  No current bleeding issues after her GI bleed last year.  She is not very mobile and her legs are dependent much of the time.  She tries to elevate and exercise but is limited.  She has not been able to get compression socks on her leg.  She has used wraps and other agents for compression but her swelling persists.  She has significant dermal thickening and skin changes on her legs.  Current Outpatient Medications  Medication Sig Dispense Refill   acetaminophen (TYLENOL) 500 MG tablet Take 500 mg by mouth every 6 (six) hours as needed for mild pain or fever.     Ascorbic Acid (VITAMIN C) 1000 MG tablet Take 1,000 mg by mouth daily.     BREO ELLIPTA 100-25 MCG/ACT AEPB Inhale 1 puff into the lungs daily.     cyanocobalamin 1000 MCG tablet Take 1 tablet (1,000 mcg total) by mouth daily. 30 tablet 0   feeding supplement (ENSURE ENLIVE / ENSURE PLUS) LIQD Take 237 mLs by mouth 3 (three) times daily between meals. 237 mL 12   furosemide (LASIX) 20 MG tablet Hold due to low blood pressure. (Patient taking differently: Take 20 mg by mouth daily. Hold due to low blood pressure.) 30 tablet 11   loperamide (IMODIUM) 2 MG capsule Take 1 capsule (2 mg total) by  mouth as needed for diarrhea or loose stools. 30 capsule 0   Multiple Vitamin (MULTIVITAMIN WITH MINERALS) TABS tablet Take 1 tablet by mouth daily.     pantoprazole (PROTONIX) 40 MG tablet Take 40 mg by mouth every morning.     rivaroxaban (XARELTO) 10 MG TABS tablet Hold until followup with urology. 30 tablet 2   rosuvastatin (CRESTOR) 10 MG tablet Take 1 tablet (10 mg total) by mouth daily. (Patient taking differently: Take 10 mg by mouth every morning.) 15 tablet 0   No current facility-administered medications for this visit.    Past Medical History:  Diagnosis Date   Acute bilateral deep vein thrombosis (DVT) of femoral veins ([place]) 01/02/2021   Acute massive pulmonary embolism ([place]) 08/25/2020   Last Assessment & Plan:  Formatting of this note might be different from the original. Post vena caval filter and on xarelto and O2   Anemia    ARF (acute respiratory failure) ([place])    Cellulitis of left lower leg 06/20/2021   CHF (congestive heart failure) ([place])    COVID-19    Diabetes mellitus without complication ([place])    DVT (deep venous thrombosis) ([place]) 09/06/2020   Endometrial cancer (HCC)    GAVE (gastric antral vascular ectasia)    GERD (gastroesophageal reflux disease)    High  cholesterol    Hx of blood clots    Hyperlipidemia    Hypertension    IDA (iron deficiency anemia) 09/21/2020   Obesity    Pressure injury of skin 06/21/2021   Pulmonary embolism ([place])    Sepsis ([place])    Thrombocytopenia ([place])    Urinary tract infection 09/13/2021    Past Surgical History:  Procedure Laterality Date   COLONOSCOPY N/A 06/21/2021   Procedure: COLONOSCOPY;  Surgeon: Toledo, Benay Pike, MD;  Location: [facility] ENDOSCOPY;  Service: Gastroenterology;  Laterality: N/A;   CYSTOSCOPY W/ URETERAL STENT PLACEMENT Left 09/11/2021   Procedure: CYSTOSCOPY WITH RETROGRADE PYELOGRAM/URETERAL STENT PLACEMENT;  Surgeon: Abbie Sons, MD;  Location: [facility] ORS;  Service: Urology;  Laterality: Left;    CYSTOSCOPY W/ URETERAL STENT PLACEMENT Left 11/17/2021   Procedure: CYSTOSCOPY WITH STENT REPLACEMENT;  Surgeon: Irine Seal, MD;  Location: [facility] ORS;  Service: Urology;  Laterality: Left;   CYSTOSCOPY WITH FULGERATION  11/17/2021   Procedure: CYSTOSCOPY WITH FULGERATION;  Surgeon: Irine Seal, MD;  Location: [facility] ORS;  Service: Urology;;   CYSTOSCOPY WITH STENT PLACEMENT Left 10/17/2021   Procedure: CYSTOSCOPY WITH STENT PLACEMENT;  Surgeon: Abbie Sons, MD;  Location: [facility] ORS;  Service: Urology;  Laterality: Left;   CYSTOSCOPY WITH URETEROSCOPY, STONE BASKETRY AND STENT PLACEMENT Left 10/16/2021   Procedure: CYSTOSCOPY WITH URETEROSCOPY  AND STENT REMOVAL;  Surgeon: Abbie Sons, MD;  Location: [facility] ORS;  Service: Urology;  Laterality: Left;   ENTEROSCOPY N/A 08/17/2021   Procedure: ENTEROSCOPY;  Surgeon: Lin Landsman, MD;  Location: [facility] ENDOSCOPY;  Service: Gastroenterology;  Laterality: N/A;   ESOPHAGOGASTRODUODENOSCOPY N/A 06/21/2021   Procedure: ESOPHAGOGASTRODUODENOSCOPY (EGD);  Surgeon: Toledo, Benay Pike, MD;  Location: [facility] ENDOSCOPY;  Service: Gastroenterology;  Laterality: N/A;   ESOPHAGOGASTRODUODENOSCOPY (EGD) WITH PROPOFOL N/A 08/17/2021   Procedure: ESOPHAGOGASTRODUODENOSCOPY (EGD) WITH PROPOFOL;  Surgeon: Lin Landsman, MD;  Location: [facility] ENDOSCOPY;  Service: Gastroenterology;  Laterality: N/A;   ESOPHAGOGASTRODUODENOSCOPY (EGD) WITH PROPOFOL N/A 10/14/2021   Procedure: ESOPHAGOGASTRODUODENOSCOPY (EGD) WITH PROPOFOL;  Surgeon: [name], MD;  Location: [facility] ENDOSCOPY;  Service: Endoscopy;  Laterality: N/A;   ESOPHAGOGASTRODUODENOSCOPY (EGD) WITH PROPOFOL N/A 02/01/2022   Procedure: ESOPHAGOGASTRODUODENOSCOPY (EGD) WITH PROPOFOL;  Surgeon: Lin Landsman, MD;  Location: [facility]COPY;  Service: Gastroenterology;  Laterality: N/A;   GIVENS CAPSULE STUDY N/A 06/22/2021   Procedure: GIVENS CAPSULE STUDY;  Surgeon: Toledo, Benay Pike, MD;  Location: [facility]  ENDOSCOPY;  Service: Gastroenterology;  Laterality: N/A;   IR NEPHROSTOMY PLACEMENT LEFT  12/27/2021   IVC FILTER INSERTION N/A 08/18/2020   Procedure: IVC FILTER INSERTION;  Surgeon: Katha Cabal, MD;  Location: [place] CV LAB;  Service: Cardiovascular;  Laterality: N/A;   PERIPHERAL VASCULAR THROMBECTOMY Bilateral 01/03/2021   Procedure: PERIPHERAL VASCULAR THROMBECTOMY;  Surgeon: Algernon Huxley, MD;  Location: [place] CV LAB;  Service: Cardiovascular;  Laterality: Bilateral;   PULMONARY THROMBECTOMY N/A 08/18/2020   Procedure: PULMONARY THROMBECTOMY;  Surgeon: Katha Cabal, MD;  Location: [place] CV LAB;  Service: Cardiovascular;  Laterality: N/A;   TUBAL LIGATION     VISCERAL ANGIOGRAPHY N/A 07/18/2021   Procedure: VISCERAL ANGIOGRAPHY;  Surgeon: Algernon Huxley, MD;  Location: [place] CV LAB;  Service: Cardiovascular;  Laterality: N/A;     Social History   Tobacco Use   Smoking status: Never   Smokeless tobacco: Never   Tobacco comments:    smoked 2-4 cigarettes for about 2-3 weeks   Vaping Use   Vaping  Use: Never used  Substance Use Topics   Alcohol use: Not Currently   Drug use: Not Currently       Family History  Problem Relation Age of Onset   Hypertension Mother    Diabetes Mother    Diabetes Father    Hypertension Father    High Cholesterol Father    Congestive Heart Failure Father    Breast cancer Cousin      No Known Allergies   REVIEW OF SYSTEMS (Negative unless checked)  Constitutional: '[]'$ Weight loss  '[]'$ Fever  '[]'$ Chills Cardiac: '[]'$ Chest pain   '[]'$ Chest pressure   '[]'$ Palpitations   '[x]'$ Shortness of breath when laying flat   '[]'$ Shortness of breath at rest   '[x]'$ Shortness of breath with exertion. Vascular:  '[]'$ Pain in legs with walking   '[]'$ Pain in legs at rest   '[]'$ Pain in legs when laying flat   '[]'$ Claudication   '[]'$ Pain in feet when walking  '[]'$ Pain in feet at rest  '[]'$ Pain in feet when laying flat   '[]'$ History of DVT   '[]'$ Phlebitis    '[x]'$ Swelling in legs   '[]'$ Varicose veins   '[x]'$ Non-healing ulcers Pulmonary:   '[]'$ Uses home oxygen   '[]'$ Productive cough   '[]'$ Hemoptysis   '[]'$ Wheeze  '[x]'$ COPD   '[]'$ Asthma Neurologic:  '[]'$ Dizziness  '[]'$ Blackouts   '[]'$ Seizures   '[]'$ History of stroke   '[]'$ History of TIA  '[]'$ Aphasia   '[]'$ Temporary blindness   '[]'$ Dysphagia   '[]'$ Weakness or numbness in arms   '[]'$ Weakness or numbness in legs Musculoskeletal:  '[x]'$ Arthritis   '[]'$ Joint swelling   '[x]'$ Joint pain   '[]'$ Low back pain Hematologic:  '[]'$ Easy bruising  '[]'$ Easy bleeding   '[]'$ Hypercoagulable state   '[]'$ Anemic   Gastrointestinal:  '[]'$ Blood in stool   '[]'$ Vomiting blood  '[]'$ Gastroesophageal reflux/heartburn   '[]'$ Abdominal pain Genitourinary:  '[]'$ Chronic kidney disease   '[]'$ Difficult urination  '[]'$ Frequent urination  '[]'$ Burning with urination   '[]'$ Hematuria Skin:  '[]'$ Rashes   '[x]'$ Ulcers   '[x]'$ Wounds Psychological:  '[]'$ History of anxiety   '[]'$  History of major depression.  Physical Examination  BP 116/75 (BP Location: Left Arm)   Pulse 89   Resp 16  Gen:  WD/WN, NAD Head: [place]/AT, No temporalis wasting. Ear/Nose/Throat: Hearing grossly intact, nares w/o erythema or drainage Eyes: Conjunctiva clear. Sclera non-icteric Neck: Supple.  Trachea midline Pulmonary:  Good air movement, no use of accessory muscles.  Cardiac: irregular Vascular:  Vessel Right Left  Radial Palpable Palpable                          PT Not Palpable Not Palpable  DP Not Palpable Not Palpable   Musculoskeletal: M/S 5/5 throughout.  No deformity or atrophy. 2-3+ BLE edema. Moderate stasis dermatitis changes. In a wheelchair.  There is significant dermal thickening and stasis changes in the lower extremity Neurologic: Sensation grossly intact in extremities.  Symmetrical.  Speech is fluent.  Psychiatric: Judgment intact, Mood & affect appropriate for pt's clinical situation. Dermatologic: No rashes or ulcers noted.  No cellulitis or open wounds.      Labs Recent Results (from the past 2160 hour(s))  CBC  with Differential     Status: Abnormal   Collection Time: 11/16/21  6:27 PM  Result Value Ref Range   WBC 6.0 4.0 - 10.5 K/uL   RBC 3.74 (L) 3.87 - 5.11 MIL/uL   Hemoglobin 9.7 (L) 12.0 - 15.0 g/dL   HCT 31.7 (L) 36.0 - 46.0 %   MCV 84.8 80.0 - 100.0 fL  MCH 25.9 (L) 26.0 - 34.0 pg   MCHC 30.6 30.0 - 36.0 g/dL   RDW 22.8 (H) 11.5 - 15.5 %   Platelets 208 150 - 400 K/uL   nRBC 0.0 0.0 - 0.2 %   Neutrophils Relative % 67 %   Neutro Abs 4.0 1.7 - 7.7 K/uL   Lymphocytes Relative 20 %   Lymphs Abs 1.2 0.7 - 4.0 K/uL   Monocytes Relative 7 %   Monocytes Absolute 0.4 0.1 - 1.0 K/uL   Eosinophils Relative 5 %   Eosinophils Absolute 0.3 0.0 - 0.5 K/uL   Basophils Relative 1 %   Basophils Absolute 0.0 0.0 - 0.1 K/uL   Immature Granulocytes 0 %   Abs Immature Granulocytes 0.02 0.00 - 0.07 K/uL    Comment: Performed at [facility], [address] [place], [place] [address]  Comprehensive metabolic panel     Status: Abnormal   Collection Time: 11/16/21  6:27 PM  Result Value Ref Range   Sodium 137 135 - 145 mmol/L   Potassium 3.4 (L) 3.5 - 5.1 mmol/L   Chloride 103 98 - 111 mmol/L   CO2 25 22 - 32 mmol/L   Glucose, Bld 138 (H) 70 - 99 mg/dL    Comment: Glucose reference range applies only to samples taken after fasting for at least 8 hours.   BUN 22 8 - 23 mg/dL   Creatinine, Ser 0.94 0.44 - 1.00 mg/dL   Calcium 8.8 (L) 8.9 - 10.3 mg/dL   Total Protein 7.2 6.5 - 8.1 g/dL   Albumin 3.2 (L) 3.5 - 5.0 g/dL   AST 24 15 - 41 U/L   ALT 11 0 - 44 U/L   Alkaline Phosphatase 49 38 - 126 U/L   Total Bilirubin 0.7 0.3 - 1.2 mg/dL   GFR, Estimated >60 >60 mL/min    Comment: (NOTE) Calculated using the CKD-EPI Creatinine Equation (2021)    Anion gap 9 5 - 15    Comment: Performed at [facility], [address] [place],  [address]  Type and screen [place]     Status: None   Collection Time: 11/16/21  6:27 PM  Result Value Ref Range    ABO/RH(D) A POS    Antibody Screen POS    Sample Expiration 11/19/2021,2359    Antibody Identification ANTI E    Unit Number [id number]    Blood Component Type RED CELLS,LR    Unit division 00    Status of Unit REL FROM [facility]    Transfusion Status OK TO TRANSFUSE    Crossmatch Result COMPATIBLE    Unit Number [id number]    Blood Component Type RED CELLS,LR    Unit division 00    Status of Unit ISSUED,FINAL    Transfusion Status OK TO TRANSFUSE    Crossmatch Result COMPATIBLE    Donor AG Type NEGATIVE FOR c ANTIGEN NEGATIVE FOR E ANTIGEN    Unit Number [id number]    Blood Component Type RED CELLS,LR    Unit division 00    Status of Unit REL FROM [facility]    Transfusion Status OK TO TRANSFUSE    Crossmatch Result COMPATIBLE   Hemoglobin A1c     Status: Abnormal   Collection Time: 11/16/21  6:27 PM  Result Value Ref Range   Hgb A1c MFr Bld 5.7 (H) 4.8 - 5.6 %    Comment: (NOTE) Pre diabetes:          5.7%-6.4%  Diabetes:              >6.4%  Glycemic control for   <7.0% adults with diabetes    Mean Plasma Glucose 116.89 mg/dL    Comment: Performed at [facility] [address], [place], [place] [address]  BPAM RBC     Status: None   Collection Time: 11/16/21  6:27 PM  Result Value Ref Range   Blood Product Unit Number [id number]    PRODUCT CODE T2671I45    Unit Type and Rh 8099    Blood Product Expiration Date 833825053976    ISSUE DATE / TIME [id number]    Blood Product Unit Number [id number]    PRODUCT CODE T4196Q22    Unit Type and Rh 9798    Blood Product Expiration Date 921194174081    Blood Product Unit Number [id number]    PRODUCT CODE [id number]    Unit Type and Rh 6200    Blood Product Expiration Date 502774128786   Urinalysis, Routine w reflex microscopic Urine, Clean Catch     Status: Abnormal   Collection Time: 11/16/21  9:07 PM  Result Value Ref Range   Color, Urine AMBER (A) YELLOW    Comment: BIOCHEMICALS MAY BE AFFECTED BY  COLOR   APPearance HAZY (A) CLEAR   Specific Gravity, Urine 1.006 1.005 - 1.030   pH 5.0 5.0 - 8.0   Glucose, UA NEGATIVE NEGATIVE mg/dL   Hgb urine dipstick LARGE (A) NEGATIVE   Bilirubin Urine NEGATIVE NEGATIVE   Ketones, ur NEGATIVE NEGATIVE mg/dL   Protein, ur NEGATIVE NEGATIVE mg/dL   Nitrite NEGATIVE NEGATIVE   Leukocytes,Ua SMALL (A) NEGATIVE   RBC / HPF >50 (H) 0 - 5 RBC/hpf   WBC, UA >50 (H) 0 - 5 WBC/hpf   Bacteria, UA RARE (A) NONE SEEN   Squamous Epithelial / LPF NONE SEEN 0 - 5    Comment: Performed at [facility], [address], [place], [place] [address]  Urine Culture     Status: None   Collection Time: 11/16/21  9:07 PM   Specimen: Urine, Clean Catch  Result Value Ref Range   Specimen Description      URINE, CLEAN CATCH Performed at [facility], [address], [place], [place] [address]    Special Requests      NONE Performed at [facility], [address], [place], [place] [address]    Culture      NO GROWTH Performed at [facility], [place] [address] [place], [place] [address]    Report Status 11/18/2021 FINAL   Lactic acid, plasma     Status: None   Collection Time: 11/16/21 10:23 PM  Result Value Ref Range   Lactic Acid, Venous 1.4 0.5 - 1.9 mmol/L    Comment: Performed at [facility], [address] [place], [place] [address]  Glucose, capillary     Status: None   Collection Time: 11/17/21 12:40 AM  Result Value Ref Range   Glucose-Capillary 98 70 - 99 mg/dL    Comment: Glucose reference range applies only to samples taken after fasting for at least 8 hours.  Lactic acid, plasma     Status: None   Collection Time: 11/17/21  1:09 AM  Result Value Ref Range   Lactic Acid, Venous 1.6 0.5 - 1.9 mmol/L    Comment: Performed at [facility], [address], [place], [place] [address]  Comprehensive metabolic panel  Status: Abnormal   Collection Time: 11/17/21  1:09 AM  Result Value Ref  Range   Sodium 136 135 - 145 mmol/L   Potassium 3.2 (L) 3.5 - 5.1 mmol/L   Chloride 100 98 - 111 mmol/L   CO2 29 22 - 32 mmol/L   Glucose, Bld 113 (H) 70 - 99 mg/dL    Comment: Glucose reference range applies only to samples taken after fasting for at least 8 hours.   BUN 21 8 - 23 mg/dL   Creatinine, Ser 0.87 0.44 - 1.00 mg/dL   Calcium 8.6 (L) 8.9 - 10.3 mg/dL   Total Protein 6.6 6.5 - 8.1 g/dL   Albumin 2.8 (L) 3.5 - 5.0 g/dL   AST 21 15 - 41 U/L   ALT 10 0 - 44 U/L   Alkaline Phosphatase 44 38 - 126 U/L   Total Bilirubin 0.7 0.3 - 1.2 mg/dL   GFR, Estimated >60 >60 mL/min    Comment: (NOTE) Calculated using the CKD-EPI Creatinine Equation (2021)    Anion gap 7 5 - 15    Comment: Performed at [facility], [address] [place], [place] [address]  CBC     Status: Abnormal   Collection Time: 11/17/21  1:09 AM  Result Value Ref Range   WBC 7.4 4.0 - 10.5 K/uL   RBC 3.51 (L) 3.87 - 5.11 MIL/uL   Hemoglobin 9.1 (L) 12.0 - 15.0 g/dL   HCT 29.4 (L) 36.0 - 46.0 %   MCV 83.8 80.0 - 100.0 fL   MCH 25.9 (L) 26.0 - 34.0 pg   MCHC 31.0 30.0 - 36.0 g/dL   RDW 22.8 (H) 11.5 - 15.5 %   Platelets 198 150 - 400 K/uL   nRBC 0.0 0.0 - 0.2 %    Comment: Performed at [facility], [address] [place], [place] [address]  Glucose, capillary     Status: Abnormal   Collection Time: 11/17/21  3:35 AM  Result Value Ref Range   Glucose-Capillary 130 (H) 70 - 99 mg/dL    Comment: Glucose reference range applies only to samples taken after fasting for at least 8 hours.   Comment 1 Notify RN    Comment 2 Document in Chart   Protime-INR     Status: Abnormal   Collection Time: 11/17/21  5:37 AM  Result Value Ref Range   Prothrombin Time 15.6 (H) 11.4 - 15.2 seconds   INR 1.3 (H) 0.8 - 1.2    Comment: (NOTE) INR goal varies based on device and disease states. Performed at [facility], [address] [place], [place] [address]   APTT     Status: Abnormal    Collection Time: 11/17/21  5:37 AM  Result Value Ref Range   aPTT 171 (HH) 24 - 36 seconds    Comment: RESULT REPEATED AND VERIFIED CRITICAL RESULT CALLED TO, READ BACK BY AND VERIFIED WITH: ASHLEY CRADDOCK AT 9735 11/17/21.PMF        IF BASELINE aPTT IS ELEVATED, SUGGEST PATIENT RISK ASSESSMENT BE USED TO DETERMINE APPROPRIATE ANTICOAGULANT THERAPY. Performed at [facility], [place], [place] [address]   Heparin level (unfractionated)     Status: None   Collection Time: 11/17/21  5:37 AM  Result Value Ref Range   Heparin Unfractionated 0.47 0.30 - 0.70 IU/mL    Comment: (NOTE) The clinical reportable range upper limit is being lowered to >1.10 to align with the FDA approved guidance for the  current laboratory assay.  If heparin results are below expected values, and patient dosage has  been confirmed, suggest follow up testing of antithrombin III levels. Performed at [facility], [address] [place], [place] [address]   Glucose, capillary     Status: Abnormal   Collection Time: 11/17/21  8:04 AM  Result Value Ref Range   Glucose-Capillary 134 (H) 70 - 99 mg/dL    Comment: Glucose reference range applies only to samples taken after fasting for at least 8 hours.  Heparin level (unfractionated)     Status: None   Collection Time: 11/17/21  9:50 AM  Result Value Ref Range   Heparin Unfractionated 0.45 0.30 - 0.70 IU/mL    Comment: (NOTE) The clinical reportable range upper limit is being lowered to >1.10 to align with the FDA approved guidance for the current laboratory assay.  If heparin results are below expected values, and patient dosage has  been confirmed, suggest follow up testing of antithrombin III levels. Performed at [facility], [address] [place], [place] [address]   APTT     Status: Abnormal   Collection Time: 11/17/21  9:50 AM  Result Value Ref Range   aPTT 98 (H) 24 - 36 seconds    Comment:         IF BASELINE aPTT IS ELEVATED, SUGGEST PATIENT RISK ASSESSMENT BE USED TO DETERMINE APPROPRIATE ANTICOAGULANT THERAPY. Performed at [facility], [address] [place], [place] [address]   Hemoglobin and hematocrit, blood     Status: Abnormal   Collection Time: 11/17/21  9:50 AM  Result Value Ref Range   Hemoglobin 8.4 (L) 12.0 - 15.0 g/dL   HCT 27.1 (L) 36.0 - 46.0 %    Comment: Performed at [facility], [address] [place], [place] [address]  Glucose, capillary     Status: Abnormal   Collection Time: 11/17/21 11:44 AM  Result Value Ref Range   Glucose-Capillary 132 (H) 70 - 99 mg/dL    Comment: Glucose reference range applies only to samples taken after fasting for at least 8 hours.  CBC     Status: Abnormal   Collection Time: 11/17/21 12:09 PM  Result Value Ref Range   WBC 4.7 4.0 - 10.5 K/uL   RBC 2.96 (L) 3.87 - 5.11 MIL/uL   Hemoglobin 7.7 (L) 12.0 - 15.0 g/dL   HCT 24.6 (L) 36.0 - 46.0 %   MCV 83.1 80.0 - 100.0 fL   MCH 26.0 26.0 - 34.0 pg   MCHC 31.3 30.0 - 36.0 g/dL   RDW 22.7 (H) 11.5 - 15.5 %   Platelets 177 150 - 400 K/uL   nRBC 0.0 0.0 - 0.2 %    Comment: Performed at [facility], [address] [place], [place] [address]  Prepare RBC (crossmatch)     Status: None   Collection Time: 11/17/21  1:35 PM  Result Value Ref Range   Order Confirmation      ORDER PROCESSED BY BLOOD BANK Performed at [facility], [address] [place], [place] [address]   Glucose, capillary     Status: None   Collection Time: 11/17/21  3:21 PM  Result Value Ref Range   Glucose-Capillary 98 70 - 99 mg/dL    Comment: Glucose reference range applies only to samples taken after fasting for at least 8 hours.  Heparin level (unfractionated)     Status: None   Collection Time: 11/17/21  4:00 PM  Result Value Ref Range   Heparin Unfractionated 0.36 0.30 - 0.70 IU/mL    Comment: (NOTE) The clinical reportable range upper limit is being  lowered to >1.10 to align with the FDA approved guidance for the current laboratory assay.  If heparin results are below expected values, and patient dosage has  been confirmed, suggest follow up testing of antithrombin III levels. Performed at [facility], [address] [place], [place] [address]   Hemoglobin and hematocrit, blood     Status: Abnormal   Collection Time: 11/17/21  6:56 PM  Result Value Ref Range   Hemoglobin 9.1 (L) 12.0 - 15.0 g/dL   HCT 28.7 (L) 36.0 - 46.0 %    Comment: Performed at [facility], [address] [place], [place] [address]  Glucose, capillary     Status: Abnormal   Collection Time: 11/17/21 10:54 PM  Result Value Ref Range   Glucose-Capillary 103 (H) 70 - 99 mg/dL    Comment: Glucose reference range applies only to samples taken after fasting for at least 8 hours.  CBC     Status: Abnormal   Collection Time: 11/18/21  5:34 AM  Result Value Ref Range   WBC 4.6 4.0 - 10.5 K/uL   RBC 3.37 (L) 3.87 - 5.11 MIL/uL   Hemoglobin 9.1 (L) 12.0 - 15.0 g/dL   HCT 28.7 (L) 36.0 - 46.0 %   MCV 85.2 80.0 - 100.0 fL   MCH 27.0 26.0 - 34.0 pg   MCHC 31.7 30.0 - 36.0 g/dL   RDW 22.1 (H) 11.5 - 15.5 %   Platelets 175 150 - 400 K/uL   nRBC 0.0 0.0 - 0.2 %    Comment: Performed at [facility], [address] [place], [place] [address]  Heparin level (unfractionated)     Status: Abnormal   Collection Time: 11/18/21  5:34 AM  Result Value Ref Range   Heparin Unfractionated <0.10 (L) 0.30 - 0.70 IU/mL    Comment: (NOTE) The clinical reportable range upper limit is being lowered to >1.10 to align with the FDA approved guidance for the current laboratory assay.  If heparin results are below expected values, and patient dosage has  been confirmed, suggest follow up testing of antithrombin III levels. Performed at [facility], [address] [place], [place] [address]   Basic metabolic panel     Status: Abnormal    Collection Time: 11/18/21  5:34 AM  Result Value Ref Range   Sodium 139 135 - 145 mmol/L   Potassium 3.9 3.5 - 5.1 mmol/L   Chloride 109 98 - 111 mmol/L   CO2 25 22 - 32 mmol/L   Glucose, Bld 112 (H) 70 - 99 mg/dL    Comment: Glucose reference range applies only to samples taken after fasting for at least 8 hours.   BUN 19 8 - 23 mg/dL   Creatinine, Ser 0.74 0.44 - 1.00 mg/dL   Calcium 8.4 (L) 8.9 - 10.3 mg/dL   GFR, Estimated >60 >60 mL/min    Comment: (NOTE) Calculated using the CKD-EPI Creatinine Equation (2021)    Anion gap 5 5 - 15    Comment: Performed at [facility], [address] [place], [place] [address]  Glucose, capillary     Status: Abnormal   Collection Time: 11/18/21  7:11 AM  Result Value Ref Range   Glucose-Capillary 118 (H) 70 - 99 mg/dL    Comment: Glucose reference range applies only to samples  taken after fasting for at least 8 hours.  Glucose, capillary     Status: Abnormal   Collection Time: 11/18/21 11:58 AM  Result Value Ref Range   Glucose-Capillary 151 (H) 70 - 99 mg/dL    Comment: Glucose reference range applies only to samples taken after fasting for at least 8 hours.  Glucose, capillary     Status: None   Collection Time: 11/18/21  4:42 PM  Result Value Ref Range   Glucose-Capillary 93 70 - 99 mg/dL    Comment: Glucose reference range applies only to samples taken after fasting for at least 8 hours.  Hemoglobin and hematocrit, blood     Status: Abnormal   Collection Time: 11/18/21  5:07 PM  Result Value Ref Range   Hemoglobin 10.0 (L) 12.0 - 15.0 g/dL   HCT 32.5 (L) 36.0 - 46.0 %    Comment: Performed at [facility], [address] [place], [place] [address]  Glucose, capillary     Status: Abnormal   Collection Time: 11/18/21  8:53 PM  Result Value Ref Range   Glucose-Capillary 120 (H) 70 - 99 mg/dL    Comment: Glucose reference range applies only to samples taken after fasting for at least 8 hours.  CBC     Status:  Abnormal   Collection Time: 11/19/21  3:38 AM  Result Value Ref Range   WBC 4.9 4.0 - 10.5 K/uL   RBC 3.37 (L) 3.87 - 5.11 MIL/uL   Hemoglobin 9.0 (L) 12.0 - 15.0 g/dL   HCT 28.8 (L) 36.0 - 46.0 %   MCV 85.5 80.0 - 100.0 fL   MCH 26.7 26.0 - 34.0 pg   MCHC 31.3 30.0 - 36.0 g/dL   RDW 22.6 (H) 11.5 - 15.5 %   Platelets 189 150 - 400 K/uL   nRBC 0.0 0.0 - 0.2 %    Comment: Performed at [facility], [address] [place], [place] [address]  Basic metabolic panel     Status: Abnormal   Collection Time: 11/19/21  3:38 AM  Result Value Ref Range   Sodium 139 135 - 145 mmol/L   Potassium 4.0 3.5 - 5.1 mmol/L   Chloride 107 98 - 111 mmol/L   CO2 26 22 - 32 mmol/L   Glucose, Bld 111 (H) 70 - 99 mg/dL    Comment: Glucose reference range applies only to samples taken after fasting for at least 8 hours.   BUN 21 8 - 23 mg/dL   Creatinine, Ser 0.81 0.44 - 1.00 mg/dL   Calcium 8.6 (L) 8.9 - 10.3 mg/dL   GFR, Estimated >60 >60 mL/min    Comment: (NOTE) Calculated using the CKD-EPI Creatinine Equation (2021)    Anion gap 6 5 - 15    Comment: Performed at [facility], [address] [place], [place] [address]  Ferritin     Status: None   Collection Time: 11/19/21  3:38 AM  Result Value Ref Range   Ferritin 14 11 - 307 ng/mL    Comment: Performed at [facility], [address] [place], [place] [address]  Iron and TIBC     Status: None   Collection Time: 11/19/21  3:38 AM  Result Value Ref Range   Iron 31 28 - 170 ug/dL   TIBC 253 250 - 450 ug/dL   Saturation Ratios 12 10.4 - 31.8 %   UIBC 222 ug/dL    Comment: Performed at [facility], [address] [place], [place] [address]  Reticulocytes     Status: Abnormal   Collection Time: 11/19/21  3:38 AM  Result Value Ref Range   Retic Ct Pct 2.6 0.4 - 3.1 %   RBC. 3.37 (L) 3.87 - 5.11 MIL/uL   Retic Count, Absolute 87.6 19.0 - 186.0 K/uL   Immature Retic Fract 25.5 (H) 2.3 - 15.9 %     Comment: Performed at [facility], [address] [place], [place] [address]  Glucose, capillary     Status: Abnormal   Collection Time: 11/19/21  7:39 AM  Result Value Ref Range   Glucose-Capillary 110 (H) 70 - 99 mg/dL    Comment: Glucose reference range applies only to samples taken after fasting for at least 8 hours.  CBC with Differential/Platelet     Status: Abnormal   Collection Time: 11/26/21  1:59 PM  Result Value Ref Range   WBC 5.7 4.0 - 10.5 K/uL   RBC 3.09 (L) 3.87 - 5.11 MIL/uL   Hemoglobin 8.5 (L) 12.0 - 15.0 g/dL   HCT 28.3 (L) 36.0 - 46.0 %   MCV 91.6 80.0 - 100.0 fL   MCH 27.5 26.0 - 34.0 pg   MCHC 30.0 30.0 - 36.0 g/dL   RDW 23.3 (H) 11.5 - 15.5 %   Platelets 248 150 - 400 K/uL   nRBC 0.0 0.0 - 0.2 %   Neutrophils Relative % 74 %   Neutro Abs 4.2 1.7 - 7.7 K/uL   Lymphocytes Relative 15 %   Lymphs Abs 0.9 0.7 - 4.0 K/uL   Monocytes Relative 6 %   Monocytes Absolute 0.3 0.1 - 1.0 K/uL   Eosinophils Relative 4 %   Eosinophils Absolute 0.2 0.0 - 0.5 K/uL   Basophils Relative 1 %   Basophils Absolute 0.0 0.0 - 0.1 K/uL   Immature Granulocytes 0 %   Abs Immature Granulocytes 0.02 0.00 - 0.07 K/uL    Comment: Performed at [facility], [address] [place], [place] [address]  Iron and TIBC([facility])     Status: Abnormal   Collection Time: 11/26/21  1:59 PM  Result Value Ref Range   Iron 31 28 - 170 ug/dL   TIBC 337 250 - 450 ug/dL   Saturation Ratios 9 (L) 10.4 - 31.8 %   UIBC 306 ug/dL    Comment: Performed at [facility], [address] [place], [place] [address]  Magnesium     Status: None   Collection Time: 11/26/21  1:59 PM  Result Value Ref Range   Magnesium 2.1 1.7 - 2.4 mg/dL    Comment: Performed at [facility], [address] [place], [place] [address]  Ferritin     Status: None   Collection Time: 11/26/21  1:59 PM  Result Value Ref Range   Ferritin 18 11 - 307 ng/mL    Comment: Performed at  [facility], [address] [place], [place] [address]  Comprehensive metabolic panel     Status: Abnormal   Collection Time: 11/26/21  1:59 PM  Result Value Ref Range   Sodium 137 135 - 145 mmol/L   Potassium 3.6 3.5 - 5.1 mmol/L   Chloride 101 98 - 111 mmol/L   CO2 27 22 - 32 mmol/L   Glucose, Bld 113 (H) 70 - 99 mg/dL    Comment: Glucose reference range applies only to samples taken after fasting for at least 8 hours.   BUN 22 8 - 23 mg/dL  Creatinine, Ser 1.08 (H) 0.44 - 1.00 mg/dL   Calcium 8.9 8.9 - 10.3 mg/dL   Total Protein 7.2 6.5 - 8.1 g/dL   Albumin 3.4 (L) 3.5 - 5.0 g/dL   AST 22 15 - 41 U/L   ALT 12 0 - 44 U/L   Alkaline Phosphatase 44 38 - 126 U/L   Total Bilirubin 0.9 0.3 - 1.2 mg/dL   GFR, Estimated 57 (L) >60 mL/min    Comment: (NOTE) Calculated using the CKD-EPI Creatinine Equation (2021)    Anion gap 9 5 - 15    Comment: Performed at [facility], [address] [place], [place] [address]  Urinalysis, Complete     Status: Abnormal   Collection Time: 12/14/21  8:54 AM  Result Value Ref Range   Specific Gravity, UA 1.015 1.005 - 1.030   pH, UA 6.0 5.0 - 7.5   Color, UA Yellow Yellow   Appearance Ur Hazy (A) Clear   Leukocytes,UA 3+ (A) Negative   Protein,UA Negative Negative/Trace   Glucose, UA Negative Negative   Ketones, UA Negative Negative   RBC, UA 3+ (A) Negative   Bilirubin, UA Negative Negative   Urobilinogen, Ur 0.2 0.2 - 1.0 mg/dL   Nitrite, UA Positive (A) Negative   Microscopic Examination See below:   Microscopic Examination     Status: Abnormal   Collection Time: 12/14/21  8:54 AM   Urine  Result Value Ref Range   WBC, UA >30 (A) 0 - 5 /hpf   RBC, Urine 11-30 (A) 0 - 2 /hpf   Epithelial Cells (non renal) 0-10 0 - 10 /hpf   Renal Epithel, UA 0-10 (A) None seen /hpf   Bacteria, UA Moderate (A) None seen/Few  CBC with Differential     Status: Abnormal   Collection Time: 12/24/21 12:32 PM  Result Value Ref Range    WBC 3.8 (L) 4.0 - 10.5 K/uL   RBC 2.91 (L) 3.87 - 5.11 MIL/uL   Hemoglobin 8.0 (L) 12.0 - 15.0 g/dL   HCT 27.7 (L) 36.0 - 46.0 %   MCV 95.2 80.0 - 100.0 fL   MCH 27.5 26.0 - 34.0 pg   MCHC 28.9 (L) 30.0 - 36.0 g/dL   RDW 18.6 (H) 11.5 - 15.5 %   Platelets 249 150 - 400 K/uL   nRBC 0.0 0.0 - 0.2 %   Neutrophils Relative % 69 %   Neutro Abs 2.6 1.7 - 7.7 K/uL   Lymphocytes Relative 20 %   Lymphs Abs 0.8 0.7 - 4.0 K/uL   Monocytes Relative 6 %   Monocytes Absolute 0.2 0.1 - 1.0 K/uL   Eosinophils Relative 4 %   Eosinophils Absolute 0.2 0.0 - 0.5 K/uL   Basophils Relative 1 %   Basophils Absolute 0.0 0.0 - 0.1 K/uL   Immature Granulocytes 0 %   Abs Immature Granulocytes 0.01 0.00 - 0.07 K/uL    Comment: Performed at [facility], [address] [place], [place] [address]  Comprehensive metabolic panel     Status: Abnormal   Collection Time: 12/24/21 12:32 PM  Result Value Ref Range   Sodium 137 135 - 145 mmol/L   Potassium 3.8 3.5 - 5.1 mmol/L   Chloride 104 98 - 111 mmol/L   CO2 27 22 - 32 mmol/L   Glucose, Bld 108 (H) 70 - 99 mg/dL    Comment: Glucose reference range applies only to samples taken after fasting for at least 8 hours.  BUN 18 8 - 23 mg/dL   Creatinine, Ser 0.73 0.44 - 1.00 mg/dL   Calcium 9.0 8.9 - 10.3 mg/dL   Total Protein 7.3 6.5 - 8.1 g/dL   Albumin 3.3 (L) 3.5 - 5.0 g/dL   AST 16 15 - 41 U/L   ALT 8 0 - 44 U/L   Alkaline Phosphatase 45 38 - 126 U/L   Total Bilirubin 1.3 (H) 0.3 - 1.2 mg/dL   GFR, Estimated >60 >60 mL/min    Comment: (NOTE) Calculated using the CKD-EPI Creatinine Equation (2021)    Anion gap 6 5 - 15    Comment: Performed at [facility], [address] [place], [place] [address]  Urinalysis, Routine w reflex microscopic Urine, Clean Catch     Status: Abnormal   Collection Time: 12/24/21  5:53 PM  Result Value Ref Range   Color, Urine RED (A) YELLOW   APPearance TURBID (A) CLEAR   Specific Gravity, Urine 1.018  1.005 - 1.030   pH  5.0 - 8.0    TEST NOT REPORTED DUE TO COLOR INTERFERENCE OF URINE PIGMENT   Glucose, UA (A) NEGATIVE mg/dL    TEST NOT REPORTED DUE TO COLOR INTERFERENCE OF URINE PIGMENT   Hgb urine dipstick (A) NEGATIVE    TEST NOT REPORTED DUE TO COLOR INTERFERENCE OF URINE PIGMENT   Bilirubin Urine (A) NEGATIVE    TEST NOT REPORTED DUE TO COLOR INTERFERENCE OF URINE PIGMENT   Ketones, ur (A) NEGATIVE mg/dL    TEST NOT REPORTED DUE TO COLOR INTERFERENCE OF URINE PIGMENT   Protein, ur (A) NEGATIVE mg/dL    TEST NOT REPORTED DUE TO COLOR INTERFERENCE OF URINE PIGMENT   Nitrite (A) NEGATIVE    TEST NOT REPORTED DUE TO COLOR INTERFERENCE OF URINE PIGMENT   Leukocytes,Ua (A) NEGATIVE    TEST NOT REPORTED DUE TO COLOR INTERFERENCE OF URINE PIGMENT   RBC / HPF >50 (H) 0 - 5 RBC/hpf   WBC, UA >50 (H) 0 - 5 WBC/hpf   Bacteria, UA FEW (A) NONE SEEN   Squamous Epithelial / LPF NONE SEEN 0 - 5   WBC Clumps PRESENT    Non Squamous Epithelial PRESENT (A) NONE SEEN    Comment: Performed at [facility], [address], [place], [place] [address]  Urine Culture     Status: Abnormal   Collection Time: 12/24/21  5:53 PM   Specimen: Urine, Clean Catch  Result Value Ref Range   Specimen Description      URINE, CLEAN CATCH Performed at [facility], [address], [place],  [address]    Special Requests      NONE Performed at [facility], [address] [place],  [address]    Culture 30,000 COLONIES/mL ENTEROCOCCUS FAECIUM (A)    Report Status 12/27/2021 FINAL    Organism ID, Bacteria ENTEROCOCCUS FAECIUM (A)       Susceptibility   Enterococcus faecium - MIC*    AMPICILLIN >=32 RESISTANT Resistant     NITROFURANTOIN 128 RESISTANT Resistant     VANCOMYCIN <=0.5 SENSITIVE Sensitive     * 30,000 COLONIES/mL ENTEROCOCCUS FAECIUM  Type and screen     Status: None   Collection Time: 12/25/21 12:18 AM  Result Value Ref Range   ABO/RH(D) A POS     Antibody Screen POS    Sample Expiration 12/28/2021,2359    Antibody Identification ANTI E    Unit Number [id number]    Blood Component  Type RED CELLS,LR    Unit division 00    Status of Unit REL FROM [facility]    Transfusion Status OK TO TRANSFUSE    Crossmatch Result COMPATIBLE    Unit Number [id number]    Blood Component Type RED CELLS,LR    Unit division 00    Status of Unit ISSUED,FINAL    Transfusion Status OK TO TRANSFUSE    Crossmatch Result COMPATIBLE    Donor AG Type NEGATIVE FOR E ANTIGEN NEGATIVE FOR c ANTIGEN    Unit Number [id number]    Blood Component Type RED CELLS,LR    Unit division 00    Status of Unit REL FROM [facility]    Transfusion Status OK TO TRANSFUSE    Crossmatch Result COMPATIBLE   BPAM RBC     Status: None   Collection Time: 12/25/21 12:18 AM  Result Value Ref Range   Blood Product Unit Number [id number]    PRODUCT CODE E0382V00    Unit Type and Rh 6200    Blood Product Expiration Date 528413244010    ISSUE DATE / TIME [id number]    Blood Product Unit Number [id number]    PRODUCT CODE E0382V00    Unit Type and Rh 6200    Blood Product Expiration Date 332951884166    Blood Product Unit Number [id number]    PRODUCT CODE F5732K02    Unit Type and Rh 6200    Blood Product Expiration Date 542706237628   Basic metabolic panel     Status: Abnormal   Collection Time: 12/25/21  3:16 AM  Result Value Ref Range   Sodium 140 135 - 145 mmol/L   Potassium 3.5 3.5 - 5.1 mmol/L   Chloride 109 98 - 111 mmol/L   CO2 27 22 - 32 mmol/L   Glucose, Bld 124 (H) 70 - 99 mg/dL    Comment: Glucose reference range applies only to samples taken after fasting for at least 8 hours.   BUN 17 8 - 23 mg/dL   Creatinine, Ser 0.62 0.44 - 1.00 mg/dL   Calcium 8.7 (L) 8.9 - 10.3 mg/dL   GFR, Estimated >60 >60 mL/min    Comment: (NOTE) Calculated using the CKD-EPI Creatinine Equation (2021)    Anion gap 4 (L) 5 - 15    Comment: Performed at [facility], [address] [place], [place] [address]  CBC     Status: Abnormal   Collection Time: 12/25/21  3:16 AM  Result Value Ref Range   WBC 6.1 4.0 - 10.5 K/uL   RBC 2.67 (L) 3.87 - 5.11 MIL/uL   Hemoglobin 7.4 (L) 12.0 - 15.0 g/dL   HCT 25.3 (L) 36.0 - 46.0 %   MCV 94.8 80.0 - 100.0 fL   MCH 27.7 26.0 - 34.0 pg   MCHC 29.2 (L) 30.0 - 36.0 g/dL   RDW 18.4 (H) 11.5 - 15.5 %   Platelets 216 150 - 400 K/uL   nRBC 0.0 0.0 - 0.2 %    Comment: Performed at [facility], [address] [place], [place] [address]  Basic metabolic panel     Status: Abnormal   Collection Time: 12/26/21  3:23 AM  Result Value Ref Range   Sodium 140 135 - 145 mmol/L   Potassium 3.7 3.5 - 5.1 mmol/L   Chloride 110 98 - 111 mmol/L   CO2 27 22 - 32 mmol/L   Glucose, Bld 119 (H) 70 - 99 mg/dL    Comment: Glucose reference range  applies only to samples taken after fasting for at least 8 hours.   BUN 15 8 - 23 mg/dL   Creatinine, Ser 0.69 0.44 - 1.00 mg/dL   Calcium 8.6 (L) 8.9 - 10.3 mg/dL   GFR, Estimated >60 >60 mL/min    Comment: (NOTE) Calculated using the CKD-EPI Creatinine Equation (2021)    Anion gap 3 (L) 5 - 15    Comment: Performed at [facility], [address] [place], [place] [address]  CBC     Status: Abnormal   Collection Time: 12/26/21  3:23 AM  Result Value Ref Range   WBC 4.5 4.0 - 10.5 K/uL   RBC 2.55 (L) 3.87 - 5.11 MIL/uL   Hemoglobin 6.9 (L) 12.0 - 15.0 g/dL   HCT 23.9 (L) 36.0 - 46.0 %   MCV 93.7 80.0 - 100.0 fL   MCH 27.1 26.0 - 34.0 pg   MCHC 28.9 (L) 30.0 - 36.0 g/dL   RDW 18.0 (H) 11.5 - 15.5 %   Platelets 192 150 - 400 K/uL   nRBC 0.0 0.0 - 0.2 %    Comment: Performed at [facility], [address] [place], [place] [address]  Magnesium     Status: None   Collection Time: 12/26/21  3:23 AM  Result Value Ref Range   Magnesium 2.1 1.7 - 2.4 mg/dL    Comment: Performed at [facility], [address] [place],  [place] [address]  Prepare RBC (crossmatch)     Status: None   Collection Time: 12/26/21 10:30 AM  Result Value Ref Range   Order Confirmation      ORDER PROCESSED BY BLOOD BANK Performed at [facility], [address] [place], [place] [address]   Hemoglobin and hematocrit, blood     Status: Abnormal   Collection Time: 12/26/21  3:13 PM  Result Value Ref Range   Hemoglobin 8.7 (L) 12.0 - 15.0 g/dL   HCT 28.7 (L) 36.0 - 46.0 %    Comment: Performed at [facility], [address] [place], [place] [address]  Basic metabolic panel     Status: Abnormal   Collection Time: 12/27/21  3:57 AM  Result Value Ref Range   Sodium 139 135 - 145 mmol/L   Potassium 3.9 3.5 - 5.1 mmol/L   Chloride 111 98 - 111 mmol/L   CO2 25 22 - 32 mmol/L   Glucose, Bld 112 (H) 70 - 99 mg/dL    Comment: Glucose reference range applies only to samples taken after fasting for at least 8 hours.   BUN 15 8 - 23 mg/dL   Creatinine, Ser 0.67 0.44 - 1.00 mg/dL   Calcium 8.5 (L) 8.9 - 10.3 mg/dL   GFR, Estimated >60 >60 mL/min    Comment: (NOTE) Calculated using the CKD-EPI Creatinine Equation (2021)    Anion gap 3 (L) 5 - 15    Comment: Performed at [facility], [address] [place], [place] [address]  CBC     Status: Abnormal   Collection Time: 12/27/21  3:57 AM  Result Value Ref Range   WBC 4.7 4.0 - 10.5 K/uL   RBC 2.76 (L) 3.87 - 5.11 MIL/uL   Hemoglobin 7.6 (L) 12.0 - 15.0 g/dL   HCT 24.9 (L) 36.0 - 46.0 %   MCV 90.2 80.0 - 100.0 fL   MCH 27.5 26.0 - 34.0 pg   MCHC 30.5 30.0 - 36.0 g/dL   RDW 18.7 (H) 11.5 -  15.5 %   Platelets 220 150 - 400 K/uL   nRBC 0.0 0.0 - 0.2 %    Comment: Performed at [facility], [address] [place], [place] [address]  Magnesium     Status: None   Collection Time: 12/27/21  3:57 AM  Result Value Ref Range   Magnesium 2.0 1.7 - 2.4 mg/dL    Comment: Performed at [facility], [address] [place], [place] [address]   Protime-INR     Status: None   Collection Time: 12/27/21  9:30 AM  Result Value Ref Range   Prothrombin Time 14.4 11.4 - 15.2 seconds   INR 1.1 0.8 - 1.2    Comment: (NOTE) INR goal varies based on device and disease states. Performed at [facility], [address] [place], [place] [address]   APTT     Status: Abnormal   Collection Time: 12/27/21  9:30 AM  Result Value Ref Range   aPTT 37 (H) 24 - 36 seconds    Comment:        IF BASELINE aPTT IS ELEVATED, SUGGEST PATIENT RISK ASSESSMENT BE USED TO DETERMINE APPROPRIATE ANTICOAGULANT THERAPY. Performed at [facility], [address] [place], [place] [address]   Urine Culture     Status: Abnormal   Collection Time: 12/27/21  2:00 PM   Specimen: Urine, Random  Result Value Ref Range   Specimen Description URINE, RANDOM    Special Requests      NEPHROSTOMY LEFT Performed at [facility], [place] [address] [place], [place] [address]    Culture (A)     >=100,000 COLONIES/mL ENTEROCOCCUS FAECIUM >=100,000 COLONIES/mL YEAST    Report Status 12/31/2021 FINAL    Organism ID, Bacteria ENTEROCOCCUS FAECIUM (A)       Susceptibility   Enterococcus faecium - MIC*    AMPICILLIN >=32 RESISTANT Resistant     NITROFURANTOIN 256 RESISTANT Resistant     VANCOMYCIN <=0.5 SENSITIVE Sensitive     * >=100,000 COLONIES/mL ENTEROCOCCUS FAECIUM  Basic metabolic panel     Status: Abnormal   Collection Time: 12/28/21  4:00 AM  Result Value Ref Range   Sodium 140 135 - 145 mmol/L   Potassium 4.3 3.5 - 5.1 mmol/L   Chloride 111 98 - 111 mmol/L   CO2 25 22 - 32 mmol/L   Glucose, Bld 113 (H) 70 - 99 mg/dL    Comment: Glucose reference range applies only to samples taken after fasting for at least 8 hours.   BUN 14 8 - 23 mg/dL   Creatinine, Ser 0.76 0.44 - 1.00 mg/dL   Calcium 8.4 (L) 8.9 - 10.3 mg/dL   GFR, Estimated >60 >60 mL/min    Comment: (NOTE) Calculated using the CKD-EPI Creatinine Equation (2021)     Anion gap 4 (L) 5 - 15    Comment: Performed at [facility], [address] [place], [place] [address]  CBC     Status: Abnormal   Collection Time: 12/28/21  4:00 AM  Result Value Ref Range   WBC 6.0 4.0 - 10.5 K/uL   RBC 2.96 (L) 3.87 - 5.11 MIL/uL   Hemoglobin 8.2 (L) 12.0 - 15.0 g/dL   HCT 26.9 (L) 36.0 - 46.0 %   MCV 90.9 80.0 - 100.0 fL   MCH 27.7 26.0 - 34.0 pg   MCHC 30.5 30.0 - 36.0 g/dL   RDW 18.3 (H) 11.5 - 15.5 %   Platelets 218 150 -  400 K/uL   nRBC 0.0 0.0 - 0.2 %    Comment: Performed at [facility] [place], [address] [place], [place] [address]  Magnesium     Status: None   Collection Time: 12/28/21  4:00 AM  Result Value Ref Range   Magnesium 2.1 1.7 - 2.4 mg/dL    Comment: Performed at [facility], [place] [place], [place] [address]  Basic metabolic panel     Status: Abnormal   Collection Time: 12/29/21  4:36 AM  Result Value Ref Range   Sodium 136 135 - 145 mmol/L   Potassium 4.2 3.5 - 5.1 mmol/L   Chloride 107 98 - 111 mmol/L   CO2 25 22 - 32 mmol/L   Glucose, Bld 104 (H) 70 - 99 mg/dL    Comment: Glucose reference range applies only to samples taken after fasting for at least 8 hours.   BUN 14 8 - 23 mg/dL   Creatinine, Ser 0.71 0.44 - 1.00 mg/dL   Calcium 8.6 (L) 8.9 - 10.3 mg/dL   GFR, Estimated >60 >60 mL/min    Comment: (NOTE) Calculated using the CKD-EPI Creatinine Equation (2021)    Anion gap 4 (L) 5 - 15    Comment: Performed at [facility], [address] [place], [place] [address]  CBC     Status: Abnormal   Collection Time: 12/29/21  4:36 AM  Result Value Ref Range   WBC 3.8 (L) 4.0 - 10.5 K/uL   RBC 3.14 (L) 3.87 - 5.11 MIL/uL   Hemoglobin 8.7 (L) 12.0 - 15.0 g/dL   HCT 29.2 (L) 36.0 - 46.0 %   MCV 93.0 80.0 - 100.0 fL   MCH 27.7 26.0 - 34.0 pg   MCHC 29.8 (L) 30.0 - 36.0 g/dL   RDW 17.6 (H) 11.5 - 15.5 %   Platelets 207 150 - 400 K/uL   nRBC 0.0 0.0 - 0.2 %    Comment: Performed at  [facility], [address] [place], [place] [address]  Magnesium     Status: None   Collection Time: 12/29/21  4:36 AM  Result Value Ref Range   Magnesium 2.0 1.7 - 2.4 mg/dL    Comment: Performed at [facility], [address] [place], [place] [address]  Basic metabolic panel     Status: Abnormal   Collection Time: 12/30/21  4:33 AM  Result Value Ref Range   Sodium 137 135 - 145 mmol/L   Potassium 4.1 3.5 - 5.1 mmol/L   Chloride 110 98 - 111 mmol/L   CO2 24 22 - 32 mmol/L   Glucose, Bld 135 (H) 70 - 99 mg/dL    Comment: Glucose reference range applies only to samples taken after fasting for at least 8 hours.   BUN 14 8 - 23 mg/dL   Creatinine, Ser 0.66 0.44 - 1.00 mg/dL   Calcium 8.5 (L) 8.9 - 10.3 mg/dL   GFR, Estimated >60 >60 mL/min    Comment: (NOTE) Calculated using the CKD-EPI Creatinine Equation (2021)    Anion gap 3 (L) 5 - 15    Comment: Performed at [facility], [address] [place],  [address]  CBC     Status: Abnormal   Collection Time: 12/30/21  4:33 AM  Result Value Ref Range   WBC 4.5 4.0 - 10.5 K/uL   RBC 2.90 (L) 3.87 - 5.11 MIL/uL   Hemoglobin 8.1 (L) 12.0 - 15.0 g/dL   HCT  26.5 (L) 36.0 - 46.0 %   MCV 91.4 80.0 - 100.0 fL   MCH 27.9 26.0 - 34.0 pg   MCHC 30.6 30.0 - 36.0 g/dL   RDW 17.2 (H) 11.5 - 15.5 %   Platelets 223 150 - 400 K/uL   nRBC 0.0 0.0 - 0.2 %    Comment: Performed at [facility], [address] [place], [place] [address]  Magnesium     Status: None   Collection Time: 12/30/21  4:33 AM  Result Value Ref Range   Magnesium 1.9 1.7 - 2.4 mg/dL    Comment: Performed at [facility], [address] [place], [place] [address]  Basic metabolic panel     Status: Abnormal   Collection Time: 12/31/21  4:18 AM  Result Value Ref Range   Sodium 139 135 - 145 mmol/L   Potassium 4.0 3.5 - 5.1 mmol/L   Chloride 109 98 - 111 mmol/L   CO2 26 22 - 32 mmol/L   Glucose, Bld 117 (H) 70 -  99 mg/dL    Comment: Glucose reference range applies only to samples taken after fasting for at least 8 hours.   BUN 14 8 - 23 mg/dL   Creatinine, Ser 0.64 0.44 - 1.00 mg/dL   Calcium 8.6 (L) 8.9 - 10.3 mg/dL   GFR, Estimated >60 >60 mL/min    Comment: (NOTE) Calculated using the CKD-EPI Creatinine Equation (2021)    Anion gap 4 (L) 5 - 15    Comment: Performed at [facility], [address] [place], [place] [address]  CBC     Status: Abnormal   Collection Time: 12/31/21  4:18 AM  Result Value Ref Range   WBC 4.1 4.0 - 10.5 K/uL   RBC 2.88 (L) 3.87 - 5.11 MIL/uL   Hemoglobin 7.8 (L) 12.0 - 15.0 g/dL   HCT 26.3 (L) 36.0 - 46.0 %   MCV 91.3 80.0 - 100.0 fL   MCH 27.1 26.0 - 34.0 pg   MCHC 29.7 (L) 30.0 - 36.0 g/dL   RDW 17.0 (H) 11.5 - 15.5 %   Platelets 209 150 - 400 K/uL   nRBC 0.0 0.0 - 0.2 %    Comment: Performed at [facility], [address] [place], [place] [address]  Magnesium     Status: None   Collection Time: 12/31/21  4:18 AM  Result Value Ref Range   Magnesium 1.9 1.7 - 2.4 mg/dL    Comment: Performed at [facility], [address] [place], [place] [address]  Urinalysis, Complete     Status: Abnormal   Collection Time: 01/03/22  2:43 PM  Result Value Ref Range   Specific Gravity, UA 1.025 1.005 - 1.030   pH, UA 7.0 5.0 - 7.5   Color, UA Yellow Yellow   Appearance Ur Cloudy (A) Clear   Leukocytes,UA 3+ (A) Negative   Protein,UA 3+ (A) Negative/Trace   Glucose, UA Negative Negative   Ketones, UA Negative Negative   RBC, UA 3+ (A) Negative   Bilirubin, UA Negative Negative   Urobilinogen, Ur 0.2 0.2 - 1.0 mg/dL   Nitrite, UA Negative Negative   Microscopic Examination See below:   Microscopic Examination     Status: Abnormal   Collection Time: 01/03/22  2:43 PM   Urine  Result Value Ref Range   WBC, UA >30 (A) 0 - 5 /hpf   RBC, Urine >30 (A) 0 - 2 /hpf   Epithelial Cells (non  renal) 0-10 0 - 10 /hpf   Bacteria, UA  Moderate (A) None seen/Few  Basic metabolic panel     Status: Abnormal   Collection Time: 01/29/22  7:30 PM  Result Value Ref Range   Sodium 136 135 - 145 mmol/L   Potassium 3.5 3.5 - 5.1 mmol/L   Chloride 104 98 - 111 mmol/L   CO2 24 22 - 32 mmol/L   Glucose, Bld 115 (H) 70 - 99 mg/dL    Comment: Glucose reference range applies only to samples taken after fasting for at least 8 hours.   BUN 29 (H) 8 - 23 mg/dL   Creatinine, Ser 0.90 0.44 - 1.00 mg/dL   Calcium 9.4 8.9 - 10.3 mg/dL   GFR, Estimated >60 >60 mL/min    Comment: (NOTE) Calculated using the CKD-EPI Creatinine Equation (2021)    Anion gap 8 5 - 15    Comment: Performed at [facility], [address] [place], [place] [address]  CBC     Status: Abnormal   Collection Time: 01/29/22  7:30 PM  Result Value Ref Range   WBC 5.6 4.0 - 10.5 K/uL   RBC 2.00 (L) 3.87 - 5.11 MIL/uL   Hemoglobin 4.7 (LL) 12.0 - 15.0 g/dL    Comment: This critical result has verified and been called to [facility] by [facility] on 07 25 2023 at [place], and has been read back.    HCT 16.2 (L) 36.0 - 46.0 %   MCV 81.0 80.0 - 100.0 fL   MCH 23.5 (L) 26.0 - 34.0 pg   MCHC 29.0 (L) 30.0 - 36.0 g/dL   RDW 18.5 (H) 11.5 - 15.5 %   Platelets 246 150 - 400 K/uL   nRBC 0.0 0.0 - 0.2 %    Comment: Performed at [facility], [place], [place] [address]  Troponin I (High Sensitivity)     Status: None   Collection Time: 01/29/22  7:30 PM  Result Value Ref Range   Troponin I (High Sensitivity) 5 <18 ng/L    Comment: (NOTE) Elevated high sensitivity troponin I (hsTnI) values and significant  changes across serial measurements may suggest ACS but many other  chronic and acute conditions are known to elevate hsTnI results.  Refer to the "Links" section for chest pain algorithms and additional  guidance. Performed at [facility], [address] [place], [place] [address]   D-dimer, quantitative      Status: None   Collection Time: 01/29/22  7:30 PM  Result Value Ref Range   D-Dimer, Quant 0.50 0.00 - 0.50 ug/mL-FEU    Comment: (NOTE) At the manufacturer cut-off value of 0.5 g/mL FEU, this assay has a negative predictive value of 95-100%.This assay is intended for use in conjunction with a clinical pretest probability (PTP) assessment model to exclude pulmonary embolism (PE) and deep venous thrombosis (DVT) in outpatients suspected of PE or DVT. Results should be correlated with clinical presentation. Performed at [facility], [address] [place], [place] [address]   Brain natriuretic peptide     Status: None   Collection Time: 01/29/22  7:30 PM  Result Value Ref Range   B Natriuretic Peptide 66.2 0.0 - 100.0 pg/mL    Comment: Performed at [facility], [address] [place],  [address]  Prepare RBC (crossmatch)     Status: None   Collection Time: 01/29/22  8:02 PM  Result Value Ref Range   Order  Confirmation      ORDER PROCESSED BY BLOOD BANK Performed at [facility], [address] [place], [place] [address]   Urinalysis, Routine w reflex microscopic     Status: Abnormal   Collection Time: 01/29/22  8:56 PM  Result Value Ref Range   Color, Urine YELLOW (A) YELLOW   APPearance CLOUDY (A) CLEAR   Specific Gravity, Urine 1.015 1.005 - 1.030   pH 5.0 5.0 - 8.0   Glucose, UA NEGATIVE NEGATIVE mg/dL   Hgb urine dipstick NEGATIVE NEGATIVE   Bilirubin Urine NEGATIVE NEGATIVE   Ketones, ur NEGATIVE NEGATIVE mg/dL   Protein, ur 100 (A) NEGATIVE mg/dL   Nitrite NEGATIVE NEGATIVE   Leukocytes,Ua LARGE (A) NEGATIVE   RBC / HPF 0-5 0 - 5 RBC/hpf   WBC, UA >50 (H) 0 - 5 WBC/hpf   Bacteria, UA RARE (A) NONE SEEN   Squamous Epithelial / LPF 0-5 0 - 5   WBC Clumps PRESENT    Mucus PRESENT    Hyaline Casts, UA PRESENT     Comment: Performed at [facility], [address] [place], [place] [address]  Type and screen [place]     Status: None   Collection Time: 01/29/22  8:56 PM  Result Value Ref Range   ABO/RH(D) A POS    Antibody Screen POS    Sample Expiration 02/01/2022,2359    Antibody Identification ANTI E    Unit Number [id number]    Blood Component Type RED CELLS,LR    Unit division 00    Status of Unit REL FROM [facility]    Transfusion Status OK TO TRANSFUSE    Crossmatch Result COMPATIBLE    Unit Number [id number]    Blood Component Type RED CELLS,LR    Unit division 00    Status of Unit ISSUED,FINAL    Transfusion Status OK TO TRANSFUSE    Crossmatch Result COMPATIBLE    Donor AG Type NEGATIVE FOR E ANTIGEN NEGATIVE FOR c ANTIGEN    Unit Number [id number]    Blood Component Type RED CELLS,LR    Unit division 00    Status of Unit ISSUED,FINAL    Transfusion Status OK TO TRANSFUSE    Crossmatch Result COMPATIBLE    Donor AG Type NEGATIVE FOR E ANTIGEN NEGATIVE FOR c ANTIGEN    Unit Number [id number]    Blood Component Type RED CELLS,LR    Unit division 00    Status of Unit ISSUED,FINAL    Transfusion Status OK TO TRANSFUSE    Crossmatch Result COMPATIBLE    Donor AG Type NEGATIVE FOR E ANTIGEN NEGATIVE FOR c ANTIGEN    Unit Number [id number]    Blood Component Type RED CELLS,LR    Unit division 00    Status of Unit REL FROM [facility]    Transfusion Status OK TO TRANSFUSE    Crossmatch Result COMPATIBLE   Troponin I (High Sensitivity)     Status: None   Collection Time: 01/29/22  8:56 PM  Result Value Ref Range   Troponin I (High Sensitivity) 5 <18 ng/L    Comment: (NOTE) Elevated high sensitivity troponin I (hsTnI) values and significant  changes across serial measurements may suggest ACS but many other  chronic and acute conditions are known to elevate hsTnI results.  Refer to the "Links" section for chest pain algorithms and additional  guidance. Performed at [facility], [place], [place] [address]   BPAM RBC  Status: None   Collection Time: 01/29/22  8:56 PM  Result Value Ref Range   Blood Product Unit Number [id number]    PRODUCT CODE I6962X52    Unit Type and Rh 8413    Blood Product Expiration Date 244010272536    ISSUE DATE / TIME 644034742595    Blood Product Unit Number [id number]    PRODUCT CODE [id number]    Unit Type and Rh 3016    Blood Product Expiration Date 010932355732    ISSUE DATE / TIME 202542706237    Blood Product Unit Number [id number]    PRODUCT CODE [id number]    Unit Type and Rh 6200    Blood Product Expiration Date 485462703500    ISSUE DATE / TIME 938182993716    Blood Product Unit Number [id number]    PRODUCT CODE W2585I77    Unit Type and Rh 6200    Blood Product Expiration Date 824235361443    Blood Product Unit Number [id number]    PRODUCT CODE [id number]    Unit Type and Rh 5100    Blood Product Expiration Date 932671245809   Lactic acid, plasma     Status: None   Collection Time: 01/29/22 11:44 PM  Result Value Ref Range   Lactic Acid, Venous 1.1 0.5 - 1.9 mmol/L    Comment: Performed at [facility], [address] [place], [place] [address]  Protime-INR     Status: Abnormal   Collection Time: 01/29/22 11:44 PM  Result Value Ref Range   Prothrombin Time 23.3 (H) 11.4 - 15.2 seconds   INR 2.1 (H) 0.8 - 1.2    Comment: (NOTE) INR goal varies based on device and disease states. Performed at [facility], [address] [place], [place] [address]   CBC     Status: Abnormal   Collection Time: 01/30/22 10:31 AM  Result Value Ref Range   WBC 3.9 (L) 4.0 - 10.5 K/uL   RBC 2.99 (L) 3.87 - 5.11 MIL/uL   Hemoglobin 8.0 (L) 12.0 - 15.0 g/dL    Comment: REPEATED TO VERIFY   HCT 25.4 (L) 36.0 - 46.0 %   MCV 84.9 80.0 - 100.0 fL   MCH 26.8 26.0 - 34.0 pg   MCHC 31.5 30.0 - 36.0 g/dL   RDW 16.4 (H) 11.5 - 15.5 %   Platelets 198 150 - 400 K/uL   nRBC 0.0 0.0 - 0.2 %    Comment: Performed at [facility], [address] [place], [place] [address]  Folate     Status: None   Collection Time: 01/30/22 10:31 AM  Result Value Ref Range   Folate 29.0 >5.9 ng/mL    Comment: Performed at [facility], [address] [place],  [address]  MRSA Next Gen by PCR, Nasal     Status: Abnormal   Collection Time: 01/30/22  6:07 PM   Specimen: Nasal Mucosa; Nasal Swab  Result Value Ref Range   MRSA by PCR Next Gen DETECTED (A) NOT DETECTED    Comment: RESULT CALLED TO, READ BACK BY AND VERIFIED WITH: ERIN LOUISSAINT AT 2045 ON 01/30/22 BY SS (NOTE) The GeneXpert MRSA Assay (FDA approved for NASAL specimens only), is one component of a comprehensive MRSA colonization surveillance program. It is not intended to diagnose MRSA infection nor to guide or monitor treatment for MRSA infections. Test performance is not FDA approved in patients less than 38 years old. Performed at [facility], [place], [place]  [address]   Vitamin B12     Status: None   Collection Time: 01/30/22  7:52 PM  Result Value Ref Range   Vitamin B-12 320 180 - 914 pg/mL    Comment: (NOTE) This assay is not validated for testing neonatal or myeloproliferative syndrome specimens for Vitamin B12 levels. Performed at [facility], [address] [place] [address]   CBC     Status: Abnormal   Collection Time: 02/02/22  4:11 AM  Result Value Ref Range   WBC 4.9 4.0 - 10.5 K/uL   RBC 2.97 (L) 3.87 - 5.11 MIL/uL   Hemoglobin 7.8 (L) 12.0 - 15.0 g/dL   HCT 25.3 (L) 36.0 - 46.0 %   MCV 85.2 80.0 - 100.0 fL   MCH 26.3 26.0 - 34.0 pg   MCHC 30.8 30.0 - 36.0 g/dL   RDW 18.4 (H) 11.5 - 15.5 %   Platelets 225 150 - 400 K/uL   nRBC 0.0 0.0 - 0.2 %    Comment: Performed at [facility], [address], [place], [place] [address]  Basic metabolic panel     Status: Abnormal   Collection Time: 02/02/22  4:11 AM  Result Value Ref Range   Sodium 139 135 - 145 mmol/L   Potassium  4.1 3.5 - 5.1 mmol/L   Chloride 110 98 - 111 mmol/L   CO2 24 22 - 32 mmol/L   Glucose, Bld 114 (H) 70 - 99 mg/dL    Comment: Glucose reference range applies only to samples taken after fasting for at least 8 hours.   BUN 15 8 - 23 mg/dL   Creatinine, Ser 0.84 0.44 - 1.00 mg/dL   Calcium 8.4 (L) 8.9 - 10.3 mg/dL   GFR, Estimated >60 >60 mL/min    Comment: (NOTE) Calculated using the CKD-EPI Creatinine Equation (2021)    Anion gap 5 5 - 15    Comment: Performed at [facility], [address] [place], [place] [address]    Radiology CT ANGIO GI BLEED  Result Date: 01/30/2022 CLINICAL DATA:  Presyncope, black stool per rectum, GI bleed EXAM: CTA ABDOMEN AND PELVIS WITHOUT AND WITH CONTRAST TECHNIQUE: Multidetector CT imaging of the abdomen and pelvis was performed using the standard protocol during bolus administration of intravenous contrast. Multiplanar reconstructed images and MIPs were obtained and reviewed to evaluate the vascular anatomy. RADIATION DOSE REDUCTION: This exam was performed according to the departmental dose-optimization program which includes automated exposure control, adjustment of the mA and/or kV according to patient size and/or use of iterative reconstruction technique. CONTRAST:  118m OMNIPAQUE IOHEXOL 350 MG/ML SOLN COMPARISON:  CT abdomen/pelvis dated 12/24/2021 FINDINGS: VASCULAR Aorta: No evidence abdominal aortic aneurysm or dissection. Patent. Atherosclerotic calcifications. Celiac: Patent.  Atherosclerotic calcifications at the origin. SMA: Patent.  Atherosclerotic calcifications of the origin. Renals: Patent. Atherosclerotic calcifications at the origin bilaterally. IMA: Patent. Inflow: Patent bilaterally.  Atherosclerotic calcifications. Proximal Outflow: Patent. Veins: IVC filter.  Otherwise unremarkable. No active GI bleeding. See below for additional pertinent findings in the stomach. Review of the MIP images confirms the above findings. NON-VASCULAR  Lower chest: Lung bases are clear. Hepatobiliary: Liver is within normal limits. Gallbladder is unremarkable. No intrahepatic or extrahepatic ductal dilatation. Pancreas: Within normal limits. Spleen: Within normal limits. Adrenals/Urinary Tract: Adrenal glands are within normal limits. Left kidney is notable for a percutaneous nephrostomy. Right kidney is within normal limits. No hydronephrosis. Bladder is mildly thick-walled with perivesical stranding (series 7/image 35), although  underdistended. Stomach/Bowel: Stomach is notable for mural hyperenhancement with submucosal edema along the gastric antrum (series 12/image 27), suspicious for a gastric ulcer. Slightly asymmetric/focal thickening along the superior wall of the gastric antrum at least raises the possibility of an underlying lesion in this location. No evidence of bowel obstruction. Normal appendix (series 6/image 152). No colonic wall thickening or inflammatory changes. Lymphatic: No suspicious abdominopelvic lymphadenopathy. Reproductive: Uterus is within normal limits. No adnexal masses. Other: No abdominopelvic ascites. Musculoskeletal: Degenerative changes of the visualized thoracolumbar spine. IMPRESSION: Suspected gastric ulcer in the gastric antrum. Underlying mass lesion is possible. Upper endoscopy is suggested. No evidence of active GI bleeding. Mildly thick-walled bladder with perivesical stranding, correlate for cystitis. Electronically Signed   By: Julian Hy M.D.   On: 01/30/2022 00:57   DG Chest Port 1 View  Result Date: 01/29/2022 CLINICAL DATA:  Chest pain and shortness of breath. EXAM: PORTABLE CHEST 1 VIEW COMPARISON:  None Available. FINDINGS: The right internal jugular venous catheter seen on the prior study has been removed. The heart size and mediastinal contours are within normal limits. Both lungs are clear. Multilevel degenerative changes are seen throughout the thoracic spine. IMPRESSION: No active cardiopulmonary  disease. Electronically Signed   By: Virgina Norfolk M.D.   On: 01/29/2022 20:14    Assessment/Plan  Lymphedema The patient has fairly marked swelling and lymphedema changes which are largely result of her previous DVT and chronic swelling.  Her postphlebitic issues and lymphedema are exacerbated by her immobility and dependency.  She needs to get in a compression garment of some sort but cannot get stockings on.  I have prescribed her the juxta lite compression system and asked her to use these daily.  I also believe she will benefit from a lymphedema pump significantly and we will obtain that via insurance if possible.  We will see her back in 3 months to discuss her regimen and see how she is doing.  Given her intermittent ulceration, she may ultimately need Unna boots and we have asked her to contact our office should she develop ulceration or infection.  HLD (hyperlipidemia) lipid control important in reducing the progression of atherosclerotic disease. Continue statin therapy   Diabetes mellitus without complication (HCC) blood glucose control important in reducing the progression of atherosclerotic disease. Also, involved in wound healing. On appropriate medications.    Leotis Pain, MD  02/08/2022 10:42 AM    This note was created with Dragon medical transcription system.  Any errors from dictation are purely unintentional

## 2022-02-08 NOTE — Assessment & Plan Note (Signed)
lipid control important in reducing the progression of atherosclerotic disease. Continue statin therapy  

## 2022-02-08 NOTE — Assessment & Plan Note (Addendum)
The patient has fairly marked swelling and lymphedema changes which are largely result of her previous DVT and chronic swelling.  She has significant dermal thickening and stasis skin changes.  Her postphlebitic issues and lymphedema are exacerbated by her immobility and dependency.  She needs to get in a compression garment of some sort but cannot get stockings on.  I have prescribed her the juxta lite compression system and asked her to use these daily.  I also believe she will benefit from a lymphedema pump significantly and we will obtain that via insurance if possible.  We will see her back in 3 months to discuss her regimen and see how she is doing.  Given her intermittent ulceration, she may ultimately need Unna boots and we have asked her to contact our office should she develop ulceration or infection.

## 2022-02-09 ENCOUNTER — Other Ambulatory Visit: Payer: Self-pay

## 2022-02-12 ENCOUNTER — Ambulatory Visit: Admission: RE | Admit: 2022-02-12 | Payer: Medicare (Managed Care) | Source: Home / Self Care | Admitting: Urology

## 2022-02-12 ENCOUNTER — Ambulatory Visit: Admit: 2022-02-12 | Payer: Medicare (Managed Care) | Admitting: Ophthalmology

## 2022-02-12 ENCOUNTER — Encounter: Admission: RE | Payer: Self-pay | Source: Home / Self Care

## 2022-02-12 SURGERY — CYSTOSCOPY, FLEXIBLE, WITH STENT REPLACEMENT
Anesthesia: Choice | Laterality: Left

## 2022-02-12 SURGERY — PHACOEMULSIFICATION, CATARACT, WITH IOL INSERTION
Anesthesia: Topical | Laterality: Right

## 2022-02-13 ENCOUNTER — Telehealth (HOSPITAL_COMMUNITY): Payer: Self-pay | Admitting: Physician Assistant

## 2022-02-13 NOTE — Progress Notes (Signed)
Patient called due to pain at nephrostomy tube site.  She explains it hurts all the time and she requests the PCN removed. She denies fever, chills, nausea, and vomiting.   Explained to patient we generally need urology to put in an order for removal and recommended she reach out to them.  Expressed we would be happy to evaluate the tube if she was concerned it to be malfunctioning.  She is scheduled for an exchange in two weeks.   Electronically Signed: Pasty Spillers, PA-C 02/13/2022, 2:29 PM

## 2022-02-15 ENCOUNTER — Other Ambulatory Visit: Payer: Self-pay | Admitting: *Deleted

## 2022-02-15 NOTE — Telephone Encounter (Signed)
error 

## 2022-02-18 ENCOUNTER — Encounter: Payer: Self-pay | Admitting: Oncology

## 2022-02-20 DIAGNOSIS — N3001 Acute cystitis with hematuria: Secondary | ICD-10-CM

## 2022-02-21 ENCOUNTER — Telehealth: Payer: Self-pay | Admitting: *Deleted

## 2022-02-21 NOTE — Telephone Encounter (Signed)
Informed Dr. Felipa Eth voiced understanding.   _______________________________________________  From reviewing the records, the nephrostomy tube plan is for it to be permanent with changes about every 2 months.  They can try flushing the tube, but it was in appropriate position on CT from a few weeks ago.  Unfortunately there is nothing that we can offer differently here in clinic, would keep scheduled follow-up for IR next week, do not remove nephrostomy tube   Nickolas Madrid, MD  02/21/2022

## 2022-02-21 NOTE — Telephone Encounter (Addendum)
Received a call from Dr. Morley Kos [phone] at [facility] regarding patient's nephrostomy tube. She states she was concerned with the amount of pain patient is experiencing. Denies fevers, pain or chills.  She has an appointment with IR 02/25/22 for an exchange. IR deferred if tube needs removed need clarification.

## 2022-02-23 ENCOUNTER — Inpatient Hospital Stay
Admission: EM | Admit: 2022-02-23 | Discharge: 2022-03-01 | DRG: 811 | Disposition: A | Discharge status: Home / Self Care | Payer: Medicare HMO | Attending: Internal Medicine | Admitting: Internal Medicine

## 2022-02-23 ENCOUNTER — Other Ambulatory Visit: Payer: Self-pay

## 2022-02-23 ENCOUNTER — Emergency Department: Payer: Medicare HMO

## 2022-02-23 DIAGNOSIS — N3040 Irradiation cystitis without hematuria: Secondary | ICD-10-CM | POA: Diagnosis present

## 2022-02-23 DIAGNOSIS — I959 Hypotension, unspecified: Secondary | ICD-10-CM | POA: Diagnosis present

## 2022-02-23 DIAGNOSIS — E861 Hypovolemia: Secondary | ICD-10-CM | POA: Diagnosis present

## 2022-02-23 DIAGNOSIS — Z20822 Contact with and (suspected) exposure to covid-19: Secondary | ICD-10-CM | POA: Diagnosis present

## 2022-02-23 DIAGNOSIS — Z923 Personal history of irradiation: Secondary | ICD-10-CM

## 2022-02-23 DIAGNOSIS — E876 Hypokalemia: Secondary | ICD-10-CM | POA: Diagnosis present

## 2022-02-23 DIAGNOSIS — Z86711 Personal history of pulmonary embolism: Secondary | ICD-10-CM | POA: Diagnosis present

## 2022-02-23 DIAGNOSIS — I890 Lymphedema, not elsewhere classified: Secondary | ICD-10-CM

## 2022-02-23 DIAGNOSIS — R079 Chest pain, unspecified: Secondary | ICD-10-CM

## 2022-02-23 DIAGNOSIS — I82513 Chronic embolism and thrombosis of femoral vein, bilateral: Secondary | ICD-10-CM | POA: Diagnosis present

## 2022-02-23 DIAGNOSIS — D62 Acute posthemorrhagic anemia: Secondary | ICD-10-CM | POA: Diagnosis not present

## 2022-02-23 DIAGNOSIS — J9601 Acute respiratory failure with hypoxia: Secondary | ICD-10-CM | POA: Diagnosis not present

## 2022-02-23 DIAGNOSIS — E119 Type 2 diabetes mellitus without complications: Secondary | ICD-10-CM

## 2022-02-23 DIAGNOSIS — D649 Anemia, unspecified: Secondary | ICD-10-CM | POA: Diagnosis present

## 2022-02-23 DIAGNOSIS — Z7901 Long term (current) use of anticoagulants: Secondary | ICD-10-CM

## 2022-02-23 DIAGNOSIS — I110 Hypertensive heart disease with heart failure: Secondary | ICD-10-CM | POA: Diagnosis present

## 2022-02-23 DIAGNOSIS — I5032 Chronic diastolic (congestive) heart failure: Secondary | ICD-10-CM | POA: Diagnosis present

## 2022-02-23 DIAGNOSIS — K219 Gastro-esophageal reflux disease without esophagitis: Secondary | ICD-10-CM | POA: Diagnosis present

## 2022-02-23 DIAGNOSIS — D500 Iron deficiency anemia secondary to blood loss (chronic): Secondary | ICD-10-CM | POA: Diagnosis present

## 2022-02-23 DIAGNOSIS — Z8542 Personal history of malignant neoplasm of other parts of uterus: Secondary | ICD-10-CM

## 2022-02-23 DIAGNOSIS — I82221 Chronic embolism and thrombosis of inferior vena cava: Secondary | ICD-10-CM | POA: Diagnosis present

## 2022-02-23 DIAGNOSIS — K31819 Angiodysplasia of stomach and duodenum without bleeding: Secondary | ICD-10-CM | POA: Diagnosis present

## 2022-02-23 DIAGNOSIS — Y842 Radiological procedure and radiotherapy as the cause of abnormal reaction of the patient, or of later complication, without mention of misadventure at the time of the procedure: Secondary | ICD-10-CM | POA: Diagnosis present

## 2022-02-23 DIAGNOSIS — J189 Pneumonia, unspecified organism: Secondary | ICD-10-CM

## 2022-02-23 DIAGNOSIS — K922 Gastrointestinal hemorrhage, unspecified: Secondary | ICD-10-CM | POA: Diagnosis present

## 2022-02-23 DIAGNOSIS — Z83438 Family history of other disorder of lipoprotein metabolism and other lipidemia: Secondary | ICD-10-CM

## 2022-02-23 DIAGNOSIS — C541 Malignant neoplasm of endometrium: Secondary | ICD-10-CM | POA: Diagnosis present

## 2022-02-23 DIAGNOSIS — Z8249 Family history of ischemic heart disease and other diseases of the circulatory system: Secondary | ICD-10-CM

## 2022-02-23 DIAGNOSIS — J209 Acute bronchitis, unspecified: Secondary | ICD-10-CM | POA: Diagnosis present

## 2022-02-23 DIAGNOSIS — E7800 Pure hypercholesterolemia, unspecified: Secondary | ICD-10-CM | POA: Diagnosis present

## 2022-02-23 DIAGNOSIS — Z803 Family history of malignant neoplasm of breast: Secondary | ICD-10-CM

## 2022-02-23 DIAGNOSIS — Z833 Family history of diabetes mellitus: Secondary | ICD-10-CM

## 2022-02-23 DIAGNOSIS — I82409 Acute embolism and thrombosis of unspecified deep veins of unspecified lower extremity: Secondary | ICD-10-CM | POA: Diagnosis present

## 2022-02-23 DIAGNOSIS — N1330 Unspecified hydronephrosis: Secondary | ICD-10-CM

## 2022-02-23 DIAGNOSIS — I9589 Other hypotension: Secondary | ICD-10-CM | POA: Diagnosis present

## 2022-02-23 DIAGNOSIS — E1151 Type 2 diabetes mellitus with diabetic peripheral angiopathy without gangrene: Secondary | ICD-10-CM | POA: Diagnosis present

## 2022-02-23 DIAGNOSIS — Z95828 Presence of other vascular implants and grafts: Secondary | ICD-10-CM

## 2022-02-23 DIAGNOSIS — B957 Other staphylococcus as the cause of diseases classified elsewhere: Secondary | ICD-10-CM

## 2022-02-23 DIAGNOSIS — Z86010 Personal history of colonic polyps: Secondary | ICD-10-CM

## 2022-02-23 DIAGNOSIS — I82413 Acute embolism and thrombosis of femoral vein, bilateral: Secondary | ICD-10-CM | POA: Diagnosis present

## 2022-02-23 DIAGNOSIS — I745 Embolism and thrombosis of iliac artery: Secondary | ICD-10-CM | POA: Diagnosis present

## 2022-02-23 DIAGNOSIS — I2782 Chronic pulmonary embolism: Secondary | ICD-10-CM | POA: Diagnosis present

## 2022-02-23 DIAGNOSIS — R7881 Bacteremia: Secondary | ICD-10-CM

## 2022-02-23 DIAGNOSIS — I82411 Acute embolism and thrombosis of right femoral vein: Secondary | ICD-10-CM

## 2022-02-23 LAB — CBC WITH DIFFERENTIAL/PLATELET
Abs Immature Granulocytes: 0.01 10*3/uL (ref 0.00–0.07)
Basophils Absolute: 0 10*3/uL (ref 0.0–0.1)
Basophils Relative: 1 %
Eosinophils Absolute: 0.5 10*3/uL (ref 0.0–0.5)
Eosinophils Relative: 10 %
HCT: 23.1 % — ABNORMAL LOW (ref 36.0–46.0)
Hemoglobin: 6.8 g/dL — ABNORMAL LOW (ref 12.0–15.0)
Immature Granulocytes: 0 %
Lymphocytes Relative: 18 %
Lymphs Abs: 1 10*3/uL (ref 0.7–4.0)
MCH: 25.3 pg — ABNORMAL LOW (ref 26.0–34.0)
MCHC: 29.4 g/dL — ABNORMAL LOW (ref 30.0–36.0)
MCV: 85.9 fL (ref 80.0–100.0)
Monocytes Absolute: 0.3 10*3/uL (ref 0.1–1.0)
Monocytes Relative: 6 %
Neutro Abs: 3.5 10*3/uL (ref 1.7–7.7)
Neutrophils Relative %: 65 %
Platelets: 288 10*3/uL (ref 150–400)
RBC: 2.69 MIL/uL — ABNORMAL LOW (ref 3.87–5.11)
RDW: 19.4 % — ABNORMAL HIGH (ref 11.5–15.5)
WBC: 5.3 10*3/uL (ref 4.0–10.5)
nRBC: 0 % (ref 0.0–0.2)

## 2022-02-23 LAB — COMPREHENSIVE METABOLIC PANEL
ALT: 11 U/L (ref 0–44)
AST: 22 U/L (ref 15–41)
Albumin: 3.2 g/dL — ABNORMAL LOW (ref 3.5–5.0)
Alkaline Phosphatase: 41 U/L (ref 38–126)
Anion gap: 7 (ref 5–15)
BUN: 18 mg/dL (ref 8–23)
CO2: 27 mmol/L (ref 22–32)
Calcium: 8.6 mg/dL — ABNORMAL LOW (ref 8.9–10.3)
Chloride: 105 mmol/L (ref 98–111)
Creatinine, Ser: 0.89 mg/dL (ref 0.44–1.00)
GFR, Estimated: >60 mL/min (ref >60)
Glucose, Bld: 152 mg/dL — ABNORMAL HIGH (ref 70–99)
Potassium: 3.3 mmol/L — ABNORMAL LOW (ref 3.5–5.1)
Sodium: 139 mmol/L (ref 135–145)
Total Bilirubin: 0.7 mg/dL (ref 0.3–1.2)
Total Protein: 6.7 g/dL (ref 6.5–8.1)

## 2022-02-23 LAB — D-DIMER, QUANTITATIVE: D-Dimer, Quant: 1 ug/mL-FEU — ABNORMAL HIGH (ref 0.00–0.50)

## 2022-02-23 MED ORDER — SODIUM CHLORIDE 0.9 % IV SOLN
10.0000 mL/h | Freq: Once | INTRAVENOUS | Status: AC
  Administered 2022-02-24: 10 mL/h via INTRAVENOUS

## 2022-02-23 NOTE — ED Triage Notes (Signed)
FIRST NURSE NOTE:  Pt arrived via POV with daughter, reports she has not been taking her blood thinner for a couple of weeks due to running out, reports bilateral leg swelling and feet swelling.  Pt also c/o lightheadedness and feels like her blood is low, pt does appear to be pale.  Pt has a tube in her back that is bleeding as well.

## 2022-02-24 ENCOUNTER — Emergency Department: Payer: Medicare HMO

## 2022-02-24 ENCOUNTER — Encounter: Payer: Self-pay | Admitting: Internal Medicine

## 2022-02-24 DIAGNOSIS — I82413 Acute embolism and thrombosis of femoral vein, bilateral: Secondary | ICD-10-CM | POA: Diagnosis present

## 2022-02-24 DIAGNOSIS — I82221 Chronic embolism and thrombosis of inferior vena cava: Secondary | ICD-10-CM | POA: Diagnosis present

## 2022-02-24 DIAGNOSIS — K219 Gastro-esophageal reflux disease without esophagitis: Secondary | ICD-10-CM | POA: Diagnosis present

## 2022-02-24 DIAGNOSIS — Z803 Family history of malignant neoplasm of breast: Secondary | ICD-10-CM | POA: Diagnosis not present

## 2022-02-24 DIAGNOSIS — I82412 Acute embolism and thrombosis of left femoral vein: Secondary | ICD-10-CM | POA: Diagnosis not present

## 2022-02-24 DIAGNOSIS — C541 Malignant neoplasm of endometrium: Secondary | ICD-10-CM | POA: Diagnosis not present

## 2022-02-24 DIAGNOSIS — E119 Type 2 diabetes mellitus without complications: Secondary | ICD-10-CM

## 2022-02-24 DIAGNOSIS — D500 Iron deficiency anemia secondary to blood loss (chronic): Secondary | ICD-10-CM | POA: Diagnosis not present

## 2022-02-24 DIAGNOSIS — Z86711 Personal history of pulmonary embolism: Secondary | ICD-10-CM

## 2022-02-24 DIAGNOSIS — E861 Hypovolemia: Secondary | ICD-10-CM

## 2022-02-24 DIAGNOSIS — Z86718 Personal history of other venous thrombosis and embolism: Secondary | ICD-10-CM | POA: Diagnosis not present

## 2022-02-24 DIAGNOSIS — I82513 Chronic embolism and thrombosis of femoral vein, bilateral: Secondary | ICD-10-CM | POA: Diagnosis present

## 2022-02-24 DIAGNOSIS — N3040 Irradiation cystitis without hematuria: Secondary | ICD-10-CM | POA: Diagnosis present

## 2022-02-24 DIAGNOSIS — Y842 Radiological procedure and radiotherapy as the cause of abnormal reaction of the patient, or of later complication, without mention of misadventure at the time of the procedure: Secondary | ICD-10-CM | POA: Diagnosis present

## 2022-02-24 DIAGNOSIS — I825Y3 Chronic embolism and thrombosis of unspecified deep veins of proximal lower extremity, bilateral: Secondary | ICD-10-CM

## 2022-02-24 DIAGNOSIS — K31819 Angiodysplasia of stomach and duodenum without bleeding: Secondary | ICD-10-CM

## 2022-02-24 DIAGNOSIS — I110 Hypertensive heart disease with heart failure: Secondary | ICD-10-CM | POA: Diagnosis present

## 2022-02-24 DIAGNOSIS — Z8542 Personal history of malignant neoplasm of other parts of uterus: Secondary | ICD-10-CM | POA: Diagnosis not present

## 2022-02-24 DIAGNOSIS — Z833 Family history of diabetes mellitus: Secondary | ICD-10-CM | POA: Diagnosis not present

## 2022-02-24 DIAGNOSIS — B957 Other staphylococcus as the cause of diseases classified elsewhere: Secondary | ICD-10-CM | POA: Diagnosis not present

## 2022-02-24 DIAGNOSIS — I5032 Chronic diastolic (congestive) heart failure: Secondary | ICD-10-CM

## 2022-02-24 DIAGNOSIS — Z20822 Contact with and (suspected) exposure to covid-19: Secondary | ICD-10-CM | POA: Diagnosis present

## 2022-02-24 DIAGNOSIS — I959 Hypotension, unspecified: Secondary | ICD-10-CM | POA: Diagnosis present

## 2022-02-24 DIAGNOSIS — E876 Hypokalemia: Secondary | ICD-10-CM | POA: Diagnosis present

## 2022-02-24 DIAGNOSIS — E7800 Pure hypercholesterolemia, unspecified: Secondary | ICD-10-CM | POA: Diagnosis present

## 2022-02-24 DIAGNOSIS — D649 Anemia, unspecified: Secondary | ICD-10-CM | POA: Diagnosis present

## 2022-02-24 DIAGNOSIS — Z8249 Family history of ischemic heart disease and other diseases of the circulatory system: Secondary | ICD-10-CM | POA: Diagnosis not present

## 2022-02-24 DIAGNOSIS — J9601 Acute respiratory failure with hypoxia: Secondary | ICD-10-CM | POA: Diagnosis present

## 2022-02-24 DIAGNOSIS — I745 Embolism and thrombosis of iliac artery: Secondary | ICD-10-CM | POA: Diagnosis present

## 2022-02-24 DIAGNOSIS — J209 Acute bronchitis, unspecified: Secondary | ICD-10-CM | POA: Diagnosis present

## 2022-02-24 DIAGNOSIS — I2782 Chronic pulmonary embolism: Secondary | ICD-10-CM | POA: Diagnosis present

## 2022-02-24 DIAGNOSIS — R7881 Bacteremia: Secondary | ICD-10-CM | POA: Diagnosis not present

## 2022-02-24 DIAGNOSIS — E1151 Type 2 diabetes mellitus with diabetic peripheral angiopathy without gangrene: Secondary | ICD-10-CM | POA: Diagnosis present

## 2022-02-24 DIAGNOSIS — I890 Lymphedema, not elsewhere classified: Secondary | ICD-10-CM | POA: Diagnosis present

## 2022-02-24 DIAGNOSIS — I9589 Other hypotension: Secondary | ICD-10-CM | POA: Diagnosis present

## 2022-02-24 DIAGNOSIS — K922 Gastrointestinal hemorrhage, unspecified: Secondary | ICD-10-CM | POA: Diagnosis present

## 2022-02-24 DIAGNOSIS — D62 Acute posthemorrhagic anemia: Secondary | ICD-10-CM | POA: Diagnosis present

## 2022-02-24 DIAGNOSIS — Z7901 Long term (current) use of anticoagulants: Secondary | ICD-10-CM | POA: Diagnosis not present

## 2022-02-24 LAB — URINALYSIS, ROUTINE W REFLEX MICROSCOPIC
Bilirubin Urine: NEGATIVE
Glucose, UA: NEGATIVE mg/dL
Hgb urine dipstick: NEGATIVE
Ketones, ur: NEGATIVE mg/dL
Nitrite: NEGATIVE
Protein, ur: 30 mg/dL — AB
Specific Gravity, Urine: >1.046 — ABNORMAL HIGH (ref 1.005–1.030)
pH: 5 (ref 5.0–8.0)

## 2022-02-24 LAB — BLOOD CULTURE ID PANEL (REFLEXED) - BCID2

## 2022-02-24 LAB — BASIC METABOLIC PANEL
Anion gap: 8 (ref 5–15)
BUN: 17 mg/dL (ref 8–23)
CO2: 23 mmol/L (ref 22–32)
Calcium: 7.9 mg/dL — ABNORMAL LOW (ref 8.9–10.3)
Chloride: 109 mmol/L (ref 98–111)
Creatinine, Ser: 0.82 mg/dL (ref 0.44–1.00)
GFR, Estimated: >60 mL/min (ref >60)
Glucose, Bld: 103 mg/dL — ABNORMAL HIGH (ref 70–99)
Potassium: 3.2 mmol/L — ABNORMAL LOW (ref 3.5–5.1)
Sodium: 140 mmol/L (ref 135–145)

## 2022-02-24 LAB — TYPE AND SCREEN

## 2022-02-24 LAB — SARS CORONAVIRUS 2 BY RT PCR: SARS Coronavirus 2 by RT PCR: NEGATIVE

## 2022-02-24 LAB — TROPONIN I (HIGH SENSITIVITY)
Troponin I (High Sensitivity): 4 ng/L (ref <18)
Troponin I (High Sensitivity): 5 ng/L (ref <18)

## 2022-02-24 LAB — CBC
HCT: 23.6 % — ABNORMAL LOW (ref 36.0–46.0)
Hemoglobin: 7.2 g/dL — ABNORMAL LOW (ref 12.0–15.0)
MCH: 25.6 pg — ABNORMAL LOW (ref 26.0–34.0)
MCHC: 30.5 g/dL (ref 30.0–36.0)
MCV: 84 fL (ref 80.0–100.0)
Platelets: 240 10*3/uL (ref 150–400)
RBC: 2.81 MIL/uL — ABNORMAL LOW (ref 3.87–5.11)
RDW: 18.7 % — ABNORMAL HIGH (ref 11.5–15.5)
WBC: 4.6 10*3/uL (ref 4.0–10.5)
nRBC: 0 % (ref 0.0–0.2)

## 2022-02-24 LAB — LACTIC ACID, PLASMA: Lactic Acid, Venous: 0.7 mmol/L (ref 0.5–1.9)

## 2022-02-24 LAB — PROCALCITONIN: Procalcitonin: <0.1 ng/mL

## 2022-02-24 LAB — PREPARE RBC (CROSSMATCH)

## 2022-02-24 LAB — BRAIN NATRIURETIC PEPTIDE: B Natriuretic Peptide: 51.9 pg/mL (ref 0.0–100.0)

## 2022-02-24 MED ORDER — CEFEPIME HCL 2 G IV SOLR
2.0000 g | Freq: Once | INTRAVENOUS | Status: AC
  Administered 2022-02-24: 2 g via INTRAVENOUS
  Filled 2022-02-24: qty 12.5

## 2022-02-24 MED ORDER — ONDANSETRON HCL 4 MG/2ML IJ SOLN
4.0000 mg | Freq: Once | INTRAMUSCULAR | Status: AC
Start: 2022-02-24 — End: 2022-02-24
  Administered 2022-02-24: 4 mg via INTRAVENOUS
  Filled 2022-02-24: qty 2

## 2022-02-24 MED ORDER — ACETAMINOPHEN 650 MG RE SUPP: 650.0000 mg | Freq: Four times a day (QID) | RECTAL | Status: DC | PRN

## 2022-02-24 MED ORDER — ONDANSETRON HCL 4 MG PO TABS: 4.0000 mg | ORAL_TABLET | Freq: Four times a day (QID) | ORAL | Status: DC | PRN

## 2022-02-24 MED ORDER — SODIUM CHLORIDE 0.9 % IV BOLUS (SEPSIS)
500.0000 mL | Freq: Once | INTRAVENOUS | Status: AC
  Administered 2022-02-24: 500 mL via INTRAVENOUS

## 2022-02-24 MED ORDER — ACETAMINOPHEN 325 MG PO TABS
650.0000 mg | ORAL_TABLET | Freq: Four times a day (QID) | ORAL | Status: DC | PRN
  Administered 2022-02-25 – 2022-02-28 (×2): 650 mg via ORAL
  Filled 2022-02-24 (×2): qty 2

## 2022-02-24 MED ORDER — SODIUM CHLORIDE 0.9 % IV BOLUS
1000.0000 mL | Freq: Once | INTRAVENOUS | Status: AC
  Administered 2022-02-24: 1000 mL via INTRAVENOUS

## 2022-02-24 MED ORDER — SODIUM CHLORIDE 0.9% IV SOLUTION: Freq: Once | INTRAVENOUS | Status: DC

## 2022-02-24 MED ORDER — ONDANSETRON HCL 4 MG/2ML IJ SOLN: 4.0000 mg | Freq: Four times a day (QID) | INTRAMUSCULAR | Status: DC | PRN

## 2022-02-24 MED ORDER — IOHEXOL 350 MG/ML SOLN
75.0000 mL | Freq: Once | INTRAVENOUS | Status: AC | PRN
  Administered 2022-02-24: 75 mL via INTRAVENOUS

## 2022-02-24 MED ORDER — LACTULOSE 10 GM/15ML PO SOLN
20.0000 g | Freq: Three times a day (TID) | ORAL | Status: DC
  Filled 2022-02-24: qty 30

## 2022-02-24 MED ORDER — METRONIDAZOLE 500 MG/100ML IV SOLN
500.0000 mg | Freq: Once | INTRAVENOUS | Status: AC
  Administered 2022-02-24: 500 mg via INTRAVENOUS
  Filled 2022-02-24: qty 100

## 2022-02-24 MED ORDER — FENTANYL CITRATE PF 50 MCG/ML IJ SOSY
50.0000 ug | PREFILLED_SYRINGE | Freq: Once | INTRAMUSCULAR | Status: AC
  Administered 2022-02-24: 50 ug via INTRAVENOUS
  Filled 2022-02-24: qty 1

## 2022-02-24 MED ORDER — POTASSIUM CHLORIDE CRYS ER 20 MEQ PO TBCR
40.0000 meq | EXTENDED_RELEASE_TABLET | Freq: Once | ORAL | Status: AC
Start: 2022-02-24 — End: 2022-02-24
  Administered 2022-02-24: 40 meq via ORAL
  Filled 2022-02-24: qty 2

## 2022-02-24 MED ORDER — PANTOPRAZOLE SODIUM 40 MG IV SOLR
40.0000 mg | INTRAVENOUS | Status: DC
  Administered 2022-02-24 – 2022-02-27 (×4): 40 mg via INTRAVENOUS
  Filled 2022-02-24 (×4): qty 10

## 2022-02-24 NOTE — Assessment & Plan Note (Deleted)
S/p XRT with radiation cystitis

## 2022-02-24 NOTE — Progress Notes (Signed)
PHARMACY - PHYSICIAN COMMUNICATION CRITICAL VALUE ALERT - BLOOD CULTURE IDENTIFICATION (BCID)  Courtney Mack is an 66 y.o. female who presented to [facility] on 02/23/2022 with a chief complaint of blood loss, GI bleed   Assessment:  Staph epi in 1 of 4 bottles (aerobic) , Mec A detected,  most likely a contaminant (include suspected source if known)  Name of physician (or Provider) Contacted: Neomia Glass, NP   Current antibiotics: none  Changes to prescribed antibiotics recommended:  Will not start abx  Results for orders placed or performed during the hospital encounter of 09/11/21  Blood Culture ID Panel (Reflexed) (Collected: 09/11/2021  2:23 AM)  Result Value Ref Range   Enterococcus faecalis NOT DETECTED NOT DETECTED   Enterococcus Faecium NOT DETECTED NOT DETECTED   Listeria monocytogenes NOT DETECTED NOT DETECTED   Staphylococcus species NOT DETECTED NOT DETECTED   Staphylococcus aureus (BCID) NOT DETECTED NOT DETECTED   Staphylococcus epidermidis NOT DETECTED NOT DETECTED   Staphylococcus lugdunensis NOT DETECTED NOT DETECTED   Streptococcus species NOT DETECTED NOT DETECTED   Streptococcus agalactiae NOT DETECTED NOT DETECTED   Streptococcus pneumoniae NOT DETECTED NOT DETECTED   Streptococcus pyogenes NOT DETECTED NOT DETECTED   A.calcoaceticus-baumannii NOT DETECTED NOT DETECTED   Bacteroides fragilis NOT DETECTED NOT DETECTED   Enterobacterales DETECTED (A) NOT DETECTED   Enterobacter cloacae complex NOT DETECTED NOT DETECTED   Escherichia coli NOT DETECTED NOT DETECTED   Klebsiella aerogenes NOT DETECTED NOT DETECTED   Klebsiella oxytoca NOT DETECTED NOT DETECTED   Klebsiella pneumoniae NOT DETECTED NOT DETECTED   Proteus species DETECTED (A) NOT DETECTED   Salmonella species NOT DETECTED NOT DETECTED   Serratia marcescens NOT DETECTED NOT DETECTED   Haemophilus influenzae NOT DETECTED NOT DETECTED   Neisseria meningitidis NOT DETECTED NOT DETECTED    Pseudomonas aeruginosa NOT DETECTED NOT DETECTED   Stenotrophomonas maltophilia NOT DETECTED NOT DETECTED   Candida albicans NOT DETECTED NOT DETECTED   Candida auris NOT DETECTED NOT DETECTED   Candida glabrata NOT DETECTED NOT DETECTED   Candida krusei NOT DETECTED NOT DETECTED   Candida parapsilosis NOT DETECTED NOT DETECTED   Candida tropicalis NOT DETECTED NOT DETECTED   Cryptococcus neoformans/gattii NOT DETECTED NOT DETECTED   CTX-M ESBL NOT DETECTED NOT DETECTED   Carbapenem resistance IMP NOT DETECTED NOT DETECTED   Carbapenem resistance KPC NOT DETECTED NOT DETECTED   Carbapenem resistance NDM NOT DETECTED NOT DETECTED   Carbapenem resist OXA 48 LIKE NOT DETECTED NOT DETECTED   Carbapenem resistance VIM NOT DETECTED NOT DETECTED    Kadeisha Betsch D 02/24/2022  10:23 PM

## 2022-02-24 NOTE — Assessment & Plan Note (Signed)
Compensated Hold Lasix due to hypotension

## 2022-02-24 NOTE — Assessment & Plan Note (Signed)
Holding Lasix due to soft blood pressure

## 2022-02-24 NOTE — Assessment & Plan Note (Signed)
Secondary to hypovolemia from ongoing blood loss, diuretic treatment IV hydration to keep MAP over 65 Hold antihypertensives

## 2022-02-24 NOTE — TOC Initial Note (Signed)
Transition of Care [facility]) - Initial/Assessment Note    Patient Details  Name: Courtney Mack MRN: [id number] Date of Birth: [birth year]  Transition of Care [facility]) CM/SW Contact:    Beverly Sessions, RN Phone Number: 02/24/2022, 2:09 PM  Clinical Narrative:                   Admitted for: anemia  Admitted from:Home with daughter ZJI:RCVELFY Drew Pharmacy:[facility] Current home health/prior home health/DME:[facility], [facility], [facility]  Patient was previously set up with [facility] for home health services.  Per Corene Cornea with [facility] patient declined services after discharge and they are unable to accept patient back if she wishes to have home health services.   Patient states she doesn't know if she wants home health services at discharges.  She requests for TOC to follow up closer to discharge   Expected Discharge Plan: Home/Self Care Barriers to Discharge: Continued Medical Work up   Patient Goals and CMS Choice        Expected Discharge Plan and Services Expected Discharge Plan: Home/Self Care       Living arrangements for the past 2 months: Single Family Home                                      Prior Living Arrangements/Services Living arrangements for the past 2 months: Single Family Home Lives with:: Spouse Patient language and need for interpreter reviewed:: Yes Do you feel safe going back to the place where you live?: Yes      Need for Family Participation in Patient Care: Yes (Comment) Care giver support system in place?: Yes (comment) Current home services: DME Criminal Activity/Legal Involvement Pertinent to Current Situation/Hospitalization: No - Comment as needed  Activities of Daily Living Home Assistive Devices/Equipment: Eyeglasses, Dentures (specify type), Walker (specify type) ADL Screening (condition at time of admission) Patient's cognitive ability adequate to safely complete daily activities?: Yes Is the patient deaf or have  difficulty hearing?: No Does the patient have difficulty seeing, even when wearing glasses/contacts?: No Does the patient have difficulty concentrating, remembering, or making decisions?: No Patient able to express need for assistance with ADLs?: Yes Does the patient have difficulty dressing or bathing?: Yes Independently performs ADLs?: No Communication: Independent Dressing (OT): Needs assistance Is this a change from baseline?: Pre-admission baseline Grooming: Independent Feeding: Independent Bathing: Needs assistance Is this a change from baseline?: Pre-admission baseline Toileting: Independent with device (comment) In/Out Bed: Needs assistance Is this a change from baseline?: Pre-admission baseline Walks in Home: Independent with device (comment) Does the patient have difficulty walking or climbing stairs?: Yes Weakness of Legs: Both Weakness of Arms/Hands: Both  Permission Sought/Granted                  Emotional Assessment       Orientation: : Oriented to Self, Oriented to Place, Oriented to  Time, Oriented to Situation Alcohol / Substance Use: Not Applicable Psych Involvement: No (comment)  Admission diagnosis:  Acute respiratory failure with hypoxia ([place]) [J96.01] HCAP (healthcare-associated pneumonia) [J18.9] Acute deep vein thrombosis (DVT) of femoral vein of right lower extremity (HCC) [I82.411] Symptomatic anemia [D64.9] Hypotension, unspecified hypotension type [I95.9] Chest pain, unspecified type [R07.9] Patient Active Problem List   Diagnosis Date Noted   Symptomatic anemia 02/24/2022   Hypotension 02/24/2022   Acute cystitis with hematuria    Lymphedema 02/08/2022   GI  bleed 01/30/2022   Hematuria 12/24/2021   History of pulmonary embolus (PE) 12/24/2021   Chronic radiation cystitis 11/17/2021   CHF (congestive heart failure) ([place]) 11/16/2021   Gross hematuria 11/16/2021   Bilateral hydronephrosis s/p left nephrostomy tube June 2023    Gastric  antral vascular ectasia    Symptomatic anemia due to chronic blood loss 10/12/2021   Sepsis ([place]) 06/20/2021   HLD (hyperlipidemia) 06/20/2021   Chronic diastolic CHF (congestive heart failure) ([place]) 06/20/2021   Risk factors for obstructive sleep apnea 11/10/2020   Uterine cancer ([place]) 11/08/2020   IDA (iron deficiency anemia) 09/21/2020   Endometrial cancer ([place]) 09/13/2020   Vaginal bleeding 09/06/2020   Acute on chronic right femoral DVT (deep venous thrombosis) ([place]) 09/06/2020   Hx of blood clots 09/06/2020   Morbidly obese ([place]) 08/23/2020   Diabetes mellitus without complication ([place]) 91/69/4503   Dyslipidemia 08/23/2020   Vaginal bleeding, abnormal 08/18/2020   PCP:  [facility] [place]:   [address] [place] [address] Phone: [phone] Fax: [phone]  [address] [place] [address] Phone: [phone] Fax: [phone]     Social Determinants of Health (SDOH) Interventions    Readmission Risk Interventions    02/24/2022    2:07 PM 12/27/2021    2:26 PM 11/18/2021    9:04 AM  Readmission Risk Prevention Plan  Transportation Screening Complete Complete Complete  Medication Review (RN Care Manager) Complete Complete Complete  PCP or Specialist appointment within 3-5 days of discharge   Complete  HRI or Home Care Consult Patient refused Complete Complete  SW Recovery Care/Counseling Consult  Complete Complete  Palliative Care Screening Complete Complete Complete  Skilled Nursing Facility Not Applicable Not Applicable Complete

## 2022-02-24 NOTE — Assessment & Plan Note (Addendum)
Chronic radiation cystitis with intermittent hematuria s/p fulguration June 2023 Possible UTI UA with moderate leukocyte esterases but normal WBC and lactic acid and procalcitonin Patient received cefepime and Flagyl in the ED Urostomy tube to be evaluated by IR today.

## 2022-02-24 NOTE — ED Provider Notes (Signed)
[facility] Provider Note    Event Date/Time   First MD Initiated Contact with Patient 02/23/22 2323     (approximate)   History   Leg Swelling and Shortness of Breath   HPI  Courtney Mack is a 66 y.o. female with PE, DVT on Xarelto, hypertension, diabetes, hyperlipidemia, recent GI bleed with hemoglobin of 4 requiring transfusion who presents to the emergency department with her daughter with multiple complaints.  She is complaining of chest pain, shortness of breath, back pain, headache, weakness, leg swelling.  States she has been out of her Xarelto for the past week and is waiting for a refill from her doctor.  No known fevers, cough, vomiting, diarrhea.  She is not sure if she has had bloody stools or melena as she states she has poor vision from cataracts.   History provided by patient and daughter.    Past Medical History:  Diagnosis Date   Acute bilateral deep vein thrombosis (DVT) of femoral veins ([place]) 01/02/2021   Acute massive pulmonary embolism ([place]) 08/25/2020   Last Assessment & Plan:  Formatting of this note might be different from the original. Post vena caval filter and on xarelto and O2   Anemia    ARF (acute respiratory failure) ([place])    Cellulitis of left lower leg 06/20/2021   CHF (congestive heart failure) ([place])    COVID-19    Diabetes mellitus without complication ([place])    DVT (deep venous thrombosis) ([place]) 09/06/2020   Endometrial cancer (HCC)    GAVE (gastric antral vascular ectasia)    GERD (gastroesophageal reflux disease)    High cholesterol    Hx of blood clots    Hyperlipidemia    Hypertension    IDA (iron deficiency anemia) 09/21/2020   Obesity    Pressure injury of skin 06/21/2021   Pulmonary embolism ([place])    Sepsis ([place])    Thrombocytopenia ([place])    Urinary tract infection 09/13/2021    Past Surgical History:  Procedure Laterality Date   COLONOSCOPY N/A 06/21/2021   Procedure: COLONOSCOPY;  Surgeon:  Toledo, Benay Pike, MD;  Location: [facility] ENDOSCOPY;  Service: Gastroenterology;  Laterality: N/A;   CYSTOSCOPY W/ URETERAL STENT PLACEMENT Left 09/11/2021   Procedure: CYSTOSCOPY WITH RETROGRADE PYELOGRAM/URETERAL STENT PLACEMENT;  Surgeon: Abbie Sons, MD;  Location: [facility] ORS;  Service: Urology;  Laterality: Left;   CYSTOSCOPY W/ URETERAL STENT PLACEMENT Left 11/17/2021   Procedure: CYSTOSCOPY WITH STENT REPLACEMENT;  Surgeon: Irine Seal, MD;  Location: [facility] ORS;  Service: Urology;  Laterality: Left;   CYSTOSCOPY WITH FULGERATION  11/17/2021   Procedure: CYSTOSCOPY WITH FULGERATION;  Surgeon: Irine Seal, MD;  Location: [facility] ORS;  Service: Urology;;   CYSTOSCOPY WITH STENT PLACEMENT Left 10/17/2021   Procedure: CYSTOSCOPY WITH STENT PLACEMENT;  Surgeon: Abbie Sons, MD;  Location: [facility] ORS;  Service: Urology;  Laterality: Left;   CYSTOSCOPY WITH URETEROSCOPY, STONE BASKETRY AND STENT PLACEMENT Left 10/16/2021   Procedure: CYSTOSCOPY WITH URETEROSCOPY  AND STENT REMOVAL;  Surgeon: Abbie Sons, MD;  Location: [facility] ORS;  Service: Urology;  Laterality: Left;   ENTEROSCOPY N/A 08/17/2021   Procedure: ENTEROSCOPY;  Surgeon: Lin Landsman, MD;  Location: [facility] ENDOSCOPY;  Service: Gastroenterology;  Laterality: N/A;   ESOPHAGOGASTRODUODENOSCOPY N/A 06/21/2021   Procedure: ESOPHAGOGASTRODUODENOSCOPY (EGD);  Surgeon: Toledo, Benay Pike, MD;  Location: [facility] ENDOSCOPY;  Service: Gastroenterology;  Laterality: N/A;   ESOPHAGOGASTRODUODENOSCOPY (EGD) WITH PROPOFOL N/A 08/17/2021   Procedure: ESOPHAGOGASTRODUODENOSCOPY (EGD) WITH PROPOFOL;  Surgeon: Lin Landsman, MD;  Location: [facility] ENDOSCOPY;  Service: Gastroenterology;  Laterality: N/A;   ESOPHAGOGASTRODUODENOSCOPY (EGD) WITH PROPOFOL N/A 10/14/2021   Procedure: ESOPHAGOGASTRODUODENOSCOPY (EGD) WITH PROPOFOL;  Surgeon: [name], MD;  Location: [facility] ENDOSCOPY;  Service: Endoscopy;  Laterality: N/A;   ESOPHAGOGASTRODUODENOSCOPY (EGD) WITH  PROPOFOL N/A 02/01/2022   Procedure: ESOPHAGOGASTRODUODENOSCOPY (EGD) WITH PROPOFOL;  Surgeon: Lin Landsman, MD;  Location: [facility] ENDOSCOPY;  Service: Gastroenterology;  Laterality: N/A;   GIVENS CAPSULE STUDY N/A 06/22/2021   Procedure: GIVENS CAPSULE STUDY;  Surgeon: Toledo, Benay Pike, MD;  Location: [facility] ENDOSCOPY;  Service: Gastroenterology;  Laterality: N/A;   IR NEPHROSTOMY PLACEMENT LEFT  12/27/2021   IVC FILTER INSERTION N/A 08/18/2020   Procedure: IVC FILTER INSERTION;  Surgeon: Katha Cabal, MD;  Location: [place] [facility];  Service: Cardiovascular;  Laterality: N/A;   PERIPHERAL VASCULAR THROMBECTOMY Bilateral 01/03/2021   Procedure: PERIPHERAL VASCULAR THROMBECTOMY;  Surgeon: Algernon Huxley, MD;  Location: [place] [facility];  Service: Cardiovascular;  Laterality: Bilateral;   PULMONARY THROMBECTOMY N/A 08/18/2020   Procedure: PULMONARY THROMBECTOMY;  Surgeon: Katha Cabal, MD;  Location: [place] [facility];  Service: Cardiovascular;  Laterality: N/A;   TUBAL LIGATION     VISCERAL ANGIOGRAPHY N/A 07/18/2021   Procedure: VISCERAL ANGIOGRAPHY;  Surgeon: Algernon Huxley, MD;  Location: [facility];  Service: Cardiovascular;  Laterality: N/A;    MEDICATIONS:  Prior to Admission medications   Medication Sig Start Date End Date Taking? Authorizing Provider  acetaminophen (TYLENOL) 500 MG tablet Take 500 mg by mouth every 6 (six) hours as needed for mild pain or fever.    [provider]  Ascorbic Acid (VITAMIN C) 1000 MG tablet Take 1,000 mg by mouth daily.    [provider]  BREO ELLIPTA 100-25 MCG/ACT AEPB Inhale 1 puff into the lungs daily. 11/15/21   [provider]  cyanocobalamin 1000 MCG tablet Take 1 tablet (1,000 mcg total) by mouth daily. 02/03/22   Fritzi Mandes, MD  feeding supplement (ENSURE ENLIVE / ENSURE PLUS) LIQD Take 237 mLs by mouth 3 (three) times daily between meals. 02/02/22   Fritzi Mandes, MD  furosemide (LASIX) 20 MG  tablet Hold due to low blood pressure. Patient taking differently: Take 20 mg by mouth daily. Hold due to low blood pressure. 12/31/21   Enzo Bi, MD  loperamide (IMODIUM) 2 MG capsule Take 1 capsule (2 mg total) by mouth as needed for diarrhea or loose stools. 06/26/21   Sidney Ace, MD  Multiple Vitamin (MULTIVITAMIN WITH MINERALS) TABS tablet Take 1 tablet by mouth daily. 09/12/20   [name], MD  pantoprazole (PROTONIX) 40 MG tablet Take 40 mg by mouth every morning.    [provider]  rivaroxaban (XARELTO) 10 MG TABS tablet Hold until followup with urology. 02/02/22   Fritzi Mandes, MD  rosuvastatin (CRESTOR) 10 MG tablet Take 1 tablet (10 mg total) by mouth daily. Patient taking differently: Take 10 mg by mouth every morning. 10/03/20   Earlie Server, MD    Physical Exam   Triage Vital Signs: ED Triage Vitals  Enc Vitals Group     BP 02/23/22 2312 (!) 102/56     Pulse Rate 02/23/22 2312 86     Resp 02/23/22 2312 (!) 28     Temp 02/23/22 2312 98.2 F (36.8 C)     Temp Source 02/23/22 2312 Oral     SpO2 02/23/22 2312 99 %  Weight 02/23/22 2310 175 lb (79.4 kg)     Height 02/23/22 2310 '4\' 11"'$  (1.499 m)     Head Circumference --      Peak Flow --      Pain Score 02/23/22 2310 6     Pain Loc --      Pain Edu? --      Excl. in [place]? --     Most recent vital signs: Vitals:   02/24/22 0230 02/24/22 0300  BP: (!) 96/46 (!) 89/51  Pulse: 83 85  Resp: 20 (!) 22  Temp:    SpO2: 98% 95%    CONSTITUTIONAL: Alert and oriented and responds appropriately to questions.  Obese, chronically ill-appearing, appears pale HEAD: Normocephalic, atraumatic EYES: Conjunctivae clear, pupils appear equal, sclera nonicteric, pale conjunctiva ENT: normal nose; moist mucous membranes NECK: Supple, normal ROM CARD: RRR; S1 and S2 appreciated; no murmurs, no clicks, no rubs, no gallops RESP: Patient is tachypneic.  Her lungs are clear and equal without rhonchi, wheezing or rales  but exam is limited due to body habitus.  No hypoxia. ABD/GI: Normal bowel sounds; non-distended; soft, non-tender, no rebound, no guarding, no peritoneal signs BACK: The back appears normal, nephrostomy tube noted to the left lower back with minimal amount of surrounding redness without bleeding, drainage.  Tube appears to be in place.  Sutures intact. EXT: Significant edema in the bilateral lower extremities to the level of the knee that is pitting.  Extremities warm and well-perfused.  Compartments soft.  She does have calf tenderness on exam bilaterally. SKIN: Normal color for age and race; warm; no rash on exposed skin NEURO: Moves all extremities equally, normal speech, no facial asymmetry PSYCH: The patient's mood and manner are appropriate.   ED Results / Procedures / Treatments   LABS: (all labs ordered are listed, but only abnormal results are displayed) Labs Reviewed  COMPREHENSIVE METABOLIC PANEL - Abnormal; Notable for the following components:      Result Value   Potassium 3.3 (*)    Glucose, Bld 152 (*)    Calcium 8.6 (*)    Albumin 3.2 (*)    All other components within normal limits  D-DIMER, QUANTITATIVE - Abnormal; Notable for the following components:   D-Dimer, Quant 1.00 (*)    All other components within normal limits  CBC WITH DIFFERENTIAL/PLATELET - Abnormal; Notable for the following components:   RBC 2.69 (*)    Hemoglobin 6.8 (*)    HCT 23.1 (*)    MCH 25.3 (*)    MCHC 29.4 (*)    RDW 19.4 (*)    All other components within normal limits  URINALYSIS, ROUTINE W REFLEX MICROSCOPIC - Abnormal; Notable for the following components:   Color, Urine YELLOW (*)    APPearance HAZY (*)    Specific Gravity, Urine >1.046 (*)    Protein, ur 30 (*)    Leukocytes,Ua MODERATE (*)    Bacteria, UA RARE (*)    All other components within normal limits  SARS CORONAVIRUS 2 BY RT PCR  URINE CULTURE  CULTURE, BLOOD (ROUTINE X 2)  CULTURE, BLOOD (ROUTINE X 2)  BRAIN  NATRIURETIC PEPTIDE  PROCALCITONIN  LACTIC ACID, PLASMA  TYPE AND SCREEN  TYPE AND SCREEN  PREPARE RBC (CROSSMATCH)  TROPONIN I (HIGH SENSITIVITY)  TROPONIN I (HIGH SENSITIVITY)     EKG:  EKG Interpretation  Date/Time:  Saturday February 23 2022 23:13:23 EDT Ventricular Rate:  87 PR Interval:  179 QRS Duration: 91 QT  Interval:  367 QTC Calculation: 442 R Axis:   75 Text Interpretation: Sinus rhythm Low voltage, precordial leads Confirmed by Pryor Curia [phone]) on 02/23/2022 11:22:04 PM         EKG Interpretation  Date/Time:  Sunday February 24 2022 00:20:22 EDT Ventricular Rate:  80 PR Interval:  189 QRS Duration: 89 QT Interval:  394 QTC Calculation: 455 R Axis:   73 Text Interpretation: Sinus rhythm Low voltage, precordial leads Confirmed by Pryor Curia [id number]) on 02/24/2022 2:23:31 AM         RADIOLOGY: My personal review and interpretation of imaging: CT scans show chronic PE, developing pneumonia.  Nephrostomy tube in place.  Patient has acute on chronic right lower extremity DVT.  Chronic DVT in the left leg.  I have personally reviewed all radiology reports.   CT Angio Chest PE W and/or Wo Contrast  Result Date: 02/24/2022 CLINICAL DATA:  Pulmonary embolism (PE) suspected, positive D-dimer; Abdominal pain, acute, nonlocalized back pain, nephromstomy tube on Left EXAM: CT ANGIOGRAPHY CHEST CT ABDOMEN AND PELVIS WITH CONTRAST TECHNIQUE: Multidetector CT imaging of the chest was performed using the standard protocol during bolus administration of intravenous contrast. Multiplanar CT image reconstructions and MIPs were obtained to evaluate the vascular anatomy. Multidetector CT imaging of the abdomen and pelvis was performed using the standard protocol during bolus administration of intravenous contrast. RADIATION DOSE REDUCTION: This exam was performed according to the departmental dose-optimization program which includes automated exposure control, adjustment of  the mA and/or kV according to patient size and/or use of iterative reconstruction technique. CONTRAST:  6m OMNIPAQUE IOHEXOL 350 MG/ML SOLN COMPARISON:  11/17/2021 FINDINGS: CTA CHEST FINDINGS Cardiovascular: There is adequate opacification of the pulmonary arterial tree. Tiny web identified within the a right lower lobar pulmonary artery in keeping with chronic pulmonary embolus, stable since prior examination. No acute pulmonary embolus identified. Central pulmonary arteries are of normal caliber. Minimal coronary artery calcification. Global cardiac size within normal limits. No pericardial effusion. Mild atherosclerotic calcification within the thoracic aorta. No aortic aneurysm. Mediastinum/Nodes: No enlarged mediastinal, hilar, or axillary lymph nodes. Thyroid gland, trachea, and esophagus demonstrate no significant findings. Lungs/Pleura: Subpleural architectural distortion and interstitial thickening within the a anterior left upper lobe is stable since prior examination in keeping with focal fibrosis. There is diffuse progressive bronchial wall thickening and new extensive airway impaction within the lower lobes bilaterally in keeping with airway inflammation. Extensive endobronchial debris can be seen in the setting of superimposed acute infection or aspiration. Mosaic attenuation of the pulmonary parenchyma is in keeping with multifocal air trapping likely related to small airways disease. No pneumothorax or pleural effusion. Musculoskeletal: No acute bone abnormality. Osseous structures are age-appropriate. Review of the MIP images confirms the above findings. CT ABDOMEN and PELVIS FINDINGS Hepatobiliary: No focal liver abnormality is seen. No gallstones, gallbladder wall thickening, or biliary dilatation. Pancreas: Unremarkable Spleen: Unremarkable Adrenals/Urinary Tract: The adrenal glands are unremarkable. The kidneys are normal in size and position. Left percutaneous nephrostomy catheter has been  placed and is in expected position with its retaining loop formed within the left renal pelvis. Interval removal of left double-J ureteral stent. No hydronephrosis. 3 mm nonobstructing calculus is noted within the lower pole of the right kidney. There is persistent marked circumferential perivesicular inflammatory stranding suggesting an underlying infectious or inflammatory cystitis. The bladder is decompressed. Stomach/Bowel: Stomach is within normal limits. Appendix appears normal. No evidence of bowel wall thickening, distention, or inflammatory changes. Vascular/Lymphatic: Mild aortoiliac atherosclerotic calcification.  Prominent atherosclerotic calcification noted at the origin of the mesenteric and renal vasculature suspected hemodynamically significant stenoses of the celiac axis and superior mesenteric artery proximally. This is not well assessed, however, on this non arteriographic examination. Infrarenal inferior vena cava filter is unchanged in position, fixed just below the confluence of the retroaortic left renal vein. There is marked narrowing of the common iliac and proximal inferior vena cava suggesting chronic thrombosis, unchanged from prior examination. No pathologic adenopathy within the abdomen and pelvis. Reproductive: Uterus and bilateral adnexa are unremarkable. 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 This does appear slightly progressive since prior examination, however. No abdominal wall hernia Musculoskeletal: Advanced degenerative changes are noted within the hips bilaterally. No acute bone abnormality. No lytic or blastic bone lesion. Review of the MIP images confirms the above findings. IMPRESSION: 1. No acute pulmonary embolus. 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 2. Progressive  bronchial wall thickening and extensive airway impaction within the lower lobes bilaterally in keeping with airway inflammation. Extensive endobronchial debris can be seen in the setting of superimposed acute infection or aspiration. 3. Mosaic attenuation of the pulmonary parenchyma in keeping with multifocal air trapping likely related to small airways disease. 4. Minimal coronary artery calcification. 5. Interval removal of left double-J ureteral stent and placement of a left percutaneous nephrostomy catheter in expected position. No hydronephrosis. 6. Minimal nonobstructing right nephrolithiasis. 7. Persistent marked circumferential perivesicular inflammatory stranding suggesting an underlying infectious or inflammatory cystitis. Correlation with urinalysis and urine culture may be helpful. 8. 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 9. Peripheral vascular disease with suspected hemodynamically significant stenoses of the celiac axis and superior mesenteric artery proximally. Clinical correlation for signs and symptoms of chronic mesenteric ischemia recommended. If indicated, this would be better assessed with CT arteriography. 10.  Aortic Atherosclerosis (ICD10-I70.0). Electronically Signed   By: Fidela Salisbury M.D.   On: 02/24/2022 01:21   CT ABDOMEN PELVIS W CONTRAST  Result Date: 02/24/2022 CLINICAL DATA:  Pulmonary embolism (PE) suspected, positive D-dimer; Abdominal pain, acute, nonlocalized back pain, nephromstomy tube on Left EXAM: CT ANGIOGRAPHY CHEST CT ABDOMEN AND PELVIS WITH CONTRAST TECHNIQUE: Multidetector CT imaging of the chest was performed using the standard protocol during bolus administration of intravenous contrast. Multiplanar CT image reconstructions and MIPs were obtained to evaluate  the vascular anatomy. Multidetector CT imaging of the abdomen and pelvis was performed using the standard protocol during bolus administration of intravenous contrast. RADIATION DOSE REDUCTION: This exam was performed according to the departmental dose-optimization program which includes automated exposure control, adjustment of the mA and/or kV according to patient size and/or use of iterative reconstruction technique. CONTRAST:  74m OMNIPAQUE IOHEXOL 350 MG/ML SOLN COMPARISON:  11/17/2021 FINDINGS: CTA CHEST FINDINGS Cardiovascular: There is adequate opacification of the pulmonary arterial tree. Tiny web identified within the a right lower lobar pulmonary artery in keeping with chronic pulmonary embolus, stable since prior examination. No acute pulmonary embolus identified. Central pulmonary arteries are of normal caliber. Minimal coronary artery calcification. Global cardiac size within normal limits. No pericardial effusion. Mild atherosclerotic calcification within the thoracic aorta. No aortic aneurysm. Mediastinum/Nodes: No enlarged mediastinal, hilar, or axillary  lymph nodes. Thyroid gland, trachea, and esophagus demonstrate no significant findings. Lungs/Pleura: Subpleural architectural distortion and interstitial thickening within the a anterior left upper lobe is stable since prior examination in keeping with focal fibrosis. There is diffuse progressive bronchial wall thickening and new extensive airway impaction within the lower lobes bilaterally in keeping with airway inflammation. Extensive endobronchial debris can be seen in the setting of superimposed acute infection or aspiration. Mosaic attenuation of the pulmonary parenchyma is in keeping with multifocal air trapping likely related to small airways disease. No pneumothorax or pleural effusion. Musculoskeletal: No acute bone abnormality. Osseous structures are age-appropriate. Review of the MIP images confirms the above findings. CT ABDOMEN and  PELVIS FINDINGS Hepatobiliary: No focal liver abnormality is seen. No gallstones, gallbladder wall thickening, or biliary dilatation. Pancreas: Unremarkable Spleen: Unremarkable Adrenals/Urinary Tract: The adrenal glands are unremarkable. The kidneys are normal in size and position. Left percutaneous nephrostomy catheter has been placed and is in expected position with its retaining loop formed within the left renal pelvis. Interval removal of left double-J ureteral stent. No hydronephrosis. 3 mm nonobstructing calculus is noted within the lower pole of the right kidney. There is persistent marked circumferential perivesicular inflammatory stranding suggesting an underlying infectious or inflammatory cystitis. The bladder is decompressed. Stomach/Bowel: Stomach is within normal limits. Appendix appears normal. No evidence of bowel wall thickening, distention, or inflammatory changes. Vascular/Lymphatic: Mild aortoiliac atherosclerotic calcification. Prominent atherosclerotic calcification noted at the origin of the mesenteric and renal vasculature suspected hemodynamically significant stenoses of the celiac axis and superior mesenteric artery proximally. This is not well assessed, however, on this non arteriographic examination. Infrarenal inferior vena cava filter is unchanged in position, fixed just below the confluence of the retroaortic left renal vein. There is marked narrowing of the common iliac and proximal inferior vena cava suggesting chronic thrombosis, unchanged from prior examination. No pathologic adenopathy within the abdomen and pelvis. Reproductive: Uterus and bilateral adnexa are unremarkable. 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 This does appear slightly progressive since prior examination, however. No abdominal wall hernia  Musculoskeletal: Advanced degenerative changes are noted within the hips bilaterally. No acute bone abnormality. No lytic or blastic bone lesion. Review of the MIP images confirms the above findings. IMPRESSION: 1. No acute pulmonary embolus. 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 2. Progressive bronchial wall thickening and extensive airway impaction within the lower lobes bilaterally in keeping with airway inflammation. Extensive endobronchial debris can be seen in the setting of superimposed acute infection or aspiration. 3. Mosaic attenuation of the pulmonary parenchyma in keeping with multifocal air trapping likely related to small airways disease. 4. Minimal coronary artery calcification. 5. Interval removal of left double-J ureteral stent and placement of a left percutaneous nephrostomy catheter in expected position. No hydronephrosis. 6. Minimal nonobstructing right nephrolithiasis. 7. Persistent marked circumferential perivesicular inflammatory stranding suggesting an underlying infectious or inflammatory cystitis. Correlation with urinalysis and urine culture may be helpful. 8. 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 9. Peripheral vascular disease with suspected hemodynamically significant stenoses of the celiac axis and superior mesenteric artery proximally. Clinical correlation for signs and symptoms of chronic mesenteric ischemia recommended. If indicated,  this would be better assessed with CT arteriography. 10.  Aortic Atherosclerosis (ICD10-I70.0). Electronically Signed   By: Fidela Salisbury M.D.   On: 02/24/2022 01:21   US Venous Img Lower Bilateral (DVT)  Result Date: 02/24/2022 CLINICAL DATA:  Leg swelling.  History of DVT EXAM: BILATERAL LOWER  EXTREMITY VENOUS DOPPLER ULTRASOUND TECHNIQUE: Gray-scale sonography with graded compression, as well as color Doppler and duplex ultrasound were performed to evaluate the lower extremity deep venous systems from the level of the common femoral vein and including the common femoral, femoral, profunda femoral, popliteal and calf veins including the posterior tibial, peroneal and gastrocnemius veins when visible. The superficial great saphenous vein was also interrogated. Spectral Doppler was utilized to evaluate flow at rest and with distal augmentation maneuvers in the common femoral, femoral and popliteal veins. COMPARISON:  None Available. FINDINGS: RIGHT LOWER EXTREMITY Common Femoral Vein: Again noted is chronic partially occlusive DVT. Partial compressibility. Saphenofemoral Junction: Partially occlusive chronic thrombus noted. Profunda Femoral Vein: No evidence of thrombus. Normal compressibility and flow on color Doppler imaging. Femoral Vein: Partially occlusive chronic thrombus noted in the proximal femoral vein. No flow seen within the mid and distal femoral vein compatible with occlusive thrombosis. Popliteal Vein: No evidence of thrombus. Normal compressibility, respiratory phasicity and response to augmentation. Calf Veins: Not visualized Superficial Great Saphenous Vein: Not visualized Venous Reflux:  Not assessed Other Findings:  None. LEFT LOWER EXTREMITY Common Femoral Vein: Again noted is chronic partially occlusive thrombus. Partial compressibility. Saphenofemoral Junction: Partially occlusive chronic thrombus noted. Profunda Femoral Vein: No evidence of thrombus. Normal compressibility and flow on color Doppler imaging. Femoral Vein: Partially occlusive chronic thrombus again noted. Popliteal Vein: Partially occlusive chronic thrombus again noted. Calf Veins: Not visualized Superficial Great Saphenous Vein: Not visualized Venous Reflux:  Not assessed Other Findings:  None IMPRESSION: Chronic  partially occlusive thrombus noted within the right common femoral vein and proximal superficial femoral vein. Occlusive thrombus noted in the mid to distal right femoral vein. This was partially occluded previously suggesting an acute component. Chronic partially occlusive thrombus in the left common femoral vein, superficial femoral vein and popliteal vein. Electronically Signed   By: Rolm Baptise M.D.   On: 02/24/2022 00:43   DG Chest Port 1 View  Result Date: 02/23/2022 CLINICAL DATA:  Dyspnea EXAM: PORTABLE CHEST 1 VIEW COMPARISON:  None Available. FINDINGS: The heart size and mediastinal contours are within normal limits. Both lungs are clear. The visualized skeletal structures are unremarkable. IMPRESSION: No active disease. Electronically Signed   By: Fidela Salisbury M.D.   On: 02/23/2022 23:52     PROCEDURES:  Critical Care performed: Yes, see critical care procedure note(s)   CRITICAL CARE Performed by: Cyril Mourning Jamal Haskin   Total critical care time: 50 minutes  Critical care time was exclusive of separately billable procedures and treating other patients.  Critical care was necessary to treat or prevent imminent or life-threatening deterioration.  Critical care was time spent personally by me on the following activities: development of treatment plan with patient and/or surrogate as well as nursing, discussions with consultants, evaluation of patient's response to treatment, examination of patient, obtaining history from patient or surrogate, ordering and performing treatments and interventions, ordering and review of laboratory studies, ordering and review of radiographic studies, pulse oximetry and re-evaluation of patient's condition.   Marland Kitchen1-3 Lead EKG Interpretation  Performed by: Tiny Rietz, Delice Bison, DO Authorized by: Aylanie Cubillos, Delice Bison, DO     Interpretation: normal     ECG  rate:  90   ECG rate assessment: normal     Rhythm: sinus rhythm     Ectopy: none     Conduction: normal        IMPRESSION / MDM / ASSESSMENT AND PLAN / ED COURSE  I reviewed the triage vital signs and the nursing notes.    Patient here with multiple complaints.  Complaining of chest pain, shortness of breath, headache, back pain.  Intermittently hypotensive here.  Recent admission for GI bleed and symptomatic anemia requiring transfusion.  The patient is on the cardiac monitor to evaluate for evidence of arrhythmia and/or significant heart rate changes.   DIFFERENTIAL DIAGNOSIS (includes but not limited to):   ACS, PE, dissection, CHF, COPD, pneumonia, bacteremia, sepsis, UTI, kidney stone, pyelonephritis, anemia, electrolyte derangement, dehydration   Patient's presentation is most consistent with acute presentation with potential threat to life or bodily function.   PLAN: We will obtain CBC, CMP, lactic, procalcitonin, troponin x2, D-dimer, chest x-ray.  EKG is nonischemic.  Rectal temp of 98.8.  No infectious symptoms but does have slightly low blood pressures.  She does appear slightly volume overloaded with edema in her legs but this also could be from possible DVT.  Will obtain bilateral venous Dopplers, BNP.  Anticipate admission.   MEDICATIONS GIVEN IN ED: Medications  ceFEPIme (MAXIPIME) 2 g in sodium chloride 0.9 % 100 mL IVPB (2 g Intravenous New Bag/Given 02/24/22 0254)  metroNIDAZOLE (FLAGYL) IVPB 500 mg (500 mg Intravenous New Bag/Given 02/24/22 0254)  0.9 %  sodium chloride infusion (10 mL/hr Intravenous New Bag/Given 02/24/22 0058)  fentaNYL (SUBLIMAZE) injection 50 mcg (50 mcg Intravenous Given 02/24/22 0012)  ondansetron (ZOFRAN) injection 4 mg (4 mg Intravenous Given 02/24/22 0011)  iohexol (OMNIPAQUE) 350 MG/ML injection 75 mL (75 mLs Intravenous Contrast Given 02/24/22 0034)  sodium chloride 0.9 % bolus 500 mL (0 mLs Intravenous Stopped 02/24/22 0209)  potassium chloride SA (KLOR-CON M) CR tablet 40 mEq (40 mEq Oral Given 02/24/22 0250)     ED COURSE: Patient's labs show  hemoglobin of 6.8.  She is slightly guaiac positive here but has brown appearing stool.  Will transfuse 1 unit of packed red blood cells and in the meantime give a 500 mL fluid bolus for the hypotension.  Electrolytes otherwise unremarkable other than potassium level of 3.3.  Will give replacement.  Troponin negative.  BNP is normal.  Chest x-ray reviewed and interpreted by myself and the radiologist and shows no acute abnormality.  D-dimer positive.  We will proceed with CTA of the chest.    Patient is COVID-negative.  Urine is still pending.  I did add on a procalcitonin that is pending.  Given she is also complaining of back pain and has nephrostomy tube, will obtain CT of abdomen pelvis to ensure appropriate placement and to make sure there is no developing abscess or pyelonephritis.   Nurse informed me that patient became hypoxic in the 80s after receiving fentanyl.  On reevaluation, lungs are still clear to auscultation and she is awake and sitting upright.  We will continue to monitor.  Repeat EKG is unchanged.    Patient CT scans reviewed and interpreted by myself and the radiologist.  She has no acute PE.  She has stable minimal chronic PE in the right lower lobar pulmonary artery.  She has bronchial wall thickening, airway impaction concerning for airway inflammation and debris within the lungs concerning for superimposed infection or aspiration.  Given her new onset hypoxia and  complaints of chest pain and shortness of breath, will treat this pneumonia with cefepime and Flagyl to cover for care associated pneumonia given admission on 01/29/2022 and also for possible aspiration given appearance on imaging.  Left percutaneous nephrostomy tube in expected position.  She does have some perivesicular inflammation concerning for cystitis.  She has chronic thrombus in the common iliac and proximal inferior vena cava.  CT also concerning for possible chronic mesenteric ischemia but she has no abdominal  pain currently.  We will add on a lactic.  Venous Doppler reviewed and interpreted by myself and the radiologist and shows chronic partially occlusive thrombus in the right common femoral vein and proximal superficial femoral vein.  There is occlusive thrombus in the mid to distal right femoral vein that was only partially occluded previously so now suggest an acute component.  She also has a chronic partially occlusive thrombus in the left common femoral vein, superficial femoral vein and popliteal vein.  This is likely the cause of her leg swelling.  No sign of cerulea dolens on exam.  Given she is guaiac positive and anemic, will hold on anticoagulation at this time and discussed with the hospitalist.  Lactic normal which makes my suspicion for mesenteric ischemia lower.  I do not feel she needs a CT angio of her abdomen at this time.   CONSULTS:  Consulted and discussed patient's case with hospitalist, Dr. Damita Dunnings.  I have recommended admission and consulting physician agrees and will place admission orders.  Patient (and family if present) agree with this plan.   I reviewed all nursing notes, vitals, pertinent previous records.  All labs, EKGs, imaging ordered have been independently reviewed and interpreted by myself.    OUTSIDE RECORDS REVIEWED: Reviewed patient's last admission on 01/29/2022.       FINAL CLINICAL IMPRESSION(S) / ED DIAGNOSES   Final diagnoses:  Symptomatic anemia  Chest pain, unspecified type  Acute respiratory failure with hypoxia (HCC)  HCAP (healthcare-associated pneumonia)  Acute deep vein thrombosis (DVT) of femoral vein of right lower extremity (HCC)  Hypotension, unspecified hypotension type     Rx / DC Orders   ED Discharge Orders     None        Note:  This document was prepared using Dragon voice recognition software and may include unintentional dictation errors.   Damyon Mullane, Delice Bison, DO 02/24/22 [phone]

## 2022-02-24 NOTE — Progress Notes (Signed)
The patient is a 66 yr old woman who presented to [facility] on 02/23/2022 with complaints of lightheadedness, headache, shortness of breath and worsening of her chronic lower extremity edema. She was found to have a low blood pressure with systolics in the 62-83 range. Her hemoglobin was 6.8, potassium was 3.3, and she was guaiac positive.   Lower extremity doppler demonstrated acute on chronic left common femoral vein DVT and a chronic partially occlusive thrombus right common femoral vein and proximal superficial femoral vein.  CTA chest and abdomen demonstrated no acute pulmonary embolus. There was stable minimal chronic pulmonary embolus in right lower lobar pulmonary artery. There was evidence for extensive bronchitis and airway impaction in the lower lobes bilaterally with endobronchial debris suggesting superimposed infection or aspiration. There is also evidence for small airway disease. There is a left percutaneous nephrostomy catheter in expected position. There is no hydronephrosis. There was suggestion of cystitis with marked circumferential stranding. 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 This may be related to progressive subcutaneous body wall edema. There is also peripheral vascular disease with hemodynamically significantly stenosis of the celiac axis and superior mesenteric artery proximally. Suggestion is to correlate clinically for signs and symptoms of chronic mesenteric ischemia. Suggestion is also for CT arteriography.  The patient was admitted to a telemetry bed by my colleague, Dr. Damita Dunnings.  The patient will receive 1 unit of PRBC's in transfusion. FOBT was positive.   I have seen and examined this patient myself. I have reviewed her labwork, imagery, and vitals. The patient is pale. She is awake, alert, and oriented x 3. No acute distress. Heart and lung exam is within normal  limits. Abdomen is soft, non-tender, non-distended. Extremities are positive for 3+ pitting edema bilaterally. Neurological: The patient is awake, alert, and oriented x 3. She is moving all extremities.

## 2022-02-24 NOTE — Assessment & Plan Note (Addendum)
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 Etiology suspect GI given positive stool guaiac in the ED, lower suspicion for GU etiology given clear urine in nephrostomy bag Continue transfusion of 1 unit PRBC started in the emergency room. Post transfusion hemoglobin was 8.0. Serial H&H and transfuse as necessary Hold Xarelto IV Protonix. Consider GI consult.  Consider urology consult

## 2022-02-24 NOTE — H&P (Signed)
History and Physical    Patient: Courtney Mack [id number]:[id number] DOB: 23-May-1956 DOA: 02/23/2022 DOS: the patient was seen and examined on 02/24/2022 PCP: [facility] [place]  Patient coming from: Home  Chief Complaint:  Chief Complaint  Patient presents with   Leg Swelling   Shortness of Breath    HPI: Courtney Mack is a 66 y.o. female with medical history significant for Recurrent DVT and PE on Xarelto s/p IVC filter, HTN, endometrial cancer, radiation cystitis s/p fulguration with intermittent hematuria requiring CBI, lymphedema, left nephrostomy tube since 12/27/2021 secondary to left hydronephrosis with ureteral stricture recently hospitalized from 7/25 to 7/29 with acute on chronic blood loss anemia requiring 3 units for hemoglobin of 4.7, with GAVE seen on EGD treated with argon plasma coagulation and resumed on Xarelto, who presents to the ED with a complaint of lightheadedness, headache, shortness of breath and worsening of her chronic lower extremity edema. ED course and data review: BP 102/56 with pulse 86 respirations 28 and O2 sat 99% on room air and afebrile.  SBP remaining soft in the ED fluctuating between 89 and 96. Labs: Hemoglobin 6.8.  WBC 5.3 with lactic acid 0.7.  Potassium 3.3.  Procalcitonin less than 0.1, BNP 51 troponin 5.  Guaiac positive EKG, personally viewed and interpreted showing sinus at 80 with no acute ST-T wave changes  Imaging: Lower extremity Doppler suggesting acute on chronic thrombus right common femoral as follows: IMPRESSION: Chronic partially occlusive thrombus noted within the right common femoral vein and proximal superficial femoral vein. Occlusive thrombus noted in the mid to distal right femoral vein. This was partially occluded previously suggesting an acute component. Chronic partially occlusive thrombus in the left common femoral vein, superficial femoral vein and popliteal vein.  CTA chest and abdomen showing  the following IMPRESSION: 1. No acute pulmonary embolus. 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 2. Progressive bronchial wall thickening and extensive airway impaction within the lower lobes bilaterally in keeping with airway inflammation. Extensive endobronchial debris can be seen in the setting of superimposed acute infection or aspiration. 3. Mosaic attenuation of the pulmonary parenchyma in keeping with multifocal air trapping likely related to small airways disease. 4. Minimal coronary artery calcification. 5. Interval removal of left double-J ureteral stent and placement of a left percutaneous nephrostomy catheter in expected position. No hydronephrosis. 6. Minimal nonobstructing right nephrolithiasis. 7. Persistent marked circumferential perivesicular inflammatory stranding suggesting an underlying infectious or inflammatory cystitis. Correlation with urinalysis and urine culture may be helpful. 8. 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 9. Peripheral vascular disease with suspected hemodynamically significant stenoses of the celiac axis and superior mesenteric artery proximally. Clinical correlation for signs and symptoms of chronic mesenteric ischemia recommended. If indicated, this would be better assessed with CT arteriography. 10.  Aortic Atherosclerosis (ICD10-I70.0).   Patient was started on Maxipime and Flagyl and given an IV for fluid bolus.  Also ordered to transfuse 1 unit.  Hospitalist consulted for admission.     Past Medical History:  Diagnosis Date   Acute bilateral deep vein thrombosis (DVT) of femoral veins ([place]) 01/02/2021   Acute massive pulmonary embolism ([place]) 08/25/2020   Last Assessment & Plan:   Formatting of this note might be different from the original. Post vena caval filter and  on xarelto and O2   Anemia    ARF (acute respiratory failure) (HCC)    Cellulitis of left lower leg 06/20/2021   CHF (congestive heart failure) ([place])    COVID-19    Diabetes mellitus without complication ([place])    DVT (deep venous thrombosis) ([place]) 09/06/2020   Endometrial cancer (HCC)    GAVE (gastric antral vascular ectasia)    GERD (gastroesophageal reflux disease)    High cholesterol    Hx of blood clots    Hyperlipidemia    Hypertension    IDA (iron deficiency anemia) 09/21/2020   Obesity    Pressure injury of skin 06/21/2021   Pulmonary embolism ([place])    Sepsis ([place])    Thrombocytopenia ([place])    Urinary tract infection 09/13/2021   Past Surgical History:  Procedure Laterality Date   COLONOSCOPY N/A 06/21/2021   Procedure: COLONOSCOPY;  Surgeon: Toledo, Benay Pike, MD;  Location: [facility] ENDOSCOPY;  Service: Gastroenterology;  Laterality: N/A;   CYSTOSCOPY W/ URETERAL STENT PLACEMENT Left 09/11/2021   Procedure: CYSTOSCOPY WITH RETROGRADE PYELOGRAM/URETERAL STENT PLACEMENT;  Surgeon: Abbie Sons, MD;  Location: [facility] ORS;  Service: Urology;  Laterality: Left;   CYSTOSCOPY W/ URETERAL STENT PLACEMENT Left 11/17/2021   Procedure: CYSTOSCOPY WITH STENT REPLACEMENT;  Surgeon: Irine Seal, MD;  Location: [facility] ORS;  Service: Urology;  Laterality: Left;   CYSTOSCOPY WITH FULGERATION  11/17/2021   Procedure: CYSTOSCOPY WITH FULGERATION;  Surgeon: Irine Seal, MD;  Location: [facility] ORS;  Service: Urology;;   CYSTOSCOPY WITH STENT PLACEMENT Left 10/17/2021   Procedure: CYSTOSCOPY WITH STENT PLACEMENT;  Surgeon: Abbie Sons, MD;  Location: [facility] ORS;  Service: Urology;  Laterality: Left;   CYSTOSCOPY WITH URETEROSCOPY, STONE BASKETRY AND STENT PLACEMENT Left 10/16/2021   Procedure: CYSTOSCOPY WITH URETEROSCOPY  AND STENT REMOVAL;  Surgeon: Abbie Sons, MD;  Location: [facility] ORS;  Service: Urology;   Laterality: Left;   ENTEROSCOPY N/A 08/17/2021   Procedure: ENTEROSCOPY;  Surgeon: Lin Landsman, MD;  Location: [facility] ENDOSCOPY;  Service: Gastroenterology;  Laterality: N/A;   ESOPHAGOGASTRODUODENOSCOPY N/A 06/21/2021   Procedure: ESOPHAGOGASTRODUODENOSCOPY (EGD);  Surgeon: Toledo, Benay Pike, MD;  Location: [facility] ENDOSCOPY;  Service: Gastroenterology;  Laterality: N/A;   ESOPHAGOGASTRODUODENOSCOPY (EGD) WITH PROPOFOL N/A 08/17/2021   Procedure: ESOPHAGOGASTRODUODENOSCOPY (EGD) WITH PROPOFOL;  Surgeon: Lin Landsman, MD;  Location: [facility] ENDOSCOPY;  Service: Gastroenterology;  Laterality: N/A;   ESOPHAGOGASTRODUODENOSCOPY (EGD) WITH PROPOFOL N/A 10/14/2021   Procedure: ESOPHAGOGASTRODUODENOSCOPY (EGD) WITH PROPOFOL;  Surgeon: [name], MD;  Location: [facility] ENDOSCOPY;  Service: Endoscopy;  Laterality: N/A;   ESOPHAGOGASTRODUODENOSCOPY (EGD) WITH PROPOFOL N/A 02/01/2022   Procedure: ESOPHAGOGASTRODUODENOSCOPY (EGD) WITH PROPOFOL;  Surgeon: Lin Landsman, MD;  Location: [facility] ENDOSCOPY;  Service: Gastroenterology;  Laterality: N/A;   GIVENS CAPSULE STUDY N/A 06/22/2021   Procedure: GIVENS CAPSULE STUDY;  Surgeon: Toledo, Benay Pike, MD;  Location: [facility] ENDOSCOPY;  Service: Gastroenterology;  Laterality: N/A;   IR NEPHROSTOMY PLACEMENT LEFT  12/27/2021   IVC FILTER INSERTION N/A 08/18/2020   Procedure: IVC FILTER INSERTION;  Surgeon: Katha Cabal, MD;  Location: [place] CV LAB;  Service: Cardiovascular;  Laterality: N/A;   PERIPHERAL VASCULAR THROMBECTOMY Bilateral 01/03/2021   Procedure: PERIPHERAL VASCULAR THROMBECTOMY;  Surgeon: Algernon Huxley, MD;  Location: [place] CV LAB;  Service: Cardiovascular;  Laterality: Bilateral;   PULMONARY THROMBECTOMY N/A 08/18/2020   Procedure: PULMONARY THROMBECTOMY;  Surgeon: Katha Cabal, MD;  Location: [place] CV LAB;  Service: Cardiovascular;  Laterality: N/A;  TUBAL LIGATION     VISCERAL ANGIOGRAPHY N/A 07/18/2021    Procedure: VISCERAL ANGIOGRAPHY;  Surgeon: Algernon Huxley, MD;  Location: [place] CV LAB;  Service: Cardiovascular;  Laterality: N/A;   Social History:  reports that she has never smoked. She has never used smokeless tobacco. She reports that she does not currently use alcohol. She reports that she does not currently use drugs.  No Known Allergies  Family History  Problem Relation Age of Onset   Hypertension Mother    Diabetes Mother    Diabetes Father    Hypertension Father    High Cholesterol Father    Congestive Heart Failure Father    Breast cancer Cousin     Prior to Admission medications   Medication Sig Start Date End Date Taking? Authorizing Provider  acetaminophen (TYLENOL) 500 MG tablet Take 500 mg by mouth every 6 (six) hours as needed for mild pain or fever.    [provider]  Ascorbic Acid (VITAMIN C) 1000 MG tablet Take 1,000 mg by mouth daily.    [provider]  BREO ELLIPTA 100-25 MCG/ACT AEPB Inhale 1 puff into the lungs daily. 11/15/21   [provider]  cyanocobalamin 1000 MCG tablet Take 1 tablet (1,000 mcg total) by mouth daily. 02/03/22   Fritzi Mandes, MD  feeding supplement (ENSURE ENLIVE / ENSURE PLUS) LIQD Take 237 mLs by mouth 3 (three) times daily between meals. 02/02/22   Fritzi Mandes, MD  furosemide (LASIX) 20 MG tablet Hold due to low blood pressure. Patient taking differently: Take 20 mg by mouth daily. Hold due to low blood pressure. 12/31/21   Enzo Bi, MD  loperamide (IMODIUM) 2 MG capsule Take 1 capsule (2 mg total) by mouth as needed for diarrhea or loose stools. 06/26/21   Sidney Ace, MD  Multiple Vitamin (MULTIVITAMIN WITH MINERALS) TABS tablet Take 1 tablet by mouth daily. 09/12/20   [name], MD  pantoprazole (PROTONIX) 40 MG tablet Take 40 mg by mouth every morning.    [provider]  rivaroxaban (XARELTO) 10 MG TABS tablet Hold until followup with urology. 02/02/22   Fritzi Mandes, MD   rosuvastatin (CRESTOR) 10 MG tablet Take 1 tablet (10 mg total) by mouth daily. Patient taking differently: Take 10 mg by mouth every morning. 10/03/20   Earlie Server, MD    Physical Exam: Vitals:   02/24/22 0130 02/24/22 0200 02/24/22 0230 02/24/22 0300  BP: (!) 96/48 (!) 90/47 (!) 96/46 (!) 89/51  Pulse: 90 87 83 85  Resp: (!) 22 (!) 21 20 (!) 22  Temp:      TempSrc:      SpO2: 96% 97% 98% 95%  Weight:      Height:       Physical Exam Constitutional:      Appearance: She is ill-appearing.  HENT:     Mouth/Throat:     Mouth: Mucous membranes are dry.  Musculoskeletal:     Right lower leg: Edema present.     Left lower leg: Edema present.  Skin:    Coloration: Skin is pale.  Neurological:     General: No focal deficit present.     Mental Status: She is oriented to person, place, and time.     Labs on Admission: I have personally reviewed following labs and imaging studies  CBC: Recent Labs  Lab 02/23/22 2327  WBC 5.3  NEUTROABS 3.5  HGB 6.8*  HCT 23.1*  MCV 85.9  PLT 288  Basic Metabolic Panel: Recent Labs  Lab 02/23/22 2327  NA 139  K 3.3*  CL 105  CO2 27  GLUCOSE 152*  BUN 18  CREATININE 0.89  CALCIUM 8.6*   GFR: Estimated Creatinine Clearance: 55.9 mL/min (by C-G formula based on SCr of 0.89 mg/dL). Liver Function Tests: Recent Labs  Lab 02/23/22 2327  AST 22  ALT 11  ALKPHOS 41  BILITOT 0.7  PROT 6.7  ALBUMIN 3.2*   No results for input(s): "LIPASE", "AMYLASE" in the last 168 hours. No results for input(s): "AMMONIA" in the last 168 hours. Coagulation Profile: No results for input(s): "INR", "PROTIME" in the last 168 hours. Cardiac Enzymes: No results for input(s): "CKTOTAL", "CKMB", "CKMBINDEX", "TROPONINI" in the last 168 hours. BNP (last 3 results) No results for input(s): "PROBNP" in the last 8760 hours. HbA1C: No results for input(s): "HGBA1C" in the last 72 hours. CBG: No results for input(s): "GLUCAP" in the last 168  hours. Lipid Profile: No results for input(s): "CHOL", "HDL", "LDLCALC", "TRIG", "CHOLHDL", "LDLDIRECT" in the last 72 hours. Thyroid Function Tests: No results for input(s): "TSH", "T4TOTAL", "FREET4", "T3FREE", "THYROIDAB" in the last 72 hours. Anemia Panel: No results for input(s): "VITAMINB12", "FOLATE", "FERRITIN", "TIBC", "IRON", "RETICCTPCT" in the last 72 hours. Urine analysis:    Component Value Date/Time   COLORURINE YELLOW (A) 02/24/2022 0201   APPEARANCEUR HAZY (A) 02/24/2022 0201   APPEARANCEUR Cloudy (A) 01/03/2022 1443   LABSPEC >1.046 (H) 02/24/2022 0201   PHURINE 5.0 02/24/2022 0201   GLUCOSEU NEGATIVE 02/24/2022 0201   HGBUR NEGATIVE 02/24/2022 0201   BILIRUBINUR NEGATIVE 02/24/2022 0201   BILIRUBINUR Negative 01/03/2022 1443   KETONESUR NEGATIVE 02/24/2022 0201   PROTEINUR 30 (A) 02/24/2022 0201   NITRITE NEGATIVE 02/24/2022 0201   LEUKOCYTESUR MODERATE (A) 02/24/2022 0201    Radiological Exams on Admission: CT Angio Chest PE W and/or Wo Contrast  Result Date: 02/24/2022 CLINICAL DATA:  Pulmonary embolism (PE) suspected, positive D-dimer; Abdominal pain, acute, nonlocalized back pain, nephromstomy tube on Left EXAM: CT ANGIOGRAPHY CHEST CT ABDOMEN AND PELVIS WITH CONTRAST TECHNIQUE: Multidetector CT imaging of the chest was performed using the standard protocol during bolus administration of intravenous contrast. Multiplanar CT image reconstructions and MIPs were obtained to evaluate the vascular anatomy. Multidetector CT imaging of the abdomen and pelvis was performed using the standard protocol during bolus administration of intravenous contrast. RADIATION DOSE REDUCTION: This exam was performed according to the departmental dose-optimization program which includes automated exposure control, adjustment of the mA and/or kV according to patient size and/or use of iterative reconstruction technique. CONTRAST:  46m OMNIPAQUE IOHEXOL 350 MG/ML SOLN COMPARISON:   11/17/2021 FINDINGS: CTA CHEST FINDINGS Cardiovascular: There is adequate opacification of the pulmonary arterial tree. Tiny web identified within the a right lower lobar pulmonary artery in keeping with chronic pulmonary embolus, stable since prior examination. No acute pulmonary embolus identified. Central pulmonary arteries are of normal caliber. Minimal coronary artery calcification. Global cardiac size within normal limits. No pericardial effusion. Mild atherosclerotic calcification within the thoracic aorta. No aortic aneurysm. Mediastinum/Nodes: No enlarged mediastinal, hilar, or axillary lymph nodes. Thyroid gland, trachea, and esophagus demonstrate no significant findings. Lungs/Pleura: Subpleural architectural distortion and interstitial thickening within the a anterior left upper lobe is stable since prior examination in keeping with focal fibrosis. There is diffuse progressive bronchial wall thickening and new extensive airway impaction within the lower lobes bilaterally in keeping with airway inflammation. Extensive endobronchial debris can be seen in the setting of superimposed acute  infection or aspiration. Mosaic attenuation of the pulmonary parenchyma is in keeping with multifocal air trapping likely related to small airways disease. No pneumothorax or pleural effusion. Musculoskeletal: No acute bone abnormality. Osseous structures are age-appropriate. Review of the MIP images confirms the above findings. CT ABDOMEN and PELVIS FINDINGS Hepatobiliary: No focal liver abnormality is seen. No gallstones, gallbladder wall thickening, or biliary dilatation. Pancreas: Unremarkable Spleen: Unremarkable Adrenals/Urinary Tract: The adrenal glands are unremarkable. The kidneys are normal in size and position. Left percutaneous nephrostomy catheter has been placed and is in expected position with its retaining loop formed within the left renal pelvis. Interval removal of left double-J ureteral stent. No  hydronephrosis. 3 mm nonobstructing calculus is noted within the lower pole of the right kidney. There is persistent marked circumferential perivesicular inflammatory stranding suggesting an underlying infectious or inflammatory cystitis. The bladder is decompressed. Stomach/Bowel: Stomach is within normal limits. Appendix appears normal. No evidence of bowel wall thickening, distention, or inflammatory changes. Vascular/Lymphatic: Mild aortoiliac atherosclerotic calcification. Prominent atherosclerotic calcification noted at the origin of the mesenteric and renal vasculature suspected hemodynamically significant stenoses of the celiac axis and superior mesenteric artery proximally. This is not well assessed, however, on this non arteriographic examination. Infrarenal inferior vena cava filter is unchanged in position, fixed just below the confluence of the retroaortic left renal vein. There is marked narrowing of the common iliac and proximal inferior vena cava suggesting chronic thrombosis, unchanged from prior examination. No pathologic adenopathy within the abdomen and pelvis. Reproductive: Uterus and bilateral adnexa are unremarkable. 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 This does appear slightly progressive since prior examination, however. No abdominal wall hernia Musculoskeletal: Advanced degenerative changes are noted within the hips bilaterally. No acute bone abnormality. No lytic or blastic bone lesion. Review of the MIP images confirms the above findings. IMPRESSION: 1. No acute pulmonary embolus. 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 2. Progressive bronchial wall thickening and extensive airway impaction within the lower lobes bilaterally in keeping with airway inflammation. Extensive  endobronchial debris can be seen in the setting of superimposed acute infection or aspiration. 3. Mosaic attenuation of the pulmonary parenchyma in keeping with multifocal air trapping likely related to small airways disease. 4. Minimal coronary artery calcification. 5. Interval removal of left double-J ureteral stent and placement of a left percutaneous nephrostomy catheter in expected position. No hydronephrosis. 6. Minimal nonobstructing right nephrolithiasis. 7. Persistent marked circumferential perivesicular inflammatory stranding suggesting an underlying infectious or inflammatory cystitis. Correlation with urinalysis and urine culture may be helpful. 8. 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 9. Peripheral vascular disease with suspected hemodynamically significant stenoses of the celiac axis and superior mesenteric artery proximally. Clinical correlation for signs and symptoms of chronic mesenteric ischemia recommended. If indicated, this would be better assessed with CT arteriography. 10.  Aortic Atherosclerosis (ICD10-I70.0). Electronically Signed   By: Fidela Salisbury M.D.   On: 02/24/2022 01:21   CT ABDOMEN PELVIS W CONTRAST  Result Date: 02/24/2022 CLINICAL DATA:  Pulmonary embolism (PE) suspected, positive D-dimer; Abdominal pain, acute, nonlocalized back pain, nephromstomy tube on Left EXAM: CT ANGIOGRAPHY CHEST CT ABDOMEN AND PELVIS WITH CONTRAST TECHNIQUE: Multidetector  CT imaging of the chest was performed using the standard protocol during bolus administration of intravenous contrast. Multiplanar CT image reconstructions and MIPs were obtained to evaluate the vascular anatomy. Multidetector CT imaging of the abdomen and pelvis was performed using the standard protocol during bolus  administration of intravenous contrast. RADIATION DOSE REDUCTION: This exam was performed according to the departmental dose-optimization program which includes automated exposure control, adjustment of the mA and/or kV according to patient size and/or use of iterative reconstruction technique. CONTRAST:  60m OMNIPAQUE IOHEXOL 350 MG/ML SOLN COMPARISON:  11/17/2021 FINDINGS: CTA CHEST FINDINGS Cardiovascular: There is adequate opacification of the pulmonary arterial tree. Tiny web identified within the a right lower lobar pulmonary artery in keeping with chronic pulmonary embolus, stable since prior examination. No acute pulmonary embolus identified. Central pulmonary arteries are of normal caliber. Minimal coronary artery calcification. Global cardiac size within normal limits. No pericardial effusion. Mild atherosclerotic calcification within the thoracic aorta. No aortic aneurysm. Mediastinum/Nodes: No enlarged mediastinal, hilar, or axillary lymph nodes. Thyroid gland, trachea, and esophagus demonstrate no significant findings. Lungs/Pleura: Subpleural architectural distortion and interstitial thickening within the a anterior left upper lobe is stable since prior examination in keeping with focal fibrosis. There is diffuse progressive bronchial wall thickening and new extensive airway impaction within the lower lobes bilaterally in keeping with airway inflammation. Extensive endobronchial debris can be seen in the setting of superimposed acute infection or aspiration. Mosaic attenuation of the pulmonary parenchyma is in keeping with multifocal air trapping likely related to small airways disease. No pneumothorax or pleural effusion. Musculoskeletal: No acute bone abnormality. Osseous structures are age-appropriate. Review of the MIP images confirms the above findings. CT ABDOMEN and PELVIS FINDINGS Hepatobiliary: No focal liver abnormality is seen. No gallstones, gallbladder wall thickening, or biliary  dilatation. Pancreas: Unremarkable Spleen: Unremarkable Adrenals/Urinary Tract: The adrenal glands are unremarkable. The kidneys are normal in size and position. Left percutaneous nephrostomy catheter has been placed and is in expected position with its retaining loop formed within the left renal pelvis. Interval removal of left double-J ureteral stent. No hydronephrosis. 3 mm nonobstructing calculus is noted within the lower pole of the right kidney. There is persistent marked circumferential perivesicular inflammatory stranding suggesting an underlying infectious or inflammatory cystitis. The bladder is decompressed. Stomach/Bowel: Stomach is within normal limits. Appendix appears normal. No evidence of bowel wall thickening, distention, or inflammatory changes. Vascular/Lymphatic: Mild aortoiliac atherosclerotic calcification. Prominent atherosclerotic calcification noted at the origin of the mesenteric and renal vasculature suspected hemodynamically significant stenoses of the celiac axis and superior mesenteric artery proximally. This is not well assessed, however, on this non arteriographic examination. Infrarenal inferior vena cava filter is unchanged in position, fixed just below the confluence of the retroaortic left renal vein. There is marked narrowing of the common iliac and proximal inferior vena cava suggesting chronic thrombosis, unchanged from prior examination. No pathologic adenopathy within the abdomen and pelvis. Reproductive: Uterus and bilateral adnexa are unremarkable. 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 This does appear slightly progressive since prior examination, however. No abdominal wall hernia Musculoskeletal: Advanced degenerative changes are noted within the hips bilaterally. No acute bone abnormality. No lytic or blastic  bone lesion. Review of the MIP images confirms the above findings. IMPRESSION: 1. No acute pulmonary embolus. 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 2. Progressive bronchial  wall thickening and extensive airway impaction within the lower lobes bilaterally in keeping with airway inflammation. Extensive endobronchial debris can be seen in the setting of superimposed acute infection or aspiration. 3. Mosaic attenuation of the pulmonary parenchyma in keeping with multifocal air trapping likely related to small airways disease. 4. Minimal coronary artery calcification. 5. Interval removal of left double-J ureteral stent and placement of a left percutaneous nephrostomy catheter in expected position. No hydronephrosis. 6. Minimal nonobstructing right nephrolithiasis. 7. Persistent marked circumferential perivesicular inflammatory stranding suggesting an underlying infectious or inflammatory cystitis. Correlation with urinalysis and urine culture may be helpful. 8. 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 9. Peripheral vascular disease with suspected hemodynamically significant stenoses of the celiac axis and superior mesenteric artery proximally. Clinical correlation for signs and symptoms of chronic mesenteric ischemia recommended. If indicated, this would be better assessed with CT arteriography. 10.  Aortic Atherosclerosis (ICD10-I70.0). Electronically Signed   By: Fidela Salisbury M.D.   On: 02/24/2022 01:21   US Venous Img Lower Bilateral (DVT)  Result Date: 02/24/2022 CLINICAL DATA:  Leg swelling.  History of DVT EXAM: BILATERAL LOWER EXTREMITY VENOUS DOPPLER ULTRASOUND TECHNIQUE: Gray-scale sonography with graded compression, as well as color Doppler and duplex  ultrasound were performed to evaluate the lower extremity deep venous systems from the level of the common femoral vein and including the common femoral, femoral, profunda femoral, popliteal and calf veins including the posterior tibial, peroneal and gastrocnemius veins when visible. The superficial great saphenous vein was also interrogated. Spectral Doppler was utilized to evaluate flow at rest and with distal augmentation maneuvers in the common femoral, femoral and popliteal veins. COMPARISON:  None Available. FINDINGS: RIGHT LOWER EXTREMITY Common Femoral Vein: Again noted is chronic partially occlusive DVT. Partial compressibility. Saphenofemoral Junction: Partially occlusive chronic thrombus noted. Profunda Femoral Vein: No evidence of thrombus. Normal compressibility and flow on color Doppler imaging. Femoral Vein: Partially occlusive chronic thrombus noted in the proximal femoral vein. No flow seen within the mid and distal femoral vein compatible with occlusive thrombosis. Popliteal Vein: No evidence of thrombus. Normal compressibility, respiratory phasicity and response to augmentation. Calf Veins: Not visualized Superficial Great Saphenous Vein: Not visualized Venous Reflux:  Not assessed Other Findings:  None. LEFT LOWER EXTREMITY Common Femoral Vein: Again noted is chronic partially occlusive thrombus. Partial compressibility. Saphenofemoral Junction: Partially occlusive chronic thrombus noted. Profunda Femoral Vein: No evidence of thrombus. Normal compressibility and flow on color Doppler imaging. Femoral Vein: Partially occlusive chronic thrombus again noted. Popliteal Vein: Partially occlusive chronic thrombus again noted. Calf Veins: Not visualized Superficial Great Saphenous Vein: Not visualized Venous Reflux:  Not assessed Other Findings:  None IMPRESSION: Chronic partially occlusive thrombus noted within the right common femoral vein and proximal superficial femoral vein. Occlusive thrombus  noted in the mid to distal right femoral vein. This was partially occluded previously suggesting an acute component. Chronic partially occlusive thrombus in the left common femoral vein, superficial femoral vein and popliteal vein. Electronically Signed   By: Rolm Baptise M.D.   On: 02/24/2022 00:43   DG Chest Port 1 View  Result Date: 02/23/2022 CLINICAL DATA:  Dyspnea EXAM: PORTABLE CHEST 1 VIEW COMPARISON:  None Available. FINDINGS: The heart size and mediastinal contours are within normal limits. Both lungs are clear. The visualized skeletal structures are unremarkable. IMPRESSION:  No active disease. Electronically Signed   By: Fidela Salisbury M.D.   On: 02/23/2022 23:52     Data Reviewed: Relevant notes from primary care and specialist visits, past discharge summaries as available in EHR, including Care Everywhere. 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 Consider GI consult.  Consider urology consult  Hypotension Secondary to hypovolemia from ongoing blood loss, diuretic treatment IV hydration to keep MAP over 65 Hold antihypertensives  Diabetes mellitus without complication (HCC) Sliding scale  insulin coverage  Chronic diastolic CHF (congestive heart failure) (HCC) Compensated Hold Lasix due to hypotension  Endometrial cancer [facility]) Oncology follow up  Lymphedema Holding Lasix due to soft blood pressure  Bilateral hydronephrosis s/p left nephrostomy tube June 2023 Chronic radiation cystitis with intermittent hematuria s/p fulguration June 2023 Possible UTI UA with moderate leukocyte esterases but normal WBC and lactic acid and procalcitonin Patient received cefepime and Flagyl in the ED Holding off on further antibiotic pending urine culture  Acute on chronic right femoral DVT (deep venous thrombosis) (HCC) Chronic PE History of IVC filter Holding Xarelto due to blood loss anemia        DVT prophylaxis: Lovenox EPC  Consults: none  Advance Care Planning:   Code Status: Prior   Family Communication: none  Disposition Plan: Back to previous home environment  Severity of Illness: The appropriate patient status for this patient is INPATIENT. Inpatient status is judged to be reasonable and necessary in order to provide the required intensity of service to ensure the patient's safety. The patient's presenting symptoms, physical exam findings, and initial radiographic and laboratory data in the context of their chronic comorbidities is felt to place them at high risk for further clinical deterioration. Furthermore, it is not anticipated that the patient will be medically stable for discharge from the hospital within 2 midnights of admission.   * I certify that at the point of admission it is my clinical judgment that the patient will require inpatient hospital care spanning beyond 2 midnights from the point of admission due to high intensity of service, high risk for further deterioration and high frequency of surveillance required.*  Author: Athena Masse, MD 02/24/2022 3:31 AM  For on call review [email or web address].

## 2022-02-24 NOTE — Assessment & Plan Note (Signed)
Oncology follow up 

## 2022-02-24 NOTE — Assessment & Plan Note (Addendum)
Chronic PE History of IVC filter Holding Xarelto due to blood loss anemia

## 2022-02-24 NOTE — ED Notes (Signed)
Advised nurse that patient has ready bed 

## 2022-02-24 NOTE — Assessment & Plan Note (Signed)
Sliding scale insulin coverage 

## 2022-02-25 ENCOUNTER — Inpatient Hospital Stay
Admit: 2022-02-25 | Discharge: 2022-02-25 | Disposition: A | Discharge status: Home / Self Care | Payer: Medicare HMO | Attending: Internal Medicine | Admitting: Internal Medicine

## 2022-02-25 ENCOUNTER — Ambulatory Visit: Admission: RE | Admit: 2022-02-25 | Payer: Medicaid Other | Source: Ambulatory Visit | Admitting: Radiology

## 2022-02-25 ENCOUNTER — Inpatient Hospital Stay: Payer: Medicare HMO | Admitting: Radiology

## 2022-02-25 DIAGNOSIS — K31819 Angiodysplasia of stomach and duodenum without bleeding: Secondary | ICD-10-CM | POA: Diagnosis not present

## 2022-02-25 DIAGNOSIS — J209 Acute bronchitis, unspecified: Secondary | ICD-10-CM | POA: Diagnosis present

## 2022-02-25 DIAGNOSIS — I890 Lymphedema, not elsewhere classified: Secondary | ICD-10-CM

## 2022-02-25 DIAGNOSIS — Z1629 Resistance to other single specified antibiotic: Secondary | ICD-10-CM

## 2022-02-25 DIAGNOSIS — B957 Other staphylococcus as the cause of diseases classified elsewhere: Secondary | ICD-10-CM

## 2022-02-25 DIAGNOSIS — D649 Anemia, unspecified: Secondary | ICD-10-CM | POA: Diagnosis not present

## 2022-02-25 DIAGNOSIS — Z86711 Personal history of pulmonary embolism: Secondary | ICD-10-CM | POA: Diagnosis not present

## 2022-02-25 DIAGNOSIS — C541 Malignant neoplasm of endometrium: Secondary | ICD-10-CM

## 2022-02-25 DIAGNOSIS — I82412 Acute embolism and thrombosis of left femoral vein: Secondary | ICD-10-CM

## 2022-02-25 DIAGNOSIS — N1330 Unspecified hydronephrosis: Secondary | ICD-10-CM

## 2022-02-25 DIAGNOSIS — R7881 Bacteremia: Secondary | ICD-10-CM | POA: Diagnosis not present

## 2022-02-25 HISTORY — PX: IR NEPHROSTOMY EXCHANGE LEFT: IMG6069

## 2022-02-25 LAB — RESPIRATORY PANEL BY PCR

## 2022-02-25 LAB — CBC WITH DIFFERENTIAL/PLATELET
Abs Immature Granulocytes: 0.02 10*3/uL (ref 0.00–0.07)
Basophils Absolute: 0 10*3/uL (ref 0.0–0.1)
Basophils Relative: 0 %
Eosinophils Absolute: 0.4 10*3/uL (ref 0.0–0.5)
Eosinophils Relative: 7 %
HCT: 26.7 % — ABNORMAL LOW (ref 36.0–46.0)
Hemoglobin: 8.3 g/dL — ABNORMAL LOW (ref 12.0–15.0)
Immature Granulocytes: 0 %
Lymphocytes Relative: 17 %
Lymphs Abs: 0.9 10*3/uL (ref 0.7–4.0)
MCH: 26.2 pg (ref 26.0–34.0)
MCHC: 31.1 g/dL (ref 30.0–36.0)
MCV: 84.2 fL (ref 80.0–100.0)
Monocytes Absolute: 0.3 10*3/uL (ref 0.1–1.0)
Monocytes Relative: 6 %
Neutro Abs: 3.5 10*3/uL (ref 1.7–7.7)
Neutrophils Relative %: 70 %
Platelets: 225 10*3/uL (ref 150–400)
RBC: 3.17 MIL/uL — ABNORMAL LOW (ref 3.87–5.11)
RDW: 18 % — ABNORMAL HIGH (ref 11.5–15.5)
WBC: 5.1 10*3/uL (ref 4.0–10.5)
nRBC: 0 % (ref 0.0–0.2)

## 2022-02-25 LAB — BASIC METABOLIC PANEL
Anion gap: 5 (ref 5–15)
BUN: 18 mg/dL (ref 8–23)
CO2: 24 mmol/L (ref 22–32)
Calcium: 8.2 mg/dL — ABNORMAL LOW (ref 8.9–10.3)
Chloride: 110 mmol/L (ref 98–111)
Creatinine, Ser: 0.84 mg/dL (ref 0.44–1.00)
GFR, Estimated: >60 mL/min (ref >60)
Glucose, Bld: 95 mg/dL (ref 70–99)
Potassium: 4.1 mmol/L (ref 3.5–5.1)
Sodium: 139 mmol/L (ref 135–145)

## 2022-02-25 LAB — ECHOCARDIOGRAM COMPLETE
AR max vel: 2 cm2
AV Area VTI: 1.99 cm2
AV Area mean vel: 1.78 cm2
AV Mean grad: 7 mmHg
AV Peak grad: 11.8 mmHg
Ao pk vel: 1.72 m/s
Area-P 1/2: 3.58 cm2
Height: 59 in
S' Lateral: 2.01 cm
Weight: 2800 oz

## 2022-02-25 LAB — PROTIME-INR
INR: 1.1 (ref 0.8–1.2)
Prothrombin Time: 14.5 seconds (ref 11.4–15.2)

## 2022-02-25 LAB — URINE CULTURE

## 2022-02-25 LAB — APTT: aPTT: 33 seconds (ref 24–36)

## 2022-02-25 LAB — HEPARIN LEVEL (UNFRACTIONATED): Heparin Unfractionated: <0.1 IU/mL — ABNORMAL LOW (ref 0.30–0.70)

## 2022-02-25 MED ORDER — VANCOMYCIN HCL 750 MG/150ML IV SOLN
750.0000 mg | INTRAVENOUS | Status: DC
  Administered 2022-02-26 – 2022-02-27 (×2): 750 mg via INTRAVENOUS
  Filled 2022-02-25 (×2): qty 150

## 2022-02-25 MED ORDER — SODIUM CHLORIDE 0.9 % IV SOLN: INTRAVENOUS | Status: DC | PRN

## 2022-02-25 MED ORDER — HEPARIN (PORCINE) 25000 UT/250ML-% IV SOLN: 16.0000 [IU]/kg/h | INTRAVENOUS | Status: DC

## 2022-02-25 MED ORDER — VANCOMYCIN HCL 1750 MG/350ML IV SOLN
1750.0000 mg | Freq: Once | INTRAVENOUS | Status: AC
  Administered 2022-02-25: 1750 mg via INTRAVENOUS
  Filled 2022-02-25: qty 350

## 2022-02-25 MED ORDER — HEPARIN BOLUS VIA INFUSION
4500.0000 [IU] | Freq: Once | INTRAVENOUS | Status: AC
  Administered 2022-02-25: 4500 [IU] via INTRAVENOUS
  Filled 2022-02-25: qty 4500

## 2022-02-25 MED ORDER — HEPARIN (PORCINE) 25000 UT/250ML-% IV SOLN
1150.0000 [IU]/h | INTRAVENOUS | Status: DC
  Administered 2022-02-25: 1000 [IU]/h via INTRAVENOUS
  Administered 2022-02-26: 1150 [IU]/h via INTRAVENOUS
  Filled 2022-02-25 (×2): qty 250

## 2022-02-25 MED ORDER — IOHEXOL 350 MG/ML SOLN
4.0000 mL | Freq: Once | INTRAVENOUS | Status: AC | PRN
  Administered 2022-02-25: 4 mL

## 2022-02-25 MED ORDER — LIDOCAINE HCL 1 % IJ SOLN
INTRAMUSCULAR | Status: AC
  Administered 2022-02-25: 2 mL
  Filled 2022-02-25: qty 20

## 2022-02-25 NOTE — Progress Notes (Signed)
PHARMACY - PHYSICIAN COMMUNICATION CRITICAL VALUE ALERT - BLOOD CULTURE IDENTIFICATION (BCID)  Courtney Mack is an 66 y.o. female who presented to [facility] on 02/23/2022 with a chief complaint of GI bleed, blood loss.   Assessment:  Staph epi growing in 3 of 4 bottles,  Mec A detected.  (include suspected source if known)  Name of physician (or Provider) Contacted: Neomia Glass, NP   Current antibiotics: none   Changes to prescribed antibiotics recommended:  NP agreed to start Vancomycin.   Results for orders placed or performed during the hospital encounter of 02/23/22  Blood Culture ID Panel (Reflexed) (Collected: 02/24/2022  2:35 AM)  Result Value Ref Range   Enterococcus faecalis NOT DETECTED NOT DETECTED   Enterococcus Faecium NOT DETECTED NOT DETECTED   Listeria monocytogenes NOT DETECTED NOT DETECTED   Staphylococcus species DETECTED (A) NOT DETECTED   Staphylococcus aureus (BCID) NOT DETECTED NOT DETECTED   Staphylococcus epidermidis DETECTED (A) NOT DETECTED   Staphylococcus lugdunensis NOT DETECTED NOT DETECTED   Streptococcus species NOT DETECTED NOT DETECTED   Streptococcus agalactiae NOT DETECTED NOT DETECTED   Streptococcus pneumoniae NOT DETECTED NOT DETECTED   Streptococcus pyogenes NOT DETECTED NOT DETECTED   A.calcoaceticus-baumannii NOT DETECTED NOT DETECTED   Bacteroides fragilis NOT DETECTED NOT DETECTED   Enterobacterales NOT DETECTED NOT DETECTED   Enterobacter cloacae complex NOT DETECTED NOT DETECTED   Escherichia coli NOT DETECTED NOT DETECTED   Klebsiella aerogenes NOT DETECTED NOT DETECTED   Klebsiella oxytoca NOT DETECTED NOT DETECTED   Klebsiella pneumoniae NOT DETECTED NOT DETECTED   Proteus species NOT DETECTED NOT DETECTED   Salmonella species NOT DETECTED NOT DETECTED   Serratia marcescens NOT DETECTED NOT DETECTED   Haemophilus influenzae NOT DETECTED NOT DETECTED   Neisseria meningitidis NOT DETECTED NOT DETECTED   Pseudomonas  aeruginosa NOT DETECTED NOT DETECTED   Stenotrophomonas maltophilia NOT DETECTED NOT DETECTED   Candida albicans NOT DETECTED NOT DETECTED   Candida auris NOT DETECTED NOT DETECTED   Candida glabrata NOT DETECTED NOT DETECTED   Candida krusei NOT DETECTED NOT DETECTED   Candida parapsilosis NOT DETECTED NOT DETECTED   Candida tropicalis NOT DETECTED NOT DETECTED   Cryptococcus neoformans/gattii NOT DETECTED NOT DETECTED   Methicillin resistance mecA/C DETECTED (A) NOT DETECTED    Tavonte Seybold D 02/25/2022  2:12 AM

## 2022-02-25 NOTE — Progress Notes (Signed)
Pharmacy Antibiotic Note  Courtney Mack is a 66 y.o. female admitted on 02/23/2022 with bacteremia.  Pharmacy has been consulted for Vancomycin dosing. Staph epi in 3 of 4 bottles , Mec A +.   Plan: Vancomycin 1750 gm IV X 1 given in on 8/21 @ 0141.  Vancomycin 750 mg IV Q24H ordered to start on 8/22 @ 0100.   AUC = 448.9  Vanc trough = 11.3   Height: '4\' 11"'$  (149.9 cm) Weight: 79.4 kg (175 lb) IBW/kg (Calculated) : 43.2  Temp (24hrs), Avg:98.1 F (36.7 C), Min:97.7 F (36.5 C), Max:98.8 F (37.1 C)  Recent Labs  Lab 02/23/22 2327 02/24/22 0201 02/24/22 0234 02/24/22 0907  WBC 5.3  --   --  4.6  CREATININE 0.89 0.82  --   --   LATICACIDVEN  --   --  0.7  --     Estimated Creatinine Clearance: 60.6 mL/min (by C-G formula based on SCr of 0.82 mg/dL).    No Known Allergies  Antimicrobials this admission:   >>    >>   Dose adjustments this admission:   Microbiology results:  BCx:   UCx:    Sputum:    MRSA PCR:   Thank you for allowing pharmacy to be a part of this patient's care.  Courtney Mack D 02/25/2022 2:10 AM

## 2022-02-25 NOTE — Consult Note (Signed)
Consultation  Referring Provider:     Dr Damita Dunnings Admit date 02/24/22 Consult date        02/25/22 Reason for Consultation:              HPI:   Courtney Mack is a 66 y.o. female  with medical history significant for Recurrent DVT and PE on Xarelto s/p IVC filter, HTN, endometrial cancer, radiation cystitis s/p fulguration with intermittent hematuria requiring CBI, lymphedema, left nephrostomy tube since 12/27/2021 secondary to left hydronephrosis with ureteral stricture recently hospitalized from 7/25 to 7/29 with acute on chronic blood loss anemia requiring 3 units for hemoglobin of 4.7, with GAVE seen on EGD treated with argon plasma coagulation and resumed on Xarelto. She presented to ED yesterday with concerns of lightheadedness/sob/doe - was found to have hemoglobin of 6.8, last was 7.8 from 3w ago. Note her BUN was normal at 17. Was noted to be heme + in ED. Was transfused a unit of prbcs with improvement to her hemoglobin- was 8.3 today She had her nephrostomy tube exchanged today and ID has been consulted for staphylococcus in blood cultures  Had CT angiography of chest/and CT scan of a/p yesterday- IMPRESSION: 1. No acute pulmonary embolus. 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 2. Progressive bronchial wall thickening and extensive airway impaction within the lower lobes bilaterally in keeping with airway inflammation. Extensive endobronchial debris can be seen in the setting of superimposed acute infection or aspiration. 3. Mosaic attenuation of the pulmonary parenchyma in keeping with multifocal air trapping likely related to small airways disease. 4. Minimal coronary artery calcification. 5. Interval removal of left double-J ureteral stent and placement of a left percutaneous nephrostomy catheter in expected position. No hydronephrosis. 6. Minimal nonobstructing right nephrolithiasis. 7. Persistent marked circumferential  perivesicular inflammatory stranding suggesting an underlying infectious or inflammatory cystitis. Correlation with urinalysis and urine culture may be helpful. 8. 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 9. Peripheral vascular disease with suspected hemodynamically significant stenoses of the celiac axis and superior mesenteric artery proximally. Clinical correlation for signs and symptoms of chronic mesenteric ischemia recommended. If indicated, this would be better assessed with CT arteriography. 10.  Aortic Atherosclerosis (ICD10-I70.0).  Patient reports she started feeling winded and weak at home which is why she came back to the hospital. She states has some intermittent nonsevere variable abdominal pain in the llq, sometimes triggered by eating, sometimes not- she is unsure of the frequency but it is not new or different from other occurrences. States she had 3 loose/watery stools last Thursday, otherwise moving her bowels normally for her, about once qd. She states she is unsure if she is having and black stools or bloody stools due to her cataracts. States her gerd is well controlled with daily pantoprazole. Denies dysphagia. Denies nsaids. Denies further GI concerns- states her appetite is good and she is able to eat full meals without difficulty.  PREVIOUS ENDOSCOPIES:         EGD 02/01/22- Dr Marius Ditch-     normal GEJ and duodenum. Severe GAVE without bleeding treated with APC  CT A/P w/wo bleeding scan 01/30/22- no active bleeding, concern for gastric ulcer v mass  EGD 4/23- Dr Allen Norris- patchy white plaques in esophagus, GAVE without bleeding treated with APC, normal duodenum  Small bowel enteroscopy 2/23- Dr Marius Ditch- nornal esophagus, normal duodenum, moderat diffiuse GAVE without bleeding  in prepyloric region of stomach treated with APC. Diffiuse atrophin mucosa throughout stomach.no evidence of significant pathology in examined jejunum. Path showed chronic active gastritis negative for h pylori, intestinal metaplasia, dysplasia, malignancy  Gi history also notable for IR embolization 1/23 after + GI bleeding scan demonstrating active bleeding at the splenic flexure  VCE 06/22/21- small bowel ulceration at 1256, inflammation noted at 1541 EGD 1216/22- Dr Alice Reichert- esophageal candidiasis treated with fluconazole, gastritis with intestinal metaplasia present in on focus. Negative for h pylori/malignancy. Colonsocopy 06/21/21- Dr Alice Reichert- small cecal adenoma, hemorrhoids   Past Medical History:  Diagnosis Date   Acute bilateral deep vein thrombosis (DVT) of femoral veins ([place]) 01/02/2021   Acute massive pulmonary embolism ([place]) 08/25/2020   Last Assessment & Plan:  Formatting of this note might be different from the original. Post vena caval filter and on xarelto and O2   Anemia    ARF (acute respiratory failure) ([place])    Cellulitis of left lower leg 06/20/2021   CHF (congestive heart failure) ([place])    COVID-19    Diabetes mellitus without complication ([place])    DVT (deep venous thrombosis) ([place]) 09/06/2020   Endometrial cancer ([facility])    GAVE (gastric antral vascular ectasia)    GERD (gastroesophageal reflux disease)    High cholesterol    Hx of blood clots    Hyperlipidemia    Hypertension    IDA (iron deficiency anemia) 09/21/2020   Obesity    Pressure injury of skin 06/21/2021   Pulmonary embolism ([place])    Sepsis ([place])    Thrombocytopenia ([place])    Urinary tract infection 09/13/2021    Past Surgical History:  Procedure Laterality Date   COLONOSCOPY N/A 06/21/2021   Procedure: COLONOSCOPY;  Surgeon: Toledo, Benay Pike, MD;  Location: [facility] ENDOSCOPY;  Service: Gastroenterology;  Laterality: N/A;   CYSTOSCOPY W/ URETERAL STENT PLACEMENT Left 09/11/2021   Procedure:  CYSTOSCOPY WITH RETROGRADE PYELOGRAM/URETERAL STENT PLACEMENT;  Surgeon: Abbie Sons, MD;  Location: [facility] ORS;  Service: Urology;  Laterality: Left;   CYSTOSCOPY W/ URETERAL STENT PLACEMENT Left 11/17/2021   Procedure: CYSTOSCOPY WITH STENT REPLACEMENT;  Surgeon: Irine Seal, MD;  Location: [facility] ORS;  Service: Urology;  Laterality: Left;   CYSTOSCOPY WITH FULGERATION  11/17/2021   Procedure: CYSTOSCOPY WITH FULGERATION;  Surgeon: Irine Seal, MD;  Location: [facility] ORS;  Service: Urology;;   CYSTOSCOPY WITH STENT PLACEMENT Left 10/17/2021   Procedure: CYSTOSCOPY WITH STENT PLACEMENT;  Surgeon: Abbie Sons, MD;  Location: [facility] ORS;  Service: Urology;  Laterality: Left;   CYSTOSCOPY WITH URETEROSCOPY, STONE BASKETRY AND STENT PLACEMENT Left 10/16/2021   Procedure: CYSTOSCOPY WITH URETEROSCOPY  AND STENT REMOVAL;  Surgeon: Abbie Sons, MD;  Location: [facility] ORS;  Service: Urology;  Laterality: Left;   ENTEROSCOPY N/A 08/17/2021   Procedure: ENTEROSCOPY;  Surgeon: Lin Landsman, MD;  Location: [facility] ENDOSCOPY;  Service: Gastroenterology;  Laterality: N/A;   ESOPHAGOGASTRODUODENOSCOPY N/A 06/21/2021   Procedure: ESOPHAGOGASTRODUODENOSCOPY (EGD);  Surgeon: Toledo, Benay Pike, MD;  Location: [facility] ENDOSCOPY;  Service: Gastroenterology;  Laterality: N/A;   ESOPHAGOGASTRODUODENOSCOPY (EGD) WITH PROPOFOL N/A 08/17/2021   Procedure: ESOPHAGOGASTRODUODENOSCOPY (EGD) WITH PROPOFOL;  Surgeon: Lin Landsman, MD;  Location: [facility] ENDOSCOPY;  Service: Gastroenterology;  Laterality: N/A;   ESOPHAGOGASTRODUODENOSCOPY (EGD) WITH PROPOFOL N/A 10/14/2021   Procedure: ESOPHAGOGASTRODUODENOSCOPY (EGD) WITH PROPOFOL;  Surgeon: [name], MD;  Location: [facility] ENDOSCOPY;  Service: Endoscopy;  Laterality: N/A;   ESOPHAGOGASTRODUODENOSCOPY (EGD) WITH PROPOFOL N/A 02/01/2022   Procedure: ESOPHAGOGASTRODUODENOSCOPY (EGD) WITH PROPOFOL;  Surgeon: Lin Landsman, MD;  Location: [facility] ENDOSCOPY;  Service:  Gastroenterology;  Laterality: N/A;   GIVENS CAPSULE STUDY N/A 06/22/2021   Procedure: GIVENS CAPSULE STUDY;  Surgeon: Toledo, Benay Pike, MD;  Location: [facility] ENDOSCOPY;  Service: Gastroenterology;  Laterality: N/A;   IR NEPHROSTOMY PLACEMENT LEFT  12/27/2021   IVC FILTER INSERTION N/A 08/18/2020   Procedure: IVC FILTER INSERTION;  Surgeon: Katha Cabal, MD;  Location: [place] CV LAB;  Service: Cardiovascular;  Laterality: N/A;   PERIPHERAL VASCULAR THROMBECTOMY Bilateral 01/03/2021   Procedure: PERIPHERAL VASCULAR THROMBECTOMY;  Surgeon: Algernon Huxley, MD;  Location: [place] CV LAB;  Service: Cardiovascular;  Laterality: Bilateral;   PULMONARY THROMBECTOMY N/A 08/18/2020   Procedure: PULMONARY THROMBECTOMY;  Surgeon: Katha Cabal, MD;  Location: [place] [facility] LAB;  Service: Cardiovascular;  Laterality: N/A;   TUBAL LIGATION     VISCERAL ANGIOGRAPHY N/A 07/18/2021   Procedure: VISCERAL ANGIOGRAPHY;  Surgeon: Algernon Huxley, MD;  Location: [place] CV LAB;  Service: Cardiovascular;  Laterality: N/A;    Family History  Problem Relation Age of Onset   Hypertension Mother    Diabetes Mother    Diabetes Father    Hypertension Father    High Cholesterol Father    Congestive Heart Failure Father    Breast cancer Cousin      Social History   Tobacco Use   Smoking status: Never   Smokeless tobacco: Never   Tobacco comments:    smoked 2-4 cigarettes for about 2-3 weeks   Vaping Use   Vaping Use: Never used  Substance Use Topics   Alcohol use: Not Currently   Drug use: Not Currently    Prior to Admission medications   Medication Sig Start Date End Date Taking? Authorizing Provider  acetaminophen (TYLENOL) 500 MG tablet Take 500 mg by mouth every 6 (six) hours as needed for mild pain or fever.   Yes [provider]  Ascorbic Acid (VITAMIN C) 1000 MG tablet Take 1,000 mg by mouth daily.   Yes [provider]  BREO ELLIPTA 100-25 MCG/ACT AEPB  Inhale 1 puff into the lungs daily. 11/15/21  Yes [provider]  cyanocobalamin 1000 MCG tablet Take 1 tablet (1,000 mcg total) by mouth daily. 02/03/22  Yes Fritzi Mandes, MD  DULERA 100-5 MCG/ACT AERO Inhale 2 puffs into the lungs 2 (two) times daily. 02/21/22  Yes [provider]  furosemide (LASIX) 20 MG tablet Hold due to low blood pressure. Patient taking differently: Take 20 mg by mouth daily. 12/31/21  Yes Enzo Bi, MD  loperamide (IMODIUM) 2 MG capsule Take 1 capsule (2 mg total) by mouth as needed for diarrhea or loose stools. 06/26/21  Yes Sidney Ace, MD  Multiple Vitamin (MULTIVITAMIN WITH MINERALS) TABS tablet Take 1 tablet by mouth daily. 09/12/20  Yes [name], MD  pantoprazole (PROTONIX) 40 MG tablet Take 40 mg by mouth every morning.   Yes [provider]  rivaroxaban (XARELTO) 10 MG TABS tablet Hold until followup with urology. 02/02/22  Yes Fritzi Mandes, MD  rosuvastatin (CRESTOR) 10 MG tablet Take 1 tablet (10 mg total) by mouth daily. Patient taking differently: Take 10 mg by mouth every morning. 10/03/20  Yes Earlie Server, MD  feeding supplement (ENSURE ENLIVE / ENSURE PLUS) LIQD Take 237 mLs by mouth 3 (three) times daily between meals. 02/02/22  Fritzi Mandes, MD    Current Facility-Administered Medications  Medication Dose Route Frequency Provider Last Rate Last Admin   0.9 %  sodium chloride infusion (Manually program via Guardrails IV Fluids)   Intravenous Once Swayze, Ava, DO   Held at 02/24/22 1156   0.9 %  sodium chloride infusion   Intravenous PRN Swayze, Ava, DO 10 mL/hr at 02/25/22 0349 Infusion Verify at 02/25/22 0349   acetaminophen (TYLENOL) tablet 650 mg  650 mg Oral Q6H PRN Athena Masse, MD       Or   acetaminophen (TYLENOL) suppository 650 mg  650 mg Rectal Q6H PRN Athena Masse, MD       ondansetron [facility]) tablet 4 mg  4 mg Oral Q6H PRN Athena Masse, MD       Or   ondansetron [facility]) injection 4 mg  4 mg  Intravenous Q6H PRN Athena Masse, MD       pantoprazole (PROTONIX) injection 40 mg  40 mg Intravenous Q24H Judd Gaudier V, MD   40 mg at 02/25/22 0345   [START ON 02/26/2022] vancomycin (VANCOREADY) IVPB 750 mg/150 mL  750 mg Intravenous Q24H Swayze, Ava, DO        Allergies as of 02/23/2022   (No Known Allergies)     Review of Systems:    All systems reviewed and negative except where noted in HPI.     Physical Exam:  Vital signs in last 24 hours: Temp:  [98.1 F (36.7 C)-98.8 F (37.1 C)] 98.1 F (36.7 C) (08/21 0851) Pulse Rate:  [71-85] 71 (08/21 0851) Resp:  [16-20] 16 (08/21 0851) BP: (88-113)/(37-61) 113/61 (08/21 0851) SpO2:  [92 %-98 %] 95 % (08/21 0851) Last BM Date : 02/22/22 General:   Pleasant elderly woman in NAD Head:  Normocephalic and atraumatic. Eyes:   No icterus.   Conjunctiva pink. Ears:  Normal auditory acuity. Mouth: Mucosa pink moist, no lesions. Neck:  Supple; no masses felt Lungs:  Respirations even and unlabored. Lungs clear to auscultation bilaterally.   No wheezes, crackles, or rhonchi.  Heart:  S1S2, RRR, no MRG. Trace generalized edema. Abdomen:   Flat, soft, nondistended, nontender. Normal bowel sounds. No appreciable masses or hepatomegaly. No rebound signs or other peritoneal signs. Rectal:  Not performed.  Msk:  MAEW x4, No clubbing or cyanosis. Strength 4/5. Symmetrical without gross deformities. Neurologic:  Alert and  oriented x4;  Cranial nerves II-XII intact.  Skin:  Warm, dry, pale pink without significant lesions or rashes. Psych:  Alert and cooperative. Normal affect.  LAB RESULTS: Recent Labs    02/23/22 2327 02/24/22 0907 02/25/22 0414  WBC 5.3 4.6 5.1  HGB 6.8* 7.2* 8.3*  HCT 23.1* 23.6* 26.7*  PLT 288 240 225   BMET Recent Labs    02/23/22 2327 02/24/22 0201 02/25/22 0414  NA 139 140 139  K 3.3* 3.2* 4.1  CL 105 109 110  CO2 '27 23 24  '$ GLUCOSE 152* 103* 95  BUN '18 17 18  '$ CREATININE 0.89 0.82 0.84   CALCIUM 8.6* 7.9* 8.2*   LFT Recent Labs    02/23/22 2327  PROT 6.7  ALBUMIN 3.2*  AST 22  ALT 11  ALKPHOS 41  BILITOT 0.7   PT/INR No results for input(s): "LABPROT", "INR" in the last 72 hours.  STUDIES: CT Angio Chest PE W and/or Wo Contrast  Result Date: 02/24/2022 CLINICAL DATA:  Pulmonary embolism (PE) suspected, positive D-dimer; Abdominal pain, acute, nonlocalized back pain, nephromstomy  tube on Left EXAM: CT ANGIOGRAPHY CHEST CT ABDOMEN AND PELVIS WITH CONTRAST TECHNIQUE: Multidetector CT imaging of the chest was performed using the standard protocol during bolus administration of intravenous contrast. Multiplanar CT image reconstructions and MIPs were obtained to evaluate the vascular anatomy. Multidetector CT imaging of the abdomen and pelvis was performed using the standard protocol during bolus administration of intravenous contrast. RADIATION DOSE REDUCTION: This exam was performed according to the departmental dose-optimization program which includes automated exposure control, adjustment of the mA and/or kV according to patient size and/or use of iterative reconstruction technique. CONTRAST:  54m OMNIPAQUE IOHEXOL 350 MG/ML SOLN COMPARISON:  11/17/2021 FINDINGS: CTA CHEST FINDINGS Cardiovascular: There is adequate opacification of the pulmonary arterial tree. Tiny web identified within the a right lower lobar pulmonary artery in keeping with chronic pulmonary embolus, stable since prior examination. No acute pulmonary embolus identified. Central pulmonary arteries are of normal caliber. Minimal coronary artery calcification. Global cardiac size within normal limits. No pericardial effusion. Mild atherosclerotic calcification within the thoracic aorta. No aortic aneurysm. Mediastinum/Nodes: No enlarged mediastinal, hilar, or axillary lymph nodes. Thyroid gland, trachea, and esophagus demonstrate no significant findings. Lungs/Pleura: Subpleural architectural distortion and  interstitial thickening within the a anterior left upper lobe is stable since prior examination in keeping with focal fibrosis. There is diffuse progressive bronchial wall thickening and new extensive airway impaction within the lower lobes bilaterally in keeping with airway inflammation. Extensive endobronchial debris can be seen in the setting of superimposed acute infection or aspiration. Mosaic attenuation of the pulmonary parenchyma is in keeping with multifocal air trapping likely related to small airways disease. No pneumothorax or pleural effusion. Musculoskeletal: No acute bone abnormality. Osseous structures are age-appropriate. Review of the MIP images confirms the above findings. CT ABDOMEN and PELVIS FINDINGS Hepatobiliary: No focal liver abnormality is seen. No gallstones, gallbladder wall thickening, or biliary dilatation. Pancreas: Unremarkable Spleen: Unremarkable Adrenals/Urinary Tract: The adrenal glands are unremarkable. The kidneys are normal in size and position. Left percutaneous nephrostomy catheter has been placed and is in expected position with its retaining loop formed within the left renal pelvis. Interval removal of left double-J ureteral stent. No hydronephrosis. 3 mm nonobstructing calculus is noted within the lower pole of the right kidney. There is persistent marked circumferential perivesicular inflammatory stranding suggesting an underlying infectious or inflammatory cystitis. The bladder is decompressed. Stomach/Bowel: Stomach is within normal limits. Appendix appears normal. No evidence of bowel wall thickening, distention, or inflammatory changes. Vascular/Lymphatic: Mild aortoiliac atherosclerotic calcification. Prominent atherosclerotic calcification noted at the origin of the mesenteric and renal vasculature suspected hemodynamically significant stenoses of the celiac axis and superior mesenteric artery proximally. This is not well assessed, however, on this non  arteriographic examination. Infrarenal inferior vena cava filter is unchanged in position, fixed just below the confluence of the retroaortic left renal vein. There is marked narrowing of the common iliac and proximal inferior vena cava suggesting chronic thrombosis, unchanged from prior examination. No pathologic adenopathy within the abdomen and pelvis. Reproductive: Uterus and bilateral adnexa are unremarkable. 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 This does appear slightly progressive since prior examination, however. No abdominal wall hernia Musculoskeletal: Advanced degenerative changes are noted within the hips bilaterally. No acute bone abnormality. No lytic or blastic bone lesion. Review of the MIP images confirms the above findings. IMPRESSION: 1. No acute pulmonary embolus. Stable  minimal chronic pulmonary embolus within the right lower lobar pulmonary artery. 2. Progressive bronchial wall thickening and extensive airway impaction within the lower lobes bilaterally in keeping with airway inflammation. Extensive endobronchial debris can be seen in the setting of superimposed acute infection or aspiration. 3. Mosaic attenuation of the pulmonary parenchyma in keeping with multifocal air trapping likely related to small airways disease. 4. Minimal coronary artery calcification. 5. Interval removal of left double-J ureteral stent and placement of a left percutaneous nephrostomy catheter in expected position. No hydronephrosis. 6. Minimal nonobstructing right nephrolithiasis. 7. Persistent marked circumferential perivesicular inflammatory stranding suggesting an underlying infectious or inflammatory cystitis. Correlation with urinalysis and urine culture may be helpful. 8. Stable infrarenal inferior vena cava filter with evidence of chronic thrombosis  of the common iliac and proximal inferior vena cava. 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 9. Peripheral vascular disease with suspected hemodynamically significant stenoses of the celiac axis and superior mesenteric artery proximally. Clinical correlation for signs and symptoms of chronic mesenteric ischemia recommended. If indicated, this would be better assessed with CT arteriography. 10.  Aortic Atherosclerosis (ICD10-I70.0). Electronically Signed   By: Fidela Salisbury M.D.   On: 02/24/2022 01:21   CT ABDOMEN PELVIS W CONTRAST  Result Date: 02/24/2022 CLINICAL DATA:  Pulmonary embolism (PE) suspected, positive D-dimer; Abdominal pain, acute, nonlocalized back pain, nephromstomy tube on Left EXAM: CT ANGIOGRAPHY CHEST CT ABDOMEN AND PELVIS WITH CONTRAST TECHNIQUE: Multidetector CT imaging of the chest was performed using the standard protocol during bolus administration of intravenous contrast. Multiplanar CT image reconstructions and MIPs were obtained to evaluate the vascular anatomy. Multidetector CT imaging of the abdomen and pelvis was performed using the standard protocol during bolus administration of intravenous contrast. RADIATION DOSE REDUCTION: This exam was performed according to the departmental dose-optimization program which includes automated exposure control, adjustment of the mA and/or kV according to patient size and/or use of iterative reconstruction technique. CONTRAST:  60m OMNIPAQUE IOHEXOL 350 MG/ML SOLN COMPARISON:  11/17/2021 FINDINGS: CTA CHEST FINDINGS Cardiovascular: There is adequate opacification of the pulmonary arterial tree. Tiny web identified within the a right lower lobar pulmonary artery in keeping with chronic pulmonary embolus, stable since prior examination. No acute pulmonary embolus identified. Central pulmonary arteries are of normal caliber. Minimal coronary artery calcification. Global  cardiac size within normal limits. No pericardial effusion. Mild atherosclerotic calcification within the thoracic aorta. No aortic aneurysm. Mediastinum/Nodes: No enlarged mediastinal, hilar, or axillary lymph nodes. Thyroid gland, trachea, and esophagus demonstrate no significant findings. Lungs/Pleura: Subpleural architectural distortion and interstitial thickening within the a anterior left upper lobe is stable since prior examination in keeping with focal fibrosis. There is diffuse progressive bronchial wall thickening and new extensive airway impaction within the lower lobes bilaterally in keeping with airway inflammation. Extensive endobronchial debris can be seen in the setting of superimposed acute infection or aspiration. Mosaic attenuation of the pulmonary parenchyma is in keeping with multifocal air trapping likely related to small airways disease. No pneumothorax or pleural effusion. Musculoskeletal: No acute bone abnormality. Osseous structures are age-appropriate. Review of the MIP images confirms the above findings. CT ABDOMEN and PELVIS FINDINGS Hepatobiliary: No focal liver abnormality is seen. No gallstones, gallbladder wall thickening, or biliary dilatation. Pancreas: Unremarkable Spleen: Unremarkable Adrenals/Urinary Tract: The adrenal glands are unremarkable. The kidneys are normal in size and position. Left percutaneous nephrostomy catheter has been placed and is in expected position with its retaining loop formed  within the left renal pelvis. Interval removal of left double-J ureteral stent. No hydronephrosis. 3 mm nonobstructing calculus is noted within the lower pole of the right kidney. There is persistent marked circumferential perivesicular inflammatory stranding suggesting an underlying infectious or inflammatory cystitis. The bladder is decompressed. Stomach/Bowel: Stomach is within normal limits. Appendix appears normal. No evidence of bowel wall thickening, distention, or  inflammatory changes. Vascular/Lymphatic: Mild aortoiliac atherosclerotic calcification. Prominent atherosclerotic calcification noted at the origin of the mesenteric and renal vasculature suspected hemodynamically significant stenoses of the celiac axis and superior mesenteric artery proximally. This is not well assessed, however, on this non arteriographic examination. Infrarenal inferior vena cava filter is unchanged in position, fixed just below the confluence of the retroaortic left renal vein. There is marked narrowing of the common iliac and proximal inferior vena cava suggesting chronic thrombosis, unchanged from prior examination. No pathologic adenopathy within the abdomen and pelvis. Reproductive: Uterus and bilateral adnexa are unremarkable. 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 This does appear slightly progressive since prior examination, however. No abdominal wall hernia Musculoskeletal: Advanced degenerative changes are noted within the hips bilaterally. No acute bone abnormality. No lytic or blastic bone lesion. Review of the MIP images confirms the above findings. IMPRESSION: 1. No acute pulmonary embolus. 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 2. Progressive bronchial wall thickening and extensive airway impaction within the lower lobes bilaterally in keeping with airway inflammation. Extensive endobronchial debris can be seen in the setting of superimposed acute infection or aspiration. 3. Mosaic attenuation of the pulmonary parenchyma in keeping with multifocal air trapping likely related to small airways disease. 4. Minimal coronary artery calcification. 5. Interval removal of left double-J ureteral stent and placement of a left percutaneous nephrostomy catheter in expected position. No  hydronephrosis. 6. Minimal nonobstructing right nephrolithiasis. 7. Persistent marked circumferential perivesicular inflammatory stranding suggesting an underlying infectious or inflammatory cystitis. Correlation with urinalysis and urine culture may be helpful. 8. 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 9. Peripheral vascular disease with suspected hemodynamically significant stenoses of the celiac axis and superior mesenteric artery proximally. Clinical correlation for signs and symptoms of chronic mesenteric ischemia recommended. If indicated, this would be better assessed with CT arteriography. 10.  Aortic Atherosclerosis (ICD10-I70.0). Electronically Signed   By: Fidela Salisbury M.D.   On: 02/24/2022 01:21   US Venous Img Lower Bilateral (DVT)  Result Date: 02/24/2022 CLINICAL DATA:  Leg swelling.  History of DVT EXAM: BILATERAL LOWER EXTREMITY VENOUS DOPPLER ULTRASOUND TECHNIQUE: Gray-scale sonography with graded compression, as well as color Doppler and duplex ultrasound were performed to evaluate the lower extremity deep venous systems from the level of the common femoral vein and including the common femoral, femoral, profunda femoral, popliteal and calf veins including the posterior tibial, peroneal and gastrocnemius veins when visible. The superficial great saphenous vein was also interrogated. Spectral Doppler was utilized to evaluate flow at rest and with distal augmentation maneuvers in the common femoral, femoral and popliteal veins. COMPARISON:  None Available. FINDINGS: RIGHT LOWER EXTREMITY Common Femoral Vein: Again noted is chronic partially occlusive DVT. Partial compressibility. Saphenofemoral Junction: Partially occlusive chronic thrombus noted. Profunda Femoral Vein: No  evidence of  thrombus. Normal compressibility and flow on color Doppler imaging. Femoral Vein: Partially occlusive chronic thrombus noted in the proximal femoral vein. No flow seen within the mid and distal femoral vein compatible with occlusive thrombosis. Popliteal Vein: No evidence of thrombus. Normal compressibility, respiratory phasicity and response to augmentation. Calf Veins: Not visualized Superficial Great Saphenous Vein: Not visualized Venous Reflux:  Not assessed Other Findings:  None. LEFT LOWER EXTREMITY Common Femoral Vein: Again noted is chronic partially occlusive thrombus. Partial compressibility. Saphenofemoral Junction: Partially occlusive chronic thrombus noted. Profunda Femoral Vein: No evidence of thrombus. Normal compressibility and flow on color Doppler imaging. Femoral Vein: Partially occlusive chronic thrombus again noted. Popliteal Vein: Partially occlusive chronic thrombus again noted. Calf Veins: Not visualized Superficial Great Saphenous Vein: Not visualized Venous Reflux:  Not assessed Other Findings:  None IMPRESSION: Chronic partially occlusive thrombus noted within the right common femoral vein and proximal superficial femoral vein. Occlusive thrombus noted in the mid to distal right femoral vein. This was partially occluded previously suggesting an acute component. Chronic partially occlusive thrombus in the left common femoral vein, superficial femoral vein and popliteal vein. Electronically Signed   By: Rolm Baptise M.D.   On: 02/24/2022 00:43   DG Chest Port 1 View  Result Date: 02/23/2022 CLINICAL DATA:  Dyspnea EXAM: PORTABLE CHEST 1 VIEW COMPARISON:  None Available. FINDINGS: The heart size and mediastinal contours are within normal limits. Both lungs are clear. The visualized skeletal structures are unremarkable. IMPRESSION: No active disease. Electronically Signed   By: Fidela Salisbury M.D.   On: 02/23/2022 23:52       Impression / Plan:   Anemia- several recent GIB issues-  and several procedures this year. Colonoscopy up to date, had VCE.  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 She repsonded to the 1u prbc and hgb up from post transfusion count.  She has chronic thrombi so holding anticoagulants is likely not an option. Continue PPI as is- will discuss repeating egd with Dr Haig Prophet. Note she denies any history of connective tissue disorders and is not cirrhotic. She will need to avoid nsaids.  We will follow with you, further recommendations to follow.  Thank you very much for this consult. These services were provided by Stephens November, NP-C, in collaboration with Lesly Rubenstein MD, with whom I have discussed this patient in full.   Stephens November, NP-C

## 2022-02-25 NOTE — Progress Notes (Signed)
Pharmacy Antibiotic Note  Courtney Mack is a 66 y.o. female admitted on 02/23/2022 with bacteremia. Presented with concern for GIB and worseing lower extremity edema. Then, blood cultures growing 3/4 bottles with BCID detecting Staphylococcus epidermidis (mecA+). Pharmacy has been consulted for Vancomycin dosing.  Renal function stable and consistent with baseline. WBC WNL, afebrile.   Plan: Continue vancomycin 750 mg IV every 24 hours Goal AUC 400-600 Estimated AUC = 458.7, vanc trough = 11.7   Height: '4\' 11"'$  (149.9 cm) Weight: 79.4 kg (175 lb) IBW/kg (Calculated) : 43.2  Temp (24hrs), Avg:98.4 F (36.9 C), Min:98.1 F (36.7 C), Max:98.8 F (37.1 C)  Recent Labs  Lab 02/23/22 2327 02/24/22 0201 02/24/22 0234 02/24/22 0907 02/25/22 0414  WBC 5.3  --   --  4.6 5.1  CREATININE 0.89 0.82  --   --  0.84  LATICACIDVEN  --   --  0.7  --   --      Estimated Creatinine Clearance: 59.2 mL/min (by C-G formula based on SCr of 0.84 mg/dL).    No Known Allergies  Antimicrobials this admission: Vancomycin 8/21  >>   Dose adjustments this admission:   Microbiology results:  BCx: MRSE 3/4 bottles  UCx: in process   Thank you for allowing pharmacy to be a part of this patient's care.  Glean Salvo, PharmD Clinical Pharmacist  02/25/2022 7:27 AM

## 2022-02-25 NOTE — Assessment & Plan Note (Signed)
Nebulizer treatments, IV azithromycin.

## 2022-02-25 NOTE — Consult Note (Signed)
ANTICOAGULATION CONSULT NOTE - Consult  Pharmacy Consult for Heparin gtt Indication:  Acute on chronic DVT with low hemoglobin on admission  No Known Allergies  Patient Measurements: Height: '4\' 11"'$  (149.9 cm) Weight: 79.4 kg (175 lb) IBW/kg (Calculated) : 43.2 Heparin Dosing Weight: 61.6kg  Vital Signs: Temp: 98.2 F (36.8 C) (08/21 1610) Temp Source: Oral (08/21 1610) BP: 111/51 (08/21 1610) Pulse Rate: 73 (08/21 1610)  Labs: Recent Labs    02/23/22 2327 02/23/22 2358 02/24/22 0201 02/24/22 0907 02/25/22 0414  HGB 6.8*  --   --  7.2* 8.3*  HCT 23.1*  --   --  23.6* 26.7*  PLT 288  --   --  240 225  CREATININE 0.89  --  0.82  --  0.84  TROPONINIHS  --  4 5  --   --    Estimated Creatinine Clearance: 59.2 mL/min (by C-G formula based on SCr of 0.84 mg/dL).  Medications: Heparin Dosing Weight: 61.6kg PTA - Xarelto '10mg'$  QD (pt ran out approximately 2wks ago)  Assessment: 66yo female w/ h/o DM, Chronic DVT, Endometrial CA, diaCHF, Chronic blood loss anemia, GIB, gastric antral vascular ectasia, PE, lymphedema, IDA, HLD, presenting with headache, lightheadedness, SOB, and worsening of her chronic LE edema. PTA pt taking xarelto '10mg'$  QD. Pharmacy consulted for heparin gtt mgmt.  8/21 LE [place] - acute on chronic left common femoral vein DVT and a chronic partially occlusive thrombus right common femoral vein and proximal superficial femoral vein.  Date Time aPTT/HL Rate/Comment       Baseline Labs: aPTT - pending INR - pending Hgb - 6.8>7.2>trf 1u > 8.3 > (started heparin) Plts - [phone]   Goal of Therapy:  Heparin level 0.3-0.7 units/ml aPTT 66-102 seconds Monitor platelets by anticoagulation protocol: Yes   Plan:  Pt was historically therapeutic at lower limit of goal range 0.3-0.7 with 1000 un/hr so will start here and titrate upward. Pt reportedly last took xarelto over 2wks ago since they ran out of home supply. Give 4500 units bolus x1; then start heparin  infusion at 1000 units/hr Check aPTT/Anti-Xa level in 6 hours and daily once consecutively therapeutic.  Titrate by aPTT's until lab correlation is noted, then titrate by anti-xa alone. Continue to monitor H&H and platelets daily while on heparin gtt.  Shanon Brow Sharlie Shreffler 02/25/2022,5:11 PM

## 2022-02-25 NOTE — Assessment & Plan Note (Signed)
Heparin drip started to be managed by pharmacy.

## 2022-02-25 NOTE — Assessment & Plan Note (Signed)
2/2 culture have grown out staph epi. Infectious disease has been consulted. Dr. Delaine Lame recommends continuation of Vancomycin for now. Urine from left urostomy will be cultured. If it does not grow out staph epi, the Vanc can be stopped.

## 2022-02-25 NOTE — Procedures (Signed)
Interventional Radiology Procedure Note  Procedure: Left PCN Exchange  Indication: Obstructive Uropathy  Findings: Please refer to procedural dictation for full description.  Complications: None  EBL: < 10 mL  Miachel Roux, MD [phone]

## 2022-02-25 NOTE — Progress Notes (Signed)
PROGRESS NOTE  Courtney Mack XTK:[id number] DOB: 01/09/1956 DOA: 02/23/2022 PCP: [facility] [place]  Brief History   The patient is a 66 yr old woman who presented to [facility] ED on 02/23/2022 with complaints of lightheadedness, headache, shortness of breath and worsening of her chronic lower extremity edema. She was found to have a low blood pressure with systolics in the 99-24 range. Her hemoglobin was 6.8, potassium was 3.3, and she was guaiac positive.    Lower extremity doppler demonstrated acute on chronic left common femoral vein DVT and a chronic partially occlusive thrombus right common femoral vein and proximal superficial femoral vein.   CTA chest and abdomen demonstrated no acute pulmonary embolus. There was stable minimal chronic pulmonary embolus in right lower lobar pulmonary artery. There was evidence for extensive bronchitis and airway impaction in the lower lobes bilaterally with endobronchial debris suggesting superimposed infection or aspiration. There is also evidence for small airway disease. There is a left percutaneous nephrostomy catheter in expected position. There is no hydronephrosis. There was suggestion of cystitis with marked circumferential stranding. 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 This may be related to progressive subcutaneous body wall edema. There is also peripheral vascular disease with hemodynamically significantly stenosis of the celiac axis and superior mesenteric artery proximally. Suggestion is to correlate clinically for signs and symptoms of chronic mesenteric ischemia. Suggestion is also for CT arteriography.   The patient was admitted to a telemetry bed by my colleague, Dr. Damita Dunnings.   The patient will receive 1 unit of PRBC's in transfusion. FOBT was positive.    I have seen and examined this patient myself. I have reviewed her  labwork, imagery, and vitals. The patient is pale. She is awake, alert, and oriented x 3. No acute distress. Heart and lung exam is within normal limits. Abdomen is soft, non-tender, non-distended. Extremities are positive for 3+ pitting edema bilaterally. Neurological: The patient is awake, alert, and oriented x 3. She is moving all extremities.  She has been started on a heparin drip for acute on chronic DVT.  2/2 blood cultures have grown out staph epi. The patient has been started on vancomycin. Infectious disease has been consulted. And TTE has been ordered.  Consultants  None  Procedures  Left nephrostomy tube exchanged by IR today.  Antibiotics   Anti-infectives (From admission, onward)    Start     Dose/Rate Route Frequency Ordered Stop   02/26/22 0100  vancomycin (VANCOREADY) IVPB 750 mg/150 mL        750 mg 150 mL/hr over 60 Minutes Intravenous Every 24 hours 02/25/22 0205     02/25/22 0200  vancomycin (VANCOREADY) IVPB 1750 mg/350 mL        1,750 mg 175 mL/hr over 120 Minutes Intravenous  Once 02/25/22 0109 02/25/22 0342   02/24/22 0230  ceFEPIme (MAXIPIME) 2 g in sodium chloride 0.9 % 100 mL IVPB        2 g 200 mL/hr over 30 Minutes Intravenous  Once 02/24/22 0224 02/24/22 0333   02/24/22 0230  metroNIDAZOLE (FLAGYL) IVPB 500 mg        500 mg 100 mL/hr over 60 Minutes Intravenous  Once 02/24/22 0224 02/24/22 0358        Interval History/Subjective  The patient is resting comfortably. No new complaints.  Objective   Vitals:  Vitals:   02/25/22 0851 02/25/22 1610  BP: 113/61 (!) 111/51  Pulse: 71 73  Resp: 16 16  Temp: 98.1 F (36.7 C) 98.2 F (36.8 C)  SpO2: 95% 97%   Exam:  Constitutional:  The patient is awake, alert, and oriented x 3. No acute distress. Respiratory:  No increased work of breathing. No wheezes, rales, or rhonchi No tactile fremitus Cardiovascular:  Regular rate and rhythm No murmurs, ectopy, or gallups. No lateral PMI. No  thrills. Abdomen:  Abdomen is soft, non-tender, non-distended No hernias, masses, or organomegaly Normoactive bowel sounds.  Musculoskeletal:  No cyanosis, clubbing, or edema Skin:  No rashes, lesions, ulcers palpation of skin: no induration or nodules Neurologic:  CN 2-12 intact Sensation all 4 extremities intact Psychiatric:  Mental status Mood, affect appropriate Orientation to person, place, time  judgment and insight appear intact   I have personally reviewed the following:   Today's Data  Vitals  Lab Data  CBC BMP  Micro Data  Blood cultures x 2 have grown out Staph epi.  Imaging  CTA chest  CT Abdomen and pelvis Doppler of lower extremities bilaterally.  Cardiology Data  EKG  Other Data    Scheduled Meds:  sodium chloride   Intravenous Once   pantoprazole (PROTONIX) IV  40 mg Intravenous Q24H   Continuous Infusions:  sodium chloride 10 mL/hr at 02/25/22 0349   heparin     [START ON 02/26/2022] vancomycin      Active Problems:   Symptomatic anemia due to chronic blood loss   Hypotension   Gastric antral vascular ectasia   Diabetes mellitus without complication (HCC)   Chronic diastolic CHF (congestive heart failure) (HCC)   Endometrial cancer (HCC)   Acute on chronic right femoral DVT (deep venous thrombosis) ([place])   Bilateral hydronephrosis s/p left nephrostomy tube June 2023   Chronic radiation cystitis   History of pulmonary embolus (PE)   Lymphedema   Symptomatic anemia   Acute bronchitis   Staphylococcus epidermidis bacteremia Acute Bronchitis.  LOS: 1 day   A & P  Assessment and Plan: Symptomatic anemia due to chronic blood loss History of upper GI bleed 7/25/EGD with GAVE s/p APC History of radiation cystitis with intermittent hematuria Chronic anticoagulation Hemoglobin 6.8, downtrending from 7.8 about 3 weeks prior. Etiology suspect GI given positive stool guaiac in the ED, lower suspicion for GU etiology given clear urine in  nephrostomy bag Continue transfusion of 1 unit PRBC started in the emergency room. Post transfusion hemoglobin was 8.0. Serial H&H and transfuse as necessary Hold Xarelto IV Protonix. Consider GI consult.  Consider urology consult  Hypotension Secondary to hypovolemia from ongoing blood loss, diuretic treatment IV hydration to keep MAP over 65 Hold antihypertensives  Diabetes mellitus without complication (HCC) Sliding scale insulin coverage  Chronic diastolic CHF (congestive heart failure) (HCC) Compensated Hold Lasix due to hypotension  Endometrial cancer [facility]) Oncology follow up  Staphylococcus epidermidis bacteremia 2/2 culture have grown out staph epi. Infectious disease has been consulted. Dr. Delaine Lame recommends continuation of Vancomycin for now. Urine from left urostomy will be cultured. If it does not grow out staph epi, the Vanc can be stopped.  Acute bronchitis Nebulizer treatments, IV azithromycin.  Symptomatic anemia The patient received one unit of PRBC's in transfusion. GI has been consulted as the patient has had multiple recent GIB issues. PPI and supportive care recommended.  Lymphedema Holding Lasix due to soft blood pressure  History of pulmonary embolus (PE) Heparin drip started to be managed by pharmacy.  Bilateral hydronephrosis  s/p left nephrostomy tube June 2023 Chronic radiation cystitis with intermittent hematuria s/p fulguration June 2023 Possible UTI UA with moderate leukocyte esterases but normal WBC and lactic acid and procalcitonin Patient received cefepime and Flagyl in the ED Urostomy tube to be evaluated by IR today.  Acute on chronic right femoral DVT (deep venous thrombosis) (HCC) Chronic PE History of IVC filter Heparin drip started.   I have seen and examined this patient myself. I have spent 34 minutes in her evaluation and care.  DVT prophylaxis: Heparin gtt Code Status: Full Code Family Communication: Family at  bedside. Disposition Plan: tbd    Melvin Whiteford, DO [facility] Direct contact: see [email or web address]  7PM-7AM contact night coverage as above 02/25/2022, 5:28 PM  LOS: 1 day

## 2022-02-25 NOTE — Progress Notes (Signed)
PHARMACY - PHYSICIAN COMMUNICATION CRITICAL VALUE ALERT - BLOOD CULTURE IDENTIFICATION (BCID)  Courtney Mack is an 66 y.o. female who presented to [facility] on 02/23/2022 with a chief complaint of GI bleed, blood loss.  Assessment:  Staph epi in 2nd of 2 bottles, Mec A detected.    (include suspected source if known)  Name of physician (or Provider) Contacted: Neomia Glass, NP  Current antibiotics: none  Changes to prescribed antibiotics recommended:  Discussed with NP, suggested starting Vanc,  NP feels that given that pt is afebrile and WBC are not elevated this could still be contaminant and does not want to start abx at this time; would rather defer to attending on 8/21 AM.     Results for orders placed or performed during the hospital encounter of 02/23/22  Blood Culture ID Panel (Reflexed) (Collected: 02/24/2022  2:35 AM)  Result Value Ref Range   Enterococcus faecalis NOT DETECTED NOT DETECTED   Enterococcus Faecium NOT DETECTED NOT DETECTED   Listeria monocytogenes NOT DETECTED NOT DETECTED   Staphylococcus species DETECTED (A) NOT DETECTED   Staphylococcus aureus (BCID) NOT DETECTED NOT DETECTED   Staphylococcus epidermidis DETECTED (A) NOT DETECTED   Staphylococcus lugdunensis NOT DETECTED NOT DETECTED   Streptococcus species NOT DETECTED NOT DETECTED   Streptococcus agalactiae NOT DETECTED NOT DETECTED   Streptococcus pneumoniae NOT DETECTED NOT DETECTED   Streptococcus pyogenes NOT DETECTED NOT DETECTED   A.calcoaceticus-baumannii NOT DETECTED NOT DETECTED   Bacteroides fragilis NOT DETECTED NOT DETECTED   Enterobacterales NOT DETECTED NOT DETECTED   Enterobacter cloacae complex NOT DETECTED NOT DETECTED   Escherichia coli NOT DETECTED NOT DETECTED   Klebsiella aerogenes NOT DETECTED NOT DETECTED   Klebsiella oxytoca NOT DETECTED NOT DETECTED   Klebsiella pneumoniae NOT DETECTED NOT DETECTED   Proteus species NOT DETECTED NOT DETECTED   Salmonella species NOT  DETECTED NOT DETECTED   Serratia marcescens NOT DETECTED NOT DETECTED   Haemophilus influenzae NOT DETECTED NOT DETECTED   Neisseria meningitidis NOT DETECTED NOT DETECTED   Pseudomonas aeruginosa NOT DETECTED NOT DETECTED   Stenotrophomonas maltophilia NOT DETECTED NOT DETECTED   Candida albicans NOT DETECTED NOT DETECTED   Candida auris NOT DETECTED NOT DETECTED   Candida glabrata NOT DETECTED NOT DETECTED   Candida krusei NOT DETECTED NOT DETECTED   Candida parapsilosis NOT DETECTED NOT DETECTED   Candida tropicalis NOT DETECTED NOT DETECTED   Cryptococcus neoformans/gattii NOT DETECTED NOT DETECTED   Methicillin resistance mecA/C DETECTED (A) NOT DETECTED    Filemon Breton D 02/25/2022  12:40 AM

## 2022-02-25 NOTE — Consult Note (Signed)
NAME: Courtney Mack  DOB: March 25, 1956  MRN: [id number]  Date/Time: 02/25/2022 11:49 AM  REQUESTING PROVIDER: Dr. Benny Lennert Subjective:  REASON FOR CONSULT: MRSE in blood culture ? Courtney Mack is a 66 y.o. with a history of endometrial carcinoma , radiation cystitis, left nephrostomy tube for hydronephrosis on 12/27/21 , Anemia secondary to GAVE, DVT, PE, IVC filter Presents from home on 02/23/22 with dizziness, weakness, SOB and leg edema.She deos not have any fever or chills or dysuria. She has a baselinecough Pt was recently in hospital between 7/25-7/29 for similar complaints acute anemia with Hb of 4.7and received 3 units of PRBC and Endoscopy showed - Gastric antral vascular ectasia without bleeding. Was treated with argon plasma coagulation (APC). Xarelto was on hold for a few days and was restarted 6/19-6/26 was hospitalized for hematuria thought to be a combination of radiation cystitis, xarelto and ureteral stent.UC VRE and she was sent home on linezolid Follow up with Urologist on 6/29 for stent removal, which she removed herself as it was coming out  Past Medical History:  Diagnosis Date   Acute bilateral deep vein thrombosis (DVT) of femoral veins ([place]) 01/02/2021   Acute massive pulmonary embolism ([place]) 08/25/2020   Last Assessment & Plan:  Formatting of this note might be different from the original. Post vena caval filter and on xarelto and O2   Anemia    ARF (acute respiratory failure) ([place])    Cellulitis of left lower leg 06/20/2021   CHF (congestive heart failure) ([place])    COVID-19    Diabetes mellitus without complication ([place])    DVT (deep venous thrombosis) ([place]) 09/06/2020   Endometrial cancer ([facility])    GAVE (gastric antral vascular ectasia)    GERD (gastroesophageal reflux disease)    High cholesterol    Hx of blood clots    Hyperlipidemia    Hypertension    IDA (iron deficiency anemia) 09/21/2020   Obesity    Pressure injury of skin 06/21/2021    Pulmonary embolism ([place])    Sepsis ([place])    Thrombocytopenia ([place])    Urinary tract infection 09/13/2021    Past Surgical History:  Procedure Laterality Date   COLONOSCOPY N/A 06/21/2021   Procedure: COLONOSCOPY;  Surgeon: Toledo, Benay Pike, MD;  Location: [facility] ENDOSCOPY;  Service: Gastroenterology;  Laterality: N/A;   CYSTOSCOPY W/ URETERAL STENT PLACEMENT Left 09/11/2021   Procedure: CYSTOSCOPY WITH RETROGRADE PYELOGRAM/URETERAL STENT PLACEMENT;  Surgeon: Abbie Sons, MD;  Location: [facility] ORS;  Service: Urology;  Laterality: Left;   CYSTOSCOPY W/ URETERAL STENT PLACEMENT Left 11/17/2021   Procedure: CYSTOSCOPY WITH STENT REPLACEMENT;  Surgeon: Irine Seal, MD;  Location: [facility] ORS;  Service: Urology;  Laterality: Left;   CYSTOSCOPY WITH FULGERATION  11/17/2021   Procedure: CYSTOSCOPY WITH FULGERATION;  Surgeon: Irine Seal, MD;  Location: [facility] ORS;  Service: Urology;;   CYSTOSCOPY WITH STENT PLACEMENT Left 10/17/2021   Procedure: CYSTOSCOPY WITH STENT PLACEMENT;  Surgeon: Abbie Sons, MD;  Location: [facility] ORS;  Service: Urology;  Laterality: Left;   CYSTOSCOPY WITH URETEROSCOPY, STONE BASKETRY AND STENT PLACEMENT Left 10/16/2021   Procedure: CYSTOSCOPY WITH URETEROSCOPY  AND STENT REMOVAL;  Surgeon: Abbie Sons, MD;  Location: [facility] ORS;  Service: Urology;  Laterality: Left;   ENTEROSCOPY N/A 08/17/2021   Procedure: ENTEROSCOPY;  Surgeon: Lin Landsman, MD;  Location: [facility] ENDOSCOPY;  Service: Gastroenterology;  Laterality: N/A;   ESOPHAGOGASTRODUODENOSCOPY N/A 06/21/2021   Procedure: ESOPHAGOGASTRODUODENOSCOPY (EGD);  Surgeon: Alice Reichert, Benay Pike, MD;  Location:  [facility] ENDOSCOPY;  Service: Gastroenterology;  Laterality: N/A;   ESOPHAGOGASTRODUODENOSCOPY (EGD) WITH PROPOFOL N/A 08/17/2021   Procedure: ESOPHAGOGASTRODUODENOSCOPY (EGD) WITH PROPOFOL;  Surgeon: Lin Landsman, MD;  Location: [facility] ENDOSCOPY;  Service: Gastroenterology;  Laterality: N/A;   ESOPHAGOGASTRODUODENOSCOPY  (EGD) WITH PROPOFOL N/A 10/14/2021   Procedure: ESOPHAGOGASTRODUODENOSCOPY (EGD) WITH PROPOFOL;  Surgeon: [name], MD;  Location: [facility] ENDOSCOPY;  Service: Endoscopy;  Laterality: N/A;   ESOPHAGOGASTRODUODENOSCOPY (EGD) WITH PROPOFOL N/A 02/01/2022   Procedure: ESOPHAGOGASTRODUODENOSCOPY (EGD) WITH PROPOFOL;  Surgeon: Lin Landsman, MD;  Location: [facility] ENDOSCOPY;  Service: Gastroenterology;  Laterality: N/A;   GIVENS CAPSULE STUDY N/A 06/22/2021   Procedure: GIVENS CAPSULE STUDY;  Surgeon: Toledo, Benay Pike, MD;  Location: [facility] ENDOSCOPY;  Service: Gastroenterology;  Laterality: N/A;   IR NEPHROSTOMY PLACEMENT LEFT  12/27/2021   IVC FILTER INSERTION N/A 08/18/2020   Procedure: IVC FILTER INSERTION;  Surgeon: Katha Cabal, MD;  Location: [place] CV LAB;  Service: Cardiovascular;  Laterality: N/A;   PERIPHERAL VASCULAR THROMBECTOMY Bilateral 01/03/2021   Procedure: PERIPHERAL VASCULAR THROMBECTOMY;  Surgeon: Algernon Huxley, MD;  Location: [place] CV LAB;  Service: Cardiovascular;  Laterality: Bilateral;   PULMONARY THROMBECTOMY N/A 08/18/2020   Procedure: PULMONARY THROMBECTOMY;  Surgeon: Katha Cabal, MD;  Location: [place] CV LAB;  Service: Cardiovascular;  Laterality: N/A;   TUBAL LIGATION     VISCERAL ANGIOGRAPHY N/A 07/18/2021   Procedure: VISCERAL ANGIOGRAPHY;  Surgeon: Algernon Huxley, MD;  Location: [place] CV LAB;  Service: Cardiovascular;  Laterality: N/A;    Social History   Socioeconomic History   Marital status: Single    Spouse name: Not on file   Number of children: Not on file   Years of education: Not on file   Highest education level: Not on file  Occupational History   Not on file  Tobacco Use   Smoking status: Never   Smokeless tobacco: Never   Tobacco comments:    smoked 2-4 cigarettes for about 2-3 weeks   Vaping Use   Vaping Use: Never used  Substance and Sexual Activity   Alcohol use: Not Currently   Drug use: Not Currently    Sexual activity: Not Currently  Other Topics Concern   Not on file  Social History Narrative   Not on file   Social Determinants of Health   Financial Resource Strain: Not on file  Food Insecurity: Not on file  Transportation Needs: Not on file  Physical Activity: Not on file  Stress: Not on file  Social Connections: Not on file  Intimate Partner Violence: Not on file    Family History  Problem Relation Age of Onset   Hypertension Mother    Diabetes Mother    Diabetes Father    Hypertension Father    High Cholesterol Father    Congestive Heart Failure Father    Breast cancer Cousin    No Known Allergies I? Current Facility-Administered Medications  Medication Dose Route Frequency Provider Last Rate Last Admin   0.9 %  sodium chloride infusion (Manually program via Guardrails IV Fluids)   Intravenous Once Swayze, Ava, DO   Held at 02/24/22 1156   0.9 %  sodium chloride infusion   Intravenous PRN Swayze, Ava, DO 10 mL/hr at 02/25/22 0349 Infusion Verify at 02/25/22 0349   acetaminophen (TYLENOL) tablet 650 mg  650 mg Oral Q6H PRN Athena Masse, MD       Or   acetaminophen (TYLENOL) suppository 650 mg  650 mg Rectal Q6H PRN Athena Masse, MD       ondansetron [facility]) tablet 4 mg  4 mg Oral Q6H PRN Athena Masse, MD       Or   ondansetron [facility]) injection 4 mg  4 mg Intravenous Q6H PRN Athena Masse, MD       pantoprazole (PROTONIX) injection 40 mg  40 mg Intravenous Q24H Athena Masse, MD   40 mg at 02/25/22 0345   [START ON 02/26/2022] vancomycin (VANCOREADY) IVPB 750 mg/150 mL  750 mg Intravenous Q24H Swayze, Ava, DO         Abtx:  Anti-infectives (From admission, onward)    Start     Dose/Rate Route Frequency Ordered Stop   02/26/22 0100  vancomycin (VANCOREADY) IVPB 750 mg/150 mL        750 mg 150 mL/hr over 60 Minutes Intravenous Every 24 hours 02/25/22 0205     02/25/22 0200  vancomycin (VANCOREADY) IVPB 1750 mg/350 mL        1,750 mg 175 mL/hr  over 120 Minutes Intravenous  Once 02/25/22 0109 02/25/22 0342   02/24/22 0230  ceFEPIme (MAXIPIME) 2 g in sodium chloride 0.9 % 100 mL IVPB        2 g 200 mL/hr over 30 Minutes Intravenous  Once 02/24/22 0224 02/24/22 0333   02/24/22 0230  metroNIDAZOLE (FLAGYL) IVPB 500 mg        500 mg 100 mL/hr over 60 Minutes Intravenous  Once 02/24/22 0224 02/24/22 0358       REVIEW OF SYSTEMS:  Const: negative fever, negative chills, negative weight loss Eyes: negative diplopia or visual changes, negative eye pain ENT: negative coryza, negative sore throat Resp: +cough, no hemoptysis, dyspnea Cards: negative for chest pain, palpitations, lower extremity edema GU: negative for frequency, dysuria and hematuria GI: Negative for abdominal pain, diarrhea, bleeding, constipation Skin: negative for rash and pruritus Heme: negative for easy bruising and gum/nose bleeding MS: weakness Neurolo:, dizziness,  Psych: negative for feelings of anxiety, depression  Endocrine:  diabetes Allergy/Immunology- negative for any medication or food allergies  Objective:  VITALS:  BP 113/61 (BP Location: Right Arm)   Pulse 71   Temp 98.1 F (36.7 C) (Oral)   Resp 16   Ht '4\' 11"'$  (1.499 m)   Wt 79.4 kg   SpO2 95%   BMI 35.35 kg/m   PHYSICAL EXAM:  General: Alert, cooperative, no distress, appears stated age. pale Head: Normocephalic, without obvious abnormality, atraumatic. Eyes: Conjunctivae clear, anicteric sclerae. Pupils are equal ENT Nares normal. No drainage or sinus tenderness. Lips, mucosa, and tongue normal. No Thrush Neck: Supple, symmetrical, no adenopathy, thyroid: non tender no carotid bruit and no JVD. Back: No CVA tenderness. Lungs: Clear to auscultation bilaterally. No Wheezing or Rhonchi. No rales. Heart: Regular rate and rhythm, no murmur, rub or gallop. Abdomen: Soft, non-tender,not distended. Bowel sounds normal. No masses Left nephrostomy- clear urine Extremities: edema  feet Skin: No rashes or lesions. Or bruising Lymph: Cervical, supraclavicular normal. Neurologic: Grossly non-focal Pertinent Labs Lab Results CBC    Component Value Date/Time   WBC 5.1 02/25/2022 0414   RBC 3.17 (L) 02/25/2022 0414   HGB 8.3 (L) 02/25/2022 0414   HCT 26.7 (L) 02/25/2022 0414   PLT 225 02/25/2022 0414   MCV 84.2 02/25/2022 0414   MCH 26.2 02/25/2022 0414   MCHC 31.1 02/25/2022 0414   RDW 18.0 (H) 02/25/2022 0414   LYMPHSABS 0.9 02/25/2022 0414  MONOABS 0.3 02/25/2022 0414   EOSABS 0.4 02/25/2022 0414   BASOSABS 0.0 02/25/2022 0414       Latest Ref Rng & Units 02/25/2022    4:14 AM 02/24/2022    2:01 AM 02/23/2022   11:27 PM  CMP  Glucose 70 - 99 mg/dL 95  103  152   BUN 8 - 23 mg/dL '18  17  18   '$ Creatinine 0.44 - 1.00 mg/dL 0.84  0.82  0.89   Sodium 135 - 145 mmol/L 139  140  139   Potassium 3.5 - 5.1 mmol/L 4.1  3.2  3.3   Chloride 98 - 111 mmol/L 110  109  105   CO2 22 - 32 mmol/L '24  23  27   '$ Calcium 8.9 - 10.3 mg/dL 8.2  7.9  8.6   Total Protein 6.5 - 8.1 g/dL   6.7   Total Bilirubin 0.3 - 1.2 mg/dL   0.7   Alkaline Phos 38 - 126 U/L   41   AST 15 - 41 U/L   22   ALT 0 - 44 U/L   11       Microbiology: Recent Results (from the past 240 hour(s))  SARS Coronavirus 2 by RT PCR (hospital order, performed in [place] hospital lab) *cepheid single result test* Anterior Nasal Swab     Status: None   Collection Time: 02/24/22 12:38 AM   Specimen: Anterior Nasal Swab  Result Value Ref Range Status   SARS Coronavirus 2 by RT PCR NEGATIVE NEGATIVE Final    Comment: (NOTE) SARS-CoV-2 target nucleic acids are NOT DETECTED.  The SARS-CoV-2 RNA is generally detectable in upper and lower respiratory specimens during the acute phase of infection. The lowest concentration of SARS-CoV-2 viral copies this assay can detect is 250 copies / mL. A negative result does not preclude SARS-CoV-2 infection and should not be used as the sole basis for treatment or  other patient management decisions.  A negative result may occur with improper specimen collection / handling, submission of specimen other than nasopharyngeal swab, presence of viral mutation(s) within the areas targeted by this assay, and inadequate number of viral copies (<250 copies / mL). A negative result must be combined with clinical observations, patient history, and epidemiological information.  Fact Sheet for Patients:   https://www.patel.info/  Fact Sheet for Healthcare Providers: https://hall.com/  This test is not yet approved or  cleared by the [place] FDA and has been authorized for detection and/or diagnosis of SARS-CoV-2 by FDA under an Emergency Use Authorization (EUA).  This EUA will remain in effect (meaning this test can be used) for the duration of the COVID-19 declaration under Section 564(b)(1) of the Act, 21 U.S.C. section 360bbb-3(b)(1), unless the authorization is terminated or revoked sooner.  Performed at [facility], [address] [place], [place] [address]   Urine Culture     Status: Abnormal   Collection Time: 02/24/22  2:01 AM   Specimen: Urine, Clean Catch  Result Value Ref Range Status   Specimen Description   Final    URINE, CLEAN CATCH Performed at [facility], [address] [place], [place] [address]    Special Requests   Final    NONE Performed at [facility], [address] [place], [place] [address]    Culture MULTIPLE SPECIES PRESENT, SUGGEST RECOLLECTION (A)  Final   Report Status 02/25/2022 FINAL  Final  Blood culture (routine x 2)  Status: None (Preliminary result)   Collection Time: 02/24/22  2:35 AM   Specimen: BLOOD  Result Value Ref Range Status   Specimen Description   Final    BLOOD LEFT FA Performed at [facility], [address] [place], [place] [address]    Special Requests   Final    BOTTLES DRAWN AEROBIC AND  ANAEROBIC Blood Culture results may not be optimal due to an inadequate volume of blood received in culture bottles Performed at [facility], [address] [place], [place] [address]    Culture  Setup Time   Final    GRAM POSITIVE COCCI IN BOTH AEROBIC AND ANAEROBIC BOTTLES CRITICAL RESULT CALLED TO, READ BACK BY AND VERIFIED WITH: JASON ROBBINS 02/25/22 0051 DE    Culture GRAM POSITIVE COCCI  Final   Report Status PENDING  Incomplete  Blood culture (routine x 2)     Status: None (Preliminary result)   Collection Time: 02/24/22  2:35 AM   Specimen: BLOOD  Result Value Ref Range Status   Specimen Description   Final    BLOOD LEFT UPPER ARM Performed at [facility], [address], [place], [place] [address]    Special Requests   Final    BOTTLES DRAWN AEROBIC AND ANAEROBIC Blood Culture adequate volume Performed at [facility], [address] [place], [place] [address]    Culture  Setup Time   Final    Organism ID to follow [place] CRITICAL RESULT CALLED TO, READ BACK BY AND VERIFIED WITH: JASON ROBBINS 02/24/22 2156 DE    Culture   Final    GRAM POSITIVE COCCI AEROBIC BOTTLE ONLY CRITICAL RESULT CALLED TO, READ BACK BY AND VERIFIED WITH: JASON ROBBINS 02/24/22 2309 DE Performed at [facility], [address] [place], [place] [address]    Report Status PENDING  Incomplete  Blood Culture ID Panel (Reflexed)     Status: Abnormal   Collection Time: 02/24/22  2:35 AM  Result Value Ref Range Status   Enterococcus faecalis NOT DETECTED NOT DETECTED Final   Enterococcus Faecium NOT DETECTED NOT DETECTED Final   Listeria monocytogenes NOT DETECTED NOT DETECTED Final   Staphylococcus species DETECTED (A) NOT DETECTED Final    Comment: CRITICAL RESULT CALLED TO, READ BACK BY AND VERIFIED WITH: JASON ROBBINS PHARMD AT 2156 02/24/2022 DE    Staphylococcus aureus (BCID) NOT DETECTED NOT DETECTED Final    Staphylococcus epidermidis DETECTED (A) NOT DETECTED Final    Comment: Methicillin (oxacillin) resistant coagulase negative staphylococcus. Possible blood culture contaminant (unless isolated from more than one blood culture draw or clinical case suggests pathogenicity). No antibiotic treatment is indicated for blood  culture contaminants. CRITICAL RESULT CALLED TO, READ BACK BY AND VERIFIED WITH: Violeta Gelinas PHARMD AT 2156 02/24/2022 DE    Staphylococcus lugdunensis NOT DETECTED NOT DETECTED Final   Streptococcus species NOT DETECTED NOT DETECTED Final   Streptococcus agalactiae NOT DETECTED NOT DETECTED Final   Streptococcus pneumoniae NOT DETECTED NOT DETECTED Final   Streptococcus pyogenes NOT DETECTED NOT DETECTED Final   A.calcoaceticus-baumannii NOT DETECTED NOT DETECTED Final   Bacteroides fragilis NOT DETECTED NOT DETECTED Final   Enterobacterales NOT DETECTED NOT DETECTED Final   Enterobacter cloacae complex NOT DETECTED NOT DETECTED Final   Escherichia coli NOT DETECTED NOT DETECTED Final   Klebsiella aerogenes NOT DETECTED NOT DETECTED Final   Klebsiella oxytoca NOT DETECTED NOT DETECTED Final   Klebsiella pneumoniae NOT DETECTED NOT  DETECTED Final   Proteus species NOT DETECTED NOT DETECTED Final   Salmonella species NOT DETECTED NOT DETECTED Final   Serratia marcescens NOT DETECTED NOT DETECTED Final   Haemophilus influenzae NOT DETECTED NOT DETECTED Final   Neisseria meningitidis NOT DETECTED NOT DETECTED Final   Pseudomonas aeruginosa NOT DETECTED NOT DETECTED Final   Stenotrophomonas maltophilia NOT DETECTED NOT DETECTED Final   Candida albicans NOT DETECTED NOT DETECTED Final   Candida auris NOT DETECTED NOT DETECTED Final   Candida glabrata NOT DETECTED NOT DETECTED Final   Candida krusei NOT DETECTED NOT DETECTED Final   Candida parapsilosis NOT DETECTED NOT DETECTED Final   Candida tropicalis NOT DETECTED NOT DETECTED Final   Cryptococcus neoformans/gattii NOT  DETECTED NOT DETECTED Final   Methicillin resistance mecA/C DETECTED (A) NOT DETECTED Final    Comment: CRITICAL RESULT CALLED TO, READ BACK BY AND VERIFIED WITHVioleta Gelinas PHARMD AT 2156 02/24/2022 DE Performed at [facility], [address] [place], [place] [address]     IMAGING RESULTS:  I have personally reviewed the films ?CXR- no infiltrate  Impression/Recommendation ? ?Symptomatic anemia- HB 6.8-received PRBC H/o GAVE -   Staph epidermidis - in blood culture- patient says it was taken from the peripheral line- likely a contaminant If Urine from left kidney ( nephrostomy) is negative then we can DC the antibiotic. Continue vanco for now  Left hydronephrosis due to ureteral stricture has resolved She has Left Percutaneous nephrostomy which was changed today  H/o endometrial carcinoma s/p radiation  Radiation cystitis  H/o DVT/PE_ has IVC filter and on xarelto    ? ___________________________________________________ Discussed with patient and her nurse Note:  This document was prepared using Dragon voice recognition software and may include unintentional dictation errors.

## 2022-02-25 NOTE — Assessment & Plan Note (Addendum)
The patient received one unit of PRBC's in transfusion. GI has been consulted as the patient has had multiple recent GIB issues. PPI and supportive care recommended.

## 2022-02-25 NOTE — Progress Notes (Signed)
       CROSS COVER NOTE  NAME: Courtney Mack MRN: [id number] DOB : 26-Jun-1956    Date of Service   02/25/22  HPI/Events of Note   Notifed by pharmacy of BCID results. [name] has grown Staph Epi, mecA detected in 3/4 bottles. She is currently afebrile and without leukocytosis, possible this is contaminant.  Interventions   Plan: Vancomycin per pharmacy     This document was prepared using Dragon voice recognition software and may include unintentional dictation errors.  Neomia Glass DNP, MHA, FNP-BC Nurse Practitioner [facility] Pager [phone]

## 2022-02-26 ENCOUNTER — Inpatient Hospital Stay: Payer: Medicaid Other | Attending: Nurse Practitioner

## 2022-02-26 DIAGNOSIS — Z86718 Personal history of other venous thrombosis and embolism: Secondary | ICD-10-CM | POA: Diagnosis not present

## 2022-02-26 DIAGNOSIS — R7881 Bacteremia: Secondary | ICD-10-CM | POA: Diagnosis not present

## 2022-02-26 DIAGNOSIS — D500 Iron deficiency anemia secondary to blood loss (chronic): Secondary | ICD-10-CM | POA: Diagnosis not present

## 2022-02-26 DIAGNOSIS — Z8589 Personal history of malignant neoplasm of other organs and systems: Secondary | ICD-10-CM

## 2022-02-26 DIAGNOSIS — D649 Anemia, unspecified: Secondary | ICD-10-CM | POA: Diagnosis not present

## 2022-02-26 DIAGNOSIS — B957 Other staphylococcus as the cause of diseases classified elsewhere: Secondary | ICD-10-CM | POA: Diagnosis not present

## 2022-02-26 DIAGNOSIS — I9589 Other hypotension: Secondary | ICD-10-CM | POA: Diagnosis not present

## 2022-02-26 DIAGNOSIS — Z86711 Personal history of pulmonary embolism: Secondary | ICD-10-CM | POA: Diagnosis not present

## 2022-02-26 LAB — CBC
HCT: 27.6 % — ABNORMAL LOW (ref 36.0–46.0)
Hemoglobin: 8.5 g/dL — ABNORMAL LOW (ref 12.0–15.0)
MCH: 26.1 pg (ref 26.0–34.0)
MCHC: 30.8 g/dL (ref 30.0–36.0)
MCV: 84.7 fL (ref 80.0–100.0)
Platelets: 219 10*3/uL (ref 150–400)
RBC: 3.26 MIL/uL — ABNORMAL LOW (ref 3.87–5.11)
RDW: 17.8 % — ABNORMAL HIGH (ref 11.5–15.5)
WBC: 4.6 10*3/uL (ref 4.0–10.5)
nRBC: 0 % (ref 0.0–0.2)

## 2022-02-26 LAB — BASIC METABOLIC PANEL
Anion gap: 5 (ref 5–15)
BUN: 15 mg/dL (ref 8–23)
CO2: 25 mmol/L (ref 22–32)
Calcium: 8.4 mg/dL — ABNORMAL LOW (ref 8.9–10.3)
Chloride: 110 mmol/L (ref 98–111)
Creatinine, Ser: 0.74 mg/dL (ref 0.44–1.00)
GFR, Estimated: >60 mL/min (ref >60)
Glucose, Bld: 96 mg/dL (ref 70–99)
Potassium: 3.9 mmol/L (ref 3.5–5.1)
Sodium: 140 mmol/L (ref 135–145)

## 2022-02-26 LAB — URINE CULTURE

## 2022-02-26 LAB — HEPARIN LEVEL (UNFRACTIONATED)
Heparin Unfractionated: 0.14 IU/mL — ABNORMAL LOW (ref 0.30–0.70)
Heparin Unfractionated: 0.38 IU/mL (ref 0.30–0.70)
Heparin Unfractionated: 0.42 IU/mL (ref 0.30–0.70)

## 2022-02-26 MED ORDER — HEPARIN BOLUS VIA INFUSION
1800.0000 [IU] | Freq: Once | INTRAVENOUS | Status: AC
  Administered 2022-02-26: 1800 [IU] via INTRAVENOUS
  Filled 2022-02-26: qty 1800

## 2022-02-26 MED ORDER — FLUTICASONE FUROATE-VILANTEROL 100-25 MCG/ACT IN AEPB
1.0000 | INHALATION_SPRAY | Freq: Every day | RESPIRATORY_TRACT | Status: DC
  Administered 2022-02-26: 1 via RESPIRATORY_TRACT
  Filled 2022-02-26: qty 28

## 2022-02-26 MED ORDER — ROSUVASTATIN CALCIUM 10 MG PO TABS
10.0000 mg | ORAL_TABLET | Freq: Every day | ORAL | Status: DC
  Administered 2022-02-26 – 2022-03-01 (×4): 10 mg via ORAL
  Filled 2022-02-26 (×4): qty 1

## 2022-02-26 NOTE — Progress Notes (Signed)
Date of Admission:  02/23/2022    ID: Courtney Mack is a 66 y.o. female  Active Problems:   Diabetes mellitus without complication ([facility])   Acute on chronic right femoral DVT (deep venous thrombosis) ([facility])   Endometrial cancer ([facility])   Chronic diastolic CHF (congestive heart failure) ([facility])   Symptomatic anemia due to chronic blood loss   Gastric antral vascular ectasia   Bilateral hydronephrosis s/p left nephrostomy tube June 2023   Chronic radiation cystitis   History of pulmonary embolus (PE)   Lymphedema   Symptomatic anemia   Hypotension   Acute bronchitis   Staphylococcus epidermidis bacteremia    Subjective: Pt doing okay No dizziness Walked with PT Still has a cough  Medications:   sodium chloride   Intravenous Once   fluticasone furoate-vilanterol  1 puff Inhalation Daily   pantoprazole (PROTONIX) IV  40 mg Intravenous Q24H   rosuvastatin  10 mg Oral Daily    Objective: Vital signs in last 24 hours: Temp:  [97.8 F (36.6 C)-98.4 F (36.9 C)] 97.8 F (36.6 C) (08/22 0750) Pulse Rate:  [70-75] 75 (08/22 0750) Resp:  [16-20] 18 (08/22 0750) BP: (90-111)/(45-58) 104/58 (08/22 0750) SpO2:  [94 %-97 %] 95 % (08/22 0750)   PHYSICAL EXAM:  General: Alert, cooperative, no distress, appears stated age.  Head: Normocephalic, without obvious abnormality, atraumatic. Eyes: Conjunctivae clear, anicteric sclerae. Pupils are equal ENT Nares normal. No drainage or sinus tenderness. Lips, mucosa, and tongue normal. No Thrush Neck: Supple, symmetrical, no adenopathy, thyroid: non tender no carotid bruit and no JVD. Back: No CVA tenderness. Lungs: b/l air entry Few basal crepts Heart: Regular rate and rhythm, no murmur, rub or gallop. Abdomen: Soft, non-tender,not distended. Bowel sounds normal. No masses. Left nephrostomy- clean urine Extremities: atraumatic, no cyanosis. No edema. No clubbing Skin: No rashes or lesions. Or bruising Lymph: Cervical,  supraclavicular normal. Neurologic: Grossly non-focal  Lab Results Recent Labs    02/25/22 0414 02/26/22 0508  WBC 5.1 4.6  HGB 8.3* 8.5*  HCT 26.7* 27.6*  NA 139 140  K 4.1 3.9  CL 110 110  CO2 24 25  BUN 18 15  CREATININE 0.84 0.74   Liver Panel Recent Labs    02/23/22 2327  PROT 6.7  ALBUMIN 3.2*  AST 22  ALT 11  ALKPHOS 41  BILITOT 0.7   Sedimentation Rate No results for input(s): "ESRSEDRATE" in the last 72 hours. C-Reactive Protein No results for input(s): "CRP" in the last 72 hours.  Microbiology:  Studies/Results: ECHOCARDIOGRAM COMPLETE  Result Date: 02/25/2022    ECHOCARDIOGRAM REPORT   Patient Name:   Courtney Mack Date of Exam: 02/25/2022 Medical Rec #:  [id number]          Height:       59.0 in Accession #:    [id number]         Weight:       175.0 lb Date of Birth:  03-07-1956          BSA:          1.743 m Patient Age:    30 years           BP:           113/61 mmHg Patient Gender: F                  HR:           73 bpm. Exam Location:  [facility] Procedure:  2D Echo, Cardiac Doppler and Color Doppler Indications:     R78.81 Bacteremia  History:         Patient has prior history of Echocardiogram examinations, most                  recent 09/12/2021. CHF; Risk Factors:Diabetes, Dyslipidemia and                  Hypertension.  Sonographer:     Rosalia Hammers Referring Phys:  [id number] Karie Kirks Diagnosing Phys: Yolonda Kida MD  Sonographer Comments: Image acquisition challenging due to patient body habitus. IMPRESSIONS  1. Left ventricular ejection fraction, by estimation, is 65 to 70%. The left ventricle has normal function. The left ventricle has no regional wall motion abnormalities. Left ventricular diastolic parameters are consistent with Grade I diastolic dysfunction (impaired relaxation).  2. Right ventricular systolic function is normal. The right ventricular size is normal.  3. The mitral valve is normal in structure. No evidence of mitral valve regurgitation.   4. The aortic valve is normal in structure. Aortic valve regurgitation is not visualized. FINDINGS  Left Ventricle: Left ventricular ejection fraction, by estimation, is 65 to 70%. The left ventricle has normal function. The left ventricle has no regional wall motion abnormalities. The left ventricular internal cavity size was normal in size. There is  no left ventricular hypertrophy. Left ventricular diastolic parameters are consistent with Grade I diastolic dysfunction (impaired relaxation). Right Ventricle: The right ventricular size is normal. No increase in right ventricular wall thickness. Right ventricular systolic function is normal. Left Atrium: Left atrial size was normal in size. Right Atrium: Right atrial size was normal in size. Pericardium: There is no evidence of pericardial effusion. Mitral Valve: The mitral valve is normal in structure. No evidence of mitral valve regurgitation. Tricuspid Valve: The tricuspid valve is normal in structure. Tricuspid valve regurgitation is mild. Aortic Valve: The aortic valve is normal in structure. Aortic valve regurgitation is not visualized. Aortic valve mean gradient measures 7.0 mmHg. Aortic valve peak gradient measures 11.8 mmHg. Aortic valve area, by VTI measures 1.99 cm. Pulmonic Valve: The pulmonic valve was normal in structure. Pulmonic valve regurgitation is not visualized. Aorta: The ascending aorta was not well visualized. IAS/Shunts: No atrial level shunt detected by color flow Doppler.  LEFT VENTRICLE PLAX 2D LVIDd:         3.09 cm   Diastology LVIDs:         2.01 cm   LV e' medial:    7.29 cm/s LV PW:         1.34 cm   LV E/e' medial:  13.9 LV IVS:        0.95 cm   LV e' lateral:   8.27 cm/s LVOT diam:     1.80 cm   LV E/e' lateral: 12.2 LV SV:         74 LV SV Index:   43 LVOT Area:     2.54 cm  RIGHT VENTRICLE RV Basal diam:  2.93 cm RV S prime:     16.50 cm/s TAPSE (M-mode): 2.4 cm LEFT ATRIUM             Index        RIGHT ATRIUM           Index  LA diam:        3.10 cm 1.78 cm/m   RA Area:     12.40 cm LA Vol (A2C):  45.2 ml 25.94 ml/m  RA Volume:   23.70 ml  13.60 ml/m LA Vol (A4C):   29.3 ml 16.81 ml/m LA Biplane Vol: 38.5 ml 22.09 ml/m  AORTIC VALVE AV Area (Vmax):    2.00 cm AV Area (Vmean):   1.78 cm AV Area (VTI):     1.99 cm AV Vmax:           172.00 cm/s AV Vmean:          127.000 cm/s AV VTI:            0.373 m AV Peak Grad:      11.8 mmHg AV Mean Grad:      7.0 mmHg LVOT Vmax:         135.00 cm/s LVOT Vmean:        88.800 cm/s LVOT VTI:          0.292 m LVOT/AV VTI ratio: 0.78  AORTA Ao Root diam: 3.60 cm MITRAL VALVE MV Area (PHT): 3.58 cm     SHUNTS MV Decel Time: 212 msec     Systemic VTI:  0.29 m MV E velocity: 101.00 cm/s  Systemic Diam: 1.80 cm MV A velocity: 127.00 cm/s MV E/A ratio:  0.80 Dwayne D Callwood MD Electronically signed by Yolonda Kida MD Signature Date/Time: 02/25/2022/5:27:16 PM    Final    IR NEPHROSTOMY EXCHANGE LEFT  Result Date: 02/25/2022 INDICATION: 66 year old woman with left obstructive uropathy presents to IR for routine exchange of nephrostomy drain. EXAM: Fluoroscopy guided left nephrostomy drain exchange COMPARISON:  None Available. MEDICATIONS: None ANESTHESIA/SEDATION: None CONTRAST:  4 mL of Omnipaque 350-administered into the collecting system(s) FLUOROSCOPY TIME:  Radiation Exposure Index (as provided by the fluoroscopic device): 4.2 mGy Kerma COMPLICATIONS: None immediate. PROCEDURE: Informed written consent was obtained from the patient after a thorough discussion of the procedural risks, benefits and alternatives. All questions were addressed. Maximal Sterile Barrier Technique was utilized including caps, mask, sterile gowns, sterile gloves, sterile drape, hand hygiene and skin antiseptic. A timeout was performed prior to the initiation of the procedure. Patient position prone on the procedure table. The external segment of the existing 10.2 [place] multipurpose pigtail drain and  surrounding skin prepped and draped usual fashion. Contrast administered through the drain under fluoroscopy confirmed appropriate positioning within the renal pelvis. The drain was cut and removed over 0.035 inch guidewire and replaced with an identical 10.2 [place] multipurpose pigtail drain. Contrast administered through the new drain under fluoroscopy showed appropriate positioning within the renal pelvis. Drain secured to skin with suture and connected to bag. IMPRESSION: Routine exchange of left percutaneous for ostomy drain with new 10.2 French catheter in place. PLAN: Return in 8 weeks for routine exchange. Electronically Signed   By: Miachel Roux M.D.   On: 02/25/2022 15:10     Assessment/Plan: ? ?Symptomatic anemia- HB 6.8-received PRBC H/o GAVE -    Staph epidermidis - in blood culture- patient says it was taken from the peripheral line- likely a contaminant- repeat culture neg Urine culture from nephrostomy no staph epidermidis( spoke to lab and they did maldi) Can DC vanco   Left hydronephrosis due to ureteral stricture has resolved She has Left Percutaneous nephrostomy which was changed this admission   H/o endometrial carcinoma s/p radiation   Radiation cystitis   H/o DVT/PE_ has IVC filter and on xarelto Discussed the management with the patient   ID will sign off =- call if needed

## 2022-02-26 NOTE — Plan of Care (Signed)
  Problem: Education: Goal: Knowledge of General Education information will improve Description: Including pain rating scale, medication(s)/side effects and non-pharmacologic comfort measures Outcome: Progressing   Problem: Clinical Measurements: Goal: Cardiovascular complication will be avoided Outcome: Progressing   Problem: Nutrition: Goal: Adequate nutrition will be maintained Outcome: Progressing   Problem: Coping: Goal: Level of anxiety will decrease Outcome: Progressing   Problem: Elimination: Goal: Will not experience complications related to bowel motility Outcome: Progressing Goal: Will not experience complications related to urinary retention Outcome: Progressing   Problem: Pain Managment: Goal: General experience of comfort will improve Outcome: Progressing   Problem: Safety: Goal: Ability to remain free from injury will improve Outcome: Progressing   Problem: Skin Integrity: Goal: Risk for impaired skin integrity will decrease Outcome: Progressing

## 2022-02-26 NOTE — Progress Notes (Signed)
PROGRESS NOTE    Courtney Mack  WEX:[id number] DOB: 11-25-1955 DOA: 02/23/2022 PCP: [facility]    Brief Narrative:  66 yr old woman who presented to [facility] ED on 02/23/2022 with complaints of lightheadedness, headache, shortness of breath and worsening of her chronic lower extremity edema. She was found to have a low blood pressure with systolics in the 93-81 range. Her hemoglobin was 6.8, potassium was 3.3, and she was guaiac positive.    Lower extremity doppler demonstrated acute on chronic left common femoral vein DVT and a chronic partially occlusive thrombus right common femoral vein and proximal superficial femoral vein.   CTA chest and abdomen demonstrated no acute pulmonary embolus. There was stable minimal chronic pulmonary embolus in right lower lobar pulmonary artery. There was evidence for extensive bronchitis and airway impaction in the lower lobes bilaterally with endobronchial debris suggesting superimposed infection or aspiration. There is also evidence for small airway disease. There is a left percutaneous nephrostomy catheter in expected position. There is no hydronephrosis. There was suggestion of cystitis with marked circumferential stranding. 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 This may be related to progressive subcutaneous body wall edema. There is also peripheral vascular disease with hemodynamically significantly stenosis of the celiac axis and superior mesenteric artery proximally. Suggestion is to correlate clinically for signs and symptoms of chronic mesenteric ischemia. Suggestion is also for CT arteriography.   Patient has been started on heparin drip for chronic DVT.  Gastroenterology without plans for endoscopic evaluation.  Blood cultures have now grown out 2/2 positive for Staphylococcus epidermidis.  Patient has been on IV  antibiotics.  Infectious disease on board.  Successful nephrostomy tube exchange on 8/21   Assessment & Plan:   Active Problems:   Symptomatic anemia due to chronic blood loss   Hypotension   Gastric antral vascular ectasia   Diabetes mellitus without complication (HCC)   Chronic diastolic CHF (congestive heart failure) (HCC)   Endometrial cancer (HCC)   Acute on chronic right femoral DVT (deep venous thrombosis) (HCC)   Bilateral hydronephrosis s/p left nephrostomy tube June 2023   Chronic radiation cystitis   History of pulmonary embolus (PE)   Lymphedema   Symptomatic anemia   Acute bronchitis   Staphylococcus epidermidis bacteremia  Symptomatic anemia due to chronic blood loss History of upper GI bleed 7/25/EGD with GAVE s/p APC History of radiation cystitis with intermittent hematuria Chronic anticoagulation Etiology suspect GI given positive stool guaiac in the ED, lower suspicion for GU etiology given clear urine in nephrostomy bag Has received 2 unit transfusion so far Plan: Continue holding Xarelto Continue intravenous PPI Transfusion threshold 8.0 Continue heparin   Hypotension Secondary to hypovolemia from ongoing blood loss, diuretic treatment Hold antihypertensives   Diabetes mellitus without complication (HCC) Sliding scale insulin coverage   Chronic diastolic CHF (congestive heart failure) (HCC) Compensated Hold Lasix due to hypotension   Endometrial cancer [facility]) Oncology follow up   Staphylococcus epidermidis bacteremia 2/2 culture have grown out staph epi. Infectious disease has been consulted. Dr. Delaine Lame recommends continuation of Vancomycin for now. Urine from left urostomy will be cultured.  No growth to date  Acute bronchitis Appears resolved   Symptomatic anemia The patient received 2 unit of PRBC's in transfusion. GI has been consulted as the patient has had multiple recent GIB issues. PPI and supportive care recommended.  No plans  for  endoscopic evaluation   Lymphedema Holding Lasix due to soft blood pressure   History of pulmonary embolus (PE) Heparin drip started to be managed by pharmacy.   Bilateral hydronephrosis s/p left nephrostomy tube June 2023 Chronic radiation cystitis with intermittent hematuria s/p fulguration June 2023 Possible UTI UA with moderate leukocyte esterases but normal WBC and lactic acid and procalcitonin Patient received cefepime and Flagyl in the ED Successful urostomy tube exchange on 8/21 Follow cultures from nephrostomy   Acute on chronic right femoral DVT (deep venous thrombosis) (HCC) Chronic PE History of IVC filter Heparin drip started.   DVT prophylaxis: Heparin GTT Code Status: Full Family Communication: Left VM for daughter Jossie Ng [phone] on 8/22 Disposition Plan: Status is: Inpatient Remains inpatient appropriate because: Gram-positive bacteremia on IV antibiotics   Level of care: Med-Surg  Consultants:  Infectious disease  Procedures:  None  Antimicrobials: Vancomycin   Subjective: Seen and examined.  Appears pale.  Resting in bed.  No pain complaints.  Objective: Vitals:   02/25/22 1610 02/25/22 2000 02/26/22 0339 02/26/22 0750  BP: (!) 111/51 (!) 101/48 (!) 90/45 (!) 104/58  Pulse: 73 74 70 75  Resp: '16 20 20 18  '$ Temp: 98.2 F (36.8 C) 98.4 F (36.9 C) 98.1 F (36.7 C) 97.8 F (36.6 C)  TempSrc: Oral Oral Oral   SpO2: 97% 95% 94% 95%  Weight:      Height:        Intake/Output Summary (Last 24 hours) at 02/26/2022 1536 Last data filed at 02/26/2022 1300 Gross per 24 hour  Intake 774.76 ml  Output 3000 ml  Net -2225.24 ml   Filed Weights   02/23/22 2310  Weight: 79.4 kg    Examination:  General exam: No acute distress.  Appears pale Respiratory system: Clear to auscultation. Respiratory effort normal. Cardiovascular system: S1-S2, RRR, no murmurs, no pedal edema Gastrointestinal system: \Soft, NT/ND, normal bowel  sounds Central nervous system: Alert and oriented. No focal neurological deficits. Extremities: Symmetric 5 x 5 power. Skin: No rashes, lesions or ulcers Psychiatry: Judgement and insight appear normal. Mood & affect appropriate.     Data Reviewed: I have personally reviewed following labs and imaging studies  CBC: Recent Labs  Lab 02/23/22 2327 02/24/22 0907 02/25/22 0414 02/26/22 0508  WBC 5.3 4.6 5.1 4.6  NEUTROABS 3.5  --  3.5  --   HGB 6.8* 7.2* 8.3* 8.5*  HCT 23.1* 23.6* 26.7* 27.6*  MCV 85.9 84.0 84.2 84.7  PLT 288 240 225 786   Basic Metabolic Panel: Recent Labs  Lab 02/23/22 2327 02/24/22 0201 02/25/22 0414 02/26/22 0508  NA 139 140 139 140  K 3.3* 3.2* 4.1 3.9  CL 105 109 110 110  CO2 '27 23 24 25  '$ GLUCOSE 152* 103* 95 96  BUN '18 17 18 15  '$ CREATININE 0.89 0.82 0.84 0.74  CALCIUM 8.6* 7.9* 8.2* 8.4*   GFR: Estimated Creatinine Clearance: 62.2 mL/min (by C-G formula based on SCr of 0.74 mg/dL). Liver Function Tests: Recent Labs  Lab 02/23/22 2327  AST 22  ALT 11  ALKPHOS 41  BILITOT 0.7  PROT 6.7  ALBUMIN 3.2*   No results for input(s): "LIPASE", "AMYLASE" in the last 168 hours. No results for input(s): "AMMONIA" in the last 168 hours. Coagulation Profile: Recent Labs  Lab 02/25/22 1738  INR 1.1   Cardiac Enzymes: No results for input(s): "CKTOTAL", "CKMB", "CKMBINDEX", "TROPONINI" in the last 168 hours. BNP (last 3 results) No results for input(s): "PROBNP"  in the last 8760 hours. HbA1C: No results for input(s): "HGBA1C" in the last 72 hours. CBG: No results for input(s): "GLUCAP" in the last 168 hours. Lipid Profile: No results for input(s): "CHOL", "HDL", "LDLCALC", "TRIG", "CHOLHDL", "LDLDIRECT" in the last 72 hours. Thyroid Function Tests: No results for input(s): "TSH", "T4TOTAL", "FREET4", "T3FREE", "THYROIDAB" in the last 72 hours. Anemia Panel: No results for input(s): "VITAMINB12", "FOLATE", "FERRITIN", "TIBC", "IRON",  "RETICCTPCT" in the last 72 hours. Sepsis Labs: Recent Labs  Lab 02/23/22 2358 02/24/22 0234  PROCALCITON <0.10  --   LATICACIDVEN  --  0.7    Recent Results (from the past 240 hour(s))  SARS Coronavirus 2 by RT PCR (hospital order, performed in [facility] hospital lab) *cepheid single result test* Anterior Nasal Swab     Status: None   Collection Time: 02/24/22 12:38 AM   Specimen: Anterior Nasal Swab  Result Value Ref Range Status   SARS Coronavirus 2 by RT PCR NEGATIVE NEGATIVE Final    Comment: (NOTE) SARS-CoV-2 target nucleic acids are NOT DETECTED.  The SARS-CoV-2 RNA is generally detectable in upper and lower respiratory specimens during the acute phase of infection. The lowest concentration of SARS-CoV-2 viral copies this assay can detect is 250 copies / mL. A negative result does not preclude SARS-CoV-2 infection and should not be used as the sole basis for treatment or other patient management decisions.  A negative result may occur with improper specimen collection / handling, submission of specimen other than nasopharyngeal swab, presence of viral mutation(s) within the areas targeted by this assay, and inadequate number of viral copies (<250 copies / mL). A negative result must be combined with clinical observations, patient history, and epidemiological information.  Fact Sheet for Patients:   https://www.patel.info/  Fact Sheet for Healthcare Providers: https://hall.com/  This test is not yet approved or  cleared by the [place] FDA and has been authorized for detection and/or diagnosis of SARS-CoV-2 by FDA under an Emergency Use Authorization (EUA).  This EUA will remain in effect (meaning this test can be used) for the duration of the COVID-19 declaration under Section 564(b)(1) of the Act, 21 U.S.C. section 360bbb-3(b)(1), unless the authorization is terminated or revoked sooner.  Performed at [facility], [address], [place], [place] [address]   Urine Culture     Status: Abnormal   Collection Time: 02/24/22  2:01 AM   Specimen: Urine, Clean Catch  Result Value Ref Range Status   Specimen Description   Final    URINE, CLEAN CATCH Performed at [facility], [address], [place], [place] [address]    Special Requests   Final    NONE Performed at [facility], [address] [place], [place] [address]    Culture MULTIPLE SPECIES PRESENT, SUGGEST RECOLLECTION (A)  Final   Report Status 02/25/2022 FINAL  Final  Blood culture (routine x 2)     Status: Abnormal (Preliminary result)   Collection Time: 02/24/22  2:35 AM   Specimen: BLOOD  Result Value Ref Range Status   Specimen Description   Final    BLOOD LEFT FA Performed at [facility], [address] [place], [place] [address]    Special Requests   Final    BOTTLES DRAWN AEROBIC AND ANAEROBIC Blood Culture results may not be optimal due to an inadequate volume of blood received in culture bottles Performed at [facility], [address] [place], [place] [address]    Culture  Setup Time   Final    GRAM POSITIVE COCCI IN BOTH AEROBIC AND ANAEROBIC BOTTLES CRITICAL RESULT CALLED TO, READ BACK BY AND VERIFIED WITH: JASON ROBBINS 02/25/22 0051 DE    Culture STAPHYLOCOCCUS EPIDERMIDIS (A)  Final   Report Status PENDING  Incomplete  Blood culture (routine x 2)     Status: Abnormal (Preliminary result)   Collection Time: 02/24/22  2:35 AM   Specimen: BLOOD  Result Value Ref Range Status   Specimen Description   Final    BLOOD LEFT UPPER ARM Performed at [facility], [address] [place], [place] [address]    Special Requests   Final    BOTTLES DRAWN AEROBIC AND ANAEROBIC Blood Culture adequate volume Performed at [facility], [address] [place], [place] [address]    Culture  Setup Time   Final    Organism ID to follow [place] CRITICAL RESULT CALLED TO, READ BACK BY AND VERIFIED WITH: JASON ROBBINS 02/24/22 2156 DE    Culture (A)  Final    STAPHYLOCOCCUS HOMINIS THE SIGNIFICANCE OF ISOLATING THIS ORGANISM FROM A SINGLE SET OF BLOOD CULTURES WHEN MULTIPLE SETS ARE DRAWN IS UNCERTAIN. PLEASE NOTIFY THE MICROBIOLOGY DEPARTMENT WITHIN ONE WEEK IF SPECIATION AND SENSITIVITIES ARE REQUIRED. STAPHYLOCOCCUS EPIDERMIDIS CULTURE REINCUBATED FOR BETTER GROWTH Performed at [facility], [place] [address], [place], [place] [address]    Report Status PENDING  Incomplete  Blood Culture ID Panel (Reflexed)     Status: Abnormal   Collection Time: 02/24/22  2:35 AM  Result Value Ref Range Status   Enterococcus faecalis NOT DETECTED NOT DETECTED Final   Enterococcus Faecium NOT DETECTED NOT DETECTED Final   Listeria monocytogenes NOT DETECTED NOT DETECTED Final   Staphylococcus species DETECTED (A) NOT DETECTED Final    Comment: CRITICAL RESULT CALLED TO, READ BACK BY AND VERIFIED WITH: JASON ROBBINS PHARMD AT 2156 02/24/2022 DE    Staphylococcus aureus (BCID) NOT DETECTED NOT DETECTED Final   Staphylococcus epidermidis DETECTED (A) NOT DETECTED Final    Comment: Methicillin (oxacillin) resistant coagulase negative staphylococcus. Possible blood culture contaminant (unless isolated from more than one blood culture draw or clinical case suggests pathogenicity). No antibiotic treatment is indicated for blood  culture contaminants. CRITICAL RESULT CALLED TO, READ BACK BY AND VERIFIED WITH: Violeta Gelinas PHARMD AT 2156 02/24/2022 DE    Staphylococcus lugdunensis NOT DETECTED NOT DETECTED Final   Streptococcus species NOT DETECTED NOT DETECTED Final   Streptococcus agalactiae NOT DETECTED NOT DETECTED Final   Streptococcus pneumoniae NOT DETECTED NOT DETECTED Final   Streptococcus pyogenes NOT DETECTED NOT DETECTED Final   A.calcoaceticus-baumannii NOT DETECTED NOT DETECTED Final   Bacteroides  fragilis NOT DETECTED NOT DETECTED Final   Enterobacterales NOT DETECTED NOT DETECTED Final   Enterobacter cloacae complex NOT DETECTED NOT DETECTED Final   Escherichia coli NOT DETECTED NOT DETECTED Final   Klebsiella aerogenes NOT DETECTED NOT DETECTED Final   Klebsiella oxytoca NOT DETECTED NOT DETECTED Final   Klebsiella pneumoniae NOT DETECTED NOT DETECTED Final   Proteus species NOT DETECTED NOT DETECTED Final   Salmonella species NOT DETECTED NOT DETECTED Final   Serratia marcescens NOT DETECTED NOT DETECTED Final   Haemophilus influenzae NOT DETECTED NOT DETECTED Final   Neisseria meningitidis NOT DETECTED NOT DETECTED Final   Pseudomonas aeruginosa NOT DETECTED NOT DETECTED Final   Stenotrophomonas maltophilia NOT DETECTED NOT DETECTED Final   Candida albicans NOT DETECTED NOT DETECTED Final  Candida auris NOT DETECTED NOT DETECTED Final   Candida glabrata NOT DETECTED NOT DETECTED Final   Candida krusei NOT DETECTED NOT DETECTED Final   Candida parapsilosis NOT DETECTED NOT DETECTED Final   Candida tropicalis NOT DETECTED NOT DETECTED Final   Cryptococcus neoformans/gattii NOT DETECTED NOT DETECTED Final   Methicillin resistance mecA/C DETECTED (A) NOT DETECTED Final    Comment: CRITICAL RESULT CALLED TO, READ BACK BY AND VERIFIED WITHVioleta Gelinas PHARMD AT 2156 02/24/2022 DE Performed at [facility], [address], [place], [place] [address]   Urine Culture     Status: Abnormal   Collection Time: 02/25/22  3:05 PM   Specimen: Urine, Random  Result Value Ref Range Status   Specimen Description   Final    URINE, RANDOM Performed at [facility], [address] [place], [place] [address]    Special Requests   Final    NONE Performed at [facility], [address] [place], [place] [address]    Culture MULTIPLE SPECIES PRESENT, SUGGEST RECOLLECTION (A)  Final   Report Status 02/26/2022 FINAL  Final  Respiratory (~20 pathogens) panel  by PCR     Status: None   Collection Time: 02/25/22  5:10 PM   Specimen: Nasopharyngeal Swab; Respiratory  Result Value Ref Range Status   Adenovirus NOT DETECTED NOT DETECTED Final   Coronavirus 229E NOT DETECTED NOT DETECTED Final    Comment: (NOTE) The Coronavirus on the Respiratory Panel, DOES NOT test for the novel  Coronavirus (2019 nCoV)    Coronavirus HKU1 NOT DETECTED NOT DETECTED Final   Coronavirus NL63 NOT DETECTED NOT DETECTED Final   Coronavirus OC43 NOT DETECTED NOT DETECTED Final   Metapneumovirus NOT DETECTED NOT DETECTED Final   Rhinovirus / Enterovirus NOT DETECTED NOT DETECTED Final   Influenza A NOT DETECTED NOT DETECTED Final   Influenza B NOT DETECTED NOT DETECTED Final   Parainfluenza Virus 1 NOT DETECTED NOT DETECTED Final   Parainfluenza Virus 2 NOT DETECTED NOT DETECTED Final   Parainfluenza Virus 3 NOT DETECTED NOT DETECTED Final   Parainfluenza Virus 4 NOT DETECTED NOT DETECTED Final   Respiratory Syncytial Virus NOT DETECTED NOT DETECTED Final   Bordetella pertussis NOT DETECTED NOT DETECTED Final   Bordetella Parapertussis NOT DETECTED NOT DETECTED Final   Chlamydophila pneumoniae NOT DETECTED NOT DETECTED Final   Mycoplasma pneumoniae NOT DETECTED NOT DETECTED Final    Comment: Performed at [facility], [place] [place], [place] [address]         Radiology Studies: ECHOCARDIOGRAM COMPLETE  Result Date: 02/25/2022    ECHOCARDIOGRAM REPORT   Patient Name:   PRISEIS CRATTY Date of Exam: 02/25/2022 Medical Rec #:  [id number]          Height:       59.0 in Accession #:    [id number]         Weight:       175.0 lb Date of Birth:  May 31, 1956          BSA:          1.743 m Patient Age:    89 years           BP:           113/61 mmHg Patient Gender: F                  HR:           73 bpm.  Exam Location:  [facility] Procedure: 2D Echo, Cardiac Doppler and Color Doppler Indications:     R78.81 Bacteremia  History:         Patient has prior history  of Echocardiogram examinations, most                  recent 09/12/2021. CHF; Risk Factors:Diabetes, Dyslipidemia and                  Hypertension.  Sonographer:     Rosalia Hammers Referring Phys:  [id number] Karie Kirks Diagnosing Phys: Yolonda Kida MD  Sonographer Comments: Image acquisition challenging due to patient body habitus. IMPRESSIONS  1. Left ventricular ejection fraction, by estimation, is 65 to 70%. The left ventricle has normal function. The left ventricle has no regional wall motion abnormalities. Left ventricular diastolic parameters are consistent with Grade I diastolic dysfunction (impaired relaxation).  2. Right ventricular systolic function is normal. The right ventricular size is normal.  3. The mitral valve is normal in structure. No evidence of mitral valve regurgitation.  4. The aortic valve is normal in structure. Aortic valve regurgitation is not visualized. FINDINGS  Left Ventricle: Left ventricular ejection fraction, by estimation, is 65 to 70%. The left ventricle has normal function. The left ventricle has no regional wall motion abnormalities. The left ventricular internal cavity size was normal in size. There is  no left ventricular hypertrophy. Left ventricular diastolic parameters are consistent with Grade I diastolic dysfunction (impaired relaxation). Right Ventricle: The right ventricular size is normal. No increase in right ventricular wall thickness. Right ventricular systolic function is normal. Left Atrium: Left atrial size was normal in size. Right Atrium: Right atrial size was normal in size. Pericardium: There is no evidence of pericardial effusion. Mitral Valve: The mitral valve is normal in structure. No evidence of mitral valve regurgitation. Tricuspid Valve: The tricuspid valve is normal in structure. Tricuspid valve regurgitation is mild. Aortic Valve: The aortic valve is normal in structure. Aortic valve regurgitation is not visualized. Aortic valve mean gradient measures  7.0 mmHg. Aortic valve peak gradient measures 11.8 mmHg. Aortic valve area, by VTI measures 1.99 cm. Pulmonic Valve: The pulmonic valve was normal in structure. Pulmonic valve regurgitation is not visualized. Aorta: The ascending aorta was not well visualized. IAS/Shunts: No atrial level shunt detected by color flow Doppler.  LEFT VENTRICLE PLAX 2D LVIDd:         3.09 cm   Diastology LVIDs:         2.01 cm   LV e' medial:    7.29 cm/s LV PW:         1.34 cm   LV E/e' medial:  13.9 LV IVS:        0.95 cm   LV e' lateral:   8.27 cm/s LVOT diam:     1.80 cm   LV E/e' lateral: 12.2 LV SV:         74 LV SV Index:   43 LVOT Area:     2.54 cm  RIGHT VENTRICLE RV Basal diam:  2.93 cm RV S prime:     16.50 cm/s TAPSE (M-mode): 2.4 cm LEFT ATRIUM             Index        RIGHT ATRIUM           Index LA diam:        3.10 cm 1.78 cm/m   RA Area:     12.40 cm  LA Vol (A2C):   45.2 ml 25.94 ml/m  RA Volume:   23.70 ml  13.60 ml/m LA Vol (A4C):   29.3 ml 16.81 ml/m LA Biplane Vol: 38.5 ml 22.09 ml/m  AORTIC VALVE AV Area (Vmax):    2.00 cm AV Area (Vmean):   1.78 cm AV Area (VTI):     1.99 cm AV Vmax:           172.00 cm/s AV Vmean:          127.000 cm/s AV VTI:            0.373 m AV Peak Grad:      11.8 mmHg AV Mean Grad:      7.0 mmHg LVOT Vmax:         135.00 cm/s LVOT Vmean:        88.800 cm/s LVOT VTI:          0.292 m LVOT/AV VTI ratio: 0.78  AORTA Ao Root diam: 3.60 cm MITRAL VALVE MV Area (PHT): 3.58 cm     SHUNTS MV Decel Time: 212 msec     Systemic VTI:  0.29 m MV E velocity: 101.00 cm/s  Systemic Diam: 1.80 cm MV A velocity: 127.00 cm/s MV E/A ratio:  0.80 Dwayne D Callwood MD Electronically signed by Yolonda Kida MD Signature Date/Time: 02/25/2022/5:27:16 PM    Final    IR NEPHROSTOMY EXCHANGE LEFT  Result Date: 02/25/2022 INDICATION: 66 year old woman with left obstructive uropathy presents to IR for routine exchange of nephrostomy drain. EXAM: Fluoroscopy guided left nephrostomy drain exchange  COMPARISON:  None Available. MEDICATIONS: None ANESTHESIA/SEDATION: None CONTRAST:  4 mL of Omnipaque 350-administered into the collecting system(s) FLUOROSCOPY TIME:  Radiation Exposure Index (as provided by the fluoroscopic device): 4.2 mGy Kerma COMPLICATIONS: None immediate. PROCEDURE: Informed written consent was obtained from the patient after a thorough discussion of the procedural risks, benefits and alternatives. All questions were addressed. Maximal Sterile Barrier Technique was utilized including caps, mask, sterile gowns, sterile gloves, sterile drape, hand hygiene and skin antiseptic. A timeout was performed prior to the initiation of the procedure. Patient position prone on the procedure table. The external segment of the existing 10.2 [place] multipurpose pigtail drain and surrounding skin prepped and draped usual fashion. Contrast administered through the drain under fluoroscopy confirmed appropriate positioning within the renal pelvis. The drain was cut and removed over 0.035 inch guidewire and replaced with an identical 10.2 [place] multipurpose pigtail drain. Contrast administered through the new drain under fluoroscopy showed appropriate positioning within the renal pelvis. Drain secured to skin with suture and connected to bag. IMPRESSION: Routine exchange of left percutaneous for ostomy drain with new 10.2 French catheter in place. PLAN: Return in 8 weeks for routine exchange. Electronically Signed   By: Miachel Roux M.D.   On: 02/25/2022 15:10        Scheduled Meds:  sodium chloride   Intravenous Once   fluticasone furoate-vilanterol  1 puff Inhalation Daily   pantoprazole (PROTONIX) IV  40 mg Intravenous Q24H   rosuvastatin  10 mg Oral Daily   Continuous Infusions:  sodium chloride 10 mL/hr at 02/25/22 0349   heparin 1,150 Units/hr (02/26/22 1346)   vancomycin 750 mg (02/26/22 0122)     LOS: 2 days      Sidney Ace, MD [facility]   If 7PM-7AM, please  contact night-coverage  02/26/2022, 3:36 PM

## 2022-02-26 NOTE — Progress Notes (Signed)
Mobility Specialist - Progress Note    02/26/22 1507  Mobility  Activity Ambulated with assistance in hallway  Level of Assistance Minimal assist, patient does 75% or more  Assistive Device Front wheel walker  Distance Ambulated (ft) 40 ft  Activity Response Tolerated well  $Mobility charge 1 Mobility   Pt supine upon entry, utilizing RA. Pt transferred to EOB and STS MinA. Pt ambulated down the hallway and back using RW CGA. During ambulation pt upper body in slight flexed position, correction upon request. Pt left supine with needs in reach. No complaints.   Candie Mile Mobility Specialist 02/26/22 3:10 PM

## 2022-02-26 NOTE — Consult Note (Signed)
ANTICOAGULATION CONSULT NOTE - Consult  Pharmacy Consult for Heparin gtt Indication:  Acute on chronic DVT with low hemoglobin on admission  No Known Allergies  Patient Measurements: Height: '4\' 11"'$  (149.9 cm) Weight: 79.4 kg (175 lb) IBW/kg (Calculated) : 43.2 Heparin Dosing Weight: 61.6kg  Vital Signs: Temp: 97.8 F (36.6 C) (08/22 0750) Temp Source: Oral (08/22 0339) BP: 104/58 (08/22 0750) Pulse Rate: 75 (08/22 0750)  Labs: Recent Labs    02/23/22 2358 02/24/22 0201 02/24/22 0907 02/25/22 0414 02/25/22 1738 02/26/22 0004 02/26/22 0508  HGB  --   --  7.2* 8.3*  --   --  8.5*  HCT  --   --  23.6* 26.7*  --   --  27.6*  PLT  --   --  240 225  --   --  219  APTT  --   --   --   --  33  --   --   LABPROT  --   --   --   --  14.5  --   --   INR  --   --   --   --  1.1  --   --   HEPARINUNFRC  --   --   --   --  <0.10* 0.14*  --   CREATININE  --  0.82  --  0.84  --   --  0.74  TROPONINIHS 4 5  --   --   --   --   --     Estimated Creatinine Clearance: 62.2 mL/min (by C-G formula based on SCr of 0.74 mg/dL).  Medications: Heparin Dosing Weight: 61.6kg PTA - Xarelto '10mg'$  QD (pt ran out approximately 2wks ago)  Assessment: Courtney Mack is a 66 y.o. female with PMH significant for DM, Chronic DVT, Endometrial CA, dCHF, Chronic blood loss anemia, GIB, gastric antral vascular ectasia, PE, lymphedema, IDA, HLD. Patient presented with headache, lightheadedness, SOB, and worsening of her chronic LE edema. Patient was taking Xarelto PTA, but last reported dose was >2 weeks ago due to patient running out of home supply. Pharmacy consulted for heparin gtt initiation and management.  8/21 LE [place] - acute on chronic left common femoral vein DVT and a chronic partially occlusive thrombus right common femoral vein and proximal superficial femoral vein.  Baseline Labs: aPTT 32, HL <0.10,  PT 14.5, INR 1.1. Hgb 8.3, Hct 26.7, Plt 225  Date Time aPTT/HL Rate/Comment 8/22  0004 HL  0.14 SUBtherapeutic x 1  8/22  0844 HL 0.42 therapeutic x1      Goal of Therapy:  Heparin level 0.3-0.7 units/ml aPTT 66-102 seconds Monitor platelets by anticoagulation protocol: Yes   Plan:  Continue heparin infusion to 1150 units/hr Recheck HL in 6 hr and daily while on heparin Monitor CBC daily while on heparin  Gretel Acre, PharmD PGY1 Pharmacy Resident 02/26/2022 8:20 AM

## 2022-02-26 NOTE — Progress Notes (Signed)
Pharmacy Antibiotic Note  Courtney Mack is a 66 y.o. female admitted on 02/23/2022 with bacteremia. PMH significant for recurrent DVT and PE (on Xarelto s/p IVC filter), HTN, endometrial cancer, lymphedema, left nephrostomy tube since 12/27/2021 secondary to left hydronephrosis with ureteral stricture. Presented with concern for GIB and worsening lower extremity edema. Then, blood cultures growing 3/4 bottles with BCID detecting Staphylococcus epidermidis (mecA+). 02/24/2022 CTAP suspicious for cystitis. WBC WNL, afebrile, Scr stable (at baseline). Pharmacy has been consulted for Vancomycin dosing.  Plan: Continue Vancomycin 750 mg IV Q24H. Goal AUC 400-550. Expected AUC: 439.1 Expected Css min: 10.9 SCr used: 0.8 (actual 0.74)  Weight used: IBW, Vd used: 0.5 (BMI 35) Monitor Scr daily while on Vancomycin. Follow up Ucx of L nephrostomy.    Height: '4\' 11"'$  (149.9 cm) Weight: 79.4 kg (175 lb) IBW/kg (Calculated) : 43.2  Temp (24hrs), Avg:98.1 F (36.7 C), Min:97.8 F (36.6 C), Max:98.4 F (36.9 C)  Recent Labs  Lab 02/23/22 2327 02/24/22 0201 02/24/22 0234 02/24/22 0907 02/25/22 0414 02/26/22 0508  WBC 5.3  --   --  4.6 5.1 4.6  CREATININE 0.89 0.82  --   --  0.84 0.74  LATICACIDVEN  --   --  0.7  --   --   --      Estimated Creatinine Clearance: 62.2 mL/min (by C-G formula based on SCr of 0.74 mg/dL).    No Known Allergies  Antimicrobials this admission: Vancomycin 8/21  >>   Dose adjustments this admission: N/A  Microbiology results: 02/24/2022 BCx: BCID revealed MRSE 3/4 bottles 02/25/2022 BCx: in process 02/25/2022 UCx: in process   Thank you for allowing pharmacy to be a part of this patient's care.  Gretel Acre, PharmD PGY1 Pharmacy Resident 02/26/2022 8:37 AM

## 2022-02-26 NOTE — Consult Note (Signed)
ANTICOAGULATION CONSULT NOTE - Consult  Pharmacy Consult for Heparin gtt Indication:  Acute on chronic DVT with low hemoglobin on admission  No Known Allergies  Patient Measurements: Height: '4\' 11"'$  (149.9 cm) Weight: 79.4 kg (175 lb) IBW/kg (Calculated) : 43.2 Heparin Dosing Weight: 61.6kg  Vital Signs: Temp: 98.4 F (36.9 C) (08/21 2000) Temp Source: Oral (08/21 2000) BP: 101/48 (08/21 2000) Pulse Rate: 74 (08/21 2000)  Labs: Recent Labs    02/23/22 2327 02/23/22 2358 02/24/22 0201 02/24/22 0907 02/25/22 0414 02/25/22 1738 02/26/22 0004  HGB 6.8*  --   --  7.2* 8.3*  --   --   HCT 23.1*  --   --  23.6* 26.7*  --   --   PLT 288  --   --  240 225  --   --   APTT  --   --   --   --   --  33  --   LABPROT  --   --   --   --   --  14.5  --   INR  --   --   --   --   --  1.1  --   HEPARINUNFRC  --   --   --   --   --  <0.10* 0.14*  CREATININE 0.89  --  0.82  --  0.84  --   --   TROPONINIHS  --  4 5  --   --   --   --     Estimated Creatinine Clearance: 59.2 mL/min (by C-G formula based on SCr of 0.84 mg/dL).  Medications: Heparin Dosing Weight: 61.6kg PTA - Xarelto '10mg'$  QD (pt ran out approximately 2wks ago)  Assessment: 66yo female w/ h/o DM, Chronic DVT, Endometrial CA, diaCHF, Chronic blood loss anemia, GIB, gastric antral vascular ectasia, PE, lymphedema, IDA, HLD, presenting with headache, lightheadedness, SOB, and worsening of her chronic LE edema. PTA pt taking xarelto '10mg'$  QD. Pharmacy consulted for heparin gtt mgmt.  8/21 LE [place] - acute on chronic left common femoral vein DVT and a chronic partially occlusive thrombus right common femoral vein and proximal superficial femoral vein.  Date Time aPTT/HL Rate/Comment 8/22  0004 HL 0.14 Subtherapeutic x 1     Baseline Labs: aPTT - pending INR - pending Hgb - 6.8>7.2>trf 1u > 8.3 > (started heparin) Plts - [phone]   Goal of Therapy:  Heparin level 0.3-0.7 units/ml aPTT 66-102 seconds Monitor platelets  by anticoagulation protocol: Yes   Plan:  Give 1800 units bolus x 1 Increase  heparin infusion to 1150 units/hr Recheck HL in 6 hr after rate change CBC daily while on heparin  Renda Rolls, PharmD, [facility] 02/26/2022 12:47 AM

## 2022-02-26 NOTE — Consult Note (Signed)
ANTICOAGULATION CONSULT NOTE - Consult  Pharmacy Consult for Heparin gtt Indication:  Acute on chronic DVT with low hemoglobin on admission  No Known Allergies  Patient Measurements: Height: '4\' 11"'$  (149.9 cm) Weight: 79.4 kg (175 lb) IBW/kg (Calculated) : 43.2 Heparin Dosing Weight: 61.6kg  Vital Signs: Temp: 97.8 F (36.6 C) (08/22 0750) BP: 104/58 (08/22 0750) Pulse Rate: 75 (08/22 0750)  Labs: Recent Labs    02/23/22 2358 02/24/22 0201 02/24/22 9937 02/25/22 0414 02/25/22 1738 02/25/22 1738 02/26/22 0004 02/26/22 0508 02/26/22 0844 02/26/22 1735  HGB  --   --  7.2* 8.3*  --   --   --  8.5*  --   --   HCT  --   --  23.6* 26.7*  --   --   --  27.6*  --   --   PLT  --   --  240 225  --   --   --  219  --   --   APTT  --   --   --   --  33  --   --   --   --   --   LABPROT  --   --   --   --  14.5  --   --   --   --   --   INR  --   --   --   --  1.1  --   --   --   --   --   HEPARINUNFRC  --   --   --   --  <0.10*   < > 0.14*  --  0.42 0.38  CREATININE  --  0.82  --  0.84  --   --   --  0.74  --   --   TROPONINIHS 4 5  --   --   --   --   --   --   --   --    < > = values in this interval not displayed.    Estimated Creatinine Clearance: 62.2 mL/min (by C-G formula based on SCr of 0.74 mg/dL).  Medications: Heparin Dosing Weight: 61.6kg PTA - Xarelto '10mg'$  QD (pt ran out approximately 2wks ago)  Assessment: Courtney Mack is a 66 y.o. female with PMH significant for DM, Chronic DVT, Endometrial CA, dCHF, Chronic blood loss anemia, GIB, gastric antral vascular ectasia, PE, lymphedema, IDA, HLD. Patient presented with headache, lightheadedness, SOB, and worsening of her chronic LE edema. Patient was taking Xarelto PTA, but last reported dose was >2 weeks ago due to patient running out of home supply. Pharmacy consulted for heparin gtt initiation and management.  8/21 LE [place] - acute on chronic left common femoral vein DVT and a chronic partially occlusive thrombus  right common femoral vein and proximal superficial femoral vein.  Baseline Labs: aPTT 32, HL <0.10,  PT 14.5, INR 1.1. Hgb 8.3, Hct 26.7, Plt 225  Date Time aPTT/HL Rate/Comment 8/22  0004 HL 0.14 SUBtherapeutic x 1  8/22  0844 HL 0.42 therapeutic x1  8/22 1735 HL 0.38 therax2     Goal of Therapy:  Heparin level 0.3-0.7 units/ml aPTT 66-102 seconds Monitor platelets by anticoagulation protocol: Yes   Plan:  8/22 1735 HL 0.38 therax2 Continue heparin infusion at 1150 units/hr F/u  HL with am labs Monitor CBC daily while on heparin  Chinita Greenland PharmD Clinical Pharmacist 02/26/2022

## 2022-02-27 DIAGNOSIS — K31819 Angiodysplasia of stomach and duodenum without bleeding: Secondary | ICD-10-CM | POA: Diagnosis not present

## 2022-02-27 DIAGNOSIS — I82412 Acute embolism and thrombosis of left femoral vein: Secondary | ICD-10-CM | POA: Diagnosis not present

## 2022-02-27 DIAGNOSIS — D500 Iron deficiency anemia secondary to blood loss (chronic): Secondary | ICD-10-CM | POA: Diagnosis not present

## 2022-02-27 LAB — CBC
HCT: 26.9 % — ABNORMAL LOW (ref 36.0–46.0)
Hemoglobin: 8.3 g/dL — ABNORMAL LOW (ref 12.0–15.0)
MCH: 25.9 pg — ABNORMAL LOW (ref 26.0–34.0)
MCHC: 30.9 g/dL (ref 30.0–36.0)
MCV: 83.8 fL (ref 80.0–100.0)
Platelets: 217 10*3/uL (ref 150–400)
RBC: 3.21 MIL/uL — ABNORMAL LOW (ref 3.87–5.11)
RDW: 18 % — ABNORMAL HIGH (ref 11.5–15.5)
WBC: 4.6 10*3/uL (ref 4.0–10.5)
nRBC: 0 % (ref 0.0–0.2)

## 2022-02-27 LAB — CREATININE, SERUM
Creatinine, Ser: 0.72 mg/dL (ref 0.44–1.00)
GFR, Estimated: >60 mL/min (ref >60)

## 2022-02-27 LAB — HEPARIN LEVEL (UNFRACTIONATED): Heparin Unfractionated: 0.36 IU/mL (ref 0.30–0.70)

## 2022-02-27 MED ORDER — ROSUVASTATIN CALCIUM 10 MG PO TABS: 10.0000 mg | ORAL_TABLET | Freq: Every day | ORAL | Status: DC

## 2022-02-27 MED ORDER — MOMETASONE FURO-FORMOTEROL FUM 100-5 MCG/ACT IN AERO
2.0000 | INHALATION_SPRAY | Freq: Two times a day (BID) | RESPIRATORY_TRACT | Status: DC
  Administered 2022-02-27 – 2022-03-01 (×5): 2 via RESPIRATORY_TRACT
  Filled 2022-02-27: qty 8.8

## 2022-02-27 MED ORDER — VITAMIN B-12 1000 MCG PO TABS
1000.0000 ug | ORAL_TABLET | Freq: Every day | ORAL | Status: DC
  Administered 2022-02-27 – 2022-03-01 (×3): 1000 ug via ORAL
  Filled 2022-02-27 (×3): qty 1

## 2022-02-27 MED ORDER — RIVAROXABAN 10 MG PO TABS
10.0000 mg | ORAL_TABLET | Freq: Every day | ORAL | Status: DC
  Administered 2022-02-27: 10 mg via ORAL
  Filled 2022-02-27: qty 1

## 2022-02-27 MED ORDER — ORAL CARE MOUTH RINSE
15.0000 mL | OROMUCOSAL | Status: DC | PRN
Start: 2022-02-27 — End: 2022-03-01

## 2022-02-27 MED ORDER — RIVAROXABAN 20 MG PO TABS
20.0000 mg | ORAL_TABLET | Freq: Every day | ORAL | Status: DC
Start: 2022-02-28 — End: 2022-03-01
  Administered 2022-02-28 – 2022-03-01 (×2): 20 mg via ORAL
  Filled 2022-02-27 (×2): qty 1

## 2022-02-27 MED ORDER — FLUTICASONE FUROATE-VILANTEROL 100-25 MCG/ACT IN AEPB
1.0000 | INHALATION_SPRAY | Freq: Every day | RESPIRATORY_TRACT | Status: DC
Start: 2022-02-27 — End: 2022-02-27

## 2022-02-27 MED ORDER — PANTOPRAZOLE SODIUM 40 MG PO TBEC
40.0000 mg | DELAYED_RELEASE_TABLET | ORAL | Status: DC
  Administered 2022-02-28: 40 mg via ORAL
  Filled 2022-02-27: qty 1

## 2022-02-27 MED ORDER — FUROSEMIDE 20 MG PO TABS
20.0000 mg | ORAL_TABLET | Freq: Every day | ORAL | Status: DC
  Administered 2022-02-28 – 2022-03-01 (×2): 20 mg via ORAL
  Filled 2022-02-27 (×3): qty 1

## 2022-02-27 MED FILL — Iron Sucrose Inj 20 MG/ML (Fe Equiv): INTRAVENOUS | Qty: 10 | Status: AC

## 2022-02-27 NOTE — Consult Note (Signed)
[place] for Heparin gtt Indication:  Acute on chronic DVT with low hemoglobin on admission  No Known Allergies  Patient Measurements: Height: '4\' 11"'$  (149.9 cm) Weight: 79.4 kg (175 lb) IBW/kg (Calculated) : 43.2 Heparin Dosing Weight: 61.6kg  Vital Signs: Temp: 99 F (37.2 C) (08/23 0311) Temp Source: Oral (08/23 0311) BP: 104/52 (08/23 0311) Pulse Rate: 82 (08/23 0311)  Labs: Recent Labs    02/25/22 0414 02/25/22 1738 02/26/22 0004 02/26/22 0508 02/26/22 0844 02/26/22 1735 02/27/22 0503  HGB 8.3*  --   --  8.5*  --   --  8.3*  HCT 26.7*  --   --  27.6*  --   --  26.9*  PLT 225  --   --  219  --   --  217  APTT  --  33  --   --   --   --   --   LABPROT  --  14.5  --   --   --   --   --   INR  --  1.1  --   --   --   --   --   HEPARINUNFRC  --  <0.10*   < >  --  0.42 0.38 0.36  CREATININE 0.84  --   --  0.74  --   --  0.72   < > = values in this interval not displayed.    Estimated Creatinine Clearance: 62.2 mL/min (by C-G formula based on SCr of 0.72 mg/dL).  Medications: Heparin Dosing Weight: 61.6kg PTA - Xarelto '10mg'$  QD (pt ran out approximately 2wks ago)  Assessment: Courtney Mack is a 66 y.o. female with PMH significant for DM, Chronic DVT, Endometrial CA, dCHF, Chronic blood loss anemia, GIB, gastric antral vascular ectasia, PE, lymphedema, IDA, HLD. Patient presented with headache, lightheadedness, SOB, and worsening of her chronic LE edema. Patient was taking Xarelto PTA, but last reported dose was >2 weeks ago due to patient running out of home supply. Pharmacy consulted for heparin gtt initiation and management.  8/21 LE [place] - acute on chronic left common femoral vein DVT and a chronic partially occlusive thrombus right common femoral vein and proximal superficial femoral vein.  Baseline Labs: aPTT 32, HL <0.10,  PT 14.5, INR 1.1. Hgb 8.3, Hct 26.7, Plt 225  Date Time aPTT/HL Rate/Comment 8/22  0004 HL  0.14 SUBtherapeutic x 1  8/22  0844 HL 0.42 therapeutic x1  8/22 1735 HL 0.38 therax2  8/23  0503 HL 0.36 Thera x 3   Goal of Therapy:  Heparin level 0.3-0.7 units/ml aPTT 66-102 seconds Monitor platelets by anticoagulation protocol: Yes   Plan:  Continue heparin infusion at 1150 units/hr HL daily with am labs Monitor CBC daily while on heparin  Renda Rolls, PharmD, [facility] 02/27/2022 5:51 AM

## 2022-02-27 NOTE — Plan of Care (Signed)

## 2022-02-27 NOTE — Evaluation (Signed)
Physical Therapy Evaluation Patient Details Name: Courtney Mack MRN: [id number] DOB: [birth year] Today's Date: 02/27/2022  History of Present Illness  66 yr old woman who presented to [facility] ED on 02/23/2022 with complaints of lightheadedness, headache, shortness of breath and worsening of her chronic lower extremity edema. She was found to have a low blood pressure with systolics in the 95-62 range. Her hemoglobin was 6.8, potassium was 3.3, and she was guaiac positive.  Clinical Impression  Pt pleasant and motivated with PT exam and treat, she was able to rise to standing and, slowly but safely, circumambulate the nurses' station with only minimal cuing and fatigue.  She does have baseline limp and clearly had forward lean on walker, but managed to walk >200 ft with relative ease.  O2 remained int he high 90s and HR rose from ~100 to 120 with the effort.       Recommendations for follow up therapy are one component of a multi-disciplinary discharge planning process, led by the attending physician.  Recommendations may be updated based on patient status, additional functional criteria and insurance authorization.  Follow Up Recommendations No PT follow up (will maintain on caseload while admitted)      Assistance Recommended at Discharge Intermittent Supervision/Assistance  Patient can return home with the following  A little help with bathing/dressing/bathroom;Assist for transportation;Help with stairs or ramp for entrance;Assistance with cooking/housework    Equipment Recommendations None recommended by PT  Recommendations for Other Services       Functional Status Assessment Patient has had a recent decline in their functional status and demonstrates the ability to make significant improvements in function in a reasonable and predictable amount of time.     Precautions / Restrictions Precautions Precautions: Fall Restrictions Weight Bearing Restrictions: No      Mobility  Bed  Mobility               General bed mobility comments: NT, in recliner pre and post session    Transfers Overall transfer level: Needs assistance Equipment used: Rolling walker (2 wheels) Transfers: Sit to/from Stand Sit to Stand: Min guard           General transfer comment: Pt was able to rise to standing and control descent well, minimal cuing needed and no safety issues    Ambulation/Gait Ambulation/Gait assistance: Supervision Gait Distance (Feet): 225 Feet Assistive device: Rolling walker (2 wheels)         General Gait Details: Pt with baseline limp (R knee and L hip) but was able to do prolonged bout of ambulation with forward flexed posture on walker but no LOBs, O2 remains in the high 90s with HR rising from ~100 to 120 bpm with the effort.  Stairs            Wheelchair Mobility    Modified Rankin (Stroke Patients Only)       Balance Overall balance assessment: Needs assistance Sitting-balance support: No upper extremity supported, Feet supported Sitting balance-Leahy Scale: Good     Standing balance support: Bilateral upper extremity supported, During functional activity Standing balance-Leahy Scale: Fair Standing balance comment: leaning on walker but no LOBs                             Pertinent Vitals/Pain Pain Assessment Pain Assessment: No/denies pain    Home Living Family/patient expects to be discharged to:: Private residence Living Arrangements: Children Available Help at Discharge: Family;Available 24 hours/day  Type of Home: House Home Access: Ramped entrance       Home Layout: One level Home Equipment: Rollator (4 wheels);BSC/3in1;Other (comment);Cane - single point;Adaptive equipment;Wheelchair - manual Additional Comments: sleeps in recliner    Prior Function Prior Level of Function : Independent/Modified Independent             Mobility Comments: amb household distances with rollator ADLs Comments:  pt daugther assists with LB dressing, bathing; Pt able to perform UB dressing, grooming with MOD I     Hand Dominance   Dominant Hand: Right    Extremity/Trunk Assessment   Upper Extremity Assessment Upper Extremity Assessment: Generalized weakness    Lower Extremity Assessment Lower Extremity Assessment: Generalized weakness       Communication   Communication: No difficulties  Cognition Arousal/Alertness: Awake/alert Behavior During Therapy: WFL for tasks assessed/performed Overall Cognitive Status: Within Functional Limits for tasks assessed                                          General Comments      Exercises     Assessment/Plan    PT Assessment Patient needs continued PT services  PT Problem List Decreased strength;Decreased mobility;Decreased activity tolerance       PT Treatment Interventions DME instruction;Therapeutic exercise;Gait training;Balance training;Functional mobility training;Therapeutic activities;Patient/family education    PT Goals (Current goals can be found in the Care Plan section)  Acute Rehab PT Goals Patient Stated Goal: go home PT Goal Formulation: With patient Time For Goal Achievement: 03/12/22 Potential to Achieve Goals: Good    Frequency Min 2X/week     Co-evaluation               AM-PAC PT "6 Clicks" Mobility  Outcome Measure Help needed turning from your back to your side while in a flat bed without using bedrails?: A Little Help needed moving from lying on your back to sitting on the side of a flat bed without using bedrails?: A Little Help needed moving to and from a bed to a chair (including a wheelchair)?: None Help needed standing up from a chair using your arms (e.g., wheelchair or bedside chair)?: None Help needed to walk in hospital room?: A Little Help needed climbing 3-5 steps with a railing? : A Lot 6 Click Score: 19    End of Session Equipment Utilized During Treatment: Gait  belt Activity Tolerance: Patient tolerated treatment well Patient left: with chair alarm set;with call bell/phone within reach Nurse Communication: Mobility status PT Visit Diagnosis: Other abnormalities of gait and mobility (R26.89);Muscle weakness (generalized) (M62.81)    Time: 7517-0017 PT Time Calculation (min) (ACUTE ONLY): 25 min   Charges:   PT Evaluation $PT Eval Low Complexity: 1 Low PT Treatments $Gait Training: 8-22 mins        Kreg Shropshire, DPT 02/27/2022, 1:36 PM

## 2022-02-27 NOTE — Care Management Important Message (Signed)
Important Message  Patient Details  Name: Courtney Mack MRN: [id number] Date of Birth: 12/13/1955   Medicare Important Message Given:  Yes     [name] 02/27/2022, 12:13 PM

## 2022-02-27 NOTE — Progress Notes (Signed)
PROGRESS NOTE    Courtney Mack  XLK:[id number] DOB: 29-Oct-1955 DOA: 02/23/2022 PCP: [facility]    Brief Narrative:  66 yr old woman who presented to [facility] ED on 02/23/2022 with complaints of lightheadedness, headache, shortness of breath and worsening of her chronic lower extremity edema. She was found to have a low blood pressure with systolics in the 36-64 range. Her hemoglobin was 6.8, potassium was 3.3, and she was guaiac positive.    Lower extremity doppler demonstrated acute on chronic left common femoral vein DVT and a chronic partially occlusive thrombus right common femoral vein and proximal superficial femoral vein.   CTA chest and abdomen demonstrated no acute pulmonary embolus. There was stable minimal chronic pulmonary embolus in right lower lobar pulmonary artery. There was evidence for extensive bronchitis and airway impaction in the lower lobes bilaterally with endobronchial debris suggesting superimposed infection or aspiration. There is also evidence for small airway disease. There is a left percutaneous nephrostomy catheter in expected position. There is no hydronephrosis. There was suggestion of cystitis with marked circumferential stranding. 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 This may be related to progressive subcutaneous body wall edema. There is also peripheral vascular disease with hemodynamically significantly stenosis of the celiac axis and superior mesenteric artery proximally. Suggestion is to correlate clinically for signs and symptoms of chronic mesenteric ischemia. Suggestion is also for CT arteriography.   Patient has been started on heparin drip for chronic DVT.  Gastroenterology without plans for endoscopic evaluation.  Blood cultures have now grown out 2/2 positive for Staphylococcus epidermidis.  Patient has been on IV  antibiotics.  Infectious disease on board.  Successful nephrostomy tube exchange on 8/21 Repeat blood cultures negative.  Infectious disease suspects that blood culture with Staphylococcus epidermidis was likely a contaminant.  Antibiotics have been stopped.   Assessment & Plan:   Active Problems:   Symptomatic anemia due to chronic blood loss   Hypotension   Gastric antral vascular ectasia   Diabetes mellitus without complication (HCC)   Chronic diastolic CHF (congestive heart failure) (HCC)   Endometrial cancer (HCC)   Acute on chronic right femoral DVT (deep venous thrombosis) (HCC)   Bilateral hydronephrosis s/p left nephrostomy tube June 2023   Chronic radiation cystitis   History of pulmonary embolus (PE)   Lymphedema   Symptomatic anemia   Acute bronchitis   Staphylococcus epidermidis bacteremia  Symptomatic anemia due to chronic blood loss History of upper GI bleed 7/25/EGD with GAVE s/p APC History of radiation cystitis with intermittent hematuria Chronic anticoagulation Etiology suspect GI given positive stool guaiac in the ED, lower suspicion for GU etiology given clear urine in nephrostomy bag Has received 2 unit transfusion so far Hemoglobin has been stable Plan: DC heparin GTT Restart Xarelto Continue intravenous PPI Transfusion threshold 8.0    Hypotension Secondary to hypovolemia from ongoing blood loss, diuretic treatment Hold antihypertensives   Diabetes mellitus without complication (HCC) Sliding scale insulin coverage   Chronic diastolic CHF (congestive heart failure) (HCC) Compensated Hold Lasix due to hypotension   Endometrial cancer [facility]) Oncology follow up   Staphylococcus epidermidis bacteremia, ruled out 2/2 culture have grown out staph epi. Infectious disease has been consulted.  Blood culture taken from peripheral line.  Repeat cultures negative.  Felt to be contaminant.  Antibiotics discontinued.  Monitor vitals and fever  curve.  Acute bronchitis Appears resolved  Symptomatic anemia The patient received 2 unit of PRBC's in transfusion. GI has been consulted as the patient has had multiple recent GIB issues. PPI and supportive care recommended.  No plans for endoscopic evaluation   Lymphedema Holding Lasix due to soft blood pressure   History of pulmonary embolus (PE) Xarelto restarted 8/23   Bilateral hydronephrosis s/p left nephrostomy tube June 2023 Chronic radiation cystitis with intermittent hematuria s/p fulguration June 2023 Possible UTI UA with moderate leukocyte esterases but normal WBC and lactic acid and procalcitonin Patient received cefepime and Flagyl in the ED Successful urostomy tube exchange on 8/21 Follow cultures from nephrostomy, no growth to date All antibiotics stopped   Acute on chronic right femoral DVT (deep venous thrombosis) (HCC) Chronic left femoral DVT Chronic PE History of IVC filter Heparin drip will be discontinued.  We will restart Xarelto.  Patient was previously on 10 mg however considering severity of clot we will start a 20 mg daily dosing without load   DVT prophylaxis: Xarelto Code Status: Full Family Communication: daughter Jossie Ng [phone] on 8/23 Disposition Plan: Status is: Inpatient Remains inpatient appropriate because: Acute on chronic blood loss anemia   Level of care: Med-Surg  Consultants:  Infectious disease  Procedures:  None  Antimicrobials: Vancomycin   Subjective: Seen and examined.  Pale appearing.  No visible distress otherwise.  No pain complaints  Objective: Vitals:   02/26/22 0750 02/26/22 1554 02/26/22 1941 02/27/22 0311  BP: (!) 104/58 (!) 113/54 (!) 100/55 (!) 104/52  Pulse: 75 77 83 82  Resp: '18 18 20 18  '$ Temp: 97.8 F (36.6 C) 98.2 F (36.8 C) 98.5 F (36.9 C) 99 F (37.2 C)  TempSrc:  Oral Oral Oral  SpO2: 95% 97% 94% 94%  Weight:      Height:        Intake/Output Summary (Last 24 hours) at  02/27/2022 1247 Last data filed at 02/27/2022 4287 Gross per 24 hour  Intake 1137.72 ml  Output 3300 ml  Net -2162.28 ml   Filed Weights   02/23/22 2310  Weight: 79.4 kg    Examination:  General exam: NAD.  Pale Respiratory system: Clear to auscultation. Respiratory effort normal. Cardiovascular system: S1-S2, RRR, no murmurs, no pedal edema Gastrointestinal system: Soft, NT/ND, normal bowel sounds Central nervous system: Alert and oriented. No focal neurological deficits. Extremities: Symmetric 5 x 5 power. Skin: No rashes, lesions or ulcers Psychiatry: Judgement and insight appear normal. Mood & affect appropriate.     Data Reviewed: I have personally reviewed following labs and imaging studies  CBC: Recent Labs  Lab 02/23/22 2327 02/24/22 0907 02/25/22 0414 02/26/22 0508 02/27/22 0503  WBC 5.3 4.6 5.1 4.6 4.6  NEUTROABS 3.5  --  3.5  --   --   HGB 6.8* 7.2* 8.3* 8.5* 8.3*  HCT 23.1* 23.6* 26.7* 27.6* 26.9*  MCV 85.9 84.0 84.2 84.7 83.8  PLT 288 240 225 219 681   Basic Metabolic Panel: Recent Labs  Lab 02/23/22 2327 02/24/22 0201 02/25/22 0414 02/26/22 0508 02/27/22 0503  NA 139 140 139 140  --   K 3.3* 3.2* 4.1 3.9  --   CL 105 109 110 110  --   CO2 '27 23 24 25  '$ --   GLUCOSE 152* 103* 95 96  --   BUN '18 17 18 15  '$ --   CREATININE 0.89 0.82 0.84 0.74 0.72  CALCIUM 8.6* 7.9* 8.2* 8.4*  --    GFR: Estimated Creatinine Clearance: 62.2  mL/min (by C-G formula based on SCr of 0.72 mg/dL). Liver Function Tests: Recent Labs  Lab 02/23/22 2327  AST 22  ALT 11  ALKPHOS 41  BILITOT 0.7  PROT 6.7  ALBUMIN 3.2*   No results for input(s): "LIPASE", "AMYLASE" in the last 168 hours. No results for input(s): "AMMONIA" in the last 168 hours. Coagulation Profile: Recent Labs  Lab 02/25/22 1738  INR 1.1   Cardiac Enzymes: No results for input(s): "CKTOTAL", "CKMB", "CKMBINDEX", "TROPONINI" in the last 168 hours. BNP (last 3 results) No results for  input(s): "PROBNP" in the last 8760 hours. HbA1C: No results for input(s): "HGBA1C" in the last 72 hours. CBG: No results for input(s): "GLUCAP" in the last 168 hours. Lipid Profile: No results for input(s): "CHOL", "HDL", "LDLCALC", "TRIG", "CHOLHDL", "LDLDIRECT" in the last 72 hours. Thyroid Function Tests: No results for input(s): "TSH", "T4TOTAL", "FREET4", "T3FREE", "THYROIDAB" in the last 72 hours. Anemia Panel: No results for input(s): "VITAMINB12", "FOLATE", "FERRITIN", "TIBC", "IRON", "RETICCTPCT" in the last 72 hours. Sepsis Labs: Recent Labs  Lab 02/23/22 2358 02/24/22 0234  PROCALCITON <0.10  --   LATICACIDVEN  --  0.7    Recent Results (from the past 240 hour(s))  SARS Coronavirus 2 by RT PCR (hospital order, performed in [facility] hospital lab) *cepheid single result test* Anterior Nasal Swab     Status: None   Collection Time: 02/24/22 12:38 AM   Specimen: Anterior Nasal Swab  Result Value Ref Range Status   SARS Coronavirus 2 by RT PCR NEGATIVE NEGATIVE Final    Comment: (NOTE) SARS-CoV-2 target nucleic acids are NOT DETECTED.  The SARS-CoV-2 RNA is generally detectable in upper and lower respiratory specimens during the acute phase of infection. The lowest concentration of SARS-CoV-2 viral copies this assay can detect is 250 copies / mL. A negative result does not preclude SARS-CoV-2 infection and should not be used as the sole basis for treatment or other patient management decisions.  A negative result may occur with improper specimen collection / handling, submission of specimen other than nasopharyngeal swab, presence of viral mutation(s) within the areas targeted by this assay, and inadequate number of viral copies (<250 copies / mL). A negative result must be combined with clinical observations, patient history, and epidemiological information.  Fact Sheet for Patients:   https://www.patel.info/  Fact Sheet for Healthcare  Providers: https://hall.com/  This test is not yet approved or  cleared by the [place] FDA and has been authorized for detection and/or diagnosis of SARS-CoV-2 by FDA under an Emergency Use Authorization (EUA).  This EUA will remain in effect (meaning this test can be used) for the duration of the COVID-19 declaration under Section 564(b)(1) of the Act, 21 U.S.C. section 360bbb-3(b)(1), unless the authorization is terminated or revoked sooner.  Performed at [facility], [address], [place], [place] [address]   Urine Culture     Status: Abnormal   Collection Time: 02/24/22  2:01 AM   Specimen: Urine, Clean Catch  Result Value Ref Range Status   Specimen Description   Final    URINE, CLEAN CATCH Performed at [facility], [address], [place], [place] [address]    Special Requests   Final    NONE Performed at [facility], [address] [place],  [address]    Culture MULTIPLE SPECIES PRESENT, SUGGEST RECOLLECTION (A)  Final   Report Status 02/25/2022 FINAL  Final  Blood culture (routine x 2)  Status: Abnormal (Preliminary result)   Collection Time: 02/24/22  2:35 AM   Specimen: BLOOD  Result Value Ref Range Status   Specimen Description   Final    BLOOD LEFT FA Performed at [facility], [address] [place], [place] [address]    Special Requests   Final    BOTTLES DRAWN AEROBIC AND ANAEROBIC Blood Culture results may not be optimal due to an inadequate volume of blood received in culture bottles Performed at [facility], [address], [place], [place] [address]    Culture  Setup Time   Final    GRAM POSITIVE COCCI IN BOTH AEROBIC AND ANAEROBIC BOTTLES CRITICAL RESULT CALLED TO, READ BACK BY AND VERIFIED WITH: JASON ROBBINS 02/25/22 0051 DE    Culture STAPHYLOCOCCUS EPIDERMIDIS (A)  Final   Report Status PENDING  Incomplete  Blood culture (routine x 2)     Status:  Abnormal (Preliminary result)   Collection Time: 02/24/22  2:35 AM   Specimen: BLOOD  Result Value Ref Range Status   Specimen Description   Final    BLOOD LEFT UPPER ARM Performed at [facility], [address] [place], [place] [address]    Special Requests   Final    BOTTLES DRAWN AEROBIC AND ANAEROBIC Blood Culture adequate volume Performed at [facility], [address] [place], [place] [address]    Culture  Setup Time   Final    Organism ID to follow [place] CRITICAL RESULT CALLED TO, READ BACK BY AND VERIFIED WITH: JASON ROBBINS 02/24/22 2156 DE    Culture (A)  Final    STAPHYLOCOCCUS HOMINIS THE SIGNIFICANCE OF ISOLATING THIS ORGANISM FROM A SINGLE SET OF BLOOD CULTURES WHEN MULTIPLE SETS ARE DRAWN IS UNCERTAIN. PLEASE NOTIFY THE MICROBIOLOGY DEPARTMENT WITHIN ONE WEEK IF SPECIATION AND SENSITIVITIES ARE REQUIRED. STAPHYLOCOCCUS EPIDERMIDIS SUSCEPTIBILITIES TO FOLLOW Performed at [facility], [place] [address], [place], [place] [address]    Report Status PENDING  Incomplete  Blood Culture ID Panel (Reflexed)     Status: Abnormal   Collection Time: 02/24/22  2:35 AM  Result Value Ref Range Status   Enterococcus faecalis NOT DETECTED NOT DETECTED Final   Enterococcus Faecium NOT DETECTED NOT DETECTED Final   Listeria monocytogenes NOT DETECTED NOT DETECTED Final   Staphylococcus species DETECTED (A) NOT DETECTED Final    Comment: CRITICAL RESULT CALLED TO, READ BACK BY AND VERIFIED WITH: JASON ROBBINS PHARMD AT 2156 02/24/2022 DE    Staphylococcus aureus (BCID) NOT DETECTED NOT DETECTED Final   Staphylococcus epidermidis DETECTED (A) NOT DETECTED Final    Comment: Methicillin (oxacillin) resistant coagulase negative staphylococcus. Possible blood culture contaminant (unless isolated from more than one blood culture draw or clinical case suggests pathogenicity). No antibiotic treatment is indicated for blood   culture contaminants. CRITICAL RESULT CALLED TO, READ BACK BY AND VERIFIED WITH: Violeta Gelinas PHARMD AT 2156 02/24/2022 DE    Staphylococcus lugdunensis NOT DETECTED NOT DETECTED Final   Streptococcus species NOT DETECTED NOT DETECTED Final   Streptococcus agalactiae NOT DETECTED NOT DETECTED Final   Streptococcus pneumoniae NOT DETECTED NOT DETECTED Final   Streptococcus pyogenes NOT DETECTED NOT DETECTED Final   A.calcoaceticus-baumannii NOT DETECTED NOT DETECTED Final   Bacteroides fragilis NOT DETECTED NOT DETECTED Final   Enterobacterales NOT DETECTED NOT DETECTED Final   Enterobacter cloacae complex NOT DETECTED NOT DETECTED Final   Escherichia coli NOT DETECTED NOT DETECTED Final   Klebsiella aerogenes  NOT DETECTED NOT DETECTED Final   Klebsiella oxytoca NOT DETECTED NOT DETECTED Final   Klebsiella pneumoniae NOT DETECTED NOT DETECTED Final   Proteus species NOT DETECTED NOT DETECTED Final   Salmonella species NOT DETECTED NOT DETECTED Final   Serratia marcescens NOT DETECTED NOT DETECTED Final   Haemophilus influenzae NOT DETECTED NOT DETECTED Final   Neisseria meningitidis NOT DETECTED NOT DETECTED Final   Pseudomonas aeruginosa NOT DETECTED NOT DETECTED Final   Stenotrophomonas maltophilia NOT DETECTED NOT DETECTED Final   Candida albicans NOT DETECTED NOT DETECTED Final   Candida auris NOT DETECTED NOT DETECTED Final   Candida glabrata NOT DETECTED NOT DETECTED Final   Candida krusei NOT DETECTED NOT DETECTED Final   Candida parapsilosis NOT DETECTED NOT DETECTED Final   Candida tropicalis NOT DETECTED NOT DETECTED Final   Cryptococcus neoformans/gattii NOT DETECTED NOT DETECTED Final   Methicillin resistance mecA/C DETECTED (A) NOT DETECTED Final    Comment: CRITICAL RESULT CALLED TO, READ BACK BY AND VERIFIED WITHVioleta Gelinas PHARMD AT 2156 02/24/2022 DE Performed at [facility], [address] [place], [place] [address]   Urine Culture     Status:  Abnormal   Collection Time: 02/25/22  3:05 PM   Specimen: Urine, Random  Result Value Ref Range Status   Specimen Description   Final    URINE, RANDOM Performed at [facility], [address] [place], [place] [address]    Special Requests   Final    NONE Performed at [facility], [address], [place], [place] [address]    Culture MULTIPLE SPECIES PRESENT, SUGGEST RECOLLECTION (A)  Final   Report Status 02/26/2022 FINAL  Final  Culture, blood (Routine X 2) w Reflex to ID Panel     Status: None (Preliminary result)   Collection Time: 02/25/22  4:28 PM   Specimen: BLOOD RIGHT HAND  Result Value Ref Range Status   Specimen Description BLOOD RIGHT HAND  Final   Special Requests   Final    BOTTLES DRAWN AEROBIC AND ANAEROBIC Blood Culture adequate volume   Culture   Final    NO GROWTH 2 DAYS Performed at [facility], [address], [place], [place] [address]    Report Status PENDING  Incomplete  Culture, blood (Routine X 2) w Reflex to ID Panel     Status: None (Preliminary result)   Collection Time: 02/25/22  4:38 PM   Specimen: BLOOD LEFT HAND  Result Value Ref Range Status   Specimen Description BLOOD LEFT HAND  Final   Special Requests   Final    BOTTLES DRAWN AEROBIC AND ANAEROBIC Blood Culture results may not be optimal due to an excessive volume of blood received in culture bottles   Culture   Final    NO GROWTH 2 DAYS Performed at [facility], [address] [place], [place] [address]    Report Status PENDING  Incomplete  Respiratory (~20 pathogens) panel by PCR     Status: None   Collection Time: 02/25/22  5:10 PM   Specimen: Nasopharyngeal Swab; Respiratory  Result Value Ref Range Status   Adenovirus NOT DETECTED NOT DETECTED Final   Coronavirus 229E NOT DETECTED NOT DETECTED Final    Comment: (NOTE) The Coronavirus on the Respiratory Panel, DOES NOT test for the novel  Coronavirus (2019 nCoV)    Coronavirus HKU1  NOT DETECTED NOT DETECTED Final   Coronavirus NL63 NOT DETECTED NOT DETECTED Final   Coronavirus OC43 NOT  DETECTED NOT DETECTED Final   Metapneumovirus NOT DETECTED NOT DETECTED Final   Rhinovirus / Enterovirus NOT DETECTED NOT DETECTED Final   Influenza A NOT DETECTED NOT DETECTED Final   Influenza B NOT DETECTED NOT DETECTED Final   Parainfluenza Virus 1 NOT DETECTED NOT DETECTED Final   Parainfluenza Virus 2 NOT DETECTED NOT DETECTED Final   Parainfluenza Virus 3 NOT DETECTED NOT DETECTED Final   Parainfluenza Virus 4 NOT DETECTED NOT DETECTED Final   Respiratory Syncytial Virus NOT DETECTED NOT DETECTED Final   Bordetella pertussis NOT DETECTED NOT DETECTED Final   Bordetella Parapertussis NOT DETECTED NOT DETECTED Final   Chlamydophila pneumoniae NOT DETECTED NOT DETECTED Final   Mycoplasma pneumoniae NOT DETECTED NOT DETECTED Final    Comment: Performed at [facility], [place] [address], [place], [place] [address]         Radiology Studies: ECHOCARDIOGRAM COMPLETE  Result Date: 02/25/2022    ECHOCARDIOGRAM REPORT   Patient Name:   ADER FRITZE Date of Exam: 02/25/2022 Medical Rec #:  [id number]          Height:       59.0 in Accession #:    [id number]         Weight:       175.0 lb Date of Birth:  [birth year]          BSA:          1.743 m Patient Age:    67 years           BP:           113/61 mmHg Patient Gender: F                  HR:           73 bpm. Exam Location:  [facility] Procedure: 2D Echo, Cardiac Doppler and Color Doppler Indications:     R78.81 Bacteremia  History:         Patient has prior history of Echocardiogram examinations, most                  recent 09/12/2021. CHF; Risk Factors:Diabetes, Dyslipidemia and                  Hypertension.  Sonographer:     Rosalia Hammers Referring Phys:  [id number] Karie Kirks Diagnosing Phys: Yolonda Kida MD  Sonographer Comments: Image acquisition challenging due to patient body habitus. IMPRESSIONS  1. Left ventricular ejection  fraction, by estimation, is 65 to 70%. The left ventricle has normal function. The left ventricle has no regional wall motion abnormalities. Left ventricular diastolic parameters are consistent with Grade I diastolic dysfunction (impaired relaxation).  2. Right ventricular systolic function is normal. The right ventricular size is normal.  3. The mitral valve is normal in structure. No evidence of mitral valve regurgitation.  4. The aortic valve is normal in structure. Aortic valve regurgitation is not visualized. FINDINGS  Left Ventricle: Left ventricular ejection fraction, by estimation, is 65 to 70%. The left ventricle has normal function. The left ventricle has no regional wall motion abnormalities. The left ventricular internal cavity size was normal in size. There is  no left ventricular hypertrophy. Left ventricular diastolic parameters are consistent with Grade I diastolic dysfunction (impaired relaxation). Right Ventricle: The right ventricular size is normal. No increase in right ventricular wall thickness. Right ventricular systolic function is normal. Left Atrium: Left atrial size was normal in size. Right Atrium: Right  atrial size was normal in size. Pericardium: There is no evidence of pericardial effusion. Mitral Valve: The mitral valve is normal in structure. No evidence of mitral valve regurgitation. Tricuspid Valve: The tricuspid valve is normal in structure. Tricuspid valve regurgitation is mild. Aortic Valve: The aortic valve is normal in structure. Aortic valve regurgitation is not visualized. Aortic valve mean gradient measures 7.0 mmHg. Aortic valve peak gradient measures 11.8 mmHg. Aortic valve area, by VTI measures 1.99 cm. Pulmonic Valve: The pulmonic valve was normal in structure. Pulmonic valve regurgitation is not visualized. Aorta: The ascending aorta was not well visualized. IAS/Shunts: No atrial level shunt detected by color flow Doppler.  LEFT VENTRICLE PLAX 2D LVIDd:         3.09  cm   Diastology LVIDs:         2.01 cm   LV e' medial:    7.29 cm/s LV PW:         1.34 cm   LV E/e' medial:  13.9 LV IVS:        0.95 cm   LV e' lateral:   8.27 cm/s LVOT diam:     1.80 cm   LV E/e' lateral: 12.2 LV SV:         74 LV SV Index:   43 LVOT Area:     2.54 cm  RIGHT VENTRICLE RV Basal diam:  2.93 cm RV S prime:     16.50 cm/s TAPSE (M-mode): 2.4 cm LEFT ATRIUM             Index        RIGHT ATRIUM           Index LA diam:        3.10 cm 1.78 cm/m   RA Area:     12.40 cm LA Vol (A2C):   45.2 ml 25.94 ml/m  RA Volume:   23.70 ml  13.60 ml/m LA Vol (A4C):   29.3 ml 16.81 ml/m LA Biplane Vol: 38.5 ml 22.09 ml/m  AORTIC VALVE AV Area (Vmax):    2.00 cm AV Area (Vmean):   1.78 cm AV Area (VTI):     1.99 cm AV Vmax:           172.00 cm/s AV Vmean:          127.000 cm/s AV VTI:            0.373 m AV Peak Grad:      11.8 mmHg AV Mean Grad:      7.0 mmHg LVOT Vmax:         135.00 cm/s LVOT Vmean:        88.800 cm/s LVOT VTI:          0.292 m LVOT/AV VTI ratio: 0.78  AORTA Ao Root diam: 3.60 cm MITRAL VALVE MV Area (PHT): 3.58 cm     SHUNTS MV Decel Time: 212 msec     Systemic VTI:  0.29 m MV E velocity: 101.00 cm/s  Systemic Diam: 1.80 cm MV A velocity: 127.00 cm/s MV E/A ratio:  0.80 Dwayne D Callwood MD Electronically signed by Yolonda Kida MD Signature Date/Time: 02/25/2022/5:27:16 PM    Final         Scheduled Meds:  sodium chloride   Intravenous Once   cyanocobalamin  1,000 mcg Oral Daily   furosemide  20 mg Oral Daily   mometasone-formoterol  2 puff Inhalation BID   [START ON 02/28/2022] pantoprazole  40 mg Oral Kaleb.Sensing   [  START ON 02/28/2022] rivaroxaban  20 mg Oral Daily   rosuvastatin  10 mg Oral Daily   Continuous Infusions:  sodium chloride 10 mL/hr at 02/25/22 0349     LOS: 3 days      Sidney Ace, MD [facility]   If 7PM-7AM, please contact night-coverage  02/27/2022, 12:47 PM

## 2022-02-27 NOTE — Evaluation (Signed)
Occupational Therapy Evaluation Patient Details Name: Courtney Mack MRN: [id number] DOB: October 22, 1955 Today's Date: 02/27/2022   History of Present Illness 66 yr old woman who presented to [facility] ED on 02/23/2022 with complaints of lightheadedness, headache, shortness of breath and worsening of her chronic lower extremity edema. She was found to have a low blood pressure with systolics in the 29-92 range. Her hemoglobin was 6.8, potassium was 3.3, and she was guaiac positive.   Clinical Impression   Patient presenting with decreased Ind in self care, balance, functional mobility/transfers, endurance, and safety awareness. Patient reports being mod I with use of rollator. Pt lives with daughter and 5 grandchildren (ages 77-18). She reports never being at home alone. Patient currently functioning at min guard with RW. She does fatigue quickly but is motivated for therapeutic intervention. Pt seated in recliner chair at end of session and RN present to give medications.  Patient will benefit from acute OT to increase overall independence in the areas of ADLs, functional mobility, and safety awareness in order to safely discharge home with family.      Recommendations for follow up therapy are one component of a multi-disciplinary discharge planning process, led by the attending physician.  Recommendations may be updated based on patient status, additional functional criteria and insurance authorization.   Follow Up Recommendations  Home health OT    Assistance Recommended at Discharge Intermittent Supervision/Assistance  Patient can return home with the following A little help with walking and/or transfers;A little help with bathing/dressing/bathroom;Assist for transportation;Help with stairs or ramp for entrance    Functional Status Assessment  Patient has had a recent decline in their functional status and demonstrates the ability to make significant improvements in function in a reasonable and  predictable amount of time.  Equipment Recommendations  None recommended by OT       Precautions / Restrictions Precautions Precautions: Fall Restrictions Weight Bearing Restrictions: No      Mobility Bed Mobility Overal bed mobility: Needs Assistance Bed Mobility: Supine to Sit     Supine to sit: Min assist     General bed mobility comments: min A for trunk support and min cuing for technique and hand placement    Transfers Overall transfer level: Needs assistance Equipment used: Rolling walker (2 wheels) Transfers: Sit to/from Stand, Bed to chair/wheelchair/BSC Sit to Stand: Min guard     Step pivot transfers: Min guard            Balance Overall balance assessment: Needs assistance Sitting-balance support: No upper extremity supported, Feet supported Sitting balance-Leahy Scale: Good     Standing balance support: Bilateral upper extremity supported, During functional activity Standing balance-Leahy Scale: Fair Standing balance comment: use of RW                           ADL either performed or assessed with clinical judgement   ADL Overall ADL's : Needs assistance/impaired                                       General ADL Comments: supervision - min guard for safety with use of RW     Vision Patient Visual Report: No change from baseline              Pertinent Vitals/Pain Pain Assessment Pain Assessment: No/denies pain     Hand Dominance Right  Extremity/Trunk Assessment Upper Extremity Assessment Upper Extremity Assessment: Generalized weakness   Lower Extremity Assessment Lower Extremity Assessment: Generalized weakness       Communication Communication Communication: No difficulties   Cognition Arousal/Alertness: Awake/alert Behavior During Therapy: WFL for tasks assessed/performed Overall Cognitive Status: Within Functional Limits for tasks assessed                                                   Home Living Family/patient expects to be discharged to:: Private residence Living Arrangements: Children Available Help at Discharge: Family;Available 24 hours/day Type of Home: House Home Access: Ramped entrance     Home Layout: One level     Bathroom Shower/Tub: Occupational psychologist: Handicapped height Bathroom Accessibility: Yes   Home Equipment: Rollator (4 wheels);BSC/3in1;Other (comment);Cane - single point;Adaptive equipment;Wheelchair - manual (lift chair) Adaptive Equipment: Financial trader Comments: sleeps in recliner      Prior Functioning/Environment Prior Level of Function : Independent/Modified Independent             Mobility Comments: amb household distances with rollator ADLs Comments: pt daugther assists with LB dressing, bathing; Pt able to perform UB dressing, grooming with MOD I        OT Problem List: Decreased strength;Decreased activity tolerance;Decreased safety awareness;Impaired balance (sitting and/or standing);Decreased knowledge of use of DME or AE      OT Treatment/Interventions: Self-care/ADL training;Therapeutic exercise;Therapeutic activities;DME and/or AE instruction;Manual therapy;Balance training;Patient/family education;Energy conservation    OT Goals(Current goals can be found in the care plan section) Acute Rehab OT Goals Patient Stated Goal: to go home OT Goal Formulation: With patient Time For Goal Achievement: 03/13/22 Potential to Achieve Goals: Fair  OT Frequency: Min 2X/week       AM-PAC OT "6 Clicks" Daily Activity     Outcome Measure Help from another person eating meals?: None Help from another person taking care of personal grooming?: A Little Help from another person toileting, which includes using toliet, bedpan, or urinal?: A Little Help from another person bathing (including washing, rinsing, drying)?: A Little Help from another person to put on and taking off regular  upper body clothing?: None Help from another person to put on and taking off regular lower body clothing?: A Little 6 Click Score: 20   End of Session Equipment Utilized During Treatment: Rolling walker (2 wheels) Nurse Communication: Mobility status  Activity Tolerance: Patient tolerated treatment well Patient left: with call bell/phone within reach;in chair;with nursing/sitter in room  OT Visit Diagnosis: Unsteadiness on feet (R26.81);Muscle weakness (generalized) (M62.81)                Time: 9485-4627 OT Time Calculation (min): 21 min Charges:  OT General Charges $OT Visit: 1 Visit OT Evaluation $OT Eval Moderate Complexity: 1 Mod OT Treatments $Therapeutic Activity: 8-22 mins  Darleen Crocker, MS, OTR/L , CBIS ascom [phone]  02/27/22, 2:23 PM

## 2022-02-27 NOTE — Progress Notes (Signed)
Mobility Specialist - Progress Note    02/27/22 1520  Mobility  Activity Ambulated with assistance in hallway  Level of Assistance Standby assist, set-up cues, supervision of patient - no hands on  Assistive Device Front wheel walker  Distance Ambulated (ft) 40 ft  Activity Response Tolerated well  $Mobility charge 1 Mobility   Pt sitting in chair upon entry, utilizing RA. Pt ambulated 20 ft down the hallway using RW with SBA. Pt returned to room, left supine with needs in reach. No complaints.   Candie Mile Mobility Specialist 02/27/22 3:21 PM

## 2022-02-28 ENCOUNTER — Inpatient Hospital Stay: Payer: Medicaid Other | Admitting: Oncology

## 2022-02-28 ENCOUNTER — Inpatient Hospital Stay: Payer: Medicaid Other

## 2022-02-28 DIAGNOSIS — D500 Iron deficiency anemia secondary to blood loss (chronic): Secondary | ICD-10-CM | POA: Diagnosis not present

## 2022-02-28 DIAGNOSIS — Z86711 Personal history of pulmonary embolism: Secondary | ICD-10-CM | POA: Diagnosis not present

## 2022-02-28 LAB — BPAM RBC
Blood Product Expiration Date: 202309182359
Blood Product Expiration Date: 202309182359
Blood Product Expiration Date: 202309202359
ISSUE DATE / TIME: 202308200416
ISSUE DATE / TIME: 202308200416
ISSUE DATE / TIME: 202308201308
Unit Type and Rh: 6200
Unit Type and Rh: 6200
Unit Type and Rh: 6200

## 2022-02-28 LAB — CULTURE, BLOOD (ROUTINE X 2): Special Requests: ADEQUATE

## 2022-02-28 LAB — CBC
HCT: 26.7 % — ABNORMAL LOW (ref 36.0–46.0)
Hemoglobin: 8.2 g/dL — ABNORMAL LOW (ref 12.0–15.0)
MCH: 25.9 pg — ABNORMAL LOW (ref 26.0–34.0)
MCHC: 30.7 g/dL (ref 30.0–36.0)
MCV: 84.2 fL (ref 80.0–100.0)
Platelets: 223 10*3/uL (ref 150–400)
RBC: 3.17 MIL/uL — ABNORMAL LOW (ref 3.87–5.11)
RDW: 18.1 % — ABNORMAL HIGH (ref 11.5–15.5)
WBC: 4.2 10*3/uL (ref 4.0–10.5)
nRBC: 0 % (ref 0.0–0.2)

## 2022-02-28 LAB — TYPE AND SCREEN
ABO/RH(D): A POS
Antibody Screen: POSITIVE
Donor AG Type: NEGATIVE
Donor AG Type: NEGATIVE
Unit division: 0
Unit division: 0
Unit division: 0
Unit division: 0

## 2022-02-28 MED ORDER — PANTOPRAZOLE SODIUM 40 MG PO TBEC
40.0000 mg | DELAYED_RELEASE_TABLET | Freq: Two times a day (BID) | ORAL | Status: DC
  Administered 2022-02-28 – 2022-03-01 (×2): 40 mg via ORAL
  Filled 2022-02-28 (×2): qty 1

## 2022-02-28 NOTE — Progress Notes (Signed)
PROGRESS NOTE    Courtney Mack  KGY:[id number] DOB: 07/23/1955 DOA: 02/23/2022 PCP: [facility] [place]    Brief Narrative:  66 yr old woman who presented to [facility] ED on 02/23/2022 with complaints of lightheadedness, headache, shortness of breath and worsening of her chronic lower extremity edema. She was found to have a low blood pressure with systolics in the 02-63 range. Her hemoglobin was 6.8, potassium was 3.3, and she was guaiac positive.    Lower extremity doppler demonstrated acute on chronic left common femoral vein DVT and a chronic partially occlusive thrombus right common femoral vein and proximal superficial femoral vein.   CTA chest and abdomen demonstrated no acute pulmonary embolus. There was stable minimal chronic pulmonary embolus in right lower lobar pulmonary artery. There was evidence for extensive bronchitis and airway impaction in the lower lobes bilaterally with endobronchial debris suggesting superimposed infection or aspiration. There is also evidence for small airway disease. There is a left percutaneous nephrostomy catheter in expected position. There is no hydronephrosis. There was suggestion of cystitis with marked circumferential stranding. 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 This may be related to progressive subcutaneous body wall edema. There is also peripheral vascular disease with hemodynamically significantly stenosis of the celiac axis and superior mesenteric artery proximally. Suggestion is to correlate clinically for signs and symptoms of chronic mesenteric ischemia. Suggestion is also for CT arteriography.   Patient has been started on heparin drip for chronic DVT.  Gastroenterology without plans for endoscopic evaluation.  Blood cultures have now grown out 2/2 positive for Staphylococcus epidermidis.  Patient has been on IV  antibiotics.  Infectious disease on board.  Successful nephrostomy tube exchange on 8/21 Repeat blood cultures negative.  Infectious disease suspects that blood culture with Staphylococcus epidermidis was likely a contaminant.  Antibiotics have been stopped.   Assessment & Plan:   Active Problems:   Symptomatic anemia due to chronic blood loss   Hypotension   Gastric antral vascular ectasia   Diabetes mellitus without complication (HCC)   Chronic diastolic CHF (congestive heart failure) (HCC)   Endometrial cancer (HCC)   Acute on chronic right femoral DVT (deep venous thrombosis) (HCC)   Bilateral hydronephrosis s/p left nephrostomy tube June 2023   Chronic radiation cystitis   History of pulmonary embolus (PE)   Lymphedema   Symptomatic anemia   Acute bronchitis   Staphylococcus epidermidis bacteremia  Symptomatic anemia due to chronic blood loss History of upper GI bleed 7/25/EGD with GAVE s/p APC History of radiation cystitis with intermittent hematuria Chronic anticoagulation Etiology suspect GI given positive stool guaiac in the ED, lower suspicion for GU etiology given clear urine in nephrostomy bag Has received 2 unit transfusion so far Hemoglobin has been stable Plan: Continue Xarelto, restarted 8/23 Change PPI to p.o., 40 twice daily Transfusion threshold 8.0 If hemoglobin stable anticipate discharge home 8/25    Hypotension Secondary to hypovolemia from ongoing blood loss, diuretic treatment Hold antihypertensives   Diabetes mellitus without complication (HCC) Sliding scale insulin coverage   Chronic diastolic CHF (congestive heart failure) (HCC) Compensated Hold Lasix due to hypotension   Endometrial cancer [facility]) Oncology follow up   Staphylococcus epidermidis bacteremia, ruled out 2/2 culture have grown out staph epi. Infectious disease has been consulted.  Blood culture taken from peripheral line.  Repeat cultures negative.  Felt to be contaminant.   Antibiotics discontinued.  Monitor vitals and fever curve.  Acute bronchitis Appears resolved   Symptomatic anemia The patient received 2 unit of PRBC's in transfusion. GI has been consulted as the patient has had multiple recent GIB issues. PPI and supportive care recommended.  No plans for endoscopic evaluation   Lymphedema Holding Lasix due to soft blood pressure   History of pulmonary embolus (PE) Xarelto restarted 8/23   Bilateral hydronephrosis s/p left nephrostomy tube June 2023 Chronic radiation cystitis with intermittent hematuria s/p fulguration June 2023 Possible UTI UA with moderate leukocyte esterases but normal WBC and lactic acid and procalcitonin Patient received cefepime and Flagyl in the ED Successful urostomy tube exchange on 8/21 Follow cultures from nephrostomy, no growth to date All antibiotics stopped   Acute on chronic right femoral DVT (deep venous thrombosis) (HCC) Chronic left femoral DVT Chronic PE History of IVC filter Heparin drip will be discontinued.  We will restart Xarelto.  Patient was previously on 10 mg however considering severity of clot we will start a 20 mg daily dosing without load   DVT prophylaxis: Xarelto Code Status: Full Family Communication: daughter Jossie Ng [phone] on 8/23 Disposition Plan: Status is: Inpatient Remains inpatient appropriate because: Acute on chronic blood loss anemia.  Improving.  Anticipate discharge 8/25   Level of care: Med-Surg  Consultants:  Infectious disease  Procedures:  None  Antimicrobials: Vancomycin   Subjective: Seen and examined.  Pale appearing.  No visible distress otherwise.  No pain complaints  Objective: Vitals:   02/27/22 1623 02/27/22 2000 02/28/22 0508 02/28/22 0811  BP: (!) 120/53 (!) 116/51 96/61 100/64  Pulse: 76 70 78 79  Resp: '18 19 18 20  '$ Temp: 98.5 F (36.9 C) 98.2 F (36.8 C) 98 F (36.7 C) 98.5 F (36.9 C)  TempSrc: Oral Oral Oral Oral  SpO2: 96% 97%  96% 96%  Weight:      Height:        Intake/Output Summary (Last 24 hours) at 02/28/2022 1221 Last data filed at 02/28/2022 1100 Gross per 24 hour  Intake --  Output 2825 ml  Net -2825 ml   Filed Weights   02/23/22 2310  Weight: 79.4 kg    Examination:  General exam: NAD.  Pale appearing Respiratory system: Clear to auscultation. Respiratory effort normal. Cardiovascular system: S1-S2, RRR, no murmurs, no pedal edema Gastrointestinal system: Soft, NT/ND, normal bowel sounds Central nervous system: Alert and oriented. No focal neurological deficits. Extremities: Symmetric 5 x 5 power. Skin: No rashes, lesions or ulcers Psychiatry: Judgement and insight appear normal. Mood & affect appropriate.     Data Reviewed: I have personally reviewed following labs and imaging studies  CBC: Recent Labs  Lab 02/23/22 2327 02/24/22 0907 02/25/22 0414 02/26/22 0508 02/27/22 0503 02/28/22 0602  WBC 5.3 4.6 5.1 4.6 4.6 4.2  NEUTROABS 3.5  --  3.5  --   --   --   HGB 6.8* 7.2* 8.3* 8.5* 8.3* 8.2*  HCT 23.1* 23.6* 26.7* 27.6* 26.9* 26.7*  MCV 85.9 84.0 84.2 84.7 83.8 84.2  PLT 288 240 225 219 217 321   Basic Metabolic Panel: Recent Labs  Lab 02/23/22 2327 02/24/22 0201 02/25/22 0414 02/26/22 0508 02/27/22 0503  NA 139 140 139 140  --   K 3.3* 3.2* 4.1 3.9  --   CL 105 109 110 110  --   CO2 '27 23 24 25  '$ --   GLUCOSE 152* 103* 95 96  --   BUN '18 17 18 15  '$ --  CREATININE 0.89 0.82 0.84 0.74 0.72  CALCIUM 8.6* 7.9* 8.2* 8.4*  --    GFR: Estimated Creatinine Clearance: 62.2 mL/min (by C-G formula based on SCr of 0.72 mg/dL). Liver Function Tests: Recent Labs  Lab 02/23/22 2327  AST 22  ALT 11  ALKPHOS 41  BILITOT 0.7  PROT 6.7  ALBUMIN 3.2*   No results for input(s): "LIPASE", "AMYLASE" in the last 168 hours. No results for input(s): "AMMONIA" in the last 168 hours. Coagulation Profile: Recent Labs  Lab 02/25/22 1738  INR 1.1   Cardiac Enzymes: No results  for input(s): "CKTOTAL", "CKMB", "CKMBINDEX", "TROPONINI" in the last 168 hours. BNP (last 3 results) No results for input(s): "PROBNP" in the last 8760 hours. HbA1C: No results for input(s): "HGBA1C" in the last 72 hours. CBG: No results for input(s): "GLUCAP" in the last 168 hours. Lipid Profile: No results for input(s): "CHOL", "HDL", "LDLCALC", "TRIG", "CHOLHDL", "LDLDIRECT" in the last 72 hours. Thyroid Function Tests: No results for input(s): "TSH", "T4TOTAL", "FREET4", "T3FREE", "THYROIDAB" in the last 72 hours. Anemia Panel: No results for input(s): "VITAMINB12", "FOLATE", "FERRITIN", "TIBC", "IRON", "RETICCTPCT" in the last 72 hours. Sepsis Labs: Recent Labs  Lab 02/23/22 2358 02/24/22 0234  PROCALCITON <0.10  --   LATICACIDVEN  --  0.7    Recent Results (from the past 240 hour(s))  SARS Coronavirus 2 by RT PCR (hospital order, performed in [facility] hospital lab) *cepheid single result test* Anterior Nasal Swab     Status: None   Collection Time: 02/24/22 12:38 AM   Specimen: Anterior Nasal Swab  Result Value Ref Range Status   SARS Coronavirus 2 by RT PCR NEGATIVE NEGATIVE Final    Comment: (NOTE) SARS-CoV-2 target nucleic acids are NOT DETECTED.  The SARS-CoV-2 RNA is generally detectable in upper and lower respiratory specimens during the acute phase of infection. The lowest concentration of SARS-CoV-2 viral copies this assay can detect is 250 copies / mL. A negative result does not preclude SARS-CoV-2 infection and should not be used as the sole basis for treatment or other patient management decisions.  A negative result may occur with improper specimen collection / handling, submission of specimen other than nasopharyngeal swab, presence of viral mutation(s) within the areas targeted by this assay, and inadequate number of viral copies (<250 copies / mL). A negative result must be combined with clinical observations, patient history, and epidemiological  information.  Fact Sheet for Patients:   https://www.patel.info/  Fact Sheet for Healthcare Providers: https://hall.com/  This test is not yet approved or  cleared by the [place] FDA and has been authorized for detection and/or diagnosis of SARS-CoV-2 by FDA under an Emergency Use Authorization (EUA).  This EUA will remain in effect (meaning this test can be used) for the duration of the COVID-19 declaration under Section 564(b)(1) of the Act, 21 U.S.C. section 360bbb-3(b)(1), unless the authorization is terminated or revoked sooner.  Performed at [facility], [address] [place], [place] [address]   Urine Culture     Status: Abnormal   Collection Time: 02/24/22  2:01 AM   Specimen: Urine, Clean Catch  Result Value Ref Range Status   Specimen Description   Final    URINE, CLEAN CATCH Performed at [facility], [address], [place], [place] [address]    Special Requests   Final    NONE Performed at [facility], [address], [place], [place] [address]    Culture MULTIPLE SPECIES PRESENT, SUGGEST  RECOLLECTION (A)  Final   Report Status 02/25/2022 FINAL  Final  Blood culture (routine x 2)     Status: Abnormal   Collection Time: 02/24/22  2:35 AM   Specimen: BLOOD  Result Value Ref Range Status   Specimen Description   Final    BLOOD LEFT FA Performed at [facility], [address] [place], [place] [address]    Special Requests   Final    BOTTLES DRAWN AEROBIC AND ANAEROBIC Blood Culture results may not be optimal due to an inadequate volume of blood received in culture bottles Performed at [facility], [address] [place], [place] [address]    Culture  Setup Time   Final    GRAM POSITIVE COCCI IN BOTH AEROBIC AND ANAEROBIC BOTTLES CRITICAL RESULT CALLED TO, READ BACK BY AND VERIFIED WITH: JASON ROBBINS 02/25/22 0051 DE    Culture (A)  Final     STAPHYLOCOCCUS EPIDERMIDIS SUSCEPTIBILITIES PERFORMED ON PREVIOUS CULTURE WITHIN THE LAST 5 DAYS. Performed at [facility], [place] [address] [place], [place] [address]    Report Status 02/28/2022 FINAL  Final  Blood culture (routine x 2)     Status: Abnormal   Collection Time: 02/24/22  2:35 AM   Specimen: BLOOD  Result Value Ref Range Status   Specimen Description   Final    BLOOD LEFT UPPER ARM Performed at [facility], [address], [place], [place] [address]    Special Requests   Final    BOTTLES DRAWN AEROBIC AND ANAEROBIC Blood Culture adequate volume Performed at [facility], [address] [place], [place] [address]    Culture  Setup Time   Final    Organism ID to follow Lucky CRITICAL RESULT CALLED TO, READ BACK BY AND VERIFIED WITH: JASON ROBBINS 02/24/22 2156 DE    Culture (A)  Final    STAPHYLOCOCCUS HOMINIS THE SIGNIFICANCE OF ISOLATING THIS ORGANISM FROM A SINGLE SET OF BLOOD CULTURES WHEN MULTIPLE SETS ARE DRAWN IS UNCERTAIN. PLEASE NOTIFY THE MICROBIOLOGY DEPARTMENT WITHIN ONE WEEK IF SPECIATION AND SENSITIVITIES ARE REQUIRED. STAPHYLOCOCCUS EPIDERMIDIS    Report Status 02/28/2022 FINAL  Final   Organism ID, Bacteria STAPHYLOCOCCUS EPIDERMIDIS  Final      Susceptibility   Staphylococcus epidermidis - MIC*    CIPROFLOXACIN >=8 RESISTANT Resistant     ERYTHROMYCIN >=8 RESISTANT Resistant     GENTAMICIN 8 INTERMEDIATE Intermediate     OXACILLIN >=4 RESISTANT Resistant     TETRACYCLINE >=16 RESISTANT Resistant     VANCOMYCIN 1 SENSITIVE Sensitive     TRIMETH/SULFA 80 RESISTANT Resistant     CLINDAMYCIN >=8 RESISTANT Resistant     RIFAMPIN <=0.5 SENSITIVE Sensitive     Inducible Clindamycin NEGATIVE Sensitive     * STAPHYLOCOCCUS EPIDERMIDIS  Blood Culture ID Panel (Reflexed)     Status: Abnormal   Collection Time: 02/24/22  2:35 AM  Result Value Ref Range Status   Enterococcus faecalis NOT  DETECTED NOT DETECTED Final   Enterococcus Faecium NOT DETECTED NOT DETECTED Final   Listeria monocytogenes NOT DETECTED NOT DETECTED Final   Staphylococcus species DETECTED (A) NOT DETECTED Final    Comment: CRITICAL RESULT CALLED TO, READ BACK BY AND VERIFIED WITH: JASON ROBBINS PHARMD AT 2156 02/24/2022 DE    Staphylococcus aureus (BCID) NOT DETECTED NOT DETECTED Final   Staphylococcus epidermidis DETECTED (A) NOT DETECTED Final    Comment: Methicillin (oxacillin) resistant coagulase negative staphylococcus. Possible  blood culture contaminant (unless isolated from more than one blood culture draw or clinical case suggests pathogenicity). No antibiotic treatment is indicated for blood  culture contaminants. CRITICAL RESULT CALLED TO, READ BACK BY AND VERIFIED WITH: Violeta Gelinas PHARMD AT 2156 02/24/2022 DE    Staphylococcus lugdunensis NOT DETECTED NOT DETECTED Final   Streptococcus species NOT DETECTED NOT DETECTED Final   Streptococcus agalactiae NOT DETECTED NOT DETECTED Final   Streptococcus pneumoniae NOT DETECTED NOT DETECTED Final   Streptococcus pyogenes NOT DETECTED NOT DETECTED Final   A.calcoaceticus-baumannii NOT DETECTED NOT DETECTED Final   Bacteroides fragilis NOT DETECTED NOT DETECTED Final   Enterobacterales NOT DETECTED NOT DETECTED Final   Enterobacter cloacae complex NOT DETECTED NOT DETECTED Final   Escherichia coli NOT DETECTED NOT DETECTED Final   Klebsiella aerogenes NOT DETECTED NOT DETECTED Final   Klebsiella oxytoca NOT DETECTED NOT DETECTED Final   Klebsiella pneumoniae NOT DETECTED NOT DETECTED Final   Proteus species NOT DETECTED NOT DETECTED Final   Salmonella species NOT DETECTED NOT DETECTED Final   Serratia marcescens NOT DETECTED NOT DETECTED Final   Haemophilus influenzae NOT DETECTED NOT DETECTED Final   Neisseria meningitidis NOT DETECTED NOT DETECTED Final   Pseudomonas aeruginosa NOT DETECTED NOT DETECTED Final   Stenotrophomonas maltophilia  NOT DETECTED NOT DETECTED Final   Candida albicans NOT DETECTED NOT DETECTED Final   Candida auris NOT DETECTED NOT DETECTED Final   Candida glabrata NOT DETECTED NOT DETECTED Final   Candida krusei NOT DETECTED NOT DETECTED Final   Candida parapsilosis NOT DETECTED NOT DETECTED Final   Candida tropicalis NOT DETECTED NOT DETECTED Final   Cryptococcus neoformans/gattii NOT DETECTED NOT DETECTED Final   Methicillin resistance mecA/C DETECTED (A) NOT DETECTED Final    Comment: CRITICAL RESULT CALLED TO, READ BACK BY AND VERIFIED WITHVioleta Gelinas PHARMD AT 2156 02/24/2022 DE Performed at [facility], [address], [place], [place] [address]   Urine Culture     Status: Abnormal   Collection Time: 02/25/22  3:05 PM   Specimen: Urine, Random  Result Value Ref Range Status   Specimen Description   Final    URINE, RANDOM Performed at [facility], [address] [place], [place] [address]    Special Requests   Final    NONE Performed at [facility], [address] [place], [place] [address]    Culture MULTIPLE SPECIES PRESENT, SUGGEST RECOLLECTION (A)  Final   Report Status 02/26/2022 FINAL  Final  Culture, blood (Routine X 2) w Reflex to ID Panel     Status: None (Preliminary result)   Collection Time: 02/25/22  4:28 PM   Specimen: BLOOD RIGHT HAND  Result Value Ref Range Status   Specimen Description BLOOD RIGHT HAND  Final   Special Requests   Final    BOTTLES DRAWN AEROBIC AND ANAEROBIC Blood Culture adequate volume   Culture   Final    NO GROWTH 3 DAYS Performed at [facility], [address] [place], [place] [address]    Report Status PENDING  Incomplete  Culture, blood (Routine X 2) w Reflex to ID Panel     Status: None (Preliminary result)   Collection Time: 02/25/22  4:38 PM   Specimen: BLOOD LEFT HAND  Result Value Ref Range Status   Specimen Description BLOOD LEFT HAND  Final   Special Requests   Final    BOTTLES DRAWN  AEROBIC AND ANAEROBIC Blood Culture results may not be optimal due  to an excessive volume of blood received in culture bottles   Culture   Final    NO GROWTH 3 DAYS Performed at [facility], [address] [place], [place] [address]    Report Status PENDING  Incomplete  Respiratory (~20 pathogens) panel by PCR     Status: None   Collection Time: 02/25/22  5:10 PM   Specimen: Nasopharyngeal Swab; Respiratory  Result Value Ref Range Status   Adenovirus NOT DETECTED NOT DETECTED Final   Coronavirus 229E NOT DETECTED NOT DETECTED Final    Comment: (NOTE) The Coronavirus on the Respiratory Panel, DOES NOT test for the novel  Coronavirus (2019 nCoV)    Coronavirus HKU1 NOT DETECTED NOT DETECTED Final   Coronavirus NL63 NOT DETECTED NOT DETECTED Final   Coronavirus OC43 NOT DETECTED NOT DETECTED Final   Metapneumovirus NOT DETECTED NOT DETECTED Final   Rhinovirus / Enterovirus NOT DETECTED NOT DETECTED Final   Influenza A NOT DETECTED NOT DETECTED Final   Influenza B NOT DETECTED NOT DETECTED Final   Parainfluenza Virus 1 NOT DETECTED NOT DETECTED Final   Parainfluenza Virus 2 NOT DETECTED NOT DETECTED Final   Parainfluenza Virus 3 NOT DETECTED NOT DETECTED Final   Parainfluenza Virus 4 NOT DETECTED NOT DETECTED Final   Respiratory Syncytial Virus NOT DETECTED NOT DETECTED Final   Bordetella pertussis NOT DETECTED NOT DETECTED Final   Bordetella Parapertussis NOT DETECTED NOT DETECTED Final   Chlamydophila pneumoniae NOT DETECTED NOT DETECTED Final   Mycoplasma pneumoniae NOT DETECTED NOT DETECTED Final    Comment: Performed at [facility], [place]. [address] [place], [place] [address]         Radiology Studies: No results found.      Scheduled Meds:  sodium chloride   Intravenous Once   cyanocobalamin  1,000 mcg Oral Daily   furosemide  20 mg Oral Daily   mometasone-formoterol  2 puff Inhalation BID   pantoprazole  40 mg Oral [id number]   rivaroxaban  20 mg  Oral Daily   rosuvastatin  10 mg Oral Daily   Continuous Infusions:  sodium chloride 10 mL/hr at 02/25/22 0349     LOS: 4 days      Sidney Ace, MD [facility]   If 7PM-7AM, please contact night-coverage  02/28/2022, 12:21 PM

## 2022-02-28 NOTE — Progress Notes (Signed)
Mobility Specialist - Progress Note    02/28/22 1415  Mobility  Activity Ambulated with assistance in hallway  Level of Assistance Standby assist, set-up cues, supervision of patient - no hands on  Assistive Device Front wheel walker  Distance Ambulated (ft) 40 ft  Activity Response Tolerated well  $Mobility charge 1 Mobility   Pt sitting at EOB upon entry, utilizing RA. Pt ambulated 20 ft in the hallway using RW SBA. Pt left supine with needs in reach. NT enter upon exit. No complaints.   Courtney Mack Mobility Specialist 02/28/22 2:23 PM

## 2022-03-01 ENCOUNTER — Other Ambulatory Visit: Payer: Self-pay | Admitting: Interventional Radiology

## 2022-03-01 DIAGNOSIS — D500 Iron deficiency anemia secondary to blood loss (chronic): Secondary | ICD-10-CM | POA: Diagnosis not present

## 2022-03-01 DIAGNOSIS — N1330 Unspecified hydronephrosis: Secondary | ICD-10-CM

## 2022-03-01 DIAGNOSIS — Z86711 Personal history of pulmonary embolism: Secondary | ICD-10-CM | POA: Diagnosis not present

## 2022-03-01 DIAGNOSIS — D649 Anemia, unspecified: Secondary | ICD-10-CM | POA: Diagnosis not present

## 2022-03-01 DIAGNOSIS — I82412 Acute embolism and thrombosis of left femoral vein: Secondary | ICD-10-CM | POA: Diagnosis not present

## 2022-03-01 LAB — CBC WITH DIFFERENTIAL/PLATELET
Abs Immature Granulocytes: 0.01 10*3/uL (ref 0.00–0.07)
Basophils Absolute: 0 10*3/uL (ref 0.0–0.1)
Basophils Relative: 0 %
Eosinophils Absolute: 0.6 10*3/uL — ABNORMAL HIGH (ref 0.0–0.5)
Eosinophils Relative: 11 %
HCT: 28.5 % — ABNORMAL LOW (ref 36.0–46.0)
Hemoglobin: 9.1 g/dL — ABNORMAL LOW (ref 12.0–15.0)
Immature Granulocytes: 0 %
Lymphocytes Relative: 16 %
Lymphs Abs: 0.8 10*3/uL (ref 0.7–4.0)
MCH: 26.5 pg (ref 26.0–34.0)
MCHC: 31.9 g/dL (ref 30.0–36.0)
MCV: 83.1 fL (ref 80.0–100.0)
Monocytes Absolute: 0.4 10*3/uL (ref 0.1–1.0)
Monocytes Relative: 7 %
Neutro Abs: 3.5 10*3/uL (ref 1.7–7.7)
Neutrophils Relative %: 66 %
Platelets: 257 10*3/uL (ref 150–400)
RBC: 3.43 MIL/uL — ABNORMAL LOW (ref 3.87–5.11)
RDW: 18.1 % — ABNORMAL HIGH (ref 11.5–15.5)
WBC: 5.3 10*3/uL (ref 4.0–10.5)
nRBC: 0 % (ref 0.0–0.2)

## 2022-03-01 MED ORDER — PANTOPRAZOLE SODIUM 40 MG PO TBEC
40.0000 mg | DELAYED_RELEASE_TABLET | Freq: Two times a day (BID) | ORAL | 1 refills | Status: DC
Start: 2022-03-01 — End: 2023-02-12

## 2022-03-01 MED ORDER — RIVAROXABAN 20 MG PO TABS: 20.0000 mg | ORAL_TABLET | Freq: Every day | ORAL | 2 refills | Status: DC

## 2022-03-01 NOTE — Care Management Important Message (Signed)
Important Message  Patient Details  Name: Courtney Mack MRN: [id number] Date of Birth: August 20, 1955   Medicare Important Message Given:  Yes     [name] 03/01/2022, 10:46 AM

## 2022-03-01 NOTE — Discharge Summary (Signed)
Physician Discharge Summary  Courtney Mack DDU:[id number] DOB: September 07, 1959 DOA: 02/23/2022  PCP: [facility] [place] date: 02/23/2022 Discharge date: 03/01/2022  Admitted From: Home Disposition: Home  Recommendations for Outpatient Follow-up:  Follow up with PCP in 1-2 weeks Follow-up with urology as directed  Home Health: No Equipment/Devices: Urostomy tube  Discharge Condition: Stable CODE STATUS: Full Diet recommendation: Regular  Brief/Interim Summary: 66 yr old woman who presented to [facility] ED on 02/23/2022 with complaints of lightheadedness, headache, shortness of breath and worsening of her chronic lower extremity edema. She was found to have a low blood pressure with systolics in the 23-76 range. Her hemoglobin was 6.8, potassium was 3.3, and she was guaiac positive.    Lower extremity doppler demonstrated acute on chronic left common femoral vein DVT and a chronic partially occlusive thrombus right common femoral vein and proximal superficial femoral vein.   CTA chest and abdomen demonstrated no acute pulmonary embolus. There was stable minimal chronic pulmonary embolus in right lower lobar pulmonary artery. There was evidence for extensive bronchitis and airway impaction in the lower lobes bilaterally with endobronchial debris suggesting superimposed infection or aspiration. There is also evidence for small airway disease. There is a left percutaneous nephrostomy catheter in expected position. There is no hydronephrosis. There was suggestion of cystitis with marked circumferential stranding. 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 This may be related to progressive subcutaneous body wall edema. There is also peripheral vascular disease with hemodynamically significantly stenosis of the celiac axis and superior mesenteric artery proximally. Suggestion is to  correlate clinically for signs and symptoms of chronic mesenteric ischemia. Suggestion is also for CT arteriography.   Patient has been started on heparin drip for chronic DVT.  Gastroenterology without plans for endoscopic evaluation.  Blood cultures have now grown out 2/2 positive for Staphylococcus epidermidis.  Patient has been on IV antibiotics.  Infectious disease on board.   Successful nephrostomy tube exchange on 8/21 Repeat blood cultures negative.  Infectious disease suspects that blood culture with Staphylococcus epidermidis was likely a contaminant.  Antibiotics have been stopped.  Xarelto restarted 8/24 and patient monitored for the next 24 hours.  Hemoglobin 9.1, increased on the day of discharge.  No evidence of bleeding.  I have advised patient to resume her home Xarelto.  Patient has acute on chronic bilateral lower extremity DVTs.  She was previously on Xarelto 10 mg a day, likely a subtherapeutic dose.  Patient had AVS instructions to hold her Xarelto until seen by urology in clinic.  I do not feel this is safe for the patient.  Considering she had successful urostomy tube exchange without evidence of GU blood loss I recommend the patient resume her home Xarelto at time of DC.  Xarelto dose has been increased to 20 mg daily   Discharge Diagnoses:  Active Problems:   Symptomatic anemia due to chronic blood loss   Hypotension   Gastric antral vascular ectasia   Diabetes mellitus without complication (HCC)   Chronic diastolic CHF (congestive heart failure) (HCC)   Endometrial cancer (HCC)   Acute on chronic right femoral DVT (deep venous thrombosis) (HCC)   Bilateral hydronephrosis s/p left nephrostomy tube June 2023   Chronic radiation cystitis   History of pulmonary embolus (PE)   Lymphedema   Symptomatic anemia   Acute bronchitis   Staphylococcus epidermidis bacteremia  Symptomatic anemia due to  chronic blood loss History of upper GI bleed 7/25/EGD with GAVE s/p  APC History of radiation cystitis with intermittent hematuria Chronic anticoagulation Etiology suspect GI given positive stool guaiac in the ED, lower suspicion for GU etiology given clear urine in nephrostomy bag Has received 2 unit transfusion so far Hemoglobin has been stable Plan: Continue Xarelto, restarted 8/23 Dose increased to 20 mg daily Twice daily PPI Urology AVS indicates holding Xarelto until 9/8.  I do not feel this is safe or appropriate for the patient.  Considering low suspicion for GU blood loss I recommended the patient resume her Xarelto at time of discharge    Staphylococcus epidermidis bacteremia, ruled out 2/2 culture have grown out staph epi. Infectious disease has been consulted.  Blood culture taken from peripheral line.  Repeat cultures negative.  Felt to be contaminant.  Antibiotics discontinued.  Monitor vitals and fever curve.   Acute bronchitis Appears resolved   Symptomatic anemia The patient received 2 unit of PRBC's in transfusion. GI has been consulted as the patient has had multiple recent GIB issues. PPI and supportive care recommended.  No plans for endoscopic evaluation    History of pulmonary embolus (PE) Xarelto restarted 8/23   Bilateral hydronephrosis s/p left nephrostomy tube June 2023 Chronic radiation cystitis with intermittent hematuria s/p fulguration June 2023 Possible UTI UA with moderate leukocyte esterases but normal WBC and lactic acid and procalcitonin Patient received cefepime and Flagyl in the ED Successful urostomy tube exchange on 8/21 Follow cultures from nephrostomy, no growth to date All antibiotics stopped   Acute on chronic right femoral DVT (deep venous thrombosis) (HCC) Chronic left femoral DVT Chronic PE History of IVC filter Heparin drip will be discontinued.  We will restart Xarelto.  Patient was previously on 10 mg however considering severity of clot we will start a 20 mg daily dosing without load.  Urology AVS  indicates holding Xarelto until seen as outpatient.  This is not appropriate for the patient at this time.  Recommend to resume home Xarelto dose of 20 mg daily.  Discharge Instructions  Discharge Instructions     Diet - low sodium heart healthy   Complete by: As directed    Increase activity slowly   Complete by: As directed       Allergies as of 03/01/2022   No Known Allergies      Medication List     TAKE these medications    acetaminophen 500 MG tablet Commonly known as: TYLENOL Take 500 mg by mouth every 6 (six) hours as needed for mild pain or fever.   Breo Ellipta 100-25 MCG/ACT Aepb Generic drug: fluticasone furoate-vilanterol Inhale 1 puff into the lungs daily.   cyanocobalamin 1000 MCG tablet Take 1 tablet (1,000 mcg total) by mouth daily.   Dulera 100-5 MCG/ACT Aero Generic drug: mometasone-formoterol Inhale 2 puffs into the lungs 2 (two) times daily.   feeding supplement Liqd Take 237 mLs by mouth 3 (three) times daily between meals.   furosemide 20 MG tablet Commonly known as: Lasix Hold due to low blood pressure. What changed:  how much to take how to take this when to take this additional instructions   loperamide 2 MG capsule Commonly known as: IMODIUM Take 1 capsule (2 mg total) by mouth as needed for diarrhea or loose stools.   multivitamin with minerals Tabs tablet Take 1 tablet by mouth daily.   pantoprazole 40 MG tablet Commonly known as: PROTONIX Take 1 tablet (40 mg total) by  mouth 2 (two) times daily. What changed: when to take this   rivaroxaban 20 MG Tabs tablet Commonly known as: XARELTO Take 1 tablet (20 mg total) by mouth daily. Hold until followup with urology. What changed:  medication strength how much to take how to take this when to take this   rosuvastatin 10 MG tablet Commonly known as: CRESTOR Take 1 tablet (10 mg total) by mouth daily. What changed: when to take this   vitamin C 1000 MG tablet Take 1,000  mg by mouth daily.        No Known Allergies  Consultations: IR ID   Procedures/Studies: ECHOCARDIOGRAM COMPLETE  Result Date: 02/25/2022    ECHOCARDIOGRAM REPORT   Patient Name:   AREYA LEMMERMAN Date of Exam: 02/25/2022 Medical Rec #:  [id number]          Height:       59.0 in Accession #:    [id number]         Weight:       175.0 lb Date of Birth:  February 12, 1956          BSA:          1.743 m Patient Age:    66 years           BP:           113/61 mmHg Patient Gender: F                  HR:           73 bpm. Exam Location:  [facility] Procedure: 2D Echo, Cardiac Doppler and Color Doppler Indications:     R78.81 Bacteremia  History:         Patient has prior history of Echocardiogram examinations, most                  recent 09/12/2021. CHF; Risk Factors:Diabetes, Dyslipidemia and                  Hypertension.  Sonographer:     Rosalia Hammers Referring Phys:  [id number] Karie Kirks Diagnosing Phys: Yolonda Kida MD  Sonographer Comments: Image acquisition challenging due to patient body habitus. IMPRESSIONS  1. Left ventricular ejection fraction, by estimation, is 65 to 70%. The left ventricle has normal function. The left ventricle has no regional wall motion abnormalities. Left ventricular diastolic parameters are consistent with Grade I diastolic dysfunction (impaired relaxation).  2. Right ventricular systolic function is normal. The right ventricular size is normal.  3. The mitral valve is normal in structure. No evidence of mitral valve regurgitation.  4. The aortic valve is normal in structure. Aortic valve regurgitation is not visualized. FINDINGS  Left Ventricle: Left ventricular ejection fraction, by estimation, is 65 to 70%. The left ventricle has normal function. The left ventricle has no regional wall motion abnormalities. The left ventricular internal cavity size was normal in size. There is  no left ventricular hypertrophy. Left ventricular diastolic parameters are consistent with Grade I  diastolic dysfunction (impaired relaxation). Right Ventricle: The right ventricular size is normal. No increase in right ventricular wall thickness. Right ventricular systolic function is normal. Left Atrium: Left atrial size was normal in size. Right Atrium: Right atrial size was normal in size. Pericardium: There is no evidence of pericardial effusion. Mitral Valve: The mitral valve is normal in structure. No evidence of mitral valve regurgitation. Tricuspid Valve: The tricuspid valve is normal in structure. Tricuspid valve regurgitation is mild. Aortic  Valve: The aortic valve is normal in structure. Aortic valve regurgitation is not visualized. Aortic valve mean gradient measures 7.0 mmHg. Aortic valve peak gradient measures 11.8 mmHg. Aortic valve area, by VTI measures 1.99 cm. Pulmonic Valve: The pulmonic valve was normal in structure. Pulmonic valve regurgitation is not visualized. Aorta: The ascending aorta was not well visualized. IAS/Shunts: No atrial level shunt detected by color flow Doppler.  LEFT VENTRICLE PLAX 2D LVIDd:         3.09 cm   Diastology LVIDs:         2.01 cm   LV e' medial:    7.29 cm/s LV PW:         1.34 cm   LV E/e' medial:  13.9 LV IVS:        0.95 cm   LV e' lateral:   8.27 cm/s LVOT diam:     1.80 cm   LV E/e' lateral: 12.2 LV SV:         74 LV SV Index:   43 LVOT Area:     2.54 cm  RIGHT VENTRICLE RV Basal diam:  2.93 cm RV S prime:     16.50 cm/s TAPSE (M-mode): 2.4 cm LEFT ATRIUM             Index        RIGHT ATRIUM           Index LA diam:        3.10 cm 1.78 cm/m   RA Area:     12.40 cm LA Vol (A2C):   45.2 ml 25.94 ml/m  RA Volume:   23.70 ml  13.60 ml/m LA Vol (A4C):   29.3 ml 16.81 ml/m LA Biplane Vol: 38.5 ml 22.09 ml/m  AORTIC VALVE AV Area (Vmax):    2.00 cm AV Area (Vmean):   1.78 cm AV Area (VTI):     1.99 cm AV Vmax:           172.00 cm/s AV Vmean:          127.000 cm/s AV VTI:            0.373 m AV Peak Grad:      11.8 mmHg AV Mean Grad:      7.0 mmHg LVOT  Vmax:         135.00 cm/s LVOT Vmean:        88.800 cm/s LVOT VTI:          0.292 m LVOT/AV VTI ratio: 0.78  AORTA Ao Root diam: 3.60 cm MITRAL VALVE MV Area (PHT): 3.58 cm     SHUNTS MV Decel Time: 212 msec     Systemic VTI:  0.29 m MV E velocity: 101.00 cm/s  Systemic Diam: 1.80 cm MV A velocity: 127.00 cm/s MV E/A ratio:  0.80 Dwayne D Callwood MD Electronically signed by Yolonda Kida MD Signature Date/Time: 02/25/2022/5:27:16 PM    Final    IR NEPHROSTOMY EXCHANGE LEFT  Result Date: 02/25/2022 INDICATION: 66 year old woman with left obstructive uropathy presents to IR for routine exchange of nephrostomy drain. EXAM: Fluoroscopy guided left nephrostomy drain exchange COMPARISON:  None Available. MEDICATIONS: None ANESTHESIA/SEDATION: None CONTRAST:  4 mL of Omnipaque 350-administered into the collecting system(s) FLUOROSCOPY TIME:  Radiation Exposure Index (as provided by the fluoroscopic device): 4.2 mGy Kerma COMPLICATIONS: None immediate. PROCEDURE: Informed written consent was obtained from the patient after a thorough discussion of the procedural risks, benefits and alternatives. All questions were addressed. Maximal  Sterile Barrier Technique was utilized including caps, mask, sterile gowns, sterile gloves, sterile drape, hand hygiene and skin antiseptic. A timeout was performed prior to the initiation of the procedure. Patient position prone on the procedure table. The external segment of the existing 10.2 [place] multipurpose pigtail drain and surrounding skin prepped and draped usual fashion. Contrast administered through the drain under fluoroscopy confirmed appropriate positioning within the renal pelvis. The drain was cut and removed over 0.035 inch guidewire and replaced with an identical 10.2 [place] multipurpose pigtail drain. Contrast administered through the new drain under fluoroscopy showed appropriate positioning within the renal pelvis. Drain secured to skin with suture and connected to  bag. IMPRESSION: Routine exchange of left percutaneous for ostomy drain with new 10.2 French catheter in place. PLAN: Return in 8 weeks for routine exchange. Electronically Signed   By: Miachel Roux M.D.   On: 02/25/2022 15:10   CT Angio Chest PE W and/or Wo Contrast  Result Date: 02/24/2022 CLINICAL DATA:  Pulmonary embolism (PE) suspected, positive D-dimer; Abdominal pain, acute, nonlocalized back pain, nephromstomy tube on Left EXAM: CT ANGIOGRAPHY CHEST CT ABDOMEN AND PELVIS WITH CONTRAST TECHNIQUE: Multidetector CT imaging of the chest was performed using the standard protocol during bolus administration of intravenous contrast. Multiplanar CT image reconstructions and MIPs were obtained to evaluate the vascular anatomy. Multidetector CT imaging of the abdomen and pelvis was performed using the standard protocol during bolus administration of intravenous contrast. RADIATION DOSE REDUCTION: This exam was performed according to the departmental dose-optimization program which includes automated exposure control, adjustment of the mA and/or kV according to patient size and/or use of iterative reconstruction technique. CONTRAST:  75m OMNIPAQUE IOHEXOL 350 MG/ML SOLN COMPARISON:  11/17/2021 FINDINGS: CTA CHEST FINDINGS Cardiovascular: There is adequate opacification of the pulmonary arterial tree. Tiny web identified within the a right lower lobar pulmonary artery in keeping with chronic pulmonary embolus, stable since prior examination. No acute pulmonary embolus identified. Central pulmonary arteries are of normal caliber. Minimal coronary artery calcification. Global cardiac size within normal limits. No pericardial effusion. Mild atherosclerotic calcification within the thoracic aorta. No aortic aneurysm. Mediastinum/Nodes: No enlarged mediastinal, hilar, or axillary lymph nodes. Thyroid gland, trachea, and esophagus demonstrate no significant findings. Lungs/Pleura: Subpleural architectural distortion and  interstitial thickening within the a anterior left upper lobe is stable since prior examination in keeping with focal fibrosis. There is diffuse progressive bronchial wall thickening and new extensive airway impaction within the lower lobes bilaterally in keeping with airway inflammation. Extensive endobronchial debris can be seen in the setting of superimposed acute infection or aspiration. Mosaic attenuation of the pulmonary parenchyma is in keeping with multifocal air trapping likely related to small airways disease. No pneumothorax or pleural effusion. Musculoskeletal: No acute bone abnormality. Osseous structures are age-appropriate. Review of the MIP images confirms the above findings. CT ABDOMEN and PELVIS FINDINGS Hepatobiliary: No focal liver abnormality is seen. No gallstones, gallbladder wall thickening, or biliary dilatation. Pancreas: Unremarkable Spleen: Unremarkable Adrenals/Urinary Tract: The adrenal glands are unremarkable. The kidneys are normal in size and position. Left percutaneous nephrostomy catheter has been placed and is in expected position with its retaining loop formed within the left renal pelvis. Interval removal of left double-J ureteral stent. No hydronephrosis. 3 mm nonobstructing calculus is noted within the lower pole of the right kidney. There is persistent marked circumferential perivesicular inflammatory stranding suggesting an underlying infectious or inflammatory cystitis. The bladder is decompressed. Stomach/Bowel: Stomach is within normal limits. Appendix appears normal. No evidence  of bowel wall thickening, distention, or inflammatory changes. Vascular/Lymphatic: Mild aortoiliac atherosclerotic calcification. Prominent atherosclerotic calcification noted at the origin of the mesenteric and renal vasculature suspected hemodynamically significant stenoses of the celiac axis and superior mesenteric artery proximally. This is not well assessed, however, on this non  arteriographic examination. Infrarenal inferior vena cava filter is unchanged in position, fixed just below the confluence of the retroaortic left renal vein. There is marked narrowing of the common iliac and proximal inferior vena cava suggesting chronic thrombosis, unchanged from prior examination. No pathologic adenopathy within the abdomen and pelvis. Reproductive: Uterus and bilateral adnexa are unremarkable. 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 This does appear slightly progressive since prior examination, however. No abdominal wall hernia Musculoskeletal: Advanced degenerative changes are noted within the hips bilaterally. No acute bone abnormality. No lytic or blastic bone lesion. Review of the MIP images confirms the above findings. IMPRESSION: 1. No acute pulmonary embolus. 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 2. Progressive bronchial wall thickening and extensive airway impaction within the lower lobes bilaterally in keeping with airway inflammation. Extensive endobronchial debris can be seen in the setting of superimposed acute infection or aspiration. 3. Mosaic attenuation of the pulmonary parenchyma in keeping with multifocal air trapping likely related to small airways disease. 4. Minimal coronary artery calcification. 5. Interval removal of left double-J ureteral stent and placement of a left percutaneous nephrostomy catheter in expected position. No hydronephrosis. 6. Minimal nonobstructing right nephrolithiasis. 7. Persistent marked circumferential perivesicular inflammatory stranding suggesting an underlying infectious or inflammatory cystitis. Correlation with urinalysis and urine culture may be helpful. 8. Stable infrarenal inferior vena cava filter with evidence of chronic thrombosis  of the common iliac and proximal inferior vena cava. 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 9. Peripheral vascular disease with suspected hemodynamically significant stenoses of the celiac axis and superior mesenteric artery proximally. Clinical correlation for signs and symptoms of chronic mesenteric ischemia recommended. If indicated, this would be better assessed with CT arteriography. 10.  Aortic Atherosclerosis (ICD10-I70.0). Electronically Signed   By: Fidela Salisbury M.D.   On: 02/24/2022 01:21   CT ABDOMEN PELVIS W CONTRAST  Result Date: 02/24/2022 CLINICAL DATA:  Pulmonary embolism (PE) suspected, positive D-dimer; Abdominal pain, acute, nonlocalized back pain, nephromstomy tube on Left EXAM: CT ANGIOGRAPHY CHEST CT ABDOMEN AND PELVIS WITH CONTRAST TECHNIQUE: Multidetector CT imaging of the chest was performed using the standard protocol during bolus administration of intravenous contrast. Multiplanar CT image reconstructions and MIPs were obtained to evaluate the vascular anatomy. Multidetector CT imaging of the abdomen and pelvis was performed using the standard protocol during bolus administration of intravenous contrast. RADIATION DOSE REDUCTION: This exam was performed according to the departmental dose-optimization program which includes automated exposure control, adjustment of the mA and/or kV according to patient size and/or use of iterative reconstruction technique. CONTRAST:  72m OMNIPAQUE IOHEXOL 350 MG/ML SOLN COMPARISON:  11/17/2021 FINDINGS: CTA CHEST FINDINGS Cardiovascular: There is adequate opacification of the pulmonary arterial tree. Tiny web identified within the a right lower lobar pulmonary artery in keeping with chronic pulmonary embolus, stable since prior examination. No acute pulmonary embolus identified. Central pulmonary arteries are of normal caliber. Minimal coronary artery calcification. Global  cardiac size within normal limits. No pericardial effusion. Mild atherosclerotic calcification  within the thoracic aorta. No aortic aneurysm. Mediastinum/Nodes: No enlarged mediastinal, hilar, or axillary lymph nodes. Thyroid gland, trachea, and esophagus demonstrate no significant findings. Lungs/Pleura: Subpleural architectural distortion and interstitial thickening within the a anterior left upper lobe is stable since prior examination in keeping with focal fibrosis. There is diffuse progressive bronchial wall thickening and new extensive airway impaction within the lower lobes bilaterally in keeping with airway inflammation. Extensive endobronchial debris can be seen in the setting of superimposed acute infection or aspiration. Mosaic attenuation of the pulmonary parenchyma is in keeping with multifocal air trapping likely related to small airways disease. No pneumothorax or pleural effusion. Musculoskeletal: No acute bone abnormality. Osseous structures are age-appropriate. Review of the MIP images confirms the above findings. CT ABDOMEN and PELVIS FINDINGS Hepatobiliary: No focal liver abnormality is seen. No gallstones, gallbladder wall thickening, or biliary dilatation. Pancreas: Unremarkable Spleen: Unremarkable Adrenals/Urinary Tract: The adrenal glands are unremarkable. The kidneys are normal in size and position. Left percutaneous nephrostomy catheter has been placed and is in expected position with its retaining loop formed within the left renal pelvis. Interval removal of left double-J ureteral stent. No hydronephrosis. 3 mm nonobstructing calculus is noted within the lower pole of the right kidney. There is persistent marked circumferential perivesicular inflammatory stranding suggesting an underlying infectious or inflammatory cystitis. The bladder is decompressed. Stomach/Bowel: Stomach is within normal limits. Appendix appears normal. No evidence of bowel wall thickening, distention, or  inflammatory changes. Vascular/Lymphatic: Mild aortoiliac atherosclerotic calcification. Prominent atherosclerotic calcification noted at the origin of the mesenteric and renal vasculature suspected hemodynamically significant stenoses of the celiac axis and superior mesenteric artery proximally. This is not well assessed, however, on this non arteriographic examination. Infrarenal inferior vena cava filter is unchanged in position, fixed just below the confluence of the retroaortic left renal vein. There is marked narrowing of the common iliac and proximal inferior vena cava suggesting chronic thrombosis, unchanged from prior examination. No pathologic adenopathy within the abdomen and pelvis. Reproductive: Uterus and bilateral adnexa are unremarkable. 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 This does appear slightly progressive since prior examination, however. No abdominal wall hernia Musculoskeletal: Advanced degenerative changes are noted within the hips bilaterally. No acute bone abnormality. No lytic or blastic bone lesion. Review of the MIP images confirms the above findings. IMPRESSION: 1. No acute pulmonary embolus. 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 2. Progressive bronchial wall thickening and extensive airway impaction within the lower lobes bilaterally in keeping with airway inflammation. Extensive endobronchial debris can be seen in the setting of superimposed acute infection or aspiration. 3. Mosaic attenuation of the pulmonary parenchyma in keeping with multifocal air trapping likely related to small airways disease. 4. Minimal coronary artery calcification. 5. Interval removal of left double-J ureteral stent and placement of a left percutaneous nephrostomy catheter in expected position. No  hydronephrosis. 6. Minimal nonobstructing right nephrolithiasis. 7. Persistent marked circumferential perivesicular inflammatory stranding suggesting an underlying infectious or inflammatory cystitis. Correlation with urinalysis and urine culture may be helpful. 8. 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 9. Peripheral vascular disease with suspected hemodynamically significant stenoses of the celiac axis and superior mesenteric artery  proximally. Clinical correlation for signs and symptoms of chronic mesenteric ischemia recommended. If indicated, this would be better assessed with CT arteriography. 10.  Aortic Atherosclerosis (ICD10-I70.0). Electronically Signed   By: Fidela Salisbury M.D.   On: 02/24/2022 01:21   US Venous Img Lower Bilateral (DVT)  Result Date: 02/24/2022 CLINICAL DATA:  Leg swelling.  History of DVT EXAM: BILATERAL LOWER EXTREMITY VENOUS DOPPLER ULTRASOUND TECHNIQUE: Gray-scale sonography with graded compression, as well as color Doppler and duplex ultrasound were performed to evaluate the lower extremity deep venous systems from the level of the common femoral vein and including the common femoral, femoral, profunda femoral, popliteal and calf veins including the posterior tibial, peroneal and gastrocnemius veins when visible. The superficial great saphenous vein was also interrogated. Spectral Doppler was utilized to evaluate flow at rest and with distal augmentation maneuvers in the common femoral, femoral and popliteal veins. COMPARISON:  None Available. FINDINGS: RIGHT LOWER EXTREMITY Common Femoral Vein: Again noted is chronic partially occlusive DVT. Partial compressibility. Saphenofemoral Junction: Partially occlusive chronic thrombus noted. Profunda Femoral Vein: No evidence of  thrombus. Normal compressibility and flow on color Doppler imaging. Femoral Vein: Partially occlusive chronic thrombus noted in the proximal femoral vein. No flow seen within the mid and distal femoral vein compatible with occlusive thrombosis. Popliteal Vein: No evidence of thrombus. Normal compressibility, respiratory phasicity and response to augmentation. Calf Veins: Not visualized Superficial Great Saphenous Vein: Not visualized Venous Reflux:  Not assessed Other Findings:  None. LEFT LOWER EXTREMITY Common Femoral Vein: Again noted is chronic partially occlusive thrombus. Partial compressibility. Saphenofemoral Junction: Partially occlusive chronic thrombus noted. Profunda Femoral Vein: No evidence of thrombus. Normal compressibility and flow on color Doppler imaging. Femoral Vein: Partially occlusive chronic thrombus again noted. Popliteal Vein: Partially occlusive chronic thrombus again noted. Calf Veins: Not visualized Superficial Great Saphenous Vein: Not visualized Venous Reflux:  Not assessed Other Findings:  None IMPRESSION: Chronic partially occlusive thrombus noted within the right common femoral vein and proximal superficial femoral vein. Occlusive thrombus noted in the mid to distal right femoral vein. This was partially occluded previously suggesting an acute component. Chronic partially occlusive thrombus in the left common femoral vein, superficial femoral vein and popliteal vein. Electronically Signed   By: Rolm Baptise M.D.   On: 02/24/2022 00:43   DG Chest Port 1 View  Result Date: 02/23/2022 CLINICAL DATA:  Dyspnea EXAM: PORTABLE CHEST 1 VIEW COMPARISON:  None Available. FINDINGS: The heart size and mediastinal contours are within normal limits. Both lungs are clear. The visualized skeletal structures are unremarkable. IMPRESSION: No active disease. Electronically Signed   By: Fidela Salisbury M.D.   On: 02/23/2022 23:52      Subjective: Seen and examined on the day of discharge.   Stable no distress.  Tolerating p.o. intake.  No evidence of blood loss.  Stable for discharge home.  Discharge Exam: Vitals:   03/01/22 0214 03/01/22 0828  BP: (!) 108/54 (!) 97/41  Pulse: 95 91  Resp: 17 18  Temp: 98.7 F (37.1 C) 98.1 F (36.7 C)  SpO2: 95% 93%   Vitals:   02/28/22 1604 02/28/22 2035 03/01/22 0214 03/01/22 0828  BP: (!) 104/53 113/60 (!) 108/54 (!) 97/41  Pulse: 83 87 95 91  Resp: '20 19 17 18  '$ Temp: 98.4 F (36.9 C) 98.4 F (36.9 C) 98.7 F (37.1 C) 98.1 F (36.7 C)  TempSrc: Oral Oral Oral Oral  SpO2: 95% 98% 95% 93%  Weight:  Height:        General: Pt is alert, awake, not in acute distress Cardiovascular: RRR, S1/S2 +, no rubs, no gallops Respiratory: CTA bilaterally, no wheezing, no rhonchi Abdominal: Soft, NT, ND, bowel sounds + Extremities: no edema, no cyanosis    The results of significant diagnostics from this hospitalization (including imaging, microbiology, ancillary and laboratory) are listed below for reference.     Microbiology: Recent Results (from the past 240 hour(s))  SARS Coronavirus 2 by RT PCR (hospital order, performed in [facility] hospital lab) *cepheid single result test* Anterior Nasal Swab     Status: None   Collection Time: 02/24/22 12:38 AM   Specimen: Anterior Nasal Swab  Result Value Ref Range Status   SARS Coronavirus 2 by RT PCR NEGATIVE NEGATIVE Final    Comment: (NOTE) SARS-CoV-2 target nucleic acids are NOT DETECTED.  The SARS-CoV-2 RNA is generally detectable in upper and lower respiratory specimens during the acute phase of infection. The lowest concentration of SARS-CoV-2 viral copies this assay can detect is 250 copies / mL. A negative result does not preclude SARS-CoV-2 infection and should not be used as the sole basis for treatment or other patient management decisions.  A negative result may occur with improper specimen collection / handling, submission of specimen other than nasopharyngeal  swab, presence of viral mutation(s) within the areas targeted by this assay, and inadequate number of viral copies (<250 copies / mL). A negative result must be combined with clinical observations, patient history, and epidemiological information.  Fact Sheet for Patients:   https://www.patel.info/  Fact Sheet for Healthcare Providers: https://hall.com/  This test is not yet approved or  cleared by the [place] FDA and has been authorized for detection and/or diagnosis of SARS-CoV-2 by FDA under an Emergency Use Authorization (EUA).  This EUA will remain in effect (meaning this test can be used) for the duration of the COVID-19 declaration under Section 564(b)(1) of the Act, 21 U.S.C. section 360bbb-3(b)(1), unless the authorization is terminated or revoked sooner.  Performed at [facility], [address] [place], [place] [address]   Urine Culture     Status: Abnormal   Collection Time: 02/24/22  2:01 AM   Specimen: Urine, Clean Catch  Result Value Ref Range Status   Specimen Description   Final    URINE, CLEAN CATCH Performed at [facility], [address] [place], [place] [address]    Special Requests   Final    NONE Performed at [facility], [address] [place], [place] [address]    Culture MULTIPLE SPECIES PRESENT, SUGGEST RECOLLECTION (A)  Final   Report Status 02/25/2022 FINAL  Final  Blood culture (routine x 2)     Status: Abnormal   Collection Time: 02/24/22  2:35 AM   Specimen: BLOOD  Result Value Ref Range Status   Specimen Description   Final    BLOOD LEFT FA Performed at [facility], [address] [place], [place] [address]    Special Requests   Final    BOTTLES DRAWN AEROBIC AND ANAEROBIC Blood Culture results may not be optimal due to an inadequate volume of blood received in culture bottles Performed at [facility], [address],  [place], [place] [address]    Culture  Setup Time   Final    GRAM POSITIVE COCCI IN BOTH AEROBIC AND ANAEROBIC BOTTLES CRITICAL RESULT CALLED TO, READ BACK BY AND VERIFIED WITH: JASON ROBBINS 02/25/22 0051 DE  Culture (A)  Final    STAPHYLOCOCCUS EPIDERMIDIS SUSCEPTIBILITIES PERFORMED ON PREVIOUS CULTURE WITHIN THE LAST 5 DAYS. Performed at [facility], [place] [address] [place],  [place] [address]    Report Status 02/28/2022 FINAL  Final  Blood culture (routine x 2)     Status: Abnormal   Collection Time: 02/24/22  2:35 AM   Specimen: BLOOD  Result Value Ref Range Status   Specimen Description   Final    BLOOD LEFT UPPER ARM Performed at [facility], [address] [place], [place] [address]    Special Requests   Final    BOTTLES DRAWN AEROBIC AND ANAEROBIC Blood Culture adequate volume Performed at [facility], [address] [place], [place] [address]    Culture  Setup Time   Final    Organism ID to follow [place] CRITICAL RESULT CALLED TO, READ BACK BY AND VERIFIED WITH: JASON ROBBINS 02/24/22 2156 DE    Culture (A)  Final    STAPHYLOCOCCUS HOMINIS THE SIGNIFICANCE OF ISOLATING THIS ORGANISM FROM A SINGLE SET OF BLOOD CULTURES WHEN MULTIPLE SETS ARE DRAWN IS UNCERTAIN. PLEASE NOTIFY THE MICROBIOLOGY DEPARTMENT WITHIN ONE WEEK IF SPECIATION AND SENSITIVITIES ARE REQUIRED. STAPHYLOCOCCUS EPIDERMIDIS    Report Status 02/28/2022 FINAL  Final   Organism ID, Bacteria STAPHYLOCOCCUS EPIDERMIDIS  Final      Susceptibility   Staphylococcus epidermidis - MIC*    CIPROFLOXACIN >=8 RESISTANT Resistant     ERYTHROMYCIN >=8 RESISTANT Resistant     GENTAMICIN 8 INTERMEDIATE Intermediate     OXACILLIN >=4 RESISTANT Resistant     TETRACYCLINE >=16 RESISTANT Resistant     VANCOMYCIN 1 SENSITIVE Sensitive     TRIMETH/SULFA 80 RESISTANT Resistant     CLINDAMYCIN >=8 RESISTANT Resistant     RIFAMPIN <=0.5 SENSITIVE Sensitive      Inducible Clindamycin NEGATIVE Sensitive     * STAPHYLOCOCCUS EPIDERMIDIS  Blood Culture ID Panel (Reflexed)     Status: Abnormal   Collection Time: 02/24/22  2:35 AM  Result Value Ref Range Status   Enterococcus faecalis NOT DETECTED NOT DETECTED Final   Enterococcus Faecium NOT DETECTED NOT DETECTED Final   Listeria monocytogenes NOT DETECTED NOT DETECTED Final   Staphylococcus species DETECTED (A) NOT DETECTED Final    Comment: CRITICAL RESULT CALLED TO, READ BACK BY AND VERIFIED WITH: JASON ROBBINS PHARMD AT 2156 02/24/2022 DE    Staphylococcus aureus (BCID) NOT DETECTED NOT DETECTED Final   Staphylococcus epidermidis DETECTED (A) NOT DETECTED Final    Comment: Methicillin (oxacillin) resistant coagulase negative staphylococcus. Possible blood culture contaminant (unless isolated from more than one blood culture draw or clinical case suggests pathogenicity). No antibiotic treatment is indicated for blood  culture contaminants. CRITICAL RESULT CALLED TO, READ BACK BY AND VERIFIED WITH: Violeta Gelinas PHARMD AT 2156 02/24/2022 DE    Staphylococcus lugdunensis NOT DETECTED NOT DETECTED Final   Streptococcus species NOT DETECTED NOT DETECTED Final   Streptococcus agalactiae NOT DETECTED NOT DETECTED Final   Streptococcus pneumoniae NOT DETECTED NOT DETECTED Final   Streptococcus pyogenes NOT DETECTED NOT DETECTED Final   A.calcoaceticus-baumannii NOT DETECTED NOT DETECTED Final   Bacteroides fragilis NOT DETECTED NOT DETECTED Final   Enterobacterales NOT DETECTED NOT DETECTED Final   Enterobacter cloacae complex NOT DETECTED NOT DETECTED Final   Escherichia coli NOT DETECTED NOT DETECTED Final   Klebsiella aerogenes NOT DETECTED NOT DETECTED Final   Klebsiella oxytoca NOT DETECTED NOT DETECTED Final  Klebsiella pneumoniae NOT DETECTED NOT DETECTED Final   Proteus species NOT DETECTED NOT DETECTED Final   Salmonella species NOT DETECTED NOT DETECTED Final   Serratia marcescens NOT  DETECTED NOT DETECTED Final   Haemophilus influenzae NOT DETECTED NOT DETECTED Final   Neisseria meningitidis NOT DETECTED NOT DETECTED Final   Pseudomonas aeruginosa NOT DETECTED NOT DETECTED Final   Stenotrophomonas maltophilia NOT DETECTED NOT DETECTED Final   Candida albicans NOT DETECTED NOT DETECTED Final   Candida auris NOT DETECTED NOT DETECTED Final   Candida glabrata NOT DETECTED NOT DETECTED Final   Candida krusei NOT DETECTED NOT DETECTED Final   Candida parapsilosis NOT DETECTED NOT DETECTED Final   Candida tropicalis NOT DETECTED NOT DETECTED Final   Cryptococcus neoformans/gattii NOT DETECTED NOT DETECTED Final   Methicillin resistance mecA/C DETECTED (A) NOT DETECTED Final    Comment: CRITICAL RESULT CALLED TO, READ BACK BY AND VERIFIED WITHVioleta Gelinas PHARMD AT 2156 02/24/2022 DE Performed at [facility], [address], [place], [place] [address]   Urine Culture     Status: Abnormal   Collection Time: 02/25/22  3:05 PM   Specimen: Urine, Random  Result Value Ref Range Status   Specimen Description   Final    URINE, RANDOM Performed at [facility], [address], [place], [place] [address]    Special Requests   Final    NONE Performed at [facility], [address] [place], [place] [address]    Culture MULTIPLE SPECIES PRESENT, SUGGEST RECOLLECTION (A)  Final   Report Status 02/26/2022 FINAL  Final  Culture, blood (Routine X 2) w Reflex to ID Panel     Status: None (Preliminary result)   Collection Time: 02/25/22  4:28 PM   Specimen: BLOOD RIGHT HAND  Result Value Ref Range Status   Specimen Description BLOOD RIGHT HAND  Final   Special Requests   Final    BOTTLES DRAWN AEROBIC AND ANAEROBIC Blood Culture adequate volume   Culture   Final    NO GROWTH 4 DAYS Performed at [facility], [address], [place], [place] [address]    Report Status PENDING  Incomplete  Culture, blood (Routine X 2) w Reflex to ID  Panel     Status: None (Preliminary result)   Collection Time: 02/25/22  4:38 PM   Specimen: BLOOD LEFT HAND  Result Value Ref Range Status   Specimen Description BLOOD LEFT HAND  Final   Special Requests   Final    BOTTLES DRAWN AEROBIC AND ANAEROBIC Blood Culture results may not be optimal due to an excessive volume of blood received in culture bottles   Culture   Final    NO GROWTH 4 DAYS Performed at [facility], [address] [place],  [address]    Report Status PENDING  Incomplete  Respiratory (~20 pathogens) panel by PCR     Status: None   Collection Time: 02/25/22  5:10 PM   Specimen: Nasopharyngeal Swab; Respiratory  Result Value Ref Range Status   Adenovirus NOT DETECTED NOT DETECTED Final   Coronavirus 229E NOT DETECTED NOT DETECTED Final    Comment: (NOTE) The Coronavirus on the Respiratory Panel, DOES NOT test for the novel  Coronavirus (2019 nCoV)    Coronavirus HKU1 NOT DETECTED NOT DETECTED Final   Coronavirus NL63 NOT DETECTED NOT DETECTED Final   Coronavirus OC43 NOT DETECTED NOT DETECTED Final   Metapneumovirus NOT DETECTED NOT DETECTED Final   Rhinovirus /  Enterovirus NOT DETECTED NOT DETECTED Final   Influenza A NOT DETECTED NOT DETECTED Final   Influenza B NOT DETECTED NOT DETECTED Final   Parainfluenza Virus 1 NOT DETECTED NOT DETECTED Final   Parainfluenza Virus 2 NOT DETECTED NOT DETECTED Final   Parainfluenza Virus 3 NOT DETECTED NOT DETECTED Final   Parainfluenza Virus 4 NOT DETECTED NOT DETECTED Final   Respiratory Syncytial Virus NOT DETECTED NOT DETECTED Final   Bordetella pertussis NOT DETECTED NOT DETECTED Final   Bordetella Parapertussis NOT DETECTED NOT DETECTED Final   Chlamydophila pneumoniae NOT DETECTED NOT DETECTED Final   Mycoplasma pneumoniae NOT DETECTED NOT DETECTED Final    Comment: Performed at [facility], [place] [address], [place], [place] [address]     Labs: BNP (last 3 results) Recent Labs     10/17/21 0606 01/29/22 1930 02/23/22 2327  BNP 927.6* 66.2 18.5   Basic Metabolic Panel: Recent Labs  Lab 02/23/22 2327 02/24/22 0201 02/25/22 0414 02/26/22 0508 02/27/22 0503  NA 139 140 139 140  --   K 3.3* 3.2* 4.1 3.9  --   CL 105 109 110 110  --   CO2 '27 23 24 25  '$ --   GLUCOSE 152* 103* 95 96  --   BUN '18 17 18 15  '$ --   CREATININE 0.89 0.82 0.84 0.74 0.72  CALCIUM 8.6* 7.9* 8.2* 8.4*  --    Liver Function Tests: Recent Labs  Lab 02/23/22 2327  AST 22  ALT 11  ALKPHOS 41  BILITOT 0.7  PROT 6.7  ALBUMIN 3.2*   No results for input(s): "LIPASE", "AMYLASE" in the last 168 hours. No results for input(s): "AMMONIA" in the last 168 hours. CBC: Recent Labs  Lab 02/23/22 2327 02/24/22 0907 02/25/22 0414 02/26/22 0508 02/27/22 0503 02/28/22 0602 03/01/22 0346  WBC 5.3   < > 5.1 4.6 4.6 4.2 5.3  NEUTROABS 3.5  --  3.5  --   --   --  3.5  HGB 6.8*   < > 8.3* 8.5* 8.3* 8.2* 9.1*  HCT 23.1*   < > 26.7* 27.6* 26.9* 26.7* 28.5*  MCV 85.9   < > 84.2 84.7 83.8 84.2 83.1  PLT 288   < > 225 219 217 223 257   < > = values in this interval not displayed.   Cardiac Enzymes: No results for input(s): "CKTOTAL", "CKMB", "CKMBINDEX", "TROPONINI" in the last 168 hours. BNP: Invalid input(s): "POCBNP" CBG: No results for input(s): "GLUCAP" in the last 168 hours. D-Dimer No results for input(s): "DDIMER" in the last 72 hours. Hgb A1c No results for input(s): "HGBA1C" in the last 72 hours. Lipid Profile No results for input(s): "CHOL", "HDL", "LDLCALC", "TRIG", "CHOLHDL", "LDLDIRECT" in the last 72 hours. Thyroid function studies No results for input(s): "TSH", "T4TOTAL", "T3FREE", "THYROIDAB" in the last 72 hours.  Invalid input(s): "FREET3" Anemia work up No results for input(s): "VITAMINB12", "FOLATE", "FERRITIN", "TIBC", "IRON", "RETICCTPCT" in the last 72 hours. Urinalysis    Component Value Date/Time   COLORURINE YELLOW (A) 02/24/2022 0201   APPEARANCEUR HAZY  (A) 02/24/2022 0201   APPEARANCEUR Cloudy (A) 01/03/2022 1443   LABSPEC >1.046 (H) 02/24/2022 0201   PHURINE 5.0 02/24/2022 0201   GLUCOSEU NEGATIVE 02/24/2022 0201   HGBUR NEGATIVE 02/24/2022 0201   BILIRUBINUR NEGATIVE 02/24/2022 0201   BILIRUBINUR Negative 01/03/2022 1443   KETONESUR NEGATIVE 02/24/2022 0201   PROTEINUR 30 (A) 02/24/2022 0201   NITRITE NEGATIVE 02/24/2022 0201   LEUKOCYTESUR MODERATE (  A) 02/24/2022 0201   Sepsis Labs Recent Labs  Lab 02/26/22 0508 02/27/22 0503 02/28/22 0602 03/01/22 0346  WBC 4.6 4.6 4.2 5.3   Microbiology Recent Results (from the past 240 hour(s))  SARS Coronavirus 2 by RT PCR (hospital order, performed in [facility] hospital lab) *cepheid single result test* Anterior Nasal Swab     Status: None   Collection Time: 02/24/22 12:38 AM   Specimen: Anterior Nasal Swab  Result Value Ref Range Status   SARS Coronavirus 2 by RT PCR NEGATIVE NEGATIVE Final    Comment: (NOTE) SARS-CoV-2 target nucleic acids are NOT DETECTED.  The SARS-CoV-2 RNA is generally detectable in upper and lower respiratory specimens during the acute phase of infection. The lowest concentration of SARS-CoV-2 viral copies this assay can detect is 250 copies / mL. A negative result does not preclude SARS-CoV-2 infection and should not be used as the sole basis for treatment or other patient management decisions.  A negative result may occur with improper specimen collection / handling, submission of specimen other than nasopharyngeal swab, presence of viral mutation(s) within the areas targeted by this assay, and inadequate number of viral copies (<250 copies / mL). A negative result must be combined with clinical observations, patient history, and epidemiological information.  Fact Sheet for Patients:   https://www.patel.info/  Fact Sheet for Healthcare Providers: https://hall.com/  This test is not yet approved or   cleared by the [place] FDA and has been authorized for detection and/or diagnosis of SARS-CoV-2 by FDA under an Emergency Use Authorization (EUA).  This EUA will remain in effect (meaning this test can be used) for the duration of the COVID-19 declaration under Section 564(b)(1) of the Act, 21 U.S.C. section 360bbb-3(b)(1), unless the authorization is terminated or revoked sooner.  Performed at [facility], [address] [place], [place] [address]   Urine Culture     Status: Abnormal   Collection Time: 02/24/22  2:01 AM   Specimen: Urine, Clean Catch  Result Value Ref Range Status   Specimen Description   Final    URINE, CLEAN CATCH Performed at [facility], [address], [place], [place] [address]    Special Requests   Final    NONE Performed at [facility], [address] [place], [place] [address]    Culture MULTIPLE SPECIES PRESENT, SUGGEST RECOLLECTION (A)  Final   Report Status 02/25/2022 FINAL  Final  Blood culture (routine x 2)     Status: Abnormal   Collection Time: 02/24/22  2:35 AM   Specimen: BLOOD  Result Value Ref Range Status   Specimen Description   Final    BLOOD LEFT FA Performed at [facility], [address] [place], [place] [address]    Special Requests   Final    BOTTLES DRAWN AEROBIC AND ANAEROBIC Blood Culture results may not be optimal due to an inadequate volume of blood received in culture bottles Performed at [facility], [address] [place], [place] [address]    Culture  Setup Time   Final    GRAM POSITIVE COCCI IN BOTH AEROBIC AND ANAEROBIC BOTTLES CRITICAL RESULT CALLED TO, READ BACK BY AND VERIFIED WITH: JASON ROBBINS 02/25/22 0051 DE    Culture (A)  Final    STAPHYLOCOCCUS EPIDERMIDIS SUSCEPTIBILITIES PERFORMED ON PREVIOUS CULTURE WITHIN THE LAST 5 DAYS. Performed at [facility], [place] [address], [place], [place] [address]    Report Status 02/28/2022 FINAL   Final  Blood culture (routine x 2)     Status: Abnormal   Collection Time: 02/24/22  2:35 AM   Specimen: BLOOD  Result Value Ref Range Status   Specimen Description   Final    BLOOD LEFT UPPER ARM Performed at [facility], [address], [place], [place] [address]    Special Requests   Final    BOTTLES DRAWN AEROBIC AND ANAEROBIC Blood Culture adequate volume Performed at [facility], [address] [place], [place] [address]    Culture  Setup Time   Final    Organism ID to follow [place] CRITICAL RESULT CALLED TO, READ BACK BY AND VERIFIED WITH: JASON ROBBINS 02/24/22 2156 DE    Culture (A)  Final    STAPHYLOCOCCUS HOMINIS THE SIGNIFICANCE OF ISOLATING THIS ORGANISM FROM A SINGLE SET OF BLOOD CULTURES WHEN MULTIPLE SETS ARE DRAWN IS UNCERTAIN. PLEASE NOTIFY THE MICROBIOLOGY DEPARTMENT WITHIN ONE WEEK IF SPECIATION AND SENSITIVITIES ARE REQUIRED. STAPHYLOCOCCUS EPIDERMIDIS    Report Status 02/28/2022 FINAL  Final   Organism ID, Bacteria STAPHYLOCOCCUS EPIDERMIDIS  Final      Susceptibility   Staphylococcus epidermidis - MIC*    CIPROFLOXACIN >=8 RESISTANT Resistant     ERYTHROMYCIN >=8 RESISTANT Resistant     GENTAMICIN 8 INTERMEDIATE Intermediate     OXACILLIN >=4 RESISTANT Resistant     TETRACYCLINE >=16 RESISTANT Resistant     VANCOMYCIN 1 SENSITIVE Sensitive     TRIMETH/SULFA 80 RESISTANT Resistant     CLINDAMYCIN >=8 RESISTANT Resistant     RIFAMPIN <=0.5 SENSITIVE Sensitive     Inducible Clindamycin NEGATIVE Sensitive     * STAPHYLOCOCCUS EPIDERMIDIS  Blood Culture ID Panel (Reflexed)     Status: Abnormal   Collection Time: 02/24/22  2:35 AM  Result Value Ref Range Status   Enterococcus faecalis NOT DETECTED NOT DETECTED Final   Enterococcus Faecium NOT DETECTED NOT DETECTED Final   Listeria monocytogenes NOT DETECTED NOT DETECTED Final   Staphylococcus species DETECTED (A) NOT DETECTED Final    Comment:  CRITICAL RESULT CALLED TO, READ BACK BY AND VERIFIED WITH: JASON ROBBINS PHARMD AT 2156 02/24/2022 DE    Staphylococcus aureus (BCID) NOT DETECTED NOT DETECTED Final   Staphylococcus epidermidis DETECTED (A) NOT DETECTED Final    Comment: Methicillin (oxacillin) resistant coagulase negative staphylococcus. Possible blood culture contaminant (unless isolated from more than one blood culture draw or clinical case suggests pathogenicity). No antibiotic treatment is indicated for blood  culture contaminants. CRITICAL RESULT CALLED TO, READ BACK BY AND VERIFIED WITH: Violeta Gelinas PHARMD AT 2156 02/24/2022 DE    Staphylococcus lugdunensis NOT DETECTED NOT DETECTED Final   Streptococcus species NOT DETECTED NOT DETECTED Final   Streptococcus agalactiae NOT DETECTED NOT DETECTED Final   Streptococcus pneumoniae NOT DETECTED NOT DETECTED Final   Streptococcus pyogenes NOT DETECTED NOT DETECTED Final   A.calcoaceticus-baumannii NOT DETECTED NOT DETECTED Final   Bacteroides fragilis NOT DETECTED NOT DETECTED Final   Enterobacterales NOT DETECTED NOT DETECTED Final   Enterobacter cloacae complex NOT DETECTED NOT DETECTED Final   Escherichia coli NOT DETECTED NOT DETECTED Final   Klebsiella aerogenes NOT DETECTED NOT DETECTED Final   Klebsiella oxytoca NOT DETECTED NOT DETECTED Final   Klebsiella pneumoniae NOT DETECTED NOT DETECTED Final   Proteus species NOT DETECTED NOT DETECTED Final   Salmonella species NOT DETECTED NOT DETECTED Final   Serratia marcescens NOT DETECTED NOT DETECTED Final   Haemophilus influenzae NOT DETECTED NOT DETECTED  Final   Neisseria meningitidis NOT DETECTED NOT DETECTED Final   Pseudomonas aeruginosa NOT DETECTED NOT DETECTED Final   Stenotrophomonas maltophilia NOT DETECTED NOT DETECTED Final   Candida albicans NOT DETECTED NOT DETECTED Final   Candida auris NOT DETECTED NOT DETECTED Final   Candida glabrata NOT DETECTED NOT DETECTED Final   Candida krusei NOT  DETECTED NOT DETECTED Final   Candida parapsilosis NOT DETECTED NOT DETECTED Final   Candida tropicalis NOT DETECTED NOT DETECTED Final   Cryptococcus neoformans/gattii NOT DETECTED NOT DETECTED Final   Methicillin resistance mecA/C DETECTED (A) NOT DETECTED Final    Comment: CRITICAL RESULT CALLED TO, READ BACK BY AND VERIFIED WITHVioleta Gelinas PHARMD AT 2156 02/24/2022 DE Performed at [facility], [address] [place], [place] [address]   Urine Culture     Status: Abnormal   Collection Time: 02/25/22  3:05 PM   Specimen: Urine, Random  Result Value Ref Range Status   Specimen Description   Final    URINE, RANDOM Performed at [facility], [address] [place], [place] [address]    Special Requests   Final    NONE Performed at [facility], [address] [place], [place] [address]    Culture MULTIPLE SPECIES PRESENT, SUGGEST RECOLLECTION (A)  Final   Report Status 02/26/2022 FINAL  Final  Culture, blood (Routine X 2) w Reflex to ID Panel     Status: None (Preliminary result)   Collection Time: 02/25/22  4:28 PM   Specimen: BLOOD RIGHT HAND  Result Value Ref Range Status   Specimen Description BLOOD RIGHT HAND  Final   Special Requests   Final    BOTTLES DRAWN AEROBIC AND ANAEROBIC Blood Culture adequate volume   Culture   Final    NO GROWTH 4 DAYS Performed at [facility], [address] [place], [place] [address]    Report Status PENDING  Incomplete  Culture, blood (Routine X 2) w Reflex to ID Panel     Status: None (Preliminary result)   Collection Time: 02/25/22  4:38 PM   Specimen: BLOOD LEFT HAND  Result Value Ref Range Status   Specimen Description BLOOD LEFT HAND  Final   Special Requests   Final    BOTTLES DRAWN AEROBIC AND ANAEROBIC Blood Culture results may not be optimal due to an excessive volume of blood received in culture bottles   Culture   Final    NO GROWTH 4 DAYS Performed at [facility],  [address] [place], [place] [address]    Report Status PENDING  Incomplete  Respiratory (~20 pathogens) panel by PCR     Status: None   Collection Time: 02/25/22  5:10 PM   Specimen: Nasopharyngeal Swab; Respiratory  Result Value Ref Range Status   Adenovirus NOT DETECTED NOT DETECTED Final   Coronavirus 229E NOT DETECTED NOT DETECTED Final    Comment: (NOTE) The Coronavirus on the Respiratory Panel, DOES NOT test for the novel  Coronavirus (2019 nCoV)    Coronavirus HKU1 NOT DETECTED NOT DETECTED Final   Coronavirus NL63 NOT DETECTED NOT DETECTED Final   Coronavirus OC43 NOT DETECTED NOT DETECTED Final   Metapneumovirus NOT DETECTED NOT DETECTED Final   Rhinovirus / Enterovirus NOT DETECTED NOT DETECTED Final   Influenza A NOT DETECTED NOT DETECTED Final   Influenza B NOT DETECTED NOT DETECTED Final   Parainfluenza Virus 1 NOT DETECTED NOT DETECTED Final   Parainfluenza Virus 2 NOT DETECTED NOT  DETECTED Final   Parainfluenza Virus 3 NOT DETECTED NOT DETECTED Final   Parainfluenza Virus 4 NOT DETECTED NOT DETECTED Final   Respiratory Syncytial Virus NOT DETECTED NOT DETECTED Final   Bordetella pertussis NOT DETECTED NOT DETECTED Final   Bordetella Parapertussis NOT DETECTED NOT DETECTED Final   Chlamydophila pneumoniae NOT DETECTED NOT DETECTED Final   Mycoplasma pneumoniae NOT DETECTED NOT DETECTED Final    Comment: Performed at [facility], [address] [place], [place] [address]     Time coordinating discharge: Over 30 minutes  SIGNED:   Sidney Ace, MD  [facility] 03/01/2022, 11:09 AM Pager   If 7PM-7AM, please contact night-coverage

## 2022-03-01 NOTE — TOC Transition Note (Signed)
Transition of Care [facility]) - CM/SW Discharge Note   Patient Details  Name: Courtney Mack MRN: [id number] Date of Birth: 1955-10-17  [facility] CM/SW Contact:  Magnus Ivan, LCSW Phone Number: 03/01/2022, 10:14 AM   Clinical Narrative:    Patient to DC home today. Per MD patient does not need HH or other TOC needs.    Final next level of care: Home/Self Care Barriers to Discharge: Barriers Resolved   Patient Goals and CMS Choice        Discharge Placement                       Discharge Plan and Services                                     Social Determinants of Health (SDOH) Interventions     Readmission Risk Interventions    02/24/2022    2:07 PM 12/27/2021    2:26 PM 11/18/2021    9:04 AM  Readmission Risk Prevention Plan  Transportation Screening Complete Complete Complete  Medication Review Press photographer) Complete Complete Complete  PCP or Specialist appointment within 3-5 days of discharge   Complete  HRI or Home Care Consult Patient refused Complete Complete  SW Recovery Care/Counseling Consult  Complete Complete  Palliative Care Screening Complete Complete Complete  Skilled Nursing Facility Not Applicable Not Applicable Complete

## 2022-03-02 LAB — CULTURE, BLOOD (ROUTINE X 2)
Culture: NO GROWTH
Culture: NO GROWTH
Special Requests: ADEQUATE

## 2022-03-05 ENCOUNTER — Telehealth: Payer: Self-pay

## 2022-03-05 ENCOUNTER — Other Ambulatory Visit: Payer: Self-pay

## 2022-03-05 DIAGNOSIS — D500 Iron deficiency anemia secondary to blood loss (chronic): Secondary | ICD-10-CM

## 2022-03-05 NOTE — Telephone Encounter (Signed)
Pt missed appt due to hospitalization last week. Please rescedule and inform pt of appts.   This week: Lab (D1)  MD/ poss blood or venofer (D2)   Cancel appts on 9/22 & 9/26 once other appts have been scheduled.

## 2022-03-05 NOTE — Telephone Encounter (Signed)
Looks like lab/Md/ venofer scheduled for tomorrow. Pt will not be able to have venofer tomorrow because labs will not results in time.  Per note below: poss blood or venofer on Day 2. Ok to keep lab/MD tomorrow as scheduled.  Also, appt on 9/22 and 9/26 need to be cancelled.

## 2022-03-06 ENCOUNTER — Inpatient Hospital Stay: Payer: Medicaid Other

## 2022-03-06 ENCOUNTER — Inpatient Hospital Stay: Payer: Medicaid Other | Admitting: Oncology

## 2022-03-06 MED FILL — Iron Sucrose Inj 20 MG/ML (Fe Equiv): INTRAVENOUS | Qty: 10 | Status: AC

## 2022-03-06 NOTE — Telephone Encounter (Deleted)
Pt will need time for poss blood tomorrow. (Venofer or blood).  Looks like she's only scheduled for venofer tomorrow. We can inform her of appt time when she comes in this afternoon.

## 2022-03-06 NOTE — Telephone Encounter (Signed)
Thanks

## 2022-03-07 ENCOUNTER — Inpatient Hospital Stay: Payer: Medicaid Other

## 2022-03-12 ENCOUNTER — Encounter: Payer: Self-pay | Admitting: Radiology

## 2022-03-12 ENCOUNTER — Emergency Department: Payer: Medicare HMO

## 2022-03-12 ENCOUNTER — Inpatient Hospital Stay
Admission: EM | Admit: 2022-03-12 | Discharge: 2022-03-16 | DRG: 378 | Disposition: A | Discharge status: Home / Self Care | Payer: Medicare HMO | Attending: Internal Medicine | Admitting: Internal Medicine

## 2022-03-12 ENCOUNTER — Other Ambulatory Visit: Payer: Self-pay

## 2022-03-12 DIAGNOSIS — K921 Melena: Secondary | ICD-10-CM | POA: Diagnosis not present

## 2022-03-12 DIAGNOSIS — Z936 Other artificial openings of urinary tract status: Secondary | ICD-10-CM

## 2022-03-12 DIAGNOSIS — Z86711 Personal history of pulmonary embolism: Secondary | ICD-10-CM | POA: Diagnosis not present

## 2022-03-12 DIAGNOSIS — K31819 Angiodysplasia of stomach and duodenum without bleeding: Secondary | ICD-10-CM | POA: Diagnosis not present

## 2022-03-12 DIAGNOSIS — K31811 Angiodysplasia of stomach and duodenum with bleeding: Secondary | ICD-10-CM | POA: Diagnosis present

## 2022-03-12 DIAGNOSIS — Z6835 Body mass index (BMI) 35.0-35.9, adult: Secondary | ICD-10-CM

## 2022-03-12 DIAGNOSIS — D649 Anemia, unspecified: Secondary | ICD-10-CM | POA: Diagnosis not present

## 2022-03-12 DIAGNOSIS — Z833 Family history of diabetes mellitus: Secondary | ICD-10-CM | POA: Diagnosis not present

## 2022-03-12 DIAGNOSIS — D509 Iron deficiency anemia, unspecified: Secondary | ICD-10-CM | POA: Diagnosis present

## 2022-03-12 DIAGNOSIS — Z8249 Family history of ischemic heart disease and other diseases of the circulatory system: Secondary | ICD-10-CM | POA: Diagnosis not present

## 2022-03-12 DIAGNOSIS — Z8542 Personal history of malignant neoplasm of other parts of uterus: Secondary | ICD-10-CM

## 2022-03-12 DIAGNOSIS — I82511 Chronic embolism and thrombosis of right femoral vein: Secondary | ICD-10-CM | POA: Diagnosis present

## 2022-03-12 DIAGNOSIS — K219 Gastro-esophageal reflux disease without esophagitis: Secondary | ICD-10-CM | POA: Diagnosis present

## 2022-03-12 DIAGNOSIS — I110 Hypertensive heart disease with heart failure: Secondary | ICD-10-CM | POA: Diagnosis present

## 2022-03-12 DIAGNOSIS — E7800 Pure hypercholesterolemia, unspecified: Secondary | ICD-10-CM | POA: Diagnosis present

## 2022-03-12 DIAGNOSIS — K922 Gastrointestinal hemorrhage, unspecified: Secondary | ICD-10-CM | POA: Diagnosis present

## 2022-03-12 DIAGNOSIS — I5032 Chronic diastolic (congestive) heart failure: Secondary | ICD-10-CM | POA: Diagnosis present

## 2022-03-12 DIAGNOSIS — Z7951 Long term (current) use of inhaled steroids: Secondary | ICD-10-CM

## 2022-03-12 DIAGNOSIS — Z95828 Presence of other vascular implants and grafts: Secondary | ICD-10-CM

## 2022-03-12 DIAGNOSIS — E119 Type 2 diabetes mellitus without complications: Secondary | ICD-10-CM | POA: Diagnosis present

## 2022-03-12 DIAGNOSIS — N1330 Unspecified hydronephrosis: Secondary | ICD-10-CM | POA: Diagnosis present

## 2022-03-12 DIAGNOSIS — C541 Malignant neoplasm of endometrium: Secondary | ICD-10-CM | POA: Diagnosis present

## 2022-03-12 DIAGNOSIS — T83098A Other mechanical complication of other indwelling urethral catheter, initial encounter: Secondary | ICD-10-CM | POA: Insufficient documentation

## 2022-03-12 DIAGNOSIS — Z79899 Other long term (current) drug therapy: Secondary | ICD-10-CM

## 2022-03-12 DIAGNOSIS — E785 Hyperlipidemia, unspecified: Secondary | ICD-10-CM | POA: Diagnosis present

## 2022-03-12 DIAGNOSIS — Z7901 Long term (current) use of anticoagulants: Secondary | ICD-10-CM | POA: Diagnosis not present

## 2022-03-12 DIAGNOSIS — E66813 Obesity, class 3: Secondary | ICD-10-CM | POA: Diagnosis present

## 2022-03-12 DIAGNOSIS — I82409 Acute embolism and thrombosis of unspecified deep veins of unspecified lower extremity: Secondary | ICD-10-CM | POA: Diagnosis present

## 2022-03-12 LAB — URINALYSIS, COMPLETE (UACMP) WITH MICROSCOPIC
Bilirubin Urine: NEGATIVE
Glucose, UA: NEGATIVE mg/dL
Hgb urine dipstick: NEGATIVE
Ketones, ur: NEGATIVE mg/dL
Nitrite: NEGATIVE
Protein, ur: 100 mg/dL — AB
Specific Gravity, Urine: 1.02 (ref 1.005–1.030)
pH: 9 — ABNORMAL HIGH (ref 5.0–8.0)

## 2022-03-12 LAB — COMPREHENSIVE METABOLIC PANEL
ALT: 11 U/L (ref 0–44)
AST: 18 U/L (ref 15–41)
Albumin: 3.6 g/dL (ref 3.5–5.0)
Alkaline Phosphatase: 43 U/L (ref 38–126)
Anion gap: 11 (ref 5–15)
BUN: 27 mg/dL — ABNORMAL HIGH (ref 8–23)
CO2: 28 mmol/L (ref 22–32)
Calcium: 9.4 mg/dL (ref 8.9–10.3)
Chloride: 101 mmol/L (ref 98–111)
Creatinine, Ser: 0.83 mg/dL (ref 0.44–1.00)
GFR, Estimated: >60 mL/min (ref >60)
Glucose, Bld: 133 mg/dL — ABNORMAL HIGH (ref 70–99)
Potassium: 3.5 mmol/L (ref 3.5–5.1)
Sodium: 140 mmol/L (ref 135–145)
Total Bilirubin: 1.1 mg/dL (ref 0.3–1.2)
Total Protein: 7.1 g/dL (ref 6.5–8.1)

## 2022-03-12 LAB — CBC WITH DIFFERENTIAL/PLATELET
Abs Immature Granulocytes: 0.02 10*3/uL (ref 0.00–0.07)
Basophils Absolute: 0.1 10*3/uL (ref 0.0–0.1)
Basophils Relative: 1 %
Eosinophils Absolute: 0.4 10*3/uL (ref 0.0–0.5)
Eosinophils Relative: 7 %
HCT: 19.5 % — ABNORMAL LOW (ref 36.0–46.0)
Hemoglobin: 5.5 g/dL — ABNORMAL LOW (ref 12.0–15.0)
Immature Granulocytes: 0 %
Lymphocytes Relative: 13 %
Lymphs Abs: 0.8 10*3/uL (ref 0.7–4.0)
MCH: 23.7 pg — ABNORMAL LOW (ref 26.0–34.0)
MCHC: 28.2 g/dL — ABNORMAL LOW (ref 30.0–36.0)
MCV: 84.1 fL (ref 80.0–100.0)
Monocytes Absolute: 0.3 10*3/uL (ref 0.1–1.0)
Monocytes Relative: 6 %
Neutro Abs: 4.4 10*3/uL (ref 1.7–7.7)
Neutrophils Relative %: 73 %
Platelets: 322 10*3/uL (ref 150–400)
RBC: 2.32 MIL/uL — ABNORMAL LOW (ref 3.87–5.11)
RDW: 18.5 % — ABNORMAL HIGH (ref 11.5–15.5)
WBC: 6.1 10*3/uL (ref 4.0–10.5)
nRBC: 0 % (ref 0.0–0.2)

## 2022-03-12 LAB — PROTIME-INR
INR: 1.3 — ABNORMAL HIGH (ref 0.8–1.2)
Prothrombin Time: 16.4 seconds — ABNORMAL HIGH (ref 11.4–15.2)

## 2022-03-12 LAB — LACTIC ACID, PLASMA: Lactic Acid, Venous: 1.3 mmol/L (ref 0.5–1.9)

## 2022-03-12 LAB — PREPARE RBC (CROSSMATCH)

## 2022-03-12 MED ORDER — IOHEXOL 350 MG/ML SOLN
100.0000 mL | Freq: Once | INTRAVENOUS | Status: AC | PRN
  Administered 2022-03-12: 100 mL via INTRAVENOUS

## 2022-03-12 MED ORDER — ONDANSETRON HCL 4 MG/2ML IJ SOLN: 4.0000 mg | Freq: Four times a day (QID) | INTRAMUSCULAR | Status: DC | PRN

## 2022-03-12 MED ORDER — SODIUM CHLORIDE 0.9 % IV SOLN
Freq: Once | INTRAVENOUS | Status: AC
  Administered 2022-03-15: 20 mL/h via INTRAVENOUS

## 2022-03-12 MED ORDER — MORPHINE SULFATE (PF) 2 MG/ML IV SOLN: 2.0000 mg | INTRAVENOUS | Status: DC | PRN

## 2022-03-12 MED ORDER — PANTOPRAZOLE SODIUM 40 MG IV SOLR
40.0000 mg | Freq: Two times a day (BID) | INTRAVENOUS | Status: DC
  Administered 2022-03-16: 40 mg via INTRAVENOUS
  Filled 2022-03-12: qty 10

## 2022-03-12 MED ORDER — ONDANSETRON HCL 4 MG PO TABS: 4.0000 mg | ORAL_TABLET | Freq: Four times a day (QID) | ORAL | Status: DC | PRN

## 2022-03-12 MED ORDER — PANTOPRAZOLE 80MG IVPB - SIMPLE MED
80.0000 mg | Freq: Once | INTRAVENOUS | Status: AC
  Administered 2022-03-12: 80 mg via INTRAVENOUS
  Filled 2022-03-12: qty 100

## 2022-03-12 MED ORDER — PANTOPRAZOLE INFUSION (NEW) - SIMPLE MED
8.0000 mg/h | INTRAVENOUS | Status: AC
  Administered 2022-03-12 – 2022-03-15 (×3): 8 mg/h via INTRAVENOUS
  Filled 2022-03-12 (×4): qty 100

## 2022-03-12 MED ORDER — FLUTICASONE FUROATE-VILANTEROL 100-25 MCG/ACT IN AEPB
1.0000 | INHALATION_SPRAY | Freq: Every day | RESPIRATORY_TRACT | Status: DC
  Administered 2022-03-13 – 2022-03-16 (×4): 1 via RESPIRATORY_TRACT
  Filled 2022-03-12 (×2): qty 28

## 2022-03-12 MED FILL — Iron Sucrose Inj 20 MG/ML (Fe Equiv): INTRAVENOUS | Qty: 10 | Status: AC

## 2022-03-12 NOTE — ED Notes (Signed)
Per MD order, due to pts CHF, no maintenance fluids at this time. Will only give saline to flush after blood products until further orders received.

## 2022-03-12 NOTE — Assessment & Plan Note (Signed)
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  IV ppi infusion.

## 2022-03-12 NOTE — ED Triage Notes (Signed)
Pt here with weakness and c/o her urinary bag not filling like it usually does. Pt also has back pain and states she just does not feel well.

## 2022-03-12 NOTE — Hospital Course (Addendum)
Courtney Mack is a 66 y.o. year old female with PMH recurrent septic shock associated with left hydronephrosis without obstructing stone managed with left nephrostomy tube most recently exchanged by IR on  02/25/2022, DVT on Xarelto, GI bleed, endometrial cancer, heart failure, iron deficiency anemia, and recurrent UTIs who presented to the ED overnight with reports of weakness and nondraining left nephrostomy tube.    Her left hydronephrosis was previously managed with an indwelling left ureteral stent, however this was externalized to a nephrostomy tube in the setting of persistent hematuria and blood loss anemia.  ED course.  Hemodynamically stable.  Hemoglobin was found to be at 5.5, she was admitted for concern of GI bleed.  Received 2 unit of PRBC with improvement of hemoglobin to 6.6, at that point overnight provider ordered third unit. Admission labs also notable for UA with 6-10 RBCs/hpf, 11-20 WBCs/hpf, and rare bacteria; blood culture is pending with no growth at <24 hours; lactate 1.3; creatinine 0.83 (at baseline); and WBC count 6.1.  No urine culture sent.  Less likely a UTI most likely secondary to chronic irritation with nephrostomy tube. CT angio GI bleed yesterday showed appropriate position of the left nephrostomy tube without hydronephrosis.  There is a vague urothelial thickening, likely inflammatory, on the left.  Urology and gastroenterology was consulted.  9/6: Patient with chronic GI bleed.  GI will repeat EGD on Friday as her last dose of Xarelto was on 03/12/2022. Urology was consulted for concern of malfunctioning of nephrostomy tube, it was found to be draining well and no intervention needed at this time.  9/7: Patient with no bowel movement since Tuesday.  No more active bleeding.  Slight decrease in hemoglobin to 7.4 after increasing up to 8.4 with 3 unit of PRBC. Will be going for EGD tomorrow-keep holding Xarelto. Nephrostomy tube working without any  problem.  9/8: Hemodynamically stable with some improvement in hemoglobin.  No more melena.  EGD with some vascular ectasia found in prepyloric region s/p argon plasma. GI would like to keep her on clear liquid and observe for another night.  They were suggesting cryotherapy by outpatient GI for further management of her GAVE.  9/9: Hemoglobin remained stable.  She was started on iron supplement.  PT with no follow-up recommendations.  Patient appears to be at her baseline. GI is recommending starting Xarelto after 3 days. If patient experience more GI bleeding she might have to discontinue Xarelto.  Patient need to have a close follow-up with gastroenterology who can decide about cryotherapy if needed.  She will continue on current medications and follow-up with her providers closely for further recommendations.

## 2022-03-12 NOTE — ED Provider Notes (Signed)
[facility] Provider Note    Event Date/Time   First MD Initiated Contact with Patient 03/12/22 1712     (approximate)   History   Weakness   HPI  Courtney Mack is a 66 y.o. female who on review of discharge summary from August 25 of this year.  Patient was started on Xarelto for DVT and was admitted with a symptomatic anemia due to chronic blood loss hypotension   She has been experiencing fatigue today, concern her blood count may have dropped again.  She continues to have dark stool daily since leaving the hospital and continues on her blood thinner  No chest pain no trouble breathing.  Feels somewhat fatigued generally weak.  Reports same in the past with low blood counts.  Daughter also at the bedside patient reports she has had previous blood transfusions  Physical Exam   Triage Vital Signs: ED Triage Vitals  Enc Vitals Group     BP 03/12/22 1526 (!) 104/46     Pulse Rate 03/12/22 1526 94     Resp 03/12/22 1526 20     Temp 03/12/22 1526 98.3 F (36.8 C)     Temp Source 03/12/22 1526 Oral     SpO2 03/12/22 1526 97 %     Weight 03/12/22 1527 175 lb 0.7 oz (79.4 kg)     Height 03/12/22 1527 '4\' 11"'$  (1.499 m)     Head Circumference --      Peak Flow --      Pain Score 03/12/22 1527 9     Pain Loc --      Pain Edu? --      Excl. in [place]? --     Most recent vital signs: Vitals:   03/12/22 1526 03/12/22 1706  BP: (!) 104/46 108/63  Pulse: 94 93  Resp: 20 20  Temp: 98.3 F (36.8 C) 98.4 F (36.9 C)  SpO2: 97% 100%     General: Awake, no distress.  CV:  Good peripheral perfusion.  Somewhat pale in complexion but normal heart tones and strong pulses peripherally Resp:  Normal effort.  Clear bilateral Abd:  No distention.  Nontender throughout Other:     ED Results / Procedures / Treatments   Labs (all labs ordered are listed, but only abnormal results are displayed) Labs Reviewed  COMPREHENSIVE METABOLIC PANEL - Abnormal;  Notable for the following components:      Result Value   Glucose, Bld 133 (*)    BUN 27 (*)    All other components within normal limits  CBC WITH DIFFERENTIAL/PLATELET - Abnormal; Notable for the following components:   RBC 2.32 (*)    Hemoglobin 5.5 (*)    HCT 19.5 (*)    MCH 23.7 (*)    MCHC 28.2 (*)    RDW 18.5 (*)    All other components within normal limits  PROTIME-INR - Abnormal; Notable for the following components:   Prothrombin Time 16.4 (*)    INR 1.3 (*)    All other components within normal limits  CULTURE, BLOOD (ROUTINE X 2)  CULTURE, BLOOD (ROUTINE X 2)  LACTIC ACID, PLASMA  LACTIC ACID, PLASMA  URINALYSIS, ROUTINE W REFLEX MICROSCOPIC  URINALYSIS, COMPLETE (UACMP) WITH MICROSCOPIC  TYPE AND SCREEN  PREPARE RBC (CROSSMATCH)    PROCEDURES:  Critical Care performed: Yes, see critical care procedure note(s)  Procedures CRITICAL CARE Performed by: Delman Kitten   Total critical care time: 35 minutes  Critical care  time was exclusive of separately billable procedures and treating other patients.  Critical care was necessary to treat or prevent imminent or life-threatening deterioration.  Critical care was time spent personally by me on the following activities: development of treatment plan with patient and/or surrogate as well as nursing, discussions with consultants, evaluation of patient's response to treatment, examination of patient, obtaining history from patient or surrogate, ordering and performing treatments and interventions, ordering and review of laboratory studies, ordering and review of radiographic studies, pulse oximetry and re-evaluation of patient's condition.   MEDICATIONS ORDERED IN ED: Medications  0.9 %  sodium chloride infusion (has no administration in time range)  fluticasone furoate-vilanterol (BREO ELLIPTA) 100-25 MCG/ACT 1 puff (has no administration in time range)  morphine (PF) 2 MG/ML injection 2 mg (has no administration in  time range)  ondansetron (ZOFRAN) tablet 4 mg (has no administration in time range)    Or  ondansetron (ZOFRAN) injection 4 mg (has no administration in time range)  pantoprozole (PROTONIX) 80 mg /NS 100 mL infusion (has no administration in time range)  pantoprazole (PROTONIX) injection 40 mg (has no administration in time range)  iohexol (OMNIPAQUE) 350 MG/ML injection 100 mL (100 mLs Intravenous Contrast Given 03/12/22 1910)  pantoprazole (PROTONIX) 80 mg /NS 100 mL IVPB (0 mg Intravenous Stopped 03/12/22 2000)     IMPRESSION / MDM / ASSESSMENT AND PLAN / ED COURSE  I reviewed the triage vital signs and the nursing notes.                              Differential diagnosis includes, but is not limited to, GI bleeding, occult GI bleed, recurrence of previous GI bleeding as denoted by previous upper endoscopy, also evaluate for other etiology of fatigue such as infection, UTI, pneumonia etc. though based on her clinical history and ongoing dark stools and use of anticoagulant seems likely that occult or ongoing GI bleeding in the setting of her ongoing dark stools is likely the culprit.  Work-up here reassuring.  Notable for significant anemia though.  INR is elevated patient reports last used anticoagulant this morning, she is hemodynamically stable and I do not see indication for acute reversal given her known thrombosis.  Patient and daughter both verbally acknowledged and agreeable with blood transfusion after discussing risks and benefits  Labs interpreted by me.  Most notable is the significant anemia  Patient's presentation is most consistent with acute presentation with potential threat to life or bodily function.  The patient is on the cardiac monitor to evaluate for evidence of arrhythmia and/or significant heart rate changes.    Consulted with and admission decision made in conjunction with Dr. Posey Pronto of hospitalist service.  Patient and daughter agreeable with plan for  admission  FINAL CLINICAL IMPRESSION(S) / ED DIAGNOSES   Final diagnoses:  Symptomatic anemia     Rx / DC Orders   ED Discharge Orders     None        Note:  This document was prepared using Dragon voice recognition software and may include unintentional dictation errors.   Delman Kitten, MD 03/12/22 2030

## 2022-03-12 NOTE — H&P (Signed)
History and Physical    Courtney Mack ZDG:[id number] DOB: 1955/10/06 DOA: 03/12/2022  PCP: [facility] [place]    Patient coming from:  Home    Chief Complaint:  Dysfunction of left nephrostomy tube.    HPI:  Courtney Mack is a 66 y.o. female seen in ed.  Left nephrostomy tube since yesterday morning.  Patient states she is having black stools intermittently and no nausea vomiting fevers or chills.  Patient is otherwise in fair historian states that she does not have much.   Chart review shows patient was admitted on February 24, 2022 for leg swelling, shortness of breath.  During admission patient was found to have hypotension and anemia with guaiac positive stools.  Lower extremity Doppler showed acute on chronic DVT" partially posterior In the right common femoral vein and proximal superficial femoral vein.  CT angio chest abdomen was negative for any acute PE no hydronephrosis patient does have a left percutaneous nephrostomy catheter.  Patient also has an IVC filter.  Patient was started on heparin drip for her chronic DVT and acute DVT GI was consulted patient was started on IV antibiotics with positive cultures patient had a successful nephrostomy tube exchange on 8/21.  Patient received anticoagulation with Xarelto was restarted, along with IV PPI therapy.  Patient symptomatic anemia patient was transfused. Patient had bilateral hydronephrosis for which she had a left nephrostomy tube in June 2023.  Pt has past medical history of : Past Medical History:  Diagnosis Date   Acute bilateral deep vein thrombosis (DVT) of femoral veins ([place]) 01/02/2021   Acute massive pulmonary embolism ([place]) 08/25/2020   Last Assessment & Plan:  Formatting of this note might be different from the original. Post vena caval filter and on xarelto and O2   Anemia    ARF (acute respiratory failure) ([place])    Cellulitis of left lower leg 06/20/2021   CHF (congestive heart failure)  ([place])    COVID-19    Diabetes mellitus without complication ([place])    DVT (deep venous thrombosis) ([place]) 09/06/2020   Endometrial cancer (HCC)    GAVE (gastric antral vascular ectasia)    GERD (gastroesophageal reflux disease)    High cholesterol    Hx of blood clots    Hyperlipidemia    Hypertension    IDA (iron deficiency anemia) 09/21/2020   Obesity    Pressure injury of skin 06/21/2021   Pulmonary embolism ([place])    Sepsis ([place])    Thrombocytopenia ([place])    Urinary tract infection 09/13/2021    Past Surgical History:  Procedure Laterality Date   COLONOSCOPY N/A 06/21/2021   Procedure: COLONOSCOPY;  Surgeon: Toledo, Benay Pike, MD;  Location: [facility] ENDOSCOPY;  Service: Gastroenterology;  Laterality: N/A;   CYSTOSCOPY W/ URETERAL STENT PLACEMENT Left 09/11/2021   Procedure: CYSTOSCOPY WITH RETROGRADE PYELOGRAM/URETERAL STENT PLACEMENT;  Surgeon: Abbie Sons, MD;  Location: [facility] ORS;  Service: Urology;  Laterality: Left;   CYSTOSCOPY W/ URETERAL STENT PLACEMENT Left 11/17/2021   Procedure: CYSTOSCOPY WITH STENT REPLACEMENT;  Surgeon: Irine Seal, MD;  Location: [facility] ORS;  Service: Urology;  Laterality: Left;   CYSTOSCOPY WITH FULGERATION  11/17/2021   Procedure: CYSTOSCOPY WITH FULGERATION;  Surgeon: Irine Seal, MD;  Location: [facility] ORS;  Service: Urology;;   CYSTOSCOPY WITH STENT PLACEMENT Left 10/17/2021   Procedure: CYSTOSCOPY WITH STENT PLACEMENT;  Surgeon: Abbie Sons, MD;  Location: [facility] ORS;  Service: Urology;  Laterality: Left;   CYSTOSCOPY WITH URETEROSCOPY, STONE BASKETRY  AND STENT PLACEMENT Left 10/16/2021   Procedure: CYSTOSCOPY WITH URETEROSCOPY  AND STENT REMOVAL;  Surgeon: Abbie Sons, MD;  Location: [facility] ORS;  Service: Urology;  Laterality: Left;   ENTEROSCOPY N/A 08/17/2021   Procedure: ENTEROSCOPY;  Surgeon: Lin Landsman, MD;  Location: [facility] ENDOSCOPY;  Service: Gastroenterology;  Laterality: N/A;   ESOPHAGOGASTRODUODENOSCOPY N/A 06/21/2021    Procedure: ESOPHAGOGASTRODUODENOSCOPY (EGD);  Surgeon: Toledo, Benay Pike, MD;  Location: [facility] ENDOSCOPY;  Service: Gastroenterology;  Laterality: N/A;   ESOPHAGOGASTRODUODENOSCOPY (EGD) WITH PROPOFOL N/A 08/17/2021   Procedure: ESOPHAGOGASTRODUODENOSCOPY (EGD) WITH PROPOFOL;  Surgeon: Lin Landsman, MD;  Location: [facility] ENDOSCOPY;  Service: Gastroenterology;  Laterality: N/A;   ESOPHAGOGASTRODUODENOSCOPY (EGD) WITH PROPOFOL N/A 10/14/2021   Procedure: ESOPHAGOGASTRODUODENOSCOPY (EGD) WITH PROPOFOL;  Surgeon: [name], MD;  Location: [facility] ENDOSCOPY;  Service: Endoscopy;  Laterality: N/A;   ESOPHAGOGASTRODUODENOSCOPY (EGD) WITH PROPOFOL N/A 02/01/2022   Procedure: ESOPHAGOGASTRODUODENOSCOPY (EGD) WITH PROPOFOL;  Surgeon: Lin Landsman, MD;  Location: [facility] ENDOSCOPY;  Service: Gastroenterology;  Laterality: N/A;   GIVENS CAPSULE STUDY N/A 06/22/2021   Procedure: GIVENS CAPSULE STUDY;  Surgeon: Toledo, Benay Pike, MD;  Location: [facility] ENDOSCOPY;  Service: Gastroenterology;  Laterality: N/A;   IR NEPHROSTOMY EXCHANGE LEFT  02/25/2022   IR NEPHROSTOMY PLACEMENT LEFT  12/27/2021   IVC FILTER INSERTION N/A 08/18/2020   Procedure: IVC FILTER INSERTION;  Surgeon: Katha Cabal, MD;  Location: [place] CV LAB;  Service: Cardiovascular;  Laterality: N/A;   PERIPHERAL VASCULAR THROMBECTOMY Bilateral 01/03/2021   Procedure: PERIPHERAL VASCULAR THROMBECTOMY;  Surgeon: Algernon Huxley, MD;  Location: [place] CV LAB;  Service: Cardiovascular;  Laterality: Bilateral;   PULMONARY THROMBECTOMY N/A 08/18/2020   Procedure: PULMONARY THROMBECTOMY;  Surgeon: Katha Cabal, MD;  Location: [place] [facility] LAB;  Service: Cardiovascular;  Laterality: N/A;   TUBAL LIGATION     VISCERAL ANGIOGRAPHY N/A 07/18/2021   Procedure: VISCERAL ANGIOGRAPHY;  Surgeon: Algernon Huxley, MD;  Location: [place] [facility];  Service: Cardiovascular;  Laterality: N/A;     reports that she has never smoked. She has never  used smokeless tobacco. She reports that she does not currently use alcohol. She reports that she does not currently use drugs.  No Known Allergies  Family History  Problem Relation Age of Onset   Hypertension Mother    Diabetes Mother    Diabetes Father    Hypertension Father    High Cholesterol Father    Congestive Heart Failure Father    Breast cancer Cousin     Prior to Admission medications   Medication Sig Start Date End Date Taking? Authorizing Provider  acetaminophen (TYLENOL) 500 MG tablet Take 500 mg by mouth every 6 (six) hours as needed for mild pain or fever.   Yes [provider]  Ascorbic Acid (VITAMIN C) 1000 MG tablet Take 1,000 mg by mouth daily.   Yes [provider]  cyanocobalamin 1000 MCG tablet Take 1 tablet (1,000 mcg total) by mouth daily. 02/03/22  Yes Fritzi Mandes, MD  feeding supplement (ENSURE ENLIVE / ENSURE PLUS) LIQD Take 237 mLs by mouth 3 (three) times daily between meals. 02/02/22  Yes Fritzi Mandes, MD  fluticasone furoate-vilanterol (BREO ELLIPTA) 100-25 MCG/ACT AEPB Inhale 1 puff into the lungs daily.   Yes [provider]  furosemide (LASIX) 20 MG tablet Hold due to low blood pressure. Patient taking differently: Take 20 mg by mouth daily. 12/31/21  Yes Enzo Bi, MD  loperamide (IMODIUM) 2 MG capsule Take  1 capsule (2 mg total) by mouth as needed for diarrhea or loose stools. 06/26/21  Yes Sreenath, Sudheer B, MD  mometasone-formoterol (DULERA) 100-5 MCG/ACT AERO Inhale 2 puffs into the lungs 2 (two) times daily.   Yes [provider]  Multiple Vitamin (MULTIVITAMIN WITH MINERALS) TABS tablet Take 1 tablet by mouth daily. 09/12/20  Yes [name], MD  pantoprazole (PROTONIX) 40 MG tablet Take 1 tablet (40 mg total) by mouth 2 (two) times daily. 03/01/22 04/30/22 Yes Sreenath, Sudheer B, MD  rivaroxaban (XARELTO) 20 MG TABS tablet Take 1 tablet (20 mg total) by mouth daily. Hold until followup with urology. Patient  taking differently: Take 20 mg by mouth daily with supper. 03/01/22 05/30/22 Yes Sreenath, Sudheer B, MD  rosuvastatin (CRESTOR) 10 MG tablet Take 1 tablet (10 mg total) by mouth daily. Patient taking differently: Take 10 mg by mouth in the morning. 10/03/20  Yes Earlie Server, MD    Review of Systems:  Review of Systems  Constitutional:  Positive for malaise/fatigue.  Gastrointestinal:  Positive for melena.  Genitourinary:  Positive for flank pain.     ED Course:   > Vitals:   03/12/22 1526 03/12/22 1527 03/12/22 1706  BP: (!) 104/46  108/63  Pulse: 94  93  Resp: 20  20  Temp: 98.3 F (36.8 C)  98.4 F (36.9 C)  TempSrc: Oral  Oral  SpO2: 97%  100%  Weight:  79.4 kg   Height:  '4\' 11"'$  (1.499 m)    > Vitals:   03/12/22 1526 03/12/22 1706  BP: (!) 104/46 108/63    >No intake/output data recorded. >No intake/output data recorded. >SpO2: 100 %  In the emergency room patient is in no distress but pale appearing with edema in her bilateral lower extremities. Patient's blood pressures are soft patient is written for 2 units PRBC for her anemia of 5.5. Emergency room I consulted GI Dr. Vicente Males evaluation Dr. Diamantina Providence. Normal kidney function. Discussed with nurse to not give saline maintenance fluid while transfusion to limit IV fluid intake secondary to patient's history of congestive heart failure.  Results for orders placed or performed during the hospital encounter of 03/12/22 (from the past 24 hour(s))  Comprehensive metabolic panel     Status: Abnormal   Collection Time: 03/12/22  3:30 PM  Result Value Ref Range   Sodium 140 135 - 145 mmol/L   Potassium 3.5 3.5 - 5.1 mmol/L   Chloride 101 98 - 111 mmol/L   CO2 28 22 - 32 mmol/L   Glucose, Bld 133 (H) 70 - 99 mg/dL   BUN 27 (H) 8 - 23 mg/dL   Creatinine, Ser 0.83 0.44 - 1.00 mg/dL   Calcium 9.4 8.9 - 10.3 mg/dL   Total Protein 7.1 6.5 - 8.1 g/dL   Albumin 3.6 3.5 - 5.0 g/dL   AST 18 15 - 41 U/L   ALT 11 0 - 44 U/L    Alkaline Phosphatase 43 38 - 126 U/L   Total Bilirubin 1.1 0.3 - 1.2 mg/dL   GFR, Estimated >60 >60 mL/min   Anion gap 11 5 - 15  Lactic acid, plasma     Status: None   Collection Time: 03/12/22  3:30 PM  Result Value Ref Range   Lactic Acid, Venous 1.3 0.5 - 1.9 mmol/L  CBC with Differential     Status: Abnormal   Collection Time: 03/12/22  3:30 PM  Result Value Ref Range   WBC 6.1 4.0 -  10.5 K/uL   RBC 2.32 (L) 3.87 - 5.11 MIL/uL   Hemoglobin 5.5 (L) 12.0 - 15.0 g/dL   HCT 19.5 (L) 36.0 - 46.0 %   MCV 84.1 80.0 - 100.0 fL   MCH 23.7 (L) 26.0 - 34.0 pg   MCHC 28.2 (L) 30.0 - 36.0 g/dL   RDW 18.5 (H) 11.5 - 15.5 %   Platelets 322 150 - 400 K/uL   nRBC 0.0 0.0 - 0.2 %   Neutrophils Relative % 73 %   Neutro Abs 4.4 1.7 - 7.7 K/uL   Lymphocytes Relative 13 %   Lymphs Abs 0.8 0.7 - 4.0 K/uL   Monocytes Relative 6 %   Monocytes Absolute 0.3 0.1 - 1.0 K/uL   Eosinophils Relative 7 %   Eosinophils Absolute 0.4 0.0 - 0.5 K/uL   Basophils Relative 1 %   Basophils Absolute 0.1 0.0 - 0.1 K/uL   Immature Granulocytes 0 %   Abs Immature Granulocytes 0.02 0.00 - 0.07 K/uL  Protime-INR     Status: Abnormal   Collection Time: 03/12/22  3:30 PM  Result Value Ref Range   Prothrombin Time 16.4 (H) 11.4 - 15.2 seconds   INR 1.3 (H) 0.8 - 1.2  Type and screen [place]     Status: None (Preliminary result)   Collection Time: 03/12/22  5:45 PM  Result Value Ref Range   ABO/RH(D) A POS    Antibody Screen POS    Sample Expiration      03/15/2022,2359 Performed at [facility], [address] [place], [place] [address]    Antibody Identification PENDING   Prepare RBC (crossmatch)     Status: None (Preliminary result)   Collection Time: 03/12/22  6:00 PM  Result Value Ref Range   Order Confirmation PENDING     Urine analysis: Pending.    EKG: Independently reviewed.  None  today.  2D echocardiogram-02/25/2022: 1. Left ventricular ejection fraction,  by estimation, is 65 to 70%. The  left ventricle has normal function. The left ventricle has no regional  wall motion abnormalities. Left ventricular diastolic parameters are  consistent with Grade I diastolic  dysfunction (impaired relaxation).   2. Right ventricular systolic function is normal. The right ventricular  size is normal.   3. The mitral valve is normal in structure. No evidence of mitral valve  regurgitation.   4. The aortic valve is normal in structure. Aortic valve regurgitation is  not visualized.   Radiological Exams on Admission: DG Chest 2 View  Result Date: 03/12/2022 CLINICAL DATA:  Sepsis. EXAM: CHEST - 2 VIEW COMPARISON:  February 23, 2022. FINDINGS: The heart size and mediastinal contours are within normal limits. Both lungs are clear. The visualized skeletal structures are unremarkable. IMPRESSION: No active cardiopulmonary disease. Electronically Signed   By: Marijo Conception M.D.   On: 03/12/2022 15:59     Physical Exam: Vitals:   03/12/22 1526 03/12/22 1527 03/12/22 1706  BP: (!) 104/46  108/63  Pulse: 94  93  Resp: 20  20  Temp: 98.3 F (36.8 C)  98.4 F (36.9 C)  TempSrc: Oral  Oral  SpO2: 97%  100%  Weight:  79.4 kg   Height:  '4\' 11"'$  (1.499 m)    Physical Exam Constitutional:      General: She is not in acute distress.    Appearance: She is obese. She is ill-appearing.  HENT:     Right Ear: External ear normal.  Left Ear: External ear normal.  Eyes:     Extraocular Movements: Extraocular movements intact.     Pupils: Pupils are equal, round, and reactive to light.  Cardiovascular:     Rate and Rhythm: Normal rate and regular rhythm.     Pulses: Normal pulses.  Pulmonary:     Effort: Pulmonary effort is normal.     Breath sounds: Normal breath sounds.  Abdominal:     General: Bowel sounds are normal. There is no distension.     Palpations: Abdomen is soft. There is no mass.     Tenderness: There is no abdominal tenderness. There is no  guarding.     Hernia: No hernia is present.  Musculoskeletal:     Right lower leg: Edema present.     Left lower leg: Edema present.  Neurological:     General: No focal deficit present.     Mental Status: She is oriented to person, place, and time.  Psychiatric:        Mood and Affect: Mood normal.        Behavior: Behavior normal.    Assessment and Plan: Bilateral hydronephrosis s/p left nephrostomy tube June 2023 cta abd for gib and hydronephrosis eval.  We will get urology consult and IR as deemed appropriate.   Symptomatic anemia    Latest Ref Rng & Units 03/12/2022    3:30 PM 03/01/2022    3:46 AM 02/28/2022    6:02 AM  CBC  WBC 4.0 - 10.5 K/uL 6.1  5.3  4.2   Hemoglobin 12.0 - 15.0 g/dL 5.5  9.1  8.2   Hematocrit 36.0 - 46.0 % 19.5  28.5  26.7   Platelets 150 - 400 K/uL 322  257  223    Transfuse 2 Unit  PRBC and  Monitor. NPO. GI consult: Dr.Anna.    Gastric antral vascular ectasia Pt has GAVE on EGD and GI consulted.  IV ppi infusion.    Acute on chronic right femoral DVT (deep venous thrombosis) (HCC) Currently pt is on xarelto which we will hold. Due to symptomatic anemia.     Chronic diastolic CHF (congestive heart failure) (HCC) Stable with LE edema . PRN lasix.       Unresulted Labs (From admission, onward)     Start     Ordered   03/12/22 1902  Urinalysis, Complete w Microscopic Urine, Random  Once,   URGENT        03/12/22 1901   03/12/22 1529  Lactic acid, plasma  Now then every 2 hours,   STAT      03/12/22 1528   03/12/22 1529  Culture, blood (Routine x 2)  BLOOD CULTURE X 2,   STAT      03/12/22 1528   03/12/22 1529  Urinalysis, Routine w reflex microscopic  Once,   URGENT        03/12/22 1528            DVT prophylaxis:  Compression stockings.   Code Status:  Full Code    Family Communication:  Johnny Bridge (Daughter)  [phone] (Mobile)   Disposition Plan:  TBD.   Consults called:  GI : Dr.Anna.  Urology:  Dr.Sninsky.   Admission status: Inpatient.    Unit: Stepdown.    Para Skeans MD [facility]  6 PM- 2 AM. Please contact me via secure Chat 6 PM-2 AM. [phone] ( Pager ) To contact the [facility] Attending or Consulting provider [place] or  covering provider during after hours [place], for this patient.   Check the care team in [facility] and look for a) attending/consulting [facility] provider listed and b) the [facility] team listed Log into [email or web address] and use [place]'s universal password to access. If you do not have the password, please contact the hospital operator. Locate the [facility] provider you are looking for under [facility] and page to a number that you can be directly reached. If you still have difficulty reaching the provider, please page the [facility] (Director on Call) for the Hospitalists listed on amion for assistance. [email or web address] 03/12/2022, 7:10 PM

## 2022-03-12 NOTE — ED Provider Triage Note (Signed)
Emergency Medicine Provider Triage Evaluation Note  Courtney Mack , a 66 y.o. female  was evaluated in triage.  Pt complains of weakness, decreased urine output.  Review of Systems  Positive:  Negative:   Physical Exam  BP (!) 104/46 (BP Location: Right Arm)   Pulse 94   Temp 98.3 F (36.8 C) (Oral)   Resp 20   Ht '4\' 11"'$  (1.499 m)   Wt 79.4 kg   SpO2 97%   BMI 35.35 kg/m  Gen:   Awake, no distress   Resp:  Normal effort  MSK:   Moves extremities without difficulty  Other:    Medical Decision Making  Medically screening exam initiated at 3:27 PM.  Appropriate orders placed.  Courtney Mack was informed that the remainder of the evaluation will be completed by another provider, this initial triage assessment does not replace that evaluation, and the importance of remaining in the ED until their evaluation is complete.  Patient appears to be pale, blood pressure is a little low, instructed nursing staff to try and obtain a bed on the major side if not she should go to Courtney Mack, start sepsis protocols   Courtney Starks, PA-C 03/12/22 1528

## 2022-03-12 NOTE — Assessment & Plan Note (Signed)
Stable with LE edema . PRN lasix.

## 2022-03-12 NOTE — Assessment & Plan Note (Signed)
cta abd for gib and hydronephrosis eval.  We will get urology consult and IR as deemed appropriate.

## 2022-03-12 NOTE — Assessment & Plan Note (Addendum)
Home Xarelto is being held.

## 2022-03-12 NOTE — Assessment & Plan Note (Addendum)
    Latest Ref Rng & Units 03/12/2022    3:30 PM 03/01/2022    3:46 AM 02/28/2022    6:02 AM  CBC  WBC 4.0 - 10.5 K/uL 6.1  5.3  4.2   Hemoglobin 12.0 - 15.0 g/dL 5.5  9.1  8.2   Hematocrit 36.0 - 46.0 % 19.5  28.5  26.7   Platelets 150 - 400 K/uL 322  257  223    Transfuse 2 Unit  PRBC and  Monitor. NPO. GI consult: Dr.Anna.

## 2022-03-13 ENCOUNTER — Inpatient Hospital Stay: Payer: Medicaid Other | Admitting: Oncology

## 2022-03-13 ENCOUNTER — Inpatient Hospital Stay: Payer: Medicaid Other | Attending: Nurse Practitioner

## 2022-03-13 ENCOUNTER — Inpatient Hospital Stay: Payer: Medicaid Other

## 2022-03-13 DIAGNOSIS — K921 Melena: Secondary | ICD-10-CM

## 2022-03-13 DIAGNOSIS — D649 Anemia, unspecified: Secondary | ICD-10-CM

## 2022-03-13 LAB — CBC
HCT: 21.8 % — ABNORMAL LOW (ref 36.0–46.0)
Hemoglobin: 6.6 g/dL — ABNORMAL LOW (ref 12.0–15.0)
MCH: 25.9 pg — ABNORMAL LOW (ref 26.0–34.0)
MCHC: 30.3 g/dL (ref 30.0–36.0)
MCV: 85.5 fL (ref 80.0–100.0)
Platelets: 228 10*3/uL (ref 150–400)
RBC: 2.55 MIL/uL — ABNORMAL LOW (ref 3.87–5.11)
RDW: 18.2 % — ABNORMAL HIGH (ref 11.5–15.5)
WBC: 4.6 10*3/uL (ref 4.0–10.5)
nRBC: 0 % (ref 0.0–0.2)

## 2022-03-13 LAB — HEMOGLOBIN AND HEMATOCRIT, BLOOD
HCT: 27.4 % — ABNORMAL LOW (ref 36.0–46.0)
Hemoglobin: 8.4 g/dL — ABNORMAL LOW (ref 12.0–15.0)

## 2022-03-13 LAB — PREPARE RBC (CROSSMATCH)

## 2022-03-13 MED ORDER — SODIUM CHLORIDE 0.9% IV SOLUTION
Freq: Once | INTRAVENOUS | Status: DC
  Filled 2022-03-13: qty 250

## 2022-03-13 MED ORDER — VITAMIN B-12 1000 MCG PO TABS
1000.0000 ug | ORAL_TABLET | Freq: Every day | ORAL | Status: DC
Start: 2022-03-13 — End: 2022-03-16
  Administered 2022-03-13 – 2022-03-16 (×4): 1000 ug via ORAL
  Filled 2022-03-13 (×5): qty 1

## 2022-03-13 MED ORDER — ROSUVASTATIN CALCIUM 10 MG PO TABS
10.0000 mg | ORAL_TABLET | Freq: Every morning | ORAL | Status: DC
  Administered 2022-03-14 – 2022-03-16 (×3): 10 mg via ORAL
  Filled 2022-03-13 (×3): qty 1

## 2022-03-13 MED ORDER — ADULT MULTIVITAMIN W/MINERALS CH
1.0000 | ORAL_TABLET | Freq: Every day | ORAL | Status: DC
  Administered 2022-03-13 – 2022-03-16 (×4): 1 via ORAL
  Filled 2022-03-13 (×4): qty 1

## 2022-03-13 MED ORDER — ENSURE ENLIVE PO LIQD
237.0000 mL | Freq: Three times a day (TID) | ORAL | Status: DC
  Administered 2022-03-14 – 2022-03-16 (×4): 237 mL via ORAL

## 2022-03-13 NOTE — ED Notes (Signed)
Per blood bank, pt's allocated units won't be delivered until 0745

## 2022-03-13 NOTE — ED Notes (Signed)
Lab requests notification if further orders for blood transfusion are place since blood will be ordered from CLT. Primary RN Abram Sander informed.

## 2022-03-13 NOTE — ED Notes (Signed)
Pharmacy Tech notified of need for B12 and that it has not been received.

## 2022-03-13 NOTE — Progress Notes (Signed)
Progress Note   Patient: Courtney Mack NFA:[id number] DOB: 10/09/1955 DOA: 03/12/2022     1 DOS: the patient was seen and examined on 03/13/2022   Brief hospital course: ARIELLA VOIT is a 66 y.o. year old female with PMH recurrent septic shock associated with left hydronephrosis without obstructing stone managed with left nephrostomy tube most recently exchanged by IR on  02/25/2022, DVT on Xarelto, GI bleed, endometrial cancer, heart failure, iron deficiency anemia, and recurrent UTIs who presented to the ED overnight with reports of weakness and nondraining left nephrostomy tube.    Her left hydronephrosis was previously managed with an indwelling left ureteral stent, however this was externalized to a nephrostomy tube in the setting of persistent hematuria and blood loss anemia.  ED course.  Hemodynamically stable.  Hemoglobin was found to be at 5.5, she was admitted for concern of GI bleed.  Received 2 unit of PRBC with improvement of hemoglobin to 6.6, at that point overnight provider ordered third unit. Admission labs also notable for UA with 6-10 RBCs/hpf, 11-20 WBCs/hpf, and rare bacteria; blood culture is pending with no growth at <24 hours; lactate 1.3; creatinine 0.83 (at baseline); and WBC count 6.1.  No urine culture sent.  Less likely a UTI most likely secondary to chronic irritation with nephrostomy tube. CT angio GI bleed yesterday showed appropriate position of the left nephrostomy tube without hydronephrosis.  There is a vague urothelial thickening, likely inflammatory, on the left.  Urology and gastroenterology was consulted.  9/6: Patient with chronic GI bleed.  GI will repeat EGD on Friday as her last dose of Xarelto was on 03/12/2022. Urology was consulted for concern of malfunctioning of nephrostomy tube, it was found to be draining well and no intervention needed at this time.           Assessment and Plan: * Symptomatic anemia History of GAVE on prior  EGD. History of chronic GI blood loss, currently on Xarelto with last dose on 03/12/2022. Received 2 unit of PRBC with improvement of hemoglobin to 6.6, third unit is in progress. GI was consulted-will repeat EGD on Friday. -Monitor hemoglobin -Transfuse if below 7   Gastric antral vascular ectasia Pt has GAVE on EGD and GI consulted.  IV ppi infusion.    Bilateral hydronephrosis s/p left nephrostomy tube June 2023 CTA abdomen for GI bleed with no concern of persistent hydronephrosis. Urology was consulted for concern of malfunctioning of nephrostomy tube, it was functioning fine and no intervention needed. -Current continue to monitor  Acute on chronic right femoral DVT (deep venous thrombosis) ([place]) Home Xarelto is being held.     Chronic diastolic CHF (congestive heart failure) (HCC) Stable with LE edema . PRN lasix.    Morbidly obese ([place]) Estimated body mass index is 35.35 kg/m as calculated from the following:   Height as of this encounter: '4\' 11"'$  (1.499 m).   Weight as of this encounter: 79.4 kg.   -This will complicate overall prognosis.  Endometrial cancer ([place]) - Outpatient follow-up  HLD (hyperlipidemia) - Continue home Crestor   Subjective: Patient was seen and examined today.  Did not had any bowel movement since in the hospital.  History of black-colored stools for the past 4 to 5 months.  Still believe that nephrostomy tube is not working despite reassurance.  Physical Exam: Vitals:   03/13/22 1017 03/13/22 1034 03/13/22 1117 03/13/22 1259  BP: (!) 91/56 (!) 101/58 108/65 (!) 106/59  Pulse: 61 65 66 67  Resp:  $'16 18 19 16  'I$ Temp: 98 F (36.7 C) 98.1 F (36.7 C) 97.8 F (36.6 C) 98 F (36.7 C)  TempSrc: Oral Oral  Oral  SpO2: 100% 98% 99% 97%  Weight:      Height:       General.obese lady, in no acute distress. Pulmonary.  Lungs clear bilaterally, normal respiratory effort. CV.  Regular rate and rhythm, no JVD, rub or murmur. Abdomen.  Soft,  nontender, nondistended, BS positive.  Nephrostomy tube with clear urine. CNS.  Alert and oriented .  No focal neurologic deficit. Extremities.  2+ LE edema, lymphedema and signs of chronic venous congestion. Psychiatry.  Appears to have some cognitive impairment.  Data Reviewed: Prior data reviewed  Family Communication: Called daughter with no response, no message left due to generic mailbox.  Disposition: Status is: Inpatient Remains inpatient appropriate because: Severity of illness   Planned Discharge Destination: Home  Time spent: 50 minutes  This record has been created using Systems analyst. Errors have been sought and corrected,but may not always be located. Such creation errors do not reflect on the standard of care.  Author: Lorella Nimrod, MD 03/13/2022 4:28 PM  For on call review [email or web address].

## 2022-03-13 NOTE — Assessment & Plan Note (Signed)
Estimated body mass index is 35.35 kg/m as calculated from the following:   Height as of this encounter: '4\' 11"'$  (1.499 m).   Weight as of this encounter: 79.4 kg.   -This will complicate overall prognosis.

## 2022-03-13 NOTE — Progress Notes (Signed)
       CROSS COVER NOTE  NAME: Courtney Mack MRN: [id number] DOB : [birth year]    Date of Service   03/13/2022   HPI/Events of Note   HGB 6.6 this morning after receiving 2U PRBCs  Interventions   Plan: Transfuse 1 U PRBC--> blood must come from [facility] and will arrive at ~0745.    This document was prepared using Dragon voice recognition software and may include unintentional dictation errors.  Neomia Glass DNP, MHA, FNP-BC Nurse Practitioner [facility] Pager [phone]

## 2022-03-13 NOTE — ED Notes (Signed)
Pt alert, prbc's infusing.  Nsr on monitor.

## 2022-03-13 NOTE — Assessment & Plan Note (Signed)
-   Continue home Crestor °

## 2022-03-13 NOTE — Consult Note (Signed)
Courtney Mack , MD [address], [place], [place], [place], [address] [address], [place], [place], [place], [address] Phone: [phone]  Fax: [phone]  Consultation  Referring Provider:    Dr. Posey Pronto Primary Care Physician:  [facility] Primary Gastroenterologist:  Dr. Alice Reichert         Reason for Consultation:     Anemia  Date of Admission:  03/12/2022 Date of Consultation:  03/13/2022         HPI:   ALISABETH Mack is a 66 y.o. female with had multiple hospitalizations for acute blood loss anemia, history of iron deficiency anemia PE and DVT on Xarelto, endometrial cancer, history of GAVE presented to the ER in April , July August 21 had undergone EGD colonoscopy with GAVE treated with APC.  Received IV iron with hematology.  In July hemoglobin of 4.7 g.  Had B12 deficiency at that time.  Last EGD was in July Dr. Marius Ditch showed gave treated with APC. Admitted last night with weakness.  Was having black-colored stools for the past 4-5 days, has not had any vomiting , no abdominal pain , last dose of Xarelto was yesterday , not had a bowel movement today , last was yesterday .  Patient has an IVC filter.  On admission hemoglobin of 5.5 g with an MCV of 84, INR 1.3 CMP showed no gross abnormality.  No other use of NSAID's , no other complaints.   Past Medical History:  Diagnosis Date   Acute bilateral deep vein thrombosis (DVT) of femoral veins ([place]) 01/02/2021   Acute massive pulmonary embolism ([place]) 08/25/2020   Last Assessment & Plan:  Formatting of this note might be different from the original. Post vena caval filter and on xarelto and O2   Anemia    ARF (acute respiratory failure) ([place])    Cellulitis of left lower leg 06/20/2021   CHF (congestive heart failure) ([place])    COVID-19    Diabetes mellitus without complication ([place])    DVT (deep venous thrombosis) ([place]) 09/06/2020   Endometrial cancer ([facility])    GAVE (gastric antral vascular ectasia)     GERD (gastroesophageal reflux disease)    High cholesterol    Hx of blood clots    Hyperlipidemia    Hypertension    IDA (iron deficiency anemia) 09/21/2020   Obesity    Pressure injury of skin 06/21/2021   Pulmonary embolism ([place])    Sepsis ([place])    Thrombocytopenia ([place])    Urinary tract infection 09/13/2021    Past Surgical History:  Procedure Laterality Date   COLONOSCOPY N/A 06/21/2021   Procedure: COLONOSCOPY;  Surgeon: Toledo, Benay Pike, MD;  Location: [facility] ENDOSCOPY;  Service: Gastroenterology;  Laterality: N/A;   CYSTOSCOPY W/ URETERAL STENT PLACEMENT Left 09/11/2021   Procedure: CYSTOSCOPY WITH RETROGRADE PYELOGRAM/URETERAL STENT PLACEMENT;  Surgeon: Abbie Sons, MD;  Location: [facility] ORS;  Service: Urology;  Laterality: Left;   CYSTOSCOPY W/ URETERAL STENT PLACEMENT Left 11/17/2021   Procedure: CYSTOSCOPY WITH STENT REPLACEMENT;  Surgeon: Irine Seal, MD;  Location: [facility] ORS;  Service: Urology;  Laterality: Left;   CYSTOSCOPY WITH FULGERATION  11/17/2021   Procedure: CYSTOSCOPY WITH FULGERATION;  Surgeon: Irine Seal, MD;  Location: [facility] ORS;  Service: Urology;;   CYSTOSCOPY WITH STENT PLACEMENT Left 10/17/2021   Procedure: CYSTOSCOPY WITH STENT PLACEMENT;  Surgeon: Abbie Sons, MD;  Location: [facility] ORS;  Service: Urology;  Laterality: Left;   CYSTOSCOPY WITH URETEROSCOPY, STONE BASKETRY  AND STENT PLACEMENT Left 10/16/2021   Procedure: CYSTOSCOPY WITH URETEROSCOPY  AND STENT REMOVAL;  Surgeon: Abbie Sons, MD;  Location: [facility] ORS;  Service: Urology;  Laterality: Left;   ENTEROSCOPY N/A 08/17/2021   Procedure: ENTEROSCOPY;  Surgeon: Lin Landsman, MD;  Location: [facility] ENDOSCOPY;  Service: Gastroenterology;  Laterality: N/A;   ESOPHAGOGASTRODUODENOSCOPY N/A 06/21/2021   Procedure: ESOPHAGOGASTRODUODENOSCOPY (EGD);  Surgeon: Toledo, Benay Pike, MD;  Location: [facility] ENDOSCOPY;  Service: Gastroenterology;  Laterality: N/A;   ESOPHAGOGASTRODUODENOSCOPY (EGD) WITH PROPOFOL  N/A 08/17/2021   Procedure: ESOPHAGOGASTRODUODENOSCOPY (EGD) WITH PROPOFOL;  Surgeon: Lin Landsman, MD;  [facility] ENDOSCOPY;  Service: Gastroenterology;  Laterality: N/A;   ESOPHAGOGASTRODUODENOSCOPY (EGD) WITH PROPOFOL N/A 10/14/2021   Procedure: ESOPHAGOGASTRODUODENOSCOPY (EGD) WITH PROPOFOL;  Surgeon: [name], MD;  Location: [facility] ENDOSCOPY;  Service: Endoscopy;  Laterality: N/A;   ESOPHAGOGASTRODUODENOSCOPY (EGD) WITH PROPOFOL N/A 02/01/2022   Procedure: ESOPHAGOGASTRODUODENOSCOPY (EGD) WITH PROPOFOL;  Surgeon: Lin Landsman, MD;  Location: [facility] ENDOSCOPY;  Service: Gastroenterology;  Laterality: N/A;   GIVENS CAPSULE STUDY N/A 06/22/2021   Procedure: GIVENS CAPSULE STUDY;  Surgeon: Toledo, Benay Pike, MD;  Location: [facility] ENDOSCOPY;  Service: Gastroenterology;  Laterality: N/A;   IR NEPHROSTOMY EXCHANGE LEFT  02/25/2022   IR NEPHROSTOMY PLACEMENT LEFT  12/27/2021   IVC FILTER INSERTION N/A 08/18/2020   Procedure: IVC FILTER INSERTION;  Surgeon: Katha Cabal, MD;  Location: [place] [facility] CV LAB;  Service: Cardiovascular;  Laterality: N/A;   PERIPHERAL VASCULAR THROMBECTOMY Bilateral 01/03/2021   Procedure: PERIPHERAL VASCULAR THROMBECTOMY;  Surgeon: Algernon Huxley, MD;  Location: [place] CV LAB;  Service: Cardiovascular;  Laterality: Bilateral;   PULMONARY THROMBECTOMY N/A 08/18/2020   Procedure: PULMONARY THROMBECTOMY;  Surgeon: Katha Cabal, MD;  Location: [place] CV LAB;  Service: Cardiovascular;  Laterality: N/A;   TUBAL LIGATION     VISCERAL ANGIOGRAPHY N/A 07/18/2021   Procedure: VISCERAL ANGIOGRAPHY;  Surgeon: Algernon Huxley, MD;  Location: [place] CV LAB;  Service: Cardiovascular;  Laterality: N/A;    Prior to Admission medications   Medication Sig Start Date End Date Taking? Authorizing Provider  acetaminophen (TYLENOL) 500 MG tablet Take 500 mg by mouth every 6 (six) hours as needed for mild pain or fever.   Yes [provider]   Ascorbic Acid (VITAMIN C) 1000 MG tablet Take 1,000 mg by mouth daily.   Yes [provider]  cyanocobalamin 1000 MCG tablet Take 1 tablet (1,000 mcg total) by mouth daily. 02/03/22  Yes Fritzi Mandes, MD  feeding supplement (ENSURE ENLIVE / ENSURE PLUS) LIQD Take 237 mLs by mouth 3 (three) times daily between meals. 02/02/22  Yes Fritzi Mandes, MD  fluticasone furoate-vilanterol (BREO ELLIPTA) 100-25 MCG/ACT AEPB Inhale 1 puff into the lungs daily.   Yes [provider]  furosemide (LASIX) 20 MG tablet Hold due to low blood pressure. Patient taking differently: Take 20 mg by mouth daily. 12/31/21  Yes Enzo Bi, MD  loperamide (IMODIUM) 2 MG capsule Take 1 capsule (2 mg total) by mouth as needed for diarrhea or loose stools. 06/26/21  Yes Sreenath, Sudheer B, MD  mometasone-formoterol (DULERA) 100-5 MCG/ACT AERO Inhale 2 puffs into the lungs 2 (two) times daily.   Yes [provider]  Multiple Vitamin (MULTIVITAMIN WITH MINERALS) TABS tablet Take 1 tablet by mouth daily. 09/12/20  Yes [name], MD  pantoprazole (PROTONIX) 40 MG tablet Take 1 tablet (40 mg total) by mouth 2 (two) times daily. 03/01/22 04/30/22 Yes Sreenath,  Sudheer B, MD  rivaroxaban (XARELTO) 20 MG TABS tablet Take 1 tablet (20 mg total) by mouth daily. Hold until followup with urology. Patient taking differently: Take 20 mg by mouth daily with supper. 03/01/22 05/30/22 Yes Sreenath, Sudheer B, MD  rosuvastatin (CRESTOR) 10 MG tablet Take 1 tablet (10 mg total) by mouth daily. Patient taking differently: Take 10 mg by mouth in the morning. 10/03/20  Yes Earlie Server, MD    Family History  Problem Relation Age of Onset   Hypertension Mother    Diabetes Mother    Diabetes Father    Hypertension Father    High Cholesterol Father    Congestive Heart Failure Father    Breast cancer Cousin      Social History   Tobacco Use   Smoking status: Never   Smokeless tobacco: Never   Tobacco comments:     smoked 2-4 cigarettes for about 2-3 weeks   Vaping Use   Vaping Use: Never used  Substance Use Topics   Alcohol use: Not Currently   Drug use: Not Currently    Allergies as of 03/12/2022   (No Known Allergies)    Review of Systems:    All systems reviewed and negative except where noted in HPI.   Physical Exam:  Vital signs in last 24 hours: Temp:  [97.8 F (36.6 C)-98.4 F (36.9 C)] 97.9 F (36.6 C) (09/06 0720) Pulse Rate:  [66-96] 70 (09/06 0720) Resp:  [14-23] 18 (09/06 0720) BP: (89-115)/(46-67) 96/56 (09/06 0720) SpO2:  [97 %-100 %] 100 % (09/06 0720) Weight:  [79.4 kg] 79.4 kg (09/05 1527)   General:   Pleasant, cooperative in NAD Head:  Normocephalic and atraumatic. Eyes:   No icterus.   Conjunctiva pink. PERRLA. Ears:  Normal auditory acuity. Neck:  Supple; no masses or thyroidomegaly Lungs: Respirations even and unlabored. Lungs clear to auscultation bilaterally.   No wheezes, crackles, or rhonchi.  Heart:  Regular rate and rhythm;  Without murmur, clicks, rubs or gallops Abdomen:  Soft, nondistended, nontender. Normal bowel sounds. No appreciable masses or hepatomegaly.  No rebound or guarding.  Neurologic:  Alert and oriented x3;  grossly normal neurologically.Marland Kitchen Psych:  Alert and cooperative. Normal affect.  LAB RESULTS: Recent Labs    03/12/22 1530 03/13/22 0353  WBC 6.1 4.6  HGB 5.5* 6.6*  HCT 19.5* 21.8*  PLT 322 228   BMET Recent Labs    03/12/22 1530  NA 140  K 3.5  CL 101  CO2 28  GLUCOSE 133*  BUN 27*  CREATININE 0.83  CALCIUM 9.4   LFT Recent Labs    03/12/22 1530  PROT 7.1  ALBUMIN 3.6  AST 18  ALT 11  ALKPHOS 43  BILITOT 1.1   PT/INR Recent Labs    03/12/22 1530  LABPROT 16.4*  INR 1.3*    STUDIES: CT ANGIO GI BLEED  Result Date: 03/12/2022 CLINICAL DATA:  Lower GI bleed (Ped 0-17y). Pt here with weakness and c/o her urinary bag not filling like it usually does. Pt also has back pain and states she just does not  feel well EXAM: CTA ABDOMEN AND PELVIS WITHOUT AND WITH CONTRAST TECHNIQUE: Multidetector CT imaging of the abdomen and pelvis was performed using the standard protocol during bolus administration of intravenous contrast. Multiplanar reconstructed images and MIPs were obtained and reviewed to evaluate the vascular anatomy. RADIATION DOSE REDUCTION: This exam was performed according to the departmental dose-optimization program which includes automated exposure control, adjustment of the  mA and/or kV according to patient size and/or use of iterative reconstruction technique. CONTRAST:  124m OMNIPAQUE IOHEXOL 350 MG/ML SOLN COMPARISON:  None Available. FINDINGS: VASCULAR No extravasation of contrast within the gastrointestinal tract to suggest active bleed. Aorta: Normal caliber aorta without aneurysm, dissection, vasculitis or significant stenosis. Celiac: Patent without evidence of aneurysm, dissection, vasculitis or significant stenosis. SMA: Patent without evidence of aneurysm, dissection, vasculitis or significant stenosis. Renals: Both renal arteries are patent without evidence of aneurysm, dissection, vasculitis, fibromuscular dysplasia or significant stenosis. IMA: Patent without evidence of aneurysm, dissection, vasculitis or significant stenosis. Inflow: Inferior vena cava filter noted in grossly appropriate position. The main portal, splenic, superior mesenteric veins are patent. No definite thrombus within the IVC filter. Proximal Outflow: Bilateral common femoral and visualized portions of the superficial and profunda femoral arteries are patent without evidence of aneurysm, dissection, vasculitis or significant stenosis. Veins: No obvious venous abnormality within the limitations of this arterial phase study. Review of the MIP images confirms the above findings. NON-VASCULAR Lower chest: No acute abnormality. Hepatobiliary: No focal liver abnormality. No gallstones, gallbladder wall thickening, or  pericholecystic fluid. No biliary dilatation. Pancreas: No focal lesion. Normal pancreatic contour. No surrounding inflammatory changes. No main pancreatic ductal dilatation. Spleen: Normal in size without focal abnormality. Adrenals/Urinary Tract: No adrenal nodule bilaterally. Percutaneous nephrostomy tube on the left with pigtail terminating within the left renal pelvis. Vague urothelial thickening associated with this likely due to inflammation in the setting of hardware. Bilateral kidneys enhance symmetrically. Punctate right nephrolithiasis. No ureterolithiasis bilaterally. No left nephrolithiasis. No hydronephrosis. No hydroureter. The urinary bladder is decompressed with circumferential urinary bladder wall thickening. Vague perivesicular fat stranding. Stomach/Bowel: Stomach is within normal limits. No evidence of bowel wall thickening or dilatation. Appendix appears normal. Lymphatic: Asymmetric left periaortic prominent lymph nodes. No lymphadenopathy. Reproductive: Uterus and bilateral adnexa are unremarkable. Other: No intraperitoneal free fluid. No intraperitoneal free gas. No organized fluid collection. Musculoskeletal: Subcutaneus soft tissue edema. No suspicious lytic or blastic osseous lesions. No acute displaced fracture. Grade 1 anterolisthesis of L5 on S1. Bilateral severe hip degenerative changes. IMPRESSION: VASCULAR 1. No CT evidence of active bleed. 2. No acute abdominal aorta abnormality. 3.  Aortic Atherosclerosis (ICD10-I70.0). NON-VASCULAR 1. Left percutaneous nephrostomy tube in appropriate position with associated vague urothelial thickening as well as mild perivesicular fat stranding. Recommend correlation with urinalysis to exclude superimposed infection. 2. Punctate nonobstructive right nephrolithiasis. Electronically Signed   By: MIven FinnM.D.   On: 03/12/2022 20:13   DG Chest 2 View  Result Date: 03/12/2022 CLINICAL DATA:  Sepsis. EXAM: CHEST - 2 VIEW COMPARISON:  February 23, 2022. FINDINGS: The heart size and mediastinal contours are within normal limits. Both lungs are clear. The visualized skeletal structures are unremarkable. IMPRESSION: No active cardiopulmonary disease. Electronically Signed   By: JMarijo ConceptionM.D.   On: 03/12/2022 15:59      Impression / Plan:   KKEYONTA MADRIDis a 66y.o. y/o female with a history of recurrent GI bleeds hospital admission on blood thinners for DVT and PE readmitted with melena has had multiple upper endoscopies, push enteroscopy's with APC of GAVE with recurrent bleeding on IV iron and B12 as an outpatient.  Readmitted with melena and severe anemia.  Plan 1.  Monitor CBC transfuse to maintain hemoglobin greater than 7 gm ( presently 6.6) and 2 days off Xarelto we will perform EGD on Friday .  The patient has had multiple upper endoscopies with  APC and likely has failed.  Would recommend as an outpatient to consider cryotherapy when she sees her gastroenterologist at [facility].  In addition continue IV iron  Would also recommend a capsule study as an outpatient.     Thank you for involving me in the care of this patient.      LOS: 1 day   Courtney Bellows, MD  03/13/2022, 8:30 AM

## 2022-03-13 NOTE — Consult Note (Signed)
Urology Consult  I have been asked to see the patient by Dr. Reesa Chew, for evaluation and management of nephrostomy tube dysfunction.  Chief Complaint: Weakness, nondraining left nephrostomy tube  History of Present Illness: Courtney Mack is a 66 y.o. year old female with PMH recurrent septic shock associated with left hydronephrosis without obstructing stone managed with left nephrostomy tube most recently exchanged by IR on  02/25/2022, DVT, PE on Xarelto, GI bleed, endometrial cancer, heart failure, iron deficiency anemia, and recurrent UTIs who presented to the ED overnight with reports of weakness and nondraining left nephrostomy tube.  She is being admitted for symptomatic anemia due to GI bleed.   CT angio GI bleed yesterday showed appropriate position of the left nephrostomy tube without hydronephrosis.  There is a vague urothelial thickening, likely inflammatory, on the left.  Admission labs notable for UA with 6-10 RBCs/hpf, 11-20 WBCs/hpf, and rare bacteria; blood culture is pending with no growth at <24 hours; lactate 1.3; creatinine 0.83 (at baseline); and WBC count 6.1.  No urine culture sent.  Her left hydronephrosis was previously managed with an indwelling left ureteral stent, however this was externalized to a nephrostomy tube in the setting of persistent hematuria and blood loss anemia.  Nephrostomy tube is draining clear, yellow urine this morning.  She reports some insertion site discomfort.  Past Medical History:  Diagnosis Date   Acute bilateral deep vein thrombosis (DVT) of femoral veins ([place]) 01/02/2021   Acute massive pulmonary embolism ([place]) 08/25/2020   Last Assessment & Plan:  Formatting of this note might be different from the original. Post vena caval filter and on xarelto and O2   Anemia    ARF (acute respiratory failure) ([place])    Cellulitis of left lower leg 06/20/2021   CHF (congestive heart failure) ([place])    COVID-19    Diabetes mellitus without  complication ([place])    DVT (deep venous thrombosis) ([place]) 09/06/2020   Endometrial cancer (HCC)    GAVE (gastric antral vascular ectasia)    GERD (gastroesophageal reflux disease)    High cholesterol    Hx of blood clots    Hyperlipidemia    Hypertension    IDA (iron deficiency anemia) 09/21/2020   Obesity    Pressure injury of skin 06/21/2021   Pulmonary embolism ([place])    Sepsis ([place])    Thrombocytopenia ([place])    Urinary tract infection 09/13/2021    Past Surgical History:  Procedure Laterality Date   COLONOSCOPY N/A 06/21/2021   Procedure: COLONOSCOPY;  Surgeon: Toledo, Benay Pike, MD;  Location: [facility] ENDOSCOPY;  Service: Gastroenterology;  Laterality: N/A;   CYSTOSCOPY W/ URETERAL STENT PLACEMENT Left 09/11/2021   Procedure: CYSTOSCOPY WITH RETROGRADE PYELOGRAM/URETERAL STENT PLACEMENT;  Surgeon: Abbie Sons, MD;  Location: [facility] ORS;  Service: Urology;  Laterality: Left;   CYSTOSCOPY W/ URETERAL STENT PLACEMENT Left 11/17/2021   Procedure: CYSTOSCOPY WITH STENT REPLACEMENT;  Surgeon: Irine Seal, MD;  Location: [facility] ORS;  Service: Urology;  Laterality: Left;   CYSTOSCOPY WITH FULGERATION  11/17/2021   Procedure: CYSTOSCOPY WITH FULGERATION;  Surgeon: Irine Seal, MD;  Location: [facility] ORS;  Service: Urology;;   CYSTOSCOPY WITH STENT PLACEMENT Left 10/17/2021   Procedure: CYSTOSCOPY WITH STENT PLACEMENT;  Surgeon: Abbie Sons, MD;  Location: [facility] ORS;  Service: Urology;  Laterality: Left;   CYSTOSCOPY WITH URETEROSCOPY, STONE BASKETRY AND STENT PLACEMENT Left 10/16/2021   Procedure: CYSTOSCOPY WITH URETEROSCOPY  AND STENT REMOVAL;  Surgeon: Abbie Sons, MD;  Location:  [facility] ORS;  Service: Urology;  Laterality: Left;   ENTEROSCOPY N/A 08/17/2021   Procedure: ENTEROSCOPY;  Surgeon: Lin Landsman, MD;  Location: [facility] ENDOSCOPY;  Service: Gastroenterology;  Laterality: N/A;   ESOPHAGOGASTRODUODENOSCOPY N/A 06/21/2021   Procedure: ESOPHAGOGASTRODUODENOSCOPY (EGD);  Surgeon:  Toledo, Benay Pike, MD;  Location: [facility] ENDOSCOPY;  Service: Gastroenterology;  Laterality: N/A;   ESOPHAGOGASTRODUODENOSCOPY (EGD) WITH PROPOFOL N/A 08/17/2021   Procedure: ESOPHAGOGASTRODUODENOSCOPY (EGD) WITH PROPOFOL;  Surgeon: Lin Landsman, MD;  Location: [facility] ENDOSCOPY;  Service: Gastroenterology;  Laterality: N/A;   ESOPHAGOGASTRODUODENOSCOPY (EGD) WITH PROPOFOL N/A 10/14/2021   Procedure: ESOPHAGOGASTRODUODENOSCOPY (EGD) WITH PROPOFOL;  Surgeon: [name], MD;  Location: [facility] ENDOSCOPY;  Service: Endoscopy;  Laterality: N/A;   ESOPHAGOGASTRODUODENOSCOPY (EGD) WITH PROPOFOL N/A 02/01/2022   Procedure: ESOPHAGOGASTRODUODENOSCOPY (EGD) WITH PROPOFOL;  Surgeon: Lin Landsman, MD;  Location: [facility] ENDOSCOPY;  Service: Gastroenterology;  Laterality: N/A;   GIVENS CAPSULE STUDY N/A 06/22/2021   Procedure: GIVENS CAPSULE STUDY;  Surgeon: Toledo, Benay Pike, MD;  Location: [facility] ENDOSCOPY;  Service: Gastroenterology;  Laterality: N/A;   IR NEPHROSTOMY EXCHANGE LEFT  02/25/2022   IR NEPHROSTOMY PLACEMENT LEFT  12/27/2021   IVC FILTER INSERTION N/A 08/18/2020   Procedure: IVC FILTER INSERTION;  Surgeon: Katha Cabal, MD;  Location: [place] CV LAB;  Service: Cardiovascular;  Laterality: N/A;   PERIPHERAL VASCULAR THROMBECTOMY Bilateral 01/03/2021   Procedure: PERIPHERAL VASCULAR THROMBECTOMY;  Surgeon: Algernon Huxley, MD;  Location: [place] CV LAB;  Service: Cardiovascular;  Laterality: Bilateral;   PULMONARY THROMBECTOMY N/A 08/18/2020   Procedure: PULMONARY THROMBECTOMY;  Surgeon: Katha Cabal, MD;  Location: [place] [facility] LAB;  Service: Cardiovascular;  Laterality: N/A;   TUBAL LIGATION     VISCERAL ANGIOGRAPHY N/A 07/18/2021   Procedure: VISCERAL ANGIOGRAPHY;  Surgeon: Algernon Huxley, MD;  Location: [place] [facility] LAB;  Service: Cardiovascular;  Laterality: N/A;    Home Medications:  Current Meds  Medication Sig   acetaminophen (TYLENOL) 500 MG tablet Take 500 mg  by mouth every 6 (six) hours as needed for mild pain or fever.   Ascorbic Acid (VITAMIN C) 1000 MG tablet Take 1,000 mg by mouth daily.   cyanocobalamin 1000 MCG tablet Take 1 tablet (1,000 mcg total) by mouth daily.   feeding supplement (ENSURE ENLIVE / ENSURE PLUS) LIQD Take 237 mLs by mouth 3 (three) times daily between meals.   fluticasone furoate-vilanterol (BREO ELLIPTA) 100-25 MCG/ACT AEPB Inhale 1 puff into the lungs daily.   furosemide (LASIX) 20 MG tablet Hold due to low blood pressure. (Patient taking differently: Take 20 mg by mouth daily.)   loperamide (IMODIUM) 2 MG capsule Take 1 capsule (2 mg total) by mouth as needed for diarrhea or loose stools.   mometasone-formoterol (DULERA) 100-5 MCG/ACT AERO Inhale 2 puffs into the lungs 2 (two) times daily.   Multiple Vitamin (MULTIVITAMIN WITH MINERALS) TABS tablet Take 1 tablet by mouth daily.   pantoprazole (PROTONIX) 40 MG tablet Take 1 tablet (40 mg total) by mouth 2 (two) times daily.   rivaroxaban (XARELTO) 20 MG TABS tablet Take 1 tablet (20 mg total) by mouth daily. Hold until followup with urology. (Patient taking differently: Take 20 mg by mouth daily with supper.)   rosuvastatin (CRESTOR) 10 MG tablet Take 1 tablet (10 mg total) by mouth daily. (Patient taking differently: Take 10 mg by mouth in the morning.)    Allergies: No Known Allergies  Family History  Problem Relation Age of Onset   Hypertension Mother  Diabetes Mother    Diabetes Father    Hypertension Father    High Cholesterol Father    Congestive Heart Failure Father    Breast cancer Cousin     Social History:  reports that she has never smoked. She has never used smokeless tobacco. She reports that she does not currently use alcohol. She reports that she does not currently use drugs.  ROS: A complete review of systems was performed.  All systems are negative except for pertinent findings as noted.  Physical Exam:  Vital signs in last 24 hours: Temp:   [97.8 F (36.6 C)-98.4 F (36.9 C)] 97.9 F (36.6 C) (09/06 0720) Pulse Rate:  [66-96] 70 (09/06 0720) Resp:  [14-23] 18 (09/06 0720) BP: (89-115)/(46-67) 96/56 (09/06 0720) SpO2:  [97 %-100 %] 100 % (09/06 0720) Weight:  [79.4 kg] 79.4 kg (09/05 1527) Constitutional:  Alert and oriented, no acute distress HEENT: [place] AT, moist mucus membranes Cardiovascular: No clubbing, cyanosis, or edema Respiratory: Normal respiratory effort Skin: No rashes, bruises or suspicious lesions Neurologic: Grossly intact, no focal deficits, moving all 4 extremities Psychiatric: Normal mood and affect  Laboratory Data:  Recent Labs    03/12/22 1530 03/13/22 0353  WBC 6.1 4.6  HGB 5.5* 6.6*  HCT 19.5* 21.8*   Recent Labs    03/12/22 1530  NA 140  K 3.5  CL 101  CO2 28  GLUCOSE 133*  BUN 27*  CREATININE 0.83  CALCIUM 9.4   Recent Labs    03/12/22 1530  INR 1.3*   Urinalysis    Component Value Date/Time   COLORURINE YELLOW (A) 03/12/2022 2013   APPEARANCEUR CLOUDY (A) 03/12/2022 2013   APPEARANCEUR Cloudy (A) 01/03/2022 1443   LABSPEC 1.020 03/12/2022 2013   PHURINE 9.0 (H) 03/12/2022 2013   GLUCOSEU NEGATIVE 03/12/2022 2013   HGBUR NEGATIVE 03/12/2022 2013   [place] NEGATIVE 03/12/2022 2013   BILIRUBINUR Negative 01/03/2022 [place] 03/12/2022 2013   PROTEINUR 100 (A) 03/12/2022 2013   NITRITE NEGATIVE 03/12/2022 2013   LEUKOCYTESUR LARGE (A) 03/12/2022 2013   Results for orders placed or performed during the hospital encounter of 03/12/22  Culture, blood (Routine x 2)     Status: None (Preliminary result)   Collection Time: 03/12/22  3:30 PM   Specimen: BLOOD  Result Value Ref Range Status   Specimen Description BLOOD LEFT ANTECUBITAL  Final   Special Requests   Final    BOTTLES DRAWN AEROBIC AND ANAEROBIC Blood Culture adequate volume   Culture   Final    NO GROWTH < 24 HOURS Performed at [facility], [address], [place], [place]  [address]    Report Status PENDING  Incomplete    Radiologic Imaging: CT ANGIO GI BLEED  Result Date: 03/12/2022 CLINICAL DATA:  Lower GI bleed (Ped 0-17y). Pt here with weakness and c/o her urinary bag not filling like it usually does. Pt also has back pain and states she just does not feel well EXAM: CTA ABDOMEN AND PELVIS WITHOUT AND WITH CONTRAST TECHNIQUE: Multidetector CT imaging of the abdomen and pelvis was performed using the standard protocol during bolus administration of intravenous contrast. Multiplanar reconstructed images and MIPs were obtained and reviewed to evaluate the vascular anatomy. RADIATION DOSE REDUCTION: This exam was performed according to the departmental dose-optimization program which includes automated exposure control, adjustment of the mA and/or kV according to patient size and/or use of iterative reconstruction technique. CONTRAST:  141m OMNIPAQUE IOHEXOL 350  MG/ML SOLN COMPARISON:  None Available. FINDINGS: VASCULAR No extravasation of contrast within the gastrointestinal tract to suggest active bleed. Aorta: Normal caliber aorta without aneurysm, dissection, vasculitis or significant stenosis. Celiac: Patent without evidence of aneurysm, dissection, vasculitis or significant stenosis. SMA: Patent without evidence of aneurysm, dissection, vasculitis or significant stenosis. Renals: Both renal arteries are patent without evidence of aneurysm, dissection, vasculitis, fibromuscular dysplasia or significant stenosis. IMA: Patent without evidence of aneurysm, dissection, vasculitis or significant stenosis. Inflow: Inferior vena cava filter noted in grossly appropriate position. The main portal, splenic, superior mesenteric veins are patent. No definite thrombus within the IVC filter. Proximal Outflow: Bilateral common femoral and visualized portions of the superficial and profunda femoral arteries are patent without evidence of aneurysm, dissection, vasculitis or significant  stenosis. Veins: No obvious venous abnormality within the limitations of this arterial phase study. Review of the MIP images confirms the above findings. NON-VASCULAR Lower chest: No acute abnormality. Hepatobiliary: No focal liver abnormality. No gallstones, gallbladder wall thickening, or pericholecystic fluid. No biliary dilatation. Pancreas: No focal lesion. Normal pancreatic contour. No surrounding inflammatory changes. No main pancreatic ductal dilatation. Spleen: Normal in size without focal abnormality. Adrenals/Urinary Tract: No adrenal nodule bilaterally. Percutaneous nephrostomy tube on the left with pigtail terminating within the left renal pelvis. Vague urothelial thickening associated with this likely due to inflammation in the setting of hardware. Bilateral kidneys enhance symmetrically. Punctate right nephrolithiasis. No ureterolithiasis bilaterally. No left nephrolithiasis. No hydronephrosis. No hydroureter. The urinary bladder is decompressed with circumferential urinary bladder wall thickening. Vague perivesicular fat stranding. Stomach/Bowel: Stomach is within normal limits. No evidence of bowel wall thickening or dilatation. Appendix appears normal. Lymphatic: Asymmetric left periaortic prominent lymph nodes. No lymphadenopathy. Reproductive: Uterus and bilateral adnexa are unremarkable. Other: No intraperitoneal free fluid. No intraperitoneal free gas. No organized fluid collection. Musculoskeletal: Subcutaneus soft tissue edema. No suspicious lytic or blastic osseous lesions. No acute displaced fracture. Grade 1 anterolisthesis of L5 on S1. Bilateral severe hip degenerative changes. IMPRESSION: VASCULAR 1. No CT evidence of active bleed. 2. No acute abdominal aorta abnormality. 3.  Aortic Atherosclerosis (ICD10-I70.0). NON-VASCULAR 1. Left percutaneous nephrostomy tube in appropriate position with associated vague urothelial thickening as well as mild perivesicular fat stranding. Recommend  correlation with urinalysis to exclude superimposed infection. 2. Punctate nonobstructive right nephrolithiasis. Electronically Signed   By: Iven Finn M.D.   On: 03/12/2022 20:13   Assessment & Plan:  66 year old comorbid female with a history of chronic left hydronephrosis and recurrent septic shock managed with left nephrostomy tube, now admitted with symptomatic anemia due to GI bleed as well as reports of a nondraining left nephrostomy tube.  Her nephrostomy tube is draining well on exam today.  No gross hematuria.  Nephrostomy tube is in appropriate position on CT without hydronephrosis.  UA more consistent with inflammation in the setting of nephrostomy tubes and infection, though will add on urine culture for further evaluation.  No indication for urologic intervention at this time.  She may follow-up with IR for scheduled nephrostomy tube exchange as planned.  Low suspicion for urinary infection today, though if she becomes increasingly symptomatic with left flank pain or develops fevers, okay to treat with culture appropriate antibiotics per urine culture results.  Thank you for involving me in this patient's care, please page with any further questions or concerns.  Debroah Loop, PA-C 03/13/2022 8:57 AM

## 2022-03-13 NOTE — ED Notes (Signed)
This RN placed call to blood bank to check on further units coming from [place].

## 2022-03-13 NOTE — Assessment & Plan Note (Signed)
-   Outpatient follow-up °

## 2022-03-14 DIAGNOSIS — K922 Gastrointestinal hemorrhage, unspecified: Secondary | ICD-10-CM | POA: Diagnosis present

## 2022-03-14 DIAGNOSIS — D649 Anemia, unspecified: Secondary | ICD-10-CM

## 2022-03-14 LAB — CBC
HCT: 24.1 % — ABNORMAL LOW (ref 36.0–46.0)
Hemoglobin: 7.4 g/dL — ABNORMAL LOW (ref 12.0–15.0)
MCH: 25.6 pg — ABNORMAL LOW (ref 26.0–34.0)
MCHC: 30.7 g/dL (ref 30.0–36.0)
MCV: 83.4 fL (ref 80.0–100.0)
Platelets: 232 10*3/uL (ref 150–400)
RBC: 2.89 MIL/uL — ABNORMAL LOW (ref 3.87–5.11)
RDW: 20 % — ABNORMAL HIGH (ref 11.5–15.5)
WBC: 5 10*3/uL (ref 4.0–10.5)
nRBC: 0 % (ref 0.0–0.2)

## 2022-03-14 LAB — LACTIC ACID, PLASMA: Lactic Acid, Venous: 1.6 mmol/L (ref 0.5–1.9)

## 2022-03-14 MED ORDER — ORAL CARE MOUTH RINSE: 15.0000 mL | OROMUCOSAL | Status: DC | PRN

## 2022-03-14 NOTE — Plan of Care (Signed)
  Problem: Education: Goal: Knowledge of General Education information will improve Description Including pain rating scale, medication(s)/side effects and non-pharmacologic comfort measures Outcome: Progressing   Problem: Health Behavior/Discharge Planning: Goal: Ability to manage health-related needs will improve Outcome: Progressing   

## 2022-03-14 NOTE — ED Notes (Signed)
Admitting Dr at bedside

## 2022-03-14 NOTE — Progress Notes (Signed)
[name], MD [facility]   [address], [place], [place] [address] Phone: [phone] Fax : [phone]   Subjective: With a history of recurrent GI bleeds.  The patient was seen by Dr. Vicente Males yesterday and was recommended that the patient hold off on her anticoagulation for at least 2 days prior to doing any further intervention.  The patient had a hemoglobin of 5.5 on admission that was 8.4 yesterday and is down to 7.4 today.   Objective: Vital signs in last 24 hours: Vitals:   03/14/22 0930 03/14/22 1000 03/14/22 1135 03/14/22 1405  BP: (!) 112/56 119/60 99/61   Pulse: 76 73 74   Resp: '17 12 20   '$ Temp:   98.2 F (36.8 C)   TempSrc:   Oral   SpO2: 98% 99% 96%   Weight:    84.7 kg  Height:       Weight change:   Intake/Output Summary (Last 24 hours) at 03/14/2022 1633 Last data filed at 03/14/2022 1207 Gross per 24 hour  Intake 100 ml  Output 900 ml  Net -800 ml     Exam: Heart:: Regular rate and rhythm, S1S2 present, or without murmur or extra heart sounds Lungs: normal and clear to auscultation and percussion Abdomen: soft, nontender, normal bowel sounds   Lab Results: '@LABTEST2'$ @ Micro Results: Recent Results (from the past 240 hour(s))  Culture, blood (Routine x 2)     Status: None (Preliminary result)   Collection Time: 03/12/22  3:30 PM   Specimen: BLOOD  Result Value Ref Range Status   Specimen Description BLOOD LEFT ANTECUBITAL  Final   Special Requests   Final    BOTTLES DRAWN AEROBIC AND ANAEROBIC Blood Culture adequate volume   Culture   Final    NO GROWTH 2 DAYS Performed at [facility], [address] [place], [place] [address]    Report Status PENDING  Incomplete   Studies/Results: CT ANGIO GI BLEED  Result Date: 03/12/2022 CLINICAL DATA:  Lower GI bleed (Ped 0-17y). Pt here with weakness and c/o her urinary bag not filling like it usually does. Pt also has back pain and states she just does not feel well EXAM: CTA ABDOMEN  AND PELVIS WITHOUT AND WITH CONTRAST TECHNIQUE: Multidetector CT imaging of the abdomen and pelvis was performed using the standard protocol during bolus administration of intravenous contrast. Multiplanar reconstructed images and MIPs were obtained and reviewed to evaluate the vascular anatomy. RADIATION DOSE REDUCTION: This exam was performed according to the departmental dose-optimization program which includes automated exposure control, adjustment of the mA and/or kV according to patient size and/or use of iterative reconstruction technique. CONTRAST:  136m OMNIPAQUE IOHEXOL 350 MG/ML SOLN COMPARISON:  None Available. FINDINGS: VASCULAR No extravasation of contrast within the gastrointestinal tract to suggest active bleed. Aorta: Normal caliber aorta without aneurysm, dissection, vasculitis or significant stenosis. Celiac: Patent without evidence of aneurysm, dissection, vasculitis or significant stenosis. SMA: Patent without evidence of aneurysm, dissection, vasculitis or significant stenosis. Renals: Both renal arteries are patent without evidence of aneurysm, dissection, vasculitis, fibromuscular dysplasia or significant stenosis. IMA: Patent without evidence of aneurysm, dissection, vasculitis or significant stenosis. Inflow: Inferior vena cava filter noted in grossly appropriate position. The main portal, splenic, superior mesenteric veins are patent. No definite thrombus within the IVC filter. Proximal Outflow: Bilateral common femoral and visualized portions of the superficial and profunda femoral arteries are patent without evidence of aneurysm, dissection, vasculitis or significant stenosis. Veins: No obvious venous  abnormality within the limitations of this arterial phase study. Review of the MIP images confirms the above findings. NON-VASCULAR Lower chest: No acute abnormality. Hepatobiliary: No focal liver abnormality. No gallstones, gallbladder wall thickening, or pericholecystic fluid. No biliary  dilatation. Pancreas: No focal lesion. Normal pancreatic contour. No surrounding inflammatory changes. No main pancreatic ductal dilatation. Spleen: Normal in size without focal abnormality. Adrenals/Urinary Tract: No adrenal nodule bilaterally. Percutaneous nephrostomy tube on the left with pigtail terminating within the left renal pelvis. Vague urothelial thickening associated with this likely due to inflammation in the setting of hardware. Bilateral kidneys enhance symmetrically. Punctate right nephrolithiasis. No ureterolithiasis bilaterally. No left nephrolithiasis. No hydronephrosis. No hydroureter. The urinary bladder is decompressed with circumferential urinary bladder wall thickening. Vague perivesicular fat stranding. Stomach/Bowel: Stomach is within normal limits. No evidence of bowel wall thickening or dilatation. Appendix appears normal. Lymphatic: Asymmetric left periaortic prominent lymph nodes. No lymphadenopathy. Reproductive: Uterus and bilateral adnexa are unremarkable. Other: No intraperitoneal free fluid. No intraperitoneal free gas. No organized fluid collection. Musculoskeletal: Subcutaneus soft tissue edema. No suspicious lytic or blastic osseous lesions. No acute displaced fracture. Grade 1 anterolisthesis of L5 on S1. Bilateral severe hip degenerative changes. IMPRESSION: VASCULAR 1. No CT evidence of active bleed. 2. No acute abdominal aorta abnormality. 3.  Aortic Atherosclerosis (ICD10-I70.0). NON-VASCULAR 1. Left percutaneous nephrostomy tube in appropriate position with associated vague urothelial thickening as well as mild perivesicular fat stranding. Recommend correlation with urinalysis to exclude superimposed infection. 2. Punctate nonobstructive right nephrolithiasis. Electronically Signed   By: Iven Finn M.D.   On: 03/12/2022 20:13   Medications: I have reviewed the patient's current medications. Scheduled Meds:  sodium chloride   Intravenous Once   cyanocobalamin   1,000 mcg Oral Daily   feeding supplement  237 mL Oral TID BM   fluticasone furoate-vilanterol  1 puff Inhalation Daily   multivitamin with minerals  1 tablet Oral Daily   [START ON 03/16/2022] pantoprazole  40 mg Intravenous Q12H   rosuvastatin  10 mg Oral q AM   Continuous Infusions:  sodium chloride     pantoprazole Stopped (03/14/22 0337)   PRN Meds:.morphine injection, ondansetron **OR** ondansetron (ZOFRAN) IV, mouth rinse   Assessment: Principal Problem:   Symptomatic anemia Active Problems:   Morbidly obese (HCC)   Acute on chronic right femoral DVT (deep venous thrombosis) (HCC)   Endometrial cancer (HCC)   HLD (hyperlipidemia)   Chronic diastolic CHF (congestive heart failure) (HCC)   Gastric antral vascular ectasia   Bilateral hydronephrosis s/p left nephrostomy tube June 2023   GI bleed    Plan: This patient is status post multiple endoscopies with a history of melena who had a drop in her hemoglobin from 9.1 thirteen days ago to 5.5 on admission.  The patient will be set up for repeat EGD for tomorrow.  The patient has been explained the plan and agrees with it.   LOS: 2 days   Lewayne Bunting 03/14/2022, 4:33 PM Pager [phone] 7am-5pm  Check AMION for 5pm -7am coverage and on weekends

## 2022-03-14 NOTE — Progress Notes (Signed)
Progress Note   Patient: Courtney Mack RWE:[id number] DOB: 06-14-1956 DOA: 03/12/2022     2 DOS: the patient was seen and examined on 03/14/2022   Brief hospital course: Courtney Mack is a 66 y.o. year old female with PMH recurrent septic shock associated with left hydronephrosis without obstructing stone managed with left nephrostomy tube most recently exchanged by IR on  02/25/2022, DVT on Xarelto, GI bleed, endometrial cancer, heart failure, iron deficiency anemia, and recurrent UTIs who presented to the ED overnight with reports of weakness and nondraining left nephrostomy tube.    Her left hydronephrosis was previously managed with an indwelling left ureteral stent, however this was externalized to a nephrostomy tube in the setting of persistent hematuria and blood loss anemia.  ED course.  Hemodynamically stable.  Hemoglobin was found to be at 5.5, she was admitted for concern of GI bleed.  Received 2 unit of PRBC with improvement of hemoglobin to 6.6, at that point overnight provider ordered third unit. Admission labs also notable for UA with 6-10 RBCs/hpf, 11-20 WBCs/hpf, and rare bacteria; blood culture is pending with no growth at <24 hours; lactate 1.3; creatinine 0.83 (at baseline); and WBC count 6.1.  No urine culture sent.  Less likely a UTI most likely secondary to chronic irritation with nephrostomy tube. CT angio GI bleed yesterday showed appropriate position of the left nephrostomy tube without hydronephrosis.  There is a vague urothelial thickening, likely inflammatory, on the left.  Urology and gastroenterology was consulted.  9/6: Patient with chronic GI bleed.  GI will repeat EGD on Friday as her last dose of Xarelto was on 03/12/2022. Urology was consulted for concern of malfunctioning of nephrostomy tube, it was found to be draining well and no intervention needed at this time.  9/7: Patient with no bowel movement since Tuesday.  No more active bleeding.  Slight  decrease in hemoglobin to 7.4 after increasing up to 8.4 with 3 unit of PRBC. Will be going for EGD tomorrow-keep holding Xarelto. Nephrostomy tube working without any problem.           Assessment and Plan: * Symptomatic anemia History of GAVE on prior EGD. History of chronic GI blood loss, currently on Xarelto with last dose on 03/12/2022. Received total of 3 unit of PRBC.  No obvious bleeding but there is another drop in hemoglobin to 7.4, after reaching up to 8.4 with 3 units. GI was consulted-will repeat EGD on Friday. -Monitor hemoglobin -Transfuse if below 7   Gastric antral vascular ectasia Pt has GAVE on EGD and GI consulted.  IV ppi infusion.    Bilateral hydronephrosis s/p left nephrostomy tube June 2023 CTA abdomen for GI bleed with no concern of persistent hydronephrosis. Urology was consulted for concern of malfunctioning of nephrostomy tube, it was functioning fine and no intervention needed. -Current continue to monitor  Acute on chronic right femoral DVT (deep venous thrombosis) ([place]) Home Xarelto is being held.     Chronic diastolic CHF (congestive heart failure) (HCC) Stable with LE edema . PRN lasix.    Morbidly obese ([place]) Estimated body mass index is 35.35 kg/m as calculated from the following:   Height as of this encounter: '4\' 11"'$  (1.499 m).   Weight as of this encounter: 79.4 kg.   -This will complicate overall prognosis.  Endometrial cancer ([place]) - Outpatient follow-up  HLD (hyperlipidemia) - Continue home Crestor   Subjective: Patient was seen and examined today.  No new complaint.  Did not had  any bowel movement since Tuesday.  Tolerating clear liquids without any difficulty.  Waiting for EGD tomorrow.  Physical Exam: Vitals:   03/14/22 0930 03/14/22 1000 03/14/22 1135 03/14/22 1405  BP: (!) 112/56 119/60 99/61   Pulse: 76 73 74   Resp: '17 12 20   '$ Temp:   98.2 F (36.8 C)   TempSrc:   Oral   SpO2: 98% 99% 96%   Weight:     84.7 kg  Height:       General.  Obese lady, in no acute distress. Pulmonary.  Lungs clear bilaterally, normal respiratory effort. CV.  Regular rate and rhythm, no JVD, rub or murmur. Abdomen.  Soft, nontender, nondistended, BS positive. CNS.  Alert and oriented .  No focal neurologic deficit. Extremities.  1+ LE edema, with lymphedema and signs of chronic venous congestion Psychiatry.  Judgment and insight appears normal.  Data Reviewed: Prior data reviewed.  Family Communication:   Disposition: Status is: Inpatient Remains inpatient appropriate because: Waiting for EGD tomorrow   Planned Discharge Destination: Home   Time spent: 45 minutes  This record has been created using Systems analyst. Errors have been sought and corrected,but may not always be located. Such creation errors do not reflect on the standard of care.  Author: Lorella Nimrod, MD 03/14/2022 4:18 PM  For on call review [email or web address].

## 2022-03-14 NOTE — Assessment & Plan Note (Signed)
CTA abdomen for GI bleed with no concern of persistent hydronephrosis. Urology was consulted for concern of malfunctioning of nephrostomy tube, it was functioning fine and no intervention needed. -Current continue to monitor

## 2022-03-14 NOTE — TOC Initial Note (Signed)
Transition of Care [facility]) - Initial/Assessment Note    Patient Details  Name: Courtney Mack MRN: [id number] Date of Birth: 1955-09-14  Transition of Care [facility]) CM/SW Contact:    Beverly Sessions, RN Phone Number: 03/14/2022, 3:52 PM  Clinical Narrative:                  Patient assessed by [facility] previous admission 8/20.  Please see note from that admission below "Admitted for: anemia  Admitted from:Home with daughter IOX:BDZHGDJ Dian Situ Pharmacy:[facility] Current home health/prior home health/DME:[facility], [facility], [facility]   Patient was previously set up with [facility] for home health services.  Per Corene Cornea with [facility] patient declined services after discharge and they are unable to accept patient back if she wishes to have home health services. "   Transition of Care [facility]) Screening Note   Patient Details  Name: Courtney Mack Date of Birth: Sep 07, 1955   Transition of Care [facility]) CM/SW Contact:    Beverly Sessions, RN Phone Number: 03/14/2022, 3:54 PM    Transition of Care Department [facility]) has reviewed patient and no TOC needs have been identified at this time. We will continue to monitor patient advancement through interdisciplinary progression rounds. If new patient transition needs arise, please place a TOC consult.         Patient Goals and CMS Choice        Expected Discharge Plan and Services                                                Prior Living Arrangements/Services                       Activities of Daily Living Home Assistive Devices/Equipment: Eyeglasses, Dentures (specify type), Walker (specify type) ADL Screening (condition at time of admission) Patient's cognitive ability adequate to safely complete daily activities?: Yes Is the patient deaf or have difficulty hearing?: No Does the patient have difficulty seeing, even when wearing glasses/contacts?: No Does the patient have difficulty concentrating,  remembering, or making decisions?: No Patient able to express need for assistance with ADLs?: Yes Does the patient have difficulty dressing or bathing?: Yes Independently performs ADLs?: No Communication: Independent Dressing (OT): Needs assistance Is this a change from baseline?: Pre-admission baseline Grooming: Independent Feeding: Independent Bathing: Needs assistance Is this a change from baseline?: Pre-admission baseline Toileting: Independent with device (comment) In/Out Bed: Needs assistance Is this a change from baseline?: Pre-admission baseline Walks in Home: Independent with device (comment) Does the patient have difficulty walking or climbing stairs?: Yes Weakness of Legs: Both Weakness of Arms/Hands: Both  Permission Sought/Granted                  Emotional Assessment              Admission diagnosis:  GI bleed [K92.2] Malfunction of nephrostomy tube ([place]) [T83.098A] Symptomatic anemia [D64.9] Patient Active Problem List   Diagnosis Date Noted   GI bleed 03/14/2022   Symptomatic anemia 02/24/2022   Hypotension 02/24/2022   Acute cystitis with hematuria    Lymphedema 02/08/2022   Hematuria 12/24/2021   History of pulmonary embolus (PE) 12/24/2021   Chronic radiation cystitis 11/17/2021   Bilateral hydronephrosis s/p left nephrostomy tube June 2023    Gastric antral vascular ectasia    Melena  07/18/2021   HLD (hyperlipidemia) 06/20/2021   Chronic diastolic CHF (congestive heart failure) ([place]) 06/20/2021   Risk factors for obstructive sleep apnea 11/10/2020   IDA (iron deficiency anemia) 09/21/2020   Endometrial cancer ([place]) 09/13/2020   Acute on chronic right femoral DVT (deep venous thrombosis) ([place]) 09/06/2020   Morbidly obese ([place]) 08/23/2020   Dyslipidemia 08/23/2020   PCP:  Center, [place]:   [id number] [address] [place] [address] Phone: [phone] Fax: [phone]  [address] [place] [address] Phone: [phone] Fax: [phone]     Social Determinants of Health (SDOH) Interventions    Readmission Risk Interventions    03/14/2022    3:51 PM 02/24/2022    2:07 PM 12/27/2021    2:26 PM  Readmission Risk Prevention Plan  Transportation Screening Complete Complete Complete  Medication Review (RN Care Manager) Complete Complete Complete  HRI or Home Care Consult Complete Patient refused Complete  SW Recovery Care/Counseling Consult Complete  Complete  Palliative Care Screening Not Applicable Complete Complete  [place] Not Applicable Not Applicable Not Applicable

## 2022-03-14 NOTE — Assessment & Plan Note (Signed)
History of GAVE on prior EGD. History of chronic GI blood loss, currently on Xarelto with last dose on 03/12/2022. Received total of 3 unit of PRBC.  No obvious bleeding but there is another drop in hemoglobin to 7.4, after reaching up to 8.4 with 3 units. GI was consulted-will repeat EGD on Friday. -Monitor hemoglobin -Transfuse if below 7

## 2022-03-15 ENCOUNTER — Encounter
Admission: EM | Disposition: A | Discharge status: Home / Self Care | Payer: Self-pay | Source: Home / Self Care | Attending: Internal Medicine

## 2022-03-15 ENCOUNTER — Ambulatory Visit: Payer: Medicaid Other | Admitting: Urology

## 2022-03-15 ENCOUNTER — Inpatient Hospital Stay: Payer: Medicare HMO | Admitting: Anesthesiology

## 2022-03-15 ENCOUNTER — Encounter: Payer: Self-pay | Admitting: Gastroenterology

## 2022-03-15 DIAGNOSIS — D649 Anemia, unspecified: Secondary | ICD-10-CM | POA: Diagnosis not present

## 2022-03-15 DIAGNOSIS — K31819 Angiodysplasia of stomach and duodenum without bleeding: Secondary | ICD-10-CM | POA: Diagnosis not present

## 2022-03-15 HISTORY — PX: ESOPHAGOGASTRODUODENOSCOPY (EGD) WITH PROPOFOL: SHX5813

## 2022-03-15 LAB — TYPE AND SCREEN
ABO/RH(D): A POS
Antibody Screen: POSITIVE
Donor AG Type: NEGATIVE
Donor AG Type: NEGATIVE
Donor AG Type: NEGATIVE
Unit division: 0
Unit division: 0
Unit division: 0
Unit division: 0
Unit division: 0

## 2022-03-15 LAB — BPAM RBC
Blood Product Expiration Date: 202310032359
Blood Product Expiration Date: 202310062359
Blood Product Expiration Date: 202310072359
Blood Product Expiration Date: 202310072359
Blood Product Expiration Date: 202310082359
ISSUE DATE / TIME: 202309052017
ISSUE DATE / TIME: 202309052359
ISSUE DATE / TIME: 202309061008
Unit Type and Rh: 5100
Unit Type and Rh: 5100
Unit Type and Rh: 6200
Unit Type and Rh: 6200
Unit Type and Rh: 6200

## 2022-03-15 LAB — CBC
HCT: 25.3 % — ABNORMAL LOW (ref 36.0–46.0)
Hemoglobin: 7.9 g/dL — ABNORMAL LOW (ref 12.0–15.0)
MCH: 25.5 pg — ABNORMAL LOW (ref 26.0–34.0)
MCHC: 31.2 g/dL (ref 30.0–36.0)
MCV: 81.6 fL (ref 80.0–100.0)
Platelets: 224 10*3/uL (ref 150–400)
RBC: 3.1 MIL/uL — ABNORMAL LOW (ref 3.87–5.11)
RDW: 19.9 % — ABNORMAL HIGH (ref 11.5–15.5)
WBC: 4.9 10*3/uL (ref 4.0–10.5)
nRBC: 0 % (ref 0.0–0.2)

## 2022-03-15 SURGERY — ESOPHAGOGASTRODUODENOSCOPY (EGD) WITH PROPOFOL
Anesthesia: General

## 2022-03-15 MED ORDER — PROPOFOL 10 MG/ML IV BOLUS
INTRAVENOUS | Status: DC | PRN
  Administered 2022-03-15: 60 mg via INTRAVENOUS

## 2022-03-15 MED ORDER — PROPOFOL 500 MG/50ML IV EMUL
INTRAVENOUS | Status: DC | PRN
  Administered 2022-03-15: 150 ug/kg/min via INTRAVENOUS

## 2022-03-15 MED ORDER — LIDOCAINE HCL (CARDIAC) PF 100 MG/5ML IV SOSY
PREFILLED_SYRINGE | INTRAVENOUS | Status: DC | PRN
  Administered 2022-03-15: 50 mg via INTRAVENOUS

## 2022-03-15 NOTE — Assessment & Plan Note (Signed)
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  Repeat EGD today with prepyloric vascular ectasia which was treated with argon plasma. -See above for management

## 2022-03-15 NOTE — Op Note (Signed)
[facility] Gastroenterology Patient Name: Courtney Mack Procedure Date: 03/15/2022 11:45 AM MRN: [id number] Account #: [id number] Date of Birth: 11/20/1955 Admit Type: Outpatient Age: 66 Room: [facility] ENDO ROOM 4 Gender: Female Note Status: Finalized Instrument Name: Altamese Cabal Endoscope [id number] Procedure:             Upper GI endoscopy Indications:           Melena Providers:             Jonathon Bellows MD, MD Medicines:             Monitored Anesthesia Care Complications:         No immediate complications. Procedure:             Pre-Anesthesia Assessment:                        - Prior to the procedure, a History and Physical was                         performed, and patient medications, allergies and                         sensitivities were reviewed. The patient's tolerance                         of previous anesthesia was reviewed.                        - The risks and benefits of the procedure and the                         sedation options and risks were discussed with the                         patient. All questions were answered and informed                         consent was obtained.                        - ASA Grade Assessment: II - A patient with mild                         systemic disease.                        After obtaining informed consent, the endoscope was                         passed under direct vision. Throughout the procedure,                         the patient's blood pressure, pulse, and oxygen                         saturations were monitored continuously. The Endoscope                         was introduced through the mouth, and advanced to the  third part of duodenum. The upper GI endoscopy was                         accomplished with ease. The patient tolerated the                         procedure well. Findings:      The esophagus was normal.      The examined duodenum was normal.      Moderate  gastric antral vascular ectasia without bleeding was present in       the prepyloric region of the stomach. Coagulation for bleeding       prevention using argon plasma at 1 liter/minute and 45 watts was       successful.      The cardia and gastric fundus were normal on retroflexion. Impression:            - Normal esophagus.                        - Normal examined duodenum.                        - Gastric antral vascular ectasia without bleeding.                         Treated with argon plasma coagulation (APC).                        - No specimens collected. Recommendation:        - Return patient to hospital ward for ongoing care.                        - Clear liquid diet today.                        - Continue PPI as outpatient                        If hb stable and no visible bleeding home tomorrow and                         follow up with Dr Alice Reichert as outpatient . Consider                         cryotherapy for recurrent bleeding from GAVE Procedure Code(s):     --- Professional ---                        [phone], Esophagogastroduodenoscopy, flexible,                         transoral; with control of bleeding, any method Diagnosis Code(s):     --- Professional ---                        K31.819, Angiodysplasia of stomach and duodenum                         without bleeding  K92.1, Melena (includes Hematochezia) CPT copyright 2019 American Medical Association. All rights reserved. The codes documented in this report are preliminary and upon coder review may  be revised to meet current compliance requirements. Jonathon Bellows, MD Jonathon Bellows MD, MD 03/15/2022 12:02:56 PM This report has been signed electronically. Number of Addenda: 0 Note Initiated On: 03/15/2022 11:45 AM Estimated Blood Loss:  Estimated blood loss: none.      [facility]

## 2022-03-15 NOTE — Assessment & Plan Note (Signed)
History of GAVE on prior EGD. History of chronic GI blood loss, currently on Xarelto with last dose on 03/12/2022. Received total of 3 unit of PRBC.  No obvious bleeding but there was another drop in hemoglobin, now started improving, at 7.9 today. Patient was taken for EGD today and found to have prepyloric gastric ectasia which was treated with argon plasma. GI would like to monitor for 1 more night and if remains stable then discharged tomorrow with outpatient GI follow-up. They recommending cryotherapy for further treatment of her GAVE which can be done as an outpatient. -Monitor hemoglobin -Transfuse if below 7

## 2022-03-15 NOTE — Anesthesia Postprocedure Evaluation (Signed)
Anesthesia Post Note  Patient: Courtney Mack  Procedure(s) Performed: ESOPHAGOGASTRODUODENOSCOPY (EGD) WITH PROPOFOL  Patient location during evaluation: Endoscopy Anesthesia Type: General Level of consciousness: awake and alert Pain management: pain level controlled Vital Signs Assessment: post-procedure vital signs reviewed and stable Respiratory status: spontaneous breathing, nonlabored ventilation, respiratory function stable and patient connected to nasal cannula oxygen Cardiovascular status: blood pressure returned to baseline and stable Postop Assessment: no apparent nausea or vomiting Anesthetic complications: no   No notable events documented.   Last Vitals:  Vitals:   03/15/22 1203 03/15/22 1213  BP: 98/73 98/76  Pulse:    Resp:    Temp: (!) 36.3 C   SpO2:      Last Pain:  Vitals:   03/15/22 1223  TempSrc:   PainSc: 0-No pain                 Precious Haws Gisselle Galvis

## 2022-03-15 NOTE — Assessment & Plan Note (Signed)
Home Xarelto is being held.

## 2022-03-15 NOTE — Transfer of Care (Signed)
Immediate Anesthesia Transfer of Care Note  Patient: MISHA VANOVERBEKE  Procedure(s) Performed: ESOPHAGOGASTRODUODENOSCOPY (EGD) WITH PROPOFOL  Patient Location: PACU  Anesthesia Type:General  Level of Consciousness: awake, alert  and oriented  Airway & Oxygen Therapy: Patient Spontanous Breathing  Post-op Assessment: Report given to RN and Post -op Vital signs reviewed and stable  Post vital signs: Reviewed and stable  Last Vitals:  Vitals Value Taken Time  BP 98/73 03/15/22 1203  Temp    Pulse 82 03/15/22 1205  Resp 39 03/15/22 1205  SpO2 93 % 03/15/22 1205  Vitals shown include unvalidated device data.  Last Pain:  Vitals:   03/15/22 1113  TempSrc: Temporal  PainSc:          Complications: No notable events documented.

## 2022-03-15 NOTE — Care Management Important Message (Signed)
Important Message  Patient Details  Name: Courtney Mack MRN: [id number] Date of Birth: 20-Aug-1955   Medicare Important Message Given:  Yes     [name] 03/15/2022, 3:59 PM

## 2022-03-15 NOTE — Anesthesia Preprocedure Evaluation (Signed)
Anesthesia Evaluation  Patient identified by MRN, date of birth, ID band Patient awake    Reviewed: Allergy & Precautions, NPO status , Patient's Chart, lab work & pertinent test results  History of Anesthesia Complications Negative for: history of anesthetic complications  Airway Mallampati: III  TM Distance: >3 FB Neck ROM: full    Dental  (+) Poor Dentition, Missing, Chipped   Pulmonary neg pulmonary ROS, neg shortness of breath,    Pulmonary exam normal        Cardiovascular hypertension, (-) angina+CHF  Normal cardiovascular exam     Neuro/Psych negative neurological ROS  negative psych ROS   GI/Hepatic Neg liver ROS, GERD  Controlled,  Endo/Other  diabetes, Type 2  Renal/GU Renal disease  negative genitourinary   Musculoskeletal   Abdominal   Peds  Hematology  (+) Blood dyscrasia, anemia ,   Anesthesia Other Findings Past Medical History: 01/02/2021: Acute bilateral deep vein thrombosis (DVT) of femoral  veins ([place]) 08/25/2020: Acute massive pulmonary embolism (HCC)     Comment:  Last Assessment & Plan:  Formatting of this note might               be different from the original. Post vena caval filter               and on xarelto and O2 No date: Anemia No date: ARF (acute respiratory failure) ([place]) 06/20/2021: Cellulitis of left lower leg No date: CHF (congestive heart failure) (HCC) No date: COVID-19 No date: Diabetes mellitus without complication ([place]) 51/88/4166: DVT (deep venous thrombosis) (HCC) No date: Endometrial cancer (HCC) No date: GAVE (gastric antral vascular ectasia) No date: GERD (gastroesophageal reflux disease) No date: High cholesterol No date: Hx of blood clots No date: Hyperlipidemia No date: Hypertension 09/21/2020: IDA (iron deficiency anemia) No date: Obesity 06/21/2021: Pressure injury of skin No date: Pulmonary embolism (HCC) No date: Sepsis ([place]) No date:  Thrombocytopenia ([place]) 09/13/2021: Urinary tract infection  Past Surgical History: 06/21/2021: COLONOSCOPY; N/A     Comment:  Procedure: COLONOSCOPY;  Surgeon: Toledo, Benay Pike, MD;              Location: [facility] ENDOSCOPY;  Service: Gastroenterology;                Laterality: N/A; 09/11/2021: CYSTOSCOPY W/ URETERAL STENT PLACEMENT; Left     Comment:  Procedure: CYSTOSCOPY WITH RETROGRADE PYELOGRAM/URETERAL              STENT PLACEMENT;  Surgeon: Abbie Sons, MD;                Location: [facility] ORS;  Service: Urology;  Laterality: Left; 11/17/2021: CYSTOSCOPY W/ URETERAL STENT PLACEMENT; Left     Comment:  Procedure: CYSTOSCOPY WITH STENT REPLACEMENT;  Surgeon:               Irine Seal, MD;  Location: [facility] ORS;  Service: Urology;               Laterality: Left; 11/17/2021: CYSTOSCOPY WITH FULGERATION     Comment:  Procedure: CYSTOSCOPY WITH FULGERATION;  Surgeon: Irine Seal, MD;  Location: [facility] ORS;  Service: Urology;; 10/17/2021: [place]; Left     Comment:  Procedure: CYSTOSCOPY WITH STENT PLACEMENT;  Surgeon:               Abbie Sons, MD;  Location: [facility] ORS;  Service:  Urology;  Laterality: Left; 10/16/2021: CYSTOSCOPY WITH URETEROSCOPY, STONE BASKETRY AND STENT  PLACEMENT; Left     Comment:  Procedure: CYSTOSCOPY WITH URETEROSCOPY  AND STENT               REMOVAL;  Surgeon: Abbie Sons, MD;  Location: [facility]               ORS;  Service: Urology;  Laterality: Left; 08/17/2021: ENTEROSCOPY; N/A     Comment:  Procedure: ENTEROSCOPY;  Surgeon: Lin Landsman,               MD;  Location: [facility] ENDOSCOPY;  Service:               Gastroenterology;  Laterality: N/A; 06/21/2021: ESOPHAGOGASTRODUODENOSCOPY; N/A     Comment:  Procedure: ESOPHAGOGASTRODUODENOSCOPY (EGD);  Surgeon:               Toledo, Benay Pike, MD;  Location: [facility] ENDOSCOPY;                Service: Gastroenterology;  Laterality: N/A; 08/17/2021:  ESOPHAGOGASTRODUODENOSCOPY (EGD) WITH PROPOFOL; N/A     Comment:  Procedure: ESOPHAGOGASTRODUODENOSCOPY (EGD) WITH               PROPOFOL;  Surgeon: Lin Landsman, MD;  Location:               [facility] ENDOSCOPY;  Service: Gastroenterology;  Laterality:               N/A; 10/14/2021: ESOPHAGOGASTRODUODENOSCOPY (EGD) WITH PROPOFOL; N/A     Comment:  Procedure: ESOPHAGOGASTRODUODENOSCOPY (EGD) WITH               PROPOFOL;  Surgeon: [name], MD;  Location: [facility]               ENDOSCOPY;  Service: Endoscopy;  Laterality: N/A; 02/01/2022: ESOPHAGOGASTRODUODENOSCOPY (EGD) WITH PROPOFOL; N/A     Comment:  Procedure: ESOPHAGOGASTRODUODENOSCOPY (EGD) WITH               PROPOFOL;  Surgeon: Lin Landsman, MD;  Location:               [facility] ENDOSCOPY;  Service: Gastroenterology;  Laterality:               N/A; 06/22/2021: GIVENS CAPSULE STUDY; N/A     Comment:  Procedure: GIVENS CAPSULE STUDY;  Surgeon: Toledo,               Benay Pike, MD;  Location: [facility] ENDOSCOPY;  Service:               Gastroenterology;  Laterality: N/A; 02/25/2022: IR NEPHROSTOMY EXCHANGE LEFT 12/27/2021: IR NEPHROSTOMY PLACEMENT LEFT 08/18/2020: IVC FILTER INSERTION; N/A     Comment:  Procedure: IVC FILTER INSERTION;  Surgeon: Katha Cabal, MD;  Location: [place] CV LAB;  Service:              Cardiovascular;  Laterality: N/A; 01/03/2021: PERIPHERAL VASCULAR THROMBECTOMY; Bilateral     Comment:  Procedure: PERIPHERAL VASCULAR THROMBECTOMY;  Surgeon:               Algernon Huxley, MD;  Location: [place] CV LAB;                Service: Cardiovascular;  Laterality: Bilateral; 08/18/2020: PULMONARY THROMBECTOMY; N/A     Comment:  Procedure: PULMONARY THROMBECTOMY;  Surgeon:  Schnier,               Dolores Lory, MD;  Location: [place] CV LAB;  Service:              Cardiovascular;  Laterality: N/A; No date: TUBAL LIGATION 07/18/2021: VISCERAL ANGIOGRAPHY; N/A     Comment:  Procedure: VISCERAL  ANGIOGRAPHY;  Surgeon: Algernon Huxley,              MD;  Location: [place] CV LAB;  Service:               Cardiovascular;  Laterality: N/A;  BMI    Body Mass Index: 37.71 kg/m      Reproductive/Obstetrics negative OB ROS                             Anesthesia Physical Anesthesia Plan  ASA: 3  Anesthesia Plan: General   Post-op Pain Management:    Induction: Intravenous  PONV Risk Score and Plan: Propofol infusion and TIVA  Airway Management Planned: Natural Airway and Nasal Cannula  Additional Equipment:   Intra-op Plan:   Post-operative Plan:   Informed Consent: I have reviewed the patients History and Physical, chart, labs and discussed the procedure including the risks, benefits and alternatives for the proposed anesthesia with the patient or authorized representative who has indicated his/her understanding and acceptance.     Dental Advisory Given  Plan Discussed with: Anesthesiologist, CRNA and Surgeon  Anesthesia Plan Comments: (Patient consented for risks of anesthesia including but not limited to:  - adverse reactions to medications - risk of airway placement if required - damage to eyes, teeth, lips or other oral mucosa - nerve damage due to positioning  - sore throat or hoarseness - Damage to heart, brain, nerves, lungs, other parts of body or loss of life  Patient voiced understanding.)        Anesthesia Quick Evaluation

## 2022-03-15 NOTE — Progress Notes (Signed)
Progress Note   Patient: Courtney Mack DOB: 1956-02-04 DOA: 03/12/2022     3 DOS: the patient was seen and examined on 03/15/2022   Brief hospital course: Courtney Mack is a 66 y.o. year old female with PMH recurrent septic shock associated with left hydronephrosis without obstructing stone managed with left nephrostomy tube most recently exchanged by IR on  02/25/2022, DVT on Xarelto, GI bleed, endometrial cancer, heart failure, iron deficiency anemia, and recurrent UTIs who presented to the ED overnight with reports of weakness and nondraining left nephrostomy tube.    Her left hydronephrosis was previously managed with an indwelling left ureteral stent, however this was externalized to a nephrostomy tube in the setting of persistent hematuria and blood loss anemia.  ED course.  Hemodynamically stable.  Hemoglobin was found to be at 5.5, she was admitted for concern of GI bleed.  Received 2 unit of PRBC with improvement of hemoglobin to 6.6, at that point overnight provider ordered third unit. Admission labs also notable for UA with 6-10 RBCs/hpf, 11-20 WBCs/hpf, and rare bacteria; blood culture is pending with no growth at <24 hours; lactate 1.3; creatinine 0.83 (at baseline); and WBC count 6.1.  No urine culture sent.  Less likely a UTI most likely secondary to chronic irritation with nephrostomy tube. CT angio GI bleed yesterday showed appropriate position of the left nephrostomy tube without hydronephrosis.  There is a vague urothelial thickening, likely inflammatory, on the left.  Urology and gastroenterology was consulted.  9/6: Patient with chronic GI bleed.  GI will repeat EGD on Friday as her last dose of Xarelto was on 03/12/2022. Urology was consulted for concern of malfunctioning of nephrostomy tube, it was found to be draining well and no intervention needed at this time.  9/7: Patient with no bowel movement since Tuesday.  No more active bleeding.  Slight  decrease in hemoglobin to 7.4 after increasing up to 8.4 with 3 unit of PRBC. Will be going for EGD tomorrow-keep holding Xarelto. Nephrostomy tube working without any problem.  9/8: Hemodynamically stable with some improvement in hemoglobin.  No more melena.  EGD with some vascular ectasia found in prepyloric region s/p argon plasma. GI would like to keep her on clear liquid and observe for another night.  They were suggesting cryotherapy by outpatient GI for further management of her GAVE.           Assessment and Plan: * Symptomatic anemia History of GAVE on prior EGD. History of chronic GI blood loss, currently on Xarelto with last dose on 03/12/2022. Received total of 3 unit of PRBC.  No obvious bleeding but there was another drop in hemoglobin, now started improving, at 7.9 today. Patient was taken for EGD today and found to have prepyloric gastric ectasia which was treated with argon plasma. GI would like to monitor for 1 more night and if remains stable then discharged tomorrow with outpatient GI follow-up. They recommending cryotherapy for further treatment of her GAVE which can be done as an outpatient. -Monitor hemoglobin -Transfuse if below 7   Gastric antral vascular ectasia Pt has GAVE on EGD and GI consulted.  Repeat EGD today with prepyloric vascular ectasia which was treated with argon plasma. -See above for management   Bilateral hydronephrosis s/p left nephrostomy tube June 2023 CTA abdomen for GI bleed with no concern of persistent hydronephrosis. Urology was consulted for concern of malfunctioning of nephrostomy tube, it was functioning fine and no intervention needed. -Current continue to  monitor  Acute on chronic right femoral DVT (deep venous thrombosis) ([place]) Home Xarelto is being held.     Chronic diastolic CHF (congestive heart failure) (HCC) Stable with LE edema . PRN lasix.    Morbidly obese ([place]) Estimated body mass index is 35.35 kg/m as  calculated from the following:   Height as of this encounter: '4\' 11"'$  (1.499 m).   Weight as of this encounter: 79.4 kg.   -This will complicate overall prognosis.  Endometrial cancer ([place]) - Outpatient follow-up  HLD (hyperlipidemia) - Continue home Crestor   Subjective: Patient was seen and examined today.  Did not had any more melanotic stools.  No other complaints  Physical Exam: Vitals:   03/15/22 0824 03/15/22 1113 03/15/22 1203 03/15/22 1213  BP: (!) 97/49 (!) 1'05/45 98/73 98/76 '$  Pulse: 72 73    Resp: 20 20    Temp: 98.6 F (37 C) (!) 97.2 F (36.2 C) (!) 97.3 F (36.3 C)   TempSrc: Oral Temporal Temporal   SpO2: 94% 97%    Weight:      Height:       General.  Ill-appearing, obese lady, in no acute distress. Pulmonary.  Lungs clear bilaterally, normal respiratory effort. CV.  Regular rate and rhythm, no JVD, rub or murmur. Abdomen.  Soft, nontender, nondistended, BS positive.  Nephrostomy tube in place CNS.  Alert and oriented .  No focal neurologic deficit. Extremities.  1+ LE edema with lymphedema Psychiatry.  Judgment and insight appears normal.  Data Reviewed: Prior data reviewed  Family Communication:   Disposition: Status is: Inpatient Remains inpatient appropriate because: Severity of illness   Planned Discharge Destination: Home  Time spent: 45 minutes  This record has been created using Systems analyst. Errors have been sought and corrected,but may not always be located. Such creation errors do not reflect on the standard of care.  Author: Lorella Nimrod, MD 03/15/2022 2:53 PM  For on call review [email or web address].

## 2022-03-15 NOTE — H&P (Signed)
Jonathon Bellows, MD [address] [place], [place], [place], [address] [id number], [place], [place], [place], [address] Phone: [phone]  Fax: [phone]  Primary Care Physician:  [facility] [place] [facility]   Pre-Procedure History & Physical: HPI:  Courtney Mack is a 66 y.o. female is here for an endoscopy    Past Medical History:  Diagnosis Date   Acute bilateral deep vein thrombosis (DVT) of femoral veins ([place]) 01/02/2021   Acute massive pulmonary embolism ([place]) 08/25/2020   Last Assessment & Plan:  Formatting of this note might be different from the original. Post vena caval filter and on xarelto and O2   Anemia    ARF (acute respiratory failure) (HCC)    Cellulitis of left lower leg 06/20/2021   CHF (congestive heart failure) ([place])    COVID-19    Diabetes mellitus without complication ([place])    DVT (deep venous thrombosis) ([place]) 09/06/2020   Endometrial cancer ([place])    GAVE (gastric antral vascular ectasia)    GERD (gastroesophageal reflux disease)    High cholesterol    Hx of blood clots    Hyperlipidemia    Hypertension    IDA (iron deficiency anemia) 09/21/2020   Obesity    Pressure injury of skin 06/21/2021   Pulmonary embolism ([place])    Sepsis ([place])    Thrombocytopenia ([place])    Urinary tract infection 09/13/2021    Past Surgical History:  Procedure Laterality Date   COLONOSCOPY N/A 06/21/2021   Procedure: COLONOSCOPY;  Surgeon: Toledo, Benay Pike, MD;  Location: [facility] ENDOSCOPY;  Service: Gastroenterology;  Laterality: N/A;   CYSTOSCOPY W/ URETERAL STENT PLACEMENT Left 09/11/2021   Procedure: CYSTOSCOPY WITH RETROGRADE PYELOGRAM/URETERAL STENT PLACEMENT;  Surgeon: Abbie Sons, MD;  Location: [facility] ORS;  Service: Urology;  Laterality: Left;   CYSTOSCOPY W/ URETERAL STENT PLACEMENT Left 11/17/2021   Procedure: CYSTOSCOPY WITH STENT REPLACEMENT;  Surgeon: Irine Seal, MD;  Location: [facility] ORS;  Service: Urology;  Laterality: Left;    CYSTOSCOPY WITH FULGERATION  11/17/2021   Procedure: CYSTOSCOPY WITH FULGERATION;  Surgeon: Irine Seal, MD;  Location: [facility] ORS;  Service: Urology;;   CYSTOSCOPY WITH STENT PLACEMENT Left 10/17/2021   Procedure: CYSTOSCOPY WITH STENT PLACEMENT;  Surgeon: Abbie Sons, MD;  Location: [facility] ORS;  Service: Urology;  Laterality: Left;   CYSTOSCOPY WITH URETEROSCOPY, STONE BASKETRY AND STENT PLACEMENT Left 10/16/2021   Procedure: CYSTOSCOPY WITH URETEROSCOPY  AND STENT REMOVAL;  Surgeon: Abbie Sons, MD;  Location: [facility] ORS;  Service: Urology;  Laterality: Left;   ENTEROSCOPY N/A 08/17/2021   Procedure: ENTEROSCOPY;  Surgeon: Lin Landsman, MD;  Location: [facility] ENDOSCOPY;  Service: Gastroenterology;  Laterality: N/A;   ESOPHAGOGASTRODUODENOSCOPY N/A 06/21/2021   Procedure: ESOPHAGOGASTRODUODENOSCOPY (EGD);  Surgeon: Toledo, Benay Pike, MD;  Location: [facility] ENDOSCOPY;  Service: Gastroenterology;  Laterality: N/A;   ESOPHAGOGASTRODUODENOSCOPY (EGD) WITH PROPOFOL N/A 08/17/2021   Procedure: ESOPHAGOGASTRODUODENOSCOPY (EGD) WITH PROPOFOL;  Surgeon: Lin Landsman, MD;  Location[facility] ENDOSCOPY;  Service: Gastroenterology;  Laterality: N/A;   ESOPHAGOGASTRODUODENOSCOPY (EGD) WITH PROPOFOL N/A 10/14/2021   Procedure: ESOPHAGOGASTRODUODENOSCOPY (EGD) WITH PROPOFOL;  Surgeon: [name], MD;  Location: [facility] ENDOSCOPY;  Service: Endoscopy;  Laterality: N/A;   ESOPHAGOGASTRODUODENOSCOPY (EGD) WITH PROPOFOL N/A 02/01/2022   Procedure: ESOPHAGOGASTRODUODENOSCOPY (EGD) WITH PROPOFOL;  Surgeon: Lin Landsman, MD;  Location[facility] ENDOSCOPY;  Service: Gastroenterology;  Laterality: N/A;   GIVENS CAPSULE STUDY N/A 06/22/2021   Procedure: GIVENS CAPSULE STUDY;  Surgeon: [place], [place],  MD;  Location: [facility] ENDOSCOPY;  Service: Gastroenterology;  Laterality: N/A;   IR NEPHROSTOMY EXCHANGE LEFT  02/25/2022   IR NEPHROSTOMY PLACEMENT LEFT  12/27/2021   IVC FILTER INSERTION N/A 08/18/2020   Procedure: IVC  FILTER INSERTION;  Surgeon: Katha Cabal, MD;  Location: [place] CV LAB;  Service: Cardiovascular;  Laterality: N/A;   PERIPHERAL VASCULAR THROMBECTOMY Bilateral 01/03/2021   Procedure: PERIPHERAL VASCULAR THROMBECTOMY;  Surgeon: Algernon Huxley, MD;  Location: [place] CV LAB;  Service: Cardiovascular;  Laterality: Bilateral;   PULMONARY THROMBECTOMY N/A 08/18/2020   Procedure: PULMONARY THROMBECTOMY;  Surgeon: Katha Cabal, MD;  Location: [place] CV LAB;  Service: Cardiovascular;  Laterality: N/A;   TUBAL LIGATION     VISCERAL ANGIOGRAPHY N/A 07/18/2021   Procedure: VISCERAL ANGIOGRAPHY;  Surgeon: Algernon Huxley, MD;  Location: [place] CV LAB;  Service: Cardiovascular;  Laterality: N/A;    Prior to Admission medications   Medication Sig Start Date End Date Taking? Authorizing Provider  acetaminophen (TYLENOL) 500 MG tablet Take 500 mg by mouth every 6 (six) hours as needed for mild pain or fever.   Yes [provider]  Ascorbic Acid (VITAMIN C) 1000 MG tablet Take 1,000 mg by mouth daily.   Yes [provider]  cyanocobalamin 1000 MCG tablet Take 1 tablet (1,000 mcg total) by mouth daily. 02/03/22  Yes Fritzi Mandes, MD  feeding supplement (ENSURE ENLIVE / ENSURE PLUS) LIQD Take 237 mLs by mouth 3 (three) times daily between meals. 02/02/22  Yes Fritzi Mandes, MD  fluticasone furoate-vilanterol (BREO ELLIPTA) 100-25 MCG/ACT AEPB Inhale 1 puff into the lungs daily.   Yes [provider]  furosemide (LASIX) 20 MG tablet Hold due to low blood pressure. Patient taking differently: Take 20 mg by mouth daily. 12/31/21  Yes Enzo Bi, MD  loperamide (IMODIUM) 2 MG capsule Take 1 capsule (2 mg total) by mouth as needed for diarrhea or loose stools. 06/26/21  Yes Sreenath, Sudheer B, MD  mometasone-formoterol (DULERA) 100-5 MCG/ACT AERO Inhale 2 puffs into the lungs 2 (two) times daily.   Yes [provider]  Multiple Vitamin (MULTIVITAMIN WITH  MINERALS) TABS tablet Take 1 tablet by mouth daily. 09/12/20  Yes [name], MD  pantoprazole (PROTONIX) 40 MG tablet Take 1 tablet (40 mg total) by mouth 2 (two) times daily. 03/01/22 04/30/22 Yes Sreenath, Sudheer B, MD  rivaroxaban (XARELTO) 20 MG TABS tablet Take 1 tablet (20 mg total) by mouth daily. Hold until followup with urology. Patient taking differently: Take 20 mg by mouth daily with supper. 03/01/22 05/30/22 Yes Sreenath, Sudheer B, MD  rosuvastatin (CRESTOR) 10 MG tablet Take 1 tablet (10 mg total) by mouth daily. Patient taking differently: Take 10 mg by mouth in the morning. 10/03/20  Yes Earlie Server, MD    Allergies as of 03/12/2022   (No Known Allergies)    Family History  Problem Relation Age of Onset   Hypertension Mother    Diabetes Mother    Diabetes Father    Hypertension Father    High Cholesterol Father    Congestive Heart Failure Father    Breast cancer Cousin     Social History   Socioeconomic History   Marital status: Single    Spouse name: Not on file   Number of children: Not on file   Years of education: Not on file   Highest education level: Not on file  Occupational History   Not on file  Tobacco  Use   Smoking status: Never   Smokeless tobacco: Never   Tobacco comments:    smoked 2-4 cigarettes for about 2-3 weeks   Vaping Use   Vaping Use: Never used  Substance and Sexual Activity   Alcohol use: Not Currently   Drug use: Not Currently   Sexual activity: Not Currently  Other Topics Concern   Not on file  Social History Narrative   Not on file   Social Determinants of Health   Financial Resource Strain: Not on file  Food Insecurity: No Food Insecurity (03/13/2022)   Hunger Vital Sign    Worried About Running Out of Food in the Last Year: Never true    Ran Out of Food in the Last Year: Never true  Transportation Needs: No Transportation Needs (03/13/2022)   PRAPARE - Hydrologist (Medical): No    Lack  of Transportation (Non-Medical): No  Physical Activity: Not on file  Stress: Not on file  Social Connections: Not on file  Intimate Partner Violence: Not At Risk (03/13/2022)   Humiliation, Afraid, Rape, and Kick questionnaire    Fear of Current or Ex-Partner: No    Emotionally Abused: No    Physically Abused: No    Sexually Abused: No    Review of Systems: See HPI, otherwise negative ROS  Physical Exam: BP (!) 105/45   Pulse 73   Temp (!) 97.2 F (36.2 C) (Temporal)   Resp 20   Ht '4\' 11"'$  (1.499 m)   Wt 84.7 kg   SpO2 97%   BMI 37.71 kg/m  General:   Alert,  pleasant and cooperative in NAD Head:  Normocephalic and atraumatic. Neck:  Supple; no masses or thyromegaly. Lungs:  Clear throughout to auscultation, normal respiratory effort.    Heart:  +S1, +S2, Regular rate and rhythm, No edema. Abdomen:  Soft, nontender and nondistended. Normal bowel sounds, without guarding, and without rebound.   Neurologic:  Alert and  oriented x4;  grossly normal neurologically.  Impression/Plan: Courtney Mack is here for an endoscopy  to be performed for  evaluation of melena    Risks, benefits, limitations, and alternatives regarding endoscopy have been reviewed with the patient.  Questions have been answered.  All parties agreeable.   Jonathon Bellows, MD  03/15/2022, 11:42 AM

## 2022-03-16 DIAGNOSIS — D649 Anemia, unspecified: Secondary | ICD-10-CM | POA: Diagnosis not present

## 2022-03-16 LAB — CBC
HCT: 26.3 % — ABNORMAL LOW (ref 36.0–46.0)
Hemoglobin: 7.8 g/dL — ABNORMAL LOW (ref 12.0–15.0)
MCH: 24.4 pg — ABNORMAL LOW (ref 26.0–34.0)
MCHC: 29.7 g/dL — ABNORMAL LOW (ref 30.0–36.0)
MCV: 82.2 fL (ref 80.0–100.0)
Platelets: 225 10*3/uL (ref 150–400)
RBC: 3.2 MIL/uL — ABNORMAL LOW (ref 3.87–5.11)
RDW: 19.8 % — ABNORMAL HIGH (ref 11.5–15.5)
WBC: 4.8 10*3/uL (ref 4.0–10.5)
nRBC: 0 % (ref 0.0–0.2)

## 2022-03-16 LAB — URINE CULTURE: Culture: >=100000 — AB

## 2022-03-16 MED ORDER — FERROUS SULFATE 325 (65 FE) MG PO TABS: 325.0000 mg | ORAL_TABLET | Freq: Two times a day (BID) | ORAL | 3 refills | Status: DC

## 2022-03-16 MED ORDER — FERROUS SULFATE 325 (65 FE) MG PO TABS: 325.0000 mg | ORAL_TABLET | Freq: Two times a day (BID) | ORAL | Status: DC

## 2022-03-16 MED ORDER — RIVAROXABAN 20 MG PO TABS: 20.0000 mg | ORAL_TABLET | Freq: Every day | ORAL | 2 refills | Status: DC

## 2022-03-16 NOTE — Evaluation (Signed)
Physical Therapy Evaluation Patient Details Name: KIYRA SLAUBAUGH MRN: [id number] DOB: [birth year] Today's Date: 03/16/2022  History of Present Illness  YAMILI LICHTENWALNER is a 66 y.o. year old female with PMH recurrent septic shock associated with left hydronephrosis without obstructing stone managed with left nephrostomy tube most recently exchanged by IR on  02/25/2022, DVT on Xarelto, GI bleed, endometrial cancer, heart failure, iron deficiency anemia, and recurrent UTIs who presented to the ED overnight with reports of weakness and nondraining left nephrostomy tube.  Clinical Impression  Pt presents alert and oriented laying in bed and agreeable to participate in PT. She exhibits decreased gait velocity with decreased step length and increased reliance on UE support to maintain balance while ambulating. However, based on prior notes and pt report, she at her functional baseline and she is safe to return home and she does not need additional PT.        Recommendations for follow up therapy are one component of a multi-disciplinary discharge planning process, led by the attending physician.  Recommendations may be updated based on patient status, additional functional criteria and insurance authorization.  Follow Up Recommendations No PT follow up      Assistance Recommended at Discharge Intermittent Supervision/Assistance  Patient can return home with the following  A little help with bathing/dressing/bathroom;Assist for transportation;Assistance with cooking/housework    Equipment Recommendations None recommended by PT  Recommendations for Other Services       Functional Status Assessment Patient has had a recent decline in their functional status and demonstrates the ability to make significant improvements in function in a reasonable and predictable amount of time.     Precautions / Restrictions Precautions Precautions: Fall Restrictions Weight Bearing Restrictions: No       Mobility  Bed Mobility Overal bed mobility: Modified Independent Bed Mobility: Supine to Sit     Supine to sit: Modified independent (Device/Increase time)     General bed mobility comments: Use of railings and flattened out bed to decrease difficulty    Transfers Overall transfer level: Modified independent Equipment used: Rolling walker (2 wheels) Transfers: Sit to/from Stand Sit to Stand: Min guard                Ambulation/Gait Ambulation/Gait assistance: Supervision Gait Distance (Feet): 55 Feet Assistive device: Rolling walker (2 wheels) Gait Pattern/deviations: Wide base of support, Decreased step length - left, Decreased step length - right Gait velocity: decreased     General Gait Details: Forward flexed posture with decreased step length bilateral; pt c/o increased knee pain while walking  Stairs            Wheelchair Mobility    Modified Rankin (Stroke Patients Only)       Balance Overall balance assessment: Modified Independent Sitting-balance support: No upper extremity supported Sitting balance-Leahy Scale: Good     Standing balance support: Bilateral upper extremity supported Standing balance-Leahy Scale: Good Standing balance comment: Forward lean on walker but no LOB                             Pertinent Vitals/Pain Pain Assessment Pain Assessment: No/denies pain    Home Living Family/patient expects to be discharged to:: Private residence Living Arrangements: Children Available Help at Discharge: Family;Available 24 hours/day Type of Home: House Home Access: Ramped entrance       Home Layout: One level Home Equipment: Rollator (4 wheels);BSC/3in1;Other (comment);Cane - single point;Adaptive equipment;Wheelchair - manual Additional  Comments: sleeps in recliner    Prior Function Prior Level of Function : Independent/Modified Independent             Mobility Comments: amb household distances with  rollator ADLs Comments: pt daugther assists with LB dressing, bathing; Pt able to perform UB dressing, grooming with MOD I     Hand Dominance   Dominant Hand: Right    Extremity/Trunk Assessment   Upper Extremity Assessment Upper Extremity Assessment: Defer to OT evaluation    Lower Extremity Assessment Lower Extremity Assessment: Overall WFL for tasks assessed       Communication   Communication: No difficulties  Cognition Arousal/Alertness: Awake/alert Behavior During Therapy: WFL for tasks assessed/performed Overall Cognitive Status: Within Functional Limits for tasks assessed                                          General Comments      Exercises     Assessment/Plan    PT Assessment Patient does not need any further PT services  PT Problem List         PT Treatment Interventions      PT Goals (Current goals can be found in the Care Plan section)  Acute Rehab PT Goals Patient Stated Goal: Go home PT Goal Formulation: With patient Time For Goal Achievement: 03/30/22 Potential to Achieve Goals: Good    Frequency       Co-evaluation               AM-PAC PT "6 Clicks" Mobility  Outcome Measure Help needed turning from your back to your side while in a flat bed without using bedrails?: A Little Help needed moving from lying on your back to sitting on the side of a flat bed without using bedrails?: A Little Help needed moving to and from a bed to a chair (including a wheelchair)?: None Help needed standing up from a chair using your arms (e.g., wheelchair or bedside chair)?: None Help needed to walk in hospital room?: None Help needed climbing 3-5 steps with a railing? : A Lot 6 Click Score: 20    End of Session Equipment Utilized During Treatment: Gait belt Activity Tolerance: Patient tolerated treatment well Patient left: in chair;with call bell/phone within reach;with chair alarm set Nurse Communication: Mobility status       Time: 1420-1500 PT Time Calculation (min) (ACUTE ONLY): 40 min   Charges:   PT Evaluation $PT Eval Low Complexity: 1 Low PT Treatments $Therapeutic Activity: 8-22 mins       Bradly Chris PT, DPT  03/16/2022, 3:01 PM

## 2022-03-16 NOTE — Discharge Summary (Signed)
Physician Discharge Summary   Patient: Courtney Mack MRN: [id number] DOB: 11-Feb-1956  Admit date:     03/12/2022  Discharge date: 03/16/22  Discharge Physician: Lorella Nimrod   PCP: [facility] [place]   Recommendations at discharge:  Please obtain CBC and BMP in 1 week Follow-up with primary care provider Follow-up with gastroenterology  Discharge Diagnoses: Principal Problem:   Symptomatic anemia Active Problems:   Gastric antral vascular ectasia   Bilateral hydronephrosis s/p left nephrostomy tube June 2023   Acute on chronic right femoral DVT (deep venous thrombosis) (HCC)   Chronic diastolic CHF (congestive heart failure) ([place])   Morbidly obese ([place])   Endometrial cancer ([place])   HLD (hyperlipidemia)   GI bleed   Hospital Course: Courtney Mack is a 66 y.o. year old female with PMH recurrent septic shock associated with left hydronephrosis without obstructing stone managed with left nephrostomy tube most recently exchanged by IR on  02/25/2022, DVT on Xarelto, GI bleed, endometrial cancer, heart failure, iron deficiency anemia, and recurrent UTIs who presented to the ED overnight with reports of weakness and nondraining left nephrostomy tube.    Her left hydronephrosis was previously managed with an indwelling left ureteral stent, however this was externalized to a nephrostomy tube in the setting of persistent hematuria and blood loss anemia.  ED course.  Hemodynamically stable.  Hemoglobin was found to be at 5.5, she was admitted for concern of GI bleed.  Received 2 unit of PRBC with improvement of hemoglobin to 6.6, at that point overnight provider ordered third unit. Admission labs also notable for UA with 6-10 RBCs/hpf, 11-20 WBCs/hpf, and rare bacteria; blood culture is pending with no growth at <24 hours; lactate 1.3; creatinine 0.83 (at baseline); and WBC count 6.1.  No urine culture sent.  Less likely a UTI most likely secondary to chronic  irritation with nephrostomy tube. CT angio GI bleed yesterday showed appropriate position of the left nephrostomy tube without hydronephrosis.  There is a vague urothelial thickening, likely inflammatory, on the left.  Urology and gastroenterology was consulted.  9/6: Patient with chronic GI bleed.  GI will repeat EGD on Friday as her last dose of Xarelto was on 03/12/2022. Urology was consulted for concern of malfunctioning of nephrostomy tube, it was found to be draining well and no intervention needed at this time.  9/7: Patient with no bowel movement since Tuesday.  No more active bleeding.  Slight decrease in hemoglobin to 7.4 after increasing up to 8.4 with 3 unit of PRBC. Will be going for EGD tomorrow-keep holding Xarelto. Nephrostomy tube working without any problem.  9/8: Hemodynamically stable with some improvement in hemoglobin.  No more melena.  EGD with some vascular ectasia found in prepyloric region s/p argon plasma. GI would like to keep her on clear liquid and observe for another night.  They were suggesting cryotherapy by outpatient GI for further management of her GAVE.  9/9: Hemoglobin remained stable.  She was started on iron supplement.  PT with no follow-up recommendations.  Patient appears to be at her baseline. GI is recommending starting Xarelto after 3 days. If patient experience more GI bleeding she might have to discontinue Xarelto.  Patient need to have a close follow-up with gastroenterology who can decide about cryotherapy if needed.  She will continue on current medications and follow-up with her providers closely for further recommendations.          Assessment and Plan: * Symptomatic anemia History of GAVE  on prior EGD. History of chronic GI blood loss, currently on Xarelto with last dose on 03/12/2022. Received total of 3 unit of PRBC.  No obvious bleeding but there was another drop in hemoglobin, now started improving, at 7.9 today. Patient was taken  for EGD today and found to have prepyloric gastric ectasia which was treated with argon plasma. GI would like to monitor for 1 more night and if remains stable then discharged tomorrow with outpatient GI follow-up. They recommending cryotherapy for further treatment of her GAVE which can be done as an outpatient. -Monitor hemoglobin -Transfuse if below 7   Gastric antral vascular ectasia Pt has GAVE on EGD and GI consulted.  Repeat EGD today with prepyloric vascular ectasia which was treated with argon plasma. -See above for management   Bilateral hydronephrosis s/p left nephrostomy tube June 2023 CTA abdomen for GI bleed with no concern of persistent hydronephrosis. Urology was consulted for concern of malfunctioning of nephrostomy tube, it was functioning fine and no intervention needed. -Current continue to monitor  Acute on chronic right femoral DVT (deep venous thrombosis) ([place]) Home Xarelto is being held.     Chronic diastolic CHF (congestive heart failure) (HCC) Stable with LE edema . PRN lasix.    Morbidly obese ([place]) Estimated body mass index is 35.35 kg/m as calculated from the following:   Height as of this encounter: '4\' 11"'$  (1.499 m).   Weight as of this encounter: 79.4 kg.   -This will complicate overall prognosis.  Endometrial cancer ([place]) - Outpatient follow-up  HLD (hyperlipidemia) - Continue home Crestor   Consultants: Gastroenterology Procedures performed: EGD Disposition: Home Diet recommendation:  Discharge Diet Orders (From admission, onward)     Start     Ordered   03/16/22 0000  Diet - low sodium heart healthy        03/16/22 1510           Cardiac diet DISCHARGE MEDICATION: Allergies as of 03/16/2022   No Known Allergies      Medication List     TAKE these medications    acetaminophen 500 MG tablet Commonly known as: TYLENOL Take 500 mg by mouth every 6 (six) hours as needed for mild pain or fever.   cyanocobalamin 1000  MCG tablet Take 1 tablet (1,000 mcg total) by mouth daily.   feeding supplement Liqd Take 237 mLs by mouth 3 (three) times daily between meals.   ferrous sulfate 325 (65 FE) MG tablet Take 1 tablet (325 mg total) by mouth 2 (two) times daily with a meal.   fluticasone furoate-vilanterol 100-25 MCG/ACT Aepb Commonly known as: BREO ELLIPTA Inhale 1 puff into the lungs daily.   furosemide 20 MG tablet Commonly known as: Lasix Hold due to low blood pressure. What changed:  how much to take how to take this when to take this additional instructions   loperamide 2 MG capsule Commonly known as: IMODIUM Take 1 capsule (2 mg total) by mouth as needed for diarrhea or loose stools.   mometasone-formoterol 100-5 MCG/ACT Aero Commonly known as: DULERA Inhale 2 puffs into the lungs 2 (two) times daily.   multivitamin with minerals Tabs tablet Take 1 tablet by mouth daily.   pantoprazole 40 MG tablet Commonly known as: PROTONIX Take 1 tablet (40 mg total) by mouth 2 (two) times daily.   rivaroxaban 20 MG Tabs tablet Commonly known as: XARELTO Take 1 tablet (20 mg total) by mouth daily. Hold until Wednesday. What changed: additional instructions  rosuvastatin 10 MG tablet Commonly known as: CRESTOR Take 1 tablet (10 mg total) by mouth daily. What changed: when to take this   vitamin C 1000 MG tablet Take 1,000 mg by mouth daily.        Follow-up [place]. Schedule an appointment as soon as possible for a visit in 1 week(s).   Specialty: General Practice Contact information: [place] [address] [phone]         Efrain Sella, MD. Schedule an appointment as soon as possible for a visit in 1 week(s).   Specialty: Gastroenterology Contact information: [place] [address] [phone]                Discharge Exam: Filed Weights   03/12/22 1527  03/14/22 1405  Weight: 79.4 kg 84.7 kg   General. In no acute distress. Pulmonary.  Lungs clear bilaterally, normal respiratory effort. CV.  Regular rate and rhythm, no JVD, rub or murmur. Abdomen.  Soft, nontender, nondistended, BS positive.  Nephrostomy tube in place. CNS.  Alert and oriented .  No focal neurologic deficit. Extremities.  No edema, no cyanosis, pulses intact and symmetrical. Psychiatry.  Judgment and insight appears normal.   Condition at discharge: stable  The results of significant diagnostics from this hospitalization (including imaging, microbiology, ancillary and laboratory) are listed below for reference.   Imaging Studies: CT ANGIO GI BLEED  Result Date: 03/12/2022 CLINICAL DATA:  Lower GI bleed (Ped 0-17y). Pt here with weakness and c/o her urinary bag not filling like it usually does. Pt also has back pain and states she just does not feel well EXAM: CTA ABDOMEN AND PELVIS WITHOUT AND WITH CONTRAST TECHNIQUE: Multidetector CT imaging of the abdomen and pelvis was performed using the standard protocol during bolus administration of intravenous contrast. Multiplanar reconstructed images and MIPs were obtained and reviewed to evaluate the vascular anatomy. RADIATION DOSE REDUCTION: This exam was performed according to the departmental dose-optimization program which includes automated exposure control, adjustment of the mA and/or kV according to patient size and/or use of iterative reconstruction technique. CONTRAST:  14m OMNIPAQUE IOHEXOL 350 MG/ML SOLN COMPARISON:  None Available. FINDINGS: VASCULAR No extravasation of contrast within the gastrointestinal tract to suggest active bleed. Aorta: Normal caliber aorta without aneurysm, dissection, vasculitis or significant stenosis. Celiac: Patent without evidence of aneurysm, dissection, vasculitis or significant stenosis. SMA: Patent without evidence of aneurysm, dissection, vasculitis or significant stenosis. Renals: Both  renal arteries are patent without evidence of aneurysm, dissection, vasculitis, fibromuscular dysplasia or significant stenosis. IMA: Patent without evidence of aneurysm, dissection, vasculitis or significant stenosis. Inflow: Inferior vena cava filter noted in grossly appropriate position. The main portal, splenic, superior mesenteric veins are patent. No definite thrombus within the IVC filter. Proximal Outflow: Bilateral common femoral and visualized portions of the superficial and profunda femoral arteries are patent without evidence of aneurysm, dissection, vasculitis or significant stenosis. Veins: No obvious venous abnormality within the limitations of this arterial phase study. Review of the MIP images confirms the above findings. NON-VASCULAR Lower chest: No acute abnormality. Hepatobiliary: No focal liver abnormality. No gallstones, gallbladder wall thickening, or pericholecystic fluid. No biliary dilatation. Pancreas: No focal lesion. Normal pancreatic contour. No surrounding inflammatory changes. No main pancreatic ductal dilatation. Spleen: Normal in size without focal abnormality. Adrenals/Urinary Tract: No adrenal nodule bilaterally. Percutaneous nephrostomy tube on the left with pigtail terminating within the left renal pelvis. Vague  urothelial thickening associated with this likely due to inflammation in the setting of hardware. Bilateral kidneys enhance symmetrically. Punctate right nephrolithiasis. No ureterolithiasis bilaterally. No left nephrolithiasis. No hydronephrosis. No hydroureter. The urinary bladder is decompressed with circumferential urinary bladder wall thickening. Vague perivesicular fat stranding. Stomach/Bowel: Stomach is within normal limits. No evidence of bowel wall thickening or dilatation. Appendix appears normal. Lymphatic: Asymmetric left periaortic prominent lymph nodes. No lymphadenopathy. Reproductive: Uterus and bilateral adnexa are unremarkable. Other: No  intraperitoneal free fluid. No intraperitoneal free gas. No organized fluid collection. Musculoskeletal: Subcutaneus soft tissue edema. No suspicious lytic or blastic osseous lesions. No acute displaced fracture. Grade 1 anterolisthesis of L5 on S1. Bilateral severe hip degenerative changes. IMPRESSION: VASCULAR 1. No CT evidence of active bleed. 2. No acute abdominal aorta abnormality. 3.  Aortic Atherosclerosis (ICD10-I70.0). NON-VASCULAR 1. Left percutaneous nephrostomy tube in appropriate position with associated vague urothelial thickening as well as mild perivesicular fat stranding. Recommend correlation with urinalysis to exclude superimposed infection. 2. Punctate nonobstructive right nephrolithiasis. Electronically Signed   By: Iven Finn M.D.   On: 03/12/2022 20:13   DG Chest 2 View  Result Date: 03/12/2022 CLINICAL DATA:  Sepsis. EXAM: CHEST - 2 VIEW COMPARISON:  February 23, 2022. FINDINGS: The heart size and mediastinal contours are within normal limits. Both lungs are clear. The visualized skeletal structures are unremarkable. IMPRESSION: No active cardiopulmonary disease. Electronically Signed   By: Marijo Conception M.D.   On: 03/12/2022 15:59   ECHOCARDIOGRAM COMPLETE  Result Date: 02/25/2022    ECHOCARDIOGRAM REPORT   Patient Name:   Courtney Mack Date of Exam: 02/25/2022 Medical Rec #:  [id number]          Height:       59.0 in Accession #:    [id number]         Weight:       175.0 lb Date of Birth:  09-01-1955          BSA:          1.743 m Patient Age:    14 years           BP:           113/61 mmHg Patient Gender: F                  HR:           73 bpm. Exam Location:  [facility] Procedure: 2D Echo, Cardiac Doppler and Color Doppler Indications:     R78.81 Bacteremia  History:         Patient has prior history of Echocardiogram examinations, most                  recent 09/12/2021. CHF; Risk Factors:Diabetes, Dyslipidemia and                  Hypertension.  Sonographer:     Rosalia Hammers  Referring Phys:  [id number] Karie Kirks Diagnosing Phys: Yolonda Kida MD  Sonographer Comments: Image acquisition challenging due to patient body habitus. IMPRESSIONS  1. Left ventricular ejection fraction, by estimation, is 65 to 70%. The left ventricle has normal function. The left ventricle has no regional wall motion abnormalities. Left ventricular diastolic parameters are consistent with Grade I diastolic dysfunction (impaired relaxation).  2. Right ventricular systolic function is normal. The right ventricular size is normal.  3. The mitral valve is normal in structure. No evidence of mitral valve regurgitation.  4. The aortic  valve is normal in structure. Aortic valve regurgitation is not visualized. FINDINGS  Left Ventricle: Left ventricular ejection fraction, by estimation, is 65 to 70%. The left ventricle has normal function. The left ventricle has no regional wall motion abnormalities. The left ventricular internal cavity size was normal in size. There is  no left ventricular hypertrophy. Left ventricular diastolic parameters are consistent with Grade I diastolic dysfunction (impaired relaxation). Right Ventricle: The right ventricular size is normal. No increase in right ventricular wall thickness. Right ventricular systolic function is normal. Left Atrium: Left atrial size was normal in size. Right Atrium: Right atrial size was normal in size. Pericardium: There is no evidence of pericardial effusion. Mitral Valve: The mitral valve is normal in structure. No evidence of mitral valve regurgitation. Tricuspid Valve: The tricuspid valve is normal in structure. Tricuspid valve regurgitation is mild. Aortic Valve: The aortic valve is normal in structure. Aortic valve regurgitation is not visualized. Aortic valve mean gradient measures 7.0 mmHg. Aortic valve peak gradient measures 11.8 mmHg. Aortic valve area, by VTI measures 1.99 cm. Pulmonic Valve: The pulmonic valve was normal in structure. Pulmonic valve  regurgitation is not visualized. Aorta: The ascending aorta was not well visualized. IAS/Shunts: No atrial level shunt detected by color flow Doppler.  LEFT VENTRICLE PLAX 2D LVIDd:         3.09 cm   Diastology LVIDs:         2.01 cm   LV e' medial:    7.29 cm/s LV PW:         1.34 cm   LV E/e' medial:  13.9 LV IVS:        0.95 cm   LV e' lateral:   8.27 cm/s LVOT diam:     1.80 cm   LV E/e' lateral: 12.2 LV SV:         74 LV SV Index:   43 LVOT Area:     2.54 cm  RIGHT VENTRICLE RV Basal diam:  2.93 cm RV S prime:     16.50 cm/s TAPSE (M-mode): 2.4 cm LEFT ATRIUM             Index        RIGHT ATRIUM           Index LA diam:        3.10 cm 1.78 cm/m   RA Area:     12.40 cm LA Vol (A2C):   45.2 ml 25.94 ml/m  RA Volume:   23.70 ml  13.60 ml/m LA Vol (A4C):   29.3 ml 16.81 ml/m LA Biplane Vol: 38.5 ml 22.09 ml/m  AORTIC VALVE AV Area (Vmax):    2.00 cm AV Area (Vmean):   1.78 cm AV Area (VTI):     1.99 cm AV Vmax:           172.00 cm/s AV Vmean:          127.000 cm/s AV VTI:            0.373 m AV Peak Grad:      11.8 mmHg AV Mean Grad:      7.0 mmHg LVOT Vmax:         135.00 cm/s LVOT Vmean:        88.800 cm/s LVOT VTI:          0.292 m LVOT/AV VTI ratio: 0.78  AORTA Ao Root diam: 3.60 cm MITRAL VALVE MV Area (PHT): 3.58 cm     SHUNTS MV Decel  Time: 212 msec     Systemic VTI:  0.29 m MV E velocity: 101.00 cm/s  Systemic Diam: 1.80 cm MV A velocity: 127.00 cm/s MV E/A ratio:  0.80 Dwayne D Callwood MD Electronically signed by Yolonda Kida MD Signature Date/Time: 02/25/2022/5:27:16 PM    Final    IR NEPHROSTOMY EXCHANGE LEFT  Result Date: 02/25/2022 INDICATION: 66 year old woman with left obstructive uropathy presents to IR for routine exchange of nephrostomy drain. EXAM: Fluoroscopy guided left nephrostomy drain exchange COMPARISON:  None Available. MEDICATIONS: None ANESTHESIA/SEDATION: None CONTRAST:  4 mL of Omnipaque 350-administered into the collecting system(s) FLUOROSCOPY TIME:  Radiation  Exposure Index (as provided by the fluoroscopic device): 4.2 mGy Kerma COMPLICATIONS: None immediate. PROCEDURE: Informed written consent was obtained from the patient after a thorough discussion of the procedural risks, benefits and alternatives. All questions were addressed. Maximal Sterile Barrier Technique was utilized including caps, mask, sterile gowns, sterile gloves, sterile drape, hand hygiene and skin antiseptic. A timeout was performed prior to the initiation of the procedure. Patient position prone on the procedure table. The external segment of the existing 10.2 [place] multipurpose pigtail drain and surrounding skin prepped and draped usual fashion. Contrast administered through the drain under fluoroscopy confirmed appropriate positioning within the renal pelvis. The drain was cut and removed over 0.035 inch guidewire and replaced with an identical 10.2 [place] multipurpose pigtail drain. Contrast administered through the new drain under fluoroscopy showed appropriate positioning within the renal pelvis. Drain secured to skin with suture and connected to bag. IMPRESSION: Routine exchange of left percutaneous for ostomy drain with new 10.2 French catheter in place. PLAN: Return in 8 weeks for routine exchange. Electronically Signed   By: Miachel Roux M.D.   On: 02/25/2022 15:10   CT Angio Chest PE W and/or Wo Contrast  Result Date: 02/24/2022 CLINICAL DATA:  Pulmonary embolism (PE) suspected, positive D-dimer; Abdominal pain, acute, nonlocalized back pain, nephromstomy tube on Left EXAM: CT ANGIOGRAPHY CHEST CT ABDOMEN AND PELVIS WITH CONTRAST TECHNIQUE: Multidetector CT imaging of the chest was performed using the standard protocol during bolus administration of intravenous contrast. Multiplanar CT image reconstructions and MIPs were obtained to evaluate the vascular anatomy. Multidetector CT imaging of the abdomen and pelvis was performed using the standard protocol during bolus administration of  intravenous contrast. RADIATION DOSE REDUCTION: This exam was performed according to the departmental dose-optimization program which includes automated exposure control, adjustment of the mA and/or kV according to patient size and/or use of iterative reconstruction technique. CONTRAST:  34m OMNIPAQUE IOHEXOL 350 MG/ML SOLN COMPARISON:  11/17/2021 FINDINGS: CTA CHEST FINDINGS Cardiovascular: There is adequate opacification of the pulmonary arterial tree. Tiny web identified within the a right lower lobar pulmonary artery in keeping with chronic pulmonary embolus, stable since prior examination. No acute pulmonary embolus identified. Central pulmonary arteries are of normal caliber. Minimal coronary artery calcification. Global cardiac size within normal limits. No pericardial effusion. Mild atherosclerotic calcification within the thoracic aorta. No aortic aneurysm. Mediastinum/Nodes: No enlarged mediastinal, hilar, or axillary lymph nodes. Thyroid gland, trachea, and esophagus demonstrate no significant findings. Lungs/Pleura: Subpleural architectural distortion and interstitial thickening within the a anterior left upper lobe is stable since prior examination in keeping with focal fibrosis. There is diffuse progressive bronchial wall thickening and new extensive airway impaction within the lower lobes bilaterally in keeping with airway inflammation. Extensive endobronchial debris can be seen in the setting of superimposed acute infection or aspiration. Mosaic attenuation of the pulmonary parenchyma is in keeping  with multifocal air trapping likely related to small airways disease. No pneumothorax or pleural effusion. Musculoskeletal: No acute bone abnormality. Osseous structures are age-appropriate. Review of the MIP images confirms the above findings. CT ABDOMEN and PELVIS FINDINGS Hepatobiliary: No focal liver abnormality is seen. No gallstones, gallbladder wall thickening, or biliary dilatation. Pancreas:  Unremarkable Spleen: Unremarkable Adrenals/Urinary Tract: The adrenal glands are unremarkable. The kidneys are normal in size and position. Left percutaneous nephrostomy catheter has been placed and is in expected position with its retaining loop formed within the left renal pelvis. Interval removal of left double-J ureteral stent. No hydronephrosis. 3 mm nonobstructing calculus is noted within the lower pole of the right kidney. There is persistent marked circumferential perivesicular inflammatory stranding suggesting an underlying infectious or inflammatory cystitis. The bladder is decompressed. Stomach/Bowel: Stomach is within normal limits. Appendix appears normal. No evidence of bowel wall thickening, distention, or inflammatory changes. Vascular/Lymphatic: Mild aortoiliac atherosclerotic calcification. Prominent atherosclerotic calcification noted at the origin of the mesenteric and renal vasculature suspected hemodynamically significant stenoses of the celiac axis and superior mesenteric artery proximally. This is not well assessed, however, on this non arteriographic examination. Infrarenal inferior vena cava filter is unchanged in position, fixed just below the confluence of the retroaortic left renal vein. There is marked narrowing of the common iliac and proximal inferior vena cava suggesting chronic thrombosis, unchanged from prior examination. No pathologic adenopathy within the abdomen and pelvis. Reproductive: Uterus and bilateral adnexa are unremarkable. 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 This does appear slightly progressive since prior examination, however. No abdominal wall hernia Musculoskeletal: Advanced degenerative changes are noted within the hips bilaterally. No acute bone abnormality. No lytic or blastic bone lesion. Review of  the MIP images confirms the above findings. IMPRESSION: 1. No acute pulmonary embolus. 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 2. Progressive bronchial wall thickening and extensive airway impaction within the lower lobes bilaterally in keeping with airway inflammation. Extensive endobronchial debris can be seen in the setting of superimposed acute infection or aspiration. 3. Mosaic attenuation of the pulmonary parenchyma in keeping with multifocal air trapping likely related to small airways disease. 4. Minimal coronary artery calcification. 5. Interval removal of left double-J ureteral stent and placement of a left percutaneous nephrostomy catheter in expected position. No hydronephrosis. 6. Minimal nonobstructing right nephrolithiasis. 7. Persistent marked circumferential perivesicular inflammatory stranding suggesting an underlying infectious or inflammatory cystitis. Correlation with urinalysis and urine culture may be helpful. 8. 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 9. Peripheral vascular disease with suspected hemodynamically significant stenoses of the celiac axis and superior mesenteric artery proximally. Clinical correlation for signs and symptoms of chronic mesenteric ischemia recommended. If indicated, this would be better assessed with CT arteriography. 10.  Aortic Atherosclerosis (ICD10-I70.0). Electronically Signed   By: Fidela Salisbury M.D.   On: 02/24/2022 01:21   CT ABDOMEN PELVIS W CONTRAST  Result Date: 02/24/2022 CLINICAL DATA:  Pulmonary embolism (PE) suspected, positive D-dimer; Abdominal pain, acute, nonlocalized back pain, nephromstomy tube on Left EXAM: CT ANGIOGRAPHY CHEST CT ABDOMEN AND PELVIS WITH CONTRAST TECHNIQUE: Multidetector CT  imaging of the chest was performed using the standard protocol  during bolus administration of intravenous contrast. Multiplanar CT image reconstructions and MIPs were obtained to evaluate the vascular anatomy. Multidetector CT imaging of the abdomen and pelvis was performed using the standard protocol during bolus administration of intravenous contrast. RADIATION DOSE REDUCTION: This exam was performed according to the departmental dose-optimization program which includes automated exposure control, adjustment of the mA and/or kV according to patient size and/or use of iterative reconstruction technique. CONTRAST:  29m OMNIPAQUE IOHEXOL 350 MG/ML SOLN COMPARISON:  11/17/2021 FINDINGS: CTA CHEST FINDINGS Cardiovascular: There is adequate opacification of the pulmonary arterial tree. Tiny web identified within the a right lower lobar pulmonary artery in keeping with chronic pulmonary embolus, stable since prior examination. No acute pulmonary embolus identified. Central pulmonary arteries are of normal caliber. Minimal coronary artery calcification. Global cardiac size within normal limits. No pericardial effusion. Mild atherosclerotic calcification within the thoracic aorta. No aortic aneurysm. Mediastinum/Nodes: No enlarged mediastinal, hilar, or axillary lymph nodes. Thyroid gland, trachea, and esophagus demonstrate no significant findings. Lungs/Pleura: Subpleural architectural distortion and interstitial thickening within the a anterior left upper lobe is stable since prior examination in keeping with focal fibrosis. There is diffuse progressive bronchial wall thickening and new extensive airway impaction within the lower lobes bilaterally in keeping with airway inflammation. Extensive endobronchial debris can be seen in the setting of superimposed acute infection or aspiration. Mosaic attenuation of the pulmonary parenchyma is in keeping with multifocal air trapping likely related to small airways disease. No  pneumothorax or pleural effusion. Musculoskeletal: No acute bone abnormality. Osseous structures are age-appropriate. Review of the MIP images confirms the above findings. CT ABDOMEN and PELVIS FINDINGS Hepatobiliary: No focal liver abnormality is seen. No gallstones, gallbladder wall thickening, or biliary dilatation. Pancreas: Unremarkable Spleen: Unremarkable Adrenals/Urinary Tract: The adrenal glands are unremarkable. The kidneys are normal in size and position. Left percutaneous nephrostomy catheter has been placed and is in expected position with its retaining loop formed within the left renal pelvis. Interval removal of left double-J ureteral stent. No hydronephrosis. 3 mm nonobstructing calculus is noted within the lower pole of the right kidney. There is persistent marked circumferential perivesicular inflammatory stranding suggesting an underlying infectious or inflammatory cystitis. The bladder is decompressed. Stomach/Bowel: Stomach is within normal limits. Appendix appears normal. No evidence of bowel wall thickening, distention, or inflammatory changes. Vascular/Lymphatic: Mild aortoiliac atherosclerotic calcification. Prominent atherosclerotic calcification noted at the origin of the mesenteric and renal vasculature suspected hemodynamically significant stenoses of the celiac axis and superior mesenteric artery proximally. This is not well assessed, however, on this non arteriographic examination. Infrarenal inferior vena cava filter is unchanged in position, fixed just below the confluence of the retroaortic left renal vein. There is marked narrowing of the common iliac and proximal inferior vena cava suggesting chronic thrombosis, unchanged from prior examination. No pathologic adenopathy within the abdomen and pelvis. Reproductive: Uterus and bilateral adnexa are unremarkable. Other: There is diffuse subcutaneous body wall edema involving the lower abdominal wall and visualized lower extremities  bilaterally which may relate to chronic thrombosis of the inferior vena cava. This does appear slightly progressive since prior examination, however. No abdominal wall hernia Musculoskeletal: Advanced degenerative changes are noted within the hips bilaterally. No acute bone abnormality. No lytic or blastic bone lesion. Review of the MIP images confirms the above findings. IMPRESSION: 1. No acute pulmonary embolus. 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 2. Progressive bronchial wall thickening and extensive airway impaction within the lower lobes bilaterally in  keeping with airway inflammation. Extensive endobronchial debris can be seen in the setting of superimposed acute infection or aspiration. 3. Mosaic attenuation of the pulmonary parenchyma in keeping with multifocal air trapping likely related to small airways disease. 4. Minimal coronary artery calcification. 5. Interval removal of left double-J ureteral stent and placement of a left percutaneous nephrostomy catheter in expected position. No hydronephrosis. 6. Minimal nonobstructing right nephrolithiasis. 7. Persistent marked circumferential perivesicular inflammatory stranding suggesting an underlying infectious or inflammatory cystitis. Correlation with urinalysis and urine culture may be helpful. 8. 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 9. Peripheral vascular disease with suspected hemodynamically significant stenoses of the celiac axis and superior mesenteric artery proximally. Clinical correlation for signs and symptoms of chronic mesenteric ischemia recommended. If indicated, this would be better assessed with CT arteriography. 10.  Aortic Atherosclerosis (ICD10-I70.0). Electronically Signed   By:  Fidela Salisbury M.D.   On: 02/24/2022 01:21   US Venous Img Lower Bilateral (DVT)  Result Date: 02/24/2022 CLINICAL DATA:  Leg swelling.  History of DVT EXAM: BILATERAL LOWER EXTREMITY VENOUS DOPPLER ULTRASOUND TECHNIQUE: Gray-scale sonography with graded compression, as well as color Doppler and duplex ultrasound were performed to evaluate the lower extremity deep venous systems from the level of the common femoral vein and including the common femoral, femoral, profunda femoral, popliteal and calf veins including the posterior tibial, peroneal and gastrocnemius veins when visible. The superficial great saphenous vein was also interrogated. Spectral Doppler was utilized to evaluate flow at rest and with distal augmentation maneuvers in the common femoral, femoral and popliteal veins. COMPARISON:  None Available. FINDINGS: RIGHT LOWER EXTREMITY Common Femoral Vein: Again noted is chronic partially occlusive DVT. Partial compressibility. Saphenofemoral Junction: Partially occlusive chronic thrombus noted. Profunda Femoral Vein: No evidence of thrombus. Normal compressibility and flow on color Doppler imaging. Femoral Vein: Partially occlusive chronic thrombus noted in the proximal femoral vein. No flow seen within the mid and distal femoral vein compatible with occlusive thrombosis. Popliteal Vein: No evidence of thrombus. Normal compressibility, respiratory phasicity and response to augmentation. Calf Veins: Not visualized Superficial Great Saphenous Vein: Not visualized Venous Reflux:  Not assessed Other Findings:  None. LEFT LOWER EXTREMITY Common Femoral Vein: Again noted is chronic partially occlusive thrombus. Partial compressibility. Saphenofemoral Junction: Partially occlusive chronic thrombus noted. Profunda Femoral Vein: No evidence of thrombus. Normal compressibility and flow on color Doppler imaging. Femoral Vein: Partially occlusive chronic thrombus again noted. Popliteal Vein: Partially occlusive  chronic thrombus again noted. Calf Veins: Not visualized Superficial Great Saphenous Vein: Not visualized Venous Reflux:  Not assessed Other Findings:  None IMPRESSION: Chronic partially occlusive thrombus noted within the right common femoral vein and proximal superficial femoral vein. Occlusive thrombus noted in the mid to distal right femoral vein. This was partially occluded previously suggesting an acute component. Chronic partially occlusive thrombus in the left common femoral vein, superficial femoral vein and popliteal vein. Electronically Signed   By: Rolm Baptise M.D.   On: 02/24/2022 00:43   DG Chest Port 1 View  Result Date: 02/23/2022 CLINICAL DATA:  Dyspnea EXAM: PORTABLE CHEST 1 VIEW COMPARISON:  None Available. FINDINGS: The heart size and mediastinal contours are within normal limits. Both lungs are clear. The visualized skeletal structures are unremarkable. IMPRESSION: No active disease. Electronically Signed   By: Linwood Dibbles.D.  On: 02/23/2022 23:52    Microbiology: Results for orders placed or performed during the hospital encounter of 03/12/22  Culture, blood (Routine x 2)     Status: None (Preliminary result)   Collection Time: 03/12/22  3:30 PM   Specimen: BLOOD  Result Value Ref Range Status   Specimen Description BLOOD LEFT ANTECUBITAL  Final   Special Requests   Final    BOTTLES DRAWN AEROBIC AND ANAEROBIC Blood Culture adequate volume   Culture   Final    NO GROWTH 4 DAYS Performed at [facility], [address] [place], [place] [address]    Report Status PENDING  Incomplete  Urine Culture     Status: Abnormal   Collection Time: 03/13/22  3:39 PM   Specimen: Urine, Clean Catch  Result Value Ref Range Status   Specimen Description   Final    URINE, CLEAN CATCH Performed at [facility], [address] [place], [place] [address]    Special Requests   Final    NONE Performed at [facility], [address]  [place], [place] [address]    Culture >=100,000 COLONIES/mL PROTEUS PENNERI (A)  Final   Report Status 03/16/2022 FINAL  Final   Organism ID, Bacteria PROTEUS PENNERI (A)  Final      Susceptibility   Proteus penneri - MIC*    AMPICILLIN >=32 RESISTANT Resistant     CEFAZOLIN >=64 RESISTANT Resistant     CEFEPIME 1 SENSITIVE Sensitive     CEFTRIAXONE 32 RESISTANT Resistant     CIPROFLOXACIN <=0.25 SENSITIVE Sensitive     GENTAMICIN <=1 SENSITIVE Sensitive     IMIPENEM 2 SENSITIVE Sensitive     NITROFURANTOIN 128 RESISTANT Resistant     TRIMETH/SULFA <=20 SENSITIVE Sensitive     AMPICILLIN/SULBACTAM >=32 RESISTANT Resistant     PIP/TAZO <=4 SENSITIVE Sensitive     * >=100,000 COLONIES/mL PROTEUS PENNERI  Culture, blood (Routine X 2) w Reflex to ID Panel     Status: None (Preliminary result)   Collection Time: 03/14/22 12:32 PM   Specimen: BLOOD RIGHT HAND  Result Value Ref Range Status   Specimen Description BLOOD RIGHT HAND  Final   Special Requests   Final    BOTTLES DRAWN AEROBIC AND ANAEROBIC Blood Culture adequate volume   Culture   Final    NO GROWTH 2 DAYS Performed at [facility], [address] [place], [place] [address]    Report Status PENDING  Incomplete    Labs: CBC: Recent Labs  Lab 03/12/22 1530 03/13/22 0353 03/13/22 1541 03/14/22 0553 03/15/22 0506 03/16/22 0543  WBC 6.1 4.6  --  5.0 4.9 4.8  NEUTROABS 4.4  --   --   --   --   --   HGB 5.5* 6.6* 8.4* 7.4* 7.9* 7.8*  HCT 19.5* 21.8* 27.4* 24.1* 25.3* 26.3*  MCV 84.1 85.5  --  83.4 81.6 82.2  PLT 322 228  --  232 224 364   Basic Metabolic Panel: Recent Labs  Lab 03/12/22 1530  NA 140  K 3.5  CL 101  CO2 28  GLUCOSE 133*  BUN 27*  CREATININE 0.83  CALCIUM 9.4   Liver Function Tests: Recent Labs  Lab 03/12/22 1530  AST 18  ALT 11  ALKPHOS 43  BILITOT 1.1  PROT 7.1  ALBUMIN 3.6   CBG: No results for input(s): "GLUCAP" in the last 168 hours.  Discharge time spent: greater  than 30 minutes.  This  record has been created using Systems analyst. Errors have been sought and corrected,but may not always be located. Such creation errors do not reflect on the standard of care.   Signed: Lorella Nimrod, MD [facility] 03/16/2022

## 2022-03-17 LAB — CULTURE, BLOOD (ROUTINE X 2)
Culture: NO GROWTH
Special Requests: ADEQUATE

## 2022-03-19 LAB — CULTURE, BLOOD (ROUTINE X 2)
Culture: NO GROWTH
Special Requests: ADEQUATE

## 2022-03-26 ENCOUNTER — Encounter: Payer: Self-pay | Admitting: Emergency Medicine

## 2022-03-26 ENCOUNTER — Inpatient Hospital Stay
Admission: EM | Admit: 2022-03-26 | Discharge: 2022-03-29 | DRG: 699 | Disposition: A | Discharge status: Home / Self Care | Payer: Medicare HMO | Attending: Internal Medicine | Admitting: Internal Medicine

## 2022-03-26 ENCOUNTER — Emergency Department: Payer: Medicare HMO

## 2022-03-26 DIAGNOSIS — I5032 Chronic diastolic (congestive) heart failure: Secondary | ICD-10-CM | POA: Diagnosis present

## 2022-03-26 DIAGNOSIS — Z83438 Family history of other disorder of lipoprotein metabolism and other lipidemia: Secondary | ICD-10-CM | POA: Diagnosis not present

## 2022-03-26 DIAGNOSIS — K219 Gastro-esophageal reflux disease without esophagitis: Secondary | ICD-10-CM | POA: Diagnosis present

## 2022-03-26 DIAGNOSIS — Z79899 Other long term (current) drug therapy: Secondary | ICD-10-CM

## 2022-03-26 DIAGNOSIS — Z86711 Personal history of pulmonary embolism: Secondary | ICD-10-CM | POA: Diagnosis not present

## 2022-03-26 DIAGNOSIS — D509 Iron deficiency anemia, unspecified: Secondary | ICD-10-CM | POA: Diagnosis present

## 2022-03-26 DIAGNOSIS — T83512A Infection and inflammatory reaction due to nephrostomy catheter, initial encounter: Principal | ICD-10-CM | POA: Diagnosis present

## 2022-03-26 DIAGNOSIS — Z8249 Family history of ischemic heart disease and other diseases of the circulatory system: Secondary | ICD-10-CM

## 2022-03-26 DIAGNOSIS — N179 Acute kidney failure, unspecified: Secondary | ICD-10-CM | POA: Diagnosis present

## 2022-03-26 DIAGNOSIS — E119 Type 2 diabetes mellitus without complications: Secondary | ICD-10-CM | POA: Diagnosis present

## 2022-03-26 DIAGNOSIS — Z87440 Personal history of urinary (tract) infections: Secondary | ICD-10-CM | POA: Diagnosis not present

## 2022-03-26 DIAGNOSIS — I248 Other forms of acute ischemic heart disease: Secondary | ICD-10-CM | POA: Diagnosis not present

## 2022-03-26 DIAGNOSIS — I890 Lymphedema, not elsewhere classified: Secondary | ICD-10-CM

## 2022-03-26 DIAGNOSIS — Z6838 Body mass index (BMI) 38.0-38.9, adult: Secondary | ICD-10-CM | POA: Diagnosis not present

## 2022-03-26 DIAGNOSIS — Z8616 Personal history of COVID-19: Secondary | ICD-10-CM | POA: Diagnosis not present

## 2022-03-26 DIAGNOSIS — Y842 Radiological procedure and radiotherapy as the cause of abnormal reaction of the patient, or of later complication, without mention of misadventure at the time of the procedure: Secondary | ICD-10-CM | POA: Diagnosis present

## 2022-03-26 DIAGNOSIS — I110 Hypertensive heart disease with heart failure: Secondary | ICD-10-CM | POA: Diagnosis present

## 2022-03-26 DIAGNOSIS — Z833 Family history of diabetes mellitus: Secondary | ICD-10-CM | POA: Diagnosis not present

## 2022-03-26 DIAGNOSIS — N1330 Unspecified hydronephrosis: Secondary | ICD-10-CM | POA: Diagnosis present

## 2022-03-26 DIAGNOSIS — E876 Hypokalemia: Secondary | ICD-10-CM | POA: Diagnosis present

## 2022-03-26 DIAGNOSIS — Y833 Surgical operation with formation of external stoma as the cause of abnormal reaction of the patient, or of later complication, without mention of misadventure at the time of the procedure: Secondary | ICD-10-CM | POA: Diagnosis present

## 2022-03-26 DIAGNOSIS — Z86718 Personal history of other venous thrombosis and embolism: Secondary | ICD-10-CM | POA: Diagnosis not present

## 2022-03-26 DIAGNOSIS — E66813 Obesity, class 3: Secondary | ICD-10-CM | POA: Diagnosis present

## 2022-03-26 DIAGNOSIS — N3040 Irradiation cystitis without hematuria: Secondary | ICD-10-CM | POA: Diagnosis present

## 2022-03-26 DIAGNOSIS — D649 Anemia, unspecified: Secondary | ICD-10-CM | POA: Diagnosis present

## 2022-03-26 DIAGNOSIS — C541 Malignant neoplasm of endometrium: Secondary | ICD-10-CM | POA: Diagnosis present

## 2022-03-26 DIAGNOSIS — E7800 Pure hypercholesterolemia, unspecified: Secondary | ICD-10-CM | POA: Diagnosis present

## 2022-03-26 DIAGNOSIS — N390 Urinary tract infection, site not specified: Secondary | ICD-10-CM | POA: Diagnosis present

## 2022-03-26 DIAGNOSIS — K31819 Angiodysplasia of stomach and duodenum without bleeding: Secondary | ICD-10-CM | POA: Diagnosis present

## 2022-03-26 DIAGNOSIS — E785 Hyperlipidemia, unspecified: Secondary | ICD-10-CM | POA: Diagnosis present

## 2022-03-26 DIAGNOSIS — Z8542 Personal history of malignant neoplasm of other parts of uterus: Secondary | ICD-10-CM

## 2022-03-26 DIAGNOSIS — Z7951 Long term (current) use of inhaled steroids: Secondary | ICD-10-CM

## 2022-03-26 DIAGNOSIS — Z7901 Long term (current) use of anticoagulants: Secondary | ICD-10-CM

## 2022-03-26 LAB — COMPREHENSIVE METABOLIC PANEL
ALT: 15 U/L (ref 0–44)
AST: 24 U/L (ref 15–41)
Albumin: 2.9 g/dL — ABNORMAL LOW (ref 3.5–5.0)
Alkaline Phosphatase: 46 U/L (ref 38–126)
Anion gap: 7 (ref 5–15)
BUN: 28 mg/dL — ABNORMAL HIGH (ref 8–23)
CO2: 23 mmol/L (ref 22–32)
Calcium: 8.2 mg/dL — ABNORMAL LOW (ref 8.9–10.3)
Chloride: 104 mmol/L (ref 98–111)
Creatinine, Ser: 1.11 mg/dL — ABNORMAL HIGH (ref 0.44–1.00)
GFR, Estimated: 54 mL/min — ABNORMAL LOW (ref >60)
Glucose, Bld: 110 mg/dL — ABNORMAL HIGH (ref 70–99)
Potassium: 3.1 mmol/L — ABNORMAL LOW (ref 3.5–5.1)
Sodium: 134 mmol/L — ABNORMAL LOW (ref 135–145)
Total Bilirubin: 1.4 mg/dL — ABNORMAL HIGH (ref 0.3–1.2)
Total Protein: 6.6 g/dL (ref 6.5–8.1)

## 2022-03-26 LAB — URINALYSIS, COMPLETE (UACMP) WITH MICROSCOPIC
Bilirubin Urine: NEGATIVE
Glucose, UA: NEGATIVE mg/dL
Ketones, ur: NEGATIVE mg/dL
Nitrite: NEGATIVE
Protein, ur: 100 mg/dL — AB
Specific Gravity, Urine: 1.008 (ref 1.005–1.030)
Squamous Epithelial / HPF: NONE SEEN (ref 0–5)
pH: 9 — ABNORMAL HIGH (ref 5.0–8.0)

## 2022-03-26 LAB — CBC
HCT: 24.5 % — ABNORMAL LOW (ref 36.0–46.0)
Hemoglobin: 7.3 g/dL — ABNORMAL LOW (ref 12.0–15.0)
MCH: 24.2 pg — ABNORMAL LOW (ref 26.0–34.0)
MCHC: 29.8 g/dL — ABNORMAL LOW (ref 30.0–36.0)
MCV: 81.1 fL (ref 80.0–100.0)
Platelets: 265 10*3/uL (ref 150–400)
RBC: 3.02 MIL/uL — ABNORMAL LOW (ref 3.87–5.11)
RDW: 20.3 % — ABNORMAL HIGH (ref 11.5–15.5)
WBC: 5.8 10*3/uL (ref 4.0–10.5)
nRBC: 0 % (ref 0.0–0.2)

## 2022-03-26 LAB — LACTIC ACID, PLASMA
Lactic Acid, Venous: 1.7 mmol/L (ref 0.5–1.9)
Lactic Acid, Venous: 1.6 mmol/L (ref 0.5–1.9)

## 2022-03-26 LAB — TROPONIN I (HIGH SENSITIVITY)
Troponin I (High Sensitivity): 9 ng/L (ref <18)
Troponin I (High Sensitivity): 8 ng/L (ref <18)

## 2022-03-26 LAB — BRAIN NATRIURETIC PEPTIDE: B Natriuretic Peptide: 86.5 pg/mL (ref 0.0–100.0)

## 2022-03-26 LAB — LIPASE, BLOOD: Lipase: 28 U/L (ref 11–51)

## 2022-03-26 MED ORDER — MOMETASONE FURO-FORMOTEROL FUM 100-5 MCG/ACT IN AERO: 2.0000 | INHALATION_SPRAY | Freq: Two times a day (BID) | RESPIRATORY_TRACT | Status: DC

## 2022-03-26 MED ORDER — SODIUM CHLORIDE 0.9 % IV BOLUS
500.0000 mL | Freq: Once | INTRAVENOUS | Status: AC
  Administered 2022-03-26: 500 mL via INTRAVENOUS

## 2022-03-26 MED ORDER — MELATONIN 5 MG PO TABS
5.0000 mg | ORAL_TABLET | Freq: Every evening | ORAL | Status: DC | PRN
  Administered 2022-03-27 – 2022-03-28 (×2): 5 mg via ORAL
  Filled 2022-03-26 (×2): qty 1

## 2022-03-26 MED ORDER — SODIUM CHLORIDE 0.9 % IV SOLN
2.0000 g | Freq: Once | INTRAVENOUS | Status: AC
  Administered 2022-03-26: 2 g via INTRAVENOUS
  Filled 2022-03-26: qty 12.5

## 2022-03-26 MED ORDER — ACETAMINOPHEN 325 MG PO TABS
650.0000 mg | ORAL_TABLET | Freq: Four times a day (QID) | ORAL | Status: DC | PRN
  Administered 2022-03-27 – 2022-03-28 (×2): 650 mg via ORAL
  Filled 2022-03-26 (×2): qty 2

## 2022-03-26 MED ORDER — VITAMIN B-12 1000 MCG PO TABS
1000.0000 ug | ORAL_TABLET | Freq: Every day | ORAL | Status: DC
  Administered 2022-03-27 – 2022-03-29 (×3): 1000 ug via ORAL
  Filled 2022-03-26 (×3): qty 1

## 2022-03-26 MED ORDER — ADULT MULTIVITAMIN W/MINERALS CH
1.0000 | ORAL_TABLET | Freq: Every day | ORAL | Status: DC
  Administered 2022-03-27 – 2022-03-29 (×3): 1 via ORAL
  Filled 2022-03-26 (×3): qty 1

## 2022-03-26 MED ORDER — ROSUVASTATIN CALCIUM 10 MG PO TABS
10.0000 mg | ORAL_TABLET | Freq: Every morning | ORAL | Status: DC
  Administered 2022-03-27 – 2022-03-29 (×3): 10 mg via ORAL
  Filled 2022-03-26 (×3): qty 1

## 2022-03-26 MED ORDER — ONDANSETRON HCL 4 MG/2ML IJ SOLN: 4.0000 mg | Freq: Four times a day (QID) | INTRAMUSCULAR | Status: DC | PRN

## 2022-03-26 MED ORDER — ENOXAPARIN SODIUM 40 MG/0.4ML IJ SOSY: 40.0000 mg | PREFILLED_SYRINGE | INTRAMUSCULAR | Status: DC

## 2022-03-26 MED ORDER — POLYETHYLENE GLYCOL 3350 17 G PO PACK: 17.0000 g | PACK | Freq: Every day | ORAL | Status: DC | PRN

## 2022-03-26 MED ORDER — SODIUM CHLORIDE 0.9 % IV SOLN: INTRAVENOUS | Status: DC

## 2022-03-26 MED ORDER — FERROUS SULFATE 325 (65 FE) MG PO TABS
325.0000 mg | ORAL_TABLET | Freq: Two times a day (BID) | ORAL | Status: DC
  Administered 2022-03-27 (×2): 325 mg via ORAL
  Filled 2022-03-26 (×2): qty 1

## 2022-03-26 MED ORDER — PANTOPRAZOLE SODIUM 40 MG PO TBEC
40.0000 mg | DELAYED_RELEASE_TABLET | Freq: Two times a day (BID) | ORAL | Status: DC
  Administered 2022-03-26 – 2022-03-29 (×6): 40 mg via ORAL
  Filled 2022-03-26 (×5): qty 1

## 2022-03-26 MED ORDER — RIVAROXABAN 20 MG PO TABS
20.0000 mg | ORAL_TABLET | Freq: Every day | ORAL | Status: DC
  Filled 2022-03-26: qty 1

## 2022-03-26 MED ORDER — ENSURE ENLIVE PO LIQD
237.0000 mL | Freq: Three times a day (TID) | ORAL | Status: DC
  Administered 2022-03-28 – 2022-03-29 (×5): 237 mL via ORAL

## 2022-03-26 MED ORDER — FLUTICASONE FUROATE-VILANTEROL 100-25 MCG/ACT IN AEPB
1.0000 | INHALATION_SPRAY | Freq: Every day | RESPIRATORY_TRACT | Status: DC
  Administered 2022-03-27 – 2022-03-29 (×3): 1 via RESPIRATORY_TRACT
  Filled 2022-03-26: qty 28

## 2022-03-26 MED ORDER — VITAMIN C 500 MG PO TABS: 1000.0000 mg | ORAL_TABLET | Freq: Every day | ORAL | Status: DC

## 2022-03-26 NOTE — ED Provider Notes (Signed)
[facility] Provider Note    Event Date/Time   First MD Initiated Contact with Patient 03/26/22 2100     (approximate)   History   Urinary Retention   HPI  Courtney Mack is a 66 y.o. female with a history of anemia, DVT, anticoagulant use   Follows with urology Dr. Bernardo Heater.  Percutaneous nephrostomy with a history of prior left-sided hydronephrosis  Patient has been experiencing fatigue, pain in the left flank and has noticed less output from her urostomy tube.  She has however noticed that it has started draining once again this evening but she has noticed that his cloudy in appearance which is not typical  She has been having a feeling like she is having fevers and chills for about 2 days but has not registered a fever at home.  No nausea or vomiting.  No chest pain.  No black stool she has had a history of black stool in the past but denies that she has any any evidence of bleeding  Physical Exam   Triage Vital Signs: ED Triage Vitals [03/26/22 1925]  Enc Vitals Group     BP (!) 93/55     Pulse Rate 73     Resp 18     Temp 98.7 F (37.1 C)     Temp Source Oral     SpO2 100 %     Weight      Height      Head Circumference      Peak Flow      Pain Score      Pain Loc      Pain Edu?      Excl. in [place]?     Most recent vital signs: Vitals:   03/26/22 1925 03/26/22 2102  BP: (!) 93/55 (!) 99/54  Pulse: 73 88  Resp: 18 18  Temp: 98.7 F (37.1 C)   SpO2: 100% 98%    General: Awake, no distress.  Pleasant.  Appears generally fatigued but in no acute distress or extremis CV:  Good peripheral perfusion.  Normal heart sounds Resp:  Normal effort.  Clear lungs. Abd:  No distention.  Nontender except reports mild discomfort around the left flank.  Urostomy tube site clean dry intact.  Urostomy draining approximately 150 mL of cloudy appearing urine at this time without evidence of blood tingeing. Other:  Moderate bilateral lower extremity  edema   ED Results / Procedures / Treatments   Labs (all labs ordered are listed, but only abnormal results are displayed) Labs Reviewed  CBC - Abnormal; Notable for the following components:      Result Value   RBC 3.02 (*)    Hemoglobin 7.3 (*)    HCT 24.5 (*)    MCH 24.2 (*)    MCHC 29.8 (*)    RDW 20.3 (*)    All other components within normal limits  COMPREHENSIVE METABOLIC PANEL - Abnormal; Notable for the following components:   Sodium 134 (*)    Potassium 3.1 (*)    Glucose, Bld 110 (*)    BUN 28 (*)    Creatinine, Ser 1.11 (*)    Calcium 8.2 (*)    Albumin 2.9 (*)    Total Bilirubin 1.4 (*)    GFR, Estimated 54 (*)    All other components within normal limits  URINALYSIS, COMPLETE (UACMP) WITH MICROSCOPIC - Abnormal; Notable for the following components:   Color, Urine YELLOW (*)    APPearance CLOUDY (*)  pH 9.0 (*)    Hgb urine dipstick SMALL (*)    Protein, ur 100 (*)    Leukocytes,Ua LARGE (*)    Bacteria, UA RARE (*)    All other components within normal limits  CULTURE, BLOOD (ROUTINE X 2)  LIPASE, BLOOD  BRAIN NATRIURETIC PEPTIDE  LACTIC ACID, PLASMA  LACTIC ACID, PLASMA  CBC  BASIC METABOLIC PANEL  MAGNESIUM  PHOSPHORUS  TROPONIN I (HIGH SENSITIVITY)  TROPONIN I (HIGH SENSITIVITY)     EKG  Interpreted by me at 1930 heart rate 70 QRS 70 QTc 430 Normal sinus rhythm no evidence of acute ischemia or ectopy   RADIOLOGY  CT imaging interpreted by me, notable for left sided nephrostomy.  No other acute findings are noted.  Discussed imaging results with Dr. Bernardo Heater of urology consult who advises reasonable to treat with antibiotic at this time based on clinical assessment, urinalysis, and agrees with cefepime and urology will see the patient in consult tomorrow if admitted  CT ABDOMEN PELVIS WO CONTRAST  Result Date: 03/26/2022 CLINICAL DATA:  Abdominal pain and decreased output from left-sided nephrostomy back EXAM: CT ABDOMEN AND PELVIS  WITHOUT CONTRAST TECHNIQUE: Multidetector CT imaging of the abdomen and pelvis was performed following the standard protocol without IV contrast. RADIATION DOSE REDUCTION: This exam was performed according to the departmental dose-optimization program which includes automated exposure control, adjustment of the mA and/or kV according to patient size and/or use of iterative reconstruction technique. COMPARISON:  03/12/2022 FINDINGS: Lower chest: Minimal scarring is noted in the bases bilaterally. Cardiac blood pool is diffusely decreased in attenuation suspicious for underlying anemia. Hepatobiliary: No focal liver abnormality is seen. No gallstones, gallbladder wall thickening, or biliary dilatation. Pancreas: Unremarkable. No pancreatic ductal dilatation or surrounding inflammatory changes. Spleen: Normal in size without focal abnormality. Adrenals/Urinary Tract: Adrenal glands are within normal limits bilaterally. Right kidney demonstrates a tiny nonobstructing stone stable from the prior exam. Left kidney demonstrates a left-sided nephrostomy catheter in satisfactory position. No obstructive changes are seen. The bladder is decompressed. Stomach/Bowel: Colon shows no obstructive or inflammatory changes. The appendix is not well visualized and may have been surgically removed. No inflammatory changes to suggest appendicitis are seen. Small bowel and stomach are unremarkable. Vascular/Lymphatic: Aortic atherosclerosis. No enlarged abdominal or pelvic lymph nodes. IVC filter is noted in satisfactory position. Reproductive: Uterus and bilateral adnexa are unremarkable. Other: No abdominal wall hernia or abnormality. No abdominopelvic ascites. Mild changes of anasarca are noted. Musculoskeletal: Chronic degenerative changes of the hip joints are noted bilaterally. Degenerative changes of lumbar spine are seen. Mild anterolisthesis of L5 on S1 is seen IMPRESSION: Left nephrostomy catheter in satisfactory position. Tiny  nonobstructing right renal stone. Findings suspicious for underlying anemia. No other focal acute abnormality is noted. Electronically Signed   By: Inez Catalina M.D.   On: 03/26/2022 19:52      PROCEDURES:  Critical Care performed: No  Procedures   MEDICATIONS ORDERED IN ED: Medications  sodium chloride 0.9 % bolus 500 mL (has no administration in time range)  ceFEPIme (MAXIPIME) 2 g in sodium chloride 0.9 % 100 mL IVPB (has no administration in time range)  rivaroxaban (XARELTO) tablet 20 mg (has no administration in time range)  rosuvastatin (CRESTOR) tablet 10 mg (has no administration in time range)  pantoprazole (PROTONIX) EC tablet 40 mg (has no administration in time range)  multivitamin with minerals tablet 1 tablet (has no administration in time range)  mometasone-formoterol (DULERA) 100-5 MCG/ACT inhaler 2 puff (  has no administration in time range)  fluticasone furoate-vilanterol (BREO ELLIPTA) 100-25 MCG/ACT 1 puff (has no administration in time range)  ferrous sulfate tablet 325 mg (has no administration in time range)  feeding supplement (ENSURE ENLIVE / ENSURE PLUS) liquid 237 mL (has no administration in time range)  cyanocobalamin (VITAMIN B12) tablet 1,000 mcg (has no administration in time range)  ascorbic acid (VITAMIN C) tablet 1,000 mg (has no administration in time range)  acetaminophen (TYLENOL) tablet 650 mg (has no administration in time range)  polyethylene glycol (MIRALAX / GLYCOLAX) packet 17 g (has no administration in time range)  ondansetron (ZOFRAN) injection 4 mg (has no administration in time range)  melatonin tablet 5 mg (has no administration in time range)     IMPRESSION / MDM / ASSESSMENT AND PLAN / ED COURSE  I reviewed the triage vital signs and the nursing notes.                              Differential diagnosis includes, but is not limited to, pyelonephritis, urinary tract infection, renal stone, colitis, diverticulitis, and other  causes for acute left flank discomfort but given her history and cloudy urostomy output with associated chills I suspect based on labs and imaging that there is evidence of left renal infection urinary tract infection or pyelonephritis.  She does not show evidence of severe sepsis or septic shock at this time, but does carry a history of frequent urinary infection and fairly severe infections in the past.  She has multiple comorbidities.  We will start broadly on cefepime based on previous culture data.  I have consulted with urology who will see her in consult tomorrow.  Dr. Nevada Crane of the hospitalist service admitting.  Patient fully alert well-oriented understanding agreeable with plan for admission  Patient's presentation is most consistent with acute presentation with potential threat to life or bodily function.  The patient is on the cardiac monitor to evaluate for evidence of arrhythmia and/or significant heart rate changes.  Does NOT meet sepsis criteria by SIRS at present.      FINAL CLINICAL IMPRESSION(S) / ED DIAGNOSES   Final diagnoses:  Urinary tract infection, acute     Rx / DC Orders   ED Discharge Orders     None        Note:  This document was prepared using Dragon voice recognition software and may include unintentional dictation errors.   Delman Kitten, MD 03/26/22 2221

## 2022-03-26 NOTE — ED Provider Triage Note (Signed)
Emergency Medicine Provider Triage Evaluation Note  Courtney Mack , a 66 y.o. female  was evaluated in triage.  Pt complains of abdominal pain, unable to urinate.  Patient has urostomy bag and has not had any urine output today.  Pain is in the left lower quadrant.  Review of Systems  Positive:  Negative:   Physical Exam  There were no vitals taken for this visit. Gen:   Awake, no distress   Resp:  Normal effort  MSK:   Moves extremities without difficulty  Other:  Urostomy bag has cloudy urine, patient is tender left lower quadrant, legs are swollen  Medical Decision Making  Medically screening exam initiated at 7:25 PM.  Appropriate orders placed.  Courtney Mack was informed that the remainder of the evaluation will be completed by another provider, this initial triage assessment does not replace that evaluation, and the importance of remaining in the ED until their evaluation is complete.  Nursing staff instructed to put in CT abdomen/pelvis, also sepsis protocols along with BNP due to patient's congestive heart failure   Versie Starks, PA-C 03/26/22 1926

## 2022-03-26 NOTE — Consult Note (Signed)
PHARMACY -  BRIEF ANTIBIOTIC NOTE   Pharmacy has received consult(s) for Cefepime from an ED provider.  The patient's profile has been reviewed for ht/wt/allergies/indication/available labs.    One time order(s) placed for: Cefepime 2g IV x1  Further antibiotics/pharmacy consults should be ordered by admitting physician if indicated.                       Thank you, Lorna Dibble 03/26/2022  10:12 PM

## 2022-03-26 NOTE — ED Notes (Signed)
BLADDER SCAN= 0ML

## 2022-03-26 NOTE — H&P (Incomplete)
History and Physical  Courtney Mack IFO:[id number] DOB: [birth year] DOA: 03/26/2022  Referring physician: Dr. Jacqualine Code, [place]  PCP: [facility], [place]  Outpatient Specialists: Urology Patient coming from: Home  Chief Complaint: Left flank pain, low urine output.  HPI: Courtney Mack is a 66 y.o. female with medical history significant for DVT/PE on Xarelto, hyperlipidemia, iron deficiency anemia, previous UTI, GAVE, recent admission less than 2 weeks ago for dysfunction of left nephrostomy tube, who presented to [facility] ED with complaints of left flank pain of 2 days duration.  Associated with chills and subjective fevers, and low urine output.  In the ED CT abdomen pelvis showed left nephrostomy catheter in satisfactory position.  UA positive for pyuria.  Prior urine culture positive for Proteus Penneri, with sensitivities for cefepime and Merrem, among others.  Patient was started on cefepime in the ED.  EDP discussed the case with urology who will see in consultation.  N.p.o. after midnight for possible nephrostogram in the morning.  TRH, hospitalist service was asked to admit for complicated UTI.  ED Course: ***  Review of Systems: Review of systems as noted in the HPI. All other systems reviewed and are negative.   Past Medical History:  Diagnosis Date   Acute bilateral deep vein thrombosis (DVT) of femoral veins ([place]) 01/02/2021   Acute massive pulmonary embolism ([place]) 08/25/2020   Last Assessment & Plan:  Formatting of this note might be different from the original. Post vena caval filter and on xarelto and O2   Anemia    ARF (acute respiratory failure) ([place])    Cellulitis of left lower leg 06/20/2021   CHF (congestive heart failure) ([place])    COVID-19    Diabetes mellitus without complication ([place])    DVT (deep venous thrombosis) ([place]) 09/06/2020   Endometrial cancer (H[facility])    GAVE (gastric antral vascular ectasia)    GERD (gastroesophageal reflux disease)     High cholesterol    Hx of blood clots    Hyperlipidemia    Hypertension    IDA (iron deficiency anemia) 09/21/2020   Obesity    Pressure injury of skin 06/21/2021   Pulmonary embolism ([place])    Sepsis ([place])    Thrombocytopenia ([place])    Urinary tract infection 09/13/2021   Past Surgical History:  Procedure Laterality Date   COLONOSCOPY N/A 06/21/2021   Procedure: COLONOSCOPY;  Surgeon: Toledo, Benay Pike, MD;  Location: [facility] ENDOSCOPY;  Service: Gastroenterology;  Laterality: N/A;   CYSTOSCOPY W/ URETERAL STENT PLACEMENT Left 09/11/2021   Procedure: CYSTOSCOPY WITH RETROGRADE PYELOGRAM/URETERAL STENT PLACEMENT;  Surgeon: Abbie Sons, MD;  Location: [facility] ORS;  Service: Urology;  Laterality: Left;   CYSTOSCOPY W/ URETERAL STENT PLACEMENT Left 11/17/2021   Procedure: CYSTOSCOPY WITH STENT REPLACEMENT;  Surgeon: Irine Seal, MD;  Location: [facility] ORS;  Service: Urology;  Laterality: Left;   CYSTOSCOPY WITH FULGERATION  11/17/2021   Procedure: CYSTOSCOPY WITH FULGERATION;  Surgeon: Irine Seal, MD;  Location: [facility] ORS;  Service: Urology;;   CYSTOSCOPY WITH STENT PLACEMENT Left 10/17/2021   Procedure: CYSTOSCOPY WITH STENT PLACEMENT;  Surgeon: Abbie Sons, MD;  Location: [facility] ORS;  Service: Urology;  Laterality: Left;   CYSTOSCOPY WITH URETEROSCOPY, STONE BASKETRY AND STENT PLACEMENT Left 10/16/2021   Procedure: CYSTOSCOPY WITH URETEROSCOPY  AND STENT REMOVAL;  Surgeon: Abbie Sons, MD;  Location: [facility] ORS;  Service: Urology;  Laterality: Left;   ENTEROSCOPY N/A 08/17/2021   Procedure: ENTEROSCOPY;  Surgeon: Lin Landsman, MD;  Location: [facility] ENDOSCOPY;  Service: Gastroenterology;  Laterality: N/A;   ESOPHAGOGASTRODUODENOSCOPY N/A 06/21/2021   Procedure: ESOPHAGOGASTRODUODENOSCOPY (EGD);  Surgeon: Toledo, Benay Pike, MD;  Location: [facility] ENDOSCOPY;  Service: Gastroenterology;  Laterality: N/A;   ESOPHAGOGASTRODUODENOSCOPY (EGD) WITH PROPOFOL N/A 08/17/2021   Procedure:  ESOPHAGOGASTRODUODENOSCOPY (EGD) WITH PROPOFOL;  Surgeon: Lin Landsman, MD;  Location: [facility] ENDOSCOPY;  Service: Gastroenterology;  Laterality: N/A;   ESOPHAGOGASTRODUODENOSCOPY (EGD) WITH PROPOFOL N/A 10/14/2021   Procedure: ESOPHAGOGASTRODUODENOSCOPY (EGD) WITH PROPOFOL;  Surgeon: [name], MD;  Location: [facility] ENDOSCOPY;  Service: Endoscopy;  Laterality: N/A;   ESOPHAGOGASTRODUODENOSCOPY (EGD) WITH PROPOFOL N/A 02/01/2022   Procedure: ESOPHAGOGASTRODUODENOSCOPY (EGD) WITH PROPOFOL;  Surgeon: Lin Landsman, MD;  Location: [facility] ENDOSCOPY;  Service: Gastroenterology;  Laterality: N/A;   ESOPHAGOGASTRODUODENOSCOPY (EGD) WITH PROPOFOL N/A 03/15/2022   Procedure: ESOPHAGOGASTRODUODENOSCOPY (EGD) WITH PROPOFOL;  Surgeon: Jonathon Bellows, MD;  Location: [facility] ENDOSCOPY;  Service: Gastroenterology;  Laterality: N/A;   GIVENS CAPSULE STUDY N/A 06/22/2021   Procedure: GIVENS CAPSULE STUDY;  Surgeon: Toledo, Benay Pike, MD;  Location: [facility] ENDOSCOPY;  Service: Gastroenterology;  Laterality: N/A;   IR NEPHROSTOMY EXCHANGE LEFT  02/25/2022   IR NEPHROSTOMY PLACEMENT LEFT  12/27/2021   IVC FILTER INSERTION N/A 08/18/2020   Procedure: IVC FILTER INSERTION;  Surgeon: Katha Cabal, MD;  Location: [place] CV LAB;  Service: Cardiovascular;  Laterality: N/A;   PERIPHERAL VASCULAR THROMBECTOMY Bilateral 01/03/2021   Procedure: PERIPHERAL VASCULAR THROMBECTOMY;  Surgeon: Algernon Huxley, MD;  Location: [place] CV LAB;  Service: Cardiovascular;  Laterality: Bilateral;   PULMONARY THROMBECTOMY N/A 08/18/2020   Procedure: PULMONARY THROMBECTOMY;  Surgeon: Katha Cabal, MD;  Location: [place] CV LAB;  Service: Cardiovascular;  Laterality: N/A;   TUBAL LIGATION     VISCERAL ANGIOGRAPHY N/A 07/18/2021   Procedure: VISCERAL ANGIOGRAPHY;  Surgeon: Algernon Huxley, MD;  Location: [place] CV LAB;  Service: Cardiovascular;  Laterality: N/A;    Social History:  reports that she has never smoked.  She has never used smokeless tobacco. She reports that she does not currently use alcohol. She reports that she does not currently use drugs.   No Known Allergies  Family History  Problem Relation Age of Onset   Hypertension Mother    Diabetes Mother    Diabetes Father    Hypertension Father    High Cholesterol Father    Congestive Heart Failure Father    Breast cancer Cousin     ***  Prior to Admission medications   Medication Sig Start Date End Date Taking? Authorizing Provider  acetaminophen (TYLENOL) 500 MG tablet Take 500 mg by mouth every 6 (six) hours as needed for mild pain or fever.    [provider]  Ascorbic Acid (VITAMIN C) 1000 MG tablet Take 1,000 mg by mouth daily.    [provider]  cyanocobalamin 1000 MCG tablet Take 1 tablet (1,000 mcg total) by mouth daily. 02/03/22   Fritzi Mandes, MD  feeding supplement (ENSURE ENLIVE / ENSURE PLUS) LIQD Take 237 mLs by mouth 3 (three) times daily between meals. 02/02/22   Fritzi Mandes, MD  ferrous sulfate 325 (65 FE) MG tablet Take 1 tablet (325 mg total) by mouth 2 (two) times daily with a meal. 03/16/22   Lorella Nimrod, MD  fluticasone furoate-vilanterol (BREO ELLIPTA) 100-25 MCG/ACT AEPB Inhale 1 puff into the lungs daily.    [provider]  furosemide (LASIX) 20 MG tablet Hold due to low blood pressure. Patient taking differently: Take 20  mg by mouth daily. 12/31/21   Enzo Bi, MD  loperamide (IMODIUM) 2 MG capsule Take 1 capsule (2 mg total) by mouth as needed for diarrhea or loose stools. 06/26/21   Sreenath, Trula Slade, MD  mometasone-formoterol (DULERA) 100-5 MCG/ACT AERO Inhale 2 puffs into the lungs 2 (two) times daily.    [provider]  Multiple Vitamin (MULTIVITAMIN WITH MINERALS) TABS tablet Take 1 tablet by mouth daily. 09/12/20   [name], MD  pantoprazole (PROTONIX) 40 MG tablet Take 1 tablet (40 mg total) by mouth 2 (two) times daily. 03/01/22 04/30/22  Sidney Ace,  MD  rivaroxaban (XARELTO) 20 MG TABS tablet Take 1 tablet (20 mg total) by mouth daily. Hold until Wednesday. 03/16/22 06/14/22  Lorella Nimrod, MD  rosuvastatin (CRESTOR) 10 MG tablet Take 1 tablet (10 mg total) by mouth daily. Patient taking differently: Take 10 mg by mouth in the morning. 10/03/20   Earlie Server, MD    Physical Exam: BP (!) 99/54   Pulse 88   Temp 98.7 F (37.1 C) (Oral)   Resp 18   SpO2 98%   General: 66 y.o. year-old female well developed well nourished in no acute distress.  Alert and oriented x3. Cardiovascular: Regular rate and rhythm with no rubs or gallops.  No thyromegaly or JVD noted.  No lower extremity edema. 2/4 pulses in all 4 extremities. Respiratory: Clear to auscultation with no wheezes or rales. Good inspiratory effort. Abdomen: Soft nontender nondistended with normal bowel sounds x4 quadrants. Muskuloskeletal: No cyanosis, clubbing or edema noted bilaterally Neuro: CN II-XII intact, strength, sensation, reflexes Skin: No ulcerative lesions noted or rashes Psychiatry: Judgement and insight appear normal. Mood is appropriate for condition and setting          Labs on Admission:  Basic Metabolic Panel: Recent Labs  Lab 03/26/22 2124  NA 134*  K 3.1*  CL 104  CO2 23  GLUCOSE 110*  BUN 28*  CREATININE 1.11*  CALCIUM 8.2*   Liver Function Tests: Recent Labs  Lab 03/26/22 2124  AST 24  ALT 15  ALKPHOS 46  BILITOT 1.4*  PROT 6.6  ALBUMIN 2.9*   Recent Labs  Lab 03/26/22 1942  LIPASE 28   No results for input(s): "AMMONIA" in the last 168 hours. CBC: Recent Labs  Lab 03/26/22 1942  WBC 5.8  HGB 7.3*  HCT 24.5*  MCV 81.1  PLT 265   Cardiac Enzymes: No results for input(s): "CKTOTAL", "CKMB", "CKMBINDEX", "TROPONINI" in the last 168 hours.  BNP (last 3 results) Recent Labs    01/29/22 1930 02/23/22 2327 03/26/22 1942  BNP 66.2 51.9 86.5    ProBNP (last 3 results) No results for input(s): "PROBNP" in the last 8760  hours.  CBG: No results for input(s): "GLUCAP" in the last 168 hours.  Radiological Exams on Admission: CT ABDOMEN PELVIS WO CONTRAST  Result Date: 03/26/2022 CLINICAL DATA:  Abdominal pain and decreased output from left-sided nephrostomy back EXAM: CT ABDOMEN AND PELVIS WITHOUT CONTRAST TECHNIQUE: Multidetector CT imaging of the abdomen and pelvis was performed following the standard protocol without IV contrast. RADIATION DOSE REDUCTION: This exam was performed according to the departmental dose-optimization program which includes automated exposure control, adjustment of the mA and/or kV according to patient size and/or use of iterative reconstruction technique. COMPARISON:  03/12/2022 FINDINGS: Lower chest: Minimal scarring is noted in the bases bilaterally. Cardiac blood pool is diffusely decreased in attenuation suspicious for underlying anemia. Hepatobiliary: No focal liver abnormality  is seen. No gallstones, gallbladder wall thickening, or biliary dilatation. Pancreas: Unremarkable. No pancreatic ductal dilatation or surrounding inflammatory changes. Spleen: Normal in size without focal abnormality. Adrenals/Urinary Tract: Adrenal glands are within normal limits bilaterally. Right kidney demonstrates a tiny nonobstructing stone stable from the prior exam. Left kidney demonstrates a left-sided nephrostomy catheter in satisfactory position. No obstructive changes are seen. The bladder is decompressed. Stomach/Bowel: Colon shows no obstructive or inflammatory changes. The appendix is not well visualized and may have been surgically removed. No inflammatory changes to suggest appendicitis are seen. Small bowel and stomach are unremarkable. Vascular/Lymphatic: Aortic atherosclerosis. No enlarged abdominal or pelvic lymph nodes. IVC filter is noted in satisfactory position. Reproductive: Uterus and bilateral adnexa are unremarkable. Other: No abdominal wall hernia or abnormality. No abdominopelvic ascites.  Mild changes of anasarca are noted. Musculoskeletal: Chronic degenerative changes of the hip joints are noted bilaterally. Degenerative changes of lumbar spine are seen. Mild anterolisthesis of L5 on S1 is seen IMPRESSION: Left nephrostomy catheter in satisfactory position. Tiny nonobstructing right renal stone. Findings suspicious for underlying anemia. No other focal acute abnormality is noted. Electronically Signed   By: Inez Catalina M.D.   On: 03/26/2022 19:52    EKG: I independently viewed the EKG done and my findings are as followed: ***   Assessment/Plan Present on Admission:  Complicated UTI (urinary tract infection)  Principal Problem:   Complicated UTI (urinary tract infection)   DVT prophylaxis: ***   Code Status: ***   Family Communication: ***   Disposition Plan: ***   Consults called: ***   Admission status: ***    Status is: Inpatient {Inpatient:23812}   Kayleen Memos MD [facility] Pager [phone]  If 7PM-7AM, please contact night-coverage [email or web address] Password [facility]  03/26/2022, 10:16 PM

## 2022-03-27 ENCOUNTER — Other Ambulatory Visit: Payer: Self-pay

## 2022-03-27 ENCOUNTER — Other Ambulatory Visit: Payer: Self-pay | Admitting: Urology

## 2022-03-27 ENCOUNTER — Inpatient Hospital Stay: Payer: Medicare HMO | Admitting: Radiology

## 2022-03-27 DIAGNOSIS — N390 Urinary tract infection, site not specified: Secondary | ICD-10-CM | POA: Diagnosis not present

## 2022-03-27 HISTORY — PX: IR NEPHROSTOMY EXCHANGE LEFT: IMG6069

## 2022-03-27 LAB — IRON AND TIBC
Iron: 10 ug/dL — ABNORMAL LOW (ref 28–170)
Saturation Ratios: 4 % — ABNORMAL LOW (ref 10.4–31.8)
TIBC: 249 ug/dL — ABNORMAL LOW (ref 250–450)
UIBC: 239 ug/dL

## 2022-03-27 LAB — CBC
HCT: 19.5 % — ABNORMAL LOW (ref 36.0–46.0)
HCT: 25.9 % — ABNORMAL LOW (ref 36.0–46.0)
Hemoglobin: 5.8 g/dL — ABNORMAL LOW (ref 12.0–15.0)
Hemoglobin: 7.9 g/dL — ABNORMAL LOW (ref 12.0–15.0)
MCH: 24.1 pg — ABNORMAL LOW (ref 26.0–34.0)
MCH: 24.7 pg — ABNORMAL LOW (ref 26.0–34.0)
MCHC: 29.7 g/dL — ABNORMAL LOW (ref 30.0–36.0)
MCHC: 30.5 g/dL (ref 30.0–36.0)
MCV: 80.9 fL (ref 80.0–100.0)
MCV: 80.9 fL (ref 80.0–100.0)
Platelets: 203 10*3/uL (ref 150–400)
Platelets: 232 10*3/uL (ref 150–400)
RBC: 2.41 MIL/uL — ABNORMAL LOW (ref 3.87–5.11)
RBC: 3.2 MIL/uL — ABNORMAL LOW (ref 3.87–5.11)
RDW: 20.2 % — ABNORMAL HIGH (ref 11.5–15.5)
RDW: 18.9 % — ABNORMAL HIGH (ref 11.5–15.5)
WBC: 4.2 10*3/uL (ref 4.0–10.5)
WBC: 4.5 10*3/uL (ref 4.0–10.5)
nRBC: 0 % (ref 0.0–0.2)
nRBC: 0 % (ref 0.0–0.2)

## 2022-03-27 LAB — CBC WITH DIFFERENTIAL/PLATELET
Abs Immature Granulocytes: 0.01 K/uL (ref 0.00–0.07)
Basophils Absolute: 0 K/uL (ref 0.0–0.1)
Basophils Relative: 0 %
Eosinophils Absolute: 0.2 K/uL (ref 0.0–0.5)
Eosinophils Relative: 5 %
HCT: 19.6 % — ABNORMAL LOW (ref 36.0–46.0)
Hemoglobin: 5.8 g/dL — ABNORMAL LOW (ref 12.0–15.0)
Immature Granulocytes: 0 %
Lymphocytes Relative: 10 %
Lymphs Abs: 0.4 K/uL — ABNORMAL LOW (ref 0.7–4.0)
MCH: 24 pg — ABNORMAL LOW (ref 26.0–34.0)
MCHC: 29.6 g/dL — ABNORMAL LOW (ref 30.0–36.0)
MCV: 81 fL (ref 80.0–100.0)
Monocytes Absolute: 0.4 K/uL (ref 0.1–1.0)
Monocytes Relative: 10 %
Neutro Abs: 2.9 K/uL (ref 1.7–7.7)
Neutrophils Relative %: 75 %
Platelets: 211 K/uL (ref 150–400)
RBC: 2.42 MIL/uL — ABNORMAL LOW (ref 3.87–5.11)
RDW: 20.1 % — ABNORMAL HIGH (ref 11.5–15.5)
WBC: 3.9 K/uL — ABNORMAL LOW (ref 4.0–10.5)
nRBC: 0 % (ref 0.0–0.2)

## 2022-03-27 LAB — BASIC METABOLIC PANEL
Anion gap: 7 (ref 5–15)
BUN: 26 mg/dL — ABNORMAL HIGH (ref 8–23)
CO2: 22 mmol/L (ref 22–32)
Calcium: 7.8 mg/dL — ABNORMAL LOW (ref 8.9–10.3)
Chloride: 107 mmol/L (ref 98–111)
Creatinine, Ser: 1 mg/dL (ref 0.44–1.00)
GFR, Estimated: >60 mL/min (ref >60)
Glucose, Bld: 136 mg/dL — ABNORMAL HIGH (ref 70–99)
Potassium: 3.4 mmol/L — ABNORMAL LOW (ref 3.5–5.1)
Sodium: 136 mmol/L (ref 135–145)

## 2022-03-27 LAB — MAGNESIUM: Magnesium: 1.7 mg/dL (ref 1.7–2.4)

## 2022-03-27 LAB — VITAMIN B12: Vitamin B-12: 286 pg/mL (ref 180–914)

## 2022-03-27 LAB — PROTIME-INR
INR: 1.5 — ABNORMAL HIGH (ref 0.8–1.2)
Prothrombin Time: 17.5 seconds — ABNORMAL HIGH (ref 11.4–15.2)

## 2022-03-27 LAB — FERRITIN: Ferritin: 27 ng/mL (ref 11–307)

## 2022-03-27 LAB — FOLATE: Folate: 38 ng/mL (ref >5.9)

## 2022-03-27 LAB — PHOSPHORUS: Phosphorus: 3.5 mg/dL (ref 2.5–4.6)

## 2022-03-27 MED ORDER — POTASSIUM CHLORIDE CRYS ER 20 MEQ PO TBCR
40.0000 meq | EXTENDED_RELEASE_TABLET | Freq: Two times a day (BID) | ORAL | Status: AC
  Administered 2022-03-27 (×2): 40 meq via ORAL
  Filled 2022-03-27 (×2): qty 2

## 2022-03-27 MED ORDER — SODIUM CHLORIDE 0.9% IV SOLUTION: Freq: Once | INTRAVENOUS | Status: DC

## 2022-03-27 MED ORDER — IOHEXOL 300 MG/ML  SOLN
4.0000 mL | Freq: Once | INTRAMUSCULAR | Status: AC | PRN
  Administered 2022-03-27: 4 mL

## 2022-03-27 MED ORDER — SODIUM CHLORIDE 0.9 % IV SOLN
2.0000 g | INTRAVENOUS | Status: DC
  Filled 2022-03-27: qty 12.5

## 2022-03-27 MED ORDER — LIDOCAINE HCL 1 % IJ SOLN
INTRAMUSCULAR | Status: AC
  Administered 2022-03-27: 3 mL
  Filled 2022-03-27: qty 20

## 2022-03-27 MED ORDER — SODIUM CHLORIDE 0.9 % IV SOLN
2.0000 g | Freq: Two times a day (BID) | INTRAVENOUS | Status: DC
  Administered 2022-03-27 – 2022-03-29 (×5): 2 g via INTRAVENOUS
  Filled 2022-03-27: qty 2
  Filled 2022-03-27 (×2): qty 12.5
  Filled 2022-03-27: qty 2

## 2022-03-27 NOTE — ED Notes (Signed)
Pts o2 levels dropping to high 80s while asleep, pt placed on 2L [place] at this time

## 2022-03-27 NOTE — ED Notes (Signed)
Informed Rn bed assigned  

## 2022-03-27 NOTE — ED Notes (Signed)
Pt requesting to take morning medicine after IR scan is complete.

## 2022-03-27 NOTE — Progress Notes (Signed)
Urology  Contacted by ED evening 03/26/2022.  [name] is complaining of decreased urine output left nephrostomy tube for several days and subsequent development of left flank pain x2 days with chills, subjective fevers.  CT showed neph tube in position.  UA collected from the nephrostomy with pyuria.  Patient started on empiric antibiotics. Nephrostomy tube was last changed 02/25/2022 and she is scheduled for a change 04/22/2022.  Recommendation: Nephrostogram-order entered

## 2022-03-27 NOTE — ED Notes (Signed)
Pt denies needs at this time.  

## 2022-03-27 NOTE — Progress Notes (Signed)
PHARMACY NOTE:  ANTIMICROBIAL RENAL DOSAGE ADJUSTMENT  Current antimicrobial regimen includes a mismatch between antimicrobial dosage and estimated renal function.  As per policy approved by the Pharmacy & Therapeutics and Medical Executive Committees, the antimicrobial dosage will be adjusted accordingly.  Current antimicrobial dosage:  cefepime 2 grams every 24 hours  Indication: complicated UTI  Renal Function:  Estimated Creatinine Clearance: 51.5 mL/min (by C-G formula based on SCr of 1 mg/dL). '[]'$      On intermittent HD, scheduled: '[]'$      On CRRT  CrCl > 50 mL/min on cefepime for UTI without sepsis    Antimicrobial dosage has been changed to: cefepime 2 grams every 12 hours  Additional comments:  Thank you for allowing pharmacy to be a part of this patient's care.   Glean Salvo, PharmD Clinical Pharmacist  03/27/2022 10:48 AM

## 2022-03-27 NOTE — Plan of Care (Signed)
  Problem: Education: Goal: Knowledge of General Education information will improve Description: Including pain rating scale, medication(s)/side effects and non-pharmacologic comfort measures Outcome: Progressing   Problem: Coping: Goal: Level of anxiety will decrease Outcome: Progressing   

## 2022-03-27 NOTE — ED Notes (Signed)
Pt given meal tray at this time 

## 2022-03-27 NOTE — Progress Notes (Signed)
  Progress Note Patient: Courtney Mack VPX:[id number] DOB: [birth year] DOA: 03/26/2022  DOS: the patient was seen and examined on 03/27/2022  Brief hospital course: PMH of DVT PE on Xarelto, HLD, IDA, GERD, left PCN presents with fatigue and left flank pain.  Also found to have symptomatic anemia with hemoglobin of 5.8.  SP 2 PRBC transfusion.  Urology following. Assessment and Plan: Bilateral hydronephrosis SP left PCN placement. Complicated UTI. Underwent nephrogram.  Urology following. Currently receiving IV cefepime. Follow-up on cultures. Continue with IV fluids.  Iron deficiency anemia, acute on chronic. Acute hemoglobin drop most likely secondary to dilutional. Hemoglobin 5.8.  Will order 2 PRBC transfusion. Monitor. Continue iron supplementation.  Hypokalemia. Replacing.  Acute kidney injury. Currently receiving IV fluid with improvement. Monitor.  Elevated troponin. Likely demand ischemia. No evidence of chest pain. Monitor.  History of DVT PE. On Xarelto. Currently holding due to concerns with anemia. We will likely resume heparin tomorrow.  GERD. Continue PPI.  Subjective: Reports fatigue and tiredness.  No nausea no vomiting no fever no chills.  No bleeding reported.  Physical Exam: Vitals:   03/27/22 1155 03/27/22 1325 03/27/22 1352 03/27/22 1610  BP: (!) 88/44 (!) 92/53 (!) 92/52 (!) 104/56  Pulse: 65 62 64 68  Resp: '17 17 18 19  '$ Temp: 98.6 F (37 C) 98.7 F (37.1 C) 98.6 F (37 C) 98.5 F (36.9 C)  TempSrc: Oral Oral Oral   SpO2: 100% 100% 100% 100%   General: Appear in mild distress; no visible Abnormal Neck Mass Or lumps, Conjunctiva normal Cardiovascular: S1 and S2 Present, no Murmur, Respiratory: good respiratory effort, Bilateral Air entry present and CTA, no Crackles, no wheezes Abdomen: Bowel Sound present, Non tender  Extremities: no Pedal edema Neurology: alert and oriented to time, place, and person  Gait not checked due to  patient safety concerns   Data Reviewed: I have Reviewed nursing notes, Vitals, and Lab results since pt's last encounter. Pertinent lab results CBC and BMP I have ordered test including CBC and BMP    Family Communication: No one at bedside  Disposition: Status is: Inpatient Remains inpatient appropriate because: Needing IV antibiotics follow-up on cultures.  Needs stable hemoglobin.  Author: Berle Mull, MD 03/27/2022 5:13 PM  Please look on [email or web address] to find out who is on call.

## 2022-03-27 NOTE — Hospital Course (Addendum)
PMH of DVT PE on Xarelto, HLD, IDA, GERD, left PCN presents with fatigue and left flank pain.  Also found to have symptomatic anemia with hemoglobin of 5.8.  S/P 2 PRBC transfusion.  Urology was consulted.  No bleeding seen on therapeutic IV heparin and patient was transitioned to oral antibiotics and oral anticoagulation and discharged home

## 2022-03-28 ENCOUNTER — Telehealth: Payer: Self-pay | Admitting: Oncology

## 2022-03-28 ENCOUNTER — Telehealth: Payer: Self-pay | Admitting: *Deleted

## 2022-03-28 DIAGNOSIS — N390 Urinary tract infection, site not specified: Secondary | ICD-10-CM | POA: Diagnosis not present

## 2022-03-28 LAB — CBC
HCT: 24.4 % — ABNORMAL LOW (ref 36.0–46.0)
Hemoglobin: 7.5 g/dL — ABNORMAL LOW (ref 12.0–15.0)
MCH: 25 pg — ABNORMAL LOW (ref 26.0–34.0)
MCHC: 30.7 g/dL (ref 30.0–36.0)
MCV: 81.3 fL (ref 80.0–100.0)
Platelets: 215 10*3/uL (ref 150–400)
RBC: 3 MIL/uL — ABNORMAL LOW (ref 3.87–5.11)
RDW: 19.1 % — ABNORMAL HIGH (ref 11.5–15.5)
WBC: 3.7 10*3/uL — ABNORMAL LOW (ref 4.0–10.5)
nRBC: 0 % (ref 0.0–0.2)

## 2022-03-28 LAB — URINE CULTURE: Culture: >=100000 — AB

## 2022-03-28 LAB — RENAL FUNCTION PANEL
Albumin: 2.3 g/dL — ABNORMAL LOW (ref 3.5–5.0)
Anion gap: 6 (ref 5–15)
BUN: 20 mg/dL (ref 8–23)
CO2: 22 mmol/L (ref 22–32)
Calcium: 8 mg/dL — ABNORMAL LOW (ref 8.9–10.3)
Chloride: 110 mmol/L (ref 98–111)
Creatinine, Ser: 0.86 mg/dL (ref 0.44–1.00)
GFR, Estimated: >60 mL/min (ref >60)
Glucose, Bld: 97 mg/dL (ref 70–99)
Phosphorus: 3.3 mg/dL (ref 2.5–4.6)
Potassium: 4.2 mmol/L (ref 3.5–5.1)
Sodium: 138 mmol/L (ref 135–145)

## 2022-03-28 LAB — MAGNESIUM: Magnesium: 2 mg/dL (ref 1.7–2.4)

## 2022-03-28 LAB — HEPARIN LEVEL (UNFRACTIONATED): Heparin Unfractionated: <0.1 IU/mL — ABNORMAL LOW (ref 0.30–0.70)

## 2022-03-28 LAB — APTT: aPTT: 30 seconds (ref 24–36)

## 2022-03-28 MED ORDER — CYANOCOBALAMIN 1000 MCG/ML IJ SOLN
1000.0000 ug | Freq: Once | INTRAMUSCULAR | Status: AC
  Administered 2022-03-28: 1000 ug via SUBCUTANEOUS
  Filled 2022-03-28: qty 1

## 2022-03-28 MED ORDER — HEPARIN (PORCINE) 25000 UT/250ML-% IV SOLN
1400.0000 [IU]/h | INTRAVENOUS | Status: DC
  Administered 2022-03-28: 1100 [IU]/h via INTRAVENOUS
  Filled 2022-03-28: qty 250

## 2022-03-28 MED ORDER — FERROUS SULFATE 325 (65 FE) MG PO TABS: 325.0000 mg | ORAL_TABLET | Freq: Every day | ORAL | Status: DC

## 2022-03-28 MED ORDER — FERROUS SULFATE 325 (65 FE) MG PO TABS
325.0000 mg | ORAL_TABLET | Freq: Three times a day (TID) | ORAL | Status: DC
  Administered 2022-03-28 – 2022-03-29 (×5): 325 mg via ORAL
  Filled 2022-03-28 (×5): qty 1

## 2022-03-28 NOTE — Evaluation (Signed)
Occupational Therapy Evaluation Patient Details Name: Courtney Mack MRN: [id number] DOB: [birth year] Today's Date: 03/28/2022   History of Present Illness 66 y.o. year old female with PMH recurrent septic shock associated with left hydronephrosis without obstructing stone managed with left nephrostomy tube most recently exchanged by IR on  02/25/2022, DVT on Xarelto, GI bleed, endometrial cancer, heart failure, iron deficiency anemia, and recurrent UTIs who presented to the ED overnight with reports of weakness and nondraining left nephrostomy tube.   Clinical Impression   Courtney Mack was seen for OT evaluation this date. Prior to hospital admission, pt was modified independent for functional mobility using a RW and required assistance from her family members to perform LB ADL management 2/2 significant baseline BLE edema. Pt lives with her daughter and grandchildren with family available to provide 24/7 assist. Pt presents to acute OT at or near baseline level of functional independence. She endorses feeling weaker than usual in her legs, but otherwise feels able to perform ADL management with assistance from her family upon DC. She requires MIN GUARD for STS/functional mobility. She denies additional strength, sensory, or visual deficits at this time. No cognitive or safety deficits appreciated. Pt educated on falls prevention strategies for home and hospital as well as compression stocking management strategies for BLE edema. No further acute OT needs identified. Will sign off at this time. Please re-consult if additional OT needs arise during this hospital stay.        Recommendations for follow up therapy are one component of a multi-disciplinary discharge planning process, led by the attending physician.  Recommendations may be updated based on patient status, additional functional criteria and insurance authorization.   Follow Up Recommendations  No OT follow up    Assistance Recommended  at Discharge Intermittent Supervision/Assistance  Patient can return home with the following      Functional Status Assessment  Patient has had a recent decline in their functional status and demonstrates the ability to make significant improvements in function in a reasonable and predictable amount of time.  Equipment Recommendations  None recommended by OT (Pt has necessary equipment.)    Recommendations for Other Services       Precautions / Restrictions Precautions Precautions: Fall Restrictions Weight Bearing Restrictions: No      Mobility Bed Mobility Overal bed mobility: Needs Assistance Bed Mobility: Supine to Sit     Supine to sit: Min assist          Transfers Overall transfer level: Needs assistance Equipment used: Rolling walker (2 wheels) Transfers: Sit to/from Stand Sit to Stand: Min guard                  Balance Overall balance assessment: Modified Independent Sitting-balance support: No upper extremity supported Sitting balance-Leahy Scale: Good     Standing balance support: Bilateral upper extremity supported Standing balance-Leahy Scale: Fair                             ADL either performed or assessed with clinical judgement   ADL Overall ADL's : At baseline                                       General ADL Comments: Pt presents at or near functional baseline. She requires assistance from family members at home for LB ADL Management consistently. She is  able to perform UB ADL management at baseline level of independence.     Vision Baseline Vision/History: 0 No visual deficits Patient Visual Report: No change from baseline       Perception     Praxis      Pertinent Vitals/Pain Pain Assessment Pain Assessment: 0-10 Pain Score: 3  Pain Location: L flank pain, BLE Pain Descriptors / Indicators: Discomfort, Sore Pain Intervention(s): Limited activity within patient's tolerance, Monitored during  session     Hand Dominance Right   Extremity/Trunk Assessment Upper Extremity Assessment Upper Extremity Assessment: Generalized weakness (Bue grossly 3/5 t/o. No focal weakness appreciated.)   Lower Extremity Assessment Lower Extremity Assessment: Generalized weakness (BLE notably swollen, edematous, per pt this is baseline for last ~1 year.)       Communication Communication Communication: No difficulties   Cognition Arousal/Alertness: Awake/alert Behavior During Therapy: WFL for tasks assessed/performed Overall Cognitive Status: Within Functional Limits for tasks assessed                                 General Comments: Pleasant, conversational, A&O x3     General Comments       Exercises Other Exercises Other Exercises: Pt educated on falls prevention strategies for home and hospital and compression stocking management strategies. She return verbalizes understanding of education provided.   Shoulder Instructions      Home Living Family/patient expects to be discharged to:: Private residence Living Arrangements: Children Available Help at Discharge: Family;Available 24 hours/day Type of Home: House Home Access: Ramped entrance     Home Layout: One level     Bathroom Shower/Tub: Occupational psychologist: Handicapped height Bathroom Accessibility: Yes   Home Equipment: Rollator (4 wheels);BSC/3in1;Other (comment);Cane - single point;Adaptive equipment;Wheelchair - manual;Hand held Architectural technologist: Reacher;Long-handled sponge Additional Comments: sleeps in recliner      Prior Functioning/Environment Prior Level of Function : Independent/Modified Independent             Mobility Comments: amb household distances with rollator ADLs Comments: pt daugther assists with LB dressing, bathing; Pt able to perform UB dressing, grooming with MOD I. Pt denies falls history in the last 6 months.        OT Problem List:  Decreased strength;Decreased range of motion;Decreased safety awareness;Increased edema;Decreased knowledge of use of DME or AE      OT Treatment/Interventions:      OT Goals(Current goals can be found in the care plan section) Acute Rehab OT Goals Patient Stated Goal: To get stronger OT Goal Formulation: All assessment and education complete, DC therapy Time For Goal Achievement: 03/28/22 Potential to Achieve Goals: Good  OT Frequency:      Co-evaluation              AM-PAC OT "6 Clicks" Daily Activity     Outcome Measure Help from another person eating meals?: None Help from another person taking care of personal grooming?: A Little Help from another person toileting, which includes using toliet, bedpan, or urinal?: A Little Help from another person bathing (including washing, rinsing, drying)?: A Little Help from another person to put on and taking off regular upper body clothing?: A Little Help from another person to put on and taking off regular lower body clothing?: A Little 6 Click Score: 19   End of Session Equipment Utilized During Treatment: Gait belt;Rolling walker (2 wheels) Nurse Communication: Mobility status  Activity  Tolerance: Patient tolerated treatment well Patient left: in chair;with call bell/phone within reach;with chair alarm set  OT Visit Diagnosis: Other abnormalities of gait and mobility (R26.89)                Time: 2119-4174 OT Time Calculation (min): 20 min Charges:  OT General Charges $OT Visit: 1 Visit OT Evaluation $OT Eval Moderate Complexity: 1 Mod OT Treatments $Self Care/Home Management : 8-22 mins  Shara Blazing, M.S., OTR/L Ascom: [phone] 03/28/22, 10:16 AM

## 2022-03-28 NOTE — Progress Notes (Signed)
Nutrition Brief Note  Patient identified on the Malnutrition Screening Tool (MST) Report  Wt Readings from Last 15 Encounters:  03/27/22 86 kg  03/14/22 84.7 kg  02/23/22 79.4 kg  02/05/22 78.9 kg  01/29/22 81.6 kg  01/03/22 81.6 kg  12/24/21 81.6 kg  12/14/21 84.4 kg  11/29/21 78.5 kg  11/26/21 82.7 kg  11/19/21 84.6 kg  11/14/21 78.5 kg  10/23/21 94.3 kg  10/16/21 86.6 kg  10/12/21 80.7 kg   Pt with medical history significant for DVT/PE on Xarelto, hyperlipidemia, iron deficiency anemia, GERD/GAVE, previous UTI, recent admission less than 2 weeks ago for left nephrostomy tube dysfunction, who presented to [facility] ED from home with complaints of left flank pain of 2 days duration.   Pt admitted with complicated UTI.   Reviewed I/O's: +752 ml x 24 hours and +452 ml sicne admission  UOP: 1.8 L x 24 hours  Pt sleeping soundly at time of visit. Noted pt consumed 100% of breakfast.   Wt stable over the past 3 months.  Nutrition-Focused physical exam completed. Findings are no fat depletion, no muscle depletion, and moderate edema.    Medications reviewed and include vitamin B-12, ferrous sulfate, and 0.9% sodium chloride infusion @ 75 ml/hr.   Labs reviewed: CBGS: 110.   Body mass index is 38.29 kg/m. Patient meets criteria for obesity, class II based on current BMI. Obesity is a complex, chronic medical condition that is optimally managed by a multidisciplinary care team. Weight loss is not an ideal goal for an acute inpatient hospitalization. However, if further work-up for obesity is warranted, consider outpatient referral to outpatient bariatric service and/or [facility].    Current diet order is soft, patient is consuming approximately n/a% of meals at this time. Labs and medications reviewed.   No nutrition interventions warranted at this time. If nutrition issues arise, please consult RD.   Loistine Chance, RD, LDN,  [place] Registered Dietitian II Certified Diabetes Care and Education Specialist Please refer to [facility] for RD and/or RD on-call/weekend/after hours pager

## 2022-03-28 NOTE — Evaluation (Signed)
Physical Therapy Evaluation Patient Details Name: Courtney Mack MRN: [id number] DOB: Nov 15, 1955 Today's Date: 03/28/2022  History of Present Illness  66 y.o. year old female with PMH recurrent septic shock associated with left hydronephrosis without obstructing stone managed with left nephrostomy tube most recently exchanged by IR on  02/25/2022, DVT on Xarelto, GI bleed, endometrial cancer, heart failure, iron deficiency anemia, and recurrent UTIs who presented to the ED overnight with reports of weakness and nondraining left nephrostomy tube.  Clinical Impression  Pt endorses feeling more weak than she typically is when she comes in for similar situations.  Never-the-less she was able to participate with bed mobility and walk into the hallway, but with slow, effortful and walker reliant gait. Pt traditionally does not feel that she needs HHPT at d/c but agrees with this PT that she is weaker than normal and would benefit from [place] services.      Recommendations for follow up therapy are one component of a multi-disciplinary discharge planning process, led by the attending physician.  Recommendations may be updated based on patient status, additional functional criteria and insurance authorization.  Follow Up Recommendations Home health PT      Assistance Recommended at Discharge Intermittent Supervision/Assistance  Patient can return home with the following  A little help with bathing/dressing/bathroom;Assist for transportation;Assistance with cooking/housework    Equipment Recommendations None recommended by PT  Recommendations for Other Services       Functional Status Assessment Patient has had a recent decline in their functional status and demonstrates the ability to make significant improvements in function in a reasonable and predictable amount of time.     Precautions / Restrictions Precautions Precautions: Fall Restrictions Weight Bearing Restrictions: No       Mobility  Bed Mobility Overal bed mobility: Modified Independent Bed Mobility: Supine to Sit     Supine to sit: Min assist     General bed mobility comments: HHA, pt able to use PT's arm to pull up to sitting - slow and labored with getting to EOB but able to do so w/o heavy assist    Transfers Overall transfer level: Modified independent Equipment used: Rolling walker (2 wheels) Transfers: Sit to/from Stand Sit to Stand: Min guard                Ambulation/Gait Ambulation/Gait assistance: Supervision Gait Distance (Feet): 35 Feet Assistive device: Rolling walker (2 wheels)         General Gait Details: Slow and labored ambulation, reliant on the walker with increasing forward lean.  Pt's O2 remains in the mid 90s on room air but she endorses significant fatigue with the effort.  Stairs            Wheelchair Mobility    Modified Rankin (Stroke Patients Only)       Balance Overall balance assessment: Modified Independent Sitting-balance support: No upper extremity supported Sitting balance-Leahy Scale: Good     Standing balance support: Bilateral upper extremity supported Standing balance-Leahy Scale: Fair Standing balance comment: Forward lean on walker but no LOB                             Pertinent Vitals/Pain Pain Assessment Pain Assessment: 0-10 Pain Score: 6  Pain Location: L flank    Home Living Family/patient expects to be discharged to:: Private residence Living Arrangements: Children Available Help at Discharge: Family;Available 24 hours/day Type of Home: House Home Access: Ramped entrance  Home Layout: One level Home Equipment: Rollator (4 wheels);BSC/3in1;Other (comment);Cane - single point;Adaptive equipment;Wheelchair - manual;Hand held shower head Additional Comments: sleeps in recliner    Prior Function Prior Level of Function : Independent/Modified Independent             Mobility Comments: amb  household distances with rollator ADLs Comments: pt daugther assists with LB dressing, bathing; Pt able to perform UB dressing, grooming with MOD I. Pt denies falls history in the last 6 months.     Hand Dominance   Dominant Hand: Right    Extremity/Trunk Assessment   Upper Extremity Assessment Upper Extremity Assessment: Generalized weakness (Bue grossly 3/5 t/o. No focal weakness appreciated.)    Lower Extremity Assessment Lower Extremity Assessment: Generalized weakness (BLE notably swollen, edematous, per pt this is baseline for last ~1 year.)       Communication   Communication: No difficulties  Cognition Arousal/Alertness: Awake/alert Behavior During Therapy: WFL for tasks assessed/performed Overall Cognitive Status: Within Functional Limits for tasks assessed                                          General Comments      Exercises     Assessment/Plan    PT Assessment Patient needs continued PT services  PT Problem List Decreased range of motion;Decreased strength;Decreased activity tolerance;Decreased balance;Decreased mobility;Decreased knowledge of use of DME;Decreased safety awareness;Pain       PT Treatment Interventions DME instruction;Gait training;Functional mobility training;Therapeutic activities;Therapeutic exercise;Balance training;Patient/family education    PT Goals (Current goals can be found in the Care Plan section)  Acute Rehab PT Goals Patient Stated Goal: Go home PT Goal Formulation: With patient Time For Goal Achievement: 03/30/22 Potential to Achieve Goals: Good    Frequency Min 2X/week     Co-evaluation               AM-PAC PT "6 Clicks" Mobility  Outcome Measure Help needed turning from your back to your side while in a flat bed without using bedrails?: A Little Help needed moving from lying on your back to sitting on the side of a flat bed without using bedrails?: A Little Help needed moving to and from a  bed to a chair (including a wheelchair)?: None Help needed standing up from a chair using your arms (e.g., wheelchair or bedside chair)?: None Help needed to walk in hospital room?: A Little Help needed climbing 3-5 steps with a railing? : A Little 6 Click Score: 20    End of Session Equipment Utilized During Treatment: Gait belt Activity Tolerance: Patient tolerated treatment well Patient left: in chair;with call bell/phone within reach;with chair alarm set   PT Visit Diagnosis: Muscle weakness (generalized) (M62.81);Difficulty in walking, not elsewhere classified (R26.2);Unsteadiness on feet (R26.81)    Time: 5361-4431 PT Time Calculation (min) (ACUTE ONLY): 22 min   Charges:   PT Evaluation $PT Eval Low Complexity: 1 Low PT Treatments $Gait Training: 8-22 mins        Kreg Shropshire, DPT 03/28/2022, 10:04 AM

## 2022-03-28 NOTE — Consult Note (Signed)
[facility] Nurse Consult Note: Reason for Consult: LE wounds Patient is followed by vascular outpatient and was last seen 8.4.23.  At that time Juxta light compression wraps were ordered and consideration for lymphedema pumps was noted.  Patient reports that her vascular doctor ordered some pumps but they have not come yet. She is not currently using compression wraps or pumps Wound type: partial thickness skin ulcers left and right LE, lateral on each leg, none on the posterior calves related to lymphedema and venous stasis. She has skin thickening  of the bilateral malleolar areas  Pressure Injury POA: NA Measurement:scattered ulcerations each less than 1cm in diameter Wound bed: all ulcers are partial thickness; pink, do not appear acutely infected  Drainage (amount, consistency, odor) scant Periwound:intact  Dressing procedure/placement/frequency: Single layer of xeroform over the wounds, top with foam  Needs compression, she is scheduled for follow up with Dr. Lucky Cowboy 08/09/22, may be valuable to have vascular evaluate her while she is inpatient.    Re consult if needed, will not follow at this time. Thanks  Elba Schaber [employer], RN,CWOCN, CNS, [place] [phone])

## 2022-03-28 NOTE — Progress Notes (Signed)
  Progress Note Patient: Courtney Mack:[id number] DOB: [birth year] DOA: 03/26/2022  DOS: the patient was seen and examined on 03/28/2022  Brief hospital course: PMH of DVT PE on Xarelto, HLD, IDA, GERD, left PCN presents with fatigue and left flank pain.  Also found to have symptomatic anemia with hemoglobin of 5.8.  SP 2 PRBC transfusion.  Urology following.  Will initiate IV heparin and monitor hemoglobin. Assessment and Plan: Bilateral hydronephrosis S/P left PCN placement. Complicated UTI. Underwent nephrogram.  Urology following. Currently receiving IV cefepime. Blood cultures are negative.  Urine culture is growing significant oddities of multiple species. Discontinue IV fluids.   Iron deficiency anemia, acute on chronic. Acute hemoglobin drop most likely secondary to dilutional. Hemoglobin dropped down to 5.8. No active bleeding seen outside. SP 2 PRBC transfusion with appropriate elevation in hemoglobin. We will initiate IV heparin and monitor. Continue iron supplementation.  Acute kidney injury. Treated with IV fluids.  Baseline serum creatinine 0.8.  Currently creatinine 1.1.   History of DVT PE. On Xarelto. Currently holding due to concerns with anemia.   GERD.  GAVE Continue PPI. EGD performed on 03/15/2022 Gastric antral vascular ectasia without bleeding. Treated with argon plasma coagulation (APC).  If bleeding reoccurs recommendation is to consider cryotherapy for recurrent bleeding from GAVE.  Chronic lower extremity lymphedema and wounds. Continue current therapy. Outpatient follow-up with vascular surgery recommended.  HLD. Continue statin.  Hypokalemia. Resolved.  Elevated troponin.  No evidence of elevated troponin.  Subjective: No nausea no vomiting no fever no chills.  Reports some left-sided flank pain.  Reports some leg pain which is chronic as well.  Physical Exam: Vitals:   03/28/22 0539 03/28/22 0823 03/28/22 1131 03/28/22 1634  BP: (!)  91/49 99/67 (!) 98/48 (!) 103/51  Pulse: 60 68 71 70  Resp: '16 20 20 20  '$ Temp: (!) 97.5 F (36.4 C) 98.5 F (36.9 C) 97.9 F (36.6 C) 98.6 F (37 C)  TempSrc: Oral     SpO2: 99% 99% 94% 95%  Weight:      Height:       General: Appear in mild distress; no visible Abnormal Neck Mass Or lumps, Conjunctiva normal Cardiovascular: S1 and S2 Present, no Murmur, Respiratory: good respiratory effort, Bilateral Air entry present and CTA, no Crackles, no wheezes Abdomen: Bowel Sound present, left mild tenderness Extremities: bilateral Pedal edema Neurology: alert and oriented to time, place, and person  Gait not checked due to patient safety concerns   Data Reviewed: I have Reviewed nursing notes, Vitals, and Lab results since pt's last encounter. Pertinent lab results CBC and BMP I have ordered test including CBC and BMP    Family Communication: No one at bedside.  Disposition: Status is: Inpatient Remains inpatient appropriate because: Need to ensure hemoglobin remaining stable on therapeutic anticoagulation.  Author: Berle Mull, MD 03/28/2022 6:18 PM  Please look on [email or web address] to find out who is on call.

## 2022-03-28 NOTE — Telephone Encounter (Addendum)
Appts have been cancelled per pt request through another ph call.   Please advise on r/s.

## 2022-03-28 NOTE — Progress Notes (Addendum)
[facility] for IV heparin Indication: Hx of DVT on Xarelto  No Known Allergies  Patient Measurements: Height: '4\' 11"'$  (149.9 cm) Weight: 86 kg (189 lb 9.5 oz) IBW/kg (Calculated) : 43.2 Heparin Dosing Weight: 63.6 kg  Vital Signs: Temp: 97.9 F (36.6 C) (09/21 1131) Temp Source: Oral (09/21 0539) BP: 98/48 (09/21 1131) Pulse Rate: 71 (09/21 1131)  Labs: Recent Labs    03/26/22 1942 03/26/22 2124 03/27/22 0430 03/27/22 0839 03/27/22 1757 03/28/22 0300  HGB 7.3*  --  5.8* 5.8* 7.9* 7.5*  HCT 24.5*  --  19.5* 19.6* 25.9* 24.4*  PLT 265  --  203 211 232 215  LABPROT  --   --   --  17.5*  --   --   INR  --   --   --  1.5*  --   --   CREATININE  --  1.11* 1.00  --   --  0.86  TROPONINIHS 9 8  --   --   --   --     Estimated Creatinine Clearance: 60.4 mL/min (by C-G formula based on SCr of 0.86 mg/dL).   Medications:  On Xarelto PTA.  Assessment: 66 year old female with history of DVT 02/2021 (recent acute on chronic in 02/2022), now here with UTI s/p nephrostomy tube exchange. History of GIB and persistent anemia.   Hgb is 7.5 (however recent baseline seems to be around 7-8). Baseline HL < 0.1, therefore will proceed with dosing via heparin levels.  Goal of Therapy:  Heparin level 0.3-0.7 units/ml  Monitor platelets by anticoagulation protocol: Yes   Plan:  Start heparin infusion at 1,100 units/hr which is previously therapeutic rate HL in 6 hours after initiation Continue to monitor H&H and platelets. Monitor for s/sx bleeding closely. F/u with RN at each dose adjustment.    Glean Salvo, PharmD Clinical Pharmacist  03/28/2022 3:35 PM

## 2022-03-28 NOTE — Telephone Encounter (Signed)
Patient called reporting that she is back in the hospital

## 2022-03-28 NOTE — Telephone Encounter (Signed)
Pt left VM stating she was back in the hospital and to cancel her appts for tomorrow. She would like a call back to discuss rescheduling her appts.

## 2022-03-29 ENCOUNTER — Inpatient Hospital Stay: Payer: Medicaid Other

## 2022-03-29 DIAGNOSIS — N390 Urinary tract infection, site not specified: Secondary | ICD-10-CM | POA: Diagnosis not present

## 2022-03-29 LAB — CBC
HCT: 24.8 % — ABNORMAL LOW (ref 36.0–46.0)
Hemoglobin: 7.7 g/dL — ABNORMAL LOW (ref 12.0–15.0)
MCH: 25.2 pg — ABNORMAL LOW (ref 26.0–34.0)
MCHC: 31 g/dL (ref 30.0–36.0)
MCV: 81.3 fL (ref 80.0–100.0)
Platelets: 214 10*3/uL (ref 150–400)
RBC: 3.05 MIL/uL — ABNORMAL LOW (ref 3.87–5.11)
RDW: 19.2 % — ABNORMAL HIGH (ref 11.5–15.5)
WBC: 4.2 10*3/uL (ref 4.0–10.5)
nRBC: 0 % (ref 0.0–0.2)

## 2022-03-29 LAB — RENAL FUNCTION PANEL
Albumin: 2.1 g/dL — ABNORMAL LOW (ref 3.5–5.0)
Anion gap: 3 — ABNORMAL LOW (ref 5–15)
BUN: 24 mg/dL — ABNORMAL HIGH (ref 8–23)
CO2: 23 mmol/L (ref 22–32)
Calcium: 8.2 mg/dL — ABNORMAL LOW (ref 8.9–10.3)
Chloride: 114 mmol/L — ABNORMAL HIGH (ref 98–111)
Creatinine, Ser: 0.93 mg/dL (ref 0.44–1.00)
GFR, Estimated: >60 mL/min (ref >60)
Glucose, Bld: 137 mg/dL — ABNORMAL HIGH (ref 70–99)
Phosphorus: 3.2 mg/dL (ref 2.5–4.6)
Potassium: 3.8 mmol/L (ref 3.5–5.1)
Sodium: 140 mmol/L (ref 135–145)

## 2022-03-29 LAB — HEPARIN LEVEL (UNFRACTIONATED)
Heparin Unfractionated: <0.1 IU/mL — ABNORMAL LOW (ref 0.30–0.70)
Heparin Unfractionated: 0.26 IU/mL — ABNORMAL LOW (ref 0.30–0.70)

## 2022-03-29 LAB — MAGNESIUM: Magnesium: 1.8 mg/dL (ref 1.7–2.4)

## 2022-03-29 MED ORDER — SULFAMETHOXAZOLE-TRIMETHOPRIM 800-160 MG PO TABS: 1.0000 | ORAL_TABLET | Freq: Two times a day (BID) | ORAL | 0 refills | Status: AC

## 2022-03-29 MED ORDER — HEPARIN BOLUS VIA INFUSION
1900.0000 [IU] | Freq: Once | INTRAVENOUS | Status: AC
  Administered 2022-03-29: 1900 [IU] via INTRAVENOUS
  Filled 2022-03-29: qty 1900

## 2022-03-29 MED ORDER — POLYETHYLENE GLYCOL 3350 17 G PO PACK: 17.0000 g | PACK | Freq: Every day | ORAL | 0 refills | Status: DC | PRN

## 2022-03-29 MED ORDER — RIVAROXABAN 20 MG PO TABS
20.0000 mg | ORAL_TABLET | Freq: Every day | ORAL | Status: DC
  Administered 2022-03-29: 20 mg via ORAL
  Filled 2022-03-29: qty 1

## 2022-03-29 MED ORDER — SULFAMETHOXAZOLE-TRIMETHOPRIM 800-160 MG PO TABS
1.0000 | ORAL_TABLET | Freq: Two times a day (BID) | ORAL | Status: DC
  Administered 2022-03-29: 1 via ORAL
  Filled 2022-03-29: qty 1

## 2022-03-29 MED ORDER — RIVAROXABAN 20 MG PO TABS: 20.0000 mg | ORAL_TABLET | Freq: Every day | ORAL | 2 refills | Status: DC

## 2022-03-29 MED ORDER — HEPARIN BOLUS VIA INFUSION
1000.0000 [IU] | Freq: Once | INTRAVENOUS | Status: AC
  Administered 2022-03-29: 1000 [IU] via INTRAVENOUS
  Filled 2022-03-29: qty 1000

## 2022-03-29 NOTE — Progress Notes (Signed)
[place] for IV heparin t Indication: Hx of DVT on Xarelto  No Known Allergies  Patient Measurements: Height: '4\' 11"'$  (149.9 cm) Weight: 86 kg (189 lb 9.5 oz) IBW/kg (Calculated) : 43.2 Heparin Dosing Weight: 63.6 kg  Vital Signs: Temp: 97.9 F (36.6 C) (09/22 0821) Temp Source: Oral (09/22 0821) BP: 112/56 (09/22 0821) Pulse Rate: 68 (09/22 0821)  Labs: Recent Labs    03/26/22 1942 03/26/22 1942 03/26/22 2124 03/27/22 0430 03/27/22 0839 03/27/22 1757 03/28/22 0300 03/28/22 1438 03/28/22 2240 03/29/22 0643  HGB 7.3*  --   --  5.8* 5.8* 7.9* 7.5*  --   --  7.7*  HCT 24.5*  --   --  19.5* 19.6* 25.9* 24.4*  --   --  24.8*  PLT 265  --   --  203 211 232 215  --   --  214  APTT  --   --   --   --   --   --   --  30  --   --   LABPROT  --   --   --   --  17.5*  --   --   --   --   --   INR  --   --   --   --  1.5*  --   --   --   --   --   HEPARINUNFRC  --   --   --   --   --   --   --  <0.10* <0.10* 0.26*  CREATININE  --    < > 1.11* 1.00  --   --  0.86  --   --  0.93  TROPONINIHS 9  --  8  --   --   --   --   --   --   --    < > = values in this interval not displayed.    Estimated Creatinine Clearance: 55.9 mL/min (by C-G formula based on SCr of 0.93 mg/dL).   Medications:  On Xarelto PTA.  Assessment: 66 year old female with history of DVT 02/2021 (recent acute on chronic in 02/2022), now here with UTI s/p nephrostomy tube exchange. History of GIB and persistent anemia.   Hgb is 7.5 (however recent baseline seems to be around 7-8). Baseline HL < 0.1, therefore will proceed with dosing via heparin levels.  Goal of Therapy:  Heparin level 0.3-0.7 units/ml  Monitor platelets by anticoagulation protocol: Yes   9/21 2240 HL <0.1, subtherapeutic 9/22 0643 HL 0.26, subtherapeutic   Plan: Restart home Xarelto 20 mg daily. Give Xarelto at time of heparin discontinuation.   Glean Salvo, PharmD Clinical  Pharmacist  03/29/2022 9:45 AM

## 2022-03-29 NOTE — Progress Notes (Signed)
[place] for IV heparin Indication: Hx of DVT on Xarelto  No Known Allergies  Patient Measurements: Height: '4\' 11"'$  (149.9 cm) Weight: 86 kg (189 lb 9.5 oz) IBW/kg (Calculated) : 43.2 Heparin Dosing Weight: 63.6 kg  Vital Signs: Temp: 99 F (37.2 C) (09/21 1956) BP: 108/50 (09/21 1956) Pulse Rate: 70 (09/21 1956)  Labs: Recent Labs    03/26/22 1942 03/26/22 2124 03/27/22 0430 03/27/22 0839 03/27/22 1757 03/28/22 0300 03/28/22 1438 03/28/22 2240  HGB 7.3*  --  5.8* 5.8* 7.9* 7.5*  --   --   HCT 24.5*  --  19.5* 19.6* 25.9* 24.4*  --   --   PLT 265  --  203 211 232 215  --   --   APTT  --   --   --   --   --   --  30  --   LABPROT  --   --   --  17.5*  --   --   --   --   INR  --   --   --  1.5*  --   --   --   --   HEPARINUNFRC  --   --   --   --   --   --  <0.10* <0.10*  CREATININE  --  1.11* 1.00  --   --  0.86  --   --   TROPONINIHS 9 8  --   --   --   --   --   --      Estimated Creatinine Clearance: 60.4 mL/min (by C-G formula based on SCr of 0.86 mg/dL).   Medications:  On Xarelto PTA.  Assessment: 66 year old female with history of DVT 02/2021 (recent acute on chronic in 02/2022), now here with UTI s/p nephrostomy tube exchange. History of GIB and persistent anemia.   Hgb is 7.5 (however recent baseline seems to be around 7-8). Baseline HL < 0.1, therefore will proceed with dosing via heparin levels.  Goal of Therapy:  Heparin level 0.3-0.7 units/ml  Monitor platelets by anticoagulation protocol: Yes   9/21 2240 HL <0.1, subtherapeutic Plan:  Bolus 1900 units x 1 Increase heparin infusion to 1,300 units/hr Recheck HL in 6 hours after rate change Continue to monitor H&H and platelets. Monitor for s/sx bleeding closely. F/u with RN at each dose adjustment.  Renda Rolls, PharmD, [facility] 03/29/2022 12:21 AM

## 2022-03-29 NOTE — Care Management Important Message (Signed)
Important Message  Patient Details  Name: Courtney Mack MRN: [id number] Date of Birth: [birth year]   Medicare Important Message Given:  Yes     [name] 03/29/2022, 11:01 AM

## 2022-03-29 NOTE — Progress Notes (Signed)
[place] for IV heparin Indication: Hx of DVT on Xarelto  No Known Allergies  Patient Measurements: Height: '4\' 11"'$  (149.9 cm) Weight: 86 kg (189 lb 9.5 oz) IBW/kg (Calculated) : 43.2 Heparin Dosing Weight: 63.6 kg  Vital Signs: Temp: 98.8 F (37.1 C) (09/22 0440) BP: 104/41 (09/22 0440) Pulse Rate: 71 (09/22 0440)  Labs: Recent Labs    03/26/22 1942 03/26/22 1942 03/26/22 2124 03/27/22 0430 03/27/22 0839 03/27/22 1757 03/28/22 0300 03/28/22 1438 03/28/22 2240 03/29/22 0643  HGB 7.3*  --   --  5.8* 5.8* 7.9* 7.5*  --   --  7.7*  HCT 24.5*  --   --  19.5* 19.6* 25.9* 24.4*  --   --  24.8*  PLT 265  --   --  203 211 232 215  --   --  214  APTT  --   --   --   --   --   --   --  30  --   --   LABPROT  --   --   --   --  17.5*  --   --   --   --   --   INR  --   --   --   --  1.5*  --   --   --   --   --   HEPARINUNFRC  --   --   --   --   --   --   --  <0.10* <0.10* 0.26*  CREATININE  --    < > 1.11* 1.00  --   --  0.86  --   --  0.93  TROPONINIHS 9  --  8  --   --   --   --   --   --   --    < > = values in this interval not displayed.    Estimated Creatinine Clearance: 55.9 mL/min (by C-G formula based on SCr of 0.93 mg/dL).   Medications:  On Xarelto PTA.  Assessment: 66 year old female with history of DVT 02/2021 (recent acute on chronic in 02/2022), now here with UTI s/p nephrostomy tube exchange. History of GIB and persistent anemia.   Hgb is 7.5 (however recent baseline seems to be around 7-8). Baseline HL < 0.1, therefore will proceed with dosing via heparin levels.  Goal of Therapy:  Heparin level 0.3-0.7 units/ml  Monitor platelets by anticoagulation protocol: Yes   9/21 2240 HL <0.1, subtherapeutic 9/22 0643 HL 0.26, subtherapeutic   Plan:  Bolus 1000 units x 1 Increase heparin infusion to 1,400 units/hr Recheck HL in 6 hours after rate change Continue to monitor H&H and platelets. Monitor for s/sx bleeding  closely. F/u with RN at each dose adjustment.   Glean Salvo, PharmD Clinical Pharmacist  03/29/2022 7:30 AM

## 2022-03-29 NOTE — Telephone Encounter (Signed)
Please r/s appts for 3 weeks and inform pt   Labs in 3 weeks  MD/ venofer 1-2 days AFTER labs

## 2022-03-29 NOTE — Discharge Summary (Signed)
Physician Discharge Summary   Patient: Courtney Mack MRN: [id number] DOB: 1956-01-04  Admit date:     03/26/2022  Discharge date: 03/29/2022  Discharge Physician: Berle Mull  PCP: [facility] [place]  Recommendations at discharge: Follow-up with PCP.  Follow-up with vascular surgery. Follow-up with urology.   Follow-up [place], [facility]. Schedule an appointment as soon as possible for a visit in 2 week(s).   Specialty: General Practice Contact information: [place] [address] [phone]         Algernon Huxley, MD. Schedule an appointment as soon as possible for a visit in 1 month(s).   Specialties: Vascular Surgery, Radiology, Interventional Cardiology Why: Discuss need for unna boots  .office closed at this time patient to make own follow up appt Contact information: [place] [address] [phone]                Discharge Diagnoses: Principal Problem:   Complicated UTI (urinary tract infection) Active Problems:   Symptomatic anemia   Chronic diastolic CHF (congestive heart failure) ([place])   Morbidly obese (HCC)   Endometrial cancer (HCC)   IDA (iron deficiency anemia)   HLD (hyperlipidemia)   Dyslipidemia   Chronic radiation cystitis   Lymphedema  Hospital Course: PMH of DVT PE on Xarelto, HLD, IDA, GERD, left PCN presents with fatigue and left flank pain.  Also found to have symptomatic anemia with hemoglobin of 5.8.  S/P 2 PRBC transfusion.  Urology was consulted.  No bleeding seen on therapeutic IV heparin and patient was transitioned to oral antibiotics and oral anticoagulation and discharged home  Assessment and Plan  Bilateral hydronephrosis S/P left PCN placement. Complicated UTI. Underwent nephrogram and nephrostomy tube exchange.  Urology was consulted. Treated with IV cefepime. Blood culture remain negative. Urine culture grew  significant quantities of multiple species. Based on prior culture patient was discharged on oral Bactrim. Outpatient follow-up with urology recommended.   Iron deficiency anemia, acute on chronic. Acute hemoglobin drop most likely secondary to dilutional. Hemoglobin dropped down to 5.8. No active bleeding seen outside. SP 2 PRBC transfusion with appropriate elevation in hemoglobin. Patient was started on IV heparin and no active bleeding was seen. Hemoglobin also remained stable after transfusion. Currently we will transition to oral Xarelto and discharged home. Continue iron supplementation.   Acute kidney injury. Treated with IV fluids.  Baseline serum creatinine 0.8.  Currently creatinine normal.   History of DVT PE. On Xarelto. Currently holding due to concerns with anemia.   GERD.  GAVE Continue PPI. EGD performed on 03/15/2022 Gastric antral vascular ectasia without bleeding. Treated with argon plasma coagulation (APC).  If bleeding reoccurs recommendation is to consider cryotherapy for recurrent bleeding from GAVE.   Chronic lower extremity lymphedema and wounds. Continue current therapy. Outpatient follow-up with vascular surgery recommended.   HLD. Continue statin.   Hypokalemia. Resolved.   Elevated troponin.  No evidence of elevated troponin.   Obesity Body mass index is 38.29 kg/m.  Placing the pt at higher risk of poor outcomes.   Consultants:  Urology IR  Procedures performed:  Percutaneous nephrostomy tube exchange  DISCHARGE MEDICATION: Allergies as of 03/29/2022   No Known Allergies      Medication List     STOP taking these medications    loperamide 2 MG capsule Commonly known as: IMODIUM       TAKE these medications    acetaminophen  500 MG tablet Commonly known as: TYLENOL Take 500 mg by mouth every 6 (six) hours as needed for mild pain or fever.   cyanocobalamin 1000 MCG tablet Take 1 tablet (1,000 mcg total) by mouth daily.    feeding supplement Liqd Take 237 mLs by mouth 3 (three) times daily between meals.   ferrous sulfate 325 (65 FE) MG tablet Take 1 tablet (325 mg total) by mouth 2 (two) times daily with a meal.   fluticasone furoate-vilanterol 100-25 MCG/ACT Aepb Commonly known as: BREO ELLIPTA Inhale 1 puff into the lungs daily.   furosemide 20 MG tablet Commonly known as: Lasix Hold due to low blood pressure. What changed:  how much to take how to take this when to take this additional instructions   mometasone-formoterol 100-5 MCG/ACT Aero Commonly known as: DULERA Inhale 2 puffs into the lungs 2 (two) times daily.   multivitamin with minerals Tabs tablet Take 1 tablet by mouth daily.   pantoprazole 40 MG tablet Commonly known as: PROTONIX Take 1 tablet (40 mg total) by mouth 2 (two) times daily.   polyethylene glycol 17 g packet Commonly known as: MIRALAX / GLYCOLAX Take 17 g by mouth daily as needed for mild constipation.   rivaroxaban 20 MG Tabs tablet Commonly known as: XARELTO Take 1 tablet (20 mg total) by mouth daily. What changed: additional instructions   rosuvastatin 10 MG tablet Commonly known as: CRESTOR Take 1 tablet (10 mg total) by mouth daily. What changed: when to take this   sulfamethoxazole-trimethoprim 800-160 MG tablet Commonly known as: BACTRIM DS Take 1 tablet by mouth 2 (two) times daily for 7 days.   vitamin C 1000 MG tablet Take 1,000 mg by mouth daily.               Discharge Care Instructions  (From admission, onward)           Start     Ordered   03/29/22 0000  Discharge wound care:       Comments: Xeroform gauze to the open wounds on the both leg wounds.  Cover with foam. Change every other day   03/29/22 0925           Disposition: Home Diet recommendation: Cardiac diet  Discharge Exam: Vitals:   03/29/22 0033 03/29/22 0440 03/29/22 0821 03/29/22 1310  BP: 106/63 (!) 104/41 (!) 112/56 (!) 125/55  Pulse: 73 71 68 74   Resp: '17 17 15 16  '$ Temp: 98.5 F (36.9 C) 98.8 F (37.1 C) 97.9 F (36.6 C) 98.4 F (36.9 C)  TempSrc:   Oral   SpO2: 94% 97% 97% 97%  Weight:      Height:       General: Appear in no distress; no visible Abnormal Neck Mass Or lumps, Conjunctiva normal Cardiovascular: S1 and S2 Present, no Murmur, Respiratory: good respiratory effort, Bilateral Air entry present and CTA, no Crackles, no wheezes Abdomen: Bowel Sound present, Non tender  Extremities: bilateral Pedal edema Neurology: alert and oriented to time, place, and person  Filed Weights   03/27/22 1726  Weight: 86 kg   Condition at discharge: stable  The results of significant diagnostics from this hospitalization (including imaging, microbiology, ancillary and laboratory) are listed below for reference.   Imaging Studies: IR NEPHROSTOMY EXCHANGE LEFT  Result Date: 03/27/2022 INDICATION: Left flank pain EXAM: Exchange of left nephrostomy tube using fluoroscopic guidance COMPARISON:  None Available. MEDICATIONS: Per EMR ANESTHESIA/SEDATION: Local analgesia CONTRAST:  4 mL Omnipaque 300-administered into  the collecting system(s) FLUOROSCOPY TIME:  Fluoroscopy Time: 0.8 minutes (7 mGy) COMPLICATIONS: None immediate. PROCEDURE: Informed written consent was obtained from the patient after a thorough discussion of the procedural risks, benefits and alternatives. All questions were addressed. Maximal Sterile Barrier Technique was utilized including caps, mask, sterile gowns, sterile gloves, sterile drape, hand hygiene and skin antiseptic. A timeout was performed prior to the initiation of the procedure. Patient was placed prone on the exam table. The left flank was prepped and draped in the standard sterile fashion with inclusion of the existing nephrostomy tube within the sterile field. Injection of the existing nephrostomy tube demonstrated slight retraction of the proximal side-hole. The loop remain well formed within the renal pelvis.  Decision was made to proceed with exchange of the nephrostomy tube. Locking loop was released, and over an 035 Amplatz wire, the existing nephrostomy tube was removed and a new, similar ten [place] nephrostomy tube was advanced into the renal pelvis. Location was confirmed with injection of contrast material opacifying appropriate location within the renal pelvis. The catheter was secured to skin using silk suture and a dressing. It was attached to bag drainage. Patient tolerated the procedure well without immediate complication. IMPRESSION: 1. Successful exchange of left percutaneous nephrostomy tube for a new, similar 10 French nephrostomy tube. Nephrostomy tube placed to bag drainage. 2. Patient will be scheduled for routine exchange in 8 weeks. 3. Skin breakdown noted at tube entry site. Patient instructed to keep the area dry with more frequent dressing changes. Electronically Signed   By: Albin Felling M.D.   On: 03/27/2022 11:47   CT ABDOMEN PELVIS WO CONTRAST  Result Date: 03/26/2022 CLINICAL DATA:  Abdominal pain and decreased output from left-sided nephrostomy back EXAM: CT ABDOMEN AND PELVIS WITHOUT CONTRAST TECHNIQUE: Multidetector CT imaging of the abdomen and pelvis was performed following the standard protocol without IV contrast. RADIATION DOSE REDUCTION: This exam was performed according to the departmental dose-optimization program which includes automated exposure control, adjustment of the mA and/or kV according to patient size and/or use of iterative reconstruction technique. COMPARISON:  03/12/2022 FINDINGS: Lower chest: Minimal scarring is noted in the bases bilaterally. Cardiac blood pool is diffusely decreased in attenuation suspicious for underlying anemia. Hepatobiliary: No focal liver abnormality is seen. No gallstones, gallbladder wall thickening, or biliary dilatation. Pancreas: Unremarkable. No pancreatic ductal dilatation or surrounding inflammatory changes. Spleen: Normal in size  without focal abnormality. Adrenals/Urinary Tract: Adrenal glands are within normal limits bilaterally. Right kidney demonstrates a tiny nonobstructing stone stable from the prior exam. Left kidney demonstrates a left-sided nephrostomy catheter in satisfactory position. No obstructive changes are seen. The bladder is decompressed. Stomach/Bowel: Colon shows no obstructive or inflammatory changes. The appendix is not well visualized and may have been surgically removed. No inflammatory changes to suggest appendicitis are seen. Small bowel and stomach are unremarkable. Vascular/Lymphatic: Aortic atherosclerosis. No enlarged abdominal or pelvic lymph nodes. IVC filter is noted in satisfactory position. Reproductive: Uterus and bilateral adnexa are unremarkable. Other: No abdominal wall hernia or abnormality. No abdominopelvic ascites. Mild changes of anasarca are noted. Musculoskeletal: Chronic degenerative changes of the hip joints are noted bilaterally. Degenerative changes of lumbar spine are seen. Mild anterolisthesis of L5 on S1 is seen IMPRESSION: Left nephrostomy catheter in satisfactory position. Tiny nonobstructing right renal stone. Findings suspicious for underlying anemia. No other focal acute abnormality is noted. Electronically Signed   By: Inez Catalina M.D.   On: 03/26/2022 19:52   CT ANGIO GI BLEED  Result Date: 03/12/2022 CLINICAL DATA:  Lower GI bleed (Ped 0-17y). Pt here with weakness and c/o her urinary bag not filling like it usually does. Pt also has back pain and states she just does not feel well EXAM: CTA ABDOMEN AND PELVIS WITHOUT AND WITH CONTRAST TECHNIQUE: Multidetector CT imaging of the abdomen and pelvis was performed using the standard protocol during bolus administration of intravenous contrast. Multiplanar reconstructed images and MIPs were obtained and reviewed to evaluate the vascular anatomy. RADIATION DOSE REDUCTION: This exam was performed according to the departmental  dose-optimization program which includes automated exposure control, adjustment of the mA and/or kV according to patient size and/or use of iterative reconstruction technique. CONTRAST:  159m OMNIPAQUE IOHEXOL 350 MG/ML SOLN COMPARISON:  None Available. FINDINGS: VASCULAR No extravasation of contrast within the gastrointestinal tract to suggest active bleed. Aorta: Normal caliber aorta without aneurysm, dissection, vasculitis or significant stenosis. Celiac: Patent without evidence of aneurysm, dissection, vasculitis or significant stenosis. SMA: Patent without evidence of aneurysm, dissection, vasculitis or significant stenosis. Renals: Both renal arteries are patent without evidence of aneurysm, dissection, vasculitis, fibromuscular dysplasia or significant stenosis. IMA: Patent without evidence of aneurysm, dissection, vasculitis or significant stenosis. Inflow: Inferior vena cava filter noted in grossly appropriate position. The main portal, splenic, superior mesenteric veins are patent. No definite thrombus within the IVC filter. Proximal Outflow: Bilateral common femoral and visualized portions of the superficial and profunda femoral arteries are patent without evidence of aneurysm, dissection, vasculitis or significant stenosis. Veins: No obvious venous abnormality within the limitations of this arterial phase study. Review of the MIP images confirms the above findings. NON-VASCULAR Lower chest: No acute abnormality. Hepatobiliary: No focal liver abnormality. No gallstones, gallbladder wall thickening, or pericholecystic fluid. No biliary dilatation. Pancreas: No focal lesion. Normal pancreatic contour. No surrounding inflammatory changes. No main pancreatic ductal dilatation. Spleen: Normal in size without focal abnormality. Adrenals/Urinary Tract: No adrenal nodule bilaterally. Percutaneous nephrostomy tube on the left with pigtail terminating within the left renal pelvis. Vague urothelial thickening  associated with this likely due to inflammation in the setting of hardware. Bilateral kidneys enhance symmetrically. Punctate right nephrolithiasis. No ureterolithiasis bilaterally. No left nephrolithiasis. No hydronephrosis. No hydroureter. The urinary bladder is decompressed with circumferential urinary bladder wall thickening. Vague perivesicular fat stranding. Stomach/Bowel: Stomach is within normal limits. No evidence of bowel wall thickening or dilatation. Appendix appears normal. Lymphatic: Asymmetric left periaortic prominent lymph nodes. No lymphadenopathy. Reproductive: Uterus and bilateral adnexa are unremarkable. Other: No intraperitoneal free fluid. No intraperitoneal free gas. No organized fluid collection. Musculoskeletal: Subcutaneus soft tissue edema. No suspicious lytic or blastic osseous lesions. No acute displaced fracture. Grade 1 anterolisthesis of L5 on S1. Bilateral severe hip degenerative changes. IMPRESSION: VASCULAR 1. No CT evidence of active bleed. 2. No acute abdominal aorta abnormality. 3.  Aortic Atherosclerosis (ICD10-I70.0). NON-VASCULAR 1. Left percutaneous nephrostomy tube in appropriate position with associated vague urothelial thickening as well as mild perivesicular fat stranding. Recommend correlation with urinalysis to exclude superimposed infection. 2. Punctate nonobstructive right nephrolithiasis. Electronically Signed   By: MIven FinnM.D.   On: 03/12/2022 20:13   DG Chest 2 View  Result Date: 03/12/2022 CLINICAL DATA:  Sepsis. EXAM: CHEST - 2 VIEW COMPARISON:  February 23, 2022. FINDINGS: The heart size and mediastinal contours are within normal limits. Both lungs are clear. The visualized skeletal structures are unremarkable. IMPRESSION: No active cardiopulmonary disease. Electronically Signed   By: JMarijo ConceptionM.D.   On: 03/12/2022 15:59  Microbiology: Results for orders placed or performed during the hospital encounter of 03/26/22  Blood culture  (routine x 2)     Status: None (Preliminary result)   Collection Time: 03/26/22  7:42 PM   Specimen: Left Antecubital; Blood  Result Value Ref Range Status   Specimen Description LEFT ANTECUBITAL  Final   Special Requests   Final    BOTTLES DRAWN AEROBIC AND ANAEROBIC Blood Culture adequate volume   Culture   Final    NO GROWTH 3 DAYS Performed at [facility], [address] [place], [place] [address]    Report Status PENDING  Incomplete  Urine Culture     Status: Abnormal   Collection Time: 03/26/22  9:24 PM   Specimen: Urine, Clean Catch  Result Value Ref Range Status   Specimen Description   Final    URINE, CLEAN CATCH Performed at [facility], [address] [place], [place] [address]    Special Requests   Final    NONE Performed at [facility], [address] [place], [place] [address]    Culture (A)  Final    >=100,000 COLONIES/mL MULTIPLE SPECIES PRESENT, SUGGEST RECOLLECTION   Report Status 03/28/2022 FINAL  Final   Labs: CBC: Recent Labs  Lab 03/27/22 0430 03/27/22 0839 03/27/22 1757 03/28/22 0300 03/29/22 0643  WBC 4.2 3.9* 4.5 3.7* 4.2  NEUTROABS  --  2.9  --   --   --   HGB 5.8* 5.8* 7.9* 7.5* 7.7*  HCT 19.5* 19.6* 25.9* 24.4* 24.8*  MCV 80.9 81.0 80.9 81.3 81.3  PLT 203 211 232 215 086   Basic Metabolic Panel: Recent Labs  Lab 03/26/22 2124 03/27/22 0430 03/28/22 0300 03/29/22 0643  NA 134* 136 138 140  K 3.1* 3.4* 4.2 3.8  CL 104 107 110 114*  CO2 '23 22 22 23  '$ GLUCOSE 110* 136* 97 137*  BUN 28* 26* 20 24*  CREATININE 1.11* 1.00 0.86 0.93  CALCIUM 8.2* 7.8* 8.0* 8.2*  MG  --  1.7 2.0 1.8  PHOS  --  3.5 3.3 3.2   Liver Function Tests: Recent Labs  Lab 03/26/22 2124 03/28/22 0300 03/29/22 0643  AST 24  --   --   ALT 15  --   --   ALKPHOS 46  --   --   BILITOT 1.4*  --   --   PROT 6.6  --   --   ALBUMIN 2.9* 2.3* 2.1*   CBG: No results for input(s): "GLUCAP" in the last 168 hours.  Discharge  time spent: greater than 30 minutes.  Signed: Berle Mull, MD [facility] 03/29/2022

## 2022-03-29 NOTE — Progress Notes (Signed)
Met with the patient at the bedside, she lives at home with her daughter, she has DME at home and doe snot want more, she refuses HH at this time, her daughter to provide transportation

## 2022-03-30 LAB — TYPE AND SCREEN
ABO/RH(D): A POS
Antibody Screen: POSITIVE
Donor AG Type: NEGATIVE
Donor AG Type: NEGATIVE
Unit division: 0
Unit division: 0
Unit division: 0
Unit division: 0
Unit division: 0

## 2022-03-30 LAB — BPAM RBC
Blood Product Expiration Date: 202310082359
Blood Product Expiration Date: 202310132359
Blood Product Expiration Date: 202310172359
Blood Product Expiration Date: 202310182359
Blood Product Expiration Date: 202310192359
ISSUE DATE / TIME: 202309201126
ISSUE DATE / TIME: 202309201331
ISSUE DATE / TIME: 202309210927
Unit Type and Rh: 5100
Unit Type and Rh: 6200
Unit Type and Rh: 6200
Unit Type and Rh: 6200
Unit Type and Rh: 6200

## 2022-03-30 LAB — PREPARE RBC (CROSSMATCH)

## 2022-03-31 LAB — CULTURE, BLOOD (ROUTINE X 2)
Culture: NO GROWTH
Special Requests: ADEQUATE

## 2022-04-02 ENCOUNTER — Inpatient Hospital Stay: Payer: Medicaid Other | Admitting: Oncology

## 2022-04-02 ENCOUNTER — Inpatient Hospital Stay: Payer: Medicaid Other

## 2022-04-11 ENCOUNTER — Encounter: Payer: Self-pay | Admitting: Family

## 2022-04-11 ENCOUNTER — Ambulatory Visit: Payer: Medicare HMO | Attending: Family | Admitting: Family

## 2022-04-11 VITALS — BP 107/51 | HR 90 | Resp 18 | Ht 59.0 in | Wt 173.1 lb

## 2022-04-11 DIAGNOSIS — R5383 Other fatigue: Secondary | ICD-10-CM | POA: Diagnosis not present

## 2022-04-11 DIAGNOSIS — M7989 Other specified soft tissue disorders: Secondary | ICD-10-CM | POA: Diagnosis not present

## 2022-04-11 DIAGNOSIS — N390 Urinary tract infection, site not specified: Secondary | ICD-10-CM | POA: Insufficient documentation

## 2022-04-11 DIAGNOSIS — I110 Hypertensive heart disease with heart failure: Secondary | ICD-10-CM | POA: Insufficient documentation

## 2022-04-11 DIAGNOSIS — Z833 Family history of diabetes mellitus: Secondary | ICD-10-CM | POA: Insufficient documentation

## 2022-04-11 DIAGNOSIS — R059 Cough, unspecified: Secondary | ICD-10-CM | POA: Insufficient documentation

## 2022-04-11 DIAGNOSIS — E119 Type 2 diabetes mellitus without complications: Secondary | ICD-10-CM | POA: Insufficient documentation

## 2022-04-11 DIAGNOSIS — I10 Essential (primary) hypertension: Secondary | ICD-10-CM | POA: Diagnosis not present

## 2022-04-11 DIAGNOSIS — Z87440 Personal history of urinary (tract) infections: Secondary | ICD-10-CM | POA: Insufficient documentation

## 2022-04-11 DIAGNOSIS — I890 Lymphedema, not elsewhere classified: Secondary | ICD-10-CM | POA: Insufficient documentation

## 2022-04-11 DIAGNOSIS — I5032 Chronic diastolic (congestive) heart failure: Secondary | ICD-10-CM | POA: Insufficient documentation

## 2022-04-11 DIAGNOSIS — M549 Dorsalgia, unspecified: Secondary | ICD-10-CM | POA: Diagnosis not present

## 2022-04-11 DIAGNOSIS — D649 Anemia, unspecified: Secondary | ICD-10-CM | POA: Insufficient documentation

## 2022-04-11 DIAGNOSIS — G8929 Other chronic pain: Secondary | ICD-10-CM | POA: Insufficient documentation

## 2022-04-11 DIAGNOSIS — R0602 Shortness of breath: Secondary | ICD-10-CM | POA: Diagnosis not present

## 2022-04-11 DIAGNOSIS — R600 Localized edema: Secondary | ICD-10-CM | POA: Insufficient documentation

## 2022-04-11 NOTE — Patient Instructions (Signed)
Continue weighing daily and call for an overnight weight gain of 3 pounds or more or a weekly weight gain of more than 5 pounds.   If you have voicemail, please make sure your mailbox is cleaned out so that we may leave a message and please make sure to listen to any voicemails.     

## 2022-04-11 NOTE — Progress Notes (Signed)
Patient ID: Courtney Mack, female    DOB: 1956-04-24, 66 y.o.   MRN: [id number]  HPI  Courtney Mack is a 66 y/o female with a history of DM, hyperlipidemia, HTN, DVT, PE, anemia, thrombocytopenia, tobacco use and chronic heart failure.    Echo report from 02/25/22 reviewed and showed an EF of 65-70%. Echo report from 09/12/21 reviewed and showed an EF of 55-60% along with mild LVH/ LAE & mild MR.    Admitted twice in September and once in August and July. Admitted 12/24/21 due to hematuria. Urology and palliative care consults obtained. CT showed left hydronephrosis with left ureteral stent in place.  No obstructing stones identified. Urology consulted and recommended left nephrostomy tube which was placed on 6/22. Given antibiotics for UTI. Discharged after 7 days. Admitted 11/16/21 due to recurrent gross hematuria and anemia. Urology and medical oncology consults obtained. Chest CT showed small right PE. Heparin drip started.  S/p cystoscopy with removal and replacement of left double-J stent. Discharged after 3 days. Admitted 10/16/21 due to c/o abdominal pain, generalized weakness, fevers and chills.Patient underwent left ureteroscopy on 4/12 which was negative for obstructive stone. Needed vasopressin support due to hypotension. Urology and palliative care consults obtained. Given PRBC's due to anemia. Antibiotics given due to sepsis. Initially needed oxygen but able to be weaned to room air. Xarelto held. Discharged after 7 days. Admitted 10/12/21 due to hemoglobin of 6.4. Two units of PRBC's given along with IV protonix. GI consult obtained. EGD completed. Discharged after 2 days.    She presents today for a follow-up visit with a chief complaint of minimal fatigue upon moderate exertion. Describes this as chronic in nature. She has associated cough, shortness of breath, pedal edema (improving), easy bruising and chronic back pain along with this. She denies any difficulty sleeping, abdominal  distention, palpitations, chest pain, dizziness or weight gain.   Says that she thought she wouldn't have anymore infections once this renal stent had been placed but it keeps occurring.   Has been wearing compression boots daily for about a week and says that her leg swelling has improved.   Past Medical History:  Diagnosis Date   Acute bilateral deep vein thrombosis (DVT) of femoral veins ([place]) 01/02/2021   Acute massive pulmonary embolism ([place]) 08/25/2020   Last Assessment & Plan:  Formatting of this note might be different from the original. Post vena caval filter and on xarelto and O2   Anemia    ARF (acute respiratory failure) ([place])    Cellulitis of left lower leg 06/20/2021   CHF (congestive heart failure) ([place])    COVID-19    Diabetes mellitus without complication ([place])    DVT (deep venous thrombosis) ([place]) 09/06/2020   Endometrial cancer ([facility])    GAVE (gastric antral vascular ectasia)    GERD (gastroesophageal reflux disease)    High cholesterol    Hx of blood clots    Hyperlipidemia    Hypertension    IDA (iron deficiency anemia) 09/21/2020   Obesity    Pressure injury of skin 06/21/2021   Pulmonary embolism ([place])    Sepsis ([place])    Thrombocytopenia ([place])    Urinary tract infection 09/13/2021   Past Surgical History:  Procedure Laterality Date   COLONOSCOPY N/A 06/21/2021   Procedure: COLONOSCOPY;  Surgeon: Toledo, Benay Pike, MD;  Location: [facility] ENDOSCOPY;  Service: Gastroenterology;  Laterality: N/A;   CYSTOSCOPY W/ URETERAL STENT PLACEMENT Left 09/11/2021   Procedure: CYSTOSCOPY WITH RETROGRADE PYELOGRAM/URETERAL STENT  PLACEMENT;  Surgeon: Abbie Sons, MD;  Location: [facility] ORS;  Service: Urology;  Laterality: Left;   CYSTOSCOPY W/ URETERAL STENT PLACEMENT Left 11/17/2021   Procedure: CYSTOSCOPY WITH STENT REPLACEMENT;  Surgeon: Irine Seal, MD;  Location: [facility] ORS;  Service: Urology;  Laterality: Left;   CYSTOSCOPY WITH FULGERATION  11/17/2021   Procedure:  CYSTOSCOPY WITH FULGERATION;  Surgeon: Irine Seal, MD;  Location: [facility] ORS;  Service: Urology;;   CYSTOSCOPY WITH STENT PLACEMENT Left 10/17/2021   Procedure: CYSTOSCOPY WITH STENT PLACEMENT;  Surgeon: Abbie Sons, MD;  Location: [facility] ORS;  Service: Urology;  Laterality: Left;   CYSTOSCOPY WITH URETEROSCOPY, STONE BASKETRY AND STENT PLACEMENT Left 10/16/2021   Procedure: CYSTOSCOPY WITH URETEROSCOPY  AND STENT REMOVAL;  Surgeon: Abbie Sons, MD;  Location: [facility] ORS;  Service: Urology;  Laterality: Left;   ENTEROSCOPY N/A 08/17/2021   Procedure: ENTEROSCOPY;  Surgeon: Lin Landsman, MD;  Location: [facility] ENDOSCOPY;  Service: Gastroenterology;  Laterality: N/A;   ESOPHAGOGASTRODUODENOSCOPY N/A 06/21/2021   Procedure: ESOPHAGOGASTRODUODENOSCOPY (EGD);  Surgeon: Toledo, Benay Pike, MD;  Location: [facility] ENDOSCOPY;  Service: Gastroenterology;  Laterality: N/A;   ESOPHAGOGASTRODUODENOSCOPY (EGD) WITH PROPOFOL N/A 08/17/2021   Procedure: ESOPHAGOGASTRODUODENOSCOPY (EGD) WITH PROPOFOL;  Surgeon: Lin Landsman, MD;  Location: [facility] ENDOSCOPY;  Service: Gastroenterology;  Laterality: N/A;   ESOPHAGOGASTRODUODENOSCOPY (EGD) WITH PROPOFOL N/A 10/14/2021   Procedure: ESOPHAGOGASTRODUODENOSCOPY (EGD) WITH PROPOFOL;  Surgeon: [name], MD;  Location: [facility] ENDOSCOPY;  Service: Endoscopy;  Laterality: N/A;   ESOPHAGOGASTRODUODENOSCOPY (EGD) WITH PROPOFOL N/A 02/01/2022   Procedure: ESOPHAGOGASTRODUODENOSCOPY (EGD) WITH PROPOFOL;  Surgeon: Lin Landsman, MD;  Location: [facility] ENDOSCOPY;  Service: Gastroenterology;  Laterality: N/A;   ESOPHAGOGASTRODUODENOSCOPY (EGD) WITH PROPOFOL N/A 03/15/2022   Procedure: ESOPHAGOGASTRODUODENOSCOPY (EGD) WITH PROPOFOL;  Surgeon: Jonathon Bellows, MD;  Location: [facility] ENDOSCOPY;  Service: Gastroenterology;  Laterality: N/A;   GIVENS CAPSULE STUDY N/A 06/22/2021   Procedure: GIVENS CAPSULE STUDY;  Surgeon: Toledo, Benay Pike, MD;  Location: [facility] ENDOSCOPY;  Service:  Gastroenterology;  Laterality: N/A;   IR NEPHROSTOMY EXCHANGE LEFT  02/25/2022   IR NEPHROSTOMY EXCHANGE LEFT  03/27/2022   IR NEPHROSTOMY PLACEMENT LEFT  12/27/2021   IVC FILTER INSERTION N/A 08/18/2020   Procedure: IVC FILTER INSERTION;  Surgeon: Katha Cabal, MD;  Location: [place] CV LAB;  Service: Cardiovascular;  Laterality: N/A;   PERIPHERAL VASCULAR THROMBECTOMY Bilateral 01/03/2021   Procedure: PERIPHERAL VASCULAR THROMBECTOMY;  Surgeon: Algernon Huxley, MD;  Location: [place] CV LAB;  Service: Cardiovascular;  Laterality: Bilateral;   PULMONARY THROMBECTOMY N/A 08/18/2020   Procedure: PULMONARY THROMBECTOMY;  Surgeon: Katha Cabal, MD;  Location: [place] CV LAB;  Service: Cardiovascular;  Laterality: N/A;   TUBAL LIGATION     VISCERAL ANGIOGRAPHY N/A 07/18/2021   Procedure: VISCERAL ANGIOGRAPHY;  Surgeon: Algernon Huxley, MD;  Location: [place] CV LAB;  Service: Cardiovascular;  Laterality: N/A;   Family History  Problem Relation Age of Onset   Hypertension Mother    Diabetes Mother    Diabetes Father    Hypertension Father    High Cholesterol Father    Congestive Heart Failure Father    Breast cancer Cousin    Social History   Tobacco Use   Smoking status: Never   Smokeless tobacco: Never   Tobacco comments:    smoked 2-4 cigarettes for about 2-3 weeks   Substance Use Topics   Alcohol use: Not Currently   No Known Allergies  Prior to Admission medications  Medication Sig Start Date End Date Taking? Authorizing Provider  acetaminophen (TYLENOL) 500 MG tablet Take 500 mg by mouth every 6 (six) hours as needed for mild pain or fever.   Yes [provider]  Ascorbic Acid (VITAMIN C) 1000 MG tablet Take 1,000 mg by mouth daily.   Yes [provider]  cyanocobalamin 1000 MCG tablet Take 1 tablet (1,000 mcg total) by mouth daily. 02/03/22  Yes Fritzi Mandes, MD  ferrous sulfate 325 (65 FE) MG tablet Take 1 tablet (325 mg total) by  mouth 2 (two) times daily with a meal. 03/16/22  Yes Lorella Nimrod, MD  fluticasone furoate-vilanterol (BREO ELLIPTA) 100-25 MCG/ACT AEPB Inhale 1 puff into the lungs daily.   Yes [provider]  furosemide (LASIX) 20 MG tablet Hold due to low blood pressure. Patient taking differently: Take 20 mg by mouth daily. 12/31/21  Yes Enzo Bi, MD  mometasone-formoterol [facility]) 100-5 MCG/ACT AERO Inhale 2 puffs into the lungs 2 (two) times daily.   Yes [provider]  Multiple Vitamin (MULTIVITAMIN WITH MINERALS) TABS tablet Take 1 tablet by mouth daily. 09/12/20  Yes [name], MD  pantoprazole (PROTONIX) 40 MG tablet Take 1 tablet (40 mg total) by mouth 2 (two) times daily. 03/01/22 04/30/22 Yes Sreenath, Sudheer B, MD  rivaroxaban (XARELTO) 20 MG TABS tablet Take 1 tablet (20 mg total) by mouth daily. 03/29/22 06/27/22 Yes Lavina Hamman, MD  rosuvastatin (CRESTOR) 10 MG tablet Take 1 tablet (10 mg total) by mouth daily. Patient taking differently: Take 10 mg by mouth in the morning. 10/03/20  Yes Earlie Server, MD  feeding supplement (ENSURE ENLIVE / ENSURE PLUS) LIQD Take 237 mLs by mouth 3 (three) times daily between meals. Patient not taking: Reported on 04/11/2022 02/02/22   Fritzi Mandes, MD  polyethylene glycol (MIRALAX / GLYCOLAX) 17 g packet Take 17 g by mouth daily as needed for mild constipation. Patient not taking: Reported on 04/11/2022 03/29/22   Lavina Hamman, MD    Review of Systems  Constitutional:  Positive for fatigue. Negative for appetite change.  HENT:  Negative for congestion, postnasal drip and sore throat.   Eyes:  Positive for visual disturbance (due to cataracts).  Respiratory:  Positive for cough and shortness of breath. Negative for chest tightness.   Cardiovascular:  Positive for leg swelling (improving). Negative for chest pain and palpitations.  Gastrointestinal:  Negative for abdominal distention and abdominal pain.  Endocrine: Negative.    Genitourinary: Negative.   Musculoskeletal:  Positive for back pain (at times). Negative for arthralgias.  Skin: Negative.   Allergic/Immunologic: Negative.   Neurological:  Negative for dizziness and light-headedness.  Hematological:  Negative for adenopathy. Bruises/bleeds easily.  Psychiatric/Behavioral:  Negative for dysphoric mood and sleep disturbance (sleeps in reclincer due to comfort). The patient is not nervous/anxious.    Vitals:   04/11/22 1021  BP: (!) 107/51  Pulse: 90  Resp: 18  SpO2: 98%  Weight: 173 lb 2 oz (78.5 kg)  Height: '4\' 11"'$  (1.499 m)   Wt Readings from Last 3 Encounters:  04/11/22 173 lb 2 oz (78.5 kg)  03/27/22 189 lb 9.5 oz (86 kg)  03/14/22 186 lb 11.7 oz (84.7 kg)   Lab Results  Component Value Date   CREATININE 0.93 03/29/2022   CREATININE 0.86 03/28/2022   CREATININE 1.00 03/27/2022   Physical Exam Vitals and nursing note reviewed.  Constitutional:      Appearance: Normal appearance.  HENT:  Head: Normocephalic and atraumatic.  Cardiovascular:     Rate and Rhythm: Normal rate and regular rhythm.  Pulmonary:     Effort: Pulmonary effort is normal. No respiratory distress.     Breath sounds: No wheezing or rales.  Abdominal:     General: There is no distension.     Palpations: Abdomen is soft.  Musculoskeletal:        General: No tenderness.     Cervical back: Normal range of motion and neck supple.     Right lower leg: Edema (+ lymphedema) present.     Left lower leg: Edema (+ lymphedema) present.  Skin:    General: Skin is warm and dry.  Neurological:     General: No focal deficit present.     Mental Status: She is alert and oriented to person, place, and time.  Psychiatric:        Behavior: Behavior normal.        Thought Content: Thought content normal.    Assessment & Plan:   1: Chronic heart failure with preserved ejection fraction without structural changes- - NYHA class II - euvolemic today - weighing daily;  reminded to call for an overnight weight gain of > 2 pounds or a weekly weight gain of > 5 pounds - not adding salt except on rare occasions;  - saw cardiology Rockey Situ) 10/04/21 - recurrent UTI's so not a candidate for SGLT2 - BNP 03/26/22 was 86.5   2: HTN- - BP looks good (107/51) - follows with PCP at [place] 03/29/22 reviewed and showed sodium 140, potassium 3.8, creatinine 0.93 and GFR >60   3: DM- - A1c 11/16/21 was 5.7%   4: Anemia- - hemoglobin 03/29/22 was 7.7 - saw hematology Tasia Catchings) 11/26/21  5: Lymphedema- - stage 2 - limited in her ability to exercise due to leg swelling - unable to wear compression socks - does elevate her legs in a recliner but swelling never completely resolves - saw vascular ([place]) 02/08/22 - wearing compression boots daily with improvement of lymphedema  6: Recurrent UTI- - saw urology Bernardo Heater) 12/14/21 - Underwent nephrogram and nephrostomy tube exchange during recent admission    Patient did not bring her medications nor a list. Each medication was verbally reviewed with the patient and she was encouraged to bring the bottles to every visit to confirm accuracy of list.   Return in 3 months, sooner if needed.

## 2022-04-15 NOTE — Progress Notes (Deleted)
04/16/2022 10:17 AM   Courtney Mack [birth year] [id number]  Referring provider: [facility], [place],  [place] [address]  Urological history: 1. High risk hematuria -former smoker -several contrast and non-contrast CT's over the last year -no findings of GU malignancies  -cysto (11/2021) -consistent with radiation changes in the bladder -no reports of gross heme -UA ***  2. Left hydronephrosis -initially managed w/ left ureteral stent but discontinued due to continuing hematuria -managed with nephrostomy tube last exchanged (03/27/2022) -scheduled for nephrostomy tube exchange (05/22/2022)   3. Nephrolithiasis -stone composition -emergent left stent placement for 2 mm distal stone (09/2021) -stone spontaneously passed after stent placed  4. rUTI's -contributing factors of age, vaginal atrophy, nephrostomy tube, diabetes,  -Documented urine cultures over the last year  March 26, 2022 multiple species  March 13, 2022 Proteus penneri  February 25, 2022 multiple species  February 24, 2022 multiple species  December 27, 2021 Enterococcus faecium and yeast  December 24, 2020 Enterococcus faecium  Nov 16, 2021 no growth  October 17, 2021 no growth  October 08, 2021 multiple species  September 11, 2021 no growth  September 11, 2021 Proteus Mirabilis  August 14, 2021 multiple species -Vitamin C ***   No chief complaint on file.   HPI: Courtney Mack is a 66 y.o. female who presents today for one month follow up after hospitalization.    She was discharged on March 29, 2022 after being treated for complicated UTI, iron deficient anemia, AKI and chronic lower extremities lymphedema with wounds.     PMH: Past Medical History:  Diagnosis Date   Acute bilateral deep vein thrombosis (DVT) of femoral veins ([place]) 01/02/2021   Acute massive pulmonary embolism ([place]) 08/25/2020   Last Assessment & Plan:  Formatting of this note  might be different from the original. Post vena caval filter and on xarelto and O2   Anemia    ARF (acute respiratory failure) ([place])    Cellulitis of left lower leg 06/20/2021   CHF (congestive heart failure) ([place])    COVID-19    Diabetes mellitus without complication ([place])    DVT (deep venous thrombosis) ([place]) 09/06/2020   Endometrial cancer (HCC)    GAVE (gastric antral vascular ectasia)    GERD (gastroesophageal reflux disease)    High cholesterol    Hx of blood clots    Hyperlipidemia    Hypertension    IDA (iron deficiency anemia) 09/21/2020   Obesity    Pressure injury of skin 06/21/2021   Pulmonary embolism (HCC)    Sepsis ([place])    Thrombocytopenia (HCC)    Urinary tract infection 09/13/2021    Surgical History: Past Surgical History:  Procedure Laterality Date   COLONOSCOPY N/A 06/21/2021   Procedure: COLONOSCOPY;  Surgeon: Toledo, Benay Pike, MD;  Location: [facility] ENDOSCOPY;  Service: Gastroenterology;  Laterality: N/A;   CYSTOSCOPY W/ URETERAL STENT PLACEMENT Left 09/11/2021   Procedure: CYSTOSCOPY WITH RETROGRADE PYELOGRAM/URETERAL STENT PLACEMENT;  Surgeon: Abbie Sons, MD;  Location: [facility] ORS;  Service: Urology;  Laterality: Left;   CYSTOSCOPY W/ URETERAL STENT PLACEMENT Left 11/17/2021   Procedure: CYSTOSCOPY WITH STENT REPLACEMENT;  Surgeon: Irine Seal, MD;  Location: [facility] ORS;  Service: Urology;  Laterality: Left;   CYSTOSCOPY WITH FULGERATION  11/17/2021   Procedure: CYSTOSCOPY WITH FULGERATION;  Surgeon: Irine Seal, MD;  Location: [facility] ORS;  Service: Urology;;   CYSTOSCOPY WITH STENT PLACEMENT Left 10/17/2021   Procedure: CYSTOSCOPY WITH  STENT PLACEMENT;  Surgeon: Abbie Sons, MD;  Location: [facility] ORS;  Service: Urology;  Laterality: Left;   CYSTOSCOPY WITH URETEROSCOPY, STONE BASKETRY AND STENT PLACEMENT Left 10/16/2021   Procedure: CYSTOSCOPY WITH URETEROSCOPY  AND STENT REMOVAL;  Surgeon: Abbie Sons, MD;  Location: [facility] ORS;  Service: Urology;   Laterality: Left;   ENTEROSCOPY N/A 08/17/2021   Procedure: ENTEROSCOPY;  Surgeon: Lin Landsman, MD;  Location: [facility] ENDOSCOPY;  Service: Gastroenterology;  Laterality: N/A;   ESOPHAGOGASTRODUODENOSCOPY N/A 06/21/2021   Procedure: ESOPHAGOGASTRODUODENOSCOPY (EGD);  Surgeon: Toledo, Benay Pike, MD;  Location: [facility] ENDOSCOPY;  Service: Gastroenterology;  Laterality: N/A;   ESOPHAGOGASTRODUODENOSCOPY (EGD) WITH PROPOFOL N/A 08/17/2021   Procedure: ESOPHAGOGASTRODUODENOSCOPY (EGD) WITH PROPOFOL;  Surgeon: Lin Landsman, MD;  Location: [facility] ENDOSCOPY;  Service: Gastroenterology;  Laterality: N/A;   ESOPHAGOGASTRODUODENOSCOPY (EGD) WITH PROPOFOL N/A 10/14/2021   Procedure: ESOPHAGOGASTRODUODENOSCOPY (EGD) WITH PROPOFOL;  Surgeon: [name], MD;  Location: [facility] ENDOSCOPY;  Service: Endoscopy;  Laterality: N/A;   ESOPHAGOGASTRODUODENOSCOPY (EGD) WITH PROPOFOL N/A 02/01/2022   Procedure: ESOPHAGOGASTRODUODENOSCOPY (EGD) WITH PROPOFOL;  Surgeon: Lin Landsman, MD;  Location: [facility] ENDOSCOPY;  Service: Gastroenterology;  Laterality: N/A;   ESOPHAGOGASTRODUODENOSCOPY (EGD) WITH PROPOFOL N/A 03/15/2022   Procedure: ESOPHAGOGASTRODUODENOSCOPY (EGD) WITH PROPOFOL;  Surgeon: Jonathon Bellows, MD;  Location: [facility] ENDOSCOPY;  Service: Gastroenterology;  Laterality: N/A;   GIVENS CAPSULE STUDY N/A 06/22/2021   Procedure: GIVENS CAPSULE STUDY;  Surgeon: Toledo, Benay Pike, MD;  Location: [facility] ENDOSCOPY;  Service: Gastroenterology;  Laterality: N/A;   IR NEPHROSTOMY EXCHANGE LEFT  02/25/2022   IR NEPHROSTOMY EXCHANGE LEFT  03/27/2022   IR NEPHROSTOMY PLACEMENT LEFT  12/27/2021   IVC FILTER INSERTION N/A 08/18/2020   Procedure: IVC FILTER INSERTION;  Surgeon: Katha Cabal, MD;  Location: [facility] CV LAB;  Service: Cardiovascular;  Laterality: N/A;   PERIPHERAL VASCULAR THROMBECTOMY Bilateral 01/03/2021   Procedure: PERIPHERAL VASCULAR THROMBECTOMY;  Surgeon: Algernon Huxley, MD;  Location: [place] CV  LAB;  Service: Cardiovascular;  Laterality: Bilateral;   PULMONARY THROMBECTOMY N/A 08/18/2020   Procedure: PULMONARY THROMBECTOMY;  Surgeon: Katha Cabal, MD;  Location: [facility] CV LAB;  Service: Cardiovascular;  Laterality: N/A;   TUBAL LIGATION     VISCERAL ANGIOGRAPHY N/A 07/18/2021   Procedure: VISCERAL ANGIOGRAPHY;  Surgeon: Algernon Huxley, MD;  Location: [place] CV LAB;  Service: Cardiovascular;  Laterality: N/A;    Home Medications:  Allergies as of 04/16/2022   No Known Allergies      Medication List        Accurate as of April 15, 2022 10:17 AM. If you have any questions, ask your nurse or doctor.          acetaminophen 500 MG tablet Commonly known as: TYLENOL Take 500 mg by mouth every 6 (six) hours as needed for mild pain or fever.   cyanocobalamin 1000 MCG tablet Take 1 tablet (1,000 mcg total) by mouth daily.   feeding supplement Liqd Take 237 mLs by mouth 3 (three) times daily between meals.   ferrous sulfate 325 (65 FE) MG tablet Take 1 tablet (325 mg total) by mouth 2 (two) times daily with a meal.   fluticasone furoate-vilanterol 100-25 MCG/ACT Aepb Commonly known as: BREO ELLIPTA Inhale 1 puff into the lungs daily.   furosemide 20 MG tablet Commonly known as: Lasix Hold due to low blood pressure. What changed:  how much to take how to take this when to take this additional instructions   mometasone-formoterol  100-5 MCG/ACT Aero Commonly known as: DULERA Inhale 2 puffs into the lungs 2 (two) times daily.   multivitamin with minerals Tabs tablet Take 1 tablet by mouth daily.   pantoprazole 40 MG tablet Commonly known as: PROTONIX Take 1 tablet (40 mg total) by mouth 2 (two) times daily.   polyethylene glycol 17 g packet Commonly known as: MIRALAX / GLYCOLAX Take 17 g by mouth daily as needed for mild constipation.   rivaroxaban 20 MG Tabs tablet Commonly known as: XARELTO Take 1 tablet (20 mg total) by mouth daily.    rosuvastatin 10 MG tablet Commonly known as: CRESTOR Take 1 tablet (10 mg total) by mouth daily. What changed: when to take this   vitamin C 1000 MG tablet Take 1,000 mg by mouth daily.        Allergies: No Known Allergies  Family History: Family History  Problem Relation Age of Onset   Hypertension Mother    Diabetes Mother    Diabetes Father    Hypertension Father    High Cholesterol Father    Congestive Heart Failure Father    Breast cancer Cousin     Social History:  reports that she has never smoked. She has never used smokeless tobacco. She reports that she does not currently use alcohol. She reports that she does not currently use drugs.  ROS: Pertinent ROS in HPI  Physical Exam: There were no vitals taken for this visit.  Constitutional:  Well nourished. Alert and oriented, No acute distress. HEENT: [place] AT, moist mucus membranes.  Trachea midline, no masses. Cardiovascular: No clubbing, cyanosis, or edema. Respiratory: Normal respiratory effort, no increased work of breathing. GU: No CVA tenderness.  No bladder fullness or masses. Vulvovaginal atrophy w/ pallor, loss of rugae, introital retraction, excoriations.  Vulvar thinning, fusion of labia, clitoral hood retraction, prominent urethral meatus.   *** external genitalia, *** pubic hair distribution, no lesions.  Normal urethral meatus, no lesions, no prolapse, no discharge.   No urethral masses, tenderness and/or tenderness. No bladder fullness, tenderness or masses. *** vagina mucosa, *** estrogen effect, no discharge, no lesions, *** pelvic support, *** cystocele and *** rectocele noted.  No cervical motion tenderness.  Uterus is freely mobile and non-fixed.  No adnexal/parametria masses or tenderness noted.  Anus and perineum are without rashes or lesions.   ***  Neurologic: Grossly intact, no focal deficits, moving all 4 extremities. Psychiatric: Normal mood and affect.    Laboratory Data: Lab Results   Component Value Date   WBC 4.2 03/29/2022   HGB 7.7 (L) 03/29/2022   HCT 24.8 (L) 03/29/2022   MCV 81.3 03/29/2022   PLT 214 03/29/2022    Lab Results  Component Value Date   CREATININE 0.93 03/29/2022    Lab Results  Component Value Date   HGBA1C 5.7 (H) 11/16/2021    Lab Results  Component Value Date   AST 24 03/26/2022   Lab Results  Component Value Date   ALT 15 03/26/2022    Urinalysis ***  I have reviewed the labs.   Pertinent Imaging: ***  Assessment & Plan:  ***  1. High risk hematuria -multiple CT's and recent cysto (11/2021) saw changes consistent with radiation  -no reports of gross heme ***  2. rUTI's. - criteria for recurrent UTI has been met with 2 or more infections in 6 months or 3 or greater infections in one year  -Not a candidate for vaginal estrogen cream due to history of endometrial cancer  -  patient is instructed to increase their water intake until the urine is pale yellow or clear (10 to 12 cups daily) ***  - patient is instructed to take probiotics (yogurt, oral pills or vaginal suppositories), take cranberry pills or drink the juice and Vitamin C 1,000 mg daily to acidify the urine ***  - if using tampons, she should remove them prior to urinating and change them often ***  - avoid soaking in tubs and wipe front to back after urinating ***  - benefit from core strengthening exercises has been seen.  We can refer to PT if they desire ***  - advised them to have CATH UA's for urinalysis and culture to prevent skin, vaginal and/or rectal contamination of the specimen  - reviewed symptoms of UTI (fevers, chills, gross hematuria, mental status changes, dysuria, suprapubic pain, back pain and/or sudden worsening of urinary symptoms) and advised not to have urine checked or be treated for UTI if not experiencing symptoms  - discussed antibiotic stewardship with the patient - explained the risk of increasing risk of antibiotic resistance with  continuous exposure to antibiotics, renal failure, hypoglycemia, C. Diff infection, allergic reactions, etc.    3. Left hydronephrosis *** -Managed with nephrostomy tube and scheduled for exchange on May 18, 2022                                               No follow-ups on file.  These notes generated with voice recognition software. I apologize for typographical errors.  [place], [place] [address]  [place], [place] [address] [phone]

## 2022-04-16 ENCOUNTER — Ambulatory Visit: Payer: Medicaid Other | Admitting: Urology

## 2022-04-16 ENCOUNTER — Inpatient Hospital Stay
Admission: EM | Admit: 2022-04-16 | Discharge: 2022-04-20 | DRG: 378 | Disposition: A | Discharge status: Home / Self Care | Payer: Medicare HMO | Attending: Hospitalist | Admitting: Hospitalist

## 2022-04-16 ENCOUNTER — Other Ambulatory Visit: Payer: Self-pay

## 2022-04-16 DIAGNOSIS — E669 Obesity, unspecified: Secondary | ICD-10-CM | POA: Diagnosis present

## 2022-04-16 DIAGNOSIS — K31819 Angiodysplasia of stomach and duodenum without bleeding: Secondary | ICD-10-CM | POA: Diagnosis present

## 2022-04-16 DIAGNOSIS — Z8542 Personal history of malignant neoplasm of other parts of uterus: Secondary | ICD-10-CM

## 2022-04-16 DIAGNOSIS — Z7901 Long term (current) use of anticoagulants: Secondary | ICD-10-CM

## 2022-04-16 DIAGNOSIS — E119 Type 2 diabetes mellitus without complications: Secondary | ICD-10-CM | POA: Diagnosis present

## 2022-04-16 DIAGNOSIS — E876 Hypokalemia: Secondary | ICD-10-CM | POA: Diagnosis present

## 2022-04-16 DIAGNOSIS — I959 Hypotension, unspecified: Secondary | ICD-10-CM | POA: Diagnosis present

## 2022-04-16 DIAGNOSIS — Z936 Other artificial openings of urinary tract status: Secondary | ICD-10-CM | POA: Diagnosis not present

## 2022-04-16 DIAGNOSIS — D62 Acute posthemorrhagic anemia: Secondary | ICD-10-CM | POA: Diagnosis present

## 2022-04-16 DIAGNOSIS — Z6838 Body mass index (BMI) 38.0-38.9, adult: Secondary | ICD-10-CM | POA: Diagnosis not present

## 2022-04-16 DIAGNOSIS — E7800 Pure hypercholesterolemia, unspecified: Secondary | ICD-10-CM | POA: Diagnosis present

## 2022-04-16 DIAGNOSIS — Z83438 Family history of other disorder of lipoprotein metabolism and other lipidemia: Secondary | ICD-10-CM | POA: Diagnosis not present

## 2022-04-16 DIAGNOSIS — K219 Gastro-esophageal reflux disease without esophagitis: Secondary | ICD-10-CM | POA: Diagnosis present

## 2022-04-16 DIAGNOSIS — Z960 Presence of urogenital implants: Secondary | ICD-10-CM | POA: Diagnosis present

## 2022-04-16 DIAGNOSIS — Z8616 Personal history of COVID-19: Secondary | ICD-10-CM

## 2022-04-16 DIAGNOSIS — I5032 Chronic diastolic (congestive) heart failure: Secondary | ICD-10-CM | POA: Diagnosis present

## 2022-04-16 DIAGNOSIS — Z79899 Other long term (current) drug therapy: Secondary | ICD-10-CM | POA: Diagnosis not present

## 2022-04-16 DIAGNOSIS — I82502 Chronic embolism and thrombosis of unspecified deep veins of left lower extremity: Secondary | ICD-10-CM | POA: Diagnosis present

## 2022-04-16 DIAGNOSIS — K921 Melena: Secondary | ICD-10-CM | POA: Diagnosis not present

## 2022-04-16 DIAGNOSIS — Z833 Family history of diabetes mellitus: Secondary | ICD-10-CM

## 2022-04-16 DIAGNOSIS — Z86718 Personal history of other venous thrombosis and embolism: Secondary | ICD-10-CM | POA: Diagnosis not present

## 2022-04-16 DIAGNOSIS — I110 Hypertensive heart disease with heart failure: Secondary | ICD-10-CM | POA: Diagnosis present

## 2022-04-16 DIAGNOSIS — Z95828 Presence of other vascular implants and grafts: Secondary | ICD-10-CM | POA: Diagnosis not present

## 2022-04-16 DIAGNOSIS — Z86711 Personal history of pulmonary embolism: Secondary | ICD-10-CM | POA: Diagnosis not present

## 2022-04-16 DIAGNOSIS — N1330 Unspecified hydronephrosis: Secondary | ICD-10-CM

## 2022-04-16 DIAGNOSIS — N390 Urinary tract infection, site not specified: Secondary | ICD-10-CM

## 2022-04-16 DIAGNOSIS — K31811 Angiodysplasia of stomach and duodenum with bleeding: Principal | ICD-10-CM | POA: Diagnosis present

## 2022-04-16 DIAGNOSIS — Z8249 Family history of ischemic heart disease and other diseases of the circulatory system: Secondary | ICD-10-CM

## 2022-04-16 DIAGNOSIS — N131 Hydronephrosis with ureteral stricture, not elsewhere classified: Secondary | ICD-10-CM | POA: Diagnosis present

## 2022-04-16 DIAGNOSIS — R319 Hematuria, unspecified: Secondary | ICD-10-CM

## 2022-04-16 DIAGNOSIS — A419 Sepsis, unspecified organism: Secondary | ICD-10-CM | POA: Diagnosis present

## 2022-04-16 DIAGNOSIS — Z7951 Long term (current) use of inhaled steroids: Secondary | ICD-10-CM

## 2022-04-16 LAB — CBC
HCT: 16.7 % — ABNORMAL LOW (ref 36.0–46.0)
Hemoglobin: 4.8 g/dL — CL (ref 12.0–15.0)
MCH: 27.7 pg (ref 26.0–34.0)
MCHC: 28.7 g/dL — ABNORMAL LOW (ref 30.0–36.0)
MCV: 96.5 fL (ref 80.0–100.0)
Platelets: 190 10*3/uL (ref 150–400)
RBC: 1.73 MIL/uL — ABNORMAL LOW (ref 3.87–5.11)
WBC: 4.8 10*3/uL (ref 4.0–10.5)
nRBC: 0 % (ref 0.0–0.2)

## 2022-04-16 LAB — BASIC METABOLIC PANEL
Anion gap: 9 (ref 5–15)
BUN: 22 mg/dL (ref 8–23)
CO2: 25 mmol/L (ref 22–32)
Calcium: 8.3 mg/dL — ABNORMAL LOW (ref 8.9–10.3)
Chloride: 105 mmol/L (ref 98–111)
Creatinine, Ser: 0.83 mg/dL (ref 0.44–1.00)
GFR, Estimated: >60 mL/min (ref >60)
Glucose, Bld: 147 mg/dL — ABNORMAL HIGH (ref 70–99)
Potassium: 2.9 mmol/L — ABNORMAL LOW (ref 3.5–5.1)
Sodium: 139 mmol/L (ref 135–145)

## 2022-04-16 LAB — IRON AND TIBC
Iron: 21 ug/dL — ABNORMAL LOW (ref 28–170)
Saturation Ratios: 7 % — ABNORMAL LOW (ref 10.4–31.8)
TIBC: 322 ug/dL (ref 250–450)
UIBC: 301 ug/dL

## 2022-04-16 LAB — URINALYSIS, ROUTINE W REFLEX MICROSCOPIC
Bilirubin Urine: NEGATIVE
Glucose, UA: NEGATIVE mg/dL
Hgb urine dipstick: NEGATIVE
Ketones, ur: NEGATIVE mg/dL
Nitrite: NEGATIVE
Protein, ur: 100 mg/dL — AB
Specific Gravity, Urine: 1.013 (ref 1.005–1.030)
WBC, UA: >50 WBC/hpf — ABNORMAL HIGH (ref 0–5)
pH: 6 (ref 5.0–8.0)

## 2022-04-16 LAB — RETICULOCYTES
Immature Retic Fract: 33.8 % — ABNORMAL HIGH (ref 2.3–15.9)
RBC.: 1.71 MIL/uL — ABNORMAL LOW (ref 3.87–5.11)
Retic Count, Absolute: 178.7 10*3/uL (ref 19.0–186.0)
Retic Ct Pct: 10.5 % — ABNORMAL HIGH (ref 0.4–3.1)

## 2022-04-16 LAB — MAGNESIUM: Magnesium: 1.7 mg/dL (ref 1.7–2.4)

## 2022-04-16 LAB — HEMOGLOBIN AND HEMATOCRIT, BLOOD
HCT: 25.1 % — ABNORMAL LOW (ref 36.0–46.0)
Hemoglobin: 7.3 g/dL — ABNORMAL LOW (ref 12.0–15.0)

## 2022-04-16 LAB — FOLATE: Folate: 29 ng/mL (ref >5.9)

## 2022-04-16 LAB — PREPARE RBC (CROSSMATCH)

## 2022-04-16 LAB — FERRITIN: Ferritin: 12 ng/mL (ref 11–307)

## 2022-04-16 LAB — VITAMIN B12: Vitamin B-12: 451 pg/mL (ref 180–914)

## 2022-04-16 MED ORDER — FLUTICASONE FUROATE-VILANTEROL 100-25 MCG/ACT IN AEPB
1.0000 | INHALATION_SPRAY | Freq: Every day | RESPIRATORY_TRACT | Status: DC
  Administered 2022-04-18 – 2022-04-20 (×3): 1 via RESPIRATORY_TRACT
  Filled 2022-04-16: qty 28

## 2022-04-16 MED ORDER — SODIUM CHLORIDE 0.9 % IV SOLN
2.0000 g | Freq: Two times a day (BID) | INTRAVENOUS | Status: DC
  Administered 2022-04-16 – 2022-04-18 (×4): 2 g via INTRAVENOUS
  Filled 2022-04-16 (×3): qty 12.5
  Filled 2022-04-16: qty 2

## 2022-04-16 MED ORDER — ONDANSETRON HCL 4 MG/2ML IJ SOLN: 4.0000 mg | Freq: Four times a day (QID) | INTRAMUSCULAR | Status: DC | PRN

## 2022-04-16 MED ORDER — ACETAMINOPHEN 650 MG RE SUPP: 650.0000 mg | Freq: Four times a day (QID) | RECTAL | Status: DC | PRN

## 2022-04-16 MED ORDER — ACETAMINOPHEN 325 MG PO TABS
650.0000 mg | ORAL_TABLET | Freq: Four times a day (QID) | ORAL | Status: DC | PRN
  Administered 2022-04-19: 650 mg via ORAL
  Filled 2022-04-16: qty 2

## 2022-04-16 MED ORDER — SODIUM CHLORIDE 0.9 % IV BOLUS
1000.0000 mL | Freq: Once | INTRAVENOUS | Status: AC
  Administered 2022-04-16: 1000 mL via INTRAVENOUS

## 2022-04-16 MED ORDER — SODIUM CHLORIDE 0.9 % IV SOLN
2.0000 g | Freq: Once | INTRAVENOUS | Status: AC
  Administered 2022-04-16: 2 g via INTRAVENOUS
  Filled 2022-04-16: qty 20

## 2022-04-16 MED ORDER — SODIUM CHLORIDE 0.9 % IV SOLN: 10.0000 mL/h | Freq: Once | INTRAVENOUS | Status: AC

## 2022-04-16 MED ORDER — ONDANSETRON HCL 4 MG PO TABS: 4.0000 mg | ORAL_TABLET | Freq: Four times a day (QID) | ORAL | Status: DC | PRN

## 2022-04-16 MED ORDER — PANTOPRAZOLE INFUSION (NEW) - SIMPLE MED
8.0000 mg/h | INTRAVENOUS | Status: DC
  Administered 2022-04-17: 8 mg/h via INTRAVENOUS
  Filled 2022-04-16 (×3): qty 100

## 2022-04-16 MED ORDER — POTASSIUM CHLORIDE 20 MEQ PO PACK
40.0000 meq | PACK | Freq: Two times a day (BID) | ORAL | Status: DC
  Administered 2022-04-16 – 2022-04-19 (×5): 40 meq via ORAL
  Filled 2022-04-16 (×5): qty 2

## 2022-04-16 MED ORDER — SODIUM CHLORIDE 0.9 % IV SOLN: INTRAVENOUS | Status: DC

## 2022-04-16 MED ORDER — PANTOPRAZOLE SODIUM 40 MG IV SOLR
40.0000 mg | Freq: Once | INTRAVENOUS | Status: AC
  Administered 2022-04-16: 40 mg via INTRAVENOUS
  Filled 2022-04-16: qty 10

## 2022-04-16 NOTE — Assessment & Plan Note (Addendum)
Secondary to ABLA, with some concern for sepsis Treat acute blood loss anemia as outlined --d/c'ed MIVF on 10/12 and encourage oral hydration --cont midodrine 10 mg TID (new)

## 2022-04-16 NOTE — ED Provider Notes (Signed)
[facility] Provider Note    Event Date/Time   First MD Initiated Contact with Patient 04/16/22 1729     (approximate)   History   Weakness   HPI  Courtney Mack is a 66 y.o. female here with generalized weakness.  The patient presents with her daughter.  Per report, she has been increasingly weak over the last several days.  She has been more pale.  She has had difficulty getting around today and has felt very lightheaded with any kind of movement.  She had some mild shortness of breath even at rest.  She has noticed some black stool.  She has a history of anemia and previously required transfusions during her recent stay for her UTI.  She denies any fevers.  She has had decreased appetite but this is not necessarily new.  No significant abdominal pain.     Physical Exam   Triage Vital Signs: ED Triage Vitals  Enc Vitals Group     BP 04/16/22 1434 (!) 106/45     Pulse Rate 04/16/22 1434 95     Resp 04/16/22 1434 20     Temp 04/16/22 1434 97.6 F (36.4 C)     Temp Source 04/16/22 1434 Oral     SpO2 04/16/22 1434 95 %     Weight 04/16/22 1426 173 lb 1 oz (78.5 kg)     Height 04/16/22 1426 '4\' 11"'$  (1.499 m)     Head Circumference --      Peak Flow --      Pain Score 04/16/22 1426 0     Pain Loc --      Pain Edu? --      Excl. in [place]? --     Most recent vital signs: Vitals:   04/16/22 1945 04/16/22 2030  BP: (!) 108/51 (!) 119/56  Pulse: 86 89  Resp: 17 (!) 31  Temp: 100.3 F (37.9 C) 98.9 F (37.2 C)  SpO2:  99%     General: Awake, no distress.  CV:  Good peripheral perfusion.  Resp:  Normal effort.  Mild tachypnea noted.  Lungs clear. Abd:  No distention.  No tenderness.  Nephrostomy tube in place draining yellow, clear, nonbloody urine. Other:  No overt external bleeding.   ED Results / Procedures / Treatments   Labs (all labs ordered are listed, but only abnormal results are displayed) Labs Reviewed  BASIC METABOLIC PANEL  - Abnormal; Notable for the following components:      Result Value   Potassium 2.9 (*)    Glucose, Bld 147 (*)    Calcium 8.3 (*)    All other components within normal limits  CBC - Abnormal; Notable for the following components:   RBC 1.73 (*)    Hemoglobin 4.8 (*)    HCT 16.7 (*)    MCHC 28.7 (*)    All other components within normal limits  URINALYSIS, ROUTINE W REFLEX MICROSCOPIC - Abnormal; Notable for the following components:   Color, Urine YELLOW (*)    APPearance CLOUDY (*)    Protein, ur 100 (*)    Leukocytes,Ua LARGE (*)    WBC, UA >50 (*)    Bacteria, UA RARE (*)    All other components within normal limits  IRON AND TIBC - Abnormal; Notable for the following components:   Iron 21 (*)    Saturation Ratios 7 (*)    All other components within normal limits  RETICULOCYTES - Abnormal; Notable for  the following components:   Retic Ct Pct 10.5 (*)    RBC. 1.71 (*)    Immature Retic Fract 33.8 (*)    All other components within normal limits  CULTURE, BLOOD (ROUTINE X 2)  CULTURE, BLOOD (ROUTINE X 2)  URINE CULTURE  FOLATE  FERRITIN  MAGNESIUM  VITAMIN B12  CBG MONITORING, ED  TYPE AND SCREEN  PREPARE RBC (CROSSMATCH)     EKG Normal sinus rhythm, ventricular rate 96.  PR 168, QRS 60, QTc 472.  No acute ST elevation or depression.  No acute events of acute ischemia or infarct.   RADIOLOGY    I also independently reviewed and agree with radiologist interpretations.   PROCEDURES:  Critical Care performed: Yes, see critical care procedure note(s)  .Critical Care  Performed by: Duffy Bruce, MD Authorized by: Duffy Bruce, MD   Critical care provider statement:    Critical care time (minutes):  30   Critical care time was exclusive of:  Separately billable procedures and treating other patients   Critical care was necessary to treat or prevent imminent or life-threatening deterioration of the following conditions:  Cardiac failure, circulatory  failure and respiratory failure   Critical care was time spent personally by me on the following activities:  Development of treatment plan with patient or surrogate, discussions with consultants, evaluation of patient's response to treatment, examination of patient, ordering and review of laboratory studies, ordering and review of radiographic studies, ordering and performing treatments and interventions, pulse oximetry, re-evaluation of patient's condition and review of old charts     MEDICATIONS ORDERED IN ED: Medications  0.9 %  sodium chloride infusion (0 mL/hr Intravenous Hold 04/16/22 1758)  potassium chloride (KLOR-CON) packet 40 mEq (has no administration in time range)  cefTRIAXone (ROCEPHIN) 2 g in sodium chloride 0.9 % 100 mL IVPB (2 g Intravenous New Bag/Given 04/16/22 2016)  sodium chloride 0.9 % bolus 1,000 mL (1,000 mLs Intravenous New Bag/Given 04/16/22 2016)     IMPRESSION / MDM / [place] / ED COURSE  I reviewed the triage vital signs and the nursing notes.                               The patient is on the cardiac monitor to evaluate for evidence of arrhythmia and/or significant heart rate changes.   Ddx:  Differential includes the following, with pertinent life- or limb-threatening emergencies considered:  Anemia, recurrent UTI, sepsis, CHF, ACS, polypharmacy, deconditioning, occult PNA  Patient's presentation is most consistent with acute presentation with potential threat to life or bodily function.  MDM:  66 yo F with PMhx HTN, HLD, GERD, here with generalized weakness. Pt was just admitted for complicated UTI with nephrostomy tube placement. On arrival here, pt is borderline hypotensive, ill-appearing. Labs show significant acute on chronic anemia with Hgb 4.8. Likely GI loss related - h/o AVM in stomach that was recently lasered. Pt K 2.9, BMP o/w unremarkable. Mag normal. UA shows possible ongoing UTI and she has a temp of 100.3 here. Abdomen  soft, no signs to suggest perf, obstruction.   Will start on empiric ABX, transfuse for acute on chronic blood loss anemia, and admit to medicine.   MEDICATIONS GIVEN IN ED: Medications  0.9 %  sodium chloride infusion (0 mL/hr Intravenous Hold 04/16/22 1758)  potassium chloride (KLOR-CON) packet 40 mEq (has no administration in time range)  cefTRIAXone (ROCEPHIN) 2 g in sodium chloride  0.9 % 100 mL IVPB (2 g Intravenous New Bag/Given 04/16/22 2016)  sodium chloride 0.9 % bolus 1,000 mL (1,000 mLs Intravenous New Bag/Given 04/16/22 2016)     Consults:  Hospitalist   EMR reviewed  Reviewed priro H&P and DC records Reviewed EGD from 03/15/2022 with Dr. Vicente Males, moderate gatric antral vascular ectasia noted     FINAL CLINICAL IMPRESSION(S) / ED DIAGNOSES   Final diagnoses:  Acute on chronic blood loss anemia  Hypokalemia  Complicated UTI (urinary tract infection)     Rx / DC Orders   ED Discharge Orders     None        Note:  This document was prepared using Dragon voice recognition software and may include unintentional dictation errors.   Duffy Bruce, MD 04/16/22 [phone]

## 2022-04-16 NOTE — ED Provider Triage Note (Signed)
Emergency Medicine Provider Triage Evaluation Note  Courtney Mack, a 66 y.o. female  was evaluated in triage.  Pt complains of weakness and low hemoglobin.  Patient with a history of GI bleed, diabetes, hypertension, CHF, thrombocytopenia, presents to the ED.  She notes increased weakness with some fluid retention in her lower legs.  Patient with history of the same, including blood transfusion.  She would endorse some dark stools.  Review of Systems  Positive: Weakness, anemia Negative: FCS, NVD  Physical Exam  BP (!) 106/45 (BP Location: Left Arm)   Pulse 95   Temp 97.6 F (36.4 C) (Oral)   Resp 20   Ht '4\' 11"'$  (1.499 m)   Wt 78.5 kg   SpO2 95%   BMI 34.95 kg/m  Gen:   Awake, no distress  NAD Resp:  Normal effort CTA MSK:   Moves extremities without difficulty BLE lymphedema Other:  RRR  Medical Decision Making  Medically screening exam initiated at 4:58 PM.  Appropriate orders placed.  Courtney Mack was informed that the remainder of the evaluation will be completed by another provider, this initial triage assessment does not replace that evaluation, and the importance of remaining in the ED until their evaluation is complete.  Geriatric patient to the ED for evaluation of generalized weakness and some increase lower extremity edema.  Patient with a history of GI bleed and blood transfusion, presents with symptoms concerning for the same.   Courtney Needles, PA-C 04/16/22 1700

## 2022-04-16 NOTE — Consult Note (Signed)
Pharmacy Antibiotic Note  Courtney Mack is a 66 y.o. female w/ h/o HTN, HLD, GERD, here with generalized weakness. Pt was just admitted for complicated UTI associated with nephrostomy tube placement and on arrival here, was borderline hypotensive, ill-appearing with temperature of 100.58F now admitted on 04/16/2022 with UTI.  Pharmacy has been consulted for cefepime dosing.  Plan: Initiate cefepime 2g IV q12h x7 days  (adjusted per current renal fxn but borderline. Follow up AM labs)  Height: '4\' 11"'$  (149.9 cm) Weight: 78.5 kg (173 lb 1 oz) IBW/kg (Calculated) : 43.2  Temp (24hrs), Avg:98.5 F (36.9 C), Min:97.6 F (36.4 C), Max:100.3 F (37.9 C)  Recent Labs  Lab 04/16/22 1428  WBC 4.8  CREATININE 0.83    Estimated Creatinine Clearance: 59.5 mL/min (by C-G formula based on SCr of 0.83 mg/dL).    No Known Allergies  Antimicrobials this admission: CRO x1 in ED  (10/10) CFP (10/10 >> 10/17)  Dose adjustments this admission: CTM and adjust PRN.   Microbiology results: 10/10 BCx: pending 10/10 UCx: pending   Thank you for allowing pharmacy to be a part of this patient's care.  Shanon Brow Vonn Sliger 04/16/2022 11:04 PM

## 2022-04-16 NOTE — ED Triage Notes (Signed)
Pt here with weakness and fluid in her legs. Family states when this happens her hgb is normally low. Pt is on blood thinner.   88 98% RA 110/70---86/48 98.6 212-cbg 18G RAC

## 2022-04-16 NOTE — Assessment & Plan Note (Signed)
History of acute on chronic DVT left leg 02/24/2022 Holding Xarelto

## 2022-04-16 NOTE — Assessment & Plan Note (Addendum)
[place] Gastric antral vascular ectasia with recurrent bleeding EGD performed on 03/15/2022 Gastric antral vascular ectasia without bleeding. Treated with argon plasma coagulation (APC). Recommendation to consider cryotherapy for recurrent bleeding from GAVE  --Hgb 4.8 on presentation.  S/p 3u pRBC so far --EGD on 10/12 found Gastric antral vascular ectasia without bleeding. Treated with argon plasma coagulation (APC). Plan: --hold Xarelto --cont oral PPI BID  --consider Refer to main [facility] for cryotherapy of GAVE

## 2022-04-16 NOTE — Assessment & Plan Note (Deleted)
Bilateral hydronephrosis secondary to ureteral stricture s/p left nephrostomy History of MDR Proteus UTI 9/6 S/p nephrostomy exchange on 9/20.  Next exchange scheduled for 11/15 Urinalysis concerning for UTI, though pt denied dysuria during this hospitalization. --started on cefepime Plan: --cont cefepime for now pending urine cx

## 2022-04-16 NOTE — H&P (Signed)
History and Physical    Patient: Courtney Mack [id number]:[id number] DOB: 12/25/1955 DOA: 04/16/2022 DOS: the patient was seen and examined on 04/16/2022 PCP: [facility] [place]  Patient coming from: Home  Chief Complaint:  Chief Complaint  Patient presents with   Weakness    HPI: Courtney Mack is a 66 y.o. female with medical history significant for Recurrent DVT and PE on Xarelto s/p IVC filter, HTN, endometrial cancer, radiation cystitis with intermittent hematuria requiring CBI,recurrent septic shock from UTI associated with left nephrostomy tube since placement in 12/27/2021 for ureteral stricture(MDR Proteus on culture 9/6) recurrent GI bleed from GERD/GAVE treated with APC(most recently 9/8) and requiring several blood transfusions,  resumed on Xarelto at most recent discharge on 9/22, with plans for consideration of cryotherapy if recurrent bleeding who presents to the ED with a several day history of generalized weakness, lightheadedness, dyspnea with minimal exertion, and melanotic stool.  She denies abdominal pain, nausea or vomiting. ED course and data review: BP 106/45 on arrival, dropping as low as 79/66, fluid responsive to 124/64 by admission.  Intermittent tachypnea to 25.  Vitals otherwise unremarkable.  Labs significant for hemoglobin of 4.8, potassium 2.9 UA with large leukocyte esterase greater than 50 WBC per hpf and rare bacteria.  Showing NSR at 96. Patient given a 1 L fluid bolus, IV Protonix, Rocephin and hospitalist consulted for admission..   Review of Systems: As mentioned in the history of present illness. All other systems reviewed and are negative.  Past Medical History:  Diagnosis Date   Acute bilateral deep vein thrombosis (DVT) of femoral veins ([place]) 01/02/2021   Acute massive pulmonary embolism ([place]) 08/25/2020   Last Assessment & Plan:  Formatting of this note might be different from the original. Post vena caval filter and on xarelto  and O2   Anemia    ARF (acute respiratory failure) ([place])    Cellulitis of left lower leg 06/20/2021   CHF (congestive heart failure) ([place])    COVID-19    Diabetes mellitus without complication ([place])    DVT (deep venous thrombosis) ([place]) 09/06/2020   Endometrial cancer (HCC)    GAVE (gastric antral vascular ectasia)    GERD (gastroesophageal reflux disease)    High cholesterol    Hx of blood clots    Hyperlipidemia    Hypertension    IDA (iron deficiency anemia) 09/21/2020   Obesity    Pressure injury of skin 06/21/2021   Pulmonary embolism ([place])    Sepsis ([place])    Thrombocytopenia ([place])    Urinary tract infection 09/13/2021   Past Surgical History:  Procedure Laterality Date   COLONOSCOPY N/A 06/21/2021   Procedure: COLONOSCOPY;  Surgeon: Toledo, Benay Pike, MD;  Location: [facility] ENDOSCOPY;  Service: Gastroenterology;  Laterality: N/A;   CYSTOSCOPY W/ URETERAL STENT PLACEMENT Left 09/11/2021   Procedure: CYSTOSCOPY WITH RETROGRADE PYELOGRAM/URETERAL STENT PLACEMENT;  Surgeon: Abbie Sons, MD;  Location: [facility] ORS;  Service: Urology;  Laterality: Left;   CYSTOSCOPY W/ URETERAL STENT PLACEMENT Left 11/17/2021   Procedure: CYSTOSCOPY WITH STENT REPLACEMENT;  Surgeon: Irine Seal, MD;  Location: [facility] ORS;  Service: Urology;  Laterality: Left;   CYSTOSCOPY WITH FULGERATION  11/17/2021   Procedure: CYSTOSCOPY WITH FULGERATION;  Surgeon: Irine Seal, MD;  Location: [facility] ORS;  Service: Urology;;   CYSTOSCOPY WITH STENT PLACEMENT Left 10/17/2021   Procedure: CYSTOSCOPY WITH STENT PLACEMENT;  Surgeon: Abbie Sons, MD;  Location: [facility] ORS;  Service: Urology;  Laterality: Left;  CYSTOSCOPY WITH URETEROSCOPY, STONE BASKETRY AND STENT PLACEMENT Left 10/16/2021   Procedure: CYSTOSCOPY WITH URETEROSCOPY  AND STENT REMOVAL;  Surgeon: Abbie Sons, MD;  Location: [facility] ORS;  Service: Urology;  Laterality: Left;   ENTEROSCOPY N/A 08/17/2021   Procedure: ENTEROSCOPY;  Surgeon: Lin Landsman, MD;  Location: [facility] ENDOSCOPY;  Service: Gastroenterology;  Laterality: N/A;   ESOPHAGOGASTRODUODENOSCOPY N/A 06/21/2021   Procedure: ESOPHAGOGASTRODUODENOSCOPY (EGD);  Surgeon: Toledo, Benay Pike, MD;  Location: [facility] ENDOSCOPY;  Service: Gastroenterology;  Laterality: N/A;   ESOPHAGOGASTRODUODENOSCOPY (EGD) WITH PROPOFOL N/A 08/17/2021   Procedure: ESOPHAGOGASTRODUODENOSCOPY (EGD) WITH PROPOFOL;  Surgeon: Lin Landsman, MD;  Location[facility]COPY;  Service: Gastroenterology;  Laterality: N/A;   ESOPHAGOGASTRODUODENOSCOPY (EGD) WITH PROPOFOL N/A 10/14/2021   Procedure: ESOPHAGOGASTRODUODENOSCOPY (EGD) WITH PROPOFOL;  Surgeon: [name], MD;  Location: [facility] ENDOSCOPY;  Service: Endoscopy;  Laterality: N/A;   ESOPHAGOGASTRODUODENOSCOPY (EGD) WITH PROPOFOL N/A 02/01/2022   Procedure: ESOPHAGOGASTRODUODENOSCOPY (EGD) WITH PROPOFOL;  Surgeon: Lin Landsman, MD;  Location: [facility] ENDOSCOPY;  Service: Gastroenterology;  Laterality: N/A;   ESOPHAGOGASTRODUODENOSCOPY (EGD) WITH PROPOFOL N/A 03/15/2022   Procedure: ESOPHAGOGASTRODUODENOSCOPY (EGD) WITH PROPOFOL;  Surgeon: Jonathon Bellows, MD;  Location: [facility] ENDOSCOPY;  Service: Gastroenterology;  Laterality: N/A;   GIVENS CAPSULE STUDY N/A 06/22/2021   Procedure: GIVENS CAPSULE STUDY;  Surgeon: Toledo, Benay Pike, MD;  Location: [facility] ENDOSCOPY;  Service: Gastroenterology;  Laterality: N/A;   IR NEPHROSTOMY EXCHANGE LEFT  02/25/2022   IR NEPHROSTOMY EXCHANGE LEFT  03/27/2022   IR NEPHROSTOMY PLACEMENT LEFT  12/27/2021   IVC FILTER INSERTION N/A 08/18/2020   Procedure: IVC FILTER INSERTION;  Surgeon: Katha Cabal, MD;  Location: [place] CV LAB;  Service: Cardiovascular;  Laterality: N/A;   PERIPHERAL VASCULAR THROMBECTOMY Bilateral 01/03/2021   Procedure: PERIPHERAL VASCULAR THROMBECTOMY;  Surgeon: Algernon Huxley, MD;  Location: [place] CV LAB;  Service: Cardiovascular;  Laterality: Bilateral;   PULMONARY THROMBECTOMY N/A 08/18/2020    Procedure: PULMONARY THROMBECTOMY;  Surgeon: Katha Cabal, MD;  Location: [place] CV LAB;  Service: Cardiovascular;  Laterality: N/A;   TUBAL LIGATION     VISCERAL ANGIOGRAPHY N/A 07/18/2021   Procedure: VISCERAL ANGIOGRAPHY;  Surgeon: Algernon Huxley, MD;  Location: [place] CV LAB;  Service: Cardiovascular;  Laterality: N/A;   Social History:  reports that she has never smoked. She has never used smokeless tobacco. She reports that she does not currently use alcohol. She reports that she does not currently use drugs.  No Known Allergies  Family History  Problem Relation Age of Onset   Hypertension Mother    Diabetes Mother    Diabetes Father    Hypertension Father    High Cholesterol Father    Congestive Heart Failure Father    Breast cancer Cousin     Prior to Admission medications   Medication Sig Start Date End Date Taking? Authorizing Provider  Ascorbic Acid (VITAMIN C) 1000 MG tablet Take 1,000 mg by mouth daily.   Yes [provider]  cyanocobalamin 1000 MCG tablet Take 1 tablet (1,000 mcg total) by mouth daily. 02/03/22  Yes Fritzi Mandes, MD  ferrous sulfate 325 (65 FE) MG tablet Take 1 tablet (325 mg total) by mouth 2 (two) times daily with a meal. 03/16/22  Yes Lorella Nimrod, MD  fluticasone furoate-vilanterol (BREO ELLIPTA) 100-25 MCG/ACT AEPB Inhale 1 puff into the lungs daily.   Yes [provider]  furosemide (LASIX) 20 MG tablet Hold due to low blood pressure. Patient taking differently:  Take 20 mg by mouth daily. 12/31/21  Yes Enzo Bi, MD  mometasone-formoterol [facility]) 100-5 MCG/ACT AERO Inhale 2 puffs into the lungs 2 (two) times daily.   Yes [provider]  Multiple Vitamin (MULTIVITAMIN WITH MINERALS) TABS tablet Take 1 tablet by mouth daily. 09/12/20  Yes [name], MD  pantoprazole (PROTONIX) 40 MG tablet Take 1 tablet (40 mg total) by mouth 2 (two) times daily. 03/01/22 04/30/22 Yes Sreenath, Sudheer B, MD  rivaroxaban  (XARELTO) 20 MG TABS tablet Take 1 tablet (20 mg total) by mouth daily. 03/29/22 06/27/22 Yes Lavina Hamman, MD  rosuvastatin (CRESTOR) 10 MG tablet Take 1 tablet (10 mg total) by mouth daily. Patient taking differently: Take 10 mg by mouth in the morning. 10/03/20  Yes Earlie Server, MD  acetaminophen (TYLENOL) 500 MG tablet Take 500 mg by mouth every 6 (six) hours as needed for mild pain or fever.    [provider]  feeding supplement (ENSURE ENLIVE / ENSURE PLUS) LIQD Take 237 mLs by mouth 3 (three) times daily between meals. Patient not taking: Reported on 04/11/2022 02/02/22   Fritzi Mandes, MD  polyethylene glycol (MIRALAX / GLYCOLAX) 17 g packet Take 17 g by mouth daily as needed for mild constipation. 03/29/22   Lavina Hamman, MD    Physical Exam: Vitals:   04/16/22 2030 04/16/22 2117 04/16/22 2130 04/16/22 2147  BP: (!) 119/56 (!) 96/51 (!) 113/57 124/64  Pulse: 89 83 83 92  Resp: (!) 31 16 (!) 25 18  Temp: 98.9 F (37.2 C) 98.5 F (36.9 C)  98.8 F (37.1 C)  TempSrc: Oral Oral  Oral  SpO2: 99%  96%   Weight:      Height:       Physical Exam Vitals and nursing note reviewed.  Constitutional:      General: She is sleeping. She is not in acute distress.    Appearance: She is ill-appearing.  HENT:     Head: Normocephalic and atraumatic.  Cardiovascular:     Rate and Rhythm: Normal rate and regular rhythm.     Heart sounds: Normal heart sounds.  Pulmonary:     Effort: Pulmonary effort is normal. Tachypnea present.     Breath sounds: Normal breath sounds.  Abdominal:     Palpations: Abdomen is soft.     Tenderness: There is no abdominal tenderness.  Neurological:     Mental Status: She is easily aroused. Mental status is at baseline.     Labs on Admission: I have personally reviewed following labs and imaging studies  CBC: Recent Labs  Lab 04/16/22 1428  WBC 4.8  HGB 4.8*  HCT 16.7*  MCV 96.5  PLT 401   Basic Metabolic Panel: Recent Labs  Lab  04/16/22 1428 04/16/22 1441  NA 139  --   K 2.9*  --   CL 105  --   CO2 25  --   GLUCOSE 147*  --   BUN 22  --   CREATININE 0.83  --   CALCIUM 8.3*  --   MG  --  1.7   GFR: Estimated Creatinine Clearance: 59.5 mL/min (by C-G formula based on SCr of 0.83 mg/dL). Liver Function Tests: No results for input(s): "AST", "ALT", "ALKPHOS", "BILITOT", "PROT", "ALBUMIN" in the last 168 hours. No results for input(s): "LIPASE", "AMYLASE" in the last 168 hours. No results for input(s): "AMMONIA" in the last 168 hours. Coagulation Profile: No results for input(s): "INR", "PROTIME" in the last  168 hours. Cardiac Enzymes: No results for input(s): "CKTOTAL", "CKMB", "CKMBINDEX", "TROPONINI" in the last 168 hours. BNP (last 3 results) No results for input(s): "PROBNP" in the last 8760 hours. HbA1C: No results for input(s): "HGBA1C" in the last 72 hours. CBG: No results for input(s): "GLUCAP" in the last 168 hours. Lipid Profile: No results for input(s): "CHOL", "HDL", "LDLCALC", "TRIG", "CHOLHDL", "LDLDIRECT" in the last 72 hours. Thyroid Function Tests: No results for input(s): "TSH", "T4TOTAL", "FREET4", "T3FREE", "THYROIDAB" in the last 72 hours. Anemia Panel: Recent Labs    04/16/22 1757  FOLATE 29.0  FERRITIN 12  TIBC 322  IRON 21*  RETICCTPCT 10.5*   Urine analysis:    Component Value Date/Time   COLORURINE YELLOW (A) 04/16/2022 1757   APPEARANCEUR CLOUDY (A) 04/16/2022 1757   APPEARANCEUR Cloudy (A) 01/03/2022 1443   LABSPEC 1.013 04/16/2022 1757   PHURINE 6.0 04/16/2022 1757   GLUCOSEU NEGATIVE 04/16/2022 1757   HGBUR NEGATIVE 04/16/2022 1757   BILIRUBINUR NEGATIVE 04/16/2022 1757   BILIRUBINUR Negative 01/03/2022 1443   KETONESUR NEGATIVE 04/16/2022 1757   PROTEINUR 100 (A) 04/16/2022 1757   NITRITE NEGATIVE 04/16/2022 1757   LEUKOCYTESUR LARGE (A) 04/16/2022 1757    Radiological Exams on Admission: No results found.   Data Reviewed: Relevant notes from  primary care and specialist visits, past discharge summaries as available in EHR, including Care Everywhere. Prior diagnostic testing as pertinent to current admission diagnoses Updated medications and problem lists for reconciliation ED course, including vitals, labs, imaging, treatment and response to treatment Triage notes, nursing and pharmacy notes and ED provider's notes Notable results as noted in HPI   Assessment and Plan: * ABLA (acute blood loss anemia) Melena Gastric antral vascular ectasia with recurrent bleeding EGD performed on 03/15/2022 Gastric antral vascular ectasia without bleeding. Treated with argon plasma coagulation (APC). Recommendation to consider cryotherapy for recurrent bleeding from GAVE  Being transfused 3 units PRBCs for hemoglobin 4.8 down from 7.7, 3 weeks prior GI consult   Hypotension History of hemorrhagic shock secondary to ABLA History of septic shock secondary to nephrostomy tube associated UTI Secondary to ABLA, with some concern for sepsis Treat acute blood loss anemia as outlined We will treat empirically for UTI unless procalcitonin results less than 0.1 and or pending culture Fluid resuscitation to maintain MAP over 65  Complicated UTI (urinary tract infection) Possible severe sepsis Bilateral hydronephrosis secondary to ureteral stricture s/p left nephrostomy History of MDR Proteus UTI 9/6 S/p nephrostomy exchange on 9/20.  Next exchange scheduled for 11/15 Urinalysis concerning for UTI Given hypotension, tachypnea, abnormal UA and history of septic shock related to UTI, will treat empirically as sepsis Patient recently treated with cefepime followed by Bactrim during hospitalization from 9/19 to 9/22. Urine culture grew significant quantities of multiple species  History of pulmonary embolus (PE) History of acute on chronic DVT left leg 02/24/2022 Holding Xarelto        DVT prophylaxis:SCD  Consults: GI  Advance Care  Planning:   Code Status: Prior   Family Communication: none  Disposition Plan: Back to previous home environment  Severity of Illness: The appropriate patient status for this patient is INPATIENT. Inpatient status is judged to be reasonable and necessary in order to provide the required intensity of service to ensure the patient's safety. The patient's presenting symptoms, physical exam findings, and initial radiographic and laboratory data in the context of their chronic comorbidities is felt to place them at high risk for further clinical deterioration. Furthermore, it  is not anticipated that the patient will be medically stable for discharge from the hospital within 2 midnights of admission.   * I certify that at the point of admission it is my clinical judgment that the patient will require inpatient hospital care spanning beyond 2 midnights from the point of admission due to high intensity of service, high risk for further deterioration and high frequency of surveillance required.*  Author: Athena Masse, MD 04/16/2022 10:13 PM  For on call review [email or web address].

## 2022-04-17 DIAGNOSIS — K31819 Angiodysplasia of stomach and duodenum without bleeding: Secondary | ICD-10-CM

## 2022-04-17 DIAGNOSIS — K921 Melena: Secondary | ICD-10-CM

## 2022-04-17 DIAGNOSIS — D62 Acute posthemorrhagic anemia: Secondary | ICD-10-CM | POA: Diagnosis not present

## 2022-04-17 LAB — BASIC METABOLIC PANEL
Anion gap: 7 (ref 5–15)
BUN: 19 mg/dL (ref 8–23)
CO2: 25 mmol/L (ref 22–32)
Calcium: 8.1 mg/dL — ABNORMAL LOW (ref 8.9–10.3)
Chloride: 110 mmol/L (ref 98–111)
Creatinine, Ser: 0.83 mg/dL (ref 0.44–1.00)
GFR, Estimated: >60 mL/min (ref >60)
Glucose, Bld: 104 mg/dL — ABNORMAL HIGH (ref 70–99)
Potassium: 3.7 mmol/L (ref 3.5–5.1)
Sodium: 142 mmol/L (ref 135–145)

## 2022-04-17 LAB — BRAIN NATRIURETIC PEPTIDE: B Natriuretic Peptide: 87.4 pg/mL (ref 0.0–100.0)

## 2022-04-17 LAB — CBC
HCT: 26.6 % — ABNORMAL LOW (ref 36.0–46.0)
Hemoglobin: 8.1 g/dL — ABNORMAL LOW (ref 12.0–15.0)
MCH: 27.6 pg (ref 26.0–34.0)
MCHC: 30.5 g/dL (ref 30.0–36.0)
MCV: 90.5 fL (ref 80.0–100.0)
Platelets: 172 10*3/uL (ref 150–400)
RBC: 2.94 MIL/uL — ABNORMAL LOW (ref 3.87–5.11)
RDW: 23.9 % — ABNORMAL HIGH (ref 11.5–15.5)
WBC: 4.8 10*3/uL (ref 4.0–10.5)
nRBC: 0 % (ref 0.0–0.2)

## 2022-04-17 LAB — LACTIC ACID, PLASMA
Lactic Acid, Venous: 2.2 mmol/L (ref 0.5–1.9)
Lactic Acid, Venous: 0.8 mmol/L (ref 0.5–1.9)

## 2022-04-17 LAB — PROCALCITONIN: Procalcitonin: <0.1 ng/mL

## 2022-04-17 LAB — PREPARE RBC (CROSSMATCH)

## 2022-04-17 MED ORDER — SODIUM CHLORIDE 0.9% IV SOLUTION
Freq: Once | INTRAVENOUS | Status: AC
  Filled 2022-04-17: qty 250

## 2022-04-17 MED ORDER — SODIUM CHLORIDE 0.9 % IV SOLN
300.0000 mg | Freq: Once | INTRAVENOUS | Status: AC
  Administered 2022-04-17: 300 mg via INTRAVENOUS
  Filled 2022-04-17: qty 300

## 2022-04-17 MED ORDER — MIDODRINE HCL 5 MG PO TABS
10.0000 mg | ORAL_TABLET | Freq: Three times a day (TID) | ORAL | Status: DC
  Administered 2022-04-17 – 2022-04-20 (×8): 10 mg via ORAL
  Filled 2022-04-17 (×8): qty 2

## 2022-04-17 NOTE — ED Notes (Addendum)
Pt had large BM. BM black in color. Pt reports black tarry stool for several days. New purewick and brief in place.

## 2022-04-17 NOTE — ED Notes (Signed)
Pharmacy called to tube new protonix bag

## 2022-04-17 NOTE — Progress Notes (Signed)
PROGRESS NOTE    Courtney Mack  [id number]:[id number] DOB: 10/16/1955 DOA: 04/16/2022 PCP: [facility] [place]  ED04A/ED04A  LOS: 1 day   Brief hospital course:   Assessment & Plan: Courtney Mack is a 66 y.o. female with medical history significant for Recurrent DVT and PE on Xarelto s/p IVC filter, HTN, endometrial cancer, radiation cystitis with intermittent hematuria requiring CBI,recurrent septic shock from UTI associated with left nephrostomy tube since placement in 12/27/2021 for ureteral stricture(MDR Proteus on culture 9/6) recurrent GI bleed from GERD/GAVE treated with APC(most recently 9/8) and requiring several blood transfusions,  resumed on Xarelto at most recent discharge on 9/22, with plans for consideration of cryotherapy if recurrent bleeding who presents to the ED with a several day history of generalized weakness, lightheadedness, dyspnea with minimal exertion, and melanotic stool.     * ABLA (acute blood loss anemia) Melena Gastric antral vascular ectasia with recurrent bleeding EGD performed on 03/15/2022 Gastric antral vascular ectasia without bleeding. Treated with argon plasma coagulation (APC). Recommendation to consider cryotherapy for recurrent bleeding from GAVE  --Hgb 4.8 on presentation.  S/p 3u pRBC so far Plan: --hold Xarelto --cont PPI gtt --GI consult   Hypotension History of hemorrhagic shock secondary to ABLA History of septic shock secondary to nephrostomy tube associated UTI Secondary to ABLA, with some concern for sepsis Treat acute blood loss anemia as outlined --reduce MIVF to 50 ml/hr --start midodrine 10 mg TID  Complicated UTI (urinary tract infection) Bilateral hydronephrosis secondary to ureteral stricture s/p left nephrostomy History of MDR Proteus UTI 9/6 S/p nephrostomy exchange on 9/20.  Next exchange scheduled for 11/15 Urinalysis concerning for UTI, though pt denied dysuria during this  hospitalization. --started on cefepime Plan: --cont cefepime for now pending urine cx  Chronic diastolic CHF (congestive heart failure) (HCC) --hold home lasix due to low BP  History of pulmonary embolus (PE) History of acute on chronic DVT left leg 02/24/2022 Holding Xarelto  Sepsis ruled out --did not meet criteria   DVT prophylaxis: SCD/Compression stockings Code Status: Full code  Family Communication:  Level of care: Med-Surg Dispo:   The patient is from: home Anticipated d/c is to: home Anticipated d/c date is: 2-3 days   Subjective and Interval History:  Pt denied dysuria.  Dyspnea improved.  Chronic BLE swelling is better than baseline.   Objective: Vitals:   04/17/22 1000 04/17/22 1400 04/17/22 1500 04/17/22 1600  BP: (!) 115/49 107/83 (!) 97/45 (!) 91/43  Pulse: 70 75 79 65  Resp: '16 13 20 18  '$ Temp:  98 F (36.7 C)    TempSrc:  Oral    SpO2: 98% 97% 91% 94%  Weight:      Height:        Intake/Output Summary (Last 24 hours) at 04/17/2022 1832 Last data filed at 04/17/2022 0010 Gross per 24 hour  Intake 320 ml  Output 250 ml  Net 70 ml   Filed Weights   04/16/22 1426  Weight: 78.5 kg    Examination:   Constitutional: NAD, AAOx3 HEENT: conjunctivae and lids normal, EOMI CV: No cyanosis.   RESP: normal respiratory effort, on RA Extremities: edema in BLE SKIN: warm, dry Neuro: II - XII grossly intact.   Psych: Normal mood and affect.  Appropriate judgement and reason   Data Reviewed: I have personally reviewed labs and imaging studies  Time spent: 50 minutes   Enzo Bi, MD [facility] If 7PM-7AM, please contact night-coverage 04/17/2022, 6:32 PM

## 2022-04-17 NOTE — Consult Note (Signed)
Cephas Darby, MD [address]  [place]  [place], [place] [address]  Main: [phone]  Fax: [phone] Pager: [phone]   Consultation  Referring Provider:     No ref. provider found Primary Care Physician:  [facility] [place] Primary Gastroenterologist:  Dr. Alice Reichert       Reason for Consultation: Melena, acute on chronic anemia  Date of Admission:  04/16/2022 Date of Consultation:  04/17/2022         HPI:   Courtney Mack is a 66 y.o. female history of recurrent DVT, PE on Xarelto s/p IVC filter, history of endometrial cancer, refractory GAVE, recurrent upper GI bleed with acute blood loss anemia requiring several blood transfusions s/p multiple EGDs with treatment of GAVE with APC.  Patient again presents with several days of generalized weakness, fatigue, lightheadedness, exertional dyspnea and melena.  She was mildly hypotensive in the ER, responded to IV fluids.  Labs were significant for severe anemia hemoglobin of 4.8 dropped from 7.72 weeks ago, hypokalemia K2.9.  Patient received blood transfusion, IV Protonix, IV fluids and Rocephin for UTI. Patient has an appointment to see Dr. Tasia Catchings on 10/18 for iron deficiency anemia Her serum ferritin was 12, B12 451  NSAIDs: None  Antiplts/Anticoagulants/Anti thrombotics: Xarelto  GI Procedures: Review under under procedures tab, most recently 03/2022, GAVE was treated with APC  Past Medical History:  Diagnosis Date   Acute bilateral deep vein thrombosis (DVT) of femoral veins ([place]) 01/02/2021   Acute massive pulmonary embolism ([place]) 08/25/2020   Last Assessment & Plan:  Formatting of this note might be different from the original. Post vena caval filter and on xarelto and O2   Anemia    ARF (acute respiratory failure) ([place])    Cellulitis of left lower leg 06/20/2021   CHF (congestive heart failure) ([place])    COVID-19    Diabetes mellitus without complication ([place])    DVT (deep venous  thrombosis) ([place]) 09/06/2020   Endometrial cancer (H[facility])    GAVE (gastric antral vascular ectasia)    GERD (gastroesophageal reflux disease)    High cholesterol    Hx of blood clots    Hyperlipidemia    Hypertension    IDA (iron deficiency anemia) 09/21/2020   Obesity    Pressure injury of skin 06/21/2021   Pulmonary embolism ([place])    Sepsis ([place])    Thrombocytopenia ([place])    Urinary tract infection 09/13/2021    Past Surgical History:  Procedure Laterality Date   COLONOSCOPY N/A 06/21/2021   Procedure: COLONOSCOPY;  Surgeon: Toledo, Benay Pike, MD;  Location: [facility] ENDOSCOPY;  Service: Gastroenterology;  Laterality: N/A;   CYSTOSCOPY W/ URETERAL STENT PLACEMENT Left 09/11/2021   Procedure: CYSTOSCOPY WITH RETROGRADE PYELOGRAM/URETERAL STENT PLACEMENT;  Surgeon: Abbie Sons, MD;  Location: [facility] ORS;  Service: Urology;  Laterality: Left;   CYSTOSCOPY W/ URETERAL STENT PLACEMENT Left 11/17/2021   Procedure: CYSTOSCOPY WITH STENT REPLACEMENT;  Surgeon: Irine Seal, MD;  Location: [facility] ORS;  Service: Urology;  Laterality: Left;   CYSTOSCOPY WITH FULGERATION  11/17/2021   Procedure: CYSTOSCOPY WITH FULGERATION;  Surgeon: Irine Seal, MD;  Location: [facility] ORS;  Service: Urology;;   CYSTOSCOPY WITH STENT PLACEMENT Left 10/17/2021   Procedure: CYSTOSCOPY WITH STENT PLACEMENT;  Surgeon: Abbie Sons, MD;  Location: [facility] ORS;  Service: Urology;  Laterality: Left;   CYSTOSCOPY WITH URETEROSCOPY, STONE BASKETRY AND STENT PLACEMENT Left 10/16/2021   Procedure: CYSTOSCOPY WITH URETEROSCOPY  AND STENT REMOVAL;  Surgeon: Abbie Sons, MD;  Location: [facility] ORS;  Service: Urology;  Laterality: Left;   ENTEROSCOPY N/A 08/17/2021   Procedure: ENTEROSCOPY;  Surgeon: Lin Landsman, MD;  Location: [facility] ENDOSCOPY;  Service: Gastroenterology;  Laterality: N/A;   ESOPHAGOGASTRODUODENOSCOPY N/A 06/21/2021   Procedure: ESOPHAGOGASTRODUODENOSCOPY (EGD);  Surgeon: Toledo, Benay Pike, MD;  Location: [facility]  ENDOSCOPY;  Service: Gastroenterology;  Laterality: N/A;   ESOPHAGOGASTRODUODENOSCOPY (EGD) WITH PROPOFOL N/A 08/17/2021   Procedure: ESOPHAGOGASTRODUODENOSCOPY (EGD) WITH PROPOFOL;  Surgeon: Lin Landsman, MD;  Location: [facility] ENDOSCOPY;  Service: Gastroenterology;  Laterality: N/A;   ESOPHAGOGASTRODUODENOSCOPY (EGD) WITH PROPOFOL N/A 10/14/2021   Procedure: ESOPHAGOGASTRODUODENOSCOPY (EGD) WITH PROPOFOL;  Surgeon: [name], MD;  Location: [facility] ENDOSCOPY;  Service: Endoscopy;  Laterality: N/A;   ESOPHAGOGASTRODUODENOSCOPY (EGD) WITH PROPOFOL N/A 02/01/2022   Procedure: ESOPHAGOGASTRODUODENOSCOPY (EGD) WITH PROPOFOL;  Surgeon: Lin Landsman, MD;  Location: [facility] ENDOSCOPY;  Service: Gastroenterology;  Laterality: N/A;   ESOPHAGOGASTRODUODENOSCOPY (EGD) WITH PROPOFOL N/A 03/15/2022   Procedure: ESOPHAGOGASTRODUODENOSCOPY (EGD) WITH PROPOFOL;  Surgeon: Jonathon Bellows, MD;  Location: [facility] ENDOSCOPY;  Service: Gastroenterology;  Laterality: N/A;   GIVENS CAPSULE STUDY N/A 06/22/2021   Procedure: GIVENS CAPSULE STUDY;  Surgeon: Toledo, Benay Pike, MD;  Location: [facility] ENDOSCOPY;  Service: Gastroenterology;  Laterality: N/A;   IR NEPHROSTOMY EXCHANGE LEFT  02/25/2022   IR NEPHROSTOMY EXCHANGE LEFT  03/27/2022   IR NEPHROSTOMY PLACEMENT LEFT  12/27/2021   IVC FILTER INSERTION N/A 08/18/2020   Procedure: IVC FILTER INSERTION;  Surgeon: Katha Cabal, MD;  Location: [place] CV LAB;  Service: Cardiovascular;  Laterality: N/A;   PERIPHERAL VASCULAR THROMBECTOMY Bilateral 01/03/2021   Procedure: PERIPHERAL VASCULAR THROMBECTOMY;  Surgeon: Algernon Huxley, MD;  Location: [place] CV LAB;  Service: Cardiovascular;  Laterality: Bilateral;   PULMONARY THROMBECTOMY N/A 08/18/2020   Procedure: PULMONARY THROMBECTOMY;  Surgeon: Katha Cabal, MD;  Location: [facility] LAB;  Service: Cardiovascular;  Laterality: N/A;   TUBAL LIGATION     VISCERAL ANGIOGRAPHY N/A 07/18/2021   Procedure: VISCERAL  ANGIOGRAPHY;  Surgeon: Algernon Huxley, MD;  Location: [place] [facility];  Service: Cardiovascular;  Laterality: N/A;     Current Facility-Administered Medications:    0.9 %  sodium chloride infusion (Manually program via Guardrails IV Fluids), , Intravenous, Once, Athena Masse, MD   0.9 %  sodium chloride infusion, 10 mL/hr, Intravenous, Once, Duffy Bruce, MD, Held at 04/16/22 1758   0.9 %  sodium chloride infusion, , Intravenous, Continuous, Enzo Bi, MD, Last Rate: 50 mL/hr at 04/17/22 2246, Rate Change at 04/17/22 2246   acetaminophen (TYLENOL) tablet 650 mg, 650 mg, Oral, Q6H PRN **OR** acetaminophen (TYLENOL) suppository 650 mg, 650 mg, Rectal, Q6H PRN, Athena Masse, MD   ceFEPIme (MAXIPIME) 2 g in sodium chloride 0.9 % 100 mL IVPB, 2 g, Intravenous, Q12H, Beers, Shanon Brow, RPH, Last Rate: 200 mL/hr at 04/17/22 2245, 2 g at 04/17/22 2245   fluticasone furoate-vilanterol (BREO ELLIPTA) 100-25 MCG/ACT 1 puff, 1 puff, Inhalation, Daily, Judd Gaudier V, MD   midodrine (PROAMATINE) tablet 10 mg, 10 mg, Oral, TID WC, Enzo Bi, MD, 10 mg at 04/17/22 1919   ondansetron (ZOFRAN) tablet 4 mg, 4 mg, Oral, Q6H PRN **OR** ondansetron (ZOFRAN) injection 4 mg, 4 mg, Intravenous, Q6H PRN, Athena Masse, MD   pantoprozole (PROTONIX) 80 mg /NS 100 mL infusion, 8 mg/hr, Intravenous, Continuous, Judd Gaudier V, MD, Last Rate: 10 mL/hr at 04/17/22 0009, 8 mg/hr at 04/17/22 0009  potassium chloride (KLOR-CON) packet 40 mEq, 40 mEq, Oral, BID, Duffy Bruce, MD, 40 mEq at 04/17/22 2257   Family History  Problem Relation Age of Onset   Hypertension Mother    Diabetes Mother    Diabetes Father    Hypertension Father    High Cholesterol Father    Congestive Heart Failure Father    Breast cancer Cousin      Social History   Tobacco Use   Smoking status: Never   Smokeless tobacco: Never   Tobacco comments:    smoked 2-4 cigarettes for about 2-3 weeks   Vaping Use   Vaping Use: Never  used  Substance Use Topics   Alcohol use: Not Currently   Drug use: Not Currently    Allergies as of 04/16/2022   (No Known Allergies)    Review of Systems:    All systems reviewed and negative except where noted in HPI.   Physical Exam:  Vital signs in last 24 hours: Temp:  [97.9 F (36.6 C)-100.3 F (37.9 C)] 98.1 F (36.7 C) (10/11 0500) Pulse Rate:  [68-92] 70 (10/11 1000) Resp:  [14-31] 16 (10/11 1000) BP: (79-124)/(38-66) 115/49 (10/11 1000) SpO2:  [95 %-100 %] 98 % (10/11 1000)   General:   Pleasant, cooperative in NAD Head:  Normocephalic and atraumatic. Eyes:   No icterus.   Conjunctiva pink. PERRLA. Ears:  Normal auditory acuity. Neck:  Supple; no masses or thyroidomegaly Lungs: Respirations even and unlabored. Lungs clear to auscultation bilaterally.   No wheezes, crackles, or rhonchi.  Heart:  Regular rate and rhythm;  Without murmur, clicks, rubs or gallops Abdomen:  Soft, nondistended, nontender. Normal bowel sounds. No appreciable masses or hepatomegaly.  No rebound or guarding.  Rectal:  Not performed. Msk:  Symmetrical without gross deformities.  Strength generalized weakness Extremities:  Without edema, cyanosis or clubbing. Neurologic:  Alert and oriented x3;  grossly normal neurologically. Skin:  Intact without significant lesions or rashes. Psych:  Alert and cooperative. Normal affect.  LAB RESULTS:    Latest Ref Rng & Units 04/17/2022    5:51 AM 04/16/2022   11:25 PM 04/16/2022    2:28 PM  CBC  WBC 4.0 - 10.5 K/uL 4.8   4.8   Hemoglobin 12.0 - 15.0 g/dL 8.1  7.3  4.8   Hematocrit 36.0 - 46.0 % 26.6  25.1  16.7   Platelets 150 - 400 K/uL 172   190     BMET    Latest Ref Rng & Units 04/17/2022    5:51 AM 04/16/2022    2:28 PM 03/29/2022    6:43 AM  BMP  Glucose 70 - 99 mg/dL 104  147  137   BUN 8 - 23 mg/dL 19  22  24    Creatinine 0.44 - 1.00 mg/dL 0.83  0.83  0.93   Sodium 135 - 145 mmol/L 142  139  140   Potassium 3.5 - 5.1 mmol/L  3.7  2.9  3.8   Chloride 98 - 111 mmol/L 110  105  114   CO2 22 - 32 mmol/L 25  25  23    Calcium 8.9 - 10.3 mg/dL 8.1  8.3  8.2     LFT    Latest Ref Rng & Units 03/29/2022    6:43 AM 03/28/2022    3:00 AM 03/26/2022    9:24 PM  Hepatic Function  Total Protein 6.5 - 8.1 g/dL   6.6   Albumin 3.5 - 5.0 g/dL 2.1  2.3  2.9   AST 15 - 41 U/L   24   ALT 0 - 44 U/L   15   Alk Phosphatase 38 - 126 U/L   46   Total Bilirubin 0.3 - 1.2 mg/dL   1.4      STUDIES: No results found.    Impression / Plan:   Courtney Mack is a 66 y.o. female with history of chronic iron deficiency anemia secondary to refractory GAVE, recurrent PE/DVT on Xarelto, IVC filter is admitted with symptomatic acute on chronic blood loss anemia, melena in setting of anticoagulation  Melena, acute on chronic blood loss anemia likely secondary to recurrent bleeding from [place] has been held Continue pantoprazole drip Okay with full liquid diet Monitor CBC closely to maintain hemoglobin above 7 Recommend IV iron We will proceed with upper endoscopy tomorrow N.p.o. effective 5 AM tomorrow Patient will need to be referred to [facility] for cryotherapy treatment of GAVE  I have discussed alternative options, risks & benefits,  which include, but are not limited to, bleeding, infection, perforation,respiratory complication & drug reaction.  The patient agrees with this plan & written consent will be obtained.     Thank you for involving me in the care of this patient.      LOS: 1 day   Sherri Sear, MD  04/17/2022, 2:36 PM    Note: This dictation was prepared with Dragon dictation along with smaller phrase technology. Any transcriptional errors that result from this process are unintentional.

## 2022-04-17 NOTE — ED Notes (Signed)
Pt cleaned, new brief, and purewick.

## 2022-04-17 NOTE — Hospital Course (Signed)
Discharge Diagnoses: Principal Problem:   Complicated UTI (urinary tract infection) Active Problems:   Symptomatic anemia   Chronic diastolic CHF (congestive heart failure) (HCC)   Morbidly obese (HCC)   Endometrial cancer (HCC)   IDA (iron deficiency anemia)   HLD (hyperlipidemia)   Dyslipidemia   Chronic radiation cystitis   Lymphedema   Hospital Course: PMH of DVT PE on Xarelto, HLD, IDA, GERD, left PCN presents with fatigue and left flank pain.  Also found to have symptomatic anemia with hemoglobin of 5.8.  S/P 2 PRBC transfusion.  Urology was consulted.  No bleeding seen on therapeutic IV heparin and patient was transitioned to oral antibiotics and oral anticoagulation and discharged home   Assessment and Plan  Bilateral hydronephrosis S/P left PCN placement. Complicated UTI. Underwent nephrogram and nephrostomy tube exchange.  Urology was consulted. Treated with IV cefepime. Blood culture remain negative. Urine culture grew significant quantities of multiple species. Based on prior culture patient was discharged on oral Bactrim. Outpatient follow-up with urology recommended.   Iron deficiency anemia, acute on chronic. Acute hemoglobin drop most likely secondary to dilutional. Hemoglobin dropped down to 5.8. No active bleeding seen outside. SP 2 PRBC transfusion with appropriate elevation in hemoglobin. Patient was started on IV heparin and no active bleeding was seen. Hemoglobin also remained stable after transfusion. Currently we will transition to oral Xarelto and discharged home. Continue iron supplementation.   Acute kidney injury. Treated with IV fluids.  Baseline serum creatinine 0.8.  Currently creatinine normal.   History of DVT PE. On Xarelto. Currently holding due to concerns with anemia.   GERD.  GAVE Continue PPI. EGD performed on 03/15/2022 Gastric antral vascular ectasia without bleeding. Treated with argon plasma coagulation (APC).  If bleeding  reoccurs recommendation is to consider cryotherapy for recurrent bleeding from GAVE.   Chronic lower extremity lymphedema and wounds. Continue current therapy. Outpatient follow-up with vascular surgery recommended.   HLD. Continue statin.   Hypokalemia. Resolved.   Elevated troponin.  No evidence of elevated troponin.    Obesity Body mass index is 38.29 kg/m.  Placing the pt at higher risk of poor outcomes.   Consultants:  Urology IR   Procedures performed:  Percutaneous nephrostomy tube exchange

## 2022-04-17 NOTE — ED Notes (Signed)
Pt is A&Ox4. Pt has swelling of both legs, but says it has gone down recently. Pt is pale of skin. Pt has a JP drain in place as well as mepilex over incision on left rear back. Pt is has generalized weakness.

## 2022-04-17 NOTE — Assessment & Plan Note (Signed)
--  hold home lasix due to low BP

## 2022-04-18 ENCOUNTER — Inpatient Hospital Stay: Payer: Medicare HMO | Admitting: Anesthesiology

## 2022-04-18 ENCOUNTER — Encounter: Payer: Self-pay | Admitting: Internal Medicine

## 2022-04-18 ENCOUNTER — Encounter
Admission: EM | Disposition: A | Discharge status: Home / Self Care | Payer: Self-pay | Source: Home / Self Care | Attending: Hospitalist

## 2022-04-18 DIAGNOSIS — K31811 Angiodysplasia of stomach and duodenum with bleeding: Secondary | ICD-10-CM

## 2022-04-18 DIAGNOSIS — D62 Acute posthemorrhagic anemia: Secondary | ICD-10-CM | POA: Diagnosis not present

## 2022-04-18 DIAGNOSIS — K921 Melena: Secondary | ICD-10-CM | POA: Diagnosis not present

## 2022-04-18 DIAGNOSIS — K31819 Angiodysplasia of stomach and duodenum without bleeding: Secondary | ICD-10-CM | POA: Diagnosis not present

## 2022-04-18 HISTORY — PX: ESOPHAGOGASTRODUODENOSCOPY (EGD) WITH PROPOFOL: SHX5813

## 2022-04-18 LAB — BASIC METABOLIC PANEL
Anion gap: 3 — ABNORMAL LOW (ref 5–15)
BUN: 14 mg/dL (ref 8–23)
CO2: 23 mmol/L (ref 22–32)
Calcium: 8 mg/dL — ABNORMAL LOW (ref 8.9–10.3)
Chloride: 115 mmol/L — ABNORMAL HIGH (ref 98–111)
Creatinine, Ser: 0.7 mg/dL (ref 0.44–1.00)
GFR, Estimated: >60 mL/min (ref >60)
Glucose, Bld: 92 mg/dL (ref 70–99)
Potassium: 4.5 mmol/L (ref 3.5–5.1)
Sodium: 141 mmol/L (ref 135–145)

## 2022-04-18 LAB — CBC
HCT: 25.9 % — ABNORMAL LOW (ref 36.0–46.0)
Hemoglobin: 7.7 g/dL — ABNORMAL LOW (ref 12.0–15.0)
MCH: 26.9 pg (ref 26.0–34.0)
MCHC: 29.7 g/dL — ABNORMAL LOW (ref 30.0–36.0)
MCV: 90.6 fL (ref 80.0–100.0)
Platelets: 172 10*3/uL (ref 150–400)
RBC: 2.86 MIL/uL — ABNORMAL LOW (ref 3.87–5.11)
RDW: 23.5 % — ABNORMAL HIGH (ref 11.5–15.5)
WBC: 4.2 10*3/uL (ref 4.0–10.5)
nRBC: 0 % (ref 0.0–0.2)

## 2022-04-18 LAB — HEMOGLOBIN: Hemoglobin: 8.6 g/dL — ABNORMAL LOW (ref 12.0–15.0)

## 2022-04-18 LAB — MAGNESIUM: Magnesium: 2 mg/dL (ref 1.7–2.4)

## 2022-04-18 SURGERY — ESOPHAGOGASTRODUODENOSCOPY (EGD) WITH PROPOFOL
Anesthesia: General

## 2022-04-18 MED ORDER — ONDANSETRON HCL 4 MG/2ML IJ SOLN
INTRAMUSCULAR | Status: AC
  Filled 2022-04-18: qty 2

## 2022-04-18 MED ORDER — PROPOFOL 10 MG/ML IV BOLUS
INTRAVENOUS | Status: DC | PRN
  Administered 2022-04-18: 60 mg via INTRAVENOUS

## 2022-04-18 MED ORDER — IPRATROPIUM-ALBUTEROL 0.5-2.5 (3) MG/3ML IN SOLN
3.0000 mL | Freq: Once | RESPIRATORY_TRACT | Status: AC
  Administered 2022-04-18: 3 mL via RESPIRATORY_TRACT

## 2022-04-18 MED ORDER — PROPOFOL 500 MG/50ML IV EMUL
INTRAVENOUS | Status: DC | PRN
  Administered 2022-04-18: 140 ug/kg/min via INTRAVENOUS

## 2022-04-18 MED ORDER — IPRATROPIUM-ALBUTEROL 0.5-2.5 (3) MG/3ML IN SOLN
RESPIRATORY_TRACT | Status: AC
  Filled 2022-04-18: qty 3

## 2022-04-18 MED ORDER — PANTOPRAZOLE SODIUM 40 MG PO TBEC
40.0000 mg | DELAYED_RELEASE_TABLET | Freq: Two times a day (BID) | ORAL | Status: DC
  Administered 2022-04-18 – 2022-04-20 (×4): 40 mg via ORAL
  Filled 2022-04-18 (×4): qty 1

## 2022-04-18 MED ORDER — LIDOCAINE HCL (CARDIAC) PF 100 MG/5ML IV SOSY
PREFILLED_SYRINGE | INTRAVENOUS | Status: DC | PRN
  Administered 2022-04-18: 50 mg via INTRAVENOUS

## 2022-04-18 MED ORDER — ONDANSETRON HCL 4 MG/2ML IJ SOLN
4.0000 mg | Freq: Once | INTRAMUSCULAR | Status: AC
  Administered 2022-04-18: 4 mg via INTRAVENOUS

## 2022-04-18 NOTE — Progress Notes (Signed)
PROGRESS NOTE    Courtney Mack  QPY:[id number] DOB: Jun 18, 1956 DOA: 04/16/2022 PCP: [facility], [place]  107A/107A-AA  LOS: 2 days   Brief hospital course:   Assessment & Plan: Courtney Mack is a 66 y.o. female with medical history significant for Recurrent DVT and PE on Xarelto s/p IVC filter, HTN, endometrial cancer, radiation cystitis with intermittent hematuria requiring CBI,recurrent septic shock from UTI associated with left nephrostomy tube since placement in 12/27/2021 for ureteral stricture(MDR Proteus on culture 9/6) recurrent GI bleed from GERD/GAVE treated with APC(most recently 9/8) and requiring several blood transfusions,  resumed on Xarelto at most recent discharge on 9/22, with plans for consideration of cryotherapy if recurrent bleeding who presents to the ED with a several day history of generalized weakness, lightheadedness, dyspnea with minimal exertion, and melanotic stool.    History of hemorrhagic shock secondary to ABLA History of septic shock secondary to nephrostomy tube associated UTI   * 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) Melena Gastric antral vascular ectasia with recurrent bleeding EGD performed on 03/15/2022 Gastric antral vascular ectasia without bleeding. Treated with argon plasma coagulation (APC). Recommendation to consider cryotherapy for recurrent bleeding from GAVE  --Hgb 4.8 on presentation.  S/p 3u pRBC so far Plan: --hold Xarelto --EGD today found Gastric antral vascular ectasia without bleeding. Treated with argon plasma coagulation (APC). --transition to oral PPI BID  --Refer to main [facility] for cryotherapy of GAVE   Hypotension Secondary to ABLA, with some concern for sepsis Treat acute blood loss anemia as outlined --d/c MIVF today and encourage oral hydration --cont midodrine 10 mg TID (new)  Bilateral hydronephrosis s/p left nephrostomy tube June 2023 Bilateral hydronephrosis  secondary to ureteral stricture s/p left nephrostomy History of MDR Proteus UTI 9/6 S/p nephrostomy exchange on 9/20.  Next exchange scheduled for 11/15   Chronic diastolic CHF (congestive heart failure) (HCC) --hold home lasix due to low BP  History of pulmonary embolus (PE) History of acute on chronic DVT left leg 02/24/2022 Holding Xarelto  Acute on chronic blood loss anemia --s/p 3u pRBC so far.  Blood loss from GI bleeds. --anemia workup def in iron.  S/p IV venofer x1 Plan: --transfuse to keep Hgb >7   Sepsis ruled out --did not meet criteria  UTI, ruled out --no dysuria, urine cx only 50,000 colonies   DVT prophylaxis: SCD/Compression stockings Code Status: Full code  Family Communication:  Level of care: Telemetry Medical Dispo:   The patient is from: home Anticipated d/c is to: home Anticipated d/c date is: 1-2 days   Subjective and Interval History:  EGD today found Gastric antral vascular ectasia without bleeding. Treated with argon plasma coagulation (APC).  No dyspnea.   Objective: Vitals:   04/18/22 1246 04/18/22 1547 04/18/22 1600 04/18/22 2000  BP:  (!) 89/38 (!) 96/56 (!) 97/49  Pulse: (!) 58 62 70 65  Resp: '20 17  18  '$ Temp:  98.1 F (36.7 C)  97.9 F (36.6 C)  TempSrc:      SpO2: 100% 94%  95%  Weight:      Height:        Intake/Output Summary (Last 24 hours) at 04/18/2022 2053 Last data filed at 04/18/2022 1247 Gross per 24 hour  Intake 526.67 ml  Output 875 ml  Net -348.33 ml   Filed Weights   04/16/22 1426 04/17/22 2224  Weight: 78.5 kg 86.8 kg    Examination:   Constitutional: NAD, AAOx3 HEENT: conjunctivae  and lids normal, EOMI CV: No cyanosis.   RESP: normal respiratory effort, on RA Neuro: II - XII grossly intact.   Psych: Normal mood and affect.  Appropriate judgement and reason   Data Reviewed: I have personally reviewed labs and imaging studies  Time spent: 50 minutes   Enzo Bi, MD [facility] If  7PM-7AM, please contact night-coverage 04/18/2022, 8:53 PM

## 2022-04-18 NOTE — Anesthesia Procedure Notes (Signed)
Date/Time: 04/18/2022 11:48 AM  Performed by: Johnna Acosta, CRNAPre-anesthesia Checklist: Patient identified, Emergency Drugs available, Suction available, Patient being monitored and Timeout performed Patient Re-evaluated:Patient Re-evaluated prior to induction Oxygen Delivery Method: Nasal cannula Preoxygenation: Pre-oxygenation with 100% oxygen Induction Type: IV induction

## 2022-04-18 NOTE — Op Note (Signed)
[facility] Patient Name: Courtney Mack Procedure Date: 04/18/2022 11:48 AM MRN: [id number] Account #: [id number] Date of Birth: 13-Jul-1955 Admit Type: Inpatient Age: 66 Room: [facility] ENDO ROOM 3 Gender: Female Note Status: Finalized Instrument Name: Upper Endoscope [id number] Procedure:             Upper GI endoscopy Indications:           Acute post hemorrhagic anemia, Melena Providers:             Lin Landsman MD, MD Referring MD:          Bridget Hartshorn j. [facility], dr (Referring MD) Medicines:             General Anesthesia Complications:         No immediate complications. Estimated blood loss: None. Procedure:             Pre-Anesthesia Assessment:                        - Prior to the procedure, a History and Physical was                         performed, and patient medications and allergies were                         reviewed. The patient is competent. The risks and                         benefits of the procedure and the sedation options and                         risks were discussed with the patient. All questions                         were answered and informed consent was obtained.                         Patient identification and proposed procedure were                         verified by the physician, the nurse, the                         anesthesiologist, the anesthetist and the technician                         in the pre-procedure area in the procedure room in the                         endoscopy suite. Mental Status Examination: alert and                         oriented. Airway Examination: normal oropharyngeal                         airway and neck mobility. Respiratory Examination:                         clear to auscultation. CV Examination: normal.  Prophylactic Antibiotics: The patient does not require                         prophylactic antibiotics. Prior Anticoagulants: The                          patient has taken Eliquis (apixaban), last dose was 2                         days prior to procedure. ASA Grade Assessment: III - A                         patient with severe systemic disease. After reviewing                         the risks and benefits, the patient was deemed in                         satisfactory condition to undergo the procedure. The                         anesthesia plan was to use general anesthesia.                         Immediately prior to administration of medications,                         the patient was re-assessed for adequacy to receive                         sedatives. The heart rate, respiratory rate, oxygen                         saturations, blood pressure, adequacy of pulmonary                         ventilation, and response to care were monitored                         throughout the procedure. The physical status of the                         patient was re-assessed after the procedure.                        After obtaining informed consent, the endoscope was                         passed under direct vision. Throughout the procedure,                         the patient's blood pressure, pulse, and oxygen                         saturations were monitored continuously. The Endoscope                         was introduced through the mouth, and advanced to  the                         second part of duodenum. The upper GI endoscopy was                         accomplished without difficulty. The patient tolerated                         the procedure well. Findings:      The duodenal bulb and second portion of the duodenum were normal.      Severe, diffuse gastric antral vascular ectasia without bleeding was       present in the gastric antrum, in the prepyloric region of the stomach       and in the pylorus. Coagulation for hemostasis using argon plasma was       successful. Estimated blood loss: none.       The exam was otherwise without abnormality. Impression:            - Normal duodenal bulb and second portion of the                         duodenum.                        - Gastric antral vascular ectasia without bleeding.                         Treated with argon plasma coagulation (APC).                        - The examination was otherwise normal.                        - No specimens collected. Recommendation:        - Return patient to hospital ward for ongoing care.                        - Advance diet as tolerated today.                        - Continue present medications.                        - Continue IV iron as needed                        - Refer to main [facility] for cryotherapy of GAVE Procedure Code(s):     --- Professional ---                        [phone], Esophagogastroduodenoscopy, flexible,                         transoral; with control of bleeding, any method Diagnosis Code(s):     --- Professional ---                        K31.819, Angiodysplasia of stomach and duodenum  without bleeding                        D62, Acute posthemorrhagic anemia                        K92.1, Melena (includes Hematochezia) CPT copyright 2019 American Medical Association. All rights reserved. The codes documented in this report are preliminary and upon coder review may  be revised to meet current compliance requirements. Dr. Ulyess Mort Lin Landsman MD, MD 04/18/2022 62:26:33 PM This report has been signed electronically. Number of Addenda: 0 Note Initiated On: 04/18/2022 11:48 AM Estimated Blood Loss:  Estimated blood loss: none.      [facility]

## 2022-04-18 NOTE — Progress Notes (Signed)
Coughing up mod amount yellow mucus in endo recovery. Vomited approx. 50 ml yellow emesis. Anesth. Notified. Duoneb nebulizer adm and zofran IV '4mg'$ . Sat 100% coughing improved and no further emesis.

## 2022-04-18 NOTE — Transfer of Care (Signed)
Immediate Anesthesia Transfer of Care Note  Patient: Courtney Mack  Procedure(s) Performed: ESOPHAGOGASTRODUODENOSCOPY (EGD) WITH PROPOFOL  Patient Location: PACU  Anesthesia Type:General  Level of Consciousness: awake  Airway & Oxygen Therapy: Patient Spontanous Breathing and Patient connected to nasal cannula oxygen  Post-op Assessment: Report given to RN and Post -op Vital signs reviewed and stable  Post vital signs: Reviewed and stable  Last Vitals:  Vitals Value Taken Time  BP 93/45 04/18/22 1209  Temp 36.1 C 04/18/22 1206  Pulse 70 04/18/22 1206  Resp 30 04/18/22 1206  SpO2 94 % 04/18/22 1206    Last Pain:  Vitals:   04/18/22 1206  TempSrc: Temporal  PainSc:          Complications: No notable events documented.

## 2022-04-18 NOTE — Anesthesia Postprocedure Evaluation (Signed)
Anesthesia Post Note  Patient: Courtney Mack  Procedure(s) Performed: ESOPHAGOGASTRODUODENOSCOPY (EGD) WITH PROPOFOL  Patient location during evaluation: Endoscopy Anesthesia Type: General Level of consciousness: awake and alert Pain management: pain level controlled Vital Signs Assessment: post-procedure vital signs reviewed and stable Respiratory status: spontaneous breathing, nonlabored ventilation and respiratory function stable Cardiovascular status: blood pressure returned to baseline and stable Postop Assessment: no apparent nausea or vomiting Anesthetic complications: no   No notable events documented.   Last Vitals:  Vitals:   04/18/22 1226 04/18/22 1246  BP:    Pulse: (!) 55 (!) 58  Resp: 20 20  Temp:    SpO2: 98% 100%    Last Pain:  Vitals:   04/18/22 1206  TempSrc: Temporal  PainSc:                  [place] Ouch

## 2022-04-18 NOTE — Plan of Care (Signed)

## 2022-04-18 NOTE — TOC Initial Note (Signed)
Transition of Care [facility]) - Initial/Assessment Note    Patient Details  Name: Courtney Mack MRN: [id number] Date of Birth: October 26, 1955  [facility] CM/SW Contact:    Candie Chroman, LCSW Phone Number: 04/18/2022, 2:52 PM  Clinical Narrative:  Readmission prevention screen complete. CSW met with patient. No supports at bedside. CSW introduced role and explained that discharge planning would be discussed. PCP is Dr. Clide Deutscher at [facility]. Daughter drives her to appointments. Pharmacy is [facility] on the corner of [address]. No issues obtaining medications. Patient lives home with her daughter. No home health prior to admission. Patient has a rollator, wheelchair, and bedside commode at home. No further concerns. CSW encouraged patient to contact CSW as needed. CSW will continue to follow patient for support and facilitate return home when stable. Daughter will transport her home at discharge.                Expected Discharge Plan: Home/Self Care Barriers to Discharge: Continued Medical Work up   Patient Goals and CMS Choice        Expected Discharge Plan and Services Expected Discharge Plan: Home/Self Care     Post Acute Care Choice: NA Living arrangements for the past 2 months: Single Family Home                                      Prior Living Arrangements/Services Living arrangements for the past 2 months: Single Family Home Lives with:: Adult Children Patient language and need for interpreter reviewed:: Yes Do you feel safe going back to the place where you live?: Yes      Need for Family Participation in Patient Care: Yes (Comment) Care giver support system in place?: Yes (comment) Current home services: DME Criminal Activity/Legal Involvement Pertinent to Current Situation/Hospitalization: No - Comment as needed  Activities of Daily Living Home Assistive Devices/Equipment: Dentures (specify type), Eyeglasses,  Walker (specify type), Cane (specify quad or straight) ADL Screening (condition at time of admission) Patient's cognitive ability adequate to safely complete daily activities?: Yes Is the patient deaf or have difficulty hearing?: No Does the patient have difficulty seeing, even when wearing glasses/contacts?: Yes Does the patient have difficulty concentrating, remembering, or making decisions?: No Patient able to express need for assistance with ADLs?: Yes Does the patient have difficulty dressing or bathing?: Yes Independently performs ADLs?: No Communication: Independent Dressing (OT): Needs assistance Is this a change from baseline?: Pre-admission baseline Grooming: Independent Feeding: Independent Bathing: Needs assistance Is this a change from baseline?: Pre-admission baseline Toileting: Needs assistance Is this a change from baseline?: Pre-admission baseline In/Out Bed: Dependent Is this a change from baseline?: Pre-admission baseline Walks in Home: Dependent Is this a change from baseline?: Change from baseline, expected to last <3 days Does the patient have difficulty walking or climbing stairs?: Yes Weakness of Legs: Both Weakness of Arms/Hands: None  Permission Sought/Granted                  Emotional Assessment Appearance:: Appears stated age Attitude/Demeanor/Rapport: Engaged, Gracious Affect (typically observed): Accepting, Appropriate, Calm, Pleasant Orientation: : Oriented to Self, Oriented to Place, Oriented to  Time, Oriented to Situation Alcohol / Substance Use: Not Applicable Psych Involvement: No (comment)  Admission diagnosis:  Hypokalemia [J73.6] Complicated UTI (urinary tract infection) [N39.0] Acute on chronic blood loss anemia [D62] ABLA (acute blood loss anemia) [D62]  Patient Active Problem List   Diagnosis Date Noted   ABLA (acute blood loss anemia) 38/04/1750   Complicated UTI (urinary tract infection) 03/26/2022   GI bleed 03/14/2022    Symptomatic anemia 02/24/2022   Hypotension 02/24/2022   Acute cystitis with hematuria    Lymphedema 02/08/2022   Hematuria 12/24/2021   History of pulmonary embolus (PE) 12/24/2021   Chronic radiation cystitis 11/17/2021   Bilateral hydronephrosis s/p left nephrostomy tube June 2023    Sepsis with hypotension ([place]) 10/17/2021   Gastric antral vascular ectasia    Melena 07/18/2021   Acute on chronic blood loss anemia 07/18/2021   HLD (hyperlipidemia) 06/20/2021   Chronic diastolic CHF (congestive heart failure) ([place]) 06/20/2021   Risk factors for obstructive sleep apnea 11/10/2020   IDA (iron deficiency anemia) 09/21/2020   Endometrial cancer ([place]) 09/13/2020   Acute on chronic right femoral DVT (deep venous thrombosis) ([place]) 09/06/2020   Morbidly obese ([place]) 08/23/2020   Dyslipidemia 08/23/2020   PCP:  [facility] [place]:   [id number] [address] [place] [address] Phone: [phone] Fax: [place] [address] Phone: [phone] Fax: [phone]     Social Determinants of Health (SDOH) Interventions    Readmission Risk Interventions    04/18/2022    2:50 PM 03/14/2022    3:51 PM 02/24/2022    2:07 PM  Readmission Risk Prevention Plan  Transportation Screening Complete Complete Complete  Medication Review ([place]) Complete Complete Complete  PCP or Specialist appointment within 3-5 days of discharge Complete    HRI or Home Care Consult  Complete Patient refused  SW Recovery Care/Counseling Consult Complete Complete   Palliative Care Screening Not Applicable Not Applicable Complete  Skilled [facility] Not Applicable Not Applicable Not Applicable

## 2022-04-18 NOTE — Anesthesia Preprocedure Evaluation (Addendum)
Anesthesia Evaluation  Patient identified by MRN, date of birth, ID band Patient awake    Reviewed: Allergy & Precautions, NPO status , Patient's Chart, lab work & pertinent test results  History of Anesthesia Complications Negative for: history of anesthetic complications  Airway Mallampati: III       Dental  (+) Upper Dentures, Poor Dentition, Missing   Pulmonary shortness of breath,  Chronic cough  CTA chest has + PE : Small pulmonary embolus seen in the posterior right lower lobe pulmonary arterial branch. No evidence of right heart strain    + wheezing      Cardiovascular Exercise Tolerance: Poor hypertension, +CHF (diastolic) and + DOE   Rhythm:Regular Rate:Normal + Peripheral Edema midodrine 10 mg TID  Hx DVT and PE on Xarelto  ECG 10/16/21: ST (HR 124); QTC 427; low voltage precordial leads; no st elevation  Echo 09/12/21: 1. Left ventricular ejection fraction, by estimation, is 55 to 60%. The left ventricle has normal function. The left ventricle has no regional wall motion abnormalities. There is mild asymmetric left ventricular hypertrophy of the basal-septal segment.  Left ventricular diastolic parameters were normal.  2. Right ventricular systolic function is normal. The right ventricular size is normal.    Neuro/Psych negative neurological ROS  negative psych ROS   GI/Hepatic PUD, recurrent GI bleed from GERD/GAVE treated with APC(most recently 9/8) and requiring several blood transfusions, Hgb 4.8 on presentation.  S/p 3u pRBC so far   Endo/Other  diabetes, Type 2Class 3 obesity  Renal/GU Renal disease (nephrolithiasis s/p stent 10/16/21)Residual hydronephrosis     Musculoskeletal   Abdominal Normal abdominal exam  (+)   Peds  Hematology  (+) Blood dyscrasia, anemia , Anticoagulation for history of DVT/PE 2022, Post vena caval filter    Anesthesia Other Findings     Reproductive/Obstetrics Uterine CA                            Anesthesia Physical  Anesthesia Plan  ASA: 3  Anesthesia Plan: General   Post-op Pain Management:    Induction: Intravenous  PONV Risk Score and Plan: 2 and Propofol infusion and TIVA  Airway Management Planned: Natural Airway and Simple Face Mask  Additional Equipment:   Intra-op Plan:   Post-operative Plan:   Informed Consent: I have reviewed the patients History and Physical, chart, labs and discussed the procedure including the risks, benefits and alternatives for the proposed anesthesia with the patient or authorized representative who has indicated his/her understanding and acceptance.     Dental advisory given  Plan Discussed with: CRNA  Anesthesia Plan Comments:        Anesthesia Quick Evaluation

## 2022-04-18 NOTE — Assessment & Plan Note (Signed)
Bilateral hydronephrosis secondary to ureteral stricture s/p left nephrostomy History of MDR Proteus UTI 9/6 S/p nephrostomy exchange on 9/20.  Next exchange scheduled for 11/15

## 2022-04-18 NOTE — Progress Notes (Signed)
Nutrition Brief Note  Patient identified on the Malnutrition Screening Tool (MST) Report  Wt Readings from Last 15 Encounters:  04/17/22 86.8 kg  04/11/22 78.5 kg  03/27/22 86 kg  03/14/22 84.7 kg  02/23/22 79.4 kg  02/05/22 78.9 kg  01/29/22 81.6 kg  01/03/22 81.6 kg  12/24/21 81.6 kg  12/14/21 84.4 kg  11/29/21 78.5 kg  11/26/21 82.7 kg  11/19/21 84.6 kg  11/14/21 78.5 kg  10/23/21 94.3 kg   Pt with medical history significant for Recurrent DVT and PE, HTN, endometrial cancer, radiation cystitis with intermittent hematuria requiring CBI, recurrent septic shock from UTI associated with left nephrostomy tube for ureteral stricture, recurrent GI bleed from GERD/GAVE, with plans for consideration of cryotherapy if recurrent bleeding who presents with a several day history of generalized weakness, lightheadedness, dyspnea with minimal exertion, and melanotic stool.  Pt admitted with ABLA, melena, and gastric antral vascular ectasia with recurrent bleeding.   10/12- s/p EGD- revealed normal duodenal bulb, gastric antral vascular ectasia without bleeding (treated with APC)  Reviewed I/O's: +202 ml x 24 hours and +272 ml since admission  UOP: 375 ml x 24 hours  Pt unavailable at time of visit. Pt down in endo suite x 2. RD unable to obtain further nutrition-related history or complete nutrition-focused physical exam at this time.    Reviewed wt hx; wt has been stable over the past 3 months.   Medications reviewed and include potassium chloride.   Lab Results  Component Value Date   HGBA1C 5.7 (H) 11/16/2021   PTA DM medications are none.   Labs reviewed: CBGS: 110 (inpatient orders for glycemic control are none).    Body mass index is 38.65 kg/m. Patient meets criteria for obesity, class II based on current BMI. Obesity is a complex, chronic medical condition that is optimally managed by a multidisciplinary care team. Weight loss is not an ideal goal for an acute inpatient  hospitalization. However, if further work-up for obesity is warranted, consider outpatient referral to outpatient bariatric service and/or [facility].    Current diet order is regular, patient is consuming approximately n/a% of meals at this time. Labs and medications reviewed.   No nutrition interventions warranted at this time. If nutrition issues arise, please consult RD.   Loistine Chance, RD, LDN, [place] Registered Dietitian II Certified Diabetes Care and Education Specialist Please refer to [facility] for RD and/or RD on-call/weekend/after hours pager

## 2022-04-18 NOTE — Assessment & Plan Note (Addendum)
--  s/p 3u pRBC so far.  Blood loss from GI bleeds. --anemia workup def in iron.  S/p IV iron x2 Plan: --transfuse to keep Hgb >7

## 2022-04-19 ENCOUNTER — Inpatient Hospital Stay: Payer: Medicare HMO

## 2022-04-19 ENCOUNTER — Encounter: Payer: Self-pay | Admitting: Gastroenterology

## 2022-04-19 DIAGNOSIS — K31811 Angiodysplasia of stomach and duodenum with bleeding: Secondary | ICD-10-CM | POA: Diagnosis not present

## 2022-04-19 LAB — CBC
HCT: 30.2 % — ABNORMAL LOW (ref 36.0–46.0)
Hemoglobin: 9 g/dL — ABNORMAL LOW (ref 12.0–15.0)
MCH: 27.4 pg (ref 26.0–34.0)
MCHC: 29.8 g/dL — ABNORMAL LOW (ref 30.0–36.0)
MCV: 91.8 fL (ref 80.0–100.0)
Platelets: 187 10*3/uL (ref 150–400)
RBC: 3.29 MIL/uL — ABNORMAL LOW (ref 3.87–5.11)
RDW: 22.5 % — ABNORMAL HIGH (ref 11.5–15.5)
WBC: 5.4 10*3/uL (ref 4.0–10.5)
nRBC: 0 % (ref 0.0–0.2)

## 2022-04-19 LAB — URINE CULTURE: Culture: 50000 — AB

## 2022-04-19 LAB — BASIC METABOLIC PANEL
Anion gap: 3 — ABNORMAL LOW (ref 5–15)
BUN: 13 mg/dL (ref 8–23)
CO2: 25 mmol/L (ref 22–32)
Calcium: 8.6 mg/dL — ABNORMAL LOW (ref 8.9–10.3)
Chloride: 111 mmol/L (ref 98–111)
Creatinine, Ser: 1.01 mg/dL — ABNORMAL HIGH (ref 0.44–1.00)
GFR, Estimated: >60 mL/min (ref >60)
Glucose, Bld: 130 mg/dL — ABNORMAL HIGH (ref 70–99)
Potassium: 5 mmol/L (ref 3.5–5.1)
Sodium: 139 mmol/L (ref 135–145)

## 2022-04-19 LAB — MAGNESIUM: Magnesium: 2.1 mg/dL (ref 1.7–2.4)

## 2022-04-19 MED ORDER — SODIUM CHLORIDE 0.9 % IV SOLN
250.0000 mg | Freq: Once | INTRAVENOUS | Status: AC
  Administered 2022-04-19: 250 mg via INTRAVENOUS
  Filled 2022-04-19: qty 20

## 2022-04-19 NOTE — Progress Notes (Signed)
PROGRESS NOTE    Courtney Mack  YIF:[id number] DOB: April 27, 1956 DOA: 04/16/2022 PCP: [facility] [place]  107A/107A-AA  LOS: 3 days   Brief hospital course:   Assessment & Plan: Courtney Mack is a 66 y.o. female with medical history significant for Recurrent DVT and PE on Xarelto s/p IVC filter, HTN, endometrial cancer, radiation cystitis with intermittent hematuria requiring CBI,recurrent septic shock from UTI associated with left nephrostomy tube since placement in 12/27/2021 for ureteral stricture(MDR Proteus on culture 9/6) recurrent GI bleed from GERD/GAVE treated with APC(most recently 9/8) and requiring several blood transfusions,  resumed on Xarelto at most recent discharge on 9/22, with plans for consideration of cryotherapy if recurrent bleeding who presents to the ED with a several day history of generalized weakness, lightheadedness, dyspnea with minimal exertion, and melanotic stool.    History of hemorrhagic shock secondary to ABLA History of septic shock secondary to nephrostomy tube associated UTI   * 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) Melena Gastric antral vascular ectasia with recurrent bleeding EGD performed on 03/15/2022 Gastric antral vascular ectasia without bleeding. Treated with argon plasma coagulation (APC). Recommendation to consider cryotherapy for recurrent bleeding from GAVE  --Hgb 4.8 on presentation.  S/p 3u pRBC so far --EGD on 10/12 found Gastric antral vascular ectasia without bleeding. Treated with argon plasma coagulation (APC). Plan: --hold Xarelto --cont oral PPI BID  --consider Refer to main [facility] for cryotherapy of GAVE   Hypotension Secondary to ABLA, with some concern for sepsis Treat acute blood loss anemia as outlined --d/c'ed MIVF on 10/12 and encourage oral hydration --cont midodrine 10 mg TID (new)  Bilateral hydronephrosis s/p left nephrostomy tube June 2023 Bilateral  hydronephrosis secondary to ureteral stricture s/p left nephrostomy History of MDR Proteus UTI 9/6 S/p nephrostomy exchange on 9/20.  Next exchange scheduled for 11/15   Chronic diastolic CHF (congestive heart failure) (HCC) --hold home lasix due to low BP  History of pulmonary embolus (PE) History of acute on chronic DVT left leg 02/24/2022 Holding Xarelto  Acute on chronic blood loss anemia --s/p 3u pRBC so far.  Blood loss from GI bleeds. --anemia workup def in iron.  S/p IV iron x2 Plan: --transfuse to keep Hgb >7   Sepsis ruled out --did not meet criteria  UTI, ruled out --no dysuria, urine cx only 50,000 colonies   DVT prophylaxis: SCD/Compression stockings Code Status: Full code  Family Communication: daughter updated at bedside today Level of care: Telemetry Medical Dispo:   The patient is from: home Anticipated d/c is to: home Anticipated d/c date is: tomorrow   Subjective and Interval History:  Pt complained of abdominal discomfort today, but overall doing better.  After discussing with pt and daughter, decision made to hold Xarelto.   Objective: Vitals:   04/19/22 0537 04/19/22 0747 04/19/22 1231 04/19/22 1626  BP: (!) 98/46 (!) 102/44 (!) 121/50 (!) 118/57  Pulse: 63 (!) 59 61 (!) 55  Resp: '18 20  17  '$ Temp: 98.1 F (36.7 C) 98.3 F (36.8 C) 99.1 F (37.3 C) 98.9 F (37.2 C)  TempSrc: Oral     SpO2: 95% 95% 95% 95%  Weight:      Height:        Intake/Output Summary (Last 24 hours) at 04/19/2022 2006 Last data filed at 04/19/2022 1842 Gross per 24 hour  Intake 720 ml  Output 2925 ml  Net -2205 ml   Filed Weights   04/16/22 1426 04/17/22 2224  Weight: 78.5 kg 86.8 kg    Examination:   Constitutional: NAD, AAOx3 HEENT: conjunctivae and lids normal, EOMI CV: No cyanosis.   RESP: normal respiratory effort, on RA Extremities: chronic edema in BLE SKIN: warm, dry Neuro: II - XII grossly intact.   Psych: Normal mood and affect.   Appropriate judgement and reason   Data Reviewed: I have personally reviewed labs and imaging studies  Time spent: 35 minutes   Enzo Bi, MD [facility] If 7PM-7AM, please contact night-coverage 04/19/2022, 8:06 PM

## 2022-04-19 NOTE — Care Management Important Message (Signed)
Important Message  Patient Details  Name: Courtney Mack MRN: [id number] Date of Birth: May 27, 1956   Medicare Important Message Given:  Yes     [name] A Onnie Hatchel 04/19/2022, 12:32 PM

## 2022-04-20 LAB — TYPE AND SCREEN
ABO/RH(D): A POS
Antibody Screen: POSITIVE
Donor AG Type: NEGATIVE
Donor AG Type: NEGATIVE
Donor AG Type: NEGATIVE
Unit division: 0
Unit division: 0
Unit division: 0
Unit division: 0
Unit division: 0

## 2022-04-20 LAB — BASIC METABOLIC PANEL
Anion gap: 5 (ref 5–15)
BUN: 15 mg/dL (ref 8–23)
CO2: 22 mmol/L (ref 22–32)
Calcium: 8.5 mg/dL — ABNORMAL LOW (ref 8.9–10.3)
Chloride: 111 mmol/L (ref 98–111)
Creatinine, Ser: 0.78 mg/dL (ref 0.44–1.00)
GFR, Estimated: >60 mL/min (ref >60)
Glucose, Bld: 120 mg/dL — ABNORMAL HIGH (ref 70–99)
Potassium: 4.5 mmol/L (ref 3.5–5.1)
Sodium: 138 mmol/L (ref 135–145)

## 2022-04-20 LAB — BPAM RBC
Blood Product Expiration Date: 202311042359
Blood Product Expiration Date: 202311042359
Blood Product Expiration Date: 202311052359
Blood Product Expiration Date: 202311102359
Blood Product Expiration Date: 202311122359
ISSUE DATE / TIME: 202310101848
ISSUE DATE / TIME: 202310102108
ISSUE DATE / TIME: 202310110308
Unit Type and Rh: 5100
Unit Type and Rh: 5100
Unit Type and Rh: 5100
Unit Type and Rh: 6200
Unit Type and Rh: 6200

## 2022-04-20 LAB — CBC
HCT: 30.6 % — ABNORMAL LOW (ref 36.0–46.0)
Hemoglobin: 8.8 g/dL — ABNORMAL LOW (ref 12.0–15.0)
MCH: 27.4 pg (ref 26.0–34.0)
MCHC: 28.8 g/dL — ABNORMAL LOW (ref 30.0–36.0)
MCV: 95.3 fL (ref 80.0–100.0)
Platelets: 185 10*3/uL (ref 150–400)
RBC: 3.21 MIL/uL — ABNORMAL LOW (ref 3.87–5.11)
RDW: 22.3 % — ABNORMAL HIGH (ref 11.5–15.5)
WBC: 4.5 10*3/uL (ref 4.0–10.5)
nRBC: 0 % (ref 0.0–0.2)

## 2022-04-20 LAB — MAGNESIUM: Magnesium: 2.1 mg/dL (ref 1.7–2.4)

## 2022-04-20 LAB — GLUCOSE, CAPILLARY: Glucose-Capillary: 151 mg/dL — ABNORMAL HIGH (ref 70–99)

## 2022-04-20 MED ORDER — MIDODRINE HCL 10 MG PO TABS: 10.0000 mg | ORAL_TABLET | Freq: Three times a day (TID) | ORAL | 2 refills | Status: AC

## 2022-04-20 MED ORDER — FUROSEMIDE 20 MG PO TABS: 20.0000 mg | ORAL_TABLET | Freq: Every day | ORAL | Status: DC

## 2022-04-20 MED ORDER — FUROSEMIDE 20 MG PO TABS: ORAL_TABLET | ORAL | 11 refills | Status: DC

## 2022-04-20 NOTE — Discharge Summary (Signed)
Physician Discharge Summary   Courtney Mack  female DOB: [birth year]  CBJ:[id number]  PCP: [facility] date: 04/16/2022 Discharge date: 04/20/2022  Admitted From: home Disposition:  home CODE STATUS: Full code  Discharge Instructions     Discharge instructions   Complete by: As directed    You presented again with GI bleeding, with Hgb down to 4.8 requiring multiple blood transfusions.  After discussion with you and your daughter, decision was made to stop your blood thinner Xarelto, however, that puts you at risk for further blood clots in your lungs and legs (although if blood clots are formed in your legs due to inactivities, they will be blocked by IVC filter from going to your lungs).  Please monitor your respiratory status and return to hospital if you start having trouble breathing or having chest pain.  Because your blood pressure has been low, you are started on Midodrine to increase blood pressure.   Dr. Enzo Bi [facility] Course:  For full details, please see H&P, progress notes, consult notes and ancillary notes.  Briefly,  Courtney Mack is a 66 y.o. female with medical history significant for Recurrent DVT and PE on Xarelto s/p IVC filter, HTN, endometrial cancer, radiation cystitis with intermittent hematuria requiring CBI, recurrent septic shock from UTI associated with left nephrostomy tube since placement in 12/27/2021 for ureteral stricture (MDR Proteus on culture 9/6), recurrent GI bleed from GERD/GAVE treated with APC (most recently 9/8) and requiring several blood transfusions, resumed on Xarelto at most recent discharge on 9/22, who presented to the ED with a several day history of generalized weakness, lightheadedness, dyspnea with minimal exertion, and melanotic stool.  Hgb 4.8 on presentation.    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   Xarelto held on presentation.  S/p 3u pRBC during this hospitalization. --EGD on 10/12 found Gastric antral vascular ectasia without bleeding. Treated with argon plasma coagulation (APC). --there was consideration to Refer to main [facility] for cryotherapy of GAVE, however, this is not a permanent fix and only delays not prevent development of GAVE. --cont oral PPI BID  --After discussion with pt and daughter, decision was made to hold Xarelto indefinitely.  Pt and daughter know to return to hospital if pt has any respiratory decline to suggest development of acute PE.  Acute on chronic blood loss anemia --s/p 3u pRBC.  Blood loss from GI bleeds. --anemia workup def in iron.  S/p IV iron x2 --Hgb 8.8 prior to discharge.  History of recurrent pulmonary embolus and DVT S/p IVC filter on 08/18/20 --After discussion with pt and daughter, decision was made to hold Xarelto indefinitely.  Pt and daughter know to return to hospital if pt has any respiratory decline to suggest development of acute PE.   Hypotension Secondary to ABLA.  Treat acute blood loss anemia as outlined --d/c'ed MIVF on 10/12 and encourage oral hydration --cont midodrine 10 mg TID (new)   Bilateral hydronephrosis s/p left nephrostomy tube June 2023 Bilateral hydronephrosis secondary to ureteral stricture s/p left nephrostomy History of MDR Proteus UTI 9/6 S/p nephrostomy exchange on 9/20.  Next exchange scheduled for 11/15   Chronic diastolic CHF (congestive heart failure) ([place]) --home lasix held during hospitalization due to low BP, and to be resumed after discharge per pt preference to treat LE edema.   Sepsis ruled out --did not meet criteria   UTI,  ruled out --no dysuria, urine cx only 50,000 colonies  History of hemorrhagic shock secondary to ABLA History of septic shock secondary to nephrostomy tube associated UTI   Discharge Diagnoses:  Principal Problem:   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 vascular  ectasia (GAVE) Active Problems:   Melena   Hypotension   Gastric antral vascular ectasia   Bilateral hydronephrosis s/p left nephrostomy tube June 2023   Chronic diastolic CHF (congestive heart failure) (HCC)   Acute on chronic blood loss anemia   History of pulmonary embolus (PE)   30 Day Unplanned Readmission Risk Score    Flowsheet Row ED to Hosp-Admission (Current) from 04/16/2022 in [facility]  30 Day Unplanned Readmission Risk Score (%) 54.45 Filed at 04/20/2022 0800       This score is the patient's risk of an unplanned readmission within 30 days of being discharged (0 -100%). The score is based on dignosis, age, lab data, medications, orders, and past utilization.   Low:  0-14.9   Medium: 15-21.9   High: 22-29.9   Extreme: 30 and above         Discharge Instructions:  Allergies as of 04/20/2022   No Known Allergies      Medication List     STOP taking these medications    feeding supplement Liqd   rivaroxaban 20 MG Tabs tablet Commonly known as: XARELTO       TAKE these medications    acetaminophen 500 MG tablet Commonly known as: TYLENOL Take 500 mg by mouth every 6 (six) hours as needed for mild pain or fever.   cyanocobalamin 1000 MCG tablet Take 1 tablet (1,000 mcg total) by mouth daily.   ferrous sulfate 325 (65 FE) MG tablet Take 1 tablet (325 mg total) by mouth 2 (two) times daily with a meal.   fluticasone furoate-vilanterol 100-25 MCG/ACT Aepb Commonly known as: BREO ELLIPTA Inhale 1 puff into the lungs daily.   furosemide 20 MG tablet Commonly known as: Lasix Take 1 tablet (20 mg total) by mouth daily. Home med. What changed:  how much to take how to take this when to take this additional instructions   midodrine 10 MG tablet Commonly known as: PROAMATINE Take 1 tablet (10 mg total) by mouth 3 (three) times daily with meals.   mometasone-formoterol 100-5 MCG/ACT Aero Commonly known  as: DULERA Inhale 2 puffs into the lungs 2 (two) times daily.   multivitamin with minerals Tabs tablet Take 1 tablet by mouth daily.   pantoprazole 40 MG tablet Commonly known as: PROTONIX Take 1 tablet (40 mg total) by mouth 2 (two) times daily.   polyethylene glycol 17 g packet Commonly known as: MIRALAX / GLYCOLAX Take 17 g by mouth daily as needed for mild constipation.   rosuvastatin 10 MG tablet Commonly known as: CRESTOR Take 1 tablet (10 mg total) by mouth daily. What changed: when to take this   vitamin C 1000 MG tablet Take 1,000 mg by mouth daily.         Follow-up [place] [place] Follow up in 1 week(s).   Specialty: General Practice Contact information: [place] [address] [phone]                 No Known Allergies   The results of significant diagnostics from this hospitalization (including imaging, microbiology, ancillary and laboratory) are listed below for reference.   Consultations:  Procedures/Studies: No results found.    Labs: BNP (last 3 results) Recent Labs    02/23/22 2327 03/26/22 1942 04/16/22 2325  BNP 51.9 86.5 41.2   Basic Metabolic Panel: Recent Labs  Lab 04/20/22 0430  NA 138  K 4.5  CL 111  CO2 22  GLUCOSE 120*  BUN 15  CREATININE 0.78  CALCIUM 8.5*  MG 2.1   Liver Function Tests: No results for input(s): "AST", "ALT", "ALKPHOS", "BILITOT", "PROT", "ALBUMIN" in the last 168 hours. No results for input(s): "LIPASE", "AMYLASE" in the last 168 hours. No results for input(s): "AMMONIA" in the last 168 hours. CBC: Recent Labs  Lab 04/20/22 0430  WBC 4.5  HGB 8.8*  HCT 30.6*  MCV 95.3  PLT 185   Cardiac Enzymes: No results for input(s): "CKTOTAL", "CKMB", "CKMBINDEX", "TROPONINI" in the last 168 hours. BNP: Invalid input(s): "POCBNP" CBG: Recent Labs  Lab 04/20/22 0844  GLUCAP 151*   D-Dimer No results for  input(s): "DDIMER" in the last 72 hours. Hgb A1c No results for input(s): "HGBA1C" in the last 72 hours. Lipid Profile No results for input(s): "CHOL", "HDL", "LDLCALC", "TRIG", "CHOLHDL", "LDLDIRECT" in the last 72 hours. Thyroid function studies No results for input(s): "TSH", "T4TOTAL", "T3FREE", "THYROIDAB" in the last 72 hours.  Invalid input(s): "FREET3" Anemia work up No results for input(s): "VITAMINB12", "FOLATE", "FERRITIN", "TIBC", "IRON", "RETICCTPCT" in the last 72 hours. Urinalysis    Component Value Date/Time   COLORURINE YELLOW (A) 04/16/2022 1757   APPEARANCEUR CLOUDY (A) 04/16/2022 1757   APPEARANCEUR Cloudy (A) 01/03/2022 1443   LABSPEC 1.013 04/16/2022 1757   PHURINE 6.0 04/16/2022 1757   GLUCOSEU NEGATIVE 04/16/2022 1757   HGBUR NEGATIVE 04/16/2022 1757   BILIRUBINUR NEGATIVE 04/16/2022 1757   BILIRUBINUR Negative 01/03/2022 [place] 04/16/2022 1757   PROTEINUR 100 (A) 04/16/2022 1757   NITRITE NEGATIVE 04/16/2022 1757   LEUKOCYTESUR LARGE (A) 04/16/2022 1757   Sepsis Labs Recent Labs  Lab 04/20/22 0430  WBC 4.5   Microbiology Recent Results (from the past 240 hour(s))  Blood culture (routine x 2)     Status: None   Collection Time: 04/16/22  8:15 PM   Specimen: BLOOD  Result Value Ref Range Status   Specimen Description BLOOD BLOOD RIGHT FOREARM  Final   Special Requests   Final    BOTTLES DRAWN AEROBIC AND ANAEROBIC Blood Culture results may not be optimal due to an inadequate volume of blood received in culture bottles   Culture   Final    NO GROWTH 5 DAYS Performed at [facility], [address] [place], [place] [address]    Report Status 04/21/2022 FINAL  Final  Blood culture (routine x 2)     Status: None   Collection Time: 04/16/22  8:15 PM   Specimen: BLOOD  Result Value Ref Range Status   Specimen Description BLOOD BLOOD LEFT HAND  Final   Special Requests   Final    BOTTLES DRAWN AEROBIC AND ANAEROBIC  Blood Culture results may not be optimal due to an inadequate volume of blood received in culture bottles   Culture   Final    NO GROWTH 5 DAYS Performed at [facility], [address], [place], [place] [address]    Report Status 04/21/2022 FINAL  Final  Urine Culture     Status: Abnormal   Collection Time: 04/16/22 11:25 PM   Specimen: Urine, Clean Catch  Result Value Ref Range Status   Specimen  Description   Final    URINE, CLEAN CATCH Performed at [facility], [address] [place], [place] [address]    Special Requests   Final    NONE Performed at [facility], [place], [place] [address]    Culture 50,000 COLONIES/mL ENTEROCOCCUS FAECALIS (A)  Final   Report Status 04/19/2022 FINAL  Final   Organism ID, Bacteria ENTEROCOCCUS FAECALIS (A)  Final      Susceptibility   Enterococcus faecalis - MIC*    AMPICILLIN <=2 SENSITIVE Sensitive     NITROFURANTOIN <=16 SENSITIVE Sensitive     VANCOMYCIN 1 SENSITIVE Sensitive     * 50,000 COLONIES/mL ENTEROCOCCUS FAECALIS     Total time spend on discharging this patient, including the last patient exam, discussing the hospital stay, instructions for ongoing care as it relates to all pertinent caregivers, as well as preparing the medical discharge records, prescriptions, and/or referrals as applicable, is 40 minutes.    Enzo Bi, MD  [facility] 04/26/2022, 5:48 PM

## 2022-04-20 NOTE — Progress Notes (Signed)
Courtney Mack to be D/C'd  per MD order.  Discussed with the patient and all questions fully answered.  VSS, Skin clean, dry and intact without evidence of unassessed or new skin break down, no evidence of skin tears noted.  IV catheter discontinued intact. Site without signs and symptoms of complications. Dressing and pressure applied.  An After Visit Summary was printed and given to the patient. Patient received prescription.  D/c education completed with patient/family including follow up instructions, medication list, d/c activities limitations if indicated, with other d/c instructions as indicated by MD - patient able to verbalize understanding, all questions fully answered.   Patient instructed to return to ED, call 911, or call MD for any changes in condition.   Patient to be escorted via [place], and D/C home via private auto.

## 2022-04-21 LAB — CULTURE, BLOOD (ROUTINE X 2)
Culture: NO GROWTH
Culture: NO GROWTH

## 2022-04-22 ENCOUNTER — Ambulatory Visit: Payer: Medicaid Other | Admitting: Radiology

## 2022-04-22 NOTE — Progress Notes (Unsigned)
04/23/2022 9:10 PM   Courtney Mack June 01, 1956 [id number]  Referring provider: [facility] [address],  [place] [address]  Urological history: 1. High risk hematuria -former smoker -several contrast and non-contrast CT's over the last year -no findings of GU malignancies  -cysto (11/2021) -consistent with radiation changes in the bladder -no reports of gross heme -UA ***  2. Left hydronephrosis -initially managed w/ left ureteral stent but discontinued due to continuing hematuria -managed with nephrostomy tube last exchanged (03/27/2022) -scheduled for nephrostomy tube exchange (05/22/2022)   3. Nephrolithiasis -stone composition -emergent left stent placement for 2 mm distal stone (09/2021) -stone spontaneously passed after stent placed  4. rUTI's -contributing factors of age, vaginal atrophy, nephrostomy tube, diabetes,  -Documented urine cultures over the last year  March 26, 2022 multiple species  March 13, 2022 Proteus penneri  February 25, 2022 multiple species  February 24, 2022 multiple species  December 27, 2021 Enterococcus faecium and yeast  December 24, 2020 Enterococcus faecium  Nov 16, 2021 no growth  October 17, 2021 no growth  October 08, 2021 multiple species  September 11, 2021 no growth  September 11, 2021 Proteus Mirabilis  August 14, 2021 multiple species -Vitamin C ***   No chief complaint on file.   HPI: Courtney Mack is a 66 y.o. female who presents today for one month follow up after hospitalization.    She was discharged on March 29, 2022 after being treated for complicated UTI, iron deficient anemia, AKI and chronic lower extremities lymphedema with wounds.     PMH: Past Medical History:  Diagnosis Date   Acute bilateral deep vein thrombosis (DVT) of femoral veins ([place]) 01/02/2021   Acute massive pulmonary embolism ([place]) 08/25/2020   Last Assessment & Plan:  Formatting of this note  might be different from the original. Post vena caval filter and on xarelto and O2   Anemia    ARF (acute respiratory failure) ([place])    Cellulitis of left lower leg 06/20/2021   CHF (congestive heart failure) ([place])    COVID-19    Diabetes mellitus without complication ([place])    DVT (deep venous thrombosis) ([place]) 09/06/2020   Endometrial cancer (HCC)    GAVE (gastric antral vascular ectasia)    GERD (gastroesophageal reflux disease)    High cholesterol    Hx of blood clots    Hyperlipidemia    Hypertension    IDA (iron deficiency anemia) 09/21/2020   Obesity    Pressure injury of skin 06/21/2021   Pulmonary embolism (HCC)    Sepsis ([place])    Thrombocytopenia (HCC)    Urinary tract infection 09/13/2021    Surgical History: Past Surgical History:  Procedure Laterality Date   COLONOSCOPY N/A 06/21/2021   Procedure: COLONOSCOPY;  Surgeon: Toledo, Benay Pike, MD;  Location: [facility] ENDOSCOPY;  Service: Gastroenterology;  Laterality: N/A;   CYSTOSCOPY W/ URETERAL STENT PLACEMENT Left 09/11/2021   Procedure: CYSTOSCOPY WITH RETROGRADE PYELOGRAM/URETERAL STENT PLACEMENT;  Surgeon: Abbie Sons, MD;  Location: [facility] ORS;  Service: Urology;  Laterality: Left;   CYSTOSCOPY W/ URETERAL STENT PLACEMENT Left 11/17/2021   Procedure: CYSTOSCOPY WITH STENT REPLACEMENT;  Surgeon: Irine Seal, MD;  Location: [facility] ORS;  Service: Urology;  Laterality: Left;   CYSTOSCOPY WITH FULGERATION  11/17/2021   Procedure: CYSTOSCOPY WITH FULGERATION;  Surgeon: Irine Seal, MD;  Location: [facility] ORS;  Service: Urology;;   CYSTOSCOPY WITH STENT PLACEMENT Left 10/17/2021   Procedure: CYSTOSCOPY WITH  STENT PLACEMENT;  Surgeon: Abbie Sons, MD;  Location: [facility] ORS;  Service: Urology;  Laterality: Left;   CYSTOSCOPY WITH URETEROSCOPY, STONE BASKETRY AND STENT PLACEMENT Left 10/16/2021   Procedure: CYSTOSCOPY WITH URETEROSCOPY  AND STENT REMOVAL;  Surgeon: Abbie Sons, MD;  Location: [facility] ORS;  Service: Urology;   Laterality: Left;   ENTEROSCOPY N/A 08/17/2021   Procedure: ENTEROSCOPY;  Surgeon: Lin Landsman, MD;  Location: [facility] ENDOSCOPY;  Service: Gastroenterology;  Laterality: N/A;   ESOPHAGOGASTRODUODENOSCOPY N/A 06/21/2021   Procedure: ESOPHAGOGASTRODUODENOSCOPY (EGD);  Surgeon: Toledo, Benay Pike, MD;  Location: [facility] ENDOSCOPY;  Service: Gastroenterology;  Laterality: N/A;   ESOPHAGOGASTRODUODENOSCOPY (EGD) WITH PROPOFOL N/A 08/17/2021   Procedure: ESOPHAGOGASTRODUODENOSCOPY (EGD) WITH PROPOFOL;  Surgeon: Lin Landsman, MD;  Location: [facility] ENDOSCOPY;  Service: Gastroenterology;  Laterality: N/A;   ESOPHAGOGASTRODUODENOSCOPY (EGD) WITH PROPOFOL N/A 10/14/2021   Procedure: ESOPHAGOGASTRODUODENOSCOPY (EGD) WITH PROPOFOL;  Surgeon: [name], MD;  Location: [facility] ENDOSCOPY;  Service: Endoscopy;  Laterality: N/A;   ESOPHAGOGASTRODUODENOSCOPY (EGD) WITH PROPOFOL N/A 02/01/2022   Procedure: ESOPHAGOGASTRODUODENOSCOPY (EGD) WITH PROPOFOL;  Surgeon: Lin Landsman, MD;  Location[facility] END[facility]COPY;  Service: Gastroenterology;  Laterality: N/A;   ESOPHAGOGASTRODUODENOSCOPY (EGD) WITH PROPOFOL N/A 03/15/2022   Procedure: ESOPHAGOGASTRODUODENOSCOPY (EGD) WITH PROPOFOL;  Surgeon: Jonathon Bellows, MD;  Location: [facility] ENDOSCOPY;  Service: Gastroenterology;  Laterality: N/A;   ESOPHAGOGASTRODUODENOSCOPY (EGD) WITH PROPOFOL N/A 04/18/2022   Procedure: ESOPHAGOGASTRODUODENOSCOPY (EGD) WITH PROPOFOL;  Surgeon: Lin Landsman, MD;  Location: [facility] ENDOSCOPY;  Service: Gastroenterology;  Laterality: N/A;   GIVENS CAPSULE STUDY N/A 06/22/2021   Procedure: GIVENS CAPSULE STUDY;  Surgeon: Toledo, Benay Pike, MD;  Location: [facility] ENDOSCOPY;  Service: Gastroenterology;  Laterality: N/A;   IR NEPHROSTOMY EXCHANGE LEFT  02/25/2022   IR NEPHROSTOMY EXCHANGE LEFT  03/27/2022   IR NEPHROSTOMY PLACEMENT LEFT  12/27/2021   IVC FILTER INSERTION N/A 08/18/2020   Procedure: IVC FILTER INSERTION;  Surgeon: Katha Cabal, MD;   Location: [place] CV LAB;  Service: Cardiovascular;  Laterality: N/A;   PERIPHERAL VASCULAR THROMBECTOMY Bilateral 01/03/2021   Procedure: PERIPHERAL VASCULAR THROMBECTOMY;  Surgeon: Algernon Huxley, MD;  Location: [place] CV LAB;  Service: Cardiovascular;  Laterality: Bilateral;   PULMONARY THROMBECTOMY N/A 08/18/2020   Procedure: PULMONARY THROMBECTOMY;  Surgeon: Katha Cabal, MD;  Location: [facility] CV LAB;  Service: Cardiovascular;  Laterality: N/A;   TUBAL LIGATION     VISCERAL ANGIOGRAPHY N/A 07/18/2021   Procedure: VISCERAL ANGIOGRAPHY;  Surgeon: Algernon Huxley, MD;  Location: [place] CV LAB;  Service: Cardiovascular;  Laterality: N/A;    Home Medications:  Allergies as of 04/23/2022   No Known Allergies      Medication List        Accurate as of April 22, 2022  9:10 PM. If you have any questions, ask your nurse or doctor.          acetaminophen 500 MG tablet Commonly known as: TYLENOL Take 500 mg by mouth every 6 (six) hours as needed for mild pain or fever.   cyanocobalamin 1000 MCG tablet Take 1 tablet (1,000 mcg total) by mouth daily.   ferrous sulfate 325 (65 FE) MG tablet Take 1 tablet (325 mg total) by mouth 2 (two) times daily with a meal.   fluticasone furoate-vilanterol 100-25 MCG/ACT Aepb Commonly known as: BREO ELLIPTA Inhale 1 puff into the lungs daily.   furosemide 20 MG tablet Commonly known as: Lasix Take 1 tablet (20 mg total) by mouth daily. Home  med.   midodrine 10 MG tablet Commonly known as: PROAMATINE Take 1 tablet (10 mg total) by mouth 3 (three) times daily with meals.   mometasone-formoterol 100-5 MCG/ACT Aero Commonly known as: DULERA Inhale 2 puffs into the lungs 2 (two) times daily.   multivitamin with minerals Tabs tablet Take 1 tablet by mouth daily.   pantoprazole 40 MG tablet Commonly known as: PROTONIX Take 1 tablet (40 mg total) by mouth 2 (two) times daily.   polyethylene glycol 17 g  packet Commonly known as: MIRALAX / GLYCOLAX Take 17 g by mouth daily as needed for mild constipation.   rosuvastatin 10 MG tablet Commonly known as: CRESTOR Take 1 tablet (10 mg total) by mouth daily. What changed: when to take this   vitamin C 1000 MG tablet Take 1,000 mg by mouth daily.        Allergies: No Known Allergies  Family History: Family History  Problem Relation Age of Onset   Hypertension Mother    Diabetes Mother    Diabetes Father    Hypertension Father    High Cholesterol Father    Congestive Heart Failure Father    Breast cancer Cousin     Social History:  reports that she has never smoked. She has never used smokeless tobacco. She reports that she does not currently use alcohol. She reports that she does not currently use drugs.  ROS: Pertinent ROS in HPI  Physical Exam: There were no vitals taken for this visit.  Constitutional:  Well nourished. Alert and oriented, No acute distress. HEENT: [place] AT, moist mucus membranes.  Trachea midline, no masses. Cardiovascular: No clubbing, cyanosis, or edema. Respiratory: Normal respiratory effort, no increased work of breathing. GU: No CVA tenderness.  No bladder fullness or masses. Vulvovaginal atrophy w/ pallor, loss of rugae, introital retraction, excoriations.  Vulvar thinning, fusion of labia, clitoral hood retraction, prominent urethral meatus.   *** external genitalia, *** pubic hair distribution, no lesions.  Normal urethral meatus, no lesions, no prolapse, no discharge.   No urethral masses, tenderness and/or tenderness. No bladder fullness, tenderness or masses. *** vagina mucosa, *** estrogen effect, no discharge, no lesions, *** pelvic support, *** cystocele and *** rectocele noted.  No cervical motion tenderness.  Uterus is freely mobile and non-fixed.  No adnexal/parametria masses or tenderness noted.  Anus and perineum are without rashes or lesions.   ***  Neurologic: Grossly intact, no focal deficits,  moving all 4 extremities. Psychiatric: Normal mood and affect.    Laboratory Data: Lab Results  Component Value Date   WBC 4.5 04/20/2022   HGB 8.8 (L) 04/20/2022   HCT 30.6 (L) 04/20/2022   MCV 95.3 04/20/2022   PLT 185 04/20/2022    Lab Results  Component Value Date   CREATININE 0.78 04/20/2022    Lab Results  Component Value Date   HGBA1C 5.7 (H) 11/16/2021    Lab Results  Component Value Date   AST 24 03/26/2022   Lab Results  Component Value Date   ALT 15 03/26/2022    Urinalysis ***  I have reviewed the labs.   Pertinent Imaging: ***  Assessment & Plan:  ***  1. High risk hematuria -multiple CT's and recent cysto (11/2021) saw changes consistent with radiation  -no reports of gross heme ***  2. rUTI's. - criteria for recurrent UTI has been met with 2 or more infections in 6 months or 3 or greater infections in one year  -Not a candidate for vaginal  estrogen cream due to history of endometrial cancer  - patient is instructed to increase their water intake until the urine is pale yellow or clear (10 to 12 cups daily) ***  - patient is instructed to take probiotics (yogurt, oral pills or vaginal suppositories), take cranberry pills or drink the juice and Vitamin C 1,000 mg daily to acidify the urine ***  - if using tampons, she should remove them prior to urinating and change them often ***  - avoid soaking in tubs and wipe front to back after urinating ***  - benefit from core strengthening exercises has been seen.  We can refer to PT if they desire ***  - advised them to have CATH UA's for urinalysis and culture to prevent skin, vaginal and/or rectal contamination of the specimen  - reviewed symptoms of UTI (fevers, chills, gross hematuria, mental status changes, dysuria, suprapubic pain, back pain and/or sudden worsening of urinary symptoms) and advised not to have urine checked or be treated for UTI if not experiencing symptoms  - discussed antibiotic  stewardship with the patient - explained the risk of increasing risk of antibiotic resistance with continuous exposure to antibiotics, renal failure, hypoglycemia, C. Diff infection, allergic reactions, etc.    3. Left hydronephrosis *** -Managed with nephrostomy tube and scheduled for exchange on May 18, 2022                                               No follow-ups on file.  These notes generated with voice recognition software. I apologize for typographical errors.  [place], [place] [address]  [place],  [address] [phone]

## 2022-04-23 ENCOUNTER — Ambulatory Visit (INDEPENDENT_AMBULATORY_CARE_PROVIDER_SITE_OTHER): Payer: Medicare HMO | Admitting: Urology

## 2022-04-23 ENCOUNTER — Encounter: Payer: Self-pay | Admitting: Urology

## 2022-04-23 VITALS — BP 153/83 | HR 74 | Ht 59.0 in | Wt 179.0 lb

## 2022-04-23 DIAGNOSIS — N1330 Unspecified hydronephrosis: Secondary | ICD-10-CM

## 2022-04-23 DIAGNOSIS — Z87440 Personal history of urinary (tract) infections: Secondary | ICD-10-CM

## 2022-04-23 DIAGNOSIS — R319 Hematuria, unspecified: Secondary | ICD-10-CM

## 2022-04-23 DIAGNOSIS — N390 Urinary tract infection, site not specified: Secondary | ICD-10-CM

## 2022-04-23 DIAGNOSIS — R32 Unspecified urinary incontinence: Secondary | ICD-10-CM

## 2022-04-23 LAB — BLADDER SCAN AMB NON-IMAGING: Scan Result: 8

## 2022-04-24 ENCOUNTER — Ambulatory Visit: Payer: Medicaid Other | Admitting: Oncology

## 2022-04-24 ENCOUNTER — Ambulatory Visit: Payer: Medicaid Other

## 2022-04-29 ENCOUNTER — Other Ambulatory Visit: Payer: Self-pay | Admitting: Family Medicine

## 2022-04-29 DIAGNOSIS — Z1231 Encounter for screening mammogram for malignant neoplasm of breast: Secondary | ICD-10-CM

## 2022-05-03 ENCOUNTER — Inpatient Hospital Stay: Payer: Medicare HMO | Attending: Nurse Practitioner

## 2022-05-03 ENCOUNTER — Encounter: Payer: Self-pay | Admitting: Oncology

## 2022-05-03 DIAGNOSIS — C541 Malignant neoplasm of endometrium: Secondary | ICD-10-CM | POA: Insufficient documentation

## 2022-05-03 DIAGNOSIS — D509 Iron deficiency anemia, unspecified: Secondary | ICD-10-CM | POA: Insufficient documentation

## 2022-05-03 DIAGNOSIS — Z923 Personal history of irradiation: Secondary | ICD-10-CM | POA: Insufficient documentation

## 2022-05-03 DIAGNOSIS — R310 Gross hematuria: Secondary | ICD-10-CM | POA: Insufficient documentation

## 2022-05-03 DIAGNOSIS — I509 Heart failure, unspecified: Secondary | ICD-10-CM | POA: Diagnosis not present

## 2022-05-03 DIAGNOSIS — Z86718 Personal history of other venous thrombosis and embolism: Secondary | ICD-10-CM | POA: Insufficient documentation

## 2022-05-03 DIAGNOSIS — E119 Type 2 diabetes mellitus without complications: Secondary | ICD-10-CM | POA: Insufficient documentation

## 2022-05-03 DIAGNOSIS — D500 Iron deficiency anemia secondary to blood loss (chronic): Secondary | ICD-10-CM

## 2022-05-03 DIAGNOSIS — I110 Hypertensive heart disease with heart failure: Secondary | ICD-10-CM | POA: Diagnosis not present

## 2022-05-03 LAB — CBC WITH DIFFERENTIAL/PLATELET
Abs Immature Granulocytes: 0.01 10*3/uL (ref 0.00–0.07)
Basophils Absolute: 0 10*3/uL (ref 0.0–0.1)
Basophils Relative: 1 %
Eosinophils Absolute: 0.1 10*3/uL (ref 0.0–0.5)
Eosinophils Relative: 4 %
HCT: 35.7 % — ABNORMAL LOW (ref 36.0–46.0)
Hemoglobin: 10.9 g/dL — ABNORMAL LOW (ref 12.0–15.0)
Immature Granulocytes: 0 %
Lymphocytes Relative: 11 %
Lymphs Abs: 0.4 10*3/uL — ABNORMAL LOW (ref 0.7–4.0)
MCH: 28.8 pg (ref 26.0–34.0)
MCHC: 30.5 g/dL (ref 30.0–36.0)
MCV: 94.4 fL (ref 80.0–100.0)
Monocytes Absolute: 0.2 10*3/uL (ref 0.1–1.0)
Monocytes Relative: 6 %
Neutro Abs: 3 10*3/uL (ref 1.7–7.7)
Neutrophils Relative %: 78 %
Platelets: 172 10*3/uL (ref 150–400)
RBC: 3.78 MIL/uL — ABNORMAL LOW (ref 3.87–5.11)
RDW: 19.8 % — ABNORMAL HIGH (ref 11.5–15.5)
WBC: 3.8 10*3/uL — ABNORMAL LOW (ref 4.0–10.5)
nRBC: 0 % (ref 0.0–0.2)

## 2022-05-03 LAB — RETIC PANEL
Immature Retic Fract: 8.6 % (ref 2.3–15.9)
RBC.: 3.81 MIL/uL — ABNORMAL LOW (ref 3.87–5.11)
Retic Count, Absolute: 46.5 10*3/uL (ref 19.0–186.0)
Retic Ct Pct: 1.2 % (ref 0.4–3.1)
Reticulocyte Hemoglobin: 32.9 pg (ref >27.9)

## 2022-05-03 LAB — COMPREHENSIVE METABOLIC PANEL
ALT: 14 U/L (ref 0–44)
AST: 18 U/L (ref 15–41)
Albumin: 3.6 g/dL (ref 3.5–5.0)
Alkaline Phosphatase: 48 U/L (ref 38–126)
Anion gap: 10 (ref 5–15)
BUN: 32 mg/dL — ABNORMAL HIGH (ref 8–23)
CO2: 28 mmol/L (ref 22–32)
Calcium: 9.3 mg/dL (ref 8.9–10.3)
Chloride: 101 mmol/L (ref 98–111)
Creatinine, Ser: 0.99 mg/dL (ref 0.44–1.00)
GFR, Estimated: >60 mL/min (ref >60)
Glucose, Bld: 107 mg/dL — ABNORMAL HIGH (ref 70–99)
Potassium: 4 mmol/L (ref 3.5–5.1)
Sodium: 139 mmol/L (ref 135–145)
Total Bilirubin: 0.9 mg/dL (ref 0.3–1.2)
Total Protein: 7.5 g/dL (ref 6.5–8.1)

## 2022-05-03 LAB — IRON AND TIBC
Iron: 41 ug/dL (ref 28–170)
Saturation Ratios: 13 % (ref 10.4–31.8)
TIBC: 318 ug/dL (ref 250–450)
UIBC: 277 ug/dL

## 2022-05-03 LAB — FERRITIN: Ferritin: 57 ng/mL (ref 11–307)

## 2022-05-06 ENCOUNTER — Encounter (INDEPENDENT_AMBULATORY_CARE_PROVIDER_SITE_OTHER): Payer: Self-pay

## 2022-05-06 ENCOUNTER — Inpatient Hospital Stay: Payer: Medicare HMO

## 2022-05-06 ENCOUNTER — Inpatient Hospital Stay (HOSPITAL_BASED_OUTPATIENT_CLINIC_OR_DEPARTMENT_OTHER): Payer: Medicare HMO | Admitting: Oncology

## 2022-05-06 ENCOUNTER — Encounter: Payer: Self-pay | Admitting: Oncology

## 2022-05-06 VITALS — BP 116/71 | HR 79 | Temp 96.7°F | Wt 163.0 lb

## 2022-05-06 DIAGNOSIS — D500 Iron deficiency anemia secondary to blood loss (chronic): Secondary | ICD-10-CM | POA: Diagnosis not present

## 2022-05-06 DIAGNOSIS — D509 Iron deficiency anemia, unspecified: Secondary | ICD-10-CM | POA: Diagnosis not present

## 2022-05-06 DIAGNOSIS — C541 Malignant neoplasm of endometrium: Secondary | ICD-10-CM | POA: Diagnosis not present

## 2022-05-06 DIAGNOSIS — I82409 Acute embolism and thrombosis of unspecified deep veins of unspecified lower extremity: Secondary | ICD-10-CM | POA: Diagnosis not present

## 2022-05-06 MED ORDER — SODIUM CHLORIDE 0.9 % IV SOLN
200.0000 mg | Freq: Once | INTRAVENOUS | Status: AC
  Administered 2022-05-06: 200 mg via INTRAVENOUS
  Filled 2022-05-06: qty 200

## 2022-05-06 MED ORDER — SODIUM CHLORIDE 0.9 % IV SOLN
Freq: Once | INTRAVENOUS | Status: AC
  Filled 2022-05-06: qty 250

## 2022-05-06 NOTE — Assessment & Plan Note (Signed)
#  Iron deficiency anemia, Hb 10.9 Recommend  IV Venofer weekly x 1

## 2022-05-06 NOTE — Assessment & Plan Note (Signed)
S/P IVC Not able to tolerate anticoagulation due to gross hematuria

## 2022-05-06 NOTE — Progress Notes (Signed)
Hematology/Oncology Progress note [phone] [phone]      Patient Care Team: Center, [facility] as PCP - General (General Practice) Clent Jacks, RN as Oncology Nurse Navigator Earlie Server, MD as Consulting Physician (Hematology and Oncology)  ASSESSMENT & PLAN:   IDA (iron deficiency anemia) #Iron deficiency anemia, Hb 10.9 Recommend  IV Venofer weekly x 1  Recurrent deep vein thrombosis (DVT) ([place]) S/P IVC Not able to tolerate anticoagulation due to gross hematuria    Endometrial cancer ([place]) Stage II endometrial cancer with cervical involvement or cervical cancer.  Not a candidate of surgery and chemotherapy.  Status post radiation and brachytherapy. Recommend her to follow up with gynecology oncology for follow-up and pelvic examination.  Orders Placed This Encounter  Procedures   CBC with Differential/Platelet    Standing Status:   Future    Standing Expiration Date:   05/07/2023   Ferritin    Standing Status:   Future    Standing Expiration Date:   05/07/2023   Iron and TIBC    Standing Status:   Future    Standing Expiration Date:   05/07/2023   Follow up in 4 months.  All questions were answered. The patient knows to call the clinic with any problems, questions or concerns.  Earlie Server, MD, PhD [facility] Hematology Oncology 05/06/2022   CHIEF COMPLAINTS/REASON FOR VISIT:   endometrial cancer, iron deficiency anemia, recurrent thrombosis  HISTORY OF PRESENTING ILLNESS:   Courtney Mack is a  66 y.o.  female with PMH listed below was seen in consultation at the request of  Center, [place]*  for evaluation of endometrial cancer.  #February 2022, patient was hospitalized to due to left lower extremity DVT, extensive bilateral PE, status post embolectomy and IVC filter placement.  Patient was discharged on anticoagulation with Xarelto. Patient reports that she has had intermittent vaginal bleeding for  the past few years.  After she was started on Xarelto, she started to experience frequent vaginal bleeding 09/06/2020, US pelvic complete showed a diffusely thickened and heterogeneous appearance of the endometrium, concerning for endometrial carcinoma.-  Uterus: Measurements: 10.4 x 5.2 x 5.9 cm = volume: 166 mL. No fibroids or other mass visualized. Endometrium Thickness: 29. The endometrium is diffusely heterogeneous and thickened with increased vascularity.Right and left ovaries: Not visualized.  No abnormal free fluid  Patient was evaluated by gynecology Dr. Amalia Hailey.  On physical examination, patient had malodorous friable necrotic appearing cervix was seen. Cervical biopsy performed. An endometrial biopsy was attempted but the cervical os was not well visualized.   # 09/06/2020 Cervical and endometrial biopsies A. UTERINE CERVIX; BIOPSY:   ENDOMETRIOID ENDOMETRIAL ADENOCARCINOMA, FAVOR LOW GRADE (FIGO GRADE 1-2).   B. ENDOMETRIUM; BIOPSY: - ENDOMETRIOID ENDOMETRIAL ADENOCARCINOMA, FAVOR LOW GRADE (FIGO GRADE 1-2).  She presents for evaluation. She continues to have bleeding.  Patient was seen by gynecology oncology Dr. Theora Gianotti on 09/13/2020.  Patient is not a surgical candidate.  Refer to radiation oncology and my dog for further evaluation.  Patient presented to emergency room on 09/16/2020, 09/20/2020 for vaginal bleeding and passing clots.. 09/16/2020, hemoglobin 9.7, MCV 80 09/20/2020, hemoglobin 8.8, MCV 80.5. 08/24/2020, iron saturation 5, ferritin 12.  Today patient has been seen by radiation oncology.  PET scan has been scheduled on 09/28/2020. Due to the ongoing bleeding, decision was made to proceed with radiation on 09/25/2020. She reports very tired and fatigued.  Ongoing vaginal bleeding and passing blood clots..  Denies any  chest pain, shortness of breath.Some left lower abdomen discomfort.  Chronic other medical problems include hypertension, morbid obesity, diabetes, chronic  anticoagulation for recent bilateral extensive PE and a DVT.  01/02/2021 - 01/05/2021, patient was hospitalized due to worsening of bilateral lower extremity swelling.  She was found to have extensive occlusive thrombus throughout both femoral vein.  CT angiogram of the chest also showed small volume of chronic and nonocclusive PE. Patient was started on heparin drip.  Status post embolectomy by vascular surgeon.  Patient at that time claimed that she has been compliant with Xarelto.  After discussing with hospitalist, decision was made to switch patient to Lovenox injection after discharge.  Prescription was sent by hospitalist.  [employer] initially did not approve her Lovenox.  Patient ended up restarted on Xarelto for anticoagulation. Today patient was accompanied by her brother. Per patient and her brother, family realized that patient had ran out of Xarelto for about at least 2 weeks prior to the onset of her symptoms. With additional history, it is possible that patient developed a recurrent thrombosis due to not being on anticoagulation.  Patient was recommended to resume Xarelto 20 mg daily.  11/16/2021 - 11/19/2021, patient was admitted due to gross hematuria.  Patient is anemic is status post PRBC transfusion. During this admission, patient had a CT scan done which showed small clot burden/pulm emboli in the right posterior pulmonary artery.  Ultrasound Doppler showed acute on chronic DVT.  Patient has been off Xarelto due to hematuria prior to the admission for approximately 1 months.  Hematuria was attributed to her ureteral stents.  Patient was started on heparin drip.Patient was evaluated by urology. S/p cystoscopy with removal and replacement of left double-J stent..   hematuria likely secondary to radiation cystitis.  Patient also  was treated for UTI with Rocephin.  Urine and blood cultures were negative.  At discharge, patient was resumed on Xarelto 10 mg daily.  12/25/21 Venous US  bilaterally lower extremities 1. Stable venous duplex of the bilateral lower extremities. Again noted is incomplete compressibility of the bilateral common femoral veins, profunda femoral veins, femoral veins and popliteal veins. Findings are most compatible with the sequelae of previous DVT. No evidence to suggest acute thrombosis. 2. Limited evaluation of the deep calf veins bilaterally.  02/24/22 Venous US bilaterally lower extremities Chronic partially occlusive thrombus noted within the right common femoral vein and proximal superficial femoral vein. Occlusive thrombus noted in the mid to distal right femoral vein. This was partially occluded previously suggesting an acute component. Chronic partially occlusive thrombus in the left common femoral vein, superficial femoral vein and popliteal vein.    INTERVAL HISTORY Courtney Mack is a 66 y.o. female who has above history reviewed by me today presents for posthospitalization follow-up for recurrent thrombosis.  Gross hematuria, history of GI bleeding, history of endometrial cancer status post radiation and brachytherapy. 03/26/22-03/29/22 Hospitalization due to complicated UTI, Bilateral hydronephrosis,Underwent nephrogram and nephrostomy tube exchange, acute on chronic anemia, s/p PRBC transfusion. Xarelto was held and resume after discharge.   04/16/22 -04/20/22 patient was hospitalized due to acute GI bleeding due to GAVE Hgb 4.8 on presentation.  Xarelto held on presentation.  S/p 3u pRBC during this hospitalization.  Today patient presents for posthospitalization follow-up. She denies any additional episodes of bleeding events.    Review of Systems  Constitutional:  Negative for appetite change, chills, fatigue and fever.  HENT:   Negative for hearing loss and voice change.   Eyes:  Negative for eye problems.  Respiratory:  Negative for chest tightness and cough.   Cardiovascular:  Positive for leg swelling. Negative for chest  pain.  Gastrointestinal:  Negative for abdominal distention, abdominal pain and blood in stool.  Endocrine: Negative for hot flashes.  Genitourinary:  Negative for difficulty urinating, frequency and vaginal bleeding.   Musculoskeletal:  Negative for arthralgias.  Skin:  Negative for itching and rash.  Neurological:  Negative for extremity weakness.  Hematological:  Negative for adenopathy.  Psychiatric/Behavioral:  Negative for confusion.       MEDICAL HISTORY:  Past Medical History:  Diagnosis Date   Acute bilateral deep vein thrombosis (DVT) of femoral veins ([place]) 01/02/2021   Acute massive pulmonary embolism ([place]) 08/25/2020   Last Assessment & Plan:  Formatting of this note might be different from the original. Post vena caval filter and on xarelto and O2   Anemia    ARF (acute respiratory failure) ([place])    Cellulitis of left lower leg 06/20/2021   CHF (congestive heart failure) ([place])    COVID-19    Diabetes mellitus without complication ([place])    DVT (deep venous thrombosis) ([place]) 09/06/2020   Endometrial cancer ([facility])    GAVE (gastric antral vascular ectasia)    GERD (gastroesophageal reflux disease)    High cholesterol    Hx of blood clots    Hyperlipidemia    Hypertension    IDA (iron deficiency anemia) 09/21/2020   Obesity    Pressure injury of skin 06/21/2021   Pulmonary embolism ([place])    Sepsis ([place])    Thrombocytopenia ([place])    Urinary tract infection 09/13/2021    SURGICAL HISTORY: Past Surgical History:  Procedure Laterality Date   COLONOSCOPY N/A 06/21/2021   Procedure: COLONOSCOPY;  Surgeon: Toledo, Benay Pike, MD;  Location: [facility] ENDOSCOPY;  Service: Gastroenterology;  Laterality: N/A;   CYSTOSCOPY W/ URETERAL STENT PLACEMENT Left 09/11/2021   Procedure: CYSTOSCOPY WITH RETROGRADE PYELOGRAM/URETERAL STENT PLACEMENT;  Surgeon: Abbie Sons, MD;  Location: [facility] ORS;  Service: Urology;  Laterality: Left;   CYSTOSCOPY W/ URETERAL STENT PLACEMENT Left  11/17/2021   Procedure: CYSTOSCOPY WITH STENT REPLACEMENT;  Surgeon: Irine Seal, MD;  Location: [facility] ORS;  Service: Urology;  Laterality: Left;   CYSTOSCOPY WITH FULGERATION  11/17/2021   Procedure: CYSTOSCOPY WITH FULGERATION;  Surgeon: Irine Seal, MD;  Location: [facility] ORS;  Service: Urology;;   CYSTOSCOPY WITH STENT PLACEMENT Left 10/17/2021   Procedure: CYSTOSCOPY WITH STENT PLACEMENT;  Surgeon: Abbie Sons, MD;  Location: [facility] ORS;  Service: Urology;  Laterality: Left;   CYSTOSCOPY WITH URETEROSCOPY, STONE BASKETRY AND STENT PLACEMENT Left 10/16/2021   Procedure: CYSTOSCOPY WITH URETEROSCOPY  AND STENT REMOVAL;  Surgeon: Abbie Sons, MD;  Location: [facility] ORS;  Service: Urology;  Laterality: Left;   ENTEROSCOPY N/A 08/17/2021   Procedure: ENTEROSCOPY;  Surgeon: Lin Landsman, MD;  Location: [facility] ENDOSCOPY;  Service: Gastroenterology;  Laterality: N/A;   ESOPHAGOGASTRODUODENOSCOPY N/A 06/21/2021   Procedure: ESOPHAGOGASTRODUODENOSCOPY (EGD);  Surgeon: Toledo, Benay Pike, MD;  Location: [facility] ENDOSCOPY;  Service: Gastroenterology;  Laterality: N/A;   ESOPHAGOGASTRODUODENOSCOPY (EGD) WITH PROPOFOL N/A 08/17/2021   Procedure: ESOPHAGOGASTRODUODENOSCOPY (EGD) WITH PROPOFOL;  Surgeon: Lin Landsman, MD;  Location[facility] ENDOSCOPY;  Service: Gastroenterology;  Laterality: N/A;   ESOPHAGOGASTRODUODENOSCOPY (EGD) WITH PROPOFOL N/A 10/14/2021   Procedure: ESOPHAGOGASTRODUODENOSCOPY (EGD) WITH PROPOFOL;  Surgeon: [name], MD;  Location: [facility] ENDOSCOPY;  Service: Endoscopy;  Laterality: N/A;   ESOPHAGOGASTRODUODENOSCOPY (EGD) WITH PROPOFOL N/A 02/01/2022  Procedure: ESOPHAGOGASTRODUODENOSCOPY (EGD) WITH PROPOFOL;  Surgeon: Lin Landsman, MD;  Location: [facility] ENDOSCOPY;  Service: Gastroenterology;  Laterality: N/A;   ESOPHAGOGASTRODUODENOSCOPY (EGD) WITH PROPOFOL N/A 03/15/2022   Procedure: ESOPHAGOGASTRODUODENOSCOPY (EGD) WITH PROPOFOL;  Surgeon: Jonathon Bellows, MD;  Location: [facility]  ENDOSCOPY;  Service: Gastroenterology;  Laterality: N/A;   ESOPHAGOGASTRODUODENOSCOPY (EGD) WITH PROPOFOL N/A 04/18/2022   Procedure: ESOPHAGOGASTRODUODENOSCOPY (EGD) WITH PROPOFOL;  Surgeon: Lin Landsman, MD;  Location: [facility] ENDOSCOPY;  Service: Gastroenterology;  Laterality: N/A;   GIVENS CAPSULE STUDY N/A 06/22/2021   Procedure: GIVENS CAPSULE STUDY;  Surgeon: Toledo, Benay Pike, MD;  Location: [facility] ENDOSCOPY;  Service: Gastroenterology;  Laterality: N/A;   IR NEPHROSTOMY EXCHANGE LEFT  02/25/2022   IR NEPHROSTOMY EXCHANGE LEFT  03/27/2022   IR NEPHROSTOMY PLACEMENT LEFT  12/27/2021   IVC FILTER INSERTION N/A 08/18/2020   Procedure: IVC FILTER INSERTION;  Surgeon: Katha Cabal, MD;  Location: [place] CV LAB;  Service: Cardiovascular;  Laterality: N/A;   PERIPHERAL VASCULAR THROMBECTOMY Bilateral 01/03/2021   Procedure: PERIPHERAL VASCULAR THROMBECTOMY;  Surgeon: Algernon Huxley, MD;  Location: [place] CV LAB;  Service: Cardiovascular;  Laterality: Bilateral;   PULMONARY THROMBECTOMY N/A 08/18/2020   Procedure: PULMONARY THROMBECTOMY;  Surgeon: Katha Cabal, MD;  Location: [place] CV LAB;  Service: Cardiovascular;  Laterality: N/A;   TUBAL LIGATION     VISCERAL ANGIOGRAPHY N/A 07/18/2021   Procedure: VISCERAL ANGIOGRAPHY;  Surgeon: Algernon Huxley, MD;  Location: [place] [facility] LAB;  Service: Cardiovascular;  Laterality: N/A;    SOCIAL HISTORY: Social History   Socioeconomic History   Marital status: Single    Spouse name: Not on file   Number of children: Not on file   Years of education: Not on file   Highest education level: Not on file  Occupational History   Not on file  Tobacco Use   Smoking status: Never   Smokeless tobacco: Never   Tobacco comments:    smoked 2-4 cigarettes for about 2-3 weeks   Vaping Use   Vaping Use: Never used  Substance and Sexual Activity   Alcohol use: Not Currently   Drug use: Not Currently   Sexual activity: Not  Currently  Other Topics Concern   Not on file  Social History Narrative   Not on file   Social Determinants of Health   Financial Resource Strain: Not on file  Food Insecurity: No Food Insecurity (04/17/2022)   Hunger Vital Sign    Worried About Running Out of Food in the Last Year: Never true    Ran Out of Food in the Last Year: Never true  Transportation Needs: No Transportation Needs (04/17/2022)   PRAPARE - Hydrologist (Medical): No    Lack of Transportation (Non-Medical): No  Physical Activity: Not on file  Stress: Not on file  Social Connections: Not on file  Intimate Partner Violence: Not At Risk (04/17/2022)   Humiliation, Afraid, Rape, and Kick questionnaire    Fear of Current or Ex-Partner: No    Emotionally Abused: No    Physically Abused: No    Sexually Abused: No    FAMILY HISTORY: Family History  Problem Relation Age of Onset   Hypertension Mother    Diabetes Mother    Diabetes Father    Hypertension Father    High Cholesterol Father    Congestive Heart Failure Father    Breast cancer Cousin     ALLERGIES:  has No Known Allergies.  MEDICATIONS:  Current Outpatient Medications  Medication Sig Dispense Refill   acetaminophen (TYLENOL) 500 MG tablet Take 500 mg by mouth every 6 (six) hours as needed for mild pain or fever.     Ascorbic Acid (VITAMIN C) 1000 MG tablet Take 1,000 mg by mouth daily.     cyanocobalamin 1000 MCG tablet Take 1 tablet (1,000 mcg total) by mouth daily. 30 tablet 0   fluticasone furoate-vilanterol (BREO ELLIPTA) 100-25 MCG/ACT AEPB Inhale 1 puff into the lungs daily.     furosemide (LASIX) 20 MG tablet Take 1 tablet (20 mg total) by mouth daily. Home med.     midodrine (PROAMATINE) 10 MG tablet Take 1 tablet (10 mg total) by mouth 3 (three) times daily with meals. 90 tablet 2   mometasone-formoterol (DULERA) 100-5 MCG/ACT AERO Inhale 2 puffs into the lungs 2 (two) times daily.     Multiple Vitamin  (MULTIVITAMIN WITH MINERALS) TABS tablet Take 1 tablet by mouth daily.     pantoprazole (PROTONIX) 40 MG tablet Take 1 tablet (40 mg total) by mouth 2 (two) times daily. 60 tablet 1   polyethylene glycol (MIRALAX / GLYCOLAX) 17 g packet Take 17 g by mouth daily as needed for mild constipation. 14 each 0   rosuvastatin (CRESTOR) 10 MG tablet Take 1 tablet (10 mg total) by mouth daily. (Patient taking differently: Take 10 mg by mouth in the morning.) 15 tablet 0   ferrous sulfate 325 (65 FE) MG tablet Take 1 tablet (325 mg total) by mouth 2 (two) times daily with a meal. (Patient not taking: Reported on 05/06/2022) 60 tablet 3   No current facility-administered medications for this visit.     PHYSICAL EXAMINATION: ECOG PERFORMANCE STATUS: 2 - Symptomatic, <50% confined to bed Vitals:   05/06/22 1504  BP: 116/71  Pulse: 79  Temp: (!) 96.7 F (35.9 C)  SpO2: 96%   Filed Weights   05/06/22 1504  Weight: 163 lb (73.9 kg)     Physical Exam Constitutional:      General: She is not in acute distress.    Appearance: She is obese.     Comments: Patient sits in the wheelchair  HENT:     Head: Normocephalic and atraumatic.  Eyes:     General: No scleral icterus. Cardiovascular:     Rate and Rhythm: Normal rate and regular rhythm.     Heart sounds: Normal heart sounds.  Pulmonary:     Effort: Pulmonary effort is normal. No respiratory distress.     Breath sounds: No wheezing.  Abdominal:     General: Bowel sounds are normal. There is no distension.     Palpations: Abdomen is soft.  Musculoskeletal:        General: Swelling present.     Cervical back: Normal range of motion and neck supple.     Comments: Lower extremities 3+ edema bilaterally  Skin:    General: Skin is warm and dry.     Coloration: Skin is not pale.     Findings: No erythema or rash.  Neurological:     Mental Status: She is alert and oriented to person, place, and time. Mental status is at baseline.     Cranial  Nerves: No cranial nerve deficit.     Coordination: Coordination normal.  Psychiatric:        Mood and Affect: Mood normal.     LABORATORY DATA:  I have reviewed the data as listed     Latest Ref  Rng & Units 05/03/2022   11:40 AM 04/20/2022    4:30 AM 04/19/2022    7:00 AM  CBC  WBC 4.0 - 10.5 K/uL 3.8  4.5  5.4   Hemoglobin 12.0 - 15.0 g/dL 10.9  8.8  9.0   Hematocrit 36.0 - 46.0 % 35.7  30.6  30.2   Platelets 150 - 400 K/uL 172  185  187       Latest Ref Rng & Units 05/03/2022   11:40 AM 04/20/2022    4:30 AM 04/19/2022    7:00 AM  CMP  Glucose 70 - 99 mg/dL 107  120  130   BUN 8 - 23 mg/dL 32  15  13   Creatinine 0.44 - 1.00 mg/dL 0.99  0.78  1.01   Sodium 135 - 145 mmol/L 139  138  139   Potassium 3.5 - 5.1 mmol/L 4.0  4.5  5.0   Chloride 98 - 111 mmol/L 101  111  111   CO2 22 - 32 mmol/L '28  22  25   '$ Calcium 8.9 - 10.3 mg/dL 9.3  8.5  8.6   Total Protein 6.5 - 8.1 g/dL 7.5     Total Bilirubin 0.3 - 1.2 mg/dL 0.9     Alkaline Phos 38 - 126 U/L 48     AST 15 - 41 U/L 18     ALT 0 - 44 U/L 14       Iron/TIBC/Ferritin/ %Sat    Component Value Date/Time   IRON 41 05/03/2022 1140   TIBC 318 05/03/2022 1140   FERRITIN 57 05/03/2022 1140   IRONPCTSAT 13 05/03/2022 1140      RADIOGRAPHIC STUDIES: I have personally reviewed the radiological images as listed and agreed with the findings in the report. No results found.

## 2022-05-06 NOTE — Assessment & Plan Note (Signed)
Stage II endometrial cancer with cervical involvement or cervical cancer.  Not a candidate of surgery and chemotherapy.  Status post radiation and brachytherapy. Recommend her to follow up with gynecology oncology for follow-up and pelvic examination.

## 2022-05-17 ENCOUNTER — Ambulatory Visit
Admission: RE | Admit: 2022-05-17 | Discharge: 2022-05-17 | Disposition: A | Discharge status: Home / Self Care | Payer: Medicare HMO | Source: Ambulatory Visit | Attending: Family Medicine | Admitting: Family Medicine

## 2022-05-17 DIAGNOSIS — Z1231 Encounter for screening mammogram for malignant neoplasm of breast: Secondary | ICD-10-CM | POA: Insufficient documentation

## 2022-05-21 NOTE — Progress Notes (Signed)
Patient for IR LT Neph Tube Exchange on Wed 05/22/2022, I called and spoke with the patient on the phone and gave pre-procedure instructions. Pt was made aware to be here at 9:30a at the new entrance. Pt stated understanding. Called 05/21/2022

## 2022-05-22 ENCOUNTER — Ambulatory Visit
Admission: RE | Admit: 2022-05-22 | Discharge: 2022-05-22 | Disposition: A | Discharge status: Home / Self Care | Payer: Medicare HMO | Source: Ambulatory Visit | Attending: Interventional Radiology | Admitting: Interventional Radiology

## 2022-05-22 ENCOUNTER — Other Ambulatory Visit: Payer: Self-pay | Admitting: Interventional Radiology

## 2022-05-22 DIAGNOSIS — Z436 Encounter for attention to other artificial openings of urinary tract: Secondary | ICD-10-CM | POA: Insufficient documentation

## 2022-05-22 DIAGNOSIS — N1330 Unspecified hydronephrosis: Secondary | ICD-10-CM | POA: Insufficient documentation

## 2022-05-22 HISTORY — PX: IR NEPHROSTOMY EXCHANGE LEFT: IMG6069

## 2022-05-22 MED ORDER — LIDOCAINE HCL 1 % IJ SOLN
INTRAMUSCULAR | Status: AC
  Administered 2022-05-22: 1 mL
  Filled 2022-05-22: qty 20

## 2022-05-22 MED ORDER — IOHEXOL 300 MG/ML  SOLN
8.0000 mL | Freq: Once | INTRAMUSCULAR | Status: AC | PRN
  Administered 2022-05-22: 8 mL

## 2022-05-23 NOTE — Progress Notes (Deleted)
05/24/2022 9:28 PM   Courtney Mack Jan 12, 1956 [id number]  Referring provider: [facility], [place],  [place] [id number]  Urological history: 1. High risk hematuria -former smoker -several contrast and non-contrast CT's over the last year -no findings of GU malignancies  -cysto (11/2021) -consistent with radiation changes in the bladder -no reports of gross heme  2. Left hydronephrosis -initially managed w/ left ureteral stent but discontinued due to continuing hematuria -managed with nephrostomy tube last exchanged (03/27/2022) -scheduled for nephrostomy tube exchange (05/22/2022)   3. Nephrolithiasis -stone composition -emergent left stent placement for 2 mm distal stone (09/2021) -stone spontaneously passed after stent placed  4. rUTI's -contributing factors of age, vaginal atrophy, nephrostomy tube, diabetes,  -Documented urine cultures over the last year  March 26, 2022 multiple species  March 13, 2022 Proteus penneri  February 25, 2022 multiple species  February 24, 2022 multiple species  December 27, 2021 Enterococcus faecium and yeast  December 24, 2020 Enterococcus faecium  Nov 16, 2021 no growth  October 17, 2021 no growth  October 08, 2021 multiple species  September 11, 2021 no growth  September 11, 2021 Proteus Mirabilis  August 14, 2021 multiple species   No chief complaint on file.   HPI: Courtney Mack is a 66 y.o. female who presents today for one month follow up after hospitalization.    She was discharged on March 29, 2022 after being treated for complicated UTI, iron deficient anemia, AKI and chronic lower extremities lymphedema with wounds.    At her visit on 04/23/2022, she indicated that she is having 1-7 daytime urinations, 1-2 nighttime urinations with a mild urge to urinate.  She states she is always leaking and wears 6 depends reinforced with 6 pads daily.  She does engage in toilet mapping,  but she does not limit fluid intake.  PVR 8 mL   She had her nephrostomy tube exchanged yesterday.     PMH: Past Medical History:  Diagnosis Date   Acute bilateral deep vein thrombosis (DVT) of femoral veins ([place]) 01/02/2021   Acute massive pulmonary embolism ([place]) 08/25/2020   Last Assessment & Plan:  Formatting of this note might be different from the original. Post vena caval filter and on xarelto and O2   Anemia    ARF (acute respiratory failure) ([place])    Cellulitis of left lower leg 06/20/2021   CHF (congestive heart failure) ([place])    COVID-19    Diabetes mellitus without complication ([place])    DVT (deep venous thrombosis) ([place]) 09/06/2020   Endometrial cancer (HCC)    GAVE (gastric antral vascular ectasia)    GERD (gastroesophageal reflux disease)    High cholesterol    Hx of blood clots    Hyperlipidemia    Hypertension    IDA (iron deficiency anemia) 09/21/2020   Obesity    Pressure injury of skin 06/21/2021   Pulmonary embolism (HCC)    Sepsis ([place])    Thrombocytopenia (HCC)    Urinary tract infection 09/13/2021    Surgical History: Past Surgical History:  Procedure Laterality Date   COLONOSCOPY N/A 06/21/2021   Procedure: COLONOSCOPY;  Surgeon: Toledo, Benay Pike, MD;  Location: [facility] ENDOSCOPY;  Service: Gastroenterology;  Laterality: N/A;   CYSTOSCOPY W/ URETERAL STENT PLACEMENT Left 09/11/2021   Procedure: CYSTOSCOPY WITH RETROGRADE PYELOGRAM/URETERAL STENT PLACEMENT;  Surgeon: Abbie Sons, MD;  Location: [facility] ORS;  Service: Urology;  Laterality: Left;   CYSTOSCOPY W/ URETERAL  STENT PLACEMENT Left 11/17/2021   Procedure: CYSTOSCOPY WITH STENT REPLACEMENT;  Surgeon: Irine Seal, MD;  Location: [facility] ORS;  Service: Urology;  Laterality: Left;   CYSTOSCOPY WITH FULGERATION  11/17/2021   Procedure: CYSTOSCOPY WITH FULGERATION;  Surgeon: Irine Seal, MD;  Location: [facility] ORS;  Service: Urology;;   CYSTOSCOPY WITH STENT PLACEMENT Left 10/17/2021   Procedure: CYSTOSCOPY  WITH STENT PLACEMENT;  Surgeon: Abbie Sons, MD;  Location: [facility] ORS;  Service: Urology;  Laterality: Left;   CYSTOSCOPY WITH URETEROSCOPY, STONE BASKETRY AND STENT PLACEMENT Left 10/16/2021   Procedure: CYSTOSCOPY WITH URETEROSCOPY  AND STENT REMOVAL;  Surgeon: Abbie Sons, MD;  Location: [facility] ORS;  Service: Urology;  Laterality: Left;   ENTEROSCOPY N/A 08/17/2021   Procedure: ENTEROSCOPY;  Surgeon: Lin Landsman, MD;  Location: [facility] ENDOSCOPY;  Service: Gastroenterology;  Laterality: N/A;   ESOPHAGOGASTRODUODENOSCOPY N/A 06/21/2021   Procedure: ESOPHAGOGASTRODUODENOSCOPY (EGD);  Surgeon: Toledo, Benay Pike, MD;  Location: [facility] ENDOSCOPY;  Service: Gastroenterology;  Laterality: N/A;   ESOPHAGOGASTRODUODENOSCOPY (EGD) WITH PROPOFOL N/A 08/17/2021   Procedure: ESOPHAGOGASTRODUODENOSCOPY (EGD) WITH PROPOFOL;  Surgeon: Lin Landsman, MD;  Location: [facility] ENDOSCOPY;  Service: Gastroenterology;  Laterality: N/A;   ESOPHAGOGASTRODUODENOSCOPY (EGD) WITH PROPOFOL N/A 10/14/2021   Procedure: ESOPHAGOGASTRODUODENOSCOPY (EGD) WITH PROPOFOL;  Surgeon: [name], MD;  Location: [facility] ENDOSCOPY;  Service: Endoscopy;  Laterality: N/A;   ESOPHAGOGASTRODUODENOSCOPY (EGD) WITH PROPOFOL N/A 02/01/2022   Procedure: ESOPHAGOGASTRODUODENOSCOPY (EGD) WITH PROPOFOL;  Surgeon: Lin Landsman, MD;  Location: [facility] ENDOSCOPY;  Service: Gastroenterology;  Laterality: N/A;   ESOPHAGOGASTRODUODENOSCOPY (EGD) WITH PROPOFOL N/A 03/15/2022   Procedure: ESOPHAGOGASTRODUODENOSCOPY (EGD) WITH PROPOFOL;  Surgeon: Jonathon Bellows, MD;  Location: [facility] ENDOSCOPY;  Service: Gastroenterology;  Laterality: N/A;   ESOPHAGOGASTRODUODENOSCOPY (EGD) WITH PROPOFOL N/A 04/18/2022   Procedure: ESOPHAGOGASTRODUODENOSCOPY (EGD) WITH PROPOFOL;  Surgeon: Lin Landsman, MD;  Location: [facility] ENDOSCOPY;  Service: Gastroenterology;  Laterality: N/A;   GIVENS CAPSULE STUDY N/A 06/22/2021   Procedure: GIVENS CAPSULE STUDY;  Surgeon:  Toledo, Benay Pike, MD;  Location: [facility] ENDOSCOPY;  Service: Gastroenterology;  Laterality: N/A;   IR NEPHROSTOMY EXCHANGE LEFT  02/25/2022   IR NEPHROSTOMY EXCHANGE LEFT  03/27/2022   IR NEPHROSTOMY EXCHANGE LEFT  05/22/2022   IR NEPHROSTOMY PLACEMENT LEFT  12/27/2021   IVC FILTER INSERTION N/A 08/18/2020   Procedure: IVC FILTER INSERTION;  Surgeon: Katha Cabal, MD;  Location: [place] CV LAB;  Service: Cardiovascular;  Laterality: N/A;   PERIPHERAL VASCULAR THROMBECTOMY Bilateral 01/03/2021   Procedure: PERIPHERAL VASCULAR THROMBECTOMY;  Surgeon: Algernon Huxley, MD;  Location: [place] CV LAB;  Service: Cardiovascular;  Laterality: Bilateral;   PULMONARY THROMBECTOMY N/A 08/18/2020   Procedure: PULMONARY THROMBECTOMY;  Surgeon: Katha Cabal, MD;  Location: [place] CV LAB;  Service: Cardiovascular;  Laterality: N/A;   TUBAL LIGATION     VISCERAL ANGIOGRAPHY N/A 07/18/2021   Procedure: VISCERAL ANGIOGRAPHY;  Surgeon: Algernon Huxley, MD;  Location: [place] CV LAB;  Service: Cardiovascular;  Laterality: N/A;    Home Medications:  Allergies as of 05/24/2022   No Known Allergies      Medication List        Accurate as of May 23, 2022  9:28 PM. If you have any questions, ask your nurse or doctor.          acetaminophen 500 MG tablet Commonly known as: TYLENOL Take 500 mg by mouth every 6 (six) hours as needed for mild pain or fever.   cyanocobalamin 1000 MCG tablet  Take 1 tablet (1,000 mcg total) by mouth daily.   ferrous sulfate 325 (65 FE) MG tablet Take 1 tablet (325 mg total) by mouth 2 (two) times daily with a meal.   fluticasone furoate-vilanterol 100-25 MCG/ACT Aepb Commonly known as: BREO ELLIPTA Inhale 1 puff into the lungs daily.   furosemide 20 MG tablet Commonly known as: Lasix Take 1 tablet (20 mg total) by mouth daily. Home med.   midodrine 10 MG tablet Commonly known as: PROAMATINE Take 1 tablet (10 mg total) by mouth 3 (three)  times daily with meals.   mometasone-formoterol 100-5 MCG/ACT Aero Commonly known as: DULERA Inhale 2 puffs into the lungs 2 (two) times daily.   multivitamin with minerals Tabs tablet Take 1 tablet by mouth daily.   pantoprazole 40 MG tablet Commonly known as: PROTONIX Take 1 tablet (40 mg total) by mouth 2 (two) times daily.   polyethylene glycol 17 g packet Commonly known as: MIRALAX / GLYCOLAX Take 17 g by mouth daily as needed for mild constipation.   rosuvastatin 10 MG tablet Commonly known as: CRESTOR Take 1 tablet (10 mg total) by mouth daily. What changed: when to take this   vitamin C 1000 MG tablet Take 1,000 mg by mouth daily.        Allergies: No Known Allergies  Family History: Family History  Problem Relation Age of Onset   Hypertension Mother    Diabetes Mother    Diabetes Father    Hypertension Father    High Cholesterol Father    Congestive Heart Failure Father    Breast cancer Cousin     Social History:  reports that she has never smoked. She has never used smokeless tobacco. She reports that she does not currently use alcohol. She reports that she does not currently use drugs.  ROS: Pertinent ROS in HPI  Physical Exam: There were no vitals taken for this visit.  Constitutional:  Well nourished. Alert and oriented, No acute distress. HEENT: [place] AT, moist mucus membranes.  Trachea midline Cardiovascular: No clubbing, cyanosis, or edema. Respiratory: Normal respiratory effort, no increased work of breathing. Neurologic: Grossly intact, no focal deficits, moving all 4 extremities. Psychiatric: Normal mood and affect.    Laboratory Data: Lab Results  Component Value Date   WBC 3.8 (L) 05/03/2022   HGB 10.9 (L) 05/03/2022   HCT 35.7 (L) 05/03/2022   MCV 94.4 05/03/2022   PLT 172 05/03/2022    Lab Results  Component Value Date   CREATININE 0.99 05/03/2022    Lab Results  Component Value Date   HGBA1C 5.7 (H) 11/16/2021    Lab  Results  Component Value Date   AST 18 05/03/2022   Lab Results  Component Value Date   ALT 14 05/03/2022  I have reviewed the labs.   Pertinent Imaging:  04/23/22 14:55  Scan Result 8    Assessment & Plan:    1. High risk hematuria -multiple CT's and recent cysto (11/2021) saw changes consistent with radiation  -no reports of gross heme  2. rUTI's. -criteria for recurrent UTI has been met with 2 or more infections in 6 months or 3 or greater infections in one year  -Not a candidate for vaginal estrogen cream due to history of endometrial cancer -continue to keep hydrated  3. Left hydronephrosis -nephrostomy tube in place, draining clear yellow urine -Managed with nephrostomy tube and scheduled for exchange on May 18, 2022  No follow-ups on file.  These notes generated with voice recognition software. I apologize for typographical errors.  [place], [place] [address]  [place], [place] [address] [phone]

## 2022-05-24 ENCOUNTER — Ambulatory Visit: Payer: Medicaid Other | Admitting: Urology

## 2022-05-24 ENCOUNTER — Encounter: Payer: Self-pay | Admitting: Urology

## 2022-05-28 ENCOUNTER — Encounter: Payer: Self-pay | Admitting: Ophthalmology

## 2022-05-29 NOTE — Discharge Instructions (Signed)

## 2022-06-04 ENCOUNTER — Encounter: Payer: Self-pay | Admitting: Ophthalmology

## 2022-06-04 ENCOUNTER — Other Ambulatory Visit: Payer: Self-pay

## 2022-06-04 ENCOUNTER — Encounter
Admission: RE | Disposition: A | Discharge status: Home / Self Care | Payer: Self-pay | Source: Home / Self Care | Attending: Ophthalmology

## 2022-06-04 ENCOUNTER — Ambulatory Visit: Payer: Medicare HMO | Admitting: General Practice

## 2022-06-04 ENCOUNTER — Ambulatory Visit
Admission: RE | Admit: 2022-06-04 | Discharge: 2022-06-04 | Disposition: A | Discharge status: Home / Self Care | Payer: Medicare HMO | Attending: Ophthalmology | Admitting: Ophthalmology

## 2022-06-04 DIAGNOSIS — Z87891 Personal history of nicotine dependence: Secondary | ICD-10-CM | POA: Diagnosis not present

## 2022-06-04 DIAGNOSIS — I509 Heart failure, unspecified: Secondary | ICD-10-CM | POA: Insufficient documentation

## 2022-06-04 DIAGNOSIS — I110 Hypertensive heart disease with heart failure: Secondary | ICD-10-CM | POA: Insufficient documentation

## 2022-06-04 DIAGNOSIS — E785 Hyperlipidemia, unspecified: Secondary | ICD-10-CM

## 2022-06-04 DIAGNOSIS — E1136 Type 2 diabetes mellitus with diabetic cataract: Secondary | ICD-10-CM | POA: Diagnosis not present

## 2022-06-04 DIAGNOSIS — H2511 Age-related nuclear cataract, right eye: Secondary | ICD-10-CM | POA: Diagnosis not present

## 2022-06-04 DIAGNOSIS — K219 Gastro-esophageal reflux disease without esophagitis: Secondary | ICD-10-CM | POA: Diagnosis not present

## 2022-06-04 HISTORY — PX: CATARACT EXTRACTION W/PHACO: SHX586

## 2022-06-04 SURGERY — PHACOEMULSIFICATION, CATARACT, WITH IOL INSERTION
Anesthesia: Monitor Anesthesia Care | Site: Eye | Laterality: Right

## 2022-06-04 MED ORDER — FENTANYL CITRATE PF 50 MCG/ML IJ SOSY: 25.0000 ug | PREFILLED_SYRINGE | INTRAMUSCULAR | Status: DC | PRN

## 2022-06-04 MED ORDER — ARMC OPHTHALMIC DILATING DROPS
1.0000 | OPHTHALMIC | Status: DC | PRN
  Administered 2022-06-04 (×3): 1 via OPHTHALMIC

## 2022-06-04 MED ORDER — TETRACAINE HCL 0.5 % OP SOLN
1.0000 [drp] | OPHTHALMIC | Status: DC | PRN
  Administered 2022-06-04 (×3): 1 [drp] via OPHTHALMIC

## 2022-06-04 MED ORDER — SIGHTPATH DOSE#1 BSS IO SOLN
INTRAOCULAR | Status: DC | PRN
  Administered 2022-06-04: 96 mL via OPHTHALMIC

## 2022-06-04 MED ORDER — SIGHTPATH DOSE#1 BSS IO SOLN
INTRAOCULAR | Status: DC | PRN
  Administered 2022-06-04: 1 mL via INTRAMUSCULAR

## 2022-06-04 MED ORDER — FENTANYL CITRATE (PF) 100 MCG/2ML IJ SOLN
INTRAMUSCULAR | Status: DC | PRN
  Administered 2022-06-04: 25 ug via INTRAVENOUS

## 2022-06-04 MED ORDER — ONDANSETRON HCL 4 MG/2ML IJ SOLN: 4.0000 mg | Freq: Once | INTRAMUSCULAR | Status: DC | PRN

## 2022-06-04 MED ORDER — MOXIFLOXACIN HCL 0.5 % OP SOLN
OPHTHALMIC | Status: DC | PRN
  Administered 2022-06-04: .2 mL via OPHTHALMIC

## 2022-06-04 MED ORDER — SIGHTPATH DOSE#1 NA CHONDROIT SULF-NA HYALURON 40-17 MG/ML IO SOLN
INTRAOCULAR | Status: DC | PRN
  Administered 2022-06-04: 1 mL via INTRAOCULAR

## 2022-06-04 MED ORDER — BRIMONIDINE TARTRATE-TIMOLOL 0.2-0.5 % OP SOLN
OPHTHALMIC | Status: DC | PRN
  Administered 2022-06-04: 1 [drp] via OPHTHALMIC

## 2022-06-04 MED ORDER — SIGHTPATH DOSE#1 BSS IO SOLN
INTRAOCULAR | Status: DC | PRN
  Administered 2022-06-04: 15 mL

## 2022-06-04 MED ORDER — LACTATED RINGERS IV SOLN: INTRAVENOUS | Status: DC

## 2022-06-04 SURGICAL SUPPLY — 9 items
CATARACT SUITE SIGHTPATH (MISCELLANEOUS) ×1 IMPLANT
FEE CATARACT SUITE SIGHTPATH (MISCELLANEOUS) ×1 IMPLANT
GLOVE SURG ENC TEXT LTX SZ8 (GLOVE) ×1 IMPLANT
GLOVE SURG TRIUMPH 8.0 PF LTX (GLOVE) ×1 IMPLANT
LENS IOL TECNIS EYHANCE 24.5 (Intraocular Lens) IMPLANT
NDL FILTER BLUNT 18X1 1/2 (NEEDLE) ×1 IMPLANT
NEEDLE FILTER BLUNT 18X1 1/2 (NEEDLE) ×1 IMPLANT
SYR 3ML LL SCALE MARK (SYRINGE) ×1 IMPLANT
WATER STERILE IRR 250ML POUR (IV SOLUTION) ×1 IMPLANT

## 2022-06-04 NOTE — Op Note (Signed)
PREOPERATIVE DIAGNOSIS:  Nuclear sclerotic cataract of the right eye.   POSTOPERATIVE DIAGNOSIS:  H25.11 NUCLEAR CATARACTH25.89 TOTAL CATARACT   OPERATIVE PROCEDURE:ORPROCALL@   SURGEON:  Birder Robson, MD.   ANESTHESIA:  Anesthesiologist: Molli Barrows, MD CRNA: Tobie Poet, CRNA  1.      Managed anesthesia care. 2.      0.30m of Shugarcaine was instilled in the eye following the paracentesis.   COMPLICATIONS:  None.   TECHNIQUE:   Stop and chop   DESCRIPTION OF PROCEDURE:  The patient was examined and consented in the preoperative holding area where the aforementioned topical anesthesia was applied to the right eye and then brought back to the Operating Room where the right eye was prepped and draped in the usual sterile ophthalmic fashion and a lid speculum was placed. A paracentesis was created with the side port blade and the anterior chamber was filled with viscoelastic. A near clear corneal incision was performed with the steel keratome. A continuous curvilinear capsulorrhexis was performed with a cystotome followed by the capsulorrhexis forceps. Hydrodissection and hydrodelineation were carried out with BSS on a blunt cannula. The lens was removed in a stop and chop  technique and the remaining cortical material was removed with the irrigation-aspiration handpiece. The capsular bag was inflated with viscoelastic and the Technis [id number]  lens was placed in the capsular bag without complication. The remaining viscoelastic was removed from the eye with the irrigation-aspiration handpiece. The wounds were hydrated. The anterior chamber was flushed with BSS and the eye was inflated to physiologic pressure. 0.149mof Vigamox was placed in the anterior chamber. The wounds were found to be water tight. The eye was dressed with Combigan. The patient was given protective glasses to wear throughout the day and a shield with which to sleep tonight. The patient was also given drops with which  to begin a drop regimen today and will follow-up with me in one day. Implant Name Type Inv. Item Serial No. Manufacturer Lot No. LRB No. Used Action  LENS IOL TECNIS EYHANCE 24.5 - [id number] Lens LENS IOL TECNIS EYHANCE 24.5 [id number]  Right 1 Implanted   Procedure(s): CATARACT EXTRACTION PHACO AND INTRAOCULAR LENS PLACEMENT (IOC) COMPLICATED RIGHT  3110.2702:17.4 (Right)  Electronically signed: WiBirder Robson1/28/2023 12:51 PM

## 2022-06-04 NOTE — H&P (Signed)
[facility]   Primary Care Physician:  [facility], [facility] Ophthalmologist: Dr. Hortense Ramal  Pre-Procedure History & Physical: HPI:  Courtney Mack is a 66 y.o. female here for cataract surgery.   Past Medical History:  Diagnosis Date   Acute bilateral deep vein thrombosis (DVT) of femoral veins ([place]) 01/02/2021   Acute massive pulmonary embolism ([place]) 08/25/2020   Last Assessment & Plan:  Formatting of this note might be different from the original. Post vena caval filter and on xarelto and O2   Anemia    ARF (acute respiratory failure) ([place])    Cellulitis of left lower leg 06/20/2021   CHF (congestive heart failure) ([place])    COVID-19    Diabetes mellitus without complication ([place])    DVT (deep venous thrombosis) ([place]) 09/06/2020   Endometrial cancer (HCC)    GAVE (gastric antral vascular ectasia)    GERD (gastroesophageal reflux disease)    High cholesterol    Hx of blood clots    Hyperlipidemia    Hypertension    IDA (iron deficiency anemia) 09/21/2020   Obesity    Pressure injury of skin 06/21/2021   Pulmonary embolism ([place])    Sepsis ([place])    Thrombocytopenia ([place])    Urinary tract infection 09/13/2021    Past Surgical History:  Procedure Laterality Date   COLONOSCOPY N/A 06/21/2021   Procedure: COLONOSCOPY;  Surgeon: Toledo, Benay Pike, MD;  Location: [facility] ENDOSCOPY;  Service: Gastroenterology;  Laterality: N/A;   CYSTOSCOPY W/ URETERAL STENT PLACEMENT Left 09/11/2021   Procedure: CYSTOSCOPY WITH RETROGRADE PYELOGRAM/URETERAL STENT PLACEMENT;  Surgeon: Abbie Sons, MD;  Location: [facility] ORS;  Service: Urology;  Laterality: Left;   CYSTOSCOPY W/ URETERAL STENT PLACEMENT Left 11/17/2021   Procedure: CYSTOSCOPY WITH STENT REPLACEMENT;  Surgeon: Irine Seal, MD;  Location: [facility] ORS;  Service: Urology;  Laterality: Left;   CYSTOSCOPY WITH FULGERATION  11/17/2021   Procedure: CYSTOSCOPY WITH FULGERATION;  Surgeon: Irine Seal, MD;  Location: [facility]  ORS;  Service: Urology;;   CYSTOSCOPY WITH STENT PLACEMENT Left 10/17/2021   Procedure: CYSTOSCOPY WITH STENT PLACEMENT;  Surgeon: Abbie Sons, MD;  Location: [facility] ORS;  Service: Urology;  Laterality: Left;   CYSTOSCOPY WITH URETEROSCOPY, STONE BASKETRY AND STENT PLACEMENT Left 10/16/2021   Procedure: CYSTOSCOPY WITH URETEROSCOPY  AND STENT REMOVAL;  Surgeon: Abbie Sons, MD;  Location: [facility] ORS;  Service: Urology;  Laterality: Left;   ENTEROSCOPY N/A 08/17/2021   Procedure: ENTEROSCOPY;  Surgeon: Lin Landsman, MD;  Location: [place] ENDOSCOPY;  Service: Gastroenterology;  Laterality: N/A;   ESOPHAGOGASTRODUODENOSCOPY N/A 06/21/2021   Procedure: ESOPHAGOGASTRODUODENOSCOPY (EGD);  Surgeon: Toledo, Benay Pike, MD;  Location: [facility] ENDOSCOPY;  Service: Gastroenterology;  Laterality: N/A;   ESOPHAGOGASTRODUODENOSCOPY (EGD) WITH PROPOFOL N/A 08/17/2021   Procedure: ESOPHAGOGASTRODUODENOSCOPY (EGD) WITH PROPOFOL;  Surgeon: Lin Landsman, MD;  Location: [facility] ENDOSCOPY;  Service: Gastroenterology;  Laterality: N/A;   ESOPHAGOGASTRODUODENOSCOPY (EGD) WITH PROPOFOL N/A 10/14/2021   Procedure: ESOPHAGOGASTRODUODENOSCOPY (EGD) WITH PROPOFOL;  Surgeon: [name], MD;  Location: [facility] ENDOSCOPY;  Service: Endoscopy;  Laterality: N/A;   ESOPHAGOGASTRODUODENOSCOPY (EGD) WITH PROPOFOL N/A 02/01/2022   Procedure: ESOPHAGOGASTRODUODENOSCOPY (EGD) WITH PROPOFOL;  Surgeon: Lin Landsman, MD;  Location: [facility] ENDOSCOPY;  Service: Gastroenterology;  Laterality: N/A;   ESOPHAGOGASTRODUODENOSCOPY (EGD) WITH PROPOFOL N/A 03/15/2022   Procedure: ESOPHAGOGASTRODUODENOSCOPY (EGD) WITH PROPOFOL;  Surgeon: Jonathon Bellows, MD;  Location: [facility] ENDOSCOPY;  Service: Gastroenterology;  Laterality: N/A;   ESOPHAGOGASTRODUODENOSCOPY (EGD) WITH PROPOFOL N/A 04/18/2022   Procedure: ESOPHAGOGASTRODUODENOSCOPY (  EGD) WITH PROPOFOL;  Surgeon: Lin Landsman, MD;  Location: [facility] ENDOSCOPY;  Service: Gastroenterology;   Laterality: N/A;   GIVENS CAPSULE STUDY N/A 06/22/2021   Procedure: GIVENS CAPSULE STUDY;  Surgeon: Toledo, Benay Pike, MD;  Location: [facility] ENDOSCOPY;  Service: Gastroenterology;  Laterality: N/A;   IR NEPHROSTOMY EXCHANGE LEFT  02/25/2022   IR NEPHROSTOMY EXCHANGE LEFT  03/27/2022   IR NEPHROSTOMY EXCHANGE LEFT  05/22/2022   IR NEPHROSTOMY PLACEMENT LEFT  12/27/2021   IVC FILTER INSERTION N/A 08/18/2020   Procedure: IVC FILTER INSERTION;  Surgeon: Katha Cabal, MD;  Location: [place] CV LAB;  Service: Cardiovascular;  Laterality: N/A;   PERIPHERAL VASCULAR THROMBECTOMY Bilateral 01/03/2021   Procedure: PERIPHERAL VASCULAR THROMBECTOMY;  Surgeon: Algernon Huxley, MD;  Location: [place] CV LAB;  Service: Cardiovascular;  Laterality: Bilateral;   PULMONARY THROMBECTOMY N/A 08/18/2020   Procedure: PULMONARY THROMBECTOMY;  Surgeon: Katha Cabal, MD;  Location: [place] CV LAB;  Service: Cardiovascular;  Laterality: N/A;   TUBAL LIGATION     VISCERAL ANGIOGRAPHY N/A 07/18/2021   Procedure: VISCERAL ANGIOGRAPHY;  Surgeon: Algernon Huxley, MD;  Location: [place] CV LAB;  Service: Cardiovascular;  Laterality: N/A;    Prior to Admission medications   Medication Sig Start Date End Date Taking? Authorizing Provider  acetaminophen (TYLENOL) 500 MG tablet Take 500 mg by mouth every 6 (six) hours as needed for mild pain or fever.   Yes [provider]  Ascorbic Acid (VITAMIN C) 1000 MG tablet Take 1,000 mg by mouth daily.   Yes [provider]  ferrous sulfate 325 (65 FE) MG tablet Take 1 tablet (325 mg total) by mouth 2 (two) times daily with a meal. 03/16/22  Yes Lorella Nimrod, MD  fluticasone furoate-vilanterol (BREO ELLIPTA) 100-25 MCG/ACT AEPB Inhale 1 puff into the lungs daily.   Yes [provider]  furosemide (LASIX) 20 MG tablet Take 1 tablet (20 mg total) by mouth daily. Home med. 04/20/22  Yes Enzo Bi, MD  midodrine (PROAMATINE) 10 MG tablet Take  1 tablet (10 mg total) by mouth 3 (three) times daily with meals. 04/20/22 07/19/22 Yes Enzo Bi, MD  Multiple Vitamin (MULTIVITAMIN WITH MINERALS) TABS tablet Take 1 tablet by mouth daily. 09/12/20  Yes [name], MD  pantoprazole (PROTONIX) 40 MG tablet Take 1 tablet (40 mg total) by mouth 2 (two) times daily. 03/01/22 05/28/22 Yes Sreenath, Sudheer B, MD  polyethylene glycol (MIRALAX / GLYCOLAX) 17 g packet Take 17 g by mouth daily as needed for mild constipation. 03/29/22  Yes Lavina Hamman, MD  rosuvastatin (CRESTOR) 10 MG tablet Take 1 tablet (10 mg total) by mouth daily. Patient taking differently: Take 10 mg by mouth in the morning. 10/03/20  Yes Earlie Server, MD    Allergies as of 05/08/2022   (No Known Allergies)    Family History  Problem Relation Age of Onset   Hypertension Mother    Diabetes Mother    Diabetes Father    Hypertension Father    High Cholesterol Father    Congestive Heart Failure Father    Breast cancer Cousin     Social History   Socioeconomic History   Marital status: Single    Spouse name: Not on file   Number of children: Not on file   Years of education: Not on file   Highest education level: Not on file  Occupational History   Not on file  Tobacco Use   Smoking  status: Never   Smokeless tobacco: Never   Tobacco comments:    smoked 2-4 cigarettes for about 2-3 weeks   Vaping Use   Vaping Use: Never used  Substance and Sexual Activity   Alcohol use: Not Currently   Drug use: Not Currently   Sexual activity: Not Currently  Other Topics Concern   Not on file  Social History Narrative   Not on file   Social Determinants of Health   Financial Resource Strain: Not on file  Food Insecurity: No Food Insecurity (04/17/2022)   Hunger Vital Sign    Worried About Running Out of Food in the Last Year: Never true    Ran Out of Food in the Last Year: Never true  Transportation Needs: No Transportation Needs (04/17/2022)   PRAPARE -  Hydrologist (Medical): No    Lack of Transportation (Non-Medical): No  Physical Activity: Not on file  Stress: Not on file  Social Connections: Not on file  Intimate Partner Violence: Not At Risk (04/17/2022)   Humiliation, Afraid, Rape, and Kick questionnaire    Fear of Current or Ex-Partner: No    Emotionally Abused: No    Physically Abused: No    Sexually Abused: No    Review of Systems: See HPI, otherwise negative ROS  Physical Exam: BP (!) 134/57   Temp 98 F (36.7 C) (Tympanic)   Resp (!) 24   Ht 4' 11.02" (1.499 m)   Wt 73.9 kg   SpO2 92%   BMI 32.90 kg/m  General:   Alert, cooperative in NAD Head:  Normocephalic and atraumatic. Respiratory:  Normal work of breathing. Cardiovascular:  RRR  Impression/Plan: Courtney Mack is here for cataract surgery.  Risks, benefits, limitations, and alternatives regarding cataract surgery have been reviewed with the patient.  Questions have been answered.  All parties agreeable.   Birder Robson, MD  06/04/2022, 12:19 PM

## 2022-06-04 NOTE — Anesthesia Preprocedure Evaluation (Signed)
Anesthesia Evaluation  Patient identified by MRN, date of birth, ID band Patient awake    Reviewed: Allergy & Precautions, H&P , NPO status , Patient's Chart, lab work & pertinent test results, reviewed documented beta blocker date and time   Airway Mallampati: II  TM Distance: >3 FB Neck ROM: full    Dental no notable dental hx. (+) Teeth Intact   Pulmonary neg pulmonary ROS   Pulmonary exam normal breath sounds clear to auscultation       Cardiovascular Exercise Tolerance: Good hypertension, On Medications +CHF   Rhythm:regular Rate:Normal     Neuro/Psych negative neurological ROS  negative psych ROS   GI/Hepatic Neg liver ROS,GERD  Medicated,,  Endo/Other  negative endocrine ROSdiabetes, Well Controlled    Renal/GU Renal disease     Musculoskeletal   Abdominal   Peds  Hematology  (+) Blood dyscrasia, anemia   Anesthesia Other Findings   Reproductive/Obstetrics negative OB ROS                             Anesthesia Physical Anesthesia Plan  ASA: 3  Anesthesia Plan: MAC   Post-op Pain Management:    Induction:   PONV Risk Score and Plan:   Airway Management Planned:   Additional Equipment:   Intra-op Plan:   Post-operative Plan:   Informed Consent: I have reviewed the patients History and Physical, chart, labs and discussed the procedure including the risks, benefits and alternatives for the proposed anesthesia with the patient or authorized representative who has indicated his/her understanding and acceptance.       Plan Discussed with: CRNA  Anesthesia Plan Comments:        Anesthesia Quick Evaluation

## 2022-06-04 NOTE — Transfer of Care (Signed)
Immediate Anesthesia Transfer of Care Note  Patient: Courtney Mack  Procedure(s) Performed: CATARACT EXTRACTION PHACO AND INTRAOCULAR LENS PLACEMENT (IOC) COMPLICATED RIGHT  67.73  02:17.4 (Right: Eye)  Patient Location: PACU  Anesthesia Type: MAC  Level of Consciousness: awake, alert  and patient cooperative  Airway and Oxygen Therapy: Patient Spontanous Breathing and Patient connected to supplemental oxygen  Post-op Assessment: Post-op Vital signs reviewed, Patient's Cardiovascular Status Stable, Respiratory Function Stable, Patent Airway and No signs of Nausea or vomiting  Post-op Vital Signs: Reviewed and stable  Complications: No notable events documented.

## 2022-06-05 ENCOUNTER — Encounter: Payer: Self-pay | Admitting: Ophthalmology

## 2022-06-05 NOTE — Anesthesia Postprocedure Evaluation (Signed)
Anesthesia Post Note  Patient: Courtney Mack  Procedure(s) Performed: CATARACT EXTRACTION PHACO AND INTRAOCULAR LENS PLACEMENT (IOC) COMPLICATED RIGHT  33.43  02:17.4 (Right: Eye)  Patient location during evaluation: PACU Anesthesia Type: MAC Level of consciousness: awake and alert Pain management: pain level controlled Vital Signs Assessment: post-procedure vital signs reviewed and stable Respiratory status: spontaneous breathing, nonlabored ventilation, respiratory function stable and patient connected to nasal cannula oxygen Cardiovascular status: blood pressure returned to baseline and stable Postop Assessment: no apparent nausea or vomiting Anesthetic complications: no   No notable events documented.   Last Vitals:  Vitals:   06/04/22 1252 06/04/22 1257  BP: (!) 97/50 (!) 96/53  Pulse: (!) 105 (!) 102  Resp: (!) 25 20  Temp: (!) 36.2 C (!) 36.2 C  SpO2: 93% 96%    Last Pain:  Vitals:   06/04/22 1257  TempSrc:   PainSc: 0-No pain                 Molli Barrows

## 2022-06-07 ENCOUNTER — Encounter: Payer: Self-pay | Admitting: Oncology

## 2022-06-10 ENCOUNTER — Encounter: Payer: Self-pay | Admitting: Ophthalmology

## 2022-06-10 ENCOUNTER — Encounter: Payer: Self-pay | Admitting: Anesthesiology

## 2022-06-11 ENCOUNTER — Encounter: Payer: Self-pay | Admitting: Emergency Medicine

## 2022-06-11 ENCOUNTER — Other Ambulatory Visit: Payer: Self-pay

## 2022-06-11 ENCOUNTER — Emergency Department: Payer: Medicare HMO

## 2022-06-11 ENCOUNTER — Inpatient Hospital Stay
Admission: EM | Admit: 2022-06-11 | Discharge: 2022-06-14 | DRG: 871 | Disposition: A | Discharge status: Home Health | Payer: Medicare HMO | Attending: Hospitalist | Admitting: Hospitalist

## 2022-06-11 DIAGNOSIS — Z83438 Family history of other disorder of lipoprotein metabolism and other lipidemia: Secondary | ICD-10-CM

## 2022-06-11 DIAGNOSIS — Z79899 Other long term (current) drug therapy: Secondary | ICD-10-CM

## 2022-06-11 DIAGNOSIS — R531 Weakness: Secondary | ICD-10-CM | POA: Diagnosis not present

## 2022-06-11 DIAGNOSIS — J189 Pneumonia, unspecified organism: Secondary | ICD-10-CM

## 2022-06-11 DIAGNOSIS — Z9841 Cataract extraction status, right eye: Secondary | ICD-10-CM

## 2022-06-11 DIAGNOSIS — Z936 Other artificial openings of urinary tract status: Secondary | ICD-10-CM

## 2022-06-11 DIAGNOSIS — E785 Hyperlipidemia, unspecified: Secondary | ICD-10-CM | POA: Diagnosis present

## 2022-06-11 DIAGNOSIS — Z8249 Family history of ischemic heart disease and other diseases of the circulatory system: Secondary | ICD-10-CM

## 2022-06-11 DIAGNOSIS — Z961 Presence of intraocular lens: Secondary | ICD-10-CM | POA: Diagnosis present

## 2022-06-11 DIAGNOSIS — A419 Sepsis, unspecified organism: Principal | ICD-10-CM | POA: Diagnosis present

## 2022-06-11 DIAGNOSIS — E7800 Pure hypercholesterolemia, unspecified: Secondary | ICD-10-CM | POA: Diagnosis present

## 2022-06-11 DIAGNOSIS — Z86711 Personal history of pulmonary embolism: Secondary | ICD-10-CM

## 2022-06-11 DIAGNOSIS — I5032 Chronic diastolic (congestive) heart failure: Secondary | ICD-10-CM | POA: Diagnosis present

## 2022-06-11 DIAGNOSIS — Z87440 Personal history of urinary (tract) infections: Secondary | ICD-10-CM

## 2022-06-11 DIAGNOSIS — D696 Thrombocytopenia, unspecified: Secondary | ICD-10-CM | POA: Diagnosis present

## 2022-06-11 DIAGNOSIS — Z8616 Personal history of COVID-19: Secondary | ICD-10-CM

## 2022-06-11 DIAGNOSIS — Z803 Family history of malignant neoplasm of breast: Secondary | ICD-10-CM

## 2022-06-11 DIAGNOSIS — D649 Anemia, unspecified: Secondary | ICD-10-CM | POA: Diagnosis present

## 2022-06-11 DIAGNOSIS — Z833 Family history of diabetes mellitus: Secondary | ICD-10-CM

## 2022-06-11 DIAGNOSIS — I110 Hypertensive heart disease with heart failure: Secondary | ICD-10-CM | POA: Diagnosis present

## 2022-06-11 DIAGNOSIS — Z1152 Encounter for screening for COVID-19: Secondary | ICD-10-CM

## 2022-06-11 DIAGNOSIS — Z8542 Personal history of malignant neoplasm of other parts of uterus: Secondary | ICD-10-CM

## 2022-06-11 DIAGNOSIS — E119 Type 2 diabetes mellitus without complications: Secondary | ICD-10-CM | POA: Diagnosis present

## 2022-06-11 DIAGNOSIS — Z86718 Personal history of other venous thrombosis and embolism: Secondary | ICD-10-CM

## 2022-06-11 DIAGNOSIS — K219 Gastro-esophageal reflux disease without esophagitis: Secondary | ICD-10-CM

## 2022-06-11 HISTORY — DX: Pneumonia, unspecified organism: J18.9

## 2022-06-11 LAB — CBC
HCT: 27.3 % — ABNORMAL LOW (ref 36.0–46.0)
Hemoglobin: 8.4 g/dL — ABNORMAL LOW (ref 12.0–15.0)
MCH: 27.8 pg (ref 26.0–34.0)
MCHC: 30.8 g/dL (ref 30.0–36.0)
MCV: 90.4 fL (ref 80.0–100.0)
Platelets: 461 10*3/uL — ABNORMAL HIGH (ref 150–400)
RBC: 3.02 MIL/uL — ABNORMAL LOW (ref 3.87–5.11)
RDW: 17 % — ABNORMAL HIGH (ref 11.5–15.5)
WBC: 10.8 10*3/uL — ABNORMAL HIGH (ref 4.0–10.5)
nRBC: 0 % (ref 0.0–0.2)

## 2022-06-11 LAB — RESP PANEL BY RT-PCR (FLU A&B, COVID) ARPGX2
Influenza A by PCR: NEGATIVE
Influenza B by PCR: NEGATIVE
SARS Coronavirus 2 by RT PCR: NEGATIVE

## 2022-06-11 LAB — BASIC METABOLIC PANEL
Anion gap: 6 (ref 5–15)
BUN: 15 mg/dL (ref 8–23)
CO2: 23 mmol/L (ref 22–32)
Calcium: 8.9 mg/dL (ref 8.9–10.3)
Chloride: 108 mmol/L (ref 98–111)
Creatinine, Ser: 0.74 mg/dL (ref 0.44–1.00)
GFR, Estimated: >60 mL/min (ref >60)
Glucose, Bld: 109 mg/dL — ABNORMAL HIGH (ref 70–99)
Potassium: 4 mmol/L (ref 3.5–5.1)
Sodium: 137 mmol/L (ref 135–145)

## 2022-06-11 MED ORDER — SODIUM CHLORIDE 0.9 % IV SOLN
500.0000 mg | Freq: Once | INTRAVENOUS | Status: AC
  Administered 2022-06-11: 500 mg via INTRAVENOUS
  Filled 2022-06-11: qty 5

## 2022-06-11 MED ORDER — SODIUM CHLORIDE 0.9 % IV SOLN
1.0000 g | Freq: Once | INTRAVENOUS | Status: AC
  Administered 2022-06-11: 1 g via INTRAVENOUS
  Filled 2022-06-11: qty 10

## 2022-06-11 MED ORDER — SODIUM CHLORIDE 0.9 % IV BOLUS
500.0000 mL | Freq: Once | INTRAVENOUS | Status: AC
  Administered 2022-06-11: 500 mL via INTRAVENOUS

## 2022-06-11 NOTE — ED Triage Notes (Signed)
First Nurse: Pt here via [facility] with a cough. Pt had eye surgery last Mon. Pt has been taking Coridcen with no relief. Pt sputum has been clear. Pt has a foley catheter, urine in bag is dark and pt has a foul odor per ems staff.  112/65 95% 1L 106 SR 29-co2 30 RR

## 2022-06-11 NOTE — ED Provider Notes (Signed)
[facility] Provider Note  Patient Contact: 7:44 PM (approximate)   History   Cough   HPI  Courtney Mack is a 66 y.o. female who presents the emergency department with concern for possible pneumonia and a possible UTI.  Patient presents to the ED with a week of coughing.  No significant shortness of breath.  She denies fevers.  Patient states that she does feel weak but no headache, unilateral weakness, dizziness.  Patient has an indwelling Foley cath and she says that the urine is now very cloudy and looks like it has some pus in it.  She is concerned that she may have pneumonia and a UTI.  Patient denies any chest pain.  No shortness of breath.  No abdominal pain.  No flank pain.  Patient does have a history of recurrent UTIs.  Patient has been coughing at home and while obtaining history patient had a productive cough.     Physical Exam   Triage Vital Signs: ED Triage Vitals  Enc Vitals Group     BP 06/11/22 1533 121/63     Pulse Rate 06/11/22 1533 (!) 117     Resp 06/11/22 1533 20     Temp 06/11/22 1533 98.2 F (36.8 C)     Temp Source 06/11/22 1533 Oral     SpO2 06/11/22 1533 92 %     Weight --      Height --      Head Circumference --      Peak Flow --      Pain Score 06/11/22 1534 9     Pain Loc --      Pain Edu? --      Excl. in [place]? --     Most recent vital signs: Vitals:   06/11/22 1949 06/11/22 2332  BP: (!) 107/55 (!) 106/53  Pulse: 88 90  Resp: 20 19  Temp: 99.8 F (37.7 C) 99.5 F (37.5 C)  SpO2: 95% 94%     General: Alert and in no acute distress. ENT:      Ears:       Nose: No congestion/rhinnorhea.      Mouth/Throat: Mucous membranes are moist. Neck: No stridor. No cervical spine tenderness to palpation. Hematological/Lymphatic/Immunilogical: No cervical lymphadenopathy. Cardiovascular:  Good peripheral perfusion Respiratory: Normal respiratory effort without tachypnea or retractions. Lungs with mild rhonchi  bilateral.  No wheezing.  No rales.  Good air entry to the bases with no decreased or absent breath sounds. Gastrointestinal: Bowel sounds 4 quadrants. Soft and nontender to palpation. No guarding or rigidity. No palpable masses. No distention. No CVA tenderness. Musculoskeletal: Full range of motion to all extremities.  Neurologic:  No gross focal neurologic deficits are appreciated.  Skin:   No rash noted Other:   ED Results / Procedures / Treatments   Labs (all labs ordered are listed, but only abnormal results are displayed) Labs Reviewed  BASIC METABOLIC PANEL - Abnormal; Notable for the following components:      Result Value   Glucose, Bld 109 (*)    All other components within normal limits  CBC - Abnormal; Notable for the following components:   WBC 10.8 (*)    RBC 3.02 (*)    Hemoglobin 8.4 (*)    HCT 27.3 (*)    RDW 17.0 (*)    Platelets 461 (*)    All other components within normal limits  RESP PANEL BY RT-PCR (FLU A&B, COVID) ARPGX2  URINALYSIS, ROUTINE  W REFLEX MICROSCOPIC     EKG     RADIOLOGY  I personally viewed, evaluated, and interpreted these images as part of my medical decision making, as well as reviewing the written report by the radiologist.  ED Provider Interpretation: Findings concerning for infiltrates concerning for multifocal pneumonia.  DG Chest 2 View  Result Date: 06/11/2022 CLINICAL DATA:  Cough, shortness of breath EXAM: CHEST - 2 VIEW COMPARISON:  03/12/2022 FINDINGS: Cardiac size is within normal limits. There are no signs of pulmonary edema. New patchy infiltrates are seen in the medial aspects of both lower lung fields. There is no pleural effusion or pneumothorax. IMPRESSION: New patchy infiltrates are seen in the posterior aspects of both lower lung fields suggesting multifocal pneumonia. Electronically Signed   By: Elmer Picker M.D.   On: 06/11/2022 16:31    PROCEDURES:  Critical Care performed:  No  Procedures   MEDICATIONS ORDERED IN ED: Medications  cefTRIAXone (ROCEPHIN) 1 g in sodium chloride 0.9 % 100 mL IVPB (0 g Intravenous Stopped 06/11/22 2056)  azithromycin (ZITHROMAX) 500 mg in sodium chloride 0.9 % 250 mL IVPB (0 mg Intravenous Stopped 06/11/22 2201)  sodium chloride 0.9 % bolus 500 mL (0 mLs Intravenous Stopped 06/11/22 2140)     IMPRESSION / MDM / ASSESSMENT AND PLAN / ED COURSE  I reviewed the triage vital signs and the nursing notes.                              Differential diagnosis includes, but is not limited to, pneumonia, COVID, viral illness, flu, UTI, pyelonephritis  Patient's presentation is most consistent with acute presentation with potential threat to life or bodily function.   Patient presented to the emergency department with concerns for possible UTI and possible pneumonia.  Patient has had a cough times a week.  No fevers or chills, nasal congestion, chest pain or difficulty breathing.  Patient had findings on chest x-ray concerning for multifocal pneumonia.  Patient has maintained good saturations, good vital signs and labs are overall reassuring.  Patient is also concerned she may have a UTI and does have an indwelling Foley cath.  Patient do not her catheter bag prior to collection of sample and we have been awaiting a urinalysis.  If patient has no evidence of infection on urinalysis I feel that patient would be a good candidate for discharge with oral antibiotics for her pneumonia.  If patient has signs of UTI on urinalysis foot patient will need to be admitted for IV antibiotics.  Patient is already received a dose of azithromycin and Rocephin via IV while we have been waiting for urinalysis results.  At shift change, patient will be handed over to attending provider, Dr. Leonides Schanz for final diagnosis and disposition.  Note:  This document was prepared using Dragon voice recognition software and may include unintentional dictation errors.   Darletta Moll, PA-C 06/12/22 0005    Ward, Delice Bison, DO 06/12/22 [phone]

## 2022-06-11 NOTE — ED Triage Notes (Addendum)
Patient to ED via [facility] form home for a cough for the past week. Productive cough with clear mucous. Does state she has some SOB. Patient states she has had diarrhea as well.

## 2022-06-12 ENCOUNTER — Encounter: Payer: Self-pay | Admitting: Family Medicine

## 2022-06-12 DIAGNOSIS — E785 Hyperlipidemia, unspecified: Secondary | ICD-10-CM | POA: Diagnosis not present

## 2022-06-12 DIAGNOSIS — J189 Pneumonia, unspecified organism: Secondary | ICD-10-CM | POA: Diagnosis present

## 2022-06-12 DIAGNOSIS — A419 Sepsis, unspecified organism: Secondary | ICD-10-CM | POA: Diagnosis present

## 2022-06-12 DIAGNOSIS — Z833 Family history of diabetes mellitus: Secondary | ICD-10-CM | POA: Diagnosis not present

## 2022-06-12 DIAGNOSIS — Z936 Other artificial openings of urinary tract status: Secondary | ICD-10-CM | POA: Diagnosis not present

## 2022-06-12 DIAGNOSIS — D649 Anemia, unspecified: Secondary | ICD-10-CM | POA: Diagnosis present

## 2022-06-12 DIAGNOSIS — Z86718 Personal history of other venous thrombosis and embolism: Secondary | ICD-10-CM | POA: Diagnosis not present

## 2022-06-12 DIAGNOSIS — Z803 Family history of malignant neoplasm of breast: Secondary | ICD-10-CM | POA: Diagnosis not present

## 2022-06-12 DIAGNOSIS — Z8542 Personal history of malignant neoplasm of other parts of uterus: Secondary | ICD-10-CM | POA: Diagnosis not present

## 2022-06-12 DIAGNOSIS — Z9841 Cataract extraction status, right eye: Secondary | ICD-10-CM | POA: Diagnosis not present

## 2022-06-12 DIAGNOSIS — Z87440 Personal history of urinary (tract) infections: Secondary | ICD-10-CM | POA: Diagnosis not present

## 2022-06-12 DIAGNOSIS — Z8249 Family history of ischemic heart disease and other diseases of the circulatory system: Secondary | ICD-10-CM | POA: Diagnosis not present

## 2022-06-12 DIAGNOSIS — K219 Gastro-esophageal reflux disease without esophagitis: Secondary | ICD-10-CM | POA: Diagnosis present

## 2022-06-12 DIAGNOSIS — R531 Weakness: Secondary | ICD-10-CM | POA: Diagnosis present

## 2022-06-12 DIAGNOSIS — E7800 Pure hypercholesterolemia, unspecified: Secondary | ICD-10-CM | POA: Diagnosis present

## 2022-06-12 DIAGNOSIS — Z8616 Personal history of COVID-19: Secondary | ICD-10-CM | POA: Diagnosis not present

## 2022-06-12 DIAGNOSIS — Z86711 Personal history of pulmonary embolism: Secondary | ICD-10-CM | POA: Diagnosis not present

## 2022-06-12 DIAGNOSIS — I5032 Chronic diastolic (congestive) heart failure: Secondary | ICD-10-CM | POA: Diagnosis present

## 2022-06-12 DIAGNOSIS — Z83438 Family history of other disorder of lipoprotein metabolism and other lipidemia: Secondary | ICD-10-CM | POA: Diagnosis not present

## 2022-06-12 DIAGNOSIS — Z79899 Other long term (current) drug therapy: Secondary | ICD-10-CM | POA: Diagnosis not present

## 2022-06-12 DIAGNOSIS — Z961 Presence of intraocular lens: Secondary | ICD-10-CM | POA: Diagnosis present

## 2022-06-12 DIAGNOSIS — E119 Type 2 diabetes mellitus without complications: Secondary | ICD-10-CM | POA: Diagnosis present

## 2022-06-12 DIAGNOSIS — I110 Hypertensive heart disease with heart failure: Secondary | ICD-10-CM | POA: Diagnosis present

## 2022-06-12 DIAGNOSIS — D696 Thrombocytopenia, unspecified: Secondary | ICD-10-CM | POA: Diagnosis present

## 2022-06-12 DIAGNOSIS — Z1152 Encounter for screening for COVID-19: Secondary | ICD-10-CM | POA: Diagnosis not present

## 2022-06-12 LAB — URINALYSIS, ROUTINE W REFLEX MICROSCOPIC
Bilirubin Urine: NEGATIVE
Bilirubin Urine: NEGATIVE
Glucose, UA: NEGATIVE mg/dL
Glucose, UA: NEGATIVE mg/dL
Hgb urine dipstick: NEGATIVE
Hgb urine dipstick: NEGATIVE
Ketones, ur: 5 mg/dL — AB
Ketones, ur: NEGATIVE mg/dL
Leukocytes,Ua: NEGATIVE
Nitrite: NEGATIVE
Nitrite: POSITIVE — AB
Protein, ur: 100 mg/dL — AB
Protein, ur: 100 mg/dL — AB
Specific Gravity, Urine: 1.016 (ref 1.005–1.030)
Specific Gravity, Urine: >1.046 — ABNORMAL HIGH (ref 1.005–1.030)
Squamous Epithelial / HPF: NONE SEEN (ref 0–5)
WBC, UA: NONE SEEN WBC/hpf (ref 0–5)
pH: 5 (ref 5.0–8.0)
pH: 6 (ref 5.0–8.0)

## 2022-06-12 LAB — BASIC METABOLIC PANEL
Anion gap: 5 (ref 5–15)
BUN: 14 mg/dL (ref 8–23)
CO2: 22 mmol/L (ref 22–32)
Calcium: 8.3 mg/dL — ABNORMAL LOW (ref 8.9–10.3)
Chloride: 110 mmol/L (ref 98–111)
Creatinine, Ser: 0.68 mg/dL (ref 0.44–1.00)
GFR, Estimated: >60 mL/min (ref >60)
Glucose, Bld: 100 mg/dL — ABNORMAL HIGH (ref 70–99)
Potassium: 3.8 mmol/L (ref 3.5–5.1)
Sodium: 137 mmol/L (ref 135–145)

## 2022-06-12 LAB — CBC
HCT: 21.3 % — ABNORMAL LOW (ref 36.0–46.0)
Hemoglobin: 6.5 g/dL — ABNORMAL LOW (ref 12.0–15.0)
MCH: 28.1 pg (ref 26.0–34.0)
MCHC: 30.5 g/dL (ref 30.0–36.0)
MCV: 92.2 fL (ref 80.0–100.0)
Platelets: 353 10*3/uL (ref 150–400)
RBC: 2.31 MIL/uL — ABNORMAL LOW (ref 3.87–5.11)
RDW: 17.3 % — ABNORMAL HIGH (ref 11.5–15.5)
WBC: 5.6 10*3/uL (ref 4.0–10.5)
nRBC: 0 % (ref 0.0–0.2)

## 2022-06-12 LAB — PROTIME-INR
INR: 1.2 (ref 0.8–1.2)
Prothrombin Time: 15.2 seconds (ref 11.4–15.2)

## 2022-06-12 LAB — PROCALCITONIN: Procalcitonin: 0.93 ng/mL

## 2022-06-12 MED ORDER — ACETAMINOPHEN 325 MG PO TABS: 650.0000 mg | ORAL_TABLET | Freq: Four times a day (QID) | ORAL | Status: DC | PRN

## 2022-06-12 MED ORDER — SODIUM CHLORIDE 0.9 % IV SOLN
500.0000 mg | INTRAVENOUS | Status: DC
  Administered 2022-06-12 – 2022-06-13 (×2): 500 mg via INTRAVENOUS
  Filled 2022-06-12 (×3): qty 5

## 2022-06-12 MED ORDER — FUROSEMIDE 20 MG PO TABS
20.0000 mg | ORAL_TABLET | Freq: Every day | ORAL | Status: DC
  Administered 2022-06-12 – 2022-06-14 (×3): 20 mg via ORAL
  Filled 2022-06-12 (×3): qty 1

## 2022-06-12 MED ORDER — SODIUM CHLORIDE 0.9 % IV SOLN
2.0000 g | INTRAVENOUS | Status: DC
  Administered 2022-06-12 – 2022-06-13 (×2): 2 g via INTRAVENOUS
  Filled 2022-06-12 (×3): qty 20

## 2022-06-12 MED ORDER — ONDANSETRON HCL 4 MG/2ML IJ SOLN: 4.0000 mg | Freq: Four times a day (QID) | INTRAMUSCULAR | Status: DC | PRN

## 2022-06-12 MED ORDER — PANTOPRAZOLE SODIUM 40 MG PO TBEC
40.0000 mg | DELAYED_RELEASE_TABLET | Freq: Two times a day (BID) | ORAL | Status: DC
  Administered 2022-06-12 – 2022-06-14 (×5): 40 mg via ORAL
  Filled 2022-06-12 (×5): qty 1

## 2022-06-12 MED ORDER — ACETAMINOPHEN 650 MG RE SUPP: 650.0000 mg | Freq: Four times a day (QID) | RECTAL | Status: DC | PRN

## 2022-06-12 MED ORDER — ADULT MULTIVITAMIN W/MINERALS CH
1.0000 | ORAL_TABLET | Freq: Every day | ORAL | Status: DC
  Administered 2022-06-12 – 2022-06-14 (×3): 1 via ORAL
  Filled 2022-06-12 (×3): qty 1

## 2022-06-12 MED ORDER — DIFLUPREDNATE 0.05 % OP EMUL: 1.0000 [drp] | Freq: Two times a day (BID) | OPHTHALMIC | Status: DC

## 2022-06-12 MED ORDER — POLYETHYLENE GLYCOL 3350 17 G PO PACK: 17.0000 g | PACK | Freq: Every day | ORAL | Status: DC | PRN

## 2022-06-12 MED ORDER — SODIUM CHLORIDE 0.9 % IV SOLN
1.0000 g | Freq: Once | INTRAVENOUS | Status: AC
  Administered 2022-06-12: 1 g via INTRAVENOUS
  Filled 2022-06-12: qty 10

## 2022-06-12 MED ORDER — TRAZODONE HCL 50 MG PO TABS: 25.0000 mg | ORAL_TABLET | Freq: Every evening | ORAL | Status: DC | PRN

## 2022-06-12 MED ORDER — ROSUVASTATIN CALCIUM 10 MG PO TABS
10.0000 mg | ORAL_TABLET | Freq: Every morning | ORAL | Status: DC
  Administered 2022-06-12 – 2022-06-14 (×3): 10 mg via ORAL
  Filled 2022-06-12 (×4): qty 1

## 2022-06-12 MED ORDER — MOXIFLOXACIN HCL 0.5 % OP SOLN
1.0000 [drp] | Freq: Three times a day (TID) | OPHTHALMIC | Status: DC
  Filled 2022-06-12: qty 0.1
  Filled 2022-06-12: qty 3
  Filled 2022-06-12: qty 0.1

## 2022-06-12 MED ORDER — SODIUM CHLORIDE 0.9% IV SOLUTION
Freq: Once | INTRAVENOUS | Status: AC
  Filled 2022-06-12: qty 250

## 2022-06-12 MED ORDER — MIDODRINE HCL 5 MG PO TABS
10.0000 mg | ORAL_TABLET | Freq: Three times a day (TID) | ORAL | Status: DC
  Administered 2022-06-12 – 2022-06-13 (×5): 10 mg via ORAL
  Filled 2022-06-12 (×5): qty 2

## 2022-06-12 MED ORDER — MAGNESIUM HYDROXIDE 400 MG/5ML PO SUSP: 30.0000 mL | Freq: Every day | ORAL | Status: DC | PRN

## 2022-06-12 MED ORDER — SODIUM CHLORIDE 0.9 % IV SOLN: INTRAVENOUS | Status: DC

## 2022-06-12 MED ORDER — KETOROLAC TROMETHAMINE 0.5 % OP SOLN
1.0000 [drp] | Freq: Four times a day (QID) | OPHTHALMIC | Status: DC
  Filled 2022-06-12 (×3): qty 3

## 2022-06-12 MED ORDER — ONDANSETRON HCL 4 MG PO TABS: 4.0000 mg | ORAL_TABLET | Freq: Four times a day (QID) | ORAL | Status: DC | PRN

## 2022-06-12 MED ORDER — ENOXAPARIN SODIUM 40 MG/0.4ML IJ SOSY
0.5000 mg/kg | PREFILLED_SYRINGE | INTRAMUSCULAR | Status: DC
  Administered 2022-06-12: 100 mg via SUBCUTANEOUS
  Administered 2022-06-13 – 2022-06-14 (×2): 37.5 mg via SUBCUTANEOUS
  Filled 2022-06-12 (×3): qty 0.4

## 2022-06-12 MED ORDER — FERROUS SULFATE 325 (65 FE) MG PO TABS
325.0000 mg | ORAL_TABLET | Freq: Two times a day (BID) | ORAL | Status: DC
  Administered 2022-06-12 – 2022-06-14 (×5): 325 mg via ORAL
  Filled 2022-06-12 (×5): qty 1

## 2022-06-12 MED ORDER — VITAMIN C 500 MG PO TABS
1000.0000 mg | ORAL_TABLET | Freq: Every day | ORAL | Status: DC
  Administered 2022-06-12 – 2022-06-14 (×3): 1000 mg via ORAL
  Filled 2022-06-12 (×3): qty 2

## 2022-06-12 NOTE — Assessment & Plan Note (Signed)
-   Urinalysis was obtained and is pending. - She is being covered for the possibility of UTI with IV Rocephin.

## 2022-06-12 NOTE — ED Notes (Signed)
Assisted RN Tom in room 51 in collecting urine via straight cath. Urine specimen sent

## 2022-06-12 NOTE — Assessment & Plan Note (Signed)
-   We will continue PPI therapy 

## 2022-06-12 NOTE — ED Notes (Signed)
Request made for transport to the floor ?

## 2022-06-12 NOTE — TOC Initial Note (Signed)
Transition of Care [facility]) - Initial/Assessment Note    Patient Details  Name: Courtney Mack MRN: [id number] Date of Birth: 11-11-1955  [facility]) CM/SW Contact:    Shelbie Hutching, RN Phone Number: 06/12/2022, 11:46 AM  Clinical Narrative:                 Patient admitted to the hospital with sepsis due to pneumonia.  RNCM met with patient at the bedside, patient is currently on acute O2 at 2L [place].  Patient is from home with her daughter, she walks with a rollator.  Patient has a wheelchair and 3 in 1 at home as well.  Patient reports that since she has had cataract surgery she can drive but her daughter provides all the transportation.  Patient is current with [facility] for PCP.  She uses [facility] on [address] for prescriptions.   Patient agrees to home health services in the past.  Patient does not have a preference in agency she agrees to referral to [facility].  [place] accepted referral for RN and PT.   TOC will cont to follow.   Expected Discharge Plan: [place] Barriers to Discharge: Continued Medical Work up   Patient Goals and CMS Choice Patient states their goals for this hospitalization and ongoing recovery are:: Patietn wants to be well CMS Medicare.gov Compare Post Acute Care list provided to:: Patient Choice offered to / list presented to : Patient  Expected Discharge Plan and Services Expected Discharge Plan: [place]   Discharge Planning Services: CM Consult Post Acute Care Choice: [place] arrangements for the past 2 months: Single Family Home                 DME Arranged: N/A DME Agency: NA       HH Arranged: RN, PT HH Agency: [place] Date [place]: 06/12/22 Time [place]: 7209 Representative spoke with at [place]: [place]  Prior Living Arrangements/Services Living arrangements for the past 2 months: [place] with::  Adult Children Patient language and need for interpreter reviewed:: Yes Do you feel safe going back to the place where you live?: Yes      Need for Family Participation in Patient Care: Yes (Comment) Care giver support system in place?: Yes (comment) Current home services: DME (rollator, bedside commode, Wheelchair) Criminal Activity/Legal Involvement Pertinent to Current Situation/Hospitalization: No - Comment as needed  Activities of Daily Living Home Assistive Devices/Equipment: Environmental consultant (specify type), Eyeglasses, Grab bars in shower ADL Screening (condition at time of admission) Patient's cognitive ability adequate to safely complete daily activities?: Yes Is the patient deaf or have difficulty hearing?: No Does the patient have difficulty seeing, even when wearing glasses/contacts?: No Does the patient have difficulty concentrating, remembering, or making decisions?: No Patient able to express need for assistance with ADLs?: Yes Does the patient have difficulty dressing or bathing?: No Independently performs ADLs?: Yes (appropriate for developmental age) Does the patient have difficulty walking or climbing stairs?: Yes Weakness of Legs: Both Weakness of Arms/Hands: None  Permission Sought/Granted Permission sought to share information with : Case Manager, Customer service manager, Family Supports Permission granted to share information with : Yes, Verbal Permission Granted  Share Information with NAME: Johnny Bridge  Permission granted to share info w AGENCY: [facility]  Permission granted to share info w Relationship: daughter  Permission granted to share info w Contact Information:  [phone]  Emotional Assessment Appearance:: Appears stated age Attitude/Demeanor/Rapport: Engaged Affect (typically observed): Accepting Orientation: : Oriented to Self, Oriented to Place, Oriented to  Time, Oriented to Situation Alcohol / Substance Use: Not Applicable Psych  Involvement: No (comment)  Admission diagnosis:  Sepsis due to pneumonia ([place]) [J18.9, A41.9] Patient Active Problem List   Diagnosis Date Noted   Sepsis due to pneumonia ([place]) 06/12/2022   Multifocal pneumonia 06/12/2022   GERD without esophagitis 06/12/2022   Generalized weakness 06/12/2022   Nephrostomy status ([place]) 06/12/2022   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) 04/16/2022   GI bleed 03/14/2022   Symptomatic anemia 02/24/2022   Hypotension 02/24/2022   Acute cystitis with hematuria    Lymphedema 02/08/2022   Hematuria 12/24/2021   History of pulmonary embolus (PE) 12/24/2021   Chronic radiation cystitis 11/17/2021   Bilateral hydronephrosis s/p left nephrostomy tube June 2023    Gastric antral vascular ectasia    Melena 07/18/2021   Acute on chronic blood loss anemia 07/18/2021   HLD (hyperlipidemia) 06/20/2021   Chronic diastolic CHF (congestive heart failure) ([place]) 06/20/2021   Risk factors for obstructive sleep apnea 11/10/2020   IDA (iron deficiency anemia) 09/21/2020   Endometrial cancer ([place]) 09/13/2020   Recurrent deep vein thrombosis (DVT) ([place]) 09/06/2020   Morbidly obese ([place]) 08/23/2020   Dyslipidemia 08/23/2020   PCP:  Center, [place]:   [id number] [address] [place] [address] Phone: [phone] Fax: [place] [address] Phone: [phone] Fax: [phone]     Social Determinants of Health (SDOH) Interventions    Readmission Risk Interventions    06/12/2022   11:45 AM 04/18/2022    2:50 PM 03/14/2022    3:51 PM  Readmission Risk Prevention Plan  Transportation Screening Complete Complete Complete  Medication Review (RN Care Manager) Complete Complete Complete  PCP or Specialist appointment within  3-5 days of discharge Complete Complete   HRI or Home Care Consult Complete  Complete  SW Recovery Care/Counseling Consult Complete Complete Complete  Palliative Care Screening Not Applicable Not Applicable Not [place] Not Applicable Not Applicable Not Applicable

## 2022-06-12 NOTE — H&P (Signed)
[place]   PATIENT NAME: Courtney Mack    MR#:  [id number]  DATE OF BIRTH:  04-Sep-1955  DATE OF ADMISSION:  06/11/2022  PRIMARY CARE PHYSICIAN: [facility] [place]   Patient is coming from: Home  REQUESTING/REFERRING PHYSICIAN: Ward, Delice Bison, DO  CHIEF COMPLAINT:   Chief Complaint  Patient presents with   Cough    HISTORY OF PRESENT ILLNESS:  Courtney Mack is a 66 y.o. Caucasian female with medical history significant for CHF, type diabetes mellitus, GERD, hypertension, dyslipidemia, DVT and PE, presented to the ER with acute onset of worsening cough with inability to expectorate and associated dyspnea with generalized weakness.  No chest pain or palpitations.  The patient was concerned about pneumonia as well as  UTI as she has a nephrostomy tube in place.  She has a recurrent UTI.   She noted the urine to be more cloudy and was concerned about possible pus in it.  She denies any flank pain or hematuria.  She admitted to fever and chills.  No nausea or vomiting or abdominal pain.  ED Course: When the patient came to the ER, heart rate was 117 with otherwise normal vital signs.  Labs revealed unremarkable BMP.  CBC showed WBC of 10.9 and anemia lower than previous levels with thrombocytosis.  Influenza antigens and COVID-19 PCR came back negative.  Urinalysis showed specific gravity more than 1046 and 100 protein, 5 ketones and was otherwise unremarkable.  Nephrostomy urine sample is currently pending. EKG as reviewed by me : EKG showed sinus tachycardia with a rate of 113. Imaging: Two-view chest x-ray showed new patchy infiltrates in the posterior aspects of both lower lung fields suggesting multifocal pneumonia.  The patient was given IV Rocephin and Zithromax as well as 500 mill IV normal saline bolus.  She will be admitted to a medical telemetry bed for further evaluation and management. PAST MEDICAL HISTORY:   Past Medical History:   Diagnosis Date   Acute bilateral deep vein thrombosis (DVT) of femoral veins ([place]) 01/02/2021   Acute massive pulmonary embolism ([place]) 08/25/2020   Last Assessment & Plan:  Formatting of this note might be different from the original. Post vena caval filter and on xarelto and O2   Anemia    ARF (acute respiratory failure) ([place])    Cellulitis of left lower leg 06/20/2021   CHF (congestive heart failure) ([place])    COVID-19    Diabetes mellitus without complication ([place])    DVT (deep venous thrombosis) ([place]) 09/06/2020   Endometrial cancer (HCC)    GAVE (gastric antral vascular ectasia)    GERD (gastroesophageal reflux disease)    High cholesterol    Hx of blood clots    Hyperlipidemia    Hypertension    IDA (iron deficiency anemia) 09/21/2020   Obesity    Pressure injury of skin 06/21/2021   Pulmonary embolism (HCC)    Sepsis (HCC)    Thrombocytopenia ([place])    Urinary tract infection 09/13/2021    PAST SURGICAL HISTORY:   Past Surgical History:  Procedure Laterality Date   CATARACT EXTRACTION W/PHACO Right 06/04/2022   Procedure: CATARACT EXTRACTION PHACO AND INTRAOCULAR LENS PLACEMENT (IOC) COMPLICATED RIGHT  09.47  02:17.4;  Surgeon: Birder Robson, MD;  Location: [facility];  Service: Ophthalmology;  Laterality: Right;   COLONOSCOPY N/A 06/21/2021   Procedure: COLONOSCOPY;  Surgeon: Toledo, Benay Pike, MD;  Location: [facility] ENDOSCOPY;  Service: Gastroenterology;  Laterality: N/A;  CYSTOSCOPY W/ URETERAL STENT PLACEMENT Left 09/11/2021   Procedure: CYSTOSCOPY WITH RETROGRADE PYELOGRAM/URETERAL STENT PLACEMENT;  Surgeon: Abbie Sons, MD;  Location: [facility]S;  Service: Urology;  Laterality: Left;   CYSTOSCOPY W/ URETERAL STENT PLACEMENT Left 11/17/2021   Procedure: CYSTOSCOPY WITH STENT REPLACEMENT;  Surgeon: Irine Seal, MD;  Location: [facility] ORS;  Service: Urology;  Laterality: Left;   CYSTOSCOPY WITH FULGERATION  11/17/2021   Procedure: CYSTOSCOPY WITH FULGERATION;   Surgeon: Irine Seal, MD;  Location: [facility] ORS;  Service: Urology;;   CYSTOSCOPY WITH STENT PLACEMENT Left 10/17/2021   Procedure: CYSTOSCOPY WITH STENT PLACEMENT;  Surgeon: Abbie Sons, MD;  Location: [facility] ORS;  Service: Urology;  Laterality: Left;   CYSTOSCOPY WITH URETEROSCOPY, STONE BASKETRY AND STENT PLACEMENT Left 10/16/2021   Procedure: CYSTOSCOPY WITH URETEROSCOPY  AND STENT REMOVAL;  Surgeon: Abbie Sons, MD;  Location: [facility]S;  Service: Urology;  Laterality: Left;   ENTEROSCOPY N/A 08/17/2021   Procedure: ENTEROSCOPY;  Surgeon: Lin Landsman, MD;  Location: [facility] ENDOSCOPY;  Service: Gastroenterology;  Laterality: N/A;   ESOPHAGOGASTRODUODENOSCOPY N/A 06/21/2021   Procedure: ESOPHAGOGASTRODUODENOSCOPY (EGD);  Surgeon: Toledo, Benay Pike, MD;  Location: [facility] ENDOSCOPY;  Service: Gastroenterology;  Laterality: N/A;   ESOPHAGOGASTRODUODENOSCOPY (EGD) WITH PROPOFOL N/A 08/17/2021   Procedure: ESOPHAGOGASTRODUODENOSCOPY (EGD) WITH PROPOFOL;  Surgeon: Lin Landsman, MD;  Location[facility] ENDOSCOPY;  Service: Gastroenterology;  Laterality: N/A;   ESOPHAGOGASTRODUODENOSCOPY (EGD) WITH PROPOFOL N/A 10/14/2021   Procedure: ESOPHAGOGASTRODUODENOSCOPY (EGD) WITH PROPOFOL;  Surgeon: [name], MD;  Location: [facility] ENDOSCOPY;  Service: Endoscopy;  Laterality: N/A;   ESOPHAGOGASTRODUODENOSCOPY (EGD) WITH PROPOFOL N/A 02/01/2022   Procedure: ESOPHAGOGASTRODUODENOSCOPY (EGD) WITH PROPOFOL;  Surgeon: Lin Landsman, MD;  Location: [facility] ENDOSCOPY;  Service: Gastroenterology;  Laterality: N/A;   ESOPHAGOGASTRODUODENOSCOPY (EGD) WITH PROPOFOL N/A 03/15/2022   Procedure: ESOPHAGOGASTRODUODENOSCOPY (EGD) WITH PROPOFOL;  Surgeon: Jonathon Bellows, MD;  Location: [facility] ENDOSCOPY;  Service: Gastroenterology;  Laterality: N/A;   ESOPHAGOGASTRODUODENOSCOPY (EGD) WITH PROPOFOL N/A 04/18/2022   Procedure: ESOPHAGOGASTRODUODENOSCOPY (EGD) WITH PROPOFOL;  Surgeon: Lin Landsman, MD;  Location: [facility]  ENDOSCOPY;  Service: Gastroenterology;  Laterality: N/A;   GIVENS CAPSULE STUDY N/A 06/22/2021   Procedure: GIVENS CAPSULE STUDY;  Surgeon: Toledo, Benay Pike, MD;  Location: [facility] ENDOSCOPY;  Service: Gastroenterology;  Laterality: N/A;   IR NEPHROSTOMY EXCHANGE LEFT  02/25/2022   IR NEPHROSTOMY EXCHANGE LEFT  03/27/2022   IR NEPHROSTOMY EXCHANGE LEFT  05/22/2022   IR NEPHROSTOMY PLACEMENT LEFT  12/27/2021   IVC FILTER INSERTION N/A 08/18/2020   Procedure: IVC FILTER INSERTION;  Surgeon: Katha Cabal, MD;  Location: [facility] CV LAB;  Service: Cardiovascular;  Laterality: N/A;   PERIPHERAL VASCULAR THROMBECTOMY Bilateral 01/03/2021   Procedure: PERIPHERAL VASCULAR THROMBECTOMY;  Surgeon: Algernon Huxley, MD;  Location: [facility] CV LAB;  Service: Cardiovascular;  Laterality: Bilateral;   PULMONARY THROMBECTOMY N/A 08/18/2020   Procedure: PULMONARY THROMBECTOMY;  Surgeon: Katha Cabal, MD;  Location: [place] CV LAB;  Service: Cardiovascular;  Laterality: N/A;   TUBAL LIGATION     VISCERAL ANGIOGRAPHY N/A 07/18/2021   Procedure: VISCERAL ANGIOGRAPHY;  Surgeon: Algernon Huxley, MD;  Location: [place] CV LAB;  Service: Cardiovascular;  Laterality: N/A;    SOCIAL HISTORY:   Social History   Tobacco Use   Smoking status: Never   Smokeless tobacco: Never   Tobacco comments:    smoked 2-4 cigarettes for about 2-3 weeks   Substance Use Topics   Alcohol use: Not Currently  FAMILY HISTORY:   Family History  Problem Relation Age of Onset   Hypertension Mother    Diabetes Mother    Diabetes Father    Hypertension Father    High Cholesterol Father    Congestive Heart Failure Father    Breast cancer Cousin     DRUG ALLERGIES:  No Known Allergies  REVIEW OF SYSTEMS:   ROS As per history of present illness. All pertinent systems were reviewed above. Constitutional, HEENT, cardiovascular, respiratory, GI, GU, musculoskeletal, neuro, psychiatric, endocrine,  integumentary and hematologic systems were reviewed and are otherwise negative/unremarkable except for positive findings mentioned above in the HPI.   MEDICATIONS AT HOME:   Prior to Admission medications   Medication Sig Start Date End Date Taking? Authorizing Provider  Ascorbic Acid (VITAMIN C) 1000 MG tablet Take 1,000 mg by mouth daily.   Yes [provider]  Difluprednate 0.05 % EMUL Place 1 drop into the right eye 2 (two) times daily. 05/31/22  Yes [provider]  ferrous sulfate 325 (65 FE) MG tablet Take 1 tablet (325 mg total) by mouth 2 (two) times daily with a meal. 03/16/22  Yes Lorella Nimrod, MD  fluticasone furoate-vilanterol (BREO ELLIPTA) 100-25 MCG/ACT AEPB Inhale 1 puff into the lungs daily.   Yes [provider]  furosemide (LASIX) 20 MG tablet Take 1 tablet (20 mg total) by mouth daily. Home med. 04/20/22  Yes Enzo Bi, MD  ketorolac (ACULAR) 0.5 % ophthalmic solution Place 1 drop into the right eye 4 (four) times daily. 05/29/22  Yes [provider]  midodrine (PROAMATINE) 10 MG tablet Take 1 tablet (10 mg total) by mouth 3 (three) times daily with meals. 04/20/22 07/19/22 Yes Enzo Bi, MD  moxifloxacin (VIGAMOX) 0.5 % ophthalmic solution Place 1 drop into the right eye 3 (three) times daily. 05/29/22  Yes [provider]  Multiple Vitamin (MULTIVITAMIN WITH MINERALS) TABS tablet Take 1 tablet by mouth daily. 09/12/20  Yes [name], MD  pantoprazole (PROTONIX) 40 MG tablet Take 1 tablet (40 mg total) by mouth 2 (two) times daily. 03/01/22 06/12/22 Yes Sreenath, Sudheer B, MD  rosuvastatin (CRESTOR) 10 MG tablet Take 1 tablet (10 mg total) by mouth daily. Patient taking differently: Take 10 mg by mouth in the morning. 10/03/20  Yes Earlie Server, MD  acetaminophen (TYLENOL) 500 MG tablet Take 500 mg by mouth every 6 (six) hours as needed for mild pain or fever.    [provider]  polyethylene glycol (MIRALAX / GLYCOLAX) 17  g packet Take 17 g by mouth daily as needed for mild constipation. 03/29/22   Lavina Hamman, MD      VITAL SIGNS:  Blood pressure (!) 146/85, pulse (!) 54, temperature 98.9 F (37.2 C), temperature source Oral, resp. rate 18, SpO2 98 %.  PHYSICAL EXAMINATION:  Physical Exam  GENERAL:  66 y.o.-year-old Caucasian female patient lying in the bed with no acute distress.  EYES: Pupils equal, round, reactive to light and accommodation. No scleral icterus. Extraocular muscles intact.  HEENT: Head atraumatic, normocephalic. Oropharynx and nasopharynx clear.  NECK:  Supple, no jugular venous distention. No thyroid enlargement, no tenderness.  LUNGS: Diminished bibasilar breath sounds with bibasal crackles and rhonchi.  No use of accessory muscles of respiration.  CARDIOVASCULAR: Regular rate and rhythm, S1, S2 normal. No murmurs, rubs, or gallops.  ABDOMEN: Soft, nondistended, nontender. Bowel sounds present. No organomegaly or mass.  EXTREMITIES: Nonpitting edema/lymphedema with no cyanosis, or clubbing.  NEUROLOGIC: Cranial  nerves II through XII are intact. Muscle strength 5/5 in all extremities. Sensation intact. Gait not checked.  PSYCHIATRIC: The patient is alert and oriented x 3.  Normal affect and good eye contact. SKIN: No obvious rash, lesion, or ulcer.   LABORATORY PANEL:   CBC Recent Labs  Lab 06/11/22 1544  WBC 10.8*  HGB 8.4*  HCT 27.3*  PLT 461*   ------------------------------------------------------------------------------------------------------------------  Chemistries  Recent Labs  Lab 06/11/22 1544  NA 137  K 4.0  CL 108  CO2 23  GLUCOSE 109*  BUN 15  CREATININE 0.74  CALCIUM 8.9   ------------------------------------------------------------------------------------------------------------------  Cardiac Enzymes No results for input(s): "TROPONINI" in the last 168  hours. ------------------------------------------------------------------------------------------------------------------  RADIOLOGY:  DG Chest 2 View  Result Date: 06/11/2022 CLINICAL DATA:  Cough, shortness of breath EXAM: CHEST - 2 VIEW COMPARISON:  03/12/2022 FINDINGS: Cardiac size is within normal limits. There are no signs of pulmonary edema. New patchy infiltrates are seen in the medial aspects of both lower lung fields. There is no pleural effusion or pneumothorax. IMPRESSION: New patchy infiltrates are seen in the posterior aspects of both lower lung fields suggesting multifocal pneumonia. Electronically Signed   By: Elmer Picker M.D.   On: 06/11/2022 16:31      IMPRESSION AND PLAN:  Assessment and Plan: * Sepsis due to pneumonia [facility]) - The patient will be admitted to a medical telemetry bed. - This is associated with generalized weakness. - Sepsis manifested by tachycardia and tachypnea - We will continue antibiotic therapy with IV Rocephin and Zithromax. - We will follow blood cultures. - She will be hydrated with IV normal saline.  Multifocal pneumonia - This is mainly in both lower lobes. - Management as above.  Dyslipidemia - We will continue statin therapy.  GERD without esophagitis - We will continue PPI therapy.  Nephrostomy status ([place]) - Urinalysis was obtained and is pending. - She is being covered for the possibility of UTI with IV Rocephin.   DVT prophylaxis: Lovenox.  Advanced Care Planning:  Code Status: full code.  Family Communication:  The plan of care was discussed in details with the patient (and family). I answered all questions. The patient agreed to proceed with the above mentioned plan. Further management will depend upon hospital course. Disposition Plan: Back to previous home environment Consults called: none.  All the records are reviewed and case discussed with ED provider.  Status is: Inpatient  At the time of the admission,  it appears that the appropriate admission status for this patient is inpatient.  This is judged to be reasonable and necessary in order to provide the required intensity of service to ensure the patient's safety given the presenting symptoms, physical exam findings and initial radiographic and laboratory data in the context of comorbid conditions.  The patient requires inpatient status due to high intensity of service, high risk of further deterioration and high frequency of surveillance required.  I certify that at the time of admission, it is my clinical judgment that the patient will require inpatient hospital care extending more than 2 midnights.                            Dispo: The patient is from: Home              Anticipated d/c is to: Home              Patient currently is not medically stable to  d/c.              Difficult to place patient: No  Christel Mormon M.D on 06/12/2022 at 5:33 AM  [facility]   From 7 PM-7 AM, contact night-coverage [email or web address]  CC: Primary care physician; [facility] [place]

## 2022-06-12 NOTE — Assessment & Plan Note (Addendum)
-   The patient will be admitted to a medical telemetry bed. - This is associated with generalized weakness. - Sepsis manifested by tachycardia and tachypnea - We will continue antibiotic therapy with IV Rocephin and Zithromax. - We will follow blood cultures. - She will be hydrated with IV normal saline.

## 2022-06-12 NOTE — ED Notes (Signed)
Pt brought back from flex to hallway bed 18. Continues to complain of cough and left sided back pain. Pt presents with left sided nephrostomy tube. Urine emptied for pending sample collection. Second gram of IV rocephin started to 22 G R forearm. Pt states she lives with her daughter at home and take care of herself. Pt typically ambulated with walker. Has bilateral edema +2, she states this is normal. Denies SOB at this time. Pt informed on will ambulate when IV ABX completed.

## 2022-06-12 NOTE — Assessment & Plan Note (Signed)
-   This is mainly in both lower lobes. - Management as above.

## 2022-06-12 NOTE — Assessment & Plan Note (Signed)
-   We will continue statin therapy. 

## 2022-06-12 NOTE — Progress Notes (Signed)
  PROGRESS NOTE    Courtney Mack  DHR:[id number] DOB: 06-Aug-1955 DOA: 06/11/2022 PCP: [facility] [place]  [id number]  LOS: 0 days   Brief hospital course:   Assessment & Plan: Courtney Mack is a 66 y.o. Caucasian female with medical history significant for CHF, type diabetes mellitus, GERD, hypertension, dyslipidemia, DVT and PE, presented to the ER with acute onset of worsening cough with inability to expectorate and associated dyspnea with generalized weakness.   * Sepsis due to pneumonia [facility]) - presented with tachycardia and tachypnea and leukocytosis   Multifocal pneumonia - This is mainly in both lower lobes. --procal elevated at 0.93 --started on ceftriaxone and azithromycin --cont ceftriaxone and azithromycin  Chronic anemia Hx of iron def --has had recurrent 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 vascular ectasia requiring frequent blood transfusion in the past.  Currently no signs of bleeding. Plan: --1u pRBC for Hgb 6.5 today --cont iron supplement  Hx of hypotension --on home midodrine 10 mg TID --cont midodrine with taper if tolerated  Left Nephrostomy status (HCC)   Dyslipidemia - continue statin therapy.   GERD without esophagitis - continue PPI therapy.   Recent cataract surgery --on 06/04/22 --cont eye drops    DVT prophylaxis: Lovenox SQ Code Status: Full code  Family Communication:  Level of care: Telemetry Medical Dispo:   The patient is from: home Anticipated d/c is to: home Anticipated d/c date is: 1-2 days   Subjective and Interval History:  Pt reported having coughing spells.  No dysuria.  No blood in her urine.   Objective: Vitals:   06/12/22 1330 06/12/22 1630 06/12/22 1700 06/12/22 1730  BP: (!) 149/66 (!) 117/57 (!) 106/53 (!) 102/52  Pulse: 96 99 (!) 105 (!) 101  Resp: 20   20  Temp:      TempSrc:      SpO2: 95% 95% 93% 92%    Intake/Output Summary (Last 24 hours) at 06/12/2022 1910 Last  data filed at 06/11/2022 2201 Gross per 24 hour  Intake 850 ml  Output --  Net 850 ml   There were no vitals filed for this visit.  Examination:   Constitutional: NAD, AAOx3 HEENT: conjunctivae and lids normal, EOMI CV: No cyanosis.   RESP: normal respiratory effort, on 2L Neuro: II - XII grossly intact.   Psych: Normal mood and affect.  Appropriate judgement and reason   Data Reviewed: I have personally reviewed labs and imaging studies   Enzo Bi, MD [facility] If 7PM-7AM, please contact night-coverage 06/12/2022, 7:10 PM

## 2022-06-12 NOTE — Progress Notes (Signed)
Anticoagulation monitoring(Lovenox):  66 yo female ordered Lovenox 40 mg Q24h    There were no vitals filed for this visit. BMI 32.9   Lab Results  Component Value Date   CREATININE 0.74 06/11/2022   CREATININE 0.99 05/03/2022   CREATININE 0.78 04/20/2022   Estimated Creatinine Clearance: 59.8 mL/min (by C-G formula based on SCr of 0.74 mg/dL). Hemoglobin & Hematocrit     Component Value Date/Time   HGB 8.4 (L) 06/11/2022 1544   HCT 27.3 (L) 06/11/2022 1544     Per Protocol for Patient with estCrcl > 30 ml/min and BMI > 32.9, will transition to Lovenox 37.5 mg Q24h.

## 2022-06-13 DIAGNOSIS — A419 Sepsis, unspecified organism: Secondary | ICD-10-CM | POA: Diagnosis not present

## 2022-06-13 DIAGNOSIS — J189 Pneumonia, unspecified organism: Secondary | ICD-10-CM | POA: Diagnosis not present

## 2022-06-13 LAB — CBC
HCT: 25 % — ABNORMAL LOW (ref 36.0–46.0)
Hemoglobin: 7.7 g/dL — ABNORMAL LOW (ref 12.0–15.0)
MCH: 27.9 pg (ref 26.0–34.0)
MCHC: 30.8 g/dL (ref 30.0–36.0)
MCV: 90.6 fL (ref 80.0–100.0)
Platelets: 388 10*3/uL (ref 150–400)
RBC: 2.76 MIL/uL — ABNORMAL LOW (ref 3.87–5.11)
RDW: 16.3 % — ABNORMAL HIGH (ref 11.5–15.5)
WBC: 4.8 10*3/uL (ref 4.0–10.5)
nRBC: 0 % (ref 0.0–0.2)

## 2022-06-13 LAB — BASIC METABOLIC PANEL
Anion gap: 6 (ref 5–15)
BUN: 13 mg/dL (ref 8–23)
CO2: 23 mmol/L (ref 22–32)
Calcium: 8.3 mg/dL — ABNORMAL LOW (ref 8.9–10.3)
Chloride: 108 mmol/L (ref 98–111)
Creatinine, Ser: 0.74 mg/dL (ref 0.44–1.00)
GFR, Estimated: >60 mL/min (ref >60)
Glucose, Bld: 109 mg/dL — ABNORMAL HIGH (ref 70–99)
Potassium: 3.8 mmol/L (ref 3.5–5.1)
Sodium: 137 mmol/L (ref 135–145)

## 2022-06-13 LAB — MAGNESIUM: Magnesium: 1.9 mg/dL (ref 1.7–2.4)

## 2022-06-13 LAB — URINE CULTURE

## 2022-06-13 LAB — PREPARE RBC (CROSSMATCH)

## 2022-06-13 MED ORDER — FLUTICASONE FUROATE-VILANTEROL 100-25 MCG/ACT IN AEPB
1.0000 | INHALATION_SPRAY | Freq: Every day | RESPIRATORY_TRACT | Status: DC
  Administered 2022-06-13 – 2022-06-14 (×2): 1 via RESPIRATORY_TRACT
  Filled 2022-06-13: qty 28

## 2022-06-13 MED ORDER — MIDODRINE HCL 5 MG PO TABS
5.0000 mg | ORAL_TABLET | Freq: Three times a day (TID) | ORAL | Status: DC
  Administered 2022-06-14: 5 mg via ORAL
  Filled 2022-06-13: qty 1

## 2022-06-13 MED ORDER — GUAIFENESIN-DM 100-10 MG/5ML PO SYRP
10.0000 mL | ORAL_SOLUTION | Freq: Four times a day (QID) | ORAL | Status: DC | PRN
  Administered 2022-06-13: 10 mL via ORAL
  Filled 2022-06-13: qty 10

## 2022-06-13 MED ORDER — MENTHOL 3 MG MT LOZG
1.0000 | LOZENGE | OROMUCOSAL | Status: DC | PRN
  Administered 2022-06-13 (×2): 3 mg via ORAL
  Filled 2022-06-13: qty 9

## 2022-06-13 NOTE — Progress Notes (Signed)
  PROGRESS NOTE    Courtney Mack  KLK:[id number] DOB: [birth year] DOA: 06/11/2022 PCP: [facility] [place]  [id number]/[id number]-AA  LOS: 1 day   Brief hospital course:   Assessment & Plan: Courtney Mack is a 66 y.o. Caucasian female with medical history significant for CHF, type diabetes mellitus, GERD, hypertension, dyslipidemia, DVT and PE, presented to the ER with acute onset of worsening cough with inability to expectorate and associated dyspnea with generalized weakness.   * Sepsis due to pneumonia [facility]) - presented with tachycardia and tachypnea and leukocytosis   Multifocal pneumonia - This is mainly in both lower lobes. --procal elevated at 0.93 --started on ceftriaxone and azithromycin --cont ceftriaxone and azithromycin  Chronic anemia Hx of iron def --has had recurrent 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 vascular ectasia requiring frequent blood transfusion in the past.  Currently no signs of bleeding. --s/p 1u pRBC for Hgb 6.5  Plan: --cont iron supplement --transfuse to keep Hgb >7  Hx of hypotension --on home midodrine 10 mg TID --taper midodrine down to '5mg'$  TID  Left Nephrostomy status (HCC) --Urine cx collected on presentation, grew multiple species, likely chronic colonization.     Dyslipidemia - continue statin therapy.   GERD without esophagitis - continue PPI therapy.   Recent cataract surgery --on 06/04/22 --cont eye drops    DVT prophylaxis: Lovenox SQ Code Status: Full code  Family Communication:  Level of care: Med-Surg Dispo:   The patient is from: home Anticipated d/c is to: home Anticipated d/c date is: 1-2 days   Subjective and Interval History:  Pt reported feeling better today.   Objective: Vitals:   06/13/22 0125 06/13/22 0356 06/13/22 0804 06/13/22 1531  BP: (!) 102/50 (!) 107/56 (!) 133/56 (!) 116/49  Pulse: 74 66 86 69  Resp: '18 16 20 19  '$ Temp: 98 F (36.7 C) 98 F (36.7 C) 98.3 F (36.8 C)  98.3 F (36.8 C)  TempSrc:  Oral    SpO2: 97% 97% 93% 94%    Intake/Output Summary (Last 24 hours) at 06/13/2022 1931 Last data filed at 06/13/2022 1534 Gross per 24 hour  Intake 720 ml  Output 750 ml  Net -30 ml   There were no vitals filed for this visit.  Examination:   Constitutional: NAD, AAOx3 CV: No cyanosis.   RESP: normal respiratory effort, on 2L Neuro: II - XII grossly intact.   Psych: Normal mood and affect.  Appropriate judgement and reason   Data Reviewed: I have personally reviewed labs and imaging studies   Enzo Bi, MD [facility] If 7PM-7AM, please contact night-coverage 06/13/2022, 7:31 PM

## 2022-06-13 NOTE — Care Management Important Message (Signed)
Important Message  Patient Details  Name: Courtney Mack MRN: [id number] Date of Birth: 1955/10/31   Medicare Important Message Given:  N/A - LOS <3 / Initial given by admissions     [name] 06/13/2022, 2:13 PM

## 2022-06-14 ENCOUNTER — Ambulatory Visit: Admission: RE | Admit: 2022-06-14 | Payer: Medicare HMO | Source: Home / Self Care | Admitting: Ophthalmology

## 2022-06-14 DIAGNOSIS — A419 Sepsis, unspecified organism: Secondary | ICD-10-CM | POA: Diagnosis not present

## 2022-06-14 DIAGNOSIS — J189 Pneumonia, unspecified organism: Secondary | ICD-10-CM | POA: Diagnosis not present

## 2022-06-14 LAB — CBC
HCT: 24.3 % — ABNORMAL LOW (ref 36.0–46.0)
Hemoglobin: 7.7 g/dL — ABNORMAL LOW (ref 12.0–15.0)
MCH: 28.2 pg (ref 26.0–34.0)
MCHC: 31.7 g/dL (ref 30.0–36.0)
MCV: 89 fL (ref 80.0–100.0)
Platelets: 394 10*3/uL (ref 150–400)
RBC: 2.73 MIL/uL — ABNORMAL LOW (ref 3.87–5.11)
RDW: 16.3 % — ABNORMAL HIGH (ref 11.5–15.5)
WBC: 5 10*3/uL (ref 4.0–10.5)
nRBC: 0 % (ref 0.0–0.2)

## 2022-06-14 LAB — BASIC METABOLIC PANEL
Anion gap: 8 (ref 5–15)
BUN: 12 mg/dL (ref 8–23)
CO2: 22 mmol/L (ref 22–32)
Calcium: 8.4 mg/dL — ABNORMAL LOW (ref 8.9–10.3)
Chloride: 109 mmol/L (ref 98–111)
Creatinine, Ser: 0.71 mg/dL (ref 0.44–1.00)
GFR, Estimated: >60 mL/min (ref >60)
Glucose, Bld: 121 mg/dL — ABNORMAL HIGH (ref 70–99)
Potassium: 3.7 mmol/L (ref 3.5–5.1)
Sodium: 139 mmol/L (ref 135–145)

## 2022-06-14 LAB — MAGNESIUM: Magnesium: 1.9 mg/dL (ref 1.7–2.4)

## 2022-06-14 SURGERY — PHACOEMULSIFICATION, CATARACT, WITH IOL INSERTION
Anesthesia: Topical | Laterality: Left

## 2022-06-14 MED ORDER — LEVOFLOXACIN 500 MG PO TABS: 500.0000 mg | ORAL_TABLET | Freq: Every day | ORAL | 0 refills | Status: AC

## 2022-06-14 MED ORDER — AZITHROMYCIN 500 MG PO TABS: 500.0000 mg | ORAL_TABLET | Freq: Every day | ORAL | 0 refills | Status: AC

## 2022-06-14 MED ORDER — LEVOFLOXACIN 500 MG PO TABS: 500.0000 mg | ORAL_TABLET | Freq: Every day | ORAL | 0 refills | Status: DC

## 2022-06-14 MED ORDER — AZITHROMYCIN 500 MG PO TABS: 500.0000 mg | ORAL_TABLET | Freq: Every day | ORAL | 0 refills | Status: DC

## 2022-06-14 MED ORDER — GUAIFENESIN-DM 100-10 MG/5ML PO SYRP: 10.0000 mL | ORAL_SOLUTION | Freq: Four times a day (QID) | ORAL | 0 refills | Status: AC | PRN

## 2022-06-14 NOTE — Progress Notes (Signed)
Spoke with patient. Daughter will be driving her home and will be here around 11am.

## 2022-06-14 NOTE — Progress Notes (Signed)
Patient given discharge instructions. Verbalized understanding of all instructions. Patient states she has been educated on drain and is able to take care of it. Awaiting pick up from daughter.

## 2022-06-14 NOTE — Progress Notes (Signed)
Patient OOF via w/c with volunteer services in stable condition.

## 2022-06-14 NOTE — TOC Transition Note (Signed)
Transition of Care [facility]) - CM/SW Discharge Note   Patient Details  Name: Courtney Mack MRN: [id number] Date of Birth: [birth year]  Transition of Care [facility]) CM/SW Contact:  Colen Darling, [place] Phone Number: 06/14/2022, 10:00 AM   Clinical Narrative:     TOC spoke to patient's daughter Johnny Bridge and the patient has been accepted to [facility] for RN and PT. TOC spoke to [employer]. Daughter will provide transportation home today.  Final next level of care: Home w Home Health Services Barriers to Discharge: Barriers Resolved   Patient Goals and CMS Choice Patient states their goals for this hospitalization and ongoing recovery are:: Patietn wants to be well CMS Medicare.gov Compare Post Acute Care list provided to:: Patient Choice offered to / list presented to : Patient  Discharge Placement                Patient to be transferred to facility by: Family Name of family member notified: Johnny [phone] Patient and family notified of of transfer: 06/14/22  Discharge Plan and Services   Discharge Planning Services: CM Consult Post Acute Care Choice: Home Health          DME Arranged: N/A DME Agency: NA     Representative spoke with at DME Agency: [place] HH Arranged: RN, PT [facility] Agency: [place] Date [place]: 06/14/22 Time [employer]: [phone] Representative spoke with at [place]: [place]  Social Determinants of Health ([place]) Interventions     Readmission Risk Interventions    06/12/2022   11:45 AM 04/18/2022    2:50 PM 03/14/2022    3:51 PM  Readmission Risk Prevention Plan  Transportation Screening Complete Complete Complete  Medication Review [employer]) Complete Complete Complete  PCP or Specialist appointment within 3-5 days of discharge Complete Complete   HRI or Home Care Consult Complete  Complete  SW Recovery Care/Counseling Consult Complete  Complete Complete  Palliative Care Screening Not Applicable Not Applicable Not [place] Not Applicable Not Applicable Not Applicable

## 2022-06-14 NOTE — Discharge Summary (Signed)
Physician Discharge Summary   CHANDELL ATTRIDGE  female DOB: 1956/03/01  STM:[id number]  PCP: [facility] date: 06/11/2022 Discharge date: 06/14/2022  Admitted From: home Disposition:  home Home Health: Yes CODE STATUS: Full code  Discharge Instructions     Discharge instructions   Complete by: As directed    You were treated for pneumonia, have received 3 days of IV antibiotics and is doing well.  Please finish 2 more days of oral antibiotics with Levaquin and azithromycin starting this evening 06/14/22.   Dr. Enzo Bi [facility] Course:  For full details, please see H&P, progress notes, consult notes and ancillary notes.  Briefly,  HEILEY SHAIKH is a 66 y.o. Caucasian female with medical history significant for CHF, type diabetes mellitus, hypertension, DVT and PE, presented to the ER with acute onset of worsening cough with inability to expectorate and associated dyspnea with generalized weakness.    * Sepsis due to pneumonia [facility]) - presented with tachycardia and tachypnea and leukocytosis   Multifocal pneumonia - This is mainly in both lower lobes. --procal elevated at 0.93 --pt was started on ceftriaxone and azithromycin on admission and reported rapid improvement in her symptoms.   --pt received 3 days of ceftriaxone and azithromycin and discharged on 2 more days of Levaquin and azithromycin.     Chronic anemia Hx of iron def --has had recurrent 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 vascular ectasia requiring frequent blood transfusion in the past.  Currently no signs of bleeding. --s/p 1u pRBC for Hgb 6.5  --cont iron supplement   Hx of hypotension --on home midodrine 10 mg TID   Left Nephrostomy status (HCC) UTI, ruled out --Urine cx collected on presentation, initially grew multiple species, regrowth grew 80,000 Enterococcus.  Likely chronic colonization.  Pt denied urinary symptoms.   Dyslipidemia -  continue statin therapy.   GERD without esophagitis - continue PPI therapy.   Recent cataract surgery --on 06/04/22 --cont home eye drops  Chronic diastolic CHF --stable   Discharge Diagnoses:  Principal Problem:   Sepsis due to pneumonia [facility]) Active Problems:   Multifocal pneumonia   Dyslipidemia   GERD without esophagitis   Nephrostomy status ([place])   Generalized weakness   30 Day Unplanned Readmission Risk Score    Flowsheet Row ED to Hosp-Admission (Current) from 06/11/2022 in [place]  30 Day Unplanned Readmission Risk Score (%) 54.87 Filed at 06/14/2022 0801       This score is the patient's risk of an unplanned readmission within 30 days of being discharged (0 -100%). The score is based on dignosis, age, lab data, medications, orders, and past utilization.   Low:  0-14.9   Medium: 15-21.9   High: 22-29.9   Extreme: 30 and above         Discharge Instructions:  Allergies as of 06/14/2022   No Known Allergies      Medication List     TAKE these medications    acetaminophen 500 MG tablet Commonly known as: TYLENOL Take 500 mg by mouth every 6 (six) hours as needed for mild pain or fever.   azithromycin 500 MG tablet Commonly known as: Zithromax Take 1 tablet (500 mg total) by mouth daily for 2 days.   Difluprednate 0.05 % Emul Place 1 drop into the right eye 2 (two) times daily.   ferrous sulfate 325 (65 FE) MG tablet Take 1  tablet (325 mg total) by mouth 2 (two) times daily with a meal.   fluticasone furoate-vilanterol 100-25 MCG/ACT Aepb Commonly known as: BREO ELLIPTA Inhale 1 puff into the lungs daily.   furosemide 20 MG tablet Commonly known as: Lasix Take 1 tablet (20 mg total) by mouth daily. Home med.   guaiFENesin-dextromethorphan 100-10 MG/5ML syrup Commonly known as: ROBITUSSIN DM Take 10 mLs by mouth every 6 (six) hours as needed for up to 7 days for cough.   ketorolac 0.5 %  ophthalmic solution Commonly known as: ACULAR Place 1 drop into the right eye 4 (four) times daily.   levofloxacin 500 MG tablet Commonly known as: Levaquin Take 1 tablet (500 mg total) by mouth daily for 2 days.   midodrine 10 MG tablet Commonly known as: PROAMATINE Take 1 tablet (10 mg total) by mouth 3 (three) times daily with meals.   moxifloxacin 0.5 % ophthalmic solution Commonly known as: VIGAMOX Place 1 drop into the right eye 3 (three) times daily.   multivitamin with minerals Tabs tablet Take 1 tablet by mouth daily.   pantoprazole 40 MG tablet Commonly known as: PROTONIX Take 1 tablet (40 mg total) by mouth 2 (two) times daily.   polyethylene glycol 17 g packet Commonly known as: MIRALAX / GLYCOLAX Take 17 g by mouth daily as needed for mild constipation.   rosuvastatin 10 MG tablet Commonly known as: CRESTOR Take 1 tablet (10 mg total) by mouth daily. What changed: when to take this   vitamin C 1000 MG tablet Take 1,000 mg by mouth daily.         Follow-up [place], [place] Follow up in 1 week(s).   Specialty: General Practice Contact information: [place] [address] [phone]                 No Known Allergies   The results of significant diagnostics from this hospitalization (including imaging, microbiology, ancillary and laboratory) are listed below for reference.   Consultations:   Procedures/Studies: DG Chest 2 View  Result Date: 06/11/2022 CLINICAL DATA:  Cough, shortness of breath EXAM: CHEST - 2 VIEW COMPARISON:  03/12/2022 FINDINGS: Cardiac size is within normal limits. There are no signs of pulmonary edema. New patchy infiltrates are seen in the medial aspects of both lower lung fields. There is no pleural effusion or pneumothorax. IMPRESSION: New patchy infiltrates are seen in the posterior aspects of both lower lung fields suggesting multifocal pneumonia.  Electronically Signed   By: Elmer Picker M.D.   On: 06/11/2022 16:31   IR NEPHROSTOMY EXCHANGE LEFT  Result Date: 05/22/2022 INDICATION: Chronic nephrostomy tube EXAM: Exchange of left percutaneous nephrostomy tube using fluoroscopy COMPARISON:  None Available. MEDICATIONS: None ANESTHESIA/SEDATION: Local analgesia CONTRAST:  80 mL Omnipaque 300-administered into the collecting system(s) FLUOROSCOPY TIME:  Fluoroscopy Time: 0.7 minutes (11 mGy) COMPLICATIONS: None immediate. PROCEDURE: Informed written consent was obtained from the patient after a thorough discussion of the procedural risks, benefits and alternatives. All questions were addressed. Maximal Sterile Barrier Technique was utilized including caps, mask, sterile gowns, sterile gloves, sterile drape, hand hygiene and skin antiseptic. A timeout was performed prior to the initiation of the procedure. The patient was placed prone on the exam table. The left flank was prepped and draped in the standard sterile fashion with inclusion of the existing left percutaneous nephrostomy tube within the sterile field. Contrast injection of the existing nephrostomy tube demonstrated location within  the left renal pelvis. Locking loop was released, and over an Amplatz wire, the existing nephrostomy tube was exchanged for a new 10.2 French percutaneous nephrostomy tube. Location was confirmed within the renal pelvis with additional contrast injection. Locking loop was formed, and the catheter was secured to the skin using silk suture and a dressing. It attached to bag drainage. The patient tolerated the procedure well. IMPRESSION: Successful routine exchange of left percutaneous nephrostomy tube for a new, similar 10 French percutaneous nephrostomy tube. Patient to return to Interventional Radiology in 8 weeks for routine exchange. Electronically Signed   By: Albin Felling M.D.   On: 05/22/2022 14:30   MM 3D SCREEN BREAST BILATERAL  Result Date:  05/21/2022 CLINICAL DATA:  Screening. EXAM: DIGITAL SCREENING BILATERAL MAMMOGRAM WITH TOMOSYNTHESIS AND CAD TECHNIQUE: Bilateral screening digital craniocaudal and mediolateral oblique mammograms were obtained. Bilateral screening digital breast tomosynthesis was performed. The images were evaluated with computer-aided detection. Best images possible per technologist communication. COMPARISON:  Previous exam(s). ACR Breast Density Category b: There are scattered areas of fibroglandular density. FINDINGS: There are no findings suspicious for malignancy. IMPRESSION: No mammographic evidence of malignancy. A result letter of this screening mammogram will be mailed directly to the patient. RECOMMENDATION: Screening mammogram in one year. (Code:[id number]) BI-RADS CATEGORY  1: Negative. Electronically Signed   By: Valentino Saxon M.D.   On: 05/21/2022 09:13      Labs: BNP (last 3 results) Recent Labs    02/23/22 2327 03/26/22 1942 04/16/22 2325  BNP 51.9 86.5 38.2   Basic Metabolic Panel: Recent Labs  Lab 06/11/22 1544 06/12/22 0555 06/13/22 0546 06/14/22 0425  NA 137 137 137 139  K 4.0 3.8 3.8 3.7  CL 108 110 108 109  CO2 '23 22 23 22  '$ GLUCOSE 109* 100* 109* 121*  BUN '15 14 13 12  '$ CREATININE 0.74 0.68 0.74 0.71  CALCIUM 8.9 8.3* 8.3* 8.4*  MG  --   --  1.9 1.9   Liver Function Tests: No results for input(s): "AST", "ALT", "ALKPHOS", "BILITOT", "PROT", "ALBUMIN" in the last 168 hours. No results for input(s): "LIPASE", "AMYLASE" in the last 168 hours. No results for input(s): "AMMONIA" in the last 168 hours. CBC: Recent Labs  Lab 06/11/22 1544 06/12/22 0555 06/13/22 0546 06/14/22 0425  WBC 10.8* 5.6 4.8 5.0  HGB 8.4* 6.5* 7.7* 7.7*  HCT 27.3* 21.3* 25.0* 24.3*  MCV 90.4 92.2 90.6 89.0  PLT 461* 353 388 394   Cardiac Enzymes: No results for input(s): "CKTOTAL", "CKMB", "CKMBINDEX", "TROPONINI" in the last 168 hours. BNP: Invalid input(s): "POCBNP" CBG: No results for  input(s): "GLUCAP" in the last 168 hours. D-Dimer No results for input(s): "DDIMER" in the last 72 hours. Hgb A1c No results for input(s): "HGBA1C" in the last 72 hours. Lipid Profile No results for input(s): "CHOL", "HDL", "LDLCALC", "TRIG", "CHOLHDL", "LDLDIRECT" in the last 72 hours. Thyroid function studies No results for input(s): "TSH", "T4TOTAL", "T3FREE", "THYROIDAB" in the last 72 hours.  Invalid input(s): "FREET3" Anemia work up No results for input(s): "VITAMINB12", "FOLATE", "FERRITIN", "TIBC", "IRON", "RETICCTPCT" in the last 72 hours. Urinalysis    Component Value Date/Time   COLORURINE YELLOW (A) 06/12/2022 0555   APPEARANCEUR CLOUDY (A) 06/12/2022 0555   APPEARANCEUR Cloudy (A) 01/03/2022 1443   LABSPEC 1.016 06/12/2022 0555   PHURINE 6.0 06/12/2022 0555   GLUCOSEU NEGATIVE 06/12/2022 0555   HGBUR NEGATIVE 06/12/2022 0555   BILIRUBINUR NEGATIVE 06/12/2022 0555   BILIRUBINUR Negative 01/03/2022 1443   KETONESUR  NEGATIVE 06/12/2022 0555   PROTEINUR 100 (A) 06/12/2022 0555   NITRITE POSITIVE (A) 06/12/2022 0555   LEUKOCYTESUR MODERATE (A) 06/12/2022 0555   Sepsis Labs Recent Labs  Lab 06/11/22 1544 06/12/22 0555 06/13/22 0546 06/14/22 0425  WBC 10.8* 5.6 4.8 5.0   Microbiology Recent Results (from the past 240 hour(s))  Resp Panel by RT-PCR (Flu A&B, Covid) Anterior Nasal Swab     Status: None   Collection Time: 06/11/22  3:44 PM   Specimen: Anterior Nasal Swab  Result Value Ref Range Status   SARS Coronavirus 2 by RT PCR NEGATIVE NEGATIVE Final    Comment: (NOTE) SARS-CoV-2 target nucleic acids are NOT DETECTED.  The SARS-CoV-2 RNA is generally detectable in upper respiratory specimens during the acute phase of infection. The lowest concentration of SARS-CoV-2 viral copies this assay can detect is 138 copies/mL. A negative result does not preclude SARS-Cov-2 infection and should not be used as the sole basis for treatment or other patient management  decisions. A negative result may occur with  improper specimen collection/handling, submission of specimen other than nasopharyngeal swab, presence of viral mutation(s) within the areas targeted by this assay, and inadequate number of viral copies(<138 copies/mL). A negative result must be combined with clinical observations, patient history, and epidemiological information. The expected result is Negative.  Fact Sheet for Patients:  EntrepreneurPulse.com.au  Fact Sheet for Healthcare Providers:  IncredibleEmployment.be  This test is no t yet approved or cleared by the [place] FDA and  has been authorized for detection and/or diagnosis of SARS-CoV-2 by FDA under an Emergency Use Authorization (EUA). This EUA will remain  in effect (meaning this test can be used) for the duration of the COVID-19 declaration under Section 564(b)(1) of the Act, 21 U.S.C.section 360bbb-3(b)(1), unless the authorization is terminated  or revoked sooner.       Influenza A by PCR NEGATIVE NEGATIVE Final   Influenza B by PCR NEGATIVE NEGATIVE Final    Comment: (NOTE) The Xpert Xpress SARS-CoV-2/FLU/RSV plus assay is intended as an aid in the diagnosis of influenza from Nasopharyngeal swab specimens and should not be used as a sole basis for treatment. Nasal washings and aspirates are unacceptable for Xpert Xpress SARS-CoV-2/FLU/RSV testing.  Fact Sheet for Patients: EntrepreneurPulse.com.au  Fact Sheet for Healthcare Providers: IncredibleEmployment.be  This test is not yet approved or cleared by the [place] FDA and has been authorized for detection and/or diagnosis of SARS-CoV-2 by FDA under an Emergency Use Authorization (EUA). This EUA will remain in effect (meaning this test can be used) for the duration of the COVID-19 declaration under Section 564(b)(1) of the Act, 21 U.S.C. section 360bbb-3(b)(1), unless the  authorization is terminated or revoked.  Performed at [facility], [address], [place], [place] [address]   Urine Culture     Status: Abnormal   Collection Time: 06/12/22 12:05 AM   Specimen: Urine, Clean Catch  Result Value Ref Range Status   Specimen Description   Final    URINE, CLEAN CATCH Performed at [facility], [address], [place], [place] [address]    Special Requests   Final    NONE Performed at [facility], [address] [place], [place] [address]    Culture MULTIPLE SPECIES PRESENT, SUGGEST RECOLLECTION (A)  Final   Report Status 06/13/2022 FINAL  Final  Urine Culture     Status: Abnormal (Preliminary result)   Collection Time: 06/12/22  5:55 AM   Specimen: Urine, Catheterized  Result Value Ref Range Status   Specimen Description   Final    URINE, CATHETERIZED Performed at [facility], [address] [place], [place] [address]    Special Requests   Final    NONE Performed at [facility], [place], [place] [address]    Culture (A)  Final    80,000 COLONIES/mL ENTEROCOCCUS FAECALIS REPEATING SUSCEPTIBILITY Performed at [facility], [place] [address] [place], [place] [address]    Report Status PENDING  Incomplete     Total time spend on discharging this patient, including the last patient exam, discussing the hospital stay, instructions for ongoing care as it relates to all pertinent caregivers, as well as preparing the medical discharge records, prescriptions, and/or referrals as applicable, is 30 minutes.    Enzo Bi, MD  [facility] 06/14/2022, 9:47 AM

## 2022-06-15 LAB — TYPE AND SCREEN
ABO/RH(D): A POS
Antibody Screen: POSITIVE
Donor AG Type: NEGATIVE
Unit division: 0
Unit division: 0
Unit division: 0

## 2022-06-15 LAB — BPAM RBC
Blood Product Expiration Date: 202401032359
Blood Product Expiration Date: 202401092359
Blood Product Expiration Date: 202401102359
ISSUE DATE / TIME: 202312070051
Unit Type and Rh: 5100
Unit Type and Rh: 6200
Unit Type and Rh: 6200

## 2022-06-15 LAB — URINE CULTURE: Culture: 80000 — AB

## 2022-06-21 ENCOUNTER — Ambulatory Visit: Payer: Medicaid Other

## 2022-06-25 ENCOUNTER — Encounter: Payer: Self-pay | Admitting: Ophthalmology

## 2022-07-04 NOTE — Discharge Instructions (Signed)

## 2022-07-09 ENCOUNTER — Other Ambulatory Visit: Payer: Self-pay

## 2022-07-09 ENCOUNTER — Ambulatory Visit: Payer: Medicare HMO | Admitting: Anesthesiology

## 2022-07-09 ENCOUNTER — Encounter
Admission: RE | Disposition: A | Discharge status: Home / Self Care | Payer: Self-pay | Source: Home / Self Care | Attending: Ophthalmology

## 2022-07-09 ENCOUNTER — Ambulatory Visit
Admission: RE | Admit: 2022-07-09 | Discharge: 2022-07-09 | Disposition: A | Discharge status: Home / Self Care | Payer: Medicare HMO | Attending: Ophthalmology | Admitting: Ophthalmology

## 2022-07-09 ENCOUNTER — Encounter: Payer: Self-pay | Admitting: Ophthalmology

## 2022-07-09 DIAGNOSIS — H2512 Age-related nuclear cataract, left eye: Secondary | ICD-10-CM | POA: Insufficient documentation

## 2022-07-09 DIAGNOSIS — I110 Hypertensive heart disease with heart failure: Secondary | ICD-10-CM | POA: Diagnosis not present

## 2022-07-09 DIAGNOSIS — K219 Gastro-esophageal reflux disease without esophagitis: Secondary | ICD-10-CM | POA: Insufficient documentation

## 2022-07-09 DIAGNOSIS — I509 Heart failure, unspecified: Secondary | ICD-10-CM | POA: Insufficient documentation

## 2022-07-09 DIAGNOSIS — E1136 Type 2 diabetes mellitus with diabetic cataract: Secondary | ICD-10-CM | POA: Insufficient documentation

## 2022-07-09 DIAGNOSIS — D649 Anemia, unspecified: Secondary | ICD-10-CM | POA: Diagnosis not present

## 2022-07-09 HISTORY — PX: CATARACT EXTRACTION W/PHACO: SHX586

## 2022-07-09 SURGERY — PHACOEMULSIFICATION, CATARACT, WITH IOL INSERTION
Anesthesia: Monitor Anesthesia Care | Site: Eye | Laterality: Left

## 2022-07-09 MED ORDER — SIGHTPATH DOSE#1 BSS IO SOLN
INTRAOCULAR | Status: DC | PRN
  Administered 2022-07-09: 71 mL via OPHTHALMIC

## 2022-07-09 MED ORDER — MOXIFLOXACIN HCL 0.5 % OP SOLN
OPHTHALMIC | Status: DC | PRN
  Administered 2022-07-09: .2 mL via OPHTHALMIC

## 2022-07-09 MED ORDER — FENTANYL CITRATE (PF) 100 MCG/2ML IJ SOLN
INTRAMUSCULAR | Status: DC | PRN
  Administered 2022-07-09: 50 ug via INTRAVENOUS

## 2022-07-09 MED ORDER — MIDAZOLAM HCL 2 MG/2ML IJ SOLN
INTRAMUSCULAR | Status: DC | PRN
  Administered 2022-07-09 (×2): 1 mg via INTRAVENOUS

## 2022-07-09 MED ORDER — LACTATED RINGERS IV SOLN: INTRAVENOUS | Status: DC

## 2022-07-09 MED ORDER — TETRACAINE HCL 0.5 % OP SOLN
1.0000 [drp] | OPHTHALMIC | Status: DC | PRN
  Administered 2022-07-09 (×3): 1 [drp] via OPHTHALMIC

## 2022-07-09 MED ORDER — ARMC OPHTHALMIC DILATING DROPS
1.0000 | OPHTHALMIC | Status: DC | PRN
  Administered 2022-07-09 (×3): 1 via OPHTHALMIC

## 2022-07-09 MED ORDER — BRIMONIDINE TARTRATE-TIMOLOL 0.2-0.5 % OP SOLN
OPHTHALMIC | Status: DC | PRN
  Administered 2022-07-09: 1 [drp] via OPHTHALMIC

## 2022-07-09 MED ORDER — SIGHTPATH DOSE#1 NA CHONDROIT SULF-NA HYALURON 40-17 MG/ML IO SOLN
INTRAOCULAR | Status: DC | PRN
  Administered 2022-07-09: 1 mL via INTRAOCULAR

## 2022-07-09 MED ORDER — SIGHTPATH DOSE#1 BSS IO SOLN
INTRAOCULAR | Status: DC | PRN
  Administered 2022-07-09: 15 mL

## 2022-07-09 MED ORDER — SIGHTPATH DOSE#1 BSS IO SOLN
INTRAOCULAR | Status: DC | PRN
  Administered 2022-07-09: 1 mL

## 2022-07-09 SURGICAL SUPPLY — 15 items
CANNULA ANT/CHMB 27G (MISCELLANEOUS) IMPLANT
CANNULA ANT/CHMB 27GA (MISCELLANEOUS) IMPLANT
CATARACT SUITE SIGHTPATH (MISCELLANEOUS) ×1 IMPLANT
FEE CATARACT SUITE SIGHTPATH (MISCELLANEOUS) ×1 IMPLANT
GLOVE SURG ENC TEXT LTX SZ8 (GLOVE) ×1 IMPLANT
GLOVE SURG TRIUMPH 8.0 PF LTX (GLOVE) ×1 IMPLANT
LENS IOL TECNIS EYHANCE 24.5 (Intraocular Lens) IMPLANT
NDL FILTER BLUNT 18X1 1/2 (NEEDLE) ×1 IMPLANT
NEEDLE FILTER BLUNT 18X1 1/2 (NEEDLE) ×1 IMPLANT
PACK VIT ANT 23G (MISCELLANEOUS) IMPLANT
RING MALYGIN (MISCELLANEOUS) IMPLANT
SUT ETHILON 10-0 CS-B-6CS-B-6 (SUTURE)
SUTURE EHLN 10-0 CS-B-6CS-B-6 (SUTURE) IMPLANT
SYR 3ML LL SCALE MARK (SYRINGE) ×1 IMPLANT
WATER STERILE IRR 250ML POUR (IV SOLUTION) ×1 IMPLANT

## 2022-07-09 NOTE — Transfer of Care (Signed)
Immediate Anesthesia Transfer of Care Note  Patient: Courtney Mack  Procedure(s) Performed: CATARACT EXTRACTION PHACO AND INTRAOCULAR LENS PLACEMENT (IOC) (Left: Eye)  Patient Location: PACU  Anesthesia Type: MAC  Level of Consciousness: awake, alert  and patient cooperative  Airway and Oxygen Therapy: Patient Spontanous Breathing and Patient connected to supplemental oxygen  Post-op Assessment: Post-op Vital signs reviewed, Patient's Cardiovascular Status Stable, Respiratory Function Stable, Patent Airway and No signs of Nausea or vomiting  Post-op Vital Signs: Reviewed and stable  Complications: No notable events documented.

## 2022-07-09 NOTE — Op Note (Signed)
PREOPERATIVE DIAGNOSIS:  Nuclear sclerotic cataract of the left eye.   POSTOPERATIVE DIAGNOSIS:  Nuclear sclerotic cataract of the left eye.   OPERATIVE PROCEDURE:ORPROCALL@   SURGEON:  Birder Robson, MD.   ANESTHESIA:  Anesthesiologist: Martha Clan, MD CRNA: Moises Blood, CRNA  1.      Managed anesthesia care. 2.     0.11m of Shugarcaine was instilled following the paracentesis   COMPLICATIONS:  None.   TECHNIQUE:   Stop and chop   DESCRIPTION OF PROCEDURE:  The patient was examined and consented in the preoperative holding area where the aforementioned topical anesthesia was applied to the left eye and then brought back to the Operating Room where the left eye was prepped and draped in the usual sterile ophthalmic fashion and a lid speculum was placed. A paracentesis was created with the side port blade and the anterior chamber was filled with viscoelastic. A near clear corneal incision was performed with the steel keratome. A continuous curvilinear capsulorrhexis was performed with a cystotome followed by the capsulorrhexis forceps. Hydrodissection and hydrodelineation were carried out with BSS on a blunt cannula. The lens was removed in a stop and chop  technique and the remaining cortical material was removed with the irrigation-aspiration handpiece. The capsular bag was inflated with viscoelastic and the Technis [id number] lens was placed in the capsular bag without complication. The remaining viscoelastic was removed from the eye with the irrigation-aspiration handpiece. The wounds were hydrated. The anterior chamber was flushed with BSS and the eye was inflated to physiologic pressure. 0.180mVigamox was placed in the anterior chamber. The wounds were found to be water tight. The eye was dressed with Combigan. The patient was given protective glasses to wear throughout the day and a shield with which to sleep tonight. The patient was also given drops with which to begin a drop regimen  today and will follow-up with me in one day. Implant Name Type Inv. Item Serial No. Manufacturer Lot No. LRB No. Used Action  LENS IOL TECNIS EYHANCE 24.5 - [id number] Lens LENS IOL TECNIS EYHANCE 24.5 [id number]  Left 1 Implanted    Procedure(s) with comments: CATARACT EXTRACTION PHACO AND INTRAOCULAR LENS PLACEMENT (IOC) (Left) - 22.75 1:40.3  Electronically signed: WiBirder Robson/08/2022 8:58 AM

## 2022-07-09 NOTE — H&P (Signed)
[facility]   Primary Care Physician:  [facility], [facility] Ophthalmologist: Dr. George Ina  Pre-Procedure History & Physical: HPI:  Courtney Mack is a 67 y.o. female here for cataract surgery.   Past Medical History:  Diagnosis Date   Acute bilateral deep vein thrombosis (DVT) of femoral veins ([place]) 01/02/2021   Acute massive pulmonary embolism ([place]) 08/25/2020   Last Assessment & Plan:  Formatting of this note might be different from the original. Post vena caval filter and on xarelto and O2   Anemia    ARF (acute respiratory failure) ([place])    Cellulitis of left lower leg 06/20/2021   CHF (congestive heart failure) ([place])    COVID-19    Diabetes mellitus without complication ([place])    DVT (deep venous thrombosis) ([place]) 09/06/2020   Endometrial cancer (HCC)    GAVE (gastric antral vascular ectasia)    GERD (gastroesophageal reflux disease)    High cholesterol    Hx of blood clots    Hyperlipidemia    Hypertension    IDA (iron deficiency anemia) 09/21/2020   Obesity    Pneumonia 06/11/2022   recovered   Pressure injury of skin 06/21/2021   Pulmonary embolism ([place])    Sepsis ([place])    Thrombocytopenia ([place])    Urinary tract infection 09/13/2021    Past Surgical History:  Procedure Laterality Date   CATARACT EXTRACTION W/PHACO Right 06/04/2022   Procedure: CATARACT EXTRACTION PHACO AND INTRAOCULAR LENS PLACEMENT (IOC) COMPLICATED RIGHT  70.48  02:17.4;  Surgeon: Birder Robson, MD;  Location: [place];  Service: Ophthalmology;  Laterality: Right;   COLONOSCOPY N/A 06/21/2021   Procedure: COLONOSCOPY;  Surgeon: Toledo, Benay Pike, MD;  Location: [facility];  Service: Gastroenterology;  Laterality: N/A;   CYSTOSCOPY W/ URETERAL STENT PLACEMENT Left 09/11/2021   Procedure: CYSTOSCOPY WITH RETROGRADE PYELOGRAM/URETERAL STENT PLACEMENT;  Surgeon: Abbie Sons, MD;  Location: [facility];  Service: Urology;  Laterality: Left;   CYSTOSCOPY  W/ URETERAL STENT PLACEMENT Left 11/17/2021   Procedure: CYSTOSCOPY WITH STENT REPLACEMENT;  Surgeon: Irine Seal, MD;  Location: [facility];  Service: Urology;  Laterality: Left;   CYSTOSCOPY WITH FULGERATION  11/17/2021   Procedure: CYSTOSCOPY WITH FULGERATION;  Surgeon: Irine Seal, MD;  Location: [facility];  Service: Urology;;   CYSTOSCOPY WITH STENT PLACEMENT Left 10/17/2021   Procedure: CYSTOSCOPY WITH STENT PLACEMENT;  Surgeon: Abbie Sons, MD;  Location: [facility];  Service: Urology;  Laterality: Left;   CYSTOSCOPY WITH URETEROSCOPY, STONE BASKETRY AND STENT PLACEMENT Left 10/16/2021   Procedure: CYSTOSCOPY WITH URETEROSCOPY  AND STENT REMOVAL;  Surgeon: Abbie Sons, MD;  Location: [facility];  Service: Urology;  Laterality: Left;   ENTEROSCOPY N/A 08/17/2021   Procedure: ENTEROSCOPY;  Surgeon: Lin Landsman, MD;  Location: [facility];  Service: Gastroenterology;  Laterality: N/A;   ESOPHAGOGASTRODUODENOSCOPY N/A 06/21/2021   Procedure: ESOPHAGOGASTRODUODENOSCOPY (EGD);  Surgeon: Toledo, Benay Pike, MD;  Location: [facility];  Service: Gastroenterology;  Laterality: N/A;   ESOPHAGOGASTRODUODENOSCOPY (EGD) WITH PROPOFOL N/A 08/17/2021   Procedure: ESOPHAGOGASTRODUODENOSCOPY (EGD) WITH PROPOFOL;  Surgeon: Lin Landsman, MD;  Location: [facility];  Service: Gastroenterology;  Laterality: N/A;   ESOPHAGOGASTRODUODENOSCOPY (EGD) WITH PROPOFOL N/A 10/14/2021   Procedure: ESOPHAGOGASTRODUODENOSCOPY (EGD) WITH PROPOFOL;  Surgeon: [name], MD;  Location: [facility];  Service: Endoscopy;  Laterality: N/A;   ESOPHAGOGASTRODUODENOSCOPY (EGD) WITH PROPOFOL N/A 02/01/2022   Procedure: ESOPHAGOGASTRODUODENOSCOPY (EGD) WITH PROPOFOL;  Surgeon: Lin Landsman, MD;  Location: [place];  Service: Gastroenterology;  Laterality: N/A;   ESOPHAGOGASTRODUODENOSCOPY (EGD) WITH PROPOFOL N/A 03/15/2022   Procedure: ESOPHAGOGASTRODUODENOSCOPY (EGD) WITH PROPOFOL;  Surgeon: Jonathon Bellows, MD;  Location: [facility]COPY;  Service: Gastroenterology;  Laterality: N/A;   ESOPHAGOGASTRODUODENOSCOPY (EGD) WITH PROPOFOL N/A 04/18/2022   Procedure: ESOPHAGOGASTRODUODENOSCOPY (EGD) WITH PROPOFOL;  Surgeon: Lin Landsman, MD;  Location: [facility]COPY;  Service: Gastroenterology;  Laterality: N/A;   GIVENS CAPSULE STUDY N/A 06/22/2021   Procedure: GIVENS CAPSULE STUDY;  Surgeon: Toledo, Benay Pike, MD;  Location: [facility];  Service: Gastroenterology;  Laterality: N/A;   IR NEPHROSTOMY EXCHANGE LEFT  02/25/2022   IR NEPHROSTOMY EXCHANGE LEFT  03/27/2022   IR NEPHROSTOMY EXCHANGE LEFT  05/22/2022   IR NEPHROSTOMY PLACEMENT LEFT  12/27/2021   IVC FILTER INSERTION N/A 08/18/2020   Procedure: IVC FILTER INSERTION;  Surgeon: Katha Cabal, MD;  Location: [place] [facility];  Service: Cardiovascular;  Laterality: N/A;   PERIPHERAL VASCULAR THROMBECTOMY Bilateral 01/03/2021   Procedure: PERIPHERAL VASCULAR THROMBECTOMY;  Surgeon: Algernon Huxley, MD;  Location: [place] [facility];  Service: Cardiovascular;  Laterality: Bilateral;   PULMONARY THROMBECTOMY N/A 08/18/2020   Procedure: PULMONARY THROMBECTOMY;  Surgeon: Katha Cabal, MD;  Location: [place] [facility];  Service: Cardiovascular;  Laterality: N/A;   TUBAL LIGATION     VISCERAL ANGIOGRAPHY N/A 07/18/2021   Procedure: VISCERAL ANGIOGRAPHY;  Surgeon: Algernon Huxley, MD;  Location: [place] [facility];  Service: Cardiovascular;  Laterality: N/A;    Prior to Admission medications   Medication Sig Start Date End Date Taking? Authorizing Provider  acetaminophen (TYLENOL) 500 MG tablet Take 500 mg by mouth every 6 (six) hours as needed for mild pain or fever.   Yes [provider]  Ascorbic Acid (VITAMIN C) 1000 MG tablet Take 1,000 mg by mouth daily.   Yes [provider]  ferrous sulfate 325 (65 FE) MG tablet Take 1 tablet (325 mg total) by mouth 2 (two) times daily with a meal. 03/16/22  Yes Lorella Nimrod, MD  fluticasone furoate-vilanterol (BREO ELLIPTA) 100-25 MCG/ACT AEPB Inhale 1 puff into the lungs daily.   Yes [provider]  furosemide (LASIX) 20 MG tablet Take 1 tablet (20 mg total) by mouth daily. Home med. 04/20/22  Yes Enzo Bi, MD  midodrine (PROAMATINE) 10 MG tablet Take 1 tablet (10 mg total) by mouth 3 (three) times daily with meals. 04/20/22 07/19/22 Yes Enzo Bi, MD  moxifloxacin (VIGAMOX) 0.5 % ophthalmic solution Place 1 drop into the right eye 3 (three) times daily. 05/29/22  Yes [provider]  Multiple Vitamin (MULTIVITAMIN WITH MINERALS) TABS tablet Take 1 tablet by mouth daily. 09/12/20  Yes [name], MD  pantoprazole (PROTONIX) 40 MG tablet Take 1 tablet (40 mg total) by mouth 2 (two) times daily. 03/01/22 07/09/22 Yes Sreenath, Sudheer B, MD  polyethylene glycol (MIRALAX / GLYCOLAX) 17 g packet Take 17 g by mouth daily as needed for mild constipation. 03/29/22  Yes Lavina Hamman, MD  rosuvastatin (CRESTOR) 10 MG tablet Take 1 tablet (10 mg total) by mouth daily. Patient taking differently: Take 10 mg by mouth in the morning. 10/03/20  Yes Earlie Server, MD  Difluprednate 0.05 % EMUL Place 1 drop into the right eye 2 (two) times daily. 05/31/22   [provider]  ketorolac (ACULAR) 0.5 % ophthalmic solution Place 1 drop into the right eye 4 (four) times daily. 05/29/22   [provider]    Allergies as  of 06/21/2022   (No Known Allergies)    Family History  Problem Relation Age of Onset   Hypertension Mother    Diabetes Mother    Diabetes Father    Hypertension Father    High Cholesterol Father    Congestive Heart Failure Father    Breast cancer Cousin     Social History   Socioeconomic History   Marital status: Single    Spouse name: Not on file   Number of children: Not on file   Years of education: Not on file   Highest education level: Not on file  Occupational History   Not on file  Tobacco Use    Smoking status: Never   Smokeless tobacco: Never   Tobacco comments:    smoked 2-4 cigarettes for about 2-3 weeks   Vaping Use   Vaping Use: Never used  Substance and Sexual Activity   Alcohol use: Not Currently   Drug use: Not Currently   Sexual activity: Not Currently  Other Topics Concern   Not on file  Social History Narrative   Not on file   Social Determinants of Health   Financial Resource Strain: Not on file  Food Insecurity: No Food Insecurity (06/12/2022)   Hunger Vital Sign    Worried About Running Out of Food in the Last Year: Never true    Ran Out of Food in the Last Year: Never true  Transportation Needs: No Transportation Needs (06/12/2022)   PRAPARE - Hydrologist (Medical): No    Lack of Transportation (Non-Medical): No  Physical Activity: Not on file  Stress: Not on file  Social Connections: Not on file  Intimate Partner Violence: Not At Risk (06/12/2022)   Humiliation, Afraid, Rape, and Kick questionnaire    Fear of Current or Ex-Partner: No    Emotionally Abused: No    Physically Abused: No    Sexually Abused: No    Review of Systems: See HPI, otherwise negative ROS  Physical Exam: BP 132/66   Pulse 92   Temp (!) 97.5 F (36.4 C) (Temporal)   Ht '4\' 11"'$  (1.499 m)   Wt 72.6 kg   SpO2 100%   BMI 32.32 kg/m  General:   Alert, cooperative in NAD Head:  Normocephalic and atraumatic. Respiratory:  Normal work of breathing. Cardiovascular:  RRR  Impression/Plan: Courtney Mack is here for cataract surgery.  Risks, benefits, limitations, and alternatives regarding cataract surgery have been reviewed with the patient.  Questions have been answered.  All parties agreeable.   Birder Robson, MD  07/09/2022, 8:31 AM

## 2022-07-09 NOTE — Anesthesia Postprocedure Evaluation (Signed)
Anesthesia Post Note  Patient: Courtney Mack  Procedure(s) Performed: CATARACT EXTRACTION PHACO AND INTRAOCULAR LENS PLACEMENT (IOC) (Left: Eye)  Patient location during evaluation: PACU Anesthesia Type: MAC Level of consciousness: awake and alert Pain management: pain level controlled Vital Signs Assessment: post-procedure vital signs reviewed and stable Respiratory status: spontaneous breathing, nonlabored ventilation, respiratory function stable and patient connected to nasal cannula oxygen Cardiovascular status: stable and blood pressure returned to baseline Postop Assessment: no apparent nausea or vomiting Anesthetic complications: no   No notable events documented.   Last Vitals:  Vitals:   07/09/22 0903 07/09/22 0904  BP:  113/64  Pulse: 86 82  Resp: 18 18  Temp:    SpO2: 100% 99%    Last Pain:  Vitals:   07/09/22 0904  TempSrc:   PainSc: 0-No pain                 Martha Clan

## 2022-07-09 NOTE — Anesthesia Preprocedure Evaluation (Signed)
Anesthesia Evaluation  Patient identified by MRN, date of birth, ID band Patient awake    Reviewed: Allergy & Precautions, H&P , NPO status , Patient's Chart, lab work & pertinent test results, reviewed documented beta blocker date and time   Airway Mallampati: II  TM Distance: >3 FB Neck ROM: full    Dental no notable dental hx. (+) Teeth Intact   Pulmonary neg pulmonary ROS   Pulmonary exam normal breath sounds clear to auscultation       Cardiovascular Exercise Tolerance: Good hypertension, On Medications +CHF   Rhythm:regular Rate:Normal     Neuro/Psych negative neurological ROS  negative psych ROS   GI/Hepatic Neg liver ROS,GERD  Medicated,,  Endo/Other  negative endocrine ROSdiabetes, Well Controlled    Renal/GU Renal disease     Musculoskeletal   Abdominal   Peds  Hematology  (+) Blood dyscrasia, anemia   Anesthesia Other Findings   Reproductive/Obstetrics negative OB ROS                              Anesthesia Physical Anesthesia Plan  ASA: 3  Anesthesia Plan: MAC   Post-op Pain Management:    Induction:   PONV Risk Score and Plan:   Airway Management Planned:   Additional Equipment:   Intra-op Plan:   Post-operative Plan:   Informed Consent: I have reviewed the patients History and Physical, chart, labs and discussed the procedure including the risks, benefits and alternatives for the proposed anesthesia with the patient or authorized representative who has indicated his/her understanding and acceptance.       Plan Discussed with: CRNA  Anesthesia Plan Comments:         Anesthesia Quick Evaluation

## 2022-07-10 ENCOUNTER — Encounter: Payer: Self-pay | Admitting: Ophthalmology

## 2022-07-17 ENCOUNTER — Ambulatory Visit: Admission: RE | Admit: 2022-07-17 | Payer: Medicare HMO | Source: Ambulatory Visit | Admitting: Radiology

## 2022-07-23 NOTE — Progress Notes (Signed)
Patient for IR Neph Tube Exchange on Wed 07/24/2022, I called and spoke with the daughter on the phone and gave pre-procedure instructions. Pt's daughter was made aware to have patient here at 2:30p at the new entrance.  Pt's daughter stated understanding. Called 07/23/2022

## 2022-07-24 ENCOUNTER — Ambulatory Visit: Payer: Medicaid Other | Admitting: Family

## 2022-07-24 ENCOUNTER — Ambulatory Visit
Admission: RE | Admit: 2022-07-24 | Discharge: 2022-07-24 | Disposition: A | Discharge status: Home / Self Care | Payer: Medicare HMO | Source: Ambulatory Visit | Attending: Interventional Radiology | Admitting: Interventional Radiology

## 2022-07-24 ENCOUNTER — Other Ambulatory Visit: Payer: Self-pay | Admitting: Interventional Radiology

## 2022-07-24 DIAGNOSIS — N1330 Unspecified hydronephrosis: Secondary | ICD-10-CM | POA: Insufficient documentation

## 2022-07-24 DIAGNOSIS — Z436 Encounter for attention to other artificial openings of urinary tract: Secondary | ICD-10-CM | POA: Diagnosis present

## 2022-07-24 HISTORY — PX: IR NEPHROSTOMY EXCHANGE LEFT: IMG6069

## 2022-07-24 MED ORDER — IOHEXOL 300 MG/ML  SOLN
5.0000 mL | Freq: Once | INTRAMUSCULAR | Status: AC | PRN
  Administered 2022-07-24: 5 mL

## 2022-07-24 MED ORDER — LIDOCAINE HCL 1 % IJ SOLN
INTRAMUSCULAR | Status: AC
  Administered 2022-07-24: 5 mL
  Filled 2022-07-24: qty 20

## 2022-07-24 NOTE — Procedures (Signed)
Interventional Radiology Procedure Note  Procedure: FLUORO LEFT PCN EXCHG    Complications: None  Estimated Blood Loss:  0  Findings: 10FR PCN EXCHG    Tamera Punt, MD

## 2022-07-31 ENCOUNTER — Ambulatory Visit: Payer: Medicare HMO | Attending: Family | Admitting: Family

## 2022-07-31 VITALS — BP 125/49 | HR 90 | Wt 170.4 lb

## 2022-07-31 DIAGNOSIS — Z87440 Personal history of urinary (tract) infections: Secondary | ICD-10-CM | POA: Diagnosis not present

## 2022-07-31 DIAGNOSIS — Z8616 Personal history of COVID-19: Secondary | ICD-10-CM | POA: Diagnosis not present

## 2022-07-31 DIAGNOSIS — R5383 Other fatigue: Secondary | ICD-10-CM | POA: Diagnosis not present

## 2022-07-31 DIAGNOSIS — N390 Urinary tract infection, site not specified: Secondary | ICD-10-CM

## 2022-07-31 DIAGNOSIS — Z86711 Personal history of pulmonary embolism: Secondary | ICD-10-CM | POA: Insufficient documentation

## 2022-07-31 DIAGNOSIS — I110 Hypertensive heart disease with heart failure: Secondary | ICD-10-CM | POA: Insufficient documentation

## 2022-07-31 DIAGNOSIS — Z86718 Personal history of other venous thrombosis and embolism: Secondary | ICD-10-CM | POA: Insufficient documentation

## 2022-07-31 DIAGNOSIS — E7800 Pure hypercholesterolemia, unspecified: Secondary | ICD-10-CM | POA: Insufficient documentation

## 2022-07-31 DIAGNOSIS — D696 Thrombocytopenia, unspecified: Secondary | ICD-10-CM | POA: Diagnosis not present

## 2022-07-31 DIAGNOSIS — Z7951 Long term (current) use of inhaled steroids: Secondary | ICD-10-CM | POA: Insufficient documentation

## 2022-07-31 DIAGNOSIS — I10 Essential (primary) hypertension: Secondary | ICD-10-CM | POA: Diagnosis not present

## 2022-07-31 DIAGNOSIS — R600 Localized edema: Secondary | ICD-10-CM | POA: Insufficient documentation

## 2022-07-31 DIAGNOSIS — F17210 Nicotine dependence, cigarettes, uncomplicated: Secondary | ICD-10-CM | POA: Insufficient documentation

## 2022-07-31 DIAGNOSIS — Z8542 Personal history of malignant neoplasm of other parts of uterus: Secondary | ICD-10-CM | POA: Insufficient documentation

## 2022-07-31 DIAGNOSIS — I890 Lymphedema, not elsewhere classified: Secondary | ICD-10-CM | POA: Diagnosis not present

## 2022-07-31 DIAGNOSIS — E119 Type 2 diabetes mellitus without complications: Secondary | ICD-10-CM

## 2022-07-31 DIAGNOSIS — R0602 Shortness of breath: Secondary | ICD-10-CM | POA: Insufficient documentation

## 2022-07-31 DIAGNOSIS — I5032 Chronic diastolic (congestive) heart failure: Secondary | ICD-10-CM

## 2022-07-31 DIAGNOSIS — D649 Anemia, unspecified: Secondary | ICD-10-CM

## 2022-07-31 NOTE — Progress Notes (Unsigned)
Patient ID: Courtney Mack, female    DOB: Aug 02, 1955, 67 y.o.   MRN: [id number]  HPI  Courtney Mack is a 67 y/o female with a history of DM, hyperlipidemia, HTN, DVT, PE, anemia, thrombocytopenia, tobacco use and chronic heart failure.    Echo report from 02/25/22 reviewed and showed an EF of 65-70%. Echo report from 09/12/21 reviewed and showed an EF of 55-60% along with mild LVH/ LAE & mild MR.    Admitted 06/11/22 due to pneumonia. Admitted 04/16/22 due to GIB.    She presents today for a follow-up visit with a chief complaint of minimal fatigue with moderate exertion. Describes this as chronic in nature. Has associated shortness of breath and pedal edema (improving) along with this. Denies any dizziness, difficulty sleeping, abdominal distention, palpitations, chest pain, cough or weight gain.   Continues to wear compression boots daily for ~ 1 hour with improvement of lymphedema.   Past Medical History:  Diagnosis Date   Acute bilateral deep vein thrombosis (DVT) of femoral veins ([place]) 01/02/2021   Acute massive pulmonary embolism ([place]) 08/25/2020   Last Assessment & Plan:  Formatting of this note might be different from the original. Post vena caval filter and on xarelto and O2   Anemia    ARF (acute respiratory failure) ([place])    Cellulitis of left lower leg 06/20/2021   CHF (congestive heart failure) ([place])    COVID-19    Diabetes mellitus without complication ([place])    DVT (deep venous thrombosis) ([place]) 09/06/2020   Endometrial cancer (H[facility])    GAVE (gastric antral vascular ectasia)    GERD (gastroesophageal reflux disease)    High cholesterol    Hx of blood clots    Hyperlipidemia    Hypertension    IDA (iron deficiency anemia) 09/21/2020   Obesity    Pneumonia 06/11/2022   recovered   Pressure injury of skin 06/21/2021   Pulmonary embolism ([place])    Sepsis ([place])    Thrombocytopenia ([place])    Urinary tract infection 09/13/2021   Past Surgical History:  Procedure  Laterality Date   CATARACT EXTRACTION W/PHACO Right 06/04/2022   Procedure: CATARACT EXTRACTION PHACO AND INTRAOCULAR LENS PLACEMENT (IOC) COMPLICATED RIGHT  62.69  02:17.4;  Surgeon: Birder Robson, MD;  Location: [facility] [facility];  Service: Ophthalmology;  Laterality: Right;   CATARACT EXTRACTION W/PHACO Left 07/09/2022   Procedure: CATARACT EXTRACTION PHACO AND INTRAOCULAR LENS PLACEMENT (IOC);  Surgeon: Birder Robson, MD;  Location: [place];  Service: Ophthalmology;  Laterality: Left;  22.75 1:40.3   COLONOSCOPY N/A 06/21/2021   Procedure: COLONOSCOPY;  Surgeon: Toledo, Benay Pike, MD;  Location: [facility];  Service: Gastroenterology;  Laterality: N/A;   CYSTOSCOPY W/ URETERAL STENT PLACEMENT Left 09/11/2021   Procedure: CYSTOSCOPY WITH RETROGRADE PYELOGRAM/URETERAL STENT PLACEMENT;  Surgeon: Abbie Sons, MD;  Location: [facility];  Service: Urology;  Laterality: Left;   CYSTOSCOPY W/ URETERAL STENT PLACEMENT Left 11/17/2021   Procedure: CYSTOSCOPY WITH STENT REPLACEMENT;  Surgeon: Irine Seal, MD;  Location: [facility];  Service: Urology;  Laterality: Left;   CYSTOSCOPY WITH FULGERATION  11/17/2021   Procedure: CYSTOSCOPY WITH FULGERATION;  Surgeon: Irine Seal, MD;  Location: [facility];  Service: Urology;;   CYSTOSCOPY WITH STENT PLACEMENT Left 10/17/2021   Procedure: CYSTOSCOPY WITH STENT PLACEMENT;  Surgeon: Abbie Sons, MD;  Location: [facility];  Service: Urology;  Laterality: Left;   CYSTOSCOPY WITH URETEROSCOPY, STONE BASKETRY AND STENT PLACEMENT Left 10/16/2021   Procedure: CYSTOSCOPY WITH  URETEROSCOPY  AND STENT REMOVAL;  Surgeon: Abbie Sons, MD;  Location: [facility];  Service: Urology;  Laterality: Left;   ENTEROSCOPY N/A 08/17/2021   Procedure: ENTEROSCOPY;  Surgeon: Lin Landsman, MD;  Location: [facility]COPY;  Service: Gastroenterology;  Laterality: N/A;   ESOPHAGOGASTRODUODENOSCOPY N/A 06/21/2021   Procedure: ESOPHAGOGASTRODUODENOSCOPY (EGD);   Surgeon: Toledo, Benay Pike, MD;  Location: [facility];  Service: Gastroenterology;  Laterality: N/A;   ESOPHAGOGASTRODUODENOSCOPY (EGD) WITH PROPOFOL N/A 08/17/2021   Procedure: ESOPHAGOGASTRODUODENOSCOPY (EGD) WITH PROPOFOL;  Surgeon: Lin Landsman, MD;  Location: [facility]OSCOPY;  Service: Gastroenterology;  Laterality: N/A;   ESOPHAGOGASTRODUODENOSCOPY (EGD) WITH PROPOFOL N/A 10/14/2021   Procedure: ESOPHAGOGASTRODUODENOSCOPY (EGD) WITH PROPOFOL;  Surgeon: [name], MD;  Location: [facility];  Service: Endoscopy;  Laterality: N/A;   ESOPHAGOGASTRODUODENOSCOPY (EGD) WITH PROPOFOL N/A 02/01/2022   Procedure: ESOPHAGOGASTRODUODENOSCOPY (EGD) WITH PROPOFOL;  Surgeon: Lin Landsman, MD;  Location[facility];  Service: Gastroenterology;  Laterality: N/A;   ESOPHAGOGASTRODUODENOSCOPY (EGD) WITH PROPOFOL N/A 03/15/2022   Procedure: ESOPHAGOGASTRODUODENOSCOPY (EGD) WITH PROPOFOL;  Surgeon: Jonathon Bellows, MD;  Location: [facility] ENDOSCOPY;  Service: Gastroenterology;  Laterality: N/A;   ESOPHAGOGASTRODUODENOSCOPY (EGD) WITH PROPOFOL N/A 04/18/2022   Procedure: ESOPHAGOGASTRODUODENOSCOPY (EGD) WITH PROPOFOL;  Surgeon: Lin Landsman, MD;  Location: [facility] ENDOSCOPY;  Service: Gastroenterology;  Laterality: N/A;   GIVENS CAPSULE STUDY N/A 06/22/2021   Procedure: GIVENS CAPSULE STUDY;  Surgeon: Toledo, Benay Pike, MD;  Location: [facility];  Service: Gastroenterology;  Laterality: N/A;   IR NEPHROSTOMY EXCHANGE LEFT  02/25/2022   IR NEPHROSTOMY EXCHANGE LEFT  03/27/2022   IR NEPHROSTOMY EXCHANGE LEFT  05/22/2022   IR NEPHROSTOMY EXCHANGE LEFT  07/24/2022   IR NEPHROSTOMY PLACEMENT LEFT  12/27/2021   IVC FILTER INSERTION N/A 08/18/2020   Procedure: IVC FILTER INSERTION;  Surgeon: Katha Cabal, MD;  Location: [place] [facility];  Service: Cardiovascular;  Laterality: N/A;   PERIPHERAL VASCULAR THROMBECTOMY Bilateral 01/03/2021   Procedure: PERIPHERAL VASCULAR THROMBECTOMY;  Surgeon:  Algernon Huxley, MD;  Location: [place] [facility];  Service: Cardiovascular;  Laterality: Bilateral;   PULMONARY THROMBECTOMY N/A 08/18/2020   Procedure: PULMONARY THROMBECTOMY;  Surgeon: Katha Cabal, MD;  Location: [place] [facility];  Service: Cardiovascular;  Laterality: N/A;   TUBAL LIGATION     VISCERAL ANGIOGRAPHY N/A 07/18/2021   Procedure: VISCERAL ANGIOGRAPHY;  Surgeon: Algernon Huxley, MD;  Location: [place] [facility];  Service: Cardiovascular;  Laterality: N/A;   Family History  Problem Relation Age of Onset   Hypertension Mother    Diabetes Mother    Diabetes Father    Hypertension Father    High Cholesterol Father    Congestive Heart Failure Father    Breast cancer Cousin    Social History   Tobacco Use   Smoking status: Never   Smokeless tobacco: Never   Tobacco comments:    smoked 2-4 cigarettes for about 2-3 weeks   Substance Use Topics   Alcohol use: Not Currently   No Known Allergies  Prior to Admission medications   Medication Sig Start Date End Date Taking? Authorizing Provider  acetaminophen (TYLENOL) 500 MG tablet Take 500 mg by mouth every 6 (six) hours as needed for mild pain or fever.   Yes [provider]  Ascorbic Acid (VITAMIN C) 1000 MG tablet Take 1,000 mg by mouth daily.   Yes [provider]  Difluprednate 0.05 % EMUL Place 1 drop into the right eye 2 (two) times daily. 05/31/22  Yes [provider]  ferrous sulfate 325 (65 FE) MG tablet Take 1 tablet (325 mg total) by mouth 2 (two) times daily with a meal. 03/16/22  Yes Lorella Nimrod, MD  fluticasone furoate-vilanterol (BREO ELLIPTA) 100-25 MCG/ACT AEPB Inhale 1 puff into the lungs daily.   Yes [provider]  furosemide (LASIX) 20 MG tablet Take 1 tablet (20 mg total) by mouth daily. Home med. 04/20/22  Yes Enzo Bi, MD  ketorolac (ACULAR) 0.5 % ophthalmic solution Place 1 drop into the right eye 4 (four) times daily. 05/29/22  Yes [provider]  moxifloxacin (VIGAMOX) 0.5 % ophthalmic solution Place 1 drop into the right eye 3 (three) times daily. 05/29/22  Yes [provider]  Multiple Vitamin (MULTIVITAMIN WITH MINERALS) TABS tablet Take 1 tablet by mouth daily. 09/12/20  Yes [name], MD  pantoprazole (PROTONIX) 40 MG tablet Take 1 tablet (40 mg total) by mouth 2 (two) times daily. 03/01/22 08/01/23 Yes Sreenath, Sudheer B, MD  polyethylene glycol (MIRALAX / GLYCOLAX) 17 g packet Take 17 g by mouth daily as needed for mild constipation. 03/29/22  Yes Lavina Hamman, MD  rosuvastatin (CRESTOR) 10 MG tablet Take 1 tablet (10 mg total) by mouth daily. Patient taking differently: Take 10 mg by mouth in the morning. 10/03/20  Yes Earlie Server, MD   Review of Systems  Constitutional:  Positive for fatigue. Negative for appetite change.  HENT:  Negative for congestion, postnasal drip and sore throat.   Eyes:  Positive for visual disturbance (due to cataracts).  Respiratory:  Positive for shortness of breath (minimal). Negative for cough and chest tightness.   Cardiovascular:  Positive for leg swelling (improving). Negative for chest pain and palpitations.  Gastrointestinal:  Negative for abdominal distention and abdominal pain.  Endocrine: Negative.   Genitourinary: Negative.   Musculoskeletal:  Positive for back pain (at times). Negative for arthralgias.  Skin: Negative.   Allergic/Immunologic: Negative.   Neurological:  Negative for dizziness and light-headedness.  Hematological:  Negative for adenopathy. Bruises/bleeds easily.  Psychiatric/Behavioral:  Negative for dysphoric mood and sleep disturbance (sleeps in reclincer due to comfort). The patient is not nervous/anxious.    Vitals:   07/31/22 1628  BP: (!) 125/49  Pulse: 90  SpO2: 100%  Weight: 170 lb 6 oz (77.3 kg)   Wt Readings from Last 3 Encounters:  07/31/22 170 lb 6 oz (77.3 kg)  07/09/22 160 lb (72.6 kg)  06/04/22 163 lb (73.9 kg)   Lab Results   Component Value Date   CREATININE 0.71 06/14/2022   CREATININE 0.74 06/13/2022   CREATININE 0.68 06/12/2022   Physical Exam Vitals and nursing note reviewed.  Constitutional:      Appearance: Normal appearance.  HENT:     Head: Normocephalic and atraumatic.  Cardiovascular:     Rate and Rhythm: Normal rate and regular rhythm.  Pulmonary:     Effort: Pulmonary effort is normal. No respiratory distress.     Breath sounds: No wheezing or rales.  Abdominal:     General: There is no distension.     Palpations: Abdomen is soft.  Musculoskeletal:        General: No tenderness.     Cervical back: Normal range of motion and neck supple.     Right lower leg: Edema (+ lymphedema) present.     Left lower leg: Edema (+ lymphedema) present.  Skin:    General: Skin is warm and dry.  Neurological:  General: No focal deficit present.     Mental Status: She is alert and oriented to person, place, and time.  Psychiatric:        Behavior: Behavior normal.        Thought Content: Thought content normal.   Assessment & Plan:   1: Chronic heart failure with preserved ejection fraction without structural changes- - NYHA class II - euvolemic today - weighing daily; reminded to call for an overnight weight gain of > 2 pounds or a weekly weight gain of > 5 pounds - weight down 3 pounds from last visit here 4 months ago - not adding salt except on rare occasions;  - saw cardiology Rockey Situ) 10/04/21 - recurrent UTI's so not a candidate for SGLT2 - BNP 04/16/22 was 87.4   2: HTN- - BP 125/49 - follows with PCP at [facility]; returns 08/06/22 - BMP 06/14/22 reviewed and showed sodium 139, potassium 378, creatinine 0.71 and GFR >60   3: DM- - A1c 11/16/21 was 5.7%   4: Anemia- - hemoglobin 06/14/22 was 7.7 - saw hematology Tasia Catchings) 05/06/22 - iron infusion done 05/06/22  5: Lymphedema- - stage 2 - limited in her ability to exercise due to leg swelling - unable to wear  compression socks - does elevate her legs in a recliner but swelling never completely resolves - saw vascular (Dew) 02/08/22 - wearing compression boots daily with improvement of lymphedema  6: Recurrent UTI- - saw urology Bernardo Heater) 04/23/22 - Underwent nephrogram and nephrostomy tube exchange in the past    Patient did not bring her medications nor a list. Each medication was verbally reviewed with the patient and she was encouraged to bring the bottles to every visit to confirm accuracy of list.   Return in 3 months, sooner if needed.

## 2022-07-31 NOTE — Patient Instructions (Signed)
Continue weighing daily and call for an overnight weight gain of 3 pounds or more or a weekly weight gain of more than 5 pounds.   If you have voicemail, please make sure your mailbox is cleaned out so that we may leave a message and please make sure to listen to any voicemails.     

## 2022-08-01 ENCOUNTER — Encounter: Payer: Self-pay | Admitting: Family

## 2022-08-02 ENCOUNTER — Other Ambulatory Visit: Payer: Medicaid Other

## 2022-08-02 DIAGNOSIS — Z515 Encounter for palliative care: Secondary | ICD-10-CM

## 2022-08-02 NOTE — Progress Notes (Signed)
TELEPHONIC CHECK IN/VISIT   PC SW connected with patient via telephone to complete telephonic check in and review new palliative care criteria.   Hx of endometrial cancer: no longer receiving treatments. Patient states she no longer has cancer.   Functional changes: per patient she is doing well and has not had any changes in her medications.   Plan: palliative care will sign off due to patient not meeting new criteria. Patient states understanding and is in agreement with plan.

## 2022-08-07 ENCOUNTER — Emergency Department: Payer: Medicare HMO

## 2022-08-07 ENCOUNTER — Inpatient Hospital Stay
Admission: EM | Admit: 2022-08-07 | Discharge: 2022-08-10 | DRG: 378 | Disposition: A | Discharge status: Home Health | Payer: Medicare HMO | Attending: Internal Medicine | Admitting: Internal Medicine

## 2022-08-07 ENCOUNTER — Other Ambulatory Visit: Payer: Self-pay

## 2022-08-07 ENCOUNTER — Telehealth: Payer: Self-pay | Admitting: *Deleted

## 2022-08-07 DIAGNOSIS — Z8616 Personal history of COVID-19: Secondary | ICD-10-CM

## 2022-08-07 DIAGNOSIS — R600 Localized edema: Secondary | ICD-10-CM | POA: Diagnosis present

## 2022-08-07 DIAGNOSIS — K31811 Angiodysplasia of stomach and duodenum with bleeding: Secondary | ICD-10-CM | POA: Diagnosis not present

## 2022-08-07 DIAGNOSIS — D649 Anemia, unspecified: Secondary | ICD-10-CM | POA: Diagnosis not present

## 2022-08-07 DIAGNOSIS — Z83438 Family history of other disorder of lipoprotein metabolism and other lipidemia: Secondary | ICD-10-CM

## 2022-08-07 DIAGNOSIS — Z936 Other artificial openings of urinary tract status: Secondary | ICD-10-CM

## 2022-08-07 DIAGNOSIS — E7800 Pure hypercholesterolemia, unspecified: Secondary | ICD-10-CM | POA: Diagnosis present

## 2022-08-07 DIAGNOSIS — E119 Type 2 diabetes mellitus without complications: Secondary | ICD-10-CM | POA: Diagnosis present

## 2022-08-07 DIAGNOSIS — Z6835 Body mass index (BMI) 35.0-35.9, adult: Secondary | ICD-10-CM

## 2022-08-07 DIAGNOSIS — Z79899 Other long term (current) drug therapy: Secondary | ICD-10-CM

## 2022-08-07 DIAGNOSIS — Z961 Presence of intraocular lens: Secondary | ICD-10-CM | POA: Diagnosis present

## 2022-08-07 DIAGNOSIS — Z8542 Personal history of malignant neoplasm of other parts of uterus: Secondary | ICD-10-CM

## 2022-08-07 DIAGNOSIS — E876 Hypokalemia: Secondary | ICD-10-CM | POA: Diagnosis present

## 2022-08-07 DIAGNOSIS — I5032 Chronic diastolic (congestive) heart failure: Secondary | ICD-10-CM

## 2022-08-07 DIAGNOSIS — Z86711 Personal history of pulmonary embolism: Secondary | ICD-10-CM

## 2022-08-07 DIAGNOSIS — Z95828 Presence of other vascular implants and grafts: Secondary | ICD-10-CM

## 2022-08-07 DIAGNOSIS — Z9842 Cataract extraction status, left eye: Secondary | ICD-10-CM

## 2022-08-07 DIAGNOSIS — I9589 Other hypotension: Secondary | ICD-10-CM | POA: Diagnosis not present

## 2022-08-07 DIAGNOSIS — E861 Hypovolemia: Secondary | ICD-10-CM

## 2022-08-07 DIAGNOSIS — E669 Obesity, unspecified: Secondary | ICD-10-CM | POA: Diagnosis present

## 2022-08-07 DIAGNOSIS — I110 Hypertensive heart disease with heart failure: Secondary | ICD-10-CM | POA: Diagnosis present

## 2022-08-07 DIAGNOSIS — Z9841 Cataract extraction status, right eye: Secondary | ICD-10-CM

## 2022-08-07 DIAGNOSIS — K31819 Angiodysplasia of stomach and duodenum without bleeding: Secondary | ICD-10-CM | POA: Diagnosis not present

## 2022-08-07 DIAGNOSIS — Z86718 Personal history of other venous thrombosis and embolism: Secondary | ICD-10-CM

## 2022-08-07 DIAGNOSIS — Z803 Family history of malignant neoplasm of breast: Secondary | ICD-10-CM

## 2022-08-07 DIAGNOSIS — Z7951 Long term (current) use of inhaled steroids: Secondary | ICD-10-CM

## 2022-08-07 DIAGNOSIS — D62 Acute posthemorrhagic anemia: Secondary | ICD-10-CM | POA: Diagnosis present

## 2022-08-07 DIAGNOSIS — K5730 Diverticulosis of large intestine without perforation or abscess without bleeding: Secondary | ICD-10-CM | POA: Diagnosis present

## 2022-08-07 DIAGNOSIS — Z833 Family history of diabetes mellitus: Secondary | ICD-10-CM

## 2022-08-07 DIAGNOSIS — R531 Weakness: Secondary | ICD-10-CM

## 2022-08-07 DIAGNOSIS — Z8249 Family history of ischemic heart disease and other diseases of the circulatory system: Secondary | ICD-10-CM

## 2022-08-07 DIAGNOSIS — J449 Chronic obstructive pulmonary disease, unspecified: Secondary | ICD-10-CM

## 2022-08-07 DIAGNOSIS — I959 Hypotension, unspecified: Secondary | ICD-10-CM | POA: Diagnosis present

## 2022-08-07 LAB — CBC
HCT: 18.7 % — ABNORMAL LOW (ref 36.0–46.0)
HCT: 20.3 % — ABNORMAL LOW (ref 36.0–46.0)
Hemoglobin: 5.1 g/dL — ABNORMAL LOW (ref 12.0–15.0)
Hemoglobin: 5.9 g/dL — ABNORMAL LOW (ref 12.0–15.0)
MCH: 20.2 pg — ABNORMAL LOW (ref 26.0–34.0)
MCH: 21.9 pg — ABNORMAL LOW (ref 26.0–34.0)
MCHC: 27.3 g/dL — ABNORMAL LOW (ref 30.0–36.0)
MCHC: 29.1 g/dL — ABNORMAL LOW (ref 30.0–36.0)
MCV: 73.9 fL — ABNORMAL LOW (ref 80.0–100.0)
MCV: 75.2 fL — ABNORMAL LOW (ref 80.0–100.0)
Platelets: 299 10*3/uL (ref 150–400)
Platelets: 266 10*3/uL (ref 150–400)
RBC: 2.53 MIL/uL — ABNORMAL LOW (ref 3.87–5.11)
RBC: 2.7 MIL/uL — ABNORMAL LOW (ref 3.87–5.11)
RDW: 19.5 % — ABNORMAL HIGH (ref 11.5–15.5)
RDW: 19.3 % — ABNORMAL HIGH (ref 11.5–15.5)
WBC: 3.9 10*3/uL — ABNORMAL LOW (ref 4.0–10.5)
WBC: 3.8 10*3/uL — ABNORMAL LOW (ref 4.0–10.5)
nRBC: 0 % (ref 0.0–0.2)
nRBC: 0 % (ref 0.0–0.2)

## 2022-08-07 LAB — IRON AND TIBC
Iron: 12 ug/dL — ABNORMAL LOW (ref 28–170)
Saturation Ratios: 3 % — ABNORMAL LOW (ref 10.4–31.8)
TIBC: 480 ug/dL — ABNORMAL HIGH (ref 250–450)
UIBC: 468 ug/dL

## 2022-08-07 LAB — BASIC METABOLIC PANEL
Anion gap: 11 (ref 5–15)
BUN: 21 mg/dL (ref 8–23)
CO2: 24 mmol/L (ref 22–32)
Calcium: 9.2 mg/dL (ref 8.9–10.3)
Chloride: 103 mmol/L (ref 98–111)
Creatinine, Ser: 0.88 mg/dL (ref 0.44–1.00)
GFR, Estimated: >60 mL/min (ref >60)
Glucose, Bld: 115 mg/dL — ABNORMAL HIGH (ref 70–99)
Potassium: 3.4 mmol/L — ABNORMAL LOW (ref 3.5–5.1)
Sodium: 138 mmol/L (ref 135–145)

## 2022-08-07 MED ORDER — FUROSEMIDE 20 MG PO TABS
20.0000 mg | ORAL_TABLET | Freq: Every day | ORAL | Status: DC
  Administered 2022-08-08 – 2022-08-10 (×3): 20 mg via ORAL
  Filled 2022-08-07 (×3): qty 1

## 2022-08-07 MED ORDER — ONDANSETRON HCL 4 MG/2ML IJ SOLN
4.0000 mg | Freq: Four times a day (QID) | INTRAMUSCULAR | Status: DC | PRN
  Administered 2022-08-09: 4 mg via INTRAVENOUS
  Filled 2022-08-07: qty 2

## 2022-08-07 MED ORDER — POLYSACCHARIDE IRON COMPLEX 150 MG PO CAPS
150.0000 mg | ORAL_CAPSULE | Freq: Every day | ORAL | Status: DC
  Administered 2022-08-08 – 2022-08-10 (×3): 150 mg via ORAL
  Filled 2022-08-07 (×3): qty 1

## 2022-08-07 MED ORDER — PANTOPRAZOLE SODIUM 40 MG PO TBEC
40.0000 mg | DELAYED_RELEASE_TABLET | Freq: Two times a day (BID) | ORAL | Status: DC
  Administered 2022-08-07 – 2022-08-10 (×6): 40 mg via ORAL
  Filled 2022-08-07 (×6): qty 1

## 2022-08-07 MED ORDER — DIFLUPREDNATE 0.05 % OP EMUL: 1.0000 [drp] | Freq: Two times a day (BID) | OPHTHALMIC | Status: DC

## 2022-08-07 MED ORDER — ROSUVASTATIN CALCIUM 10 MG PO TABS
10.0000 mg | ORAL_TABLET | Freq: Every day | ORAL | Status: DC
  Administered 2022-08-08 – 2022-08-10 (×3): 10 mg via ORAL
  Filled 2022-08-07 (×3): qty 1

## 2022-08-07 MED ORDER — ONDANSETRON HCL 4 MG PO TABS: 4.0000 mg | ORAL_TABLET | Freq: Four times a day (QID) | ORAL | Status: DC | PRN

## 2022-08-07 MED ORDER — ACETAMINOPHEN 650 MG RE SUPP: 650.0000 mg | Freq: Four times a day (QID) | RECTAL | Status: DC | PRN

## 2022-08-07 MED ORDER — ACETAMINOPHEN 325 MG PO TABS
650.0000 mg | ORAL_TABLET | Freq: Four times a day (QID) | ORAL | Status: DC | PRN
  Administered 2022-08-08: 650 mg via ORAL
  Filled 2022-08-07: qty 2

## 2022-08-07 MED ORDER — FLUTICASONE FUROATE-VILANTEROL 100-25 MCG/ACT IN AEPB
1.0000 | INHALATION_SPRAY | Freq: Every day | RESPIRATORY_TRACT | Status: DC
  Administered 2022-08-08 – 2022-08-10 (×3): 1 via RESPIRATORY_TRACT
  Filled 2022-08-07: qty 28

## 2022-08-07 MED ORDER — ALBUTEROL SULFATE (2.5 MG/3ML) 0.083% IN NEBU: 2.5000 mg | INHALATION_SOLUTION | RESPIRATORY_TRACT | Status: DC | PRN

## 2022-08-07 MED ORDER — SODIUM CHLORIDE 0.9 % IV SOLN
10.0000 mL/h | Freq: Once | INTRAVENOUS | Status: AC
  Administered 2022-08-07: 10 mL/h via INTRAVENOUS

## 2022-08-07 NOTE — ED Notes (Signed)
Consent for blood transfusion obtained and witnessed by this RN

## 2022-08-07 NOTE — Assessment & Plan Note (Addendum)
Previously on midodrine due to hypotension in setting of acute blood loss anemia.  Blood pressure is currently within normal range.  Will monitor closely and restart midodrine if needed

## 2022-08-07 NOTE — Assessment & Plan Note (Addendum)
Patient agrees euvolemic on examination, however will monitor closely while receiving transfusions.  -Resume home Lasix tomorrow

## 2022-08-07 NOTE — ED Provider Notes (Signed)
[facility] Provider Note    Event Date/Time   First MD Initiated Contact with Patient 08/07/22 1348     (approximate)   History   low hgb   HPI  Courtney Mack is a 67 y.o. female here with generalized weakness.  The patient states that over the last week or so, she has had progressive worsening generalized weakness.  She has had some shortness of breath with exertion.  She has chronic anemia but states that she was taken off of her iron.  She is not sure why.  She states that she had routine lab work drawn at her PCP recently and was told to come into the ED for hemoglobin of 5.  Denies any active bleeding.  Denies any melena.  Hematochezia.     Physical Exam   Triage Vital Signs: ED Triage Vitals  Enc Vitals Group     BP 08/07/22 1152 (!) 135/53     Pulse Rate 08/07/22 1152 79     Resp 08/07/22 1152 18     Temp 08/07/22 1152 98 F (36.7 C)     Temp src --      SpO2 08/07/22 1152 100 %     Weight --      Height --      Head Circumference --      Peak Flow --      Pain Score 08/07/22 1151 0     Pain Loc --      Pain Edu? --      Excl. in [place]? --     Most recent vital signs: Vitals:   08/07/22 1430 08/07/22 1500  BP: (!) 129/55 (!) 123/56  Pulse: 87 86  Resp: 15 20  Temp:    SpO2: 100% 100%     General: Awake, no distress.  CV:  Good peripheral perfusion.  Regular rate and rhythm. Resp:  Normal effort.  Abd:  No distention.  No tenderness. Other:  Significant pallor noted.  Distal cap refill is intact.  Alert and oriented x 4.   ED Results / Procedures / Treatments   Labs (all labs ordered are listed, but only abnormal results are displayed) Labs Reviewed  CBC - Abnormal; Notable for the following components:      Result Value   WBC 3.9 (*)    RBC 2.53 (*)    Hemoglobin 5.1 (*)    HCT 18.7 (*)    MCV 73.9 (*)    MCH 20.2 (*)    MCHC 27.3 (*)    RDW 19.5 (*)    All other components within normal limits  BASIC  METABOLIC PANEL - Abnormal; Notable for the following components:   Potassium 3.4 (*)    Glucose, Bld 115 (*)    All other components within normal limits  TYPE AND SCREEN  PREPARE RBC (CROSSMATCH)     EKG Normal sinus rhythm, ventricular rate 84.  PR 150, QRS 70, QTc 451.  No acute ST elevations or depressions.  No ischemia or infarct.   RADIOLOGY Chest x-ray: No active disease   I also independently reviewed and agree with radiologist interpretations.   PROCEDURES:  Critical Care performed: Yes, see critical care procedure note(s)  .Critical Care  Performed by: Duffy Bruce, MD Authorized by: Duffy Bruce, MD   Critical care provider statement:    Critical care time (minutes):  30   Critical care time was exclusive of:  Separately billable procedures and treating other patients  Critical care was necessary to treat or prevent imminent or life-threatening deterioration of the following conditions:  Circulatory failure, cardiac failure and respiratory failure   Critical care was time spent personally by me on the following activities:  Development of treatment plan with patient or surrogate, discussions with consultants, evaluation of patient's response to treatment, examination of patient, ordering and review of laboratory studies, ordering and review of radiographic studies, ordering and performing treatments and interventions, pulse oximetry, re-evaluation of patient's condition and review of old charts     MEDICATIONS ORDERED IN ED: Medications  albuterol (PROVENTIL) (2.5 MG/3ML) 0.083% nebulizer solution 2.5 mg (has no administration in time range)  0.9 %  sodium chloride infusion (has no administration in time range)     IMPRESSION / MDM / [place] / ED COURSE  I reviewed the triage vital signs and the nursing notes.                              Differential diagnosis includes, but is not limited to, acute on chronic anemia from UGIB, LGIB,  hemolysis, anemia of chronic disease, ACS, CHF, arrhythmia, deconditioning  Patient's presentation is most consistent with acute presentation with potential threat to life or bodily function.  The patient is on the cardiac monitor to evaluate for evidence of arrhythmia and/or significant heart rate changes.   67 year old female with history of chronic anemia due to gastric antral ectasia here with generalized weakness.  Hemoglobin 5.1, significantly below her baseline.  She denies any melena or signs of active bleeding.  She has no chest pain.  She has shortness of breath with exertion, but no evidence to suggest acute ischemia.  Renal function is at baseline.  Lab work is otherwise overall reassuring.  Will admit to medicine for transfusion.  Chest x-ray clear.   FINAL CLINICAL IMPRESSION(S) / ED DIAGNOSES   Final diagnoses:  Acute on chronic anemia     Rx / DC Orders   ED Discharge Orders     None        Note:  This document was prepared using Dragon voice recognition software and may include unintentional dictation errors.   Duffy Bruce, MD 08/07/22 [phone]

## 2022-08-07 NOTE — Assessment & Plan Note (Signed)
No wheezing on examination.  -Continue home bronchodilators

## 2022-08-07 NOTE — ED Notes (Signed)
Pt's breif has been changed at this time. Pt was whipped down with warm clothes and sacrum patch was applied. Barrier cream was applied to the genial area as well. Pt has stage 1 breakdown in the sacrum area.

## 2022-08-07 NOTE — Telephone Encounter (Signed)
Patient called reporting that Dr Clide Deutscher from [facility] called her to let her know that her Hgb drawn yesterday is 5.1 and that she should call our office to see if we can see her and if not she should go to the ER . I told her with her hgb that low I was not sure our staff would be comfortable with her coming to our office or if we could even accommodate her today for a transfusion. She said she would go to the ER.

## 2022-08-07 NOTE — Assessment & Plan Note (Signed)
Patient has a long-term history of GAVE with most recent endoscopy in October 2023 where she underwent argon plasma therapy.  Presenting today with acute exacerbation of his acute anemia likely due to GAVE.  Interestingly, she is not experiencing any melena or hematochezia, likely indicating a slow bleed.  Will transfuse today with goals of hemoglobin >7.  If unable to maintain hemoglobin above goal, will plan to consult GI at that time for repeat endoscopy.  - Monitor CBC as noted above - Clear liquid diet for now

## 2022-08-07 NOTE — ED Notes (Signed)
Per Lab pt has + antibody screen

## 2022-08-07 NOTE — ED Notes (Signed)
H/H 5.1/ 18.7

## 2022-08-07 NOTE — H&P (Addendum)
History and Physical    Patient: Courtney Mack DOA: 08/07/2022 DOS: the patient was seen and examined on 08/07/2022 PCP: [facility] [place]  Patient coming from: Home  Chief Complaint:  Chief Complaint  Patient presents with   low hgb   HPI: Courtney Mack is a 67 y.o. female with medical history significant of IDA in the setting of gastric antral vascular ectasia, hypotension, HFpEF, hyperlipidemia, left nephrostomy tube, who presents to the ED with complaints of low hemoglobin.  Courtney Mack states that for the past couple days, she has been experiencing increasing dyspnea on exertion in addition to fatigue.  She denies any shortness of breath at rest.  She denies any melena, hematochezia, or hematemesis.  She states she had a follow-up visit with her PCP yesterday where she had blood drawn.  They called her this morning and told her her blood counts were 5.1 and that she needed to go to the ED.  ED course: On arrival to the ED, patient was normotensive at 135/53 with heart rate of 79.  She was saturating at 100% on room air.  Initial workup remarkable for hemoglobin of 5.1 with MCV of 73, WBC of 3.9, potassium 3.4, creatinine of 0.88 and BUN of 21.  GFR over 60.  Chest x-ray was obtained that did not show any acute cardiopulmonary disease.  2 units of packed RBCs ordered.  TRH contacted for admission.  Review of Systems: As mentioned in the history of present illness. All other systems reviewed and are negative.  Past Medical History:  Diagnosis Date   Acute bilateral deep vein thrombosis (DVT) of femoral veins ([place]) 01/02/2021   Acute massive pulmonary embolism ([place]) 08/25/2020   Last Assessment & Plan:  Formatting of this note might be different from the original. Post vena caval filter and on xarelto and O2   Anemia    ARF (acute respiratory failure) ([place])    Cellulitis of left lower leg 06/20/2021   CHF (congestive heart  failure) ([place])    COVID-19    Diabetes mellitus without complication ([place])    DVT (deep venous thrombosis) ([place]) 09/06/2020   Endometrial cancer (HCC)    GAVE (gastric antral vascular ectasia)    GERD (gastroesophageal reflux disease)    High cholesterol    Hx of blood clots    Hyperlipidemia    Hypertension    IDA (iron deficiency anemia) 09/21/2020   Obesity    Pneumonia 06/11/2022   recovered   Pressure injury of skin 06/21/2021   Pulmonary embolism ([place])    Sepsis ([place])    Thrombocytopenia ([place])    Urinary tract infection 09/13/2021   Past Surgical History:  Procedure Laterality Date   CATARACT EXTRACTION W/PHACO Right 06/04/2022   Procedure: CATARACT EXTRACTION PHACO AND INTRAOCULAR LENS PLACEMENT (IOC) COMPLICATED RIGHT  27.51  02:17.4;  Surgeon: Birder Robson, MD;  Location: [facility];  Service: Ophthalmology;  Laterality: Right;   CATARACT EXTRACTION W/PHACO Left 07/09/2022   Procedure: CATARACT EXTRACTION PHACO AND INTRAOCULAR LENS PLACEMENT (IOC);  Surgeon: Birder Robson, MD;  Location: [facility];  Service: Ophthalmology;  Laterality: Left;  22.75 1:40.3   COLONOSCOPY N/A 06/21/2021   Procedure: COLONOSCOPY;  Surgeon: Toledo, Benay Pike, MD;  Location: [facility];  Service: Gastroenterology;  Laterality: N/A;   CYSTOSCOPY W/ URETERAL STENT PLACEMENT Left 09/11/2021   Procedure: CYSTOSCOPY WITH RETROGRADE PYELOGRAM/URETERAL STENT PLACEMENT;  Surgeon: Abbie Sons, MD;  Location: [facility];  Service:  Urology;  Laterality: Left;   CYSTOSCOPY W/ URETERAL STENT PLACEMENT Left 11/17/2021   Procedure: CYSTOSCOPY WITH STENT REPLACEMENT;  Surgeon: Irine Seal, MD;  Location: [facility];  Service: Urology;  Laterality: Left;   CYSTOSCOPY WITH FULGERATION  11/17/2021   Procedure: CYSTOSCOPY WITH FULGERATION;  Surgeon: Irine Seal, MD;  Location: [facility];  Service: Urology;;   CYSTOSCOPY WITH STENT PLACEMENT Left 10/17/2021   Procedure: CYSTOSCOPY WITH  STENT PLACEMENT;  Surgeon: Abbie Sons, MD;  Location: [facility];  Service: Urology;  Laterality: Left;   CYSTOSCOPY WITH URETEROSCOPY, STONE BASKETRY AND STENT PLACEMENT Left 10/16/2021   Procedure: CYSTOSCOPY WITH URETEROSCOPY  AND STENT REMOVAL;  Surgeon: Abbie Sons, MD;  Location: [facility];  Service: Urology;  Laterality: Left;   ENTEROSCOPY N/A 08/17/2021   Procedure: ENTEROSCOPY;  Surgeon: Lin Landsman, MD;  Location: [facility] END[facility]COPY;  Service: Gastroenterology;  Laterality: N/A;   ESOPHAGOGASTRODUODENOSCOPY N/A 06/21/2021   Procedure: ESOPHAGOGASTRODUODENOSCOPY (EGD);  Surgeon: Toledo, Benay Pike, MD;  Location: [facility];  Service: Gastroenterology;  Laterality: N/A;   ESOPHAGOGASTRODUODENOSCOPY (EGD) WITH PROPOFOL N/A 08/17/2021   Procedure: ESOPHAGOGASTRODUODENOSCOPY (EGD) WITH PROPOFOL;  Surgeon: Lin Landsman, MD;  Location: [facility] ENDOSCOPY;  Service: Gastroenterology;  Laterality: N/A;   ESOPHAGOGASTRODUODENOSCOPY (EGD) WITH PROPOFOL N/A 10/14/2021   Procedure: ESOPHAGOGASTRODUODENOSCOPY (EGD) WITH PROPOFOL;  Surgeon: [name], MD;  Location: [facility]OSCOPY;  Service: Endoscopy;  Laterality: N/A;   ESOPHAGOGASTRODUODENOSCOPY (EGD) WITH PROPOFOL N/A 02/01/2022   Procedure: ESOPHAGOGASTRODUODENOSCOPY (EGD) WITH PROPOFOL;  Surgeon: Lin Landsman, MD;  Location: [facility] ENDOSCOPY;  Service: Gastroenterology;  Laterality: N/A;   ESOPHAGOGASTRODUODENOSCOPY (EGD) WITH PROPOFOL N/A 03/15/2022   Procedure: ESOPHAGOGASTRODUODENOSCOPY (EGD) WITH PROPOFOL;  Surgeon: Jonathon Bellows, MD;  Location: [facility] ENDOSCOPY;  Service: Gastroenterology;  Laterality: N/A;   ESOPHAGOGASTRODUODENOSCOPY (EGD) WITH PROPOFOL N/A 04/18/2022   Procedure: ESOPHAGOGASTRODUODENOSCOPY (EGD) WITH PROPOFOL;  Surgeon: Lin Landsman, MD;  Location: [facility] ENDOSCOPY;  Service: Gastroenterology;  Laterality: N/A;   GIVENS CAPSULE STUDY N/A 06/22/2021   Procedure: GIVENS CAPSULE STUDY;  Surgeon: Toledo,  Benay Pike, MD;  Location: [facility] ENDOSCOPY;  Service: Gastroenterology;  Laterality: N/A;   IR NEPHROSTOMY EXCHANGE LEFT  02/25/2022   IR NEPHROSTOMY EXCHANGE LEFT  03/27/2022   IR NEPHROSTOMY EXCHANGE LEFT  05/22/2022   IR NEPHROSTOMY EXCHANGE LEFT  07/24/2022   IR NEPHROSTOMY PLACEMENT LEFT  12/27/2021   IVC FILTER INSERTION N/A 08/18/2020   Procedure: IVC FILTER INSERTION;  Surgeon: Katha Cabal, MD;  Location: [place] CV LAB;  Service: Cardiovascular;  Laterality: N/A;   PERIPHERAL VASCULAR THROMBECTOMY Bilateral 01/03/2021   Procedure: PERIPHERAL VASCULAR THROMBECTOMY;  Surgeon: Algernon Huxley, MD;  Location: [place] CV LAB;  Service: Cardiovascular;  Laterality: Bilateral;   PULMONARY THROMBECTOMY N/A 08/18/2020   Procedure: PULMONARY THROMBECTOMY;  Surgeon: Katha Cabal, MD;  Location: [place] CV LAB;  Service: Cardiovascular;  Laterality: N/A;   TUBAL LIGATION     VISCERAL ANGIOGRAPHY N/A 07/18/2021   Procedure: VISCERAL ANGIOGRAPHY;  Surgeon: Algernon Huxley, MD;  Location: [place] CV LAB;  Service: Cardiovascular;  Laterality: N/A;   Social History:  reports that she has never smoked. She has never used smokeless tobacco. She reports that she does not currently use alcohol. She reports that she does not currently use drugs.  No Known Allergies  Family History  Problem Relation Age of Onset   Hypertension Mother    Diabetes Mother    Diabetes Father    Hypertension Father  High Cholesterol Father    Congestive Heart Failure Father    Breast cancer Cousin     Prior to Admission medications   Medication Sig Start Date End Date Taking? Authorizing Provider  acetaminophen (TYLENOL) 500 MG tablet Take 500 mg by mouth every 6 (six) hours as needed for mild pain or fever.    [provider]  Ascorbic Acid (VITAMIN C) 1000 MG tablet Take 1,000 mg by mouth daily.    [provider]  Difluprednate 0.05 % EMUL Place 1 drop into the right eye 2  (two) times daily. 05/31/22   [provider]  ferrous sulfate 325 (65 FE) MG tablet Take 1 tablet (325 mg total) by mouth 2 (two) times daily with a meal. 03/16/22   Lorella Nimrod, MD  fluticasone furoate-vilanterol (BREO ELLIPTA) 100-25 MCG/ACT AEPB Inhale 1 puff into the lungs daily.    [provider]  furosemide (LASIX) 20 MG tablet Take 1 tablet (20 mg total) by mouth daily. Home med. 04/20/22   Enzo Bi, MD  ketorolac (ACULAR) 0.5 % ophthalmic solution Place 1 drop into the right eye 4 (four) times daily. 05/29/22   [provider]  moxifloxacin (VIGAMOX) 0.5 % ophthalmic solution Place 1 drop into the right eye 3 (three) times daily. 05/29/22   [provider]  Multiple Vitamin (MULTIVITAMIN WITH MINERALS) TABS tablet Take 1 tablet by mouth daily. 09/12/20   [name], MD  pantoprazole (PROTONIX) 40 MG tablet Take 1 tablet (40 mg total) by mouth 2 (two) times daily. 03/01/22 08/01/23  Sidney Ace, MD  polyethylene glycol (MIRALAX / GLYCOLAX) 17 g packet Take 17 g by mouth daily as needed for mild constipation. 03/29/22   Lavina Hamman, MD  rosuvastatin (CRESTOR) 10 MG tablet Take 1 tablet (10 mg total) by mouth daily. Patient taking differently: Take 10 mg by mouth in the morning. 10/03/20   Earlie Server, MD    Physical Exam: Vitals:   08/07/22 1152 08/07/22 1415 08/07/22 1430 08/07/22 1500  BP: (!) 135/53 (!) 110/53 (!) 129/55 (!) 123/56  Pulse: 79 77 87 86  Resp: '18 17 15 20  '$ Temp: 98 F (36.7 C)     SpO2: 100% 100% 100% 100%   Physical Exam Vitals and nursing note reviewed.  Constitutional:      General: She is not in acute distress.    Appearance: She is overweight.  HENT:     Head: Normocephalic and atraumatic.     Mouth/Throat:     Mouth: Mucous membranes are moist.     Pharynx: Oropharynx is clear.  Cardiovascular:     Rate and Rhythm: Normal rate and regular rhythm.     Heart sounds: No murmur heard.    No gallop.   Pulmonary:     Effort: Pulmonary effort is normal. No respiratory distress.     Breath sounds: Normal breath sounds. No wheezing, rhonchi or rales.  Abdominal:     General: Bowel sounds are normal. There is no distension.     Palpations: Abdomen is soft.     Tenderness: There is abdominal tenderness (Tenderness to palpation in the left lower quadrant). There is no guarding.  Musculoskeletal:     Comments: Bilateral lower extremity edema consistent with history of lymphedema  Skin:    General: Skin is warm.     Coloration: Skin is pale.  Neurological:     General: No focal deficit present.     Mental Status: She is  alert and oriented to person, place, and time. Mental status is at baseline.  Psychiatric:        Mood and Affect: Mood normal.        Behavior: Behavior normal.        Thought Content: Thought content normal.        Judgment: Judgment normal.    Data Reviewed: CBC with WBC of 5.1, MCV of 73, WBC of 3.9, platelets of 299 BMP with sodium of 138, potassium 3.4, bicarb 24, glucose 115, BUN 21, creatinine 0.88 and GFR over 60 Glucose 115  EKG personally reviewed.  Sinus rhythm with no ST or T wave changes concerning for acute ischemia  DG Chest 2 View  Result Date: 08/07/2022 CLINICAL DATA:  Shortness of breath EXAM: CHEST - 2 VIEW COMPARISON:  Chest x-ray dated June 11, 2022 FINDINGS: The heart size and mediastinal contours are within normal limits. Both lungs are clear. The visualized skeletal structures are unremarkable. IMPRESSION: No active cardiopulmonary disease. Electronically Signed   By: Yetta Glassman M.D.   On: 08/07/2022 13:37    There are no new results to review at this time.  Assessment and Plan: Symptomatic anemia Patient is presenting with several days of dyspnea on exertion and fatigue in the setting of acute on chronic blood loss anemia secondary to GAVE.  Previously treated with weekly IV iron with last treatment in October 2023.  - 2 units of  packed RBCs ordered - Post-transfusion CBC - Transfuse for hemoglobin less than 7 or hemodynamic instability - Iron panel pending  Gastric antral vascular ectasia Patient has a long-term history of GAVE with most recent endoscopy in October 2023 where she underwent argon plasma therapy.  Presenting today with acute exacerbation of his acute anemia likely due to GAVE.  Interestingly, she is not experiencing any melena or hematochezia, likely indicating a slow bleed.  Will transfuse today with goals of hemoglobin >7.  If unable to maintain hemoglobin above goal, will plan to consult GI at that time for repeat endoscopy.  - Monitor CBC as noted above - Clear liquid diet for now  Chronic diastolic CHF (congestive heart failure) ([place]) Patient agrees euvolemic on examination, however will monitor closely while receiving transfusions.  -Resume home Lasix tomorrow  Hypotension Previously on midodrine due to hypotension in setting of acute blood loss anemia.  Blood pressure is currently within normal range.  Will monitor closely and restart midodrine if needed  COPD (chronic obstructive pulmonary disease) (HCC) No wheezing on examination.  -Continue home bronchodilators  Advance Care Planning:   Code Status: Full Code verified that patient  Consults: None  Family Communication: No family at bedside  Severity of Illness: The appropriate patient status for this patient is OBSERVATION. Observation status is judged to be reasonable and necessary in order to provide the required intensity of service to ensure the patient's safety. The patient's presenting symptoms, physical exam findings, and initial radiographic and laboratory data in the context of their medical condition is felt to place them at decreased risk for further clinical deterioration. Furthermore, it is anticipated that the patient will be medically stable for discharge from the hospital within 2 midnights of admission.    Author: Jose Persia, MD 08/07/2022 3:50 PM  For on call review [email or web address].

## 2022-08-07 NOTE — ED Triage Notes (Addendum)
Pt comes with c/o low hgb. Pt denies any bleeding. Pt states she has labs done and was called today that he Hgb was 5.1. Pt states she has had some sob.   Pt not on thinners. Pt is very pale looking.

## 2022-08-07 NOTE — Assessment & Plan Note (Signed)
Patient is presenting with several days of dyspnea on exertion and fatigue in the setting of acute on chronic blood loss anemia secondary to GAVE.  Previously treated with weekly IV iron with last treatment in October 2023.  - 2 units of packed RBCs ordered - Post-transfusion CBC - Transfuse for hemoglobin less than 7 or hemodynamic instability - Iron panel pending

## 2022-08-08 ENCOUNTER — Encounter: Payer: Self-pay | Admitting: Internal Medicine

## 2022-08-08 DIAGNOSIS — Z9841 Cataract extraction status, right eye: Secondary | ICD-10-CM | POA: Diagnosis not present

## 2022-08-08 DIAGNOSIS — E7800 Pure hypercholesterolemia, unspecified: Secondary | ICD-10-CM | POA: Diagnosis present

## 2022-08-08 DIAGNOSIS — J449 Chronic obstructive pulmonary disease, unspecified: Secondary | ICD-10-CM | POA: Diagnosis present

## 2022-08-08 DIAGNOSIS — Z8249 Family history of ischemic heart disease and other diseases of the circulatory system: Secondary | ICD-10-CM | POA: Diagnosis not present

## 2022-08-08 DIAGNOSIS — E119 Type 2 diabetes mellitus without complications: Secondary | ICD-10-CM | POA: Diagnosis present

## 2022-08-08 DIAGNOSIS — Z803 Family history of malignant neoplasm of breast: Secondary | ICD-10-CM | POA: Diagnosis not present

## 2022-08-08 DIAGNOSIS — I5032 Chronic diastolic (congestive) heart failure: Secondary | ICD-10-CM | POA: Diagnosis not present

## 2022-08-08 DIAGNOSIS — E876 Hypokalemia: Secondary | ICD-10-CM | POA: Diagnosis present

## 2022-08-08 DIAGNOSIS — Z7951 Long term (current) use of inhaled steroids: Secondary | ICD-10-CM | POA: Diagnosis not present

## 2022-08-08 DIAGNOSIS — E669 Obesity, unspecified: Secondary | ICD-10-CM | POA: Diagnosis present

## 2022-08-08 DIAGNOSIS — Z95828 Presence of other vascular implants and grafts: Secondary | ICD-10-CM | POA: Diagnosis not present

## 2022-08-08 DIAGNOSIS — I110 Hypertensive heart disease with heart failure: Secondary | ICD-10-CM | POA: Diagnosis present

## 2022-08-08 DIAGNOSIS — Z8542 Personal history of malignant neoplasm of other parts of uterus: Secondary | ICD-10-CM | POA: Diagnosis not present

## 2022-08-08 DIAGNOSIS — D649 Anemia, unspecified: Secondary | ICD-10-CM | POA: Diagnosis present

## 2022-08-08 DIAGNOSIS — D62 Acute posthemorrhagic anemia: Secondary | ICD-10-CM | POA: Diagnosis present

## 2022-08-08 DIAGNOSIS — K31811 Angiodysplasia of stomach and duodenum with bleeding: Secondary | ICD-10-CM | POA: Diagnosis present

## 2022-08-08 DIAGNOSIS — Z936 Other artificial openings of urinary tract status: Secondary | ICD-10-CM | POA: Diagnosis not present

## 2022-08-08 DIAGNOSIS — Z79899 Other long term (current) drug therapy: Secondary | ICD-10-CM | POA: Diagnosis not present

## 2022-08-08 DIAGNOSIS — K31819 Angiodysplasia of stomach and duodenum without bleeding: Secondary | ICD-10-CM | POA: Diagnosis not present

## 2022-08-08 DIAGNOSIS — Z9842 Cataract extraction status, left eye: Secondary | ICD-10-CM | POA: Diagnosis not present

## 2022-08-08 DIAGNOSIS — Z83438 Family history of other disorder of lipoprotein metabolism and other lipidemia: Secondary | ICD-10-CM | POA: Diagnosis not present

## 2022-08-08 DIAGNOSIS — Z86718 Personal history of other venous thrombosis and embolism: Secondary | ICD-10-CM | POA: Diagnosis not present

## 2022-08-08 DIAGNOSIS — Z961 Presence of intraocular lens: Secondary | ICD-10-CM | POA: Diagnosis present

## 2022-08-08 DIAGNOSIS — Z833 Family history of diabetes mellitus: Secondary | ICD-10-CM | POA: Diagnosis not present

## 2022-08-08 DIAGNOSIS — Z86711 Personal history of pulmonary embolism: Secondary | ICD-10-CM | POA: Diagnosis not present

## 2022-08-08 DIAGNOSIS — Z8616 Personal history of COVID-19: Secondary | ICD-10-CM | POA: Diagnosis not present

## 2022-08-08 LAB — BASIC METABOLIC PANEL
Anion gap: 6 (ref 5–15)
BUN: 16 mg/dL (ref 8–23)
CO2: 25 mmol/L (ref 22–32)
Calcium: 8.7 mg/dL — ABNORMAL LOW (ref 8.9–10.3)
Chloride: 108 mmol/L (ref 98–111)
Creatinine, Ser: 0.72 mg/dL (ref 0.44–1.00)
GFR, Estimated: >60 mL/min (ref >60)
Glucose, Bld: 94 mg/dL (ref 70–99)
Potassium: 3.6 mmol/L (ref 3.5–5.1)
Sodium: 139 mmol/L (ref 135–145)

## 2022-08-08 LAB — CBC
HCT: 21.7 % — ABNORMAL LOW (ref 36.0–46.0)
Hemoglobin: 6.3 g/dL — ABNORMAL LOW (ref 12.0–15.0)
MCH: 22.1 pg — ABNORMAL LOW (ref 26.0–34.0)
MCHC: 29 g/dL — ABNORMAL LOW (ref 30.0–36.0)
MCV: 76.1 fL — ABNORMAL LOW (ref 80.0–100.0)
Platelets: 251 10*3/uL (ref 150–400)
RBC: 2.85 MIL/uL — ABNORMAL LOW (ref 3.87–5.11)
RDW: 18.7 % — ABNORMAL HIGH (ref 11.5–15.5)
WBC: 3.8 10*3/uL — ABNORMAL LOW (ref 4.0–10.5)
nRBC: 0 % (ref 0.0–0.2)

## 2022-08-08 LAB — PREPARE RBC (CROSSMATCH)

## 2022-08-08 LAB — HEMOGLOBIN AND HEMATOCRIT, BLOOD
HCT: 26.9 % — ABNORMAL LOW (ref 36.0–46.0)
Hemoglobin: 8.2 g/dL — ABNORMAL LOW (ref 12.0–15.0)

## 2022-08-08 MED ORDER — SODIUM CHLORIDE 0.9 % IV SOLN
200.0000 mg | Freq: Once | INTRAVENOUS | Status: AC
  Administered 2022-08-08: 200 mg via INTRAVENOUS
  Filled 2022-08-08: qty 200

## 2022-08-08 MED ORDER — SODIUM CHLORIDE 0.9% IV SOLUTION: Freq: Once | INTRAVENOUS | Status: DC

## 2022-08-08 NOTE — Progress Notes (Signed)
Progress Note    Courtney Mack  LFY:[id number] DOB: 29-Jun-1956  DOA: 08/07/2022 PCP: [facility] [place]      Brief Narrative:    Medical records reviewed and are as summarized below:  Courtney Mack is a 67 y.o. female with medical history significant for chronic iron deficiency anemia in the setting of gastric antral vascular ectasia (GAVE), hypertension, chronic diastolic CHF, hyperlipidemia, left nephrostomy tube, history of pulmonary embolism (June 2022) and DVT (Feb 2022), s/p IVC filter in February 2022, taken off Xarelto in October 2022 because of recurrent GI bleed.  She presented to the hospital because of low hemoglobin of 5.1.  She had lab work done in the outpatient setting and was called to come to the emergency department because of low hemoglobin.  She complains of worsening dyspnea on exertion, fatigue.  No abdominal pain, hematemesis, hemoptysis or hematochezia.   She was admitted to the hospital for severe symptomatic anemia.     Assessment/Plan:   Active Problems:   Symptomatic anemia   Gastric antral vascular ectasia   Chronic diastolic CHF (congestive heart failure) (HCC)   Hypotension   COPD (chronic obstructive pulmonary disease) (HCC)    Body mass index is 35.84 kg/m.  (Obesity)  Severe anemia, symptomatic: Hemoglobin was 5.1 on admission.  Hemoglobin only improved to 6.3 s/p transfusion with 2 units of PRBCs.  Transfuse another unit of packed red blood cells.  Give 1 dose of IV iron sucrose.   History of GAVE: Continue Protonix.  Consulted Dr. Vicente Males, gastroenterologist, for further evaluation.  Plan for EGD tomorrow.   Hypokalemia: Improved   Chronic diastolic CHF: Compensated.  Continue Lasix   COPD: Compensated.  Continue bronchodilators   History of DVT and pulmonary embolism no longer on Xarelto.  She has an IVC filter in place.   History of hypotension: BP is optimal.  She was on midodrine prior to  admission.  Monitor BP closely.   Diet Order             Diet clear liquid Room service appropriate? Yes; Fluid consistency: Thin  Diet effective now                            Consultants: Gastroenterologist  Procedures: None    Medications:    sodium chloride   Intravenous Once   fluticasone furoate-vilanterol  1 puff Inhalation Daily   furosemide  20 mg Oral Daily   iron polysaccharides  150 mg Oral Daily   pantoprazole  40 mg Oral BID   rosuvastatin  10 mg Oral Daily   Continuous Infusions:   Anti-infectives (From admission, onward)    None              Family Communication/Anticipated D/C date and plan/Code Status   DVT prophylaxis: SCDs Start: 08/07/22 1537     Code Status: Full Code  Family Communication: None Disposition Plan: Plan to discharge home in 2 days   Status is: Observation The patient will require care spanning > 2 midnights and should be moved to inpatient because: She needs EGD for further evaluation of anemia       Subjective:   Interval events noted.  Breathing is a little better after blood transfusion..  No dizziness, chest pain, vomiting, bloody stools.  Objective:    Vitals:   08/08/22 0007 08/08/22 0026 08/08/22 0548 08/08/22 0753  BP: (!) 120/52 (!) 108/46 (!) 116/49 Marland Kitchen)  124/51  Pulse: 81 75 76 78  Resp: '17 20 18 20  '$ Temp: 98.1 F (36.7 C) 98.5 F (36.9 C) 98.3 F (36.8 C) 97.8 F (36.6 C)  TempSrc: Oral     SpO2: 99% 100% 98% 98%  Weight:      Height:       No data found.   Intake/Output Summary (Last 24 hours) at 08/08/2022 5284 Last data filed at 08/08/2022 0000 Gross per 24 hour  Intake 602 ml  Output 200 ml  Net 402 ml   Filed Weights   08/07/22 2045  Weight: 80.5 kg    Exam:   GEN: NAD SKIN: Warm and dry EYES: No pallor or icterus ENT: MMM CV: RRR PULM: CTA B ABD: soft, ND, NT, +BS CNS: AAO x 3, non focal EXT: Chronic b/l leg swelling GU: Left nephrostomy tube  draining amber urine     Data Reviewed:   I have personally reviewed following labs and imaging studies:  Labs: Labs show the following:   Basic Metabolic Panel: Recent Labs  Lab 08/07/22 1157 08/08/22 0357  NA 138 139  K 3.4* 3.6  CL 103 108  CO2 24 25  GLUCOSE 115* 94  BUN 21 16  CREATININE 0.88 0.72  CALCIUM 9.2 8.7*   GFR Estimated Creatinine Clearance: 62.6 mL/min (by C-G formula based on SCr of 0.72 mg/dL). Liver Function Tests: No results for input(s): "AST", "ALT", "ALKPHOS", "BILITOT", "PROT", "ALBUMIN" in the last 168 hours. No results for input(s): "LIPASE", "AMYLASE" in the last 168 hours. No results for input(s): "AMMONIA" in the last 168 hours. Coagulation profile No results for input(s): "INR", "PROTIME" in the last 168 hours.  CBC: Recent Labs  Lab 08/07/22 1157 08/07/22 2107 08/08/22 0357  WBC 3.9* 3.8* 3.8*  HGB 5.1* 5.9* 6.3*  HCT 18.7* 20.3* 21.7*  MCV 73.9* 75.2* 76.1*  PLT 299 266 251   Cardiac Enzymes: No results for input(s): "CKTOTAL", "CKMB", "CKMBINDEX", "TROPONINI" in the last 168 hours. BNP (last 3 results) No results for input(s): "PROBNP" in the last 8760 hours. CBG: No results for input(s): "GLUCAP" in the last 168 hours. D-Dimer: No results for input(s): "DDIMER" in the last 72 hours. Hgb A1c: No results for input(s): "HGBA1C" in the last 72 hours. Lipid Profile: No results for input(s): "CHOL", "HDL", "LDLCALC", "TRIG", "CHOLHDL", "LDLDIRECT" in the last 72 hours. Thyroid function studies: No results for input(s): "TSH", "T4TOTAL", "T3FREE", "THYROIDAB" in the last 72 hours.  Invalid input(s): "FREET3" Anemia work up: Recent Labs    08/07/22 1157  TIBC 480*  IRON 12*   Sepsis Labs: Recent Labs  Lab 08/07/22 1157 08/07/22 2107 08/08/22 0357  WBC 3.9* 3.8* 3.8*    Microbiology No results found for this or any previous visit (from the past 240 hour(s)).  Procedures and diagnostic studies:  DG Chest 2  View  Result Date: 08/07/2022 CLINICAL DATA:  Shortness of breath EXAM: CHEST - 2 VIEW COMPARISON:  Chest x-ray dated June 11, 2022 FINDINGS: The heart size and mediastinal contours are within normal limits. Both lungs are clear. The visualized skeletal structures are unremarkable. IMPRESSION: No active cardiopulmonary disease. Electronically Signed   By: Yetta Glassman M.D.   On: 08/07/2022 13:37               LOS: 0 days   Sharrieff Spratlin  [facility]   Pager on [email or web address]. If 7PM-7AM, please contact night-coverage at [email or web address]     08/08/2022, 9:29 AM

## 2022-08-08 NOTE — Consult Note (Signed)
Courtney Mack , MD [address], [place], [place], [place], [address] [address], [place], [place], [place], [address] Phone: [phone]  Fax: [phone]  Consultation  Referring Provider:  Dr Mal Misty  Primary Care Physician:  [facility] Primary Gastroenterologist:  Dr. Alice Reichert          Reason for Consultation:     Anemia   Date of Admission:  08/07/2022 Date of Consultation:  08/08/2022         HPI:   Courtney Mack is a 67 y.o. female who has had multiple hospitalization for acute blood loss anemia iron deficiency anemia who had been on Xarelto for PE DVT, history of endometrial cancer, gave.  Prior hospitalizations for anemia has required APC to treat the GAVE IV iron from hematology in July 2023 hemoglobin of 4.7 g admitted again in 03/13/2022 with melena, had recommended cryotherapy previously in addition to outpatient capsule study of small bowel EGD on 03/15/2022 showed no abnormalities she had upper endoscopy October 2023 by Dr. Marius Ditch ,  severe [place] treated with APC.  Subsequently had no outpatient follow-up.  Colonoscopy was in 2022  Patient was noted to have diverticulosis of the colon. The patient presented to the hospital on 08/07/2022 with low hemoglobin after her blood counts were checked by her primary care doctor and was found to have a hemoglobin of 5.1 g.  She was symptomatic with dyspnea.  Iron low at 12 this morning hemoglobin 6.3 g.She denies any nose bleeds, black stool or red stool, enies any abdominalpain or hematemesis. No NSAID use.  Past Medical History:  Diagnosis Date   Acute bilateral deep vein thrombosis (DVT) of femoral veins ([place]) 01/02/2021   Acute massive pulmonary embolism ([facility]) 08/25/2020   Last Assessment & Plan:  Formatting of this note might be different from the original. Post vena caval filter and on xarelto and O2   Anemia    ARF (acute respiratory failure) ([place] [facility])    Cellulitis of left lower leg 06/20/2021   CHF  (congestive heart failure) ([facility] [facility])    COVID-19    Diabetes mellitus without complication ([facility])    DVT (deep venous thrombosis) ([facility]) 09/06/2020   Endometrial cancer (HCC)    GAVE (gastric antral vascular ectasia)    GERD (gastroesophageal reflux disease)    High cholesterol    Hx of blood clots    Hyperlipidemia    Hypertension    IDA (iron deficiency anemia) 09/21/2020   Obesity    Pneumonia 06/11/2022   recovered   Pressure injury of skin 06/21/2021   Pulmonary embolism ([facility])    Sepsis ([place])    Thrombocytopenia ([place])    Urinary tract infection 09/13/2021    Past Surgical History:  Procedure Laterality Date   CATARACT EXTRACTION W/PHACO Right 06/04/2022   Procedure: CATARACT EXTRACTION PHACO AND INTRAOCULAR LENS PLACEMENT (IOC) COMPLICATED RIGHT  89.37  02:17.4;  Surgeon: Birder Robson, MD;  Location: [facility];  Service: Ophthalmology;  Laterality: Right;   CATARACT EXTRACTION W/PHACO Left 07/09/2022   Procedure: CATARACT EXTRACTION PHACO AND INTRAOCULAR LENS PLACEMENT (IOC);  Surgeon: Birder Robson, MD;  Location: [facility];  Service: Ophthalmology;  Laterality: Left;  22.75 1:40.3   COLONOSCOPY N/A 06/21/2021   Procedure: COLONOSCOPY;  Surgeon: Toledo, Benay Pike, MD;  Location: [facility];  Service: Gastroenterology;  Laterality: N/A;   CYSTOSCOPY W/ URETERAL STENT PLACEMENT Left 09/11/2021   Procedure: CYSTOSCOPY WITH RETROGRADE PYELOGRAM/URETERAL STENT PLACEMENT;  Surgeon: John Giovanni  C, MD;  Location: [facility];  Service: Urology;  Laterality: Left;   CYSTOSCOPY W/ URETERAL STENT PLACEMENT Left 11/17/2021   Procedure: CYSTOSCOPY WITH STENT REPLACEMENT;  Surgeon: Irine Seal, MD;  Location: [facility];  Service: Urology;  Laterality: Left;   CYSTOSCOPY WITH FULGERATION  11/17/2021   Procedure: CYSTOSCOPY WITH FULGERATION;  Surgeon: Irine Seal, MD;  Location: [facility];  Service: Urology;;   CYSTOSCOPY WITH STENT PLACEMENT Left 10/17/2021   Procedure:  CYSTOSCOPY WITH STENT PLACEMENT;  Surgeon: Abbie Sons, MD;  Location: [facility];  Service: Urology;  Laterality: Left;   CYSTOSCOPY WITH URETEROSCOPY, STONE BASKETRY AND STENT PLACEMENT Left 10/16/2021   Procedure: CYSTOSCOPY WITH URETEROSCOPY  AND STENT REMOVAL;  Surgeon: Abbie Sons, MD;  Location: [facility];  Service: Urology;  Laterality: Left;   ENTEROSCOPY N/A 08/17/2021   Procedure: ENTEROSCOPY;  Surgeon: Lin Landsman, MD;  Location[facility];  Service: Gastroenterology;  Laterality: N/A;   ESOPHAGOGASTRODUODENOSCOPY N/A 06/21/2021   Procedure: ESOPHAGOGASTRODUODENOSCOPY (EGD);  Surgeon: Toledo, Benay Pike, MD;  Location: [facility];  Service: Gastroenterology;  Laterality: N/A;   ESOPHAGOGASTRODUODENOSCOPY (EGD) WITH PROPOFOL N/A 08/17/2021   Procedure: ESOPHAGOGASTRODUODENOSCOPY (EGD) WITH PROPOFOL;  Surgeon: Lin Landsman, MD;  Location: [facility]COPY;  Service: Gastroenterology;  Laterality: N/A;   ESOPHAGOGASTRODUODENOSCOPY (EGD) WITH PROPOFOL N/A 10/14/2021   Procedure: ESOPHAGOGASTRODUODENOSCOPY (EGD) WITH PROPOFOL;  Surgeon: [name], MD;  Location: [facility];  Service: Endoscopy;  Laterality: N/A;   ESOPHAGOGASTRODUODENOSCOPY (EGD) WITH PROPOFOL N/A 02/01/2022   Procedure: ESOPHAGOGASTRODUODENOSCOPY (EGD) WITH PROPOFOL;  Surgeon: Lin Landsman, MD;  Location: [facility] ENDOSCOPY;  Service: Gastroenterology;  Laterality: N/A;   ESOPHAGOGASTRODUODENOSCOPY (EGD) WITH PROPOFOL N/A 03/15/2022   Procedure: ESOPHAGOGASTRODUODENOSCOPY (EGD) WITH PROPOFOL;  Surgeon: Courtney Bellows, MD;  Location: [facility] ENDOSCOPY;  Service: Gastroenterology;  Laterality: N/A;   ESOPHAGOGASTRODUODENOSCOPY (EGD) WITH PROPOFOL N/A 04/18/2022   Procedure: ESOPHAGOGASTRODUODENOSCOPY (EGD) WITH PROPOFOL;  Surgeon: Lin Landsman, MD;  Location: [facility] ENDOSCOPY;  Service: Gastroenterology;  Laterality: N/A;   GIVENS CAPSULE STUDY N/A 06/22/2021   Procedure: GIVENS CAPSULE STUDY;   Surgeon: Toledo, Benay Pike, MD;  Location: [facility];  Service: Gastroenterology;  Laterality: N/A;   IR NEPHROSTOMY EXCHANGE LEFT  02/25/2022   IR NEPHROSTOMY EXCHANGE LEFT  03/27/2022   IR NEPHROSTOMY EXCHANGE LEFT  05/22/2022   IR NEPHROSTOMY EXCHANGE LEFT  07/24/2022   IR NEPHROSTOMY PLACEMENT LEFT  12/27/2021   IVC FILTER INSERTION N/A 08/18/2020   Procedure: IVC FILTER INSERTION;  Surgeon: Katha Cabal, MD;  Location: [facility];  Service: Cardiovascular;  Laterality: N/A;   PERIPHERAL VASCULAR THROMBECTOMY Bilateral 01/03/2021   Procedure: PERIPHERAL VASCULAR THROMBECTOMY;  Surgeon: Algernon Huxley, MD;  Location: [place] [facility];  Service: Cardiovascular;  Laterality: Bilateral;   PULMONARY THROMBECTOMY N/A 08/18/2020   Procedure: PULMONARY THROMBECTOMY;  Surgeon: Katha Cabal, MD;  Location: [facility];  Service: Cardiovascular;  Laterality: N/A;   TUBAL LIGATION     VISCERAL ANGIOGRAPHY N/A 07/18/2021   Procedure: VISCERAL ANGIOGRAPHY;  Surgeon: Algernon Huxley, MD;  Location: [facility];  Service: Cardiovascular;  Laterality: N/A;    Prior to Admission medications   Medication Sig Start Date End Date Taking? Authorizing Provider  acetaminophen (TYLENOL) 500 MG tablet Take 500 mg by mouth every 6 (six) hours as needed for mild pain or fever.   Yes [provider]  Ascorbic Acid (VITAMIN C) 1000 MG tablet Take 1,000 mg by mouth daily.  Yes [provider]  Difluprednate 0.05 % EMUL Place 1 drop into the right eye 2 (two) times daily. 05/31/22  Yes [provider]  fluticasone furoate-vilanterol (BREO ELLIPTA) 100-25 MCG/ACT AEPB Inhale 1 puff into the lungs daily.   Yes [provider]  furosemide (LASIX) 20 MG tablet Take 1 tablet (20 mg total) by mouth daily. Home med. 04/20/22  Yes Enzo Bi, MD  ketorolac (ACULAR) 0.5 % ophthalmic solution Place 1 drop into the right eye daily. 05/29/22  Yes [provider]  midodrine (PROAMATINE) 10 MG tablet Take 10 mg by mouth 3 (three) times daily. 08/06/22  Yes [provider]  moxifloxacin (VIGAMOX) 0.5 % ophthalmic solution Place 1 drop into the right eye daily. 05/29/22  Yes [provider]  Multiple Vitamin (MULTIVITAMIN WITH MINERALS) TABS tablet Take 1 tablet by mouth daily. 09/12/20  Yes [name], MD  pantoprazole (PROTONIX) 40 MG tablet Take 1 tablet (40 mg total) by mouth 2 (two) times daily. 03/01/22 08/01/23 Yes Sreenath, Sudheer B, MD  polyethylene glycol (MIRALAX / GLYCOLAX) 17 g packet Take 17 g by mouth daily as needed for mild constipation. 03/29/22  Yes Lavina Hamman, MD  rosuvastatin (CRESTOR) 10 MG tablet Take 1 tablet (10 mg total) by mouth daily. Patient taking differently: Take 10 mg by mouth in the morning. 10/03/20  Yes Earlie Server, MD  ferrous sulfate 325 (65 FE) MG tablet Take 1 tablet (325 mg total) by mouth 2 (two) times daily with a meal. Patient not taking: Reported on 08/07/2022 03/16/22   Lorella Nimrod, MD    Family History  Problem Relation Age of Onset   Hypertension Mother    Diabetes Mother    Diabetes Father    Hypertension Father    High Cholesterol Father    Congestive Heart Failure Father    Breast cancer Cousin      Social History   Tobacco Use   Smoking status: Never   Smokeless tobacco: Never   Tobacco comments:    smoked 2-4 cigarettes for about 2-3 weeks   Vaping Use   Vaping Use: Never used  Substance Use Topics   Alcohol use: Not Currently   Drug use: Not Currently    Allergies as of 08/07/2022   (No Known Allergies)    Review of Systems:    All systems reviewed and negative except where noted in HPI.   Physical Exam:  Vital signs in last 24 hours: Temp:  [97.8 F (36.6 C)-98.5 F (36.9 C)] 97.8 F (36.6 C) (02/01 0753) Pulse Rate:  [72-92] 78 (02/01 0753) Resp:  [15-20] 20 (02/01 0753) BP: (103-135)/(44-77) 124/51 (02/01 0753) SpO2:  [98 %-100 %] 98  % (02/01 0753) Weight:  [80.5 kg] 80.5 kg (01/31 2045) Last BM Date : 08/06/22 General:   Pleasant, cooperative in NAD Head:  Normocephalic and atraumatic. Eyes:   No icterus.   Conjunctiva pink. PERRLA. Ears:  Normal auditory acuity. Neck:  Supple; no masses or thyroidomegaly Lungs: Respirations even and unlabored. Lungs clear to auscultation bilaterally.   No wheezes, crackles, or rhonchi.  Heart:  Regular rate and rhythm;  Without murmur, clicks, rubs or gallops Abdomen:  Soft, nondistended, nontender. Normal bowel sounds. No appreciable masses or hepatomegaly.  No rebound or guarding.  Neurologic:  Alert and oriented x3;  grossly normal neurologically. Psych:  Alert and cooperative. Normal affect.  LAB RESULTS: Recent Labs    08/07/22 1157 08/07/22 2107 08/08/22 0357  WBC 3.9*  3.8* 3.8*  HGB 5.1* 5.9* 6.3*  HCT 18.7* 20.3* 21.7*  PLT 299 266 251   BMET Recent Labs    08/07/22 1157 08/08/22 0357  NA 138 139  K 3.4* 3.6  CL 103 108  CO2 24 25  GLUCOSE 115* 94  BUN 21 16  CREATININE 0.88 0.72  CALCIUM 9.2 8.7*   LFT No results for input(s): "PROT", "ALBUMIN", "AST", "ALT", "ALKPHOS", "BILITOT", "BILIDIR", "IBILI" in the last 72 hours. PT/INR No results for input(s): "LABPROT", "INR" in the last 72 hours.  STUDIES: DG Chest 2 View  Result Date: 08/07/2022 CLINICAL DATA:  Shortness of breath EXAM: CHEST - 2 VIEW COMPARISON:  Chest x-ray dated June 11, 2022 FINDINGS: The heart size and mediastinal contours are within normal limits. Both lungs are clear. The visualized skeletal structures are unremarkable. IMPRESSION: No active cardiopulmonary disease. Electronically Signed   By: Yetta Glassman M.D.   On: 08/07/2022 13:37      Impression / Plan:   Courtney Mack is a 67 y.o. y/o female with history of multiple hospitalizations for chronic blood loss iron deficiency anemia probably related to GAVE has had multiple rounds of APC treatment and appears to have  failed.  Recently in October 2023 underwent APC of the stomach, did not follow-up with her primary gastroenterologist subsequently Dr. Alice Reichert.  Admitted to the hospital with incidental finding of a low hemoglobin of 5 g with very low iron after she presented to her doctor with dyspnea.  Very likely she has had chronic blood loss.  Plan 1.  IV iron transfuse to hemoglobin of at least over 7 g monitor CBC as needed 2.  Will perform EGD once hemoglobin is over 7 g we will plan for tomorrow 3.  She will need close follow-up with her primary care doctor as an outpatient to ensure the hemoglobin is monitored at a periodic interval to avoid hospitalizations from chronic blood loss and she should consider undergoing cryotherapy, capsule study of small bowel which was also recommended during prior hospitalizations with her primary gastroenterologist 4. Can have soft diet today   Thank you for involving me in the care of this patient.      LOS: 0 days   Courtney Bellows, MD  08/08/2022, 9:46 AM

## 2022-08-09 ENCOUNTER — Ambulatory Visit (INDEPENDENT_AMBULATORY_CARE_PROVIDER_SITE_OTHER): Payer: Medicaid Other | Admitting: Nurse Practitioner

## 2022-08-09 ENCOUNTER — Encounter
Admission: EM | Disposition: A | Discharge status: Home Health | Payer: Self-pay | Source: Home / Self Care | Attending: Internal Medicine

## 2022-08-09 ENCOUNTER — Inpatient Hospital Stay: Payer: Medicare HMO | Admitting: Anesthesiology

## 2022-08-09 ENCOUNTER — Encounter: Payer: Self-pay | Admitting: Internal Medicine

## 2022-08-09 DIAGNOSIS — D649 Anemia, unspecified: Secondary | ICD-10-CM | POA: Diagnosis not present

## 2022-08-09 DIAGNOSIS — K31819 Angiodysplasia of stomach and duodenum without bleeding: Secondary | ICD-10-CM | POA: Diagnosis not present

## 2022-08-09 DIAGNOSIS — I5032 Chronic diastolic (congestive) heart failure: Secondary | ICD-10-CM | POA: Diagnosis not present

## 2022-08-09 HISTORY — PX: ESOPHAGOGASTRODUODENOSCOPY (EGD) WITH PROPOFOL: SHX5813

## 2022-08-09 LAB — CBC WITH DIFFERENTIAL/PLATELET
Abs Immature Granulocytes: 0.01 10*3/uL (ref 0.00–0.07)
Basophils Absolute: 0 10*3/uL (ref 0.0–0.1)
Basophils Relative: 1 %
Eosinophils Absolute: 0.2 10*3/uL (ref 0.0–0.5)
Eosinophils Relative: 4 %
HCT: 25.5 % — ABNORMAL LOW (ref 36.0–46.0)
Hemoglobin: 7.6 g/dL — ABNORMAL LOW (ref 12.0–15.0)
Immature Granulocytes: 0 %
Lymphocytes Relative: 18 %
Lymphs Abs: 0.9 10*3/uL (ref 0.7–4.0)
MCH: 23.2 pg — ABNORMAL LOW (ref 26.0–34.0)
MCHC: 29.8 g/dL — ABNORMAL LOW (ref 30.0–36.0)
MCV: 77.7 fL — ABNORMAL LOW (ref 80.0–100.0)
Monocytes Absolute: 0.4 10*3/uL (ref 0.1–1.0)
Monocytes Relative: 9 %
Neutro Abs: 3.4 10*3/uL (ref 1.7–7.7)
Neutrophils Relative %: 68 %
Platelets: 233 10*3/uL (ref 150–400)
RBC: 3.28 MIL/uL — ABNORMAL LOW (ref 3.87–5.11)
RDW: 19.7 % — ABNORMAL HIGH (ref 11.5–15.5)
WBC: 4.9 10*3/uL (ref 4.0–10.5)
nRBC: 0 % (ref 0.0–0.2)

## 2022-08-09 SURGERY — ESOPHAGOGASTRODUODENOSCOPY (EGD) WITH PROPOFOL
Anesthesia: General

## 2022-08-09 MED ORDER — PROPOFOL 1000 MG/100ML IV EMUL
INTRAVENOUS | Status: AC
  Filled 2022-08-09: qty 100

## 2022-08-09 MED ORDER — PROPOFOL 10 MG/ML IV BOLUS
INTRAVENOUS | Status: DC | PRN
  Administered 2022-08-09: 100 mg via INTRAVENOUS

## 2022-08-09 MED ORDER — LIDOCAINE HCL (CARDIAC) PF 100 MG/5ML IV SOSY
PREFILLED_SYRINGE | INTRAVENOUS | Status: DC | PRN
  Administered 2022-08-09: 100 mg via INTRAVENOUS

## 2022-08-09 MED ORDER — SODIUM CHLORIDE 0.9 % IV SOLN
200.0000 mg | Freq: Once | INTRAVENOUS | Status: AC
  Administered 2022-08-09: 200 mg via INTRAVENOUS
  Filled 2022-08-09: qty 200

## 2022-08-09 MED ORDER — SODIUM CHLORIDE 0.9 % IV SOLN
INTRAVENOUS | Status: DC
  Administered 2022-08-09: 1000 mL via INTRAVENOUS

## 2022-08-09 NOTE — Progress Notes (Signed)
OT Cancellation Note  Patient Details Name: Courtney Mack MRN: [id number] DOB: 02-Jun-1956   Cancelled Treatment:    Reason Eval/Treat Not Completed: Other (comment) (pt off the floor for EGD, OT will reattempt as able.Shanon Payor, OTD OTR/L  08/09/22, 11:58 AM

## 2022-08-09 NOTE — Progress Notes (Signed)
Patient received from EGD in stable condition.

## 2022-08-09 NOTE — Progress Notes (Signed)
Patient send to EGD in stable condition.

## 2022-08-09 NOTE — H&P (Signed)
Jonathon Bellows, MD [address] [place], [place], [place], [address] [address] [place], [place], [place], [address] Phone: [phone]  Fax: [phone]  Primary Care Physician:  [facility]   Pre-Procedure History & Physical: HPI:  Courtney Mack is a 67 y.o. female is here for an endoscopy    Past Medical History:  Diagnosis Date   Acute bilateral deep vein thrombosis (DVT) of femoral veins ([place]) 01/02/2021   Acute massive pulmonary embolism ([place]) 08/25/2020   Last Assessment & Plan:  Formatting of this note might be different from the original. Post vena caval filter and on xarelto and O2   Anemia    ARF (acute respiratory failure) ([facility])    Cellulitis of left lower leg 06/20/2021   CHF (congestive heart failure) ([place])    COVID-19    Diabetes mellitus without complication ([place])    DVT (deep venous thrombosis) ([place]) 09/06/2020   Endometrial cancer ([place])    GAVE (gastric antral vascular ectasia)    GERD (gastroesophageal reflux disease)    High cholesterol    Hx of blood clots    Hyperlipidemia    Hypertension    IDA (iron deficiency anemia) 09/21/2020   Obesity    Pneumonia 06/11/2022   recovered   Pressure injury of skin 06/21/2021   Pulmonary embolism (H[facility])    Sepsis ([place])    Thrombocytopenia ([place])    Urinary tract infection 09/13/2021    Past Surgical History:  Procedure Laterality Date   CATARACT EXTRACTION W/PHACO Right 06/04/2022   Procedure: CATARACT EXTRACTION PHACO AND INTRAOCULAR LENS PLACEMENT (IOC) COMPLICATED RIGHT  62.95  02:17.4;  Surgeon: Birder Robson, MD;  Location: [facility];  Service: Ophthalmology;  Laterality: Right;   CATARACT EXTRACTION W/PHACO Left 07/09/2022   Procedure: CATARACT EXTRACTION PHACO AND INTRAOCULAR LENS PLACEMENT (IOC);  Surgeon: Birder Robson, MD;  Location: [facility];  Service: Ophthalmology;  Laterality: Left;  22.75 1:40.3   COLONOSCOPY N/A 06/21/2021    Procedure: COLONOSCOPY;  Surgeon: Toledo, Benay Pike, MD;  Location: [facility];  Service: Gastroenterology;  Laterality: N/A;   CYSTOSCOPY W/ URETERAL STENT PLACEMENT Left 09/11/2021   Procedure: CYSTOSCOPY WITH RETROGRADE PYELOGRAM/URETERAL STENT PLACEMENT;  Surgeon: Abbie Sons, MD;  Location: [facility];  Service: Urology;  Laterality: Left;   CYSTOSCOPY W/ URETERAL STENT PLACEMENT Left 11/17/2021   Procedure: CYSTOSCOPY WITH STENT REPLACEMENT;  Surgeon: Irine Seal, MD;  Location: [facility];  Service: Urology;  Laterality: Left;   CYSTOSCOPY WITH FULGERATION  11/17/2021   Procedure: CYSTOSCOPY WITH FULGERATION;  Surgeon: Irine Seal, MD;  Location: [facility];  Service: Urology;;   CYSTOSCOPY WITH STENT PLACEMENT Left 10/17/2021   Procedure: CYSTOSCOPY WITH STENT PLACEMENT;  Surgeon: Abbie Sons, MD;  Location: [facility];  Service: Urology;  Laterality: Left;   CYSTOSCOPY WITH URETEROSCOPY, STONE BASKETRY AND STENT PLACEMENT Left 10/16/2021   Procedure: CYSTOSCOPY WITH URETEROSCOPY  AND STENT REMOVAL;  Surgeon: Abbie Sons, MD;  Location: [facility];  Service: Urology;  Laterality: Left;   ENTEROSCOPY N/A 08/17/2021   Procedure: ENTEROSCOPY;  Surgeon: Lin Landsman, MD;  Location: [facility] ENDOSCOPY;  Service: Gastroenterology;  Laterality: N/A;   ESOPHAGOGASTRODUODENOSCOPY N/A 06/21/2021   Procedure: ESOPHAGOGASTRODUODENOSCOPY (EGD);  Surgeon: Toledo, Benay Pike, MD;  Location: [facility];  Service: Gastroenterology;  Laterality: N/A;   ESOPHAGOGASTRODUODENOSCOPY (EGD) WITH PROPOFOL N/A 08/17/2021   Procedure: ESOPHAGOGASTRODUODENOSCOPY (EGD) WITH PROPOFOL;  Surgeon: Lin Landsman, MD;  Location: [place];  Service: Gastroenterology;  Laterality: N/A;   ESOPHAGOGASTRODUODENOSCOPY (EGD) WITH PROPOFOL N/A 10/14/2021   Procedure: ESOPHAGOGASTRODUODENOSCOPY (EGD) WITH PROPOFOL;  Surgeon: [name], MD;  Location: [facility];  Service: Endoscopy;  Laterality: N/A;    ESOPHAGOGASTRODUODENOSCOPY (EGD) WITH PROPOFOL N/A 02/01/2022   Procedure: ESOPHAGOGASTRODUODENOSCOPY (EGD) WITH PROPOFOL;  Surgeon: Lin Landsman, MD;  Location: [facility] ENDOSCOPY;  Service: Gastroenterology;  Laterality: N/A;   ESOPHAGOGASTRODUODENOSCOPY (EGD) WITH PROPOFOL N/A 03/15/2022   Procedure: ESOPHAGOGASTRODUODENOSCOPY (EGD) WITH PROPOFOL;  Surgeon: Jonathon Bellows, MD;  Location: [facility] ENDOSCOPY;  Service: Gastroenterology;  Laterality: N/A;   ESOPHAGOGASTRODUODENOSCOPY (EGD) WITH PROPOFOL N/A 04/18/2022   Procedure: ESOPHAGOGASTRODUODENOSCOPY (EGD) WITH PROPOFOL;  Surgeon: Lin Landsman, MD;  Location: [facility] ENDOSCOPY;  Service: Gastroenterology;  Laterality: N/A;   GIVENS CAPSULE STUDY N/A 06/22/2021   Procedure: GIVENS CAPSULE STUDY;  Surgeon: Toledo, Benay Pike, MD;  Location: [facility];  Service: Gastroenterology;  Laterality: N/A;   IR NEPHROSTOMY EXCHANGE LEFT  02/25/2022   IR NEPHROSTOMY EXCHANGE LEFT  03/27/2022   IR NEPHROSTOMY EXCHANGE LEFT  05/22/2022   IR NEPHROSTOMY EXCHANGE LEFT  07/24/2022   IR NEPHROSTOMY PLACEMENT LEFT  12/27/2021   IVC FILTER INSERTION N/A 08/18/2020   Procedure: IVC FILTER INSERTION;  Surgeon: Katha Cabal, MD;  Location: [place] [facility];  Service: Cardiovascular;  Laterality: N/A;   PERIPHERAL VASCULAR THROMBECTOMY Bilateral 01/03/2021   Procedure: PERIPHERAL VASCULAR THROMBECTOMY;  Surgeon: Algernon Huxley, MD;  Location: [place] [facility];  Service: Cardiovascular;  Laterality: Bilateral;   PULMONARY THROMBECTOMY N/A 08/18/2020   Procedure: PULMONARY THROMBECTOMY;  Surgeon: Katha Cabal, MD;  Location: [facility];  Service: Cardiovascular;  Laterality: N/A;   TUBAL LIGATION     VISCERAL ANGIOGRAPHY N/A 07/18/2021   Procedure: VISCERAL ANGIOGRAPHY;  Surgeon: Algernon Huxley, MD;  Location: [facility];  Service: Cardiovascular;  Laterality: N/A;    Prior to Admission medications   Medication Sig Start Date End  Date Taking? Authorizing Provider  acetaminophen (TYLENOL) 500 MG tablet Take 500 mg by mouth every 6 (six) hours as needed for mild pain or fever.   Yes [provider]  Ascorbic Acid (VITAMIN C) 1000 MG tablet Take 1,000 mg by mouth daily.   Yes [provider]  Difluprednate 0.05 % EMUL Place 1 drop into the right eye 2 (two) times daily. 05/31/22  Yes [provider]  fluticasone furoate-vilanterol (BREO ELLIPTA) 100-25 MCG/ACT AEPB Inhale 1 puff into the lungs daily.   Yes [provider]  furosemide (LASIX) 20 MG tablet Take 1 tablet (20 mg total) by mouth daily. Home med. 04/20/22  Yes Enzo Bi, MD  ketorolac (ACULAR) 0.5 % ophthalmic solution Place 1 drop into the right eye daily. 05/29/22  Yes [provider]  midodrine (PROAMATINE) 10 MG tablet Take 10 mg by mouth 3 (three) times daily. 08/06/22  Yes [provider]  moxifloxacin (VIGAMOX) 0.5 % ophthalmic solution Place 1 drop into the right eye daily. 05/29/22  Yes [provider]  Multiple Vitamin (MULTIVITAMIN WITH MINERALS) TABS tablet Take 1 tablet by mouth daily. 09/12/20  Yes [name], MD  pantoprazole (PROTONIX) 40 MG tablet Take 1 tablet (40 mg total) by mouth 2 (two) times daily. 03/01/22 08/01/23 Yes Sreenath, Sudheer B, MD  polyethylene glycol (MIRALAX / GLYCOLAX) 17 g packet Take 17 g by mouth daily as needed for mild constipation. 03/29/22  Yes Lavina Hamman, MD  rosuvastatin (CRESTOR) 10 MG tablet Take 1  tablet (10 mg total) by mouth daily. Patient taking differently: Take 10 mg by mouth in the morning. 10/03/20  Yes Earlie Server, MD  ferrous sulfate 325 (65 FE) MG tablet Take 1 tablet (325 mg total) by mouth 2 (two) times daily with a meal. Patient not taking: Reported on 08/07/2022 03/16/22   Lorella Nimrod, MD    Allergies as of 08/07/2022   (No Known Allergies)    Family History  Problem Relation Age of Onset   Hypertension Mother    Diabetes Mother     Diabetes Father    Hypertension Father    High Cholesterol Father    Congestive Heart Failure Father    Breast cancer Cousin     Social History   Socioeconomic History   Marital status: Single    Spouse name: Not on file   Number of children: Not on file   Years of education: Not on file   Highest education level: Not on file  Occupational History   Not on file  Tobacco Use   Smoking status: Never   Smokeless tobacco: Never   Tobacco comments:    smoked 2-4 cigarettes for about 2-3 weeks   Vaping Use   Vaping Use: Never used  Substance and Sexual Activity   Alcohol use: Not Currently   Drug use: Not Currently   Sexual activity: Not Currently  Other Topics Concern   Not on file  Social History Narrative   Not on file   Social Determinants of Health   Financial Resource Strain: Not on file  Food Insecurity: No Food Insecurity (08/08/2022)   Hunger Vital Sign    Worried About Running Out of Food in the Last Year: Never true    Ran Out of Food in the Last Year: Never true  Transportation Needs: No Transportation Needs (08/08/2022)   PRAPARE - Hydrologist (Medical): No    Lack of Transportation (Non-Medical): No  Physical Activity: Not on file  Stress: Not on file  Social Connections: Not on file  Intimate Partner Violence: Not At Risk (08/08/2022)   Humiliation, Afraid, Rape, and Kick questionnaire    Fear of Current or Ex-Partner: No    Emotionally Abused: No    Physically Abused: No    Sexually Abused: No    Review of Systems: See HPI, otherwise negative ROS  Physical Exam: BP (!) 131/55 (BP Location: Left Arm)   Pulse 67   Temp 97.6 F (36.4 C)   Resp 15   Ht '4\' 11"'$  (1.499 m)   Wt 80.5 kg   SpO2 99%   BMI 35.84 kg/m  General:   Alert,  pleasant and cooperative in NAD Head:  Normocephalic and atraumatic. Neck:  Supple; no masses or thyromegaly. Lungs:  Clear throughout to auscultation, normal respiratory effort.     Heart:  +S1, +S2, Regular rate and rhythm, No edema. Abdomen:  Soft, nontender and nondistended. Normal bowel sounds, without guarding, and without rebound.   Neurologic:  Alert and  oriented x4;  grossly normal neurologically.  Impression/Plan: Courtney Mack is here for an endoscopy  to be performed for  evaluation of anemia.     Risks, benefits, limitations, and alternatives regarding endoscopy have been reviewed with the patient.  Questions have been answered.  All parties agreeable.   Jonathon Bellows, MD  08/09/2022, 12:06 PM

## 2022-08-09 NOTE — Evaluation (Signed)
Physical Therapy Evaluation Patient Details Name: Courtney Mack MRN: [id number] DOB: 10/22/1955 Today's Date: 08/09/2022  History of Present Illness  Pt is a 67 y.o. female presenting to hospital 08/07/22 with generalized weakness and SOB with exertion; routine lab work showing Hgb of 5.1.  Pt admitted with symptomatic anemia and GAVE.   PMH includes L nephrostomy tube, gastric antral vascular ectasia, hypotension, HFpEF, DVT/PE in March 2022 s/p thrombectomy, DM, htn, IDA, endometrial CA, vaginal bleeding, acute respiratory failure with hypoxia, IVC filter insertion 08/18/2020, pulmonary thrombectomy 08/18/20, 01/03/22 s/p thrombectomy/thrombolysis secondary extensive B LE DVT.  Clinical Impression  Prior to hospital admission, pt was modified independent ambulating household distances with rollator; lives with family in 1 level home with 1 STE; no recent falls.  No c/o pain during session.  Currently pt is SBA semi-supine to sitting edge of bed; CGA with transfers using RW; CGA progressing to SBA ambulating 40 feet with RW use; and min assist for LE's sit to semi-supine in bed.  Limited activity d/t pt fatigue/generalized weakness.  Pt would benefit from skilled PT to address noted impairments and functional limitations (see below for any additional details).  Upon hospital discharge, pt would benefit from [place] and support from family.    Recommendations for follow up therapy are one component of a multi-disciplinary discharge planning process, led by the attending physician.  Recommendations may be updated based on patient status, additional functional criteria and insurance authorization.  Follow Up Recommendations Home health PT      Assistance Recommended at Discharge Intermittent Supervision/Assistance  Patient can return home with the following  A little help with walking and/or transfers;A little help with bathing/dressing/bathroom;Assistance with cooking/housework;Assist for  transportation;Help with stairs or ramp for entrance    Equipment Recommendations None recommended by PT (pt has RW, rollator, and BSC at home already)  Recommendations for Other Services  OT consult    Functional Status Assessment Patient has had a recent decline in their functional status and demonstrates the ability to make significant improvements in function in a reasonable and predictable amount of time.     Precautions / Restrictions Precautions Precautions: Fall Precaution Comments: NPO Restrictions Weight Bearing Restrictions: No      Mobility  Bed Mobility Overal bed mobility: Needs Assistance Bed Mobility: Supine to Sit, Sit to Supine     Supine to sit: Supervision, HOB elevated (increased effort to perform on own) Sit to supine: Min assist (assist for B LE's)   General bed mobility comments: pt normally sleeps in lift chair    Transfers Overall transfer level: Needs assistance Equipment used: Rolling walker (2 wheels) Transfers: Sit to/from Stand Sit to Stand: Min guard           General transfer comment: fairly strong stand from bed    Ambulation/Gait Ambulation/Gait assistance: Min guard, Supervision Gait Distance (Feet): 40 Feet Assistive device: Rolling walker (2 wheels)   Gait velocity: decreased     General Gait Details: decreased B LE step length; steady with walker use  Stairs            Wheelchair Mobility    Modified Rankin (Stroke Patients Only)       Balance Overall balance assessment: Needs assistance Sitting-balance support: No upper extremity supported, Feet supported Sitting balance-Leahy Scale: Good Sitting balance - Comments: steady sitting reaching within BOS   Standing balance support: Bilateral upper extremity supported, During functional activity, Reliant on assistive device for balance Standing balance-Leahy Scale: Good Standing balance comment: steady  ambulating with walker use                              Pertinent Vitals/Pain Pain Assessment Pain Assessment: No/denies pain HR 80-104 bpm and O2 sats WFL on room air during sessions activities.    Home Living Family/patient expects to be discharged to:: Private residence Living Arrangements: Children (Pt's daughter and her children (ages 81 to 99 y.o.)) Available Help at Discharge: Family;Available 24 hours/day Type of Home: House Home Access: Stairs to enter Entrance Stairs-Rails: None Entrance Stairs-Number of Steps: 1   Home Layout: One level Home Equipment: Rollator (4 wheels);BSC/3in1;Rolling Walker (2 wheels) Additional Comments: Sleeps in lift chair    Prior Function Prior Level of Function : Independent/Modified Independent             Mobility Comments: Ambulatory household distances with rollator.  Pt reports no recent falls.  Pt reports her family assists her with 1 step into home.       Hand Dominance        Extremity/Trunk Assessment   Upper Extremity Assessment Upper Extremity Assessment: Overall WFL for tasks assessed    Lower Extremity Assessment Lower Extremity Assessment: Generalized weakness    Cervical / Trunk Assessment Cervical / Trunk Assessment: Other exceptions Cervical / Trunk Exceptions: forward head/shoulders  Communication   Communication: No difficulties  Cognition Arousal/Alertness: Awake/alert Behavior During Therapy: WFL for tasks assessed/performed Overall Cognitive Status: Within Functional Limits for tasks assessed                                          General Comments General comments (skin integrity, edema, etc.): L nephrostomy tube in place (no drainage noted on dressing beginning/end of session).  Nursing cleared pt for participation in physical therapy.  Pt agreeable to PT session.    Exercises     Assessment/Plan    PT Assessment Patient needs continued PT services  PT Problem List Decreased strength;Decreased activity  tolerance;Decreased balance;Decreased mobility       PT Treatment Interventions DME instruction;Gait training;Stair training;Functional mobility training;Therapeutic activities;Therapeutic exercise;Balance training;Patient/family education    PT Goals (Current goals can be found in the Care Plan section)  Acute Rehab PT Goals Patient Stated Goal: to improve strength and mobility PT Goal Formulation: With patient Time For Goal Achievement: 08/23/22 Potential to Achieve Goals: Good    Frequency Min 2X/week     Co-evaluation               AM-PAC PT "6 Clicks" Mobility  Outcome Measure Help needed turning from your back to your side while in a flat bed without using bedrails?: None Help needed moving from lying on your back to sitting on the side of a flat bed without using bedrails?: A Little Help needed moving to and from a bed to a chair (including a wheelchair)?: A Little Help needed standing up from a chair using your arms (e.g., wheelchair or bedside chair)?: A Little Help needed to walk in hospital room?: A Little Help needed climbing 3-5 steps with a railing? : A Lot 6 Click Score: 18    End of Session Equipment Utilized During Treatment: Gait belt (up high away from nephrostomy tube) Activity Tolerance: Patient tolerated treatment well Patient left: in bed;with call bell/phone within reach;with bed alarm set Nurse Communication: Mobility status;Precautions PT Visit  Diagnosis: Other abnormalities of gait and mobility (R26.89);Muscle weakness (generalized) (M62.81)    Time: 5102-5852 PT Time Calculation (min) (ACUTE ONLY): 20 min   Charges:   PT Evaluation $PT Eval Low Complexity: 1 Low         Esaw Knippel, PT 08/09/22, 8:51 AM

## 2022-08-09 NOTE — Transfer of Care (Signed)
Immediate Anesthesia Transfer of Care Note  Patient: Courtney Mack  Procedure(s) Performed: ESOPHAGOGASTRODUODENOSCOPY (EGD) WITH PROPOFOL  Patient Location: Endoscopy Unit  Anesthesia Type:General  Level of Consciousness: drowsy  Airway & Oxygen Therapy: Patient Spontanous Breathing  Post-op Assessment: Report given to RN and Post -op Vital signs reviewed and stable  Post vital signs: Reviewed and stable  Last Vitals:  Vitals Value Taken Time  BP 128/64 08/09/22 1239  Temp 36.9 C 08/09/22 1239  Pulse 76 08/09/22 1240  Resp 22 08/09/22 1240  SpO2 96 % 08/09/22 1240  Vitals shown include unvalidated device data.  Last Pain:  Vitals:   08/09/22 1239  TempSrc: Temporal  PainSc: 0-No pain         Complications: No notable events documented.

## 2022-08-09 NOTE — Progress Notes (Signed)
Progress Note    Courtney Mack  WFU:[id number] DOB: 22-Jun-1956  DOA: 08/07/2022 PCP: [facility] [place]      Brief Narrative:    Medical records reviewed and are as summarized below:  Courtney Mack is a 67 y.o. female with medical history significant for chronic iron deficiency anemia in the setting of gastric antral vascular ectasia (GAVE), hypertension, chronic diastolic CHF, hyperlipidemia, left nephrostomy tube, history of pulmonary embolism (June 2022) and DVT (Feb 2022), s/p IVC filter in February 2022, taken off Xarelto in October 2022 because of recurrent GI bleed.  She presented to the hospital because of low hemoglobin of 5.1.  She had lab work done in the outpatient setting and was called to come to the emergency department because of low hemoglobin.  She complains of worsening dyspnea on exertion, fatigue.  No abdominal pain, hematemesis, hemoptysis or hematochezia.   She was admitted to the hospital for severe symptomatic anemia.     Assessment/Plan:   Active Problems:   Acute on chronic anemia   Gastric antral vascular ectasia   Chronic diastolic CHF (congestive heart failure) (HCC)   Hypotension   COPD (chronic obstructive pulmonary disease) (HCC)   Symptomatic anemia    Body mass index is 35.84 kg/m.  (Obesity)  Severe anemia, symptomatic: Hemoglobin was 5.1 on admission. Hb is 7.6 today. S/p transfusion with 3 units of prbcs IV iron sucrose on 08/08/2022.Marland Kitchen  Repeat 1 dose of IV iron sucrose today   History of GAVE: Continue Protonix.  Plan for EGD today.  Follow-up with gastroenterologist.   Hypokalemia: Improved   Chronic diastolic CHF: Compensated.  Continue Lasix   COPD: Compensated.  Continue bronchodilators   History of DVT and pulmonary embolism no longer on Xarelto.  She has an IVC filter in place.   History of hypotension: BP is optimal.  She was on midodrine prior to admission.  Monitor BP  closely.   Diet Order             Diet NPO time specified Except for: Sips with Meds  Diet effective midnight                            Consultants: Gastroenterologist  Procedures: None    Medications:    sodium chloride   Intravenous Once   fluticasone furoate-vilanterol  1 puff Inhalation Daily   furosemide  20 mg Oral Daily   iron polysaccharides  150 mg Oral Daily   pantoprazole  40 mg Oral BID   rosuvastatin  10 mg Oral Daily   Continuous Infusions:   Anti-infectives (From admission, onward)    None              Family Communication/Anticipated D/C date and plan/Code Status   DVT prophylaxis: SCDs Start: 08/07/22 1537     Code Status: Full Code  Family Communication: None Disposition Plan: Plan to discharge home tomorrow   Status is: Inpatient Remains inpatient appropriate because: Needs EGD for evaluation of severe anemia         Subjective:   No complaints. No bloody stools, abdominal pain or hematemesis.   Objective:    Vitals:   08/08/22 2058 08/09/22 0105 08/09/22 0608 08/09/22 0800  BP: (!) 112/52 (!) 109/47 (!) 111/55 (!) 131/55  Pulse: 76 73 68 67  Resp: '18 16 20 15  '$ Temp: 99 F (37.2 C) 98.9 F (37.2 C) 97.6 F (36.4  C) 97.6 F (36.4 C)  TempSrc:      SpO2: 96% 95% 98% 99%  Weight:      Height:       No data found.   Intake/Output Summary (Last 24 hours) at 08/09/2022 0953 Last data filed at 08/09/2022 0730 Gross per 24 hour  Intake 1478.33 ml  Output 1600 ml  Net -121.67 ml   Filed Weights   08/07/22 2045  Weight: 80.5 kg    Exam:    GEN: NAD SKIN: Warm and dry EYES: No pallor or icterus ENT: MMM CV: RRR PULM: CTA B ABD: soft, ND, NT, +BS CNS: AAO x 3, non focal EXT: Chronic bilateral leg swelling from chronic venous insufficiency.  No tenderness GU: Left nephrostomy tube draining amber urine   Data Reviewed:   I have personally reviewed following labs and imaging  studies:  Labs: Labs show the following:   Basic Metabolic Panel: Recent Labs  Lab 08/07/22 1157 08/08/22 0357  NA 138 139  K 3.4* 3.6  CL 103 108  CO2 24 25  GLUCOSE 115* 94  BUN 21 16  CREATININE 0.88 0.72  CALCIUM 9.2 8.7*   GFR Estimated Creatinine Clearance: 62.6 mL/min (by C-G formula based on SCr of 0.72 mg/dL). Liver Function Tests: No results for input(s): "AST", "ALT", "ALKPHOS", "BILITOT", "PROT", "ALBUMIN" in the last 168 hours. No results for input(s): "LIPASE", "AMYLASE" in the last 168 hours. No results for input(s): "AMMONIA" in the last 168 hours. Coagulation profile No results for input(s): "INR", "PROTIME" in the last 168 hours.  CBC: Recent Labs  Lab 08/07/22 1157 08/07/22 2107 08/08/22 0357 08/08/22 1651 08/09/22 0143  WBC 3.9* 3.8* 3.8*  --  4.9  NEUTROABS  --   --   --   --  3.4  HGB 5.1* 5.9* 6.3* 8.2* 7.6*  HCT 18.7* 20.3* 21.7* 26.9* 25.5*  MCV 73.9* 75.2* 76.1*  --  77.7*  PLT 299 266 251  --  233   Cardiac Enzymes: No results for input(s): "CKTOTAL", "CKMB", "CKMBINDEX", "TROPONINI" in the last 168 hours. BNP (last 3 results) No results for input(s): "PROBNP" in the last 8760 hours. CBG: No results for input(s): "GLUCAP" in the last 168 hours. D-Dimer: No results for input(s): "DDIMER" in the last 72 hours. Hgb A1c: No results for input(s): "HGBA1C" in the last 72 hours. Lipid Profile: No results for input(s): "CHOL", "HDL", "LDLCALC", "TRIG", "CHOLHDL", "LDLDIRECT" in the last 72 hours. Thyroid function studies: No results for input(s): "TSH", "T4TOTAL", "T3FREE", "THYROIDAB" in the last 72 hours.  Invalid input(s): "FREET3" Anemia work up: Recent Labs    08/07/22 1157  TIBC 480*  IRON 12*   Sepsis Labs: Recent Labs  Lab 08/07/22 1157 08/07/22 2107 08/08/22 0357 08/09/22 0143  WBC 3.9* 3.8* 3.8* 4.9    Microbiology No results found for this or any previous visit (from the past 240 hour(s)).  Procedures and  diagnostic studies:  DG Chest 2 View  Result Date: 08/07/2022 CLINICAL DATA:  Shortness of breath EXAM: CHEST - 2 VIEW COMPARISON:  Chest x-ray dated June 11, 2022 FINDINGS: The heart size and mediastinal contours are within normal limits. Both lungs are clear. The visualized skeletal structures are unremarkable. IMPRESSION: No active cardiopulmonary disease. Electronically Signed   By: Yetta Glassman M.D.   On: 08/07/2022 13:37               LOS: 1 day   [facility]   Pager  on [email or web address]. If 7PM-7AM, please contact night-coverage at [email or web address]     08/09/2022, 9:53 AM

## 2022-08-09 NOTE — Anesthesia Preprocedure Evaluation (Signed)
Anesthesia Evaluation  Patient identified by MRN, date of birth, ID band Patient awake    Reviewed: Allergy & Precautions, H&P , NPO status , Patient's Chart, lab work & pertinent test results, reviewed documented beta blocker date and time   History of Anesthesia Complications Negative for: history of anesthetic complications  Airway Mallampati: II  TM Distance: >3 FB Neck ROM: full    Dental  (+) Teeth Intact, Dental Advidsory Given   Pulmonary shortness of breath (secondary to anemia) and with exertion, neg sleep apnea, COPD,  COPD inhaler, neg recent URI   Pulmonary exam normal breath sounds clear to auscultation       Cardiovascular Exercise Tolerance: Good hypertension, On Medications (-) angina +CHF  (-) CAD, (-) Past MI and (-) Cardiac Stents (-) dysrhythmias  Rhythm:regular Rate:Normal     Neuro/Psych negative neurological ROS  negative psych ROS   GI/Hepatic Neg liver ROS,GERD  Medicated,,  Endo/Other  negative endocrine ROSdiabetes, Well Controlled    Renal/GU Renal disease     Musculoskeletal   Abdominal   Peds  Hematology  (+) Blood dyscrasia, anemia   Anesthesia Other Findings Past Medical History: 01/02/2021: Acute bilateral deep vein thrombosis (DVT) of femoral  veins ([place]) 08/25/2020: Acute massive pulmonary embolism (HCC)     Comment:  Last Assessment & Plan:  Formatting of this note might               be different from the original. Post vena caval filter               and on xarelto and O2 No date: Anemia No date: ARF (acute respiratory failure) ([place]) 06/20/2021: Cellulitis of left lower leg No date: CHF (congestive heart failure) (HCC) No date: COVID-19 No date: Diabetes mellitus without complication ([place]) 19/50/9326: DVT (deep venous thrombosis) (HCC) No date: Endometrial cancer (HCC) No date: GAVE (gastric antral vascular ectasia) No date: GERD (gastroesophageal reflux disease) No  date: High cholesterol No date: Hx of blood clots No date: Hyperlipidemia No date: Hypertension 09/21/2020: IDA (iron deficiency anemia) No date: Obesity 06/11/2022: Pneumonia     Comment:  recovered 06/21/2021: Pressure injury of skin No date: Pulmonary embolism (HCC) No date: Sepsis ([place]) No date: Thrombocytopenia ([place]) 09/13/2021: Urinary tract infection   Reproductive/Obstetrics negative OB ROS                             Anesthesia Physical Anesthesia Plan  ASA: 3  Anesthesia Plan: General   Post-op Pain Management:    Induction: Intravenous  PONV Risk Score and Plan: 3 and Propofol infusion and TIVA  Airway Management Planned: Natural Airway and Nasal Cannula  Additional Equipment:   Intra-op Plan:   Post-operative Plan:   Informed Consent: I have reviewed the patients History and Physical, chart, labs and discussed the procedure including the risks, benefits and alternatives for the proposed anesthesia with the patient or authorized representative who has indicated his/her understanding and acceptance.       Plan Discussed with: CRNA  Anesthesia Plan Comments:        Anesthesia Quick Evaluation

## 2022-08-09 NOTE — Evaluation (Signed)
Occupational Therapy Evaluation Patient Details Name: Courtney Mack MRN: [id number] DOB: 20-Jul-1955 Today's Date: 08/09/2022   History of Present Illness Pt is a 67 y.o. female presenting to hospital 08/07/22 with generalized weakness and SOB with exertion; routine lab work showing Hgb of 5.1.  Pt admitted with symptomatic anemia and GAVE.   PMH includes L nephrostomy tube, gastric antral vascular ectasia, hypotension, HFpEF, DVT/PE in March 2022 s/p thrombectomy, DM, htn, IDA, endometrial CA, vaginal bleeding, acute respiratory failure with hypoxia, IVC filter insertion 08/18/2020, pulmonary thrombectomy 08/18/20, 01/03/22 s/p thrombectomy/thrombolysis secondary extensive B LE DVT.   Clinical Impression   Chart reviewed, nurse cleared pt for participation in OT evaluation. Pt is greeted in bed, alert and oriented x4, agreeable to evaluation with encouragement, reports feeling unwell following EGD. PTA pt requires assist for LB dressing, bathing and reports daughter assists with other ADLs as needed, IADLs, amb limited household distance with rollator and has access to a mwc. Pt presents with deficits in strength, endurance, activity tolerance, balance affecting safe and optimal ADL completion. Progressing mobility/ADL tasks limited on eval however pt reports she feels she is midly weaker than baseline. Recommend HHOT following discharge to address functional deficits if pt continues to have assist for ADL/IADL as she has been receiving. OT will continue to follow acutely.      Recommendations for follow up therapy are one component of a multi-disciplinary discharge planning process, led by the attending physician.  Recommendations may be updated based on patient status, additional functional criteria and insurance authorization.   Follow Up Recommendations  Home health OT     Assistance Recommended at Discharge Intermittent Supervision/Assistance  Patient can return home with the following A  little help with walking and/or transfers;A little help with bathing/dressing/bathroom;Assistance with cooking/housework;Help with stairs or ramp for entrance;Assist for transportation    Functional Status Assessment  Patient has had a recent decline in their functional status and demonstrates the ability to make significant improvements in function in a reasonable and predictable amount of time.  Equipment Recommendations  None recommended by OT (pt has recommended equipment)    Recommendations for Other Services       Precautions / Restrictions Precautions Precautions: Fall Restrictions Weight Bearing Restrictions: No      Mobility Bed Mobility Overal bed mobility: Needs Assistance Bed Mobility: Supine to Sit, Sit to Supine     Supine to sit: Supervision, HOB elevated Sit to supine: Min assist, HOB elevated   General bed mobility comments: MIN A for sit>supine for BLE management    Transfers Overall transfer level: Needs assistance Equipment used: Rolling walker (2 wheels) Transfers: Sit to/from Stand Sit to Stand: Supervision, Min guard           General transfer comment: three lateral steps up the bed with RW with supervision-CGA      Balance Overall balance assessment: Needs assistance Sitting-balance support: No upper extremity supported, Feet supported Sitting balance-Leahy Scale: Good     Standing balance support: Bilateral upper extremity supported, During functional activity, Reliant on assistive device for balance Standing balance-Leahy Scale: Good                             ADL either performed or assessed with clinical judgement   ADL Overall ADL's : Needs assistance/impaired     Grooming: Wash/dry face;Sitting;Set up           Upper Body Dressing : Minimal assistance Upper Body  Dressing Details (indicate cue type and reason): anticipated Lower Body Dressing: Maximal assistance;Bed level Lower Body Dressing Details (indicate  cue type and reason): socks Toilet Transfer: Min Psychiatric nurse Details (indicate cue type and reason): simulated with RW Toileting- Clothing Manipulation and Hygiene: Sit to/from stand;Minimal assistance Toileting - Clothing Manipulation Details (indicate cue type and reason): pt has nephrostomy, uses pads, reports she lost control of bladder years ago             Vision Baseline Vision/History: 1 Wears glasses Patient Visual Report: No change from baseline       Perception     Praxis      Pertinent Vitals/Pain Pain Assessment Pain Assessment: Faces Faces Pain Scale: Hurts little more Pain Location: feeling sick after EGD Pain Descriptors / Indicators: Discomfort Pain Intervention(s): Monitored during session, Limited activity within patient's tolerance     Hand Dominance     Extremity/Trunk Assessment Upper Extremity Assessment Upper Extremity Assessment: Overall WFL for tasks assessed   Lower Extremity Assessment Lower Extremity Assessment: Generalized weakness       Communication Communication Communication: No difficulties   Cognition Arousal/Alertness: Awake/alert Behavior During Therapy: WFL for tasks assessed/performed Overall Cognitive Status: Within Functional Limits for tasks assessed                                       General Comments  all lines/leads intact pre/post session; pt reports feeling generally weak/unwell due to recent EGD    Exercises Other Exercises Other Exercises: edu re: role of OT, role of rehab, discharge recommendations, home safety, falls prevention, DME use   Shoulder Instructions      Home Living Family/patient expects to be discharged to:: Private residence Living Arrangements: Children Available Help at Discharge: Family;Available 24 hours/day Type of Home: House Home Access: Stairs to enter [employer] of Steps: 1 Entrance Stairs-Rails: None Home Layout: One level     Bathroom  Shower/Tub: Teacher, early years/pre: Standard     Home Equipment: Rollator (4 wheels);BSC/3in1;Rolling Environmental consultant (2 wheels)   Additional Comments: sleeps in lift chair, sponge bathes at baseline      Prior Functioning/Environment Prior Level of Function : Needs assist       Physical Assist : ADLs (physical)   ADLs (physical): IADLs;Dressing Mobility Comments: amb in house with rollator, has mwc for community distances ADLs Comments: assist for LB dressing, bathing; MOD I in UB dressing, grooming, toileting per pt report        OT Problem List: Decreased strength;Decreased activity tolerance;Decreased knowledge of use of DME or AE;Decreased knowledge of precautions      OT Treatment/Interventions: Self-care/ADL training;DME and/or AE instruction;Therapeutic activities;Therapeutic exercise;Energy conservation;Patient/family education    OT Goals(Current goals can be found in the care plan section) ADL Goals Pt Will Perform Grooming: sitting;with set-up Pt Will Transfer to Toilet: with modified independence;ambulating Pt Will Perform Toileting - Clothing Manipulation and hygiene: sit to/from stand;with modified independence  OT Frequency: Min 2X/week    Co-evaluation              AM-PAC OT "6 Clicks" Daily Activity     Outcome Measure Help from another person eating meals?: None Help from another person taking care of personal grooming?: None Help from another person toileting, which includes using toliet, bedpan, or urinal?: A Little Help from another person bathing (including washing, rinsing, drying)?: A Lot Help  from another person to put on and taking off regular upper body clothing?: A Little Help from another person to put on and taking off regular lower body clothing?: A Lot 6 Click Score: 18   End of Session Equipment Utilized During Treatment: Rolling walker (2 wheels) Nurse Communication: Mobility status  Activity Tolerance: Patient tolerated  treatment well Patient left: in bed;with call bell/phone within reach;with bed alarm set  OT Visit Diagnosis: Unsteadiness on feet (R26.81)                Time: 6759-1638 OT Time Calculation (min): 11 min Charges:  OT General Charges $OT Visit: 1 Visit OT Evaluation $OT Eval Low Complexity: 1 Low Shanon Payor, OTD OTR/L  08/09/22, 4:00 PM

## 2022-08-09 NOTE — Op Note (Signed)
[name] Procedure Date: 08/09/2022 12:04 PM MRN: [id number] Account #: [id number] Date of Birth: 11/19/1955 Admit Type: Inpatient Age: 67 Room: [facility] ENDO ROOM 4 Gender: Female Note Status: Finalized Instrument Name: Upper Endoscope [id number] Procedure:             Upper GI endoscopy Indications:           Iron deficiency anemia secondary to chronic blood loss Providers:             Jonathon Bellows MD, MD Referring MD:          Jennye Boroughs (Referring MD) Medicines:             Monitored Anesthesia Care Complications:         No immediate complications. Procedure:             Pre-Anesthesia Assessment:                        - Prior to the procedure, a History and Physical was                         performed, and patient medications, allergies and                         sensitivities were reviewed. The patient's tolerance                         of previous anesthesia was reviewed.                        - The risks and benefits of the procedure and the                         sedation options and risks were discussed with the                         patient. All questions were answered and informed                         consent was obtained.                        - ASA Grade Assessment: II - A patient with mild                         systemic disease.                        After obtaining informed consent, the endoscope was                         passed under direct vision. Throughout the procedure,                         the patient's blood pressure, pulse, and oxygen                         saturations were monitored continuously. The Endoscope  was introduced through the mouth, and advanced to the                         third part of duodenum. The upper GI endoscopy was                         accomplished with ease. The patient tolerated the                         procedure  well. Findings:      The esophagus was normal.      A single 5 mm angioectasia with bleeding on contact was found in the       duodenal bulb. Coagulation for hemostasis using argon beam at 0.5       liters/minute and 45 watts was successful.      Severe gastric antral vascular ectasia with bleeding was present in the       prepyloric region of the stomach. Coagulation for hemostasis using argon       beam at 0.5 liters/minute and 45 watts was successful.      The cardia and gastric fundus were normal on retroflexion. Impression:            - Normal esophagus.                        - A single angioectasia in the duodenum. Treated with                         argon beam coagulation.                        - Gastric antral vascular ectasia with bleeding.                         Treated with argon beam coagulation.                        - No specimens collected. Recommendation:        - Return patient to hospital ward for ongoing care.                        - Clear liquid diet today.                        - Continue on PPI Prilosec 40 mg BId for 8 weeks then                         40 mg a day long term                        Carafate QID for 6 weeks                        F/u with her primary GI to consider cryotherapy for                         recurrent GAVE Procedure Code(s):     --- Professional ---  43255, Esophagogastroduodenoscopy, flexible,                         transoral; with control of bleeding, any method Diagnosis Code(s):     --- Professional ---                        Z02.585, Angiodysplasia of stomach and duodenum with                         bleeding                        D50.0, Iron deficiency anemia secondary to blood loss                         (chronic) CPT copyright 2022 American Medical Association. All rights reserved. The codes documented in this report are preliminary and upon coder review may  be revised to meet current  compliance requirements. Jonathon Bellows, MD Jonathon Bellows MD, MD 08/09/2022 12:32:33 PM This report has been signed electronically. Number of Addenda: 0 Note Initiated On: 08/09/2022 12:04 PM Estimated Blood Loss:  Estimated blood loss: none.      [facility]

## 2022-08-10 DIAGNOSIS — K31819 Angiodysplasia of stomach and duodenum without bleeding: Secondary | ICD-10-CM | POA: Diagnosis not present

## 2022-08-10 DIAGNOSIS — I5032 Chronic diastolic (congestive) heart failure: Secondary | ICD-10-CM | POA: Diagnosis not present

## 2022-08-10 DIAGNOSIS — D649 Anemia, unspecified: Secondary | ICD-10-CM | POA: Diagnosis not present

## 2022-08-10 LAB — HEMOGLOBIN AND HEMATOCRIT, BLOOD
HCT: 29.3 % — ABNORMAL LOW (ref 36.0–46.0)
Hemoglobin: 8.7 g/dL — ABNORMAL LOW (ref 12.0–15.0)

## 2022-08-10 MED ORDER — ALUM & MAG HYDROXIDE-SIMETH 200-200-20 MG/5ML PO SUSP
30.0000 mL | ORAL | Status: DC | PRN
  Administered 2022-08-10: 30 mL via ORAL
  Filled 2022-08-10: qty 30

## 2022-08-10 MED ORDER — SUCRALFATE 1 G PO TABS: 1.0000 g | ORAL_TABLET | Freq: Four times a day (QID) | ORAL | 0 refills | Status: DC

## 2022-08-10 NOTE — Discharge Summary (Signed)
Physician Discharge Summary   Patient: Courtney Mack MRN: [id number] DOB: [birth year]  Admit date:     08/07/2022  Discharge date: 08/10/22  Discharge Physician: Jennye Boroughs   [facility]P: [facility] [place]   Recommendations at discharge:   Follow-up with PCP in 1 week Follow-up with the primary gastroenterologist for further evaluation  Discharge Diagnoses: Active Problems:   Acute on chronic anemia   Gastric antral vascular ectasia   Chronic diastolic CHF (congestive heart failure) (HCC)   Hypotension   COPD (chronic obstructive pulmonary disease) (HCC)   Symptomatic anemia  Resolved Problems:   * No resolved hospital problems. *  Hospital Course:  URVI IMES is a 67 y.o. female with medical history significant for chronic iron deficiency anemia in the setting of gastric antral vascular ectasia (GAVE), hypertension, chronic diastolic CHF, hyperlipidemia, left nephrostomy tube, history of pulmonary embolism (June 2022) and DVT (Feb 2022), s/p IVC filter in February 2022, taken off Xarelto in October 2022 because of recurrent GI bleed.  She presented to the hospital because of low hemoglobin of 5.1.  She had lab work done in the outpatient setting and was called to come to the emergency department because of low hemoglobin.  She complains of worsening dyspnea on exertion, fatigue.  No abdominal pain, hematemesis, hemoptysis or hematochezia.     She was admitted to the hospital for severe symptomatic anemia.    Assessment and Plan:   Severe anemia, symptomatic: Hemoglobin was 5.1 on admission.  Hemoglobin improved to 8.7 prior to discharge.  S/p transfusion with 3 units of prbcs.  S/p infusion with 2 doses of IV iron sucrose.       Gastric antral vascular ectasia with bleeding found on EGD.  This was treated with argon beam coagulation.  A single angioectasia in the duodenum was also treated with argon beam coagulation.  Carafate was prescribed as  recommended by gastroenterologist.  Continue Protonix at discharge.  Follow-up with primary gastroenterologist to consider cryotherapy for recurrent GAVE.     Hypokalemia: Improved     Chronic diastolic CHF: Compensated.  Continue Lasix     COPD: Compensated.  Continue bronchodilators     History of DVT and pulmonary embolism no longer on Xarelto.  She has an IVC filter in place.     History of hypotension: BP is optimal.  She was on midodrine prior to admission.  Monitor BP closely.      Her condition has improved and she is deemed stable for discharge to home today.       Consultants: Gastroenterologist Procedures performed: EGD Disposition: Home health Diet recommendation:  Discharge Diet Orders (From admission, onward)     Start     Ordered   08/10/22 0000  Diet - low sodium heart healthy        08/10/22 0932           Cardiac diet DISCHARGE MEDICATION: Allergies as of 08/10/2022   No Known Allergies      Medication List     STOP taking these medications    ferrous sulfate 325 (65 FE) MG tablet       TAKE these medications    acetaminophen 500 MG tablet Commonly known as: TYLENOL Take 500 mg by mouth every 6 (six) hours as needed for mild pain or fever.   Difluprednate 0.05 % Emul Place 1 drop into the right eye 2 (two) times daily.   fluticasone furoate-vilanterol 100-25 MCG/ACT Aepb Commonly known as: BREO  ELLIPTA Inhale 1 puff into the lungs daily.   furosemide 20 MG tablet Commonly known as: Lasix Take 1 tablet (20 mg total) by mouth daily. Home med.   ketorolac 0.5 % ophthalmic solution Commonly known as: ACULAR Place 1 drop into the right eye daily.   midodrine 10 MG tablet Commonly known as: PROAMATINE Take 10 mg by mouth 3 (three) times daily.   moxifloxacin 0.5 % ophthalmic solution Commonly known as: VIGAMOX Place 1 drop into the right eye daily.   multivitamin with minerals Tabs tablet Take 1 tablet by mouth daily.    pantoprazole 40 MG tablet Commonly known as: PROTONIX Take 1 tablet (40 mg total) by mouth 2 (two) times daily.   polyethylene glycol 17 g packet Commonly known as: MIRALAX / GLYCOLAX Take 17 g by mouth daily as needed for mild constipation.   rosuvastatin 10 MG tablet Commonly known as: CRESTOR Take 1 tablet (10 mg total) by mouth daily. What changed: when to take this   sucralfate 1 g tablet Commonly known as: Carafate Take 1 tablet (1 g total) by mouth 4 (four) times daily.   vitamin C 1000 MG tablet Take 1,000 mg by mouth daily.        Discharge Exam: Filed Weights   08/07/22 2045  Weight: 80.5 kg   GEN: NAD SKIN: Warm and dry EYES: No pallor or icterus ENT: MMM CV: RRR PULM: CTA B ABD: soft, ND, NT, +BS CNS: AAO x 3, non focal EXT: No edema or tenderness   Condition at discharge: good  The results of significant diagnostics from this hospitalization (including imaging, microbiology, ancillary and laboratory) are listed below for reference.   Imaging Studies: DG Chest 2 View  Result Date: 08/07/2022 CLINICAL DATA:  Shortness of breath EXAM: CHEST - 2 VIEW COMPARISON:  Chest x-ray dated June 11, 2022 FINDINGS: The heart size and mediastinal contours are within normal limits. Both lungs are clear. The visualized skeletal structures are unremarkable. IMPRESSION: No active cardiopulmonary disease. Electronically Signed   By: Yetta Glassman M.D.   On: 08/07/2022 13:37   IR NEPHROSTOMY EXCHANGE LEFT  Result Date: 07/24/2022 INDICATION: Chronic indwelling left nephrostomy, routine exchange EXAM: FLUOROSCOPIC EXCHANGE 10 FRENCH LEFT NEPHROSTOMY COMPARISON:  05/22/2022 MEDICATIONS: 1% LIDOCAINE LOCAL ANESTHESIA/SEDATION: None. CONTRAST:  10 cc-administered into the collecting system(s) FLUOROSCOPY: Radiation Exposure Index (as provided by the fluoroscopic device): 13 mGy Kerma COMPLICATIONS: None immediate. PROCEDURE: Informed written consent was obtained from the  patient after a thorough discussion of the procedural risks, benefits and alternatives. All questions were addressed. Maximal Sterile Barrier Technique was utilized including caps, mask, sterile gowns, sterile gloves, sterile drape, hand hygiene and skin antiseptic. A timeout was performed prior to the initiation of the procedure. Under sterile conditions and local anesthesia, the existing left nephrostomy catheter was successfully exchanged over an Amplatz guidewire. Retention loop formed in the renal pelvis. Contrast injection confirms position. Images obtained for documentation. Catheter secured with a silk suture and connected to external gravity drainage bag. Sterile dressing applied. No immediate complication. Patient tolerated the procedure well. IMPRESSION: Successful left nephrostomy catheter exchange. Electronically Signed   By: Jerilynn Mages.  Shick M.D.   On: 07/24/2022 15:07    Microbiology: Results for orders placed or performed during the hospital encounter of 06/11/22  Resp Panel by RT-PCR (Flu A&B, Covid) Anterior Nasal Swab     Status: None   Collection Time: 06/11/22  3:44 PM   Specimen: Anterior Nasal Swab  Result Value Ref Range  Status   SARS Coronavirus 2 by RT PCR NEGATIVE NEGATIVE Final    Comment: (NOTE) SARS-CoV-2 target nucleic acids are NOT DETECTED.  The SARS-CoV-2 RNA is generally detectable in upper respiratory specimens during the acute phase of infection. The lowest concentration of SARS-CoV-2 viral copies this assay can detect is 138 copies/mL. A negative result does not preclude SARS-Cov-2 infection and should not be used as the sole basis for treatment or other patient management decisions. A negative result may occur with  improper specimen collection/handling, submission of specimen other than nasopharyngeal swab, presence of viral mutation(s) within the areas targeted by this assay, and inadequate number of viral copies(<138 copies/mL). A negative result must be  combined with clinical observations, patient history, and epidemiological information. The expected result is Negative.  Fact Sheet for Patients:  EntrepreneurPulse.com.au  Fact Sheet for Healthcare Providers:  IncredibleEmployment.be  This test is no t yet approved or cleared by the [place] FDA and  has been authorized for detection and/or diagnosis of SARS-CoV-2 by FDA under an Emergency Use Authorization (EUA). This EUA will remain  in effect (meaning this test can be used) for the duration of the COVID-19 declaration under Section 564(b)(1) of the Act, 21 U.S.C.section 360bbb-3(b)(1), unless the authorization is terminated  or revoked sooner.       Influenza A by PCR NEGATIVE NEGATIVE Final   Influenza B by PCR NEGATIVE NEGATIVE Final    Comment: (NOTE) The Xpert Xpress SARS-CoV-2/FLU/RSV plus assay is intended as an aid in the diagnosis of influenza from Nasopharyngeal swab specimens and should not be used as a sole basis for treatment. Nasal washings and aspirates are unacceptable for Xpert Xpress SARS-CoV-2/FLU/RSV testing.  Fact Sheet for Patients: EntrepreneurPulse.com.au  Fact Sheet for Healthcare Providers: IncredibleEmployment.be  This test is not yet approved or cleared by the [place] FDA and has been authorized for detection and/or diagnosis of SARS-CoV-2 by FDA under an Emergency Use Authorization (EUA). This EUA will remain in effect (meaning this test can be used) for the duration of the COVID-19 declaration under Section 564(b)(1) of the Act, 21 U.S.C. section 360bbb-3(b)(1), unless the authorization is terminated or revoked.  Performed at [facility], [address] [place], [place] [address]   Urine Culture     Status: Abnormal   Collection Time: 06/12/22 12:05 AM   Specimen: Urine, Clean Catch  Result Value Ref Range Status   Specimen Description   Final     URINE, CLEAN CATCH Performed at [facility], [address], [place], [place] [address]    Special Requests   Final    NONE Performed at [facility], [address] [place], [place] [address]    Culture MULTIPLE SPECIES PRESENT, SUGGEST RECOLLECTION (A)  Final   Report Status 06/13/2022 FINAL  Final  Urine Culture     Status: Abnormal   Collection Time: 06/12/22  5:55 AM   Specimen: Urine, Catheterized  Result Value Ref Range Status   Specimen Description   Final    URINE, CATHETERIZED Performed at [facility], [address], [place], [place] [address]    Special Requests   Final    NONE Performed at [facility], [place],  [address]    Culture 80,000 COLONIES/mL ENTEROCOCCUS FAECALIS (A)  Final   Report Status 06/15/2022 FINAL  Final   Organism ID, Bacteria ENTEROCOCCUS FAECALIS (A)  Final      Susceptibility   Enterococcus faecalis - MIC*  AMPICILLIN <=2 SENSITIVE Sensitive     NITROFURANTOIN <=16 SENSITIVE Sensitive     VANCOMYCIN 1 SENSITIVE Sensitive     * 80,000 COLONIES/mL ENTEROCOCCUS FAECALIS    Labs: CBC: Recent Labs  Lab 08/07/22 1157 08/07/22 2107 08/08/22 0357 08/08/22 1651 08/09/22 0143 08/10/22 0555  WBC 3.9* 3.8* 3.8*  --  4.9  --   NEUTROABS  --   --   --   --  3.4  --   HGB 5.1* 5.9* 6.3* 8.2* 7.6* 8.7*  HCT 18.7* 20.3* 21.7* 26.9* 25.5* 29.3*  MCV 73.9* 75.2* 76.1*  --  77.7*  --   PLT 299 266 251  --  233  --    Basic Metabolic Panel: Recent Labs  Lab 08/07/22 1157 08/08/22 0357  NA 138 139  K 3.4* 3.6  CL 103 108  CO2 24 25  GLUCOSE 115* 94  BUN 21 16  CREATININE 0.88 0.72  CALCIUM 9.2 8.7*   Liver Function Tests: No results for input(s): "AST", "ALT", "ALKPHOS", "BILITOT", "PROT", "ALBUMIN" in the last 168 hours. CBG: No results for input(s): "GLUCAP" in the last 168 hours.  Discharge time spent: greater than 30 minutes.  Signed: Jennye Boroughs,  MD [facility] 08/10/2022

## 2022-08-10 NOTE — Progress Notes (Signed)
Patient is being discharged home. Discharge papers given and explained to patient. She verbalized understanding. Meds and f/u appointments reviewed. Rx was sent electronically to the pharmacy. Patient made aware.  Awaiting transportation.

## 2022-08-10 NOTE — TOC CM/SW Note (Addendum)
Patient known to [facility] for recent admission.  Patient is active with [facility]. CSW notified [facility] Representative Alwyn Ren that patient has orders to DC home today. Alwyn Ren confirmed they will continue to follow for [facility], -- she confirmed they can follow for PT and can add OT as recommended.   Asked MD for resumption HH orders.  Courtney Mack, [place]

## 2022-08-11 LAB — TYPE AND SCREEN
ABO/RH(D): A POS
Antibody Screen: POSITIVE
Donor AG Type: NEGATIVE
Donor AG Type: NEGATIVE
Donor AG Type: NEGATIVE
Unit division: 0
Unit division: 0
Unit division: 0
Unit division: 0
Unit division: 0

## 2022-08-11 LAB — BPAM RBC
Blood Product Expiration Date: 202402232359
Blood Product Expiration Date: 202403012359
Blood Product Expiration Date: 202403012359
Blood Product Expiration Date: 202403042359
Blood Product Expiration Date: 202403042359
ISSUE DATE / TIME: 202401311610
ISSUE DATE / TIME: 202401312109
ISSUE DATE / TIME: 202402010954
Unit Type and Rh: 6200
Unit Type and Rh: 6200
Unit Type and Rh: 6200
Unit Type and Rh: 6200
Unit Type and Rh: 6200

## 2022-08-12 ENCOUNTER — Encounter: Payer: Self-pay | Admitting: Gastroenterology

## 2022-08-21 ENCOUNTER — Encounter: Payer: Self-pay | Admitting: Family Medicine

## 2022-08-26 ENCOUNTER — Ambulatory Visit (INDEPENDENT_AMBULATORY_CARE_PROVIDER_SITE_OTHER): Payer: Medicaid Other | Admitting: Nurse Practitioner

## 2022-08-29 NOTE — Anesthesia Postprocedure Evaluation (Signed)
Anesthesia Post Note  Patient: Courtney Mack  Procedure(s) Performed: ESOPHAGOGASTRODUODENOSCOPY (EGD) WITH PROPOFOL  Patient location during evaluation: Endoscopy Anesthesia Type: General Level of consciousness: awake and alert Pain management: pain level controlled Vital Signs Assessment: post-procedure vital signs reviewed and stable Respiratory status: spontaneous breathing, nonlabored ventilation, respiratory function stable and patient connected to nasal cannula oxygen Cardiovascular status: blood pressure returned to baseline and stable Postop Assessment: no apparent nausea or vomiting Anesthetic complications: no   No notable events documented.   Last Vitals:  Vitals:   08/10/22 0500 08/10/22 0808  BP: (!) 126/59 (!) 108/51  Pulse: 66 69  Resp: 15 16  Temp:  36.8 C  SpO2: 97% 96%    Last Pain:  Vitals:   08/10/22 1015  TempSrc:   PainSc: 0-No pain                 Martha Clan

## 2022-09-02 ENCOUNTER — Ambulatory Visit (INDEPENDENT_AMBULATORY_CARE_PROVIDER_SITE_OTHER): Payer: Medicaid Other | Admitting: Nurse Practitioner

## 2022-09-04 ENCOUNTER — Ambulatory Visit: Payer: Medicare HMO | Admitting: Urology

## 2022-09-09 ENCOUNTER — Inpatient Hospital Stay: Payer: Medicare HMO | Attending: Oncology | Admitting: Oncology

## 2022-09-09 ENCOUNTER — Inpatient Hospital Stay: Payer: Medicare HMO

## 2022-09-09 ENCOUNTER — Encounter: Payer: Self-pay | Admitting: Oncology

## 2022-09-09 VITALS — BP 139/59 | HR 75 | Temp 97.3°F | Wt 176.8 lb

## 2022-09-09 DIAGNOSIS — Z803 Family history of malignant neoplasm of breast: Secondary | ICD-10-CM | POA: Diagnosis not present

## 2022-09-09 DIAGNOSIS — Z86718 Personal history of other venous thrombosis and embolism: Secondary | ICD-10-CM | POA: Diagnosis not present

## 2022-09-09 DIAGNOSIS — I509 Heart failure, unspecified: Secondary | ICD-10-CM | POA: Insufficient documentation

## 2022-09-09 DIAGNOSIS — C541 Malignant neoplasm of endometrium: Secondary | ICD-10-CM | POA: Diagnosis not present

## 2022-09-09 DIAGNOSIS — Z86711 Personal history of pulmonary embolism: Secondary | ICD-10-CM | POA: Insufficient documentation

## 2022-09-09 DIAGNOSIS — Z923 Personal history of irradiation: Secondary | ICD-10-CM | POA: Insufficient documentation

## 2022-09-09 DIAGNOSIS — D500 Iron deficiency anemia secondary to blood loss (chronic): Secondary | ICD-10-CM

## 2022-09-09 DIAGNOSIS — R310 Gross hematuria: Secondary | ICD-10-CM | POA: Insufficient documentation

## 2022-09-09 DIAGNOSIS — Z87440 Personal history of urinary (tract) infections: Secondary | ICD-10-CM | POA: Diagnosis not present

## 2022-09-09 DIAGNOSIS — E119 Type 2 diabetes mellitus without complications: Secondary | ICD-10-CM | POA: Diagnosis not present

## 2022-09-09 DIAGNOSIS — I82409 Acute embolism and thrombosis of unspecified deep veins of unspecified lower extremity: Secondary | ICD-10-CM | POA: Diagnosis not present

## 2022-09-09 DIAGNOSIS — I110 Hypertensive heart disease with heart failure: Secondary | ICD-10-CM | POA: Insufficient documentation

## 2022-09-09 DIAGNOSIS — D509 Iron deficiency anemia, unspecified: Secondary | ICD-10-CM | POA: Diagnosis present

## 2022-09-09 LAB — CBC WITH DIFFERENTIAL/PLATELET
Abs Immature Granulocytes: 0.02 10*3/uL (ref 0.00–0.07)
Basophils Absolute: 0 10*3/uL (ref 0.0–0.1)
Basophils Relative: 1 %
Eosinophils Absolute: 0.3 10*3/uL (ref 0.0–0.5)
Eosinophils Relative: 6 %
HCT: 30 % — ABNORMAL LOW (ref 36.0–46.0)
Hemoglobin: 9.2 g/dL — ABNORMAL LOW (ref 12.0–15.0)
Immature Granulocytes: 0 %
Lymphocytes Relative: 16 %
Lymphs Abs: 0.7 10*3/uL (ref 0.7–4.0)
MCH: 24.9 pg — ABNORMAL LOW (ref 26.0–34.0)
MCHC: 30.7 g/dL (ref 30.0–36.0)
MCV: 81.1 fL (ref 80.0–100.0)
Monocytes Absolute: 0.3 10*3/uL (ref 0.1–1.0)
Monocytes Relative: 7 %
Neutro Abs: 3.2 10*3/uL (ref 1.7–7.7)
Neutrophils Relative %: 70 %
Platelets: 229 10*3/uL (ref 150–400)
RBC: 3.7 MIL/uL — ABNORMAL LOW (ref 3.87–5.11)
RDW: 24.1 % — ABNORMAL HIGH (ref 11.5–15.5)
WBC: 4.6 10*3/uL (ref 4.0–10.5)
nRBC: 0 % (ref 0.0–0.2)

## 2022-09-09 LAB — IRON AND TIBC
Iron: 21 ug/dL — ABNORMAL LOW (ref 28–170)
Saturation Ratios: 6 % — ABNORMAL LOW (ref 10.4–31.8)
TIBC: 353 ug/dL (ref 250–450)
UIBC: 332 ug/dL

## 2022-09-09 LAB — FERRITIN: Ferritin: 20 ng/mL (ref 11–307)

## 2022-09-09 MED FILL — Iron Sucrose Inj 20 MG/ML (Fe Equiv): INTRAVENOUS | Qty: 10 | Status: AC

## 2022-09-10 ENCOUNTER — Telehealth: Payer: Self-pay

## 2022-09-10 ENCOUNTER — Encounter: Payer: Self-pay | Admitting: Oncology

## 2022-09-10 NOTE — Progress Notes (Signed)
Hematology/Oncology Progress note Telephone:([phone] [phone]      Patient Care Team: Center, [facility] as PCP - General (General Practice) Clent Jacks, RN as Oncology Nurse Navigator Earlie Server, MD as Consulting Physician (Hematology and Oncology)  ASSESSMENT & PLAN:   IDA (iron deficiency anemia) #Iron deficiency anemia, Hb 9.2, iron panel consistent with iron deficiency anemia.  Recommend  IV Venofer weekly x 5  Endometrial cancer (HCC) Stage II endometrial cancer with cervical involvement or cervical cancer.  Not a candidate of surgery and chemotherapy.  Status post radiation and brachytherapy. She is overdue for follow up with gynecology oncology, I will touch base with RN coordinator   Recurrent deep vein thrombosis (DVT) (HCC) S/P IVC, Not able to tolerate anticoagulation due to gross hematuria    Orders Placed This Encounter  Procedures   Ferritin    Standing Status:   Future    Standing Expiration Date:   03/13/2023   Iron and TIBC    Standing Status:   Future    Standing Expiration Date:   09/10/2023   CBC with Differential/Platelet    Standing Status:   Future    Standing Expiration Date:   09/10/2023   Follow up in 3 months.  All questions were answered. The patient knows to call the clinic with any problems, questions or concerns.  Earlie Server, MD, PhD [facility] Hematology Oncology 09/09/2022   CHIEF COMPLAINTS/REASON FOR VISIT:   endometrial cancer, iron deficiency anemia, recurrent thrombosis  HISTORY OF PRESENTING ILLNESS:   Courtney Mack is a  67 y.o.  female with PMH listed below was seen in consultation at the request of  Center, [place]*  for evaluation of endometrial cancer.  #February 2022, patient was hospitalized to due to left lower extremity DVT, extensive bilateral PE, status post embolectomy and IVC filter placement.  Patient was discharged on anticoagulation with Xarelto. Patient reports  that she has had intermittent vaginal bleeding for the past few years.  After she was started on Xarelto, she started to experience frequent vaginal bleeding 09/06/2020, US pelvic complete showed a diffusely thickened and heterogeneous appearance of the endometrium, concerning for endometrial carcinoma.-  Uterus: Measurements: 10.4 x 5.2 x 5.9 cm = volume: 166 mL. No fibroids or other mass visualized. Endometrium Thickness: 29. The endometrium is diffusely heterogeneous and thickened with increased vascularity.Right and left ovaries: Not visualized.  No abnormal free fluid  Patient was evaluated by gynecology Dr. Amalia Hailey.  On physical examination, patient had malodorous friable necrotic appearing cervix was seen. Cervical biopsy performed. An endometrial biopsy was attempted but the cervical os was not well visualized.   # 09/06/2020 Cervical and endometrial biopsies A. UTERINE CERVIX; BIOPSY:   ENDOMETRIOID ENDOMETRIAL ADENOCARCINOMA, FAVOR LOW GRADE (FIGO GRADE 1-2).   B. ENDOMETRIUM; BIOPSY: - ENDOMETRIOID ENDOMETRIAL ADENOCARCINOMA, FAVOR LOW GRADE (FIGO GRADE 1-2).  She presents for evaluation. She continues to have bleeding.  Patient was seen by gynecology oncology Dr. Theora Gianotti on 09/13/2020.  Patient is not a surgical candidate.  Refer to radiation oncology and my dog for further evaluation.  Patient presented to emergency room on 09/16/2020, 09/20/2020 for vaginal bleeding and passing clots.. 09/16/2020, hemoglobin 9.7, MCV 80 09/20/2020, hemoglobin 8.8, MCV 80.5. 08/24/2020, iron saturation 5, ferritin 12.  Today patient has been seen by radiation oncology.  PET scan has been scheduled on 09/28/2020. Due to the ongoing bleeding, decision was made to proceed with radiation on 09/25/2020. She reports very tired and  fatigued.  Ongoing vaginal bleeding and passing blood clots..  Denies any chest pain, shortness of breath.Some left lower abdomen discomfort.  Chronic other medical problems include  hypertension, morbid obesity, diabetes, chronic anticoagulation for recent bilateral extensive PE and a DVT.  01/02/2021 - 01/05/2021, patient was hospitalized due to worsening of bilateral lower extremity swelling.  She was found to have extensive occlusive thrombus throughout both femoral vein.  CT angiogram of the chest also showed small volume of chronic and nonocclusive PE. Patient was started on heparin drip.  Status post embolectomy by vascular surgeon.  Patient at that time claimed that she has been compliant with Xarelto.  After discussing with hospitalist, decision was made to switch patient to Lovenox injection after discharge.  Prescription was sent by hospitalist.  [employer] initially did not approve her Lovenox.  Patient ended up restarted on Xarelto for anticoagulation. Today patient was accompanied by her brother. Per patient and her brother, family realized that patient had ran out of Xarelto for about at least 2 weeks prior to the onset of her symptoms. With additional history, it is possible that patient developed a recurrent thrombosis due to not being on anticoagulation.  Patient was recommended to resume Xarelto 20 mg daily.  11/16/2021 - 11/19/2021, patient was admitted due to gross hematuria.  Patient is anemic is status post PRBC transfusion. During this admission, patient had a CT scan done which showed small clot burden/pulm emboli in the right posterior pulmonary artery.  Ultrasound Doppler showed acute on chronic DVT.  Patient has been off Xarelto due to hematuria prior to the admission for approximately 1 months.  Hematuria was attributed to her ureteral stents.  Patient was started on heparin drip.Patient was evaluated by urology. S/p cystoscopy with removal and replacement of left double-J stent..   hematuria likely secondary to radiation cystitis.  Patient also  was treated for UTI with Rocephin.  Urine and blood cultures were negative.  At discharge, patient was resumed  on Xarelto 10 mg daily.  12/25/21 Venous US bilaterally lower extremities 1. Stable venous duplex of the bilateral lower extremities. Again noted is incomplete compressibility of the bilateral common femoral veins, profunda femoral veins, femoral veins and popliteal veins. Findings are most compatible with the sequelae of previous DVT. No evidence to suggest acute thrombosis. 2. Limited evaluation of the deep calf veins bilaterally.  02/24/22 Venous US bilaterally lower extremities Chronic partially occlusive thrombus noted within the right common femoral vein and proximal superficial femoral vein. Occlusive thrombus noted in the mid to distal right femoral vein. This was partially occluded previously suggesting an acute component. Chronic partially occlusive thrombus in the left common femoral vein, superficial femoral vein and popliteal vein.  03/26/22-03/29/22 Hospitalization due to complicated UTI, Bilateral hydronephrosis,Underwent nephrogram and nephrostomy tube exchange, acute on chronic anemia, s/p PRBC transfusion. Xarelto was held and resume after discharge.   04/16/22 -04/20/22 patient was hospitalized due to acute GI bleeding due to GAVE Hgb 4.8 on presentation.  Xarelto was discontinued..  S/p 3u pRBC during this hospitalization.  INTERVAL HISTORY Courtney Mack is a 67 y.o. female who has above history reviewed by me today presents for posthospitalization follow-up for recurrent thrombosis.  Gross hematuria, history of GI bleeding, history of endometrial cancer status post radiation and brachytherapy. 08/07/2022 - 08/10/2022 hospitalization due to GAVE, GI bleeding Today patient presents for posthospitalization follow-up. She denies any additional episodes of bleeding events.  + fatigue.   Review of Systems  Constitutional:  Negative for  appetite change, chills, fatigue and fever.  HENT:   Negative for hearing loss and voice change.   Eyes:  Negative for eye problems.   Respiratory:  Negative for chest tightness and cough.   Cardiovascular:  Positive for leg swelling. Negative for chest pain.  Gastrointestinal:  Negative for abdominal distention, abdominal pain and blood in stool.  Endocrine: Negative for hot flashes.  Genitourinary:  Negative for difficulty urinating, frequency and vaginal bleeding.   Musculoskeletal:  Negative for arthralgias.  Skin:  Negative for itching and rash.  Neurological:  Negative for extremity weakness.  Hematological:  Negative for adenopathy.  Psychiatric/Behavioral:  Negative for confusion.       MEDICAL HISTORY:  Past Medical History:  Diagnosis Date   Acute bilateral deep vein thrombosis (DVT) of femoral veins ([place]) 01/02/2021   Acute massive pulmonary embolism ([place]) 08/25/2020   Last Assessment & Plan:  Formatting of this note might be different from the original. Post vena caval filter and on xarelto and O2   Anemia    ARF (acute respiratory failure) ([place])    Cellulitis of left lower leg 06/20/2021   CHF (congestive heart failure) ([place])    COVID-19    Diabetes mellitus without complication ([place])    DVT (deep venous thrombosis) ([place]) 09/06/2020   Endometrial cancer (HCC)    GAVE (gastric antral vascular ectasia)    GERD (gastroesophageal reflux disease)    High cholesterol    Hx of blood clots    Hyperlipidemia    Hypertension    IDA (iron deficiency anemia) 09/21/2020   Obesity    Pneumonia 06/11/2022   recovered   Pressure injury of skin 06/21/2021   Pulmonary embolism (HCC)    Sepsis ([place])    Thrombocytopenia ([place])    Urinary tract infection 09/13/2021    SURGICAL HISTORY: Past Surgical History:  Procedure Laterality Date   CATARACT EXTRACTION W/PHACO Right 06/04/2022   Procedure: CATARACT EXTRACTION PHACO AND INTRAOCULAR LENS PLACEMENT (IOC) COMPLICATED RIGHT  99991111  02:17.4;  Surgeon: Birder Robson, MD;  Location: [place];  Service: Ophthalmology;  Laterality: Right;    CATARACT EXTRACTION W/PHACO Left 07/09/2022   Procedure: CATARACT EXTRACTION PHACO AND INTRAOCULAR LENS PLACEMENT (IOC);  Surgeon: Birder Robson, MD;  Location: [place];  Service: Ophthalmology;  Laterality: Left;  22.75 1:40.3   COLONOSCOPY N/A 06/21/2021   Procedure: COLONOSCOPY;  Surgeon: Toledo, Benay Pike, MD;  Location: [facility] ENDOSCOPY;  Service: Gastroenterology;  Laterality: N/A;   CYSTOSCOPY W/ URETERAL STENT PLACEMENT Left 09/11/2021   Procedure: CYSTOSCOPY WITH RETROGRADE PYELOGRAM/URETERAL STENT PLACEMENT;  Surgeon: Abbie Sons, MD;  Location: [facility] ORS;  Service: Urology;  Laterality: Left;   CYSTOSCOPY W/ URETERAL STENT PLACEMENT Left 11/17/2021   Procedure: CYSTOSCOPY WITH STENT REPLACEMENT;  Surgeon: Irine Seal, MD;  Location: [facility];  Service: Urology;  Laterality: Left;   CYSTOSCOPY WITH FULGERATION  11/17/2021   Procedure: CYSTOSCOPY WITH FULGERATION;  Surgeon: Irine Seal, MD;  Location: [facility];  Service: Urology;;   CYSTOSCOPY WITH STENT PLACEMENT Left 10/17/2021   Procedure: CYSTOSCOPY WITH STENT PLACEMENT;  Surgeon: Abbie Sons, MD;  Location: [facility];  Service: Urology;  Laterality: Left;   CYSTOSCOPY WITH URETEROSCOPY, STONE BASKETRY AND STENT PLACEMENT Left 10/16/2021   Procedure: CYSTOSCOPY WITH URETEROSCOPY  AND STENT REMOVAL;  Surgeon: Abbie Sons, MD;  Location: [facility];  Service: Urology;  Laterality: Left;   ENTEROSCOPY N/A 08/17/2021   Procedure: ENTEROSCOPY;  Surgeon: Lin Landsman, MD;  Location:  [facility] END[facility]COPY;  Service: Gastroenterology;  Laterality: N/A;   ESOPHAGOGASTRODUODENOSCOPY N/A 06/21/2021   Procedure: ESOPHAGOGASTRODUODENOSCOPY (EGD);  Surgeon: Toledo, Benay Pike, MD;  Location: [facility];  Service: Gastroenterology;  Laterality: N/A;   ESOPHAGOGASTRODUODENOSCOPY (EGD) WITH PROPOFOL N/A 08/17/2021   Procedure: ESOPHAGOGASTRODUODENOSCOPY (EGD) WITH PROPOFOL;  Surgeon: Lin Landsman, MD;  Location: [facility]  ENDOSCOPY;  Service: Gastroenterology;  Laterality: N/A;   ESOPHAGOGASTRODUODENOSCOPY (EGD) WITH PROPOFOL N/A 10/14/2021   Procedure: ESOPHAGOGASTRODUODENOSCOPY (EGD) WITH PROPOFOL;  Surgeon: [name], MD;  Location: [facility] ENDOSCOPY;  Service: Endoscopy;  Laterality: N/A;   ESOPHAGOGASTRODUODENOSCOPY (EGD) WITH PROPOFOL N/A 02/01/2022   Procedure: ESOPHAGOGASTRODUODENOSCOPY (EGD) WITH PROPOFOL;  Surgeon: Lin Landsman, MD;  [facility] ENDOSCOPY;  Service: Gastroenterology;  Laterality: N/A;   ESOPHAGOGASTRODUODENOSCOPY (EGD) WITH PROPOFOL N/A 03/15/2022   Procedure: ESOPHAGOGASTRODUODENOSCOPY (EGD) WITH PROPOFOL;  Surgeon: Jonathon Bellows, MD;  Location: [facility] ENDOSCOPY;  Service: Gastroenterology;  Laterality: N/A;   ESOPHAGOGASTRODUODENOSCOPY (EGD) WITH PROPOFOL N/A 04/18/2022   Procedure: ESOPHAGOGASTRODUODENOSCOPY (EGD) WITH PROPOFOL;  Surgeon: Lin Landsman, MD;  Location: [facility] ENDOSCOPY;  Service: Gastroenterology;  Laterality: N/A;   ESOPHAGOGASTRODUODENOSCOPY (EGD) WITH PROPOFOL N/A 08/09/2022   Procedure: ESOPHAGOGASTRODUODENOSCOPY (EGD) WITH PROPOFOL;  Surgeon: Jonathon Bellows, MD;  Location: [facility] ENDOSCOPY;  Service: Gastroenterology;  Laterality: N/A;   GIVENS CAPSULE STUDY N/A 06/22/2021   Procedure: GIVENS CAPSULE STUDY;  Surgeon: Toledo, Benay Pike, MD;  Location: [facility] ENDOSCOPY;  Service: Gastroenterology;  Laterality: N/A;   IR NEPHROSTOMY EXCHANGE LEFT  02/25/2022   IR NEPHROSTOMY EXCHANGE LEFT  03/27/2022   IR NEPHROSTOMY EXCHANGE LEFT  05/22/2022   IR NEPHROSTOMY EXCHANGE LEFT  07/24/2022   IR NEPHROSTOMY PLACEMENT LEFT  12/27/2021   IVC FILTER INSERTION N/A 08/18/2020   Procedure: IVC FILTER INSERTION;  Surgeon: Katha Cabal, MD;  Location: [place] CV LAB;  Service: Cardiovascular;  Laterality: N/A;   PERIPHERAL VASCULAR THROMBECTOMY Bilateral 01/03/2021   Procedure: PERIPHERAL VASCULAR THROMBECTOMY;  Surgeon: Algernon Huxley, MD;  Location: [place] CV LAB;   Service: Cardiovascular;  Laterality: Bilateral;   PULMONARY THROMBECTOMY N/A 08/18/2020   Procedure: PULMONARY THROMBECTOMY;  Surgeon: Katha Cabal, MD;  Location: [place] CV LAB;  Service: Cardiovascular;  Laterality: N/A;   TUBAL LIGATION     VISCERAL ANGIOGRAPHY N/A 07/18/2021   Procedure: VISCERAL ANGIOGRAPHY;  Surgeon: Algernon Huxley, MD;  Location: [place] CV LAB;  Service: Cardiovascular;  Laterality: N/A;    SOCIAL HISTORY: Social History   Socioeconomic History   Marital status: Single    Spouse name: Not on file   Number of children: Not on file   Years of education: Not on file   Highest education level: Not on file  Occupational History   Not on file  Tobacco Use   Smoking status: Never   Smokeless tobacco: Never   Tobacco comments:    smoked 2-4 cigarettes for about 2-3 weeks   Vaping Use   Vaping Use: Never used  Substance and Sexual Activity   Alcohol use: Not Currently   Drug use: Not Currently   Sexual activity: Not Currently  Other Topics Concern   Not on file  Social History Narrative   Not on file   Social Determinants of Health   Financial Resource Strain: Not on file  Food Insecurity: No Food Insecurity (08/08/2022)   Hunger Vital Sign    Worried About Running Out of Food in the Last Year: Never true    Ran Out of Food in the Last Year:  Never true  Transportation Needs: No Transportation Needs (08/08/2022)   PRAPARE - Hydrologist (Medical): No    Lack of Transportation (Non-Medical): No  Physical Activity: Not on file  Stress: Not on file  Social Connections: Not on file  Intimate Partner Violence: Not At Risk (08/08/2022)   Humiliation, Afraid, Rape, and Kick questionnaire    Fear of Current or Ex-Partner: No    Emotionally Abused: No    Physically Abused: No    Sexually Abused: No    FAMILY HISTORY: Family History  Problem Relation Age of Onset   Hypertension Mother    Diabetes Mother     Diabetes Father    Hypertension Father    High Cholesterol Father    Congestive Heart Failure Father    Breast cancer Cousin     ALLERGIES:  has No Known Allergies.  MEDICATIONS:  Current Outpatient Medications  Medication Sig Dispense Refill   acetaminophen (TYLENOL) 500 MG tablet Take 500 mg by mouth every 6 (six) hours as needed for mild pain or fever.     Ascorbic Acid (VITAMIN C) 1000 MG tablet Take 1,000 mg by mouth daily.     Difluprednate 0.05 % EMUL Place 1 drop into the right eye 2 (two) times daily.     fluticasone furoate-vilanterol (BREO ELLIPTA) 100-25 MCG/ACT AEPB Inhale 1 puff into the lungs daily.     furosemide (LASIX) 20 MG tablet Take 1 tablet (20 mg total) by mouth daily. Home med.     ketorolac (ACULAR) 0.5 % ophthalmic solution Place 1 drop into the right eye daily.     midodrine (PROAMATINE) 10 MG tablet Take 10 mg by mouth 3 (three) times daily.     moxifloxacin (VIGAMOX) 0.5 % ophthalmic solution Place 1 drop into the right eye daily.     Multiple Vitamin (MULTIVITAMIN WITH MINERALS) TABS tablet Take 1 tablet by mouth daily.     pantoprazole (PROTONIX) 40 MG tablet Take 1 tablet (40 mg total) by mouth 2 (two) times daily. 60 tablet 1   polyethylene glycol (MIRALAX / GLYCOLAX) 17 g packet Take 17 g by mouth daily as needed for mild constipation. 14 each 0   rosuvastatin (CRESTOR) 10 MG tablet Take 1 tablet (10 mg total) by mouth daily. (Patient taking differently: Take 10 mg by mouth in the morning.) 15 tablet 0   sucralfate (CARAFATE) 1 g tablet Take 1 tablet (1 g total) by mouth 4 (four) times daily. 168 tablet 0   No current facility-administered medications for this visit.     PHYSICAL EXAMINATION: ECOG PERFORMANCE STATUS: 2 - Symptomatic, <50% confined to bed Vitals:   09/09/22 1354  BP: (!) 139/59  Pulse: 75  Temp: (!) 97.3 F (36.3 C)  SpO2: 98%   Filed Weights   09/09/22 1354  Weight: 176 lb 12.8 oz (80.2 kg)     Physical  Exam Constitutional:      General: She is not in acute distress.    Appearance: She is obese.     Comments: Patient sits in the wheelchair  HENT:     Head: Normocephalic and atraumatic.  Eyes:     General: No scleral icterus. Cardiovascular:     Rate and Rhythm: Normal rate and regular rhythm.     Heart sounds: Normal heart sounds.  Pulmonary:     Effort: Pulmonary effort is normal. No respiratory distress.     Breath sounds: No wheezing.  Abdominal:  General: Bowel sounds are normal. There is no distension.     Palpations: Abdomen is soft.  Musculoskeletal:        General: Swelling present.     Cervical back: Normal range of motion and neck supple.     Comments: Lower extremities 3+ edema bilaterally  Skin:    General: Skin is warm and dry.     Coloration: Skin is not pale.     Findings: No erythema or rash.  Neurological:     Mental Status: She is alert and oriented to person, place, and time. Mental status is at baseline.     Cranial Nerves: No cranial nerve deficit.     Coordination: Coordination normal.  Psychiatric:        Mood and Affect: Mood normal.     LABORATORY DATA:  I have reviewed the data as listed     Latest Ref Rng & Units 09/09/2022    1:40 PM 08/10/2022    5:55 AM 08/09/2022    1:43 AM  CBC  WBC 4.0 - 10.5 K/uL 4.6   4.9   Hemoglobin 12.0 - 15.0 g/dL 9.2  8.7  7.6   Hematocrit 36.0 - 46.0 % 30.0  29.3  25.5   Platelets 150 - 400 K/uL 229   233       Latest Ref Rng & Units 08/08/2022    3:57 AM 08/07/2022   11:57 AM 06/14/2022    4:25 AM  CMP  Glucose 70 - 99 mg/dL 94  115  121   BUN 8 - 23 mg/dL '16  21  12   '$ Creatinine 0.44 - 1.00 mg/dL 0.72  0.88  0.71   Sodium 135 - 145 mmol/L 139  138  139   Potassium 3.5 - 5.1 mmol/L 3.6  3.4  3.7   Chloride 98 - 111 mmol/L 108  103  109   CO2 22 - 32 mmol/L '25  24  22   '$ Calcium 8.9 - 10.3 mg/dL 8.7  9.2  8.4     Iron/TIBC/Ferritin/ %Sat    Component Value Date/Time   IRON 21 (L) 09/09/2022 1340    TIBC 353 09/09/2022 1340   FERRITIN 20 09/09/2022 1340   IRONPCTSAT 6 (L) 09/09/2022 1340      RADIOGRAPHIC STUDIES: I have personally reviewed the radiological images as listed and agreed with the findings in the report. No results found.

## 2022-09-10 NOTE — Assessment & Plan Note (Signed)
Stage II endometrial cancer with cervical involvement or cervical cancer.  Not a candidate of surgery and chemotherapy.  Status post radiation and brachytherapy. She is overdue for follow up with gynecology oncology, I will touch base with RN coordinator

## 2022-09-10 NOTE — Telephone Encounter (Signed)
Call and spoke to pt. All question answered

## 2022-09-10 NOTE — Telephone Encounter (Signed)
-----   Message from Earlie Server, MD sent at 09/10/2022  8:37 AM EST ----- Please arrange her to have IV VENofer weekly x 5. Iron panel was pending yesterday we discussed that she may need additional IV iron.  Follow up in 3 months, labs prior to MD +/- Venofer

## 2022-09-10 NOTE — Assessment & Plan Note (Signed)
#  Iron deficiency anemia, Hb 9.2, iron panel consistent with iron deficiency anemia.  Recommend  IV Venofer weekly x 5

## 2022-09-10 NOTE — Assessment & Plan Note (Signed)
S/P IVC, Not able to tolerate anticoagulation due to gross hematuria

## 2022-09-10 NOTE — Telephone Encounter (Signed)
Please schedule and inform pt. Possible iron infusions was discussed at yesterdays visit:  IV venofer weekly x5 Labs in 3 months  MD/ +/- venofer 1-2 days after labs

## 2022-09-17 ENCOUNTER — Inpatient Hospital Stay: Payer: Medicare HMO

## 2022-09-17 VITALS — BP 110/60 | HR 70 | Temp 97.3°F | Resp 16

## 2022-09-17 DIAGNOSIS — D509 Iron deficiency anemia, unspecified: Secondary | ICD-10-CM | POA: Diagnosis not present

## 2022-09-17 DIAGNOSIS — D500 Iron deficiency anemia secondary to blood loss (chronic): Secondary | ICD-10-CM

## 2022-09-17 MED ORDER — SODIUM CHLORIDE 0.9 % IV SOLN
INTRAVENOUS | Status: DC | PRN
  Filled 2022-09-17: qty 250

## 2022-09-17 MED ORDER — SODIUM CHLORIDE 0.9 % IV SOLN
200.0000 mg | Freq: Once | INTRAVENOUS | Status: AC
  Administered 2022-09-17: 200 mg via INTRAVENOUS
  Filled 2022-09-17: qty 200

## 2022-09-18 ENCOUNTER — Ambulatory Visit: Payer: Medicare HMO | Admitting: Radiology

## 2022-09-24 ENCOUNTER — Ambulatory Visit
Admission: RE | Admit: 2022-09-24 | Discharge: 2022-09-24 | Disposition: A | Discharge status: Home / Self Care | Payer: Medicare HMO | Source: Ambulatory Visit | Attending: Interventional Radiology | Admitting: Interventional Radiology

## 2022-09-24 ENCOUNTER — Inpatient Hospital Stay: Payer: Medicare HMO

## 2022-09-24 ENCOUNTER — Other Ambulatory Visit: Payer: Self-pay | Admitting: Interventional Radiology

## 2022-09-24 VITALS — BP 131/46 | HR 74 | Temp 96.0°F | Resp 18

## 2022-09-24 DIAGNOSIS — D500 Iron deficiency anemia secondary to blood loss (chronic): Secondary | ICD-10-CM

## 2022-09-24 DIAGNOSIS — N1330 Unspecified hydronephrosis: Secondary | ICD-10-CM

## 2022-09-24 DIAGNOSIS — Z436 Encounter for attention to other artificial openings of urinary tract: Secondary | ICD-10-CM | POA: Diagnosis not present

## 2022-09-24 DIAGNOSIS — D509 Iron deficiency anemia, unspecified: Secondary | ICD-10-CM | POA: Diagnosis not present

## 2022-09-24 HISTORY — PX: IR NEPHROSTOMY EXCHANGE LEFT: IMG6069

## 2022-09-24 MED ORDER — LIDOCAINE HCL 1 % IJ SOLN
INTRAMUSCULAR | Status: AC
  Administered 2022-09-24: 5 mL
  Filled 2022-09-24: qty 20

## 2022-09-24 MED ORDER — SODIUM CHLORIDE 0.9 % IV SOLN
200.0000 mg | Freq: Once | INTRAVENOUS | Status: AC
  Administered 2022-09-24: 200 mg via INTRAVENOUS
  Filled 2022-09-24: qty 200

## 2022-09-24 MED ORDER — IOHEXOL 300 MG/ML  SOLN
50.0000 mL | Freq: Once | INTRAMUSCULAR | Status: AC | PRN
  Administered 2022-09-24: 6 mL

## 2022-09-24 MED ORDER — SODIUM CHLORIDE 0.9 % IV SOLN
Freq: Once | INTRAVENOUS | Status: AC
  Filled 2022-09-24: qty 250

## 2022-10-01 ENCOUNTER — Inpatient Hospital Stay: Payer: Medicare HMO

## 2022-10-01 DIAGNOSIS — D500 Iron deficiency anemia secondary to blood loss (chronic): Secondary | ICD-10-CM

## 2022-10-01 DIAGNOSIS — D509 Iron deficiency anemia, unspecified: Secondary | ICD-10-CM | POA: Diagnosis not present

## 2022-10-01 MED ORDER — SODIUM CHLORIDE 0.9 % IV SOLN
200.0000 mg | Freq: Once | INTRAVENOUS | Status: AC
  Administered 2022-10-01: 200 mg via INTRAVENOUS
  Filled 2022-10-01: qty 200

## 2022-10-01 MED ORDER — SODIUM CHLORIDE 0.9 % IV SOLN
INTRAVENOUS | Status: DC
  Filled 2022-10-01: qty 250

## 2022-10-01 NOTE — Patient Instructions (Signed)

## 2022-10-07 ENCOUNTER — Telehealth: Payer: Self-pay

## 2022-10-07 ENCOUNTER — Other Ambulatory Visit: Payer: Self-pay

## 2022-10-07 MED ORDER — FUROSEMIDE 20 MG PO TABS: 20.0000 mg | ORAL_TABLET | Freq: Every day | ORAL | 3 refills | Status: DC

## 2022-10-07 NOTE — Telephone Encounter (Signed)
Refill requested for furosemide. Pharmacy confirmed, refill sent. Confirmed with pt follow up appt later this month.

## 2022-10-08 ENCOUNTER — Inpatient Hospital Stay: Payer: Medicare HMO | Attending: Oncology

## 2022-10-08 VITALS — BP 120/61 | HR 71 | Temp 98.4°F | Resp 18

## 2022-10-08 DIAGNOSIS — D509 Iron deficiency anemia, unspecified: Secondary | ICD-10-CM | POA: Diagnosis present

## 2022-10-08 DIAGNOSIS — D500 Iron deficiency anemia secondary to blood loss (chronic): Secondary | ICD-10-CM

## 2022-10-08 MED ORDER — SODIUM CHLORIDE 0.9 % IV SOLN
INTRAVENOUS | Status: DC
  Filled 2022-10-08: qty 250

## 2022-10-08 MED ORDER — SODIUM CHLORIDE 0.9 % IV SOLN
200.0000 mg | Freq: Once | INTRAVENOUS | Status: AC
  Administered 2022-10-08: 200 mg via INTRAVENOUS
  Filled 2022-10-08: qty 200

## 2022-10-15 ENCOUNTER — Inpatient Hospital Stay: Payer: Medicare HMO

## 2022-10-16 ENCOUNTER — Inpatient Hospital Stay: Payer: Medicare HMO

## 2022-10-16 MED FILL — Iron Sucrose Inj 20 MG/ML (Fe Equiv): INTRAVENOUS | Qty: 10 | Status: AC

## 2022-10-17 ENCOUNTER — Inpatient Hospital Stay: Payer: Medicare HMO

## 2022-10-18 ENCOUNTER — Inpatient Hospital Stay: Payer: Medicare HMO

## 2022-10-23 MED FILL — Iron Sucrose Inj 20 MG/ML (Fe Equiv): INTRAVENOUS | Qty: 10 | Status: AC

## 2022-10-24 ENCOUNTER — Emergency Department: Payer: Medicare HMO

## 2022-10-24 ENCOUNTER — Inpatient Hospital Stay: Payer: Medicare HMO

## 2022-10-24 ENCOUNTER — Encounter: Payer: Self-pay | Admitting: Internal Medicine

## 2022-10-24 ENCOUNTER — Observation Stay
Admission: EM | Admit: 2022-10-24 | Discharge: 2022-10-26 | Disposition: A | Discharge status: Home / Self Care | Payer: Medicare HMO | Attending: Internal Medicine | Admitting: Internal Medicine

## 2022-10-24 ENCOUNTER — Other Ambulatory Visit: Payer: Self-pay

## 2022-10-24 ENCOUNTER — Telehealth: Payer: Self-pay | Admitting: Oncology

## 2022-10-24 DIAGNOSIS — J441 Chronic obstructive pulmonary disease with (acute) exacerbation: Secondary | ICD-10-CM | POA: Diagnosis not present

## 2022-10-24 DIAGNOSIS — N189 Chronic kidney disease, unspecified: Secondary | ICD-10-CM | POA: Diagnosis not present

## 2022-10-24 DIAGNOSIS — Z6835 Body mass index (BMI) 35.0-35.9, adult: Secondary | ICD-10-CM | POA: Diagnosis not present

## 2022-10-24 DIAGNOSIS — E785 Hyperlipidemia, unspecified: Secondary | ICD-10-CM | POA: Diagnosis present

## 2022-10-24 DIAGNOSIS — D649 Anemia, unspecified: Secondary | ICD-10-CM | POA: Diagnosis present

## 2022-10-24 DIAGNOSIS — R531 Weakness: Secondary | ICD-10-CM | POA: Diagnosis not present

## 2022-10-24 DIAGNOSIS — Z1152 Encounter for screening for COVID-19: Secondary | ICD-10-CM | POA: Diagnosis not present

## 2022-10-24 DIAGNOSIS — E876 Hypokalemia: Secondary | ICD-10-CM | POA: Diagnosis not present

## 2022-10-24 DIAGNOSIS — I5032 Chronic diastolic (congestive) heart failure: Secondary | ICD-10-CM | POA: Diagnosis present

## 2022-10-24 DIAGNOSIS — Z86711 Personal history of pulmonary embolism: Secondary | ICD-10-CM | POA: Diagnosis present

## 2022-10-24 DIAGNOSIS — Z8616 Personal history of COVID-19: Secondary | ICD-10-CM | POA: Insufficient documentation

## 2022-10-24 DIAGNOSIS — D509 Iron deficiency anemia, unspecified: Secondary | ICD-10-CM | POA: Diagnosis present

## 2022-10-24 DIAGNOSIS — D62 Acute posthemorrhagic anemia: Secondary | ICD-10-CM | POA: Diagnosis not present

## 2022-10-24 DIAGNOSIS — R0602 Shortness of breath: Secondary | ICD-10-CM | POA: Diagnosis present

## 2022-10-24 DIAGNOSIS — Z79899 Other long term (current) drug therapy: Secondary | ICD-10-CM | POA: Diagnosis not present

## 2022-10-24 DIAGNOSIS — Z936 Other artificial openings of urinary tract status: Secondary | ICD-10-CM | POA: Diagnosis not present

## 2022-10-24 DIAGNOSIS — Z86718 Personal history of other venous thrombosis and embolism: Secondary | ICD-10-CM | POA: Insufficient documentation

## 2022-10-24 DIAGNOSIS — K219 Gastro-esophageal reflux disease without esophagitis: Secondary | ICD-10-CM | POA: Diagnosis present

## 2022-10-24 DIAGNOSIS — L89310 Pressure ulcer of right buttock, unstageable: Secondary | ICD-10-CM | POA: Diagnosis not present

## 2022-10-24 DIAGNOSIS — E66813 Obesity, class 3: Secondary | ICD-10-CM | POA: Diagnosis present

## 2022-10-24 DIAGNOSIS — C541 Malignant neoplasm of endometrium: Secondary | ICD-10-CM | POA: Diagnosis not present

## 2022-10-24 DIAGNOSIS — J449 Chronic obstructive pulmonary disease, unspecified: Secondary | ICD-10-CM | POA: Diagnosis present

## 2022-10-24 DIAGNOSIS — L89320 Pressure ulcer of left buttock, unstageable: Secondary | ICD-10-CM | POA: Diagnosis not present

## 2022-10-24 DIAGNOSIS — N390 Urinary tract infection, site not specified: Secondary | ICD-10-CM | POA: Diagnosis present

## 2022-10-24 DIAGNOSIS — J219 Acute bronchiolitis, unspecified: Principal | ICD-10-CM

## 2022-10-24 LAB — COMPREHENSIVE METABOLIC PANEL
ALT: 9 U/L (ref 0–44)
AST: 21 U/L (ref 15–41)
Albumin: 3.3 g/dL — ABNORMAL LOW (ref 3.5–5.0)
Alkaline Phosphatase: 48 U/L (ref 38–126)
Anion gap: 12 (ref 5–15)
BUN: 23 mg/dL (ref 8–23)
CO2: 23 mmol/L (ref 22–32)
Calcium: 8.9 mg/dL (ref 8.9–10.3)
Chloride: 102 mmol/L (ref 98–111)
Creatinine, Ser: 0.99 mg/dL (ref 0.44–1.00)
GFR, Estimated: >60 mL/min (ref >60)
Glucose, Bld: 171 mg/dL — ABNORMAL HIGH (ref 70–99)
Potassium: 3.2 mmol/L — ABNORMAL LOW (ref 3.5–5.1)
Sodium: 137 mmol/L (ref 135–145)
Total Bilirubin: 1.2 mg/dL (ref 0.3–1.2)
Total Protein: 7.1 g/dL (ref 6.5–8.1)

## 2022-10-24 LAB — URINALYSIS, ROUTINE W REFLEX MICROSCOPIC
Bilirubin Urine: NEGATIVE
Glucose, UA: NEGATIVE mg/dL
Hgb urine dipstick: NEGATIVE
Ketones, ur: NEGATIVE mg/dL
Nitrite: NEGATIVE
Protein, ur: NEGATIVE mg/dL
Specific Gravity, Urine: 1.023 (ref 1.005–1.030)
pH: 6 (ref 5.0–8.0)

## 2022-10-24 LAB — BLOOD GAS, VENOUS
Acid-Base Excess: 5.4 mmol/L — ABNORMAL HIGH (ref 0.0–2.0)
Bicarbonate: 29.9 mmol/L — ABNORMAL HIGH (ref 20.0–28.0)
O2 Saturation: 32.4 %
Patient temperature: 37
pCO2, Ven: 42 mmHg — ABNORMAL LOW (ref 44–60)
pH, Ven: 7.46 — ABNORMAL HIGH (ref 7.25–7.43)
pO2, Ven: <31 mmHg — CL (ref 32–45)

## 2022-10-24 LAB — CBC WITH DIFFERENTIAL/PLATELET
Abs Immature Granulocytes: 0.01 10*3/uL (ref 0.00–0.07)
Basophils Absolute: 0 10*3/uL (ref 0.0–0.1)
Basophils Relative: 1 %
Eosinophils Absolute: 0.2 10*3/uL (ref 0.0–0.5)
Eosinophils Relative: 3 %
HCT: 27.7 % — ABNORMAL LOW (ref 36.0–46.0)
Hemoglobin: 8.1 g/dL — ABNORMAL LOW (ref 12.0–15.0)
Immature Granulocytes: 0 %
Lymphocytes Relative: 13 %
Lymphs Abs: 0.9 10*3/uL (ref 0.7–4.0)
MCH: 25.9 pg — ABNORMAL LOW (ref 26.0–34.0)
MCHC: 29.2 g/dL — ABNORMAL LOW (ref 30.0–36.0)
MCV: 88.5 fL (ref 80.0–100.0)
Monocytes Absolute: 0.3 10*3/uL (ref 0.1–1.0)
Monocytes Relative: 5 %
Neutro Abs: 5.1 10*3/uL (ref 1.7–7.7)
Neutrophils Relative %: 78 %
Platelets: 237 10*3/uL (ref 150–400)
RBC: 3.13 MIL/uL — ABNORMAL LOW (ref 3.87–5.11)
RDW: 20.5 % — ABNORMAL HIGH (ref 11.5–15.5)
WBC: 6.5 10*3/uL (ref 4.0–10.5)
nRBC: 0 % (ref 0.0–0.2)

## 2022-10-24 LAB — PROTIME-INR
INR: 1.1 (ref 0.8–1.2)
Prothrombin Time: 14.3 seconds (ref 11.4–15.2)

## 2022-10-24 LAB — PROCALCITONIN: Procalcitonin: 0.15 ng/mL

## 2022-10-24 LAB — SARS CORONAVIRUS 2 BY RT PCR: SARS Coronavirus 2 by RT PCR: NEGATIVE

## 2022-10-24 LAB — TROPONIN I (HIGH SENSITIVITY)
Troponin I (High Sensitivity): 10 ng/L (ref <18)
Troponin I (High Sensitivity): 10 ng/L (ref <18)

## 2022-10-24 LAB — APTT: aPTT: 33 seconds (ref 24–36)

## 2022-10-24 LAB — BRAIN NATRIURETIC PEPTIDE: B Natriuretic Peptide: 54.1 pg/mL (ref 0.0–100.0)

## 2022-10-24 MED ORDER — IOHEXOL 350 MG/ML SOLN
75.0000 mL | Freq: Once | INTRAVENOUS | Status: AC | PRN
  Administered 2022-10-24: 75 mL via INTRAVENOUS

## 2022-10-24 MED ORDER — METHYLPREDNISOLONE SODIUM SUCC 125 MG IJ SOLR
80.0000 mg | Freq: Every day | INTRAMUSCULAR | Status: AC
  Administered 2022-10-25: 80 mg via INTRAVENOUS
  Filled 2022-10-24: qty 2

## 2022-10-24 MED ORDER — ONDANSETRON HCL 4 MG PO TABS: 4.0000 mg | ORAL_TABLET | Freq: Four times a day (QID) | ORAL | Status: DC | PRN

## 2022-10-24 MED ORDER — SODIUM CHLORIDE 0.9 % IV SOLN
2.0000 g | INTRAVENOUS | Status: DC
  Administered 2022-10-25: 2 g via INTRAVENOUS
  Filled 2022-10-24: qty 20

## 2022-10-24 MED ORDER — IPRATROPIUM-ALBUTEROL 0.5-2.5 (3) MG/3ML IN SOLN
3.0000 mL | Freq: Once | RESPIRATORY_TRACT | Status: AC
  Administered 2022-10-24: 3 mL via RESPIRATORY_TRACT
  Filled 2022-10-24: qty 3

## 2022-10-24 MED ORDER — SODIUM CHLORIDE 0.9 % IV SOLN
2.0000 g | Freq: Once | INTRAVENOUS | Status: AC
  Administered 2022-10-24: 2 g via INTRAVENOUS
  Filled 2022-10-24: qty 20

## 2022-10-24 MED ORDER — ROSUVASTATIN CALCIUM 10 MG PO TABS
10.0000 mg | ORAL_TABLET | Freq: Every day | ORAL | Status: DC
  Administered 2022-10-24 – 2022-10-25 (×2): 10 mg via ORAL
  Filled 2022-10-24 (×2): qty 1

## 2022-10-24 MED ORDER — ONDANSETRON HCL 4 MG/2ML IJ SOLN: 4.0000 mg | Freq: Four times a day (QID) | INTRAMUSCULAR | Status: DC | PRN

## 2022-10-24 MED ORDER — IPRATROPIUM-ALBUTEROL 0.5-2.5 (3) MG/3ML IN SOLN
3.0000 mL | Freq: Four times a day (QID) | RESPIRATORY_TRACT | Status: AC
  Administered 2022-10-25: 3 mL via RESPIRATORY_TRACT
  Filled 2022-10-24: qty 3

## 2022-10-24 MED ORDER — ACETAMINOPHEN 650 MG RE SUPP: 650.0000 mg | Freq: Four times a day (QID) | RECTAL | Status: DC | PRN

## 2022-10-24 MED ORDER — SENNOSIDES-DOCUSATE SODIUM 8.6-50 MG PO TABS: 1.0000 | ORAL_TABLET | Freq: Every evening | ORAL | Status: DC | PRN

## 2022-10-24 MED ORDER — METHYLPREDNISOLONE SODIUM SUCC 125 MG IJ SOLR
125.0000 mg | Freq: Once | INTRAMUSCULAR | Status: AC
  Administered 2022-10-24: 125 mg via INTRAVENOUS
  Filled 2022-10-24: qty 2

## 2022-10-24 MED ORDER — FERROUS SULFATE 325 (65 FE) MG PO TABS
325.0000 mg | ORAL_TABLET | Freq: Two times a day (BID) | ORAL | Status: DC
  Administered 2022-10-25 – 2022-10-26 (×3): 325 mg via ORAL
  Filled 2022-10-24 (×3): qty 1

## 2022-10-24 MED ORDER — SODIUM CHLORIDE 0.9 % IV SOLN
500.0000 mg | INTRAVENOUS | Status: DC
  Administered 2022-10-25: 500 mg via INTRAVENOUS
  Filled 2022-10-24: qty 5

## 2022-10-24 MED ORDER — AZITHROMYCIN 500 MG PO TABS
500.0000 mg | ORAL_TABLET | Freq: Once | ORAL | Status: AC
  Administered 2022-10-24: 500 mg via ORAL
  Filled 2022-10-24: qty 1

## 2022-10-24 MED ORDER — ACETAMINOPHEN 325 MG PO TABS: 650.0000 mg | ORAL_TABLET | Freq: Four times a day (QID) | ORAL | Status: DC | PRN

## 2022-10-24 MED ORDER — ENOXAPARIN SODIUM 40 MG/0.4ML IJ SOSY
40.0000 mg | PREFILLED_SYRINGE | INTRAMUSCULAR | Status: DC
  Administered 2022-10-24 – 2022-10-25 (×2): 40 mg via SUBCUTANEOUS
  Filled 2022-10-24 (×2): qty 0.4

## 2022-10-24 NOTE — Hospital Course (Addendum)
Courtney Mack is a 68 year old female with history of GERD, hyperlipidemia, iron deficiency anemia, who presents to emergency department for chief concerns of shortness of breath. She is diagnosed with COPD exacerbation, was given steroids, antibiotics.  Procalcitonin level not elevated, patient treated with Zithromax, IV steroids.  Also treated with Rocephin and changed to Keflex for urinary tract infection. Condition had improved, currently medically stable to be discharged.

## 2022-10-24 NOTE — ED Provider Notes (Signed)
[facility] Provider Note    Event Date/Time   First MD Initiated Contact with Patient 10/24/22 1007     (approximate)   History   Code Sepsis   HPI  Courtney Mack is a 67 y.o. female  chronic iron deficiency anemia in the setting of gastric antral vascular ectasia (GAVE), hypertension, chronic diastolic CHF, hyperlipidemia, left nephrostomy tube, history of pulmonary embolism (June 2022) and DVT (Feb 2022), s/p IVC filter in February 2022, taken off Xarelto in October 2022 because of recurrent GI bleed   Patient reports that she has not had a fall did not hit her head.  She reports feeling short of breath and some chest discomfort that started yesterday.  When EMS got there she was breathing 30-40 times per minute but was not hypoxic was placed on 2 L for comfort.  She reports feeling this way previously when her blood levels have been low.  She is not currently on any blood thinners but denies any abdominal pain, black stools.  She states that she has not had any increased swelling in her legs that she has been taking her fluid pills and feels that her fluid status is actually very good.   Physical Exam   Triage Vital Signs: ED Triage Vitals  Enc Vitals Group     BP 10/24/22 1003 (!) 112/30     Pulse Rate 10/24/22 1003 76     Resp 10/24/22 1003 16     Temp 10/24/22 1003 98.6 F (37 C)     Temp Source 10/24/22 1003 Oral     SpO2 10/24/22 1005 100 %     Weight 10/24/22 1001 176 lb 12.9 oz (80.2 kg)     Height 10/24/22 1001  (1.499 m)     Head Circumference --      Peak Flow --      Pain Score 10/24/22 1001 5     Pain Loc --      Pain Edu? --      Excl. in GC? --     Most recent vital signs: Vitals:   10/24/22 1003 10/24/22 1005  BP: (!) 112/30   Pulse: 76   Resp: 16   Temp: 98.6 F (37 C)   SpO2:  100%     General: Awake, no distress.  CV:  Good peripheral perfusion.  Resp:  Slight increased work of breathing with clear lung  sounds bilaterally on 2 L for comfort Abd:  No distention.  There Other:  No swelling in legs.  No calf tenderness   ED Results / Procedures / Treatments   Labs (all labs ordered are listed, but only abnormal results are displayed) Labs Reviewed  SARS CORONAVIRUS 2 BY RT PCR  BLOOD GAS, VENOUS  CBC WITH DIFFERENTIAL/PLATELET  COMPREHENSIVE METABOLIC PANEL  BRAIN NATRIURETIC PEPTIDE  PROTIME-INR  APTT  TYPE AND SCREEN  TROPONIN I (HIGH SENSITIVITY)     EKG  My interpretation of EKG:  Normal sinus rate 73 without any ST elevation or T wave inversions, normal intervals  Repeat EKG is sinus rate of 75 without any ST elevation or T wave inversions except for lead III, normal intervals RADIOLOGY I have reviewed the xray personally and interpreted no evidence of any pneumonia PROCEDURES:  Critical Care performed: No  .1-3 Lead EKG Interpretation  Performed by: Concha Se, MD Authorized by: Concha Se, MD     Interpretation: normal     ECG rate:  20   ECG rate assessment: normal     Rhythm: sinus rhythm     Ectopy: none     Conduction: normal      MEDICATIONS ORDERED IN ED: Medications  acetaminophen (TYLENOL) tablet 650 mg (has no administration in time range)    Or  acetaminophen (TYLENOL) suppository 650 mg (has no administration in time range)  ondansetron (ZOFRAN) tablet 4 mg (has no administration in time range)    Or  ondansetron (ZOFRAN) injection 4 mg (has no administration in time range)  enoxaparin (LOVENOX) injection 40 mg (has no administration in time range)  senna-docusate (Senokot-S) tablet 1 tablet (has no administration in time range)  ipratropium-albuterol (DUONEB) 0.5-2.5 (3) MG/3ML nebulizer solution 3 mL (has no administration in time range)  methylPREDNISolone sodium succinate (SOLU-MEDROL) 125 mg/2 mL injection 80 mg (has no administration in time range)  azithromycin (ZITHROMAX) 500 mg in sodium chloride 0.9 % 250 mL IVPB (has no  administration in time range)  cefTRIAXone (ROCEPHIN) 2 g in sodium chloride 0.9 % 100 mL IVPB (has no administration in time range)  rosuvastatin (CRESTOR) tablet 10 mg (has no administration in time range)  iohexol (OMNIPAQUE) 350 MG/ML injection 75 mL (75 mLs Intravenous Contrast Given 10/24/22 1255)  methylPREDNISolone sodium succinate (SOLU-MEDROL) 125 mg/2 mL injection 125 mg (125 mg Intravenous Given 10/24/22 1420)  ipratropium-albuterol (DUONEB) 0.5-2.5 (3) MG/3ML nebulizer solution 3 mL (3 mLs Nebulization Given 10/24/22 1420)  ipratropium-albuterol (DUONEB) 0.5-2.5 (3) MG/3ML nebulizer solution 3 mL (3 mLs Nebulization Given 10/24/22 1422)  cefTRIAXone (ROCEPHIN) 2 g in sodium chloride 0.9 % 100 mL IVPB (0 g Intravenous Stopped 10/24/22 1617)  azithromycin (ZITHROMAX) tablet 500 mg (500 mg Oral Given 10/24/22 1420)     IMPRESSION / MDM / ASSESSMENT AND PLAN / ED COURSE  I reviewed the triage vital signs and the nursing notes.   Patient's presentation is most consistent with acute presentation with potential threat to life or bodily function.   Differential includes anemia, pneumonia, COVID, CHF, ACS, consider PE if workup is otherwise unrevealing but patient does have a IVC filter in  Patient attempted to ambulate again and gets very short of breath increased work of breathing CT imaging concerning for possible bronchiolitis did not hear any obvious wheezing will trial some DuoNebs, steroids may be a potential pneumonia will cover with antibiotics.  At this point I do not feel like it is safe to discharge her home given her difficult exertional shortness of breath so we will discuss with hospital team for admission  Hemoglobin stable  CMP re-assuring UA possible UTI will send for culture VBG no hyper cap   The patient is on the cardiac monitor to evaluate for evidence of arrhythmia and/or significant heart rate changes.      FINAL CLINICAL IMPRESSION(S) / ED DIAGNOSES   Final  diagnoses:  Bronchiolitis     Rx / DC Orders   ED Discharge Orders     None        Note:  This document was prepared using Dragon voice recognition software and may include unintentional dictation errors.   Concha Se, MD 10/24/22 [phone]

## 2022-10-24 NOTE — Assessment & Plan Note (Signed)
Not in acute exacerbation at this time. ?

## 2022-10-24 NOTE — Telephone Encounter (Signed)
Thanks. Will just keep appt in June as scheduled. No need to r/s

## 2022-10-24 NOTE — Assessment & Plan Note (Signed)
This meets criteria for morbid obesity based on the presence of 1 or more chronic comorbidities. Patient has 35.7 with copd, hld. This complicates overall care and prognosis.

## 2022-10-24 NOTE — Assessment & Plan Note (Signed)
Iron supplements initiated PO BID with meals

## 2022-10-24 NOTE — H&P (Signed)
History and Physical   Courtney Mack:[id number] DOB: 02/16/1956 DOA: 10/24/2022  PCP: Mack, Courtney Real [facility]  Outpatient Specialists: Dr. Cathie Hoops, medical oncology/hematology Patient coming from: Home  I have personally briefly reviewed patient's old medical records in [facility] EMR.  Chief Concern: Shortness of breath and chest pain  HPI: [name] is a 67 year old female with history of GERD, hyperlipidemia, iron deficiency anemia, who presents to emergency department for chief concerns of shortness of breath.  Vitals show temperature of 98.6, respiration rate of 22, heart rate of 68, blood pressure 114/60, SpO2 100% on 2 L nasal cannula.  Serum sodium is 137, potassium 3.2, chloride 1022, bicarb 23, BUN of 23, serum creatinine 0.99, EGFR greater than 60, nonfasting blood glucose 171, WBC 6.5, hemoglobin 8.1, platelets of 237.  High sensitive troponin is 10 and on repeat was 10.  BNP was 54.1.  ED treatment: Azithromycin 500 mg p.o. one-time dose, ceftriaxone 2 g IV, DuoNebs 3 treatments, Solu-Medrol 125 mg IV one-time dose. ----------------------------- At bedside, she was able to tell me her name, age, current calendar year of 23, and current location.   She reports last night she woke up to go the restroom and and during the walking to and from bathroom, she had shortness of breath. She then went back to bed and played on her tablet and then went to sleep around 2 am. She reports that during this AM, she was up and showering and the felt the shortness of breath.   She reports the last time she felt this way, she had blood clots in her lungs or when the had low blood.   She denies new cough.   She reports new chest pain that started this morning when she woke. She states the chest pain is worse with movement. She described the pain has sharp and burning. She denies arm pain.  She endorses dysuria that started about 1 week ago. She reports her last  BM was yesterday and it was brown.   Social history: She lives with her daughter and grandchildren.  She denies history of tobacco, etoh, and recreational drug use.   ROS: Constitutional: no weight change, no fever ENT/Mouth: no sore throat, no rhinorrhea Eyes: no eye pain, no vision changes Cardiovascular: + chest pain, + dyspnea,  no edema, no palpitations Respiratory: - cough, no sputum, no wheezing Gastrointestinal: no nausea, no vomiting, no diarrhea, no constipation Genitourinary: no urinary incontinence, + dysuria, no hematuria Musculoskeletal: no arthralgias, no myalgias Skin: no skin lesions, no pruritus, Neuro: + weakness, no loss of consciousness, no syncope Psych: no anxiety, no depression, no decrease appetite Heme/Lymph: no bruising, no bleeding  ED Course: Discussed with him to see medicine provider, patient requiring hospitalization for chief concerns of shortness of breath and chest pain.  Assessment/Plan  Principal Problem:   COPD exacerbation Active Problems:   Acute on chronic anemia   Dyslipidemia   Chronic diastolic CHF (congestive heart failure)   UTI (urinary tract infection)   GERD without esophagitis   COPD (chronic obstructive pulmonary disease)   Morbidly obese   Endometrial cancer   IDA (iron deficiency anemia)   HLD (hyperlipidemia)   History of pulmonary embolus (PE)   Generalized weakness   Assessment and Plan:  * COPD exacerbation With shortness of breath, etiology workup in progress at this time, differentials include pneumonitis versus viral pneumonia, versus community-acquired pneumonia Check procalcitonin Status post azithromycin 500 mg p.o. one-time dose, ceftriaxone 2 g IV  one-time dose, Solu-Medrol 125 mg IV one-time dose per EDP IV antibiotics continued on admission to complete 5-day course DuoNebs, 3 doses ordered as scheduled on admission Solu-Medrol 80 mg IV one-time dose ordered for 10/25/2022 Admit to telemetry medical,  inpatient  UTI (urinary tract infection) POA Ceftriaxone  Chronic diastolic CHF (congestive heart failure) Not in acute exacerbation at this time  Morbidly obese This meets criteria for morbid obesity based on the presence of 1 or more chronic comorbidities. Patient has 35.7 with copd, hld. This complicates overall care and prognosis.  IDA (iron deficiency anemia) Iron supplements initiated PO BID with meals  History of pulmonary embolus (PE) History of PE in June 2022, DVT February 2022 Patient is status post IVC filter in February 2022 and has been taken off of Xarelto due to recurrent GI bleed  Chart reviewed.   Complete echo on 02/25/2022: read as estimated ejection fraction 65 to 70%. Grade 1 diastolic dysfunction, impaired relaxation.  DVT prophylaxis: Enoxaparin Code Status: full code Diet: Heart healthy Family Communication: a phone call was offered, patient declined stating that daughter already knows Disposition Plan: pending clinical course  Consults called: no Admission status: Telemetry medical, patient  Past Medical History:  Diagnosis Date   Acute bilateral deep vein thrombosis (DVT) of femoral veins 01/02/2021   Acute massive pulmonary embolism 08/25/2020   Last Assessment & Plan:  Formatting of this note might be different from the original. Post vena caval filter and on xarelto and O2   Anemia    ARF (acute respiratory failure)    Cellulitis of left lower leg 06/20/2021   CHF (congestive heart failure)    COVID-19    Diabetes mellitus without complication    DVT (deep venous thrombosis) 09/06/2020   Endometrial cancer    GAVE (gastric antral vascular ectasia)    GERD (gastroesophageal reflux disease)    High cholesterol    Hx of blood clots    Hyperlipidemia    Hypertension    IDA (iron deficiency anemia) 09/21/2020   Obesity    Pneumonia 06/11/2022   recovered   Pressure injury of skin 06/21/2021   Pulmonary embolism    Sepsis     Thrombocytopenia    Urinary tract infection 09/13/2021   Past Surgical History:  Procedure Laterality Date   CATARACT EXTRACTION W/PHACO Right 06/04/2022   Procedure: CATARACT EXTRACTION PHACO AND INTRAOCULAR LENS PLACEMENT (IOC) COMPLICATED RIGHT  31.83  02:17.4;  Surgeon: Galen Manila, MD;  Location: [facility];  Service: Ophthalmology;  Laterality: Right;   CATARACT EXTRACTION W/PHACO Left 07/09/2022   Procedure: CATARACT EXTRACTION PHACO AND INTRAOCULAR LENS PLACEMENT (IOC);  Surgeon: Galen Manila, MD;  Location: [facility];  Service: Ophthalmology;  Laterality: Left;  22.75 1:40.3   COLONOSCOPY N/A 06/21/2021   Procedure: COLONOSCOPY;  Surgeon: Toledo, Boykin Nearing, MD;  Location: [facility];  Service: Gastroenterology;  Laterality: N/A;   CYSTOSCOPY W/ URETERAL STENT PLACEMENT Left 09/11/2021   Procedure: CYSTOSCOPY WITH RETROGRADE PYELOGRAM/URETERAL STENT PLACEMENT;  Surgeon: Riki Altes, MD;  Location: [facility];  Service: Urology;  Laterality: Left;   CYSTOSCOPY W/ URETERAL STENT PLACEMENT Left 11/17/2021   Procedure: CYSTOSCOPY WITH STENT REPLACEMENT;  Surgeon: Bjorn Pippin, MD;  Location: [facility];  Service: Urology;  Laterality: Left;   CYSTOSCOPY WITH FULGERATION  11/17/2021   Procedure: CYSTOSCOPY WITH FULGERATION;  Surgeon: Bjorn Pippin, MD;  Location: [facility];  Service: Urology;;   CYSTOSCOPY WITH STENT PLACEMENT Left 10/17/2021   Procedure: CYSTOSCOPY WITH STENT  PLACEMENT;  Surgeon: Riki Altes, MD;  Location: [facility];  Service: Urology;  Laterality: Left;   CYSTOSCOPY WITH URETEROSCOPY, STONE BASKETRY AND STENT PLACEMENT Left 10/16/2021   Procedure: CYSTOSCOPY WITH URETEROSCOPY  AND STENT REMOVAL;  Surgeon: Riki Altes, MD;  Location: [facility];  Service: Urology;  Laterality: Left;   ENTEROSCOPY N/A 08/17/2021   Procedure: ENTEROSCOPY;  Surgeon: Toney Reil, MD;  Location: [facility];  Service: Gastroenterology;  Laterality: N/A;    ESOPHAGOGASTRODUODENOSCOPY N/A 06/21/2021   Procedure: ESOPHAGOGASTRODUODENOSCOPY (EGD);  Surgeon: Toledo, Boykin Nearing, MD;  Location: [facility];  Service: Gastroenterology;  Laterality: N/A;   ESOPHAGOGASTRODUODENOSCOPY (EGD) WITH PROPOFOL N/A 08/17/2021   Procedure: ESOPHAGOGASTRODUODENOSCOPY (EGD) WITH PROPOFOL;  Surgeon: Toney Reil, MD;  Location: [facility];  Service: Gastroenterology;  Laterality: N/A;   ESOPHAGOGASTRODUODENOSCOPY (EGD) WITH PROPOFOL N/A 10/14/2021   Procedure: ESOPHAGOGASTRODUODENOSCOPY (EGD) WITH PROPOFOL;  Surgeon: Midge Minium, MD;  Location: [facility];  Service: Endoscopy;  Laterality: N/A;   ESOPHAGOGASTRODUODENOSCOPY (EGD) WITH PROPOFOL N/A 02/01/2022   Procedure: ESOPHAGOGASTRODUODENOSCOPY (EGD) WITH PROPOFOL;  Surgeon: Toney Reil, MD;  Location: [facility];  Service: Gastroenterology;  Laterality: N/A;   ESOPHAGOGASTRODUODENOSCOPY (EGD) WITH PROPOFOL N/A 03/15/2022   Procedure: ESOPHAGOGASTRODUODENOSCOPY (EGD) WITH PROPOFOL;  Surgeon: Wyline Mood, MD;  Location: [facility] END[facility]COPY;  Service: Gastroenterology;  Laterality: N/A;   ESOPHAGOGASTRODUODENOSCOPY (EGD) WITH PROPOFOL N/A 04/18/2022   Procedure: ESOPHAGOGASTRODUODENOSCOPY (EGD) WITH PROPOFOL;  Surgeon: Toney Reil, MD;  Location: [facility];  Service: Gastroenterology;  Laterality: N/A;   ESOPHAGOGASTRODUODENOSCOPY (EGD) WITH PROPOFOL N/A 08/09/2022   Procedure: ESOPHAGOGASTRODUODENOSCOPY (EGD) WITH PROPOFOL;  Surgeon: Wyline Mood, MD;  Location: [facility];  Service: Gastroenterology;  Laterality: N/A;   GIVENS CAPSULE STUDY N/A 06/22/2021   Procedure: GIVENS CAPSULE STUDY;  Surgeon: Toledo, Boykin Nearing, MD;  Location: [facility];  Service: Gastroenterology;  Laterality: N/A;   IR NEPHROSTOMY EXCHANGE LEFT  02/25/2022   IR NEPHROSTOMY EXCHANGE LEFT  03/27/2022   IR NEPHROSTOMY EXCHANGE LEFT  05/22/2022   IR NEPHROSTOMY EXCHANGE LEFT  07/24/2022   IR NEPHROSTOMY  EXCHANGE LEFT  09/24/2022   IR NEPHROSTOMY PLACEMENT LEFT  12/27/2021   IVC FILTER INSERTION N/A 08/18/2020   Procedure: IVC FILTER INSERTION;  Surgeon: Renford Dills, MD;  Location: [facility];  Service: Cardiovascular;  Laterality: N/A;   PERIPHERAL VASCULAR THROMBECTOMY Bilateral 01/03/2021   Procedure: PERIPHERAL VASCULAR THROMBECTOMY;  Surgeon: Annice Needy, MD;  Location: [facility];  Service: Cardiovascular;  Laterality: Bilateral;   PULMONARY THROMBECTOMY N/A 08/18/2020   Procedure: PULMONARY THROMBECTOMY;  Surgeon: Renford Dills, MD;  Location: [facility];  Service: Cardiovascular;  Laterality: N/A;   TUBAL LIGATION     VISCERAL ANGIOGRAPHY N/A 07/18/2021   Procedure: VISCERAL ANGIOGRAPHY;  Surgeon: Annice Needy, MD;  Location: [facility];  Service: Cardiovascular;  Laterality: N/A;   Social History:  reports that she has never smoked. She has never used smokeless tobacco. She reports that she does not currently use alcohol. She reports that she does not use drugs.  No Known Allergies Family History  Problem Relation Age of Onset   Hypertension Mother    Diabetes Mother    Diabetes Father    Hypertension Father    High Cholesterol Father    Congestive Heart Failure Father    Breast cancer Cousin    Family history: Family history reviewed and not pertinent.  Prior to Admission medications   Medication Sig Start Date  End Date Taking? Authorizing Provider  acetaminophen (TYLENOL) 500 MG tablet Take 500 mg by mouth every 6 (six) hours as needed for mild pain or fever.   Yes [provider]  Ascorbic Acid (VITAMIN C) 1000 MG tablet Take 1,000 mg by mouth daily.   Yes [provider]  fluticasone furoate-vilanterol (BREO ELLIPTA) 100-25 MCG/ACT AEPB Inhale 1 puff into the lungs daily.   Yes [provider]  furosemide (LASIX) 20 MG tablet Take 1 tablet (20 mg total) by mouth daily. Home med. 10/07/22  Yes Hackney,  Inetta Fermo A, FNP  loperamide (IMODIUM) 2 MG capsule Take 2 mg by mouth once. 09/27/22  Yes [provider]  midodrine (PROAMATINE) 10 MG tablet Take 10 mg by mouth 3 (three) times daily. 08/06/22  Yes [provider]  Multiple Vitamin (MULTIVITAMIN WITH MINERALS) TABS tablet Take 1 tablet by mouth daily. 09/12/20  Yes Standley Brooking, MD  pantoprazole (PROTONIX) 40 MG tablet Take 1 tablet (40 mg total) by mouth 2 (two) times daily. 03/01/22 08/01/23 Yes Sreenath, Sudheer B, MD  polyethylene glycol (MIRALAX / GLYCOLAX) 17 g packet Take 17 g by mouth daily as needed for mild constipation. 03/29/22  Yes Rolly Salter, MD  rosuvastatin (CRESTOR) 10 MG tablet Take 1 tablet (10 mg total) by mouth daily. 10/03/20  Yes Rickard Patience, MD  Difluprednate 0.05 % EMUL Place 1 drop into the right eye 2 (two) times daily. Patient not taking: Reported on 10/24/2022 05/31/22   [provider]  FEROSUL 325 (65 Fe) MG tablet Take 325 mg by mouth daily with breakfast. Patient not taking: Reported on 10/24/2022 08/14/22   [provider]  ketorolac (ACULAR) 0.5 % ophthalmic solution Place 1 drop into the right eye daily. Patient not taking: Reported on 10/24/2022 05/29/22   [provider]  moxifloxacin (VIGAMOX) 0.5 % ophthalmic solution Place 1 drop into the right eye daily. Patient not taking: Reported on 10/24/2022 05/29/22   [provider]  sucralfate (CARAFATE) 1 g tablet Take 1 tablet (1 g total) by mouth 4 (four) times daily. 08/10/22 09/21/22  Lurene Shadow, MD   Physical Exam: Vitals:   10/24/22 1500 10/24/22 1530 10/24/22 1730 10/24/22 1802  BP: (!) 121/51 (!) 117/48 (!) 110/54   Pulse: 82 87 85   Resp: (!) 26 (!) 24 (!) 24   Temp:    97.9 F (36.6 C)  TempSrc:    Oral  SpO2: 100% 99% 92%   Weight:      Height:       Constitutional: appears age-appropriate, frail, NAD, calm Eyes: PERRL, lids and conjunctivae normal ENMT: Mucous membranes are moist. Posterior  pharynx clear of any exudate or lesions. Age-appropriate dentition. Hearing appropriate Neck: normal, supple, no masses, no thyromegaly Respiratory: Generalized decreased lung sounds bilaterally, mild expiratory wheezing, no crackles. Normal respiratory effort. No accessory muscle use.  Cardiovascular: Regular rate and rhythm, no murmurs / rubs / gallops. No extremity edema. 2+ pedal pulses. No carotid bruits.  Abdomen: Obese abdomen, no tenderness, no masses palpated, no hepatosplenomegaly. Bowel sounds positive.  Musculoskeletal: no clubbing / cyanosis. No joint deformity upper and lower extremities. Good ROM, no contractures, no atrophy. Normal muscle tone.  Skin: no rashes, lesions, ulcers. No induration Neurologic: Sensation intact. Strength 5/5 in all 4.  Psychiatric: Normal judgment and insight. Alert and oriented x 3. Normal mood.   EKG: independently reviewed, showing sinus rhythm with rate of 75, QTc 446  Chest x-ray on Admission: I  personally reviewed and I agree with radiologist reading as below.  CT Angio Chest PE W and/or Wo Contrast  Result Date: 10/24/2022 CLINICAL DATA:  Code sepsis. EXAM: CT ANGIOGRAPHY CHEST WITH CONTRAST TECHNIQUE: Multidetector CT imaging of the chest was performed using the standard protocol during bolus administration of intravenous contrast. Multiplanar CT image reconstructions and MIPs were obtained to evaluate the vascular anatomy. RADIATION DOSE REDUCTION: This exam was performed according to the departmental dose-optimization program which includes automated exposure control, adjustment of the mA and/or kV according to patient size and/or use of iterative reconstruction technique. CONTRAST:  75mL OMNIPAQUE IOHEXOL 350 MG/ML SOLN COMPARISON:  CT scan 02/24/2022 FINDINGS: Cardiovascular: The heart is normal in size. No pericardial effusion. The aorta is normal in caliber. Stable scattered atherosclerotic calcifications. No dissection. The branch vessels are  patent. Scattered coronary artery calcifications. The pulmonary arterial tree is well opacified. No filling defects to suggest pulmonary embolism. Mediastinum/Nodes: No mediastinal or hilar mass or lymphadenopathy. The esophagus is grossly normal. The thyroid gland is unremarkable. Lungs/Pleura: Patchy mosaic pattern of ground-glass attenuation most likely due to reactive airways disease such as asthma or constrictive bronchiolitis. Other possibilities would include cryptogenic organizing pneumonia or hypersensitivity pneumonitis. No focal pulmonary infiltrates or pleural effusions. Streaky areas of bibasilar subsegmental atelectasis. Upper Abdomen: No significant upper abdominal findings. Musculoskeletal: No significant bony findings. Review of the MIP images confirms the above findings. IMPRESSION: 1. No CT findings for pulmonary embolism. 2. Stable scattered atherosclerotic calcifications involving the aorta and coronary arteries. 3. Patchy mosaic pattern of ground-glass attenuation most likely due to reactive airways disease such as asthma or constrictive bronchiolitis. Other possibilities would include cryptogenic organizing pneumonia or hypersensitivity pneumonitis. 4. Streaky subsegmental bibasilar atelectasis. Electronically Signed   By: Rudie Meyer M.D.   On: 10/24/2022 13:59   DG Chest Portable 1 View  Result Date: 10/24/2022 CLINICAL DATA:  Shortness of breath EXAM: PORTABLE CHEST 1 VIEW COMPARISON:  Chest x-ray dated August 07, 2022 FINDINGS: The heart size and mediastinal contours are within normal limits. Both lungs are clear. The visualized skeletal structures are unremarkable. IMPRESSION: No active disease. Electronically Signed   By: Allegra Lai M.D.   On: 10/24/2022 12:16    Labs on Admission: I have personally reviewed following labs  CBC: Recent Labs  Lab 10/24/22 1010  WBC 6.5  NEUTROABS 5.1  HGB 8.1*  HCT 27.7*  MCV 88.5  PLT 237   Basic Metabolic Panel: Recent Labs   Lab 10/24/22 1010  NA 137  K 3.2*  CL 102  CO2 23  GLUCOSE 171*  BUN 23  CREATININE 0.99  CALCIUM 8.9   GFR: Estimated Creatinine Clearance: 50.5 mL/min (by C-G formula based on SCr of 0.99 mg/dL).  Liver Function Tests: Recent Labs  Lab 10/24/22 1010  AST 21  ALT 9  ALKPHOS 48  BILITOT 1.2  PROT 7.1  ALBUMIN 3.3*   Coagulation Profile: Recent Labs  Lab 10/24/22 1010  INR 1.1   Urine analysis:    Component Value Date/Time   COLORURINE YELLOW (A) 10/24/2022 1239   APPEARANCEUR HAZY (A) 10/24/2022 1239   APPEARANCEUR Cloudy (A) 01/03/2022 1443   LABSPEC 1.023 10/24/2022 1239   PHURINE 6.0 10/24/2022 1239   GLUCOSEU NEGATIVE 10/24/2022 1239   HGBUR NEGATIVE 10/24/2022 1239   BILIRUBINUR NEGATIVE 10/24/2022 1239   BILIRUBINUR Negative 01/03/2022 1443   KETONESUR NEGATIVE 10/24/2022 1239   PROTEINUR NEGATIVE 10/24/2022 1239   NITRITE NEGATIVE 10/24/2022 1239   LEUKOCYTESUR MODERATE (  A) 10/24/2022 1239   This document was prepared using Dragon Voice Recognition software and may include unintentional dictation errors.  Dr. Sedalia Muta [facility]  If 7PM-7AM, please contact overnight-coverage provider If 7AM-7PM, please contact day attending provider [email or web address]  10/24/2022, 8:34 PM

## 2022-10-24 NOTE — Telephone Encounter (Signed)
pt called to notify us that she is currently hospitalized. Missed appt for today.

## 2022-10-24 NOTE — Assessment & Plan Note (Signed)
History of PE in June 2022, DVT February 2022 Patient is status post IVC filter in February 2022 and has been taken off of Xarelto due to recurrent GI bleed

## 2022-10-24 NOTE — ED Notes (Signed)
Pt stood up to use bedside commode and became extremely SOB. VS WNL. Assisted back to bed. EKG done as pt said she had slight chest pain. MD notified.   200 mL emptied from nephrostomy

## 2022-10-24 NOTE — ED Triage Notes (Signed)
Pt via [facility] from home. Per EMS, pt's home smells like urine. Pt has a strong odor smell. Pt c/o SOB and chest pain, states it started last night but this AM the SOB was worse. On EMS arrival, pt breathing approx 30-40 a min, pt O2 sat 100% on RA but placed on 2L [place] for comfort. Pt has hx of CHF and kidney disease. Pt has a nephrostomy on the L side. Pt is A&OX4 and NAD

## 2022-10-24 NOTE — Assessment & Plan Note (Addendum)
With shortness of breath, etiology workup in progress at this time, differentials include pneumonitis versus viral pneumonia, versus community-acquired pneumonia Check procalcitonin Status post azithromycin 500 mg p.o. one-time dose, ceftriaxone 2 g IV one-time dose, Solu-Medrol 125 mg IV one-time dose per EDP IV antibiotics continued on admission to complete 5-day course DuoNebs, 3 doses ordered as scheduled on admission Solu-Medrol 80 mg IV one-time dose ordered for 10/25/2022 Admit to telemetry medical, inpatient

## 2022-10-24 NOTE — Assessment & Plan Note (Signed)
POA Ceftriaxone

## 2022-10-25 DIAGNOSIS — I5032 Chronic diastolic (congestive) heart failure: Secondary | ICD-10-CM | POA: Diagnosis not present

## 2022-10-25 DIAGNOSIS — N342 Other urethritis: Secondary | ICD-10-CM

## 2022-10-25 DIAGNOSIS — D500 Iron deficiency anemia secondary to blood loss (chronic): Secondary | ICD-10-CM

## 2022-10-25 DIAGNOSIS — E876 Hypokalemia: Secondary | ICD-10-CM | POA: Insufficient documentation

## 2022-10-25 DIAGNOSIS — J441 Chronic obstructive pulmonary disease with (acute) exacerbation: Secondary | ICD-10-CM | POA: Diagnosis not present

## 2022-10-25 LAB — MAGNESIUM: Magnesium: 2 mg/dL (ref 1.7–2.4)

## 2022-10-25 LAB — CBC
HCT: 25 % — ABNORMAL LOW (ref 36.0–46.0)
Hemoglobin: 7.6 g/dL — ABNORMAL LOW (ref 12.0–15.0)
MCH: 26.3 pg (ref 26.0–34.0)
MCHC: 30.4 g/dL (ref 30.0–36.0)
MCV: 86.5 fL (ref 80.0–100.0)
Platelets: 185 10*3/uL (ref 150–400)
RBC: 2.89 MIL/uL — ABNORMAL LOW (ref 3.87–5.11)
RDW: 19.6 % — ABNORMAL HIGH (ref 11.5–15.5)
WBC: 3.2 10*3/uL — ABNORMAL LOW (ref 4.0–10.5)
nRBC: 0 % (ref 0.0–0.2)

## 2022-10-25 LAB — PROCALCITONIN: Procalcitonin: 0.14 ng/mL

## 2022-10-25 LAB — BASIC METABOLIC PANEL
Anion gap: 9 (ref 5–15)
BUN: 24 mg/dL — ABNORMAL HIGH (ref 8–23)
CO2: 25 mmol/L (ref 22–32)
Calcium: 9.1 mg/dL (ref 8.9–10.3)
Chloride: 103 mmol/L (ref 98–111)
Creatinine, Ser: 0.88 mg/dL (ref 0.44–1.00)
GFR, Estimated: >60 mL/min (ref >60)
Glucose, Bld: 203 mg/dL — ABNORMAL HIGH (ref 70–99)
Potassium: 3.7 mmol/L (ref 3.5–5.1)
Sodium: 137 mmol/L (ref 135–145)

## 2022-10-25 LAB — URINE CULTURE

## 2022-10-25 MED ORDER — SODIUM CHLORIDE 0.9 % IV SOLN
300.0000 mg | Freq: Once | INTRAVENOUS | Status: AC
  Administered 2022-10-25: 300 mg via INTRAVENOUS
  Filled 2022-10-25: qty 300

## 2022-10-25 MED ORDER — AZITHROMYCIN 250 MG PO TABS
500.0000 mg | ORAL_TABLET | Freq: Every day | ORAL | Status: DC
  Administered 2022-10-26: 500 mg via ORAL
  Filled 2022-10-25: qty 2

## 2022-10-25 MED ORDER — IPRATROPIUM-ALBUTEROL 0.5-2.5 (3) MG/3ML IN SOLN: 3.0000 mL | RESPIRATORY_TRACT | Status: DC | PRN

## 2022-10-25 MED ORDER — CEPHALEXIN 500 MG PO CAPS
500.0000 mg | ORAL_CAPSULE | Freq: Three times a day (TID) | ORAL | Status: DC
  Administered 2022-10-26 (×2): 500 mg via ORAL
  Filled 2022-10-25 (×2): qty 1

## 2022-10-25 MED ORDER — FLUTICASONE FUROATE-VILANTEROL 100-25 MCG/ACT IN AEPB: 1.0000 | INHALATION_SPRAY | Freq: Every day | RESPIRATORY_TRACT | Status: DC

## 2022-10-25 MED ORDER — FOSFOMYCIN TROMETHAMINE 3 G PO PACK: 3.0000 g | PACK | Freq: Once | ORAL | Status: DC

## 2022-10-25 MED ORDER — PREDNISONE 20 MG PO TABS
40.0000 mg | ORAL_TABLET | Freq: Every day | ORAL | Status: DC
  Administered 2022-10-26: 40 mg via ORAL
  Filled 2022-10-25: qty 2

## 2022-10-25 NOTE — Progress Notes (Signed)
We really try to reserve Fosfomycin for when there are not other options for patients. Recommended Ceftriaxone 1g x 3 does or PO keflex instead. OK per MD

## 2022-10-25 NOTE — Care Management CC44 (Signed)
Condition Code 44 Documentation Completed  Patient Details  Name: ABEL RA MRN: [id number] Date of Birth: 1956-02-09   Condition Code 44 given:  Yes Patient signature on Condition Code 44 notice:  Yes Documentation of 2 MD's agreement:  Yes Code 44 added to claim:  Yes    Darleene Cleaver, LCSW 10/25/2022, 3:12 PM

## 2022-10-25 NOTE — Progress Notes (Addendum)
  Progress Note   Patient: Courtney Mack:[id number] DOB: 1956-06-29 DOA: 10/24/2022     1 DOS: the patient was seen and examined on 10/25/2022   Brief hospital course: [name] is a 67 year old female with history of GERD, hyperlipidemia, iron deficiency anemia, who presents to emergency department for chief concerns of shortness of breath. She is diagnosed with COPD exacerbation, was given steroids, antibiotics.  Procalcitonin level not elevated, discontinued Rocephin.  Continued oral Zithromycin.   Principal Problem:   COPD exacerbation Active Problems:   Acute on chronic anemia   Dyslipidemia   Chronic diastolic CHF (congestive heart failure)   UTI (urinary tract infection)   GERD without esophagitis   COPD (chronic obstructive pulmonary disease)   Morbidly obese   Endometrial cancer   IDA (iron deficiency anemia)   HLD (hyperlipidemia)   History of pulmonary embolus (PE)   Generalized weakness   Hypokalemia   Assessment and Plan: * COPD exacerbation Condition appears to be improving today, no hypoxemia.  Will continue oral prednisone and additional max.  May be able to discharge tomorrow.  Also added as needed bronchodilator.  UTI (urinary tract infection) Patient does not have sepsis.  Will give her dose of fosfomycin and complete antibiotics.  Chronic diastolic CHF (congestive heart failure) Not in acute exacerbation at this time  Morbidly obese This meets criteria for morbid obesity based on the presence of 1 or more chronic comorbidities. Patient has 35.7 with copd, hld. This complicates overall care and prognosis.  IDA (iron deficiency anemia) Patient was receiving regular IV iron, give another dose of IV iron.  Recheck a CBC tomorrow. Patient denies any black stool or rectal bleeding.  History of pulmonary embolus (PE) Not receive anticoagulation due to past GI bleed.  Bilateral buttock pressure wounds. Wound care consult.  Urostomy tube  in place.      Subjective:  Patient feels short of breath much improved.  Nonproductive cough.  No chest pain.  No black stool or rectal bleeding.  Physical Exam: Vitals:   10/25/22 0702 10/25/22 0958 10/25/22 1000 10/25/22 1030  BP: (!) 129/56  128/62 (!) 121/53  Pulse: 84  82 96  Resp: Temp:  97.8 F (36.6 C)    TempSrc:      SpO2: 100%  100% 97%  Weight:      Height:       General exam: Appears calm and comfortable, obese  Respiratory system: Decreased breathing sounds. Respiratory effort normal. Cardiovascular system: S1 & S2 heard, RRR. No JVD, murmurs, rubs, gallops or clicks. No pedal edema. Gastrointestinal system: Abdomen is nondistended, soft and nontender. No organomegaly or masses felt. Normal bowel sounds heard. Central nervous system: Alert and oriented. No focal neurological deficits. Extremities: Symmetric 5 x 5 power. Skin: No rashes, lesions or ulcers Psychiatry: Judgement and insight appear normal. Mood & affect appropriate.    Data Reviewed:  Lab results reviewed, chest x-ray reviewed.  Family Communication: None  Disposition: Status is: Inpatient Remains inpatient appropriate because: Severity of disease,     Time spent: 35 minutes  Author: Marrion Coy, MD 10/25/2022 1:36 PM  For on call review [email or web address].

## 2022-10-25 NOTE — Care Management Obs Status (Signed)
MEDICARE OBSERVATION STATUS NOTIFICATION   Patient Details  Name: Courtney Mack MRN: [id number] Date of Birth: 1955-12-20   Medicare Observation Status Notification Given:  Yes    [name] 10/25/2022, 3:12 PM

## 2022-10-26 DIAGNOSIS — N3000 Acute cystitis without hematuria: Secondary | ICD-10-CM

## 2022-10-26 DIAGNOSIS — D649 Anemia, unspecified: Secondary | ICD-10-CM | POA: Diagnosis not present

## 2022-10-26 DIAGNOSIS — I5032 Chronic diastolic (congestive) heart failure: Secondary | ICD-10-CM | POA: Diagnosis not present

## 2022-10-26 DIAGNOSIS — J441 Chronic obstructive pulmonary disease with (acute) exacerbation: Secondary | ICD-10-CM | POA: Diagnosis not present

## 2022-10-26 LAB — CBC
HCT: 25.4 % — ABNORMAL LOW (ref 36.0–46.0)
Hemoglobin: 7.6 g/dL — ABNORMAL LOW (ref 12.0–15.0)
MCH: 25.9 pg — ABNORMAL LOW (ref 26.0–34.0)
MCHC: 29.9 g/dL — ABNORMAL LOW (ref 30.0–36.0)
MCV: 86.4 fL (ref 80.0–100.0)
Platelets: 231 10*3/uL (ref 150–400)
RBC: 2.94 MIL/uL — ABNORMAL LOW (ref 3.87–5.11)
RDW: 19.5 % — ABNORMAL HIGH (ref 11.5–15.5)
WBC: 7.2 10*3/uL (ref 4.0–10.5)
nRBC: 0 % (ref 0.0–0.2)

## 2022-10-26 LAB — BASIC METABOLIC PANEL
Anion gap: 8 (ref 5–15)
BUN: 29 mg/dL — ABNORMAL HIGH (ref 8–23)
CO2: 25 mmol/L (ref 22–32)
Calcium: 9.2 mg/dL (ref 8.9–10.3)
Chloride: 105 mmol/L (ref 98–111)
Creatinine, Ser: 0.82 mg/dL (ref 0.44–1.00)
GFR, Estimated: >60 mL/min (ref >60)
Glucose, Bld: 177 mg/dL — ABNORMAL HIGH (ref 70–99)
Potassium: 4.2 mmol/L (ref 3.5–5.1)
Sodium: 138 mmol/L (ref 135–145)

## 2022-10-26 LAB — MAGNESIUM: Magnesium: 2.2 mg/dL (ref 1.7–2.4)

## 2022-10-26 MED ORDER — PREDNISONE 20 MG PO TABS: ORAL_TABLET | ORAL | 0 refills | Status: AC

## 2022-10-26 MED ORDER — CEPHALEXIN 500 MG PO CAPS: 500.0000 mg | ORAL_CAPSULE | Freq: Three times a day (TID) | ORAL | 0 refills | Status: AC

## 2022-10-26 MED ORDER — ALBUTEROL SULFATE HFA 108 (90 BASE) MCG/ACT IN AERS: 2.0000 | INHALATION_SPRAY | Freq: Four times a day (QID) | RESPIRATORY_TRACT | 0 refills | Status: AC | PRN

## 2022-10-26 NOTE — Discharge Summary (Signed)
Physician Discharge Summary   Patient: Courtney Mack MRN: [id number] DOB: 12-Feb-1956  Admit date:     10/24/2022  Discharge date: 10/26/22  Discharge Physician: Marrion Coy   [facility]P: [facility]   Recommendations at discharge:   Follow-up with PCP in 1 week. Tape the urostomy tube to the chest wall, may consider sutured to the skin at the next office visit. Check a CBC at next office visit.  Continue monthly iron infusion.  Discharge Diagnoses: Principal Problem:   COPD exacerbation Active Problems:   Acute on chronic anemia   Dyslipidemia   Chronic diastolic CHF (congestive heart failure)   UTI (urinary tract infection)   GERD without esophagitis   COPD (chronic obstructive pulmonary disease)   Morbidly obese   Endometrial cancer   IDA (iron deficiency anemia)   HLD (hyperlipidemia)   History of pulmonary embolus (PE)   Generalized weakness   Hypokalemia Obesity with BMI 35.71. Resolved Problems:   * No resolved hospital problems. [facility] Course: Courtney Mack is a 67 year old female with history of GERD, hyperlipidemia, iron deficiency anemia, who presents to emergency department for chief concerns of shortness of breath. She is diagnosed with COPD exacerbation, was given steroids, antibiotics.  Procalcitonin level not elevated, patient treated with Zithromax, IV steroids.  Also treated with Rocephin and changed to Keflex for urinary tract infection. Condition had improved, currently medically stable to be discharged.  Assessment and Plan: COPD exacerbation Condition appears to be improving today, no hypoxemia.  Will continue oral prednisone and zithromax.  Patient condition has improved, medically stable to be discharged.  Will continue a few days steroid taper.  UTI (urinary tract infection) Patient does not have sepsis.  Patient is dated with Rocephin, changed to Keflex.  Not able to give fosfomycin due to shortage.  Urine  culture showed multiple species.  Chronic diastolic CHF (congestive heart failure) Not in acute exacerbation at this time   Morbidly obese This meets criteria for morbid obesity based on the presence of 1 or more chronic comorbidities. Patient has 35.7 with copd, hld. This complicates overall care and prognosis.   IDA (iron deficiency anemia) Patient was receiving regular IV iron, give another dose of IV iron.  Hemoglobin still low but stable. Patient denies not have any black stool or rectal bleeding.   History of pulmonary embolus (PE) Not receive anticoagulation due to past GI bleed.   Bilateral buttock pressure wounds. Wound care consult.   Urostomy tube in place.  The suture fell off, currently tapered to the skin.  May consider suture back at the next office visit.  Decubital ulcers ruled out. Nursing notified me that there is some changes to inpatient sacrum.  I personally examed her today, there is no decubitus ulcer present.      Consultants: None Procedures performed: None  Disposition: Home Diet recommendation:  Discharge Diet Orders (From admission, onward)     Start     Ordered   10/26/22 0000  Diet - low sodium heart healthy        10/26/22 1231           Cardiac diet DISCHARGE MEDICATION: Allergies as of 10/26/2022   No Known Allergies      Medication List     STOP taking these medications    Difluprednate 0.05 % Emul   ketorolac 0.5 % ophthalmic solution Commonly known as: ACULAR   midodrine 10 MG tablet Commonly known as: PROAMATINE   moxifloxacin  0.5 % ophthalmic solution Commonly known as: VIGAMOX       TAKE these medications    acetaminophen 500 MG tablet Commonly known as: TYLENOL Take 500 mg by mouth every 6 (six) hours as needed for mild pain or fever.   albuterol 108 (90 Base) MCG/ACT inhaler Commonly known as: VENTOLIN HFA Inhale 2 puffs into the lungs every 6 (six) hours as needed for wheezing or shortness of  breath.   cephALEXin 500 MG capsule Commonly known as: KEFLEX Take 1 capsule (500 mg total) by mouth every 8 (eight) hours for 3 days.   FeroSul 325 (65 FE) MG tablet Generic drug: ferrous sulfate Take 325 mg by mouth daily with breakfast.   fluticasone furoate-vilanterol 100-25 MCG/ACT Aepb Commonly known as: BREO ELLIPTA Inhale 1 puff into the lungs daily.   furosemide 20 MG tablet Commonly known as: Lasix Take 1 tablet (20 mg total) by mouth daily. Home med.   loperamide 2 MG capsule Commonly known as: IMODIUM Take 2 mg by mouth once.   multivitamin with minerals Tabs tablet Take 1 tablet by mouth daily.   pantoprazole 40 MG tablet Commonly known as: PROTONIX Take 1 tablet (40 mg total) by mouth 2 (two) times daily.   polyethylene glycol 17 g packet Commonly known as: MIRALAX / GLYCOLAX Take 17 g by mouth daily as needed for mild constipation.   predniSONE 20 MG tablet Commonly known as: DELTASONE Take 1 tablet (20 mg total) by mouth daily with breakfast for 3 days, THEN 0.5 tablets (10 mg total) daily with breakfast for 3 days. Start taking on: October 27, 2022   rosuvastatin 10 MG tablet Commonly known as: CRESTOR Take 1 tablet (10 mg total) by mouth daily.   sucralfate 1 g tablet Commonly known as: Carafate Take 1 tablet (1 g total) by mouth 4 (four) times daily.   vitamin C 1000 MG tablet Take 1,000 mg by mouth daily.        Follow-up Information     [facility] Follow up in 1 week(s).   Specialty: General Practice Contact information: [address] [place] [address] [phone]                Discharge Exam: Filed Weights   10/24/22 1001  Weight: 80.2 kg   General exam: Appears calm and comfortable  Respiratory system: Decreased breathing sounds. Respiratory effort normal. Cardiovascular system: S1 & S2 heard, RRR. No JVD, murmurs, rubs, gallops or clicks. No pedal edema. Gastrointestinal  system: Abdomen is nondistended, soft and nontender. No organomegaly or masses felt. Normal bowel sounds heard. Central nervous system: Alert and oriented. No focal neurological deficits. Extremities: Symmetric 5 x 5 power. Skin: No rashes, lesions or ulcers Psychiatry: Judgement and insight appear normal. Mood & affect appropriate.    Condition at discharge: fair  The results of significant diagnostics from this hospitalization (including imaging, microbiology, ancillary and laboratory) are listed below for reference.   Imaging Studies: CT Angio Chest PE W and/or Wo Contrast  Result Date: 10/24/2022 CLINICAL DATA:  Code sepsis. EXAM: CT ANGIOGRAPHY CHEST WITH CONTRAST TECHNIQUE: Multidetector CT imaging of the chest was performed using the standard protocol during bolus administration of intravenous contrast. Multiplanar CT image reconstructions and MIPs were obtained to evaluate the vascular anatomy. RADIATION DOSE REDUCTION: This exam was performed according to the departmental dose-optimization program which includes automated exposure control, adjustment of the mA and/or kV according to patient size and/or use of iterative  reconstruction technique. CONTRAST:  75mL OMNIPAQUE IOHEXOL 350 MG/ML SOLN COMPARISON:  CT scan 02/24/2022 FINDINGS: Cardiovascular: The heart is normal in size. No pericardial effusion. The aorta is normal in caliber. Stable scattered atherosclerotic calcifications. No dissection. The branch vessels are patent. Scattered coronary artery calcifications. The pulmonary arterial tree is well opacified. No filling defects to suggest pulmonary embolism. Mediastinum/Nodes: No mediastinal or hilar mass or lymphadenopathy. The esophagus is grossly normal. The thyroid gland is unremarkable. Lungs/Pleura: Patchy mosaic pattern of ground-glass attenuation most likely due to reactive airways disease such as asthma or constrictive bronchiolitis. Other possibilities would include cryptogenic  organizing pneumonia or hypersensitivity pneumonitis. No focal pulmonary infiltrates or pleural effusions. Streaky areas of bibasilar subsegmental atelectasis. Upper Abdomen: No significant upper abdominal findings. Musculoskeletal: No significant bony findings. Review of the MIP images confirms the above findings. IMPRESSION: 1. No CT findings for pulmonary embolism. 2. Stable scattered atherosclerotic calcifications involving the aorta and coronary arteries. 3. Patchy mosaic pattern of ground-glass attenuation most likely due to reactive airways disease such as asthma or constrictive bronchiolitis. Other possibilities would include cryptogenic organizing pneumonia or hypersensitivity pneumonitis. 4. Streaky subsegmental bibasilar atelectasis. Electronically Signed   By: Rudie Meyer M.D.   On: 10/24/2022 13:59   DG Chest Portable 1 View  Result Date: 10/24/2022 CLINICAL DATA:  Shortness of breath EXAM: PORTABLE CHEST 1 VIEW COMPARISON:  Chest x-ray dated August 07, 2022 FINDINGS: The heart size and mediastinal contours are within normal limits. Both lungs are clear. The visualized skeletal structures are unremarkable. IMPRESSION: No active disease. Electronically Signed   By: Allegra Lai M.D.   On: 10/24/2022 12:16    Microbiology: Results for orders placed or performed during the hospital encounter of 10/24/22  SARS Coronavirus 2 by RT PCR (hospital order, performed in [facility] hospital lab) *cepheid single result test* Anterior Nasal Swab     Status: None   Collection Time: 10/24/22 10:25 AM   Specimen: Anterior Nasal Swab  Result Value Ref Range Status   SARS Coronavirus 2 by RT PCR NEGATIVE NEGATIVE Final    Comment: (NOTE) SARS-CoV-2 target nucleic acids are NOT DETECTED.  The SARS-CoV-2 RNA is generally detectable in upper and lower respiratory specimens during the acute phase of infection. The lowest concentration of SARS-CoV-2 viral copies this assay can detect is 250 copies /  mL. A negative result does not preclude SARS-CoV-2 infection and should not be used as the sole basis for treatment or other patient management decisions.  A negative result may occur with improper specimen collection / handling, submission of specimen other than nasopharyngeal swab, presence of viral mutation(s) within the areas targeted by this assay, and inadequate number of viral copies (<250 copies / mL). A negative result must be combined with clinical observations, patient history, and epidemiological information.  Fact Sheet for Patients:   RoadLapTop.co.za  Fact Sheet for Healthcare Providers: http://kim-miller.com/  This test is not yet approved or  cleared by the [place] FDA and has been authorized for detection and/or diagnosis of SARS-CoV-2 by FDA under an Emergency Use Authorization (EUA).  This EUA will remain in effect (meaning this test can be used) for the duration of the COVID-19 declaration under Section 564(b)(1) of the Act, 21 U.S.C. section 360bbb-3(b)(1), unless the authorization is terminated or revoked sooner.  Performed at [facility], [address], [place], [place] [address]   Urine Culture     Status: Abnormal   Collection Time: 10/24/22 12:39 PM   Specimen:  Urine, Random  Result Value Ref Range Status   Specimen Description   Final    URINE, RANDOM Performed at [facility], [address] [place], [place] [address]    Special Requests   Final    NONE Performed at [facility], [address] [place], [place] [address]    Culture MULTIPLE SPECIES PRESENT, SUGGEST RECOLLECTION (A)  Final   Report Status 10/25/2022 FINAL  Final    Labs: CBC: Recent Labs  Lab 10/24/22 1010 10/25/22 0633 10/26/22 0448  WBC 6.5 3.2* 7.2  NEUTROABS 5.1  --   --   HGB 8.1* 7.6* 7.6*  HCT 27.7* 25.0* 25.4*  MCV 88.5 86.5 86.4  PLT 237 185 231   Basic Metabolic  Panel: Recent Labs  Lab 10/24/22 0633 10/24/22 1010 10/25/22 0633 10/26/22 0448  NA  --  137 137 138  K  --  3.2* 3.7 4.2  CL  --  102 103 105  CO2  --  23 25 25   GLUCOSE  --  171* 203* 177*  BUN  --  23 24* 29*  CREATININE  --  0.99 0.88 0.82  CALCIUM  --  8.9 9.1 9.2  MG 2.0  --   --  2.2   Liver Function Tests: Recent Labs  Lab 10/24/22 1010  AST 21  ALT 9  ALKPHOS 48  BILITOT 1.2  PROT 7.1  ALBUMIN 3.3*   CBG: No results for input(s): "GLUCAP" in the last 168 hours.  Discharge time spent: greater than 30 minutes.  Signed: Marrion Coy, MD [facility] 10/26/2022

## 2022-10-26 NOTE — Evaluation (Signed)
Physical Therapy Evaluation Patient Details Name: Courtney Mack MRN: [id number] DOB: 1956-03-25 Today's Date: 10/26/2022  History of Present Illness  Pt is a 67 y/o F admitted on 10/24/22 after presenting to the ED with c/o SOB. Pt is being treated for COPD exacerbation. PMH: GERD, HLD, iron deficiency anemia  Clinical Impression  Pt seen for PT evaluation with pt agreeable. Pt reports prior to admission she was living with family, ambulating with rollator, sleeping in lift chair. On this date, pt requires cuing to use hospital bed features for supine>sit, STS from elevated EOB with supervision, & ambulate short distance in room with RW & supervision. Pt with c/o SOB, especially after completing bed mobility but SPO2 100% on room air. PT educates pt on pursed lip breathing but poor return demo by pt. Session interrupted by nurse & MD in room to assess pt's buttocks wound so pt left in care of nurse & MD.     Recommendations for follow up therapy are one component of a multi-disciplinary discharge planning process, led by the attending physician.  Recommendations may be updated based on patient status, additional functional criteria and insurance authorization.  Follow Up Recommendations       Assistance Recommended at Discharge Intermittent Supervision/Assistance  Patient can return home with the following  A little help with walking and/or transfers;A little help with bathing/dressing/bathroom;Assistance with cooking/housework;Help with stairs or ramp for entrance;Assist for transportation    Equipment Recommendations None recommended by PT  Recommendations for Other Services       Functional Status Assessment Patient has had a recent decline in their functional status and demonstrates the ability to make significant improvements in function in a reasonable and predictable amount of time.     Precautions / Restrictions Precautions Precautions: Fall Precaution Comments: L  drain Restrictions Weight Bearing Restrictions: No      Mobility  Bed Mobility Overal bed mobility: Needs Assistance Bed Mobility: Supine to Sit     Supine to sit: Supervision, HOB elevated     General bed mobility comments: cuing for use of bed rails, extra time to move BLE to EOB & upright trunk    Transfers Overall transfer level: Needs assistance Equipment used: Rolling walker (2 wheels) Transfers: Sit to/from Stand Sit to Stand: Supervision, From elevated surface           General transfer comment: STS from elevated EOB (reports she only ever sits in lift chair & uses it to assist with STS), poor awareness of safe hand placement during STS with RW    Ambulation/Gait Ambulation/Gait assistance: Supervision Gait Distance (Feet): 15 Feet Assistive device: Rolling walker (2 wheels) Gait Pattern/deviations: Decreased step length - right, Decreased step length - left, Decreased stride length Gait velocity: slow, steady     General Gait Details: Pt ambulates to door & turns to go back to bathroom with RW & supervision.  Stairs            Wheelchair Mobility    Modified Rankin (Stroke Patients Only)       Balance Overall balance assessment: Needs assistance Sitting-balance support: Feet supported Sitting balance-Leahy Scale: Fair     Standing balance support: During functional activity, Bilateral upper extremity supported, Reliant on assistive device for balance Standing balance-Leahy Scale: Fair                               Pertinent Vitals/Pain Pain Assessment Pain Assessment: No/denies pain  Home Living Family/patient expects to be discharged to:: Private residence Living Arrangements: Children Available Help at Discharge: Family Type of Home: House Home Access: Stairs to enter Entrance Stairs-Rails: None Entrance Stairs-Number of Steps: 1   Home Layout: One level Home Equipment: Rollator (4 wheels);BSC/3in1;Rolling Walker  (2 wheels) Additional Comments: sleeps in lift chair, sponge bathes at baseline    Prior Function               Mobility Comments: amb in house with rollator, has manual w/c for community distances ADLs Comments: assist for LB dressing, bathing; MOD I in UB dressing, grooming, toileting per pt report, wears briefs 2/2 urinary incontinence     Hand Dominance        Extremity/Trunk Assessment   Upper Extremity Assessment Upper Extremity Assessment: Generalized weakness    Lower Extremity Assessment Lower Extremity Assessment: Generalized weakness    Cervical / Trunk Assessment Cervical / Trunk Assessment:  (L drain)  Communication   Communication: No difficulties  Cognition Arousal/Alertness: Awake/alert Behavior During Therapy: WFL for tasks assessed/performed Overall Cognitive Status: Within Functional Limits for tasks assessed                                          General Comments      Exercises     Assessment/Plan    PT Assessment Patient needs continued PT services  PT Problem List Decreased strength;Cardiopulmonary status limiting activity;Decreased activity tolerance;Decreased balance;Decreased mobility;Decreased safety awareness;Decreased knowledge of use of DME       PT Treatment Interventions DME instruction;Therapeutic exercise;Gait training;Balance training;Stair training;Functional mobility training;Patient/family education;Neuromuscular re-education;Therapeutic activities    PT Goals (Current goals can be found in the Care Plan section)  Acute Rehab PT Goals Patient Stated Goal: get better, go home PT Goal Formulation: With patient Time For Goal Achievement: 11/09/22 Potential to Achieve Goals: Good    Frequency Min 3X/week     Co-evaluation               AM-PAC PT "6 Clicks" Mobility  Outcome Measure Help needed turning from your back to your side while in a flat bed without using bedrails?: A Little Help  needed moving from lying on your back to sitting on the side of a flat bed without using bedrails?: A Lot Help needed moving to and from a bed to a chair (including a wheelchair)?: A Little Help needed standing up from a chair using your arms (e.g., wheelchair or bedside chair)?: A Little Help needed to walk in hospital room?: A Little Help needed climbing 3-5 steps with a railing? : A Lot 6 Click Score: 16    End of Session   Activity Tolerance: Patient tolerated treatment well (MD & Nurse in to assess wound on pt's buttocks - session limited by this - pt left in handoff to nurse & MD)     PT Visit Diagnosis: Difficulty in walking, not elsewhere classified (R26.2);Muscle weakness (generalized) (M62.81)    Time: 1610-9604 PT Time Calculation (min) (ACUTE ONLY): 9 min   Charges:   PT Evaluation $PT Eval Moderate Complexity: 1 Mod          Aleda Grana, PT, DPT 10/26/22, 11:23 AM   Sandi Mariscal 10/26/2022, 11:22 AM

## 2022-10-30 ENCOUNTER — Encounter: Payer: Medicare HMO | Admitting: Family

## 2022-11-01 ENCOUNTER — Encounter: Payer: Medicare HMO | Admitting: Family

## 2022-11-01 ENCOUNTER — Telehealth: Payer: Self-pay | Admitting: Family

## 2022-11-01 NOTE — Telephone Encounter (Signed)
Patient did not show for her [facility] appointment on 11/01/22.

## 2022-11-01 NOTE — Progress Notes (Deleted)
Patient ID: Courtney Mack, female    DOB: July 24, 1955, 67 y.o.   MRN: [id number]  Primary cardiologist: Julien Nordmann, MD (last seen 03/23) PCP: [facility] (last seen   HPI  Courtney Mack is a 67 y/o female with a history of DM, hyperlipidemia, HTN, DVT, PE, anemia, thrombocytopenia, tobacco use and chronic heart failure.    Echo 02/25/22: EF of 65-70%. Echo 09/12/21: EF of 55-60% along with mild LVH/ LAE & mild MR.    Admitted 10/24/22 due to SOB d/t COPD exacerbation. Treated with steroids and antibiotics.                         She presents today for a HF follow-up visit with a chief complaint of   Continues to wear compression boots daily for ~ 1 hour with improvement of lymphedema.   Past Medical History:  Diagnosis Date   Acute bilateral deep vein thrombosis (DVT) of femoral veins (HCC) 01/02/2021   Acute massive pulmonary embolism (HCC) 08/25/2020   Last Assessment & Plan:  Formatting of this note might be different from the original. Post vena caval filter and on xarelto and O2   Anemia    ARF (acute respiratory failure) (HCC)    Cellulitis of left lower leg 06/20/2021   CHF (congestive heart failure) (HCC)    COVID-19    Diabetes mellitus without complication (HCC)    DVT (deep venous thrombosis) (HCC) 09/06/2020   Endometrial cancer (HCC)    GAVE (gastric antral vascular ectasia)    GERD (gastroesophageal reflux disease)    High cholesterol    Hx of blood clots    Hyperlipidemia    Hypertension    IDA (iron deficiency anemia) 09/21/2020   Obesity    Pneumonia 06/11/2022   recovered   Pressure injury of skin 06/21/2021   Pulmonary embolism (HCC)    Sepsis (HCC)    Thrombocytopenia (HCC)    Urinary tract infection 09/13/2021   Past Surgical History:  Procedure Laterality Date   CATARACT EXTRACTION W/PHACO Right 06/04/2022   Procedure: CATARACT EXTRACTION PHACO AND INTRAOCULAR LENS PLACEMENT (IOC) COMPLICATED RIGHT  31.83  02:17.4;   Surgeon: Galen Manila, MD;  Location: [facility];  Service: Ophthalmology;  Laterality: Right;   CATARACT EXTRACTION W/PHACO Left 07/09/2022   Procedure: CATARACT EXTRACTION PHACO AND INTRAOCULAR LENS PLACEMENT (IOC);  Surgeon: Galen Manila, MD;  Location: [facility];  Service: Ophthalmology;  Laterality: Left;  22.75 1:40.3   COLONOSCOPY N/A 06/21/2021   Procedure: COLONOSCOPY;  Surgeon: Toledo, Boykin Nearing, MD;  Location: [facility] ENDOSCOPY;  Service: Gastroenterology;  Laterality: N/A;   CYSTOSCOPY W/ URETERAL STENT PLACEMENT Left 09/11/2021   Procedure: CYSTOSCOPY WITH RETROGRADE PYELOGRAM/URETERAL STENT PLACEMENT;  Surgeon: Riki Altes, MD;  Location: [facility] ORS;  Service: Urology;  Laterality: Left;   CYSTOSCOPY W/ URETERAL STENT PLACEMENT Left 11/17/2021   Procedure: CYSTOSCOPY WITH STENT REPLACEMENT;  Surgeon: Bjorn Pippin, MD;  Location: [facility] ORS;  Service: Urology;  Laterality: Left;   CYSTOSCOPY WITH FULGERATION  11/17/2021   Procedure: CYSTOSCOPY WITH FULGERATION;  Surgeon: Bjorn Pippin, MD;  Location: [facility] ORS;  Service: Urology;;   CYSTOSCOPY WITH STENT PLACEMENT Left 10/17/2021   Procedure: CYSTOSCOPY WITH STENT PLACEMENT;  Surgeon: Riki Altes, MD;  Location: [facility] ORS;  Service: Urology;  Laterality: Left;   CYSTOSCOPY WITH URETEROSCOPY, STONE BASKETRY AND STENT PLACEMENT Left 10/16/2021   Procedure: CYSTOSCOPY WITH URETEROSCOPY  AND STENT REMOVAL;  Surgeon: Riki Altes, MD;  Location: [facility] ORS;  Service: Urology;  Laterality: Left;   ENTEROSCOPY N/A 08/17/2021   Procedure: ENTEROSCOPY;  Surgeon: Toney Reil, MD;  Location: [facility] ENDOSCOPY;  Service: Gastroenterology;  Laterality: N/A;   ESOPHAGOGASTRODUODENOSCOPY N/A 06/21/2021   Procedure: ESOPHAGOGASTRODUODENOSCOPY (EGD);  Surgeon: Toledo, Boykin Nearing, MD;  Location: [facility] ENDOSCOPY;  Service: Gastroenterology;  Laterality: N/A;   ESOPHAGOGASTRODUODENOSCOPY (EGD) WITH PROPOFOL N/A 08/17/2021    Procedure: ESOPHAGOGASTRODUODENOSCOPY (EGD) WITH PROPOFOL;  Surgeon: Toney Reil, MD;  Location: [facility] ENDOSCOPY;  Service: Gastroenterology;  Laterality: N/A;   ESOPHAGOGASTRODUODENOSCOPY (EGD) WITH PROPOFOL N/A 10/14/2021   Procedure: ESOPHAGOGASTRODUODENOSCOPY (EGD) WITH PROPOFOL;  Surgeon: Midge Minium, MD;  Location: [facility] ENDOSCOPY;  Service: Endoscopy;  Laterality: N/A;   ESOPHAGOGASTRODUODENOSCOPY (EGD) WITH PROPOFOL N/A 02/01/2022   Procedure: ESOPHAGOGASTRODUODENOSCOPY (EGD) WITH PROPOFOL;  Surgeon: Toney Reil, MD;  Location: [facility] ENDOSCOPY;  Service: Gastroenterology;  Laterality: N/A;   ESOPHAGOGASTRODUODENOSCOPY (EGD) WITH PROPOFOL N/A 03/15/2022   Procedure: ESOPHAGOGASTRODUODENOSCOPY (EGD) WITH PROPOFOL;  Surgeon: Wyline Mood, MD;  Location: [facility] ENDOSCOPY;  Service: Gastroenterology;  Laterality: N/A;   ESOPHAGOGASTRODUODENOSCOPY (EGD) WITH PROPOFOL N/A 04/18/2022   Procedure: ESOPHAGOGASTRODUODENOSCOPY (EGD) WITH PROPOFOL;  Surgeon: Toney Reil, MD;  Location: [facility] ENDOSCOPY;  Service: Gastroenterology;  Laterality: N/A;   ESOPHAGOGASTRODUODENOSCOPY (EGD) WITH PROPOFOL N/A 08/09/2022   Procedure: ESOPHAGOGASTRODUODENOSCOPY (EGD) WITH PROPOFOL;  Surgeon: Wyline Mood, MD;  Location: [facility] ENDOSCOPY;  Service: Gastroenterology;  Laterality: N/A;   GIVENS CAPSULE STUDY N/A 06/22/2021   Procedure: GIVENS CAPSULE STUDY;  Surgeon: Toledo, Boykin Nearing, MD;  Location: [facility] ENDOSCOPY;  Service: Gastroenterology;  Laterality: N/A;   IR NEPHROSTOMY EXCHANGE LEFT  02/25/2022   IR NEPHROSTOMY EXCHANGE LEFT  03/27/2022   IR NEPHROSTOMY EXCHANGE LEFT  05/22/2022   IR NEPHROSTOMY EXCHANGE LEFT  07/24/2022   IR NEPHROSTOMY EXCHANGE LEFT  09/24/2022   IR NEPHROSTOMY PLACEMENT LEFT  12/27/2021   IVC FILTER INSERTION N/A 08/18/2020   Procedure: IVC FILTER INSERTION;  Surgeon: Renford Dills, MD;  Location: [facility] INVASIVE CV LAB;  Service: Cardiovascular;  Laterality: N/A;   PERIPHERAL  VASCULAR THROMBECTOMY Bilateral 01/03/2021   Procedure: PERIPHERAL VASCULAR THROMBECTOMY;  Surgeon: Annice Needy, MD;  Location: [facility] INVASIVE CV LAB;  Service: Cardiovascular;  Laterality: Bilateral;   PULMONARY THROMBECTOMY N/A 08/18/2020   Procedure: PULMONARY THROMBECTOMY;  Surgeon: Renford Dills, MD;  Location: [facility] INV[facility] CV LAB;  Service: Cardiovascular;  Laterality: N/A;   TUBAL LIGATION     VISCERAL ANGIOGRAPHY N/A 07/18/2021   Procedure: VISCERAL ANGIOGRAPHY;  Surgeon: Annice Needy, MD;  Location: [facility] INVASIVE CV LAB;  Service: Cardiovascular;  Laterality: N/A;   Family History  Problem Relation Age of Onset   Hypertension Mother    Diabetes Mother    Diabetes Father    Hypertension Father    High Cholesterol Father    Congestive Heart Failure Father    Breast cancer Cousin    Social History   Tobacco Use   Smoking status: Never   Smokeless tobacco: Never   Tobacco comments:    smoked 2-4 cigarettes for about 2-3 weeks   Substance Use Topics   Alcohol use: Not Currently   No Known Allergies   Review of Systems  Constitutional:  Positive for fatigue. Negative for appetite change.  HENT:  Negative for congestion, postnasal drip and sore throat.   Eyes:  Positive for visual disturbance (due to cataracts).  Respiratory:  Positive for shortness of breath (minimal). Negative for cough  and chest tightness.   Cardiovascular:  Positive for leg swelling (improving). Negative for chest pain and palpitations.  Gastrointestinal:  Negative for abdominal distention and abdominal pain.  Endocrine: Negative.   Genitourinary: Negative.   Musculoskeletal:  Positive for back pain (at times). Negative for arthralgias.  Skin: Negative.   Allergic/Immunologic: Negative.   Neurological:  Negative for dizziness and light-headedness.  Hematological:  Negative for adenopathy. Bruises/bleeds easily.  Psychiatric/Behavioral:  Negative for dysphoric mood and sleep disturbance (sleeps in  reclincer due to comfort). The patient is not nervous/anxious.      Physical Exam Vitals and nursing note reviewed.  Constitutional:      Appearance: Normal appearance.  HENT:     Head: Normocephalic and atraumatic.  Cardiovascular:     Rate and Rhythm: Normal rate and regular rhythm.  Pulmonary:     Effort: Pulmonary effort is normal. No respiratory distress.     Breath sounds: No wheezing or rales.  Abdominal:     General: There is no distension.     Palpations: Abdomen is soft.  Musculoskeletal:        General: No tenderness.     Cervical back: Normal range of motion and neck supple.     Right lower leg: Edema (+ lymphedema) present.     Left lower leg: Edema (+ lymphedema) present.  Skin:    General: Skin is warm and dry.  Neurological:     General: No focal deficit present.     Mental Status: She is alert and oriented to person, place, and time.  Psychiatric:        Behavior: Behavior normal.        Thought Content: Thought content normal.   Assessment & Plan:   1: Chronic heart failure with preserved ejection fraction- - NYHA class II - euvolemic today - weighing daily; reminded to call for an overnight weight gain of > 2 pounds or a weekly weight gain of > 5 pounds - weight 170.6 pounds from last visit here 3 months ago - Echo 02/25/22: EF of 65-70%. Echo 09/12/21: EF of 55-60% along with mild LVH/ LAE & mild MR.  - not adding salt except on rare occasions;  - saw cardiology Mariah Milling) 03/23 - recurrent UTI's so not a candidate for SGLT2 - BNP 10/24/22 was 54.1   2: HTN- - BP  - follows with PCP at [facility] - BMP 10/26/22 reviewed and showed sodium 138, potassium 4.2, creatinine 0.82 and GFR >60   3: DM- - A1c 11/16/21 was 5.7%   4: Anemia- - hemoglobin 10/26/22 was 7.6 - saw hematology Cathie Hoops) 10/23 - iron infusion done 10/08/22  5: Lymphedema- - stage 2 - limited in her ability to exercise due to leg swelling - unable to wear compression  socks - does elevate her legs in a recliner but swelling never completely resolves - saw vascular (Dew) 08/23 - wearing compression boots daily with improvement of lymphedema  6: Recurrent UTI- - saw urology Lonna Cobb) 10/23 - Underwent nephrogram and nephrostomy tube exchange in the past

## 2022-11-07 ENCOUNTER — Encounter: Payer: Self-pay | Admitting: Family

## 2022-11-07 ENCOUNTER — Ambulatory Visit: Payer: Medicare HMO | Attending: Family | Admitting: Family

## 2022-11-07 VITALS — BP 135/67 | HR 100 | Wt 174.2 lb

## 2022-11-07 DIAGNOSIS — I110 Hypertensive heart disease with heart failure: Secondary | ICD-10-CM | POA: Insufficient documentation

## 2022-11-07 DIAGNOSIS — R0602 Shortness of breath: Secondary | ICD-10-CM | POA: Insufficient documentation

## 2022-11-07 DIAGNOSIS — D649 Anemia, unspecified: Secondary | ICD-10-CM | POA: Diagnosis not present

## 2022-11-07 DIAGNOSIS — F17210 Nicotine dependence, cigarettes, uncomplicated: Secondary | ICD-10-CM | POA: Diagnosis not present

## 2022-11-07 DIAGNOSIS — E119 Type 2 diabetes mellitus without complications: Secondary | ICD-10-CM | POA: Diagnosis not present

## 2022-11-07 DIAGNOSIS — I890 Lymphedema, not elsewhere classified: Secondary | ICD-10-CM | POA: Insufficient documentation

## 2022-11-07 DIAGNOSIS — Z87440 Personal history of urinary (tract) infections: Secondary | ICD-10-CM | POA: Diagnosis not present

## 2022-11-07 DIAGNOSIS — Z86711 Personal history of pulmonary embolism: Secondary | ICD-10-CM | POA: Insufficient documentation

## 2022-11-07 DIAGNOSIS — I10 Essential (primary) hypertension: Secondary | ICD-10-CM

## 2022-11-07 DIAGNOSIS — I428 Other cardiomyopathies: Secondary | ICD-10-CM | POA: Insufficient documentation

## 2022-11-07 DIAGNOSIS — I509 Heart failure, unspecified: Secondary | ICD-10-CM | POA: Diagnosis not present

## 2022-11-07 DIAGNOSIS — Z8249 Family history of ischemic heart disease and other diseases of the circulatory system: Secondary | ICD-10-CM | POA: Insufficient documentation

## 2022-11-07 DIAGNOSIS — J449 Chronic obstructive pulmonary disease, unspecified: Secondary | ICD-10-CM | POA: Diagnosis not present

## 2022-11-07 DIAGNOSIS — Z86718 Personal history of other venous thrombosis and embolism: Secondary | ICD-10-CM | POA: Diagnosis not present

## 2022-11-07 DIAGNOSIS — Z8349 Family history of other endocrine, nutritional and metabolic diseases: Secondary | ICD-10-CM | POA: Insufficient documentation

## 2022-11-07 DIAGNOSIS — Z833 Family history of diabetes mellitus: Secondary | ICD-10-CM | POA: Insufficient documentation

## 2022-11-07 DIAGNOSIS — Z79899 Other long term (current) drug therapy: Secondary | ICD-10-CM | POA: Diagnosis not present

## 2022-11-07 DIAGNOSIS — I5032 Chronic diastolic (congestive) heart failure: Secondary | ICD-10-CM | POA: Diagnosis not present

## 2022-11-07 DIAGNOSIS — N390 Urinary tract infection, site not specified: Secondary | ICD-10-CM

## 2022-11-07 NOTE — Progress Notes (Signed)
Patient ID: Courtney Mack, female    DOB: 1956/03/16, 67 y.o.   MRN: [id number]  Primary cardiologist: Julien Nordmann, MD (last seen 03/23) PCP: [facility] (returns next week)  HPI  Courtney Mack is a 67 y/o female with a history of DM, hyperlipidemia, HTN, COPD, DVT, PE, anemia, thrombocytopenia, tobacco use and chronic heart failure.    Echo 02/25/22: EF of 65-70%. Echo 09/12/21: EF of 55-60% along with mild LVH/ LAE & mild MR.    Admitted 10/24/22 due to SOB d/t COPD exacerbation. Treated with steroids and antibiotics.                         She presents today for a HF follow-up visit with a chief complaint of minimal SOB with moderate exertion. Chronic in nature and "much better" since her recent admission. Has fatigue, very little pedal edema, easy bruising and intermittent back pain along with this. Denies difficulty sleeping, abdominal distention, palpitations, chest pain, cough, dizziness or weight gain.   No longer wearing compression boots because she doesn't have anymore swelling in her legs. Not adding salt and her daughter has been reading food labels and doesn't cook with salt. Is drinking 3-4 water bottles (16.9oz) and sometimes 5 bottles total along with 36 oz soda and ~ 16 oz of milk daily.  TOTAL 102.7- 136.5oz  Past Medical History:  Diagnosis Date   Acute bilateral deep vein thrombosis (DVT) of femoral veins (HCC) 01/02/2021   Acute massive pulmonary embolism (HCC) 08/25/2020   Last Assessment & Plan:  Formatting of this note might be different from the original. Post vena caval filter and on xarelto and O2   Anemia    ARF (acute respiratory failure) (HCC)    Cellulitis of left lower leg 06/20/2021   CHF (congestive heart failure) (HCC)    COVID-19    Diabetes mellitus without complication (HCC)    DVT (deep venous thrombosis) (HCC) 09/06/2020   Endometrial cancer (HCC)    GAVE (gastric antral vascular ectasia)    GERD (gastroesophageal  reflux disease)    High cholesterol    Hx of blood clots    Hyperlipidemia    Hypertension    IDA (iron deficiency anemia) 09/21/2020   Obesity    Pneumonia 06/11/2022   recovered   Pressure injury of skin 06/21/2021   Pulmonary embolism (HCC)    Sepsis (HCC)    Thrombocytopenia (HCC)    Urinary tract infection 09/13/2021   Past Surgical History:  Procedure Laterality Date   CATARACT EXTRACTION W/PHACO Right 06/04/2022   Procedure: CATARACT EXTRACTION PHACO AND INTRAOCULAR LENS PLACEMENT (IOC) COMPLICATED RIGHT  31.83  02:17.4;  Surgeon: Galen Manila, MD;  Location: [facility]ERY [facility];  Service: Ophthalmology;  Laterality: Right;   CATARACT EXTRACTION W/PHACO Left 07/09/2022   Procedure: CATARACT EXTRACTION PHACO AND INTRAOCULAR LENS PLACEMENT (IOC);  Surgeon: Galen Manila, MD;  Location: [facility];  Service: Ophthalmology;  Laterality: Left;  22.75 1:40.3   COLONOSCOPY N/A 06/21/2021   Procedure: COLONOSCOPY;  Surgeon: Toledo, Boykin Nearing, MD;  Location: [facility] ENDOSCOPY;  Service: Gastroenterology;  Laterality: N/A;   CYSTOSCOPY W/ URETERAL STENT PLACEMENT Left 09/11/2021   Procedure: CYSTOSCOPY WITH RETROGRADE PYELOGRAM/URETERAL STENT PLACEMENT;  Surgeon: Riki Altes, MD;  Location: [facility] ORS;  Service: Urology;  Laterality: Left;   CYSTOSCOPY W/ URETERAL STENT PLACEMENT Left 11/17/2021   Procedure: CYSTOSCOPY WITH STENT REPLACEMENT;  Surgeon: Bjorn Pippin, MD;  Location: [facility]  ORS;  Service: Urology;  Laterality: Left;   CYSTOSCOPY WITH FULGERATION  11/17/2021   Procedure: CYSTOSCOPY WITH FULGERATION;  Surgeon: Bjorn Pippin, MD;  Location: [facility] ORS;  Service: Urology;;   CYSTOSCOPY WITH STENT PLACEMENT Left 10/17/2021   Procedure: CYSTOSCOPY WITH STENT PLACEMENT;  Surgeon: Riki Altes, MD;  Location: [facility] ORS;  Service: Urology;  Laterality: Left;   CYSTOSCOPY WITH URETEROSCOPY, STONE BASKETRY AND STENT PLACEMENT Left 10/16/2021   Procedure: CYSTOSCOPY WITH  URETEROSCOPY  AND STENT REMOVAL;  Surgeon: Riki Altes, MD;  Location: [facility] ORS;  Service: Urology;  Laterality: Left;   ENTEROSCOPY N/A 08/17/2021   Procedure: ENTEROSCOPY;  Surgeon: Toney Reil, MD;  Location: [facility] ENDOSCOPY;  Service: Gastroenterology;  Laterality: N/A;   ESOPHAGOGASTRODUODENOSCOPY N/A 06/21/2021   Procedure: ESOPHAGOGASTRODUODENOSCOPY (EGD);  Surgeon: Toledo, Boykin Nearing, MD;  Location: [facility] ENDOSCOPY;  Service: Gastroenterology;  Laterality: N/A;   ESOPHAGOGASTRODUODENOSCOPY (EGD) WITH PROPOFOL N/A 08/17/2021   Procedure: ESOPHAGOGASTRODUODENOSCOPY (EGD) WITH PROPOFOL;  Surgeon: Toney Reil, MD;  Location: [facility] ENDOSCOPY;  Service: Gastroenterology;  Laterality: N/A;   ESOPHAGOGASTRODUODENOSCOPY (EGD) WITH PROPOFOL N/A 10/14/2021   Procedure: ESOPHAGOGASTRODUODENOSCOPY (EGD) WITH PROPOFOL;  Surgeon: Midge Minium, MD;  Location: [facility] ENDOSCOPY;  Service: Endoscopy;  Laterality: N/A;   ESOPHAGOGASTRODUODENOSCOPY (EGD) WITH PROPOFOL N/A 02/01/2022   Procedure: ESOPHAGOGASTRODUODENOSCOPY (EGD) WITH PROPOFOL;  Surgeon: Toney Reil, MD;  Location: [facility] ENDOSCOPY;  Service: Gastroenterology;  Laterality: N/A;   ESOPHAGOGASTRODUODENOSCOPY (EGD) WITH PROPOFOL N/A 03/15/2022   Procedure: ESOPHAGOGASTRODUODENOSCOPY (EGD) WITH PROPOFOL;  Surgeon: Wyline Mood, MD;  Location: [facility] ENDOSCOPY;  Service: Gastroenterology;  Laterality: N/A;   ESOPHAGOGASTRODUODENOSCOPY (EGD) WITH PROPOFOL N/A 04/18/2022   Procedure: ESOPHAGOGASTRODUODENOSCOPY (EGD) WITH PROPOFOL;  Surgeon: Toney Reil, MD;  Location: [facility] ENDOSCOPY;  Service: Gastroenterology;  Laterality: N/A;   ESOPHAGOGASTRODUODENOSCOPY (EGD) WITH PROPOFOL N/A 08/09/2022   Procedure: ESOPHAGOGASTRODUODENOSCOPY (EGD) WITH PROPOFOL;  Surgeon: Wyline Mood, MD;  Location: [facility] ENDOSCOPY;  Service: Gastroenterology;  Laterality: N/A;   GIVENS CAPSULE STUDY N/A 06/22/2021   Procedure: GIVENS CAPSULE STUDY;  Surgeon:  Toledo, Boykin Nearing, MD;  Location: [facility] ENDOSCOPY;  Service: Gastroenterology;  Laterality: N/A;   IR NEPHROSTOMY EXCHANGE LEFT  02/25/2022   IR NEPHROSTOMY EXCHANGE LEFT  03/27/2022   IR NEPHROSTOMY EXCHANGE LEFT  05/22/2022   IR NEPHROSTOMY EXCHANGE LEFT  07/24/2022   IR NEPHROSTOMY EXCHANGE LEFT  09/24/2022   IR NEPHROSTOMY PLACEMENT LEFT  12/27/2021   IVC FILTER INSERTION N/A 08/18/2020   Procedure: IVC FILTER INSERTION;  Surgeon: Renford Dills, MD;  Location: [facility] INVASIVE CV LAB;  Service: Cardiovascular;  Laterality: N/A;   PERIPHERAL VASCULAR THROMBECTOMY Bilateral 01/03/2021   Procedure: PERIPHERAL VASCULAR THROMBECTOMY;  Surgeon: Annice Needy, MD;  Location: [facility] INVASIVE CV LAB;  Service: Cardiovascular;  Laterality: Bilateral;   PULMONARY THROMBECTOMY N/A 08/18/2020   Procedure: PULMONARY THROMBECTOMY;  Surgeon: Renford Dills, MD;  Location: [facility] INVASIVE CV LAB;  Service: Cardiovascular;  Laterality: N/A;   TUBAL LIGATION     VISCERAL ANGIOGRAPHY N/A 07/18/2021   Procedure: VISCERAL ANGIOGRAPHY;  Surgeon: Annice Needy, MD;  Location: [facility] INVASIVE CV LAB;  Service: Cardiovascular;  Laterality: N/A;   Family History  Problem Relation Age of Onset   Hypertension Mother    Diabetes Mother    Diabetes Father    Hypertension Father    High Cholesterol Father    Congestive Heart Failure Father    Breast cancer Cousin    Social History   Tobacco Use   Smoking  status: Never   Smokeless tobacco: Never   Tobacco comments:    smoked 2-4 cigarettes for about 2-3 weeks   Substance Use Topics   Alcohol use: Not Currently   No Known Allergies  Prior to Admission medications   Medication Sig Start Date End Date Taking? Authorizing Provider  acetaminophen (TYLENOL) 500 MG tablet Take 500 mg by mouth every 6 (six) hours as needed for mild pain or fever.   Yes [provider]  albuterol (VENTOLIN HFA) 108 (90 Base) MCG/ACT inhaler Inhale 2 puffs into the lungs every 6  (six) hours as needed for wheezing or shortness of breath. 10/26/22  Yes Marrion Coy, MD  Ascorbic Acid (VITAMIN C) 1000 MG tablet Take 1,000 mg by mouth daily.   Yes [provider]  FEROSUL 325 (65 Fe) MG tablet Take 325 mg by mouth daily with breakfast. 08/14/22  Yes [provider]  fluticasone furoate-vilanterol (BREO ELLIPTA) 100-25 MCG/ACT AEPB Inhale 1 puff into the lungs daily.   Yes [provider]  furosemide (LASIX) 20 MG tablet Take 1 tablet (20 mg total) by mouth daily. Home med. 10/07/22  Yes Rheda Kassab, Inetta Fermo A, FNP  loperamide (IMODIUM) 2 MG capsule Take 2 mg by mouth once. 09/27/22  Yes [provider]  Multiple Vitamin (MULTIVITAMIN WITH MINERALS) TABS tablet Take 1 tablet by mouth daily. 09/12/20  Yes Standley Brooking, MD  pantoprazole (PROTONIX) 40 MG tablet Take 1 tablet (40 mg total) by mouth 2 (two) times daily. 03/01/22 08/01/23 Yes Sreenath, Sudheer B, MD  polyethylene glycol (MIRALAX / GLYCOLAX) 17 g packet Take 17 g by mouth daily as needed for mild constipation. 03/29/22  Yes Rolly Salter, MD  rosuvastatin (CRESTOR) 10 MG tablet Take 1 tablet (10 mg total) by mouth daily. 10/03/20  Yes Rickard Patience, MD    Review of Systems  Constitutional:  Positive for fatigue. Negative for appetite change.  HENT:  Negative for congestion, postnasal drip and sore throat.   Eyes:  Positive for visual disturbance (due to cataracts).  Respiratory:  Positive for shortness of breath (minimal). Negative for cough and chest tightness.   Cardiovascular:  Positive for leg swelling (improving). Negative for chest pain and palpitations.  Gastrointestinal:  Negative for abdominal distention and abdominal pain.  Endocrine: Negative.   Genitourinary: Negative.   Musculoskeletal:  Positive for back pain (at times). Negative for arthralgias.  Skin: Negative.   Allergic/Immunologic: Negative.   Neurological:  Negative for dizziness and light-headedness.  Hematological:   Negative for adenopathy. Bruises/bleeds easily.  Psychiatric/Behavioral:  Negative for dysphoric mood and sleep disturbance (sleeps in reclincer due to comfort). The patient is not nervous/anxious.    Vitals:   11/07/22 1138  BP: 135/67  Pulse: 100  SpO2: 98%  Weight: 174 lb 3.2 oz (79 kg)   Wt Readings from Last 3 Encounters:  11/07/22 174 lb 3.2 oz (79 kg)  10/24/22 176 lb 12.9 oz (80.2 kg)  09/09/22 176 lb 12.8 oz (80.2 kg)   Lab Results  Component Value Date   CREATININE 0.82 10/26/2022   CREATININE 0.88 10/25/2022   CREATININE 0.99 10/24/2022   Physical Exam Vitals and nursing note reviewed. Exam conducted with a chaperone present (daughter).  Constitutional:      Appearance: Normal appearance.  HENT:     Head: Normocephalic and atraumatic.  Cardiovascular:     Rate and Rhythm: Normal rate and regular rhythm.  Pulmonary:     Effort: Pulmonary effort is normal. No respiratory  distress.     Breath sounds: No wheezing or rales.  Abdominal:     General: There is no distension.     Palpations: Abdomen is soft.  Musculoskeletal:        General: No tenderness.     Cervical back: Normal range of motion and neck supple.     Right lower leg: No edema.     Left lower leg: No edema.  Skin:    General: Skin is warm and dry.  Neurological:     General: No focal deficit present.     Mental Status: She is alert and oriented to person, place, and time.  Psychiatric:        Behavior: Behavior normal.        Thought Content: Thought content normal.   Assessment & Plan:   1: NICM with preserved ejection fraction- - NYHA class II - euvolemic today - weighing daily; reminded to call for an overnight weight gain of > 2 pounds or a weekly weight gain of > 5 pounds - weight up 4 pounds from last visit here 3 months ago - etiology likely HTN/ COPD - Echo 02/25/22: EF of 65-70%. Echo 09/12/21: EF of 55-60% along with mild LVH/ LAE & mild MR.  - not adding salt except on rare  occasions;  - saw cardiology Mariah Milling) 03/23 - continue furosemide 20mg  daily - recurrent UTI's so not a candidate for SGLT2 - reviewed the importance of keeping her total daily fluid intake to 60-64oz and that, currently, she's drinking >100 oz daily - BNP 10/24/22 was 54.1 - PharmD reconciled meds w/ patient   2: HTN- - BP 135/67 - follows with PCP at [facility] and returns next week - BMP 10/26/22 reviewed and showed sodium 138, potassium 4.2, creatinine 0.82 and GFR >60   3: DM- - A1c 11/16/21 was 5.7%   4: Anemia- - hemoglobin 10/26/22 was 7.6 - saw hematology Cathie Hoops) 10/23 - iron infusion done 10/08/22  5: Lymphedema- - resolved - no longer using compression boots but plans to go get measured for compression socks  6: Recurrent UTI- - saw urology Lonna Cobb) 10/23 - underwent nephrogram and nephrostomy tube exchange in the past - not a candidate for SGLT2   Return in 2 months, sooner if needed.

## 2022-11-07 NOTE — Progress Notes (Signed)
[facility] - Pharmacist Education Note  Assessment Courtney Mack is a 67 y.o. female with HFpEF (EF >50%) presenting to the Heart Failure Clinic for follow up. Patient is here today with her daughter. Patient and daughter report no medication acces issues and no adverse drug events. Patient reports taking weight and BP daily and that her weight has been pretty stable recently. Patient and daughter report that the patient drinks at least 3-4 bottles of water, 2 cans of soda, and some milk daily. Daughter states that she encourages the patient to drink plenty of fluids including up to 5 bottles of water daily. Patient and daughter also inquired about lasix refills to prevent leg edema. NP counseled patient on appropriate fluid intake given diagnosis of CHF.   Recent ED Visit (past 6 months):  Date: 10/24/2022, CC: sepsis Date: 08/07/2022, CC: anemia Date: 06/11/2022, CC: worsening cough, dyspnea, and weakness  Guideline-Directed Medical Therapy/Evidence Based Medicine ACE/ARB/ARNI:  none Beta Blocker:  none Aldosterone Antagonist:  none Diuretic: Furosemide 20 mg daily SGLT2i:  none, not a candidate given history of recurrent UTI  Adherence Assessment Do you ever forget to take your medication? [] Yes [x] No  Do you ever skip doses due to side effects? [] Yes [x] No  Do you have trouble affording your medicines? [] Yes [x] No  Are you ever unable to pick up your medication due to transportation difficulties? [] Yes [x] No  Do you ever stop taking your medications because you don't believe they are helping? [] Yes [x] No  Do you check your weight daily? [x] Yes [] No  Adherence strategy: Pill box. Daughter and grandchildren help fill it. Barriers to obtaining medications: None reported   Diagnostics ECHO: Date 02/25/2022, EF 65-70%, no RWMA, G1DD  Vitals    10/26/2022   12:25 PM 10/26/2022    7:30 AM 10/26/2022    3:09 AM  Vitals with BMI  Systolic 136 143 161  Diastolic 59  61 61  Pulse 67 66 69     Recent Labs    Latest Ref Rng & Units 10/26/2022    4:48 AM 10/25/2022    6:33 AM 10/24/2022   10:10 AM  BMP  Glucose 70 - 99 mg/dL 096  045  409   BUN 8 - 23 mg/dL 29  24  23    Creatinine 0.44 - 1.00 mg/dL 8.11  9.14  7.82   Sodium 135 - 145 mmol/L 138  137  137   Potassium 3.5 - 5.1 mmol/L 4.2  3.7  3.2   Chloride 98 - 111 mmol/L 105  103  102   CO2 22 - 32 mmol/L 25  25  23    Calcium 8.9 - 10.3 mg/dL 9.2  9.1  8.9     Past Medical History Past Medical History:  Diagnosis Date   Acute bilateral deep vein thrombosis (DVT) of femoral veins (HCC) 01/02/2021   Acute massive pulmonary embolism (HCC) 08/25/2020   Last Assessment & Plan:  Formatting of this note might be different from the original. Post vena caval filter and on xarelto and O2   Anemia    ARF (acute respiratory failure) (HCC)    Cellulitis of left lower leg 06/20/2021   CHF (congestive heart failure) (HCC)    COVID-19    Diabetes mellitus without complication (HCC)    DVT (deep venous thrombosis) (HCC) 09/06/2020   Endometrial cancer (HCC)    GAVE (gastric antral vascular ectasia)    GERD (gastroesophageal reflux disease)    High cholesterol  Hx of blood clots    Hyperlipidemia    Hypertension    IDA (iron deficiency anemia) 09/21/2020   Obesity    Pneumonia 06/11/2022   recovered   Pressure injury of skin 06/21/2021   Pulmonary embolism (HCC)    Sepsis (HCC)    Thrombocytopenia (HCC)    Urinary tract infection 09/13/2021    Plan CHF Continue furosemide 20 mg daily Reduce fluid intake to 60-64 oz daily Continue monitoring weights daily  Time spent: 15 minutes  Celene Squibb, PharmD PGY1 Pharmacy Resident 11/07/2022 11:37 AM

## 2022-11-07 NOTE — Patient Instructions (Signed)
Continue weighing daily and call for an overnight weight gain of 3 pounds or more or a weekly weight gain of more than 5 pounds.   Keep daily fluid intake to 60-64 ounces   You can go to [employer] store to get measured for compression socks

## 2022-11-19 ENCOUNTER — Other Ambulatory Visit: Payer: Self-pay | Admitting: Student

## 2022-11-19 NOTE — Progress Notes (Signed)
Patient for IR LT Neph Tube Exchange on Wed 11/20/2022, I called and spoke with the patient on the phone and gave pre-procedure instructions. Pt was made aware to be here at 2:30p and [facility]. Pt stated understanding.  Called 11/19/2022

## 2022-11-20 ENCOUNTER — Emergency Department: Payer: Medicare HMO

## 2022-11-20 ENCOUNTER — Inpatient Hospital Stay
Admission: EM | Admit: 2022-11-20 | Discharge: 2022-11-23 | DRG: 698 | Disposition: A | Discharge status: Home Health | Payer: Medicare HMO | Attending: Internal Medicine | Admitting: Internal Medicine

## 2022-11-20 ENCOUNTER — Other Ambulatory Visit: Payer: Self-pay

## 2022-11-20 ENCOUNTER — Encounter: Payer: Self-pay | Admitting: Emergency Medicine

## 2022-11-20 ENCOUNTER — Ambulatory Visit
Admission: RE | Admit: 2022-11-20 | Discharge: 2022-11-20 | Disposition: A | Discharge status: Home / Self Care | Payer: Medicare HMO | Source: Ambulatory Visit | Attending: Interventional Radiology | Admitting: Interventional Radiology

## 2022-11-20 ENCOUNTER — Ambulatory Visit: Admission: RE | Admit: 2022-11-20 | Payer: Medicare HMO | Source: Ambulatory Visit | Admitting: Radiology

## 2022-11-20 DIAGNOSIS — Z9851 Tubal ligation status: Secondary | ICD-10-CM

## 2022-11-20 DIAGNOSIS — T83512A Infection and inflammatory reaction due to nephrostomy catheter, initial encounter: Secondary | ICD-10-CM | POA: Diagnosis not present

## 2022-11-20 DIAGNOSIS — D649 Anemia, unspecified: Secondary | ICD-10-CM | POA: Diagnosis present

## 2022-11-20 DIAGNOSIS — E785 Hyperlipidemia, unspecified: Secondary | ICD-10-CM | POA: Diagnosis present

## 2022-11-20 DIAGNOSIS — R079 Chest pain, unspecified: Secondary | ICD-10-CM

## 2022-11-20 DIAGNOSIS — N10 Acute tubulo-interstitial nephritis: Secondary | ICD-10-CM

## 2022-11-20 DIAGNOSIS — Z95828 Presence of other vascular implants and grafts: Secondary | ICD-10-CM

## 2022-11-20 DIAGNOSIS — Z8701 Personal history of pneumonia (recurrent): Secondary | ICD-10-CM

## 2022-11-20 DIAGNOSIS — E7800 Pure hypercholesterolemia, unspecified: Secondary | ICD-10-CM | POA: Diagnosis present

## 2022-11-20 DIAGNOSIS — N12 Tubulo-interstitial nephritis, not specified as acute or chronic: Secondary | ICD-10-CM | POA: Diagnosis not present

## 2022-11-20 DIAGNOSIS — D500 Iron deficiency anemia secondary to blood loss (chronic): Secondary | ICD-10-CM | POA: Diagnosis present

## 2022-11-20 DIAGNOSIS — R0600 Dyspnea, unspecified: Secondary | ICD-10-CM

## 2022-11-20 DIAGNOSIS — Z79899 Other long term (current) drug therapy: Secondary | ICD-10-CM

## 2022-11-20 DIAGNOSIS — Z9841 Cataract extraction status, right eye: Secondary | ICD-10-CM

## 2022-11-20 DIAGNOSIS — E669 Obesity, unspecified: Secondary | ICD-10-CM | POA: Insufficient documentation

## 2022-11-20 DIAGNOSIS — Z8542 Personal history of malignant neoplasm of other parts of uterus: Secondary | ICD-10-CM

## 2022-11-20 DIAGNOSIS — Z961 Presence of intraocular lens: Secondary | ICD-10-CM | POA: Diagnosis present

## 2022-11-20 DIAGNOSIS — N1330 Unspecified hydronephrosis: Secondary | ICD-10-CM | POA: Diagnosis present

## 2022-11-20 DIAGNOSIS — A4150 Gram-negative sepsis, unspecified: Secondary | ICD-10-CM | POA: Diagnosis present

## 2022-11-20 DIAGNOSIS — I5032 Chronic diastolic (congestive) heart failure: Secondary | ICD-10-CM | POA: Diagnosis present

## 2022-11-20 DIAGNOSIS — Z86718 Personal history of other venous thrombosis and embolism: Secondary | ICD-10-CM

## 2022-11-20 DIAGNOSIS — R7989 Other specified abnormal findings of blood chemistry: Principal | ICD-10-CM

## 2022-11-20 DIAGNOSIS — Z87440 Personal history of urinary (tract) infections: Secondary | ICD-10-CM

## 2022-11-20 DIAGNOSIS — E119 Type 2 diabetes mellitus without complications: Secondary | ICD-10-CM | POA: Diagnosis present

## 2022-11-20 DIAGNOSIS — N136 Pyonephrosis: Secondary | ICD-10-CM | POA: Diagnosis present

## 2022-11-20 DIAGNOSIS — E876 Hypokalemia: Secondary | ICD-10-CM | POA: Diagnosis present

## 2022-11-20 DIAGNOSIS — Z833 Family history of diabetes mellitus: Secondary | ICD-10-CM

## 2022-11-20 DIAGNOSIS — Z86711 Personal history of pulmonary embolism: Secondary | ICD-10-CM

## 2022-11-20 DIAGNOSIS — J449 Chronic obstructive pulmonary disease, unspecified: Secondary | ICD-10-CM | POA: Diagnosis present

## 2022-11-20 DIAGNOSIS — Z9842 Cataract extraction status, left eye: Secondary | ICD-10-CM

## 2022-11-20 DIAGNOSIS — R6520 Severe sepsis without septic shock: Secondary | ICD-10-CM | POA: Diagnosis present

## 2022-11-20 DIAGNOSIS — K219 Gastro-esophageal reflux disease without esophagitis: Secondary | ICD-10-CM | POA: Diagnosis present

## 2022-11-20 DIAGNOSIS — Z8249 Family history of ischemic heart disease and other diseases of the circulatory system: Secondary | ICD-10-CM

## 2022-11-20 DIAGNOSIS — Z6836 Body mass index (BMI) 36.0-36.9, adult: Secondary | ICD-10-CM

## 2022-11-20 DIAGNOSIS — I110 Hypertensive heart disease with heart failure: Secondary | ICD-10-CM | POA: Diagnosis present

## 2022-11-20 DIAGNOSIS — I890 Lymphedema, not elsewhere classified: Secondary | ICD-10-CM | POA: Diagnosis present

## 2022-11-20 DIAGNOSIS — Y848 Other medical procedures as the cause of abnormal reaction of the patient, or of later complication, without mention of misadventure at the time of the procedure: Secondary | ICD-10-CM | POA: Diagnosis present

## 2022-11-20 LAB — COMPREHENSIVE METABOLIC PANEL
ALT: 12 U/L (ref 0–44)
AST: 25 U/L (ref 15–41)
Albumin: 3.4 g/dL — ABNORMAL LOW (ref 3.5–5.0)
Alkaline Phosphatase: 51 U/L (ref 38–126)
Anion gap: 11 (ref 5–15)
BUN: 19 mg/dL (ref 8–23)
CO2: 24 mmol/L (ref 22–32)
Calcium: 8.9 mg/dL (ref 8.9–10.3)
Chloride: 103 mmol/L (ref 98–111)
Creatinine, Ser: 0.94 mg/dL (ref 0.44–1.00)
GFR, Estimated: >60 mL/min (ref >60)
Glucose, Bld: 146 mg/dL — ABNORMAL HIGH (ref 70–99)
Potassium: 3.3 mmol/L — ABNORMAL LOW (ref 3.5–5.1)
Sodium: 138 mmol/L (ref 135–145)
Total Bilirubin: 1 mg/dL (ref 0.3–1.2)
Total Protein: 7.1 g/dL (ref 6.5–8.1)

## 2022-11-20 LAB — URINALYSIS, ROUTINE W REFLEX MICROSCOPIC
Bacteria, UA: NONE SEEN
Bilirubin Urine: NEGATIVE
Glucose, UA: NEGATIVE mg/dL
Hgb urine dipstick: NEGATIVE
Ketones, ur: NEGATIVE mg/dL
Nitrite: NEGATIVE
Protein, ur: NEGATIVE mg/dL
Specific Gravity, Urine: 1.011 (ref 1.005–1.030)
pH: 7 (ref 5.0–8.0)

## 2022-11-20 LAB — BLOOD GAS, VENOUS
Acid-Base Excess: 1.5 mmol/L (ref 0.0–2.0)
Bicarbonate: 26.6 mmol/L (ref 20.0–28.0)
O2 Saturation: 52 %
Patient temperature: 37
pCO2, Ven: 43 mmHg — ABNORMAL LOW (ref 44–60)
pH, Ven: 7.4 (ref 7.25–7.43)
pO2, Ven: 34 mmHg (ref 32–45)

## 2022-11-20 LAB — CBC
HCT: 29.5 % — ABNORMAL LOW (ref 36.0–46.0)
Hemoglobin: 8.5 g/dL — ABNORMAL LOW (ref 12.0–15.0)
MCH: 25.2 pg — ABNORMAL LOW (ref 26.0–34.0)
MCHC: 28.8 g/dL — ABNORMAL LOW (ref 30.0–36.0)
MCV: 87.5 fL (ref 80.0–100.0)
Platelets: 303 10*3/uL (ref 150–400)
RBC: 3.37 MIL/uL — ABNORMAL LOW (ref 3.87–5.11)
RDW: 19.1 % — ABNORMAL HIGH (ref 11.5–15.5)
WBC: 4.3 10*3/uL (ref 4.0–10.5)
nRBC: 0 % (ref 0.0–0.2)

## 2022-11-20 LAB — BRAIN NATRIURETIC PEPTIDE: B Natriuretic Peptide: 41.4 pg/mL (ref 0.0–100.0)

## 2022-11-20 LAB — PROCALCITONIN: Procalcitonin: <0.1 ng/mL

## 2022-11-20 LAB — TROPONIN I (HIGH SENSITIVITY)
Troponin I (High Sensitivity): 8 ng/L (ref <18)
Troponin I (High Sensitivity): 12 ng/L (ref <18)

## 2022-11-20 LAB — LACTIC ACID, PLASMA: Lactic Acid, Venous: 3.8 mmol/L (ref 0.5–1.9)

## 2022-11-20 LAB — D-DIMER, QUANTITATIVE: D-Dimer, Quant: 1.28 ug/mL-FEU — ABNORMAL HIGH (ref 0.00–0.50)

## 2022-11-20 MED ORDER — IOHEXOL 350 MG/ML SOLN
75.0000 mL | Freq: Once | INTRAVENOUS | Status: AC | PRN
  Administered 2022-11-20: 75 mL via INTRAVENOUS

## 2022-11-20 MED ORDER — SODIUM CHLORIDE 0.9 % IV BOLUS
1000.0000 mL | Freq: Once | INTRAVENOUS | Status: AC
  Administered 2022-11-20: 1000 mL via INTRAVENOUS

## 2022-11-20 MED ORDER — VANCOMYCIN HCL 2000 MG/400ML IV SOLN
2000.0000 mg | Freq: Once | INTRAVENOUS | Status: AC
  Administered 2022-11-21: 2000 mg via INTRAVENOUS
  Filled 2022-11-20: qty 400

## 2022-11-20 MED ORDER — SODIUM CHLORIDE 0.9 % IV SOLN
2.0000 g | Freq: Once | INTRAVENOUS | Status: AC
  Administered 2022-11-20: 2 g via INTRAVENOUS
  Filled 2022-11-20: qty 12.5

## 2022-11-20 NOTE — Progress Notes (Signed)
PHARMACY -  BRIEF ANTIBIOTIC NOTE   Pharmacy has received consult(s) for Vancomycin from an ED provider.  The patient's profile has been reviewed for ht/wt/allergies/indication/available labs.    One time order(s) placed for Vancomycin 2 gm per pt wt: 81.6 kg  Further antibiotics/pharmacy consults should be ordered by admitting physician if indicated.                       Thank you, Otelia Sergeant, PharmD, [facility] 11/20/2022 10:37 PM

## 2022-11-20 NOTE — ED Provider Notes (Signed)
[facility] Provider Note    Event Date/Time   First MD Initiated Contact with Patient 11/20/22 2108     (approximate)   History   Shortness of Breath   HPI  Courtney Mack is a 67 y.o. female here with shortness of breath.  The patient states that earlier this afternoon, she developed acute, severe, shortness of breath with difficulty breathing.  She states that she had been relatively well prior to that.  However, she has had increasing sediment and cloudy urine in her left nephrostomy tube which is due to be replaced soon.  She states that she has had severe shortness of breath since then.  She has difficulty even walking.  Denies any overt chest pain.  She is states she was just recently diagnosed with COPD during her last admission and she is been using her inhaler.  This was a surprise.  She does not smoke.  Denies any sputum production.  No hemoptysis.   Physical Exam   Triage Vital Signs: ED Triage Vitals  Enc Vitals Group     BP 11/20/22 1806 124/64     Pulse Rate 11/20/22 1806 93     Resp 11/20/22 1806 20     Temp 11/20/22 1806 98.7 F (37.1 C)     Temp Source 11/20/22 2120 Oral     SpO2 11/20/22 1806 100 %     Weight 11/20/22 1805 180 lb (81.6 kg)     Height 11/20/22 1805 4\' 11"  (1.499 m)     Head Circumference --      Peak Flow --      Pain Score 11/20/22 1805 7     Pain Loc --      Pain Edu? --      Excl. in GC? --     Most recent vital signs: Vitals:   11/20/22 2200 11/20/22 2228  BP: (!) 112/92 (!) 117/57  Pulse: 91 83  Resp: (!) 21 19  Temp:    SpO2: 100% 100%     General: Awake, no distress.  Anxious. CV:  Good peripheral perfusion.  Regular rate and rhythm. Resp:  Significant tachypnea, lungs overall clear.  No wheezing. Abd:  No distention.  Other:  Trace edema bilateral lower extremities.   ED Results / Procedures / Treatments   Labs (all labs ordered are listed, but only abnormal results are  displayed) Labs Reviewed  CBC - Abnormal; Notable for the following components:      Result Value   RBC 3.37 (*)    Hemoglobin 8.5 (*)    HCT 29.5 (*)    MCH 25.2 (*)    MCHC 28.8 (*)    RDW 19.1 (*)    All other components within normal limits  COMPREHENSIVE METABOLIC PANEL - Abnormal; Notable for the following components:   Potassium 3.3 (*)    Glucose, Bld 146 (*)    Albumin 3.4 (*)    All other components within normal limits  BLOOD GAS, VENOUS - Abnormal; Notable for the following components:   pCO2, Ven 43 (*)    All other components within normal limits  LACTIC ACID, PLASMA - Abnormal; Notable for the following components:   Lactic Acid, Venous 3.8 (*)    All other components within normal limits  URINALYSIS, ROUTINE W REFLEX MICROSCOPIC - Abnormal; Notable for the following components:   Color, Urine YELLOW (*)    APPearance HAZY (*)    Leukocytes,Ua MODERATE (*)  All other components within normal limits  D-DIMER, QUANTITATIVE - Abnormal; Notable for the following components:   D-Dimer, Quant 1.28 (*)    All other components within normal limits  CULTURE, BLOOD (SINGLE)  BRAIN NATRIURETIC PEPTIDE  PROCALCITONIN  LACTIC ACID, PLASMA  TROPONIN I (HIGH SENSITIVITY)  TROPONIN I (HIGH SENSITIVITY)     EKG Normal sinus rhythm, trickle rate 95.  PR 154, QRS 68, QTc 464.  No acute ST elevations or depressions.  EKG evidence of acute ischemic infarct.   RADIOLOGY CT angio: Pending CT A/P, pending Chest x-ray: Clear   I also independently reviewed and agree with radiologist interpretations.   PROCEDURES:  Critical Care performed: Yes, see critical care procedure note(s)  .Critical Care  Performed by: Shaune Pollack, MD Authorized by: Shaune Pollack, MD   Critical care provider statement:    Critical care time (minutes):  30   Critical care time was exclusive of:  Separately billable procedures and treating other patients   Critical care was necessary  to treat or prevent imminent or life-threatening deterioration of the following conditions:  Cardiac failure, circulatory failure, respiratory failure and sepsis   Critical care was time spent personally by me on the following activities:  Development of treatment plan with patient or surrogate, discussions with consultants, evaluation of patient's response to treatment, examination of patient, ordering and review of laboratory studies, ordering and review of radiographic studies, ordering and performing treatments and interventions, pulse oximetry, re-evaluation of patient's condition and review of old charts     MEDICATIONS ORDERED IN ED: Medications  ceFEPIme (MAXIPIME) 2 g in sodium chloride 0.9 % 100 mL IVPB (2 g Intravenous New Bag/Given 11/20/22 2308)  vancomycin (VANCOREADY) IVPB 2000 mg/400 mL (has no administration in time range)  iohexol (OMNIPAQUE) 350 MG/ML injection 75 mL (has no administration in time range)  sodium chloride 0.9 % bolus 1,000 mL (1,000 mLs Intravenous New Bag/Given 11/20/22 2307)     IMPRESSION / MDM / ASSESSMENT AND PLAN / ED COURSE  I reviewed the triage vital signs and the nursing notes.                              Differential diagnosis includes, but is not limited to, PE, metabolic acidosis, COPD, PNA, anemia, anxiety, arrhythmia  Patient's presentation is most consistent with acute presentation with potential threat to life or bodily function.  The patient is on the cardiac monitor to evaluate for evidence of arrhythmia and/or significant heart rate changes   67 yo F with PMHx DVT with PE, CHF, DM, COPD/recurrent PNA, here with SOB. Pt markedly tachypneic on arrival but with clear breath sounds and sat 100%. DDx includes PE, metabolic acidosis, COPD/early bronchitis, anemia.   Labs obtained, reviewed.  Anemia appears to be at baseline.  Blood gas shows no significant acidosis.  CMP shows normal renal function.  BNP is normal.  Lactic acid elevated at  3.8 and patient has positive D-dimer as well.  CBC without major leukocytosis.  Troponin negative.  EKG nonischemic.  Patient does have some left-sided flank pain which could be contributing to some of her subjective shortness of breath or diaphragmatic irritation from what appears to be a UTI.  She has a left nephrostomy tube in place.  Will cover empirically for this in the setting of her lactic acidosis and tachypnea.  Otherwise, will send for stat CT angio as well as abdomen pelvis to evaluate for PE  or other etiology.  Suspect patient will need to be admitted.   FINAL CLINICAL IMPRESSION(S) / ED DIAGNOSES   Final diagnoses:  Elevated lactic acid level  Dyspnea, unspecified type  Pyelonephritis     Rx / DC Orders   ED Discharge Orders     None        Note:  This document was prepared using Dragon voice recognition software and may include unintentional dictation errors.   Shaune Pollack, MD 11/20/22 [phone]

## 2022-11-20 NOTE — ED Triage Notes (Signed)
Pt via EMS from home. Pt c/o SOB, initial O2 was 100% on RA. Pt also endorses diarrhea. Denies sick contacts. EMS reports VSS. Pt has a hx of COPD/CHF. Pt is A&Ox4 and NAD

## 2022-11-20 NOTE — ED Notes (Signed)
2 attempts at IV for CT and cultures unsuccessful. ANother RN to use US guided to try to secure a line and get cultures.

## 2022-11-20 NOTE — ED Notes (Signed)
Pt to CT

## 2022-11-21 ENCOUNTER — Inpatient Hospital Stay: Payer: Medicare HMO | Admitting: Radiology

## 2022-11-21 ENCOUNTER — Ambulatory Visit: Admission: RE | Admit: 2022-11-21 | Payer: Medicare HMO | Source: Ambulatory Visit | Admitting: Radiology

## 2022-11-21 ENCOUNTER — Inpatient Hospital Stay (HOSPITAL_COMMUNITY)
Admit: 2022-11-21 | Discharge: 2022-11-21 | Disposition: A | Discharge status: Home / Self Care | Payer: Medicare HMO | Attending: Physician Assistant | Admitting: Physician Assistant

## 2022-11-21 DIAGNOSIS — Z87440 Personal history of urinary (tract) infections: Secondary | ICD-10-CM | POA: Diagnosis not present

## 2022-11-21 DIAGNOSIS — I5032 Chronic diastolic (congestive) heart failure: Secondary | ICD-10-CM | POA: Diagnosis present

## 2022-11-21 DIAGNOSIS — D509 Iron deficiency anemia, unspecified: Secondary | ICD-10-CM | POA: Diagnosis not present

## 2022-11-21 DIAGNOSIS — E669 Obesity, unspecified: Secondary | ICD-10-CM | POA: Diagnosis present

## 2022-11-21 DIAGNOSIS — N136 Pyonephrosis: Secondary | ICD-10-CM | POA: Diagnosis present

## 2022-11-21 DIAGNOSIS — R079 Chest pain, unspecified: Secondary | ICD-10-CM | POA: Diagnosis not present

## 2022-11-21 DIAGNOSIS — N390 Urinary tract infection, site not specified: Secondary | ICD-10-CM | POA: Diagnosis not present

## 2022-11-21 DIAGNOSIS — I110 Hypertensive heart disease with heart failure: Secondary | ICD-10-CM | POA: Diagnosis present

## 2022-11-21 DIAGNOSIS — R6520 Severe sepsis without septic shock: Secondary | ICD-10-CM | POA: Diagnosis present

## 2022-11-21 DIAGNOSIS — E7800 Pure hypercholesterolemia, unspecified: Secondary | ICD-10-CM | POA: Diagnosis present

## 2022-11-21 DIAGNOSIS — N10 Acute tubulo-interstitial nephritis: Secondary | ICD-10-CM | POA: Diagnosis not present

## 2022-11-21 DIAGNOSIS — T83512A Infection and inflammatory reaction due to nephrostomy catheter, initial encounter: Secondary | ICD-10-CM | POA: Diagnosis present

## 2022-11-21 DIAGNOSIS — A4150 Gram-negative sepsis, unspecified: Secondary | ICD-10-CM | POA: Diagnosis present

## 2022-11-21 DIAGNOSIS — J449 Chronic obstructive pulmonary disease, unspecified: Secondary | ICD-10-CM | POA: Diagnosis present

## 2022-11-21 DIAGNOSIS — Z8249 Family history of ischemic heart disease and other diseases of the circulatory system: Secondary | ICD-10-CM | POA: Diagnosis not present

## 2022-11-21 DIAGNOSIS — E785 Hyperlipidemia, unspecified: Secondary | ICD-10-CM

## 2022-11-21 DIAGNOSIS — Y848 Other medical procedures as the cause of abnormal reaction of the patient, or of later complication, without mention of misadventure at the time of the procedure: Secondary | ICD-10-CM | POA: Diagnosis present

## 2022-11-21 DIAGNOSIS — Z95828 Presence of other vascular implants and grafts: Secondary | ICD-10-CM | POA: Diagnosis not present

## 2022-11-21 DIAGNOSIS — R0600 Dyspnea, unspecified: Secondary | ICD-10-CM

## 2022-11-21 DIAGNOSIS — R7989 Other specified abnormal findings of blood chemistry: Principal | ICD-10-CM

## 2022-11-21 DIAGNOSIS — I890 Lymphedema, not elsewhere classified: Secondary | ICD-10-CM | POA: Diagnosis present

## 2022-11-21 DIAGNOSIS — E119 Type 2 diabetes mellitus without complications: Secondary | ICD-10-CM | POA: Diagnosis present

## 2022-11-21 DIAGNOSIS — Z961 Presence of intraocular lens: Secondary | ICD-10-CM | POA: Diagnosis present

## 2022-11-21 DIAGNOSIS — D649 Anemia, unspecified: Secondary | ICD-10-CM | POA: Diagnosis not present

## 2022-11-21 DIAGNOSIS — N1330 Unspecified hydronephrosis: Secondary | ICD-10-CM | POA: Diagnosis not present

## 2022-11-21 DIAGNOSIS — Z8542 Personal history of malignant neoplasm of other parts of uterus: Secondary | ICD-10-CM | POA: Diagnosis not present

## 2022-11-21 DIAGNOSIS — N12 Tubulo-interstitial nephritis, not specified as acute or chronic: Secondary | ICD-10-CM | POA: Diagnosis present

## 2022-11-21 DIAGNOSIS — Z6836 Body mass index (BMI) 36.0-36.9, adult: Secondary | ICD-10-CM | POA: Diagnosis not present

## 2022-11-21 DIAGNOSIS — E876 Hypokalemia: Secondary | ICD-10-CM | POA: Diagnosis present

## 2022-11-21 DIAGNOSIS — Z86711 Personal history of pulmonary embolism: Secondary | ICD-10-CM | POA: Diagnosis not present

## 2022-11-21 DIAGNOSIS — K219 Gastro-esophageal reflux disease without esophagitis: Secondary | ICD-10-CM

## 2022-11-21 DIAGNOSIS — Z86718 Personal history of other venous thrombosis and embolism: Secondary | ICD-10-CM | POA: Diagnosis not present

## 2022-11-21 DIAGNOSIS — D500 Iron deficiency anemia secondary to blood loss (chronic): Secondary | ICD-10-CM | POA: Diagnosis present

## 2022-11-21 DIAGNOSIS — Z8701 Personal history of pneumonia (recurrent): Secondary | ICD-10-CM | POA: Diagnosis not present

## 2022-11-21 DIAGNOSIS — Z833 Family history of diabetes mellitus: Secondary | ICD-10-CM | POA: Diagnosis not present

## 2022-11-21 HISTORY — PX: IR NEPHROSTOMY EXCHANGE LEFT: IMG6069

## 2022-11-21 LAB — ECHOCARDIOGRAM COMPLETE
AR max vel: 2.59 cm2
AV Area VTI: 3.02 cm2
AV Area mean vel: 2.58 cm2
AV Mean grad: 6 mmHg
AV Peak grad: 12 mmHg
Ao pk vel: 1.73 m/s
Area-P 1/2: 3.85 cm2
Height: 59 in
MV VTI: 3.32 cm2
S' Lateral: 2.3 cm
Weight: 2880 oz

## 2022-11-21 LAB — TYPE AND SCREEN: Unit division: 0

## 2022-11-21 LAB — BASIC METABOLIC PANEL
Anion gap: 9 (ref 5–15)
BUN: 18 mg/dL (ref 8–23)
CO2: 22 mmol/L (ref 22–32)
Calcium: 8.3 mg/dL — ABNORMAL LOW (ref 8.9–10.3)
Chloride: 106 mmol/L (ref 98–111)
Creatinine, Ser: 0.85 mg/dL (ref 0.44–1.00)
GFR, Estimated: >60 mL/min (ref >60)
Glucose, Bld: 112 mg/dL — ABNORMAL HIGH (ref 70–99)
Potassium: 3.4 mmol/L — ABNORMAL LOW (ref 3.5–5.1)
Sodium: 137 mmol/L (ref 135–145)

## 2022-11-21 LAB — CORTISOL-AM, BLOOD: Cortisol - AM: 11.7 ug/dL (ref 6.7–22.6)

## 2022-11-21 LAB — HEMOGLOBIN AND HEMATOCRIT, BLOOD
HCT: 22.5 % — ABNORMAL LOW (ref 36.0–46.0)
HCT: 27.4 % — ABNORMAL LOW (ref 36.0–46.0)
Hemoglobin: 6.6 g/dL — ABNORMAL LOW (ref 12.0–15.0)
Hemoglobin: 8.2 g/dL — ABNORMAL LOW (ref 12.0–15.0)

## 2022-11-21 LAB — CBC
HCT: 23.3 % — ABNORMAL LOW (ref 36.0–46.0)
Hemoglobin: 6.8 g/dL — ABNORMAL LOW (ref 12.0–15.0)
MCH: 25.2 pg — ABNORMAL LOW (ref 26.0–34.0)
MCHC: 29.2 g/dL — ABNORMAL LOW (ref 30.0–36.0)
MCV: 86.3 fL (ref 80.0–100.0)
Platelets: 267 10*3/uL (ref 150–400)
RBC: 2.7 MIL/uL — ABNORMAL LOW (ref 3.87–5.11)
RDW: 19.1 % — ABNORMAL HIGH (ref 11.5–15.5)
WBC: 3.9 10*3/uL — ABNORMAL LOW (ref 4.0–10.5)
nRBC: 0 % (ref 0.0–0.2)

## 2022-11-21 LAB — PROCALCITONIN: Procalcitonin: <0.1 ng/mL

## 2022-11-21 LAB — TROPONIN I (HIGH SENSITIVITY)
Troponin I (High Sensitivity): 12 ng/L (ref <18)
Troponin I (High Sensitivity): 12 ng/L (ref <18)

## 2022-11-21 LAB — CULTURE, BLOOD (SINGLE): Special Requests: ADEQUATE

## 2022-11-21 LAB — PROTIME-INR
INR: 1.1 (ref 0.8–1.2)
Prothrombin Time: 14.9 seconds (ref 11.4–15.2)

## 2022-11-21 LAB — PREPARE RBC (CROSSMATCH)

## 2022-11-21 LAB — LACTIC ACID, PLASMA: Lactic Acid, Venous: 2 mmol/L (ref 0.5–1.9)

## 2022-11-21 MED ORDER — FUROSEMIDE 20 MG PO TABS
20.0000 mg | ORAL_TABLET | Freq: Every day | ORAL | Status: DC
  Administered 2022-11-21 – 2022-11-23 (×3): 20 mg via ORAL
  Filled 2022-11-21 (×3): qty 1

## 2022-11-21 MED ORDER — VITAMIN C 500 MG PO TABS
1000.0000 mg | ORAL_TABLET | Freq: Every day | ORAL | Status: DC
  Administered 2022-11-21 – 2022-11-23 (×3): 1000 mg via ORAL
  Filled 2022-11-21 (×3): qty 2

## 2022-11-21 MED ORDER — FLUTICASONE FUROATE-VILANTEROL 100-25 MCG/ACT IN AEPB
1.0000 | INHALATION_SPRAY | Freq: Every day | RESPIRATORY_TRACT | Status: DC
  Administered 2022-11-21 – 2022-11-23 (×3): 1 via RESPIRATORY_TRACT
  Filled 2022-11-21: qty 28

## 2022-11-21 MED ORDER — LACTATED RINGERS IV BOLUS (SEPSIS)
1000.0000 mL | Freq: Once | INTRAVENOUS | Status: AC
  Administered 2022-11-21: 1000 mL via INTRAVENOUS

## 2022-11-21 MED ORDER — ALBUTEROL SULFATE HFA 108 (90 BASE) MCG/ACT IN AERS: 2.0000 | INHALATION_SPRAY | Freq: Four times a day (QID) | RESPIRATORY_TRACT | Status: DC | PRN

## 2022-11-21 MED ORDER — ENOXAPARIN SODIUM 40 MG/0.4ML IJ SOSY
40.0000 mg | PREFILLED_SYRINGE | INTRAMUSCULAR | Status: DC
  Administered 2022-11-21 – 2022-11-23 (×3): 40 mg via SUBCUTANEOUS
  Filled 2022-11-21 (×3): qty 0.4

## 2022-11-21 MED ORDER — ONDANSETRON HCL 4 MG PO TABS: 4.0000 mg | ORAL_TABLET | Freq: Four times a day (QID) | ORAL | Status: DC | PRN

## 2022-11-21 MED ORDER — TRAZODONE HCL 50 MG PO TABS: 25.0000 mg | ORAL_TABLET | Freq: Every evening | ORAL | Status: DC | PRN

## 2022-11-21 MED ORDER — MAGNESIUM HYDROXIDE 400 MG/5ML PO SUSP: 30.0000 mL | Freq: Every day | ORAL | Status: DC | PRN

## 2022-11-21 MED ORDER — ROSUVASTATIN CALCIUM 10 MG PO TABS
10.0000 mg | ORAL_TABLET | Freq: Every day | ORAL | Status: DC
  Administered 2022-11-21 – 2022-11-23 (×3): 10 mg via ORAL
  Filled 2022-11-21 (×3): qty 1

## 2022-11-21 MED ORDER — SODIUM CHLORIDE 0.9 % IV SOLN: 2.0000 g | INTRAVENOUS | Status: DC

## 2022-11-21 MED ORDER — LACTATED RINGERS IV BOLUS (SEPSIS)
500.0000 mL | Freq: Once | INTRAVENOUS | Status: AC
  Administered 2022-11-21: 500 mL via INTRAVENOUS

## 2022-11-21 MED ORDER — ALBUTEROL SULFATE (2.5 MG/3ML) 0.083% IN NEBU: 2.5000 mg | INHALATION_SOLUTION | Freq: Four times a day (QID) | RESPIRATORY_TRACT | Status: DC | PRN

## 2022-11-21 MED ORDER — SODIUM CHLORIDE 0.9% IV SOLUTION: Freq: Once | INTRAVENOUS | Status: AC

## 2022-11-21 MED ORDER — ONDANSETRON HCL 4 MG/2ML IJ SOLN: 4.0000 mg | Freq: Four times a day (QID) | INTRAMUSCULAR | Status: DC | PRN

## 2022-11-21 MED ORDER — LACTATED RINGERS IV SOLN
150.0000 mL/h | INTRAVENOUS | Status: DC
  Administered 2022-11-21: 150 mL/h via INTRAVENOUS

## 2022-11-21 MED ORDER — PANTOPRAZOLE SODIUM 40 MG PO TBEC
40.0000 mg | DELAYED_RELEASE_TABLET | Freq: Two times a day (BID) | ORAL | Status: DC
  Administered 2022-11-21 – 2022-11-23 (×5): 40 mg via ORAL
  Filled 2022-11-21 (×5): qty 1

## 2022-11-21 MED ORDER — ACETAMINOPHEN 325 MG PO TABS: 650.0000 mg | ORAL_TABLET | Freq: Four times a day (QID) | ORAL | Status: DC | PRN

## 2022-11-21 MED ORDER — FERROUS SULFATE 325 (65 FE) MG PO TABS
325.0000 mg | ORAL_TABLET | Freq: Every day | ORAL | Status: DC
  Administered 2022-11-21 – 2022-11-23 (×3): 325 mg via ORAL
  Filled 2022-11-21 (×3): qty 1

## 2022-11-21 MED ORDER — MORPHINE SULFATE (PF) 2 MG/ML IV SOLN: 2.0000 mg | INTRAVENOUS | Status: DC | PRN

## 2022-11-21 MED ORDER — LOPERAMIDE HCL 2 MG PO CAPS: 2.0000 mg | ORAL_CAPSULE | ORAL | Status: DC | PRN

## 2022-11-21 MED ORDER — IOHEXOL 300 MG/ML  SOLN
8.0000 mL | Freq: Once | INTRAMUSCULAR | Status: AC | PRN
  Administered 2022-11-21: 8 mL

## 2022-11-21 MED ORDER — MIDODRINE HCL 5 MG PO TABS: 10.0000 mg | ORAL_TABLET | Freq: Three times a day (TID) | ORAL | Status: DC

## 2022-11-21 MED ORDER — SODIUM CHLORIDE 0.9 % IV SOLN
2.0000 g | Freq: Two times a day (BID) | INTRAVENOUS | Status: DC
  Administered 2022-11-21 – 2022-11-22 (×3): 2 g via INTRAVENOUS
  Filled 2022-11-21 (×3): qty 12.5

## 2022-11-21 MED ORDER — ACETAMINOPHEN 650 MG RE SUPP: 650.0000 mg | Freq: Four times a day (QID) | RECTAL | Status: DC | PRN

## 2022-11-21 MED ORDER — ADULT MULTIVITAMIN W/MINERALS CH
1.0000 | ORAL_TABLET | Freq: Every day | ORAL | Status: DC
  Administered 2022-11-21 – 2022-11-23 (×3): 1 via ORAL
  Filled 2022-11-21 (×3): qty 1

## 2022-11-21 MED ORDER — FUROSEMIDE 10 MG/ML IJ SOLN
20.0000 mg | Freq: Once | INTRAMUSCULAR | Status: AC
  Administered 2022-11-21: 20 mg via INTRAVENOUS
  Filled 2022-11-21: qty 4

## 2022-11-21 MED ORDER — LIDOCAINE HCL 1 % IJ SOLN
INTRAMUSCULAR | Status: AC
  Filled 2022-11-21: qty 20

## 2022-11-21 MED ORDER — VANCOMYCIN HCL 750 MG/150ML IV SOLN
750.0000 mg | INTRAVENOUS | Status: DC
  Administered 2022-11-21: 750 mg via INTRAVENOUS
  Filled 2022-11-21: qty 150

## 2022-11-21 MED ORDER — POLYETHYLENE GLYCOL 3350 17 G PO PACK: 17.0000 g | PACK | Freq: Every day | ORAL | Status: DC | PRN

## 2022-11-21 NOTE — Assessment & Plan Note (Addendum)
Repleted and resolved 

## 2022-11-21 NOTE — Assessment & Plan Note (Addendum)
Dyspnea improved with IV Lasix - Continue oral Lasix

## 2022-11-21 NOTE — Progress Notes (Signed)
Transported to Interventional Radiology department with blood transfusing. Handed off to RN.  Courtney Mack

## 2022-11-21 NOTE — Assessment & Plan Note (Addendum)
Continue pantoprazole. °

## 2022-11-21 NOTE — Assessment & Plan Note (Addendum)
Percutaneous nephrostomy tube was exchanged yesterday

## 2022-11-21 NOTE — ED Notes (Signed)
External catheter request by pts RN due to continued incontinence of urine leakage with sacral wound/skin breakdown. Pt with left sided nephrostomy in place.

## 2022-11-21 NOTE — Progress Notes (Signed)
    Patient: Courtney Mack ZOX:[id number] DOB: [birth year]      Brief hospital course: Courtney Mack is a 67 y.o. F with dCHF, obesity, VTE with IVC no longer on AC due to recurrent blood loss anemia, DM, HTN, lymphedema, endometrial CA in remission, and ureteral stricture with PCN who presented with acute shortness of breath.  In the ER, found to have Hgb down to 6, lactic acid 3.8, dimer 1.2.  CTA chest ruled out PE, edema.  Admitted on fluids, antibiotics for sepsis.  Cardiology consulted for chest pain.     This is a no charge note, for further details, please see the H&P by my partner, Dr. Arville Care from earlier today.   Principal Problem:   Sepsis due to gram-negative UTI [facility]) Active Problems:   Acute on chronic anemia   Acute pyelonephritis   Hypokalemia   Chest pain   Dyslipidemia   Chronic diastolic CHF (congestive heart failure) (HCC)   GERD without esophagitis   Elevated lactic acid level    Continue antibiotics for infection IR consulted for PCN exchange, which is due  CT imaging is reassuring of the C/A/P  Will transfuse anemia and appreciate Cardiology assistance of management of her CHF and risk stratification of her chest pain.       Physical Exam: BP 123/66 (BP Location: Right Arm)   Pulse (!) 104   Temp 98.5 F (36.9 C) (Oral)   Resp (!) 25   Ht 4\' 11"  (1.499 m)   Wt 81.6 kg   SpO2 99%   BMI 36.36 kg/m   Patient seen and examined.             Author: Alberteen Sam, MD 11/21/2022 2:53 PM

## 2022-11-21 NOTE — Consult Note (Addendum)
Cardiology Consultation   Patient ID: SHRON BODE MRN: [id number]; DOB: Jan 04, 1956  Admit date: 11/20/2022 Date of Consult: 11/21/2022  PCP:  [facility] Providers Cardiologist:  None        Patient Profile:   Courtney Mack is a 67 y.o. female with a hx of morbid obesity, smoker, DVT and PE February 2022, uterine cancer with radiation and brachytherapy, suction thrombectomy of bilateral pulmonary arteries with IVC filter placement, recurrent bilateral lower extremity DVT, PE June 2022 with thrombectomy, GI bleed December 2022 hemoglobin 3.6, nonbleeding ulcer noted, readmission for melena January 2023 on Xarelto, hemoglobin 4.8, hospitalized February 2023 hemoglobin 4.7, March 2023 urinary tract infection/sepsis left ureteral stent placed Proteus Enterobacter blood cultures, diuresis 21 L who presents with worsening shortness of breath, chest pressure  History of Present Illness:   Courtney Mack reports that she underwent IR left nephrostomy tube exchange Nov 20, 2022.  Presenting via EMS for shortness of breath that developed overnight after ambulation to the bathroom Noted to be 100% oxygen on room air Reports no symptoms the day prior at baseline has difficulty walking secondary to leg weakness, gait instability In the ER started on vancomycin out of concern for urinary tract infection, sepsis Notes indicating diagnosis of COPD during prior admission, has inhalers at home Over the past 24 hours, has received 2.5 L of fluids Moderate shortness of breath this morning when talking on rounds Lactic acid 3.8 D-dimer 1.28, CTA chest with no PE, Blood cultures pending Hemoglobin 8.5, repeat 6.8 this morning, follow-up hemoglobin 6.6 This morning denies chest pain, reports it resolved in the emergency room Troponin negative  Past Medical History:  Diagnosis Date   Acute bilateral deep vein thrombosis (DVT) of femoral veins  (HCC) 01/02/2021   Acute massive pulmonary embolism (HCC) 08/25/2020   Last Assessment & Plan:  Formatting of this note might be different from the original. Post vena caval filter and on xarelto and O2   Anemia    ARF (acute respiratory failure) (HCC)    Cellulitis of left lower leg 06/20/2021   CHF (congestive heart failure) (HCC)    COVID-19    Diabetes mellitus without complication (HCC)    DVT (deep venous thrombosis) (HCC) 09/06/2020   Endometrial cancer (HCC)    GAVE (gastric antral vascular ectasia)    GERD (gastroesophageal reflux disease)    High cholesterol    Hx of blood clots    Hyperlipidemia    Hypertension    IDA (iron deficiency anemia) 09/21/2020   Obesity    Pneumonia 06/11/2022   recovered   Pressure injury of skin 06/21/2021   Pulmonary embolism (HCC)    Sepsis (HCC)    Thrombocytopenia (HCC)    Urinary tract infection 09/13/2021    Past Surgical History:  Procedure Laterality Date   CATARACT EXTRACTION W/PHACO Right 06/04/2022   Procedure: CATARACT EXTRACTION PHACO AND INTRAOCULAR LENS PLACEMENT (IOC) COMPLICATED RIGHT  31.83  02:17.4;  Surgeon: Galen Manila, MD;  Location: [facility];  Service: Ophthalmology;  Laterality: Right;   CATARACT EXTRACTION W/PHACO Left 07/09/2022   Procedure: CATARACT EXTRACTION PHACO AND INTRAOCULAR LENS PLACEMENT (IOC);  Surgeon: Galen Manila, MD;  Location: [facility];  Service: Ophthalmology;  Laterality: Left;  22.75 1:40.3   COLONOSCOPY N/A 06/21/2021   Procedure: COLONOSCOPY;  Surgeon: Toledo, Boykin Nearing, MD;  Location: [facility] END[facility]COPY;  Service: Gastroenterology;  Laterality: N/A;   CYSTOSCOPY W/ URETERAL STENT PLACEMENT Left  09/11/2021   Procedure: CYSTOSCOPY WITH RETROGRADE PYELOGRAM/URETERAL STENT PLACEMENT;  Surgeon: Riki Altes, MD;  Location: [facility] ORS;  Service: Urology;  Laterality: Left;   CYSTOSCOPY W/ URETERAL STENT PLACEMENT Left 11/17/2021   Procedure: CYSTOSCOPY WITH STENT  REPLACEMENT;  Surgeon: Bjorn Pippin, MD;  Location: [facility] OR[facility];  Service: Urology;  Laterality: Left;   CYSTOSCOPY WITH FULGERATION  11/17/2021   Procedure: CYSTOSCOPY WITH FULGERATION;  Surgeon: Bjorn Pippin, MD;  Location: [facility] OR[facility];  Service: Urology;;   CYSTOSCOPY WITH STENT PLACEMENT Left 10/17/2021   Procedure: CYSTOSCOPY WITH STENT PLACEMENT;  Surgeon: Riki Altes, MD;  Location: [facility];  Service: Urology;  Laterality: Left;   CYSTOSCOPY WITH URETEROSCOPY, STONE BASKETRY AND STENT PLACEMENT Left 10/16/2021   Procedure: CYSTOSCOPY WITH URETEROSCOPY  AND STENT REMOVAL;  Surgeon: Riki Altes, MD;  Location: [facility];  Service: Urology;  Laterality: Left;   ENTEROSCOPY N/A 08/17/2021   Procedure: ENTEROSCOPY;  Surgeon: Toney Reil, MD;  Location: [facility] ENDOSCOPY;  Service: Gastroenterology;  Laterality: N/A;   ESOPHAGOGASTRODUODENOSCOPY N/A 06/21/2021   Procedure: ESOPHAGOGASTRODUODENOSCOPY (EGD);  Surgeon: Toledo, Boykin Nearing, MD;  Location: [facility];  Service: Gastroenterology;  Laterality: N/A;   ESOPHAGOGASTRODUODENOSCOPY (EGD) WITH PROPOFOL N/A 08/17/2021   Procedure: ESOPHAGOGASTRODUODENOSCOPY (EGD) WITH PROPOFOL;  Surgeon: Toney Reil, MD;  Location: [facility];  Service: Gastroenterology;  Laterality: N/A;   ESOPHAGOGASTRODUODENOSCOPY (EGD) WITH PROPOFOL N/A 10/14/2021   Procedure: ESOPHAGOGASTRODUODENOSCOPY (EGD) WITH PROPOFOL;  Surgeon: Midge Minium, MD;  Location: [facility] END[facility];  Service: Endoscopy;  Laterality: N/A;   ESOPHAGOGASTRODUODENOSCOPY (EGD) WITH PROPOFOL N/A 02/01/2022   Procedure: ESOPHAGOGASTRODUODENOSCOPY (EGD) WITH PROPOFOL;  Surgeon: Toney Reil, MD;  Location: [facility] ENDOSCOPY;  Service: Gastroenterology;  Laterality: N/A;   ESOPHAGOGASTRODUODENOSCOPY (EGD) WITH PROPOFOL N/A 03/15/2022   Procedure: ESOPHAGOGASTRODUODENOSCOPY (EGD) WITH PROPOFOL;  Surgeon: Wyline Mood, MD;  Location: [facility] ENDOSCOPY;  Service: Gastroenterology;  Laterality:  N/A;   ESOPHAGOGASTRODUODENOSCOPY (EGD) WITH PROPOFOL N/A 04/18/2022   Procedure: ESOPHAGOGASTRODUODENOSCOPY (EGD) WITH PROPOFOL;  Surgeon: Toney Reil, MD;  Location: [facility] ENDOSCOPY;  Service: Gastroenterology;  Laterality: N/A;   ESOPHAGOGASTRODUODENOSCOPY (EGD) WITH PROPOFOL N/A 08/09/2022   Procedure: ESOPHAGOGASTRODUODENOSCOPY (EGD) WITH PROPOFOL;  Surgeon: Wyline Mood, MD;  Location: [facility];  Service: Gastroenterology;  Laterality: N/A;   GIVENS CAPSULE STUDY N/A 06/22/2021   Procedure: GIVENS CAPSULE STUDY;  Surgeon: Toledo, Boykin Nearing, MD;  Location: [facility] ENDOSCOPY;  Service: Gastroenterology;  Laterality: N/A;   IR NEPHROSTOMY EXCHANGE LEFT  02/25/2022   IR NEPHROSTOMY EXCHANGE LEFT  03/27/2022   IR NEPHROSTOMY EXCHANGE LEFT  05/22/2022   IR NEPHROSTOMY EXCHANGE LEFT  07/24/2022   IR NEPHROSTOMY EXCHANGE LEFT  09/24/2022   IR NEPHROSTOMY PLACEMENT LEFT  12/27/2021   IVC FILTER INSERTION N/A 08/18/2020   Procedure: IVC FILTER INSERTION;  Surgeon: Renford Dills, MD;  Location: [facility];  Service: Cardiovascular;  Laterality: N/A;   PERIPHERAL VASCULAR THROMBECTOMY Bilateral 01/03/2021   Procedure: PERIPHERAL VASCULAR THROMBECTOMY;  Surgeon: Annice Needy, MD;  Location: [facility];  Service: Cardiovascular;  Laterality: Bilateral;   PULMONARY THROMBECTOMY N/A 08/18/2020   Procedure: PULMONARY THROMBECTOMY;  Surgeon: Renford Dills, MD;  Location: [facility];  Service: Cardiovascular;  Laterality: N/A;   TUBAL LIGATION     VISCERAL ANGIOGRAPHY N/A 07/18/2021   Procedure: VISCERAL ANGIOGRAPHY;  Surgeon: Annice Needy, MD;  Location: [facility];  Service: Cardiovascular;  Laterality: N/A;    No current facility-administered medications on file prior  to encounter.   Current Outpatient Medications on File Prior to Encounter  Medication Sig Dispense Refill   acetaminophen (TYLENOL) 500 MG tablet Take 500 mg by mouth every 6 (six) hours  as needed for mild pain or fever.     albuterol (VENTOLIN HFA) 108 (90 Base) MCG/ACT inhaler Inhale 2 puffs into the lungs every 6 (six) hours as needed for wheezing or shortness of breath. 8 g 0   Ascorbic Acid (VITAMIN C) 1000 MG tablet Take 1,000 mg by mouth daily.     FEROSUL 325 (65 Fe) MG tablet Take 325 mg by mouth daily with breakfast.     fluticasone furoate-vilanterol (BREO ELLIPTA) 100-25 MCG/ACT AEPB Inhale 1 puff into the lungs daily.     furosemide (LASIX) 20 MG tablet Take 1 tablet (20 mg total) by mouth daily. Home med. 30 tablet 3   loperamide (IMODIUM) 2 MG capsule Take 2 mg by mouth as needed for diarrhea or loose stools.     Multiple Vitamin (MULTIVITAMIN WITH MINERALS) TABS tablet Take 1 tablet by mouth daily.     pantoprazole (PROTONIX) 40 MG tablet Take 1 tablet (40 mg total) by mouth 2 (two) times daily. 60 tablet 1   rosuvastatin (CRESTOR) 10 MG tablet Take 1 tablet (10 mg total) by mouth daily. 15 tablet 0   midodrine (PROAMATINE) 10 MG tablet Take 10 mg by mouth 3 (three) times daily. (Patient not taking: Reported on 11/21/2022)     polyethylene glycol (MIRALAX / GLYCOLAX) 17 g packet Take 17 g by mouth daily as needed for mild constipation. (Patient not taking: Reported on 11/21/2022) 14 each 0    Inpatient Medications: Scheduled Meds:  vitamin C  1,000 mg Oral Daily   enoxaparin (LOVENOX) injection  40 mg Subcutaneous Q24H   ferrous sulfate  325 mg Oral Q breakfast   fluticasone furoate-vilanterol  1 puff Inhalation Daily   furosemide  20 mg Oral Daily   multivitamin with minerals  1 tablet Oral Daily   pantoprazole  40 mg Oral BID   rosuvastatin  10 mg Oral Daily   Continuous Infusions:  ceFEPime (MAXIPIME) IV 2 g (11/21/22 0906)   PRN Meds: acetaminophen **OR** acetaminophen, albuterol, loperamide, magnesium hydroxide, morphine injection, ondansetron **OR** ondansetron (ZOFRAN) IV, polyethylene glycol, traZODone  Allergies:   No Known Allergies  Social  History:   Social History   Socioeconomic History   Marital status: Single    Spouse name: Not on file   Number of children: Not on file   Years of education: Not on file   Highest education level: Not on file  Occupational History   Not on file  Tobacco Use   Smoking status: Never   Smokeless tobacco: Never   Tobacco comments:    smoked 2-4 cigarettes for about 2-3 weeks   Vaping Use   Vaping Use: Never used  Substance and Sexual Activity   Alcohol use: Not Currently   Drug use: Never   Sexual activity: Not Currently  Other Topics Concern   Not on file  Social History Narrative   Not on file   Social Determinants of Health   Financial Resource Strain: Not on file  Food Insecurity: No Food Insecurity (11/21/2022)   Hunger Vital Sign    Worried About Running Out of Food in the Last Year: Never true    Ran Out of Food in the Last Year: Never true  Transportation Needs: No Transportation Needs (11/21/2022)   PRAPARE - Transportation  Lack of Transportation (Medical): No    Lack of Transportation (Non-Medical): No  Physical Activity: Not on file  Stress: Not on file  Social Connections: Not on file  Intimate Partner Violence: Not At Risk (11/21/2022)   Humiliation, Afraid, Rape, and Kick questionnaire    Fear of Current or Ex-Partner: No    Emotionally Abused: No    Physically Abused: No    Sexually Abused: No    Family History:    Family History  Problem Relation Age of Onset   Hypertension Mother    Diabetes Mother    Diabetes Father    Hypertension Father    High Cholesterol Father    Congestive Heart Failure Father    Breast cancer Cousin      ROS:  Please see the history of present illness.  Review of Systems  Constitutional: Negative.   HENT: Negative.    Respiratory:  Positive for shortness of breath.   Cardiovascular:  Positive for chest pain.  Gastrointestinal: Negative.   Musculoskeletal: Negative.   Neurological: Negative.    Psychiatric/Behavioral: Negative.    All other systems reviewed and are negative.   Physical Exam/Data:   Vitals:   11/21/22 0351 11/21/22 0832 11/21/22 1227 11/21/22 1244  BP: (!) 126/46 (!) 122/41 (!) 112/43 103/65  Pulse: 90 84 99 94  Resp: 18 18 18 18   Temp: 98.3 F (36.8 C) 98.3 F (36.8 C) 98.2 F (36.8 C) 98.5 F (36.9 C)  TempSrc: Oral Oral Oral Oral  SpO2: 96% 99% 98% 98%  Weight:      Height:        Intake/Output Summary (Last 24 hours) at 11/21/2022 1349 Last data filed at 11/21/2022 1227 Gross per 24 hour  Intake 4519.33 ml  Output 1665 ml  Net 2854.33 ml      11/20/2022    6:05 PM 11/07/2022   11:38 AM 10/24/2022   10:01 AM  Last 3 Weights  Weight (lbs) 180 lb 174 lb 3.2 oz 176 lb 12.9 oz  Weight (kg) 81.647 kg 79.017 kg 80.2 kg     Body mass index is 36.36 kg/m.  General:  Well nourished, well developed, in no acute distress HEENT: normal Neck: no JVD Vascular: No carotid bruits; Distal pulses 2+ bilaterally Cardiac:  normal S1, S2; RRR; no murmur  Lungs:  clear to auscultation bilaterally, no wheezing, rhonchi or rales  Abd: soft, nontender, no hepatomegaly  Ext: no edema Musculoskeletal:  No deformities, BUE and BLE strength normal and equal Skin: warm and dry  Neuro:  CNs 2-12 intact, no focal abnormalities noted Psych:  Normal affect   EKG:  The EKG was personally reviewed and demonstrates:   Normal sinus rhythm rate 95 bpm no significant ST-T wave changes  Telemetry:  Telemetry was personally reviewed and demonstrates:   Normal sinus rhythm  Relevant CV Studies: Echocardiogram performed today   1. Left ventricular ejection fraction, by estimation, is 60 to 65%. The  left ventricle has normal function. The left ventricle has no regional  wall motion abnormalities. Left ventricular diastolic parameters are  consistent with Grade I diastolic  dysfunction (impaired relaxation).   2. Right ventricular systolic function is normal. The right  ventricular  size is normal. There is normal pulmonary artery systolic pressure. The  estimated right ventricular systolic pressure is 17.6 mmHg.   3. The mitral valve is normal in structure. No evidence of mitral valve  regurgitation. No evidence of mitral stenosis.   4. The aortic  valve has an indeterminant number of cusps. Aortic valve  regurgitation is not visualized. No aortic stenosis is present.   5. The inferior vena cava is normal in size with greater than 50%  respiratory variability, suggesting right atrial pressure of 3 mmHg.   Laboratory Data:  High Sensitivity Troponin:   Recent Labs  Lab 10/24/22 1215 11/20/22 1809 11/20/22 2141 11/21/22 0304 11/21/22 0526  TROPONINIHS 10 8 12 12 12      Chemistry Recent Labs  Lab 11/20/22 2141 11/21/22 0304  NA 138 137  K 3.3* 3.4*  CL 103 106  CO2 24 22  GLUCOSE 146* 112*  BUN 19 18  CREATININE 0.94 0.85  CALCIUM 8.9 8.3*  GFRNONAA >60 >60  ANIONGAP 11 9    Recent Labs  Lab 11/20/22 2141  PROT 7.1  ALBUMIN 3.4*  AST 25  ALT 12  ALKPHOS 51  BILITOT 1.0   Lipids No results for input(s): "CHOL", "TRIG", "HDL", "LABVLDL", "LDLCALC", "CHOLHDL" in the last 168 hours.  Hematology Recent Labs  Lab 11/20/22 1809 11/21/22 0304 11/21/22 0526  WBC 4.3 3.9*  --   RBC 3.37* 2.70*  --   HGB 8.5* 6.8* 6.6*  HCT 29.5* 23.3* 22.5*  MCV 87.5 86.3  --   MCH 25.2* 25.2*  --   MCHC 28.8* 29.2*  --   RDW 19.1* 19.1*  --   PLT 303 267  --    Thyroid No results for input(s): "TSH", "FREET4" in the last 168 hours.  BNP Recent Labs  Lab 11/20/22 2141  BNP 41.4    DDimer  Recent Labs  Lab 11/20/22 2141  DDIMER 1.28*     Radiology/Studies:  ECHOCARDIOGRAM COMPLETE  Result Date: 11/21/2022    ECHOCARDIOGRAM REPORT   Patient Name:   Courtney Mack Date of Exam: 11/21/2022 Medical Rec #:  [id number]          Height:       59.0 in Accession #:    [id number]         Weight:       180.0 lb Date of Birth:  01-19-1956           BSA:          1.764 m Patient Age:    66 years           BP:           122/41 mmHg Patient Gender: F                  HR:           96 bpm. Exam Location:  [facility] Procedure: 2D Echo, Cardiac Doppler and Color Doppler Indications:     Chet Pain  History:         Patient has prior history of Echocardiogram examinations, most                  recent 02/25/2022. CHF, COPD, Signs/Symptoms:Chest Pain, Dyspnea                  and Bacteremia; Risk Factors:Diabetes, Dyslipidemia,                  Hypertension and Sleep Apnea.  Sonographer:     Mikki Harbor Referring Phys:  [id number] Sondra Barges Diagnosing Phys: Julien Nordmann MD  Sonographer Comments: Technically difficult study due to poor echo windows. Image acquisition challenging due to COPD and Image acquisition challenging due to respiratory motion. IMPRESSIONS  1. Left ventricular ejection fraction, by estimation, is 60 to 65%. The left ventricle has normal function. The left ventricle has no regional wall motion abnormalities. Left ventricular diastolic parameters are consistent with Grade I diastolic dysfunction (impaired relaxation).  2. Right ventricular systolic function is normal. The right ventricular size is normal. There is normal pulmonary artery systolic pressure. The estimated right ventricular systolic pressure is 17.6 mmHg.  3. The mitral valve is normal in structure. No evidence of mitral valve regurgitation. No evidence of mitral stenosis.  4. The aortic valve has an indeterminant number of cusps. Aortic valve regurgitation is not visualized. No aortic stenosis is present.  5. The inferior vena cava is normal in size with greater than 50% respiratory variability, suggesting right atrial pressure of 3 mmHg. FINDINGS  Left Ventricle: Left ventricular ejection fraction, by estimation, is 60 to 65%. The left ventricle has normal function. The left ventricle has no regional wall motion abnormalities. The left ventricular internal cavity size was  normal in size. There is  no left ventricular hypertrophy. Left ventricular diastolic parameters are consistent with Grade I diastolic dysfunction (impaired relaxation). Right Ventricle: The right ventricular size is normal. No increase in right ventricular wall thickness. Right ventricular systolic function is normal. There is normal pulmonary artery systolic pressure. The tricuspid regurgitant velocity is 1.91 m/s, and  with an assumed right atrial pressure of 3 mmHg, the estimated right ventricular systolic pressure is 17.6 mmHg. Left Atrium: Left atrial size was normal in size. Right Atrium: Right atrial size was normal in size. Pericardium: There is no evidence of pericardial effusion. Mitral Valve: The mitral valve is normal in structure. No evidence of mitral valve regurgitation. No evidence of mitral valve stenosis. MV peak gradient, 8.6 mmHg. The mean mitral valve gradient is 3.0 mmHg. Tricuspid Valve: The tricuspid valve is normal in structure. Tricuspid valve regurgitation is not demonstrated. No evidence of tricuspid stenosis. Aortic Valve: The aortic valve has an indeterminant number of cusps. Aortic valve regurgitation is not visualized. No aortic stenosis is present. Aortic valve mean gradient measures 6.0 mmHg. Aortic valve peak gradient measures 12.0 mmHg. Aortic valve area, by VTI measures 3.02 cm. Pulmonic Valve: The pulmonic valve was normal in structure. Pulmonic valve regurgitation is not visualized. No evidence of pulmonic stenosis. Aorta: The aortic root is normal in size and structure. Venous: The inferior vena cava is normal in size with greater than 50% respiratory variability, suggesting right atrial pressure of 3 mmHg. IAS/Shunts: No atrial level shunt detected by color flow Doppler.  LEFT VENTRICLE PLAX 2D LVIDd:         4.00 cm   Diastology LVIDs:         2.30 cm   LV e' medial:    7.29 cm/s LV PW:         1.20 cm   LV E/e' medial:  9.7 LV IVS:        1.10 cm   LV e' lateral:   8.70  cm/s LVOT diam:     1.90 cm   LV E/e' lateral: 8.1 LV SV:         93 LV SV Index:   53 LVOT Area:     2.84 cm  RIGHT VENTRICLE RV Basal diam:  3.20 cm RV Mid diam:    2.80 cm RV S prime:     16.60 cm/s LEFT ATRIUM             Index  RIGHT ATRIUM          Index LA diam:        3.80 cm 2.15 cm/m   RA Area:     9.54 cm LA Vol (A2C):   59.7 ml 33.85 ml/m  RA Volume:   15.70 ml 8.90 ml/m LA Vol (A4C):   36.6 ml 20.75 ml/m LA Biplane Vol: 49.2 ml 27.90 ml/m  AORTIC VALVE                     PULMONIC VALVE AV Area (Vmax):    2.59 cm      PV Vmax:       1.23 m/s AV Area (Vmean):   2.58 cm      PV Peak grad:  6.1 mmHg AV Area (VTI):     3.02 cm AV Vmax:           173.00 cm/s AV Vmean:          111.000 cm/s AV VTI:            0.308 m AV Peak Grad:      12.0 mmHg AV Mean Grad:      6.0 mmHg LVOT Vmax:         158.00 cm/s LVOT Vmean:        101.000 cm/s LVOT VTI:          0.328 m LVOT/AV VTI ratio: 1.06  AORTA Ao Root diam: 2.60 cm MITRAL VALVE                TRICUSPID VALVE MV Area (PHT): 3.85 cm     TR Peak grad:   14.6 mmHg MV Area VTI:   3.32 cm     TR Vmax:        191.00 cm/s MV Peak grad:  8.6 mmHg MV Mean grad:  3.0 mmHg     SHUNTS MV Vmax:       1.47 m/s     Systemic VTI:  0.33 m MV Vmean:      81.0 cm/s    Systemic Diam: 1.90 cm MV Decel Time: 197 msec MV E velocity: 70.40 cm/s MV A velocity: 141.00 cm/s MV E/A ratio:  0.50 Julien Nordmann MD Electronically signed by Julien Nordmann MD Signature Date/Time: 11/21/2022/12:21:09 PM    Final    CT ABDOMEN PELVIS W CONTRAST  Result Date: 11/21/2022 CLINICAL DATA:  Abdominal pain, acute, nonlocalized EXAM: CT ABDOMEN AND PELVIS WITH CONTRAST TECHNIQUE: Multidetector CT imaging of the abdomen and pelvis was performed using the standard protocol following bolus administration of intravenous contrast. RADIATION DOSE REDUCTION: This exam was performed according to the departmental dose-optimization program which includes automated exposure control, adjustment  of the mA and/or kV according to patient size and/or use of iterative reconstruction technique. CONTRAST:  75mL OMNIPAQUE IOHEXOL 350 MG/ML SOLN COMPARISON:  CT angio chest 11/20/2022, CT abdomen pelvis 03/26/2022 FINDINGS: Lower chest: Please see separately dictated CT angio chest 11/20/2022. Hepatobiliary: The liver is mildly enlarged measuring up to 18 cm. No focal liver abnormality. No gallstones, gallbladder wall thickening, or pericholecystic fluid. No biliary dilatation. Pancreas: Diffusely atrophic. No focal lesion. Otherwise normal pancreatic contour. No surrounding inflammatory changes. No main pancreatic ductal dilatation. Spleen: Normal in size without focal abnormality. Adrenals/Urinary Tract: No adrenal nodule bilaterally. Left percutaneous nephrostomy tube with pigtail terminating within the renal pelvis. Bilateral kidneys enhance symmetrically. No hydronephrosis. No hydroureter. 2 mm right nephrolithiasis. No left nephrolithiasis. Mild urothelial thickening of bilateral collecting  system with slightly hazy urinary bladder wall contour and mild perivesicular fat stranding. The urinary bladder is unremarkable. On delayed imaging, there is no urothelial wall thickening and there are no filling defects in the opacified portions of the bilateral collecting systems or ureters. Stomach/Bowel: Stomach is within normal limits. No evidence of bowel wall thickening or dilatation. Under distended colon. Appendix appears normal. Vascular/Lymphatic: Inferior vena cava filter in appropriate position. No abdominal aorta or iliac aneurysm. Moderate atherosclerotic plaque of the aorta and its branches. No abdominal, pelvic, or inguinal lymphadenopathy. Reproductive: Uterus and bilateral adnexa are unremarkable. Other: No intraperitoneal free fluid. No intraperitoneal free gas. No organized fluid collection. Musculoskeletal: No abdominal wall hernia or abnormality. Diffuse subcutaneus soft tissue edema. No suspicious  lytic or blastic osseous lesions. No acute displaced fracture. Severe degenerative changes of the bilateral hips. IMPRESSION: 1. Findings suggestive of bilateral collecting system and urinary bladder infection. Correlate with urinalysis. 2. Left nephrostomy tube in appropriate position. 3. Nonobstructive 2 mm right nephrolithiasis. 4. Mild hepatomegaly. 5. Severe bilateral degenerative changes of the hips. 6.  Aortic Atherosclerosis (ICD10-I70.0). Electronically Signed   By: Tish Frederickson M.D.   On: 11/21/2022 00:35   CT Angio Chest PE W and/or Wo Contrast  Result Date: 11/21/2022 CLINICAL DATA:  Pulmonary embolism (PE) suspected, high prob. Shortness of breath EXAM: CT ANGIOGRAPHY CHEST WITH CONTRAST TECHNIQUE: Multidetector CT imaging of the chest was performed using the standard protocol during bolus administration of intravenous contrast. Multiplanar CT image reconstructions and MIPs were obtained to evaluate the vascular anatomy. RADIATION DOSE REDUCTION: This exam was performed according to the departmental dose-optimization program which includes automated exposure control, adjustment of the mA and/or kV according to patient size and/or use of iterative reconstruction technique. CONTRAST:  75mL OMNIPAQUE IOHEXOL 350 MG/ML SOLN COMPARISON:  10/24/2022 FINDINGS: Cardiovascular: No filling defects in the pulmonary arteries to suggest pulmonary emboli. Heart is normal size. Aorta is normal caliber. Scattered aortic calcifications. Mediastinum/Nodes: No mediastinal, hilar, or axillary adenopathy. Trachea and esophagus are unremarkable. Thyroid unremarkable. Lungs/Pleura: No confluent airspace opacities or effusions. Upper Abdomen: No acute findings in the upper abdomen. Musculoskeletal: Chest wall soft tissues are unremarkable. No acute bony abnormality. Review of the MIP images confirms the above findings. IMPRESSION: No evidence of pulmonary embolus. No acute cardiopulmonary disease. Aortic Atherosclerosis  (ICD10-I70.0). Electronically Signed   By: Charlett Nose M.D.   On: 11/21/2022 00:07   DG Chest 2 View  Result Date: 11/20/2022 CLINICAL DATA:  Shortness of breath EXAM: CHEST - 2 VIEW COMPARISON:  Chest radiograph dated 10/24/2022 FINDINGS: Normal lung volumes. No focal consolidations. No pleural effusion or pneumothorax. The heart size and mediastinal contours are within normal limits. No acute osseous abnormality. Partially imaged IVC filter. IMPRESSION: No active cardiopulmonary disease. Electronically Signed   By: Agustin Cree M.D.   On: 11/20/2022 18:57     Assessment and Plan:   Shortness of breath/chest pressure In the setting of anemia,, morbid obesity, COPD  hemoglobin this morning 6.6, partly dilutional after 2.5 L fluid in the ER.  Baseline hemoglobin in the 7-8 range Prior history of GI bleeds, ulcers, intolerance of anticoagulation -Echocardiogram confirming normal ejection fraction, no indication of elevated right heart pressures or RV failure -Cardiac CTA with no PE, no significant pleural effusion -Agree with transfusion 1 to 2 units for symptom relief -Notes indicating recent hospital admission November 03, 2022 with COPD exacerbation, at that time treated with prednisone and Zithromax with steroid taper notes indicating 3.9 L  positive will give dose of IV Lasix May need additional dose IV Lasix after transfusion of blood  Chest pain Symptoms in the setting of anemia, underlying lung disease, atypical in nature Cardiac enzymes negative Echocardiogram with normal ejection fraction and no focal wall motion abnormality No further ischemic workup needed at this time, poor candidate in the setting of anemia -No indication for heparin infusion Consider outpatient ischemic workup if clinically indicated  History of chronic diastolic CHF Over 3 L positive since admission , Dose of IV Lasix x 1 will be given this morning  Anemia History of iron deficiency, chronic anemia Consider  iron infusion    Total encounter time more than 80 minutes  Greater than 50% was spent in counseling and coordination of care with the patient   For questions or updates, please contact [facility] Please consult [email or web address] for contact info under    Signed, Julien Nordmann, MD  11/21/2022 1:49 PM

## 2022-11-21 NOTE — ED Notes (Signed)
Courtney Mack- dtr called at this time with update, per her request.

## 2022-11-21 NOTE — Hospital Course (Signed)
Mrs. Quist is a 67 y.o. F with dCHF, obesity, VTE with IVC no longer on AC due to recurrent blood loss anemia, DM, HTN, lymphedema, endometrial CA in remission, and ureteral stricture with PCN who presented with acute shortness of breath.  In the ER, found to have Hgb down to 6, lactic acid 3.8, dimer 1.2.  CTA chest ruled out PE, edema.  Admitted on fluids, antibiotics for sepsis.  Cardiology consulted for chest pain.

## 2022-11-21 NOTE — Progress Notes (Signed)
Patient arrived to IR Lab with bedside nurse Velvet, RN and transport team. Consent obtained by IR Team.  Patient carefully placed in prone position on IR Lab table and Dr. Payton Mccallum Abd changed nephrostomy tube (see IR note for details).  Patient remained awake alert and talking appropriately during procedure.  See Flowsheet for VS during procedure. IR Team placed dressing over new nephrostomy tube and patient carefully placed back onto in-patient bed in supine position.  Patient arrived to IR Lab with PRBCs infusing and continued throughout procedure and continue upon return to in-patient room with myself, G. Zakariyah Freimark, RN and IR Lab Team member. Returned to room 230 and face-to-face report and assessment of site done with Annett Fabian, RN at bedside.

## 2022-11-21 NOTE — Assessment & Plan Note (Addendum)
Continue Crestor 

## 2022-11-21 NOTE — H&P (Signed)
[place]   PATIENT NAME: Courtney Mack    MR#:  [id number]  DATE OF BIRTH:  03-Courtney Mack-1957  DATE OF ADMISSION:  11/20/2022  PRIMARY CARE PHYSICIAN: [facility]   Patient is coming from: Home  REQUESTING/REFERRING PHYSICIAN: Artis Delay, MD  CHIEF COMPLAINT:   Chief Complaint  Patient presents with   Shortness of Breath    HISTORY OF PRESENT ILLNESS:  Courtney Mack is a 67 y.o. female with medical history significant for  CHF, PE, DVT, GAVE, GERD, hypertension and dyslipidemia, who presented to the emergency room with concern of shortness of breath started this afternoon with respiratory distress.  She admits to 7/10 midsternal chest pain felt as pressure with associated diaphoresis without nausea or vomiting.  She has been doing well prior to.  She has been having increasing sediment and cloudy urine in her left nephrostomy tube which is due for replacement to.  She was having left flank pain around the nephrostomy tube.  She has been having dyspnea on exertion with ambulation.  She denies any worsening lower extremity edema.  No orthopnea or paroxysmal nocturnal dyspnea.  She admits to diarrhea for the last 3 days for which she has been taking Imodium.  No melena or bright red bleeding per rectum.  No cough or wheezing or hemoptysis.  She was recently diagnosed with COPD during her last admission and has been using her inhalers.  She is a non-smoker.  No fever or chills.  No headache or dizziness or blurred vision.  The patient has a history of recurrent UTI and grew Enterococcus faecalis as well as Proteus Penneri in the past.  ED Course: Upon presentation to the emergency room, BP was 135/67 with otherwise normal vital signs.  Respiratory rate was later on 21 within 24 and heart rate 101.  Labs revealed mild hypokalemia 3.3 with glucose of 146 and albumin 3.4 and otherwise unremarkable CMP.  CBC showed WBC of 4.3 with hemoglobin 8.5 and hematocrit  29.5 compared to 7.6/25.4 on 10/26/2022.  Lactic acid was 3.8 and later 2 with procalcitonin less than 0.1.  D-dimer came back 1.28.  UA showed 20-50 WBCs but no bacteria moderate leukocytes and negative nitrite.  Blood culture was drawn.  Abdominal and pelvic CT scan revealed the following:  EKG as reviewed by me : EKG showed normal sinus rhythm with a rate of 95 with low voltage QRS Imaging: 1. Findings suggestive of bilateral collecting system and urinary bladder infection. Correlate with urinalysis. 2. Left nephrostomy tube in appropriate position. 3. Nonobstructive 2 mm right nephrolithiasis. 4. Mild hepatomegaly. 5. Severe bilateral degenerative changes of the hips. 6.  Aortic Atherosclerosis  Chest CTA revealed aortic atherosclerosis with no PE or acute cardiopulmonary disease.  The patient was given IV cefepime and vancomycin as well as 1 L bolus of IV normal saline.  She will be admitted to a medical telemetry bed for further evaluation and management. PAST MEDICAL HISTORY:   Past Medical History:  Diagnosis Date   Acute bilateral deep vein thrombosis (DVT) of femoral veins (HCC) 01/02/2021   Acute massive pulmonary embolism (HCC) 08/25/2020   Last Assessment & Plan:  Formatting of this note might be different from the original. Post vena caval filter and on xarelto and O2   Anemia    ARF (acute respiratory failure) (HCC)    Cellulitis of left lower leg 06/20/2021   CHF (congestive heart failure) (HCC)    COVID-19  Diabetes mellitus without complication (HCC)    DVT (deep venous thrombosis) (HCC) 09/06/2020   Endometrial cancer (HCC)    GAVE (gastric antral vascular ectasia)    GERD (gastroesophageal reflux disease)    High cholesterol    Hx of blood clots    Hyperlipidemia    Hypertension    IDA (iron deficiency anemia) 09/21/2020   Obesity    Pneumonia 06/11/2022   recovered   Pressure injury of skin 06/21/2021   Pulmonary embolism (HCC)    Sepsis (HCC)     Thrombocytopenia (HCC)    Urinary tract infection 09/13/2021    PAST SURGICAL HISTORY:   Past Surgical History:  Procedure Laterality Date   CATARACT EXTRACTION W/PHACO Right 06/04/2022   Procedure: CATARACT EXTRACTION PHACO AND INTRAOCULAR LENS PLACEMENT (IOC) COMPLICATED RIGHT  31.83  02:17.4;  Surgeon: Galen Manila, MD;  Location: [facility];  Service: Ophthalmology;  Laterality: Right;   CATARACT EXTRACTION W/PHACO Left 07/09/2022   Procedure: CATARACT EXTRACTION PHACO AND INTRAOCULAR LENS PLACEMENT (IOC);  Surgeon: Galen Manila, MD;  Location: [facility];  Service: Ophthalmology;  Laterality: Left;  22.75 1:40.3   COLONOSCOPY N/A 06/21/2021   Procedure: COLONOSCOPY;  Surgeon: Toledo, Boykin Nearing, MD;  Location: [facility];  Service: Gastroenterology;  Laterality: N/A;   CYSTOSCOPY W/ URETERAL STENT PLACEMENT Left 09/11/2021   Procedure: CYSTOSCOPY WITH RETROGRADE PYELOGRAM/URETERAL STENT PLACEMENT;  Surgeon: Riki Altes, MD;  Location: [facility] ORS;  Service: Urology;  Laterality: Left;   CYSTOSCOPY W/ URETERAL STENT PLACEMENT Left 11/17/2021   Procedure: CYSTOSCOPY WITH STENT REPLACEMENT;  Surgeon: Bjorn Pippin, MD;  Location: [facility];  Service: Urology;  Laterality: Left;   CYSTOSCOPY WITH FULGERATION  11/17/2021   Procedure: CYSTOSCOPY WITH FULGERATION;  Surgeon: Bjorn Pippin, MD;  Location: [facility];  Service: Urology;;   CYSTOSCOPY WITH STENT PLACEMENT Left 10/17/2021   Procedure: CYSTOSCOPY WITH STENT PLACEMENT;  Surgeon: Riki Altes, MD;  Location: [facility];  Service: Urology;  Laterality: Left;   CYSTOSCOPY WITH URETEROSCOPY, STONE BASKETRY AND STENT PLACEMENT Left 10/16/2021   Procedure: CYSTOSCOPY WITH URETEROSCOPY  AND STENT REMOVAL;  Surgeon: Riki Altes, MD;  Location: [facility];  Service: Urology;  Laterality: Left;   ENTEROSCOPY N/A 08/17/2021   Procedure: ENTEROSCOPY;  Surgeon: Toney Reil, MD;  Location: [facility] END[facility]COPY;  Service:  Gastroenterology;  Laterality: N/A;   ESOPHAGOGASTRODUODENOSCOPY N/A 06/21/2021   Procedure: ESOPHAGOGASTRODUODENOSCOPY (EGD);  Surgeon: Toledo, Boykin Nearing, MD;  Location: [facility];  Service: Gastroenterology;  Laterality: N/A;   ESOPHAGOGASTRODUODENOSCOPY (EGD) WITH PROPOFOL N/A 08/17/2021   Procedure: ESOPHAGOGASTRODUODENOSCOPY (EGD) WITH PROPOFOL;  Surgeon: Toney Reil, MD;  Location: [facility] ENDOSCOPY;  Service: Gastroenterology;  Laterality: N/A;   ESOPHAGOGASTRODUODENOSCOPY (EGD) WITH PROPOFOL N/A 10/14/2021   Procedure: ESOPHAGOGASTRODUODENOSCOPY (EGD) WITH PROPOFOL;  Surgeon: Midge Minium, MD;  Location: [facility];  Service: Endoscopy;  Laterality: N/A;   ESOPHAGOGASTRODUODENOSCOPY (EGD) WITH PROPOFOL N/A 02/01/2022   Procedure: ESOPHAGOGASTRODUODENOSCOPY (EGD) WITH PROPOFOL;  Surgeon: Toney Reil, MD;  Location: [facility] ENDOSCOPY;  Service: Gastroenterology;  Laterality: N/A;   ESOPHAGOGASTRODUODENOSCOPY (EGD) WITH PROPOFOL N/A 03/15/2022   Procedure: ESOPHAGOGASTRODUODENOSCOPY (EGD) WITH PROPOFOL;  Surgeon: Wyline Mood, MD;  Location: [facility] ENDOSCOPY;  Service: Gastroenterology;  Laterality: N/A;   ESOPHAGOGASTRODUODENOSCOPY (EGD) WITH PROPOFOL N/A 04/18/2022   Procedure: ESOPHAGOGASTRODUODENOSCOPY (EGD) WITH PROPOFOL;  Surgeon: Toney Reil, MD;  Location: [facility] ENDOSCOPY;  Service: Gastroenterology;  Laterality: N/A;   ESOPHAGOGASTRODUODENOSCOPY (EGD) WITH PROPOFOL N/A 08/09/2022   Procedure: ESOPHAGOGASTRODUODENOSCOPY (EGD) WITH PROPOFOL;  Surgeon: Wyline Mood, MD;  Location: [facility] ENDOSCOPY;  Service: Gastroenterology;  Laterality: N/A;   GIVENS CAPSULE STUDY N/A 06/22/2021   Procedure: GIVENS CAPSULE STUDY;  Surgeon: Toledo, Boykin Nearing, MD;  Location: [facility] END[facility];  Service: Gastroenterology;  Laterality: N/A;   IR NEPHROSTOMY EXCHANGE LEFT  02/25/2022   IR NEPHROSTOMY EXCHANGE LEFT  03/27/2022   IR NEPHROSTOMY EXCHANGE LEFT  05/22/2022   IR NEPHROSTOMY EXCHANGE LEFT   07/24/2022   IR NEPHROSTOMY EXCHANGE LEFT  09/24/2022   IR NEPHROSTOMY PLACEMENT LEFT  12/27/2021   IVC FILTER INSERTION N/A 08/18/2020   Procedure: IVC FILTER INSERTION;  Surgeon: Renford Dills, MD;  Location: [facility] IN[facility];  Service: Cardiovascular;  Laterality: N/A;   PERIPHERAL VASCULAR THROMBECTOMY Bilateral 01/03/2021   Procedure: PERIPHERAL VASCULAR THROMBECTOMY;  Surgeon: Annice Needy, MD;  Location: [facility];  Service: Cardiovascular;  Laterality: Bilateral;   PULMONARY THROMBECTOMY N/A 08/18/2020   Procedure: PULMONARY THROMBECTOMY;  Surgeon: Renford Dills, MD;  Location: [facility];  Service: Cardiovascular;  Laterality: N/A;   TUBAL LIGATION     VISCERAL ANGIOGRAPHY N/A 07/18/2021   Procedure: VISCERAL ANGIOGRAPHY;  Surgeon: Annice Needy, MD;  Location: [facility];  Service: Cardiovascular;  Laterality: N/A;    SOCIAL HISTORY:   Social History   Tobacco Use   Smoking status: Never   Smokeless tobacco: Never   Tobacco comments:    smoked 2-4 cigarettes for about 2-3 weeks   Substance Use Topics   Alcohol use: Not Currently    FAMILY HISTORY:   Family History  Problem Relation Age of Onset   Hypertension Mother    Diabetes Mother    Diabetes Father    Hypertension Father    High Cholesterol Father    Congestive Heart Failure Father    Breast cancer Cousin     DRUG ALLERGIES:  No Known Allergies  REVIEW OF SYSTEMS:   ROS As per history of present illness. All pertinent systems were reviewed above. Constitutional, HEENT, cardiovascular, respiratory, GI, GU, musculoskeletal, neuro, psychiatric, endocrine, integumentary and hematologic systems were reviewed and are otherwise negative/unremarkable except for positive findings mentioned above in the HPI.   MEDICATIONS AT HOME:   Prior to Admission medications   Medication Sig Start Date End Date Taking? Authorizing Provider  midodrine (PROAMATINE) 10 MG tablet Take 10  mg by mouth 3 (three) times daily. 11/05/22  Yes [provider]  acetaminophen (TYLENOL) 500 MG tablet Take 500 mg by mouth every 6 (six) hours as needed for mild pain or fever.    [provider]  albuterol (VENTOLIN HFA) 108 (90 Base) MCG/ACT inhaler Inhale 2 puffs into the lungs every 6 (six) hours as needed for wheezing or shortness of breath. 10/26/22   Marrion Coy, MD  Ascorbic Acid (VITAMIN C) 1000 MG tablet Take 1,000 mg by mouth daily.    [provider]  FEROSUL 325 (65 Fe) MG tablet Take 325 mg by mouth daily with breakfast. 08/14/22   [provider]  fluticasone furoate-vilanterol (BREO ELLIPTA) 100-25 MCG/ACT AEPB Inhale 1 puff into the lungs daily.    [provider]  furosemide (LASIX) 20 MG tablet Take 1 tablet (20 mg total) by mouth daily. Home med. 10/07/22   Delma Freeze, FNP  loperamide (IMODIUM) 2 MG capsule Take 2 mg by mouth once. 09/27/22   [provider]  Multiple Vitamin (MULTIVITAMIN WITH MINERALS) TABS tablet Take 1 tablet by mouth daily.  09/12/20   Standley Brooking, MD  pantoprazole (PROTONIX) 40 MG tablet Take 1 tablet (40 mg total) by mouth 2 (two) times daily. 03/01/22 08/01/23  Tresa Moore, MD  polyethylene glycol (MIRALAX / GLYCOLAX) 17 g packet Take 17 g by mouth daily as needed for mild constipation. 03/29/22   Rolly Salter, MD  rosuvastatin (CRESTOR) 10 MG tablet Take 1 tablet (10 mg total) by mouth daily. 10/03/20   Rickard Patience, MD      VITAL SIGNS:  Blood pressure 114/63, pulse 86, temperature 98.8 F (37.1 C), temperature source Oral, resp. rate 20, height 4\' 11"  (1.499 m), weight 81.6 kg, SpO2 99 %.  PHYSICAL EXAMINATION:  Physical Exam  GENERAL:  67 y.o.-year-old Caucasian female patient lying in the bed with no acute distress.  EYES: Pupils equal, round, reactive to light and accommodation. No scleral icterus. Extraocular muscles intact.  HEENT: Head atraumatic, normocephalic. Oropharynx and  nasopharynx clear.  NECK:  Supple, no jugular venous distention. No thyroid enlargement, no tenderness.  LUNGS: Normal breath sounds bilaterally, no wheezing, rales,rhonchi or crepitation. No use of accessory muscles of respiration.  CARDIOVASCULAR: Regular rate and rhythm, S1, S2 normal. No murmurs, rubs, or gallops.  ABDOMEN: Soft, nondistended, nontender. Bowel sounds present. No organomegaly or mass.  Left CVA tenderness.  Left nephrostomy in place. EXTREMITIES: No pedal edema, cyanosis, or clubbing.  NEUROLOGIC: Cranial nerves II through XII are intact. Muscle strength 5/5 in all extremities. Sensation intact. Gait not checked.  PSYCHIATRIC: The patient is alert and oriented x 3.  Normal affect and good eye contact. SKIN: No obvious rash, lesion, or ulcer.   LABORATORY PANEL:   CBC Recent Labs  Lab 11/20/22 1809  WBC 4.3  HGB 8.5*  HCT 29.5*  PLT 303   ------------------------------------------------------------------------------------------------------------------  Chemistries  Recent Labs  Lab 11/20/22 2141  NA 138  K 3.3*  CL 103  CO2 24  GLUCOSE 146*  BUN 19  CREATININE 0.94  CALCIUM 8.9  AST 25  ALT 12  ALKPHOS 51  BILITOT 1.0   ------------------------------------------------------------------------------------------------------------------  Cardiac Enzymes No results for input(s): "TROPONINI" in the last 168 hours. ------------------------------------------------------------------------------------------------------------------  RADIOLOGY:  CT ABDOMEN PELVIS W CONTRAST  Result Date: 11/21/2022 CLINICAL DATA:  Abdominal pain, acute, nonlocalized EXAM: CT ABDOMEN AND PELVIS WITH CONTRAST TECHNIQUE: Multidetector CT imaging of the abdomen and pelvis was performed using the standard protocol following bolus administration of intravenous contrast. RADIATION DOSE REDUCTION: This exam was performed according to the departmental dose-optimization program which  includes automated exposure control, adjustment of the mA and/or kV according to patient size and/or use of iterative reconstruction technique. CONTRAST:  75mL OMNIPAQUE IOHEXOL 350 MG/ML SOLN COMPARISON:  CT angio chest 11/20/2022, CT abdomen pelvis 03/26/2022 FINDINGS: Lower chest: Please see separately dictated CT angio chest 11/20/2022. Hepatobiliary: The liver is mildly enlarged measuring up to 18 cm. No focal liver abnormality. No gallstones, gallbladder wall thickening, or pericholecystic fluid. No biliary dilatation. Pancreas: Diffusely atrophic. No focal lesion. Otherwise normal pancreatic contour. No surrounding inflammatory changes. No main pancreatic ductal dilatation. Spleen: Normal in size without focal abnormality. Adrenals/Urinary Tract: No adrenal nodule bilaterally. Left percutaneous nephrostomy tube with pigtail terminating within the renal pelvis. Bilateral kidneys enhance symmetrically. No hydronephrosis. No hydroureter. 2 mm right nephrolithiasis. No left nephrolithiasis. Mild urothelial thickening of bilateral collecting system with slightly hazy urinary bladder wall contour and mild perivesicular fat stranding. The urinary bladder is unremarkable. On delayed imaging, there is no urothelial wall thickening and there  are no filling defects in the opacified portions of the bilateral collecting systems or ureters. Stomach/Bowel: Stomach is within normal limits. No evidence of bowel wall thickening or dilatation. Under distended colon. Appendix appears normal. Vascular/Lymphatic: Inferior vena cava filter in appropriate position. No abdominal aorta or iliac aneurysm. Moderate atherosclerotic plaque of the aorta and its branches. No abdominal, pelvic, or inguinal lymphadenopathy. Reproductive: Uterus and bilateral adnexa are unremarkable. Other: No intraperitoneal free fluid. No intraperitoneal free gas. No organized fluid collection. Musculoskeletal: No abdominal wall hernia or abnormality.  Diffuse subcutaneus soft tissue edema. No suspicious lytic or blastic osseous lesions. No acute displaced fracture. Severe degenerative changes of the bilateral hips. IMPRESSION: 1. Findings suggestive of bilateral collecting system and urinary bladder infection. Correlate with urinalysis. 2. Left nephrostomy tube in appropriate position. 3. Nonobstructive 2 mm right nephrolithiasis. 4. Mild hepatomegaly. 5. Severe bilateral degenerative changes of the hips. 6.  Aortic Atherosclerosis (ICD10-I70.0). Electronically Signed   By: Tish Frederickson M.D.   On: 11/21/2022 00:35   CT Angio Chest PE W and/or Wo Contrast  Result Date: 11/21/2022 CLINICAL DATA:  Pulmonary embolism (PE) suspected, high prob. Shortness of breath EXAM: CT ANGIOGRAPHY CHEST WITH CONTRAST TECHNIQUE: Multidetector CT imaging of the chest was performed using the standard protocol during bolus administration of intravenous contrast. Multiplanar CT image reconstructions and MIPs were obtained to evaluate the vascular anatomy. RADIATION DOSE REDUCTION: This exam was performed according to the departmental dose-optimization program which includes automated exposure control, adjustment of the mA and/or kV according to patient size and/or use of iterative reconstruction technique. CONTRAST:  75mL OMNIPAQUE IOHEXOL 350 MG/ML SOLN COMPARISON:  10/24/2022 FINDINGS: Cardiovascular: No filling defects in the pulmonary arteries to suggest pulmonary emboli. Heart is normal size. Aorta is normal caliber. Scattered aortic calcifications. Mediastinum/Nodes: No mediastinal, hilar, or axillary adenopathy. Trachea and esophagus are unremarkable. Thyroid unremarkable. Lungs/Pleura: No confluent airspace opacities or effusions. Upper Abdomen: No acute findings in the upper abdomen. Musculoskeletal: Chest wall soft tissues are unremarkable. No acute bony abnormality. Review of the MIP images confirms the above findings. IMPRESSION: No evidence of pulmonary embolus. No  acute cardiopulmonary disease. Aortic Atherosclerosis (ICD10-I70.0). Electronically Signed   By: Charlett Nose M.D.   On: 11/21/2022 00:07   DG Chest 2 View  Result Date: 11/20/2022 CLINICAL DATA:  Shortness of breath EXAM: CHEST - 2 VIEW COMPARISON:  Chest radiograph dated 10/24/2022 FINDINGS: Normal lung volumes. No focal consolidations. No pleural effusion or pneumothorax. The heart size and mediastinal contours are within normal limits. No acute osseous abnormality. Partially imaged IVC filter. IMPRESSION: No active cardiopulmonary disease. Electronically Signed   By: Agustin Cree M.D.   On: 11/20/2022 18:57      IMPRESSION AND PLAN:  Assessment and Plan: * Sepsis due to gram-negative UTI [facility]) - The patient will be admitted to a medical telemetry bed. - Sepsis manifested by tachycardia and tachypnea.  She meets severe sepsis criteria as her initial lactic acid was 3.8 - We will follow blood and urine cultures. - We will continue her for now on any therapy with IV cefepime and vancomycin. - We will continue hydration with IV lactated ringer.  Acute pyelonephritis - This is likely the culprit for #1. - Management as above. - She is due for her left nephrostomy tube removal. - Urology consult can be called as needed in AM. - IR consult can be obtained as well for replacement of her left nephrostomy.  Hypokalemia - Potassium will be replaced and magnesium  level will be checked.  Chest pain - Will follow serial troponins. - The patient will be placed on aspirin as well as p.r.n. sublingual nitroglycerin and morphine sulfate for pain. - We will obtain a cardiology consult in a.m. for further cardiac risk stratification. - I notified Dr. Nelly Laurence about the patient   Chronic diastolic CHF (congestive heart failure) (HCC) - We will continue Lasix for now.  Dyslipidemia - We will continue statin therapy.  GERD without esophagitis - We will continue PPI therapy.     DVT prophylaxis:  Lovenox. Advanced Care Planning:  Code Status: full code. Family Communication:  The plan of care was discussed in details with the patient (and family). I answered all questions. The patient agreed to proceed with the above mentioned plan. Further management will depend upon hospital course. Disposition Plan: Back to previous home environment Consults called: none. All the records are reviewed and case discussed with ED provider.  Status is: Inpatient   At the time of the admission, it appears that the appropriate admission status for this patient is inpatient.  This is judged to be reasonable and necessary in order to provide the required intensity of service to ensure the patient's safety given the presenting symptoms, physical exam findings and initial radiographic and laboratory data in the context of comorbid conditions.  The patient requires inpatient status due to high intensity of service, high risk of further deterioration and high frequency of surveillance required.  I certify that at the time of admission, it is my clinical judgment that the patient will require inpatient hospital care extending more than 2 midnights.                            Dispo: The patient is from: Home              Anticipated d/c is to: Home              Patient currently is not medically stable to d/c.              Difficult to place patient: No  Hannah Beat M.D on 11/21/2022 at 2:12 AM  [facility]   From 7 PM-7 AM, contact night-coverage [email or web address]  CC: Primary care physician; [facility]

## 2022-11-21 NOTE — TOC Initial Note (Signed)
Transition of Care [facility]) - Initial/Assessment Note    Patient Details  Name: Courtney Mack MRN: [id number] Date of Birth: March 24, 1956  Transition of Care [facility]) CM/SW Contact:    Margarito Liner, LCSW Phone Number: 11/21/2022, 12:20 PM  Clinical Narrative: Readmission prevention screen complete. CSW met with patient. Daughter and grandson at bedside. CSW introduced role and explained that discharge planning would be discussed. PCP is [facility] Let[facility]. Daughter drives her to appointments. Pharmacy is [facility] in [place]. No issues obtaining medications. Patient lives with her daughter and 5 grandchildren. No home health prior to admission although daughter stated she might need it at discharge. She has a RW, wheelchair, BSC, and shower chair. Patient asked about getting a wheelchair but it is only 2 years ago so she will have to wait another 3 years before insurance will pay for another one. Encouraged them to check the hospice thrift store. No further concerns. CSW encouraged patient to contact CSW as needed. CSW will continue to follow patient for support and facilitate return home once stable. Daughter will transport her home at discharge.                 Expected Discharge Plan: Home/Self Care Barriers to Discharge: Continued Medical Work up   Patient Goals and CMS Choice            Expected Discharge Plan and Services     Post Acute Care Choice: NA Living arrangements for the past 2 months: Apartment                                      Prior Living Arrangements/Services Living arrangements for the past 2 months: Apartment Lives with:: Adult Children, Relatives Patient language and need for interpreter reviewed:: Yes Do you feel safe going back to the place where you live?: Yes      Need for Family Participation in Patient Care: Yes (Comment) Care giver support system in place?: Yes (comment) Current home services: DME Criminal Activity/Legal  Involvement Pertinent to Current Situation/Hospitalization: No - Comment as needed  Activities of Daily Living Home Assistive Devices/Equipment: Dan Humphreys (specify type) ADL Screening (condition at time of admission) Patient's cognitive ability adequate to safely complete daily activities?: Yes Is the patient deaf or have difficulty hearing?: No Does the patient have difficulty seeing, even when wearing glasses/contacts?: No Does the patient have difficulty concentrating, remembering, or making decisions?: No Patient able to express need for assistance with ADLs?: Yes Does the patient have difficulty dressing or bathing?: No Independently performs ADLs?: Yes (appropriate for developmental age) Does the patient have difficulty walking or climbing stairs?: Yes Weakness of Legs: Both Weakness of Arms/Hands: None  Permission Sought/Granted                  Emotional Assessment Appearance:: Appears stated age Attitude/Demeanor/Rapport: Engaged, Gracious Affect (typically observed): Accepting, Appropriate, Calm, Pleasant Orientation: : Oriented to Place, Oriented to Self, Oriented to  Time, Oriented to Situation Alcohol / Substance Use: Not Applicable Psych Involvement: No (comment)  Admission diagnosis:  Pyelonephritis [N12] Elevated lactic acid level [R79.89] Dyspnea, unspecified type [R06.00] Sepsis due to gram-negative UTI (HCC) [A41.50, N39.0] Patient Active Problem List   Diagnosis Date Noted   Sepsis due to gram-negative UTI (HCC) 11/21/2022   Acute pyelonephritis 11/21/2022   Dyspnea 11/21/2022   Chest pain 11/21/2022   Hypokalemia 10/25/2022   COPD  exacerbation (HCC) 10/24/2022   Iron deficiency anemia 10/24/2022   Symptomatic anemia 08/08/2022   COPD (chronic obstructive pulmonary disease) (HCC) 08/07/2022   Sepsis due to pneumonia (HCC) 06/12/2022   Multifocal pneumonia 06/12/2022   GERD without esophagitis 06/12/2022   Generalized weakness 06/12/2022    Nephrostomy status (HCC) 06/12/2022   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) 04/16/2022   GI bleed 03/14/2022   Acute on chronic anemia 02/24/2022   Hypotension 02/24/2022   Acute cystitis with hematuria    Lymphedema 02/08/2022   Hematuria 12/24/2021   History of pulmonary embolus (PE) 12/24/2021   Chronic radiation cystitis 11/17/2021   Bilateral hydronephrosis s/p left nephrostomy tube June 2023    Gastric antral vascular ectasia    UTI (urinary tract infection) 09/13/2021   Melena 07/18/2021   Acute on chronic blood loss anemia 07/18/2021   HLD (hyperlipidemia) 06/20/2021   Chronic diastolic CHF (congestive heart failure) (HCC) 06/20/2021   Risk factors for obstructive sleep apnea 11/10/2020   IDA (iron deficiency anemia) 09/21/2020   Endometrial cancer (HCC) 09/13/2020   Recurrent deep vein thrombosis (DVT) (HCC) 09/06/2020   Morbidly obese (HCC) 08/23/2020   Dyslipidemia 08/23/2020   PCP:  [facility]:   [id number] [place], [place] - [address] AT [facility] [address] [place] [address] Phone: [phone] Fax: [phone]     Social Determinants of Health (SDOH) Social History: SDOH Screenings   Food Insecurity: No Food Insecurity (11/21/2022)  Housing: Low Risk  (11/21/2022)  Transportation Needs: No Transportation Needs (11/21/2022)  Utilities: Not At Risk (11/21/2022)  Depression (PHQ2-9): Low Risk  (04/11/2022)  Tobacco Use: Low Risk  (11/20/2022)   SDOH Interventions:     Readmission Risk Interventions    11/21/2022   12:18 PM 06/12/2022   11:45 AM 04/18/2022    2:50 PM  Readmission Risk Prevention Plan  Transportation Screening Complete Complete Complete  Medication Review Oceanographer) Complete Complete Complete  PCP or Specialist appointment within 3-5 days of discharge Complete Complete Complete  HRI or Home Care Consult  Complete   SW  Recovery Care/Counseling Consult Complete Complete Complete  Palliative Care Screening Not Applicable Not Applicable Not Applicable  Skilled Nursing Facility Not Applicable Not Applicable Not Applicable

## 2022-11-21 NOTE — Progress Notes (Signed)
Patient received from IR awake and alert. Dressing CDI. Nephrostomy tube draining without complications.  Courtney Mack

## 2022-11-21 NOTE — Progress Notes (Signed)
Pharmacy Antibiotic Note  Courtney Mack is a 67 y.o. female admitted on 11/20/2022 with hx of Enterobacter faecalis.  Pharmacy has been consulted for Vancomycin dosing.  Plan: Pt given Vancomycin 2000 mg once. Vancomycin 750 mg IV Q 24 hrs. Goal AUC 400-550. Expected AUC: 451 Expected Cmin: 11.5 SCr used: 0.94, Vd used: 0.5, BMI: 36.3  Pharmacy will continue to follow and will adjust abx dosing whenever warranted.  Temp (24hrs), Avg:98.3 F (36.8 C), Min:97.5 F (36.4 C), Max:98.8 F (37.1 C)   Recent Labs  Lab 11/20/22 1809 11/20/22 2141 11/21/22 0039 11/21/22 0304  WBC 4.3  --   --  3.9*  CREATININE  --  0.94  --  0.85  LATICACIDVEN  --  3.8* 2.0*  --      Estimated Creatinine Clearance: 59.4 mL/min (by C-G formula based on SCr of 0.85 mg/dL).    No Known Allergies  Antimicrobials this admission: 5/15 Cefepime >> 5/15 Vancomycin >>   Microbiology results: 5/15 BCx: collected  Thank you for allowing pharmacy to be a part of this patient's care.  Bettey Costa, PharmD Clinical Pharmacist 11/21/2022 3:29 PM

## 2022-11-21 NOTE — Assessment & Plan Note (Addendum)
Presented with tachycardia and tachypnea and elevated lactic acid.  Urine culture not obtained blood cultures no growth to date.  Has remained afebrile on cefepime and vancomycin. - Narrowed to Rocephin - Follow-up blood cultures

## 2022-11-21 NOTE — Progress Notes (Signed)
*  PRELIMINARY RESULTS* Echocardiogram 2D Echocardiogram has been performed.  Carolyne Fiscal 11/21/2022, 9:54 AM

## 2022-11-21 NOTE — Assessment & Plan Note (Addendum)
This resolved.  She was evaluated by cardiology who recommended no further ischemic workup.

## 2022-11-21 NOTE — Progress Notes (Signed)
Pharmacy Antibiotic Note  Courtney Mack is a 67 y.o. female admitted on 11/20/2022 with hx of Enterobacter faecalis.  Pharmacy has been consulted for Vancomycin dosing.  Plan: Pt given Vancomycin 2000 mg once. Vancomycin 1000 mg IV Q 24 hrs. Goal AUC 400-550. Expected AUC: 456.6 SCr used: 0.94, Vd used: 0.72, BMI: 36.3  Pharmacy will continue to follow and will adjust abx dosing whenever warranted.  Temp (24hrs), Avg:98.7 F (37.1 C), Min:98.7 F (37.1 C), Max:98.7 F (37.1 C)   Recent Labs  Lab 11/20/22 1809 11/20/22 2141 11/21/22 0039  WBC 4.3  --   --   CREATININE  --  0.94  --   LATICACIDVEN  --  3.8* 2.0*    Estimated Creatinine Clearance: 53.7 mL/min (by C-G formula based on SCr of 0.94 mg/dL).    No Known Allergies  Antimicrobials this admission: 5/15 Cefepime >> 5/15 Vancomycin >>   Microbiology results: 5/15 BCx: Pending  Thank you for allowing pharmacy to be a part of this patient's care.  Otelia Sergeant, PharmD, [facility] 11/21/2022 1:36 AM

## 2022-11-22 ENCOUNTER — Telehealth: Payer: Self-pay

## 2022-11-22 DIAGNOSIS — D649 Anemia, unspecified: Secondary | ICD-10-CM

## 2022-11-22 DIAGNOSIS — N1330 Unspecified hydronephrosis: Secondary | ICD-10-CM

## 2022-11-22 DIAGNOSIS — N390 Urinary tract infection, site not specified: Secondary | ICD-10-CM | POA: Diagnosis not present

## 2022-11-22 DIAGNOSIS — A4150 Gram-negative sepsis, unspecified: Secondary | ICD-10-CM | POA: Diagnosis not present

## 2022-11-22 DIAGNOSIS — R079 Chest pain, unspecified: Secondary | ICD-10-CM | POA: Diagnosis not present

## 2022-11-22 DIAGNOSIS — E669 Obesity, unspecified: Secondary | ICD-10-CM | POA: Insufficient documentation

## 2022-11-22 DIAGNOSIS — I5032 Chronic diastolic (congestive) heart failure: Secondary | ICD-10-CM | POA: Diagnosis not present

## 2022-11-22 LAB — CBC
HCT: 26.4 % — ABNORMAL LOW (ref 36.0–46.0)
Hemoglobin: 8 g/dL — ABNORMAL LOW (ref 12.0–15.0)
MCH: 25.2 pg — ABNORMAL LOW (ref 26.0–34.0)
MCHC: 30.3 g/dL (ref 30.0–36.0)
MCV: 83.3 fL (ref 80.0–100.0)
Platelets: 231 10*3/uL (ref 150–400)
RBC: 3.17 MIL/uL — ABNORMAL LOW (ref 3.87–5.11)
RDW: 19 % — ABNORMAL HIGH (ref 11.5–15.5)
WBC: 3.4 10*3/uL — ABNORMAL LOW (ref 4.0–10.5)
nRBC: 0 % (ref 0.0–0.2)

## 2022-11-22 LAB — IRON AND TIBC
Iron: 23 ug/dL — ABNORMAL LOW (ref 28–170)
Saturation Ratios: 8 % — ABNORMAL LOW (ref 10.4–31.8)
TIBC: 279 ug/dL (ref 250–450)
UIBC: 256 ug/dL

## 2022-11-22 LAB — BPAM RBC: Blood Product Expiration Date: 202406102359

## 2022-11-22 LAB — BASIC METABOLIC PANEL
Anion gap: 7 (ref 5–15)
BUN: 18 mg/dL (ref 8–23)
CO2: 25 mmol/L (ref 22–32)
Calcium: 8.6 mg/dL — ABNORMAL LOW (ref 8.9–10.3)
Chloride: 108 mmol/L (ref 98–111)
Creatinine, Ser: 1.04 mg/dL — ABNORMAL HIGH (ref 0.44–1.00)
GFR, Estimated: 59 mL/min — ABNORMAL LOW (ref >60)
Glucose, Bld: 137 mg/dL — ABNORMAL HIGH (ref 70–99)
Potassium: 3.1 mmol/L — ABNORMAL LOW (ref 3.5–5.1)
Sodium: 140 mmol/L (ref 135–145)

## 2022-11-22 LAB — TYPE AND SCREEN: Unit division: 0

## 2022-11-22 LAB — FERRITIN: Ferritin: 19 ng/mL (ref 11–307)

## 2022-11-22 LAB — HIV ANTIBODY (ROUTINE TESTING W REFLEX): HIV Screen 4th Generation wRfx: NONREACTIVE

## 2022-11-22 MED ORDER — POTASSIUM CHLORIDE CRYS ER 20 MEQ PO TBCR
40.0000 meq | EXTENDED_RELEASE_TABLET | Freq: Two times a day (BID) | ORAL | Status: AC
  Administered 2022-11-22 (×2): 40 meq via ORAL
  Filled 2022-11-22 (×2): qty 2

## 2022-11-22 MED ORDER — SODIUM CHLORIDE 0.9 % IV SOLN
125.0000 mg | Freq: Every day | INTRAVENOUS | Status: AC
  Administered 2022-11-22 – 2022-11-23 (×2): 125 mg via INTRAVENOUS
  Filled 2022-11-22 (×2): qty 10

## 2022-11-22 MED ORDER — SODIUM CHLORIDE 0.9 % IV SOLN
2.0000 g | INTRAVENOUS | Status: DC
  Administered 2022-11-23: 2 g via INTRAVENOUS
  Filled 2022-11-22: qty 20

## 2022-11-22 NOTE — Progress Notes (Signed)
  Progress Note   Patient: Courtney Mack ZOX:[id number] DOB: 01-19-1956 DOA: 11/20/2022     1 DOS: the patient was seen and examined on 11/22/2022 at 9:19 AM      Brief hospital course: Mrs. Arntz is a 67 y.o. F with dCHF, obesity, VTE with IVC no longer on AC due to recurrent blood loss anemia, DM, HTN, lymphedema, endometrial CA in remission, and ureteral stricture with PCN who presented with acute shortness of breath.  In the ER, found to have Hgb down to 6, lactic acid 3.8, dimer 1.2.  CTA chest ruled out PE, edema.  Admitted on fluids, antibiotics for sepsis.  Cardiology consulted for chest pain.      Assessment and Plan: * Sepsis due to gram-negative UTI (HCC) Presented with tachycardia and tachypnea and elevated lactic acid.  Urine culture not obtained blood cultures no growth to date.  Has remained afebrile on cefepime and vancomycin. - Narrowed to Rocephin - Follow-up blood cultures     Acute on chronic anemia Hemoglobin down to 6.6, transfuse 1 unit and hemoglobin up to the mid eights.  Her baseline appears to be around 9.  Iron studies very low. - Start IV iron  Acute pyelonephritis Percutaneous nephrostomy tube was exchanged yesterday  Hypokalemia Repleted and resolved  Chest pain This resolved.  She was evaluated by cardiology who recommended no further ischemic workup.  Chronic diastolic CHF (congestive heart failure) (HCC) Dyspnea improved with IV Lasix - Continue oral Lasix  Dyslipidemia - Continue Crestor  GERD without esophagitis - Continue pantoprazole          Subjective: Patient had more dyspnea overnight, resolved spontaneously.  She has had no fever, sputum, confusion, vomiting, back pain.  Nursing of no concerns.     Physical Exam: BP (!) 106/54 (BP Location: Left Arm)   Pulse 84   Temp 98.3 F (36.8 C) (Oral)   Resp 18   Ht 4\' 11"  (1.499 m)   Wt 81.6 kg   SpO2 97%   BMI 36.36 kg/m   Elderly adult female, lying in  bed, interactive and appropriate RRR, no murmurs, no peripheral edema Amblyopia noted Respiratory rate normal, lungs clear without rales or wheezes Abdomen soft without tenderness palpation or guarding Attention normal, affect appropriate, judgment and insight appear at baseline, face symmetric, speech fluent    Data Reviewed: Basic metabolic panel shows creatinine slightly up to 1.04, potassium 3.1 Iron studies show iron deficiency CBC shows stable anemia, normal platelets and white count  Family Communication: Sister at the bedside, called to daughter, no answer    Disposition: Status is: Inpatient The patient presented with chest pressure and dyspnea in the setting of anemia.  She is been transfused and her symptoms are somewhat better, in addition she will require some IV iron, and when we completed tomorrow, will plan to discharge home with cardiology follow-up        Author: Alberteen Sam, MD 11/22/2022 2:31 PM  For on call review [email or web address].

## 2022-11-22 NOTE — Evaluation (Addendum)
Physical Therapy Evaluation Patient Details Name: Courtney Mack MRN: [id number] DOB: December 30, 1955 Today's Date: 11/22/2022  History of Present Illness  Courtney Mack is a 67 y.o. F with dCHF, obesity, VTE with IVC no longer on AC due to recurrent blood loss anemia, DM, HTN, lymphedema, endometrial CA in remission, and ureteral stricture with PCN who presented with acute shortness of breath.   Clinical Impression  Pt alert, oriented, agreeable to PT. Reported at baseline she lives with family and uses a rollator for mobility, sleeps in a recliner, and has assist for ADLs as needed.   Bed mobility minA to assist pt with scooting and trunk elevation. Sit <> stand from EOB with minA (stabilizing RW, used to a rollator that locks). Difficulty noted with L knee flexion. She ambulated ~80ft with CGA, no LOB but decreased velocity and step length/height. Up in chair at end of session spO2 98% on room air, HR in 110s.  Overall the patient demonstrated deficits (see "PT Problem List") that impede the patient's functional abilities, safety, and mobility and would benefit from skilled PT intervention. Recommendation is to continue skilled PT services to maximize safety and endurance.      Recommendations for follow up therapy are one component of a multi-disciplinary discharge planning process, led by the attending physician.  Recommendations may be updated based on patient status, additional functional criteria and insurance authorization.  Follow Up Recommendations       Assistance Recommended at Discharge Intermittent Supervision/Assistance  Patient can return home with the following  Assistance with cooking/housework;Assist for transportation;Help with stairs or ramp for entrance;A little help with bathing/dressing/bathroom;A little help with walking and/or transfers    Equipment Recommendations Hospital bed  Recommendations for Other Services       Functional Status Assessment Patient has had  a recent decline in their functional status and demonstrates the ability to make significant improvements in function in a reasonable and predictable amount of time.     Precautions / Restrictions Precautions Precautions: Fall Restrictions Weight Bearing Restrictions: No      Mobility  Bed Mobility Overal bed mobility: Needs Assistance Bed Mobility: Supine to Sit     Supine to sit: Min assist     General bed mobility comments: handheld assist requested by patient, stated she normally sleeps in a recliner    Transfers Overall transfer level: Needs assistance Equipment used: Rolling walker (2 wheels) Transfers: Sit to/from Stand Sit to Stand: Min assist           General transfer comment: minA for steadying of RW, effortful for pt (used to a locked rollator)    Ambulation/Gait Ambulation/Gait assistance: Min guard Gait Distance (Feet): 20 Feet Assistive device: Rolling walker (2 wheels)         General Gait Details: decreased velocity and decreased step height/length  Stairs            Wheelchair Mobility    Modified Rankin (Stroke Patients Only)       Balance Overall balance assessment: Needs assistance Sitting-balance support: Feet supported Sitting balance-Leahy Scale: Fair     Standing balance support: Reliant on assistive device for balance Standing balance-Leahy Scale: Fair                               Pertinent Vitals/Pain Pain Assessment Pain Assessment: No/denies pain    Home Living Family/patient expects to be discharged to:: Private residence Living Arrangements: Children;Other relatives Available Help  at Discharge: Family Type of Home: House Home Access: Stairs to enter Entrance Stairs-Rails: None Secretary/administrator of Steps: 1   Home Layout: One level Home Equipment: Rollator (4 wheels);BSC/3in1;Rolling Walker (2 wheels) Additional Comments: sleeps in lift chair, sponge bathes at baseline    Prior  Function Prior Level of Function : Needs assist       Physical Assist : ADLs (physical)   ADLs (physical): IADLs;Dressing Mobility Comments: amb in house with rollator, has manual w/c for community distances ADLs Comments: assist for LB dressing, bathing; MOD I in UB dressing, grooming, toileting per pt report, wears briefs 2/2 urinary incontinence     Hand Dominance   Dominant Hand: Right    Extremity/Trunk Assessment   Upper Extremity Assessment Upper Extremity Assessment: Generalized weakness    Lower Extremity Assessment Lower Extremity Assessment: Generalized weakness    Cervical / Trunk Assessment Cervical / Trunk Assessment: Normal  Communication   Communication: No difficulties  Cognition Arousal/Alertness: Awake/alert Behavior During Therapy: WFL for tasks assessed/performed Overall Cognitive Status: Within Functional Limits for tasks assessed                                          General Comments      Exercises     Assessment/Plan    PT Assessment Patient needs continued PT services  PT Problem List Decreased strength;Decreased mobility;Decreased activity tolerance;Decreased balance;Decreased knowledge of use of DME       PT Treatment Interventions DME instruction;Therapeutic activities;Gait training;Therapeutic exercise;Patient/family education;Stair training;Balance training;Functional mobility training;Neuromuscular re-education    PT Goals (Current goals can be found in the Care Plan section)  Acute Rehab PT Goals Patient Stated Goal: to go home PT Goal Formulation: With patient Time For Goal Achievement: 12/06/22 Potential to Achieve Goals: Good    Frequency Min 2X/week     Co-evaluation               AM-PAC PT "6 Clicks" Mobility  Outcome Measure Help needed turning from your back to your side while in a flat bed without using bedrails?: A Little Help needed moving from lying on your back to sitting on the  side of a flat bed without using bedrails?: A Little Help needed moving to and from a bed to a chair (including a wheelchair)?: A Little Help needed standing up from a chair using your arms (e.g., wheelchair or bedside chair)?: A Little Help needed to walk in hospital room?: A Little Help needed climbing 3-5 steps with a railing? : A Lot 6 Click Score: 17    End of Session   Activity Tolerance: Patient tolerated treatment well Patient left: with call bell/phone within reach;with chair alarm set;in chair Nurse Communication: Mobility status PT Visit Diagnosis: Other abnormalities of gait and mobility (R26.89);Muscle weakness (generalized) (M62.81)    Time: 1610-9604 PT Time Calculation (min) (ACUTE ONLY): 17 min   Charges:   PT Evaluation $PT Eval Low Complexity: 1 Low PT Treatments $Therapeutic Activity: 8-22 mins        Olga Coaster PT, DPT 11:25 AM,11/22/22

## 2022-11-22 NOTE — Telephone Encounter (Signed)
Patient called in states that she is currently inpatient for another kidney infection. She wanted to know if you had any thoughts about what she can do to prevent these. Would you like for me to place her a hospital follow up appt to discuss?

## 2022-11-22 NOTE — Assessment & Plan Note (Signed)
Hemoglobin down to 6.6, transfuse 1 unit and hemoglobin up to the mid eights.  Her baseline appears to be around 9.  Iron studies very low. - Start IV iron

## 2022-11-22 NOTE — TOC Progression Note (Addendum)
Transition of Care [facility]) - Progression Note    Patient Details  Name: Courtney Mack MRN: [id number] Date of Birth: Aug 12, 1955  Transition of Care [facility]) CM/SW Contact  Margarito Liner, LCSW Phone Number: 11/22/2022, 11:18 AM  Clinical Narrative:  Patient and daughter are agreeable to home health. Preference is [facility]. They can accept for PT and RN.   11:32 am: PT asked about getting patient a hospital bed. Patient and daughter declined due to space in the home. They will contact her PCP if they change their minds.  2:45 pm: Daughter is aware of plan to discharge tomorrow if stable.  Expected Discharge Plan: Home/Self Care Barriers to Discharge: Continued Medical Work up  Expected Discharge Plan and Services     Post Acute Care Choice: NA Living arrangements for the past 2 months: Apartment                                       Social Determinants of Health (SDOH) Interventions SDOH Screenings   Food Insecurity: No Food Insecurity (11/21/2022)  Housing: Low Risk  (11/21/2022)  Transportation Needs: No Transportation Needs (11/21/2022)  Utilities: Not At Risk (11/21/2022)  Depression (PHQ2-9): Low Risk  (04/11/2022)  Tobacco Use: Low Risk  (11/21/2022)    Readmission Risk Interventions    11/21/2022   12:18 PM 06/12/2022   11:45 AM 04/18/2022    2:50 PM  Readmission Risk Prevention Plan  Transportation Screening Complete Complete Complete  Medication Review Oceanographer) Complete Complete Complete  PCP or Specialist appointment within 3-5 days of discharge Complete Complete Complete  HRI or Home Care Consult  Complete   SW Recovery Care/Counseling Consult Complete Complete Complete  Palliative Care Screening Not Applicable Not Applicable Not Applicable  Skilled Nursing Facility Not Applicable Not Applicable Not Applicable

## 2022-11-22 NOTE — Progress Notes (Signed)
Progress Note  Patient Name: Courtney Mack Date of Encounter: 11/22/2022  Primary Cardiologist: Mariah Milling  Subjective   Dyspnea improved with improvement in Hgb. No chest pain.  Remains net positive for admission.  Inpatient Medications    Scheduled Meds:  vitamin C  1,000 mg Oral Daily   enoxaparin (LOVENOX) injection  40 mg Subcutaneous Q24H   ferrous sulfate  325 mg Oral Q breakfast   fluticasone furoate-vilanterol  1 puff Inhalation Daily   furosemide  20 mg Oral Daily   multivitamin with minerals  1 tablet Oral Daily   pantoprazole  40 mg Oral BID   potassium chloride  40 mEq Oral BID   rosuvastatin  10 mg Oral Daily   Continuous Infusions:  ceFEPime (MAXIPIME) IV Stopped (11/22/22 1058)   ferric gluconate (FERRLECIT) IVPB 125 mg (11/22/22 1116)   vancomycin Stopped (11/21/22 2316)   PRN Meds: acetaminophen **OR** acetaminophen, albuterol, loperamide, magnesium hydroxide, morphine injection, ondansetron **OR** ondansetron (ZOFRAN) IV, polyethylene glycol, traZODone   Vital Signs    Vitals:   11/21/22 1610 11/21/22 1810 11/21/22 1952 11/22/22 0404  BP: (!) 101/53 (!) 99/42 (!) 96/47 (!) 106/54  Pulse: 83 91 86 84  Resp:   18 18  Temp: 98.4 F (36.9 C)  98.3 F (36.8 C) 98.3 F (36.8 C)  TempSrc: Oral  Oral Oral  SpO2: 100% 97% 97% 97%  Weight:      Height:        Intake/Output Summary (Last 24 hours) at 11/22/2022 1234 Last data filed at 11/22/2022 1143 Gross per 24 hour  Intake 1904 ml  Output 2825 ml  Net -921 ml   Filed Weights   11/20/22 1805  Weight: 81.6 kg    Telemetry    SR, 70s bpm - Personally Reviewed  ECG    No new tracings - Personally Reviewed  Physical Exam   GEN: No acute distress.   Neck: No JVD. Cardiac: RRR, no murmurs, rubs, or gallops.  Respiratory: Clear to auscultation bilaterally.  GI: Soft, nontender, non-distended.   MS: No edema; No deformity. Neuro:  Alert and oriented x 3; Nonfocal.  Psych: Normal  affect.  Labs    Chemistry Recent Labs  Lab 11/20/22 2141 11/21/22 0304 11/22/22 0606  NA 138 137 140  K 3.3* 3.4* 3.1*  CL 103 106 108  CO2 24 22 25   GLUCOSE 146* 112* 137*  BUN 19 18 18   CREATININE 0.94 0.85 1.04*  CALCIUM 8.9 8.3* 8.6*  PROT 7.1  --   --   ALBUMIN 3.4*  --   --   AST 25  --   --   ALT 12  --   --   ALKPHOS 51  --   --   BILITOT 1.0  --   --   GFRNONAA >60 >60 59*  ANIONGAP 11 9 7      Hematology Recent Labs  Lab 11/20/22 1809 11/21/22 0304 11/21/22 0526 11/21/22 2000 11/22/22 0606  WBC 4.3 3.9*  --   --  3.4*  RBC 3.37* 2.70*  --   --  3.17*  HGB 8.5* 6.8* 6.6* 8.2* 8.0*  HCT 29.5* 23.3* 22.5* 27.4* 26.4*  MCV 87.5 86.3  --   --  83.3  MCH 25.2* 25.2*  --   --  25.2*  MCHC 28.8* 29.2*  --   --  30.3  RDW 19.1* 19.1*  --   --  19.0*  PLT 303 267  --   --  231    Cardiac EnzymesNo results for input(s): "TROPONINI" in the last 168 hours. No results for input(s): "TROPIPOC" in the last 168 hours.   BNP Recent Labs  Lab 11/20/22 2141  BNP 41.4     DDimer  Recent Labs  Lab 11/20/22 2141  DDIMER 1.28*     Radiology    IR NEPHROSTOMY EXCHANGE LEFT  Result Date: 11/21/2022 IMPRESSION: Successful exchange of left percutaneous nephrostomy tube for a new, similar 10 French nephrostomy tube. Patient to return to Interventional Radiology in 8 weeks for routine exchange. Electronically Signed   By: Olive Bass M.D.   On: 11/21/2022 15:40   CT ABDOMEN PELVIS W CONTRAST  Result Date: 11/21/2022 IMPRESSION: 1. Findings suggestive of bilateral collecting system and urinary bladder infection. Correlate with urinalysis. 2. Left nephrostomy tube in appropriate position. 3. Nonobstructive 2 mm right nephrolithiasis. 4. Mild hepatomegaly. 5. Severe bilateral degenerative changes of the hips. 6.  Aortic Atherosclerosis (ICD10-I70.0). Electronically Signed   By: Tish Frederickson M.D.   On: 11/21/2022 00:35   CT Angio Chest PE W and/or Wo  Contrast  Result Date: 11/21/2022 IMPRESSION: No evidence of pulmonary embolus. No acute cardiopulmonary disease. Aortic Atherosclerosis (ICD10-I70.0). Electronically Signed   By: Charlett Nose M.D.   On: 11/21/2022 00:07   DG Chest 2 View  Result Date: 11/20/2022 IMPRESSION: No active cardiopulmonary disease. Electronically Signed   By: Agustin Cree M.D.   On: 11/20/2022 18:57    Cardiac Studies   2D echo 11/21/2022: 1. Left ventricular ejection fraction, by estimation, is 60 to 65%. The  left ventricle has normal function. The left ventricle has no regional  wall motion abnormalities. Left ventricular diastolic parameters are  consistent with Grade I diastolic  dysfunction (impaired relaxation).   2. Right ventricular systolic function is normal. The right ventricular  size is normal. There is normal pulmonary artery systolic pressure. The  estimated right ventricular systolic pressure is 17.6 mmHg.   3. The mitral valve is normal in structure. No evidence of mitral valve  regurgitation. No evidence of mitral stenosis.   4. The aortic valve has an indeterminant number of cusps. Aortic valve  regurgitation is not visualized. No aortic stenosis is present.   5. The inferior vena cava is normal in size with greater than 50%  respiratory variability, suggesting right atrial pressure of 3 mmHg.   Patient Profile     67 y.o. female with history of morbid obesity, smoker, DVT and PE February 2022, uterine cancer with radiation and brachytherapy, suction thrombectomy of bilateral pulmonary arteries with IVC filter placement, recurrent bilateral lower extremity DVT, PE June 2022 with thrombectomy, GI bleed December 2022 hemoglobin 3.6, nonbleeding ulcer noted, readmission for melena January 2023 on Xarelto, hemoglobin 4.8, hospitalized February 2023 hemoglobin 4.7, March 2023 urinary tract infection/sepsis left ureteral stent placed Proteus Enterobacter blood cultures, diuresis 21 L who was admitted  with sepsis secondary to gram-negative UTI, acute pyelonephritis, and symptomatic anemia who we are asked to see for shortness of breath and chest pain.  Assessment & Plan    1.  Dyspnea/chest pressure: -Felt to be exacerbated by symptomatic anemia with hemoglobin of 6.8, possibly delusional component -Improved following improvement in hemoglobin with IV iron and transfusion of PRBC -High-sensitivity troponin negative x 4 this admission -Echo demonstrated preserved LV systolic function with normal wall motion -Consider outpatient noninvasive ischemic evaluation -Gentle diuresis as indicated given patient remains net fluid positive       For questions or  updates, please contact [facility] Please consult [email or web address] for contact info under Cardiology/STEMI.    Signed, Eula Listen, PA-C [facility] Pager: [phone] 11/22/2022, 12:34 PM

## 2022-11-23 DIAGNOSIS — A4150 Gram-negative sepsis, unspecified: Secondary | ICD-10-CM | POA: Diagnosis not present

## 2022-11-23 DIAGNOSIS — N390 Urinary tract infection, site not specified: Secondary | ICD-10-CM | POA: Diagnosis not present

## 2022-11-23 LAB — BASIC METABOLIC PANEL
Anion gap: 5 (ref 5–15)
BUN: 16 mg/dL (ref 8–23)
CO2: 25 mmol/L (ref 22–32)
Calcium: 8.7 mg/dL — ABNORMAL LOW (ref 8.9–10.3)
Chloride: 109 mmol/L (ref 98–111)
Creatinine, Ser: 0.9 mg/dL (ref 0.44–1.00)
GFR, Estimated: >60 mL/min (ref >60)
Glucose, Bld: 102 mg/dL — ABNORMAL HIGH (ref 70–99)
Potassium: 3.8 mmol/L (ref 3.5–5.1)
Sodium: 139 mmol/L (ref 135–145)

## 2022-11-23 LAB — CULTURE, BLOOD (SINGLE): Culture: NO GROWTH

## 2022-11-23 LAB — CBC
HCT: 27.8 % — ABNORMAL LOW (ref 36.0–46.0)
Hemoglobin: 8.4 g/dL — ABNORMAL LOW (ref 12.0–15.0)
MCH: 25.2 pg — ABNORMAL LOW (ref 26.0–34.0)
MCHC: 30.2 g/dL (ref 30.0–36.0)
MCV: 83.5 fL (ref 80.0–100.0)
Platelets: 267 10*3/uL (ref 150–400)
RBC: 3.33 MIL/uL — ABNORMAL LOW (ref 3.87–5.11)
RDW: 19.1 % — ABNORMAL HIGH (ref 11.5–15.5)
WBC: 2.8 10*3/uL — ABNORMAL LOW (ref 4.0–10.5)
nRBC: 0 % (ref 0.0–0.2)

## 2022-11-23 NOTE — Discharge Summary (Signed)
Physician Discharge Summary  CHONA MOOTHART ZOX:[id number] DOB: 1956/04/29 DOA: 11/20/2022  PCP: [facility]  Admit date: 11/20/2022 Discharge date: 11/23/2022  Admitted From: Home Disposition:  Home with home health  Recommendations for Outpatient Follow-up:  Follow up with PCP in 1-2 weeks   Home Health:Yes PT RN Equipment/Devices:None   Discharge Condition:Stable  CODE STATUS:FULL  Diet recommendation: Reg  Brief/Interim Summary: Mrs. Lones is a 67 y.o. F with dCHF, obesity, VTE with IVC no longer on AC due to recurrent blood loss anemia, DM, HTN, lymphedema, endometrial CA in remission, and ureteral stricture with PCN who presented with acute shortness of breath.   In the ER, found to have Hgb down to 6, lactic acid 3.8, dimer 1.2.  CTA chest ruled out PE, edema.   Admitted on fluids, antibiotics for sepsis.  Cardiology consulted for chest pain.  No changes made in cardiac medications.  No indication for inpatient ischemic evaluation.  Patient can resume home cardiac medications at time of discharge.  Received IV iron for significant acute on chronic iron deficiency anemia.  Transfused 1 unit and received 2 doses of IV iron.  Hemoglobin 8.4 at time of discharge.  Baseline appears to be around 9.  Gram-negative UTI treated with 3 days of intravenous antibiotic.  Urine culture not obtained.  Blood cultures no growth to date.  Stable and afebrile at time of discharge.  No antibiotics indicated at time of DC.    Discharge Diagnoses:  Principal Problem:   Sepsis due to gram-negative UTI [facility]) Active Problems:   Acute on chronic anemia   Hydronephrosis   Hypokalemia   Acute pyelonephritis   Chest pain   Dyslipidemia   Chronic diastolic CHF (congestive heart failure) (HCC)   GERD without esophagitis   Obesity (BMI 30-39.9)  * Sepsis due to gram-negative UTI (HCC) Presented with tachycardia and tachypnea and elevated lactic acid.  Urine culture  not obtained blood cultures no growth to date.  Has remained afebrile on cefepime and vancomycin.  Antibiotics was de-escalated to Rocephin.  Received 3 days total of IV antibiotics.  Blood cultures remain no growth.  No indication to resume antibiotics at this point.   Acute on chronic anemia Hemoglobin down to 6.6, transfuse 1 unit and hemoglobin up to the mid eights.  Her baseline appears to be around 9.  Iron studies very low. - IV iron x 2 doses given in house.  Can resume home oral iron 325 mg daily at time of discharge   Acute pyelonephritis Percutaneous nephrostomy tube was exchanged yesterday   Hypokalemia Repleted and resolved   Chest pain This resolved.  She was evaluated by cardiology who recommended no further ischemic workup.   Chronic diastolic CHF (congestive heart failure) (HCC) Dyspnea improved with IV Lasix - Continue oral Lasix   Dyslipidemia - Continue Crestor   GERD without esophagitis - Continue pantoprazole  Discharge Instructions  Discharge Instructions     Diet - low sodium heart healthy   Complete by: As directed    Increase activity slowly   Complete by: As directed    No wound care   Complete by: As directed       Allergies as of 11/23/2022   No Known Allergies      Medication List     STOP taking these medications    midodrine 10 MG tablet Commonly known as: PROAMATINE   polyethylene glycol 17 g packet Commonly known as: MIRALAX / GLYCOLAX  TAKE these medications    acetaminophen 500 MG tablet Commonly known as: TYLENOL Take 500 mg by mouth every 6 (six) hours as needed for mild pain or fever.   albuterol 108 (90 Base) MCG/ACT inhaler Commonly known as: VENTOLIN HFA Inhale 2 puffs into the lungs every 6 (six) hours as needed for wheezing or shortness of breath.   FeroSul 325 (65 FE) MG tablet Generic drug: ferrous sulfate Take 325 mg by mouth daily with breakfast.   fluticasone furoate-vilanterol 100-25 MCG/ACT  Aepb Commonly known as: BREO ELLIPTA Inhale 1 puff into the lungs daily.   furosemide 20 MG tablet Commonly known as: Lasix Take 1 tablet (20 mg total) by mouth daily. Home med.   loperamide 2 MG capsule Commonly known as: IMODIUM Take 2 mg by mouth as needed for diarrhea or loose stools.   multivitamin with minerals Tabs tablet Take 1 tablet by mouth daily.   pantoprazole 40 MG tablet Commonly known as: PROTONIX Take 1 tablet (40 mg total) by mouth 2 (two) times daily.   rosuvastatin 10 MG tablet Commonly known as: CRESTOR Take 1 tablet (10 mg total) by mouth daily.   vitamin C 1000 MG tablet Take 1,000 mg by mouth daily.        Follow-up Information     [facility] Follow [facility].   Specialty: Home Health Services Why: They will follow up with you for your home health needs: Physical therapy and nursing. Contact information: [address] [place] [address] [phone]                No Known Allergies  Consultations: Cardiology IR   Procedures/Studies: IR NEPHROSTOMY EXCHANGE LEFT  Result Date: 11/21/2022 INDICATION: Chronic nephrostomy tube EXAM: Exchange of left nephrostomy tube using fluoroscopic guidance COMPARISON:  None MEDICATIONS: None ANESTHESIA/SEDATION: None CONTRAST:  8 mL Omnipaque 300-administered into the collecting system(s) FLUOROSCOPY TIME:  Fluoroscopy Time: 1.6 minutes (10 mGy) COMPLICATIONS: None immediate. PROCEDURE: Informed written consent was obtained from the patient after a thorough discussion of the procedural risks, benefits and alternatives. All questions were addressed. Maximal Sterile Barrier Technique was utilized including caps, mask, sterile gowns, sterile gloves, sterile drape, hand hygiene and skin antiseptic. A timeout was performed prior to the initiation of the procedure. The patient was placed prone on the exam table. The left flank was prepped and draped in the standard sterile fashion with  inclusion of the existing percutaneous nephrostomy tube within the sterile field. Initial injection of the nephrostomy tube was performed, demonstrating location within the renal pelvis. The nephrostomy tube was slightly retracted. The locking loop was released, and over an Amplatz wire, the nephrostomy tube was exchanged for a new, similar 10.2 French locking nephrostomy tube. Locking loop was formed in the renal pelvis. Location was confirmed with injection of contrast material. It was attached to bag drainage, and secured to the skin using a clean dressing. The patient tolerated the procedure well without immediate complication. IMPRESSION: Successful exchange of left percutaneous nephrostomy tube for a new, similar 10 French nephrostomy tube. Patient to return to Interventional Radiology in 8 weeks for routine exchange. Electronically Signed   By: Olive Bass M.D.   On: 11/21/2022 15:40   ECHOCARDIOGRAM COMPLETE  Result Date: 11/21/2022    ECHOCARDIOGRAM REPORT   Patient Name:   XOEY PEGLOW Date of Exam: 11/21/2022 Medical Rec #:  [id number]          Height:  59.0 in Accession #:    [id number]         Weight:       180.0 lb Date of Birth:  [birth year]          BSA:          1.764 m Patient Age:    66 years           BP:           122/41 mmHg Patient Gender: F                  HR:           96 bpm. Exam Location:  [facility] Procedure: 2D Echo, Cardiac Doppler and Color Doppler Indications:     Chet Pain  History:         Patient has prior history of Echocardiogram examinations, most                  recent 02/25/2022. CHF, COPD, Signs/Symptoms:Chest Pain, Dyspnea                  and Bacteremia; Risk Factors:Diabetes, Dyslipidemia,                  Hypertension and Sleep Apnea.  Sonographer:     Mikki Harbor Referring Phys:  [id number] Sondra Barges Diagnosing Phys: Julien Nordmann MD  Sonographer Comments: Technically difficult study due to poor echo windows. Image acquisition challenging due to COPD and  Image acquisition challenging due to respiratory motion. IMPRESSIONS  1. Left ventricular ejection fraction, by estimation, is 60 to 65%. The left ventricle has normal function. The left ventricle has no regional wall motion abnormalities. Left ventricular diastolic parameters are consistent with Grade I diastolic dysfunction (impaired relaxation).  2. Right ventricular systolic function is normal. The right ventricular size is normal. There is normal pulmonary artery systolic pressure. The estimated right ventricular systolic pressure is 17.6 mmHg.  3. The mitral valve is normal in structure. No evidence of mitral valve regurgitation. No evidence of mitral stenosis.  4. The aortic valve has an indeterminant number of cusps. Aortic valve regurgitation is not visualized. No aortic stenosis is present.  5. The inferior vena cava is normal in size with greater than 50% respiratory variability, suggesting right atrial pressure of 3 mmHg. FINDINGS  Left Ventricle: Left ventricular ejection fraction, by estimation, is 60 to 65%. The left ventricle has normal function. The left ventricle has no regional wall motion abnormalities. The left ventricular internal cavity size was normal in size. There is  no left ventricular hypertrophy. Left ventricular diastolic parameters are consistent with Grade I diastolic dysfunction (impaired relaxation). Right Ventricle: The right ventricular size is normal. No increase in right ventricular wall thickness. Right ventricular systolic function is normal. There is normal pulmonary artery systolic pressure. The tricuspid regurgitant velocity is 1.91 m/s, and  with an assumed right atrial pressure of 3 mmHg, the estimated right ventricular systolic pressure is 17.6 mmHg. Left Atrium: Left atrial size was normal in size. Right Atrium: Right atrial size was normal in size. Pericardium: There is no evidence of pericardial effusion. Mitral Valve: The mitral valve is normal in structure. No  evidence of mitral valve regurgitation. No evidence of mitral valve stenosis. MV peak gradient, 8.6 mmHg. The mean mitral valve gradient is 3.0 mmHg. Tricuspid Valve: The tricuspid valve is normal in structure. Tricuspid valve regurgitation is not demonstrated. No evidence of tricuspid stenosis. Aortic Valve: The aortic valve  has an indeterminant number of cusps. Aortic valve regurgitation is not visualized. No aortic stenosis is present. Aortic valve mean gradient measures 6.0 mmHg. Aortic valve peak gradient measures 12.0 mmHg. Aortic valve area, by VTI measures 3.02 cm. Pulmonic Valve: The pulmonic valve was normal in structure. Pulmonic valve regurgitation is not visualized. No evidence of pulmonic stenosis. Aorta: The aortic root is normal in size and structure. Venous: The inferior vena cava is normal in size with greater than 50% respiratory variability, suggesting right atrial pressure of 3 mmHg. IAS/Shunts: No atrial level shunt detected by color flow Doppler.  LEFT VENTRICLE PLAX 2D LVIDd:         4.00 cm   Diastology LVIDs:         2.30 cm   LV e' medial:    7.29 cm/s LV PW:         1.20 cm   LV E/e' medial:  9.7 LV IVS:        1.10 cm   LV e' lateral:   8.70 cm/s LVOT diam:     1.90 cm   LV E/e' lateral: 8.1 LV SV:         93 LV SV Index:   53 LVOT Area:     2.84 cm  RIGHT VENTRICLE RV Basal diam:  3.20 cm RV Mid diam:    2.80 cm RV S prime:     16.60 cm/s LEFT ATRIUM             Index        RIGHT ATRIUM          Index LA diam:        3.80 cm 2.15 cm/m   RA Area:     9.54 cm LA Vol (A2C):   59.7 ml 33.85 ml/m  RA Volume:   15.70 ml 8.90 ml/m LA Vol (A4C):   36.6 ml 20.75 ml/m LA Biplane Vol: 49.2 ml 27.90 ml/m  AORTIC VALVE                     PULMONIC VALVE AV Area (Vmax):    2.59 cm      PV Vmax:       1.23 m/s AV Area (Vmean):   2.58 cm      PV Peak grad:  6.1 mmHg AV Area (VTI):     3.02 cm AV Vmax:           173.00 cm/s AV Vmean:          111.000 cm/s AV VTI:            0.308 m AV Peak  Grad:      12.0 mmHg AV Mean Grad:      6.0 mmHg LVOT Vmax:         158.00 cm/s LVOT Vmean:        101.000 cm/s LVOT VTI:          0.328 m LVOT/AV VTI ratio: 1.06  AORTA Ao Root diam: 2.60 cm MITRAL VALVE                TRICUSPID VALVE MV Area (PHT): 3.85 cm     TR Peak grad:   14.6 mmHg MV Area VTI:   3.32 cm     TR Vmax:        191.00 cm/s MV Peak grad:  8.6 mmHg MV Mean grad:  3.0 mmHg     SHUNTS MV Vmax:  1.47 m/s     Systemic VTI:  0.33 m MV Vmean:      81.0 cm/s    Systemic Diam: 1.90 cm MV Decel Time: 197 msec MV E velocity: 70.40 cm/s MV A velocity: 141.00 cm/s MV E/A ratio:  0.50 Julien Nordmann MD Electronically signed by Julien Nordmann MD Signature Date/Time: 11/21/2022/12:21:09 PM    Final    CT ABDOMEN PELVIS W CONTRAST  Result Date: 11/21/2022 CLINICAL DATA:  Abdominal pain, acute, nonlocalized EXAM: CT ABDOMEN AND PELVIS WITH CONTRAST TECHNIQUE: Multidetector CT imaging of the abdomen and pelvis was performed using the standard protocol following bolus administration of intravenous contrast. RADIATION DOSE REDUCTION: This exam was performed according to the departmental dose-optimization program which includes automated exposure control, adjustment of the mA and/or kV according to patient size and/or use of iterative reconstruction technique. CONTRAST:  75mL OMNIPAQUE IOHEXOL 350 MG/ML SOLN COMPARISON:  CT angio chest 11/20/2022, CT abdomen pelvis 03/26/2022 FINDINGS: Lower chest: Please see separately dictated CT angio chest 11/20/2022. Hepatobiliary: The liver is mildly enlarged measuring up to 18 cm. No focal liver abnormality. No gallstones, gallbladder wall thickening, or pericholecystic fluid. No biliary dilatation. Pancreas: Diffusely atrophic. No focal lesion. Otherwise normal pancreatic contour. No surrounding inflammatory changes. No main pancreatic ductal dilatation. Spleen: Normal in size without focal abnormality. Adrenals/Urinary Tract: No adrenal nodule bilaterally. Left  percutaneous nephrostomy tube with pigtail terminating within the renal pelvis. Bilateral kidneys enhance symmetrically. No hydronephrosis. No hydroureter. 2 mm right nephrolithiasis. No left nephrolithiasis. Mild urothelial thickening of bilateral collecting system with slightly hazy urinary bladder wall contour and mild perivesicular fat stranding. The urinary bladder is unremarkable. On delayed imaging, there is no urothelial wall thickening and there are no filling defects in the opacified portions of the bilateral collecting systems or ureters. Stomach/Bowel: Stomach is within normal limits. No evidence of bowel wall thickening or dilatation. Under distended colon. Appendix appears normal. Vascular/Lymphatic: Inferior vena cava filter in appropriate position. No abdominal aorta or iliac aneurysm. Moderate atherosclerotic plaque of the aorta and its branches. No abdominal, pelvic, or inguinal lymphadenopathy. Reproductive: Uterus and bilateral adnexa are unremarkable. Other: No intraperitoneal free fluid. No intraperitoneal free gas. No organized fluid collection. Musculoskeletal: No abdominal wall hernia or abnormality. Diffuse subcutaneus soft tissue edema. No suspicious lytic or blastic osseous lesions. No acute displaced fracture. Severe degenerative changes of the bilateral hips. IMPRESSION: 1. Findings suggestive of bilateral collecting system and urinary bladder infection. Correlate with urinalysis. 2. Left nephrostomy tube in appropriate position. 3. Nonobstructive 2 mm right nephrolithiasis. 4. Mild hepatomegaly. 5. Severe bilateral degenerative changes of the hips. 6.  Aortic Atherosclerosis (ICD10-I70.0). Electronically Signed   By: Tish Frederickson M.D.   On: 11/21/2022 00:35   CT Angio Chest PE W and/or Wo Contrast  Result Date: 11/21/2022 CLINICAL DATA:  Pulmonary embolism (PE) suspected, high prob. Shortness of breath EXAM: CT ANGIOGRAPHY CHEST WITH CONTRAST TECHNIQUE: Multidetector CT imaging  of the chest was performed using the standard protocol during bolus administration of intravenous contrast. Multiplanar CT image reconstructions and MIPs were obtained to evaluate the vascular anatomy. RADIATION DOSE REDUCTION: This exam was performed according to the departmental dose-optimization program which includes automated exposure control, adjustment of the mA and/or kV according to patient size and/or use of iterative reconstruction technique. CONTRAST:  75mL OMNIPAQUE IOHEXOL 350 MG/ML SOLN COMPARISON:  10/24/2022 FINDINGS: Cardiovascular: No filling defects in the pulmonary arteries to suggest pulmonary emboli. Heart is normal size. Aorta is normal caliber. Scattered aortic calcifications.  Mediastinum/Nodes: No mediastinal, hilar, or axillary adenopathy. Trachea and esophagus are unremarkable. Thyroid unremarkable. Lungs/Pleura: No confluent airspace opacities or effusions. Upper Abdomen: No acute findings in the upper abdomen. Musculoskeletal: Chest wall soft tissues are unremarkable. No acute bony abnormality. Review of the MIP images confirms the above findings. IMPRESSION: No evidence of pulmonary embolus. No acute cardiopulmonary disease. Aortic Atherosclerosis (ICD10-I70.0). Electronically Signed   By: Charlett Nose M.D.   On: 11/21/2022 00:07   DG Chest 2 View  Result Date: 11/20/2022 CLINICAL DATA:  Shortness of breath EXAM: CHEST - 2 VIEW COMPARISON:  Chest radiograph dated 10/24/2022 FINDINGS: Normal lung volumes. No focal consolidations. No pleural effusion or pneumothorax. The heart size and mediastinal contours are within normal limits. No acute osseous abnormality. Partially imaged IVC filter. IMPRESSION: No active cardiopulmonary disease. Electronically Signed   By: Agustin Cree M.D.   On: 11/20/2022 18:57   CT Angio Chest PE W and/or Wo Contrast  Result Date: 10/24/2022 CLINICAL DATA:  Code sepsis. EXAM: CT ANGIOGRAPHY CHEST WITH CONTRAST TECHNIQUE: Multidetector CT imaging of the  chest was performed using the standard protocol during bolus administration of intravenous contrast. Multiplanar CT image reconstructions and MIPs were obtained to evaluate the vascular anatomy. RADIATION DOSE REDUCTION: This exam was performed according to the departmental dose-optimization program which includes automated exposure control, adjustment of the mA and/or kV according to patient size and/or use of iterative reconstruction technique. CONTRAST:  75mL OMNIPAQUE IOHEXOL 350 MG/ML SOLN COMPARISON:  CT scan 02/24/2022 FINDINGS: Cardiovascular: The heart is normal in size. No pericardial effusion. The aorta is normal in caliber. Stable scattered atherosclerotic calcifications. No dissection. The branch vessels are patent. Scattered coronary artery calcifications. The pulmonary arterial tree is well opacified. No filling defects to suggest pulmonary embolism. Mediastinum/Nodes: No mediastinal or hilar mass or lymphadenopathy. The esophagus is grossly normal. The thyroid gland is unremarkable. Lungs/Pleura: Patchy mosaic pattern of ground-glass attenuation most likely due to reactive airways disease such as asthma or constrictive bronchiolitis. Other possibilities would include cryptogenic organizing pneumonia or hypersensitivity pneumonitis. No focal pulmonary infiltrates or pleural effusions. Streaky areas of bibasilar subsegmental atelectasis. Upper Abdomen: No significant upper abdominal findings. Musculoskeletal: No significant bony findings. Review of the MIP images confirms the above findings. IMPRESSION: 1. No CT findings for pulmonary embolism. 2. Stable scattered atherosclerotic calcifications involving the aorta and coronary arteries. 3. Patchy mosaic pattern of ground-glass attenuation most likely due to reactive airways disease such as asthma or constrictive bronchiolitis. Other possibilities would include cryptogenic organizing pneumonia or hypersensitivity pneumonitis. 4. Streaky subsegmental  bibasilar atelectasis. Electronically Signed   By: Rudie Meyer M.D.   On: 10/24/2022 13:59   DG Chest Portable 1 View  Result Date: 10/24/2022 CLINICAL DATA:  Shortness of breath EXAM: PORTABLE CHEST 1 VIEW COMPARISON:  Chest x-ray dated August 07, 2022 FINDINGS: The heart size and mediastinal contours are within normal limits. Both lungs are clear. The visualized skeletal structures are unremarkable. IMPRESSION: No active disease. Electronically Signed   By: Allegra Lai M.D.   On: 10/24/2022 12:16      Subjective: Seen and examined on the day of discharge.  Stable no distress.  Appropriate for discharge home.  Home health services ordered.  Discharge Exam: Vitals:   11/23/22 0404 11/23/22 0938  BP: (!) 105/52 (!) 109/49  Pulse: 79 75  Resp: 16 16  Temp: 98.3 F (36.8 C) 98.2 F (36.8 C)  SpO2: 96% 98%   Vitals:   11/22/22 1649 11/22/22 1909 11/23/22  0404 11/23/22 0938  BP: (!) 98/47 (!) 113/53 (!) 105/52 (!) 109/49  Pulse: 77 81 79 75  Resp: 18 16 16 16   Temp: 98.4 F (36.9 C) 98.5 F (36.9 C) 98.3 F (36.8 C) 98.2 F (36.8 C)  TempSrc:  Oral Oral   SpO2: 98% 98% 96% 98%  Weight:      Height:        General: Pt is alert, awake, not in acute distress Cardiovascular: RRR, S1/S2 +, no rubs, no gallops Respiratory: CTA bilaterally, no wheezing, no rhonchi Abdominal: Soft, NT, ND, bowel sounds + Extremities: no edema, no cyanosis    The results of significant diagnostics from this hospitalization (including imaging, microbiology, ancillary and laboratory) are listed below for reference.     Microbiology: Recent Results (from the past 240 hour(s))  Blood culture (single)     Status: None (Preliminary result)   Collection Time: 11/20/22 11:10 PM   Specimen: BLOOD  Result Value Ref [place] Status   Specimen Description BLOOD LEFT ASSIST CONTROL  Final   Special Requests   Final    BOTTLES DRAWN AEROBIC AND ANAEROBIC Blood Culture adequate volume   Culture    Final    NO GROWTH 3 DAYS Performed at [facility], [address] [place], [place] [address]    Report Status PENDING  Incomplete  Blood culture (single)     Status: None (Preliminary result)   Collection Time: 11/21/22  3:04 AM   Specimen: BLOOD  Result Value Ref [place] Status   Specimen Description BLOOD RIGHT ASSIST CONTROL  Final   Special Requests   Final    BOTTLES DRAWN AEROBIC ONLY Blood Culture adequate volume   Culture   Final    NO GROWTH 2 DAYS Performed at [facility], [address] [place], [place] [address]    Report Status PENDING  Incomplete     Labs: BNP (last 3 results) Recent Labs    04/16/22 2325 10/24/22 1010 11/20/22 2141  BNP 87.4 54.1 41.4   Basic Metabolic Panel: Recent Labs  Lab 11/20/22 2141 11/21/22 0304 11/22/22 0606 11/23/22 0428  NA 138 137 140 139  K 3.3* 3.4* 3.1* 3.8  CL 103 106 108 109  CO2 24 22 25 25   GLUCOSE 146* 112* 137* 102*  BUN 19 18 18 16   CREATININE 0.94 0.85 1.04* 0.90  CALCIUM 8.9 8.3* 8.6* 8.7*   Liver Function Tests: Recent Labs  Lab 11/20/22 2141  AST 25  ALT 12  ALKPHOS 51  BILITOT 1.0  PROT 7.1  ALBUMIN 3.4*   No results for input(s): "LIPASE", "AMYLASE" in the last 168 hours. No results for input(s): "AMMONIA" in the last 168 hours. CBC: Recent Labs  Lab 11/20/22 1809 11/21/22 0304 11/21/22 0526 11/21/22 2000 11/22/22 0606 11/23/22 0428  WBC 4.3 3.9*  --   --  3.4* 2.8*  HGB 8.5* 6.8* 6.6* 8.2* 8.0* 8.4*  HCT 29.5* 23.3* 22.5* 27.4* 26.4* 27.8*  MCV 87.5 86.3  --   --  83.3 83.5  PLT 303 267  --   --  231 267   Cardiac Enzymes: No results for input(s): "CKTOTAL", "CKMB", "CKMBINDEX", "TROPONINI" in the last 168 hours. BNP: Invalid input(s): "POCBNP" CBG: No results for input(s): "GLUCAP" in the last 168 hours. D-Dimer Recent Labs    11/20/22 2141  DDIMER 1.28*   Hgb A1c No results for input(s): "HGBA1C" in the last 72 hours. Lipid Profile No results for  input(s): "CHOL", "HDL", "  LDLCALC", "TRIG", "CHOLHDL", "LDLDIRECT" in the last 72 hours. Thyroid function studies No results for input(s): "TSH", "T4TOTAL", "T3FREE", "THYROIDAB" in the last 72 hours.  Invalid input(s): "FREET3" Anemia work up Recent Labs    11/22/22 0606  FERRITIN 19  TIBC 279  IRON 23*   Urinalysis    Component Value Date/Time   COLORURINE YELLOW (A) 11/20/2022 2151   APPEARANCEUR HAZY (A) 11/20/2022 2151   APPEARANCEUR Cloudy (A) 01/03/2022 1443   LABSPEC 1.011 11/20/2022 2151   PHURINE 7.0 11/20/2022 2151   GLUCOSEU NEGATIVE 11/20/2022 2151   HGBUR NEGATIVE 11/20/2022 2151   BILIRUBINUR NEGATIVE 11/20/2022 2151   BILIRUBINUR Negative 01/03/2022 1443   KETONESUR NEGATIVE 11/20/2022 2151   PROTEINUR NEGATIVE 11/20/2022 2151   NITRITE NEGATIVE 11/20/2022 2151   LEUKOCYTESUR MODERATE (A) 11/20/2022 2151   Sepsis Labs Recent Labs  Lab 11/20/22 1809 11/21/22 0304 11/22/22 0606 11/23/22 0428  WBC 4.3 3.9* 3.4* 2.8*   Microbiology Recent Results (from the past 240 hour(s))  Blood culture (single)     Status: None (Preliminary result)   Collection Time: 11/20/22 11:10 PM   Specimen: BLOOD  Result Value Ref Range Status   Specimen Description BLOOD LEFT ASSIST CONTROL  Final   Special Requests   Final    BOTTLES DRAWN AEROBIC AND ANAEROBIC Blood Culture adequate volume   Culture   Final    NO GROWTH 3 DAYS Performed at [facility], [address] [place], [place] [address]    Report Status PENDING  Incomplete  Blood culture (single)     Status: None (Preliminary result)   Collection Time: 11/21/22  3:04 AM   Specimen: BLOOD  Result Value Ref Range Status   Specimen Description BLOOD RIGHT ASSIST CONTROL  Final   Special Requests   Final    BOTTLES DRAWN AEROBIC ONLY Blood Culture adequate volume   Culture   Final    NO GROWTH 2 DAYS Performed at [facility], [address] [place], [place] [address]    Report Status  PENDING  Incomplete     Time coordinating discharge: Over 30 minutes  SIGNED:   Tresa Moore, MD  [facility] 11/23/2022, 11:10 AM Pager   If 7PM-7AM, please contact night-coverage

## 2022-11-23 NOTE — TOC Transition Note (Signed)
Transition of Care [facility]) - CM/SW Discharge Note   Patient Details  Name: Courtney Mack MRN: [id number] Date of Birth: 02/18/1956  [facility] of [facility]) CM/SW Contact:  Liliana Cline, LCSW Phone Number: 11/23/2022, 10:00 AM   Clinical Narrative:    Patient has orders to DC home today. CSW notified Desiree with [facility].    Final next level of care: Home w Home Health Services Barriers to Discharge: Barriers Resolved   Patient Goals and CMS Choice      Discharge Placement                         Discharge Plan and Services Additional resources added to the After Visit Summary for       Post Acute Care Choice: NA                    HH Arranged: PT, RN [facility] Agency: [facility] Date [facility] Agency Contacted: 11/23/22   Representative spoke with at [facility] Agency: Desiree  Social Determinants of Health (SDOH) Interventions SDOH Screenings   Food Insecurity: No Food Insecurity (11/21/2022)  Housing: Low Risk  (11/21/2022)  Transportation Needs: No Transportation Needs (11/21/2022)  Utilities: Not At Risk (11/21/2022)  Depression (PHQ2-9): Low Risk  (04/11/2022)  Tobacco Use: Low Risk  (11/21/2022)     Readmission Risk Interventions    11/21/2022   12:18 PM 06/12/2022   11:45 AM 04/18/2022    2:50 PM  Readmission Risk Prevention Plan  Transportation Screening Complete Complete Complete  Medication Review Oceanographer) Complete Complete Complete  PCP or Specialist appointment within 3-5 days of discharge Complete Complete Complete  HRI or Home Care Consult  Complete   SW Recovery Care/Counseling Consult Complete Complete Complete  Palliative Care Screening Not Applicable Not Applicable Not Applicable  Skilled Nursing Facility Not Applicable Not Applicable Not Applicable

## 2022-11-24 LAB — CULTURE, BLOOD (SINGLE): Culture: NO GROWTH

## 2022-11-24 LAB — BPAM RBC
Blood Product Expiration Date: 202406092359
Unit Type and Rh: 5100

## 2022-11-24 LAB — TYPE AND SCREEN

## 2022-11-25 LAB — TYPE AND SCREEN
ABO/RH(D): A POS
Antibody Screen: POSITIVE
Unit division: 0
Unit division: 0
Unit division: 0

## 2022-11-25 LAB — BPAM RBC
Blood Product Expiration Date: 202406052359
ISSUE DATE / TIME: 202405161218
Unit Type and Rh: 6200
Unit Type and Rh: 6200

## 2022-11-25 LAB — CULTURE, BLOOD (SINGLE)

## 2022-11-25 NOTE — Telephone Encounter (Signed)
History of recurrent UTIs and sepsis and left ureteral obstruction managed with an indwelling nephrostomy tube.  She has been thoroughly evaluated and unfortunately nephrostomy tube drainage is her best option.

## 2022-11-26 LAB — CULTURE, BLOOD (SINGLE): Special Requests: ADEQUATE

## 2022-12-11 ENCOUNTER — Inpatient Hospital Stay: Payer: Medicare HMO | Attending: Oncology

## 2022-12-11 ENCOUNTER — Inpatient Hospital Stay: Payer: Medicare HMO

## 2022-12-11 DIAGNOSIS — Z86711 Personal history of pulmonary embolism: Secondary | ICD-10-CM | POA: Insufficient documentation

## 2022-12-11 DIAGNOSIS — Z87891 Personal history of nicotine dependence: Secondary | ICD-10-CM | POA: Diagnosis not present

## 2022-12-11 DIAGNOSIS — Z86718 Personal history of other venous thrombosis and embolism: Secondary | ICD-10-CM | POA: Diagnosis not present

## 2022-12-11 DIAGNOSIS — Z8542 Personal history of malignant neoplasm of other parts of uterus: Secondary | ICD-10-CM | POA: Insufficient documentation

## 2022-12-11 DIAGNOSIS — R7989 Other specified abnormal findings of blood chemistry: Secondary | ICD-10-CM | POA: Insufficient documentation

## 2022-12-11 DIAGNOSIS — Z7901 Long term (current) use of anticoagulants: Secondary | ICD-10-CM | POA: Diagnosis not present

## 2022-12-11 DIAGNOSIS — Z923 Personal history of irradiation: Secondary | ICD-10-CM | POA: Diagnosis not present

## 2022-12-11 DIAGNOSIS — D500 Iron deficiency anemia secondary to blood loss (chronic): Secondary | ICD-10-CM

## 2022-12-11 DIAGNOSIS — I82409 Acute embolism and thrombosis of unspecified deep veins of unspecified lower extremity: Secondary | ICD-10-CM

## 2022-12-11 DIAGNOSIS — D509 Iron deficiency anemia, unspecified: Secondary | ICD-10-CM | POA: Insufficient documentation

## 2022-12-11 DIAGNOSIS — C541 Malignant neoplasm of endometrium: Secondary | ICD-10-CM

## 2022-12-11 LAB — CBC WITH DIFFERENTIAL/PLATELET
Abs Immature Granulocytes: 0.01 10*3/uL (ref 0.00–0.07)
Basophils Absolute: 0 10*3/uL (ref 0.0–0.1)
Basophils Relative: 1 %
Eosinophils Absolute: 0.2 10*3/uL (ref 0.0–0.5)
Eosinophils Relative: 4 %
HCT: 28.6 % — ABNORMAL LOW (ref 36.0–46.0)
Hemoglobin: 8.6 g/dL — ABNORMAL LOW (ref 12.0–15.0)
Immature Granulocytes: 0 %
Lymphocytes Relative: 15 %
Lymphs Abs: 0.7 10*3/uL (ref 0.7–4.0)
MCH: 26 pg (ref 26.0–34.0)
MCHC: 30.1 g/dL (ref 30.0–36.0)
MCV: 86.4 fL (ref 80.0–100.0)
Monocytes Absolute: 0.3 10*3/uL (ref 0.1–1.0)
Monocytes Relative: 6 %
Neutro Abs: 3.3 10*3/uL (ref 1.7–7.7)
Neutrophils Relative %: 74 %
Platelets: 232 10*3/uL (ref 150–400)
RBC: 3.31 MIL/uL — ABNORMAL LOW (ref 3.87–5.11)
RDW: 19.9 % — ABNORMAL HIGH (ref 11.5–15.5)
WBC: 4.4 10*3/uL (ref 4.0–10.5)
nRBC: 0 % (ref 0.0–0.2)

## 2022-12-11 LAB — IRON AND TIBC
Iron: 23 ug/dL — ABNORMAL LOW (ref 28–170)
Saturation Ratios: 7 % — ABNORMAL LOW (ref 10.4–31.8)
TIBC: 351 ug/dL (ref 250–450)
UIBC: 328 ug/dL

## 2022-12-11 LAB — FERRITIN: Ferritin: 19 ng/mL (ref 11–307)

## 2022-12-16 ENCOUNTER — Inpatient Hospital Stay: Payer: Medicare HMO

## 2022-12-16 ENCOUNTER — Encounter: Payer: Self-pay | Admitting: Oncology

## 2022-12-16 ENCOUNTER — Inpatient Hospital Stay (HOSPITAL_BASED_OUTPATIENT_CLINIC_OR_DEPARTMENT_OTHER): Payer: Medicare HMO | Admitting: Oncology

## 2022-12-16 VITALS — BP 109/63 | HR 85 | Temp 97.7°F | Resp 18 | Wt 183.0 lb

## 2022-12-16 VITALS — BP 105/63 | HR 81

## 2022-12-16 DIAGNOSIS — C541 Malignant neoplasm of endometrium: Secondary | ICD-10-CM | POA: Diagnosis not present

## 2022-12-16 DIAGNOSIS — D500 Iron deficiency anemia secondary to blood loss (chronic): Secondary | ICD-10-CM

## 2022-12-16 DIAGNOSIS — I82409 Acute embolism and thrombosis of unspecified deep veins of unspecified lower extremity: Secondary | ICD-10-CM

## 2022-12-16 DIAGNOSIS — D509 Iron deficiency anemia, unspecified: Secondary | ICD-10-CM | POA: Diagnosis not present

## 2022-12-16 MED ORDER — SODIUM CHLORIDE 0.9 % IV SOLN
INTRAVENOUS | Status: DC | PRN
  Filled 2022-12-16: qty 250

## 2022-12-16 MED ORDER — SODIUM CHLORIDE 0.9 % IV SOLN
200.0000 mg | Freq: Once | INTRAVENOUS | Status: AC
  Administered 2022-12-16: 200 mg via INTRAVENOUS
  Filled 2022-12-16: qty 200

## 2022-12-16 NOTE — Assessment & Plan Note (Addendum)
#  Iron deficiency anemia,  Labs are reviewed and discussed with patient. Lab Results  Component Value Date   HGB 8.6 (L) 12/11/2022   TIBC 351 12/11/2022   IRONPCTSAT 7 (L) 12/11/2022   FERRITIN 19 12/11/2022    Recommend  IV Venofer weekly x 4 Recommend patient to reestablish care with gastroenterology.

## 2022-12-16 NOTE — Progress Notes (Signed)
Hematology/Oncology Progress note [phone] [phone]      Patient Care Team: Center, [facility] as PCP - General (General Practice) [name], RN as Oncology Nurse Navigator Rickard Patience, MD as Consulting Physician (Hematology and Oncology)  ASSESSMENT & PLAN:   IDA (iron deficiency anemia) #Iron deficiency anemia,  Labs are reviewed and discussed with patient. Lab Results  Component Value Date   HGB 8.6 (L) 12/11/2022   TIBC 351 12/11/2022   IRONPCTSAT 7 (L) 12/11/2022   FERRITIN 19 12/11/2022    Recommend  IV Venofer weekly x 4 Recommend patient to reestablish care with gastroenterology.  Recurrent deep vein thrombosis (DVT) (HCC) S/P IVC, Not able to tolerate anticoagulation due to high bleeding risk.   Endometrial cancer (HCC) Stage II endometrial cancer with cervical involvement or cervical cancer.  Not a candidate of surgery and chemotherapy.  Status post radiation and brachytherapy. She is overdue for follow up with gynecology oncology Recent CT abdomen pelvis showed no evidence of recurrence.   Orders Placed This Encounter  Procedures   CBC with Differential (Cancer Center Only)    Standing Status:   Future    Standing Expiration Date:   12/16/2023   Iron and TIBC    Standing Status:   Future    Standing Expiration Date:   12/16/2023   Ferritin    Standing Status:   Future    Standing Expiration Date:   12/16/2023   Follow up in 3 months.  All questions were answered. The patient knows to call the clinic with any problems, questions or concerns.  Rickard Patience, MD, PhD [facility] Hematology Oncology 12/16/2022   CHIEF COMPLAINTS/REASON FOR VISIT:   endometrial cancer, iron deficiency anemia, recurrent thrombosis  HISTORY OF PRESENTING ILLNESS:   Courtney Mack is a  67 y.o.  female with PMH listed below was seen in consultation at the request of  [facility]  for evaluation of endometrial  cancer.  #February 2022, patient was hospitalized to due to left lower extremity DVT, extensive bilateral PE, status post embolectomy and IVC filter placement.  Patient was discharged on anticoagulation with Xarelto. Patient reports that she has had intermittent vaginal bleeding for the past few years.  After she was started on Xarelto, she started to experience frequent vaginal bleeding 09/06/2020, US pelvic complete showed a diffusely thickened and heterogeneous appearance of the endometrium, concerning for endometrial carcinoma.-  Uterus: Measurements: 10.4 x 5.2 x 5.9 cm = volume: 166 mL. No fibroids or other mass visualized. Endometrium Thickness: 29. The endometrium is diffusely heterogeneous and thickened with increased vascularity.Right and left ovaries: Not visualized.  No abnormal free fluid  Patient was evaluated by gynecology Dr. Logan Bores.  On physical examination, patient had malodorous friable necrotic appearing cervix was seen. Cervical biopsy performed. An endometrial biopsy was attempted but the cervical os was not well visualized.   # 09/06/2020 Cervical and endometrial biopsies A. UTERINE CERVIX; BIOPSY:   ENDOMETRIOID ENDOMETRIAL ADENOCARCINOMA, FAVOR LOW GRADE (FIGO GRADE 1-2).   B. ENDOMETRIUM; BIOPSY: - ENDOMETRIOID ENDOMETRIAL ADENOCARCINOMA, FAVOR LOW GRADE (FIGO GRADE 1-2).  She presents for evaluation. She continues to have bleeding.  Patient was seen by gynecology oncology Dr. Sonia Side on 09/13/2020.  Patient is not a surgical candidate.  Refer to radiation oncology and my dog for further evaluation.  Patient presented to emergency room on 09/16/2020, 09/20/2020 for vaginal bleeding and passing clots.. 09/16/2020, hemoglobin 9.7, MCV 80 09/20/2020, hemoglobin 8.8, MCV 80.5. 08/24/2020,  iron saturation 5, ferritin 12.  Today patient has been seen by radiation oncology.  PET scan has been scheduled on 09/28/2020. Due to the ongoing bleeding, decision was made to proceed with radiation  on 09/25/2020. She reports very tired and fatigued.  Ongoing vaginal bleeding and passing blood clots..  Denies any chest pain, shortness of breath.Some left lower abdomen discomfort.  Chronic other medical problems include hypertension, morbid obesity, diabetes, chronic anticoagulation for recent bilateral extensive PE and a DVT.  01/02/2021 - 01/05/2021, patient was hospitalized due to worsening of bilateral lower extremity swelling.  She was found to have extensive occlusive thrombus throughout both femoral vein.  CT angiogram of the chest also showed small volume of chronic and nonocclusive PE. Patient was started on heparin drip.  Status post embolectomy by vascular surgeon.  Patient at that time claimed that she has been compliant with Xarelto.  After discussing with hospitalist, decision was made to switch patient to Lovenox injection after discharge.  Prescription was sent by hospitalist.  [employer] initially did not approve her Lovenox.  Patient ended up restarted on Xarelto for anticoagulation. Today patient was accompanied by her brother. Per patient and her brother, family realized that patient had ran out of Xarelto for about at least 2 weeks prior to the onset of her symptoms. With additional history, it is possible that patient developed a recurrent thrombosis due to not being on anticoagulation.  Patient was recommended to resume Xarelto 20 mg daily.  11/16/2021 - 11/19/2021, patient was admitted due to gross hematuria.  Patient is anemic is status post PRBC transfusion. During this admission, patient had a CT scan done which showed small clot burden/pulm emboli in the right posterior pulmonary artery.  Ultrasound Doppler showed acute on chronic DVT.  Patient has been off Xarelto due to hematuria prior to the admission for approximately 1 months.  Hematuria was attributed to her ureteral stents.  Patient was started on heparin drip.Patient was evaluated by urology. S/p cystoscopy with  removal and replacement of left double-J stent..   hematuria likely secondary to radiation cystitis.  Patient also  was treated for UTI with Rocephin.  Urine and blood cultures were negative.  At discharge, patient was resumed on Xarelto 10 mg daily.  12/25/21 Venous US bilaterally lower extremities 1. Stable venous duplex of the bilateral lower extremities. Again noted is incomplete compressibility of the bilateral common femoral veins, profunda femoral veins, femoral veins and popliteal veins. Findings are most compatible with the sequelae of previous DVT. No evidence to suggest acute thrombosis. 2. Limited evaluation of the deep calf veins bilaterally.  02/24/22 Venous US bilaterally lower extremities Chronic partially occlusive thrombus noted within the right common femoral vein and proximal superficial femoral vein. Occlusive thrombus noted in the mid to distal right femoral vein. This was partially occluded previously suggesting an acute component. Chronic partially occlusive thrombus in the left common femoral vein, superficial femoral vein and popliteal vein.  03/26/22-03/29/22 Hospitalization due to complicated UTI, Bilateral hydronephrosis,Underwent nephrogram and nephrostomy tube exchange, acute on chronic anemia, s/p PRBC transfusion. Xarelto was held and resume after discharge.   04/16/22 -04/20/22 patient was hospitalized due to acute GI bleeding due to GAVE Hgb 4.8 on presentation.  Xarelto was discontinued..  S/p 3u pRBC during this hospitalization.  08/07/2022 - 08/10/2022 hospitalization due to GAVE, GI bleeding  INTERVAL HISTORY Courtney Mack is a 67 y.o. female who has above history reviewed by me today presents for posthospitalization follow-up for recurrent thrombosis.  Gross hematuria, history of GI bleeding, history of endometrial cancer status post radiation and brachytherapy.  Today patient presents for posthospitalization follow-up. She denies any additional episodes  of bleeding events.  + fatigue.  Hospitalization 11/20/2022 - 11/23/2022 sepsis due to gram-negative UTI, acute pyelonephritis.  Hemoglobin was low at 6 and patient received PRBC transfusion during admission.  11/20/2022 CT abdomen pelvis with contrast showed 1. Findings suggestive of bilateral collecting system and urinary bladder infection. Correlate with urinalysis. 2. Left nephrostomy tube in appropriate position. 3. Nonobstructive 2 mm right nephrolithiasis. 4. Mild hepatomegaly. 5. Severe bilateral degenerative changes of the hips. 6.  Aortic Atherosclerosis   Review of Systems  Constitutional:  Negative for appetite change, chills, fatigue and fever.  HENT:   Negative for hearing loss and voice change.   Eyes:  Negative for eye problems.  Respiratory:  Negative for chest tightness and cough.   Cardiovascular:  Positive for leg swelling. Negative for chest pain.  Gastrointestinal:  Negative for abdominal distention, abdominal pain and blood in stool.  Endocrine: Negative for hot flashes.  Genitourinary:  Negative for difficulty urinating, frequency and vaginal bleeding.   Musculoskeletal:  Negative for arthralgias.  Skin:  Negative for itching and rash.  Neurological:  Negative for extremity weakness.  Hematological:  Negative for adenopathy.  Psychiatric/Behavioral:  Negative for confusion.       MEDICAL HISTORY:  Past Medical History:  Diagnosis Date   Acute bilateral deep vein thrombosis (DVT) of femoral veins (HCC) 01/02/2021   Acute massive pulmonary embolism (HCC) 08/25/2020   Last Assessment & Plan:  Formatting of this note might be different from the original. Post vena caval filter and on xarelto and O2   Anemia    ARF (acute respiratory failure) (HCC)    Cellulitis of left lower leg 06/20/2021   CHF (congestive heart failure) (HCC)    COVID-19    Diabetes mellitus without complication (HCC)    DVT (deep venous thrombosis) (HCC) 09/06/2020   Endometrial cancer  (HCC)    GAVE (gastric antral vascular ectasia)    GERD (gastroesophageal reflux disease)    High cholesterol    Hx of blood clots    Hyperlipidemia    Hypertension    IDA (iron deficiency anemia) 09/21/2020   Obesity    Pneumonia 06/11/2022   recovered   Pressure injury of skin 06/21/2021   Pulmonary embolism (HCC)    Sepsis (HCC)    Thrombocytopenia (HCC)    Urinary tract infection 09/13/2021    SURGICAL HISTORY: Past Surgical History:  Procedure Laterality Date   CATARACT EXTRACTION W/PHACO Right 06/04/2022   Procedure: CATARACT EXTRACTION PHACO AND INTRAOCULAR LENS PLACEMENT (IOC) COMPLICATED RIGHT  31.83  02:17.4;  Surgeon: Galen Manila, MD;  Location: [facility];  Service: Ophthalmology;  Laterality: Right;   CATARACT EXTRACTION W/PHACO Left 07/09/2022   Procedure: CATARACT EXTRACTION PHACO AND INTRAOCULAR LENS PLACEMENT (IOC);  Surgeon: Galen Manila, MD;  Location: [facility];  Service: Ophthalmology;  Laterality: Left;  22.75 1:40.3   COLONOSCOPY N/A 06/21/2021   Procedure: COLONOSCOPY;  Surgeon: Toledo, Boykin Nearing, MD;  Location: [facility] ENDOSCOPY;  Service: Gastroenterology;  Laterality: N/A;   CYSTOSCOPY W/ URETERAL STENT PLACEMENT Left 09/11/2021   Procedure: CYSTOSCOPY WITH RETROGRADE PYELOGRAM/URETERAL STENT PLACEMENT;  Surgeon: Riki Altes, MD;  Location: [facility] ORS;  Service: Urology;  Laterality: Left;   CYSTOSCOPY W/ URETERAL STENT PLACEMENT Left 11/17/2021   Procedure: CYSTOSCOPY WITH STENT REPLACEMENT;  Surgeon: Bjorn Pippin, MD;  Location: [facility]  ORS;  Service: Urology;  Laterality: Left;   CYSTOSCOPY WITH FULGERATION  11/17/2021   Procedure: CYSTOSCOPY WITH FULGERATION;  Surgeon: Bjorn Pippin, MD;  Location: [facility] ORS;  Service: Urology;;   CYSTOSCOPY WITH STENT PLACEMENT Left 10/17/2021   Procedure: CYSTOSCOPY WITH STENT PLACEMENT;  Surgeon: Riki Altes, MD;  Location: [facility] ORS;  Service: Urology;  Laterality: Left;   CYSTOSCOPY WITH  URETEROSCOPY, STONE BASKETRY AND STENT PLACEMENT Left 10/16/2021   Procedure: CYSTOSCOPY WITH URETEROSCOPY  AND STENT REMOVAL;  Surgeon: Riki Altes, MD;  Location: [facility] ORS;  Service: Urology;  Laterality: Left;   ENTEROSCOPY N/A 08/17/2021   Procedure: ENTEROSCOPY;  Surgeon: Toney Reil, MD;  Location: [facility] ENDOSCOPY;  Service: Gastroenterology;  Laterality: N/A;   ESOPHAGOGASTRODUODENOSCOPY N/A 06/21/2021   Procedure: ESOPHAGOGASTRODUODENOSCOPY (EGD);  Surgeon: Toledo, Boykin Nearing, MD;  Location: [facility] ENDOSCOPY;  Service: Gastroenterology;  Laterality: N/A;   ESOPHAGOGASTRODUODENOSCOPY (EGD) WITH PROPOFOL N/A 08/17/2021   Procedure: ESOPHAGOGASTRODUODENOSCOPY (EGD) WITH PROPOFOL;  Surgeon: Toney Reil, MD;  Location: [facility] ENDOSCOPY;  Service: Gastroenterology;  Laterality: N/A;   ESOPHAGOGASTRODUODENOSCOPY (EGD) WITH PROPOFOL N/A 10/14/2021   Procedure: ESOPHAGOGASTRODUODENOSCOPY (EGD) WITH PROPOFOL;  Surgeon: Midge Minium, MD;  Location: [facility] ENDOSCOPY;  Service: Endoscopy;  Laterality: N/A;   ESOPHAGOGASTRODUODENOSCOPY (EGD) WITH PROPOFOL N/A 02/01/2022   Procedure: ESOPHAGOGASTRODUODENOSCOPY (EGD) WITH PROPOFOL;  Surgeon: Toney Reil, MD;  Location: [facility] ENDOSCOPY;  Service: Gastroenterology;  Laterality: N/A;   ESOPHAGOGASTRODUODENOSCOPY (EGD) WITH PROPOFOL N/A 03/15/2022   Procedure: ESOPHAGOGASTRODUODENOSCOPY (EGD) WITH PROPOFOL;  Surgeon: Wyline Mood, MD;  Location: [facility] ENDOSCOPY;  Service: Gastroenterology;  Laterality: N/A;   ESOPHAGOGASTRODUODENOSCOPY (EGD) WITH PROPOFOL N/A 04/18/2022   Procedure: ESOPHAGOGASTRODUODENOSCOPY (EGD) WITH PROPOFOL;  Surgeon: Toney Reil, MD;  Location: [facility] ENDOSCOPY;  Service: Gastroenterology;  Laterality: N/A;   ESOPHAGOGASTRODUODENOSCOPY (EGD) WITH PROPOFOL N/A 08/09/2022   Procedure: ESOPHAGOGASTRODUODENOSCOPY (EGD) WITH PROPOFOL;  Surgeon: Wyline Mood, MD;  Location: [facility] ENDOSCOPY;  Service: Gastroenterology;  Laterality:  N/A;   GIVENS CAPSULE STUDY N/A 06/22/2021   Procedure: GIVENS CAPSULE STUDY;  Surgeon: Toledo, Boykin Nearing, MD;  Location: [facility] ENDOSCOPY;  Service: Gastroenterology;  Laterality: N/A;   IR NEPHROSTOMY EXCHANGE LEFT  02/25/2022   IR NEPHROSTOMY EXCHANGE LEFT  03/27/2022   IR NEPHROSTOMY EXCHANGE LEFT  05/22/2022   IR NEPHROSTOMY EXCHANGE LEFT  07/24/2022   IR NEPHROSTOMY EXCHANGE LEFT  09/24/2022   IR NEPHROSTOMY EXCHANGE LEFT  11/21/2022   IR NEPHROSTOMY PLACEMENT LEFT  12/27/2021   IVC FILTER INSERTION N/A 08/18/2020   Procedure: IVC FILTER INSERTION;  Surgeon: Renford Dills, MD;  Location: [facility] INVASIVE CV LAB;  Service: Cardiovascular;  Laterality: N/A;   PERIPHERAL VASCULAR THROMBECTOMY Bilateral 01/03/2021   Procedure: PERIPHERAL VASCULAR THROMBECTOMY;  Surgeon: Annice Needy, MD;  Location: [facility] INVASIVE CV LAB;  Service: Cardiovascular;  Laterality: Bilateral;   PULMONARY THROMBECTOMY N/A 08/18/2020   Procedure: PULMONARY THROMBECTOMY;  Surgeon: Renford Dills, MD;  Location: [facility] INVASIVE CV LAB;  Service: Cardiovascular;  Laterality: N/A;   TUBAL LIGATION     VISCERAL ANGIOGRAPHY N/A 07/18/2021   Procedure: VISCERAL ANGIOGRAPHY;  Surgeon: Annice Needy, MD;  Location: [facility] INVASIVE CV LAB;  Service: Cardiovascular;  Laterality: N/A;    SOCIAL HISTORY: Social History   Socioeconomic History   Marital status: Single    Spouse name: Not on file   Number of children: Not on file   Years of education: Not on file   Highest education level: Not on file  Occupational History  Not on file  Tobacco Use   Smoking status: Never   Smokeless tobacco: Never   Tobacco comments:    smoked 2-4 cigarettes for about 2-3 weeks   Vaping Use   Vaping Use: Never used  Substance and Sexual Activity   Alcohol use: Not Currently   Drug use: Never   Sexual activity: Not Currently  Other Topics Concern   Not on file  Social History Narrative   Not on file   Social Determinants of Health    Financial Resource Strain: Not on file  Food Insecurity: No Food Insecurity (11/21/2022)   Hunger Vital Sign    Worried About Running Out of Food in the Last Year: Never true    Ran Out of Food in the Last Year: Never true  Transportation Needs: No Transportation Needs (11/21/2022)   PRAPARE - Administrator, Civil Service (Medical): No    Lack of Transportation (Non-Medical): No  Physical Activity: Not on file  Stress: Not on file  Social Connections: Not on file  Intimate Partner Violence: Not At Risk (11/21/2022)   Humiliation, Afraid, Rape, and Kick questionnaire    Fear of Current or Ex-Partner: No    Emotionally Abused: No    Physically Abused: No    Sexually Abused: No    FAMILY HISTORY: Family History  Problem Relation Age of Onset   Hypertension Mother    Diabetes Mother    Diabetes Father    Hypertension Father    High Cholesterol Father    Congestive Heart Failure Father    Breast cancer Cousin     ALLERGIES:  has No Known Allergies.  MEDICATIONS:  Current Outpatient Medications  Medication Sig Dispense Refill   acetaminophen (TYLENOL) 500 MG tablet Take 500 mg by mouth every 6 (six) hours as needed for mild pain or fever.     albuterol (VENTOLIN HFA) 108 (90 Base) MCG/ACT inhaler Inhale 2 puffs into the lungs every 6 (six) hours as needed for wheezing or shortness of breath. 8 g 0   Ascorbic Acid (VITAMIN C) 1000 MG tablet Take 1,000 mg by mouth daily.     FEROSUL 325 (65 Fe) MG tablet Take 325 mg by mouth daily with breakfast.     fluticasone furoate-vilanterol (BREO ELLIPTA) 100-25 MCG/ACT AEPB Inhale 1 puff into the lungs daily.     furosemide (LASIX) 20 MG tablet Take 1 tablet (20 mg total) by mouth daily. Home med. 30 tablet 3   loperamide (IMODIUM) 2 MG capsule Take 2 mg by mouth as needed for diarrhea or loose stools.     Multiple Vitamin (MULTIVITAMIN WITH MINERALS) TABS tablet Take 1 tablet by mouth daily.     pantoprazole (PROTONIX) 40 MG  tablet Take 1 tablet (40 mg total) by mouth 2 (two) times daily. 60 tablet 1   rosuvastatin (CRESTOR) 10 MG tablet Take 1 tablet (10 mg total) by mouth daily. 15 tablet 0   No current facility-administered medications for this visit.     PHYSICAL EXAMINATION: ECOG PERFORMANCE STATUS: 2 - Symptomatic, <50% confined to bed Vitals:   12/16/22 1325  BP: 109/63  Pulse: 85  Resp: 18  Temp: 97.7 F (36.5 C)  SpO2: 100%   Filed Weights   12/16/22 1325  Weight: 183 lb (83 kg)     Physical Exam Constitutional:      General: She is not in acute distress.    Appearance: She is obese.     Comments: Patient sits  in the wheelchair  HENT:     Head: Normocephalic and atraumatic.  Eyes:     General: No scleral icterus. Cardiovascular:     Rate and Rhythm: Normal rate and regular rhythm.     Heart sounds: Normal heart sounds.  Pulmonary:     Effort: Pulmonary effort is normal. No respiratory distress.     Breath sounds: No wheezing.  Abdominal:     General: Bowel sounds are normal. There is no distension.     Palpations: Abdomen is soft.  Genitourinary:    Comments:  left nephrostomy tube in place Musculoskeletal:        General: Swelling present.     Cervical back: Normal range of motion and neck supple.     Comments: Lower extremities  edema bilaterally  Skin:    General: Skin is warm and dry.     Coloration: Skin is not pale.     Findings: No erythema or rash.  Neurological:     Mental Status: She is alert and oriented to person, place, and time. Mental status is at baseline.     Cranial Nerves: No cranial nerve deficit.     Coordination: Coordination normal.  Psychiatric:        Mood and Affect: Mood normal.     LABORATORY DATA:  I have reviewed the data as listed     Latest Ref Rng & Units 12/11/2022    3:38 PM 11/23/2022    4:28 AM 11/22/2022    6:06 AM  CBC  WBC 4.0 - 10.5 K/uL 4.4  2.8  3.4   Hemoglobin 12.0 - 15.0 g/dL 8.6  8.4  8.0   Hematocrit 36.0 - 46.0 %  28.6  27.8  26.4   Platelets 150 - 400 K/uL 232  267  231       Latest Ref Rng & Units 11/23/2022    4:28 AM 11/22/2022    6:06 AM 11/21/2022    3:04 AM  CMP  Glucose 70 - 99 mg/dL 161  096  045   BUN 8 - 23 mg/dL 16  18  18    Creatinine 0.44 - 1.00 mg/dL 4.09  8.11  9.14   Sodium 135 - 145 mmol/L 139  140  137   Potassium 3.5 - 5.1 mmol/L 3.8  3.1  3.4   Chloride 98 - 111 mmol/L 109  108  106   CO2 22 - 32 mmol/L 25  25  22    Calcium 8.9 - 10.3 mg/dL 8.7  8.6  8.3     Iron/TIBC/Ferritin/ %Sat    Component Value Date/Time   IRON 23 (L) 12/11/2022 1538   TIBC 351 12/11/2022 1538   FERRITIN 19 12/11/2022 1538   IRONPCTSAT 7 (L) 12/11/2022 1538      RADIOGRAPHIC STUDIES: I have personally reviewed the radiological images as listed and agreed with the findings in the report. IR NEPHROSTOMY EXCHANGE LEFT  Result Date: 11/21/2022 INDICATION: Chronic nephrostomy tube EXAM: Exchange of left nephrostomy tube using fluoroscopic guidance COMPARISON:  None MEDICATIONS: None ANESTHESIA/SEDATION: None CONTRAST:  8 mL Omnipaque 300-administered into the collecting system(s) FLUOROSCOPY TIME:  Fluoroscopy Time: 1.6 minutes (10 mGy) COMPLICATIONS: None immediate. PROCEDURE: Informed written consent was obtained from the patient after a thorough discussion of the procedural risks, benefits and alternatives. All questions were addressed. Maximal Sterile Barrier Technique was utilized including caps, mask, sterile gowns, sterile gloves, sterile drape, hand hygiene and skin antiseptic. A timeout was performed prior to the initiation  of the procedure. The patient was placed prone on the exam table. The left flank was prepped and draped in the standard sterile fashion with inclusion of the existing percutaneous nephrostomy tube within the sterile field. Initial injection of the nephrostomy tube was performed, demonstrating location within the renal pelvis. The nephrostomy tube was slightly retracted. The  locking loop was released, and over an Amplatz wire, the nephrostomy tube was exchanged for a new, similar 10.2 French locking nephrostomy tube. Locking loop was formed in the renal pelvis. Location was confirmed with injection of contrast material. It was attached to bag drainage, and secured to the skin using a clean dressing. The patient tolerated the procedure well without immediate complication. IMPRESSION: Successful exchange of left percutaneous nephrostomy tube for a new, similar 10 French nephrostomy tube. Patient to return to Interventional Radiology in 8 weeks for routine exchange. Electronically Signed   By: Olive Bass M.D.   On: 11/21/2022 15:40   ECHOCARDIOGRAM COMPLETE  Result Date: 11/21/2022    ECHOCARDIOGRAM REPORT   Patient Name:   Courtney Mack Date of Exam: 11/21/2022 Medical Rec #:  [id number]          Height:       59.0 in Accession #:    [id number]         Weight:       180.0 lb Date of Birth:  May 07, 1956          BSA:          1.764 m Patient Age:    66 years           BP:           122/41 mmHg Patient Gender: F                  HR:           96 bpm. Exam Location:  [facility] Procedure: 2D Echo, Cardiac Doppler and Color Doppler Indications:     Chet Pain  History:         Patient has prior history of Echocardiogram examinations, most                  recent 02/25/2022. CHF, COPD, Signs/Symptoms:Chest Pain, Dyspnea                  and Bacteremia; Risk Factors:Diabetes, Dyslipidemia,                  Hypertension and Sleep Apnea.  Sonographer:     Mikki Harbor Referring Phys:  [id number] Sondra Barges Diagnosing Phys: Julien Nordmann MD  Sonographer Comments: Technically difficult study due to poor echo windows. Image acquisition challenging due to COPD and Image acquisition challenging due to respiratory motion. IMPRESSIONS  1. Left ventricular ejection fraction, by estimation, is 60 to 65%. The left ventricle has normal function. The left ventricle has no regional wall motion  abnormalities. Left ventricular diastolic parameters are consistent with Grade I diastolic dysfunction (impaired relaxation).  2. Right ventricular systolic function is normal. The right ventricular size is normal. There is normal pulmonary artery systolic pressure. The estimated right ventricular systolic pressure is 17.6 mmHg.  3. The mitral valve is normal in structure. No evidence of mitral valve regurgitation. No evidence of mitral stenosis.  4. The aortic valve has an indeterminant number of cusps. Aortic valve regurgitation is not visualized. No aortic stenosis is present.  5. The inferior vena cava is normal in size with greater than  50% respiratory variability, suggesting right atrial pressure of 3 mmHg. FINDINGS  Left Ventricle: Left ventricular ejection fraction, by estimation, is 60 to 65%. The left ventricle has normal function. The left ventricle has no regional wall motion abnormalities. The left ventricular internal cavity size was normal in size. There is  no left ventricular hypertrophy. Left ventricular diastolic parameters are consistent with Grade I diastolic dysfunction (impaired relaxation). Right Ventricle: The right ventricular size is normal. No increase in right ventricular wall thickness. Right ventricular systolic function is normal. There is normal pulmonary artery systolic pressure. The tricuspid regurgitant velocity is 1.91 m/s, and  with an assumed right atrial pressure of 3 mmHg, the estimated right ventricular systolic pressure is 17.6 mmHg. Left Atrium: Left atrial size was normal in size. Right Atrium: Right atrial size was normal in size. Pericardium: There is no evidence of pericardial effusion. Mitral Valve: The mitral valve is normal in structure. No evidence of mitral valve regurgitation. No evidence of mitral valve stenosis. MV peak gradient, 8.6 mmHg. The mean mitral valve gradient is 3.0 mmHg. Tricuspid Valve: The tricuspid valve is normal in structure. Tricuspid valve  regurgitation is not demonstrated. No evidence of tricuspid stenosis. Aortic Valve: The aortic valve has an indeterminant number of cusps. Aortic valve regurgitation is not visualized. No aortic stenosis is present. Aortic valve mean gradient measures 6.0 mmHg. Aortic valve peak gradient measures 12.0 mmHg. Aortic valve area, by VTI measures 3.02 cm. Pulmonic Valve: The pulmonic valve was normal in structure. Pulmonic valve regurgitation is not visualized. No evidence of pulmonic stenosis. Aorta: The aortic root is normal in size and structure. Venous: The inferior vena cava is normal in size with greater than 50% respiratory variability, suggesting right atrial pressure of 3 mmHg. IAS/Shunts: No atrial level shunt detected by color flow Doppler.  LEFT VENTRICLE PLAX 2D LVIDd:         4.00 cm   Diastology LVIDs:         2.30 cm   LV e' medial:    7.29 cm/s LV PW:         1.20 cm   LV E/e' medial:  9.7 LV IVS:        1.10 cm   LV e' lateral:   8.70 cm/s LVOT diam:     1.90 cm   LV E/e' lateral: 8.1 LV SV:         93 LV SV Index:   53 LVOT Area:     2.84 cm  RIGHT VENTRICLE RV Basal diam:  3.20 cm RV Mid diam:    2.80 cm RV S prime:     16.60 cm/s LEFT ATRIUM             Index        RIGHT ATRIUM          Index LA diam:        3.80 cm 2.15 cm/m   RA Area:     9.54 cm LA Vol (A2C):   59.7 ml 33.85 ml/m  RA Volume:   15.70 ml 8.90 ml/m LA Vol (A4C):   36.6 ml 20.75 ml/m LA Biplane Vol: 49.2 ml 27.90 ml/m  AORTIC VALVE                     PULMONIC VALVE AV Area (Vmax):    2.59 cm      PV Vmax:       1.23 m/s AV Area (Vmean):   2.58  cm      PV Peak grad:  6.1 mmHg AV Area (VTI):     3.02 cm AV Vmax:           173.00 cm/s AV Vmean:          111.000 cm/s AV VTI:            0.308 m AV Peak Grad:      12.0 mmHg AV Mean Grad:      6.0 mmHg LVOT Vmax:         158.00 cm/s LVOT Vmean:        101.000 cm/s LVOT VTI:          0.328 m LVOT/AV VTI ratio: 1.06  AORTA Ao Root diam: 2.60 cm MITRAL VALVE                 TRICUSPID VALVE MV Area (PHT): 3.85 cm     TR Peak grad:   14.6 mmHg MV Area VTI:   3.32 cm     TR Vmax:        191.00 cm/s MV Peak grad:  8.6 mmHg MV Mean grad:  3.0 mmHg     SHUNTS MV Vmax:       1.47 m/s     Systemic VTI:  0.33 m MV Vmean:      81.0 cm/s    Systemic Diam: 1.90 cm MV Decel Time: 197 msec MV E velocity: 70.40 cm/s MV A velocity: 141.00 cm/s MV E/A ratio:  0.50 Julien Nordmann MD Electronically signed by Julien Nordmann MD Signature Date/Time: 11/21/2022/12:21:09 PM    Final    CT ABDOMEN PELVIS W CONTRAST  Result Date: 11/21/2022 CLINICAL DATA:  Abdominal pain, acute, nonlocalized EXAM: CT ABDOMEN AND PELVIS WITH CONTRAST TECHNIQUE: Multidetector CT imaging of the abdomen and pelvis was performed using the standard protocol following bolus administration of intravenous contrast. RADIATION DOSE REDUCTION: This exam was performed according to the departmental dose-optimization program which includes automated exposure control, adjustment of the mA and/or kV according to patient size and/or use of iterative reconstruction technique. CONTRAST:  75mL OMNIPAQUE IOHEXOL 350 MG/ML SOLN COMPARISON:  CT angio chest 11/20/2022, CT abdomen pelvis 03/26/2022 FINDINGS: Lower chest: Please see separately dictated CT angio chest 11/20/2022. Hepatobiliary: The liver is mildly enlarged measuring up to 18 cm. No focal liver abnormality. No gallstones, gallbladder wall thickening, or pericholecystic fluid. No biliary dilatation. Pancreas: Diffusely atrophic. No focal lesion. Otherwise normal pancreatic contour. No surrounding inflammatory changes. No main pancreatic ductal dilatation. Spleen: Normal in size without focal abnormality. Adrenals/Urinary Tract: No adrenal nodule bilaterally. Left percutaneous nephrostomy tube with pigtail terminating within the renal pelvis. Bilateral kidneys enhance symmetrically. No hydronephrosis. No hydroureter. 2 mm right nephrolithiasis. No left nephrolithiasis. Mild urothelial  thickening of bilateral collecting system with slightly hazy urinary bladder wall contour and mild perivesicular fat stranding. The urinary bladder is unremarkable. On delayed imaging, there is no urothelial wall thickening and there are no filling defects in the opacified portions of the bilateral collecting systems or ureters. Stomach/Bowel: Stomach is within normal limits. No evidence of bowel wall thickening or dilatation. Under distended colon. Appendix appears normal. Vascular/Lymphatic: Inferior vena cava filter in appropriate position. No abdominal aorta or iliac aneurysm. Moderate atherosclerotic plaque of the aorta and its branches. No abdominal, pelvic, or inguinal lymphadenopathy. Reproductive: Uterus and bilateral adnexa are unremarkable. Other: No intraperitoneal free fluid. No intraperitoneal free gas. No organized fluid collection. Musculoskeletal: No abdominal wall hernia or abnormality. Diffuse subcutaneus  soft tissue edema. No suspicious lytic or blastic osseous lesions. No acute displaced fracture. Severe degenerative changes of the bilateral hips. IMPRESSION: 1. Findings suggestive of bilateral collecting system and urinary bladder infection. Correlate with urinalysis. 2. Left nephrostomy tube in appropriate position. 3. Nonobstructive 2 mm right nephrolithiasis. 4. Mild hepatomegaly. 5. Severe bilateral degenerative changes of the hips. 6.  Aortic Atherosclerosis (ICD10-I70.0). Electronically Signed   By: Tish Frederickson M.D.   On: 11/21/2022 00:35   CT Angio Chest PE W and/or Wo Contrast  Result Date: 11/21/2022 CLINICAL DATA:  Pulmonary embolism (PE) suspected, high prob. Shortness of breath EXAM: CT ANGIOGRAPHY CHEST WITH CONTRAST TECHNIQUE: Multidetector CT imaging of the chest was performed using the standard protocol during bolus administration of intravenous contrast. Multiplanar CT image reconstructions and MIPs were obtained to evaluate the vascular anatomy. RADIATION DOSE  REDUCTION: This exam was performed according to the departmental dose-optimization program which includes automated exposure control, adjustment of the mA and/or kV according to patient size and/or use of iterative reconstruction technique. CONTRAST:  75mL OMNIPAQUE IOHEXOL 350 MG/ML SOLN COMPARISON:  10/24/2022 FINDINGS: Cardiovascular: No filling defects in the pulmonary arteries to suggest pulmonary emboli. Heart is normal size. Aorta is normal caliber. Scattered aortic calcifications. Mediastinum/Nodes: No mediastinal, hilar, or axillary adenopathy. Trachea and esophagus are unremarkable. Thyroid unremarkable. Lungs/Pleura: No confluent airspace opacities or effusions. Upper Abdomen: No acute findings in the upper abdomen. Musculoskeletal: Chest wall soft tissues are unremarkable. No acute bony abnormality. Review of the MIP images confirms the above findings. IMPRESSION: No evidence of pulmonary embolus. No acute cardiopulmonary disease. Aortic Atherosclerosis (ICD10-I70.0). Electronically Signed   By: Charlett Nose M.D.   On: 11/21/2022 00:07   DG Chest 2 View  Result Date: 11/20/2022 CLINICAL DATA:  Shortness of breath EXAM: CHEST - 2 VIEW COMPARISON:  Chest radiograph dated 10/24/2022 FINDINGS: Normal lung volumes. No focal consolidations. No pleural effusion or pneumothorax. The heart size and mediastinal contours are within normal limits. No acute osseous abnormality. Partially imaged IVC filter. IMPRESSION: No active cardiopulmonary disease. Electronically Signed   By: Agustin Cree M.D.   On: 11/20/2022 18:57

## 2022-12-16 NOTE — Assessment & Plan Note (Signed)
Stage II endometrial cancer with cervical involvement or cervical cancer.  Not a candidate of surgery and chemotherapy.  Status post radiation and brachytherapy. She is overdue for follow up with gynecology oncology Recent CT abdomen pelvis showed no evidence of recurrence.

## 2022-12-16 NOTE — Assessment & Plan Note (Signed)
S/P IVC, Not able to tolerate anticoagulation due to high bleeding risk.

## 2022-12-24 ENCOUNTER — Emergency Department: Payer: Medicare HMO

## 2022-12-24 ENCOUNTER — Emergency Department
Admission: EM | Admit: 2022-12-24 | Discharge: 2022-12-24 | Disposition: A | Discharge status: Home / Self Care | Payer: Medicare HMO | Attending: Emergency Medicine | Admitting: Emergency Medicine

## 2022-12-24 ENCOUNTER — Other Ambulatory Visit: Payer: Self-pay

## 2022-12-24 ENCOUNTER — Encounter: Payer: Self-pay | Admitting: *Deleted

## 2022-12-24 DIAGNOSIS — I5032 Chronic diastolic (congestive) heart failure: Secondary | ICD-10-CM | POA: Insufficient documentation

## 2022-12-24 DIAGNOSIS — R42 Dizziness and giddiness: Secondary | ICD-10-CM | POA: Diagnosis not present

## 2022-12-24 DIAGNOSIS — D509 Iron deficiency anemia, unspecified: Secondary | ICD-10-CM | POA: Diagnosis not present

## 2022-12-24 DIAGNOSIS — I110 Hypertensive heart disease with heart failure: Secondary | ICD-10-CM | POA: Diagnosis not present

## 2022-12-24 DIAGNOSIS — Z8542 Personal history of malignant neoplasm of other parts of uterus: Secondary | ICD-10-CM | POA: Diagnosis not present

## 2022-12-24 DIAGNOSIS — R0602 Shortness of breath: Secondary | ICD-10-CM | POA: Insufficient documentation

## 2022-12-24 DIAGNOSIS — Z8616 Personal history of COVID-19: Secondary | ICD-10-CM | POA: Diagnosis not present

## 2022-12-24 DIAGNOSIS — J441 Chronic obstructive pulmonary disease with (acute) exacerbation: Secondary | ICD-10-CM | POA: Insufficient documentation

## 2022-12-24 DIAGNOSIS — E119 Type 2 diabetes mellitus without complications: Secondary | ICD-10-CM | POA: Diagnosis not present

## 2022-12-24 LAB — URINALYSIS, ROUTINE W REFLEX MICROSCOPIC
Bilirubin Urine: NEGATIVE
Glucose, UA: NEGATIVE mg/dL
Hgb urine dipstick: NEGATIVE
Ketones, ur: NEGATIVE mg/dL
Leukocytes,Ua: NEGATIVE
Nitrite: NEGATIVE
Protein, ur: NEGATIVE mg/dL
Specific Gravity, Urine: 1.019 (ref 1.005–1.030)
pH: 6 (ref 5.0–8.0)

## 2022-12-24 LAB — CBC
HCT: 30.3 % — ABNORMAL LOW (ref 36.0–46.0)
Hemoglobin: 8.9 g/dL — ABNORMAL LOW (ref 12.0–15.0)
MCH: 25.2 pg — ABNORMAL LOW (ref 26.0–34.0)
MCHC: 29.4 g/dL — ABNORMAL LOW (ref 30.0–36.0)
MCV: 85.8 fL (ref 80.0–100.0)
Platelets: 247 10*3/uL (ref 150–400)
RBC: 3.53 MIL/uL — ABNORMAL LOW (ref 3.87–5.11)
RDW: 20.5 % — ABNORMAL HIGH (ref 11.5–15.5)
WBC: 3.8 10*3/uL — ABNORMAL LOW (ref 4.0–10.5)
nRBC: 0 % (ref 0.0–0.2)

## 2022-12-24 LAB — BASIC METABOLIC PANEL
Anion gap: 12 (ref 5–15)
BUN: 20 mg/dL (ref 8–23)
CO2: 24 mmol/L (ref 22–32)
Calcium: 9.3 mg/dL (ref 8.9–10.3)
Chloride: 105 mmol/L (ref 98–111)
Creatinine, Ser: 0.97 mg/dL (ref 0.44–1.00)
GFR, Estimated: >60 mL/min (ref >60)
Glucose, Bld: 107 mg/dL — ABNORMAL HIGH (ref 70–99)
Potassium: 3.4 mmol/L — ABNORMAL LOW (ref 3.5–5.1)
Sodium: 140 mmol/L (ref 135–145)

## 2022-12-24 LAB — LACTIC ACID, PLASMA: Lactic Acid, Venous: 1.8 mmol/L (ref 0.5–1.9)

## 2022-12-24 LAB — TROPONIN I (HIGH SENSITIVITY)
Troponin I (High Sensitivity): 6 ng/L (ref <18)
Troponin I (High Sensitivity): 7 ng/L (ref <18)

## 2022-12-24 MED ORDER — LACTATED RINGERS IV BOLUS
1000.0000 mL | Freq: Once | INTRAVENOUS | Status: AC
  Administered 2022-12-24: 1000 mL via INTRAVENOUS

## 2022-12-24 MED ORDER — LACTATED RINGERS IV BOLUS
500.0000 mL | Freq: Once | INTRAVENOUS | Status: AC
  Administered 2022-12-24: 500 mL via INTRAVENOUS

## 2022-12-24 MED FILL — Iron Sucrose Inj 20 MG/ML (Fe Equiv): INTRAVENOUS | Qty: 10 | Status: AC

## 2022-12-24 NOTE — ED Provider Notes (Signed)
[facility] Provider Note    Event Date/Time   First MD Initiated Contact with Patient 12/24/22 1659     (approximate)   History   Shortness of Breath   HPI  Courtney Mack is a 67 y.o. female past medical history of diastolic heart failure, obesity, VTE not anticoagulated, diabetes, hypertension, lipidemia who presents with chest pain and shortness of breath and funny feeling in her head.  Patient tells me that since today she has had more dyspnea on exertion.  She has intermittent chest pain described as sharp in the center of her chest that is similar to chest pain she had during last admission.  It is coming going every 2-3 hours not clearly exertional.  She denies any cough fevers or chills.  She does have some burning in her lower abdomen with urination which has been new over the last several days.  Does have history of recent UTI during admission.  Denies lower extremity swelling.  Has been eating and drinking no vomiting or diarrhea.     Past Medical History:  Diagnosis Date   Acute bilateral deep vein thrombosis (DVT) of femoral veins (HCC) 01/02/2021   Acute massive pulmonary embolism (HCC) 08/25/2020   Last Assessment & Plan:  Formatting of this note might be different from the original. Post vena caval filter and on xarelto and O2   Anemia    ARF (acute respiratory failure) (HCC)    Cellulitis of left lower leg 06/20/2021   CHF (congestive heart failure) (HCC)    COVID-19    Diabetes mellitus without complication (HCC)    DVT (deep venous thrombosis) (HCC) 09/06/2020   Endometrial cancer (HCC)    GAVE (gastric antral vascular ectasia)    GERD (gastroesophageal reflux disease)    High cholesterol    Hx of blood clots    Hyperlipidemia    Hypertension    IDA (iron deficiency anemia) 09/21/2020   Obesity    Pneumonia 06/11/2022   recovered   Pressure injury of skin 06/21/2021   Pulmonary embolism (HCC)    Sepsis (HCC)     Thrombocytopenia (HCC)    Urinary tract infection 09/13/2021    Patient Active Problem List   Diagnosis Date Noted   Obesity (BMI 30-39.9) 11/22/2022   Sepsis due to gram-negative UTI (HCC) 11/21/2022   Acute pyelonephritis 11/21/2022   Dyspnea 11/21/2022   Chest pain 11/21/2022   Hypokalemia 10/25/2022   COPD exacerbation (HCC) 10/24/2022   Iron deficiency anemia 10/24/2022   Symptomatic anemia 08/08/2022   COPD (chronic obstructive pulmonary disease) (HCC) 08/07/2022   Sepsis due to pneumonia (HCC) 06/12/2022   Multifocal pneumonia 06/12/2022   GERD without esophagitis 06/12/2022   Generalized weakness 06/12/2022   Nephrostomy status (HCC) 06/12/2022   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) 04/16/2022   GI bleed 03/14/2022   Acute on chronic anemia 02/24/2022   Hypotension 02/24/2022   Acute cystitis with hematuria    Lymphedema 02/08/2022   Hematuria 12/24/2021   History of pulmonary embolus (PE) 12/24/2021   Chronic radiation cystitis 11/17/2021   Hydronephrosis    Gastric antral vascular ectasia    UTI (urinary tract infection) 09/13/2021   Melena 07/18/2021   Acute on chronic blood loss anemia 07/18/2021   HLD (hyperlipidemia) 06/20/2021   Chronic diastolic CHF (congestive heart failure) (HCC) 06/20/2021   Risk factors for obstructive sleep apnea 11/10/2020   IDA (iron deficiency anemia) 09/21/2020   Endometrial cancer (HCC) 09/13/2020  Recurrent deep vein thrombosis (DVT) (HCC) 09/06/2020   Morbidly obese (HCC) 08/23/2020   Dyslipidemia 08/23/2020     Physical Exam  Triage Vital Signs: ED Triage Vitals  Enc Vitals Group     BP 12/24/22 1556 (!) 109/57     Pulse Rate 12/24/22 1556 90     Resp 12/24/22 1556 (!) 22     Temp 12/24/22 1559 97.7 F (36.5 C)     Temp Source 12/24/22 1556 Oral     SpO2 12/24/22 1556 100 %     Weight 12/24/22 1556 185 lb (83.9 kg)     Height 12/24/22 1556 4\' 11"  (1.499 m)     Head Circumference --       Peak Flow --      Pain Score 12/24/22 1556 6     Pain Loc --      Pain Edu? --      Excl. in GC? --     Most recent vital signs: Vitals:   12/24/22 2000 12/24/22 2030  BP: (!) 113/47 (!) 123/53  Pulse: 79 70  Resp: 18 19  Temp:    SpO2: 100% 100%     General: Awake, no distress.  CV:  Good peripheral perfusion.  Chronic lymphedema but no pitting edema Resp:  Normal effort.  Lung sounds are clear Abd:  No distention.  Left-sided nephrostomy tube in place, surrounding erythema, abdomen soft nontender Neuro:             Awake, Alert, Oriented x 3  Other:     ED Results / Procedures / Treatments  Labs (all labs ordered are listed, but only abnormal results are displayed) Labs Reviewed  BASIC METABOLIC PANEL - Abnormal; Notable for the following components:      Result Value   Potassium 3.4 (*)    Glucose, Bld 107 (*)    All other components within normal limits  CBC - Abnormal; Notable for the following components:   WBC 3.8 (*)    RBC 3.53 (*)    Hemoglobin 8.9 (*)    HCT 30.3 (*)    MCH 25.2 (*)    MCHC 29.4 (*)    RDW 20.5 (*)    All other components within normal limits  URINALYSIS, ROUTINE W REFLEX MICROSCOPIC - Abnormal; Notable for the following components:   Color, Urine YELLOW (*)    APPearance HAZY (*)    All other components within normal limits  LACTIC ACID, PLASMA  TROPONIN I (HIGH SENSITIVITY)  TROPONIN I (HIGH SENSITIVITY)     EKG  EKG interpretation performed by myself: NSR, nml axis, nml intervals, no acute ischemic changes    RADIOLOGY I reviewed and interpreted the CXR which does not show any acute cardiopulmonary process    PROCEDURES:  Critical Care performed: Yes, see critical care procedure note(s)  Procedures  The patient is on the cardiac monitor to evaluate for evidence of arrhythmia and/or significant heart rate changes.   MEDICATIONS ORDERED IN ED: Medications  lactated ringers bolus 1,000 mL (0 mLs Intravenous  Stopped 12/24/22 1926)  lactated ringers bolus 500 mL (0 mLs Intravenous Stopped 12/24/22 2100)     IMPRESSION / MDM / ASSESSMENT AND PLAN / ED COURSE  I reviewed the triage vital signs and the nursing notes.                              Patient's presentation is most consistent with  acute presentation with potential threat to life or bodily function.  Differential diagnosis includes, but is not limited to, pneumonia, pleural effusion, PE, sepsis, UTI, urosepsis, pyelonephritis  The patient is a 67 year old female who presents today primarily with dyspnea and chest pressure.  This started today she describes dyspnea on exertion as well as some intermittent sharp chest pain which feels similar to prior episodes of chest pain she has had a past.  She is denying cough fevers or chills.  Does endorse some lower urinary tract symptoms including burning with urination and some suprapubic pain which has been going on for the last several days as well.  Patient was recently admitted for anemia and was treated for UTI during admission.  His initial blood pressure was somewhat soft and when I am in the room and is in the 80s systolic but she is not symptomatic.  She does have a left-sided nephrostomy tube in place, abdominal exam is benign, lung sounds are clear, she has chronic lymphedema but no pitting edema.  I reviewed patient's chest x-ray which looks clear.  EKG is nonischemic.  Labs are notable for stable hemoglobin she is mildly leukopenic.  She is mildly hypokalemic.  Urinalysis has no leukocytes negative nitrates this is not suggestive of UTI.  I did obtain a lactate given her hypotension and this is normal.  Troponin is also negative.  Patient did recently just have a CT angio of her chest about a month ago when she presented with dyspnea and this was negative.  Given she is not hypoxic and not tachycardic with no elevation of troponin and no signs of heart strain EKG I feel that the utility of  obtaining an additional CTA today is low.  On repeat assessment patient looks comfortable saturating 100% on room air.  Blood pressures are stable and near her baseline.  She looks well and has no acute complaints at this time.  I do think that she is safe to be discharged at this time.  Discussed return precautions.       FINAL CLINICAL IMPRESSION(S) / ED DIAGNOSES   Final diagnoses:  Shortness of breath  Lightheadedness     Rx / DC Orders   ED Discharge Orders     None        Note:  This document was prepared using Dragon voice recognition software and may include unintentional dictation errors.   Georga Hacking, MD 12/24/22 2251

## 2022-12-24 NOTE — ED Triage Notes (Signed)
Pt to er via ems, per ems pt is here for chest pain, states that she has a hx of chf, states that they gave 324 aspirin, states that he also has a hx of low hemoglobin.  Ems states that they had three unsuccessful iv attempts.

## 2022-12-24 NOTE — ED Triage Notes (Signed)
Pt brought in via ems from home with sob and chest pain  sx began this am.  No n/v/d.  No diaphoresis.  Nonsmoker.  No cough.  Hx copd.  Pt alert   speech clear.

## 2022-12-24 NOTE — Discharge Instructions (Addendum)
Your blood pressure was on the low side here and you received IV fluids.  Please make sure you are drinking enough and standing up slowly.  Please follow-up for your iron infusion tomorrow.  If your shortness of breath or chest pain is worsening then please return to the emergency department.

## 2022-12-25 ENCOUNTER — Inpatient Hospital Stay: Payer: Medicare HMO

## 2022-12-26 ENCOUNTER — Inpatient Hospital Stay: Payer: Medicare HMO

## 2022-12-26 VITALS — BP 117/45 | HR 79 | Resp 18

## 2022-12-26 DIAGNOSIS — D500 Iron deficiency anemia secondary to blood loss (chronic): Secondary | ICD-10-CM

## 2022-12-26 DIAGNOSIS — D509 Iron deficiency anemia, unspecified: Secondary | ICD-10-CM | POA: Diagnosis not present

## 2022-12-26 MED ORDER — SODIUM CHLORIDE 0.9 % IV SOLN
INTRAVENOUS | Status: DC
  Filled 2022-12-26: qty 250

## 2022-12-26 MED ORDER — SODIUM CHLORIDE 0.9 % IV SOLN
200.0000 mg | Freq: Once | INTRAVENOUS | Status: AC
  Administered 2022-12-26: 200 mg via INTRAVENOUS
  Filled 2022-12-26: qty 200

## 2022-12-26 NOTE — Patient Instructions (Signed)

## 2022-12-31 MED FILL — Iron Sucrose Inj 20 MG/ML (Fe Equiv): INTRAVENOUS | Qty: 10 | Status: AC

## 2023-01-01 ENCOUNTER — Inpatient Hospital Stay: Payer: Medicare HMO

## 2023-01-01 ENCOUNTER — Telehealth: Payer: Self-pay | Admitting: Oncology

## 2023-01-01 NOTE — Telephone Encounter (Signed)
Ok to r/s iron infusion.   Communicated with Silva Bandy to see if pt needs follow up with GYN, she will contact pt and let us know how to proceed with scheduling.

## 2023-01-01 NOTE — Telephone Encounter (Signed)
Call returned from gyn oncology. Last seen in 2022 for endometrial cancer surveillance. She cancelled future appointments and never rescheduled. We have arranged an appointment with Dr. Johnnette Litter to resume pelvic exams and surveillance.

## 2023-01-01 NOTE — Telephone Encounter (Signed)
Pt called to r/s infusion appt and also wanted to get scheduled for a pap smear she said it has been 2-3 years  Please advise on scheduling this, thank you

## 2023-01-03 ENCOUNTER — Inpatient Hospital Stay: Payer: Medicare HMO

## 2023-01-03 VITALS — BP 104/41 | HR 79 | Temp 97.7°F | Resp 18

## 2023-01-03 DIAGNOSIS — D500 Iron deficiency anemia secondary to blood loss (chronic): Secondary | ICD-10-CM

## 2023-01-03 DIAGNOSIS — D509 Iron deficiency anemia, unspecified: Secondary | ICD-10-CM | POA: Diagnosis not present

## 2023-01-03 MED ORDER — SODIUM CHLORIDE 0.9 % IV SOLN
200.0000 mg | Freq: Once | INTRAVENOUS | Status: AC
  Administered 2023-01-03: 200 mg via INTRAVENOUS
  Filled 2023-01-03: qty 200

## 2023-01-03 MED ORDER — SODIUM CHLORIDE 0.9 % IV SOLN
INTRAVENOUS | Status: DC
  Filled 2023-01-03: qty 250

## 2023-01-03 NOTE — Progress Notes (Signed)
Patient declined to wait the 30 minutes for post iron infusion observation today. Tolerated infusion well. VSS. 

## 2023-01-10 ENCOUNTER — Other Ambulatory Visit: Payer: Self-pay | Admitting: Family

## 2023-01-14 ENCOUNTER — Encounter: Payer: Medicare HMO | Admitting: Family

## 2023-01-14 NOTE — Progress Notes (Deleted)
PCP: Primary Cardiologist:  HPI:  Primary cardiologist: Julien Nordmann, MD (last seen 03/23) PCP: [facility] (returns next week)  HPI  Ms Eisenhauer is a 67 y/o female with a history of DM, hyperlipidemia, HTN, COPD, DVT, PE, anemia, thrombocytopenia, tobacco use and chronic heart failure.    Echo 02/25/22: EF of 65-70%. Echo 09/12/21: EF of 55-60% along with mild LVH/ LAE & mild MR.    Admitted 10/24/22 due to SOB d/t COPD exacerbation. Treated with steroids and antibiotics.                         She presents today for a HF follow-up visit with a chief complaint of minimal SOB with moderate exertion. Chronic in nature and "much better" since her recent admission. Has fatigue, very little pedal edema, easy bruising and intermittent back pain along with this. Denies difficulty sleeping, abdominal distention, palpitations, chest pain, cough, dizziness or weight gain.   No longer wearing compression boots because she doesn't have anymore swelling in her legs. Not adding salt and her daughter has been reading food labels and doesn't cook with salt. Is drinking 3-4 water bottles (16.9oz) and sometimes 5 bottles total along with 36 oz soda and ~ 16 oz of milk daily.  TOTAL 102.7- 136.5oz     ROS: All systems negative except as listed in HPI, PMH and Problem List.  SH:  Social History   Socioeconomic History   Marital status: Single    Spouse name: Not on file   Number of children: Not on file   Years of education: Not on file   Highest education level: Not on file  Occupational History   Not on file  Tobacco Use   Smoking status: Never   Smokeless tobacco: Never   Tobacco comments:    smoked 2-4 cigarettes for about 2-3 weeks   Vaping Use   Vaping Use: Never used  Substance and Sexual Activity   Alcohol use: Not Currently   Drug use: Never   Sexual activity: Not Currently  Other Topics Concern   Not on file  Social History Narrative   Not on file    Social Determinants of Health   Financial Resource Strain: Not on file  Food Insecurity: No Food Insecurity (11/21/2022)   Hunger Vital Sign    Worried About Running Out of Food in the Last Year: Never true    Ran Out of Food in the Last Year: Never true  Transportation Needs: No Transportation Needs (11/21/2022)   PRAPARE - Administrator, Civil Service (Medical): No    Lack of Transportation (Non-Medical): No  Physical Activity: Not on file  Stress: Not on file  Social Connections: Not on file  Intimate Partner Violence: Not At Risk (11/21/2022)   Humiliation, Afraid, Rape, and Kick questionnaire    Fear of Current or Ex-Partner: No    Emotionally Abused: No    Physically Abused: No    Sexually Abused: No    FH:  Family History  Problem Relation Age of Onset   Hypertension Mother    Diabetes Mother    Diabetes Father    Hypertension Father    High Cholesterol Father    Congestive Heart Failure Father    Breast cancer Cousin     Past Medical History:  Diagnosis Date   Acute bilateral deep vein thrombosis (DVT) of femoral veins (HCC) 01/02/2021   Acute massive pulmonary embolism (HCC) 08/25/2020  Last Assessment & Plan:  Formatting of this note might be different from the original. Post vena caval filter and on xarelto and O2   Anemia    ARF (acute respiratory failure) (HCC)    Cellulitis of left lower leg 06/20/2021   CHF (congestive heart failure) (HCC)    COVID-19    Diabetes mellitus without complication (HCC)    DVT (deep venous thrombosis) (HCC) 09/06/2020   Endometrial cancer (HCC)    GAVE (gastric antral vascular ectasia)    GERD (gastroesophageal reflux disease)    High cholesterol    Hx of blood clots    Hyperlipidemia    Hypertension    IDA (iron deficiency anemia) 09/21/2020   Obesity    Pneumonia 06/11/2022   recovered   Pressure injury of skin 06/21/2021   Pulmonary embolism (HCC)    Sepsis (HCC)    Thrombocytopenia (HCC)     Urinary tract infection 09/13/2021    Current Outpatient Medications  Medication Sig Dispense Refill   acetaminophen (TYLENOL) 500 MG tablet Take 500 mg by mouth every 6 (six) hours as needed for mild pain or fever.     albuterol (VENTOLIN HFA) 108 (90 Base) MCG/ACT inhaler Inhale 2 puffs into the lungs every 6 (six) hours as needed for wheezing or shortness of breath. 8 g 0   Ascorbic Acid (VITAMIN C) 1000 MG tablet Take 1,000 mg by mouth daily.     FEROSUL 325 (65 Fe) MG tablet Take 325 mg by mouth daily with breakfast.     fluticasone furoate-vilanterol (BREO ELLIPTA) 100-25 MCG/ACT AEPB Inhale 1 puff into the lungs daily.     furosemide (LASIX) 20 MG tablet TAKE 1 TABLET BY MOUTH DAILY 30 tablet 3   loperamide (IMODIUM) 2 MG capsule Take 2 mg by mouth as needed for diarrhea or loose stools.     Multiple Vitamin (MULTIVITAMIN WITH MINERALS) TABS tablet Take 1 tablet by mouth daily.     pantoprazole (PROTONIX) 40 MG tablet Take 1 tablet (40 mg total) by mouth 2 (two) times daily. 60 tablet 1   rosuvastatin (CRESTOR) 10 MG tablet Take 1 tablet (10 mg total) by mouth daily. 15 tablet 0   No current facility-administered medications for this visit.      PHYSICAL EXAM:  General:  Well appearing. No resp difficulty HEENT: normal Neck: supple. JVP flat. Carotids 2+ bilaterally; no bruits. No lymphadenopathy or thryomegaly appreciated. Cor: PMI normal. Regular rate & rhythm. No rubs, gallops or murmurs. Lungs: clear Abdomen: soft, nontender, nondistended. No hepatosplenomegaly. No bruits or masses. Good bowel sounds. Extremities: no cyanosis, clubbing, rash, edema Neuro: alert & orientedx3, cranial nerves grossly intact. Moves all 4 extremities w/o difficulty. Affect pleasant.   ECG:   ASSESSMENT & PLAN:  1: NICM with preserved ejection fraction- - NYHA class II - euvolemic today - weighing daily; reminded to call for an overnight weight gain of > 2 pounds or a weekly weight gain  of > 5 pounds - weight up 4 pounds from last visit here 3 months ago - etiology likely HTN/ COPD - Echo 02/25/22: EF of 65-70%. Echo 09/12/21: EF of 55-60% along with mild LVH/ LAE & mild MR.  - not adding salt except on rare occasions;  - saw cardiology Mariah Milling) 03/23 - continue furosemide 20mg  daily - recurrent UTI's so not a candidate for SGLT2 - reviewed the importance of keeping her total daily fluid intake to 60-64oz and that, currently, she's drinking >100 oz daily - BNP  10/24/22 was 54.1 - PharmD reconciled meds w/ patient   2: HTN- - BP 135/67 - follows with PCP at [facility] and returns next week - BMP 10/26/22 reviewed and showed sodium 138, potassium 4.2, creatinine 0.82 and GFR >60   3: DM- - A1c 11/16/21 was 5.7%   4: Anemia- - hemoglobin 10/26/22 was 7.6 - saw hematology Cathie Hoops) 10/23 - iron infusion done 10/08/22  5: Lymphedema- - resolved - no longer using compression boots but plans to go get measured for compression socks  6: Recurrent UTI- - saw urology Lonna Cobb) 10/23 - underwent nephrogram and nephrostomy tube exchange in the past - not a candidate for SGLT2   Return in 2 months, sooner if needed.

## 2023-01-15 ENCOUNTER — Ambulatory Visit: Admission: RE | Admit: 2023-01-15 | Payer: Medicare HMO | Source: Ambulatory Visit | Admitting: Radiology

## 2023-01-15 MED FILL — Iron Sucrose Inj 20 MG/ML (Fe Equiv): INTRAVENOUS | Qty: 10 | Status: AC

## 2023-01-16 ENCOUNTER — Inpatient Hospital Stay: Payer: Medicare HMO | Attending: Oncology

## 2023-01-17 NOTE — Progress Notes (Signed)
Patient for IR LT Neph Tube Exchange on Monday 01/20/2023, I called and spoke with the patient on the phone and gave pre-procedure instructions. Pt was made aware to be here at 2:30p. Pt stated understanding.  Called 01/15/2023

## 2023-01-20 ENCOUNTER — Ambulatory Visit
Admission: RE | Admit: 2023-01-20 | Discharge: 2023-01-20 | Disposition: A | Discharge status: Home / Self Care | Payer: Medicare HMO | Source: Ambulatory Visit | Attending: Interventional Radiology | Admitting: Interventional Radiology

## 2023-01-21 ENCOUNTER — Ambulatory Visit: Payer: Medicare HMO | Attending: Family | Admitting: Family

## 2023-01-21 ENCOUNTER — Encounter: Payer: Self-pay | Admitting: Family

## 2023-01-21 VITALS — BP 96/48 | HR 80 | Wt 190.0 lb

## 2023-01-21 DIAGNOSIS — I82402 Acute embolism and thrombosis of unspecified deep veins of left lower extremity: Secondary | ICD-10-CM | POA: Diagnosis not present

## 2023-01-21 DIAGNOSIS — I509 Heart failure, unspecified: Secondary | ICD-10-CM | POA: Insufficient documentation

## 2023-01-21 DIAGNOSIS — I428 Other cardiomyopathies: Secondary | ICD-10-CM | POA: Diagnosis not present

## 2023-01-21 DIAGNOSIS — Z86718 Personal history of other venous thrombosis and embolism: Secondary | ICD-10-CM | POA: Diagnosis not present

## 2023-01-21 DIAGNOSIS — Z8542 Personal history of malignant neoplasm of other parts of uterus: Secondary | ICD-10-CM | POA: Insufficient documentation

## 2023-01-21 DIAGNOSIS — E119 Type 2 diabetes mellitus without complications: Secondary | ICD-10-CM | POA: Insufficient documentation

## 2023-01-21 DIAGNOSIS — I5032 Chronic diastolic (congestive) heart failure: Secondary | ICD-10-CM

## 2023-01-21 DIAGNOSIS — Z833 Family history of diabetes mellitus: Secondary | ICD-10-CM | POA: Insufficient documentation

## 2023-01-21 DIAGNOSIS — N390 Urinary tract infection, site not specified: Secondary | ICD-10-CM

## 2023-01-21 DIAGNOSIS — Z86711 Personal history of pulmonary embolism: Secondary | ICD-10-CM | POA: Insufficient documentation

## 2023-01-21 DIAGNOSIS — K219 Gastro-esophageal reflux disease without esophagitis: Secondary | ICD-10-CM | POA: Diagnosis not present

## 2023-01-21 DIAGNOSIS — I110 Hypertensive heart disease with heart failure: Secondary | ICD-10-CM | POA: Diagnosis not present

## 2023-01-21 DIAGNOSIS — D649 Anemia, unspecified: Secondary | ICD-10-CM | POA: Insufficient documentation

## 2023-01-21 DIAGNOSIS — I10 Essential (primary) hypertension: Secondary | ICD-10-CM

## 2023-01-21 DIAGNOSIS — Z87440 Personal history of urinary (tract) infections: Secondary | ICD-10-CM | POA: Insufficient documentation

## 2023-01-21 MED ORDER — FUROSEMIDE 20 MG PO TABS: 20.0000 mg | ORAL_TABLET | Freq: Every day | ORAL | 3 refills | Status: DC

## 2023-01-21 MED ORDER — ROSUVASTATIN CALCIUM 10 MG PO TABS: 10.0000 mg | ORAL_TABLET | Freq: Every day | ORAL | 5 refills | Status: DC

## 2023-01-21 NOTE — Patient Instructions (Signed)
Call cardiology at [phone] to get hospital follow-up appointment scheduled.

## 2023-01-21 NOTE — Progress Notes (Signed)
PCP: [facility]  Primary Cardiologist: Courtney Nordmann, MD (last seen 03/23)  HPI:  Ms Courtney Mack is a 67 y/o female with a history of DM, endometrial cancer, GERD, hyperlipidemia, HTN, COPD, recurrent DVT, extensive bilateral PE s/p embolectomy and IVC placement, lymphedema, ureteral stricture, anemia, thrombocytopenia, tobacco use and chronic heart failure.   Admitted 10/24/22 due to SOB d/t COPD exacerbation. Treated with steroids and antibiotics. Admitted 11/20/22 due to acute shortness of breath. Admitted on fluids, antibiotics for sepsis. Cardiology consulted for chest pain.  Transfused 1 unit PRBC's and received 2 doses of IV iron. Gram-negative UTI treated with 3 days of intravenous antibiotic. Received 3 days total of IV antibiotics. Percutaneous nephrostomy tube was exchanged due to acute pyelonephritis. Was in the ED 12/24/22 due to chest pain and shortness of breath and funny feeling in her head. Symptoms improved and she was released.   Echo 09/12/21: EF of 55-60% along with mild LVH/ LAE & mild MR. Echo 02/25/22: EF of 65-70% Echo 11/21/22: EF 60-65% along with Grade I DD and normal PA pressure of 17.6 mmHg.    She presents today for a HF follow-up visit with a chief complaint of minimal SOB with moderate exertion. Chronic in nature but worse with hot/ humid weather, Has associated fatigue, pedal edema and gradual weight gain along with this. Denies chest pain, cough, palpitations, dizziness, abdominal distention or difficulty sleeping.  Accidentally threw away the bag that had her crestor/ furosemide in it so wants to see if I can send in new RX's for both of these.   Weight at home stays around 190  ROS: All systems negative except as listed in HPI, PMH and Problem List.  SH:  Social History   Socioeconomic History   Marital status: Single    Spouse name: Not on file   Number of children: Not on file   Years of education: Not on file   Highest education  level: Not on file  Occupational History   Not on file  Tobacco Use   Smoking status: Never   Smokeless tobacco: Never   Tobacco comments:    smoked 2-4 cigarettes for about 2-3 weeks   Vaping Use   Vaping status: Never Used  Substance and Sexual Activity   Alcohol use: Not Currently   Drug use: Never   Sexual activity: Not Currently  Other Topics Concern   Not on file  Social History Narrative   Not on file   Social Determinants of Mack   Financial Resource Strain: Not on file  Food Insecurity: No Food Insecurity (11/21/2022)   Hunger Vital Sign    Worried About Running Out of Food in the Last Year: Never true    Ran Out of Food in the Last Year: Never true  Transportation Needs: No Transportation Needs (11/21/2022)   PRAPARE - Administrator, Civil Service (Medical): No    Lack of Transportation (Non-Medical): No  Physical Activity: Not on file  Stress: Not on file  Social Connections: Not on file  Intimate Partner Violence: Not At Risk (11/21/2022)   Humiliation, Afraid, Rape, and Kick questionnaire    Fear of Current or Ex-Partner: No    Emotionally Abused: No    Physically Abused: No    Sexually Abused: No    FH:  Family History  Problem Relation Age of Onset   Hypertension Mother    Diabetes Mother    Diabetes Father    Hypertension Father  High Cholesterol Father    Congestive Heart Failure Father    Breast cancer Cousin     Past Medical History:  Diagnosis Date   Acute bilateral deep vein thrombosis (DVT) of femoral veins (HCC) 01/02/2021   Acute massive pulmonary embolism (HCC) 08/25/2020   Last Assessment & Plan:  Formatting of this note might be different from the original. Post vena caval filter and on xarelto and O2   Anemia    ARF (acute respiratory failure) (HCC)    Cellulitis of left lower leg 06/20/2021   CHF (congestive heart failure) (HCC)    COVID-19    Diabetes mellitus without complication (HCC)    DVT (deep venous  thrombosis) (HCC) 09/06/2020   Endometrial cancer (HCC)    GAVE (gastric antral vascular ectasia)    GERD (gastroesophageal reflux disease)    High cholesterol    Hx of blood clots    Hyperlipidemia    Hypertension    IDA (iron deficiency anemia) 09/21/2020   Obesity    Pneumonia 06/11/2022   recovered   Pressure injury of skin 06/21/2021   Pulmonary embolism (HCC)    Sepsis (HCC)    Thrombocytopenia (HCC)    Urinary tract infection 09/13/2021    Current Outpatient Medications  Medication Sig Dispense Refill   acetaminophen (TYLENOL) 500 MG tablet Take 500 mg by mouth every 6 (six) hours as needed for mild pain or fever.     albuterol (VENTOLIN HFA) 108 (90 Base) MCG/ACT inhaler Inhale 2 puffs into the lungs every 6 (six) hours as needed for wheezing or shortness of breath. 8 g 0   Ascorbic Acid (VITAMIN C) 1000 MG tablet Take 1,000 mg by mouth daily.     FEROSUL 325 (65 Fe) MG tablet Take 325 mg by mouth daily with breakfast.     fluticasone furoate-vilanterol (BREO ELLIPTA) 100-25 MCG/ACT AEPB Inhale 1 puff into the lungs daily.     furosemide (LASIX) 20 MG tablet TAKE 1 TABLET BY MOUTH DAILY 30 tablet 3   loperamide (IMODIUM) 2 MG capsule Take 2 mg by mouth as needed for diarrhea or loose stools.     Multiple Vitamin (MULTIVITAMIN WITH MINERALS) TABS tablet Take 1 tablet by mouth daily.     pantoprazole (PROTONIX) 40 MG tablet Take 1 tablet (40 mg total) by mouth 2 (two) times daily. 60 tablet 1   rosuvastatin (CRESTOR) 10 MG tablet Take 1 tablet (10 mg total) by mouth daily. 15 tablet 0   No current facility-administered medications for this visit.   Vitals:   01/21/23 1436  BP: (!) 96/48  Pulse: 80  SpO2: 98%  Weight: 190 lb (86.2 kg)   Wt Readings from Last 3 Encounters:  01/21/23 190 lb (86.2 kg)  12/24/22 185 lb (83.9 kg)  12/16/22 183 lb (83 kg)   Lab Results  Component Value Date   CREATININE 0.97 12/24/2022   CREATININE 0.90 11/23/2022   CREATININE 1.04  (H) 11/22/2022   PHYSICAL EXAM:  General:  Well appearing. No resp difficulty HEENT: normal Neck: supple. JVP flat. No lymphadenopathy or thryomegaly appreciated. Cor: PMI normal. Regular rate & rhythm. No rubs, gallops or murmurs. Lungs: clear Abdomen: soft, nontender, nondistended. No hepatosplenomegaly. No bruits or masses.  Extremities: no cyanosis, clubbing, rash, edema Neuro: alert & orientedx3, cranial nerves grossly intact. Moves all 4 extremities w/o difficulty. Affect pleasant.   ECG: not done   ASSESSMENT & PLAN:  1: NICM with preserved ejection fraction- - suspect due to HTN -  NYHA class II - euvolemic today - weighing daily; reminded to call for an overnight weight gain of > 2 pounds or a weekly weight gain of > 5 pounds - weight up 16 pounds from last visit here 2 months ago but patient says her weight at home stays around 190 pounds; question accuracy of last visit's weight - Echo 09/12/21: EF of 55-60% along with mild LVH/ LAE & mild MR.  - Echo 02/25/22: EF of 65-70%.  - Echo 11/21/22: EF 60-65% along with Grade I DD and normal PA pressure of 17.6 mmHg. - not adding salt except on rare occasions;  - saw cardiology Courtney Mack) 03/23; most recently during recent admission 05/24; put on her AVS for them to call and get f/u scheduled - continue furosemide 20mg  daily with additional 20mg  if needed - recurrent UTI's so not a candidate for SGLT2 - reviewed the importance of keeping her total daily fluid intake to 60-64oz and that, currently, she's drinking >100 oz daily - BNP 11/20/22 was 41.4   2: HTN- - BP 96/48 - follows with PCP at [facility] & returns 07/24 - BMP 12/24/22 reviewed and showed sodium 140, potassium 3.4, creatinine 0.97 and GFR >60   3: DM- - A1c 11/16/21 was 5.7%   4: Anemia- - hemoglobin 12/24/22 was 8.9 - saw hematology Courtney Mack) 06/24 - iron infusion done 10/08/22  5: Left lower extremity DVT- - has IVC in place - unable to tolerate  anticoagulation due to high bleeding risk   6: Recurrent UTI- - saw urology Courtney Mack) 10/23 - underwent nephrogram and nephrostomy tube exchange in the past - not a candidate for SGLT2  Return in 3 months, sooner if needed.

## 2023-01-29 ENCOUNTER — Inpatient Hospital Stay: Payer: Medicare HMO

## 2023-02-03 NOTE — Progress Notes (Signed)
Patient for IR LT Nephrostomy Exchange on Tuesday 02/04/2023, I called and spoke with the patient on the phone and gave pre-procedure instructions. Pt was made aware to be here at 2p. Pt stated understanding.  Called 01/29/2023

## 2023-02-04 ENCOUNTER — Ambulatory Visit
Admission: RE | Admit: 2023-02-04 | Discharge: 2023-02-04 | Disposition: A | Discharge status: Home / Self Care | Payer: Medicare HMO | Source: Ambulatory Visit | Attending: Interventional Radiology | Admitting: Interventional Radiology

## 2023-02-05 ENCOUNTER — Ambulatory Visit: Payer: Medicare HMO | Admitting: Urology

## 2023-02-10 ENCOUNTER — Emergency Department: Payer: Medicare HMO

## 2023-02-10 ENCOUNTER — Other Ambulatory Visit: Payer: Self-pay

## 2023-02-10 ENCOUNTER — Inpatient Hospital Stay
Admission: EM | Admit: 2023-02-10 | Discharge: 2023-02-12 | DRG: 698 | Disposition: A | Discharge status: Home / Self Care | Payer: Medicare HMO | Attending: Internal Medicine | Admitting: Internal Medicine

## 2023-02-10 DIAGNOSIS — R6521 Severe sepsis with septic shock: Secondary | ICD-10-CM | POA: Diagnosis present

## 2023-02-10 DIAGNOSIS — Z83438 Family history of other disorder of lipoprotein metabolism and other lipidemia: Secondary | ICD-10-CM

## 2023-02-10 DIAGNOSIS — N390 Urinary tract infection, site not specified: Secondary | ICD-10-CM | POA: Diagnosis present

## 2023-02-10 DIAGNOSIS — E7800 Pure hypercholesterolemia, unspecified: Secondary | ICD-10-CM | POA: Diagnosis present

## 2023-02-10 DIAGNOSIS — E872 Acidosis, unspecified: Secondary | ICD-10-CM | POA: Diagnosis present

## 2023-02-10 DIAGNOSIS — L8990 Pressure ulcer of unspecified site, unspecified stage: Secondary | ICD-10-CM | POA: Insufficient documentation

## 2023-02-10 DIAGNOSIS — Z79899 Other long term (current) drug therapy: Secondary | ICD-10-CM

## 2023-02-10 DIAGNOSIS — A4159 Other Gram-negative sepsis: Secondary | ICD-10-CM | POA: Diagnosis present

## 2023-02-10 DIAGNOSIS — Z8249 Family history of ischemic heart disease and other diseases of the circulatory system: Secondary | ICD-10-CM | POA: Diagnosis not present

## 2023-02-10 DIAGNOSIS — I5032 Chronic diastolic (congestive) heart failure: Secondary | ICD-10-CM | POA: Diagnosis present

## 2023-02-10 DIAGNOSIS — N1330 Unspecified hydronephrosis: Secondary | ICD-10-CM | POA: Diagnosis present

## 2023-02-10 DIAGNOSIS — D509 Iron deficiency anemia, unspecified: Secondary | ICD-10-CM | POA: Diagnosis present

## 2023-02-10 DIAGNOSIS — Z7951 Long term (current) use of inhaled steroids: Secondary | ICD-10-CM

## 2023-02-10 DIAGNOSIS — Y731 Therapeutic (nonsurgical) and rehabilitative gastroenterology and urology devices associated with adverse incidents: Secondary | ICD-10-CM | POA: Diagnosis present

## 2023-02-10 DIAGNOSIS — I82409 Acute embolism and thrombosis of unspecified deep veins of unspecified lower extremity: Secondary | ICD-10-CM | POA: Diagnosis present

## 2023-02-10 DIAGNOSIS — E861 Hypovolemia: Secondary | ICD-10-CM | POA: Diagnosis not present

## 2023-02-10 DIAGNOSIS — K31819 Angiodysplasia of stomach and duodenum without bleeding: Secondary | ICD-10-CM | POA: Diagnosis present

## 2023-02-10 DIAGNOSIS — Z6838 Body mass index (BMI) 38.0-38.9, adult: Secondary | ICD-10-CM

## 2023-02-10 DIAGNOSIS — I110 Hypertensive heart disease with heart failure: Secondary | ICD-10-CM | POA: Diagnosis present

## 2023-02-10 DIAGNOSIS — Z8616 Personal history of COVID-19: Secondary | ICD-10-CM | POA: Diagnosis not present

## 2023-02-10 DIAGNOSIS — A419 Sepsis, unspecified organism: Secondary | ICD-10-CM | POA: Diagnosis not present

## 2023-02-10 DIAGNOSIS — R6520 Severe sepsis without septic shock: Secondary | ICD-10-CM | POA: Diagnosis not present

## 2023-02-10 DIAGNOSIS — Z86718 Personal history of other venous thrombosis and embolism: Secondary | ICD-10-CM

## 2023-02-10 DIAGNOSIS — Z803 Family history of malignant neoplasm of breast: Secondary | ICD-10-CM

## 2023-02-10 DIAGNOSIS — Z1152 Encounter for screening for COVID-19: Secondary | ICD-10-CM

## 2023-02-10 DIAGNOSIS — Z95828 Presence of other vascular implants and grafts: Secondary | ICD-10-CM

## 2023-02-10 DIAGNOSIS — R319 Hematuria, unspecified: Secondary | ICD-10-CM | POA: Diagnosis not present

## 2023-02-10 DIAGNOSIS — I959 Hypotension, unspecified: Secondary | ICD-10-CM | POA: Diagnosis present

## 2023-02-10 DIAGNOSIS — J449 Chronic obstructive pulmonary disease, unspecified: Secondary | ICD-10-CM | POA: Diagnosis present

## 2023-02-10 DIAGNOSIS — Z86711 Personal history of pulmonary embolism: Secondary | ICD-10-CM

## 2023-02-10 DIAGNOSIS — J9811 Atelectasis: Secondary | ICD-10-CM | POA: Diagnosis present

## 2023-02-10 DIAGNOSIS — Z936 Other artificial openings of urinary tract status: Secondary | ICD-10-CM

## 2023-02-10 DIAGNOSIS — D500 Iron deficiency anemia secondary to blood loss (chronic): Secondary | ICD-10-CM | POA: Diagnosis present

## 2023-02-10 DIAGNOSIS — Z87440 Personal history of urinary (tract) infections: Secondary | ICD-10-CM

## 2023-02-10 DIAGNOSIS — E1165 Type 2 diabetes mellitus with hyperglycemia: Secondary | ICD-10-CM | POA: Diagnosis present

## 2023-02-10 DIAGNOSIS — E669 Obesity, unspecified: Secondary | ICD-10-CM | POA: Diagnosis present

## 2023-02-10 DIAGNOSIS — Z833 Family history of diabetes mellitus: Secondary | ICD-10-CM

## 2023-02-10 DIAGNOSIS — E785 Hyperlipidemia, unspecified: Secondary | ICD-10-CM | POA: Diagnosis present

## 2023-02-10 DIAGNOSIS — Z8542 Personal history of malignant neoplasm of other parts of uterus: Secondary | ICD-10-CM

## 2023-02-10 DIAGNOSIS — I890 Lymphedema, not elsewhere classified: Secondary | ICD-10-CM | POA: Diagnosis present

## 2023-02-10 DIAGNOSIS — T83512A Infection and inflammatory reaction due to nephrostomy catheter, initial encounter: Principal | ICD-10-CM | POA: Diagnosis present

## 2023-02-10 DIAGNOSIS — K219 Gastro-esophageal reflux disease without esophagitis: Secondary | ICD-10-CM | POA: Diagnosis present

## 2023-02-10 DIAGNOSIS — R509 Fever, unspecified: Secondary | ICD-10-CM | POA: Diagnosis not present

## 2023-02-10 DIAGNOSIS — N179 Acute kidney failure, unspecified: Secondary | ICD-10-CM | POA: Diagnosis present

## 2023-02-10 LAB — URINALYSIS, W/ REFLEX TO CULTURE (INFECTION SUSPECTED)
Bilirubin Urine: NEGATIVE
Glucose, UA: NEGATIVE mg/dL
Ketones, ur: NEGATIVE mg/dL
Nitrite: NEGATIVE
Protein, ur: >=300 mg/dL — AB
RBC / HPF: >50 RBC/hpf (ref 0–5)
Specific Gravity, Urine: 1.016 (ref 1.005–1.030)
Squamous Epithelial / HPF: NONE SEEN /HPF (ref 0–5)
WBC, UA: >50 WBC/hpf (ref 0–5)
pH: 8 (ref 5.0–8.0)

## 2023-02-10 LAB — COMPREHENSIVE METABOLIC PANEL
ALT: 17 U/L (ref 0–44)
AST: 26 U/L (ref 15–41)
Albumin: 3 g/dL — ABNORMAL LOW (ref 3.5–5.0)
Alkaline Phosphatase: 64 U/L (ref 38–126)
Anion gap: 10 (ref 5–15)
BUN: 29 mg/dL — ABNORMAL HIGH (ref 8–23)
CO2: 19 mmol/L — ABNORMAL LOW (ref 22–32)
Calcium: 8.6 mg/dL — ABNORMAL LOW (ref 8.9–10.3)
Chloride: 105 mmol/L (ref 98–111)
Creatinine, Ser: 1.39 mg/dL — ABNORMAL HIGH (ref 0.44–1.00)
GFR, Estimated: 41 mL/min — ABNORMAL LOW (ref >60)
Glucose, Bld: 158 mg/dL — ABNORMAL HIGH (ref 70–99)
Potassium: 3.5 mmol/L (ref 3.5–5.1)
Sodium: 134 mmol/L — ABNORMAL LOW (ref 135–145)
Total Bilirubin: 2.1 mg/dL — ABNORMAL HIGH (ref 0.3–1.2)
Total Protein: 6.8 g/dL (ref 6.5–8.1)

## 2023-02-10 LAB — CBC WITH DIFFERENTIAL/PLATELET
Abs Immature Granulocytes: 0.06 10*3/uL (ref 0.00–0.07)
Basophils Absolute: 0 10*3/uL (ref 0.0–0.1)
Basophils Relative: 0 %
Eosinophils Absolute: 0.1 10*3/uL (ref 0.0–0.5)
Eosinophils Relative: 1 %
HCT: 31.5 % — ABNORMAL LOW (ref 36.0–46.0)
Hemoglobin: 9.8 g/dL — ABNORMAL LOW (ref 12.0–15.0)
Immature Granulocytes: 1 %
Lymphocytes Relative: 6 %
Lymphs Abs: 0.6 10*3/uL — ABNORMAL LOW (ref 0.7–4.0)
MCH: 27.1 pg (ref 26.0–34.0)
MCHC: 31.1 g/dL (ref 30.0–36.0)
MCV: 87.3 fL (ref 80.0–100.0)
Monocytes Absolute: 0.5 10*3/uL (ref 0.1–1.0)
Monocytes Relative: 6 %
Neutro Abs: 8 10*3/uL — ABNORMAL HIGH (ref 1.7–7.7)
Neutrophils Relative %: 86 %
Platelets: 189 10*3/uL (ref 150–400)
RBC: 3.61 MIL/uL — ABNORMAL LOW (ref 3.87–5.11)
RDW: 20.6 % — ABNORMAL HIGH (ref 11.5–15.5)
WBC: 9.3 10*3/uL (ref 4.0–10.5)
nRBC: 0 % (ref 0.0–0.2)

## 2023-02-10 LAB — SARS CORONAVIRUS 2 BY RT PCR: SARS Coronavirus 2 by RT PCR: NEGATIVE

## 2023-02-10 LAB — LACTIC ACID, PLASMA
Lactic Acid, Venous: 2.6 mmol/L (ref 0.5–1.9)
Lactic Acid, Venous: 2.5 mmol/L (ref 0.5–1.9)

## 2023-02-10 MED ORDER — SODIUM CHLORIDE 0.9 % IV BOLUS
500.0000 mL | Freq: Once | INTRAVENOUS | Status: AC
  Administered 2023-02-10: 500 mL via INTRAVENOUS

## 2023-02-10 MED ORDER — SODIUM CHLORIDE 0.9 % IV SOLN
1.0000 g | Freq: Once | INTRAVENOUS | Status: AC
  Administered 2023-02-10: 1 g via INTRAVENOUS
  Filled 2023-02-10: qty 10

## 2023-02-10 MED ORDER — HYDRALAZINE HCL 20 MG/ML IJ SOLN: 5.0000 mg | Freq: Three times a day (TID) | INTRAMUSCULAR | Status: DC | PRN

## 2023-02-10 MED ORDER — ACETAMINOPHEN 650 MG RE SUPP: 650.0000 mg | Freq: Four times a day (QID) | RECTAL | Status: DC | PRN

## 2023-02-10 MED ORDER — ONDANSETRON HCL 4 MG PO TABS: 4.0000 mg | ORAL_TABLET | Freq: Four times a day (QID) | ORAL | Status: DC | PRN

## 2023-02-10 MED ORDER — SODIUM CHLORIDE 0.9 % IV SOLN: INTRAVENOUS | Status: AC

## 2023-02-10 MED ORDER — SODIUM CHLORIDE 0.9 % IV SOLN
2.0000 g | INTRAVENOUS | Status: DC
  Administered 2023-02-11: 2 g via INTRAVENOUS
  Filled 2023-02-10 (×2): qty 20

## 2023-02-10 MED ORDER — ENOXAPARIN SODIUM 40 MG/0.4ML IJ SOSY
40.0000 mg | PREFILLED_SYRINGE | INTRAMUSCULAR | Status: DC
  Administered 2023-02-10 – 2023-02-11 (×2): 40 mg via SUBCUTANEOUS
  Filled 2023-02-10 (×2): qty 0.4

## 2023-02-10 MED ORDER — ALBUTEROL SULFATE (2.5 MG/3ML) 0.083% IN NEBU: 3.0000 mL | INHALATION_SOLUTION | Freq: Four times a day (QID) | RESPIRATORY_TRACT | Status: DC | PRN

## 2023-02-10 MED ORDER — MIDODRINE HCL 5 MG PO TABS
10.0000 mg | ORAL_TABLET | Freq: Three times a day (TID) | ORAL | Status: AC
  Administered 2023-02-10: 10 mg via ORAL
  Filled 2023-02-10: qty 2

## 2023-02-10 MED ORDER — FLUTICASONE FUROATE-VILANTEROL 100-25 MCG/ACT IN AEPB
1.0000 | INHALATION_SPRAY | Freq: Every day | RESPIRATORY_TRACT | Status: DC
  Administered 2023-02-12: 1 via RESPIRATORY_TRACT
  Filled 2023-02-10: qty 28

## 2023-02-10 MED ORDER — VITAMIN C 500 MG PO TABS
1000.0000 mg | ORAL_TABLET | Freq: Every day | ORAL | Status: DC
  Administered 2023-02-12: 1000 mg via ORAL
  Filled 2023-02-10: qty 2

## 2023-02-10 MED ORDER — LACTATED RINGERS IV BOLUS
1000.0000 mL | Freq: Once | INTRAVENOUS | Status: AC
  Administered 2023-02-10: 1000 mL via INTRAVENOUS

## 2023-02-10 MED ORDER — ADULT MULTIVITAMIN W/MINERALS CH
1.0000 | ORAL_TABLET | Freq: Every day | ORAL | Status: DC
  Administered 2023-02-12: 1 via ORAL
  Filled 2023-02-10: qty 1

## 2023-02-10 MED ORDER — SENNOSIDES-DOCUSATE SODIUM 8.6-50 MG PO TABS: 1.0000 | ORAL_TABLET | Freq: Every evening | ORAL | Status: DC | PRN

## 2023-02-10 MED ORDER — NYSTATIN 100000 UNIT/GM EX POWD: 1.0000 | Freq: Four times a day (QID) | CUTANEOUS | Status: DC | PRN

## 2023-02-10 MED ORDER — CHLORHEXIDINE GLUCONATE CLOTH 2 % EX PADS
6.0000 | MEDICATED_PAD | Freq: Every day | CUTANEOUS | Status: DC
  Administered 2023-02-11: 6 via TOPICAL

## 2023-02-10 MED ORDER — ONDANSETRON HCL 4 MG/2ML IJ SOLN: 4.0000 mg | Freq: Four times a day (QID) | INTRAMUSCULAR | Status: DC | PRN

## 2023-02-10 MED ORDER — ROSUVASTATIN CALCIUM 10 MG PO TABS
10.0000 mg | ORAL_TABLET | Freq: Every day | ORAL | Status: DC
  Administered 2023-02-12: 10 mg via ORAL
  Filled 2023-02-10: qty 1

## 2023-02-10 MED ORDER — ACETAMINOPHEN 325 MG PO TABS
650.0000 mg | ORAL_TABLET | Freq: Four times a day (QID) | ORAL | Status: DC | PRN
  Administered 2023-02-10: 650 mg via ORAL
  Filled 2023-02-10: qty 2

## 2023-02-10 MED ORDER — PANTOPRAZOLE SODIUM 40 MG PO TBEC
40.0000 mg | DELAYED_RELEASE_TABLET | Freq: Two times a day (BID) | ORAL | Status: DC
  Administered 2023-02-11 – 2023-02-12 (×3): 40 mg via ORAL
  Filled 2023-02-10 (×3): qty 1

## 2023-02-10 MED ORDER — LACTATED RINGERS IV SOLN: INTRAVENOUS | Status: DC

## 2023-02-10 MED ORDER — VANCOMYCIN HCL IN DEXTROSE 1-5 GM/200ML-% IV SOLN
1000.0000 mg | Freq: Once | INTRAVENOUS | Status: AC
  Administered 2023-02-10: 1000 mg via INTRAVENOUS
  Filled 2023-02-10: qty 200

## 2023-02-10 MED ORDER — LOPERAMIDE HCL 2 MG PO CAPS: 2.0000 mg | ORAL_CAPSULE | ORAL | Status: DC | PRN

## 2023-02-10 NOTE — Assessment & Plan Note (Addendum)
Responded to IV fluid Status post LR 1 L bolus x 2 were given by EDP I ordered additional sodium chloride 500 mL bolus to complete sepsis bolus on admission NS infusion at 125 mL/h, 1 day ordered Continue with ceftriaxone 2 g IV daily starting on 02/11/2023, 6 additional days ordered to complete 7-day course Blood cultures x 2 and urine culture are in process

## 2023-02-10 NOTE — Assessment & Plan Note (Signed)
Home rosuvastatin 10 mg daily resumed

## 2023-02-10 NOTE — Assessment & Plan Note (Signed)
-   Home PPI resumed 

## 2023-02-10 NOTE — ED Provider Notes (Signed)
[facility] Provider Note    Event Date/Time   First MD Initiated Contact with Patient 02/10/23 1132     (approximate)   History   Fever (/), Weakness, and Shortness of Breath   HPI  Courtney Mack is a 67 y.o. female presents to the emergency department today because of concerns for fevers and shortness of breath.  Patient started having fevers yesterday.  Some shortness of breath although that was worse this morning.  Additionally the patient has generalized fatigue and states that she feels bad.  The patient denies any associated chest pain although has had a cough.  No known sick contacts.  In addition the patient has noticed decreased output in her nephrostomy bag.  She has noticed some bad odor to her urine recently.     Physical Exam   Triage Vital Signs: ED Triage Vitals  Encounter Vitals Group     BP 02/10/23 1137 (!) 82/42     Systolic BP Percentile --      Diastolic BP Percentile --      Pulse Rate 02/10/23 1137 88     Resp 02/10/23 1137 (!) 22     Temp 02/10/23 1137 98.6 F (37 C)     Temp Source 02/10/23 1137 Oral     SpO2 02/10/23 1133 98 %     Weight 02/10/23 1139 190 lb (86.2 kg)     Height 02/10/23 1139 4\' 11"  (1.499 m)     Head Circumference --      Peak Flow --      Pain Score 02/10/23 1138 10     Pain Loc --      Pain Education --      Exclude from Growth Chart --     Most recent vital signs: Vitals:   02/10/23 1137 02/10/23 1143  BP: (!) 82/42 (!) 88/43  Pulse: 88 87  Resp: (!) 22 17  Temp: 98.6 F (37 C)   SpO2: 96% 95%    General: Awake, alert, oriented. CV:  Good peripheral perfusion. Regular rate and rhythm. Resp:  Normal effort. Lungs clear. Abd:  No distention. Non tender. Other:  Bilateral lower extremity edema   ED Results / Procedures / Treatments   Labs (all labs ordered are listed, but only abnormal results are displayed) Labs Reviewed  LACTIC ACID, PLASMA - Abnormal; Notable for the  following components:      Result Value   Lactic Acid, Venous 2.6 (*)    All other components within normal limits  COMPREHENSIVE METABOLIC PANEL - Abnormal; Notable for the following components:   Sodium 134 (*)    CO2 19 (*)    Glucose, Bld 158 (*)    BUN 29 (*)    Creatinine, Ser 1.39 (*)    Calcium 8.6 (*)    Albumin 3.0 (*)    Total Bilirubin 2.1 (*)    GFR, Estimated 41 (*)    All other components within normal limits  CBC WITH DIFFERENTIAL/PLATELET - Abnormal; Notable for the following components:   RBC 3.61 (*)    Hemoglobin 9.8 (*)    HCT 31.5 (*)    RDW 20.6 (*)    Neutro Abs 8.0 (*)    Lymphs Abs 0.6 (*)    All other components within normal limits  URINALYSIS, W/ REFLEX TO CULTURE (INFECTION SUSPECTED) - Abnormal; Notable for the following components:   Color, Urine YELLOW (*)    APPearance TURBID (*)  Hgb urine dipstick SMALL (*)    Protein, ur >=300 (*)    Leukocytes,Ua MODERATE (*)    Bacteria, UA MANY (*)    All other components within normal limits  CULTURE, BLOOD (ROUTINE X 2)  CULTURE, BLOOD (ROUTINE X 2)  SARS CORONAVIRUS 2 BY RT PCR  URINE CULTURE  LACTIC ACID, PLASMA     EKG  I, Phineas Semen, attending physician, personally viewed and interpreted this EKG  EKG Time: 1137 Rate: 88 Rhythm: sinus rhythm Axis: normal Intervals: qtc 437 QRS: narrow, low voltage precordial leads ST changes: no st elevation Impression: abnormal ekg    RADIOLOGY I independently interpreted and visualized the CXR. My interpretation: No pneumonia Radiology interpretation:  IMPRESSION:  Linear opacity in the right mid lung field is favored to represent  atelectasis.      PROCEDURES:  Critical Care performed: Yes  CRITICAL CARE Performed by: Phineas Semen   Total critical care time: 35 minutes  Critical care time was exclusive of separately billable procedures and treating other patients.  Critical care was necessary to treat or prevent  imminent or life-threatening deterioration.  Critical care was time spent personally by me on the following activities: development of treatment plan with patient and/or surrogate as well as nursing, discussions with consultants, evaluation of patient's response to treatment, examination of patient, obtaining history from patient or surrogate, ordering and performing treatments and interventions, ordering and review of laboratory studies, ordering and review of radiographic studies, pulse oximetry and re-evaluation of patient's condition.   Procedures    MEDICATIONS ORDERED IN ED: Medications  lactated ringers bolus 1,000 mL (has no administration in time range)     IMPRESSION / MDM / ASSESSMENT AND PLAN / ED COURSE  I reviewed the triage vital signs and the nursing notes.                              Differential diagnosis includes, but is not limited to, covid, pneumonia, COPD, dehydration, UTI  Patient's presentation is most consistent with acute presentation with potential threat to life or bodily function.   The patient is on the cardiac monitor to evaluate for evidence of arrhythmia and/or significant heart rate changes.  Patient presents to the emergency department today with complaints of fever, shortness of breath and decreased nephrostomy output.  Initial exam here patient is afebrile however hypotensive.  Will start IV fluids.  Will order sepsis type workup.  Patient's blood work out leukocytosis.  The patient did have a lactic acidosis.  Urine is concerning for possible infection.  Patient's blood pressure while low did improve after IV fluid boluses.  Patient was started on multiple broad-spectrum antibiotics.  Discussed with Dr. Sedalia Muta with the hospitalist service who will plan on admission.     FINAL CLINICAL IMPRESSION(S) / ED DIAGNOSES   Final diagnoses:  Urinary tract infection with hematuria, site unspecified      Note:  This document was prepared using Dragon  voice recognition software and may include unintentional dictation errors.    Phineas Semen, MD 02/10/23 1455

## 2023-02-10 NOTE — Assessment & Plan Note (Signed)
Not in acute exacerbation.

## 2023-02-10 NOTE — ED Notes (Signed)
Dr Sedalia Muta made aware of low BP

## 2023-02-10 NOTE — Hospital Course (Signed)
[name] is a 67 year old female with history of left-sided hydronephrosis status post nephrostomy tube placement, GERD, hyperlipidemia, hypertension, COPD, history of extensive bilateral PE status post embolectomy and IVC placement, history of DVT, edema, chronic anemia, thrombocytopenia, tobacco use, history of heart failure preserved ejection fraction, who presents to the emergency department for chief concerns of fever, shortness of breath.  Vitals in the ED showed temperature of 98.6, respiration rate of 24, heart rate of 86, blood pressure initially 62/43, improved to 98/72, SpO2 of 95% on room air.  Serum sodium is 134, potassium 2.5, chloride 105, bicarb 19, BUN of 29, serum creatinine of 1.39, EGFR 41, nonfasting blood glucose 158, WBC 9.3, hemoglobin 9.8, platelets of 189.  Lactic acid is 2.6.  UA was positive for moderate leukocytes.  ED treatment: Ceftriaxone 1 g IV one-time dose, vancomycin 1 g IV, LR 1 L bolus x 2 were given.  8/6: Afebrile this morning, maximum temperature recorded 103 over the past 24-hour.  Overnight persistent hypertension, some improvement with MAP of 62-64 now.  Lactic acidosis resolved.  Hemoglobin decreased to 8, baseline appears to be around 8-8.9.  All cell lines decreased so some dilutional effect, 9.8 reading done yesterday was most likely concentrated.  Potassium 3.4, creatinine with some improvement to 1.11.  Preliminary blood cultures negative, urine cultures with Proteus Beninati-pending susceptibility. Urology was consulted as patient was due for nephrostomy tube exchange, they will see her today and likely will do nephrostomy tube exchange.  8/7: Patient remained hemodynamically stable.  Feeling at baseline, eating and drinking.  Does not want any home health services.  Urine culture susceptibility back with resistance only to ampicillin and nitrofurantoin.  Patient is being started on Bactrim for 7 more days. Discussed with urology and now  they are recommending tube exchange as outpatient.  Patient is being discharged on Bactrim for 7 days and midodrine 5 mg 3 times daily.  She was instructed to stop taking midodrine if blood pressure remained persistently above 130 systolic. Discussed with daughter and patient both.  Patient will continue on her current medications and need to have a close follow-up with her providers for further management.

## 2023-02-10 NOTE — ED Triage Notes (Addendum)
Pt comes from home by EMS for Fever (102), SOB, weakness, dizziness. A&Ox4. Pt has a nephrostomy bag has had a decrease in urine output. For the past 24 hrs.  All s/s started the past 24 hrs.  Pt report a 102 fever this morning, took 650mg  of tylenol around 0800.

## 2023-02-10 NOTE — Assessment & Plan Note (Signed)
-   Continue home iron supplement 

## 2023-02-10 NOTE — Assessment & Plan Note (Signed)
Patient had massive PE in February 2022 status post thrombectomy Patient had bilateral DVT in June 2022, patient is status post IVC filter

## 2023-02-10 NOTE — H&P (Addendum)
Addendum: Received message from nursing staff that, patient's latest blood pressure was 85/40 (MAP of 54).  At bedside, patient awake alert and oriented, mentation is unchanged from initial evaluation.  Patient does appear ill.  # Severe sepsis with septic shock Patient has received sepsis bolus (2 L LR bolus and NS 500 ml bolus on admission) - IVF changed from 125 ml/hr to 150 ml/hr - Blood cultures in process, urine culture in process - MAP remains at 57 - Midodrine 10 mg p.o. one-time dose ordered - Bed placement changed from PCU to stepdown - ICU service consulted for consideration of pressure support   History and Physical   Courtney Mack ZOX:[id number] DOB: 1955/08/04 DOA: 02/10/2023  [facility]: [facility]  Outpatient Specialists: Dr. Cathie Hoops, medical oncology/hematology Patient coming from: home via EMS  I have personally briefly reviewed patient's old medical records in [facility] EMR.  Chief Concern: weakness, fever  HPI: Courtney Mack is a 67 year old female with history of left-sided hydronephrosis status post nephrostomy tube placement, GERD, hyperlipidemia, hypertension, COPD, history of extensive bilateral PE status post embolectomy and IVC placement, history of DVT, edema, chronic anemia, thrombocytopenia, tobacco use, history of heart failure preserved ejection fraction, who presents to the emergency department for chief concerns of fever, shortness of breath.  Vitals in the ED showed temperature of 98.6, respiration rate of 24, heart rate of 86, blood pressure initially 62/43, improved to 98/72, SpO2 of 95% on room air.  Serum sodium is 134, potassium 2.5, chloride 105, bicarb 19, BUN of 29, serum creatinine of 1.39, EGFR 41, nonfasting blood glucose 158, WBC 9.3, hemoglobin 9.8, platelets of 189.  Lactic acid is 2.6.  UA was positive for moderate leukocytes.  ED treatment: Ceftriaxone 1 g IV one-time dose, vancomycin 1 g IV, LR 1 L bolus x  2 were given. --------------------------- At bedside, she was able to tell me her name, age, current location, current year.  She endorses fever that started yesterday, tmax of 102 and decreased nephrostomy urine output since yesterday. She denies dysuria, diarrhea, syncope.   She reports that at baseline, she walks with a walker, however this morning she tried to walk and felt really short of breath.   Social history: She lives at home with her daughter. She denies tobacco, etoh, and recreational drug use. She is retired.  ROS: Constitutional: no weight change, no fever ENT/Mouth: no sore throat, no rhinorrhea Eyes: no eye pain, no vision changes Cardiovascular: no chest pain, no dyspnea,  no edema, no palpitations Respiratory: no cough, no sputum, no wheezing Gastrointestinal: no nausea, no vomiting, no diarrhea, no constipation Genitourinary: no urinary incontinence, no dysuria, no hematuria Musculoskeletal: no arthralgias, no myalgias Skin: no skin lesions, no pruritus, Neuro: + weakness, no loss of consciousness, no syncope Psych: no anxiety, no depression, + decrease appetite Heme/Lymph: no bruising, no bleeding  ED Course: Discussed with emergency medicine provider, patient requiring hospitalization for chief concerns of sepsis.  Assessment/Plan  Principal Problem:   Severe sepsis with acute organ dysfunction (HCC) Active Problems:   Hypotension   Recurrent deep vein thrombosis (DVT) (HCC)   Chronic diastolic CHF (congestive heart failure) (HCC)   Nephrostomy status (HCC)   Dyslipidemia   IDA (iron deficiency anemia)   Gastric antral vascular ectasia   COPD (chronic obstructive pulmonary disease) (HCC)   GERD without esophagitis   Assessment and Plan:  * Severe sepsis with acute organ dysfunction (HCC) With septic shock, responded to IV fluid Patient  had fever at home of 102 lactic acidosis of 2.6, source is likely urine, with septic shock responded to IV fluid,  organ involvement is renal Continue with ceftriaxone 2 g IV daily Ordered additional sodium chloride 500 mL bolus to complete sepsis bolus on admission Blood cultures x 2 and urine culture are in process Admit to PCU, inpatient  Hypotension Responded to IV fluid Status post LR 1 L bolus x 2 were given by EDP I ordered additional sodium chloride 500 mL bolus to complete sepsis bolus on admission NS infusion at 125 mL/h, 1 day ordered Continue with ceftriaxone 2 g IV daily starting on 02/11/2023, 6 additional days ordered to complete 7-day course Blood cultures x 2 and urine culture are in process  Chronic diastolic CHF (congestive heart failure) (HCC) Not in acute exacerbation  Recurrent deep vein thrombosis (DVT) (HCC) Patient had massive PE in February 2022 status post thrombectomy Patient had bilateral DVT in June 2022, patient is status post IVC filter  COPD (chronic obstructive pulmonary disease) (HCC) Not in acute exacerbation  Gastric antral vascular ectasia Home PPI resumed  IDA (iron deficiency anemia) Baseline  Dyslipidemia Home rosuvastatin 10 mg daily resumed  GERD without esophagitis Home PPI resumed  Chart reviewed.   DVT prophylaxis: Enoxaparin Code Status: full code Diet: Heart healthy Family Communication: A phone call was offered, patient declined stating that her daughter Luster Landsberg if she is in the hospital. Disposition Plan: Pending clinical course Consults called: None at this time Admission status: PCU, inpatient  Past Medical History:  Diagnosis Date   Acute bilateral deep vein thrombosis (DVT) of femoral veins (HCC) 01/02/2021   Acute massive pulmonary embolism (HCC) 08/25/2020   Last Assessment & Plan:  Formatting of this note might be different from the original. Post vena caval filter and on xarelto and O2   Anemia    ARF (acute respiratory failure) (HCC)    Cellulitis of left lower leg 06/20/2021   CHF (congestive heart failure) (HCC)     COVID-19    Diabetes mellitus without complication (HCC)    DVT (deep venous thrombosis) (HCC) 09/06/2020   Endometrial cancer (HCC)    GAVE (gastric antral vascular ectasia)    GERD (gastroesophageal reflux disease)    High cholesterol    Hx of blood clots    Hyperlipidemia    Hypertension    IDA (iron deficiency anemia) 09/21/2020   Obesity    Pneumonia 06/11/2022   recovered   Pressure injury of skin 06/21/2021   Pulmonary embolism (HCC)    Sepsis (HCC)    Thrombocytopenia (HCC)    Urinary tract infection 09/13/2021   Past Surgical History:  Procedure Laterality Date   CATARACT EXTRACTION W/PHACO Right 06/04/2022   Procedure: CATARACT EXTRACTION PHACO AND INTRAOCULAR LENS PLACEMENT (IOC) COMPLICATED RIGHT  31.83  02:17.4;  Surgeon: Galen Manila, MD;  Location: [facility];  Service: Ophthalmology;  Laterality: Right;   CATARACT EXTRACTION W/PHACO Left 07/09/2022   Procedure: CATARACT EXTRACTION PHACO AND INTRAOCULAR LENS PLACEMENT (IOC);  Surgeon: Galen Manila, MD;  Location: [facility];  Service: Ophthalmology;  Laterality: Left;  22.75 1:40.3   COLONOSCOPY N/A 06/21/2021   Procedure: COLONOSCOPY;  Surgeon: Toledo, Boykin Nearing, MD;  Location: [facility];  Service: Gastroenterology;  Laterality: N/A;   CYSTOSCOPY W/ URETERAL STENT PLACEMENT Left 09/11/2021   Procedure: CYSTOSCOPY WITH RETROGRADE PYELOGRAM/URETERAL STENT PLACEMENT;  Surgeon: Riki Altes, MD;  Location: [facility];  Service: Urology;  Laterality: Left;   CYSTOSCOPY W/  URETERAL STENT PLACEMENT Left 11/17/2021   Procedure: CYSTOSCOPY WITH STENT REPLACEMENT;  Surgeon: Bjorn Pippin, MD;  Location: [facility];  Service: Urology;  Laterality: Left;   CYSTOSCOPY WITH FULGERATION  11/17/2021   Procedure: CYSTOSCOPY WITH FULGERATION;  Surgeon: Bjorn Pippin, MD;  Location: [facility];  Service: Urology;;   CYSTOSCOPY WITH STENT PLACEMENT Left 10/17/2021   Procedure: CYSTOSCOPY WITH STENT PLACEMENT;   Surgeon: Riki Altes, MD;  Location: [facility];  Service: Urology;  Laterality: Left;   CYSTOSCOPY WITH URETEROSCOPY, STONE BASKETRY AND STENT PLACEMENT Left 10/16/2021   Procedure: CYSTOSCOPY WITH URETEROSCOPY  AND STENT REMOVAL;  Surgeon: Riki Altes, MD;  Location: [facility];  Service: Urology;  Laterality: Left;   ENTEROSCOPY N/A 08/17/2021   Procedure: ENTEROSCOPY;  Surgeon: Toney Reil, MD;  Location: [facility];  Service: Gastroenterology;  Laterality: N/A;   ESOPHAGOGASTRODUODENOSCOPY N/A 06/21/2021   Procedure: ESOPHAGOGASTRODUODENOSCOPY (EGD);  Surgeon: Toledo, Boykin Nearing, MD;  Location: [facility];  Service: Gastroenterology;  Laterality: N/A;   ESOPHAGOGASTRODUODENOSCOPY (EGD) WITH PROPOFOL N/A 08/17/2021   Procedure: ESOPHAGOGASTRODUODENOSCOPY (EGD) WITH PROPOFOL;  Surgeon: Toney Reil, MD;  Location: [facility];  Service: Gastroenterology;  Laterality: N/A;   ESOPHAGOGASTRODUODENOSCOPY (EGD) WITH PROPOFOL N/A 10/14/2021   Procedure: ESOPHAGOGASTRODUODENOSCOPY (EGD) WITH PROPOFOL;  Surgeon: Midge Minium, MD;  Location: [facility];  Service: Endoscopy;  Laterality: N/A;   ESOPHAGOGASTRODUODENOSCOPY (EGD) WITH PROPOFOL N/A 02/01/2022   Procedure: ESOPHAGOGASTRODUODENOSCOPY (EGD) WITH PROPOFOL;  Surgeon: Toney Reil, MD;  Location: [facility] ENDOSCOPY;  Service: Gastroenterology;  Laterality: N/A;   ESOPHAGOGASTRODUODENOSCOPY (EGD) WITH PROPOFOL N/A 03/15/2022   Procedure: ESOPHAGOGASTRODUODENOSCOPY (EGD) WITH PROPOFOL;  Surgeon: Wyline Mood, MD;  Location: [facility] END[facility]COPY;  Service: Gastroenterology;  Laterality: N/A;   ESOPHAGOGASTRODUODENOSCOPY (EGD) WITH PROPOFOL N/A 04/18/2022   Procedure: ESOPHAGOGASTRODUODENOSCOPY (EGD) WITH PROPOFOL;  Surgeon: Toney Reil, MD;  Location: [facility];  Service: Gastroenterology;  Laterality: N/A;   ESOPHAGOGASTRODUODENOSCOPY (EGD) WITH PROPOFOL N/A 08/09/2022   Procedure: ESOPHAGOGASTRODUODENOSCOPY (EGD)  WITH PROPOFOL;  Surgeon: Wyline Mood, MD;  Location: [facility] END[facility]COPY;  Service: Gastroenterology;  Laterality: N/A;   GIVENS CAPSULE STUDY N/A 06/22/2021   Procedure: GIVENS CAPSULE STUDY;  Surgeon: Toledo, Boykin Nearing, MD;  Location: [facility];  Service: Gastroenterology;  Laterality: N/A;   IR NEPHROSTOMY EXCHANGE LEFT  02/25/2022   IR NEPHROSTOMY EXCHANGE LEFT  03/27/2022   IR NEPHROSTOMY EXCHANGE LEFT  05/22/2022   IR NEPHROSTOMY EXCHANGE LEFT  07/24/2022   IR NEPHROSTOMY EXCHANGE LEFT  09/24/2022   IR NEPHROSTOMY EXCHANGE LEFT  11/21/2022   IR NEPHROSTOMY PLACEMENT LEFT  12/27/2021   IVC FILTER INSERTION N/A 08/18/2020   Procedure: IVC FILTER INSERTION;  Surgeon: Renford Dills, MD;  Location: [facility];  Service: Cardiovascular;  Laterality: N/A;   PERIPHERAL VASCULAR THROMBECTOMY Bilateral 01/03/2021   Procedure: PERIPHERAL VASCULAR THROMBECTOMY;  Surgeon: Annice Needy, MD;  Location: [facility];  Service: Cardiovascular;  Laterality: Bilateral;   PULMONARY THROMBECTOMY N/A 08/18/2020   Procedure: PULMONARY THROMBECTOMY;  Surgeon: Renford Dills, MD;  Location: [facility];  Service: Cardiovascular;  Laterality: N/A;   TUBAL LIGATION     VISCERAL ANGIOGRAPHY N/A 07/18/2021   Procedure: VISCERAL ANGIOGRAPHY;  Surgeon: Annice Needy, MD;  Location: [facility];  Service: Cardiovascular;  Laterality: N/A;   Social History:  reports that she has never smoked. She has never used smokeless tobacco. She reports that she does not currently use alcohol. She reports that she does not  use drugs.  No Known Allergies Family History  Problem Relation Age of Onset   Hypertension Mother    Diabetes Mother    Diabetes Father    Hypertension Father    High Cholesterol Father    Congestive Heart Failure Father    Breast cancer Cousin    Family history: Family history reviewed and not pertinent.  Prior to Admission medications   Medication Sig Start Date  End Date Taking? Authorizing Provider  acetaminophen (TYLENOL) 500 MG tablet Take 500 mg by mouth every 6 (six) hours as needed for mild pain or fever.   Yes [provider]  albuterol (VENTOLIN HFA) 108 (90 Base) MCG/ACT inhaler Inhale 2 puffs into the lungs every 6 (six) hours as needed for wheezing or shortness of breath. 10/26/22  Yes Marrion Coy, MD  Ascorbic Acid (VITAMIN C) 1000 MG tablet Take 1,000 mg by mouth daily.   Yes [provider]  FEROSUL 325 (65 Fe) MG tablet Take 325 mg by mouth daily with breakfast. 08/14/22  Yes [provider]  fluticasone furoate-vilanterol (BREO ELLIPTA) 100-25 MCG/ACT AEPB Inhale 1 puff into the lungs daily.   Yes [provider]  furosemide (LASIX) 20 MG tablet Take 1 tablet (20 mg total) by mouth daily. And additional 20mg  if needed 01/21/23  Yes Clarisa Kindred A, FNP  loperamide (IMODIUM) 2 MG capsule Take 2 mg by mouth as needed for diarrhea or loose stools. 09/27/22  Yes [provider]  Multiple Vitamin (MULTIVITAMIN WITH MINERALS) TABS tablet Take 1 tablet by mouth daily. 09/12/20  Yes Standley Brooking, MD  nystatin (MYCOSTATIN/NYSTOP) powder Apply 1 Application topically 4 (four) times daily as needed. 02/03/23  Yes [provider]  pantoprazole (PROTONIX) 40 MG tablet Take 1 tablet (40 mg total) by mouth 2 (two) times daily. 03/01/22 08/01/23 Yes Sreenath, Sudheer B, MD  rosuvastatin (CRESTOR) 10 MG tablet Take 1 tablet (10 mg total) by mouth daily. 01/21/23  Yes Clarisa Kindred A, FNP  triamcinolone cream (KENALOG) 0.1 % Apply 1 Application topically 2 (two) times daily as needed. 12/04/22  Yes [provider]   Physical Exam: Vitals:   02/10/23 1235 02/10/23 1240 02/10/23 1250 02/10/23 1510  BP: (!) 77/64 (!) 88/45 (!) 108/47 94/75  Pulse: 85 86 83 98  Resp: (!) 21 (!) 21 20 20   Temp:      TempSrc:      SpO2:    100%  Weight:      Height:       Constitutional: appears acutely ill,  frail Eyes: PERRL, lids and conjunctivae normal ENMT: Mucous membranes are moist. Posterior pharynx clear of any exudate or lesions. Age-appropriate dentition. Hearing appropriate Neck: normal, supple, no masses, no thyromegaly Respiratory: clear to auscultation bilaterally, no wheezing, no crackles. Normal respiratory effort. No accessory muscle use.  Cardiovascular: Regular rate and rhythm, no murmurs / rubs / gallops. No extremity edema. 2+ pedal pulses. No carotid bruits.  Abdomen: no tenderness, no masses palpated, no hepatosplenomegaly. Bowel sounds positive.  Musculoskeletal: no clubbing / cyanosis. No joint deformity upper and lower extremities. Good ROM, no contractures, no atrophy. Normal muscle tone.  Skin: no rashes, lesions, ulcers. No induration Neurologic: Sensation intact. Strength 4/5 in all 4.  Psychiatric: Normal judgment and insight. Alert and oriented x 3.  Depressed mood.   EKG: independently reviewed, showing sinus rhythm with rate of 88, QTc 437  Chest x-ray on Admission: I personally reviewed and I agree with radiologist reading as  below.  DG Chest Port 1 View  Result Date: 02/10/2023 CLINICAL DATA:  Sepsis EXAM: PORTABLE CHEST 1 VIEW COMPARISON:  CXR 12/24/22 FINDINGS: No pleural effusion. No pneumothorax. Linear opacity in the right lung field is favored to represent atelectasis. Low lung volumes. Normal cardiac and mediastinal contours. No radiographically apparent displaced rib fractures. Visualized upper abdomen is unremarkable. IMPRESSION: Linear opacity in the right mid lung field is favored to represent atelectasis. Electronically Signed   By: Lorenza Cambridge M.D.   On: 02/10/2023 12:38    Labs on Admission: I have personally reviewed following labs  CBC: Recent Labs  Lab 02/10/23 1153  WBC 9.3  NEUTROABS 8.0*  HGB 9.8*  HCT 31.5*  MCV 87.3  PLT 189   Basic Metabolic Panel: Recent Labs  Lab 02/10/23 1153  NA 134*  K 3.5  CL 105  CO2 19*  GLUCOSE  158*  BUN 29*  CREATININE 1.39*  CALCIUM 8.6*   GFR: Estimated Creatinine Clearance: 36.9 mL/min (A) (by C-G formula based on SCr of 1.39 mg/dL (H)).  Liver Function Tests: Recent Labs  Lab 02/10/23 1153  AST 26  ALT 17  ALKPHOS 64  BILITOT 2.1*  PROT 6.8  ALBUMIN 3.0*   Urine analysis:    Component Value Date/Time   COLORURINE YELLOW (A) 02/10/2023 1152   APPEARANCEUR TURBID (A) 02/10/2023 1152   APPEARANCEUR Cloudy (A) 01/03/2022 1443   LABSPEC 1.016 02/10/2023 1152   PHURINE 8.0 02/10/2023 1152   GLUCOSEU NEGATIVE 02/10/2023 1152   HGBUR SMALL (A) 02/10/2023 1152   BILIRUBINUR NEGATIVE 02/10/2023 1152   BILIRUBINUR Negative 01/03/2022 1443   KETONESUR NEGATIVE 02/10/2023 1152   PROTEINUR >=300 (A) 02/10/2023 1152   NITRITE NEGATIVE 02/10/2023 1152   LEUKOCYTESUR MODERATE (A) 02/10/2023 1152   CRITICAL CARE Performed by: Dr. Sedalia Muta  Total critical care time: 32 minutes  Critical care time was exclusive of separately billable procedures and treating other patients.  Critical care was necessary to treat or prevent imminent or life-threatening deterioration.  Critical care was time spent personally by me on the following activities: development of treatment plan with patient and/or surrogate as well as nursing, discussions with consultants, evaluation of patient's response to treatment, examination of patient, obtaining history from patient or surrogate, ordering and performing treatments and interventions, ordering and review of laboratory studies, ordering and review of radiographic studies, pulse oximetry and re-evaluation of patient's condition.  This document was prepared using Dragon Voice Recognition software and may include unintentional dictation errors.  Dr. Sedalia Muta [facility]  If 7PM-7AM, please contact overnight-coverage provider If 7AM-7PM, please contact day attending provider [email or web address]  02/10/2023, 3:22 PM

## 2023-02-10 NOTE — Consult Note (Signed)
CODE SEPSIS - PHARMACY COMMUNICATION  **Broad-spectrum antimicrobials should be administered within one hour of sepsis diagnosis**  Time Code Sepsis call or page was received: 1500  Antibiotics ordered: Ceftriaxone, Vancomycin  Time of first antibiotic administration: 1248  Additional action taken by pharmacy: N/A  If necessary, name of provider/nurse contacted: N/A    Will M. Dareen Piano, PharmD Clinical Pharmacist 02/10/2023 3:06 PM

## 2023-02-10 NOTE — Assessment & Plan Note (Signed)
With septic shock, responded to IV fluid Patient had fever at home of 102 lactic acidosis of 2.6, source is likely urine, with septic shock responded to IV fluid, organ involvement is renal Continue with ceftriaxone 2 g IV daily Ordered additional sodium chloride 500 mL bolus to complete sepsis bolus on admission Blood cultures x 2 and urine culture are in process Admit to PCU, inpatient

## 2023-02-11 ENCOUNTER — Ambulatory Visit: Admission: RE | Admit: 2023-02-11 | Payer: Medicare HMO | Source: Ambulatory Visit | Admitting: Radiology

## 2023-02-11 DIAGNOSIS — Z936 Other artificial openings of urinary tract status: Secondary | ICD-10-CM | POA: Diagnosis not present

## 2023-02-11 DIAGNOSIS — E861 Hypovolemia: Secondary | ICD-10-CM | POA: Diagnosis not present

## 2023-02-11 DIAGNOSIS — L8990 Pressure ulcer of unspecified site, unspecified stage: Secondary | ICD-10-CM | POA: Insufficient documentation

## 2023-02-11 DIAGNOSIS — A419 Sepsis, unspecified organism: Secondary | ICD-10-CM

## 2023-02-11 DIAGNOSIS — I82409 Acute embolism and thrombosis of unspecified deep veins of unspecified lower extremity: Secondary | ICD-10-CM

## 2023-02-11 DIAGNOSIS — I5032 Chronic diastolic (congestive) heart failure: Secondary | ICD-10-CM | POA: Diagnosis not present

## 2023-02-11 DIAGNOSIS — D500 Iron deficiency anemia secondary to blood loss (chronic): Secondary | ICD-10-CM

## 2023-02-11 DIAGNOSIS — K219 Gastro-esophageal reflux disease without esophagitis: Secondary | ICD-10-CM

## 2023-02-11 LAB — LACTIC ACID, PLASMA: Lactic Acid, Venous: 0.6 mmol/L (ref 0.5–1.9)

## 2023-02-11 MED ORDER — ORAL CARE MOUTH RINSE: 15.0000 mL | OROMUCOSAL | Status: DC | PRN

## 2023-02-11 MED ORDER — MIDODRINE HCL 5 MG PO TABS
5.0000 mg | ORAL_TABLET | Freq: Once | ORAL | Status: DC
  Filled 2023-02-11: qty 1

## 2023-02-11 MED ORDER — FERROUS SULFATE 325 (65 FE) MG PO TABS
325.0000 mg | ORAL_TABLET | Freq: Every day | ORAL | Status: DC
  Administered 2023-02-12: 325 mg via ORAL
  Filled 2023-02-11: qty 1

## 2023-02-11 MED ORDER — MIDODRINE HCL 5 MG PO TABS
5.0000 mg | ORAL_TABLET | Freq: Three times a day (TID) | ORAL | Status: DC
  Administered 2023-02-11 – 2023-02-12 (×4): 5 mg via ORAL
  Filled 2023-02-11 (×3): qty 1

## 2023-02-11 NOTE — Assessment & Plan Note (Signed)
Resolved. Likely secondary to sepsis.   -Monitor renal function -Avoid nephrotoxins

## 2023-02-11 NOTE — Assessment & Plan Note (Signed)
Patient has left-sided nephrostomy tube in place which was supposed to be exchanged today as outpatient. Urology was consulted and they will perform the exchange today while patient is being admitted. -Follow-up urology recommendations

## 2023-02-11 NOTE — Consult Note (Signed)
NAME:  Courtney Mack, MRN:  [id number], DOB:  [birth year], LOS: 1 ADMISSION DATE:  02/10/2023, CONSULTATION DATE:  02/10/23 REFERRING MD:  Londell Moh, CHIEF COMPLAINT:  Fever  HPI  67 y.o with significant PMH of recurrent DVT/PE (08/2020) and (12/2020) on Xarelto s/p thrombectomy with IVC filter placement, HTN, HLD, DM, Iron Deficiency Anemia, obesity, Endometrial Cancer, dCHF, cellulitis of Left leg, recurrent GI bleed December 2022 (Hgb 3.6), nonbleeding ulcer identified, Xarelto restarted, COVID March 2023, prior urosepsis 2/2 Proteus and Enterobacter bacteremia s/p left ureteral stent placement, Diastolic CHF,  who presented to the ED with chief complaints of fever and shortness of breath.   ED Course: Initial vital signs showed HR of beats/minute, BP mm Hg, the RR 30 breaths/minute, and the oxygen saturation % on and a temperature of 98.62F (36.9C). Pertinent Labs/Diagnostics Findings: Na+/ K+:134/3.5  Glucose: 158 BUN/Cr.:29/1.39  WBC:9.3 Hgb/Hct: 9.8/31.5  Lactic acid:2.6  COVID PCR: Negative UA+ CXR> Linear opacity in the right mid lung field is favored to represent atelectasis  Patient given 30 cc/kg of fluids and started on broad-spectrum antibiotics Vanco and Ceftriaxone for sepsis secondary to UTI. Patient admitted to hospitalist service. See significant events below.  Past Medical History    Acute bilateral deep vein thrombosis (DVT) of femoral veins (HCC) 01/02/2021   Acute massive pulmonary embolism (HCC) 08/25/2020    Last Assessment & Plan:  Formatting of this note might be different from the original. Post vena caval filter and on xarelto and O2   Anemia     ARF (acute respiratory failure) (HCC)     Cellulitis of left lower leg 06/20/2021   CHF (congestive heart failure) (HCC)     COVID-19     Diabetes mellitus without complication (HCC)     DVT (deep venous thrombosis) (HCC) 09/06/2020   Endometrial cancer (HCC)     GAVE (gastric antral vascular ectasia)     GERD  (gastroesophageal reflux disease)     High cholesterol     Hx of blood clots     Hyperlipidemia     Hypertension     IDA (iron deficiency anemia) 09/21/2020   Obesity     Pneumonia 06/11/2022    recovered   Pressure injury of skin 06/21/2021   Pulmonary embolism (HCC)     Sepsis (HCC)     Thrombocytopenia (HCC)     Urinary tract infection    Significant Hospital Events   8/6:Admitted to hospitalist service with urosepsis. Patient remained hypotensive despite IVF boluses therefore was started on Levophed.  PCCM consulted.  Consults:  Urology PCCM  Procedures:  NONE  Significant Diagnostic Tests:  8/5: Chest Xray>IMPRESSION: Linear opacity in the right mid lung field is favored to represent atelectasis   Interim History / Subjective:    -Currently not requiring pressor.  Micro Data:  8/5: SARS-CoV-2 PCR> negative 8/5: Influenza PCR> negative 8/5: Blood culture x2> 8/5: Urine Culture> 8/5: MRSA PCR>>   Antimicrobials:  Vancomycin 8/5 X 1 Ceftriaxone 8/5>  OBJECTIVE  Blood pressure (!) 98/41, pulse 74, temperature 98.4 F (36.9 C), temperature source Oral, resp. rate (!) 22, height 4\' 11"  (1.499 m), weight 86.2 kg, SpO2 95%.        Intake/Output Summary (Last 24 hours) at 02/11/2023 0353 Last data filed at 02/11/2023 0200 Gross per 24 hour  Intake 100 ml  Output 210 ml  Net -110 ml   Filed Weights   02/10/23 1139  Weight: 86.2 kg   Physical Examination  GENERAL:67 year-old critically ill patient lying in the bed in no acute distress EYES: PEERLA. No scleral icterus. Extraocular muscles intact.  HEENT: Head atraumatic, normocephalic. Oropharynx and nasopharynx clear.  NECK:  No JVD, supple  LUNGS: Normal breath sounds bilaterally.  No use of accessory muscles of respiration.  CARDIOVASCULAR: S1, S2 normal. No murmurs, rubs, or gallops.  ABDOMEN: Soft, NTND, left nephrostomy tube EXTREMITIES: No swelling or erythema.  Capillary refill > 3 seconds in all  extremities. Pulses palpable distally. NEUROLOGIC: The patient is alert and oriented x 4. No new or focal neurological deficit appreciated. Cranial nerves are intact.  SKIN: No obvious rash, lesion, or ulcer. Warm to touch Labs/imaging that I havepersonally reviewed  (right click and "Reselect all SmartList Selections" daily)     Labs   CBC: Recent Labs  Lab 02/10/23 1153  WBC 9.3  NEUTROABS 8.0*  HGB 9.8*  HCT 31.5*  MCV 87.3  PLT 189    Basic Metabolic Panel: Recent Labs  Lab 02/10/23 1153  NA 134*  K 3.5  CL 105  CO2 19*  GLUCOSE 158*  BUN 29*  CREATININE 1.39*  CALCIUM 8.6*   GFR: Estimated Creatinine Clearance: 36.9 mL/min (A) (by C-G formula based on SCr of 1.39 mg/dL (H)). Recent Labs  Lab 02/10/23 1152 02/10/23 1153 02/10/23 1522  WBC  --  9.3  --   LATICACIDVEN 2.6*  --  2.5*    Liver Function Tests: Recent Labs  Lab 02/10/23 1153  AST 26  ALT 17  ALKPHOS 64  BILITOT 2.1*  PROT 6.8  ALBUMIN 3.0*   No results for input(s): "LIPASE", "AMYLASE" in the last 168 hours. No results for input(s): "AMMONIA" in the last 168 hours.  ABG    Component Value Date/Time   HCO3 26.6 11/20/2022 2202   O2SAT 52 11/20/2022 2202     Coagulation Profile: No results for input(s): "INR", "PROTIME" in the last 168 hours.  Cardiac Enzymes: No results for input(s): "CKTOTAL", "CKMB", "CKMBINDEX", "TROPONINI" in the last 168 hours.  HbA1C: Hgb A1c MFr Bld  Date/Time Value Ref Range Status  11/16/2021 06:27 PM 5.7 (H) 4.8 - 5.6 % Final    Comment:    (NOTE) Pre diabetes:          5.7%-6.4%  Diabetes:              >6.4%  Glycemic control for   <7.0% adults with diabetes   10/17/2021 12:27 AM 5.3 4.8 - 5.6 % Final    Comment:    (NOTE) Pre diabetes:          5.7%-6.4%  Diabetes:              >6.4%  Glycemic control for   <7.0% adults with diabetes     CBG: No results for input(s): "GLUCAP" in the last 168 hours.  Review of Systems:    Constitutional:  Positive for chills, fever and malaise/fatigue. Negative for diaphoresis and weight loss.  HENT:  Positive for ear pain and hearing loss.   Eyes: Negative.   Cardiovascular:  Positive for orthopnea and leg swelling.  Gastrointestinal:  Positive for abdominal pain.  Genitourinary:  Positive for dysuria, flank pain, frequency and urgency.  Musculoskeletal:  Positive for back pain.  Skin: Negative.   Neurological: Negative.   Psychiatric/Behavioral: Negative.     Past Medical History  She,  has a past medical history of Acute bilateral deep vein thrombosis (DVT) of femoral veins (HCC) (01/02/2021), Acute massive  pulmonary embolism (HCC) (08/25/2020), Anemia, ARF (acute respiratory failure) (HCC), Cellulitis of left lower leg (06/20/2021), CHF (congestive heart failure) (HCC), COVID-19, Diabetes mellitus without complication (HCC), DVT (deep venous thrombosis) (HCC) (09/06/2020), Endometrial cancer (HCC), GAVE (gastric antral vascular ectasia), GERD (gastroesophageal reflux disease), High cholesterol, blood clots, Hyperlipidemia, Hypertension, IDA (iron deficiency anemia) (09/21/2020), Obesity, Pneumonia (06/11/2022), Pressure injury of skin (06/21/2021), Pulmonary embolism (HCC), Sepsis (HCC), Thrombocytopenia (HCC), and Urinary tract infection (09/13/2021).   Surgical History    Past Surgical History:  Procedure Laterality Date   CATARACT EXTRACTION W/PHACO Right 06/04/2022   Procedure: CATARACT EXTRACTION PHACO AND INTRAOCULAR LENS PLACEMENT (IOC) COMPLICATED RIGHT  31.83  02:17.4;  Surgeon: Galen Manila, MD;  Location: [facility];  Service: Ophthalmology;  Laterality: Right;   CATARACT EXTRACTION W/PHACO Left 07/09/2022   Procedure: CATARACT EXTRACTION PHACO AND INTRAOCULAR LENS PLACEMENT (IOC);  Surgeon: Galen Manila, MD;  Location: [facility];  Service: Ophthalmology;  Laterality: Left;  22.75 1:40.3   COLONOSCOPY N/A 06/21/2021   Procedure:  COLONOSCOPY;  Surgeon: Toledo, Boykin Nearing, MD;  Location: [facility] END[facility];  Service: Gastroenterology;  Laterality: N/A;   CYSTOSCOPY W/ URETERAL STENT PLACEMENT Left 09/11/2021   Procedure: CYSTOSCOPY WITH RETROGRADE PYELOGRAM/URETERAL STENT PLACEMENT;  Surgeon: Riki Altes, MD;  Location: [facility];  Service: Urology;  Laterality: Left;   CYSTOSCOPY W/ URETERAL STENT PLACEMENT Left 11/17/2021   Procedure: CYSTOSCOPY WITH STENT REPLACEMENT;  Surgeon: Bjorn Pippin, MD;  Location: [facility];  Service: Urology;  Laterality: Left;   CYSTOSCOPY WITH FULGERATION  11/17/2021   Procedure: CYSTOSCOPY WITH FULGERATION;  Surgeon: Bjorn Pippin, MD;  Location: [facility];  Service: Urology;;   CYSTOSCOPY WITH STENT PLACEMENT Left 10/17/2021   Procedure: CYSTOSCOPY WITH STENT PLACEMENT;  Surgeon: Riki Altes, MD;  Location: [facility];  Service: Urology;  Laterality: Left;   CYSTOSCOPY WITH URETEROSCOPY, STONE BASKETRY AND STENT PLACEMENT Left 10/16/2021   Procedure: CYSTOSCOPY WITH URETEROSCOPY  AND STENT REMOVAL;  Surgeon: Riki Altes, MD;  Location: [facility];  Service: Urology;  Laterality: Left;   ENTEROSCOPY N/A 08/17/2021   Procedure: ENTEROSCOPY;  Surgeon: Toney Reil, MD;  Location: [facility];  Service: Gastroenterology;  Laterality: N/A;   ESOPHAGOGASTRODUODENOSCOPY N/A 06/21/2021   Procedure: ESOPHAGOGASTRODUODENOSCOPY (EGD);  Surgeon: Toledo, Boykin Nearing, MD;  Location: [facility];  Service: Gastroenterology;  Laterality: N/A;   ESOPHAGOGASTRODUODENOSCOPY (EGD) WITH PROPOFOL N/A 08/17/2021   Procedure: ESOPHAGOGASTRODUODENOSCOPY (EGD) WITH PROPOFOL;  Surgeon: Toney Reil, MD;  Location: [facility] ENDOSCOPY;  Service: Gastroenterology;  Laterality: N/A;   ESOPHAGOGASTRODUODENOSCOPY (EGD) WITH PROPOFOL N/A 10/14/2021   Procedure: ESOPHAGOGASTRODUODENOSCOPY (EGD) WITH PROPOFOL;  Surgeon: Midge Minium, MD;  Location: [facility];  Service: Endoscopy;  Laterality: N/A;    ESOPHAGOGASTRODUODENOSCOPY (EGD) WITH PROPOFOL N/A 02/01/2022   Procedure: ESOPHAGOGASTRODUODENOSCOPY (EGD) WITH PROPOFOL;  Surgeon: Toney Reil, MD;  Location: [facility];  Service: Gastroenterology;  Laterality: N/A;   ESOPHAGOGASTRODUODENOSCOPY (EGD) WITH PROPOFOL N/A 03/15/2022   Procedure: ESOPHAGOGASTRODUODENOSCOPY (EGD) WITH PROPOFOL;  Surgeon: Wyline Mood, MD;  Location: [facility] ENDOSCOPY;  Service: Gastroenterology;  Laterality: N/A;   ESOPHAGOGASTRODUODENOSCOPY (EGD) WITH PROPOFOL N/A 04/18/2022   Procedure: ESOPHAGOGASTRODUODENOSCOPY (EGD) WITH PROPOFOL;  Surgeon: Toney Reil, MD;  Location: [facility] ENDOSCOPY;  Service: Gastroenterology;  Laterality: N/A;   ESOPHAGOGASTRODUODENOSCOPY (EGD) WITH PROPOFOL N/A 08/09/2022   Procedure: ESOPHAGOGASTRODUODENOSCOPY (EGD) WITH PROPOFOL;  Surgeon: Wyline Mood, MD;  Location: [facility] END[facility]COPY;  Service: Gastroenterology;  Laterality: N/A;   GIVENS CAPSULE STUDY N/A 06/22/2021   Procedure: GIVENS CAPSULE STUDY;  Surgeon: Toledo, Boykin Nearing, MD;  Location: [facility] END[facility];  Service: Gastroenterology;  Laterality: N/A;   IR NEPHROSTOMY EXCHANGE LEFT  02/25/2022   IR NEPHROSTOMY EXCHANGE LEFT  03/27/2022   IR NEPHROSTOMY EXCHANGE LEFT  05/22/2022   IR NEPHROSTOMY EXCHANGE LEFT  07/24/2022   IR NEPHROSTOMY EXCHANGE LEFT  09/24/2022   IR NEPHROSTOMY EXCHANGE LEFT  11/21/2022   IR NEPHROSTOMY PLACEMENT LEFT  12/27/2021   IVC FILTER INSERTION N/A 08/18/2020   Procedure: IVC FILTER INSERTION;  Surgeon: Renford Dills, MD;  Location: [facility];  Service: Cardiovascular;  Laterality: N/A;   PERIPHERAL VASCULAR THROMBECTOMY Bilateral 01/03/2021   Procedure: PERIPHERAL VASCULAR THROMBECTOMY;  Surgeon: Annice Needy, MD;  Location: [facility];  Service: Cardiovascular;  Laterality: Bilateral;   PULMONARY THROMBECTOMY N/A 08/18/2020   Procedure: PULMONARY THROMBECTOMY;  Surgeon: Renford Dills, MD;  Location: [facility];   Service: Cardiovascular;  Laterality: N/A;   TUBAL LIGATION     VISCERAL ANGIOGRAPHY N/A 07/18/2021   Procedure: VISCERAL ANGIOGRAPHY;  Surgeon: Annice Needy, MD;  Location: [facility];  Service: Cardiovascular;  Laterality: N/A;     Social History   reports that she has never smoked. She has never used smokeless tobacco. She reports that she does not currently use alcohol. She reports that she does not use drugs.   Family History   Her family history includes Breast cancer in her cousin; Congestive Heart Failure in her father; Diabetes in her father and mother; High Cholesterol in her father; Hypertension in her father and mother.   Allergies No Known Allergies   Home Medications  Prior to Admission medications   Medication Sig Start Date End Date Taking? Authorizing Provider  acetaminophen (TYLENOL) 500 MG tablet Take 500 mg by mouth every 6 (six) hours as needed for mild pain or fever.   Yes [provider]  albuterol (VENTOLIN HFA) 108 (90 Base) MCG/ACT inhaler Inhale 2 puffs into the lungs every 6 (six) hours as needed for wheezing or shortness of breath. 10/26/22  Yes Marrion Coy, MD  Ascorbic Acid (VITAMIN C) 1000 MG tablet Take 1,000 mg by mouth daily.   Yes [provider]  FEROSUL 325 (65 Fe) MG tablet Take 325 mg by mouth daily with breakfast. 08/14/22  Yes [provider]  fluticasone furoate-vilanterol (BREO ELLIPTA) 100-25 MCG/ACT AEPB Inhale 1 puff into the lungs daily.   Yes [provider]  furosemide (LASIX) 20 MG tablet Take 1 tablet (20 mg total) by mouth daily. And additional 20mg  if needed 01/21/23  Yes Clarisa Kindred A, FNP  loperamide (IMODIUM) 2 MG capsule Take 2 mg by mouth as needed for diarrhea or loose stools. 09/27/22  Yes [provider]  Multiple Vitamin (MULTIVITAMIN WITH MINERALS) TABS tablet Take 1 tablet by mouth daily. 09/12/20  Yes Standley Brooking, MD  nystatin (MYCOSTATIN/NYSTOP) powder Apply 1  Application topically 4 (four) times daily as needed. 02/03/23  Yes [provider]  pantoprazole (PROTONIX) 40 MG tablet Take 1 tablet (40 mg total) by mouth 2 (two) times daily. 03/01/22 08/01/23 Yes Sreenath, Sudheer B, MD  rosuvastatin (CRESTOR) 10 MG tablet Take 1 tablet (10 mg total) by mouth daily. 01/21/23  Yes Clarisa Kindred A, FNP  triamcinolone cream (KENALOG) 0.1 % Apply 1 Application topically 2 (two) times daily as needed. 12/04/22  Yes [provider]  Scheduled Meds:  vitamin C  1,000 mg Oral Daily   Chlorhexidine Gluconate Cloth  6 each Topical Daily   enoxaparin (LOVENOX) injection  40 mg Subcutaneous Q24H   fluticasone furoate-vilanterol  1 puff Inhalation Daily   multivitamin with minerals  1 tablet Oral Daily   pantoprazole  40 mg Oral BID   rosuvastatin  10 mg Oral Daily   Continuous Infusions:  sodium chloride 150 mL/hr at 02/10/23 2345   cefTRIAXone (ROCEPHIN)  IV     PRN Meds:.acetaminophen **OR** acetaminophen, albuterol, hydrALAZINE, loperamide, nystatin, ondansetron **OR** ondansetron (ZOFRAN) IV, mouth rinse, senna-docusate   Active Hospital Problem list   SEE BELOW  Assessment & Plan:  #Sepsis Secondary to UTI Hx of Recurrent UTI's and Left Ureteral Obstruction S/p cystoscopy with left ureteral stent placement 09/11/2021 and Ureteroscopy with stent removal 10/16/21 S/p cystoscopy with left ureteral stent  placement 10/17/21 -F/u  blood and Urine cultures, trend lactic/ PCT -Monitor WBC/ fever curve -Continue Ceftriaxone -Gentle IVF hydration as needed -Repeat blood cultures until clear -Pressors for MAP goal >65 -Strict I/O's -Urology consult, appears there is a planned IR Left Nephrostomy tube exchange today @11 :30   #Chronic Diastolic CHF (last known EF 55%) -Hypertension Hx: HLD  -Does not appear volume overloaded, BNP pending -Continue Rosuvastatin once able to take po -Lasix as blood pressure and renal function permits; currently  on holding  in the setting of hypotension and AKI   #AKI -Monitor I&O's / urinary output -Follow BMP -Ensure adequate renal perfusion -Avoid nephrotoxic agents as able -Replace electrolytes as indicated   #Iron deficiency anemia due to Chronic GI Bleed  Prior recurrent GI bleed with positive bleeding scan 1/11 at level of splenic flexure s/p embolization with Vascular surgery -Hgb on admission 9.8 -Follow CBC -No s/s of bleeding -Threshold to Transfuse PRN Hgb<7   #History of DVT, PE Likely exacerbated by immobility, lymphedema, prior history of DVT and PE -s/p thrombectomy (2022) and IVC Filter 6/22 -Xarelto discontinued in the setting of GI Bleed?  Hyperglycemia on admission Hx of Diabetes mellitus? Last hgb A1c? Check hemoglobin A1c -CBGs -Sliding scale insulin -Follow ICU hyper/hypoglycemia protocol  Best practice:  Diet:  NPO Pain/Anxiety/Delirium protocol (if indicated): No VAP protocol (if indicated): Not indicated DVT prophylaxis: LMWH GI prophylaxis: PPI Glucose control:  SSI Yes Central venous access:  N/A Arterial line:  N/A Foley:  N/A Mobility:  bed rest  PT consulted: N/A Last date of multidisciplinary goals of care discussion [02/10/23] Code Status:  full code Disposition: ICU   = Goals of Care = Code Status Order: FULL  Primary Emergency Contact: Warnell Bureau Wishes to pursue full aggressive treatment and intervention options, including CPR and intubation, but goals of care will be addressed on going with family if that should become necessary.  Critical care time: 45 minutes        Webb Silversmith DNP, CCRN, FNP-C, AGACNP-BC Acute Care & Family Nurse Practitioner [facility] PCCM on call pager [phone]

## 2023-02-11 NOTE — Progress Notes (Addendum)
Progress Note   Patient: Courtney Mack ZOX:[id number] DOB: 06/27/1956 DOA: 02/10/2023     1 DOS: the patient was seen and examined on 02/11/2023   Brief hospital course: [name] is a 67 year old female with history of left-sided hydronephrosis status post nephrostomy tube placement, GERD, hyperlipidemia, hypertension, COPD, history of extensive bilateral PE status post embolectomy and IVC placement, history of DVT, edema, chronic anemia, thrombocytopenia, tobacco use, history of heart failure preserved ejection fraction, who presents to the emergency department for chief concerns of fever, shortness of breath.  Vitals in the ED showed temperature of 98.6, respiration rate of 24, heart rate of 86, blood pressure initially 62/43, improved to 98/72, SpO2 of 95% on room air.  Serum sodium is 134, potassium 2.5, chloride 105, bicarb 19, BUN of 29, serum creatinine of 1.39, EGFR 41, nonfasting blood glucose 158, WBC 9.3, hemoglobin 9.8, platelets of 189.  Lactic acid is 2.6.  UA was positive for moderate leukocytes.  ED treatment: Ceftriaxone 1 g IV one-time dose, vancomycin 1 g IV, LR 1 L bolus x 2 were given.  8/6: Afebrile this morning, maximum temperature recorded 103 over the past 24-hour.  Overnight persistent hypertension, some improvement with MAP of 62-64 now.  Lactic acidosis resolved.  Hemoglobin decreased to 8, baseline appears to be around 8-8.9.  All cell lines decreased so some dilutional effect, 9.8 reading done yesterday was most likely concentrated.  Potassium 3.4, creatinine with some improvement to 1.11.  Preliminary blood cultures negative, urine cultures with Courtney Mack-pending susceptibility. Urology was consulted as patient was due for nephrostomy tube exchange, they will see her today and likely will do nephrostomy tube exchange.  Assessment and Plan: * Severe sepsis with acute organ dysfunction (HCC) Concern of septic shock due to incomplete response of  fluid boluses. Blood pressure remains soft-never required pressors but did receive a dose of midodrine.  Concern of sepsis secondary to UTI as urine cultures growing Courtney-pending susceptibility, preliminary blood cultures negative.  Lactic acidosis resolved. -Continue with ceftriaxone-follow-up susceptibility results -Start her on low-dose midodrine 5 mg 3 times daily -Continue to monitor  Hypotension Likely due to sepsis.  Received IV fluid. Blood pressure still soft, MAP of 57 -Start her on midodrine -Continue to monitor  Chronic diastolic CHF (congestive heart failure) (HCC) Not in acute exacerbation -Monitor volume status closely as patient is getting IV fluid  Nephrostomy status (HCC) Patient has left-sided nephrostomy tube in place which was supposed to be exchanged today as outpatient. Urology was consulted and they will perform the exchange today while patient is being admitted. -Follow-up urology recommendations  Recurrent deep vein thrombosis (DVT) (HCC) Patient had massive PE in February 2022 status post thrombectomy Patient had bilateral DVT in June 2022, patient is status post IVC filter  GERD without esophagitis Home PPI resumed  AKI (acute kidney injury) (HCC) Likely secondary to sepsis.  Creatinine improving, currently at 1.11 with baseline less than 1. -Monitor renal function -Avoid nephrotoxins  IDA (iron deficiency anemia) -Continue home iron supplement  COPD (chronic obstructive pulmonary disease) (HCC) Not in acute exacerbation  Gastric antral vascular ectasia Home PPI resumed  Dyslipidemia Home rosuvastatin 10 mg daily resumed      Subjective: Patient was seen and examined today.  Denies any pain or shortness of breath.  No dizziness while lying down.  Physical Exam: Vitals:   02/11/23 1000 02/11/23 1100 02/11/23 1200 02/11/23 1300  BP: (!) 107/44 (!) 107/48 (!) 94/43 (!) 113/52  Pulse: 69 68  74 70  Resp: (!) 22 (!) 29 (!) 22 (!) 22   Temp:   99 F (37.2 C)   TempSrc:      SpO2: 96% 96% (!) 89% 96%  Weight:      Height:       General.  Obese lady, in no acute distress. Pulmonary.  Lungs clear bilaterally, normal respiratory effort. CV.  Regular rate and rhythm, no JVD, rub or murmur. Abdomen.  Soft, nontender, nondistended, BS positive.  Left nephrostomy tube in place. CNS.  Alert and oriented .  No focal neurologic deficit. Extremities.  No edema, no cyanosis, pulses intact and symmetrical. Psychiatry.  Judgment and insight appears normal.   Data Reviewed: Prior data reviewed  Family Communication: Talked with daughter on phone.  Disposition: Status is: Inpatient Remains inpatient appropriate because: Severity of illness  Planned Discharge Destination: Home  DVT prophylaxis.  Lovenox Time spent: 50 minutes  This record has been created using Conservation officer, historic buildings. Errors have been sought and corrected,but may not always be located. Such creation errors do not reflect on the standard of care.   Author: Arnetha Courser, MD 02/11/2023 2:07 PM  For on call review [email or web address].

## 2023-02-11 NOTE — Progress Notes (Addendum)
2345 After patient arrived to ICU room Webb Silversmith NP notified about patients Low BP. NP told nurse that the patient's BP runs low at baseline. That she is okay if the patients Systolic at least 90 or the map is at least 65. Patient Alert and oriented x 4. Patient asymptomatic .   0215 Patients BP fluctuating Systolic in the 80s with maps in the 50s. NP Ouma made aware and she's I fine with these BPS while the patient is sleeping. Patient alert and oriented when woken up.

## 2023-02-12 DIAGNOSIS — E861 Hypovolemia: Secondary | ICD-10-CM | POA: Diagnosis not present

## 2023-02-12 DIAGNOSIS — I5032 Chronic diastolic (congestive) heart failure: Secondary | ICD-10-CM | POA: Diagnosis not present

## 2023-02-12 DIAGNOSIS — N390 Urinary tract infection, site not specified: Secondary | ICD-10-CM

## 2023-02-12 DIAGNOSIS — A419 Sepsis, unspecified organism: Secondary | ICD-10-CM | POA: Diagnosis not present

## 2023-02-12 DIAGNOSIS — R319 Hematuria, unspecified: Secondary | ICD-10-CM

## 2023-02-12 DIAGNOSIS — N179 Acute kidney failure, unspecified: Secondary | ICD-10-CM

## 2023-02-12 MED ORDER — SULFAMETHOXAZOLE-TRIMETHOPRIM 800-160 MG PO TABS
1.0000 | ORAL_TABLET | Freq: Two times a day (BID) | ORAL | Status: DC
  Administered 2023-02-12: 1 via ORAL
  Filled 2023-02-12: qty 1

## 2023-02-12 MED ORDER — PANTOPRAZOLE SODIUM 40 MG PO TBEC: 40.0000 mg | DELAYED_RELEASE_TABLET | Freq: Two times a day (BID) | ORAL | 1 refills | Status: DC

## 2023-02-12 MED ORDER — MIDODRINE HCL 5 MG PO TABS: 5.0000 mg | ORAL_TABLET | Freq: Three times a day (TID) | ORAL | 1 refills | Status: DC

## 2023-02-12 MED ORDER — SULFAMETHOXAZOLE-TRIMETHOPRIM 800-160 MG PO TABS: 1.0000 | ORAL_TABLET | Freq: Two times a day (BID) | ORAL | 0 refills | Status: AC

## 2023-02-12 NOTE — TOC Initial Note (Signed)
Transition of Care [facility]) - Initial/Assessment Note    Patient Details  Name: Courtney Mack MRN: [id number] Date of Birth: 10/06/1955  Transition of Care [facility]) CM/SW Contact:    Kreg Shropshire, RN Phone Number: 02/12/2023, 1:33 PM  Clinical Narrative:                 Pt arrived from ED from: home Caregiver Support: Lives with Daughter DME at Home: Systems analyst: Daughter provides transportation Previous Services: none HH/SNF Preference: declined First Person of Contact: Daughter PCP: [facility]  Md recommended [facility] PT. Cm asked about Home Health PT. Pt declined at this time. She stated she has all the equipment she needs for d/c. Cm informed MD.   Cm will continue to follow for toc needs and d/c planning.     Barriers to Discharge: Continued Medical Work up   Patient Goals and CMS Choice   CMS Medicare.gov Compare Post Acute Care list provided to:: Patient Choice offered to / list presented to : Patient      Expected Discharge Plan and Services     Post Acute Care Choice: Home Health Living arrangements for the past 2 months: Single Family Home                                      Prior Living Arrangements/Services Living arrangements for the past 2 months: Single Family Home Lives with:: Self, Adult Children              Current home services: DME    Activities of Daily Living Home Assistive Devices/Equipment: Eyeglasses, Environmental consultant (specify type) ADL Screening (condition at time of admission) Patient's cognitive ability adequate to safely complete daily activities?: Yes Is the patient deaf or have difficulty hearing?: No Does the patient have difficulty seeing, even when wearing glasses/contacts?: No Does the patient have difficulty concentrating, remembering, or making decisions?: No Patient able to express need for assistance with ADLs?: Yes Does the patient have difficulty dressing or bathing?: Yes Independently performs ADLs?:  No Communication: Independent Dressing (OT): Needs assistance Is this a change from baseline?: Pre-admission baseline Grooming: Needs assistance Is this a change from baseline?: Pre-admission baseline Feeding: Independent Bathing: Needs assistance Is this a change from baseline?: Pre-admission baseline Toileting: Needs assistance Is this a change from baseline?: Pre-admission baseline In/Out Bed: Needs assistance Is this a change from baseline?: Pre-admission baseline Walks in Home: Independent with device (comment) Does the patient have difficulty walking or climbing stairs?: Yes Weakness of Legs: Both Weakness of Arms/Hands: Both  Permission Sought/Granted                  Emotional Assessment       Orientation: : Oriented to Self, Oriented to Place, Oriented to  Time, Oriented to Situation      Admission diagnosis:  Severe sepsis with acute organ dysfunction (HCC) [A41.9, R65.20] Urinary tract infection with hematuria, site unspecified [N39.0, R31.9] Patient Active Problem List   Diagnosis Date Noted   Pressure injury of skin 02/11/2023   Severe sepsis with acute organ dysfunction (HCC) 02/10/2023   Sepsis due to gram-negative UTI (HCC) 11/21/2022   Acute pyelonephritis 11/21/2022   Dyspnea 11/21/2022   Chest pain 11/21/2022   Hypokalemia 10/25/2022   COPD exacerbation (HCC) 10/24/2022   Iron deficiency anemia 10/24/2022   Symptomatic anemia 08/08/2022   COPD (chronic obstructive pulmonary disease) (HCC) 08/07/2022  Sepsis due to pneumonia (HCC) 06/12/2022   Multifocal pneumonia 06/12/2022   GERD without esophagitis 06/12/2022   Generalized weakness 06/12/2022   Nephrostomy status (HCC) 06/12/2022   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) 04/16/2022   GI bleed 03/14/2022   Acute on chronic anemia 02/24/2022   Hypotension 02/24/2022   Acute cystitis with hematuria    Lymphedema 02/08/2022   Hematuria 12/24/2021   History of  pulmonary embolus (PE) 12/24/2021   Chronic radiation cystitis 11/17/2021   Hydronephrosis    Gastric antral vascular ectasia    UTI (urinary tract infection) 09/13/2021   AKI (acute kidney injury) (HCC) 09/13/2021   Melena 07/18/2021   Acute on chronic blood loss anemia 07/18/2021   HLD (hyperlipidemia) 06/20/2021   Chronic diastolic CHF (congestive heart failure) (HCC) 06/20/2021   Risk factors for obstructive sleep apnea 11/10/2020   IDA (iron deficiency anemia) 09/21/2020   Endometrial cancer (HCC) 09/13/2020   Recurrent deep vein thrombosis (DVT) (HCC) 09/06/2020   Morbidly obese (HCC) 08/23/2020   Dyslipidemia 08/23/2020   PCP:  [facility] Pharmacy:   [id number] [place], [place] - [address] AT [facility] [address] [place] [address] [place] [address] Phone: [phone] Fax: [phone]     Social Determinants of Health (SDOH) Social History: SDOH Screenings   Food Insecurity: No Food Insecurity (02/11/2023)  Housing: Low Risk  (02/11/2023)  Transportation Needs: No Transportation Needs (02/11/2023)  Utilities: Not At Risk (02/11/2023)  Depression (PHQ2-9): Low Risk  (04/11/2022)  Tobacco Use: Low Risk  (02/10/2023)   SDOH Interventions:     Readmission Risk Interventions    02/12/2023    1:32 PM 11/21/2022   12:18 PM 06/12/2022   11:45 AM  Readmission Risk Prevention Plan  Transportation Screening Complete Complete Complete  Medication Review Oceanographer) Complete Complete Complete  PCP or Specialist appointment within 3-5 days of discharge Complete Complete Complete  HRI or Home Care Consult Complete  Complete  SW Recovery Care/Counseling Consult Complete Complete Complete  Palliative Care Screening Not Applicable Not Applicable Not Applicable  Skilled Nursing Facility Not Applicable Not Applicable Not Applicable

## 2023-02-12 NOTE — Assessment & Plan Note (Signed)
Not in acute exacerbation.

## 2023-02-12 NOTE — Progress Notes (Signed)
PT Cancellation Note  Patient Details Name: Courtney Mack MRN: [id number] DOB: June 27, 1956   Cancelled Treatment:    Reason Eval/Treat Not Completed: Other (comment). Per review, pt and family decline PT services at this time. Confirmed with MD. Will sign off as no needs at this time.   , 02/12/2023, 1:40 PM Elizabeth Palau, PT, DPT, GCS [phone]

## 2023-02-12 NOTE — Discharge Summary (Signed)
Physician Discharge Summary   Patient: Courtney Mack MRN: [id number] DOB: 04-10-1956  Admit date:     02/10/2023  Discharge date: 02/12/23  Discharge Physician: Arnetha Courser   PCP: [facility]   Recommendations at discharge:  Please obtain CBC and BMP in 1 week Please ensure the completion of antibiotics-patient is being discharged on 7 days of Bactrim Follow-up with urology for nephrostomy tube exchange Follow-up with primary care provider  Discharge Diagnoses: Principal Problem:   Severe sepsis with acute organ dysfunction [facility]) Active Problems:   Hypotension   Chronic diastolic CHF (congestive heart failure) (HCC)   Nephrostomy status (HCC)   Recurrent deep vein thrombosis (DVT) (HCC)   IDA (iron deficiency anemia)   AKI (acute kidney injury) (HCC)   GERD without esophagitis   COPD (chronic obstructive pulmonary disease) (HCC)   Gastric antral vascular ectasia   Dyslipidemia   Pressure injury of skin   Hospital Course: [name] is a 67 year old female with history of left-sided hydronephrosis status post nephrostomy tube placement, GERD, hyperlipidemia, hypertension, COPD, history of extensive bilateral PE status post embolectomy and IVC placement, history of DVT, edema, chronic anemia, thrombocytopenia, tobacco use, history of heart failure preserved ejection fraction, who presents to the emergency department for chief concerns of fever, shortness of breath.  Vitals in the ED showed temperature of 98.6, respiration rate of 24, heart rate of 86, blood pressure initially 62/43, improved to 98/72, SpO2 of 95% on room air.  Serum sodium is 134, potassium 2.5, chloride 105, bicarb 19, BUN of 29, serum creatinine of 1.39, EGFR 41, nonfasting blood glucose 158, WBC 9.3, hemoglobin 9.8, platelets of 189.  Lactic acid is 2.6.  UA was positive for moderate leukocytes.  ED treatment: Ceftriaxone 1 g IV one-time dose, vancomycin 1 g IV, LR 1 L  bolus x 2 were given.  8/6: Afebrile this morning, maximum temperature recorded 103 over the past 24-hour.  Overnight persistent hypertension, some improvement with MAP of 62-64 now.  Lactic acidosis resolved.  Hemoglobin decreased to 8, baseline appears to be around 8-8.9.  All cell lines decreased so some dilutional effect, 9.8 reading done yesterday was most likely concentrated.  Potassium 3.4, creatinine with some improvement to 1.11.  Preliminary blood cultures negative, urine cultures with Proteus Courtney Mack-pending susceptibility. Urology was consulted as patient was due for nephrostomy tube exchange, they will see her today and likely will do nephrostomy tube exchange.  8/7: Patient remained hemodynamically stable.  Feeling at baseline, eating and drinking.  Does not want any home health services.  Urine culture susceptibility back with resistance only to ampicillin and nitrofurantoin.  Patient is being started on Bactrim for 7 more days. Discussed with urology and now they are recommending tube exchange as outpatient.  Patient is being discharged on Bactrim for 7 days and midodrine 5 mg 3 times daily.  She was instructed to stop taking midodrine if blood pressure remained persistently above 130 systolic. Discussed with daughter and patient both.  Patient will continue on her current medications and need to have a close follow-up with her providers for further management.  Assessment and Plan: * Severe sepsis with acute organ dysfunction (HCC) Concern of septic shock due to incomplete response of fluid boluses. Blood pressure remains soft-never required pressors but did receive a dose of midodrine.  Concern of sepsis secondary to UTI as urine cultures growing Proteus-, preliminary blood cultures negative.  Lactic acidosis resolved. History of chronic soft blood pressure. -Switching ceftriaxone  with Bactrim for 1 more week -Continue midodrine 5 mg 3 times daily -Continue to  monitor  Hypotension Likely due to sepsis.  Received IV fluid. History of softer blood pressure chronically. Feeling much improved and appears to be at baseline now -Continue with midodrine -Continue to monitor  Chronic diastolic CHF (congestive heart failure) (HCC) Not in acute exacerbation   Nephrostomy status (HCC) Patient has left-sided nephrostomy tube in place which was supposed to be exchanged today as outpatient. Urology was consulted and they will perform the exchange as outpatient  Recurrent deep vein thrombosis (DVT) [facility]) Patient had massive PE in February 2022 status post thrombectomy Patient had bilateral DVT in June 2022, patient is status post IVC filter  GERD without esophagitis Home PPI resumed  AKI (acute kidney injury) (HCC) Resolved. Likely secondary to sepsis.   -Monitor renal function -Avoid nephrotoxins  IDA (iron deficiency anemia) -Continue home iron supplement  COPD (chronic obstructive pulmonary disease) (HCC) Not in acute exacerbation  Gastric antral vascular ectasia Home PPI resumed  Dyslipidemia Home rosuvastatin 10 mg daily resumed       Consultants: Urology Procedures performed: None Disposition: Home Diet recommendation:  Discharge Diet Orders (From admission, onward)     Start     Ordered   02/12/23 0000  Diet - low sodium heart healthy        02/12/23 1340           Regular diet DISCHARGE MEDICATION: Allergies as of 02/12/2023   No Known Allergies      Medication List     TAKE these medications    acetaminophen 500 MG tablet Commonly known as: TYLENOL Take 500 mg by mouth every 6 (six) hours as needed for mild pain or fever.   albuterol 108 (90 Base) MCG/ACT inhaler Commonly known as: VENTOLIN HFA Inhale 2 puffs into the lungs every 6 (six) hours as needed for wheezing or shortness of breath.   FeroSul 325 (65 Fe) MG tablet Generic drug: ferrous sulfate Take 325 mg by mouth daily with breakfast.    fluticasone furoate-vilanterol 100-25 MCG/ACT Aepb Commonly known as: BREO ELLIPTA Inhale 1 puff into the lungs daily.   furosemide 20 MG tablet Commonly known as: LASIX Take 1 tablet (20 mg total) by mouth daily. And additional 20mg  if needed   loperamide 2 MG capsule Commonly known as: IMODIUM Take 2 mg by mouth as needed for diarrhea or loose stools.   midodrine 5 MG tablet Commonly known as: PROAMATINE Take 1 tablet (5 mg total) by mouth 3 (three) times daily with meals.   multivitamin with minerals Tabs tablet Take 1 tablet by mouth daily.   nystatin powder Commonly known as: MYCOSTATIN/NYSTOP Apply 1 Application topically 4 (four) times daily as needed.   pantoprazole 40 MG tablet Commonly known as: PROTONIX Take 1 tablet (40 mg total) by mouth 2 (two) times daily.   rosuvastatin 10 MG tablet Commonly known as: CRESTOR Take 1 tablet (10 mg total) by mouth daily.   sulfamethoxazole-trimethoprim 800-160 MG tablet Commonly known as: BACTRIM DS Take 1 tablet by mouth every 12 (twelve) hours for 7 days.   triamcinolone cream 0.1 % Commonly known as: KENALOG Apply 1 Application topically 2 (two) times daily as needed.   vitamin C 1000 MG tablet Take 1,000 mg by mouth daily.               Discharge Care Instructions  (From admission, onward)  Start     Ordered   02/12/23 0000  No dressing needed        02/12/23 1340            Follow-up Information     [facility]. Schedule an appointment as soon as possible for a visit in 1 week(s).   Specialty: General Practice Contact information: [address] [place] [address] [phone]         Riki Altes, MD. Schedule an appointment as soon as possible for a visit.   Specialty: Urology Contact information: [address] [place] [address] [phone]                Discharge Exam: Ceasar Mons Weights    02/10/23 1139  Weight: 86.2 kg   General.  Frail lady, in no acute distress. Pulmonary.  Lungs clear bilaterally, normal respiratory effort. CV.  Regular rate and rhythm, no JVD, rub or murmur. Abdomen.  Soft, nontender, nondistended, BS positive. CNS.  Alert and oriented .  No focal neurologic deficit.  Left nephrostomy tube in place Extremities.  No edema, no cyanosis, pulses intact and symmetrical. Psychiatry.  Judgment and insight appears normal.   Condition at discharge: stable  The results of significant diagnostics from this hospitalization (including imaging, microbiology, ancillary and laboratory) are listed below for reference.   Imaging Studies: DG Chest Port 1 View  Result Date: 02/10/2023 CLINICAL DATA:  Sepsis EXAM: PORTABLE CHEST 1 VIEW COMPARISON:  CXR 12/24/22 FINDINGS: No pleural effusion. No pneumothorax. Linear opacity in the right lung field is favored to represent atelectasis. Low lung volumes. Normal cardiac and mediastinal contours. No radiographically apparent displaced rib fractures. Visualized upper abdomen is unremarkable. IMPRESSION: Linear opacity in the right mid lung field is favored to represent atelectasis. Electronically Signed   By: Lorenza Cambridge M.D.   On: 02/10/2023 12:38    Microbiology: Results for orders placed or performed during the hospital encounter of 02/10/23  Blood Culture (routine x 2)     Status: None (Preliminary result)   Collection Time: 02/10/23 11:52 AM   Specimen: BLOOD  Result Value Ref Range Status   Specimen Description BLOOD BLOOD LEFT FOREARM  Final   Special Requests   Final    BOTTLES DRAWN AEROBIC AND ANAEROBIC Blood Culture adequate volume   Culture   Final    NO GROWTH 2 DAYS Performed at [facility], [address], [place], [place] [address]    Report Status PENDING  Incomplete  Urine Culture     Status: Abnormal   Collection Time: 02/10/23 11:52 AM   Specimen: Urine, Random  Result Value Ref Range  Status   Specimen Description   Final    URINE, RANDOM Performed at [facility], [address] [place], [place] [address]    Special Requests   Final    NONE Reflexed from [id number] Performed at [facility], [address] [place], [place] [address]    Culture >=100,000 COLONIES/mL PROTEUS PENNERI (A)  Final   Report Status 02/12/2023 FINAL  Final   Organism ID, Bacteria PROTEUS PENNERI (A)  Final      Susceptibility   Proteus penneri - MIC*    AMPICILLIN >=32 RESISTANT Resistant     CEFEPIME <=0.12 SENSITIVE Sensitive     CEFTRIAXONE <=0.25 SENSITIVE Sensitive     CIPROFLOXACIN <=0.25 SENSITIVE Sensitive     GENTAMICIN <=1 SENSITIVE Sensitive     IMIPENEM  2 SENSITIVE Sensitive     NITROFURANTOIN 128 RESISTANT Resistant     TRIMETH/SULFA <=20 SENSITIVE Sensitive     AMPICILLIN/SULBACTAM 4 SENSITIVE Sensitive     PIP/TAZO <=4 SENSITIVE Sensitive     * >=100,000 COLONIES/mL PROTEUS PENNERI  Blood Culture (routine x 2)     Status: None (Preliminary result)   Collection Time: 02/10/23 11:54 AM   Specimen: BLOOD  Result Value Ref Range Status   Specimen Description BLOOD BLOOD LEFT HAND  Final   Special Requests   Final    BOTTLES DRAWN AEROBIC AND ANAEROBIC Blood Culture adequate volume   Culture   Final    NO GROWTH 2 DAYS Performed at [facility], [address], [place], [place] [address]    Report Status PENDING  Incomplete  SARS Coronavirus 2 by RT PCR (hospital order, performed in [facility] hospital lab) *cepheid single result test* Urine, Clean Catch     Status: None   Collection Time: 02/10/23  4:22 PM   Specimen: Urine, Clean Catch; Nasal Swab  Result Value Ref Range Status   SARS Coronavirus 2 by RT PCR NEGATIVE NEGATIVE Final    Comment: (NOTE) SARS-CoV-2 target nucleic acids are NOT DETECTED.  The SARS-CoV-2 RNA is generally detectable in upper and lower respiratory specimens during the acute phase of infection. The  lowest concentration of SARS-CoV-2 viral copies this assay can detect is 250 copies / mL. A negative result does not preclude SARS-CoV-2 infection and should not be used as the sole basis for treatment or other patient management decisions.  A negative result may occur with improper specimen collection / handling, submission of specimen other than nasopharyngeal swab, presence of viral mutation(s) within the areas targeted by this assay, and inadequate number of viral copies (<250 copies / mL). A negative result must be combined with clinical observations, patient history, and epidemiological information.  Fact Sheet for Patients:   RoadLapTop.co.za  Fact Sheet for Healthcare Providers: http://kim-miller.com/  This test is not yet approved or  cleared by the [place] FDA and has been authorized for detection and/or diagnosis of SARS-CoV-2 by FDA under an Emergency Use Authorization (EUA).  This EUA will remain in effect (meaning this test can be used) for the duration of the COVID-19 declaration under Section 564(b)(1) of the Act, 21 U.S.C. section 360bbb-3(b)(1), unless the authorization is terminated or revoked sooner.  Performed at [facility], [address] [place], [place] [address]   MRSA culture     Status: None   Collection Time: 02/10/23 11:38 PM   Specimen: Nasal; Body Fluid  Result Value Ref Range Status   Specimen Description   Final    NASAL SWAB Performed at [facility], [address] [place], [place] [address]    Special Requests   Final    NONE Performed at [facility], [address] [place], [place] [address]    Culture   Final    NO STAPHYLOCOCCUS AUREUS ISOLATED Performed at [facility], [address] [place], [place] [address]    Report Status 02/11/2023 FINAL  Final    Labs: CBC: Recent Labs  Lab 02/10/23 1153 02/11/23 0503 02/12/23 0324  WBC 9.3  5.5 4.4  NEUTROABS 8.0*  --   --   HGB 9.8* 8.0* 8.1*  HCT 31.5* 25.2* 25.3*  MCV 87.3 86.3 86.1  PLT 189 151 153   Basic Metabolic Panel: Recent Labs  Lab 02/10/23 1153 02/11/23 0503 02/12/23  0324  NA 134* 135 138  K 3.5 3.4* 3.5  CL 105 108 113*  CO2 19* 22 21*  GLUCOSE 158* 98 84  BUN 29* 28* 21  CREATININE 1.39* 1.11* 0.98  CALCIUM 8.6* 7.7* 7.7*   Liver Function Tests: Recent Labs  Lab 02/10/23 1153  AST 26  ALT 17  ALKPHOS 64  BILITOT 2.1*  PROT 6.8  ALBUMIN 3.0*   CBG: Recent Labs  Lab 02/10/23 2317  GLUCAP 93    Discharge time spent: greater than 30 minutes.  This record has been created using Conservation officer, historic buildings. Errors have been sought and corrected,but may not always be located. Such creation errors do not reflect on the standard of care.   Signed: Arnetha Courser, MD [facility] 02/12/2023

## 2023-02-12 NOTE — Assessment & Plan Note (Signed)
Likely due to sepsis.  Received IV fluid. History of softer blood pressure chronically. Feeling much improved and appears to be at baseline now -Continue with midodrine -Continue to monitor

## 2023-02-12 NOTE — Assessment & Plan Note (Signed)
Patient has left-sided nephrostomy tube in place which was supposed to be exchanged today as outpatient. Urology was consulted and they will perform the exchange as outpatient

## 2023-02-12 NOTE — Assessment & Plan Note (Signed)
Concern of septic shock due to incomplete response of fluid boluses. Blood pressure remains soft-never required pressors but did receive a dose of midodrine.  Concern of sepsis secondary to UTI as urine cultures growing Proteus-, preliminary blood cultures negative.  Lactic acidosis resolved. History of chronic soft blood pressure. -Switching ceftriaxone with Bactrim for 1 more week -Continue midodrine 5 mg 3 times daily -Continue to monitor

## 2023-02-14 NOTE — Progress Notes (Signed)
Patient for IR Neph Tube Exchange on Monday 02/17/2023, I called and spoke with the patient on the phone and gave pre-procedure instructions. Pt was made aware to be here at 2p. Pt stated understanding.  Called 02/14/2023

## 2023-02-17 ENCOUNTER — Ambulatory Visit
Admission: RE | Admit: 2023-02-17 | Discharge: 2023-02-17 | Disposition: A | Discharge status: Home / Self Care | Payer: Medicare HMO | Source: Ambulatory Visit | Attending: Interventional Radiology | Admitting: Interventional Radiology

## 2023-02-17 ENCOUNTER — Other Ambulatory Visit: Payer: Self-pay | Admitting: Interventional Radiology

## 2023-02-17 DIAGNOSIS — N1330 Unspecified hydronephrosis: Secondary | ICD-10-CM

## 2023-02-17 DIAGNOSIS — Z436 Encounter for attention to other artificial openings of urinary tract: Secondary | ICD-10-CM | POA: Diagnosis not present

## 2023-02-17 HISTORY — PX: IR NEPHROSTOMY EXCHANGE LEFT: IMG6069

## 2023-02-17 MED ORDER — IOHEXOL 300 MG/ML  SOLN
15.0000 mL | Freq: Once | INTRAMUSCULAR | Status: AC | PRN
  Administered 2023-02-17: 15 mL

## 2023-02-17 MED ORDER — LIDOCAINE HCL 1 % IJ SOLN
INTRAMUSCULAR | Status: AC
  Filled 2023-02-17: qty 20

## 2023-02-26 NOTE — Progress Notes (Deleted)
02/27/2023 10:29 AM   Courtney Mack 18-Jun-1956 [id number]  Referring provider: [facility] [address] [place],  [place] [address]  Urological history: 1. High risk hematuria -former smoker -several contrast and non-contrast CT's over the last year -no findings of GU malignancies  -cysto (11/2021) -consistent with radiation changes in the bladder  2. Left hydronephrosis -initially managed w/ left ureteral stent but discontinued due to continuing hematuria -managed with nephrostomy tube last exchanged (03/27/2022) -last nephrostomy tube exchange (02/17/2023)    3. Nephrolithiasis -emergent left stent placement for 2 mm distal stone (09/2021) -stone spontaneously passed after stent placed -CT (11/2022) - 2 mm right renal stone   4. rUTI's -contributing factors of age, vaginal atrophy, nephrostomy tube, diabetes,  -Documented urine cultures over the last year  February 10, 2023 Proteus penneri   October 24, 2022 multiple species  June 12, 2022 Enterococcus faecalis with a catheterized specimen  June 12, 2022 multiple species  April 16, 2022 Enterococcus faecalis             March 26, 2022 multiple species             March 13, 2022 Proteus penneri              No chief complaint on file.  HPI: Courtney Mack is a 67 y.o. female who presents today for management of her nephrostomy tube.    Previous records reviewed.   She was recently admitted for an episode of sepsis.  Her left nephrostomy tube was exchanged during that admission on August 12th.    Contrast CT performed in May noted findings suggestive of the bilateral collecting system and urinary bladder infection which she did have and that her left nephrostomy tube was the appropriate position and a nonobstructing 2 mm right renal stone.     PMH: Past Medical History:  Diagnosis Date   Acute bilateral deep vein thrombosis (DVT) of femoral veins (HCC)  01/02/2021   Acute massive pulmonary embolism (HCC) 08/25/2020   Last Assessment & Plan:  Formatting of this note might be different from the original. Post vena caval filter and on xarelto and O2   Anemia    ARF (acute respiratory failure) (HCC)    Cellulitis of left lower leg 06/20/2021   CHF (congestive heart failure) (HCC)    COVID-19    Diabetes mellitus without complication (HCC)    DVT (deep venous thrombosis) (HCC) 09/06/2020   Endometrial cancer (HCC)    GAVE (gastric antral vascular ectasia)    GERD (gastroesophageal reflux disease)    High cholesterol    Hx of blood clots    Hyperlipidemia    Hypertension    IDA (iron deficiency anemia) 09/21/2020   Obesity    Pneumonia 06/11/2022   recovered   Pressure injury of skin 06/21/2021   Pulmonary embolism (HCC)    Sepsis (HCC)    Thrombocytopenia (HCC)    Urinary tract infection 09/13/2021    Surgical History: Past Surgical History:  Procedure Laterality Date   CATARACT EXTRACTION W/PHACO Right 06/04/2022   Procedure: CATARACT EXTRACTION PHACO AND INTRAOCULAR LENS PLACEMENT (IOC) COMPLICATED RIGHT  31.83  02:17.4;  Surgeon: Galen Manila, MD;  Location: [facility];  Service: Ophthalmology;  Laterality: Right;   CATARACT EXTRACTION W/PHACO Left 07/09/2022   Procedure: CATARACT EXTRACTION PHACO AND INTRAOCULAR LENS PLACEMENT (IOC);  Surgeon: Galen Manila, MD;  Location: [facility];  Service: Ophthalmology;  Laterality: Left;  22.75 1:40.3   COLONOSCOPY N/A 06/21/2021   Procedure: COLONOSCOPY;  Surgeon: Toledo, Boykin Nearing, MD;  Location: [facility];  Service: Gastroenterology;  Laterality: N/A;   CYSTOSCOPY W/ URETERAL STENT PLACEMENT Left 09/11/2021   Procedure: CYSTOSCOPY WITH RETROGRADE PYELOGRAM/URETERAL STENT PLACEMENT;  Surgeon: Riki Altes, MD;  Location: [facility];  Service: Urology;  Laterality: Left;   CYSTOSCOPY W/ URETERAL STENT PLACEMENT Left 11/17/2021   Procedure: CYSTOSCOPY WITH  STENT REPLACEMENT;  Surgeon: Bjorn Pippin, MD;  Location: [facility];  Service: Urology;  Laterality: Left;   CYSTOSCOPY WITH FULGERATION  11/17/2021   Procedure: CYSTOSCOPY WITH FULGERATION;  Surgeon: Bjorn Pippin, MD;  Location: [facility];  Service: Urology;;   CYSTOSCOPY WITH STENT PLACEMENT Left 10/17/2021   Procedure: CYSTOSCOPY WITH STENT PLACEMENT;  Surgeon: Riki Altes, MD;  Location: [facility];  Service: Urology;  Laterality: Left;   CYSTOSCOPY WITH URETEROSCOPY, STONE BASKETRY AND STENT PLACEMENT Left 10/16/2021   Procedure: CYSTOSCOPY WITH URETEROSCOPY  AND STENT REMOVAL;  Surgeon: Riki Altes, MD;  Location: [facility];  Service: Urology;  Laterality: Left;   ENTEROSCOPY N/A 08/17/2021   Procedure: ENTEROSCOPY;  Surgeon: Toney Reil, MD;  Location: [facility];  Service: Gastroenterology;  Laterality: N/A;   ESOPHAGOGASTRODUODENOSCOPY N/A 06/21/2021   Procedure: ESOPHAGOGASTRODUODENOSCOPY (EGD);  Surgeon: Toledo, Boykin Nearing, MD;  Location: [facility];  Service: Gastroenterology;  Laterality: N/A;   ESOPHAGOGASTRODUODENOSCOPY (EGD) WITH PROPOFOL N/A 08/17/2021   Procedure: ESOPHAGOGASTRODUODENOSCOPY (EGD) WITH PROPOFOL;  Surgeon: Toney Reil, MD;  Location: [facility];  Service: Gastroenterology;  Laterality: N/A;   ESOPHAGOGASTRODUODENOSCOPY (EGD) WITH PROPOFOL N/A 10/14/2021   Procedure: ESOPHAGOGASTRODUODENOSCOPY (EGD) WITH PROPOFOL;  Surgeon: Midge Minium, MD;  Location: [facility];  Service: Endoscopy;  Laterality: N/A;   ESOPHAGOGASTRODUODENOSCOPY (EGD) WITH PROPOFOL N/A 02/01/2022   Procedure: ESOPHAGOGASTRODUODENOSCOPY (EGD) WITH PROPOFOL;  Surgeon: Toney Reil, MD;  Location: [facility];  Service: Gastroenterology;  Laterality: N/A;   ESOPHAGOGASTRODUODENOSCOPY (EGD) WITH PROPOFOL N/A 03/15/2022   Procedure: ESOPHAGOGASTRODUODENOSCOPY (EGD) WITH PROPOFOL;  Surgeon: Wyline Mood, MD;  Location: [facility] END[facility]COPY;  Service: Gastroenterology;   Laterality: N/A;   ESOPHAGOGASTRODUODENOSCOPY (EGD) WITH PROPOFOL N/A 04/18/2022   Procedure: ESOPHAGOGASTRODUODENOSCOPY (EGD) WITH PROPOFOL;  Surgeon: Toney Reil, MD;  Location: [facility];  Service: Gastroenterology;  Laterality: N/A;   ESOPHAGOGASTRODUODENOSCOPY (EGD) WITH PROPOFOL N/A 08/09/2022   Procedure: ESOPHAGOGASTRODUODENOSCOPY (EGD) WITH PROPOFOL;  Surgeon: Wyline Mood, MD;  Location: [facility];  Service: Gastroenterology;  Laterality: N/A;   GIVENS CAPSULE STUDY N/A 06/22/2021   Procedure: GIVENS CAPSULE STUDY;  Surgeon: Toledo, Boykin Nearing, MD;  Location: [facility];  Service: Gastroenterology;  Laterality: N/A;   IR NEPHROSTOMY EXCHANGE LEFT  02/25/2022   IR NEPHROSTOMY EXCHANGE LEFT  03/27/2022   IR NEPHROSTOMY EXCHANGE LEFT  05/22/2022   IR NEPHROSTOMY EXCHANGE LEFT  07/24/2022   IR NEPHROSTOMY EXCHANGE LEFT  09/24/2022   IR NEPHROSTOMY EXCHANGE LEFT  11/21/2022   IR NEPHROSTOMY EXCHANGE LEFT  02/17/2023   IR NEPHROSTOMY PLACEMENT LEFT  12/27/2021   IVC FILTER INSERTION N/A 08/18/2020   Procedure: IVC FILTER INSERTION;  Surgeon: Renford Dills, MD;  Location: [facility];  Service: Cardiovascular;  Laterality: N/A;   PERIPHERAL VASCULAR THROMBECTOMY Bilateral 01/03/2021   Procedure: PERIPHERAL VASCULAR THROMBECTOMY;  Surgeon: Annice Needy, MD;  Location: [facility];  Service: Cardiovascular;  Laterality: Bilateral;   PULMONARY THROMBECTOMY N/A 08/18/2020   Procedure: PULMONARY THROMBECTOMY;  Surgeon: Renford Dills, MD;  Location: [facility];  Service: Cardiovascular;  Laterality: N/A;   TUBAL LIGATION     VISCERAL ANGIOGRAPHY N/A 07/18/2021   Procedure: VISCERAL ANGIOGRAPHY;  Surgeon: Annice Needy, MD;  Location: [facility];  Service: Cardiovascular;  Laterality: N/A;    Home Medications:  Allergies as of 02/27/2023   No Known Allergies      Medication List        Accurate as of February 26, 2023 10:29 AM. If you  have any questions, ask your nurse or doctor.          acetaminophen 500 MG tablet Commonly known as: TYLENOL Take 500 mg by mouth every 6 (six) hours as needed for mild pain or fever.   albuterol 108 (90 Base) MCG/ACT inhaler Commonly known as: VENTOLIN HFA Inhale 2 puffs into the lungs every 6 (six) hours as needed for wheezing or shortness of breath.   FeroSul 325 (65 Fe) MG tablet Generic drug: ferrous sulfate Take 325 mg by mouth daily with breakfast.   fluticasone furoate-vilanterol 100-25 MCG/ACT Aepb Commonly known as: BREO ELLIPTA Inhale 1 puff into the lungs daily.   furosemide 20 MG tablet Commonly known as: LASIX Take 1 tablet (20 mg total) by mouth daily. And additional 20mg  if needed   loperamide 2 MG capsule Commonly known as: IMODIUM Take 2 mg by mouth as needed for diarrhea or loose stools.   midodrine 5 MG tablet Commonly known as: PROAMATINE Take 1 tablet (5 mg total) by mouth 3 (three) times daily with meals.   multivitamin with minerals Tabs tablet Take 1 tablet by mouth daily.   nystatin powder Commonly known as: MYCOSTATIN/NYSTOP Apply 1 Application topically 4 (four) times daily as needed.   pantoprazole 40 MG tablet Commonly known as: PROTONIX Take 1 tablet (40 mg total) by mouth 2 (two) times daily.   rosuvastatin 10 MG tablet Commonly known as: CRESTOR Take 1 tablet (10 mg total) by mouth daily.   triamcinolone cream 0.1 % Commonly known as: KENALOG Apply 1 Application topically 2 (two) times daily as needed.   vitamin C 1000 MG tablet Take 1,000 mg by mouth daily.        Allergies: No Known Allergies  Family History: Family History  Problem Relation Age of Onset   Hypertension Mother    Diabetes Mother    Diabetes Father    Hypertension Father    High Cholesterol Father    Congestive Heart Failure Father    Breast cancer Cousin     Social History:  reports that she has never smoked. She has never used smokeless  tobacco. She reports that she does not currently use alcohol. She reports that she does not use drugs.  ROS: Pertinent ROS in HPI  Physical Exam: There were no vitals taken for this visit.  Constitutional:  Well nourished. Alert and oriented, No acute distress. HEENT: [place] AT, moist mucus membranes.  Trachea midline, no masses. Cardiovascular: No clubbing, cyanosis, or edema. Respiratory: Normal respiratory effort, no increased work of breathing. GU: No CVA tenderness.  No bladder fullness or masses.  Recession of labia minora, dry, pale vulvar vaginal mucosa and loss of mucosal ridges and folds.  Normal urethral meatus, no lesions, no prolapse, no discharge.   No urethral masses, tenderness and/or tenderness. No bladder fullness, tenderness or masses. *** vagina mucosa, *** estrogen effect, no discharge, no lesions, *** pelvic support, *** cystocele and *** rectocele noted.  No cervical motion tenderness.  Uterus is freely mobile and non-fixed.  No adnexal/parametria masses or tenderness noted.  Anus and perineum are without rashes or lesions.   ***  Neurologic: Grossly intact, no focal deficits, moving all 4 extremities. Psychiatric: Normal mood and affect.    Laboratory Data: Lab Results  Component Value Date   WBC 4.4 02/12/2023   HGB 8.1 (L) 02/12/2023   HCT 25.3 (L) 02/12/2023   MCV 86.1 02/12/2023   PLT 153 02/12/2023    Lab Results  Component Value Date   CREATININE 0.98 02/12/2023   Lab Results  Component Value Date   AST 26 02/10/2023   Lab Results  Component Value Date   ALT 17 02/10/2023    Urinalysis    Component Value Date/Time   COLORURINE YELLOW (A) 02/10/2023 1152   APPEARANCEUR TURBID (A) 02/10/2023 1152   APPEARANCEUR Cloudy (A) 01/03/2022 1443   LABSPEC 1.016 02/10/2023 1152   PHURINE 8.0 02/10/2023 1152   GLUCOSEU NEGATIVE 02/10/2023 1152   HGBUR SMALL (A) 02/10/2023 1152   BILIRUBINUR NEGATIVE 02/10/2023 1152   BILIRUBINUR Negative 01/03/2022 1443    KETONESUR NEGATIVE 02/10/2023 1152   PROTEINUR >=300 (A) 02/10/2023 1152   NITRITE NEGATIVE 02/10/2023 1152   LEUKOCYTESUR MODERATE (A) 02/10/2023 1152  I have reviewed the labs.   Pertinent Imaging: CLINICAL DATA:  Abdominal pain, acute, nonlocalized   EXAM: CT ABDOMEN AND PELVIS WITH CONTRAST   TECHNIQUE: Multidetector CT imaging of the abdomen and pelvis was performed using the standard protocol following bolus administration of intravenous contrast.   RADIATION DOSE REDUCTION: This exam was performed according to the departmental dose-optimization program which includes automated exposure control, adjustment of the mA and/or kV according to patient size and/or use of iterative reconstruction technique.   CONTRAST:  75mL OMNIPAQUE IOHEXOL 350 MG/ML SOLN   COMPARISON:  CT angio chest 11/20/2022, CT abdomen pelvis 03/26/2022   FINDINGS: Lower chest: Please see separately dictated CT angio chest 11/20/2022.   Hepatobiliary: The liver is mildly enlarged measuring up to 18 cm. No focal liver abnormality. No gallstones, gallbladder wall thickening, or pericholecystic fluid. No biliary dilatation.   Pancreas: Diffusely atrophic. No focal lesion. Otherwise normal pancreatic contour. No surrounding inflammatory changes. No main pancreatic ductal dilatation.   Spleen: Normal in size without focal abnormality.   Adrenals/Urinary Tract:   No adrenal nodule bilaterally.   Left percutaneous nephrostomy tube with pigtail terminating within the renal pelvis. Bilateral kidneys enhance symmetrically.   No hydronephrosis. No hydroureter. 2 mm right nephrolithiasis. No left nephrolithiasis. Mild urothelial thickening of bilateral collecting system with slightly hazy urinary bladder wall contour and mild perivesicular fat stranding.   The urinary bladder is unremarkable.   On delayed imaging, there is no urothelial wall thickening and there are no filling defects in the  opacified portions of the bilateral collecting systems or ureters.   Stomach/Bowel: Stomach is within normal limits. No evidence of bowel wall thickening or dilatation. Under distended colon. Appendix appears normal.   Vascular/Lymphatic: Inferior vena cava filter in appropriate position. No abdominal aorta or iliac aneurysm. Moderate atherosclerotic plaque of the aorta and its branches. No abdominal, pelvic, or inguinal lymphadenopathy.   Reproductive: Uterus and bilateral adnexa are unremarkable.   Other: No intraperitoneal free fluid. No intraperitoneal free gas. No organized fluid collection.   Musculoskeletal:   No abdominal wall hernia or abnormality. Diffuse subcutaneus soft tissue edema.   No suspicious lytic or blastic osseous lesions. No acute displaced fracture. Severe degenerative changes of the bilateral hips.   IMPRESSION: 1. Findings suggestive of  bilateral collecting system and urinary bladder infection. Correlate with urinalysis. 2. Left nephrostomy tube in appropriate position. 3. Nonobstructive 2 mm right nephrolithiasis. 4. Mild hepatomegaly. 5. Severe bilateral degenerative changes of the hips. 6.  Aortic Atherosclerosis (ICD10-I70.0).     Electronically Signed   By: Tish Frederickson M.D.   On: 11/21/2022 00:35  I have independently reviewed the films.    Assessment & Plan:  ***  1. Chronic left hydronephrosis -secondary to pelvic radiation -She could not tolerate and indwelling ureteral stent because of continued gross hematuria -It is now managed with a left nephrostomy tube that is exchanged every 6 weeks  2. High risk hematuria -non-smoker -several upper tract imaging w/o findings of malignancies -cysto (11/2021) -radiation cystitis -no reports of gross heme  3. Right renal stone -Asymptomatic  4. rUTI's -***  No follow-ups on file.  These notes generated with voice recognition software. I apologize for typographical  errors.  Cloretta Ned  [facility] [address]  [address] [place], [place] [address] [phone]

## 2023-02-27 ENCOUNTER — Ambulatory Visit: Payer: Medicare HMO | Admitting: Urology

## 2023-02-27 DIAGNOSIS — N1339 Other hydronephrosis: Secondary | ICD-10-CM

## 2023-02-27 DIAGNOSIS — N390 Urinary tract infection, site not specified: Secondary | ICD-10-CM

## 2023-02-27 DIAGNOSIS — R319 Hematuria, unspecified: Secondary | ICD-10-CM

## 2023-02-27 DIAGNOSIS — N200 Calculus of kidney: Secondary | ICD-10-CM

## 2023-03-19 ENCOUNTER — Encounter: Payer: Self-pay | Admitting: *Deleted

## 2023-03-19 ENCOUNTER — Inpatient Hospital Stay: Payer: Medicare HMO

## 2023-03-20 ENCOUNTER — Other Ambulatory Visit: Payer: Medicare HMO

## 2023-03-21 ENCOUNTER — Inpatient Hospital Stay: Payer: Medicare HMO | Attending: Oncology

## 2023-03-21 DIAGNOSIS — Z86718 Personal history of other venous thrombosis and embolism: Secondary | ICD-10-CM | POA: Insufficient documentation

## 2023-03-21 DIAGNOSIS — D509 Iron deficiency anemia, unspecified: Secondary | ICD-10-CM | POA: Insufficient documentation

## 2023-03-21 DIAGNOSIS — Z8542 Personal history of malignant neoplasm of other parts of uterus: Secondary | ICD-10-CM | POA: Insufficient documentation

## 2023-03-24 ENCOUNTER — Inpatient Hospital Stay: Payer: Medicare HMO

## 2023-03-24 DIAGNOSIS — D500 Iron deficiency anemia secondary to blood loss (chronic): Secondary | ICD-10-CM

## 2023-03-24 DIAGNOSIS — Z86718 Personal history of other venous thrombosis and embolism: Secondary | ICD-10-CM | POA: Diagnosis not present

## 2023-03-24 DIAGNOSIS — Z8542 Personal history of malignant neoplasm of other parts of uterus: Secondary | ICD-10-CM | POA: Diagnosis not present

## 2023-03-24 DIAGNOSIS — D509 Iron deficiency anemia, unspecified: Secondary | ICD-10-CM | POA: Diagnosis present

## 2023-03-24 LAB — CBC WITH DIFFERENTIAL (CANCER CENTER ONLY)
Abs Immature Granulocytes: 0.01 10*3/uL (ref 0.00–0.07)
Basophils Absolute: 0 10*3/uL (ref 0.0–0.1)
Basophils Relative: 1 %
Eosinophils Absolute: 0.2 10*3/uL (ref 0.0–0.5)
Eosinophils Relative: 4 %
HCT: 33.2 % — ABNORMAL LOW (ref 36.0–46.0)
Hemoglobin: 9.8 g/dL — ABNORMAL LOW (ref 12.0–15.0)
Immature Granulocytes: 0 %
Lymphocytes Relative: 16 %
Lymphs Abs: 0.8 10*3/uL (ref 0.7–4.0)
MCH: 28 pg (ref 26.0–34.0)
MCHC: 29.5 g/dL — ABNORMAL LOW (ref 30.0–36.0)
MCV: 94.9 fL (ref 80.0–100.0)
Monocytes Absolute: 0.3 10*3/uL (ref 0.1–1.0)
Monocytes Relative: 6 %
Neutro Abs: 3.3 10*3/uL (ref 1.7–7.7)
Neutrophils Relative %: 73 %
Platelet Count: 256 10*3/uL (ref 150–400)
RBC: 3.5 MIL/uL — ABNORMAL LOW (ref 3.87–5.11)
RDW: 18.3 % — ABNORMAL HIGH (ref 11.5–15.5)
WBC Count: 4.6 10*3/uL (ref 4.0–10.5)
nRBC: 0 % (ref 0.0–0.2)

## 2023-03-24 LAB — IRON AND TIBC
Iron: 227 ug/dL — ABNORMAL HIGH (ref 28–170)
Saturation Ratios: 63 % — ABNORMAL HIGH (ref 10.4–31.8)
TIBC: 363 ug/dL (ref 250–450)
UIBC: 136 ug/dL

## 2023-03-24 LAB — FERRITIN: Ferritin: 13 ng/mL (ref 11–307)

## 2023-03-24 NOTE — Progress Notes (Deleted)
Cardiology Office Note  Date:  03/24/2023   ID:  Courtney Mack, DOB 1955/11/02, MRN [id number]  PCP:  [facility]   No chief complaint on file.   HPI:  [name] is a 67 year old woman with past medical history of Morbid obesity Smoking DVT PE February 2022 Uterine cancer treated with radiation, brachytherapy  s/p Suction thrombectomy of B pulmonary arteries, IVC filter placement Recurrent bilateral lower extremity DVT and PE June 2022, with thrombectomy GI bleed December 2022, hemoglobin to 3.6, nonbleeding ulcer identified, Xarelto restarted Recurrent admission for melena mid January 2023, hemoglobin 4.8, Hospitalized February 2023 hypoglycemia, hemoglobin 4.7, tachycardic COVID March 2023 Hospitalized March 2023 urinary tract infection/sepsis, left ureteral stent placed, Proteus and Enterobacter blood cultures, diuresis 21 L Who presents by referral from hospitalist service for CHF symptoms  Last seen by myself in clinic March 2023 Followed by CHF clinic, last evaluated July 2024    On her visit today, she reports that she is scheduled for urologic procedure, Cystoscopy, Stent removal October 16, 2021  Presents in a wheelchair, not very active at baseline Long history lymphedema, does not use lymphedema compression pumps Difficulty ambulating, very deconditioned  Lab work reviewed, HGB 7.7 Daughter who presents with her today reports she does not feel anybody is watching her blood count on a regular basis  Echo 09/12/21, reviewed 1. Left ventricular ejection fraction, by estimation, is 55 to 60%. The  left ventricle has normal function. The left ventricle has no regional  wall motion abnormalities. There is mild asymmetric left ventricular  hypertrophy of the basal-septal segment.  Left ventricular diastolic parameters were normal.   2. Right ventricular systolic function is normal. The right ventricular  size is normal.   CT  scan 09/11/21, reviewed 1. 2 mm distal left ureteral calculus with mild to moderate severity left-sided hydronephrosis and hydroureter. 2. Mild left upper lobe and left basilar atelectatic changes. 3. Stable marked severity anasarca  EKG personally reviewed by myself on todays visit Normal sinus rhythm rate 88 bpm no significant ST-T wave changes   PMH:   has a past medical history of Acute bilateral deep vein thrombosis (DVT) of femoral veins (HCC) (01/02/2021), Acute massive pulmonary embolism (HCC) (08/25/2020), Anemia, ARF (acute respiratory failure) (HCC), Cellulitis of left lower leg (06/20/2021), CHF (congestive heart failure) (HCC), COVID-19, Diabetes mellitus without complication (HCC), DVT (deep venous thrombosis) (HCC) (09/06/2020), Endometrial cancer (HCC), GAVE (gastric antral vascular ectasia), GERD (gastroesophageal reflux disease), High cholesterol, blood clots, Hyperlipidemia, Hypertension, IDA (iron deficiency anemia) (09/21/2020), Obesity, Pneumonia (06/11/2022), Pressure injury of skin (06/21/2021), Pulmonary embolism (HCC), Sepsis (HCC), Thrombocytopenia (HCC), and Urinary tract infection (09/13/2021).  PSH:    Past Surgical History:  Procedure Laterality Date   CATARACT EXTRACTION W/PHACO Right 06/04/2022   Procedure: CATARACT EXTRACTION PHACO AND INTRAOCULAR LENS PLACEMENT (IOC) COMPLICATED RIGHT  31.83  02:17.4;  Surgeon: Galen Manila, MD;  Location: [facility];  Service: Ophthalmology;  Laterality: Right;   CATARACT EXTRACTION W/PHACO Left 07/09/2022   Procedure: CATARACT EXTRACTION PHACO AND INTRAOCULAR LENS PLACEMENT (IOC);  Surgeon: Galen Manila, MD;  Location: [facility];  Service: Ophthalmology;  Laterality: Left;  22.75 1:40.3   COLONOSCOPY N/A 06/21/2021   Procedure: COLONOSCOPY;  Surgeon: Toledo, Boykin Nearing, MD;  Location: [facility];  Service: Gastroenterology;  Laterality: N/A;   CYSTOSCOPY W/ URETERAL STENT PLACEMENT Left 09/11/2021    Procedure: CYSTOSCOPY WITH RETROGRADE PYELOGRAM/URETERAL STENT PLACEMENT;  Surgeon: Riki Altes, MD;  Location: [facility];  Service: Urology;  Laterality: Left;   CYSTOSCOPY W/ URETERAL STENT PLACEMENT Left 11/17/2021   Procedure: CYSTOSCOPY WITH STENT REPLACEMENT;  Surgeon: Bjorn Pippin, MD;  Location: [facility];  Service: Urology;  Laterality: Left;   CYSTOSCOPY WITH FULGERATION  11/17/2021   Procedure: CYSTOSCOPY WITH FULGERATION;  Surgeon: Bjorn Pippin, MD;  Location: [facility];  Service: Urology;;   CYSTOSCOPY WITH STENT PLACEMENT Left 10/17/2021   Procedure: CYSTOSCOPY WITH STENT PLACEMENT;  Surgeon: Riki Altes, MD;  Location: [facility];  Service: Urology;  Laterality: Left;   CYSTOSCOPY WITH URETEROSCOPY, STONE BASKETRY AND STENT PLACEMENT Left 10/16/2021   Procedure: CYSTOSCOPY WITH URETEROSCOPY  AND STENT REMOVAL;  Surgeon: Riki Altes, MD;  Location: [facility];  Service: Urology;  Laterality: Left;   ENTEROSCOPY N/A 08/17/2021   Procedure: ENTEROSCOPY;  Surgeon: Toney Reil, MD;  Location: [facility];  Service: Gastroenterology;  Laterality: N/A;   ESOPHAGOGASTRODUODENOSCOPY N/A 06/21/2021   Procedure: ESOPHAGOGASTRODUODENOSCOPY (EGD);  Surgeon: Toledo, Boykin Nearing, MD;  Location: [facility];  Service: Gastroenterology;  Laterality: N/A;   ESOPHAGOGASTRODUODENOSCOPY (EGD) WITH PROPOFOL N/A 08/17/2021   Procedure: ESOPHAGOGASTRODUODENOSCOPY (EGD) WITH PROPOFOL;  Surgeon: Toney Reil, MD;  Location: [facility] ENDOSCOPY;  Service: Gastroenterology;  Laterality: N/A;   ESOPHAGOGASTRODUODENOSCOPY (EGD) WITH PROPOFOL N/A 10/14/2021   Procedure: ESOPHAGOGASTRODUODENOSCOPY (EGD) WITH PROPOFOL;  Surgeon: Midge Minium, MD;  Location: [facility];  Service: Endoscopy;  Laterality: N/A;   ESOPHAGOGASTRODUODENOSCOPY (EGD) WITH PROPOFOL N/A 02/01/2022   Procedure: ESOPHAGOGASTRODUODENOSCOPY (EGD) WITH PROPOFOL;  Surgeon: Toney Reil, MD;  Location: [facility] ENDOSCOPY;   Service: Gastroenterology;  Laterality: N/A;   ESOPHAGOGASTRODUODENOSCOPY (EGD) WITH PROPOFOL N/A 03/15/2022   Procedure: ESOPHAGOGASTRODUODENOSCOPY (EGD) WITH PROPOFOL;  Surgeon: Wyline Mood, MD;  Location: [facility] ENDOSCOPY;  Service: Gastroenterology;  Laterality: N/A;   ESOPHAGOGASTRODUODENOSCOPY (EGD) WITH PROPOFOL N/A 04/18/2022   Procedure: ESOPHAGOGASTRODUODENOSCOPY (EGD) WITH PROPOFOL;  Surgeon: Toney Reil, MD;  Location: [facility] ENDOSCOPY;  Service: Gastroenterology;  Laterality: N/A;   ESOPHAGOGASTRODUODENOSCOPY (EGD) WITH PROPOFOL N/A 08/09/2022   Procedure: ESOPHAGOGASTRODUODENOSCOPY (EGD) WITH PROPOFOL;  Surgeon: Wyline Mood, MD;  Location: [facility] ENDOSCOPY;  Service: Gastroenterology;  Laterality: N/A;   GIVENS CAPSULE STUDY N/A 06/22/2021   Procedure: GIVENS CAPSULE STUDY;  Surgeon: Toledo, Boykin Nearing, MD;  Location: [facility];  Service: Gastroenterology;  Laterality: N/A;   IR NEPHROSTOMY EXCHANGE LEFT  02/25/2022   IR NEPHROSTOMY EXCHANGE LEFT  03/27/2022   IR NEPHROSTOMY EXCHANGE LEFT  05/22/2022   IR NEPHROSTOMY EXCHANGE LEFT  07/24/2022   IR NEPHROSTOMY EXCHANGE LEFT  09/24/2022   IR NEPHROSTOMY EXCHANGE LEFT  11/21/2022   IR NEPHROSTOMY EXCHANGE LEFT  02/17/2023   IR NEPHROSTOMY PLACEMENT LEFT  12/27/2021   IVC FILTER INSERTION N/A 08/18/2020   Procedure: IVC FILTER INSERTION;  Surgeon: Renford Dills, MD;  Location: [facility];  Service: Cardiovascular;  Laterality: N/A;   PERIPHERAL VASCULAR THROMBECTOMY Bilateral 01/03/2021   Procedure: PERIPHERAL VASCULAR THROMBECTOMY;  Surgeon: Annice Needy, MD;  Location: [facility];  Service: Cardiovascular;  Laterality: Bilateral;   PULMONARY THROMBECTOMY N/A 08/18/2020   Procedure: PULMONARY THROMBECTOMY;  Surgeon: Renford Dills, MD;  Location: [facility];  Service: Cardiovascular;  Laterality: N/A;   TUBAL LIGATION     VISCERAL ANGIOGRAPHY N/A 07/18/2021   Procedure: VISCERAL ANGIOGRAPHY;  Surgeon:  Annice Needy, MD;  Location: [facility];  Service: Cardiovascular;  Laterality: N/A;    Current Outpatient Medications  Medication Sig Dispense Refill   acetaminophen (TYLENOL)  500 MG tablet Take 500 mg by mouth every 6 (six) hours as needed for mild pain or fever.     albuterol (VENTOLIN HFA) 108 (90 Base) MCG/ACT inhaler Inhale 2 puffs into the lungs every 6 (six) hours as needed for wheezing or shortness of breath. 8 g 0   Ascorbic Acid (VITAMIN C) 1000 MG tablet Take 1,000 mg by mouth daily.     FEROSUL 325 (65 Fe) MG tablet Take 325 mg by mouth daily with breakfast.     fluticasone furoate-vilanterol (BREO ELLIPTA) 100-25 MCG/ACT AEPB Inhale 1 puff into the lungs daily.     furosemide (LASIX) 20 MG tablet Take 1 tablet (20 mg total) by mouth daily. And additional 20mg  if needed 45 tablet 3   loperamide (IMODIUM) 2 MG capsule Take 2 mg by mouth as needed for diarrhea or loose stools.     midodrine (PROAMATINE) 5 MG tablet Take 1 tablet (5 mg total) by mouth 3 (three) times daily with meals. 90 tablet 1   Multiple Vitamin (MULTIVITAMIN WITH MINERALS) TABS tablet Take 1 tablet by mouth daily.     nystatin (MYCOSTATIN/NYSTOP) powder Apply 1 Application topically 4 (four) times daily as needed.     pantoprazole (PROTONIX) 40 MG tablet Take 1 tablet (40 mg total) by mouth 2 (two) times daily. 60 tablet 1   rosuvastatin (CRESTOR) 10 MG tablet Take 1 tablet (10 mg total) by mouth daily. 30 tablet 5   triamcinolone cream (KENALOG) 0.1 % Apply 1 Application topically 2 (two) times daily as needed.     No current facility-administered medications for this visit.    Allergies:   Patient has no known allergies.   Social History:  The patient  reports that she has never smoked. She has never used smokeless tobacco. She reports that she does not currently use alcohol. She reports that she does not use drugs.   Family History:   family history includes Breast cancer in her cousin; Congestive  Heart Failure in her father; Diabetes in her father and mother; High Cholesterol in her father; Hypertension in her father and mother.    Review of Systems: Review of Systems  Constitutional: Negative.   HENT: Negative.    Respiratory:  Positive for shortness of breath.   Cardiovascular:  Positive for leg swelling.  Gastrointestinal: Negative.   Musculoskeletal: Negative.   Neurological: Negative.   Psychiatric/Behavioral: Negative.    All other systems reviewed and are negative.    PHYSICAL EXAM: VS:  There were no vitals taken for this visit. , BMI There is no height or weight on file to calculate BMI. GEN: Well nourished, well developed, in no acute distress, obese HEENT: normal Neck: no JVD, carotid bruits, or masses Cardiac: RRR; no murmurs, rubs, or gallops,no edema  Respiratory:  clear to auscultation bilaterally, normal work of breathing GI: soft, nontender, nondistended, + BS MS: no deformity or atrophy Skin: warm and dry, no rash Neuro:  Strength and sensation are intact Psych: euthymic mood, full affect   Recent Labs: 10/26/2022: Magnesium 2.2 11/20/2022: B Natriuretic Peptide 41.4 02/10/2023: ALT 17 02/12/2023: BUN 21; Creatinine, Ser 0.98; Hemoglobin 8.1; Platelets 153; Potassium 3.5; Sodium 138    Lipid Panel Lab Results  Component Value Date   CHOL 215 (H) 08/20/2020   HDL 27 (L) 08/20/2020   LDLCALC 157 (H) 08/20/2020   TRIG 154 (H) 08/20/2020     Wt Readings from Last 3 Encounters:  02/10/23 190 lb (86.2 kg)  01/21/23 190  lb (86.2 kg)  12/24/22 185 lb (83.9 kg)      ASSESSMENT AND PLAN:  Problem List Items Addressed This Visit   None   Preop cardiovascular evaluation Acceptable risk for urologic procedure scheduled in April 2023 Recommend she hold Xarelto 3 days prior to procedure Restart Xarelto once cleared by surgery  History of DVT, PE Has had a history of recurrent DVT PE, will need indefinite anticoagulation Likely exacerbated by  immobility, lymphedema, prior history of DVT and PE She is on Xarelto  History of GI bleed, chronic anemia Has had numerous EGD, colonoscopy procedures, followed by GI Has had numerous transfusions in the past Has chronic anemia which will exacerbate CHF symptoms Daughter concerned that nobody is monitoring blood count She does not have regular follow-up with primary care or GI Recommend she seek consultation with hematology for close monitoring, may need iron, periodic transfusion  Morbid obesity Calorie restriction recommended, unable to exercise secondary to immobility  CHF, diastolic Normal ejection fraction on recent echo March 2023 Hospital records reviewed, notes indicating 20 L diuresis in March 2023 Recommend she moderate her fluid intake, remain on Lasix 20 daily   Total encounter time more than 60 minutes Greater than 50% was spent in counseling and coordination of care with the patient   Signed, Dossie Arbour, M.D., Ph.D. [facility] [place], [place] [phone]

## 2023-03-25 ENCOUNTER — Ambulatory Visit: Payer: Medicare HMO | Admitting: Cardiovascular Disease

## 2023-03-25 DIAGNOSIS — D649 Anemia, unspecified: Secondary | ICD-10-CM

## 2023-03-25 DIAGNOSIS — I10 Essential (primary) hypertension: Secondary | ICD-10-CM

## 2023-03-25 DIAGNOSIS — Z86711 Personal history of pulmonary embolism: Secondary | ICD-10-CM

## 2023-03-25 DIAGNOSIS — N390 Urinary tract infection, site not specified: Secondary | ICD-10-CM

## 2023-03-25 DIAGNOSIS — I890 Lymphedema, not elsewhere classified: Secondary | ICD-10-CM

## 2023-03-25 DIAGNOSIS — E119 Type 2 diabetes mellitus without complications: Secondary | ICD-10-CM

## 2023-03-25 DIAGNOSIS — I5032 Chronic diastolic (congestive) heart failure: Secondary | ICD-10-CM

## 2023-03-25 DIAGNOSIS — Z86718 Personal history of other venous thrombosis and embolism: Secondary | ICD-10-CM

## 2023-03-26 ENCOUNTER — Inpatient Hospital Stay: Payer: Medicare HMO | Admitting: Oncology

## 2023-03-26 ENCOUNTER — Inpatient Hospital Stay: Payer: Medicare HMO

## 2023-04-05 ENCOUNTER — Inpatient Hospital Stay
Admission: EM | Admit: 2023-04-05 | Discharge: 2023-04-08 | DRG: 699 | Disposition: A | Discharge status: Home / Self Care | Payer: Medicare HMO | Attending: Obstetrics and Gynecology | Admitting: Obstetrics and Gynecology

## 2023-04-05 ENCOUNTER — Other Ambulatory Visit: Payer: Self-pay

## 2023-04-05 ENCOUNTER — Emergency Department: Payer: Medicare HMO

## 2023-04-05 DIAGNOSIS — Z8249 Family history of ischemic heart disease and other diseases of the circulatory system: Secondary | ICD-10-CM | POA: Diagnosis not present

## 2023-04-05 DIAGNOSIS — Z8616 Personal history of COVID-19: Secondary | ICD-10-CM

## 2023-04-05 DIAGNOSIS — N3090 Cystitis, unspecified without hematuria: Secondary | ICD-10-CM | POA: Diagnosis present

## 2023-04-05 DIAGNOSIS — Z833 Family history of diabetes mellitus: Secondary | ICD-10-CM | POA: Diagnosis not present

## 2023-04-05 DIAGNOSIS — E7800 Pure hypercholesterolemia, unspecified: Secondary | ICD-10-CM | POA: Diagnosis present

## 2023-04-05 DIAGNOSIS — T83092A Other mechanical complication of nephrostomy catheter, initial encounter: Secondary | ICD-10-CM | POA: Diagnosis present

## 2023-04-05 DIAGNOSIS — Z86711 Personal history of pulmonary embolism: Secondary | ICD-10-CM | POA: Diagnosis not present

## 2023-04-05 DIAGNOSIS — T83098A Other mechanical complication of other indwelling urethral catheter, initial encounter: Secondary | ICD-10-CM | POA: Diagnosis present

## 2023-04-05 DIAGNOSIS — Z86718 Personal history of other venous thrombosis and embolism: Secondary | ICD-10-CM | POA: Diagnosis not present

## 2023-04-05 DIAGNOSIS — Z83438 Family history of other disorder of lipoprotein metabolism and other lipidemia: Secondary | ICD-10-CM

## 2023-04-05 DIAGNOSIS — Z8542 Personal history of malignant neoplasm of other parts of uterus: Secondary | ICD-10-CM

## 2023-04-05 DIAGNOSIS — Z95828 Presence of other vascular implants and grafts: Secondary | ICD-10-CM

## 2023-04-05 DIAGNOSIS — N1330 Unspecified hydronephrosis: Secondary | ICD-10-CM | POA: Diagnosis present

## 2023-04-05 DIAGNOSIS — J449 Chronic obstructive pulmonary disease, unspecified: Secondary | ICD-10-CM | POA: Diagnosis present

## 2023-04-05 DIAGNOSIS — K219 Gastro-esophageal reflux disease without esophagitis: Secondary | ICD-10-CM | POA: Diagnosis present

## 2023-04-05 DIAGNOSIS — Z936 Other artificial openings of urinary tract status: Secondary | ICD-10-CM

## 2023-04-05 DIAGNOSIS — I110 Hypertensive heart disease with heart failure: Secondary | ICD-10-CM | POA: Diagnosis present

## 2023-04-05 DIAGNOSIS — Z1152 Encounter for screening for COVID-19: Secondary | ICD-10-CM | POA: Diagnosis not present

## 2023-04-05 DIAGNOSIS — I5032 Chronic diastolic (congestive) heart failure: Secondary | ICD-10-CM | POA: Diagnosis present

## 2023-04-05 DIAGNOSIS — Z8719 Personal history of other diseases of the digestive system: Secondary | ICD-10-CM

## 2023-04-05 DIAGNOSIS — I9589 Other hypotension: Secondary | ICD-10-CM | POA: Diagnosis present

## 2023-04-05 DIAGNOSIS — I10 Essential (primary) hypertension: Secondary | ICD-10-CM | POA: Insufficient documentation

## 2023-04-05 DIAGNOSIS — N1339 Other hydronephrosis: Secondary | ICD-10-CM | POA: Diagnosis present

## 2023-04-05 DIAGNOSIS — T83512A Infection and inflammatory reaction due to nephrostomy catheter, initial encounter: Secondary | ICD-10-CM | POA: Diagnosis present

## 2023-04-05 DIAGNOSIS — E119 Type 2 diabetes mellitus without complications: Secondary | ICD-10-CM | POA: Diagnosis present

## 2023-04-05 DIAGNOSIS — N390 Urinary tract infection, site not specified: Principal | ICD-10-CM | POA: Diagnosis present

## 2023-04-05 DIAGNOSIS — Z79899 Other long term (current) drug therapy: Secondary | ICD-10-CM

## 2023-04-05 DIAGNOSIS — Y738 Miscellaneous gastroenterology and urology devices associated with adverse incidents, not elsewhere classified: Secondary | ICD-10-CM | POA: Diagnosis present

## 2023-04-05 DIAGNOSIS — E876 Hypokalemia: Secondary | ICD-10-CM | POA: Diagnosis present

## 2023-04-05 DIAGNOSIS — C541 Malignant neoplasm of endometrium: Secondary | ICD-10-CM | POA: Diagnosis present

## 2023-04-05 DIAGNOSIS — E66813 Obesity, class 3: Secondary | ICD-10-CM | POA: Diagnosis present

## 2023-04-05 DIAGNOSIS — N99528 Other complication of other external stoma of urinary tract: Secondary | ICD-10-CM

## 2023-04-05 DIAGNOSIS — I82409 Acute embolism and thrombosis of unspecified deep veins of unspecified lower extremity: Secondary | ICD-10-CM | POA: Diagnosis present

## 2023-04-05 DIAGNOSIS — Z803 Family history of malignant neoplasm of breast: Secondary | ICD-10-CM

## 2023-04-05 LAB — URINALYSIS, ROUTINE W REFLEX MICROSCOPIC
Bilirubin Urine: NEGATIVE
Glucose, UA: NEGATIVE mg/dL
Ketones, ur: NEGATIVE mg/dL
Nitrite: NEGATIVE
Protein, ur: 30 mg/dL — AB
Specific Gravity, Urine: 1.013 (ref 1.005–1.030)
WBC, UA: >50 WBC/hpf (ref 0–5)
pH: 5 (ref 5.0–8.0)

## 2023-04-05 LAB — CBC WITH DIFFERENTIAL/PLATELET
Abs Immature Granulocytes: 0.01 10*3/uL (ref 0.00–0.07)
Basophils Absolute: 0 10*3/uL (ref 0.0–0.1)
Basophils Relative: 0 %
Eosinophils Absolute: 0.1 10*3/uL (ref 0.0–0.5)
Eosinophils Relative: 2 %
HCT: 35.6 % — ABNORMAL LOW (ref 36.0–46.0)
Hemoglobin: 10.7 g/dL — ABNORMAL LOW (ref 12.0–15.0)
Immature Granulocytes: 0 %
Lymphocytes Relative: 8 %
Lymphs Abs: 0.5 10*3/uL — ABNORMAL LOW (ref 0.7–4.0)
MCH: 29 pg (ref 26.0–34.0)
MCHC: 30.1 g/dL (ref 30.0–36.0)
MCV: 96.5 fL (ref 80.0–100.0)
Monocytes Absolute: 0.3 10*3/uL (ref 0.1–1.0)
Monocytes Relative: 5 %
Neutro Abs: 5.8 10*3/uL (ref 1.7–7.7)
Neutrophils Relative %: 85 %
Platelets: 196 10*3/uL (ref 150–400)
RBC: 3.69 MIL/uL — ABNORMAL LOW (ref 3.87–5.11)
RDW: 16.4 % — ABNORMAL HIGH (ref 11.5–15.5)
WBC: 6.8 10*3/uL (ref 4.0–10.5)
nRBC: 0 % (ref 0.0–0.2)

## 2023-04-05 LAB — BASIC METABOLIC PANEL
Anion gap: 14 (ref 5–15)
BUN: 20 mg/dL (ref 8–23)
CO2: 23 mmol/L (ref 22–32)
Calcium: 9 mg/dL (ref 8.9–10.3)
Chloride: 101 mmol/L (ref 98–111)
Creatinine, Ser: 0.85 mg/dL (ref 0.44–1.00)
GFR, Estimated: >60 mL/min (ref >60)
Glucose, Bld: 139 mg/dL — ABNORMAL HIGH (ref 70–99)
Potassium: 3.1 mmol/L — ABNORMAL LOW (ref 3.5–5.1)
Sodium: 138 mmol/L (ref 135–145)

## 2023-04-05 MED ORDER — ONDANSETRON HCL 4 MG PO TABS: 4.0000 mg | ORAL_TABLET | Freq: Four times a day (QID) | ORAL | Status: DC | PRN

## 2023-04-05 MED ORDER — ACETAMINOPHEN 325 MG PO TABS: 650.0000 mg | ORAL_TABLET | Freq: Four times a day (QID) | ORAL | Status: DC | PRN

## 2023-04-05 MED ORDER — HYDROCODONE-ACETAMINOPHEN 5-325 MG PO TABS
1.0000 | ORAL_TABLET | ORAL | Status: DC | PRN
  Administered 2023-04-06 – 2023-04-08 (×5): 2 via ORAL
  Filled 2023-04-05 (×5): qty 2

## 2023-04-05 MED ORDER — FLUTICASONE FUROATE-VILANTEROL 100-25 MCG/ACT IN AEPB
1.0000 | INHALATION_SPRAY | Freq: Every day | RESPIRATORY_TRACT | Status: DC
  Administered 2023-04-07 – 2023-04-08 (×2): 1 via RESPIRATORY_TRACT
  Filled 2023-04-05 (×2): qty 28

## 2023-04-05 MED ORDER — ACETAMINOPHEN 650 MG RE SUPP: 650.0000 mg | Freq: Four times a day (QID) | RECTAL | Status: DC | PRN

## 2023-04-05 MED ORDER — MORPHINE SULFATE (PF) 2 MG/ML IV SOLN
2.0000 mg | INTRAVENOUS | Status: DC | PRN
  Administered 2023-04-08: 2 mg via INTRAVENOUS
  Filled 2023-04-05: qty 1

## 2023-04-05 MED ORDER — PANTOPRAZOLE SODIUM 40 MG PO TBEC
40.0000 mg | DELAYED_RELEASE_TABLET | Freq: Two times a day (BID) | ORAL | Status: DC
  Administered 2023-04-06 – 2023-04-08 (×6): 40 mg via ORAL
  Filled 2023-04-05 (×6): qty 1

## 2023-04-05 MED ORDER — SODIUM CHLORIDE 0.9 % IV SOLN
1.0000 g | INTRAVENOUS | Status: DC
  Administered 2023-04-06 – 2023-04-07 (×3): 1 g via INTRAVENOUS
  Filled 2023-04-05 (×3): qty 10

## 2023-04-05 MED ORDER — ROSUVASTATIN CALCIUM 10 MG PO TABS
10.0000 mg | ORAL_TABLET | Freq: Every day | ORAL | Status: DC
  Administered 2023-04-06 – 2023-04-08 (×3): 10 mg via ORAL
  Filled 2023-04-05 (×4): qty 1

## 2023-04-05 MED ORDER — ALBUTEROL SULFATE (2.5 MG/3ML) 0.083% IN NEBU
2.5000 mg | INHALATION_SOLUTION | Freq: Four times a day (QID) | RESPIRATORY_TRACT | Status: DC | PRN
  Administered 2023-04-08: 2.5 mg via RESPIRATORY_TRACT
  Filled 2023-04-05: qty 3

## 2023-04-05 MED ORDER — ONDANSETRON HCL 4 MG/2ML IJ SOLN: 4.0000 mg | Freq: Four times a day (QID) | INTRAMUSCULAR | Status: DC | PRN

## 2023-04-05 MED ORDER — HYDROCODONE-ACETAMINOPHEN 5-325 MG PO TABS
1.0000 | ORAL_TABLET | Freq: Once | ORAL | Status: AC
  Administered 2023-04-05: 1 via ORAL
  Filled 2023-04-05: qty 1

## 2023-04-05 NOTE — ED Notes (Addendum)
Patient states she is usually incontinent and doesn't always know when she is going to void.

## 2023-04-05 NOTE — ED Notes (Signed)
Report given to Tom RN.

## 2023-04-05 NOTE — ED Triage Notes (Signed)
Pt states no urine production from left nephrostomy tube since Thursday morning; States she has been urinating though. Complains of left flank pain that started yesterday evening.

## 2023-04-05 NOTE — Assessment & Plan Note (Signed)
Oral potassium repletion and monitor 

## 2023-04-05 NOTE — ED Provider Notes (Signed)
[facility] Provider Note    Event Date/Time   First MD Initiated Contact with Patient 04/05/23 1541     (approximate)   History   Decreased urine production   HPI  Courtney Mack is a 67 y.o. female PMH of diabetes, hypertension, blood clots, CHF, obesity and hydronephrosis with nephrostomy tube in place.  Patient reports that she has had no urine output from the nephrostomy tube in 2 days.  She has been urinating and states that she has some burning with urination.  She also endorses pain at the nephrostomy tube site.  She denies any fevers.  Patient has had the tube in place for over a year.  She does not feel like it has pulled out of place.      Physical Exam   Triage Vital Signs: ED Triage Vitals  Encounter Vitals Group     BP 04/05/23 1505 117/61     Systolic BP Percentile --      Diastolic BP Percentile --      Pulse Rate 04/05/23 1505 92     Resp 04/05/23 1505 19     Temp 04/05/23 1505 99.4 F (37.4 C)     Temp Source 04/05/23 1505 Oral     SpO2 04/05/23 1505 99 %     Weight 04/05/23 1500 195 lb (88.5 kg)     Height 04/05/23 1500 4\' 11"  (1.499 m)     Head Circumference --      Peak Flow --      Pain Score 04/05/23 1459 6     Pain Loc --      Pain Education --      Exclude from Growth Chart --     Most recent vital signs: Vitals:   04/05/23 1505 04/05/23 2018  BP: 117/61 (!) 142/64  Pulse: 92 82  Resp: 19 18  Temp: 99.4 F (37.4 C)   SpO2: 99% 97%    General: Awake, no distress.  CV:  Good peripheral perfusion.  RRR. Resp:  Normal effort.  CTAB. Abd:  No distention.  Tender to palpation around the nephrostomy tube site.  No erythema or swelling on the surrounding skin.   ED Results / Procedures / Treatments   Labs (all labs ordered are listed, but only abnormal results are displayed) Labs Reviewed  CBC WITH DIFFERENTIAL/PLATELET - Abnormal; Notable for the following components:      Result Value   RBC 3.69 (*)     Hemoglobin 10.7 (*)    HCT 35.6 (*)    RDW 16.4 (*)    Lymphs Abs 0.5 (*)    All other components within normal limits  BASIC METABOLIC PANEL - Abnormal; Notable for the following components:   Potassium 3.1 (*)    Glucose, Bld 139 (*)    All other components within normal limits  URINALYSIS, ROUTINE W REFLEX MICROSCOPIC - Abnormal; Notable for the following components:   Color, Urine YELLOW (*)    APPearance CLOUDY (*)    Hgb urine dipstick SMALL (*)    Protein, ur 30 (*)    Leukocytes,Ua LARGE (*)    Bacteria, UA MANY (*)    All other components within normal limits  BASIC METABOLIC PANEL  CBC   RADIOLOGY  CT renal stone study obtained to evaluate for positioning of nephrostomy tube.  Interpreted the images as well as reviewed the radiologist report.  Nephrostomy tube is still appropriately in place.  1. Interval development of mild-to-moderate  left-sided  hydronephrosis and hydroureter without evidence of ureteral calculus  or perinephric fluid collection. The indwelling left percutaneous  nephrostomy tube appears unchanged in position but may be  dysfunctional based on reported limited output. Correlate  clinically.  2. Unchanged nonobstructing right renal calculus.  3. Unchanged mild bladder wall thickening and perivesical soft  tissue stranding, suboptimally evaluated due to incomplete bladder  distension.  4. Stable mildly prominent retroperitoneal lymph nodes, likely  reactive to the indwelling nephrostomy tube.  5.  Aortic Atherosclerosis (ICD10-I70.0).   PROCEDURES:  Critical Care performed: No  Procedures   MEDICATIONS ORDERED IN ED: Medications  cefTRIAXone (ROCEPHIN) 1 g in sodium chloride 0.9 % 100 mL IVPB (has no administration in time range)  rosuvastatin (CRESTOR) tablet 10 mg (has no administration in time range)  pantoprazole (PROTONIX) EC tablet 40 mg (has no administration in time range)  albuterol (PROVENTIL) (2.5 MG/3ML) 0.083% nebulizer  solution 2.5 mg (has no administration in time range)  fluticasone furoate-vilanterol (BREO ELLIPTA) 100-25 MCG/ACT 1 puff (has no administration in time range)  acetaminophen (TYLENOL) tablet 650 mg (has no administration in time range)    Or  acetaminophen (TYLENOL) suppository 650 mg (has no administration in time range)  ondansetron (ZOFRAN) tablet 4 mg (has no administration in time range)    Or  ondansetron (ZOFRAN) injection 4 mg (has no administration in time range)  HYDROcodone-acetaminophen (NORCO/VICODIN) 5-325 MG per tablet 1-2 tablet (has no administration in time range)  morphine (PF) 2 MG/ML injection 2 mg (has no administration in time range)  HYDROcodone-acetaminophen (NORCO/VICODIN) 5-325 MG per tablet 1 tablet (1 tablet Oral Given 04/05/23 2022)     IMPRESSION / MDM / ASSESSMENT AND PLAN / ED COURSE  I reviewed the triage vital signs and the nursing notes.                             67 year old female presents to the ED because she has not had any urine output from her left nephrostomy tube in the past 2 days.  Vital signs stable in triage and patient NAD on exam.  Differential diagnosis includes, but is not limited to, UTI, pyelonephritis, nephrostomy tube displacement, nephrostomy tube dysfunction.  Patient's presentation is most consistent with acute complicated illness / injury requiring diagnostic workup.  BMP shows mild hypokalemia, CBC shows mild anemia.  Urinalysis showed small hemoglobin, protein, large leukocytes, white blood cells and many bacteria.  CT renal stone study obtained, I interpreted the images as well as reviewed the radiologist report which confirmed that the nephrostomy tube was still in place.  I spoke with the on-call neurologist who advised [place] to try to flush the tube given that it is still in place.  Nursing staff was able to flush the nephrostomy tube but this did cause patient some pain.  About an hour after the nephrostomy tube was  flushed she still did not have any output in the bag, so I called nephrology again.  They recommended that I get in touch with IR to see if they could replace the patient's nephrostomy tube.  I spoke with the on-call interventional radiologist who did agree that patient's nephrostomy tube should be replaced.  He recommended that she be admitted to the hospital to have the procedure completed tomorrow.  Patient was started on IV Rocephin for treatment of her UTI.  Hospitalist was consulted who was agreeable to admit.  Patient was given Norco to address  her pain while in the ED.  Patient was stable at the time of admission.      FINAL CLINICAL IMPRESSION(S) / ED DIAGNOSES   Final diagnoses:  Urinary tract infection with hematuria, site unspecified  Malfunction of nephrostomy tube (HCC)     Rx / DC Orders   ED Discharge Orders     None        Note:  This document was prepared using Dragon voice recognition software and may include unintentional dictation errors.   Cameron Ali, PA-C 04/05/23 2325    Sharman Cheek, MD 04/05/23 2350

## 2023-04-05 NOTE — ED Notes (Signed)
AC called again and said she would come down shortly to flush the tube. PA-C aware.

## 2023-04-05 NOTE — Assessment & Plan Note (Signed)
Secondary to blocked nephrostomy tube and hydronephrosis IV Rocephin Follow cultures

## 2023-04-05 NOTE — Assessment & Plan Note (Signed)
No acute issues suspected 

## 2023-04-05 NOTE — H&P (Signed)
History and Physical    Patient: Courtney Mack UJW:[id number] DOB: 1956/01/27 DOA: 04/05/2023 DOS: the patient was seen and examined on 04/05/2023 PCP: [facility]  Patient coming from: Home  Chief Complaint:  Chief Complaint  Patient presents with   Decreased urine production    HPI: Courtney Mack is a 67 y.o. female with medical history significant for left-sided hydronephrosis status post nephrostomy tube placement, hypertension, COPD, history of extensive bilateral PE , diastolic CHF, who presents to the emergency department for chief concerns left flank pain and minimal cloudy output from left nephrostomy tube.  Patient was hospitalized from 8/5 to 8/7 with [facility].    Presently she denies nausea vomiting, fever or chills ED No course and data review: Vitals within normal limits. Labs: WBC 6800, hemoglobin 10.7.  BMP significant for potassium of 3.1. Urinalysis with cloudy urine, large leukocytes and many bacteria CT renal stone study shows interval development of mild to moderate left-sided hydronephrosis as follows:   IMPRESSION: 1. Interval development of mild-to-moderate left-sided hydronephrosis and hydroureter without evidence of ureteral calculus or perinephric fluid collection. The indwelling left percutaneous nephrostomy tube appears unchanged in position but may be dysfunctional based on reported limited output. Correlate clinically.  The ED provider spoke with urology who recommended irrigating the tube.  After irrigating in the ED there was no flow back into the bag so they recommended hospitalization for IR to change the drain in the a.m. Patient started on Rocephin, given hydrocodone for pain and hospitalist consulted for admission.  Review of Systems: As mentioned in the history of present illness. All other systems reviewed and are negative.  Past Medical History:  Diagnosis Date   Acute bilateral deep vein  thrombosis (DVT) of femoral veins (HCC) 01/02/2021   Acute massive pulmonary embolism (HCC) 08/25/2020   Last Assessment & Plan:  Formatting of this note might be different from the original. Post vena caval filter and on xarelto and O2   Anemia    ARF (acute respiratory failure) (HCC)    Cellulitis of left lower leg 06/20/2021   CHF (congestive heart failure) (HCC)    COVID-19    Diabetes mellitus without complication (HCC)    DVT (deep venous thrombosis) (HCC) 09/06/2020   Endometrial cancer (HCC)    GAVE (gastric antral vascular ectasia)    GERD (gastroesophageal reflux disease)    High cholesterol    Hx of blood clots    Hyperlipidemia    Hypertension    IDA (iron deficiency anemia) 09/21/2020   Obesity    Pneumonia 06/11/2022   recovered   Pressure injury of skin 06/21/2021   Pulmonary embolism (HCC)    Sepsis (HCC)    Thrombocytopenia (HCC)    Urinary tract infection 09/13/2021   Past Surgical History:  Procedure Laterality Date   CATARACT EXTRACTION W/PHACO Right 06/04/2022   Procedure: CATARACT EXTRACTION PHACO AND INTRAOCULAR LENS PLACEMENT (IOC) COMPLICATED RIGHT  31.83  02:17.4;  Surgeon: Galen Manila, MD;  Location: [facility];  Service: Ophthalmology;  Laterality: Right;   CATARACT EXTRACTION W/PHACO Left 07/09/2022   Procedure: CATARACT EXTRACTION PHACO AND INTRAOCULAR LENS PLACEMENT (IOC);  Surgeon: Galen Manila, MD;  Location: [facility];  Service: Ophthalmology;  Laterality: Left;  22.75 1:40.3   COLONOSCOPY N/A 06/21/2021   Procedure: COLONOSCOPY;  Surgeon: Toledo, Boykin Nearing, MD;  Location: [facility] ENDOSCOPY;  Service: Gastroenterology;  Laterality: N/A;   CYSTOSCOPY W/ URETERAL STENT PLACEMENT Left 09/11/2021   Procedure: CYSTOSCOPY  WITH RETROGRADE PYELOGRAM/URETERAL STENT PLACEMENT;  Surgeon: Riki Altes, MD;  Location: [facility] ORS;  Service: Urology;  Laterality: Left;   CYSTOSCOPY W/ URETERAL STENT PLACEMENT Left 11/17/2021    Procedure: CYSTOSCOPY WITH STENT REPLACEMENT;  Surgeon: Bjorn Pippin, MD;  Location: [facility] ORS;  Service: Urology;  Laterality: Left;   CYSTOSCOPY WITH FULGERATION  11/17/2021   Procedure: CYSTOSCOPY WITH FULGERATION;  Surgeon: Bjorn Pippin, MD;  Location: [facility] OR[facility];  Service: Urology;;   CYSTOSCOPY WITH STENT PLACEMENT Left 10/17/2021   Procedure: CYSTOSCOPY WITH STENT PLACEMENT;  Surgeon: Riki Altes, MD;  Location: [facility] ORS;  Service: Urology;  Laterality: Left;   CYSTOSCOPY WITH URETEROSCOPY, STONE BASKETRY AND STENT PLACEMENT Left 10/16/2021   Procedure: CYSTOSCOPY WITH URETEROSCOPY  AND STENT REMOVAL;  Surgeon: Riki Altes, MD;  Location: [facility] OR[facility];  Service: Urology;  Laterality: Left;   ENTEROSCOPY N/A 08/17/2021   Procedure: ENTEROSCOPY;  Surgeon: Toney Reil, MD;  Location: [facility] ENDOSCOPY;  Service: Gastroenterology;  Laterality: N/A;   ESOPHAGOGASTRODUODENOSCOPY N/A 06/21/2021   Procedure: ESOPHAGOGASTRODUODENOSCOPY (EGD);  Surgeon: Toledo, Boykin Nearing, MD;  Location: [facility] END[facility];  Service: Gastroenterology;  Laterality: N/A;   ESOPHAGOGASTRODUODENOSCOPY (EGD) WITH PROPOFOL N/A 08/17/2021   Procedure: ESOPHAGOGASTRODUODENOSCOPY (EGD) WITH PROPOFOL;  Surgeon: Toney Reil, MD;  Location: [facility] ENDOSCOPY;  Service: Gastroenterology;  Laterality: N/A;   ESOPHAGOGASTRODUODENOSCOPY (EGD) WITH PROPOFOL N/A 10/14/2021   Procedure: ESOPHAGOGASTRODUODENOSCOPY (EGD) WITH PROPOFOL;  Surgeon: Midge Minium, MD;  Location: [facility] ENDOSCOPY;  Service: Endoscopy;  Laterality: N/A;   ESOPHAGOGASTRODUODENOSCOPY (EGD) WITH PROPOFOL N/A 02/01/2022   Procedure: ESOPHAGOGASTRODUODENOSCOPY (EGD) WITH PROPOFOL;  Surgeon: Toney Reil, MD;  Location: [facility] ENDOSCOPY;  Service: Gastroenterology;  Laterality: N/A;   ESOPHAGOGASTRODUODENOSCOPY (EGD) WITH PROPOFOL N/A 03/15/2022   Procedure: ESOPHAGOGASTRODUODENOSCOPY (EGD) WITH PROPOFOL;  Surgeon: Wyline Mood, MD;  Location: [facility] ENDOSCOPY;  Service:  Gastroenterology;  Laterality: N/A;   ESOPHAGOGASTRODUODENOSCOPY (EGD) WITH PROPOFOL N/A 04/18/2022   Procedure: ESOPHAGOGASTRODUODENOSCOPY (EGD) WITH PROPOFOL;  Surgeon: Toney Reil, MD;  Location: [facility] ENDOSCOPY;  Service: Gastroenterology;  Laterality: N/A;   ESOPHAGOGASTRODUODENOSCOPY (EGD) WITH PROPOFOL N/A 08/09/2022   Procedure: ESOPHAGOGASTRODUODENOSCOPY (EGD) WITH PROPOFOL;  Surgeon: Wyline Mood, MD;  Location: [facility] ENDOSCOPY;  Service: Gastroenterology;  Laterality: N/A;   GIVENS CAPSULE STUDY N/A 06/22/2021   Procedure: GIVENS CAPSULE STUDY;  Surgeon: Toledo, Boykin Nearing, MD;  Location: [facility] ENDOSCOPY;  Service: Gastroenterology;  Laterality: N/A;   IR NEPHROSTOMY EXCHANGE LEFT  02/25/2022   IR NEPHROSTOMY EXCHANGE LEFT  03/27/2022   IR NEPHROSTOMY EXCHANGE LEFT  05/22/2022   IR NEPHROSTOMY EXCHANGE LEFT  07/24/2022   IR NEPHROSTOMY EXCHANGE LEFT  09/24/2022   IR NEPHROSTOMY EXCHANGE LEFT  11/21/2022   IR NEPHROSTOMY EXCHANGE LEFT  02/17/2023   IR NEPHROSTOMY PLACEMENT LEFT  12/27/2021   IVC FILTER INSERTION N/A 08/18/2020   Procedure: IVC FILTER INSERTION;  Surgeon: Renford Dills, MD;  Location: [facility] INVASIVE CV LAB;  Service: Cardiovascular;  Laterality: N/A;   PERIPHERAL VASCULAR THROMBECTOMY Bilateral 01/03/2021   Procedure: PERIPHERAL VASCULAR THROMBECTOMY;  Surgeon: Annice Needy, MD;  Location: [facility] INV[facility] CV LAB;  Service: Cardiovascular;  Laterality: Bilateral;   PULMONARY THROMBECTOMY N/A 08/18/2020   Procedure: PULMONARY THROMBECTOMY;  Surgeon: Renford Dills, MD;  Location: [facility];  Service: Cardiovascular;  Laterality: N/A;   TUBAL LIGATION     VISCERAL ANGIOGRAPHY N/A 07/18/2021   Procedure: VISCERAL ANGIOGRAPHY;  Surgeon: Annice Needy, MD;  Location: [facility];  Service: Cardiovascular;  Laterality:  N/A;   Social History:  reports that she has never smoked. She has never used smokeless tobacco. She reports that she does not currently use  alcohol. She reports that she does not use drugs.  No Known Allergies  Family History  Problem Relation Age of Onset   Hypertension Mother    Diabetes Mother    Diabetes Father    Hypertension Father    High Cholesterol Father    Congestive Heart Failure Father    Breast cancer Cousin     Prior to Admission medications   Medication Sig Start Date End Date Taking? Authorizing Provider  acetaminophen (TYLENOL) 500 MG tablet Take 500 mg by mouth every 6 (six) hours as needed for mild pain or fever.    [provider]  albuterol (VENTOLIN HFA) 108 (90 Base) MCG/ACT inhaler Inhale 2 puffs into the lungs every 6 (six) hours as needed for wheezing or shortness of breath. 10/26/22   Marrion Coy, MD  Ascorbic Acid (VITAMIN C) 1000 MG tablet Take 1,000 mg by mouth daily.    [provider]  FEROSUL 325 (65 Fe) MG tablet Take 325 mg by mouth daily with breakfast. 08/14/22   [provider]  fluticasone furoate-vilanterol (BREO ELLIPTA) 100-25 MCG/ACT AEPB Inhale 1 puff into the lungs daily.    [provider]  furosemide (LASIX) 20 MG tablet Take 1 tablet (20 mg total) by mouth daily. And additional 20mg  if needed 01/21/23   Delma Freeze, FNP  loperamide (IMODIUM) 2 MG capsule Take 2 mg by mouth as needed for diarrhea or loose stools. 09/27/22   [provider]  midodrine (PROAMATINE) 5 MG tablet Take 1 tablet (5 mg total) by mouth 3 (three) times daily with meals. 02/12/23   Arnetha Courser, MD  Multiple Vitamin (MULTIVITAMIN WITH MINERALS) TABS tablet Take 1 tablet by mouth daily. 09/12/20   Standley Brooking, MD  nystatin (MYCOSTATIN/NYSTOP) powder Apply 1 Application topically 4 (four) times daily as needed. 02/03/23   [provider]  pantoprazole (PROTONIX) 40 MG tablet Take 1 tablet (40 mg total) by mouth 2 (two) times daily. 02/12/23 04/13/23  Arnetha Courser, MD  rosuvastatin (CRESTOR) 10 MG tablet Take 1 tablet (10 mg total) by mouth daily. 01/21/23    Delma Freeze, FNP  triamcinolone cream (KENALOG) 0.1 % Apply 1 Application topically 2 (two) times daily as needed. 12/04/22   [provider]    Physical Exam: Vitals:   04/05/23 1500 04/05/23 1505 04/05/23 2018  BP:  117/61 (!) 142/64  Pulse:  92 82  Resp:  19 18  Temp:  99.4 F (37.4 C)   TempSrc:  Oral   SpO2:  99% 97%  Weight: 88.5 kg    Height: 4\' 11"  (1.499 m)     Physical Exam  Labs on Admission: I have personally reviewed following labs and imaging studies  CBC: Recent Labs  Lab 04/05/23 1648  WBC 6.8  NEUTROABS 5.8  HGB 10.7*  HCT 35.6*  MCV 96.5  PLT 196   Basic Metabolic Panel: Recent Labs  Lab 04/05/23 1648  NA 138  K 3.1*  CL 101  CO2 23  GLUCOSE 139*  BUN 20  CREATININE 0.85  CALCIUM 9.0   GFR: Estimated Creatinine Clearance: 61.3 mL/min (by C-G formula based on SCr of 0.85 mg/dL). Liver Function Tests: No results for input(s): "AST", "ALT", "ALKPHOS", "BILITOT", "PROT", "ALBUMIN" in the last 168 hours. No results for input(s): "LIPASE", "AMYLASE" in the last  168 hours. No results for input(s): "AMMONIA" in the last 168 hours. Coagulation Profile: No results for input(s): "INR", "PROTIME" in the last 168 hours. Cardiac Enzymes: No results for input(s): "CKTOTAL", "CKMB", "CKMBINDEX", "TROPONINI" in the last 168 hours. BNP (last 3 results) No results for input(s): "PROBNP" in the last 8760 hours. HbA1C: No results for input(s): "HGBA1C" in the last 72 hours. CBG: No results for input(s): "GLUCAP" in the last 168 hours. Lipid Profile: No results for input(s): "CHOL", "HDL", "LDLCALC", "TRIG", "CHOLHDL", "LDLDIRECT" in the last 72 hours. Thyroid Function Tests: No results for input(s): "TSH", "T4TOTAL", "FREET4", "T3FREE", "THYROIDAB" in the last 72 hours. Anemia Panel: No results for input(s): "VITAMINB12", "FOLATE", "FERRITIN", "TIBC", "IRON", "RETICCTPCT" in the last 72 hours. Urine analysis:    Component Value Date/Time    COLORURINE YELLOW (A) 02/10/2023 1152   APPEARANCEUR TURBID (A) 02/10/2023 1152   APPEARANCEUR Cloudy (A) 01/03/2022 1443   LABSPEC 1.016 02/10/2023 1152   PHURINE 8.0 02/10/2023 1152   GLUCOSEU NEGATIVE 02/10/2023 1152   HGBUR SMALL (A) 02/10/2023 1152   BILIRUBINUR NEGATIVE 02/10/2023 1152   BILIRUBINUR Negative 01/03/2022 1443   KETONESUR NEGATIVE 02/10/2023 1152   PROTEINUR >=300 (A) 02/10/2023 1152   NITRITE NEGATIVE 02/10/2023 1152   LEUKOCYTESUR MODERATE (A) 02/10/2023 1152    Radiological Exams on Admission: CT Renal Stone Study  Result Date: 04/05/2023 CLINICAL DATA:  Nephrostomy catheter displacement. No urine output from left nephrostomy tube for 2 days. Left flank pain. EXAM: CT ABDOMEN AND PELVIS WITHOUT CONTRAST TECHNIQUE: Multidetector CT imaging of the abdomen and pelvis was performed following the standard protocol without IV contrast. RADIATION DOSE REDUCTION: This exam was performed according to the departmental dose-optimization program which includes automated exposure control, adjustment of the mA and/or kV according to patient size and/or use of iterative reconstruction technique. COMPARISON:  Abdominopelvic CT 11/20/2022 and 03/26/2022. FINDINGS: Lower chest: Mild atelectasis at both lung bases. Mild aortic atherosclerosis. No significant pleural or pericardial effusion. Hepatobiliary: The liver is normal in density without suspicious focal abnormality. The liver appears unremarkable as imaged in the noncontrast state. Pancreas: Generalized atrophy. No pancreatic ductal dilatation, focal lesion or surrounding inflammation. Spleen: Normal in size without focal abnormality. Adrenals/Urinary Tract: Both adrenal glands appear normal. Left percutaneous nephrostomy tube appears unchanged within the left renal pelvis. There is no perinephric fluid collection. However, the patient has developed mild to moderate left-sided hydronephrosis and hydroureter. No evidence of ureteral  calculus. The right kidney appears unchanged, without hydronephrosis. There is a 3 mm nonobstructing calculus in the lower pole of the right kidney which is unchanged. The bladder is decompressed. As before, possible mild bladder wall thickening and perivesical soft tissue stranding, suboptimally evaluated due to incomplete distension. Stomach/Bowel: No enteric contrast administered. The stomach appears unremarkable for its degree of distension. No evidence of bowel wall thickening, distention or surrounding inflammatory change. The appendix appears normal. Vascular/Lymphatic: Mildly prominent retroperitoneal lymph nodes are unchanged, likely reactive to the indwelling nephrostomy tube. No progressive retroperitoneal lymphadenopathy. Mild aortic and branch vessel atherosclerosis without evidence of aneurysm. Infrarenal IVC filter appears unchanged in position. Reproductive: The uterus and ovaries appear unremarkable. No adnexal mass. Other: No evidence of abdominal wall mass or hernia. No ascites or pneumoperitoneum. Musculoskeletal: No acute osseous findings. Severe degenerative changes at both hips are again noted. Mild generalized subcutaneous edema. Unless specific follow-up recommendations are mentioned in the findings or impression sections, no imaging follow-up of any mentioned incidental findings is recommended. IMPRESSION: 1. Interval development of  mild-to-moderate left-sided hydronephrosis and hydroureter without evidence of ureteral calculus or perinephric fluid collection. The indwelling left percutaneous nephrostomy tube appears unchanged in position but may be dysfunctional based on reported limited output. Correlate clinically. 2. Unchanged nonobstructing right renal calculus. 3. Unchanged mild bladder wall thickening and perivesical soft tissue stranding, suboptimally evaluated due to incomplete bladder distension. 4. Stable mildly prominent retroperitoneal lymph nodes, likely reactive to the  indwelling nephrostomy tube. 5.  Aortic Atherosclerosis (ICD10-I70.0). Electronically Signed   By: Carey Bullocks M.D.   On: 04/05/2023 16:44     Data Reviewed: Relevant notes from primary care and specialist visits, past discharge summaries as available in EHR, including Care Everywhere. Prior diagnostic testing as pertinent to current admission diagnoses Updated medications and problem lists for reconciliation ED course, including vitals, labs, imaging, treatment and response to treatment Triage notes, nursing and pharmacy notes and ED provider's notes Notable results as noted in HPI   Assessment and Plan: * Blocked nephrostomy tube with hydronephrosis and hydroureter (HCC) Patient with cloudy and decreased nephrostomy output Urology was consulted from the ED and have been formally consulted with recommendation for IR consult for nephrostomy tube exchange N.p.o. from midnight Formal IR consult placed  Complicated urinary tract infection Secondary to blocked nephrostomy tube and hydronephrosis IV Rocephin Follow cultures  Hypokalemia Oral potassium repletion and monitor  Obesity, Class III, BMI 40-49.9 (morbid obesity) (HCC) Complicating factor to overall prognosis and care  Endometrial cancer (HCC) No acute issues suspected  Chronic obstructive pulmonary disease (COPD) (HCC) Not acutely exacerbated Continue home inhalers with DuoNebs as needed  History of pulmonary embolus (PE) History of DVT s/p IVC filter No acute issues suspected    DVT prophylaxis: Lovenox  Consults: urology and IR  Advance Care Planning:   Code Status: Prior   Family Communication: none  Disposition Plan: Back to previous home environment  Severity of Illness: The appropriate patient status for this patient is INPATIENT. Inpatient status is judged to be reasonable and necessary in order to provide the required intensity of service to ensure the patient's safety. The patient's presenting  symptoms, physical exam findings, and initial radiographic and laboratory data in the context of their chronic comorbidities is felt to place them at high risk for further clinical deterioration. Furthermore, it is not anticipated that the patient will be medically stable for discharge from the hospital within 2 midnights of admission.   * I certify that at the point of admission it is my clinical judgment that the patient will require inpatient hospital care spanning beyond 2 midnights from the point of admission due to high intensity of service, high risk for further deterioration and high frequency of surveillance required.*  Author: Andris Baumann, MD 04/05/2023 9:53 PM  For on call review [email or web address].

## 2023-04-05 NOTE — ED Notes (Addendum)
Patient was placed on a purewick due to incontinence and nephrosotomy tube. Pericare performed prior to [facility] placement. Scant amount of urine noted in nephrostomy tube. Tom RN at bedside. Patient had difficulty opening legs to place purewick.

## 2023-04-05 NOTE — ED Notes (Signed)
No urine noted in nephrostomy bag. Patient denies a gradual decrease in volume, but did state there was more "gunk" than normal.

## 2023-04-05 NOTE — Assessment & Plan Note (Signed)
Not acutely exacerbated ?Continue home inhalers with DuoNebs as needed ?

## 2023-04-05 NOTE — ED Notes (Signed)
Maisie Fus RN, [facility] RN came to room and flushed the nephrostomy tube. Patient states it was "tender", but tolerated the procedure well. Thomas RN states urine was flowing into the syringe well, but will need to watch if it flows into the bag. Lauren PA-C aware.

## 2023-04-05 NOTE — Assessment & Plan Note (Signed)
History of DVT s/p IVC filter No acute issues suspected

## 2023-04-05 NOTE — ED Notes (Signed)
AC called for RN from floor to flush nephrostomy.

## 2023-04-05 NOTE — Assessment & Plan Note (Signed)
Complicating factor to overall prognosis and care 

## 2023-04-05 NOTE — Assessment & Plan Note (Addendum)
Patient with cloudy and decreased nephrostomy output Urology was consulted from the ED and have been formally consulted with recommendation for IR consult for nephrostomy tube exchange N.p.o. from midnight Formal IR consult placed

## 2023-04-06 ENCOUNTER — Inpatient Hospital Stay: Payer: Medicare HMO | Admitting: Radiology

## 2023-04-06 DIAGNOSIS — N99528 Other complication of other external stoma of urinary tract: Secondary | ICD-10-CM | POA: Diagnosis not present

## 2023-04-06 HISTORY — PX: IR NEPHROSTOMY EXCHANGE LEFT: IMG6069

## 2023-04-06 LAB — CBC
HCT: 33.7 % — ABNORMAL LOW (ref 36.0–46.0)
Hemoglobin: 10.1 g/dL — ABNORMAL LOW (ref 12.0–15.0)
MCH: 28.7 pg (ref 26.0–34.0)
MCHC: 30 g/dL (ref 30.0–36.0)
MCV: 95.7 fL (ref 80.0–100.0)
Platelets: 231 10*3/uL (ref 150–400)
RBC: 3.52 MIL/uL — ABNORMAL LOW (ref 3.87–5.11)
RDW: 16.5 % — ABNORMAL HIGH (ref 11.5–15.5)
WBC: 6.1 10*3/uL (ref 4.0–10.5)
nRBC: 0 % (ref 0.0–0.2)

## 2023-04-06 LAB — BASIC METABOLIC PANEL
Anion gap: 10 (ref 5–15)
BUN: 20 mg/dL (ref 8–23)
CO2: 28 mmol/L (ref 22–32)
Calcium: 8.8 mg/dL — ABNORMAL LOW (ref 8.9–10.3)
Chloride: 100 mmol/L (ref 98–111)
Creatinine, Ser: 0.86 mg/dL (ref 0.44–1.00)
GFR, Estimated: >60 mL/min (ref >60)
Glucose, Bld: 139 mg/dL — ABNORMAL HIGH (ref 70–99)
Potassium: 3.7 mmol/L (ref 3.5–5.1)
Sodium: 138 mmol/L (ref 135–145)

## 2023-04-06 LAB — SARS CORONAVIRUS 2 BY RT PCR: SARS Coronavirus 2 by RT PCR: NEGATIVE

## 2023-04-06 MED ORDER — LIDOCAINE HCL 1 % IJ SOLN
INTRAMUSCULAR | Status: AC
  Filled 2023-04-06: qty 20

## 2023-04-06 MED ORDER — FUROSEMIDE 40 MG PO TABS
20.0000 mg | ORAL_TABLET | Freq: Every day | ORAL | Status: DC
  Administered 2023-04-06: 20 mg via ORAL
  Filled 2023-04-06: qty 1

## 2023-04-06 MED ORDER — IOHEXOL 300 MG/ML  SOLN
8.0000 mL | Freq: Once | INTRAMUSCULAR | Status: AC | PRN
  Administered 2023-04-06: 8 mL

## 2023-04-06 MED ORDER — SODIUM CHLORIDE 0.9 % IV SOLN: INTRAVENOUS | Status: DC | PRN

## 2023-04-06 MED ORDER — MIDODRINE HCL 5 MG PO TABS
5.0000 mg | ORAL_TABLET | Freq: Three times a day (TID) | ORAL | Status: DC
  Administered 2023-04-06 – 2023-04-08 (×7): 5 mg via ORAL
  Filled 2023-04-06 (×7): qty 1

## 2023-04-06 NOTE — ED Notes (Signed)
Per previous RN pt was stuck multiple times with no success. IV team order placed at 2238. Awaiting their arrival. Pt updated on poc.

## 2023-04-06 NOTE — ED Notes (Signed)
Pt still does not have a PIV at this time. Pt was stuck multiple times by ed staff with no success. Still awaiting iv team. Pt updated on poc.

## 2023-04-06 NOTE — Progress Notes (Signed)
PROGRESS NOTE    Courtney Mack  NWG:[id number] DOB: 05-Mar-1956 DOA: 04/05/2023 PCP: [facility]      Brief Narrative:   Courtney Mack is a 67 y.o. female with medical history significant for left-sided hydronephrosis status post nephrostomy tube placement, hypertension, COPD, history of extensive bilateral PE , diastolic CHF, hospitalized from 8/5 to 8/7 with [facility], who presents to the emergency department for chief concerns left flank pain and decreased output from left nephrostomy tube.  She reports that for several days prior to onset of the flank pain and decreased output she was seeing a lot of debris in the drainage bag.    Presently she denies nausea vomiting, fever or chills   The ED provider spoke with urology who recommended irrigating the tube.  After irrigating in the ED there was no flow back into the bag so they recommended hospitalization for IR to change the drain in the a.m. Patient started on Rocephin, given hydrocodone for pain and hospitalist consulted for admission.  Assessment & Plan:   Principal Problem:   Blocked nephrostomy tube with hydronephrosis and hydroureter (HCC) Active Problems:   Hydronephrosis of left kidney   Complicated urinary tract infection   Chronic diastolic CHF (congestive heart failure) (HCC)   Hypokalemia   Nephrostomy status (HCC)   Recurrent deep vein thrombosis (DVT) (HCC)   Obesity, Class III, BMI 40-49.9 (morbid obesity) (HCC)   Endometrial cancer (HCC)   History of pulmonary embolus (PE)   Chronic obstructive pulmonary disease (COPD) (HCC)  # Malfunctioning left nephrostomy tube Poor output at home, didn't flush properly here - IR consulted for exchange  # Cystitis Two days dysuria, urinalysis suggestive of infection, CT without signs obstruction, pyelo, or other infectious complication. No fevers or leukocytosis - continue ceftraixone - I've added on a urine culture (no culture  had been ordered)  # History endometrial cancer Treated, now on surveillance  # COPD Quiescent - home breo  # HFpEF Appears euvolemic - home daily lasix  # History recurrent GI bleed No report of bleeding, hgb stable from priors  # History DVT/PD S/p DVT filter, not anticoagulated 2/2 recurrent GI bleed  # Chronic hypotension BP stable - home midodrine  DVT prophylaxis: SCDs Code Status: full Family Communication: daughter Courtney Mack updated telephonically 9/29  Level of care: Med-Surg Status is: Inpatient Remains inpatient appropriate because: need for inpatient procedure    Consultants:  IR  Procedures: pending  Antimicrobials:  ceftriaxone    Subjective: Reports some mild flank pain, dysuria  Objective: Vitals:   04/05/23 2326 04/06/23 0000 04/06/23 0200 04/06/23 0800  BP: 110/65 (!) 102/46 (!) 97/48 (!) 100/49  Pulse: 78 91 80 85  Resp: 17  16   Temp: 98.4 F (36.9 C)  98.2 F (36.8 C)   TempSrc:      SpO2: 100% 98% 94% 90%  Weight:      Height:        Intake/Output Summary (Last 24 hours) at 04/06/2023 5784 Last data filed at 04/06/2023 0106 Gross per 24 hour  Intake 100 ml  Output --  Net 100 ml   Filed Weights   04/05/23 1500  Weight: 88.5 kg    Examination:  General exam: Appears calm and comfortable  Respiratory system: Clear to auscultation save for rales at bases Cardiovascular system: S1 & S2 heard, RRR. No JVD, murmurs, rubs, gallops or clicks.   Gastrointestinal system: Abdomen is nondistended, soft and nontender. No organomegaly  or masses felt. Normal bowel sounds heard. Central nervous system: Alert and oriented. No focal neurological deficits. Extremities: Symmetric 5 x 5 power. Mild LE edema Skin: No rashes, lesions or ulcers. Nephrostomy tube left flank Psychiatry: Judgement and insight appear normal. Mood & affect appropriate.     Data Reviewed: I have personally reviewed following labs and imaging  studies  CBC: Recent Labs  Lab 04/05/23 1648 04/06/23 0004  WBC 6.8 6.1  NEUTROABS 5.8  --   HGB 10.7* 10.1*  HCT 35.6* 33.7*  MCV 96.5 95.7  PLT 196 231   Basic Metabolic Panel: Recent Labs  Lab 04/05/23 1648 04/06/23 0004  NA 138 138  K 3.1* 3.7  CL 101 100  CO2 23 28  GLUCOSE 139* 139*  BUN 20 20  CREATININE 0.85 0.86  CALCIUM 9.0 8.8*   GFR: Estimated Creatinine Clearance: 60.6 mL/min (by C-G formula based on SCr of 0.86 mg/dL). Liver Function Tests: No results for input(s): "AST", "ALT", "ALKPHOS", "BILITOT", "PROT", "ALBUMIN" in the last 168 hours. No results for input(s): "LIPASE", "AMYLASE" in the last 168 hours. No results for input(s): "AMMONIA" in the last 168 hours. Coagulation Profile: No results for input(s): "INR", "PROTIME" in the last 168 hours. Cardiac Enzymes: No results for input(s): "CKTOTAL", "CKMB", "CKMBINDEX", "TROPONINI" in the last 168 hours. BNP (last 3 results) No results for input(s): "PROBNP" in the last 8760 hours. HbA1C: No results for input(s): "HGBA1C" in the last 72 hours. CBG: No results for input(s): "GLUCAP" in the last 168 hours. Lipid Profile: No results for input(s): "CHOL", "HDL", "LDLCALC", "TRIG", "CHOLHDL", "LDLDIRECT" in the last 72 hours. Thyroid Function Tests: No results for input(s): "TSH", "T4TOTAL", "FREET4", "T3FREE", "THYROIDAB" in the last 72 hours. Anemia Panel: No results for input(s): "VITAMINB12", "FOLATE", "FERRITIN", "TIBC", "IRON", "RETICCTPCT" in the last 72 hours. Urine analysis:    Component Value Date/Time   COLORURINE YELLOW (A) 04/05/2023 2155   APPEARANCEUR CLOUDY (A) 04/05/2023 2155   APPEARANCEUR Cloudy (A) 01/03/2022 1443   LABSPEC 1.013 04/05/2023 2155   PHURINE 5.0 04/05/2023 2155   GLUCOSEU NEGATIVE 04/05/2023 2155   HGBUR SMALL (A) 04/05/2023 2155   BILIRUBINUR NEGATIVE 04/05/2023 2155   BILIRUBINUR Negative 01/03/2022 1443   KETONESUR NEGATIVE 04/05/2023 2155   PROTEINUR 30 (A)  04/05/2023 2155   NITRITE NEGATIVE 04/05/2023 2155   LEUKOCYTESUR LARGE (A) 04/05/2023 2155   Sepsis Labs: @LABRCNTIP (procalcitonin:4,lacticidven:4)  )No results found for this or any previous visit (from the past 240 hour(s)).       Radiology Studies: CT Renal Stone Study  Result Date: 04/05/2023 CLINICAL DATA:  Nephrostomy catheter displacement. No urine output from left nephrostomy tube for 2 days. Left flank pain. EXAM: CT ABDOMEN AND PELVIS WITHOUT CONTRAST TECHNIQUE: Multidetector CT imaging of the abdomen and pelvis was performed following the standard protocol without IV contrast. RADIATION DOSE REDUCTION: This exam was performed according to the departmental dose-optimization program which includes automated exposure control, adjustment of the mA and/or kV according to patient size and/or use of iterative reconstruction technique. COMPARISON:  Abdominopelvic CT 11/20/2022 and 03/26/2022. FINDINGS: Lower chest: Mild atelectasis at both lung bases. Mild aortic atherosclerosis. No significant pleural or pericardial effusion. Hepatobiliary: The liver is normal in density without suspicious focal abnormality. The liver appears unremarkable as imaged in the noncontrast state. Pancreas: Generalized atrophy. No pancreatic ductal dilatation, focal lesion or surrounding inflammation. Spleen: Normal in size without focal abnormality. Adrenals/Urinary Tract: Both adrenal glands appear normal. Left percutaneous nephrostomy tube appears unchanged within  the left renal pelvis. There is no perinephric fluid collection. However, the patient has developed mild to moderate left-sided hydronephrosis and hydroureter. No evidence of ureteral calculus. The right kidney appears unchanged, without hydronephrosis. There is a 3 mm nonobstructing calculus in the lower pole of the right kidney which is unchanged. The bladder is decompressed. As before, possible mild bladder wall thickening and perivesical soft tissue  stranding, suboptimally evaluated due to incomplete distension. Stomach/Bowel: No enteric contrast administered. The stomach appears unremarkable for its degree of distension. No evidence of bowel wall thickening, distention or surrounding inflammatory change. The appendix appears normal. Vascular/Lymphatic: Mildly prominent retroperitoneal lymph nodes are unchanged, likely reactive to the indwelling nephrostomy tube. No progressive retroperitoneal lymphadenopathy. Mild aortic and branch vessel atherosclerosis without evidence of aneurysm. Infrarenal IVC filter appears unchanged in position. Reproductive: The uterus and ovaries appear unremarkable. No adnexal mass. Other: No evidence of abdominal wall mass or hernia. No ascites or pneumoperitoneum. Musculoskeletal: No acute osseous findings. Severe degenerative changes at both hips are again noted. Mild generalized subcutaneous edema. Unless specific follow-up recommendations are mentioned in the findings or impression sections, no imaging follow-up of any mentioned incidental findings is recommended. IMPRESSION: 1. Interval development of mild-to-moderate left-sided hydronephrosis and hydroureter without evidence of ureteral calculus or perinephric fluid collection. The indwelling left percutaneous nephrostomy tube appears unchanged in position but may be dysfunctional based on reported limited output. Correlate clinically. 2. Unchanged nonobstructing right renal calculus. 3. Unchanged mild bladder wall thickening and perivesical soft tissue stranding, suboptimally evaluated due to incomplete bladder distension. 4. Stable mildly prominent retroperitoneal lymph nodes, likely reactive to the indwelling nephrostomy tube. 5.  Aortic Atherosclerosis (ICD10-I70.0). Electronically Signed   By: Carey Bullocks M.D.   On: 04/05/2023 16:44        Scheduled Meds:  [START ON 04/07/2023] fluticasone furoate-vilanterol  1 puff Inhalation Daily   pantoprazole  40 mg Oral  BID   rosuvastatin  10 mg Oral Daily   Continuous Infusions:  cefTRIAXone (ROCEPHIN)  IV Stopped (04/06/23 0106)     LOS: 1 day     Silvano Bilis, MD [facility]   If 7PM-7AM, please contact night-coverage [email or web address] Password [facility] 04/06/2023, 8:23 AM

## 2023-04-06 NOTE — Plan of Care (Signed)

## 2023-04-06 NOTE — Addendum Note (Signed)
Encounter addended by: Meta Hatchet, RT on: 04/06/2023 10:26 AM  Actions taken: Imaging Exam ended

## 2023-04-06 NOTE — ED Notes (Signed)
No am labs at this time. RN that started piv drew and sent them.

## 2023-04-07 ENCOUNTER — Inpatient Hospital Stay: Payer: Medicare HMO

## 2023-04-07 DIAGNOSIS — N99528 Other complication of other external stoma of urinary tract: Secondary | ICD-10-CM | POA: Diagnosis not present

## 2023-04-07 LAB — BASIC METABOLIC PANEL
Anion gap: 10 (ref 5–15)
BUN: 25 mg/dL — ABNORMAL HIGH (ref 8–23)
CO2: 27 mmol/L (ref 22–32)
Calcium: 8.1 mg/dL — ABNORMAL LOW (ref 8.9–10.3)
Chloride: 102 mmol/L (ref 98–111)
Creatinine, Ser: 0.97 mg/dL (ref 0.44–1.00)
GFR, Estimated: >60 mL/min (ref >60)
Glucose, Bld: 123 mg/dL — ABNORMAL HIGH (ref 70–99)
Potassium: 3.4 mmol/L — ABNORMAL LOW (ref 3.5–5.1)
Sodium: 139 mmol/L (ref 135–145)

## 2023-04-07 LAB — CBC
HCT: 25.6 % — ABNORMAL LOW (ref 36.0–46.0)
Hemoglobin: 8.1 g/dL — ABNORMAL LOW (ref 12.0–15.0)
MCH: 29 pg (ref 26.0–34.0)
MCHC: 31.6 g/dL (ref 30.0–36.0)
MCV: 91.8 fL (ref 80.0–100.0)
Platelets: 194 10*3/uL (ref 150–400)
RBC: 2.79 MIL/uL — ABNORMAL LOW (ref 3.87–5.11)
RDW: 16.1 % — ABNORMAL HIGH (ref 11.5–15.5)
WBC: 5.4 10*3/uL (ref 4.0–10.5)
nRBC: 0 % (ref 0.0–0.2)

## 2023-04-07 MED ORDER — IOHEXOL 300 MG/ML  SOLN
100.0000 mL | Freq: Once | INTRAMUSCULAR | Status: AC | PRN
  Administered 2023-04-07: 100 mL via INTRAVENOUS

## 2023-04-07 NOTE — TOC Initial Note (Signed)
Transition of Care [facility]) - Initial/Assessment Note    Patient Details  Name: Courtney Mack MRN: [id number] Date of Birth: 01/18/1956  [facility] CM/SW Contact:    Chapman Fitch, RN Phone Number: 04/07/2023, 10:33 AM  Clinical Narrative:                   Pt admitted from home with blocked nephrostomy tube Caregiver Support: Lives with Daughter DME at Home: Dan Humphreys, [facility], [employer], and shower seat Transportation: Daughter provides transportation PCP: [facility]  Previous admission declined home health.  Has [facility] had [facility] in the past   Expected Discharge Plan: Home/Self Care     Patient Goals and CMS Choice            Expected Discharge Plan and Services                                              Prior Living Arrangements/Services                       Activities of Daily Living      Permission Sought/Granted                  Emotional Assessment              Admission diagnosis:  Malfunction of nephrostomy tube (HCC) [T83.098A] Nephrostomy complication (HCC) [D66.440] Urinary tract infection with hematuria, site unspecified [N39.0, R31.9] Patient Active Problem List   Diagnosis Date Noted   Blocked nephrostomy tube with hydronephrosis and hydroureter (HCC) 04/05/2023   Hypertension    Pressure injury of skin 02/11/2023   Severe sepsis with acute organ dysfunction (HCC) 02/10/2023   Sepsis due to gram-negative UTI (HCC) 11/21/2022   Acute pyelonephritis 11/21/2022   Dyspnea 11/21/2022   Chest pain 11/21/2022   Hypokalemia 10/25/2022   Chronic obstructive pulmonary disease (COPD) (HCC) 10/24/2022   Iron deficiency anemia 10/24/2022   Symptomatic anemia 08/08/2022   COPD (chronic obstructive pulmonary disease) (HCC) 08/07/2022   Sepsis due to pneumonia (HCC) 06/12/2022   Multifocal pneumonia 06/12/2022   GERD without esophagitis 06/12/2022   Generalized weakness 06/12/2022    Nephrostomy status (HCC) 06/12/2022   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) 04/16/2022   GI bleed 03/14/2022   Acute on chronic anemia 02/24/2022   Hypotension 02/24/2022   Acute cystitis with hematuria    Lymphedema 02/08/2022   Hematuria 12/24/2021   History of pulmonary embolus (PE) 12/24/2021   Chronic radiation cystitis 11/17/2021   Hydronephrosis of left kidney    Gastric antral vascular ectasia    Complicated urinary tract infection 09/13/2021   AKI (acute kidney injury) (HCC) 09/13/2021   Melena 07/18/2021   Acute on chronic blood loss anemia 07/18/2021   HLD (hyperlipidemia) 06/20/2021   Chronic diastolic CHF (congestive heart failure) (HCC) 06/20/2021   Risk factors for obstructive sleep apnea 11/10/2020   IDA (iron deficiency anemia) 09/21/2020   Endometrial cancer (HCC) 09/13/2020   Recurrent deep vein thrombosis (DVT) (HCC) 09/06/2020   Obesity, Class III, BMI 40-49.9 (morbid obesity) (HCC) 08/23/2020   Dyslipidemia 08/23/2020   PCP:  [facility] Pharmacy:   [id number] [place], [place] - [address] AT [facility] OF [address] [place] [address] Phone: [phone] Fax: [phone]     Social Determinants of Health (SDOH) Social History: SDOH Screenings   Food Insecurity: No Food Insecurity (02/11/2023)  Housing: Low Risk  (02/11/2023)  Transportation Needs: No Transportation Needs (02/11/2023)  Utilities: Not At Risk (02/11/2023)  Depression (PHQ2-9): Low Risk  (04/11/2022)  Tobacco Use: Low Risk  (02/10/2023)   SDOH Interventions:     Readmission Risk Interventions    04/07/2023   10:32 AM 02/12/2023    1:32 PM 11/21/2022   12:18 PM  Readmission Risk Prevention Plan  Transportation Screening Complete Complete Complete  PCP or Specialist Appt within 3-5 Days Complete    Palliative Care Screening Not Applicable    Medication Review (RN Care Manager) Complete  Complete Complete  PCP or Specialist appointment within 3-5 days of discharge  Complete Complete  HRI or Home Care Consult  Complete   SW Recovery Care/Counseling Consult  Complete Complete  Palliative Care Screening  Not Applicable Not Applicable  Skilled Nursing Facility  Not Applicable Not Applicable

## 2023-04-07 NOTE — Progress Notes (Signed)
PROGRESS NOTE    Courtney Mack  RUE:[id number] DOB: 1955-12-09 DOA: 04/05/2023 PCP: [facility]      Brief Narrative:   Courtney Mack is a 67 y.o. female with medical history significant for left-sided hydronephrosis status post nephrostomy tube placement, hypertension, COPD, history of extensive bilateral PE , diastolic CHF, hospitalized from 8/5 to 8/7 with [facility]rose[facility], who presents to the emergency department for chief concerns left flank pain and decreased output from left nephrostomy tube.  She reports that for several days prior to onset of the flank pain and decreased output she was seeing a lot of debris in the drainage bag.    Presently she denies nausea vomiting, fever or chills   The ED provider spoke with urology who recommended irrigating the tube.  After irrigating in the ED there was no flow back into the bag so they recommended hospitalization for IR to change the drain in the a.m. Patient started on Rocephin, given hydrocodone for pain and hospitalist consulted for admission.  Assessment & Plan:   Principal Problem:   Blocked nephrostomy tube with hydronephrosis and hydroureter (HCC) Active Problems:   Hydronephrosis of left kidney   Complicated urinary tract infection   Chronic diastolic CHF (congestive heart failure) (HCC)   Hypokalemia   Nephrostomy status (HCC)   Recurrent deep vein thrombosis (DVT) (HCC)   Obesity, Class III, BMI 40-49.9 (morbid obesity) (HCC)   Endometrial cancer (HCC)   History of pulmonary embolus (PE)   Chronic obstructive pulmonary disease (COPD) (HCC)  # Malfunctioning left nephrostomy tube Poor output at home, didn't flush properly here, IR exchanged it on 9/29  # Cystitis # Flank pain Two days dysuria, urinalysis suggestive of infection, CT without signs obstruction, pyelo, or other infectious complication. Febrile yesterday to 100.9. Today with ongoing left flank pain, no signs skin  infection there or hematoma - continue ceftraixone - f/u urine culture, will order additional urine culture today from nephrostomy bag - will order blood cultures but above w/u began after start of ceftriaxone  # History endometrial cancer Treated, now on surveillance  # COPD Quiescent - home breo  # HFpEF Appears euvolemic - hold home lasix while pursuing infectious w/u and treating infection  # History recurrent GI bleed No report of bleeding, hgb drop today, dilution? - trend  # History DVT/PD S/p DVT filter, not anticoagulated 2/2 recurrent GI bleed  # Chronic hypotension BP stable - home midodrine  DVT prophylaxis: SCDs Code Status: full Family Communication: daughter [name] updated telephonically 9/30  Level of care: Med-Surg Status is: Inpatient Remains inpatient appropriate because: need for inpatient procedure    Consultants:  IR  Procedures: pending  Antimicrobials:  ceftriaxone    Subjective: Reports ongoing flank pain at least moderate in intensity  Objective: Vitals:   04/06/23 0915 04/06/23 2027 04/07/23 0311 04/07/23 0818  BP:  (!) 100/50 (!) 99/47 (!) 100/40  Pulse:  76 75 71  Resp:  18 18 18   Temp: 99 F (37.2 C) 98.4 F (36.9 C) 98.5 F (36.9 C) 98.5 F (36.9 C)  TempSrc:    Oral  SpO2:  94% 94% 93%  Weight:      Height:        Intake/Output Summary (Last 24 hours) at 04/07/2023 1011 Last data filed at 04/07/2023 0147 Gross per 24 hour  Intake 480 ml  Output 900 ml  Net -420 ml   Filed Weights   04/05/23 1500  Weight: 88.5  kg    Examination:  General exam: Appears calm and comfortable  Respiratory system: Clear to auscultation save for rales at bases Cardiovascular system: S1 & S2 heard, RRR. No JVD, murmurs, rubs, gallops or clicks.   Gastrointestinal system: Abdomen is nondistended, soft and nontender. No organomegaly or masses felt. Normal bowel sounds heard. Central nervous system: Alert and oriented. No focal  neurological deficits. Extremities: Symmetric 5 x 5 power. Mild LE edema Skin: No rashes, lesions or ulcers. Nephrostomy tube left flank, normal skin but flank tenderness around it Psychiatry: Judgement and insight appear normal. Mood & affect appropriate.     Data Reviewed: I have personally reviewed following labs and imaging studies  CBC: Recent Labs  Lab 04/05/23 1648 04/06/23 0004 04/07/23 0307  WBC 6.8 6.1 5.4  NEUTROABS 5.8  --   --   HGB 10.7* 10.1* 8.1*  HCT 35.6* 33.7* 25.6*  MCV 96.5 95.7 91.8  PLT 196 231 194   Basic Metabolic Panel: Recent Labs  Lab 04/05/23 1648 04/06/23 0004 04/07/23 0307  NA 138 138 139  K 3.1* 3.7 3.4*  CL 101 100 102  CO2 23 28 27   GLUCOSE 139* 139* 123*  BUN 20 20 25*  CREATININE 0.85 0.86 0.97  CALCIUM 9.0 8.8* 8.1*   GFR: Estimated Creatinine Clearance: 53.7 mL/min (by C-G formula based on SCr of 0.97 mg/dL). Liver Function Tests: No results for input(s): "AST", "ALT", "ALKPHOS", "BILITOT", "PROT", "ALBUMIN" in the last 168 hours. No results for input(s): "LIPASE", "AMYLASE" in the last 168 hours. No results for input(s): "AMMONIA" in the last 168 hours. Coagulation Profile: No results for input(s): "INR", "PROTIME" in the last 168 hours. Cardiac Enzymes: No results for input(s): "CKTOTAL", "CKMB", "CKMBINDEX", "TROPONINI" in the last 168 hours. BNP (last 3 results) No results for input(s): "PROBNP" in the last 8760 hours. HbA1C: No results for input(s): "HGBA1C" in the last 72 hours. CBG: No results for input(s): "GLUCAP" in the last 168 hours. Lipid Profile: No results for input(s): "CHOL", "HDL", "LDLCALC", "TRIG", "CHOLHDL", "LDLDIRECT" in the last 72 hours. Thyroid Function Tests: No results for input(s): "TSH", "T4TOTAL", "FREET4", "T3FREE", "THYROIDAB" in the last 72 hours. Anemia Panel: No results for input(s): "VITAMINB12", "FOLATE", "FERRITIN", "TIBC", "IRON", "RETICCTPCT" in the last 72 hours. Urine  analysis:    Component Value Date/Time   COLORURINE YELLOW (A) 04/05/2023 2155   APPEARANCEUR CLOUDY (A) 04/05/2023 2155   APPEARANCEUR Cloudy (A) 01/03/2022 1443   LABSPEC 1.013 04/05/2023 2155   PHURINE 5.0 04/05/2023 2155   GLUCOSEU NEGATIVE 04/05/2023 2155   HGBUR SMALL (A) 04/05/2023 2155   BILIRUBINUR NEGATIVE 04/05/2023 2155   BILIRUBINUR Negative 01/03/2022 1443   KETONESUR NEGATIVE 04/05/2023 2155   PROTEINUR 30 (A) 04/05/2023 2155   NITRITE NEGATIVE 04/05/2023 2155   LEUKOCYTESUR LARGE (A) 04/05/2023 2155   Sepsis Labs: @LABRCNTIP (procalcitonin:4,lacticidven:4)  ) Recent Results (from the past 240 hour(s))  SARS Coronavirus 2 by RT PCR (hospital order, performed in [facility] hospital lab) *cepheid single result test* Anterior Nasal Swab     Status: None   Collection Time: 04/06/23  4:35 PM   Specimen: Anterior Nasal Swab  Result Value Ref Range Status   SARS Coronavirus 2 by RT PCR NEGATIVE NEGATIVE Final    Comment: (NOTE) SARS-CoV-2 target nucleic acids are NOT DETECTED.  The SARS-CoV-2 RNA is generally detectable in upper and lower respiratory specimens during the acute phase of infection. The lowest concentration of SARS-CoV-2 viral copies this assay can detect is 250  copies / mL. A negative result does not preclude SARS-CoV-2 infection and should not be used as the sole basis for treatment or other patient management decisions.  A negative result may occur with improper specimen collection / handling, submission of specimen other than nasopharyngeal swab, presence of viral mutation(s) within the areas targeted by this assay, and inadequate number of viral copies (<250 copies / mL). A negative result must be combined with clinical observations, patient history, and epidemiological information.  Fact Sheet for Patients:   RoadLapTop.co.za  Fact Sheet for Healthcare Providers: http://kim-miller.com/  This test  is not yet approved or  cleared by the [place] FDA and has been authorized for detection and/or diagnosis of SARS-CoV-2 by FDA under an Emergency Use Authorization (EUA).  This EUA will remain in effect (meaning this test can be used) for the duration of the COVID-19 declaration under Section 564(b)(1) of the Act, 21 U.S.C. section 360bbb-3(b)(1), unless the authorization is terminated or revoked sooner.  Performed at [facility], [address], [place], [place] [address]          Radiology Studies: IR NEPHROSTOMY EXCHANGE LEFT  Result Date: 04/06/2023 INDICATION: 67 year old female with chronic indwelling left percutaneous nephrostomy tube. She has had no drainage from the tube since last Thursday and is developing increasing flank pain. CT imaging demonstrates mild hydronephrosis. She presents for tube exchange. EXAM: Nephrostomy tube exchange COMPARISON:  None Available. MEDICATIONS: None. ANESTHESIA/SEDATION: None. CONTRAST:  5 mL Omnipaque 300 - administered into the collecting system(s) FLUOROSCOPY: Radiation Exposure Index (as provided by the fluoroscopic device): 3 mGy Kerma COMPLICATIONS: None immediate. PROCEDURE: Informed written consent was obtained from the patient after a thorough discussion of the procedural risks, benefits and alternatives. All questions were addressed. Maximal Sterile Barrier Technique was utilized including caps, mask, sterile gowns, sterile gloves, sterile drape, hand hygiene and skin antiseptic. A timeout was performed prior to the initiation of the procedure. Contrast was injected through the existing catheter. The catheter is well positioned in the renal pelvis. The catheter was transected and removed over an Amplatz wire. A new 10 [place] all-purpose drainage catheter was advanced over the wire and formed in the renal pelvis. Fluoroscopic saved images were stored for the medical record. A gentle hand injection of contrast material confirms  catheter placement. There is brisk return of urine and contrast. The tube was connected to a bag. IMPRESSION: Successful exchange for a new 10 French percutaneous nephrostomy tube. Electronically Signed   By: Malachy Moan M.D.   On: 04/06/2023 12:53   CT Renal Stone Study  Result Date: 04/05/2023 CLINICAL DATA:  Nephrostomy catheter displacement. No urine output from left nephrostomy tube for 2 days. Left flank pain. EXAM: CT ABDOMEN AND PELVIS WITHOUT CONTRAST TECHNIQUE: Multidetector CT imaging of the abdomen and pelvis was performed following the standard protocol without IV contrast. RADIATION DOSE REDUCTION: This exam was performed according to the departmental dose-optimization program which includes automated exposure control, adjustment of the mA and/or kV according to patient size and/or use of iterative reconstruction technique. COMPARISON:  Abdominopelvic CT 11/20/2022 and 03/26/2022. FINDINGS: Lower chest: Mild atelectasis at both lung bases. Mild aortic atherosclerosis. No significant pleural or pericardial effusion. Hepatobiliary: The liver is normal in density without suspicious focal abnormality. The liver appears unremarkable as imaged in the noncontrast state. Pancreas: Generalized atrophy. No pancreatic ductal dilatation, focal lesion or surrounding inflammation. Spleen: Normal in size without focal abnormality. Adrenals/Urinary Tract: Both adrenal glands appear normal. Left percutaneous nephrostomy tube  appears unchanged within the left renal pelvis. There is no perinephric fluid collection. However, the patient has developed mild to moderate left-sided hydronephrosis and hydroureter. No evidence of ureteral calculus. The right kidney appears unchanged, without hydronephrosis. There is a 3 mm nonobstructing calculus in the lower pole of the right kidney which is unchanged. The bladder is decompressed. As before, possible mild bladder wall thickening and perivesical soft tissue stranding,  suboptimally evaluated due to incomplete distension. Stomach/Bowel: No enteric contrast administered. The stomach appears unremarkable for its degree of distension. No evidence of bowel wall thickening, distention or surrounding inflammatory change. The appendix appears normal. Vascular/Lymphatic: Mildly prominent retroperitoneal lymph nodes are unchanged, likely reactive to the indwelling nephrostomy tube. No progressive retroperitoneal lymphadenopathy. Mild aortic and branch vessel atherosclerosis without evidence of aneurysm. Infrarenal IVC filter appears unchanged in position. Reproductive: The uterus and ovaries appear unremarkable. No adnexal mass. Other: No evidence of abdominal wall mass or hernia. No ascites or pneumoperitoneum. Musculoskeletal: No acute osseous findings. Severe degenerative changes at both hips are again noted. Mild generalized subcutaneous edema. Unless specific follow-up recommendations are mentioned in the findings or impression sections, no imaging follow-up of any mentioned incidental findings is recommended. IMPRESSION: 1. Interval development of mild-to-moderate left-sided hydronephrosis and hydroureter without evidence of ureteral calculus or perinephric fluid collection. The indwelling left percutaneous nephrostomy tube appears unchanged in position but may be dysfunctional based on reported limited output. Correlate clinically. 2. Unchanged nonobstructing right renal calculus. 3. Unchanged mild bladder wall thickening and perivesical soft tissue stranding, suboptimally evaluated due to incomplete bladder distension. 4. Stable mildly prominent retroperitoneal lymph nodes, likely reactive to the indwelling nephrostomy tube. 5.  Aortic Atherosclerosis (ICD10-I70.0). Electronically Signed   By: Carey Bullocks M.D.   On: 04/05/2023 16:44        Scheduled Meds:  fluticasone furoate-vilanterol  1 puff Inhalation Daily   furosemide  20 mg Oral Daily   midodrine  5 mg Oral TID  WC   pantoprazole  40 mg Oral BID   rosuvastatin  10 mg Oral Daily   Continuous Infusions:  sodium chloride 10 mL/hr at 04/06/23 2239   cefTRIAXone (ROCEPHIN)  IV 1 g (04/06/23 2243)     LOS: 2 days     Silvano Bilis, MD [facility]   If 7PM-7AM, please contact night-coverage [email or web address] Password [email or web address] 04/07/2023, 10:11 AM

## 2023-04-08 DIAGNOSIS — E876 Hypokalemia: Secondary | ICD-10-CM

## 2023-04-08 DIAGNOSIS — N99528 Other complication of other external stoma of urinary tract: Secondary | ICD-10-CM | POA: Diagnosis not present

## 2023-04-08 LAB — BASIC METABOLIC PANEL
Anion gap: 5 (ref 5–15)
BUN: 23 mg/dL (ref 8–23)
CO2: 29 mmol/L (ref 22–32)
Calcium: 8.4 mg/dL — ABNORMAL LOW (ref 8.9–10.3)
Chloride: 101 mmol/L (ref 98–111)
Creatinine, Ser: 0.93 mg/dL (ref 0.44–1.00)
GFR, Estimated: >60 mL/min (ref >60)
Glucose, Bld: 125 mg/dL — ABNORMAL HIGH (ref 70–99)
Potassium: 3.3 mmol/L — ABNORMAL LOW (ref 3.5–5.1)
Sodium: 135 mmol/L (ref 135–145)

## 2023-04-08 LAB — URINE CULTURE
Culture: >=100000 — AB
Culture: NO GROWTH

## 2023-04-08 LAB — CBC
HCT: 29 % — ABNORMAL LOW (ref 36.0–46.0)
Hemoglobin: 8.8 g/dL — ABNORMAL LOW (ref 12.0–15.0)
MCH: 28.8 pg (ref 26.0–34.0)
MCHC: 30.3 g/dL (ref 30.0–36.0)
MCV: 94.8 fL (ref 80.0–100.0)
Platelets: 208 10*3/uL (ref 150–400)
RBC: 3.06 MIL/uL — ABNORMAL LOW (ref 3.87–5.11)
RDW: 15.4 % (ref 11.5–15.5)
WBC: 3.8 10*3/uL — ABNORMAL LOW (ref 4.0–10.5)
nRBC: 0 % (ref 0.0–0.2)

## 2023-04-08 LAB — TROPONIN I (HIGH SENSITIVITY)
Troponin I (High Sensitivity): 7 ng/L (ref <18)
Troponin I (High Sensitivity): 7 ng/L (ref <18)

## 2023-04-08 MED ORDER — SIMETHICONE 80 MG PO CHEW
80.0000 mg | CHEWABLE_TABLET | Freq: Four times a day (QID) | ORAL | Status: DC | PRN
  Administered 2023-04-08: 80 mg via ORAL
  Filled 2023-04-08: qty 1

## 2023-04-08 MED ORDER — CEFPODOXIME PROXETIL 200 MG PO TABS: 200.0000 mg | ORAL_TABLET | Freq: Two times a day (BID) | ORAL | 0 refills | Status: AC

## 2023-04-08 MED ORDER — SIMETHICONE 80 MG PO CHEW: 80.0000 mg | CHEWABLE_TABLET | Freq: Four times a day (QID) | ORAL | 0 refills | Status: AC | PRN

## 2023-04-08 MED ORDER — ALUM & MAG HYDROXIDE-SIMETH 200-200-20 MG/5ML PO SUSP
30.0000 mL | ORAL | Status: DC | PRN
  Administered 2023-04-08: 30 mL via ORAL
  Filled 2023-04-08: qty 30

## 2023-04-08 NOTE — Care Management Important Message (Signed)
Important Message  Patient Details  Name: Courtney Mack MRN: [id number] Date of Birth: 1956/04/23   Important Message Given:  Yes - Medicare IM     Johnell Comings 04/08/2023, 11:36 AM

## 2023-04-08 NOTE — Discharge Summary (Signed)
Courtney Mack ZOX:[id number] DOB: Aug 04, 1955 DOA: 04/05/2023  PCP: [facility]  Admit date: 04/05/2023 Discharge date: 04/08/2023  Time spent: 35 minutes  Recommendations for Outpatient Follow-up:  Urology f/u this week as scheduled     Discharge Diagnoses:  Principal Problem:   Blocked nephrostomy tube with hydronephrosis and hydroureter (HCC) Active Problems:   Hydronephrosis of left kidney   Complicated urinary tract infection   Chronic diastolic CHF (congestive heart failure) (HCC)   Hypokalemia   Nephrostomy status (HCC)   Recurrent deep vein thrombosis (DVT) (HCC)   Obesity, Class III, BMI 40-49.9 (morbid obesity) (HCC)   Endometrial cancer (HCC)   History of pulmonary embolus (PE)   Chronic obstructive pulmonary disease (COPD) (HCC)   Discharge Condition: stable  Diet recommendation: heart healthy  Filed Weights   04/05/23 1500  Weight: 88.5 kg    History of present illness:  From admission h and p ACE HOCUTT is a 67 y.o. female with medical history significant for left-sided hydronephrosis status post nephrostomy tube placement, hypertension, COPD, history of extensive bilateral PE , diastolic CHF, hospitalized from 8/5 to 8/7 with [facility] urosepsis, who presents to the emergency department for chief concerns left flank pain and decreased output from left nephrostomy tube.  She reports that for several days prior to onset of the flank pain and decreased output she was seeing a lot of debris in the drainage bag.    Presently she denies nausea vomiting, fever or chills   Hospital Course:  Patient presented with malfunctioning left nephrostomy tube. Unable to resolve this with flushing in the ER so patient was admitted for IR exchange which was performed hospital day 1. Patient spiked a fever that day and experienced ongoing flank pain so she was treated for presumed complicated UTI with ceftriaxone. Patient experienced persistent  flank pain on hospital day 2 so CT of the abdomen and pelvis was performed which showed no infectious or other acute complications. Pain is improving on day of discharge and patient is afebrile with stable labs, will discharge home with oral cephalosporin to complete a total of 2 weeks antibiotics. She says she has urology f/u later this week and that appointment can serve as follow-up to this problem.   Procedures: IR nephrostomy tube exchange   Consultations: IR  Discharge Exam: Vitals:   04/08/23 0805 04/08/23 1143  BP: (!) 104/36 (!) 109/34  Pulse: 63 72  Resp: 16 18  Temp: 98.2 F (36.8 C) 98.2 F (36.8 C)  SpO2: 98% 97%    General: NAD Cardiovascular: RRR Respiratory: CTAB   Discharge Instructions   Discharge Instructions     Diet - low sodium heart healthy   Complete by: As directed    Increase activity slowly   Complete by: As directed    No wound care   Complete by: As directed       Allergies as of 04/08/2023   No Known Allergies      Medication List     STOP taking these medications    furosemide 20 MG tablet Commonly known as: LASIX       TAKE these medications    acetaminophen 500 MG tablet Commonly known as: TYLENOL Take 500 mg by mouth every 6 (six) hours as needed for mild pain or fever.   albuterol 108 (90 Base) MCG/ACT inhaler Commonly known as: VENTOLIN HFA Inhale 2 puffs into the lungs every 6 (six) hours as needed for wheezing or shortness of breath.  cefpodoxime 200 MG tablet Commonly known as: VANTIN Take 1 tablet (200 mg total) by mouth 2 (two) times daily for 12 days.   FeroSul 325 (65 Fe) MG tablet Generic drug: ferrous sulfate Take 325 mg by mouth daily with breakfast.   fluticasone furoate-vilanterol 100-25 MCG/ACT Aepb Commonly known as: BREO ELLIPTA Inhale 1 puff into the lungs daily.   midodrine 5 MG tablet Commonly known as: PROAMATINE Take 1 tablet (5 mg total) by mouth 3 (three) times daily with meals.    multivitamin with minerals Tabs tablet Take 1 tablet by mouth daily.   pantoprazole 40 MG tablet Commonly known as: PROTONIX Take 1 tablet (40 mg total) by mouth 2 (two) times daily.   rosuvastatin 10 MG tablet Commonly known as: CRESTOR Take 1 tablet (10 mg total) by mouth daily.   triamcinolone cream 0.1 % Commonly known as: KENALOG Apply 1 Application topically 2 (two) times daily as needed.   vitamin C 1000 MG tablet Take 1,000 mg by mouth daily.       No Known Allergies  Follow-up Information     Your urologist Follow up.   Why: this week as scheduled                 The results of significant diagnostics from this hospitalization (including imaging, microbiology, ancillary and laboratory) are listed below for reference.    Significant Diagnostic Studies: CT ABDOMEN PELVIS W CONTRAST  Result Date: 04/07/2023 CLINICAL DATA:  Flank pain. EXAM: CT ABDOMEN AND PELVIS WITH CONTRAST TECHNIQUE: Multidetector CT imaging of the abdomen and pelvis was performed using the standard protocol following bolus administration of intravenous contrast. RADIATION DOSE REDUCTION: This exam was performed according to the departmental dose-optimization program which includes automated exposure control, adjustment of the mA and/or kV according to patient size and/or use of iterative reconstruction technique. CONTRAST:  OMNIPAQUE IOHEXOL 300 MG/ML  SOLN COMPARISON:  April 05, 2023. FINDINGS: Lower chest: Minimal bibasilar subsegmental atelectasis. Hepatobiliary: No focal liver abnormality is seen. No gallstones, gallbladder wall thickening, or biliary dilatation. Pancreas: Unremarkable. No pancreatic ductal dilatation or surrounding inflammatory changes. Spleen: Normal in size without focal abnormality. Adrenals/Urinary Tract: Adrenal glands appear normal. Small nonobstructive right renal calculus is noted. Left percutaneous nephrostomy is noted. No significant hydronephrosis or  renal obstruction is noted. Urinary bladder is decompressed. Stomach/Bowel: Stomach is within normal limits. Appendix appears normal. No evidence of bowel wall thickening, distention, or inflammatory changes. Vascular/Lymphatic: Aortic atherosclerosis. No enlarged abdominal or pelvic lymph nodes. IVC filter is noted. Reproductive: Uterus and bilateral adnexa are unremarkable. Other: No abdominal wall hernia or abnormality. No abdominopelvic ascites. Musculoskeletal: No acute or significant osseous findings. IMPRESSION: Left percutaneous nephrostomy is noted. No significant hydronephrosis or renal obstruction is noted. Small nonobstructive right renal calculus. Aortic Atherosclerosis (ICD10-I70.0). Electronically Signed   By: Lupita Raider M.D.   On: 04/07/2023 15:19   IR NEPHROSTOMY EXCHANGE LEFT  Result Date: 04/06/2023 INDICATION: 67 year old female with chronic indwelling left percutaneous nephrostomy tube. She has had no drainage from the tube since last Thursday and is developing increasing flank pain. CT imaging demonstrates mild hydronephrosis. She presents for tube exchange. EXAM: Nephrostomy tube exchange COMPARISON:  None Available. MEDICATIONS: None. ANESTHESIA/SEDATION: None. CONTRAST:  5 mL Omnipaque 300 - administered into the collecting system(s) FLUOROSCOPY: Radiation Exposure Index (as provided by the fluoroscopic device): 3 mGy Kerma COMPLICATIONS: None immediate. PROCEDURE: Informed written consent was obtained from the patient after a thorough discussion of the procedural risks,  benefits and alternatives. All questions were addressed. Maximal Sterile Barrier Technique was utilized including caps, mask, sterile gowns, sterile gloves, sterile drape, hand hygiene and skin antiseptic. A timeout was performed prior to the initiation of the procedure. Contrast was injected through the existing catheter. The catheter is well positioned in the renal pelvis. The catheter was transected and removed  over an Amplatz wire. A new 10 [place] all-purpose drainage catheter was advanced over the wire and formed in the renal pelvis. Fluoroscopic saved images were stored for the medical record. A gentle hand injection of contrast material confirms catheter placement. There is brisk return of urine and contrast. The tube was connected to a bag. IMPRESSION: Successful exchange for a new 10 French percutaneous nephrostomy tube. Electronically Signed   By: Malachy Moan M.D.   On: 04/06/2023 12:53   CT Renal Stone Study  Result Date: 04/05/2023 CLINICAL DATA:  Nephrostomy catheter displacement. No urine output from left nephrostomy tube for 2 days. Left flank pain. EXAM: CT ABDOMEN AND PELVIS WITHOUT CONTRAST TECHNIQUE: Multidetector CT imaging of the abdomen and pelvis was performed following the standard protocol without IV contrast. RADIATION DOSE REDUCTION: This exam was performed according to the departmental dose-optimization program which includes automated exposure control, adjustment of the mA and/or kV according to patient size and/or use of iterative reconstruction technique. COMPARISON:  Abdominopelvic CT 11/20/2022 and 03/26/2022. FINDINGS: Lower chest: Mild atelectasis at both lung bases. Mild aortic atherosclerosis. No significant pleural or pericardial effusion. Hepatobiliary: The liver is normal in density without suspicious focal abnormality. The liver appears unremarkable as imaged in the noncontrast state. Pancreas: Generalized atrophy. No pancreatic ductal dilatation, focal lesion or surrounding inflammation. Spleen: Normal in size without focal abnormality. Adrenals/Urinary Tract: Both adrenal glands appear normal. Left percutaneous nephrostomy tube appears unchanged within the left renal pelvis. There is no perinephric fluid collection. However, the patient has developed mild to moderate left-sided hydronephrosis and hydroureter. No evidence of ureteral calculus. The right kidney appears  unchanged, without hydronephrosis. There is a 3 mm nonobstructing calculus in the lower pole of the right kidney which is unchanged. The bladder is decompressed. As before, possible mild bladder wall thickening and perivesical soft tissue stranding, suboptimally evaluated due to incomplete distension. Stomach/Bowel: No enteric contrast administered. The stomach appears unremarkable for its degree of distension. No evidence of bowel wall thickening, distention or surrounding inflammatory change. The appendix appears normal. Vascular/Lymphatic: Mildly prominent retroperitoneal lymph nodes are unchanged, likely reactive to the indwelling nephrostomy tube. No progressive retroperitoneal lymphadenopathy. Mild aortic and branch vessel atherosclerosis without evidence of aneurysm. Infrarenal IVC filter appears unchanged in position. Reproductive: The uterus and ovaries appear unremarkable. No adnexal mass. Other: No evidence of abdominal wall mass or hernia. No ascites or pneumoperitoneum. Musculoskeletal: No acute osseous findings. Severe degenerative changes at both hips are again noted. Mild generalized subcutaneous edema. Unless specific follow-up recommendations are mentioned in the findings or impression sections, no imaging follow-up of any mentioned incidental findings is recommended. IMPRESSION: 1. Interval development of mild-to-moderate left-sided hydronephrosis and hydroureter without evidence of ureteral calculus or perinephric fluid collection. The indwelling left percutaneous nephrostomy tube appears unchanged in position but may be dysfunctional based on reported limited output. Correlate clinically. 2. Unchanged nonobstructing right renal calculus. 3. Unchanged mild bladder wall thickening and perivesical soft tissue stranding, suboptimally evaluated due to incomplete bladder distension. 4. Stable mildly prominent retroperitoneal lymph nodes, likely reactive to the indwelling nephrostomy tube. 5.  Aortic  Atherosclerosis (ICD10-I70.0). Electronically Signed  By: Carey Bullocks M.D.   On: 04/05/2023 16:44    Microbiology: Recent Results (from the past 240 hour(s))  Urine Culture (for pregnant, neutropenic or urologic patients or patients with an indwelling urinary catheter)     Status: Abnormal   Collection Time: 04/05/23  9:53 PM   Specimen: Urine, Clean Catch  Result Value Ref Range Status   Specimen Description   Final    URINE, CLEAN CATCH Performed at [facility], [address] [place], [place] [address]    Special Requests   Final    NONE Performed at [facility], [address] [place], [place] [address]    Culture >=100,000 COLONIES/mL ESCHERICHIA COLI (A)  Final   Report Status 04/08/2023 FINAL  Final   Organism ID, Bacteria ESCHERICHIA COLI (A)  Final      Susceptibility   Escherichia coli - MIC*    AMPICILLIN <=2 SENSITIVE Sensitive     CEFAZOLIN <=4 SENSITIVE Sensitive     CEFEPIME <=0.12 SENSITIVE Sensitive     CEFTRIAXONE <=0.25 SENSITIVE Sensitive     CIPROFLOXACIN <=0.25 SENSITIVE Sensitive     GENTAMICIN <=1 SENSITIVE Sensitive     IMIPENEM <=0.25 SENSITIVE Sensitive     NITROFURANTOIN <=16 SENSITIVE Sensitive     TRIMETH/SULFA <=20 SENSITIVE Sensitive     AMPICILLIN/SULBACTAM <=2 SENSITIVE Sensitive     PIP/TAZO <=4 SENSITIVE Sensitive     * >=100,000 COLONIES/mL ESCHERICHIA COLI  SARS Coronavirus 2 by RT PCR (hospital order, performed in [facility] hospital lab) *cepheid single result test* Anterior Nasal Swab     Status: None   Collection Time: 04/06/23  4:35 PM   Specimen: Anterior Nasal Swab  Result Value Ref Range Status   SARS Coronavirus 2 by RT PCR NEGATIVE NEGATIVE Final    Comment: (NOTE) SARS-CoV-2 target nucleic acids are NOT DETECTED.  The SARS-CoV-2 RNA is generally detectable in upper and lower respiratory specimens during the acute phase of infection. The lowest concentration of SARS-CoV-2 viral copies this assay  can detect is 250 copies / mL. A negative result does not preclude SARS-CoV-2 infection and should not be used as the sole basis for treatment or other patient management decisions.  A negative result may occur with improper specimen collection / handling, submission of specimen other than nasopharyngeal swab, presence of viral mutation(s) within the areas targeted by this assay, and inadequate number of viral copies (<250 copies / mL). A negative result must be combined with clinical observations, patient history, and epidemiological information.  Fact Sheet for Patients:   RoadLapTop.co.za  Fact Sheet for Healthcare Providers: http://kim-miller.com/  This test is not yet approved or  cleared by the [place] FDA and has been authorized for detection and/or diagnosis of SARS-CoV-2 by FDA under an Emergency Use Authorization (EUA).  This EUA will remain in effect (meaning this test can be used) for the duration of the COVID-19 declaration under Section 564(b)(1) of the Act, 21 U.S.C. section 360bbb-3(b)(1), unless the authorization is terminated or revoked sooner.  Performed at [facility], [address] [place], [place] [address]   Culture, blood (Routine X 2) w Reflex to ID Panel     Status: None (Preliminary result)   Collection Time: 04/07/23 10:48 AM   Specimen: BLOOD  Result Value Ref Range Status   Specimen Description BLOOD BLOOD RIGHT HAND  Final   Special Requests   Final    BOTTLES DRAWN AEROBIC AND ANAEROBIC Blood Culture adequate volume  Culture   Final    NO GROWTH < 24 HOURS Performed at [facility], [address] [place], [place] [address]    Report Status PENDING  Incomplete  Culture, blood (Routine X 2) w Reflex to ID Panel     Status: None (Preliminary result)   Collection Time: 04/07/23 10:54 AM   Specimen: BLOOD  Result Value Ref Range Status   Specimen Description BLOOD BLOOD  LEFT HAND  Final   Special Requests   Final    BOTTLES DRAWN AEROBIC AND ANAEROBIC Blood Culture adequate volume   Culture   Final    NO GROWTH < 24 HOURS Performed at [facility], [address], [place], [place] [address]    Report Status PENDING  Incomplete  Urine Culture (for pregnant, neutropenic or urologic patients or patients with an indwelling urinary catheter)     Status: None   Collection Time: 04/07/23 12:02 PM   Specimen: Urine, Catheterized  Result Value Ref Range Status   Specimen Description   Final    URINE, CATHETERIZED Performed at [facility], [address] [place], [place] [address]    Special Requests   Final    NONE Performed at [facility], [address], [place], [place] [address]    Culture   Final    NO GROWTH Performed at [facility], [address], [place], [place] [address]    Report Status 04/08/2023 FINAL  Final     Labs: Basic Metabolic Panel: Recent Labs  Lab 04/05/23 1648 04/06/23 0004 04/07/23 0307 04/08/23 0352  NA 138 138 139 135  K 3.1* 3.7 3.4* 3.3*  CL 101 100 102 101  CO2 23 28 27 29   GLUCOSE 139* 139* 123* 125*  BUN 20 20 25* 23  CREATININE 0.85 0.86 0.97 0.93  CALCIUM 9.0 8.8* 8.1* 8.4*   Liver Function Tests: No results for input(s): "AST", "ALT", "ALKPHOS", "BILITOT", "PROT", "ALBUMIN" in the last 168 hours. No results for input(s): "LIPASE", "AMYLASE" in the last 168 hours. No results for input(s): "AMMONIA" in the last 168 hours. CBC: Recent Labs  Lab 04/05/23 1648 04/06/23 0004 04/07/23 0307 04/08/23 0352  WBC 6.8 6.1 5.4 3.8*  NEUTROABS 5.8  --   --   --   HGB 10.7* 10.1* 8.1* 8.8*  HCT 35.6* 33.7* 25.6* 29.0*  MCV 96.5 95.7 91.8 94.8  PLT 196 231 194 208   Cardiac Enzymes: No results for input(s): "CKTOTAL", "CKMB", "CKMBINDEX", "TROPONINI" in the last 168 hours. BNP: BNP (last 3 results) Recent Labs    04/16/22 2325 10/24/22 1010 11/20/22 2141  BNP  87.4 54.1 41.4    ProBNP (last 3 results) No results for input(s): "PROBNP" in the last 8760 hours.  CBG: No results for input(s): "GLUCAP" in the last 168 hours.     Signed:  Silvano Bilis MD.  [facility] 04/08/2023, 12:47 PM

## 2023-04-09 IMAGING — DX DG CHEST 1V PORT
1 series · 1 of 1 positions shown · non-contrast
Comparison: 10/12/2021

CLINICAL DATA: Possible sepsis

EXAM:
PORTABLE CHEST 1 VIEW

[chest ap]
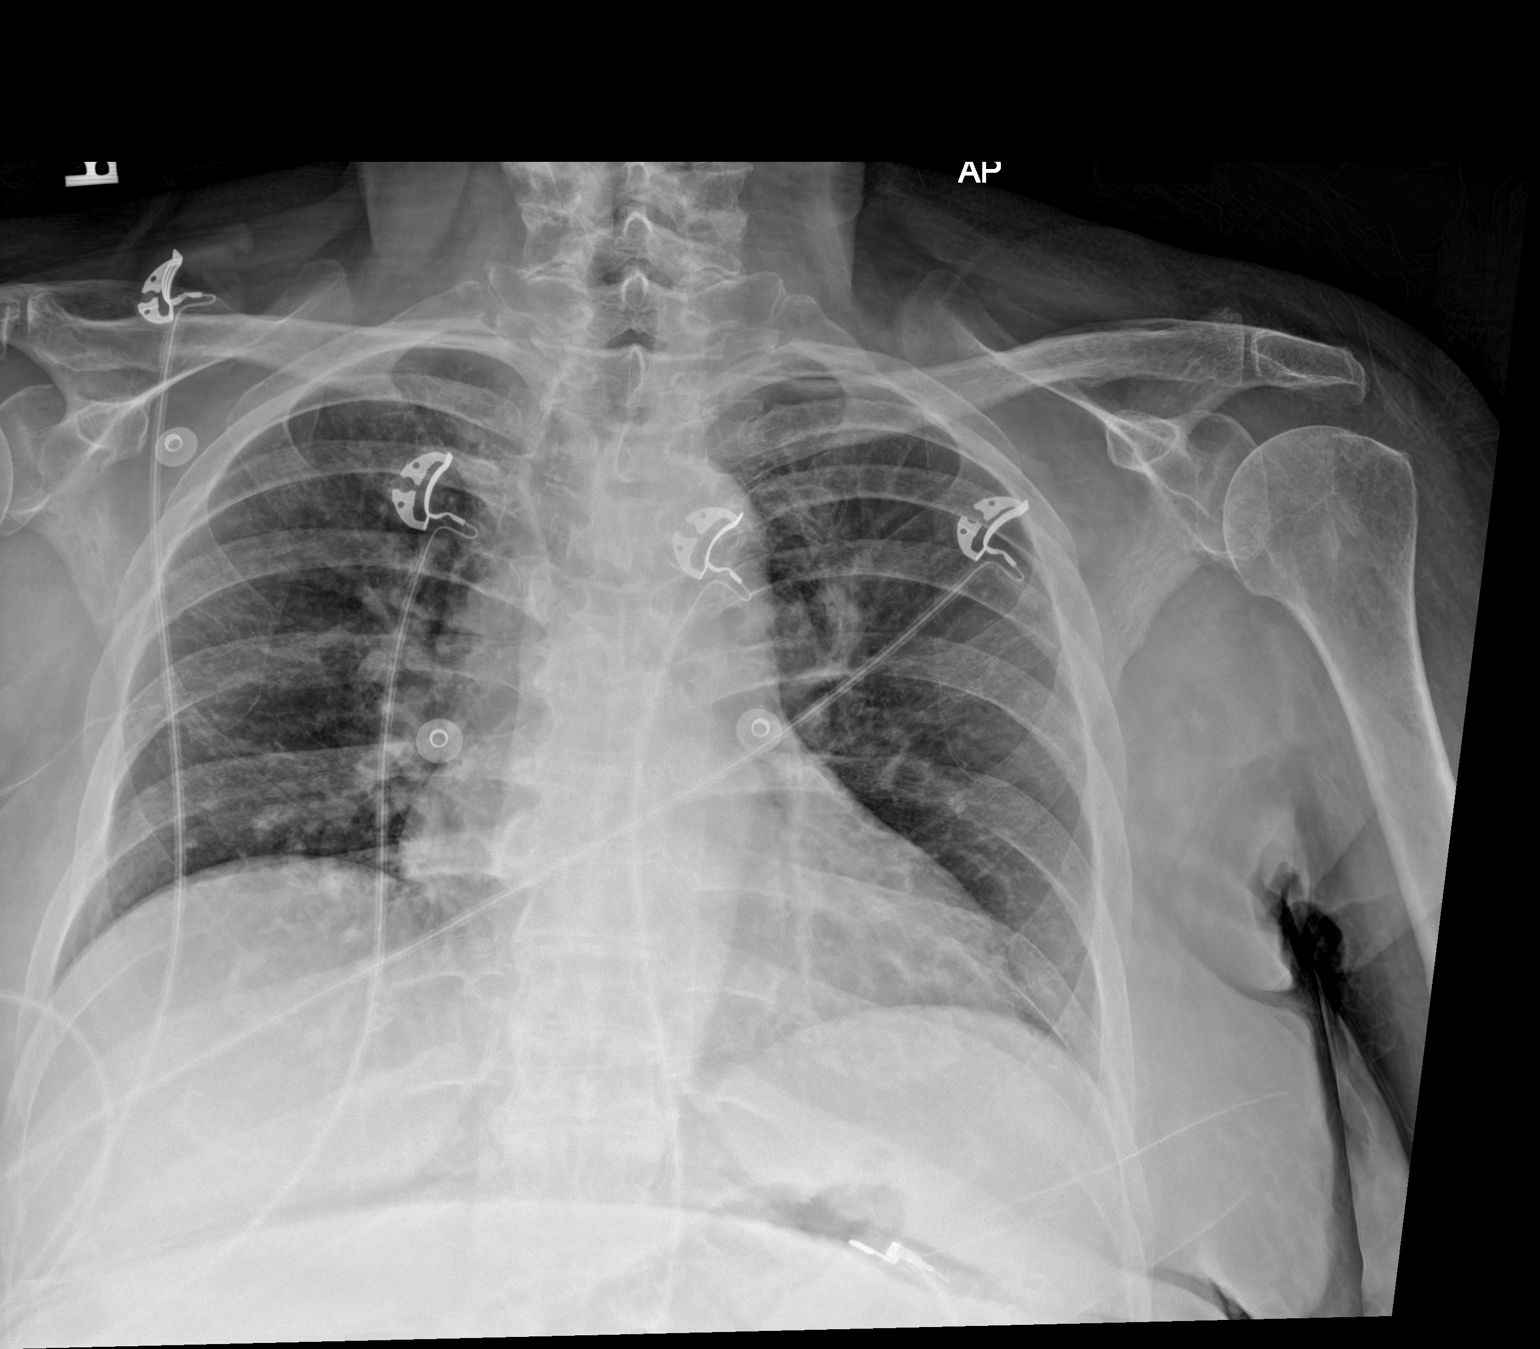

[1 of 1 positions shown; findings below may reference images not displayed]

FINDINGS: The heart size and mediastinal contours are within normal limits.
Both lungs are clear. The visualized skeletal structures are
unremarkable. Hypoventilatory changes. Subsegmental atelectasis at
the bases.
IMPRESSION: No active disease.  Low lung volumes

## 2023-04-09 IMAGING — CT CT RENAL STONE PROTOCOL
2 of 4 series · 16 of 46 positions shown, 18 images · non-contrast
Comparison: CT examination dated September 11, 2021

CLINICAL DATA: Abdominal pain. Removal of the renal stent today as
outpatient surgery. Fever and weakness after surgery.



[Series 2: stone full standard · axial · 0.91mm/px · z∈[-631,-201]mm · 13 of 94 slices shown, 15 images]
[im 4/94  soft-tissue]
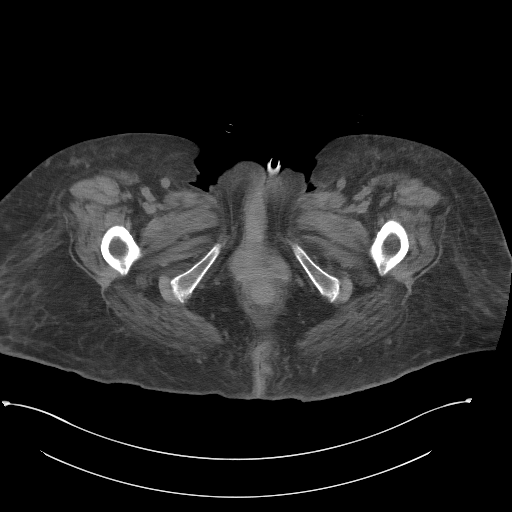
[im 4/94  bone]
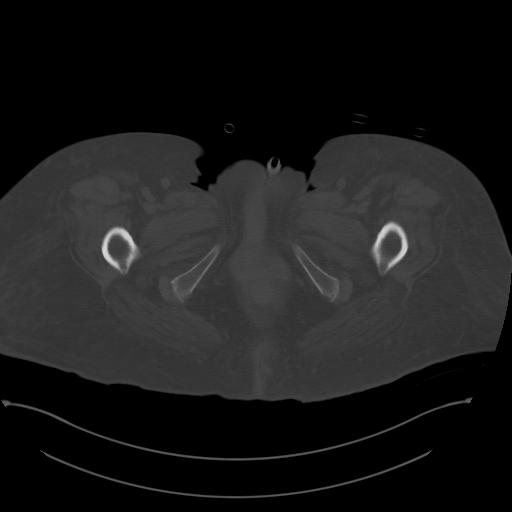
[im 12/94  soft-tissue]
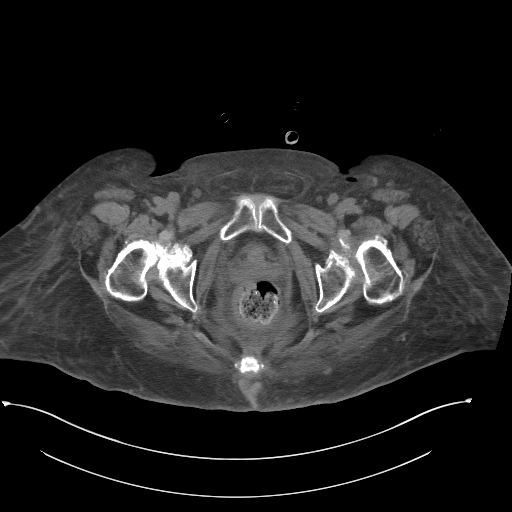
[im 20/94  soft-tissue]
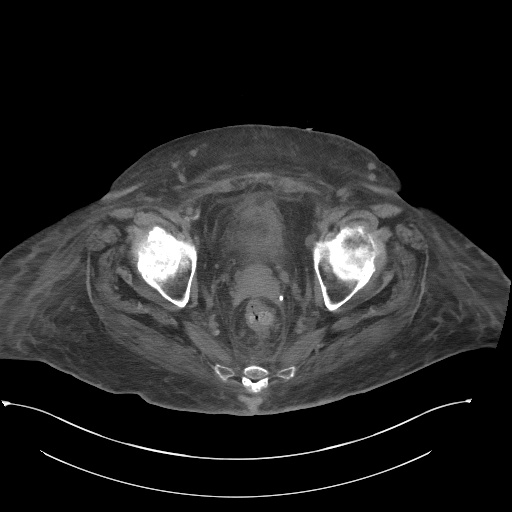
[im 28/94  soft-tissue]
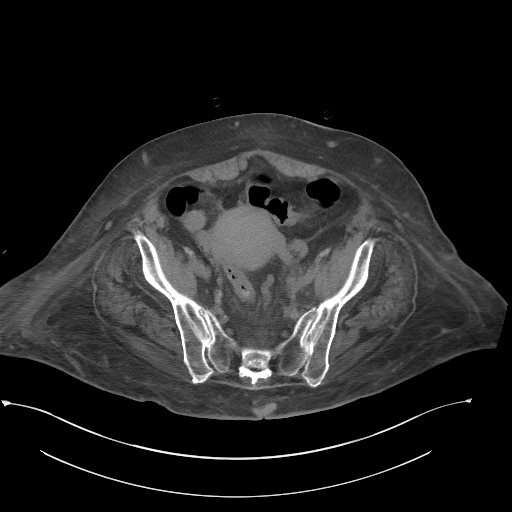
[im 32/94  soft-tissue]
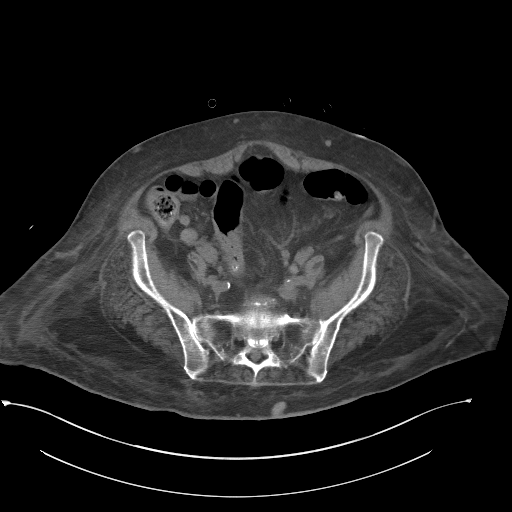
[im 39/94  soft-tissue]
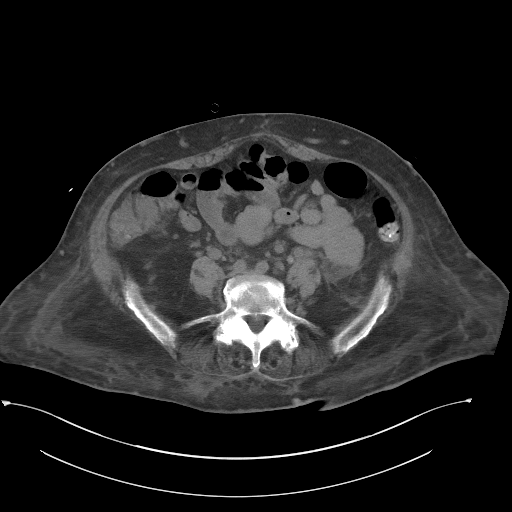
[im 47/94  soft-tissue]
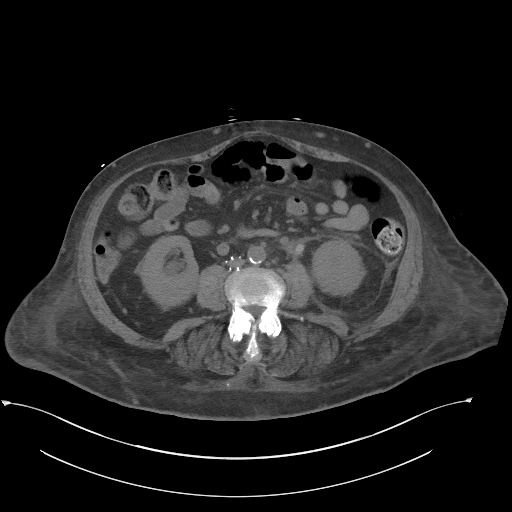
[im 55/94  soft-tissue]
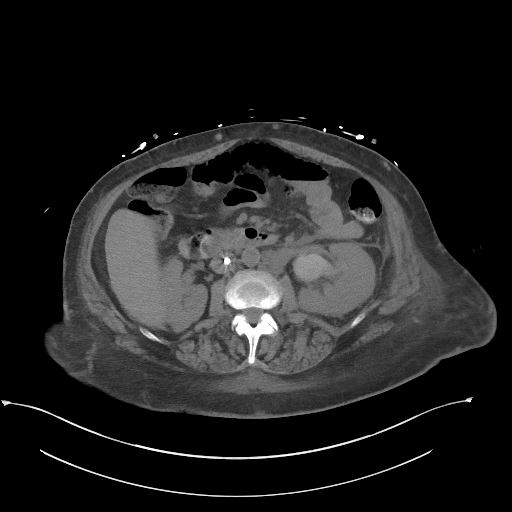
[im 63/94  soft-tissue]
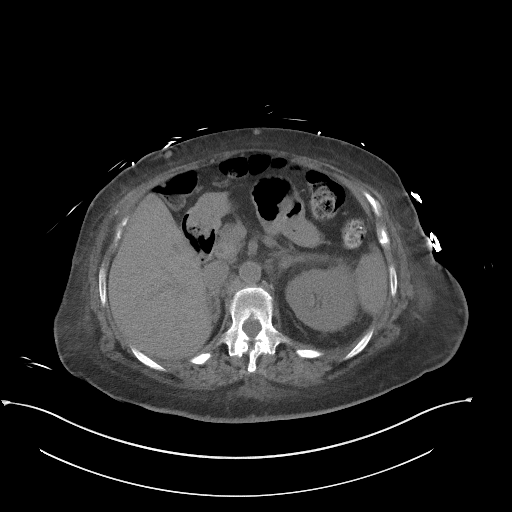
[im 63/94  bone]
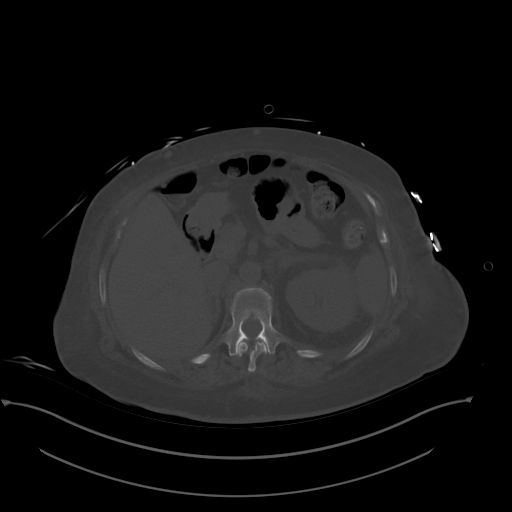
[im 66/94  soft-tissue]
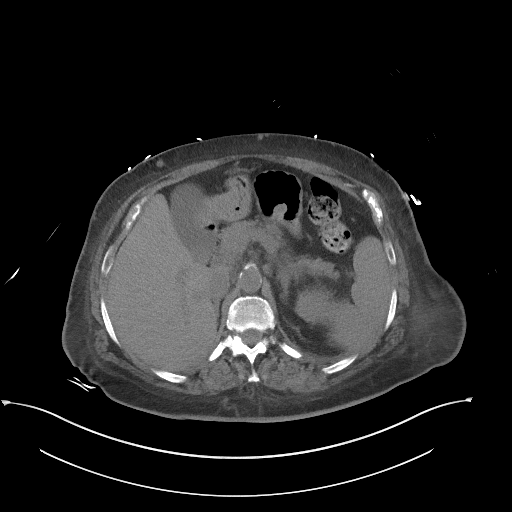
[im 74/94  soft-tissue]
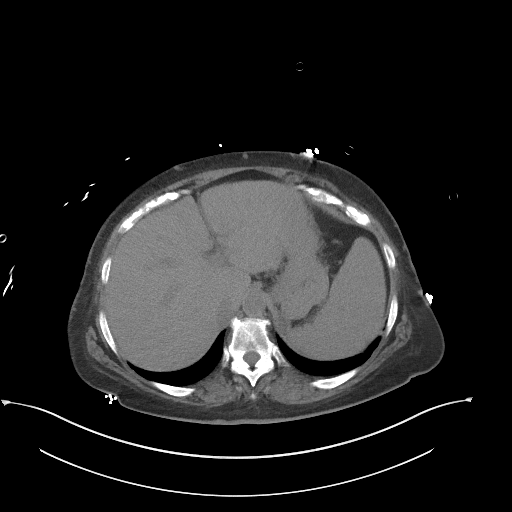
[im 82/94  soft-tissue]
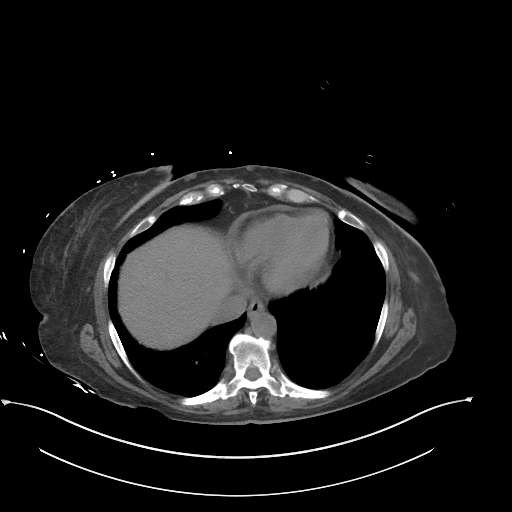
[im 90/94  soft-tissue]
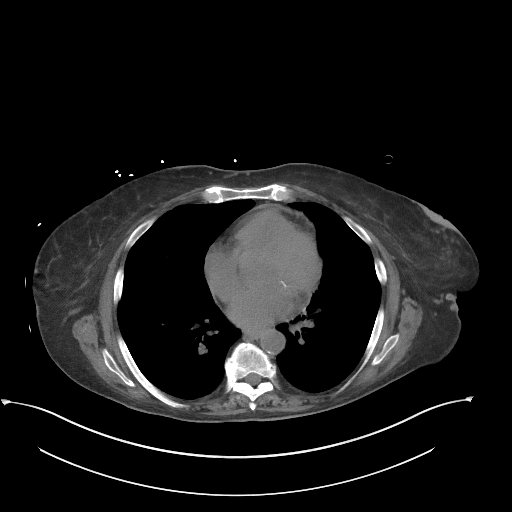

[Series 5: coronal · coronal · 0.91mm/px · 3 of 136 slices shown]
[im 46/136  soft-tissue]
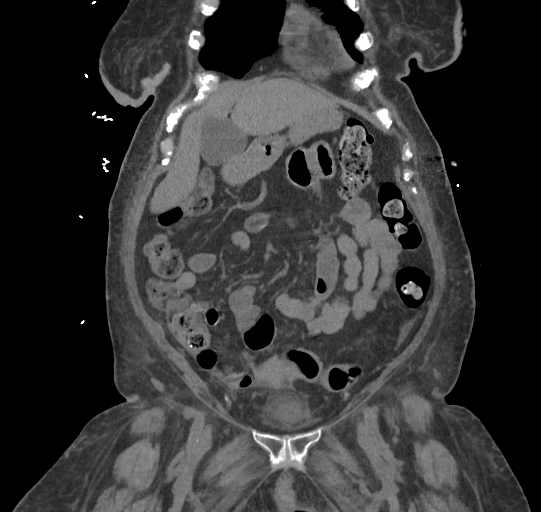
[im 61/136  soft-tissue]
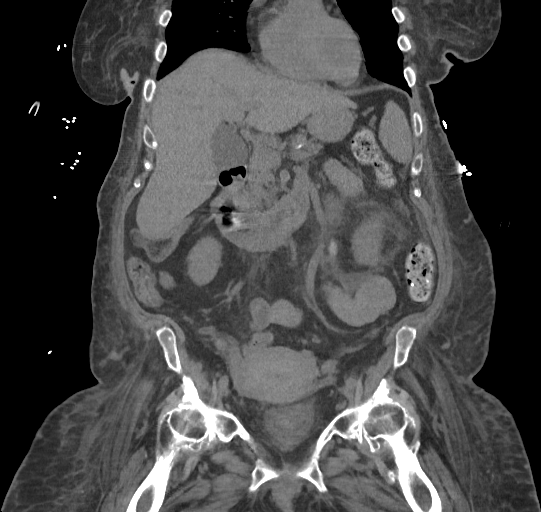
[im 76/136  soft-tissue]
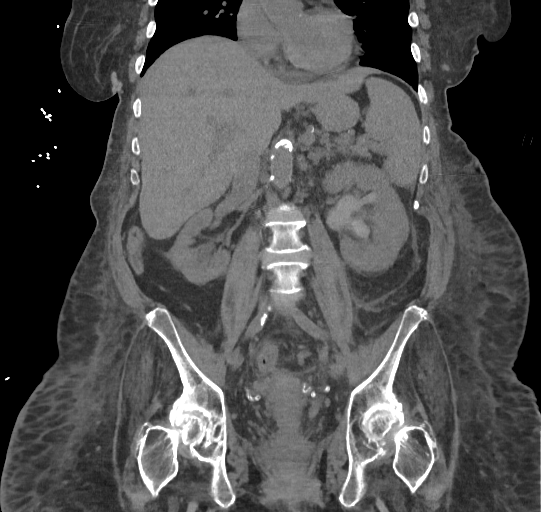

[16 of 46 positions shown; findings below may reference images not displayed]

FINDINGS: Lower chest: No acute abnormality.

Hepatobiliary: No focal liver abnormality is seen. No gallstones,
gallbladder wall thickening, or biliary dilatation.

Pancreas: Unremarkable. No pancreatic ductal dilatation or
surrounding inflammatory changes.

Spleen: Normal in size without focal abnormality.

Adrenals/Urinary Tract: Adrenal glands are unremarkable. 3 mm
nonobstructing calculus in the interpolar region of the right
kidney. No right hydronephrosis or ureteral calculus. Moderate left
hydronephrosis and hydroureter with excreted contrast in the ureter.
No left ureteral calculus mild left perinephric fat stranding,
similar to prior examination. Urinary bladder is underdistended with
mild thickening of the walls, which may be secondary to urinary
tract infection or under distention.

Stomach/Bowel: Stomach is within normal limits. Appendix appears
normal. No evidence of bowel wall thickening, distention, or
inflammatory changes.

Vascular/Lymphatic: Aortic atherosclerosis. No enlarged abdominal or
pelvic lymph nodes. IVC filter in place.

Reproductive: Uterus and bilateral adnexa are unremarkable.

Other: No abdominal wall hernia or abnormality. No abdominopelvic
ascites. Mild generalized anasarca.

Musculoskeletal: Degenerative disease of the lumbar spine. Grade 1
anterolisthesis of L5 and S1. Severe bilateral hip osteoarthritis.
IMPRESSION: 1. 3 mm nonobstructing calculus in the interpolar region of the
right kidney. No right hydronephrosis or ureteral calculus.

2. Persistent left hydronephrosis and hydroureter without evidence
of calculus. There is excreted contrast in the left collecting
system concerning for delayed excretion in the left kidney. Mild
left perinephric fat stranding, similar to prior examination.

3. Urinary bladder is underdistended with thickening of the walls,
which may be secondary to under distention or urinary tract
infection. Clinical correlation is suggested.

4.  Moderate generalized anasarca, similar to prior examination.

5. Degenerative disease of the lumbar spine and severe bilateral hip
osteoarthritis.

## 2023-04-10 ENCOUNTER — Ambulatory Visit: Payer: Medicare HMO | Admitting: Urology

## 2023-04-10 IMAGING — DX DG CHEST 1V PORT
1 series · 1 of 1 positions shown · non-contrast
Comparison: 10/16/2021

CLINICAL DATA: Status post central line placement

EXAM:
PORTABLE CHEST 1 VIEW

[chest ap]
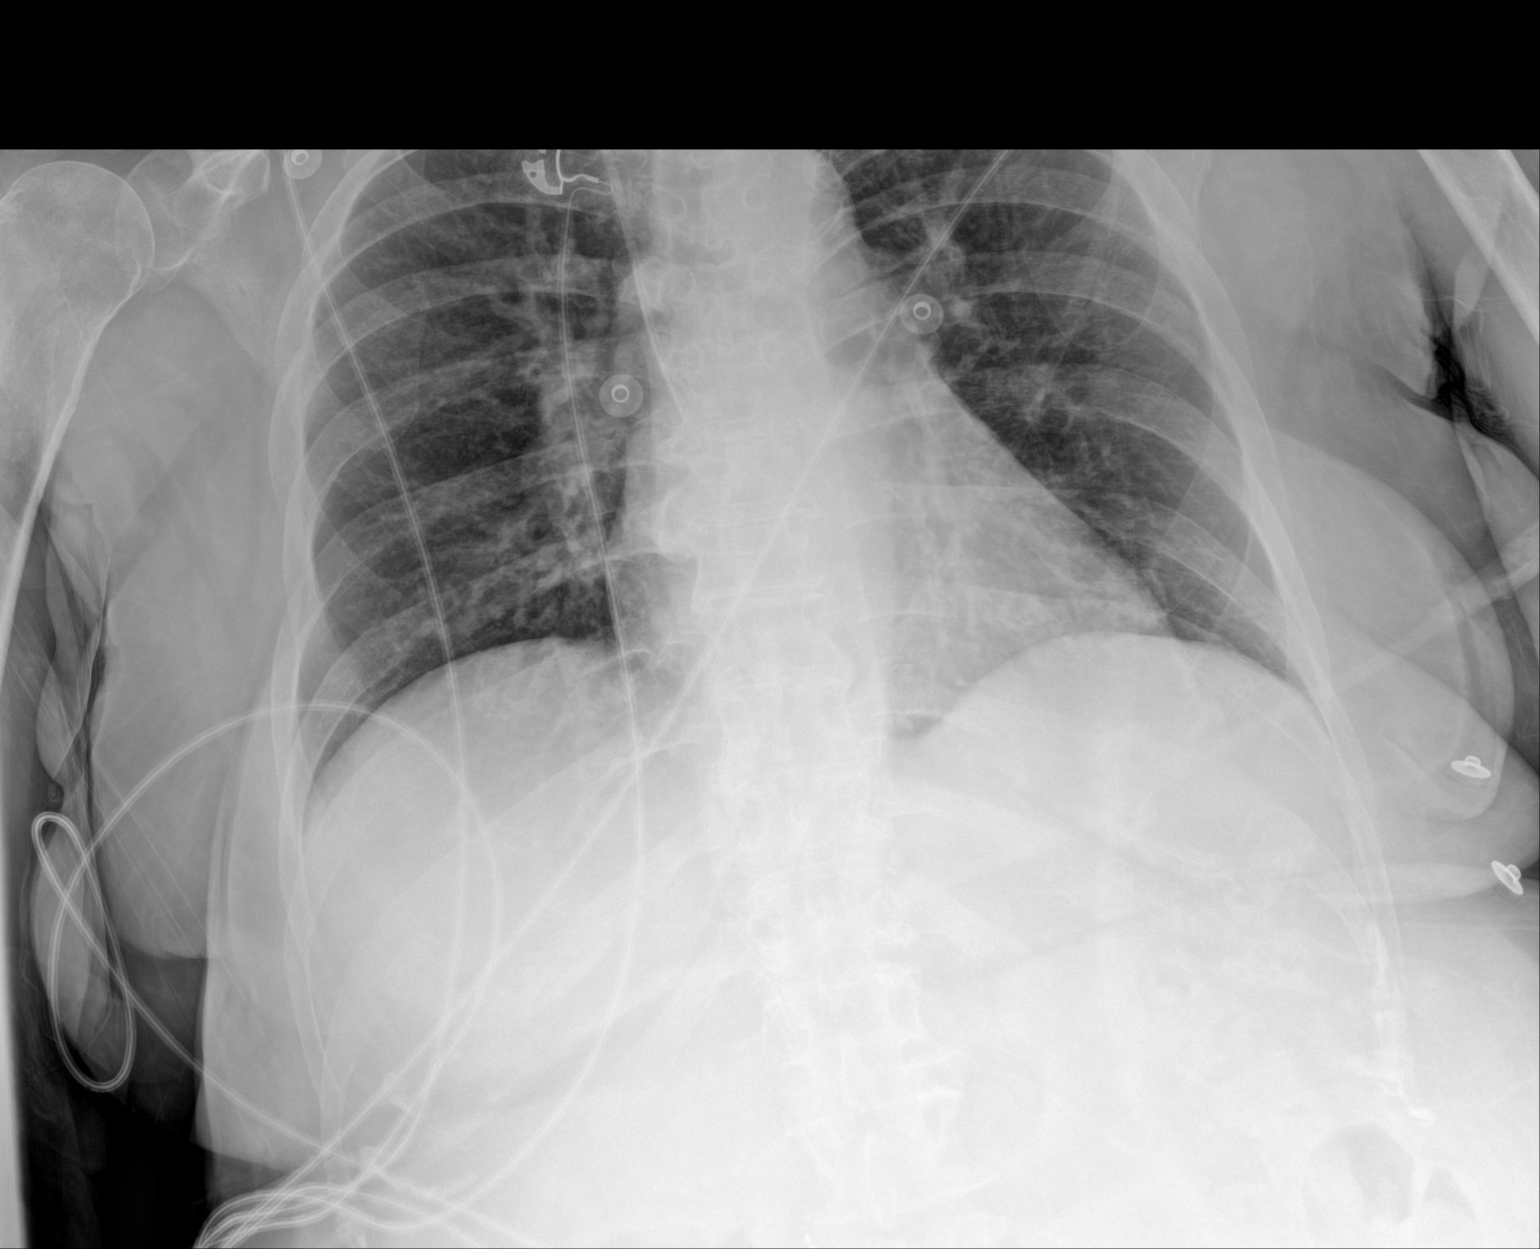

[1 of 1 positions shown; findings below may reference images not displayed]

FINDINGS: Cardiac shadow is stable. Lungs are clear bilaterally. Right jugular
central line is now noted in satisfactory position. No pneumothorax
is seen. No bony abnormality is noted.
IMPRESSION: No pneumothorax following central line placement.

## 2023-04-11 IMAGING — DX DG CHEST 1V PORT
1 series · 1 of 1 positions shown · non-contrast
Comparison: October 17, 2021.

CLINICAL DATA: Edema.

EXAM:
PORTABLE CHEST 1 VIEW

[chest ap]
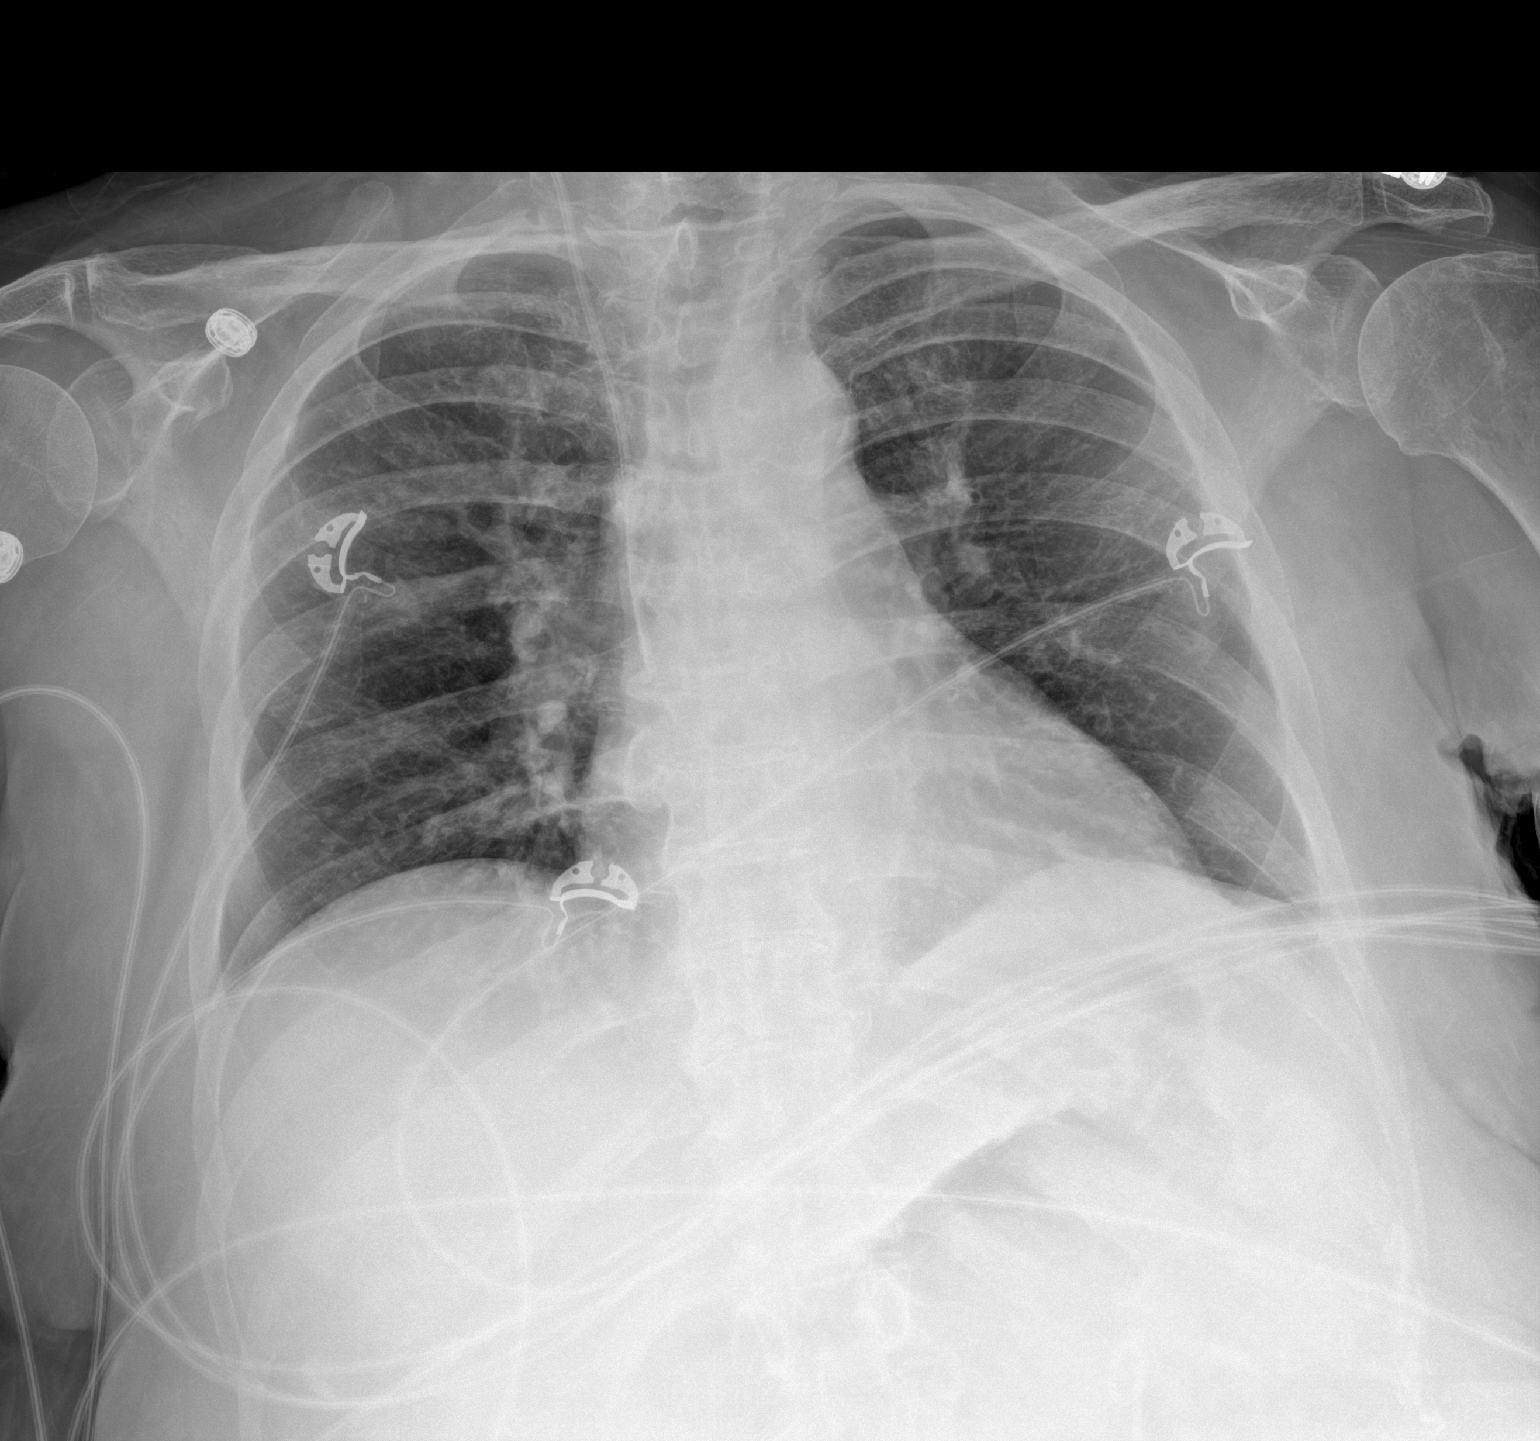

[1 of 1 positions shown; findings below may reference images not displayed]

FINDINGS: The heart size and mediastinal contours are within normal limits.
Both lungs are clear. Right internal jugular catheter is unchanged
in position. The visualized skeletal structures are unremarkable.
IMPRESSION: No active disease.

## 2023-04-12 LAB — CULTURE, BLOOD (ROUTINE X 2)
Culture: NO GROWTH
Culture: NO GROWTH
Special Requests: ADEQUATE
Special Requests: ADEQUATE

## 2023-04-14 ENCOUNTER — Ambulatory Visit: Payer: Medicare HMO | Admitting: Urology

## 2023-04-23 NOTE — Progress Notes (Deleted)
PCP: [facility]  Primary Cardiologist: Julien Nordmann, MD (last seen 03/23)  HPI:  Ms Costilow is a 67 y/o female with a history of DM, endometrial cancer, GERD, hyperlipidemia, HTN, COPD, recurrent DVT, extensive bilateral PE s/p embolectomy and IVC placement, lymphedema, ureteral stricture, anemia, thrombocytopenia, tobacco use and chronic heart failure.   Admitted 10/24/22 due to SOB d/t COPD exacerbation. Treated with steroids and antibiotics. Admitted 11/20/22 due to acute shortness of breath. Admitted on fluids, antibiotics for sepsis. Cardiology consulted for chest pain.  Transfused 1 unit PRBC's and received 2 doses of IV iron. Gram-negative UTI treated with 3 days of intravenous antibiotic. Received 3 days total of IV antibiotics. Percutaneous nephrostomy tube was exchanged due to acute pyelonephritis. Was in the ED 12/24/22 due to chest pain and shortness of breath and funny feeling in her head. Symptoms improved and she was released.   Echo 09/12/21: EF of 55-60% along with mild LVH/ LAE & mild MR. Echo 02/25/22: EF of 65-70% Echo 11/21/22: EF 60-65% along with Grade I DD and normal PA pressure of 17.6 mmHg.    She presents today for a HF follow-up visit with a chief complaint of minimal SOB with moderate exertion. Chronic in nature but worse with hot/ humid weather, Has associated fatigue, pedal edema and gradual weight gain along with this. Denies chest pain, cough, palpitations, dizziness, abdominal distention or difficulty sleeping.  Accidentally threw away the bag that had her crestor/ furosemide in it so wants to see if I can send in new RX's for both of these.   Weight at home stays around 190  ROS: All systems negative except as listed in HPI, PMH and Problem List.  SH:  Social History   Socioeconomic History   Marital status: Single    Spouse name: Not on file   Number of children: Not on file   Years of education: Not on file   Highest education  level: Not on file  Occupational History   Not on file  Tobacco Use   Smoking status: Never   Smokeless tobacco: Never   Tobacco comments:    smoked 2-4 cigarettes for about 2-3 weeks   Vaping Use   Vaping status: Never Used  Substance and Sexual Activity   Alcohol use: Not Currently   Drug use: Never   Sexual activity: Not Currently  Other Topics Concern   Not on file  Social History Narrative   Not on file   Social Determinants of Health   Financial Resource Strain: Not on file  Food Insecurity: No Food Insecurity (02/11/2023)   Hunger Vital Sign    Worried About Running Out of Food in the Last Year: Never true    Ran Out of Food in the Last Year: Never true  Transportation Needs: No Transportation Needs (02/11/2023)   PRAPARE - Administrator, Civil Service (Medical): No    Lack of Transportation (Non-Medical): No  Physical Activity: Not on file  Stress: Not on file  Social Connections: Not on file  Intimate Partner Violence: Not At Risk (02/11/2023)   Humiliation, Afraid, Rape, and Kick questionnaire    Fear of Current or Ex-Partner: No    Emotionally Abused: No    Physically Abused: No    Sexually Abused: No    FH:  Family History  Problem Relation Age of Onset   Hypertension Mother    Diabetes Mother    Diabetes Father    Hypertension Father  High Cholesterol Father    Congestive Heart Failure Father    Breast cancer Cousin     Past Medical History:  Diagnosis Date   Acute bilateral deep vein thrombosis (DVT) of femoral veins (HCC) 01/02/2021   Acute massive pulmonary embolism (HCC) 08/25/2020   Last Assessment & Plan:  Formatting of this note might be different from the original. Post vena caval filter and on xarelto and O2   Anemia    ARF (acute respiratory failure) (HCC)    Cellulitis of left lower leg 06/20/2021   CHF (congestive heart failure) (HCC)    COVID-19    Diabetes mellitus without complication (HCC)    DVT (deep venous  thrombosis) (HCC) 09/06/2020   Endometrial cancer (HCC)    GAVE (gastric antral vascular ectasia)    GERD (gastroesophageal reflux disease)    High cholesterol    Hx of blood clots    Hyperlipidemia    Hypertension    IDA (iron deficiency anemia) 09/21/2020   Obesity    Pneumonia 06/11/2022   recovered   Pressure injury of skin 06/21/2021   Pulmonary embolism (HCC)    Sepsis (HCC)    Thrombocytopenia (HCC)    Urinary tract infection 09/13/2021    Current Outpatient Medications  Medication Sig Dispense Refill   acetaminophen (TYLENOL) 500 MG tablet Take 500 mg by mouth every 6 (six) hours as needed for mild pain or fever.     albuterol (VENTOLIN HFA) 108 (90 Base) MCG/ACT inhaler Inhale 2 puffs into the lungs every 6 (six) hours as needed for wheezing or shortness of breath. 8 g 0   Ascorbic Acid (VITAMIN C) 1000 MG tablet Take 1,000 mg by mouth daily.     FEROSUL 325 (65 Fe) MG tablet Take 325 mg by mouth daily with breakfast.     fluticasone furoate-vilanterol (BREO ELLIPTA) 100-25 MCG/ACT AEPB Inhale 1 puff into the lungs daily.     midodrine (PROAMATINE) 5 MG tablet Take 1 tablet (5 mg total) by mouth 3 (three) times daily with meals. (Patient not taking: Reported on 04/07/2023) 90 tablet 1   Multiple Vitamin (MULTIVITAMIN WITH MINERALS) TABS tablet Take 1 tablet by mouth daily.     pantoprazole (PROTONIX) 40 MG tablet Take 1 tablet (40 mg total) by mouth 2 (two) times daily. 60 tablet 1   rosuvastatin (CRESTOR) 10 MG tablet Take 1 tablet (10 mg total) by mouth daily. 30 tablet 5   simethicone (MYLICON) 80 MG chewable tablet Chew 1 tablet (80 mg total) by mouth every 6 (six) hours as needed for flatulence. 30 tablet 0   triamcinolone cream (KENALOG) 0.1 % Apply 1 Application topically 2 (two) times daily as needed.     No current facility-administered medications for this visit.   There were no vitals filed for this visit.  Wt Readings from Last 3 Encounters:  04/05/23 195 lb  (88.5 kg)  02/10/23 190 lb (86.2 kg)  01/21/23 190 lb (86.2 kg)   Lab Results  Component Value Date   CREATININE 0.93 04/08/2023   CREATININE 0.97 04/07/2023   CREATININE 0.86 04/06/2023   PHYSICAL EXAM:  General:  Well appearing. No resp difficulty HEENT: normal Neck: supple. JVP flat. No lymphadenopathy or thryomegaly appreciated. Cor: PMI normal. Regular rate & rhythm. No rubs, gallops or murmurs. Lungs: clear Abdomen: soft, nontender, nondistended. No hepatosplenomegaly. No bruits or masses.  Extremities: no cyanosis, clubbing, rash, edema Neuro: alert & orientedx3, cranial nerves grossly intact. Moves all 4 extremities w/o difficulty.  Affect pleasant.   ECG: not done   ASSESSMENT & PLAN:  1: NICM with preserved ejection fraction- - suspect due to HTN - NYHA class II - euvolemic today - weighing daily; reminded to call for an overnight weight gain of > 2 pounds or a weekly weight gain of > 5 pounds - weight up 16 pounds from last visit here 2 months ago but patient says her weight at home stays around 190 pounds; question accuracy of last visit's weight - Echo 09/12/21: EF of 55-60% along with mild LVH/ LAE & mild MR.  - Echo 02/25/22: EF of 65-70%.  - Echo 11/21/22: EF 60-65% along with Grade I DD and normal PA pressure of 17.6 mmHg. - not adding salt except on rare occasions;  - saw cardiology Mariah Milling) 03/23; most recently during recent admission 05/24; put on her AVS for them to call and get f/u scheduled - continue furosemide 20mg  daily with additional 20mg  if needed - recurrent UTI's so not a candidate for SGLT2 - reviewed the importance of keeping her total daily fluid intake to 60-64oz and that, currently, she's drinking >100 oz daily - BNP 11/20/22 was 41.4   2: HTN- - BP 96/48 - follows with PCP at [facility] & returns 07/24 - BMP 12/24/22 reviewed and showed sodium 140, potassium 3.4, creatinine 0.97 and GFR >60   3: DM- - A1c 11/16/21 was  5.7%   4: Anemia- - hemoglobin 12/24/22 was 8.9 - saw hematology Cathie Hoops) 06/24 - iron infusion done 10/08/22  5: Left lower extremity DVT- - has IVC in place - unable to tolerate anticoagulation due to high bleeding risk   6: Recurrent UTI- - saw urology Lonna Cobb) 10/23 - underwent nephrogram and nephrostomy tube exchange in the past - not a candidate for SGLT2  Return in 3 months, sooner if needed.

## 2023-04-24 ENCOUNTER — Encounter: Payer: Medicare HMO | Admitting: Family

## 2023-04-28 ENCOUNTER — Encounter: Payer: Self-pay | Admitting: Oncology

## 2023-04-28 ENCOUNTER — Inpatient Hospital Stay: Payer: Medicare HMO | Admitting: Oncology

## 2023-04-28 ENCOUNTER — Inpatient Hospital Stay: Payer: Medicare HMO

## 2023-04-28 NOTE — Assessment & Plan Note (Deleted)
#  Iron deficiency anemia,  Labs are reviewed and discussed with patient. Lab Results  Component Value Date   HGB 8.8 (L) 04/08/2023   TIBC 363 03/24/2023   IRONPCTSAT 63 (H) 03/24/2023   FERRITIN 13 03/24/2023    Recommend  IV Venofer weekly x 4 Recommend patient to reestablish care with gastroenterology.

## 2023-04-30 ENCOUNTER — Encounter: Payer: Medicare HMO | Admitting: Family

## 2023-04-30 ENCOUNTER — Telehealth: Payer: Self-pay | Admitting: Family

## 2023-04-30 NOTE — Telephone Encounter (Signed)
Patient did not show for her Heart Failure Clinic appointment on 04/30/23.

## 2023-04-30 NOTE — Progress Notes (Deleted)
PCP: [facility]  Primary Cardiologist: Julien Nordmann, MD (last seen 03/23)  HPI:  Courtney Mack is a 67 y/o female with a history of DM, endometrial cancer, GERD, hyperlipidemia, HTN, COPD, recurrent DVT, extensive bilateral PE s/p embolectomy and IVC placement, lymphedema, ureteral stricture, anemia, thrombocytopenia, tobacco use and chronic heart failure.   Admitted 10/24/22 due to SOB d/t COPD exacerbation. Treated with steroids and antibiotics. Admitted 11/20/22 due to acute shortness of breath. Admitted on fluids, antibiotics for sepsis. Cardiology consulted for chest pain.  Transfused 1 unit PRBC's and received 2 doses of IV iron. Gram-negative UTI treated with 3 days of intravenous antibiotic. Received 3 days total of IV antibiotics. Percutaneous nephrostomy tube was exchanged due to acute pyelonephritis. Was in the ED 12/24/22 due to chest pain and shortness of breath and funny feeling in her head. Symptoms improved and she was released.   Admitted 02/10/23 due to fever and SOB. +UTI. Given IV antibiotics & IVF. Overnight persistent hypertension, some improvement with MAP of 62-64 now. Lactic acidosis resolved. Hemoglobin decreased to 8, baseline appears to be around 8-8.9. Urology consult for possible nephrostomy tube exchange. Started midodrine due to soft BP's. Admitted 04/05/23 due to left flank pain and decreased output from left nephrostomy tube. She reports that for several days prior to onset of the flank pain and decreased output she was seeing a lot of debris in the drainage bag. IR exchange which was performed hospital day 1. Patient spiked a fever that day and experienced ongoing flank pain so she was treated for presumed complicated UTI with ceftriaxone. Patient experienced persistent flank pain on hospital day 2 so CT of the abdomen and pelvis was performed which showed no infectious or other acute complications.   Echo 08/19/20:EF 60-65% with Grade I DD Echo  09/12/21: EF of 55-60% along with mild LVH/ LAE & mild MR. Echo 02/25/22: EF of 65-70% Echo 11/21/22: EF 60-65% along with Grade I DD and normal PA pressure of 17.6 mmHg.    She presents today for a HF follow-up visit with a chief complaint of     ROS: All systems negative except as listed in HPI, PMH and Problem List.  SH:  Social History   Socioeconomic History   Marital status: Single    Spouse name: Not on file   Number of children: Not on file   Years of education: Not on file   Highest education level: Not on file  Occupational History   Not on file  Tobacco Use   Smoking status: Never   Smokeless tobacco: Never   Tobacco comments:    smoked 2-4 cigarettes for about 2-3 weeks   Vaping Use   Vaping status: Never Used  Substance and Sexual Activity   Alcohol use: Not Currently   Drug use: Never   Sexual activity: Not Currently  Other Topics Concern   Not on file  Social History Narrative   Not on file   Social Determinants of Health   Financial Resource Strain: Not on file  Food Insecurity: No Food Insecurity (02/11/2023)   Hunger Vital Sign    Worried About Running Out of Food in the Last Year: Never true    Ran Out of Food in the Last Year: Never true  Transportation Needs: No Transportation Needs (02/11/2023)   PRAPARE - Administrator, Civil Service (Medical): No    Lack of Transportation (Non-Medical): No  Physical Activity: Not on file  Stress: Not on  file  Social Connections: Not on file  Intimate Partner Violence: Not At Risk (02/11/2023)   Humiliation, Afraid, Rape, and Kick questionnaire    Fear of Current or Ex-Partner: No    Emotionally Abused: No    Physically Abused: No    Sexually Abused: No    FH:  Family History  Problem Relation Age of Onset   Hypertension Mother    Diabetes Mother    Diabetes Father    Hypertension Father    High Cholesterol Father    Congestive Heart Failure Father    Breast cancer Cousin     Past Medical  History:  Diagnosis Date   Acute bilateral deep vein thrombosis (DVT) of femoral veins (HCC) 01/02/2021   Acute massive pulmonary embolism (HCC) 08/25/2020   Last Assessment & Plan:  Formatting of this note might be different from the original. Post vena caval filter and on xarelto and O2   Anemia    ARF (acute respiratory failure) (HCC)    Cellulitis of left lower leg 06/20/2021   CHF (congestive heart failure) (HCC)    COVID-19    Diabetes mellitus without complication (HCC)    DVT (deep venous thrombosis) (HCC) 09/06/2020   Endometrial cancer (HCC)    GAVE (gastric antral vascular ectasia)    GERD (gastroesophageal reflux disease)    High cholesterol    Hx of blood clots    Hyperlipidemia    Hypertension    IDA (iron deficiency anemia) 09/21/2020   Obesity    Pneumonia 06/11/2022   recovered   Pressure injury of skin 06/21/2021   Pulmonary embolism (HCC)    Sepsis (HCC)    Thrombocytopenia (HCC)    Urinary tract infection 09/13/2021    Current Outpatient Medications  Medication Sig Dispense Refill   acetaminophen (TYLENOL) 500 MG tablet Take 500 mg by mouth every 6 (six) hours as needed for mild pain or fever.     albuterol (VENTOLIN HFA) 108 (90 Base) MCG/ACT inhaler Inhale 2 puffs into the lungs every 6 (six) hours as needed for wheezing or shortness of breath. 8 g 0   Ascorbic Acid (VITAMIN C) 1000 MG tablet Take 1,000 mg by mouth daily.     FEROSUL 325 (65 Fe) MG tablet Take 325 mg by mouth daily with breakfast.     fluticasone furoate-vilanterol (BREO ELLIPTA) 100-25 MCG/ACT AEPB Inhale 1 puff into the lungs daily.     midodrine (PROAMATINE) 5 MG tablet Take 1 tablet (5 mg total) by mouth 3 (three) times daily with meals. (Patient not taking: Reported on 04/07/2023) 90 tablet 1   Multiple Vitamin (MULTIVITAMIN WITH MINERALS) TABS tablet Take 1 tablet by mouth daily.     pantoprazole (PROTONIX) 40 MG tablet Take 1 tablet (40 mg total) by mouth 2 (two) times daily. 60  tablet 1   rosuvastatin (CRESTOR) 10 MG tablet Take 1 tablet (10 mg total) by mouth daily. 30 tablet 5   simethicone (MYLICON) 80 MG chewable tablet Chew 1 tablet (80 mg total) by mouth every 6 (six) hours as needed for flatulence. 30 tablet 0   triamcinolone cream (KENALOG) 0.1 % Apply 1 Application topically 2 (two) times daily as needed.     No current facility-administered medications for this visit.    PHYSICAL EXAM:  General:  Well appearing. No resp difficulty HEENT: normal Neck: supple. JVP flat. No lymphadenopathy or thryomegaly appreciated. Cor: PMI normal. Regular rate & rhythm. No rubs, gallops or murmurs. Lungs: clear Abdomen: soft, nontender,  nondistended. No hepatosplenomegaly. No bruits or masses.  Extremities: no cyanosis, clubbing, rash, edema Neuro: alert & orientedx3, cranial nerves grossly intact. Moves all 4 extremities w/o difficulty. Affect pleasant.   ECG: not done   ASSESSMENT & PLAN:  1: NICM with preserved ejection fraction- - suspect due to HTN - NYHA class II - euvolemic today - weighing daily; reminded to call for an overnight weight gain of > 2 pounds or a weekly weight gain of > 5 pounds - weight 190 pounds from last visit here 3 months ago  - Echo 09/12/21: EF of 55-60% along with mild LVH/ LAE & mild MR.  - Echo 02/25/22: EF of 65-70%.  - Echo 11/21/22: EF 60-65% along with Grade I DD and normal PA pressure of 17.6 mmHg. - not adding salt except on rare occasions;  - saw cardiology Mariah Milling) 03/23; most recently during recent admission 05/24; put on her AVS for them to call and get f/u scheduled - continue furosemide 20mg  daily with additional 20mg  if needed - recurrent UTI's so not a candidate for SGLT2 - reviewed the importance of keeping her total daily fluid intake to 60-64oz and that, currently, she's drinking >100 oz daily - BNP 11/20/22 was 41.4   2: HTN- - BP  - follows with PCP at [facility] & returns 07/24 - BMP  04/08/23 reviewed and showed sodium 135, potassium 3.3, creatinine 0.93 and GFR >60   3: DM- - A1c 11/16/21 was 5.7%   4: Anemia- - hemoglobin 12/24/22 was 8.9 - saw hematology Cathie Hoops) 06/24 - iron infusion done 10/08/22  5: Left lower extremity DVT- - has IVC in place - unable to tolerate anticoagulation due to high bleeding risk   6: Recurrent UTI- - saw urology Lonna Cobb) 10/23 - underwent nephrogram and nephrostomy tube exchange in the past - not a candidate for SGLT2

## 2023-05-01 ENCOUNTER — Telehealth: Payer: Self-pay | Admitting: Family

## 2023-05-01 ENCOUNTER — Encounter: Payer: Medicare HMO | Admitting: Family

## 2023-05-01 ENCOUNTER — Other Ambulatory Visit: Payer: Self-pay

## 2023-05-01 DIAGNOSIS — D500 Iron deficiency anemia secondary to blood loss (chronic): Secondary | ICD-10-CM

## 2023-05-01 NOTE — Telephone Encounter (Signed)
Patient did not show for her Heart Failure Clinic appointment on 05/01/23. This is the 2nd appt she has missed in the last 2 days.

## 2023-05-02 ENCOUNTER — Encounter: Payer: Self-pay | Admitting: Oncology

## 2023-05-02 ENCOUNTER — Inpatient Hospital Stay: Payer: Medicare HMO | Attending: Oncology

## 2023-05-05 ENCOUNTER — Encounter: Payer: Self-pay | Admitting: Oncology

## 2023-05-05 ENCOUNTER — Inpatient Hospital Stay: Payer: Medicare HMO | Admitting: Oncology

## 2023-05-05 ENCOUNTER — Other Ambulatory Visit: Payer: Medicare HMO

## 2023-05-05 ENCOUNTER — Ambulatory Visit: Payer: Medicare HMO | Admitting: Urology

## 2023-05-05 ENCOUNTER — Inpatient Hospital Stay: Payer: Medicare HMO

## 2023-05-05 NOTE — Progress Notes (Deleted)
NO SHOW

## 2023-05-06 ENCOUNTER — Ambulatory Visit: Payer: Medicare HMO | Attending: Cardiovascular Disease | Admitting: Cardiovascular Disease

## 2023-05-06 DIAGNOSIS — Z86718 Personal history of other venous thrombosis and embolism: Secondary | ICD-10-CM

## 2023-05-06 DIAGNOSIS — Z86711 Personal history of pulmonary embolism: Secondary | ICD-10-CM

## 2023-05-06 DIAGNOSIS — I5032 Chronic diastolic (congestive) heart failure: Secondary | ICD-10-CM

## 2023-05-06 DIAGNOSIS — N390 Urinary tract infection, site not specified: Secondary | ICD-10-CM

## 2023-05-06 DIAGNOSIS — D649 Anemia, unspecified: Secondary | ICD-10-CM

## 2023-05-06 DIAGNOSIS — I10 Essential (primary) hypertension: Secondary | ICD-10-CM

## 2023-05-06 DIAGNOSIS — I890 Lymphedema, not elsewhere classified: Secondary | ICD-10-CM

## 2023-05-06 DIAGNOSIS — E119 Type 2 diabetes mellitus without complications: Secondary | ICD-10-CM

## 2023-05-07 ENCOUNTER — Encounter: Payer: Self-pay | Admitting: Cardiovascular Disease

## 2023-05-14 ENCOUNTER — Ambulatory Visit: Payer: Medicare HMO | Admitting: Urology

## 2023-05-16 ENCOUNTER — Encounter: Payer: Self-pay | Admitting: Urology

## 2023-05-19 ENCOUNTER — Inpatient Hospital Stay: Payer: Medicare HMO | Attending: Oncology

## 2023-05-21 ENCOUNTER — Inpatient Hospital Stay: Payer: Medicare HMO

## 2023-05-21 ENCOUNTER — Inpatient Hospital Stay: Payer: Medicare HMO | Admitting: Oncology

## 2023-05-23 ENCOUNTER — Encounter: Payer: Self-pay | Admitting: *Deleted

## 2023-05-25 NOTE — Progress Notes (Deleted)
Cardiology Clinic Note   Date: 05/25/2023 ID: JOPLYN TATAR, DOB 25-Jan-1956, MRN [id number]  Primary Cardiologist:  None  Patient Profile    Courtney Mack is a 67 y.o. female who presents to the clinic today for ***    Past medical history significant for: Chronic diastolic heart failure. Echo 11/21/2022: EF 60 to 65%.  No RWMA.  Grade I DD.  Normal RV size/function.  Normal PA pressure. Hypertension. Hyperlipidemia. COPD. PE/DVT. Mechanical thrombectomy PE 08/18/2020. IVC filter insertion. Mechanical thrombectomy bilateral common femoral, external and common iliac veins and IVC.  PTA left and right common iliac vein 01/03/2021. GERD. Endometrial cancer. GI bleed. 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 Lymphedema. Chronic anemia due to gastric antral vascular ectasia. Left nephrostomy tube.  In summary, patient has a history of PE/DVT s/p IVC filter insertion and the above detailed thrombectomy procedures in February and June 2022 no longer on [facility] secondary to recurrent anemia.  Patient had GI bleeding in January 2023 for which she underwent embolization as detailed above.  Patient was first evaluated by Dr. Mariah Milling on 10/04/2021 after hospital admission for volume overload.  Echo at that time showed normal LV/RV function, mild asymmetric LVH of the basal-septal segment, mild LAE, mild MR.  Patient's daughter voiced concerns regarding chronic anemia monitoring and it was recommended patient establish with hematology.  She was instructed to continue daily Lasix and moderate fluid intake.     History of Present Illness    Courtney Mack is followed by Dr. Mariah Milling and the heart failure clinic for the above outlined history.  Patient with multiple hospital admissions/ED visits in 2024: 08/07/2022 to 08/10/2022: Sent to ED by PCP for hemoglobin 5.1 with complaints  of generalized weakness, worsening DOE, fatigue.  Transfused 3 units PRBC and 2 doses of IV iron. 10/24/2022 to 10/26/2022: Admitted for COPD exacerbation.  Found to have UTI as well.  She was not volume overloaded at that time. 11/21/2022 to 11/23/2022: Admitted for sepsis.  Cardiology consulted for chest pain.  Echo showed normal LV/RV function, no RWMA, Grade I DD, normal PA pressure.  She was diuresed with IV Lasix after transfusion for anemia.  Symptoms improved following transfusion of PRBC and IV iron.  Consider ischemic evaluation as an outpatient. ED visit 12/24/2022: Complaints of chest pain.  EKG without acute ischemic changes.  Troponin negative x 2.  Patient discharged home. 02/10/2023 to 02/12/2023: Presented with complaints of fever, shortness of breath, weakness, dizziness.  Admitted for sepsis. 04/06/2023 to 04/08/2023: Presented with complaint of no urine output from left nephrostomy tube. Admitted for blocked nephrostomy tube.  Patient was last seen by heart failure clinic on 11/07/2022 for routine follow-up.  She reported drinking > 100 ounces of fluids daily.  She was educated to moderate fluids to 60 to 64 ounces daily.  No medication changes were made.  Patient no showed heart failure follow-ups x 2 in October.   Discussed the use of AI scribe software for clinical note transcription with the patient, who gave verbal consent to proceed.         Chronic diastolic heart failure Echo May 2024 showed normal LV/RV function, Grade I DD, normal PA pressure.  Patient*** Euvolemic and well compensated on exam. -Continue***  -Patient not a candidate for SGLT2i secondary to recurrent UTIs.  Chest pain ***  Hypertension BP today***  Hyperlipidemia ***  Chronic  anemia Hemoglobin 04/08/2023 8.8.  Patient*** -Continue to follow with heme-onc.    ROS: All other systems reviewed and are otherwise negative except as noted in History of Present Illness.  Studies Reviewed        ***  Risk Assessment/Calculations    {Does this patient have ATRIAL FIBRILLATION?:[phone]} No BP recorded.  {Refresh Note OR Click here to enter BP  :1}***        Physical Exam    VS:  There were no vitals taken for this visit. , BMI There is no height or weight on file to calculate BMI.  GEN: Well nourished, well developed, in no acute distress. Neck: No JVD or carotid bruits. Cardiac: *** RRR. No murmurs. No rubs or gallops.   Respiratory:  Respirations regular and unlabored. Clear to auscultation without rales, wheezing or rhonchi. GI: Soft, nontender, nondistended. Extremities: Radials/DP/PT 2+ and equal bilaterally. No clubbing or cyanosis. No edema ***  Skin: Warm and dry, no rash. Neuro: Strength intact.  Assessment & Plan   Assessment and Plan               Disposition: ***     {Are you ordering a CV Procedure (e.g. stress test, cath, DCCV, TEE, etc)?   Press F2        :[id number]}   Signed, Etta Grandchild. Karam Dunson, DNP, NP-C

## 2023-05-26 ENCOUNTER — Inpatient Hospital Stay
Admission: EM | Admit: 2023-05-26 | Discharge: 2023-05-29 | DRG: 699 | Disposition: A | Discharge status: Home / Self Care | Payer: Medicare HMO | Attending: Internal Medicine | Admitting: Internal Medicine

## 2023-05-26 ENCOUNTER — Ambulatory Visit: Payer: Medicare HMO | Attending: Student | Admitting: Student

## 2023-05-26 ENCOUNTER — Other Ambulatory Visit: Payer: Self-pay

## 2023-05-26 ENCOUNTER — Encounter: Payer: Self-pay | Admitting: Emergency Medicine

## 2023-05-26 DIAGNOSIS — Z23 Encounter for immunization: Secondary | ICD-10-CM

## 2023-05-26 DIAGNOSIS — Z86718 Personal history of other venous thrombosis and embolism: Secondary | ICD-10-CM | POA: Diagnosis not present

## 2023-05-26 DIAGNOSIS — Z833 Family history of diabetes mellitus: Secondary | ICD-10-CM

## 2023-05-26 DIAGNOSIS — Z803 Family history of malignant neoplasm of breast: Secondary | ICD-10-CM | POA: Diagnosis not present

## 2023-05-26 DIAGNOSIS — D509 Iron deficiency anemia, unspecified: Secondary | ICD-10-CM | POA: Diagnosis present

## 2023-05-26 DIAGNOSIS — Z86711 Personal history of pulmonary embolism: Secondary | ICD-10-CM | POA: Diagnosis present

## 2023-05-26 DIAGNOSIS — N1330 Unspecified hydronephrosis: Secondary | ICD-10-CM | POA: Diagnosis not present

## 2023-05-26 DIAGNOSIS — Z8249 Family history of ischemic heart disease and other diseases of the circulatory system: Secondary | ICD-10-CM

## 2023-05-26 DIAGNOSIS — Y732 Prosthetic and other implants, materials and accessory gastroenterology and urology devices associated with adverse incidents: Secondary | ICD-10-CM | POA: Diagnosis present

## 2023-05-26 DIAGNOSIS — T83012A Breakdown (mechanical) of nephrostomy catheter, initial encounter: Principal | ICD-10-CM | POA: Diagnosis present

## 2023-05-26 DIAGNOSIS — K219 Gastro-esophageal reflux disease without esophagitis: Secondary | ICD-10-CM | POA: Diagnosis present

## 2023-05-26 DIAGNOSIS — R197 Diarrhea, unspecified: Secondary | ICD-10-CM | POA: Diagnosis present

## 2023-05-26 DIAGNOSIS — I10 Essential (primary) hypertension: Secondary | ICD-10-CM | POA: Diagnosis present

## 2023-05-26 DIAGNOSIS — Z79899 Other long term (current) drug therapy: Secondary | ICD-10-CM

## 2023-05-26 DIAGNOSIS — E119 Type 2 diabetes mellitus without complications: Secondary | ICD-10-CM | POA: Diagnosis present

## 2023-05-26 DIAGNOSIS — N390 Urinary tract infection, site not specified: Principal | ICD-10-CM | POA: Diagnosis present

## 2023-05-26 DIAGNOSIS — E66812 Obesity, class 2: Secondary | ICD-10-CM | POA: Diagnosis present

## 2023-05-26 DIAGNOSIS — I110 Hypertensive heart disease with heart failure: Secondary | ICD-10-CM | POA: Diagnosis present

## 2023-05-26 DIAGNOSIS — Z8349 Family history of other endocrine, nutritional and metabolic diseases: Secondary | ICD-10-CM | POA: Diagnosis not present

## 2023-05-26 DIAGNOSIS — N136 Pyonephrosis: Secondary | ICD-10-CM | POA: Diagnosis present

## 2023-05-26 DIAGNOSIS — Z6839 Body mass index (BMI) 39.0-39.9, adult: Secondary | ICD-10-CM | POA: Diagnosis not present

## 2023-05-26 DIAGNOSIS — Z6841 Body Mass Index (BMI) 40.0 and over, adult: Secondary | ICD-10-CM

## 2023-05-26 DIAGNOSIS — J449 Chronic obstructive pulmonary disease, unspecified: Secondary | ICD-10-CM | POA: Diagnosis present

## 2023-05-26 DIAGNOSIS — K31819 Angiodysplasia of stomach and duodenum without bleeding: Secondary | ICD-10-CM | POA: Diagnosis present

## 2023-05-26 DIAGNOSIS — I5032 Chronic diastolic (congestive) heart failure: Secondary | ICD-10-CM | POA: Diagnosis present

## 2023-05-26 DIAGNOSIS — Z7951 Long term (current) use of inhaled steroids: Secondary | ICD-10-CM | POA: Diagnosis not present

## 2023-05-26 DIAGNOSIS — E785 Hyperlipidemia, unspecified: Secondary | ICD-10-CM | POA: Diagnosis present

## 2023-05-26 DIAGNOSIS — Z8616 Personal history of COVID-19: Secondary | ICD-10-CM

## 2023-05-26 DIAGNOSIS — E7800 Pure hypercholesterolemia, unspecified: Secondary | ICD-10-CM | POA: Diagnosis present

## 2023-05-26 DIAGNOSIS — Z936 Other artificial openings of urinary tract status: Secondary | ICD-10-CM | POA: Diagnosis not present

## 2023-05-26 DIAGNOSIS — E669 Obesity, unspecified: Secondary | ICD-10-CM | POA: Diagnosis present

## 2023-05-26 LAB — CBC
HCT: 35.3 % — ABNORMAL LOW (ref 36.0–46.0)
Hemoglobin: 10.7 g/dL — ABNORMAL LOW (ref 12.0–15.0)
MCH: 28.5 pg (ref 26.0–34.0)
MCHC: 30.3 g/dL (ref 30.0–36.0)
MCV: 94.1 fL (ref 80.0–100.0)
Platelets: 267 10*3/uL (ref 150–400)
RBC: 3.75 MIL/uL — ABNORMAL LOW (ref 3.87–5.11)
RDW: 15.2 % (ref 11.5–15.5)
WBC: 5.3 10*3/uL (ref 4.0–10.5)
nRBC: 0 % (ref 0.0–0.2)

## 2023-05-26 LAB — URINALYSIS, ROUTINE W REFLEX MICROSCOPIC
Bilirubin Urine: NEGATIVE
Glucose, UA: NEGATIVE mg/dL
Hgb urine dipstick: NEGATIVE
Ketones, ur: NEGATIVE mg/dL
Nitrite: NEGATIVE
Protein, ur: >=300 mg/dL — AB
Specific Gravity, Urine: 1.015 (ref 1.005–1.030)
WBC, UA: >50 WBC/hpf (ref 0–5)
pH: 8 (ref 5.0–8.0)

## 2023-05-26 LAB — BASIC METABOLIC PANEL
Anion gap: 9 (ref 5–15)
BUN: 27 mg/dL — ABNORMAL HIGH (ref 8–23)
CO2: 25 mmol/L (ref 22–32)
Calcium: 9.1 mg/dL (ref 8.9–10.3)
Chloride: 104 mmol/L (ref 98–111)
Creatinine, Ser: 1.05 mg/dL — ABNORMAL HIGH (ref 0.44–1.00)
GFR, Estimated: 58 mL/min — ABNORMAL LOW (ref >60)
Glucose, Bld: 151 mg/dL — ABNORMAL HIGH (ref 70–99)
Potassium: 3.8 mmol/L (ref 3.5–5.1)
Sodium: 138 mmol/L (ref 135–145)

## 2023-05-26 LAB — BRAIN NATRIURETIC PEPTIDE: B Natriuretic Peptide: 47 pg/mL (ref 0.0–100.0)

## 2023-05-26 MED ORDER — MOMETASONE FURO-FORMOTEROL FUM 100-5 MCG/ACT IN AERO
2.0000 | INHALATION_SPRAY | Freq: Two times a day (BID) | RESPIRATORY_TRACT | Status: DC
  Administered 2023-05-27 – 2023-05-29 (×5): 2 via RESPIRATORY_TRACT
  Filled 2023-05-26: qty 8.8

## 2023-05-26 MED ORDER — ROSUVASTATIN CALCIUM 10 MG PO TABS
10.0000 mg | ORAL_TABLET | Freq: Every day | ORAL | Status: DC
  Administered 2023-05-27 – 2023-05-29 (×3): 10 mg via ORAL
  Filled 2023-05-26 (×3): qty 1

## 2023-05-26 MED ORDER — FERROUS SULFATE 325 (65 FE) MG PO TABS
325.0000 mg | ORAL_TABLET | Freq: Every day | ORAL | Status: DC
  Administered 2023-05-27 – 2023-05-29 (×3): 325 mg via ORAL
  Filled 2023-05-26 (×3): qty 1

## 2023-05-26 MED ORDER — VITAMIN C 500 MG PO TABS
1000.0000 mg | ORAL_TABLET | Freq: Every day | ORAL | Status: DC
  Administered 2023-05-27 – 2023-05-29 (×3): 1000 mg via ORAL
  Filled 2023-05-26 (×3): qty 2

## 2023-05-26 MED ORDER — SODIUM CHLORIDE 0.9 % IV SOLN
2.0000 g | INTRAVENOUS | Status: DC
  Administered 2023-05-27 – 2023-05-28 (×2): 2 g via INTRAVENOUS
  Filled 2023-05-26 (×2): qty 20

## 2023-05-26 MED ORDER — ALBUTEROL SULFATE (2.5 MG/3ML) 0.083% IN NEBU: 2.5000 mg | INHALATION_SOLUTION | RESPIRATORY_TRACT | Status: DC | PRN

## 2023-05-26 MED ORDER — ADULT MULTIVITAMIN W/MINERALS CH
1.0000 | ORAL_TABLET | Freq: Every day | ORAL | Status: DC
  Administered 2023-05-27 – 2023-05-29 (×3): 1 via ORAL
  Filled 2023-05-26 (×3): qty 1

## 2023-05-26 MED ORDER — SIMETHICONE 80 MG PO CHEW: 80.0000 mg | CHEWABLE_TABLET | Freq: Four times a day (QID) | ORAL | Status: DC | PRN

## 2023-05-26 MED ORDER — DM-GUAIFENESIN ER 30-600 MG PO TB12: 1.0000 | ORAL_TABLET | Freq: Two times a day (BID) | ORAL | Status: DC | PRN

## 2023-05-26 MED ORDER — ONDANSETRON HCL 4 MG/2ML IJ SOLN: 4.0000 mg | Freq: Three times a day (TID) | INTRAMUSCULAR | Status: DC | PRN

## 2023-05-26 MED ORDER — MIDODRINE HCL 5 MG PO TABS
10.0000 mg | ORAL_TABLET | Freq: Three times a day (TID) | ORAL | Status: DC
  Administered 2023-05-26 – 2023-05-29 (×8): 10 mg via ORAL
  Filled 2023-05-26 (×8): qty 2

## 2023-05-26 MED ORDER — FLUTICASONE FUROATE-VILANTEROL 100-25 MCG/ACT IN AEPB
1.0000 | INHALATION_SPRAY | Freq: Every day | RESPIRATORY_TRACT | Status: DC
  Administered 2023-05-27 – 2023-05-29 (×3): 1 via RESPIRATORY_TRACT
  Filled 2023-05-26: qty 28

## 2023-05-26 MED ORDER — HYDRALAZINE HCL 20 MG/ML IJ SOLN: 5.0000 mg | INTRAMUSCULAR | Status: DC | PRN

## 2023-05-26 MED ORDER — HEPARIN SODIUM (PORCINE) 5000 UNIT/ML IJ SOLN
5000.0000 [IU] | Freq: Three times a day (TID) | INTRAMUSCULAR | Status: DC
  Administered 2023-05-26 – 2023-05-29 (×8): 5000 [IU] via SUBCUTANEOUS
  Filled 2023-05-26 (×8): qty 1

## 2023-05-26 MED ORDER — ACETAMINOPHEN 325 MG PO TABS
650.0000 mg | ORAL_TABLET | Freq: Four times a day (QID) | ORAL | Status: DC | PRN
  Administered 2023-05-27: 650 mg via ORAL
  Filled 2023-05-26: qty 2

## 2023-05-26 MED ORDER — MIDODRINE HCL 5 MG PO TABS
5.0000 mg | ORAL_TABLET | Freq: Three times a day (TID) | ORAL | Status: DC
  Filled 2023-05-26: qty 1

## 2023-05-26 MED ORDER — SODIUM CHLORIDE 0.9 % IV SOLN
2.0000 g | Freq: Once | INTRAVENOUS | Status: AC
  Administered 2023-05-26: 2 g via INTRAVENOUS
  Filled 2023-05-26: qty 20

## 2023-05-26 MED ORDER — PANTOPRAZOLE SODIUM 40 MG PO TBEC
40.0000 mg | DELAYED_RELEASE_TABLET | Freq: Two times a day (BID) | ORAL | Status: DC
  Administered 2023-05-27 – 2023-05-29 (×5): 40 mg via ORAL
  Filled 2023-05-26 (×5): qty 1

## 2023-05-26 MED ORDER — SODIUM CHLORIDE 0.9 % IV BOLUS
1000.0000 mL | Freq: Once | INTRAVENOUS | Status: AC
  Administered 2023-05-26: 1000 mL via INTRAVENOUS

## 2023-05-26 NOTE — ED Provider Notes (Signed)
[facility] Provider Note    Event Date/Time   First MD Initiated Contact with Patient 05/26/23 2048     (approximate)   History   No chief complaint on file.   HPI  Courtney Mack is a 67 y.o. female history of recurrent complicated UTI secondary to nephrostomy tube placed for hydronephrosis and hydroureter presents to the ER for evaluation of left flank pain decreased urine output significant increase in sediment and purple urine in the bag over the past 24 hours.  Was otherwise doing well.  Not currently on any antibiotics.  Had some chills but no measured temperature at home.     Physical Exam   Triage Vital Signs: ED Triage Vitals  Encounter Vitals Group     BP 05/26/23 1751 (!) 120/55     Systolic BP Percentile --      Diastolic BP Percentile --      Pulse Rate 05/26/23 1751 (!) 102     Resp 05/26/23 1751 18     Temp 05/26/23 1751 98.4 F (36.9 C)     Temp Source 05/26/23 1751 Oral     SpO2 05/26/23 1751 99 %     Weight --      Height --      Head Circumference --      Peak Flow --      Pain Score 05/26/23 1754 6     Pain Loc --      Pain Education --      Exclude from Growth Chart --     Most recent vital signs: Vitals:   05/26/23 2055 05/26/23 2100  BP: (!) 148/40   Pulse:  88  Resp:    Temp:    SpO2:  100%     Constitutional: Alert  Eyes: Conjunctivae are normal.  Head: Atraumatic. Nose: No congestion/rhinnorhea. Mouth/Throat: Mucous membranes are moist.   Neck: Painless ROM.  Cardiovascular:   Good peripheral circulation. Respiratory: Normal respiratory effort.  No retractions.  Gastrointestinal: Soft and nontender.  Musculoskeletal:  no deformity Neurologic:  MAE spontaneously. No gross focal neurologic deficits are appreciated.  Skin:  Skin is warm, dry and intact. No rash noted. Psychiatric: Mood and affect are normal. Speech and behavior are normal.    ED Results / Procedures / Treatments   Labs (all  labs ordered are listed, but only abnormal results are displayed) Labs Reviewed  URINALYSIS, ROUTINE W REFLEX MICROSCOPIC - Abnormal; Notable for the following components:      Result Value   Color, Urine BROWN (*)    APPearance TURBID (*)    Protein, ur >=300 (*)    Leukocytes,Ua SMALL (*)    Bacteria, UA MANY (*)    All other components within normal limits  BASIC METABOLIC PANEL - Abnormal; Notable for the following components:   Glucose, Bld 151 (*)    BUN 27 (*)    Creatinine, Ser 1.05 (*)    GFR, Estimated 58 (*)    All other components within normal limits  CBC - Abnormal; Notable for the following components:   RBC 3.75 (*)    Hemoglobin 10.7 (*)    HCT 35.3 (*)    All other components within normal limits  URINE CULTURE     EKG     RADIOLOGY    PROCEDURES:  Critical Care performed: No  Procedures   MEDICATIONS ORDERED IN ED: Medications  cefTRIAXone (ROCEPHIN) 2 g in sodium chloride 0.9 % 100 mL  IVPB (2 g Intravenous New Bag/Given 05/26/23 2112)  albuterol (VENTOLIN HFA) 108 (90 Base) MCG/ACT inhaler 2 puff (has no administration in time range)  dextromethorphan-guaiFENesin (MUCINEX DM) 30-600 MG per 12 hr tablet 1 tablet (has no administration in time range)  ondansetron (ZOFRAN) injection 4 mg (has no administration in time range)  acetaminophen (TYLENOL) tablet 650 mg (has no administration in time range)  hydrALAZINE (APRESOLINE) injection 5 mg (has no administration in time range)     IMPRESSION / MDM / ASSESSMENT AND PLAN / ED COURSE  I reviewed the triage vital signs and the nursing notes.                              Differential diagnosis includes, but is not limited to, UTI, pyelonephritis, stone, sepsis, nephrostomy tube malfunction  Patient presenting to the ER for evaluation of symptoms as described above.  Based on symptoms, risk factors and considered above differential, this presenting complaint could reflect a potentially  life-threatening illness therefore the patient will be placed on continuous pulse oximetry and telemetry for monitoring.  Laboratory evaluation will be sent to evaluate for the above complaints.  Patient is not meeting septic criteria but does have significant bacteria in her urine with purple urine in the bag with significant sedimentation and decreased output from the bag.  Will order IV Rocephin.  Given her history we discussed case in consultation for admission for IV antibiotics.       FINAL CLINICAL IMPRESSION(S) / ED DIAGNOSES   Final diagnoses:  Complicated UTI (urinary tract infection)     Rx / DC Orders   ED Discharge Orders     None        Note:  This document was prepared using Dragon voice recognition software and may include unintentional dictation errors.    Willy Eddy, MD 05/26/23 2123

## 2023-05-26 NOTE — ED Triage Notes (Addendum)
Pt presents to ED for potential kidney infection. Reports that starting yesterday she noticed her urostomy output was turning purple. Has L side urostomy. Reports previous kidney infections, last of which was 2 months ago requiring hospitalization.

## 2023-05-26 NOTE — ED Notes (Signed)
Informed RN Tiffany via chat/ pt has bed assigned (215A)

## 2023-05-26 NOTE — ED Notes (Addendum)
Pt taken to room 8 via w/c; report called to [employer] RN

## 2023-05-26 NOTE — H&P (Signed)
History and Physical    Courtney Mack XLK:[id number] DOB: October 21, 1955 DOA: 05/26/2023  Referring MD/NP/PA:   [facility]P: [facility]   Patient coming from:  The patient is coming from home.     Chief Complaint: Urine color change, left flank pain, decreased urine output  HPI: Courtney Mack is a 67 y.o. female with medical history significant of left-sided hydronephrosis status post nephrostomy tube placement, recurrent UTI and sepsis due to UTI, hypertension, COPD, history of extensive bilateral PE and DVT (s/p of IVC filter, not on AC), dCHF, who presents with urine color change, left flank pain, decreased urine output.  Patient states that her symptoms started yesterday, including left flank pain, purple urine color change and decreased urine output from nephrostomy tube.  Denies fever or chills.  Patient does not have chest pain, cough, SOB.  No nausea, vomiting, abdominal pain.  Patient reports intermittent diarrhea, with several loose stool bowel movement each day.  She is taking Imodium for diarrhea at home.    Of note, pt was recently hospitalized from 9/28 - 10/1 due to malfunctioning left nephrostomy tube.  Nephrostomy tube was changed by IR.  Patient was also treated with Rocephin for complicated UTI and discharged home with oral cephalosporin to complete a total of 2 weeks antibiotics.  Data reviewed independently and ED Course: pt was found to have positive urinalysis (turbid appearance, small amount of leukocyte, many bacteria, WBC> 50), WBC 5.3, renal function close to baseline, temperature normal, blood pressure 148/40, heart rate of 102, 88, RR 18, oxygen saturation 100% on room air.  Patient is admitted to telemetry bed as inpatient.   EKG:  Not done in ED, will get one.   Review of Systems:   General: no fevers, chills, no body weight gain, has fatigue HEENT: no blurry vision, hearing changes or sore throat Respiratory: no dyspnea, coughing,  wheezing CV: no chest pain, no palpitations GI: no nausea, vomiting, abdominal pain, diarrhea, constipation GU: has left flank pain, urine color change Ext: has trace leg edema Neuro: no unilateral weakness, numbness, or tingling, no vision change or hearing loss Skin: no rash, no skin tear. MSK: No muscle spasm, no deformity, no limitation of range of movement in spin Heme: No easy bruising.  Travel history: No recent long distant travel.   Allergy: No Known Allergies  Past Medical History:  Diagnosis Date   Acute bilateral deep vein thrombosis (DVT) of femoral veins (HCC) 01/02/2021   Acute massive pulmonary embolism (HCC) 08/25/2020   Last Assessment & Plan:  Formatting of this note might be different from the original. Post vena caval filter and on xarelto and O2   Anemia    ARF (acute respiratory failure) (HCC)    Cellulitis of left lower leg 06/20/2021   CHF (congestive heart failure) (HCC)    COVID-19    Diabetes mellitus without complication (HCC)    DVT (deep venous thrombosis) (HCC) 09/06/2020   Endometrial cancer (HCC)    GAVE (gastric antral vascular ectasia)    GERD (gastroesophageal reflux disease)    High cholesterol    Hx of blood clots    Hyperlipidemia    Hypertension    IDA (iron deficiency anemia) 09/21/2020   Obesity    Pneumonia 06/11/2022   recovered   Pressure injury of skin 06/21/2021   Pulmonary embolism (HCC)    Sepsis (HCC)    Thrombocytopenia (HCC)    Urinary tract infection 09/13/2021    Past Surgical History:  Procedure Laterality Date   CATARACT EXTRACTION W/PHACO Right 06/04/2022   Procedure: CATARACT EXTRACTION PHACO AND INTRAOCULAR LENS PLACEMENT (IOC) COMPLICATED RIGHT  31.83  02:17.4;  Surgeon: Galen Manila, MD;  Location: [facility];  Service: Ophthalmology;  Laterality: Right;   CATARACT EXTRACTION W/PHACO Left 07/09/2022   Procedure: CATARACT EXTRACTION PHACO AND INTRAOCULAR LENS PLACEMENT (IOC);  Surgeon: Galen Manila, MD;  Location: [facility];  Service: Ophthalmology;  Laterality: Left;  22.75 1:40.3   COLONOSCOPY N/A 06/21/2021   Procedure: COLONOSCOPY;  Surgeon: Toledo, Boykin Nearing, MD;  Location: [facility] END[facility];  Service: Gastroenterology;  Laterality: N/A;   CYSTOSCOPY W/ URETERAL STENT PLACEMENT Left 09/11/2021   Procedure: CYSTOSCOPY WITH RETROGRADE PYELOGRAM/URETERAL STENT PLACEMENT;  Surgeon: Riki Altes, MD;  Location: [facility] OR[facility];  Service: Urology;  Laterality: Left;   CYSTOSCOPY W/ URETERAL STENT PLACEMENT Left 11/17/2021   Procedure: CYSTOSCOPY WITH STENT REPLACEMENT;  Surgeon: Bjorn Pippin, MD;  Location: [facility];  Service: Urology;  Laterality: Left;   CYSTOSCOPY WITH FULGERATION  11/17/2021   Procedure: CYSTOSCOPY WITH FULGERATION;  Surgeon: Bjorn Pippin, MD;  Location: [facility];  Service: Urology;;   CYSTOSCOPY WITH STENT PLACEMENT Left 10/17/2021   Procedure: CYSTOSCOPY WITH STENT PLACEMENT;  Surgeon: Riki Altes, MD;  Location: [facility];  Service: Urology;  Laterality: Left;   CYSTOSCOPY WITH URETEROSCOPY, STONE BASKETRY AND STENT PLACEMENT Left 10/16/2021   Procedure: CYSTOSCOPY WITH URETEROSCOPY  AND STENT REMOVAL;  Surgeon: Riki Altes, MD;  Location: [facility];  Service: Urology;  Laterality: Left;   ENTEROSCOPY N/A 08/17/2021   Procedure: ENTEROSCOPY;  Surgeon: Toney Reil, MD;  Location: [facility];  Service: Gastroenterology;  Laterality: N/A;   ESOPHAGOGASTRODUODENOSCOPY N/A 06/21/2021   Procedure: ESOPHAGOGASTRODUODENOSCOPY (EGD);  Surgeon: Toledo, Boykin Nearing, MD;  Location: [facility];  Service: Gastroenterology;  Laterality: N/A;   ESOPHAGOGASTRODUODENOSCOPY (EGD) WITH PROPOFOL N/A 08/17/2021   Procedure: ESOPHAGOGASTRODUODENOSCOPY (EGD) WITH PROPOFOL;  Surgeon: Toney Reil, MD;  Location: [facility] ENDOSCOPY;  Service: Gastroenterology;  Laterality: N/A;   ESOPHAGOGASTRODUODENOSCOPY (EGD) WITH PROPOFOL N/A 10/14/2021   Procedure:  ESOPHAGOGASTRODUODENOSCOPY (EGD) WITH PROPOFOL;  Surgeon: Midge Minium, MD;  Location: [facility];  Service: Endoscopy;  Laterality: N/A;   ESOPHAGOGASTRODUODENOSCOPY (EGD) WITH PROPOFOL N/A 02/01/2022   Procedure: ESOPHAGOGASTRODUODENOSCOPY (EGD) WITH PROPOFOL;  Surgeon: Toney Reil, MD;  Location: [facility] ENDOSCOPY;  Service: Gastroenterology;  Laterality: N/A;   ESOPHAGOGASTRODUODENOSCOPY (EGD) WITH PROPOFOL N/A 03/15/2022   Procedure: ESOPHAGOGASTRODUODENOSCOPY (EGD) WITH PROPOFOL;  Surgeon: Wyline Mood, MD;  Location: [facility] ENDOSCOPY;  Service: Gastroenterology;  Laterality: N/A;   ESOPHAGOGASTRODUODENOSCOPY (EGD) WITH PROPOFOL N/A 04/18/2022   Procedure: ESOPHAGOGASTRODUODENOSCOPY (EGD) WITH PROPOFOL;  Surgeon: Toney Reil, MD;  Location: [facility] ENDOSCOPY;  Service: Gastroenterology;  Laterality: N/A;   ESOPHAGOGASTRODUODENOSCOPY (EGD) WITH PROPOFOL N/A 08/09/2022   Procedure: ESOPHAGOGASTRODUODENOSCOPY (EGD) WITH PROPOFOL;  Surgeon: Wyline Mood, MD;  Location: [facility] END[facility]COPY;  Service: Gastroenterology;  Laterality: N/A;   GIVENS CAPSULE STUDY N/A 06/22/2021   Procedure: GIVENS CAPSULE STUDY;  Surgeon: Toledo, Boykin Nearing, MD;  Location: [facility] END[facility];  Service: Gastroenterology;  Laterality: N/A;   IR NEPHROSTOMY EXCHANGE LEFT  02/25/2022   IR NEPHROSTOMY EXCHANGE LEFT  03/27/2022   IR NEPHROSTOMY EXCHANGE LEFT  05/22/2022   IR NEPHROSTOMY EXCHANGE LEFT  07/24/2022   IR NEPHROSTOMY EXCHANGE LEFT  09/24/2022   IR NEPHROSTOMY EXCHANGE LEFT  11/21/2022   IR NEPHROSTOMY EXCHANGE LEFT  02/17/2023   IR NEPHROSTOMY EXCHANGE LEFT  04/06/2023   IR NEPHROSTOMY PLACEMENT LEFT  12/27/2021   IVC FILTER INSERTION N/A 08/18/2020   Procedure: IVC FILTER INSERTION;  Surgeon: Renford Dills, MD;  Location: [facility];  Service: Cardiovascular;  Laterality: N/A;   PERIPHERAL VASCULAR THROMBECTOMY Bilateral 01/03/2021   Procedure: PERIPHERAL VASCULAR THROMBECTOMY;  Surgeon: Annice Needy, MD;   Location: [facility];  Service: Cardiovascular;  Laterality: Bilateral;   PULMONARY THROMBECTOMY N/A 08/18/2020   Procedure: PULMONARY THROMBECTOMY;  Surgeon: Renford Dills, MD;  Location: [facility];  Service: Cardiovascular;  Laterality: N/A;   TUBAL LIGATION     VISCERAL ANGIOGRAPHY N/A 07/18/2021   Procedure: VISCERAL ANGIOGRAPHY;  Surgeon: Annice Needy, MD;  Location: [facility];  Service: Cardiovascular;  Laterality: N/A;    Social History:  reports that she has never smoked. She has never used smokeless tobacco. She reports that she does not currently use alcohol. She reports that she does not use drugs.  Family History:  Family History  Problem Relation Age of Onset   Hypertension Mother    Diabetes Mother    Diabetes Father    Hypertension Father    High Cholesterol Father    Congestive Heart Failure Father    Breast cancer Cousin      Prior to Admission medications   Medication Sig Start Date End Date Taking? Authorizing Provider  acetaminophen (TYLENOL) 500 MG tablet Take 500 mg by mouth every 6 (six) hours as needed for mild pain or fever.    [provider]  albuterol (VENTOLIN HFA) 108 (90 Base) MCG/ACT inhaler Inhale 2 puffs into the lungs every 6 (six) hours as needed for wheezing or shortness of breath. 10/26/22   Marrion Coy, MD  Ascorbic Acid (VITAMIN C) 1000 MG tablet Take 1,000 mg by mouth daily.    [provider]  FEROSUL 325 (65 Fe) MG tablet Take 325 mg by mouth daily with breakfast. 08/14/22   [provider]  fluticasone furoate-vilanterol (BREO ELLIPTA) 100-25 MCG/ACT AEPB Inhale 1 puff into the lungs daily.    [provider]  midodrine (PROAMATINE) 5 MG tablet Take 1 tablet (5 mg total) by mouth 3 (three) times daily with meals. Patient not taking: Reported on 04/07/2023 02/12/23   Arnetha Courser, MD  Multiple Vitamin (MULTIVITAMIN WITH MINERALS) TABS tablet Take 1 tablet by mouth daily. 09/12/20    Standley Brooking, MD  pantoprazole (PROTONIX) 40 MG tablet Take 1 tablet (40 mg total) by mouth 2 (two) times daily. 02/12/23 04/13/23  Arnetha Courser, MD  rosuvastatin (CRESTOR) 10 MG tablet Take 1 tablet (10 mg total) by mouth daily. 01/21/23   Delma Freeze, FNP  simethicone (MYLICON) 80 MG chewable tablet Chew 1 tablet (80 mg total) by mouth every 6 (six) hours as needed for flatulence. 04/08/23   Wouk, Wilfred Curtis, MD  triamcinolone cream (KENALOG) 0.1 % Apply 1 Application topically 2 (two) times daily as needed. 12/04/22   [provider]    Physical Exam: Vitals:   05/26/23 1751 05/26/23 2055 05/26/23 2100  BP: (!) 120/55 (!) 148/40   Pulse: (!) 102  88  Resp: 18    Temp: 98.4 F (36.9 C)    TempSrc: Oral    SpO2: 99%  100%   General: Not in acute distress HEENT:       Eyes: PERRL, EOMI, no jaundice       ENT: No discharge from the ears and nose, no pharynx injection, no tonsillar enlargement.  Neck: No JVD, no bruit, no mass felt. Heme: No neck lymph node enlargement. Cardiac: S1/S2, RRR, No murmurs, No gallops or rubs. Respiratory: No rales, wheezing, rhonchi or rubs. GI: Soft, nondistended, nontender, no rebound pain, no organomegaly, BS present. GU: s/p of left nephrostomy tube placement, has positive left CVA tenderness. Has self debris in bag. Ext: Has trace leg edema bilaterally. 1+DP/PT pulse bilaterally. Musculoskeletal: No joint deformities, No joint redness or warmth, no limitation of ROM in spin. Skin: No rashes.  Neuro: Alert, oriented X3, cranial nerves II-XII grossly intact, moves all extremities normally.  Psych: Patient is not psychotic, no suicidal or hemocidal ideation.  Labs on Admission: I have personally reviewed following labs and imaging studies  CBC: Recent Labs  Lab 05/26/23 1804  WBC 5.3  HGB 10.7*  HCT 35.3*  MCV 94.1  PLT 267   Basic Metabolic Panel: Recent Labs  Lab 05/26/23 1804  NA 138  K 3.8  CL 104  CO2 25   GLUCOSE 151*  BUN 27*  CREATININE 1.05*  CALCIUM 9.1   GFR: CrCl cannot be calculated (Unknown ideal weight.). Liver Function Tests: No results for input(s): "AST", "ALT", "ALKPHOS", "BILITOT", "PROT", "ALBUMIN" in the last 168 hours. No results for input(s): "LIPASE", "AMYLASE" in the last 168 hours. No results for input(s): "AMMONIA" in the last 168 hours. Coagulation Profile: No results for input(s): "INR", "PROTIME" in the last 168 hours. Cardiac Enzymes: No results for input(s): "CKTOTAL", "CKMB", "CKMBINDEX", "TROPONINI" in the last 168 hours. BNP (last 3 results) No results for input(s): "PROBNP" in the last 8760 hours. HbA1C: No results for input(s): "HGBA1C" in the last 72 hours. CBG: No results for input(s): "GLUCAP" in the last 168 hours. Lipid Profile: No results for input(s): "CHOL", "HDL", "LDLCALC", "TRIG", "CHOLHDL", "LDLDIRECT" in the last 72 hours. Thyroid Function Tests: No results for input(s): "TSH", "T4TOTAL", "FREET4", "T3FREE", "THYROIDAB" in the last 72 hours. Anemia Panel: No results for input(s): "VITAMINB12", "FOLATE", "FERRITIN", "TIBC", "IRON", "RETICCTPCT" in the last 72 hours. Urine analysis:    Component Value Date/Time   COLORURINE BROWN (A) 05/26/2023 1804   APPEARANCEUR TURBID (A) 05/26/2023 1804   APPEARANCEUR Cloudy (A) 01/03/2022 1443   LABSPEC 1.015 05/26/2023 1804   PHURINE 8.0 05/26/2023 1804   GLUCOSEU NEGATIVE 05/26/2023 1804   HGBUR NEGATIVE 05/26/2023 1804   BILIRUBINUR NEGATIVE 05/26/2023 1804   BILIRUBINUR Negative 01/03/2022 1443   KETONESUR NEGATIVE 05/26/2023 1804   PROTEINUR >=300 (A) 05/26/2023 1804   NITRITE NEGATIVE 05/26/2023 1804   LEUKOCYTESUR SMALL (A) 05/26/2023 1804   Sepsis Labs: @LABRCNTIP (procalcitonin:4,lacticidven:4) )No results found for this or any previous visit (from the past 240 hour(s)).   Radiological Exams on Admission: No results found.    Assessment/Plan Principal Problem:    Complicated UTI (urinary tract infection) Active Problems:   Hydronephrosis of left kidney   Nephrostomy status (HCC)   Chronic diastolic CHF (congestive heart failure) (HCC)   Hypertension   IDA (iron deficiency anemia)   COPD (chronic obstructive pulmonary disease) (HCC)   HLD (hyperlipidemia)   Diarrhea   History of pulmonary embolus (PE)   Obesity (BMI 30-39.9)   Assessment and Plan:  Complicated UTI (urinary tract infection): Patient is not septic.  No fever or leukocytosis.  Has soft blood pressure, but not meets criteria for sepsis.  Blood pressure 90/40.  WBC 5.3, heart rate 102, 88, RR 18, temperature normal. -Admitted to telemetry bed as inpatient -IV Rocephin -Follow-up of urine culture -IV fluid: 1 L normal  saline -Started midodrine 10 mg 3 times daily  Hydronephrosis of left kidney with left nephrostomy status [facility]): Patient has decreased urine output with a lot of debris in bag, indicating tube malfunction. -Consulted Dr. Fredia Sorrow of IR to change the tube -N.p.o. after midnight  Chronic diastolic CHF (congestive heart failure) (HCC): 2D echo on 11/21/2022 showed EF of 60 to 65% with grade 1 diastolic dysfunction.  Patient has trace leg edema, no shortness of breath, CHF seem to be compensated. -Check BNP -Hold off lasix due to softer blood pressure and risk of developing sepsis  Hypertension -Hold off blood pressure medications due to soft blood pressure  IDA (iron deficiency anemia): Hemoglobin stable 10.7 (8.8 on 04/08/2023) -Continue iron supplement  COPD (chronic obstructive pulmonary disease) (HCC): Stable -Bronchodilators and Mucinex  HLD (hyperlipidemia) -Crestor  Diarrhea -Check C. Difficile  History of pulmonary embolus (PE) and DVT: S/p of IVC.  Patient's not taking anticoagulants due to GI bleeding -No acute issues -Observe closely  Obesity (BMI 30-39.9): Body weight 88.5 kg, BMI 39.38 -Encourage losing weight -Exercise and healthy  diet       DVT ppx: SQ Heparin    Code Status: Full code   Family Communication: not done, no family member is at bed side.     Disposition Plan:  Anticipate discharge back to previous environment  Consults called:  none  Admission status and Level of care: Telemetry Medical:     as inpt       Dispo: The patient is from: Home              Anticipated d/c is to: Home              Anticipated d/c date is: 2 days              Patient currently is not medically stable to d/c.    Severity of Illness:  The appropriate patient status for this patient is INPATIENT. Inpatient status is judged to be reasonable and necessary in order to provide the required intensity of service to ensure the patient's safety. The patient's presenting symptoms, physical exam findings, and initial radiographic and laboratory data in the context of their chronic comorbidities is felt to place them at high risk for further clinical deterioration. Furthermore, it is not anticipated that the patient will be medically stable for discharge from the hospital within 2 midnights of admission.   * I certify that at the point of admission it is my clinical judgment that the patient will require inpatient hospital care spanning beyond 2 midnights from the point of admission due to high intensity of service, high risk for further deterioration and high frequency of surveillance required.*       Date of Service 05/26/2023    Lorretta Harp [facility]   If 7PM-7AM, please contact night-coverage [email or web address] 05/26/2023, 10:11 PM

## 2023-05-27 ENCOUNTER — Telehealth: Payer: Self-pay | Admitting: Cardiovascular Disease

## 2023-05-27 ENCOUNTER — Telehealth: Payer: Self-pay

## 2023-05-27 ENCOUNTER — Inpatient Hospital Stay: Payer: Medicare HMO | Admitting: Radiology

## 2023-05-27 ENCOUNTER — Encounter: Payer: Self-pay | Admitting: Student

## 2023-05-27 DIAGNOSIS — N390 Urinary tract infection, site not specified: Secondary | ICD-10-CM | POA: Diagnosis not present

## 2023-05-27 DIAGNOSIS — E66812 Obesity, class 2: Secondary | ICD-10-CM

## 2023-05-27 DIAGNOSIS — D509 Iron deficiency anemia, unspecified: Secondary | ICD-10-CM | POA: Diagnosis not present

## 2023-05-27 DIAGNOSIS — N1330 Unspecified hydronephrosis: Secondary | ICD-10-CM | POA: Diagnosis not present

## 2023-05-27 DIAGNOSIS — Z6839 Body mass index (BMI) 39.0-39.9, adult: Secondary | ICD-10-CM

## 2023-05-27 DIAGNOSIS — I10 Essential (primary) hypertension: Secondary | ICD-10-CM

## 2023-05-27 DIAGNOSIS — Z936 Other artificial openings of urinary tract status: Secondary | ICD-10-CM | POA: Diagnosis not present

## 2023-05-27 HISTORY — PX: IR NEPHROSTOMY EXCHANGE LEFT: IMG6069

## 2023-05-27 LAB — CBC
HCT: 29.4 % — ABNORMAL LOW (ref 36.0–46.0)
Hemoglobin: 9 g/dL — ABNORMAL LOW (ref 12.0–15.0)
MCH: 28.8 pg (ref 26.0–34.0)
MCHC: 30.6 g/dL (ref 30.0–36.0)
MCV: 94.2 fL (ref 80.0–100.0)
Platelets: 243 10*3/uL (ref 150–400)
RBC: 3.12 MIL/uL — ABNORMAL LOW (ref 3.87–5.11)
RDW: 15.3 % (ref 11.5–15.5)
WBC: 4.3 10*3/uL (ref 4.0–10.5)
nRBC: 0 % (ref 0.0–0.2)

## 2023-05-27 LAB — PROTIME-INR
INR: 1.1 (ref 0.8–1.2)
Prothrombin Time: 14.6 s (ref 11.4–15.2)

## 2023-05-27 LAB — APTT: aPTT: 31 s (ref 24–36)

## 2023-05-27 LAB — BASIC METABOLIC PANEL
Anion gap: 9 (ref 5–15)
BUN: 28 mg/dL — ABNORMAL HIGH (ref 8–23)
CO2: 24 mmol/L (ref 22–32)
Calcium: 8.8 mg/dL — ABNORMAL LOW (ref 8.9–10.3)
Chloride: 107 mmol/L (ref 98–111)
Creatinine, Ser: 0.99 mg/dL (ref 0.44–1.00)
GFR, Estimated: >60 mL/min (ref >60)
Glucose, Bld: 123 mg/dL — ABNORMAL HIGH (ref 70–99)
Potassium: 3.7 mmol/L (ref 3.5–5.1)
Sodium: 140 mmol/L (ref 135–145)

## 2023-05-27 MED ORDER — MORPHINE SULFATE (PF) 2 MG/ML IV SOLN
2.0000 mg | INTRAVENOUS | Status: DC | PRN
  Administered 2023-05-27: 2 mg via INTRAVENOUS
  Filled 2023-05-27: qty 1

## 2023-05-27 MED ORDER — SODIUM CHLORIDE 0.9% FLUSH
5.0000 mL | Freq: Three times a day (TID) | INTRAVENOUS | Status: DC
Start: 2023-05-27 — End: 2023-05-29
  Administered 2023-05-27 – 2023-05-29 (×6): 5 mL

## 2023-05-27 MED ORDER — IOPAMIDOL (ISOVUE-300) INJECTION 61%
7.0000 mL | Freq: Once | INTRAVENOUS | Status: AC | PRN
  Administered 2023-05-27: 7 mL

## 2023-05-27 MED ORDER — LIDOCAINE HCL 1 % IJ SOLN
INTRAMUSCULAR | Status: AC
  Filled 2023-05-27: qty 20

## 2023-05-27 NOTE — Telephone Encounter (Signed)
Called pt to confirm appt. Went straight to vm, but vm was not set up. Unable to lvm.

## 2023-05-27 NOTE — Procedures (Signed)
Interventional Radiology Procedure Note  Procedure: Left percutaneous nephrostomy tube exchange under fluoro  Complications: None  Estimated Blood Loss: None  Findings: New 10 Fr PCN advanced under fluoro and formed in renal pelvis. Attached to new gravity bag.  Patient will be given OP appointment for exchange in 8 weeks.  Courtney Mack. Fredia Sorrow, M.D Pager:  [phone]

## 2023-05-27 NOTE — Telephone Encounter (Signed)
Pt missed her appt yesterday because she was in the hospital. She wanted to make you aware. She wil c/b to r/s once she gets out.

## 2023-05-27 NOTE — Plan of Care (Signed)

## 2023-05-27 NOTE — TOC Initial Note (Signed)
Transition of Care [facility]) - Initial/Assessment Note    Patient Details  Name: Courtney Mack MRN: [id number] Date of Birth: 02/20/1956  Transition of Care [facility]) CM/SW Contact:    Chapman Fitch, RN Phone Number: 05/27/2023, 11:00 AM  Clinical Narrative:                  Admitted with Uti currently on IV antibiotics  Caregiver Support: Lives with Daughter DME at Home: Dan Humphreys, WC, BSC, and shower seat Transportation: Daughter provides transportation [facility]: [facility]  Listed as Level 2 mobility, requested MD order PT eval if medically appropriate        Patient Goals and CMS Choice            Expected Discharge Plan and Services                                              Prior Living Arrangements/Services                       Activities of Daily Living   ADL Screening (condition at time of admission) Independently performs ADLs?: Yes (appropriate for developmental age) Is the patient deaf or have difficulty hearing?: No Does the patient have difficulty seeing, even when wearing glasses/contacts?: No Does the patient have difficulty concentrating, remembering, or making decisions?: No  Permission Sought/Granted                  Emotional Assessment              Admission diagnosis:  Complicated UTI (urinary tract infection) [N39.0] Patient Active Problem List   Diagnosis Date Noted   Complicated UTI (urinary tract infection) 05/26/2023   Diarrhea 05/26/2023   Blocked nephrostomy tube with hydronephrosis and hydroureter (HCC) 04/05/2023   Hypertension    Pressure injury of skin 02/11/2023   Severe sepsis with acute organ dysfunction (HCC) 02/10/2023   Obesity (BMI 30-39.9) 11/22/2022   Sepsis due to gram-negative UTI (HCC) 11/21/2022   Acute pyelonephritis 11/21/2022   Dyspnea 11/21/2022   Chest pain 11/21/2022   Hypokalemia 10/25/2022   Chronic obstructive pulmonary disease (COPD) (HCC)  10/24/2022   Iron deficiency anemia 10/24/2022   Symptomatic anemia 08/08/2022   COPD (chronic obstructive pulmonary disease) (HCC) 08/07/2022   Sepsis due to pneumonia (HCC) 06/12/2022   Multifocal pneumonia 06/12/2022   GERD without esophagitis 06/12/2022   Generalized weakness 06/12/2022   Nephrostomy status (HCC) 06/12/2022   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) 04/16/2022   GI bleed 03/14/2022   Acute on chronic anemia 02/24/2022   Hypotension 02/24/2022   Acute cystitis with hematuria    Lymphedema 02/08/2022   Hematuria 12/24/2021   History of pulmonary embolus (PE) 12/24/2021   Chronic radiation cystitis 11/17/2021   Hydronephrosis of left kidney    Gastric antral vascular ectasia    Complicated urinary tract infection 09/13/2021   AKI (acute kidney injury) (HCC) 09/13/2021   Melena 07/18/2021   Acute on chronic blood loss anemia 07/18/2021   HLD (hyperlipidemia) 06/20/2021   Chronic diastolic CHF (congestive heart failure) (HCC) 06/20/2021   Risk factors for obstructive sleep apnea 11/10/2020   IDA (iron deficiency anemia) 09/21/2020   Endometrial cancer (HCC) 09/13/2020   Recurrent deep vein thrombosis (DVT) (HCC) 09/06/2020   Obesity, Class III, BMI 40-49.9 (  morbid obesity) (HCC) 08/23/2020   Dyslipidemia 08/23/2020   PCP:  [facility]:   [id number] [place], [place] - [address] AT [facility] OF [address] [place] [address] [place] [address] Phone: [phone] Fax: [phone]     Social Determinants of Health (SDOH) Social History: SDOH Screenings   Food Insecurity: No Food Insecurity (05/26/2023)  Housing: Low Risk  (05/26/2023)  Transportation Needs: No Transportation Needs (05/26/2023)  Utilities: Not At Risk (05/26/2023)  Depression (PHQ2-9): Low Risk  (04/11/2022)  Tobacco Use: Low Risk  (05/26/2023)   SDOH Interventions:     Readmission Risk  Interventions    05/27/2023   10:59 AM 04/07/2023   10:32 AM 02/12/2023    1:32 PM  Readmission Risk Prevention Plan  Transportation Screening Complete Complete Complete  PCP or Specialist Appt within 3-5 Days  Complete   Social Work Consult for Recovery Care Planning/Counseling Complete    Palliative Care Screening Not Applicable Not Applicable   Medication Review Oceanographer) Complete Complete Complete  PCP or Specialist appointment within 3-5 days of discharge   Complete  HRI or Home Care Consult   Complete  SW Recovery Care/Counseling Consult   Complete  Palliative Care Screening   Not Applicable  Skilled Nursing Facility   Not Applicable

## 2023-05-27 NOTE — Progress Notes (Signed)
Progress Note   Patient: Courtney Mack ZOX:[id number] DOB: [birth year] DOA: 05/26/2023     1 DOS: the patient was seen and examined on 05/27/2023   Brief hospital course: RENIAH HILLMANN is a 67 y.o. female with medical history significant of left-sided hydronephrosis status post nephrostomy tube placement, recurrent UTI and sepsis due to UTI, hypertension, COPD, history of extensive bilateral PE and DVT (s/p of IVC filter, not on [facility]), dCHF, who presents with urine color change, left flank pain, decreased urine output from nephrostomy tube since yesterday. She was recently hospitalized from 9/28 - 10/1 due to malfunctioning left nephrostomy tube.  Nephrostomy tube was changed by IR, sent home on oral antibiotics for 2 weeks.  Patient is admitted to the hospitalist service for further management evaluation of complicated UTI, left nephrostomy tube malfunction.  Assessment and Plan: Complicated UTI (urinary tract infection):  Hydronephrosis of left kidney with left nephrostomy status (HCC):  Decreased urine output with a lot of debris in bag, indicating tube malfunction. Continue IV Rocephin therapy. Prior culture 04/05/23 pan sensitive E.coli. Follow-up of urine culture/ sensitivities. Patient to go for IR exchange of left nephrostomy tube.  Hypertension BP lower side. Hold antihypertensives for now due to soft blood pressure. She is on midodrine 10mg  TID. Monitor BP closely.   IDA (iron deficiency anemia):  Hemoglobin stable 10.7. Continue iron supplement.  Chronic diastolic CHF (congestive heart failure) (HCC):  No exacerbation. Patient takes Lasix as needed.  Hold off lasix due to softer blood pressure and risk of developing sepsis    COPD (chronic obstructive pulmonary disease) (HCC):  Stable, no exacerbation. Bronchodilators and Mucinex.   HLD (hyperlipidemia) Continue Crestor   Diarrhea Check C. Difficile given her antibiotic use a month ago. Her white count is  low.  If negative for c.diff will give imodium PRN.   History of pulmonary embolus (PE) and DVT:  S/p of IVC.  Patient's not taking anticoagulants due to GI bleeding  Obesity class 2 BMI 40.3 Encourage losing weight, Heart healthy diet.  Active Pressure Injury/Wound(s)     Pressure Ulcer  Duration          Pressure Injury 11/20/22 Buttocks Medial;Bilateral Deep Tissue Pressure Injury - Purple or maroon localized area of discolored intact skin or blood-filled blister due to damage of underlying soft tissue from pressure and/or shear. 187 days   Pressure Injury 02/10/23 Sacrum Mid Stage 1 -  Intact skin with non-blanchable redness of a localized area usually over a bony prominence. inside fold of buttocks 105 days          Advised out of bed to chair. Incentive spirometry. Nursing supportive care. Fall, aspiration precautions. DVT prophylaxis   Code Status: Full Code  Subjective: Patient is seen and examined today morning. She is lying in bed.  Her pain is improved. Awaiting IR procedure. Eating fair.  Physical Exam: Vitals:   05/26/23 2339 05/27/23 0500 05/27/23 0508 05/27/23 0806  BP: (!) 110/53  (!) 111/42 (!) 109/52  Pulse: 83  79 81  Resp: 18  18 18   Temp: 98.1 F (36.7 C)  98.4 F (36.9 C) 98.6 F (37 C)  TempSrc: Oral  Oral Oral  SpO2: 100%  100% 97%  Weight: 90.6 kg 90.5 kg    Height: 4\' 11"  (1.499 m)       General - Elderly obese Caucasian female, no apparent distress HEENT - PERRLA, EOMI, atraumatic head, non tender sinuses. Lung - distant breath sounds, basal rales, rhonchi, no  wheezes. Heart - S1, S2 heard, no murmurs, rubs, trace pedal edema. Abdomen - Soft, non tender, left PCN with dark urine noted. Neuro - Alert, awake and oriented x 3, non focal exam. Skin - Warm and dry.  Data Reviewed:      Latest Ref Rng & Units 05/27/2023    4:54 AM 05/26/2023    6:04 PM 04/08/2023    3:52 AM  CBC  WBC 4.0 - 10.5 K/uL 4.3  5.3  3.8   Hemoglobin 12.0 -  15.0 g/dL 9.0  78.2  8.8   Hematocrit 36.0 - 46.0 % 29.4  35.3  29.0   Platelets 150 - 400 K/uL 243  267  208       Latest Ref Rng & Units 05/27/2023    4:54 AM 05/26/2023    6:04 PM 04/08/2023    3:52 AM  BMP  Glucose 70 - 99 mg/dL 956  213  086   BUN 8 - 23 mg/dL 28  27  23    Creatinine 0.44 - 1.00 mg/dL 5.78  4.69  6.29   Sodium 135 - 145 mmol/L 140  138  135   Potassium 3.5 - 5.1 mmol/L 3.7  3.8  3.3   Chloride 98 - 111 mmol/L 107  104  101   CO2 22 - 32 mmol/L 24  25  29    Calcium 8.9 - 10.3 mg/dL 8.8  9.1  8.4    No results found.   Family Communication: Discussed with patient, she understand and agree. All questions answereed.  Disposition: Status is: Inpatient Remains inpatient appropriate because: Nephrostomy exchange, UTI  Planned Discharge Destination: Home     Time spent: 38 minutes  Author: Marcelino Duster, MD 05/27/2023 12:10 PM Secure chat 7am to 7pm For on call review [email or web address].

## 2023-05-28 ENCOUNTER — Encounter: Payer: Medicare HMO | Admitting: Family

## 2023-05-28 DIAGNOSIS — Z936 Other artificial openings of urinary tract status: Secondary | ICD-10-CM | POA: Diagnosis not present

## 2023-05-28 DIAGNOSIS — N1330 Unspecified hydronephrosis: Secondary | ICD-10-CM | POA: Diagnosis not present

## 2023-05-28 DIAGNOSIS — I5032 Chronic diastolic (congestive) heart failure: Secondary | ICD-10-CM | POA: Diagnosis not present

## 2023-05-28 DIAGNOSIS — N390 Urinary tract infection, site not specified: Secondary | ICD-10-CM | POA: Diagnosis not present

## 2023-05-28 LAB — URINE CULTURE

## 2023-05-28 MED ORDER — FUROSEMIDE 10 MG/ML IJ SOLN
20.0000 mg | Freq: Every day | INTRAMUSCULAR | Status: DC
  Administered 2023-05-28 – 2023-05-29 (×2): 20 mg via INTRAVENOUS
  Filled 2023-05-28 (×2): qty 4

## 2023-05-28 NOTE — Plan of Care (Signed)
  Problem: Safety: Goal: Ability to remain free from injury will improve Outcome: Progressing   Problem: Skin Integrity: Goal: Risk for impaired skin integrity will decrease Outcome: Progressing   

## 2023-05-28 NOTE — Progress Notes (Signed)
Progress Note   Patient: Courtney Mack ZOX:[id number] DOB: 1956-02-07 DOA: 05/26/2023     2 DOS: the patient was seen and examined on 05/28/2023   Brief hospital course: Courtney Mack is a 67 y.o. female with medical history significant of left-sided hydronephrosis status post nephrostomy tube placement, recurrent UTI and sepsis due to UTI, hypertension, COPD, history of extensive bilateral PE and DVT (s/p of IVC filter, not on [facility]), dCHF, who presents with urine color change, left flank pain, decreased urine output from nephrostomy tube since yesterday. She was recently hospitalized from 9/28 - 10/1 due to malfunctioning left nephrostomy tube.  Nephrostomy tube was changed by IR, sent home on oral antibiotics for 2 weeks.  Patient is admitted to the hospitalist service for further management evaluation of complicated UTI, left nephrostomy tube malfunction.  11/19: Left percutaneous nephrostomy tube exchange under fluoro by IR 11/20: Started Lasix  Assessment and Plan: Complicated UTI (urinary tract infection):  Hydronephrosis of left kidney with left nephrostomy status (HCC):  Decreased urine output with a lot of debris in bag, indicating tube malfunction. Continue IV Rocephin therapy. Prior culture 04/05/23 pan sensitive E.coli. Urine c/s this time showing mixed organisms S/p IR exchange of left nephrostomy tube on 11/19  Hypertension BP lower side. Hold antihypertensives for now due to soft blood pressure. Continue midodrine 10mg  TID.   IDA (iron deficiency anemia):  Hemoglobin stable  Continue iron supplement.  Chronic diastolic CHF (congestive heart failure) (HCC):  No exacerbation. Start Lasix 20 mg iv daily for now (she takes 10 mg in am and 5 in the evening per patient at home)    COPD (chronic obstructive pulmonary disease) (HCC):  Stable, no exacerbation. Bronchodilators and Mucinex.   HLD (hyperlipidemia) Continue Crestor   Diarrhea resolved imodium  PRN.   History of pulmonary embolus (PE) and DVT:  S/p of IVC.  Patient's not taking anticoagulants due to GI bleeding  Obesity class 2 BMI 40.3 Encourage losing weight, Heart healthy diet.  Active Pressure Injury/Wound(s)     Pressure Ulcer  Duration          Pressure Injury 11/20/22 Buttocks Medial;Bilateral Deep Tissue Pressure Injury - Purple or maroon localized area of discolored intact skin or blood-filled blister due to damage of underlying soft tissue from pressure and/or shear. 187 days   Pressure Injury 02/10/23 Sacrum Mid Stage 1 -  Intact skin with non-blanchable redness of a localized area usually over a bony prominence. inside fold of buttocks 105 days          Advised out of bed to chair. Incentive spirometry. Nursing supportive care. Fall, aspiration precautions. DVT prophylaxis   Code Status: Full Code  Subjective: Questing Lasix to be restarted.  Feeling much better.  Also requesting rollator for home.  Physical Exam: Vitals:   05/28/23 0353 05/28/23 0747 05/28/23 0837 05/28/23 1632  BP: (!) 102/47 (!) 114/46 (!) 125/46 (!) 104/49  Pulse: 68 63 66 (!) 59  Resp: 18 18 16 17   Temp: 98.3 F (36.8 C) 98.1 F (36.7 C) 98.2 F (36.8 C) 98.5 F (36.9 C)  TempSrc: Oral Oral Oral Oral  SpO2: 97% 96% 96% 96%  Weight:      Height:        General - Elderly obese Caucasian female, no apparent distress HEENT - PERRLA, EOMI, atraumatic head, non tender sinuses. Lung - distant breath sounds, basal rales, rhonchi, no wheezes. Heart - S1, S2 heard, no murmurs, rubs, trace pedal edema. Abdomen -  Soft, non tender, benign Neuro - Alert, awake and oriented x 3, non focal exam. Skin - Warm and dry.  Data Reviewed:      Latest Ref Rng & Units 05/27/2023    4:54 AM 05/26/2023    6:04 PM 04/08/2023    3:52 AM  CBC  WBC 4.0 - 10.5 K/uL 4.3  5.3  3.8   Hemoglobin 12.0 - 15.0 g/dL 9.0  57.8  8.8   Hematocrit 36.0 - 46.0 % 29.4  35.3  29.0   Platelets 150 - 400  K/uL 243  267  208       Latest Ref Rng & Units 05/27/2023    4:54 AM 05/26/2023    6:04 PM 04/08/2023    3:52 AM  BMP  Glucose 70 - 99 mg/dL 469  629  528   BUN 8 - 23 mg/dL 28  27  23    Creatinine 0.44 - 1.00 mg/dL 4.13  2.44  0.10   Sodium 135 - 145 mmol/L 140  138  135   Potassium 3.5 - 5.1 mmol/L 3.7  3.8  3.3   Chloride 98 - 111 mmol/L 107  104  101   CO2 22 - 32 mmol/L 24  25  29    Calcium 8.9 - 10.3 mg/dL 8.8  9.1  8.4    IR NEPHROSTOMY EXCHANGE LEFT  Result Date: 05/27/2023 INDICATION: Urinary tract infection and poor drainage from indwelling chronic left percutaneous nephrostomy tube. The tube was last exchanged on 04/06/2023. EXAM: LEFT PERCUTANEOUS NEPHROSTOMY TUBE EXCHANGE UNDER FLUOROSCOPY COMPARISON:  None Available. MEDICATIONS: None ANESTHESIA/SEDATION: None CONTRAST:  7 mL Omnipaque 300-administered into the collecting system(s) FLUOROSCOPY: Radiation Exposure Index (as provided by the fluoroscopic device): 4.5 mGy Kerma COMPLICATIONS: None immediate. PROCEDURE: Informed written consent was obtained from the patient after a thorough discussion of the procedural risks, benefits and alternatives. All questions were addressed. Maximal Sterile Barrier Technique was utilized including caps, mask, sterile gowns, sterile gloves, sterile drape, hand hygiene and skin antiseptic. A timeout was performed prior to the initiation of the procedure. A pre-existing 10 French percutaneous nephrostomy tube was injected with contrast, cut and removed over a guidewire. A new 10 French catheter was advanced over the wire and formed in the left renal pelvis. Final catheter position was confirmed by fluoroscopy after contrast injection. The catheter was attached to a new gravity drainage bag and secured at the skin with an adhesive retention device. IMPRESSION: Exchange of left 10 French percutaneous nephrostomy tube under fluoroscopy. Electronically Signed   By: Irish Lack M.D.   On: 05/27/2023  12:34     Family Communication: Discussed with patient, she understand and agree. All questions answereed.  Disposition: Status is: Inpatient Remains inpatient appropriate because: Nephrostomy exchange, UTI  Planned Discharge Destination: Home tomorrow     Time spent: 35 minutes  Author: Delfino Lovett, MD 05/28/2023 5:36 PM Secure chat 7am to 7pm For on call review [email or web address].

## 2023-05-28 NOTE — Evaluation (Signed)
Physical Therapy Evaluation Patient Details Name: Courtney Mack MRN: [id number] DOB: 1955/07/21 Today's Date: 05/28/2023  History of Present Illness  Pt is 67 y/o admitted 05/26/23 for complicated UTI. PmHx includes: COPD, HTN, and Hx of bilat PE and DVT.  Clinical Impression  Pt received in bed and agreed to PT session. Pt performs bed mobility MinA for BLE management, STS with the use of RW (2wheels) CGA, and amb to [facility] and within room ~29ft CGA. Vc necessary for RW management. Pt's personal briefs were applied during today's session due to severe urinary incontinence. Pt tolerated Tx fair today, however was limited by fatigue. Pt will continue to benefit from skilled PT sessions to improve functional mobility and activity tolerance to maximize safety following D/C.       If plan is discharge home, recommend the following: A little help with walking and/or transfers;A lot of help with bathing/dressing/bathroom;Assist for transportation;Help with stairs or ramp for entrance   Can travel by private vehicle        Equipment Recommendations Rolling walker (2 wheels)  Recommendations for Other Services       Functional Status Assessment Patient has had a recent decline in their functional status and demonstrates the ability to make significant improvements in function in a reasonable and predictable amount of time.     Precautions / Restrictions Precautions Precautions: Fall Restrictions Weight Bearing Restrictions: No      Mobility  Bed Mobility Overal bed mobility: Needs Assistance Bed Mobility: Sit to Supine, Supine to Sit     Supine to sit: HOB elevated, Used rails, Min assist Sit to supine: Used rails, HOB elevated, Min assist   General bed mobility comments: Pt performed bed mobility MinA for BLE management.    Transfers Overall transfer level: Needs assistance Equipment used: Rolling walker (2 wheels) Transfers: Sit to/from Stand Sit to Stand: Contact guard  assist           General transfer comment: Pt performed STS with the use of RW (2wheels) CGA and did not report any s/sx relative to dizziness when standing.    Ambulation/Gait Ambulation/Gait assistance: Contact guard assist Gait Distance (Feet): 10 Feet Assistive device: Rolling walker (2 wheels) Gait Pattern/deviations: Step-through pattern Gait velocity: decreased     General Gait Details: Pt amb in room with the use of RW (2wheels) CGA. VC necessary for RW management.  Stairs            Wheelchair Mobility     Tilt Bed    Modified Rankin (Stroke Patients Only)       Balance Overall balance assessment: Needs assistance Sitting-balance support: Feet supported Sitting balance-Leahy Scale: Good     Standing balance support: Bilateral upper extremity supported, During functional activity, Reliant on assistive device for balance Standing balance-Leahy Scale: Fair                               Pertinent Vitals/Pain Pain Assessment Pain Assessment: No/denies pain    Home Living Family/patient expects to be discharged to:: Private residence Living Arrangements: Children Available Help at Discharge: Family;Available 24 hours/day Type of Home: Apartment Home Access: Stairs to enter Entrance Stairs-Rails: None Entrance Stairs-Number of Steps: 1   Home Layout: One level Home Equipment: Rollator (4 wheels);Cane - single point;BSC/3in1 Additional Comments: sponge bathes at baseline    Prior Function Prior Level of Function : Needs assist       Physical Assist :  ADLs (physical)   ADLs (physical): IADLs;Dressing;Bathing;Grooming Mobility Comments: amb in house with rollator, has manual w/c for community distances ADLs Comments: assist for LB dressing, bathing, and grooming. wears briefs for urinary incontinence     Extremity/Trunk Assessment   Upper Extremity Assessment Upper Extremity Assessment: Overall WFL for tasks assessed    Lower  Extremity Assessment Lower Extremity Assessment: Generalized weakness       Communication   Communication Communication: No apparent difficulties Cueing Techniques: Verbal cues  Cognition Arousal: Alert Behavior During Therapy: WFL for tasks assessed/performed Overall Cognitive Status: Within Functional Limits for tasks assessed                                 General Comments: AOx4. Pt pleasant and willing to participate in PT session.        General Comments      Exercises     Assessment/Plan    PT Assessment Patient needs continued PT services  PT Problem List Decreased activity tolerance;Decreased mobility       PT Treatment Interventions DME instruction;Gait training;Therapeutic exercise    PT Goals (Current goals can be found in the Care Plan section)  Acute Rehab PT Goals Patient Stated Goal: To get better PT Goal Formulation: With patient Time For Goal Achievement: 06/11/23 Potential to Achieve Goals: Good    Frequency Min 1X/week     Co-evaluation               AM-PAC PT "6 Clicks" Mobility  Outcome Measure Help needed turning from your back to your side while in a flat bed without using bedrails?: A Little Help needed moving from lying on your back to sitting on the side of a flat bed without using bedrails?: A Little Help needed moving to and from a bed to a chair (including a wheelchair)?: A Little Help needed standing up from a chair using your arms (e.g., wheelchair or bedside chair)?: A Little Help needed to walk in hospital room?: A Little Help needed climbing 3-5 steps with a railing? : A Lot 6 Click Score: 17    End of Session   Activity Tolerance: Patient tolerated treatment well;Patient limited by fatigue Patient left: in bed;with call bell/phone within reach;with bed alarm set;with family/visitor present Nurse Communication: Mobility status PT Visit Diagnosis: Other abnormalities of gait and mobility  (R26.89);Difficulty in walking, not elsewhere classified (R26.2);Muscle weakness (generalized) (M62.81)    Time: 5621-3086 PT Time Calculation (min) (ACUTE ONLY): 26 min   Charges:   PT Evaluation $PT Eval Low Complexity: 1 Low PT Treatments $Gait Training: 8-22 mins PT General Charges $$ ACUTE PT VISIT: 1 Visit         Ange Puskas Sauvignon Howard SPT, LAT, ATC  Mikaia Janvier Sauvignon-Howard 05/28/2023, 2:49 PM

## 2023-05-28 NOTE — TOC Progression Note (Signed)
Transition of Care [facility]) - Progression Note    Patient Details  Name: Courtney Mack MRN: [id number] Date of Birth: 23-Apr-1956  [facility]) CM/SW Contact  Chapman Fitch, RN Phone Number: 05/28/2023, 12:01 PM  Clinical Narrative:     Therapy recommending home health Met with patient at bedside to discuss.  She declines at this time.  Patient is aware tat should she change her mind prior to discharge to notify TOC, or if she changes her mind after discharge to follow up with PCP  Patient request RW.  Referral made to Jon with [employer].  She states that she received a rollator in 2023, but is unsure if her insurance was billed.  She is aware that if insurance has paid for a walker within the past 5 year she would not be eligible and have to pay out of pocket        Expected Discharge Plan and Services                                               Social Determinants of Health (SDOH) Interventions SDOH Screenings   Food Insecurity: No Food Insecurity (05/26/2023)  Housing: Low Risk  (05/26/2023)  Transportation Needs: No Transportation Needs (05/26/2023)  Utilities: Not At Risk (05/26/2023)  Depression (PHQ2-9): Low Risk  (04/11/2022)  Tobacco Use: Low Risk  (05/26/2023)    Readmission Risk Interventions    05/27/2023   10:59 AM 04/07/2023   10:32 AM 02/12/2023    1:32 PM  Readmission Risk Prevention Plan  Transportation Screening Complete Complete Complete  PCP or Specialist Appt within 3-5 Days  Complete   Social Work Consult for Recovery Care Planning/Counseling Complete    Palliative Care Screening Not Applicable Not Applicable   Medication Review Oceanographer) Complete Complete Complete  PCP or Specialist appointment within 3-5 days of discharge   Complete  HRI or Home Care Consult   Complete  SW Recovery Care/Counseling Consult   Complete  Palliative Care Screening   Not Applicable  Skilled Nursing Facility   Not Applicable

## 2023-05-29 DIAGNOSIS — Z936 Other artificial openings of urinary tract status: Secondary | ICD-10-CM | POA: Diagnosis not present

## 2023-05-29 DIAGNOSIS — I5032 Chronic diastolic (congestive) heart failure: Secondary | ICD-10-CM | POA: Diagnosis not present

## 2023-05-29 DIAGNOSIS — N390 Urinary tract infection, site not specified: Secondary | ICD-10-CM | POA: Diagnosis not present

## 2023-05-29 DIAGNOSIS — N1330 Unspecified hydronephrosis: Secondary | ICD-10-CM | POA: Diagnosis not present

## 2023-05-29 MED ORDER — CEPHALEXIN 500 MG PO CAPS
500.0000 mg | ORAL_CAPSULE | Freq: Three times a day (TID) | ORAL | Status: DC
  Administered 2023-05-29: 500 mg via ORAL
  Filled 2023-05-29: qty 1

## 2023-05-29 MED ORDER — CEPHALEXIN 500 MG PO CAPS: 500.0000 mg | ORAL_CAPSULE | Freq: Three times a day (TID) | ORAL | 0 refills | Status: AC

## 2023-05-29 MED ORDER — INFLUENZA VAC A&B SURF ANT ADJ 0.5 ML IM SUSY
0.5000 mL | PREFILLED_SYRINGE | INTRAMUSCULAR | Status: AC
  Administered 2023-05-29: 0.5 mL via INTRAMUSCULAR
  Filled 2023-05-29 (×2): qty 0.5

## 2023-05-29 MED ORDER — COVID-19 MRNA VAC-TRIS(PFIZER) 30 MCG/0.3ML IM SUSY
0.3000 mL | PREFILLED_SYRINGE | Freq: Once | INTRAMUSCULAR | Status: AC
  Administered 2023-05-29: 0.3 mL via INTRAMUSCULAR
  Filled 2023-05-29 (×2): qty 0.3

## 2023-05-29 NOTE — Discharge Summary (Signed)
Physician Discharge Summary   Patient: Courtney Mack MRN: [id number] DOB: 07-06-1956  Admit date:     05/26/2023  Discharge date: 05/29/23  Discharge Physician: Delfino Lovett   PCP: [facility]   Recommendations at discharge:    F/up with outpt providers as requested  Discharge Diagnoses: Principal Problem:   Complicated UTI (urinary tract infection) Active Problems:   Hydronephrosis of left kidney   Nephrostomy status (HCC)   Chronic diastolic CHF (congestive heart failure) (HCC)   Hypertension   IDA (iron deficiency anemia)   COPD (chronic obstructive pulmonary disease) (HCC)   HLD (hyperlipidemia)   Diarrhea   History of pulmonary embolus (PE)   Obesity (BMI 30-39.9)  Hospital Course: Assessment and Plan:  Courtney Mack is a 67 y.o. female with medical history significant of left-sided hydronephrosis status post nephrostomy tube placement, recurrent UTI and sepsis due to UTI, hypertension, COPD, history of extensive bilateral PE and DVT (s/p of IVC filter, not on AC), dCHF, who presents with urine color change, left flank pain, decreased urine output from nephrostomy tube since yesterday. She was recently hospitalized from 9/28 - 10/1 due to malfunctioning left nephrostomy tube.  Nephrostomy tube was changed by IR, sent home on oral antibiotics for 2 weeks.   Patient is admitted to the hospitalist service for further management evaluation of complicated UTI, left nephrostomy tube malfunction.   11/19: Left percutaneous nephrostomy tube exchange under fluoro by IR 11/20: Started Lasix   Assessment and Plan: Complicated UTI (urinary tract infection):  Hydronephrosis of left kidney with left nephrostomy status (HCC):  Decreased urine output with a lot of debris in bag, indicating tube malfunction. Treated with IV Rocephin while in the Hospital and D/C on PO Abx Prior culture 04/05/23 pan sensitive E.coli. Urine c/s this time showing mixed  organisms S/p IR exchange of left nephrostomy tube on 11/19   Hypertension Controlled.   IDA (iron deficiency anemia):  Hemoglobin stable  Continue iron supplement.   Chronic diastolic CHF (congestive heart failure) (HCC):  No exacerbation. Prn lasix at home    COPD (chronic obstructive pulmonary disease) (HCC):  Stable, no exacerbation.   HLD (hyperlipidemia) Continue Crestor   Diarrhea resolved   History of pulmonary embolus (PE) and DVT:  S/p of IVC.  Patient's not taking anticoagulants due to GI bleeding   Obesity class 2 BMI 40.3 Encourage losing weight, Heart healthy diet.          Consultants: IR Procedures performed: S/p IR exchange of left nephrostomy tube on 11/19  Disposition: Home Diet recommendation:  Discharge Diet Orders (From admission, onward)     Start     Ordered   05/29/23 0000  Diet - low sodium heart healthy        05/29/23 0904           Carb modified diet DISCHARGE MEDICATION: Allergies as of 05/29/2023   No Known Allergies      Medication List     STOP taking these medications    fluticasone furoate-vilanterol 100-25 MCG/ACT Aepb Commonly known as: BREO ELLIPTA       TAKE these medications    acetaminophen 500 MG tablet Commonly known as: TYLENOL Take 500 mg by mouth every 6 (six) hours as needed for mild pain or fever.   albuterol 108 (90 Base) MCG/ACT inhaler Commonly known as: VENTOLIN HFA Inhale 2 puffs into the lungs every 6 (six) hours as needed for wheezing or shortness of breath.  cephALEXin 500 MG capsule Commonly known as: KEFLEX Take 1 capsule (500 mg total) by mouth 3 (three) times daily for 2 days.   FeroSul 325 (65 Fe) MG tablet Generic drug: ferrous sulfate Take 325 mg by mouth daily with breakfast.   furosemide 20 MG tablet Commonly known as: LASIX Take 20 mg by mouth daily as needed.   multivitamin with minerals Tabs tablet Take 1 tablet by mouth daily.   pantoprazole 40 MG  tablet Commonly known as: PROTONIX Take 1 tablet (40 mg total) by mouth 2 (two) times daily.   rosuvastatin 10 MG tablet Commonly known as: CRESTOR Take 1 tablet (10 mg total) by mouth daily.   simethicone 80 MG chewable tablet Commonly known as: MYLICON Chew 1 tablet (80 mg total) by mouth every 6 (six) hours as needed for flatulence.   Symbicort 80-4.5 MCG/ACT inhaler Generic drug: budesonide-formoterol Inhale 1 puff into the lungs 2 (two) times daily.   triamcinolone cream 0.1 % Commonly known as: KENALOG Apply 1 Application topically 2 (two) times daily as needed.   vitamin C 1000 MG tablet Take 1,000 mg by mouth daily.        Follow-up Information     [facility]. Schedule an appointment as soon as possible for a visit on 06/16/2023.   Specialty: General Practice Why: [facility] Discharge F/UP.Go at 10:00am. Contact information: [address] [place] [address] [phone]                Discharge Exam: Filed Weights   05/27/23 0500 05/28/23 0350 05/29/23 0415  Weight: 90.5 kg 90.9 kg 90.8 kg   General - Elderly obese Caucasian female, no apparent distress HEENT - PERRLA, EOMI, atraumatic head, non tender sinuses. Lung - distant breath sounds, basal rales, rhonchi, no wheezes. Heart - S1, S2 heard, no murmurs, rubs, trace pedal edema. Abdomen - Soft, non tender, benign Neuro - Alert, awake and oriented x 3, non focal exam. Skin - Warm and dry.  Condition at discharge: fair  The results of significant diagnostics from this hospitalization (including imaging, microbiology, ancillary and laboratory) are listed below for reference.   Imaging Studies: IR NEPHROSTOMY EXCHANGE LEFT  Result Date: 05/27/2023 INDICATION: Urinary tract infection and poor drainage from indwelling chronic left percutaneous nephrostomy tube. The tube was last exchanged on 04/06/2023. EXAM: LEFT PERCUTANEOUS NEPHROSTOMY TUBE  EXCHANGE UNDER FLUOROSCOPY COMPARISON:  None Available. MEDICATIONS: None ANESTHESIA/SEDATION: None CONTRAST:  7 mL Omnipaque 300-administered into the collecting system(s) FLUOROSCOPY: Radiation Exposure Index (as provided by the fluoroscopic device): 4.5 mGy Kerma COMPLICATIONS: None immediate. PROCEDURE: Informed written consent was obtained from the patient after a thorough discussion of the procedural risks, benefits and alternatives. All questions were addressed. Maximal Sterile Barrier Technique was utilized including caps, mask, sterile gowns, sterile gloves, sterile drape, hand hygiene and skin antiseptic. A timeout was performed prior to the initiation of the procedure. A pre-existing 10 French percutaneous nephrostomy tube was injected with contrast, cut and removed over a guidewire. A new 10 French catheter was advanced over the wire and formed in the left renal pelvis. Final catheter position was confirmed by fluoroscopy after contrast injection. The catheter was attached to a new gravity drainage bag and secured at the skin with an adhesive retention device. IMPRESSION: Exchange of left 10 French percutaneous nephrostomy tube under fluoroscopy. Electronically Signed   By: Irish Lack M.D.   On: 05/27/2023 12:34    Microbiology: Results for orders placed or  performed during the hospital encounter of 05/26/23  Urine Culture     Status: Abnormal   Collection Time: 05/26/23  6:04 PM   Specimen: Urine, Random  Result Value Ref Range Status   Specimen Description   Final    URINE, RANDOM Performed at [facility], [address] [place], [place] [address]    Special Requests   Final    NONE Performed at [facility], [address] [place], [place] [address]    Culture MULTIPLE SPECIES PRESENT, SUGGEST RECOLLECTION (A)  Final   Report Status 05/28/2023 FINAL  Final    Labs: CBC: Recent Labs  Lab 05/26/23 1804 05/27/23 0454  WBC 5.3 4.3  HGB 10.7* 9.0*   HCT 35.3* 29.4*  MCV 94.1 94.2  PLT 267 243   Basic Metabolic Panel: Recent Labs  Lab 05/26/23 1804 05/27/23 0454  NA 138 140  K 3.8 3.7  CL 104 107  CO2 25 24  GLUCOSE 151* 123*  BUN 27* 28*  CREATININE 1.05* 0.99  CALCIUM 9.1 8.8*   Liver Function Tests: No results for input(s): "AST", "ALT", "ALKPHOS", "BILITOT", "PROT", "ALBUMIN" in the last 168 hours. CBG: No results for input(s): "GLUCAP" in the last 168 hours.  Discharge time spent: greater than 30 minutes.  Signed: Delfino Lovett, MD [facility] 05/29/2023

## 2023-05-29 NOTE — TOC Transition Note (Signed)
Transition of Care [facility]) - CM/SW Discharge Note   Patient Details  Name: Courtney Mack MRN: [id number] Date of Birth: 01/26/1956  Transition of Care [facility]) CM/SW Contact:  Chapman Fitch, RN Phone Number: 05/29/2023, 9:43 AM   Clinical Narrative:     Rw was delivered to room yesterday,  Patient declined home health         Patient Goals and CMS Choice      Discharge Placement                         Discharge Plan and Services Additional resources added to the After Visit Summary for                                       Social Determinants of Health (SDOH) Interventions SDOH Screenings   Food Insecurity: No Food Insecurity (05/26/2023)  Housing: Low Risk  (05/26/2023)  Transportation Needs: No Transportation Needs (05/26/2023)  Utilities: Not At Risk (05/26/2023)  Depression (PHQ2-9): Low Risk  (04/11/2022)  Tobacco Use: Low Risk  (05/26/2023)     Readmission Risk Interventions    05/29/2023    9:42 AM 05/27/2023   10:59 AM 04/07/2023   10:32 AM  Readmission Risk Prevention Plan  Transportation Screening  Complete Complete  PCP or Specialist Appt within 3-5 Days   Complete  Social Work Consult for Recovery Care Planning/Counseling  Complete   Palliative Care Screening  Not Applicable Not Applicable  Medication Review [employer])  Complete Complete  HRI or Home Care Consult Patient refused    SW Recovery Care/Counseling Consult Complete    Palliative Care Screening Not Applicable    Skilled Nursing Facility Not Applicable

## 2023-05-29 NOTE — Progress Notes (Signed)
Physical Therapy Treatment Patient Details Name: Courtney Mack MRN: [id number] DOB: 03/16/1956 Today's Date: 05/29/2023   History of Present Illness Courtney Mack is a 67 y.o. female with medical history significant of left-sided hydronephrosis status post nephrostomy tube placement, recurrent UTI and sepsis due to UTI, hypertension, COPD, history of extensive bilateral PE and DVT (s/p of IVC filter, not on [facility]), dCHF, who presents with urine color change, left flank pain, decreased urine output from nephrostomy tube since yesterday. She was recently hospitalized from 9/28 - 10/1 due to malfunctioning left nephrostomy tube.  Nephrostomy tube was changed by IR, sent home on oral antibiotics for 2 weeks.    PT Comments  Pt to be DC later today. She is agreeable to work with PT to continue to further her progression in mobility. Pt performs 2 transfers and 2 walking bouts today, requires ~3 minutes seated recovery between due to elevated dyspnea, HR slightly elevated, SpO2 >97%. Pt requires min-modA for bed mobility and transfers, but reports confidence in DC to home today. She again confirms refusing HHPT services at DC. Pt has family members planning on helping at DC. AMB progress to but limited to ~10ft per bout of slow walking, however balance appears managed well with RW.    If plan is discharge home, recommend the following: A little help with walking and/or transfers;A lot of help with bathing/dressing/bathroom;Assist for transportation;Help with stairs or ramp for entrance   Can travel by private vehicle        Equipment Recommendations  Rolling walker (2 wheels) (already delivered)    Recommendations for Other Services       Precautions / Restrictions Precautions Precautions: Fall Restrictions Weight Bearing Restrictions: No     Mobility  Bed Mobility Overal bed mobility: Needs Assistance Bed Mobility: Supine to Sit, Sit to Supine     Supine to sit: Min assist Sit  to supine: Min assist   General bed mobility comments: maxA for moving toward [facility] scoot    Transfers Overall transfer level: Needs assistance Equipment used: Rolling walker (2 wheels) Transfers: Sit to/from Stand Sit to Stand: Min assist, Mod assist           General transfer comment: Thereasa Parkin applies a downward force through the RW of >100lbs (as directed by patient) while patient pulls self up to standing with BUE    Ambulation/Gait Ambulation/Gait assistance: Contact guard assist Gait Distance (Feet): 50 Feet (twice with seated break between; pt SOB with elevated RR after each walk, SpO2: 99%) Assistive device: Rolling walker (2 wheels) Gait Pattern/deviations: Step-to pattern       General Gait Details: slow, but steady, looked efforted   Stairs             Wheelchair Mobility     Tilt Bed    Modified Rankin (Stroke Patients Only)       Balance                                            Cognition                                                Exercises      General Comments  Pertinent Vitals/Pain Pain Assessment Pain Assessment: No/denies pain    Home Living                          Prior Function            PT Goals (current goals can now be found in the care plan section) Acute Rehab PT Goals Patient Stated Goal: To get better PT Goal Formulation: With patient Time For Goal Achievement: 06/11/23 Potential to Achieve Goals: Good Progress towards PT goals: Progressing toward goals    Frequency    Min 1X/week      PT Plan      Co-evaluation              AM-PAC PT "6 Clicks" Mobility   Outcome Measure  Help needed turning from your back to your side while in a flat bed without using bedrails?: A Little Help needed moving from lying on your back to sitting on the side of a flat bed without using bedrails?: A Little Help needed moving to and from a bed to a chair  (including a wheelchair)?: A Little Help needed standing up from a chair using your arms (e.g., wheelchair or bedside chair)?: A Little Help needed to walk in hospital room?: A Little Help needed climbing 3-5 steps with a railing? : A Lot 6 Click Score: 17    End of Session   Activity Tolerance: Patient tolerated treatment well;Patient limited by fatigue Patient left: in bed;with call bell/phone within reach;with bed alarm set Nurse Communication: Mobility status PT Visit Diagnosis: Other abnormalities of gait and mobility (R26.89);Difficulty in walking, not elsewhere classified (R26.2);Muscle weakness (generalized) (M62.81)     Time: 7253-6644 PT Time Calculation (min) (ACUTE ONLY): 24 min  Charges:    $Therapeutic Exercise: 23-37 mins PT General Charges $$ ACUTE PT VISIT: 1 Visit                    11:57 AM, 05/29/23 Rosamaria Lints, PT, DPT Physical Therapist - [facility]  [phone] (ASCOM)    Yamile Roedl C 05/29/2023, 11:55 AM

## 2023-05-29 NOTE — Care Management Important Message (Signed)
Important Message  Patient Details  Name: Courtney Mack MRN: [id number] Date of Birth: Sep 13, 1955   Important Message Given:  Yes - Medicare IM  Patient is in an isolation room so I reviewed her form by phone [phone]) and she is in agreement with her discharge. I wished her a speedy recovery and thanked her for her time.   Olegario Messier A Achsah Mcquade 05/29/2023, 9:15 AM

## 2023-05-29 NOTE — Plan of Care (Signed)
  Problem: Activity: Goal: Risk for activity intolerance will decrease Outcome: Progressing   Problem: Pain Management: Goal: General experience of comfort will improve Outcome: Progressing

## 2023-06-02 ENCOUNTER — Ambulatory Visit: Admission: RE | Admit: 2023-06-02 | Payer: Medicare HMO | Source: Ambulatory Visit | Admitting: Radiology

## 2023-06-11 ENCOUNTER — Inpatient Hospital Stay: Payer: Medicare HMO | Attending: Oncology

## 2023-06-12 ENCOUNTER — Ambulatory Visit: Payer: Medicare HMO | Admitting: Urology

## 2023-06-16 ENCOUNTER — Other Ambulatory Visit: Payer: Medicare HMO | Attending: Oncology

## 2023-06-18 ENCOUNTER — Observation Stay
Admission: EM | Admit: 2023-06-18 | Discharge: 2023-06-25 | Disposition: A | Discharge status: Skilled Nursing Facility | Payer: Medicare HMO | Attending: Internal Medicine | Admitting: Internal Medicine

## 2023-06-18 ENCOUNTER — Inpatient Hospital Stay: Payer: Medicare HMO | Admitting: Oncology

## 2023-06-18 ENCOUNTER — Emergency Department: Payer: Medicare HMO

## 2023-06-18 ENCOUNTER — Inpatient Hospital Stay: Payer: Medicare HMO

## 2023-06-18 ENCOUNTER — Other Ambulatory Visit: Payer: Self-pay

## 2023-06-18 DIAGNOSIS — I10 Essential (primary) hypertension: Secondary | ICD-10-CM | POA: Diagnosis present

## 2023-06-18 DIAGNOSIS — Z936 Other artificial openings of urinary tract status: Secondary | ICD-10-CM

## 2023-06-18 DIAGNOSIS — J449 Chronic obstructive pulmonary disease, unspecified: Secondary | ICD-10-CM | POA: Diagnosis not present

## 2023-06-18 DIAGNOSIS — R0609 Other forms of dyspnea: Secondary | ICD-10-CM

## 2023-06-18 DIAGNOSIS — Z9889 Other specified postprocedural states: Secondary | ICD-10-CM | POA: Insufficient documentation

## 2023-06-18 DIAGNOSIS — Z6841 Body Mass Index (BMI) 40.0 and over, adult: Secondary | ICD-10-CM | POA: Diagnosis not present

## 2023-06-18 DIAGNOSIS — D500 Iron deficiency anemia secondary to blood loss (chronic): Secondary | ICD-10-CM | POA: Insufficient documentation

## 2023-06-18 DIAGNOSIS — I5032 Chronic diastolic (congestive) heart failure: Secondary | ICD-10-CM | POA: Diagnosis not present

## 2023-06-18 DIAGNOSIS — I110 Hypertensive heart disease with heart failure: Secondary | ICD-10-CM | POA: Insufficient documentation

## 2023-06-18 DIAGNOSIS — R300 Dysuria: Secondary | ICD-10-CM | POA: Diagnosis not present

## 2023-06-18 DIAGNOSIS — Z8616 Personal history of COVID-19: Secondary | ICD-10-CM | POA: Diagnosis not present

## 2023-06-18 DIAGNOSIS — E785 Hyperlipidemia, unspecified: Secondary | ICD-10-CM | POA: Diagnosis present

## 2023-06-18 DIAGNOSIS — Z8542 Personal history of malignant neoplasm of other parts of uterus: Secondary | ICD-10-CM | POA: Insufficient documentation

## 2023-06-18 DIAGNOSIS — E669 Obesity, unspecified: Secondary | ICD-10-CM | POA: Diagnosis not present

## 2023-06-18 DIAGNOSIS — Z86718 Personal history of other venous thrombosis and embolism: Secondary | ICD-10-CM | POA: Diagnosis not present

## 2023-06-18 DIAGNOSIS — E119 Type 2 diabetes mellitus without complications: Secondary | ICD-10-CM | POA: Insufficient documentation

## 2023-06-18 DIAGNOSIS — R0602 Shortness of breath: Principal | ICD-10-CM | POA: Diagnosis present

## 2023-06-18 DIAGNOSIS — D509 Iron deficiency anemia, unspecified: Secondary | ICD-10-CM | POA: Diagnosis present

## 2023-06-18 DIAGNOSIS — Z79899 Other long term (current) drug therapy: Secondary | ICD-10-CM | POA: Insufficient documentation

## 2023-06-18 DIAGNOSIS — E876 Hypokalemia: Secondary | ICD-10-CM | POA: Diagnosis present

## 2023-06-18 DIAGNOSIS — J189 Pneumonia, unspecified organism: Secondary | ICD-10-CM | POA: Diagnosis not present

## 2023-06-18 DIAGNOSIS — I82409 Acute embolism and thrombosis of unspecified deep veins of unspecified lower extremity: Secondary | ICD-10-CM | POA: Diagnosis present

## 2023-06-18 LAB — CBC
HCT: 28.2 % — ABNORMAL LOW (ref 36.0–46.0)
Hemoglobin: 8.6 g/dL — ABNORMAL LOW (ref 12.0–15.0)
MCH: 28.8 pg (ref 26.0–34.0)
MCHC: 30.5 g/dL (ref 30.0–36.0)
MCV: 94.3 fL (ref 80.0–100.0)
Platelets: 211 10*3/uL (ref 150–400)
RBC: 2.99 MIL/uL — ABNORMAL LOW (ref 3.87–5.11)
RDW: 15.9 % — ABNORMAL HIGH (ref 11.5–15.5)
WBC: 4.6 10*3/uL (ref 4.0–10.5)
nRBC: 0 % (ref 0.0–0.2)

## 2023-06-18 LAB — BRAIN NATRIURETIC PEPTIDE: B Natriuretic Peptide: 32.2 pg/mL (ref 0.0–100.0)

## 2023-06-18 LAB — BASIC METABOLIC PANEL
Anion gap: 9 (ref 5–15)
BUN: 28 mg/dL — ABNORMAL HIGH (ref 8–23)
CO2: 24 mmol/L (ref 22–32)
Calcium: 8.5 mg/dL — ABNORMAL LOW (ref 8.9–10.3)
Chloride: 104 mmol/L (ref 98–111)
Creatinine, Ser: 1.13 mg/dL — ABNORMAL HIGH (ref 0.44–1.00)
GFR, Estimated: 53 mL/min — ABNORMAL LOW (ref >60)
Glucose, Bld: 123 mg/dL — ABNORMAL HIGH (ref 70–99)
Potassium: 3.1 mmol/L — ABNORMAL LOW (ref 3.5–5.1)
Sodium: 137 mmol/L (ref 135–145)

## 2023-06-18 LAB — RESP PANEL BY RT-PCR (RSV, FLU A&B, COVID)  RVPGX2
Influenza A by PCR: NEGATIVE
Influenza B by PCR: NEGATIVE
Resp Syncytial Virus by PCR: NEGATIVE
SARS Coronavirus 2 by RT PCR: NEGATIVE

## 2023-06-18 LAB — LACTIC ACID, PLASMA
Lactic Acid, Venous: 0.9 mmol/L (ref 0.5–1.9)
Lactic Acid, Venous: 1 mmol/L (ref 0.5–1.9)

## 2023-06-18 LAB — MAGNESIUM: Magnesium: 2.2 mg/dL (ref 1.7–2.4)

## 2023-06-18 LAB — TROPONIN I (HIGH SENSITIVITY): Troponin I (High Sensitivity): 8 ng/L (ref <18)

## 2023-06-18 LAB — PROCALCITONIN: Procalcitonin: <0.1 ng/mL

## 2023-06-18 MED ORDER — ACETAMINOPHEN 325 MG PO TABS: 650.0000 mg | ORAL_TABLET | Freq: Four times a day (QID) | ORAL | Status: DC | PRN

## 2023-06-18 MED ORDER — POTASSIUM CHLORIDE CRYS ER 20 MEQ PO TBCR
40.0000 meq | EXTENDED_RELEASE_TABLET | Freq: Once | ORAL | Status: AC
  Administered 2023-06-18: 40 meq via ORAL
  Filled 2023-06-18: qty 2

## 2023-06-18 MED ORDER — SODIUM CHLORIDE 0.9 % IV BOLUS
500.0000 mL | Freq: Once | INTRAVENOUS | Status: AC
  Administered 2023-06-18: 500 mL via INTRAVENOUS

## 2023-06-18 MED ORDER — ALBUTEROL SULFATE (2.5 MG/3ML) 0.083% IN NEBU: 2.5000 mg | INHALATION_SOLUTION | RESPIRATORY_TRACT | Status: DC | PRN

## 2023-06-18 MED ORDER — ENOXAPARIN SODIUM 40 MG/0.4ML IJ SOSY
40.0000 mg | PREFILLED_SYRINGE | INTRAMUSCULAR | Status: DC
  Administered 2023-06-19 – 2023-06-25 (×7): 40 mg via SUBCUTANEOUS
  Filled 2023-06-18 (×7): qty 0.4

## 2023-06-18 MED ORDER — ONDANSETRON HCL 4 MG/2ML IJ SOLN: 4.0000 mg | Freq: Three times a day (TID) | INTRAMUSCULAR | Status: DC | PRN

## 2023-06-18 MED ORDER — SODIUM CHLORIDE 0.9 % IV SOLN
1.0000 g | Freq: Once | INTRAVENOUS | Status: AC
  Administered 2023-06-18: 1 g via INTRAVENOUS
  Filled 2023-06-18: qty 10

## 2023-06-18 MED ORDER — IOHEXOL 350 MG/ML SOLN
75.0000 mL | Freq: Once | INTRAVENOUS | Status: AC | PRN
  Administered 2023-06-18: 75 mL via INTRAVENOUS

## 2023-06-18 MED ORDER — DM-GUAIFENESIN ER 30-600 MG PO TB12: 1.0000 | ORAL_TABLET | Freq: Two times a day (BID) | ORAL | Status: DC | PRN

## 2023-06-18 MED ORDER — IPRATROPIUM-ALBUTEROL 0.5-2.5 (3) MG/3ML IN SOLN
3.0000 mL | Freq: Four times a day (QID) | RESPIRATORY_TRACT | Status: DC
Start: 2023-06-18 — End: 2023-06-20
  Administered 2023-06-19 – 2023-06-20 (×3): 3 mL via RESPIRATORY_TRACT
  Filled 2023-06-18 (×3): qty 3

## 2023-06-18 MED ORDER — SODIUM CHLORIDE 0.9 % IV BOLUS
1000.0000 mL | Freq: Once | INTRAVENOUS | Status: AC
  Administered 2023-06-18: 1000 mL via INTRAVENOUS

## 2023-06-18 MED ORDER — SODIUM CHLORIDE 0.9 % IV SOLN
500.0000 mg | Freq: Once | INTRAVENOUS | Status: AC
  Administered 2023-06-18: 500 mg via INTRAVENOUS
  Filled 2023-06-18: qty 5

## 2023-06-18 NOTE — H&P (Signed)
History and Physical    Courtney Mack ZOX:[id number] DOB: 1955/12/04 DOA: 06/18/2023  Referring MD/NP/PA:   PCP: [facility]   Patient coming from:  The patient is coming from home.     Chief Complaint: SOB and dysuria  HPI: Courtney Mack is a 67 y.o. female with medical history significant of  left-sided hydronephrosis status post nephrostomy tube placement, recurrent UTI and sepsis due to UTI, hypertension, COPD, history of extensive bilateral PE and DVT (s/p of IVC filter, not on AC), dCHF, who presents with SOB and dysuria.  Patient was recently hospitalized from 11/18 - 11/21 due to complicated UTI.  Urine culture was positive for pansensitive E. coli. Pt is s/p IR exchange of left nephrostomy tube on 11/19.  Pt states that she has shortness of breath and dry cough today.  No chest pain, fever or chills.  She has dizziness, generalized weakness.  No nausea, vomiting, diarrhea.  She reports dysuria and burning on urination.  No hematuria.  She has mild left-sided abdominal pain.   Data reviewed independently and ED Course: pt was found to have WBC 4.6, BNP 32.2, renal function close to baseline, negative PCR for COVID, flu and RSV.  Temperature normal, soft blood pressure 97/40, which improved to 128/57 after giving 1.5 L normal saline in ED, heart rate 78, RR 21, oxygen saturation 97% on room air.  Patient is placed on TeleMed follow-up patient  Chest x-ray: Left basilar patchy opacity, likely atelectasis. Aspiration or pneumonia can be considered in the appropriate clinical setting.   CTA:  No evidence of pulmonary emboli.   Changes of mild air trapping.   Aortic Atherosclerosis (ICD10-I70.0).   EKG: I have personally reviewed.  Sinus rhythm, QTc 441, low voltage, nonspecific T wave change.   Review of Systems:   General: no fevers, chills, no body weight gain, has fatigue HEENT: no blurry vision, hearing changes or sore  throat Respiratory: has dyspnea, coughing, no wheezing CV: no chest pain, no palpitations GI: no nausea, vomiting, has mild left  abdominal pain, no diarrhea, constipation GU: has dysuria, burning on urination, no increased urinary frequency, hematuria  Ext: no leg edema Neuro: no unilateral weakness, numbness, or tingling, no vision change or hearing loss Skin: no rash, no skin tear. MSK: No muscle spasm, no deformity, no limitation of range of movement in spin Heme: No easy bruising.  Travel history: No recent long distant travel.   Allergy: No Known Allergies  Past Medical History:  Diagnosis Date   Acute bilateral deep vein thrombosis (DVT) of femoral veins (HCC) 01/02/2021   Acute massive pulmonary embolism (HCC) 08/25/2020   Last Assessment & Plan:  Formatting of this note might be different from the original. Post vena caval filter and on xarelto and O2   Anemia    ARF (acute respiratory failure) (HCC)    Cellulitis of left lower leg 06/20/2021   CHF (congestive heart failure) (HCC)    COVID-19    Diabetes mellitus without complication (HCC)    DVT (deep venous thrombosis) (HCC) 09/06/2020   Endometrial cancer (HCC)    GAVE (gastric antral vascular ectasia)    GERD (gastroesophageal reflux disease)    High cholesterol    Hx of blood clots    Hyperlipidemia    Hypertension    IDA (iron deficiency anemia) 09/21/2020   Obesity    Pneumonia 06/11/2022   recovered   Pressure injury of skin 06/21/2021   Pulmonary embolism (HCC)  Sepsis (HCC)    Thrombocytopenia (HCC)    Urinary tract infection 09/13/2021    Past Surgical History:  Procedure Laterality Date   CATARACT EXTRACTION W/PHACO Right 06/04/2022   Procedure: CATARACT EXTRACTION PHACO AND INTRAOCULAR LENS PLACEMENT (IOC) COMPLICATED RIGHT  31.83  02:17.4;  Surgeon: Galen Manila, MD;  Location: [facility];  Service: Ophthalmology;  Laterality: Right;   CATARACT EXTRACTION W/PHACO Left 07/09/2022    Procedure: CATARACT EXTRACTION PHACO AND INTRAOCULAR LENS PLACEMENT (IOC);  Surgeon: Galen Manila, MD;  Location: [facility];  Service: Ophthalmology;  Laterality: Left;  22.75 1:40.3   COLONOSCOPY N/A 06/21/2021   Procedure: COLONOSCOPY;  Surgeon: Toledo, Boykin Nearing, MD;  Location: [facility] ENDOSCOPY;  Service: Gastroenterology;  Laterality: N/A;   CYSTOSCOPY W/ URETERAL STENT PLACEMENT Left 09/11/2021   Procedure: CYSTOSCOPY WITH RETROGRADE PYELOGRAM/URETERAL STENT PLACEMENT;  Surgeon: Riki Altes, MD;  Location: [facility] ORS;  Service: Urology;  Laterality: Left;   CYSTOSCOPY W/ URETERAL STENT PLACEMENT Left 11/17/2021   Procedure: CYSTOSCOPY WITH STENT REPLACEMENT;  Surgeon: Bjorn Pippin, MD;  Location: [facility] ORS;  Service: Urology;  Laterality: Left;   CYSTOSCOPY WITH FULGERATION  11/17/2021   Procedure: CYSTOSCOPY WITH FULGERATION;  Surgeon: Bjorn Pippin, MD;  Location: [facility] OR[facility];  Service: Urology;;   CYSTOSCOPY WITH STENT PLACEMENT Left 10/17/2021   Procedure: CYSTOSCOPY WITH STENT PLACEMENT;  Surgeon: Riki Altes, MD;  Location: [facility] OR[facility];  Service: Urology;  Laterality: Left;   CYSTOSCOPY WITH URETEROSCOPY, STONE BASKETRY AND STENT PLACEMENT Left 10/16/2021   Procedure: CYSTOSCOPY WITH URETEROSCOPY  AND STENT REMOVAL;  Surgeon: Riki Altes, MD;  Location: [facility] OR[facility];  Service: Urology;  Laterality: Left;   ENTEROSCOPY N/A 08/17/2021   Procedure: ENTEROSCOPY;  Surgeon: Toney Reil, MD;  Location: [facility] ENDOSCOPY;  Service: Gastroenterology;  Laterality: N/A;   ESOPHAGOGASTRODUODENOSCOPY N/A 06/21/2021   Procedure: ESOPHAGOGASTRODUODENOSCOPY (EGD);  Surgeon: Toledo, Boykin Nearing, MD;  Location: [facility] ENDOSCOPY;  Service: Gastroenterology;  Laterality: N/A;   ESOPHAGOGASTRODUODENOSCOPY (EGD) WITH PROPOFOL N/A 08/17/2021   Procedure: ESOPHAGOGASTRODUODENOSCOPY (EGD) WITH PROPOFOL;  Surgeon: Toney Reil, MD;  Location: [facility] ENDOSCOPY;  Service: Gastroenterology;  Laterality:  N/A;   ESOPHAGOGASTRODUODENOSCOPY (EGD) WITH PROPOFOL N/A 10/14/2021   Procedure: ESOPHAGOGASTRODUODENOSCOPY (EGD) WITH PROPOFOL;  Surgeon: Midge Minium, MD;  Location: [facility] ENDOSCOPY;  Service: Endoscopy;  Laterality: N/A;   ESOPHAGOGASTRODUODENOSCOPY (EGD) WITH PROPOFOL N/A 02/01/2022   Procedure: ESOPHAGOGASTRODUODENOSCOPY (EGD) WITH PROPOFOL;  Surgeon: Toney Reil, MD;  Location: [facility] END[facility]COPY;  Service: Gastroenterology;  Laterality: N/A;   ESOPHAGOGASTRODUODENOSCOPY (EGD) WITH PROPOFOL N/A 03/15/2022   Procedure: ESOPHAGOGASTRODUODENOSCOPY (EGD) WITH PROPOFOL;  Surgeon: Wyline Mood, MD;  Location: [facility] ENDOSCOPY;  Service: Gastroenterology;  Laterality: N/A;   ESOPHAGOGASTRODUODENOSCOPY (EGD) WITH PROPOFOL N/A 04/18/2022   Procedure: ESOPHAGOGASTRODUODENOSCOPY (EGD) WITH PROPOFOL;  Surgeon: Toney Reil, MD;  Location: [facility] ENDOSCOPY;  Service: Gastroenterology;  Laterality: N/A;   ESOPHAGOGASTRODUODENOSCOPY (EGD) WITH PROPOFOL N/A 08/09/2022   Procedure: ESOPHAGOGASTRODUODENOSCOPY (EGD) WITH PROPOFOL;  Surgeon: Wyline Mood, MD;  Location: [facility] ENDOSCOPY;  Service: Gastroenterology;  Laterality: N/A;   GIVENS CAPSULE STUDY N/A 06/22/2021   Procedure: GIVENS CAPSULE STUDY;  Surgeon: Toledo, Boykin Nearing, MD;  Location: [facility] ENDOSCOPY;  Service: Gastroenterology;  Laterality: N/A;   IR NEPHROSTOMY EXCHANGE LEFT  02/25/2022   IR NEPHROSTOMY EXCHANGE LEFT  03/27/2022   IR NEPHROSTOMY EXCHANGE LEFT  05/22/2022   IR NEPHROSTOMY EXCHANGE LEFT  07/24/2022   IR NEPHROSTOMY EXCHANGE LEFT  09/24/2022   IR NEPHROSTOMY EXCHANGE LEFT  11/21/2022  IR NEPHROSTOMY EXCHANGE LEFT  02/17/2023   IR NEPHROSTOMY EXCHANGE LEFT  04/06/2023   IR NEPHROSTOMY EXCHANGE LEFT  05/27/2023   IR NEPHROSTOMY PLACEMENT LEFT  12/27/2021   IVC FILTER INSERTION N/A 08/18/2020   Procedure: IVC FILTER INSERTION;  Surgeon: Renford Dills, MD;  Location: [facility] INVASIVE CV LAB;  Service: Cardiovascular;  Laterality: N/A;    PERIPHERAL VASCULAR THROMBECTOMY Bilateral 01/03/2021   Procedure: PERIPHERAL VASCULAR THROMBECTOMY;  Surgeon: Annice Needy, MD;  Location: [facility];  Service: Cardiovascular;  Laterality: Bilateral;   PULMONARY THROMBECTOMY N/A 08/18/2020   Procedure: PULMONARY THROMBECTOMY;  Surgeon: Renford Dills, MD;  Location: [facility];  Service: Cardiovascular;  Laterality: N/A;   TUBAL LIGATION     VISCERAL ANGIOGRAPHY N/A 07/18/2021   Procedure: VISCERAL ANGIOGRAPHY;  Surgeon: Annice Needy, MD;  Location: [facility];  Service: Cardiovascular;  Laterality: N/A;    Social History:  reports that she has never smoked. She has never used smokeless tobacco. She reports that she does not currently use alcohol. She reports that she does not use drugs.  Family History:  Family History  Problem Relation Age of Onset   Hypertension Mother    Diabetes Mother    Diabetes Father    Hypertension Father    High Cholesterol Father    Congestive Heart Failure Father    Breast cancer Cousin      Prior to Admission medications   Medication Sig Start Date End Date Taking? Authorizing Provider  acetaminophen (TYLENOL) 500 MG tablet Take 500 mg by mouth every 6 (six) hours as needed for mild pain or fever.    [provider]  albuterol (VENTOLIN HFA) 108 (90 Base) MCG/ACT inhaler Inhale 2 puffs into the lungs every 6 (six) hours as needed for wheezing or shortness of breath. 10/26/22   Marrion Coy, MD  Ascorbic Acid (VITAMIN C) 1000 MG tablet Take 1,000 mg by mouth daily.    [provider]  FEROSUL 325 (65 Fe) MG tablet Take 325 mg by mouth daily with breakfast. 08/14/22   [provider]  furosemide (LASIX) 20 MG tablet Take 20 mg by mouth daily as needed. 04/15/23   [provider]  Multiple Vitamin (MULTIVITAMIN WITH MINERALS) TABS tablet Take 1 tablet by mouth daily. 09/12/20   Standley Brooking, MD  pantoprazole (PROTONIX) 40 MG tablet Take 1  tablet (40 mg total) by mouth 2 (two) times daily. 02/12/23 05/26/23  Arnetha Courser, MD  rosuvastatin (CRESTOR) 10 MG tablet Take 1 tablet (10 mg total) by mouth daily. 01/21/23   Delma Freeze, FNP  simethicone (MYLICON) 80 MG chewable tablet Chew 1 tablet (80 mg total) by mouth every 6 (six) hours as needed for flatulence. 04/08/23   Wouk, Wilfred Curtis, MD  SYMBICORT 80-4.5 MCG/ACT inhaler Inhale 1 puff into the lungs 2 (two) times daily. 04/11/23   [provider]  triamcinolone cream (KENALOG) 0.1 % Apply 1 Application topically 2 (two) times daily as needed. 12/04/22   [provider]    Physical Exam: Vitals:   06/18/23 2145 06/18/23 2200 06/18/23 2215 06/18/23 2252  BP: (!) 117/94 (!) 133/58 (!) 140/65 129/64  Pulse: 72 66 79 68  Resp: (!) 23 17 15 18   Temp:    98.3 F (36.8 C)  TempSrc:    Oral  SpO2: 100% 100% 100% 100%  Weight:      Height:       General: Not  in acute distress HEENT:       Eyes: PERRL, EOMI, no jaundice       ENT: No discharge from the ears and nose, no pharynx injection, no tonsillar enlargement.        Neck: No JVD, no bruit, no mass felt. Heme: No neck lymph node enlargement. Cardiac: S1/S2, RRR, No murmurs, No gallops or rubs. Respiratory: No rales, wheezing, rhonchi or rubs. GI: Soft, nondistended, nontender, no rebound pain, no organomegaly, BS present. GU:  s/p of left nephrostomy tube placement, Has some debris in bag.  Ext: No pitting leg edema bilaterally. 1+DP/PT pulse bilaterally. Musculoskeletal: No joint deformities, No joint redness or warmth, no limitation of ROM in spin. Skin: No rashes.  Neuro: Alert, oriented X3, cranial nerves II-XII grossly intact, moves all extremities normally. Psych: Patient is not psychotic, no suicidal or hemocidal ideation.  Labs on Admission: I have personally reviewed following labs and imaging studies  CBC: Recent Labs  Lab 06/18/23 1235  WBC 4.6  HGB 8.6*  HCT 28.2*  MCV 94.3  PLT  211   Basic Metabolic Panel: Recent Labs  Lab 06/18/23 1235 06/18/23 1905  NA 137  --   K 3.1*  --   CL 104  --   CO2 24  --   GLUCOSE 123*  --   BUN 28*  --   CREATININE 1.13*  --   CALCIUM 8.5*  --   MG  --  2.2   GFR: Estimated Creatinine Clearance: 45.4 mL/min (A) (by C-G formula based on SCr of 1.13 mg/dL (H)). Liver Function Tests: No results for input(s): "AST", "ALT", "ALKPHOS", "BILITOT", "PROT", "ALBUMIN" in the last 168 hours. No results for input(s): "LIPASE", "AMYLASE" in the last 168 hours. No results for input(s): "AMMONIA" in the last 168 hours. Coagulation Profile: No results for input(s): "INR", "PROTIME" in the last 168 hours. Cardiac Enzymes: No results for input(s): "CKTOTAL", "CKMB", "CKMBINDEX", "TROPONINI" in the last 168 hours. BNP (last 3 results) No results for input(s): "PROBNP" in the last 8760 hours. HbA1C: No results for input(s): "HGBA1C" in the last 72 hours. CBG: No results for input(s): "GLUCAP" in the last 168 hours. Lipid Profile: No results for input(s): "CHOL", "HDL", "LDLCALC", "TRIG", "CHOLHDL", "LDLDIRECT" in the last 72 hours. Thyroid Function Tests: No results for input(s): "TSH", "T4TOTAL", "FREET4", "T3FREE", "THYROIDAB" in the last 72 hours. Anemia Panel: No results for input(s): "VITAMINB12", "FOLATE", "FERRITIN", "TIBC", "IRON", "RETICCTPCT" in the last 72 hours. Urine analysis:    Component Value Date/Time   COLORURINE BROWN (A) 05/26/2023 1804   APPEARANCEUR TURBID (A) 05/26/2023 1804   APPEARANCEUR Cloudy (A) 01/03/2022 1443   LABSPEC 1.015 05/26/2023 1804   PHURINE 8.0 05/26/2023 1804   GLUCOSEU NEGATIVE 05/26/2023 1804   HGBUR NEGATIVE 05/26/2023 1804   BILIRUBINUR NEGATIVE 05/26/2023 1804   BILIRUBINUR Negative 01/03/2022 1443   KETONESUR NEGATIVE 05/26/2023 1804   PROTEINUR >=300 (A) 05/26/2023 1804   NITRITE NEGATIVE 05/26/2023 1804   LEUKOCYTESUR SMALL (A) 05/26/2023 1804   Sepsis  Labs: @LABRCNTIP (procalcitonin:4,lacticidven:4) ) Recent Results (from the past 240 hours)  Resp panel by RT-PCR (RSV, Flu A&B, Covid) Anterior Nasal Swab     Status: None   Collection Time: 06/18/23  5:19 PM   Specimen: Anterior Nasal Swab  Result Value Ref Range Status   SARS Coronavirus 2 by RT PCR NEGATIVE NEGATIVE Final    Comment: (NOTE) SARS-CoV-2 target nucleic acids are NOT DETECTED.  The SARS-CoV-2 RNA is generally detectable in upper  respiratory specimens during the acute phase of infection. The lowest concentration of SARS-CoV-2 viral copies this assay can detect is 138 copies/mL. A negative result does not preclude SARS-Cov-2 infection and should not be used as the sole basis for treatment or other patient management decisions. A negative result may occur with  improper specimen collection/handling, submission of specimen other than nasopharyngeal swab, presence of viral mutation(s) within the areas targeted by this assay, and inadequate number of viral copies(<138 copies/mL). A negative result must be combined with clinical observations, patient history, and epidemiological information. The expected result is Negative.  Fact Sheet for Patients:  BloggerCourse.com  Fact Sheet for Healthcare Providers:  SeriousBroker.it  This test is no t yet approved or cleared by the [place] FDA and  has been authorized for detection and/or diagnosis of SARS-CoV-2 by FDA under an Emergency Use Authorization (EUA). This EUA will remain  in effect (meaning this test can be used) for the duration of the COVID-19 declaration under Section 564(b)(1) of the Act, 21 U.S.C.section 360bbb-3(b)(1), unless the authorization is terminated  or revoked sooner.       Influenza A by PCR NEGATIVE NEGATIVE Final   Influenza B by PCR NEGATIVE NEGATIVE Final    Comment: (NOTE) The Xpert Xpress SARS-CoV-2/FLU/RSV plus assay is intended as an  aid in the diagnosis of influenza from Nasopharyngeal swab specimens and should not be used as a sole basis for treatment. Nasal washings and aspirates are unacceptable for Xpert Xpress SARS-CoV-2/FLU/RSV testing.  Fact Sheet for Patients: BloggerCourse.com  Fact Sheet for Healthcare Providers: SeriousBroker.it  This test is not yet approved or cleared by the [place] FDA and has been authorized for detection and/or diagnosis of SARS-CoV-2 by FDA under an Emergency Use Authorization (EUA). This EUA will remain in effect (meaning this test can be used) for the duration of the COVID-19 declaration under Section 564(b)(1) of the Act, 21 U.S.C. section 360bbb-3(b)(1), unless the authorization is terminated or revoked.     Resp Syncytial Virus by PCR NEGATIVE NEGATIVE Final    Comment: (NOTE) Fact Sheet for Patients: BloggerCourse.com  Fact Sheet for Healthcare Providers: SeriousBroker.it  This test is not yet approved or cleared by the [place] FDA and has been authorized for detection and/or diagnosis of SARS-CoV-2 by FDA under an Emergency Use Authorization (EUA). This EUA will remain in effect (meaning this test can be used) for the duration of the COVID-19 declaration under Section 564(b)(1) of the Act, 21 U.S.C. section 360bbb-3(b)(1), unless the authorization is terminated or revoked.  Performed at [facility], [address] [place], [place] [address]      Radiological Exams on Admission: CT Angio Chest PE W/Cm &/Or Wo Cm Result Date: 06/18/2023 CLINICAL DATA:  Increasing shortness of breath EXAM: CT ANGIOGRAPHY CHEST WITH CONTRAST TECHNIQUE: Multidetector CT imaging of the chest was performed using the standard protocol during bolus administration of intravenous contrast. Multiplanar CT image reconstructions and MIPs were obtained to evaluate the  vascular anatomy. RADIATION DOSE REDUCTION: This exam was performed according to the departmental dose-optimization program which includes automated exposure control, adjustment of the mA and/or kV according to patient size and/or use of iterative reconstruction technique. CONTRAST:  75mL OMNIPAQUE IOHEXOL 350 MG/ML SOLN COMPARISON:  X-ray from earlier in the same day. FINDINGS: Cardiovascular: Atherosclerotic calcifications of the aorta are noted. No aneurysmal dilatation is seen. Heart is mildly enlarged in size. Pulmonary artery shows a normal branching pattern without intraluminal filling defect to suggest pulmonary embolism.  No significant coronary calcifications are noted. Mediastinum/Nodes: Thoracic inlet is within normal limits. No hilar or mediastinal adenopathy is noted. The esophagus is within normal limits. Lungs/Pleura: Mosaic attenuation is noted throughout both lungs consistent with air trapping. Minimal basilar atelectasis is seen. No focal confluent infiltrate is noted. Upper Abdomen: Visualized upper abdomen is unremarkable. Musculoskeletal: Degenerative changes of the thoracic spine are seen. Review of the MIP images confirms the above findings. IMPRESSION: No evidence of pulmonary emboli. Changes of mild air trapping. Aortic Atherosclerosis (ICD10-I70.0). Electronically Signed   By: Alcide Clever M.D.   On: 06/18/2023 20:55   DG Chest 2 View Result Date: 06/18/2023 CLINICAL DATA:  Increased shortness of breath EXAM: CHEST - 2 VIEW COMPARISON:  Chest radiograph dated 02/10/2023 FINDINGS: Normal lung volumes. Left basilar patchy opacity. No pleural effusion or pneumothorax. The heart size and mediastinal contours are within normal limits. No acute osseous abnormality. IMPRESSION: Left basilar patchy opacity, likely atelectasis. Aspiration or pneumonia can be considered in the appropriate clinical setting. Electronically Signed   By: Agustin Cree M.D.   On: 06/18/2023 13:47       Assessment/Plan Principal Problem:   SOB (shortness of breath) Active Problems:   COPD (chronic obstructive pulmonary disease) (HCC)   Chronic diastolic CHF (congestive heart failure) (HCC)   Nephrostomy status (HCC)   Hypokalemia   HTN (hypertension)   IDA (iron deficiency anemia)   Dyslipidemia   Obesity (BMI 30-39.9)   Assessment and Plan:   SOB (shortness of breath): Patient complains of shortness of breath.  Etiology is not clear.  Patient does not have wheezing on auscultation, does not seem to have COPD exacerbation.  BNP normal 32.2, no leg edema, does not seem to have CHF exacerbation.  No fever or leukocytosis, procalcitonin< 0.10, clinically low suspicions for pneumonia.  CTA negative for PE.  Patient received 1 dose of Rocephin and azithromycin, will hold off antibiotics now.  He does not have oxygen desaturation.  -Placed on head of bed for observation -Supportive care -Bronchodilators and PRN Mucinex  COPD (chronic obstructive pulmonary disease) (HCC): No wheezing or rhonchi on auscultation. -Bronchodilators as needed Mucinex  Chronic diastolic CHF (congestive heart failure) (HCC): 2D echo: 11/21/2022 showed EF 60 to 65% with grade 1 diastolic dysfunction.  No leg edema or JVD.  BNP normal 32.2.  CHF is compensated. -Hold Lasix  Nephrostomy status (HCC): pt has hx of hydronephrosis of left kidney with left nephrostomy status [facility]): Patient reports dysuria, will need to rule out recurrent UTI. -Patient received 1 dose of Rocephin and azithromycin in ED -Follow-up urinalysis  Hypokalemia: Potassium 3.1 -Repleted potassium -Check a magnesium level  HTN (hypertension): Patient has softer blood pressure 97/40, which improved to 128/57 after giving 1.5 L normal saline bolus -Hold blood pressure medications now  IDA (iron deficiency anemia): Hemoglobin 8.6 (9.0 on 11/1 19/24). -Continue iron supplement  Dyslipidemia -Crestor  Obesity (BMI 30-39.9): Body  weight 86.2 kg, BMI 38.38 -Encourage losing weight, -Exercise and healthy diet      DVT ppx: SQ Lovenox  Code Status: Full code    Family Communication: not done, no family member is at bed side.     Disposition Plan:  Anticipate discharge back to previous environment  Consults called:  none  Admission status and Level of care: Telemetry Medical:   for obs    Dispo: The patient is from: Home              Anticipated d/c is to: Home  Anticipated d/c date is: 1 day              Patient currently is not medically stable to d/c.    Severity of Illness:  The appropriate patient status for this patient is OBSERVATION. Observation status is judged to be reasonable and necessary in order to provide the required intensity of service to ensure the patient's safety. The patient's presenting symptoms, physical exam findings, and initial radiographic and laboratory data in the context of their medical condition is felt to place them at decreased risk for further clinical deterioration. Furthermore, it is anticipated that the patient will be medically stable for discharge from the hospital within 2 midnights of admission.        Date of Service 06/19/2023    Lorretta Harp [facility]   If 7PM-7AM, please contact night-coverage [email or web address] 06/19/2023, 2:15 AM

## 2023-06-18 NOTE — ED Triage Notes (Signed)
Pt to ED [facility] from home for increased shob today. Also reports thinks has another kidney infection, states urine is smelling. Reports overall not feeling well.

## 2023-06-18 NOTE — ED Notes (Signed)
Lab at bedside. Able to collect all needed tubes. Were able to collect 1 set of sepsis cultures but unable to collect full a second set. MD aware.

## 2023-06-18 NOTE — ED Provider Notes (Signed)
[facility] Provider Note    Event Date/Time   First MD Initiated Contact with Patient 06/18/23 1714     (approximate)   History   Shortness of Breath   HPI  Courtney Mack is a 67 y.o. female   Past medical history of VTE with IVC filter not on anticoagulation due to GI bleeding and anemia leading to blood transfusions in the past, history of nephrostomy placement left-sided with recurrent UTIs, here with shortness of breath starting this morning.  This in the setting of 2 to 3 days of nasal congestion dry cough myalgias.    No fevers or chills.  She also states that her urine coming from her left-sided nephrostomy tube is foul-smelling and cloudy, and she has some dysuria and frequency reminiscent of her recurrent urinary tract infections.  External Medical Documents Reviewed: Discharge summary from November 2024 when she was admitted for recurrent UTI and underwent nephrostomy tube exchange      Physical Exam   Triage Vital Signs: ED Triage Vitals  Encounter Vitals Group     BP 06/18/23 1233 (!) 120/53     Systolic BP Percentile --      Diastolic BP Percentile --      Pulse Rate 06/18/23 1233 72     Resp 06/18/23 1233 20     Temp 06/18/23 1233 98.1 F (36.7 C)     Temp Source 06/18/23 1634 Oral     SpO2 06/18/23 1233 99 %     Weight 06/18/23 1233 190 lb (86.2 kg)     Height 06/18/23 1233 4\' 11"  (1.499 m)     Head Circumference --      Peak Flow --      Pain Score 06/18/23 1233 0     Pain Loc --      Pain Education --      Exclude from Growth Chart --     Most recent vital signs: Vitals:   06/18/23 1715 06/18/23 1717  BP: (!) 103/42   Pulse: 74   Resp: 19   Temp:    SpO2: 100% 100%    General: Awake, no distress.  CV:  Good peripheral perfusion.  Resp:  Normal effort.  Abd:  No distention.  Other:  Awake alert comfortable, hypotensive 90s over 40s afebrile.  Lung sounds with some rhonchi left greater than right.  No  hypoxemia or respiratory distress.  Her nephrostomy tube is draining appropriately on the left side with dark yellow appearing urine.  Soft nontender abdomen.   ED Results / Procedures / Treatments   Labs (all labs ordered are listed, but only abnormal results are displayed) Labs Reviewed  CBC - Abnormal; Notable for the following components:      Result Value   RBC 2.99 (*)    Hemoglobin 8.6 (*)    HCT 28.2 (*)    RDW 15.9 (*)    All other components within normal limits  BASIC METABOLIC PANEL - Abnormal; Notable for the following components:   Potassium 3.1 (*)    Glucose, Bld 123 (*)    BUN 28 (*)    Creatinine, Ser 1.13 (*)    Calcium 8.5 (*)    GFR, Estimated 53 (*)    All other components within normal limits  RESP PANEL BY RT-PCR (RSV, FLU A&B, COVID)  [id number]  CULTURE, BLOOD (ROUTINE X 2)  CULTURE, BLOOD (ROUTINE X 2)  URINALYSIS, ROUTINE W REFLEX MICROSCOPIC  BRAIN NATRIURETIC PEPTIDE  LACTIC ACID, PLASMA  LACTIC ACID, PLASMA  TROPONIN I (HIGH SENSITIVITY)     I ordered and reviewed the above labs they are notable for globin is 8.6 not far from baseline from hospitalization records in November, white blood cell count is normal.  EKG  ED ECG REPORT I, Pilar Jarvis, the attending physician, personally viewed and interpreted this ECG.   Date: 06/18/2023  EKG Time: 1240  Rate: 71  Rhythm: sinus  Axis: nl  Intervals:none  ST&T Change: no stemi    RADIOLOGY I independently reviewed and interpreted chest x-ray and I see no obvious focality or pneumothorax I also reviewed radiologist's formal read.   PROCEDURES:  Critical Care performed: Yes, see critical care procedure note(s)  .Critical Care  Performed by: Pilar Jarvis, MD Authorized by: Pilar Jarvis, MD   Critical care provider statement:    Critical care time (minutes):  30   Critical care was time spent personally by me on the following activities:  Development of treatment plan with patient or  surrogate, discussions with consultants, evaluation of patient's response to treatment, examination of patient, ordering and review of laboratory studies, ordering and review of radiographic studies, ordering and performing treatments and interventions, pulse oximetry, re-evaluation of patient's condition and review of old charts    MEDICATIONS ORDERED IN ED: Medications  sodium chloride 0.9 % bolus 1,000 mL (has no administration in time range)  sodium chloride 0.9 % bolus 500 mL (has no administration in time range)  cefTRIAXone (ROCEPHIN) 1 g in sodium chloride 0.9 % 100 mL IVPB (has no administration in time range)  azithromycin (ZITHROMAX) 500 mg in sodium chloride 0.9 % 250 mL IVPB (has no administration in time range)    External physician / consultants:  I spoke with hospital medicine for admission and regarding care plan for this patient.   IMPRESSION / MDM / ASSESSMENT AND PLAN / ED COURSE  I reviewed the triage vital signs and the nursing notes.                                Patient's presentation is most consistent with acute presentation with potential threat to life or bodily function.  Differential diagnosis includes, but is not limited to, sepsis due to UTI, bacterial pneumonia, viral URI, considered PE   The patient is on the cardiac monitor to evaluate for evidence of arrhythmia and/or significant heart rate changes.  MDM:    This patient with concern for sepsis given infectious symptoms of both urinary tract infection and respiratory infection on differential diagnosis.  She has had a mild cough and congestion leading to sudden onset shortness of breath with exertion this morning.  There is evidence of some focal opacity on the left side on chest x-ray and she is hypotensive 90s over 40s so I have started ceftriaxone and azithromycin as well as 30 cc/kg ideal body weight fluid bolus and sepsis labs including blood culture and lactic.  Will check her urine as well as  this may be a potential source for the recurrent urine infections and some dysuria today.  The Rocephin should cover.  She has a history of extensive VTE not on anticoagulation due to recurrent GI bleeding.  Her blood levels are normal today compared to prior hospitalization.  She has no active GI bleeding.  Her acute onset shortness of breath and hypotension I considered PE as a differential, will get a CT angiogram to  assess.  She will call admission.         FINAL CLINICAL IMPRESSION(S) / ED DIAGNOSES   Final diagnoses:  Pneumonia of left lung due to infectious organism, unspecified part of lung  Dysuria  Exertional dyspnea     Rx / DC Orders   ED Discharge Orders     None        Note:  This document was prepared using Dragon voice recognition software and may include unintentional dictation errors.    Pilar Jarvis, MD 06/18/23 1754

## 2023-06-18 NOTE — ED Notes (Signed)
Patient is hard to obtain IV access on or get blood work from. EMS IV in but will not give much blood draw back. Attempted to do sepsis workup but unsuccessful. Put in IV consult and called lab. MD aware. MD will put in IV [place] if possible.

## 2023-06-18 NOTE — ED Triage Notes (Signed)
First nurse note: Pt to ED via [facility] from home. Pt called due to weakness and SOB w/ exertion. Pt reports increased dizziness from sitting to standing. Pt reports baseline dizziness. Pt reports hx of chronic kidney infections from left kidney. Pt has left nephrostomy tube. Pt not currently on antibiotic. Pt with hx GI bleed w/ anemia. Pt given 500cc PTA.   EMS BP 92/68 CO2 26 HR 69 RA 98%  CBG 161 20g LAC

## 2023-06-19 DIAGNOSIS — R0602 Shortness of breath: Secondary | ICD-10-CM | POA: Diagnosis not present

## 2023-06-19 LAB — URINALYSIS, COMPLETE (UACMP) WITH MICROSCOPIC
Bilirubin Urine: NEGATIVE
Glucose, UA: NEGATIVE mg/dL
Hgb urine dipstick: NEGATIVE
Ketones, ur: NEGATIVE mg/dL
Nitrite: NEGATIVE
Protein, ur: NEGATIVE mg/dL
Specific Gravity, Urine: 1.04 — ABNORMAL HIGH (ref 1.005–1.030)
WBC, UA: >50 WBC/hpf (ref 0–5)
pH: 5 (ref 5.0–8.0)

## 2023-06-19 LAB — CBC
HCT: 25.9 % — ABNORMAL LOW (ref 36.0–46.0)
Hemoglobin: 7.9 g/dL — ABNORMAL LOW (ref 12.0–15.0)
MCH: 28.3 pg (ref 26.0–34.0)
MCHC: 30.5 g/dL (ref 30.0–36.0)
MCV: 92.8 fL (ref 80.0–100.0)
Platelets: 221 10*3/uL (ref 150–400)
RBC: 2.79 MIL/uL — ABNORMAL LOW (ref 3.87–5.11)
RDW: 15.8 % — ABNORMAL HIGH (ref 11.5–15.5)
WBC: 4.1 10*3/uL (ref 4.0–10.5)
nRBC: 0 % (ref 0.0–0.2)

## 2023-06-19 LAB — BASIC METABOLIC PANEL
Anion gap: 6 (ref 5–15)
BUN: 23 mg/dL (ref 8–23)
CO2: 24 mmol/L (ref 22–32)
Calcium: 8.3 mg/dL — ABNORMAL LOW (ref 8.9–10.3)
Chloride: 110 mmol/L (ref 98–111)
Creatinine, Ser: 0.68 mg/dL (ref 0.44–1.00)
GFR, Estimated: >60 mL/min (ref >60)
Glucose, Bld: 94 mg/dL (ref 70–99)
Potassium: 3.5 mmol/L (ref 3.5–5.1)
Sodium: 140 mmol/L (ref 135–145)

## 2023-06-19 MED ORDER — CEFDINIR 300 MG PO CAPS
600.0000 mg | ORAL_CAPSULE | Freq: Every day | ORAL | Status: AC
  Administered 2023-06-20: 600 mg via ORAL
  Filled 2023-06-19: qty 2

## 2023-06-19 MED ORDER — SODIUM CHLORIDE 0.9 % IV SOLN
1.0000 g | INTRAVENOUS | Status: AC
Start: 2023-06-19 — End: 2023-06-22
  Administered 2023-06-19 – 2023-06-22 (×4): 1 g via INTRAVENOUS
  Filled 2023-06-19 (×4): qty 10

## 2023-06-19 MED ORDER — VITAMIN C 500 MG PO TABS
1000.0000 mg | ORAL_TABLET | Freq: Every day | ORAL | Status: DC
  Administered 2023-06-19 – 2023-06-25 (×7): 1000 mg via ORAL
  Filled 2023-06-19 (×7): qty 2

## 2023-06-19 MED ORDER — ROSUVASTATIN CALCIUM 10 MG PO TABS
10.0000 mg | ORAL_TABLET | Freq: Every day | ORAL | Status: DC
  Administered 2023-06-19 – 2023-06-25 (×7): 10 mg via ORAL
  Filled 2023-06-19 (×7): qty 1

## 2023-06-19 MED ORDER — MOMETASONE FURO-FORMOTEROL FUM 100-5 MCG/ACT IN AERO
2.0000 | INHALATION_SPRAY | Freq: Two times a day (BID) | RESPIRATORY_TRACT | Status: DC
  Administered 2023-06-19 – 2023-06-25 (×12): 2 via RESPIRATORY_TRACT
  Filled 2023-06-19 (×2): qty 8.8

## 2023-06-19 MED ORDER — ADULT MULTIVITAMIN W/MINERALS CH
1.0000 | ORAL_TABLET | Freq: Every day | ORAL | Status: DC
  Administered 2023-06-19 – 2023-06-25 (×7): 1 via ORAL
  Filled 2023-06-19 (×7): qty 1

## 2023-06-19 MED ORDER — PANTOPRAZOLE SODIUM 40 MG PO TBEC
40.0000 mg | DELAYED_RELEASE_TABLET | Freq: Two times a day (BID) | ORAL | Status: DC
  Administered 2023-06-19 – 2023-06-25 (×13): 40 mg via ORAL
  Filled 2023-06-19 (×13): qty 1

## 2023-06-19 MED ORDER — FERROUS SULFATE 325 (65 FE) MG PO TABS
325.0000 mg | ORAL_TABLET | Freq: Every day | ORAL | Status: DC
  Administered 2023-06-19 – 2023-06-25 (×7): 325 mg via ORAL
  Filled 2023-06-19 (×7): qty 1

## 2023-06-19 MED ORDER — FUROSEMIDE 20 MG PO TABS
20.0000 mg | ORAL_TABLET | Freq: Every day | ORAL | Status: DC
Start: 2023-06-19 — End: 2023-06-25
  Administered 2023-06-19 – 2023-06-25 (×7): 20 mg via ORAL
  Filled 2023-06-19 (×7): qty 1

## 2023-06-19 MED ORDER — SIMETHICONE 80 MG PO CHEW: 80.0000 mg | CHEWABLE_TABLET | Freq: Four times a day (QID) | ORAL | Status: DC | PRN

## 2023-06-19 NOTE — Plan of Care (Signed)
  Problem: Education: Goal: Knowledge of General Education information will improve Description: Including pain rating scale, medication(s)/side effects and non-pharmacologic comfort measures Outcome: Progressing   Problem: Clinical Measurements: Goal: Respiratory complications will improve Outcome: Progressing   Problem: Nutrition: Goal: Adequate nutrition will be maintained Outcome: Progressing   

## 2023-06-19 NOTE — Progress Notes (Signed)
Responded to consult for IV. Attempted x1 without success. Assessed both arms with [place]. No appropriate veins found for IV or Midline due to depth and size of veins. Recommend considering medication route change or CVAD.

## 2023-06-19 NOTE — Progress Notes (Addendum)
PROGRESS NOTE    TOYA Mack  ZOX:[id number] DOB: 10-Jan-1956 DOA: 06/18/2023 PCP: [facility]     Brief Narrative:   From admission h and p:  Courtney Mack is a 67 y.o. female with medical history significant of  left-sided hydronephrosis status post nephrostomy tube placement, recurrent UTI and sepsis due to UTI, hypertension, COPD, history of extensive bilateral PE and DVT (s/p of IVC filter, not on AC), dCHF, who presents with SOB and dysuria.   Patient was recently hospitalized from 11/18 - 11/21 due to complicated UTI.  Urine culture was positive for pansensitive E. coli. Pt is s/p IR exchange of left nephrostomy tube on 11/19.  Pt states that she has shortness of breath and dry cough today.  No chest pain, fever or chills.  She has dizziness, generalized weakness.  No nausea, vomiting, diarrhea.  She reports dysuria and burning on urination.  No hematuria.  She has mild left-sided abdominal pain.   Assessment & Plan:   Principal Problem:   SOB (shortness of breath) Active Problems:   COPD (chronic obstructive pulmonary disease) (HCC)   Chronic diastolic CHF (congestive heart failure) (HCC)   Nephrostomy status (HCC)   Hypokalemia   HTN (hypertension)   IDA (iron deficiency anemia)   Dyslipidemia   Obesity (BMI 30-39.9)  # Dyspnea Patient tells me this was a solitary episode of dyspnea when ambulating yesterday, resolved. No cough or fever. CTA neg for acute pathology, covid/flu/rsv neg, bnp wnl, troponin neg. As w/u is negative and symptoms are resolved, will monitor clinically. This was the patient's primary reason for coming in - PT evaluation  # Dysuria # Left nephrostomy tube Concern for uti given frequent UTIs, here complaining of dysuria and suprapubic pain. Also with some flank tenderness but this is chronic. No fever or leukocytosis to strongly suggest systemic infection. Urinalysis has not yet been sent. She grew pan-sensitive  e coli in September, we may need to treat empirically. Had nephrostomy tube exchanged last month, gets this done every 2 months. She has some tenderness left flank but this is chronic, no fever or leukocytosis to suggest pyelo. Spoke w/ radiology, they say the upper third of each kidney is visible on the CTA, so incomplete view, but no signs of pyelo or other infectious complication from what's visible. - have messaged nurse to send 2 UAs, one from clean catch and the other from her left nephrostomy tube. Unfortunately, she has already received ceftriaxone - monitor blood cultures - resume ceftriaxone after cultures obtained  # COPD Does not appear to be exacerbated - home dulera  # HFpEF Appears compensated - resume home lasix  # Debility - PT consult  # Obesity noted    DVT prophylaxis: lovenox Code Status: full Family Communication: daughter [name] updated telephonically 12/12, patient lives with that daughter  Level of care: Telemetry Medical Status is: Observation    Consultants:  none  Procedures: none  Antimicrobials:  S/p azithromycin/ceftriaxone    Subjective: No dyspnea or cough, has some suprapubic pain  Objective: Vitals:   06/19/23 0430 06/19/23 0445 06/19/23 0830 06/19/23 1225  BP: (!) 115/49  137/61 (!) 127/52  Pulse: 72 71 69 71  Resp: (!) 21 (!) 22 13 18   Temp:   97.9 F (36.6 C) (!) 97.5 F (36.4 C)  TempSrc:   Oral Axillary  SpO2: 96% 96% 98% 95%  Weight:      Height:       No  intake or output data in the 24 hours ending 06/19/23 1247 Filed Weights   06/18/23 1233  Weight: 86.2 kg    Examination:  General exam: Appears calm and comfortable  Respiratory system: Clear to auscultation. Respiratory effort normal. Left flank tender Cardiovascular system: S1 & S2 heard, RRR.   Gastrointestinal system: Abdomen is nondistended, soft and nontender. No organomegaly or masses felt. Normal bowel sounds heard. Central nervous system: Alert  and oriented. No focal neurological deficits. Extremities: Symmetric 5 x 5 power. Trace LE edema Skin: No rashes, lesions or ulcers Psychiatry: Judgement and insight appear normal. Mood & affect appropriate.     Data Reviewed: I have personally reviewed following labs and imaging studies  CBC: Recent Labs  Lab 06/18/23 1235 06/19/23 0359  WBC 4.6 4.1  HGB 8.6* 7.9*  HCT 28.2* 25.9*  MCV 94.3 92.8  PLT 211 221   Basic Metabolic Panel: Recent Labs  Lab 06/18/23 1235 06/18/23 1905 06/19/23 0359  NA 137  --  140  K 3.1*  --  3.5  CL 104  --  110  CO2 24  --  24  GLUCOSE 123*  --  94  BUN 28*  --  23  CREATININE 1.13*  --  0.68  CALCIUM 8.5*  --  8.3*  MG  --  2.2  --    GFR: Estimated Creatinine Clearance: 64.2 mL/min (by C-G formula based on SCr of 0.68 mg/dL). Liver Function Tests: No results for input(s): "AST", "ALT", "ALKPHOS", "BILITOT", "PROT", "ALBUMIN" in the last 168 hours. No results for input(s): "LIPASE", "AMYLASE" in the last 168 hours. No results for input(s): "AMMONIA" in the last 168 hours. Coagulation Profile: No results for input(s): "INR", "PROTIME" in the last 168 hours. Cardiac Enzymes: No results for input(s): "CKTOTAL", "CKMB", "CKMBINDEX", "TROPONINI" in the last 168 hours. BNP (last 3 results) No results for input(s): "PROBNP" in the last 8760 hours. HbA1C: No results for input(s): "HGBA1C" in the last 72 hours. CBG: No results for input(s): "GLUCAP" in the last 168 hours. Lipid Profile: No results for input(s): "CHOL", "HDL", "LDLCALC", "TRIG", "CHOLHDL", "LDLDIRECT" in the last 72 hours. Thyroid Function Tests: No results for input(s): "TSH", "T4TOTAL", "FREET4", "T3FREE", "THYROIDAB" in the last 72 hours. Anemia Panel: No results for input(s): "VITAMINB12", "FOLATE", "FERRITIN", "TIBC", "IRON", "RETICCTPCT" in the last 72 hours. Urine analysis:    Component Value Date/Time   COLORURINE BROWN (A) 05/26/2023 1804   APPEARANCEUR  TURBID (A) 05/26/2023 1804   APPEARANCEUR Cloudy (A) 01/03/2022 1443   LABSPEC 1.015 05/26/2023 1804   PHURINE 8.0 05/26/2023 1804   GLUCOSEU NEGATIVE 05/26/2023 1804   HGBUR NEGATIVE 05/26/2023 1804   BILIRUBINUR NEGATIVE 05/26/2023 1804   BILIRUBINUR Negative 01/03/2022 1443   KETONESUR NEGATIVE 05/26/2023 1804   PROTEINUR >=300 (A) 05/26/2023 1804   NITRITE NEGATIVE 05/26/2023 1804   LEUKOCYTESUR SMALL (A) 05/26/2023 1804   Sepsis Labs: @LABRCNTIP (procalcitonin:4,lacticidven:4)  ) Recent Results (from the past 240 hours)  Resp panel by RT-PCR (RSV, Flu A&B, Covid) Anterior Nasal Swab     Status: None   Collection Time: 06/18/23  5:19 PM   Specimen: Anterior Nasal Swab  Result Value Ref Range Status   SARS Coronavirus 2 by RT PCR NEGATIVE NEGATIVE Final    Comment: (NOTE) SARS-CoV-2 target nucleic acids are NOT DETECTED.  The SARS-CoV-2 RNA is generally detectable in upper respiratory specimens during the acute phase of infection. The lowest concentration of SARS-CoV-2 viral copies this assay can detect is 138 copies/mL.  A negative result does not preclude SARS-Cov-2 infection and should not be used as the sole basis for treatment or other patient management decisions. A negative result may occur with  improper specimen collection/handling, submission of specimen other than nasopharyngeal swab, presence of viral mutation(s) within the areas targeted by this assay, and inadequate number of viral copies(<138 copies/mL). A negative result must be combined with clinical observations, patient history, and epidemiological information. The expected result is Negative.  Fact Sheet for Patients:  BloggerCourse.com  Fact Sheet for Healthcare Providers:  SeriousBroker.it  This test is no t yet approved or cleared by the [place] FDA and  has been authorized for detection and/or diagnosis of SARS-CoV-2 by FDA under an  Emergency Use Authorization (EUA). This EUA will remain  in effect (meaning this test can be used) for the duration of the COVID-19 declaration under Section 564(b)(1) of the Act, 21 U.S.C.section 360bbb-3(b)(1), unless the authorization is terminated  or revoked sooner.       Influenza A by PCR NEGATIVE NEGATIVE Final   Influenza B by PCR NEGATIVE NEGATIVE Final    Comment: (NOTE) The Xpert Xpress SARS-CoV-2/FLU/RSV plus assay is intended as an aid in the diagnosis of influenza from Nasopharyngeal swab specimens and should not be used as a sole basis for treatment. Nasal washings and aspirates are unacceptable for Xpert Xpress SARS-CoV-2/FLU/RSV testing.  Fact Sheet for Patients: BloggerCourse.com  Fact Sheet for Healthcare Providers: SeriousBroker.it  This test is not yet approved or cleared by the [place] FDA and has been authorized for detection and/or diagnosis of SARS-CoV-2 by FDA under an Emergency Use Authorization (EUA). This EUA will remain in effect (meaning this test can be used) for the duration of the COVID-19 declaration under Section 564(b)(1) of the Act, 21 U.S.C. section 360bbb-3(b)(1), unless the authorization is terminated or revoked.     Resp Syncytial Virus by PCR NEGATIVE NEGATIVE Final    Comment: (NOTE) Fact Sheet for Patients: BloggerCourse.com  Fact Sheet for Healthcare Providers: SeriousBroker.it  This test is not yet approved or cleared by the [place] FDA and has been authorized for detection and/or diagnosis of SARS-CoV-2 by FDA under an Emergency Use Authorization (EUA). This EUA will remain in effect (meaning this test can be used) for the duration of the COVID-19 declaration under Section 564(b)(1) of the Act, 21 U.S.C. section 360bbb-3(b)(1), unless the authorization is terminated or revoked.  Performed at [facility], [address] [place], [place] [address]   Blood Culture (routine x 2)     Status: None (Preliminary result)   Collection Time: 06/18/23  6:56 PM   Specimen: BLOOD  Result Value Ref Range Status   Specimen Description BLOOD BLOOD RIGHT ARM  Final   Special Requests   Final    BOTTLES DRAWN AEROBIC AND ANAEROBIC Blood Culture adequate volume   Culture   Final    NO GROWTH < 12 HOURS Performed at [facility], [address] [place], [place] [address]    Report Status PENDING  Incomplete  Blood Culture (routine x 2)     Status: None (Preliminary result)   Collection Time: 06/18/23  7:05 PM   Specimen: BLOOD  Result Value Ref Range Status   Specimen Description BLOOD BLOOD LEFT ARM  Final   Special Requests   Final    BOTTLES DRAWN AEROBIC AND ANAEROBIC Blood Culture adequate volume   Culture   Final    NO GROWTH < 12 HOURS Performed at  [name], [address] [place], [place] [address]    Report Status PENDING  Incomplete         Radiology Studies: CT Angio Chest PE W/Cm &/Or Wo Cm Result Date: 06/18/2023 CLINICAL DATA:  Increasing shortness of breath EXAM: CT ANGIOGRAPHY CHEST WITH CONTRAST TECHNIQUE: Multidetector CT imaging of the chest was performed using the standard protocol during bolus administration of intravenous contrast. Multiplanar CT image reconstructions and MIPs were obtained to evaluate the vascular anatomy. RADIATION DOSE REDUCTION: This exam was performed according to the departmental dose-optimization program which includes automated exposure control, adjustment of the mA and/or kV according to patient size and/or use of iterative reconstruction technique. CONTRAST:  75mL OMNIPAQUE IOHEXOL 350 MG/ML SOLN COMPARISON:  X-ray from earlier in the same day. FINDINGS: Cardiovascular: Atherosclerotic calcifications of the aorta are noted. No aneurysmal dilatation is seen. Heart is mildly enlarged in size. Pulmonary artery shows a  normal branching pattern without intraluminal filling defect to suggest pulmonary embolism. No significant coronary calcifications are noted. Mediastinum/Nodes: Thoracic inlet is within normal limits. No hilar or mediastinal adenopathy is noted. The esophagus is within normal limits. Lungs/Pleura: Mosaic attenuation is noted throughout both lungs consistent with air trapping. Minimal basilar atelectasis is seen. No focal confluent infiltrate is noted. Upper Abdomen: Visualized upper abdomen is unremarkable. Musculoskeletal: Degenerative changes of the thoracic spine are seen. Review of the MIP images confirms the above findings. IMPRESSION: No evidence of pulmonary emboli. Changes of mild air trapping. Aortic Atherosclerosis (ICD10-I70.0). Electronically Signed   By: Alcide Clever M.D.   On: 06/18/2023 20:55   DG Chest 2 View Result Date: 06/18/2023 CLINICAL DATA:  Increased shortness of breath EXAM: CHEST - 2 VIEW COMPARISON:  Chest radiograph dated 02/10/2023 FINDINGS: Normal lung volumes. Left basilar patchy opacity. No pleural effusion or pneumothorax. The heart size and mediastinal contours are within normal limits. No acute osseous abnormality. IMPRESSION: Left basilar patchy opacity, likely atelectasis. Aspiration or pneumonia can be considered in the appropriate clinical setting. Electronically Signed   By: Agustin Cree M.D.   On: 06/18/2023 13:47        Scheduled Meds:  vitamin C  1,000 mg Oral Daily   enoxaparin (LOVENOX) injection  40 mg Subcutaneous Q24H   ferrous sulfate  325 mg Oral Q breakfast   ipratropium-albuterol  3 mL Nebulization Q6H   mometasone-formoterol  2 puff Inhalation BID   multivitamin with minerals  1 tablet Oral Daily   pantoprazole  40 mg Oral BID   rosuvastatin  10 mg Oral Daily   Continuous Infusions:   LOS: 0 days     Silvano Bilis, MD [facility]   If 7PM-7AM, please contact night-coverage [email or web address] Password [facility] 06/19/2023, 12:47 PM

## 2023-06-20 DIAGNOSIS — R0602 Shortness of breath: Secondary | ICD-10-CM | POA: Diagnosis not present

## 2023-06-20 LAB — URINE CULTURE

## 2023-06-20 NOTE — Progress Notes (Signed)
PT Cancellation Note  Patient Details Name: Courtney Mack MRN: [id number] DOB: Jun 20, 1956   Cancelled Treatment:    Reason Eval/Treat Not Completed: Other (comment).  PT consult received.  Chart reviewed.  Pt just returning to bed with staff assist upon PT arrival (pt was using [facility]); pt appearing very SOB (staff attending to pt) and unable to participate in therapy at this time.  Will re-attempt PT evaluation at a later date/time.  Hendricks Limes, PT 06/20/23, 2:16 PM

## 2023-06-20 NOTE — Care Management Obs Status (Signed)
MEDICARE OBSERVATION STATUS NOTIFICATION   Patient Details  Name: Courtney Mack MRN: [id number] Date of Birth: [birth year]   Medicare Observation Status Notification Given:  Yes    Marlowe Sax, RN 06/20/2023, 1:09 PM

## 2023-06-20 NOTE — Plan of Care (Signed)
  Problem: Nutrition: Goal: Adequate nutrition will be maintained Outcome: Progressing   Problem: Coping: Goal: Level of anxiety will decrease Outcome: Progressing   Problem: Safety: Goal: Ability to remain free from injury will improve Outcome: Progressing   Problem: Skin Integrity: Goal: Risk for impaired skin integrity will decrease Outcome: Progressing   

## 2023-06-20 NOTE — Progress Notes (Signed)
       CROSS COVER NOTE  NAME: JIAN LEMARR MRN: [id number] DOB : 1956/05/26    Concern as stated by nurse / staff   Message received from nurse via secure chat  S- 67 Y/O female presented to ER with SOB and dysuria. Patient on Rocephin for UTI. First dose to be started today at 8pm and IV cath was found to be out of vein. Has not received PM dose. Unable to obtain IV access, IV team attempted with [place] and was unable as well. IV team nurse stated she does not have a usable vein for a mid-line to be placed. B - Hx of left-sided hydronephrosis status post nephrostomy tube placement, recurrent UTI and sepsis due to UTI, hypertension, COPD, history of extensive bilateral PE and DVT. A - Good urine output via urethra and nephrostomy bag, still complaints of burning and pain with urination.  R - Possibly changed ABT route until we are able to obtain access, good candidate for PICC line per IV team nurse assessment.     Pertinent findings on chart review:   Assessment and  Interventions   Assessment:  Plan: One oral dose 600 mg omnicef ordered Dayteam to determine need for PICC        Donnie Mesa NP [facility] Cross Cover 7pm-7am - check amion for availability Pager [phone]

## 2023-06-20 NOTE — Progress Notes (Addendum)
PROGRESS NOTE    Courtney Mack  AYT:[id number] DOB: 04-30-1956 DOA: 06/18/2023 PCP: [facility]   Brief Narrative:  This  67 y.o. female with medical history significant of left-sided hydronephrosis status post nephrostomy tube placement, recurrent UTI and sepsis due to UTI, hypertension, COPD, history of extensive bilateral PE and DVT (s/p of IVC filter, not on AC), dCHF, who presents with SOB and dysuria.   Patient was recently hospitalized from 11/18 - 11/21 due to complicated UTI.  Urine culture was positive for pansensitive E. coli. Pt is s/p IR exchange of left nephrostomy tube on 11/19.  Pt states that she has shortness of breath and dry cough for one day.  She is admitted for further evaluation.  Assessment & Plan:   Principal Problem:   SOB (shortness of breath) Active Problems:   COPD (chronic obstructive pulmonary disease) (HCC)   Chronic diastolic CHF (congestive heart failure) (HCC)   Nephrostomy status (HCC)   Hypokalemia   HTN (hypertension)   IDA (iron deficiency anemia)   Dyslipidemia   Obesity (BMI 30-39.9)   Shortness of Breath: Patient reports single episode of shortness of breath which has resolved now.  She denies any fever, cough,  chest pain. CTA negative for acute pathology,  Covid / Flu / RSV neg, BNP wnl, troponin neg. As w/u is negative and symptoms are resolved,  Monitor closely. PT/ OT  evaluation   Dysuria: Left nephrostomy tube: Concern for uti given frequent UTIs, here complaining of dysuria and suprapubic pain. Also with some flank tenderness but this is chronic. No fever or leukocytosis to strongly suggest systemic infection. Urinalysis has not yet been sent. She grew pan-sensitive e coli in September, we may need to treat empirically. Had nephrostomy tube exchanged last month, gets this done every 2 months. She has some tenderness left flank but this is chronic, no fever or leukocytosis to suggest pyelonephritis.  Spoke w/ radiology, they say the upper third of each kidney is visible on the CTA, so incomplete view, but no signs of pyelo or other infectious complication from what's visible. - have messaged nurse to send 2 UAs, one from clean catch and the other from her left nephrostomy tube. Unfortunately, she has already received ceftriaxone - Monitor blood cultures - resume ceftriaxone after cultures obtained.  COPD: Does not appear like an having acute exacerbation. Continue home Dulera   HFpEF: Appears compensated Resume with Lasix   Debility / Deconditioning: - PT/ OT consult   Obesity: Diet and Exercise discussed in detail.   DVT prophylaxis: Lovenox Code Status: Full code Family Communication: No family at bed side. Disposition Plan:    Status is: Observation The patient remains OBS appropriate and will d/c before 2 midnights.   Admitted for shortness of breath which is now resolved. She is found to have UTI,  started on IV antibiotics awaiting urine cultures.  PT and OT evaluation for disposition  Consultants:  None  Procedures: None  Antimicrobials: Anti-infectives (From admission, onward)    Start     Dose/Rate Route Frequency Ordered Stop   06/20/23 0045  cefdinir (OMNICEF) capsule 600 mg        600 mg Oral Daily 06/19/23 2351 06/20/23 0104   06/19/23 1915  cefTRIAXone (ROCEPHIN) 1 g in sodium chloride 0.9 % 100 mL IVPB        1 g 200 mL/hr over 30 Minutes Intravenous Every 24 hours 06/19/23 1825     06/18/23 1800  cefTRIAXone (ROCEPHIN)  1 g in sodium chloride 0.9 % 100 mL IVPB        1 g 200 mL/hr over 30 Minutes Intravenous  Once 06/18/23 1748 06/18/23 2040   06/18/23 1800  azithromycin (ZITHROMAX) 500 mg in sodium chloride 0.9 % 250 mL IVPB        500 mg 250 mL/hr over 60 Minutes Intravenous  Once 06/18/23 1748 06/18/23 2210      Subjective: Patient was seen and examined at bedside.  Overnight events noted.   Patient reports feeling better,  She still  reports feeling weak and tired.   She reports significant burning when she urinates.  Objective: Vitals:   06/19/23 1658 06/19/23 2140 06/20/23 0720 06/20/23 0823  BP: (!) 91/39 (!) 97/52  128/84  Pulse: 75 73  79  Resp: 15 18  16   Temp: 98.5 F (36.9 C) 98.2 F (36.8 C)  97.8 F (36.6 C)  TempSrc:  Oral    SpO2: 97% 100% 99% 100%  Weight:      Height:        Intake/Output Summary (Last 24 hours) at 06/20/2023 1109 Last data filed at 06/20/2023 1044 Gross per 24 hour  Intake 480 ml  Output 1500 ml  Net -1020 ml   Filed Weights   06/18/23 1233  Weight: 86.2 kg    Examination:  General exam: Appears calm and comfortable, deconditioned, not in any acute distress. Respiratory system: CTA bilaterally . Respiratory effort normal. RR 15 Cardiovascular system: S1 & S2 heard, RRR. No JVD, murmurs, rubs, gallops or clicks. No pedal edema. Gastrointestinal system: Abdomen is non distended, soft and non tender.Normal bowel sounds heard. Central nervous system: Alert and oriented x 3. No focal neurological deficits. Extremities: Symmetric 5 x 5 power. Skin: No rashes, lesions or ulcers Psychiatry: Judgement and insight appear normal. Mood & affect appropriate.     Data Reviewed: I have personally reviewed following labs and imaging studies  CBC: Recent Labs  Lab 06/18/23 1235 06/19/23 0359  WBC 4.6 4.1  HGB 8.6* 7.9*  HCT 28.2* 25.9*  MCV 94.3 92.8  PLT 211 221   Basic Metabolic Panel: Recent Labs  Lab 06/18/23 1235 06/18/23 1905 06/19/23 0359  NA 137  --  140  K 3.1*  --  3.5  CL 104  --  110  CO2 24  --  24  GLUCOSE 123*  --  94  BUN 28*  --  23  CREATININE 1.13*  --  0.68  CALCIUM 8.5*  --  8.3*  MG  --  2.2  --    GFR: Estimated Creatinine Clearance: 64.2 mL/min (by C-G formula based on SCr of 0.68 mg/dL). Liver Function Tests: No results for input(s): "AST", "ALT", "ALKPHOS", "BILITOT", "PROT", "ALBUMIN" in the last 168 hours. No results for  input(s): "LIPASE", "AMYLASE" in the last 168 hours. No results for input(s): "AMMONIA" in the last 168 hours. Coagulation Profile: No results for input(s): "INR", "PROTIME" in the last 168 hours. Cardiac Enzymes: No results for input(s): "CKTOTAL", "CKMB", "CKMBINDEX", "TROPONINI" in the last 168 hours. BNP (last 3 results) No results for input(s): "PROBNP" in the last 8760 hours. HbA1C: No results for input(s): "HGBA1C" in the last 72 hours. CBG: No results for input(s): "GLUCAP" in the last 168 hours. Lipid Profile: No results for input(s): "CHOL", "HDL", "LDLCALC", "TRIG", "CHOLHDL", "LDLDIRECT" in the last 72 hours. Thyroid Function Tests: No results for input(s): "TSH", "T4TOTAL", "FREET4", "T3FREE", "THYROIDAB" in the last 72 hours. Anemia Panel:  No results for input(s): "VITAMINB12", "FOLATE", "FERRITIN", "TIBC", "IRON", "RETICCTPCT" in the last 72 hours. Sepsis Labs: Recent Labs  Lab 06/18/23 1235 06/18/23 1746 06/18/23 1853  PROCALCITON <0.10  --   --   LATICACIDVEN  --  0.9 1.0    Recent Results (from the past 240 hours)  Resp panel by RT-PCR (RSV, Flu A&B, Covid) Anterior Nasal Swab     Status: None   Collection Time: 06/18/23  5:19 PM   Specimen: Anterior Nasal Swab  Result Value Ref Range Status   SARS Coronavirus 2 by RT PCR NEGATIVE NEGATIVE Final    Comment: (NOTE) SARS-CoV-2 target nucleic acids are NOT DETECTED.  The SARS-CoV-2 RNA is generally detectable in upper respiratory specimens during the acute phase of infection. The lowest concentration of SARS-CoV-2 viral copies this assay can detect is 138 copies/mL. A negative result does not preclude SARS-Cov-2 infection and should not be used as the sole basis for treatment or other patient management decisions. A negative result may occur with  improper specimen collection/handling, submission of specimen other than nasopharyngeal swab, presence of viral mutation(s) within the areas targeted by this  assay, and inadequate number of viral copies(<138 copies/mL). A negative result must be combined with clinical observations, patient history, and epidemiological information. The expected result is Negative.  Fact Sheet for Patients:  BloggerCourse.com  Fact Sheet for Healthcare Providers:  SeriousBroker.it  This test is no t yet approved or cleared by the [place] FDA and  has been authorized for detection and/or diagnosis of SARS-CoV-2 by FDA under an Emergency Use Authorization (EUA). This EUA will remain  in effect (meaning this test can be used) for the duration of the COVID-19 declaration under Section 564(b)(1) of the Act, 21 U.S.C.section 360bbb-3(b)(1), unless the authorization is terminated  or revoked sooner.       Influenza A by PCR NEGATIVE NEGATIVE Final   Influenza B by PCR NEGATIVE NEGATIVE Final    Comment: (NOTE) The Xpert Xpress SARS-CoV-2/FLU/RSV plus assay is intended as an aid in the diagnosis of influenza from Nasopharyngeal swab specimens and should not be used as a sole basis for treatment. Nasal washings and aspirates are unacceptable for Xpert Xpress SARS-CoV-2/FLU/RSV testing.  Fact Sheet for Patients: BloggerCourse.com  Fact Sheet for Healthcare Providers: SeriousBroker.it  This test is not yet approved or cleared by the [place] FDA and has been authorized for detection and/or diagnosis of SARS-CoV-2 by FDA under an Emergency Use Authorization (EUA). This EUA will remain in effect (meaning this test can be used) for the duration of the COVID-19 declaration under Section 564(b)(1) of the Act, 21 U.S.C. section 360bbb-3(b)(1), unless the authorization is terminated or revoked.     Resp Syncytial Virus by PCR NEGATIVE NEGATIVE Final    Comment: (NOTE) Fact Sheet for Patients: BloggerCourse.com  Fact Sheet for  Healthcare Providers: SeriousBroker.it  This test is not yet approved or cleared by the [place] FDA and has been authorized for detection and/or diagnosis of SARS-CoV-2 by FDA under an Emergency Use Authorization (EUA). This EUA will remain in effect (meaning this test can be used) for the duration of the COVID-19 declaration under Section 564(b)(1) of the Act, 21 U.S.C. section 360bbb-3(b)(1), unless the authorization is terminated or revoked.  Performed at [facility], [address] [place], [place] [address]   Blood Culture (routine x 2)     Status: None (Preliminary result)   Collection Time: 06/18/23  6:56 PM   Specimen: BLOOD  Result Value Ref Range Status   Specimen Description BLOOD BLOOD RIGHT ARM  Final   Special Requests   Final    BOTTLES DRAWN AEROBIC AND ANAEROBIC Blood Culture adequate volume   Culture   Final    NO GROWTH 2 DAYS Performed at [facility], [address], [place], [place] [address]    Report Status PENDING  Incomplete  Blood Culture (routine x 2)     Status: None (Preliminary result)   Collection Time: 06/18/23  7:05 PM   Specimen: BLOOD  Result Value Ref Range Status   Specimen Description BLOOD BLOOD LEFT ARM  Final   Special Requests   Final    BOTTLES DRAWN AEROBIC AND ANAEROBIC Blood Culture adequate volume   Culture   Final    NO GROWTH 2 DAYS Performed at [facility], [address], [place], [place] [address]    Report Status PENDING  Incomplete     Radiology Studies: CT Angio Chest PE W/Cm &/Or Wo Cm Result Date: 06/18/2023 CLINICAL DATA:  Increasing shortness of breath EXAM: CT ANGIOGRAPHY CHEST WITH CONTRAST TECHNIQUE: Multidetector CT imaging of the chest was performed using the standard protocol during bolus administration of intravenous contrast. Multiplanar CT image reconstructions and MIPs were obtained to evaluate the vascular anatomy. RADIATION DOSE REDUCTION:  This exam was performed according to the departmental dose-optimization program which includes automated exposure control, adjustment of the mA and/or kV according to patient size and/or use of iterative reconstruction technique. CONTRAST:  75mL OMNIPAQUE IOHEXOL 350 MG/ML SOLN COMPARISON:  X-ray from earlier in the same day. FINDINGS: Cardiovascular: Atherosclerotic calcifications of the aorta are noted. No aneurysmal dilatation is seen. Heart is mildly enlarged in size. Pulmonary artery shows a normal branching pattern without intraluminal filling defect to suggest pulmonary embolism. No significant coronary calcifications are noted. Mediastinum/Nodes: Thoracic inlet is within normal limits. No hilar or mediastinal adenopathy is noted. The esophagus is within normal limits. Lungs/Pleura: Mosaic attenuation is noted throughout both lungs consistent with air trapping. Minimal basilar atelectasis is seen. No focal confluent infiltrate is noted. Upper Abdomen: Visualized upper abdomen is unremarkable. Musculoskeletal: Degenerative changes of the thoracic spine are seen. Review of the MIP images confirms the above findings. IMPRESSION: No evidence of pulmonary emboli. Changes of mild air trapping. Aortic Atherosclerosis (ICD10-I70.0). Electronically Signed   By: Alcide Clever M.D.   On: 06/18/2023 20:55   DG Chest 2 View Result Date: 06/18/2023 CLINICAL DATA:  Increased shortness of breath EXAM: CHEST - 2 VIEW COMPARISON:  Chest radiograph dated 02/10/2023 FINDINGS: Normal lung volumes. Left basilar patchy opacity. No pleural effusion or pneumothorax. The heart size and mediastinal contours are within normal limits. No acute osseous abnormality. IMPRESSION: Left basilar patchy opacity, likely atelectasis. Aspiration or pneumonia can be considered in the appropriate clinical setting. Electronically Signed   By: Agustin Cree M.D.   On: 06/18/2023 13:47    Scheduled Meds:  vitamin C  1,000 mg Oral Daily   enoxaparin  (LOVENOX) injection  40 mg Subcutaneous Q24H   ferrous sulfate  325 mg Oral Q breakfast   furosemide  20 mg Oral Daily   mometasone-formoterol  2 puff Inhalation BID   multivitamin with minerals  1 tablet Oral Daily   pantoprazole  40 mg Oral BID   rosuvastatin  10 mg Oral Daily   Continuous Infusions:  cefTRIAXone (ROCEPHIN)  IV       LOS: 0 days    Time spent: 50 mins  Willeen Niece, MD [facility]   If 7PM-7AM, please contact night-coverage

## 2023-06-20 NOTE — TOC Initial Note (Signed)
Transition of Care [facility]) - Initial/Assessment Note    Patient Details  Name: Courtney Mack MRN: [id number] Date of Birth: Apr 14, 1956  Transition of [facility]) CM/SW Contact:    Marlowe Sax, RN Phone Number: 06/20/2023, 1:05 PM  Clinical Narrative:                   Caregiver Support: Lives with Daughter DME at Home: Dan Humphreys, [facility], [facility]SC, and shower seat Transportation: Daughter provides transportation PCP: [facility] Patient was recently hospitalized from 11/18 - 11/21 due to complicated UTI. Urine culture was positive for pansensitive E. coli. Pt is s/p IR exchange of left nephrostomy tube on 11/19.   At the last visit she refused to have HH And stated that she still does not want it, MOON reviewed and she stated understanding Expected Discharge Plan: Home/Self Care Barriers to Discharge: Continued Medical Work up   Patient Goals and CMS Choice            Expected Discharge Plan and Services   Discharge Planning Services: CM Consult   Living arrangements for the past 2 months: Single Family Home                   DME Agency: NA                  Prior Living Arrangements/Services Living arrangements for the past 2 months: Single Family Home Lives with:: Adult Children Patient language and need for interpreter reviewed:: Yes Do you feel safe going back to the place where you live?: Yes      Need for Family Participation in Patient Care: Yes (Comment) Care giver support system in place?: Yes (comment) Current home services: DME (rollator, RW) Criminal Activity/Legal Involvement Pertinent to Current Situation/Hospitalization: No - Comment as needed  Activities of Daily Living   ADL Screening (condition at time of admission) Independently performs ADLs?: Yes (appropriate for developmental age) Is the patient deaf or have difficulty hearing?: No Does the patient have difficulty seeing, even when wearing glasses/contacts?:  No Does the patient have difficulty concentrating, remembering, or making decisions?: No  Permission Sought/Granted   Permission granted to share information with : Yes, Verbal Permission Granted              Emotional Assessment Appearance:: Appears stated age Attitude/Demeanor/Rapport: Engaged Affect (typically observed): Accepting Orientation: : Oriented to Self, Oriented to Place, Oriented to  Time, Oriented to Situation Alcohol / Substance Use: Not Applicable Psych Involvement: No (comment)  Admission diagnosis:  Dysuria [R30.0] SOB (shortness of breath) [R06.02] Exertional dyspnea [R06.09] Pneumonia of left lung due to infectious organism, unspecified part of lung [J18.9] Patient Active Problem List   Diagnosis Date Noted   SOB (shortness of breath) 06/18/2023   HTN (hypertension) 06/18/2023   Complicated UTI (urinary tract infection) 05/26/2023   Diarrhea 05/26/2023   Blocked nephrostomy tube with hydronephrosis and hydroureter (HCC) 04/05/2023   Hypertension    Pressure injury of skin 02/11/2023   Severe sepsis with acute organ dysfunction (HCC) 02/10/2023   Obesity (BMI 30-39.9) 11/22/2022   Sepsis due to gram-negative UTI (HCC) 11/21/2022   Acute pyelonephritis 11/21/2022   Dyspnea 11/21/2022   Chest pain 11/21/2022   Hypokalemia 10/25/2022   Chronic obstructive pulmonary disease (COPD) (HCC) 10/24/2022   Iron deficiency anemia 10/24/2022   Symptomatic anemia 08/08/2022   COPD (chronic obstructive pulmonary disease) (HCC) 08/07/2022   Sepsis due to pneumonia (HCC) 06/12/2022   Multifocal pneumonia  06/12/2022   GERD without esophagitis 06/12/2022   Generalized weakness 06/12/2022   Nephrostomy status (HCC) 06/12/2022   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) 04/16/2022   GI bleed 03/14/2022   Acute on chronic anemia 02/24/2022   Hypotension 02/24/2022   Acute cystitis with hematuria    Lymphedema 02/08/2022   Hematuria 12/24/2021    History of pulmonary embolus (PE) 12/24/2021   Chronic radiation cystitis 11/17/2021   Hydronephrosis of left kidney    Gastric antral vascular ectasia    Complicated urinary tract infection 09/13/2021   AKI (acute kidney injury) (HCC) 09/13/2021   Melena 07/18/2021   Acute on chronic blood loss anemia 07/18/2021   HLD (hyperlipidemia) 06/20/2021   Chronic diastolic CHF (congestive heart failure) (HCC) 06/20/2021   Risk factors for obstructive sleep apnea 11/10/2020   IDA (iron deficiency anemia) 09/21/2020   Endometrial cancer (HCC) 09/13/2020   Recurrent deep vein thrombosis (DVT) (HCC) 09/06/2020   Obesity, Class III, BMI 40-49.9 (morbid obesity) (HCC) 08/23/2020   Dyslipidemia 08/23/2020   PCP:  [facility]:   [id number] [place], [place] - [address] AT [facility] OF [address] [place] [address] [place] [address] Phone: [phone] Fax: [phone]     Social Drivers of Health (SDOH) Social History: SDOH Screenings   Food Insecurity: No Food Insecurity (06/19/2023)  Housing: Unknown (06/19/2023)  Transportation Needs: No Transportation Needs (06/19/2023)  Utilities: Not At Risk (06/19/2023)  Depression (PHQ2-9): Low Risk  (04/11/2022)  Tobacco Use: Low Risk  (06/18/2023)   SDOH Interventions:     Readmission Risk Interventions    05/29/2023    9:42 AM 05/27/2023   10:59 AM 04/07/2023   10:32 AM  Readmission Risk Prevention Plan  Transportation Screening  Complete Complete  PCP or Specialist Appt within 3-5 Days   Complete  Social Work Consult for Recovery Care Planning/Counseling  Complete   Palliative Care Screening  Not Applicable Not Applicable  Medication Review Oceanographer)  Complete Complete  HRI or Home Care Consult Patient refused    SW Recovery Care/Counseling Consult Complete    Palliative Care Screening Not Applicable    Skilled Nursing Facility Not Applicable

## 2023-06-20 NOTE — Plan of Care (Signed)
  Problem: Education: Goal: Knowledge of General Education information will improve Description: Including pain rating scale, medication(s)/side effects and non-pharmacologic comfort measures Outcome: Progressing   Problem: Clinical Measurements: Goal: Respiratory complications will improve Outcome: Progressing   Problem: Nutrition: Goal: Adequate nutrition will be maintained Outcome: Progressing   Problem: Coping: Goal: Level of anxiety will decrease Outcome: Progressing   Problem: Pain Management: Goal: General experience of comfort will improve Outcome: Progressing   Problem: Safety: Goal: Ability to remain free from injury will improve Outcome: Progressing   Problem: Skin Integrity: Goal: Risk for impaired skin integrity will decrease Outcome: Progressing

## 2023-06-21 DIAGNOSIS — J449 Chronic obstructive pulmonary disease, unspecified: Secondary | ICD-10-CM | POA: Diagnosis not present

## 2023-06-21 DIAGNOSIS — Z936 Other artificial openings of urinary tract status: Secondary | ICD-10-CM | POA: Diagnosis not present

## 2023-06-21 DIAGNOSIS — R0602 Shortness of breath: Secondary | ICD-10-CM | POA: Diagnosis not present

## 2023-06-21 DIAGNOSIS — I5032 Chronic diastolic (congestive) heart failure: Secondary | ICD-10-CM | POA: Diagnosis not present

## 2023-06-21 LAB — CBC
HCT: 28.8 % — ABNORMAL LOW (ref 36.0–46.0)
Hemoglobin: 8.8 g/dL — ABNORMAL LOW (ref 12.0–15.0)
MCH: 28.5 pg (ref 26.0–34.0)
MCHC: 30.6 g/dL (ref 30.0–36.0)
MCV: 93.2 fL (ref 80.0–100.0)
Platelets: 253 10*3/uL (ref 150–400)
RBC: 3.09 MIL/uL — ABNORMAL LOW (ref 3.87–5.11)
RDW: 15.9 % — ABNORMAL HIGH (ref 11.5–15.5)
WBC: 4.3 10*3/uL (ref 4.0–10.5)
nRBC: 0 % (ref 0.0–0.2)

## 2023-06-21 NOTE — Evaluation (Signed)
Occupational Therapy Evaluation Patient Details Name: Courtney Mack MRN: [id number] DOB: 02-21-1956 Today's Date: 06/21/2023   History of Present Illness Pt. is a 67 y.o. female who was admitted to [facility] with SOB, COPD,  CHF, recurrent Hydronephrosis s/p nephrostomy, tube placement, recurrent UTI, spesis due to UTI, PMHx of Bilateral PE's,a nd DVT s/p IV filter, not on AC.   Clinical Impression   Pt. Presents with  SOB, weakness, limited activity tolerance, and limited functional mobility which limits her ability to complete basic ADL and IADL functioning.  Pt. Resides at home with her daughter. Pt. was independent with ADLs/IADL functioning, and reports assist from her family as needed. Pt. SpO2 95% on RA, HR 97 bpms on RA. Anticipate Pt. Requires MinA UE ADLs, and ModA LE ADLs.  Pt. Education was provided about A/E for LE ADLs, and energy conservation, work simplification strategies for ADLs, and PLB techniques. Pt. Was provided with a visual handout. Pt. Will benefit from OT services for ADL training, A/E training, and pt. education about energy conservation, work simplification strategies, PLB techniques, home modification, and DME.       If plan is discharge home, recommend the following: A lot of help with bathing/dressing/bathroom;Assistance with cooking/housework    Functional Status Assessment  Patient has had a recent decline in their functional status and/or demonstrates limited ability to make significant improvements in function in a reasonable and predictable amount of time  Equipment Recommendations       Recommendations for Other Services       Precautions / Restrictions Precautions Precautions: Fall Restrictions Weight Bearing Restrictions Per Provider Order: No      Mobility Bed Mobility Overal bed mobility: Needs Assistance             General bed mobility comments: Pt. seen a bed level    Transfers                          Balance                                            ADL either performed or assessed with clinical judgement   ADL Overall ADL's : Needs assistance/impaired Eating/Feeding: Independent;Set up   Grooming: Independent;Set up           Upper Body Dressing : Minimal assistance   Lower Body Dressing: Moderate assistance;Maximal assistance                       Vision Baseline Vision/History: 1 Wears glasses Patient Visual Report: No change from baseline       Perception         Praxis         Pertinent Vitals/Pain Pain Assessment Pain Assessment: No/denies pain     Extremity/Trunk Assessment Upper Extremity Assessment Upper Extremity Assessment: Overall WFL for tasks assessed           Communication Communication Communication: No apparent difficulties Cueing Techniques: Verbal cues   Cognition Arousal: Alert Behavior During Therapy: WFL for tasks assessed/performed Overall Cognitive Status: Within Functional Limits for tasks assessed                                 General Comments: AOx4. Pt pleasant and willing to participate in  PT session.     General Comments       Exercises     Shoulder Instructions      Home Living Family/patient expects to be discharged to:: Private residence Living Arrangements: Children Available Help at Discharge: Family;Available 24 hours/day Type of Home: Apartment Home Access: Stairs to enter Entrance Stairs-Number of Steps: 1 Entrance Stairs-Rails: None Home Layout: One level     Bathroom Shower/Tub: Engineer, production Accessibility: Yes   Home Equipment: Rollator (4 wheels);Cane - single point;BSC/3in1;Shower seat;Wheelchair - manual   Additional Comments: sponge bathes at baseline      Prior Functioning/Environment Prior Level of Function : Needs assist             Mobility Comments: amb in house with rollator, has manual w/c for community distances ADLs  Comments: Reports independent with bathing, and dressing.        OT Problem List:        OT Treatment/Interventions: Self-care/ADL training;Therapeutic exercise;Therapeutic activities;Patient/family education    OT Goals(Current goals can be found in the care plan section) Acute Rehab OT Goals Patient Stated Goal: To return home OT Goal Formulation: With patient Time For Goal Achievement: 07/05/23 Potential to Achieve Goals: Good  OT Frequency: Min 2X/week    Co-evaluation              AM-PAC OT "6 Clicks" Daily Activity     Outcome Measure Help from another person eating meals?: None Help from another person taking care of personal grooming?: A Little Help from another person toileting, which includes using toliet, bedpan, or urinal?: A Lot Help from another person bathing (including washing, rinsing, drying)?: A Lot Help from another person to put on and taking off regular upper body clothing?: A Little Help from another person to put on and taking off regular lower body clothing?: A Lot 6 Click Score: 16   End of Session Equipment Utilized During Treatment: Gait belt  Activity Tolerance: Patient tolerated treatment well Patient left: in bed;with call bell/phone within reach;with bed alarm set  OT Visit Diagnosis: Unsteadiness on feet (R26.81)                Time: 1324-4010 OT Time Calculation (min): 21 min Charges:  OT General Charges $OT Visit: 1 Visit OT Evaluation $OT Eval Moderate Complexity: 1 Mod  Olegario Messier, MS, OTR/L   Olegario Messier 06/21/2023, 1:26 PM

## 2023-06-21 NOTE — Evaluation (Signed)
Physical Therapy Evaluation Patient Details Name: Courtney Mack MRN: [id number] DOB: 08-Jul-1956 Today's Date: 06/21/2023  History of Present Illness  Pt. is a 67 y.o. female who was admitted to [facility] with SOB, COPD,  CHF, recurrent Hydronephrosis s/p nephrostomy, tube placement, recurrent UTI, spesis due to UTI, PMHx of Bilateral PE's,a nd DVT s/p IV filter, not on AC.   Clinical Impression  Pt received in Semi-Fowler's position and agreeable to therapy.  Pt enthusiastic about getting up so that she could go home.  Pt performed mobility with modA from therapist and bed features to come upright at EOB.  Pt requires assistance with donning new brief.  Pt then stood and ambulated in the room and outside into the hallway.  Pt became very fatigued, and somewhat short of breath, however SpO2 remained >90%.  Pt elected to return to room after short ambulation attempt.  Pt returned to chair and was left with all needs met and nurse in room.  Pt still very weak and has lack of endurance to ambulate household distances at this time.  Pt makes wish of going home evident throughout the session.         If plan is discharge home, recommend the following: A little help with walking and/or transfers;A lot of help with bathing/dressing/bathroom;Assist for transportation;Help with stairs or ramp for entrance   Can travel by private vehicle        Equipment Recommendations Rolling walker (2 wheels)  Recommendations for Other Services       Functional Status Assessment Patient has had a recent decline in their functional status and demonstrates the ability to make significant improvements in function in a reasonable and predictable amount of time.     Precautions / Restrictions Precautions Precautions: Fall Restrictions Weight Bearing Restrictions Per Provider Order: No      Mobility  Bed Mobility Overal bed mobility: Needs Assistance Bed Mobility: Supine to Sit       Sit to supine:  Modified independent (Device/Increase time)   General bed mobility comments: Pt able to come upright without any complication    Transfers Overall transfer level: Needs assistance Equipment used: Rolling walker (2 wheels) Transfers: Sit to/from Stand Sit to Stand: Min assist           General transfer comment: Pt utilizes FWW to come upright into standing    Ambulation/Gait Ambulation/Gait assistance: Contact guard assist Gait Distance (Feet): 40 Feet Assistive device: Rolling walker (2 wheels) Gait Pattern/deviations: Step-to pattern Gait velocity: decreased     General Gait Details: slow and steady, however pt showing signs of fatigue and SOB  Stairs            Wheelchair Mobility     Tilt Bed    Modified Rankin (Stroke Patients Only)       Balance Overall balance assessment: Needs assistance Sitting-balance support: Feet supported Sitting balance-Leahy Scale: Good     Standing balance support: Bilateral upper extremity supported, During functional activity, Reliant on assistive device for balance Standing balance-Leahy Scale: Fair                               Pertinent Vitals/Pain Pain Assessment Pain Assessment: No/denies pain    Home Living Family/patient expects to be discharged to:: Private residence Living Arrangements: Children Available Help at Discharge: Family;Available 24 hours/day Type of Home: Apartment Home Access: Stairs to enter Entrance Stairs-Rails: None Entrance Stairs-Number of Steps: 1  Home Layout: One level Home Equipment: Rollator (4 wheels);Cane - single point;BSC/3in1;Shower seat;Wheelchair - manual Additional Comments: sponge bathes at baseline    Prior Function Prior Level of Function : Needs assist             Mobility Comments: amb in house with rollator, has manual w/c for community distances ADLs Comments: Reports independent with bathing, and dressing.     Extremity/Trunk Assessment    Upper Extremity Assessment Upper Extremity Assessment: Overall WFL for tasks assessed    Lower Extremity Assessment Lower Extremity Assessment: Generalized weakness       Communication   Communication Communication: No apparent difficulties Cueing Techniques: Verbal cues  Cognition Arousal: Alert Behavior During Therapy: WFL for tasks assessed/performed Overall Cognitive Status: Within Functional Limits for tasks assessed                                 General Comments: AOx4. Pt pleasant and willing to participate in PT session.        General Comments      Exercises     Assessment/Plan    PT Assessment Patient needs continued PT services  PT Problem List Decreased activity tolerance;Decreased mobility       PT Treatment Interventions DME instruction;Gait training;Therapeutic exercise    PT Goals (Current goals can be found in the Care Plan section)  Acute Rehab PT Goals Patient Stated Goal: To go home PT Goal Formulation: With patient Time For Goal Achievement: 07/05/23 Potential to Achieve Goals: Fair    Frequency Min 1X/week     Co-evaluation               AM-PAC PT "6 Clicks" Mobility  Outcome Measure Help needed turning from your back to your side while in a flat bed without using bedrails?: A Little Help needed moving from lying on your back to sitting on the side of a flat bed without using bedrails?: A Little Help needed moving to and from a bed to a chair (including a wheelchair)?: A Little Help needed standing up from a chair using your arms (e.g., wheelchair or bedside chair)?: A Little Help needed to walk in hospital room?: A Little Help needed climbing 3-5 steps with a railing? : A Lot 6 Click Score: 17    End of Session Equipment Utilized During Treatment: Gait belt Activity Tolerance: Patient tolerated treatment well;Patient limited by fatigue Patient left: with call bell/phone within reach;in chair Nurse  Communication: Mobility status PT Visit Diagnosis: Other abnormalities of gait and mobility (R26.89);Difficulty in walking, not elsewhere classified (R26.2);Muscle weakness (generalized) (M62.81)    Time: 1610-9604 PT Time Calculation (min) (ACUTE ONLY): 28 min   Charges:   PT Evaluation $PT Eval Low Complexity: 1 Low PT Treatments $Therapeutic Activity: 8-22 mins PT General Charges $$ ACUTE PT VISIT: 1 Visit         Nolon Bussing, PT, DPT Physical Therapist - [facility]  06/21/23, 4:51 PM

## 2023-06-21 NOTE — Progress Notes (Addendum)
[facility]  PROGRESS NOTE  Courtney Mack:[id number] DOB: 06-11-1956 DOA: 06/18/2023 PCP: [facility]   Brief HPI:     67 y.o. female with medical history significant of left-sided hydronephrosis status post nephrostomy tube placement, recurrent UTI and sepsis due to UTI, hypertension, COPD, history of extensive bilateral PE and DVT (s/p of IVC filter, not on AC), dCHF, who presents with SOB and dysuria.   Patient was recently hospitalized from 11/18 - 11/21 due to complicated UTI.  Urine culture was positive for pansensitive E. coli. Pt is s/p IR exchange of left nephrostomy tube on 11/19.  Pt states that she has shortness of breath and dry cough for one day.  She is admitted for further evaluation.    Assessment/Plan:    Shortness of Breath: -Resolved, unclear etiology She denies any fever, cough,  chest pain. CTA negative for acute pathology,  Covid / Flu / RSV neg, BNP wnl, troponin neg. As w/u is negative and symptoms are resolved,  Monitor closely.    Dysuria: Left nephrostomy tube: Concern for uti given frequent UTIs, here complaining of dysuria and suprapubic pain. Also with some flank tenderness but this is chronic. No fever or leukocytosis to strongly suggest systemic infection. Urinalysis has not yet been sent. She grew pan-sensitive e coli in September, we may need to treat empirically. Had nephrostomy tube exchanged last month, gets this done every 2 months. She has some tenderness left flank but this is chronic, no fever or leukocytosis to suggest pyelonephritis.  Dr Idelle Leech spoke w/ radiology, they say the upper third of each kidney is visible on the CTA, so incomplete view, but no signs of pyelo or other infectious complication from what's visible. -nephrostomy tube. Unfortunately, she has already received ceftriaxone -Blood cultures x 2 are negative to date -Urine culture grew multiple species -Will continue Rocephin empirically and  treat for 5 days  Hypokalemia -Replete   COPD: -Stable Continue home Dulera   HFpEF: Appears compensated Resumed Lasix   Debility / Deconditioning: - PT/ OT consult obtained -Recommend to go to skilled nursing facility for rehab   Obesity: Diet and Exercise discussed in detail.      Medications     vitamin C  1,000 mg Oral Daily   enoxaparin (LOVENOX) injection  40 mg Subcutaneous Q24H   ferrous sulfate  325 mg Oral Q breakfast   furosemide  20 mg Oral Daily   mometasone-formoterol  2 puff Inhalation BID   multivitamin with minerals  1 tablet Oral Daily   pantoprazole  40 mg Oral BID   rosuvastatin  10 mg Oral Daily     Data Reviewed:   CBG:  No results for input(s): "GLUCAP" in the last 168 hours.  SpO2: 97 %    Vitals:   06/20/23 1501 06/20/23 2154 06/21/23 0452 06/21/23 0804  BP: (!) 113/50 (!) 107/53  (!) 123/99  Pulse: 78 81  95  Resp: 16 20  18   Temp: 98 F (36.7 C) 98.4 F (36.9 C)  98 F (36.7 C)  TempSrc:  Oral    SpO2: 97% 99%  97%  Weight:   87 kg   Height:          Data Reviewed:  Basic Metabolic Panel: Recent Labs  Lab 06/18/23 1235 06/18/23 1905 06/19/23 0359  NA 137  --  140  K 3.1*  --  3.5  CL 104  --  110  CO2 24  --  24  GLUCOSE 123*  --  94  BUN 28*  --  23  CREATININE 1.13*  --  0.68  CALCIUM 8.5*  --  8.3*  MG  --  2.2  --     CBC: Recent Labs  Lab 06/18/23 1235 06/19/23 0359 06/21/23 0257  WBC 4.6 4.1 4.3  HGB 8.6* 7.9* 8.8*  HCT 28.2* 25.9* 28.8*  MCV 94.3 92.8 93.2  PLT 211 221 253    LFT No results for input(s): "AST", "ALT", "ALKPHOS", "BILITOT", "PROT", "ALBUMIN" in the last 168 hours.   Antibiotics: Anti-infectives (From admission, onward)    Start     Dose/Rate Route Frequency Ordered Stop   06/20/23 0045  cefdinir (OMNICEF) capsule 600 mg        600 mg Oral Daily 06/19/23 2351 06/20/23 0104   06/19/23 1915  cefTRIAXone (ROCEPHIN) 1 g in sodium chloride 0.9 % 100 mL IVPB        1  g 200 mL/hr over 30 Minutes Intravenous Every 24 hours 06/19/23 1825     06/18/23 1800  cefTRIAXone (ROCEPHIN) 1 g in sodium chloride 0.9 % 100 mL IVPB        1 g 200 mL/hr over 30 Minutes Intravenous  Once 06/18/23 1748 06/18/23 2040   06/18/23 1800  azithromycin (ZITHROMAX) 500 mg in sodium chloride 0.9 % 250 mL IVPB        500 mg 250 mL/hr over 60 Minutes Intravenous  Once 06/18/23 1748 06/18/23 2210        DVT prophylaxis: Lovenox  Code Status: Full code  Family Communication: No family at bedside   CONSULTS    Subjective   Denies any complaints.  Denies abdominal pain or dysuria.   Objective    Physical Examination:   General-appears in no acute distress Heart-S1-S2, regular, no murmur auscultated Lungs-clear to auscultation bilaterally, no wheezing or crackles auscultated Abdomen-soft, nontender, no organomegaly Extremities-no edema in the lower extremities Neuro-alert, oriented x3, no focal deficit noted  Status is: Inpatient:          Meredeth Ide   [facility] If 7PM-7AM, please contact night-coverage at [email or web address], Office  [phone]   06/21/2023, 10:46 AM  LOS: 0 days

## 2023-06-22 DIAGNOSIS — R0602 Shortness of breath: Secondary | ICD-10-CM | POA: Diagnosis not present

## 2023-06-22 DIAGNOSIS — R300 Dysuria: Secondary | ICD-10-CM

## 2023-06-22 MED ORDER — CHLORHEXIDINE GLUCONATE CLOTH 2 % EX PADS
6.0000 | MEDICATED_PAD | Freq: Every day | CUTANEOUS | Status: DC
  Administered 2023-06-22 – 2023-06-25 (×4): 6 via TOPICAL

## 2023-06-22 NOTE — Hospital Course (Signed)
67 y.o. F with left hydronephrosis s/p PCN, recurrent UTI sepsis, HTN, COPD, hx extensive bilateral PE and DVT (has IVC filter, not on AC), dCHF, who presents with SOB and dysuria.   Recently hospitalized from 11/18 - 11/21 due to complicated UTI, UCx pansensitive E. Coli, underwent IR exchange of PCN 11/19.    After discharge, developed shortness of breath and dry cough for one day, admitted for further evaluation, work up unremarkable, symptoms resolved, awaiting SNF.

## 2023-06-22 NOTE — Assessment & Plan Note (Signed)
-   Supplemented and resolved 

## 2023-06-22 NOTE — Assessment & Plan Note (Signed)
Nephrostomy tubes were changed during last admission.  She gets them changed every 2 months.

## 2023-06-22 NOTE — Plan of Care (Signed)

## 2023-06-22 NOTE — NC FL2 (Signed)
[place] MEDICAID FL2 LEVEL OF CARE FORM     IDENTIFICATION  Patient Name: Courtney Mack Birthdate: [birth year] Sex: female Admission Date (Current Location): 06/18/2023  [place] and [place] Number:  Chiropodist and Address:  [facility], [address] [place], [place] [address]      Provider Number: [id number]  Attending Physician Name and Address:  Alberteen Sam, *  Relative Name and Phone Number:  Warnell Bureau (Daughter)  [phone] (Mobile)    Current Level of Care: Hospital Recommended Level of Care: Skilled Nursing Facility Prior Approval Number:    Date Approved/Denied:   PASRR Number: [id number]  Discharge Plan:      Current Diagnoses: Patient Active Problem List   Diagnosis Date Noted   Dysuria 06/22/2023   SOB (shortness of breath) 06/18/2023   HTN (hypertension) 06/18/2023   Complicated UTI (urinary tract infection) 05/26/2023   Diarrhea 05/26/2023   Blocked nephrostomy tube with hydronephrosis and hydroureter (HCC) 04/05/2023   Hypertension    Pressure injury of skin 02/11/2023   Severe sepsis with acute organ dysfunction (HCC) 02/10/2023   Obesity (BMI 30-39.9) 11/22/2022   Sepsis due to gram-negative UTI (HCC) 11/21/2022   Acute pyelonephritis 11/21/2022   Dyspnea 11/21/2022   Chest pain 11/21/2022   Hypokalemia 10/25/2022   Chronic obstructive pulmonary disease (COPD) (HCC) 10/24/2022   Iron deficiency anemia 10/24/2022   Symptomatic anemia 08/08/2022   COPD (chronic obstructive pulmonary disease) (HCC) 08/07/2022   Sepsis due to pneumonia (HCC) 06/12/2022   Multifocal pneumonia 06/12/2022   GERD without esophagitis 06/12/2022   Generalized weakness 06/12/2022   Nephrostomy status (HCC) 06/12/2022   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) 04/16/2022   GI bleed 03/14/2022   Acute on chronic anemia 02/24/2022   Hypotension 02/24/2022   Acute cystitis with hematuria     Lymphedema 02/08/2022   Hematuria 12/24/2021   History of pulmonary embolus (PE) 12/24/2021   Chronic radiation cystitis 11/17/2021   Hydronephrosis of left kidney    Gastric antral vascular ectasia    Complicated urinary tract infection 09/13/2021   AKI (acute kidney injury) (HCC) 09/13/2021   Melena 07/18/2021   Acute on chronic blood loss anemia 07/18/2021   HLD (hyperlipidemia) 06/20/2021   Chronic diastolic CHF (congestive heart failure) (HCC) 06/20/2021   Risk factors for obstructive sleep apnea 11/10/2020   IDA (iron deficiency anemia) 09/21/2020   Endometrial cancer (HCC) 09/13/2020   Recurrent deep vein thrombosis (DVT) (HCC) 09/06/2020   Obesity, Class III, BMI 40-49.9 (morbid obesity) (HCC) 08/23/2020   Dyslipidemia 08/23/2020    Orientation RESPIRATION BLADDER Height & Weight     Self, Time, Situation, Place  Normal Incontinent, External catheter Weight: 191 lb 12.8 oz (87 kg) Height:  4\' 11"  (149.9 cm)  BEHAVIORAL SYMPTOMS/MOOD NEUROLOGICAL BOWEL NUTRITION STATUS      Incontinent Diet (heart)  AMBULATORY STATUS COMMUNICATION OF NEEDS Skin   Limited Assist Verbally Other (Comment) (redness)                       Personal Care Assistance Level of Assistance  Bathing, Feeding, Dressing Bathing Assistance: Limited assistance Feeding assistance: Limited assistance Dressing Assistance: Limited assistance     Functional Limitations Info             SPECIAL CARE FACTORS FREQUENCY  PT (By licensed PT), OT (By licensed OT)     PT Frequency: 5 times per week OT Frequency: 5 times per  week            Contractures      Additional Factors Info  Code Status, Allergies Code Status Info: full Allergies Info: nka           Current Medications (06/22/2023):  This is the current hospital active medication list Current Facility-Administered Medications  Medication Dose Route Frequency Provider Last Rate Last Admin   acetaminophen (TYLENOL) tablet  650 mg  650 mg Oral Q6H PRN Lorretta Harp, MD       albuterol (PROVENTIL) (2.5 MG/3ML) 0.083% nebulizer solution 2.5 mg  2.5 mg Nebulization Q4H PRN Lorretta Harp, MD       ascorbic acid (VITAMIN C) tablet 1,000 mg  1,000 mg Oral Daily Lorretta Harp, MD   1,000 mg at 06/22/23 0929   cefTRIAXone (ROCEPHIN) 1 g in sodium chloride 0.9 % 100 mL IVPB  1 g Intravenous Q24H Kathrynn Running, MD 200 mL/hr at 06/21/23 2032 1 g at 06/21/23 2032   Chlorhexidine Gluconate Cloth 2 % PADS 6 each  6 each Topical Daily Alberteen Sam, MD   6 each at 06/22/23 7829   dextromethorphan-guaiFENesin (MUCINEX DM) 30-600 MG per 12 hr tablet 1 tablet  1 tablet Oral BID PRN Lorretta Harp, MD       enoxaparin (LOVENOX) injection 40 mg  40 mg Subcutaneous Q24H Lorretta Harp, MD   40 mg at 06/22/23 5621   ferrous sulfate tablet 325 mg  325 mg Oral Q breakfast Lorretta Harp, MD   325 mg at 06/22/23 3086   furosemide (LASIX) tablet 20 mg  20 mg Oral Daily Kathrynn Running, MD   20 mg at 06/22/23 5784   mometasone-formoterol (DULERA) 100-5 MCG/ACT inhaler 2 puff  2 puff Inhalation BID Lorretta Harp, MD   2 puff at 06/22/23 0930   multivitamin with minerals tablet 1 tablet  1 tablet Oral Daily Lorretta Harp, MD   1 tablet at 06/22/23 0929   ondansetron (ZOFRAN) injection 4 mg  4 mg Intravenous Q8H PRN Lorretta Harp, MD       pantoprazole (PROTONIX) EC tablet 40 mg  40 mg Oral BID Lorretta Harp, MD   40 mg at 06/22/23 6962   rosuvastatin (CRESTOR) tablet 10 mg  10 mg Oral Daily Lorretta Harp, MD   10 mg at 06/22/23 9528   simethicone (MYLICON) chewable tablet 80 mg  80 mg Oral Q6H PRN Lorretta Harp, MD         Discharge Medications: Please see discharge summary for a list of discharge medications.  Relevant Imaging Results:  Relevant Lab Results:   Additional Information SS#: [id number]  Page Pucciarelli E Bricelyn Freestone, LCSW

## 2023-06-22 NOTE — TOC Initial Note (Signed)
Transition of Care [facility]) - Initial/Assessment Note    Patient Details  Name: Courtney Mack MRN: [id number] Date of Birth: [birth year]  Transition of Care [facility]) CM/SW Contact:    Liliana Cline, LCSW Phone Number: 06/22/2023, 11:42 AM  Clinical Narrative:                 CSW spoke with patient at bedside.  Patient is from home with her daughter and 5 grandchildren. PCP is [facility]. Pharmacy is [facility]. Patient states she has had HH in the past through several agencies. Patient has been to SNF in the past - [facility]. Patient has a rollator, cane, 3in1, shower chair, and wheelchair at home. Patient's daughter provides transportation. CSW explained therapy recs for SNF for STR. Patient is agreeable. She prefers [facility] since she has been there in the past. CSW started the bed search.  Expected Discharge Plan: Skilled Nursing Facility Barriers to Discharge: Continued Medical Work up   Patient Goals and CMS Choice Patient states their goals for this hospitalization and ongoing recovery are:: SNF CMS Medicare.gov Compare Post Acute Care list provided to:: Patient Choice offered to / list presented to : Patient      Expected Discharge Plan and Services   Discharge Planning Services: CM Consult   Living arrangements for the past 2 months: Single Family Home                   DME Agency: NA                  Prior Living Arrangements/Services Living arrangements for the past 2 months: Single Family Home Lives with:: Adult Children, Relatives Patient language and need for interpreter reviewed:: Yes Do you feel safe going back to the place where you live?: Yes      Need for Family Participation in Patient Care: Yes (Comment) Care giver support system in place?: Yes (comment) Current home services: DME Criminal Activity/Legal Involvement Pertinent to Current Situation/Hospitalization: No - Comment as needed  Activities of Daily Living   ADL  Screening (condition at time of admission) Independently performs ADLs?: Yes (appropriate for developmental age) Is the patient deaf or have difficulty hearing?: No Does the patient have difficulty seeing, even when wearing glasses/contacts?: No Does the patient have difficulty concentrating, remembering, or making decisions?: No  Permission Sought/Granted Permission sought to share information with : Facility Industrial/product designer granted to share information with : Yes, Verbal Permission Granted     Permission granted to share info w AGENCY: SNFs        Emotional Assessment Appearance:: Appears stated age Attitude/Demeanor/Rapport: Engaged Affect (typically observed): Accepting Orientation: : Oriented to Self, Oriented to Place, Oriented to  Time, Oriented to Situation Alcohol / Substance Use: Not Applicable Psych Involvement: No (comment)  Admission diagnosis:  Dysuria [R30.0] SOB (shortness of breath) [R06.02] Exertional dyspnea [R06.09] Pneumonia of left lung due to infectious organism, unspecified part of lung [J18.9] Patient Active Problem List   Diagnosis Date Noted   Dysuria 06/22/2023   SOB (shortness of breath) 06/18/2023   HTN (hypertension) 06/18/2023   Complicated UTI (urinary tract infection) 05/26/2023   Diarrhea 05/26/2023   Blocked nephrostomy tube with hydronephrosis and hydroureter (HCC) 04/05/2023   Hypertension    Pressure injury of skin 02/11/2023   Severe sepsis with acute organ dysfunction (HCC) 02/10/2023   Obesity (BMI 30-39.9) 11/22/2022   Sepsis due to gram-negative UTI (HCC) 11/21/2022   Acute pyelonephritis 11/21/2022  Dyspnea 11/21/2022   Chest pain 11/21/2022   Hypokalemia 10/25/2022   Chronic obstructive pulmonary disease (COPD) (HCC) 10/24/2022   Iron deficiency anemia 10/24/2022   Symptomatic anemia 08/08/2022   COPD (chronic obstructive pulmonary disease) (HCC) 08/07/2022   Sepsis due to pneumonia (HCC) 06/12/2022    Multifocal pneumonia 06/12/2022   GERD without esophagitis 06/12/2022   Generalized weakness 06/12/2022   Nephrostomy status (HCC) 06/12/2022   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) 04/16/2022   GI bleed 03/14/2022   Acute on chronic anemia 02/24/2022   Hypotension 02/24/2022   Acute cystitis with hematuria    Lymphedema 02/08/2022   Hematuria 12/24/2021   History of pulmonary embolus (PE) 12/24/2021   Chronic radiation cystitis 11/17/2021   Hydronephrosis of left kidney    Gastric antral vascular ectasia    Complicated urinary tract infection 09/13/2021   AKI (acute kidney injury) (HCC) 09/13/2021   Melena 07/18/2021   Acute on chronic blood loss anemia 07/18/2021   HLD (hyperlipidemia) 06/20/2021   Chronic diastolic CHF (congestive heart failure) (HCC) 06/20/2021   Risk factors for obstructive sleep apnea 11/10/2020   IDA (iron deficiency anemia) 09/21/2020   Endometrial cancer (HCC) 09/13/2020   Recurrent deep vein thrombosis (DVT) (HCC) 09/06/2020   Obesity, Class III, BMI 40-49.9 (morbid obesity) (HCC) 08/23/2020   Dyslipidemia 08/23/2020   PCP:  [facility]:   [id number] [place], [place] - [address] AT [facility] [address] [place] [address] [place] [address] Phone: [phone] Fax: [phone]     Social Drivers of Health (SDOH) Social History: SDOH Screenings   Food Insecurity: No Food Insecurity (06/19/2023)  Housing: Unknown (06/19/2023)  Transportation Needs: No Transportation Needs (06/19/2023)  Utilities: Not At Risk (06/19/2023)  Depression (PHQ2-9): Low Risk  (04/11/2022)  Tobacco Use: Low Risk  (06/18/2023)   SDOH Interventions:     Readmission Risk Interventions    05/29/2023    9:42 AM 05/27/2023   10:59 AM 04/07/2023   10:32 AM  Readmission Risk Prevention Plan  Transportation Screening  Complete Complete  PCP or Specialist Appt within 3-5 Days    Complete  Social Work Consult for Recovery Care Planning/Counseling  Complete   Palliative Care Screening  Not Applicable Not Applicable  Medication Review Oceanographer)  Complete Complete  HRI or Home Care Consult Patient refused    SW Recovery Care/Counseling Consult Complete    Palliative Care Screening Not Applicable    Skilled Nursing Facility Not Applicable

## 2023-06-22 NOTE — Assessment & Plan Note (Addendum)
There was some initial concern for UTI given her history, as well as symptoms of dysuria suprapubic pain.    Urine cultures collected here after administration of antibiotics, so we are treating empirically for her last culture pansensitive E. coli earlier this year.  Dr. Idelle Leech spoke with radiology, who stated that the incompletely imaged kidneys and CTA were unremarkable, and overall we have a low suspicion for pyelonephritis or abscess. Blood cultures negative, urine culture contaminated. - Continue Rocephin, day 5 of 5

## 2023-06-22 NOTE — Progress Notes (Signed)
  Progress Note   Patient: Courtney Mack:[id number] DOB: 04-07-1956 DOA: 06/18/2023     0 DOS: the patient was seen and examined on 06/22/2023 at 10:46 AM      Brief hospital course: 67 y.o. F with left hydronephrosis s/p PCN, recurrent UTI sepsis, HTN, COPD, hx extensive bilateral PE and DVT (has IVC filter, not on AC), dCHF, who presents with SOB and dysuria.   Recently hospitalized from 11/18 - 11/21 due to complicated UTI, UCx pansensitive E. Coli, underwent IR exchange of PCN 11/19.    After discharge, developed shortness of breath and dry cough for one day, admitted for further evaluation, work up unremarkable, symptoms resolved, awaiting SNF.     Assessment and Plan: * SOB (shortness of breath) CTA chest showed no pathology.  Echo from last May showed normal EF, no significant hypertrophy or valvular disease.  Unclear etiology.  Stable on room air.  Seems to have been transient.  No further work up.  Dysuria There was some initial concern for UTI given her history, as well as symptoms of dysuria suprapubic pain.    Urine cultures collected here after administration of antibiotics, so we are treating empirically for her last culture pansensitive E. coli earlier this year.  Dr. Idelle Leech spoke with radiology, who stated that the incompletely imaged kidneys and CTA were unremarkable, and overall we have a low suspicion for pyelonephritis or abscess. Blood cultures negative, urine culture contaminated. - Continue Rocephin, day 5 of 5   COPD (chronic obstructive pulmonary disease) (HCC) - Continue albuterol, ICS/LABA, PPI  Nephrostomy status (HCC) Nephrostomy tubes were changed during last admission.  She gets them changed every 2 months.  Chronic diastolic CHF (congestive heart failure) (HCC) Appears euvolemic - Continue Lasix  Hypokalemia Supplemented and resolved  HTN (hypertension) Blood pressure normal - Continue Lasix  Recurrent deep vein thrombosis (DVT)  (HCC) No longer on blood thinners  IDA (iron deficiency anemia) Hemoglobin stable relative to baseline, no clinical bleeding observed or reported - Continue ferrous sulfate  Dyslipidemia - Continue Crestor  Obesity (BMI 30-39.9) BMI 38.7          Subjective: Patient is feeling okay, she is weak and tired, asked about having her tube changed again.  Has no other complaints.  Appetite good, mentation at baseline.  Nursing no concerns.     Physical Exam: BP (!) 127/56 (BP Location: Left Arm)   Pulse 76   Temp 98.8 F (37.1 C)   Resp 16   Ht 4\' 11"  (1.499 m)   Wt 87 kg   SpO2 99%   BMI 38.74 kg/m   Elderly adult female, lying in bed, interactive and appropriate, eating lunch RRR, no murmurs, no peripheral edema Respiratory normal, lungs clear without rales or wheezes Abdomen soft no tenderness palpation or guarding Attention normal, affect normal, judgment and insight appear at her baseline    Data Reviewed: No new labs today, CBC 2 days ago showed no leukocytosis, hemoglobin 8.8 at baseline CT of the chest unremarkable  Family Communication: None present    Disposition: Status is: Observation Patient is medically stable for discharge to SNF when bed available, appears no safe disposition is appropriate        Author: Alberteen Sam, MD 06/22/2023 2:32 PM  For on call review [email or web address].

## 2023-06-22 NOTE — Assessment & Plan Note (Signed)
-   Continue albuterol, ICS/LABA, PPI

## 2023-06-22 NOTE — Assessment & Plan Note (Signed)
Appears euvolemic.  Continue Lasix. 

## 2023-06-22 NOTE — Assessment & Plan Note (Signed)
Continue Crestor 

## 2023-06-22 NOTE — Assessment & Plan Note (Addendum)
CTA chest showed no pathology.  Echo from last May showed normal EF, no significant hypertrophy or valvular disease.  Unclear etiology.  Stable on room air.  Seems to have been transient.  No further work up.

## 2023-06-22 NOTE — Assessment & Plan Note (Signed)
Blood pressure normal - Continue Lasix

## 2023-06-22 NOTE — Assessment & Plan Note (Signed)
 BMI 38.7

## 2023-06-22 NOTE — Assessment & Plan Note (Signed)
No longer on blood thinners

## 2023-06-22 NOTE — Plan of Care (Signed)

## 2023-06-22 NOTE — Assessment & Plan Note (Signed)
Hemoglobin stable relative to baseline, no clinical bleeding observed or reported - Continue ferrous sulfate

## 2023-06-23 DIAGNOSIS — R0602 Shortness of breath: Secondary | ICD-10-CM | POA: Diagnosis not present

## 2023-06-23 LAB — CBC
HCT: 27.1 % — ABNORMAL LOW (ref 36.0–46.0)
Hemoglobin: 8.2 g/dL — ABNORMAL LOW (ref 12.0–15.0)
MCH: 28.2 pg (ref 26.0–34.0)
MCHC: 30.3 g/dL (ref 30.0–36.0)
MCV: 93.1 fL (ref 80.0–100.0)
Platelets: 229 10*3/uL (ref 150–400)
RBC: 2.91 MIL/uL — ABNORMAL LOW (ref 3.87–5.11)
RDW: 15.7 % — ABNORMAL HIGH (ref 11.5–15.5)
WBC: 5 10*3/uL (ref 4.0–10.5)
nRBC: 0 % (ref 0.0–0.2)

## 2023-06-23 LAB — COMPREHENSIVE METABOLIC PANEL
ALT: 10 U/L (ref 0–44)
AST: 17 U/L (ref 15–41)
Albumin: 2.8 g/dL — ABNORMAL LOW (ref 3.5–5.0)
Alkaline Phosphatase: 45 U/L (ref 38–126)
Anion gap: 5 (ref 5–15)
BUN: 22 mg/dL (ref 8–23)
CO2: 27 mmol/L (ref 22–32)
Calcium: 8.5 mg/dL — ABNORMAL LOW (ref 8.9–10.3)
Chloride: 105 mmol/L (ref 98–111)
Creatinine, Ser: 0.83 mg/dL (ref 0.44–1.00)
GFR, Estimated: >60 mL/min (ref >60)
Glucose, Bld: 160 mg/dL — ABNORMAL HIGH (ref 70–99)
Potassium: 3.7 mmol/L (ref 3.5–5.1)
Sodium: 137 mmol/L (ref 135–145)
Total Bilirubin: 0.5 mg/dL (ref <1.2)
Total Protein: 5.9 g/dL — ABNORMAL LOW (ref 6.5–8.1)

## 2023-06-23 LAB — CULTURE, BLOOD (ROUTINE X 2)
Culture: NO GROWTH
Culture: NO GROWTH
Special Requests: ADEQUATE
Special Requests: ADEQUATE

## 2023-06-23 LAB — MRSA NEXT GEN BY PCR, NASAL: MRSA by PCR Next Gen: NOT DETECTED

## 2023-06-23 NOTE — Progress Notes (Addendum)
Nurse Aide said that pt was "bleeding." RN rushed to assess the patient. Pt had scratched her back and taken some skin off with her fingernails in her lower left back, so there was scant blood. RN used antiseptic to cleanse the area and applied foam dressing. RN provided education to the patient on not scratching her back with her fingernails. RN will continue to monitor.  RN Physiological scientist.

## 2023-06-23 NOTE — Plan of Care (Signed)

## 2023-06-23 NOTE — TOC Progression Note (Signed)
Transition of Care [facility]) - Progression Note    Patient Details  Name: Courtney Mack MRN: [id number] Date of Birth: 09-19-1955  [facility]) CM/SW Contact  Garret Reddish, RN Phone Number: 06/23/2023, 4:00 PM  Clinical Narrative:    Bed offers reviewed with patient.  Patient first preference is [facility].  I have informed [name] that I have asked [facility] to review referral and awaiting an answer.  [name] reports that if [facility] is unable to accept she will accept bed offer at [facility].    TOC will continue to follow for discharge planning.    Expected Discharge Plan: Skilled Nursing Facility Barriers to Discharge: Continued Medical Work up  Expected Discharge Plan and Services   Discharge Planning Services: CM Consult   Living arrangements for the past 2 months: Single Family Home                   DME Agency: NA                   Social Determinants of Health (SDOH) Interventions SDOH Screenings   Food Insecurity: No Food Insecurity (06/19/2023)  Housing: Unknown (06/19/2023)  Transportation Needs: No Transportation Needs (06/19/2023)  Utilities: Not At Risk (06/19/2023)  Depression (PHQ2-9): Low Risk  (04/11/2022)  Tobacco Use: Low Risk  (06/18/2023)    Readmission Risk Interventions    05/29/2023    9:42 AM 05/27/2023   10:59 AM 04/07/2023   10:32 AM  Readmission Risk Prevention Plan  Transportation Screening  Complete Complete  PCP or Specialist Appt within 3-5 Days   Complete  Social Work Consult for Recovery Care Planning/Counseling  Complete   Palliative Care Screening  Not Applicable Not Applicable  Medication Review Oceanographer)  Complete Complete  HRI or Home Care Consult Patient refused    SW Recovery Care/Counseling Consult Complete    Palliative Care Screening Not Applicable    Skilled Nursing Facility Not Applicable

## 2023-06-23 NOTE — Progress Notes (Signed)
PROGRESS NOTE    Courtney Mack  GNF:[id number] DOB: [birth year] DOA: 06/18/2023 PCP: [facility]     Brief Narrative:  Courtney Mack is a 67 year old female with past medical history significant for left-sided hydronephrosis status post nephrostomy tube placement, recurrent UTI and sepsis, hypertension, COPD, history of extensive bilateral PE and DVT status post IVC filter, chronic diastolic CHF who presented to the hospital with shortness of breath and dysuria. Patient was recently hospitalized from 11/18 - 11/21 due to complicated UTI.  Urine culture was positive for pansensitive E. coli. Pt is s/p IR exchange of left nephrostomy tube on 11/19.   Patient was admitted to the hospital with her symptoms of shortness of breath and dysuria.  Patient was empirically treated with IV antibiotics with her history of nephrostomy tube placement.  New events last 24 hours / Subjective: Patient feeling well today.  Denies any shortness of breath.  Assessment & Plan:   Principal Problem:   SOB (shortness of breath) Active Problems:   Dysuria   COPD (chronic obstructive pulmonary disease) (HCC)   Chronic diastolic CHF (congestive heart failure) (HCC)   Nephrostomy status (HCC)   Hypokalemia   Recurrent deep vein thrombosis (DVT) (HCC)   HTN (hypertension)   IDA (iron deficiency anemia)   Dyslipidemia   Obesity (BMI 30-39.9)   Shortness of breath -Unclear etiology.  CTA chest was unremarkable.  Echocardiogram revealed normal EF, grade 1 diastolic dysfunction.  Resolved, stable on room air  Dysuria -With history of nephrostomy tube placement, which is changed every 2 months -Urine culture showed multiple species -Has had pansensitive E. coli earlier this year -Completed empiric Rocephin  COPD -Stable  Chronic diastolic CHF -Stable.  Lasix.  History of DVT, PE -Status post IVC filter  Dyslipidemia -Crestor  Obesity -BMI 41.46  Iron  deficiency anemia -Ferrous sulfate   DVT prophylaxis:  enoxaparin (LOVENOX) injection 40 mg Start: 06/19/23 1000  Code Status: Full code Family Communication: None at bedside Disposition Plan: SNF placement Status is: Observation   Antimicrobials:  Anti-infectives (From admission, onward)    Start     Dose/Rate Route Frequency Ordered Stop   06/20/23 0045  cefdinir (OMNICEF) capsule 600 mg        600 mg Oral Daily 06/19/23 2351 06/20/23 0104   06/19/23 1915  cefTRIAXone (ROCEPHIN) 1 g in sodium chloride 0.9 % 100 mL IVPB        1 g 200 mL/hr over 30 Minutes Intravenous Every 24 hours 06/19/23 1825 06/22/23 2359   06/18/23 1800  cefTRIAXone (ROCEPHIN) 1 g in sodium chloride 0.9 % 100 mL IVPB        1 g 200 mL/hr over 30 Minutes Intravenous  Once 06/18/23 1748 06/18/23 2040   06/18/23 1800  azithromycin (ZITHROMAX) 500 mg in sodium chloride 0.9 % 250 mL IVPB        500 mg 250 mL/hr over 60 Minutes Intravenous  Once 06/18/23 1748 06/18/23 2210        Objective: Vitals:   06/22/23 1630 06/23/23 0032 06/23/23 0444 06/23/23 0811  BP: (!) 108/57 131/70  107/65  Pulse: 79 100  93  Resp: 16 20  16   Temp: 98.2 F (36.8 C) 98.5 F (36.9 C)  98.2 F (36.8 C)  TempSrc:  Oral    SpO2: 99% 100%  97%  Weight:   93.1 kg   Height:        Intake/Output Summary (Last 24 hours) at 06/23/2023 1401  Last data filed at 06/23/2023 1000 Gross per 24 hour  Intake 720 ml  Output 725 ml  Net -5 ml   Filed Weights   06/21/23 0452 06/22/23 0500 06/23/23 0444  Weight: 87 kg 87 kg 93.1 kg    Examination:  General exam: Appears calm and comfortable  Respiratory system: Clear to auscultation. Respiratory effort normal. No respiratory distress. No conversational dyspnea.  Cardiovascular system: S1 & S2 heard, RRR. No murmurs. No pedal edema. Gastrointestinal system: Abdomen is nondistended, soft and nontender. Normal bowel sounds heard. Central nervous system: Alert and oriented. No  focal neurological deficits. Speech clear.  Extremities: Symmetric in appearance  Skin: No rashes, lesions or ulcers on exposed skin  Psychiatry: Judgement and insight appear normal. Mood & affect appropriate.   Data Reviewed: I have personally reviewed following labs and imaging studies  CBC: Recent Labs  Lab 06/18/23 1235 06/19/23 0359 06/21/23 0257 06/23/23 0251  WBC 4.6 4.1 4.3 5.0  HGB 8.6* 7.9* 8.8* 8.2*  HCT 28.2* 25.9* 28.8* 27.1*  MCV 94.3 92.8 93.2 93.1  PLT 211 221 253 229   Basic Metabolic Panel: Recent Labs  Lab 06/18/23 1235 06/18/23 1905 06/19/23 0359 06/23/23 0251  NA 137  --  140 137  K 3.1*  --  3.5 3.7  CL 104  --  110 105  CO2 24  --  24 27  GLUCOSE 123*  --  94 160*  BUN 28*  --  23 22  CREATININE 1.13*  --  0.68 0.83  CALCIUM 8.5*  --  8.3* 8.5*  MG  --  2.2  --   --    GFR: Estimated Creatinine Clearance: 64.7 mL/min (by C-G formula based on SCr of 0.83 mg/dL). Liver Function Tests: Recent Labs  Lab 06/23/23 0251  AST 17  ALT 10  ALKPHOS 45  BILITOT 0.5  PROT 5.9*  ALBUMIN 2.8*   No results for input(s): "LIPASE", "AMYLASE" in the last 168 hours. No results for input(s): "AMMONIA" in the last 168 hours. Coagulation Profile: No results for input(s): "INR", "PROTIME" in the last 168 hours. Cardiac Enzymes: No results for input(s): "CKTOTAL", "CKMB", "CKMBINDEX", "TROPONINI" in the last 168 hours. BNP (last 3 results) No results for input(s): "PROBNP" in the last 8760 hours. HbA1C: No results for input(s): "HGBA1C" in the last 72 hours. CBG: No results for input(s): "GLUCAP" in the last 168 hours. Lipid Profile: No results for input(s): "CHOL", "HDL", "LDLCALC", "TRIG", "CHOLHDL", "LDLDIRECT" in the last 72 hours. Thyroid Function Tests: No results for input(s): "TSH", "T4TOTAL", "FREET4", "T3FREE", "THYROIDAB" in the last 72 hours. Anemia Panel: No results for input(s): "VITAMINB12", "FOLATE", "FERRITIN", "TIBC", "IRON",  "RETICCTPCT" in the last 72 hours. Sepsis Labs: Recent Labs  Lab 06/18/23 1235 06/18/23 1746 06/18/23 1853  PROCALCITON <0.10  --   --   LATICACIDVEN  --  0.9 1.0    Recent Results (from the past 240 hours)  Resp panel by RT-PCR (RSV, Flu A&B, Covid) Anterior Nasal Swab     Status: None   Collection Time: 06/18/23  5:19 PM   Specimen: Anterior Nasal Swab  Result Value Ref Range Status   SARS Coronavirus 2 by RT PCR NEGATIVE NEGATIVE Final    Comment: (NOTE) SARS-CoV-2 target nucleic acids are NOT DETECTED.  The SARS-CoV-2 RNA is generally detectable in upper respiratory specimens during the acute phase of infection. The lowest concentration of SARS-CoV-2 viral copies this assay can detect is 138 copies/mL. A negative result does not  preclude SARS-Cov-2 infection and should not be used as the sole basis for treatment or other patient management decisions. A negative result may occur with  improper specimen collection/handling, submission of specimen other than nasopharyngeal swab, presence of viral mutation(s) within the areas targeted by this assay, and inadequate number of viral copies(<138 copies/mL). A negative result must be combined with clinical observations, patient history, and epidemiological information. The expected result is Negative.  Fact Sheet for Patients:  BloggerCourse.com  Fact Sheet for Healthcare Providers:  SeriousBroker.it  This test is no t yet approved or cleared by the [place] FDA and  has been authorized for detection and/or diagnosis of SARS-CoV-2 by FDA under an Emergency Use Authorization (EUA). This EUA will remain  in effect (meaning this test can be used) for the duration of the COVID-19 declaration under Section 564(b)(1) of the Act, 21 U.S.C.section 360bbb-3(b)(1), unless the authorization is terminated  or revoked sooner.       Influenza A by PCR NEGATIVE NEGATIVE Final    Influenza B by PCR NEGATIVE NEGATIVE Final    Comment: (NOTE) The Xpert Xpress SARS-CoV-2/FLU/RSV plus assay is intended as an aid in the diagnosis of influenza from Nasopharyngeal swab specimens and should not be used as a sole basis for treatment. Nasal washings and aspirates are unacceptable for Xpert Xpress SARS-CoV-2/FLU/RSV testing.  Fact Sheet for Patients: BloggerCourse.com  Fact Sheet for Healthcare Providers: SeriousBroker.it  This test is not yet approved or cleared by the [place] FDA and has been authorized for detection and/or diagnosis of SARS-CoV-2 by FDA under an Emergency Use Authorization (EUA). This EUA will remain in effect (meaning this test can be used) for the duration of the COVID-19 declaration under Section 564(b)(1) of the Act, 21 U.S.C. section 360bbb-3(b)(1), unless the authorization is terminated or revoked.     Resp Syncytial Virus by PCR NEGATIVE NEGATIVE Final    Comment: (NOTE) Fact Sheet for Patients: BloggerCourse.com  Fact Sheet for Healthcare Providers: SeriousBroker.it  This test is not yet approved or cleared by the [place] FDA and has been authorized for detection and/or diagnosis of SARS-CoV-2 by FDA under an Emergency Use Authorization (EUA). This EUA will remain in effect (meaning this test can be used) for the duration of the COVID-19 declaration under Section 564(b)(1) of the Act, 21 U.S.C. section 360bbb-3(b)(1), unless the authorization is terminated or revoked.  Performed at [facility], [address] [place], [place] [address]   Blood Culture (routine x 2)     Status: None   Collection Time: 06/18/23  6:56 PM   Specimen: BLOOD  Result Value Ref Range Status   Specimen Description BLOOD BLOOD RIGHT ARM  Final   Special Requests   Final    BOTTLES DRAWN AEROBIC AND ANAEROBIC Blood Culture adequate  volume   Culture   Final    NO GROWTH 5 DAYS Performed at [facility], [address], [place], [place] [address]    Report Status 06/23/2023 FINAL  Final  Blood Culture (routine x 2)     Status: None   Collection Time: 06/18/23  7:05 PM   Specimen: BLOOD  Result Value Ref Range Status   Specimen Description BLOOD BLOOD LEFT ARM  Final   Special Requests   Final    BOTTLES DRAWN AEROBIC AND ANAEROBIC Blood Culture adequate volume   Culture   Final    NO GROWTH 5 DAYS Performed at [facility], [address], [place], [place] [address]  Report Status 06/23/2023 FINAL  Final  Urine Culture (for pregnant, neutropenic or urologic patients or patients with an indwelling urinary catheter)     Status: Abnormal   Collection Time: 06/19/23  6:28 PM   Specimen: Urine, Clean Catch  Result Value Ref Range Status   Specimen Description   Final    URINE, CLEAN CATCH Performed at [facility], [address], [place], [place] [address]    Special Requests   Final    NONE Performed at [facility], [address] [place], [place] [address]    Culture MULTIPLE SPECIES PRESENT, SUGGEST RECOLLECTION (A)  Final   Report Status 06/20/2023 FINAL  Final  MRSA Next Gen by PCR, Nasal     Status: None   Collection Time: 06/23/23  6:37 AM   Specimen: Nasal Mucosa; Nasal Swab  Result Value Ref Range Status   MRSA by PCR Next Gen NOT DETECTED NOT DETECTED Final    Comment: (NOTE) The GeneXpert MRSA Assay (FDA approved for NASAL specimens only), is one component of a comprehensive MRSA colonization surveillance program. It is not intended to diagnose MRSA infection nor to guide or monitor treatment for MRSA infections. Test performance is not FDA approved in patients less than 46 years old. Performed at [facility], [address], [place], [place] [address]       Radiology Studies: No results found.    Scheduled Meds:  vitamin C  1,000  mg Oral Daily   Chlorhexidine Gluconate Cloth  6 each Topical Daily   enoxaparin (LOVENOX) injection  40 mg Subcutaneous Q24H   ferrous sulfate  325 mg Oral Q breakfast   furosemide  20 mg Oral Daily   mometasone-formoterol  2 puff Inhalation BID   multivitamin with minerals  1 tablet Oral Daily   pantoprazole  40 mg Oral BID   rosuvastatin  10 mg Oral Daily   Continuous Infusions:   LOS: 0 days   Time spent: 25 minutes   Noralee Stain, DO [facility] 06/23/2023, 2:01 PM   Available via Epic secure chat 7am-7pm After these hours, please refer to coverage provider listed on [email or web address]

## 2023-06-23 NOTE — Progress Notes (Signed)
Pt has history of MRSA but has not been swabbed in the past 30 days, so RN swabbed the pt and sent the specimen to the lab, per protocol.

## 2023-06-23 NOTE — Plan of Care (Signed)

## 2023-06-24 DIAGNOSIS — R0602 Shortness of breath: Secondary | ICD-10-CM | POA: Diagnosis not present

## 2023-06-24 NOTE — Plan of Care (Signed)

## 2023-06-24 NOTE — TOC Transition Note (Signed)
Transition of Care [facility]) - Discharge Note   Patient Details  Name: Courtney Mack MRN: [id number] Date of Birth: August 13, 1955  [facility] CM/SW Contact:  Garret Reddish, RN Phone Number: 06/24/2023, 3:29 PM   Clinical Narrative:     Chart reviewed.  I have informed patient [facility] is able to offer patient a bed.  [facility] reports that they will be able to accept patient on tomorrow.  Patient has accepted bed offer.    I have submitted for SNF authorization. SNF Pending authorization number is [id number].    TOC will continue to follow for discharge planning.      Final next level of care:  (TBD) Barriers to Discharge: Continued Medical Work up   Patient Goals and CMS Choice Patient states their goals for this hospitalization and ongoing recovery are:: SNF CMS Medicare.gov Compare Post Acute Care list provided to:: Patient Choice offered to / list presented to : Patient      Discharge Placement                       Discharge Plan and Services Additional resources added to the After Visit Summary for     Discharge Planning Services: CM Consult              DME Agency: NA                  Social Drivers of Health (SDOH) Interventions SDOH Screenings   Food Insecurity: No Food Insecurity (06/19/2023)  Housing: Unknown (06/19/2023)  Transportation Needs: No Transportation Needs (06/19/2023)  Utilities: Not At Risk (06/19/2023)  Depression (PHQ2-9): Low Risk  (04/11/2022)  Tobacco Use: Low Risk  (06/18/2023)     Readmission Risk Interventions    05/29/2023    9:42 AM 05/27/2023   10:59 AM 04/07/2023   10:32 AM  Readmission Risk Prevention Plan  Transportation Screening  Complete Complete  PCP or Specialist Appt within 3-5 Days   Complete  Social Work Consult for Recovery Care Planning/Counseling  Complete   Palliative Care Screening  Not Applicable Not Applicable  Medication Review Ocean[facility])  Complete  Complete  HRI or Home Care Consult Patient refused    SW Recovery Care/Counseling Consult Complete    Palliative Care Screening Not Applicable    Skilled Nursing Facility Not Applicable

## 2023-06-24 NOTE — Progress Notes (Signed)
Physical Therapy Treatment Patient Details Name: Courtney Mack MRN: [id number] DOB: December 15, 1955 Today's Date: 06/24/2023   History of Present Illness Pt. is a 67 y.o. female who was admitted to [facility] with SOB, COPD,  CHF, recurrent Hydronephrosis s/p nephrostomy, tube placement, recurrent UTI, spesis due to UTI, PMHx of Bilateral PE's,a nd DVT s/p IV filter, not on AC.    PT Comments  Patient agreeable to PT session. She continues to require physical assistance with bed mobility and transfers. Patient increased ambulation distance using rolling walker for support with CGA provided for safety. Cues for energy conservation, pacing for endurance. Mild dyspnea at end of mobility bout. Rehabilitation < 3 hours/day recommended after this hospital stay. PT will continue to follow to maximize independence.    If plan is discharge home, recommend the following: A little help with walking and/or transfers;A lot of help with bathing/dressing/bathroom;Assist for transportation;Help with stairs or ramp for entrance   Can travel by private vehicle     Yes  Equipment Recommendations   (to be determined at next level of care)    Recommendations for Other Services       Precautions / Restrictions Precautions Precautions: Fall Precaution Comments: L nephrostomy drain Restrictions Weight Bearing Restrictions Per Provider Order: No     Mobility  Bed Mobility Overal bed mobility: Needs Assistance Bed Mobility: Supine to Sit, Sit to Supine     Supine to sit: Mod assist Sit to supine: Mod assist   General bed mobility comments: assistance for trunk support to sit upright and assistance for LE support to return to bed. increased time required with heavy use of bed rails for support.    Transfers Overall transfer level: Needs assistance Equipment used: Rolling walker (2 wheels) Transfers: Sit to/from Stand Sit to Stand: Min assist           General transfer comment: steadying assistance  provided. cues for safety    Ambulation/Gait Ambulation/Gait assistance: Contact guard assist Gait Distance (Feet): 45 Feet Assistive device: Rolling walker (2 wheels) Gait Pattern/deviations: Step-through pattern, Trunk flexed       General Gait Details: no loss of balance using rolling walker for support. mild dyspnea at end of walking bout. cues for pacing for endurance and energy conservation.   Stairs             Wheelchair Mobility     Tilt Bed    Modified Rankin (Stroke Patients Only)       Balance Overall balance assessment: Needs assistance Sitting-balance support: Feet supported Sitting balance-Leahy Scale: Good     Standing balance support: Bilateral upper extremity supported, During functional activity, Reliant on assistive device for balance, No upper extremity supported Standing balance-Leahy Scale: Fair Standing balance comment: patient able to stand without UE support to adjust brief without loss of balance. BUE support of rolling walker required for dynamic activity                            Cognition Arousal: Alert Behavior During Therapy: WFL for tasks assessed/performed Overall Cognitive Status: Within Functional Limits for tasks assessed                                          Exercises      General Comments        Pertinent Vitals/Pain Pain Assessment Pain  Assessment: No/denies pain    Home Living                          Prior Function            PT Goals (current goals can now be found in the care plan section) Acute Rehab PT Goals Patient Stated Goal: to increase independence PT Goal Formulation: With patient Time For Goal Achievement: 07/05/23 Potential to Achieve Goals: Fair Progress towards PT goals: Progressing toward goals    Frequency    Min 1X/week      PT Plan      Co-evaluation              AM-PAC PT "6 Clicks" Mobility   Outcome Measure  Help  needed turning from your back to your side while in a flat bed without using bedrails?: A Little Help needed moving from lying on your back to sitting on the side of a flat bed without using bedrails?: A Little Help needed moving to and from a bed to a chair (including a wheelchair)?: A Little Help needed standing up from a chair using your arms (e.g., wheelchair or bedside chair)?: A Little Help needed to walk in hospital room?: A Little Help needed climbing 3-5 steps with a railing? : A Lot 6 Click Score: 17    End of Session   Activity Tolerance: Patient tolerated treatment well Patient left: in bed;with call bell/phone within reach;with bed alarm set   PT Visit Diagnosis: Other abnormalities of gait and mobility (R26.89);Difficulty in walking, not elsewhere classified (R26.2);Muscle weakness (generalized) (M62.81)     Time: 1610-9604 PT Time Calculation (min) (ACUTE ONLY): 18 min  Charges:    $Therapeutic Activity: 8-22 mins PT General Charges $$ ACUTE PT VISIT: 1 Visit                     Courtney Mack, PT, MPT    Courtney Mack 06/24/2023, 12:14 PM

## 2023-06-24 NOTE — Plan of Care (Signed)

## 2023-06-24 NOTE — Progress Notes (Signed)
PROGRESS NOTE    Courtney Mack  AYT:[id number] DOB: [birth year] DOA: 06/18/2023 PCP: [facility]     Brief Narrative:  Courtney Mack is a 67 year old female with past medical history significant for left-sided hydronephrosis status post nephrostomy tube placement, recurrent UTI and sepsis, hypertension, COPD, history of extensive bilateral PE and DVT status post IVC filter, chronic diastolic CHF who presented to the hospital with shortness of breath and dysuria. Patient was recently hospitalized from 11/18 - 11/21 due to complicated UTI.  Urine culture was positive for pansensitive E. coli. Pt is s/p IR exchange of left nephrostomy tube on 11/19.   Patient was admitted to the hospital with her symptoms of shortness of breath and dysuria.  Patient was empirically treated with IV antibiotics with her history of nephrostomy tube placement.  New events last 24 hours / Subjective: No complaints on exam, denies shortness of breath.  Currently awaiting SNF placement.  Insurance authorization process  Assessment & Plan:   Principal Problem:   SOB (shortness of breath) Active Problems:   Dysuria   COPD (chronic obstructive pulmonary disease) (HCC)   Chronic diastolic CHF (congestive heart failure) (HCC)   Nephrostomy status (HCC)   Hypokalemia   Recurrent deep vein thrombosis (DVT) (HCC)   HTN (hypertension)   IDA (iron deficiency anemia)   Dyslipidemia   Obesity (BMI 30-39.9)   Shortness of breath -Unclear etiology.  CTA chest was unremarkable.  Echocardiogram revealed normal EF, grade 1 diastolic dysfunction.  Resolved, stable on room air  Dysuria -With history of nephrostomy tube placement, which is changed every 2 months -Urine culture showed multiple species -Has had pansensitive E. coli earlier this year -Completed empiric Rocephin  COPD -Stable  Chronic diastolic CHF -Stable.  Lasix.  History of DVT, PE -Status post IVC  filter  Dyslipidemia -Crestor  Obesity -BMI 41.46  Iron deficiency anemia -Ferrous sulfate   DVT prophylaxis:  enoxaparin (LOVENOX) injection 40 mg Start: 06/19/23 1000  Code Status: Full code Family Communication: None at bedside Disposition Plan: SNF placement Status is: Observation   Antimicrobials:  Anti-infectives (From admission, onward)    Start     Dose/Rate Route Frequency Ordered Stop   06/20/23 0045  cefdinir (OMNICEF) capsule 600 mg        600 mg Oral Daily 06/19/23 2351 06/20/23 0104   06/19/23 1915  cefTRIAXone (ROCEPHIN) 1 g in sodium chloride 0.9 % 100 mL IVPB        1 g 200 mL/hr over 30 Minutes Intravenous Every 24 hours 06/19/23 1825 06/22/23 2359   06/18/23 1800  cefTRIAXone (ROCEPHIN) 1 g in sodium chloride 0.9 % 100 mL IVPB        1 g 200 mL/hr over 30 Minutes Intravenous  Once 06/18/23 1748 06/18/23 2040   06/18/23 1800  azithromycin (ZITHROMAX) 500 mg in sodium chloride 0.9 % 250 mL IVPB        500 mg 250 mL/hr over 60 Minutes Intravenous  Once 06/18/23 1748 06/18/23 2210        Objective: Vitals:   06/23/23 0811 06/23/23 1633 06/23/23 2222 06/24/23 0801  BP: 107/65 (!) 115/54 (!) 89/39 (!) 111/59  Pulse: 93 96 89 92  Resp: 16 18 20 15   Temp: 98.2 F (36.8 C) 98.6 F (37 C) 98.4 F (36.9 C) 98.3 F (36.8 C)  TempSrc:  Oral  Oral  SpO2: 97% 99% 98% 93%  Weight:      Height:  Intake/Output Summary (Last 24 hours) at 06/24/2023 1104 Last data filed at 06/24/2023 1035 Gross per 24 hour  Intake 960 ml  Output 975 ml  Net -15 ml   Filed Weights   06/21/23 0452 06/22/23 0500 06/23/23 0444  Weight: 87 kg 87 kg 93.1 kg    Examination:  General exam: Appears calm and comfortable  Respiratory system: Clear to auscultation. Respiratory effort normal. No respiratory distress. No conversational dyspnea.  Cardiovascular system: S1 & S2 heard, RRR. No murmurs. No pedal edema. Gastrointestinal system: Abdomen is nondistended, soft  and nontender. Normal bowel sounds heard. Central nervous system: Alert and oriented. No focal neurological deficits. Speech clear.  Extremities: Symmetric in appearance  Skin: No rashes, lesions or ulcers on exposed skin  Psychiatry: Judgement and insight appear normal. Mood & affect appropriate.   Data Reviewed: I have personally reviewed following labs and imaging studies  CBC: Recent Labs  Lab 06/18/23 1235 06/19/23 0359 06/21/23 0257 06/23/23 0251  WBC 4.6 4.1 4.3 5.0  HGB 8.6* 7.9* 8.8* 8.2*  HCT 28.2* 25.9* 28.8* 27.1*  MCV 94.3 92.8 93.2 93.1  PLT 211 221 253 229   Basic Metabolic Panel: Recent Labs  Lab 06/18/23 1235 06/18/23 1905 06/19/23 0359 06/23/23 0251  NA 137  --  140 137  K 3.1*  --  3.5 3.7  CL 104  --  110 105  CO2 24  --  24 27  GLUCOSE 123*  --  94 160*  BUN 28*  --  23 22  CREATININE 1.13*  --  0.68 0.83  CALCIUM 8.5*  --  8.3* 8.5*  MG  --  2.2  --   --    GFR: Estimated Creatinine Clearance: 64.7 mL/min (by C-G formula based on SCr of 0.83 mg/dL). Liver Function Tests: Recent Labs  Lab 06/23/23 0251  AST 17  ALT 10  ALKPHOS 45  BILITOT 0.5  PROT 5.9*  ALBUMIN 2.8*   No results for input(s): "LIPASE", "AMYLASE" in the last 168 hours. No results for input(s): "AMMONIA" in the last 168 hours. Coagulation Profile: No results for input(s): "INR", "PROTIME" in the last 168 hours. Cardiac Enzymes: No results for input(s): "CKTOTAL", "CKMB", "CKMBINDEX", "TROPONINI" in the last 168 hours. BNP (last 3 results) No results for input(s): "PROBNP" in the last 8760 hours. HbA1C: No results for input(s): "HGBA1C" in the last 72 hours. CBG: No results for input(s): "GLUCAP" in the last 168 hours. Lipid Profile: No results for input(s): "CHOL", "HDL", "LDLCALC", "TRIG", "CHOLHDL", "LDLDIRECT" in the last 72 hours. Thyroid Function Tests: No results for input(s): "TSH", "T4TOTAL", "FREET4", "T3FREE", "THYROIDAB" in the last 72 hours. Anemia  Panel: No results for input(s): "VITAMINB12", "FOLATE", "FERRITIN", "TIBC", "IRON", "RETICCTPCT" in the last 72 hours. Sepsis Labs: Recent Labs  Lab 06/18/23 1235 06/18/23 1746 06/18/23 1853  PROCALCITON <0.10  --   --   LATICACIDVEN  --  0.9 1.0    Recent Results (from the past 240 hours)  Resp panel by RT-PCR (RSV, Flu A&B, Covid) Anterior Nasal Swab     Status: None   Collection Time: 06/18/23  5:19 PM   Specimen: Anterior Nasal Swab  Result Value Ref Range Status   SARS Coronavirus 2 by RT PCR NEGATIVE NEGATIVE Final    Comment: (NOTE) SARS-CoV-2 target nucleic acids are NOT DETECTED.  The SARS-CoV-2 RNA is generally detectable in upper respiratory specimens during the acute phase of infection. The lowest concentration of SARS-CoV-2 viral copies this assay can detect  is 138 copies/mL. A negative result does not preclude SARS-Cov-2 infection and should not be used as the sole basis for treatment or other patient management decisions. A negative result may occur with  improper specimen collection/handling, submission of specimen other than nasopharyngeal swab, presence of viral mutation(s) within the areas targeted by this assay, and inadequate number of viral copies(<138 copies/mL). A negative result must be combined with clinical observations, patient history, and epidemiological information. The expected result is Negative.  Fact Sheet for Patients:  BloggerCourse.com  Fact Sheet for Healthcare Providers:  SeriousBroker.it  This test is no t yet approved or cleared by the [place] FDA and  has been authorized for detection and/or diagnosis of SARS-CoV-2 by FDA under an Emergency Use Authorization (EUA). This EUA will remain  in effect (meaning this test can be used) for the duration of the COVID-19 declaration under Section 564(b)(1) of the Act, 21 U.S.C.section 360bbb-3(b)(1), unless the authorization is  terminated  or revoked sooner.       Influenza A by PCR NEGATIVE NEGATIVE Final   Influenza B by PCR NEGATIVE NEGATIVE Final    Comment: (NOTE) The Xpert Xpress SARS-CoV-2/FLU/RSV plus assay is intended as an aid in the diagnosis of influenza from Nasopharyngeal swab specimens and should not be used as a sole basis for treatment. Nasal washings and aspirates are unacceptable for Xpert Xpress SARS-CoV-2/FLU/RSV testing.  Fact Sheet for Patients: BloggerCourse.com  Fact Sheet for Healthcare Providers: SeriousBroker.it  This test is not yet approved or cleared by the [place] FDA and has been authorized for detection and/or diagnosis of SARS-CoV-2 by FDA under an Emergency Use Authorization (EUA). This EUA will remain in effect (meaning this test can be used) for the duration of the COVID-19 declaration under Section 564(b)(1) of the Act, 21 U.S.C. section 360bbb-3(b)(1), unless the authorization is terminated or revoked.     Resp Syncytial Virus by PCR NEGATIVE NEGATIVE Final    Comment: (NOTE) Fact Sheet for Patients: BloggerCourse.com  Fact Sheet for Healthcare Providers: SeriousBroker.it  This test is not yet approved or cleared by the [place] FDA and has been authorized for detection and/or diagnosis of SARS-CoV-2 by FDA under an Emergency Use Authorization (EUA). This EUA will remain in effect (meaning this test can be used) for the duration of the COVID-19 declaration under Section 564(b)(1) of the Act, 21 U.S.C. section 360bbb-3(b)(1), unless the authorization is terminated or revoked.  Performed at [facility], [address] [place], [place] [address]   Blood Culture (routine x 2)     Status: None   Collection Time: 06/18/23  6:56 PM   Specimen: BLOOD  Result Value Ref Range Status   Specimen Description BLOOD BLOOD RIGHT ARM  Final    Special Requests   Final    BOTTLES DRAWN AEROBIC AND ANAEROBIC Blood Culture adequate volume   Culture   Final    NO GROWTH 5 DAYS Performed at [facility], [address] [place], [place] [address]    Report Status 06/23/2023 FINAL  Final  Blood Culture (routine x 2)     Status: None   Collection Time: 06/18/23  7:05 PM   Specimen: BLOOD  Result Value Ref Range Status   Specimen Description BLOOD BLOOD LEFT ARM  Final   Special Requests   Final    BOTTLES DRAWN AEROBIC AND ANAEROBIC Blood Culture adequate volume   Culture   Final    NO GROWTH 5 DAYS Performed at [facility], [address] [place], [place] [address]    Report Status 06/23/2023 FINAL  Final  Urine Culture (for pregnant, neutropenic or urologic patients or patients with an indwelling urinary catheter)     Status: Abnormal   Collection Time: 06/19/23  6:28 PM   Specimen: Urine, Clean Catch  Result Value Ref Range Status   Specimen Description   Final    URINE, CLEAN CATCH Performed at [facility], [address] [place], [place] [address]    Special Requests   Final    NONE Performed at [facility], [address] [place], [place] [address]    Culture MULTIPLE SPECIES PRESENT, SUGGEST RECOLLECTION (A)  Final   Report Status 06/20/2023 FINAL  Final  MRSA Next Gen by PCR, Nasal     Status: None   Collection Time: 06/23/23  6:37 AM   Specimen: Nasal Mucosa; Nasal Swab  Result Value Ref Range Status   MRSA by PCR Next Gen NOT DETECTED NOT DETECTED Final    Comment: (NOTE) The GeneXpert MRSA Assay (FDA approved for NASAL specimens only), is one component of a comprehensive MRSA colonization surveillance program. It is not intended to diagnose MRSA infection nor to guide or monitor treatment for MRSA infections. Test performance is not FDA approved in patients less than 39 years old. Performed at [facility], [address] [place], [place] [address]        Radiology Studies: No results found.    Scheduled Meds:  vitamin C  1,000 mg Oral Daily   Chlorhexidine Gluconate Cloth  6 each Topical Daily   enoxaparin (LOVENOX) injection  40 mg Subcutaneous Q24H   ferrous sulfate  325 mg Oral Q breakfast   furosemide  20 mg Oral Daily   mometasone-formoterol  2 puff Inhalation BID   multivitamin with minerals  1 tablet Oral Daily   pantoprazole  40 mg Oral BID   rosuvastatin  10 mg Oral Daily   Continuous Infusions:   LOS: 0 days   Time spent: 20 minutes   Noralee Stain, DO [facility] 06/24/2023, 11:04 AM   Available via Epic secure chat 7am-7pm After these hours, please refer to coverage provider listed on [email or web address]

## 2023-06-25 DIAGNOSIS — R0602 Shortness of breath: Secondary | ICD-10-CM | POA: Diagnosis not present

## 2023-06-25 NOTE — Progress Notes (Signed)
Second attempt to call report goes unanswered. Will try again.

## 2023-06-25 NOTE — Plan of Care (Signed)

## 2023-06-25 NOTE — Discharge Summary (Signed)
Physician Discharge Summary  Courtney Mack YNW:[id number] DOB: Sep 19, 1955 DOA: 06/18/2023  PCP: [facility]  Admit date: 06/18/2023 Discharge date: 06/25/2023  Admitted From: Home Disposition:  SNF   Recommendations for Outpatient Follow-up:  Follow up with PCP  Discharge Condition: Stable CODE STATUS: Full code Diet recommendation: Heart healthy diet  Brief/Interim Summary: Courtney Mack is a 67 year old female with past medical history significant for left-sided hydronephrosis status post nephrostomy tube placement, recurrent UTI and sepsis, hypertension, COPD, history of extensive bilateral PE and DVT status post IVC filter, chronic diastolic CHF who presented to the hospital with shortness of breath and dysuria. Patient was recently hospitalized from 11/18 - 11/21 due to complicated UTI.  Urine culture was positive for pansensitive E. coli. Pt is s/p IR exchange of left nephrostomy tube on 11/19.    Patient was admitted to the hospital with her symptoms of shortness of breath and dysuria.  Patient was empirically treated with IV antibiotics with her history of nephrostomy tube placement.  Discharge Diagnoses:   Principal Problem:   SOB (shortness of breath) Active Problems:   Dysuria   COPD (chronic obstructive pulmonary disease) (HCC)   Chronic diastolic CHF (congestive heart failure) (HCC)   Nephrostomy status (HCC)   Hypokalemia   Recurrent deep vein thrombosis (DVT) (HCC)   HTN (hypertension)   IDA (iron deficiency anemia)   Dyslipidemia   Obesity (BMI 30-39.9)  Shortness of breath -Unclear etiology.  CTA chest was unremarkable.  Echocardiogram revealed normal EF, grade 1 diastolic dysfunction.  Resolved, stable on room air   Dysuria -With history of nephrostomy tube placement, which is changed every 2 months -Urine culture showed multiple species -Has had pansensitive E. coli earlier this year -Completed empiric Rocephin    COPD -Stable   Chronic diastolic CHF -Stable.  Lasix.   History of DVT, PE -Status post IVC filter   Dyslipidemia -Crestor   Obesity -BMI 41.46   Iron deficiency anemia -Ferrous sulfate  Discharge Instructions  Discharge Instructions     Diet - low sodium heart healthy   Complete by: As directed    Increase activity slowly   Complete by: As directed       Allergies as of 06/25/2023   No Known Allergies      Medication List     TAKE these medications    acetaminophen 500 MG tablet Commonly known as: TYLENOL Take 500 mg by mouth every 6 (six) hours as needed for mild pain or fever.   albuterol 108 (90 Base) MCG/ACT inhaler Commonly known as: VENTOLIN HFA Inhale 2 puffs into the lungs every 6 (six) hours as needed for wheezing or shortness of breath. What changed: when to take this   Breo Ellipta 100-25 MCG/ACT Aepb Generic drug: fluticasone furoate-vilanterol Inhale 1 puff into the lungs daily.   cyanocobalamin 100 MCG tablet Take 100 mcg by mouth daily.   FeroSul 325 (65 Fe) MG tablet Generic drug: ferrous sulfate Take 325 mg by mouth daily with breakfast.   furosemide 20 MG tablet Commonly known as: LASIX Take 20 mg by mouth daily.   multivitamin with minerals Tabs tablet Take 1 tablet by mouth daily.   pantoprazole 40 MG tablet Commonly known as: PROTONIX Take 1 tablet (40 mg total) by mouth 2 (two) times daily.   rosuvastatin 10 MG tablet Commonly known as: CRESTOR Take 1 tablet (10 mg total) by mouth daily.   simethicone 80 MG chewable tablet Commonly known as: Marney Doctor  1 tablet (80 mg total) by mouth every 6 (six) hours as needed for flatulence.   vitamin C 1000 MG tablet Take 1,000 mg by mouth daily.        Follow-up Information     [facility] Follow up.   Specialty: General Practice Contact information: [address] [place] [address] [phone]                 No Known Allergies    Procedures/Studies: CT Angio Chest PE W/Cm &/Or Wo Cm Result Date: 06/18/2023 CLINICAL DATA:  Increasing shortness of breath EXAM: CT ANGIOGRAPHY CHEST WITH CONTRAST TECHNIQUE: Multidetector CT imaging of the chest was performed using the standard protocol during bolus administration of intravenous contrast. Multiplanar CT image reconstructions and MIPs were obtained to evaluate the vascular anatomy. RADIATION DOSE REDUCTION: This exam was performed according to the departmental dose-optimization program which includes automated exposure control, adjustment of the mA and/or kV according to patient size and/or use of iterative reconstruction technique. CONTRAST:  75mL OMNIPAQUE IOHEXOL 350 MG/ML SOLN COMPARISON:  X-ray from earlier in the same day. FINDINGS: Cardiovascular: Atherosclerotic calcifications of the aorta are noted. No aneurysmal dilatation is seen. Heart is mildly enlarged in size. Pulmonary artery shows a normal branching pattern without intraluminal filling defect to suggest pulmonary embolism. No significant coronary calcifications are noted. Mediastinum/Nodes: Thoracic inlet is within normal limits. No hilar or mediastinal adenopathy is noted. The esophagus is within normal limits. Lungs/Pleura: Mosaic attenuation is noted throughout both lungs consistent with air trapping. Minimal basilar atelectasis is seen. No focal confluent infiltrate is noted. Upper Abdomen: Visualized upper abdomen is unremarkable. Musculoskeletal: Degenerative changes of the thoracic spine are seen. Review of the MIP images confirms the above findings. IMPRESSION: No evidence of pulmonary emboli. Changes of mild air trapping. Aortic Atherosclerosis (ICD10-I70.0). Electronically Signed   By: Alcide Clever M.D.   On: 06/18/2023 20:55   DG Chest 2 View Result Date: 06/18/2023 CLINICAL DATA:  Increased shortness of breath EXAM: CHEST - 2 VIEW COMPARISON:  Chest radiograph dated 02/10/2023  FINDINGS: Normal lung volumes. Left basilar patchy opacity. No pleural effusion or pneumothorax. The heart size and mediastinal contours are within normal limits. No acute osseous abnormality. IMPRESSION: Left basilar patchy opacity, likely atelectasis. Aspiration or pneumonia can be considered in the appropriate clinical setting. Electronically Signed   By: Agustin Cree M.D.   On: 06/18/2023 13:47   IR NEPHROSTOMY EXCHANGE LEFT Result Date: 05/27/2023 INDICATION: Urinary tract infection and poor drainage from indwelling chronic left percutaneous nephrostomy tube. The tube was last exchanged on 04/06/2023. EXAM: LEFT PERCUTANEOUS NEPHROSTOMY TUBE EXCHANGE UNDER FLUOROSCOPY COMPARISON:  None Available. MEDICATIONS: None ANESTHESIA/SEDATION: None CONTRAST:  7 mL Omnipaque 300-administered into the collecting system(s) FLUOROSCOPY: Radiation Exposure Index (as provided by the fluoroscopic device): 4.5 mGy Kerma COMPLICATIONS: None immediate. PROCEDURE: Informed written consent was obtained from the patient after a thorough discussion of the procedural risks, benefits and alternatives. All questions were addressed. Maximal Sterile Barrier Technique was utilized including caps, mask, sterile gowns, sterile gloves, sterile drape, hand hygiene and skin antiseptic. A timeout was performed prior to the initiation of the procedure. A pre-existing 10 French percutaneous nephrostomy tube was injected with contrast, cut and removed over a guidewire. A new 10 French catheter was advanced over the wire and formed in the left renal pelvis. Final catheter position was confirmed by fluoroscopy after contrast injection. The catheter was attached to a new gravity drainage bag and  secured at the skin with an adhesive retention device. IMPRESSION: Exchange of left 10 French percutaneous nephrostomy tube under fluoroscopy. Electronically Signed   By: Irish Lack M.D.   On: 05/27/2023 12:34       Discharge Exam: Vitals:    06/25/23 0013 06/25/23 0842  BP: (!) 126/51 111/62  Pulse: 85 84  Resp: 18 18  Temp: 98.1 F (36.7 C) 98.1 F (36.7 C)  SpO2: 99% 98%    General: Pt is alert, awake, not in acute distress Cardiovascular: RRR, S1/S2 +, no edema Respiratory: CTA bilaterally, no wheezing, no rhonchi, no respiratory distress, no conversational dyspnea, on room air  Abdominal: Soft, NT, ND, bowel sounds + Extremities: no edema, no cyanosis Psych: Normal mood and affect, stable judgement and insight     The results of significant diagnostics from this hospitalization (including imaging, microbiology, ancillary and laboratory) are listed below for reference.     Microbiology: Recent Results (from the past 240 hours)  Resp panel by RT-PCR (RSV, Flu A&B, Covid) Anterior Nasal Swab     Status: None   Collection Time: 06/18/23  5:19 PM   Specimen: Anterior Nasal Swab  Result Value Ref Range Status   SARS Coronavirus 2 by RT PCR NEGATIVE NEGATIVE Final    Comment: (NOTE) SARS-CoV-2 target nucleic acids are NOT DETECTED.  The SARS-CoV-2 RNA is generally detectable in upper respiratory specimens during the acute phase of infection. The lowest concentration of SARS-CoV-2 viral copies this assay can detect is 138 copies/mL. A negative result does not preclude SARS-Cov-2 infection and should not be used as the sole basis for treatment or other patient management decisions. A negative result may occur with  improper specimen collection/handling, submission of specimen other than nasopharyngeal swab, presence of viral mutation(s) within the areas targeted by this assay, and inadequate number of viral copies(<138 copies/mL). A negative result must be combined with clinical observations, patient history, and epidemiological information. The expected result is Negative.  Fact Sheet for Patients:  BloggerCourse.com  Fact Sheet for Healthcare Providers:   SeriousBroker.it  This test is no t yet approved or cleared by the [place] FDA and  has been authorized for detection and/or diagnosis of SARS-CoV-2 by FDA under an Emergency Use Authorization (EUA). This EUA will remain  in effect (meaning this test can be used) for the duration of the COVID-19 declaration under Section 564(b)(1) of the Act, 21 U.S.C.section 360bbb-3(b)(1), unless the authorization is terminated  or revoked sooner.       Influenza A by PCR NEGATIVE NEGATIVE Final   Influenza B by PCR NEGATIVE NEGATIVE Final    Comment: (NOTE) The Xpert Xpress SARS-CoV-2/FLU/RSV plus assay is intended as an aid in the diagnosis of influenza from Nasopharyngeal swab specimens and should not be used as a sole basis for treatment. Nasal washings and aspirates are unacceptable for Xpert Xpress SARS-CoV-2/FLU/RSV testing.  Fact Sheet for Patients: BloggerCourse.com  Fact Sheet for Healthcare Providers: SeriousBroker.it  This test is not yet approved or cleared by the [place] FDA and has been authorized for detection and/or diagnosis of SARS-CoV-2 by FDA under an Emergency Use Authorization (EUA). This EUA will remain in effect (meaning this test can be used) for the duration of the COVID-19 declaration under Section 564(b)(1) of the Act, 21 U.S.C. section 360bbb-3(b)(1), unless the authorization is terminated or revoked.     Resp Syncytial Virus by PCR NEGATIVE NEGATIVE Final    Comment: (NOTE) Fact Sheet for Patients: BloggerCourse.com  Fact  Sheet for Healthcare Providers: SeriousBroker.it  This test is not yet approved or cleared by the [place] and has been authorized for detection and/or diagnosis of SARS-CoV-2 by FDA under an Emergency Use Authorization (EUA). This EUA will remain in effect (meaning this test can be used) for  the duration of the COVID-19 declaration under Section 564(b)(1) of the Act, 21 U.S.C. section 360bbb-3(b)(1), unless the authorization is terminated or revoked.  Performed at [facility], [address] [place], [place] [address]   Blood Culture (routine x 2)     Status: None   Collection Time: 06/18/23  6:56 PM   Specimen: BLOOD  Result Value Ref Range Status   Specimen Description BLOOD BLOOD RIGHT ARM  Final   Special Requests   Final    BOTTLES DRAWN AEROBIC AND ANAEROBIC Blood Culture adequate volume   Culture   Final    NO GROWTH 5 DAYS Performed at [facility], [address] [place], [place] [address]    Report Status 06/23/2023 FINAL  Final  Blood Culture (routine x 2)     Status: None   Collection Time: 06/18/23  7:05 PM   Specimen: BLOOD  Result Value Ref Range Status   Specimen Description BLOOD BLOOD LEFT ARM  Final   Special Requests   Final    BOTTLES DRAWN AEROBIC AND ANAEROBIC Blood Culture adequate volume   Culture   Final    NO GROWTH 5 DAYS Performed at [facility], [address] [place], [place] [address]    Report Status 06/23/2023 FINAL  Final  Urine Culture (for pregnant, neutropenic or urologic patients or patients with an indwelling urinary catheter)     Status: Abnormal   Collection Time: 06/19/23  6:28 PM   Specimen: Urine, Clean Catch  Result Value Ref Range Status   Specimen Description   Final    URINE, CLEAN CATCH Performed at [facility], [address] [place], [place] [address]    Special Requests   Final    NONE Performed at [facility], [address] [place], [place] [address]    Culture MULTIPLE SPECIES PRESENT, SUGGEST RECOLLECTION (A)  Final   Report Status 06/20/2023 FINAL  Final  MRSA Next Gen by PCR, Nasal     Status: None   Collection Time: 06/23/23  6:37 AM   Specimen: Nasal Mucosa; Nasal Swab  Result Value Ref Range Status   MRSA by PCR Next Gen NOT DETECTED  NOT DETECTED Final    Comment: (NOTE) The GeneXpert MRSA Assay (FDA approved for NASAL specimens only), is one component of a comprehensive MRSA colonization surveillance program. It is not intended to diagnose MRSA infection nor to guide or monitor treatment for MRSA infections. Test performance is not FDA approved in patients less than 69 years old. Performed at [facility], [address] [place], [place] [address]      Labs: BNP (last 3 results) Recent Labs    11/20/22 2141 05/26/23 1804 06/18/23 1853  BNP 41.4 47.0 32.2   Basic Metabolic Panel: Recent Labs  Lab 06/18/23 1235 06/18/23 1905 06/19/23 0359 06/23/23 0251  NA 137  --  140 137  K 3.1*  --  3.5 3.7  CL 104  --  110 105  CO2 24  --  24 27  GLUCOSE 123*  --  94 160*  BUN 28*  --  23 22  CREATININE 1.13*  --  0.68 0.83  CALCIUM  8.5*  --  8.3* 8.5*  MG  --  2.2  --   --    Liver Function Tests: Recent Labs  Lab 06/23/23 0251  AST 17  ALT 10  ALKPHOS 45  BILITOT 0.5  PROT 5.9*  ALBUMIN 2.8*   No results for input(s): "LIPASE", "AMYLASE" in the last 168 hours. No results for input(s): "AMMONIA" in the last 168 hours. CBC: Recent Labs  Lab 06/18/23 1235 06/19/23 0359 06/21/23 0257 06/23/23 0251  WBC 4.6 4.1 4.3 5.0  HGB 8.6* 7.9* 8.8* 8.2*  HCT 28.2* 25.9* 28.8* 27.1*  MCV 94.3 92.8 93.2 93.1  PLT 211 221 253 229   Cardiac Enzymes: No results for input(s): "CKTOTAL", "CKMB", "CKMBINDEX", "TROPONINI" in the last 168 hours. BNP: Invalid input(s): "POCBNP" CBG: No results for input(s): "GLUCAP" in the last 168 hours. D-Dimer No results for input(s): "DDIMER" in the last 72 hours. Hgb A1c No results for input(s): "HGBA1C" in the last 72 hours. Lipid Profile No results for input(s): "CHOL", "HDL", "LDLCALC", "TRIG", "CHOLHDL", "LDLDIRECT" in the last 72 hours. Thyroid function studies No results for input(s): "TSH", "T4TOTAL", "T3FREE", "THYROIDAB" in the last 72  hours.  Invalid input(s): "FREET3" Anemia work up No results for input(s): "VITAMINB12", "FOLATE", "FERRITIN", "TIBC", "IRON", "RETICCTPCT" in the last 72 hours. Urinalysis    Component Value Date/Time   COLORURINE YELLOW (A) 06/19/2023 1305   APPEARANCEUR CLOUDY (A) 06/19/2023 1305   APPEARANCEUR Cloudy (A) 01/03/2022 1443   LABSPEC 1.040 (H) 06/19/2023 1305   PHURINE 5.0 06/19/2023 1305   GLUCOSEU NEGATIVE 06/19/2023 1305   HGBUR NEGATIVE 06/19/2023 1305   BILIRUBINUR NEGATIVE 06/19/2023 1305   BILIRUBINUR Negative 01/03/2022 1443   KETONESUR NEGATIVE 06/19/2023 1305   PROTEINUR NEGATIVE 06/19/2023 1305   NITRITE NEGATIVE 06/19/2023 1305   LEUKOCYTESUR MODERATE (A) 06/19/2023 1305   Sepsis Labs Recent Labs  Lab 06/18/23 1235 06/19/23 0359 06/21/23 0257 06/23/23 0251  WBC 4.6 4.1 4.3 5.0   Microbiology Recent Results (from the past 240 hours)  Resp panel by RT-PCR (RSV, Flu A&B, Covid) Anterior Nasal Swab     Status: None   Collection Time: 06/18/23  5:19 PM   Specimen: Anterior Nasal Swab  Result Value Ref Range Status   SARS Coronavirus 2 by RT PCR NEGATIVE NEGATIVE Final    Comment: (NOTE) SARS-CoV-2 target nucleic acids are NOT DETECTED.  The SARS-CoV-2 RNA is generally detectable in upper respiratory specimens during the acute phase of infection. The lowest concentration of SARS-CoV-2 viral copies this assay can detect is 138 copies/mL. A negative result does not preclude SARS-Cov-2 infection and should not be used as the sole basis for treatment or other patient management decisions. A negative result may occur with  improper specimen collection/handling, submission of specimen other than nasopharyngeal swab, presence of viral mutation(s) within the areas targeted by this assay, and inadequate number of viral copies(<138 copies/mL). A negative result must be combined with clinical observations, patient history, and epidemiological information. The expected  result is Negative.  Fact Sheet for Patients:  BloggerCourse.com  Fact Sheet for Healthcare Providers:  SeriousBroker.it  This test is no t yet approved or cleared by the [place] FDA and  has been authorized for detection and/or diagnosis of SARS-CoV-2 by FDA under an Emergency Use Authorization (EUA). This EUA will remain  in effect (meaning this test can be used) for the duration of the COVID-19 declaration under Section 564(b)(1) of the Act, 21 U.S.C.section 360bbb-3(b)(1), unless the authorization is terminated  or revoked sooner.       Influenza A by PCR NEGATIVE NEGATIVE Final   Influenza B by PCR NEGATIVE NEGATIVE Final    Comment: (NOTE) The Xpert Xpress SARS-CoV-2/FLU/RSV plus assay is intended as an aid in the diagnosis of influenza from Nasopharyngeal swab specimens and should not be used as a sole basis for treatment. Nasal washings and aspirates are unacceptable for Xpert Xpress SARS-CoV-2/FLU/RSV testing.  Fact Sheet for Patients: BloggerCourse.com  Fact Sheet for Healthcare Providers: SeriousBroker.it  This test is not yet approved or cleared by the [place] FDA and has been authorized for detection and/or diagnosis of SARS-CoV-2 by FDA under an Emergency Use Authorization (EUA). This EUA will remain in effect (meaning this test can be used) for the duration of the COVID-19 declaration under Section 564(b)(1) of the Act, 21 U.S.C. section 360bbb-3(b)(1), unless the authorization is terminated or revoked.     Resp Syncytial Virus by PCR NEGATIVE NEGATIVE Final    Comment: (NOTE) Fact Sheet for Patients: BloggerCourse.com  Fact Sheet for Healthcare Providers: SeriousBroker.it  This test is not yet approved or cleared by the [place] FDA and has been authorized for detection and/or diagnosis of  SARS-CoV-2 by FDA under an Emergency Use Authorization (EUA). This EUA will remain in effect (meaning this test can be used) for the duration of the COVID-19 declaration under Section 564(b)(1) of the Act, 21 U.S.C. section 360bbb-3(b)(1), unless the authorization is terminated or revoked.  Performed at [facility], [address] [place], [place] [address]   Blood Culture (routine x 2)     Status: None   Collection Time: 06/18/23  6:56 PM   Specimen: BLOOD  Result Value Ref Range Status   Specimen Description BLOOD BLOOD RIGHT ARM  Final   Special Requests   Final    BOTTLES DRAWN AEROBIC AND ANAEROBIC Blood Culture adequate volume   Culture   Final    NO GROWTH 5 DAYS Performed at [facility], [address], [place], [place] [address]    Report Status 06/23/2023 FINAL  Final  Blood Culture (routine x 2)     Status: None   Collection Time: 06/18/23  7:05 PM   Specimen: BLOOD  Result Value Ref Range Status   Specimen Description BLOOD BLOOD LEFT ARM  Final   Special Requests   Final    BOTTLES DRAWN AEROBIC AND ANAEROBIC Blood Culture adequate volume   Culture   Final    NO GROWTH 5 DAYS Performed at [facility], [address], [place], [place] [address]    Report Status 06/23/2023 FINAL  Final  Urine Culture (for pregnant, neutropenic or urologic patients or patients with an indwelling urinary catheter)     Status: Abnormal   Collection Time: 06/19/23  6:28 PM   Specimen: Urine, Clean Catch  Result Value Ref Range Status   Specimen Description   Final    URINE, CLEAN CATCH Performed at [facility], [address] [place], [place] [address]    Special Requests   Final    NONE Performed at [facility], [address] [place], [place] [address]    Culture MULTIPLE SPECIES PRESENT, SUGGEST RECOLLECTION (A)  Final   Report Status 06/20/2023 FINAL  Final  MRSA Next Gen by PCR, Nasal     Status: None   Collection  Time: 06/23/23  6:37 AM   Specimen: Nasal Mucosa; Nasal Swab  Result Value Ref Range Status   MRSA  by PCR Next Gen NOT DETECTED NOT DETECTED Final    Comment: (NOTE) The GeneXpert MRSA Assay (FDA approved for NASAL specimens only), is one component of a comprehensive MRSA colonization surveillance program. It is not intended to diagnose MRSA infection nor to guide or monitor treatment for MRSA infections. Test performance is not FDA approved in patients less than 75 years old. Performed at [facility], [address] [place], [place] [address]      Patient was seen and examined on the day of discharge and was found to be in stable condition. Time coordinating discharge: 35 minutes including assessment and coordination of care, as well as examination of the patient.   SIGNED:  Noralee Stain, DO [facility] 06/25/2023, 10:28 AM

## 2023-06-25 NOTE — Progress Notes (Signed)
Attempted to call report to [employer], nurse was transferred Runner, broadcasting/film/video but no answer. Nurse was cut off, attempting again.

## 2023-06-25 NOTE — TOC Transition Note (Signed)
Transition of Care [facility]) - Discharge Note   Patient Details  Name: NATASHIA DEININGER MRN: [id number] Date of Birth: 02-Sep-1955  Transition of Care [facility]) CM/SW Contact:  Garret Reddish, RN Phone Number: 06/25/2023, 11:27 AM   Clinical Narrative:    Chart reviewed.  Noted that patient has orders for discharge today.  SNF authorization has been approved.  Plan auth ID- [id number] Auth ID- [id number].  Patient approved for 06-24-23- 06-26-2023 and next review date is for 06-26-23.    Dorathy Daft, Admission Coordinator for [facility] reports that she will have a bed for patient today.  She informs me that patient will go to room 608 and the number to call report is [phone].  I have sent [facility] patient's Discharge Summary, Discharge orders, and SNF Transfer report.    I have informed Mrs. Dillen that [facility] will have a bed for her today.  I have informed Mrs. Cela that [facility] will transport her to [facility] today.    I have arranged [facility] to transport patient to the facility today.    I have informed staff nurse of the above information.     Final next level of care: Skilled Nursing Facility Barriers to Discharge: No Barriers Identified   Patient Goals and CMS Choice Patient states their goals for this hospitalization and ongoing recovery are:: SNF CMS Medicare.gov Compare Post Acute Care list provided to:: Patient Choice offered to / list presented to : Patient      Discharge Placement              Patient [facility] bed at: [facility] Patient to be transferred to facility by: [facility] Name of family member notified: Patient reports that she will notify family Patient and family notified of of transfer: 06/25/23  Discharge Plan and Services Additional resources added to the After Visit Summary for     Discharge Planning Services: CM Consult              DME Agency: NA                   Social Drivers of Health (SDOH) Interventions SDOH Screenings   Food Insecurity: No Food Insecurity (06/19/2023)  Housing: Unknown (06/19/2023)  Transportation Needs: No Transportation Needs (06/19/2023)  Utilities: Not At Risk (06/19/2023)  Depression (PHQ2-9): Low Risk  (04/11/2022)  Tobacco Use: Low Risk  (06/18/2023)     Readmission Risk Interventions    05/29/2023    9:42 AM 05/27/2023   10:59 AM 04/07/2023   10:32 AM  Readmission Risk Prevention Plan  Transportation Screening  Complete Complete  PCP or Specialist Appt within 3-5 Days   Complete  Social Work Consult for Recovery Care Planning/Counseling  Complete   Palliative Care Screening  Not Applicable Not Applicable  Medication Review Ocean[facility])  Complete Complete  HRI or Home Care Consult Patient refused    SW Recovery Care/Counseling Consult Complete    Palliative Care Screening Not Applicable    Skilled Nursing Facility Not Applicable

## 2023-06-25 NOTE — Plan of Care (Signed)

## 2023-06-25 NOTE — Progress Notes (Signed)
[employer] once more, operator took this nurse's number and said she would pass on message for the nurse to call for report.

## 2023-07-07 ENCOUNTER — Ambulatory Visit (INDEPENDENT_AMBULATORY_CARE_PROVIDER_SITE_OTHER): Payer: Medicare HMO | Admitting: Urology

## 2023-07-07 VITALS — BP 128/70 | HR 68 | Ht 59.0 in | Wt 195.0 lb

## 2023-07-07 DIAGNOSIS — N1330 Unspecified hydronephrosis: Secondary | ICD-10-CM

## 2023-07-07 DIAGNOSIS — N390 Urinary tract infection, site not specified: Secondary | ICD-10-CM

## 2023-07-07 DIAGNOSIS — Z87448 Personal history of other diseases of urinary system: Secondary | ICD-10-CM

## 2023-07-07 DIAGNOSIS — Z87440 Personal history of urinary (tract) infections: Secondary | ICD-10-CM | POA: Diagnosis not present

## 2023-07-07 DIAGNOSIS — Z436 Encounter for attention to other artificial openings of urinary tract: Secondary | ICD-10-CM

## 2023-07-07 NOTE — Progress Notes (Signed)
I, Courtney Mack, acting as a scribe for Courtney Altes, MD., have documented all relevant documentation on the behalf of Courtney Altes, MD, as directed by Courtney Altes, MD while in the presence of Courtney Altes, MD.  07/07/2023 1:32 PM   Courtney Mack [birth year] [id number]  Referring provider: Center, Courtney Mack [facility] [address] [place],  [place] [address]  Chief Complaint  Patient presents with   Other    HPI: Courtney Mack is a 67 y.o. female presents to discuss nephrostomy tube.  Has a complicated urologic history. Presented initially March 2023 with septic shock with a left hydronephrosis with a 2mm left-distal ureteral calculus. She could not be placed in a lithotomy position secondary to BMI and inability to flex knees and hips. A ureteral stent was able to be placed in the supine position using a flexible scope with some difficulty.  Subsequently underwent left ureteroscopy 10/16/2021. The stone was not present and her ureteral stent was removed. She presented the following day with persistent left hydronephrosis and sepsis with septic shock and underwent replacement of her ureteral stent.  Subsequently converted to a left nephrostomy tube secondary to recurrent gross hematuria. She was on anticoagulant therapy and it was felt her hematuria was related to indwelling stent effects on the bladder. Her stent was removed and nephrostomy tube was placed. She currently remains with an indwelling nephrostomy tube. She was hospitalized for sepsis from her UTI/sepsis from a urinary source in mid-December 2024 and late November 2024. She was also admitted for sepsis October 2024 after a nephrostomy tube change. She also had admissions in May 2024 and August 2024 for UTIs. Her stent was last exchanged on 05/27/2023. The foster gram was reviewed, and there was no hydronephrosis.  Patient states because of her recurrent infections, she wants to  have her kidney removed. She has significant medical comorbidities which would significantly increase the perioperative risk.    PMH: Past Medical History:  Diagnosis Date   Acute bilateral deep vein thrombosis (DVT) of femoral veins (HCC) 01/02/2021   Acute massive pulmonary embolism (HCC) 08/25/2020   Last Assessment & Plan:  Formatting of this note might be different from the original. Post vena caval filter and on xarelto and O2   Anemia    ARF (acute respiratory failure) (HCC)    Cellulitis of left lower leg 06/20/2021   CHF (congestive heart failure) (HCC)    COVID-19    Diabetes mellitus without complication (HCC)    DVT (deep venous thrombosis) (HCC) 09/06/2020   Endometrial cancer (HCC)    GAVE (gastric antral vascular ectasia)    GERD (gastroesophageal reflux disease)    High cholesterol    Hx of blood clots    Hyperlipidemia    Hypertension    IDA (iron deficiency anemia) 09/21/2020   Obesity    Pneumonia 06/11/2022   recovered   Pressure injury of skin 06/21/2021   Pulmonary embolism (HCC)    Sepsis (HCC)    Thrombocytopenia (HCC)    Urinary tract infection 09/13/2021    Surgical History: Past Surgical History:  Procedure Laterality Date   CATARACT EXTRACTION W/PHACO Right 06/04/2022   Procedure: CATARACT EXTRACTION PHACO AND INTRAOCULAR LENS PLACEMENT (IOC) COMPLICATED RIGHT  31.83  02:17.4;  Surgeon: Galen Manila, MD;  Location: [facility];  Service: Ophthalmology;  Laterality: Right;   CATARACT EXTRACTION W/PHACO Left 07/09/2022   Procedure: CATARACT EXTRACTION PHACO AND INTRAOCULAR LENS PLACEMENT (IOC);  Surgeon: Galen Manila, MD;  Location: [facility];  Service: Ophthalmology;  Laterality: Left;  22.75 1:40.3   COLONOSCOPY N/A 06/21/2021   Procedure: COLONOSCOPY;  Surgeon: Toledo, Boykin Nearing, MD;  Location: [facility];  Service: Gastroenterology;  Laterality: N/A;   CYSTOSCOPY W/ URETERAL STENT PLACEMENT Left 09/11/2021    Procedure: CYSTOSCOPY WITH RETROGRADE PYELOGRAM/URETERAL STENT PLACEMENT;  Surgeon: Courtney Altes, MD;  Location: [facility] ORS;  Service: Urology;  Laterality: Left;   CYSTOSCOPY W/ URETERAL STENT PLACEMENT Left 11/17/2021   Procedure: CYSTOSCOPY WITH STENT REPLACEMENT;  Surgeon: Bjorn Pippin, MD;  Location: [facility] ORS;  Service: Urology;  Laterality: Left;   CYSTOSCOPY WITH FULGERATION  11/17/2021   Procedure: CYSTOSCOPY WITH FULGERATION;  Surgeon: Bjorn Pippin, MD;  Location: [facility] OR[facility];  Service: Urology;;   CYSTOSCOPY WITH STENT PLACEMENT Left 10/17/2021   Procedure: CYSTOSCOPY WITH STENT PLACEMENT;  Surgeon: Courtney Altes, MD;  Location: [facility] OR[facility];  Service: Urology;  Laterality: Left;   CYSTOSCOPY WITH URETEROSCOPY, STONE BASKETRY AND STENT PLACEMENT Left 10/16/2021   Procedure: CYSTOSCOPY WITH URETEROSCOPY  AND STENT REMOVAL;  Surgeon: Courtney Altes, MD;  Location: [facility] OR[facility];  Service: Urology;  Laterality: Left;   ENTEROSCOPY N/A 08/17/2021   Procedure: ENTEROSCOPY;  Surgeon: Toney Reil, MD;  Location: [facility];  Service: Gastroenterology;  Laterality: N/A;   ESOPHAGOGASTRODUODENOSCOPY N/A 06/21/2021   Procedure: ESOPHAGOGASTRODUODENOSCOPY (EGD);  Surgeon: Toledo, Boykin Nearing, MD;  Location: [facility];  Service: Gastroenterology;  Laterality: N/A;   ESOPHAGOGASTRODUODENOSCOPY (EGD) WITH PROPOFOL N/A 08/17/2021   Procedure: ESOPHAGOGASTRODUODENOSCOPY (EGD) WITH PROPOFOL;  Surgeon: Toney Reil, MD;  Location: [facility] END[facility]COPY;  Service: Gastroenterology;  Laterality: N/A;   ESOPHAGOGASTRODUODENOSCOPY (EGD) WITH PROPOFOL N/A 10/14/2021   Procedure: ESOPHAGOGASTRODUODENOSCOPY (EGD) WITH PROPOFOL;  Surgeon: Midge Minium, MD;  Location: [facility] END[facility];  Service: Endoscopy;  Laterality: N/A;   ESOPHAGOGASTRODUODENOSCOPY (EGD) WITH PROPOFOL N/A 02/01/2022   Procedure: ESOPHAGOGASTRODUODENOSCOPY (EGD) WITH PROPOFOL;  Surgeon: Toney Reil, MD;  Location: [facility] END[facility]COPY;  Service:  Gastroenterology;  Laterality: N/A;   ESOPHAGOGASTRODUODENOSCOPY (EGD) WITH PROPOFOL N/A 03/15/2022   Procedure: ESOPHAGOGASTRODUODENOSCOPY (EGD) WITH PROPOFOL;  Surgeon: Wyline Mood, MD;  Location: [facility];  Service: Gastroenterology;  Laterality: N/A;   ESOPHAGOGASTRODUODENOSCOPY (EGD) WITH PROPOFOL N/A 04/18/2022   Procedure: ESOPHAGOGASTRODUODENOSCOPY (EGD) WITH PROPOFOL;  Surgeon: Toney Reil, MD;  Location: [facility] ENDOSCOPY;  Service: Gastroenterology;  Laterality: N/A;   ESOPHAGOGASTRODUODENOSCOPY (EGD) WITH PROPOFOL N/A 08/09/2022   Procedure: ESOPHAGOGASTRODUODENOSCOPY (EGD) WITH PROPOFOL;  Surgeon: Wyline Mood, MD;  Location: [facility] ENDOSCOPY;  Service: Gastroenterology;  Laterality: N/A;   GIVENS CAPSULE STUDY N/A 06/22/2021   Procedure: GIVENS CAPSULE STUDY;  Surgeon: Toledo, Boykin Nearing, MD;  Location: [facility] ENDOSCOPY;  Service: Gastroenterology;  Laterality: N/A;   IR NEPHROSTOMY EXCHANGE LEFT  02/25/2022   IR NEPHROSTOMY EXCHANGE LEFT  03/27/2022   IR NEPHROSTOMY EXCHANGE LEFT  05/22/2022   IR NEPHROSTOMY EXCHANGE LEFT  07/24/2022   IR NEPHROSTOMY EXCHANGE LEFT  09/24/2022   IR NEPHROSTOMY EXCHANGE LEFT  11/21/2022   IR NEPHROSTOMY EXCHANGE LEFT  02/17/2023   IR NEPHROSTOMY EXCHANGE LEFT  04/06/2023   IR NEPHROSTOMY EXCHANGE LEFT  05/27/2023   IR NEPHROSTOMY PLACEMENT LEFT  12/27/2021   IVC FILTER INSERTION N/A 08/18/2020   Procedure: IVC FILTER INSERTION;  Surgeon: Renford Dills, MD;  Location: [facility] INVASIVE CV LAB;  Service: Cardiovascular;  Laterality: N/A;   PERIPHERAL VASCULAR THROMBECTOMY Bilateral 01/03/2021   Procedure: PERIPHERAL VASCULAR THROMBECTOMY;  Surgeon: Annice Needy, MD;  Location: [facility]  INVASIVE CV LAB;  Service: Cardiovascular;  Laterality: Bilateral;   PULMONARY THROMBECTOMY N/A 08/18/2020   Procedure: PULMONARY THROMBECTOMY;  Surgeon: Renford Dills, MD;  Location: [facility] INVASIVE CV LAB;  Service: Cardiovascular;  Laterality: N/A;   TUBAL LIGATION      VISCERAL ANGIOGRAPHY N/A 07/18/2021   Procedure: VISCERAL ANGIOGRAPHY;  Surgeon: Annice Needy, MD;  Location: [facility] INVASIVE CV LAB;  Service: Cardiovascular;  Laterality: N/A;    Home Medications:  Allergies as of 07/07/2023   No Known Allergies      Medication List        Accurate as of July 07, 2023  1:32 PM. If you have any questions, ask your nurse or doctor.          acetaminophen 500 MG tablet Commonly known as: TYLENOL Take 500 mg by mouth every 6 (six) hours as needed for mild pain or fever.   albuterol 108 (90 Base) MCG/ACT inhaler Commonly known as: VENTOLIN HFA Inhale 2 puffs into the lungs every 6 (six) hours as needed for wheezing or shortness of breath. What changed: when to take this   Breo Ellipta 100-25 MCG/ACT Aepb Generic drug: fluticasone furoate-vilanterol Inhale 1 puff into the lungs daily.   cyanocobalamin 100 MCG tablet Take 100 mcg by mouth daily.   FeroSul 325 (65 Fe) MG tablet Generic drug: ferrous sulfate Take 325 mg by mouth daily with breakfast.   furosemide 20 MG tablet Commonly known as: LASIX Take 20 mg by mouth daily.   multivitamin with minerals Tabs tablet Take 1 tablet by mouth daily.   pantoprazole 40 MG tablet Commonly known as: PROTONIX Take 1 tablet (40 mg total) by mouth 2 (two) times daily.   rosuvastatin 10 MG tablet Commonly known as: CRESTOR Take 1 tablet (10 mg total) by mouth daily.   simethicone 80 MG chewable tablet Commonly known as: MYLICON Chew 1 tablet (80 mg total) by mouth every 6 (six) hours as needed for flatulence.   vitamin C 1000 MG tablet Take 1,000 mg by mouth daily.        Allergies: No Known Allergies  Family History: Family History  Problem Relation Age of Onset   Hypertension Mother    Diabetes Mother    Diabetes Father    Hypertension Father    High Cholesterol Father    Congestive Heart Failure Father    Breast cancer Cousin     Social History:  reports that she has  never smoked. She has never used smokeless tobacco. She reports that she does not currently use alcohol. She reports that she does not use drugs.   Physical Exam: BP 128/70   Pulse 68   Ht 4\' 11"  (1.499 m)   Wt 195 lb (88.5 kg)   BMI 39.39 kg/m   Constitutional:  Alert and oriented, No acute distress. HEENT: [place] AT Respiratory: Normal respiratory effort, no increased work of breathing. Psychiatric: Normal mood and affect.   Assessment & Plan:    1. Indwelling left nephrostomy tube I recommended capping the tube to see how she does and monitor for recurrent hydronephrosis/sepsis. She does not desire this option, instead she just wants to have her kidney removed. I discussed with her based on her medical comorbidities, there would be significant perioperative risk and would not recommend.   If her nephrectomy was needed to be performed, would need to be performed at a tertiary center. She requested referral to [facility] for a second opinion. Prior to referral, will discuss  with Dr. Celso Sickle at [facility].   I have reviewed the above documentation for accuracy and completeness, and I agree with the above.   Courtney Altes, MD  [facility] [address], [address] [place], [place] [address] [phone]

## 2023-07-08 ENCOUNTER — Encounter: Payer: Self-pay | Admitting: Urology

## 2023-07-16 ENCOUNTER — Encounter: Payer: Self-pay | Admitting: Oncology

## 2023-07-23 ENCOUNTER — Inpatient Hospital Stay: Payer: 59 | Attending: Oncology

## 2023-07-23 ENCOUNTER — Inpatient Hospital Stay
Admission: EM | Admit: 2023-07-23 | Discharge: 2023-07-29 | DRG: 699 | Disposition: A | Discharge status: Home / Self Care | Payer: 59

## 2023-07-23 ENCOUNTER — Encounter: Payer: Self-pay | Admitting: Oncology

## 2023-07-23 ENCOUNTER — Emergency Department: Payer: 59

## 2023-07-23 ENCOUNTER — Other Ambulatory Visit: Payer: Self-pay

## 2023-07-23 DIAGNOSIS — N3040 Irradiation cystitis without hematuria: Secondary | ICD-10-CM | POA: Diagnosis present

## 2023-07-23 DIAGNOSIS — T83512A Infection and inflammatory reaction due to nephrostomy catheter, initial encounter: Principal | ICD-10-CM | POA: Diagnosis present

## 2023-07-23 DIAGNOSIS — Z803 Family history of malignant neoplasm of breast: Secondary | ICD-10-CM

## 2023-07-23 DIAGNOSIS — Z6838 Body mass index (BMI) 38.0-38.9, adult: Secondary | ICD-10-CM

## 2023-07-23 DIAGNOSIS — I5032 Chronic diastolic (congestive) heart failure: Secondary | ICD-10-CM | POA: Diagnosis present

## 2023-07-23 DIAGNOSIS — Z86718 Personal history of other venous thrombosis and embolism: Secondary | ICD-10-CM

## 2023-07-23 DIAGNOSIS — Z8542 Personal history of malignant neoplasm of other parts of uterus: Secondary | ICD-10-CM

## 2023-07-23 DIAGNOSIS — N1330 Unspecified hydronephrosis: Secondary | ICD-10-CM | POA: Diagnosis present

## 2023-07-23 DIAGNOSIS — Z86711 Personal history of pulmonary embolism: Secondary | ICD-10-CM

## 2023-07-23 DIAGNOSIS — E119 Type 2 diabetes mellitus without complications: Secondary | ICD-10-CM | POA: Diagnosis present

## 2023-07-23 DIAGNOSIS — D509 Iron deficiency anemia, unspecified: Secondary | ICD-10-CM | POA: Diagnosis present

## 2023-07-23 DIAGNOSIS — Z936 Other artificial openings of urinary tract status: Secondary | ICD-10-CM

## 2023-07-23 DIAGNOSIS — Y842 Radiological procedure and radiotherapy as the cause of abnormal reaction of the patient, or of later complication, without mention of misadventure at the time of the procedure: Secondary | ICD-10-CM | POA: Diagnosis present

## 2023-07-23 DIAGNOSIS — B952 Enterococcus as the cause of diseases classified elsewhere: Secondary | ICD-10-CM | POA: Diagnosis present

## 2023-07-23 DIAGNOSIS — E876 Hypokalemia: Secondary | ICD-10-CM | POA: Diagnosis present

## 2023-07-23 DIAGNOSIS — E669 Obesity, unspecified: Secondary | ICD-10-CM | POA: Diagnosis present

## 2023-07-23 DIAGNOSIS — N390 Urinary tract infection, site not specified: Principal | ICD-10-CM

## 2023-07-23 DIAGNOSIS — B965 Pseudomonas (aeruginosa) (mallei) (pseudomallei) as the cause of diseases classified elsewhere: Secondary | ICD-10-CM | POA: Diagnosis present

## 2023-07-23 DIAGNOSIS — E7800 Pure hypercholesterolemia, unspecified: Secondary | ICD-10-CM | POA: Diagnosis present

## 2023-07-23 DIAGNOSIS — J449 Chronic obstructive pulmonary disease, unspecified: Secondary | ICD-10-CM | POA: Diagnosis present

## 2023-07-23 DIAGNOSIS — Z83438 Family history of other disorder of lipoprotein metabolism and other lipidemia: Secondary | ICD-10-CM

## 2023-07-23 DIAGNOSIS — Z8249 Family history of ischemic heart disease and other diseases of the circulatory system: Secondary | ICD-10-CM

## 2023-07-23 DIAGNOSIS — D72819 Decreased white blood cell count, unspecified: Secondary | ICD-10-CM | POA: Diagnosis present

## 2023-07-23 DIAGNOSIS — I10 Essential (primary) hypertension: Secondary | ICD-10-CM | POA: Diagnosis present

## 2023-07-23 DIAGNOSIS — Z833 Family history of diabetes mellitus: Secondary | ICD-10-CM

## 2023-07-23 DIAGNOSIS — Z95828 Presence of other vascular implants and grafts: Secondary | ICD-10-CM

## 2023-07-23 DIAGNOSIS — Y846 Urinary catheterization as the cause of abnormal reaction of the patient, or of later complication, without mention of misadventure at the time of the procedure: Secondary | ICD-10-CM | POA: Diagnosis present

## 2023-07-23 DIAGNOSIS — I110 Hypertensive heart disease with heart failure: Secondary | ICD-10-CM | POA: Diagnosis present

## 2023-07-23 LAB — CBC WITH DIFFERENTIAL/PLATELET
Abs Immature Granulocytes: 0.02 10*3/uL (ref 0.00–0.07)
Basophils Absolute: 0 10*3/uL (ref 0.0–0.1)
Basophils Relative: 0 %
Eosinophils Absolute: 0.1 10*3/uL (ref 0.0–0.5)
Eosinophils Relative: 2 %
HCT: 32.9 % — ABNORMAL LOW (ref 36.0–46.0)
Hemoglobin: 9.9 g/dL — ABNORMAL LOW (ref 12.0–15.0)
Immature Granulocytes: 0 %
Lymphocytes Relative: 12 %
Lymphs Abs: 0.7 10*3/uL (ref 0.7–4.0)
MCH: 29 pg (ref 26.0–34.0)
MCHC: 30.1 g/dL (ref 30.0–36.0)
MCV: 96.5 fL (ref 80.0–100.0)
Monocytes Absolute: 0.3 10*3/uL (ref 0.1–1.0)
Monocytes Relative: 5 %
Neutro Abs: 4.4 10*3/uL (ref 1.7–7.7)
Neutrophils Relative %: 81 %
Platelets: 266 10*3/uL (ref 150–400)
RBC: 3.41 MIL/uL — ABNORMAL LOW (ref 3.87–5.11)
RDW: 16.7 % — ABNORMAL HIGH (ref 11.5–15.5)
WBC: 5.5 10*3/uL (ref 4.0–10.5)
nRBC: 0 % (ref 0.0–0.2)

## 2023-07-23 LAB — COMPREHENSIVE METABOLIC PANEL
ALT: 18 U/L (ref 0–44)
AST: 49 U/L — ABNORMAL HIGH (ref 15–41)
Albumin: 3.2 g/dL — ABNORMAL LOW (ref 3.5–5.0)
Alkaline Phosphatase: 49 U/L (ref 38–126)
Anion gap: 13 (ref 5–15)
BUN: 24 mg/dL — ABNORMAL HIGH (ref 8–23)
CO2: 20 mmol/L — ABNORMAL LOW (ref 22–32)
Calcium: 8.5 mg/dL — ABNORMAL LOW (ref 8.9–10.3)
Chloride: 103 mmol/L (ref 98–111)
Creatinine, Ser: 0.99 mg/dL (ref 0.44–1.00)
GFR, Estimated: >60 mL/min (ref >60)
Glucose, Bld: 170 mg/dL — ABNORMAL HIGH (ref 70–99)
Potassium: 4.3 mmol/L (ref 3.5–5.1)
Sodium: 136 mmol/L (ref 135–145)
Total Bilirubin: 1.1 mg/dL (ref 0.0–1.2)
Total Protein: 6.7 g/dL (ref 6.5–8.1)

## 2023-07-23 LAB — BRAIN NATRIURETIC PEPTIDE: B Natriuretic Peptide: 35 pg/mL (ref 0.0–100.0)

## 2023-07-23 LAB — TROPONIN I (HIGH SENSITIVITY)
Troponin I (High Sensitivity): 11 ng/L (ref <18)
Troponin I (High Sensitivity): 12 ng/L (ref <18)

## 2023-07-23 MED ORDER — IOHEXOL 350 MG/ML SOLN
100.0000 mL | Freq: Once | INTRAVENOUS | Status: AC | PRN
  Administered 2023-07-23: 100 mL via INTRAVENOUS

## 2023-07-23 NOTE — ED Triage Notes (Signed)
 Pt arrives via [facility] with CC of central CP that began this morning. Described as tightness that is making pt feel short of breath. Pt has hx of CHF and reports recent weight gain.

## 2023-07-23 NOTE — ED Provider Triage Note (Signed)
 Emergency Medicine Provider Triage Evaluation Note  Courtney Mack , a 68 y.o. female  was evaluated in triage.  Pt complains of chest pain, since this morning.  Associated to shortness of breath.  Patient has urine catheter in place.   Review of Systems  Positive:  Negative:   Physical Exam  BP 100/60 (BP Location: Right Arm)   Pulse 91   Temp 98.3 F (36.8 C) (Oral)   Resp 20   SpO2 100%  Gen:   Awake, no distress,  Resp:  Normal effort  MSK:   Moves extremities without difficulty  Other:    Medical Decision Making  Medically screening exam initiated at 7:11 PM.  Appropriate orders placed.  RHYDER GRANDIN was informed that the remainder of the evaluation will be completed by another provider, this initial triage assessment does not replace that evaluation, and the importance of remaining in the ED until their evaluation is complete.  Patient denied chest pain, ordering labs chest  x-ray UA EKG   Awilda Lennox, PA-C 07/23/23 1913

## 2023-07-23 NOTE — ED Provider Notes (Signed)
[facility] Provider Note    Event Date/Time   First MD Initiated Contact with Patient 07/23/23 2306     (approximate)   History   Chest Pain   HPI Courtney Mack is a 68 y.o. female with history of DVT, PE not on blood thinner, diastolic heart failure, HTN, HLD, chronic nephrostomy tube, COPD presenting today for chest pain.  Patient states this morning she had onset of centralized chest pain that she thought was indigestion.  It did not resolve throughout the day.  Denies any radiation.  Intermittent shortness of breath which she is hard to describe if it is worse than her baseline.  Otherwise denies nausea, vomiting.  Also notes some abdominal pain in the left lower quadrant which is typically chronic but worsened recently.  Diarrhea present for the past couple of days.  Otherwise denies dysuria.  Patient states she was taken off her blood thinner 2 years ago due to anemia requiring blood transfusions.     Physical Exam   Triage Vital Signs: ED Triage Vitals  Encounter Vitals Group     BP 07/23/23 1902 100/60     Systolic BP Percentile --      Diastolic BP Percentile --      Pulse Rate 07/23/23 1902 91     Resp 07/23/23 1902 20     Temp 07/23/23 1902 98.3 F (36.8 C)     Temp Source 07/23/23 1902 Oral     SpO2 07/23/23 1902 100 %     Weight --      Height --      Head Circumference --      Peak Flow --      Pain Score 07/23/23 1907 7     Pain Loc --      Pain Education --      Exclude from Growth Chart --     Most recent vital signs: Vitals:   07/23/23 1902 07/23/23 2125  BP: 100/60 119/85  Pulse: 91 95  Resp: 20 20  Temp: 98.3 F (36.8 C) 98.3 F (36.8 C)  SpO2: 100% 99%   Physical Exam: I have reviewed the vital signs and nursing notes. General: Awake, alert, no acute distress.  Nontoxic appearing. Head:  Atraumatic, normocephalic.   ENT:  EOM intact, PERRL. Oral mucosa is pink and moist with no lesions. Neck: Neck is supple  with full range of motion, No meningeal signs. Cardiovascular:  RRR, No murmurs. Peripheral pulses palpable and equal bilaterally. Respiratory:  Symmetrical chest wall expansion.  No rhonchi, rales, or wheezes.  Good air movement throughout.  No use of accessory muscles.   Musculoskeletal:  No cyanosis or edema. Moving extremities with full ROM Abdomen:  Soft, mild tenderness to palpation in left lower quadrant, nondistended.  Nephrostomy tube in place on left side Neuro:  GCS 15, moving all four extremities, interacting appropriately. Speech clear. Psych:  Calm, appropriate.   Skin:  Warm, dry, no rash.    ED Results / Procedures / Treatments   Labs (all labs ordered are listed, but only abnormal results are displayed) Labs Reviewed  CBC WITH DIFFERENTIAL/PLATELET - Abnormal; Notable for the following components:      Result Value   RBC 3.41 (*)    Hemoglobin 9.9 (*)    HCT 32.9 (*)    RDW 16.7 (*)    All other components within normal limits  COMPREHENSIVE METABOLIC PANEL - Abnormal; Notable for the following components:   CO2  20 (*)    Glucose, Bld 170 (*)    BUN 24 (*)    Calcium 8.5 (*)    Albumin 3.2 (*)    AST 49 (*)    All other components within normal limits  URINALYSIS, ROUTINE W REFLEX MICROSCOPIC - Abnormal; Notable for the following components:   Color, Urine YELLOW (*)    APPearance TURBID (*)    Hgb urine dipstick SMALL (*)    Protein, ur 100 (*)    Leukocytes,Ua LARGE (*)    Bacteria, UA MANY (*)    All other components within normal limits  RESP PANEL BY RT-PCR (RSV, FLU A&B, COVID)  RVPGX2  BRAIN NATRIURETIC PEPTIDE  TROPONIN I (HIGH SENSITIVITY)  TROPONIN I (HIGH SENSITIVITY)     EKG My EKG interpretation: Rate of 92, normal sinus rhythm, normal axis, normal intervals.  No acute ST elevations or depressions   RADIOLOGY Independently interpreted CTA chest and CT abdomen/pelvis with no acute pathology   PROCEDURES:  Critical Care performed:  No  Procedures   MEDICATIONS ORDERED IN ED: Medications  sodium chloride 0.9 % bolus 1,000 mL (has no administration in time range)  iohexol (OMNIPAQUE) 350 MG/ML injection 100 mL (100 mLs Intravenous Contrast Given 07/23/23 2354)  cephALEXin (KEFLEX) capsule 500 mg (500 mg Oral Given 07/24/23 0213)  cefTRIAXone (ROCEPHIN) 1 g in sodium chloride 0.9 % 100 mL IVPB (0 g Intravenous Stopped 07/24/23 0406)     IMPRESSION / MDM / ASSESSMENT AND PLAN / ED COURSE  I reviewed the triage vital signs and the nursing notes.                              Differential diagnosis includes, but is not limited to, ACS, PE, pneumonia, COPD exacerbation, CHF exacerbation, pneumothorax, diverticulitis, colitis, UTI, pyelonephritis  Patient's presentation is most consistent with acute complicated illness / injury requiring diagnostic workup.  Patient is a 68 year old female presenting today for chest pain, shortness of breath, lower abdominal pain, and dysuria.  Vital signs stable on arrival and physical exam most notable for tenderness palpation in the lower abdomen.  EKG without ischemia and troponin negative x 2.  CTA chest to evaluate for PE or pneumonia was negative.  Respiratory panel negative.  CBC with slight anemia but improved from prior baseline.  UA significantly positive for UTI.  CT abdomen/pelvis shows evidence of cystitis but no further acute pathology.  Patient does qualify the complicated UTI given her nephrostomy tube.  Looked at prior ED visits and frequently has to be admitted due to worsening infection with complicated UTIs.  Vital signs were stable here and attempted dose of IV ceftriaxone and reevaluated.  Patient with profound weakness on reevaluation and unable to ambulate more than 2 feet with associated tachycardia.  No hypotension.  Will give 1 L fluids and admit to hospitalist for complicated UTI and fatigue.  The patient is on the cardiac monitor to evaluate for evidence of arrhythmia  and/or significant heart rate changes. Clinical Course as of 07/24/23 0435  Thu Jul 24, 2023  0113 Urinalysis, Routine w reflex microscopic -Urine, Clean Catch(!) Positive UTI [DW]  0434 Attempted to ambulate patient.  Noticeable fatigue and unable to take more than a couple steps before having to sit down.  Does get significantly tachycardic into the 120s.  No hypotension.  Given profound fatigue in the setting of complicated UTI with her nephrostomy tube, will admit to hospitalist  for ongoing antibiotic treatment [DW]    Clinical Course User Index [DW] Janith Lima, MD     FINAL CLINICAL IMPRESSION(S) / ED DIAGNOSES   Final diagnoses:  Complicated UTI (urinary tract infection)     Rx / DC Orders   ED Discharge Orders     None        Note:  This document was prepared using Dragon voice recognition software and may include unintentional dictation errors.   Janith Lima, MD 07/24/23 [phone]

## 2023-07-23 NOTE — ED Triage Notes (Signed)
 First Nurse Note: Patient to ED via [facility] from home for centralized chest pressure. Hx CHF.   Given 324 ASA 22L forearm  EMS VS: 91/45 99%   87 HR

## 2023-07-24 ENCOUNTER — Encounter: Payer: Self-pay | Admitting: Internal Medicine

## 2023-07-24 ENCOUNTER — Other Ambulatory Visit: Payer: Self-pay | Admitting: Family

## 2023-07-24 DIAGNOSIS — Z833 Family history of diabetes mellitus: Secondary | ICD-10-CM | POA: Diagnosis not present

## 2023-07-24 DIAGNOSIS — J449 Chronic obstructive pulmonary disease, unspecified: Secondary | ICD-10-CM | POA: Diagnosis present

## 2023-07-24 DIAGNOSIS — E119 Type 2 diabetes mellitus without complications: Secondary | ICD-10-CM | POA: Diagnosis present

## 2023-07-24 DIAGNOSIS — Z803 Family history of malignant neoplasm of breast: Secondary | ICD-10-CM | POA: Diagnosis not present

## 2023-07-24 DIAGNOSIS — D509 Iron deficiency anemia, unspecified: Secondary | ICD-10-CM | POA: Diagnosis present

## 2023-07-24 DIAGNOSIS — I5032 Chronic diastolic (congestive) heart failure: Secondary | ICD-10-CM | POA: Diagnosis present

## 2023-07-24 DIAGNOSIS — I110 Hypertensive heart disease with heart failure: Secondary | ICD-10-CM | POA: Diagnosis present

## 2023-07-24 DIAGNOSIS — E876 Hypokalemia: Secondary | ICD-10-CM | POA: Diagnosis present

## 2023-07-24 DIAGNOSIS — T83512A Infection and inflammatory reaction due to nephrostomy catheter, initial encounter: Secondary | ICD-10-CM | POA: Diagnosis present

## 2023-07-24 DIAGNOSIS — N390 Urinary tract infection, site not specified: Secondary | ICD-10-CM | POA: Diagnosis present

## 2023-07-24 DIAGNOSIS — Y842 Radiological procedure and radiotherapy as the cause of abnormal reaction of the patient, or of later complication, without mention of misadventure at the time of the procedure: Secondary | ICD-10-CM | POA: Diagnosis present

## 2023-07-24 DIAGNOSIS — Z95828 Presence of other vascular implants and grafts: Secondary | ICD-10-CM | POA: Diagnosis not present

## 2023-07-24 DIAGNOSIS — B952 Enterococcus as the cause of diseases classified elsewhere: Secondary | ICD-10-CM | POA: Diagnosis present

## 2023-07-24 DIAGNOSIS — E669 Obesity, unspecified: Secondary | ICD-10-CM | POA: Diagnosis present

## 2023-07-24 DIAGNOSIS — Y846 Urinary catheterization as the cause of abnormal reaction of the patient, or of later complication, without mention of misadventure at the time of the procedure: Secondary | ICD-10-CM | POA: Diagnosis present

## 2023-07-24 DIAGNOSIS — Z86711 Personal history of pulmonary embolism: Secondary | ICD-10-CM | POA: Diagnosis not present

## 2023-07-24 DIAGNOSIS — Z83438 Family history of other disorder of lipoprotein metabolism and other lipidemia: Secondary | ICD-10-CM | POA: Diagnosis not present

## 2023-07-24 DIAGNOSIS — Z86718 Personal history of other venous thrombosis and embolism: Secondary | ICD-10-CM | POA: Diagnosis not present

## 2023-07-24 DIAGNOSIS — Z6838 Body mass index (BMI) 38.0-38.9, adult: Secondary | ICD-10-CM | POA: Diagnosis not present

## 2023-07-24 DIAGNOSIS — Z8249 Family history of ischemic heart disease and other diseases of the circulatory system: Secondary | ICD-10-CM | POA: Diagnosis not present

## 2023-07-24 DIAGNOSIS — E7800 Pure hypercholesterolemia, unspecified: Secondary | ICD-10-CM | POA: Diagnosis present

## 2023-07-24 DIAGNOSIS — N3040 Irradiation cystitis without hematuria: Secondary | ICD-10-CM | POA: Diagnosis present

## 2023-07-24 DIAGNOSIS — Z8542 Personal history of malignant neoplasm of other parts of uterus: Secondary | ICD-10-CM | POA: Diagnosis not present

## 2023-07-24 DIAGNOSIS — D72819 Decreased white blood cell count, unspecified: Secondary | ICD-10-CM | POA: Diagnosis present

## 2023-07-24 DIAGNOSIS — B965 Pseudomonas (aeruginosa) (mallei) (pseudomallei) as the cause of diseases classified elsewhere: Secondary | ICD-10-CM | POA: Diagnosis present

## 2023-07-24 LAB — URINALYSIS, ROUTINE W REFLEX MICROSCOPIC
Bilirubin Urine: NEGATIVE
Glucose, UA: NEGATIVE mg/dL
Ketones, ur: NEGATIVE mg/dL
Nitrite: NEGATIVE
Protein, ur: 100 mg/dL — AB
Specific Gravity, Urine: 1.027 (ref 1.005–1.030)
WBC, UA: >50 WBC/hpf (ref 0–5)
pH: 5 (ref 5.0–8.0)

## 2023-07-24 LAB — RESP PANEL BY RT-PCR (RSV, FLU A&B, COVID)  RVPGX2
Influenza A by PCR: NEGATIVE
Influenza B by PCR: NEGATIVE
Resp Syncytial Virus by PCR: NEGATIVE
SARS Coronavirus 2 by RT PCR: NEGATIVE

## 2023-07-24 MED ORDER — ROSUVASTATIN CALCIUM 10 MG PO TABS
10.0000 mg | ORAL_TABLET | Freq: Every day | ORAL | Status: DC
  Administered 2023-07-24 – 2023-07-29 (×6): 10 mg via ORAL
  Filled 2023-07-24 (×6): qty 1

## 2023-07-24 MED ORDER — SIMETHICONE 80 MG PO CHEW: 80.0000 mg | CHEWABLE_TABLET | Freq: Four times a day (QID) | ORAL | Status: DC | PRN

## 2023-07-24 MED ORDER — FLUTICASONE FUROATE-VILANTEROL 100-25 MCG/ACT IN AEPB
1.0000 | INHALATION_SPRAY | Freq: Every day | RESPIRATORY_TRACT | Status: DC
Start: 2023-07-24 — End: 2023-07-30
  Administered 2023-07-24 – 2023-07-29 (×6): 1 via RESPIRATORY_TRACT
  Filled 2023-07-24: qty 28

## 2023-07-24 MED ORDER — ACETAMINOPHEN 325 MG PO TABS
650.0000 mg | ORAL_TABLET | Freq: Four times a day (QID) | ORAL | Status: DC | PRN
  Administered 2023-07-26: 650 mg via ORAL
  Filled 2023-07-24 (×2): qty 2

## 2023-07-24 MED ORDER — ONDANSETRON HCL 4 MG PO TABS: 4.0000 mg | ORAL_TABLET | Freq: Four times a day (QID) | ORAL | Status: DC | PRN

## 2023-07-24 MED ORDER — ACETAMINOPHEN 650 MG RE SUPP: 650.0000 mg | Freq: Four times a day (QID) | RECTAL | Status: DC | PRN

## 2023-07-24 MED ORDER — HYDROCODONE-ACETAMINOPHEN 5-325 MG PO TABS
1.0000 | ORAL_TABLET | ORAL | Status: DC | PRN
  Administered 2023-07-25 – 2023-07-28 (×6): 1 via ORAL
  Filled 2023-07-24: qty 1
  Filled 2023-07-24: qty 2
  Filled 2023-07-24 (×8): qty 1

## 2023-07-24 MED ORDER — SODIUM CHLORIDE 0.9 % IV SOLN
1.0000 g | INTRAVENOUS | Status: DC
  Administered 2023-07-25 – 2023-07-28 (×4): 1 g via INTRAVENOUS
  Filled 2023-07-24 (×4): qty 10

## 2023-07-24 MED ORDER — ENOXAPARIN SODIUM 60 MG/0.6ML IJ SOSY
0.5000 mg/kg | PREFILLED_SYRINGE | INTRAMUSCULAR | Status: DC
  Administered 2023-07-24 – 2023-07-29 (×6): 50 mg via SUBCUTANEOUS
  Filled 2023-07-24 (×6): qty 0.6

## 2023-07-24 MED ORDER — FERROUS SULFATE 325 (65 FE) MG PO TABS
325.0000 mg | ORAL_TABLET | Freq: Every day | ORAL | Status: DC
  Administered 2023-07-24 – 2023-07-29 (×6): 325 mg via ORAL
  Filled 2023-07-24 (×6): qty 1

## 2023-07-24 MED ORDER — SODIUM CHLORIDE 0.9 % IV BOLUS
1000.0000 mL | Freq: Once | INTRAVENOUS | Status: AC
Start: 2023-07-24 — End: 2023-07-24
  Administered 2023-07-24: 1000 mL via INTRAVENOUS

## 2023-07-24 MED ORDER — CEPHALEXIN 500 MG PO CAPS
500.0000 mg | ORAL_CAPSULE | Freq: Once | ORAL | Status: AC
  Administered 2023-07-24: 500 mg via ORAL
  Filled 2023-07-24: qty 1

## 2023-07-24 MED ORDER — CEFTRIAXONE SODIUM 1 G IJ SOLR
1.0000 g | Freq: Once | INTRAMUSCULAR | Status: AC
  Administered 2023-07-24: 1 g via INTRAVENOUS
  Filled 2023-07-24: qty 10

## 2023-07-24 MED ORDER — PANTOPRAZOLE SODIUM 40 MG PO TBEC
40.0000 mg | DELAYED_RELEASE_TABLET | Freq: Two times a day (BID) | ORAL | Status: DC
  Administered 2023-07-24 – 2023-07-29 (×11): 40 mg via ORAL
  Filled 2023-07-24 (×11): qty 1

## 2023-07-24 MED ORDER — ONDANSETRON HCL 4 MG/2ML IJ SOLN: 4.0000 mg | Freq: Four times a day (QID) | INTRAMUSCULAR | Status: DC | PRN

## 2023-07-24 MED ORDER — FUROSEMIDE 20 MG PO TABS
20.0000 mg | ORAL_TABLET | Freq: Every day | ORAL | Status: DC
  Administered 2023-07-24 – 2023-07-28 (×5): 20 mg via ORAL
  Filled 2023-07-24 (×5): qty 1

## 2023-07-24 MED ORDER — ALBUTEROL SULFATE (2.5 MG/3ML) 0.083% IN NEBU: 2.5000 mg | INHALATION_SOLUTION | RESPIRATORY_TRACT | Status: DC | PRN

## 2023-07-24 NOTE — ED Notes (Signed)
Pt sleeping. 

## 2023-07-24 NOTE — Progress Notes (Signed)
Anticoagulation monitoring(Lovenox):  68 yo female ordered Lovenox 40 mg Q24h    Filed Weights   07/24/23 0611  Weight: 100.8 kg (222 lb 3.6 oz)   BMI 44.9    Lab Results  Component Value Date   CREATININE 0.99 07/23/2023   CREATININE 0.83 06/23/2023   CREATININE 0.68 06/19/2023   Estimated Creatinine Clearance: 56.8 mL/min (by C-G formula based on SCr of 0.99 mg/dL). Hemoglobin & Hematocrit     Component Value Date/Time   HGB 9.9 (L) 07/23/2023 1910   HGB 9.8 (L) 03/24/2023 1351   HCT 32.9 (L) 07/23/2023 1910     Per Protocol for Patient with estCrcl > 30 ml/min and BMI > 30, will transition to Lovenox 50 mg Q24h.

## 2023-07-24 NOTE — ED Notes (Signed)
Pts daughter [name] updated at this time

## 2023-07-24 NOTE — Progress Notes (Signed)
Brief urology consult:  Very comorbid and complex 67 year old female well-known to urology service who has been managed primarily by Dr. Lonna Cobb.  She has chronic left hydronephrosis likely secondary to a left distal ureteral stricture, did not tolerate ureteral stent with recurrent gross hematuria requiring transfusion and CBI, and has been managed with a left-sided percutaneous nephrostomy tube since June 2023.  This is managed by interventional radiology and exchanged every 8 weeks.  She most recently was seen by Dr. Lonna Cobb in clinic on 07/07/2023 and she was referred to [facility] for consideration of a left radical nephrectomy.  With her comorbidities, not a candidate for nephrectomy here at [facility].  Currently admitted with possible UTI.  I reviewed the CT scan from showing left nephrostomy tube in appropriate position, no hydronephrosis.  -Agree with antibiotics, can continue nephrostomy tube changes with interventional radiology every 8 weeks as scheduled -Has been referred previously to [facility] for consideration of left radical nephrectomy for more definitive management of her recurrent infections and chronic left-sided nephrostomy tube  Legrand Rams, MD 07/24/2023

## 2023-07-24 NOTE — Progress Notes (Signed)
   07/24/23 1114  Readmission Prevention Plan - Extreme Risk  Transportation Screening Complete  Medication Review Complete  PCP or Specialist appointment within 3-5 days of discharge Complete  Social Work consult for recovery care planning/counseling (includes patient and caregiver) Complete  Palliative Care Screening Not Applicable  Skilled Nursing Facility Not Applicable   CSW met with pt at bedside to complete High Risk Readmission Assessment.   Admitted for: Kidney and UTI Admitted from: Home  PCP: [facility]  Pharmacy: [facility] in [place]  Current home health/prior home health/DME:[facility]   No current TOC needs. If any TOC needs arise, please place [facility] consult.

## 2023-07-24 NOTE — Assessment & Plan Note (Signed)
Rocephin based on past sensitivities Follow cultures

## 2023-07-24 NOTE — Progress Notes (Addendum)
Progress Note   Patient: Courtney Mack JYN:[id number] DOB: Sep 07, 1955 DOA: 07/23/2023     0 DOS: the patient was seen and examined on 07/24/2023   Brief hospital course: 68 yrs old female admitted for Complicated UTI.  Has left nephrostomy tube.  Presented with shortness of breath and chest pain, negative CTA chest, troponin   Assessment and Plan: Complicated urinary tract infection with left nephrostomy tube Continue IV Rocephin based on past sensitivities Follow cultures Consulted Urology   SOB (shortness of breath):  CTA negative for PE.   Patient received 1 dose of Rocephin and azithromycin, will hold off treating for PNA.   No oxygen desaturation.    COPD (chronic obstructive pulmonary disease) (HCC):  -Bronchodilators as needed Mucinex   Chronic diastolic CHF (congestive heart failure) (HCC):  Compensated 2D echo 06/2023 showed G1 DD LVEF 50 to 55% BNP normal  -Hold Lasix   Nephrostomy status (HCC): pt has hx of hydronephrosis of left kidney with left nephrostomy status [facility]):  CT abdomen: Mild left renal atrophy. Indwelling percutaneous nephrostomy. No hydronephrosis. Mild perivesical stranding, suggesting cystitis. Patient reports dysuria, will need to rule out recurrent UTI. -Patient received 1 dose of Rocephin  Follow cultures    HTN (hypertension):  Continue home meds   IDA (iron deficiency anemia):  -Continue iron supplement   Dyslipidemia -Crestor   Obesity (BMI 30-39.9): Body weight 86.2 kg, BMI 38.38 -Encourage losing weight, -Exercise and healthy diet     DVT prophylaxis: Lovenox   Consults: none   Advance Care Planning:   Code Status: Prior    Family Communication: none   Disposition Plan: Back to previous home environment* Complicated urinary tract infection Rocephin based on past sensitivities Follow cultures        Subjective: Reported pain in left flank> right flank. +ve for dysuria. Has chills Reported Dyspnea and mild  cough Denies headache, dizziness, nausea/vomiting Reported Diarrhea  Physical Exam: Vitals:   07/24/23 0438 07/24/23 0611 07/24/23 0743 07/24/23 0747  BP: (!) 176/68   (!) 110/46  Pulse: (!) 110   100  Resp: (!) 22   20  Temp:    98 F (36.7 C)  TempSrc:    Oral  SpO2: 100%   96%  Weight:  100.8 kg 100.8 kg   Height:   4\' 11"  (1.499 m)    Physical Exam Constitutional:      General: She is not in acute distress.    Appearance: She is ill-appearing.  HENT:     Head: Normocephalic and atraumatic.     Nose: Nose normal. No congestion.     Mouth/Throat:     Mouth: Mucous membranes are moist.     Pharynx: Oropharynx is clear. No oropharyngeal exudate.  Eyes:     Extraocular Movements: Extraocular movements intact.     Conjunctiva/sclera: Conjunctivae normal.  Cardiovascular:     Rate and Rhythm: Normal rate and regular rhythm.     Heart sounds: Normal heart sounds.  Pulmonary:     Effort: Pulmonary effort is normal.     Breath sounds: Normal breath sounds.  Abdominal:     General: Abdomen is flat.     Palpations: Abdomen is soft.     Tenderness: There is right CVA tenderness and left CVA tenderness.     Comments: Left CVA tenderness >> Rt CVA tenderness Left Nephrostomy tube intact--> has turbid urine outflow  Musculoskeletal:        General: No swelling or tenderness. Normal range of  motion.     Cervical back: Normal range of motion and neck supple.     Right lower leg: No edema.     Left lower leg: No edema.  Skin:    General: Skin is warm.     Capillary Refill: Capillary refill takes 2 to 3 seconds.     Findings: No rash.  Neurological:     General: No focal deficit present.     Cranial Nerves: No cranial nerve deficit.     Sensory: No sensory deficit.     Motor: No weakness.  Psychiatric:        Mood and Affect: Mood normal.        Thought Content: Thought content normal.     Data Reviewed:  Latest Reference Range & Units 07/24/23 00:48  Appearance CLEAR   TURBID !  Bilirubin Urine NEGATIVE  NEGATIVE  Color, Urine YELLOW  YELLOW !  Glucose, UA NEGATIVE mg/dL NEGATIVE  Hgb urine dipstick NEGATIVE  SMALL !  Ketones, ur NEGATIVE mg/dL NEGATIVE  Leukocytes,Ua NEGATIVE  LARGE !  Nitrite NEGATIVE  NEGATIVE  pH 5.0 - 8.0  5.0  Protein NEGATIVE mg/dL 161 !  Specific Gravity, Urine 1.005 - 1.030  1.027  Bacteria, UA NONE SEEN  MANY !  Mucus  PRESENT  RBC / HPF 0 - 5 RBC/hpf 21-50  Squamous Epithelial / HPF 0 - 5 /HPF 0-5  WBC Clumps  PRESENT  WBC, UA 0 - 5 WBC/hpf >50    Latest Reference Range & Units 07/23/23 19:10  Sodium 135 - 145 mmol/L 136  Potassium 3.5 - 5.1 mmol/L 4.3  Chloride 98 - 111 mmol/L 103  CO2 22 - 32 mmol/L 20 (L)  Glucose 70 - 99 mg/dL 096 (H)  BUN 8 - 23 mg/dL 24 (H)  Creatinine 0.45 - 1.00 mg/dL 4.09  Calcium 8.9 - 81.1 mg/dL 8.5 (L)  Anion gap 5 - 15  13  Alkaline Phosphatase 38 - 126 U/L 49  Albumin 3.5 - 5.0 g/dL 3.2 (L)  AST 15 - 41 U/L 49 (H)  ALT 0 - 44 U/L 18  Total Protein 6.5 - 8.1 g/dL 6.7  Total Bilirubin 0.0 - 1.2 mg/dL 1.1  GFR, Estimated >91 mL/min >60  B Natriuretic Peptide 0.0 - 100.0 pg/mL 35.0  Troponin I (High Sensitivity) <18 ng/L 11  WBC 4.0 - 10.5 K/uL 5.5  RBC 3.87 - 5.11 MIL/uL 3.41 (L)  Hemoglobin 12.0 - 15.0 g/dL 9.9 (L)  HCT 47.8 - 29.5 % 32.9 (L)  MCV 80.0 - 100.0 fL 96.5  MCH 26.0 - 34.0 pg 29.0  MCHC 30.0 - 36.0 g/dL 62.1  RDW 30.8 - 65.7 % 16.7 (H)  Platelets 150 - 400 K/uL 266  nRBC 0.0 - 0.2 % 0.0  Neutrophils % 81  Lymphocytes % 12  Monocytes Relative % 5  Eosinophil % 2  Basophil % 0  Immature Granulocytes % 0  NEUT# 1.7 - 7.7 K/uL 4.4  Lymphs Abs 0.7 - 4.0 K/uL 0.7  Monocyte # 0.1 - 1.0 K/uL 0.3  Eosinophils Absolute 0.0 - 0.5 K/uL 0.1  Basophils Absolute 0.0 - 0.1 K/uL 0.0  Abs Immature Granulocytes 0.00 - 0.07 K/uL 0.02  (L): Data is abnormally low (H): Data is abnormally high  Family Communication: Explained plan of care with the pt  Disposition: Status  is: Inpatient   Planned Discharge Destination: Home    Time spent: 35 minutes  Author: Ernestene Mention, MD 07/24/2023 9:12 AM  For on call review [email or web address].

## 2023-07-24 NOTE — H&P (Signed)
History and Physical    Patient: Courtney Mack:[id number] DOB: Jul 29, 1955 DOA: 07/23/2023 DOS: the patient was seen and examined on 07/24/2023 PCP: [facility]  Patient coming from: Home  Chief Complaint:  Chief Complaint  Patient presents with   Chest Pain    HPI: Courtney Mack is a 68 y.o. female with medical history significant for left-sided hydronephrosis status post nephrostomy tube placement, hypertension, COPD, history of extensive bilateral PE and DVT s/p IVC filter, diastolic CHF , frequent UTIs requiring multiple hospitalizations, most recently a month ago from 12/11 to 06/25/23 with urine culture showing multiple species, treated with Rocephin who presents to the ED with multiple complaints including shortness of breath, chest pain, fatigue and dysuria.  During her recent hospitalization a month ago she presented with shortness of breath, CTA chest was unremarkable and echo showed grade 1 DD.  Patient is denying fever, abdominal pain or vomiting and has no lower extremity pain or swelling. ED course and data review:Vitals within normal limits on arrival however she became tachycardic to 110 and tachypneic to 22 with attempts to ambulate in anticipation of discharge. Workup notable for the following: Urinalysis showing large leukocytes and many bacteria but with normal WBC of 5500 Troponin 12, BNP 35 Hemoglobin at baseline at 9.9 CMP mostly unremarkable EKG, personally viewed and interpreted showing NSR at 92 with no acute ST-T wave changes. CTA chest abdomen and pelvis: Negative for PE shows mild left renal atrophy with indwelling percutaneous nephrostomy without hydronephrosis and mild perivesical stranding suggesting cystitis.  Patient was given a fluid bolus and started on ceftriaxone Hospitalist consulted for admission.   Review of Systems: As mentioned in the history of present illness. All other systems reviewed and are  negative.  Past Medical History:  Diagnosis Date   Acute bilateral deep vein thrombosis (DVT) of femoral veins (HCC) 01/02/2021   Acute massive pulmonary embolism (HCC) 08/25/2020   Last Assessment & Plan:  Formatting of this note might be different from the original. Post vena caval filter and on xarelto and O2   Anemia    ARF (acute respiratory failure) (HCC)    Cellulitis of left lower leg 06/20/2021   CHF (congestive heart failure) (HCC)    COVID-19    Diabetes mellitus without complication (HCC)    DVT (deep venous thrombosis) (HCC) 09/06/2020   Endometrial cancer (HCC)    GAVE (gastric antral vascular ectasia)    GERD (gastroesophageal reflux disease)    High cholesterol    Hx of blood clots    Hyperlipidemia    Hypertension    IDA (iron deficiency anemia) 09/21/2020   Obesity    Pneumonia 06/11/2022   recovered   Pressure injury of skin 06/21/2021   Pulmonary embolism (HCC)    Sepsis (HCC)    Thrombocytopenia (HCC)    Urinary tract infection 09/13/2021   Past Surgical History:  Procedure Laterality Date   CATARACT EXTRACTION W/PHACO Right 06/04/2022   Procedure: CATARACT EXTRACTION PHACO AND INTRAOCULAR LENS PLACEMENT (IOC) COMPLICATED RIGHT  31.83  02:17.4;  Surgeon: Galen Manila, MD;  Location: [facility];  Service: Ophthalmology;  Laterality: Right;   CATARACT EXTRACTION W/PHACO Left 07/09/2022   Procedure: CATARACT EXTRACTION PHACO AND INTRAOCULAR LENS PLACEMENT (IOC);  Surgeon: Galen Manila, MD;  Location: [facility];  Service: Ophthalmology;  Laterality: Left;  22.75 1:40.3   COLONOSCOPY N/A 06/21/2021   Procedure: COLONOSCOPY;  Surgeon: Toledo, Boykin Nearing, MD;  Location: [facility] ENDOSCOPY;  Service:  Gastroenterology;  Laterality: N/A;   CYSTOSCOPY W/ URETERAL STENT PLACEMENT Left 09/11/2021   Procedure: CYSTOSCOPY WITH RETROGRADE PYELOGRAM/URETERAL STENT PLACEMENT;  Surgeon: Riki Altes, MD;  Location: [facility] ORS;  Service: Urology;   Laterality: Left;   CYSTOSCOPY W/ URETERAL STENT PLACEMENT Left 11/17/2021   Procedure: CYSTOSCOPY WITH STENT REPLACEMENT;  Surgeon: Bjorn Pippin, MD;  Location: [facility] OR[facility];  Service: Urology;  Laterality: Left;   CYSTOSCOPY WITH FULGERATION  11/17/2021   Procedure: CYSTOSCOPY WITH FULGERATION;  Surgeon: Bjorn Pippin, MD;  Location: [facility];  Service: Urology;;   CYSTOSCOPY WITH STENT PLACEMENT Left 10/17/2021   Procedure: CYSTOSCOPY WITH STENT PLACEMENT;  Surgeon: Riki Altes, MD;  Location: [facility];  Service: Urology;  Laterality: Left;   CYSTOSCOPY WITH URETEROSCOPY, STONE BASKETRY AND STENT PLACEMENT Left 10/16/2021   Procedure: CYSTOSCOPY WITH URETEROSCOPY  AND STENT REMOVAL;  Surgeon: Riki Altes, MD;  Location: [facility];  Service: Urology;  Laterality: Left;   ENTEROSCOPY N/A 08/17/2021   Procedure: ENTEROSCOPY;  Surgeon: Toney Reil, MD;  Location: [facility]COPY;  Service: Gastroenterology;  Laterality: N/A;   ESOPHAGOGASTRODUODENOSCOPY N/A 06/21/2021   Procedure: ESOPHAGOGASTRODUODENOSCOPY (EGD);  Surgeon: Toledo, Boykin Nearing, MD;  Location: [facility];  Service: Gastroenterology;  Laterality: N/A;   ESOPHAGOGASTRODUODENOSCOPY (EGD) WITH PROPOFOL N/A 08/17/2021   Procedure: ESOPHAGOGASTRODUODENOSCOPY (EGD) WITH PROPOFOL;  Surgeon: Toney Reil, MD;  Location: [facility] ENDOSCOPY;  Service: Gastroenterology;  Laterality: N/A;   ESOPHAGOGASTRODUODENOSCOPY (EGD) WITH PROPOFOL N/A 10/14/2021   Procedure: ESOPHAGOGASTRODUODENOSCOPY (EGD) WITH PROPOFOL;  Surgeon: Midge Minium, MD;  Location: [facility];  Service: Endoscopy;  Laterality: N/A;   ESOPHAGOGASTRODUODENOSCOPY (EGD) WITH PROPOFOL N/A 02/01/2022   Procedure: ESOPHAGOGASTRODUODENOSCOPY (EGD) WITH PROPOFOL;  Surgeon: Toney Reil, MD;  Location: [facility] END[facility]COPY;  Service: Gastroenterology;  Laterality: N/A;   ESOPHAGOGASTRODUODENOSCOPY (EGD) WITH PROPOFOL N/A 03/15/2022   Procedure: ESOPHAGOGASTRODUODENOSCOPY (EGD)  WITH PROPOFOL;  Surgeon: Wyline Mood, MD;  Location: [facility] ENDOSCOPY;  Service: Gastroenterology;  Laterality: N/A;   ESOPHAGOGASTRODUODENOSCOPY (EGD) WITH PROPOFOL N/A 04/18/2022   Procedure: ESOPHAGOGASTRODUODENOSCOPY (EGD) WITH PROPOFOL;  Surgeon: Toney Reil, MD;  Location: [facility] END[facility]COPY;  Service: Gastroenterology;  Laterality: N/A;   ESOPHAGOGASTRODUODENOSCOPY (EGD) WITH PROPOFOL N/A 08/09/2022   Procedure: ESOPHAGOGASTRODUODENOSCOPY (EGD) WITH PROPOFOL;  Surgeon: Wyline Mood, MD;  Location: [facility];  Service: Gastroenterology;  Laterality: N/A;   GIVENS CAPSULE STUDY N/A 06/22/2021   Procedure: GIVENS CAPSULE STUDY;  Surgeon: Toledo, Boykin Nearing, MD;  Location: [facility];  Service: Gastroenterology;  Laterality: N/A;   IR NEPHROSTOMY EXCHANGE LEFT  02/25/2022   IR NEPHROSTOMY EXCHANGE LEFT  03/27/2022   IR NEPHROSTOMY EXCHANGE LEFT  05/22/2022   IR NEPHROSTOMY EXCHANGE LEFT  07/24/2022   IR NEPHROSTOMY EXCHANGE LEFT  09/24/2022   IR NEPHROSTOMY EXCHANGE LEFT  11/21/2022   IR NEPHROSTOMY EXCHANGE LEFT  02/17/2023   IR NEPHROSTOMY EXCHANGE LEFT  04/06/2023   IR NEPHROSTOMY EXCHANGE LEFT  05/27/2023   IR NEPHROSTOMY PLACEMENT LEFT  12/27/2021   IVC FILTER INSERTION N/A 08/18/2020   Procedure: IVC FILTER INSERTION;  Surgeon: Renford Dills, MD;  Location: [facility];  Service: Cardiovascular;  Laterality: N/A;   PERIPHERAL VASCULAR THROMBECTOMY Bilateral 01/03/2021   Procedure: PERIPHERAL VASCULAR THROMBECTOMY;  Surgeon: Annice Needy, MD;  Location: [facility];  Service: Cardiovascular;  Laterality: Bilateral;   PULMONARY THROMBECTOMY N/A 08/18/2020   Procedure: PULMONARY THROMBECTOMY;  Surgeon: Renford Dills, MD;  Location: [facility];  Service: Cardiovascular;  Laterality: N/A;  TUBAL LIGATION     VISCERAL ANGIOGRAPHY N/A 07/18/2021   Procedure: VISCERAL ANGIOGRAPHY;  Surgeon: Annice Needy, MD;  Location: [facility];  Service:  Cardiovascular;  Laterality: N/A;   Social History:  reports that she has never smoked. She has never used smokeless tobacco. She reports that she does not currently use alcohol. She reports that she does not use drugs.  No Known Allergies  Family History  Problem Relation Age of Onset   Hypertension Mother    Diabetes Mother    Diabetes Father    Hypertension Father    High Cholesterol Father    Congestive Heart Failure Father    Breast cancer Cousin     Prior to Admission medications   Medication Sig Start Date End Date Taking? Authorizing Provider  acetaminophen (TYLENOL) 500 MG tablet Take 500 mg by mouth every 6 (six) hours as needed for mild pain or fever.    [provider]  albuterol (VENTOLIN HFA) 108 (90 Base) MCG/ACT inhaler Inhale 2 puffs into the lungs every 6 (six) hours as needed for wheezing or shortness of breath. Patient taking differently: Inhale 2 puffs into the lungs every 4 (four) hours as needed for wheezing or shortness of breath. 10/26/22   Marrion Coy, MD  Ascorbic Acid (VITAMIN C) 1000 MG tablet Take 1,000 mg by mouth daily.    [provider]  cyanocobalamin 100 MCG tablet Take 100 mcg by mouth daily.    [provider]  FEROSUL 325 (65 Fe) MG tablet Take 325 mg by mouth daily with breakfast. 08/14/22   [provider]  fluticasone furoate-vilanterol (BREO ELLIPTA) 100-25 MCG/ACT AEPB Inhale 1 puff into the lungs daily.    [provider]  furosemide (LASIX) 20 MG tablet Take 20 mg by mouth daily. 04/15/23   [provider]  Multiple Vitamin (MULTIVITAMIN WITH MINERALS) TABS tablet Take 1 tablet by mouth daily. 09/12/20   Standley Brooking, MD  pantoprazole (PROTONIX) 40 MG tablet Take 1 tablet (40 mg total) by mouth 2 (two) times daily. 02/12/23 06/19/23  Arnetha Courser, MD  rosuvastatin (CRESTOR) 10 MG tablet Take 1 tablet (10 mg total) by mouth daily. 01/21/23   Delma Freeze, FNP  simethicone (MYLICON) 80 MG  chewable tablet Chew 1 tablet (80 mg total) by mouth every 6 (six) hours as needed for flatulence. 04/08/23   Kathrynn Running, MD    Physical Exam: Vitals:   07/23/23 1902 07/23/23 2125 07/24/23 0438  BP: 100/60 119/85 (!) 176/68  Pulse: 91 95 (!) 110  Resp: 20 20 (!) 22  Temp: 98.3 F (36.8 C) 98.3 F (36.8 C)   TempSrc: Oral Oral   SpO2: 100% 99% 100%   Physical Exam Vitals and nursing note reviewed.  Constitutional:      General: She is not in acute distress. HENT:     Head: Normocephalic and atraumatic.  Cardiovascular:     Rate and Rhythm: Normal rate and regular rhythm.     Heart sounds: Normal heart sounds.  Pulmonary:     Effort: Pulmonary effort is normal.     Breath sounds: Normal breath sounds.  Abdominal:     Palpations: Abdomen is soft.     Tenderness: There is no abdominal tenderness.  Neurological:     Mental Status: Mental status is at baseline.     Labs on Admission: I have personally reviewed following labs and imaging studies  CBC: Recent Labs  Lab 07/23/23 1910  WBC 5.5  NEUTROABS 4.4  HGB 9.9*  HCT 32.9*  MCV 96.5  PLT 266   Basic Metabolic Panel: Recent Labs  Lab 07/23/23 1910  NA 136  K 4.3  CL 103  CO2 20*  GLUCOSE 170*  BUN 24*  CREATININE 0.99  CALCIUM 8.5*   GFR: CrCl cannot be calculated (Unknown ideal weight.). Liver Function Tests: Recent Labs  Lab 07/23/23 1910  AST 49*  ALT 18  ALKPHOS 49  BILITOT 1.1  PROT 6.7  ALBUMIN 3.2*   No results for input(s): "LIPASE", "AMYLASE" in the last 168 hours. No results for input(s): "AMMONIA" in the last 168 hours. Coagulation Profile: No results for input(s): "INR", "PROTIME" in the last 168 hours. Cardiac Enzymes: No results for input(s): "CKTOTAL", "CKMB", "CKMBINDEX", "TROPONINI" in the last 168 hours. BNP (last 3 results) No results for input(s): "PROBNP" in the last 8760 hours. HbA1C: No results for input(s): "HGBA1C" in the last 72 hours. CBG: No results  for input(s): "GLUCAP" in the last 168 hours. Lipid Profile: No results for input(s): "CHOL", "HDL", "LDLCALC", "TRIG", "CHOLHDL", "LDLDIRECT" in the last 72 hours. Thyroid Function Tests: No results for input(s): "TSH", "T4TOTAL", "FREET4", "T3FREE", "THYROIDAB" in the last 72 hours. Anemia Panel: No results for input(s): "VITAMINB12", "FOLATE", "FERRITIN", "TIBC", "IRON", "RETICCTPCT" in the last 72 hours. Urine analysis:    Component Value Date/Time   COLORURINE YELLOW (A) 07/24/2023 0048   APPEARANCEUR TURBID (A) 07/24/2023 0048   APPEARANCEUR Cloudy (A) 01/03/2022 1443   LABSPEC 1.027 07/24/2023 0048   PHURINE 5.0 07/24/2023 0048   GLUCOSEU NEGATIVE 07/24/2023 0048   HGBUR SMALL (A) 07/24/2023 0048   BILIRUBINUR NEGATIVE 07/24/2023 0048   BILIRUBINUR Negative 01/03/2022 1443   KETONESUR NEGATIVE 07/24/2023 0048   PROTEINUR 100 (A) 07/24/2023 0048   NITRITE NEGATIVE 07/24/2023 0048   LEUKOCYTESUR LARGE (A) 07/24/2023 0048    Radiological Exams on Admission: CT Angio Chest PE W and/or Wo Contrast Result Date: 07/24/2023 CLINICAL DATA:  Chest pain, shortness of breath, history of PE. CHF. Left lower quadrant abdominal pain, diarrhea. EXAM: CT ANGIOGRAPHY CHEST CT ABDOMEN AND PELVIS WITH CONTRAST TECHNIQUE: Multidetector CT imaging of the chest was performed using the standard protocol during bolus administration of intravenous contrast. Multiplanar CT image reconstructions and MIPs were obtained to evaluate the vascular anatomy. Multidetector CT imaging of the abdomen and pelvis was performed using the standard protocol during bolus administration of intravenous contrast. RADIATION DOSE REDUCTION: This exam was performed according to the departmental dose-optimization program which includes automated exposure control, adjustment of the mA and/or kV according to patient size and/or use of iterative reconstruction technique. CONTRAST:  OMNIPAQUE IOHEXOL 350 MG/ML SOLN COMPARISON:  CT  chest dated 06/18/2023. CT abdomen/pelvis dated 04/07/2023. FINDINGS: CTA CHEST FINDINGS Cardiovascular: Satisfactory opacification the bilateral pulmonary arteries to the segmental level. No evidence of pulmonary embolism. Although not tailored for evaluation of the thoracic aorta, there is no evidence of thoracic aortic aneurysm or dissection. Mild atherosclerotic calcifications of the aortic arch. Cardiomegaly.  No pericardial effusion. Mediastinum/Nodes: No suspicious mediastinal lymphadenopathy. Visualized thyroid is unremarkable. Lungs/Pleura: Ground-glass opacity/mosaic attenuation in the lungs bilaterally, chronic, suggesting interstitial lung disease such as hypersensitivity pneumonitis. Mild platelike atelectasis in the right lower lobe. Mild atelectasis/scarring in the left upper lobe. No frank interstitial edema. No suspicious pulmonary nodules, noting motion degradation. No pleural effusion or pneumothorax. Musculoskeletal: Degenerative changes of the visualized thoracolumbar spine. Review of the MIP images confirms the above findings. CT ABDOMEN and PELVIS  FINDINGS Hepatobiliary: Liver is within normal limits. Gallbladder is unremarkable. No intrahepatic or extrahepatic ductal dilatation. Pancreas: Within normal limits Spleen: Within normal limits. Adrenals/Urinary Tract: Adrenal glands are within normal limits. Right kidney is within normal limits. Mild left renal atrophy. Indwelling percutaneous nephrostomy. No hydronephrosis. Bladder is underdistended with mild perivesical stranding (series 4/image 80). Stomach/Bowel: Stomach is within normal limits. No evidence of bowel obstruction. Normal appendix (series 4/image 65). No colonic wall thickening or inflammatory changes. Vascular/Lymphatic: No evidence of abdominal aortic aneurysm. Atherosclerotic calcifications of the abdominal aorta and branch vessels, although vessels remain patent. IVC filter. No suspicious abdominopelvic lymphadenopathy.  Reproductive: Uterus is within normal limits. No adnexal masses. Other: No abdominopelvic ascites. Mild body wall edema. Musculoskeletal: Mild degenerative changes of the lumbar spine. Review of the MIP images confirms the above findings. IMPRESSION: No evidence of pulmonary embolism. Cardiomegaly.  No frank interstitial edema. Mild left renal atrophy. Indwelling percutaneous nephrostomy. No hydronephrosis. Mild perivesical stranding, suggesting cystitis. Aortic Atherosclerosis (ICD10-I70.0). Electronically Signed   By: Charline Bills M.D.   On: 07/24/2023 00:27   CT ABDOMEN PELVIS W CONTRAST Result Date: 07/24/2023 CLINICAL DATA:  Chest pain, shortness of breath, history of PE. CHF. Left lower quadrant abdominal pain, diarrhea. EXAM: CT ANGIOGRAPHY CHEST CT ABDOMEN AND PELVIS WITH CONTRAST TECHNIQUE: Multidetector CT imaging of the chest was performed using the standard protocol during bolus administration of intravenous contrast. Multiplanar CT image reconstructions and MIPs were obtained to evaluate the vascular anatomy. Multidetector CT imaging of the abdomen and pelvis was performed using the standard protocol during bolus administration of intravenous contrast. RADIATION DOSE REDUCTION: This exam was performed according to the departmental dose-optimization program which includes automated exposure control, adjustment of the mA and/or kV according to patient size and/or use of iterative reconstruction technique. CONTRAST:  OMNIPAQUE IOHEXOL 350 MG/ML SOLN COMPARISON:  CT chest dated 06/18/2023. CT abdomen/pelvis dated 04/07/2023. FINDINGS: CTA CHEST FINDINGS Cardiovascular: Satisfactory opacification the bilateral pulmonary arteries to the segmental level. No evidence of pulmonary embolism. Although not tailored for evaluation of the thoracic aorta, there is no evidence of thoracic aortic aneurysm or dissection. Mild atherosclerotic calcifications of the aortic arch. Cardiomegaly.  No pericardial  effusion. Mediastinum/Nodes: No suspicious mediastinal lymphadenopathy. Visualized thyroid is unremarkable. Lungs/Pleura: Ground-glass opacity/mosaic attenuation in the lungs bilaterally, chronic, suggesting interstitial lung disease such as hypersensitivity pneumonitis. Mild platelike atelectasis in the right lower lobe. Mild atelectasis/scarring in the left upper lobe. No frank interstitial edema. No suspicious pulmonary nodules, noting motion degradation. No pleural effusion or pneumothorax. Musculoskeletal: Degenerative changes of the visualized thoracolumbar spine. Review of the MIP images confirms the above findings. CT ABDOMEN and PELVIS FINDINGS Hepatobiliary: Liver is within normal limits. Gallbladder is unremarkable. No intrahepatic or extrahepatic ductal dilatation. Pancreas: Within normal limits Spleen: Within normal limits. Adrenals/Urinary Tract: Adrenal glands are within normal limits. Right kidney is within normal limits. Mild left renal atrophy. Indwelling percutaneous nephrostomy. No hydronephrosis. Bladder is underdistended with mild perivesical stranding (series 4/image 80). Stomach/Bowel: Stomach is within normal limits. No evidence of bowel obstruction. Normal appendix (series 4/image 65). No colonic wall thickening or inflammatory changes. Vascular/Lymphatic: No evidence of abdominal aortic aneurysm. Atherosclerotic calcifications of the abdominal aorta and branch vessels, although vessels remain patent. IVC filter. No suspicious abdominopelvic lymphadenopathy. Reproductive: Uterus is within normal limits. No adnexal masses. Other: No abdominopelvic ascites. Mild body wall edema. Musculoskeletal: Mild degenerative changes of the lumbar spine. Review of the MIP images confirms the above findings. IMPRESSION: No evidence of  pulmonary embolism. Cardiomegaly.  No frank interstitial edema. Mild left renal atrophy. Indwelling percutaneous nephrostomy. No hydronephrosis. Mild perivesical stranding,  suggesting cystitis. Aortic Atherosclerosis (ICD10-I70.0). Electronically Signed   By: Charline Bills M.D.   On: 07/24/2023 00:27   DG Chest Portable 1 View Result Date: 07/23/2023 CLINICAL DATA:  Shortness of breath and chest pain beginning this morning. History of CHF. Recent weight gain. EXAM: PORTABLE CHEST 1 VIEW COMPARISON:  06/18/2023 FINDINGS: Shallow inspiration. Normal heart size and pulmonary vascularity. No focal airspace disease or consolidation in the lungs. No blunting of costophrenic angles. No pneumothorax. Mediastinal contours appear intact. Calcification of the aorta. Degenerative changes in the spine and shoulders. IMPRESSION: No active disease. Electronically Signed   By: Burman Nieves M.D.   On: 07/23/2023 20:04     Data Reviewed: Relevant notes from primary care and specialist visits, past discharge summaries as available in EHR, including Care Everywhere. Prior diagnostic testing as pertinent to current admission diagnoses Updated medications and problem lists for reconciliation ED course, including vitals, labs, imaging, treatment and response to treatment Triage notes, nursing and pharmacy notes and ED provider's notes Notable results as noted in HPI   Assessment and Plan: * Complicated urinary tract infection Rocephin based on past sensitivities Follow cultures    SOB (shortness of breath):  CTA negative for PE.  Patient received 1 dose of Rocephin and azithromycin, will hold off antibiotics now.  He does not have oxygen desaturation.    COPD (chronic obstructive pulmonary disease) (HCC):  -Bronchodilators as needed Mucinex   Chronic diastolic CHF (congestive heart failure) (HCC):  Compensated 2D echo 06/2023 showed G1 DD LVEF 50 to 55% BNP normal  -Hold Lasix   Nephrostomy status (HCC): pt has hx of hydronephrosis of left kidney with left nephrostomy status [facility]):  Patient reports dysuria, will need to rule out recurrent UTI. -Patient received 1  dose of Rocephin  Follow cultures    HTN (hypertension):  Continue home meds  IDA (iron deficiency anemia):  -Continue iron supplement   Dyslipidemia -Crestor   Obesity (BMI 30-39.9): Body weight 86.2 kg, BMI 38.38 -Encourage losing weight, -Exercise and healthy diet    DVT prophylaxis: Lovenox  Consults: none  Advance Care Planning:   Code Status: Prior   Family Communication: none  Disposition Plan: Back to previous home environment  Severity of Illness: The appropriate patient status for this patient is INPATIENT. Inpatient status is judged to be reasonable and necessary in order to provide the required intensity of service to ensure the patient's safety. The patient's presenting symptoms, physical exam findings, and initial radiographic and laboratory data in the context of their chronic comorbidities is felt to place them at high risk for further clinical deterioration. Furthermore, it is not anticipated that the patient will be medically stable for discharge from the hospital within 2 midnights of admission.   * I certify that at the point of admission it is my clinical judgment that the patient will require inpatient hospital care spanning beyond 2 midnights from the point of admission due to high intensity of service, high risk for further deterioration and high frequency of surveillance required.*  Author: Andris Baumann, MD 07/24/2023 5:00 AM  For on call review [email or web address].

## 2023-07-25 DIAGNOSIS — N390 Urinary tract infection, site not specified: Secondary | ICD-10-CM | POA: Diagnosis not present

## 2023-07-25 LAB — BASIC METABOLIC PANEL
Anion gap: 11 (ref 5–15)
BUN: 24 mg/dL — ABNORMAL HIGH (ref 8–23)
CO2: 24 mmol/L (ref 22–32)
Calcium: 8.3 mg/dL — ABNORMAL LOW (ref 8.9–10.3)
Chloride: 106 mmol/L (ref 98–111)
Creatinine, Ser: 0.89 mg/dL (ref 0.44–1.00)
GFR, Estimated: >60 mL/min (ref >60)
Glucose, Bld: 174 mg/dL — ABNORMAL HIGH (ref 70–99)
Potassium: 3.4 mmol/L — ABNORMAL LOW (ref 3.5–5.1)
Sodium: 141 mmol/L (ref 135–145)

## 2023-07-25 LAB — CBC
HCT: 26 % — ABNORMAL LOW (ref 36.0–46.0)
Hemoglobin: 7.9 g/dL — ABNORMAL LOW (ref 12.0–15.0)
MCH: 27.9 pg (ref 26.0–34.0)
MCHC: 30.4 g/dL (ref 30.0–36.0)
MCV: 91.9 fL (ref 80.0–100.0)
Platelets: 208 10*3/uL (ref 150–400)
RBC: 2.83 MIL/uL — ABNORMAL LOW (ref 3.87–5.11)
RDW: 16.9 % — ABNORMAL HIGH (ref 11.5–15.5)
WBC: 4.3 10*3/uL (ref 4.0–10.5)
nRBC: 0 % (ref 0.0–0.2)

## 2023-07-25 NOTE — ED Notes (Signed)
Informed RN bed assigned 

## 2023-07-25 NOTE — Progress Notes (Addendum)
Progress Note   Patient: Courtney Mack ZOX:[id number] DOB: 1956-06-25 DOA: 07/23/2023     1 DOS: the patient was seen and examined on 07/25/2023   Brief hospital course: 68 yrs old female admitted for Complicated UTI.  Has left nephrostomy tube.  Presented with shortness of breath and chest pain, negative CTA chest, troponin   Assessment and Plan: Complicated urinary tract infection with left nephrostomy tube Continue IV Rocephin based on past sensitivities Consulted Urology: recommended to continue nephrostomy tube changes with interventional radiology every 8 weeks as scheduled. Patient has been referred previously to [facility] for consideration of left radical nephrectomy for more definitive management of her recurrent infections and chronic left-sided nephrostomy tube Will check with IR if they will be able to replace nephrostomy tube during this hospitalization Repeat labs Follow cultures   SOB (shortness of breath): resolved CTA negative for PE.   Patient received 1 dose of azithromycin, will hold off treating for PNA.   No oxygen desaturation.    COPD (chronic obstructive pulmonary disease) (HCC):  -Bronchodilators as needed Mucinex   Chronic diastolic CHF (congestive heart failure) (HCC):  Compensated 2D echo 06/2023 showed G1 DD LVEF 50 to 55% BNP normal  -Hold Lasix   Nephrostomy status (HCC): pt has hx of hydronephrosis of left kidney with left nephrostomy status [facility] ([facility]Rh)):  CT abdomen: Mild left renal atrophy. Indwelling percutaneous nephrostomy. No hydronephrosis. Mild perivesical stranding, suggesting cystitis. Patient reports dysuria, will need to rule out recurrent UTI. -Patient received 1 dose of Rocephin  Follow cultures  HTN (hypertension):  Continue home meds   IDA (iron deficiency anemia):  -Continue iron supplement   Dyslipidemia -Crestor   Obesity (BMI 30-39.9): Body weight 86.2 kg, BMI 38.38 -Encourage losing weight, -Exercise and healthy diet      DVT prophylaxis: Lovenox   Consults: none   Advance Care Planning:   Code Status: Prior    Family Communication: none   Disposition Plan: Back to previous home environment* Complicated urinary tract infection Rocephin based on past sensitivities Follow cultures       Subjective: Still has pain in left flank> right flank. Has dysuria. Has chills Denies Dyspnea, cough, headache, dizziness, nausea/vomiting, Diarrhea  Physical Exam: Vitals:   07/25/23 0200 07/25/23 0300 07/25/23 0641 07/25/23 0935  BP: (!) 112/48 (!) 120/52 (!) 107/47 (!) 125/51  Pulse: 71 80 84 71  Resp:   20 19  Temp:   97.8 F (36.6 C)   TempSrc:      SpO2: 96% 98% 97% 95%  Weight:      Height:       Physical Exam Constitutional:      General: She is not in acute distress.    Appearance: She is ill-appearing.  HENT:     Head: Normocephalic and atraumatic.     Nose: Nose normal. No congestion.     Mouth/Throat:     Mouth: Mucous membranes are moist.     Pharynx: Oropharynx is clear. No oropharyngeal exudate.  Eyes:     Extraocular Movements: Extraocular movements intact.     Conjunctiva/sclera: Conjunctivae normal.  Cardiovascular:     Rate and Rhythm: Normal rate and regular rhythm.     Heart sounds: Normal heart sounds.  Pulmonary:     Effort: Pulmonary effort is normal.     Breath sounds: Normal breath sounds.  Abdominal:     General: Abdomen is flat.     Palpations: Abdomen is soft.     Tenderness: There is  right CVA tenderness and left CVA tenderness.     Comments: Left CVA tenderness >> Rt CVA tenderness Left Nephrostomy tube intact--> has turbid urine outflow  Musculoskeletal:        General: No swelling or tenderness. Normal range of motion.     Cervical back: Normal range of motion and neck supple.     Right lower leg: No edema.     Left lower leg: No edema.  Skin:    General: Skin is warm.     Capillary Refill: Capillary refill takes 2 to 3 seconds.     Findings: No rash.   Neurological:     General: No focal deficit present.     Cranial Nerves: No cranial nerve deficit.     Sensory: No sensory deficit.     Motor: No weakness.  Psychiatric:        Mood and Affect: Mood normal.        Thought Content: Thought content normal.     Data Reviewed:  Latest Reference Range & Units 07/24/23 00:48  Appearance CLEAR  TURBID !  Bilirubin Urine NEGATIVE  NEGATIVE  Color, Urine YELLOW  YELLOW !  Glucose, UA NEGATIVE mg/dL NEGATIVE  Hgb urine dipstick NEGATIVE  SMALL !  Ketones, ur NEGATIVE mg/dL NEGATIVE  Leukocytes,Ua NEGATIVE  LARGE !  Nitrite NEGATIVE  NEGATIVE  pH 5.0 - 8.0  5.0  Protein NEGATIVE mg/dL 416 !  Specific Gravity, Urine 1.005 - 1.030  1.027  Bacteria, UA NONE SEEN  MANY !  Mucus  PRESENT  RBC / HPF 0 - 5 RBC/hpf 21-50  Squamous Epithelial / HPF 0 - 5 /HPF 0-5  WBC Clumps  PRESENT  WBC, UA 0 - 5 WBC/hpf >50    Latest Reference Range & Units 07/23/23 19:10  Sodium 135 - 145 mmol/L 136  Potassium 3.5 - 5.1 mmol/L 4.3  Chloride 98 - 111 mmol/L 103  CO2 22 - 32 mmol/L 20 (L)  Glucose 70 - 99 mg/dL 606 (H)  BUN 8 - 23 mg/dL 24 (H)  Creatinine 3.01 - 1.00 mg/dL 6.01  Calcium 8.9 - 09.3 mg/dL 8.5 (L)  Anion gap 5 - 15  13  Alkaline Phosphatase 38 - 126 U/L 49  Albumin 3.5 - 5.0 g/dL 3.2 (L)  AST 15 - 41 U/L 49 (H)  ALT 0 - 44 U/L 18  Total Protein 6.5 - 8.1 g/dL 6.7  Total Bilirubin 0.0 - 1.2 mg/dL 1.1  GFR, Estimated >23 mL/min >60  B Natriuretic Peptide 0.0 - 100.0 pg/mL 35.0  Troponin I (High Sensitivity) <18 ng/L 11  WBC 4.0 - 10.5 K/uL 5.5  RBC 3.87 - 5.11 MIL/uL 3.41 (L)  Hemoglobin 12.0 - 15.0 g/dL 9.9 (L)  HCT 55.7 - 32.2 % 32.9 (L)  MCV 80.0 - 100.0 fL 96.5  MCH 26.0 - 34.0 pg 29.0  MCHC 30.0 - 36.0 g/dL 02.5  RDW 42.7 - 06.2 % 16.7 (H)  Platelets 150 - 400 K/uL 266  nRBC 0.0 - 0.2 % 0.0  Neutrophils % 81  Lymphocytes % 12  Monocytes Relative % 5  Eosinophil % 2  Basophil % 0  Immature Granulocytes % 0  NEUT# 1.7 -  7.7 K/uL 4.4  Lymphs Abs 0.7 - 4.0 K/uL 0.7  Monocyte # 0.1 - 1.0 K/uL 0.3  Eosinophils Absolute 0.0 - 0.5 K/uL 0.1  Basophils Absolute 0.0 - 0.1 K/uL 0.0  Abs Immature Granulocytes 0.00 - 0.07 K/uL 0.02  (L): Data is abnormally  low (H): Data is abnormally high  Family Communication: Explained plan of care with the pt  Disposition: Status is: Inpatient   Planned Discharge Destination: Home    Time spent: 35 minutes  Author: Ernestene Mention, MD 07/25/2023 9:47 AM  For on call review [email or web address].

## 2023-07-26 DIAGNOSIS — N390 Urinary tract infection, site not specified: Secondary | ICD-10-CM | POA: Diagnosis not present

## 2023-07-26 LAB — CBC
HCT: 28 % — ABNORMAL LOW (ref 36.0–46.0)
Hemoglobin: 8.5 g/dL — ABNORMAL LOW (ref 12.0–15.0)
MCH: 28 pg (ref 26.0–34.0)
MCHC: 30.4 g/dL (ref 30.0–36.0)
MCV: 92.1 fL (ref 80.0–100.0)
Platelets: 224 10*3/uL (ref 150–400)
RBC: 3.04 MIL/uL — ABNORMAL LOW (ref 3.87–5.11)
RDW: 16.7 % — ABNORMAL HIGH (ref 11.5–15.5)
WBC: 3.5 10*3/uL — ABNORMAL LOW (ref 4.0–10.5)
nRBC: 0 % (ref 0.0–0.2)

## 2023-07-26 LAB — BASIC METABOLIC PANEL
Anion gap: 7 (ref 5–15)
BUN: 22 mg/dL (ref 8–23)
CO2: 27 mmol/L (ref 22–32)
Calcium: 8.2 mg/dL — ABNORMAL LOW (ref 8.9–10.3)
Chloride: 101 mmol/L (ref 98–111)
Creatinine, Ser: 0.88 mg/dL (ref 0.44–1.00)
GFR, Estimated: >60 mL/min (ref >60)
Glucose, Bld: 119 mg/dL — ABNORMAL HIGH (ref 70–99)
Potassium: 3.3 mmol/L — ABNORMAL LOW (ref 3.5–5.1)
Sodium: 135 mmol/L (ref 135–145)

## 2023-07-26 NOTE — Progress Notes (Signed)
Progress Note   Patient: Courtney [id number] DOB: Jun 29, 1956 DOA: 07/23/2023     2 DOS: the patient was seen and examined on 07/26/2023   Brief hospital course: 68 yrs old female admitted for Complicated UTI.  Has left nephrostomy tube.  Presented with shortness of breath and chest pain, negative CTA chest, troponin   Assessment and Plan: Complicated urinary tract infection with left nephrostomy tube Continue IV Rocephin (today day#3) based on past sensitivities Consulted Urology: recommended to continue nephrostomy tube changes with interventional radiology every 8 weeks as scheduled. Patient has been referred previously to [facility] for consideration of left radical nephrectomy for more definitive management of her recurrent infections and chronic left-sided nephrostomy tube Will consult IR on Monday to replace nephrostomy tube during this hospitalization (not on schedule today) Repeat labs Follow cultures   SOB (shortness of breath): resolved CTA negative for PE.   Patient received 1 dose of azithromycin, will hold off treating for PNA.   No oxygen desaturation.    COPD (chronic obstructive pulmonary disease) (HCC):  -Bronchodilators as needed Mucinex   Chronic diastolic CHF (congestive heart failure) (HCC):  Compensated 2D echo 06/2023 showed G1 DD LVEF 50 to 55% BNP normal  -Hold Lasix   Nephrostomy status (HCC): pt has hx of hydronephrosis of left kidney with left nephrostomy status [facility]):  CT abdomen: Mild left renal atrophy. Indwelling percutaneous nephrostomy. No hydronephrosis. Mild perivesical stranding, suggesting cystitis. Patient reports dysuria, will need to rule out recurrent UTI. -Patient received 1 dose of Rocephin  Follow cultures  HTN (hypertension):  Continue home meds   IDA (iron deficiency anemia):  -Continue iron supplement   Dyslipidemia -Crestor   Obesity (BMI 30-39.9): Body weight 86.2 kg, BMI 38.38 -Encourage losing weight, -Exercise  and healthy diet     DVT prophylaxis: Lovenox   Consults: none   Advance Care Planning:   Code Status: Prior    Family Communication: none   Disposition Plan: Back to previous home environment* Complicated urinary tract infection Rocephin based on past sensitivities Follow cultures       Subjective: Still has pain in left flank> right flank. Has dysuria. Has chills Denies Dyspnea, cough, headache, dizziness, nausea/vomiting, Diarrhea  Physical Exam: Vitals:   07/25/23 1554 07/25/23 1943 07/26/23 0308 07/26/23 0826  BP: 117/63 (!) 108/39 (!) 106/39 (!) 108/34  Pulse: 83 79 73 72  Resp: (!) 24 18 20 17   Temp: 98.3 F (36.8 C) 98.2 F (36.8 C) 98.7 F (37.1 C) 98.2 F (36.8 C)  TempSrc: Oral Oral Oral Oral  SpO2: 100% 98% 98% 96%  Weight:      Height:       Physical Exam Constitutional:      General: She is not in acute distress.    Appearance: She is ill-appearing.  HENT:     Head: Normocephalic and atraumatic.     Nose: Nose normal. No congestion.     Mouth/Throat:     Mouth: Mucous membranes are moist.     Pharynx: Oropharynx is clear. No oropharyngeal exudate.  Eyes:     Extraocular Movements: Extraocular movements intact.     Conjunctiva/sclera: Conjunctivae normal.  Cardiovascular:     Rate and Rhythm: Normal rate and regular rhythm.     Heart sounds: Normal heart sounds.  Pulmonary:     Effort: Pulmonary effort is normal.     Breath sounds: Normal breath sounds.  Abdominal:     General: Abdomen is flat.  Palpations: Abdomen is soft.     Tenderness: There is right CVA tenderness and left CVA tenderness.     Comments: Left CVA tenderness > Rt CVA tenderness Left Nephrostomy tube intact  Musculoskeletal:        General: No swelling or tenderness. Normal range of motion.     Cervical back: Normal range of motion and neck supple.     Right lower leg: No edema.     Left lower leg: No edema.  Skin:    General: Skin is warm.     Capillary Refill:  Capillary refill takes 2 to 3 seconds.     Findings: No rash.  Neurological:     General: No focal deficit present.     Cranial Nerves: No cranial nerve deficit.     Sensory: No sensory deficit.     Motor: No weakness.  Psychiatric:        Mood and Affect: Mood normal.        Thought Content: Thought content normal.     Data Reviewed:  Repeat Labs today:  Lebs from yesterday:  Latest Reference Range & Units 07/25/23 13:59  Sodium 135 - 145 mmol/L 141  Potassium 3.5 - 5.1 mmol/L 3.4 (L)  Chloride 98 - 111 mmol/L 106  CO2 22 - 32 mmol/L 24  Glucose 70 - 99 mg/dL 161 (H)  BUN 8 - 23 mg/dL 24 (H)  Creatinine 0.96 - 1.00 mg/dL 0.45  Calcium 8.9 - 40.9 mg/dL 8.3 (L)  Anion gap 5 - 15  11  GFR, Estimated >60 mL/min >60  WBC 4.0 - 10.5 K/uL 4.3  RBC 3.87 - 5.11 MIL/uL 2.83 (L)  Hemoglobin 12.0 - 15.0 g/dL 7.9 (L)  HCT 81.1 - 91.4 % 26.0 (L)  MCV 80.0 - 100.0 fL 91.9  MCH 26.0 - 34.0 pg 27.9  MCHC 30.0 - 36.0 g/dL 78.2  RDW 95.6 - 21.3 % 16.9 (H)  Platelets 150 - 400 K/uL 208  nRBC 0.0 - 0.2 % 0.0  (L): Data is abnormally low (H): Data is abnormally high  Family Communication: Explained plan of care with the pt  Disposition: Status is: Inpatient   Planned Discharge Destination: Home    Time spent: 35 minutes  Author: Ernestene Mention, MD 07/26/2023 11:43 AM  For on call review [email or web address].

## 2023-07-27 DIAGNOSIS — N390 Urinary tract infection, site not specified: Secondary | ICD-10-CM | POA: Diagnosis not present

## 2023-07-27 LAB — BASIC METABOLIC PANEL
Anion gap: 8 (ref 5–15)
BUN: 19 mg/dL (ref 8–23)
CO2: 25 mmol/L (ref 22–32)
Calcium: 8.2 mg/dL — ABNORMAL LOW (ref 8.9–10.3)
Chloride: 101 mmol/L (ref 98–111)
Creatinine, Ser: 0.84 mg/dL (ref 0.44–1.00)
GFR, Estimated: >60 mL/min (ref >60)
Glucose, Bld: 147 mg/dL — ABNORMAL HIGH (ref 70–99)
Potassium: 3.6 mmol/L (ref 3.5–5.1)
Sodium: 134 mmol/L — ABNORMAL LOW (ref 135–145)

## 2023-07-27 LAB — CBC
HCT: 26 % — ABNORMAL LOW (ref 36.0–46.0)
Hemoglobin: 8 g/dL — ABNORMAL LOW (ref 12.0–15.0)
MCH: 28.9 pg (ref 26.0–34.0)
MCHC: 30.8 g/dL (ref 30.0–36.0)
MCV: 93.9 fL (ref 80.0–100.0)
Platelets: 210 10*3/uL (ref 150–400)
RBC: 2.77 MIL/uL — ABNORMAL LOW (ref 3.87–5.11)
RDW: 16.5 % — ABNORMAL HIGH (ref 11.5–15.5)
WBC: 3.8 10*3/uL — ABNORMAL LOW (ref 4.0–10.5)
nRBC: 0 % (ref 0.0–0.2)

## 2023-07-27 LAB — VITAMIN B12: Vitamin B-12: 526 pg/mL (ref 180–914)

## 2023-07-27 LAB — FERRITIN: Ferritin: 11 ng/mL (ref 11–307)

## 2023-07-27 LAB — IRON AND TIBC
Iron: 24 ug/dL — ABNORMAL LOW (ref 28–170)
Saturation Ratios: 8 % — ABNORMAL LOW (ref 10.4–31.8)
TIBC: 302 ug/dL (ref 250–450)
UIBC: 278 ug/dL

## 2023-07-27 MED ORDER — POTASSIUM CHLORIDE CRYS ER 20 MEQ PO TBCR
40.0000 meq | EXTENDED_RELEASE_TABLET | Freq: Once | ORAL | Status: AC
  Administered 2023-07-27: 40 meq via ORAL
  Filled 2023-07-27: qty 2

## 2023-07-27 NOTE — Plan of Care (Signed)

## 2023-07-27 NOTE — Progress Notes (Addendum)
Progress Note   Patient: Courtney Mack [id number] DOB: March 24, 1956 DOA: 07/23/2023     3 DOS: the patient was seen and examined on 07/27/2023   Brief hospital course: 68 yrs old female admitted for Complicated UTI.  Has left nephrostomy tube.  Presented with shortness of breath and chest pain, negative CTA chest, troponin   Assessment and Plan: Complicated urinary tract infection with left nephrostomy tube Continue IV Rocephin (today day#4) based on past sensitivities Consulted Urology: recommended to continue nephrostomy tube changes with interventional radiology every 8 weeks as scheduled. Patient has been referred previously to [facility] for consideration of left radical nephrectomy for more definitive management of her recurrent infections and chronic left-sided nephrostomy tube Will consult our IR on Monday to replace nephrostomy tube during this hospitalization--> Will presumptively keep NPO after MN Has leukopenia Follow cultures   SOB (shortness of breath): resolved CTA negative for PE.   Resp panel negative Patient received 1 dose of azithromycin, will hold off treating for PNA.   No oxygen desaturation.    COPD (chronic obstructive pulmonary disease) (HCC):  -Bronchodilators as needed Mucinex   Chronic diastolic CHF (congestive heart failure) (HCC):  Compensated 2D echo 06/2023 showed G1 DD LVEF 50 to 55% BNP normal  Cont Lasix   Nephrostomy status (HCC): pt has hx of hydronephrosis of left kidney with left nephrostomy status [facility]):  CT abdomen: Mild left renal atrophy. Indwelling percutaneous nephrostomy. No hydronephrosis. Mild perivesical stranding, suggesting cystitis. Patient reports dysuria, will need to rule out recurrent UTI. -Patient received 1 dose of Rocephin  Follow cultures  Hypokalemia: replaced by PO K+  Chronic Iron deficiency Anemia: Hb stable at 8.5 Reported that she gets IV Iron infusions as out pt usually Continue iron supplement Obtain Iron  profile and B12 level F/u Hb  HTN (hypertension):  Continue home meds  Dyslipidemia -Crestor   Obesity (BMI 30-39.9): Body weight 86.2 kg, BMI 38.38 -Encourage losing weight, -Exercise and healthy diet     DVT prophylaxis: Lovenox   Consults: none   Advance Care Planning:   Code Status: Prior    Family Communication: none   Disposition Plan: Back to previous home environment   Subjective: Still has pain in left flank Denies pain in right flank.  Denies fever, chills, chills, shortness of breath, cough, headache, dizziness, nausea/vomiting, Diarrhea  Physical Exam: Vitals:   07/26/23 0826 07/26/23 2128 07/27/23 0521 07/27/23 0824  BP: (!) 108/34 (!) 108/52 (!) 93/50 (!) 104/37  Pulse: 72 75 71 67  Resp: 17  18 18   Temp: 98.2 F (36.8 C) 98.8 F (37.1 C) 98.2 F (36.8 C)   TempSrc: Oral Oral Oral   SpO2: 96% 95% 96% 97%  Weight:      Height:       Physical Exam Constitutional:      General: She is not in acute distress.    Appearance: She is ill-appearing.  HENT:     Head: Normocephalic and atraumatic.     Nose: Nose normal. No congestion.     Mouth/Throat:     Mouth: Mucous membranes are moist.     Pharynx: Oropharynx is clear. No oropharyngeal exudate.  Eyes:     Extraocular Movements: Extraocular movements intact.     Conjunctiva/sclera: Conjunctivae normal.  Cardiovascular:     Rate and Rhythm: Normal rate and regular rhythm.     Heart sounds: Normal heart sounds.  Pulmonary:     Effort: Pulmonary effort is normal.  Breath sounds: Normal breath sounds.  Abdominal:     General: Abdomen is flat.     Palpations: Abdomen is soft.     Tenderness: There is right CVA tenderness and left CVA tenderness.     Comments: Left CVA tenderness + Left Nephrostomy tube intact  Musculoskeletal:        General: No swelling or tenderness. Normal range of motion.     Cervical back: Normal range of motion and neck supple.     Right lower leg: No edema.     Left  lower leg: No edema.  Skin:    General: Skin is warm.     Capillary Refill: Capillary refill takes 2 to 3 seconds.     Findings: No rash.  Neurological:     General: No focal deficit present.     Cranial Nerves: No cranial nerve deficit.     Sensory: No sensory deficit.     Motor: No weakness.  Psychiatric:        Mood and Affect: Mood normal.        Thought Content: Thought content normal.     Data Reviewed:  Repeat Labs today:  Labs from yesterday:   Latest Reference Range & Units 07/26/23 13:32  Sodium 135 - 145 mmol/L 135  Potassium 3.5 - 5.1 mmol/L 3.3 (L)  Chloride 98 - 111 mmol/L 101  CO2 22 - 32 mmol/L 27  Glucose 70 - 99 mg/dL 166 (H)  BUN 8 - 23 mg/dL 22  Creatinine 0.63 - 0.16 mg/dL 0.10  Calcium 8.9 - 93.2 mg/dL 8.2 (L)  Anion gap 5 - 15  7  GFR, Estimated >60 mL/min >60  WBC 4.0 - 10.5 K/uL 3.5 (L)  RBC 3.87 - 5.11 MIL/uL 3.04 (L)  Hemoglobin 12.0 - 15.0 g/dL 8.5 (L)  HCT 35.5 - 73.2 % 28.0 (L)  MCV 80.0 - 100.0 fL 92.1  MCH 26.0 - 34.0 pg 28.0  MCHC 30.0 - 36.0 g/dL 20.2  RDW 54.2 - 70.6 % 16.7 (H)  Platelets 150 - 400 K/uL 224  nRBC 0.0 - 0.2 % 0.0  (L): Data is abnormally low (H): Data is abnormally high  Family Communication: Explained plan of care with the pt  Disposition: Status is: Inpatient   Planned Discharge Destination: Home    Time spent: 35 minutes  Author: Ernestene Mention, MD 07/27/2023 11:20 AM  For on call review [email or web address].

## 2023-07-27 NOTE — Plan of Care (Signed)

## 2023-07-28 ENCOUNTER — Inpatient Hospital Stay: Payer: 59

## 2023-07-28 ENCOUNTER — Inpatient Hospital Stay: Payer: 59 | Admitting: Radiology

## 2023-07-28 DIAGNOSIS — N390 Urinary tract infection, site not specified: Secondary | ICD-10-CM | POA: Diagnosis not present

## 2023-07-28 HISTORY — PX: IR NEPHROSTOMY EXCHANGE LEFT: IMG6069

## 2023-07-28 MED ORDER — IOHEXOL 300 MG/ML  SOLN
5.0000 mL | Freq: Once | INTRAMUSCULAR | Status: AC | PRN
  Administered 2023-07-28: 5 mL

## 2023-07-28 MED ORDER — ALUM & MAG HYDROXIDE-SIMETH 200-200-20 MG/5ML PO SUSP
30.0000 mL | Freq: Once | ORAL | Status: AC
  Administered 2023-07-28: 30 mL via ORAL
  Filled 2023-07-28: qty 30

## 2023-07-28 MED ORDER — LIDOCAINE HCL 1 % IJ SOLN
INTRAMUSCULAR | Status: AC
  Filled 2023-07-28: qty 20

## 2023-07-28 NOTE — Plan of Care (Signed)
  Problem: Education: Goal: Knowledge of General Education information will improve Description Including pain rating scale, medication(s)/side effects and non-pharmacologic comfort measures Outcome: Progressing   Problem: Health Behavior/Discharge Planning: Goal: Ability to manage health-related needs will improve Outcome: Progressing   

## 2023-07-28 NOTE — Care Management Important Message (Signed)
Important Message  Patient Details  Name: LISANNE COBB MRN: [id number] Date of Birth: [birth year]   Important Message Given:  Yes - Medicare IM     Sherilyn Banker 07/28/2023, 11:19 AM

## 2023-07-28 NOTE — Progress Notes (Signed)
Progress Note   Patient: Courtney Mack YTK:[id number] DOB: [birth year] DOA: 07/23/2023     4 DOS: the patient was seen and examined on 07/28/2023   Brief hospital course: 68 yrs old female admitted for Complicated UTI.  Has left nephrostomy tube.  Presented with shortness of breath and chest pain, negative CTA chest, troponin   Assessment and Plan: Complicated urinary tract infection with left nephrostomy tube Consulted Urology: recommended to continue nephrostomy tube changes with interventional radiology every 8 weeks as scheduled.  Consulted IR and replaced left nephrostomy tube today Pseudomonas and enterococcus from left nephrostomy cath sample is likely colonization-> clinically she is improving Completed IV Rocephin (today day#5). Discontinue IV Rocephin and observe off of antibiotics Has leukopenia Follow cultures   SOB (shortness of breath): resolved CTA negative for PE.   Resp panel negative Patient received 1 dose of azithromycin, will hold off treating for PNA.   No oxygen desaturation.    COPD (chronic obstructive pulmonary disease) (HCC):  -Bronchodilators as needed Mucinex   Chronic diastolic CHF (congestive heart failure) (HCC):  Compensated 2D echo 06/2023 showed G1 DD LVEF 50 to 55% BNP normal  Hold Lasix 20mg  PO given soft BP   Nephrostomy status (HCC): pt has hx of hydronephrosis of left kidney with left nephrostomy status [facility]):  CT abdomen: Mild left renal atrophy. Indwelling percutaneous nephrostomy. No hydronephrosis. Mild perivesical stranding, suggesting cystitis. Patient reports dysuria, will need to rule out recurrent UTI. -Patient received 1 dose of Rocephin  Follow cultures  Hypokalemia: replaced by PO K+  Chronic Iron deficiency Anemia: Hb stable at 8.0 Reported that she gets IV Iron infusions as out pt usually--> Given active infection will avoid IV Iron infusion F/u Hb  HTN (hypertension):  Continue home meds  Dyslipidemia -Crestor    Obesity (BMI 30-39.9): Body weight 86.2 kg, BMI 38.38 -Encourage losing weight, -Exercise and healthy diet     DVT prophylaxis: Lovenox   Consults: none   Advance Care Planning:   Code Status: Prior    Family Communication: none   Disposition Plan: Back to previous home environment   Subjective: Pain in left flank improving Denies pain in right flank.  Denies fever, chills, chills, shortness of breath, cough, headache, dizziness, nausea/vomiting, Diarrhea  Physical Exam: Vitals:   07/27/23 2047 07/27/23 2135 07/28/23 0439 07/28/23 0746  BP: (!) 89/51 (!) 109/53 (!) 95/50 (!) 105/57  Pulse: 75 74 77 81  Resp: 20  16 16   Temp: 98.1 F (36.7 C)  (!) 97.4 F (36.3 C) 98.6 F (37 C)  TempSrc: Oral  Oral Oral  SpO2: 98%  95% 100%  Weight:      Height:       Physical Exam Constitutional:      General: She is not in acute distress.    Appearance: She is ill-appearing.  HENT:     Head: Normocephalic and atraumatic.     Nose: Nose normal. No congestion.     Mouth/Throat:     Mouth: Mucous membranes are moist.     Pharynx: Oropharynx is clear. No oropharyngeal exudate.  Eyes:     Extraocular Movements: Extraocular movements intact.     Conjunctiva/sclera: Conjunctivae normal.  Cardiovascular:     Rate and Rhythm: Normal rate and regular rhythm.     Heart sounds: Normal heart sounds.  Pulmonary:     Effort: Pulmonary effort is normal.     Breath sounds: Normal breath sounds.  Abdominal:     General: Abdomen  is flat.     Palpations: Abdomen is soft.     Tenderness: There is right CVA tenderness and left CVA tenderness.     Comments: Left CVA tenderness + Left Nephrostomy tube intact  Musculoskeletal:        General: No swelling or tenderness. Normal range of motion.     Cervical back: Normal range of motion and neck supple.     Right lower leg: No edema.     Left lower leg: No edema.  Skin:    General: Skin is warm.     Capillary Refill: Capillary refill takes  2 to 3 seconds.     Findings: No rash.  Neurological:     General: No focal deficit present.     Cranial Nerves: No cranial nerve deficit.     Sensory: No sensory deficit.     Motor: No weakness.  Psychiatric:        Mood and Affect: Mood normal.        Thought Content: Thought content normal.     Data Reviewed:  Repeat Labs today:  Labs from yesterday:   Latest Reference Range & Units 07/26/23 13:32  Sodium 135 - 145 mmol/L 135  Potassium 3.5 - 5.1 mmol/L 3.3 (L)  Chloride 98 - 111 mmol/L 101  CO2 22 - 32 mmol/L 27  Glucose 70 - 99 mg/dL 308 (H)  BUN 8 - 23 mg/dL 22  Creatinine 6.57 - 8.46 mg/dL 9.62  Calcium 8.9 - 95.2 mg/dL 8.2 (L)  Anion gap 5 - 15  7  GFR, Estimated >60 mL/min >60  WBC 4.0 - 10.5 K/uL 3.5 (L)  RBC 3.87 - 5.11 MIL/uL 3.04 (L)  Hemoglobin 12.0 - 15.0 g/dL 8.5 (L)  HCT 84.1 - 32.4 % 28.0 (L)  MCV 80.0 - 100.0 fL 92.1  MCH 26.0 - 34.0 pg 28.0  MCHC 30.0 - 36.0 g/dL 40.1  RDW 02.7 - 25.3 % 16.7 (H)  Platelets 150 - 400 K/uL 224  nRBC 0.0 - 0.2 % 0.0  (L): Data is abnormally low (H): Data is abnormally high  Family Communication: Explained plan of care with the pt  Disposition: Status is: Inpatient   Planned Discharge Destination: Home    Time spent: 35 minutes  Author: Ernestene Mention, MD 07/28/2023 1:41 PM  For on call review [email or web address].

## 2023-07-28 NOTE — Plan of Care (Signed)

## 2023-07-29 DIAGNOSIS — N390 Urinary tract infection, site not specified: Secondary | ICD-10-CM | POA: Diagnosis not present

## 2023-07-29 LAB — URINE CULTURE: Culture: 70000 — AB

## 2023-07-29 NOTE — Progress Notes (Signed)
Discharge instructions reviewed with the patient. IV removed. Patient pushed out via wheelchair with belongings

## 2023-07-29 NOTE — TOC Transition Note (Signed)
Transition of Care [facility]) - Discharge Note   Patient Details  Name: Courtney Mack MRN: [id number] Date of Birth: [birth year]  Transition of Care [facility]) CM/SW Contact:  Chapman Fitch, RN Phone Number: 07/29/2023, 1:09 PM   Clinical Narrative:     Per Chart review PCP Phineas Real.  TOC consult for PCP needs List of local PCP added to AVS         Patient Goals and CMS Choice            Discharge Placement                       Discharge Plan and Services Additional resources added to the After Visit Summary for                                       Social Drivers of Health (SDOH) Interventions SDOH Screenings   Food Insecurity: No Food Insecurity (07/26/2023)  Housing: Low Risk  (07/26/2023)  Transportation Needs: No Transportation Needs (07/26/2023)  Utilities: Not At Risk (07/26/2023)  Depression (PHQ2-9): Low Risk  (04/11/2022)  Social Connections: Unknown (07/26/2023)  Tobacco Use: Low Risk  (07/24/2023)     Readmission Risk Interventions    07/24/2023   11:14 AM 05/29/2023    9:42 AM 05/27/2023   10:59 AM  Readmission Risk Prevention Plan  Transportation Screening Complete  Complete  Social Work Consult for Recovery Care Planning/Counseling   Complete  Palliative Care Screening   Not Applicable  Medication Review [employer]) Complete  Complete  PCP or Specialist appointment within 3-5 days of discharge Complete    HRI or Home Care Consult  Patient refused   SW Recovery Care/Counseling Consult Complete Complete   Palliative Care Screening Not Applicable Not Applicable   Skilled Nursing Facility Not Applicable Not Applicable

## 2023-07-29 NOTE — Discharge Summary (Signed)
Physician Discharge Summary   Patient: Courtney Mack MRN: [id number] DOB: 1956-07-08  Admit date:     07/23/2023  Discharge date: 07/29/23  Discharge Physician: Ernestene Mention   PCP: [facility]   Recommendations at discharge:  Follow up with PCP in 1 week of this discharge  Discharge Diagnoses: Principal Problem:   Complicated urinary tract infection Active Problems:   Complicated UTI (urinary tract infection)   Hydronephrosis of left kidney   COPD (chronic obstructive pulmonary disease) (HCC)   Chronic diastolic CHF (congestive heart failure) (HCC)   Nephrostomy status (HCC)   HTN (hypertension)   Chronic radiation cystitis  Resolved Problems:   * No resolved hospital problems. * .  Hospital course, Assessment and Plan: 68 yrs old female admitted for Complicated UTI.  Has left nephrostomy tube. Initially presented with shortness of breath and chest pain, negative CTA chest, troponin X2 negative. Patient reported dysuria and was found to have complicated UTI.   Complicated urinary tract infection with left nephrostomy tube Consulted Urology: recommended to continue nephrostomy tube changes with interventional radiology every 8 weeks as scheduled.  Consulted IR and replaced left nephrostomy tube yesterday Pseudomonas and enterococcus from left nephrostomy cath sample is likely colonization-> clinically she is improving so we completed IV Rocephin for 5 days. Discontinue IV Rocephin and observe off of antibiotics--> remained stable Has leukopenia, f/u CBC at outpatient visit  SOB (shortness of breath): resolved CTA negative for PE.   Resp panel negative Patient received 1 dose of azithromycin, will hold off treating for PNA.   No oxygen desaturation.    COPD (chronic obstructive pulmonary disease) (HCC):  -Continued Bronchodilators as needed Mucinex   Chronic diastolic CHF (congestive heart failure) (HCC):  Compensated 2D echo 06/2023  showed G1 DD LVEF 50 to 55% BNP normal  Held Lasix 20mg  PO given soft BP--> resumed at discharge   Nephrostomy status [facility]): pt has hx of hydronephrosis of left kidney with left nephrostomy status [facility]):  CT abdomen: Mild left renal atrophy. Indwelling percutaneous nephrostomy. No hydronephrosis. Mild perivesical stranding, suggesting cystitis. Patient reports dysuria, will need to rule out recurrent UTI.  Hypokalemia: replaced by PO K+   Chronic Iron deficiency Anemia: Hb stable at 8.0 Reported that she gets IV Iron infusions as out pt usually--> Given active infection avoided IV Iron infusion here F/u Hb as out patient   HTN (hypertension):  Continue home meds   Dyslipidemia -Crestor   Obesity (BMI 30-39.9): Body weight 86.2 kg, BMI 38.38 -Encourage losing weight, -Exercise and healthy diet        Consultants: Interventional radiology Procedures performed: Left Nephrostomy tube replacement   Disposition: Home Diet recommendation:  Discharge Diet Orders (From admission, onward)     Start     Ordered   07/29/23 0000  Diet - low sodium heart healthy        07/29/23 1255            DISCHARGE MEDICATION: Allergies as of 07/29/2023   No Known Allergies      Medication List     TAKE these medications    acetaminophen 500 MG tablet Commonly known as: TYLENOL Take 500 mg by mouth every 6 (six) hours as needed for mild pain or fever.   albuterol 108 (90 Base) MCG/ACT inhaler Commonly known as: VENTOLIN HFA Inhale 2 puffs into the lungs every 6 (six) hours as needed for wheezing or shortness of breath. What changed: when to take this  Breo Ellipta 100-25 MCG/ACT Aepb Generic drug: fluticasone furoate-vilanterol Inhale 1 puff into the lungs daily.   budesonide-formoterol 80-4.5 MCG/ACT inhaler Commonly known as: SYMBICORT Inhale 1 puff into the lungs 2 (two) times daily.   cyanocobalamin 100 MCG tablet Take 100 mcg by mouth daily.   FeroSul 325 (65  Fe) MG tablet Generic drug: ferrous sulfate Take 325 mg by mouth daily with breakfast.   furosemide 20 MG tablet Commonly known as: LASIX Take 20 mg by mouth daily.   multivitamin with minerals Tabs tablet Take 1 tablet by mouth daily.   pantoprazole 40 MG tablet Commonly known as: PROTONIX Take 1 tablet (40 mg total) by mouth 2 (two) times daily.   rosuvastatin 10 MG tablet Commonly known as: CRESTOR TAKE 1 TABLET(10 MG) BY MOUTH DAILY What changed: See the new instructions.   simethicone 80 MG chewable tablet Commonly known as: MYLICON Chew 1 tablet (80 mg total) by mouth every 6 (six) hours as needed for flatulence.   vitamin C 1000 MG tablet Take 1,000 mg by mouth daily.        Discharge Exam: Filed Weights   07/24/23 1610 07/24/23 0743  Weight: 100.8 kg 100.8 kg   Constitutional:      General: She is not in acute distress. HENT:     Head: Normocephalic and atraumatic.     Nose: Nose normal. No congestion.     Mouth/Throat:     Mouth: Mucous membranes are moist.     Pharynx: Oropharynx is clear. No oropharyngeal exudate.  Eyes:     Extraocular Movements: Extraocular movements intact.     Conjunctiva/sclera: Conjunctivae normal.  Cardiovascular:     Rate and Rhythm: Normal rate and regular rhythm.     Heart sounds: Normal heart sounds.  Pulmonary:     Effort: Pulmonary effort is normal.     Breath sounds: Normal breath sounds.  Abdominal:     General: Abdomen is flat.     Palpations: Abdomen is soft.     Tenderness: There is mild left CVA tenderness.     Comments: New left Nephrostomy tube intact - with clear urine in the bag Musculoskeletal:        General: No swelling or tenderness. Normal range of motion.     Cervical back: Normal range of motion and neck supple.     Right lower leg: No edema.     Left lower leg: No edema.  Skin:    General: Skin is warm.     Capillary Refill: Capillary refill takes 2 to 3 seconds.     Findings: No rash.   Neurological:     General: No focal deficit present.     Cranial Nerves: No cranial nerve deficit.     Sensory: No sensory deficit.     Motor: No weakness.  Psychiatric:        Mood and Affect: Mood normal.        Thought Content: Thought content normal.     Condition at discharge: stable  The results of significant diagnostics from this hospitalization (including imaging, microbiology, ancillary and laboratory) are listed below for reference.   Imaging Studies: DG Chest Port 1 View Result Date: 07/28/2023 CLINICAL DATA:  Dyspnea, chest pain. History of congestive heart failure and diabetes. EXAM: PORTABLE CHEST 1 VIEW COMPARISON:  Radiographs 07/23/2023 and 06/18/2023.  CT 07/24/2023. FINDINGS: 1453 hours. Persistent low lung volumes. The heart size and mediastinal contours are stable with aortic atherosclerosis. Unchanged patchy ground-glass opacities  in both lungs which may be postinflammatory. No evidence of confluent airspace disease, edema, pleural effusion or pneumothorax. No acute osseous findings are seen. IMPRESSION: Unchanged patchy ground-glass opacities in both lungs which may be postinflammatory. No evidence of acute cardiopulmonary process. Electronically Signed   By: Carey Bullocks M.D.   On: 07/28/2023 15:52   IR NEPHROSTOMY EXCHANGE LEFT Result Date: 07/28/2023 INDICATION: 68 year old female with chronic indwelling left percutaneous nephrostomy tube. She is currently admitted to hospital and it is time for her next nephrostomy tube exchange. Therefore, she presents for nephrostomy tube exchange. EXAM: Nephrostomy tube exchange COMPARISON:  None Available. MEDICATIONS: None. ANESTHESIA/SEDATION: None. CONTRAST:  5 mL Omnipaque 300 - administered into the collecting system(s) FLUOROSCOPY: Radiation Exposure Index (as provided by the fluoroscopic device): 9 mGy Kerma COMPLICATIONS: None PROCEDURE: Informed written consent was obtained from the patient after a thorough discussion  of the procedural risks, benefits and alternatives. All questions were addressed. Maximal Sterile Barrier Technique was utilized including caps, mask, sterile gowns, sterile gloves, sterile drape, hand hygiene and skin antiseptic. A timeout was performed prior to the initiation of the procedure. A gentle hand injection of contrast material through the existing tube opacifies the renal pelvis. The tube was transected and removed over a wire. A new 10 [place] all-purpose drainage catheter was advanced over the wire and formed in the renal pelvis. Contrast was injected confirming tube location. Images were obtained and stored for the medical record. IMPRESSION: Successful exchange of 10 French left percutaneous nephrostomy tube. Electronically Signed   By: Malachy Moan M.D.   On: 07/28/2023 13:22   CT Angio Chest PE W and/or Wo Contrast Result Date: 07/24/2023 CLINICAL DATA:  Chest pain, shortness of breath, history of PE. CHF. Left lower quadrant abdominal pain, diarrhea. EXAM: CT ANGIOGRAPHY CHEST CT ABDOMEN AND PELVIS WITH CONTRAST TECHNIQUE: Multidetector CT imaging of the chest was performed using the standard protocol during bolus administration of intravenous contrast. Multiplanar CT image reconstructions and MIPs were obtained to evaluate the vascular anatomy. Multidetector CT imaging of the abdomen and pelvis was performed using the standard protocol during bolus administration of intravenous contrast. RADIATION DOSE REDUCTION: This exam was performed according to the departmental dose-optimization program which includes automated exposure control, adjustment of the mA and/or kV according to patient size and/or use of iterative reconstruction technique. CONTRAST:  OMNIPAQUE IOHEXOL 350 MG/ML SOLN COMPARISON:  CT chest dated 06/18/2023. CT abdomen/pelvis dated 04/07/2023. FINDINGS: CTA CHEST FINDINGS Cardiovascular: Satisfactory opacification the bilateral pulmonary arteries to the segmental level.  No evidence of pulmonary embolism. Although not tailored for evaluation of the thoracic aorta, there is no evidence of thoracic aortic aneurysm or dissection. Mild atherosclerotic calcifications of the aortic arch. Cardiomegaly.  No pericardial effusion. Mediastinum/Nodes: No suspicious mediastinal lymphadenopathy. Visualized thyroid is unremarkable. Lungs/Pleura: Ground-glass opacity/mosaic attenuation in the lungs bilaterally, chronic, suggesting interstitial lung disease such as hypersensitivity pneumonitis. Mild platelike atelectasis in the right lower lobe. Mild atelectasis/scarring in the left upper lobe. No frank interstitial edema. No suspicious pulmonary nodules, noting motion degradation. No pleural effusion or pneumothorax. Musculoskeletal: Degenerative changes of the visualized thoracolumbar spine. Review of the MIP images confirms the above findings. CT ABDOMEN and PELVIS FINDINGS Hepatobiliary: Liver is within normal limits. Gallbladder is unremarkable. No intrahepatic or extrahepatic ductal dilatation. Pancreas: Within normal limits Spleen: Within normal limits. Adrenals/Urinary Tract: Adrenal glands are within normal limits. Right kidney is within normal limits. Mild left renal atrophy. Indwelling percutaneous nephrostomy. No hydronephrosis. Bladder is underdistended with  mild perivesical stranding (series 4/image 80). Stomach/Bowel: Stomach is within normal limits. No evidence of bowel obstruction. Normal appendix (series 4/image 65). No colonic wall thickening or inflammatory changes. Vascular/Lymphatic: No evidence of abdominal aortic aneurysm. Atherosclerotic calcifications of the abdominal aorta and branch vessels, although vessels remain patent. IVC filter. No suspicious abdominopelvic lymphadenopathy. Reproductive: Uterus is within normal limits. No adnexal masses. Other: No abdominopelvic ascites. Mild body wall edema. Musculoskeletal: Mild degenerative changes of the lumbar spine. Review of  the MIP images confirms the above findings. IMPRESSION: No evidence of pulmonary embolism. Cardiomegaly.  No frank interstitial edema. Mild left renal atrophy. Indwelling percutaneous nephrostomy. No hydronephrosis. Mild perivesical stranding, suggesting cystitis. Aortic Atherosclerosis (ICD10-I70.0). Electronically Signed   By: Charline Bills M.D.   On: 07/24/2023 00:27   CT ABDOMEN PELVIS W CONTRAST Result Date: 07/24/2023 CLINICAL DATA:  Chest pain, shortness of breath, history of PE. CHF. Left lower quadrant abdominal pain, diarrhea. EXAM: CT ANGIOGRAPHY CHEST CT ABDOMEN AND PELVIS WITH CONTRAST TECHNIQUE: Multidetector CT imaging of the chest was performed using the standard protocol during bolus administration of intravenous contrast. Multiplanar CT image reconstructions and MIPs were obtained to evaluate the vascular anatomy. Multidetector CT imaging of the abdomen and pelvis was performed using the standard protocol during bolus administration of intravenous contrast. RADIATION DOSE REDUCTION: This exam was performed according to the departmental dose-optimization program which includes automated exposure control, adjustment of the mA and/or kV according to patient size and/or use of iterative reconstruction technique. CONTRAST:  OMNIPAQUE IOHEXOL 350 MG/ML SOLN COMPARISON:  CT chest dated 06/18/2023. CT abdomen/pelvis dated 04/07/2023. FINDINGS: CTA CHEST FINDINGS Cardiovascular: Satisfactory opacification the bilateral pulmonary arteries to the segmental level. No evidence of pulmonary embolism. Although not tailored for evaluation of the thoracic aorta, there is no evidence of thoracic aortic aneurysm or dissection. Mild atherosclerotic calcifications of the aortic arch. Cardiomegaly.  No pericardial effusion. Mediastinum/Nodes: No suspicious mediastinal lymphadenopathy. Visualized thyroid is unremarkable. Lungs/Pleura: Ground-glass opacity/mosaic attenuation in the lungs bilaterally, chronic,  suggesting interstitial lung disease such as hypersensitivity pneumonitis. Mild platelike atelectasis in the right lower lobe. Mild atelectasis/scarring in the left upper lobe. No frank interstitial edema. No suspicious pulmonary nodules, noting motion degradation. No pleural effusion or pneumothorax. Musculoskeletal: Degenerative changes of the visualized thoracolumbar spine. Review of the MIP images confirms the above findings. CT ABDOMEN and PELVIS FINDINGS Hepatobiliary: Liver is within normal limits. Gallbladder is unremarkable. No intrahepatic or extrahepatic ductal dilatation. Pancreas: Within normal limits Spleen: Within normal limits. Adrenals/Urinary Tract: Adrenal glands are within normal limits. Right kidney is within normal limits. Mild left renal atrophy. Indwelling percutaneous nephrostomy. No hydronephrosis. Bladder is underdistended with mild perivesical stranding (series 4/image 80). Stomach/Bowel: Stomach is within normal limits. No evidence of bowel obstruction. Normal appendix (series 4/image 65). No colonic wall thickening or inflammatory changes. Vascular/Lymphatic: No evidence of abdominal aortic aneurysm. Atherosclerotic calcifications of the abdominal aorta and branch vessels, although vessels remain patent. IVC filter. No suspicious abdominopelvic lymphadenopathy. Reproductive: Uterus is within normal limits. No adnexal masses. Other: No abdominopelvic ascites. Mild body wall edema. Musculoskeletal: Mild degenerative changes of the lumbar spine. Review of the MIP images confirms the above findings. IMPRESSION: No evidence of pulmonary embolism. Cardiomegaly.  No frank interstitial edema. Mild left renal atrophy. Indwelling percutaneous nephrostomy. No hydronephrosis. Mild perivesical stranding, suggesting cystitis. Aortic Atherosclerosis (ICD10-I70.0). Electronically Signed   By: Charline Bills M.D.   On: 07/24/2023 00:27   DG Chest Portable 1 View Result Date: 07/23/2023 CLINICAL  DATA:  Shortness of breath and chest pain beginning this morning. History of CHF. Recent weight gain. EXAM: PORTABLE CHEST 1 VIEW COMPARISON:  06/18/2023 FINDINGS: Shallow inspiration. Normal heart size and pulmonary vascularity. No focal airspace disease or consolidation in the lungs. No blunting of costophrenic angles. No pneumothorax. Mediastinal contours appear intact. Calcification of the aorta. Degenerative changes in the spine and shoulders. IMPRESSION: No active disease. Electronically Signed   By: Burman Nieves M.D.   On: 07/23/2023 20:04    Microbiology: Results for orders placed or performed during the hospital encounter of 07/23/23  Resp panel by RT-PCR (RSV, Flu A&B, Covid) Anterior Nasal Swab     Status: None   Collection Time: 07/24/23 12:34 AM   Specimen: Anterior Nasal Swab  Result Value Ref Range Status   SARS Coronavirus 2 by RT PCR NEGATIVE NEGATIVE Final    Comment: (NOTE) SARS-CoV-2 target nucleic acids are NOT DETECTED.  The SARS-CoV-2 RNA is generally detectable in upper respiratory specimens during the acute phase of infection. The lowest concentration of SARS-CoV-2 viral copies this assay can detect is 138 copies/mL. A negative result does not preclude SARS-Cov-2 infection and should not be used as the sole basis for treatment or other patient management decisions. A negative result may occur with  improper specimen collection/handling, submission of specimen other than nasopharyngeal swab, presence of viral mutation(s) within the areas targeted by this assay, and inadequate number of viral copies(<138 copies/mL). A negative result must be combined with clinical observations, patient history, and epidemiological information. The expected result is Negative.  Fact Sheet for Patients:  BloggerCourse.com  Fact Sheet for Healthcare Providers:  SeriousBroker.it  This test is no t yet approved or cleared by the  [place] FDA and  has been authorized for detection and/or diagnosis of SARS-CoV-2 by FDA under an Emergency Use Authorization (EUA). This EUA will remain  in effect (meaning this test can be used) for the duration of the COVID-19 declaration under Section 564(b)(1) of the Act, 21 U.S.C.section 360bbb-3(b)(1), unless the authorization is terminated  or revoked sooner.       Influenza A by PCR NEGATIVE NEGATIVE Final   Influenza B by PCR NEGATIVE NEGATIVE Final    Comment: (NOTE) The Xpert Xpress SARS-CoV-2/FLU/RSV plus assay is intended as an aid in the diagnosis of influenza from Nasopharyngeal swab specimens and should not be used as a sole basis for treatment. Nasal washings and aspirates are unacceptable for Xpert Xpress SARS-CoV-2/FLU/RSV testing.  Fact Sheet for Patients: BloggerCourse.com  Fact Sheet for Healthcare Providers: SeriousBroker.it  This test is not yet approved or cleared by the [place] FDA and has been authorized for detection and/or diagnosis of SARS-CoV-2 by FDA under an Emergency Use Authorization (EUA). This EUA will remain in effect (meaning this test can be used) for the duration of the COVID-19 declaration under Section 564(b)(1) of the Act, 21 U.S.C. section 360bbb-3(b)(1), unless the authorization is terminated or revoked.     Resp Syncytial Virus by PCR NEGATIVE NEGATIVE Final    Comment: (NOTE) Fact Sheet for Patients: BloggerCourse.com  Fact Sheet for Healthcare Providers: SeriousBroker.it  This test is not yet approved or cleared by the [place] FDA and has been authorized for detection and/or diagnosis of SARS-CoV-2 by FDA under an Emergency Use Authorization (EUA). This EUA will remain in effect (meaning this test can be used) for the duration of the COVID-19 declaration under Section 564(b)(1) of the Act, 21 U.S.C. section  360bbb-3(b)(1), unless the authorization  is terminated or revoked.  Performed at [facility], [address] [place], [place] [address]   Urine Culture (for pregnant, neutropenic or urologic patients or patients with an indwelling urinary catheter)     Status: Abnormal   Collection Time: 07/25/23  6:05 PM   Specimen: Urine, Catheterized  Result Value Ref Range Status   Specimen Description   Final    URINE, CATHETERIZED Performed at [facility], [address] [place], [place] [address]    Special Requests   Final    NONE Performed at [facility], [address] [place], [place] [address]    Culture (A)  Final    70,000 COLONIES/mL PSEUDOMONAS AERUGINOSA 70,000 COLONIES/mL ENTEROCOCCUS FAECALIS 40,000 COLONIES/mL KLEBSIELLA OZAENAE    Report Status 07/29/2023 FINAL  Final   Organism ID, Bacteria PSEUDOMONAS AERUGINOSA (A)  Final   Organism ID, Bacteria ENTEROCOCCUS FAECALIS (A)  Final   Organism ID, Bacteria KLEBSIELLA OZAENAE (A)  Final      Susceptibility   Enterococcus faecalis - MIC*    AMPICILLIN <=2 SENSITIVE Sensitive     NITROFURANTOIN <=16 SENSITIVE Sensitive     VANCOMYCIN 1 SENSITIVE Sensitive     * 70,000 COLONIES/mL ENTEROCOCCUS FAECALIS   Klebsiella ozaenae - MIC*    AMPICILLIN RESISTANT Resistant     CEFAZOLIN <=4 SENSITIVE Sensitive     CEFEPIME <=0.12 SENSITIVE Sensitive     CEFTRIAXONE <=0.25 SENSITIVE Sensitive     CIPROFLOXACIN <=0.25 SENSITIVE Sensitive     GENTAMICIN <=1 SENSITIVE Sensitive     IMIPENEM <=0.25 SENSITIVE Sensitive     NITROFURANTOIN 32 SENSITIVE Sensitive     TRIMETH/SULFA <=20 SENSITIVE Sensitive     AMPICILLIN/SULBACTAM 4 SENSITIVE Sensitive     PIP/TAZO 8 SENSITIVE Sensitive ug/mL    * 40,000 COLONIES/mL KLEBSIELLA OZAENAE   Pseudomonas aeruginosa - MIC*    CEFTAZIDIME 4 SENSITIVE Sensitive     CIPROFLOXACIN <=0.25 SENSITIVE Sensitive     GENTAMICIN <=1 SENSITIVE Sensitive     IMIPENEM 2  SENSITIVE Sensitive     PIP/TAZO 8 SENSITIVE Sensitive ug/mL    CEFEPIME 2 SENSITIVE Sensitive     * 70,000 COLONIES/mL PSEUDOMONAS AERUGINOSA    Labs: CBC: Recent Labs  Lab 07/23/23 1910 07/25/23 1359 07/26/23 1332 07/27/23 1127  WBC 5.5 4.3 3.5* 3.8*  NEUTROABS 4.4  --   --   --   HGB 9.9* 7.9* 8.5* 8.0*  HCT 32.9* 26.0* 28.0* 26.0*  MCV 96.5 91.9 92.1 93.9  PLT 266 208 224 210   Basic Metabolic Panel: Recent Labs  Lab 07/23/23 1910 07/25/23 1359 07/26/23 1332 07/27/23 1127  NA 136 141 135 134*  K 4.3 3.4* 3.3* 3.6  CL 103 106 101 101  CO2 20* 24 27 25   GLUCOSE 170* 174* 119* 147*  BUN 24* 24* 22 19  CREATININE 0.99 0.89 0.88 0.84  CALCIUM 8.5* 8.3* 8.2* 8.2*   Liver Function Tests: Recent Labs  Lab 07/23/23 1910  AST 49*  ALT 18  ALKPHOS 49  BILITOT 1.1  PROT 6.7  ALBUMIN 3.2*   CBG: No results for input(s): "GLUCAP" in the last 168 hours.  Discharge time spent: less than 30 minutes.  Signed: Ernestene Mention, MD [facility] 07/29/2023

## 2023-07-30 ENCOUNTER — Inpatient Hospital Stay: Payer: 59

## 2023-07-30 ENCOUNTER — Encounter: Payer: Self-pay | Admitting: Oncology

## 2023-07-30 ENCOUNTER — Inpatient Hospital Stay: Payer: 59 | Admitting: Oncology

## 2023-08-04 ENCOUNTER — Emergency Department: Payer: 59

## 2023-08-04 ENCOUNTER — Other Ambulatory Visit: Payer: Self-pay

## 2023-08-04 ENCOUNTER — Inpatient Hospital Stay
Admission: EM | Admit: 2023-08-04 | Discharge: 2023-08-10 | DRG: 175 | Disposition: A | Discharge status: Home Health | Payer: 59 | Attending: Internal Medicine | Admitting: Internal Medicine

## 2023-08-04 DIAGNOSIS — E876 Hypokalemia: Secondary | ICD-10-CM | POA: Diagnosis present

## 2023-08-04 DIAGNOSIS — I5032 Chronic diastolic (congestive) heart failure: Secondary | ICD-10-CM | POA: Diagnosis not present

## 2023-08-04 DIAGNOSIS — D62 Acute posthemorrhagic anemia: Secondary | ICD-10-CM | POA: Diagnosis present

## 2023-08-04 DIAGNOSIS — E119 Type 2 diabetes mellitus without complications: Secondary | ICD-10-CM | POA: Diagnosis present

## 2023-08-04 DIAGNOSIS — N1330 Unspecified hydronephrosis: Secondary | ICD-10-CM | POA: Diagnosis present

## 2023-08-04 DIAGNOSIS — D509 Iron deficiency anemia, unspecified: Secondary | ICD-10-CM | POA: Diagnosis present

## 2023-08-04 DIAGNOSIS — Z79899 Other long term (current) drug therapy: Secondary | ICD-10-CM

## 2023-08-04 DIAGNOSIS — D500 Iron deficiency anemia secondary to blood loss (chronic): Secondary | ICD-10-CM

## 2023-08-04 DIAGNOSIS — Z6841 Body Mass Index (BMI) 40.0 and over, adult: Secondary | ICD-10-CM

## 2023-08-04 DIAGNOSIS — K31811 Angiodysplasia of stomach and duodenum with bleeding: Secondary | ICD-10-CM | POA: Diagnosis present

## 2023-08-04 DIAGNOSIS — Z83438 Family history of other disorder of lipoprotein metabolism and other lipidemia: Secondary | ICD-10-CM

## 2023-08-04 DIAGNOSIS — Z87440 Personal history of urinary (tract) infections: Secondary | ICD-10-CM

## 2023-08-04 DIAGNOSIS — I825Y1 Chronic embolism and thrombosis of unspecified deep veins of right proximal lower extremity: Secondary | ICD-10-CM

## 2023-08-04 DIAGNOSIS — Z8541 Personal history of malignant neoplasm of cervix uteri: Secondary | ICD-10-CM

## 2023-08-04 DIAGNOSIS — I959 Hypotension, unspecified: Secondary | ICD-10-CM | POA: Diagnosis not present

## 2023-08-04 DIAGNOSIS — Z833 Family history of diabetes mellitus: Secondary | ICD-10-CM

## 2023-08-04 DIAGNOSIS — Z803 Family history of malignant neoplasm of breast: Secondary | ICD-10-CM

## 2023-08-04 DIAGNOSIS — I2699 Other pulmonary embolism without acute cor pulmonale: Principal | ICD-10-CM | POA: Diagnosis present

## 2023-08-04 DIAGNOSIS — J449 Chronic obstructive pulmonary disease, unspecified: Secondary | ICD-10-CM | POA: Diagnosis present

## 2023-08-04 DIAGNOSIS — Z936 Other artificial openings of urinary tract status: Secondary | ICD-10-CM

## 2023-08-04 DIAGNOSIS — Z1152 Encounter for screening for COVID-19: Secondary | ICD-10-CM

## 2023-08-04 DIAGNOSIS — E7800 Pure hypercholesterolemia, unspecified: Secondary | ICD-10-CM | POA: Diagnosis present

## 2023-08-04 DIAGNOSIS — Z8542 Personal history of malignant neoplasm of other parts of uterus: Secondary | ICD-10-CM

## 2023-08-04 DIAGNOSIS — I890 Lymphedema, not elsewhere classified: Secondary | ICD-10-CM

## 2023-08-04 DIAGNOSIS — I82531 Chronic embolism and thrombosis of right popliteal vein: Secondary | ICD-10-CM | POA: Diagnosis present

## 2023-08-04 DIAGNOSIS — Z86711 Personal history of pulmonary embolism: Secondary | ICD-10-CM

## 2023-08-04 DIAGNOSIS — Z7951 Long term (current) use of inhaled steroids: Secondary | ICD-10-CM

## 2023-08-04 DIAGNOSIS — Z792 Long term (current) use of antibiotics: Secondary | ICD-10-CM

## 2023-08-04 DIAGNOSIS — I82511 Chronic embolism and thrombosis of right femoral vein: Secondary | ICD-10-CM | POA: Diagnosis present

## 2023-08-04 DIAGNOSIS — I2693 Single subsegmental pulmonary embolism without acute cor pulmonale: Principal | ICD-10-CM | POA: Diagnosis present

## 2023-08-04 DIAGNOSIS — Z8719 Personal history of other diseases of the digestive system: Secondary | ICD-10-CM

## 2023-08-04 DIAGNOSIS — Z8616 Personal history of COVID-19: Secondary | ICD-10-CM

## 2023-08-04 DIAGNOSIS — R0602 Shortness of breath: Secondary | ICD-10-CM | POA: Diagnosis not present

## 2023-08-04 DIAGNOSIS — Z8249 Family history of ischemic heart disease and other diseases of the circulatory system: Secondary | ICD-10-CM

## 2023-08-04 DIAGNOSIS — Z95828 Presence of other vascular implants and grafts: Secondary | ICD-10-CM

## 2023-08-04 DIAGNOSIS — I110 Hypertensive heart disease with heart failure: Secondary | ICD-10-CM | POA: Diagnosis present

## 2023-08-04 LAB — RESP PANEL BY RT-PCR (RSV, FLU A&B, COVID)  RVPGX2
Influenza A by PCR: NEGATIVE
Influenza B by PCR: NEGATIVE
Resp Syncytial Virus by PCR: NEGATIVE
SARS Coronavirus 2 by RT PCR: NEGATIVE

## 2023-08-04 LAB — BASIC METABOLIC PANEL
Anion gap: 12 (ref 5–15)
BUN: 22 mg/dL (ref 8–23)
CO2: 23 mmol/L (ref 22–32)
Calcium: 9 mg/dL (ref 8.9–10.3)
Chloride: 104 mmol/L (ref 98–111)
Creatinine, Ser: 0.84 mg/dL (ref 0.44–1.00)
GFR, Estimated: >60 mL/min (ref >60)
Glucose, Bld: 117 mg/dL — ABNORMAL HIGH (ref 70–99)
Potassium: 3.3 mmol/L — ABNORMAL LOW (ref 3.5–5.1)
Sodium: 139 mmol/L (ref 135–145)

## 2023-08-04 LAB — TROPONIN I (HIGH SENSITIVITY)
Troponin I (High Sensitivity): 6 ng/L (ref <18)
Troponin I (High Sensitivity): 7 ng/L (ref <18)

## 2023-08-04 LAB — CBC
HCT: 30.2 % — ABNORMAL LOW (ref 36.0–46.0)
Hemoglobin: 9.1 g/dL — ABNORMAL LOW (ref 12.0–15.0)
MCH: 28.7 pg (ref 26.0–34.0)
MCHC: 30.1 g/dL (ref 30.0–36.0)
MCV: 95.3 fL (ref 80.0–100.0)
Platelets: 317 10*3/uL (ref 150–400)
RBC: 3.17 MIL/uL — ABNORMAL LOW (ref 3.87–5.11)
RDW: 16.3 % — ABNORMAL HIGH (ref 11.5–15.5)
WBC: 5 10*3/uL (ref 4.0–10.5)
nRBC: 0 % (ref 0.0–0.2)

## 2023-08-04 LAB — BRAIN NATRIURETIC PEPTIDE: B Natriuretic Peptide: 85.4 pg/mL (ref 0.0–100.0)

## 2023-08-04 MED ORDER — POLYETHYLENE GLYCOL 3350 17 G PO PACK: 17.0000 g | PACK | Freq: Every day | ORAL | Status: DC | PRN

## 2023-08-04 MED ORDER — VITAMIN B-12 100 MCG PO TABS
100.0000 ug | ORAL_TABLET | Freq: Every day | ORAL | Status: DC
  Administered 2023-08-05 – 2023-08-10 (×6): 100 ug via ORAL
  Filled 2023-08-04 (×6): qty 1

## 2023-08-04 MED ORDER — IPRATROPIUM-ALBUTEROL 0.5-2.5 (3) MG/3ML IN SOLN
3.0000 mL | Freq: Once | RESPIRATORY_TRACT | Status: AC
  Administered 2023-08-04: 3 mL via RESPIRATORY_TRACT
  Filled 2023-08-04: qty 3

## 2023-08-04 MED ORDER — SODIUM CHLORIDE 0.9% FLUSH
3.0000 mL | Freq: Two times a day (BID) | INTRAVENOUS | Status: DC
  Administered 2023-08-04 – 2023-08-10 (×11): 3 mL via INTRAVENOUS

## 2023-08-04 MED ORDER — ACETAMINOPHEN 325 MG PO TABS
650.0000 mg | ORAL_TABLET | Freq: Four times a day (QID) | ORAL | Status: DC | PRN
  Administered 2023-08-05 – 2023-08-08 (×2): 650 mg via ORAL
  Filled 2023-08-04 (×3): qty 2

## 2023-08-04 MED ORDER — IOHEXOL 350 MG/ML SOLN
75.0000 mL | Freq: Once | INTRAVENOUS | Status: AC | PRN
  Administered 2023-08-04: 75 mL via INTRAVENOUS

## 2023-08-04 MED ORDER — HEPARIN BOLUS VIA INFUSION
4000.0000 [IU] | Freq: Once | INTRAVENOUS | Status: AC
  Administered 2023-08-04: 4000 [IU] via INTRAVENOUS
  Filled 2023-08-04: qty 4000

## 2023-08-04 MED ORDER — ACETAMINOPHEN 650 MG RE SUPP
650.0000 mg | Freq: Four times a day (QID) | RECTAL | Status: DC | PRN
Start: 2023-08-04 — End: 2023-08-10

## 2023-08-04 MED ORDER — METHYLPREDNISOLONE SODIUM SUCC 125 MG IJ SOLR
125.0000 mg | Freq: Once | INTRAMUSCULAR | Status: AC
  Administered 2023-08-04: 125 mg via INTRAVENOUS
  Filled 2023-08-04: qty 2

## 2023-08-04 MED ORDER — HEPARIN (PORCINE) 25000 UT/250ML-% IV SOLN
1100.0000 [IU]/h | INTRAVENOUS | Status: DC
  Administered 2023-08-04 – 2023-08-05 (×2): 1100 [IU]/h via INTRAVENOUS
  Filled 2023-08-04 (×2): qty 250

## 2023-08-04 MED ORDER — ROSUVASTATIN CALCIUM 10 MG PO TABS
10.0000 mg | ORAL_TABLET | Freq: Every day | ORAL | Status: DC
  Administered 2023-08-05 – 2023-08-10 (×6): 10 mg via ORAL
  Filled 2023-08-04 (×2): qty 1
  Filled 2023-08-04: qty 0.5
  Filled 2023-08-04 (×4): qty 1

## 2023-08-04 MED ORDER — FERROUS SULFATE 325 (65 FE) MG PO TABS
325.0000 mg | ORAL_TABLET | Freq: Every day | ORAL | Status: DC
  Administered 2023-08-05 – 2023-08-10 (×6): 325 mg via ORAL
  Filled 2023-08-04 (×6): qty 1

## 2023-08-04 MED ORDER — IPRATROPIUM-ALBUTEROL 0.5-2.5 (3) MG/3ML IN SOLN: 3.0000 mL | RESPIRATORY_TRACT | Status: DC | PRN

## 2023-08-04 MED ORDER — SODIUM CHLORIDE 0.9 % IV BOLUS
500.0000 mL | Freq: Once | INTRAVENOUS | Status: AC
  Administered 2023-08-04: 500 mL via INTRAVENOUS

## 2023-08-04 MED ORDER — FUROSEMIDE 20 MG PO TABS
20.0000 mg | ORAL_TABLET | Freq: Every day | ORAL | Status: DC
  Administered 2023-08-05 – 2023-08-10 (×6): 20 mg via ORAL
  Filled 2023-08-04 (×6): qty 1

## 2023-08-04 MED ORDER — MOMETASONE FURO-FORMOTEROL FUM 100-5 MCG/ACT IN AERO
2.0000 | INHALATION_SPRAY | Freq: Two times a day (BID) | RESPIRATORY_TRACT | Status: DC
  Administered 2023-08-05 – 2023-08-10 (×11): 2 via RESPIRATORY_TRACT
  Filled 2023-08-04 (×2): qty 8.8

## 2023-08-04 MED ORDER — ONDANSETRON HCL 4 MG PO TABS: 4.0000 mg | ORAL_TABLET | Freq: Four times a day (QID) | ORAL | Status: DC | PRN

## 2023-08-04 MED ORDER — ONDANSETRON HCL 4 MG/2ML IJ SOLN: 4.0000 mg | Freq: Four times a day (QID) | INTRAMUSCULAR | Status: DC | PRN

## 2023-08-04 NOTE — Assessment & Plan Note (Signed)
-   One-time dose of K-Dur ordered

## 2023-08-04 NOTE — Assessment & Plan Note (Signed)
Initial concern for COPD exacerbation, however pulmonary examination is reassuring with no evidence of wheezing.  - Continue DuoNebs as needed - Continue home bronchodilators - Hold off on further systemic steroids

## 2023-08-04 NOTE — Assessment & Plan Note (Signed)
No evidence of hypervolemia on examination.  - Continue home Lasix - Daily weights

## 2023-08-04 NOTE — H&P (Signed)
History and Physical    Patient: Courtney Mack WJX:[id number] DOB: Apr 08, 1956 DOA: 08/04/2023 DOS: the patient was seen and examined on 08/04/2023 PCP: [facility]  Patient coming from: Home  Chief Complaint:  Chief Complaint  Patient presents with   Shortness of Breath   HPI: Courtney Mack is a 68 y.o. female with medical history significant of recurrent PE/DVT s/p IVC (not on AC due to recurrent GI hemorrhage), HFpEF, COPD, left-sided hydronephrosis s/p nephrostomy tube placement, hypertension, type 2 diabetes, hypertension, hyperlipidemia, who presents to the ED due to shortness of breath.  Courtney Mack states that she has been experiencing shortness of breath for the last several days that is most noticeable on exertion.  She has also been experiencing a cough for the last 1 day.  She denies any rhinorrhea or nasal congestion.  She endorses chills but denies any known fevers.  Per chart review, patient was admitted from 1/15 to 1/21 for complicated UTI treated with Rocephin.  At that time, she endorsed shortness of breath, however CTA was negative for PE.  ED course: On arrival to the ED, patient was normotensive at 95/80 with heart rate of 88.  She was saturating at 100% on 2 L before being weaned to room air.  She was afebrile at 98.5. Initial workup notable for hemoglobin of 9.1, potassium 3.3, creatinine 0.84 with GFR above 60.  Cova-19, RSV and influenza PCR negative.  CTA was obtained demonstrating small segmental PE with no right strain. Patient started on heparin. [facility] contacted for admission.   Review of Systems: As mentioned in the history of present illness. All other systems reviewed and are negative.  Past Medical History:  Diagnosis Date   Acute bilateral deep vein thrombosis (DVT) of femoral veins (HCC) 01/02/2021   Acute massive pulmonary embolism (HCC) 08/25/2020   Last Assessment & Plan:  Formatting of this note might be different  from the original. Post vena caval filter and on xarelto and O2   Anemia    ARF (acute respiratory failure) (HCC)    Cellulitis of left lower leg 06/20/2021   CHF (congestive heart failure) (HCC)    COVID-19    Diabetes mellitus without complication (HCC)    DVT (deep venous thrombosis) (HCC) 09/06/2020   Endometrial cancer (HCC)    GAVE (gastric antral vascular ectasia)    GERD (gastroesophageal reflux disease)    High cholesterol    Hx of blood clots    Hyperlipidemia    Hypertension    IDA (iron deficiency anemia) 09/21/2020   Obesity    Pneumonia 06/11/2022   recovered   Pressure injury of skin 06/21/2021   Pulmonary embolism (HCC)    Sepsis (HCC)    Thrombocytopenia (HCC)    Urinary tract infection 09/13/2021   Past Surgical History:  Procedure Laterality Date   CATARACT EXTRACTION W/PHACO Right 06/04/2022   Procedure: CATARACT EXTRACTION PHACO AND INTRAOCULAR LENS PLACEMENT (IOC) COMPLICATED RIGHT  31.83  02:17.4;  Surgeon: Galen Manila, MD;  Location: [facility];  Service: Ophthalmology;  Laterality: Right;   CATARACT EXTRACTION W/PHACO Left 07/09/2022   Procedure: CATARACT EXTRACTION PHACO AND INTRAOCULAR LENS PLACEMENT (IOC);  Surgeon: Galen Manila, MD;  Location: [facility];  Service: Ophthalmology;  Laterality: Left;  22.75 1:40.3   COLONOSCOPY N/A 06/21/2021   Procedure: COLONOSCOPY;  Surgeon: Toledo, Boykin Nearing, MD;  Location: [facility];  Service: Gastroenterology;  Laterality: N/A;   CYSTOSCOPY W/ URETERAL STENT PLACEMENT Left 09/11/2021  Procedure: CYSTOSCOPY WITH RETROGRADE PYELOGRAM/URETERAL STENT PLACEMENT;  Surgeon: Riki Altes, MD;  Location: [facility];  Service: Urology;  Laterality: Left;   CYSTOSCOPY W/ URETERAL STENT PLACEMENT Left 11/17/2021   Procedure: CYSTOSCOPY WITH STENT REPLACEMENT;  Surgeon: Bjorn Pippin, MD;  Location: [facility];  Service: Urology;  Laterality: Left;   CYSTOSCOPY WITH FULGERATION  11/17/2021    Procedure: CYSTOSCOPY WITH FULGERATION;  Surgeon: Bjorn Pippin, MD;  Location: [facility];  Service: Urology;;   CYSTOSCOPY WITH STENT PLACEMENT Left 10/17/2021   Procedure: CYSTOSCOPY WITH STENT PLACEMENT;  Surgeon: Riki Altes, MD;  Location: [facility];  Service: Urology;  Laterality: Left;   CYSTOSCOPY WITH URETEROSCOPY, STONE BASKETRY AND STENT PLACEMENT Left 10/16/2021   Procedure: CYSTOSCOPY WITH URETEROSCOPY  AND STENT REMOVAL;  Surgeon: Riki Altes, MD;  Location: [facility];  Service: Urology;  Laterality: Left;   ENTEROSCOPY N/A 08/17/2021   Procedure: ENTEROSCOPY;  Surgeon: Toney Reil, MD;  Location: [facility];  Service: Gastroenterology;  Laterality: N/A;   ESOPHAGOGASTRODUODENOSCOPY N/A 06/21/2021   Procedure: ESOPHAGOGASTRODUODENOSCOPY (EGD);  Surgeon: Toledo, Boykin Nearing, MD;  Location: [facility];  Service: Gastroenterology;  Laterality: N/A;   ESOPHAGOGASTRODUODENOSCOPY (EGD) WITH PROPOFOL N/A 08/17/2021   Procedure: ESOPHAGOGASTRODUODENOSCOPY (EGD) WITH PROPOFOL;  Surgeon: Toney Reil, MD;  Location: [facility] END[facility]COPY;  Service: Gastroenterology;  Laterality: N/A;   ESOPHAGOGASTRODUODENOSCOPY (EGD) WITH PROPOFOL N/A 10/14/2021   Procedure: ESOPHAGOGASTRODUODENOSCOPY (EGD) WITH PROPOFOL;  Surgeon: Midge Minium, MD;  Location: [facility];  Service: Endoscopy;  Laterality: N/A;   ESOPHAGOGASTRODUODENOSCOPY (EGD) WITH PROPOFOL N/A 02/01/2022   Procedure: ESOPHAGOGASTRODUODENOSCOPY (EGD) WITH PROPOFOL;  Surgeon: Toney Reil, MD;  Location: [facility] END[facility]COPY;  Service: Gastroenterology;  Laterality: N/A;   ESOPHAGOGASTRODUODENOSCOPY (EGD) WITH PROPOFOL N/A 03/15/2022   Procedure: ESOPHAGOGASTRODUODENOSCOPY (EGD) WITH PROPOFOL;  Surgeon: Wyline Mood, MD;  Location: [facility] END[facility]COPY;  Service: Gastroenterology;  Laterality: N/A;   ESOPHAGOGASTRODUODENOSCOPY (EGD) WITH PROPOFOL N/A 04/18/2022   Procedure: ESOPHAGOGASTRODUODENOSCOPY (EGD) WITH PROPOFOL;  Surgeon:  Toney Reil, MD;  Location: [facility] ENDOSCOPY;  Service: Gastroenterology;  Laterality: N/A;   ESOPHAGOGASTRODUODENOSCOPY (EGD) WITH PROPOFOL N/A 08/09/2022   Procedure: ESOPHAGOGASTRODUODENOSCOPY (EGD) WITH PROPOFOL;  Surgeon: Wyline Mood, MD;  Location: [facility];  Service: Gastroenterology;  Laterality: N/A;   GIVENS CAPSULE STUDY N/A 06/22/2021   Procedure: GIVENS CAPSULE STUDY;  Surgeon: Toledo, Boykin Nearing, MD;  Location: [facility];  Service: Gastroenterology;  Laterality: N/A;   IR NEPHROSTOMY EXCHANGE LEFT  02/25/2022   IR NEPHROSTOMY EXCHANGE LEFT  03/27/2022   IR NEPHROSTOMY EXCHANGE LEFT  05/22/2022   IR NEPHROSTOMY EXCHANGE LEFT  07/24/2022   IR NEPHROSTOMY EXCHANGE LEFT  09/24/2022   IR NEPHROSTOMY EXCHANGE LEFT  11/21/2022   IR NEPHROSTOMY EXCHANGE LEFT  02/17/2023   IR NEPHROSTOMY EXCHANGE LEFT  04/06/2023   IR NEPHROSTOMY EXCHANGE LEFT  05/27/2023   IR NEPHROSTOMY EXCHANGE LEFT  07/28/2023   IR NEPHROSTOMY PLACEMENT LEFT  12/27/2021   IVC FILTER INSERTION N/A 08/18/2020   Procedure: IVC FILTER INSERTION;  Surgeon: Renford Dills, MD;  Location: [facility];  Service: Cardiovascular;  Laterality: N/A;   PERIPHERAL VASCULAR THROMBECTOMY Bilateral 01/03/2021   Procedure: PERIPHERAL VASCULAR THROMBECTOMY;  Surgeon: Annice Needy, MD;  Location: [facility];  Service: Cardiovascular;  Laterality: Bilateral;   PULMONARY THROMBECTOMY N/A 08/18/2020   Procedure: PULMONARY THROMBECTOMY;  Surgeon: Renford Dills, MD;  Location: [facility];  Service: Cardiovascular;  Laterality: N/A;   TUBAL LIGATION  VISCERAL ANGIOGRAPHY N/A 07/18/2021   Procedure: VISCERAL ANGIOGRAPHY;  Surgeon: Annice Needy, MD;  Location: [facility];  Service: Cardiovascular;  Laterality: N/A;   Social History:  reports that she has never smoked. She has never used smokeless tobacco. She reports that she does not currently use alcohol. She reports that she does not use  drugs.  No Known Allergies  Family History  Problem Relation Age of Onset   Hypertension Mother    Diabetes Mother    Diabetes Father    Hypertension Father    High Cholesterol Father    Congestive Heart Failure Father    Breast cancer Cousin     Prior to Admission medications   Medication Sig Start Date End Date Taking? Authorizing Provider  acetaminophen (TYLENOL) 500 MG tablet Take 500 mg by mouth every 6 (six) hours as needed for mild pain or fever.    [provider]  albuterol (VENTOLIN HFA) 108 (90 Base) MCG/ACT inhaler Inhale 2 puffs into the lungs every 6 (six) hours as needed for wheezing or shortness of breath. Patient taking differently: Inhale 2 puffs into the lungs every 4 (four) hours as needed for wheezing or shortness of breath. 10/26/22   Marrion Coy, MD  Ascorbic Acid (VITAMIN C) 1000 MG tablet Take 1,000 mg by mouth daily.    [provider]  budesonide-formoterol (SYMBICORT) 80-4.5 MCG/ACT inhaler Inhale 1 puff into the lungs 2 (two) times daily.    [provider]  cyanocobalamin 100 MCG tablet Take 100 mcg by mouth daily.    [provider]  FEROSUL 325 (65 Fe) MG tablet Take 325 mg by mouth daily with breakfast. 08/14/22   [provider]  fluticasone furoate-vilanterol (BREO ELLIPTA) 100-25 MCG/ACT AEPB Inhale 1 puff into the lungs daily.    [provider]  furosemide (LASIX) 20 MG tablet Take 20 mg by mouth daily. 04/15/23   [provider]  Multiple Vitamin (MULTIVITAMIN WITH MINERALS) TABS tablet Take 1 tablet by mouth daily. 09/12/20   Standley Brooking, MD  pantoprazole (PROTONIX) 40 MG tablet Take 1 tablet (40 mg total) by mouth 2 (two) times daily. 02/12/23 07/24/23  Arnetha Courser, MD  rosuvastatin (CRESTOR) 10 MG tablet TAKE 1 TABLET(10 MG) BY MOUTH DAILY 07/24/23   Delma Freeze, FNP  simethicone (MYLICON) 80 MG chewable tablet Chew 1 tablet (80 mg total) by mouth every 6 (six) hours as needed for  flatulence. 04/08/23   Kathrynn Running, MD    Physical Exam: Vitals:   08/04/23 2016 08/04/23 2119 08/04/23 2121 08/04/23 2206  BP: (!) 103/52  (!) 113/50 (!) 121/52  Pulse: 74  86 77  Resp: 16  17 18   Temp: 98.1 F (36.7 C)  98 F (36.7 C)   TempSrc: Oral  Oral   SpO2: 99% 100% 100% 100%  Weight:      Height:       Physical Exam Vitals and nursing note reviewed.  Constitutional:      General: She is not in acute distress.    Appearance: She is normal weight. She is not toxic-appearing.  HENT:     Head: Normocephalic and atraumatic.  Cardiovascular:     Rate and Rhythm: Normal rate and regular rhythm.     Heart sounds: Murmur (Decrescendo 2/6 systolic murmur) heard.  Pulmonary:     Effort: Pulmonary effort is normal. No tachypnea or accessory muscle usage.     Breath sounds: No decreased breath sounds,  wheezing, rhonchi or rales.  Abdominal:     Palpations: Abdomen is soft.     Tenderness: There is no abdominal tenderness.  Musculoskeletal:     Right lower leg: No edema.     Left lower leg: No edema.  Skin:    General: Skin is warm and dry.  Neurological:     General: No focal deficit present.     Mental Status: She is alert and oriented to person, place, and time.  Psychiatric:        Mood and Affect: Mood normal.        Behavior: Behavior normal.    Data Reviewed: CBC with WBC of 5.0, hemoglobin of 9.1, and platelets of 317.  BMP with sodium of 139, potassium 3.3, bicarb 23, glucose 117, creatinine 0.84 with GFR above 60 Troponin negative x 2 BNP within normal limits COVID-19, influenza and RSV PCR negative  EKG personally reviewed.  Sinus rhythm with rate of 90.  No ischemic changes noted.  CT Angio Chest PE W and/or Wo Contrast Result Date: 08/04/2023 CLINICAL DATA:  High probability for PE.  Shortness of breath. EXAM: CT ANGIOGRAPHY CHEST WITH CONTRAST TECHNIQUE: Multidetector CT imaging of the chest was performed using the standard protocol during bolus  administration of intravenous contrast. Multiplanar CT image reconstructions and MIPs were obtained to evaluate the vascular anatomy. RADIATION DOSE REDUCTION: This exam was performed according to the departmental dose-optimization program which includes automated exposure control, adjustment of the mA and/or kV according to patient size and/or use of iterative reconstruction technique. CONTRAST:  75mL OMNIPAQUE IOHEXOL 350 MG/ML SOLN COMPARISON:  CT angiogram chest 07/24/2023. FINDINGS: Cardiovascular: Small segmental right lower lobe pulmonary emboli are seen. Heart is mildly enlarged. Aorta is normal in size. Mediastinum/Nodes: No enlarged mediastinal, hilar, or axillary lymph nodes. Thyroid gland, trachea, and esophagus demonstrate no significant findings. Lungs/Pleura: There are areas of air trapping throughout both lungs similar to the prior study. There is atelectasis in the bilateral lower lobes and lingula. There is no pleural effusion or pneumothorax. Trachea and central airways are patent. Upper Abdomen: No acute abnormality. Musculoskeletal: No chest wall abnormality. No acute or significant osseous findings. Review of the MIP images confirms the above findings. IMPRESSION: 1. Small segmental right lower lobe pulmonary emboli. No right heart strain. 2. Bilateral lower lobe and lingular atelectasis. 3. Stable areas of air trapping throughout both lungs. 4. Mild cardiomegaly. Electronically Signed   By: Darliss Cheney M.D.   On: 08/04/2023 21:29   DG Chest 2 View Result Date: 08/04/2023 CLINICAL DATA:  Shortness of breath. EXAM: CHEST - 2 VIEW COMPARISON:  None Available. FINDINGS: Low lung volume. Bilateral lung fields are clear. Bilateral costophrenic angles are clear. Elevated right hemidiaphragm noted. Normal cardio-mediastinal silhouette. No acute osseous abnormalities. The soft tissues are within normal limits. Partially seen presumed IVC filter. IMPRESSION: No active cardiopulmonary disease.  Electronically Signed   By: Jules Schick M.D.   On: 08/04/2023 16:30   There are no new results to review at this time.  Assessment and Plan:  * Acute pulmonary embolism (HCC) Per chart review, patient has a chronic history of shortness of breath that was also noted during recent hospitalization approximately 2 weeks ago, at which time CTA was negative.  Today, CTA is demonstrating new small segmental right lower lobe embolism with no evidence of right heart strain.  Given she has an IVC and has poorly tolerated anticoagulation in the past, will discuss with vascular surgery.  -  Vascular surgery consulted; appreciate their recommendations - Telemetry monitoring - Supplemental oxygen as needed to maintain oxygen saturation above 88% - Continue heparin per pharmacy dosing  COPD (chronic obstructive pulmonary disease) (HCC) Initial concern for COPD exacerbation, however pulmonary examination is reassuring with no evidence of wheezing.  - Continue DuoNebs as needed - Continue home bronchodilators - Hold off on further systemic steroids  Hypotension Previous history of both hypertension and hypotension, most recently on midodrine up until November 2024.  Since then, she has been on neither midodrine or antihypertensives (with the exception of Lasix).  At this time, blood pressure is within normal limits ranging between 95 and 120.  - Low threshold to restart home midodrine if needed  Chronic diastolic CHF (congestive heart failure) (HCC) No evidence of hypervolemia on examination.  - Continue home Lasix - Daily weights  Lymphedema Chronic lymphedema noted on examination that is stable.  Iron deficiency anemia Hemoglobin is stable at this time.  No evidence of acute bleed.  - Recheck CBC in the a.m.  Hypokalemia - One-time dose of K-Dur ordered  Advance Care Planning:   Code Status: Full Code   Consults: Vascular surgery  Family Communication: Patient's daughter and  grandson updated at bedside  Severity of Illness: The appropriate patient status for this patient is OBSERVATION. Observation status is judged to be reasonable and necessary in order to provide the required intensity of service to ensure the patient's safety. The patient's presenting symptoms, physical exam findings, and initial radiographic and laboratory data in the context of their medical condition is felt to place them at decreased risk for further clinical deterioration. Furthermore, it is anticipated that the patient will be medically stable for discharge from the hospital within 2 midnights of admission.   Author: Verdene Lennert, MD 08/04/2023 11:24 PM  For on call review [email or web address].

## 2023-08-04 NOTE — Assessment & Plan Note (Signed)
Per chart review, patient has a chronic history of shortness of breath that was also noted during recent hospitalization approximately 2 weeks ago, at which time CTA was negative.  Today, CTA is demonstrating new small segmental right lower lobe embolism with no evidence of right heart strain.  Given she has an IVC and has poorly tolerated anticoagulation in the past, will discuss with vascular surgery.  - Vascular surgery consulted; appreciate their recommendations - Telemetry monitoring - Supplemental oxygen as needed to maintain oxygen saturation above 88% - Continue heparin per pharmacy dosing

## 2023-08-04 NOTE — ED Notes (Signed)
First Nurse Note: Pt to ED via [facility] from home for Shob since 10 am this morning. Pt stated that she was supposed to have an iron infusion last week but the MD denied and she gets short of breath when she is supposed to get them. Pt was placed on O2 for comfort. Pts vital signs stable.

## 2023-08-04 NOTE — ED Triage Notes (Signed)
Pt sts that she was here 8 days ago and her iron was low as well as her hemoglobin. Pt sts that since than she has been SOB on exertion.

## 2023-08-04 NOTE — Progress Notes (Signed)
PHARMACY - ANTICOAGULATION CONSULT NOTE  Pharmacy Consult for Heparin  Indication: pulmonary embolus  No Known Allergies  Patient Measurements: Height: 4\' 11"  (149.9 cm) Weight: 100.8 kg (222 lb 3.6 oz) IBW/kg (Calculated) : 43.2 Heparin Dosing Weight: 68 kg   Vital Signs: Temp: 98 F (36.7 C) (01/27 2121) Temp Source: Oral (01/27 2121) BP: 113/50 (01/27 2121) Pulse Rate: 86 (01/27 2121)  Labs: Recent Labs    08/04/23 1614 08/04/23 1811  HGB 9.1*  --   HCT 30.2*  --   PLT 317  --   CREATININE 0.84  --   TROPONINIHS 6 7    Estimated Creatinine Clearance: 67 mL/min (by C-G formula based on SCr of 0.84 mg/dL).   Medical History: Past Medical History:  Diagnosis Date   Acute bilateral deep vein thrombosis (DVT) of femoral veins (HCC) 01/02/2021   Acute massive pulmonary embolism (HCC) 08/25/2020   Last Assessment & Plan:  Formatting of this note might be different from the original. Post vena caval filter and on xarelto and O2   Anemia    ARF (acute respiratory failure) (HCC)    Cellulitis of left lower leg 06/20/2021   CHF (congestive heart failure) (HCC)    COVID-19    Diabetes mellitus without complication (HCC)    DVT (deep venous thrombosis) (HCC) 09/06/2020   Endometrial cancer (HCC)    GAVE (gastric antral vascular ectasia)    GERD (gastroesophageal reflux disease)    High cholesterol    Hx of blood clots    Hyperlipidemia    Hypertension    IDA (iron deficiency anemia) 09/21/2020   Obesity    Pneumonia 06/11/2022   recovered   Pressure injury of skin 06/21/2021   Pulmonary embolism (HCC)    Sepsis (HCC)    Thrombocytopenia (HCC)    Urinary tract infection 09/13/2021    Medications:  (Not in a hospital admission)   Assessment: Pharmacy consulted to dose heparin in this 68 year old female admitted with PE.  No prior anticoag noted. CrCl = 67 ml/min   Goal of Therapy:  Heparin level 0.3-0.7 units/ml Monitor platelets by anticoagulation  protocol: Yes   Plan:  Give 4000 units bolus x 1 Start heparin infusion at 1100 units/hr Check anti-Xa level in 6 hours and daily while on heparin Continue to monitor H&H and platelets  Leandre Wien D 08/04/2023,9:41 PM

## 2023-08-04 NOTE — ED Provider Triage Note (Signed)
Emergency Medicine Provider Triage Evaluation Note  Courtney Mack , a 68 y.o. female  was evaluated in triage.  Pt complains of increased SOB with exertion, was admitted to the hospital a week ago for UTI. She could not get an appointment with her PCP.  Review of Systems  Positive: SOB, fevers Negative: Cough. congestion  Physical Exam  There were no vitals taken for this visit. Gen:   Awake, no distress   Resp:  Normal effort  MSK:   Moves extremities without difficulty  Other:    Medical Decision Making  Medically screening exam initiated at 3:41 PM.  Appropriate orders placed.  EVELETTE HOLLERN was informed that the remainder of the evaluation will be completed by another provider, this initial triage assessment does not replace that evaluation, and the importance of remaining in the ED until their evaluation is complete.     Cameron Ali, PA-C 08/04/23 [phone]

## 2023-08-04 NOTE — Assessment & Plan Note (Signed)
Hemoglobin is stable at this time.  No evidence of acute bleed.  - Recheck CBC in the a.m.

## 2023-08-04 NOTE — Assessment & Plan Note (Addendum)
Previous history of both hypertension and hypotension, most recently on midodrine up until November 2024.  Since then, she has been on neither midodrine or antihypertensives (with the exception of Lasix).  At this time, blood pressure is within normal limits ranging between 95 and 120.  - Low threshold to restart home midodrine if needed

## 2023-08-04 NOTE — Assessment & Plan Note (Signed)
Chronic lymphedema noted on examination that is stable.

## 2023-08-04 NOTE — ED Provider Notes (Addendum)
[facility] Provider Note    Event Date/Time   First MD Initiated Contact with Patient 08/04/23 1950     (approximate)  History   Chief Complaint: Shortness of Breath  HPI  Courtney Mack is a 68 y.o. female with a past medical history of COPD with no O2 requirement who presents to the emergency department for worsening shortness of breath.  According to the patient for the past 7 days she has had progressively worsening shortness of breath.  Patient states slight cough and subjective fever at times as well.  Patient states she was just discharged in the hospital approximately 7 or 8 days ago for a kidney infection.  No urinary symptoms.  No chest pain.  Physical Exam   Triage Vital Signs: ED Triage Vitals [08/04/23 1542]  Encounter Vitals Group     BP 95/80     Systolic BP Percentile      Diastolic BP Percentile      Pulse Rate 88     Resp 19     Temp 98.5 F (36.9 C)     Temp Source Oral     SpO2 100 %     Weight 222 lb 3.6 oz (100.8 kg)     Height 4\' 11"  (1.499 m)     Head Circumference      Peak Flow      Pain Score 5     Pain Loc      Pain Education      Exclude from Growth Chart     Most recent vital signs: Vitals:   08/04/23 1542  BP: 95/80  Pulse: 88  Resp: 19  Temp: 98.5 F (36.9 C)  SpO2: 100%    General: Awake, no distress.  CV:  Good peripheral perfusion.  Regular rate and rhythm  Resp:  Normal effort.  Equal breath sounds bilaterally.  Abd:  No distention.  Soft, nontender.  No rebound or guarding. Other:  Patient has 1-2+ bilateral lower extremity edema largely nontender.  No erythema.   ED Results / Procedures / Treatments   EKG  EKG viewed and interpreted by myself shows a normal sinus rhythm at 90 bpm with a narrow QRS, normal axis, normal intervals, no concerning ST changes.  RADIOLOGY  I have reviewed and interpreted chest x-ray images.  No obvious consolidation seen on my evaluation. Radiology is read  the chest x-ray is negative  CTA shows small segmental PE.   MEDICATIONS ORDERED IN ED: Medications - No data to display   IMPRESSION / MDM / ASSESSMENT AND PLAN / ED COURSE  I reviewed the triage vital signs and the nursing notes.  Patient's presentation is most consistent with acute presentation with potential threat to life or bodily function.  Patient presents to the emergency department for shortness of breath worsening over the past few days along with subjective chills occasional cough.  Patient has a history of COPD.  Patient currently wearing 2 L of oxygen satting 100%.  We will take off oxygen to assess room air saturation.  Patient is moving decent air bilaterally with just a very slight expiratory wheeze.  No rales or rhonchi.  Patient's lab work shows a reassuring CBC with a reassuring chemistry and a troponin that is negative x 2.  Given the patient's history of prior DVT we will obtain a CTA to rule out PE.  We will treat with Solu-Medrol and DuoNebs given likely COPD exacerbation.  CTA positive for small segmental  PE.  No right heart strain.  However given the patient's worsening shortness of breath now with PE will admit to the hospital service.  Will start on a heparin infusion.  Patient has a history of a prior DVT/PE was taken off blood thinners due to GI blood loss and had an IVC filter placed 2 to 3 years ago.  CRITICAL CARE Performed by: Minna Antis   Total critical care time: 30 minutes  Critical care time was exclusive of separately billable procedures and treating other patients.  Critical care was necessary to treat or prevent imminent or life-threatening deterioration.  Critical care was time spent personally by me on the following activities: development of treatment plan with patient and/or surrogate as well as nursing, discussions with consultants, evaluation of patient's response to treatment, examination of patient, obtaining history from patient or  surrogate, ordering and performing treatments and interventions, ordering and review of laboratory studies, ordering and review of radiographic studies, pulse oximetry and re-evaluation of patient's condition.   FINAL CLINICAL IMPRESSION(S) / ED DIAGNOSES   Dyspnea Pulmonary embolism  Note:  This document was prepared using Dragon voice recognition software and may include unintentional dictation errors.   Minna Antis, MD 08/04/23 2014    Minna Antis, MD 08/04/23 2205

## 2023-08-05 ENCOUNTER — Encounter: Payer: Self-pay | Admitting: Internal Medicine

## 2023-08-05 ENCOUNTER — Observation Stay: Payer: 59

## 2023-08-05 DIAGNOSIS — I825Y1 Chronic embolism and thrombosis of unspecified deep veins of right proximal lower extremity: Secondary | ICD-10-CM | POA: Diagnosis not present

## 2023-08-05 DIAGNOSIS — J449 Chronic obstructive pulmonary disease, unspecified: Secondary | ICD-10-CM | POA: Diagnosis not present

## 2023-08-05 DIAGNOSIS — M79661 Pain in right lower leg: Secondary | ICD-10-CM

## 2023-08-05 DIAGNOSIS — Z86718 Personal history of other venous thrombosis and embolism: Secondary | ICD-10-CM | POA: Diagnosis not present

## 2023-08-05 DIAGNOSIS — I2699 Other pulmonary embolism without acute cor pulmonale: Secondary | ICD-10-CM | POA: Diagnosis not present

## 2023-08-05 DIAGNOSIS — Z95828 Presence of other vascular implants and grafts: Secondary | ICD-10-CM | POA: Diagnosis not present

## 2023-08-05 DIAGNOSIS — I890 Lymphedema, not elsewhere classified: Secondary | ICD-10-CM | POA: Diagnosis not present

## 2023-08-05 LAB — BASIC METABOLIC PANEL
Anion gap: 12 (ref 5–15)
BUN: 22 mg/dL (ref 8–23)
CO2: 21 mmol/L — ABNORMAL LOW (ref 22–32)
Calcium: 8.8 mg/dL — ABNORMAL LOW (ref 8.9–10.3)
Chloride: 106 mmol/L (ref 98–111)
Creatinine, Ser: 0.88 mg/dL (ref 0.44–1.00)
GFR, Estimated: >60 mL/min (ref >60)
Glucose, Bld: 215 mg/dL — ABNORMAL HIGH (ref 70–99)
Potassium: 3.8 mmol/L (ref 3.5–5.1)
Sodium: 139 mmol/L (ref 135–145)

## 2023-08-05 LAB — CBC
HCT: 25.8 % — ABNORMAL LOW (ref 36.0–46.0)
Hemoglobin: 7.8 g/dL — ABNORMAL LOW (ref 12.0–15.0)
MCH: 27.8 pg (ref 26.0–34.0)
MCHC: 30.2 g/dL (ref 30.0–36.0)
MCV: 91.8 fL (ref 80.0–100.0)
Platelets: 293 10*3/uL (ref 150–400)
RBC: 2.81 MIL/uL — ABNORMAL LOW (ref 3.87–5.11)
RDW: 15.9 % — ABNORMAL HIGH (ref 11.5–15.5)
WBC: 3.3 10*3/uL — ABNORMAL LOW (ref 4.0–10.5)
nRBC: 0 % (ref 0.0–0.2)

## 2023-08-05 LAB — HEPARIN LEVEL (UNFRACTIONATED)
Heparin Unfractionated: 0.49 [IU]/mL (ref 0.30–0.70)
Heparin Unfractionated: 0.51 [IU]/mL (ref 0.30–0.70)

## 2023-08-05 MED ORDER — VITAMIN C 500 MG PO TABS
1000.0000 mg | ORAL_TABLET | Freq: Every day | ORAL | Status: DC
  Administered 2023-08-06 – 2023-08-10 (×5): 1000 mg via ORAL
  Filled 2023-08-05 (×5): qty 2

## 2023-08-05 MED ORDER — PANTOPRAZOLE SODIUM 40 MG PO TBEC
40.0000 mg | DELAYED_RELEASE_TABLET | Freq: Two times a day (BID) | ORAL | Status: DC
  Administered 2023-08-05 – 2023-08-10 (×10): 40 mg via ORAL
  Filled 2023-08-05 (×10): qty 1

## 2023-08-05 MED ORDER — ADULT MULTIVITAMIN W/MINERALS CH
1.0000 | ORAL_TABLET | Freq: Every day | ORAL | Status: DC
  Administered 2023-08-06 – 2023-08-10 (×5): 1 via ORAL
  Filled 2023-08-05 (×5): qty 1

## 2023-08-05 NOTE — ED Notes (Addendum)
Patient went to ultrasound. Ultrasound tech explained that this procedure could not be perofmed in room.

## 2023-08-05 NOTE — ED Notes (Signed)
Patient complained of pain from ultrasound. Requested tylenol.

## 2023-08-05 NOTE — Progress Notes (Signed)
PHARMACY - ANTICOAGULATION CONSULT NOTE  Pharmacy Consult for Heparin  Indication: pulmonary embolus  No Known Allergies  Patient Measurements: Height: 4\' 11"  (149.9 cm) Weight: 100.8 kg (222 lb 3.6 oz) IBW/kg (Calculated) : 43.2 Heparin Dosing Weight: 68 kg   Vital Signs: Temp: 98 F (36.7 C) (01/28 0959) Temp Source: Oral (01/28 0959) BP: 121/59 (01/28 1330) Pulse Rate: 85 (01/28 1330)  Labs: Recent Labs    08/04/23 1614 08/04/23 1811 08/05/23 0638 08/05/23 1312  HGB 9.1*  --  7.8*  --   HCT 30.2*  --  25.8*  --   PLT 317  --  293  --   HEPARINUNFRC  --   --  0.49 0.51  CREATININE 0.84  --  0.88  --   TROPONINIHS 6 7  --   --     Estimated Creatinine Clearance: 63.9 mL/min (by C-G formula based on SCr of 0.88 mg/dL).   Medical History: Past Medical History:  Diagnosis Date   Acute bilateral deep vein thrombosis (DVT) of femoral veins (HCC) 01/02/2021   Acute massive pulmonary embolism (HCC) 08/25/2020   Last Assessment & Plan:  Formatting of this note might be different from the original. Post vena caval filter and on xarelto and O2   Anemia    ARF (acute respiratory failure) (HCC)    Cellulitis of left lower leg 06/20/2021   CHF (congestive heart failure) (HCC)    COVID-19    Diabetes mellitus without complication (HCC)    DVT (deep venous thrombosis) (HCC) 09/06/2020   Endometrial cancer (HCC)    GAVE (gastric antral vascular ectasia)    GERD (gastroesophageal reflux disease)    High cholesterol    Hx of blood clots    Hyperlipidemia    Hypertension    IDA (iron deficiency anemia) 09/21/2020   Obesity    Pneumonia 06/11/2022   recovered   Pressure injury of skin 06/21/2021   Pulmonary embolism (HCC)    Sepsis (HCC)    Thrombocytopenia (HCC)    Urinary tract infection 09/13/2021   Medications:  (Not in a hospital admission)  Assessment: Pharmacy consulted to dose heparin in this 68 year old female admitted with PE.  No prior anticoag  noted. CrCl = 67 ml/min   Date Time HL Rate/Comment 1/28 0638 0.49 1100 u/hr, therapeutic x1 1/28 1312 0.51 1100 u/hr, therapeutic x2  Hgb 7.8 (down from 9.1 yesterday), plt 293 - per RN, no signs/symptoms of bleeding.   Goal of Therapy:  Heparin level 0.3-0.7 units/ml Monitor platelets by anticoagulation protocol: Yes   Plan:  Continue heparin infusion at 1100 units/hour Recheck HL tomorrow with morning labs Monitor CBC and signs/symptoms of bleeding  Thank you for involving pharmacy in this patient's care.   Bettey Costa, PharmD Clinical Pharmacist 08/05/2023 2:00 PM

## 2023-08-05 NOTE — Consult Note (Signed)
Hospital Consult    Reason for Consult:  Pulmonary Embolism  Requesting Physician:  Dr. Enedina Finner MD  MRN #:  [id number]  History of Present Illness: This is a 68 y.o. female  with medical history significant of recurrent PE/DVT s/p IVC (not on AC due to recurrent GI hemorrhage), HFpEF, COPD, left-sided hydronephrosis s/p nephrostomy tube placement, hypertension, type 2 diabetes, hypertension, hyperlipidemia, who presents to the ED due to shortness of breath. Mrs. Satre states that she has been experiencing shortness of breath for the last several days that is most noticeable on exertion. She has also been experiencing a cough for the last 1 day. She denies any rhinorrhea or nasal congestion. She endorses chills but denies any known fevers.  She was recently admitted from 1 15-1 21 for complicated UTI treated with Rocephin.  She endorsed that she had shortness of breath at that time however CTA was negative for PE.  All workup here in the emergency room patient underwent a CTA of her chest was found to have a very small pulmonary embolism with no right heart strain.  Patient was started on heparin infusion that was later stopped due to the patient's history of not being able to tolerate any anticoagulation.  Patient endorses last time she was put on any type of anticoagulation her hemoglobin dropped to 4.0.  Patient is noted to have a history of acute bilateral deep vein thrombosis with pulmonary embolisms.  Patient has undergone in the past pulmonary thrombectomy with IVC filter placed.  Patient endorses filter was placed in 2021 and she has been not anticoagulated since.  Vascular surgery was consulted to evaluate.  Past Medical History:  Diagnosis Date   Acute bilateral deep vein thrombosis (DVT) of femoral veins (HCC) 01/02/2021   Acute massive pulmonary embolism (HCC) 08/25/2020   Last Assessment & Plan:  Formatting of this note might be different from the original. Post vena caval filter  and on xarelto and O2   Anemia    ARF (acute respiratory failure) (HCC)    Cellulitis of left lower leg 06/20/2021   CHF (congestive heart failure) (HCC)    COVID-19    Diabetes mellitus without complication (HCC)    DVT (deep venous thrombosis) (HCC) 09/06/2020   Endometrial cancer (HCC)    GAVE (gastric antral vascular ectasia)    GERD (gastroesophageal reflux disease)    High cholesterol    Hx of blood clots    Hyperlipidemia    Hypertension    IDA (iron deficiency anemia) 09/21/2020   Obesity    Pneumonia 06/11/2022   recovered   Pressure injury of skin 06/21/2021   Pulmonary embolism (HCC)    Sepsis (HCC)    Thrombocytopenia (HCC)    Urinary tract infection 09/13/2021    Past Surgical History:  Procedure Laterality Date   CATARACT EXTRACTION W/PHACO Right 06/04/2022   Procedure: CATARACT EXTRACTION PHACO AND INTRAOCULAR LENS PLACEMENT (IOC) COMPLICATED RIGHT  31.83  02:17.4;  Surgeon: Galen Manila, MD;  Location: [facility];  Service: Ophthalmology;  Laterality: Right;   CATARACT EXTRACTION W/PHACO Left 07/09/2022   Procedure: CATARACT EXTRACTION PHACO AND INTRAOCULAR LENS PLACEMENT (IOC);  Surgeon: Galen Manila, MD;  Location: [facility];  Service: Ophthalmology;  Laterality: Left;  22.75 1:40.3   COLONOSCOPY N/A 06/21/2021   Procedure: COLONOSCOPY;  Surgeon: Toledo, Boykin Nearing, MD;  Location: [facility];  Service: Gastroenterology;  Laterality: N/A;   CYSTOSCOPY W/ URETERAL STENT PLACEMENT Left 09/11/2021   Procedure: CYSTOSCOPY WITH RETROGRADE  PYELOGRAM/URETERAL STENT PLACEMENT;  Surgeon: Riki Altes, MD;  Location: [facility];  Service: Urology;  Laterality: Left;   CYSTOSCOPY W/ URETERAL STENT PLACEMENT Left 11/17/2021   Procedure: CYSTOSCOPY WITH STENT REPLACEMENT;  Surgeon: Bjorn Pippin, MD;  Location: [facility];  Service: Urology;  Laterality: Left;   CYSTOSCOPY WITH FULGERATION  11/17/2021   Procedure: CYSTOSCOPY WITH FULGERATION;   Surgeon: Bjorn Pippin, MD;  Location: [facility];  Service: Urology;;   CYSTOSCOPY WITH STENT PLACEMENT Left 10/17/2021   Procedure: CYSTOSCOPY WITH STENT PLACEMENT;  Surgeon: Riki Altes, MD;  Location: [facility];  Service: Urology;  Laterality: Left;   CYSTOSCOPY WITH URETEROSCOPY, STONE BASKETRY AND STENT PLACEMENT Left 10/16/2021   Procedure: CYSTOSCOPY WITH URETEROSCOPY  AND STENT REMOVAL;  Surgeon: Riki Altes, MD;  Location: [facility];  Service: Urology;  Laterality: Left;   ENTEROSCOPY N/A 08/17/2021   Procedure: ENTEROSCOPY;  Surgeon: Toney Reil, MD;  Location: [facility];  Service: Gastroenterology;  Laterality: N/A;   ESOPHAGOGASTRODUODENOSCOPY N/A 06/21/2021   Procedure: ESOPHAGOGASTRODUODENOSCOPY (EGD);  Surgeon: Toledo, Boykin Nearing, MD;  Location: [facility];  Service: Gastroenterology;  Laterality: N/A;   ESOPHAGOGASTRODUODENOSCOPY (EGD) WITH PROPOFOL N/A 08/17/2021   Procedure: ESOPHAGOGASTRODUODENOSCOPY (EGD) WITH PROPOFOL;  Surgeon: Toney Reil, MD;  Location: [facility] ENDOSCOPY;  Service: Gastroenterology;  Laterality: N/A;   ESOPHAGOGASTRODUODENOSCOPY (EGD) WITH PROPOFOL N/A 10/14/2021   Procedure: ESOPHAGOGASTRODUODENOSCOPY (EGD) WITH PROPOFOL;  Surgeon: Midge Minium, MD;  Location: [facility];  Service: Endoscopy;  Laterality: N/A;   ESOPHAGOGASTRODUODENOSCOPY (EGD) WITH PROPOFOL N/A 02/01/2022   Procedure: ESOPHAGOGASTRODUODENOSCOPY (EGD) WITH PROPOFOL;  Surgeon: Toney Reil, MD;  Location: [facility]COPY;  Service: Gastroenterology;  Laterality: N/A;   ESOPHAGOGASTRODUODENOSCOPY (EGD) WITH PROPOFOL N/A 03/15/2022   Procedure: ESOPHAGOGASTRODUODENOSCOPY (EGD) WITH PROPOFOL;  Surgeon: Wyline Mood, MD;  Location: [facility] END[facility]COPY;  Service: Gastroenterology;  Laterality: N/A;   ESOPHAGOGASTRODUODENOSCOPY (EGD) WITH PROPOFOL N/A 04/18/2022   Procedure: ESOPHAGOGASTRODUODENOSCOPY (EGD) WITH PROPOFOL;  Surgeon: Toney Reil, MD;  Location: [facility]  ENDOSCOPY;  Service: Gastroenterology;  Laterality: N/A;   ESOPHAGOGASTRODUODENOSCOPY (EGD) WITH PROPOFOL N/A 08/09/2022   Procedure: ESOPHAGOGASTRODUODENOSCOPY (EGD) WITH PROPOFOL;  Surgeon: Wyline Mood, MD;  Location: [facility] END[facility]COPY;  Service: Gastroenterology;  Laterality: N/A;   GIVENS CAPSULE STUDY N/A 06/22/2021   Procedure: GIVENS CAPSULE STUDY;  Surgeon: Toledo, Boykin Nearing, MD;  Location: [facility];  Service: Gastroenterology;  Laterality: N/A;   IR NEPHROSTOMY EXCHANGE LEFT  02/25/2022   IR NEPHROSTOMY EXCHANGE LEFT  03/27/2022   IR NEPHROSTOMY EXCHANGE LEFT  05/22/2022   IR NEPHROSTOMY EXCHANGE LEFT  07/24/2022   IR NEPHROSTOMY EXCHANGE LEFT  09/24/2022   IR NEPHROSTOMY EXCHANGE LEFT  11/21/2022   IR NEPHROSTOMY EXCHANGE LEFT  02/17/2023   IR NEPHROSTOMY EXCHANGE LEFT  04/06/2023   IR NEPHROSTOMY EXCHANGE LEFT  05/27/2023   IR NEPHROSTOMY EXCHANGE LEFT  07/28/2023   IR NEPHROSTOMY PLACEMENT LEFT  12/27/2021   IVC FILTER INSERTION N/A 08/18/2020   Procedure: IVC FILTER INSERTION;  Surgeon: Renford Dills, MD;  Location: [facility];  Service: Cardiovascular;  Laterality: N/A;   PERIPHERAL VASCULAR THROMBECTOMY Bilateral 01/03/2021   Procedure: PERIPHERAL VASCULAR THROMBECTOMY;  Surgeon: Annice Needy, MD;  Location: [facility];  Service: Cardiovascular;  Laterality: Bilateral;   PULMONARY THROMBECTOMY N/A 08/18/2020   Procedure: PULMONARY THROMBECTOMY;  Surgeon: Renford Dills, MD;  Location: [facility];  Service: Cardiovascular;  Laterality: N/A;   TUBAL LIGATION     VISCERAL ANGIOGRAPHY N/A 07/18/2021  Procedure: VISCERAL ANGIOGRAPHY;  Surgeon: Annice Needy, MD;  Location: [facility];  Service: Cardiovascular;  Laterality: N/A;    No Known Allergies  Prior to Admission medications   Medication Sig Start Date End Date Taking? Authorizing Provider  acetaminophen (TYLENOL) 500 MG tablet Take 500 mg by mouth every 6 (six) hours as needed for  mild pain or fever.   Yes [provider]  albuterol (VENTOLIN HFA) 108 (90 Base) MCG/ACT inhaler Inhale 2 puffs into the lungs every 6 (six) hours as needed for wheezing or shortness of breath. Patient taking differently: Inhale 2 puffs into the lungs every 4 (four) hours as needed for wheezing or shortness of breath. 10/26/22  Yes Marrion Coy, MD  Ascorbic Acid (VITAMIN C) 1000 MG tablet Take 1,000 mg by mouth daily.   Yes [provider]  budesonide-formoterol (SYMBICORT) 80-4.5 MCG/ACT inhaler Inhale 1 puff into the lungs 2 (two) times daily.   Yes [provider]  cyanocobalamin 100 MCG tablet Take 100 mcg by mouth daily.   Yes [provider]  FEROSUL 325 (65 Fe) MG tablet Take 325 mg by mouth daily with breakfast. 08/14/22  Yes [provider]  fluticasone furoate-vilanterol (BREO ELLIPTA) 100-25 MCG/ACT AEPB Inhale 1 puff into the lungs daily.   Yes [provider]  furosemide (LASIX) 20 MG tablet Take 20 mg by mouth daily. 04/15/23  Yes [provider]  Multiple Vitamin (MULTIVITAMIN WITH MINERALS) TABS tablet Take 1 tablet by mouth daily. 09/12/20  Yes Standley Brooking, MD  pantoprazole (PROTONIX) 40 MG tablet Take 1 tablet (40 mg total) by mouth 2 (two) times daily. 02/12/23 08/05/23 Yes Arnetha Courser, MD  rosuvastatin (CRESTOR) 10 MG tablet TAKE 1 TABLET(10 MG) BY MOUTH DAILY 07/24/23  Yes Clarisa Kindred A, FNP  simethicone (MYLICON) 80 MG chewable tablet Chew 1 tablet (80 mg total) by mouth every 6 (six) hours as needed for flatulence. 04/08/23  Yes Wouk, Wilfred Curtis, MD    Social History   Socioeconomic History   Marital status: Single    Spouse name: Not on file   Number of children: Not on file   Years of education: Not on file   Highest education level: Not on file  Occupational History   Not on file  Tobacco Use   Smoking status: Never   Smokeless tobacco: Never   Tobacco comments:    smoked 2-4 cigarettes for about  2-3 weeks   Vaping Use   Vaping status: Never Used  Substance and Sexual Activity   Alcohol use: Not Currently   Drug use: Never   Sexual activity: Not Currently  Other Topics Concern   Not on file  Social History Narrative   Not on file   Social Drivers of Health   Financial Resource Strain: Not on file  Food Insecurity: No Food Insecurity (08/05/2023)   Hunger Vital Sign    Worried About Running Out of Food in the Last Year: Never true    Ran Out of Food in the Last Year: Never true  Transportation Needs: No Transportation Needs (08/05/2023)   PRAPARE - Administrator, Civil Service (Medical): No    Lack of Transportation (Non-Medical): No  Physical Activity: Not on file  Stress: Not on file  Social Connections: Unknown (08/05/2023)   Social Connection and Isolation Panel [NHANES]    Frequency of Communication with Friends and Family: More than three times a week    Frequency of Social  Gatherings with Friends and Family: Three times a week    Attends Religious Services: Never    Active Member of Clubs or Organizations: No    Attends Banker Meetings: Never    Marital Status: Patient declined  Intimate Partner Violence: Not At Risk (08/05/2023)   Humiliation, Afraid, Rape, and Kick questionnaire    Fear of Current or Ex-Partner: No    Emotionally Abused: No    Physically Abused: No    Sexually Abused: No     Family History  Problem Relation Age of Onset   Hypertension Mother    Diabetes Mother    Diabetes Father    Hypertension Father    High Cholesterol Father    Congestive Heart Failure Father    Breast cancer Cousin     ROS: Otherwise negative unless mentioned in HPI  Physical Examination  Vitals:   08/05/23 1000 08/05/23 1030  BP: (!) 123/58 122/63  Pulse: 69 73  Resp: 18   Temp:    SpO2: 98% 97%   Body mass index is 44.88 kg/m.  General:  WDWN in NAD Gait: Not observed HENT: WNL, normocephalic Pulmonary: normal  non-labored breathing while at rest, without Rales, rhonchi,  wheezing Cardiac: regular, with Positive Murmurs,  no rubs or gallops; without carotid bruits Abdomen: Positive bowel sounds throughout, soft, NT/ND, no masses Skin: without rashes Vascular Exam/Pulses: Bilateral lower extremities with +4 edema and lymphedema.  Pain on palpitation of the right calf.  Unable to palpate pulses due to +4 edema.  Bilateral lower extremities warm to touch. Extremities: without ischemic changes, without Gangrene , without cellulitis; without open wounds;  Musculoskeletal: no muscle wasting or atrophy  Neurologic: A&O X 3;  No focal weakness or paresthesias are detected; speech is fluent/normal Psychiatric:  The pt has Normal affect. Lymph:  Unremarkable  CBC    Component Value Date/Time   WBC 3.3 (L) 08/05/2023 0638   RBC 2.81 (L) 08/05/2023 0638   HGB 7.8 (L) 08/05/2023 0638   HGB 9.8 (L) 03/24/2023 1351   HCT 25.8 (L) 08/05/2023 0638   PLT 293 08/05/2023 0638   PLT 256 03/24/2023 1351   MCV 91.8 08/05/2023 0638   MCH 27.8 08/05/2023 0638   MCHC 30.2 08/05/2023 0638   RDW 15.9 (H) 08/05/2023 0638   LYMPHSABS 0.7 07/23/2023 1910   MONOABS 0.3 07/23/2023 1910   EOSABS 0.1 07/23/2023 1910   BASOSABS 0.0 07/23/2023 1910    BMET    Component Value Date/Time   NA 139 08/05/2023 0638   K 3.8 08/05/2023 0638   CL 106 08/05/2023 0638   CO2 21 (L) 08/05/2023 0638   GLUCOSE 215 (H) 08/05/2023 0638   BUN 22 08/05/2023 0638   CREATININE 0.88 08/05/2023 0638   CALCIUM 8.8 (L) 08/05/2023 0638   GFRNONAA >60 08/05/2023 1610    COAGS: Lab Results  Component Value Date   INR 1.1 05/27/2023   INR 1.1 11/21/2022   INR 1.1 10/24/2022     Non-Invasive Vascular Imaging:   EXAM:08/04/23 CT ANGIOGRAPHY CHEST WITH CONTRAST   TECHNIQUE: Multidetector CT imaging of the chest was performed using the standard protocol during bolus administration of intravenous contrast. Multiplanar CT image  reconstructions and MIPs were obtained to evaluate the vascular anatomy.   RADIATION DOSE REDUCTION: This exam was performed according to the departmental dose-optimization program which includes automated exposure control, adjustment of the mA and/or kV according to patient size and/or use of iterative reconstruction technique.   CONTRAST:  75mL OMNIPAQUE IOHEXOL 350 MG/ML SOLN   COMPARISON:  CT angiogram chest 07/24/2023.   FINDINGS: Cardiovascular: Small segmental right lower lobe pulmonary emboli are seen. Heart is mildly enlarged. Aorta is normal in size.   Mediastinum/Nodes: No enlarged mediastinal, hilar, or axillary lymph nodes. Thyroid gland, trachea, and esophagus demonstrate no significant findings.   Lungs/Pleura: There are areas of air trapping throughout both lungs similar to the prior study. There is atelectasis in the bilateral lower lobes and lingula. There is no pleural effusion or pneumothorax. Trachea and central airways are patent.   Upper Abdomen: No acute abnormality.   Musculoskeletal: No chest wall abnormality. No acute or significant osseous findings.   Review of the MIP images confirms the above findings.   IMPRESSION: 1. Small segmental right lower lobe pulmonary emboli. No right heart strain. 2. Bilateral lower lobe and lingular atelectasis. 3. Stable areas of air trapping throughout both lungs. 4. Mild cardiomegaly.  Statin:  Yes.   Beta Blocker:  No. Aspirin:  No. ACEI:  No. ARB:  No. CCB use:  No Other antiplatelets/anticoagulants:  No. Unable to tolerate Anticoagulation    ASSESSMENT/PLAN: This is a 68 y.o. female who has a significant medical history for recurrent PE/DVT with an IVC filter placed.  At this time she is unable to tolerate any anticoagulation.  She was started on a heparin infusion in the emergency department due to a diagnosis of pulmonary embolism with decreasing hemoglobins therefore heparin infusion was stopped.   Patient does complain of right lower calf pain today on exam.  PLAN Vascular surgery has reviewed the patient's CTA and at this time her pulmonary embolism is very small without right heart strain.  Therefore we do not recommend a pulmonary thrombectomy due to the minimal thrombosis and the fact that she is unable to except anticoagulation and therefore would rethrombosis most likely within 24 hours.  We do recommend bilateral lower extremity DVT PE ultrasounds to assess her right lower extremity pain.  If this were new or extended DVTs the treatment for this would be anticoagulation and being that she is unable to tolerate this she would also not be a candidate for a lower extremity thrombectomy as she would rethrombosis within 24 hours.  Patient does have an IVC filter which most likely has some thrombosis but the treatment for any lower extremity DVTs that would cause the pulmonary embolisms is anticoagulation.  If a full workup to identify patients bleeding could be found and fixed we would recommend she be placed on a trial of anticoagulation and see if her hemoglobin stabilizes.   -I discussed the plan in detail with Dr. Festus Barren MD and he agrees with plan   Marcie Bal [facility] 08/05/2023 1:29 PM

## 2023-08-05 NOTE — Progress Notes (Signed)
PHARMACY - ANTICOAGULATION CONSULT NOTE  Pharmacy Consult for Heparin  Indication: pulmonary embolus  No Known Allergies  Patient Measurements: Height: 4\' 11"  (149.9 cm) Weight: 100.8 kg (222 lb 3.6 oz) IBW/kg (Calculated) : 43.2 Heparin Dosing Weight: 68 kg   Vital Signs: Temp: 98 F (36.7 C) (01/27 2121) Temp Source: Oral (01/27 2121) BP: 131/67 (01/28 0700) Pulse Rate: 85 (01/28 0700)  Labs: Recent Labs    08/04/23 1614 08/04/23 1811 08/05/23 0638  HGB 9.1*  --  7.8*  HCT 30.2*  --  25.8*  PLT 317  --  293  HEPARINUNFRC  --   --  0.49  CREATININE 0.84  --  0.88  TROPONINIHS 6 7  --     Estimated Creatinine Clearance: 63.9 mL/min (by C-G formula based on SCr of 0.88 mg/dL).   Medical History: Past Medical History:  Diagnosis Date   Acute bilateral deep vein thrombosis (DVT) of femoral veins (HCC) 01/02/2021   Acute massive pulmonary embolism (HCC) 08/25/2020   Last Assessment & Plan:  Formatting of this note might be different from the original. Post vena caval filter and on xarelto and O2   Anemia    ARF (acute respiratory failure) (HCC)    Cellulitis of left lower leg 06/20/2021   CHF (congestive heart failure) (HCC)    COVID-19    Diabetes mellitus without complication (HCC)    DVT (deep venous thrombosis) (HCC) 09/06/2020   Endometrial cancer (HCC)    GAVE (gastric antral vascular ectasia)    GERD (gastroesophageal reflux disease)    High cholesterol    Hx of blood clots    Hyperlipidemia    Hypertension    IDA (iron deficiency anemia) 09/21/2020   Obesity    Pneumonia 06/11/2022   recovered   Pressure injury of skin 06/21/2021   Pulmonary embolism (HCC)    Sepsis (HCC)    Thrombocytopenia (HCC)    Urinary tract infection 09/13/2021   Medications:  (Not in a hospital admission)  Assessment: Pharmacy consulted to dose heparin in this 67 year old female admitted with PE.  No prior anticoag noted. CrCl = 67 ml/min    Date Time HL Rate/Comment 1/28 0638 0.49 1100 u/hr, therapeutic x1  Hgb 7.8 (down from 9.1 yesterday), plt 293 - per RN, no signs/symptoms of bleeding.   Goal of Therapy:  Heparin level 0.3-0.7 units/ml Monitor platelets by anticoagulation protocol: Yes   Plan:  Continue heparin infusion at 1100 units/hour Check confirmatory level in 6 hours Monitor daily heparin levels while on heparin infusion Monitor CBC and signs/symptoms of bleeding  Thank you for involving pharmacy in this patient's care.   Rockwell Alexandria, PharmD Clinical Pharmacist 08/05/2023 7:48 AM

## 2023-08-05 NOTE — Progress Notes (Signed)
[facility]  - [place] [facility]   PATIENT NAME: Courtney Mack    MR#:  [id number]  DATE OF BIRTH:  1955/09/29  SUBJECTIVE:  no family at bedside. Patient came in with increasing shortness of breath at home. Lives with her daughter. Has chronic bilateral lower extremity edema and history of lower extremity DVT. Unable to take anticoagulation due to history of G.I. bleed. She started noticing right calf pain more for last couple days.   VITALS:  Blood pressure (!) 121/59, pulse 85, temperature 97.6 F (36.4 C), temperature source Oral, resp. rate 18, height 4\' 11"  (1.499 m), weight 100.8 kg, SpO2 96%.  PHYSICAL EXAMINATION:   GENERAL:  68 y.o.-year-old patient with no acute distress. Morbid obesity LUNGS: Normal breath sounds bilaterally, no wheezing CARDIOVASCULAR: S1, S2 normal. No murmur   ABDOMEN: Soft, nontender, nondistended. Bowel sounds present.  EXTREMITIES: bilateral large lower extremity NEUROLOGIC: nonfocal  patient is alert and awake   LABORATORY PANEL:  CBC Recent Labs  Lab 08/05/23 0638  WBC 3.3*  HGB 7.8*  HCT 25.8*  PLT 293    Chemistries  Recent Labs  Lab 08/05/23 0638  NA 139  K 3.8  CL 106  CO2 21*  GLUCOSE 215*  BUN 22  CREATININE 0.88  CALCIUM 8.8*   Cardiac Enzymes No results for input(s): "TROPONINI" in the last 168 hours. RADIOLOGY:  US Venous Img Lower Bilateral (DVT) Result Date: 08/05/2023 CLINICAL DATA:  Bilateral lower extremity pain and edema. History of cervical cancer and previous DVT. Evaluate for acute or chronic DVT. EXAM: BILATERAL LOWER EXTREMITY VENOUS DOPPLER ULTRASOUND TECHNIQUE: Gray-scale sonography with graded compression, as well as color Doppler and duplex ultrasound were performed to evaluate the lower extremity deep venous systems from the level of the common femoral vein and including the common femoral, femoral, profunda femoral, popliteal and calf veins including the posterior tibial,  peroneal and gastrocnemius veins when visible. The superficial great saphenous vein was also interrogated. Spectral Doppler was utilized to evaluate flow at rest and with distal augmentation maneuvers in the common femoral, femoral and popliteal veins. COMPARISON:  Bilateral lower extremity venous Doppler ultrasound-02/24/2022 FINDINGS: RIGHT LOWER EXTREMITY Common Femoral Vein: There is mixed echogenic nonocclusive wall thickening/DVT involving right common femoral vein (image 31 and 32). Saphenofemoral Junction: No evidence of thrombus. Normal compressibility and flow on color Doppler imaging. Profunda Femoral Vein: No evidence of thrombus. Normal compressibility and flow on color Doppler imaging. Femoral Vein: The right femoral vein appears atretic with mixed echogenic predominantly occlusive wall thickening/chronic DVT involving the proximal (images 42 and 43 mid (images 45 and 46 and distal (images 48 and 49) aspects of the femoral vein, similar to the 02/2022 examination. Popliteal Vein: There is mixed echogenic nonocclusive wall thickening involving the right popliteal vein (images 51 and 52), similar to the 02/2022 examination. Calf Veins: No evidence of thrombus. Normal compressibility and flow on color Doppler imaging. Superficial Great Saphenous Vein: No evidence of thrombus. Normal compressibility. Other Findings:  None. LEFT LOWER EXTREMITY Common Femoral Vein: No evidence of thrombus. Normal compressibility, respiratory phasicity and response to augmentation. Saphenofemoral Junction: No evidence of thrombus. Normal compressibility and flow on color Doppler imaging. Profunda Femoral Vein: No evidence of thrombus. Normal compressibility and flow on color Doppler imaging. Femoral Vein: No evidence of thrombus. Normal compressibility, respiratory phasicity and response to augmentation. Popliteal Vein: No evidence of thrombus. Normal compressibility, respiratory phasicity and response to augmentation. Calf  Veins: No evidence of thrombus. Normal  compressibility and flow on color Doppler imaging. Superficial Great Saphenous Vein: No evidence of thrombus. Normal compressibility. Other Findings:  None. IMPRESSION: 1. Chronic mixed occlusive and nonocclusive DVT extending from the right common femoral vein through the right popliteal vein, similar to the 02/2022 examination. 2. No evidence of acute DVT within the right lower extremity. 3. No evidence of acute or chronic DVT within the left lower extremity. Electronically Signed   By: Simonne Come M.D.   On: 08/05/2023 16:12   CT Angio Chest PE W and/or Wo Contrast Result Date: 08/04/2023 CLINICAL DATA:  High probability for PE.  Shortness of breath. EXAM: CT ANGIOGRAPHY CHEST WITH CONTRAST TECHNIQUE: Multidetector CT imaging of the chest was performed using the standard protocol during bolus administration of intravenous contrast. Multiplanar CT image reconstructions and MIPs were obtained to evaluate the vascular anatomy. RADIATION DOSE REDUCTION: This exam was performed according to the departmental dose-optimization program which includes automated exposure control, adjustment of the mA and/or kV according to patient size and/or use of iterative reconstruction technique. CONTRAST:  75mL OMNIPAQUE IOHEXOL 350 MG/ML SOLN COMPARISON:  CT angiogram chest 07/24/2023. FINDINGS: Cardiovascular: Small segmental right lower lobe pulmonary emboli are seen. Heart is mildly enlarged. Aorta is normal in size. Mediastinum/Nodes: No enlarged mediastinal, hilar, or axillary lymph nodes. Thyroid gland, trachea, and esophagus demonstrate no significant findings. Lungs/Pleura: There are areas of air trapping throughout both lungs similar to the prior study. There is atelectasis in the bilateral lower lobes and lingula. There is no pleural effusion or pneumothorax. Trachea and central airways are patent. Upper Abdomen: No acute abnormality. Musculoskeletal: No chest wall abnormality. No  acute or significant osseous findings. Review of the MIP images confirms the above findings. IMPRESSION: 1. Small segmental right lower lobe pulmonary emboli. No right heart strain. 2. Bilateral lower lobe and lingular atelectasis. 3. Stable areas of air trapping throughout both lungs. 4. Mild cardiomegaly. Electronically Signed   By: Darliss Cheney M.D.   On: 08/04/2023 21:29   DG Chest 2 View Result Date: 08/04/2023 CLINICAL DATA:  Shortness of breath. EXAM: CHEST - 2 VIEW COMPARISON:  None Available. FINDINGS: Low lung volume. Bilateral lung fields are clear. Bilateral costophrenic angles are clear. Elevated right hemidiaphragm noted. Normal cardio-mediastinal silhouette. No acute osseous abnormalities. The soft tissues are within normal limits. Partially seen presumed IVC filter. IMPRESSION: No active cardiopulmonary disease. Electronically Signed   By: Jules Schick M.D.   On: 08/04/2023 16:30    Assessment and Plan  Courtney Mack is a 68 y.o. female with medical history significant of recurrent PE/DVT s/p IVC (not on AC due to recurrent GI hemorrhage), HFpEF, COPD, left-sided hydronephrosis s/p nephrostomy tube placement, hypertension, type 2 diabetes, hypertension, hyperlipidemia, who presents to the ED due to shortness of breath.   CTA was obtained demonstrating small segmental PE with no right strain. Patient started on heparin.   Acute pulmonary embolism (HCC) chronic right lower extremity DVT Per chart review, patient has a chronic history of shortness of breath that was also noted during recent hospitalization approximately 2 weeks ago, at which time CTA was negative.  Today, CTA is demonstrating new small segmental right lower lobe embolism with no evidence of right heart strain.  Given she has an IVC and has poorly tolerated anticoagulation in the past, will discuss with vascular surgery.  - Vascular surgery consulted; appreciate their recommendations - Continue heparin per  pharmacy dosing -- repeat bilateral lower extremity ultrasound shows chronic DVT. Nothing new. --  Hemoglobin 9.1--7.8 -- discussed with Dr. Wyn Quaker. Patient is not a candidate for any vascular procedure, given high risk for G.I. bleed anticoagulation will be difficult. Patient does voice understanding. No respiratory distress sats stable.  -- Discussed with patient regarding risk and benefits for blood thinners. She wants her discussed with her daughter. Will continue heparin drip for now  H/o GAVE syndrome with history of severe G.I. bleed in the past requiring multiple blood transfusions -- patient has significant G.I. bleed. She has required multiple endoscopy's and multiple blood transfusions in the past along with endoscopy and APC  -- her hemoglobin is dropped to 7.8.   COPD (chronic obstructive pulmonary disease) (HCC) Initial concern for COPD exacerbation, however pulmonary examination is reassuring with no evidence of wheezing. - Continue DuoNebs as needed - Continue home bronchodilators - Hold off on further systemic steroids   Hypotension - Low threshold to restart home midodrine if needed   Chronic diastolic CHF (congestive heart failure) (HCC) No evidence of hypervolemia on examination.  - Continue home Lasix   Lymphedema Chronic lymphedema noted on examination that is stable.     Hypokalemia - One-time dose of K-Dur ordered   Advance Care Planning:   Code Status: Full Code   Consults: Vascular surgery   Family Communication: none present in the ER. Patient will discuss with her daughter when she comes.  DVT Prophylaxis : currently on heparin drip Level of care: Telemetry Medical Status is: Observation The patient remains OBS appropriate and will d/c before 2 midnights.    TOTAL TIME TAKING CARE OF THIS PATIENT: 45 minutes.  >50% time spent on counselling and coordination of care  Note: This dictation was prepared with Dragon dictation along with smaller phrase  technology. Any transcriptional errors that result from this process are unintentional.  Enedina Finner M.D    [facility]   CC: Primary care physician; [facility]

## 2023-08-05 NOTE — ED Notes (Signed)
RN attempted to collect heparin level. Called lab to come collect level.

## 2023-08-05 NOTE — ED Notes (Signed)
Patient returned from Ultrasound.

## 2023-08-06 DIAGNOSIS — I2699 Other pulmonary embolism without acute cor pulmonale: Secondary | ICD-10-CM | POA: Diagnosis not present

## 2023-08-06 LAB — CBC
HCT: 22.6 % — ABNORMAL LOW (ref 36.0–46.0)
Hemoglobin: 7 g/dL — ABNORMAL LOW (ref 12.0–15.0)
MCH: 28.5 pg (ref 26.0–34.0)
MCHC: 31 g/dL (ref 30.0–36.0)
MCV: 91.9 fL (ref 80.0–100.0)
Platelets: 272 10*3/uL (ref 150–400)
RBC: 2.46 MIL/uL — ABNORMAL LOW (ref 3.87–5.11)
RDW: 15.9 % — ABNORMAL HIGH (ref 11.5–15.5)
WBC: 4.8 10*3/uL (ref 4.0–10.5)
nRBC: 0 % (ref 0.0–0.2)

## 2023-08-06 LAB — HEPARIN LEVEL (UNFRACTIONATED): Heparin Unfractionated: 0.45 [IU]/mL (ref 0.30–0.70)

## 2023-08-06 LAB — GLUCOSE, CAPILLARY
Glucose-Capillary: 122 mg/dL — ABNORMAL HIGH (ref 70–99)
Glucose-Capillary: 144 mg/dL — ABNORMAL HIGH (ref 70–99)
Glucose-Capillary: 82 mg/dL (ref 70–99)
Glucose-Capillary: 137 mg/dL — ABNORMAL HIGH (ref 70–99)

## 2023-08-06 LAB — HEMOGLOBIN A1C
Hgb A1c MFr Bld: 4.5 % — ABNORMAL LOW (ref 4.8–5.6)
Mean Plasma Glucose: 82.45 mg/dL

## 2023-08-06 LAB — PREPARE RBC (CROSSMATCH)

## 2023-08-06 MED ORDER — SODIUM CHLORIDE 0.9% IV SOLUTION: Freq: Once | INTRAVENOUS | Status: AC

## 2023-08-06 MED ORDER — INSULIN ASPART 100 UNIT/ML IJ SOLN
0.0000 [IU] | Freq: Three times a day (TID) | INTRAMUSCULAR | Status: DC
  Administered 2023-08-06 – 2023-08-10 (×6): 2 [IU] via SUBCUTANEOUS
  Filled 2023-08-06 (×6): qty 1

## 2023-08-06 NOTE — Progress Notes (Signed)
PHARMACY - ANTICOAGULATION CONSULT NOTE  Pharmacy Consult for Heparin  Indication: pulmonary embolus  No Known Allergies  Patient Measurements: Height: 4\' 11"  (149.9 cm) Weight: 100.8 kg (222 lb 3.6 oz) IBW/kg (Calculated) : 43.2 Heparin Dosing Weight: 68 kg   Vital Signs: Temp: 97.6 F (36.4 C) (01/29 0431) Temp Source: Oral (01/29 0431) BP: 106/42 (01/29 0431) Pulse Rate: 70 (01/29 0431)  Labs: Recent Labs    08/04/23 1614 08/04/23 1811 08/05/23 0638 08/05/23 1312 08/06/23 0529  HGB 9.1*  --  7.8*  --  7.0*  HCT 30.2*  --  25.8*  --  22.6*  PLT 317  --  293  --  272  HEPARINUNFRC  --   --  0.49 0.51 0.45  CREATININE 0.84  --  0.88  --   --   TROPONINIHS 6 7  --   --   --     Estimated Creatinine Clearance: 63.9 mL/min (by C-G formula based on SCr of 0.88 mg/dL).   Medical History: Past Medical History:  Diagnosis Date   Acute bilateral deep vein thrombosis (DVT) of femoral veins (HCC) 01/02/2021   Acute massive pulmonary embolism (HCC) 08/25/2020   Last Assessment & Plan:  Formatting of this note might be different from the original. Post vena caval filter and on xarelto and O2   Anemia    ARF (acute respiratory failure) (HCC)    Cellulitis of left lower leg 06/20/2021   CHF (congestive heart failure) (HCC)    COVID-19    Diabetes mellitus without complication (HCC)    DVT (deep venous thrombosis) (HCC) 09/06/2020   Endometrial cancer (HCC)    GAVE (gastric antral vascular ectasia)    GERD (gastroesophageal reflux disease)    High cholesterol    Hx of blood clots    Hyperlipidemia    Hypertension    IDA (iron deficiency anemia) 09/21/2020   Obesity    Pneumonia 06/11/2022   recovered   Pressure injury of skin 06/21/2021   Pulmonary embolism (HCC)    Sepsis (HCC)    Thrombocytopenia (HCC)    Urinary tract infection 09/13/2021   Medications:  Medications Prior to Admission  Medication Sig Dispense Refill Last Dose/Taking   acetaminophen  (TYLENOL) 500 MG tablet Take 500 mg by mouth every 6 (six) hours as needed for mild pain or fever.   Taking As Needed   albuterol (VENTOLIN HFA) 108 (90 Base) MCG/ACT inhaler Inhale 2 puffs into the lungs every 6 (six) hours as needed for wheezing or shortness of breath. (Patient taking differently: Inhale 2 puffs into the lungs every 4 (four) hours as needed for wheezing or shortness of breath.) 8 g 0 Taking Differently   Ascorbic Acid (VITAMIN C) 1000 MG tablet Take 1,000 mg by mouth daily.   Taking   budesonide-formoterol (SYMBICORT) 80-4.5 MCG/ACT inhaler Inhale 1 puff into the lungs 2 (two) times daily.   Taking   cyanocobalamin 100 MCG tablet Take 100 mcg by mouth daily.   Taking   FEROSUL 325 (65 Fe) MG tablet Take 325 mg by mouth daily with breakfast.   Taking   fluticasone furoate-vilanterol (BREO ELLIPTA) 100-25 MCG/ACT AEPB Inhale 1 puff into the lungs daily.   Taking   furosemide (LASIX) 20 MG tablet Take 20 mg by mouth daily.   Taking   Multiple Vitamin (MULTIVITAMIN WITH MINERALS) TABS tablet Take 1 tablet by mouth daily.   Taking   pantoprazole (PROTONIX) 40 MG tablet Take 1 tablet (40 mg total)  by mouth 2 (two) times daily. 60 tablet 1 Taking   rosuvastatin (CRESTOR) 10 MG tablet TAKE 1 TABLET(10 MG) BY MOUTH DAILY 30 tablet 0 Taking   simethicone (MYLICON) 80 MG chewable tablet Chew 1 tablet (80 mg total) by mouth every 6 (six) hours as needed for flatulence. 30 tablet 0 Taking As Needed   Assessment: Pharmacy consulted to dose heparin in this 67 year old female admitted with PE.  No prior anticoag noted. CrCl = 67 ml/min   Date Time HL Rate/Comment 1/28 0638 0.49 1100 u/hr, therapeutic x1 1/28 1312 0.51 1100 u/hr, therapeutic x2 1/29     0529   0.45     1100  u/hr, therapeutic x3  Hgb 7.8 (down from 9.1 yesterday), plt 293 - per RN, no signs/symptoms of bleeding.   Goal of Therapy:  Heparin level 0.3-0.7 units/ml Monitor platelets by anticoagulation protocol: Yes   Plan:   Continue heparin infusion at 1100 units/hour Recheck HL tomorrow with morning labs Monitor CBC and signs/symptoms of bleeding  Thank you for involving pharmacy in this patient's care.   Scherrie Gerlach, PharmD Clinical Pharmacist 08/06/2023 6:35 AM

## 2023-08-06 NOTE — Progress Notes (Signed)
Progress Note   Patient: Courtney Mack UVO:[id number] DOB: 14-Sep-1955 DOA: 08/04/2023     0 DOS: the patient was seen and examined on 08/06/2023   Brief hospital course: 68yo with h/o GAVE syndrome, recurrent VTE s/p IVC filter (not on AC due to h/o GI bleeding), chronic diastolic CHF, COPD, left-sided hydronephrosis s/p nephrostomy tube placement, DM, HTN, and HLD who presented on 1/27 with SOB and was found to have a subsegmental PE.  She was also recently hospitalized (1/15-21) for complicated UTI, treated with ceftriaxone.  She was started on heparin but had decreasing Hgb and so this was stopped.  Vascular surgery consulted and recommended LE DVT US to determine if thrombectomy is needed; B [place] + for chronic mixed occlusive and nonocclusive DVT from R femoral to popliteal vein, similar to 02/2022.    Assessment and Plan:  Acute pulmonary embolism  Patient with known prior VTE, recurrent Has IVC filter Unable to take [facility] due to GAVE syndrome and recurrent GI bleeding Presented with SOB CTA with new subsegmental PE without R heart strain Discussed with vascular surgery - no indication for pulmonary thrombectomy but she has known chronic right lower extremity DVT and DVT US shows chronicity Vascular surgery consulted; patient is not a candidate for any vascular procedure Heparin started but she developed markedly worsening Hgb and so it was stopped Recommend palliative care consult, as she is in a very difficult position  ABLA Presumed recurrent GI bleeding in the setting of GAVE syndrome and started on heparin with dropping HGB, likely bleeding Normocytic, iron does not appear to be indicated (she reported that she was due to infusion but will hold) Hematology consulted   H/o GAVE syndrome  History of severe GI bleeding in the past requiring multiple blood transfusions Hgb down from 9.1 to 7.0 Transfusing, as above No current frank bleeding but it is presumed currently  Recurrent  UTI No symptoms currently but she was recently hospitalized She reports 7 UTIs this year Will message her urologist (Dr. Lonna Cobb) re: starting prophylaxis   COPD (chronic obstructive pulmonary disease) Initial concern for COPD exacerbation, however pulmonary examination is reassuring with no evidence of wheezing. Continue DuoNebs as needed Continue home bronchodilators Hold off on further systemic steroids   Hypotension Low threshold to restart home midodrine if needed   Chronic diastolic CHF (congestive heart failure)  No evidence of hypervolemia on examination Continue home Lasix   Lymphedema Chronic lymphedema noted on examination that is stable.      Consultants: Vascular surgery Oncology PT OT  Procedures: DVT US 1/28  Antibiotics: None  30 Day Unplanned Readmission Risk Score    Flowsheet Row ED to Hosp-Admission (Discharged) from [facility]/23/2023 in [facility]  30 Day Unplanned Readmission Risk Score (%) 35.17 Filed at 07/29/2023 1600       This score is the patient's risk of an unplanned readmission within 30 days of being discharged (0 -100%). The score is based on dignosis, age, lab data, medications, orders, and past utilization.   Low:  0-14.9   Medium: 15-21.9   High: 22-29.9   Extreme: 30 and above           Subjective: Concerned about the situation.  Agrees with stopping heparin.  Ok with blood transfusion.  Would also like IV iron.   Objective: Vitals:   08/06/23 0817 08/06/23 1633  BP: (!) 114/51 (!) 104/39  Pulse: 66 73  Resp: 20 (!) 24  Temp: (!) 97.5 F (  36.4 C) 98.3 F (36.8 C)  SpO2: 100% 98%    Intake/Output Summary (Last 24 hours) at 08/06/2023 1911 Last data filed at 08/06/2023 1401 Gross per 24 hour  Intake 482.72 ml  Output 1350 ml  Net -867.28 ml   Filed Weights   08/04/23 1542  Weight: 100.8 kg    Exam:  General:  Appears calm and comfortable and is in NAD Eyes:   EOMI, normal  lids, iris ENT:  grossly normal hearing, lips & tongue, mmm Neck:  no LAD, masses or thyromegaly Cardiovascular:  RRR, no m/r/g. No LE edema.  Respiratory:   CTA bilaterally with no wheezes/rales/rhonchi.  Normal respiratory effort. Abdomen:  soft, NT, ND; L nephrostomy tube with clear yellow urine Skin:  no rash or induration seen on limited exam Musculoskeletal:  grossly normal tone BUE/BLE, good ROM, no bony abnormality Psychiatric:  grossly normal mood and affect, speech fluent and appropriate, AOx3 Neurologic:  CN 2-12 grossly intact, moves all extremities in coordinated fashion  Data Reviewed: I have reviewed the patient's lab results since admission.  Pertinent labs for today include:   Hgb 7, down from 7.8 on 1/28, 9.1 on 1/27     Family Communication: None present; I spoke with her daughter by telephone  Disposition: Status is: Observation The patient remains OBS appropriate and will d/c before 2 midnights.     Time spent: 50 minutes  Unresulted Labs (From admission, onward)     Start     Ordered   08/07/23 0500  CBC with Differential/Platelet  Tomorrow morning,   R        08/06/23 1911   08/07/23 0500  Basic metabolic panel  Tomorrow morning,   R        08/06/23 1911   08/06/23 1852  Type and screen [facility]  (Blood Administration Adult)  Once,   R       Comments: [facility]    08/06/23 1852             Author: Jonah Blue, MD 08/06/2023 7:11 PM  For on call review [email or web address].

## 2023-08-06 NOTE — Hospital Course (Addendum)
15XW with h/o GAVE syndrome, recurrent VTE s/p IVC filter (not on AC due to h/o GI bleeding), chronic diastolic CHF, COPD, left-sided hydronephrosis s/p nephrostomy tube placement, DM, HTN, and HLD who presented on 1/27 with SOB and was found to have a subsegmental PE.  She was also recently hospitalized (1/15-21) for complicated UTI, treated with ceftriaxone.  She was started on heparin but had decreasing Hgb and so this was stopped.  Vascular surgery consulted and recommended LE DVT US to determine if thrombectomy is needed; B [place] + for chronic mixed occlusive and nonocclusive DVT from R femoral to popliteal vein, similar to 02/2022.  No vascular intervention indicated.  Oncology consulted and recommend BID Lovenox 30 mg with IV iron infusions.  Stable Hgb, possibly home 2/2 with home health.

## 2023-08-06 NOTE — Care Management Obs Status (Signed)
MEDICARE OBSERVATION STATUS NOTIFICATION   Patient Details  Name: Courtney Mack MRN: [id number] Date of Birth: Apr 07, 1956   Medicare Observation Status Notification Given:  Orland Dec, CMA 08/06/2023, 10:01 AM

## 2023-08-07 DIAGNOSIS — I825Y1 Chronic embolism and thrombosis of unspecified deep veins of right proximal lower extremity: Secondary | ICD-10-CM | POA: Diagnosis not present

## 2023-08-07 DIAGNOSIS — I2693 Single subsegmental pulmonary embolism without acute cor pulmonale: Secondary | ICD-10-CM | POA: Diagnosis present

## 2023-08-07 DIAGNOSIS — I2699 Other pulmonary embolism without acute cor pulmonale: Secondary | ICD-10-CM | POA: Diagnosis present

## 2023-08-07 DIAGNOSIS — Z515 Encounter for palliative care: Secondary | ICD-10-CM | POA: Diagnosis not present

## 2023-08-07 DIAGNOSIS — Z8249 Family history of ischemic heart disease and other diseases of the circulatory system: Secondary | ICD-10-CM | POA: Diagnosis not present

## 2023-08-07 DIAGNOSIS — I110 Hypertensive heart disease with heart failure: Secondary | ICD-10-CM | POA: Diagnosis present

## 2023-08-07 DIAGNOSIS — D500 Iron deficiency anemia secondary to blood loss (chronic): Secondary | ICD-10-CM | POA: Diagnosis not present

## 2023-08-07 DIAGNOSIS — D62 Acute posthemorrhagic anemia: Secondary | ICD-10-CM | POA: Diagnosis present

## 2023-08-07 DIAGNOSIS — Z7951 Long term (current) use of inhaled steroids: Secondary | ICD-10-CM | POA: Diagnosis not present

## 2023-08-07 DIAGNOSIS — Z833 Family history of diabetes mellitus: Secondary | ICD-10-CM | POA: Diagnosis not present

## 2023-08-07 DIAGNOSIS — E7800 Pure hypercholesterolemia, unspecified: Secondary | ICD-10-CM | POA: Diagnosis present

## 2023-08-07 DIAGNOSIS — Z936 Other artificial openings of urinary tract status: Secondary | ICD-10-CM | POA: Diagnosis not present

## 2023-08-07 DIAGNOSIS — I5032 Chronic diastolic (congestive) heart failure: Secondary | ICD-10-CM | POA: Diagnosis present

## 2023-08-07 DIAGNOSIS — Z6841 Body Mass Index (BMI) 40.0 and over, adult: Secondary | ICD-10-CM | POA: Diagnosis not present

## 2023-08-07 DIAGNOSIS — E876 Hypokalemia: Secondary | ICD-10-CM | POA: Diagnosis present

## 2023-08-07 DIAGNOSIS — N1330 Unspecified hydronephrosis: Secondary | ICD-10-CM | POA: Diagnosis present

## 2023-08-07 DIAGNOSIS — I959 Hypotension, unspecified: Secondary | ICD-10-CM | POA: Diagnosis present

## 2023-08-07 DIAGNOSIS — K31811 Angiodysplasia of stomach and duodenum with bleeding: Secondary | ICD-10-CM | POA: Diagnosis present

## 2023-08-07 DIAGNOSIS — Z792 Long term (current) use of antibiotics: Secondary | ICD-10-CM | POA: Diagnosis not present

## 2023-08-07 DIAGNOSIS — R0602 Shortness of breath: Secondary | ICD-10-CM | POA: Diagnosis present

## 2023-08-07 DIAGNOSIS — I82511 Chronic embolism and thrombosis of right femoral vein: Secondary | ICD-10-CM | POA: Diagnosis present

## 2023-08-07 DIAGNOSIS — J449 Chronic obstructive pulmonary disease, unspecified: Secondary | ICD-10-CM | POA: Diagnosis present

## 2023-08-07 DIAGNOSIS — Z8616 Personal history of COVID-19: Secondary | ICD-10-CM | POA: Diagnosis not present

## 2023-08-07 DIAGNOSIS — Z95828 Presence of other vascular implants and grafts: Secondary | ICD-10-CM | POA: Diagnosis not present

## 2023-08-07 DIAGNOSIS — Z1152 Encounter for screening for COVID-19: Secondary | ICD-10-CM | POA: Diagnosis not present

## 2023-08-07 DIAGNOSIS — I890 Lymphedema, not elsewhere classified: Secondary | ICD-10-CM | POA: Diagnosis present

## 2023-08-07 DIAGNOSIS — I82531 Chronic embolism and thrombosis of right popliteal vein: Secondary | ICD-10-CM | POA: Diagnosis present

## 2023-08-07 DIAGNOSIS — E119 Type 2 diabetes mellitus without complications: Secondary | ICD-10-CM | POA: Diagnosis present

## 2023-08-07 LAB — CBC WITH DIFFERENTIAL/PLATELET
Abs Immature Granulocytes: 0.01 10*3/uL (ref 0.00–0.07)
Basophils Absolute: 0 10*3/uL (ref 0.0–0.1)
Basophils Relative: 1 %
Eosinophils Absolute: 0.1 10*3/uL (ref 0.0–0.5)
Eosinophils Relative: 3 %
HCT: 27 % — ABNORMAL LOW (ref 36.0–46.0)
Hemoglobin: 8.5 g/dL — ABNORMAL LOW (ref 12.0–15.0)
Immature Granulocytes: 0 %
Lymphocytes Relative: 25 %
Lymphs Abs: 1 10*3/uL (ref 0.7–4.0)
MCH: 28 pg (ref 26.0–34.0)
MCHC: 31.5 g/dL (ref 30.0–36.0)
MCV: 88.8 fL (ref 80.0–100.0)
Monocytes Absolute: 0.4 10*3/uL (ref 0.1–1.0)
Monocytes Relative: 10 %
Neutro Abs: 2.6 10*3/uL (ref 1.7–7.7)
Neutrophils Relative %: 61 %
Platelets: 258 10*3/uL (ref 150–400)
RBC: 3.04 MIL/uL — ABNORMAL LOW (ref 3.87–5.11)
RDW: 16.1 % — ABNORMAL HIGH (ref 11.5–15.5)
WBC: 4.2 10*3/uL (ref 4.0–10.5)
nRBC: 0 % (ref 0.0–0.2)

## 2023-08-07 LAB — GLUCOSE, CAPILLARY
Glucose-Capillary: 94 mg/dL (ref 70–99)
Glucose-Capillary: 127 mg/dL — ABNORMAL HIGH (ref 70–99)
Glucose-Capillary: 120 mg/dL — ABNORMAL HIGH (ref 70–99)
Glucose-Capillary: 121 mg/dL — ABNORMAL HIGH (ref 70–99)

## 2023-08-07 LAB — BASIC METABOLIC PANEL
Anion gap: 8 (ref 5–15)
BUN: 29 mg/dL — ABNORMAL HIGH (ref 8–23)
CO2: 23 mmol/L (ref 22–32)
Calcium: 8.5 mg/dL — ABNORMAL LOW (ref 8.9–10.3)
Chloride: 107 mmol/L (ref 98–111)
Creatinine, Ser: 0.89 mg/dL (ref 0.44–1.00)
GFR, Estimated: >60 mL/min (ref >60)
Glucose, Bld: 101 mg/dL — ABNORMAL HIGH (ref 70–99)
Potassium: 3.8 mmol/L (ref 3.5–5.1)
Sodium: 138 mmol/L (ref 135–145)

## 2023-08-07 MED ORDER — IRON SUCROSE 200 MG IVPB - SIMPLE MED
200.0000 mg | Freq: Once | Status: AC
  Administered 2023-08-07: 200 mg via INTRAVENOUS
  Filled 2023-08-07: qty 200

## 2023-08-07 MED ORDER — ENOXAPARIN SODIUM 30 MG/0.3ML IJ SOSY
30.0000 mg | PREFILLED_SYRINGE | Freq: Two times a day (BID) | INTRAMUSCULAR | Status: DC
  Administered 2023-08-07 – 2023-08-10 (×6): 30 mg via SUBCUTANEOUS
  Filled 2023-08-07 (×6): qty 0.3

## 2023-08-07 MED ORDER — TRIMETHOPRIM 100 MG PO TABS
100.0000 mg | ORAL_TABLET | Freq: Every day | ORAL | Status: DC
Start: 2023-08-07 — End: 2023-08-10
  Administered 2023-08-07 – 2023-08-09 (×3): 100 mg via ORAL
  Filled 2023-08-07 (×5): qty 1

## 2023-08-07 NOTE — Plan of Care (Signed)

## 2023-08-07 NOTE — Progress Notes (Signed)
Progress Note   Patient: Courtney Mack WUJ:[id number] DOB: 30-Apr-1956 DOA: 08/04/2023     0 DOS: the patient was seen and examined on 08/07/2023   Brief hospital course: 68yo with h/o GAVE syndrome, recurrent VTE s/p IVC filter (not on AC due to h/o GI bleeding), chronic diastolic CHF, COPD, left-sided hydronephrosis s/p nephrostomy tube placement, DM, HTN, and HLD who presented on 1/27 with SOB and was found to have a subsegmental PE.  She was also recently hospitalized (1/15-21) for complicated UTI, treated with ceftriaxone.  She was started on heparin but had decreasing Hgb and so this was stopped.  Vascular surgery consulted and recommended LE DVT US to determine if thrombectomy is needed; B [place] + for chronic mixed occlusive and nonocclusive DVT from R femoral to popliteal vein, similar to 02/2022.    Assessment and Plan:  Acute pulmonary embolism  Patient with known prior VTE, recurrent Has IVC filter Unable to take [facility] due to GAVE syndrome and recurrent GI bleeding Presented with SOB CTA with new subsegmental PE without R heart strain Discussed with vascular surgery - no indication for pulmonary thrombectomy but she has known chronic right lower extremity DVT and DVT US shows chronicity Vascular surgery consulted; patient is not a candidate for any vascular procedure and replacing IVC filter would not likely help but would significantly increase her risk of a large PE peri-procedure Heparin started but she developed markedly worsening Hgb and so it was stopped Recommend palliative care consult, as she is in a very difficult position Oncology also consulted Consider ASA 81 mg daily if unable to tolerate AC; low-dose SQ heparin is also a consideration for now - will defer to oncology   ABLA Presumed recurrent GI bleeding in the setting of GAVE syndrome and started on heparin with dropping HGB, likely bleeding Normocytic, iron does not appear to be indicated (she reported that she was  due to infusion but will hold) Hematology consulted Hgb stable today, will continue to follow   H/o GAVE syndrome  History of severe GI bleeding in the past requiring multiple blood transfusions Hgb down from 9.1 to 7.0 to 8.5 Transfused 2 units PRBC No current frank bleeding but it is presumed currently   Recurrent UTI No symptoms currently but she was recently hospitalized She reports 7 UTIs this year Messaged her urologist (Dr. Lonna Cobb) re: starting prophylaxis - it is unlikely to be helpful in her scenario but if she wants to try it trimethoprim 100 mg daily is the best option After discussion, she would like to try it; she understands that it is unlikely to significantly help   COPD (chronic obstructive pulmonary disease) Initial concern for COPD exacerbation, however pulmonary examination is reassuring with no evidence of wheezing. Continue DuoNebs as needed Continue home bronchodilators Hold off on further systemic steroids   Hypotension Low threshold to restart home midodrine if needed   Chronic diastolic CHF (congestive heart failure)  No evidence of hypervolemia on examination Continue home Lasix   Lymphedema Chronic lymphedema noted on examination that is stable.         Consultants: Vascular surgery Oncology PT OT   Procedures: DVT US 1/28   Antibiotics: None  30 Day Unplanned Readmission Risk Score    Flowsheet Row ED to Hosp-Admission (Discharged) from 07/23/2023 in [facility]  30 Day Unplanned Readmission Risk Score (%) 35.17 Filed at 07/29/2023 1600       This score is the patient's risk of an  unplanned readmission within 30 days of being discharged (0 -100%). The score is based on dignosis, age, lab data, medications, orders, and past utilization.   Low:  0-14.9   Medium: 15-21.9   High: 22-29.9   Extreme: 30 and above           Subjective: Feeling much better.  Attempted to ambulate and reports that  she was winded just standing on the side of the bed today.     Objective: Vitals:   08/07/23 0820 08/07/23 1540  BP: (!) 110/57 (!) 109/57  Pulse: 66 83  Resp: 20 (!) 24  Temp: 97.7 F (36.5 C) 98.2 F (36.8 C)  SpO2: 99% 98%    Intake/Output Summary (Last 24 hours) at 08/07/2023 1731 Last data filed at 08/07/2023 1450 Gross per 24 hour  Intake 870 ml  Output 1800 ml  Net -930 ml   Filed Weights   08/04/23 1542  Weight: 100.8 kg    Exam:  General:  Appears calm and comfortable and is in NAD Eyes:   EOMI, normal lids, iris ENT:  grossly normal hearing, lips & tongue, mmm Neck:  no LAD, masses or thyromegaly Cardiovascular:  RRR, no m/r/g. No LE edema.  Respiratory:   CTA bilaterally with no wheezes/rales/rhonchi.  Normal respiratory effort. Abdomen:  soft, NT, ND; L nephrostomy tube with clear yellow urine Skin:  no rash or induration seen on limited exam Musculoskeletal:  grossly normal tone BUE/BLE, good ROM, no bony abnormality Psychiatric:  grossly normal mood and affect, speech fluent and appropriate, AOx3 Neurologic:  CN 2-12 grossly intact, moves all extremities in coordinated fashion  Data Reviewed: I have reviewed the patient's lab results since admission.  Pertinent labs for today include:   Unremarkable BMP WBC 4.2 Hgb 8.5     Family Communication: Sister was present throughout evaluation  Disposition: Status is: Inpatient Admit - It is my clinical opinion that admission to INPATIENT is reasonable and necessary because of the expectation that this patient will require hospital care that crosses at least 2 midnights to treat this condition based on the medical complexity of the problems presented.  Given the aforementioned information, the predictability of an adverse outcome is felt to be significant.      Time spent: 50 minutes  Unresulted Labs (From admission, onward)     Start     Ordered   08/08/23 0500  CBC with Differential/Platelet   Tomorrow morning,   R        08/07/23 1731   08/08/23 0500  Basic metabolic panel  Tomorrow morning,   R        08/07/23 1731             Author: Jonah Blue, MD 08/07/2023 5:31 PM  For on call review [email or web address].

## 2023-08-07 NOTE — Evaluation (Signed)
Occupational Therapy Evaluation Patient Details Name: Courtney Mack MRN: [id number] DOB: Jun 15, 1956 Today's Date: 08/07/2023   History of Present Illness Patient is a 68 year old female with acute pulmonary embolism. 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 Recurrent UTI. History of GAVE syndrome, recurrent VTE s/p IVC filter, CHF, COPD, left-sided hydronephrosis s/p nephrostomy tube placement, DM, HTN, and HLD.   Clinical Impression   Pt was seen for PT/OT co-evaluation this date d/t pt's medical conditions and low activity tolerance. Prior to hospital admission, pt was living at home with her daughter and grandchildren who assist her has needed. Pt ambulatory with rollator use.  Pt presents to acute OT demonstrating impaired ADL performance and functional mobility 2/2 low activity tolerance from PE and weakness (See OT problem list for additional functional deficits). Pt currently requires Min A for bed mobility with increased time and cueing.Rest breaks between all activity, with ability to perform STS from EOB to RW with CGA and maintain 2-3 mins. Sp02 monitored throughout session 98-100% with HR up to 109 at most.  Pt would benefit from skilled OT services to address noted impairments and functional limitations (see below for any additional details) in order to maximize safety and independence while minimizing falls risk and caregiver burden. Do anticipate the need for follow up OT services upon acute hospital DC.        If plan is discharge home, recommend the following: A little help with walking and/or transfers;A lot of help with bathing/dressing/bathroom;Assistance with cooking/housework;Assist for transportation;Help with stairs or ramp for entrance    Functional Status Assessment  Patient has had a recent decline in their functional status and demonstrates the ability to make significant improvements in function in a reasonable and predictable  amount of time.  Equipment Recommendations  BSC/3in1    Recommendations for Other Services       Precautions / Restrictions Precautions Precautions: Fall Precaution Comments: LLQ nephrostomy tube Restrictions Weight Bearing Restrictions Per Provider Order: No      Mobility Bed Mobility Overal bed mobility: Needs Assistance Bed Mobility: Supine to Sit, Sit to Supine     Supine to sit: Min assist Sit to supine: Min assist   General bed mobility comments: assistance for trunk to sit upright and assistance for LE to return to bed    Transfers Overall transfer level: Needs assistance Equipment used: Rolling walker (2 wheels) Transfers: Sit to/from Stand Sit to Stand: Contact guard assist, From elevated surface           General transfer comment: CGA for safety with STS from EOB at elevated height to RW; sp02 monitored throughout and 98-100% with HR up to 109 at most      Balance Overall balance assessment: Needs assistance Sitting-balance support: Feet supported Sitting balance-Leahy Scale: Fair     Standing balance support: Bilateral upper extremity supported, Reliant on assistive device for balance Standing balance-Leahy Scale: Fair Standing balance comment: RW use                           ADL either performed or assessed with clinical judgement   ADL Overall ADL's : Needs assistance/impaired                     Lower Body Dressing: Maximal assistance;Bed level Lower Body Dressing Details (indicate cue type and reason): to don socks at bed level  General ADL Comments: likely MAX A for LB ADLs and Min/Mod A for UB ADLs d/t SOB     Vision         Perception         Praxis         Pertinent Vitals/Pain Pain Assessment Pain Assessment: No/denies pain     Extremity/Trunk Assessment Upper Extremity Assessment Upper Extremity Assessment: Generalized weakness   Lower Extremity Assessment Lower Extremity  Assessment: Generalized weakness       Communication Communication Communication: No apparent difficulties   Cognition Arousal: Alert Behavior During Therapy: WFL for tasks assessed/performed Overall Cognitive Status: Within Functional Limits for tasks assessed                                       General Comments  cues for energy conservation strategies and breathing techniques    Exercises Other Exercises Other Exercises: Edu on role of OT in acute setting and PLB technique.   Shoulder Instructions      Home Living Family/patient expects to be discharged to:: Private residence Living Arrangements: Children Available Help at Discharge: Family;Available 24 hours/day Type of Home: Apartment Home Access: Stairs to enter Entrance Stairs-Number of Steps: 1 Entrance Stairs-Rails: None Home Layout: One level     Bathroom Shower/Tub: Chief Strategy Officer: Standard Bathroom Accessibility: Yes   Home Equipment: Rollator (4 wheels);Cane - single point;BSC/3in1;Shower seat;Wheelchair - manual   Additional Comments: sponge bathes at baseline. sleeps in a recliner at home      Prior Functioning/Environment Prior Level of Function : Needs assist             Mobility Comments: amb in house with rollator, has manual w/c for community distances ADLs Comments: Reports independent with bathing, and dressing.        OT Problem List: Decreased strength;Decreased activity tolerance      OT Treatment/Interventions: Self-care/ADL training;Energy conservation;Therapeutic activities    OT Goals(Current goals can be found in the care plan section) Acute Rehab OT Goals Patient Stated Goal: improve endurance OT Goal Formulation: With patient Time For Goal Achievement: 08/21/23 Potential to Achieve Goals: Fair ADL Goals Pt Will Perform Upper Body Bathing: with supervision;sitting Pt Will Perform Upper Body Dressing: with supervision;sitting Pt Will  Transfer to Toilet: with supervision;bedside commode;ambulating Pt Will Perform Toileting - Clothing Manipulation and hygiene: with supervision;sit to/from stand;sitting/lateral leans Additional ADL Goal #1: Pt will verbalize/demo implementation of PLB technique during all movement/ADL performance to prevent overexertion.  OT Frequency: Min 1X/week    Co-evaluation PT/OT/SLP Co-Evaluation/Treatment: Yes Reason for Co-Treatment: Complexity of the patient's impairments (multi-system involvement) (limited endurance from PE) PT goals addressed during session: Mobility/safety with mobility OT goals addressed during session: ADL's and self-care      AM-PAC OT "6 Clicks" Daily Activity     Outcome Measure Help from another person eating meals?: None Help from another person taking care of personal grooming?: None Help from another person toileting, which includes using toliet, bedpan, or urinal?: A Lot Help from another person bathing (including washing, rinsing, drying)?: A Lot Help from another person to put on and taking off regular upper body clothing?: A Little Help from another person to put on and taking off regular lower body clothing?: A Lot 6 Click Score: 17   End of Session Equipment Utilized During Treatment: Rolling walker (2 wheels) Nurse Communication: Mobility status  Activity  Tolerance: Patient tolerated treatment well;Patient limited by fatigue Patient left: in bed;with call bell/phone within reach;with bed alarm set;with family/visitor present  OT Visit Diagnosis: Other abnormalities of gait and mobility (R26.89)                Time: 0981-1914 OT Time Calculation (min): 23 min Charges:  OT General Charges $OT Visit: 1 Visit OT Evaluation $OT Eval Low Complexity: 1 Low Ashika Apuzzo, OTR/L 08/07/23, 1:50 PM  Tysheena Ginzburg E Jasmond River 08/07/2023, 1:48 PM

## 2023-08-07 NOTE — Evaluation (Signed)
Physical Therapy Evaluation Patient Details Name: Courtney Mack MRN: [id number] DOB: [birth year] Today's Date: 08/07/2023  History of Present Illness  Patient is a 68 year old female with acute pulmonary embolism. 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 Recurrent UTI. History of GAVE syndrome, recurrent VTE s/p IVC filter, CHF, COPD, left-sided hydronephrosis s/p nephrostomy tube placement, DM, HTN, and HLD.  Clinical Impression  Patient is agreeable to PT.  Patient reports she lives with family. She is able to stand and walk a short distance at baseline using her 4 wheeled walker.  Today the patient has mild dyspnea with exertion with Sp02 98-100% on room air. Patient required Min A for bed mobility, but she typically sleeps in a recliner chair at home. CGA provided for standing with bed using rolling walker. Standing tolerance is limited by fatigue and mild dyspnea. Anticipate patient can return home with family support. PT will continue to follow to maximize independence and decrease caregiver burden.       If plan is discharge home, recommend the following: Assist for transportation;Help with stairs or ramp for entrance;Assistance with cooking/housework   Can travel by private vehicle        Equipment Recommendations None recommended by PT  Recommendations for Other Services       Functional Status Assessment Patient has had a recent decline in their functional status and demonstrates the ability to make significant improvements in function in a reasonable and predictable amount of time.     Precautions / Restrictions Precautions Precautions: Fall Precaution Comments: LLQ nephrostomy tube Restrictions Weight Bearing Restrictions Per Provider Order: No      Mobility  Bed Mobility Overal bed mobility: Needs Assistance Bed Mobility: Supine to Sit, Sit to Supine     Supine to sit: Min assist Sit to supine: Min assist   General bed  mobility comments: assistance for trunk to sit upright and assistance for LE to return to bed    Transfers Overall transfer level: Needs assistance Equipment used: Rolling walker (2 wheels) Transfers: Sit to/from Stand Sit to Stand: Contact guard assist, From elevated surface           General transfer comment: CGA for safety. mild dyspnea with talking and with exertion. Sp02 remained 98-100% on room air. heart rate up slightly to 109bpm    Ambulation/Gait             Pre-gait activities: patient able to take a small side step along edge of bed with rolling walker with CGA for safety. further ambulation deferred due to mild dyspnea with exertion    Stairs            Wheelchair Mobility     Tilt Bed    Modified Rankin (Stroke Patients Only)       Balance Overall balance assessment: Needs assistance Sitting-balance support: Feet supported Sitting balance-Leahy Scale: Fair     Standing balance support: Bilateral upper extremity supported, Reliant on assistive device for balance Standing balance-Leahy Scale: Fair                               Pertinent Vitals/Pain Pain Assessment Pain Assessment: No/denies pain    Home Living Family/patient expects to be discharged to:: Private residence Living Arrangements: Children Available Help at Discharge: Family;Available 24 hours/day Type of Home: Apartment Home Access: Stairs to enter Entrance Stairs-Rails: None Entrance Stairs-Number of Steps: 1   Home  Layout: One level Home Equipment: Rollator (4 wheels);Cane - single point;BSC/3in1;Shower seat;Wheelchair - manual Additional Comments: sponge bathes at baseline. sleeps in a recliner at home    Prior Function Prior Level of Function : Needs assist       Physical Assist : ADLs (physical)   ADLs (physical): IADLs;Dressing;Bathing;Grooming Mobility Comments: amb in house with rollator, has manual w/c for community distances ADLs Comments:  Reports independent with bathing, and dressing.     Extremity/Trunk Assessment   Upper Extremity Assessment Upper Extremity Assessment: Defer to OT evaluation    Lower Extremity Assessment Lower Extremity Assessment: Generalized weakness       Communication   Communication Communication: No apparent difficulties  Cognition Arousal: Alert Behavior During Therapy: WFL for tasks assessed/performed Overall Cognitive Status: Within Functional Limits for tasks assessed                                          General Comments General comments (skin integrity, edema, etc.): cues for energy conservation strategies and breathing techniques    Exercises     Assessment/Plan    PT Assessment Patient needs continued PT services  PT Problem List Decreased range of motion;Decreased strength;Decreased activity tolerance;Decreased balance;Decreased mobility;Decreased safety awareness       PT Treatment Interventions DME instruction;Stair training;Gait training;Functional mobility training;Therapeutic activities;Therapeutic exercise;Balance training;Neuromuscular re-education;Cognitive remediation;Patient/family education    PT Goals (Current goals can be found in the Care Plan section)  Acute Rehab PT Goals Patient Stated Goal: to go home PT Goal Formulation: With patient Time For Goal Achievement: 08/21/23 Potential to Achieve Goals: Good    Frequency Min 1X/week     Co-evaluation PT/OT/SLP Co-Evaluation/Treatment: Yes Reason for Co-Treatment: Complexity of the patient's impairments (multi-system involvement) (limited endurance) PT goals addressed during session: Mobility/safety with mobility         AM-PAC PT "6 Clicks" Mobility  Outcome Measure Help needed turning from your back to your side while in a flat bed without using bedrails?: None Help needed moving from lying on your back to sitting on the side of a flat bed without using bedrails?: A  Little Help needed moving to and from a bed to a chair (including a wheelchair)?: A Little Help needed standing up from a chair using your arms (e.g., wheelchair or bedside chair)?: A Little Help needed to walk in hospital room?: A Little Help needed climbing 3-5 steps with a railing? : A Lot 6 Click Score: 18    End of Session   Activity Tolerance: Patient limited by fatigue Patient left: in bed;with call bell/phone within reach;with bed alarm set Nurse Communication: Mobility status PT Visit Diagnosis: Muscle weakness (generalized) (M62.81);Unsteadiness on feet (R26.81)    Time: 4403-4742 PT Time Calculation (min) (ACUTE ONLY): 20 min   Charges:   PT Evaluation $PT Eval Low Complexity: 1 Low   PT General Charges $$ ACUTE PT VISIT: 1 Visit         Donna Bernard, PT, MPT   Ina [employer] 08/07/2023, 12:40 PM

## 2023-08-07 NOTE — TOC Initial Note (Signed)
Transition of Care [facility]) - Initial/Assessment Note    Patient Details  Name: Courtney Mack MRN: [id number] Date of Birth: 1956/05/30  Transition of Care [facility]) CM/SW Contact:    Margarito Liner, LCSW Phone Number: 08/07/2023, 10:28 AM  Clinical Narrative:   TOC completed readmission prevention screen on 1/16:  "CSW met with pt at bedside to complete High Risk Readmission Assessment.    Admitted for: Kidney and UTI Admitted from: Home  PCP: [facility]  Pharmacy: [facility] in [place]  Current home health/prior home health/DME:[facility]    No current TOC needs. If any TOC needs arise, please place TOC consult."               Expected Discharge Plan: Home/Self Care Barriers to Discharge: Continued Medical Work up   Patient Goals and CMS Choice            Expected Discharge Plan and Services     Post Acute Care Choice: NA Living arrangements for the past 2 months: Apartment                                      Prior Living Arrangements/Services Living arrangements for the past 2 months: Apartment   Patient language and need for interpreter reviewed:: Yes        Need for Family Participation in Patient Care: Yes (Comment)   Current home services: DME Criminal Activity/Legal Involvement Pertinent to Current Situation/Hospitalization: No - Comment as needed  Activities of Daily Living   ADL Screening (condition at time of admission) Independently performs ADLs?: Yes (appropriate for developmental age) Is the patient deaf or have difficulty hearing?: No Does the patient have difficulty seeing, even when wearing glasses/contacts?: No Does the patient have difficulty concentrating, remembering, or making decisions?: No  Permission Sought/Granted                  Emotional Assessment       Orientation: : Oriented to Self, Oriented to Place, Oriented to  Time, Oriented to Situation Alcohol / Substance Use: Not Applicable Psych Involvement: No  (comment)  Admission diagnosis:  Acute pulmonary embolism (HCC) [I26.99] Other pulmonary embolism without acute cor pulmonale, unspecified chronicity (HCC) [I26.99] Acute pulmonary embolism without acute cor pulmonale (HCC) [I26.99] Patient Active Problem List   Diagnosis Date Noted   Acute pulmonary embolism without acute cor pulmonale (HCC) 08/07/2023   Chronic deep vein thrombosis (DVT) of proximal vein of right lower extremity (HCC) 08/05/2023   Acute pulmonary embolism (HCC) 08/04/2023   Dysuria 06/22/2023   SOB (shortness of breath) 06/18/2023   HTN (hypertension) 06/18/2023   Complicated UTI (urinary tract infection) 05/26/2023   Diarrhea 05/26/2023   Blocked nephrostomy tube with hydronephrosis and hydroureter (HCC) 04/05/2023   Hypertension    Pressure injury of skin 02/11/2023   Severe sepsis with acute organ dysfunction (HCC) 02/10/2023   Obesity (BMI 30-39.9) 11/22/2022   Sepsis due to gram-negative UTI (HCC) 11/21/2022   Acute pyelonephritis 11/21/2022   Dyspnea 11/21/2022   Chest pain 11/21/2022   Hypokalemia 10/25/2022   Chronic obstructive pulmonary disease (COPD) (HCC) 10/24/2022   Iron deficiency anemia 10/24/2022   Symptomatic anemia 08/08/2022   COPD (chronic obstructive pulmonary disease) (HCC) 08/07/2022   Sepsis due to pneumonia (HCC) 06/12/2022   Multifocal pneumonia 06/12/2022   GERD without esophagitis 06/12/2022   Generalized weakness 06/12/2022   Nephrostomy status (HCC) 06/12/2022  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) 04/16/2022   GI bleed 03/14/2022   Acute on chronic anemia 02/24/2022   Hypotension 02/24/2022   Acute cystitis with hematuria    Lymphedema 02/08/2022   Hematuria 12/24/2021   History of pulmonary embolus (PE) 12/24/2021   Chronic radiation cystitis 11/17/2021   Hydronephrosis of left kidney    Gastric antral vascular ectasia    Complicated urinary tract infection 09/13/2021   AKI (acute kidney  injury) (HCC) 09/13/2021   Melena 07/18/2021   Acute on chronic blood loss anemia 07/18/2021   HLD (hyperlipidemia) 06/20/2021   Chronic diastolic CHF (congestive heart failure) (HCC) 06/20/2021   Risk factors for obstructive sleep apnea 11/10/2020   IDA (iron deficiency anemia) 09/21/2020   Endometrial cancer (HCC) 09/13/2020   Recurrent deep vein thrombosis (DVT) (HCC) 09/06/2020   Obesity, Class III, BMI 40-49.9 (morbid obesity) (HCC) 08/23/2020   Dyslipidemia 08/23/2020   PCP:  [facility]:   [id number] [place], [place] - [address] AT [facility] OF [address] [place] [address] [place] [address] Phone: [phone] Fax: [phone]     Social Drivers of Health (SDOH) Social History: SDOH Screenings   Food Insecurity: No Food Insecurity (08/05/2023)  Housing: Low Risk  (08/05/2023)  Transportation Needs: No Transportation Needs (08/05/2023)  Utilities: Not At Risk (08/05/2023)  Depression (PHQ2-9): Low Risk  (04/11/2022)  Social Connections: Unknown (08/05/2023)  Tobacco Use: Low Risk  (08/05/2023)   SDOH Interventions:     Readmission Risk Interventions    08/07/2023   10:26 AM 07/24/2023   11:14 AM 05/29/2023    9:42 AM  Readmission Risk Prevention Plan  Transportation Screening  Complete   Medication Review (RN Care Manager) Complete Complete   PCP or Specialist appointment within 3-5 days of discharge Complete Complete   HRI or Home Care Consult   Patient refused  SW Recovery Care/Counseling Consult Complete Complete Complete  Palliative Care Screening Not Applicable Not Applicable Not Applicable  Skilled Nursing Facility Not Applicable Not Applicable Not Applicable

## 2023-08-08 DIAGNOSIS — Z515 Encounter for palliative care: Secondary | ICD-10-CM | POA: Diagnosis not present

## 2023-08-08 DIAGNOSIS — I825Y1 Chronic embolism and thrombosis of unspecified deep veins of right proximal lower extremity: Secondary | ICD-10-CM | POA: Diagnosis not present

## 2023-08-08 DIAGNOSIS — D500 Iron deficiency anemia secondary to blood loss (chronic): Secondary | ICD-10-CM | POA: Diagnosis not present

## 2023-08-08 DIAGNOSIS — I2699 Other pulmonary embolism without acute cor pulmonale: Secondary | ICD-10-CM | POA: Diagnosis not present

## 2023-08-08 LAB — CBC WITH DIFFERENTIAL/PLATELET
Abs Immature Granulocytes: 0.01 10*3/uL (ref 0.00–0.07)
Basophils Absolute: 0 10*3/uL (ref 0.0–0.1)
Basophils Relative: 1 %
Eosinophils Absolute: 0.1 10*3/uL (ref 0.0–0.5)
Eosinophils Relative: 3 %
HCT: 28.9 % — ABNORMAL LOW (ref 36.0–46.0)
Hemoglobin: 9 g/dL — ABNORMAL LOW (ref 12.0–15.0)
Immature Granulocytes: 0 %
Lymphocytes Relative: 18 %
Lymphs Abs: 0.8 10*3/uL (ref 0.7–4.0)
MCH: 28.1 pg (ref 26.0–34.0)
MCHC: 31.1 g/dL (ref 30.0–36.0)
MCV: 90.3 fL (ref 80.0–100.0)
Monocytes Absolute: 0.3 10*3/uL (ref 0.1–1.0)
Monocytes Relative: 8 %
Neutro Abs: 3.1 10*3/uL (ref 1.7–7.7)
Neutrophils Relative %: 70 %
Platelets: 254 10*3/uL (ref 150–400)
RBC: 3.2 MIL/uL — ABNORMAL LOW (ref 3.87–5.11)
RDW: 15.7 % — ABNORMAL HIGH (ref 11.5–15.5)
WBC: 4.3 10*3/uL (ref 4.0–10.5)
nRBC: 0 % (ref 0.0–0.2)

## 2023-08-08 LAB — GLUCOSE, CAPILLARY
Glucose-Capillary: 103 mg/dL — ABNORMAL HIGH (ref 70–99)
Glucose-Capillary: 119 mg/dL — ABNORMAL HIGH (ref 70–99)
Glucose-Capillary: 131 mg/dL — ABNORMAL HIGH (ref 70–99)
Glucose-Capillary: 112 mg/dL — ABNORMAL HIGH (ref 70–99)

## 2023-08-08 LAB — BASIC METABOLIC PANEL
Anion gap: 8 (ref 5–15)
BUN: 28 mg/dL — ABNORMAL HIGH (ref 8–23)
CO2: 25 mmol/L (ref 22–32)
Calcium: 8.3 mg/dL — ABNORMAL LOW (ref 8.9–10.3)
Chloride: 108 mmol/L (ref 98–111)
Creatinine, Ser: 0.99 mg/dL (ref 0.44–1.00)
GFR, Estimated: >60 mL/min (ref >60)
Glucose, Bld: 106 mg/dL — ABNORMAL HIGH (ref 70–99)
Potassium: 3.8 mmol/L (ref 3.5–5.1)
Sodium: 141 mmol/L (ref 135–145)

## 2023-08-08 MED ORDER — IRON SUCROSE 200 MG IVPB - SIMPLE MED
200.0000 mg | Freq: Once | Status: AC
  Administered 2023-08-08: 200 mg via INTRAVENOUS
  Filled 2023-08-08: qty 200

## 2023-08-08 NOTE — Consult Note (Addendum)
Hematology/Oncology Consult note [phone] [phone]      Patient Care Team: Center, [facility] as PCP - General (General Practice) Antonieta Iba, MD as PCP - Cardiology (Cardiology) [name], RN as Oncology Nurse Navigator Rickard Patience, MD as Consulting Physician (Hematology and Oncology)   Name of the patient: Courtney Mack  [id number]  [birth year]   REASON FOR COSULTATION:  Anticoagulation recommendation.  History of presenting illness-  68 y.o. female with multiple medical problems list. at below who is currently admitted due to SOB.  Work up showed acute subsegmental PE. Chronic lower extremity DVT. Vascular surgery consulted; patient is not a candidate for any vascular procedure and replacing IVC filter would not likely help but would significantly increase her risk of a large PE peri-procedure  She was started on heparin gtt which needs to be discontinued due to rapid drop of hemoglobin.She has a history of gastric antral vascular ectasia.  Patient has a history of recurrent DVT. She did not tolerate therapeutic anticoagulation, and even not able to tolerate low dose Xalreto. S/p IVC filter placement.  Hematology was consulted for recommendation of anticoagulation.  Today patient reports feeling ok. SOB has improved. She has received blood transfusion after hemoglobin drops to 7, today Hb is 9 She denies dark stool or blood in stool.      No Known Allergies  Patient Active Problem List   Diagnosis Date Noted   IDA (iron deficiency anemia) 09/21/2020    Priority: High   Endometrial cancer (HCC) 09/13/2020    Priority: Medium    Recurrent deep vein thrombosis (DVT) (HCC) 09/06/2020    Priority: Medium    Acute pulmonary embolism without acute cor pulmonale (HCC) 08/07/2023   Chronic deep vein thrombosis (DVT) of proximal vein of right lower extremity (HCC) 08/05/2023   Acute pulmonary embolism (HCC) 08/04/2023    Dysuria 06/22/2023   SOB (shortness of breath) 06/18/2023   HTN (hypertension) 06/18/2023   Complicated UTI (urinary tract infection) 05/26/2023   Diarrhea 05/26/2023   Blocked nephrostomy tube with hydronephrosis and hydroureter (HCC) 04/05/2023   Hypertension    Pressure injury of skin 02/11/2023   Severe sepsis with acute organ dysfunction (HCC) 02/10/2023   Obesity (BMI 30-39.9) 11/22/2022   Sepsis due to gram-negative UTI (HCC) 11/21/2022   Acute pyelonephritis 11/21/2022   Dyspnea 11/21/2022   Chest pain 11/21/2022   Hypokalemia 10/25/2022   Chronic obstructive pulmonary disease (COPD) (HCC) 10/24/2022   Iron deficiency anemia 10/24/2022   Symptomatic anemia 08/08/2022   COPD (chronic obstructive pulmonary disease) (HCC) 08/07/2022   Sepsis due to pneumonia (HCC) 06/12/2022   Multifocal pneumonia 06/12/2022   GERD without esophagitis 06/12/2022   Generalized weakness 06/12/2022   Nephrostomy status (HCC) 06/12/2022   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) 04/16/2022   GI bleed 03/14/2022   Acute on chronic anemia 02/24/2022   Hypotension 02/24/2022   Acute cystitis with hematuria    Lymphedema 02/08/2022   Hematuria 12/24/2021   History of pulmonary embolus (PE) 12/24/2021   Chronic radiation cystitis 11/17/2021   Hydronephrosis of left kidney    Gastric antral vascular ectasia    Complicated urinary tract infection 09/13/2021   AKI (acute kidney injury) (HCC) 09/13/2021   Melena 07/18/2021   Acute on chronic blood loss anemia 07/18/2021   HLD (hyperlipidemia) 06/20/2021   Chronic diastolic CHF (congestive heart failure) (HCC) 06/20/2021   Risk factors for obstructive sleep apnea 11/10/2020   Obesity,  Class III, BMI 40-49.9 (morbid obesity) (HCC) 08/23/2020   Dyslipidemia 08/23/2020     Past Medical History:  Diagnosis Date   Acute bilateral deep vein thrombosis (DVT) of femoral veins (HCC) 01/02/2021   Acute massive pulmonary embolism  (HCC) 08/25/2020   Last Assessment & Plan:  Formatting of this note might be different from the original. Post vena caval filter and on xarelto and O2   Anemia    ARF (acute respiratory failure) (HCC)    Cellulitis of left lower leg 06/20/2021   CHF (congestive heart failure) (HCC)    COVID-19    Diabetes mellitus without complication (HCC)    DVT (deep venous thrombosis) (HCC) 09/06/2020   Endometrial cancer (HCC)    GAVE (gastric antral vascular ectasia)    GERD (gastroesophageal reflux disease)    High cholesterol    Hx of blood clots    Hyperlipidemia    Hypertension    IDA (iron deficiency anemia) 09/21/2020   Obesity    Pneumonia 06/11/2022   recovered   Pressure injury of skin 06/21/2021   Pulmonary embolism (HCC)    Sepsis (HCC)    Thrombocytopenia (HCC)    Urinary tract infection 09/13/2021     Past Surgical History:  Procedure Laterality Date   CATARACT EXTRACTION W/PHACO Right 06/04/2022   Procedure: CATARACT EXTRACTION PHACO AND INTRAOCULAR LENS PLACEMENT (IOC) COMPLICATED RIGHT  31.83  02:17.4;  Surgeon: Galen Manila, MD;  Location: [facility];  Service: Ophthalmology;  Laterality: Right;   CATARACT EXTRACTION W/PHACO Left 07/09/2022   Procedure: CATARACT EXTRACTION PHACO AND INTRAOCULAR LENS PLACEMENT (IOC);  Surgeon: Galen Manila, MD;  Location: [facility];  Service: Ophthalmology;  Laterality: Left;  22.75 1:40.3   COLONOSCOPY N/A 06/21/2021   Procedure: COLONOSCOPY;  Surgeon: Toledo, Boykin Nearing, MD;  Location: [facility] END[facility];  Service: Gastroenterology;  Laterality: N/A;   CYSTOSCOPY W/ URETERAL STENT PLACEMENT Left 09/11/2021   Procedure: CYSTOSCOPY WITH RETROGRADE PYELOGRAM/URETERAL STENT PLACEMENT;  Surgeon: Riki Altes, MD;  Location: [facility] ORS;  Service: Urology;  Laterality: Left;   CYSTOSCOPY W/ URETERAL STENT PLACEMENT Left 11/17/2021   Procedure: CYSTOSCOPY WITH STENT REPLACEMENT;  Surgeon: Bjorn Pippin, MD;  Location: [facility]  ORS;  Service: Urology;  Laterality: Left;   CYSTOSCOPY WITH FULGERATION  11/17/2021   Procedure: CYSTOSCOPY WITH FULGERATION;  Surgeon: Bjorn Pippin, MD;  Location: [facility];  Service: Urology;;   CYSTOSCOPY WITH STENT PLACEMENT Left 10/17/2021   Procedure: CYSTOSCOPY WITH STENT PLACEMENT;  Surgeon: Riki Altes, MD;  Location: [facility];  Service: Urology;  Laterality: Left;   CYSTOSCOPY WITH URETEROSCOPY, STONE BASKETRY AND STENT PLACEMENT Left 10/16/2021   Procedure: CYSTOSCOPY WITH URETEROSCOPY  AND STENT REMOVAL;  Surgeon: Riki Altes, MD;  Location: [facility];  Service: Urology;  Laterality: Left;   ENTEROSCOPY N/A 08/17/2021   Procedure: ENTEROSCOPY;  Surgeon: Toney Reil, MD;  Location: [facility];  Service: Gastroenterology;  Laterality: N/A;   ESOPHAGOGASTRODUODENOSCOPY N/A 06/21/2021   Procedure: ESOPHAGOGASTRODUODENOSCOPY (EGD);  Surgeon: Toledo, Boykin Nearing, MD;  Location: [facility];  Service: Gastroenterology;  Laterality: N/A;   ESOPHAGOGASTRODUODENOSCOPY (EGD) WITH PROPOFOL N/A 08/17/2021   Procedure: ESOPHAGOGASTRODUODENOSCOPY (EGD) WITH PROPOFOL;  Surgeon: Toney Reil, MD;  Location: [facility] ENDOSCOPY;  Service: Gastroenterology;  Laterality: N/A;   ESOPHAGOGASTRODUODENOSCOPY (EGD) WITH PROPOFOL N/A 10/14/2021   Procedure: ESOPHAGOGASTRODUODENOSCOPY (EGD) WITH PROPOFOL;  Surgeon: Midge Minium, MD;  Location: [facility] END[facility];  Service: Endoscopy;  Laterality: N/A;   ESOPHAGOGASTRODUODENOSCOPY (EGD) WITH PROPOFOL N/A 02/01/2022  Procedure: ESOPHAGOGASTRODUODENOSCOPY (EGD) WITH PROPOFOL;  Surgeon: Toney Reil, MD;  Location: [facility] ENDOSCOPY;  Service: Gastroenterology;  Laterality: N/A;   ESOPHAGOGASTRODUODENOSCOPY (EGD) WITH PROPOFOL N/A 03/15/2022   Procedure: ESOPHAGOGASTRODUODENOSCOPY (EGD) WITH PROPOFOL;  Surgeon: Wyline Mood, MD;  Location: [facility]COPY;  Service: Gastroenterology;  Laterality: N/A;   ESOPHAGOGASTRODUODENOSCOPY (EGD) WITH PROPOFOL N/A  04/18/2022   Procedure: ESOPHAGOGASTRODUODENOSCOPY (EGD) WITH PROPOFOL;  Surgeon: Toney Reil, MD;  Location: [facility] ENDOSCOPY;  Service: Gastroenterology;  Laterality: N/A;   ESOPHAGOGASTRODUODENOSCOPY (EGD) WITH PROPOFOL N/A 08/09/2022   Procedure: ESOPHAGOGASTRODUODENOSCOPY (EGD) WITH PROPOFOL;  Surgeon: Wyline Mood, MD;  Location: [facility] ENDOSCOPY;  Service: Gastroenterology;  Laterality: N/A;   GIVENS CAPSULE STUDY N/A 06/22/2021   Procedure: GIVENS CAPSULE STUDY;  Surgeon: Toledo, Boykin Nearing, MD;  Location: [facility];  Service: Gastroenterology;  Laterality: N/A;   IR NEPHROSTOMY EXCHANGE LEFT  02/25/2022   IR NEPHROSTOMY EXCHANGE LEFT  03/27/2022   IR NEPHROSTOMY EXCHANGE LEFT  05/22/2022   IR NEPHROSTOMY EXCHANGE LEFT  07/24/2022   IR NEPHROSTOMY EXCHANGE LEFT  09/24/2022   IR NEPHROSTOMY EXCHANGE LEFT  11/21/2022   IR NEPHROSTOMY EXCHANGE LEFT  02/17/2023   IR NEPHROSTOMY EXCHANGE LEFT  04/06/2023   IR NEPHROSTOMY EXCHANGE LEFT  05/27/2023   IR NEPHROSTOMY EXCHANGE LEFT  07/28/2023   IR NEPHROSTOMY PLACEMENT LEFT  12/27/2021   IVC FILTER INSERTION N/A 08/18/2020   Procedure: IVC FILTER INSERTION;  Surgeon: Renford Dills, MD;  Location: [facility];  Service: Cardiovascular;  Laterality: N/A;   PERIPHERAL VASCULAR THROMBECTOMY Bilateral 01/03/2021   Procedure: PERIPHERAL VASCULAR THROMBECTOMY;  Surgeon: Annice Needy, MD;  Location: [facility];  Service: Cardiovascular;  Laterality: Bilateral;   PULMONARY THROMBECTOMY N/A 08/18/2020   Procedure: PULMONARY THROMBECTOMY;  Surgeon: Renford Dills, MD;  Location: [facility];  Service: Cardiovascular;  Laterality: N/A;   TUBAL LIGATION     VISCERAL ANGIOGRAPHY N/A 07/18/2021   Procedure: VISCERAL ANGIOGRAPHY;  Surgeon: Annice Needy, MD;  Location: [facility];  Service: Cardiovascular;  Laterality: N/A;    Social History   Socioeconomic History   Marital status: Single    Spouse name: Not on  file   Number of children: Not on file   Years of education: Not on file   Highest education level: Not on file  Occupational History   Not on file  Tobacco Use   Smoking status: Never   Smokeless tobacco: Never   Tobacco comments:    smoked 2-4 cigarettes for about 2-3 weeks   Vaping Use   Vaping status: Never Used  Substance and Sexual Activity   Alcohol use: Not Currently   Drug use: Never   Sexual activity: Not Currently  Other Topics Concern   Not on file  Social History Narrative   Not on file   Social Drivers of Health   Financial Resource Strain: Not on file  Food Insecurity: No Food Insecurity (08/05/2023)   Hunger Vital Sign    Worried About Running Out of Food in the Last Year: Never true    Ran Out of Food in the Last Year: Never true  Transportation Needs: No Transportation Needs (08/05/2023)   PRAPARE - Administrator, Civil Service (Medical): No    Lack of Transportation (Non-Medical): No  Physical Activity: Not on file  Stress: Not on file  Social Connections: Unknown (08/05/2023)   Social Connection and Isolation Panel [NHANES]    Frequency of Communication  with Friends and Family: More than three times a week    Frequency of Social Gatherings with Friends and Family: Three times a week    Attends Religious Services: Never    Active Member of Clubs or Organizations: No    Attends Banker Meetings: Never    Marital Status: Patient declined  Intimate Partner Violence: Not At Risk (08/05/2023)   Humiliation, Afraid, Rape, and Kick questionnaire    Fear of Current or Ex-Partner: No    Emotionally Abused: No    Physically Abused: No    Sexually Abused: No     Family History  Problem Relation Age of Onset   Hypertension Mother    Diabetes Mother    Diabetes Father    Hypertension Father    High Cholesterol Father    Congestive Heart Failure Father    Breast cancer Cousin      Current Facility-Administered Medications:     acetaminophen (TYLENOL) tablet 650 mg, 650 mg, Oral, Q6H PRN, 650 mg at 08/05/23 1552 **OR** acetaminophen (TYLENOL) suppository 650 mg, 650 mg, Rectal, Q6H PRN, Verdene Lennert, MD   ascorbic acid (VITAMIN C) tablet 1,000 mg, 1,000 mg, Oral, Daily, Enedina Finner, MD, 1,000 mg at 08/07/23 0901   enoxaparin (LOVENOX) injection 30 mg, 30 mg, Subcutaneous, Q12H, Jonah Blue, MD, 30 mg at 08/07/23 2000   ferrous sulfate tablet 325 mg, 325 mg, Oral, Q breakfast, Verdene Lennert, MD, 325 mg at 08/07/23 0901   furosemide (LASIX) tablet 20 mg, 20 mg, Oral, Daily, Verdene Lennert, MD, 20 mg at 08/07/23 0901   insulin aspart (novoLOG) injection 0-15 Units, 0-15 Units, Subcutaneous, TID WC, Jonah Blue, MD, 2 Units at 08/07/23 1225   ipratropium-albuterol (DUONEB) 0.5-2.5 (3) MG/3ML nebulizer solution 3 mL, 3 mL, Nebulization, Q4H PRN, Verdene Lennert, MD   mometasone-formoterol (DULERA) 100-5 MCG/ACT inhaler 2 puff, 2 puff, Inhalation, BID, Verdene Lennert, MD, 2 puff at 08/07/23 2244   multivitamin with minerals tablet 1 tablet, 1 tablet, Oral, Daily, Enedina Finner, MD, 1 tablet at 08/07/23 0901   ondansetron (ZOFRAN) tablet 4 mg, 4 mg, Oral, Q6H PRN **OR** ondansetron (ZOFRAN) injection 4 mg, 4 mg, Intravenous, Q6H PRN, Verdene Lennert, MD   pantoprazole (PROTONIX) EC tablet 40 mg, 40 mg, Oral, BID, Enedina Finner, MD, 40 mg at 08/07/23 2112   polyethylene glycol (MIRALAX / GLYCOLAX) packet 17 g, 17 g, Oral, Daily PRN, Verdene Lennert, MD   rosuvastatin (CRESTOR) tablet 10 mg, 10 mg, Oral, Daily, Verdene Lennert, MD, 10 mg at 08/07/23 0901   sodium chloride flush (NS) 0.9 % injection 3 mL, 3 mL, Intravenous, Q12H, Verdene Lennert, MD, 3 mL at 08/07/23 2113   trimethoprim (TRIMPEX) tablet 100 mg, 100 mg, Oral, QHS, Jonah Blue, MD, 100 mg at 08/07/23 2235   vitamin B-12 (CYANOCOBALAMIN) tablet 100 mcg, 100 mcg, Oral, Daily, Verdene Lennert, MD, 100 mcg at 08/07/23 0901  Review of Systems   Constitutional:  Positive for fatigue. Negative for appetite change, chills and fever.  HENT:   Negative for hearing loss and voice change.   Eyes:  Negative for eye problems.  Respiratory:  Positive for shortness of breath. Negative for chest tightness and cough.   Cardiovascular:  Negative for chest pain.  Gastrointestinal:  Negative for abdominal distention, abdominal pain and blood in stool.  Endocrine: Negative for hot flashes.  Genitourinary:  Negative for difficulty urinating and frequency.   Musculoskeletal:  Negative for arthralgias.  Skin:  Negative for itching and rash.  Neurological:  Negative for extremity weakness.  Hematological:  Negative for adenopathy.  Psychiatric/Behavioral:  Negative for confusion.     PHYSICAL EXAM Vitals:   08/07/23 1540 08/07/23 1859 08/08/23 0139 08/08/23 0753  BP: (!) 109/57 (!) 108/49 (!) 111/50 (!) 112/52  Pulse: 83 83  73  Resp: (!) 24 16  18   Temp: 98.2 F (36.8 C) 99.3 F (37.4 C) 98.8 F (37.1 C) 98.4 F (36.9 C)  TempSrc: Oral Oral  Oral  SpO2: 98% 96% 98% 97%  Weight:      Height:       Physical Exam Constitutional:      General: She is not in acute distress.    Appearance: She is not diaphoretic.  HENT:     Head: Normocephalic and atraumatic.  Eyes:     General: No scleral icterus. Cardiovascular:     Rate and Rhythm: Normal rate and regular rhythm.  Pulmonary:     Effort: Pulmonary effort is normal. No respiratory distress.     Breath sounds: Normal breath sounds. No wheezing.  Abdominal:     General: Bowel sounds are normal. There is no distension.     Palpations: Abdomen is soft.  Musculoskeletal:        General: Normal range of motion.     Cervical back: Normal range of motion.  Skin:    General: Skin is warm and dry.     Findings: No erythema.  Neurological:     Mental Status: She is alert and oriented to person, place, and time. Mental status is at baseline.     Motor: No abnormal muscle tone.   Psychiatric:        Mood and Affect: Mood and affect normal.       LABORATORY STUDIES    Latest Ref Rng & Units 08/08/2023    5:25 AM 08/07/2023    6:37 AM 08/06/2023    5:29 AM  CBC  WBC 4.0 - 10.5 K/uL 4.3  4.2  4.8   Hemoglobin 12.0 - 15.0 g/dL 9.0  8.5  7.0   Hematocrit 36.0 - 46.0 % 28.9  27.0  22.6   Platelets 150 - 400 K/uL 254  258  272       Latest Ref Rng & Units 08/08/2023    5:25 AM 08/07/2023    6:37 AM 08/05/2023    6:38 AM  CMP  Glucose 70 - 99 mg/dL 213  086  578   BUN 8 - 23 mg/dL 28  29  22    Creatinine 0.44 - 1.00 mg/dL 4.69  6.29  5.28   Sodium 135 - 145 mmol/L 141  138  139   Potassium 3.5 - 5.1 mmol/L 3.8  3.8  3.8   Chloride 98 - 111 mmol/L 108  107  106   CO2 22 - 32 mmol/L 25  23  21    Calcium 8.9 - 10.3 mg/dL 8.3  8.5  8.8      RADIOGRAPHIC STUDIES: I have personally reviewed the radiological images as listed and agreed with the findings in the report. US Venous Img Lower Bilateral (DVT) Result Date: 08/05/2023 CLINICAL DATA:  Bilateral lower extremity pain and edema. History of cervical cancer and previous DVT. Evaluate for acute or chronic DVT. EXAM: BILATERAL LOWER EXTREMITY VENOUS DOPPLER ULTRASOUND TECHNIQUE: Gray-scale sonography with graded compression, as well as color Doppler and duplex ultrasound were performed to evaluate the lower extremity deep venous systems from the level of the common femoral vein  and including the common femoral, femoral, profunda femoral, popliteal and calf veins including the posterior tibial, peroneal and gastrocnemius veins when visible. The superficial great saphenous vein was also interrogated. Spectral Doppler was utilized to evaluate flow at rest and with distal augmentation maneuvers in the common femoral, femoral and popliteal veins. COMPARISON:  Bilateral lower extremity venous Doppler ultrasound-02/24/2022 FINDINGS: RIGHT LOWER EXTREMITY Common Femoral Vein: There is mixed echogenic nonocclusive wall  thickening/DVT involving right common femoral vein (image 31 and 32). Saphenofemoral Junction: No evidence of thrombus. Normal compressibility and flow on color Doppler imaging. Profunda Femoral Vein: No evidence of thrombus. Normal compressibility and flow on color Doppler imaging. Femoral Vein: The right femoral vein appears atretic with mixed echogenic predominantly occlusive wall thickening/chronic DVT involving the proximal (images 42 and 43 mid (images 45 and 46 and distal (images 48 and 49) aspects of the femoral vein, similar to the 02/2022 examination. Popliteal Vein: There is mixed echogenic nonocclusive wall thickening involving the right popliteal vein (images 51 and 52), similar to the 02/2022 examination. Calf Veins: No evidence of thrombus. Normal compressibility and flow on color Doppler imaging. Superficial Great Saphenous Vein: No evidence of thrombus. Normal compressibility. Other Findings:  None. LEFT LOWER EXTREMITY Common Femoral Vein: No evidence of thrombus. Normal compressibility, respiratory phasicity and response to augmentation. Saphenofemoral Junction: No evidence of thrombus. Normal compressibility and flow on color Doppler imaging. Profunda Femoral Vein: No evidence of thrombus. Normal compressibility and flow on color Doppler imaging. Femoral Vein: No evidence of thrombus. Normal compressibility, respiratory phasicity and response to augmentation. Popliteal Vein: No evidence of thrombus. Normal compressibility, respiratory phasicity and response to augmentation. Calf Veins: No evidence of thrombus. Normal compressibility and flow on color Doppler imaging. Superficial Great Saphenous Vein: No evidence of thrombus. Normal compressibility. Other Findings:  None. IMPRESSION: 1. Chronic mixed occlusive and nonocclusive DVT extending from the right common femoral vein through the right popliteal vein, similar to the 02/2022 examination. 2. No evidence of acute DVT within the right lower  extremity. 3. No evidence of acute or chronic DVT within the left lower extremity. Electronically Signed   By: Simonne Come M.D.   On: 08/05/2023 16:12   CT Angio Chest PE W and/or Wo Contrast Result Date: 08/04/2023 CLINICAL DATA:  High probability for PE.  Shortness of breath. EXAM: CT ANGIOGRAPHY CHEST WITH CONTRAST TECHNIQUE: Multidetector CT imaging of the chest was performed using the standard protocol during bolus administration of intravenous contrast. Multiplanar CT image reconstructions and MIPs were obtained to evaluate the vascular anatomy. RADIATION DOSE REDUCTION: This exam was performed according to the departmental dose-optimization program which includes automated exposure control, adjustment of the mA and/or kV according to patient size and/or use of iterative reconstruction technique. CONTRAST:  75mL OMNIPAQUE IOHEXOL 350 MG/ML SOLN COMPARISON:  CT angiogram chest 07/24/2023. FINDINGS: Cardiovascular: Small segmental right lower lobe pulmonary emboli are seen. Heart is mildly enlarged. Aorta is normal in size. Mediastinum/Nodes: No enlarged mediastinal, hilar, or axillary lymph nodes. Thyroid gland, trachea, and esophagus demonstrate no significant findings. Lungs/Pleura: There are areas of air trapping throughout both lungs similar to the prior study. There is atelectasis in the bilateral lower lobes and lingula. There is no pleural effusion or pneumothorax. Trachea and central airways are patent. Upper Abdomen: No acute abnormality. Musculoskeletal: No chest wall abnormality. No acute or significant osseous findings. Review of the MIP images confirms the above findings. IMPRESSION: 1. Small segmental right lower lobe pulmonary emboli. No right heart strain. 2.  Bilateral lower lobe and lingular atelectasis. 3. Stable areas of air trapping throughout both lungs. 4. Mild cardiomegaly. Electronically Signed   By: Darliss Cheney M.D.   On: 08/04/2023 21:29   DG Chest 2 View Result Date:  08/04/2023 CLINICAL DATA:  Shortness of breath. EXAM: CHEST - 2 VIEW COMPARISON:  None Available. FINDINGS: Low lung volume. Bilateral lung fields are clear. Bilateral costophrenic angles are clear. Elevated right hemidiaphragm noted. Normal cardio-mediastinal silhouette. No acute osseous abnormalities. The soft tissues are within normal limits. Partially seen presumed IVC filter. IMPRESSION: No active cardiopulmonary disease. Electronically Signed   By: Jules Schick M.D.   On: 08/04/2023 16:30   DG Chest Port 1 View Result Date: 07/28/2023 CLINICAL DATA:  Dyspnea, chest pain. History of congestive heart failure and diabetes. EXAM: PORTABLE CHEST 1 VIEW COMPARISON:  Radiographs 07/23/2023 and 06/18/2023.  CT 07/24/2023. FINDINGS: 1453 hours. Persistent low lung volumes. The heart size and mediastinal contours are stable with aortic atherosclerosis. Unchanged patchy ground-glass opacities in both lungs which may be postinflammatory. No evidence of confluent airspace disease, edema, pleural effusion or pneumothorax. No acute osseous findings are seen. IMPRESSION: Unchanged patchy ground-glass opacities in both lungs which may be postinflammatory. No evidence of acute cardiopulmonary process. Electronically Signed   By: Carey Bullocks M.D.   On: 07/28/2023 15:52   IR NEPHROSTOMY EXCHANGE LEFT Result Date: 07/28/2023 INDICATION: 68 year old female with chronic indwelling left percutaneous nephrostomy tube. She is currently admitted to hospital and it is time for her next nephrostomy tube exchange. Therefore, she presents for nephrostomy tube exchange. EXAM: Nephrostomy tube exchange COMPARISON:  None Available. MEDICATIONS: None. ANESTHESIA/SEDATION: None. CONTRAST:  5 mL Omnipaque 300 - administered into the collecting system(s) FLUOROSCOPY: Radiation Exposure Index (as provided by the fluoroscopic device): 9 mGy Kerma COMPLICATIONS: None PROCEDURE: Informed written consent was obtained from the patient after  a thorough discussion of the procedural risks, benefits and alternatives. All questions were addressed. Maximal Sterile Barrier Technique was utilized including caps, mask, sterile gowns, sterile gloves, sterile drape, hand hygiene and skin antiseptic. A timeout was performed prior to the initiation of the procedure. A gentle hand injection of contrast material through the existing tube opacifies the renal pelvis. The tube was transected and removed over a wire. A new 10 [place] all-purpose drainage catheter was advanced over the wire and formed in the renal pelvis. Contrast was injected confirming tube location. Images were obtained and stored for the medical record. IMPRESSION: Successful exchange of 10 French left percutaneous nephrostomy tube. Electronically Signed   By: Malachy Moan M.D.   On: 07/28/2023 13:22   CT Angio Chest PE W and/or Wo Contrast Result Date: 07/24/2023 CLINICAL DATA:  Chest pain, shortness of breath, history of PE. CHF. Left lower quadrant abdominal pain, diarrhea. EXAM: CT ANGIOGRAPHY CHEST CT ABDOMEN AND PELVIS WITH CONTRAST TECHNIQUE: Multidetector CT imaging of the chest was performed using the standard protocol during bolus administration of intravenous contrast. Multiplanar CT image reconstructions and MIPs were obtained to evaluate the vascular anatomy. Multidetector CT imaging of the abdomen and pelvis was performed using the standard protocol during bolus administration of intravenous contrast. RADIATION DOSE REDUCTION: This exam was performed according to the departmental dose-optimization program which includes automated exposure control, adjustment of the mA and/or kV according to patient size and/or use of iterative reconstruction technique. CONTRAST:  OMNIPAQUE IOHEXOL 350 MG/ML SOLN COMPARISON:  CT chest dated 06/18/2023. CT abdomen/pelvis dated 04/07/2023. FINDINGS: CTA CHEST FINDINGS Cardiovascular: Satisfactory opacification the bilateral  pulmonary arteries  to the segmental level. No evidence of pulmonary embolism. Although not tailored for evaluation of the thoracic aorta, there is no evidence of thoracic aortic aneurysm or dissection. Mild atherosclerotic calcifications of the aortic arch. Cardiomegaly.  No pericardial effusion. Mediastinum/Nodes: No suspicious mediastinal lymphadenopathy. Visualized thyroid is unremarkable. Lungs/Pleura: Ground-glass opacity/mosaic attenuation in the lungs bilaterally, chronic, suggesting interstitial lung disease such as hypersensitivity pneumonitis. Mild platelike atelectasis in the right lower lobe. Mild atelectasis/scarring in the left upper lobe. No frank interstitial edema. No suspicious pulmonary nodules, noting motion degradation. No pleural effusion or pneumothorax. Musculoskeletal: Degenerative changes of the visualized thoracolumbar spine. Review of the MIP images confirms the above findings. CT ABDOMEN and PELVIS FINDINGS Hepatobiliary: Liver is within normal limits. Gallbladder is unremarkable. No intrahepatic or extrahepatic ductal dilatation. Pancreas: Within normal limits Spleen: Within normal limits. Adrenals/Urinary Tract: Adrenal glands are within normal limits. Right kidney is within normal limits. Mild left renal atrophy. Indwelling percutaneous nephrostomy. No hydronephrosis. Bladder is underdistended with mild perivesical stranding (series 4/image 80). Stomach/Bowel: Stomach is within normal limits. No evidence of bowel obstruction. Normal appendix (series 4/image 65). No colonic wall thickening or inflammatory changes. Vascular/Lymphatic: No evidence of abdominal aortic aneurysm. Atherosclerotic calcifications of the abdominal aorta and branch vessels, although vessels remain patent. IVC filter. No suspicious abdominopelvic lymphadenopathy. Reproductive: Uterus is within normal limits. No adnexal masses. Other: No abdominopelvic ascites. Mild body wall edema. Musculoskeletal: Mild degenerative changes of the  lumbar spine. Review of the MIP images confirms the above findings. IMPRESSION: No evidence of pulmonary embolism. Cardiomegaly.  No frank interstitial edema. Mild left renal atrophy. Indwelling percutaneous nephrostomy. No hydronephrosis. Mild perivesical stranding, suggesting cystitis. Aortic Atherosclerosis (ICD10-I70.0). Electronically Signed   By: Charline Bills M.D.   On: 07/24/2023 00:27   CT ABDOMEN PELVIS W CONTRAST Result Date: 07/24/2023 CLINICAL DATA:  Chest pain, shortness of breath, history of PE. CHF. Left lower quadrant abdominal pain, diarrhea. EXAM: CT ANGIOGRAPHY CHEST CT ABDOMEN AND PELVIS WITH CONTRAST TECHNIQUE: Multidetector CT imaging of the chest was performed using the standard protocol during bolus administration of intravenous contrast. Multiplanar CT image reconstructions and MIPs were obtained to evaluate the vascular anatomy. Multidetector CT imaging of the abdomen and pelvis was performed using the standard protocol during bolus administration of intravenous contrast. RADIATION DOSE REDUCTION: This exam was performed according to the departmental dose-optimization program which includes automated exposure control, adjustment of the mA and/or kV according to patient size and/or use of iterative reconstruction technique. CONTRAST:  OMNIPAQUE IOHEXOL 350 MG/ML SOLN COMPARISON:  CT chest dated 06/18/2023. CT abdomen/pelvis dated 04/07/2023. FINDINGS: CTA CHEST FINDINGS Cardiovascular: Satisfactory opacification the bilateral pulmonary arteries to the segmental level. No evidence of pulmonary embolism. Although not tailored for evaluation of the thoracic aorta, there is no evidence of thoracic aortic aneurysm or dissection. Mild atherosclerotic calcifications of the aortic arch. Cardiomegaly.  No pericardial effusion. Mediastinum/Nodes: No suspicious mediastinal lymphadenopathy. Visualized thyroid is unremarkable. Lungs/Pleura: Ground-glass opacity/mosaic attenuation in the  lungs bilaterally, chronic, suggesting interstitial lung disease such as hypersensitivity pneumonitis. Mild platelike atelectasis in the right lower lobe. Mild atelectasis/scarring in the left upper lobe. No frank interstitial edema. No suspicious pulmonary nodules, noting motion degradation. No pleural effusion or pneumothorax. Musculoskeletal: Degenerative changes of the visualized thoracolumbar spine. Review of the MIP images confirms the above findings. CT ABDOMEN and PELVIS FINDINGS Hepatobiliary: Liver is within normal limits. Gallbladder is unremarkable. No intrahepatic or extrahepatic ductal dilatation. Pancreas: Within normal limits Spleen: Within normal  limits. Adrenals/Urinary Tract: Adrenal glands are within normal limits. Right kidney is within normal limits. Mild left renal atrophy. Indwelling percutaneous nephrostomy. No hydronephrosis. Bladder is underdistended with mild perivesical stranding (series 4/image 80). Stomach/Bowel: Stomach is within normal limits. No evidence of bowel obstruction. Normal appendix (series 4/image 65). No colonic wall thickening or inflammatory changes. Vascular/Lymphatic: No evidence of abdominal aortic aneurysm. Atherosclerotic calcifications of the abdominal aorta and branch vessels, although vessels remain patent. IVC filter. No suspicious abdominopelvic lymphadenopathy. Reproductive: Uterus is within normal limits. No adnexal masses. Other: No abdominopelvic ascites. Mild body wall edema. Musculoskeletal: Mild degenerative changes of the lumbar spine. Review of the MIP images confirms the above findings. IMPRESSION: No evidence of pulmonary embolism. Cardiomegaly.  No frank interstitial edema. Mild left renal atrophy. Indwelling percutaneous nephrostomy. No hydronephrosis. Mild perivesical stranding, suggesting cystitis. Aortic Atherosclerosis (ICD10-I70.0). Electronically Signed   By: Charline Bills M.D.   On: 07/24/2023 00:27   DG Chest Portable 1 View Result  Date: 07/23/2023 CLINICAL DATA:  Shortness of breath and chest pain beginning this morning. History of CHF. Recent weight gain. EXAM: PORTABLE CHEST 1 VIEW COMPARISON:  06/18/2023 FINDINGS: Shallow inspiration. Normal heart size and pulmonary vascularity. No focal airspace disease or consolidation in the lungs. No blunting of costophrenic angles. No pneumothorax. Mediastinal contours appear intact. Calcification of the aorta. Degenerative changes in the spine and shoulders. IMPRESSION: No active disease. Electronically Signed   By: Burman Nieves M.D.   On: 07/23/2023 20:04   CT Angio Chest PE W/Cm &/Or Wo Cm Result Date: 06/18/2023 CLINICAL DATA:  Increasing shortness of breath EXAM: CT ANGIOGRAPHY CHEST WITH CONTRAST TECHNIQUE: Multidetector CT imaging of the chest was performed using the standard protocol during bolus administration of intravenous contrast. Multiplanar CT image reconstructions and MIPs were obtained to evaluate the vascular anatomy. RADIATION DOSE REDUCTION: This exam was performed according to the departmental dose-optimization program which includes automated exposure control, adjustment of the mA and/or kV according to patient size and/or use of iterative reconstruction technique. CONTRAST:  75mL OMNIPAQUE IOHEXOL 350 MG/ML SOLN COMPARISON:  X-ray from earlier in the same day. FINDINGS: Cardiovascular: Atherosclerotic calcifications of the aorta are noted. No aneurysmal dilatation is seen. Heart is mildly enlarged in size. Pulmonary artery shows a normal branching pattern without intraluminal filling defect to suggest pulmonary embolism. No significant coronary calcifications are noted. Mediastinum/Nodes: Thoracic inlet is within normal limits. No hilar or mediastinal adenopathy is noted. The esophagus is within normal limits. Lungs/Pleura: Mosaic attenuation is noted throughout both lungs consistent with air trapping. Minimal basilar atelectasis is seen. No focal confluent infiltrate is  noted. Upper Abdomen: Visualized upper abdomen is unremarkable. Musculoskeletal: Degenerative changes of the thoracic spine are seen. Review of the MIP images confirms the above findings. IMPRESSION: No evidence of pulmonary emboli. Changes of mild air trapping. Aortic Atherosclerosis (ICD10-I70.0). Electronically Signed   By: Alcide Clever M.D.   On: 06/18/2023 20:55   DG Chest 2 View Result Date: 06/18/2023 CLINICAL DATA:  Increased shortness of breath EXAM: CHEST - 2 VIEW COMPARISON:  Chest radiograph dated 02/10/2023 FINDINGS: Normal lung volumes. Left basilar patchy opacity. No pleural effusion or pneumothorax. The heart size and mediastinal contours are within normal limits. No acute osseous abnormality. IMPRESSION: Left basilar patchy opacity, likely atelectasis. Aspiration or pneumonia can be considered in the appropriate clinical setting. Electronically Signed   By: Agustin Cree M.D.   On: 06/18/2023 13:47   IR NEPHROSTOMY EXCHANGE LEFT Result Date: 05/27/2023 INDICATION: Urinary tract  infection and poor drainage from indwelling chronic left percutaneous nephrostomy tube. The tube was last exchanged on 04/06/2023. EXAM: LEFT PERCUTANEOUS NEPHROSTOMY TUBE EXCHANGE UNDER FLUOROSCOPY COMPARISON:  None Available. MEDICATIONS: None ANESTHESIA/SEDATION: None CONTRAST:  7 mL Omnipaque 300-administered into the collecting system(s) FLUOROSCOPY: Radiation Exposure Index (as provided by the fluoroscopic device): 4.5 mGy Kerma COMPLICATIONS: None immediate. PROCEDURE: Informed written consent was obtained from the patient after a thorough discussion of the procedural risks, benefits and alternatives. All questions were addressed. Maximal Sterile Barrier Technique was utilized including caps, mask, sterile gowns, sterile gloves, sterile drape, hand hygiene and skin antiseptic. A timeout was performed prior to the initiation of the procedure. A pre-existing 10 French percutaneous nephrostomy tube was injected with  contrast, cut and removed over a guidewire. A new 10 French catheter was advanced over the wire and formed in the left renal pelvis. Final catheter position was confirmed by fluoroscopy after contrast injection. The catheter was attached to a new gravity drainage bag and secured at the skin with an adhesive retention device. IMPRESSION: Exchange of left 10 French percutaneous nephrostomy tube under fluoroscopy. Electronically Signed   By: Irish Lack M.D.   On: 05/27/2023 12:34     Assessment and plan-   Acute pulmonary embolism, history of recurrent DVT, s/p IVC filter due to poor tolerability to anticoagulation.  No intervention per vascular surgeon.  Can not tolerate heparin drip.  Previously not able to tolerated low dose Xarelto. I expect that she will have difficulty tolerating low dose Eliquis.  Recommend Lovenox 30mg  SubQ twice daily [30% of her theraputic dosage of lovenox]. Close monitor Hb Palliative care consult.   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  Symptomatic anemia s/p PRBC transfusion.  Hb has improved.  Recommend 2 doses of IV venofer 200mg  to improve her iron reserve in case she develops GI bleeding.  Follow up outpatient.    Thank you for allowing me to participate in the care of this patient.   Rickard Patience, MD, PhD Hematology Oncology 08/08/2023

## 2023-08-08 NOTE — Plan of Care (Signed)

## 2023-08-08 NOTE — TOC Initial Note (Signed)
Transition of Care [facility]) - Initial/Assessment Note    Patient Details  Name: Courtney Mack MRN: [id number] Date of Birth: 1955-12-20  [facility] CM/SW Contact:    Lorri Frederick, LCSW Phone Number: 08/08/2023, 4:15 PM  Clinical Narrative:     CSW met with pt regarding PT recommendation for [facility].  Pt is agreeable to this, no agency choice indicated.  Pt lives with daughter and daughter's 5 children.  Pt states she needs [facility] agency that can come in the evening.  No current services.  Current DME in home: walker, rollator, shower chair, wheelchair.  Pt is asking for a reacher.                Expected Discharge Plan: Home w Home Health Services Barriers to Discharge: Continued Medical Work up   Patient Goals and CMS Choice Patient states their goals for this hospitalization and ongoing recovery are:: stay out of the hospital          Expected Discharge Plan and Services In-house Referral: Clinical Social Work   Post Acute Care Choice: Home Health Living arrangements for the past 2 months: Single Family Home                                      Prior Living Arrangements/Services Living arrangements for the past 2 months: Single Family Home Lives with:: Adult Children, Relatives (lives with daughter and her 5 kids) Patient language and need for interpreter reviewed:: Yes Do you feel safe going back to the place where you live?: Yes      Need for Family Participation in Patient Care: Yes (Comment) Care giver support system in place?: Yes (comment) Current home services: DME Criminal Activity/Legal Involvement Pertinent to Current Situation/Hospitalization: No - Comment as needed  Activities of Daily Living   ADL Screening (condition at time of admission) Independently performs ADLs?: Yes (appropriate for developmental age) Is the patient deaf or have difficulty hearing?: No Does the patient have difficulty seeing, even when wearing  glasses/contacts?: No Does the patient have difficulty concentrating, remembering, or making decisions?: No  Permission Sought/Granted Permission sought to share information with : Family Supports Permission granted to share information with : Yes, Verbal Permission Granted  Share Information with NAME: daughters [name] and Richrd Sox           Emotional Assessment Appearance:: Appears stated age Attitude/Demeanor/Rapport: Engaged Affect (typically observed): Appropriate, Pleasant Orientation: :  (not recorded) Alcohol / Substance Use: Not Applicable Psych Involvement: No (comment)  Admission diagnosis:  Acute pulmonary embolism (HCC) [I26.99] Other pulmonary embolism without acute cor pulmonale, unspecified chronicity (HCC) [I26.99] Acute pulmonary embolism without acute cor pulmonale (HCC) [I26.99] Patient Active Problem List   Diagnosis Date Noted   Other pulmonary embolism without acute cor pulmonale (HCC) 08/07/2023   Chronic deep vein thrombosis (DVT) of proximal vein of right lower extremity (HCC) 08/05/2023   Acute pulmonary embolism (HCC) 08/04/2023   Dysuria 06/22/2023   SOB (shortness of breath) 06/18/2023   HTN (hypertension) 06/18/2023   Complicated UTI (urinary tract infection) 05/26/2023   Diarrhea 05/26/2023   Blocked nephrostomy tube with hydronephrosis and hydroureter (HCC) 04/05/2023   Hypertension    Pressure injury of skin 02/11/2023   Severe sepsis with acute organ dysfunction (HCC) 02/10/2023   Obesity (BMI 30-39.9) 11/22/2022   Sepsis due to gram-negative UTI (HCC) 11/21/2022   Acute pyelonephritis 11/21/2022  Dyspnea 11/21/2022   Chest pain 11/21/2022   Hypokalemia 10/25/2022   Chronic obstructive pulmonary disease (COPD) (HCC) 10/24/2022   Iron deficiency anemia 10/24/2022   Symptomatic anemia 08/08/2022   COPD (chronic obstructive pulmonary disease) (HCC) 08/07/2022   Sepsis due to pneumonia (HCC) 06/12/2022   Multifocal pneumonia 06/12/2022    GERD without esophagitis 06/12/2022   Generalized weakness 06/12/2022   Nephrostomy status (HCC) 06/12/2022   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) 04/16/2022   GI bleed 03/14/2022   Acute on chronic anemia 02/24/2022   Hypotension 02/24/2022   Acute cystitis with hematuria    Lymphedema 02/08/2022   Hematuria 12/24/2021   History of pulmonary embolus (PE) 12/24/2021   Chronic radiation cystitis 11/17/2021   Hydronephrosis of left kidney    Gastric antral vascular ectasia    Complicated urinary tract infection 09/13/2021   AKI (acute kidney injury) (HCC) 09/13/2021   Melena 07/18/2021   Acute on chronic blood loss anemia 07/18/2021   HLD (hyperlipidemia) 06/20/2021   Chronic diastolic CHF (congestive heart failure) (HCC) 06/20/2021   Risk factors for obstructive sleep apnea 11/10/2020   IDA (iron deficiency anemia) 09/21/2020   Endometrial cancer (HCC) 09/13/2020   Recurrent deep vein thrombosis (DVT) (HCC) 09/06/2020   Obesity, Class III, BMI 40-49.9 (morbid obesity) (HCC) 08/23/2020   Dyslipidemia 08/23/2020   PCP:  [facility]:   [id number] [place], [place] - [address] AT [facility] OF [address] [place] [address] [place] [address] Phone: [phone] Fax: [phone]     Social Drivers of Health (SDOH) Social History: SDOH Screenings   Food Insecurity: No Food Insecurity (08/05/2023)  Housing: Low Risk  (08/05/2023)  Transportation Needs: No Transportation Needs (08/05/2023)  Utilities: Not At Risk (08/05/2023)  Depression (PHQ2-9): Low Risk  (04/11/2022)  Social Connections: Unknown (08/05/2023)  Tobacco Use: Low Risk  (08/05/2023)   SDOH Interventions:     Readmission Risk Interventions    08/07/2023   10:26 AM 07/24/2023   11:14 AM 05/29/2023    9:42 AM  Readmission Risk Prevention Plan  Transportation Screening  Complete   Medication Review (RN Care Manager) Complete  Complete   PCP or Specialist appointment within 3-5 days of discharge Complete Complete   HRI or Home Care Consult   Patient refused  SW Recovery Care/Counseling Consult Complete Complete Complete  Palliative Care Screening Not Applicable Not Applicable Not Applicable  Skilled Nursing Facility Not Applicable Not Applicable Not Applicable

## 2023-08-08 NOTE — Consult Note (Signed)
Consultation Note Date: 08/08/2023   Patient Name: Courtney Mack  DOB: 29-Aug-1955  MRN: [id number]  Age / Sex: 68 y.o., female  PCP: [facility] Referring Physician: Jonah Blue, MD  Reason for Consultation: Establishing goals of care   HPI/Brief Hospital Course: 68 y.o. female  with past medical history of GAVE syndrome, recurrent VTE s/p IVC filter placement, PE (unable to tolerate AC due to significant GI bleed), history of cervical cancer, left sided hydronephrosis s/p nephrostomy tube placement, DM, HTN and HLD admitted from home on 08/04/2023 with [facility].   Found to have subsegmental PE. DVT (+) chronic mixed occlusive and nonocclusive from R femoral to popliteal vein. Started on heparin but hemoglobin dropped. Oncology consulted, recommendations to start SQ Lovenox  Noted recent hospitalization 1/15-1/21 for complicated UTI  Palliative medicine was consulted for assisting with goals of care conversations.  Subjective:  Extensive chart review has been completed prior to meeting patient including labs, vital signs, imaging, progress notes, orders, and available advanced directive documents from current and previous encounters.  Visited with Ms. Ragsdale at her bedside. She is awake, alert, and able to engage in goals of care conversations. No family at bedside during time of visit.  Introduced myself as a Publishing rights manager as a member of the palliative care team. Explained palliative medicine is specialized medical care for people living with serious illness. It focuses on providing relief from the symptoms and stress of a serious illness. The goal is to improve quality of life for both the patient and the family.   Ms. Wooley shares a brief life review. She is not currently married, has two daughters, [name] and [place] and multiple grandchildren. She has lived with her daughter [name] for many years. At baseline,  she is able to independently perform ADL's. She can ambulate around her home with a walker. She prepares her own meals. She and her daughter decided a few years prior that she should no longer drive.  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 She enjoys playing games on her tablet and watching movies.  Ms. Odonell able to share her understanding of current medical condition. She is well aware of her medical history and current complications. She shares her parents had similar medical issues as she so she has experience in understanding. She is hopeful that Lovenox injections do not cause a bleed.  We discussed patient's current illness and what it means in the larger context of patient's on-going co-morbidities. Natural disease trajectory and expectations at EOL were discussed.   Ms. Deblanc shares she has not completed AD in the past but wishes to appoint both of her daughters as surrogate decision makers. She shares she has had goals of care conversations with her daughters in the past and has been clear that she is open to resuscitative efforts but would not be accepting of long term life preserving measures.  Ms. Sprecher shares one of her grandsons tragically lost their father when he was young and focuses on improving and healing to be around to support him through his teenage years.  I discussed importance of continued conversations with family/support persons and all members of their medical team regarding overall plan of care and treatment options ensuring decisions are in alignment with patients goals of care.  All questions/concerns addressed. PMT will continue to follow and support patient as needed.  Objective: Primary Diagnoses: Present on Admission:  Acute pulmonary embolism (HCC)  COPD (chronic obstructive pulmonary  disease) (HCC)  Chronic diastolic CHF (congestive heart failure) (HCC)  Hypotension  Iron deficiency anemia  Hypokalemia  Other pulmonary  embolism without acute cor pulmonale (HCC)   Physical Exam Constitutional:      General: She is not in acute distress.    Appearance: She is not ill-appearing.  Pulmonary:     Effort: Pulmonary effort is normal. No respiratory distress.  Skin:    General: Skin is warm and dry.  Neurological:     Mental Status: She is alert and oriented to person, place, and time.     Motor: Weakness present.  Psychiatric:        Mood and Affect: Mood normal.        Thought Content: Thought content normal.     Vital Signs: BP (!) 112/52 (BP Location: Left Arm)   Pulse 73   Temp 98.4 F (36.9 C) (Oral)   Resp 18   Ht 4\' 11"  (1.499 m)   Wt 100.8 kg   SpO2 97%   BMI 44.88 kg/m  Pain Scale: 0-10   Pain Score: 4   IO: Intake/output summary:  Intake/Output Summary (Last 24 hours) at 08/08/2023 1405 Last data filed at 08/08/2023 0700 Gross per 24 hour  Intake 350 ml  Output 2950 ml  Net -2600 ml    LBM: Last BM Date : 08/06/23 Baseline Weight: Weight: 100.8 kg Most recent weight: Weight: 100.8 kg       Palliative Assessment/Data:   Assessment and Plan  SUMMARY OF RECOMMENDATIONS   Time for outcomes PMT to continue to follow for ongoing needs and support  Palliative Prophylaxis:   Bowel Regimen, Delirium Protocol and Frequent Pain Assessment  Spiritual:  Desire for ongoing Chaplain support: No Education provided on Chaplain services offered through PMT, education provided on Grief/Bereavement support services   Discussed With: Primary team   Thank you for this consult and allowing Palliative Medicine to participate in the care of [name]. Palliative medicine will continue to follow and assist as needed.   Time Total: 75 minutes  Time spent includes: Detailed review of medical records (labs, imaging, vital signs), medically appropriate exam (mental status, respiratory, cardiac, skin), discussed with treatment team, counseling and educating patient, family and  staff, documenting clinical information, medication management and coordination of care.   Signed by: Leeanne Deed, DNP, AGNP-C Palliative Medicine    Please contact Palliative Medicine Team phone at [phone] for questions and concerns.  For individual provider: See Loretha Stapler

## 2023-08-08 NOTE — Progress Notes (Signed)
Progress Note   Patient: Courtney Mack ZOX:[id number] DOB: 1956-03-13 DOA: 08/04/2023     1 DOS: the patient was seen and examined on 08/08/2023   Brief hospital course: 68yo with h/o GAVE syndrome, recurrent VTE s/p IVC filter (not on AC due to h/o GI bleeding), chronic diastolic CHF, COPD, left-sided hydronephrosis s/p nephrostomy tube placement, DM, HTN, and HLD who presented on 1/27 with SOB and was found to have a subsegmental PE.  She was also recently hospitalized (1/15-21) for complicated UTI, treated with ceftriaxone.  She was started on heparin but had decreasing Hgb and so this was stopped.  Vascular surgery consulted and recommended LE DVT US to determine if thrombectomy is needed; B [place] + for chronic mixed occlusive and nonocclusive DVT from R femoral to popliteal vein, similar to 02/2022.     Acute pulmonary embolism  Patient with known prior VTE, recurrent Has IVC filter Unable to take [facility] due to GAVE syndrome and recurrent GI bleeding Presented with SOB CTA with new subsegmental PE without R heart strain Discussed with vascular surgery - no indication for pulmonary thrombectomy but she has known chronic right lower extremity DVT and DVT US shows chronicity Vascular surgery consulted; patient is not a candidate for any vascular procedure and replacing IVC filter would not likely help but would significantly increase her risk of a large PE peri-procedure Heparin started but she developed markedly worsening Hgb and so it was stopped Recommend palliative care consult, as she is in a very difficult position Oncology also consulted Started on Lovenox 30 mg SQ BID per oncology   ABLA Presumed recurrent GI bleeding in the setting of GAVE syndrome and started on heparin with dropping HGB, likely bleeding Normocytic, iron does not appear to be indicated (she reported that she was due to infusion but will hold) Hematology consulted Hgb stable today, will continue to follow Given IV  iron per oncology, 200 mg x 2 doses   H/o GAVE syndrome  History of severe GI bleeding in the past requiring multiple blood transfusions Hgb down from 9.1 to 7.0 to 8.5 Transfused 2 units PRBC No current frank bleeding but it is presumed currently   Recurrent UTI No symptoms currently but she was recently hospitalized She reports 7 UTIs this year Messaged her urologist (Dr. Lonna Cobb) re: starting prophylaxis - it is unlikely to be helpful in her scenario but if she wants to try it trimethoprim 100 mg daily is the best option After discussion, she would like to try it; she understands that it is unlikely to significantly help   COPD (chronic obstructive pulmonary disease) Initial concern for COPD exacerbation, however pulmonary examination is reassuring with no evidence of wheezing. Continue DuoNebs as needed Continue home bronchodilators Hold off on further systemic steroids   Hypotension Low threshold to restart home midodrine if needed   Chronic diastolic CHF (congestive heart failure)  No evidence of hypervolemia on examination Continue home Lasix   Lymphedema Chronic lymphedema noted on examination that is stable.         Consultants: Vascular surgery Oncology Palliative care PT OT   Procedures: DVT US 1/28   Antibiotics: None   30 Day Unplanned Readmission Risk Score    Flowsheet Row ED to Hosp-Admission (Current) from [facility]04[facility]20[facility]  30 Day Unplanned Readmission Risk Score (%) 43.84 Filed at 08/08/2023 0801       This score is the patient's risk of an unplanned readmission within 30 days  of being discharged (0 -100%). The score is based on dignosis, age, lab data, medications, orders, and past utilization.   Low:  0-14.9   Medium: 15-21.9   High: 22-29.9   Extreme: 30 and above           Subjective: Continues to feel better, getting stronger.   Objective: Vitals:   08/08/23 0753 08/08/23 1621  BP:  (!) 112/52 (!) 104/53  Pulse: 73 73  Resp: 18 18  Temp: 98.4 F (36.9 C) 98 F (36.7 C)  SpO2: 97% 97%    Intake/Output Summary (Last 24 hours) at 08/08/2023 1650 Last data filed at 08/08/2023 0700 Gross per 24 hour  Intake 350 ml  Output 1150 ml  Net -800 ml   Filed Weights   08/04/23 1542  Weight: 100.8 kg    Exam:  General:  Appears calm and comfortable and is in NAD Eyes:   EOMI, normal lids, iris ENT:  grossly normal hearing, lips & tongue, mmm Neck:  no LAD, masses or thyromegaly Cardiovascular:  RRR, no m/r/g. No LE edema.  Respiratory:   CTA bilaterally with no wheezes/rales/rhonchi.  Normal respiratory effort. Abdomen:  soft, NT, ND; L nephrostomy tube with clear yellow urine Skin:  no rash or induration seen on limited exam Musculoskeletal:  grossly normal tone BUE/BLE, good ROM, no bony abnormality Psychiatric:  grossly normal mood and affect, speech fluent and appropriate, AOx3 Neurologic:  CN 2-12 grossly intact, moves all extremities in coordinated fashion  Data Reviewed: I have reviewed the patient's lab results since admission.  Pertinent labs for today include:   Glucose 106 BUN 28, stable WBC 4.3 Hgb 9, stable     Family Communication: None present  Disposition: Status is: Inpatient Remains inpatient appropriate because: ongoing monitoring     Time spent: 50 minutes  Unresulted Labs (From admission, onward)     Start     Ordered   08/09/23 0500  CBC with Differential/Platelet  Daily,   R      08/08/23 1650             Author: Jonah Blue, MD 08/08/2023 4:50 PM  For on call review [email or web address].

## 2023-08-09 DIAGNOSIS — I825Y1 Chronic embolism and thrombosis of unspecified deep veins of right proximal lower extremity: Secondary | ICD-10-CM | POA: Diagnosis not present

## 2023-08-09 DIAGNOSIS — D500 Iron deficiency anemia secondary to blood loss (chronic): Secondary | ICD-10-CM | POA: Diagnosis not present

## 2023-08-09 DIAGNOSIS — I2699 Other pulmonary embolism without acute cor pulmonale: Secondary | ICD-10-CM | POA: Diagnosis not present

## 2023-08-09 DIAGNOSIS — Z515 Encounter for palliative care: Secondary | ICD-10-CM | POA: Diagnosis not present

## 2023-08-09 LAB — CBC WITH DIFFERENTIAL/PLATELET
Abs Immature Granulocytes: 0.03 10*3/uL (ref 0.00–0.07)
Basophils Absolute: 0 10*3/uL (ref 0.0–0.1)
Basophils Relative: 1 %
Eosinophils Absolute: 0.3 10*3/uL (ref 0.0–0.5)
Eosinophils Relative: 5 %
HCT: 29.2 % — ABNORMAL LOW (ref 36.0–46.0)
Hemoglobin: 9 g/dL — ABNORMAL LOW (ref 12.0–15.0)
Immature Granulocytes: 1 %
Lymphocytes Relative: 15 %
Lymphs Abs: 0.7 10*3/uL (ref 0.7–4.0)
MCH: 28.1 pg (ref 26.0–34.0)
MCHC: 30.8 g/dL (ref 30.0–36.0)
MCV: 91.3 fL (ref 80.0–100.0)
Monocytes Absolute: 0.3 10*3/uL (ref 0.1–1.0)
Monocytes Relative: 7 %
Neutro Abs: 3.4 10*3/uL (ref 1.7–7.7)
Neutrophils Relative %: 71 %
Platelets: 244 10*3/uL (ref 150–400)
RBC: 3.2 MIL/uL — ABNORMAL LOW (ref 3.87–5.11)
RDW: 15.6 % — ABNORMAL HIGH (ref 11.5–15.5)
WBC: 4.7 10*3/uL (ref 4.0–10.5)
nRBC: 0 % (ref 0.0–0.2)

## 2023-08-09 LAB — GLUCOSE, CAPILLARY
Glucose-Capillary: 101 mg/dL — ABNORMAL HIGH (ref 70–99)
Glucose-Capillary: 143 mg/dL — ABNORMAL HIGH (ref 70–99)
Glucose-Capillary: 98 mg/dL (ref 70–99)
Glucose-Capillary: 105 mg/dL — ABNORMAL HIGH (ref 70–99)

## 2023-08-09 NOTE — TOC Progression Note (Addendum)
Transition of Care [facility]) - Progression Note    Patient Details  Name: Courtney Mack MRN: [id number] Date of Birth: 01-02-1956  [facility] CM/SW Contact  Liliana Cline, LCSW Phone Number: 08/09/2023, 11:49 AM  Clinical Narrative:    Per [facility], patient has had [facility] in the past. Reached out to [facility] with [facility] - she is able to accept for HHPT and OT.   Expected Discharge Plan: Home w Home Health Services Barriers to Discharge: Continued Medical Work up  Expected Discharge Plan and Services In-house Referral: Clinical Social Work   Post Acute Care Choice: Home Health Living arrangements for the past 2 months: Single Family Home                                       Social Determinants of Health (SDOH) Interventions SDOH Screenings   Food Insecurity: No Food Insecurity (08/05/2023)  Housing: Low Risk  (08/05/2023)  Transportation Needs: No Transportation Needs (08/05/2023)  Utilities: Not At Risk (08/05/2023)  Depression (PHQ2-9): Low Risk  (04/11/2022)  Social Connections: Unknown (08/05/2023)  Tobacco Use: Low Risk  (08/05/2023)    Readmission Risk Interventions    08/07/2023   10:26 AM 07/24/2023   11:14 AM 05/29/2023    9:42 AM  Readmission Risk Prevention Plan  Transportation Screening  Complete   Medication Review (RN Care Manager) Complete Complete   PCP or Specialist appointment within 3-5 days of discharge Complete Complete   HRI or Home Care Consult   Patient refused  SW Recovery Care/Counseling Consult Complete Complete Complete  Palliative Care Screening Not Applicable Not Applicable Not Applicable  Skilled Nursing Facility Not Applicable Not Applicable Not Applicable

## 2023-08-09 NOTE — Progress Notes (Signed)
Progress Note   Patient: Courtney Mack ZOX:[id number] DOB: 19-Apr-1956 DOA: 08/04/2023     2 DOS: the patient was seen and examined on 08/09/2023   Brief hospital course: 67yo with h/o GAVE syndrome, recurrent VTE s/p IVC filter (not on AC due to h/o GI bleeding), chronic diastolic CHF, COPD, left-sided hydronephrosis s/p nephrostomy tube placement, DM, HTN, and HLD who presented on 1/27 with SOB and was found to have a subsegmental PE.  She was also recently hospitalized (1/15-21) for complicated UTI, treated with ceftriaxone.  She was started on heparin but had decreasing Hgb and so this was stopped.  Vascular surgery consulted and recommended LE DVT US to determine if thrombectomy is needed; B [place] + for chronic mixed occlusive and nonocclusive DVT from R femoral to popliteal vein, similar to 02/2022.  No vascular intervention indicated.  Oncology consulted and recommend BID Lovenox 30 mg with IV iron infusions.  Stable Hgb, possibly home 2/2 with home health.  Assessment and Plan:   Acute pulmonary embolism  Patient with known prior VTE, recurrent Has IVC filter Unable to take [facility] due to GAVE syndrome and recurrent GI bleeding Presented with SOB CTA with new subsegmental PE without R heart strain Discussed with vascular surgery - no indication for pulmonary thrombectomy but she has known chronic right lower extremity DVT and DVT US shows chronicity Vascular surgery consulted; patient is not a candidate for any vascular procedure and replacing IVC filter would not likely help but would significantly increase her risk of a large PE peri-procedure Heparin started but she developed markedly worsening Hgb and so it was stopped Recommend palliative care consult, as she is in a very difficult position Oncology also consulted Started on Lovenox 30 mg SQ BID per oncology Appears to be stable and slowly improving Possible dc on 2/2 with home health   ABLA Presumed recurrent GI bleeding in the  setting of GAVE syndrome and started on heparin with dropping HGB, likely bleeding Normocytic, iron does not appear to be indicated (she reported that she was due to infusion but will hold) Hematology consulted Hgb stable today, will continue to follow Given IV iron per oncology, 200 mg x 2 doses   H/o GAVE syndrome  History of severe GI bleeding in the past requiring multiple blood transfusions Hgb down from 9.1 to 7.0 to 8.5 to 9.0 Transfused 2 units PRBC No current frank bleeding but it is presumed currently   Recurrent UTI No symptoms currently but she was recently hospitalized She reports 7 UTIs this year Messaged her urologist (Dr. Lonna Cobb) re: starting prophylaxis - it is unlikely to be helpful in her scenario but if she wants to try it trimethoprim 100 mg daily is the best option After discussion, she would like to try it; she understands that it is unlikely to significantly help   COPD (chronic obstructive pulmonary disease) Initial concern for COPD exacerbation, however pulmonary examination is reassuring with no evidence of wheezing. Continue DuoNebs as needed Continue home bronchodilators Hold off on further systemic steroids   Hypotension Low threshold to restart home midodrine if needed   Chronic diastolic CHF (congestive heart failure)  No evidence of hypervolemia on examination Continue home Lasix   Lymphedema Chronic lymphedema noted on examination that is stable.         Consultants: Vascular surgery Oncology Palliative care PT OT   Procedures: DVT US 1/28   Antibiotics: None    30 Day Unplanned Readmission Risk Score    Flowsheet  Row ED to Hosp-Admission (Current) from 08/04/2023 in [facility]  30 Day Unplanned Readmission Risk Score (%) 44.27 Filed at 08/09/2023 0800       This score is the patient's risk of an unplanned readmission within 30 days of being discharged (0 -100%). The score is based on  dignosis, age, lab data, medications, orders, and past utilization.   Low:  0-14.9   Medium: 15-21.9   High: 22-29.9   Extreme: 30 and above           Subjective: Continues to slowly feel better, less SOB with ambulation, took about 4 steps yesterday, thinks 1 more day will be good for her in the hospital.   Objective: Vitals:   08/09/23 0802 08/09/23 1458  BP: (!) 101/46 (!) 87/43  Pulse: 81 79  Resp: 16 16  Temp: 98.6 F (37 C) 98.2 F (36.8 C)  SpO2: 97% 96%    Intake/Output Summary (Last 24 hours) at 08/09/2023 1721 Last data filed at 08/09/2023 1348 Gross per 24 hour  Intake 720 ml  Output 2200 ml  Net -1480 ml   Filed Weights   08/04/23 1542  Weight: 100.8 kg    Exam:  General:  Appears calm and comfortable and is in NAD Eyes:   EOMI, normal lids, iris ENT:  grossly normal hearing, lips & tongue, mmm Neck:  no LAD, masses or thyromegaly Cardiovascular:  RRR, no m/r/g. No LE edema.  Respiratory:   CTA bilaterally with no wheezes/rales/rhonchi.  Normal respiratory effort. Abdomen:  soft, NT, ND; L nephrostomy tube with clear yellow urine Skin:  no rash or induration seen on limited exam Musculoskeletal:  grossly normal tone BUE/BLE, good ROM, no bony abnormality Psychiatric:  grossly normal mood and affect, speech fluent and appropriate, AOx3 Neurologic:  CN 2-12 grossly intact, moves all extremities in coordinated fashion  Data Reviewed: I have reviewed the patient's lab results since admission.  Pertinent labs for today include:   Stable CBC     Family Communication: None present  Disposition: Status is: Inpatient Remains inpatient appropriate because: ongoing monitoring     Time spent: 50 minutes  Unresulted Labs (From admission, onward)     Start     Ordered   08/10/23 0500  Basic metabolic panel  Tomorrow morning,   R        08/09/23 1721   08/09/23 0500  CBC with Differential/Platelet  Daily,   R      08/08/23 1650              Author: Jonah Blue, MD 08/09/2023 5:21 PM  For on call review [email or web address].

## 2023-08-09 NOTE — Plan of Care (Signed)

## 2023-08-09 NOTE — Progress Notes (Signed)
                                                                                                                                                                                                           Daily Progress Note   Patient Name: Courtney Mack       Date: 08/09/2023 DOB: [birth year]  Age: 68 y.o. MRN#: [id number] Attending Physician: Jonah Blue, MD Primary Care Physician: [facility] Admit Date: 08/04/2023  Reason for Consultation/Follow-up: Establishing goals of care  HPI/Brief Hospital Review:  68 y.o. female  with past medical history of GAVE syndrome, recurrent VTE s/p IVC filter placement, PE (unable to tolerate AC due to significant GI bleed), history of cervical cancer, left sided hydronephrosis s/p nephrostomy tube placement, DM, HTN and HLD admitted from home on 08/04/2023 with [facility].    Found to have subsegmental PE. DVT (+) chronic mixed occlusive and nonocclusive from R femoral to popliteal vein. Started on heparin but hemoglobin dropped. Oncology consulted, recommendations to start SQ Lovenox   Noted recent hospitalization 1/15-1/21 for complicated UTI   Palliative medicine was consulted for assisting with goals of care conversations.  Subjective: Extensive chart review has been completed prior to meeting patient including labs, vital signs, imaging, progress notes, orders, and available advanced directive documents from current and previous encounters.    Visited with Courtney Mack at her bedside, she is awake, alert and able to engage in conversation. She reports feeling well today without acute complaints. Hemoglobin remains stable.  She is eager to return home, plan to return home with [facility] services following. No acute ongoing palliative needs.  TOC aware of her request for insurance to cover reacher.  Answered and addressed all questions and concerns. PMT to step away from daily visits, please reengage if goals change or as  appropriate.  Thank you for allowing the Palliative Medicine Team to assist in the care of this patient.  Total time:  25 minutes  Time spent includes: Detailed review of medical records (labs, imaging, vital signs), medically appropriate exam (mental status, respiratory, cardiac, skin), discussed with treatment team, counseling and educating patient, family and staff, documenting clinical information, medication management and coordination of care.  Leeanne Deed, DNP, AGNP-C Palliative Medicine   Please contact Palliative Medicine Team phone at [phone] for questions and concerns.

## 2023-08-10 DIAGNOSIS — I2699 Other pulmonary embolism without acute cor pulmonale: Secondary | ICD-10-CM | POA: Diagnosis not present

## 2023-08-10 LAB — CBC WITH DIFFERENTIAL/PLATELET
Abs Immature Granulocytes: 0.01 10*3/uL (ref 0.00–0.07)
Basophils Absolute: 0 10*3/uL (ref 0.0–0.1)
Basophils Relative: 1 %
Eosinophils Absolute: 0.3 10*3/uL (ref 0.0–0.5)
Eosinophils Relative: 6 %
HCT: 28.9 % — ABNORMAL LOW (ref 36.0–46.0)
Hemoglobin: 8.8 g/dL — ABNORMAL LOW (ref 12.0–15.0)
Immature Granulocytes: 0 %
Lymphocytes Relative: 18 %
Lymphs Abs: 0.8 10*3/uL (ref 0.7–4.0)
MCH: 28 pg (ref 26.0–34.0)
MCHC: 30.4 g/dL (ref 30.0–36.0)
MCV: 92 fL (ref 80.0–100.0)
Monocytes Absolute: 0.4 10*3/uL (ref 0.1–1.0)
Monocytes Relative: 8 %
Neutro Abs: 3.1 10*3/uL (ref 1.7–7.7)
Neutrophils Relative %: 67 %
Platelets: 242 10*3/uL (ref 150–400)
RBC: 3.14 MIL/uL — ABNORMAL LOW (ref 3.87–5.11)
RDW: 15.9 % — ABNORMAL HIGH (ref 11.5–15.5)
WBC: 4.7 10*3/uL (ref 4.0–10.5)
nRBC: 0 % (ref 0.0–0.2)

## 2023-08-10 LAB — BASIC METABOLIC PANEL
Anion gap: 6 (ref 5–15)
BUN: 26 mg/dL — ABNORMAL HIGH (ref 8–23)
CO2: 22 mmol/L (ref 22–32)
Calcium: 8.5 mg/dL — ABNORMAL LOW (ref 8.9–10.3)
Chloride: 108 mmol/L (ref 98–111)
Creatinine, Ser: 0.92 mg/dL (ref 0.44–1.00)
GFR, Estimated: >60 mL/min (ref >60)
Glucose, Bld: 108 mg/dL — ABNORMAL HIGH (ref 70–99)
Potassium: 3.9 mmol/L (ref 3.5–5.1)
Sodium: 136 mmol/L (ref 135–145)

## 2023-08-10 LAB — TYPE AND SCREEN
ABO/RH(D): A POS
Antibody Screen: NEGATIVE
Unit division: 0
Unit division: 0
Unit division: 0
Unit division: 0

## 2023-08-10 LAB — BPAM RBC
Blood Product Expiration Date: 202502262359
Blood Product Expiration Date: 202502262359
Blood Product Expiration Date: 202503032359
Blood Product Expiration Date: 202503032359
ISSUE DATE / TIME: 202501292342
Unit Type and Rh: 5100
Unit Type and Rh: 5100
Unit Type and Rh: 6200
Unit Type and Rh: 6200

## 2023-08-10 LAB — GLUCOSE, CAPILLARY
Glucose-Capillary: 106 mg/dL — ABNORMAL HIGH (ref 70–99)
Glucose-Capillary: 89 mg/dL (ref 70–99)
Glucose-Capillary: 140 mg/dL — ABNORMAL HIGH (ref 70–99)

## 2023-08-10 MED ORDER — ENOXAPARIN SODIUM 30 MG/0.3ML IJ SOSY: 30.0000 mg | PREFILLED_SYRINGE | Freq: Two times a day (BID) | INTRAMUSCULAR | 0 refills | Status: AC

## 2023-08-10 MED ORDER — TRIMETHOPRIM 100 MG PO TABS: 100.0000 mg | ORAL_TABLET | Freq: Every day | ORAL | 0 refills | Status: DC

## 2023-08-10 NOTE — Progress Notes (Signed)
Discharge instructions were reviewed with patient. IV was removed. Questions were encourage and answered. Belongings collected by patient.

## 2023-08-10 NOTE — Discharge Summary (Signed)
Physician Discharge Summary   Patient: Courtney Mack MRN: [id number] DOB: Dec 06, 1955  Admit date:     08/04/2023  Discharge date: 08/10/23  Discharge Physician: Jonah Blue   PCP: [facility]   Recommendations at discharge:   You are being discharged with home health therapy (PT, OT) Continue Lovenox injections twice daily If GI bleeding develops or shortness of breath worsens, stop Lovenox and go to oncology, PCP, or the ER Follow up with Dr. Cathie Hoops later this week Follow up with PCP in 1-2 weeks Continue trimethoprim once daily for UTI prevention Follow up with urology  Discharge Diagnoses: Principal Problem:   Acute pulmonary embolism (HCC) Active Problems:   COPD (chronic obstructive pulmonary disease) (HCC)   Hypotension   Chronic diastolic CHF (congestive heart failure) (HCC)   Lymphedema   Iron deficiency anemia   Hypokalemia   Chronic deep vein thrombosis (DVT) of proximal vein of right lower extremity (HCC)   Other pulmonary embolism without acute cor pulmonale [facility])   Hospital Course: [phone] with h/o GAVE syndrome, recurrent VTE s/p IVC filter (not on AC due to h/o GI bleeding), chronic diastolic CHF, COPD, left-sided hydronephrosis s/p nephrostomy tube placement, DM, HTN, and HLD who presented on 1/27 with SOB and was found to have a subsegmental PE.  She was also recently hospitalized (1/15-21) for complicated UTI, treated with ceftriaxone.  She was started on heparin but had decreasing Hgb and so this was stopped.  Vascular surgery consulted and recommended LE DVT US to determine if thrombectomy is needed; B [place] + for chronic mixed occlusive and nonocclusive DVT from R femoral to popliteal vein, similar to 02/2022.  No vascular intervention indicated.  Oncology consulted and recommend BID Lovenox 30 mg with IV iron infusions.  Stable Hgb, possibly home 2/2 with home health.  Assessment and Plan:  Acute pulmonary embolism  Patient with known  prior VTE, recurrent Has IVC filter Unable to take [facility] due to GAVE syndrome and recurrent GI bleeding Presented with SOB CTA with new subsegmental PE without R heart strain Discussed with vascular surgery - no indication for pulmonary thrombectomy but she has known chronic right lower extremity DVT and DVT US shows chronicity Vascular surgery consulted; patient is not a candidate for any vascular procedure and replacing IVC filter would not likely help but would significantly increase her risk of a large PE peri-procedure Heparin started but she developed markedly worsening Hgb and so it was stopped Recommend palliative care consult, as she is in a very difficult position Oncology also consulted Started on Lovenox 30 mg SQ BID per oncology Appears to be stable and slowly improving Dc on 2/2 with home health   ABLA Presumed recurrent GI bleeding in the setting of GAVE syndrome and started on heparin with dropping HGB, likely bleeding Normocytic, iron does not appear to be indicated (she reported that she was due to infusion but will hold) Hematology consulted Hgb stable currently Given IV iron per oncology, 200 mg x 2 doses   H/o GAVE syndrome  History of severe GI bleeding in the past requiring multiple blood transfusions Hgb down from 9.1 to 7.0 to 8.5 to 9.0 Transfused 2 units PRBC No current frank bleeding but it is presumed currently   Recurrent UTI No symptoms currently but she was recently hospitalized She reports 7 UTIs this year Messaged her urologist (Dr. Lonna Cobb) re: starting prophylaxis - it is unlikely to be helpful in her scenario but if she wants to try  it trimethoprim 100 mg daily is the best option After discussion, she would like to try it; she understands that it is unlikely to significantly help   COPD (chronic obstructive pulmonary disease) Initial concern for COPD exacerbation, however pulmonary examination is reassuring with no evidence of wheezing. Continue  DuoNebs as needed Continue Symbicort, Breo Hold off on further systemic steroids   Hypotension Low threshold to restart home midodrine if needed   Chronic diastolic CHF (congestive heart failure)  No evidence of hypervolemia on examination Continue home Lasix   Lymphedema Chronic lymphedema noted on examination that is stable.         Consultants: Vascular surgery Oncology Palliative care PT OT   Procedures: DVT US 1/28   Antibiotics: None    30 Day Unplanned Readmission Risk Score    Flowsheet Row ED to Hosp-Admission (Current) from 08/04/2023 [facility]  30 Day Unplanned Readmission Risk Score (%) 44.7 Filed at 08/10/2023 0801       This score is the patient's risk of an unplanned readmission within 30 days of being discharged (0 -100%). The score is based on dignosis, age, lab data, medications, orders, and past utilization.   Low:  0-14.9   Medium: 15-21.9   High: 22-29.9   Extreme: 30 and above          Pain control - [place] Controlled Substance Reporting System database was reviewed. and patient was instructed, not to drive, operate heavy machinery, perform activities at heights, swimming or participation in water activities or provide baby-sitting services while on Pain, Sleep and Anxiety Medications; until their outpatient Physician has advised to do so again. Also recommended to not to take more than prescribed Pain, Sleep and Anxiety Medications.    Disposition: Home Diet recommendation:  Regular diet DISCHARGE MEDICATION: Allergies as of 08/10/2023   No Known Allergies      Medication List     STOP taking these medications    pantoprazole 40 MG tablet Commonly known as: PROTONIX       TAKE these medications    acetaminophen 500 MG tablet Commonly known as: TYLENOL Take 500 mg by mouth every 6 (six) hours as needed for mild pain or fever.   albuterol 108 (90 Base) MCG/ACT  inhaler Commonly known as: VENTOLIN HFA Inhale 2 puffs into the lungs every 6 (six) hours as needed for wheezing or shortness of breath. What changed: when to take this   Breo Ellipta 100-25 MCG/ACT Aepb Generic drug: fluticasone furoate-vilanterol Inhale 1 puff into the lungs daily.   budesonide-formoterol 80-4.5 MCG/ACT inhaler Commonly known as: SYMBICORT Inhale 1 puff into the lungs 2 (two) times daily.   cyanocobalamin 100 MCG tablet Take 100 mcg by mouth daily.   enoxaparin 30 MG/0.3ML injection Commonly known as: LOVENOX Inject 0.3 mLs (30 mg total) into the skin every 12 (twelve) hours.   FeroSul 325 (65 Fe) MG tablet Generic drug: ferrous sulfate Take 325 mg by mouth daily with breakfast.   furosemide 20 MG tablet Commonly known as: LASIX Take 20 mg by mouth daily.   multivitamin with minerals Tabs tablet Take 1 tablet by mouth daily.   rosuvastatin 10 MG tablet Commonly known as: CRESTOR TAKE 1 TABLET(10 MG) BY MOUTH DAILY   simethicone 80 MG chewable tablet Commonly known as: MYLICON Chew 1 tablet (80 mg total) by mouth every 6 (six) hours as needed for flatulence.   trimethoprim 100 MG tablet Commonly known as: TRIMPEX Take 1  tablet (100 mg total) by mouth at bedtime.   vitamin C 1000 MG tablet Take 1,000 mg by mouth daily.        Discharge Exam:     Subjective: Continues to feel better.  Able to stand and walk 15 feet with PT today and felt better from a SOB standpoint.   Objective: Vitals:   08/10/23 0227 08/10/23 0741  BP: (!) 110/47 (!) 106/56  Pulse: 74 72  Resp: 19 16  Temp: 97.9 F (36.6 C) 98.6 F (37 C)  SpO2: 98% 98%    Intake/Output Summary (Last 24 hours) at 08/10/2023 1412 Last data filed at 08/10/2023 1200 Gross per 24 hour  Intake --  Output 1900 ml  Net -1900 ml   Filed Weights   08/04/23 1542  Weight: 100.8 kg    Exam:  General:  Appears calm and comfortable and is in NAD Eyes:   EOMI, normal lids,  iris ENT:  grossly normal hearing, lips & tongue, mmm Neck:  no LAD, masses or thyromegaly Cardiovascular:  RRR, no m/r/g. No LE edema.  Respiratory:   CTA bilaterally with no wheezes/rales/rhonchi.  Normal respiratory effort. Abdomen:  soft, NT, ND; L nephrostomy tube with clear yellow urine Skin:  no rash or induration seen on limited exam Musculoskeletal:  grossly normal tone BUE/BLE, good ROM, no bony abnormality Psychiatric:  grossly normal mood and affect, speech fluent and appropriate, AOx3 Neurologic:  CN 2-12 grossly intact, moves all extremities in coordinated fashion  Data Reviewed: I have reviewed the patient's lab results since admission.  Pertinent labs for today include:  Unremarkable BMP Hgb 8.8, 9.0 on 2/1     Condition at discharge: improving  The results of significant diagnostics from this hospitalization (including imaging, microbiology, ancillary and laboratory) are listed below for reference.   Imaging Studies: US Venous Img Lower Bilateral (DVT) Result Date: 08/05/2023 CLINICAL DATA:  Bilateral lower extremity pain and edema. History of cervical cancer and previous DVT. Evaluate for acute or chronic DVT. EXAM: BILATERAL LOWER EXTREMITY VENOUS DOPPLER ULTRASOUND TECHNIQUE: Gray-scale sonography with graded compression, as well as color Doppler and duplex ultrasound were performed to evaluate the lower extremity deep venous systems from the level of the common femoral vein and including the common femoral, femoral, profunda femoral, popliteal and calf veins including the posterior tibial, peroneal and gastrocnemius veins when visible. The superficial great saphenous vein was also interrogated. Spectral Doppler was utilized to evaluate flow at rest and with distal augmentation maneuvers in the common femoral, femoral and popliteal veins. COMPARISON:  Bilateral lower extremity venous Doppler ultrasound-02/24/2022 FINDINGS: RIGHT LOWER EXTREMITY Common Femoral Vein: There  is mixed echogenic nonocclusive wall thickening/DVT involving right common femoral vein (image 31 and 32). Saphenofemoral Junction: No evidence of thrombus. Normal compressibility and flow on color Doppler imaging. Profunda Femoral Vein: No evidence of thrombus. Normal compressibility and flow on color Doppler imaging. Femoral Vein: The right femoral vein appears atretic with mixed echogenic predominantly occlusive wall thickening/chronic DVT involving the proximal (images 42 and 43 mid (images 45 and 46 and distal (images 48 and 49) aspects of the femoral vein, similar to the 02/2022 examination. Popliteal Vein: There is mixed echogenic nonocclusive wall thickening involving the right popliteal vein (images 51 and 52), similar to the 02/2022 examination. Calf Veins: No evidence of thrombus. Normal compressibility and flow on color Doppler imaging. Superficial Great Saphenous Vein: No evidence of thrombus. Normal compressibility. Other Findings:  None. LEFT LOWER EXTREMITY Common Femoral Vein: No evidence  of thrombus. Normal compressibility, respiratory phasicity and response to augmentation. Saphenofemoral Junction: No evidence of thrombus. Normal compressibility and flow on color Doppler imaging. Profunda Femoral Vein: No evidence of thrombus. Normal compressibility and flow on color Doppler imaging. Femoral Vein: No evidence of thrombus. Normal compressibility, respiratory phasicity and response to augmentation. Popliteal Vein: No evidence of thrombus. Normal compressibility, respiratory phasicity and response to augmentation. Calf Veins: No evidence of thrombus. Normal compressibility and flow on color Doppler imaging. Superficial Great Saphenous Vein: No evidence of thrombus. Normal compressibility. Other Findings:  None. IMPRESSION: 1. Chronic mixed occlusive and nonocclusive DVT extending from the right common femoral vein through the right popliteal vein, similar to the 02/2022 examination. 2. No evidence of  acute DVT within the right lower extremity. 3. No evidence of acute or chronic DVT within the left lower extremity. Electronically Signed   By: Simonne Come M.D.   On: 08/05/2023 16:12   CT Angio Chest PE W and/or Wo Contrast Result Date: 08/04/2023 CLINICAL DATA:  High probability for PE.  Shortness of breath. EXAM: CT ANGIOGRAPHY CHEST WITH CONTRAST TECHNIQUE: Multidetector CT imaging of the chest was performed using the standard protocol during bolus administration of intravenous contrast. Multiplanar CT image reconstructions and MIPs were obtained to evaluate the vascular anatomy. RADIATION DOSE REDUCTION: This exam was performed according to the departmental dose-optimization program which includes automated exposure control, adjustment of the mA and/or kV according to patient size and/or use of iterative reconstruction technique. CONTRAST:  75mL OMNIPAQUE IOHEXOL 350 MG/ML SOLN COMPARISON:  CT angiogram chest 07/24/2023. FINDINGS: Cardiovascular: Small segmental right lower lobe pulmonary emboli are seen. Heart is mildly enlarged. Aorta is normal in size. Mediastinum/Nodes: No enlarged mediastinal, hilar, or axillary lymph nodes. Thyroid gland, trachea, and esophagus demonstrate no significant findings. Lungs/Pleura: There are areas of air trapping throughout both lungs similar to the prior study. There is atelectasis in the bilateral lower lobes and lingula. There is no pleural effusion or pneumothorax. Trachea and central airways are patent. Upper Abdomen: No acute abnormality. Musculoskeletal: No chest wall abnormality. No acute or significant osseous findings. Review of the MIP images confirms the above findings. IMPRESSION: 1. Small segmental right lower lobe pulmonary emboli. No right heart strain. 2. Bilateral lower lobe and lingular atelectasis. 3. Stable areas of air trapping throughout both lungs. 4. Mild cardiomegaly. Electronically Signed   By: Darliss Cheney M.D.   On: 08/04/2023 21:29   DG  Chest 2 View Result Date: 08/04/2023 CLINICAL DATA:  Shortness of breath. EXAM: CHEST - 2 VIEW COMPARISON:  None Available. FINDINGS: Low lung volume. Bilateral lung fields are clear. Bilateral costophrenic angles are clear. Elevated right hemidiaphragm noted. Normal cardio-mediastinal silhouette. No acute osseous abnormalities. The soft tissues are within normal limits. Partially seen presumed IVC filter. IMPRESSION: No active cardiopulmonary disease. Electronically Signed   By: Jules Schick M.D.   On: 08/04/2023 16:30   DG Chest Port 1 View Result Date: 07/28/2023 CLINICAL DATA:  Dyspnea, chest pain. History of congestive heart failure and diabetes. EXAM: PORTABLE CHEST 1 VIEW COMPARISON:  Radiographs 07/23/2023 and 06/18/2023.  CT 07/24/2023. FINDINGS: 1453 hours. Persistent low lung volumes. The heart size and mediastinal contours are stable with aortic atherosclerosis. Unchanged patchy ground-glass opacities in both lungs which may be postinflammatory. No evidence of confluent airspace disease, edema, pleural effusion or pneumothorax. No acute osseous findings are seen. IMPRESSION: Unchanged patchy ground-glass opacities in both lungs which may be postinflammatory. No evidence of acute cardiopulmonary process. Electronically Signed  By: Carey Bullocks M.D.   On: 07/28/2023 15:52   IR NEPHROSTOMY EXCHANGE LEFT Result Date: 07/28/2023 INDICATION: 68 year old female with chronic indwelling left percutaneous nephrostomy tube. She is currently admitted to hospital and it is time for her next nephrostomy tube exchange. Therefore, she presents for nephrostomy tube exchange. EXAM: Nephrostomy tube exchange COMPARISON:  None Available. MEDICATIONS: None. ANESTHESIA/SEDATION: None. CONTRAST:  5 mL Omnipaque 300 - administered into the collecting system(s) FLUOROSCOPY: Radiation Exposure Index (as provided by the fluoroscopic device): 9 mGy Kerma COMPLICATIONS: None PROCEDURE: Informed written consent was  obtained from the patient after a thorough discussion of the procedural risks, benefits and alternatives. All questions were addressed. Maximal Sterile Barrier Technique was utilized including caps, mask, sterile gowns, sterile gloves, sterile drape, hand hygiene and skin antiseptic. A timeout was performed prior to the initiation of the procedure. A gentle hand injection of contrast material through the existing tube opacifies the renal pelvis. The tube was transected and removed over a wire. A new 10 [place] all-purpose drainage catheter was advanced over the wire and formed in the renal pelvis. Contrast was injected confirming tube location. Images were obtained and stored for the medical record. IMPRESSION: Successful exchange of 10 French left percutaneous nephrostomy tube. Electronically Signed   By: Malachy Moan M.D.   On: 07/28/2023 13:22   CT Angio Chest PE W and/or Wo Contrast Result Date: 07/24/2023 CLINICAL DATA:  Chest pain, shortness of breath, history of PE. CHF. Left lower quadrant abdominal pain, diarrhea. EXAM: CT ANGIOGRAPHY CHEST CT ABDOMEN AND PELVIS WITH CONTRAST TECHNIQUE: Multidetector CT imaging of the chest was performed using the standard protocol during bolus administration of intravenous contrast. Multiplanar CT image reconstructions and MIPs were obtained to evaluate the vascular anatomy. Multidetector CT imaging of the abdomen and pelvis was performed using the standard protocol during bolus administration of intravenous contrast. RADIATION DOSE REDUCTION: This exam was performed according to the departmental dose-optimization program which includes automated exposure control, adjustment of the mA and/or kV according to patient size and/or use of iterative reconstruction technique. CONTRAST:  OMNIPAQUE IOHEXOL 350 MG/ML SOLN COMPARISON:  CT chest dated 06/18/2023. CT abdomen/pelvis dated 04/07/2023. FINDINGS: CTA CHEST FINDINGS Cardiovascular: Satisfactory opacification  the bilateral pulmonary arteries to the segmental level. No evidence of pulmonary embolism. Although not tailored for evaluation of the thoracic aorta, there is no evidence of thoracic aortic aneurysm or dissection. Mild atherosclerotic calcifications of the aortic arch. Cardiomegaly.  No pericardial effusion. Mediastinum/Nodes: No suspicious mediastinal lymphadenopathy. Visualized thyroid is unremarkable. Lungs/Pleura: Ground-glass opacity/mosaic attenuation in the lungs bilaterally, chronic, suggesting interstitial lung disease such as hypersensitivity pneumonitis. Mild platelike atelectasis in the right lower lobe. Mild atelectasis/scarring in the left upper lobe. No frank interstitial edema. No suspicious pulmonary nodules, noting motion degradation. No pleural effusion or pneumothorax. Musculoskeletal: Degenerative changes of the visualized thoracolumbar spine. Review of the MIP images confirms the above findings. CT ABDOMEN and PELVIS FINDINGS Hepatobiliary: Liver is within normal limits. Gallbladder is unremarkable. No intrahepatic or extrahepatic ductal dilatation. Pancreas: Within normal limits Spleen: Within normal limits. Adrenals/Urinary Tract: Adrenal glands are within normal limits. Right kidney is within normal limits. Mild left renal atrophy. Indwelling percutaneous nephrostomy. No hydronephrosis. Bladder is underdistended with mild perivesical stranding (series 4/image 80). Stomach/Bowel: Stomach is within normal limits. No evidence of bowel obstruction. Normal appendix (series 4/image 65). No colonic wall thickening or inflammatory changes. Vascular/Lymphatic: No evidence of abdominal aortic aneurysm. Atherosclerotic calcifications of the abdominal aorta and branch vessels, although  vessels remain patent. IVC filter. No suspicious abdominopelvic lymphadenopathy. Reproductive: Uterus is within normal limits. No adnexal masses. Other: No abdominopelvic ascites. Mild body wall edema. Musculoskeletal:  Mild degenerative changes of the lumbar spine. Review of the MIP images confirms the above findings. IMPRESSION: No evidence of pulmonary embolism. Cardiomegaly.  No frank interstitial edema. Mild left renal atrophy. Indwelling percutaneous nephrostomy. No hydronephrosis. Mild perivesical stranding, suggesting cystitis. Aortic Atherosclerosis (ICD10-I70.0). Electronically Signed   By: Charline Bills M.D.   On: 07/24/2023 00:27   CT ABDOMEN PELVIS W CONTRAST Result Date: 07/24/2023 CLINICAL DATA:  Chest pain, shortness of breath, history of PE. CHF. Left lower quadrant abdominal pain, diarrhea. EXAM: CT ANGIOGRAPHY CHEST CT ABDOMEN AND PELVIS WITH CONTRAST TECHNIQUE: Multidetector CT imaging of the chest was performed using the standard protocol during bolus administration of intravenous contrast. Multiplanar CT image reconstructions and MIPs were obtained to evaluate the vascular anatomy. Multidetector CT imaging of the abdomen and pelvis was performed using the standard protocol during bolus administration of intravenous contrast. RADIATION DOSE REDUCTION: This exam was performed according to the departmental dose-optimization program which includes automated exposure control, adjustment of the mA and/or kV according to patient size and/or use of iterative reconstruction technique. CONTRAST:  OMNIPAQUE IOHEXOL 350 MG/ML SOLN COMPARISON:  CT chest dated 06/18/2023. CT abdomen/pelvis dated 04/07/2023. FINDINGS: CTA CHEST FINDINGS Cardiovascular: Satisfactory opacification the bilateral pulmonary arteries to the segmental level. No evidence of pulmonary embolism. Although not tailored for evaluation of the thoracic aorta, there is no evidence of thoracic aortic aneurysm or dissection. Mild atherosclerotic calcifications of the aortic arch. Cardiomegaly.  No pericardial effusion. Mediastinum/Nodes: No suspicious mediastinal lymphadenopathy. Visualized thyroid is unremarkable. Lungs/Pleura: Ground-glass  opacity/mosaic attenuation in the lungs bilaterally, chronic, suggesting interstitial lung disease such as hypersensitivity pneumonitis. Mild platelike atelectasis in the right lower lobe. Mild atelectasis/scarring in the left upper lobe. No frank interstitial edema. No suspicious pulmonary nodules, noting motion degradation. No pleural effusion or pneumothorax. Musculoskeletal: Degenerative changes of the visualized thoracolumbar spine. Review of the MIP images confirms the above findings. CT ABDOMEN and PELVIS FINDINGS Hepatobiliary: Liver is within normal limits. Gallbladder is unremarkable. No intrahepatic or extrahepatic ductal dilatation. Pancreas: Within normal limits Spleen: Within normal limits. Adrenals/Urinary Tract: Adrenal glands are within normal limits. Right kidney is within normal limits. Mild left renal atrophy. Indwelling percutaneous nephrostomy. No hydronephrosis. Bladder is underdistended with mild perivesical stranding (series 4/image 80). Stomach/Bowel: Stomach is within normal limits. No evidence of bowel obstruction. Normal appendix (series 4/image 65). No colonic wall thickening or inflammatory changes. Vascular/Lymphatic: No evidence of abdominal aortic aneurysm. Atherosclerotic calcifications of the abdominal aorta and branch vessels, although vessels remain patent. IVC filter. No suspicious abdominopelvic lymphadenopathy. Reproductive: Uterus is within normal limits. No adnexal masses. Other: No abdominopelvic ascites. Mild body wall edema. Musculoskeletal: Mild degenerative changes of the lumbar spine. Review of the MIP images confirms the above findings. IMPRESSION: No evidence of pulmonary embolism. Cardiomegaly.  No frank interstitial edema. Mild left renal atrophy. Indwelling percutaneous nephrostomy. No hydronephrosis. Mild perivesical stranding, suggesting cystitis. Aortic Atherosclerosis (ICD10-I70.0). Electronically Signed   By: Charline Bills M.D.   On: 07/24/2023 00:27    DG Chest Portable 1 View Result Date: 07/23/2023 CLINICAL DATA:  Shortness of breath and chest pain beginning this morning. History of CHF. Recent weight gain. EXAM: PORTABLE CHEST 1 VIEW COMPARISON:  06/18/2023 FINDINGS: Shallow inspiration. Normal heart size and pulmonary vascularity. No focal airspace disease or consolidation in the lungs. No blunting of  costophrenic angles. No pneumothorax. Mediastinal contours appear intact. Calcification of the aorta. Degenerative changes in the spine and shoulders. IMPRESSION: No active disease. Electronically Signed   By: Burman Nieves M.D.   On: 07/23/2023 20:04    Microbiology: Results for orders placed or performed during the hospital encounter of 08/04/23  Resp panel by RT-PCR (RSV, Flu A&B, Covid) Anterior Nasal Swab     Status: None   Collection Time: 08/04/23  8:41 PM   Specimen: Anterior Nasal Swab  Result Value Ref Range Status   SARS Coronavirus 2 by RT PCR NEGATIVE NEGATIVE Final    Comment: (NOTE) SARS-CoV-2 target nucleic acids are NOT DETECTED.  The SARS-CoV-2 RNA is generally detectable in upper respiratory specimens during the acute phase of infection. The lowest concentration of SARS-CoV-2 viral copies this assay can detect is 138 copies/mL. A negative result does not preclude SARS-Cov-2 infection and should not be used as the sole basis for treatment or other patient management decisions. A negative result may occur with  improper specimen collection/handling, submission of specimen other than nasopharyngeal swab, presence of viral mutation(s) within the areas targeted by this assay, and inadequate number of viral copies(<138 copies/mL). A negative result must be combined with clinical observations, patient history, and epidemiological information. The expected result is Negative.  Fact Sheet for Patients:  BloggerCourse.com  Fact Sheet for Healthcare Providers:   SeriousBroker.it  This test is no t yet approved or cleared by the [place] FDA and  has been authorized for detection and/or diagnosis of SARS-CoV-2 by FDA under an Emergency Use Authorization (EUA). This EUA will remain  in effect (meaning this test can be used) for the duration of the COVID-19 declaration under Section 564(b)(1) of the Act, 21 U.S.C.section 360bbb-3(b)(1), unless the authorization is terminated  or revoked sooner.       Influenza A by PCR NEGATIVE NEGATIVE Final   Influenza B by PCR NEGATIVE NEGATIVE Final    Comment: (NOTE) The Xpert Xpress SARS-CoV-2/FLU/RSV plus assay is intended as an aid in the diagnosis of influenza from Nasopharyngeal swab specimens and should not be used as a sole basis for treatment. Nasal washings and aspirates are unacceptable for Xpert Xpress SARS-CoV-2/FLU/RSV testing.  Fact Sheet for Patients: BloggerCourse.com  Fact Sheet for Healthcare Providers: SeriousBroker.it  This test is not yet approved or cleared by the [place] FDA and has been authorized for detection and/or diagnosis of SARS-CoV-2 by FDA under an Emergency Use Authorization (EUA). This EUA will remain in effect (meaning this test can be used) for the duration of the COVID-19 declaration under Section 564(b)(1) of the Act, 21 U.S.C. section 360bbb-3(b)(1), unless the authorization is terminated or revoked.     Resp Syncytial Virus by PCR NEGATIVE NEGATIVE Final    Comment: (NOTE) Fact Sheet for Patients: BloggerCourse.com  Fact Sheet for Healthcare Providers: SeriousBroker.it  This test is not yet approved or cleared by the [place] FDA and has been authorized for detection and/or diagnosis of SARS-CoV-2 by FDA under an Emergency Use Authorization (EUA). This EUA will remain in effect (meaning this test can be used) for  the duration of the COVID-19 declaration under Section 564(b)(1) of the Act, 21 U.S.C. section 360bbb-3(b)(1), unless the authorization is terminated or revoked.  Performed at [facility], [address] [place], [place] [address]     Labs: CBC: Recent Labs  Lab 08/06/23 0529 08/07/23 0637 08/08/23 0525 08/09/23 0427 08/10/23 0502  WBC 4.8 4.2 4.3 4.7 4.7  NEUTROABS  --  2.6 3.1 3.4 3.1  HGB 7.0* 8.5* 9.0* 9.0* 8.8*  HCT 22.6* 27.0* 28.9* 29.2* 28.9*  MCV 91.9 88.8 90.3 91.3 92.0  PLT 272 258 254 244 242   Basic Metabolic Panel: Recent Labs  Lab 08/04/23 1614 08/05/23 0638 08/07/23 0637 08/08/23 0525 08/10/23 0502  NA 139 139 138 141 136  K 3.3* 3.8 3.8 3.8 3.9  CL 104 106 107 108 108  CO2 23 21* 23 25 22   GLUCOSE 117* 215* 101* 106* 108*  BUN 22 22 29* 28* 26*  CREATININE 0.84 0.88 0.89 0.99 0.92  CALCIUM 9.0 8.8* 8.5* 8.3* 8.5*   Liver Function Tests: No results for input(s): "AST", "ALT", "ALKPHOS", "BILITOT", "PROT", "ALBUMIN" in the last 168 hours. CBG: Recent Labs  Lab 08/09/23 1637 08/09/23 2206 08/10/23 0742 08/10/23 0820 08/10/23 1217  GLUCAP 98 105* 106* 89 140*    Discharge time spent: greater than 30 minutes.  Signed: Jonah Blue, MD [facility] 08/10/2023

## 2023-08-10 NOTE — TOC Transition Note (Signed)
Transition of Care [facility]) - Discharge Note   Patient Details  Name: Courtney Mack MRN: [id number] Date of Birth: 18-Apr-1956  [facility] CM/SW Contact:  Liliana Cline, LCSW Phone Number: 08/10/2023, 3:00 PM   Clinical Narrative:    Patient has orders to DC home today. CSW notified Bjorn Loser with [facility].   Final next level of care: Home w Home Health Services Barriers to Discharge: Barriers Resolved   Patient Goals and CMS Choice Patient states their goals for this hospitalization and ongoing recovery are:: stay out of the hospital CMS Medicare.gov Compare Post Acute Care list provided to:: Patient Choice offered to / list presented to : Patient      Discharge Placement                       Discharge Plan and Services Additional resources added to the After Visit Summary for   In-house Referral: Clinical Social Work   Post Acute Care Choice: Home Health                    HH Arranged: PT, OT [facility] Agency: [facility] Date [facility] Agency Contacted: 08/10/23   Representative spoke with at [facility] Agency: Bjorn Loser  Social Drivers of Health (SDOH) Interventions SDOH Screenings   Food Insecurity: No Food Insecurity (08/05/2023)  Housing: Low Risk  (08/05/2023)  Transportation Needs: No Transportation Needs (08/05/2023)  Utilities: Not At Risk (08/05/2023)  Depression (PHQ2-9): Low Risk  (04/11/2022)  Social Connections: Unknown (08/05/2023)  Tobacco Use: Low Risk  (08/05/2023)     Readmission Risk Interventions    08/07/2023   10:26 AM 07/24/2023   11:14 AM 05/29/2023    9:42 AM  Readmission Risk Prevention Plan  Transportation Screening  Complete   Medication Review (RN Care Manager) Complete Complete   PCP or Specialist appointment within 3-5 days of discharge Complete Complete   HRI or Home Care Consult   Patient refused  SW Recovery Care/Counseling Consult Complete Complete Complete  Palliative Care Screening Not Applicable Not  Applicable Not Applicable  Skilled Nursing Facility Not Applicable Not Applicable Not Applicable

## 2023-08-10 NOTE — Progress Notes (Signed)
Physical Therapy Treatment Patient Details Name: Courtney Mack MRN: [id number] DOB: 1956-02-12 Today's Date: 08/10/2023   History of Present Illness Patient is a 68 year old female with acute pulmonary embolism. 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 Recurrent UTI. History of GAVE syndrome, recurrent VTE s/p IVC filter, CHF, COPD, left-sided hydronephrosis s/p nephrostomy tube placement, DM, HTN, and HLD.    PT Comments  Pt A&Ox4, reported no pain. Able to perform bed mobility with supervision, use of bed rails (normally sleeps in a recliner). Sit <> stand with RW from EOB, BSC and EOB again, CGA with RW. Pt requested somewhat elevated surface. Able to perform pericare in standing with change brief, but maintained at least unilateral support on RW. After a seated rest break she ambulated ~31ft, CGA. Did endorse SOB and spO2 >90%, HR in 130s. Pt up in chair with needs in reach. The patient would benefit from further skilled PT intervention to continue to progress towards goals.     If plan is discharge home, recommend the following: Assist for transportation;Help with stairs or ramp for entrance;Assistance with cooking/housework;A little help with bathing/dressing/bathroom   Can travel by private vehicle        Equipment Recommendations  None recommended by PT    Recommendations for Other Services       Precautions / Restrictions Precautions Precautions: Fall Precaution Comments: LLQ nephrostomy tube Restrictions Weight Bearing Restrictions Per Provider Order: No     Mobility  Bed Mobility Overal bed mobility: Needs Assistance Bed Mobility: Supine to Sit     Supine to sit: Supervision          Transfers Overall transfer level: Needs assistance Equipment used: Rolling walker (2 wheels) Transfers: Sit to/from Stand Sit to Stand: Contact guard assist           General transfer comment: CGA for safety with STS from EOB at elevated  height to RW; sp02 >90%    Ambulation/Gait Ambulation/Gait assistance: Contact guard assist Gait Distance (Feet): 15 Feet Assistive device: Rolling walker (2 wheels)         General Gait Details: no LOB but did get a bit SOB and declined further ambulation at this time   Stairs             Wheelchair Mobility     Tilt Bed    Modified Rankin (Stroke Patients Only)       Balance Overall balance assessment: Needs assistance Sitting-balance support: Feet supported Sitting balance-Leahy Scale: Fair     Standing balance support: Reliant on assistive device for balance, Single extremity supported, Bilateral upper extremity supported Standing balance-Leahy Scale: Good Standing balance comment: able to stand for pericare and pull on brief with CGA                            Cognition Arousal: Alert Behavior During Therapy: WFL for tasks assessed/performed Overall Cognitive Status: Within Functional Limits for tasks assessed                                          Exercises      General Comments        Pertinent Vitals/Pain Pain Assessment Pain Assessment: No/denies pain    Home Living  Prior Function            PT Goals (current goals can now be found in the care plan section) Progress towards PT goals: Progressing toward goals    Frequency    Min 1X/week      PT Plan      Co-evaluation              AM-PAC PT "6 Clicks" Mobility   Outcome Measure  Help needed turning from your back to your side while in a flat bed without using bedrails?: A Little Help needed moving from lying on your back to sitting on the side of a flat bed without using bedrails?: A Little Help needed moving to and from a bed to a chair (including a wheelchair)?: A Little Help needed standing up from a chair using your arms (e.g., wheelchair or bedside chair)?: None Help needed to walk in hospital  room?: A Little Help needed climbing 3-5 steps with a railing? : A Little 6 Click Score: 19    End of Session Equipment Utilized During Treatment: Gait belt Activity Tolerance: Patient limited by fatigue Patient left: with call bell/phone within reach;in chair Nurse Communication: Mobility status PT Visit Diagnosis: Muscle weakness (generalized) (M62.81);Unsteadiness on feet (R26.81)     Time: 1610-9604 PT Time Calculation (min) (ACUTE ONLY): 18 min  Charges:    $Therapeutic Activity: 8-22 mins PT General Charges $$ ACUTE PT VISIT: 1 Visit                     Olga Coaster PT, DPT 1:40 PM,08/10/23

## 2023-08-10 NOTE — Plan of Care (Signed)
  Problem: Education: Goal: Knowledge of General Education information will improve Description: Including pain rating scale, medication(s)/side effects and non-pharmacologic comfort measures Outcome: Progressing   Problem: Health Behavior/Discharge Planning: Goal: Ability to manage health-related needs will improve Outcome: Progressing   Problem: Clinical Measurements: Goal: Will remain free from infection Outcome: Progressing Goal: Diagnostic test results will improve Outcome: Progressing Goal: Respiratory complications will improve Outcome: Progressing   Problem: Activity: Goal: Risk for activity intolerance will decrease Outcome: Progressing   Problem: Nutrition: Goal: Adequate nutrition will be maintained Outcome: Progressing   Problem: Elimination: Goal: Will not experience complications related to bowel motility Outcome: Progressing   Problem: Pain Managment: Goal: General experience of comfort will improve and/or be controlled Outcome: Progressing   Problem: Safety: Goal: Ability to remain free from injury will improve Outcome: Progressing   Problem: Skin Integrity: Goal: Risk for impaired skin integrity will decrease Outcome: Progressing   Problem: Coping: Goal: Ability to adjust to condition or change in health will improve Outcome: Progressing   Problem: Metabolic: Goal: Ability to maintain appropriate glucose levels will improve Outcome: Progressing   Problem: Nutritional: Goal: Maintenance of adequate nutrition will improve Outcome: Progressing   Problem: Skin Integrity: Goal: Risk for impaired skin integrity will decrease Outcome: Progressing

## 2023-08-11 ENCOUNTER — Telehealth: Payer: Self-pay | Admitting: Oncology

## 2023-08-11 ENCOUNTER — Encounter: Payer: Self-pay | Admitting: Oncology

## 2023-08-11 NOTE — Telephone Encounter (Signed)
Pt called and stated that she has recently been in the hospital and needs to scheduled iron infusions with Dr. Cathie Hoops. Please advise on scheduling

## 2023-08-11 NOTE — Telephone Encounter (Signed)
Please schedule MD/ Venofer

## 2023-08-13 ENCOUNTER — Encounter: Payer: Self-pay | Admitting: Oncology

## 2023-08-13 ENCOUNTER — Telehealth: Payer: Self-pay | Admitting: Oncology

## 2023-08-13 NOTE — Telephone Encounter (Signed)
 [facility] called and stated that the pt declined their services and wanted Dr.Yu to know.

## 2023-08-18 ENCOUNTER — Encounter: Payer: Self-pay | Admitting: Oncology

## 2023-08-20 ENCOUNTER — Inpatient Hospital Stay: Payer: 59

## 2023-08-20 ENCOUNTER — Inpatient Hospital Stay: Payer: 59 | Attending: Oncology

## 2023-08-21 ENCOUNTER — Other Ambulatory Visit: Payer: 59

## 2023-08-25 ENCOUNTER — Inpatient Hospital Stay: Payer: 59 | Attending: Oncology

## 2023-08-27 ENCOUNTER — Inpatient Hospital Stay
Admission: EM | Admit: 2023-08-27 | Discharge: 2023-09-02 | DRG: 660 | Disposition: A | Discharge status: Home / Self Care | Payer: 59 | Attending: Internal Medicine | Admitting: Internal Medicine

## 2023-08-27 ENCOUNTER — Emergency Department: Payer: 59

## 2023-08-27 ENCOUNTER — Other Ambulatory Visit: Payer: Self-pay

## 2023-08-27 DIAGNOSIS — T83512A Infection and inflammatory reaction due to nephrostomy catheter, initial encounter: Secondary | ICD-10-CM | POA: Diagnosis present

## 2023-08-27 DIAGNOSIS — Z87440 Personal history of urinary (tract) infections: Secondary | ICD-10-CM | POA: Diagnosis not present

## 2023-08-27 DIAGNOSIS — B9620 Unspecified Escherichia coli [E. coli] as the cause of diseases classified elsewhere: Secondary | ICD-10-CM | POA: Diagnosis present

## 2023-08-27 DIAGNOSIS — I5032 Chronic diastolic (congestive) heart failure: Secondary | ICD-10-CM | POA: Diagnosis present

## 2023-08-27 DIAGNOSIS — Z6841 Body Mass Index (BMI) 40.0 and over, adult: Secondary | ICD-10-CM | POA: Diagnosis not present

## 2023-08-27 DIAGNOSIS — I110 Hypertensive heart disease with heart failure: Secondary | ICD-10-CM | POA: Diagnosis present

## 2023-08-27 DIAGNOSIS — E7800 Pure hypercholesterolemia, unspecified: Secondary | ICD-10-CM | POA: Diagnosis present

## 2023-08-27 DIAGNOSIS — Y732 Prosthetic and other implants, materials and accessory gastroenterology and urology devices associated with adverse incidents: Secondary | ICD-10-CM | POA: Diagnosis present

## 2023-08-27 DIAGNOSIS — J449 Chronic obstructive pulmonary disease, unspecified: Secondary | ICD-10-CM | POA: Diagnosis present

## 2023-08-27 DIAGNOSIS — Z86718 Personal history of other venous thrombosis and embolism: Secondary | ICD-10-CM

## 2023-08-27 DIAGNOSIS — Z7951 Long term (current) use of inhaled steroids: Secondary | ICD-10-CM

## 2023-08-27 DIAGNOSIS — Z8542 Personal history of malignant neoplasm of other parts of uterus: Secondary | ICD-10-CM

## 2023-08-27 DIAGNOSIS — E119 Type 2 diabetes mellitus without complications: Secondary | ICD-10-CM | POA: Diagnosis present

## 2023-08-27 DIAGNOSIS — E785 Hyperlipidemia, unspecified: Secondary | ICD-10-CM | POA: Diagnosis present

## 2023-08-27 DIAGNOSIS — I10 Essential (primary) hypertension: Secondary | ICD-10-CM | POA: Diagnosis not present

## 2023-08-27 DIAGNOSIS — Z1612 Extended spectrum beta lactamase (ESBL) resistance: Secondary | ICD-10-CM | POA: Diagnosis present

## 2023-08-27 DIAGNOSIS — N390 Urinary tract infection, site not specified: Principal | ICD-10-CM | POA: Diagnosis present

## 2023-08-27 DIAGNOSIS — E66813 Obesity, class 3: Secondary | ICD-10-CM | POA: Diagnosis present

## 2023-08-27 DIAGNOSIS — Z7901 Long term (current) use of anticoagulants: Secondary | ICD-10-CM | POA: Diagnosis not present

## 2023-08-27 DIAGNOSIS — Z8249 Family history of ischemic heart disease and other diseases of the circulatory system: Secondary | ICD-10-CM

## 2023-08-27 DIAGNOSIS — Z936 Other artificial openings of urinary tract status: Secondary | ICD-10-CM

## 2023-08-27 DIAGNOSIS — Z79899 Other long term (current) drug therapy: Secondary | ICD-10-CM

## 2023-08-27 DIAGNOSIS — Z83438 Family history of other disorder of lipoprotein metabolism and other lipidemia: Secondary | ICD-10-CM | POA: Diagnosis not present

## 2023-08-27 DIAGNOSIS — Z803 Family history of malignant neoplasm of breast: Secondary | ICD-10-CM | POA: Diagnosis not present

## 2023-08-27 DIAGNOSIS — Z833 Family history of diabetes mellitus: Secondary | ICD-10-CM

## 2023-08-27 DIAGNOSIS — Z86711 Personal history of pulmonary embolism: Secondary | ICD-10-CM | POA: Diagnosis present

## 2023-08-27 DIAGNOSIS — J441 Chronic obstructive pulmonary disease with (acute) exacerbation: Secondary | ICD-10-CM | POA: Insufficient documentation

## 2023-08-27 LAB — CBC WITH DIFFERENTIAL/PLATELET
Abs Immature Granulocytes: 0.01 10*3/uL (ref 0.00–0.07)
Basophils Absolute: 0 10*3/uL (ref 0.0–0.1)
Basophils Relative: 1 %
Eosinophils Absolute: 0.1 10*3/uL (ref 0.0–0.5)
Eosinophils Relative: 3 %
HCT: 33.4 % — ABNORMAL LOW (ref 36.0–46.0)
Hemoglobin: 10.3 g/dL — ABNORMAL LOW (ref 12.0–15.0)
Immature Granulocytes: 0 %
Lymphocytes Relative: 20 %
Lymphs Abs: 0.7 10*3/uL (ref 0.7–4.0)
MCH: 28.4 pg (ref 26.0–34.0)
MCHC: 30.8 g/dL (ref 30.0–36.0)
MCV: 92 fL (ref 80.0–100.0)
Monocytes Absolute: 0.2 10*3/uL (ref 0.1–1.0)
Monocytes Relative: 7 %
Neutro Abs: 2.3 10*3/uL (ref 1.7–7.7)
Neutrophils Relative %: 69 %
Platelets: 227 10*3/uL (ref 150–400)
RBC: 3.63 MIL/uL — ABNORMAL LOW (ref 3.87–5.11)
RDW: 16.5 % — ABNORMAL HIGH (ref 11.5–15.5)
WBC: 3.3 10*3/uL — ABNORMAL LOW (ref 4.0–10.5)
nRBC: 0.6 % — ABNORMAL HIGH (ref 0.0–0.2)

## 2023-08-27 LAB — BASIC METABOLIC PANEL WITH GFR
Anion gap: 10 (ref 5–15)
BUN: 20 mg/dL (ref 8–23)
CO2: 23 mmol/L (ref 22–32)
Calcium: 9.1 mg/dL (ref 8.9–10.3)
Chloride: 106 mmol/L (ref 98–111)
Creatinine, Ser: 0.97 mg/dL (ref 0.44–1.00)
GFR, Estimated: >60 mL/min
Glucose, Bld: 152 mg/dL — ABNORMAL HIGH (ref 70–99)
Potassium: 3.7 mmol/L (ref 3.5–5.1)
Sodium: 139 mmol/L (ref 135–145)

## 2023-08-27 LAB — URINALYSIS, ROUTINE W REFLEX MICROSCOPIC
Bilirubin Urine: NEGATIVE
Glucose, UA: NEGATIVE mg/dL
Hgb urine dipstick: NEGATIVE
Ketones, ur: NEGATIVE mg/dL
Nitrite: NEGATIVE
Protein, ur: 100 mg/dL — AB
Specific Gravity, Urine: 1.016 (ref 1.005–1.030)
WBC, UA: >50 WBC/hpf (ref 0–5)
pH: 5 (ref 5.0–8.0)

## 2023-08-27 LAB — RESP PANEL BY RT-PCR (RSV, FLU A&B, COVID)  RVPGX2
Influenza A by PCR: NEGATIVE
Influenza B by PCR: NEGATIVE
Resp Syncytial Virus by PCR: NEGATIVE
SARS Coronavirus 2 by RT PCR: NEGATIVE

## 2023-08-27 LAB — TROPONIN I (HIGH SENSITIVITY)
Troponin I (High Sensitivity): 13 ng/L
Troponin I (High Sensitivity): 12 ng/L (ref <18)

## 2023-08-27 LAB — BRAIN NATRIURETIC PEPTIDE: B Natriuretic Peptide: 59.2 pg/mL (ref 0.0–100.0)

## 2023-08-27 MED ORDER — ENOXAPARIN SODIUM 60 MG/0.6ML IJ SOSY: 50.0000 mg | PREFILLED_SYRINGE | INTRAMUSCULAR | Status: DC

## 2023-08-27 MED ORDER — DM-GUAIFENESIN ER 30-600 MG PO TB12
1.0000 | ORAL_TABLET | Freq: Two times a day (BID) | ORAL | Status: DC | PRN
  Administered 2023-08-28: 1 via ORAL
  Filled 2023-08-27: qty 1

## 2023-08-27 MED ORDER — IPRATROPIUM-ALBUTEROL 0.5-2.5 (3) MG/3ML IN SOLN
3.0000 mL | Freq: Once | RESPIRATORY_TRACT | Status: AC
  Administered 2023-08-27: 3 mL via RESPIRATORY_TRACT
  Filled 2023-08-27: qty 3

## 2023-08-27 MED ORDER — ACETAMINOPHEN 325 MG PO TABS
650.0000 mg | ORAL_TABLET | Freq: Four times a day (QID) | ORAL | Status: DC | PRN
  Administered 2023-08-28 – 2023-09-01 (×7): 650 mg via ORAL
  Filled 2023-08-27 (×8): qty 2

## 2023-08-27 MED ORDER — ONDANSETRON HCL 4 MG/2ML IJ SOLN: 4.0000 mg | Freq: Three times a day (TID) | INTRAMUSCULAR | Status: DC | PRN

## 2023-08-27 MED ORDER — ALBUTEROL SULFATE HFA 108 (90 BASE) MCG/ACT IN AERS: 2.0000 | INHALATION_SPRAY | RESPIRATORY_TRACT | Status: DC | PRN

## 2023-08-27 MED ORDER — IPRATROPIUM-ALBUTEROL 0.5-2.5 (3) MG/3ML IN SOLN
3.0000 mL | RESPIRATORY_TRACT | Status: DC
  Administered 2023-08-28 – 2023-08-29 (×11): 3 mL via RESPIRATORY_TRACT
  Filled 2023-08-27 (×11): qty 3

## 2023-08-27 MED ORDER — SODIUM CHLORIDE 0.9 % IV SOLN
1.0000 g | Freq: Once | INTRAVENOUS | Status: AC
  Administered 2023-08-27: 1 g via INTRAVENOUS
  Filled 2023-08-27: qty 10

## 2023-08-27 NOTE — H&P (Signed)
History and Physical    ALLURE GREASER ZOX:[id number] DOB: [birth year] DOA: 08/27/2023  Referring MD/NP/PA:   PCP: [facility]   Patient coming from:  The patient is coming from home.     Chief Complaint: Left flank pain, and burning on urination  HPI: Courtney Mack is a 68 y.o. female with medical history significant of left-sided hydronephrosis status post nephrostomy tube placement, recurrent UTI and sepsis due to UTI, hypertension, COPD, history of extensive bilateral PE and DVT (s/p of IVC filter, on lovenox), dCHF, who presents with left flank pain, burning with urination.  Patient states that she started having left flank pain and burning on urination since yesterday.  The pain is constant, aching, moderate, nonradiating.  He also has dark urine.  No fever or chills.  Patient has mild SOB, no cough, chest pain, fever or chills.  She states she has 2 diarrhea yesterday, which is resolved.  Currently no diarrhea.  No nausea, vomiting, abdominal pain.   Data reviewed independently and ED Course: pt was found to have WBC 3.3, UA (yellow appearance, large amount of leukocyte, few bacteria, WBC > 50), GFR > 60, negative PCR for COVID, flu and RSV, temperature normal, blood pressure 94/59, 119/64, heart rate 82, RR 26, oxygen saturation 96% on 2 L oxygen.  Chest x-ray negative.  Patient is admitted to PCU as inpatient.   EKG: I have personally reviewed.  Sinus rhythm, QTc 448, low voltage.   Review of Systems:   General: no fevers, chills, no body weight gain, has poor appetite, has fatigue HEENT: no blurry vision, hearing changes or sore throat Respiratory: has dyspnea, no coughing, wheezing CV: no chest pain, no palpitations GI: no nausea, vomiting, abdominal pain, has diarrhea, no constipation GU: no dysuria, has burning on urination, no increased urinary frequency, hematuria  Ext: has trace leg edema Neuro: no unilateral weakness, numbness, or  tingling, no vision change or hearing loss Skin: no rash, no skin tear. MSK: No muscle spasm, no deformity, no limitation of range of movement in spin Heme: No easy bruising.  Travel history: No recent long distant travel.   Allergy: No Known Allergies  Past Medical History:  Diagnosis Date   Acute bilateral deep vein thrombosis (DVT) of femoral veins (HCC) 01/02/2021   Acute massive pulmonary embolism (HCC) 08/25/2020   Last Assessment & Plan:  Formatting of this note might be different from the original. Post vena caval filter and on xarelto and O2   Anemia    ARF (acute respiratory failure) (HCC)    Cellulitis of left lower leg 06/20/2021   CHF (congestive heart failure) (HCC)    COVID-19    Diabetes mellitus without complication (HCC)    DVT (deep venous thrombosis) (HCC) 09/06/2020   Endometrial cancer (HCC)    GAVE (gastric antral vascular ectasia)    GERD (gastroesophageal reflux disease)    High cholesterol    Hx of blood clots    Hyperlipidemia    Hypertension    IDA (iron deficiency anemia) 09/21/2020   Obesity    Pneumonia 06/11/2022   recovered   Pressure injury of skin 06/21/2021   Pulmonary embolism (HCC)    Sepsis (HCC)    Thrombocytopenia (HCC)    Urinary tract infection 09/13/2021    Past Surgical History:  Procedure Laterality Date   CATARACT EXTRACTION W/PHACO Right 06/04/2022   Procedure: CATARACT EXTRACTION PHACO AND INTRAOCULAR LENS PLACEMENT (IOC) COMPLICATED RIGHT  31.83  02:17.4;  Surgeon:  Galen Manila, MD;  Location: [facility];  Service: Ophthalmology;  Laterality: Right;   CATARACT EXTRACTION W/PHACO Left 07/09/2022   Procedure: CATARACT EXTRACTION PHACO AND INTRAOCULAR LENS PLACEMENT (IOC);  Surgeon: Galen Manila, MD;  Location: [facility];  Service: Ophthalmology;  Laterality: Left;  22.75 1:40.3   COLONOSCOPY N/A 06/21/2021   Procedure: COLONOSCOPY;  Surgeon: Toledo, Boykin Nearing, MD;  Location: [facility];   Service: Gastroenterology;  Laterality: N/A;   CYSTOSCOPY W/ URETERAL STENT PLACEMENT Left 09/11/2021   Procedure: CYSTOSCOPY WITH RETROGRADE PYELOGRAM/URETERAL STENT PLACEMENT;  Surgeon: Riki Altes, MD;  Location: [facility];  Service: Urology;  Laterality: Left;   CYSTOSCOPY W/ URETERAL STENT PLACEMENT Left 11/17/2021   Procedure: CYSTOSCOPY WITH STENT REPLACEMENT;  Surgeon: Bjorn Pippin, MD;  Location: [facility];  Service: Urology;  Laterality: Left;   CYSTOSCOPY WITH FULGERATION  11/17/2021   Procedure: CYSTOSCOPY WITH FULGERATION;  Surgeon: Bjorn Pippin, MD;  Location: [facility];  Service: Urology;;   CYSTOSCOPY WITH STENT PLACEMENT Left 10/17/2021   Procedure: CYSTOSCOPY WITH STENT PLACEMENT;  Surgeon: Riki Altes, MD;  Location: [facility];  Service: Urology;  Laterality: Left;   CYSTOSCOPY WITH URETEROSCOPY, STONE BASKETRY AND STENT PLACEMENT Left 10/16/2021   Procedure: CYSTOSCOPY WITH URETEROSCOPY  AND STENT REMOVAL;  Surgeon: Riki Altes, MD;  Location: [facility];  Service: Urology;  Laterality: Left;   ENTEROSCOPY N/A 08/17/2021   Procedure: ENTEROSCOPY;  Surgeon: Toney Reil, MD;  Location: [facility];  Service: Gastroenterology;  Laterality: N/A;   ESOPHAGOGASTRODUODENOSCOPY N/A 06/21/2021   Procedure: ESOPHAGOGASTRODUODENOSCOPY (EGD);  Surgeon: Toledo, Boykin Nearing, MD;  Location: [facility];  Service: Gastroenterology;  Laterality: N/A;   ESOPHAGOGASTRODUODENOSCOPY (EGD) WITH PROPOFOL N/A 08/17/2021   Procedure: ESOPHAGOGASTRODUODENOSCOPY (EGD) WITH PROPOFOL;  Surgeon: Toney Reil, MD;  Location: [facility];  Service: Gastroenterology;  Laterality: N/A;   ESOPHAGOGASTRODUODENOSCOPY (EGD) WITH PROPOFOL N/A 10/14/2021   Procedure: ESOPHAGOGASTRODUODENOSCOPY (EGD) WITH PROPOFOL;  Surgeon: Midge Minium, MD;  Location: [facility];  Service: Endoscopy;  Laterality: N/A;   ESOPHAGOGASTRODUODENOSCOPY (EGD) WITH PROPOFOL N/A 02/01/2022   Procedure:  ESOPHAGOGASTRODUODENOSCOPY (EGD) WITH PROPOFOL;  Surgeon: Toney Reil, MD;  Location: [facility];  Service: Gastroenterology;  Laterality: N/A;   ESOPHAGOGASTRODUODENOSCOPY (EGD) WITH PROPOFOL N/A 03/15/2022   Procedure: ESOPHAGOGASTRODUODENOSCOPY (EGD) WITH PROPOFOL;  Surgeon: Wyline Mood, MD;  Location: [facility];  Service: Gastroenterology;  Laterality: N/A;   ESOPHAGOGASTRODUODENOSCOPY (EGD) WITH PROPOFOL N/A 04/18/2022   Procedure: ESOPHAGOGASTRODUODENOSCOPY (EGD) WITH PROPOFOL;  Surgeon: Toney Reil, MD;  Location: [facility] END[facility]COPY;  Service: Gastroenterology;  Laterality: N/A;   ESOPHAGOGASTRODUODENOSCOPY (EGD) WITH PROPOFOL N/A 08/09/2022   Procedure: ESOPHAGOGASTRODUODENOSCOPY (EGD) WITH PROPOFOL;  Surgeon: Wyline Mood, MD;  Location: [facility] END[facility]COPY;  Service: Gastroenterology;  Laterality: N/A;   GIVENS CAPSULE STUDY N/A 06/22/2021   Procedure: GIVENS CAPSULE STUDY;  Surgeon: Toledo, Boykin Nearing, MD;  Location: [facility];  Service: Gastroenterology;  Laterality: N/A;   IR NEPHROSTOMY EXCHANGE LEFT  02/25/2022   IR NEPHROSTOMY EXCHANGE LEFT  03/27/2022   IR NEPHROSTOMY EXCHANGE LEFT  05/22/2022   IR NEPHROSTOMY EXCHANGE LEFT  07/24/2022   IR NEPHROSTOMY EXCHANGE LEFT  09/24/2022   IR NEPHROSTOMY EXCHANGE LEFT  11/21/2022   IR NEPHROSTOMY EXCHANGE LEFT  02/17/2023   IR NEPHROSTOMY EXCHANGE LEFT  04/06/2023   IR NEPHROSTOMY EXCHANGE LEFT  05/27/2023   IR NEPHROSTOMY EXCHANGE LEFT  07/28/2023   IR NEPHROSTOMY PLACEMENT LEFT  12/27/2021   IVC FILTER INSERTION N/A 08/18/2020   Procedure: IVC FILTER  INSERTION;  Surgeon: Renford Dills, MD;  Location: [facility];  Service: Cardiovascular;  Laterality: N/A;   PERIPHERAL VASCULAR THROMBECTOMY Bilateral 01/03/2021   Procedure: PERIPHERAL VASCULAR THROMBECTOMY;  Surgeon: Annice Needy, MD;  Location: [facility];  Service: Cardiovascular;  Laterality: Bilateral;   PULMONARY THROMBECTOMY N/A 08/18/2020    Procedure: PULMONARY THROMBECTOMY;  Surgeon: Renford Dills, MD;  Location: [facility];  Service: Cardiovascular;  Laterality: N/A;   TUBAL LIGATION     VISCERAL ANGIOGRAPHY N/A 07/18/2021   Procedure: VISCERAL ANGIOGRAPHY;  Surgeon: Annice Needy, MD;  Location: [facility];  Service: Cardiovascular;  Laterality: N/A;    Social History:  reports that she has never smoked. She has never used smokeless tobacco. She reports that she does not currently use alcohol. She reports that she does not use drugs.  Family History:  Family History  Problem Relation Age of Onset   Hypertension Mother    Diabetes Mother    Diabetes Father    Hypertension Father    High Cholesterol Father    Congestive Heart Failure Father    Breast cancer Cousin      Prior to Admission medications   Medication Sig Start Date End Date Taking? Authorizing Provider  loperamide (IMODIUM) 2 MG capsule Take 2 mg by mouth as needed for diarrhea or loose stools. 08/14/23  Yes [provider]  acetaminophen (TYLENOL) 500 MG tablet Take 500 mg by mouth every 6 (six) hours as needed for mild pain or fever.    [provider]  albuterol (VENTOLIN HFA) 108 (90 Base) MCG/ACT inhaler Inhale 2 puffs into the lungs every 6 (six) hours as needed for wheezing or shortness of breath. Patient taking differently: Inhale 2 puffs into the lungs every 4 (four) hours as needed for wheezing or shortness of breath. 10/26/22   Marrion Coy, MD  Ascorbic Acid (VITAMIN C) 1000 MG tablet Take 1,000 mg by mouth daily.    [provider]  budesonide-formoterol (SYMBICORT) 80-4.5 MCG/ACT inhaler Inhale 1 puff into the lungs 2 (two) times daily.    [provider]  cyanocobalamin 100 MCG tablet Take 100 mcg by mouth daily.    [provider]  enoxaparin (LOVENOX) 30 MG/0.3ML injection Inject 0.3 mLs (30 mg total) into the skin every 12 (twelve) hours. 08/10/23   Jonah Blue, MD  FEROSUL  325 (65 Fe) MG tablet Take 325 mg by mouth daily with breakfast. 08/14/22   [provider]  fluticasone furoate-vilanterol (BREO ELLIPTA) 100-25 MCG/ACT AEPB Inhale 1 puff into the lungs daily.    [provider]  furosemide (LASIX) 20 MG tablet Take 20 mg by mouth daily. 04/15/23   [provider]  Multiple Vitamin (MULTIVITAMIN WITH MINERALS) TABS tablet Take 1 tablet by mouth daily. 09/12/20   Standley Brooking, MD  rosuvastatin (CRESTOR) 10 MG tablet TAKE 1 TABLET(10 MG) BY MOUTH DAILY 07/24/23   Delma Freeze, FNP  simethicone (MYLICON) 80 MG chewable tablet Chew 1 tablet (80 mg total) by mouth every 6 (six) hours as needed for flatulence. 04/08/23   Wouk, Wilfred Curtis, MD  trimethoprim (TRIMPEX) 100 MG tablet Take 1 tablet (100 mg total) by mouth at bedtime. 08/10/23   Jonah Blue, MD    Physical Exam: Vitals:   08/28/23 0000 08/28/23 0030 08/28/23 0100 08/28/23 0130  BP: 125/69 (!) 110/55 (!) 122/58 (!) 109/54  Pulse: 79 76 82 86  Resp: (!) 23 20  14 (!) 22  Temp:      TempSrc:      SpO2: 95% 100% 98% 94%  Weight:      Height:       General: Not in acute distress HEENT:       Eyes: PERRL, EOMI, no jaundice       ENT: No discharge from the ears and nose, no pharynx injection, no tonsillar enlargement.        Neck: No JVD, no bruit, no mass felt. Heme: No neck lymph node enlargement. Cardiac: S1/S2, RRR, No murmurs, No gallops or rubs. Respiratory: No rales, wheezing, rhonchi or rubs. GI: Soft, nondistended, nontender, no rebound pain, no organomegaly, BS present. GU: No hematuria Ext: has traced leg edema bilaterally. 1+DP/PT pulse bilaterally. Musculoskeletal: No joint deformities, No joint redness or warmth, no limitation of ROM in spin. Skin: No rashes.  Neuro: Alert, oriented X3, cranial nerves II-XII grossly intact, moves all extremities normally. Psych: Patient is not psychotic, no suicidal or hemocidal ideation.  Labs on Admission: I have  personally reviewed following labs and imaging studies  CBC: Recent Labs  Lab 08/27/23 1820  WBC 3.3*  NEUTROABS 2.3  HGB 10.3*  HCT 33.4*  MCV 92.0  PLT 227   Basic Metabolic Panel: Recent Labs  Lab 08/27/23 1820  NA 139  K 3.7  CL 106  CO2 23  GLUCOSE 152*  BUN 20  CREATININE 0.97  CALCIUM 9.1   GFR: Estimated Creatinine Clearance: 58.3 mL/min (by C-G formula based on SCr of 0.97 mg/dL). Liver Function Tests: No results for input(s): "AST", "ALT", "ALKPHOS", "BILITOT", "PROT", "ALBUMIN" in the last 168 hours. No results for input(s): "LIPASE", "AMYLASE" in the last 168 hours. No results for input(s): "AMMONIA" in the last 168 hours. Coagulation Profile: No results for input(s): "INR", "PROTIME" in the last 168 hours. Cardiac Enzymes: No results for input(s): "CKTOTAL", "CKMB", "CKMBINDEX", "TROPONINI" in the last 168 hours. BNP (last 3 results) No results for input(s): "PROBNP" in the last 8760 hours. HbA1C: No results for input(s): "HGBA1C" in the last 72 hours. CBG: No results for input(s): "GLUCAP" in the last 168 hours. Lipid Profile: No results for input(s): "CHOL", "HDL", "LDLCALC", "TRIG", "CHOLHDL", "LDLDIRECT" in the last 72 hours. Thyroid Function Tests: No results for input(s): "TSH", "T4TOTAL", "FREET4", "T3FREE", "THYROIDAB" in the last 72 hours. Anemia Panel: No results for input(s): "VITAMINB12", "FOLATE", "FERRITIN", "TIBC", "IRON", "RETICCTPCT" in the last 72 hours. Urine analysis:    Component Value Date/Time   COLORURINE YELLOW (A) 08/27/2023 2132   APPEARANCEUR CLOUDY (A) 08/27/2023 2132   APPEARANCEUR Cloudy (A) 01/03/2022 1443   LABSPEC 1.016 08/27/2023 2132   PHURINE 5.0 08/27/2023 2132   GLUCOSEU NEGATIVE 08/27/2023 2132   HGBUR NEGATIVE 08/27/2023 2132   BILIRUBINUR NEGATIVE 08/27/2023 2132   BILIRUBINUR Negative 01/03/2022 1443   KETONESUR NEGATIVE 08/27/2023 2132   PROTEINUR 100 (A) 08/27/2023 2132   NITRITE NEGATIVE  08/27/2023 2132   LEUKOCYTESUR LARGE (A) 08/27/2023 2132   Sepsis Labs: @LABRCNTIP (procalcitonin:4,lacticidven:4) ) Recent Results (from the past 240 hours)  Resp panel by RT-PCR (RSV, Flu A&B, Covid) Anterior Nasal Swab     Status: None   Collection Time: 08/27/23  6:20 PM   Specimen: Anterior Nasal Swab  Result Value Ref Range Status   SARS Coronavirus 2 by RT PCR NEGATIVE NEGATIVE Final    Comment: (NOTE) SARS-CoV-2 target nucleic acids are NOT DETECTED.  The SARS-CoV-2 RNA is generally detectable in upper respiratory specimens during the acute  phase of infection. The lowest concentration of SARS-CoV-2 viral copies this assay can detect is 138 copies/mL. A negative result does not preclude SARS-Cov-2 infection and should not be used as the sole basis for treatment or other patient management decisions. A negative result may occur with  improper specimen collection/handling, submission of specimen other than nasopharyngeal swab, presence of viral mutation(s) within the areas targeted by this assay, and inadequate number of viral copies(<138 copies/mL). A negative result must be combined with clinical observations, patient history, and epidemiological information. The expected result is Negative.  Fact Sheet for Patients:  BloggerCourse.com  Fact Sheet for Healthcare Providers:  SeriousBroker.it  This test is no t yet approved or cleared by the [place] FDA and  has been authorized for detection and/or diagnosis of SARS-CoV-2 by FDA under an Emergency Use Authorization (EUA). This EUA will remain  in effect (meaning this test can be used) for the duration of the COVID-19 declaration under Section 564(b)(1) of the Act, 21 U.S.C.section 360bbb-3(b)(1), unless the authorization is terminated  or revoked sooner.       Influenza A by PCR NEGATIVE NEGATIVE Final   Influenza B by PCR NEGATIVE NEGATIVE Final    Comment:  (NOTE) The Xpert Xpress SARS-CoV-2/FLU/RSV plus assay is intended as an aid in the diagnosis of influenza from Nasopharyngeal swab specimens and should not be used as a sole basis for treatment. Nasal washings and aspirates are unacceptable for Xpert Xpress SARS-CoV-2/FLU/RSV testing.  Fact Sheet for Patients: BloggerCourse.com  Fact Sheet for Healthcare Providers: SeriousBroker.it  This test is not yet approved or cleared by the [place] FDA and has been authorized for detection and/or diagnosis of SARS-CoV-2 by FDA under an Emergency Use Authorization (EUA). This EUA will remain in effect (meaning this test can be used) for the duration of the COVID-19 declaration under Section 564(b)(1) of the Act, 21 U.S.C. section 360bbb-3(b)(1), unless the authorization is terminated or revoked.     Resp Syncytial Virus by PCR NEGATIVE NEGATIVE Final    Comment: (NOTE) Fact Sheet for Patients: BloggerCourse.com  Fact Sheet for Healthcare Providers: SeriousBroker.it  This test is not yet approved or cleared by the [place] FDA and has been authorized for detection and/or diagnosis of SARS-CoV-2 by FDA under an Emergency Use Authorization (EUA). This EUA will remain in effect (meaning this test can be used) for the duration of the COVID-19 declaration under Section 564(b)(1) of the Act, 21 U.S.C. section 360bbb-3(b)(1), unless the authorization is terminated or revoked.  Performed at [facility], [address] [place], [place] [address]      Radiological Exams on Admission:   Assessment/Plan Principal Problem:   Complicated UTI (urinary tract infection) Active Problems:   Nephrostomy status (HCC)   COPD (chronic obstructive pulmonary disease) (HCC)   Chronic diastolic CHF (congestive heart failure) (HCC)   HTN (hypertension)   HLD (hyperlipidemia)    History of pulmonary embolus (PE)   Obesity, Class III, BMI 40-49.9 (morbid obesity) (HCC)   Assessment and Plan:  Complicated UTI (urinary tract infection) in the setting of nephrostomy status: Patient has recurrent complicated UTI, but no fever, does not seem to have sepsis.  Patient has history of positive urine culture for Pseudomonas.  -Admitted to PCU as inpatient -IV cefepime (patient received 1 dose of Rocephin in ED) -Follow-up urine culture   COPD (chronic obstructive pulmonary disease) (HCC): No wheezing or rhonchi on auscultation. -Bronchodilators as needed Mucinex   Chronic diastolic CHF (congestive heart failure) (  HCC): 2D echo: 11/21/2022 showed EF 60 to 65% with grade 1 diastolic dysfunction.  Has trace leg edema, no JVD.  BNP normal 59.2.  CHF is compensated. -Hold Lasix due to softer blood pressure    HTN (hypertension): Patient has softer blood -Hold blood pressure medications now   Dyslipidemia -Crestor   Obesity (BMI 30-39.9): Body weight 1 1.4 kg, BMI 45.14 -Encourage losing weight, -Exercise and healthy diet  History of pulmonary embolus (PE) and DVT -Current continue home Lovenox 30 mg twice daily      DVT ppx: on Lovenox  Code Status: Full code     Family Communication:     not done, no family member is at bed side.   Disposition Plan:  Anticipate discharge back to previous environment  Consults called:  none  Admission status and Level of care: Progressive:   as inpt        Dispo: The patient is from: Home              Anticipated d/c is to: Home              Anticipated d/c date is: 2 days              Patient currently is not medically stable to d/c.    Severity of Illness:  The appropriate patient status for this patient is INPATIENT. Inpatient status is judged to be reasonable and necessary in order to provide the required intensity of service to ensure the patient's safety. The patient's presenting symptoms, physical exam  findings, and initial radiographic and laboratory data in the context of their chronic comorbidities is felt to place them at high risk for further clinical deterioration. Furthermore, it is not anticipated that the patient will be medically stable for discharge from the hospital within 2 midnights of admission.   * I certify that at the point of admission it is my clinical judgment that the patient will require inpatient hospital care spanning beyond 2 midnights from the point of admission due to high intensity of service, high risk for further deterioration and high frequency of surveillance required.*       Date of Service 08/28/2023    Lorretta Harp [facility]   If 7PM-7AM, please contact night-coverage [email or web address] 08/28/2023, 3:04 AM

## 2023-08-27 NOTE — ED Notes (Signed)
 CCMD called and Pt placed on cardiac monitoring.

## 2023-08-27 NOTE — ED Triage Notes (Signed)
Arrives fronm via [facility] Hx: COPD, CHF Got up to go to the [place], and got SOB  used rescue inhaler and doesn't feel it helped  EMS Vitals: 99% RA, BP 140/100, HR 80, no CBG, no interventions  Currently Dx w/a Kidney infxn D/C approx 3 weeks ago from [facility] with the kidney Infx and still taking Abx

## 2023-08-27 NOTE — ED Notes (Signed)
1 set of cultures, lav, green, blue and PCR swab sent to lab with save tube labels.

## 2023-08-27 NOTE — ED Provider Notes (Signed)
[facility] Provider Note    Event Date/Time   First MD Initiated Contact with Patient 08/27/23 1810     (approximate)   History   Back pain   HPI  Courtney Mack is a 68 y.o. female who presents to the emergency department today with primary concern for back pain and a kidney infection.  The patient states she has history of frequent kidney infections.  She says this is typically how she presents.  She gets the back pain and will start developing some shortness of breath with exertion.  The patient has also noticed darkening of her urine.  She has not had any fevers.     Physical Exam   Triage Vital Signs: ED Triage Vitals  Encounter Vitals Group     BP 08/27/23 1809 117/66     Systolic BP Percentile --      Diastolic BP Percentile --      Pulse Rate 08/27/23 1809 80     Resp 08/27/23 1809 17     Temp 08/27/23 1809 98.6 F (37 C)     Temp Source 08/27/23 1809 Oral     SpO2 08/27/23 1808 100 %     Weight 08/27/23 1811 223 lb 8 oz (101.4 kg)     Height 08/27/23 1811 4\' 11"  (1.499 m)     Head Circumference --      Peak Flow --      Pain Score 08/27/23 1811 4     Pain Loc --      Pain Education --      Exclude from Growth Chart --     Most recent vital signs: Vitals:   08/27/23 1809 08/27/23 1810  BP: 117/66   Pulse: 80   Resp: 17   Temp: 98.6 F (37 C)   SpO2: 98% 100%   General: Awake, alert, oriented. CV:  Good peripheral perfusion. Regular rate and rhythm. Resp:  Normal effort. Lungs clear.  Abd:  No distention. Non tender, ostomy bag in place.  ED Results / Procedures / Treatments   Labs (all labs ordered are listed, but only abnormal results are displayed) Labs Reviewed  URINALYSIS, ROUTINE W REFLEX MICROSCOPIC - Abnormal; Notable for the following components:      Result Value   Color, Urine YELLOW (*)    APPearance CLOUDY (*)    Protein, ur 100 (*)    Leukocytes,Ua LARGE (*)    Bacteria, UA FEW (*)    All other  components within normal limits  CBC WITH DIFFERENTIAL/PLATELET - Abnormal; Notable for the following components:   WBC 3.3 (*)    RBC 3.63 (*)    Hemoglobin 10.3 (*)    HCT 33.4 (*)    RDW 16.5 (*)    nRBC 0.6 (*)    All other components within normal limits  BASIC METABOLIC PANEL - Abnormal; Notable for the following components:   Glucose, Bld 152 (*)    All other components within normal limits  RESP PANEL BY RT-PCR (RSV, FLU A&B, COVID)  RVPGX2  BRAIN NATRIURETIC PEPTIDE  TROPONIN I (HIGH SENSITIVITY)  TROPONIN I (HIGH SENSITIVITY)     EKG  I, Phineas Semen, attending physician, personally viewed and interpreted this EKG  EKG Time: 1819 Rate: 79 Rhythm: sinus rhythm Axis: normal Intervals: qtc 448 QRS: narrow, low voltage ST changes: no st elevation Impression: abnormal ekg    RADIOLOGY I independently interpreted and visualized the CXR. My interpretation: No pneumonia Radiology  interpretation:  IMPRESSION:  No acute cardiopulmonary findings.      PROCEDURES:  Critical Care performed: No   MEDICATIONS ORDERED IN ED: Medications - No data to display   IMPRESSION / MDM / ASSESSMENT AND PLAN / ED COURSE  I reviewed the triage vital signs and the nursing notes.                              Differential diagnosis includes, but is not limited to, UTI, MSK pain  Patient's presentation is most consistent with acute presentation with potential threat to life or bodily function.   The patient is on the cardiac monitor to evaluate for evidence of arrhythmia and/or significant heart rate changes.  Patient presented to the emergency department today with concern for possible urinary tract infection.  On exam patient is awake and alert.  Patient is afebrile.  Blood work does show leukopenia.  Urine is concerning for infection.  Chest x-ray without pneumonia.  At this time given concerns for urinary tract infection and complicated UTI will start IV antibiotics.   Discussed with Dr. Clyde Lundborg with the hospitalist service who will evaluate for admission.     FINAL CLINICAL IMPRESSION(S) / ED DIAGNOSES   Final diagnoses:  Lower urinary tract infection       Note:  This document was prepared using Dragon voice recognition software and may include unintentional dictation errors.    Phineas Semen, MD 08/27/23 [phone]

## 2023-08-28 ENCOUNTER — Inpatient Hospital Stay: Payer: 59 | Admitting: Oncology

## 2023-08-28 ENCOUNTER — Inpatient Hospital Stay: Payer: 59

## 2023-08-28 DIAGNOSIS — N390 Urinary tract infection, site not specified: Secondary | ICD-10-CM | POA: Diagnosis not present

## 2023-08-28 LAB — BASIC METABOLIC PANEL
Anion gap: 10 (ref 5–15)
BUN: 21 mg/dL (ref 8–23)
CO2: 23 mmol/L (ref 22–32)
Calcium: 8.6 mg/dL — ABNORMAL LOW (ref 8.9–10.3)
Chloride: 107 mmol/L (ref 98–111)
Creatinine, Ser: 0.95 mg/dL (ref 0.44–1.00)
GFR, Estimated: >60 mL/min (ref >60)
Glucose, Bld: 128 mg/dL — ABNORMAL HIGH (ref 70–99)
Potassium: 3.8 mmol/L (ref 3.5–5.1)
Sodium: 140 mmol/L (ref 135–145)

## 2023-08-28 LAB — CBC
HCT: 30.4 % — ABNORMAL LOW (ref 36.0–46.0)
Hemoglobin: 9.4 g/dL — ABNORMAL LOW (ref 12.0–15.0)
MCH: 28.2 pg (ref 26.0–34.0)
MCHC: 30.9 g/dL (ref 30.0–36.0)
MCV: 91.3 fL (ref 80.0–100.0)
Platelets: 233 10*3/uL (ref 150–400)
RBC: 3.33 MIL/uL — ABNORMAL LOW (ref 3.87–5.11)
RDW: 16.8 % — ABNORMAL HIGH (ref 11.5–15.5)
WBC: 3.6 10*3/uL — ABNORMAL LOW (ref 4.0–10.5)
nRBC: 0 % (ref 0.0–0.2)

## 2023-08-28 MED ORDER — ENOXAPARIN SODIUM 30 MG/0.3ML IJ SOSY
30.0000 mg | PREFILLED_SYRINGE | Freq: Two times a day (BID) | INTRAMUSCULAR | Status: DC
  Administered 2023-08-28 – 2023-09-02 (×11): 30 mg via SUBCUTANEOUS
  Filled 2023-08-28 (×11): qty 0.3

## 2023-08-28 MED ORDER — ROSUVASTATIN CALCIUM 10 MG PO TABS
10.0000 mg | ORAL_TABLET | Freq: Every day | ORAL | Status: DC
  Administered 2023-08-29 – 2023-09-02 (×5): 10 mg via ORAL
  Filled 2023-08-28 (×6): qty 1

## 2023-08-28 MED ORDER — FLUTICASONE FUROATE-VILANTEROL 100-25 MCG/ACT IN AEPB
1.0000 | INHALATION_SPRAY | Freq: Every day | RESPIRATORY_TRACT | Status: DC
  Administered 2023-08-30 – 2023-09-02 (×4): 1 via RESPIRATORY_TRACT
  Filled 2023-08-28 (×3): qty 28

## 2023-08-28 MED ORDER — ALBUTEROL SULFATE (2.5 MG/3ML) 0.083% IN NEBU: 2.5000 mg | INHALATION_SOLUTION | RESPIRATORY_TRACT | Status: DC | PRN

## 2023-08-28 MED ORDER — VITAMIN B-12 100 MCG PO TABS
100.0000 ug | ORAL_TABLET | Freq: Every day | ORAL | Status: DC
Start: 2023-08-28 — End: 2023-09-02
  Administered 2023-08-29 – 2023-09-02 (×5): 100 ug via ORAL
  Filled 2023-08-28 (×6): qty 1

## 2023-08-28 MED ORDER — SIMETHICONE 80 MG PO CHEW: 80.0000 mg | CHEWABLE_TABLET | Freq: Four times a day (QID) | ORAL | Status: DC | PRN

## 2023-08-28 MED ORDER — FERROUS SULFATE 325 (65 FE) MG PO TABS
325.0000 mg | ORAL_TABLET | Freq: Every day | ORAL | Status: DC
  Administered 2023-08-28 – 2023-09-02 (×6): 325 mg via ORAL
  Filled 2023-08-28 (×6): qty 1

## 2023-08-28 MED ORDER — ADULT MULTIVITAMIN W/MINERALS CH
1.0000 | ORAL_TABLET | Freq: Every day | ORAL | Status: DC
  Administered 2023-08-28 – 2023-09-02 (×6): 1 via ORAL
  Filled 2023-08-28 (×6): qty 1

## 2023-08-28 MED ORDER — LOPERAMIDE HCL 2 MG PO CAPS
2.0000 mg | ORAL_CAPSULE | ORAL | Status: DC | PRN
  Administered 2023-08-29: 2 mg via ORAL
  Filled 2023-08-28 (×2): qty 1

## 2023-08-28 MED ORDER — VITAMIN C 500 MG PO TABS
1000.0000 mg | ORAL_TABLET | Freq: Every day | ORAL | Status: DC
  Administered 2023-08-28 – 2023-09-02 (×6): 1000 mg via ORAL
  Filled 2023-08-28 (×6): qty 2

## 2023-08-28 MED ORDER — SODIUM CHLORIDE 0.9 % IV SOLN
2.0000 g | Freq: Two times a day (BID) | INTRAVENOUS | Status: DC
  Administered 2023-08-28 – 2023-08-29 (×3): 2 g via INTRAVENOUS
  Filled 2023-08-28 (×3): qty 12.5

## 2023-08-28 NOTE — ED Notes (Signed)
Nephrostomy drain emptied with approximately yellow urine output

## 2023-08-28 NOTE — Progress Notes (Signed)
PHARMACIST - PHYSICIAN COMMUNICATION  CONCERNING:  Enoxaparin (Lovenox) for DVT Prophylaxis    RECOMMENDATION: Patient was prescribed enoxaprin 40mg  q24 hours for VTE prophylaxis.   Filed Weights   08/27/23 1811  Weight: 101.4 kg (223 lb 8 oz)    Body mass index is 45.14 kg/m.  Estimated Creatinine Clearance: 58.3 mL/min (by C-G formula based on SCr of 0.97 mg/dL).   Based on [facility] policy patient is candidate for enoxaparin 0.5mg /kg TBW SQ every 24 hours based on BMI being >30.  DESCRIPTION: Pharmacy has adjusted enoxaparin dose per [facility] policy.  Patient is now receiving enoxaparin 0.5 mg/kg every 24 hours   Otelia Sergeant, PharmD, [facility] 08/28/2023 12:03 AM

## 2023-08-28 NOTE — Progress Notes (Signed)
Progress Note   Patient: Courtney Mack ZOX:[id number] DOB: 04-15-1956 DOA: 08/27/2023     1 DOS: the patient was seen and examined on 08/28/2023   Brief hospital course: HPI on admission 08/27/2023: "Courtney Mack is a 68 y.o. female with medical history significant of left-sided hydronephrosis status post nephrostomy tube placement, recurrent UTI and sepsis due to UTI, hypertension, COPD, history of extensive bilateral PE and DVT (s/p of IVC filter, on lovenox), dCHF, who presents with left flank pain, burning with urination. ..." See H&P for full HPI on admission & ED course.  Pt was admitted for further evaluation and management of recurrent complicated UTI in the setting of left nephrostomy tube, started on IV antibiotics with Cefepime given prior urine cultures growing Pseudomonas, pending urine culture results.    Further hospital course and management as outlined below.   Assessment and Plan:  Complicated UTI in the setting of  Left nephrostomy tube present  History of Pseudomonas UTI's based on prior culture data. Pt reports nephrostomy has not been draining properly, however bag and tubing are draining yellow appearing urine --Continue IV Cefepime --Follow urine cultures --Consult IR for nephrostomy exchange --Strict I/O's to capture urine output from nephrostomy   COPD -stable - not exacerbated --Continue Bronchodilators and PRN Mucinex   Chronic diastolic CHF 2D echo: 11/21/2022 showed EF 60 to 65% with grade 1 diastolic dysfunction.  Currently appears overall compensated --Continue holding Lasix due to softer BP, resume when indicated --Daily weight to monitor volume status    HTN (hypertension): Soft BP's on admission --Holding antihypertensives, resume when appropriate   Dyslipidemia --Continue Crestor   Morbid Obesity - Body mass index is 45.14 kg/m. Complicates overall care and prognosis.  Recommend lifestyle modifications including physical activity  and diet for weight loss and overall long-term health.   History of pulmonary embolus (PE) and DVT --Continue full dose Lovenox 30 mg BID      Subjective: Pt seen in the ED holding for a bed this AM. She reports feeling slightly better.  She asks about transfer to [facility] for her nephrostomy, stating she feels the kidney is no longer working and wants it removed.  No fever chills, no nausea vomiting.  Pt does report ongoing left flank and mid back pain.   Physical Exam: Vitals:   08/28/23 1240 08/28/23 1245 08/28/23 1330 08/28/23 1425  BP:   (!) 117/59   Pulse: 96 89 91 84  Resp: 14 17 (!) 27 20  Temp:   98.1 F (36.7 C)   TempSrc:   Oral   SpO2: 98% 100% 99% 96%  Weight:      Height:       General exam: awake, alert, no acute distress HEENT: moist mucus membranes, hearing grossly normal  Respiratory system: CTAB, no wheezes, rales or rhonchi, normal respiratory effort. Cardiovascular system: normal S1/S2, RRR, non-pitting BLE edema.   Genitourinary system: Left nephrostomy present with tubing and bag full of yellow translucent urine Gastrointestinal system: soft, NT, ND, no HSM felt, +bowel sounds. Central nervous system: A&O x 3. no gross focal neurologic deficits, normal speech Skin: dry, intact, normal temperature Psychiatry: normal mood, congruent affect, judgement and insight appear normal   Data Reviewed:  Notable labs & studies --   BMP with glucose 128, Ca 8.6 otherwise normal CBC with WBC 3.6, Hbg 10.3 >> 9.4  Micro Urine culture -- pending   Family Communication: None present. Pt is able to update.   Will attempt to call  as time allows.  Disposition: Status is: Inpatient Remains inpatient appropriate because: remains on empiric IV antibiotic coverage pending urine culture results and IR exchange of nephrostomy tube   Planned Discharge Destination: prior living environment    Time spent: 45 minutes  Author: Pennie Banter, DO 08/28/2023 3:19  PM  For on call review [email or web address].

## 2023-08-28 NOTE — ED Notes (Signed)
Called CCMD to verify transfer of monitoring

## 2023-08-28 NOTE — ED Notes (Signed)
Provided blanket and lights off for comfort per pt request pt aox4

## 2023-08-28 NOTE — Progress Notes (Signed)
Pharmacy Antibiotic Note  Courtney Mack is a 68 y.o. female admitted on 08/27/2023 with complicated UTI with hx of Pseudomonas.  Pharmacy has been consulted for Cefepime dosing.  Plan: Cefepime  2 gm q12hr per indication & renal fxn.  Pharmacy will continue to follow and will adjust abx dosing whenever warranted.  Temp (24hrs), Avg:98.6 F (37 C), Min:98.5 F (36.9 C), Max:98.6 F (37 C)   Recent Labs  Lab 08/27/23 1820  WBC 3.3*  CREATININE 0.97    Estimated Creatinine Clearance: 58.3 mL/min (by C-G formula based on SCr of 0.97 mg/dL).    No Known Allergies  Antimicrobials this admission: 2/19 Ceftriaxone >> x 1 dose 2/20 Cefepime >>  Microbiology results: No lab cx currently ordered or pending at this time.  Thank you for allowing pharmacy to be a part of this patient's care.  Otelia Sergeant, PharmD, MBA 08/28/2023 1:06 AM

## 2023-08-28 NOTE — ED Notes (Signed)
Pt brief changed, x1 urination. Pt denies other needs at this time

## 2023-08-29 ENCOUNTER — Encounter: Payer: Self-pay | Admitting: Internal Medicine

## 2023-08-29 ENCOUNTER — Inpatient Hospital Stay: Payer: 59 | Admitting: Radiology

## 2023-08-29 DIAGNOSIS — N390 Urinary tract infection, site not specified: Secondary | ICD-10-CM | POA: Diagnosis not present

## 2023-08-29 HISTORY — PX: IR CONVERT LEFT NEPHROSTOMY TO NEPHROURETERAL CATH: IMG6067

## 2023-08-29 LAB — CBC
HCT: 27.6 % — ABNORMAL LOW (ref 36.0–46.0)
Hemoglobin: 8.6 g/dL — ABNORMAL LOW (ref 12.0–15.0)
MCH: 28.7 pg (ref 26.0–34.0)
MCHC: 31.2 g/dL (ref 30.0–36.0)
MCV: 92 fL (ref 80.0–100.0)
Platelets: 202 10*3/uL (ref 150–400)
RBC: 3 MIL/uL — ABNORMAL LOW (ref 3.87–5.11)
RDW: 17.2 % — ABNORMAL HIGH (ref 11.5–15.5)
WBC: 3.4 10*3/uL — ABNORMAL LOW (ref 4.0–10.5)
nRBC: 0 % (ref 0.0–0.2)

## 2023-08-29 LAB — MRSA NEXT GEN BY PCR, NASAL: MRSA by PCR Next Gen: NOT DETECTED

## 2023-08-29 LAB — CREATININE, SERUM
Creatinine, Ser: 0.85 mg/dL (ref 0.44–1.00)
GFR, Estimated: >60 mL/min (ref >60)

## 2023-08-29 MED ORDER — IPRATROPIUM-ALBUTEROL 0.5-2.5 (3) MG/3ML IN SOLN
3.0000 mL | Freq: Two times a day (BID) | RESPIRATORY_TRACT | Status: DC
  Administered 2023-08-30: 3 mL via RESPIRATORY_TRACT
  Filled 2023-08-29: qty 3

## 2023-08-29 MED ORDER — LIDOCAINE HCL 1 % IJ SOLN
10.0000 mL | Freq: Once | INTRAMUSCULAR | Status: AC
  Administered 2023-08-29: 10 mL via INTRADERMAL
  Filled 2023-08-29: qty 10

## 2023-08-29 MED ORDER — IOHEXOL 300 MG/ML  SOLN
10.0000 mL | Freq: Once | INTRAMUSCULAR | Status: AC | PRN
  Administered 2023-08-29: 20 mL

## 2023-08-29 MED ORDER — SODIUM CHLORIDE 0.9 % IV SOLN
2.0000 g | Freq: Three times a day (TID) | INTRAVENOUS | Status: DC
  Administered 2023-08-29 – 2023-08-31 (×6): 2 g via INTRAVENOUS
  Filled 2023-08-29 (×8): qty 12.5

## 2023-08-29 MED ORDER — LIDOCAINE HCL 1 % IJ SOLN
INTRAMUSCULAR | Status: AC
  Filled 2023-08-29: qty 20

## 2023-08-29 NOTE — Progress Notes (Signed)
Pharmacy Antibiotic Note  Courtney Mack is a 68 y.o. female admitted on 08/27/2023 with complicated UTI with hx of Pseudomonas.  Pharmacy has been consulted for Cefepime dosing.  Plan: Day 3 abx Continue Cefepime  2 gm q8hr  Pharmacy will continue to follow and will adjust abx dosing whenever warranted.  Temp (24hrs), Avg:98 F (36.7 C), Min:97.9 F (36.6 C), Max:98.1 F (36.7 C)   Recent Labs  Lab 08/27/23 1820 08/28/23 0430 08/29/23 0456  WBC 3.3* 3.6* 3.4*  CREATININE 0.97 0.95 0.85    Estimated Creatinine Clearance: 66.5 mL/min (by C-G formula based on SCr of 0.85 mg/dL).    No Known Allergies  Antimicrobials this admission: 2/19 Ceftriaxone >> x 1 dose 2/20 Cefepime >>  Microbiology results: 2/21 Ucx: GNR  Thank you for allowing pharmacy to be a part of this patient's care.  Bettey Costa, PharmD Clinical Pharmacist 08/29/2023 11:53 AM

## 2023-08-29 NOTE — ED Notes (Signed)
Informed RN bed assigned 

## 2023-08-29 NOTE — Progress Notes (Signed)
IR at bedside

## 2023-08-29 NOTE — Progress Notes (Signed)
Progress Note   Patient: Courtney Mack QMV:[id number] DOB: Aug 07, 1955 DOA: 08/27/2023     2 DOS: the patient was seen and examined on 08/29/2023   Brief hospital course: HPI on admission 08/27/2023: "Courtney Mack is a 68 y.o. female with medical history significant of left-sided hydronephrosis status post nephrostomy tube placement, recurrent UTI and sepsis due to UTI, hypertension, COPD, history of extensive bilateral PE and DVT (s/p of IVC filter, on lovenox), dCHF, who presents with left flank pain, burning with urination. ..." See H&P for full HPI on admission & ED course.  Pt was admitted for further evaluation and management of recurrent complicated UTI in the setting of left nephrostomy tube, started on IV antibiotics with Cefepime given prior urine cultures growing Pseudomonas, pending urine culture results.    Further hospital course and management as outlined below.   Assessment and Plan:  Complicated UTI in the setting of  Left nephrostomy tube present  History of Pseudomonas UTI's based on prior culture data. Pt reports nephrostomy has not been draining properly, however bag and tubing are draining yellow appearing urine --Continue IV Cefepime --Follow urine culture - growing gram neg rods --Consult IR for nephrostomy exchange / evaluatin --Strict I/O's to capture urine output from nephrostomy   COPD -stable - not exacerbated --Continue Bronchodilators and PRN Mucinex   Chronic diastolic CHF 2D echo: 11/21/2022 showed EF 60 to 65% with grade 1 diastolic dysfunction.  Currently appears overall compensated --Continue holding Lasix due to softer BP, resume when indicated --Daily weight to monitor volume status    HTN (hypertension): Soft BP's on admission --Holding antihypertensives, resume when appropriate   Dyslipidemia --Continue Crestor   Morbid Obesity - Body mass index is 45.14 kg/m. Complicates overall care and prognosis.  Recommend lifestyle  modifications including physical activity and diet for weight loss and overall long-term health.   History of pulmonary embolus (PE) and DVT --Continue full dose Lovenox 30 mg BID      Subjective: Pt seen in the ED still holding for a bed this AM. She asks if we contacted [facility] about surgery to remove her kidney, stating she is tired of getting kidney infections every month.  Denies F/C, N/V or other acute complaints.   Physical Exam: Vitals:   08/29/23 1000 08/29/23 1100 08/29/23 1200 08/29/23 1300  BP:  (!) 106/50 120/61 (!) 105/51  Pulse: 79 70 73 84  Resp: 17     Temp:      TempSrc:      SpO2: 99% 100% 99%   Weight:      Height:       General exam: awake, alert, no acute distress HEENT: wearing glasses, moist mucus membranes, hearing grossly normal  Respiratory system: on room air, normal respiratory effort. Cardiovascular system: normal S1/S2, RRR, stable non-pitting BLE edema.   Genitourinary system: Left nephrostomy present with tubing and bag full of yellow translucent urine Gastrointestinal system: soft, NT, ND, no HSM felt, +bowel sounds. Central nervous system: A&O x 3. no gross focal neurologic deficits, normal speech Skin: dry, intact, normal temperature Psychiatry: normal mood, congruent affect, judgement and insight appear normal   Data Reviewed:  Notable labs & studies --   BMP with glucose 128, Ca 8.6 otherwise normal CBC with WBC 3.6, Hbg 10.3 >> 9.4  Micro Urine culture -- pending   Family Communication: None present. Pt is able to update.   Will attempt to call as time allows.   Disposition: Status is: Inpatient Remains inpatient  appropriate because: remains on empiric IV antibiotic coverage pending urine culture results and IR exchange/evaluation of nephrostomy tube   Planned Discharge Destination: prior living environment    Time spent: 42 minutes  Author: Pennie Banter, DO 08/29/2023 1:28 PM  For on call review [email or web address].

## 2023-08-29 NOTE — ED Notes (Signed)
Informed that pt was taken to IR and from there will go to unit

## 2023-08-29 NOTE — Evaluation (Signed)
Physical Therapy Evaluation Patient Details Name: Courtney Mack MRN: [id number] DOB: 07-Jul-1956 Today's Date: 08/29/2023  History of Present Illness  Pt is a 68 y.o. female  who presents with left flank pain, burning with urination. Admitted with complicated UTI in setting of L nephro tube. PMHx: recurrent PE/DVT s/p IVC (not on AC due to recurrent GI hemorrhage), HFpEF, COPD, left-sided hydronephrosis s/p nephrostomy tube placement, HTN, DM2, HLD, dCHF, recurrent UTI and sepsis due to UTI, COPD.   Clinical Impression  Patient received in bed sleeping. She wakes easily and agrees to PT. Although hesitant due to sob with mobility. Patient is min/mod A for bed mobility and is able to stand with min A from elevated bed and took a few side steps with RW. Patient is quite sob with this, however O2 sats at 100%. Patient will continue to benefit from skilled PT to improve strength, endurance and mobility.          If plan is discharge home, recommend the following: Assist for transportation;Help with stairs or ramp for entrance;A lot of help with walking and/or transfers;A lot of help with bathing/dressing/bathroom   Can travel by private vehicle    no    Equipment Recommendations None recommended by PT  Recommendations for Other Services       Functional Status Assessment Patient has had a recent decline in their functional status and demonstrates the ability to make significant improvements in function in a reasonable and predictable amount of time.     Precautions / Restrictions Precautions Precautions: Fall Precaution/Restrictions Comments: LLQ nephrostomy tube Restrictions Weight Bearing Restrictions Per Provider Order: No      Mobility  Bed Mobility Overal bed mobility: Needs Assistance Bed Mobility: Supine to Sit, Sit to Supine     Supine to sit: Min assist, Used rails, HOB elevated Sit to supine: Mod assist   General bed mobility comments: Min A to raise trunk to  seated position. Mod A to bring LEs up onto bed    Transfers Overall transfer level: Needs assistance Equipment used: Rolling walker (2 wheels)   Sit to Stand: Min assist, From elevated surface           General transfer comment: Min/CGA for STS from EOB to RW with bed height slightly elevated    Ambulation/Gait Ambulation/Gait assistance: Min assist Gait Distance (Feet): 4 Feet Assistive device: Rolling walker (2 wheels) Gait Pattern/deviations: Step-to pattern, Decreased step length - right, Decreased step length - left Gait velocity: decr     General Gait Details: patient able to side step at edge of bed ~4 feet. Increased RR rate and requires calming/PLB to regulate. O2 sats at 100% on room air  Stairs            Wheelchair Mobility     Tilt Bed    Modified Rankin (Stroke Patients Only)       Balance Overall balance assessment: Needs assistance Sitting-balance support: Feet supported Sitting balance-Leahy Scale: Good     Standing balance support: Bilateral upper extremity supported, During functional activity, Reliant on assistive device for balance Standing balance-Leahy Scale: Fair                               Pertinent Vitals/Pain Pain Assessment Pain Assessment: Faces Faces Pain Scale: Hurts a little bit Pain Location: back and knees Pain Descriptors / Indicators: Aching Pain Intervention(s): Monitored during session, Repositioned    Home Living Family/patient  expects to be discharged to:: Private residence Living Arrangements: Children Available Help at Discharge: Family;Available 24 hours/day Type of Home: Apartment Home Access: Stairs to enter Entrance Stairs-Rails: None Entrance Stairs-Number of Steps: 1   Home Layout: One level Home Equipment: Rollator (4 wheels);Cane - single point;BSC/3in1;Shower seat;Wheelchair - manual Additional Comments: sponge bathes at baseline. sleeps in a recliner at home    Prior Function  Prior Level of Function : Needs assist       Physical Assist : ADLs (physical);Mobility (physical) Mobility (physical): Gait ADLs (physical): IADLs;Dressing;Bathing;Grooming Mobility Comments: amb in house with rollator, has manual w/c for community distances ADLs Comments: Reports independent with bathing, and dressing,  excluding compression socks which her kids/grandkids assist with     Extremity/Trunk Assessment   Upper Extremity Assessment Upper Extremity Assessment: Defer to OT evaluation    Lower Extremity Assessment Lower Extremity Assessment: Generalized weakness    Cervical / Trunk Assessment Cervical / Trunk Assessment: Normal  Communication   Communication Communication: No apparent difficulties    Cognition Arousal: Alert Behavior During Therapy: WFL for tasks assessed/performed, Anxious                           PT - Cognition Comments: Anxious with mobility. Increased RR Following commands: Intact       Cueing Cueing Techniques: Verbal cues     General Comments General comments (skin integrity, edema, etc.): tele not getting accurate readings with highest RR of 30 although NT counting 36; sp02 WFL on RA if accurate; pt severly dyspneic on exertion requiring standing rest break    Exercises     Assessment/Plan    PT Assessment Patient needs continued PT services  PT Problem List Decreased strength;Decreased activity tolerance;Decreased mobility;Decreased balance;Cardiopulmonary status limiting activity;Obesity       PT Treatment Interventions DME instruction;Stair training;Gait training;Functional mobility training;Therapeutic activities;Therapeutic exercise;Balance training;Neuromuscular re-education;Cognitive remediation;Patient/family education    PT Goals (Current goals can be found in the Care Plan section)  Acute Rehab PT Goals Patient Stated Goal: to go home, breathe better PT Goal Formulation: With patient Time For Goal  Achievement: 09/05/23 Potential to Achieve Goals: Fair    Frequency Min 1X/week     Co-evaluation               AM-PAC PT "6 Clicks" Mobility  Outcome Measure Help needed turning from your back to your side while in a flat bed without using bedrails?: A Little Help needed moving from lying on your back to sitting on the side of a flat bed without using bedrails?: A Little Help needed moving to and from a bed to a chair (including a wheelchair)?: A Little Help needed standing up from a chair using your arms (e.g., wheelchair or bedside chair)?: A Little Help needed to walk in hospital room?: A Lot Help needed climbing 3-5 steps with a railing? : Total 6 Click Score: 15    End of Session   Activity Tolerance: Other (comment) (SOB) Patient left: in bed;with call bell/phone within reach Nurse Communication: Mobility status PT Visit Diagnosis: Muscle weakness (generalized) (M62.81);Unsteadiness on feet (R26.81);Difficulty in walking, not elsewhere classified (R26.2)    Time: 6045-4098 PT Time Calculation (min) (ACUTE ONLY): 10 min   Charges:   PT Evaluation $PT Eval Low Complexity: 1 Low   PT General Charges $$ ACUTE PT VISIT: 1 Visit         Graysyn Bache, PT, GCS 08/29/23,2:05 PM

## 2023-08-29 NOTE — Evaluation (Signed)
Occupational Therapy Evaluation Patient Details Name: Courtney Mack MRN: [id number] DOB: Jun 07, 1956 Today's Date: 08/29/2023   History of Present Illness   Pt is a 68 y.o. female  who presents with left flank pain, burning with urination. Admitted with complicated UTI in setting of L nephro tube. PMHx: recurrent PE/DVT s/p IVC (not on AC due to recurrent GI hemorrhage), HFpEF, COPD, left-sided hydronephrosis s/p nephrostomy tube placement, HTN, DM2, HLD, dCHF, recurrent UTI and sepsis due to UTI, COPD.     Clinical Impressions Pt was seen for OT evaluation this date. Prior to hospital admission, pt was living at home with her children and grandchildren who were available to assist as needed. Pt was ambulatory with a rollator or RW and reports mostly MOD I with ADLs excluding donning her compression socks.   Pt presents to acute OT demonstrating impaired ADL performance and functional mobility 2/2 weakness and low activity tolerance (See OT problem list for additional functional deficits). Pt currently requires MIN A for bed mobility. Max A for LB dressing to don socks, Mod A to don mesh panties. STS from EOB with Min A to RW and able to ambulate ~6 feet and pt very dyspneic, needing a standing rest break halfway. RR stating up to 30, however appeared to be much higher. SP02 WFL. Notified nurse to check leads for placement, etc. Pt reports having to walk further distances in her home to get to the bathroom, but does have a W/C at home she can use. Pt returned to bed with BLE assist. Pt would benefit from skilled OT services to address noted impairments and functional limitations (see below for any additional details) in order to maximize safety and independence while minimizing falls risk and caregiver burden. Do anticipate the need for follow up OT services upon acute hospital DC.      If plan is discharge home, recommend the following:   A little help with walking and/or transfers;A lot of  help with bathing/dressing/bathroom;Assistance with cooking/housework;Assist for transportation;Help with stairs or ramp for entrance     Functional Status Assessment   Patient has had a recent decline in their functional status and demonstrates the ability to make significant improvements in function in a reasonable and predictable amount of time.     Equipment Recommendations   BSC/3in1     Recommendations for Other Services         Precautions/Restrictions   Precautions Precautions: Fall Precaution/Restrictions Comments: LLQ nephrostomy tube Restrictions Weight Bearing Restrictions Per Provider Order: No     Mobility Bed Mobility Overal bed mobility: Needs Assistance Bed Mobility: Supine to Sit     Supine to sit: Min assist, HOB elevated, Used rails Sit to supine: Min assist, Mod assist   General bed mobility comments: Min A for trunkal elevation via HHA to reach EOB and Min/Mod A for BLE management to return to bed    Transfers Overall transfer level: Needs assistance Equipment used: Rolling walker (2 wheels) Transfers: Sit to/from Stand Sit to Stand: Min assist, Contact guard assist, From elevated surface           General transfer comment: Min/CGA for STS from EOB to RW with bed height slightly elevated      Balance Overall balance assessment: Needs assistance Sitting-balance support: Feet supported Sitting balance-Leahy Scale: Fair Sitting balance - Comments: no LOB while seated EOB   Standing balance support: Reliant on assistive device for balance, Bilateral upper extremity supported Standing balance-Leahy Scale: Fair Standing balance comment: heavy  UE use of RW and pt SOB; CGA for mobility and standing to perform LB dressing                           ADL either performed or assessed with clinical judgement   ADL Overall ADL's : Needs assistance/impaired                     Lower Body Dressing: Sitting/lateral  leans;Sit to/from stand;Maximal assistance;Moderate assistance Lower Body Dressing Details (indicate cue type and reason): Max A for socks at EOB; Mod A for mesh panties via STS at EOB             Functional mobility during ADLs: Contact guard assist;Minimal assistance       Vision         Perception         Praxis         Pertinent Vitals/Pain Pain Assessment Pain Assessment: 0-10 Pain Score: 5  Pain Location: back and knees Pain Descriptors / Indicators: Aching Pain Intervention(s): Monitored during session, Repositioned     Extremity/Trunk Assessment Upper Extremity Assessment Upper Extremity Assessment: Generalized weakness   Lower Extremity Assessment Lower Extremity Assessment: Generalized weakness       Communication Communication Communication: No apparent difficulties   Cognition Arousal: Alert Behavior During Therapy: WFL for tasks assessed/performed Cognition: No apparent impairments                               Following commands: Intact       Cueing  General Comments          Exercises Other Exercises Other Exercises: Edu on role of OT and ECS/PLB technique during session.   Shoulder Instructions      Home Living Family/patient expects to be discharged to:: Private residence Living Arrangements: Children Available Help at Discharge: Family;Available 24 hours/day Type of Home: Apartment Home Access: Stairs to enter Entrance Stairs-Number of Steps: 1 Entrance Stairs-Rails: None Home Layout: One level     Bathroom Shower/Tub: Chief Strategy Officer: Standard Bathroom Accessibility: Yes   Home Equipment: Rollator (4 wheels);Cane - single point;BSC/3in1;Shower seat;Wheelchair - manual   Additional Comments: sponge bathes at baseline. sleeps in a recliner at home      Prior Functioning/Environment Prior Level of Function : Needs assist             Mobility Comments: amb in house with  rollator, has manual w/c for community distances ADLs Comments: Reports independent with bathing, and dressing,  excluding compression socks which her kids/grandkids assist with    OT Problem List: Decreased strength;Decreased activity tolerance   OT Treatment/Interventions: Self-care/ADL training;Energy conservation;Therapeutic activities      OT Goals(Current goals can be found in the care plan section)   Acute Rehab OT Goals Patient Stated Goal: improve endurance OT Goal Formulation: With patient Time For Goal Achievement: 09/12/23 Potential to Achieve Goals: Fair ADL Goals Pt Will Transfer to Toilet: with supervision;bedside commode;ambulating;regular height toilet Pt Will Perform Toileting - Clothing Manipulation and hygiene: with supervision;sit to/from stand;sitting/lateral leans Additional ADL Goal #1: Pt will verbalize/demo implementation of PLB techniques and pacing during ADL performance and transfers to prevent overexertion 3/3 trials.   OT Frequency:  Min 1X/week    Co-evaluation              AM-PAC OT "6 Clicks" Daily Activity  Outcome Measure Help from another person eating meals?: None Help from another person taking care of personal grooming?: None Help from another person toileting, which includes using toliet, bedpan, or urinal?: A Lot Help from another person bathing (including washing, rinsing, drying)?: A Lot Help from another person to put on and taking off regular upper body clothing?: A Little Help from another person to put on and taking off regular lower body clothing?: A Lot 6 Click Score: 17   End of Session Equipment Utilized During Treatment: Rolling walker (2 wheels) Nurse Communication: Mobility status  Activity Tolerance: Patient tolerated treatment well;Patient limited by fatigue Patient left: in bed;with call bell/phone within reach;with bed alarm set  OT Visit Diagnosis: Other abnormalities of gait and mobility (R26.89)                 Time: 5366-4403 OT Time Calculation (min): 26 min Charges:  OT General Charges $OT Visit: 1 Visit OT Evaluation $OT Eval Moderate Complexity: 1 Mod OT Treatments $Self Care/Home Management : 8-22 mins Saida Lonon, OTR/L  08/29/23, 11:08 AM  Roshana Shuffield E Lynix Bonine 08/29/2023, 11:01 AM

## 2023-08-30 DIAGNOSIS — N390 Urinary tract infection, site not specified: Secondary | ICD-10-CM | POA: Diagnosis not present

## 2023-08-30 MED ORDER — FUROSEMIDE 20 MG PO TABS
20.0000 mg | ORAL_TABLET | Freq: Every day | ORAL | Status: DC
  Administered 2023-08-30 – 2023-09-02 (×4): 20 mg via ORAL
  Filled 2023-08-30 (×4): qty 1

## 2023-08-30 NOTE — Progress Notes (Signed)
 Mobility Specialist - Progress Note   Pre-mobility: BP(116/51), SpO2(98) During mobility: HR(118) Post-mobility: HR(88), ZOX0(96)    08/30/23 1355  Mobility  Activity Ambulated with assistance in room  Level of Assistance Contact guard assist, steadying assist  Assistive Device Front wheel walker  Distance Ambulated (ft) 6 ft  Range of Motion/Exercises Active  Activity Response Tolerated well  Mobility Referral Yes  Mobility visit 1 Mobility  Mobility Specialist Start Time (ACUTE ONLY) 1329  Mobility Specialist Stop Time (ACUTE ONLY) 1350  Mobility Specialist Time Calculation (min) (ACUTE ONLY) 21 min   Pt resting in bed on RA upon entry. Pt performed bed mobility independent utilizing the bed railing to pull herself to EOB completely. Pt does have inconsistent bladder; brief applied. Pt STS and ambulates half way to door before taking x1 2 minute rest break. Pt endorses SOB and feeling anxious but encouraged to continue mobility. Pt returned to bed and left with needs in reach. Bed alarm activated.   Johnathan Hausen Mobility Specialist 08/30/23, 2:03 PM

## 2023-08-30 NOTE — Progress Notes (Deleted)
   08/30/23 0900 08/30/23 1100 08/30/23 1300  Assess: MEWS Score  Temp 98.5 F (36.9 C) 98.3 F (36.8 C) 98.7 F (37.1 C)  BP (!) 95/42 (Nurse Robyn notified  Simultaneous filing. User may not have seen previous data.) (!) 91/41 (Nurse Robyn Notified) (!) 109/50 (Nurse Robyn notified  Simultaneous filing. User may not have seen previous data.)  MAP (mmHg) (!) 58 (!) 54 67  Pulse Rate (!) 101 95 89  Resp 16 16 18   Level of Consciousness Alert Alert Alert  SpO2 96 % 95 % 97 %  O2 Device Room Air Room Air Room Air  Assess: MEWS Score  MEWS Temp 0 0 0  MEWS Systolic 1 1 0  MEWS Pulse 1 0 0  MEWS RR 0 0 0  MEWS LOC 0 0 0  MEWS Score 2 1 0  MEWS Score Color Yellow Riki Sheer  Notify: Charge Nurse/RN  Name of Charge Nurse/RN Notified  --   Nurse, adult)  --   Provider Notification  Provider Name/Title  --  Esaw Grandchild MD  --   Date Provider Notified  --  08/30/23  --   Time Provider Notified  --  1250  --   Method of Notification  --  Page (secure chat)  --   Notification Reason  --  Other (Comment) (notification of Yello MEWS with 0900 VS.)  --   Provider response  --  No new orders;Other (Comment) (per MD hold PO lasix if MAP <65)  --   Date of Provider Response  --  08/30/23  --   Time of Provider Response  --  1255  --   Assess: SIRS CRITERIA  SIRS Temperature  0 0 0  SIRS Respirations  0 0 0  SIRS Pulse 1 1 0  SIRS WBC 0 0 0  SIRS Score Sum  1 1 0    08/30/23 1604  Assess: MEWS Score  Temp 98.2 F (36.8 C)  BP (!) 114/44  MAP (mmHg) (!) 63  Pulse Rate 92  Resp 20  Level of Consciousness  --   SpO2 96 %  O2 Device Room Air  Assess: MEWS Score  MEWS Temp 0  MEWS Systolic 0  MEWS Pulse 0  MEWS RR 0  MEWS LOC 0  MEWS Score 0  MEWS Score Color Green  Notify: Charge Nurse/RN  Name of Charge Nurse/RN Notified  --   Provider Notification  Provider Name/Title  --   Date Provider Notified  --   Time Provider Notified  --   Method of Notification  --    Notification Reason  --   Provider response  --   Date of Provider Response  --   Time of Provider Response  --   Assess: SIRS CRITERIA  SIRS Temperature  0  SIRS Respirations  0  SIRS Pulse 1  SIRS WBC 0  SIRS Score Sum  1

## 2023-08-30 NOTE — Plan of Care (Signed)

## 2023-08-30 NOTE — Progress Notes (Signed)
 08/30/23 0900 08/30/23 1100 08/30/23 1300  Assess: MEWS Score  Temp 98.5 F (36.9 C) 98.3 F (36.8 C) 98.7 F (37.1 C)  BP (!) 95/42 (Nurse Robyn notified  ) (!) 91/41 (Nurse Robyn Notified) (!) 109/50 (Nurse Robyn notified  )  MAP (mmHg) (!) 58 (!) 54 67  Pulse Rate (!) 101 95 89  Resp 16 16 18   Level of Consciousness Alert Alert Alert  SpO2 96 % 95 % 97 %  O2 Device Room Air Room Air Room Air  Assess: MEWS Score  MEWS Temp 0 0 0  MEWS Systolic 1 1 0  MEWS Pulse 1 0 0  MEWS RR 0 0 0  MEWS LOC 0 0 0  MEWS Score 2 1 0  MEWS Score Color Yellow Green Green  Assess: if the MEWS score is Yellow or Red  Were vital signs accurate and taken at a resting state?  --  Yes  --   Does the patient meet 2 or more of the SIRS criteria?  --  Yes  --   Does the patient have a confirmed or suspected source of infection?  --  No  --   MEWS guidelines implemented   --  Yes, yellow  --   Treat  MEWS Interventions  --  Considered administering scheduled or prn medications/treatments as ordered  --   Take Vital Signs  Increase Vital Sign Frequency   --  Yellow: Q2hr x1, continue Q4hrs until patient remains green for 12hrs  --   Escalate  MEWS: Escalate  --  Yellow: Discuss with charge nurse and consider notifying provider and/or RRT  --   Notify: Charge Nurse/RN  Name of Charge Nurse/RN Notified  --   Nurse, adult)  --   Provider Notification  Provider Name/Title  --  Esaw Grandchild MD  --   Date Provider Notified  --  08/30/23  --   Time Provider Notified  --  1250  --   Method of Notification  --  Page (secure chat)  --   Notification Reason  --  Other (Comment) (notification of Yello MEWS with 0900 VS.)  --   Provider response  --  No new orders;Other (Comment) (per MD hold PO lasix if MAP <65)  --   Date of Provider Response  --  08/30/23  --   Time of Provider Response  --  1255  --   Assess: SIRS CRITERIA  SIRS Temperature  0 0 0  SIRS Respirations  0 0 0  SIRS Pulse 1 1 0   SIRS WBC 0 0 0  SIRS Score Sum  1 1 0    08/30/23 1604  Assess: MEWS Score  Temp 98.2 F (36.8 C)  BP (!) 114/44  MAP (mmHg) (!) 63  Pulse Rate 92  Resp 20  Level of Consciousness  --   SpO2 96 %  O2 Device Room Air  Assess: MEWS Score  MEWS Temp 0  MEWS Systolic 0  MEWS Pulse 0  MEWS RR 0  MEWS LOC 0  MEWS Score 0  MEWS Score Color Green  Assess: if the MEWS score is Yellow or Red  Were vital signs accurate and taken at a resting state?  --   Does the patient meet 2 or more of the SIRS criteria?  --   Does the patient have a confirmed or suspected source of infection?  --   MEWS guidelines implemented   --   Treat  MEWS Interventions  --   Take Vital Signs  Increase Vital Sign Frequency   --   Escalate  MEWS: Escalate  --   Notify: Charge Nurse/RN  Name of Charge Nurse/RN Notified  --   Provider Notification  Provider Name/Title  --   Date Provider Notified  --   Time Provider Notified  --   Method of Notification  --   Notification Reason  --   Provider response  --   Date of Provider Response  --   Time of Provider Response  --   Assess: SIRS CRITERIA  SIRS Temperature  0  SIRS Respirations  0  SIRS Pulse 1  SIRS WBC 0  SIRS Score Sum  1

## 2023-08-30 NOTE — TOC Initial Note (Signed)
 Transition of Care [facility]) - Initial/Assessment Note    Patient Details  Name: Courtney Mack MRN: [id number] Date of Birth: [birth year]  Transition of Care [facility]) CM/SW Contact:    Bing Quarry, RN Phone Number: 08/30/2023, 2:45 PM  Clinical Narrative:  2/22: Patient admitted on 08/27/23 from home where lives with children/grandchildren in one level, no stairs, apartment home. Has rollator, manual wheelchair at home for DME. Needs assist with home with compression socks otherwise reportedly independent with bathing, dressing prior to admission. Was recently discharged to home from [facility] with kidney infection after complicated UTI with presence of  nephrostomy tube. Diagnosed again with kidney infection., SOB.  Significant history of extensive bilateral PE and DVT s/p of IVC filter, on Lovenox, diastolic CHF, recurrent UTIs, sepsis.    Potential discharge to home on 08/31/23 pending urine culture results/IV antibiotic needs per provider progress note today.     Active with [facility] 06/15/22 - 08/13/22. Post discharge therapy recommended again by Pt/Ot evaluations. Pending discharge antibiotic needs.       Gabriel Cirri MSN RN CM  RN Case Manager [place]  Transitions of Care Direct Dial: [phone] (Weekends Only) [facility] Main Office Phone: [phone] [facility] Fax: [phone] [email or web address]             Patient Goals and CMS Choice            Expected Discharge Plan and Services                                              Prior Living Arrangements/Services                       Activities of Daily Living   ADL Screening (condition at time of admission) Independently performs ADLs?: Yes (appropriate for developmental age) Is the patient deaf or have difficulty hearing?: No Does the patient have difficulty seeing, even when wearing glasses/contacts?: No Does the patient have difficulty concentrating, remembering, or making decisions?:  No  Permission Sought/Granted                  Emotional Assessment              Admission diagnosis:  Lower urinary tract infection [N39.0] COPD exacerbation (HCC) [J44.1] Complicated UTI (urinary tract infection) [N39.0] Patient Active Problem List   Diagnosis Date Noted   COPD exacerbation (HCC) 08/27/2023   Other pulmonary embolism without acute cor pulmonale (HCC) 08/07/2023   Chronic deep vein thrombosis (DVT) of proximal vein of right lower extremity (HCC) 08/05/2023   Acute pulmonary embolism (HCC) 08/04/2023   Dysuria 06/22/2023   SOB (shortness of breath) 06/18/2023   HTN (hypertension) 06/18/2023   Complicated UTI (urinary tract infection) 05/26/2023   Diarrhea 05/26/2023   Blocked nephrostomy tube with hydronephrosis and hydroureter (HCC) 04/05/2023   Hypertension    Pressure injury of skin 02/11/2023   Severe sepsis with acute organ dysfunction (HCC) 02/10/2023   Obesity (BMI 30-39.9) 11/22/2022   Sepsis due to gram-negative UTI (HCC) 11/21/2022   Acute pyelonephritis 11/21/2022   Dyspnea 11/21/2022   Chest pain 11/21/2022   Hypokalemia 10/25/2022   Chronic obstructive pulmonary disease (COPD) (HCC) 10/24/2022   Iron deficiency anemia 10/24/2022   Symptomatic anemia 08/08/2022   COPD (chronic obstructive pulmonary disease) (HCC) 08/07/2022   Sepsis due to pneumonia (HCC) 06/12/2022  Multifocal pneumonia 06/12/2022   GERD without esophagitis 06/12/2022   Generalized weakness 06/12/2022   Nephrostomy status (HCC) 06/12/2022   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) 04/16/2022   GI bleed 03/14/2022   Acute on chronic anemia 02/24/2022   Hypotension 02/24/2022   Acute cystitis with hematuria    Lymphedema 02/08/2022   Hematuria 12/24/2021   History of pulmonary embolus (PE) 12/24/2021   Chronic radiation cystitis 11/17/2021   Hydronephrosis of left kidney    Gastric antral vascular ectasia    Complicated urinary tract  infection 09/13/2021   AKI (acute kidney injury) (HCC) 09/13/2021   Melena 07/18/2021   Acute on chronic blood loss anemia 07/18/2021   HLD (hyperlipidemia) 06/20/2021   Chronic diastolic CHF (congestive heart failure) (HCC) 06/20/2021   Risk factors for obstructive sleep apnea 11/10/2020   IDA (iron deficiency anemia) 09/21/2020   Endometrial cancer (HCC) 09/13/2020   Recurrent deep vein thrombosis (DVT) (HCC) 09/06/2020   Obesity, Class III, BMI 40-49.9 (morbid obesity) (HCC) 08/23/2020   Dyslipidemia 08/23/2020   PCP:  [facility]:   [id number] [place], [place] - [address] AT [facility] [address] [place] [address] [place] [address] Phone: [phone] Fax: [phone]     Social Drivers of Health (SDOH) Social History: SDOH Screenings   Food Insecurity: No Food Insecurity (08/29/2023)  Housing: Low Risk  (08/29/2023)  Transportation Needs: No Transportation Needs (08/29/2023)  Utilities: Not At Risk (08/29/2023)  Depression (PHQ2-9): Low Risk  (04/11/2022)  Social Connections: Unknown (08/29/2023)  Tobacco Use: Low Risk  (08/29/2023)   SDOH Interventions:     Readmission Risk Interventions    08/07/2023   10:26 AM 07/24/2023   11:14 AM 05/29/2023    9:42 AM  Readmission Risk Prevention Plan  Transportation Screening  Complete   Medication Review (RN Care Manager) Complete Complete   PCP or Specialist appointment within 3-5 days of discharge Complete Complete   HRI or Home Care Consult   Patient refused  SW Recovery Care/Counseling Consult Complete Complete Complete  Palliative Care Screening Not Applicable Not Applicable Not Applicable  Skilled Nursing Facility Not Applicable Not Applicable Not Applicable

## 2023-08-30 NOTE — Progress Notes (Signed)
 Progress Note   Patient: Courtney Mack ZOX:[id number] DOB: 23-Apr-1956 DOA: 08/27/2023     3 DOS: the patient was seen and examined on 08/30/2023   Brief hospital course: HPI on admission 08/27/2023: "Courtney Mack is a 68 y.o. female with medical history significant of left-sided hydronephrosis status post nephrostomy tube placement, recurrent UTI and sepsis due to UTI, hypertension, COPD, history of extensive bilateral PE and DVT (s/p of IVC filter, on lovenox), dCHF, who presents with left flank pain, burning with urination. ..." See H&P for full HPI on admission & ED course.  Pt was admitted for further evaluation and management of recurrent complicated UTI in the setting of left nephrostomy tube, started on IV antibiotics with Cefepime given prior urine cultures growing Pseudomonas, pending urine culture results.    Further hospital course and management as outlined below.   Assessment and Plan:  Complicated UTI in the setting of  Left nephrostomy tube present --- converted to nephroureteral tube by IR on 08/29/23. History of Pseudomonas UTI's based on prior culture data. 2/20 - Pt reports nephrostomy has not been draining properly, however bag and tubing are draining yellow appearing urine --Continue IV Cefepime --Follow urine culture - growing E coli and Flavobacterium odoratum -- susceptibilities pending --Consult IR for nephrostomy exchange / evaluatin --Strict I/O's to capture urine output from nephrostomy   COPD -stable - not exacerbated --Continue Bronchodilators and PRN Mucinex   Chronic diastolic CHF 2D echo: 11/21/2022 showed EF 60 to 65% with grade 1 diastolic dysfunction.  Currently appears overall compensated --Continue holding Lasix due to softer BP, resume when indicated --Daily weight to monitor volume status    HTN (hypertension): Soft BP's on admission BP's have been labile, soft at times. --Resume home Lasix - hold if MAP < 65    Dyslipidemia --Continue Crestor   Morbid Obesity - Body mass index is 42.43 kg/m. Complicates overall care and prognosis.  Recommend lifestyle modifications including physical activity and diet for weight loss and overall long-term health.   History of pulmonary embolus (PE) and DVT --Continue full dose Lovenox 30 mg BID      Subjective: Pt awake sitting up in bed when seen today.  She reports feeling better and denies acute complaints.  No issues with exchanged nephrostomy tube.  Pt states hopeful she won't keep getting infections since tube was changed.     Physical Exam: Vitals:   08/30/23 0728 08/30/23 0900 08/30/23 1100 08/30/23 1300  BP:  (!) 95/42 (!) 91/41 (!) 109/50  Pulse: 93 (!) 101 95 89  Resp: 16 16 16 18   Temp:  98.5 F (36.9 C) 98.3 F (36.8 C) 98.7 F (37.1 C)  TempSrc:  Oral Oral Oral  SpO2: 96% 96% 95% 97%  Weight:      Height:       General exam: awake, alert, no acute distress HEENT: wearing glasses, moist mucus membranes, hearing grossly normal  Respiratory system: on room air, normal respiratory effort. Cardiovascular system: RRR, stable non-pitting BLE edema.   Genitourinary system: Left nephrostomy present Central nervous system: A&O x 3. no gross focal neurologic deficits, normal speech Skin: dry, intact, normal temperature Psychiatry: normal mood, congruent affect, judgement and insight appear normal   Data Reviewed:  Notable labs & studies --   No new labs today  Notable labs from 2/21 BMP with glucose 128, Ca 8.6 otherwise normal CBC with WBC 3.6, Hbg 10.3 >> 9.4  Micro Urine culture -- growing E coli and Flavobacterium odoratum -- "  attempting to isolate for workup"   Family Communication: None present. Attempted to contact daughter, Courtney Mack, by phone this afternoon but was unsuccessful. Will attempt again this afternoon as time allows.   Disposition: Status is: Inpatient Remains inpatient appropriate because: remains on  empiric IV antibiotic coverage pending urine culture results.  Likely discharge tomorrow 2/23.     Planned Discharge Destination: prior living environment    Time spent: 38 minutes  Author: Pennie Banter, DO 08/30/2023 1:15 PM  For on call review [email or web address].

## 2023-08-30 NOTE — Plan of Care (Signed)

## 2023-08-31 DIAGNOSIS — N390 Urinary tract infection, site not specified: Secondary | ICD-10-CM | POA: Diagnosis not present

## 2023-08-31 MED ORDER — PHENAZOPYRIDINE HCL 100 MG PO TABS
100.0000 mg | ORAL_TABLET | Freq: Three times a day (TID) | ORAL | Status: AC
  Administered 2023-08-31 – 2023-09-02 (×6): 100 mg via ORAL
  Filled 2023-08-31 (×6): qty 1

## 2023-08-31 MED ORDER — PIPERACILLIN-TAZOBACTAM 3.375 G IVPB
3.3750 g | Freq: Three times a day (TID) | INTRAVENOUS | Status: DC
  Administered 2023-08-31 – 2023-09-01 (×3): 3.375 g via INTRAVENOUS
  Filled 2023-08-31 (×3): qty 50

## 2023-08-31 NOTE — Progress Notes (Addendum)
 Progress Note   Patient: Courtney Mack YNW:[id number] DOB: 1956-01-08 DOA: 08/27/2023     4 DOS: the patient was seen and examined on 08/31/2023   Brief hospital course: HPI on admission 08/27/2023: "AYUMI WANGERIN is a 68 y.o. female with medical history significant of left-sided hydronephrosis status post nephrostomy tube placement, recurrent UTI and sepsis due to UTI, hypertension, COPD, history of extensive bilateral PE and DVT (s/p of IVC filter, on lovenox), dCHF, who presents with left flank pain, burning with urination. ..." See H&P for full HPI on admission & ED course.  Pt was admitted for further evaluation and management of recurrent complicated UTI in the setting of left nephrostomy tube, started on IV antibiotics with Cefepime given prior urine cultures growing Pseudomonas, pending urine culture results.    Further hospital course and management as outlined below.   Assessment and Plan:  Complicated UTI in the setting of  Left nephrostomy tube present --- Converted to nephroureteral tube by IR on 08/29/23. History of Pseudomonas UTI's based on prior culture data. 2/20 - Pt reports nephrostomy has not been draining properly, however bag and tubing are draining yellow appearing urine --Initially on empiric IV Cefepime based on prior Pseudomonas UTI's --Urine culture growing ESBL E coli resistant to Cefepime --Also growing Flavobacterium odoratum -- susceptibilities pending --Change Cefepime to Zosyn --Add Pyridium x 2 days for dysuria --Strict I/O's to capture urine output from nephrostomy --Monitor fever curve, CBC    COPD -stable - not exacerbated --Continue Bronchodilators and PRN Mucinex   Chronic diastolic CHF 2D echo: 11/21/2022 showed EF 60 to 65% with grade 1 diastolic dysfunction.  Currently appears overall compensated --Resumed home Lasix with hold parameters --Daily weight to monitor volume status    HTN (hypertension): Soft BP's on admission BP's  have been labile, soft at times. --Resume home Lasix - hold if MAP < 65   Dyslipidemia --Continue Crestor   Morbid Obesity - Body mass index is 42.7 kg/m. Complicates overall care and prognosis.  Recommend lifestyle modifications including physical activity and diet for weight loss and overall long-term health.   History of pulmonary embolus (PE) and DVT --Continue full dose Lovenox 30 mg BID      Subjective: Pt seen sitting up in bed today, reports overall feels better.  She has been having dysuria since the exchange of her nephrostomy tube the other day.  Was not having it before that procedure and asks if this is normal / expected.  No other acute complaints.     Physical Exam: Vitals:   08/30/23 2004 08/31/23 0404 08/31/23 0500 08/31/23 0840  BP: (!) 113/44 (!) 109/46  119/60  Pulse: 98 90  84  Resp: 16 18  18   Temp: 99.6 F (37.6 C) 98.3 F (36.8 C)  99 F (37.2 C)  TempSrc: Oral Oral    SpO2: 95% 95%  95%  Weight:   95.9 kg   Height:       General exam: awake, alert, no acute distress HEENT: wearing glasses, moist mucus membranes, hearing grossly normal  Respiratory system: on room air, normal respiratory effort. Cardiovascular system: RRR, stable non-pitting BLE edema.   Genitourinary system: Left nephrostomy present draining light yellow urine Central nervous system: A&O x 3. no gross focal neurologic deficits, normal speech Skin: dry, intact, normal temperature Psychiatry: normal mood, congruent affect, judgement and insight appear normal   Data Reviewed:  Notable labs & studies --   No new labs today  Notable labs from  2/21 BMP with glucose 128, Ca 8.6 otherwise normal CBC with WBC 3.6, Hbg 10.3 >> 9.4  Micro Urine culture ---- ESBL E coli and Flavobacterium odoratum (susceptibility pending for this organism)   Family Communication: Attempted to contact daughter, [name], by phone 2/22 but was unsuccessful. Will attempt again this afternoon as time  allows.   Disposition: Status is: Inpatient Remains inpatient appropriate because: remains on IV antibiotics for UTI pending further improvement of ongoing dysuria.  Likely d/c in 1-2 days.     Planned Discharge Destination: prior living environment    Time spent: 38 minutes  Author: Pennie Banter, DO 08/31/2023 12:42 PM  For on call review [email or web address].

## 2023-08-31 NOTE — Consult Note (Signed)
 Pharmacy Antibiotic Note  Courtney Mack is a 68 y.o. female admitted on 08/27/2023 with UTI.  Pharmacy has been consulted for pip/tazo dosing.  Ucx: >=100,000 COLONIES/mL ESCHERICHIA COLI (ESBL)  Plan: Zosyn 3.375g IV q8h (4 hour infusion).  Height: 4\' 11"  (149.9 cm) Weight: 95.9 kg (211 lb 6.7 oz) IBW/kg (Calculated) : 43.2  Temp (24hrs), Avg:98.8 F (37.1 C), Min:98.2 F (36.8 C), Max:99.6 F (37.6 C)  Recent Labs  Lab 08/27/23 1820 08/28/23 0430 08/29/23 0456  WBC 3.3* 3.6* 3.4*  CREATININE 0.97 0.95 0.85    Estimated Creatinine Clearance: 64.3 mL/min (by C-G formula based on SCr of 0.85 mg/dL).    No Known Allergies  Antimicrobials this admission: 2/20 cefepime >> 2/23 2/23 pip/tazo >>   Dose adjustments this admission: None  Microbiology results: 2/23 UcxCx: >=100,000 COLONIES/mL ESCHERICHIA COLI (ESBL)  2/21 MRSA PCR: pending  Thank you for allowing pharmacy to be a part of this patient's care.  Ronnald Ramp, PharmD, BCPS 08/31/2023 12:48 PM

## 2023-09-01 ENCOUNTER — Other Ambulatory Visit (HOSPITAL_COMMUNITY): Payer: Self-pay

## 2023-09-01 ENCOUNTER — Encounter: Payer: Self-pay | Admitting: Oncology

## 2023-09-01 ENCOUNTER — Telehealth (HOSPITAL_COMMUNITY): Payer: Self-pay | Admitting: Pharmacy Technician

## 2023-09-01 DIAGNOSIS — N390 Urinary tract infection, site not specified: Secondary | ICD-10-CM | POA: Diagnosis not present

## 2023-09-01 LAB — CBC
HCT: 28.2 % — ABNORMAL LOW (ref 36.0–46.0)
Hemoglobin: 8.8 g/dL — ABNORMAL LOW (ref 12.0–15.0)
MCH: 28.4 pg (ref 26.0–34.0)
MCHC: 31.2 g/dL (ref 30.0–36.0)
MCV: 91 fL (ref 80.0–100.0)
Platelets: 167 10*3/uL (ref 150–400)
RBC: 3.1 MIL/uL — ABNORMAL LOW (ref 3.87–5.11)
RDW: 16.8 % — ABNORMAL HIGH (ref 11.5–15.5)
WBC: 6.2 10*3/uL (ref 4.0–10.5)
nRBC: 0 % (ref 0.0–0.2)

## 2023-09-01 LAB — BASIC METABOLIC PANEL
Anion gap: 6 (ref 5–15)
BUN: 21 mg/dL (ref 8–23)
CO2: 23 mmol/L (ref 22–32)
Calcium: 8.4 mg/dL — ABNORMAL LOW (ref 8.9–10.3)
Chloride: 109 mmol/L (ref 98–111)
Creatinine, Ser: 0.78 mg/dL (ref 0.44–1.00)
GFR, Estimated: >60 mL/min (ref >60)
Glucose, Bld: 102 mg/dL — ABNORMAL HIGH (ref 70–99)
Potassium: 3.3 mmol/L — ABNORMAL LOW (ref 3.5–5.1)
Sodium: 138 mmol/L (ref 135–145)

## 2023-09-01 MED ORDER — FOSFOMYCIN TROMETHAMINE 3 G PO PACK
3.0000 g | PACK | ORAL | Status: DC
  Administered 2023-09-01: 3 g via ORAL
  Filled 2023-09-01: qty 3

## 2023-09-01 MED ORDER — SODIUM CHLORIDE 0.9 % IV SOLN
1.0000 g | Freq: Three times a day (TID) | INTRAVENOUS | Status: DC
  Administered 2023-09-01: 1 g via INTRAVENOUS
  Filled 2023-09-01: qty 20

## 2023-09-01 NOTE — Care Management Important Message (Signed)
 Important Message  Patient Details  Name: Courtney Mack MRN: [id number] Date of Birth: 1956-05-07   Important Message Given:  Yes - Medicare IM     Cristela Blue, CMA 09/01/2023, 11:36 AM

## 2023-09-01 NOTE — Progress Notes (Signed)
 Progress Note   Patient: Courtney Mack WUJ:[id number] DOB: May 13, 1956 DOA: 08/27/2023     5 DOS: the patient was seen and examined on 09/01/2023   Brief hospital course: HPI on admission 08/27/2023: "ZIAH TURVEY is a 68 y.o. female with medical history significant of left-sided hydronephrosis status post nephrostomy tube placement, recurrent UTI and sepsis due to UTI, hypertension, COPD, history of extensive bilateral PE and DVT (s/p of IVC filter, on lovenox), dCHF, who presents with left flank pain, burning with urination. ..." See H&P for full HPI on admission & ED course.  Pt was admitted for further evaluation and management of recurrent complicated UTI in the setting of left nephrostomy tube, started on IV antibiotics with Cefepime given prior urine cultures growing Pseudomonas, pending urine culture results.    Further hospital course and management as outlined below.   Assessment and Plan:  Complicated UTI in the setting of  Left nephrostomy tube present --- Converted to nephroureteral tube by IR on 08/29/23. History of Pseudomonas UTI's based on prior culture data. 2/20 - Pt reports nephrostomy has not been draining properly, however bag and tubing are draining yellow appearing urine --Initially on empiric IV Cefepime based on prior Pseudomonas UTI's --Urine culture growing ESBL E coli resistant to Cefepime --Also growing Flavobacterium odoratum -- susceptibilities pending --Changed Cefepime to Zosyn for ESBL on 2/23 --Change to Merrem to cover ESBL and Flavobacterium Discussed with ID pharmacist - appreciate input --ID consult for antibiotic recommendations per pt requiest --Added Pyridium x 2 days for dysuria (2/24) --Strict I/O's to capture urine output from nephrostomy --Monitor fever curve, CBC    COPD -stable - not exacerbated --Continue Bronchodilators and PRN Mucinex   Chronic diastolic CHF 2D echo: 11/21/2022 showed EF 60 to 65% with grade 1 diastolic  dysfunction.  Currently appears overall compensated --On home Lasix with hold parameters --Daily weight to monitor volume status    HTN (hypertension): Soft BP's on admission BP's have been labile, soft at times. --On home Lasix - hold if MAP < 65   Dyslipidemia --Continue Crestor   Morbid Obesity - Body mass index is 43.37 kg/m. Complicates overall care and prognosis.  Recommend lifestyle modifications including physical activity and diet for weight loss and overall long-term health.   History of pulmonary embolus (PE) and DVT --Continue full dose Lovenox 30 mg BID       Subjective: Pt sitting on BSC when seen today. She reports dysuria is a little better since yesterday but still persistent.  She's had some 'hot flash' type episodes but otherwise denies fever/chills or feeling more sick. Overall feels better.     Physical Exam: Vitals:   08/31/23 2023 09/01/23 0556 09/01/23 0651 09/01/23 0759  BP: (!) 110/53 125/62  (!) 116/58  Pulse: 76 71  83  Resp: 18 18  18   Temp: 98.9 F (37.2 C) 97.9 F (36.6 C)  98.1 F (36.7 C)  TempSrc:    Oral  SpO2: 96% 97%  96%  Weight:   97.4 kg   Height:       General exam: awake, alert, no acute distress, obese HEENT: wearing glasses, moist mucus membranes, hearing grossly normal  Respiratory system: on room air, normal respiratory effort. Cardiovascular system: RRR, improved non-pitting BLE edema.   Genitourinary system: L nephrostomy tube present Central nervous system: A&O x3. no gross focal neurologic deficits, normal speech Extremities: moves all , no edema, normal tone Skin: dry, intact, normal temperature Psychiatry: normal mood, congruent affect, judgement  and insight appear normal    Data Reviewed:  Notable labs & studies --   BMP with K 3.3, glucose 102, Ca 8.4 otherwise normal  CBC with WBC 3.6, Hbg 10.3 >> 9.4 >> 8.6 >> 8.8  Micro Urine culture ---- ESBL E coli and Flavobacterium odoratum (susceptibility  pending for this organism)   Family Communication: None present. Will attempt to call daughter this afternoon as time allows.   Disposition: Status is: Inpatient Remains inpatient appropriate because: remains on IV antibiotics for UTI pending further improvement of ongoing dysuria and final cultures are still pending.  Likely d/c in 1-2 days.     Planned Discharge Destination: prior living environment    Time spent: 42 minutes  Author: Pennie Banter, DO 09/01/2023 11:46 AM  For on call review [email or web address].

## 2023-09-01 NOTE — Progress Notes (Signed)
 Physical Therapy Treatment Patient Details Name: Courtney Mack MRN: [id number] DOB: 04/13/1956 Today's Date: 09/01/2023   History of Present Illness Pt is a 68 y.o. female  who presents with left flank pain, burning with urination. Admitted with complicated UTI in setting of L nephro tube. PMHx: recurrent PE/DVT s/p IVC (not on AC due to recurrent GI hemorrhage), HFpEF, COPD, left-sided hydronephrosis s/p nephrostomy tube placement, HTN, DM2, HLD, dCHF, recurrent UTI and sepsis due to UTI, COPD.    PT Comments  Pt was pleasant and motivated to participate during the session and put forth good effort throughout. Pt required assist with bed mobility tasks during log roll training primarily for BLE assist.  Once in sitting at the EOB pt presented with good stability and was able to stand with good control from slightly elevated EOB.  Pt was able to amb 12+ feet in various directions near the EOB for safety but ultimately was very steady with no LOB or buckling.  Pt reported only minimal SOB with the effort that resolved quickly once back in sitting. Pt will benefit from continued PT services upon discharge to safely address deficits listed in patient problem list for decreased caregiver assistance and eventual return to PLOF.      If plan is discharge home, recommend the following: Assist for transportation;Help with stairs or ramp for entrance;A lot of help with walking and/or transfers;A lot of help with bathing/dressing/bathroom   Can travel by private vehicle        Equipment Recommendations  None recommended by PT    Recommendations for Other Services       Precautions / Restrictions Precautions Precautions: Fall Precaution/Restrictions Comments: LLQ nephrostomy tube Restrictions Weight Bearing Restrictions Per Provider Order: No     Mobility  Bed Mobility Overal bed mobility: Needs Assistance Bed Mobility: Rolling, Sidelying to Sit   Sidelying to sit: Mod assist        General bed mobility comments: Min to mod A with bed mobility tasks for BLE and trunk control    Transfers Overall transfer level: Needs assistance Equipment used: Rolling walker (2 wheels) Transfers: Sit to/from Stand Sit to Stand: Contact guard assist, From elevated surface           General transfer comment: Good eccentric and concentric control and stability with BUE support/assist    Ambulation/Gait Ambulation/Gait assistance: Contact guard assist Gait Distance (Feet): 12 Feet Assistive device: Rolling walker (2 wheels) Gait Pattern/deviations: Step-through pattern, Decreased step length - right, Decreased step length - left, Trunk flexed Gait velocity: decreased     General Gait Details: Pt able to amb forwards/backwards and side-stepping left/right at EOB x 12+ feet with good stability throughout   Stairs             Wheelchair Mobility     Tilt Bed    Modified Rankin (Stroke Patients Only)       Balance Overall balance assessment: Needs assistance Sitting-balance support: Feet supported Sitting balance-Leahy Scale: Normal     Standing balance support: During functional activity, Bilateral upper extremity supported Standing balance-Leahy Scale: Good                              Communication Communication Communication: No apparent difficulties  Cognition Arousal: Alert Behavior During Therapy: WFL for tasks assessed/performed, Anxious   PT - Cognitive impairments: No apparent impairments  Following commands: Intact      Cueing Cueing Techniques: Verbal cues  Exercises Other Exercises Other Exercises: Log roll training    General Comments        Pertinent Vitals/Pain Pain Assessment Pain Assessment: No/denies pain    Home Living                          Prior Function            PT Goals (current goals can now be found in the care plan section) Progress towards PT  goals: Progressing toward goals    Frequency    Min 1X/week      PT Plan      Co-evaluation              AM-PAC PT "6 Clicks" Mobility   Outcome Measure  Help needed turning from your back to your side while in a flat bed without using bedrails?: A Little Help needed moving from lying on your back to sitting on the side of a flat bed without using bedrails?: A Little Help needed moving to and from a bed to a chair (including a wheelchair)?: A Little Help needed standing up from a chair using your arms (e.g., wheelchair or bedside chair)?: A Little Help needed to walk in hospital room?: A Little Help needed climbing 3-5 steps with a railing? : A Lot 6 Click Score: 17    End of Session Equipment Utilized During Treatment: Gait belt Activity Tolerance: Patient tolerated treatment well Patient left: in bed;with call bell/phone within reach;with bed alarm set (pt declined up in chair) Nurse Communication: Mobility status PT Visit Diagnosis: Muscle weakness (generalized) (M62.81);Unsteadiness on feet (R26.81);Difficulty in walking, not elsewhere classified (R26.2)     Time: 9562-1308 PT Time Calculation (min) (ACUTE ONLY): 29 min  Charges:    $Gait Training: 8-22 mins $Therapeutic Activity: 8-22 mins PT General Charges $$ ACUTE PT VISIT: 1 Visit                    D. Scott Desman Polak PT, DPT 09/01/23, 4:01 PM

## 2023-09-01 NOTE — Progress Notes (Signed)
 Occupational Therapy Treatment Patient Details Name: Courtney Mack MRN: [id number] DOB: 1956-03-16 Today's Date: 09/01/2023   History of present illness Pt is a 68 y.o. female  who presents with left flank pain, burning with urination. Admitted with complicated UTI in setting of L nephro tube. PMHx: recurrent PE/DVT s/p IVC (not on AC due to recurrent GI hemorrhage), HFpEF, COPD, left-sided hydronephrosis s/p nephrostomy tube placement, HTN, DM2, HLD, dCHF, recurrent UTI and sepsis due to UTI, COPD.   OT comments  Courtney Mack was seen for OT treatment on this date. Upon arrival to room pt seated on BSC, agreeable to tx. Pt requires SBA + RW for pericare in standing, MIN A for thoroughness. SBA hand washing in standing and don depends with use of reacher. CGA + RW for ADL t/f ~15 ft. SpO2 98% on RA, HR 140 bpm following activity. Pt making good progress toward goals, will continue to follow POC. Discharge recommendation remains appropriate.       If plan is discharge home, recommend the following:  A little help with walking and/or transfers;A lot of help with bathing/dressing/bathroom;Assistance with cooking/housework;Assist for transportation;Help with stairs or ramp for entrance   Equipment Recommendations  None recommended by OT    Recommendations for Other Services      Precautions / Restrictions Precautions Precautions: Fall Restrictions Weight Bearing Restrictions Per Provider Order: No       Mobility Bed Mobility Overal bed mobility: Needs Assistance Bed Mobility: Sit to Supine       Sit to supine: Mod assist        Transfers Overall transfer level: Needs assistance Equipment used: Rolling walker (2 wheels) Transfers: Sit to/from Stand Sit to Stand: Min assist           General transfer comment: CGA elevated height, MIN A to stabilize RW from bed height     Balance Overall balance assessment: Needs assistance Sitting-balance support: Feet  supported Sitting balance-Leahy Scale: Good     Standing balance support: Single extremity supported, During functional activity Standing balance-Leahy Scale: Fair                             ADL either performed or assessed with clinical judgement   ADL Overall ADL's : Needs assistance/impaired                                       General ADL Comments: SBA + RW for pericare in standing, MIN A for thoroughness. SBA hand washing in standing and don depends with use of reacher. CGA + RW for ADL t/f ~15 ft.      Communication Communication Communication: No apparent difficulties   Cognition Arousal: Alert Behavior During Therapy: WFL for tasks assessed/performed, Anxious Cognition: No apparent impairments                                                 General Comments SpO2 98% on RA, HR 140 bpm following activity    Pertinent Vitals/ Pain       Pain Assessment Pain Assessment: No/denies pain   Frequency  Min 1X/week        Progress Toward Goals  OT Goals(current goals can now be found in  the care plan section)  Progress towards OT goals: Progressing toward goals  Acute Rehab OT Goals OT Goal Formulation: With patient Time For Goal Achievement: 09/12/23 Potential to Achieve Goals: Fair ADL Goals Pt Will Transfer to Toilet: with supervision;bedside commode;ambulating;regular height toilet Pt Will Perform Toileting - Clothing Manipulation and hygiene: with supervision;sit to/from stand;sitting/lateral leans Additional ADL Goal #1: Pt will verbalize/demo implementation of PLB techniques and pacing during ADL performance and transfers to prevent overexertion 3/3 trials.  Plan      Co-evaluation                 AM-PAC OT "6 Clicks" Daily Activity     Outcome Measure   Help from another person eating meals?: None Help from another person taking care of personal grooming?: None Help from another person  toileting, which includes using toliet, bedpan, or urinal?: A Little Help from another person bathing (including washing, rinsing, drying)?: A Little Help from another person to put on and taking off regular upper body clothing?: None Help from another person to put on and taking off regular lower body clothing?: A Little 6 Click Score: 21    End of Session Equipment Utilized During Treatment: Rolling walker (2 wheels)  OT Visit Diagnosis: Other abnormalities of gait and mobility (R26.89)   Activity Tolerance Patient tolerated treatment well   Patient Left in bed;with call bell/phone within reach   Nurse Communication Mobility status        Time: 2956-2130 OT Time Calculation (min): 15 min  Charges: OT General Charges $OT Visit: 1 Visit OT Treatments $Self Care/Home Management : 8-22 mins  Courtney Mack, M.S. OTR/L  09/01/23, 10:09 AM  ascom [phone]

## 2023-09-01 NOTE — Telephone Encounter (Signed)
 Patient Product/process development scientist completed.    The patient is insured through [facility]. Patient has Medicare and is not eligible for a copay card, but may be able to apply for patient assistance or Medicare RX Payment Plan (Patient Must reach out to their plan, if eligible for payment plan), if available.    Ran test claim for fosfomycin 3 g Pack and the current 30 day co-pay is $0.00.   This test claim was processed through [facility]- copay amounts may vary at other pharmacies due to pharmacy/plan contracts, or as the patient moves through the different stages of their insurance plan.     Roland Earl, CPHT Pharmacy Technician III Certified Patient Advocate [facility] Patient Advocate Team Direct Number: [phone]  Fax: [phone]

## 2023-09-02 ENCOUNTER — Other Ambulatory Visit: Payer: Self-pay

## 2023-09-02 DIAGNOSIS — N390 Urinary tract infection, site not specified: Secondary | ICD-10-CM | POA: Diagnosis not present

## 2023-09-02 LAB — URINE CULTURE: Culture: >=100000 — AB

## 2023-09-02 MED ORDER — FOSFOMYCIN TROMETHAMINE 3 G PO PACK
3.0000 g | PACK | ORAL | 0 refills | Status: DC
  Filled 2023-09-02: qty 1, 2d supply, fill #0

## 2023-09-02 MED ORDER — POTASSIUM CHLORIDE CRYS ER 20 MEQ PO TBCR
40.0000 meq | EXTENDED_RELEASE_TABLET | Freq: Once | ORAL | Status: AC
  Administered 2023-09-02: 40 meq via ORAL
  Filled 2023-09-02: qty 2

## 2023-09-02 NOTE — TOC Initial Note (Signed)
 Transition of Care [facility]) - Initial/Assessment Note    Patient Details  Name: Courtney Mack MRN: [id number] Date of Birth: 1956-01-18  Transition of Care [facility]) CM/SW Contact:    Erin Sons, LCSW Phone Number: 09/02/2023, 11:52 AM  Clinical Narrative:                  PT/OT recommending HH. CSW met with pt to discuss recommendations. She does not want HH or OP therapies arranged at this time. She feels she will improve on her own by the time she has a therapy appointment which is what happened last time. She reports having all DME she needs; walker, rollator; 3in1; cane; shower chair. Her daughter will provide transportation at DC.   Expected Discharge Plan: Home/Self Care Barriers to Discharge: No Barriers Identified       Expected Discharge Plan and Services       Living arrangements for the past 2 months: Single Family Home Expected Discharge Date: 09/02/23                                    Prior Living Arrangements/Services Living arrangements for the past 2 months: Single Family Home Lives with:: Adult Children Patient language and need for interpreter reviewed:: Yes        Need for Family Participation in Patient Care: No (Comment) Care giver support system in place?: Yes (comment)   Criminal Activity/Legal Involvement Pertinent to Current Situation/Hospitalization: No - Comment as needed  Activities of Daily Living   ADL Screening (condition at time of admission) Independently performs ADLs?: Yes (appropriate for developmental age) Is the patient deaf or have difficulty hearing?: No Does the patient have difficulty seeing, even when wearing glasses/contacts?: No Does the patient have difficulty concentrating, remembering, or making decisions?: No  Permission Sought/Granted                  Emotional Assessment Appearance:: Appears stated age Attitude/Demeanor/Rapport: Engaged Affect (typically observed): Accepting Orientation: :  Oriented to Self, Oriented to Place, Oriented to  Time, Oriented to Situation Alcohol / Substance Use: Not Applicable Psych Involvement: No (comment)  Admission diagnosis:  Lower urinary tract infection [N39.0] COPD exacerbation (HCC) [J44.1] Complicated UTI (urinary tract infection) [N39.0] Patient Active Problem List   Diagnosis Date Noted   COPD exacerbation (HCC) 08/27/2023   Other pulmonary embolism without acute cor pulmonale (HCC) 08/07/2023   Chronic deep vein thrombosis (DVT) of proximal vein of right lower extremity (HCC) 08/05/2023   Acute pulmonary embolism (HCC) 08/04/2023   Dysuria 06/22/2023   SOB (shortness of breath) 06/18/2023   HTN (hypertension) 06/18/2023   Complicated UTI (urinary tract infection) 05/26/2023   Diarrhea 05/26/2023   Blocked nephrostomy tube with hydronephrosis and hydroureter (HCC) 04/05/2023   Hypertension    Pressure injury of skin 02/11/2023   Severe sepsis with acute organ dysfunction (HCC) 02/10/2023   Obesity (BMI 30-39.9) 11/22/2022   Sepsis due to gram-negative UTI (HCC) 11/21/2022   Acute pyelonephritis 11/21/2022   Dyspnea 11/21/2022   Chest pain 11/21/2022   Hypokalemia 10/25/2022   Chronic obstructive pulmonary disease (COPD) (HCC) 10/24/2022   Iron deficiency anemia 10/24/2022   Symptomatic anemia 08/08/2022   COPD (chronic obstructive pulmonary disease) (HCC) 08/07/2022   Sepsis due to pneumonia (HCC) 06/12/2022   Multifocal pneumonia 06/12/2022   GERD without esophagitis 06/12/2022   Generalized weakness 06/12/2022   Nephrostomy status (HCC) 06/12/2022  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) 04/16/2022   GI bleed 03/14/2022   Acute on chronic anemia 02/24/2022   Hypotension 02/24/2022   Acute cystitis with hematuria    Lymphedema 02/08/2022   Hematuria 12/24/2021   History of pulmonary embolus (PE) 12/24/2021   Chronic radiation cystitis 11/17/2021   Hydronephrosis of left kidney    Gastric  antral vascular ectasia    Complicated urinary tract infection 09/13/2021   AKI (acute kidney injury) (HCC) 09/13/2021   Melena 07/18/2021   Acute on chronic blood loss anemia 07/18/2021   HLD (hyperlipidemia) 06/20/2021   Chronic diastolic CHF (congestive heart failure) (HCC) 06/20/2021   Risk factors for obstructive sleep apnea 11/10/2020   IDA (iron deficiency anemia) 09/21/2020   Endometrial cancer (HCC) 09/13/2020   Recurrent deep vein thrombosis (DVT) (HCC) 09/06/2020   Obesity, Class III, BMI 40-49.9 (morbid obesity) (HCC) 08/23/2020   Dyslipidemia 08/23/2020   PCP:  [facility]:   [id number] [place], [place] - [address] AT [facility] [address] [place] [address] Phone: [phone] Fax: [phone]     Social Drivers of Health (SDOH) Social History: SDOH Screenings   Food Insecurity: No Food Insecurity (08/29/2023)  Housing: Low Risk  (08/29/2023)  Transportation Needs: No Transportation Needs (08/29/2023)  Utilities: Not At Risk (08/29/2023)  Depression (PHQ2-9): Low Risk  (04/11/2022)  Social Connections: Unknown (08/29/2023)  Tobacco Use: Low Risk  (08/29/2023)   SDOH Interventions:     Readmission Risk Interventions    08/07/2023   10:26 AM 07/24/2023   11:14 AM 05/29/2023    9:42 AM  Readmission Risk Prevention Plan  Transportation Screening  Complete   Medication Review (RN Care Manager) Complete Complete   PCP or Specialist appointment within 3-5 days of discharge Complete Complete   HRI or Home Care Consult   Patient refused  SW Recovery Care/Counseling Consult Complete Complete Complete  Palliative Care Screening Not Applicable Not Applicable Not Applicable  Skilled Nursing Facility Not Applicable Not Applicable Not Applicable

## 2023-09-02 NOTE — Discharge Summary (Signed)
 Physician Discharge Summary   Patient: Courtney Mack MRN: [id number] DOB: [birth year]  Admit date:     08/27/2023  Discharge date: 09/02/2023  Discharge Physician: Pennie Banter   PCP: [facility]   Recommendations at discharge:    Follow up with Primary Care in 1-2 weeks Follow up as scheduled with outpatient specialists Repeat CBC, BMP at follow up  Discharge Diagnoses: Principal Problem:   Complicated UTI (urinary tract infection) Active Problems:   Nephrostomy status (HCC)   COPD (chronic obstructive pulmonary disease) (HCC)   Chronic diastolic CHF (congestive heart failure) (HCC)   HTN (hypertension)   HLD (hyperlipidemia)   History of pulmonary embolus (PE)   Obesity, Class III, BMI 40-49.9 (morbid obesity) (HCC)  Resolved Problems:   * No resolved hospital problems. [facility] Course:  HPI on admission 08/27/2023: "Courtney Mack is a 68 y.o. female with medical history significant of left-sided hydronephrosis status post nephrostomy tube placement, recurrent UTI and sepsis due to UTI, hypertension, COPD, history of extensive bilateral PE and DVT (s/p of IVC filter, on lovenox), dCHF, who presents with left flank pain, burning with urination. ..." See H&P for full HPI on admission & ED course.   Pt was admitted for further evaluation and management of recurrent complicated UTI in the setting of left nephrostomy tube, started on IV antibiotics with Cefepime given prior urine cultures growing Pseudomonas, pending urine culture results.     Further hospital course and management as outlined below.   2/25 -- pt doing well, feels better.  No complaints.  She is clinically improved, medically stable, completed antibiotics and requesting discharge home today.   Assessment and Plan:  Complicated UTI in the setting of  Left nephrostomy tube present --- Converted to nephroureteral tube by IR on 08/29/23. History of Pseudomonas UTI's based  on prior culture data. 2/20 - Pt reports nephrostomy has not been draining properly, however bag and tubing are draining yellow appearing urine --Initially on empiric IV Cefepime based on prior Pseudomonas UTI's --Urine culture grew ESBL E coli resistant to Cefepime --Also grew Flavobacterium odoratum --Changed Cefepime to Zosyn for ESBL on 2/23 --Change to Merrem to cover ESBL and Flavobacterium Discussed with ID pharmacist - appreciate input --ID consult for antibiotic recommendations per pt requiest --Added Pyridium x 2 days for dysuria (2/24) --Strict I/O's to capture urine output from nephrostomy --Monitor fever curve, CBC  --ID recommended treating with fosfomycin - completed   COPD -stable - not exacerbated --Continue Bronchodilators and PRN Mucinex   Chronic diastolic CHF 2D echo: 11/21/2022 showed EF 60 to 65% with grade 1 diastolic dysfunction.  Currently appears overall compensated --On home Lasix with hold parameters --Daily weight to monitor volume status     HTN (hypertension): Soft BP's on admission BP's have been labile, soft at times. --On home Lasix - hold if MAP < 65   Dyslipidemia --Continue Crestor   Morbid Obesity - Body mass index is 43.37 kg/m. Complicates overall care and prognosis.  Recommend lifestyle modifications including physical activity and diet for weight loss and overall long-term health.   History of pulmonary embolus (PE) and DVT --Continue full dose Lovenox 30 mg BID           Consultants: none Procedures performed: as above  Disposition: Home Diet recommendation:  Cardiac diet DISCHARGE MEDICATION: Allergies as of 09/02/2023   No Known Allergies      Medication List     STOP taking these medications  budesonide-formoterol 80-4.5 MCG/ACT inhaler Commonly known as: SYMBICORT       TAKE these medications    acetaminophen 500 MG tablet Commonly known as: TYLENOL Take 500 mg by mouth every 6 (six) hours as needed  for mild pain or fever.   albuterol 108 (90 Base) MCG/ACT inhaler Commonly known as: VENTOLIN HFA Inhale 2 puffs into the lungs every 6 (six) hours as needed for wheezing or shortness of breath. What changed: when to take this   Breo Ellipta 100-25 MCG/ACT Aepb Generic drug: fluticasone furoate-vilanterol Inhale 1 puff into the lungs daily.   cyanocobalamin 100 MCG tablet Take 100 mcg by mouth daily.   enoxaparin 30 MG/0.3ML injection Commonly known as: LOVENOX Inject 0.3 mLs (30 mg total) into the skin every 12 (twelve) hours.   FeroSul 325 (65 Fe) MG tablet Generic drug: ferrous sulfate Take 325 mg by mouth daily with breakfast.   fosfomycin 3 g Pack Commonly known as: MONUROL Take 3 g by mouth every other day. Take tomorrow afternoon with lunch.   furosemide 20 MG tablet Commonly known as: LASIX Take 20 mg by mouth daily.   loperamide 2 MG capsule Commonly known as: IMODIUM Take 2 mg by mouth as needed for diarrhea or loose stools.   multivitamin with minerals Tabs tablet Take 1 tablet by mouth daily.   rosuvastatin 10 MG tablet Commonly known as: CRESTOR TAKE 1 TABLET(10 MG) BY MOUTH DAILY   simethicone 80 MG chewable tablet Commonly known as: MYLICON Chew 1 tablet (80 mg total) by mouth every 6 (six) hours as needed for flatulence.   trimethoprim 100 MG tablet Commonly known as: TRIMPEX Take 1 tablet (100 mg total) by mouth at bedtime.   vitamin C 1000 MG tablet Take 1,000 mg by mouth daily.        Follow-up Information     Center, [facility]. Go on 10/10/2023.   Specialty: General Practice Why: @ 8:40am Contact information: [address] [place] [address] [phone]         Courtney Altes, MD. Call.   Specialty: Urology Why: Call to schedule follow up or go to next appt as scheduled. Contact information: [address] [place] [address] [phone]                 Discharge Exam: Courtney Mack Weights   08/30/23 0500 08/31/23 0500 09/01/23 0651  Weight: 95.3 kg 95.9 kg 97.4 kg   General exam: awake, alert, no acute distress HEENT: moist mucus membranes, hearing grossly normal  Respiratory system: on room air, normal respiratory effort. Cardiovascular system: normal S1/S2,  RRR Gastrointestinal system: soft, NT, ND, no HSM felt, +bowel sounds. Central nervous system: A&O x 3. no gross focal neurologic deficits, normal speech Extremities: moves all, no edema, normal tone Skin: dry, intact, normal temperature Psychiatry: normal mood, congruent affect, judgement and insight appear normal   Condition at discharge: stable  The results of significant diagnostics from this hospitalization (including imaging, microbiology, ancillary and laboratory) are listed below for reference.   Imaging Studies: IR CONVERT LEFT NEPHROSTOMY TO NEPHROURETERAL CATH Result Date: 08/29/2023 INDICATION: 68 year old female with a chronic indwelling left percutaneous nephrostomy tube. She is interested in attempting nephroureteral tube conversion to make her nephrostomy tube less cumbersome. EXAM: IR CONVERT NEPHROSTOMY TO NEPHROURETERAL CATH LEFT COMPARISON:  None Available. MEDICATIONS: None. ANESTHESIA/SEDATION: None. CONTRAST:  20mL OMNIPAQUE IOHEXOL 300 MG/ML SOLN - administered into the collecting system(s) FLUOROSCOPY TIME:  Radiation  exposure index: 40 mGy reference air kerma COMPLICATIONS: None immediate. PROCEDURE: Informed written consent was obtained from the patient after a thorough discussion of the procedural risks, benefits and alternatives. All questions were addressed. Maximal Sterile Barrier Technique was utilized including caps, mask, sterile gowns, sterile gloves, sterile drape, hand hygiene and skin antiseptic. A timeout was performed prior to the initiation of the procedure. A diagnostic antegrade nephroureteral g was first performed through the existing nephrostomy  tube. No evidence of hydronephrosis. Contrast material hangs up at the level of the sacral hiatus for a few minutes before ultimately passing through and into the bladder. There is a mild moderate length stenosis of the distal ureter but contrast material does pass through into the bladder. The decision was made to proceed with nephroureteral tube conversion. The existing catheter was transected and removed over an Amplatz wire. Using an angled catheter and the wire, the catheter was carefully navigated down the ureter and into the bladder. The ureteral length was measured. A 10 French 24 cm length nephroureteral tube was selected and advanced over the wire. The distal loop was formed in the bladder. The proximal loop was formed in the renal collecting system. Contrast injection demonstrates patency of both the proximal and distal loops. The catheter was capped. IMPRESSION: 1. Successful conversion to a 24 cm 10 French nephroureteral tube. Locking loops are present within the bladder and the renal pelvis. 2. Return to interventional Radiology in 6 weeks for tube check and change. Electronically Signed   By: Malachy Moan M.D.   On: 08/29/2023 14:50   DG Chest Portable 1 View Result Date: 08/27/2023 CLINICAL DATA:  Shortness of breath. EXAM: PORTABLE CHEST 1 VIEW COMPARISON:  08/04/2023. FINDINGS: The heart size and mediastinal contours are within normal limits. No evidence of edema, focal consolidation, pleural effusion, or pneumothorax. No acute osseous abnormality. IMPRESSION: No acute cardiopulmonary findings. Electronically Signed   By: Hart Robinsons M.D.   On: 08/27/2023 19:03    Microbiology: Results for orders placed or performed during the hospital encounter of 08/27/23  Resp panel by RT-PCR (RSV, Flu A&B, Covid) Anterior Nasal Swab     Status: None   Collection Time: 08/27/23  6:20 PM   Specimen: Anterior Nasal Swab  Result Value Ref Range Status   SARS Coronavirus 2 by RT PCR NEGATIVE  NEGATIVE Final    Comment: (NOTE) SARS-CoV-2 target nucleic acids are NOT DETECTED.  The SARS-CoV-2 RNA is generally detectable in upper respiratory specimens during the acute phase of infection. The lowest concentration of SARS-CoV-2 viral copies this assay can detect is 138 copies/mL. A negative result does not preclude SARS-Cov-2 infection and should not be used as the sole basis for treatment or other patient management decisions. A negative result may occur with  improper specimen collection/handling, submission of specimen other than nasopharyngeal swab, presence of viral mutation(s) within the areas targeted by this assay, and inadequate number of viral copies(<138 copies/mL). A negative result must be combined with clinical observations, patient history, and epidemiological information. The expected result is Negative.  Fact Sheet for Patients:  BloggerCourse.com  Fact Sheet for Healthcare Providers:  SeriousBroker.it  This test is no t yet approved or cleared by the [place] FDA and  has been authorized for detection and/or diagnosis of SARS-CoV-2 by FDA under an Emergency Use Authorization (EUA). This EUA will remain  in effect (meaning this test can be used) for the duration of the COVID-19 declaration under Section 564(b)(1) of the Act, 21  U.S.C.section 360bbb-3(b)(1), unless the authorization is terminated  or revoked sooner.       Influenza A by PCR NEGATIVE NEGATIVE Final   Influenza B by PCR NEGATIVE NEGATIVE Final    Comment: (NOTE) The Xpert Xpress SARS-CoV-2/FLU/RSV plus assay is intended as an aid in the diagnosis of influenza from Nasopharyngeal swab specimens and should not be used as a sole basis for treatment. Nasal washings and aspirates are unacceptable for Xpert Xpress SARS-CoV-2/FLU/RSV testing.  Fact Sheet for Patients: BloggerCourse.com  Fact Sheet for Healthcare  Providers: SeriousBroker.it  This test is not yet approved or cleared by the [place] FDA and has been authorized for detection and/or diagnosis of SARS-CoV-2 by FDA under an Emergency Use Authorization (EUA). This EUA will remain in effect (meaning this test can be used) for the duration of the COVID-19 declaration under Section 564(b)(1) of the Act, 21 U.S.C. section 360bbb-3(b)(1), unless the authorization is terminated or revoked.     Resp Syncytial Virus by PCR NEGATIVE NEGATIVE Final    Comment: (NOTE) Fact Sheet for Patients: BloggerCourse.com  Fact Sheet for Healthcare Providers: SeriousBroker.it  This test is not yet approved or cleared by the [place] FDA and has been authorized for detection and/or diagnosis of SARS-CoV-2 by FDA under an Emergency Use Authorization (EUA). This EUA will remain in effect (meaning this test can be used) for the duration of the COVID-19 declaration under Section 564(b)(1) of the Act, 21 U.S.C. section 360bbb-3(b)(1), unless the authorization is terminated or revoked.  Performed at [facility], [address], [place], [place] [address]   Urine Culture (for pregnant, neutropenic or urologic patients or patients with an indwelling urinary catheter)     Status: Abnormal   Collection Time: 08/27/23  9:32 PM   Specimen: Urine, Random  Result Value Ref Range Status   Specimen Description   Final    URINE, RANDOM Performed at [facility], [address], [place], [place] [address]    Special Requests   Final    NONE Performed at [facility], [address] [place], [place] [address]    Culture (A)  Final    >=100,000 COLONIES/mL ESCHERICHIA COLI Confirmed Extended Spectrum Beta-Lactamase Producer (ESBL).  In bloodstream infections from ESBL organisms, carbapenems are preferred over piperacillin/tazobactam. They are shown  to have a lower risk of mortality. >=100,000 COLONIES/mL FLAVOBACTERIUM ODORATUM MULTI-DRUG RESISTANT ORGANISM CRITICAL RESULT CALLED TO, READ BACK BY AND VERIFIED WITH: pharmd [place] at 0834 on 09/02/2023 by [email or web address]. Performed at [facility], [address] [place], [place] [address]    Report Status 09/02/2023 FINAL  Final   Organism ID, Bacteria ESCHERICHIA COLI (A)  Final   Organism ID, Bacteria FLAVOBACTERIUM ODORATUM (A)  Final      Susceptibility   Escherichia coli - MIC*    AMPICILLIN >=32 RESISTANT Resistant     CEFAZOLIN >=64 RESISTANT Resistant     CEFEPIME 16 RESISTANT Resistant     CEFTRIAXONE >=64 RESISTANT Resistant     CIPROFLOXACIN >=4 RESISTANT Resistant     GENTAMICIN <=1 SENSITIVE Sensitive     IMIPENEM <=0.25 SENSITIVE Sensitive     NITROFURANTOIN <=16 SENSITIVE Sensitive     TRIMETH/SULFA >=320 RESISTANT Resistant     AMPICILLIN/SULBACTAM >=32 RESISTANT Resistant     PIP/TAZO 16 SENSITIVE Sensitive ug/mL    * >=100,000 COLONIES/mL ESCHERICHIA COLI   Flavobacterium odoratum - MIC*    CIPROFLOXACIN >=4 RESISTANT Resistant     GENTAMICIN >=16 RESISTANT  Resistant     IMIPENEM 2 SENSITIVE Sensitive     TRIMETH/SULFA >=320 RESISTANT Resistant     PIP/TAZO >=128 RESISTANT Resistant ug/mL    * >=100,000 COLONIES/mL FLAVOBACTERIUM ODORATUM  MRSA Next Gen by PCR, Nasal     Status: None   Collection Time: 08/29/23 11:05 AM   Specimen: Nasal Mucosa; Nasal Swab  Result Value Ref Range Status   MRSA by PCR Next Gen NOT DETECTED NOT DETECTED Final    Comment: (NOTE) The GeneXpert MRSA Assay (FDA approved for NASAL specimens only), is one component of a comprehensive MRSA colonization surveillance program. It is not intended to diagnose MRSA infection nor to guide or monitor treatment for MRSA infections. Test performance is not FDA approved in patients less than 72 years old. Performed at [facility], [address] [place], [place] [address]      Labs: CBC: No results for input(s): "WBC", "NEUTROABS", "HGB", "HCT", "MCV", "PLT" in the last 168 hours.  Basic Metabolic Panel: No results for input(s): "NA", "K", "CL", "CO2", "GLUCOSE", "BUN", "CREATININE", "CALCIUM", "MG", "PHOS" in the last 168 hours.  Liver Function Tests: No results for input(s): "AST", "ALT", "ALKPHOS", "BILITOT", "PROT", "ALBUMIN" in the last 168 hours. CBG: No results for input(s): "GLUCAP" in the last 168 hours.  Discharge time spent: greater than 30 minutes.  Signed: Pennie Banter, DO [facility] 09/13/2023

## 2023-09-08 ENCOUNTER — Ambulatory Visit: Payer: 59 | Admitting: Physician Assistant

## 2023-09-10 ENCOUNTER — Encounter: Payer: Self-pay | Admitting: Physician Assistant

## 2023-09-10 ENCOUNTER — Ambulatory Visit: Payer: 59 | Admitting: Physician Assistant

## 2023-10-15 ENCOUNTER — Other Ambulatory Visit: Payer: Self-pay | Admitting: Interventional Radiology

## 2023-10-15 DIAGNOSIS — N1330 Unspecified hydronephrosis: Secondary | ICD-10-CM

## 2023-10-21 ENCOUNTER — Other Ambulatory Visit: Payer: Self-pay | Admitting: Radiology

## 2023-10-21 NOTE — Progress Notes (Signed)
 Patient for IR LT Neph Tube Exchange on Wed 10/22/23, I called and spoke with the patient on the phone and gave pre-procedure instructions. Pt was made aware to be here at 2p and check in at the new entrance. Pt stated understanding. Called 10/21/23

## 2023-10-22 ENCOUNTER — Ambulatory Visit
Admission: RE | Admit: 2023-10-22 | Discharge: 2023-10-22 | Disposition: A | Discharge status: Home / Self Care | Source: Ambulatory Visit | Attending: Interventional Radiology | Admitting: Interventional Radiology

## 2023-10-22 ENCOUNTER — Ambulatory Visit: Admitting: Physician Assistant

## 2023-10-23 NOTE — Progress Notes (Signed)
 Patient for IR LT Neph Tube Exchange on Mon 10/27/23, I called and spoke with the patient on the phone and gave pre-procedure instructions. Pt was made aware to be here at 2p and check in at the new entrance. Pt stated understanding.  Called 10/22/23

## 2023-10-27 ENCOUNTER — Ambulatory Visit
Admission: RE | Admit: 2023-10-27 | Discharge: 2023-10-27 | Disposition: A | Discharge status: Home / Self Care | Source: Ambulatory Visit | Attending: Interventional Radiology | Admitting: Interventional Radiology

## 2023-10-29 ENCOUNTER — Inpatient Hospital Stay: Admission: RE | Admit: 2023-10-29 | Source: Ambulatory Visit | Admitting: Radiology

## 2023-11-05 ENCOUNTER — Ambulatory Visit: Admitting: Physician Assistant

## 2023-11-10 ENCOUNTER — Other Ambulatory Visit: Payer: Self-pay | Admitting: Radiology

## 2023-11-11 ENCOUNTER — Other Ambulatory Visit: Payer: Self-pay | Admitting: Radiology

## 2023-11-12 ENCOUNTER — Ambulatory Visit
Admission: RE | Admit: 2023-11-12 | Discharge: 2023-11-12 | Disposition: A | Discharge status: Home / Self Care | Source: Ambulatory Visit | Attending: Interventional Radiology | Admitting: Interventional Radiology

## 2023-11-14 ENCOUNTER — Other Ambulatory Visit: Payer: Self-pay | Admitting: Radiology

## 2023-11-14 NOTE — Progress Notes (Signed)
 Patient for IR LT Neph Tube Exchange on Mon 11/17/23, I called and spoke with the patient on the phone and gave pre-procedure instructions. Pt was made aware to be here at 2:30p and check in at the new entrance. Pt stated understanding.  Called 11/12/23

## 2023-11-15 ENCOUNTER — Other Ambulatory Visit: Payer: Self-pay | Admitting: Family

## 2023-11-17 ENCOUNTER — Ambulatory Visit: Admitting: Physician Assistant

## 2023-11-17 ENCOUNTER — Ambulatory Visit
Admission: RE | Admit: 2023-11-17 | Discharge: 2023-11-17 | Disposition: A | Discharge status: Home / Self Care | Source: Ambulatory Visit | Attending: Interventional Radiology | Admitting: Interventional Radiology

## 2023-11-19 ENCOUNTER — Ambulatory Visit: Admitting: Physician Assistant

## 2023-11-21 ENCOUNTER — Telehealth: Payer: Self-pay | Admitting: Family

## 2023-11-21 NOTE — Telephone Encounter (Signed)
 Called to confirm/remind patient of their appointment [facility] on 11/24[facility].   Appointment:   [x] Confirmed  [] Left mess   [] No answer/No voice mail  [] VM Full/unable to leave message  [] Phone not in service  Patient reminded to bring all medications and/or complete list.  Confirmed patient has transportation. Gave directions, instructed to utilize valet parking.

## 2023-11-24 ENCOUNTER — Encounter: Admitting: Family

## 2023-11-26 ENCOUNTER — Ambulatory Visit: Admission: RE | Admit: 2023-11-26 | Source: Ambulatory Visit | Admitting: Radiology

## 2023-12-02 ENCOUNTER — Telehealth: Payer: Self-pay | Admitting: Family

## 2023-12-02 NOTE — Telephone Encounter (Signed)
 Called to confirm/remind patient of their appointment at [facility] on 12/03/23.   Appointment:   [] Confirmed  [x] Left mess   [] No answer/No voice mail  [] VM Full/unable to leave message  [] Phone not in service  Patient reminded to bring all medications and/or complete list.  Confirmed patient has transportation. Gave directions, instructed to utilize valet parking.

## 2023-12-02 NOTE — Progress Notes (Deleted)
 Advanced Heart Failure Clinic Note   PCP: [facility], [facility]  Primary Cardiologist: Belva Boyden, MD (last seen 03/23)  Chief Complaint:   HPI:  Courtney Mack is a 68 y/o female with a history of DM, endometrial cancer, GERD, hyperlipidemia, HTN, COPD, recurrent DVT, extensive bilateral PE s/p embolectomy and IVC placement, lymphedema, ureteral stricture, anemia, thrombocytopenia, tobacco use and chronic heart failure.   Echo 09/12/21: EF of 55-60% along with mild LVH/ LAE & mild MR. Echo 02/25/22: EF of 65-70%  Admitted 10/24/22 due to SOB d/t COPD exacerbation. Treated with steroids and antibiotics.   Admitted 11/20/22 due to acute shortness of breath. Admitted on fluids, antibiotics for sepsis. Cardiology consulted for chest pain.  Transfused 1 unit PRBC's and received 2 doses of IV iron . Echo 11/21/22: EF 60-65% along with Grade I DD and normal PA pressure of 17.6 mmHg. Gram-negative UTI treated with 3 days of intravenous antibiotic. Received 3 days total of IV antibiotics. Percutaneous nephrostomy tube was exchanged due to acute pyelonephritis.   Was in the ED 12/24/22 due to chest pain and shortness of breath and funny feeling in her head. Symptoms improved and she was released.   Since last visit 07/24, she has been admitted 7 times with 5 of those time being due to UTI. Most recent admission was 08/27/23.     She presents today for a HF follow-up visit with a chief complaint of    ROS: All systems negative except what is listed in HPI, PMH and Problem List    Past Medical History:  Diagnosis Date   Acute bilateral deep vein thrombosis (DVT) of femoral veins (HCC) 01/02/2021   Acute massive pulmonary embolism (HCC) 08/25/2020   Last Assessment & Plan:  Formatting of this note might be different from the original. Post vena caval filter and on xarelto  and O2   Anemia    ARF (acute respiratory failure) (HCC)    Cellulitis of left lower leg 06/20/2021   CHF  (congestive heart failure) (HCC)    COVID-19    Diabetes mellitus without complication (HCC)    DVT (deep venous thrombosis) (HCC) 09/06/2020   Endometrial cancer (HCC)    GAVE (gastric antral vascular ectasia)    GERD (gastroesophageal reflux disease)    High cholesterol    Hx of blood clots    Hyperlipidemia    Hypertension    IDA (iron  deficiency anemia) 09/21/2020   Obesity    Pneumonia 06/11/2022   recovered   Pressure injury of skin 06/21/2021   Pulmonary embolism (HCC)    Sepsis (HCC)    Thrombocytopenia (HCC)    Urinary tract infection 09/13/2021    Current Outpatient Medications  Medication Sig Dispense Refill   acetaminophen  (TYLENOL ) 500 MG tablet Take 500 mg by mouth every 6 (six) hours as needed for mild pain or fever.     albuterol  (VENTOLIN  HFA) 108 (90 Base) MCG/ACT inhaler Inhale 2 puffs into the lungs every 6 (six) hours as needed for wheezing or shortness of breath. (Patient taking differently: Inhale 2 puffs into the lungs every 4 (four) hours as needed for wheezing or shortness of breath.) 8 g 0   Ascorbic Acid  (VITAMIN C ) 1000 MG tablet Take 1,000 mg by mouth daily.     cyanocobalamin  100 MCG tablet Take 100 mcg by mouth daily.     enoxaparin  (LOVENOX ) 30 MG/0.3ML injection Inject 0.3 mLs (30 mg total) into the skin every 12 (twelve) hours. 18 mL 0  FEROSUL 325 (65 Fe) MG tablet Take 325 mg by mouth daily with breakfast.     fluticasone  furoate-vilanterol (BREO ELLIPTA ) 100-25 MCG/ACT AEPB Inhale 1 puff into the lungs daily.     fosfomycin (MONUROL ) 3 g PACK Take 3 g by mouth every other day. Take tomorrow afternoon with lunch. 1 each 0   furosemide  (LASIX ) 20 MG tablet Take 20 mg by mouth daily.     loperamide  (IMODIUM ) 2 MG capsule Take 2 mg by mouth as needed for diarrhea or loose stools.     Multiple Vitamin (MULTIVITAMIN WITH MINERALS) TABS tablet Take 1 tablet by mouth daily.     rosuvastatin  (CRESTOR ) 10 MG tablet TAKE 1 TABLET(10 MG) BY MOUTH DAILY 30  tablet 0   simethicone  (MYLICON) 80 MG chewable tablet Chew 1 tablet (80 mg total) by mouth every 6 (six) hours as needed for flatulence. 30 tablet 0   trimethoprim  (TRIMPEX ) 100 MG tablet Take 1 tablet (100 mg total) by mouth at bedtime. 30 tablet 0   No current facility-administered medications for this visit.    No Known Allergies    Social History   Socioeconomic History   Marital status: Single    Spouse name: Not on file   Number of children: Not on file   Years of education: Not on file   Highest education level: Not on file  Occupational History   Not on file  Tobacco Use   Smoking status: Never   Smokeless tobacco: Never   Tobacco comments:    smoked 2-4 cigarettes for about 2-3 weeks   Vaping Use   Vaping status: Never Used  Substance and Sexual Activity   Alcohol  use: Not Currently   Drug use: Never   Sexual activity: Not Currently  Other Topics Concern   Not on file  Social History Narrative   Not on file   Social Drivers of Health   Financial Resource Strain: Not on file  Food Insecurity: No Food Insecurity (08/29/2023)   Hunger Vital Sign    Worried About Running Out of Food in the Last Year: Never true    Ran Out of Food in the Last Year: Never true  Transportation Needs: No Transportation Needs (08/29/2023)   PRAPARE - Administrator, Civil Service (Medical): No    Lack of Transportation (Non-Medical): No  Physical Activity: Not on file  Stress: Not on file  Social Connections: Unknown (08/29/2023)   Social Connection and Isolation Panel [NHANES]    Frequency of Communication with Friends and Family: More than three times a week    Frequency of Social Gatherings with Friends and Family: Three times a week    Attends Religious Services: Never    Active Member of Clubs or Organizations: No    Attends Banker Meetings: Never    Marital Status: Patient declined  Intimate Partner Violence: Not At Risk (08/29/2023)   Humiliation,  Afraid, Rape, and Kick questionnaire    Fear of Current or Ex-Partner: No    Emotionally Abused: No    Physically Abused: No    Sexually Abused: No      Family History  Problem Relation Age of Onset   Hypertension Mother    Diabetes Mother    Diabetes Father    Hypertension Father    High Cholesterol Father    Congestive Heart Failure Father    Breast cancer Cousin        PHYSICAL EXAM: General:  Well appearing. No  respiratory difficulty HEENT: normal Neck: supple. no JVD. Carotids 2+ bilat; no bruits. No lymphadenopathy or thyromegaly appreciated. Cor: PMI nondisplaced. Regular rate & rhythm. No rubs, gallops or murmurs. Lungs: clear Abdomen: soft, nontender, nondistended. No hepatosplenomegaly. No bruits or masses. Good bowel sounds. Extremities: no cyanosis, clubbing, rash, edema Neuro: alert & oriented x 3, cranial nerves grossly intact. moves all 4 extremities w/o difficulty. Affect pleasant.  ECG:   ASSESSMENT & PLAN:   1: NICM with preserved ejection fraction- - suspect due to HTN - NYHA class II - euvolemic today - weighing daily; reminded to call for an overnight weight gain of > 2 pounds or a weekly weight gain of > 5 pounds - weight 190 pounds from last visit here 10 months ago  - Echo 09/12/21: EF of 55-60% along with mild LVH/ LAE & mild MR.  - Echo 02/25/22: EF of 65-70%.  - Echo 11/21/22: EF 60-65% along with Grade I DD and normal PA pressure of 17.6 mmHg. - not adding salt except on rare occasions;  - saw cardiology Jerelene Monday) 03/23; most recently during recent admission 05/24; put on her AVS for them to call and get f/u scheduled - continue furosemide  20mg  daily with additional 20mg  if needed - recurrent UTI's so not a candidate for SGLT2 - reviewed the importance of keeping her total daily fluid intake to 60-64oz and that, currently, she's drinking >100 oz daily - BNP 08/27/23 was 59.2   2: HTN- - BP  - follows with PCP at [facility]  - BMP 09/01/23 reviewed: sodium 138, potassium 3.3, creatinine 0.78 and GFR >60   3: DM- - A1c 08/06/23 was 4.5%   4: Anemia- - hemoglobin 09/01/23 was 8.8 - saw hematology Wilhelmenia Harada) 06/24  5: Left lower extremity DVT- - has IVC in place - unable to tolerate anticoagulation due to high bleeding risk   6: Recurrent UTI- - saw urology Cherylene Corrente) 10/23 - underwent nephrogram and nephrostomy tube exchange in the past - not a candidate for SGLT2    Charlette Console, FNP 12/02/23 12/02/23

## 2023-12-03 ENCOUNTER — Telehealth: Payer: Self-pay | Admitting: Family

## 2023-12-03 ENCOUNTER — Encounter: Admitting: Family

## 2023-12-03 NOTE — Telephone Encounter (Signed)
 Patient did not show for her Heart Failure Clinic appointment on 12/03/23.

## 2023-12-04 ENCOUNTER — Telehealth: Payer: Self-pay | Admitting: Family

## 2023-12-04 NOTE — Telephone Encounter (Signed)
 Called to confirm/remind patient of their appointment at the Advanced Heart Failure Clinic on 12/05/23.   Appointment:   [x] Confirmed  [] Left mess   [] No answer/No voice mail  [] VM Full/unable to leave message  [] Phone not in service  Patient reminded to bring all medications and/or complete list.  Confirmed patient has transportation. Gave directions, instructed to utilize valet parking.

## 2023-12-05 ENCOUNTER — Telehealth: Payer: Self-pay | Admitting: Family

## 2023-12-05 ENCOUNTER — Encounter: Admitting: Family

## 2023-12-05 NOTE — Progress Notes (Signed)
 Patient for IR LT Neph Exchange on Mon 12/08/23, I called and spoke with the patient on the phone and gave pre-procedure instructions. Pt was made aware to be here at 12p and check in at the new entrance. Pt stated understanding.  Called 12/05/23

## 2023-12-05 NOTE — Telephone Encounter (Signed)
 Patient did not show for her Heart Failure Clinic appointment on 12/05/23. This is the 6th appointment she has missed.

## 2023-12-08 ENCOUNTER — Ambulatory Visit
Admission: RE | Admit: 2023-12-08 | Discharge: 2023-12-08 | Disposition: A | Discharge status: Home / Self Care | Source: Ambulatory Visit | Attending: Interventional Radiology | Admitting: Interventional Radiology

## 2023-12-17 ENCOUNTER — Other Ambulatory Visit: Payer: Self-pay | Admitting: Family

## 2024-01-07 ENCOUNTER — Telehealth: Payer: Self-pay | Admitting: Family

## 2024-01-07 NOTE — Telephone Encounter (Signed)
 Called to confirm/remind patient of their appointment at the Advanced Heart Failure Clinic on 01/08/24.   Appointment:   [] Confirmed  [x] Left mess   [] No answer/No voice mail  [] VM Full/unable to leave message  [] Phone not in service  Patient reminded to bring all medications and/or complete list.  Confirmed patient has transportation. Gave directions, instructed to utilize valet parking.

## 2024-01-07 NOTE — Progress Notes (Deleted)
 Advanced Heart Failure Clinic Note   Referring Physician: PCP: [facility], [facility] PCP-Cardiologist: Courtney Lunger, MD   Chief Complaint:  HPI:  PCP: [facility], Courtney Courtney [facility]  Primary Cardiologist: Courtney [name], MD (last seen 03/23)  HPI:  Courtney Courtney is a 68 y/o female with a history of DM, endometrial cancer, GERD, hyperlipidemia, HTN, COPD, recurrent DVT, extensive bilateral PE s/p embolectomy and IVC placement, lymphedema, ureteral stricture, anemia, thrombocytopenia, tobacco use and chronic heart failure.   Admitted 10/24/22 due to SOB d/t COPD exacerbation. Treated with steroids and antibiotics. Admitted 11/20/22 due to acute shortness of breath. Admitted on fluids, antibiotics for sepsis. Cardiology consulted for chest pain.  Transfused 1 unit PRBC's and received 2 doses of IV iron . Gram-negative UTI treated with 3 days of intravenous antibiotic. Received 3 days total of IV antibiotics. Percutaneous nephrostomy tube was exchanged due to acute pyelonephritis. Was in the ED 12/24/22 due to chest pain and shortness of breath and funny feeling in her head. Symptoms improved and she was released.   Echo 09/12/21: EF of 55-60% along with mild LVH/ LAE & mild MR. Echo 02/25/22: EF of 65-70% Echo 11/21/22: EF 60-65% along with Grade I DD and normal PA pressure of 17.6 mmHg.    She presents today for a HF follow-up visit with a chief complaint of minimal SOB with moderate exertion. Chronic in nature but worse with hot/ humid weather, Has associated fatigue, pedal edema and gradual weight gain along with this. Denies chest pain, cough, palpitations, dizziness, abdominal distention or difficulty sleeping.  Accidentally threw away the bag that had her crestor / furosemide  in it so wants to see if I can send in new RX's for both of these.   Weight at home stays around 190     Review of Systems: [y] = yes, [ ]  = no   General: Weight gain [ ] ; Weight loss [ ] ;  Anorexia [ ] ; Fatigue [ ] ; Fever [ ] ; Chills [ ] ; Weakness [ ]   Cardiac: Chest pain/pressure [ ] ; Resting SOB [ ] ; Exertional SOB [ ] ; Orthopnea [ ] ; Pedal Edema [ ] ; Palpitations [ ] ; Syncope [ ] ; Presyncope [ ] ; Paroxysmal nocturnal dyspnea[ ]   Pulmonary: Cough [ ] ; Wheezing[ ] ; Hemoptysis[ ] ; Sputum [ ] ; Snoring [ ]   GI: Vomiting[ ] ; Dysphagia[ ] ; Melena[ ] ; Hematochezia [ ] ; Heartburn[ ] ; Abdominal pain [ ] ; Constipation [ ] ; Diarrhea [ ] ; BRBPR [ ]   GU: Hematuria[ ] ; Dysuria [ ] ; Nocturia[ ]   Vascular: Pain in legs with walking [ ] ; Pain in feet with lying flat [ ] ; Non-healing sores [ ] ; Stroke [ ] ; TIA [ ] ; Slurred speech [ ] ;  Neuro: Headaches[ ] ; Vertigo[ ] ; Seizures[ ] ; Paresthesias[ ] ;Blurred vision [ ] ; Diplopia [ ] ; Vision changes [ ]   Ortho/Skin: Arthritis [ ] ; Joint pain [ ] ; Muscle pain [ ] ; Joint swelling [ ] ; Back Pain [ ] ; Rash [ ]   Psych: Depression[ ] ; Anxiety[ ]   Heme: Bleeding problems [ ] ; Clotting disorders [ ] ; Anemia [ ]   Endocrine: Diabetes [ ] ; Thyroid dysfunction[ ]    Past Medical History:  Diagnosis Date   Acute bilateral deep vein thrombosis (DVT) of femoral veins (HCC) 01/02/2021   Acute massive pulmonary embolism (HCC) 08/25/2020   Last Assessment & Plan:  Formatting of this note might be different from the original. Post vena caval filter and on xarelto  and O2   Anemia    ARF (acute respiratory failure) (HCC)  Cellulitis of left lower leg 06/20/2021   CHF (congestive heart failure) (HCC)    COVID-19    Diabetes mellitus without complication (HCC)    DVT (deep venous thrombosis) (HCC) 09/06/2020   Endometrial cancer (HCC)    GAVE (gastric antral vascular ectasia)    GERD (gastroesophageal reflux disease)    High cholesterol    Hx of blood clots    Hyperlipidemia    Hypertension    IDA (iron  deficiency anemia) 09/21/2020   Obesity    Pneumonia 06/11/2022   recovered   Pressure injury of skin 06/21/2021   Pulmonary embolism (HCC)    Sepsis (HCC)     Thrombocytopenia (HCC)    Urinary tract infection 09/13/2021    Current Outpatient Medications  Medication Sig Dispense Refill   acetaminophen  (TYLENOL ) 500 MG tablet Take 500 mg by mouth every 6 (six) hours as needed for mild pain or fever.     albuterol  (VENTOLIN  HFA) 108 (90 Base) MCG/ACT inhaler Inhale 2 puffs into the lungs every 6 (six) hours as needed for wheezing or shortness of breath. (Patient taking differently: Inhale 2 puffs into the lungs every 4 (four) hours as needed for wheezing or shortness of breath.) 8 g 0   Ascorbic Acid  (VITAMIN C ) 1000 MG tablet Take 1,000 mg by mouth daily.     cyanocobalamin  100 MCG tablet Take 100 mcg by mouth daily.     enoxaparin  (LOVENOX ) 30 MG/0.3ML injection Inject 0.3 mLs (30 mg total) into the skin every 12 (twelve) hours. 18 mL 0   FEROSUL 325 (65 Fe) MG tablet Take 325 mg by mouth daily with breakfast.     fluticasone  furoate-vilanterol (BREO ELLIPTA ) 100-25 MCG/ACT AEPB Inhale 1 puff into the lungs daily.     fosfomycin (MONUROL ) 3 g PACK Take 3 g by mouth every other day. Take tomorrow afternoon with lunch. 1 each 0   furosemide  (LASIX ) 20 MG tablet Take 20 mg by mouth daily.     loperamide  (IMODIUM ) 2 MG capsule Take 2 mg by mouth as needed for diarrhea or loose stools.     Multiple Vitamin (MULTIVITAMIN WITH MINERALS) TABS tablet Take 1 tablet by mouth daily.     rosuvastatin  (CRESTOR ) 10 MG tablet TAKE 1 TABLET(10 MG) BY MOUTH DAILY 30 tablet 6   simethicone  (MYLICON) 80 MG chewable tablet Chew 1 tablet (80 mg total) by mouth every 6 (six) hours as needed for flatulence. 30 tablet 0   trimethoprim  (TRIMPEX ) 100 MG tablet Take 1 tablet (100 mg total) by mouth at bedtime. 30 tablet 0   No current facility-administered medications for this visit.    No Known Allergies    Social History   Socioeconomic History   Marital status: Single    Spouse name: Not on file   Number of children: Not on file   Years of education: Not on  file   Highest education level: Not on file  Occupational History   Not on file  Tobacco Use   Smoking status: Never   Smokeless tobacco: Never   Tobacco comments:    smoked 2-4 cigarettes for about 2-3 weeks   Vaping Use   Vaping status: Never Used  Substance and Sexual Activity   Alcohol  use: Not Currently   Drug use: Never   Sexual activity: Not Currently  Other Topics Concern   Not on file  Social History Narrative   Not on file   Social Drivers of Health   Financial Resource Strain: Not  on file  Food Insecurity: No Food Insecurity (08/29/2023)   Hunger Vital Sign    Worried About Running Out of Food in the Last Year: Never true    Ran Out of Food in the Last Year: Never true  Transportation Needs: No Transportation Needs (08/29/2023)   PRAPARE - Administrator, Civil Service (Medical): No    Lack of Transportation (Non-Medical): No  Physical Activity: Not on file  Stress: Not on file  Social Connections: Unknown (08/29/2023)   Social Connection and Isolation Panel    Frequency of Communication with Friends and Family: More than three times a week    Frequency of Social Gatherings with Friends and Family: Three times a week    Attends Religious Services: Never    Active Member of Clubs or Organizations: No    Attends Banker Meetings: Never    Marital Status: Patient declined  Intimate Partner Violence: Not At Risk (08/29/2023)   Humiliation, Afraid, Rape, and Kick questionnaire    Fear of Current or Ex-Partner: No    Emotionally Abused: No    Physically Abused: No    Sexually Abused: No      Family History  Problem Relation Age of Onset   Hypertension Mother    Diabetes Mother    Diabetes Father    Hypertension Father    High Cholesterol Father    Congestive Heart Failure Father    Breast cancer Cousin        PHYSICAL EXAM: General:  Well appearing. No respiratory difficulty HEENT: normal Neck: supple. no JVD. Carotids 2+  bilat; no bruits. No lymphadenopathy or thyromegaly appreciated. Cor: PMI nondisplaced. Regular rate & rhythm. No rubs, gallops or murmurs. Lungs: clear Abdomen: soft, nontender, nondistended. No hepatosplenomegaly. No bruits or masses. Good bowel sounds. Extremities: no cyanosis, clubbing, rash, edema Neuro: alert & oriented x 3, cranial nerves grossly intact. moves all 4 extremities w/o difficulty. Affect pleasant.  ECG:   ASSESSMENT & PLAN:  1: NICM with preserved ejection fraction- - suspect due to HTN - NYHA Courtney II - euvolemic today - weighing daily; reminded to call for an overnight weight gain of > 2 pounds or a weekly weight gain of > 5 pounds - weight up 16 pounds from last visit here 2 months ago but patient says her weight at home stays around 190 pounds; question accuracy of last visit's weight - Echo 09/12/21: EF of 55-60% along with mild LVH/ LAE & mild MR.  - Echo 02/25/22: EF of 65-70%.  - Echo 11/21/22: EF 60-65% along with Grade I DD and normal PA pressure of 17.6 mmHg. - not adding salt except on rare occasions;  - saw cardiology Courtney Courtney) 03/23; most recently during recent admission 05/24; put on her AVS for them to call and get f/u scheduled - continue furosemide  20mg  daily with additional 20mg  if needed - recurrent UTI's so not a candidate for SGLT2 - reviewed the importance of keeping her total daily fluid intake to 60-64oz and that, currently, she's drinking >100 oz daily - BNP 11/20/22 was 41.4   2: HTN- - BP 96/48 - follows with PCP at [facility] & returns 07/24 - BMP 12/24/22 reviewed and showed sodium 140, potassium 3.4, creatinine 0.97 and GFR >60   3: DM- - A1c 11/16/21 was 5.7%   4: Anemia- - hemoglobin 12/24/22 was 8.9 - saw hematology Courtney Courtney) 06/24 - iron  infusion done 10/08/22  5: Left lower extremity DVT- - has  IVC in place - unable to tolerate anticoagulation due to high bleeding risk   6: Recurrent UTI- - saw urology Courtney Courtney)  10/23 - underwent nephrogram and nephrostomy tube exchange in the past - not a candidate for SGLT2  Return in 3 months, sooner if needed.   Courtney DELENA Class, FNP 01/07/24

## 2024-01-08 ENCOUNTER — Telehealth: Payer: Self-pay | Admitting: Family

## 2024-01-08 ENCOUNTER — Encounter: Admitting: Family

## 2024-01-08 NOTE — Telephone Encounter (Signed)
 Patient did not show for her Heart Failure Clinic appointment on 01/08/24. This is the 7th appointment that she has missed.

## 2024-01-16 ENCOUNTER — Other Ambulatory Visit: Payer: Self-pay

## 2024-01-16 ENCOUNTER — Telehealth: Payer: Self-pay

## 2024-01-16 NOTE — Telephone Encounter (Signed)
 Patient called requesting that we take her kidney out and complaining that her nephrostomy tube was causing her to have diarrhea. I explained that the nephrostomy tube was unlikely to be related to her diarrhea. She stated that she wanted it out. I advised her that she would need to speak with her urologist regarding whether or not the tube could be removed. I further told her that IR would be happy to see her in clinic if needed after consultation with urology.

## 2024-01-22 ENCOUNTER — Ambulatory Visit: Admitting: Urology

## 2024-02-09 ENCOUNTER — Telehealth: Payer: Self-pay | Admitting: Urology

## 2024-02-09 NOTE — Telephone Encounter (Signed)
 Patient called to ask about referral to kidney specialist. She said Dr. Twylla was supposed to refer her. Her last appointment was 07/07/23. Please advise patient.

## 2024-02-10 NOTE — Telephone Encounter (Signed)
 Spoke with patient and advised she would need an appointment to discuss needs since she no showed other appointments.  Patient agreed and appointment scheduled.

## 2024-03-01 NOTE — Progress Notes (Signed)
 Patient for IR LT nephrostomy tube exchange on Tues 03/02/24, I called and spoke with the patient on the phone and gave pre-procedure instructions. Pt was made aware to be here at 12:30p and check in at the new entrance. Pt stated understanding. Called 03/01/24

## 2024-03-02 ENCOUNTER — Ambulatory Visit
Admission: RE | Admit: 2024-03-02 | Discharge: 2024-03-02 | Disposition: A | Discharge status: Home / Self Care | Source: Ambulatory Visit | Attending: Interventional Radiology | Admitting: Interventional Radiology

## 2024-03-04 NOTE — Progress Notes (Signed)
 Patient for IR LT Neph tube exchange on Friday 03/05/24, I called and spoke with the patient on the phone and gave pre-procedure instructions. Pt was made aware to be here at 1p and check in at the new entrance. Pt stated understanding. Called 03/02/24

## 2024-03-05 ENCOUNTER — Other Ambulatory Visit: Payer: Self-pay | Admitting: Interventional Radiology

## 2024-03-05 ENCOUNTER — Ambulatory Visit
Admission: RE | Admit: 2024-03-05 | Discharge: 2024-03-05 | Disposition: A | Discharge status: Home / Self Care | Source: Ambulatory Visit | Attending: Interventional Radiology | Admitting: Interventional Radiology

## 2024-03-05 DIAGNOSIS — N1330 Unspecified hydronephrosis: Secondary | ICD-10-CM | POA: Diagnosis not present

## 2024-03-05 DIAGNOSIS — Z436 Encounter for attention to other artificial openings of urinary tract: Secondary | ICD-10-CM | POA: Insufficient documentation

## 2024-03-05 HISTORY — PX: IR EXT NEPHROURETERAL CATH EXCHANGE: IMG5418

## 2024-03-05 MED ORDER — LIDOCAINE HCL 1 % IJ SOLN
10.0000 mL | Freq: Once | INTRAMUSCULAR | Status: AC
  Administered 2024-03-05: 10 mL via INTRADERMAL

## 2024-03-05 MED ORDER — IOHEXOL 300 MG/ML  SOLN
25.0000 mL | Freq: Once | INTRAMUSCULAR | Status: AC | PRN
  Administered 2024-03-05: 25 mL

## 2024-03-05 MED ORDER — LIDOCAINE HCL 1 % IJ SOLN
INTRAMUSCULAR | Status: AC
  Filled 2024-03-05: qty 20

## 2024-03-16 ENCOUNTER — Ambulatory Visit: Admitting: Urology

## 2024-04-06 ENCOUNTER — Other Ambulatory Visit: Payer: Self-pay

## 2024-04-06 ENCOUNTER — Inpatient Hospital Stay: Admission: EM | Admit: 2024-04-06 | Discharge: 2024-04-14 | DRG: 699 | Disposition: A

## 2024-04-06 ENCOUNTER — Emergency Department

## 2024-04-06 DIAGNOSIS — Z79899 Other long term (current) drug therapy: Secondary | ICD-10-CM

## 2024-04-06 DIAGNOSIS — I5032 Chronic diastolic (congestive) heart failure: Secondary | ICD-10-CM | POA: Diagnosis present

## 2024-04-06 DIAGNOSIS — N179 Acute kidney failure, unspecified: Secondary | ICD-10-CM | POA: Diagnosis present

## 2024-04-06 DIAGNOSIS — R1032 Left lower quadrant pain: Secondary | ICD-10-CM

## 2024-04-06 DIAGNOSIS — R10A0 Flank pain, unspecified side: Secondary | ICD-10-CM | POA: Diagnosis present

## 2024-04-06 DIAGNOSIS — Z833 Family history of diabetes mellitus: Secondary | ICD-10-CM | POA: Diagnosis not present

## 2024-04-06 DIAGNOSIS — M545 Low back pain, unspecified: Secondary | ICD-10-CM

## 2024-04-06 DIAGNOSIS — I959 Hypotension, unspecified: Secondary | ICD-10-CM | POA: Diagnosis present

## 2024-04-06 DIAGNOSIS — J449 Chronic obstructive pulmonary disease, unspecified: Secondary | ICD-10-CM | POA: Diagnosis present

## 2024-04-06 DIAGNOSIS — B9620 Unspecified Escherichia coli [E. coli] as the cause of diseases classified elsewhere: Secondary | ICD-10-CM | POA: Diagnosis present

## 2024-04-06 DIAGNOSIS — Z86718 Personal history of other venous thrombosis and embolism: Secondary | ICD-10-CM | POA: Diagnosis not present

## 2024-04-06 DIAGNOSIS — E785 Hyperlipidemia, unspecified: Secondary | ICD-10-CM | POA: Diagnosis present

## 2024-04-06 DIAGNOSIS — Z8542 Personal history of malignant neoplasm of other parts of uterus: Secondary | ICD-10-CM | POA: Diagnosis not present

## 2024-04-06 DIAGNOSIS — Z936 Other artificial openings of urinary tract status: Secondary | ICD-10-CM | POA: Diagnosis not present

## 2024-04-06 DIAGNOSIS — I110 Hypertensive heart disease with heart failure: Secondary | ICD-10-CM | POA: Diagnosis present

## 2024-04-06 DIAGNOSIS — E7800 Pure hypercholesterolemia, unspecified: Secondary | ICD-10-CM | POA: Diagnosis present

## 2024-04-06 DIAGNOSIS — Z8249 Family history of ischemic heart disease and other diseases of the circulatory system: Secondary | ICD-10-CM | POA: Diagnosis not present

## 2024-04-06 DIAGNOSIS — N390 Urinary tract infection, site not specified: Principal | ICD-10-CM | POA: Diagnosis present

## 2024-04-06 DIAGNOSIS — Y833 Surgical operation with formation of external stoma as the cause of abnormal reaction of the patient, or of later complication, without mention of misadventure at the time of the procedure: Secondary | ICD-10-CM | POA: Diagnosis present

## 2024-04-06 DIAGNOSIS — B9629 Other Escherichia coli [E. coli] as the cause of diseases classified elsewhere: Secondary | ICD-10-CM | POA: Diagnosis present

## 2024-04-06 DIAGNOSIS — T83512A Infection and inflammatory reaction due to nephrostomy catheter, initial encounter: Secondary | ICD-10-CM | POA: Diagnosis present

## 2024-04-06 DIAGNOSIS — Z6841 Body Mass Index (BMI) 40.0 and over, adult: Secondary | ICD-10-CM

## 2024-04-06 DIAGNOSIS — D509 Iron deficiency anemia, unspecified: Secondary | ICD-10-CM | POA: Diagnosis present

## 2024-04-06 DIAGNOSIS — Z923 Personal history of irradiation: Secondary | ICD-10-CM

## 2024-04-06 DIAGNOSIS — Z1612 Extended spectrum beta lactamase (ESBL) resistance: Secondary | ICD-10-CM | POA: Diagnosis present

## 2024-04-06 DIAGNOSIS — Z9889 Other specified postprocedural states: Secondary | ICD-10-CM

## 2024-04-06 DIAGNOSIS — Z8619 Personal history of other infectious and parasitic diseases: Secondary | ICD-10-CM

## 2024-04-06 DIAGNOSIS — Z803 Family history of malignant neoplasm of breast: Secondary | ICD-10-CM

## 2024-04-06 DIAGNOSIS — R531 Weakness: Secondary | ICD-10-CM | POA: Diagnosis present

## 2024-04-06 DIAGNOSIS — E119 Type 2 diabetes mellitus without complications: Secondary | ICD-10-CM | POA: Diagnosis present

## 2024-04-06 DIAGNOSIS — I10 Essential (primary) hypertension: Secondary | ICD-10-CM | POA: Diagnosis present

## 2024-04-06 DIAGNOSIS — Z83438 Family history of other disorder of lipoprotein metabolism and other lipidemia: Secondary | ICD-10-CM

## 2024-04-06 DIAGNOSIS — Z23 Encounter for immunization: Secondary | ICD-10-CM

## 2024-04-06 DIAGNOSIS — Z86711 Personal history of pulmonary embolism: Secondary | ICD-10-CM

## 2024-04-06 DIAGNOSIS — K219 Gastro-esophageal reflux disease without esophagitis: Secondary | ICD-10-CM | POA: Diagnosis present

## 2024-04-06 DIAGNOSIS — Z87440 Personal history of urinary (tract) infections: Secondary | ICD-10-CM

## 2024-04-06 DIAGNOSIS — E876 Hypokalemia: Secondary | ICD-10-CM | POA: Diagnosis present

## 2024-04-06 DIAGNOSIS — E66813 Obesity, class 3: Secondary | ICD-10-CM | POA: Diagnosis present

## 2024-04-06 LAB — CBC WITH DIFFERENTIAL/PLATELET
Abs Immature Granulocytes: 0.02 K/uL (ref 0.00–0.07)
Basophils Absolute: 0 K/uL (ref 0.0–0.1)
Basophils Relative: 1 %
Eosinophils Absolute: 0.1 K/uL (ref 0.0–0.5)
Eosinophils Relative: 3 %
HCT: 31.7 % — ABNORMAL LOW (ref 36.0–46.0)
Hemoglobin: 9.2 g/dL — ABNORMAL LOW (ref 12.0–15.0)
Immature Granulocytes: 0 %
Lymphocytes Relative: 20 %
Lymphs Abs: 1 K/uL (ref 0.7–4.0)
MCH: 25.3 pg — ABNORMAL LOW (ref 26.0–34.0)
MCHC: 29 g/dL — ABNORMAL LOW (ref 30.0–36.0)
MCV: 87.1 fL (ref 80.0–100.0)
Monocytes Absolute: 0.3 K/uL (ref 0.1–1.0)
Monocytes Relative: 6 %
Neutro Abs: 3.5 K/uL (ref 1.7–7.7)
Neutrophils Relative %: 70 %
Platelets: 307 K/uL (ref 150–400)
RBC: 3.64 MIL/uL — ABNORMAL LOW (ref 3.87–5.11)
RDW: 17.2 % — ABNORMAL HIGH (ref 11.5–15.5)
WBC: 5 K/uL (ref 4.0–10.5)
nRBC: 0 % (ref 0.0–0.2)

## 2024-04-06 LAB — COMPREHENSIVE METABOLIC PANEL WITH GFR
ALT: 9 U/L (ref 0–44)
AST: 18 U/L (ref 15–41)
Albumin: 3.3 g/dL — ABNORMAL LOW (ref 3.5–5.0)
Alkaline Phosphatase: 50 U/L (ref 38–126)
Anion gap: 10 (ref 5–15)
BUN: 18 mg/dL (ref 8–23)
CO2: 24 mmol/L (ref 22–32)
Calcium: 9.1 mg/dL (ref 8.9–10.3)
Chloride: 105 mmol/L (ref 98–111)
Creatinine, Ser: 1.25 mg/dL — ABNORMAL HIGH (ref 0.44–1.00)
GFR, Estimated: 47 mL/min — ABNORMAL LOW (ref >60)
Glucose, Bld: 109 mg/dL — ABNORMAL HIGH (ref 70–99)
Potassium: 4.6 mmol/L (ref 3.5–5.1)
Sodium: 139 mmol/L (ref 135–145)
Total Bilirubin: 0.9 mg/dL (ref 0.0–1.2)
Total Protein: 6.8 g/dL (ref 6.5–8.1)

## 2024-04-06 LAB — URINALYSIS, W/ REFLEX TO CULTURE (INFECTION SUSPECTED)
Bacteria, UA: NONE SEEN
Bilirubin Urine: NEGATIVE
Glucose, UA: NEGATIVE mg/dL
Ketones, ur: 5 mg/dL — AB
Nitrite: NEGATIVE
Protein, ur: 100 mg/dL — AB
RBC / HPF: >50 RBC/hpf (ref 0–5)
Specific Gravity, Urine: >1.046 — ABNORMAL HIGH (ref 1.005–1.030)
Squamous Epithelial / HPF: 0 /HPF (ref 0–5)
WBC, UA: >50 WBC/hpf (ref 0–5)
pH: 7 (ref 5.0–8.0)

## 2024-04-06 LAB — LACTIC ACID, PLASMA: Lactic Acid, Venous: 1.1 mmol/L (ref 0.5–1.9)

## 2024-04-06 MED ORDER — SODIUM CHLORIDE 0.9 % IV BOLUS
1000.0000 mL | Freq: Once | INTRAVENOUS | Status: AC
  Administered 2024-04-06: 1000 mL via INTRAVENOUS

## 2024-04-06 MED ORDER — HEPARIN SODIUM (PORCINE) 5000 UNIT/ML IJ SOLN: 5000.0000 [IU] | Freq: Three times a day (TID) | INTRAMUSCULAR | Status: DC

## 2024-04-06 MED ORDER — PANTOPRAZOLE SODIUM 40 MG PO TBEC
40.0000 mg | DELAYED_RELEASE_TABLET | Freq: Two times a day (BID) | ORAL | Status: DC
  Administered 2024-04-06 – 2024-04-14 (×16): 40 mg via ORAL
  Filled 2024-04-06 (×16): qty 1

## 2024-04-06 MED ORDER — ACETAMINOPHEN 650 MG RE SUPP: 650.0000 mg | Freq: Four times a day (QID) | RECTAL | Status: AC | PRN

## 2024-04-06 MED ORDER — SODIUM CHLORIDE 0.9 % IV SOLN: INTRAVENOUS | Status: DC

## 2024-04-06 MED ORDER — SODIUM CHLORIDE 0.9 % IV SOLN
1.0000 g | Freq: Two times a day (BID) | INTRAVENOUS | Status: DC
  Administered 2024-04-06 – 2024-04-07 (×3): 1 g via INTRAVENOUS
  Filled 2024-04-06 (×3): qty 20

## 2024-04-06 MED ORDER — ENOXAPARIN SODIUM 30 MG/0.3ML IJ SOSY
30.0000 mg | PREFILLED_SYRINGE | Freq: Two times a day (BID) | INTRAMUSCULAR | Status: DC
  Administered 2024-04-06 – 2024-04-14 (×16): 30 mg via SUBCUTANEOUS
  Filled 2024-04-06 (×16): qty 0.3

## 2024-04-06 MED ORDER — FERROUS SULFATE 325 (65 FE) MG PO TABS
325.0000 mg | ORAL_TABLET | Freq: Every day | ORAL | Status: DC
  Administered 2024-04-07 – 2024-04-14 (×8): 325 mg via ORAL
  Filled 2024-04-06 (×8): qty 1

## 2024-04-06 MED ORDER — MIDODRINE HCL 5 MG PO TABS
10.0000 mg | ORAL_TABLET | Freq: Three times a day (TID) | ORAL | Status: AC
  Administered 2024-04-06: 10 mg via ORAL
  Filled 2024-04-06: qty 2

## 2024-04-06 MED ORDER — ACETAMINOPHEN 325 MG PO TABS
650.0000 mg | ORAL_TABLET | Freq: Four times a day (QID) | ORAL | Status: AC | PRN
  Administered 2024-04-06 – 2024-04-11 (×6): 650 mg via ORAL
  Filled 2024-04-06 (×6): qty 2

## 2024-04-06 MED ORDER — MORPHINE SULFATE (PF) 2 MG/ML IV SOLN
2.0000 mg | Freq: Once | INTRAVENOUS | Status: AC
  Administered 2024-04-06: 2 mg via INTRAVENOUS
  Filled 2024-04-06: qty 1

## 2024-04-06 MED ORDER — IOHEXOL 300 MG/ML  SOLN
100.0000 mL | Freq: Once | INTRAMUSCULAR | Status: AC | PRN
  Administered 2024-04-06: 100 mL via INTRAVENOUS

## 2024-04-06 MED ORDER — FLUTICASONE FUROATE-VILANTEROL 100-25 MCG/ACT IN AEPB
1.0000 | INHALATION_SPRAY | Freq: Every day | RESPIRATORY_TRACT | Status: DC
  Administered 2024-04-07 – 2024-04-14 (×8): 1 via RESPIRATORY_TRACT
  Filled 2024-04-06: qty 28

## 2024-04-06 MED ORDER — VITAMIN B-12 100 MCG PO TABS
100.0000 ug | ORAL_TABLET | Freq: Every day | ORAL | Status: DC
  Administered 2024-04-07 – 2024-04-14 (×8): 100 ug via ORAL
  Filled 2024-04-06 (×8): qty 1

## 2024-04-06 MED ORDER — ONDANSETRON HCL 4 MG PO TABS: 4.0000 mg | ORAL_TABLET | Freq: Four times a day (QID) | ORAL | Status: AC | PRN

## 2024-04-06 MED ORDER — VANCOMYCIN HCL 2000 MG/400ML IV SOLN
2000.0000 mg | Freq: Once | INTRAVENOUS | Status: AC
Start: 2024-04-06 — End: 2024-04-06
  Administered 2024-04-06: 2000 mg via INTRAVENOUS
  Filled 2024-04-06: qty 400

## 2024-04-06 MED ORDER — VANCOMYCIN VARIABLE DOSE PER UNSTABLE RENAL FUNCTION (PHARMACIST DOSING): Status: DC

## 2024-04-06 MED ORDER — ROSUVASTATIN CALCIUM 10 MG PO TABS
10.0000 mg | ORAL_TABLET | Freq: Every day | ORAL | Status: DC
  Administered 2024-04-06 – 2024-04-13 (×8): 10 mg via ORAL
  Filled 2024-04-06 (×9): qty 1

## 2024-04-06 MED ORDER — VITAMIN C 500 MG PO TABS
1000.0000 mg | ORAL_TABLET | Freq: Every day | ORAL | Status: DC
  Administered 2024-04-07 – 2024-04-14 (×8): 1000 mg via ORAL
  Filled 2024-04-06 (×8): qty 2

## 2024-04-06 MED ORDER — ONDANSETRON HCL 4 MG/2ML IJ SOLN: 4.0000 mg | Freq: Four times a day (QID) | INTRAMUSCULAR | Status: AC | PRN

## 2024-04-06 MED ORDER — ALBUTEROL SULFATE (2.5 MG/3ML) 0.083% IN NEBU: 3.0000 mL | INHALATION_SOLUTION | Freq: Four times a day (QID) | RESPIRATORY_TRACT | Status: DC | PRN

## 2024-04-06 MED ORDER — IOHEXOL 300 MG/ML  SOLN: 80.0000 mL | Freq: Once | INTRAMUSCULAR | Status: DC | PRN

## 2024-04-06 NOTE — Assessment & Plan Note (Signed)
 Not in acute exacerbation Home Breo Ellipta  1 puff inhalation daily resumed Albuterol  inhalation every 6 hours.  For wheezing and shortness of breath resume

## 2024-04-06 NOTE — Assessment & Plan Note (Signed)
 -  This complicates overall care and prognosis.

## 2024-04-06 NOTE — Assessment & Plan Note (Addendum)
 Status post sodium chloride  1 L bolus per EDP I have ordered sodium chloride  1 L bolus on admission Sodium chloride  infusion at 150 mL/h, 1 day ordered Midodrine  3 times daily with meals, 1 dose ordered on admission Goal MAP > 65

## 2024-04-06 NOTE — Assessment & Plan Note (Signed)
 Fall precaution.

## 2024-04-06 NOTE — Progress Notes (Signed)
 Pharmacy Antibiotic Note  Courtney Mack is a 68 y.o. female admitted on 04/06/2024 with UTI and suspected infection of nephrostomy tube site. Patient presenting after 3 days of dysuria and left flank pain. Patient also reports increasing tenderness and drainage around L nephrostomy tube site. PMH significant for diabetes, GERD, hypertension, hyperlipidemia, chronic radiation cystitis, history of PE/DVT on Lovenox . Of note, patient was admitted in Feb 2025 for ESBL Ecoli UTI. Pharmacy has been consulted for vancomycin  and meropenem  dosing.  Scr 1.25 (BL ~0.7-0.9); afebrile, WBC 5.0  Plan: Give vancomycin  load of 2000 mg IV x1 F/u Scr in AM - consider checking vancomycin  random level vs scheduling further doses tomorrow if renal function has improved Start meropenem  1g IV every 12 hours Monitor renal function, clinical status, culture data, and antibiotic LOT  Height: 4' 11 (149.9 cm) Weight: 90.7 kg (200 lb) IBW/kg (Calculated) : 43.2  Temp (24hrs), Avg:99.6 F (37.6 C), Min:99.6 F (37.6 C), Max:99.6 F (37.6 C)  Recent Labs  Lab 04/06/24 0933  WBC 5.0  CREATININE 1.25*  LATICACIDVEN 1.1    Estimated Creatinine Clearance: 41.7 mL/min (A) (by C-G formula based on SCr of 1.25 mg/dL (H)).    No Known Allergies  Antimicrobials this admission: vancomycin  9/30 >>  meropenem  9/30 >>   Dose adjustments this admission: N/A  Microbiology results:  9/30 Ucx: pending  Thank you for involving pharmacy in this patient's care.   Damien Napoleon, PharmD Clinical Pharmacist 04/06/2024 11:48 AM

## 2024-04-06 NOTE — ED Provider Notes (Signed)
 Courtney Mack Provider Note    Event Date/Time   First MD Initiated Contact with Patient 04/06/24 [phone]     (approximate)   History   left sided pain   HPI  Courtney Mack is a 68 y.o. female with history of diabetes, GERD, hypertension, hyperlipidemia, chronic radiation cystitis, history of PE/DVT on Lovenox , here for 3 days of dysuria, left lower quadrant, left flank, left back pain.  She denies any fever but states that she has been feeling intermittently hot, no nausea vomiting or diarrhea, no chest pain or shortness of breath.  States that she has tenderness as well around her nephrostomy tube site.  There is some drainage.  On independent chart review, patient had a nephrostomy tube exchange done by IR on 29 August.  On independent chart review, she was also admitted in February for complicated UTI, in the setting of a left nephrostomy tube.  She does have history of pseudomonal UTIs.  She required switch to meropenem  based on susceptibilities and ESBL infection.     Physical Exam   Triage Vital Signs: ED Triage Vitals  Encounter Vitals Group     BP      Girls Systolic BP Percentile      Girls Diastolic BP Percentile      Boys Systolic BP Percentile      Boys Diastolic BP Percentile      Pulse      Resp      Temp      Temp src      SpO2      Weight      Height      Head Circumference      Peak Flow      Pain Score      Pain Loc      Pain Education      Exclude from Growth Chart     Most recent vital signs: Vitals:   04/06/24 0909 04/06/24 0918  BP:  (!) 102/46  Pulse: 82   Resp: 17   Temp: 99.6 F (37.6 C)   SpO2: 100%      General: Awake, no distress.  CV:  Good peripheral perfusion.  Resp:  Normal effort.  Abd:  No distention.  Soft, tender to the left lower quadrant Other:  She does have a nephrostomy tube in place, there is no attached bag, there is some surrounding erythema and tenderness, there is purulent drainage  from the site, she does have CVA tenderness on the left   ED Results / Procedures / Treatments   Labs (all labs ordered are listed, but only abnormal results are displayed) Labs Reviewed  COMPREHENSIVE METABOLIC PANEL WITH GFR - Abnormal; Notable for the following components:      Result Value   Glucose, Bld 109 (*)    Creatinine, Ser 1.25 (*)    Albumin 3.3 (*)    GFR, Estimated 47 (*)    All other components within normal limits  CBC WITH DIFFERENTIAL/PLATELET - Abnormal; Notable for the following components:   RBC 3.64 (*)    Hemoglobin 9.2 (*)    HCT 31.7 (*)    MCH 25.3 (*)    MCHC 29.0 (*)    RDW 17.2 (*)    All other components within normal limits  URINALYSIS, W/ REFLEX TO CULTURE (INFECTION SUSPECTED) - Abnormal; Notable for the following components:   Color, Urine YELLOW (*)    APPearance CLOUDY (*)    Specific  Gravity, Urine >1.046 (*)    Hgb urine dipstick SMALL (*)    Ketones, ur 5 (*)    Protein, ur 100 (*)    Leukocytes,Ua LARGE (*)    All other components within normal limits  URINE CULTURE  LACTIC ACID, PLASMA  HIV ANTIBODY (ROUTINE TESTING W REFLEX)     RADIOLOGY On my independent interpretation, CT without obvious hydronephrosis   PROCEDURES:  Critical Care performed: No  Procedures   MEDICATIONS ORDERED IN ED: Medications  vancomycin  (VANCOREADY) IVPB 2000 mg/400 mL (2,000 mg Intravenous New Bag/Given 04/06/24 1253)  meropenem  (MERREM ) 1 g in sodium chloride  0.9 % 100 mL IVPB (0 g Intravenous Stopped 04/06/24 1244)  vancomycin  variable dose per unstable renal function (pharmacist dosing) (has no administration in time range)  acetaminophen  (TYLENOL ) tablet 650 mg (has no administration in time range)    Or  acetaminophen  (TYLENOL ) suppository 650 mg (has no administration in time range)  ondansetron  (ZOFRAN ) tablet 4 mg (has no administration in time range)    Or  ondansetron  (ZOFRAN ) injection 4 mg (has no administration in time range)   heparin  injection 5,000 Units (has no administration in time range)  sodium chloride  0.9 % bolus 1,000 mL (0 mLs Intravenous Stopped 04/06/24 1201)  morphine  (PF) 2 MG/ML injection 2 mg (2 mg Intravenous Given 04/06/24 0943)  iohexol  (OMNIPAQUE ) 300 MG/ML solution 100 mL (100 mLs Intravenous Contrast Given 04/06/24 1033)     IMPRESSION / MDM / ASSESSMENT AND PLAN / ED COURSE  I reviewed the triage vital signs and the nursing notes.                              Differential diagnosis includes, but is not limited to, complicated UTI, nondraining nephrostomy tube, overlying site infection, cellulitis, pyelonephritis, did also consider nephrolithiasis, colitis, diverticulitis.  Will get labs, UA, CT abdomen pelvis, will give her some IV morphine  and IV fluids here.  Patient's presentation is most consistent with acute presentation with potential threat to life or bodily function.  Independent interpretation of labs and imaging below.  Clinical course as below.  Given nephrostomy site infection as well as history of ESBL UTI with positive UA, she will need to be admitted for IV antibiotics and further management.  Consult the hospitalist who is agreeable with the plan for admission and will evaluate the patient.  She is admitted.    Clinical Course as of 04/06/24 1317  Tue Apr 06, 2024  1030 Independent review of labs, lactate not elevated, electrolytes not severely deranged, AKI noted, no leukocytosis. [TT]  1119 CT ABDOMEN PELVIS W CONTRAST IMPRESSION: 1. Left percutaneous nephrostomy and double-J ureteral stent in place with satisfactory positioning. Left renal collecting system, renal hilum, left ureter, and bladder appear acutely inflamed. But no associated abscess, fluid collection, and no overt left pyelonephritis. Adjacent small but reactive appearing left retroperitoneal lymph nodes.  2. Right renal 6 mm calculus now located centrally in the right renal pelvis. But no findings  of acute obstructive uropathy.  3. Otherwise stable since January CT appearance of the Abdomen and Pelvis. Aortic Atherosclerosis (ICD10-I70.0).   [TT]  1143 Given redness, purulent drainage around the nephrostomy site and dysuria with history of ESBL UTI, will start her on Vanco and meropenem .  She will need to be admitted for further management. [TT]  1243 Urinalysis, w/ Reflex to Culture (Infection Suspected) -Urine, Clean Catch(!) Consistent with UTI. [TT]  Clinical Course User Index [TT] Waymond Lorelle Cummins, MD     FINAL CLINICAL IMPRESSION(S) / ED DIAGNOSES   Final diagnoses:  Urinary tract infection without hematuria, site unspecified  Left lower quadrant abdominal pain  Flank pain  Acute left-sided low back pain without sciatica  H/O insertion of nephrostomy tube  Infection associated with nephrostomy catheter, initial encounter     Rx / DC Orders   ED Discharge Orders     None        Note:  This document was prepared using Dragon voice recognition software and may include unintentional dictation errors.    Waymond Lorelle Cummins, MD 04/06/24 [id number]

## 2024-04-06 NOTE — ED Triage Notes (Signed)
 Pt arrived via [facility] from home with left sided pain that started 2-3 days ago. Per EMS, pt reports burning with urination. Pt is A&Ox4.

## 2024-04-06 NOTE — Assessment & Plan Note (Signed)
 Baseline serum creatinine is 0.78-0.97, no CKD Query prerenal secondary to poor p.o. intake, diarrhea Status post sodium chloride  1 L bolus per EDP Continue with sodium chloride  infusion at 100 mL/h, 1 day ordered Recheck BMP in a.m.

## 2024-04-06 NOTE — Assessment & Plan Note (Addendum)
 Continue ertapenem and vancomycin  per pharmacy Urine culture in process A.m. team to consider consultation to urology for nephrostomy tube exchange

## 2024-04-06 NOTE — Assessment & Plan Note (Signed)
Home rosuvastatin 10 mg nightly resumed

## 2024-04-06 NOTE — Hospital Course (Signed)
 Ms. Christabel Camire is a 68 year old female with history of DVT/PE on Lovenox , hypertension, GERD, left nephrostomy tube status, hyperlipidemia, who presents to the ED for chief concerns of dysuria for 2 days and subjective fever.  Vitals in the ED showed t 99.6, rr 17, hr 82, blood pressure 102/46, SpO2 100% on room air.  Serum sodium is 139, potassium 4.6, chloride 105, bicarb 24, BUN of 18, serum creatinine 1.25, eGFR 47, nonfasting blood glucose 109, WBC 5.0, hemoglobin 9.2, platelets of 307.  Lactic acid is 1.1.  ED treatment: Morphine  2 mg IV one-time dose, vancomycin  per pharmacy, meropenem  per pharmacy, sodium chloride  1 L bolus.

## 2024-04-06 NOTE — Assessment & Plan Note (Signed)
 Resume home iron  supplementation with breakfast

## 2024-04-06 NOTE — H&P (Signed)
 History and Physical   Courtney Mack FMW:[id number] DOB: 05-20-1956 DOA: 04/06/2024  PCP: [facility]  Patient coming from: Home via EMS  I have personally briefly reviewed patient's old medical records in [facility] EMR.  Chief Concern: Left-sided pain, dysuria  HPI: 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  Vitals in the ED showed t 99.6, rr 17, hr 82, blood pressure 102/46, SpO2 100% on room air.  Serum sodium is 139, potassium 4.6, chloride 105, bicarb 24, BUN of 18, serum creatinine 1.25, eGFR 47, nonfasting blood glucose 109, WBC 5.0, hemoglobin 9.2, platelets of 307.  Lactic acid is 1.1.  ED treatment: Morphine  2 mg IV one-time dose, vancomycin  per pharmacy, meropenem  per pharmacy, sodium chloride  1 L bolus. ----------------------------------------------------- At bedside, patient was able to tell me her first last name, her age, her current location.  She was not able to tell me the current calendar year.  She initially states is 2024, then 2027, then 2004.  She reports that she has been having pain with urination for about 2 days.  She endorses subjective fever however did not check her temperature at home.  She endorses chills chronically.  She denies nausea, vomiting, hematuria, blood in her stool, chest pain, shortness of breath, new swelling of her lower extremities.  She endorses diarrhea and reports that the stool has been loose and black as she is on iron  supplementations.  She reports she has 1 loose bowel movement per day and sometimes it is watery.  Social history: She lives at home with her daughters.  She denies tobacco, EtOH, recreational drug use.  She is retired.  ROS: Constitutional: no weight change, no fever ENT/Mouth: no sore throat, no  rhinorrhea Eyes: no eye pain, no vision changes Cardiovascular: no chest pain, no dyspnea,  no edema, no palpitations Respiratory: no cough, no sputum, no wheezing Gastrointestinal: no nausea, no vomiting, no diarrhea, no constipation Genitourinary: no urinary incontinence, + dysuria, no hematuria Musculoskeletal: no arthralgias, no myalgias Skin: no skin lesions, no pruritus, Neuro: + weakness, no loss of consciousness, no syncope Psych: no anxiety, no depression, + decrease appetite Heme/Lymph: no bruising, no bleeding  ED Course: Discussed with EDP, patient requiring hospitalization for chief concerns of complicated UTI.  Assessment/Plan  Principal Problem:   UTI due to extended-spectrum beta lactamase (ESBL) producing Escherichia coli Active Problems:   AKI (acute kidney injury)   Hypotension   Nephrostomy status (HCC)   Hypertension   Obesity, Class III, BMI 40-49.9 (morbid obesity)   Dyslipidemia   IDA (iron  deficiency anemia)   Iron  deficiency anemia   Generalized weakness   Chronic obstructive pulmonary disease (COPD) (HCC)   Assessment and Plan:  * UTI due to extended-spectrum beta lactamase (ESBL) producing Escherichia coli Continue ertapenem and vancomycin  per pharmacy Urine culture in process A.m. team to consider consultation to urology for nephrostomy tube exchange  Hypotension Status post sodium chloride  1 L bolus per EDP I have ordered sodium chloride  1 L bolus on admission Sodium chloride  infusion at 150 mL/h, 1 day ordered Midodrine  3 times daily with meals, 1 dose ordered on admission Goal MAP > 65  AKI (acute kidney injury) Baseline serum creatinine is 0.78-0.97, no CKD Query prerenal secondary to poor p.o. intake, diarrhea Status post sodium chloride  1 L bolus per EDP  Continue with sodium chloride  infusion at 100 mL/h, 1 day ordered Recheck BMP in a.m.  Iron  deficiency anemia Resume home iron  supplementation with breakfast    Dyslipidemia Home rosuvastatin  10 mg nightly resumed  Obesity, Class III, BMI 40-49.9 (morbid obesity) This complicates overall care and prognosis.   Chronic obstructive pulmonary disease (COPD) (HCC) Not in acute exacerbation Home Breo Ellipta  1 puff inhalation daily resumed Albuterol  inhalation every 6 hours.  For wheezing and shortness of breath resume  Generalized weakness Fall precaution  Chart reviewed.   DVT prophylaxis: Enoxaparin  Code Status: Full code Diet: Heart healthy Family Communication: No, patient states her daughter is no Disposition Plan: Pending clinical course Consults called: Pharmacy Admission status: Telemetry cardiac, inpatient  Past Medical History:  Diagnosis Date   Acute bilateral deep vein thrombosis (DVT) of femoral veins (HCC) 01/02/2021   Acute massive pulmonary embolism (HCC) 08/25/2020   Last Assessment & Plan:  Formatting of this note might be different from the original. Post vena caval filter and on xarelto  and O2   Anemia    ARF (acute respiratory failure) (HCC)    Cellulitis of left lower leg 06/20/2021   CHF (congestive heart failure) (HCC)    COVID-19    Diabetes mellitus without complication (HCC)    DVT (deep venous thrombosis) (HCC) 09/06/2020   Endometrial cancer (HCC)    GAVE (gastric antral vascular ectasia)    GERD (gastroesophageal reflux disease)    High cholesterol    Hx of blood clots    Hyperlipidemia    Hypertension    IDA (iron  deficiency anemia) 09/21/2020   Obesity    Pneumonia 06/11/2022   recovered   Pressure injury of skin 06/21/2021   Pulmonary embolism (HCC)    Sepsis (HCC)    Thrombocytopenia    Urinary tract infection 09/13/2021   Past Surgical History:  Procedure Laterality Date   CATARACT EXTRACTION W/PHACO Right 06/04/2022   Procedure: CATARACT EXTRACTION PHACO AND INTRAOCULAR LENS PLACEMENT (IOC) COMPLICATED RIGHT  31.83  02:17.4;  Surgeon: Jaye Fallow, MD;  Location: [facility];  Service: Ophthalmology;  Laterality: Right;   CATARACT EXTRACTION W/PHACO Left 07/09/2022   Procedure: CATARACT EXTRACTION PHACO AND INTRAOCULAR LENS PLACEMENT (IOC);  Surgeon: Jaye Fallow, MD;  Location: [facility];  Service: Ophthalmology;  Laterality: Left;  22.75 1:40.3   COLONOSCOPY N/A 06/21/2021   Procedure: COLONOSCOPY;  Surgeon: Toledo, Ladell POUR, MD;  Location: [facility] END[facility];  Service: Gastroenterology;  Laterality: N/A;   CYSTOSCOPY W/ URETERAL STENT PLACEMENT Left 09/11/2021   Procedure: CYSTOSCOPY WITH RETROGRADE PYELOGRAM/URETERAL STENT PLACEMENT;  Surgeon: Twylla Glendia BROCKS, MD;  Location: [facility] ORS;  Service: Urology;  Laterality: Left;   CYSTOSCOPY W/ URETERAL STENT PLACEMENT Left 11/17/2021   Procedure: CYSTOSCOPY WITH STENT REPLACEMENT;  Surgeon: Watt Rush, MD;  Location: [facility] ORS;  Service: Urology;  Laterality: Left;   CYSTOSCOPY WITH FULGERATION  11/17/2021   Procedure: CYSTOSCOPY WITH FULGERATION;  Surgeon: Watt Rush, MD;  Location: [facility] ORS;  Service: Urology;;   CYSTOSCOPY WITH STENT PLACEMENT Left 10/17/2021   Procedure: CYSTOSCOPY WITH STENT PLACEMENT;  Surgeon: Twylla Glendia BROCKS, MD;  Location: [facility] ORS;  Service: Urology;  Laterality: Left;   CYSTOSCOPY WITH URETEROSCOPY, STONE BASKETRY AND STENT PLACEMENT Left 10/16/2021   Procedure: CYSTOSCOPY WITH URETEROSCOPY  AND STENT REMOVAL;  Surgeon: Twylla Glendia BROCKS, MD;  Location: [facility] ORS;  Service: Urology;  Laterality: Left;   ENTEROSCOPY N/A 08/17/2021   Procedure: ENTEROSCOPY;  Surgeon: Unk Corinn Skiff, MD;  Location: [facility];  Service: Gastroenterology;  Laterality: N/A;   ESOPHAGOGASTRODUODENOSCOPY N/A 06/21/2021   Procedure: ESOPHAGOGASTRODUODENOSCOPY (EGD);  Surgeon: Toledo, Ladell POUR, MD;  Location: [facility];  Service: Gastroenterology;  Laterality: N/A;   ESOPHAGOGASTRODUODENOSCOPY (EGD) WITH PROPOFOL  N/A 08/17/2021   Procedure: ESOPHAGOGASTRODUODENOSCOPY (EGD) WITH PROPOFOL ;   Surgeon: Unk Corinn Skiff, MD;  Location: [facility];  Service: Gastroenterology;  Laterality: N/A;   ESOPHAGOGASTRODUODENOSCOPY (EGD) WITH PROPOFOL  N/A 10/14/2021   Procedure: ESOPHAGOGASTRODUODENOSCOPY (EGD) WITH PROPOFOL ;  Surgeon: Jinny Carmine, MD;  Location: [facility];  Service: Endoscopy;  Laterality: N/A;   ESOPHAGOGASTRODUODENOSCOPY (EGD) WITH PROPOFOL  N/A 02/01/2022   Procedure: ESOPHAGOGASTRODUODENOSCOPY (EGD) WITH PROPOFOL ;  Surgeon: Unk Corinn Skiff, MD;  Location: [facility];  Service: Gastroenterology;  Laterality: N/A;   ESOPHAGOGASTRODUODENOSCOPY (EGD) WITH PROPOFOL  N/A 03/15/2022   Procedure: ESOPHAGOGASTRODUODENOSCOPY (EGD) WITH PROPOFOL ;  Surgeon: Therisa Bi, MD;  Location: [facility] END[facility];  Service: Gastroenterology;  Laterality: N/A;   ESOPHAGOGASTRODUODENOSCOPY (EGD) WITH PROPOFOL  N/A 04/18/2022   Procedure: ESOPHAGOGASTRODUODENOSCOPY (EGD) WITH PROPOFOL ;  Surgeon: Unk Corinn Skiff, MD;  Location: [facility];  Service: Gastroenterology;  Laterality: N/A;   ESOPHAGOGASTRODUODENOSCOPY (EGD) WITH PROPOFOL  N/A 08/09/2022   Procedure: ESOPHAGOGASTRODUODENOSCOPY (EGD) WITH PROPOFOL ;  Surgeon: Therisa Bi, MD;  Location: [facility] ENDOSCOPY;  Service: Gastroenterology;  Laterality: N/A;   GIVENS CAPSULE STUDY N/A 06/22/2021   Procedure: GIVENS CAPSULE STUDY;  Surgeon: Toledo, Ladell POUR, MD;  Location: [facility];  Service: Gastroenterology;  Laterality: N/A;   IR CONVERT LEFT NEPHROSTOMY TO NEPHROURETERAL CATH  08/29/2023   IR EXT NEPHROURETERAL CATH EXCHANGE  03/05/2024   IR NEPHROSTOMY EXCHANGE LEFT  02/25/2022   IR NEPHROSTOMY EXCHANGE LEFT  03/27/2022   IR NEPHROSTOMY EXCHANGE LEFT  05/22/2022   IR NEPHROSTOMY EXCHANGE LEFT  07/24/2022   IR NEPHROSTOMY EXCHANGE LEFT  09/24/2022   IR NEPHROSTOMY EXCHANGE LEFT  11/21/2022   IR NEPHROSTOMY EXCHANGE LEFT  02/17/2023   IR NEPHROSTOMY EXCHANGE LEFT  04/06/2023   IR NEPHROSTOMY EXCHANGE LEFT  05/27/2023   IR NEPHROSTOMY  EXCHANGE LEFT  07/28/2023   IR NEPHROSTOMY PLACEMENT LEFT  12/27/2021   IVC FILTER INSERTION N/A 08/18/2020   Procedure: IVC FILTER INSERTION;  Surgeon: Jama Cordella MATSU, MD;  Location: [facility];  Service: Cardiovascular;  Laterality: N/A;   PERIPHERAL VASCULAR THROMBECTOMY Bilateral 01/03/2021   Procedure: PERIPHERAL VASCULAR THROMBECTOMY;  Surgeon: Marea Selinda RAMAN, MD;  Location: [facility];  Service: Cardiovascular;  Laterality: Bilateral;   PULMONARY THROMBECTOMY N/A 08/18/2020   Procedure: PULMONARY THROMBECTOMY;  Surgeon: Jama Cordella MATSU, MD;  Location: [facility];  Service: Cardiovascular;  Laterality: N/A;   TUBAL LIGATION     VISCERAL ANGIOGRAPHY N/A 07/18/2021   Procedure: VISCERAL ANGIOGRAPHY;  Surgeon: Marea Selinda RAMAN, MD;  Location: [facility];  Service: Cardiovascular;  Laterality: N/A;   Social History:  reports that she has never smoked. She has never used smokeless tobacco. She reports that she does not currently use alcohol . She reports that she does not use drugs.  No Known Allergies Family History  Problem Relation Age of Onset   Hypertension Mother    Diabetes Mother    Diabetes Father    Hypertension Father    High Cholesterol Father    Congestive Heart Failure Father    Breast cancer Cousin    Family history: Family history reviewed and not pertinent.  Prior to Admission medications   Medication Sig Start Date End Date Taking? Authorizing Provider  acetaminophen  (TYLENOL ) 500 MG tablet  Take 500 mg by mouth every 6 (six) hours as needed for mild pain or fever.    [provider]  albuterol  (VENTOLIN  HFA) 108 (90 Base) MCG/ACT inhaler Inhale 2 puffs into the lungs every 6 (six) hours as needed for wheezing or shortness of breath. Patient taking differently: Inhale 2 puffs into the lungs every 4 (four) hours as needed for wheezing or shortness of breath. 10/26/22   Laurita Pillion, MD  Ascorbic Acid  (VITAMIN C ) 1000 MG tablet  Take 1,000 mg by mouth daily.    [provider]  cyanocobalamin  100 MCG tablet Take 100 mcg by mouth daily.    [provider]  enoxaparin  (LOVENOX ) 30 MG/0.3ML injection Inject 0.3 mLs (30 mg total) into the skin every 12 (twelve) hours. 08/10/23   Barbarann Nest, MD  FEROSUL 325 (65 Fe) MG tablet Take 325 mg by mouth daily with breakfast. 08/14/22   [provider]  fluticasone  furoate-vilanterol (BREO ELLIPTA ) 100-25 MCG/ACT AEPB Inhale 1 puff into the lungs daily.    [provider]  fosfomycin (MONUROL ) 3 g PACK Take 3 g by mouth every other day. Take tomorrow afternoon with lunch. 09/03/23   Fausto Burnard LABOR, DO  furosemide  (LASIX ) 20 MG tablet Take 20 mg by mouth daily. 04/15/23   [provider]  loperamide  (IMODIUM ) 2 MG capsule Take 2 mg by mouth as needed for diarrhea or loose stools. 08/14/23   [provider]  Multiple Vitamin (MULTIVITAMIN WITH MINERALS) TABS tablet Take 1 tablet by mouth daily. 09/12/20   Jadine Toribio SQUIBB, MD  rosuvastatin  (CRESTOR ) 10 MG tablet TAKE 1 TABLET(10 MG) BY MOUTH DAILY 12/26/23   Donette Ellouise LABOR, FNP  simethicone  (MYLICON) 80 MG chewable tablet Chew 1 tablet (80 mg total) by mouth every 6 (six) hours as needed for flatulence. 04/08/23   Wouk, Devaughn Sayres, MD  trimethoprim  (TRIMPEX ) 100 MG tablet Take 1 tablet (100 mg total) by mouth at bedtime. 08/10/23   Barbarann Nest, MD    Physical Exam: Vitals:   04/06/24 0911 04/06/24 0918 04/06/24 1300 04/06/24 1323  BP:  (!) 102/46 (!) 100/43   Pulse:   70   Resp:   17   Temp:    98 F (36.7 C)  TempSrc:    Oral  SpO2:   99%   Weight: 90.7 kg     Height: 4' 11 (1.499 m)      Constitutional: appears age-appropriate, frail, chronically ill Eyes: PERRL, lids and conjunctivae normal ENMT: Mucous membranes are moist. Posterior pharynx clear of any exudate or lesions.  Poor dentition with multiple teeth loss. Hearing appropriate Neck: normal, supple, no masses,  no thyromegaly Respiratory: clear to auscultation bilaterally, no wheezing, no crackles. Normal respiratory effort. No accessory muscle use.  Cardiovascular: Regular rate and rhythm, no murmurs / rubs / gallops. No extremity edema. 2+ pedal pulses. No carotid bruits.  Abdomen: Morbidly obese abdomen, no tenderness, no masses palpated, no hepatosplenomegaly. Bowel sounds positive.  Musculoskeletal: no clubbing / cyanosis. No joint deformity upper and lower extremities. Good ROM, no contractures, no atrophy. Normal muscle tone.  Nephrostomy tube in place Skin: no rashes, lesions, ulcers. No induration Neurologic: Sensation intact. Strength 5/5 in all 4.  Psychiatric: Normal judgment and insight. Alert and oriented x 3. Normal mood.   EKG: Not indicated at this time Chest x-ray on Admission: Not indicated at this time  CT ABDOMEN PELVIS W CONTRAST Result Date: 04/06/2024 CLINICAL DATA:  68 year old female with left flank,  back, abdominal pain. Nephrostomy tube with purulent drainage, erythema. EXAM: CT ABDOMEN AND PELVIS WITH CONTRAST TECHNIQUE: Multidetector CT imaging of the abdomen and pelvis was performed using the standard protocol following bolus administration of intravenous contrast. RADIATION DOSE REDUCTION: This exam was performed according to the departmental dose-optimization program which includes automated exposure control, adjustment of the mA and/or kV according to patient size and/or use of iterative reconstruction technique. CONTRAST:  OMNIPAQUE  IOHEXOL  300 MG/ML  SOLN COMPARISON:  CT Abdomen and Pelvis 07/24/2023. Report of nephrostomy catheter exchange 03/05/2024. FINDINGS: Lower chest: Stable elevation of the right hemidiaphragm, probable normal variant. Calcified coronary artery atherosclerosis. Heart size remains normal. No pericardial or pleural effusion. Lung base atelectasis and scarring is stable, maximal in the lingula and the right lower lobe. Hepatobiliary: Negative  liver and gallbladder. Pancreas: Negative. Spleen: Stable, negative. Adrenals/Urinary Tract: Normal adrenal glands. Previously seen right renal lower pole calculus now located in the central renal pelvis and measures 6 mm. But no right hydronephrosis and renal contrast excretion appears maintained on the delayed images. Diminutive right ureter, negative to the bladder. Percutaneous left nephrostomy in place with superimposed left double-J ureteral stent. Catheters appear appropriately position. Moderate inflammatory stranding throughout the left renal pelvis, surrounding the left ureter, and surrounding the decompressed urinary bladder where the distal pigtail terminates. No gas or fluid tracking along the percutaneous catheter, the left flank body wall appears stable. No left nephrogram striations to indicate overt pyelonephritis. No pararenal abscess or fluid. Stomach/Bowel: Mildly increased retained stool in the large bowel, otherwise stable bowel in the abdomen and pelvis. Appendix remains normal on series 3, image 65. No dilated bowel loops. Chronic rectus muscle diastasis, no bowel hernia. Decompressed stomach and duodenum. No pneumoperitoneum or free fluid. Vascular/Lymphatic: Aortoiliac calcified atherosclerosis. Major arterial structures in the abdomen and pelvis remain patent. Normal caliber abdominal aorta. IVC filter is stable. Portal venous system appears patent. Small but progressed left renal retroperitoneal lymph nodes series 3, image 46, up to 8 mm short axis now (previously 5 mm and smaller). Retroaortic left renal vein incidentally noted, patent. Reproductive: Stable, negative. Other: Chronic presacral stranding appears stable. No pelvic free fluid. Musculoskeletal: Lumbar spine facet arthropathy, some levels of facet ankylosis. Lower thoracic spine Diffuse idiopathic skeletal hyperostosis (DISH) and subsequent interbody ankylosis. Chronic severe hip joint degeneration, femoral head remodeling. No  acute osseous abnormality identified. IMPRESSION: 1. Left percutaneous nephrostomy and double-J ureteral stent in place with satisfactory positioning. Left renal collecting system, renal hilum, left ureter, and bladder appear acutely inflamed. But no associated abscess, fluid collection, and no overt left pyelonephritis. Adjacent small but reactive appearing left retroperitoneal lymph nodes. 2. Right renal 6 mm calculus now located centrally in the right renal pelvis. But no findings of acute obstructive uropathy. 3. Otherwise stable since January CT appearance of the Abdomen and Pelvis. Aortic Atherosclerosis (ICD10-I70.0). Electronically Signed   By: VEAR Hurst M.D.   On: 04/06/2024 11:12   Labs on Admission: I have personally reviewed following labs  CBC: Recent Labs  Lab 04/06/24 0933  WBC 5.0  NEUTROABS 3.5  HGB 9.2*  HCT 31.7*  MCV 87.1  PLT 307   Basic Metabolic Panel: Recent Labs  Lab 04/06/24 0933  NA 139  K 4.6  CL 105  CO2 24  GLUCOSE 109*  BUN 18  CREATININE 1.25*  CALCIUM  9.1   GFR: Estimated Creatinine Clearance: 41.7 mL/min (A) (by C-G formula based on SCr of 1.25 mg/dL (H)).  Liver Function Tests:  Recent Labs  Lab 04/06/24 0933  AST 18  ALT 9  ALKPHOS 50  BILITOT 0.9  PROT 6.8  ALBUMIN 3.3*   Urine analysis:    Component Value Date/Time   COLORURINE YELLOW (A) 04/06/2024 0906   APPEARANCEUR CLOUDY (A) 04/06/2024 0906   APPEARANCEUR Cloudy (A) 01/03/2022 1443   LABSPEC >1.046 (H) 04/06/2024 0906   PHURINE 7.0 04/06/2024 0906   GLUCOSEU NEGATIVE 04/06/2024 0906   HGBUR SMALL (A) 04/06/2024 0906   BILIRUBINUR NEGATIVE 04/06/2024 0906   BILIRUBINUR Negative 01/03/2022 1443   KETONESUR 5 (A) 04/06/2024 0906   PROTEINUR 100 (A) 04/06/2024 0906   NITRITE NEGATIVE 04/06/2024 0906   LEUKOCYTESUR LARGE (A) 04/06/2024 0906   This document was prepared using Dragon Voice Recognition software and may include unintentional dictation errors.  Dr. Sherre [facility]  If 7PM-7AM, please contact overnight-coverage provider If 7AM-7PM, please contact day attending provider [email or web address]  04/06/2024, 3:46 PM

## 2024-04-06 NOTE — ED Notes (Signed)
 RN attempted get In and out cath.

## 2024-04-07 ENCOUNTER — Inpatient Hospital Stay

## 2024-04-07 ENCOUNTER — Ambulatory Visit: Admitting: Urology

## 2024-04-07 DIAGNOSIS — Z1612 Extended spectrum beta lactamase (ESBL) resistance: Secondary | ICD-10-CM | POA: Diagnosis not present

## 2024-04-07 DIAGNOSIS — B9629 Other Escherichia coli [E. coli] as the cause of diseases classified elsewhere: Secondary | ICD-10-CM | POA: Diagnosis not present

## 2024-04-07 DIAGNOSIS — N390 Urinary tract infection, site not specified: Secondary | ICD-10-CM | POA: Diagnosis not present

## 2024-04-07 LAB — BASIC METABOLIC PANEL WITH GFR
Anion gap: 3 — ABNORMAL LOW (ref 5–15)
BUN: 16 mg/dL (ref 8–23)
CO2: 21 mmol/L — ABNORMAL LOW (ref 22–32)
Calcium: 7.9 mg/dL — ABNORMAL LOW (ref 8.9–10.3)
Chloride: 115 mmol/L — ABNORMAL HIGH (ref 98–111)
Creatinine, Ser: 0.97 mg/dL (ref 0.44–1.00)
GFR, Estimated: >60 mL/min (ref >60)
Glucose, Bld: 95 mg/dL (ref 70–99)
Potassium: 3.9 mmol/L (ref 3.5–5.1)
Sodium: 139 mmol/L (ref 135–145)

## 2024-04-07 LAB — CBC
HCT: 26.4 % — ABNORMAL LOW (ref 36.0–46.0)
Hemoglobin: 7.8 g/dL — ABNORMAL LOW (ref 12.0–15.0)
MCH: 25.7 pg — ABNORMAL LOW (ref 26.0–34.0)
MCHC: 29.5 g/dL — ABNORMAL LOW (ref 30.0–36.0)
MCV: 86.8 fL (ref 80.0–100.0)
Platelets: 274 K/uL (ref 150–400)
RBC: 3.04 MIL/uL — ABNORMAL LOW (ref 3.87–5.11)
RDW: 16.9 % — ABNORMAL HIGH (ref 11.5–15.5)
WBC: 4.1 K/uL (ref 4.0–10.5)
nRBC: 0 % (ref 0.0–0.2)

## 2024-04-07 MED ORDER — VANCOMYCIN HCL 750 MG/150ML IV SOLN
750.0000 mg | INTRAVENOUS | Status: DC
  Administered 2024-04-07: 750 mg via INTRAVENOUS
  Filled 2024-04-07: qty 150

## 2024-04-07 MED ORDER — FUROSEMIDE 20 MG PO TABS
20.0000 mg | ORAL_TABLET | Freq: Every day | ORAL | Status: DC
  Administered 2024-04-07 – 2024-04-08 (×2): 20 mg via ORAL
  Filled 2024-04-07 (×2): qty 1

## 2024-04-07 MED ORDER — SODIUM CHLORIDE 0.9 % IV SOLN
1.0000 g | Freq: Three times a day (TID) | INTRAVENOUS | Status: DC
  Administered 2024-04-07 – 2024-04-09 (×5): 1 g via INTRAVENOUS
  Filled 2024-04-07 (×6): qty 20

## 2024-04-07 NOTE — Evaluation (Signed)
 Occupational Therapy Evaluation Patient Details Name: Courtney Mack MRN: [id number] DOB: 05/03/1956 Today's Date: 04/07/2024   History of Present Illness   Pt is a 68 y/o presenting with fever and dysuria in setting of UTI. PMH: hx of DVT/PE, HTN, GERD, L nephrostomy tube, HLD     Clinical Impressions PTA, pt lives with daughter, typically ambulatory with Rollator and mostly Modified Independent with ADLs using adaptive methods. Pt reports managing meds while daughter manages other IADLs. Pt presents now with minor deficits in dynamic standing balance and endurance. Pt requiring Mod A for bed mobility (typically sleeps in recliner at home), Min A to stand and CGA for in-room mobility using RW. Pt requires Min-Mod A for ADLs w/o typically used AE. Pt reports family able to provide needed assist and politely declines need for OT follow up upon DC.     If plan is discharge home, recommend the following:   A little help with walking and/or transfers;A little help with bathing/dressing/bathroom;Direct supervision/assist for medications management     Functional Status Assessment   Patient has had a recent decline in their functional status and demonstrates the ability to make significant improvements in function in a reasonable and predictable amount of time.     Equipment Recommendations   None recommended by OT     Recommendations for Other Services         Precautions/Restrictions   Precautions Precautions: Fall;Other (comment) Precaution/Restrictions Comments: L nephrostomy tube Restrictions Weight Bearing Restrictions Per Provider Order: No     Mobility Bed Mobility Overal bed mobility: Needs Assistance Bed Mobility: Supine to Sit     Supine to sit: Mod assist, Used rails, HOB elevated     General bed mobility comments: assist to bring LE to EOB and scoot hips. Pt reports sleeping in recliner at baseline    Transfers Overall transfer level: Needs  assistance Equipment used: Rolling walker (2 wheels) Transfers: Sit to/from Stand Sit to Stand: Min assist           General transfer comment: Min A from bedside. reports using recliner to assist in standing more easily at home      Balance Overall balance assessment: Needs assistance Sitting-balance support: No upper extremity supported, Feet supported Sitting balance-Leahy Scale: Good     Standing balance support: Bilateral upper extremity supported, During functional activity Standing balance-Leahy Scale: Poor                             ADL either performed or assessed with clinical judgement   ADL Overall ADL's : Needs assistance/impaired Eating/Feeding: Independent   Grooming: Set up;Sitting;Wash/dry face   Upper Body Bathing: Minimal assistance;Sitting   Lower Body Bathing: Minimal assistance;Sitting/lateral leans;Sit to/from stand Lower Body Bathing Details (indicate cue type and reason): assist for posterior region but pt able to complete anterior peri care in standing using RW for support Upper Body Dressing : Set up;Sitting   Lower Body Dressing: Sitting/lateral leans;Sit to/from stand;Moderate assistance Lower Body Dressing Details (indicate cue type and reason): assist for socks; does not typically wear at home. assist to don brief around B feet but pt reports typically using cane to manage this task at home Toilet Transfer: Contact guard assist;Minimal assistance;Ambulation;Rolling walker (2 wheels)   Toileting- Clothing Manipulation and Hygiene: Minimal assistance;Sitting/lateral lean;Sit to/from stand       Functional mobility during ADLs: Contact guard assist;Rolling walker (2 wheels)       Vision  Baseline Vision/History: 1 Wears glasses Ability to See in Adequate Light: 0 Adequate Patient Visual Report: No change from baseline Vision Assessment?: No apparent visual deficits     Perception         Praxis         Pertinent  Vitals/Pain Pain Assessment Pain Assessment: No/denies pain     Extremity/Trunk Assessment Upper Extremity Assessment Upper Extremity Assessment: Overall WFL for tasks assessed;Right hand dominant   Lower Extremity Assessment Lower Extremity Assessment: Defer to PT evaluation   Cervical / Trunk Assessment Cervical / Trunk Assessment: Normal   Communication Communication Communication: No apparent difficulties   Cognition Arousal: Alert Behavior During Therapy: WFL for tasks assessed/performed Cognition: Cognition impaired     Awareness: Intellectual awareness intact Memory impairment (select all impairments): Working [employer] fast streamer Attention impairment (select first level of impairment): Selective attention   OT - Cognition Comments: minor attention deficits, initial difficulty problem solving (holding on to clean brief while attempting to complete bed mobility but then recognized and corrected)                 Following commands: Intact       Cueing  General Comments   Cueing Techniques: Verbal cues      Exercises     Shoulder Instructions      Home Living Family/patient expects to be discharged to:: Private residence Living Arrangements: Children Available Help at Discharge: Family;Available 24 hours/day Type of Home: Apartment Home Access: Stairs to enter Entrance Stairs-Number of Steps: 1 Entrance Stairs-Rails: None Home Layout: One level     Bathroom Shower/Tub: Tub/shower unit;Sponge bathes at baseline   Bathroom Toilet: Standard (BSC over toilet) Bathroom Accessibility: Yes   Home Equipment: Rollator (4 wheels);BSC/3in1;Shower seat;Wheelchair - manual;Cane - quad;Cane - single point;Adaptive equipment Adaptive Equipment: Reacher Additional Comments: sponge bathes at baseline. sleeps in a recliner at home      Prior Functioning/Environment Prior Level of Function : Needs assist             Mobility Comments: amb in house with rollator, has  manual w/c for community distances ADLs Comments: Mod I for ADLs, sponge bathing at baseline w/ daughter assisting to wash hair. assist with compression stockings. Daughter manages most IADLs such as meals but pt reports managing own meds.    OT Problem List: Decreased activity tolerance;Impaired balance (sitting and/or standing);Decreased cognition   OT Treatment/Interventions: Self-care/ADL training;Therapeutic exercise;Energy conservation;DME and/or AE instruction;Therapeutic activities;Patient/family education;Balance training      OT Goals(Current goals can be found in the care plan section)   Acute Rehab OT Goals Patient Stated Goal: go home when ready OT Goal Formulation: With patient Time For Goal Achievement: 04/21/24 Potential to Achieve Goals: Good ADL Goals Pt Will Perform Lower Body Bathing: with modified independence;sit to/from stand;sitting/lateral leans Pt Will Transfer to Toilet: with modified independence;ambulating Pt Will Perform Toileting - Clothing Manipulation and hygiene: with modified independence;sit to/from stand;sitting/lateral leans   OT Frequency:  Min 2X/week    Co-evaluation              AM-PAC OT 6 Clicks Daily Activity     Outcome Measure Help from another person eating meals?: None Help from another person taking care of personal grooming?: A Little Help from another person toileting, which includes using toliet, bedpan, or urinal?: A Little Help from another person bathing (including washing, rinsing, drying)?: A Little Help from another person to put on and taking off regular upper body clothing?: A Little Help from  another person to put on and taking off regular lower body clothing?: A Lot 6 Click Score: 18   End of Session Equipment Utilized During Treatment: Rolling walker (2 wheels) Nurse Communication: Mobility status  Activity Tolerance: Patient tolerated treatment well Patient left: in chair;with call bell/phone within  reach;with chair alarm set  OT Visit Diagnosis: Other abnormalities of gait and mobility (R26.89)                Time: 9140-9080 OT Time Calculation (min): 20 min Charges:  OT General Charges $OT Visit: 1 Visit OT Evaluation $OT Eval Moderate Complexity: 1 Mod  Courtney Mack, OTR/L Acute Rehab Services Office: [phone]   Courtney Mack 04/07/2024, 10:06 AM

## 2024-04-07 NOTE — Progress Notes (Signed)
 PROGRESS NOTE    Courtney Mack  FMW:[id number] DOB: [birth year] DOA: 04/06/2024 [facility]CP: [facility]  Chief Complaint  Courtney Mack presents with   left sided pain    Hospital Course:  Courtney Mack is a 68 year old female with history of DVT/PE on Lovenox , hypertension, GERD, left nephrostomy tube status, hyperlipidemia, who presents to the ED for chief concerns of dysuria for 2 days and subjective fever. Courtney Mack admitted for UTI with history of ESBL E. coli, hypotension, AKI   Subjective: Courtney Mack was examined at the bedside, new to me today.  States dysuria and abdominal pain have been improving. Reports mild SOB with exertion, discontinue IV fluids, pending CXR   Objective: Vitals:   04/06/24 2031 04/06/24 2322 04/07/24 0359 04/07/24 0812  BP: (!) 117/41 (!) 112/47 (!) 115/46 (!) 126/39  Pulse: 81 70 74 76  Resp: (!) 24 18 16 18   Temp: 98.4 F (36.9 C) 98.6 F (37 C) 98.6 F (37 C) 98.7 F (37.1 C)  TempSrc:  Oral Oral   SpO2: 96% 98% 98% 98%  Weight:      Height:       No intake or output data in the 24 hours ending 04/07/24 0956 Filed Weights   04/06/24 0911  Weight: 90.7 kg    Examination: Constitutional: appears age-appropriate, frail, chronically ill Neck: normal, supple, no masses, no thyromegaly Respiratory: clear to auscultation bilaterally, no wheezing, no crackles. Normal respiratory effort. No accessory muscle use.  Cardiovascular: Regular rate and rhythm, no murmurs / rubs / gallops. No extremity edema. 2+ pedal pulses. No carotid bruits.  Abdomen: Morbidly obese abdomen, no tenderness, no masses palpated, no hepatosplenomegaly. Bowel sounds positive.  Musculoskeletal: no clubbing / cyanosis. No joint deformity upper and lower extremities. Good ROM, no contractures, no atrophy. Normal muscle tone.  Nephrostomy tube in place Skin: no rashes, lesions, ulcers. No induration Neurologic: Sensation intact. Strength 5/5 in all 4.   Psychiatric: Normal judgment and insight. Alert and oriented x 3. Normal mood  Assessment & Plan:  UTI due to extended-spectrum beta lactamase (ESBL) producing Escherichia coli CT left percutaneous nephrostomy and double-J ureteral stent in place.  No abscess/fluid collection.  Right renal 6 mm calculus located centrally in the right renal pelvis.  No findings of obstructive uropathy. On IV Meropenem , discontinue Vancomycin  Urine culture in process Discussed with Urology, no indication for nephrostomy tube exchange On Trimethoprim  for ppx at home   Hypotension - resolved S/p IV fluids   AKI - resolved S/p IV fluids   Iron  deficiency anemia Acute drop in Hb Resume home iron  supplementation with breakfast  Hb 9.2 -> 7.8, s/p IV fluids Monitor Hb  Chronic diastolic CHF - s/p aggressive IV fluids on presentation - CXR pending - Resume home lasix    Dyslipidemia Home rosuvastatin  10   Obesity, Class III, BMI 40-49.9 (morbid obesity)   Chronic obstructive pulmonary disease (COPD) (HCC) Not in acute exacerbation Home Breo Ellipta  1 puff inhalation daily resumed Albuterol  inhalation every 6 hours prn   Generalized weakness Fall precaution PT/OT rec Home PT  DVT prophylaxis: Lovenox  SQ   Code Status: Full Code Disposition:  Home with home health  Consultants:  None  Procedures:  None  Antimicrobials:  Anti-infectives (From admission, onward)    Start     Dose/Rate Route Frequency Ordered Stop   04/07/24 1200  vancomycin  (VANCOREADY) IVPB 750 mg/150 mL        750 mg 150 mL/hr over 60 Minutes Intravenous Every  24 hours 04/07/24 0755     04/06/24 1245  vancomycin  (VANCOREADY) IVPB 2000 mg/400 mL        2,000 mg 200 mL/hr over 120 Minutes Intravenous  Once 04/06/24 1146 04/06/24 1457   04/06/24 1200  meropenem  (MERREM ) 1 g in sodium chloride  0.9 % 100 mL IVPB        1 g 200 mL/hr over 30 Minutes Intravenous Every 12 hours 04/06/24 1146     04/06/24 1153  vancomycin   variable dose per unstable renal function (pharmacist dosing)         Does not apply See admin instructions 04/06/24 1153         Data Reviewed: I have personally reviewed following labs and imaging studies CBC: Recent Labs  Lab 04/06/24 0933 04/07/24 0347  WBC 5.0 4.1  NEUTROABS 3.5  --   HGB 9.2* 7.8*  HCT 31.7* 26.4*  MCV 87.1 86.8  PLT 307 274   Basic Metabolic Panel: Recent Labs  Lab 04/06/24 0933 04/07/24 0347  NA 139 139  K 4.6 3.9  CL 105 115*  CO2 24 21*  GLUCOSE 109* 95  BUN 18 16  CREATININE 1.25* 0.97  CALCIUM  9.1 7.9*   GFR: Estimated Creatinine Clearance: 53.7 mL/min (by C-G formula based on SCr of 0.97 mg/dL). Liver Function Tests: Recent Labs  Lab 04/06/24 0933  AST 18  ALT 9  ALKPHOS 50  BILITOT 0.9  PROT 6.8  ALBUMIN 3.3*   CBG: No results for input(s): GLUCAP in the last 168 hours.  No results found for this or any previous visit (from the past 240 hours).   Radiology Studies: CT ABDOMEN PELVIS W CONTRAST Result Date: 04/06/2024 CLINICAL DATA:  68 year old female with left flank, back, abdominal pain. Nephrostomy tube with purulent drainage, erythema. EXAM: CT ABDOMEN AND PELVIS WITH CONTRAST TECHNIQUE: Multidetector CT imaging of the abdomen and pelvis was performed using the standard protocol following bolus administration of intravenous contrast. RADIATION DOSE REDUCTION: This exam was performed according to the departmental dose-optimization program which includes automated exposure control, adjustment of the mA and/or kV according to Courtney Mack size and/or use of iterative reconstruction technique. CONTRAST:  OMNIPAQUE  IOHEXOL  300 MG/ML  SOLN COMPARISON:  CT Abdomen and Pelvis 07/24/2023. Report of nephrostomy catheter exchange 03/05/2024. FINDINGS: Lower chest: Stable elevation of the right hemidiaphragm, probable normal variant. Calcified coronary artery atherosclerosis. Heart size remains normal. No pericardial or pleural effusion.  Lung base atelectasis and scarring is stable, maximal in the lingula and the right lower lobe. Hepatobiliary: Negative liver and gallbladder. Pancreas: Negative. Spleen: Stable, negative. Adrenals/Urinary Tract: Normal adrenal glands. Previously seen right renal lower pole calculus now located in the central renal pelvis and measures 6 mm. But no right hydronephrosis and renal contrast excretion appears maintained on the delayed images. Diminutive right ureter, negative to the bladder. Percutaneous left nephrostomy in place with superimposed left double-J ureteral stent. Catheters appear appropriately position. Moderate inflammatory stranding throughout the left renal pelvis, surrounding the left ureter, and surrounding the decompressed urinary bladder where the distal pigtail terminates. No gas or fluid tracking along the percutaneous catheter, the left flank body wall appears stable. No left nephrogram striations to indicate overt pyelonephritis. No pararenal abscess or fluid. Stomach/Bowel: Mildly increased retained stool in the large bowel, otherwise stable bowel in the abdomen and pelvis. Appendix remains normal on series 3, image 65. No dilated bowel loops. Chronic rectus muscle diastasis, no bowel hernia. Decompressed stomach and duodenum. No pneumoperitoneum or free fluid.  Vascular/Lymphatic: Aortoiliac calcified atherosclerosis. Major arterial structures in the abdomen and pelvis remain patent. Normal caliber abdominal aorta. IVC filter is stable. Portal venous system appears patent. Small but progressed left renal retroperitoneal lymph nodes series 3, image 46, up to 8 mm short axis now (previously 5 mm and smaller). Retroaortic left renal vein incidentally noted, patent. Reproductive: Stable, negative. Other: Chronic presacral stranding appears stable. No pelvic free fluid. Musculoskeletal: Lumbar spine facet arthropathy, some levels of facet ankylosis. Lower thoracic spine Diffuse idiopathic skeletal  hyperostosis (DISH) and subsequent interbody ankylosis. Chronic severe hip joint degeneration, femoral head remodeling. No acute osseous abnormality identified. IMPRESSION: 1. Left percutaneous nephrostomy and double-J ureteral stent in place with satisfactory positioning. Left renal collecting system, renal hilum, left ureter, and bladder appear acutely inflamed. But no associated abscess, fluid collection, and no overt left pyelonephritis. Adjacent small but reactive appearing left retroperitoneal lymph nodes. 2. Right renal 6 mm calculus now located centrally in the right renal pelvis. But no findings of acute obstructive uropathy. 3. Otherwise stable since January CT appearance of the Abdomen and Pelvis. Aortic Atherosclerosis (ICD10-I70.0). Electronically Signed   By: VEAR Hurst M.D.   On: 04/06/2024 11:12    Scheduled Meds:  vitamin C   1,000 mg Oral Daily   enoxaparin   30 mg Subcutaneous Q12H   ferrous sulfate   325 mg Oral Q breakfast   fluticasone  furoate-vilanterol  1 puff Inhalation Daily   pantoprazole   40 mg Oral BID   rosuvastatin   10 mg Oral QHS   vancomycin  variable dose per unstable renal function (pharmacist dosing)   Does not apply See admin instructions   cyanocobalamin   100 mcg Oral Daily   Continuous Infusions:  sodium chloride  150 mL/hr at 04/07/24 0815   meropenem  (MERREM ) IV 1 g (04/07/24 0821)   vancomycin        LOS: 1 day  MDM: Courtney Mack is high risk for one or more organ failure.  They necessitate ongoing hospitalization for continued IV therapies and subsequent lab monitoring. Total time spent interpreting labs and vitals, reviewing the medical record, coordinating care amongst consultants and care team members, directly assessing and discussing care with the Courtney Mack and/or family: 55 min Laree Lock, MD [facility]  To contact the attending physician between 7A-7P please use Epic Chat. To contact the covering physician during after hours 7P-7A, please review  Amion.  04/07/2024, 9:56 AM   *This document has been created with the assistance of dictation software. Please excuse typographical errors. *

## 2024-04-07 NOTE — TOC Initial Note (Signed)
 Transition of Care [facility]) - Initial/Assessment Note    Patient Details  Name: Courtney Mack MRN: [id number] Date of Birth: 06/06/1956  [facility]) CM/SW Contact:    Alfonso Rummer, LCSW Phone Number: 04/07/2024, 3:28 PM  Clinical Narrative:                 KEN DELENA Rummer completed readmit screen and discussed home health recommendations with Ms. Bartram. LCSW A Rishan Oyama appropriately informed Ms. Code of home health recommendations. Ms. Christenson declined home health recommendations twice. Ms. Igoe reports she lives with daughter and has adequate family support. No additional TOC needs were discovered during visit. Please contact TOC should additional needs arise.   Expected Discharge Plan: Home/Self Care Barriers to Discharge: Continued Medical Work up   Patient Goals and CMS Choice     Choice offered to / list presented to : Patient (Pt is declining home health recommendation)      Expected Discharge Plan and Services       Living arrangements for the past 2 months: Single Family Home                                      Prior Living Arrangements/Services Living arrangements for the past 2 months: Single Family Home Lives with:: Self, Adult Children          Need for Family Participation in Patient Care: Yes (Comment) Care giver support system in place?: Yes (comment)      Activities of Daily Living      Permission Sought/Granted                  Emotional Assessment Appearance:: Appears stated age Attitude/Demeanor/Rapport: Engaged Affect (typically observed): Appropriate Orientation: : Oriented to Self, Oriented to Place, Oriented to  Time, Oriented to Situation   Psych Involvement: No (comment)  Admission diagnosis:  Flank pain [R10.A0] Urinary tract infection without hematuria, site unspecified [N39.0] Acute left-sided low back pain without sciatica [M54.50] UTI due to extended-spectrum beta lactamase (ESBL)  producing Escherichia coli [N39.0, B96.29, Z16.12] Infection associated with nephrostomy catheter, initial encounter [T83.512A] Left lower quadrant abdominal pain [R10.32] H/O insertion of nephrostomy tube [Z98.890] Patient Active Problem List   Diagnosis Date Noted   UTI due to extended-spectrum beta lactamase (ESBL) producing Escherichia coli 04/06/2024   COPD exacerbation (HCC) 08/27/2023   Other pulmonary embolism without acute cor pulmonale (HCC) 08/07/2023   Chronic deep vein thrombosis (DVT) of proximal vein of right lower extremity (HCC) 08/05/2023   Acute pulmonary embolism (HCC) 08/04/2023   Dysuria 06/22/2023   SOB (shortness of breath) 06/18/2023   HTN (hypertension) 06/18/2023   Complicated UTI (urinary tract infection) 05/26/2023   Diarrhea 05/26/2023   Blocked nephrostomy tube with hydronephrosis and hydroureter (HCC) 04/05/2023   Hypertension    Pressure injury of skin 02/11/2023   Severe sepsis with acute organ dysfunction (HCC) 02/10/2023   Obesity (BMI 30-39.9) 11/22/2022   Sepsis due to gram-negative UTI (HCC) 11/21/2022   Acute pyelonephritis 11/21/2022   Dyspnea 11/21/2022   Chest pain 11/21/2022   Hypokalemia 10/25/2022   Chronic obstructive pulmonary disease (COPD) (HCC) 10/24/2022   Iron  deficiency anemia 10/24/2022   Symptomatic anemia 08/08/2022   COPD (chronic obstructive pulmonary disease) (HCC) 08/07/2022   Sepsis due to pneumonia (HCC) 06/12/2022   Multifocal pneumonia 06/12/2022   GERD without esophagitis 06/12/2022   Generalized weakness 06/12/2022   Nephrostomy  status (HCC) 06/12/2022   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) 04/16/2022   GI bleed 03/14/2022   Acute on chronic anemia 02/24/2022   Hypotension 02/24/2022   Acute cystitis with hematuria    Lymphedema 02/08/2022   Hematuria 12/24/2021   History of pulmonary embolus (PE) 12/24/2021   Chronic radiation cystitis 11/17/2021   Hydronephrosis of left kidney     Gastric antral vascular ectasia    Complicated urinary tract infection 09/13/2021   AKI (acute kidney injury) 09/13/2021   Melena 07/18/2021   Acute on chronic blood loss anemia 07/18/2021   HLD (hyperlipidemia) 06/20/2021   Chronic diastolic CHF (congestive heart failure) (HCC) 06/20/2021   Risk factors for obstructive sleep apnea 11/10/2020   IDA (iron  deficiency anemia) 09/21/2020   Endometrial cancer (HCC) 09/13/2020   Recurrent deep vein thrombosis (DVT) (HCC) 09/06/2020   Obesity, Class III, BMI 40-49.9 (morbid obesity) (HCC) 08/23/2020   Dyslipidemia 08/23/2020   PCP:  [facility]:   [id number] [place], [place] - [address] AT [facility] [address] [place] [address] Phone: [phone] Fax: [phone]     Social Drivers of Health (SDOH) Social History: SDOH Screenings   Food Insecurity: No Food Insecurity (04/06/2024)  Housing: Low Risk  (04/06/2024)  Transportation Needs: No Transportation Needs (04/06/2024)  Utilities: Not At Risk (04/06/2024)  Depression (PHQ2-9): Low Risk  (04/11/2022)  Social Connections: Unknown (04/06/2024)  Tobacco Use: Low Risk  (04/06/2024)   SDOH Interventions:     Readmission Risk Interventions    04/07/2024    3:24 PM 08/07/2023   10:26 AM 07/24/2023   11:14 AM  Readmission Risk Prevention Plan  Transportation Screening Complete  Complete  PCP or Specialist Appt within 3-5 Days Complete    Social Work Consult for Recovery Care Planning/Counseling Complete    Palliative Care Screening Not Applicable    Medication Review Oceanographer) Complete Complete Complete  PCP or Specialist appointment within 3-5 days of discharge  Complete Complete  SW Recovery Care/Counseling Consult  Complete Complete  Palliative Care Screening  Not Applicable Not Applicable  Skilled Nursing Facility  Not Applicable Not Applicable

## 2024-04-07 NOTE — Evaluation (Signed)
 Physical Therapy Evaluation Patient Details Name: Courtney Mack MRN: [id number] DOB: 11-29-1955 Today's Date: 04/07/2024  History of Present Illness  Pt is a 68 y/o F admitted on 04/06/24 after presenting with c/o L sided pain & dysuria. Pt is being treated for UTI 2/2 ESBL producing E. Coli. PMH: DVT/PE on lovenox , HTN, GERD, L nephrostomy tube, HLD, acute BLE DVT, acute massive PE, anemia, ARF, CHF, covid-19, DM, endometrial CA, GAVE, obesity  Clinical Impression  Pt seen for PT tx with pt agreeable, sister present. Prior to admission pt ambulated short distances with rollator, denies falls. On this date, pt is able to complete sit<>stand from recliner with CGA, ambulate around bed & back with RW & CGA with decreased gait speed. Pt notes moderate fatigue after gait, also reports just finishing with OT session as well. Will continue to follow pt acutely to progress endurance, balance & gait with LRAD.        If plan is discharge home, recommend the following: A little help with walking and/or transfers;A little help with bathing/dressing/bathroom;Assistance with cooking/housework;Assist for transportation;Help with stairs or ramp for entrance   Can travel by private vehicle        Equipment Recommendations None recommended by PT  Recommendations for Other Services       Functional Status Assessment Patient has had a recent decline in their functional status and demonstrates the ability to make significant improvements in function in a reasonable and predictable amount of time.     Precautions / Restrictions Precautions Precautions: Fall;Other (comment) Precaution/Restrictions Comments: L nephrostomy tube Restrictions Weight Bearing Restrictions Per Provider Order: No      Mobility  Bed Mobility               General bed mobility comments: not tested, pt received & left sitting in recliner; sleeps in recliner at baseline    Transfers Overall transfer level: Needs  assistance Equipment used: Rolling walker (2 wheels) Transfers: Sit to/from Stand Sit to Stand: Contact guard assist           General transfer comment: sit>stand from recliner    Ambulation/Gait Ambulation/Gait assistance: Contact guard assist Gait Distance (Feet): 18 Feet Assistive device: Rolling walker (2 wheels) Gait Pattern/deviations: Decreased step length - left, Decreased step length - right, Decreased stride length Gait velocity: decreased     General Gait Details: ambulates around bed & back with RW & CGA, decreased gait speed, pushing RW slightly out in front of her vs ambulating within base of AD  Stairs            Wheelchair Mobility     Tilt Bed    Modified Rankin (Stroke Patients Only)       Balance Overall balance assessment: Needs assistance Sitting-balance support: No upper extremity supported, Feet supported Sitting balance-Leahy Scale: Good     Standing balance support: Bilateral upper extremity supported, During functional activity Standing balance-Leahy Scale: Fair                               Pertinent Vitals/Pain Pain Assessment Pain Assessment: No/denies pain    Home Living Family/patient expects to be discharged to:: Private residence Living Arrangements: Children Available Help at Discharge: Family;Available 24 hours/day Type of Home: Apartment Home Access: Stairs to enter Entrance Stairs-Rails: None Entrance Stairs-Number of Steps: 1   Home Layout: One level Home Equipment: Rollator (4 wheels);BSC/3in1;Shower seat;Wheelchair - manual;Cane - quad;Cane - single point;Adaptive  equipment Additional Comments: sponge bathes at baseline. sleeps in a recliner at home    Prior Function Prior Level of Function : Needs assist             Mobility Comments: amb in house with rollator, has manual w/c for community distances, sleeps in recliner at home ADLs Comments: Mod I for ADLs, sponge bathing at baseline w/  daughter assisting to wash hair. assist with compression stockings. Daughter manages most IADLs such as meals but pt reports managing own meds.     Extremity/Trunk Assessment   Upper Extremity Assessment Upper Extremity Assessment: Defer to OT evaluation;Overall WFL for tasks assessed    Lower Extremity Assessment Lower Extremity Assessment: Generalized weakness    Cervical / Trunk Assessment Cervical / Trunk Assessment: Normal  Communication   Communication Communication: No apparent difficulties    Cognition Arousal: Alert Behavior During Therapy: WFL for tasks assessed/performed   PT - Cognitive impairments: No apparent impairments                       PT - Cognition Comments: pleasant Following commands: Intact       Cueing Cueing Techniques: Verbal cues     General Comments      Exercises     Assessment/Plan    PT Assessment Patient needs continued PT services  PT Problem List Decreased strength;Decreased activity tolerance;Decreased balance;Decreased mobility;Decreased knowledge of use of DME       PT Treatment Interventions DME instruction;Balance training;Gait training;Stair training;Neuromuscular re-education;Functional mobility training;Therapeutic exercise;Manual techniques;Therapeutic activities;Patient/family education    PT Goals (Current goals can be found in the Care Plan section)  Acute Rehab PT Goals Patient Stated Goal: get better PT Goal Formulation: With patient Time For Goal Achievement: 04/21/24 Potential to Achieve Goals: Good    Frequency Min 2X/week     Co-evaluation               AM-PAC PT 6 Clicks Mobility  Outcome Measure Help needed turning from your back to your side while in a flat bed without using bedrails?: A Little Help needed moving from lying on your back to sitting on the side of a flat bed without using bedrails?: A Lot Help needed moving to and from a bed to a chair (including a wheelchair)?: A  Little Help needed standing up from a chair using your arms (e.g., wheelchair or bedside chair)?: A Little Help needed to walk in hospital room?: A Little Help needed climbing 3-5 steps with a railing? : A Lot 6 Click Score: 16    End of Session   Activity Tolerance: Patient limited by fatigue Patient left: in chair;with chair alarm set;with call bell/phone within reach Nurse Communication: Mobility status PT Visit Diagnosis: Muscle weakness (generalized) (M62.81);Other abnormalities of gait and mobility (R26.89);Difficulty in walking, not elsewhere classified (R26.2)    Time: 9040-8989 PT Time Calculation (min) (ACUTE ONLY): 11 min   Charges:   PT Evaluation $PT Eval Low Complexity: 1 Low   PT General Charges $$ ACUTE PT VISIT: 1 Visit         Richerd Pinal, PT, DPT 04/07/24, 10:16 AM   Richerd CHRISTELLA Pinal 04/07/2024, 10:15 AM

## 2024-04-07 NOTE — Plan of Care (Signed)
  Problem: Education: Goal: Knowledge of General Education information will improve Description: Including pain rating scale, medication(s)/side effects and non-pharmacologic comfort measures 04/07/2024 0132 by Alphonzo Sari SAILOR, RN Outcome: Progressing 04/07/2024 0131 by Alphonzo Sari SAILOR, RN Outcome: Progressing   Problem: Health Behavior/Discharge Planning: Goal: Ability to manage health-related needs will improve 04/07/2024 0132 by Alphonzo Sari SAILOR, RN Outcome: Progressing 04/07/2024 0131 by Alphonzo Sari SAILOR, RN Outcome: Progressing   Problem: Clinical Measurements: Goal: Ability to maintain clinical measurements within normal limits will improve 04/07/2024 0132 by Alphonzo Sari SAILOR, RN Outcome: Progressing 04/07/2024 0131 by Alphonzo Sari SAILOR, RN Outcome: Progressing Goal: Will remain free from infection 04/07/2024 0132 by Alphonzo Sari SAILOR, RN Outcome: Progressing 04/07/2024 0131 by Alphonzo Sari SAILOR, RN Outcome: Progressing Goal: Diagnostic test results will improve 04/07/2024 0132 by Alphonzo Sari SAILOR, RN Outcome: Progressing 04/07/2024 0131 by Alphonzo Sari SAILOR, RN Outcome: Progressing Goal: Respiratory complications will improve 04/07/2024 0132 by Alphonzo Sari SAILOR, RN Outcome: Progressing 04/07/2024 0131 by Alphonzo Sari SAILOR, RN Outcome: Progressing Goal: Cardiovascular complication will be avoided 04/07/2024 0132 by Alphonzo Sari SAILOR, RN Outcome: Progressing 04/07/2024 0131 by Alphonzo Sari SAILOR, RN Outcome: Progressing   Problem: Activity: Goal: Risk for activity intolerance will decrease 04/07/2024 0132 by Alphonzo Sari SAILOR, RN Outcome: Progressing 04/07/2024 0131 by Alphonzo Sari SAILOR, RN Outcome: Progressing   Problem: Nutrition: Goal: Adequate nutrition will be maintained 04/07/2024 0132 by Alphonzo Sari SAILOR, RN Outcome: Progressing 04/07/2024 0131 by Alphonzo Sari SAILOR, RN Outcome: Progressing   Problem: Coping: Goal: Level of anxiety will decrease 04/07/2024 0132 by Alphonzo Sari SAILOR, RN Outcome:  Progressing 04/07/2024 0131 by Alphonzo Sari SAILOR, RN Outcome: Progressing   Problem: Elimination: Goal: Will not experience complications related to bowel motility 04/07/2024 0132 by Alphonzo Sari SAILOR, RN Outcome: Progressing 04/07/2024 0131 by Alphonzo Sari SAILOR, RN Outcome: Progressing Goal: Will not experience complications related to urinary retention 04/07/2024 0132 by Alphonzo Sari SAILOR, RN Outcome: Progressing 04/07/2024 0131 by Alphonzo Sari SAILOR, RN Outcome: Progressing   Problem: Pain Managment: Goal: General experience of comfort will improve and/or be controlled 04/07/2024 0132 by Alphonzo Sari SAILOR, RN Outcome: Progressing 04/07/2024 0131 by Alphonzo Sari SAILOR, RN Outcome: Progressing   Problem: Safety: Goal: Ability to remain free from injury will improve 04/07/2024 0132 by Alphonzo Sari SAILOR, RN Outcome: Progressing 04/07/2024 0131 by Alphonzo Sari SAILOR, RN Outcome: Progressing   Problem: Skin Integrity: Goal: Risk for impaired skin integrity will decrease 04/07/2024 0132 by Alphonzo Sari SAILOR, RN Outcome: Progressing 04/07/2024 0131 by Alphonzo Sari SAILOR, RN Outcome: Progressing

## 2024-04-08 DIAGNOSIS — B9629 Other Escherichia coli [E. coli] as the cause of diseases classified elsewhere: Secondary | ICD-10-CM | POA: Diagnosis not present

## 2024-04-08 DIAGNOSIS — N390 Urinary tract infection, site not specified: Secondary | ICD-10-CM | POA: Diagnosis not present

## 2024-04-08 DIAGNOSIS — Z1612 Extended spectrum beta lactamase (ESBL) resistance: Secondary | ICD-10-CM | POA: Diagnosis not present

## 2024-04-08 LAB — BASIC METABOLIC PANEL WITH GFR
Anion gap: 6 (ref 5–15)
BUN: 15 mg/dL (ref 8–23)
CO2: 23 mmol/L (ref 22–32)
Calcium: 8.5 mg/dL — ABNORMAL LOW (ref 8.9–10.3)
Chloride: 110 mmol/L (ref 98–111)
Creatinine, Ser: 1.06 mg/dL — ABNORMAL HIGH (ref 0.44–1.00)
GFR, Estimated: 57 mL/min — ABNORMAL LOW (ref >60)
Glucose, Bld: 134 mg/dL — ABNORMAL HIGH (ref 70–99)
Potassium: 3.6 mmol/L (ref 3.5–5.1)
Sodium: 139 mmol/L (ref 135–145)

## 2024-04-08 LAB — CBC
HCT: 27.6 % — ABNORMAL LOW (ref 36.0–46.0)
Hemoglobin: 7.8 g/dL — ABNORMAL LOW (ref 12.0–15.0)
MCH: 24.7 pg — ABNORMAL LOW (ref 26.0–34.0)
MCHC: 28.3 g/dL — ABNORMAL LOW (ref 30.0–36.0)
MCV: 87.3 fL (ref 80.0–100.0)
Platelets: 299 K/uL (ref 150–400)
RBC: 3.16 MIL/uL — ABNORMAL LOW (ref 3.87–5.11)
RDW: 16.5 % — ABNORMAL HIGH (ref 11.5–15.5)
WBC: 4.5 K/uL (ref 4.0–10.5)
nRBC: 0 % (ref 0.0–0.2)

## 2024-04-08 LAB — URINE CULTURE: Culture: 80000 — AB

## 2024-04-08 NOTE — Progress Notes (Signed)
 PROGRESS NOTE    Courtney Mack  FMW:[id number] DOB: 10-Mar-1956 DOA: 04/06/2024 PCP: [facility]  Chief Complaint  Patient presents with   left sided pain    Hospital Course:  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 Patient admitted for UTI with history of ESBL E. coli, hypotension, AKI   Subjective: Patient was examined at the bedside, states she does not feel well.  Continues to have dysuria and left-sided flank pain, but better since presentation Will continue IV antibiotics and follow culture   Objective: Vitals:   04/08/24 0005 04/08/24 0409 04/08/24 0801 04/08/24 1242  BP: (!) 136/51 (!) 145/46 (!) 129/51 (!) 128/48  Pulse: 75 84 75 80  Resp: 20 20 16    Temp: 98.2 F (36.8 C) 98.6 F (37 C) 98.1 F (36.7 C) 98.5 F (36.9 C)  TempSrc:  Oral    SpO2: 97% 97% 99% 97%  Weight:      Height:        Intake/Output Summary (Last 24 hours) at 04/08/2024 1725 Last data filed at 04/08/2024 1414 Gross per 24 hour  Intake 640 ml  Output 1750 ml  Net -1110 ml   Filed Weights   04/06/24 0911  Weight: 90.7 kg    Examination: Constitutional: appears age-appropriate, chronically ill Respiratory: clear to auscultation bilaterally, no wheezing, no crackles Cardiovascular: Regular rate and rhythm, no murmurs / rubs / gallops Abdomen: Morbidly obese abdomen, L CVA ttp+, BS +,  Nephrostomy tube in place Skin: no rashes, lesions, ulcers. No induration Neurologic: Sensation intact. Strength 5/5 in all 4.  Psychiatric: Normal judgment and insight. Alert and oriented x 3. Normal mood  Assessment & Plan:  UTI due to extended-spectrum beta lactamase (ESBL) producing Escherichia coli CT left percutaneous nephrostomy and double-J ureteral stent in place.  No  abscess/fluid collection.  Right renal 6 mm calculus located centrally in the right renal pelvis.  No findings of obstructive uropathy. Ucx ESBL E.coli On IV Meropenem  Discussed with Urology, no indication for nephrostomy tube exchange On Trimethoprim  for ppx at home   Hypotension - resolved S/p IV fluids   AKI  Mild bump in Cr today  S/p IV fluids, encourage po intake Hold lasix    Iron  deficiency anemia Acute drop in Hb Resume home iron  supplementation with breakfast  Hb 9.2 -> 7.8 - stable, s/p IV fluids Monitor Hb  Chronic diastolic CHF - Hold lasix  as above   Dyslipidemia Home rosuvastatin  10   Obesity, Class III, BMI 40-49.9 (morbid obesity)   Chronic obstructive pulmonary disease (COPD) (HCC) Not in acute exacerbation Home inhalers   Generalized weakness Fall precaution PT/OT rec Home PT - patient refused, states will follow up with her PCP  DVT prophylaxis: Lovenox  SQ   Code Status: Full Code Disposition:  Home with home health  Consultants:  None  Procedures:  None  Antimicrobials:  Anti-infectives (From admission, onward)    Start     Dose/Rate Route Frequency Ordered Stop   04/07/24 2200  meropenem  (MERREM ) 1 g in sodium chloride  0.9 % 100 mL IVPB        1 g 200 mL/hr over 30 Minutes Intravenous Every 8 hours 04/07/24 1409     04/07/24 1200  vancomycin  (VANCOREADY) IVPB 750 mg/150 mL  Status:  Discontinued  750 mg 150 mL/hr over 60 Minutes Intravenous Every 24 hours 04/07/24 0755 04/07/24 1404   04/06/24 1245  vancomycin  (VANCOREADY) IVPB 2000 mg/400 mL        2,000 mg 200 mL/hr over 120 Minutes Intravenous  Once 04/06/24 1146 04/06/24 1457   04/06/24 1200  meropenem  (MERREM ) 1 g in sodium chloride  0.9 % 100 mL IVPB  Status:  Discontinued        1 g 200 mL/hr over 30 Minutes Intravenous Every 12 hours 04/06/24 1146 04/07/24 1409   04/06/24 1153  vancomycin  variable dose per unstable renal function (pharmacist dosing)  Status:  Discontinued          Does not apply See admin instructions 04/06/24 1153 04/07/24 1151       Data Reviewed: I have personally reviewed following labs and imaging studies CBC: Recent Labs  Lab 04/06/24 0933 04/07/24 0347 04/08/24 0425  WBC 5.0 4.1 4.5  NEUTROABS 3.5  --   --   HGB 9.2* 7.8* 7.8*  HCT 31.7* 26.4* 27.6*  MCV 87.1 86.8 87.3  PLT 307 274 299   Basic Metabolic Panel: Recent Labs  Lab 04/06/24 0933 04/07/24 0347 04/08/24 0425  NA 139 139 139  K 4.6 3.9 3.6  CL 105 115* 110  CO2 24 21* 23  GLUCOSE 109* 95 134*  BUN 18 16 15   CREATININE 1.25* 0.97 1.06*  CALCIUM  9.1 7.9* 8.5*   GFR: Estimated Creatinine Clearance: 49.2 mL/min (A) (by C-G formula based on SCr of 1.06 mg/dL (H)). Liver Function Tests: Recent Labs  Lab 04/06/24 0933  AST 18  ALT 9  ALKPHOS 50  BILITOT 0.9  PROT 6.8  ALBUMIN 3.3*   CBG: No results for input(s): GLUCAP in the last 168 hours.  Recent Results (from the past 240 hours)  Urine Culture     Status: Abnormal   Collection Time: 04/06/24  9:06 AM   Specimen: Urine, Random  Result Value Ref Range Status   Specimen Description   Final    URINE, RANDOM Performed at [facility], [address] [place], [place] [address]    Special Requests   Final    NONE Reflexed from [id number] Performed at [facility], [address] [place], [place] [address]    Culture (A)  Final    80,000 COLONIES/mL ESCHERICHIA COLI Confirmed Extended Spectrum Beta-Lactamase Producer (ESBL).  In bloodstream infections from ESBL organisms, carbapenems are preferred over piperacillin /tazobactam. They are shown to have a lower risk of mortality.    Report Status 04/08/2024 FINAL  Final   Organism ID, Bacteria ESCHERICHIA COLI (A)  Final      Susceptibility   Escherichia coli - MIC*    AMPICILLIN  >=32 RESISTANT Resistant     CEFAZOLIN  (URINE) Value in next row Resistant      >=32 RESISTANTThis is a modified FDA-approved test that has been  validated and its performance characteristics determined by the reporting laboratory.  This laboratory is certified under the Clinical Laboratory Improvement Amendments CLIA as qualified to perform high complexity clinical laboratory testing.    CEFEPIME  Value in next row Resistant      >=32 RESISTANTThis is a modified FDA-approved test that has been validated and its performance characteristics determined by the reporting laboratory.  This laboratory is certified under the Clinical Laboratory Improvement Amendments CLIA as qualified to perform high complexity clinical laboratory testing.    ERTAPENEM Value in next row Sensitive      >=32 RESISTANTThis is  a modified FDA-approved test that has been validated and its performance characteristics determined by the reporting laboratory.  This laboratory is certified under the Clinical Laboratory Improvement Amendments CLIA as qualified to perform high complexity clinical laboratory testing.    CEFTRIAXONE  Value in next row Resistant      >=32 RESISTANTThis is a modified FDA-approved test that has been validated and its performance characteristics determined by the reporting laboratory.  This laboratory is certified under the Clinical Laboratory Improvement Amendments CLIA as qualified to perform high complexity clinical laboratory testing.    CIPROFLOXACIN Value in next row Resistant      >=32 RESISTANTThis is a modified FDA-approved test that has been validated and its performance characteristics determined by the reporting laboratory.  This laboratory is certified under the Clinical Laboratory Improvement Amendments CLIA as qualified to perform high complexity clinical laboratory testing.    GENTAMICIN  Value in next row Sensitive      >=32 RESISTANTThis is a modified FDA-approved test that has been validated and its performance characteristics determined by the reporting laboratory.  This laboratory is certified under the Clinical Laboratory Improvement  Amendments CLIA as qualified to perform high complexity clinical laboratory testing.    NITROFURANTOIN Value in next row Sensitive      >=32 RESISTANTThis is a modified FDA-approved test that has been validated and its performance characteristics determined by the reporting laboratory.  This laboratory is certified under the Clinical Laboratory Improvement Amendments CLIA as qualified to perform high complexity clinical laboratory testing.    TRIMETH /SULFA  Value in next row Resistant      >=32 RESISTANTThis is a modified FDA-approved test that has been validated and its performance characteristics determined by the reporting laboratory.  This laboratory is certified under the Clinical Laboratory Improvement Amendments CLIA as qualified to perform high complexity clinical laboratory testing.    AMPICILLIN /SULBACTAM Value in next row Resistant      >=32 RESISTANTThis is a modified FDA-approved test that has been validated and its performance characteristics determined by the reporting laboratory.  This laboratory is certified under the Clinical Laboratory Improvement Amendments CLIA as qualified to perform high complexity clinical laboratory testing.    PIP/TAZO Value in next row Sensitive      8 SENSITIVEThis is a modified FDA-approved test that has been validated and its performance characteristics determined by the reporting laboratory.  This laboratory is certified under the Clinical Laboratory Improvement Amendments CLIA as qualified to perform high complexity clinical laboratory testing.    MEROPENEM  Value in next row Sensitive      8 SENSITIVEThis is a modified FDA-approved test that has been validated and its performance characteristics determined by the reporting laboratory.  This laboratory is certified under the Clinical Laboratory Improvement Amendments CLIA as qualified to perform high complexity clinical laboratory testing.    * 80,000 COLONIES/mL ESCHERICHIA COLI     Radiology Studies: DG  Chest 1 View Result Date: 04/07/2024 EXAM: 1 VIEW(S) XRAY OF THE CHEST 04/07/2024 07:24:12 PM COMPARISON: 08/27/2023 CLINICAL HISTORY: Pulmonary edema FINDINGS: LUNGS AND PLEURA: Low lung volumes. No focal pulmonary opacity. No pulmonary edema. No pleural effusion. No pneumothorax. HEART AND MEDIASTINUM: Aortic atherosclerosis. No acute abnormality of the cardiac and mediastinal silhouettes. BONES AND SOFT TISSUES: No acute osseous abnormality. IMPRESSION: 1. No acute abnormalities. Electronically signed by: Norman Gatlin MD 04/07/2024 09:29 PM EDT RP Workstation: [id number]    Scheduled Meds:  vitamin C   1,000 mg Oral Daily   enoxaparin   30 mg Subcutaneous Q12H   ferrous  sulfate  325 mg Oral Q breakfast   fluticasone  furoate-vilanterol  1 puff Inhalation Daily   pantoprazole   40 mg Oral BID   rosuvastatin   10 mg Oral QHS   cyanocobalamin   100 mcg Oral Daily   Continuous Infusions:  meropenem  (MERREM ) IV 1 g (04/08/24 1332)     LOS: 2 days  MDM: Patient is high risk for one or more organ failure.  They necessitate ongoing hospitalization for continued IV therapies and subsequent lab monitoring. Total time spent interpreting labs and vitals, reviewing the medical record, coordinating care amongst consultants and care team members, directly assessing and discussing care with the patient and/or family: 55 min Laree Lock, MD [facility]  To contact the attending physician between 7A-7P please use Epic Chat. To contact the covering physician during after hours 7P-7A, please review Amion.  04/08/2024, 5:25 PM   *This document has been created with the assistance of dictation software. Please excuse typographical errors. *

## 2024-04-08 NOTE — TOC Progression Note (Signed)
 Transition of Care [facility]) - Progression Note    Patient Details  Name: Courtney Mack MRN: [id number] Date of Birth: Oct 10, 1955  [facility]) CM/SW Contact  Alfonso Rummer, LCSW Phone Number: 04/08/2024, 2:31 PM  Clinical Narrative:     Courtney Mack Rummer met with pt in room 231.Social worker introduced herself and purpose for visit. [name] was advise of recommendations for home health pt and ot. [name] has declined these services two separate times. [name] reports if home health needs arise she will consult with her pcp to arrange. No further TOC needs identified.   Expected Discharge Plan: Home/Self Care Barriers to Discharge: Continued Medical Work up               Expected Discharge Plan and Services       Living arrangements for the past 2 months: Single Family Home                                       Social Drivers of Health (SDOH) Interventions SDOH Screenings   Food Insecurity: No Food Insecurity (04/06/2024)  Housing: Low Risk  (04/06/2024)  Transportation Needs: No Transportation Needs (04/06/2024)  Utilities: Not At Risk (04/06/2024)  Depression (PHQ2-9): Low Risk  (04/11/2022)  Social Connections: Unknown (04/06/2024)  Tobacco Use: Low Risk  (04/06/2024)    Readmission Risk Interventions    04/07/2024    3:24 PM 08/07/2023   10:26 AM 07/24/2023   11:14 AM  Readmission Risk Prevention Plan  Transportation Screening Complete  Complete  PCP or Specialist Appt within 3-5 Days Complete    Social Work Consult for Recovery Care Planning/Counseling Complete    Palliative Care Screening Not Applicable    Medication Review Oceanographer) Complete Complete Complete  PCP or Specialist appointment within 3-5 days of discharge  Complete Complete  SW Recovery Care/Counseling Consult  Complete Complete  Palliative Care Screening  Not Applicable Not Applicable  Skilled Nursing Facility  Not Applicable Not Applicable

## 2024-04-08 NOTE — Plan of Care (Signed)

## 2024-04-08 NOTE — Plan of Care (Signed)
  Problem: Education: Goal: Knowledge of General Education information will improve Description: Including pain rating scale, medication(s)/side effects and non-pharmacologic comfort measures Outcome: Progressing   Problem: Health Behavior/Discharge Planning: Goal: Ability to manage health-related needs will improve Outcome: Progressing   Problem: Clinical Measurements: Goal: Ability to maintain clinical measurements within normal limits will improve Outcome: Progressing Goal: Diagnostic test results will improve Outcome: Progressing Goal: Respiratory complications will improve Outcome: Progressing Goal: Cardiovascular complication will be avoided Outcome: Progressing   Problem: Activity: Goal: Risk for activity intolerance will decrease Outcome: Progressing   Problem: Nutrition: Goal: Adequate nutrition will be maintained Outcome: Progressing   Problem: Coping: Goal: Level of anxiety will decrease Outcome: Progressing   Problem: Elimination: Goal: Will not experience complications related to bowel motility Outcome: Progressing Goal: Will not experience complications related to urinary retention Outcome: Progressing   

## 2024-04-08 NOTE — Progress Notes (Signed)
 Dressing changed to left nephrostomy tube. Split gauze and tape.

## 2024-04-08 NOTE — Care Management Important Message (Signed)
 Important Message  Patient Details  Name: Courtney Mack MRN: [id number] Date of Birth: Dec 29, 1955   Important Message Given:  Yes - Medicare IM     [name] 04/08/2024, 4:36 PM

## 2024-04-08 NOTE — Plan of Care (Signed)
  Problem: Education: Goal: Knowledge of General Education information will improve Description: Including pain rating scale, medication(s)/side effects and non-pharmacologic comfort measures Outcome: Progressing   Problem: Clinical Measurements: Goal: Will remain free from infection Outcome: Progressing   Problem: Clinical Measurements: Goal: Respiratory complications will improve Outcome: Progressing   Problem: Clinical Measurements: Goal: Cardiovascular complication will be avoided Outcome: Progressing   Problem: Activity: Goal: Risk for activity intolerance will decrease Outcome: Progressing   Problem: Pain Managment: Goal: General experience of comfort will improve and/or be controlled Outcome: Progressing   Problem: Safety: Goal: Ability to remain free from injury will improve Outcome: Progressing

## 2024-04-08 NOTE — Progress Notes (Signed)
 Physical Therapy Treatment Patient Details Name: Courtney Mack MRN: [id number] DOB: 12-06-1955 Today's Date: 04/08/2024   History of Present Illness Pt is a 68 y/o F admitted on 04/06/24 after presenting with c/o L sided pain & dysuria. Pt is being treated for UTI 2/2 ESBL producing E. Coli. PMH: DVT/PE on lovenox , HTN, GERD, L nephrostomy tube, HLD, acute BLE DVT, acute massive PE, anemia, ARF, CHF, covid-19, DM, endometrial CA, GAVE, obesity    PT Comments  Pt was long sitting in bed upon arrival. She is alert and O and agreeable to session. Does require a little encouragement but eventually agreeable to OOB. Pt reports she sleeps in lift chair at home. Chartered loss adjuster confirmed with daughter post session. Pt was able to progress to standing and ambulating ~ 40 ft with slow cautious gait kinematics. No LOB however pt does endorse Bilateral knee pain and hip pain. Acute PT recommends HHPT however pt currently refusing. Acute PT will continue to follow per current POC and follow until Dc'd home.     If plan is discharge home, recommend the following: A little help with walking and/or transfers;A little help with bathing/dressing/bathroom;Assistance with cooking/housework;Assist for transportation;Help with stairs or ramp for entrance     Equipment Recommendations  None recommended by PT       Precautions / Restrictions Precautions Precautions: Fall Precaution/Restrictions Comments: L nephrostomy tube Restrictions Weight Bearing Restrictions Per Provider Order: No     Mobility  Bed Mobility Overal bed mobility: Needs Assistance Bed Mobility: Supine to Sit  Supine to sit: Min assist  General bed mobility comments: pt sleeps in lift chair at baseline    Transfers Overall transfer level: Needs assistance Equipment used: Rolling walker (2 wheels) Transfers: Sit to/from Stand Sit to Stand: Contact guard assist   Ambulation/Gait Ambulation/Gait assistance: Contact guard assist Gait  Distance (Feet): 40 Feet Assistive device: Rolling walker (2 wheels) Gait Pattern/deviations: Step-through pattern Gait velocity: decreased  General Gait Details: Slow cautious gait however no LOB    Balance Overall balance assessment: Needs assistance Sitting-balance support: No upper extremity supported, Feet supported Sitting balance-Leahy Scale: Good     Standing balance support: Bilateral upper extremity supported, During functional activity Standing balance-Leahy Scale: Fair Standing balance comment: reliant on BUE support      Communication Communication Communication: No apparent difficulties  Cognition Arousal: Alert Behavior During Therapy: Flat affect, WFL for tasks assessed/performed   PT - Cognitive impairments: No apparent impairments    Following commands: Intact      Cueing Cueing Techniques: Verbal cues         Pertinent Vitals/Pain Pain Assessment Pain Assessment: 0-10 Pain Score: 4  Pain Location: bilat knees and hip Pain Descriptors / Indicators: Discomfort Pain Intervention(s): Limited activity within patient's tolerance, Monitored during session     PT Goals (current goals can now be found in the care plan section) Acute Rehab PT Goals Patient Stated Goal: get better Progress towards PT goals: Progressing toward goals    Frequency    Min 2X/week       AM-PAC PT 6 Clicks Mobility   Outcome Measure  Help needed turning from your back to your side while in a flat bed without using bedrails?: A Little Help needed moving from lying on your back to sitting on the side of a flat bed without using bedrails?: A Little Help needed moving to and from a bed to a chair (including a wheelchair)?: A Little Help needed standing up from a chair using  your arms (e.g., wheelchair or bedside chair)?: A Little Help needed to walk in hospital room?: A Little Help needed climbing 3-5 steps with a railing? : A Lot 6 Click Score: 17    End of Session    Activity Tolerance: Patient tolerated treatment well Patient left: in chair;with call bell/phone within reach;with chair alarm set Nurse Communication: Mobility status PT Visit Diagnosis: Muscle weakness (generalized) (M62.81);Other abnormalities of gait and mobility (R26.89);Difficulty in walking, not elsewhere classified (R26.2)     Time: 1450-1505 PT Time Calculation (min) (ACUTE ONLY): 15 min  Charges:    $Gait Training: 8-22 mins PT General Charges $$ ACUTE PT VISIT: 1 Visit                    Rankin Essex PTA 04/08/24, 3:37 PM

## 2024-04-09 DIAGNOSIS — N390 Urinary tract infection, site not specified: Secondary | ICD-10-CM | POA: Diagnosis not present

## 2024-04-09 DIAGNOSIS — N179 Acute kidney failure, unspecified: Secondary | ICD-10-CM

## 2024-04-09 DIAGNOSIS — Z936 Other artificial openings of urinary tract status: Secondary | ICD-10-CM

## 2024-04-09 DIAGNOSIS — B9620 Unspecified Escherichia coli [E. coli] as the cause of diseases classified elsewhere: Secondary | ICD-10-CM

## 2024-04-09 DIAGNOSIS — B9629 Other Escherichia coli [E. coli] as the cause of diseases classified elsewhere: Secondary | ICD-10-CM | POA: Diagnosis not present

## 2024-04-09 DIAGNOSIS — Z1612 Extended spectrum beta lactamase (ESBL) resistance: Secondary | ICD-10-CM | POA: Diagnosis not present

## 2024-04-09 LAB — BASIC METABOLIC PANEL WITH GFR
Anion gap: 9 (ref 5–15)
BUN: 13 mg/dL (ref 8–23)
CO2: 25 mmol/L (ref 22–32)
Calcium: 8.6 mg/dL — ABNORMAL LOW (ref 8.9–10.3)
Chloride: 106 mmol/L (ref 98–111)
Creatinine, Ser: 1.23 mg/dL — ABNORMAL HIGH (ref 0.44–1.00)
GFR, Estimated: 48 mL/min — ABNORMAL LOW (ref >60)
Glucose, Bld: 117 mg/dL — ABNORMAL HIGH (ref 70–99)
Potassium: 3.3 mmol/L — ABNORMAL LOW (ref 3.5–5.1)
Sodium: 140 mmol/L (ref 135–145)

## 2024-04-09 LAB — MAGNESIUM: Magnesium: 1.9 mg/dL (ref 1.7–2.4)

## 2024-04-09 MED ORDER — SENNOSIDES-DOCUSATE SODIUM 8.6-50 MG PO TABS
1.0000 | ORAL_TABLET | Freq: Two times a day (BID) | ORAL | Status: DC
  Administered 2024-04-09 – 2024-04-10 (×3): 1 via ORAL
  Filled 2024-04-09 (×5): qty 1

## 2024-04-09 MED ORDER — SODIUM CHLORIDE 0.9 % IV SOLN
1.0000 g | Freq: Two times a day (BID) | INTRAVENOUS | Status: DC
  Administered 2024-04-09: 1 g via INTRAVENOUS
  Filled 2024-04-09 (×2): qty 20

## 2024-04-09 MED ORDER — LACTATED RINGERS IV SOLN: INTRAVENOUS | Status: AC

## 2024-04-09 MED ORDER — POLYETHYLENE GLYCOL 3350 17 G PO PACK
17.0000 g | PACK | Freq: Every day | ORAL | Status: DC
  Administered 2024-04-10: 17 g via ORAL
  Filled 2024-04-09 (×2): qty 1

## 2024-04-09 MED ORDER — POTASSIUM CHLORIDE CRYS ER 20 MEQ PO TBCR
40.0000 meq | EXTENDED_RELEASE_TABLET | Freq: Once | ORAL | Status: AC
Start: 2024-04-09 — End: 2024-04-09
  Administered 2024-04-09: 40 meq via ORAL
  Filled 2024-04-09: qty 2

## 2024-04-09 NOTE — Progress Notes (Signed)
 Occupational Therapy Treatment Patient Details Name: Courtney Mack MRN: [id number] DOB: 05/23/1956 Today's Date: 04/09/2024   History of present illness Pt is a 68 y/o F admitted on 04/06/24 after presenting with c/o L sided pain & dysuria. Pt is being treated for UTI 2/2 ESBL producing E. Coli. PMH: DVT/PE on lovenox , HTN, GERD, L nephrostomy tube, HLD, acute BLE DVT, acute massive PE, anemia, ARF, CHF, covid-19, DM, endometrial CA, GAVE, obesity   OT comments  Upon entering the room, pt seated in recliner chair and agreeable to OT intervention. Pt stands from recliner chair with min guard and use of RW. Pt ambulates to sink ~ 15' with use of RW and CGA progressing to close supervision. Pt stands at sink to brush teeth, wash hands, and wash face with supervision. RN arrives to give medications and pt returns to sit in recliner chair but ambulating back to chair in same manner as above. Chair alarm activated for safety. Purewick removed to encourage mobility to bathroom with staff. Call bell and all needed items within reach.       If plan is discharge home, recommend the following:  A little help with walking and/or transfers;A little help with bathing/dressing/bathroom;Direct supervision/assist for medications management   Equipment Recommendations  None recommended by OT       Precautions / Restrictions Precautions Precautions: Fall Precaution/Restrictions Comments: L nephrostomy tube       Mobility Bed Mobility               General bed mobility comments: seated in recliner chair at beginning and end of session    Transfers Overall transfer level: Needs assistance Equipment used: Rolling walker (2 wheels) Transfers: Sit to/from Stand Sit to Stand: Contact guard assist           General transfer comment: sit>stand from recliner     Balance Overall balance assessment: Needs assistance Sitting-balance support: No upper extremity supported, Feet  supported Sitting balance-Leahy Scale: Good     Standing balance support: Bilateral upper extremity supported, During functional activity Standing balance-Leahy Scale: Fair Standing balance comment: reliant on BUE support                           ADL either performed or assessed with clinical judgement   ADL Overall ADL's : Needs assistance/impaired     Grooming: Wash/dry face;Wash/dry hands;Oral care;Standing;Supervision/safety                                      Extremity/Trunk Assessment Upper Extremity Assessment Upper Extremity Assessment: Overall WFL for tasks assessed            Vision Patient Visual Report: No change from baseline           Communication Communication Communication: No apparent difficulties   Cognition Arousal: Alert Behavior During Therapy: WFL for tasks assessed/performed Cognition: No apparent impairments                               Following commands: Intact        Cueing   Cueing Techniques: Verbal cues             Pertinent Vitals/ Pain       Pain Assessment Pain Assessment: No/denies pain         Frequency  Min 2X/week        Progress Toward Goals  OT Goals(current goals can now be found in the care plan section)  Progress towards OT goals: Progressing toward goals      AM-PAC OT 6 Clicks Daily Activity     Outcome Measure   Help from another person eating meals?: None Help from another person taking care of personal grooming?: None Help from another person toileting, which includes using toliet, bedpan, or urinal?: A Little Help from another person bathing (including washing, rinsing, drying)?: A Little Help from another person to put on and taking off regular upper body clothing?: A Little Help from another person to put on and taking off regular lower body clothing?: A Little 6 Click Score: 20    End of Session Equipment Utilized During Treatment: Rolling  walker (2 wheels)  OT Visit Diagnosis: Other abnormalities of gait and mobility (R26.89)   Activity Tolerance Patient tolerated treatment well   Patient Left in chair;with call bell/phone within reach;with chair alarm set;with nursing/sitter in room   Nurse Communication Mobility status        Time: 8878-8862 OT Time Calculation (min): 16 min  Charges: OT General Charges $OT Visit: 1 Visit OT Treatments $Self Care/Home Management : 8-22 mins  Izetta Claude, MS, OTR/L , CBIS ascom [phone]  04/09/24, 12:52 PM

## 2024-04-09 NOTE — Progress Notes (Signed)
 Physical Therapy Treatment Patient Details Name: Courtney Mack MRN: [id number] DOB: Jan 18, 1956 Today's Date: 04/09/2024   History of Present Illness Pt is a 68 y/o F admitted on 04/06/24 after presenting with c/o L sided pain & dysuria. Pt is being treated for UTI 2/2 ESBL producing E. Coli. PMH: DVT/PE on lovenox , HTN, GERD, L nephrostomy tube, HLD, acute BLE DVT, acute massive PE, anemia, ARF, CHF, covid-19, DM, endometrial CA, GAVE, obesity    PT Comments  Patient seen for PT session focused on functional mobility. Patient required ModA for bed mobility and used RW to stand from bed requiring heavy verbal and tactile cues for positioning. Pt also ambulated 100 feet with RW outside of room with minA. Pt demonstrated significant forward lean and weight shift during ambulation. Verbal cues given to improved alignment during gait. Tolerated session well although demonstrating moderate amount of fatigue.Main limiting factors today were poor functional activity tolerance. Interventions aimed at improving activity tolerance. Continued skilled PT recommended to progress toward functional goals and support discharge readiness.    If plan is discharge home, recommend the following: A little help with walking and/or transfers;A little help with bathing/dressing/bathroom;Assistance with cooking/housework;Assist for transportation;Help with stairs or ramp for entrance   Can travel by private vehicle        Equipment Recommendations  None recommended by PT    Recommendations for Other Services       Precautions / Restrictions Precautions Precautions: Fall Precaution/Restrictions Comments: L nephrostomy tube Restrictions Weight Bearing Restrictions Per Provider Order: No     Mobility  Bed Mobility Overal bed mobility: Needs Assistance Bed Mobility: Supine to Sit     Supine to sit: Mod assist     General bed mobility comments: decreased movement speed; vc and physical assistance to exit  and enter bed; decline head of bed for positioning    Transfers Overall transfer level: Needs assistance Equipment used: Rolling walker (2 wheels) Transfers: Sit to/from Stand Sit to Stand: Min assist           General transfer comment: sit>stand from bed    Ambulation/Gait Ambulation/Gait assistance: Contact guard assist Gait Distance (Feet): 100 Feet Assistive device: Rolling walker (2 wheels) Gait Pattern/deviations: Step-through pattern, Narrow base of support Gait velocity: decreased     General Gait Details: Slow cautious gait however no LOB; significant trunk flexion decrease stride length; OOB after returning to bed after ambulatio   Stairs             Wheelchair Mobility     Tilt Bed    Modified Rankin (Stroke Patients Only)       Balance Overall balance assessment: Needs assistance Sitting-balance support: No upper extremity supported, Feet supported Sitting balance-Leahy Scale: Good     Standing balance support: Bilateral upper extremity supported, During functional activity Standing balance-Leahy Scale: Fair Standing balance comment: reliant on BUE support                            Communication Communication Communication: No apparent difficulties  Cognition Arousal: Alert Behavior During Therapy: WFL for tasks assessed/performed   PT - Cognitive impairments: No apparent impairments                       PT - Cognition Comments: pleasant Following commands: Intact      Cueing Cueing Techniques: Verbal cues  Exercises      General Comments  Pertinent Vitals/Pain Pain Assessment Pain Assessment: No/denies pain    Home Living                          Prior Function            PT Goals (current goals can now be found in the care plan section) Acute Rehab PT Goals Patient Stated Goal: get better    Frequency    Min 2X/week      PT Plan      Co-evaluation               AM-PAC PT 6 Clicks Mobility   Outcome Measure  Help needed turning from your back to your side while in a flat bed without using bedrails?: A Little Help needed moving from lying on your back to sitting on the side of a flat bed without using bedrails?: A Little Help needed moving to and from a bed to a chair (including a wheelchair)?: A Little Help needed standing up from a chair using your arms (e.g., wheelchair or bedside chair)?: A Little Help needed to walk in hospital room?: A Little Help needed climbing 3-5 steps with a railing? : A Lot 6 Click Score: 17    End of Session Equipment Utilized During Treatment: Gait belt Activity Tolerance: Patient limited by fatigue Patient left: in chair;with call bell/phone within reach;with chair alarm set Nurse Communication: Mobility status PT Visit Diagnosis: Muscle weakness (generalized) (M62.81);Other abnormalities of gait and mobility (R26.89);Difficulty in walking, not elsewhere classified (R26.2)     Time: 8561-8547 PT Time Calculation (min) (ACUTE ONLY): 14 min  Charges:    $Therapeutic Activity: 8-22 mins PT General Charges $$ ACUTE PT VISIT: 1 Visit                     Sherlean Lesches DPT, PT     Sherlean A Glenna Brunkow 04/09/2024, 2:59 PM

## 2024-04-09 NOTE — Consult Note (Addendum)
 NAME: Courtney Mack  DOB: Oct 09, 1955  MRN: [id number]  Date/Time: 04/09/2024 12:04 PM  REQUESTING PROVIDER: Dr.ponnala Subjective:  REASON FOR CONSULT: ESBl ecoli UTI ? Courtney Mack is a 68 y.o. 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 She says she has had 9 infections in the past 2 years She had even requested the urologist to remove the left kidney Vitals in the ED  Temperature 99.6, BP 102/46, pulse 82, sats 100% and respiratory rate of 17. WBC was 5, Hb 9.2, platelet 307 and creatinine of 1.26. No blood cultures were sent UA showed More than 50 WBCs. Urine culture Was sent and patient was started on meropenem  as she had history of ESBL UTI. CT abdomen pelvis showed moderate inflammatory stranding throughout the left renal pelvis surrounding the left ureter and surrounding the decompressed urinary bladder where the distal pigtail terminated.  No gas or fluid was tracking along the percutaneous catheter.  There was no abscess or fluid collection and no overt no overt left pyelonephritis But severe inflammation of the left renal collecting system, renal hilum, left ureter and bladder. Urine culture came back as ESBL E. coli and I am asked to see the patient for management of the infection Patient states she is feeling a little better today     Past Medical History:  Diagnosis Date   Acute bilateral deep vein thrombosis (DVT) of femoral veins (HCC) 01/02/2021   Acute massive pulmonary embolism (HCC) 08/25/2020   Last Assessment & Plan:  Formatting of this note might be different from the original. Post vena caval filter and on xarelto  and O2   Anemia    ARF  (acute respiratory failure) (HCC)    Cellulitis of left lower leg 06/20/2021   CHF (congestive heart failure) (HCC)    COVID-19    Diabetes mellitus without complication (HCC)    DVT (deep venous thrombosis) (HCC) 09/06/2020   Endometrial cancer (HCC)    GAVE (gastric antral vascular ectasia)    GERD (gastroesophageal reflux disease)    High cholesterol    Hx of blood clots    Hyperlipidemia    Hypertension    IDA (iron  deficiency anemia) 09/21/2020   Obesity    Pneumonia 06/11/2022   recovered   Pressure injury of skin 06/21/2021   Pulmonary embolism (HCC)    Sepsis (HCC)    Thrombocytopenia    Urinary tract infection 09/13/2021    Past Surgical History:  Procedure Laterality Date   CATARACT EXTRACTION W/PHACO Right 06/04/2022   Procedure: CATARACT EXTRACTION PHACO AND INTRAOCULAR LENS PLACEMENT (IOC) COMPLICATED RIGHT  31.83  02:17.4;  Surgeon: Jaye Fallow, MD;  Location: [facility];  Service: Ophthalmology;  Laterality: Right;   CATARACT EXTRACTION W/PHACO Left 07/09/2022   Procedure: CATARACT EXTRACTION PHACO AND INTRAOCULAR LENS PLACEMENT (IOC);  Surgeon: Jaye Fallow, MD;  Location: [facility];  Service: Ophthalmology;  Laterality: Left;  22.75 1:40.3   COLONOSCOPY N/A 06/21/2021   Procedure: COLONOSCOPY;  Surgeon: Toledo, Ladell POUR, MD;  Location: [facility];  Service: Gastroenterology;  Laterality: N/A;   CYSTOSCOPY W/ URETERAL STENT PLACEMENT Left 09/11/2021   Procedure: CYSTOSCOPY WITH RETROGRADE PYELOGRAM/URETERAL STENT PLACEMENT;  Surgeon: Twylla Glendia BROCKS, MD;  Location: [facility]  ORS;  Service: Urology;  Laterality: Left;   CYSTOSCOPY W/ URETERAL STENT PLACEMENT Left 11/17/2021   Procedure: CYSTOSCOPY WITH STENT REPLACEMENT;  Surgeon: Watt Rush, MD;  Location: [facility] ORS;  Service: Urology;  Laterality: Left;   CYSTOSCOPY WITH FULGERATION  11/17/2021   Procedure: CYSTOSCOPY WITH FULGERATION;  Surgeon: Watt Rush, MD;  Location: [facility] OR[facility];   Service: Urology;;   CYSTOSCOPY WITH STENT PLACEMENT Left 10/17/2021   Procedure: CYSTOSCOPY WITH STENT PLACEMENT;  Surgeon: Twylla Glendia BROCKS, MD;  Location: [facility] OR[facility];  Service: Urology;  Laterality: Left;   CYSTOSCOPY WITH URETEROSCOPY, STONE BASKETRY AND STENT PLACEMENT Left 10/16/2021   Procedure: CYSTOSCOPY WITH URETEROSCOPY  AND STENT REMOVAL;  Surgeon: Twylla Glendia BROCKS, MD;  Location: [facility];  Service: Urology;  Laterality: Left;   ENTEROSCOPY N/A 08/17/2021   Procedure: ENTEROSCOPY;  Surgeon: Unk Corinn Skiff, MD;  Location: [facility] END[facility]COPY;  Service: Gastroenterology;  Laterality: N/A;   ESOPHAGOGASTRODUODENOSCOPY N/A 06/21/2021   Procedure: ESOPHAGOGASTRODUODENOSCOPY (EGD);  Surgeon: Toledo, Ladell POUR, MD;  Location: [facility];  Service: Gastroenterology;  Laterality: N/A;   ESOPHAGOGASTRODUODENOSCOPY (EGD) WITH PROPOFOL  N/A 08/17/2021   Procedure: ESOPHAGOGASTRODUODENOSCOPY (EGD) WITH PROPOFOL ;  Surgeon: Unk Corinn Skiff, MD;  Location: [facility] ENDOSCOPY;  Service: Gastroenterology;  Laterality: N/A;   ESOPHAGOGASTRODUODENOSCOPY (EGD) WITH PROPOFOL  N/A 10/14/2021   Procedure: ESOPHAGOGASTRODUODENOSCOPY (EGD) WITH PROPOFOL ;  Surgeon: Jinny Carmine, MD;  Location: [facility];  Service: Endoscopy;  Laterality: N/A;   ESOPHAGOGASTRODUODENOSCOPY (EGD) WITH PROPOFOL  N/A 02/01/2022   Procedure: ESOPHAGOGASTRODUODENOSCOPY (EGD) WITH PROPOFOL ;  Surgeon: Unk Corinn Skiff, MD;  Location: [facility];  Service: Gastroenterology;  Laterality: N/A;   ESOPHAGOGASTRODUODENOSCOPY (EGD) WITH PROPOFOL  N/A 03/15/2022   Procedure: ESOPHAGOGASTRODUODENOSCOPY (EGD) WITH PROPOFOL ;  Surgeon: Therisa Bi, MD;  Location: [facility];  Service: Gastroenterology;  Laterality: N/A;   ESOPHAGOGASTRODUODENOSCOPY (EGD) WITH PROPOFOL  N/A 04/18/2022   Procedure: ESOPHAGOGASTRODUODENOSCOPY (EGD) WITH PROPOFOL ;  Surgeon: Unk Corinn Skiff, MD;  Location: [facility];  Service: Gastroenterology;  Laterality:  N/A;   ESOPHAGOGASTRODUODENOSCOPY (EGD) WITH PROPOFOL  N/A 08/09/2022   Procedure: ESOPHAGOGASTRODUODENOSCOPY (EGD) WITH PROPOFOL ;  Surgeon: Therisa Bi, MD;  Location: [facility] ENDOSCOPY;  Service: Gastroenterology;  Laterality: N/A;   GIVENS CAPSULE STUDY N/A 06/22/2021   Procedure: GIVENS CAPSULE STUDY;  Surgeon: Toledo, Ladell POUR, MD;  Location: [facility];  Service: Gastroenterology;  Laterality: N/A;   IR CONVERT LEFT NEPHROSTOMY TO NEPHROURETERAL CATH  08/29/2023   IR EXT NEPHROURETERAL CATH EXCHANGE  03/05/2024   IR NEPHROSTOMY EXCHANGE LEFT  02/25/2022   IR NEPHROSTOMY EXCHANGE LEFT  03/27/2022   IR NEPHROSTOMY EXCHANGE LEFT  05/22/2022   IR NEPHROSTOMY EXCHANGE LEFT  07/24/2022   IR NEPHROSTOMY EXCHANGE LEFT  09/24/2022   IR NEPHROSTOMY EXCHANGE LEFT  11/21/2022   IR NEPHROSTOMY EXCHANGE LEFT  02/17/2023   IR NEPHROSTOMY EXCHANGE LEFT  04/06/2023   IR NEPHROSTOMY EXCHANGE LEFT  05/27/2023   IR NEPHROSTOMY EXCHANGE LEFT  07/28/2023   IR NEPHROSTOMY PLACEMENT LEFT  12/27/2021   IVC FILTER INSERTION N/A 08/18/2020   Procedure: IVC FILTER INSERTION;  Surgeon: Jama Cordella MATSU, MD;  Location: [facility];  Service: Cardiovascular;  Laterality: N/A;   PERIPHERAL VASCULAR THROMBECTOMY Bilateral 01/03/2021   Procedure: PERIPHERAL VASCULAR THROMBECTOMY;  Surgeon: Marea Selinda RAMAN, MD;  Location: [facility];  Service: Cardiovascular;  Laterality: Bilateral;   PULMONARY THROMBECTOMY N/A 08/18/2020   Procedure: PULMONARY THROMBECTOMY;  Surgeon: Jama Cordella MATSU, MD;  Location: [facility];  Service: Cardiovascular;  Laterality: N/A;   TUBAL LIGATION  VISCERAL ANGIOGRAPHY N/A 07/18/2021   Procedure: VISCERAL ANGIOGRAPHY;  Surgeon: Marea Selinda RAMAN, MD;  Location: [facility];  Service: Cardiovascular;  Laterality: N/A;    Social History   Socioeconomic History   Marital status: Single    Spouse name: Not on file   Number of children: Not on file   Years of education:  Not on file   Highest education level: Not on file  Occupational History   Not on file  Tobacco Use   Smoking status: Never   Smokeless tobacco: Never   Tobacco comments:    smoked 2-4 cigarettes for about 2-3 weeks   Vaping Use   Vaping status: Never Used  Substance and Sexual Activity   Alcohol  use: Not Currently   Drug use: Never   Sexual activity: Not Currently  Other Topics Concern   Not on file  Social History Narrative   Not on file   Social Drivers of Health   Financial Resource Strain: Not on file  Food Insecurity: No Food Insecurity (04/06/2024)   Hunger Vital Sign    Worried About Running Out of Food in the Last Year: Never true    Ran Out of Food in the Last Year: Never true  Transportation Needs: No Transportation Needs (04/06/2024)   PRAPARE - Administrator, Civil Service (Medical): No    Lack of Transportation (Non-Medical): No  Physical Activity: Not on file  Stress: Not on file  Social Connections: Unknown (04/06/2024)   Social Connection and Isolation Panel    Frequency of Communication with Friends and Family: More than three times a week    Frequency of Social Gatherings with Friends and Family: More than three times a week    Attends Religious Services: 1 to 4 times per year    Active Member of [employer] or Organizations: No    Attends Banker Meetings: Never    Marital Status: Patient declined  Catering manager Violence: Not At Risk (04/06/2024)   Humiliation, Afraid, Rape, and Kick questionnaire    Fear of Current or Ex-Partner: No    Emotionally Abused: No    Physically Abused: No    Sexually Abused: No    Family History  Problem Relation Age of Onset   Hypertension Mother    Diabetes Mother    Diabetes Father    Hypertension Father    High Cholesterol Father    Congestive Heart Failure Father    Breast cancer Cousin    No Known Allergies I? Current Facility-Administered Medications  Medication Dose Route Frequency  Provider Last Rate Last Admin   acetaminophen  (TYLENOL ) tablet 650 mg  650 mg Oral Q6H PRN Cox, Amy N, DO   650 mg at 04/08/24 2303   Or   acetaminophen  (TYLENOL ) suppository 650 mg  650 mg Rectal Q6H PRN Cox, Amy N, DO       albuterol  (PROVENTIL ) (2.5 MG/3ML) 0.083% nebulizer solution 3 mL  3 mL Inhalation Q6H PRN Cox, Amy N, DO       ascorbic acid  (VITAMIN C ) tablet 1,000 mg  1,000 mg Oral Daily Cox, Amy N, DO   1,000 mg at 04/09/24 0859   enoxaparin  (LOVENOX ) injection 30 mg  30 mg Subcutaneous Q12H Cox, Amy N, DO   30 mg at 04/09/24 0859   ferrous sulfate  tablet 325 mg  325 mg Oral Q breakfast Cox, Amy N, DO   325 mg at 04/09/24 0859   fluticasone  furoate-vilanterol (BREO ELLIPTA ) 100-25  MCG/ACT 1 puff  1 puff Inhalation Daily Cox, Amy N, DO   1 puff at 04/09/24 0906   lactated ringers  infusion   Intravenous Continuous Ponnala, Shruthi, MD 50 mL/hr at 04/09/24 0903 New Bag at 04/09/24 9096   meropenem  (MERREM ) 1 g in sodium chloride  0.9 % 100 mL IVPB  1 g Intravenous Q12H Nada Adriana BIRCH, RPH       ondansetron  (ZOFRAN ) tablet 4 mg  4 mg Oral Q6H PRN Cox, Amy N, DO       Or   ondansetron  (ZOFRAN ) injection 4 mg  4 mg Intravenous Q6H PRN Cox, Amy N, DO       pantoprazole  (PROTONIX ) EC tablet 40 mg  40 mg Oral BID Cox, Amy N, DO   40 mg at 04/09/24 0859   polyethylene glycol (MIRALAX  / GLYCOLAX ) packet 17 g  17 g Oral Daily Ponnala, Shruthi, MD       rosuvastatin  (CRESTOR ) tablet 10 mg  10 mg Oral QHS Cox, Amy N, DO   10 mg at 04/08/24 2207   senna-docusate (Senokot-S) tablet 1 tablet  1 tablet Oral BID Ponnala, Shruthi, MD   1 tablet at 04/09/24 1136   vitamin B-12 (CYANOCOBALAMIN ) tablet 100 mcg  100 mcg Oral Daily Cox, Amy N, DO   100 mcg at 04/09/24 9140     Abtx:  Anti-infectives (From admission, onward)    Start     Dose/Rate Route Frequency Ordered Stop   04/09/24 2200  meropenem  (MERREM ) 1 g in sodium chloride  0.9 % 100 mL IVPB        1 g 200 mL/hr over 30 Minutes Intravenous  Every 12 hours 04/09/24 0829     04/07/24 2200  meropenem  (MERREM ) 1 g in sodium chloride  0.9 % 100 mL IVPB  Status:  Discontinued        1 g 200 mL/hr over 30 Minutes Intravenous Every 8 hours 04/07/24 1409 04/09/24 0829   04/07/24 1200  vancomycin  (VANCOREADY) IVPB 750 mg/150 mL  Status:  Discontinued        750 mg 150 mL/hr over 60 Minutes Intravenous Every 24 hours 04/07/24 0755 04/07/24 1404   04/06/24 1245  vancomycin  (VANCOREADY) IVPB 2000 mg/400 mL        2,000 mg 200 mL/hr over 120 Minutes Intravenous  Once 04/06/24 1146 04/06/24 1457   04/06/24 1200  meropenem  (MERREM ) 1 g in sodium chloride  0.9 % 100 mL IVPB  Status:  Discontinued        1 g 200 mL/hr over 30 Minutes Intravenous Every 12 hours 04/06/24 1146 04/07/24 1409   04/06/24 1153  vancomycin  variable dose per unstable renal function (pharmacist dosing)  Status:  Discontinued         Does not apply See admin instructions 04/06/24 1153 04/07/24 1151       REVIEW OF SYSTEMS:  Const: Subjective fever, negative chills, negative weight loss Eyes: negative diplopia or visual changes, negative eye pain ENT: negative coryza, negative sore throat Resp: negative cough, hemoptysis, dyspnea Cards: negative for chest pain, palpitations, lower extremity edema GU: See dysuria and left-sided abdominal flank pain GI: Negative for abdominal pain, diarrhea, bleeding, constipation Skin: negative for rash and pruritus Heme: negative for easy bruising and gum/nose bleeding MS: Weakness Neurolo:negative for headaches, dizziness, vertigo, memory problems  Psych: negative for feelings of anxiety, depression  Endocrine: negative for thyroid, diabetes Allergy/Immunology- negative for any medication or food allergies ?  Objective:  VITALS:  BP (!) 118/49 (BP  Location: Left Arm)   Pulse 84   Temp 97.6 F (36.4 C)   Resp 17   Ht 4' 11 (1.499 m)   Wt 90.7 kg   SpO2 99%   BMI 40.40 kg/m   PHYSICAL EXAM:  General: Alert,  cooperative, no distress, appears stated age.  Increased BMI Head: Normocephalic, without obvious abnormality, atraumatic. Eyes: Conjunctivae clear, anicteric sclerae. Pupils are equal ENT Nares normal. No drainage or sinus tenderness. Lips, mucosa, and tongue normal. No Thrush Neck: Supple, symmetrical, no adenopathy, thyroid: non tender no carotid bruit and no JVD. Back: Left nephrostomy in place Some tenderness around Lungs: Clear to auscultation bilaterally. No Wheezing or Rhonchi. No rales. Heart: Regular rate and rhythm, no murmur, rub or gallop. Abdomen: Soft, non-tender,not distended. Bowel sounds normal. No masses Extremities: atraumatic, no cyanosis. No edema. No clubbing Skin: No rashes or lesions. Or bruising Lymph: Cervical, supraclavicular normal. Neurologic: Grossly non-focal Pertinent Labs Lab Results CBC    Component Value Date/Time   WBC 4.5 04/08/2024 0425   RBC 3.16 (L) 04/08/2024 0425   HGB 7.8 (L) 04/08/2024 0425   HGB 9.8 (L) 03/24/2023 1351   HCT 27.6 (L) 04/08/2024 0425   PLT 299 04/08/2024 0425   PLT 256 03/24/2023 1351   MCV 87.3 04/08/2024 0425   MCH 24.7 (L) 04/08/2024 0425   MCHC 28.3 (L) 04/08/2024 0425   RDW 16.5 (H) 04/08/2024 0425   LYMPHSABS 1.0 04/06/2024 0933   MONOABS 0.3 04/06/2024 0933   EOSABS 0.1 04/06/2024 0933   BASOSABS 0.0 04/06/2024 0933       Latest Ref Rng & Units 04/09/2024    3:18 AM 04/08/2024    4:25 AM 04/07/2024    3:47 AM  CMP  Glucose 70 - 99 mg/dL 882  865  95   BUN 8 - 23 mg/dL 13  15  16    Creatinine 0.44 - 1.00 mg/dL 8.76  8.93  9.02   Sodium 135 - 145 mmol/L 140  139  139   Potassium 3.5 - 5.1 mmol/L 3.3  3.6  3.9   Chloride 98 - 111 mmol/L 106  110  115   CO2 22 - 32 mmol/L 25  23  21    Calcium  8.9 - 10.3 mg/dL 8.6  8.5  7.9       Microbiology: Recent Results (from the past 240 hours)  Urine Culture     Status: Abnormal   Collection Time: 04/06/24  9:06 AM   Specimen: Urine, Random  Result Value  Ref Range Status   Specimen Description   Final    URINE, RANDOM Performed at [facility], [address], [place], [place] [address]    Special Requests   Final    NONE Reflexed from [id number] Performed at [facility], [address] [place], [place] [address]    Culture (A)  Final    80,000 COLONIES/mL ESCHERICHIA COLI Confirmed Extended Spectrum Beta-Lactamase Producer (ESBL).  In bloodstream infections from ESBL organisms, carbapenems are preferred over piperacillin /tazobactam. They are shown to have a lower risk of mortality.    Report Status 04/08/2024 FINAL  Final   Organism ID, Bacteria ESCHERICHIA COLI (A)  Final      Susceptibility   Escherichia coli - MIC*    AMPICILLIN  >=32 RESISTANT Resistant     CEFAZOLIN  (URINE) Value in next row Resistant      >=32 RESISTANTThis is a modified FDA-approved test that has been validated and its performance characteristics determined by  the reporting laboratory.  This laboratory is certified under the Clinical Laboratory Improvement Amendments CLIA as qualified to perform high complexity clinical laboratory testing.    CEFEPIME  Value in next row Resistant      >=32 RESISTANTThis is a modified FDA-approved test that has been validated and its performance characteristics determined by the reporting laboratory.  This laboratory is certified under the Clinical Laboratory Improvement Amendments CLIA as qualified to perform high complexity clinical laboratory testing.    ERTAPENEM Value in next row Sensitive      >=32 RESISTANTThis is a modified FDA-approved test that has been validated and its performance characteristics determined by the reporting laboratory.  This laboratory is certified under the Clinical Laboratory Improvement Amendments CLIA as qualified to perform high complexity clinical laboratory testing.    CEFTRIAXONE  Value in next row Resistant      >=32 RESISTANTThis is a modified FDA-approved test that has been  validated and its performance characteristics determined by the reporting laboratory.  This laboratory is certified under the Clinical Laboratory Improvement Amendments CLIA as qualified to perform high complexity clinical laboratory testing.    CIPROFLOXACIN Value in next row Resistant      >=32 RESISTANTThis is a modified FDA-approved test that has been validated and its performance characteristics determined by the reporting laboratory.  This laboratory is certified under the Clinical Laboratory Improvement Amendments CLIA as qualified to perform high complexity clinical laboratory testing.    GENTAMICIN  Value in next row Sensitive      >=32 RESISTANTThis is a modified FDA-approved test that has been validated and its performance characteristics determined by the reporting laboratory.  This laboratory is certified under the Clinical Laboratory Improvement Amendments CLIA as qualified to perform high complexity clinical laboratory testing.    NITROFURANTOIN Value in next row Sensitive      >=32 RESISTANTThis is a modified FDA-approved test that has been validated and its performance characteristics determined by the reporting laboratory.  This laboratory is certified under the Clinical Laboratory Improvement Amendments CLIA as qualified to perform high complexity clinical laboratory testing.    TRIMETH /SULFA  Value in next row Resistant      >=32 RESISTANTThis is a modified FDA-approved test that has been validated and its performance characteristics determined by the reporting laboratory.  This laboratory is certified under the Clinical Laboratory Improvement Amendments CLIA as qualified to perform high complexity clinical laboratory testing.    AMPICILLIN /SULBACTAM Value in next row Resistant      >=32 RESISTANTThis is a modified FDA-approved test that has been validated and its performance characteristics determined by the reporting laboratory.  This laboratory is certified under the Clinical  Laboratory Improvement Amendments CLIA as qualified to perform high complexity clinical laboratory testing.    PIP/TAZO Value in next row Sensitive      8 SENSITIVEThis is a modified FDA-approved test that has been validated and its performance characteristics determined by the reporting laboratory.  This laboratory is certified under the Clinical Laboratory Improvement Amendments CLIA as qualified to perform high complexity clinical laboratory testing.    MEROPENEM  Value in next row Sensitive      8 SENSITIVEThis is a modified FDA-approved test that has been validated and its performance characteristics determined by the reporting laboratory.  This laboratory is certified under the Clinical Laboratory Improvement Amendments CLIA as qualified to perform high complexity clinical laboratory testing.    * 80,000 COLONIES/mL ESCHERICHIA COLI    Lines and Device Date on insertion # of days DC  Central line  Foley     ETT       IMAGING RESULTS: CT abdomen and pelvis reviewed Severe inflammation of the left renal pelvis, ureter and bladder I have personally reviewed the films ? Impression/Recommendation History of left hydronephrosis and has had percutaneous nephrostomy with internal stents since 2023 Complicated urinary tract infection of the left pelvis, ureter and and bladder due to ESBL E. coli Patient has a nephrostomy tube also and internal stent with pigtail There is the nephrostomy tube was recently changed a month ago Patient needs at least 10 days of IV carbapenem Today she is day 3 She could get either IV or ertapenem at home or come to the day surgery to get it every day Either way she will need a midline and If it has to be done at home then the family has to be educated.  She lives with her daughter And also home infusion company has to make arrangements before she can be discharged  AKI   Anemia Obesity  COPD  Generalized weakness  History of endometrial CA status  post radiation  History of radiation cystitis  This consult involved complicated antimicrobial management   OPAT Diagnosis: ESBL ecoli complicated UTI  Baseline Creatinine 1.25    No Known Allergies  OPAT Orders Ertapenem 1 gram Iv every 24 hours Duration: 10 days End Date: 04/16/24  [facility] Care Per Protocol:  Labs weekly while on IV antibiotics: X__ CBC with differential  _X_ CMP   _X_ Please pull PIC at completion of IV antibiotics   Fax weekly lab results  promptly to [phone]  Clinic Follow Up Appt:not required   Call [phone] with any questions or crical values    _I have personally spent  -75--minutes involved in face-to-face and non-face-to-face activities for this patient on the day of the visit. Professional time spent includes the following activities: Preparing to see the patient (review of tests), Obtaining and/or reviewing separately obtained history (admission/discharge record), Performing a medically appropriate examination and/or evaluation , Ordering medications/tests/procedures, referring and communicating with other health care professionals, Documenting clinical information in the EMR, Independently interpreting results (not separately reported), Communicating results to the patient/family/caregiver, Counseling and educating the patient/family/caregiver and Care coordination (not separately reported).    ________________________________________________ Discussed with patient, requesting provider Note:  This document was prepared using Dragon voice recognition software and may include unintentional dictation errors.

## 2024-04-09 NOTE — Plan of Care (Signed)
  Problem: Education: Goal: Knowledge of General Education information will improve Description: Including pain rating scale, medication(s)/side effects and non-pharmacologic comfort measures Outcome: Progressing   Problem: Health Behavior/Discharge Planning: Goal: Ability to manage health-related needs will improve Outcome: Progressing   Problem: Clinical Measurements: Goal: Will remain free from infection Outcome: Progressing Goal: Diagnostic test results will improve Outcome: Progressing Goal: Respiratory complications will improve Outcome: Progressing Goal: Cardiovascular complication will be avoided Outcome: Progressing   Problem: Activity: Goal: Risk for activity intolerance will decrease Outcome: Progressing   Problem: Pain Managment: Goal: General experience of comfort will improve and/or be controlled Outcome: Progressing   Problem: Safety: Goal: Ability to remain free from injury will improve Outcome: Progressing   Problem: Skin Integrity: Goal: Risk for impaired skin integrity will decrease Outcome: Progressing

## 2024-04-09 NOTE — Progress Notes (Signed)
 PROGRESS NOTE    SAVINA OLSHEFSKI  FMW:[id number] DOB: 1956/02/22 DOA: 04/06/2024 PCP: [facility]  Chief Complaint  Patient presents with   left sided pain    Hospital Course:  Ms. Anysha Frappier is a 68 year old female with history of DVT/PE on Lovenox , hypertension, GERD, left nephrostomy tube status, hyperlipidemia, who presents to the ED for chief concerns of dysuria for 2 days and subjective fever. Patient admitted for UTI with history of ESBL E. coli, hypotension, AKI   Subjective: Patient was examined at the bedside, reports feeling better today. Reports dysuria and Left flank pain improving Will continue IV antibiotics, ID consulted for recs   Objective: Vitals:   04/09/24 0315 04/09/24 0734 04/09/24 1235 04/09/24 1500  BP: (!) 118/50 (!) 118/49 115/63 (!) 102/53  Pulse: 87 84 85 94  Resp: (!) 22 17 16 17   Temp: 98.1 F (36.7 C) 97.6 F (36.4 C) 97.8 F (36.6 C) 98.1 F (36.7 C)  TempSrc:    Oral  SpO2: 97% 99% 100% 98%  Weight:      Height:        Intake/Output Summary (Last 24 hours) at 04/09/2024 1643 Last data filed at 04/09/2024 1300 Gross per 24 hour  Intake 820 ml  Output 1200 ml  Net -380 ml   Filed Weights   04/06/24 0911  Weight: 90.7 kg    Examination: Constitutional: appears age-appropriate, chronically ill Respiratory: clear to auscultation bilaterally, no wheezing, no crackles Cardiovascular: Regular rate and rhythm, no murmurs / rubs / gallops Abdomen: Morbidly obese abdomen, non tender , BS +,  Nephrostomy tube in place Skin: no rashes, lesions, ulcers. No induration Neurologic: Sensation intact. Strength 5/5 in all 4.  Psychiatric: Normal judgment and insight. Alert and oriented x 3. Normal mood  Assessment & Plan:  UTI due to extended-spectrum beta lactamase (ESBL) producing Escherichia coli CT left percutaneous nephrostomy and double-J ureteral stent in place.  No abscess/fluid collection.  Right renal  6 mm calculus located centrally in the right renal pelvis.  No findings of obstructive uropathy. Ucx ESBL E.coli, reviewed sensitivities On IV Meropenem  ID Consult for antibiotic recs Discussed with Urology, no indication for nephrostomy tube exchange On Trimethoprim  for ppx at home   AKI  Cr inc 1.23 gentle IV fluids, encourage po intake Hold lasix   Hypokalemia - Monitor and replete as needed   Iron  deficiency anemia Acute drop in Hb Resume home iron  supplementation with breakfast  Hb 9.2 -> 7.8 - stable, s/p IV fluids Monitor Hb  Chronic diastolic CHF - Hold lasix  as above   Dyslipidemia Home rosuvastatin  10   Obesity, Class III, BMI 40-49.9 (morbid obesity)   Chronic obstructive pulmonary disease (COPD) (HCC) Not in acute exacerbation Home inhalers   Generalized weakness Fall precaution PT/OT rec Home PT - patient refused, states will follow up with her PCP  DVT prophylaxis: Lovenox  SQ   Code Status: Full Code Disposition:  Home with home health  Consultants:  None  Procedures:  None  Antimicrobials:  Anti-infectives (From admission, onward)    Start     Dose/Rate Route Frequency Ordered Stop   04/09/24 2200  meropenem  (MERREM ) 1 g in sodium chloride  0.9 % 100 mL IVPB        1 g 200 mL/hr over 30 Minutes Intravenous Every 12 hours 04/09/24 0829     04/07/24 2200  meropenem  (MERREM ) 1 g in sodium chloride  0.9 % 100 mL IVPB  Status:  Discontinued  1 g 200 mL/hr over 30 Minutes Intravenous Every 8 hours 04/07/24 1409 04/09/24 0829   04/07/24 1200  vancomycin  (VANCOREADY) IVPB 750 mg/150 mL  Status:  Discontinued        750 mg 150 mL/hr over 60 Minutes Intravenous Every 24 hours 04/07/24 0755 04/07/24 1404   04/06/24 1245  vancomycin  (VANCOREADY) IVPB 2000 mg/400 mL        2,000 mg 200 mL/hr over 120 Minutes Intravenous  Once 04/06/24 1146 04/06/24 1457   04/06/24 1200  meropenem  (MERREM ) 1 g in sodium chloride  0.9 % 100 mL IVPB  Status:   Discontinued        1 g 200 mL/hr over 30 Minutes Intravenous Every 12 hours 04/06/24 1146 04/07/24 1409   04/06/24 1153  vancomycin  variable dose per unstable renal function (pharmacist dosing)  Status:  Discontinued         Does not apply See admin instructions 04/06/24 1153 04/07/24 1151       Data Reviewed: I have personally reviewed following labs and imaging studies CBC: Recent Labs  Lab 04/06/24 0933 04/07/24 0347 04/08/24 0425  WBC 5.0 4.1 4.5  NEUTROABS 3.5  --   --   HGB 9.2* 7.8* 7.8*  HCT 31.7* 26.4* 27.6*  MCV 87.1 86.8 87.3  PLT 307 274 299   Basic Metabolic Panel: Recent Labs  Lab 04/06/24 0933 04/07/24 0347 04/08/24 0425 04/09/24 0318  NA 139 139 139 140  K 4.6 3.9 3.6 3.3*  CL 105 115* 110 106  CO2 24 21* 23 25  GLUCOSE 109* 95 134* 117*  BUN 18 16 15 13   CREATININE 1.25* 0.97 1.06* 1.23*  CALCIUM  9.1 7.9* 8.5* 8.6*  MG  --   --   --  1.9   GFR: Estimated Creatinine Clearance: 42.4 mL/min (A) (by C-G formula based on SCr of 1.23 mg/dL (H)). Liver Function Tests: Recent Labs  Lab 04/06/24 0933  AST 18  ALT 9  ALKPHOS 50  BILITOT 0.9  PROT 6.8  ALBUMIN 3.3*   CBG: No results for input(s): GLUCAP in the last 168 hours.  Recent Results (from the past 240 hours)  Urine Culture     Status: Abnormal   Collection Time: 04/06/24  9:06 AM   Specimen: Urine, Random  Result Value Ref Range Status   Specimen Description   Final    URINE, RANDOM Performed at [facility], [address] [place], [place] [address]    Special Requests   Final    NONE Reflexed from [id number] Performed at [facility], [address] [place], [place] [address]    Culture (A)  Final    80,000 COLONIES/mL ESCHERICHIA COLI Confirmed Extended Spectrum Beta-Lactamase Producer (ESBL).  In bloodstream infections from ESBL organisms, carbapenems are preferred over piperacillin /tazobactam. They are shown to have a lower risk of mortality.    Report  Status 04/08/2024 FINAL  Final   Organism ID, Bacteria ESCHERICHIA COLI (A)  Final      Susceptibility   Escherichia coli - MIC*    AMPICILLIN  >=32 RESISTANT Resistant     CEFAZOLIN  (URINE) Value in next row Resistant      >=32 RESISTANTThis is a modified FDA-approved test that has been validated and its performance characteristics determined by the reporting laboratory.  This laboratory is certified under the Clinical Laboratory Improvement Amendments CLIA as qualified to perform high complexity clinical laboratory testing.    CEFEPIME  Value in next row Resistant      >=  32 RESISTANTThis is a modified FDA-approved test that has been validated and its performance characteristics determined by the reporting laboratory.  This laboratory is certified under the Clinical Laboratory Improvement Amendments CLIA as qualified to perform high complexity clinical laboratory testing.    ERTAPENEM Value in next row Sensitive      >=32 RESISTANTThis is a modified FDA-approved test that has been validated and its performance characteristics determined by the reporting laboratory.  This laboratory is certified under the Clinical Laboratory Improvement Amendments CLIA as qualified to perform high complexity clinical laboratory testing.    CEFTRIAXONE  Value in next row Resistant      >=32 RESISTANTThis is a modified FDA-approved test that has been validated and its performance characteristics determined by the reporting laboratory.  This laboratory is certified under the Clinical Laboratory Improvement Amendments CLIA as qualified to perform high complexity clinical laboratory testing.    CIPROFLOXACIN Value in next row Resistant      >=32 RESISTANTThis is a modified FDA-approved test that has been validated and its performance characteristics determined by the reporting laboratory.  This laboratory is certified under the Clinical Laboratory Improvement Amendments CLIA as qualified to perform high complexity clinical  laboratory testing.    GENTAMICIN  Value in next row Sensitive      >=32 RESISTANTThis is a modified FDA-approved test that has been validated and its performance characteristics determined by the reporting laboratory.  This laboratory is certified under the Clinical Laboratory Improvement Amendments CLIA as qualified to perform high complexity clinical laboratory testing.    NITROFURANTOIN Value in next row Sensitive      >=32 RESISTANTThis is a modified FDA-approved test that has been validated and its performance characteristics determined by the reporting laboratory.  This laboratory is certified under the Clinical Laboratory Improvement Amendments CLIA as qualified to perform high complexity clinical laboratory testing.    TRIMETH /SULFA  Value in next row Resistant      >=32 RESISTANTThis is a modified FDA-approved test that has been validated and its performance characteristics determined by the reporting laboratory.  This laboratory is certified under the Clinical Laboratory Improvement Amendments CLIA as qualified to perform high complexity clinical laboratory testing.    AMPICILLIN /SULBACTAM Value in next row Resistant      >=32 RESISTANTThis is a modified FDA-approved test that has been validated and its performance characteristics determined by the reporting laboratory.  This laboratory is certified under the Clinical Laboratory Improvement Amendments CLIA as qualified to perform high complexity clinical laboratory testing.    PIP/TAZO Value in next row Sensitive      8 SENSITIVEThis is a modified FDA-approved test that has been validated and its performance characteristics determined by the reporting laboratory.  This laboratory is certified under the Clinical Laboratory Improvement Amendments CLIA as qualified to perform high complexity clinical laboratory testing.    MEROPENEM  Value in next row Sensitive      8 SENSITIVEThis is a modified FDA-approved test that has been validated and its  performance characteristics determined by the reporting laboratory.  This laboratory is certified under the Clinical Laboratory Improvement Amendments CLIA as qualified to perform high complexity clinical laboratory testing.    * 80,000 COLONIES/mL ESCHERICHIA COLI     Radiology Studies: DG Chest 1 View Result Date: 04/07/2024 EXAM: 1 VIEW(S) XRAY OF THE CHEST 04/07/2024 07:24:12 PM COMPARISON: 08/27/2023 CLINICAL HISTORY: Pulmonary edema FINDINGS: LUNGS AND PLEURA: Low lung volumes. No focal pulmonary opacity. No pulmonary edema. No pleural effusion. No pneumothorax. HEART AND MEDIASTINUM: Aortic atherosclerosis. No  acute abnormality of the cardiac and mediastinal silhouettes. BONES AND SOFT TISSUES: No acute osseous abnormality. IMPRESSION: 1. No acute abnormalities. Electronically signed by: Norman Gatlin MD 04/07/2024 09:29 PM EDT RP Workstation: [id number]    Scheduled Meds:  vitamin C   1,000 mg Oral Daily   enoxaparin   30 mg Subcutaneous Q12H   ferrous sulfate   325 mg Oral Q breakfast   fluticasone  furoate-vilanterol  1 puff Inhalation Daily   pantoprazole   40 mg Oral BID   polyethylene glycol  17 g Oral Daily   rosuvastatin   10 mg Oral QHS   senna-docusate  1 tablet Oral BID   cyanocobalamin   100 mcg Oral Daily   Continuous Infusions:  lactated ringers  50 mL/hr at 04/09/24 0903   meropenem  (MERREM ) IV       LOS: 3 days  MDM: Patient is high risk for one or more organ failure.  They necessitate ongoing hospitalization for continued IV therapies and subsequent lab monitoring. Total time spent interpreting labs and vitals, reviewing the medical record, coordinating care amongst consultants and care team members, directly assessing and discussing care with the patient and/or family: 55 min Laree Lock, MD [facility]  To contact the attending physician between 7A-7P please use Epic Chat. To contact the covering physician during after hours 7P-7A, please review Amion.   04/09/2024, 4:43 PM   *This document has been created with the assistance of dictation software. Please excuse typographical errors. *

## 2024-04-10 DIAGNOSIS — Z1612 Extended spectrum beta lactamase (ESBL) resistance: Secondary | ICD-10-CM | POA: Diagnosis not present

## 2024-04-10 DIAGNOSIS — B9629 Other Escherichia coli [E. coli] as the cause of diseases classified elsewhere: Secondary | ICD-10-CM | POA: Diagnosis not present

## 2024-04-10 DIAGNOSIS — N390 Urinary tract infection, site not specified: Secondary | ICD-10-CM | POA: Diagnosis not present

## 2024-04-10 LAB — BASIC METABOLIC PANEL WITH GFR
Anion gap: 11 (ref 5–15)
BUN: 14 mg/dL (ref 8–23)
CO2: 21 mmol/L — ABNORMAL LOW (ref 22–32)
Calcium: 8.5 mg/dL — ABNORMAL LOW (ref 8.9–10.3)
Chloride: 108 mmol/L (ref 98–111)
Creatinine, Ser: 0.83 mg/dL (ref 0.44–1.00)
GFR, Estimated: >60 mL/min (ref >60)
Glucose, Bld: 104 mg/dL — ABNORMAL HIGH (ref 70–99)
Potassium: 4.2 mmol/L (ref 3.5–5.1)
Sodium: 140 mmol/L (ref 135–145)

## 2024-04-10 LAB — HEMOGLOBIN: Hemoglobin: 8.3 g/dL — ABNORMAL LOW (ref 12.0–15.0)

## 2024-04-10 LAB — MAGNESIUM: Magnesium: 2.1 mg/dL (ref 1.7–2.4)

## 2024-04-10 MED ORDER — SODIUM CHLORIDE 0.9 % IV BOLUS
250.0000 mL | INTRAVENOUS | Status: AC
  Administered 2024-04-10: 250 mL via INTRAVENOUS

## 2024-04-10 MED ORDER — MIDODRINE HCL 5 MG PO TABS
10.0000 mg | ORAL_TABLET | ORAL | Status: AC
  Administered 2024-04-10: 10 mg via ORAL
  Filled 2024-04-10: qty 2

## 2024-04-10 MED ORDER — PNEUMOCOCCAL 20-VAL CONJ VACC 0.5 ML IM SUSY
0.5000 mL | PREFILLED_SYRINGE | INTRAMUSCULAR | Status: AC
  Administered 2024-04-12: 0.5 mL via INTRAMUSCULAR
  Filled 2024-04-10: qty 0.5

## 2024-04-10 MED ORDER — SODIUM CHLORIDE 0.9 % IV SOLN
1.0000 g | Freq: Three times a day (TID) | INTRAVENOUS | Status: DC
  Administered 2024-04-10: 1 g via INTRAVENOUS
  Filled 2024-04-10 (×2): qty 20

## 2024-04-10 MED ORDER — ERTAPENEM SODIUM 1 G IJ SOLR
1.0000 g | INTRAMUSCULAR | Status: DC
  Administered 2024-04-10 – 2024-04-14 (×5): 1 g via INTRAVENOUS
  Filled 2024-04-10 (×5): qty 1000

## 2024-04-10 MED ORDER — INFLUENZA VAC SPLIT HIGH-DOSE 0.5 ML IM SUSY
0.5000 mL | PREFILLED_SYRINGE | INTRAMUSCULAR | Status: AC
  Administered 2024-04-12: 0.5 mL via INTRAMUSCULAR
  Filled 2024-04-10: qty 0.5

## 2024-04-10 NOTE — Progress Notes (Signed)

## 2024-04-10 NOTE — Progress Notes (Signed)
 IV Team received a consult to place a midline for home antibiotic;  pt assessed, and has a cephalic vein in the LUA which appears healthy and compressible;  pt currently has a PIV in the R forearm;  MD at bedside; will hold Midline placement until closer to discharge date.  Pt and MD in agreement.  Please re-consult the IV Nurse on day of discharge.  Thank you!

## 2024-04-10 NOTE — Progress Notes (Signed)
 PHARMACY NOTE:  ANTIMICROBIAL RENAL DOSAGE ADJUSTMENT  Current antimicrobial regimen includes a mismatch between antimicrobial dosage and estimated renal function.  As per policy approved by the Pharmacy & Therapeutics and Medical Executive Committees, the antimicrobial dosage will be adjusted accordingly.  Current antimicrobial dosage:  meropenem  1g IV every 12 hours   Renal Function:  Estimated Creatinine Clearance: 62.8 mL/min (by C-G formula based on SCr of 0.83 mg/dL).     Antimicrobial dosage has been changed to:  Meropenem  IV every 8 hours   Additional comments:   Thank you for allowing pharmacy to be a part of this patient's care.  Estill CHRISTELLA Lutes, PharmD, BCPS Clinical Pharmacist 04/10/2024 6:32 AM

## 2024-04-10 NOTE — Progress Notes (Signed)
 PROGRESS NOTE    Courtney Mack  FMW:[id number] DOB: [birth year] DOA: 04/06/2024 PCP: [facility]  Chief Complaint  Patient presents with   left sided pain    Hospital Course:  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 Patient admitted for UTI with history of ESBL E. coli, hypotension, AKI   Subjective: Patient was examined at the bedside, reports feeling better today. Reports dysuria improving, Left flank pain resolved Plan is to discharge patient with home antibiotics, TOC setting up Called daughter Ronal Mack and provided updates, anticipate discharge tomorrow   Objective: Vitals:   04/09/24 2329 04/10/24 0352 04/10/24 0732 04/10/24 1126  BP: (!) 105/48 (!) 110/49 (!) 123/52 (!) 129/47  Pulse: 88 88 80 83  Resp: 20 20 16 18   Temp: 98.3 F (36.8 C) 97.9 F (36.6 C) 98.4 F (36.9 C) 97.8 F (36.6 C)  TempSrc:    Oral  SpO2: 98% 95% 98% 96%  Weight:      Height:        Intake/Output Summary (Last 24 hours) at 04/10/2024 1542 Last data filed at 04/10/2024 1405 Gross per 24 hour  Intake 460.79 ml  Output 450 ml  Net 10.79 ml   Filed Weights   04/06/24 0911  Weight: 90.7 kg    Examination: Constitutional: appears age-appropriate Respiratory: clear to auscultation bilaterally, no wheezing, no crackles Cardiovascular: Regular rate and rhythm, no murmurs / rubs / gallops Abdomen: Morbidly obese abdomen, non tender , BS +,  Nephrostomy tube in place Skin: no rashes, lesions, ulcers. No induration Neurologic: Sensation intact. Strength 5/5 in all 4.  Psychiatric: Normal judgment and insight. Alert and oriented x 3. Normal mood  Assessment & Plan:  UTI due to extended-spectrum beta lactamase (ESBL) producing Escherichia coli CT left percutaneous  nephrostomy and double-J ureteral stent in place.  No abscess/fluid collection.  Right renal 6 mm calculus located centrally in the right renal pelvis.  No findings of obstructive uropathy. Ucx ESBL E.coli, reviewed sensitivities Was on IV Meropenem , discharge on IV Ertapenem until 04/16/24 to complete 10 days Seen by ID, appreciate recs Discussed with Urology, no indication for nephrostomy tube exchange On Trimethoprim  for ppx at home Midline to be placed today   AKI - resolved S/p gentle IV fluids, Hold lasix   Hypokalemia - resolved - Monitor and replete as needed   Iron  deficiency anemia Acute drop in Hb Resume home iron  supplementation with breakfast  Hb 9.2 -> 7.8 -> 8.3 - stable, s/p IV fluids Monitor Hb  Chronic diastolic CHF - Hold lasix  as above   Dyslipidemia Home rosuvastatin  10   Obesity, Class III, BMI 40-49.9 (morbid obesity)   Chronic obstructive pulmonary disease (COPD) (HCC) Not in acute exacerbation Home inhalers   Generalized weakness Fall precaution PT/OT rec Home PT - patient refused, states will follow up with her PCP TOC working on home infusion  DVT prophylaxis: Lovenox  SQ   Code Status: Full Code Disposition:  Home with home health, Home abx infusion  Consultants:  Infectious disease  Procedures:  None  Antimicrobials:  Anti-infectives (From admission, onward)    Start     Dose/Rate Route Frequency Ordered Stop   04/10/24 1630  ertapenem (INVANZ) 1 g in sodium chloride  0.9 % 100 mL IVPB        1 g  200 mL/hr over 30 Minutes Intravenous Every 24 hours 04/10/24 1542     04/10/24 1400  meropenem  (MERREM ) 1 g in sodium chloride  0.9 % 100 mL IVPB  Status:  Discontinued        1 g 200 mL/hr over 30 Minutes Intravenous Every 8 hours 04/10/24 0631 04/10/24 1542   04/09/24 2200  meropenem  (MERREM ) 1 g in sodium chloride  0.9 % 100 mL IVPB  Status:  Discontinued        1 g 200 mL/hr over 30 Minutes Intravenous Every 12 hours 04/09/24 0829  04/10/24 0632   04/07/24 2200  meropenem  (MERREM ) 1 g in sodium chloride  0.9 % 100 mL IVPB  Status:  Discontinued        1 g 200 mL/hr over 30 Minutes Intravenous Every 8 hours 04/07/24 1409 04/09/24 0829   04/07/24 1200  vancomycin  (VANCOREADY) IVPB 750 mg/150 mL  Status:  Discontinued        750 mg 150 mL/hr over 60 Minutes Intravenous Every 24 hours 04/07/24 0755 04/07/24 1404   04/06/24 1245  vancomycin  (VANCOREADY) IVPB 2000 mg/400 mL        2,000 mg 200 mL/hr over 120 Minutes Intravenous  Once 04/06/24 1146 04/06/24 1457   04/06/24 1200  meropenem  (MERREM ) 1 g in sodium chloride  0.9 % 100 mL IVPB  Status:  Discontinued        1 g 200 mL/hr over 30 Minutes Intravenous Every 12 hours 04/06/24 1146 04/07/24 1409   04/06/24 1153  vancomycin  variable dose per unstable renal function (pharmacist dosing)  Status:  Discontinued         Does not apply See admin instructions 04/06/24 1153 04/07/24 1151       Data Reviewed: I have personally reviewed following labs and imaging studies CBC: Recent Labs  Lab 04/06/24 0933 04/07/24 0347 04/08/24 0425 04/10/24 0440  WBC 5.0 4.1 4.5  --   NEUTROABS 3.5  --   --   --   HGB 9.2* 7.8* 7.8* 8.3*  HCT 31.7* 26.4* 27.6*  --   MCV 87.1 86.8 87.3  --   PLT 307 274 299  --    Basic Metabolic Panel: Recent Labs  Lab 04/06/24 0933 04/07/24 0347 04/08/24 0425 04/09/24 0318 04/10/24 0440  NA 139 139 139 140 140  K 4.6 3.9 3.6 3.3* 4.2  CL 105 115* 110 106 108  CO2 24 21* 23 25 21*  GLUCOSE 109* 95 134* 117* 104*  BUN 18 16 15 13 14   CREATININE 1.25* 0.97 1.06* 1.23* 0.83  CALCIUM  9.1 7.9* 8.5* 8.6* 8.5*  MG  --   --   --  1.9 2.1   GFR: Estimated Creatinine Clearance: 62.8 mL/min (by C-G formula based on SCr of 0.83 mg/dL). Liver Function Tests: Recent Labs  Lab 04/06/24 0933  AST 18  ALT 9  ALKPHOS 50  BILITOT 0.9  PROT 6.8  ALBUMIN 3.3*   CBG: No results for input(s): GLUCAP in the last 168 hours.  Recent Results  (from the past 240 hours)  Urine Culture     Status: Abnormal   Collection Time: 04/06/24  9:06 AM   Specimen: Urine, Random  Result Value Ref Range Status   Specimen Description   Final    URINE, RANDOM Performed at [facility], [address], [place], [place] [address]    Special Requests   Final    NONE Reflexed from [id number] Performed at [facility], [address] [place], [place] [address]    Culture (A)  Final    80,000 COLONIES/mL ESCHERICHIA COLI Confirmed Extended Spectrum Beta-Lactamase Producer (ESBL).  In bloodstream infections from ESBL organisms, carbapenems are preferred over piperacillin /tazobactam. They are shown to have a lower risk of mortality.    Report Status 04/08/2024 FINAL  Final   Organism ID, Bacteria ESCHERICHIA COLI (A)  Final      Susceptibility   Escherichia coli - MIC*    AMPICILLIN  >=32 RESISTANT Resistant     CEFAZOLIN  (URINE) Value in next row Resistant      >=32 RESISTANTThis is a modified FDA-approved test that has been validated and its performance characteristics determined by the reporting laboratory.  This laboratory is certified under the Clinical Laboratory Improvement Amendments CLIA as qualified to perform high complexity clinical laboratory testing.    CEFEPIME  Value in next row Resistant      >=32 RESISTANTThis is a modified FDA-approved test that has been validated and its performance characteristics determined by the reporting laboratory.  This laboratory is certified under the Clinical Laboratory Improvement Amendments CLIA as qualified to perform high complexity clinical laboratory testing.    ERTAPENEM Value in next row Sensitive      >=32 RESISTANTThis is a modified FDA-approved test that has been validated and its performance characteristics determined by the reporting laboratory.  This laboratory is certified under the Clinical Laboratory Improvement Amendments CLIA as qualified to perform high complexity  clinical laboratory testing.    CEFTRIAXONE  Value in next row Resistant      >=32 RESISTANTThis is a modified FDA-approved test that has been validated and its performance characteristics determined by the reporting laboratory.  This laboratory is certified under the Clinical Laboratory Improvement Amendments CLIA as qualified to perform high complexity clinical laboratory testing.    CIPROFLOXACIN Value in next row Resistant      >=32 RESISTANTThis is a modified FDA-approved test that has been validated and its performance characteristics determined by the reporting laboratory.  This laboratory is certified under the Clinical Laboratory Improvement Amendments CLIA as qualified to perform high complexity clinical laboratory testing.    GENTAMICIN  Value in next row Sensitive      >=32 RESISTANTThis is a modified FDA-approved test that has been validated and its performance characteristics determined by the reporting laboratory.  This laboratory is certified under the Clinical Laboratory Improvement Amendments CLIA as qualified to perform high complexity clinical laboratory testing.    NITROFURANTOIN Value in next row Sensitive      >=32 RESISTANTThis is a modified FDA-approved test that has been validated and its performance characteristics determined by the reporting laboratory.  This laboratory is certified under the Clinical Laboratory Improvement Amendments CLIA as qualified to perform high complexity clinical laboratory testing.    TRIMETH /SULFA  Value in next row Resistant      >=32 RESISTANTThis is a modified FDA-approved test that has been validated and its performance characteristics determined by the reporting laboratory.  This laboratory is certified under the Clinical Laboratory Improvement Amendments CLIA as qualified to perform high complexity clinical laboratory testing.    AMPICILLIN /SULBACTAM Value in next row Resistant      >=32 RESISTANTThis is a modified FDA-approved test that has been  validated and its performance characteristics determined by the reporting laboratory.  This laboratory is certified under the Clinical Laboratory Improvement Amendments CLIA as qualified to perform high complexity clinical laboratory testing.    PIP/TAZO Value in next row Sensitive      8 SENSITIVEThis is  a modified FDA-approved test that has been validated and its performance characteristics determined by the reporting laboratory.  This laboratory is certified under the Clinical Laboratory Improvement Amendments CLIA as qualified to perform high complexity clinical laboratory testing.    MEROPENEM  Value in next row Sensitive      8 SENSITIVEThis is a modified FDA-approved test that has been validated and its performance characteristics determined by the reporting laboratory.  This laboratory is certified under the Clinical Laboratory Improvement Amendments CLIA as qualified to perform high complexity clinical laboratory testing.    * 80,000 COLONIES/mL ESCHERICHIA COLI     Radiology Studies: No results found.   Scheduled Meds:  vitamin C   1,000 mg Oral Daily   enoxaparin   30 mg Subcutaneous Q12H   ferrous sulfate   325 mg Oral Q breakfast   fluticasone  furoate-vilanterol  1 puff Inhalation Daily   pantoprazole   40 mg Oral BID   polyethylene glycol  17 g Oral Daily   rosuvastatin   10 mg Oral QHS   senna-docusate  1 tablet Oral BID   cyanocobalamin   100 mcg Oral Daily   Continuous Infusions:  ertapenem       LOS: 4 days  MDM: Patient is high risk for one or more organ failure.  They necessitate ongoing hospitalization for continued IV therapies and subsequent lab monitoring. Total time spent interpreting labs and vitals, reviewing the medical record, coordinating care amongst consultants and care team members, directly assessing and discussing care with the patient and/or family: 55 min Laree Lock, MD [facility]  To contact the attending physician between 7A-7P please use  Epic Chat. To contact the covering physician during after hours 7P-7A, please review Amion.  04/10/2024, 3:42 PM   *This document has been created with the assistance of dictation software. Please excuse typographical errors. *

## 2024-04-10 NOTE — Plan of Care (Signed)

## 2024-04-11 DIAGNOSIS — B9629 Other Escherichia coli [E. coli] as the cause of diseases classified elsewhere: Secondary | ICD-10-CM | POA: Diagnosis not present

## 2024-04-11 DIAGNOSIS — Z1612 Extended spectrum beta lactamase (ESBL) resistance: Secondary | ICD-10-CM | POA: Diagnosis not present

## 2024-04-11 DIAGNOSIS — N390 Urinary tract infection, site not specified: Secondary | ICD-10-CM | POA: Diagnosis not present

## 2024-04-11 LAB — BASIC METABOLIC PANEL WITH GFR
Anion gap: 8 (ref 5–15)
BUN: 13 mg/dL (ref 8–23)
CO2: 22 mmol/L (ref 22–32)
Calcium: 8.5 mg/dL — ABNORMAL LOW (ref 8.9–10.3)
Chloride: 109 mmol/L (ref 98–111)
Creatinine, Ser: 0.77 mg/dL (ref 0.44–1.00)
GFR, Estimated: >60 mL/min (ref >60)
Glucose, Bld: 106 mg/dL — ABNORMAL HIGH (ref 70–99)
Potassium: 3.9 mmol/L (ref 3.5–5.1)
Sodium: 139 mmol/L (ref 135–145)

## 2024-04-11 MED ORDER — FUROSEMIDE 20 MG PO TABS
20.0000 mg | ORAL_TABLET | Freq: Every day | ORAL | Status: DC
  Administered 2024-04-11: 20 mg via ORAL
  Filled 2024-04-11 (×2): qty 1

## 2024-04-11 NOTE — Plan of Care (Signed)

## 2024-04-11 NOTE — Progress Notes (Signed)
 PROGRESS NOTE    Courtney Mack  FMW:[id number] DOB: 11-23-1955 DOA: 04/06/2024 PCP: [facility]  Chief Complaint  Patient presents with   left sided pain    Hospital Course:  Ms. 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 Patient admitted for UTI with history of ESBL E. coli, hypotension, AKI   Subjective: Patient was examined at the bedside, reports feeling better today. Reports dysuria improving, Left flank pain resolved Plan is to discharge patient with home antibiotics, TOC setting up Discussed with daughter Courtney Mack and discussed the need for home abx. Patient reports they are not comfortable doing home infusions Dicussed with TOC - Home abx vs daily antibiotics at the infusion center. Patient is open to going to SNF for abx if needed   Objective: Vitals:   04/11/24 0333 04/11/24 0723 04/11/24 1047 04/11/24 1500  BP: (!) 112/45 (!) 114/45 118/66 (!) 106/52  Pulse: 84 79 90 84  Resp: 16 18 18 16   Temp: 98.4 F (36.9 C) 98.1 F (36.7 C) 98.3 F (36.8 C) 97.7 F (36.5 C)  TempSrc:  Oral Oral Oral  SpO2: 97% 99% 97% 97%  Weight:      Height:        Intake/Output Summary (Last 24 hours) at 04/11/2024 1603 Last data filed at 04/11/2024 1500 Gross per 24 hour  Intake 982.64 ml  Output 500 ml  Net 482.64 ml   Filed Weights   04/06/24 0911  Weight: 90.7 kg    Examination: Constitutional: appears age-appropriate Respiratory: clear to auscultation bilaterally, no wheezing, no crackles Cardiovascular: Regular rate and rhythm, no murmurs / rubs / gallops Abdomen: Morbidly obese abdomen, non tender , BS +,  Nephrostomy tube in place Skin: no rashes, lesions, ulcers. No induration Neurologic: Sensation intact. Strength 5/5 in all 4.  Psychiatric: Normal  judgment and insight. Alert and oriented x 3. Normal mood  Assessment & Plan:  UTI due to extended-spectrum beta lactamase (ESBL) producing Escherichia coli CT left percutaneous nephrostomy and double-J ureteral stent in place.  No abscess/fluid collection.  Right renal 6 mm calculus located centrally in the right renal pelvis.  No findings of obstructive uropathy. Ucx ESBL E.coli, reviewed sensitivities Was on IV Meropenem , discharge on IV Ertapenem until 04/16/24 to complete 10 days Seen by ID, appreciate recs Discussed with Urology, no indication for nephrostomy tube exchange On Trimethoprim  for ppx at home  AKI - resolved S/p gentle IV fluids  Hypokalemia - resolved - Monitor and replete as needed   Iron  deficiency anemia Acute drop in Hb Resume home iron  supplementation with breakfast  Hb 9.2 -> 7.8 -> 8.3 - stable, s/p IV fluids Monitor Hb  Chronic diastolic CHF - Resume home lasix  10/05   Dyslipidemia Home rosuvastatin  10   Obesity, Class III, BMI 40-49.9 (morbid obesity)   Chronic obstructive pulmonary disease (COPD) (HCC) Not in acute exacerbation Home inhalers   Generalized weakness Fall precaution PT/OT rec Home PT - patient refused, states will follow up with her PCP TOC working on home infusion  DVT prophylaxis: Lovenox  SQ   Code Status: Full Code Disposition:  Home with home health, Home abx infusion  Consultants:  Infectious disease  Procedures:  None  Antimicrobials:  Anti-infectives (From admission, onward)    Start     Dose/Rate Route Frequency Ordered Stop  04/10/24 2100  ertapenem (INVANZ) 1 g in sodium chloride  0.9 % 100 mL IVPB        1 g 200 mL/hr over 30 Minutes Intravenous Every 24 hours 04/10/24 1542     04/10/24 1400  meropenem  (MERREM ) 1 g in sodium chloride  0.9 % 100 mL IVPB  Status:  Discontinued        1 g 200 mL/hr over 30 Minutes Intravenous Every 8 hours 04/10/24 0631 04/10/24 1542   04/09/24 2200  meropenem  (MERREM ) 1 g  in sodium chloride  0.9 % 100 mL IVPB  Status:  Discontinued        1 g 200 mL/hr over 30 Minutes Intravenous Every 12 hours 04/09/24 0829 04/10/24 0632   04/07/24 2200  meropenem  (MERREM ) 1 g in sodium chloride  0.9 % 100 mL IVPB  Status:  Discontinued        1 g 200 mL/hr over 30 Minutes Intravenous Every 8 hours 04/07/24 1409 04/09/24 0829   04/07/24 1200  vancomycin  (VANCOREADY) IVPB 750 mg/150 mL  Status:  Discontinued        750 mg 150 mL/hr over 60 Minutes Intravenous Every 24 hours 04/07/24 0755 04/07/24 1404   04/06/24 1245  vancomycin  (VANCOREADY) IVPB 2000 mg/400 mL        2,000 mg 200 mL/hr over 120 Minutes Intravenous  Once 04/06/24 1146 04/06/24 1457   04/06/24 1200  meropenem  (MERREM ) 1 g in sodium chloride  0.9 % 100 mL IVPB  Status:  Discontinued        1 g 200 mL/hr over 30 Minutes Intravenous Every 12 hours 04/06/24 1146 04/07/24 1409   04/06/24 1153  vancomycin  variable dose per unstable renal function (pharmacist dosing)  Status:  Discontinued         Does not apply See admin instructions 04/06/24 1153 04/07/24 1151       Data Reviewed: I have personally reviewed following labs and imaging studies CBC: Recent Labs  Lab 04/06/24 0933 04/07/24 0347 04/08/24 0425 04/10/24 0440  WBC 5.0 4.1 4.5  --   NEUTROABS 3.5  --   --   --   HGB 9.2* 7.8* 7.8* 8.3*  HCT 31.7* 26.4* 27.6*  --   MCV 87.1 86.8 87.3  --   PLT 307 274 299  --    Basic Metabolic Panel: Recent Labs  Lab 04/07/24 0347 04/08/24 0425 04/09/24 0318 04/10/24 0440 04/11/24 0307  NA 139 139 140 140 139  K 3.9 3.6 3.3* 4.2 3.9  CL 115* 110 106 108 109  CO2 21* 23 25 21* 22  GLUCOSE 95 134* 117* 104* 106*  BUN 16 15 13 14 13   CREATININE 0.97 1.06* 1.23* 0.83 0.77  CALCIUM  7.9* 8.5* 8.6* 8.5* 8.5*  MG  --   --  1.9 2.1  --    GFR: Estimated Creatinine Clearance: 65.2 mL/min (by C-G formula based on SCr of 0.77 mg/dL). Liver Function Tests: Recent Labs  Lab 04/06/24 0933  AST 18  ALT 9   ALKPHOS 50  BILITOT 0.9  PROT 6.8  ALBUMIN 3.3*   CBG: No results for input(s): GLUCAP in the last 168 hours.  Recent Results (from the past 240 hours)  Urine Culture     Status: Abnormal   Collection Time: 04/06/24  9:06 AM   Specimen: Urine, Random  Result Value Ref Range Status   Specimen Description   Final    URINE, RANDOM Performed at [facility], [address], [place], [place]  [address]    Special Requests   Final    NONE Reflexed from [id number] Performed at [facility], [address] [place], [place] [address]    Culture (A)  Final    80,000 COLONIES/mL ESCHERICHIA COLI Confirmed Extended Spectrum Beta-Lactamase Producer (ESBL).  In bloodstream infections from ESBL organisms, carbapenems are preferred over piperacillin /tazobactam. They are shown to have a lower risk of mortality.    Report Status 04/08/2024 FINAL  Final   Organism ID, Bacteria ESCHERICHIA COLI (A)  Final      Susceptibility   Escherichia coli - MIC*    AMPICILLIN  >=32 RESISTANT Resistant     CEFAZOLIN  (URINE) Value in next row Resistant      >=32 RESISTANTThis is a modified FDA-approved test that has been validated and its performance characteristics determined by the reporting laboratory.  This laboratory is certified under the Clinical Laboratory Improvement Amendments CLIA as qualified to perform high complexity clinical laboratory testing.    CEFEPIME  Value in next row Resistant      >=32 RESISTANTThis is a modified FDA-approved test that has been validated and its performance characteristics determined by the reporting laboratory.  This laboratory is certified under the Clinical Laboratory Improvement Amendments CLIA as qualified to perform high complexity clinical laboratory testing.    ERTAPENEM Value in next row Sensitive      >=32 RESISTANTThis is a modified FDA-approved test that has been validated and its performance characteristics determined by the reporting  laboratory.  This laboratory is certified under the Clinical Laboratory Improvement Amendments CLIA as qualified to perform high complexity clinical laboratory testing.    CEFTRIAXONE  Value in next row Resistant      >=32 RESISTANTThis is a modified FDA-approved test that has been validated and its performance characteristics determined by the reporting laboratory.  This laboratory is certified under the Clinical Laboratory Improvement Amendments CLIA as qualified to perform high complexity clinical laboratory testing.    CIPROFLOXACIN Value in next row Resistant      >=32 RESISTANTThis is a modified FDA-approved test that has been validated and its performance characteristics determined by the reporting laboratory.  This laboratory is certified under the Clinical Laboratory Improvement Amendments CLIA as qualified to perform high complexity clinical laboratory testing.    GENTAMICIN  Value in next row Sensitive      >=32 RESISTANTThis is a modified FDA-approved test that has been validated and its performance characteristics determined by the reporting laboratory.  This laboratory is certified under the Clinical Laboratory Improvement Amendments CLIA as qualified to perform high complexity clinical laboratory testing.    NITROFURANTOIN Value in next row Sensitive      >=32 RESISTANTThis is a modified FDA-approved test that has been validated and its performance characteristics determined by the reporting laboratory.  This laboratory is certified under the Clinical Laboratory Improvement Amendments CLIA as qualified to perform high complexity clinical laboratory testing.    TRIMETH /SULFA  Value in next row Resistant      >=32 RESISTANTThis is a modified FDA-approved test that has been validated and its performance characteristics determined by the reporting laboratory.  This laboratory is certified under the Clinical Laboratory Improvement Amendments CLIA as qualified to perform high complexity clinical  laboratory testing.    AMPICILLIN /SULBACTAM Value in next row Resistant      >=32 RESISTANTThis is a modified FDA-approved test that has been validated and its performance characteristics determined by the reporting laboratory.  This laboratory is certified under the Clinical Laboratory Improvement Amendments CLIA as qualified  to perform high complexity clinical laboratory testing.    PIP/TAZO Value in next row Sensitive      8 SENSITIVEThis is a modified FDA-approved test that has been validated and its performance characteristics determined by the reporting laboratory.  This laboratory is certified under the Clinical Laboratory Improvement Amendments CLIA as qualified to perform high complexity clinical laboratory testing.    MEROPENEM  Value in next row Sensitive      8 SENSITIVEThis is a modified FDA-approved test that has been validated and its performance characteristics determined by the reporting laboratory.  This laboratory is certified under the Clinical Laboratory Improvement Amendments CLIA as qualified to perform high complexity clinical laboratory testing.    * 80,000 COLONIES/mL ESCHERICHIA COLI     Radiology Studies: No results found.   Scheduled Meds:  vitamin C   1,000 mg Oral Daily   enoxaparin   30 mg Subcutaneous Q12H   ferrous sulfate   325 mg Oral Q breakfast   fluticasone  furoate-vilanterol  1 puff Inhalation Daily   furosemide   20 mg Oral Daily   Influenza vac split trivalent PF  0.5 mL Intramuscular Tomorrow-1000   pantoprazole   40 mg Oral BID   pneumococcal 20-valent conjugate vaccine  0.5 mL Intramuscular Tomorrow-1000   rosuvastatin   10 mg Oral QHS   cyanocobalamin   100 mcg Oral Daily   Continuous Infusions:  ertapenem Stopped (04/10/24 2120)     LOS: 5 days  MDM: Patient is high risk for one or more organ failure.  They necessitate ongoing hospitalization for continued IV therapies and subsequent lab monitoring. Total time spent interpreting labs and vitals,  reviewing the medical record, coordinating care amongst consultants and care team members, directly assessing and discussing care with the patient and/or family: 35 min Laree Lock, MD [facility]  To contact the attending physician between 7A-7P please use Epic Chat. To contact the covering physician during after hours 7P-7A, please review Amion.  04/11/2024, 4:03 PM   *This document has been created with the assistance of dictation software. Please excuse typographical errors. *

## 2024-04-12 DIAGNOSIS — Z1612 Extended spectrum beta lactamase (ESBL) resistance: Secondary | ICD-10-CM | POA: Diagnosis not present

## 2024-04-12 DIAGNOSIS — B9629 Other Escherichia coli [E. coli] as the cause of diseases classified elsewhere: Secondary | ICD-10-CM | POA: Diagnosis not present

## 2024-04-12 DIAGNOSIS — N390 Urinary tract infection, site not specified: Secondary | ICD-10-CM | POA: Diagnosis not present

## 2024-04-12 MED ORDER — FUROSEMIDE 20 MG PO TABS
20.0000 mg | ORAL_TABLET | Freq: Every day | ORAL | Status: DC
  Administered 2024-04-13 – 2024-04-14 (×2): 20 mg via ORAL
  Filled 2024-04-12 (×2): qty 1

## 2024-04-12 NOTE — Progress Notes (Signed)
 Mobility Specialist - Progress Not  Pre-mobility: HR-84, BP-113/65, SpO2-97%  Post-mobility: HR, BP, SPO2-98%     04/12/24 1100  Therapy Vitals  BP 113/65  Patient Position (if appropriate) Sitting  Oxygen Therapy  SpO2 97 %  O2 Device Room Air  Mobility  Activity Ambulated with assistance  Level of Assistance Contact guard assist, steadying assist  Assistive Device Front wheel walker  Distance Ambulated (ft) 35 ft  Range of Motion/Exercises Active  Activity Response Tolerated fair  Mobility visit 1 Mobility  Mobility Specialist Start Time (ACUTE ONLY) 1045  Mobility Specialist Stop Time (ACUTE ONLY) 1104  Mobility Specialist Time Calculation (min) (ACUTE ONLY) 19 min   Pt was in the recliner upon entry. Pt agreed to mobility. Pt did state that she has minor hip pain also has pain in both knees. Pt was able to STS with CGA MinA ans 2WW. Pt was able to ambulate within the room due to shortness of breath. Pt was able to due lateral movement activities and back pedals. Pt was also able to complete STS activities as well. Pt did state fatigue during activity. But recovered quickly. Gait pattern short and slow. After activity pt returned to recliner with all needs in reach and chair alarm on.  Clem Rodes Mobility Specialist 04/12/24, 11:13 AM

## 2024-04-12 NOTE — Progress Notes (Signed)
 Physical Therapy Treatment Patient Details Name: Courtney Mack MRN: [id number] DOB: 10-09-1955 Today's Date: 04/12/2024   History of Present Illness Pt is a 68 y/o F admitted on 04/06/24 after presenting with c/o L sided pain & dysuria. Pt is being treated for UTI 2/2 ESBL producing E. Coli. PMH: DVT/PE on lovenox , HTN, GERD, L nephrostomy tube, HLD, acute BLE DVT, acute massive PE, anemia, ARF, CHF, covid-19, DM, endometrial CA, GAVE, obesity    PT Comments  Patient seen for PT session focused on ambulation and transfers. Patient required minA for transfers and ambulation and used RW; able to ambulate 35 feet in room. Tolerated session well with no signs of exertion or distress. Main limiting factors today were fatigue. Interventions aimed at improving functional activity tolerance. Continued skilled PT recommended to progress toward functional goals and support discharge readiness.    If plan is discharge home, recommend the following: A little help with walking and/or transfers;A little help with bathing/dressing/bathroom;Assistance with cooking/housework;Assist for transportation;Help with stairs or ramp for entrance   Can travel by private vehicle        Equipment Recommendations  None recommended by PT    Recommendations for Other Services       Precautions / Restrictions Precautions Precautions: Fall Precaution/Restrictions Comments: L nephrostomy tube     Mobility  Bed Mobility Overal bed mobility: Needs Assistance Bed Mobility: Supine to Sit     Supine to sit: Min assist     General bed mobility comments: decreased movement speed; vc and physical assistance to exit and enter bed; decline head of bed for positioning    Transfers Overall transfer level: Needs assistance Equipment used: Rolling walker (2 wheels) Transfers: Sit to/from Stand Sit to Stand: Min assist           General transfer comment: sit>stand from bed    Ambulation/Gait Ambulation/Gait  assistance: Contact guard assist Gait Distance (Feet): 35 Feet Assistive device: Rolling walker (2 wheels) Gait Pattern/deviations: Step-through pattern, Narrow base of support Gait velocity: decreased     General Gait Details: Slow cautious gait however no LOB; significant trunk flexion decrease stride length; OOB after returning to bed after ambulatio   Stairs             Wheelchair Mobility     Tilt Bed    Modified Rankin (Stroke Patients Only)       Balance Overall balance assessment: Needs assistance Sitting-balance support: No upper extremity supported, Feet supported Sitting balance-Leahy Scale: Good     Standing balance support: Bilateral upper extremity supported, During functional activity Standing balance-Leahy Scale: Fair Standing balance comment: reliant on BUE support                            Communication Communication Communication: No apparent difficulties  Cognition Arousal: Alert Behavior During Therapy: WFL for tasks assessed/performed   PT - Cognitive impairments: No apparent impairments                       PT - Cognition Comments: pleasant Following commands: Intact      Cueing Cueing Techniques: Verbal cues  Exercises      General Comments        Pertinent Vitals/Pain Pain Assessment Pain Assessment: No/denies pain    Home Living  Prior Function            PT Goals (current goals can now be found in the care plan section) Acute Rehab PT Goals Patient Stated Goal: get better    Frequency    Min 2X/week      PT Plan      Co-evaluation              AM-PAC PT 6 Clicks Mobility   Outcome Measure  Help needed turning from your back to your side while in a flat bed without using bedrails?: A Little Help needed moving from lying on your back to sitting on the side of a flat bed without using bedrails?: A Little Help needed moving to and from a  bed to a chair (including a wheelchair)?: A Little Help needed standing up from a chair using your arms (e.g., wheelchair or bedside chair)?: A Little Help needed to walk in hospital room?: A Little Help needed climbing 3-5 steps with a railing? : A Lot 6 Click Score: 17    End of Session Equipment Utilized During Treatment: Gait belt Activity Tolerance: Patient limited by fatigue Patient left: in chair;with call bell/phone within reach;with chair alarm set Nurse Communication: Mobility status PT Visit Diagnosis: Muscle weakness (generalized) (M62.81);Other abnormalities of gait and mobility (R26.89);Difficulty in walking, not elsewhere classified (R26.2)     Time: 9046-8992 PT Time Calculation (min) (ACUTE ONLY): 14 min  Charges:    $Therapeutic Activity: 8-22 mins PT General Charges $$ ACUTE PT VISIT: 1 Visit                     Sherlean Lesches DPT, PT     Sherlean A Dametri Ozburn 04/12/2024, 10:14 AM

## 2024-04-12 NOTE — Progress Notes (Signed)
 Date of Admission:  04/06/2024      ID: Courtney Mack is a 68 y.o. female Principal Problem:   UTI due to extended-spectrum beta lactamase (ESBL) producing Escherichia coli Active Problems:   Obesity, Class III, BMI 40-49.9 (morbid obesity) (HCC)   Dyslipidemia   IDA (iron  deficiency anemia)   AKI (acute kidney injury)   Hypotension   Generalized weakness   Nephrostomy status (HCC)   Chronic obstructive pulmonary disease (COPD) (HCC)   Iron  deficiency anemia   Hypertension    Subjective: Patient is doing well Has PICC on the left arm No flank pain No dysuria  Medications:   vitamin C   1,000 mg Oral Daily   enoxaparin   30 mg Subcutaneous Q12H   ferrous sulfate   325 mg Oral Q breakfast   fluticasone  furoate-vilanterol  1 puff Inhalation Daily   pantoprazole   40 mg Oral BID   rosuvastatin   10 mg Oral QHS   cyanocobalamin   100 mcg Oral Daily    Objective: Vital signs in last 24 hours: Patient Vitals for the past 24 hrs:  BP Temp Temp src Pulse Resp SpO2  04/12/24 1100 113/65 -- -- -- -- 97 %  04/12/24 1057 130/65 98.3 F (36.8 C) Oral 84 15 97 %  04/12/24 0814 (!) 98/50 97.9 F (36.6 C) -- 86 17 95 %  04/12/24 0507 (!) 124/58 98.7 F (37.1 C) Oral 84 20 97 %  04/12/24 0017 (!) 108/50 98.7 F (37.1 C) Oral 85 20 95 %  04/11/24 2040 (!) 110/52 98.4 F (36.9 C) -- 84 19 97 %  04/11/24 1500 (!) 106/52 97.7 F (36.5 C) Oral 84 16 97 %       PHYSICAL EXAM:  General: Alert, cooperative, no distress, appears stated age.  Lungs: Clear to auscultation bilaterally. No Wheezing or Rhonchi. No rales. Heart: Regular rate and rhythm, no murmur, rub or gallop. Abdomen: Soft, non-tender,not distended. Bowel sounds normal. No masses Left nephrostomy catheter Extremities: atraumatic, no cyanosis. No edema. No clubbing Skin: No rashes or lesions. Or bruising Lymph: Cervical, supraclavicular normal. Neurologic: Grossly non-focal  Lab Results    Latest Ref Rng &  Units 04/10/2024    4:40 AM 04/08/2024    4:25 AM 04/07/2024    3:47 AM  CBC  WBC 4.0 - 10.5 K/uL  4.5  4.1   Hemoglobin 12.0 - 15.0 g/dL 8.3  7.8  7.8   Hematocrit 36.0 - 46.0 %  27.6  26.4   Platelets 150 - 400 K/uL  299  274        Latest Ref Rng & Units 04/11/2024    3:07 AM 04/10/2024    4:40 AM 04/09/2024    3:18 AM  CMP  Glucose 70 - 99 mg/dL 893  895  882   BUN 8 - 23 mg/dL 13  14  13    Creatinine 0.44 - 1.00 mg/dL 9.22  9.16  8.76   Sodium 135 - 145 mmol/L 139  140  140   Potassium 3.5 - 5.1 mmol/L 3.9  4.2  3.3   Chloride 98 - 111 mmol/L 109  108  106   CO2 22 - 32 mmol/L 22  21  25    Calcium  8.9 - 10.3 mg/dL 8.5  8.5  8.6       Microbiology: 04/06/2024 urine culture ESBL E. coli Studies/Results: No results found.   Assessment/Plan: History of left hydronephrosis and has had percutaneous nephrostomy with internal stents since 2023 Complicated urinary  tract infection of the left pelvis, ureter and and bladder due to ESBL E. coli Patient has a nephrostomy tube also and internal stent with pigtail the nephrostomy tube was recently changed a month ago Patient needs at least 10 days of IV carbapenem Today she is day 6 She could get either IV  ertapenem at home or come to the day surgery to get it every day.  TOC working on it    AKI solved     Anemia Obesity   COPD   Generalized weakness   History of endometrial CA status post radiation   History of radiation cystitis   This consult involved complicated antimicrobial management     OPAT Diagnosis: ESBL ecoli complicated UTI   Baseline Creatinine 1.25       Allergies  No Known Allergies     OPAT Orders Ertapenem 1 gram Iv every 24 hours Duration: 10 days End Date: 04/16/24   [facility] Care Per Protocol:   Labs weekly while on IV antibiotics: X__ CBC with differential   _X_ CMP     _X_ Please pull PIC at completion of IV antibiotics   Discussed the management with the patient and the ID  pharmacist

## 2024-04-12 NOTE — Progress Notes (Signed)
 PROGRESS NOTE    Courtney Mack  FMW:[id number] DOB: [birth year] DOA: 04/06/2024 PCP: [facility]  Chief Complaint  Patient presents with   left sided pain    Hospital Course:  Courtney Mack is a 68 year old female with history of DVT/PE on Lovenox , hypertension, GERD, left nephrostomy tube status, hyperlipidemia, who presents to the ED for chief concerns of dysuria for 2 days and subjective fever. Patient admitted for UTI with history of ESBL E. coli, hypotension, AKI   Subjective: Patient was examined at the bedside, reports feeling better today. Medically stable for discharge Plan is to discharge patient with home antibiotics, TOC setting up  Discussed with daughter Ronal Amble and discussed the need for home abx. Patient reports they are not comfortable doing home infusions Dicussed with TOC - Home abx vs daily antibiotics at the infusion center. Patient is open to going to SNF for abx if needed   Objective: Vitals:   04/12/24 0507 04/12/24 0814 04/12/24 1057 04/12/24 1100  BP: (!) 124/58 (!) 98/50 130/65 113/65  Pulse: 84 86 84   Resp: 20 17 15    Temp: 98.7 F (37.1 C) 97.9 F (36.6 C) 98.3 F (36.8 C)   TempSrc: Oral  Oral   SpO2: 97% 95% 97% 97%  Weight:      Height:        Intake/Output Summary (Last 24 hours) at 04/12/2024 1512 Last data filed at 04/12/2024 1022 Gross per 24 hour  Intake 720 ml  Output 750 ml  Net -30 ml   Filed Weights   04/06/24 0911  Weight: 90.7 kg    Examination: Constitutional: appears age-appropriate Respiratory: clear to auscultation bilaterally, no wheezing, no crackles Cardiovascular: Regular rate and rhythm, no murmurs / rubs / gallops Abdomen: Morbidly obese abdomen, non tender , BS +,  Nephrostomy tube in place Skin: no rashes, lesions, ulcers. No induration Neurologic: Sensation intact. Strength 5/5 in all 4.  Psychiatric: Normal judgment and insight. Alert and oriented x 3. Normal  mood  Assessment & Plan:  UTI due to extended-spectrum beta lactamase (ESBL) producing Escherichia coli CT left percutaneous nephrostomy and double-J ureteral stent in place.  No abscess/fluid collection.  Right renal 6 mm calculus located centrally in the right renal pelvis.  No findings of obstructive uropathy. Ucx ESBL E.coli, reviewed sensitivities Was on IV Meropenem , discharge on IV Ertapenem until 04/16/24 to complete 10 days Seen by ID, appreciate recs Discussed with Urology, no indication for nephrostomy tube exchange On Trimethoprim  for ppx at home  AKI - resolved S/p gentle IV fluids  Hypokalemia - resolved - Monitor and replete as needed   Iron  deficiency anemia Acute drop in Hb Resume home iron  supplementation with breakfast  Hb 9.2 -> 7.8 -> 8.3 - stable, s/p IV fluids Monitor Hb  Chronic diastolic CHF - Resume home lasix  10/05   Dyslipidemia Home rosuvastatin  10   Obesity, Class III, BMI 40-49.9 (morbid obesity)   Chronic obstructive pulmonary disease (COPD) (HCC) Not in acute exacerbation Home inhalers   Generalized weakness Fall precaution PT/OT rec Home PT - patient refused, states will follow up with her PCP TOC working on home infusion vs day center vs SNF  DVT prophylaxis: Lovenox  SQ   Code Status: Full Code Disposition:  Home with home health, Home abx infusion  Consultants:  Infectious disease  Procedures:  None  Antimicrobials:  Anti-infectives (From admission, onward)    Start     Dose/Rate Route Frequency Ordered Stop  04/10/24 2100  ertapenem (INVANZ) 1 g in sodium chloride  0.9 % 100 mL IVPB        1 g 200 mL/hr over 30 Minutes Intravenous Every 24 hours 04/10/24 1542 04/16/24 2359   04/10/24 1400  meropenem  (MERREM ) 1 g in sodium chloride  0.9 % 100 mL IVPB  Status:  Discontinued        1 g 200 mL/hr over 30 Minutes Intravenous Every 8 hours 04/10/24 0631 04/10/24 1542   04/09/24 2200  meropenem  (MERREM ) 1 g in sodium chloride   0.9 % 100 mL IVPB  Status:  Discontinued        1 g 200 mL/hr over 30 Minutes Intravenous Every 12 hours 04/09/24 0829 04/10/24 0632   04/07/24 2200  meropenem  (MERREM ) 1 g in sodium chloride  0.9 % 100 mL IVPB  Status:  Discontinued        1 g 200 mL/hr over 30 Minutes Intravenous Every 8 hours 04/07/24 1409 04/09/24 0829   04/07/24 1200  vancomycin  (VANCOREADY) IVPB 750 mg/150 mL  Status:  Discontinued        750 mg 150 mL/hr over 60 Minutes Intravenous Every 24 hours 04/07/24 0755 04/07/24 1404   04/06/24 1245  vancomycin  (VANCOREADY) IVPB 2000 mg/400 mL        2,000 mg 200 mL/hr over 120 Minutes Intravenous  Once 04/06/24 1146 04/06/24 1457   04/06/24 1200  meropenem  (MERREM ) 1 g in sodium chloride  0.9 % 100 mL IVPB  Status:  Discontinued        1 g 200 mL/hr over 30 Minutes Intravenous Every 12 hours 04/06/24 1146 04/07/24 1409   04/06/24 1153  vancomycin  variable dose per unstable renal function (pharmacist dosing)  Status:  Discontinued         Does not apply See admin instructions 04/06/24 1153 04/07/24 1151       Data Reviewed: I have personally reviewed following labs and imaging studies CBC: Recent Labs  Lab 04/06/24 0933 04/07/24 0347 04/08/24 0425 04/10/24 0440  WBC 5.0 4.1 4.5  --   NEUTROABS 3.5  --   --   --   HGB 9.2* 7.8* 7.8* 8.3*  HCT 31.7* 26.4* 27.6*  --   MCV 87.1 86.8 87.3  --   PLT 307 274 299  --    Basic Metabolic Panel: Recent Labs  Lab 04/07/24 0347 04/08/24 0425 04/09/24 0318 04/10/24 0440 04/11/24 0307  NA 139 139 140 140 139  K 3.9 3.6 3.3* 4.2 3.9  CL 115* 110 106 108 109  CO2 21* 23 25 21* 22  GLUCOSE 95 134* 117* 104* 106*  BUN 16 15 13 14 13   CREATININE 0.97 1.06* 1.23* 0.83 0.77  CALCIUM  7.9* 8.5* 8.6* 8.5* 8.5*  MG  --   --  1.9 2.1  --    GFR: Estimated Creatinine Clearance: 65.2 mL/min (by C-G formula based on SCr of 0.77 mg/dL). Liver Function Tests: Recent Labs  Lab 04/06/24 0933  AST 18  ALT 9  ALKPHOS 50   BILITOT 0.9  PROT 6.8  ALBUMIN 3.3*   CBG: No results for input(s): GLUCAP in the last 168 hours.  Recent Results (from the past 240 hours)  Urine Culture     Status: Abnormal   Collection Time: 04/06/24  9:06 AM   Specimen: Urine, Random  Result Value Ref Range Status   Specimen Description   Final    URINE, RANDOM Performed at [facility], [address] [place], [place]  [address]    Special Requests   Final    NONE Reflexed from [id number] Performed at [facility], [address] [place], [place] [address]    Culture (A)  Final    80,000 COLONIES/mL ESCHERICHIA COLI Confirmed Extended Spectrum Beta-Lactamase Producer (ESBL).  In bloodstream infections from ESBL organisms, carbapenems are preferred over piperacillin /tazobactam. They are shown to have a lower risk of mortality.    Report Status 04/08/2024 FINAL  Final   Organism ID, Bacteria ESCHERICHIA COLI (A)  Final      Susceptibility   Escherichia coli - MIC*    AMPICILLIN  >=32 RESISTANT Resistant     CEFAZOLIN  (URINE) Value in next row Resistant      >=32 RESISTANTThis is a modified FDA-approved test that has been validated and its performance characteristics determined by the reporting laboratory.  This laboratory is certified under the Clinical Laboratory Improvement Amendments CLIA as qualified to perform high complexity clinical laboratory testing.    CEFEPIME  Value in next row Resistant      >=32 RESISTANTThis is a modified FDA-approved test that has been validated and its performance characteristics determined by the reporting laboratory.  This laboratory is certified under the Clinical Laboratory Improvement Amendments CLIA as qualified to perform high complexity clinical laboratory testing.    ERTAPENEM Value in next row Sensitive      >=32 RESISTANTThis is a modified FDA-approved test that has been validated and its performance characteristics determined by the reporting laboratory.  This  laboratory is certified under the Clinical Laboratory Improvement Amendments CLIA as qualified to perform high complexity clinical laboratory testing.    CEFTRIAXONE  Value in next row Resistant      >=32 RESISTANTThis is a modified FDA-approved test that has been validated and its performance characteristics determined by the reporting laboratory.  This laboratory is certified under the Clinical Laboratory Improvement Amendments CLIA as qualified to perform high complexity clinical laboratory testing.    CIPROFLOXACIN Value in next row Resistant      >=32 RESISTANTThis is a modified FDA-approved test that has been validated and its performance characteristics determined by the reporting laboratory.  This laboratory is certified under the Clinical Laboratory Improvement Amendments CLIA as qualified to perform high complexity clinical laboratory testing.    GENTAMICIN  Value in next row Sensitive      >=32 RESISTANTThis is a modified FDA-approved test that has been validated and its performance characteristics determined by the reporting laboratory.  This laboratory is certified under the Clinical Laboratory Improvement Amendments CLIA as qualified to perform high complexity clinical laboratory testing.    NITROFURANTOIN Value in next row Sensitive      >=32 RESISTANTThis is a modified FDA-approved test that has been validated and its performance characteristics determined by the reporting laboratory.  This laboratory is certified under the Clinical Laboratory Improvement Amendments CLIA as qualified to perform high complexity clinical laboratory testing.    TRIMETH /SULFA  Value in next row Resistant      >=32 RESISTANTThis is a modified FDA-approved test that has been validated and its performance characteristics determined by the reporting laboratory.  This laboratory is certified under the Clinical Laboratory Improvement Amendments CLIA as qualified to perform high complexity clinical laboratory testing.     AMPICILLIN /SULBACTAM Value in next row Resistant      >=32 RESISTANTThis is a modified FDA-approved test that has been validated and its performance characteristics determined by the reporting laboratory.  This laboratory is certified under the Clinical Laboratory Improvement Amendments CLIA as qualified  to perform high complexity clinical laboratory testing.    PIP/TAZO Value in next row Sensitive      8 SENSITIVEThis is a modified FDA-approved test that has been validated and its performance characteristics determined by the reporting laboratory.  This laboratory is certified under the Clinical Laboratory Improvement Amendments CLIA as qualified to perform high complexity clinical laboratory testing.    MEROPENEM  Value in next row Sensitive      8 SENSITIVEThis is a modified FDA-approved test that has been validated and its performance characteristics determined by the reporting laboratory.  This laboratory is certified under the Clinical Laboratory Improvement Amendments CLIA as qualified to perform high complexity clinical laboratory testing.    * 80,000 COLONIES/mL ESCHERICHIA COLI     Radiology Studies: No results found.   Scheduled Meds:  vitamin C   1,000 mg Oral Daily   enoxaparin   30 mg Subcutaneous Q12H   ferrous sulfate   325 mg Oral Q breakfast   fluticasone  furoate-vilanterol  1 puff Inhalation Daily   pantoprazole   40 mg Oral BID   rosuvastatin   10 mg Oral QHS   cyanocobalamin   100 mcg Oral Daily   Continuous Infusions:  ertapenem 1 g (04/11/24 2204)     LOS: 6 days  MDM: Patient is high risk for one or more organ failure.  They necessitate ongoing hospitalization for continued IV therapies and subsequent lab monitoring. Total time spent interpreting labs and vitals, reviewing the medical record, coordinating care amongst consultants and care team members, directly assessing and discussing care with the patient and/or family: 35 min Laree Lock, MD [facility]  To contact the attending physician between 7A-7P please use Epic Chat. To contact the covering physician during after hours 7P-7A, please review Amion.  04/12/2024, 3:12 PM   *This document has been created with the assistance of dictation software. Please excuse typographical errors. *

## 2024-04-12 NOTE — Progress Notes (Signed)
 PHARMACY CONSULT NOTE FOR:  OUTPATIENT  PARENTERAL ANTIBIOTIC THERAPY (OPAT)  Indication: Complicated UTI with ESBL E coli  UTI Regimen: Ertapenem 1gm IV q24h End date: 04/16/2024  Fax weekly lab results  promptly to [phone] Call [phone] with any questions or critical values Please pull midline at completion of IV antibiotics  IV antibiotic discharge orders are pended. To discharging provider:  please sign these orders via discharge navigator,  Select New Orders & click on the button choice - Manage This Unsigned Work.     Thank you for allowing pharmacy to be a part of this patient's care.  Loman Logan, PharmD, BCPS, BCIDP Work Cell: [phone] 04/12/2024 9:25 AM

## 2024-04-13 DIAGNOSIS — Z1612 Extended spectrum beta lactamase (ESBL) resistance: Secondary | ICD-10-CM | POA: Diagnosis not present

## 2024-04-13 DIAGNOSIS — N390 Urinary tract infection, site not specified: Secondary | ICD-10-CM | POA: Diagnosis not present

## 2024-04-13 DIAGNOSIS — B9629 Other Escherichia coli [E. coli] as the cause of diseases classified elsewhere: Secondary | ICD-10-CM | POA: Diagnosis not present

## 2024-04-13 MED ORDER — LOPERAMIDE HCL 2 MG PO CAPS: 2.0000 mg | ORAL_CAPSULE | ORAL | Status: DC | PRN

## 2024-04-13 NOTE — TOC Progression Note (Signed)
 Transition of Care [facility] - Progression Note    Patient Details  Name: Courtney Mack MRN: [id number] Date of Birth: 07-Dec-1955  Transition of Care [facility]) CM/SW Contact  Alfonso Rummer, LCSW Phone Number: 04/13/2024, 9:32 AM  Clinical Narrative:       Expected Discharge Plan: Home/Self Care Barriers to Discharge: Continued Medical Work up   LCSW A. Rummer spk with pt daughter via phone and Holley Herring with [facility]. Pt daughter reports she is not comfortable learning how to assist pt home infusion. Pt daughter reports she does not have transportation to assist pt with outpatient infusion treatment. Holley Herring with [facility] reports pt has a 4.80 copay and family must attend education to learn how to assist with infusion which the family has refused at this time. LCSW A Jamesmichael Shadd was later advise pt is appealing her discharge.             Expected Discharge Plan and Services       Living arrangements for the past 2 months: Single Family Home                                       Social Drivers of Health (SDOH) Interventions SDOH Screenings   Food Insecurity: No Food Insecurity (04/06/2024)  Housing: Low Risk  (04/06/2024)  Transportation Needs: No Transportation Needs (04/06/2024)  Utilities: Not At Risk (04/06/2024)  Depression (PHQ2-9): Low Risk  (04/11/2022)  Social Connections: Unknown (04/06/2024)  Tobacco Use: Low Risk  (04/06/2024)    Readmission Risk Interventions    04/07/2024    3:24 PM 08/07/2023   10:26 AM 07/24/2023   11:14 AM  Readmission Risk Prevention Plan  Transportation Screening Complete  Complete  PCP or Specialist Appt within 3-5 Days Complete    Social Work Consult for Recovery Care Planning/Counseling Complete    Palliative Care Screening Not Applicable    Medication Review Oceanographer) Complete Complete Complete  PCP or Specialist appointment within 3-5 days of discharge  Complete Complete  SW Recovery  Care/Counseling Consult  Complete Complete  Palliative Care Screening  Not Applicable Not Applicable  Skilled Nursing Facility  Not Applicable Not Applicable

## 2024-04-13 NOTE — TOC Progression Note (Signed)
 Transition of Care [facility]) - Progression Note    Patient Details  Name: Courtney Mack MRN: [id number] Date of Birth: 1955/12/28  Transition [facility] Care [facility]) CM/SW Contact  Lauraine JAYSON Carpen, LCSW Phone Number: 04/13/2024, 3:53 PM  Clinical Narrative:  Patient appealed her discharge prior to the discharge order being entered. Appeal was denied and patient is aware. CSW provided her with the Detailed Notice of Discharge and HINN-12. She said her daughter still cannot manage her IV abx but that her grandson might be able to. She will call him later today. CSW offered to call him but patient declined.   Expected Discharge Plan: Home/Self Care Barriers to Discharge: Continued Medical Work up               Expected Discharge Plan and Services       Living arrangements for the past 2 months: Single Family Home Expected Discharge Date: 04/13/24                                     Social Drivers of Health (SDOH) Interventions SDOH Screenings   Food Insecurity: No Food Insecurity (04/06/2024)  Housing: Low Risk  (04/06/2024)  Transportation Needs: No Transportation Needs (04/06/2024)  Utilities: Not At Risk (04/06/2024)  Depression (PHQ2-9): Low Risk  (04/11/2022)  Social Connections: Unknown (04/06/2024)  Tobacco Use: Low Risk  (04/06/2024)    Readmission Risk Interventions    04/07/2024    3:24 PM 08/07/2023   10:26 AM 07/24/2023   11:14 AM  Readmission Risk Prevention Plan  Transportation Screening Complete  Complete  PCP or Specialist Appt within 3-5 Days Complete    Social Work Consult for Recovery Care Planning/Counseling Complete    Palliative Care Screening Not Applicable    Medication Review Oceanographer) Complete Complete Complete  PCP or Specialist appointment within 3-5 days of discharge  Complete Complete  SW Recovery Care/Counseling Consult  Complete Complete  Palliative Care Screening  Not Applicable Not Applicable  Skilled Nursing Facility  Not  Applicable Not Applicable

## 2024-04-13 NOTE — Progress Notes (Signed)
 Occupational Therapy Treatment Patient Details Name: Courtney Mack MRN: [id number] DOB: 06/26/1956 Today's Date: 04/13/2024   History of present illness Pt is a 68 y/o F admitted on 04/06/24 after presenting with c/o L sided pain & dysuria. Pt is being treated for UTI 2/2 ESBL producing E. Coli. PMH: DVT/PE on lovenox , HTN, GERD, L nephrostomy tube, HLD, acute BLE DVT, acute massive PE, anemia, ARF, CHF, covid-19, DM, endometrial CA, GAVE, obesity   OT comments  Pt is seated in recliner on arrival. Pleasant and agreeable to OT session. She reports mild bilat knee pain. Pt performed STS from recliner to RW with CGA and demo ADL transfer ~8-10 ft to perform standing grooming tasks at sink in bathroom with SBA and no LOB. She required Mod A for LB dressing to doff/donn pull-up as she typically uses her cane at home to do this.  Mod A for BLE management to return to supine (typically sleeps in a recliner at home). Pt returned to bed with all needs in place and will cont to require skilled acute OT services to maximize her safety and IND to return to PLOF.       If plan is discharge home, recommend the following:  A little help with walking and/or transfers;A little help with bathing/dressing/bathroom;Direct supervision/assist for medications management   Equipment Recommendations  None recommended by OT    Recommendations for Other Services      Precautions / Restrictions Precautions Precautions: Fall Recall of Precautions/Restrictions: Intact Precaution/Restrictions Comments: L nephrostomy tube Restrictions Weight Bearing Restrictions Per Provider Order: No       Mobility Bed Mobility Overal bed mobility: Needs Assistance Bed Mobility: Sit to Supine       Sit to supine: Min assist, Mod assist   General bed mobility comments: min/Mod A for BLE management to return to supine; able to scoot to [facility] with bed placed in trendelenburg and use of headboard    Transfers Overall  transfer level: Needs assistance Equipment used: Rolling walker (2 wheels) Transfers: Sit to/from Stand Sit to Stand: Min assist, Contact guard assist           General transfer comment: from recliner; ambulated to bathroom and able to stand to perform grooming tasks with SBA before ambulating back to bed     Balance Overall balance assessment: Needs assistance Sitting-balance support: No upper extremity supported, Feet supported Sitting balance-Leahy Scale: Good     Standing balance support: During functional activity, Single extremity supported Standing balance-Leahy Scale: Fair Standing balance comment: no LOB while standing grooming performed with unilateral support on sink                           ADL either performed or assessed with clinical judgement   ADL Overall ADL's : Needs assistance/impaired     Grooming: Wash/dry face;Oral care;Standing;Supervision/safety Grooming Details (indicate cue type and reason): at sink in bathroom             Lower Body Dressing: Sitting/lateral leans;Sit to/from stand;Moderate assistance Lower Body Dressing Details (indicate cue type and reason): to doff/donn pull up, but typically uses a cane at home to do this             Functional mobility during ADLs: Contact guard assist;Rolling walker (2 wheels)      Extremity/Trunk Assessment              Vision       Perception  Praxis     Communication Communication Communication: No apparent difficulties   Cognition Arousal: Alert Behavior During Therapy: WFL for tasks assessed/performed                                 Following commands: Intact        Cueing   Cueing Techniques: Verbal cues  Exercises      Shoulder Instructions       General Comments      Pertinent Vitals/ Pain       Pain Assessment Pain Assessment: Faces Faces Pain Scale: Hurts a little bit Pain Location: bilat knees Pain Descriptors / Indicators:  Discomfort Pain Intervention(s): Monitored during session, Repositioned  Home Living                                          Prior Functioning/Environment              Frequency  Min 2X/week        Progress Toward Goals  OT Goals(current goals can now be found in the care plan section)  Progress towards OT goals: Progressing toward goals  Acute Rehab OT Goals Patient Stated Goal: return home OT Goal Formulation: With patient Time For Goal Achievement: 04/21/24 Potential to Achieve Goals: Good  Plan      Co-evaluation                 AM-PAC OT 6 Clicks Daily Activity     Outcome Measure   Help from another person eating meals?: None Help from another person taking care of personal grooming?: None Help from another person toileting, which includes using toliet, bedpan, or urinal?: A Little Help from another person bathing (including washing, rinsing, drying)?: A Little Help from another person to put on and taking off regular upper body clothing?: A Little Help from another person to put on and taking off regular lower body clothing?: A Little 6 Click Score: 20    End of Session Equipment Utilized During Treatment: Rolling walker (2 wheels)  OT Visit Diagnosis: Other abnormalities of gait and mobility (R26.89)   Activity Tolerance Patient tolerated treatment well   Patient Left with call bell/phone within reach;in bed;with bed alarm set   Nurse Communication Mobility status        Time: 8594-8578 OT Time Calculation (min): 16 min  Charges: OT General Charges $OT Visit: 1 Visit OT Treatments $Self Care/Home Management : 8-22 mins  Nafis Farnan, OTR/L  04/13/24, 4:36 PM   Alma Mohiuddin E Anora Schwenke 04/13/2024, 4:34 PM

## 2024-04-13 NOTE — Progress Notes (Signed)
 Mobility Specialist - Progress Note   04/13/24 1057  Mobility  Activity Ambulated with assistance  Level of Assistance Standby assist, set-up cues, supervision of patient - no hands on  Assistive Device Front wheel walker  Distance Ambulated (ft) 40 ft  Range of Motion/Exercises Active  Activity Response Tolerated well  Mobility visit 1 Mobility  Mobility Specialist Start Time (ACUTE ONLY) 1038  Mobility Specialist Stop Time (ACUTE ONLY) 1050  Mobility Specialist Time Calculation (min) (ACUTE ONLY) 12 min   Pt was in the recliner upon entry. Pt agreed to mobility earlier. Pt was able to STS with CGA and a 2 WW. Pt was able to ambulate well with 2 WW. Pt did state that knee and hip have general weakness. Pt was able to ambulate laterally and back pedal with 2 WW. After activity pt was repositioned in the recliner with needs in reach and chair alarm on.  Clem Rodes Mobility Specialist 04/13/24, 11:01 AM

## 2024-04-13 NOTE — Progress Notes (Signed)
 PROGRESS NOTE    Courtney Mack  FMW:[id number] DOB: 1955-08-12 DOA: 04/06/2024 PCP: [facility]  Chief Complaint  Patient presents with   left sided pain    Hospital Course:  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 Patient admitted for UTI with history of ESBL E. coli, hypotension, AKI   Subjective: Patient was examined at the bedside, reports feeling better today. Medically stable for discharge Reports having 2 episodes of diarrhea, denies abdominal pain, watery diarrhea -will give Imodium  as needed Plan is to discharge patient with home antibiotics, TOC setting up plan  Dicussed with TOC - Home abx vs daily antibiotics at the infusion center. Patient is open to going to SNF for abx if needed Patient appealed discharge, was denied   Objective: Vitals:   04/13/24 0321 04/13/24 0731 04/13/24 1222 04/13/24 1640  BP: (!) 115/53 (!) 95/54 (!) 103/59 (!) 106/50  Pulse: 91 82 85 85  Resp: 18 16 17 15   Temp: 98.2 F (36.8 C) 98.1 F (36.7 C) 98.4 F (36.9 C) 98.3 F (36.8 C)  TempSrc:      SpO2: 98% 96% 97% 98%  Weight:      Height:        Intake/Output Summary (Last 24 hours) at 04/13/2024 1833 Last data filed at 04/13/2024 1300 Gross per 24 hour  Intake 240 ml  Output 900 ml  Net -660 ml   Filed Weights   04/06/24 0911  Weight: 90.7 kg    Examination: Constitutional: appears age-appropriate Respiratory: clear to auscultation bilaterally, no wheezing, no crackles Cardiovascular: Regular rate and rhythm, no murmurs / rubs / gallops Abdomen: Morbidly obese abdomen, non tender , BS +,  Nephrostomy tube in place Skin: no rashes, lesions, ulcers. No induration Neurologic: Sensation intact. Strength 5/5 in all 4.  Psychiatric: Normal judgment and  insight. Alert and oriented x 3. Normal mood  Assessment & Plan:  UTI due to extended-spectrum beta lactamase (ESBL) producing Escherichia coli CT left percutaneous nephrostomy and double-J ureteral stent in place.  No abscess/fluid collection.  Right renal 6 mm calculus located centrally in the right renal pelvis.  No findings of obstructive uropathy. Ucx ESBL E.coli, reviewed sensitivities Was on IV Meropenem , discharge on IV Ertapenem until 04/16/24 to complete 10 days Seen by ID, appreciate recs Discussed with Urology, no indication for nephrostomy tube exchange On Trimethoprim  for ppx at home  AKI - resolved S/p gentle IV fluids  Hypokalemia - resolved - Monitor and replete as needed   Iron  deficiency anemia Acute drop in Hb Resume home iron  supplementation with breakfast  Hb 9.2 -> 7.8 -> 8.3 - stable, s/p IV fluids Monitor Hb  Chronic diastolic CHF - Resume home lasix  10/05   Dyslipidemia Home rosuvastatin  10   Obesity, Class III, BMI 40-49.9 (morbid obesity)   Chronic obstructive pulmonary disease (COPD) (HCC) Not in acute exacerbation Home inhalers   Generalized weakness Fall precaution PT/OT rec Home PT - patient refused, states will follow up with her PCP TOC working on home infusion vs day center vs SNF  DVT prophylaxis: Lovenox  SQ   Code Status: Full Code Disposition:  Home with home health, Home abx infusion  Consultants:  Infectious disease  Procedures:  None  Antimicrobials:  Anti-infectives (From admission, onward)    Start  Dose/Rate Route Frequency Ordered Stop   04/10/24 2100  ertapenem [facility]) 1 g in sodium chloride  0.9 % 100 mL IVPB        1 g 200 mL/hr over 30 Minutes Intravenous Every 24 hours 04/10/24 1542 04/17/24 1759   04/10/24 1400  meropenem  (MERREM ) 1 g in sodium chloride  0.9 % 100 mL IVPB  Status:  Discontinued        1 g 200 mL/hr over 30 Minutes Intravenous Every 8 hours 04/10/24 0631 04/10/24 1542   04/09/24 2200   meropenem  (MERREM ) 1 g in sodium chloride  0.9 % 100 mL IVPB  Status:  Discontinued        1 g 200 mL/hr over 30 Minutes Intravenous Every 12 hours 04/09/24 0829 04/10/24 0632   04/07/24 2200  meropenem  (MERREM ) 1 g in sodium chloride  0.9 % 100 mL IVPB  Status:  Discontinued        1 g 200 mL/hr over 30 Minutes Intravenous Every 8 hours 04/07/24 1409 04/09/24 0829   04/07/24 1200  vancomycin  (VANCOREADY) IVPB 750 mg/150 mL  Status:  Discontinued        750 mg 150 mL/hr over 60 Minutes Intravenous Every 24 hours 04/07/24 0755 04/07/24 1404   04/06/24 1245  vancomycin  (VANCOREADY) IVPB 2000 mg/400 mL        2,000 mg 200 mL/hr over 120 Minutes Intravenous  Once 04/06/24 1146 04/06/24 1457   04/06/24 1200  meropenem  (MERREM ) 1 g in sodium chloride  0.9 % 100 mL IVPB  Status:  Discontinued        1 g 200 mL/hr over 30 Minutes Intravenous Every 12 hours 04/06/24 1146 04/07/24 1409   04/06/24 1153  vancomycin  variable dose per unstable renal function (pharmacist dosing)  Status:  Discontinued         Does not apply See admin instructions 04/06/24 1153 04/07/24 1151       Data Reviewed: I have personally reviewed following labs and imaging studies CBC: Recent Labs  Lab 04/07/24 0347 04/08/24 0425 04/10/24 0440  WBC 4.1 4.5  --   HGB 7.8* 7.8* 8.3*  HCT 26.4* 27.6*  --   MCV 86.8 87.3  --   PLT 274 299  --    Basic Metabolic Panel: Recent Labs  Lab 04/07/24 0347 04/08/24 0425 04/09/24 0318 04/10/24 0440 04/11/24 0307  NA 139 139 140 140 139  K 3.9 3.6 3.3* 4.2 3.9  CL 115* 110 106 108 109  CO2 21* 23 25 21* 22  GLUCOSE 95 134* 117* 104* 106*  BUN 16 15 13 14 13   CREATININE 0.97 1.06* 1.23* 0.83 0.77  CALCIUM  7.9* 8.5* 8.6* 8.5* 8.5*  MG  --   --  1.9 2.1  --    GFR: Estimated Creatinine Clearance: 65.2 mL/min (by C-G formula based on SCr of 0.77 mg/dL). Liver Function Tests: No results for input(s): AST, ALT, ALKPHOS, BILITOT, PROT, ALBUMIN in the last 168  hours.  CBG: No results for input(s): GLUCAP in the last 168 hours.  Recent Results (from the past 240 hours)  Urine Culture     Status: Abnormal   Collection Time: 04/06/24  9:06 AM   Specimen: Urine, Random  Result Value Ref Range Status   Specimen Description   Final    URINE, RANDOM Performed at [facility], [address] [place], [place] [address]    Special Requests   Final    NONE Reflexed from [id number] Performed at [facility], [address] [place], [place] [address]    Culture (A)  Final    80,000 COLONIES/mL ESCHERICHIA COLI Confirmed Extended Spectrum Beta-Lactamase Producer (ESBL).  In bloodstream infections from ESBL organisms, carbapenems are preferred over piperacillin /tazobactam. They are shown to have a lower risk of mortality.    Report Status 04/08/2024 FINAL  Final   Organism ID, Bacteria ESCHERICHIA COLI (A)  Final      Susceptibility   Escherichia coli - MIC*    AMPICILLIN  >=32 RESISTANT Resistant     CEFAZOLIN  (URINE) Value in next row Resistant      >=32 RESISTANTThis is a modified FDA-approved test that has been validated and its performance characteristics determined by the reporting laboratory.  This laboratory is certified under the Clinical Laboratory Improvement Amendments CLIA as qualified to perform high complexity clinical laboratory testing.    CEFEPIME  Value in next row Resistant      >=32 RESISTANTThis is a modified FDA-approved test that has been validated and its performance characteristics determined by the reporting laboratory.  This laboratory is certified under the Clinical Laboratory Improvement Amendments CLIA as qualified to perform high complexity clinical laboratory testing.    ERTAPENEM Value in next row Sensitive      >=32 RESISTANTThis is a modified FDA-approved test that has been validated and its performance characteristics determined by the reporting laboratory.  This laboratory is certified under the  Clinical Laboratory Improvement Amendments CLIA as qualified to perform high complexity clinical laboratory testing.    CEFTRIAXONE  Value in next row Resistant      >=32 RESISTANTThis is a modified FDA-approved test that has been validated and its performance characteristics determined by the reporting laboratory.  This laboratory is certified under the Clinical Laboratory Improvement Amendments CLIA as qualified to perform high complexity clinical laboratory testing.    CIPROFLOXACIN Value in next row Resistant      >=32 RESISTANTThis is a modified FDA-approved test that has been validated and its performance characteristics determined by the reporting laboratory.  This laboratory is certified under the Clinical Laboratory Improvement Amendments CLIA as qualified to perform high complexity clinical laboratory testing.    GENTAMICIN  Value in next row Sensitive      >=32 RESISTANTThis is a modified FDA-approved test that has been validated and its performance characteristics determined by the reporting laboratory.  This laboratory is certified under the Clinical Laboratory Improvement Amendments CLIA as qualified to perform high complexity clinical laboratory testing.    NITROFURANTOIN Value in next row Sensitive      >=32 RESISTANTThis is a modified FDA-approved test that has been validated and its performance characteristics determined by the reporting laboratory.  This laboratory is certified under the Clinical Laboratory Improvement Amendments CLIA as qualified to perform high complexity clinical laboratory testing.    TRIMETH /SULFA  Value in next row Resistant      >=32 RESISTANTThis is a modified FDA-approved test that has been validated and its performance characteristics determined by the reporting laboratory.  This laboratory is certified under the Clinical Laboratory Improvement Amendments CLIA as qualified to perform high complexity clinical laboratory testing.    AMPICILLIN /SULBACTAM Value in  next row Resistant      >=32 RESISTANTThis is a modified FDA-approved test that has been validated and its performance characteristics determined by the reporting laboratory.  This laboratory is certified under the Clinical Laboratory Improvement Amendments CLIA as qualified to perform high complexity clinical laboratory testing.    PIP/TAZO Value in next row Sensitive      8  SENSITIVEThis is a modified FDA-approved test that has been validated and its performance characteristics determined by the reporting laboratory.  This laboratory is certified under the Clinical Laboratory Improvement Amendments CLIA as qualified to perform high complexity clinical laboratory testing.    MEROPENEM  Value in next row Sensitive      8 SENSITIVEThis is a modified FDA-approved test that has been validated and its performance characteristics determined by the reporting laboratory.  This laboratory is certified under the Clinical Laboratory Improvement Amendments CLIA as qualified to perform high complexity clinical laboratory testing.    * 80,000 COLONIES/mL ESCHERICHIA COLI     Radiology Studies: No results found.   Scheduled Meds:  vitamin C   1,000 mg Oral Daily   enoxaparin   30 mg Subcutaneous Q12H   ferrous sulfate   325 mg Oral Q breakfast   fluticasone  furoate-vilanterol  1 puff Inhalation Daily   furosemide   20 mg Oral Daily   pantoprazole   40 mg Oral BID   rosuvastatin   10 mg Oral QHS   cyanocobalamin   100 mcg Oral Daily   Continuous Infusions:  ertapenem 1 g (04/13/24 1744)     LOS: 7 days  MDM: Patient is high risk for one or more organ failure.  They necessitate ongoing hospitalization for continued IV therapies and subsequent lab monitoring. Total time spent interpreting labs and vitals, reviewing the medical record, coordinating care amongst consultants and care team members, directly assessing and discussing care with the patient and/or family: 35 min Laree Lock, MD [facility]  To contact the attending physician between 7A-7P please use Epic Chat. To contact the covering physician during after hours 7P-7A, please review Amion.  04/13/2024, 6:33 PM   *This document has been created with the assistance of dictation software. Please excuse typographical errors. *

## 2024-04-14 ENCOUNTER — Telehealth (HOSPITAL_COMMUNITY): Payer: Self-pay | Admitting: Pharmacy Technician

## 2024-04-14 DIAGNOSIS — Z1612 Extended spectrum beta lactamase (ESBL) resistance: Secondary | ICD-10-CM | POA: Diagnosis not present

## 2024-04-14 DIAGNOSIS — N390 Urinary tract infection, site not specified: Secondary | ICD-10-CM | POA: Diagnosis not present

## 2024-04-14 DIAGNOSIS — B9629 Other Escherichia coli [E. coli] as the cause of diseases classified elsewhere: Secondary | ICD-10-CM | POA: Diagnosis not present

## 2024-04-14 NOTE — Progress Notes (Signed)
 Physical Therapy Treatment Patient Details Name: Courtney Mack MRN: [id number] DOB: 03/27/1956 Today's Date: 04/14/2024   History of Present Illness Pt is a 68 y/o F admitted on 04/06/24 after presenting with c/o L sided pain & dysuria. Pt is being treated for UTI 2/2 ESBL producing E. Coli. PMH: DVT/PE on lovenox , HTN, GERD, L nephrostomy tube, HLD, acute BLE DVT, acute massive PE, anemia, ARF, CHF, covid-19, DM, endometrial CA, GAVE, obesity    PT Comments  Patient seen for PT session focused on strengthening and  functional mobility in room. Patient required CGA for 40 feet in room mobility and used RW. Pt continues to demonstrate flexed posture during gait training; vc provided to improve postural alignment. Tolerated session well with no signs of exertion or distress. Vitals remained stable during activity. Main limiting factors today were pt fatigue during ambulation. Interventions aimed at improving functional tolerance for ambulation. Continued skilled PT recommended to progress toward functional goals and support discharge readiness.    If plan is discharge home, recommend the following: A little help with walking and/or transfers;A little help with bathing/dressing/bathroom;Assistance with cooking/housework;Assist for transportation;Help with stairs or ramp for entrance   Can travel by private vehicle        Equipment Recommendations  None recommended by PT    Recommendations for Other Services       Precautions / Restrictions Precautions Precautions: Fall Recall of Precautions/Restrictions: Intact Precaution/Restrictions Comments: L nephrostomy tube Restrictions Weight Bearing Restrictions Per Provider Order: No     Mobility  Bed Mobility Overal bed mobility: Needs Assistance Bed Mobility: Sit to Supine       Sit to supine: Min assist, Mod assist   General bed mobility comments: min/Mod A for BLE management to return to supine    Transfers Overall transfer  level: Needs assistance Equipment used: Rolling walker (2 wheels) Transfers: Sit to/from Stand Sit to Stand: Min assist, Contact guard assist                Ambulation/Gait Ambulation/Gait assistance: Contact guard assist Gait Distance (Feet): 40 Feet Assistive device: Rolling walker (2 wheels) Gait Pattern/deviations: Step-through pattern, Narrow base of support       General Gait Details: Slow cautious gait however no LOB; significant trunk flexion decrease stride length; OOB after returning to bed after ambulation   Stairs             Wheelchair Mobility     Tilt Bed    Modified Rankin (Stroke Patients Only)       Balance Overall balance assessment: Needs assistance Sitting-balance support: No upper extremity supported, Feet supported Sitting balance-Leahy Scale: Good     Standing balance support: During functional activity, Single extremity supported Standing balance-Leahy Scale: Fair Standing balance comment: mild LOB when reaching outside BOS                            Communication Communication Communication: No apparent difficulties  Cognition Arousal: Alert Behavior During Therapy: WFL for tasks assessed/performed                             Following commands: Intact      Cueing Cueing Techniques: Verbal cues  Exercises      General Comments        Pertinent Vitals/Pain Pain Assessment Pain Assessment: No/denies pain    Home Living  Prior Function            PT Goals (current goals can now be found in the care plan section) Acute Rehab PT Goals Patient Stated Goal: get better PT Goal Formulation: With patient Time For Goal Achievement: 04/21/24 Potential to Achieve Goals: Good Progress towards PT goals: Progressing toward goals    Frequency    Min 2X/week      PT Plan      Co-evaluation              AM-PAC PT 6 Clicks Mobility   Outcome  Measure  Help needed turning from your back to your side while in a flat bed without using bedrails?: A Little Help needed moving from lying on your back to sitting on the side of a flat bed without using bedrails?: A Little Help needed moving to and from a bed to a chair (including a wheelchair)?: A Little Help needed standing up from a chair using your arms (e.g., wheelchair or bedside chair)?: A Little Help needed to walk in hospital room?: A Little Help needed climbing 3-5 steps with a railing? : A Lot 6 Click Score: 17    End of Session Equipment Utilized During Treatment: Gait belt Activity Tolerance: Patient limited by fatigue Patient left: in chair;with call bell/phone within reach;with chair alarm set Nurse Communication: Mobility status PT Visit Diagnosis: Muscle weakness (generalized) (M62.81);Other abnormalities of gait and mobility (R26.89);Difficulty in walking, not elsewhere classified (R26.2)     Time: 8879-8860 PT Time Calculation (min) (ACUTE ONLY): 19 min  Charges:    $Therapeutic Activity: 8-22 mins PT General Charges $$ ACUTE PT VISIT: 1 Visit                     Sherlean Lesches DPT, PT     Sherlean A Jericka Kadar 04/14/2024, 11:49 AM

## 2024-04-14 NOTE — TOC Progression Note (Signed)
 Transition of Care [facility]) - Progression Note    Patient Details  Name: Courtney Mack MRN: [id number] Date of Birth: 1956/03/19  Transition of Care [facility] CM/SW Contact  Lauraine JAYSON Carpen, LCSW Phone Number: 04/14/2024, 8:59 AM  Clinical Narrative:   CSW received message from [facility] liaison late yesterday. Patient will have to either stay in the hospital or go to outpatient infusion clinic since she only has a couple of days left of the antibiotic. Team is aware.  Expected Discharge Plan: Home/Self Care Barriers to Discharge: Continued Medical Work up               Expected Discharge Plan and Services       Living arrangements for the past 2 months: Single Family Home Expected Discharge Date: 04/13/24                                     Social Drivers of Health (SDOH) Interventions SDOH Screenings   Food Insecurity: No Food Insecurity (04/06/2024)  Housing: Low Risk  (04/06/2024)  Transportation Needs: No Transportation Needs (04/06/2024)  Utilities: Not At Risk (04/06/2024)  Depression (PHQ2-9): Low Risk  (04/11/2022)  Social Connections: Unknown (04/06/2024)  Tobacco Use: Low Risk  (04/06/2024)    Readmission Risk Interventions    04/07/2024    3:24 PM 08/07/2023   10:26 AM 07/24/2023   11:14 AM  Readmission Risk Prevention Plan  Transportation Screening Complete  Complete  PCP or Specialist Appt within 3-5 Days Complete    Social Work Consult for Recovery Care Planning/Counseling Complete    Palliative Care Screening Not Applicable    Medication Review Oceanographer) Complete Complete Complete  PCP or Specialist appointment within 3-5 days of discharge  Complete Complete  SW Recovery Care/Counseling Consult  Complete Complete  Palliative Care Screening  Not Applicable Not Applicable  Skilled Nursing Facility  Not Applicable Not Applicable

## 2024-04-14 NOTE — Progress Notes (Signed)
 Mobility Specialist - Progress Note    04/14/24 1025  Mobility  Activity Ambulated with assistance  Level of Assistance Standby assist, set-up cues, supervision of patient - no hands on  Assistive Device Front wheel walker  Distance Ambulated (ft) 35 ft  Range of Motion/Exercises Active  Activity Response Tolerated fair  Mobility visit 1 Mobility  Mobility Specialist Start Time (ACUTE ONLY) [id number]  Mobility Specialist Stop Time (ACUTE ONLY) 0932  Mobility Specialist Time Calculation (min) (ACUTE ONLY) 26 min   Pt was supine In bed upon entry. Pt agreed to mobility. Pt states that her LE are feeling better for mobility. Pt was able to EOB independently using bed guards. Pt was able to STS with CGA minA with 2 WW. Pt was able to ambulate throughout the room and maneuver around obstacles well. Pt was able to march in place to replicate stairs. Pt did need recovery break for muscle exhaustion. After activity pt was placed in recliner with needs in reach and chair alarm on.  Clem Rodes Mobility Specialist 04/14/24, 10:31 AM

## 2024-04-14 NOTE — Telephone Encounter (Signed)
 Auth Submission: NO AUTH NEEDED Site of care: [facility] Payer: [employer] Dual Medication & CPT/J Code(s) submitted: G8664 Erpatenum (Invanz) Diagnosis Code: N39.0, B96.29, Z16.12 Route of submission (phone, fax, portal):  Phone # Fax # Auth type: Buy/Bill HB Units/visits requested: 1 GM X 2 DOSES Reference number:  Approval from: 04/14/2024  to 07/07/24       Dagoberto Armour, CPhT [facility] Phone: [phone] 04/14/2024

## 2024-04-14 NOTE — Discharge Summary (Signed)
 Physician Discharge Summary  Courtney Mack FMW:[id number] DOB: 04-19-1956 DOA: 04/06/2024  PCP: [facility]  Admit date: 04/06/2024 Discharge date: 04/14/2024  Admitted From: Home Disposition: Home  Recommendations for Outpatient Follow-up:  Follow up with PCP in 1-2 weeks   Home Health: Infusion as below Equipment/Devices: As below  Discharge Condition: Stable CODE STATUS: Full Diet recommendation: As tolerated  Brief/Interim Summary: Ms. [name] is a 68 year old female with history of DVT/PE on Lovenox , hypertension, GERD, left nephrostomy tube status, hyperlipidemia, who presents to the ED for chief concerns of dysuria for 2 days and subjective fever.  Patient found to have ESBL E. coli UTI hypotension and AKI which have improved drastically.  She continues to require IV antibiotics given sensitivities, plan for discharge home with continued daily infusion with meropenem , last dose planned 04/16/2024.  Appreciate assistance with infectious disease, pharmacy, urology.    Discharge Diagnoses:  Principal Problem:   UTI due to extended-spectrum beta lactamase (ESBL) producing Escherichia coli Active Problems:   AKI (acute kidney injury)   Hypotension   Nephrostomy status (HCC)   Hypertension   Obesity, Class III, BMI 40-49.9 (morbid obesity) (HCC)   Dyslipidemia   IDA (iron  deficiency anemia)   Iron  deficiency anemia   Generalized weakness   Chronic obstructive pulmonary disease (COPD) (HCC)    Discharge Instructions   Allergies as of 04/14/2024   No Known Allergies      Medication List     STOP taking these medications    fosfomycin 3 g Pack Commonly known as: MONUROL        TAKE these medications    acetaminophen  500 MG tablet Commonly known as: TYLENOL  Take 500 mg by mouth every 6 (six) hours as needed for mild pain or fever.   albuterol  108 (90 Base) MCG/ACT inhaler Commonly known as: VENTOLIN  HFA Inhale 2  puffs into the lungs every 6 (six) hours as needed for wheezing or shortness of breath. What changed: when to take this   Breo Ellipta  100-25 MCG/ACT Aepb Generic drug: fluticasone  furoate-vilanterol Inhale 1 puff into the lungs daily.   cyanocobalamin  100 MCG tablet Take 100 mcg by mouth daily.   enoxaparin  30 MG/0.3ML injection Commonly known as: LOVENOX  Inject 0.3 mLs (30 mg total) into the skin every 12 (twelve) hours.   FeroSul 325 (65 Fe) MG tablet Generic drug: ferrous sulfate  Take 325 mg by mouth daily with breakfast.   furosemide  20 MG tablet Commonly known as: LASIX  Take 20 mg by mouth daily.   loperamide  2 MG capsule Commonly known as: IMODIUM  Take 2 mg by mouth as needed for diarrhea or loose stools.   multivitamin with minerals Tabs tablet Take 1 tablet by mouth daily.   pantoprazole  40 MG tablet Commonly known as: PROTONIX  Take 40 mg by mouth 2 (two) times daily.   rosuvastatin  10 MG tablet Commonly known as: CRESTOR  TAKE 1 TABLET(10 MG) BY MOUTH DAILY   simethicone  80 MG chewable tablet Commonly known as: MYLICON Chew 1 tablet (80 mg total) by mouth every 6 (six) hours as needed for flatulence.   trimethoprim  100 MG tablet Commonly known as: TRIMPEX  Take 1 tablet (100 mg total) by mouth at bedtime.   vitamin C  1000 MG tablet Take 1,000 mg by mouth daily.        No Known Allergies  Consultations: Infectious disease, urology  Procedures/Studies: DG Chest 1 View Result Date: 04/07/2024 EXAM: 1 VIEW(S) XRAY OF THE CHEST 04/07/2024 07:24:12 PM COMPARISON: 08/27/2023 CLINICAL  HISTORY: Pulmonary edema FINDINGS: LUNGS AND PLEURA: Low lung volumes. No focal pulmonary opacity. No pulmonary edema. No pleural effusion. No pneumothorax. HEART AND MEDIASTINUM: Aortic atherosclerosis. No acute abnormality of the cardiac and mediastinal silhouettes. BONES AND SOFT TISSUES: No acute osseous abnormality. IMPRESSION: 1. No acute abnormalities. Electronically  signed by: Norman Gatlin MD 04/07/2024 09:29 PM EDT RP Workstation: [id number]   CT ABDOMEN PELVIS W CONTRAST Result Date: 04/06/2024 CLINICAL DATA:  68 year old female with left flank, back, abdominal pain. Nephrostomy tube with purulent drainage, erythema. EXAM: CT ABDOMEN AND PELVIS WITH CONTRAST TECHNIQUE: Multidetector CT imaging of the abdomen and pelvis was performed using the standard protocol following bolus administration of intravenous contrast. RADIATION DOSE REDUCTION: This exam was performed according to the departmental dose-optimization program which includes automated exposure control, adjustment of the mA and/or kV according to patient size and/or use of iterative reconstruction technique. CONTRAST:  OMNIPAQUE  IOHEXOL  300 MG/ML  SOLN COMPARISON:  CT Abdomen and Pelvis 07/24/2023. Report of nephrostomy catheter exchange 03/05/2024. FINDINGS: Lower chest: Stable elevation of the right hemidiaphragm, probable normal variant. Calcified coronary artery atherosclerosis. Heart size remains normal. No pericardial or pleural effusion. Lung base atelectasis and scarring is stable, maximal in the lingula and the right lower lobe. Hepatobiliary: Negative liver and gallbladder. Pancreas: Negative. Spleen: Stable, negative. Adrenals/Urinary Tract: Normal adrenal glands. Previously seen right renal lower pole calculus now located in the central renal pelvis and measures 6 mm. But no right hydronephrosis and renal contrast excretion appears maintained on the delayed images. Diminutive right ureter, negative to the bladder. Percutaneous left nephrostomy in place with superimposed left double-J ureteral stent. Catheters appear appropriately position. Moderate inflammatory stranding throughout the left renal pelvis, surrounding the left ureter, and surrounding the decompressed urinary bladder where the distal pigtail terminates. No gas or fluid tracking along the percutaneous catheter, the left flank body  wall appears stable. No left nephrogram striations to indicate overt pyelonephritis. No pararenal abscess or fluid. Stomach/Bowel: Mildly increased retained stool in the large bowel, otherwise stable bowel in the abdomen and pelvis. Appendix remains normal on series 3, image 65. No dilated bowel loops. Chronic rectus muscle diastasis, no bowel hernia. Decompressed stomach and duodenum. No pneumoperitoneum or free fluid. Vascular/Lymphatic: Aortoiliac calcified atherosclerosis. Major arterial structures in the abdomen and pelvis remain patent. Normal caliber abdominal aorta. IVC filter is stable. Portal venous system appears patent. Small but progressed left renal retroperitoneal lymph nodes series 3, image 46, up to 8 mm short axis now (previously 5 mm and smaller). Retroaortic left renal vein incidentally noted, patent. Reproductive: Stable, negative. Other: Chronic presacral stranding appears stable. No pelvic free fluid. Musculoskeletal: Lumbar spine facet arthropathy, some levels of facet ankylosis. Lower thoracic spine Diffuse idiopathic skeletal hyperostosis (DISH) and subsequent interbody ankylosis. Chronic severe hip joint degeneration, femoral head remodeling. No acute osseous abnormality identified. IMPRESSION: 1. Left percutaneous nephrostomy and double-J ureteral stent in place with satisfactory positioning. Left renal collecting system, renal hilum, left ureter, and bladder appear acutely inflamed. But no associated abscess, fluid collection, and no overt left pyelonephritis. Adjacent small but reactive appearing left retroperitoneal lymph nodes. 2. Right renal 6 mm calculus now located centrally in the right renal pelvis. But no findings of acute obstructive uropathy. 3. Otherwise stable since January CT appearance of the Abdomen and Pelvis. Aortic Atherosclerosis (ICD10-I70.0). Electronically Signed   By: VEAR Hurst M.D.   On: 04/06/2024 11:12     Subjective: No acute issues or events  overnight   Discharge  Exam: Vitals:   04/14/24 0728 04/14/24 1153  BP: (!) 122/47 (!) 112/45  Pulse: 82 89  Resp:    Temp: 98.4 F (36.9 C) 98.2 F (36.8 C)  SpO2: 97% 97%   Vitals:   04/13/24 2331 04/14/24 0317 04/14/24 0728 04/14/24 1153  BP: (!) 100/43 (!) 108/49 (!) 122/47 (!) 112/45  Pulse: 84 86 82 89  Resp: 20 20    Temp: 98.2 F (36.8 C) 98.1 F (36.7 C) 98.4 F (36.9 C) 98.2 F (36.8 C)  TempSrc:   Oral Oral  SpO2: 96% 97% 97% 97%  Weight:      Height:        General: Pt is alert, awake, not in acute distress Cardiovascular: RRR, S1/S2 +, no rubs, no gallops Respiratory: CTA bilaterally, no wheezing, no rhonchi Abdominal: Soft, NT, ND, bowel sounds + Extremities: no edema, no cyanosis    The results of significant diagnostics from this hospitalization (including imaging, microbiology, ancillary and laboratory) are listed below for reference.     Microbiology: Recent Results (from the past 240 hours)  Urine Culture     Status: Abnormal   Collection Time: 04/06/24  9:06 AM   Specimen: Urine, Random  Result Value Ref Range Status   Specimen Description   Final    URINE, RANDOM Performed at [facility], [address], [place], [place] [address]    Special Requests   Final    NONE Reflexed from [id number] Performed at [facility], [address] [place], [place] [address]    Culture (A)  Final    80,000 COLONIES/mL ESCHERICHIA COLI Confirmed Extended Spectrum Beta-Lactamase Producer (ESBL).  In bloodstream infections from ESBL organisms, carbapenems are preferred over piperacillin /tazobactam. They are shown to have a lower risk of mortality.    Report Status 04/08/2024 FINAL  Final   Organism ID, Bacteria ESCHERICHIA COLI (A)  Final      Susceptibility   Escherichia coli - MIC*    AMPICILLIN  >=32 RESISTANT Resistant     CEFAZOLIN  (URINE) Value in next row Resistant      >=32 RESISTANTThis is a modified FDA-approved test that has  been validated and its performance characteristics determined by the reporting laboratory.  This laboratory is certified under the Clinical Laboratory Improvement Amendments CLIA as qualified to perform high complexity clinical laboratory testing.    CEFEPIME  Value in next row Resistant      >=32 RESISTANTThis is a modified FDA-approved test that has been validated and its performance characteristics determined by the reporting laboratory.  This laboratory is certified under the Clinical Laboratory Improvement Amendments CLIA as qualified to perform high complexity clinical laboratory testing.    ERTAPENEM Value in next row Sensitive      >=32 RESISTANTThis is a modified FDA-approved test that has been validated and its performance characteristics determined by the reporting laboratory.  This laboratory is certified under the Clinical Laboratory Improvement Amendments CLIA as qualified to perform high complexity clinical laboratory testing.    CEFTRIAXONE  Value in next row Resistant      >=32 RESISTANTThis is a modified FDA-approved test that has been validated and its performance characteristics determined by the reporting laboratory.  This laboratory is certified under the Clinical Laboratory Improvement Amendments CLIA as qualified to perform high complexity clinical laboratory testing.    CIPROFLOXACIN Value in next row Resistant      >=32 RESISTANTThis is a modified FDA-approved test that has been validated and its performance characteristics determined by the  reporting laboratory.  This laboratory is certified under the Clinical Laboratory Improvement Amendments CLIA as qualified to perform high complexity clinical laboratory testing.    GENTAMICIN  Value in next row Sensitive      >=32 RESISTANTThis is a modified FDA-approved test that has been validated and its performance characteristics determined by the reporting laboratory.  This laboratory is certified under the Clinical Laboratory Improvement  Amendments CLIA as qualified to perform high complexity clinical laboratory testing.    NITROFURANTOIN Value in next row Sensitive      >=32 RESISTANTThis is a modified FDA-approved test that has been validated and its performance characteristics determined by the reporting laboratory.  This laboratory is certified under the Clinical Laboratory Improvement Amendments CLIA as qualified to perform high complexity clinical laboratory testing.    TRIMETH /SULFA  Value in next row Resistant      >=32 RESISTANTThis is a modified FDA-approved test that has been validated and its performance characteristics determined by the reporting laboratory.  This laboratory is certified under the Clinical Laboratory Improvement Amendments CLIA as qualified to perform high complexity clinical laboratory testing.    AMPICILLIN /SULBACTAM Value in next row Resistant      >=32 RESISTANTThis is a modified FDA-approved test that has been validated and its performance characteristics determined by the reporting laboratory.  This laboratory is certified under the Clinical Laboratory Improvement Amendments CLIA as qualified to perform high complexity clinical laboratory testing.    PIP/TAZO Value in next row Sensitive      8 SENSITIVEThis is a modified FDA-approved test that has been validated and its performance characteristics determined by the reporting laboratory.  This laboratory is certified under the Clinical Laboratory Improvement Amendments CLIA as qualified to perform high complexity clinical laboratory testing.    MEROPENEM  Value in next row Sensitive      8 SENSITIVEThis is a modified FDA-approved test that has been validated and its performance characteristics determined by the reporting laboratory.  This laboratory is certified under the Clinical Laboratory Improvement Amendments CLIA as qualified to perform high complexity clinical laboratory testing.    * 80,000 COLONIES/mL ESCHERICHIA COLI     Labs: BNP (last 3  results) Recent Labs    07/23/23 1910 08/04/23 1614 08/27/23 1820  BNP 35.0 85.4 59.2   Basic Metabolic Panel: Recent Labs  Lab 04/08/24 0425 04/09/24 0318 04/10/24 0440 04/11/24 0307  NA 139 140 140 139  K 3.6 3.3* 4.2 3.9  CL 110 106 108 109  CO2 23 25 21* 22  GLUCOSE 134* 117* 104* 106*  BUN 15 13 14 13   CREATININE 1.06* 1.23* 0.83 0.77  CALCIUM  8.5* 8.6* 8.5* 8.5*  MG  --  1.9 2.1  --    Liver Function Tests: No results for input(s): AST, ALT, ALKPHOS, BILITOT, PROT, ALBUMIN in the last 168 hours. No results for input(s): LIPASE, AMYLASE in the last 168 hours. No results for input(s): AMMONIA in the last 168 hours. CBC: Recent Labs  Lab 04/08/24 0425 04/10/24 0440  WBC 4.5  --   HGB 7.8* 8.3*  HCT 27.6*  --   MCV 87.3  --   PLT 299  --    Cardiac Enzymes: No results for input(s): CKTOTAL, CKMB, CKMBINDEX, TROPONINI in the last 168 hours. BNP: Invalid input(s): POCBNP CBG: No results for input(s): GLUCAP in the last 168 hours. D-Dimer No results for input(s): DDIMER in the last 72 hours. Hgb A1c No results for input(s): HGBA1C in the last 72 hours. Lipid Profile No results  for input(s): CHOL, HDL, LDLCALC, TRIG, CHOLHDL, LDLDIRECT in the last 72 hours. Thyroid function studies No results for input(s): TSH, T4TOTAL, T3FREE, THYROIDAB in the last 72 hours.  Invalid input(s): FREET3 Anemia work up No results for input(s): VITAMINB12, FOLATE, FERRITIN, TIBC, IRON , RETICCTPCT in the last 72 hours. Urinalysis    Component Value Date/Time   COLORURINE YELLOW (A) 04/06/2024 0906   APPEARANCEUR CLOUDY (A) 04/06/2024 0906   APPEARANCEUR Cloudy (A) 01/03/2022 1443   LABSPEC >1.046 (H) 04/06/2024 0906   PHURINE 7.0 04/06/2024 0906   GLUCOSEU NEGATIVE 04/06/2024 0906   HGBUR SMALL (A) 04/06/2024 0906   BILIRUBINUR NEGATIVE 04/06/2024 0906   BILIRUBINUR Negative 01/03/2022 1443   KETONESUR 5  (A) 04/06/2024 0906   PROTEINUR 100 (A) 04/06/2024 0906   NITRITE NEGATIVE 04/06/2024 0906   LEUKOCYTESUR LARGE (A) 04/06/2024 0906   Sepsis Labs Recent Labs  Lab 04/08/24 0425  WBC 4.5   Microbiology Recent Results (from the past 240 hours)  Urine Culture     Status: Abnormal   Collection Time: 04/06/24  9:06 AM   Specimen: Urine, Random  Result Value Ref Range Status   Specimen Description   Final    URINE, RANDOM Performed at [facility], [address], [place], [place] [address]    Special Requests   Final    NONE Reflexed from [id number] Performed at [facility], [address] [place], [place] [address]    Culture (A)  Final    80,000 COLONIES/mL ESCHERICHIA COLI Confirmed Extended Spectrum Beta-Lactamase Producer (ESBL).  In bloodstream infections from ESBL organisms, carbapenems are preferred over piperacillin /tazobactam. They are shown to have a lower risk of mortality.    Report Status 04/08/2024 FINAL  Final   Organism ID, Bacteria ESCHERICHIA COLI (A)  Final      Susceptibility   Escherichia coli - MIC*    AMPICILLIN  >=32 RESISTANT Resistant     CEFAZOLIN  (URINE) Value in next row Resistant      >=32 RESISTANTThis is a modified FDA-approved test that has been validated and its performance characteristics determined by the reporting laboratory.  This laboratory is certified under the Clinical Laboratory Improvement Amendments CLIA as qualified to perform high complexity clinical laboratory testing.    CEFEPIME  Value in next row Resistant      >=32 RESISTANTThis is a modified FDA-approved test that has been validated and its performance characteristics determined by the reporting laboratory.  This laboratory is certified under the Clinical Laboratory Improvement Amendments CLIA as qualified to perform high complexity clinical laboratory testing.    ERTAPENEM Value in next row Sensitive      >=32 RESISTANTThis is a modified FDA-approved test that  has been validated and its performance characteristics determined by the reporting laboratory.  This laboratory is certified under the Clinical Laboratory Improvement Amendments CLIA as qualified to perform high complexity clinical laboratory testing.    CEFTRIAXONE  Value in next row Resistant      >=32 RESISTANTThis is a modified FDA-approved test that has been validated and its performance characteristics determined by the reporting laboratory.  This laboratory is certified under the Clinical Laboratory Improvement Amendments CLIA as qualified to perform high complexity clinical laboratory testing.    CIPROFLOXACIN Value in next row Resistant      >=32 RESISTANTThis is a modified FDA-approved test that has been validated and its performance characteristics determined by the reporting laboratory.  This laboratory is certified under the Clinical Laboratory Improvement Amendments CLIA as qualified to perform  high complexity clinical laboratory testing.    GENTAMICIN  Value in next row Sensitive      >=32 RESISTANTThis is a modified FDA-approved test that has been validated and its performance characteristics determined by the reporting laboratory.  This laboratory is certified under the Clinical Laboratory Improvement Amendments CLIA as qualified to perform high complexity clinical laboratory testing.    NITROFURANTOIN Value in next row Sensitive      >=32 RESISTANTThis is a modified FDA-approved test that has been validated and its performance characteristics determined by the reporting laboratory.  This laboratory is certified under the Clinical Laboratory Improvement Amendments CLIA as qualified to perform high complexity clinical laboratory testing.    TRIMETH /SULFA  Value in next row Resistant      >=32 RESISTANTThis is a modified FDA-approved test that has been validated and its performance characteristics determined by the reporting laboratory.  This laboratory is certified under the Clinical  Laboratory Improvement Amendments CLIA as qualified to perform high complexity clinical laboratory testing.    AMPICILLIN /SULBACTAM Value in next row Resistant      >=32 RESISTANTThis is a modified FDA-approved test that has been validated and its performance characteristics determined by the reporting laboratory.  This laboratory is certified under the Clinical Laboratory Improvement Amendments CLIA as qualified to perform high complexity clinical laboratory testing.    PIP/TAZO Value in next row Sensitive      8 SENSITIVEThis is a modified FDA-approved test that has been validated and its performance characteristics determined by the reporting laboratory.  This laboratory is certified under the Clinical Laboratory Improvement Amendments CLIA as qualified to perform high complexity clinical laboratory testing.    MEROPENEM  Value in next row Sensitive      8 SENSITIVEThis is a modified FDA-approved test that has been validated and its performance characteristics determined by the reporting laboratory.  This laboratory is certified under the Clinical Laboratory Improvement Amendments CLIA as qualified to perform high complexity clinical laboratory testing.    * 80,000 COLONIES/mL ESCHERICHIA COLI     Time coordinating discharge: Over 30 minutes  SIGNED:   Elsie JAYSON Montclair, DO [facility] 04/14/2024, 3:21 PM Pager   If 7PM-7AM, please contact night-coverage [email or web address]

## 2024-04-15 ENCOUNTER — Ambulatory Visit
Admission: RE | Admit: 2024-04-15 | Discharge: 2024-04-15 | Disposition: A | Discharge status: Home / Self Care | Source: Ambulatory Visit | Attending: Infectious Diseases | Admitting: Infectious Diseases

## 2024-04-15 VITALS — BP 112/50 | HR 89 | Temp 97.2°F

## 2024-04-15 DIAGNOSIS — B9629 Other Escherichia coli [E. coli] as the cause of diseases classified elsewhere: Secondary | ICD-10-CM | POA: Diagnosis present

## 2024-04-15 DIAGNOSIS — N390 Urinary tract infection, site not specified: Secondary | ICD-10-CM | POA: Insufficient documentation

## 2024-04-15 DIAGNOSIS — Z1612 Extended spectrum beta lactamase (ESBL) resistance: Secondary | ICD-10-CM | POA: Insufficient documentation

## 2024-04-15 MED ORDER — HEPARIN SOD (PORK) LOCK FLUSH 100 UNIT/ML IV SOLN
250.0000 [IU] | INTRAVENOUS | Status: AC | PRN
  Administered 2024-04-15: 250 [IU]

## 2024-04-15 MED ORDER — SODIUM CHLORIDE 0.9 % IV SOLN
1.0000 g | Freq: Once | INTRAVENOUS | Status: AC
  Administered 2024-04-15: 1 g via INTRAVENOUS
  Filled 2024-04-15: qty 1000

## 2024-04-15 MED ORDER — HEPARIN SOD (PORK) LOCK FLUSH 100 UNIT/ML IV SOLN
INTRAVENOUS | Status: AC
  Filled 2024-04-15: qty 5

## 2024-04-15 NOTE — Progress Notes (Signed)
 Pharmacy - Antimicrobial Stewardship  **Late entry**  Assisted with scheduling outpatient ertapenem infusion once transportation was confirmed.  Reached out the pharmacy outpatient infusion team and to [facility].  Appointment set up fro 10/9 and 10/10 at 2p (request was for appt to be after 1pm).  Orders were entered into the antibiotic treatment plan. Patient has midline which is to be removed upon completion of antibiotic infusion on 10/10.  Patient was made aware of time and where to go upon arrival.     Nahia Nissan, PharmD, BCPS, [place] Work Cell: [phone] 04/15/2024 8:24 AM

## 2024-04-16 ENCOUNTER — Observation Stay
Admission: EM | Admit: 2024-04-16 | Discharge: 2024-04-20 | Disposition: A | Discharge status: Home / Self Care | Attending: Hospitalist | Admitting: Hospitalist

## 2024-04-16 ENCOUNTER — Emergency Department

## 2024-04-16 ENCOUNTER — Other Ambulatory Visit: Payer: Self-pay

## 2024-04-16 ENCOUNTER — Inpatient Hospital Stay: Admit: 2024-04-16 | Discharge: 2024-04-16 | Disposition: A | Discharge status: Home / Self Care

## 2024-04-16 DIAGNOSIS — R531 Weakness: Secondary | ICD-10-CM

## 2024-04-16 DIAGNOSIS — I825Y1 Chronic embolism and thrombosis of unspecified deep veins of right proximal lower extremity: Secondary | ICD-10-CM | POA: Insufficient documentation

## 2024-04-16 DIAGNOSIS — K219 Gastro-esophageal reflux disease without esophagitis: Secondary | ICD-10-CM | POA: Diagnosis not present

## 2024-04-16 DIAGNOSIS — Z79899 Other long term (current) drug therapy: Secondary | ICD-10-CM | POA: Diagnosis not present

## 2024-04-16 DIAGNOSIS — E785 Hyperlipidemia, unspecified: Secondary | ICD-10-CM | POA: Diagnosis present

## 2024-04-16 DIAGNOSIS — R262 Difficulty in walking, not elsewhere classified: Secondary | ICD-10-CM | POA: Insufficient documentation

## 2024-04-16 DIAGNOSIS — J441 Chronic obstructive pulmonary disease with (acute) exacerbation: Principal | ICD-10-CM | POA: Insufficient documentation

## 2024-04-16 DIAGNOSIS — Z8542 Personal history of malignant neoplasm of other parts of uterus: Secondary | ICD-10-CM | POA: Diagnosis not present

## 2024-04-16 DIAGNOSIS — I5032 Chronic diastolic (congestive) heart failure: Secondary | ICD-10-CM | POA: Insufficient documentation

## 2024-04-16 DIAGNOSIS — R079 Chest pain, unspecified: Secondary | ICD-10-CM | POA: Diagnosis not present

## 2024-04-16 DIAGNOSIS — I110 Hypertensive heart disease with heart failure: Secondary | ICD-10-CM | POA: Insufficient documentation

## 2024-04-16 DIAGNOSIS — Z936 Other artificial openings of urinary tract status: Secondary | ICD-10-CM | POA: Diagnosis not present

## 2024-04-16 DIAGNOSIS — D509 Iron deficiency anemia, unspecified: Secondary | ICD-10-CM | POA: Insufficient documentation

## 2024-04-16 DIAGNOSIS — I10 Essential (primary) hypertension: Secondary | ICD-10-CM | POA: Diagnosis present

## 2024-04-16 DIAGNOSIS — Z1152 Encounter for screening for COVID-19: Secondary | ICD-10-CM | POA: Diagnosis not present

## 2024-04-16 DIAGNOSIS — R0602 Shortness of breath: Secondary | ICD-10-CM | POA: Diagnosis present

## 2024-04-16 DIAGNOSIS — Z6841 Body Mass Index (BMI) 40.0 and over, adult: Secondary | ICD-10-CM | POA: Insufficient documentation

## 2024-04-16 DIAGNOSIS — E66813 Obesity, class 3: Secondary | ICD-10-CM | POA: Diagnosis not present

## 2024-04-16 DIAGNOSIS — N390 Urinary tract infection, site not specified: Secondary | ICD-10-CM | POA: Diagnosis not present

## 2024-04-16 DIAGNOSIS — R8281 Pyuria: Secondary | ICD-10-CM | POA: Insufficient documentation

## 2024-04-16 DIAGNOSIS — R0902 Hypoxemia: Secondary | ICD-10-CM | POA: Diagnosis not present

## 2024-04-16 LAB — BASIC METABOLIC PANEL WITH GFR
Anion gap: 16 — ABNORMAL HIGH (ref 5–15)
BUN: 12 mg/dL (ref 8–23)
CO2: 23 mmol/L (ref 22–32)
Calcium: 8.5 mg/dL — ABNORMAL LOW (ref 8.9–10.3)
Chloride: 104 mmol/L (ref 98–111)
Creatinine, Ser: 0.87 mg/dL (ref 0.44–1.00)
GFR, Estimated: >60 mL/min (ref >60)
Glucose, Bld: 113 mg/dL — ABNORMAL HIGH (ref 70–99)
Potassium: 3.7 mmol/L (ref 3.5–5.1)
Sodium: 143 mmol/L (ref 135–145)

## 2024-04-16 LAB — URINALYSIS, ROUTINE W REFLEX MICROSCOPIC
Bacteria, UA: NONE SEEN
Bilirubin Urine: NEGATIVE
Glucose, UA: NEGATIVE mg/dL
Hgb urine dipstick: NEGATIVE
Ketones, ur: NEGATIVE mg/dL
Nitrite: NEGATIVE
Protein, ur: 100 mg/dL — AB
Specific Gravity, Urine: 1.018 (ref 1.005–1.030)
WBC, UA: >50 WBC/hpf (ref 0–5)
pH: 6 (ref 5.0–8.0)

## 2024-04-16 LAB — CBC
HCT: 29.7 % — ABNORMAL LOW (ref 36.0–46.0)
Hemoglobin: 8.7 g/dL — ABNORMAL LOW (ref 12.0–15.0)
MCH: 25.9 pg — ABNORMAL LOW (ref 26.0–34.0)
MCHC: 29.3 g/dL — ABNORMAL LOW (ref 30.0–36.0)
MCV: 88.4 fL (ref 80.0–100.0)
Platelets: 248 K/uL (ref 150–400)
RBC: 3.36 MIL/uL — ABNORMAL LOW (ref 3.87–5.11)
RDW: 18 % — ABNORMAL HIGH (ref 11.5–15.5)
WBC: 4.9 K/uL (ref 4.0–10.5)
nRBC: 0 % (ref 0.0–0.2)

## 2024-04-16 LAB — RESP PANEL BY RT-PCR (RSV, FLU A&B, COVID)  RVPGX2
Influenza A by PCR: NEGATIVE
Influenza B by PCR: NEGATIVE
Resp Syncytial Virus by PCR: NEGATIVE
SARS Coronavirus 2 by RT PCR: NEGATIVE

## 2024-04-16 LAB — TROPONIN I (HIGH SENSITIVITY)
Troponin I (High Sensitivity): 6 ng/L (ref <18)
Troponin I (High Sensitivity): 7 ng/L (ref <18)

## 2024-04-16 LAB — LACTIC ACID, PLASMA: Lactic Acid, Venous: 0.8 mmol/L (ref 0.5–1.9)

## 2024-04-16 LAB — BRAIN NATRIURETIC PEPTIDE: B Natriuretic Peptide: 54.2 pg/mL (ref 0.0–100.0)

## 2024-04-16 LAB — PROCALCITONIN: Procalcitonin: <0.1 ng/mL

## 2024-04-16 MED ORDER — ENOXAPARIN SODIUM 30 MG/0.3ML IJ SOSY
30.0000 mg | PREFILLED_SYRINGE | Freq: Two times a day (BID) | INTRAMUSCULAR | Status: DC
  Administered 2024-04-16 – 2024-04-20 (×8): 30 mg via SUBCUTANEOUS
  Filled 2024-04-16 (×8): qty 0.3

## 2024-04-16 MED ORDER — DOXYCYCLINE HYCLATE 100 MG PO TABS
100.0000 mg | ORAL_TABLET | Freq: Two times a day (BID) | ORAL | Status: DC
  Administered 2024-04-16 – 2024-04-17 (×2): 100 mg via ORAL
  Filled 2024-04-16 (×2): qty 1

## 2024-04-16 MED ORDER — ALBUTEROL SULFATE (2.5 MG/3ML) 0.083% IN NEBU: 2.5000 mg | INHALATION_SOLUTION | RESPIRATORY_TRACT | Status: DC | PRN

## 2024-04-16 MED ORDER — IPRATROPIUM-ALBUTEROL 0.5-2.5 (3) MG/3ML IN SOLN
3.0000 mL | Freq: Four times a day (QID) | RESPIRATORY_TRACT | Status: DC
  Administered 2024-04-16: 3 mL via RESPIRATORY_TRACT
  Filled 2024-04-16: qty 3

## 2024-04-16 MED ORDER — IOHEXOL 350 MG/ML SOLN
75.0000 mL | Freq: Once | INTRAVENOUS | Status: AC | PRN
Start: 2024-04-16 — End: 2024-04-16
  Administered 2024-04-16: 75 mL via INTRAVENOUS

## 2024-04-16 MED ORDER — IPRATROPIUM-ALBUTEROL 0.5-2.5 (3) MG/3ML IN SOLN
3.0000 mL | Freq: Four times a day (QID) | RESPIRATORY_TRACT | Status: DC
  Administered 2024-04-16 – 2024-04-17 (×3): 3 mL via RESPIRATORY_TRACT
  Filled 2024-04-16 (×3): qty 3

## 2024-04-16 MED ORDER — SODIUM CHLORIDE 0.9 % IV SOLN
1.0000 g | Freq: Once | INTRAVENOUS | Status: AC
  Administered 2024-04-16: 1 g via INTRAVENOUS
  Filled 2024-04-16: qty 1000

## 2024-04-16 MED ORDER — SODIUM CHLORIDE 0.9 % IV BOLUS
1000.0000 mL | Freq: Once | INTRAVENOUS | Status: AC
  Administered 2024-04-16: 1000 mL via INTRAVENOUS

## 2024-04-16 MED ORDER — IPRATROPIUM-ALBUTEROL 0.5-2.5 (3) MG/3ML IN SOLN
3.0000 mL | Freq: Once | RESPIRATORY_TRACT | Status: AC
  Administered 2024-04-16: 3 mL via RESPIRATORY_TRACT
  Filled 2024-04-16: qty 3

## 2024-04-16 MED ORDER — ENOXAPARIN SODIUM 40 MG/0.4ML IJ SOSY: 40.0000 mg | PREFILLED_SYRINGE | INTRAMUSCULAR | Status: DC

## 2024-04-16 MED ORDER — ACETAMINOPHEN 325 MG PO TABS
650.0000 mg | ORAL_TABLET | Freq: Four times a day (QID) | ORAL | Status: DC | PRN
Start: 2024-04-16 — End: 2024-04-20
  Administered 2024-04-16: 650 mg via ORAL
  Filled 2024-04-16: qty 2

## 2024-04-16 NOTE — ED Notes (Signed)
 Attempted ambulatory trial; pt's pox down to 87% RA. MD made aware. Assisted back to bed with max assist of 2. Will cont to monitor.

## 2024-04-16 NOTE — H&P (Signed)
 History and Physical    Patient: Courtney Mack FMW:[id number] DOB: 07/22/1955 DOA: 04/16/2024 DOS: the patient was seen and examined on 04/16/2024 PCP: [facility]  Patient coming from: Home  Chief Complaint:  Chief Complaint  Patient presents with   Shortness of Breath   HPI: Courtney Mack is a 68 y.o. female with medical history significant of recent UTI, history of nephrostomy tube, history of DVT, type 2 diabetes, history of endometrial cancer, history of GAVE, GERD, hyperlipidemia, essential hypertension, diastolic CHF, history of pulmonary embolism, COPD who presents from home with significant shortness of breath on exertion.  Patient was discharged 2 days ago.  During that hospitalization she was treated with complex UTI.  She did better after arriving home.  Patient however started having exertional dyspnea.  Denied any fever.  She was having cough.  Patient completed her IV meropenem  today.  Suspicion was this was fluid overload.  Patient was given breathing treatments and was being discharged home when she desaturated again.  She was having some mild wheezing.  Suspicion is this could also be COPD exacerbation patient has been admitted for management of exertional dyspnea.  Review of Systems: As mentioned in the history of present illness. All other systems reviewed and are negative. Past Medical History:  Diagnosis Date   Acute bilateral deep vein thrombosis (DVT) of femoral veins (HCC) 01/02/2021   Acute massive pulmonary embolism (HCC) 08/25/2020   Last Assessment & Plan:  Formatting of this note might be different from the original. Post vena caval filter and on xarelto  and O2   Anemia    ARF (acute respiratory failure) (HCC)    Cellulitis of left lower leg 06/20/2021   CHF (congestive heart failure) (HCC)    COVID-19    Diabetes mellitus without complication (HCC)    DVT (deep venous thrombosis) (HCC) 09/06/2020   Endometrial cancer  (HCC)    GAVE (gastric antral vascular ectasia)    GERD (gastroesophageal reflux disease)    High cholesterol    Hx of blood clots    Hyperlipidemia    Hypertension    IDA (iron  deficiency anemia) 09/21/2020   Obesity    Pneumonia 06/11/2022   recovered   Pressure injury of skin 06/21/2021   Pulmonary embolism (HCC)    Sepsis (HCC)    Thrombocytopenia    Urinary tract infection 09/13/2021   Past Surgical History:  Procedure Laterality Date   CATARACT EXTRACTION W/PHACO Right 06/04/2022   Procedure: CATARACT EXTRACTION PHACO AND INTRAOCULAR LENS PLACEMENT (IOC) COMPLICATED RIGHT  31.83  02:17.4;  Surgeon: Jaye Fallow, MD;  Location: [facility];  Service: Ophthalmology;  Laterality: Right;   CATARACT EXTRACTION W/PHACO Left 07/09/2022   Procedure: CATARACT EXTRACTION PHACO AND INTRAOCULAR LENS PLACEMENT (IOC);  Surgeon: Jaye Fallow, MD;  Location: [facility];  Service: Ophthalmology;  Laterality: Left;  22.75 1:40.3   COLONOSCOPY N/A 06/21/2021   Procedure: COLONOSCOPY;  Surgeon: Toledo, Ladell POUR, MD;  Location: [facility] END[facility]COPY;  Service: Gastroenterology;  Laterality: N/A;   CYSTOSCOPY W/ URETERAL STENT PLACEMENT Left 09/11/2021   Procedure: CYSTOSCOPY WITH RETROGRADE PYELOGRAM/URETERAL STENT PLACEMENT;  Surgeon: Twylla Glendia BROCKS, MD;  Location: [facility] OR[facility];  Service: Urology;  Laterality: Left;   CYSTOSCOPY W/ URETERAL STENT PLACEMENT Left 11/17/2021   Procedure: CYSTOSCOPY WITH STENT REPLACEMENT;  Surgeon: Watt Rush, MD;  Location: [facility] OR[facility];  Service: Urology;  Laterality: Left;   CYSTOSCOPY WITH FULGERATION  11/17/2021   Procedure: CYSTOSCOPY WITH FULGERATION;  Surgeon:  Watt Rush, MD;  Location: [facility];  Service: Urology;;   CYSTOSCOPY WITH STENT PLACEMENT Left 10/17/2021   Procedure: CYSTOSCOPY WITH STENT PLACEMENT;  Surgeon: Twylla Glendia BROCKS, MD;  Location: [facility];  Service: Urology;  Laterality: Left;   CYSTOSCOPY WITH URETEROSCOPY, STONE BASKETRY  AND STENT PLACEMENT Left 10/16/2021   Procedure: CYSTOSCOPY WITH URETEROSCOPY  AND STENT REMOVAL;  Surgeon: Twylla Glendia BROCKS, MD;  Location: [facility];  Service: Urology;  Laterality: Left;   ENTEROSCOPY N/A 08/17/2021   Procedure: ENTEROSCOPY;  Surgeon: Unk Corinn Skiff, MD;  Location: [facility];  Service: Gastroenterology;  Laterality: N/A;   ESOPHAGOGASTRODUODENOSCOPY N/A 06/21/2021   Procedure: ESOPHAGOGASTRODUODENOSCOPY (EGD);  Surgeon: Toledo, Ladell POUR, MD;  Location: [facility];  Service: Gastroenterology;  Laterality: N/A;   ESOPHAGOGASTRODUODENOSCOPY (EGD) WITH PROPOFOL  N/A 08/17/2021   Procedure: ESOPHAGOGASTRODUODENOSCOPY (EGD) WITH PROPOFOL ;  Surgeon: Unk Corinn Skiff, MD;  Location: [facility];  Service: Gastroenterology;  Laterality: N/A;   ESOPHAGOGASTRODUODENOSCOPY (EGD) WITH PROPOFOL  N/A 10/14/2021   Procedure: ESOPHAGOGASTRODUODENOSCOPY (EGD) WITH PROPOFOL ;  Surgeon: Jinny Carmine, MD;  Location: [facility];  Service: Endoscopy;  Laterality: N/A;   ESOPHAGOGASTRODUODENOSCOPY (EGD) WITH PROPOFOL  N/A 02/01/2022   Procedure: ESOPHAGOGASTRODUODENOSCOPY (EGD) WITH PROPOFOL ;  Surgeon: Unk Corinn Skiff, MD;  Location: [facility];  Service: Gastroenterology;  Laterality: N/A;   ESOPHAGOGASTRODUODENOSCOPY (EGD) WITH PROPOFOL  N/A 03/15/2022   Procedure: ESOPHAGOGASTRODUODENOSCOPY (EGD) WITH PROPOFOL ;  Surgeon: Therisa Bi, MD;  Location: [facility] ENDOSCOPY;  Service: Gastroenterology;  Laterality: N/A;   ESOPHAGOGASTRODUODENOSCOPY (EGD) WITH PROPOFOL  N/A 04/18/2022   Procedure: ESOPHAGOGASTRODUODENOSCOPY (EGD) WITH PROPOFOL ;  Surgeon: Unk Corinn Skiff, MD;  Location: [facility];  Service: Gastroenterology;  Laterality: N/A;   ESOPHAGOGASTRODUODENOSCOPY (EGD) WITH PROPOFOL  N/A 08/09/2022   Procedure: ESOPHAGOGASTRODUODENOSCOPY (EGD) WITH PROPOFOL ;  Surgeon: Therisa Bi, MD;  Location: [facility] ENDOSCOPY;  Service: Gastroenterology;  Laterality: N/A;   GIVENS CAPSULE STUDY  N/A 06/22/2021   Procedure: GIVENS CAPSULE STUDY;  Surgeon: Toledo, Ladell POUR, MD;  Location: [facility];  Service: Gastroenterology;  Laterality: N/A;   IR CONVERT LEFT NEPHROSTOMY TO NEPHROURETERAL CATH  08/29/2023   IR EXT NEPHROURETERAL CATH EXCHANGE  03/05/2024   IR NEPHROSTOMY EXCHANGE LEFT  02/25/2022   IR NEPHROSTOMY EXCHANGE LEFT  03/27/2022   IR NEPHROSTOMY EXCHANGE LEFT  05/22/2022   IR NEPHROSTOMY EXCHANGE LEFT  07/24/2022   IR NEPHROSTOMY EXCHANGE LEFT  09/24/2022   IR NEPHROSTOMY EXCHANGE LEFT  11/21/2022   IR NEPHROSTOMY EXCHANGE LEFT  02/17/2023   IR NEPHROSTOMY EXCHANGE LEFT  04/06/2023   IR NEPHROSTOMY EXCHANGE LEFT  05/27/2023   IR NEPHROSTOMY EXCHANGE LEFT  07/28/2023   IR NEPHROSTOMY PLACEMENT LEFT  12/27/2021   IVC FILTER INSERTION N/A 08/18/2020   Procedure: IVC FILTER INSERTION;  Surgeon: Jama Cordella MATSU, MD;  Location: [facility];  Service: Cardiovascular;  Laterality: N/A;   PERIPHERAL VASCULAR THROMBECTOMY Bilateral 01/03/2021   Procedure: PERIPHERAL VASCULAR THROMBECTOMY;  Surgeon: Marea Selinda RAMAN, MD;  Location: [facility];  Service: Cardiovascular;  Laterality: Bilateral;   PULMONARY THROMBECTOMY N/A 08/18/2020   Procedure: PULMONARY THROMBECTOMY;  Surgeon: Jama Cordella MATSU, MD;  Location: [facility];  Service: Cardiovascular;  Laterality: N/A;   TUBAL LIGATION     VISCERAL ANGIOGRAPHY N/A 07/18/2021   Procedure: VISCERAL ANGIOGRAPHY;  Surgeon: Marea Selinda RAMAN, MD;  Location: [facility];  Service: Cardiovascular;  Laterality: N/A;   Social History:  reports that she has never smoked. She has never used smokeless tobacco. She reports that she does  not currently use alcohol . She reports that she does not use drugs.  No Known Allergies  Family History  Problem Relation Age of Onset   Hypertension Mother    Diabetes Mother    Diabetes Father    Hypertension Father    High Cholesterol Father    Congestive Heart Failure Father     Breast cancer Cousin     Prior to Admission medications   Medication Sig Start Date End Date Taking? Authorizing Provider  acetaminophen  (TYLENOL ) 500 MG tablet Take 500 mg by mouth every 6 (six) hours as needed for mild pain or fever.   Yes [provider]  albuterol  (VENTOLIN  HFA) 108 (90 Base) MCG/ACT inhaler Inhale 2 puffs into the lungs every 6 (six) hours as needed for wheezing or shortness of breath. Patient taking differently: Inhale 2 puffs into the lungs every 4 (four) hours as needed for wheezing or shortness of breath. 10/26/22  Yes Laurita Pillion, MD  Ascorbic Acid  (VITAMIN C ) 1000 MG tablet Take 1,000 mg by mouth daily.   Yes [provider]  cyanocobalamin  100 MCG tablet Take 100 mcg by mouth daily.   Yes [provider]  enoxaparin  (LOVENOX ) 30 MG/0.3ML injection Inject 0.3 mLs (30 mg total) into the skin every 12 (twelve) hours. 08/10/23  Yes Barbarann Nest, MD  fluticasone  furoate-vilanterol (BREO ELLIPTA ) 100-25 MCG/ACT AEPB Inhale 1 puff into the lungs daily.   Yes [provider]  furosemide  (LASIX ) 20 MG tablet Take 20 mg by mouth daily. 04/15/23  Yes [provider]  loperamide  (IMODIUM ) 2 MG capsule Take 2 mg by mouth as needed for diarrhea or loose stools. 08/14/23  Yes [provider]  pantoprazole  (PROTONIX ) 40 MG tablet Take 40 mg by mouth 2 (two) times daily. 01/01/24  Yes [provider]  rosuvastatin  (CRESTOR ) 10 MG tablet TAKE 1 TABLET(10 MG) BY MOUTH DAILY 12/26/23  Yes Hackney, Tina A, FNP  simethicone  (MYLICON) 80 MG chewable tablet Chew 1 tablet (80 mg total) by mouth every 6 (six) hours as needed for flatulence. 04/08/23  Yes Wouk, Devaughn Sayres, MD  trimethoprim  (TRIMPEX ) 100 MG tablet Take 1 tablet (100 mg total) by mouth at bedtime. 08/10/23  Yes Barbarann Nest, MD    Physical Exam: Vitals:   04/16/24 1800 04/16/24 1845 04/16/24 1900 04/16/24 1914  BP: (!) 90/38  (!) 102/44   Pulse: 85 88 79 82   Resp: (!) 21 (!) 21 (!) 25 20  Temp:      SpO2:    (!) 87%  Weight:      Height:       Constitutional: Acutely ill looking, NAD, calm, comfortable Eyes: PERRL, lids and conjunctivae normal ENMT: Mucous membranes are moist. Posterior pharynx clear of any exudate or lesions.Normal dentition.  Neck: normal, supple, no masses, no thyromegaly Respiratory: Coarse breath sound with some mild wheeze, normal respiratory effort. No accessory muscle use.  Cardiovascular: Regular rate and rhythm, no murmurs / rubs / gallops. No extremity edema. 2+ pedal pulses. No carotid bruits.  Abdomen: no tenderness, no masses palpated. No hepatosplenomegaly. Bowel sounds positive.  Musculoskeletal: Good range of motion, no joint swelling or tenderness, Skin: no rashes, lesions, ulcers. No induration Neurologic: CN 2-12 grossly intact. Sensation intact, DTR normal. Strength 5/5 in all 4.  Psychiatric: Normal judgment and insight. Alert and oriented x 3. Normal mood  Data Reviewed:  Temperature 97.2, blood pressure 112/50, pulse 89 respiratory rate of 25 oxygen sat 87% on room air.  Hemoglobin is 8.7 with platelets 248.  Calcium  8.5 glucose 113.  Urinalysis showed cloudy urine with large leukocytes.  WBC more than 50 but no bacteria seen.  CT angiogram of the chest showed no evidence of pulmonary embolus and areas of atelectasis in the lower lobes.  Chest x-ray showed low lung volumes with mildly increased interstitial lung markings probably edema, acute viral screen negative for flu RSV and COVID-19..  Assessment and Plan:  #1 exertional dyspnea: Possibly mild exacerbation versus CHF especially after recent hospitalization and IV antibiotics.  COPD exacerbation also suspected.  At this point patient will be admitted for observation.  Initiate oral doxycycline  as well as breathing treatments.  Patient also on diuretics.  No steroids especially with recent complex UTI.  Will watch patient's response overnight.   Oxygen as needed.  #2 complex UTI: Patient is status post nephrostomy tube.  Completed IV antibiotics today.  She has scheduled follow-up with infectious disease.  If no further complications she will follow-up with ID.  #3 chronic anemia: Hemoglobin 8.7.  Continue to monitor.  #4 pyuria: Asymptomatic.  Just completed treatment.  Will monitor only.  #5 history of DVT and PE: On subcutaneous Lovenox .  Will continue  #6 GERD: Continue PPIs.  #7 history of endometrial cancer: In remission.   Advance Care Planning:   Code Status: Full Code   Consults: None  Family Communication: No family at bedside  Severity of Illness: The appropriate patient status for this patient is OBSERVATION. Observation status is judged to be reasonable and necessary in order to provide the required intensity of service to ensure the patient's safety. The patient's presenting symptoms, physical exam findings, and initial radiographic and laboratory data in the context of their medical condition is felt to place them at decreased risk for further clinical deterioration. Furthermore, it is anticipated that the patient will be medically stable for discharge from the hospital within 2 midnights of admission.   AuthorBETHA SIM KNOLL, MD 04/16/2024 8:35 PM  For on call review [email or web address].

## 2024-04-16 NOTE — ED Triage Notes (Signed)
 Arrives from home via [facility]  Recently discharged from hospital due UTI. Was scheduled to get an antibiotic injection today. Called EMS for C/O SOB with exertion.  134/73 96% 92  HX: CHF, clots, edema

## 2024-04-16 NOTE — ED Provider Notes (Signed)
 [facility] Provider Note    Event Date/Time   First MD Initiated Contact with Patient 04/16/24 1510     (approximate)   History   Shortness of Breath   HPI  Courtney Mack is a 68 y.o. female  with history of DVT/PE on Lovenox , COPD hypertension, GERD, left nephrostomy tube status, hyperlipidemia, recent admission for ESBL E coli hypotension discharged on outpatient ertapenum on 10/8 with last dose scheduled today  who presents to the ED for shortness of breath.  Patient reports shortness of breath with any sort of exertion such as walking.  Denies symptoms at rest.  Denies any chest pain worsening abdominal pain, or worsening leg edema.  She tells me that she is asymptomatic while laying in bed.  She does endorse some left-sided abdominal pain at the site of the nephrostomy tube however this has been constant since placement.  There was a plan to remove the tube in 2 weeks time by urology office.  She was scheduled to receive her last dose of order pending today but missed it.  She reports compliance with her Lovenox  shots      Physical Exam   Triage Vital Signs: ED Triage Vitals  Encounter Vitals Group     BP 04/16/24 1413 (!) 111/56     Girls Systolic BP Percentile --      Girls Diastolic BP Percentile --      Boys Systolic BP Percentile --      Boys Diastolic BP Percentile --      Pulse Rate 04/16/24 1408 92     Resp 04/16/24 1408 18     Temp 04/16/24 1408 99.3 F (37.4 C)     Temp src --      SpO2 04/16/24 1408 94 %     Weight 04/16/24 1405 198 lb 6.6 oz (90 kg)     Height 04/16/24 1405 4' 11 (1.499 m)     Head Circumference --      Peak Flow --      Pain Score 04/16/24 1405 3     Pain Loc --      Pain Education --      Exclude from Growth Chart --     Most recent vital signs: Vitals:   04/16/24 1914 04/16/24 2127  BP:  (!) 112/32  Pulse: 82 88  Resp: 20   Temp:  98.3 F (36.8 C)  SpO2: (!) 87% 94%    Nursing Triage Note  reviewed. Vital signs reviewed and patients oxygen saturation is normoxic  General: Patient is well nourished, well developed, awake and alert, resting comfortably in no acute distress Head: Normocephalic and atraumatic Eyes: Normal inspection, extraocular muscles intact, no conjunctival pallor Ear, nose, throat: Normal external exam Neck: Normal range of motion Respiratory: Patient is in no respiratory distress, lungs with mild wheezes Cardiovascular: Patient is not tachycardic, RR Patient has a PICC line in place in left upper extremity GI: Abd soft, very mild left CVA tenderness with no guarding or rebound  Patient does have nephrostomy tube in place in left back.  There is surrounding mild skin breakdown but does not seem consistent with cellulitis Back: Normal inspection of the back with good strength and range of motion throughout all ext Extremities: pulses intact with good cap refills, 1+ LE pitting edema no calf tenderness Neuro: The patient is alert and oriented to person, place, and time, appropriately conversive, with 5/5 bilat UE/LE strength, no gross motor or sensory  defects noted. Coordination appears to be adequate. Skin: Warm, dry, and intact Psych: normal mood and affect, no SI or HI  ED Results / Procedures / Treatments   Labs (all labs ordered are listed, but only abnormal results are displayed) Labs Reviewed  CBC - Abnormal; Notable for the following components:      Result Value   RBC 3.36 (*)    Hemoglobin 8.7 (*)    HCT 29.7 (*)    MCH 25.9 (*)    MCHC 29.3 (*)    RDW 18.0 (*)    All other components within normal limits  BASIC METABOLIC PANEL WITH GFR - Abnormal; Notable for the following components:   Glucose, Bld 113 (*)    Calcium  8.5 (*)    Anion gap 16 (*)    All other components within normal limits  URINALYSIS, ROUTINE W REFLEX MICROSCOPIC - Abnormal; Notable for the following components:   Color, Urine YELLOW (*)    APPearance CLOUDY (*)     Protein, ur 100 (*)    Leukocytes,Ua LARGE (*)    All other components within normal limits  RESP PANEL BY RT-PCR (RSV, FLU A&B, COVID)  RVPGX2  URINE CULTURE  PROCALCITONIN  BRAIN NATRIURETIC PEPTIDE  LACTIC ACID, PLASMA  COMPREHENSIVE METABOLIC PANEL WITH GFR  CBC  HIV ANTIBODY (ROUTINE TESTING W REFLEX)  TROPONIN I (HIGH SENSITIVITY)  TROPONIN I (HIGH SENSITIVITY)     EKG EKG and rhythm strip are interpreted by myself:   EKG: [Normal sinus rhythm] at heart rate of 88, normal QRS duration, QTc 442, nonspecific ST segments and T waves no ectopy EKG not consistent with Acute STEMI Rhythm strip: NSR in lead II   RADIOLOGY CXR: No acute abnormalities on my independent review interpretation and radiologist agrees CT angio PE: No acute abnormality    PROCEDURES:  Critical Care performed: No  Procedures   MEDICATIONS ORDERED IN ED: Medications  doxycycline  (VIBRA -TABS) tablet 100 mg (100 mg Oral Given 04/16/24 2232)  ipratropium-albuterol  (DUONEB) 0.5-2.5 (3) MG/3ML nebulizer solution 3 mL (3 mLs Nebulization Given 04/16/24 2232)  albuterol  (PROVENTIL ) (2.5 MG/3ML) 0.083% nebulizer solution 2.5 mg (has no administration in time range)  enoxaparin  (LOVENOX ) injection 30 mg (30 mg Subcutaneous Given 04/16/24 2231)  acetaminophen  (TYLENOL ) tablet 650 mg (650 mg Oral Given 04/16/24 2232)  ipratropium-albuterol  (DUONEB) 0.5-2.5 (3) MG/3ML nebulizer solution 3 mL (3 mLs Nebulization Given 04/16/24 1611)  ertapenem (INVANZ) 1 g in sodium chloride  0.9 % 100 mL IVPB (0 g Intravenous Stopped 04/16/24 1659)  iohexol  (OMNIPAQUE ) 350 MG/ML injection 75 mL (75 mLs Intravenous Contrast Given 04/16/24 1648)  sodium chloride  0.9 % bolus 1,000 mL (1,000 mLs Intravenous New Bag/Given 04/16/24 2048)     IMPRESSION / MDM / ASSESSMENT AND PLAN / ED COURSE                                Differential diagnosis includes, but is not limited to, recurrent UTI, PE, pneumonia, upper respiratory  illness, CHF exacerbation, COPD, electrolyte derangement  ED course: Patient arrives and she is not hypotensive and not hypoxic on room air.  I reviewed patient's course of care and prior imaging.  Patient just had a CT abdomen pelvis completed less than 10 days ago we will hold off on repeat imaging of the abdomen at this time given no acutely worsening abdominal pain.  Given that she did miss her last dose of ertapenem today  will provide such.  She does not have a leukocytosis and her hemoglobin is better than what it was during the hospitalization.  She has no profound electrolyte derangements and creatinine is not elevated.  I have added on a BNP to evaluate for CHF and troponin for atypical ACS however EKG demonstrates no evidence of strain.  I have given a COVID swab which is pending.  Given her history of a PE and DVT and her recent hospitalization I do think it is pertinent to evaluate for pulmonary embolism and will obtain a CT angio chest.  Given her history of COPD will provide a DuoNeb.  Will reevaluate for possible signs of sepsis however vital signs are reassuring.  Disposition is pending remainder of diagnostics   Clinical Course as of 04/16/24 2351  Fri Apr 16, 2024  1523 Creatinine: 0.87 Not significantly elevated.  [HD]  1523 WBC: 4.9 No leukocytosis [HD]  1523 Hemoglobin(!): 8.7 Better than previous [HD]  1636 Troponin I (High Sensitivity): 6 Not elevated [HD]  1636 B Natriuretic Peptide: 54.2 Not elevated [HD]  1636 Lactic Acid, Venous: 0.8 Not elevated [HD]  1636 Resp panel by RT-PCR (RSV, Flu A&B, Covid) Anterior Nasal Swab Negative [HD]  1636 DG Chest 1 View Largely unremarkable on my independent review interpretation [HD]  1801 CT Angio Chest PE W and/or Wo Contrast CT PE unremarkable [HD]  1801 Procalcitonin: <0.10 Not elevated [HD]  1806 Urinalysis, Routine w reflex microscopic -Urine, Clean Catch(!) Still has a large amount of leukocytosis but no bacteria and  seems improved from previous.  Will send for repeat urine culture but at this time will not treat [HD]  1809 Patient reassessed feels improved.  States that she feels comfortable returning home.  Will ambulate the patient make sure she does not drop her oxygen and if this is okay we will plan for discharge [HD]  1844 Unfortunately patient is unable to ambulate with a walker and per nursing she became increasingly labored with any exertion and dropped her oxygen to 87%.  At this time I do not have a cause for her symptoms except for possible bronchospasm.  I will place her on scheduled DuoNebs and call her into the hospitalist [HD]  1926 Case discussed with hospitalist for admission [HD]    Clinical Course User Index [HD] Nicholaus Rolland BRAVO, MD   -- Risk: 5 This patient has a high risk of morbidity due to further diagnostic testing or treatment. Rationale: This patient's evaluation and management involve a high risk of morbidity due to the potential severity of presenting symptoms, need for diagnostic testing, and/or initiation of treatment that may require close monitoring. The differential includes conditions with potential for significant deterioration or requiring escalation of care. Treatment decisions in the ED, including medication administration, procedural interventions, or disposition planning, reflect this level of risk. COPA: 5 The patient has the following acute or chronic illness/injury that poses a possible threat to life or bodily function: [X] : The patient has a potentially serious acute condition or an acute exacerbation of a chronic illness requiring urgent evaluation and management in the Emergency Department. The clinical presentation necessitates immediate consideration of life-threatening or function-threatening diagnoses, even if they are ultimately ruled out.   FINAL CLINICAL IMPRESSION(S) / ED DIAGNOSES   Final diagnoses:  Shortness of breath  Hypoxia  Impaired ambulation      Rx / DC Orders   ED Discharge Orders     None        Note:  This document was prepared using Dragon voice recognition software and may include unintentional dictation errors.   Nicholaus Rolland BRAVO, MD 04/16/24 2351

## 2024-04-16 NOTE — ED Triage Notes (Signed)
 Pt to ED for shob started today. RR Even and unlabored. Also c/o chronic abd pain

## 2024-04-17 DIAGNOSIS — J441 Chronic obstructive pulmonary disease with (acute) exacerbation: Secondary | ICD-10-CM | POA: Diagnosis not present

## 2024-04-17 LAB — COMPREHENSIVE METABOLIC PANEL WITH GFR
ALT: 8 U/L (ref 0–44)
AST: 19 U/L (ref 15–41)
Albumin: 2.4 g/dL — ABNORMAL LOW (ref 3.5–5.0)
Alkaline Phosphatase: 46 U/L (ref 38–126)
Anion gap: 7 (ref 5–15)
BUN: 13 mg/dL (ref 8–23)
CO2: 26 mmol/L (ref 22–32)
Calcium: 8.2 mg/dL — ABNORMAL LOW (ref 8.9–10.3)
Chloride: 107 mmol/L (ref 98–111)
Creatinine, Ser: 0.99 mg/dL (ref 0.44–1.00)
GFR, Estimated: >60 mL/min (ref >60)
Glucose, Bld: 107 mg/dL — ABNORMAL HIGH (ref 70–99)
Potassium: 3.2 mmol/L — ABNORMAL LOW (ref 3.5–5.1)
Sodium: 140 mmol/L (ref 135–145)
Total Bilirubin: 0.6 mg/dL (ref 0.0–1.2)
Total Protein: 6 g/dL — ABNORMAL LOW (ref 6.5–8.1)

## 2024-04-17 LAB — IRON AND TIBC
Iron: 12 ug/dL — ABNORMAL LOW (ref 28–170)
Saturation Ratios: 5 % — ABNORMAL LOW (ref 10.4–31.8)
TIBC: 269 ug/dL (ref 250–450)
UIBC: 257 ug/dL

## 2024-04-17 LAB — VITAMIN B12: Vitamin B-12: 343 pg/mL (ref 180–914)

## 2024-04-17 LAB — CBC
HCT: 25.7 % — ABNORMAL LOW (ref 36.0–46.0)
Hemoglobin: 7.7 g/dL — ABNORMAL LOW (ref 12.0–15.0)
MCH: 25.9 pg — ABNORMAL LOW (ref 26.0–34.0)
MCHC: 30 g/dL (ref 30.0–36.0)
MCV: 86.5 fL (ref 80.0–100.0)
Platelets: 249 K/uL (ref 150–400)
RBC: 2.97 MIL/uL — ABNORMAL LOW (ref 3.87–5.11)
RDW: 18.1 % — ABNORMAL HIGH (ref 11.5–15.5)
WBC: 4.5 K/uL (ref 4.0–10.5)
nRBC: 0 % (ref 0.0–0.2)

## 2024-04-17 LAB — FOLATE: Folate: >20 ng/mL (ref >5.9)

## 2024-04-17 LAB — FERRITIN: Ferritin: 13 ng/mL (ref 11–307)

## 2024-04-17 MED ORDER — POTASSIUM CHLORIDE CRYS ER 20 MEQ PO TBCR
40.0000 meq | EXTENDED_RELEASE_TABLET | Freq: Once | ORAL | Status: AC
  Administered 2024-04-17: 40 meq via ORAL
  Filled 2024-04-17: qty 2

## 2024-04-17 MED ORDER — ENSURE PLUS HIGH PROTEIN PO LIQD
237.0000 mL | ORAL | Status: DC
  Administered 2024-04-17 – 2024-04-20 (×4): 237 mL via ORAL

## 2024-04-17 MED ORDER — CHLORHEXIDINE GLUCONATE CLOTH 2 % EX PADS
6.0000 | MEDICATED_PAD | Freq: Every day | CUTANEOUS | Status: DC
  Administered 2024-04-17 – 2024-04-20 (×4): 6 via TOPICAL

## 2024-04-17 MED ORDER — FLUTICASONE FUROATE-VILANTEROL 100-25 MCG/ACT IN AEPB
1.0000 | INHALATION_SPRAY | Freq: Every day | RESPIRATORY_TRACT | Status: DC
  Administered 2024-04-17 – 2024-04-20 (×4): 1 via RESPIRATORY_TRACT
  Filled 2024-04-17: qty 28

## 2024-04-17 MED ORDER — ADULT MULTIVITAMIN W/MINERALS CH
1.0000 | ORAL_TABLET | Freq: Every day | ORAL | Status: DC
  Administered 2024-04-17 – 2024-04-20 (×4): 1 via ORAL
  Filled 2024-04-17 (×4): qty 1

## 2024-04-17 NOTE — Plan of Care (Signed)
  Problem: Education: Goal: Knowledge of General Education information will improve Description: Including pain rating scale, medication(s)/side effects and non-pharmacologic comfort measures Outcome: Progressing Note: States she is normally able to manage her needs at home   Problem: Clinical Measurements: Goal: Ability to maintain clinical measurements within normal limits will improve Outcome: Progressing Note: Patient is now on RA. Goal: Will remain free from infection Outcome: Progressing Note: No signs or symptoms of infection   Problem: Nutrition: Goal: Adequate nutrition will be maintained Outcome: Progressing Note: Adequate nutrition as evidenced by 75% of meal consumption   Problem: Clinical Measurements: Goal: Respiratory complications will improve Outcome: Not Progressing Note: SOB with minimal exertion   Problem: Activity: Goal: Risk for activity intolerance will decrease Outcome: Not Progressing Note: Activity intolerance related to SOB

## 2024-04-17 NOTE — Progress Notes (Signed)
 Patient was not able to walk for an O2 check due to dyspnea with mild exertion.

## 2024-04-17 NOTE — Progress Notes (Signed)
 Attempted to ambulate patient to check O2 sats per orders. States she wants to wait until this afternoon.

## 2024-04-17 NOTE — Plan of Care (Signed)

## 2024-04-17 NOTE — Progress Notes (Signed)
  PROGRESS NOTE    RHONNA HOLSTER  FMW:[id number] DOB: July 19, 1955 DOA: 04/16/2024 PCP: [facility]  125A/125A-AA  LOS: 0 days   Brief hospital course:   Assessment & Plan: CAIYA BETTES is a 68 y.o. female with medical history significant of recent UTI, history of nephrostomy tube, history of DVT, type 2 diabetes, history of endometrial cancer, history of GAVE, GERD, hyperlipidemia, essential hypertension, diastolic CHF, history of pulmonary embolism, COPD who presents from home with significant shortness of breath on exertion.  Patient was discharged 2 days ago.  During that hospitalization she was treated with complex UTI.  She did better after arriving home.  Patient however started having exertional dyspnea.   Attempted ambulatory trial in the ED, pt's pox down to 87% RA, pt was therefore admitted.   #1 exertional dyspnea:  Brief acute hypoxemic respiratory failure --no strong evidence of PNA or fluid overload.  Procal neg, WBC and BNP wnl.  CT chest no acute finding.  No wheezing to suggest COPD exacerbation.  No more O2 desat next day while working with PT/OT.  DOE may be due to deconditioning. --d/c doxycycline    COPD --no wheezing to suggest exacerbation. --cont home bronchodilators   #2 complex UTI:  Patient is status post nephrostomy tube.  Completed IV antibiotics course on 10/10.  She has scheduled follow-up with infectious disease.     #3 chronic anemia:  --anemia workup   #5 history of DVT and PE:  --cont Lovenox    #6 GERD: Continue PPIs.   #7 history of endometrial cancer: In remission.   DVT prophylaxis: Lovenox  SQ Code Status: Full code  Family Communication:  Level of care: Med-Surg Dispo:   The patient is from: home Anticipated d/c is to: home Anticipated d/c date is: tomorrow   Subjective and Interval History:  Pt continued to have dyspnea, however, no O2 desats with PT/OT.  Pt reported she started to have a cough  and scratchy throat.   Objective: Vitals:   04/17/24 0447 04/17/24 0500 04/17/24 0716 04/17/24 0906  BP: (!) 118/46 (!) 90/52  (!) 113/43  Pulse: 92   85  Resp:    16  Temp: 98.4 F (36.9 C)   98.3 F (36.8 C)  TempSrc: Oral     SpO2: 98%  98% 99%  Weight:      Height:        Intake/Output Summary (Last 24 hours) at 04/17/2024 1703 Last data filed at 04/17/2024 0900 Gross per 24 hour  Intake 480 ml  Output --  Net 480 ml   Filed Weights   04/16/24 1405  Weight: 90 kg    Examination:   Constitutional: NAD, AAOx3 HEENT: conjunctivae and lids normal, EOMI CV: No cyanosis.   RESP: normal respiratory effort, no wheezes, on RA Neuro: II - XII grossly intact.   Psych: Normal mood and affect.  Appropriate judgement and reason   Data Reviewed: I have personally reviewed labs and imaging studies  Time spent: 50 minutes  Ellouise Haber, MD [facility] If 7PM-7AM, please contact night-coverage 04/17/2024, 5:03 PM

## 2024-04-17 NOTE — Evaluation (Signed)
 Physical Therapy Evaluation Patient Details Name: Courtney Mack MRN: [id number] DOB: June 25, 1956 Today's Date: 04/17/2024  History of Present Illness  Pt is a 68 y/o F admitted on 04/16/24 after presenting with c/o SOB. Pt was d/c 2 days prior & treated for complex UTI. DOE possibly 2/2 COPD exacerbation vs CHF. PMH: UTI, nephrostomy tube, DVT, DM2, endometrial CA, GAVE, GERD, HLD, HTN, dCHF, PE, COPD  Clinical Impression  Pt seen for PT evaluation with received in bed, agreeable to tx. Pt reports prior to admission she slept in recliner & ambulated with rollator, family assisted with washing her hair, pt able to transfer on/off 3-in-1 without assistance. On this date, pt is able to complete supine>sit with extra time, hospital bed features, min assist. Pt transfers from EOB, recliner with RW & min assist, progressing to ambulating short distance with RW & chair follow for safety. Pt with labored breathing but SPO2 98-100% on room air, max HR 120bpm. Recommend ongoing PT services to progress mobility as able. Currently recommending post acute rehab <3hours therapy/day upon d/c but hopeful with continued mobilization in acute setting, can progress to HHPT.        If plan is discharge home, recommend the following: A little help with walking and/or transfers;A little help with bathing/dressing/bathroom;Assistance with cooking/housework;Assist for transportation;Help with stairs or ramp for entrance   Can travel by private vehicle        Equipment Recommendations None recommended by PT  Recommendations for Other Services       Functional Status Assessment Patient has had a recent decline in their functional status and demonstrates the ability to make significant improvements in function in a reasonable and predictable amount of time.     Precautions / Restrictions Precautions Precautions: Fall Precaution/Restrictions Comments: L nephrostomy tube Restrictions Weight Bearing Restrictions  Per Provider Order: No      Mobility  Bed Mobility Overal bed mobility: Needs Assistance Bed Mobility: Supine to Sit     Supine to sit: Min assist, HOB elevated, Used rails (extra time but able to walk feet to EOB, upright trunk with hospital bed features)          Transfers Overall transfer level: Needs assistance Equipment used: Rolling walker (2 wheels) Transfers: Sit to/from Stand Sit to Stand: Mod assist Stand pivot transfers: Min assist (bed>recliner on R with RW)         General transfer comment: Pt initially transfers sit<>stand from EOB, requesting PT hold walker stable so pt pulls to standing (uses rollator at home), but is able to transfers sit>stand from recliner with ability to push to standnig, cuing to transition hands from recliner to RW.    Ambulation/Gait Ambulation/Gait assistance: Contact guard assist Gait Distance (Feet): 4 Feet Assistive device: Rolling walker (2 wheels) Gait Pattern/deviations: Decreased step length - right, Decreased step length - left, Decreased stride length, Decreased dorsiflexion - left, Decreased dorsiflexion - right Gait velocity: decreased     General Gait Details: chair follow for safety, rest breaks PRN  Stairs            Wheelchair Mobility     Tilt Bed    Modified Rankin (Stroke Patients Only)       Balance Overall balance assessment: Needs assistance Sitting-balance support: Feet supported Sitting balance-Leahy Scale: Good Sitting balance - Comments: static sitting EOB with supervision   Standing balance support: During functional activity, Bilateral upper extremity supported, Reliant on assistive device for balance Standing balance-Leahy Scale: Fair  Pertinent Vitals/Pain Pain Assessment Pain Assessment: No/denies pain    Home Living Family/patient expects to be discharged to:: Private residence Living Arrangements: Children Available Help at  Discharge: Family;Available 24 hours/day Type of Home: Apartment Home Access: Stairs to enter Entrance Stairs-Rails: None Entrance Stairs-Number of Steps: 1   Home Layout: One level Home Equipment: Rollator (4 wheels);BSC/3in1;Shower seat;Wheelchair - manual;Cane - quad;Cane - single point;Adaptive equipment Additional Comments: sponge bathes at baseline. sleeps in a recliner at home    Prior Function Prior Level of Function : Needs assist             Mobility Comments: amb in house with rollator, has manual w/c for community distances, sleeps in recliner at home ADLs Comments: Mod I for ADLs, sponge bathing at baseline w/ daughter assisting to wash hair. assist with compression stockings. Daughter manages most IADLs such as meals but pt reports managing own meds.     Extremity/Trunk Assessment   Upper Extremity Assessment Upper Extremity Assessment: Generalized weakness    Lower Extremity Assessment Lower Extremity Assessment: Generalized weakness (pt with BLE compression socks already donned)       Communication   Communication Communication: No apparent difficulties    Cognition Arousal: Alert Behavior During Therapy: WFL for tasks assessed/performed   PT - Cognitive impairments: No apparent impairments                       PT - Cognition Comments: pleasant Following commands: Intact       Cueing Cueing Techniques: Verbal cues     General Comments General comments (skin integrity, edema, etc.): Pt received on 1L/min, weaned to room air, SpO2 98-100% on room air, max HR 120 bpm with gait attempts. Pt with labored breathing with mobility & education re: pursed lip breathing.    Exercises     Assessment/Plan    PT Assessment Patient needs continued PT services  PT Problem List Decreased strength;Cardiopulmonary status limiting activity;Decreased activity tolerance;Decreased balance;Decreased mobility;Decreased knowledge of precautions;Decreased  safety awareness;Decreased knowledge of use of DME       PT Treatment Interventions Balance training;Gait training;Neuromuscular re-education;DME instruction;Stair training;Functional mobility training;Therapeutic activities;Manual techniques;Therapeutic exercise;Patient/family education    PT Goals (Current goals can be found in the Care Plan section)  Acute Rehab PT Goals Patient Stated Goal: get better PT Goal Formulation: With patient Time For Goal Achievement: 05/01/24 Potential to Achieve Goals: Good    Frequency Min 2X/week     Co-evaluation               AM-PAC PT 6 Clicks Mobility  Outcome Measure Help needed turning from your back to your side while in a flat bed without using bedrails?: A Little Help needed moving from lying on your back to sitting on the side of a flat bed without using bedrails?: A Lot Help needed moving to and from a bed to a chair (including a wheelchair)?: A Little Help needed standing up from a chair using your arms (e.g., wheelchair or bedside chair)?: A Little Help needed to walk in hospital room?: A Little Help needed climbing 3-5 steps with a railing? : A Lot 6 Click Score: 16    End of Session Equipment Utilized During Treatment: Oxygen Activity Tolerance: Patient tolerated treatment well;Patient limited by fatigue Patient left: in chair;with call bell/phone within reach;with bed alarm set Nurse Communication: Mobility status (HR, O2) PT Visit Diagnosis: Muscle weakness (generalized) (M62.81);Other abnormalities of gait and mobility (R26.89);Difficulty in walking, not elsewhere  classified (R26.2);Unsteadiness on feet (R26.81)    Time: 8786-8769 PT Time Calculation (min) (ACUTE ONLY): 17 min   Charges:   PT Evaluation $PT Eval Moderate Complexity: 1 Mod   PT General Charges $$ ACUTE PT VISIT: 1 Visit         Richerd Pinal, PT, DPT 04/17/24, 12:54 PM   Richerd CHRISTELLA Pinal 04/17/2024, 12:51 PM

## 2024-04-17 NOTE — Evaluation (Signed)
 Occupational Therapy Evaluation Patient Details Name: Courtney Mack MRN: [id number] DOB: [birth year] Today's Date: 04/17/2024   History of Present Illness   ALLESANDRA HUEBSCH is a 68 y.o. female with medical history significant of recent UTI, history of nephrostomy tube, history of DVT, type 2 diabetes, history of endometrial cancer, history of GAVE, GERD, hyperlipidemia, essential hypertension, diastolic CHF, history of pulmonary embolism, COPD who presents from home with significant shortness of breath on exertion.  Patient was discharged 2 days ago.  During that hospitalization she was treated with complex UTI.     Clinical Impressions Patient was seen for OT evaluation this date. Prior to hospital admission, patient was recently discharged two days prior after hospitalization related to UTI and nephrostomy tube placement. Patient lives in a first level apartment with her daughter and 4 grand children ages ranging 10-21, they provide patient with 24/7 assistance/ support. Patients baseline is mod I for dressing/bathing (at sink) and limited engagement in IADLs, she uses a rollator within the apartment and wheelchair for community mobility. Patient returned to ED 2 days after dc for new/worsening shortness of breath, negative for PE; she has been placed on 2L of O2 and did not use O2 prior to this admission. Patient satting 99% at rest on 2L, removed and placed on RA, patient continued to sat 95%-98% on RA throughout session. OT notified MD/RN/PT and patient left on RA. Patient required mod A for bed mobility and min A for sit<>stand using RW, performed SPT from EOB to [facility] with CGA.  Patient presents with deficits in activity tolerance, standing tolerance and gross strength, affecting safe and optimal ADL completion. Patient is currently requiring mod-max A for basic ADLs and min A for transfers.  Paient would benefit from skilled OT services to address noted impairments and functional  limitations (see below for any additional details) in order to maximize safety and independence while minimizing future risk of falls, injury, and readmission. Do  anticipate the need for follow up OT services upon acute hospital DC.      If plan is discharge home, recommend the following:   A lot of help with walking and/or transfers;A lot of help with bathing/dressing/bathroom;Assistance with cooking/housework;Assist for transportation     Functional Status Assessment   Patient has had a recent decline in their functional status and demonstrates the ability to make significant improvements in function in a reasonable and predictable amount of time.     Equipment Recommendations   None recommended by OT     Recommendations for Other Services         Precautions/Restrictions   Precautions Precautions: Fall Recall of Precautions/Restrictions: Intact Precaution/Restrictions Comments: L nephrostomy tube Restrictions Weight Bearing Restrictions Per Provider Order: No     Mobility Bed Mobility Overal bed mobility: Needs Assistance Bed Mobility: Supine to Sit, Sit to Supine     Supine to sit: Mod assist Sit to supine: Mod assist   General bed mobility comments: A for BLE and trunk    Transfers Overall transfer level: Modified independent Equipment used: Rolling walker (2 wheels) Transfers: Bed to chair/wheelchair/BSC Sit to Stand: Min assist, Contact guard assist Stand pivot transfers: Contact guard assist         General transfer comment: from EOB to [facility] and back      Balance Overall balance assessment: Needs assistance Sitting-balance support: No upper extremity supported, Feet supported Sitting balance-Leahy Scale: Good     Standing balance support: During functional activity, Bilateral upper extremity supported  Standing balance-Leahy Scale: Fair                             ADL either performed or assessed with clinical judgement    ADL Overall ADL's : Needs assistance/impaired Eating/Feeding: Independent   Grooming: Oral care Grooming Details (indicate cue type and reason): sitting EOB Upper Body Bathing: Minimal assistance;Sitting   Lower Body Bathing: Moderate assistance;Sitting/lateral leans;Sit to/from stand   Upper Body Dressing : Minimal assistance   Lower Body Dressing: Maximal assistance;Sit to/from stand Lower Body Dressing Details (indicate cue type and reason): to doff/don pull up Toilet Transfer: Contact guard assist;Stand-pivot;BSC/3in1   Toileting- Clothing Manipulation and Hygiene: Moderate assistance       Functional mobility during ADLs: Minimal assistance;Rolling walker (2 wheels)       Vision Baseline Vision/History: 1 Wears glasses       Perception         Praxis         Pertinent Vitals/Pain Pain Assessment Pain Assessment: No/denies pain Pain Score: 0-No pain     Extremity/Trunk Assessment Upper Extremity Assessment Upper Extremity Assessment: Generalized weakness           Communication Communication Communication: No apparent difficulties   Cognition Arousal: Alert Behavior During Therapy: WFL for tasks assessed/performed Cognition: No apparent impairments     Awareness: Intellectual awareness intact   Attention impairment (select first level of impairment): Selective attention                     Following commands: Intact       Cueing  General Comments   Cueing Techniques: Verbal cues  redness in perineal skin folds, RN/MD notified. started on 2L of O2, satting 97-99%, put on RA, stayed above 95% for entirity of OT session, left on RA; notified MD/RN   Exercises     Shoulder Instructions      Home Living Family/patient expects to be discharged to:: Private residence Living Arrangements: Children Available Help at Discharge: Family;Available 24 hours/day Type of Home: Apartment Home Access: Stairs to enter Entrance  Stairs-Number of Steps: 1 Entrance Stairs-Rails: None Home Layout: One level     Bathroom Shower/Tub: Tub/shower unit;Sponge bathes at baseline   [employer]: Handicapped height Bathroom Accessibility: No   Home Equipment: Rollator (4 wheels);BSC/3in1;Shower seat;Wheelchair - manual;Cane - quad;Cane - single point;Adaptive equipment Adaptive Equipment: Reacher Additional Comments: sponge bathes at baseline. sleeps in a recliner at home      Prior Functioning/Environment Prior Level of Function : Needs assist             Mobility Comments: amb in house with rollator, has manual w/c for community distances, sleeps in recliner at home ADLs Comments: Mod I for ADLs, sponge bathing at baseline w/ daughter assisting to wash hair. assist with compression stockings. Daughter manages most IADLs such as meals but pt reports managing own meds.    OT Problem List: Decreased strength;Decreased activity tolerance;Impaired balance (sitting and/or standing);Cardiopulmonary status limiting activity   OT Treatment/Interventions: Self-care/ADL training;Therapeutic exercise;Energy conservation;DME and/or AE instruction;Therapeutic activities;Patient/family education;Balance training      OT Goals(Current goals can be found in the care plan section)   Acute Rehab OT Goals Patient Stated Goal: to feel stronger OT Goal Formulation: With patient Time For Goal Achievement: 05/01/24 Potential to Achieve Goals: Good ADL Goals Pt Will Perform Grooming: with set-up;standing Pt Will Perform Lower Body Dressing: with set-up;with adaptive equipment;sit to/from stand  Pt Will Transfer to Toilet: with modified independence;bedside commode   OT Frequency:  Min 2X/week    Co-evaluation              AM-PAC OT 6 Clicks Daily Activity     Outcome Measure Help from another person eating meals?: None Help from another person taking care of personal grooming?: A Little Help from another person  toileting, which includes using toliet, bedpan, or urinal?: A Lot Help from another person bathing (including washing, rinsing, drying)?: A Lot Help from another person to put on and taking off regular upper body clothing?: A Little Help from another person to put on and taking off regular lower body clothing?: A Lot 6 Click Score: 16   End of Session Equipment Utilized During Treatment: Rolling walker (2 wheels) Nurse Communication: Other (comment) (O2 needs, mobility status, skin inspection)  Activity Tolerance: Patient limited by fatigue Patient left: in bed;with call bell/phone within reach;with bed alarm set  OT Visit Diagnosis: Unsteadiness on feet (R26.81);Muscle weakness (generalized) (M62.81)                Time: 8982-8941 OT Time Calculation (min): 41 min Charges:  OT General Charges $OT Visit: 1 Visit OT Evaluation $OT Eval Low Complexity: 1 Low OT Treatments $Self Care/Home Management : 23-37 mins  Rogers Clause, OT/L MSOT, 04/17/2024

## 2024-04-17 NOTE — Progress Notes (Signed)
 Initial Nutrition Assessment  DOCUMENTATION CODES:   Morbid obesity  INTERVENTION:  - Heart healthy diet per MD. - Ensure Plus High Protein po daily, provides 350 kcal and 20 grams of protein. - Multivitamin with minerals daily   NUTRITION DIAGNOSIS:   Increased nutrient needs related to acute illness as evidenced by estimated needs.  GOAL:   Patient will meet greater than or equal to 90% of their needs  MONITOR:   PO intake, Supplement acceptance, Labs, Weight trends  REASON FOR ASSESSMENT:   Consult Assessment of nutrition requirement/status  ASSESSMENT:   68 y.o. female with PMH significant of recent UTI, history of nephrostomy tube, history of DVT, type 2 diabetes, history of endometrial cancer, history of GAVE, GERD, HLD, essential hypertension, diastolic CHF, history of pulmonary embolism, COPD who presented with significant shortness of breath on exertion.   RD working remotely. Called patient via bedside telephone to obtain nutrition history but unable to reach patient.   Per chart review, weight trended up from August 2024-January 2025. Weight has since trended back down but no significant changes.  Patient currently on a heart healthy diet, no meal intakes documented yet. Will add ONS to support intake.    Medications reviewed and include: -  Labs reviewed:  K+ 3.2 HA1C 4.5 (as of 08/06/23)   NUTRITION - FOCUSED PHYSICAL EXAM:  RD working remotely  Diet Order:   Diet Order             Diet Heart Room service appropriate? Yes; Fluid consistency: Thin  Diet effective now                   EDUCATION NEEDS:  Not appropriate for education at this time  Skin:  Skin Assessment: Reviewed RN Assessment  Last BM:  PTA  Height:  Ht Readings from Last 1 Encounters:  04/16/24 4' 11 (1.499 m)   Weight:  Wt Readings from Last 1 Encounters:  04/16/24 90 kg   Ideal Body Weight:  44.7 kg (*less than 5'0)  BMI:  Body mass index is 40.07  kg/m.  Estimated Nutritional Needs:  Kcal:  1750-1900 kcals Protein:  75-90 grams Fluid:  >/= 1.8L    Trude Ned RD, LDN Contact via Secure Chat.

## 2024-04-18 DIAGNOSIS — J441 Chronic obstructive pulmonary disease with (acute) exacerbation: Secondary | ICD-10-CM | POA: Diagnosis not present

## 2024-04-18 LAB — CBC
HCT: 26.7 % — ABNORMAL LOW (ref 36.0–46.0)
Hemoglobin: 7.8 g/dL — ABNORMAL LOW (ref 12.0–15.0)
MCH: 25.3 pg — ABNORMAL LOW (ref 26.0–34.0)
MCHC: 29.2 g/dL — ABNORMAL LOW (ref 30.0–36.0)
MCV: 86.7 fL (ref 80.0–100.0)
Platelets: 237 K/uL (ref 150–400)
RBC: 3.08 MIL/uL — ABNORMAL LOW (ref 3.87–5.11)
RDW: 17.7 % — ABNORMAL HIGH (ref 11.5–15.5)
WBC: 4.4 K/uL (ref 4.0–10.5)
nRBC: 0 % (ref 0.0–0.2)

## 2024-04-18 LAB — BASIC METABOLIC PANEL WITH GFR
Anion gap: 11 (ref 5–15)
BUN: 18 mg/dL (ref 8–23)
CO2: 24 mmol/L (ref 22–32)
Calcium: 8.8 mg/dL — ABNORMAL LOW (ref 8.9–10.3)
Chloride: 107 mmol/L (ref 98–111)
Creatinine, Ser: 0.82 mg/dL (ref 0.44–1.00)
GFR, Estimated: >60 mL/min (ref >60)
Glucose, Bld: 111 mg/dL — ABNORMAL HIGH (ref 70–99)
Potassium: 4.3 mmol/L (ref 3.5–5.1)
Sodium: 142 mmol/L (ref 135–145)

## 2024-04-18 LAB — MAGNESIUM: Magnesium: 2.1 mg/dL (ref 1.7–2.4)

## 2024-04-18 MED ORDER — SODIUM CHLORIDE 0.9 % IV SOLN
300.0000 mg | Freq: Once | INTRAVENOUS | Status: AC
  Administered 2024-04-18: 300 mg via INTRAVENOUS
  Filled 2024-04-18: qty 15

## 2024-04-18 NOTE — Progress Notes (Signed)
 Mobility Specialist - Progress Note    04/18/24 1539  Mobility  Activity Stood at bedside;Pivoted/transferred from chair to bed  Level of Assistance Minimal assist, patient does 75% or more  Assistive Device Front wheel walker  Distance Ambulated (ft) 3 ft  Range of Motion/Exercises Active  Activity Response Tolerated well  Mobility Referral Yes  Mobility visit 1 Mobility  Mobility Specialist Start Time (ACUTE ONLY) 1528  Mobility Specialist Stop Time (ACUTE ONLY) 1539  Mobility Specialist Time Calculation (min) (ACUTE ONLY) 11 min   Pt resting in recliner on 2L upon entry. Pt STS and transfers back to bed MinA with RW. Pt left in bed with needs in reach and bed alarm activated.   Guido Rumble Mobility Specialist 04/18/24, 3:42 PM

## 2024-04-18 NOTE — Plan of Care (Signed)

## 2024-04-18 NOTE — Progress Notes (Signed)
  PROGRESS NOTE    Courtney Mack  FMW:[id number] DOB: [birth year] DOA: 04/16/2024 PCP: [facility]  125A/125A-AA  LOS: 0 days   Brief hospital course:   Assessment & Plan: Courtney Mack is a 68 y.o. female with medical history significant of recent UTI, history of nephrostomy tube, history of DVT, type 2 diabetes, history of endometrial cancer, history of GAVE, GERD, hyperlipidemia, essential hypertension, diastolic CHF, history of pulmonary embolism, COPD who presents from home with significant shortness of breath on exertion.  Patient was discharged 2 days ago.  During that hospitalization she was treated with complex UTI.  She did better after arriving home.  Patient however started having exertional dyspnea.   Attempted ambulatory trial in the ED, pt's pox down to 87% RA, pt was therefore admitted.   #1 exertional dyspnea:  Brief acute hypoxemic respiratory failure --no strong evidence of PNA or fluid overload.  Procal neg, WBC and BNP wnl.  CT chest no acute finding.  No wheezing to suggest COPD exacerbation.  No more O2 desat the day after admission while working with PT/OT.  DOE may be due to deconditioning. --d/c'ed doxycycline   --frequent mobilization   COPD --no wheezing to suggest exacerbation. --cont home bronchodilators  #2 complex UTI:  Patient is status post nephrostomy tube.  Completed IV antibiotics course on 10/10.  She has scheduled follow-up with infectious disease.     #3 chronic anemia:  Iron  def --anemia workup showed iron  def.  Anemia may contribute to DOE. --IV iron  x1 today   #5 history of DVT and PE:  --cont Lovenox    #7 history of endometrial cancer:  In remission.  Morbid obesity, BMI 40.07   DVT prophylaxis: Lovenox  SQ Code Status: Full code  Family Communication:  Level of care: Med-Surg Dispo:   The patient is from: home Anticipated d/c is to: home Anticipated d/c date is: tomorrow   Subjective and  Interval History:  Pt continued to have DOE.  I encouraged pt to get up and mobilize more.     Objective: Vitals:   04/17/24 0906 04/17/24 2057 04/18/24 0524 04/18/24 0738  BP: (!) 113/43 (!) 125/48 (!) 95/31 (!) 91/47  Pulse: 85 82 89 86  Resp: 16 16  18   Temp: 98.3 F (36.8 C) 98.2 F (36.8 C) 98.7 F (37.1 C) 98 F (36.7 C)  TempSrc:      SpO2: 99% 96% 93% 94%  Weight:      Height:        Intake/Output Summary (Last 24 hours) at 04/18/2024 1601 Last data filed at 04/18/2024 1500 Gross per 24 hour  Intake 960 ml  Output --  Net 960 ml   Filed Weights   04/16/24 1405  Weight: 90 kg    Examination:   Constitutional: NAD, AAOx3 HEENT: conjunctivae and lids normal, EOMI CV: No cyanosis.   RESP: normal respiratory effort at rest, on RA Neuro: II - XII grossly intact.     Data Reviewed: I have personally reviewed labs and imaging studies  Time spent: 50 minutes  Ellouise Haber, MD [facility] If 7PM-7AM, please contact night-coverage 04/18/2024, 4:01 PM

## 2024-04-18 NOTE — Progress Notes (Signed)
 Mobility Specialist - Progress Note     04/18/24 1330  Mobility  Activity Ambulated with assistance;Stood at bedside  Level of Assistance Contact guard assist, steadying assist  Assistive Device Front wheel walker  Distance Ambulated (ft) 5 ft  Range of Motion/Exercises Active  Activity Response Tolerated well  Mobility Referral Yes  Mobility visit 1 Mobility  Mobility Specialist Start Time (ACUTE ONLY) 1317  Mobility Specialist Stop Time (ACUTE ONLY) 1330  Mobility Specialist Time Calculation (min) (ACUTE ONLY) 13 min   Pt resting in recliner on 2L upon entry. Pt STS and takes several steps forward and backward at bedside CGA with RW. Pt endorses SOB and recovered after several minutes of deep breathing. Pt returned to recliner and left with needs in reach.   Guido Rumble Mobility Specialist 04/18/24, 1:41 PM

## 2024-04-19 DIAGNOSIS — J441 Chronic obstructive pulmonary disease with (acute) exacerbation: Secondary | ICD-10-CM | POA: Diagnosis not present

## 2024-04-19 LAB — URINE CULTURE: Culture: >=100000 — AB

## 2024-04-19 LAB — HIV ANTIBODY (ROUTINE TESTING W REFLEX): HIV Screen 4th Generation wRfx: NONREACTIVE

## 2024-04-19 NOTE — Progress Notes (Signed)
 Occupational Therapy Treatment Patient Details Name: Courtney Mack MRN: [id number] DOB: [birth year] Today's Date: 04/19/2024   History of present illness Pt is a 68 y/o F admitted on 04/16/24 after presenting with c/o SOB. Pt was d/c 2 days prior & treated for complex UTI. DOE possibly 2/2 COPD exacerbation vs CHF. PMH: UTI, nephrostomy tube, DVT, DM2, endometrial CA, GAVE, GERD, HLD, HTN, dCHF, PE, COPD   OT comments  Patient seen for OT treatment on this date. Upon arrival to room patient patient sitting in recliner comfortably, agreeable to treatment. Focus of treatment was improving standing tolerance for ADLs, patient performed sit<>stand with CGA and ambulated 10 feet to sink, needed seated rest break, HR 118 O2 98%. Patient performed standing at sink for 2-3 minutes with SBA/CGA to perform grooming tasks, took seated rest break HR 105 and O2 94%. Patient ambulated back to recliner, HR 120 and O2 99%. HR returned to <100 after 30 seconds.  Patient ended treatment in chair with chair alarm on and all needs within reach. Patient making good progress toward goals, will continue to follow POC. Discharge recommendation remains appropriate.        If plan is discharge home, recommend the following:  A lot of help with walking and/or transfers;A lot of help with bathing/dressing/bathroom;Assistance with cooking/housework;Assist for transportation   Equipment Recommendations  None recommended by OT    Recommendations for Other Services      Precautions / Restrictions Precautions Precautions: Fall Recall of Precautions/Restrictions: Intact Precaution/Restrictions Comments: L nephrostomy tube Restrictions Weight Bearing Restrictions Per Provider Order: No       Mobility Bed Mobility                    Transfers Overall transfer level: Needs assistance Equipment used: Rolling walker (2 wheels) Transfers: Sit to/from Stand Sit to Stand: Min assist, Contact guard assist            General transfer comment: sit<>stand from bed at lowest position with min A, from recliner, she requires CGA     Balance Overall balance assessment: Needs assistance         Standing balance support: During functional activity Standing balance-Leahy Scale: Fair                             ADL either performed or assessed with clinical judgement   ADL       Grooming: Wash/dry hands;Wash/dry face;Oral care;Contact guard assist;Standing                               Functional mobility during ADLs: Contact guard assist      Extremity/Trunk Assessment Upper Extremity Assessment Upper Extremity Assessment: Generalized weakness   Lower Extremity Assessment Lower Extremity Assessment: Generalized weakness        Vision       Perception     Praxis     Communication Communication Communication: No apparent difficulties   Cognition Arousal: Alert Behavior During Therapy: WFL for tasks assessed/performed Cognition: No apparent impairments     Awareness: Intellectual awareness intact                         Following commands: Intact        Cueing   Cueing Techniques: Verbal cues  Exercises      Shoulder Instructions  General Comments remained on RA for entire visit, O2 stayed above 92%    Pertinent Vitals/ Pain       Pain Assessment Pain Assessment: No/denies pain Pain Score: 0-No pain  Home Living                                          Prior Functioning/Environment              Frequency  Min 2X/week        Progress Toward Goals  OT Goals(current goals can now be found in the care plan section)  Progress towards OT goals: Progressing toward goals  Acute Rehab OT Goals Patient Stated Goal: to be able to breathe better when I walk. OT Goal Formulation: With patient Time For Goal Achievement: 05/01/24 Potential to Achieve Goals: Good ADL Goals Pt Will  Perform Grooming: with set-up;standing Pt Will Perform Lower Body Bathing: with modified independence;sit to/from stand;sitting/lateral leans Pt Will Perform Lower Body Dressing: with set-up;with adaptive equipment;sit to/from stand Pt Will Transfer to Toilet: with modified independence;bedside commode Pt Will Perform Toileting - Clothing Manipulation and hygiene: with modified independence;sit to/from stand;sitting/lateral leans  Plan      Co-evaluation                 AM-PAC OT 6 Clicks Daily Activity     Outcome Measure   Help from another person eating meals?: None Help from another person taking care of personal grooming?: A Little Help from another person toileting, which includes using toliet, bedpan, or urinal?: A Lot Help from another person bathing (including washing, rinsing, drying)?: A Lot Help from another person to put on and taking off regular upper body clothing?: A Little Help from another person to put on and taking off regular lower body clothing?: A Lot 6 Click Score: 16    End of Session Equipment Utilized During Treatment: Rolling walker (2 wheels)  OT Visit Diagnosis: Unsteadiness on feet (R26.81);Muscle weakness (generalized) (M62.81)   Activity Tolerance Patient limited by fatigue   Patient Left in chair;with call bell/phone within reach;with chair alarm set   Nurse Communication          Time: 8891-8872 OT Time Calculation (min): 19 min  Charges: OT General Charges $OT Visit: 1 Visit OT Treatments $Self Care/Home Management : 8-22 mins  Rogers Clause, OT/L MSOT, 04/19/2024   Courtney Mack CHRISTELLA Clause 04/19/2024, 11:38 AM

## 2024-04-19 NOTE — Care Management Obs Status (Signed)
 MEDICARE OBSERVATION STATUS NOTIFICATION   Patient Details  Name: Courtney Mack MRN: [id number] Date of Birth: September 22, 1955   Medicare Observation Status Notification Given:  Chaney BRANDY CHRISTIANE LELON, CMA 04/19/2024, 11:48 AM

## 2024-04-19 NOTE — Progress Notes (Signed)
 Physical Therapy Treatment Patient Details Name: Courtney Mack MRN: [id number] DOB: 15-Mar-1956 Today's Date: 04/19/2024   History of Present Illness Pt is a 68 y/o F admitted on 04/16/24 after presenting with c/o SOB. Pt was d/c 2 days prior & treated for complex UTI. DOE possibly 2/2 COPD exacerbation vs CHF. PMH: UTI, nephrostomy tube, DVT, DM2, endometrial CA, GAVE, GERD, HLD, HTN, dCHF, PE, COPD    PT Comments  Pt's mobility is progressing with overall decreased assist performing at a Min A to CGA level.  Pt progressed with gait distance with RW (10+8 ft )  chair follow for safety, rest breaks PRN. Pt able to perform all mobility on RA SpO2 92-94, HR 91-121bpm.  Mobility continues to be limited 2/2 poor activity tolerance and general weakness.  Continue to recommend post acute rehab <3 hours therapy/day upon d/c.     If plan is discharge home, recommend the following: A little help with walking and/or transfers;A little help with bathing/dressing/bathroom;Assistance with cooking/housework;Assist for transportation;Help with stairs or ramp for entrance   Can travel by private vehicle     No  Equipment Recommendations  None recommended by PT    Recommendations for Other Services       Precautions / Restrictions Precautions Precautions: Fall Recall of Precautions/Restrictions: Intact Precaution/Restrictions Comments: L nephrostomy tube Restrictions Weight Bearing Restrictions Per Provider Order: No     Mobility  Bed Mobility Overal bed mobility: Needs Assistance Bed Mobility: Supine to Sit     Supine to sit: HOB elevated, Used rails, Supervision     General bed mobility comments: increased time needed.    Transfers Overall transfer level: Needs assistance Equipment used: Rolling walker (2 wheels) Transfers: Sit to/from Stand, Bed to chair/wheelchair/BSC Sit to Stand: Min assist   Step pivot transfers: Min assist       General transfer comment: Pt wants to  pull on RW to stand but is able to transfers sit>stand from recliner with ability to push to standing, cuing to transition hands from recliner to RW.    Ambulation/Gait Ambulation/Gait assistance: Contact guard assist Gait Distance (Feet): 10 Feet (+8) Assistive device: Rolling walker (2 wheels) Gait Pattern/deviations: Decreased step length - right, Decreased step length - left, Decreased stride length, Decreased dorsiflexion - left, Decreased dorsiflexion - right Gait velocity: decreased     General Gait Details: chair follow for safety, rest breaks PRN.   Pt able to perform all mobility on RA SpO2 92-94, HR 91-121bpm   Stairs             Wheelchair Mobility     Tilt Bed    Modified Rankin (Stroke Patients Only)       Balance Overall balance assessment: Needs assistance Sitting-balance support: Feet supported Sitting balance-Leahy Scale: Good Sitting balance - Comments: dynamic sitting EOB with supervision   Standing balance support: During functional activity, Bilateral upper extremity supported, Reliant on assistive device for balance Standing balance-Leahy Scale: Fair                              Hotel manager: No apparent difficulties  Cognition Arousal: Alert Behavior During Therapy: WFL for tasks assessed/performed   PT - Cognitive impairments: No apparent impairments                       PT - Cognition Comments: pleasant Following commands: Intact      Cueing Cueing  Techniques: Verbal cues  Exercises      General Comments        Pertinent Vitals/Pain Pain Assessment Pain Score: 3  Pain Location: chest Pain Descriptors / Indicators: Discomfort Pain Intervention(s): Limited activity within patient's tolerance, Monitored during session    Home Living                          Prior Function            PT Goals (current goals can now be found in the care plan section) Acute  Rehab PT Goals Patient Stated Goal: get better PT Goal Formulation: With patient Time For Goal Achievement: 05/01/24 Potential to Achieve Goals: Good Progress towards PT goals: Progressing toward goals    Frequency    Min 2X/week      PT Plan      Co-evaluation              AM-PAC PT 6 Clicks Mobility   Outcome Measure  Help needed turning from your back to your side while in a flat bed without using bedrails?: A Little Help needed moving from lying on your back to sitting on the side of a flat bed without using bedrails?: A Lot Help needed moving to and from a bed to a chair (including a wheelchair)?: A Little Help needed standing up from a chair using your arms (e.g., wheelchair or bedside chair)?: A Little Help needed to walk in hospital room?: A Little Help needed climbing 3-5 steps with a railing? : A Lot 6 Click Score: 16    End of Session Equipment Utilized During Treatment: Gait belt Activity Tolerance: Patient tolerated treatment well;Patient limited by fatigue Patient left: in chair;with call bell/phone within reach;with bed alarm set Nurse Communication: Mobility status PT Visit Diagnosis: Muscle weakness (generalized) (M62.81);Other abnormalities of gait and mobility (R26.89);Difficulty in walking, not elsewhere classified (R26.2);Unsteadiness on feet (R26.81)     Time: 9061-8994 PT Time Calculation (min) (ACUTE ONLY): 27 min  Charges:    $Therapeutic Activity: 23-37 mins PT General Charges $$ ACUTE PT VISIT: 1 Visit                     Harland Irving, PTA  04/19/24, 10:21 AM

## 2024-04-19 NOTE — Progress Notes (Signed)
  PROGRESS NOTE    ANALYSSA Mack  FMW:[id number] DOB: [birth year] DOA: 04/16/2024 P[facility]: [facility]  125A/125A-AA  LOS: 0 days   Brief hospital course:   Assessment & Plan: Courtney Mack is a 68 y.o. female with medical history significant of recent UTI, history of nephrostomy tube, history of DVT, type 2 diabetes, history of endometrial cancer, history of GAVE, GERD, hyperlipidemia, essential hypertension, diastolic CHF, history of pulmonary embolism, COPD who presents from home with significant shortness of breath on exertion.  Patient was discharged 2 days ago.  During that hospitalization she was treated with complex UTI.  She did better after arriving home.  Patient however started having exertional dyspnea.   Attempted ambulatory trial in the ED, pt's pox down to 87% RA, pt was therefore admitted.   #1 exertional dyspnea:  Brief acute hypoxemic respiratory failure --no strong evidence of PNA or fluid overload.  Procal neg, WBC and BNP wnl.  CT chest no acute finding.  No wheezing to suggest COPD exacerbation.  No more O2 desat the day after admission while working with PT/OT.  DOE may be due to deconditioning. --d/c'ed doxycycline   --PT/OT and frequent mobilization  COPD --no wheezing to suggest exacerbation. --cont home bronchodilators  #2 complex UTI:  Patient is status post nephrostomy tube.  Completed IV antibiotics course on 10/10.  She has scheduled follow-up with infectious disease.     #3 chronic anemia:  Iron  def --anemia workup showed iron  def.  Anemia may contribute to DOE. --s/p IV iron  x1 --oral iron  suppl   #5 history of DVT and PE:  --cont Lovenox    #7 history of endometrial cancer:  In remission.  Morbid obesity, BMI 40.07   DVT prophylaxis: Lovenox  SQ Code Status: Full code  Family Communication:  Level of care: Med-Surg Dispo:   The patient is from: home Anticipated d/c is to: SNF rehab Anticipated d/c date  is: whenever bed available   Subjective and Interval History:  Pt reported doing better with PT today, however, still felt significant DOE, therefore, was rec for SNF rehab.   Objective: Vitals:   04/19/24 0407 04/19/24 0846 04/19/24 1616 04/19/24 1952  BP: (!) 109/44 (!) 124/50 (!) 113/46 (!) 116/56  Pulse: 98 85 81 83  Resp: 16 19 18 16   Temp: 98 F (36.7 C) 98.1 F (36.7 C) 98.7 F (37.1 C) 98.6 F (37 C)  TempSrc: Oral   Oral  SpO2: 93% 93% 97% 96%  Weight:      Height:        Intake/Output Summary (Last 24 hours) at 04/19/2024 2245 Last data filed at 04/19/2024 2000 Gross per 24 hour  Intake 480 ml  Output 800 ml  Net -320 ml   Filed Weights   04/16/24 1405  Weight: 90 kg    Examination:   Constitutional: NAD, AAOx3 HEENT: conjunctivae and lids normal, EOMI CV: No cyanosis.   RESP: normal respiratory effort, on RA Neuro: II - XII grossly intact.     Data Reviewed: I have personally reviewed labs and imaging studies  Time spent: 35 minutes  Ellouise Haber, MD [facility] If 7PM-7AM, please contact night-coverage 04/19/2024, 10:45 PM

## 2024-04-19 NOTE — Plan of Care (Signed)
  Problem: Activity: Goal: Risk for activity intolerance will decrease Outcome: Progressing   Problem: Clinical Measurements: Goal: Respiratory complications will improve Outcome: Progressing   Problem: Skin Integrity: Goal: Risk for impaired skin integrity will decrease Outcome: Progressing

## 2024-04-20 DIAGNOSIS — J441 Chronic obstructive pulmonary disease with (acute) exacerbation: Secondary | ICD-10-CM | POA: Diagnosis not present

## 2024-04-20 MED ORDER — ADULT MULTIVITAMIN W/MINERALS CH: 1.0000 | ORAL_TABLET | Freq: Every day | ORAL | Status: AC

## 2024-04-20 MED ORDER — LOPERAMIDE HCL 2 MG PO CAPS
2.0000 mg | ORAL_CAPSULE | Freq: Once | ORAL | Status: AC
  Administered 2024-04-20: 2 mg via ORAL
  Filled 2024-04-20: qty 1

## 2024-04-20 NOTE — Plan of Care (Signed)

## 2024-04-20 NOTE — Discharge Summary (Signed)
 Physician Discharge Summary   Courtney Mack  female DOB: 02/11/1956  FMW:[id number]  PCP: [facility]  Admit date: 04/16/2024 Discharge date: 04/20/2024  Admitted From: home Disposition:  home.  Pt refused SNF rehab. Home Health: Yes CODE STATUS: Full code  Discharge Instructions     No wound care   Complete by: As directed       Hospital Course:  For full details, please see H&P, progress notes, consult notes and ancillary notes.  Briefly,  Courtney Mack is a 68 y.o. female with medical history significant of recent UTI, nephrostomy tube, history of DVT, type 2 diabetes, history of endometrial cancer, history of GAVE, essential hypertension, diastolic CHF, history of pulmonary embolism, COPD who presented from home with significant shortness of breath on exertion.    Patient was discharged 2 days ago.  During that hospitalization she was treated with complex UTI.  She initially did better after arriving home.  Patient however started having exertional dyspnea.    Attempted ambulatory trial in the ED, pt's pox down to 87% RA, pt was therefore admitted.   #1 exertional dyspnea:  Brief acute hypoxemic respiratory failure --no strong evidence of PNA or fluid overload.  Procal neg, WBC and BNP wnl.  CT chest no acute finding.  No wheezing to suggest COPD exacerbation.  No more O2 desat the day after admission while working with PT/OT.  DOE may be due to deconditioning. --d/c'ed doxycycline   --PT/OT and frequent mobilization --DOE improved prior to discharge, pt asked to go home.   COPD --no wheezing to suggest exacerbation. --cont home bronchodilators   #2 complex UTI:  Patient is status post nephrostomy tube.  Completed IV antibiotics course on 10/10.  She has scheduled follow-up with infectious disease.   --Pt is on trimethoprim  for ppx, was started (Feb 2025) by attending after discussing with urologist.    #3 chronic anemia:  Iron   def --anemia workup showed iron  def.  Anemia may contribute to DOE.  Pt has been getting IV iron  with outpatient Hem/onc --s/p IV iron  x1 during this hospitalization.   #5 history of DVT and PE:  --cont home Lovenox    #7 history of endometrial cancer:  In remission.   Morbid obesity, BMI 40.07   Discharge Diagnoses:  Principal Problem:   COPD exacerbation (HCC) Active Problems:   Nephrostomy status (HCC)   Hypertension   Obesity, Class III, BMI 40-49.9 (morbid obesity) (HCC)   Dyslipidemia   IDA (iron  deficiency anemia)   Chronic diastolic CHF (congestive heart failure) (HCC)   Generalized weakness   Chronic deep vein thrombosis (DVT) of proximal vein of right lower extremity (HCC)   30 Day Unplanned Readmission Risk Score    Flowsheet Row ED to Hosp-Admission (Discharged) from [facility]  30 Day Unplanned Readmission Risk Score (%) 19.95 Filed at 04/14/2024 1200    This score is the patient's risk of an unplanned readmission within 30 days of being discharged (0 -100%). The score is based on dignosis, age, lab data, medications, orders, and past utilization.   Low:  0-14.9   Medium: 15-21.9   High: 22-29.9   Extreme: 30 and above         Discharge Instructions:  Allergies as of 04/20/2024   No Known Allergies      Medication List     TAKE these medications    acetaminophen  500 MG tablet Commonly known as: TYLENOL  Take 500 mg by  mouth every 6 (six) hours as needed for mild pain or fever.   albuterol  108 (90 Base) MCG/ACT inhaler Commonly known as: VENTOLIN  HFA Inhale 2 puffs into the lungs every 6 (six) hours as needed for wheezing or shortness of breath. What changed: when to take this   Breo Ellipta  100-25 MCG/ACT Aepb Generic drug: fluticasone  furoate-vilanterol Inhale 1 puff into the lungs daily.   cyanocobalamin  100 MCG tablet Take 100 mcg by mouth daily.   enoxaparin  30 MG/0.3ML injection Commonly known as:  LOVENOX  Inject 0.3 mLs (30 mg total) into the skin every 12 (twelve) hours.   furosemide  20 MG tablet Commonly known as: LASIX  Take 20 mg by mouth daily.   loperamide  2 MG capsule Commonly known as: IMODIUM  Take 2 mg by mouth as needed for diarrhea or loose stools.   multivitamin with minerals Tabs tablet Take 1 tablet by mouth daily. Start taking on: April 21, 2024   pantoprazole  40 MG tablet Commonly known as: PROTONIX  Take 40 mg by mouth 2 (two) times daily.   rosuvastatin  10 MG tablet Commonly known as: CRESTOR  TAKE 1 TABLET(10 MG) BY MOUTH DAILY   simethicone  80 MG chewable tablet Commonly known as: MYLICON Chew 1 tablet (80 mg total) by mouth every 6 (six) hours as needed for flatulence.   trimethoprim  100 MG tablet Commonly known as: TRIMPEX  Take 1 tablet (100 mg total) by mouth at bedtime.   vitamin C  1000 MG tablet Take 1,000 mg by mouth daily.         Follow-up Information     [facility] Follow up.   Specialty: General Practice Why: Hospital follow up Contact information: [address] [place] [address] [phone]                 No Known Allergies   The results of significant diagnostics from this hospitalization (including imaging, microbiology, ancillary and laboratory) are listed below for reference.   Consultations:   Procedures/Studies: CT Angio Chest PE W and/or Wo Contrast Result Date: 04/16/2024 CLINICAL DATA:  Worsening shortness of breath EXAM: CT ANGIOGRAPHY CHEST WITH CONTRAST TECHNIQUE: Multidetector CT imaging of the chest was performed using the standard protocol during bolus administration of intravenous contrast. Multiplanar CT image reconstructions and MIPs were obtained to evaluate the vascular anatomy. RADIATION DOSE REDUCTION: This exam was performed according to the departmental dose-optimization program which includes automated exposure control, adjustment of the mA  and/or kV according to patient size and/or use of iterative reconstruction technique. CONTRAST:  75mL OMNIPAQUE  IOHEXOL  350 MG/ML SOLN COMPARISON:  08/04/2023 FINDINGS: Cardiovascular: Heart is normal size. Aorta is normal caliber. No filling defects in the pulmonary arteries to suggest pulmonary emboli. Mediastinum/Nodes: No mediastinal, hilar, or axillary adenopathy. Trachea and esophagus are unremarkable. Thyroid unremarkable. Lungs/Pleura: Linear atelectasis in the lower lobes. No effusions. Mosaic attenuation throughout the lungs again noted similar to prior study compatible with areas of air trapping. Upper Abdomen: No acute findings Musculoskeletal: Chest wall soft tissues are unremarkable. No acute bony abnormality. Review of the MIP images confirms the above findings. IMPRESSION: No evidence of pulmonary embolus. Areas of atelectasis in the lower lobes with air trapping throughout the lungs, similar to prior study. No acute cardiopulmonary disease. Electronically Signed   By: Franky Crease M.D.   On: 04/16/2024 17:25   DG Chest 1 View Result Date: 04/16/2024 CLINICAL DATA:  Shortness of breath with exertion. EXAM: CHEST  1 VIEW COMPARISON:  April 07, 2024 FINDINGS:  The heart size and mediastinal contours are within normal limits. Low lung volumes are noted. Mildly increased interstitial lung markings are seen. No acute infiltrate, pleural effusion or pneumothorax is identified. The visualized skeletal structures are unremarkable. IMPRESSION: Low lung volumes with mildly increased interstitial lung markings. A very mild component of interstitial edema cannot be excluded. Electronically Signed   By: Suzen Dials M.D.   On: 04/16/2024 14:41   DG Chest 1 View Result Date: 04/07/2024 EXAM: 1 VIEW(S) XRAY OF THE CHEST 04/07/2024 07:24:12 PM COMPARISON: 08/27/2023 CLINICAL HISTORY: Pulmonary edema FINDINGS: LUNGS AND PLEURA: Low lung volumes. No focal pulmonary opacity. No pulmonary edema. No pleural  effusion. No pneumothorax. HEART AND MEDIASTINUM: Aortic atherosclerosis. No acute abnormality of the cardiac and mediastinal silhouettes. BONES AND SOFT TISSUES: No acute osseous abnormality. IMPRESSION: 1. No acute abnormalities. Electronically signed by: Norman Gatlin MD 04/07/2024 09:29 PM EDT RP Workstation: [id number]   CT ABDOMEN PELVIS W CONTRAST Result Date: 04/06/2024 CLINICAL DATA:  68 year old female with left flank, back, abdominal pain. Nephrostomy tube with purulent drainage, erythema. EXAM: CT ABDOMEN AND PELVIS WITH CONTRAST TECHNIQUE: Multidetector CT imaging of the abdomen and pelvis was performed using the standard protocol following bolus administration of intravenous contrast. RADIATION DOSE REDUCTION: This exam was performed according to the departmental dose-optimization program which includes automated exposure control, adjustment of the mA and/or kV according to patient size and/or use of iterative reconstruction technique. CONTRAST:  OMNIPAQUE  IOHEXOL  300 MG/ML  SOLN COMPARISON:  CT Abdomen and Pelvis 07/24/2023. Report of nephrostomy catheter exchange 03/05/2024. FINDINGS: Lower chest: Stable elevation of the right hemidiaphragm, probable normal variant. Calcified coronary artery atherosclerosis. Heart size remains normal. No pericardial or pleural effusion. Lung base atelectasis and scarring is stable, maximal in the lingula and the right lower lobe. Hepatobiliary: Negative liver and gallbladder. Pancreas: Negative. Spleen: Stable, negative. Adrenals/Urinary Tract: Normal adrenal glands. Previously seen right renal lower pole calculus now located in the central renal pelvis and measures 6 mm. But no right hydronephrosis and renal contrast excretion appears maintained on the delayed images. Diminutive right ureter, negative to the bladder. Percutaneous left nephrostomy in place with superimposed left double-J ureteral stent. Catheters appear appropriately position. Moderate  inflammatory stranding throughout the left renal pelvis, surrounding the left ureter, and surrounding the decompressed urinary bladder where the distal pigtail terminates. No gas or fluid tracking along the percutaneous catheter, the left flank body wall appears stable. No left nephrogram striations to indicate overt pyelonephritis. No pararenal abscess or fluid. Stomach/Bowel: Mildly increased retained stool in the large bowel, otherwise stable bowel in the abdomen and pelvis. Appendix remains normal on series 3, image 65. No dilated bowel loops. Chronic rectus muscle diastasis, no bowel hernia. Decompressed stomach and duodenum. No pneumoperitoneum or free fluid. Vascular/Lymphatic: Aortoiliac calcified atherosclerosis. Major arterial structures in the abdomen and pelvis remain patent. Normal caliber abdominal aorta. IVC filter is stable. Portal venous system appears patent. Small but progressed left renal retroperitoneal lymph nodes series 3, image 46, up to 8 mm short axis now (previously 5 mm and smaller). Retroaortic left renal vein incidentally noted, patent. Reproductive: Stable, negative. Other: Chronic presacral stranding appears stable. No pelvic free fluid. Musculoskeletal: Lumbar spine facet arthropathy, some levels of facet ankylosis. Lower thoracic spine Diffuse idiopathic skeletal hyperostosis (DISH) and subsequent interbody ankylosis. Chronic severe hip joint degeneration, femoral head remodeling. No acute osseous abnormality identified. IMPRESSION: 1. Left percutaneous nephrostomy and double-J ureteral stent in place with satisfactory positioning. Left renal collecting system, renal  hilum, left ureter, and bladder appear acutely inflamed. But no associated abscess, fluid collection, and no overt left pyelonephritis. Adjacent small but reactive appearing left retroperitoneal lymph nodes. 2. Right renal 6 mm calculus now located centrally in the right renal pelvis. But no findings of acute  obstructive uropathy. 3. Otherwise stable since January CT appearance of the Abdomen and Pelvis. Aortic Atherosclerosis (ICD10-I70.0). Electronically Signed   By: VEAR Hurst M.D.   On: 04/06/2024 11:12      Labs: BNP (last 3 results) Recent Labs    08/04/23 1614 08/27/23 1820 04/16/24 1533  BNP 85.4 59.2 54.2   Basic Metabolic Panel: Recent Labs  Lab 04/16/24 1407 04/17/24 0500 04/18/24 0546  NA 143 140 142  K 3.7 3.2* 4.3  CL 104 107 107  CO2 23 26 24   GLUCOSE 113* 107* 111*  BUN 12 13 18   CREATININE 0.87 0.99 0.82  CALCIUM  8.5* 8.2* 8.8*  MG  --   --  2.1   Liver Function Tests: Recent Labs  Lab 04/17/24 0500  AST 19  ALT 8  ALKPHOS 46  BILITOT 0.6  PROT 6.0*  ALBUMIN 2.4*   No results for input(s): LIPASE, AMYLASE in the last 168 hours. No results for input(s): AMMONIA in the last 168 hours. CBC: Recent Labs  Lab 04/16/24 1407 04/17/24 0500 04/18/24 0546  WBC 4.9 4.5 4.4  HGB 8.7* 7.7* 7.8*  HCT 29.7* 25.7* 26.7*  MCV 88.4 86.5 86.7  PLT 248 249 237   Cardiac Enzymes: No results for input(s): CKTOTAL, CKMB, CKMBINDEX, TROPONINI in the last 168 hours. BNP: Invalid input(s): POCBNP CBG: No results for input(s): GLUCAP in the last 168 hours. D-Dimer No results for input(s): DDIMER in the last 72 hours. Hgb A1c No results for input(s): HGBA1C in the last 72 hours. Lipid Profile No results for input(s): CHOL, HDL, LDLCALC, TRIG, CHOLHDL, LDLDIRECT in the last 72 hours. Thyroid function studies No results for input(s): TSH, T4TOTAL, T3FREE, THYROIDAB in the last 72 hours.  Invalid input(s): FREET3 Anemia work up No results for input(s): VITAMINB12, FOLATE, FERRITIN, TIBC, IRON , RETICCTPCT in the last 72 hours. Urinalysis    Component Value Date/Time   COLORURINE YELLOW (A) 04/16/2024 1517   APPEARANCEUR CLOUDY (A) 04/16/2024 1517   APPEARANCEUR Cloudy (A) 01/03/2022 1443   LABSPEC 1.018  04/16/2024 1517   PHURINE 6.0 04/16/2024 1517   GLUCOSEU NEGATIVE 04/16/2024 1517   HGBUR NEGATIVE 04/16/2024 1517   BILIRUBINUR NEGATIVE 04/16/2024 1517   BILIRUBINUR Negative 01/03/2022 1443   KETONESUR NEGATIVE 04/16/2024 1517   PROTEINUR 100 (A) 04/16/2024 1517   NITRITE NEGATIVE 04/16/2024 1517   LEUKOCYTESUR LARGE (A) 04/16/2024 1517   Sepsis Labs Recent Labs  Lab 04/16/24 1407 04/17/24 0500 04/18/24 0546  WBC 4.9 4.5 4.4   Microbiology Recent Results (from the past 240 hours)  Urine Culture     Status: Abnormal   Collection Time: 04/16/24  3:17 PM   Specimen: Urine, Clean Catch  Result Value Ref Range Status   Specimen Description   Final    URINE, CLEAN CATCH Performed at [facility], [address], [place], [place] [address]    Special Requests   Final    NONE Performed at [facility], [address] [place], [place] [address]    Culture (A)  Final    >=100,000 COLONIES/mL GRAM POSITIVE COCCI Leuconostoc mesenteroides 70,000 COLONIES/mL YEAST Standardized susceptibility testing for this organism is not available. Leuconostoc mesenteroides Performed at [facility], [address] [place], [place] [address]    Report Status 04/19/2024 FINAL  Final  Resp panel by RT-PCR (RSV, Flu A&B, Covid) Anterior Nasal Swab     Status: None   Collection Time: 04/16/24  3:33 PM   Specimen: Anterior Nasal Swab  Result Value Ref Range Status   SARS Coronavirus 2 by RT PCR NEGATIVE NEGATIVE Final    Comment: (NOTE) SARS-CoV-2 target nucleic acids are NOT DETECTED.  The SARS-CoV-2 RNA is generally detectable in upper respiratory specimens during the acute phase of infection. The lowest concentration of SARS-CoV-2 viral copies this assay can detect is 138 copies/mL. A negative result does not preclude SARS-Cov-2 infection and should not be used as the sole basis for treatment or other patient management decisions. A negative result may occur  with  improper specimen collection/handling, submission of specimen other than nasopharyngeal swab, presence of viral mutation(s) within the areas targeted by this assay, and inadequate number of viral copies(<138 copies/mL). A negative result must be combined with clinical observations, patient history, and epidemiological information. The expected result is Negative.  Fact Sheet for Patients:  BloggerCourse.com  Fact Sheet for Healthcare Providers:  SeriousBroker.it  This test is no t yet approved or cleared by the [place]  FDA and  has been authorized for detection and/or diagnosis of SARS-CoV-2 by FDA under an Emergency Use Authorization (EUA). This EUA will remain  in effect (meaning this test can be used) for the duration of the COVID-19 declaration under Section 564(b)(1) of the Act, 21 U.S.C.section 360bbb-3(b)(1), unless the authorization is terminated  or revoked sooner.       Influenza A by PCR NEGATIVE NEGATIVE Final   Influenza B by PCR NEGATIVE NEGATIVE Final    Comment: (NOTE) The Xpert Xpress SARS-CoV-2/FLU/RSV plus assay is intended as an aid in the diagnosis of influenza from Nasopharyngeal swab specimens and should not be used as a sole basis for treatment. Nasal washings and aspirates are unacceptable for Xpert Xpress SARS-CoV-2/FLU/RSV testing.  Fact Sheet for Patients: BloggerCourse.com  Fact Sheet for Healthcare Providers: SeriousBroker.it  This test is not yet approved or cleared by the [place]  FDA and has been authorized for detection and/or diagnosis of SARS-CoV-2 by FDA under an Emergency Use Authorization (EUA). This EUA will remain in effect (meaning this test can be used) for the duration of the COVID-19 declaration under Section 564(b)(1) of the Act, 21 U.S.C. section 360bbb-3(b)(1), unless the authorization is terminated  or revoked.     Resp Syncytial Virus by PCR NEGATIVE NEGATIVE Final    Comment: (NOTE) Fact Sheet for Patients: BloggerCourse.com  Fact Sheet for Healthcare Providers: SeriousBroker.it  This test is not yet approved or cleared by the [place]  FDA and has been authorized for detection and/or diagnosis of SARS-CoV-2 by FDA under an Emergency Use Authorization (EUA). This EUA will remain in effect (meaning this test can be used) for the duration of the COVID-19 declaration under Section 564(b)(1) of the Act, 21 U.S.C. section 360bbb-3(b)(1), unless the authorization is terminated or revoked.  Performed at [facility], [address] [place], [place] [address]      Total time spend on discharging this patient, including the last patient exam, discussing the hospital stay, instructions for ongoing care as it relates to all pertinent caregivers, as well as preparing the medical discharge records, prescriptions, and/or referrals as applicable, is 35 minutes.    Ellouise Haber, MD  [facility] 04/20/2024, 2:01 PM

## 2024-04-20 NOTE — Progress Notes (Signed)
 Occupational Therapy Treatment Patient Details Name: Courtney Mack MRN: [id number] DOB: [birth year] Today's Date: 04/20/2024   History of present illness Pt is a 68 y/o F admitted on 04/16/24 after presenting with c/o SOB. Pt was d/c 2 days prior & treated for complex UTI. DOE possibly 2/2 COPD exacerbation vs CHF. PMH: UTI, nephrostomy tube, DVT, DM2, endometrial CA, GAVE, GERD, HLD, HTN, dCHF, PE, COPD   OT comments  Patient seen for OT treatment on this date. Upon arrival to room patient semi fowlers in bed, agreeable to treatment. Patient reports she is going home today, OT communicated with TOC for recommendations. Patient reports I can dress myself. OT provided socks to patient and instructed to don, she reports I can't reach my feet, I can only do my upper body. Patient goes on to report that her daughter will be helping her with all LB self care tasks. After OT donned socks, patient reports needing to have BM, upon standing, patient already had incontinence. Patient sat on BSC with SBA and finished toileting. She required max A to complete perineal hygiene. Patient ambulated 3-5 feet to recliner with rolling walker with SBA and transitioned to recliner with SBA. Patient ended treatment in recliner with chair alarm on and all needs within reach. Patient making good progress toward goals, will continue to follow POC. Discharge recommendation remains appropriate.        If plan is discharge home, recommend the following:  A lot of help with walking and/or transfers;A lot of help with bathing/dressing/bathroom;Assistance with cooking/housework;Assist for transportation   Equipment Recommendations  None recommended by OT    Recommendations for Other Services      Precautions / Restrictions Precautions Precautions: Fall Recall of Precautions/Restrictions: Intact Precaution/Restrictions Comments: L nephrostomy tube Restrictions Weight Bearing Restrictions Per Provider Order: No        Mobility Bed Mobility Overal bed mobility: Needs Assistance Bed Mobility: Supine to Sit     Supine to sit: HOB elevated, Used rails, Mod assist          Transfers Overall transfer level: Needs assistance Equipment used: Rolling walker (2 wheels) Transfers: Sit to/from Stand Sit to Stand: Min assist, Contact guard assist           General transfer comment: sit<>stand from bed at lowest position with min A, from recliner, she requires CGA     Balance Overall balance assessment: Needs assistance Sitting-balance support: Feet supported Sitting balance-Leahy Scale: Good     Standing balance support: During functional activity Standing balance-Leahy Scale: Fair                             ADL either performed or assessed with clinical judgement   ADL Overall ADL's : Needs assistance/impaired                     Lower Body Dressing: Maximal assistance;Sit to/from stand Lower Body Dressing Details (indicate cue type and reason): unable to don socks Toilet Transfer: Contact guard assist;Stand-pivot;BSC/3in1   Toileting- Clothing Manipulation and Hygiene: Maximal assistance         General ADL Comments: unable to reach B distal LE, max A for hygiene s/p bowel incontinence    Extremity/Trunk Assessment Upper Extremity Assessment Upper Extremity Assessment: Overall WFL for tasks assessed            Vision       Perception     Praxis  Communication Communication Communication: No apparent difficulties   Cognition Arousal: Alert Behavior During Therapy: WFL for tasks assessed/performed Cognition: No apparent impairments     Awareness: Intellectual awareness impaired Memory impairment (select all impairments): Working [employer] fast streamer Attention impairment (select first level of impairment): Selective attention                     Following commands: Intact        Cueing   Cueing Techniques: Verbal cues  Exercises       Shoulder Instructions       General Comments      Pertinent Vitals/ Pain       Pain Assessment Pain Assessment: No/denies pain  Home Living                                          Prior Functioning/Environment              Frequency  Min 2X/week        Progress Toward Goals  OT Goals(current goals can now be found in the care plan section)  Progress towards OT goals: Progressing toward goals  Acute Rehab OT Goals Patient Stated Goal: to go home OT Goal Formulation: With patient Time For Goal Achievement: 05/01/24 Potential to Achieve Goals: Fair ADL Goals Pt Will Perform Grooming: with set-up;standing Pt Will Perform Lower Body Bathing: with modified independence;sit to/from stand;sitting/lateral leans Pt Will Perform Lower Body Dressing: with set-up;with adaptive equipment;sit to/from stand Pt Will Transfer to Toilet: with modified independence;bedside commode Pt Will Perform Toileting - Clothing Manipulation and hygiene: with modified independence;sit to/from stand;sitting/lateral leans  Plan      Co-evaluation                 AM-PAC OT 6 Clicks Daily Activity     Outcome Measure   Help from another person eating meals?: None Help from another person taking care of personal grooming?: A Little Help from another person toileting, which includes using toliet, bedpan, or urinal?: A Lot Help from another person bathing (including washing, rinsing, drying)?: A Lot Help from another person to put on and taking off regular upper body clothing?: A Little Help from another person to put on and taking off regular lower body clothing?: A Lot 6 Click Score: 16    End of Session Equipment Utilized During Treatment: Rolling walker (2 wheels)  OT Visit Diagnosis: Unsteadiness on feet (R26.81);Muscle weakness (generalized) (M62.81)   Activity Tolerance Patient tolerated treatment well   Patient Left in chair;with call bell/phone  within reach;with chair alarm set   Nurse Communication          Time: 8859-8791 OT Time Calculation (min): 28 min  Charges: OT General Charges $OT Visit: 1 Visit OT Treatments $Self Care/Home Management : 23-37 mins  Rogers Clause, OT/L MSOT, 04/20/2024

## 2024-04-28 ENCOUNTER — Ambulatory Visit

## 2024-05-03 ENCOUNTER — Other Ambulatory Visit: Payer: Self-pay | Admitting: *Deleted

## 2024-05-03 ENCOUNTER — Ambulatory Visit: Admitting: Urology

## 2024-05-14 ENCOUNTER — Other Ambulatory Visit: Payer: Self-pay

## 2024-05-14 ENCOUNTER — Emergency Department

## 2024-05-14 ENCOUNTER — Inpatient Hospital Stay
Admission: EM | Admit: 2024-05-14 | Discharge: 2024-05-20 | DRG: 698 | Disposition: A | Discharge status: Home Health | Attending: Internal Medicine | Admitting: Internal Medicine

## 2024-05-14 DIAGNOSIS — N390 Urinary tract infection, site not specified: Secondary | ICD-10-CM | POA: Diagnosis present

## 2024-05-14 DIAGNOSIS — E119 Type 2 diabetes mellitus without complications: Secondary | ICD-10-CM | POA: Diagnosis present

## 2024-05-14 DIAGNOSIS — Z86711 Personal history of pulmonary embolism: Secondary | ICD-10-CM

## 2024-05-14 DIAGNOSIS — A4151 Sepsis due to Escherichia coli [E. coli]: Secondary | ICD-10-CM | POA: Diagnosis present

## 2024-05-14 DIAGNOSIS — Z8701 Personal history of pneumonia (recurrent): Secondary | ICD-10-CM

## 2024-05-14 DIAGNOSIS — Z5941 Food insecurity: Secondary | ICD-10-CM

## 2024-05-14 DIAGNOSIS — Y846 Urinary catheterization as the cause of abnormal reaction of the patient, or of later complication, without mention of misadventure at the time of the procedure: Secondary | ICD-10-CM | POA: Diagnosis present

## 2024-05-14 DIAGNOSIS — Z7901 Long term (current) use of anticoagulants: Secondary | ICD-10-CM

## 2024-05-14 DIAGNOSIS — Z87440 Personal history of urinary (tract) infections: Secondary | ICD-10-CM

## 2024-05-14 DIAGNOSIS — Z8619 Personal history of other infectious and parasitic diseases: Secondary | ICD-10-CM

## 2024-05-14 DIAGNOSIS — N1330 Unspecified hydronephrosis: Secondary | ICD-10-CM | POA: Diagnosis present

## 2024-05-14 DIAGNOSIS — S51812A Laceration without foreign body of left forearm, initial encounter: Secondary | ICD-10-CM | POA: Diagnosis not present

## 2024-05-14 DIAGNOSIS — C541 Malignant neoplasm of endometrium: Secondary | ICD-10-CM | POA: Diagnosis present

## 2024-05-14 DIAGNOSIS — N136 Pyonephrosis: Secondary | ICD-10-CM | POA: Diagnosis present

## 2024-05-14 DIAGNOSIS — Z8249 Family history of ischemic heart disease and other diseases of the circulatory system: Secondary | ICD-10-CM

## 2024-05-14 DIAGNOSIS — Z1612 Extended spectrum beta lactamase (ESBL) resistance: Secondary | ICD-10-CM | POA: Diagnosis present

## 2024-05-14 DIAGNOSIS — Z95828 Presence of other vascular implants and grafts: Secondary | ICD-10-CM

## 2024-05-14 DIAGNOSIS — T83092A Other mechanical complication of nephrostomy catheter, initial encounter: Secondary | ICD-10-CM | POA: Diagnosis present

## 2024-05-14 DIAGNOSIS — I5032 Chronic diastolic (congestive) heart failure: Secondary | ICD-10-CM | POA: Diagnosis present

## 2024-05-14 DIAGNOSIS — Z604 Social exclusion and rejection: Secondary | ICD-10-CM | POA: Diagnosis present

## 2024-05-14 DIAGNOSIS — Z86718 Personal history of other venous thrombosis and embolism: Secondary | ICD-10-CM

## 2024-05-14 DIAGNOSIS — R195 Other fecal abnormalities: Secondary | ICD-10-CM | POA: Diagnosis not present

## 2024-05-14 DIAGNOSIS — E66813 Obesity, class 3: Secondary | ICD-10-CM | POA: Diagnosis present

## 2024-05-14 DIAGNOSIS — Z6841 Body Mass Index (BMI) 40.0 and over, adult: Secondary | ICD-10-CM

## 2024-05-14 DIAGNOSIS — Z8719 Personal history of other diseases of the digestive system: Secondary | ICD-10-CM

## 2024-05-14 DIAGNOSIS — Z7951 Long term (current) use of inhaled steroids: Secondary | ICD-10-CM

## 2024-05-14 DIAGNOSIS — D649 Anemia, unspecified: Secondary | ICD-10-CM | POA: Diagnosis present

## 2024-05-14 DIAGNOSIS — Z1152 Encounter for screening for COVID-19: Secondary | ICD-10-CM

## 2024-05-14 DIAGNOSIS — I110 Hypertensive heart disease with heart failure: Secondary | ICD-10-CM | POA: Diagnosis present

## 2024-05-14 DIAGNOSIS — Z923 Personal history of irradiation: Secondary | ICD-10-CM

## 2024-05-14 DIAGNOSIS — B957 Other staphylococcus as the cause of diseases classified elsewhere: Secondary | ICD-10-CM | POA: Diagnosis present

## 2024-05-14 DIAGNOSIS — I890 Lymphedema, not elsewhere classified: Secondary | ICD-10-CM | POA: Diagnosis present

## 2024-05-14 DIAGNOSIS — R509 Fever, unspecified: Secondary | ICD-10-CM | POA: Diagnosis not present

## 2024-05-14 DIAGNOSIS — I10 Essential (primary) hypertension: Secondary | ICD-10-CM | POA: Diagnosis present

## 2024-05-14 DIAGNOSIS — N12 Tubulo-interstitial nephritis, not specified as acute or chronic: Principal | ICD-10-CM

## 2024-05-14 DIAGNOSIS — T83512A Infection and inflammatory reaction due to nephrostomy catheter, initial encounter: Principal | ICD-10-CM | POA: Diagnosis present

## 2024-05-14 DIAGNOSIS — J449 Chronic obstructive pulmonary disease, unspecified: Secondary | ICD-10-CM | POA: Diagnosis present

## 2024-05-14 DIAGNOSIS — X58XXXA Exposure to other specified factors, initial encounter: Secondary | ICD-10-CM | POA: Diagnosis not present

## 2024-05-14 DIAGNOSIS — K31819 Angiodysplasia of stomach and duodenum without bleeding: Secondary | ICD-10-CM | POA: Diagnosis present

## 2024-05-14 DIAGNOSIS — K219 Gastro-esophageal reflux disease without esophagitis: Secondary | ICD-10-CM | POA: Diagnosis present

## 2024-05-14 DIAGNOSIS — Z833 Family history of diabetes mellitus: Secondary | ICD-10-CM

## 2024-05-14 DIAGNOSIS — E7800 Pure hypercholesterolemia, unspecified: Secondary | ICD-10-CM | POA: Diagnosis present

## 2024-05-14 DIAGNOSIS — Z79899 Other long term (current) drug therapy: Secondary | ICD-10-CM

## 2024-05-14 DIAGNOSIS — N99528 Other complication of other external stoma of urinary tract: Secondary | ICD-10-CM | POA: Diagnosis present

## 2024-05-14 DIAGNOSIS — Z5948 Other specified lack of adequate food: Secondary | ICD-10-CM

## 2024-05-14 DIAGNOSIS — Y732 Prosthetic and other implants, materials and accessory gastroenterology and urology devices associated with adverse incidents: Secondary | ICD-10-CM | POA: Diagnosis present

## 2024-05-14 DIAGNOSIS — Z83438 Family history of other disorder of lipoprotein metabolism and other lipidemia: Secondary | ICD-10-CM

## 2024-05-14 DIAGNOSIS — N10 Acute tubulo-interstitial nephritis: Secondary | ICD-10-CM | POA: Diagnosis present

## 2024-05-14 LAB — COMPREHENSIVE METABOLIC PANEL WITH GFR
ALT: 11 U/L (ref 0–44)
AST: 25 U/L (ref 15–41)
Albumin: 3.1 g/dL — ABNORMAL LOW (ref 3.5–5.0)
Alkaline Phosphatase: 55 U/L (ref 38–126)
Anion gap: 9 (ref 5–15)
BUN: 19 mg/dL (ref 8–23)
CO2: 21 mmol/L — ABNORMAL LOW (ref 22–32)
Calcium: 8.9 mg/dL (ref 8.9–10.3)
Chloride: 106 mmol/L (ref 98–111)
Creatinine, Ser: 0.91 mg/dL (ref 0.44–1.00)
GFR, Estimated: >60 mL/min (ref >60)
Glucose, Bld: 160 mg/dL — ABNORMAL HIGH (ref 70–99)
Potassium: 4.1 mmol/L (ref 3.5–5.1)
Sodium: 136 mmol/L (ref 135–145)
Total Bilirubin: 1 mg/dL (ref 0.0–1.2)
Total Protein: 7.3 g/dL (ref 6.5–8.1)

## 2024-05-14 LAB — CBC WITH DIFFERENTIAL/PLATELET
Abs Immature Granulocytes: 0.04 K/uL (ref 0.00–0.07)
Basophils Absolute: 0 K/uL (ref 0.0–0.1)
Basophils Relative: 0 %
Eosinophils Absolute: 0 K/uL (ref 0.0–0.5)
Eosinophils Relative: 0 %
HCT: 35.7 % — ABNORMAL LOW (ref 36.0–46.0)
Hemoglobin: 11 g/dL — ABNORMAL LOW (ref 12.0–15.0)
Immature Granulocytes: 0 %
Lymphocytes Relative: 7 %
Lymphs Abs: 0.7 K/uL (ref 0.7–4.0)
MCH: 26.2 pg (ref 26.0–34.0)
MCHC: 30.8 g/dL (ref 30.0–36.0)
MCV: 85 fL (ref 80.0–100.0)
Monocytes Absolute: 0.3 K/uL (ref 0.1–1.0)
Monocytes Relative: 3 %
Neutro Abs: 8.9 K/uL — ABNORMAL HIGH (ref 1.7–7.7)
Neutrophils Relative %: 90 %
Platelets: 261 K/uL (ref 150–400)
RBC: 4.2 MIL/uL (ref 3.87–5.11)
RDW: 18.7 % — ABNORMAL HIGH (ref 11.5–15.5)
WBC: 10 K/uL (ref 4.0–10.5)
nRBC: 0 % (ref 0.0–0.2)

## 2024-05-14 LAB — RESP PANEL BY RT-PCR (RSV, FLU A&B, COVID)  RVPGX2
Influenza A by PCR: NEGATIVE
Influenza B by PCR: NEGATIVE
Resp Syncytial Virus by PCR: NEGATIVE
SARS Coronavirus 2 by RT PCR: NEGATIVE

## 2024-05-14 LAB — TROPONIN I (HIGH SENSITIVITY): Troponin I (High Sensitivity): 8 ng/L (ref <18)

## 2024-05-14 LAB — LACTIC ACID, PLASMA: Lactic Acid, Venous: 1 mmol/L (ref 0.5–1.9)

## 2024-05-14 MED ORDER — LACTATED RINGERS IV BOLUS (SEPSIS)
1000.0000 mL | Freq: Once | INTRAVENOUS | Status: AC
  Administered 2024-05-14: 1000 mL via INTRAVENOUS

## 2024-05-14 MED ORDER — SODIUM CHLORIDE 0.9 % IV SOLN
1.0000 g | Freq: Three times a day (TID) | INTRAVENOUS | Status: DC
  Administered 2024-05-14 – 2024-05-20 (×17): 1 g via INTRAVENOUS
  Filled 2024-05-14 (×19): qty 20

## 2024-05-14 MED ORDER — IOHEXOL 300 MG/ML  SOLN
100.0000 mL | Freq: Once | INTRAMUSCULAR | Status: AC | PRN
  Administered 2024-05-14: 100 mL via INTRAVENOUS

## 2024-05-14 NOTE — Consult Note (Signed)
 Chart review  Patient with complicated history. Can't be placed in lithotomy so ultimately her left hydronephrosis has been managed initially with a nephrostomy and now a nephroureteral stent. This was last changed by IR 8/29.  She presented with fever and likely UTI. CT with NUS in place but with some hydronephrosis--? Catheter kinking/malfunction  I would recommend uncapping the nephrostomy tube and placing to external drainage bag to maximize drainage. OK to flush nephrostomy with 10cc flush if drainage is poor externally. If still poor drainage contact IR for sooner exchange.  Otherwise contact IR in the morning to arrange nephroureteral stent exchange. Continue antibiotics.

## 2024-05-14 NOTE — ED Provider Notes (Signed)
 [facility] Provider Note    Event Date/Time   First MD Initiated Contact with Patient 05/14/24 1908     (approximate)   History   Code Sepsis   HPI  Courtney Mack is a 68 y.o. female with a history of nephrostomy tube, UTI, DVT, type 2 diabetes, endometrial cancer, hypertension, CHF, PE, and COPD who presents with fever and left lower quadrant abdominal pain along with generalized weakness over the last day.  She denies any vomiting or diarrhea.  She has no acute urinary symptoms.  She states she has a mild cough but no shortness of breath.  She also reports a headache.  I reviewed the past medical records.  The patient was admitted to the hospitalist service earlier this month with exertional dyspnea which resolved.  She has been treated for complex UTI.  She had been on antibiotics earlier last month that were stopped on 10/10 but is on trimethoprim  for prophylaxis.   Physical Exam   Triage Vital Signs: ED Triage Vitals  Encounter Vitals Group     BP 05/14/24 1900 (!) 131/45     Girls Systolic BP Percentile --      Girls Diastolic BP Percentile --      Boys Systolic BP Percentile --      Boys Diastolic BP Percentile --      Pulse Rate 05/14/24 1900 100     Resp 05/14/24 1900 (!) 22     Temp 05/14/24 1900 100.3 F (37.9 C)     Temp Source 05/14/24 1900 Oral     SpO2 05/14/24 1900 94 %     Weight --      Height 05/14/24 1901 4' 11 (1.499 m)     Head Circumference --      Peak Flow --      Pain Score 05/14/24 1901 0     Pain Loc --      Pain Education --      Exclude from Growth Chart --     Most recent vital signs: Vitals:   05/14/24 1900  BP: (!) 131/45  Pulse: 100  Resp: (!) 22  Temp: 100.3 F (37.9 C)  SpO2: 94%     General: Awake, no distress.  CV:  Good peripheral perfusion.  Resp:  Normal effort.  Lungs CTAB. Abd:  Soft with mild left lower quadrant tenderness.  No distention.  Other:  No peripheral edema.  Somewhat  dry mucous membranes.   ED Results / Procedures / Treatments   Labs (all labs ordered are listed, but only abnormal results are displayed) Labs Reviewed  COMPREHENSIVE METABOLIC PANEL WITH GFR - Abnormal; Notable for the following components:      Result Value   CO2 21 (*)    Glucose, Bld 160 (*)    Albumin 3.1 (*)    All other components within normal limits  CBC WITH DIFFERENTIAL/PLATELET - Abnormal; Notable for the following components:   Hemoglobin 11.0 (*)    HCT 35.7 (*)    RDW 18.7 (*)    Neutro Abs 8.9 (*)    All other components within normal limits  RESP PANEL BY RT-PCR (RSV, FLU A&B, COVID)  RVPGX2  CULTURE, BLOOD (ROUTINE X 2)  CULTURE, BLOOD (ROUTINE X 2)  LACTIC ACID, PLASMA  URINALYSIS, W/ REFLEX TO CULTURE (INFECTION SUSPECTED)  TROPONIN I (HIGH SENSITIVITY)     EKG  ED ECG REPORT I, Waylon Cassis, the attending physician, personally viewed and interpreted  this ECG.  Date: 05/14/2024 EKG Time: 1907 Rate: 89 Rhythm: normal sinus rhythm QRS Axis: normal Intervals: normal ST/T Wave abnormalities: normal Narrative Interpretation: no evidence of acute ischemia    RADIOLOGY  Chest x-ray: I independently viewed and interpreted the images; there is no focal consolidation or edema  CT abdomen/pelvis:   IMPRESSION:  1. Left-sided hydronephrosis with considerable perinephric stranding, new from  the prior exam, with nephroureteral catheter in satisfactory position; the  decompressed urinary bladder raises suspicion for catheter malfunction or  kinking.  2. Right renal calculus measuring approximately 6 mm without obstructive  changes.    PROCEDURES:  Critical Care performed: No  Procedures   MEDICATIONS ORDERED IN ED: Medications  meropenem  (MERREM ) 1 g in sodium chloride  0.9 % 100 mL IVPB (1 g Intravenous New Bag/Given 05/14/24 2159)  lactated ringers  bolus 1,000 mL (1,000 mLs Intravenous New Bag/Given 05/14/24 1925)  iohexol  (OMNIPAQUE )  300 MG/ML solution 100 mL (100 mLs Intravenous Contrast Given 05/14/24 2051)     IMPRESSION / MDM / ASSESSMENT AND PLAN / ED COURSE  I reviewed the triage vital signs and the nursing notes.  68 year old female with PMH as noted above presents with fever, lower abdominal pain, generalized weakness.  On exam her temperature is borderline elevated.  Other vital signs are normal.  Physical exam is unremarkable for acute findings except for mild left lower quadrant tenderness with no peritoneal signs.  Differential diagnosis includes, but is not limited to, recurrent UTI, pyelonephritis, colitis, diverticulitis, other intra-abdominal infection, pneumonia, viral syndrome.  There is no clinical evidence for meningitis or encephalitis.  We will obtain sepsis lab workup, chest x-ray, CT abdomen/pelvis, give fluids, and reassess.  Patient's presentation is most consistent with acute presentation with potential threat to life or bodily function.  The patient is on the cardiac monitor to evaluate for evidence of arrhythmia and/or significant heart rate changes.   ----------------------------------------- 10:22 PM on 05/14/2024 -----------------------------------------  Lab workup is reassuring.  CBC shows no leukocytosis.  Lactate is normal.  Creatinine is normal.  CT shows hydronephrosis and a decompressed bladder.  There is some perinephric stranding.  There is concern for possible pyelonephritis or obstruction of the patient's urinary stent.  I consulted Dr. Carolee from urology who recommended taking the Off of the nephrostomy and putting on a bag to try to let it drain.  We can also try to flush it.  He recommended IR consultation in the morning for possible replacement of the nephrostomy.  I have ordered meropenem  for antibiotic coverage based on review of the patient's prior cultures including recent prior ESBL infection last month.  At this time, the patient is hemodynamically stable with no evidence  of sepsis.  I consulted Dr. Cleatus from the hospitalist service; based on our discussion she agrees to evaluate the patient for admission.  FINAL CLINICAL IMPRESSION(S) / ED DIAGNOSES   Final diagnoses:  Pyelonephritis     Rx / DC Orders   ED Discharge Orders     None        Note:  This document was prepared using Dragon voice recognition software and may include unintentional dictation errors.    Jacolyn Pae, MD 05/14/24 2224

## 2024-05-14 NOTE — ED Triage Notes (Signed)
 BIB [facility] from home for possible sepsis. fever 101.2 CHF/COPD. Left side nephrostomy tube placed 3 months ago.  Was here 1 month ago ofr same and is still taking antibiotics.  took [facility] PTA of EMS. Pt states burning with urination and foul smell.

## 2024-05-15 DIAGNOSIS — N10 Acute tubulo-interstitial nephritis: Secondary | ICD-10-CM

## 2024-05-15 DIAGNOSIS — Z8619 Personal history of other infectious and parasitic diseases: Secondary | ICD-10-CM

## 2024-05-15 LAB — BLOOD CULTURE ID PANEL (REFLEXED) - BCID2

## 2024-05-15 LAB — BASIC METABOLIC PANEL WITH GFR
Anion gap: 10 (ref 5–15)
BUN: 19 mg/dL (ref 8–23)
CO2: 22 mmol/L (ref 22–32)
Calcium: 8.3 mg/dL — ABNORMAL LOW (ref 8.9–10.3)
Chloride: 105 mmol/L (ref 98–111)
Creatinine, Ser: 0.97 mg/dL (ref 0.44–1.00)
GFR, Estimated: >60 mL/min (ref >60)
Glucose, Bld: 129 mg/dL — ABNORMAL HIGH (ref 70–99)
Potassium: 4 mmol/L (ref 3.5–5.1)
Sodium: 137 mmol/L (ref 135–145)

## 2024-05-15 LAB — CBC
HCT: 31.2 % — ABNORMAL LOW (ref 36.0–46.0)
Hemoglobin: 9.5 g/dL — ABNORMAL LOW (ref 12.0–15.0)
MCH: 25.6 pg — ABNORMAL LOW (ref 26.0–34.0)
MCHC: 30.4 g/dL (ref 30.0–36.0)
MCV: 84.1 fL (ref 80.0–100.0)
Platelets: 254 K/uL (ref 150–400)
RBC: 3.71 MIL/uL — ABNORMAL LOW (ref 3.87–5.11)
RDW: 18.6 % — ABNORMAL HIGH (ref 11.5–15.5)
WBC: 11.3 K/uL — ABNORMAL HIGH (ref 4.0–10.5)
nRBC: 0 % (ref 0.0–0.2)

## 2024-05-15 MED ORDER — SODIUM CHLORIDE 0.9 % IV BOLUS
1000.0000 mL | Freq: Once | INTRAVENOUS | Status: AC
  Administered 2024-05-15: 1000 mL via INTRAVENOUS

## 2024-05-15 MED ORDER — LACTATED RINGERS IV BOLUS
1000.0000 mL | Freq: Once | INTRAVENOUS | Status: AC
  Administered 2024-05-15: 1000 mL via INTRAVENOUS

## 2024-05-15 MED ORDER — VANCOMYCIN HCL IN DEXTROSE 1-5 GM/200ML-% IV SOLN: 1000.0000 mg | INTRAVENOUS | Status: DC

## 2024-05-15 MED ORDER — ALBUTEROL SULFATE (2.5 MG/3ML) 0.083% IN NEBU: 2.5000 mg | INHALATION_SOLUTION | RESPIRATORY_TRACT | Status: DC | PRN

## 2024-05-15 MED ORDER — SIMETHICONE 80 MG PO CHEW
80.0000 mg | CHEWABLE_TABLET | Freq: Four times a day (QID) | ORAL | Status: DC | PRN
Start: 2024-05-15 — End: 2024-05-20

## 2024-05-15 MED ORDER — PANTOPRAZOLE SODIUM 40 MG PO TBEC
40.0000 mg | DELAYED_RELEASE_TABLET | Freq: Two times a day (BID) | ORAL | Status: DC
  Administered 2024-05-15 – 2024-05-20 (×11): 40 mg via ORAL
  Filled 2024-05-15 (×11): qty 1

## 2024-05-15 MED ORDER — ONDANSETRON HCL 4 MG PO TABS: 4.0000 mg | ORAL_TABLET | Freq: Four times a day (QID) | ORAL | Status: DC | PRN

## 2024-05-15 MED ORDER — ENOXAPARIN SODIUM 30 MG/0.3ML IJ SOSY: 30.0000 mg | PREFILLED_SYRINGE | Freq: Two times a day (BID) | INTRAMUSCULAR | Status: DC

## 2024-05-15 MED ORDER — ACETAMINOPHEN 325 MG PO TABS
650.0000 mg | ORAL_TABLET | Freq: Four times a day (QID) | ORAL | Status: DC | PRN
  Administered 2024-05-15 (×2): 650 mg via ORAL
  Filled 2024-05-15 (×2): qty 2

## 2024-05-15 MED ORDER — ACETAMINOPHEN 650 MG RE SUPP: 650.0000 mg | Freq: Four times a day (QID) | RECTAL | Status: DC | PRN

## 2024-05-15 MED ORDER — MIDODRINE HCL 5 MG PO TABS
5.0000 mg | ORAL_TABLET | Freq: Three times a day (TID) | ORAL | Status: DC
  Administered 2024-05-15 – 2024-05-16 (×5): 5 mg via ORAL
  Filled 2024-05-15 (×5): qty 1

## 2024-05-15 MED ORDER — ONDANSETRON HCL 4 MG/2ML IJ SOLN: 4.0000 mg | Freq: Four times a day (QID) | INTRAMUSCULAR | Status: DC | PRN

## 2024-05-15 MED ORDER — ENOXAPARIN SODIUM 30 MG/0.3ML IJ SOSY
30.0000 mg | PREFILLED_SYRINGE | Freq: Two times a day (BID) | INTRAMUSCULAR | Status: DC
  Administered 2024-05-15 – 2024-05-20 (×10): 30 mg via SUBCUTANEOUS
  Filled 2024-05-15 (×10): qty 0.3

## 2024-05-15 MED ORDER — LOPERAMIDE HCL 2 MG PO CAPS: 2.0000 mg | ORAL_CAPSULE | ORAL | Status: DC | PRN

## 2024-05-15 MED ORDER — HYDROCODONE-ACETAMINOPHEN 5-325 MG PO TABS
1.0000 | ORAL_TABLET | ORAL | Status: DC | PRN
  Administered 2024-05-18 – 2024-05-20 (×5): 1 via ORAL
  Filled 2024-05-15 (×5): qty 1

## 2024-05-15 MED ORDER — VANCOMYCIN HCL 2000 MG/400ML IV SOLN
2000.0000 mg | INTRAVENOUS | Status: AC
  Administered 2024-05-15: 2000 mg via INTRAVENOUS
  Filled 2024-05-15: qty 400

## 2024-05-15 MED ORDER — ROSUVASTATIN CALCIUM 10 MG PO TABS
10.0000 mg | ORAL_TABLET | Freq: Every day | ORAL | Status: DC
  Administered 2024-05-15 – 2024-05-20 (×6): 10 mg via ORAL
  Filled 2024-05-15 (×6): qty 1

## 2024-05-15 MED ORDER — LACTATED RINGERS IV SOLN
150.0000 mL/h | INTRAVENOUS | Status: DC
  Administered 2024-05-15: 150 mL/h via INTRAVENOUS

## 2024-05-15 MED ORDER — KETOROLAC TROMETHAMINE 15 MG/ML IJ SOLN
15.0000 mg | Freq: Four times a day (QID) | INTRAMUSCULAR | Status: DC | PRN
Start: 2024-05-15 — End: 2024-05-20
  Administered 2024-05-17: 15 mg via INTRAVENOUS
  Filled 2024-05-15: qty 1

## 2024-05-15 MED ORDER — LACTATED RINGERS IV SOLN: INTRAVENOUS | Status: AC

## 2024-05-15 NOTE — Consult Note (Signed)
 Pharmacy Antibiotic Note  Courtney Mack is a 68 y.o. female admitted on 05/14/2024 with bacteremia and UTI.  Pharmacy has been consulted for Vancomycin  dosing.   Plan: Vancomycin  2000 mg IV now, follow by Vancomycin  1000mg  Q 48 hrs.  Goal AUC 400-550. Expected AUC: 530 Expected Cmin: 13.2 SCr used: 0.97  Height: 4' 11 (149.9 cm) Weight: 91.8 kg (202 lb 6.1 oz) IBW/kg (Calculated) : 43.2  Temp (24hrs), Avg:100 F (37.8 C), Min:98.7 F (37.1 C), Max:102.7 F (39.3 C)  Recent Labs  Lab 05/14/24 1924 05/15/24 0444  WBC 10.0 11.3*  CREATININE 0.91 0.97  LATICACIDVEN 1.0  --     Estimated Creatinine Clearance: 54.1 mL/min (by C-G formula based on SCr of 0.97 mg/dL).    No Known Allergies  Antimicrobials this admission: meropenem  11/7 >>  Vancomycin  11/8 >>   Dose adjustments this admission: n/a  Microbiology results: 11/7 BCx: 1 of 4 bottles, Staph epi (resistance), provider will repeat cultures as well.  Thank you for allowing pharmacy to be a part of this patient's care.  Coen Miyasato Rodriguez-Guzman PharmD, BCPS 05/15/2024 5:15 PM

## 2024-05-15 NOTE — Assessment & Plan Note (Addendum)
 Suspect sepsis History of ESBL E. coli UTI 04/06/2024 Possible obstruction of left nephrostomy/nephroureteral catheter Patient with frequent UTI including ESBL E. coli presenting with fever and tachycardia and left flank pain, pyelonephritis on CT Continue meropenem  given recent ESBL IV fluids per sepsis protocol Follow cultures Urology consulted due to possible obstruction (hydronephrosis with decompressed bladder in spite of nephroureteral catheter) IR consulted for nephrostomy exchange as recommended by urology on ER consult

## 2024-05-15 NOTE — Assessment & Plan Note (Signed)
 Clinically euvolemic Monitor for fluid overload given IV fluids in the management of sepsis

## 2024-05-15 NOTE — Assessment & Plan Note (Signed)
 No acute issues suspected

## 2024-05-15 NOTE — Progress Notes (Signed)
 Pharmacy Antibiotic Note  Courtney Mack is a 68 y.o. female admitted on 05/14/2024 with ESBL infection.  Pharmacy has been consulted for meropenem  dosing.  Plan: Meropenem  1 gm IV Q8H started on 11/7 @ 2200   Height: 4' 11 (149.9 cm) Weight: 91.8 kg (202 lb 6.1 oz) IBW/kg (Calculated) : 43.2  Temp (24hrs), Avg:100.3 F (37.9 C), Min:100.3 F (37.9 C), Max:100.3 F (37.9 C)  Recent Labs  Lab 05/14/24 1924  WBC 10.0  CREATININE 0.91  LATICACIDVEN 1.0    Estimated Creatinine Clearance: 57.7 mL/min (by C-G formula based on SCr of 0.91 mg/dL).    No Known Allergies  Antimicrobials this admission:   >>    >>   Dose adjustments this admission:   Microbiology results:  BCx:   UCx:    Sputum:    MRSA PCR:   Thank you for allowing pharmacy to be a part of this patient's care.  Locklan Canoy D 05/15/2024 12:34 AM

## 2024-05-15 NOTE — Assessment & Plan Note (Signed)
 Complicating factor to overall prognosis and care

## 2024-05-15 NOTE — Progress Notes (Signed)
 Dr Cleatus notified of Patient yellow mews, HR 72, BP 98/37 MAP 56, temp 98.8, and resp 32.  rechecked resp and had come down to 26.  Denies pain

## 2024-05-15 NOTE — Assessment & Plan Note (Signed)
 Not acutely exacerbated DuoNebs as needed

## 2024-05-15 NOTE — Consult Note (Addendum)
 H&P Physician requesting consult: Courtney Mack  Chief Complaint: Left hydronephrosis, UTI and fever  History of Present Illness: Patient with complicated history. Can't be placed in lithotomy so ultimately her left hydronephrosis has been managed initially with a nephrostomy and now a nephroureteral stent. This was last changed by IR 8/29.   She presented with fever and likely UTI. CT with NUS in place but with some hydronephrosis--? Catheter kinking/malfunction  Last night at 9:53 PM, provided clear instructions to uncap her nephrostomy tube and place it to drainage to relieve the obstruction and hydronephrosis.  This was acknowledged in secure chat and also entered recommendations into the chart.  Unfortunately, the instructions were not followed and when I evaluated her this afternoon, the nephrostomy tube was still capped.  I uncapped it and urine immediately poured out.  The tube flushed easily.  I asked the charge nurse to find a nephrostomy bag so that the tube could be kept to drainage.  She was able to find one and help us  out.  Fortunately, patient is comfortable and vitals are improving and she is afebrile.  She is currently stable.  She is mentating well and states she is feeling better.  Past Medical History:  Diagnosis Date   Acute bilateral deep vein thrombosis (DVT) of femoral veins (HCC) 01/02/2021   Acute massive pulmonary embolism (HCC) 08/25/2020   Last Assessment & Plan:  Formatting of this note might be different from the original. Post vena caval filter and on xarelto  and O2   Anemia    ARF (acute respiratory failure) (HCC)    Cellulitis of left lower leg 06/20/2021   CHF (congestive heart failure) (HCC)    COVID-19    Diabetes mellitus without complication (HCC)    DVT (deep venous thrombosis) (HCC) 09/06/2020   Endometrial cancer (HCC)    GAVE (gastric antral vascular ectasia)    GERD (gastroesophageal reflux disease)    High cholesterol    Hx of blood clots     Hyperlipidemia    Hypertension    IDA (iron  deficiency anemia) 09/21/2020   Obesity    Pneumonia 06/11/2022   recovered   Pressure injury of skin 06/21/2021   Pulmonary embolism (HCC)    Sepsis (HCC)    Thrombocytopenia    Urinary tract infection 09/13/2021   Past Surgical History:  Procedure Laterality Date   CATARACT EXTRACTION W/PHACO Right 06/04/2022   Procedure: CATARACT EXTRACTION PHACO AND INTRAOCULAR LENS PLACEMENT (IOC) COMPLICATED RIGHT  31.83  02:17.4;  Surgeon: Jaye Fallow, MD;  Location: [facility];  Service: Ophthalmology;  Laterality: Right;   CATARACT EXTRACTION W/PHACO Left 07/09/2022   Procedure: CATARACT EXTRACTION PHACO AND INTRAOCULAR LENS PLACEMENT (IOC);  Surgeon: Jaye Fallow, MD;  Location: [facility];  Service: Ophthalmology;  Laterality: Left;  22.75 1:40.3   COLONOSCOPY N/A 06/21/2021   Procedure: COLONOSCOPY;  Surgeon: Toledo, Ladell POUR, MD;  Location: [facility];  Service: Gastroenterology;  Laterality: N/A;   CYSTOSCOPY W/ URETERAL STENT PLACEMENT Left 09/11/2021   Procedure: CYSTOSCOPY WITH RETROGRADE PYELOGRAM/URETERAL STENT PLACEMENT;  Surgeon: Twylla Glendia BROCKS, MD;  Location: [facility];  Service: Urology;  Laterality: Left;   CYSTOSCOPY W/ URETERAL STENT PLACEMENT Left 11/17/2021   Procedure: CYSTOSCOPY WITH STENT REPLACEMENT;  Surgeon: Watt Rush, MD;  Location: [facility];  Service: Urology;  Laterality: Left;   CYSTOSCOPY WITH FULGERATION  11/17/2021   Procedure: CYSTOSCOPY WITH FULGERATION;  Surgeon: Watt Rush, MD;  Location: [facility];  Service: Urology;;   CYSTOSCOPY WITH  STENT PLACEMENT Left 10/17/2021   Procedure: CYSTOSCOPY WITH STENT PLACEMENT;  Surgeon: Twylla Glendia BROCKS, MD;  Location: [facility];  Service: Urology;  Laterality: Left;   CYSTOSCOPY WITH URETEROSCOPY, STONE BASKETRY AND STENT PLACEMENT Left 10/16/2021   Procedure: CYSTOSCOPY WITH URETEROSCOPY  AND STENT REMOVAL;  Surgeon: Twylla Glendia BROCKS, MD;  Location:  [facility];  Service: Urology;  Laterality: Left;   ENTEROSCOPY N/A 08/17/2021   Procedure: ENTEROSCOPY;  Surgeon: Unk Corinn Skiff, MD;  Location: [facility];  Service: Gastroenterology;  Laterality: N/A;   ESOPHAGOGASTRODUODENOSCOPY N/A 06/21/2021   Procedure: ESOPHAGOGASTRODUODENOSCOPY (EGD);  Surgeon: Toledo, Ladell POUR, MD;  Location: [facility];  Service: Gastroenterology;  Laterality: N/A;   ESOPHAGOGASTRODUODENOSCOPY (EGD) WITH PROPOFOL  N/A 08/17/2021   Procedure: ESOPHAGOGASTRODUODENOSCOPY (EGD) WITH PROPOFOL ;  Surgeon: Unk Corinn Skiff, MD;  Location: [facility];  Service: Gastroenterology;  Laterality: N/A;   ESOPHAGOGASTRODUODENOSCOPY (EGD) WITH PROPOFOL  N/A 10/14/2021   Procedure: ESOPHAGOGASTRODUODENOSCOPY (EGD) WITH PROPOFOL ;  Surgeon: Jinny Carmine, MD;  Location: [facility];  Service: Endoscopy;  Laterality: N/A;   ESOPHAGOGASTRODUODENOSCOPY (EGD) WITH PROPOFOL  N/A 02/01/2022   Procedure: ESOPHAGOGASTRODUODENOSCOPY (EGD) WITH PROPOFOL ;  Surgeon: Unk Corinn Skiff, MD;  Location: [facility];  Service: Gastroenterology;  Laterality: N/A;   ESOPHAGOGASTRODUODENOSCOPY (EGD) WITH PROPOFOL  N/A 03/15/2022   Procedure: ESOPHAGOGASTRODUODENOSCOPY (EGD) WITH PROPOFOL ;  Surgeon: Therisa Bi, MD;  Location: [facility];  Service: Gastroenterology;  Laterality: N/A;   ESOPHAGOGASTRODUODENOSCOPY (EGD) WITH PROPOFOL  N/A 04/18/2022   Procedure: ESOPHAGOGASTRODUODENOSCOPY (EGD) WITH PROPOFOL ;  Surgeon: Unk Corinn Skiff, MD;  Location: [facility];  Service: Gastroenterology;  Laterality: N/A;   ESOPHAGOGASTRODUODENOSCOPY (EGD) WITH PROPOFOL  N/A 08/09/2022   Procedure: ESOPHAGOGASTRODUODENOSCOPY (EGD) WITH PROPOFOL ;  Surgeon: Therisa Bi, MD;  Location: [facility] ENDOSCOPY;  Service: Gastroenterology;  Laterality: N/A;   GIVENS CAPSULE STUDY N/A 06/22/2021   Procedure: GIVENS CAPSULE STUDY;  Surgeon: Toledo, Ladell POUR, MD;  Location: [facility];  Service: Gastroenterology;   Laterality: N/A;   IR CONVERT LEFT NEPHROSTOMY TO NEPHROURETERAL CATH  08/29/2023   IR EXT NEPHROURETERAL CATH EXCHANGE  03/05/2024   IR NEPHROSTOMY EXCHANGE LEFT  02/25/2022   IR NEPHROSTOMY EXCHANGE LEFT  03/27/2022   IR NEPHROSTOMY EXCHANGE LEFT  05/22/2022   IR NEPHROSTOMY EXCHANGE LEFT  07/24/2022   IR NEPHROSTOMY EXCHANGE LEFT  09/24/2022   IR NEPHROSTOMY EXCHANGE LEFT  11/21/2022   IR NEPHROSTOMY EXCHANGE LEFT  02/17/2023   IR NEPHROSTOMY EXCHANGE LEFT  04/06/2023   IR NEPHROSTOMY EXCHANGE LEFT  05/27/2023   IR NEPHROSTOMY EXCHANGE LEFT  07/28/2023   IR NEPHROSTOMY PLACEMENT LEFT  12/27/2021   IVC FILTER INSERTION N/A 08/18/2020   Procedure: IVC FILTER INSERTION;  Surgeon: Jama Cordella MATSU, MD;  Location: [facility];  Service: Cardiovascular;  Laterality: N/A;   PERIPHERAL VASCULAR THROMBECTOMY Bilateral 01/03/2021   Procedure: PERIPHERAL VASCULAR THROMBECTOMY;  Surgeon: Marea Selinda RAMAN, MD;  Location: [facility];  Service: Cardiovascular;  Laterality: Bilateral;   PULMONARY THROMBECTOMY N/A 08/18/2020   Procedure: PULMONARY THROMBECTOMY;  Surgeon: Jama Cordella MATSU, MD;  Location: [facility];  Service: Cardiovascular;  Laterality: N/A;   TUBAL LIGATION     VISCERAL ANGIOGRAPHY N/A 07/18/2021   Procedure: VISCERAL ANGIOGRAPHY;  Surgeon: Marea Selinda RAMAN, MD;  Location: [facility];  Service: Cardiovascular;  Laterality: N/A;    Home Medications:  Medications Prior to Admission  Medication Sig Dispense Refill Last Dose/Taking   acetaminophen  (TYLENOL ) 500 MG tablet Take 500 mg by mouth every 6 (six) hours as needed for  mild pain or fever.   05/14/2024 at  6:30 PM   albuterol  (VENTOLIN  HFA) 108 (90 Base) MCG/ACT inhaler Inhale 2 puffs into the lungs every 6 (six) hours as needed for wheezing or shortness of breath. (Patient taking differently: Inhale 2 puffs into the lungs every 4 (four) hours as needed for wheezing or shortness of breath.) 8 g 0 05/14/2024 Evening    Ascorbic Acid  (VITAMIN C ) 1000 MG tablet Take 1,000 mg by mouth daily.   05/13/2024 Morning   cyanocobalamin  100 MCG tablet Take 100 mcg by mouth daily.   05/13/2024 Morning   enoxaparin  (LOVENOX ) 30 MG/0.3ML injection Inject 0.3 mLs (30 mg total) into the skin every 12 (twelve) hours. 18 mL 0 05/13/2024 Morning   fluticasone  furoate-vilanterol (BREO ELLIPTA ) 100-25 MCG/ACT AEPB Inhale 1 puff into the lungs daily.   05/14/2024 Morning   furosemide  (LASIX ) 20 MG tablet Take 20 mg by mouth daily.   05/13/2024 Morning   loperamide  (IMODIUM ) 2 MG capsule Take 2 mg by mouth as needed for diarrhea or loose stools.   05/13/2024 Morning   Multiple Vitamin (MULTIVITAMIN WITH MINERALS) TABS tablet Take 1 tablet by mouth daily.   05/13/2024 Morning   pantoprazole  (PROTONIX ) 40 MG tablet Take 40 mg by mouth 2 (two) times daily.   05/13/2024 Morning   rosuvastatin  (CRESTOR ) 10 MG tablet TAKE 1 TABLET(10 MG) BY MOUTH DAILY 30 tablet 6 Past Month   simethicone  (MYLICON) 80 MG chewable tablet Chew 1 tablet (80 mg total) by mouth every 6 (six) hours as needed for flatulence. 30 tablet 0 05/13/2024 Morning   trimethoprim  (TRIMPEX ) 100 MG tablet Take 1 tablet (100 mg total) by mouth at bedtime. 30 tablet 0 05/13/2024 Bedtime   Allergies: No Known Allergies  Family History  Problem Relation Age of Onset   Hypertension Mother    Diabetes Mother    Diabetes Father    Hypertension Father    High Cholesterol Father    Congestive Heart Failure Father    Breast cancer Cousin    Social History:  reports that she has never smoked. She has never used smokeless tobacco. She reports that she does not currently use alcohol . She reports that she does not use drugs.  ROS: A complete review of systems was performed.  All systems are negative except for pertinent findings as noted. ROS   Physical Exam:  Vital signs in last 24 hours: Temp:  [98.7 F (37.1 C)-102.7 F (39.3 C)] 98.7 F (37.1 C) (11/08 1220) Pulse Rate:   [72-100] 72 (11/08 1220) Resp:  [16-28] 20 (11/08 1220) BP: (95-131)/(40-58) 124/40 (11/08 1220) SpO2:  [92 %-98 %] 98 % (11/08 1220) Weight:  [91.8 kg] 91.8 kg (11/07 2339)  General:  Alert and oriented, No acute distress HEENT: Normocephalic, atraumatic Neck: No JVD or lymphadenopathy Cardiovascular: Regular rate and rhythm Lungs: Regular rate and effort Abdomen: Soft, nontender, nondistended, no abdominal masses Back: No CVA tenderness, left-sided nephrostomy tube in place that was capped.  When I uncapped it, clear yellow urine poured out.  Flushed easily with 10 cc of saline flush. Extremities: No edema Neurologic: Grossly intact  Laboratory Data:  Results for orders placed or performed during the hospital encounter of 05/14/24 (from the past 24 hours)  Troponin I (High Sensitivity)     Status: None   Collection Time: 05/14/24  7:24 PM  Result Value Ref Range   Troponin I (High Sensitivity) 8 <18 ng/L  Lactic acid, plasma     Status:  None   Collection Time: 05/14/24  7:24 PM  Result Value Ref Range   Lactic Acid, Venous 1.0 0.5 - 1.9 mmol/L  Comprehensive metabolic panel     Status: Abnormal   Collection Time: 05/14/24  7:24 PM  Result Value Ref Range   Sodium 136 135 - 145 mmol/L   Potassium 4.1 3.5 - 5.1 mmol/L   Chloride 106 98 - 111 mmol/L   CO2 21 (L) 22 - 32 mmol/L   Glucose, Bld 160 (H) 70 - 99 mg/dL   BUN 19 8 - 23 mg/dL   Creatinine, Ser 9.08 0.44 - 1.00 mg/dL   Calcium  8.9 8.9 - 10.3 mg/dL   Total Protein 7.3 6.5 - 8.1 g/dL   Albumin 3.1 (L) 3.5 - 5.0 g/dL   AST 25 15 - 41 U/L   ALT 11 0 - 44 U/L   Alkaline Phosphatase 55 38 - 126 U/L   Total Bilirubin 1.0 0.0 - 1.2 mg/dL   GFR, Estimated >39 >39 mL/min   Anion gap 9 5 - 15  CBC with Differential     Status: Abnormal   Collection Time: 05/14/24  7:24 PM  Result Value Ref Range   WBC 10.0 4.0 - 10.5 K/uL   RBC 4.20 3.87 - 5.11 MIL/uL   Hemoglobin 11.0 (L) 12.0 - 15.0 g/dL   HCT 64.2 (L) 63.9 - 53.9 %    MCV 85.0 80.0 - 100.0 fL   MCH 26.2 26.0 - 34.0 pg   MCHC 30.8 30.0 - 36.0 g/dL   RDW 81.2 (H) 88.4 - 84.4 %   Platelets 261 150 - 400 K/uL   nRBC 0.0 0.0 - 0.2 %   Neutrophils Relative % 90 %   Neutro Abs 8.9 (H) 1.7 - 7.7 K/uL   Lymphocytes Relative 7 %   Lymphs Abs 0.7 0.7 - 4.0 K/uL   Monocytes Relative 3 %   Monocytes Absolute 0.3 0.1 - 1.0 K/uL   Eosinophils Relative 0 %   Eosinophils Absolute 0.0 0.0 - 0.5 K/uL   Basophils Relative 0 %   Basophils Absolute 0.0 0.0 - 0.1 K/uL   Immature Granulocytes 0 %   Abs Immature Granulocytes 0.04 0.00 - 0.07 K/uL  Blood Culture (routine x 2)     Status: None (Preliminary result)   Collection Time: 05/14/24  7:24 PM   Specimen: BLOOD  Result Value Ref Range   Specimen Description BLOOD BLOOD LEFT ARM    Special Requests      BOTTLES DRAWN AEROBIC AND ANAEROBIC Blood Culture results may not be optimal due to an inadequate volume of blood received in culture bottles   Culture      NO GROWTH < 12 HOURS Performed at [facility], [address] [place], [place] [address]    Report Status PENDING   Blood Culture (routine x 2)     Status: None (Preliminary result)   Collection Time: 05/14/24  7:24 PM   Specimen: BLOOD  Result Value Ref Range   Specimen Description BLOOD BLOOD RIGHT HAND    Special Requests      BOTTLES DRAWN AEROBIC AND ANAEROBIC Blood Culture adequate volume   Culture      NO GROWTH < 12 HOURS Performed at [facility], [address] [place], [place] [address]    Report Status PENDING   Resp panel by RT-PCR (RSV, Flu A&B, Covid) Anterior Nasal Swab     Status: None   Collection Time: 05/14/24  7:47 PM   Specimen: Anterior Nasal Swab  Result Value Ref Range   SARS Coronavirus 2 by RT PCR NEGATIVE NEGATIVE   Influenza A by PCR NEGATIVE NEGATIVE   Influenza B by PCR NEGATIVE NEGATIVE   Resp Syncytial Virus by PCR NEGATIVE NEGATIVE  CBC     Status: Abnormal   Collection Time: 05/15/24   4:44 AM  Result Value Ref Range   WBC 11.3 (H) 4.0 - 10.5 K/uL   RBC 3.71 (L) 3.87 - 5.11 MIL/uL   Hemoglobin 9.5 (L) 12.0 - 15.0 g/dL   HCT 68.7 (L) 63.9 - 53.9 %   MCV 84.1 80.0 - 100.0 fL   MCH 25.6 (L) 26.0 - 34.0 pg   MCHC 30.4 30.0 - 36.0 g/dL   RDW 81.3 (H) 88.4 - 84.4 %   Platelets 254 150 - 400 K/uL   nRBC 0.0 0.0 - 0.2 %  Basic metabolic panel     Status: Abnormal   Collection Time: 05/15/24  4:44 AM  Result Value Ref Range   Sodium 137 135 - 145 mmol/L   Potassium 4.0 3.5 - 5.1 mmol/L   Chloride 105 98 - 111 mmol/L   CO2 22 22 - 32 mmol/L   Glucose, Bld 129 (H) 70 - 99 mg/dL   BUN 19 8 - 23 mg/dL   Creatinine, Ser 9.02 0.44 - 1.00 mg/dL   Calcium  8.3 (L) 8.9 - 10.3 mg/dL   GFR, Estimated >39 >39 mL/min   Anion gap 10 5 - 15   Recent Results (from the past 240 hours)  Blood Culture (routine x 2)     Status: None (Preliminary result)   Collection Time: 05/14/24  7:24 PM   Specimen: BLOOD  Result Value Ref Range Status   Specimen Description BLOOD BLOOD LEFT ARM  Final   Special Requests   Final    BOTTLES DRAWN AEROBIC AND ANAEROBIC Blood Culture results may not be optimal due to an inadequate volume of blood received in culture bottles   Culture   Final    NO GROWTH < 12 HOURS Performed at [facility], [address] [place], [place] [address]    Report Status PENDING  Incomplete  Blood Culture (routine x 2)     Status: None (Preliminary result)   Collection Time: 05/14/24  7:24 PM   Specimen: BLOOD  Result Value Ref Range Status   Specimen Description BLOOD BLOOD RIGHT HAND  Final   Special Requests   Final    BOTTLES DRAWN AEROBIC AND ANAEROBIC Blood Culture adequate volume   Culture   Final    NO GROWTH < 12 HOURS Performed at [facility], [address] [place], [place] [address]    Report Status PENDING  Incomplete  Resp panel by RT-PCR (RSV, Flu A&B, Covid) Anterior Nasal Swab     Status: None   Collection Time: 05/14/24   7:47 PM   Specimen: Anterior Nasal Swab  Result Value Ref Range Status   SARS Coronavirus 2 by RT PCR NEGATIVE NEGATIVE Final    Comment: (NOTE) SARS-CoV-2 target nucleic acids are NOT DETECTED.  The SARS-CoV-2 RNA is generally detectable in upper respiratory specimens during the acute phase of infection. The lowest concentration of SARS-CoV-2 viral copies this assay can detect is 138 copies/mL. A negative result does not preclude SARS-Cov-2 infection and should not be used as the sole basis for treatment or other patient management decisions. A negative result may occur with  improper  specimen collection/handling, submission of specimen other than nasopharyngeal swab, presence of viral mutation(s) within the areas targeted by this assay, and inadequate number of viral copies(<138 copies/mL). A negative result must be combined with clinical observations, patient history, and epidemiological information. The expected result is Negative.  Fact Sheet for Patients:  bloggercourse.com  Fact Sheet for Healthcare Providers:  seriousbroker.it  This test is no t yet approved or cleared by the [place]  FDA and  has been authorized for detection and/or diagnosis of SARS-CoV-2 by FDA under an Emergency Use Authorization (EUA). This EUA will remain  in effect (meaning this test can be used) for the duration of the COVID-19 declaration under Section 564(b)(1) of the Act, 21 U.S.C.section 360bbb-3(b)(1), unless the authorization is terminated  or revoked sooner.       Influenza A by PCR NEGATIVE NEGATIVE Final   Influenza B by PCR NEGATIVE NEGATIVE Final    Comment: (NOTE) The Xpert Xpress SARS-CoV-2/FLU/RSV plus assay is intended as an aid in the diagnosis of influenza from Nasopharyngeal swab specimens and should not be used as a sole basis for treatment. Nasal washings and aspirates are unacceptable for Xpert Xpress  SARS-CoV-2/FLU/RSV testing.  Fact Sheet for Patients: bloggercourse.com  Fact Sheet for Healthcare Providers: seriousbroker.it  This test is not yet approved or cleared by the [place]  FDA and has been authorized for detection and/or diagnosis of SARS-CoV-2 by FDA under an Emergency Use Authorization (EUA). This EUA will remain in effect (meaning this test can be used) for the duration of the COVID-19 declaration under Section 564(b)(1) of the Act, 21 U.S.C. section 360bbb-3(b)(1), unless the authorization is terminated or revoked.     Resp Syncytial Virus by PCR NEGATIVE NEGATIVE Final    Comment: (NOTE) Fact Sheet for Patients: bloggercourse.com  Fact Sheet for Healthcare Providers: seriousbroker.it  This test is not yet approved or cleared by the [place]  FDA and has been authorized for detection and/or diagnosis of SARS-CoV-2 by FDA under an Emergency Use Authorization (EUA). This EUA will remain in effect (meaning this test can be used) for the duration of the COVID-19 declaration under Section 564(b)(1) of the Act, 21 U.S.C. section 360bbb-3(b)(1), unless the authorization is terminated or revoked.  Performed at [facility], [address] [place], [place] [address]    Creatinine: Recent Labs    05/14/24 1924 05/15/24 0444  CREATININE 0.91 0.97   CT scan personally reviewed and is detailed in the history of present illness  Impression/Assessment:  Left hydronephrosis Malfunctioning left nephroureteral stent  Plan:  As recommended last night, the nephrostomy tube should be uncapped and placed to drainage.  I flushed it and the tube is patent.  I put the Back on Because I did not have a drainage bag but charge nurse has found a bag and will place it to drainage.  Continue antibiotics.  With it draining appropriately, I think her exchange can  wait until Monday.  Probably safer to wait until she has been on a couple days of antibiotics before manipulating the system and placing a fresh catheter anyway as long as she remains clinically stable.  If her condition declines, should be exchanged earlier.  Greater than 60 minutes was spent in the evaluation of the patient including reviewing her records, examining patient, evaluating her nephrostomy tube, communicating with nursing and interventional radiology.  Sherwood JONETTA Edison, III 05/15/2024, 2:01 PM

## 2024-05-15 NOTE — Plan of Care (Signed)
  Problem: Health Behavior/Discharge Planning: Goal: Ability to manage health-related needs will improve Outcome: Progressing   Problem: Clinical Measurements: Goal: Ability to maintain clinical measurements within normal limits will improve Outcome: Progressing Goal: Diagnostic test results will improve Outcome: Progressing Goal: Cardiovascular complication will be avoided Outcome: Progressing

## 2024-05-15 NOTE — Progress Notes (Signed)
 PHARMACY - PHYSICIAN COMMUNICATION CRITICAL VALUE ALERT - BLOOD CULTURE IDENTIFICATION (BCID)  Courtney Mack is an 68 y.o. female who presented to [facility] on [facility]/14/2024 with a chief complaint of flank pain and fever. Code sepsis with possibly blocked nephroureteral tube.  Assessment:  chronic left-sided hydronephrosis, frequent UTIs   Name of physician (or Provider) Contacted: DOROTHA Pouch  Current antibiotics: Meropenem  for hx fo ESBL infection  Changes to prescribed antibiotics recommended:  Vancomycin  per pharmacy today. Provider will repeat blood cultures and re-assess tomorrow.  Results for orders placed or performed during the hospital encounter of 05/14/24  Blood Culture ID Panel (Reflexed) (Collected: 05/14/2024  7:24 PM)  Result Value Ref Range   Enterococcus faecalis NOT DETECTED NOT DETECTED   Enterococcus Faecium NOT DETECTED NOT DETECTED   Listeria monocytogenes NOT DETECTED NOT DETECTED   Staphylococcus species DETECTED (A) NOT DETECTED   Staphylococcus aureus (BCID) NOT DETECTED NOT DETECTED   Staphylococcus epidermidis DETECTED (A) NOT DETECTED   Staphylococcus lugdunensis NOT DETECTED NOT DETECTED   Streptococcus species NOT DETECTED NOT DETECTED   Streptococcus agalactiae NOT DETECTED NOT DETECTED   Streptococcus pneumoniae NOT DETECTED NOT DETECTED   Streptococcus pyogenes NOT DETECTED NOT DETECTED   A.calcoaceticus-baumannii NOT DETECTED NOT DETECTED   Bacteroides fragilis NOT DETECTED NOT DETECTED   Enterobacterales NOT DETECTED NOT DETECTED   Enterobacter cloacae complex NOT DETECTED NOT DETECTED   Escherichia coli NOT DETECTED NOT DETECTED   Klebsiella aerogenes NOT DETECTED NOT DETECTED   Klebsiella oxytoca NOT DETECTED NOT DETECTED   Klebsiella pneumoniae NOT DETECTED NOT DETECTED   Proteus species NOT DETECTED NOT DETECTED   Salmonella species NOT DETECTED NOT DETECTED   Serratia marcescens NOT DETECTED NOT DETECTED   Haemophilus influenzae NOT  DETECTED NOT DETECTED   Neisseria meningitidis NOT DETECTED NOT DETECTED   Pseudomonas aeruginosa NOT DETECTED NOT DETECTED   Stenotrophomonas maltophilia NOT DETECTED NOT DETECTED   Candida albicans NOT DETECTED NOT DETECTED   Candida auris NOT DETECTED NOT DETECTED   Candida glabrata NOT DETECTED NOT DETECTED   Candida krusei NOT DETECTED NOT DETECTED   Candida parapsilosis NOT DETECTED NOT DETECTED   Candida tropicalis NOT DETECTED NOT DETECTED   Cryptococcus neoformans/gattii NOT DETECTED NOT DETECTED   Methicillin resistance mecA/C DETECTED (A) NOT DETECTED   Dayne Dekay Rodriguez-Guzman PharmD, BCPS 05/15/2024 5:13 PM

## 2024-05-15 NOTE — H&P (Addendum)
 History and Physical    Patient: Courtney Mack FMW:[id number] DOB: 14-Jun-1956 DOA: 05/14/2024 DOS: the patient was seen and examined on 05/15/2024 PCP: [facility]  Patient coming from: Home  Chief Complaint:  Chief Complaint  Patient presents with   Code Sepsis    HPI: Courtney Mack is a 68 y.o. female with medical history significant for nephrostomy secondary to chronic left-sided hydronephrosis, frequent UTIs requiring hospitalization most recently for ESBL E. coli 04/06/2024, as well as history of HTN, COPD, morbid obesity, PE/DVT s/p IVC filter on Lovenox  shots, endometrial cancer, history of GI bleed, HFpEF,  being admitted with a sepsis secondary to complicated UTI and concerns for a possibly blocked nephroureteral tube.  She presented with a fever of 101.2 and flank pain.  Patient has been on prophylactic trimethoprim  since February 2025. In the ED, temp 100.3 with heart rate of 100, mild tachypnea to 22, BP 131/45 O2 sat 94% on room air. Labs notable for WBC of 10 and lactic acid 1.0, hemoglobin 11.  CMP for the most part unremarkable, troponin 8, respiratory viral panel negative for COVID flu and RSV EKG showed sinus at 89 CT abdomen and pelvis with contrast showed nephroureteral catheter in satisfactory position, left-sided hydronephrosis but with decompressed bladder raising suspicion for catheter malfunction or kinking, new perinephric stranding.  Nonobstructive right renal calculus  The ED provider spoke with urologist, Dr. Carolee who recommended uncapping the nephrostomy tube and placing a bag to allow drainage in case of blockage of the nephroureteral catheter as well as IR consult for exchange  Patient started on meropenem  due to recent ESBL  Admission requested     Past Medical History:  Diagnosis Date   Acute bilateral deep vein thrombosis (DVT) of femoral veins (HCC) 01/02/2021   Acute massive pulmonary embolism (HCC) 08/25/2020    Last Assessment & Plan:  Formatting of this note might be different from the original. Post vena caval filter and on xarelto  and O2   Anemia    ARF (acute respiratory failure) (HCC)    Cellulitis of left lower leg 06/20/2021   CHF (congestive heart failure) (HCC)    COVID-19    Diabetes mellitus without complication (HCC)    DVT (deep venous thrombosis) (HCC) 09/06/2020   Endometrial cancer (HCC)    GAVE (gastric antral vascular ectasia)    GERD (gastroesophageal reflux disease)    High cholesterol    Hx of blood clots    Hyperlipidemia    Hypertension    IDA (iron  deficiency anemia) 09/21/2020   Obesity    Pneumonia 06/11/2022   recovered   Pressure injury of skin 06/21/2021   Pulmonary embolism (HCC)    Sepsis (HCC)    Thrombocytopenia    Urinary tract infection 09/13/2021   Past Surgical History:  Procedure Laterality Date   CATARACT EXTRACTION W/PHACO Right 06/04/2022   Procedure: CATARACT EXTRACTION PHACO AND INTRAOCULAR LENS PLACEMENT (IOC) COMPLICATED RIGHT  31.83  02:17.4;  Surgeon: Jaye Fallow, MD;  Location: [facility];  Service: Ophthalmology;  Laterality: Right;   CATARACT EXTRACTION W/PHACO Left 07/09/2022   Procedure: CATARACT EXTRACTION PHACO AND INTRAOCULAR LENS PLACEMENT (IOC);  Surgeon: Jaye Fallow, MD;  Location: [facility];  Service: Ophthalmology;  Laterality: Left;  22.75 1:40.3   COLONOSCOPY N/A 06/21/2021   Procedure: COLONOSCOPY;  Surgeon: Toledo, Ladell POUR, MD;  Location: [facility];  Service: Gastroenterology;  Laterality: N/A;   CYSTOSCOPY W/ URETERAL STENT PLACEMENT Left 09/11/2021   Procedure:  CYSTOSCOPY WITH RETROGRADE PYELOGRAM/URETERAL STENT PLACEMENT;  Surgeon: Twylla Glendia BROCKS, MD;  Location: [facility];  Service: Urology;  Laterality: Left;   CYSTOSCOPY W/ URETERAL STENT PLACEMENT Left 11/17/2021   Procedure: CYSTOSCOPY WITH STENT REPLACEMENT;  Surgeon: Watt Rush, MD;  Location: [facility];  Service: Urology;   Laterality: Left;   CYSTOSCOPY WITH FULGERATION  11/17/2021   Procedure: CYSTOSCOPY WITH FULGERATION;  Surgeon: Watt Rush, MD;  Location: [facility];  Service: Urology;;   CYSTOSCOPY WITH STENT PLACEMENT Left 10/17/2021   Procedure: CYSTOSCOPY WITH STENT PLACEMENT;  Surgeon: Twylla Glendia BROCKS, MD;  Location: [facility];  Service: Urology;  Laterality: Left;   CYSTOSCOPY WITH URETEROSCOPY, STONE BASKETRY AND STENT PLACEMENT Left 10/16/2021   Procedure: CYSTOSCOPY WITH URETEROSCOPY  AND STENT REMOVAL;  Surgeon: Twylla Glendia BROCKS, MD;  Location: [facility];  Service: Urology;  Laterality: Left;   ENTEROSCOPY N/A 08/17/2021   Procedure: ENTEROSCOPY;  Surgeon: Unk Corinn Skiff, MD;  Location: [facility];  Service: Gastroenterology;  Laterality: N/A;   ESOPHAGOGASTRODUODENOSCOPY N/A 06/21/2021   Procedure: ESOPHAGOGASTRODUODENOSCOPY (EGD);  Surgeon: Toledo, Ladell POUR, MD;  Location: [facility];  Service: Gastroenterology;  Laterality: N/A;   ESOPHAGOGASTRODUODENOSCOPY (EGD) WITH PROPOFOL  N/A 08/17/2021   Procedure: ESOPHAGOGASTRODUODENOSCOPY (EGD) WITH PROPOFOL ;  Surgeon: Unk Corinn Skiff, MD;  Location: [facility];  Service: Gastroenterology;  Laterality: N/A;   ESOPHAGOGASTRODUODENOSCOPY (EGD) WITH PROPOFOL  N/A 10/14/2021   Procedure: ESOPHAGOGASTRODUODENOSCOPY (EGD) WITH PROPOFOL ;  Surgeon: Jinny Carmine, MD;  Location: [facility];  Service: Endoscopy;  Laterality: N/A;   ESOPHAGOGASTRODUODENOSCOPY (EGD) WITH PROPOFOL  N/A 02/01/2022   Procedure: ESOPHAGOGASTRODUODENOSCOPY (EGD) WITH PROPOFOL ;  Surgeon: Unk Corinn Skiff, MD;  Location: [facility];  Service: Gastroenterology;  Laterality: N/A;   ESOPHAGOGASTRODUODENOSCOPY (EGD) WITH PROPOFOL  N/A 03/15/2022   Procedure: ESOPHAGOGASTRODUODENOSCOPY (EGD) WITH PROPOFOL ;  Surgeon: Therisa Bi, MD;  Location: [facility] ENDOSCOPY;  Service: Gastroenterology;  Laterality: N/A;   ESOPHAGOGASTRODUODENOSCOPY (EGD) WITH PROPOFOL  N/A 04/18/2022   Procedure:  ESOPHAGOGASTRODUODENOSCOPY (EGD) WITH PROPOFOL ;  Surgeon: Unk Corinn Skiff, MD;  Location: [facility];  Service: Gastroenterology;  Laterality: N/A;   ESOPHAGOGASTRODUODENOSCOPY (EGD) WITH PROPOFOL  N/A 08/09/2022   Procedure: ESOPHAGOGASTRODUODENOSCOPY (EGD) WITH PROPOFOL ;  Surgeon: Therisa Bi, MD;  Location: [facility] ENDOSCOPY;  Service: Gastroenterology;  Laterality: N/A;   GIVENS CAPSULE STUDY N/A 06/22/2021   Procedure: GIVENS CAPSULE STUDY;  Surgeon: Toledo, Ladell POUR, MD;  Location: [facility];  Service: Gastroenterology;  Laterality: N/A;   IR CONVERT LEFT NEPHROSTOMY TO NEPHROURETERAL CATH  08/29/2023   IR EXT NEPHROURETERAL CATH EXCHANGE  03/05/2024   IR NEPHROSTOMY EXCHANGE LEFT  02/25/2022   IR NEPHROSTOMY EXCHANGE LEFT  03/27/2022   IR NEPHROSTOMY EXCHANGE LEFT  05/22/2022   IR NEPHROSTOMY EXCHANGE LEFT  07/24/2022   IR NEPHROSTOMY EXCHANGE LEFT  09/24/2022   IR NEPHROSTOMY EXCHANGE LEFT  11/21/2022   IR NEPHROSTOMY EXCHANGE LEFT  02/17/2023   IR NEPHROSTOMY EXCHANGE LEFT  04/06/2023   IR NEPHROSTOMY EXCHANGE LEFT  05/27/2023   IR NEPHROSTOMY EXCHANGE LEFT  07/28/2023   IR NEPHROSTOMY PLACEMENT LEFT  12/27/2021   IVC FILTER INSERTION N/A 08/18/2020   Procedure: IVC FILTER INSERTION;  Surgeon: Jama Cordella MATSU, MD;  Location: [facility];  Service: Cardiovascular;  Laterality: N/A;   PERIPHERAL VASCULAR THROMBECTOMY Bilateral 01/03/2021   Procedure: PERIPHERAL VASCULAR THROMBECTOMY;  Surgeon: Marea Selinda RAMAN, MD;  Location: [facility];  Service: Cardiovascular;  Laterality: Bilateral;   PULMONARY THROMBECTOMY N/A 08/18/2020   Procedure: PULMONARY THROMBECTOMY;  Surgeon: Jama Cordella MATSU, MD;  Location: [facility];  Service: Cardiovascular;  Laterality: N/A;   TUBAL LIGATION     VISCERAL ANGIOGRAPHY N/A 07/18/2021   Procedure: VISCERAL ANGIOGRAPHY;  Surgeon: Marea Selinda RAMAN, MD;  Location: [facility];  Service: Cardiovascular;  Laterality: N/A;    Social History:  reports that she has never smoked. She has never used smokeless tobacco. She reports that she does not currently use alcohol . She reports that she does not use drugs.  No Known Allergies  Family History  Problem Relation Age of Onset   Hypertension Mother    Diabetes Mother    Diabetes Father    Hypertension Father    High Cholesterol Father    Congestive Heart Failure Father    Breast cancer Cousin     Prior to Admission medications   Medication Sig Start Date End Date Taking? Authorizing Provider  acetaminophen  (TYLENOL ) 500 MG tablet Take 500 mg by mouth every 6 (six) hours as needed for mild pain or fever.   Yes [provider]  albuterol  (VENTOLIN  HFA) 108 (90 Base) MCG/ACT inhaler Inhale 2 puffs into the lungs every 6 (six) hours as needed for wheezing or shortness of breath. Patient taking differently: Inhale 2 puffs into the lungs every 4 (four) hours as needed for wheezing or shortness of breath. 10/26/22  Yes Laurita Pillion, MD  Ascorbic Acid  (VITAMIN C ) 1000 MG tablet Take 1,000 mg by mouth daily.   Yes [provider]  cyanocobalamin  100 MCG tablet Take 100 mcg by mouth daily.   Yes [provider]  enoxaparin  (LOVENOX ) 30 MG/0.3ML injection Inject 0.3 mLs (30 mg total) into the skin every 12 (twelve) hours. 08/10/23  Yes Barbarann Nest, MD  fluticasone  furoate-vilanterol (BREO ELLIPTA ) 100-25 MCG/ACT AEPB Inhale 1 puff into the lungs daily.   Yes [provider]  furosemide  (LASIX ) 20 MG tablet Take 20 mg by mouth daily. 04/15/23  Yes [provider]  loperamide  (IMODIUM ) 2 MG capsule Take 2 mg by mouth as needed for diarrhea or loose stools. 08/14/23  Yes [provider]  Multiple Vitamin (MULTIVITAMIN WITH MINERALS) TABS tablet Take 1 tablet by mouth daily. 04/21/24  Yes Awanda City, MD  pantoprazole  (PROTONIX ) 40 MG tablet Take 40 mg by mouth 2 (two) times daily. 01/01/24  Yes [provider]   rosuvastatin  (CRESTOR ) 10 MG tablet TAKE 1 TABLET(10 MG) BY MOUTH DAILY 12/26/23  Yes Donette City A, FNP  simethicone  (MYLICON) 80 MG chewable tablet Chew 1 tablet (80 mg total) by mouth every 6 (six) hours as needed for flatulence. 04/08/23  Yes Wouk, Devaughn Sayres, MD  trimethoprim  (TRIMPEX ) 100 MG tablet Take 1 tablet (100 mg total) by mouth at bedtime. 08/10/23  Yes Barbarann Nest, MD    Physical Exam: Vitals:   05/14/24 1901 05/14/24 1930 05/14/24 2230 05/14/24 2339  BP:  (!) 126/51 (!) 110/58   Pulse:  84 78   Resp:   18   Temp:      TempSrc:      SpO2:  92% 98%   Weight:    91.8 kg  Height: 4' 11 (1.499 m)   4' 11 (1.499 m)   Physical Exam Vitals and nursing note reviewed.  Constitutional:      General: She is not in acute distress. HENT:     Head: Normocephalic and atraumatic.  Cardiovascular:     Rate and Rhythm: Normal rate and regular rhythm.     Heart sounds: Normal heart sounds.  Pulmonary:     Effort: Pulmonary effort is normal.     Breath sounds: Normal breath sounds.  Abdominal:     Palpations: Abdomen is soft.     Tenderness: There is no abdominal tenderness.  Neurological:     Mental Status: Mental status is at baseline.     Labs on Admission: I have personally reviewed following labs and imaging studies  CBC: Recent Labs  Lab 05/14/24 1924  WBC 10.0  NEUTROABS 8.9*  HGB 11.0*  HCT 35.7*  MCV 85.0  PLT 261   Basic Metabolic Panel: Recent Labs  Lab 05/14/24 1924  NA 136  K 4.1  CL 106  CO2 21*  GLUCOSE 160*  BUN 19  CREATININE 0.91  CALCIUM  8.9   GFR: Estimated Creatinine Clearance: 57.7 mL/min (by C-G formula based on SCr of 0.91 mg/dL). Liver Function Tests: Recent Labs  Lab 05/14/24 1924  AST 25  ALT 11  ALKPHOS 55  BILITOT 1.0  PROT 7.3  ALBUMIN 3.1*   No results for input(s): LIPASE, AMYLASE in the last 168 hours. No results for input(s): AMMONIA in the last 168 hours. Coagulation Profile: No results for  input(s): INR, PROTIME in the last 168 hours. Cardiac Enzymes: No results for input(s): CKTOTAL, CKMB, CKMBINDEX, TROPONINI in the last 168 hours. BNP (last 3 results) No results for input(s): PROBNP in the last 8760 hours. HbA1C: No results for input(s): HGBA1C in the last 72 hours. CBG: No results for input(s): GLUCAP in the last 168 hours. Lipid Profile: No results for input(s): CHOL, HDL, LDLCALC, TRIG, CHOLHDL, LDLDIRECT in the last 72 hours. Thyroid Function Tests: No results for input(s): TSH, T4TOTAL, FREET4, T3FREE, THYROIDAB in the last 72 hours. Anemia Panel: No results for input(s): VITAMINB12, FOLATE, FERRITIN, TIBC, IRON , RETICCTPCT in the last 72 hours. Urine analysis:    Component Value Date/Time   COLORURINE YELLOW (A) 04/16/2024 1517   APPEARANCEUR CLOUDY (A) 04/16/2024 1517   APPEARANCEUR Cloudy (A) 01/03/2022 1443   LABSPEC 1.018 04/16/2024 1517   PHURINE 6.0 04/16/2024 1517   GLUCOSEU NEGATIVE 04/16/2024 1517   HGBUR NEGATIVE 04/16/2024 1517   BILIRUBINUR NEGATIVE 04/16/2024 1517   BILIRUBINUR Negative 01/03/2022 1443   KETONESUR NEGATIVE 04/16/2024 1517   PROTEINUR 100 (A) 04/16/2024 1517   NITRITE NEGATIVE 04/16/2024 1517   LEUKOCYTESUR LARGE (A) 04/16/2024 1517    Radiological Exams on Admission: CT ABDOMEN PELVIS W CONTRAST Result Date: 05/14/2024 EXAM: CT ABDOMEN AND PELVIS WITH CONTRAST 05/14/2024 08:56:58 PM TECHNIQUE: CT of the abdomen and pelvis was performed with the administration of 100 mL of iohexol  (OMNIPAQUE ) 300 MG/ML solution. Multiplanar reformatted images are provided for review. Automated exposure control, iterative reconstruction, and/or weight-based adjustment of the mA/kV was utilized to reduce the radiation dose to as low as reasonably achievable. COMPARISON: Comparison study 04/06/2024. CLINICAL HISTORY: Possible sepsis. FINDINGS: LOWER CHEST: Lung bases demonstrate some mild scarring.  LIVER: The liver is within normal limits. GALLBLADDER AND BILE DUCTS: Gallbladder is within normal limits. No biliary ductal dilatation. SPLEEN: The spleen is within normal limits. PANCREAS: The pancreas is unremarkable. ADRENAL GLANDS: No acute abnormality. KIDNEYS, URETERS AND BLADDER: Considerable perinephric stranding is noted on the left, new from the prior exam. A nephroureteral catheter is noted in satisfactory position. Left-sided hydronephrosis is noted with a decompressed urinary bladder, raising suspicion for catheter malfunction or kinking. A right renal calculus is noted measuring approximately 6 mm. No obstructive changes are seen on the right. The urinary bladder is decompressed. GI  AND BOWEL: Stomach and small bowel are within normal limits. The colon shows scattered fecal material throughout. No obstructive changes are seen. No inflammatory changes are noted. The appendix is not well visualized and may have been surgically removed. PERITONEUM AND RETROPERITONEUM: No free fluid or free air is identified. VASCULATURE: Aortic calcifications are seen without an aneurysmal dilatation. IVC filter is seen in place. LYMPH NODES: No lymphadenopathy. REPRODUCTIVE ORGANS: Uterus is within normal limits. No adnexal mass is seen. BONES AND SOFT TISSUES: Degenerative changes of the hip joints are noted bilaterally. Lumbar spine degenerative changes are noted as well. IMPRESSION: 1. Left-sided hydronephrosis with considerable perinephric stranding, new from the prior exam, with nephroureteral catheter in satisfactory position; the decompressed urinary bladder raises suspicion for catheter malfunction or kinking. 2. Right renal calculus measuring approximately 6 mm without obstructive changes. Electronically signed by: Oneil Devonshire MD 05/14/2024 09:23 PM EST RP Workstation: [id number]   DG Chest Port 1 View Result Date: 05/14/2024 EXAM: 1 VIEW(S) XRAY OF THE CHEST 05/14/2024 07:32:00 PM COMPARISON: 04/16/2024  CLINICAL HISTORY: Questionable sepsis - evaluate for abnormality FINDINGS: LUNGS AND PLEURA: Low lung volumes. No focal pulmonary opacity. No pulmonary edema. No pleural effusion. No pneumothorax. HEART AND MEDIASTINUM: Aortic atherosclerosis. No acute abnormality of the cardiac and mediastinal silhouettes. BONES AND SOFT TISSUES: No acute osseous abnormality. IMPRESSION: 1. No acute findings. Electronically signed by: Norman Gatlin MD 05/14/2024 08:02 PM EST RP Workstation: [id number]   Data Reviewed for HPI: Relevant notes from primary care and specialist visits, past discharge summaries as available in EHR, including Care Everywhere. Prior diagnostic testing as pertinent to current admission diagnoses Updated medications and problem lists for reconciliation ED course, including vitals, labs, imaging, treatment and response to treatment Triage notes, nursing and pharmacy notes and ED provider's notes Notable results as noted above in HPI      Assessment and Plan: Acute pyelonephritis Suspect sepsis History of ESBL E. coli UTI 04/06/2024 Possible obstruction of left nephrostomy/nephroureteral catheter Patient with frequent UTI including ESBL E. coli presenting with fever and tachycardia and left flank pain, pyelonephritis on CT Continue meropenem  given recent ESBL IV fluids per sepsis protocol Follow cultures Urology consulted due to possible obstruction (hydronephrosis with decompressed bladder in spite of nephroureteral catheter) IR consulted for nephrostomy exchange as recommended by urology on ER consult  COPD (chronic obstructive pulmonary disease) (HCC) Not acutely exacerbated DuoNebs as needed  Chronic diastolic CHF (congestive heart failure) (HCC) Clinically euvolemic Monitor for fluid overload given IV fluids in the management of sepsis  HTN (hypertension) Will hold blood pressure meds tonight in the setting of sepsis diagnosis  Endometrial cancer (HCC) No acute issues  suspected  History of pulmonary embolism/DVT s/p IVC filter No acute issues suspected  Obesity, Class III, BMI 40-49.9 (morbid obesity) (HCC) Complicating factor to overall prognosis and care     DVT prophylaxis: Continuing home Lovenox -hold tonight's dose for procedure  Consults: urology to follow, IR  Advance Care Planning:   Code Status: Prior   Family Communication: none  Disposition Plan: Back to previous home environment  Severity of Illness: The appropriate patient status for this patient is OBSERVATION. Observation status is judged to be reasonable and necessary in order to provide the required intensity of service to ensure the patient's safety. The patient's presenting symptoms, physical exam findings, and initial radiographic and laboratory data in the context of their medical condition is felt to place them at decreased risk for further clinical deterioration. Furthermore, it is anticipated that the  patient will be medically stable for discharge from the hospital within 2 midnights of admission.   Author: Delayne LULLA Solian, MD 05/15/2024 12:07 AM  For on call review [email or web address].

## 2024-05-15 NOTE — Progress Notes (Signed)
 PROGRESS NOTE    Courtney Mack  FMW:[id number] DOB: 1955/10/07 DOA: 05/14/2024 PCP: [facility]   Assessment & Plan:   Active Problems:   Acute pyelonephritis   History of infection due to ESBL Escherichia coli   Hydronephrosis of left kidney   COPD (chronic obstructive pulmonary disease) (HCC)   Blocked nephrostomy tube with hydronephrosis and hydroureter (HCC)   Complicated UTI (urinary tract infection)   Chronic diastolic CHF (congestive heart failure) (HCC)   HTN (hypertension)   Endometrial cancer (HCC)   History of pulmonary embolism/DVT s/p IVC filter   Obesity, Class III, BMI 40-49.9 (morbid obesity) (HCC)  Assessment and Plan:  Acute pyelonephritis: continue on IV merrem  secondary to recent hx of ESBL. Urine cx taken after abxs were started so likely urine cx will not grow anything. Uro following and recs apprec  Sepsis: met criteria w/ fever, leukocytosis, tachypnea & acute pyelonephritis. Blood cxs are pending   Hx of left nephrostomy/nephroureteral catheter: nephroureteral catheter in satisfactory position as per CT. Will have possibly left nephroureteral catheter exchange as per IR    COPD: w/o exacerbation. Bronchodilators prn    Chronic diastolic CHF: appears compensated. Monitor I/Os   HTN: holding all anti-HTN meds secondary to sepsis/above infection   HLD: continue on statin    Endometrial cancer: management per onco outpatient    Hx of pulmonary embolism/DVT: s/p IVC filter.    Morbid obesity: BMI 40.8. Complicates overall care & prognosis        DVT prophylaxis: lovenox   Code Status: full  Family Communication: called pt's daughter, Ronal Amble, but pt was not available right now b/c she went to the store as per person that answered the phone  Disposition Plan: depends on PT/OT recs (not consulted yet)  Level of care: Med-Surg  Status is: Observation The patient remains OBS appropriate and will d/c before 2  midnights.    Consultants:  Uro IR  Procedures:   Antimicrobials: merrem    Subjective: Pt c/o malaise  Objective: Vitals:   05/15/24 0336 05/15/24 0545 05/15/24 0553 05/15/24 0820  BP: (!) 103/45 (!) 100/46  (!) 95/40  Pulse: 95 97  75  Resp: (!) 24 (!) 28 (!) 24 20  Temp: (!) 102.7 F (39.3 C) (!) 100.6 F (38.1 C)  98.7 F (37.1 C)  TempSrc:    Oral  SpO2: 94% 93%  95%  Weight:      Height:        Intake/Output Summary (Last 24 hours) at 05/15/2024 0913 Last data filed at 05/15/2024 9394 Gross per 24 hour  Intake 1133.33 ml  Output --  Net 1133.33 ml   Filed Weights   05/14/24 2339  Weight: 91.8 kg    Examination:  General exam: Appears calm and comfortable  Respiratory system: Clear to auscultation. Respiratory effort normal. Cardiovascular system: S1 & S2 heard, RRR. No JVD, murmurs, rubs, gallops or clicks. No pedal edema. Gastrointestinal system: Abdomen is nondistended, soft and nontender. No organomegaly or masses felt. Normal bowel sounds heard. Central nervous system: Alert and oriented. No focal neurological deficits. Extremities: Symmetric 5 x 5 power. Skin: No rashes, lesions or ulcers Psychiatry: Judgement and insight appear normal. Mood & affect appropriate.     Data Reviewed: I have personally reviewed following labs and imaging studies  CBC: Recent Labs  Lab 05/14/24 1924 05/15/24 0444  WBC 10.0 11.3*  NEUTROABS 8.9*  --   HGB 11.0* 9.5*  HCT 35.7* 31.2*  MCV 85.0  84.1  PLT 261 254   Basic Metabolic Panel: Recent Labs  Lab 05/14/24 1924 05/15/24 0444  NA 136 137  K 4.1 4.0  CL 106 105  CO2 21* 22  GLUCOSE 160* 129*  BUN 19 19  CREATININE 0.91 0.97  CALCIUM  8.9 8.3*   GFR: Estimated Creatinine Clearance: 54.1 mL/min (by C-G formula based on SCr of 0.97 mg/dL). Liver Function Tests: Recent Labs  Lab 05/14/24 1924  AST 25  ALT 11  ALKPHOS 55  BILITOT 1.0  PROT 7.3  ALBUMIN 3.1*   No results for input(s):  LIPASE, AMYLASE in the last 168 hours. No results for input(s): AMMONIA in the last 168 hours. Coagulation Profile: No results for input(s): INR, PROTIME in the last 168 hours. Cardiac Enzymes: No results for input(s): CKTOTAL, CKMB, CKMBINDEX, TROPONINI in the last 168 hours. BNP (last 3 results) No results for input(s): PROBNP in the last 8760 hours. HbA1C: No results for input(s): HGBA1C in the last 72 hours. CBG: No results for input(s): GLUCAP in the last 168 hours. Lipid Profile: No results for input(s): CHOL, HDL, LDLCALC, TRIG, CHOLHDL, LDLDIRECT in the last 72 hours. Thyroid Function Tests: No results for input(s): TSH, T4TOTAL, FREET4, T3FREE, THYROIDAB in the last 72 hours. Anemia Panel: No results for input(s): VITAMINB12, FOLATE, FERRITIN, TIBC, IRON , RETICCTPCT in the last 72 hours. Sepsis Labs: Recent Labs  Lab 05/14/24 1924  LATICACIDVEN 1.0    Recent Results (from the past 240 hours)  Blood Culture (routine x 2)     Status: None (Preliminary result)   Collection Time: 05/14/24  7:24 PM   Specimen: BLOOD  Result Value Ref Range Status   Specimen Description BLOOD BLOOD LEFT ARM  Final   Special Requests   Final    BOTTLES DRAWN AEROBIC AND ANAEROBIC Blood Culture results may not be optimal due to an inadequate volume of blood received in culture bottles   Culture   Final    NO GROWTH < 12 HOURS Performed at [facility], [address] [place], [place] [address]    Report Status PENDING  Incomplete  Blood Culture (routine x 2)     Status: None (Preliminary result)   Collection Time: 05/14/24  7:24 PM   Specimen: BLOOD  Result Value Ref Range Status   Specimen Description BLOOD BLOOD RIGHT HAND  Final   Special Requests   Final    BOTTLES DRAWN AEROBIC AND ANAEROBIC Blood Culture adequate volume   Culture   Final    NO GROWTH < 12 HOURS Performed at [facility], [address], [place], [place] [address]    Report Status PENDING  Incomplete  Resp panel by RT-PCR (RSV, Flu A&B, Covid) Anterior Nasal Swab     Status: None   Collection Time: 05/14/24  7:47 PM   Specimen: Anterior Nasal Swab  Result Value Ref Range Status   SARS Coronavirus 2 by RT PCR NEGATIVE NEGATIVE Final    Comment: (NOTE) SARS-CoV-2 target nucleic acids are NOT DETECTED.  The SARS-CoV-2 RNA is generally detectable in upper respiratory specimens during the acute phase of infection. The lowest concentration of SARS-CoV-2 viral copies this assay can detect is 138 copies/mL. A negative result does not preclude SARS-Cov-2 infection and should not be used as the sole basis for treatment or other patient management decisions. A negative result may occur with  improper specimen collection/handling, submission of specimen other than nasopharyngeal swab, presence of viral mutation(s) within the areas targeted  by this assay, and inadequate number of viral copies(<138 copies/mL). A negative result must be combined with clinical observations, patient history, and epidemiological information. The expected result is Negative.  Fact Sheet for Patients:  bloggercourse.com  Fact Sheet for Healthcare Providers:  seriousbroker.it  This test is no t yet approved or cleared by the [place]  FDA and  has been authorized for detection and/or diagnosis of SARS-CoV-2 by FDA under an Emergency Use Authorization (EUA). This EUA will remain  in effect (meaning this test can be used) for the duration of the COVID-19 declaration under Section 564(b)(1) of the Act, 21 U.S.C.section 360bbb-3(b)(1), unless the authorization is terminated  or revoked sooner.       Influenza A by PCR NEGATIVE NEGATIVE Final   Influenza B by PCR NEGATIVE NEGATIVE Final    Comment: (NOTE) The Xpert Xpress SARS-CoV-2/FLU/RSV plus assay is intended as an aid in the diagnosis of  influenza from Nasopharyngeal swab specimens and should not be used as a sole basis for treatment. Nasal washings and aspirates are unacceptable for Xpert Xpress SARS-CoV-2/FLU/RSV testing.  Fact Sheet for Patients: bloggercourse.com  Fact Sheet for Healthcare Providers: seriousbroker.it  This test is not yet approved or cleared by the [place]  FDA and has been authorized for detection and/or diagnosis of SARS-CoV-2 by FDA under an Emergency Use Authorization (EUA). This EUA will remain in effect (meaning this test can be used) for the duration of the COVID-19 declaration under Section 564(b)(1) of the Act, 21 U.S.C. section 360bbb-3(b)(1), unless the authorization is terminated or revoked.     Resp Syncytial Virus by PCR NEGATIVE NEGATIVE Final    Comment: (NOTE) Fact Sheet for Patients: bloggercourse.com  Fact Sheet for Healthcare Providers: seriousbroker.it  This test is not yet approved or cleared by the [place]  FDA and has been authorized for detection and/or diagnosis of SARS-CoV-2 by FDA under an Emergency Use Authorization (EUA). This EUA will remain in effect (meaning this test can be used) for the duration of the COVID-19 declaration under Section 564(b)(1) of the Act, 21 U.S.C. section 360bbb-3(b)(1), unless the authorization is terminated or revoked.  Performed at [name], [address] [place], [place] [address]          Radiology Studies: CT ABDOMEN PELVIS W CONTRAST Result Date: 05/14/2024 EXAM: CT ABDOMEN AND PELVIS WITH CONTRAST 05/14/2024 08:56:58 PM TECHNIQUE: CT of the abdomen and pelvis was performed with the administration of 100 mL of iohexol  (OMNIPAQUE ) 300 MG/ML solution. Multiplanar reformatted images are provided for review. Automated exposure control, iterative reconstruction, and/or weight-based adjustment of the  mA/kV was utilized to reduce the radiation dose to as low as reasonably achievable. COMPARISON: Comparison study 04/06/2024. CLINICAL HISTORY: Possible sepsis. FINDINGS: LOWER CHEST: Lung bases demonstrate some mild scarring. LIVER: The liver is within normal limits. GALLBLADDER AND BILE DUCTS: Gallbladder is within normal limits. No biliary ductal dilatation. SPLEEN: The spleen is within normal limits. PANCREAS: The pancreas is unremarkable. ADRENAL GLANDS: No acute abnormality. KIDNEYS, URETERS AND BLADDER: Considerable perinephric stranding is noted on the left, new from the prior exam. A nephroureteral catheter is noted in satisfactory position. Left-sided hydronephrosis is noted with a decompressed urinary bladder, raising suspicion for catheter malfunction or kinking. A right renal calculus is noted measuring approximately 6 mm. No obstructive changes are seen on the right. The urinary bladder is decompressed. GI AND BOWEL: Stomach and small bowel are within normal limits. The colon shows scattered fecal material throughout. No obstructive changes are seen.  No inflammatory changes are noted. The appendix is not well visualized and may have been surgically removed. PERITONEUM AND RETROPERITONEUM: No free fluid or free air is identified. VASCULATURE: Aortic calcifications are seen without an aneurysmal dilatation. IVC filter is seen in place. LYMPH NODES: No lymphadenopathy. REPRODUCTIVE ORGANS: Uterus is within normal limits. No adnexal mass is seen. BONES AND SOFT TISSUES: Degenerative changes of the hip joints are noted bilaterally. Lumbar spine degenerative changes are noted as well. IMPRESSION: 1. Left-sided hydronephrosis with considerable perinephric stranding, new from the prior exam, with nephroureteral catheter in satisfactory position; the decompressed urinary bladder raises suspicion for catheter malfunction or kinking. 2. Right renal calculus measuring approximately 6 mm without obstructive changes.  Electronically signed by: Oneil Devonshire MD 05/14/2024 09:23 PM EST RP Workstation: [id number]   DG Chest Port 1 View Result Date: 05/14/2024 EXAM: 1 VIEW(S) XRAY OF THE CHEST 05/14/2024 07:32:00 PM COMPARISON: 04/16/2024 CLINICAL HISTORY: Questionable sepsis - evaluate for abnormality FINDINGS: LUNGS AND PLEURA: Low lung volumes. No focal pulmonary opacity. No pulmonary edema. No pleural effusion. No pneumothorax. HEART AND MEDIASTINUM: Aortic atherosclerosis. No acute abnormality of the cardiac and mediastinal silhouettes. BONES AND SOFT TISSUES: No acute osseous abnormality. IMPRESSION: 1. No acute findings. Electronically signed by: Norman Gatlin MD 05/14/2024 08:02 PM EST RP Workstation: [id number]        Scheduled Meds:  enoxaparin   30 mg Subcutaneous Q12H   midodrine   5 mg Oral TID WC   pantoprazole   40 mg Oral BID   rosuvastatin   10 mg Oral Daily   Continuous Infusions:  lactated ringers      meropenem  (MERREM ) IV 1 g (05/15/24 9391)     LOS: 0 days       Anthony CHRISTELLA Pouch, MD [facility] Pager [phone]  If 7PM-7AM, please contact night-coverage [email or web address] 05/15/2024, 9:13 AM

## 2024-05-15 NOTE — Plan of Care (Signed)
  Problem: Education: Goal: Knowledge of General Education information will improve Description: Including pain rating scale, medication(s)/side effects and non-pharmacologic comfort measures Outcome: Progressing   Problem: Health Behavior/Discharge Planning: Goal: Ability to manage health-related needs will improve Outcome: Progressing   Problem: Clinical Measurements: Goal: Ability to maintain clinical measurements within normal limits will improve Outcome: Progressing Goal: Will remain free from infection Outcome: Progressing Goal: Diagnostic test results will improve Outcome: Progressing Goal: Respiratory complications will improve Outcome: Progressing Goal: Cardiovascular complication will be avoided Outcome: Progressing   Problem: Activity: Goal: Risk for activity intolerance will decrease Outcome: Progressing   Problem: Nutrition: Goal: Adequate nutrition will be maintained Outcome: Progressing   Problem: Pain Managment: Goal: General experience of comfort will improve and/or be controlled Outcome: Progressing   Problem: Fluid Volume: Goal: Hemodynamic stability will improve Outcome: Progressing   Problem: Clinical Measurements: Goal: Diagnostic test results will improve Outcome: Progressing Goal: Signs and symptoms of infection will decrease Outcome: Progressing

## 2024-05-15 NOTE — Assessment & Plan Note (Signed)
 Will hold blood pressure meds tonight in the setting of sepsis diagnosis

## 2024-05-15 NOTE — Progress Notes (Signed)
 Brief Interventional Radiology Note:  68 year old female with a complicated medical history including chronic left hydronephrosis. She is known to IR from previous left PCN placement in 2023 and routine exchanges. On 2/21 she underwent conversion from left PCN to left nephroureteral catheter. Patient's last exchange was on 8/29 and patient has unfortunately not presented for her most recent scheduled exchanges. States that she does not receive our phone calls to schedule her appointment.   Currently admitted with sepsis secondary to complicated UTI and concern for possible blocked nephroureteral tube. IR consulted for possible left nephroureteral catheter exchange. Patient is agreeable to undergo procedure. Discussed with patient this would not require sedation. Patient verbalized understanding and is okay to proceed. Consent obtained and in IR suite.  Please reach out to IR with any additional questions or concerns.   Electronically Signed: [place] Devin M Cassie Henkels, PA-C 05/15/2024, 1:04 PM

## 2024-05-16 DIAGNOSIS — N10 Acute tubulo-interstitial nephritis: Secondary | ICD-10-CM | POA: Diagnosis not present

## 2024-05-16 LAB — CBC
HCT: 28.1 % — ABNORMAL LOW (ref 36.0–46.0)
Hemoglobin: 8.3 g/dL — ABNORMAL LOW (ref 12.0–15.0)
MCH: 26.2 pg (ref 26.0–34.0)
MCHC: 29.5 g/dL — ABNORMAL LOW (ref 30.0–36.0)
MCV: 88.6 fL (ref 80.0–100.0)
Platelets: 209 K/uL (ref 150–400)
RBC: 3.17 MIL/uL — ABNORMAL LOW (ref 3.87–5.11)
RDW: 18.6 % — ABNORMAL HIGH (ref 11.5–15.5)
WBC: 5.8 K/uL (ref 4.0–10.5)
nRBC: 0 % (ref 0.0–0.2)

## 2024-05-16 LAB — BASIC METABOLIC PANEL WITH GFR
Anion gap: 8 (ref 5–15)
BUN: 19 mg/dL (ref 8–23)
CO2: 22 mmol/L (ref 22–32)
Calcium: 7.9 mg/dL — ABNORMAL LOW (ref 8.9–10.3)
Chloride: 108 mmol/L (ref 98–111)
Creatinine, Ser: 1.09 mg/dL — ABNORMAL HIGH (ref 0.44–1.00)
GFR, Estimated: 55 mL/min — ABNORMAL LOW (ref >60)
Glucose, Bld: 92 mg/dL (ref 70–99)
Potassium: 3.6 mmol/L (ref 3.5–5.1)
Sodium: 138 mmol/L (ref 135–145)

## 2024-05-16 MED ORDER — MIDODRINE HCL 5 MG PO TABS
5.0000 mg | ORAL_TABLET | Freq: Once | ORAL | Status: AC
  Administered 2024-05-16: 5 mg via ORAL
  Filled 2024-05-16: qty 1

## 2024-05-16 MED ORDER — VANCOMYCIN HCL 750 MG/150ML IV SOLN
750.0000 mg | INTRAVENOUS | Status: DC
  Administered 2024-05-16 – 2024-05-17 (×2): 750 mg via INTRAVENOUS
  Filled 2024-05-16 (×3): qty 150

## 2024-05-16 MED ORDER — MIDODRINE HCL 5 MG PO TABS
10.0000 mg | ORAL_TABLET | Freq: Three times a day (TID) | ORAL | Status: DC
  Administered 2024-05-17 – 2024-05-20 (×10): 10 mg via ORAL
  Filled 2024-05-16 (×10): qty 2

## 2024-05-16 NOTE — Consult Note (Signed)
 Pharmacy Antibiotic Note  Courtney Mack is a 68 y.o. female w/ PMH of nephrostomy secondary to chronic left-sided hydronephrosis, frequent UTIs, HTN, COPD, morbid obesity, PE/DVT s/p IVC filter, endometrial cancer, history of GI bleed, HFpEF  admitted on 05/14/2024 with bacteremia and UTI.  Pharmacy has been consulted for vancomycin  dosing.   Plan: adjust vancomycin  to 750 mg IV every 24 hours Goal AUC 400-550. Expected AUC: 509.9 Expected Cmin: 14.6 SCr used: 1.09 mg/dL  Height: 4' 11 (850.0 cm) Weight: 91.8 kg (202 lb 6.1 oz) IBW/kg (Calculated) : 43.2  Temp (24hrs), Avg:99.6 F (37.6 C), Min:98.8 F (37.1 C), Max:100.6 F (38.1 C)  Recent Labs  Lab 05/14/24 1924 05/15/24 0444 05/16/24 0613  WBC 10.0 11.3* 5.8  CREATININE 0.91 0.97 1.09*  LATICACIDVEN 1.0  --   --     Estimated Creatinine Clearance: 48.1 mL/min (A) (by C-G formula based on SCr of 1.09 mg/dL (H)).    No Known Allergies  Antimicrobials this admission: meropenem  11/7 >>  vancomycin  11/8 >>   Microbiology results: 11/9 BCx: NGTD 11/8 UCx: pending 11/7 BCx: 1 of 4 bottles, Staph epi (resistance), provider will repeat cultures as well.  Thank you for allowing pharmacy to be a part of this patient's care.  Raquel Rodriguez-Guzman PharmD, BCPS 05/16/2024 12:26 PM

## 2024-05-16 NOTE — Plan of Care (Signed)
  Problem: Education: ?Goal: Knowledge of General Education information will improve ?Description: Including pain rating scale, medication(s)/side effects and non-pharmacologic comfort measures ?Outcome: Progressing ?  ?Problem: Health Behavior/Discharge Planning: ?Goal: Ability to manage health-related needs will improve ?Outcome: Progressing ?  ?Problem: Clinical Measurements: ?Goal: Will remain free from infection ?Outcome: Progressing ?Goal: Diagnostic test results will improve ?Outcome: Progressing ?Goal: Cardiovascular complication will be avoided ?Outcome: Progressing ?  ?Problem: Activity: ?Goal: Risk for activity intolerance will decrease ?Outcome: Progressing ?  ?

## 2024-05-16 NOTE — Care Management Obs Status (Signed)
 MEDICARE OBSERVATION STATUS NOTIFICATION   Patient Details  Name: Courtney Mack MRN: [id number] Date of Birth: [birth year]   Medicare Observation Status Notification Given:  Yes    Rojelio SHAUNNA Rattler 05/16/2024, 6:30 PM

## 2024-05-16 NOTE — Progress Notes (Signed)
 PROGRESS NOTE   HPI: Courtney Mack is a 68 y.o. female with medical history significant for nephrostomy secondary to chronic left-sided hydronephrosis, frequent UTIs requiring hospitalization most recently for ESBL E. coli 04/06/2024, as well as history of HTN, COPD, morbid obesity, PE/DVT s/p IVC filter on Lovenox  shots, endometrial cancer, history of GI bleed, HFpEF,  being admitted with a sepsis secondary to complicated UTI and concerns for a possibly blocked nephroureteral tube.  She presented with a fever of 101.2 and flank pain.  Patient has been on prophylactic trimethoprim  since February 2025. In the ED, temp 100.3 with heart rate of 100, mild tachypnea to 22, BP 131/45 O2 sat 94% on room air. Labs notable for WBC of 10 and lactic acid 1.0, hemoglobin 11.  CMP for the most part unremarkable, troponin 8, respiratory viral panel negative for COVID flu and RSV EKG showed sinus at 89 CT abdomen and pelvis with contrast showed nephroureteral catheter in satisfactory position, left-sided hydronephrosis but with decompressed bladder raising suspicion for catheter malfunction or kinking, new perinephric stranding.  Nonobstructive right renal calculus   The ED provider spoke with urologist, Dr. Carolee who recommended uncapping the nephrostomy tube and placing a bag to allow drainage in case of blockage of the nephroureteral catheter as well as IR consult for exchange   Patient started on meropenem  due to recent ESBL   Admission requested    Courtney Mack  FMW:[id number] DOB: [birth year] DOA: 05/14/2024 [facility]CP: [facility]   Assessment & Plan:   Active Problems:   Acute pyelonephritis   History of infection due to ESBL Escherichia coli   Hydronephrosis of left kidney   COPD (chronic obstructive pulmonary disease) (HCC)   Blocked nephrostomy tube with hydronephrosis and hydroureter (HCC)   Complicated UTI (urinary tract infection)   Chronic diastolic CHF  (congestive heart failure) (HCC)   HTN (hypertension)   Endometrial cancer (HCC)   History of pulmonary embolism/DVT s/p IVC filter   Obesity, Class III, BMI 40-49.9 (morbid obesity) (HCC)  Assessment and Plan: Acute pyelonephritis: continue on IV merrem  secondary to recent hx of ESBL. Urine cx shows insignificant growth, likely b/c urine cx taken after abxs were started. Uro following and recs apprec  Sepsis: met criteria w/ fever, leukocytosis, tachypnea & acute pyelonephritis. Continue on IV abxs. Still spiking fevers   Hx of left nephrostomy/nephroureteral catheter: nephroureteral catheter in satisfactory position as per CT. Will have possibly left nephroureteral catheter exchange tomorrow as per IR tomorrow   Possible bacteremia: blood cx growing staph epi. Started and will continue on IV vanco. Repeat blood cxs ordered today   COPD: w/o exacerbation. Bronchodilators prn    Chronic diastolic CHF: appears compensated. Monitor I/Os   HTN: continue to hold all anti-HTN meds secondary to above infection  HLD: continue on statin    Endometrial cancer: management per onco outpatient    Hx of pulmonary embolism/DVT: s/p IVC filter.    Morbid obesity: BMI 40.8. Complicates overall care & prognosis        DVT prophylaxis: lovenox   Code Status: full  Family Communication: discussed pt's care w/ pt's daughter, Courtney Mack, and answered her questions  Disposition Plan: depends on PT/OT recs   Level of care: Med-Surg  Status is: Observation The patient remains OBS appropriate and will d/c before 2 midnights.    Consultants:  Uro IR  Procedures:   Antimicrobials: merrem , vanco    Subjective: Pt c/o fatigue   Objective: Vitals:  05/15/24 2003 05/16/24 0116 05/16/24 0417 05/16/24 0817  BP: (!) 98/37 (!) 104/45 (!) 104/39 (!) 125/49  Pulse: 72 76 74 73  Resp: (!) 32  (!) 24 20  Temp: 98.8 F (37.1 C)  98.8 F (37.1 C) 98.9 F (37.2 C)  TempSrc: Oral  Oral Oral   SpO2: 95%  93% 94%  Weight:      Height:        Intake/Output Summary (Last 24 hours) at 05/16/2024 0923 Last data filed at 05/16/2024 0622 Gross per 24 hour  Intake 480 ml  Output 1050 ml  Net -570 ml   Filed Weights   05/14/24 2339  Weight: 91.8 kg    Examination:  General exam: Appears calm and comfortable  Respiratory system: Clear to auscultation. Respiratory effort normal. Cardiovascular system: S1 & S2 +. No rubs, gallops or clicks. Gastrointestinal system: Abdomen is obese, soft and nontender. Normal bowel sounds heard. Central nervous system: Alert and oriented. Moves all extremities Psychiatry: Judgement and insight appears at baseline. Flat mood and affect    Data Reviewed: I have personally reviewed following labs and imaging studies  CBC: Recent Labs  Lab 05/14/24 1924 05/15/24 0444 05/16/24 0613  WBC 10.0 11.3* 5.8  NEUTROABS 8.9*  --   --   HGB 11.0* 9.5* 8.3*  HCT 35.7* 31.2* 28.1*  MCV 85.0 84.1 88.6  PLT 261 254 209   Basic Metabolic Panel: Recent Labs  Lab 05/14/24 1924 05/15/24 0444 05/16/24 0613  NA 136 137 138  K 4.1 4.0 3.6  CL 106 105 108  CO2 21* 22 22  GLUCOSE 160* 129* 92  BUN 19 19 19   CREATININE 0.91 0.97 1.09*  CALCIUM  8.9 8.3* 7.9*   GFR: Estimated Creatinine Clearance: 48.1 mL/min (A) (by C-G formula based on SCr of 1.09 mg/dL (H)). Liver Function Tests: Recent Labs  Lab 05/14/24 1924  AST 25  ALT 11  ALKPHOS 55  BILITOT 1.0  PROT 7.3  ALBUMIN 3.1*   No results for input(s): LIPASE, AMYLASE in the last 168 hours. No results for input(s): AMMONIA in the last 168 hours. Coagulation Profile: No results for input(s): INR, PROTIME in the last 168 hours. Cardiac Enzymes: No results for input(s): CKTOTAL, CKMB, CKMBINDEX, TROPONINI in the last 168 hours. BNP (last 3 results) No results for input(s): PROBNP in the last 8760 hours. HbA1C: No results for input(s): HGBA1C in the last 72  hours. CBG: No results for input(s): GLUCAP in the last 168 hours. Lipid Profile: No results for input(s): CHOL, HDL, LDLCALC, TRIG, CHOLHDL, LDLDIRECT in the last 72 hours. Thyroid Function Tests: No results for input(s): TSH, T4TOTAL, FREET4, T3FREE, THYROIDAB in the last 72 hours. Anemia Panel: No results for input(s): VITAMINB12, FOLATE, FERRITIN, TIBC, IRON , RETICCTPCT in the last 72 hours. Sepsis Labs: Recent Labs  Lab 05/14/24 1924  LATICACIDVEN 1.0    Recent Results (from the past 240 hours)  Blood Culture (routine x 2)     Status: None (Preliminary result)   Collection Time: 05/14/24  7:24 PM   Specimen: BLOOD  Result Value Ref Range Status   Specimen Description BLOOD BLOOD LEFT ARM  Final   Special Requests   Final    BOTTLES DRAWN AEROBIC AND ANAEROBIC Blood Culture results may not be optimal due to an inadequate volume of blood received in culture bottles   Culture   Final    NO GROWTH 2 DAYS Performed at [facility], [address] [place], [place]  [address]    Report Status PENDING  Incomplete  Blood Culture (routine x 2)     Status: None (Preliminary result)   Collection Time: 05/14/24  7:24 PM   Specimen: BLOOD RIGHT HAND  Result Value Ref Range Status   Specimen Description   Final    BLOOD RIGHT HAND Performed at [facility], [address] [place], [place] [address]    Special Requests   Final    BOTTLES DRAWN AEROBIC AND ANAEROBIC Blood Culture adequate volume Performed at [facility], [address] [place], [place] [address]    Culture  Setup Time   Final    GRAM POSITIVE COCCI IN BOTH AEROBIC AND ANAEROBIC BOTTLES CRITICAL RESULT CALLED TO, READ BACK BY AND VERIFIED WITH:  RAQUEL RODRIGUEZ GUZMAN, PHARM D AT 1703 05/15/24 RAM Performed at [facility], [address] [place], [place] [address]    Culture GRAM POSITIVE COCCI  Final   Report Status PENDING  Incomplete   Blood Culture ID Panel (Reflexed)     Status: Abnormal   Collection Time: 05/14/24  7:24 PM  Result Value Ref Range Status   Enterococcus faecalis NOT DETECTED NOT DETECTED Final   Enterococcus Faecium NOT DETECTED NOT DETECTED Final   Listeria monocytogenes NOT DETECTED NOT DETECTED Final   Staphylococcus species DETECTED (A) NOT DETECTED Final    Comment: CRITICAL RESULT CALLED TO, READ BACK BY AND VERIFIED WITH: RAQUEL RODRIGUEZ GUZMAN, PHARM D AT 1703 05/15/24 RAM    Staphylococcus aureus (BCID) NOT DETECTED NOT DETECTED Final   Staphylococcus epidermidis DETECTED (A) NOT DETECTED Final    Comment: Methicillin (oxacillin) resistant coagulase negative staphylococcus. Possible blood culture contaminant (unless isolated from more than one blood culture draw or clinical case suggests pathogenicity). No antibiotic treatment is indicated for blood  culture contaminants. RAQUEL RODRIGUEZ GUZMAN, PHARM D AT 1703 05/15/24 RAM    Staphylococcus lugdunensis NOT DETECTED NOT DETECTED Final   Streptococcus species NOT DETECTED NOT DETECTED Final   Streptococcus agalactiae NOT DETECTED NOT DETECTED Final   Streptococcus pneumoniae NOT DETECTED NOT DETECTED Final   Streptococcus pyogenes NOT DETECTED NOT DETECTED Final   A.calcoaceticus-baumannii NOT DETECTED NOT DETECTED Final   Bacteroides fragilis NOT DETECTED NOT DETECTED Final   Enterobacterales NOT DETECTED NOT DETECTED Final   Enterobacter cloacae complex NOT DETECTED NOT DETECTED Final   Escherichia coli NOT DETECTED NOT DETECTED Final   Klebsiella aerogenes NOT DETECTED NOT DETECTED Final   Klebsiella oxytoca NOT DETECTED NOT DETECTED Final   Klebsiella pneumoniae NOT DETECTED NOT DETECTED Final   Proteus species NOT DETECTED NOT DETECTED Final   Salmonella species NOT DETECTED NOT DETECTED Final   Serratia marcescens NOT DETECTED NOT DETECTED Final   Haemophilus influenzae NOT DETECTED NOT DETECTED Final   Neisseria meningitidis NOT  DETECTED NOT DETECTED Final   Pseudomonas aeruginosa NOT DETECTED NOT DETECTED Final   Stenotrophomonas maltophilia NOT DETECTED NOT DETECTED Final   Candida albicans NOT DETECTED NOT DETECTED Final   Candida auris NOT DETECTED NOT DETECTED Final   Candida glabrata NOT DETECTED NOT DETECTED Final   Candida krusei NOT DETECTED NOT DETECTED Final   Candida parapsilosis NOT DETECTED NOT DETECTED Final   Candida tropicalis NOT DETECTED NOT DETECTED Final   Cryptococcus neoformans/gattii NOT DETECTED NOT DETECTED Final   Methicillin resistance mecA/C DETECTED (A) NOT DETECTED Final    CommentBETHA LILIA EVERN ELIZA Mack D AT 1703 05/15/24 RAM Performed at [facility], [address] [place], [place] [address]   Resp panel by RT-PCR (RSV, Flu A&B, Covid) Anterior Nasal Swab     Status: None   Collection Time: 05/14/24  7:47 PM   Specimen: Anterior Nasal Swab  Result Value Ref Range Status   SARS Coronavirus 2 by RT PCR NEGATIVE NEGATIVE Final    Comment: (NOTE) SARS-CoV-2 target nucleic acids are NOT DETECTED.  The SARS-CoV-2 RNA is generally detectable in upper respiratory specimens during the acute phase of infection. The lowest concentration of SARS-CoV-2 viral copies this assay can detect is 138 copies/mL. A negative result does not preclude SARS-Cov-2 infection and should not be used as the sole basis for treatment or other patient management decisions. A negative result may occur with  improper specimen collection/handling, submission of specimen other than nasopharyngeal swab, presence of viral mutation(s) within the areas targeted by this assay, and inadequate number of viral copies(<138 copies/mL). A negative result must be combined with clinical observations, patient history, and epidemiological information. The expected result is Negative.  Fact Sheet for Patients:  bloggercourse.com  Fact Sheet for Healthcare Providers:   seriousbroker.it  This test is no t yet approved or cleared by the [place]  FDA and  has been authorized for detection and/or diagnosis of SARS-CoV-2 by FDA under an Emergency Use Authorization (EUA). This EUA will remain  in effect (meaning this test can be used) for the duration of the COVID-19 declaration under Section 564(b)(1) of the Act, 21 U.S.C.section 360bbb-3(b)(1), unless the authorization is terminated  or revoked sooner.       Influenza A by PCR NEGATIVE NEGATIVE Final   Influenza B by PCR NEGATIVE NEGATIVE Final    Comment: (NOTE) The Xpert Xpress SARS-CoV-2/FLU/RSV plus assay is intended as an aid in the diagnosis of influenza from Nasopharyngeal swab specimens and should not be used as a sole basis for treatment. Nasal washings and aspirates are unacceptable for Xpert Xpress SARS-CoV-2/FLU/RSV testing.  Fact Sheet for Patients: bloggercourse.com  Fact Sheet for Healthcare Providers: seriousbroker.it  This test is not yet approved or cleared by the [place]  FDA and has been authorized for detection and/or diagnosis of SARS-CoV-2 by FDA under an Emergency Use Authorization (EUA). This EUA will remain in effect (meaning this test can be used) for the duration of the COVID-19 declaration under Section 564(b)(1) of the Act, 21 U.S.C. section 360bbb-3(b)(1), unless the authorization is terminated or revoked.     Resp Syncytial Virus by PCR NEGATIVE NEGATIVE Final    Comment: (NOTE) Fact Sheet for Patients: bloggercourse.com  Fact Sheet for Healthcare Providers: seriousbroker.it  This test is not yet approved or cleared by the [place]  FDA and has been authorized for detection and/or diagnosis of SARS-CoV-2 by FDA under an Emergency Use Authorization (EUA). This EUA will remain in effect (meaning this test can be used) for  the duration of the COVID-19 declaration under Section 564(b)(1) of the Act, 21 U.S.C. section 360bbb-3(b)(1), unless the authorization is terminated or revoked.  Performed at [facility], [address], [place], [place] [address]          Radiology Studies: CT ABDOMEN PELVIS W CONTRAST Result Date: 05/14/2024 EXAM: CT ABDOMEN AND PELVIS WITH CONTRAST 05/14/2024 08:56:58 PM TECHNIQUE: CT of the abdomen and pelvis was performed with the administration of 100 mL of iohexol  (OMNIPAQUE ) 300 MG/ML solution. Multiplanar reformatted images are provided for review. Automated exposure control, iterative reconstruction, and/or weight-based adjustment of the mA/kV was utilized to reduce the radiation dose to  as low as reasonably achievable. COMPARISON: Comparison study 04/06/2024. CLINICAL HISTORY: Possible sepsis. FINDINGS: LOWER CHEST: Lung bases demonstrate some mild scarring. LIVER: The liver is within normal limits. GALLBLADDER AND BILE DUCTS: Gallbladder is within normal limits. No biliary ductal dilatation. SPLEEN: The spleen is within normal limits. PANCREAS: The pancreas is unremarkable. ADRENAL GLANDS: No acute abnormality. KIDNEYS, URETERS AND BLADDER: Considerable perinephric stranding is noted on the left, new from the prior exam. A nephroureteral catheter is noted in satisfactory position. Left-sided hydronephrosis is noted with a decompressed urinary bladder, raising suspicion for catheter malfunction or kinking. A right renal calculus is noted measuring approximately 6 mm. No obstructive changes are seen on the right. The urinary bladder is decompressed. GI AND BOWEL: Stomach and small bowel are within normal limits. The colon shows scattered fecal material throughout. No obstructive changes are seen. No inflammatory changes are noted. The appendix is not well visualized and may have been surgically removed. PERITONEUM AND RETROPERITONEUM: No free fluid or free air is identified.  VASCULATURE: Aortic calcifications are seen without an aneurysmal dilatation. IVC filter is seen in place. LYMPH NODES: No lymphadenopathy. REPRODUCTIVE ORGANS: Uterus is within normal limits. No adnexal mass is seen. BONES AND SOFT TISSUES: Degenerative changes of the hip joints are noted bilaterally. Lumbar spine degenerative changes are noted as well. IMPRESSION: 1. Left-sided hydronephrosis with considerable perinephric stranding, new from the prior exam, with nephroureteral catheter in satisfactory position; the decompressed urinary bladder raises suspicion for catheter malfunction or kinking. 2. Right renal calculus measuring approximately 6 mm without obstructive changes. Electronically signed by: Oneil Devonshire MD 05/14/2024 09:23 PM EST RP Workstation: [id number]   DG Chest Port 1 View Result Date: 05/14/2024 EXAM: 1 VIEW(S) XRAY OF THE CHEST 05/14/2024 07:32:00 PM COMPARISON: 04/16/2024 CLINICAL HISTORY: Questionable sepsis - evaluate for abnormality FINDINGS: LUNGS AND PLEURA: Low lung volumes. No focal pulmonary opacity. No pulmonary edema. No pleural effusion. No pneumothorax. HEART AND MEDIASTINUM: Aortic atherosclerosis. No acute abnormality of the cardiac and mediastinal silhouettes. BONES AND SOFT TISSUES: No acute osseous abnormality. IMPRESSION: 1. No acute findings. Electronically signed by: Norman Gatlin MD 05/14/2024 08:02 PM EST RP Workstation: [id number]        Scheduled Meds:  enoxaparin   30 mg Subcutaneous Q12H   midodrine   5 mg Oral TID WC   pantoprazole   40 mg Oral BID   rosuvastatin   10 mg Oral Daily   Continuous Infusions:  lactated ringers  75 mL/hr at 05/16/24 0309   meropenem  (MERREM ) IV 1 g (05/16/24 0556)   [START ON 05/17/2024] vancomycin        LOS: 0 days       Anthony CHRISTELLA Pouch, MD [facility] Pager [phone]  If 7PM-7AM, please contact night-coverage [email or web address] 05/16/2024, 9:23 AM

## 2024-05-16 NOTE — Plan of Care (Signed)
°  Problem: Health Behavior/Discharge Planning: °Goal: Ability to manage health-related needs will improve °Outcome: Progressing °  °Problem: Clinical Measurements: °Goal: Ability to maintain clinical measurements within normal limits will improve °Outcome: Progressing °Goal: Diagnostic test results will improve °Outcome: Progressing °  °Problem: Activity: °Goal: Risk for activity intolerance will decrease °Outcome: Progressing °  °Problem: Nutrition: °Goal: Adequate nutrition will be maintained °Outcome: Progressing °  °

## 2024-05-17 ENCOUNTER — Observation Stay: Admitting: Radiology

## 2024-05-17 DIAGNOSIS — E119 Type 2 diabetes mellitus without complications: Secondary | ICD-10-CM | POA: Diagnosis present

## 2024-05-17 DIAGNOSIS — N10 Acute tubulo-interstitial nephritis: Secondary | ICD-10-CM | POA: Diagnosis not present

## 2024-05-17 DIAGNOSIS — Y846 Urinary catheterization as the cause of abnormal reaction of the patient, or of later complication, without mention of misadventure at the time of the procedure: Secondary | ICD-10-CM | POA: Diagnosis present

## 2024-05-17 DIAGNOSIS — E7800 Pure hypercholesterolemia, unspecified: Secondary | ICD-10-CM | POA: Diagnosis present

## 2024-05-17 DIAGNOSIS — B957 Other staphylococcus as the cause of diseases classified elsewhere: Secondary | ICD-10-CM | POA: Diagnosis present

## 2024-05-17 DIAGNOSIS — Z1612 Extended spectrum beta lactamase (ESBL) resistance: Secondary | ICD-10-CM | POA: Diagnosis present

## 2024-05-17 DIAGNOSIS — I5032 Chronic diastolic (congestive) heart failure: Secondary | ICD-10-CM | POA: Diagnosis present

## 2024-05-17 DIAGNOSIS — Z95828 Presence of other vascular implants and grafts: Secondary | ICD-10-CM | POA: Diagnosis not present

## 2024-05-17 DIAGNOSIS — Z86711 Personal history of pulmonary embolism: Secondary | ICD-10-CM | POA: Diagnosis not present

## 2024-05-17 DIAGNOSIS — N99528 Other complication of other external stoma of urinary tract: Secondary | ICD-10-CM | POA: Diagnosis not present

## 2024-05-17 DIAGNOSIS — C541 Malignant neoplasm of endometrium: Secondary | ICD-10-CM | POA: Diagnosis present

## 2024-05-17 DIAGNOSIS — R509 Fever, unspecified: Secondary | ICD-10-CM

## 2024-05-17 DIAGNOSIS — N390 Urinary tract infection, site not specified: Secondary | ICD-10-CM

## 2024-05-17 DIAGNOSIS — E66813 Obesity, class 3: Secondary | ICD-10-CM | POA: Diagnosis present

## 2024-05-17 DIAGNOSIS — Z87440 Personal history of urinary (tract) infections: Secondary | ICD-10-CM | POA: Diagnosis not present

## 2024-05-17 DIAGNOSIS — Z923 Personal history of irradiation: Secondary | ICD-10-CM | POA: Diagnosis not present

## 2024-05-17 DIAGNOSIS — A4151 Sepsis due to Escherichia coli [E. coli]: Secondary | ICD-10-CM | POA: Diagnosis present

## 2024-05-17 DIAGNOSIS — R7881 Bacteremia: Secondary | ICD-10-CM

## 2024-05-17 DIAGNOSIS — Y732 Prosthetic and other implants, materials and accessory gastroenterology and urology devices associated with adverse incidents: Secondary | ICD-10-CM | POA: Diagnosis present

## 2024-05-17 DIAGNOSIS — Z7901 Long term (current) use of anticoagulants: Secondary | ICD-10-CM | POA: Diagnosis not present

## 2024-05-17 DIAGNOSIS — Z8249 Family history of ischemic heart disease and other diseases of the circulatory system: Secondary | ICD-10-CM | POA: Diagnosis not present

## 2024-05-17 DIAGNOSIS — N136 Pyonephrosis: Secondary | ICD-10-CM | POA: Diagnosis present

## 2024-05-17 DIAGNOSIS — Z86718 Personal history of other venous thrombosis and embolism: Secondary | ICD-10-CM | POA: Diagnosis not present

## 2024-05-17 DIAGNOSIS — J449 Chronic obstructive pulmonary disease, unspecified: Secondary | ICD-10-CM | POA: Diagnosis present

## 2024-05-17 DIAGNOSIS — N1330 Unspecified hydronephrosis: Secondary | ICD-10-CM

## 2024-05-17 DIAGNOSIS — Z5941 Food insecurity: Secondary | ICD-10-CM | POA: Diagnosis not present

## 2024-05-17 DIAGNOSIS — X58XXXA Exposure to other specified factors, initial encounter: Secondary | ICD-10-CM | POA: Diagnosis not present

## 2024-05-17 DIAGNOSIS — Z833 Family history of diabetes mellitus: Secondary | ICD-10-CM | POA: Diagnosis not present

## 2024-05-17 DIAGNOSIS — T83512A Infection and inflammatory reaction due to nephrostomy catheter, initial encounter: Secondary | ICD-10-CM | POA: Diagnosis present

## 2024-05-17 DIAGNOSIS — I110 Hypertensive heart disease with heart failure: Secondary | ICD-10-CM | POA: Diagnosis present

## 2024-05-17 DIAGNOSIS — Z1152 Encounter for screening for COVID-19: Secondary | ICD-10-CM | POA: Diagnosis not present

## 2024-05-17 DIAGNOSIS — D649 Anemia, unspecified: Secondary | ICD-10-CM | POA: Diagnosis present

## 2024-05-17 DIAGNOSIS — Z6841 Body Mass Index (BMI) 40.0 and over, adult: Secondary | ICD-10-CM | POA: Diagnosis not present

## 2024-05-17 HISTORY — PX: IR NEPHROSTOMY EXCHANGE LEFT: IMG6069

## 2024-05-17 LAB — BASIC METABOLIC PANEL WITH GFR
Anion gap: 9 (ref 5–15)
BUN: 18 mg/dL (ref 8–23)
CO2: 21 mmol/L — ABNORMAL LOW (ref 22–32)
Calcium: 8.1 mg/dL — ABNORMAL LOW (ref 8.9–10.3)
Chloride: 110 mmol/L (ref 98–111)
Creatinine, Ser: 0.92 mg/dL (ref 0.44–1.00)
GFR, Estimated: >60 mL/min (ref >60)
Glucose, Bld: 93 mg/dL (ref 70–99)
Potassium: 3.9 mmol/L (ref 3.5–5.1)
Sodium: 140 mmol/L (ref 135–145)

## 2024-05-17 LAB — CULTURE, BLOOD (ROUTINE X 2): Special Requests: ADEQUATE

## 2024-05-17 LAB — CBC
HCT: 28.1 % — ABNORMAL LOW (ref 36.0–46.0)
Hemoglobin: 8.4 g/dL — ABNORMAL LOW (ref 12.0–15.0)
MCH: 25.5 pg — ABNORMAL LOW (ref 26.0–34.0)
MCHC: 29.9 g/dL — ABNORMAL LOW (ref 30.0–36.0)
MCV: 85.2 fL (ref 80.0–100.0)
Platelets: 244 K/uL (ref 150–400)
RBC: 3.3 MIL/uL — ABNORMAL LOW (ref 3.87–5.11)
RDW: 18.2 % — ABNORMAL HIGH (ref 11.5–15.5)
WBC: 4.6 K/uL (ref 4.0–10.5)
nRBC: 0 % (ref 0.0–0.2)

## 2024-05-17 MED ORDER — LIDOCAINE HCL 1 % IJ SOLN
INTRAMUSCULAR | Status: AC
  Filled 2024-05-17: qty 20

## 2024-05-17 MED ORDER — IOHEXOL 300 MG/ML  SOLN
10.0000 mL | Freq: Once | INTRAMUSCULAR | Status: AC | PRN
  Administered 2024-05-17: 10 mL

## 2024-05-17 MED ORDER — LIDOCAINE HCL 1 % IJ SOLN
10.0000 mL | Freq: Once | INTRAMUSCULAR | Status: AC
  Administered 2024-05-17: 5 mL via INTRADERMAL
  Filled 2024-05-17: qty 10

## 2024-05-17 MED ORDER — SILVER NITRATE-POT NITRATE 75-25 % EX MISC
CUTANEOUS | Status: AC
  Filled 2024-05-17: qty 10

## 2024-05-17 NOTE — Evaluation (Signed)
 Occupational Therapy Evaluation Patient Details Name: Courtney Mack MRN: [id number] DOB: 1955/07/22 Today's Date: 05/17/2024   History of Present Illness   Courtney Mack is a 68 y.o. female with medical history significant for nephrostomy secondary to chronic left-sided hydronephrosis, frequent UTIs requiring hospitalization most recently for ESBL E. coli 04/06/2024, as well as history of HTN, COPD, morbid obesity, PE/DVT s/p IVC filter on Lovenox  shots, endometrial cancer, history of GI bleed, HFpEF,  being admitted with a sepsis secondary to complicated UTI and concerns for a possibly blocked nephroureteral tube. She presented with a fever of 101.2 and flank pain. Per CT with contrast showed nephroureteral catheter in satisfactory position, left-sided hydronephrosis but with decompressed bladder raising suspicion for catheter malfunction or kinking, new perinephric stranding.     Clinical Impressions Pt was seen for OT evaluation this date. Prior to hospital admission, pt was Mod I for ADLs, sponge bathing at baseline w/ daughter assisting to wash hair as well as assisting with LB dressing tasks. Daughter manages most IADLs such as meals but pt reports managing own meds. Pt reports limited in home mobility with use of rollator at baseline. Pt lives with her daughter in one level home with one step to enter. Pt presents with mild deficits in decreased Ind in self care, balance, functional mobility/transfers, activity tolerance, and safety awareness affecting safe and optimal ADL completion. Pt currently requires CGA ambulation with use of RW, verbal cues for hand placement and technique. Pt verbalized bowel/bladder incontinent at baseline, currently required MODA for standing pericare. Pt completed step-pivot transfer from EOB into recliner with use of RW throughout. Pt would benefit from skilled OT services to address noted impairments and functional limitations (see below for any additional  details) in order to maximize safety and independence while minimizing future risk of falls, injury, and readmission. OT will follow acutely.     If plan is discharge home, recommend the following:   A little help with walking and/or transfers;A lot of help with bathing/dressing/bathroom;Assist for transportation;Help with stairs or ramp for entrance     Functional Status Assessment   Patient has had a recent decline in their functional status and demonstrates the ability to make significant improvements in function in a reasonable and predictable amount of time.     Equipment Recommendations   None recommended by OT              Precautions/Restrictions   Precautions Precautions: Fall Recall of Precautions/Restrictions: Intact Restrictions Weight Bearing Restrictions Per Provider Order: No     Mobility Bed Mobility Overal bed mobility: Needs Assistance Bed Mobility: Supine to Sit     Supine to sit: Mod assist, Used rails, HOB elevated     General bed mobility comments: Verbal cues for hand placement and sequencing, increased time reqired for pt at complete, MODA trunk/LE supports    Transfers Overall transfer level: Needs assistance Equipment used: Rolling walker (2 wheels) Transfers: Sit to/from Stand, Bed to chair/wheelchair/BSC Sit to Stand: Contact guard assist     Step pivot transfers: Contact guard assist, From elevated surface     General transfer comment: Step pivot transfer from EOB into recliner with use of RW, verbal cues requird for technique      Balance Overall balance assessment: Needs assistance Sitting-balance support: No upper extremity supported, Feet supported Sitting balance-Leahy Scale: Fair     Standing balance support: During functional activity, Bilateral upper extremity supported, Reliant on assistive device for balance Standing balance-Leahy Scale: Fair Standing  balance comment: Heavy reliance on RW in standing                            ADL either performed or assessed with clinical judgement   ADL Overall ADL's : Needs assistance/impaired Eating/Feeding: Set up;Sitting   Grooming: Wash/dry face;Wash/dry hands;Standing;Contact guard assist       Lower Body Bathing: Moderate assistance;Sit to/from stand   Upper Body Dressing : Set up;Sitting   Lower Body Dressing: Maximal assistance;Sit to/from stand Lower Body Dressing Details (indicate cue type and reason): Caregiver dependent at baseline Toilet Transfer: Minimal assistance;Rolling walker (2 wheels) Toilet Transfer Details (indicate cue type and reason): Step pivot into recliner - Simulated toilet transfer Toileting- Clothing Manipulation and Hygiene: Moderate assistance;Sit to/from stand Toileting - Clothing Manipulation Details (indicate cue type and reason): RW for support in standing during pericare     Functional mobility during ADLs: Contact guard assist;Rolling walker (2 wheels);Cueing for safety General ADL Comments: MODA standing pericare care, at the EOB, bowel/bladder incontence     Vision Baseline Vision/History: 1 Wears glasses                         Pertinent Vitals/Pain Pain Assessment Pain Assessment: No/denies pain     Extremity/Trunk Assessment Upper Extremity Assessment Upper Extremity Assessment: Generalized weakness   Lower Extremity Assessment Lower Extremity Assessment: Defer to PT evaluation;Generalized weakness       Communication Communication Communication: No apparent difficulties   Cognition Arousal: Alert Behavior During Therapy: Flat affect Cognition: No apparent impairments             OT - Cognition Comments: A/O x4                 Following commands: Intact       Cueing  General Comments   Cueing Techniques: Verbal cues;Tactile cues  Bowel/bladder incontence, skin breakdown at sacral area - RN aware   Exercises Exercises: Other exercises Other  Exercises Other Exercises: Edu: Role of OT eval, safe ADL completion, DME needs, D/C planning   Shoulder Instructions      Home Living Family/patient expects to be discharged to:: Private residence Living Arrangements: Children Available Help at Discharge: Family;Available 24 hours/day Type of Home: Apartment Home Access: Stairs to enter Entrance Stairs-Number of Steps: 1 Entrance Stairs-Rails: None Home Layout: One level     Bathroom Shower/Tub: Tub/shower unit;Sponge bathes at baseline   [employer]: Handicapped height Bathroom Accessibility: No   Home Equipment: Rollator (4 wheels);BSC/3in1;Shower seat;Wheelchair - manual;Cane - quad;Cane - single point;Adaptive equipment Adaptive Equipment: Reacher Additional Comments: sponge bathes at baseline. sleeps in a recliner at home      Prior Functioning/Environment Prior Level of Function : Needs assist             Mobility Comments: amb in house with rollator, has manual w/c for community distances, sleeps in recliner at home ADLs Comments: Mod I for ADLs, sponge bathing at baseline w/ daughter assisting to wash hair. assist with compression stockings. Daughter manages most IADLs such as meals but pt reports managing own meds.    OT Problem List: Decreased strength;Decreased activity tolerance;Impaired balance (sitting and/or standing);Decreased coordination;Decreased safety awareness;Decreased knowledge of use of DME or AE;Decreased knowledge of precautions   OT Treatment/Interventions: Self-care/ADL training;Therapeutic exercise;Energy conservation;DME and/or AE instruction;Patient/family education;Balance training      OT Goals(Current goals can be found in the care plan section)  Acute Rehab OT Goals Patient Stated Goal: Get stronger OT Goal Formulation: With patient Time For Goal Achievement: 05/31/24 Potential to Achieve Goals: Good ADL Goals Pt Will Perform Grooming: standing;with supervision Pt Will  Perform Lower Body Dressing: sit to/from stand;with caregiver independent in assisting Pt Will Transfer to Toilet: with modified independence;ambulating Pt Will Perform Toileting - Clothing Manipulation and hygiene: with modified independence;sit to/from stand   OT Frequency:  Min 2X/week    Co-evaluation              AM-PAC OT 6 Clicks Daily Activity     Outcome Measure Help from another person eating meals?: None Help from another person taking care of personal grooming?: A Little Help from another person toileting, which includes using toliet, bedpan, or urinal?: A Lot Help from another person bathing (including washing, rinsing, drying)?: A Lot Help from another person to put on and taking off regular upper body clothing?: None Help from another person to put on and taking off regular lower body clothing?: A Lot 6 Click Score: 17   End of Session Equipment Utilized During Treatment: Rolling walker (2 wheels) Nurse Communication: Mobility status;Other (comment) (BM, bladder incontence, sacral skin breakdown)  Activity Tolerance: Patient tolerated treatment well Patient left: in chair;with call bell/phone within reach;with chair alarm set  OT Visit Diagnosis: Unsteadiness on feet (R26.81);Other abnormalities of gait and mobility (R26.89);Muscle weakness (generalized) (M62.81)                Time: 9088-9053 OT Time Calculation (min): 35 min Charges:  OT General Charges $OT Visit: 1 Visit OT Evaluation $OT Eval Moderate Complexity: 1 Mod OT Treatments $Self Care/Home Management : 8-22 mins  Larraine Colas M.S. OTR/L  05/17/24, 10:50 AM

## 2024-05-17 NOTE — Progress Notes (Signed)
 Progress Note    Courtney Mack  FMW:[id number] DOB: 07-22-1955  DOA: 05/14/2024 PCP: [facility]      Brief Narrative:    Medical records reviewed and are as summarized below:  Courtney Mack is a 68 y.o. female with medical history significant for nephrostomy secondary to chronic left-sided hydronephrosis, frequent UTIs requiring hospitalization most recently for ESBL E. coli 04/06/2024, on prophylactic Bactrim  since February 2025, hypertension, COPD, morbid obesity, PE and DVT s/p IVC filter on Lovenox  shots, endometrial cancer, history of GI bleed, chronic HFpEF.  She presented to the hospital because of fever (temperature 101.2 F) and left flank pain.  In the ED, temperature was 100.3 F, heart rate 100, respiratory rate 22, BP 131/45, O2 saturation 94% on room air.  CT abdomen and pelvis with contrast IMPRESSION: 1. Left-sided hydronephrosis with considerable perinephric stranding, new from the prior exam, with nephroureteral catheter in satisfactory position; the decompressed urinary bladder raises suspicion for catheter malfunction or kinking. 2. Right renal calculus measuring approximately 6 mm without obstructive changes.   Dr. Jacolyn, the ED physician, had spoken with Dr. Carolee, urologist on-call, who recommended uncapping the nephrostomy tube and placing a bag to allow drainage in case of blockage of the nephroureteral catheter.  IR was also consulted for exchange of nephrostomy tube.  Patient was started on IV meropenem  because of recent ESBL E. coli.  UTI.   Assessment/Plan:   Active Problems:   Acute pyelonephritis   History of infection due to ESBL Escherichia coli   Hydronephrosis of left kidney   COPD (chronic obstructive pulmonary disease) (HCC)   Blocked nephrostomy tube with hydronephrosis and hydroureter (HCC)   Complicated UTI (urinary tract infection)   Chronic diastolic CHF (congestive heart failure) (HCC)    HTN (hypertension)   Endometrial cancer (HCC)   History of pulmonary embolism/DVT s/p IVC filter   Obesity, Class III, BMI 40-49.9 (morbid obesity) (HCC)    Body mass index is 40.88 kg/m.  (Class III obesity)   Sepsis secondary to acute pyelonephritis, recent ESBL E. coli UTI: Continue IV meropenem .  Urine culture on this patient showed insignificant growth. ESBL E. coli isolated from urine culture from 04/06/2024.  Chart review shows that she was discharged on IV meropenem  until 04/16/2024.  Staph epidermidis bacteremia: 2 out of 4 blood culture culture bottles positive.  Continue IV vancomycin .  Consulted Dr. Fayette, ID specialist to assist with management.   Left hydronephrosis, history of left nephrostomy tube/nephroureteral catheter: Plan to exchange left nephrostomy tube by IR today. Patient said that nephrostomy tube and left nephroureteral catheter has been in place since August 2025.   COPD: Continue bronchodilators   Chronic diastolic CHF: Compensated.   Comorbidities include hypertension, hyperlipidemia, history of PE and DVT s/p IVC filter, history of endometrial cancer   Diet Order             Diet regular Fluid consistency: Thin  Diet effective now                                  Consultants: Interventional radiologist Neurologist ID specialist  Procedures: Plan for left nephrostomy tube exchange    Medications:    enoxaparin   30 mg Subcutaneous Q12H   midodrine   10 mg Oral TID WC   pantoprazole   40 mg Oral BID   rosuvastatin   10 mg Oral Daily   Continuous Infusions:  meropenem  (MERREM ) IV 1 g (05/17/24 0551)   vancomycin  750 mg (05/16/24 1734)     Anti-infectives (From admission, onward)    Start     Dose/Rate Route Frequency Ordered Stop   05/17/24 1400  vancomycin  (VANCOCIN ) IVPB 1000 mg/200 mL premix  Status:  Discontinued        1,000 mg 200 mL/hr over 60 Minutes Intravenous Every 48 hours 05/15/24 1720  05/16/24 1230   05/16/24 1800  vancomycin  (VANCOREADY) IVPB 750 mg/150 mL        750 mg 150 mL/hr over 60 Minutes Intravenous Every 24 hours 05/16/24 1230     05/15/24 1800  vancomycin  (VANCOREADY) IVPB 2000 mg/400 mL        2,000 mg 200 mL/hr over 120 Minutes Intravenous NOW 05/15/24 1709 05/15/24 1941   05/14/24 2200  meropenem  (MERREM ) 1 g in sodium chloride  0.9 % 100 mL IVPB        1 g 200 mL/hr over 30 Minutes Intravenous Every 8 hours 05/14/24 2134                Family Communication/Anticipated D/C date and plan/Code Status   DVT prophylaxis: enoxaparin  (LOVENOX ) injection 30 mg Start: 05/15/24 0800     Code Status: Full Code  Family Communication: None Disposition Plan: Discharge home   Status is: Inpatient Remains inpatient appropriate because: Sepsis from pyelonephritis, Staph epidermidis bacteremia, left hydronephrosis         Subjective:   Interval events noted.  She had dark stool yesterday but this is nothing new because she has been taking iron  pills.  Left flank pain is better.  No fever or chills.  Objective:    Vitals:   05/16/24 2107 05/16/24 2310 05/17/24 0504 05/17/24 0741  BP: (!) 87/30 (!) 129/45 (!) 119/36 (!) 112/50  Pulse: 69 63 67 69  Resp: 16  18 18   Temp: 98.4 F (36.9 C)  98.1 F (36.7 C) 98.6 F (37 C)  TempSrc:    Oral  SpO2: 95%  96% 96%  Weight:      Height:       No data found.   Intake/Output Summary (Last 24 hours) at 05/17/2024 1200 Last data filed at 05/16/2024 2321 Gross per 24 hour  Intake 480 ml  Output 1550 ml  Net -1070 ml   Filed Weights   05/14/24 2339  Weight: 91.8 kg    Exam:  GEN: NAD SKIN: Warm and dry EYES: No pallor or icterus ENT: MMM CV: RRR PULM: CTA B ABD: soft, obese, NT, +BS CNS: AAO x 3, non focal EXT: No edema or tenderness GU: Left nephrostomy tube with amber urine in the urine bag       Data Reviewed:   I have personally reviewed following labs and imaging  studies:  Labs: Labs show the following:   Basic Metabolic Panel: Recent Labs  Lab 05/14/24 1924 05/15/24 0444 05/16/24 0613 05/17/24 0557  NA 136 137 138 140  K 4.1 4.0 3.6 3.9  CL 106 105 108 110  CO2 21* 22 22 21*  GLUCOSE 160* 129* 92 93  BUN 19 19 19 18   CREATININE 0.91 0.97 1.09* 0.92  CALCIUM  8.9 8.3* 7.9* 8.1*   GFR Estimated Creatinine Clearance: 57 mL/min (by C-G formula based on SCr of 0.92 mg/dL). Liver Function Tests: Recent Labs  Lab 05/14/24 1924  AST 25  ALT 11  ALKPHOS 55  BILITOT 1.0  PROT 7.3  ALBUMIN 3.1*   No results  for input(s): LIPASE, AMYLASE in the last 168 hours. No results for input(s): AMMONIA in the last 168 hours. Coagulation profile No results for input(s): INR, PROTIME in the last 168 hours.  CBC: Recent Labs  Lab 05/14/24 1924 05/15/24 0444 05/16/24 0613 05/17/24 0557  WBC 10.0 11.3* 5.8 4.6  NEUTROABS 8.9*  --   --   --   HGB 11.0* 9.5* 8.3* 8.4*  HCT 35.7* 31.2* 28.1* 28.1*  MCV 85.0 84.1 88.6 85.2  PLT 261 254 209 244   Cardiac Enzymes: No results for input(s): CKTOTAL, CKMB, CKMBINDEX, TROPONINI in the last 168 hours. BNP (last 3 results) No results for input(s): PROBNP in the last 8760 hours. CBG: No results for input(s): GLUCAP in the last 168 hours. D-Dimer: No results for input(s): DDIMER in the last 72 hours. Hgb A1c: No results for input(s): HGBA1C in the last 72 hours. Lipid Profile: No results for input(s): CHOL, HDL, LDLCALC, TRIG, CHOLHDL, LDLDIRECT in the last 72 hours. Thyroid function studies: No results for input(s): TSH, T4TOTAL, T3FREE, THYROIDAB in the last 72 hours.  Invalid input(s): FREET3 Anemia work up: No results for input(s): VITAMINB12, FOLATE, FERRITIN, TIBC, IRON , RETICCTPCT in the last 72 hours. Sepsis Labs: Recent Labs  Lab 05/14/24 1924 05/15/24 0444 05/16/24 0613 05/17/24 0557  WBC 10.0 11.3* 5.8 4.6   LATICACIDVEN 1.0  --   --   --     Microbiology Recent Results (from the past 240 hours)  Blood Culture (routine x 2)     Status: None (Preliminary result)   Collection Time: 05/14/24  7:24 PM   Specimen: BLOOD  Result Value Ref Range Status   Specimen Description BLOOD BLOOD LEFT ARM  Final   Special Requests   Final    BOTTLES DRAWN AEROBIC AND ANAEROBIC Blood Culture results may not be optimal due to an inadequate volume of blood received in culture bottles   Culture   Final    NO GROWTH 3 DAYS Performed at [facility], [address], [place], [place] [address]    Report Status PENDING  Incomplete  Blood Culture (routine x 2)     Status: Abnormal (Preliminary result)   Collection Time: 05/14/24  7:24 PM   Specimen: BLOOD RIGHT HAND  Result Value Ref Range Status   Specimen Description   Final    BLOOD RIGHT HAND Performed at [facility], [address], [place], [place] [address]    Special Requests   Final    BOTTLES DRAWN AEROBIC AND ANAEROBIC Blood Culture adequate volume Performed at [facility], [address] [place], [place] [address]    Culture  Setup Time   Final    GRAM POSITIVE COCCI IN BOTH AEROBIC AND ANAEROBIC BOTTLES CRITICAL RESULT CALLED TO, READ BACK BY AND VERIFIED WITH:  RAQUEL RODRIGUEZ GUZMAN, PHARM D AT 1703 05/15/24 RAM    Culture (A)  Final    STAPHYLOCOCCUS EPIDERMIDIS THE SIGNIFICANCE OF ISOLATING THIS ORGANISM FROM A SINGLE SET OF BLOOD CULTURES WHEN MULTIPLE SETS ARE DRAWN IS UNCERTAIN. PLEASE NOTIFY THE MICROBIOLOGY DEPARTMENT WITHIN ONE WEEK IF SPECIATION AND SENSITIVITIES ARE REQUIRED. Performed at [facility], [address], [place], [place] [address]    Report Status PENDING  Incomplete  Blood Culture ID Panel (Reflexed)     Status: Abnormal   Collection Time: 05/14/24  7:24 PM  Result Value Ref Range Status   Enterococcus faecalis NOT DETECTED NOT DETECTED Final   Enterococcus Faecium  NOT  DETECTED NOT DETECTED Final   Listeria monocytogenes NOT DETECTED NOT DETECTED Final   Staphylococcus species DETECTED (A) NOT DETECTED Final    Comment: CRITICAL RESULT CALLED TO, READ BACK BY AND VERIFIED WITH: RAQUEL RODRIGUEZ GUZMAN, PHARM D AT 1703 05/15/24 RAM    Staphylococcus aureus (BCID) NOT DETECTED NOT DETECTED Final   Staphylococcus epidermidis DETECTED (A) NOT DETECTED Final    Comment: Methicillin (oxacillin) resistant coagulase negative staphylococcus. Possible blood culture contaminant (unless isolated from more than one blood culture draw or clinical case suggests pathogenicity). No antibiotic treatment is indicated for blood  culture contaminants. RAQUEL RODRIGUEZ GUZMAN, PHARM D AT 1703 05/15/24 RAM    Staphylococcus lugdunensis NOT DETECTED NOT DETECTED Final   Streptococcus species NOT DETECTED NOT DETECTED Final   Streptococcus agalactiae NOT DETECTED NOT DETECTED Final   Streptococcus pneumoniae NOT DETECTED NOT DETECTED Final   Streptococcus pyogenes NOT DETECTED NOT DETECTED Final   A.calcoaceticus-baumannii NOT DETECTED NOT DETECTED Final   Bacteroides fragilis NOT DETECTED NOT DETECTED Final   Enterobacterales NOT DETECTED NOT DETECTED Final   Enterobacter cloacae complex NOT DETECTED NOT DETECTED Final   Escherichia coli NOT DETECTED NOT DETECTED Final   Klebsiella aerogenes NOT DETECTED NOT DETECTED Final   Klebsiella oxytoca NOT DETECTED NOT DETECTED Final   Klebsiella pneumoniae NOT DETECTED NOT DETECTED Final   Proteus species NOT DETECTED NOT DETECTED Final   Salmonella species NOT DETECTED NOT DETECTED Final   Serratia marcescens NOT DETECTED NOT DETECTED Final   Haemophilus influenzae NOT DETECTED NOT DETECTED Final   Neisseria meningitidis NOT DETECTED NOT DETECTED Final   Pseudomonas aeruginosa NOT DETECTED NOT DETECTED Final   Stenotrophomonas maltophilia NOT DETECTED NOT DETECTED Final   Candida albicans NOT DETECTED NOT DETECTED Final   Candida  auris NOT DETECTED NOT DETECTED Final   Candida glabrata NOT DETECTED NOT DETECTED Final   Candida krusei NOT DETECTED NOT DETECTED Final   Candida parapsilosis NOT DETECTED NOT DETECTED Final   Candida tropicalis NOT DETECTED NOT DETECTED Final   Cryptococcus neoformans/gattii NOT DETECTED NOT DETECTED Final   Methicillin resistance mecA/C DETECTED (A) NOT DETECTED Final    CommentBETHA LILIA EVERN ELIZA BERDINE D AT 1703 05/15/24 RAM Performed at [facility], [address] [place], [place] [address]   Resp panel by RT-PCR (RSV, Flu A&B, Covid) Anterior Nasal Swab     Status: None   Collection Time: 05/14/24  7:47 PM   Specimen: Anterior Nasal Swab  Result Value Ref Range Status   SARS Coronavirus 2 by RT PCR NEGATIVE NEGATIVE Final    Comment: (NOTE) SARS-CoV-2 target nucleic acids are NOT DETECTED.  The SARS-CoV-2 RNA is generally detectable in upper respiratory specimens during the acute phase of infection. The lowest concentration of SARS-CoV-2 viral copies this assay can detect is 138 copies/mL. A negative result does not preclude SARS-Cov-2 infection and should not be used as the sole basis for treatment or other patient management decisions. A negative result may occur with  improper specimen collection/handling, submission of specimen other than nasopharyngeal swab, presence of viral mutation(s) within the areas targeted by this assay, and inadequate number of viral copies(<138 copies/mL). A negative result must be combined with clinical observations, patient history, and epidemiological information. The expected result is Negative.  Fact Sheet for Patients:  bloggercourse.com  Fact Sheet for Healthcare Providers:  seriousbroker.it  This test is no t yet approved or cleared by the [place]  FDA and  has been authorized for  detection and/or diagnosis of SARS-CoV-2 by FDA under an Emergency Use  Authorization (EUA). This EUA will remain  in effect (meaning this test can be used) for the duration of the COVID-19 declaration under Section 564(b)(1) of the Act, 21 U.S.C.section 360bbb-3(b)(1), unless the authorization is terminated  or revoked sooner.       Influenza A by PCR NEGATIVE NEGATIVE Final   Influenza B by PCR NEGATIVE NEGATIVE Final    Comment: (NOTE) The Xpert Xpress SARS-CoV-2/FLU/RSV plus assay is intended as an aid in the diagnosis of influenza from Nasopharyngeal swab specimens and should not be used as a sole basis for treatment. Nasal washings and aspirates are unacceptable for Xpert Xpress SARS-CoV-2/FLU/RSV testing.  Fact Sheet for Patients: bloggercourse.com  Fact Sheet for Healthcare Providers: seriousbroker.it  This test is not yet approved or cleared by the [place]  FDA and has been authorized for detection and/or diagnosis of SARS-CoV-2 by FDA under an Emergency Use Authorization (EUA). This EUA will remain in effect (meaning this test can be used) for the duration of the COVID-19 declaration under Section 564(b)(1) of the Act, 21 U.S.C. section 360bbb-3(b)(1), unless the authorization is terminated or revoked.     Resp Syncytial Virus by PCR NEGATIVE NEGATIVE Final    Comment: (NOTE) Fact Sheet for Patients: bloggercourse.com  Fact Sheet for Healthcare Providers: seriousbroker.it  This test is not yet approved or cleared by the [place]  FDA and has been authorized for detection and/or diagnosis of SARS-CoV-2 by FDA under an Emergency Use Authorization (EUA). This EUA will remain in effect (meaning this test can be used) for the duration of the COVID-19 declaration under Section 564(b)(1) of the Act, 21 U.S.C. section 360bbb-3(b)(1), unless the authorization is terminated or revoked.  Performed at [facility], [address], [place], [place] [address]   Urine Culture (for pregnant, neutropenic or urologic patients or patients with an indwelling urinary catheter)     Status: Abnormal   Collection Time: 05/15/24 11:40 AM   Specimen: Urine, Clean Catch  Result Value Ref Range Status   Specimen Description   Final    URINE, CLEAN CATCH Performed at [facility], [address], [place], [place] [address]    Special Requests   Final    NONE Performed at [facility], [address] [place], [place] [address]    Culture (A)  Final    <10,000 COLONIES/mL INSIGNIFICANT GROWTH Performed at [facility], [address] [place], [place] [address]    Report Status 05/16/2024 FINAL  Final  Culture, blood (Routine X 2) w Reflex to ID Panel     Status: None (Preliminary result)   Collection Time: 05/16/24 10:50 AM   Specimen: BLOOD  Result Value Ref Range Status   Specimen Description BLOOD BLOOD LEFT HAND  Final   Special Requests   Final    BOTTLES DRAWN AEROBIC AND ANAEROBIC Blood Culture adequate volume   Culture   Final    NO GROWTH < 24 HOURS Performed at [facility], [address] [place], [place] [address]    Report Status PENDING  Incomplete  Culture, blood (Routine X 2) w Reflex to ID Panel     Status: None (Preliminary result)   Collection Time: 05/16/24 11:03 AM   Specimen: BLOOD LEFT HAND  Result Value Ref Range Status   Specimen Description BLOOD LEFT HAND  Final   Special Requests Blood Culture adequate volume  Final   Culture  Final    NO GROWTH < 24 HOURS Performed at [facility], [address] [place], [place] [address]    Report Status PENDING  Incomplete    Procedures and diagnostic studies:  No results found.             LOS: 0 days   Yanixan Mellinger  [facility]   Pager on [email or web address]. If 7PM-7AM, please contact night-coverage at [email or web address]     05/17/2024, 12:00 PM

## 2024-05-17 NOTE — Progress Notes (Signed)
 Patient is from home. Therapy recs are for [facility] PT/OT.  Patient was advised of recommendations for home health pt and ot. She declined these services three times. If home health needs arise she will consult with her pcp to arrange.   No further TOC needs identified.        Patient Goals and CMS Choice        Expected Discharge Plan and Services                                                Prior Living Arrangements/Services                       Activities of Daily Living   ADL Screening (condition at time of admission) Independently performs ADLs?: No Does the patient have a NEW difficulty with bathing/dressing/toileting/self-feeding that is expected to last >3 days?: No Does the patient have a NEW difficulty with getting in/out of bed, walking, or climbing stairs that is expected to last >3 days?: No Does the patient have a NEW difficulty with communication that is expected to last >3 days?: No Is the patient deaf or have difficulty hearing?: No Does the patient have difficulty seeing, even when wearing glasses/contacts?: No Does the patient have difficulty concentrating, remembering, or making decisions?: No  Permission Sought/Granted                  Emotional Assessment              Admission diagnosis:  Pyelonephritis [N12] Complicated urinary tract infection [N39.0] Complicated UTI (urinary tract infection) [N39.0] Patient Active Problem List   Diagnosis Date Noted   History of infection due to ESBL Escherichia coli 05/15/2024   UTI due to extended-spectrum beta lactamase (ESBL) producing Escherichia coli 04/06/2024   COPD exacerbation (HCC) 08/27/2023   Other pulmonary embolism without acute cor pulmonale (HCC) 08/07/2023   Chronic deep vein thrombosis (DVT) of proximal vein of right lower extremity (HCC) 08/05/2023   Acute pulmonary embolism (HCC) 08/04/2023   Dysuria 06/22/2023   SOB (shortness of breath) 06/18/2023   HTN  (hypertension) 06/18/2023   Complicated UTI (urinary tract infection) 05/26/2023   Diarrhea 05/26/2023   Blocked nephrostomy tube with hydronephrosis and hydroureter (HCC) 04/05/2023   Hypertension    Pressure injury of skin 02/11/2023   Severe sepsis with acute organ dysfunction (HCC) 02/10/2023   Obesity (BMI 30-39.9) 11/22/2022   Sepsis due to gram-negative UTI (HCC) 11/21/2022   Acute pyelonephritis 11/21/2022   Dyspnea 11/21/2022   Chest pain 11/21/2022   Hypokalemia 10/25/2022   Chronic obstructive pulmonary disease (COPD) (HCC) 10/24/2022   Iron  deficiency anemia 10/24/2022   Symptomatic anemia 08/08/2022   COPD (chronic obstructive pulmonary disease) (HCC) 08/07/2022   Sepsis due to pneumonia (HCC) 06/12/2022   Multifocal pneumonia 06/12/2022   GERD without esophagitis 06/12/2022   Generalized weakness 06/12/2022   Nephrostomy status (HCC) 06/12/2022   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) 04/16/2022   GI bleed 03/14/2022   Acute on chronic anemia 02/24/2022   Hypotension 02/24/2022   Acute cystitis with hematuria    Lymphedema 02/08/2022   Hematuria 12/24/2021   History of pulmonary embolism/DVT s/p IVC filter 12/24/2021   Chronic radiation cystitis 11/17/2021   Hydronephrosis of left kidney    Gastric antral vascular ectasia  Complicated urinary tract infection 09/13/2021   AKI (acute kidney injury) 09/13/2021   Melena 07/18/2021   Acute on chronic blood loss anemia 07/18/2021   HLD (hyperlipidemia) 06/20/2021   Chronic diastolic CHF (congestive heart failure) (HCC) 06/20/2021   Risk factors for obstructive sleep apnea 11/10/2020   IDA (iron  deficiency anemia) 09/21/2020   Endometrial cancer (HCC) 09/13/2020   Recurrent deep vein thrombosis (DVT) (HCC) 09/06/2020   Obesity, Class III, BMI 40-49.9 (morbid obesity) (HCC) 08/23/2020   Dyslipidemia 08/23/2020   PCP:  [facility]:   [id number] [place], [place] - [address] AT [facility] [address] [place] [address] Phone: [phone] Fax: [phone]     Social Determinants of Health (SDOH) Interventions    Readmission Risk Interventions    04/07/2024    3:24 PM 08/07/2023   10:26 AM 07/24/2023   11:14 AM  Readmission Risk Prevention Plan  Transportation Screening Complete  Complete  PCP or Specialist Appt within 3-5 Days Complete    Social Work Consult for Recovery Care Planning/Counseling Complete    Palliative Care Screening Not Applicable    Medication Review Oceanographer) Complete Complete Complete  PCP or Specialist appointment within 3-5 days of discharge  Complete Complete  SW Recovery Care/Counseling Consult  Complete Complete  Palliative Care Screening  Not Applicable Not Applicable  Skilled Nursing Facility  Not Applicable Not Applicable

## 2024-05-17 NOTE — Evaluation (Signed)
 Physical Therapy Evaluation Patient Details Name: Courtney Mack MRN: [id number] DOB: Jun 05, 1956 Today's Date: 05/17/2024  History of Present Illness  Pt is a 68 y/o F admitted on 05/14/24 after presenting with c/o fever & flank pain. Pt is being treated for acute pyelonephritis, sepsis. PMH: nephrostomy 2/2 chronic L sided hydronephrosis, frequent UTIs, HTN, COPD, morbid obesity, PE/DVT s/p IVC filter, endometrial CA, GI bleed, HFpEF  Clinical Impression  Pt seen for PT evaluation with pt agreeable, reporting she was doing well at home with family assistance, ambulating with rollator. On this date, pt requires CGA for sit>stand, ambulates to door & back with RW & CGA. Pt limited by chronic arthritic BLE knee pain, also requests to return to bed 2/2 sitting up in recliner awhile & back discomfort. Recommend ongoing PT treatment & mobilization while in acute setting.        If plan is discharge home, recommend the following: A little help with walking and/or transfers;A little help with bathing/dressing/bathroom;Assistance with cooking/housework;Assist for transportation;Help with stairs or ramp for entrance   Can travel by private vehicle        Equipment Recommendations None recommended by PT  Recommendations for Other Services       Functional Status Assessment Patient has had a recent decline in their functional status and demonstrates the ability to make significant improvements in function in a reasonable and predictable amount of time.     Precautions / Restrictions Precautions Precautions: Fall Precaution/Restrictions Comments: L nephrostomy tube Restrictions Weight Bearing Restrictions Per Provider Order: No      Mobility  Bed Mobility   Bed Mobility: Sit to Supine       Sit to supine: Mod assist (assistance to elevate BLE onto bed)        Transfers Overall transfer level: Needs assistance Equipment used: Rolling walker (2 wheels) Transfers: Sit to/from  Stand Sit to Stand: Contact guard assist           General transfer comment: sit>stand from recliner    Ambulation/Gait Ambulation/Gait assistance: Contact guard assist Gait Distance (Feet): 20 Feet Assistive device: Rolling walker (2 wheels) Gait Pattern/deviations: Decreased step length - left, Decreased step length - right, Decreased stride length, Trunk flexed Gait velocity: decreased     General Gait Details: ambulated to door & back  Stairs            Wheelchair Mobility     Tilt Bed    Modified Rankin (Stroke Patients Only)       Balance Overall balance assessment: Needs assistance Sitting-balance support: No upper extremity supported, Feet supported Sitting balance-Leahy Scale: Fair     Standing balance support: Bilateral upper extremity supported, Reliant on assistive device for balance, During functional activity Standing balance-Leahy Scale: Fair                               Pertinent Vitals/Pain Pain Assessment Pain Assessment: Faces Faces Pain Scale: Hurts a little bit Pain Location: back, chronic arthritic knee pain Pain Descriptors / Indicators: Discomfort Pain Intervention(s): Monitored during session, Limited activity within patient's tolerance    Home Living Family/patient expects to be discharged to:: Private residence Living Arrangements: Children Available Help at Discharge: Family;Available 24 hours/day Type of Home: Apartment Home Access: Stairs to enter Entrance Stairs-Rails: None Entrance Stairs-Number of Steps: 1   Home Layout: One level Home Equipment: Rollator (4 wheels);BSC/3in1;Shower seat;Wheelchair - manual;Cane - quad;Cane - single point;Adaptive equipment Additional  Comments: sponge bathes at baseline, sleeps in a recliner at home    Prior Function Prior Level of Function : Needs assist             Mobility Comments: amb in house with rollator, has manual w/c for community distances, sleeps in  recliner at home ADLs Comments: Mod I for ADLs, sponge bathing at baseline w/ daughter assisting to wash hair. assist with compression stockings. Daughter manages most IADLs such as meals but pt reports managing own meds.     Extremity/Trunk Assessment   Upper Extremity Assessment Upper Extremity Assessment: Overall WFL for tasks assessed    Lower Extremity Assessment Lower Extremity Assessment: Generalized weakness (chronic BLE arthritic knee pain)    Cervical / Trunk Assessment Cervical / Trunk Assessment: Kyphotic  Communication   Communication Communication: No apparent difficulties    Cognition Arousal: Alert Behavior During Therapy: WFL for tasks assessed/performed   PT - Cognitive impairments: No apparent impairments                         Following commands: Intact       Cueing Cueing Techniques: Verbal cues     General Comments General comments (skin integrity, edema, etc.): Bowel/bladder incontence, skin breakdown at sacral area - RN aware    Exercises     Assessment/Plan    PT Assessment Patient needs continued PT services  PT Problem List Decreased mobility;Decreased activity tolerance;Decreased strength;Pain       PT Treatment Interventions DME instruction;Balance training;Gait training;Neuromuscular re-education;Stair training;Functional mobility training;Patient/family education;Therapeutic activities;Manual techniques;Therapeutic exercise    PT Goals (Current goals can be found in the Care Plan section)  Acute Rehab PT Goals Patient Stated Goal: treat UTI PT Goal Formulation: With patient Time For Goal Achievement: 05/31/24 Potential to Achieve Goals: Good    Frequency Min 2X/week     Co-evaluation               AM-PAC PT 6 Clicks Mobility  Outcome Measure Help needed turning from your back to your side while in a flat bed without using bedrails?: A Little Help needed moving from lying on your back to sitting on the  side of a flat bed without using bedrails?: A Lot Help needed moving to and from a bed to a chair (including a wheelchair)?: A Little Help needed standing up from a chair using your arms (e.g., wheelchair or bedside chair)?: A Little Help needed to walk in hospital room?: A Little Help needed climbing 3-5 steps with a railing? : A Lot 6 Click Score: 16    End of Session   Activity Tolerance: Patient limited by fatigue Patient left: in bed;with call bell/phone within reach;with nursing/sitter in room   PT Visit Diagnosis: Muscle weakness (generalized) (M62.81);Difficulty in walking, not elsewhere classified (R26.2)    Time: 8884-8875 PT Time Calculation (min) (ACUTE ONLY): 9 min   Charges:   PT Evaluation $PT Eval Low Complexity: 1 Low   PT General Charges $$ ACUTE PT VISIT: 1 Visit         Courtney Mack, PT, DPT 05/17/24, 11:30 AM   Courtney Mack 05/17/2024, 11:29 AM

## 2024-05-17 NOTE — Plan of Care (Signed)

## 2024-05-18 DIAGNOSIS — N1330 Unspecified hydronephrosis: Secondary | ICD-10-CM | POA: Diagnosis not present

## 2024-05-18 DIAGNOSIS — B957 Other staphylococcus as the cause of diseases classified elsewhere: Secondary | ICD-10-CM | POA: Diagnosis not present

## 2024-05-18 DIAGNOSIS — R7881 Bacteremia: Secondary | ICD-10-CM | POA: Diagnosis not present

## 2024-05-18 DIAGNOSIS — N99528 Other complication of other external stoma of urinary tract: Secondary | ICD-10-CM | POA: Diagnosis not present

## 2024-05-18 DIAGNOSIS — N390 Urinary tract infection, site not specified: Secondary | ICD-10-CM | POA: Diagnosis not present

## 2024-05-18 NOTE — Plan of Care (Signed)
   Problem: Clinical Measurements: Goal: Ability to maintain clinical measurements within normal limits will improve Outcome: Progressing Goal: Will remain free from infection Outcome: Progressing Goal: Respiratory complications will improve Outcome: Progressing Goal: Cardiovascular complication will be avoided Outcome: Progressing   Problem: Activity: Goal: Risk for activity intolerance will decrease Outcome: Progressing

## 2024-05-18 NOTE — Consult Note (Signed)
 NAME: Courtney Mack  DOB: 1956/04/18  MRN: [id number]  Date/Time: 05/18/2024 1:38 AM  REQUESTING PROVIDER: Dr.Ayiku Subjective:  REASON FOR CONSULT: Staph epidermidis bacteremia ? Courtney Mack is a 68 y.o. with a history of history of endometrial carcinoma, radiation cystitis, left nephrostomy tube for hydronephrosis which was placed in June 2023 with frequent exchanges, recurrent UTI, anemia secondary to GAVE, DVT, PE, IVC filter in the past presented from home on 05/14/2024 with fever of 101.2.  She also had burning urination. She was recently in the hospital between 04/16/2024 until 04/20/2024 for UTI due to ESBL E. coli and was given 10 days of IV carbapenem.  Vitals in the ED on 05/14/2024 showed a BP of 110/58, temperature 100.3, pulse 78 and sats of 98% WBC was 10, Hb 11, platelet 261 and creatinine of 0.91.  No urine analysis was sent.  Blood culture was sent and that has grown Staph epidermidis in 1 out of the 2 sets in 1 bottle.  I am asked to see the patient for the same. Urine culture had multiple organisms less than 10,000 colonies.  CT abdomen and pelvis with contrast showed considerable perinephric stranding on the left new from the prior examination A nephroureteral catheter was noted in satisfactory position Left-sided hydronephrosis noted with the decompressed urinary bladder raising suspicion for catheter malfunctioning or kinking There was a right renal calculus of 6 mm. As the patient had ESBL E. coli recently she was placed on meropenem .  And vancomycin  for the Staph epidermidis.  The primary team consulted urology Dr. Carolee who gave instructions to uncap her nephrostomy tube and place it to drainage to relieve the obstruction and hydronephrosis.  But that was never carried out and until he saw her the next day when he uncapped it and the urine immediately pulled out.  The tube had flushed easily. Today the patient is feeling better No fever since hospitalization Mild  left flank pain She also had the nephrostomy tube exchanged by IR on 05/17/2024   Past Medical History:  Diagnosis Date   Acute bilateral deep vein thrombosis (DVT) of femoral veins (HCC) 01/02/2021   Acute massive pulmonary embolism (HCC) 08/25/2020   Last Assessment & Plan:  Formatting of this note might be different from the original. Post vena caval filter and on xarelto  and O2   Anemia    ARF (acute respiratory failure) (HCC)    Cellulitis of left lower leg 06/20/2021   CHF (congestive heart failure) (HCC)    COVID-19    Diabetes mellitus without complication (HCC)    DVT (deep venous thrombosis) (HCC) 09/06/2020   Endometrial cancer (HCC)    GAVE (gastric antral vascular ectasia)    GERD (gastroesophageal reflux disease)    High cholesterol    Hx of blood clots    Hyperlipidemia    Hypertension    IDA (iron  deficiency anemia) 09/21/2020   Obesity    Pneumonia 06/11/2022   recovered   Pressure injury of skin 06/21/2021   Pulmonary embolism (HCC)    Sepsis (HCC)    Thrombocytopenia    Urinary tract infection 09/13/2021    Past Surgical History:  Procedure Laterality Date   CATARACT EXTRACTION W/PHACO Right 06/04/2022   Procedure: CATARACT EXTRACTION PHACO AND INTRAOCULAR LENS PLACEMENT (IOC) COMPLICATED RIGHT  31.83  02:17.4;  Surgeon: Jaye Fallow, MD;  Location: [facility];  Service: Ophthalmology;  Laterality: Right;   CATARACT EXTRACTION W/PHACO Left 07/09/2022   Procedure: CATARACT EXTRACTION PHACO AND INTRAOCULAR  LENS PLACEMENT (IOC);  Surgeon: Jaye Fallow, MD;  Location: [facility];  Service: Ophthalmology;  Laterality: Left;  22.75 1:40.3   COLONOSCOPY N/A 06/21/2021   Procedure: COLONOSCOPY;  Surgeon: Toledo, Ladell POUR, MD;  Location: [facility];  Service: Gastroenterology;  Laterality: N/A;   CYSTOSCOPY W/ URETERAL STENT PLACEMENT Left 09/11/2021   Procedure: CYSTOSCOPY WITH RETROGRADE PYELOGRAM/URETERAL STENT PLACEMENT;  Surgeon:  Twylla Glendia BROCKS, MD;  Location: [facility];  Service: Urology;  Laterality: Left;   CYSTOSCOPY W/ URETERAL STENT PLACEMENT Left 11/17/2021   Procedure: CYSTOSCOPY WITH STENT REPLACEMENT;  Surgeon: Watt Rush, MD;  Location: [facility];  Service: Urology;  Laterality: Left;   CYSTOSCOPY WITH FULGERATION  11/17/2021   Procedure: CYSTOSCOPY WITH FULGERATION;  Surgeon: Watt Rush, MD;  Location: [facility];  Service: Urology;;   CYSTOSCOPY WITH STENT PLACEMENT Left 10/17/2021   Procedure: CYSTOSCOPY WITH STENT PLACEMENT;  Surgeon: Twylla Glendia BROCKS, MD;  Location: [facility];  Service: Urology;  Laterality: Left;   CYSTOSCOPY WITH URETEROSCOPY, STONE BASKETRY AND STENT PLACEMENT Left 10/16/2021   Procedure: CYSTOSCOPY WITH URETEROSCOPY  AND STENT REMOVAL;  Surgeon: Twylla Glendia BROCKS, MD;  Location: [facility];  Service: Urology;  Laterality: Left;   ENTEROSCOPY N/A 08/17/2021   Procedure: ENTEROSCOPY;  Surgeon: Unk Corinn Skiff, MD;  Location: [facility];  Service: Gastroenterology;  Laterality: N/A;   ESOPHAGOGASTRODUODENOSCOPY N/A 06/21/2021   Procedure: ESOPHAGOGASTRODUODENOSCOPY (EGD);  Surgeon: Toledo, Ladell POUR, MD;  Location: [facility];  Service: Gastroenterology;  Laterality: N/A;   ESOPHAGOGASTRODUODENOSCOPY (EGD) WITH PROPOFOL  N/A 08/17/2021   Procedure: ESOPHAGOGASTRODUODENOSCOPY (EGD) WITH PROPOFOL ;  Surgeon: Unk Corinn Skiff, MD;  Location: [facility];  Service: Gastroenterology;  Laterality: N/A;   ESOPHAGOGASTRODUODENOSCOPY (EGD) WITH PROPOFOL  N/A 10/14/2021   Procedure: ESOPHAGOGASTRODUODENOSCOPY (EGD) WITH PROPOFOL ;  Surgeon: Jinny Carmine, MD;  Location: [facility];  Service: Endoscopy;  Laterality: N/A;   ESOPHAGOGASTRODUODENOSCOPY (EGD) WITH PROPOFOL  N/A 02/01/2022   Procedure: ESOPHAGOGASTRODUODENOSCOPY (EGD) WITH PROPOFOL ;  Surgeon: Unk Corinn Skiff, MD;  Location: [facility];  Service: Gastroenterology;  Laterality: N/A;   ESOPHAGOGASTRODUODENOSCOPY (EGD) WITH  PROPOFOL  N/A 03/15/2022   Procedure: ESOPHAGOGASTRODUODENOSCOPY (EGD) WITH PROPOFOL ;  Surgeon: Therisa Bi, MD;  Location: [facility];  Service: Gastroenterology;  Laterality: N/A;   ESOPHAGOGASTRODUODENOSCOPY (EGD) WITH PROPOFOL  N/A 04/18/2022   Procedure: ESOPHAGOGASTRODUODENOSCOPY (EGD) WITH PROPOFOL ;  Surgeon: Unk Corinn Skiff, MD;  Location: [facility];  Service: Gastroenterology;  Laterality: N/A;   ESOPHAGOGASTRODUODENOSCOPY (EGD) WITH PROPOFOL  N/A 08/09/2022   Procedure: ESOPHAGOGASTRODUODENOSCOPY (EGD) WITH PROPOFOL ;  Surgeon: Therisa Bi, MD;  Location: [facility] END[facility]COPY;  Service: Gastroenterology;  Laterality: N/A;   GIVENS CAPSULE STUDY N/A 06/22/2021   Procedure: GIVENS CAPSULE STUDY;  Surgeon: Toledo, Ladell POUR, MD;  Location: [facility];  Service: Gastroenterology;  Laterality: N/A;   IR CONVERT LEFT NEPHROSTOMY TO NEPHROURETERAL CATH  08/29/2023   IR EXT NEPHROURETERAL CATH EXCHANGE  03/05/2024   IR NEPHROSTOMY EXCHANGE LEFT  02/25/2022   IR NEPHROSTOMY EXCHANGE LEFT  03/27/2022   IR NEPHROSTOMY EXCHANGE LEFT  05/22/2022   IR NEPHROSTOMY EXCHANGE LEFT  07/24/2022   IR NEPHROSTOMY EXCHANGE LEFT  09/24/2022   IR NEPHROSTOMY EXCHANGE LEFT  11/21/2022   IR NEPHROSTOMY EXCHANGE LEFT  02/17/2023   IR NEPHROSTOMY EXCHANGE LEFT  04/06/2023   IR NEPHROSTOMY EXCHANGE LEFT  05/27/2023   IR NEPHROSTOMY EXCHANGE LEFT  07/28/2023   IR NEPHROSTOMY PLACEMENT LEFT  12/27/2021   IVC FILTER INSERTION N/A 08/18/2020   Procedure: IVC FILTER INSERTION;  Surgeon: Jama Cordella MATSU, MD;  Location:  [facility];  Service: Cardiovascular;  Laterality: N/A;   PERIPHERAL VASCULAR THROMBECTOMY Bilateral 01/03/2021   Procedure: PERIPHERAL VASCULAR THROMBECTOMY;  Surgeon: Marea Selinda RAMAN, MD;  Location: [facility];  Service: Cardiovascular;  Laterality: Bilateral;   PULMONARY THROMBECTOMY N/A 08/18/2020   Procedure: PULMONARY THROMBECTOMY;  Surgeon: Jama Cordella MATSU, MD;  Location: [facility];  Service: Cardiovascular;  Laterality: N/A;   TUBAL LIGATION     VISCERAL ANGIOGRAPHY N/A 07/18/2021   Procedure: VISCERAL ANGIOGRAPHY;  Surgeon: Marea Selinda RAMAN, MD;  Location: [facility];  Service: Cardiovascular;  Laterality: N/A;    Social History   Socioeconomic History   Marital status: Single    Spouse name: Not on file   Number of children: Not on file   Years of education: Not on file   Highest education level: Not on file  Occupational History   Not on file  Tobacco Use   Smoking status: Never   Smokeless tobacco: Never   Tobacco comments:    smoked 2-4 cigarettes for about 2-3 weeks   Vaping Use   Vaping status: Never Used  Substance and Sexual Activity   Alcohol  use: Not Currently   Drug use: Never   Sexual activity: Not Currently  Other Topics Concern   Not on file  Social History Narrative   Not on file   Social Drivers of Health   Financial Resource Strain: Not on file  Food Insecurity: Food Insecurity Present (05/14/2024)   Hunger Vital Sign    Worried About Running Out of Food in the Last Year: Sometimes true    Ran Out of Food in the Last Year: Often true  Transportation Needs: No Transportation Needs (05/14/2024)   PRAPARE - Administrator, Civil Service (Medical): No    Lack of Transportation (Non-Medical): No  Physical Activity: Not on file  Stress: Not on file  Social Connections: Socially Isolated (05/14/2024)   Social Connection and Isolation Panel    Frequency of Communication with Friends and Family: More than three times a week    Frequency of Social Gatherings with Friends and Family: Once a week    Attends Religious Services: Never    Database Administrator or Organizations: No    Attends Banker Meetings: Never    Marital Status: Divorced  Catering Manager Violence: Not At Risk (05/14/2024)   Humiliation, Afraid, Rape, and Kick questionnaire    Fear of Current or Ex-Partner: No     Emotionally Abused: No    Physically Abused: No    Sexually Abused: No    Family History  Problem Relation Age of Onset   Hypertension Mother    Diabetes Mother    Diabetes Father    Hypertension Father    High Cholesterol Father    Congestive Heart Failure Father    Breast cancer Cousin    No Known Allergies I? Current Facility-Administered Medications  Medication Dose Route Frequency Provider Last Rate Last Admin   acetaminophen  (TYLENOL ) tablet 650 mg  650 mg Oral Q6H PRN Duncan, Hazel V, MD   650 mg at 05/15/24 1737   Or   acetaminophen  (TYLENOL ) suppository 650 mg  650 mg Rectal Q6H PRN Cleatus Delayne GAILS, MD       albuterol  (PROVENTIL ) (2.5 MG/3ML) 0.083% nebulizer solution 2.5 mg  2.5 mg Inhalation Q4H PRN Duncan, Hazel V, MD       enoxaparin  (LOVENOX ) injection 30 mg  30  mg Subcutaneous Q12H Cleatus Delayne GAILS, MD   30 mg at 05/17/24 2156   HYDROcodone -acetaminophen  (NORCO/VICODIN) 5-325 MG per tablet 1-2 tablet  1-2 tablet Oral Q4H PRN Cleatus Delayne GAILS, MD       ketorolac  (TORADOL ) 15 MG/ML injection 15 mg  15 mg Intravenous Q6H PRN Cleatus Delayne GAILS, MD   15 mg at 05/17/24 1735   loperamide  (IMODIUM ) capsule 2 mg  2 mg Oral PRN Cleatus Delayne GAILS, MD       meropenem  (MERREM ) 1 g in sodium chloride  0.9 % 100 mL IVPB  1 g Intravenous Q8H Jacolyn Pae, MD 200 mL/hr at 05/17/24 2200 1 g at 05/17/24 2200   midodrine  (PROAMATINE ) tablet 10 mg  10 mg Oral TID WC Cleatus Delayne GAILS, MD   10 mg at 05/17/24 1735   ondansetron  (ZOFRAN ) tablet 4 mg  4 mg Oral Q6H PRN Cleatus Delayne GAILS, MD       Or   ondansetron  (ZOFRAN ) injection 4 mg  4 mg Intravenous Q6H PRN Cleatus Delayne GAILS, MD       pantoprazole  (PROTONIX ) EC tablet 40 mg  40 mg Oral BID Cleatus Delayne GAILS, MD   40 mg at 05/17/24 2156   rosuvastatin  (CRESTOR ) tablet 10 mg  10 mg Oral Daily Cleatus Delayne GAILS, MD   10 mg at 05/17/24 0800   simethicone  (MYLICON) chewable tablet 80 mg  80 mg Oral Q6H PRN Cleatus Delayne GAILS, MD       vancomycin   (VANCOREADY) IVPB 750 mg/150 mL  750 mg Intravenous Q24H Nada Adriana BIRCH, RPH 150 mL/hr at 05/17/24 1739 750 mg at 05/17/24 1739     Abtx:  Anti-infectives (From admission, onward)    Start     Dose/Rate Route Frequency Ordered Stop   05/17/24 1400  vancomycin  (VANCOCIN ) IVPB 1000 mg/200 mL premix  Status:  Discontinued        1,000 mg 200 mL/hr over 60 Minutes Intravenous Every 48 hours 05/15/24 1720 05/16/24 1230   05/16/24 1800  vancomycin  (VANCOREADY) IVPB 750 mg/150 mL        750 mg 150 mL/hr over 60 Minutes Intravenous Every 24 hours 05/16/24 1230     05/15/24 1800  vancomycin  (VANCOREADY) IVPB 2000 mg/400 mL        2,000 mg 200 mL/hr over 120 Minutes Intravenous NOW 05/15/24 1709 05/15/24 1941   05/14/24 2200  meropenem  (MERREM ) 1 g in sodium chloride  0.9 % 100 mL IVPB        1 g 200 mL/hr over 30 Minutes Intravenous Every 8 hours 05/14/24 2134         REVIEW OF SYSTEMS:  Const:  fever, chills, negative weight loss Eyes: negative diplopia or visual changes, negative eye pain ENT: negative coryza, negative sore throat Resp: negative cough, hemoptysis, dyspnea Cards: negative for chest pain, palpitations, lower extremity edema GU: frequency, dysuria  GI: Negative for abdominal pain, diarrhea, bleeding, constipation Skin: negative for rash and pruritus Heme: negative for easy bruising  MS: weakness Neurolo:negative for headaches, dizziness, vertigo, memory problems  Psych: anxiety, depression  Endocrine: negative for thyroid, diabetes Allergy/Immunology- negative for any medication or food allergies ?  Objective:  VITALS:  BP (!) 120/35 (BP Location: Right Arm)   Pulse (!) 54   Temp 98.6 F (37 C) (Oral)   Resp 16   Ht 4' 11 (1.499 m)   Wt 91.8 kg   SpO2 97%   BMI 40.88 kg/m   PHYSICAL EXAM:  General:  Alert, cooperative, no distress, appears stated age.  Head: Normocephalic, without obvious abnormality, atraumatic. Eyes: Conjunctivae clear, anicteric  sclerae. Pupils are equal ENT Nares normal. No drainage or sinus tenderness. Lips, mucosa, and tongue normal. No Thrush Neck: Supple, symmetrical, no adenopathy, thyroid: non tender no carotid bruit and no JVD. Back: Left flank tenderness Lungs: Clear to auscultation bilaterally. No Wheezing or Rhonchi. No rales. Heart: Regular rate and rhythm, no murmur, rub or gallop. Abdomen: Soft, non-tender,not distended. Bowel sounds normal. No masses Extremities: Bilateral lymphedema Skin: No rashes or lesions. Or bruising Lymph: Cervical, supraclavicular normal. Neurologic: Grossly non-focal Pertinent Labs Lab Results CBC    Component Value Date/Time   WBC 4.6 05/17/2024 0557   RBC 3.30 (L) 05/17/2024 0557   HGB 8.4 (L) 05/17/2024 0557   HGB 9.8 (L) 03/24/2023 1351   HCT 28.1 (L) 05/17/2024 0557   PLT 244 05/17/2024 0557   PLT 256 03/24/2023 1351   MCV 85.2 05/17/2024 0557   MCH 25.5 (L) 05/17/2024 0557   MCHC 29.9 (L) 05/17/2024 0557   RDW 18.2 (H) 05/17/2024 0557   LYMPHSABS 0.7 05/14/2024 1924   MONOABS 0.3 05/14/2024 1924   EOSABS 0.0 05/14/2024 1924   BASOSABS 0.0 05/14/2024 1924       Latest Ref Rng & Units 05/17/2024    5:57 AM 05/16/2024    6:13 AM 05/15/2024    4:44 AM  CMP  Glucose 70 - 99 mg/dL 93  92  870   BUN 8 - 23 mg/dL 18  19  19    Creatinine 0.44 - 1.00 mg/dL 9.07  8.90  9.02   Sodium 135 - 145 mmol/L 140  138  137   Potassium 3.5 - 5.1 mmol/L 3.9  3.6  4.0   Chloride 98 - 111 mmol/L 110  108  105   CO2 22 - 32 mmol/L 21  22  22    Calcium  8.9 - 10.3 mg/dL 8.1  7.9  8.3       Microbiology: Recent Results (from the past 240 hours)  Blood Culture (routine x 2)     Status: None (Preliminary result)   Collection Time: 05/14/24  7:24 PM   Specimen: BLOOD  Result Value Ref Range Status   Specimen Description BLOOD BLOOD LEFT ARM  Final   Special Requests   Final    BOTTLES DRAWN AEROBIC AND ANAEROBIC Blood Culture results may not be optimal due to an  inadequate volume of blood received in culture bottles   Culture   Final    NO GROWTH 3 DAYS Performed at [facility] [address] [place], [place] [address]    Report Status PENDING  Incomplete  Blood Culture (routine x 2)     Status: Abnormal   Collection Time: 05/14/24  7:24 PM   Specimen: BLOOD RIGHT HAND  Result Value Ref Range Status   Specimen Description   Final    BLOOD RIGHT HAND Performed at [facility], [address], [place], [place] [address]    Special Requests   Final    BOTTLES DRAWN AEROBIC AND ANAEROBIC Blood Culture adequate volume Performed at [facility], [address] [place], [place] [address]    Culture  Setup Time   Final    GRAM POSITIVE COCCI IN BOTH AEROBIC AND ANAEROBIC BOTTLES CRITICAL RESULT CALLED TO, READ BACK BY AND VERIFIED WITH:  RAQUEL RODRIGUEZ GUZMAN, PHARM D AT 1703 05/15/24 RAM    Culture (A)  Final  STAPHYLOCOCCUS EPIDERMIDIS THE SIGNIFICANCE OF ISOLATING THIS ORGANISM FROM A SINGLE SET OF BLOOD CULTURES WHEN MULTIPLE SETS ARE DRAWN IS UNCERTAIN. PLEASE NOTIFY THE MICROBIOLOGY DEPARTMENT WITHIN ONE WEEK IF SPECIATION AND SENSITIVITIES ARE REQUIRED. Performed at [facility], [address] [place], [place] [address]    Report Status 05/17/2024 FINAL  Final  Blood Culture ID Panel (Reflexed)     Status: Abnormal   Collection Time: 05/14/24  7:24 PM  Result Value Ref Range Status   Enterococcus faecalis NOT DETECTED NOT DETECTED Final   Enterococcus Faecium NOT DETECTED NOT DETECTED Final   Listeria monocytogenes NOT DETECTED NOT DETECTED Final   Staphylococcus species DETECTED (A) NOT DETECTED Final    Comment: CRITICAL RESULT CALLED TO, READ BACK BY AND VERIFIED WITH: RAQUEL RODRIGUEZ GUZMAN, PHARM D AT 1703 05/15/24 RAM    Staphylococcus aureus (BCID) NOT DETECTED NOT DETECTED Final   Staphylococcus epidermidis DETECTED (A) NOT DETECTED Final    Comment: Methicillin (oxacillin) resistant  coagulase negative staphylococcus. Possible blood culture contaminant (unless isolated from more than one blood culture draw or clinical case suggests pathogenicity). No antibiotic treatment is indicated for blood  culture contaminants. RAQUEL RODRIGUEZ GUZMAN, PHARM D AT 1703 05/15/24 RAM    Staphylococcus lugdunensis NOT DETECTED NOT DETECTED Final   Streptococcus species NOT DETECTED NOT DETECTED Final   Streptococcus agalactiae NOT DETECTED NOT DETECTED Final   Streptococcus pneumoniae NOT DETECTED NOT DETECTED Final   Streptococcus pyogenes NOT DETECTED NOT DETECTED Final   A.calcoaceticus-baumannii NOT DETECTED NOT DETECTED Final   Bacteroides fragilis NOT DETECTED NOT DETECTED Final   Enterobacterales NOT DETECTED NOT DETECTED Final   Enterobacter cloacae complex NOT DETECTED NOT DETECTED Final   Escherichia coli NOT DETECTED NOT DETECTED Final   Klebsiella aerogenes NOT DETECTED NOT DETECTED Final   Klebsiella oxytoca NOT DETECTED NOT DETECTED Final   Klebsiella pneumoniae NOT DETECTED NOT DETECTED Final   Proteus species NOT DETECTED NOT DETECTED Final   Salmonella species NOT DETECTED NOT DETECTED Final   Serratia marcescens NOT DETECTED NOT DETECTED Final   Haemophilus influenzae NOT DETECTED NOT DETECTED Final   Neisseria meningitidis NOT DETECTED NOT DETECTED Final   Pseudomonas aeruginosa NOT DETECTED NOT DETECTED Final   Stenotrophomonas maltophilia NOT DETECTED NOT DETECTED Final   Candida albicans NOT DETECTED NOT DETECTED Final   Candida auris NOT DETECTED NOT DETECTED Final   Candida glabrata NOT DETECTED NOT DETECTED Final   Candida krusei NOT DETECTED NOT DETECTED Final   Candida parapsilosis NOT DETECTED NOT DETECTED Final   Candida tropicalis NOT DETECTED NOT DETECTED Final   Cryptococcus neoformans/gattii NOT DETECTED NOT DETECTED Final   Methicillin resistance mecA/C DETECTED (A) NOT DETECTED Final    CommentBETHA LILIA EVERN ELIZA BERDINE D AT 1703 05/15/24  RAM Performed at [facility], [address] [place], [place] [address]   Resp panel by RT-PCR (RSV, Flu A&B, Covid) Anterior Nasal Swab     Status: None   Collection Time: 05/14/24  7:47 PM   Specimen: Anterior Nasal Swab  Result Value Ref Range Status   SARS Coronavirus 2 by RT PCR NEGATIVE NEGATIVE Final    Comment: (NOTE) SARS-CoV-2 target nucleic acids are NOT DETECTED.  The SARS-CoV-2 RNA is generally detectable in upper respiratory specimens during the acute phase of infection. The lowest concentration of SARS-CoV-2 viral copies this assay can detect is 138 copies/mL. A negative result does not preclude SARS-Cov-2 infection and should not be used as the sole basis  for treatment or other patient management decisions. A negative result may occur with  improper specimen collection/handling, submission of specimen other than nasopharyngeal swab, presence of viral mutation(s) within the areas targeted by this assay, and inadequate number of viral copies(<138 copies/mL). A negative result must be combined with clinical observations, patient history, and epidemiological information. The expected result is Negative.  Fact Sheet for Patients:  bloggercourse.com  Fact Sheet for Healthcare Providers:  seriousbroker.it  This test is no t yet approved or cleared by the [place]  FDA and  has been authorized for detection and/or diagnosis of SARS-CoV-2 by FDA under an Emergency Use Authorization (EUA). This EUA will remain  in effect (meaning this test can be used) for the duration of the COVID-19 declaration under Section 564(b)(1) of the Act, 21 U.S.C.section 360bbb-3(b)(1), unless the authorization is terminated  or revoked sooner.       Influenza A by PCR NEGATIVE NEGATIVE Final   Influenza B by PCR NEGATIVE NEGATIVE Final    Comment: (NOTE) The Xpert Xpress SARS-CoV-2/FLU/RSV plus assay is intended as an aid in  the diagnosis of influenza from Nasopharyngeal swab specimens and should not be used as a sole basis for treatment. Nasal washings and aspirates are unacceptable for Xpert Xpress SARS-CoV-2/FLU/RSV testing.  Fact Sheet for Patients: bloggercourse.com  Fact Sheet for Healthcare Providers: seriousbroker.it  This test is not yet approved or cleared by the [place]  FDA and has been authorized for detection and/or diagnosis of SARS-CoV-2 by FDA under an Emergency Use Authorization (EUA). This EUA will remain in effect (meaning this test can be used) for the duration of the COVID-19 declaration under Section 564(b)(1) of the Act, 21 U.S.C. section 360bbb-3(b)(1), unless the authorization is terminated or revoked.     Resp Syncytial Virus by PCR NEGATIVE NEGATIVE Final    Comment: (NOTE) Fact Sheet for Patients: bloggercourse.com  Fact Sheet for Healthcare Providers: seriousbroker.it  This test is not yet approved or cleared by the [place]  FDA and has been authorized for detection and/or diagnosis of SARS-CoV-2 by FDA under an Emergency Use Authorization (EUA). This EUA will remain in effect (meaning this test can be used) for the duration of the COVID-19 declaration under Section 564(b)(1) of the Act, 21 U.S.C. section 360bbb-3(b)(1), unless the authorization is terminated or revoked.  Performed at [facility], [address] [place], [place] [address]   Urine Culture (for pregnant, neutropenic or urologic patients or patients with an indwelling urinary catheter)     Status: Abnormal   Collection Time: 05/15/24 11:40 AM   Specimen: Urine, Clean Catch  Result Value Ref Range Status   Specimen Description   Final    URINE, CLEAN CATCH Performed at [facility], [address], [place], [place] [address]    Special Requests   Final     NONE Performed at [facility], [address], [place], [place] [address]    Culture (A)  Final    <10,000 COLONIES/mL INSIGNIFICANT GROWTH Performed at [facility], [address] [place], [place] [address]    Report Status 05/16/2024 FINAL  Final  Culture, blood (Routine X 2) w Reflex to ID Panel     Status: None (Preliminary result)   Collection Time: 05/16/24 10:50 AM   Specimen: BLOOD  Result Value Ref Range Status   Specimen Description BLOOD BLOOD LEFT HAND  Final   Special Requests   Final    BOTTLES DRAWN AEROBIC AND ANAEROBIC Blood Culture  adequate volume   Culture   Final    NO GROWTH < 24 HOURS Performed at [facility], [address] [place], [place] [address]    Report Status PENDING  Incomplete  Culture, blood (Routine X 2) w Reflex to ID Panel     Status: None (Preliminary result)   Collection Time: 05/16/24 11:03 AM   Specimen: BLOOD LEFT HAND  Result Value Ref Range Status   Specimen Description BLOOD LEFT HAND  Final   Special Requests Blood Culture adequate volume  Final   Culture   Final    NO GROWTH < 24 HOURS Performed at [facility], [address], [place], [place] [address]    Report Status PENDING  Incomplete    Lines and Device Date on insertion # of days DC  Central line     Foley     ETT       IMAGING RESULTS: I have personally reviewed the films ? Impression/Recommendation Staph epidermidis bacteremia 1 out of 4 this is contaminated and need not be treated.  Discontinue vancomycin   Fever secondary to complicated UTI / left hydronephrosis secondary to kinked nephroureteral  tube.  Once that was uncapped there was good flow of urine The left nephrostomy tube has been exchanged Okay to continue with meropenem  for a total of 7 days I have asked lab to identify any organism in the urine  History of endometrial carcinoma status post radiation H/o  radiation cystitis ? History of GAVE Anemia  hemoglobin of 8.8  History of DVT This consult involved complex antimicrobial management Discussed the management with the patient in great detail Discussed with the primary team and ID pharmacist   ________________________________________________  Note:  This document was prepared using Dragon voice recognition software and may include unintentional dictation errors.

## 2024-05-18 NOTE — Progress Notes (Addendum)
 Progress Note    Courtney Mack  FMW:[id number] DOB: Dec 28, 1955  DOA: 05/14/2024 PCP: [facility]      Brief Narrative:    Medical records reviewed and are as summarized below:  Courtney Mack is a 68 y.o. female with medical history significant for nephrostomy secondary to chronic left-sided hydronephrosis, frequent UTIs requiring hospitalization most recently for ESBL E. coli 04/06/2024, on prophylactic Bactrim  since February 2025, hypertension, COPD, morbid obesity, PE and DVT s/p IVC filter on Lovenox  shots, endometrial cancer, history of GI bleed, chronic HFpEF.  She presented to the hospital because of fever (temperature 101.2 F) and left flank pain.  In the ED, temperature was 100.3 F, heart rate 100, respiratory rate 22, BP 131/45, O2 saturation 94% on room air.  CT abdomen and pelvis with contrast IMPRESSION: 1. Left-sided hydronephrosis with considerable perinephric stranding, new from the prior exam, with nephroureteral catheter in satisfactory position; the decompressed urinary bladder raises suspicion for catheter malfunction or kinking. 2. Right renal calculus measuring approximately 6 mm without obstructive changes.   Dr. Jacolyn, the ED physician, had spoken with Dr. Carolee, urologist on-call, who recommended uncapping the nephrostomy tube and placing a bag to allow drainage in case of blockage of the nephroureteral catheter.  IR was also consulted for exchange of nephrostomy tube.  Patient was started on IV meropenem  because of recent ESBL E. coli.  UTI.   Assessment/Plan:   Principal Problem:   Complicated UTI (urinary tract infection) Active Problems:   Acute pyelonephritis   History of infection due to ESBL Escherichia coli   Hydronephrosis of left kidney   COPD (chronic obstructive pulmonary disease) (HCC)   Blocked nephrostomy tube with hydronephrosis and hydroureter (HCC)   Chronic diastolic CHF (congestive heart  failure) (HCC)   HTN (hypertension)   Endometrial cancer (HCC)   History of pulmonary embolism/DVT s/p IVC filter   Obesity, Class III, BMI 40-49.9 (morbid obesity) (HCC)    Body mass index is 40.88 kg/m.  (Class III obesity)   Sepsis secondary to acute pyelonephritis, recent ESBL E. coli UTI: Continue IV meropenem .  Urine culture on this patient showed insignificant growth. ESBL E. coli isolated from urine culture from 04/06/2024.  Chart review shows that she was discharged on IV meropenem  until 04/16/2024.  Staph epidermidis bacteremia: Patient evaluated by Dr. Fayette, ID specialist.  This is thought to be a contaminant.  IV vancomycin  has been discontinued.    Follow-up with ID for further recommendations   Left hydronephrosis, history of left nephrostomy tube/nephroureteral catheter: S/p left nephrostomy tube exchange by IR on 05/17/2024. Patient said that nephrostomy tube and left nephroureteral catheter has been in place since August 2025.   COPD: Continue bronchodilators   Chronic diastolic CHF: Compensated.   Comorbidities include hypertension, hyperlipidemia, history of PE and DVT s/p IVC filter, history of endometrial cancer   Diet Order             Diet regular Fluid consistency: Thin  Diet effective now                                  Consultants: Interventional radiologist Neurologist ID specialist  Procedures: Plan for left nephrostomy tube exchange    Medications:    enoxaparin   30 mg Subcutaneous Q12H   midodrine   10 mg Oral TID WC   pantoprazole   40 mg Oral BID   rosuvastatin   10 mg Oral Daily   Continuous Infusions:  meropenem  (MERREM ) IV 1 g (05/18/24 1417)   vancomycin  750 mg (05/17/24 1739)     Anti-infectives (From admission, onward)    Start     Dose/Rate Route Frequency Ordered Stop   05/17/24 1400  vancomycin  (VANCOCIN ) IVPB 1000 mg/200 mL premix  Status:  Discontinued        1,000 mg 200 mL/hr over  60 Minutes Intravenous Every 48 hours 05/15/24 1720 05/16/24 1230   05/16/24 1800  vancomycin  (VANCOREADY) IVPB 750 mg/150 mL        750 mg 150 mL/hr over 60 Minutes Intravenous Every 24 hours 05/16/24 1230     05/15/24 1800  vancomycin  (VANCOREADY) IVPB 2000 mg/400 mL        2,000 mg 200 mL/hr over 120 Minutes Intravenous NOW 05/15/24 1709 05/15/24 1941   05/14/24 2200  meropenem  (MERREM ) 1 g in sodium chloride  0.9 % 100 mL IVPB        1 g 200 mL/hr over 30 Minutes Intravenous Every 8 hours 05/14/24 2134                Family Communication/Anticipated D/C date and plan/Code Status   DVT prophylaxis: enoxaparin  (LOVENOX ) injection 30 mg Start: 05/15/24 0800     Code Status: Full Code  Family Communication: None Disposition Plan: Discharge home   Status is: Inpatient Remains inpatient appropriate because: Sepsis from pyelonephritis, Staph epidermidis bacteremia, left hydronephrosis         Subjective:   Interval events noted.  She complains of left flank pain.  No other complaints.  Objective:    Vitals:   05/17/24 1952 05/18/24 0554 05/18/24 0822 05/18/24 1611  BP: (!) 120/35 (!) 117/44 (!) 132/46 (!) 140/52  Pulse: (!) 54 72 66 (!) 58  Resp: 16 18 19 16   Temp: 98.6 F (37 C) 98.6 F (37 C) 99.5 F (37.5 C) 98.8 F (37.1 C)  TempSrc: Oral Oral Oral Oral  SpO2: 97% 96% 96% 96%  Weight:      Height:       No data found.   Intake/Output Summary (Last 24 hours) at 05/18/2024 1630 Last data filed at 05/18/2024 1300 Gross per 24 hour  Intake 640 ml  Output 400 ml  Net 240 ml   Filed Weights   05/14/24 2339  Weight: 91.8 kg    Exam:  GEN: NAD SKIN: Warm and dry EYES: No pallor or icterus ENT: MMM CV: RRR PULM: CTA B ABD: soft, ND, NT, +BS CNS: AAO x 3, non focal EXT: No edema or tenderness GU: New left nephrostomy tube with amber urine in the urine bag.      Data Reviewed:   I have personally reviewed following labs and imaging  studies:  Labs: Labs show the following:   Basic Metabolic Panel: Recent Labs  Lab 05/14/24 1924 05/15/24 0444 05/16/24 0613 05/17/24 0557  NA 136 137 138 140  K 4.1 4.0 3.6 3.9  CL 106 105 108 110  CO2 21* 22 22 21*  GLUCOSE 160* 129* 92 93  BUN 19 19 19 18   CREATININE 0.91 0.97 1.09* 0.92  CALCIUM  8.9 8.3* 7.9* 8.1*   GFR Estimated Creatinine Clearance: 57 mL/min (by C-G formula based on SCr of 0.92 mg/dL). Liver Function Tests: Recent Labs  Lab 05/14/24 1924  AST 25  ALT 11  ALKPHOS 55  BILITOT 1.0  PROT 7.3  ALBUMIN 3.1*   No results for input(s): LIPASE, AMYLASE  in the last 168 hours. No results for input(s): AMMONIA in the last 168 hours. Coagulation profile No results for input(s): INR, PROTIME in the last 168 hours.  CBC: Recent Labs  Lab 05/14/24 1924 05/15/24 0444 05/16/24 0613 05/17/24 0557  WBC 10.0 11.3* 5.8 4.6  NEUTROABS 8.9*  --   --   --   HGB 11.0* 9.5* 8.3* 8.4*  HCT 35.7* 31.2* 28.1* 28.1*  MCV 85.0 84.1 88.6 85.2  PLT 261 254 209 244   Cardiac Enzymes: No results for input(s): CKTOTAL, CKMB, CKMBINDEX, TROPONINI in the last 168 hours. BNP (last 3 results) No results for input(s): PROBNP in the last 8760 hours. CBG: No results for input(s): GLUCAP in the last 168 hours. D-Dimer: No results for input(s): DDIMER in the last 72 hours. Hgb A1c: No results for input(s): HGBA1C in the last 72 hours. Lipid Profile: No results for input(s): CHOL, HDL, LDLCALC, TRIG, CHOLHDL, LDLDIRECT in the last 72 hours. Thyroid function studies: No results for input(s): TSH, T4TOTAL, T3FREE, THYROIDAB in the last 72 hours.  Invalid input(s): FREET3 Anemia work up: No results for input(s): VITAMINB12, FOLATE, FERRITIN, TIBC, IRON , RETICCTPCT in the last 72 hours. Sepsis Labs: Recent Labs  Lab 05/14/24 1924 05/15/24 0444 05/16/24 0613 05/17/24 0557  WBC 10.0 11.3* 5.8 4.6   LATICACIDVEN 1.0  --   --   --     Microbiology Recent Results (from the past 240 hours)  Blood Culture (routine x 2)     Status: None (Preliminary result)   Collection Time: 05/14/24  7:24 PM   Specimen: BLOOD  Result Value Ref Range Status   Specimen Description BLOOD BLOOD LEFT ARM  Final   Special Requests   Final    BOTTLES DRAWN AEROBIC AND ANAEROBIC Blood Culture results may not be optimal due to an inadequate volume of blood received in culture bottles   Culture   Final    NO GROWTH 4 DAYS Performed at [facility], [address] [place], [place] [address]    Report Status PENDING  Incomplete  Blood Culture (routine x 2)     Status: Abnormal   Collection Time: 05/14/24  7:24 PM   Specimen: BLOOD RIGHT HAND  Result Value Ref Range Status   Specimen Description   Final    BLOOD RIGHT HAND Performed at [facility], [address] [place], [place] [address]    Special Requests   Final    BOTTLES DRAWN AEROBIC AND ANAEROBIC Blood Culture adequate volume Performed at [facility], [address] [place], [place] [address]    Culture  Setup Time   Final    GRAM POSITIVE COCCI IN BOTH AEROBIC AND ANAEROBIC BOTTLES CRITICAL RESULT CALLED TO, READ BACK BY AND VERIFIED WITH:  RAQUEL RODRIGUEZ GUZMAN, PHARM D AT 1703 05/15/24 RAM    Culture (A)  Final    STAPHYLOCOCCUS EPIDERMIDIS THE SIGNIFICANCE OF ISOLATING THIS ORGANISM FROM A SINGLE SET OF BLOOD CULTURES WHEN MULTIPLE SETS ARE DRAWN IS UNCERTAIN. PLEASE NOTIFY THE MICROBIOLOGY DEPARTMENT WITHIN ONE WEEK IF SPECIATION AND SENSITIVITIES ARE REQUIRED. Performed at [facility], [address] [place], [place] [address]    Report Status 05/17/2024 FINAL  Final  Blood Culture ID Panel (Reflexed)     Status: Abnormal   Collection Time: 05/14/24  7:24 PM  Result Value Ref Range Status   Enterococcus faecalis NOT DETECTED NOT DETECTED Final   Enterococcus Faecium NOT DETECTED NOT DETECTED  Final   Listeria monocytogenes NOT DETECTED NOT DETECTED Final   Staphylococcus species DETECTED (A) NOT DETECTED Final    Comment: CRITICAL RESULT CALLED TO, READ BACK BY AND VERIFIED WITH: RAQUEL RODRIGUEZ GUZMAN, PHARM D AT 1703 05/15/24 RAM    Staphylococcus aureus (BCID) NOT DETECTED NOT DETECTED Final   Staphylococcus epidermidis DETECTED (A) NOT DETECTED Final    Comment: Methicillin (oxacillin) resistant coagulase negative staphylococcus. Possible blood culture contaminant (unless isolated from more than one blood culture draw or clinical case suggests pathogenicity). No antibiotic treatment is indicated for blood  culture contaminants. RAQUEL RODRIGUEZ GUZMAN, PHARM D AT 1703 05/15/24 RAM    Staphylococcus lugdunensis NOT DETECTED NOT DETECTED Final   Streptococcus species NOT DETECTED NOT DETECTED Final   Streptococcus agalactiae NOT DETECTED NOT DETECTED Final   Streptococcus pneumoniae NOT DETECTED NOT DETECTED Final   Streptococcus pyogenes NOT DETECTED NOT DETECTED Final   A.calcoaceticus-baumannii NOT DETECTED NOT DETECTED Final   Bacteroides fragilis NOT DETECTED NOT DETECTED Final   Enterobacterales NOT DETECTED NOT DETECTED Final   Enterobacter cloacae complex NOT DETECTED NOT DETECTED Final   Escherichia coli NOT DETECTED NOT DETECTED Final   Klebsiella aerogenes NOT DETECTED NOT DETECTED Final   Klebsiella oxytoca NOT DETECTED NOT DETECTED Final   Klebsiella pneumoniae NOT DETECTED NOT DETECTED Final   Proteus species NOT DETECTED NOT DETECTED Final   Salmonella species NOT DETECTED NOT DETECTED Final   Serratia marcescens NOT DETECTED NOT DETECTED Final   Haemophilus influenzae NOT DETECTED NOT DETECTED Final   Neisseria meningitidis NOT DETECTED NOT DETECTED Final   Pseudomonas aeruginosa NOT DETECTED NOT DETECTED Final   Stenotrophomonas maltophilia NOT DETECTED NOT DETECTED Final   Candida albicans NOT DETECTED NOT DETECTED Final   Candida auris NOT DETECTED NOT  DETECTED Final   Candida glabrata NOT DETECTED NOT DETECTED Final   Candida krusei NOT DETECTED NOT DETECTED Final   Candida parapsilosis NOT DETECTED NOT DETECTED Final   Candida tropicalis NOT DETECTED NOT DETECTED Final   Cryptococcus neoformans/gattii NOT DETECTED NOT DETECTED Final   Methicillin resistance mecA/C DETECTED (A) NOT DETECTED Final    CommentBETHA LILIA EVERN ELIZA BERDINE D AT 1703 05/15/24 RAM Performed at [facility], [address] [place], [place] [address]   Resp panel by RT-PCR (RSV, Flu A&B, Covid) Anterior Nasal Swab     Status: None   Collection Time: 05/14/24  7:47 PM   Specimen: Anterior Nasal Swab  Result Value Ref Range Status   SARS Coronavirus 2 by RT PCR NEGATIVE NEGATIVE Final    Comment: (NOTE) SARS-CoV-2 target nucleic acids are NOT DETECTED.  The SARS-CoV-2 RNA is generally detectable in upper respiratory specimens during the acute phase of infection. The lowest concentration of SARS-CoV-2 viral copies this assay can detect is 138 copies/mL. A negative result does not preclude SARS-Cov-2 infection and should not be used as the sole basis for treatment or other patient management decisions. A negative result may occur with  improper specimen collection/handling, submission of specimen other than nasopharyngeal swab, presence of viral mutation(s) within the areas targeted by this assay, and inadequate number of viral copies(<138 copies/mL). A negative result must be combined with clinical observations, patient history, and epidemiological information. The expected result is Negative.  Fact Sheet for Patients:  bloggercourse.com  Fact Sheet for Healthcare Providers:  seriousbroker.it  This test is no t yet approved or cleared by the [place]  FDA and  has been authorized for detection and/or diagnosis of SARS-CoV-2  by FDA under an Emergency Use Authorization (EUA). This EUA will  remain  in effect (meaning this test can be used) for the duration of the COVID-19 declaration under Section 564(b)(1) of the Act, 21 U.S.C.section 360bbb-3(b)(1), unless the authorization is terminated  or revoked sooner.       Influenza A by PCR NEGATIVE NEGATIVE Final   Influenza B by PCR NEGATIVE NEGATIVE Final    Comment: (NOTE) The Xpert Xpress SARS-CoV-2/FLU/RSV plus assay is intended as an aid in the diagnosis of influenza from Nasopharyngeal swab specimens and should not be used as a sole basis for treatment. Nasal washings and aspirates are unacceptable for Xpert Xpress SARS-CoV-2/FLU/RSV testing.  Fact Sheet for Patients: bloggercourse.com  Fact Sheet for Healthcare Providers: seriousbroker.it  This test is not yet approved or cleared by the [place]  FDA and has been authorized for detection and/or diagnosis of SARS-CoV-2 by FDA under an Emergency Use Authorization (EUA). This EUA will remain in effect (meaning this test can be used) for the duration of the COVID-19 declaration under Section 564(b)(1) of the Act, 21 U.S.C. section 360bbb-3(b)(1), unless the authorization is terminated or revoked.     Resp Syncytial Virus by PCR NEGATIVE NEGATIVE Final    Comment: (NOTE) Fact Sheet for Patients: bloggercourse.com  Fact Sheet for Healthcare Providers: seriousbroker.it  This test is not yet approved or cleared by the [place]  FDA and has been authorized for detection and/or diagnosis of SARS-CoV-2 by FDA under an Emergency Use Authorization (EUA). This EUA will remain in effect (meaning this test can be used) for the duration of the COVID-19 declaration under Section 564(b)(1) of the Act, 21 U.S.C. section 360bbb-3(b)(1), unless the authorization is terminated or revoked.  Performed at [facility], [address] [place], [place] [address]    Urine Culture (for pregnant, neutropenic or urologic patients or patients with an indwelling urinary catheter)     Status: Abnormal (Preliminary result)   Collection Time: 05/15/24 11:40 AM   Specimen: Urine, Clean Catch  Result Value Ref Range Status   Specimen Description   Final    URINE, CLEAN CATCH Performed at [facility], [address] [place], [place] [address]    Special Requests   Final    NONE Performed at [facility], [address], [place], [place] [address]    Culture (A)  Final    <10,000 COLONIES/mL INSIGNIFICANT GROWTH Performed at [facility], [address] [place], [place] [address]    Report Status PENDING  Incomplete  Culture, blood (Routine X 2) w Reflex to ID Panel     Status: None (Preliminary result)   Collection Time: 05/16/24 10:50 AM   Specimen: BLOOD  Result Value Ref Range Status   Specimen Description BLOOD BLOOD LEFT HAND  Final   Special Requests   Final    BOTTLES DRAWN AEROBIC AND ANAEROBIC Blood Culture adequate volume   Culture   Final    NO GROWTH 2 DAYS Performed at [facility], [address], [place], [place] [address]    Report Status PENDING  Incomplete  Culture, blood (Routine X 2) w Reflex to ID Panel     Status: None (Preliminary result)   Collection Time: 05/16/24 11:03 AM   Specimen: BLOOD LEFT HAND  Result Value Ref Range Status   Specimen Description BLOOD LEFT HAND  Final   Special Requests Blood Culture adequate volume  Final   Culture   Final  NO GROWTH 2 DAYS Performed at [facility], [address] [place], [place] [address]    Report Status PENDING  Incomplete    Procedures and diagnostic studies:  No results found.             LOS: 1 day   Kannan Proia  [facility]   Pager on [email or web address]. If 7PM-7AM, please contact night-coverage at [email or web address]     05/18/2024, 4:30 PM

## 2024-05-18 NOTE — Progress Notes (Signed)
 Date of Admission:  05/14/2024    ID: Courtney Mack is a 68 y.o. female  Principal Problem:   Complicated UTI (urinary tract infection) Active Problems:   Obesity, Class III, BMI 40-49.9 (morbid obesity) (HCC)   Endometrial cancer (HCC)   Chronic diastolic CHF (congestive heart failure) (HCC)   Hydronephrosis of left kidney   History of pulmonary embolism/DVT s/p IVC filter   COPD (chronic obstructive pulmonary disease) (HCC)   Acute pyelonephritis   Blocked nephrostomy tube with hydronephrosis and hydroureter (HCC)   HTN (hypertension)   History of infection due to ESBL Escherichia coli    Subjective: Doing better Pain flank much improved  Medications:   enoxaparin   30 mg Subcutaneous Q12H   midodrine   10 mg Oral TID WC   pantoprazole   40 mg Oral BID   rosuvastatin   10 mg Oral Daily    Objective: Vital signs in last 24 hours: Patient Vitals for the past 24 hrs:  BP Temp Temp src Pulse Resp SpO2  05/18/24 2147 (!) 141/51 99 F (37.2 C) Oral (!) 58 19 97 %  05/18/24 1611 (!) 140/52 98.8 F (37.1 C) Oral (!) 58 16 96 %  05/18/24 0822 (!) 132/46 99.5 F (37.5 C) Oral 66 19 96 %  05/18/24 0554 (!) 117/44 98.6 F (37 C) Oral 72 18 96 %     PHYSICAL EXAM:  General: Alert, cooperative, no distress, appears stated age.  Head: Normocephalic, without obvious abnormality, atraumatic. Eyes: Conjunctivae clear, anicteric sclerae. Pupils are equal ENT Nares normal. No drainage or sinus tenderness. Lips, mucosa, and tongue normal. No Thrush Neck: Supple, symmetrical, no adenopathy, thyroid: non tender no carotid bruit and no JVD. Back: No CVA tenderness. Lungs: Clear to auscultation bilaterally. No Wheezing or Rhonchi. No rales. Heart: Regular rate and rhythm, no murmur, rub or gallop. Abdomen: Soft, non-tender,not distended. Bowel sounds normal. No masses Extremities: atraumatic, no cyanosis. No edema. No clubbing Skin: No rashes or lesions. Or bruising Lymph:  Cervical, supraclavicular normal. Neurologic: Grossly non-focal  Lab Results    Latest Ref Rng & Units 05/17/2024    5:57 AM 05/16/2024    6:13 AM 05/15/2024    4:44 AM  CBC  WBC 4.0 - 10.5 K/uL 4.6  5.8  11.3   Hemoglobin 12.0 - 15.0 g/dL 8.4  8.3  9.5   Hematocrit 36.0 - 46.0 % 28.1  28.1  31.2   Platelets 150 - 400 K/uL 244  209  254        Latest Ref Rng & Units 05/17/2024    5:57 AM 05/16/2024    6:13 AM 05/15/2024    4:44 AM  CMP  Glucose 70 - 99 mg/dL 93  92  870   BUN 8 - 23 mg/dL 18  19  19    Creatinine 0.44 - 1.00 mg/dL 9.07  8.90  9.02   Sodium 135 - 145 mmol/L 140  138  137   Potassium 3.5 - 5.1 mmol/L 3.9  3.6  4.0   Chloride 98 - 111 mmol/L 110  108  105   CO2 22 - 32 mmol/L 21  22  22    Calcium  8.9 - 10.3 mg/dL 8.1  7.9  8.3       Microbiology: 05/14/2024 Blood culture Staph epidermidis  05/16/2024 blood cultures no growth  05/15/24 urine culture less than 10,000 colonies.    Assessment/Plan: Staph epi bacteremia 1 out of 4 bottles contamination Does not need treatment Discontinue Vanco mycin  Fever secondary to complicated UTI is resolved There was left hydronephrosis secondary to kinked nephroureteral tube.  Once that was uncapped there was good urine flow  Left nephrectomy tube has been changed as well Urine culture had more less than 10,000 colonies of 3 different organisms   Not identified Patient is currently on meropenem  will complete 7 days of treatment 05/21/24  History of endometrial carcinoma status post radiation History of radiation cystitis  History of GAVE  Anemia Hemoglobin 8.8  Discussed the management with the patient ID will sign off- call if needed

## 2024-05-19 DIAGNOSIS — N390 Urinary tract infection, site not specified: Secondary | ICD-10-CM | POA: Diagnosis not present

## 2024-05-19 DIAGNOSIS — E66813 Obesity, class 3: Secondary | ICD-10-CM

## 2024-05-19 DIAGNOSIS — N10 Acute tubulo-interstitial nephritis: Secondary | ICD-10-CM | POA: Diagnosis not present

## 2024-05-19 LAB — CULTURE, BLOOD (ROUTINE X 2): Culture: NO GROWTH

## 2024-05-19 MED ORDER — POLYSACCHARIDE IRON COMPLEX 150 MG PO CAPS
150.0000 mg | ORAL_CAPSULE | Freq: Every day | ORAL | Status: DC
  Administered 2024-05-19 – 2024-05-20 (×2): 150 mg via ORAL
  Filled 2024-05-19 (×2): qty 1

## 2024-05-19 MED ORDER — FUROSEMIDE 20 MG PO TABS
20.0000 mg | ORAL_TABLET | Freq: Every day | ORAL | Status: DC
  Administered 2024-05-19 – 2024-05-20 (×2): 20 mg via ORAL
  Filled 2024-05-19 (×2): qty 1

## 2024-05-19 NOTE — Progress Notes (Signed)
 Occupational Therapy Treatment Patient Details Name: ARZU MCGAUGHEY MRN: [id number] DOB: 06/18/1956 Today's Date: 05/19/2024   History of present illness Pt is a 68 y/o F admitted on 05/14/24 after presenting with c/o fever & flank pain. Pt is being treated for acute pyelonephritis, sepsis. PMH: nephrostomy 2/2 chronic L sided hydronephrosis, frequent UTIs, HTN, COPD, morbid obesity, PE/DVT s/p IVC filter, endometrial CA, GI bleed, HFpEF   OT comments  Chart reviewed to date, pt greeted semi supine in bed, agreeable to OT tx session targeting improving functional activity tolerance in prep for ADL tasks. Pt requires MOD-MAX A for bed mobility (sleeps in recliner at baseline), CGA for STS. She performs static standing for approx 3 minutes while MD is in room, she then amb to sink with RW with CGA, performs standing grooming tasks with CGA for approx 3 minutes then amb back to bed with RW with CGA. She reports swelling in BLE is worsened (declines ted hose, said hospital ones are too tight, does not have her home set). Educated on importance of continued mobility attempts during stay to decrease regression of functional status. Vss. Pt is left as reiceved, all needs met. OT will follow.       If plan is discharge home, recommend the following:  A little help with walking and/or transfers;A lot of help with bathing/dressing/bathroom;Assist for transportation;Help with stairs or ramp for entrance   Equipment Recommendations  None recommended by OT    Recommendations for Other Services      Precautions / Restrictions Precautions Precautions: Fall Recall of Precautions/Restrictions: Intact Precaution/Restrictions Comments: L nephrostomy tube Restrictions Weight Bearing Restrictions Per Provider Order: No       Mobility Bed Mobility Overal bed mobility: Needs Assistance Bed Mobility: Supine to Sit, Sit to Supine     Supine to sit: Mod assist, HOB elevated Sit to supine: Max assist,  HOB elevated   General bed mobility comments: assist for BLE- reports edema has worsened, not wearing compression socks she wears at home    Transfers Overall transfer level: Needs assistance Equipment used: Rolling walker (2 wheels) Transfers: Sit to/from Stand Sit to Stand: Contact guard assist                 Balance Overall balance assessment: Needs assistance Sitting-balance support: No upper extremity supported, Feet supported Sitting balance-Leahy Scale: Good     Standing balance support: Bilateral upper extremity supported, Reliant on assistive device for balance, During functional activity Standing balance-Leahy Scale: Fair                             ADL either performed or assessed with clinical judgement   ADL Overall ADL's : Needs assistance/impaired     Grooming: Wash/dry face;Oral care;Standing;Supervision/safety Grooming Details (indicate cue type and reason): at sink level, holding onto counter             Lower Body Dressing: Maximal assistance;Sitting/lateral leans   Toilet Transfer: Contact guard assist;Rolling walker (2 wheels);Ambulation Toilet Transfer Details (indicate cue type and reason): simulated         Functional mobility during ADLs: Contact guard assist;Rolling walker (2 wheels) (approx 30' with standing grooming tasks after approx 15' for approx 3 minutes)      Extremity/Trunk Assessment              Vision       Perception     Praxis     Communication Communication  Communication: No apparent difficulties   Cognition Arousal: Alert Behavior During Therapy: WFL for tasks assessed/performed Cognition: No apparent impairments                               Following commands: Intact        Cueing   Cueing Techniques: Verbal cues  Exercises Other Exercises Other Exercises: edu re role of OT, role of rehab, discharge recommendations (ask TOC for [facility] consult if she feels she is not at  baseline as discharge approaches)    Shoulder Instructions       General Comments HR 80s bpm post mobility, spo2 >90% on RA    Pertinent Vitals/ Pain       Pain Assessment Pain Assessment: 0-10 Pain Score: 5  Pain Location: L flank, nephrostomy tube insertion Pain Descriptors / Indicators: Discomfort Pain Intervention(s): Monitored during session, Repositioned, Patient requesting pain meds-RN notified  Home Living                                          Prior Functioning/Environment              Frequency  Min 2X/week        Progress Toward Goals  OT Goals(current goals can now be found in the care plan section)  Progress towards OT goals: Progressing toward goals  Acute Rehab OT Goals Time For Goal Achievement: 05/31/24  Plan      Co-evaluation                 AM-PAC OT 6 Clicks Daily Activity     Outcome Measure   Help from another person eating meals?: None Help from another person taking care of personal grooming?: A Little Help from another person toileting, which includes using toliet, bedpan, or urinal?: A Lot Help from another person bathing (including washing, rinsing, drying)?: A Lot Help from another person to put on and taking off regular upper body clothing?: None Help from another person to put on and taking off regular lower body clothing?: A Lot 6 Click Score: 17    End of Session Equipment Utilized During Treatment: Rolling walker (2 wheels)  OT Visit Diagnosis: Unsteadiness on feet (R26.81);Other abnormalities of gait and mobility (R26.89);Muscle weakness (generalized) (M62.81)   Activity Tolerance Patient tolerated treatment well   Patient Left in bed;with call bell/phone within reach;with bed alarm set   Nurse Communication Mobility status;Patient requests pain meds        Time: 9066-9044 OT Time Calculation (min): 22 min  Charges: OT General Charges $OT Visit: 1 Visit OT Treatments $Self  Care/Home Management : 8-22 mins  Therisa Sheffield, OTD OTR/L  05/19/24, 11:17 AM

## 2024-05-19 NOTE — Hospital Course (Addendum)
 Courtney Mack is a 68 y.o. female with medical history significant for nephrostomy secondary to chronic left-sided hydronephrosis, frequent UTIs requiring hospitalization most recently for ESBL E. coli 04/06/2024, on prophylactic Bactrim  since February 2025, hypertension, COPD, morbid obesity, PE and DVT s/p IVC filter on Lovenox  shots, endometrial cancer, history of GI bleed, chronic HFpEF.  She presented to the hospital because of fever (temperature 101.2 F) and left flank pain.   In the ED, temperature was 100.3 F, heart rate 100, respiratory rate 22, BP 131/45, O2 saturation 94% on room air.   CT abdomen and pelvis with contrast IMPRESSION: 1. Left-sided hydronephrosis with considerable perinephric stranding, new from the prior exam, with nephroureteral catheter in satisfactory position; the decompressed urinary bladder raises suspicion for catheter malfunction or kinking. 2. Right renal calculus measuring approximately 6 mm without obstructive changes.     Dr. Jacolyn, the ED physician, had spoken with Dr. Carolee, urologist on-call, who recommended uncapping the nephrostomy tube and placing a bag to allow drainage in case of blockage of the nephroureteral catheter.  IR at the replaced nephrostomy tube on 11/11.   Patient was started on IV meropenem  because of recent ESBL E. coli.  UTI, which will be completed by 11/14. Patient condition has improved, will give her final dose of ertapenem before discharge.

## 2024-05-19 NOTE — Progress Notes (Signed)
 Progress Note   Patient: Courtney Mack FMW:[id number] DOB: [birth year] DOA: 05/14/2024     2 DOS: the patient was seen and examined on 05/19/2024   Brief hospital course: KURA BETHARDS is a 68 y.o. female with medical history significant for nephrostomy secondary to chronic left-sided hydronephrosis, frequent UTIs requiring hospitalization most recently for ESBL E. coli 04/06/2024, on prophylactic Bactrim  since February 2025, hypertension, COPD, morbid obesity, PE and DVT s/p IVC filter on Lovenox  shots, endometrial cancer, history of GI bleed, chronic HFpEF.  She presented to the hospital because of fever (temperature 101.2 F) and left flank pain.   In the ED, temperature was 100.3 F, heart rate 100, respiratory rate 22, BP 131/45, O2 saturation 94% on room air.   CT abdomen and pelvis with contrast IMPRESSION: 1. Left-sided hydronephrosis with considerable perinephric stranding, new from the prior exam, with nephroureteral catheter in satisfactory position; the decompressed urinary bladder raises suspicion for catheter malfunction or kinking. 2. Right renal calculus measuring approximately 6 mm without obstructive changes.     Dr. Jacolyn, the ED physician, had spoken with Dr. Carolee, urologist on-call, who recommended uncapping the nephrostomy tube and placing a bag to allow drainage in case of blockage of the nephroureteral catheter.  IR at the replaced nephrostomy tube on 11/11.   Patient was started on IV meropenem  because of recent ESBL E. coli.  UTI, which will be completed by 11/14.   Principal Problem:   Complicated UTI (urinary tract infection) Active Problems:   Acute pyelonephritis   History of infection due to ESBL Escherichia coli   Hydronephrosis of left kidney   COPD (chronic obstructive pulmonary disease) (HCC)   Blocked nephrostomy tube with hydronephrosis and hydroureter (HCC)   Chronic diastolic CHF (congestive heart failure) (HCC)   HTN  (hypertension)   Endometrial cancer (HCC)   History of pulmonary embolism/DVT s/p IVC filter   Class 3 obesity (HCC)   Assessment and Plan: Acute pyelonephritis secondary to ESBL E. coli. Sepsis ruled in and POA. History of ESBL E. coli UTI 04/06/2024 Possible obstruction of left nephrostomy/nephroureteral catheter with left hydronephrosis. Patient with frequent UTI including ESBL E. coli presenting with fever and tachycardia and left flank pain, pyelonephritis on CT Patient met sepsis criteria at time of admission with a fever of 102, heart rate 95.  No lactic acidosis. Continue meropenem  given recent ESBL, patient has been seen by ID, will continue meropenem  until 11/14. Urology consulted due to possible obstruction (hydronephrosis with decompressed bladder in spite of nephroureteral catheter) IR consulted for nephrostomy exchange as recommended by urology on ER consult Patient condition has improved, will finish out antibiotics before discharge.  COPD (chronic obstructive pulmonary disease) (HCC) Still stable, no exacerbation.  Chronic diastolic CHF (congestive heart failure) (HCC) No exacerbation.  HTN (hypertension) Will hold blood pressure meds tonight in the setting of sepsis diagnosis  Endometrial cancer [facility]) Follow-up as outpatient with oncology.  History of pulmonary embolism/DVT s/p IVC filter No acute issues suspected  0Class 3 obesity (HCC) with BMI 40.88. Complicating factor to overall prognosis and care  Chronic bilateral lower extremity lymphedema. Follow-up with PCP as outpatient Restarted oral Lasix .       Subjective:  Patient doing well today, was able to walk with physical therapy.  Physical Exam: Vitals:   05/18/24 2147 05/19/24 0434 05/19/24 0804 05/19/24 1140  BP: (!) 141/51 (!) 117/46 (!) 125/50 125/60  Pulse: (!) 58 64 67 66  Resp: 19 18 20    Temp: 99  F (37.2 C) 98.7 F (37.1 C) 98.2 F (36.8 C)   TempSrc: Oral Oral    SpO2: 97% 92%  98%   Weight:      Height:       General exam: Appears calm and comfortable  Respiratory system: Clear to auscultation. Respiratory effort normal. Cardiovascular system: S1 & S2 heard, RRR. No JVD, murmurs, rubs, gallops or clicks.  Gastrointestinal system: Abdomen is nondistended, soft and nontender. No organomegaly or masses felt. Normal bowel sounds heard. Central nervous system: Alert and oriented. No focal neurological deficits. Extremities: Chronic bilateral lower extremity lymphedema. Skin: No rashes, lesions or ulcers Psychiatry: Judgement and insight appear normal. Mood & affect appropriate.    Data Reviewed:  Reviewed CT scan results and lab results.  Family Communication: None  Disposition: Status is: Inpatient Remains inpatient appropriate because: Severity of disease, IV antibiotics.  Likely discharge to home health 11/14.     Time spent: 50 minutes  Author: Murvin Mana, MD 05/19/2024 12:11 PM  For on call review [email or web address].

## 2024-05-19 NOTE — Progress Notes (Signed)
 Physical Therapy Treatment Patient Details Name: Courtney Mack MRN: [id number] DOB: January 06, 1956 Today's Date: 05/19/2024   History of Present Illness Pt is a 68 y/o F admitted on 05/14/24 after presenting with c/o fever & flank pain. Pt is being treated for acute pyelonephritis, sepsis. PMH: nephrostomy 2/2 chronic L sided hydronephrosis, frequent UTIs, HTN, COPD, morbid obesity, PE/DVT s/p IVC filter, endometrial CA, GI bleed, HFpEF    PT Comments  Pt pleasant and eager to do some ambulation this afternoon.  She needed significant assist with bed mobility (to and from sitting, per baseline) but once standing showed good relative confidence in the walker and was able to ambulate ~60 ft with slow but consistent and relatively safe effort.  No LOBs, no overt safety issues, mild fatigue with consistent vitals.  Pt will benefit from ongoing PT, continue with POC.    If plan is discharge home, recommend the following: A little help with walking and/or transfers;A little help with bathing/dressing/bathroom;Assistance with cooking/housework;Assist for transportation;Help with stairs or ramp for entrance   Can travel by private vehicle        Equipment Recommendations  None recommended by PT    Recommendations for Other Services       Precautions / Restrictions Precautions Precautions: Fall Recall of Precautions/Restrictions: Intact Precaution/Restrictions Comments: L nephrostomy tube Restrictions Weight Bearing Restrictions Per Provider Order: No     Mobility  Bed Mobility Overal bed mobility: Needs Assistance Bed Mobility: Supine to Sit, Sit to Supine     Supine to sit: Mod assist, HOB elevated Sit to supine: Max assist, HOB elevated   General bed mobility comments: assist for BLE- heavy assist back into bed - reports family helps at baseline    Transfers Overall transfer level: Needs assistance Equipment used: Rolling walker (2 wheels) Transfers: Sit to/from Stand Sit to  Stand: From elevated surface, Min assist           General transfer comment: PT holding walker down as pt leveraged forward on it - b/l knee ROM limited but once upright into the walker good confidence    Ambulation/Gait Ambulation/Gait assistance: Contact guard assist Gait Distance (Feet): 60 Feet Assistive device: Rolling walker (2 wheels)         General Gait Details: 2 small loops in the room with slow but steady gait.  Reliant on walker (PT adjusted height down for better mechanics) - pt near her baseline needing just mild cuing for consistency and walker positoining   Stairs             Wheelchair Mobility     Tilt Bed    Modified Rankin (Stroke Patients Only)       Balance Overall balance assessment: Needs assistance Sitting-balance support: No upper extremity supported, Feet supported Sitting balance-Leahy Scale: Good     Standing balance support: Bilateral upper extremity supported, Reliant on assistive device for balance, During functional activity Standing balance-Leahy Scale: Fair                              Hotel Manager: No apparent difficulties  Cognition Arousal: Alert Behavior During Therapy: WFL for tasks assessed/performed                             Following commands: Intact      Cueing Cueing Techniques: Verbal cues  Exercises      General Comments  Pertinent Vitals/Pain Pain Assessment Pain Score: 5  Pain Location: L flank, nephrostomy tube insertion    Home Living                          Prior Function            PT Goals (current goals can now be found in the care plan section) Progress towards PT goals: Progressing toward goals    Frequency    Min 2X/week      PT Plan      Co-evaluation              AM-PAC PT 6 Clicks Mobility   Outcome Measure  Help needed turning from your back to your side while in a flat bed without  using bedrails?: A Little Help needed moving from lying on your back to sitting on the side of a flat bed without using bedrails?: A Lot Help needed moving to and from a bed to a chair (including a wheelchair)?: A Little Help needed standing up from a chair using your arms (e.g., wheelchair or bedside chair)?: A Little Help needed to walk in hospital room?: A Little Help needed climbing 3-5 steps with a railing? : A Lot 6 Click Score: 16    End of Session   Activity Tolerance: Patient limited by fatigue Patient left: with bed alarm set;with call bell/phone within reach Nurse Communication: Mobility status PT Visit Diagnosis: Muscle weakness (generalized) (M62.81);Difficulty in walking, not elsewhere classified (R26.2)     Time: 8442-8386 PT Time Calculation (min) (ACUTE ONLY): 16 min  Charges:    $Gait Training: 8-22 mins PT General Charges $$ ACUTE PT VISIT: 1 Visit                     Carmin JONELLE Deed, DPT 05/19/2024, 4:46 PM

## 2024-05-20 ENCOUNTER — Other Ambulatory Visit: Payer: Self-pay

## 2024-05-20 ENCOUNTER — Encounter: Payer: Self-pay | Admitting: Oncology

## 2024-05-20 ENCOUNTER — Encounter: Payer: Self-pay | Admitting: Infectious Diseases

## 2024-05-20 DIAGNOSIS — N390 Urinary tract infection, site not specified: Secondary | ICD-10-CM | POA: Diagnosis not present

## 2024-05-20 DIAGNOSIS — N10 Acute tubulo-interstitial nephritis: Secondary | ICD-10-CM | POA: Diagnosis not present

## 2024-05-20 DIAGNOSIS — N99528 Other complication of other external stoma of urinary tract: Secondary | ICD-10-CM

## 2024-05-20 DIAGNOSIS — E66813 Obesity, class 3: Secondary | ICD-10-CM | POA: Diagnosis not present

## 2024-05-20 LAB — CBC
HCT: 28.6 % — ABNORMAL LOW (ref 36.0–46.0)
Hemoglobin: 8.5 g/dL — ABNORMAL LOW (ref 12.0–15.0)
MCH: 24.9 pg — ABNORMAL LOW (ref 26.0–34.0)
MCHC: 29.7 g/dL — ABNORMAL LOW (ref 30.0–36.0)
MCV: 83.9 fL (ref 80.0–100.0)
Platelets: 242 K/uL (ref 150–400)
RBC: 3.41 MIL/uL — ABNORMAL LOW (ref 3.87–5.11)
RDW: 17.6 % — ABNORMAL HIGH (ref 11.5–15.5)
WBC: 3.2 K/uL — ABNORMAL LOW (ref 4.0–10.5)
nRBC: 0 % (ref 0.0–0.2)

## 2024-05-20 LAB — BASIC METABOLIC PANEL WITH GFR
Anion gap: 7 (ref 5–15)
BUN: 14 mg/dL (ref 8–23)
CO2: 24 mmol/L (ref 22–32)
Calcium: 8.5 mg/dL — ABNORMAL LOW (ref 8.9–10.3)
Chloride: 109 mmol/L (ref 98–111)
Creatinine, Ser: 0.65 mg/dL (ref 0.44–1.00)
GFR, Estimated: >60 mL/min (ref >60)
Glucose, Bld: 93 mg/dL (ref 70–99)
Potassium: 3.9 mmol/L (ref 3.5–5.1)
Sodium: 140 mmol/L (ref 135–145)

## 2024-05-20 LAB — MAGNESIUM: Magnesium: 1.9 mg/dL (ref 1.7–2.4)

## 2024-05-20 MED ORDER — POLYETHYLENE GLYCOL 3350 17 G PO PACK
17.0000 g | PACK | Freq: Once | ORAL | Status: DC
  Filled 2024-05-20: qty 1

## 2024-05-20 MED ORDER — POLYSACCHARIDE IRON COMPLEX 150 MG PO CAPS
150.0000 mg | ORAL_CAPSULE | Freq: Every day | ORAL | 0 refills | Status: AC
  Filled 2024-05-20: qty 30, 30d supply, fill #0

## 2024-05-20 MED ORDER — LACTULOSE 10 GM/15ML PO SOLN: 20.0000 g | Freq: Once | ORAL | Status: DC

## 2024-05-20 MED ORDER — SODIUM CHLORIDE 0.9 % IV SOLN
1.0000 g | Freq: Once | INTRAVENOUS | Status: AC
  Administered 2024-05-20: 1 g via INTRAVENOUS
  Filled 2024-05-20: qty 1000

## 2024-05-20 NOTE — Discharge Summary (Signed)
 Physician Discharge Summary   Patient: Courtney Mack MRN: [id number] DOB: 11/22/1955  Admit date:     05/14/2024  Discharge date: 05/20/24  Discharge Physician: Murvin Mana   PCP: [facility]   Recommendations at discharge:   Follow-up with PCP in 1 week.  Discharge Diagnoses: Principal Problem:   Complicated UTI (urinary tract infection) Active Problems:   Acute pyelonephritis   History of infection due to ESBL Escherichia coli   Hydronephrosis of left kidney   COPD (chronic obstructive pulmonary disease) (HCC)   Blocked nephrostomy tube with hydronephrosis and hydroureter (HCC)   Chronic diastolic CHF (congestive heart failure) (HCC)   HTN (hypertension)   Endometrial cancer (HCC)   History of pulmonary embolism/DVT s/p IVC filter   Class 3 obesity (HCC)  Resolved Problems:   * No resolved hospital problems. *  Hospital Course: Courtney Mack is a 68 y.o. female with medical history significant for nephrostomy secondary to chronic left-sided hydronephrosis, frequent UTIs requiring hospitalization most recently for ESBL E. coli 04/06/2024, on prophylactic Bactrim  since February 2025, hypertension, COPD, morbid obesity, PE and DVT s/p IVC filter on Lovenox  shots, endometrial cancer, history of GI bleed, chronic HFpEF.  She presented to the hospital because of fever (temperature 101.2 F) and left flank pain.   In the ED, temperature was 100.3 F, heart rate 100, respiratory rate 22, BP 131/45, O2 saturation 94% on room air.   CT abdomen and pelvis with contrast IMPRESSION: 1. Left-sided hydronephrosis with considerable perinephric stranding, new from the prior exam, with nephroureteral catheter in satisfactory position; the decompressed urinary bladder raises suspicion for catheter malfunction or kinking. 2. Right renal calculus measuring approximately 6 mm without obstructive changes.     Dr. Jacolyn, the ED physician, had spoken with  Dr. Carolee, urologist on-call, who recommended uncapping the nephrostomy tube and placing a bag to allow drainage in case of blockage of the nephroureteral catheter.  IR at the replaced nephrostomy tube on 11/11.   Patient was started on IV meropenem  because of recent ESBL E. coli.  UTI, which will be completed by 11/14. Patient condition has improved, will give her final dose of ertapenem before discharge.  Assessment and Plan: Acute pyelonephritis secondary to ESBL E. coli. Sepsis ruled in and POA. History of ESBL E. coli UTI 04/06/2024 Possible obstruction of left nephrostomy/nephroureteral catheter with left hydronephrosis. Patient with frequent UTI including ESBL E. coli presenting with fever and tachycardia and left flank pain, pyelonephritis on CT Patient met sepsis criteria at time of admission with a fever of 102, heart rate 95.  No lactic acidosis. Continue meropenem  given recent ESBL, patient has been seen by ID, will continue meropenem  until 11/14. Urology consulted due to possible obstruction (hydronephrosis with decompressed bladder in spite of nephroureteral catheter) IR consulted for nephrostomy exchange as recommended by urology on ER consult, per note, patient was in IR suite for exchange on 11/8.  Patient condition has improved, patient will receive the final dose antibiotics with ertapenem to complete the course.   COPD (chronic obstructive pulmonary disease) (HCC) Still stable, no exacerbation.   Chronic diastolic CHF (congestive heart failure) (HCC) No exacerbation.   HTN (hypertension) Will hold blood pressure meds tonight in the setting of sepsis diagnosis   Endometrial cancer [facility]) Follow-up as outpatient with oncology.   History of pulmonary embolism/DVT s/p IVC filter No acute issues suspected   0Class 3 obesity (HCC) with BMI 40.88. Complicating factor to overall prognosis and care  Chronic bilateral lower extremity lymphedema. Follow-up with PCP as  outpatient Restarted oral Lasix .              Consultants: Urology, IR Procedures performed: Nephrostomy catheter replacement. Disposition: Home health Diet recommendation:  Discharge Diet Orders (From admission, onward)     Start     Ordered   05/20/24 0000  Diet - low sodium heart healthy        05/20/24 1006           Cardiac diet DISCHARGE MEDICATION: Allergies as of 05/20/2024   No Known Allergies      Medication List     TAKE these medications    acetaminophen  500 MG tablet Commonly known as: TYLENOL  Take 500 mg by mouth every 6 (six) hours as needed for mild pain or fever.   albuterol  108 (90 Base) MCG/ACT inhaler Commonly known as: VENTOLIN  HFA Inhale 2 puffs into the lungs every 6 (six) hours as needed for wheezing or shortness of breath. What changed: when to take this   Breo Ellipta  100-25 MCG/ACT Aepb Generic drug: fluticasone  furoate-vilanterol Inhale 1 puff into the lungs daily.   cyanocobalamin  100 MCG tablet Take 100 mcg by mouth daily.   enoxaparin  30 MG/0.3ML injection Commonly known as: LOVENOX  Inject 0.3 mLs (30 mg total) into the skin every 12 (twelve) hours.   furosemide  20 MG tablet Commonly known as: LASIX  Take 20 mg by mouth daily.   iron  polysaccharides 150 MG capsule Commonly known as: NIFEREX Take 1 capsule (150 mg total) by mouth daily. Start taking on: May 21, 2024   loperamide  2 MG capsule Commonly known as: IMODIUM  Take 2 mg by mouth as needed for diarrhea or loose stools.   multivitamin with minerals Tabs tablet Take 1 tablet by mouth daily.   pantoprazole  40 MG tablet Commonly known as: PROTONIX  Take 40 mg by mouth 2 (two) times daily.   rosuvastatin  10 MG tablet Commonly known as: CRESTOR  TAKE 1 TABLET(10 MG) BY MOUTH DAILY   simethicone  80 MG chewable tablet Commonly known as: MYLICON Chew 1 tablet (80 mg total) by mouth every 6 (six) hours as needed for flatulence.   trimethoprim  100 MG  tablet Commonly known as: TRIMPEX  Take 1 tablet (100 mg total) by mouth at bedtime.   vitamin C  1000 MG tablet Take 1,000 mg by mouth daily.        Follow-up Information     [facility] Follow up in 1 week(s).   Specialty: General Practice Why: hospital follow up Contact information: [address] [place] [address] [phone]                Discharge Exam: Courtney Mack   05/14/24 2339  Weight: 91.8 kg   General exam: Appears calm and comfortable  Respiratory system: Clear to auscultation. Respiratory effort normal. Cardiovascular system: S1 & S2 heard, RRR. No JVD, murmurs, rubs, gallops or clicks.  Gastrointestinal system: Abdomen is nondistended, soft and nontender. No organomegaly or masses felt. Normal bowel sounds heard. Central nervous system: Alert and oriented. No focal neurological deficits. Extremities: Chronic bilateral lower extremity lymphedema Skin: No rashes, lesions or ulcers Psychiatry: Judgement and insight appear normal. Mood & affect appropriate.    Condition at discharge: good  The results of significant diagnostics from this hospitalization (including imaging, microbiology, ancillary and laboratory) are listed below for reference.   Imaging Studies: CT ABDOMEN PELVIS W CONTRAST Result Date: 05/14/2024 EXAM: CT ABDOMEN AND PELVIS  WITH CONTRAST 05/14/2024 08:56:58 PM TECHNIQUE: CT of the abdomen and pelvis was performed with the administration of 100 mL of iohexol  (OMNIPAQUE ) 300 MG/ML solution. Multiplanar reformatted images are provided for review. Automated exposure control, iterative reconstruction, and/or weight-based adjustment of the mA/kV was utilized to reduce the radiation dose to as low as reasonably achievable. COMPARISON: Comparison study 04/06/2024. CLINICAL HISTORY: Possible sepsis. FINDINGS: LOWER CHEST: Lung bases demonstrate some mild scarring. LIVER: The liver is within normal  limits. GALLBLADDER AND BILE DUCTS: Gallbladder is within normal limits. No biliary ductal dilatation. SPLEEN: The spleen is within normal limits. PANCREAS: The pancreas is unremarkable. ADRENAL GLANDS: No acute abnormality. KIDNEYS, URETERS AND BLADDER: Considerable perinephric stranding is noted on the left, new from the prior exam. A nephroureteral catheter is noted in satisfactory position. Left-sided hydronephrosis is noted with a decompressed urinary bladder, raising suspicion for catheter malfunction or kinking. A right renal calculus is noted measuring approximately 6 mm. No obstructive changes are seen on the right. The urinary bladder is decompressed. GI AND BOWEL: Stomach and small bowel are within normal limits. The colon shows scattered fecal material throughout. No obstructive changes are seen. No inflammatory changes are noted. The appendix is not well visualized and may have been surgically removed. PERITONEUM AND RETROPERITONEUM: No free fluid or free air is identified. VASCULATURE: Aortic calcifications are seen without an aneurysmal dilatation. IVC filter is seen in place. LYMPH NODES: No lymphadenopathy. REPRODUCTIVE ORGANS: Uterus is within normal limits. No adnexal mass is seen. BONES AND SOFT TISSUES: Degenerative changes of the hip joints are noted bilaterally. Lumbar spine degenerative changes are noted as well. IMPRESSION: 1. Left-sided hydronephrosis with considerable perinephric stranding, new from the prior exam, with nephroureteral catheter in satisfactory position; the decompressed urinary bladder raises suspicion for catheter malfunction or kinking. 2. Right renal calculus measuring approximately 6 mm without obstructive changes. Electronically signed by: Oneil Devonshire MD 05/14/2024 09:23 PM EST RP Workstation: [id number]   DG Chest Port 1 View Result Date: 05/14/2024 EXAM: 1 VIEW(S) XRAY OF THE CHEST 05/14/2024 07:32:00 PM COMPARISON: 04/16/2024 CLINICAL HISTORY: Questionable sepsis  - evaluate for abnormality FINDINGS: LUNGS AND PLEURA: Low lung volumes. No focal pulmonary opacity. No pulmonary edema. No pleural effusion. No pneumothorax. HEART AND MEDIASTINUM: Aortic atherosclerosis. No acute abnormality of the cardiac and mediastinal silhouettes. BONES AND SOFT TISSUES: No acute osseous abnormality. IMPRESSION: 1. No acute findings. Electronically signed by: Norman Gatlin MD 05/14/2024 08:02 PM EST RP Workstation: [id number]    Microbiology: Results for orders placed or performed during the hospital encounter of 05/14/24  Blood Culture (routine x 2)     Status: None   Collection Time: 05/14/24  7:24 PM   Specimen: BLOOD  Result Value Ref Range Status   Specimen Description BLOOD BLOOD LEFT ARM  Final   Special Requests   Final    BOTTLES DRAWN AEROBIC AND ANAEROBIC Blood Culture results may not be optimal due to an inadequate volume of blood received in culture bottles   Culture   Final    NO GROWTH 5 DAYS Performed at [facility], [address] [place], [place] [address]    Report Status 05/19/2024 FINAL  Final  Blood Culture (routine x 2)     Status: Abnormal   Collection Time: 05/14/24  7:24 PM   Specimen: BLOOD RIGHT HAND  Result Value Ref Range Status   Specimen Description   Final    BLOOD RIGHT HAND Performed at [facility], [address] [place], [place] [address]    Special Requests   Final    BOTTLES DRAWN AEROBIC AND ANAEROBIC Blood Culture adequate volume Performed at [facility], [address] [place], [place] [address]    Culture  Setup Time   Final    GRAM POSITIVE COCCI IN BOTH AEROBIC AND ANAEROBIC BOTTLES CRITICAL RESULT CALLED TO, READ BACK BY AND VERIFIED WITH:  RAQUEL RODRIGUEZ GUZMAN, PHARM D AT 1703 05/15/24 RAM    Culture (A)  Final    STAPHYLOCOCCUS EPIDERMIDIS THE SIGNIFICANCE OF ISOLATING THIS ORGANISM FROM A SINGLE SET OF BLOOD CULTURES WHEN MULTIPLE SETS ARE DRAWN IS UNCERTAIN. PLEASE NOTIFY  THE MICROBIOLOGY DEPARTMENT WITHIN ONE WEEK IF SPECIATION AND SENSITIVITIES ARE REQUIRED. Performed at [facility], [address] [place], [place] [address]    Report Status 05/17/2024 FINAL  Final  Blood Culture ID Panel (Reflexed)     Status: Abnormal   Collection Time: 05/14/24  7:24 PM  Result Value Ref Range Status   Enterococcus faecalis NOT DETECTED NOT DETECTED Final   Enterococcus Faecium NOT DETECTED NOT DETECTED Final   Listeria monocytogenes NOT DETECTED NOT DETECTED Final   Staphylococcus species DETECTED (A) NOT DETECTED Final    Comment: CRITICAL RESULT CALLED TO, READ BACK BY AND VERIFIED WITH: RAQUEL RODRIGUEZ GUZMAN, PHARM D AT 1703 05/15/24 RAM    Staphylococcus aureus (BCID) NOT DETECTED NOT DETECTED Final   Staphylococcus epidermidis DETECTED (A) NOT DETECTED Final    Comment: Methicillin (oxacillin) resistant coagulase negative staphylococcus. Possible blood culture contaminant (unless isolated from more than one blood culture draw or clinical case suggests pathogenicity). No antibiotic treatment is indicated for blood  culture contaminants. RAQUEL RODRIGUEZ GUZMAN, PHARM D AT 1703 05/15/24 RAM    Staphylococcus lugdunensis NOT DETECTED NOT DETECTED Final   Streptococcus species NOT DETECTED NOT DETECTED Final   Streptococcus agalactiae NOT DETECTED NOT DETECTED Final   Streptococcus pneumoniae NOT DETECTED NOT DETECTED Final   Streptococcus pyogenes NOT DETECTED NOT DETECTED Final   A.calcoaceticus-baumannii NOT DETECTED NOT DETECTED Final   Bacteroides fragilis NOT DETECTED NOT DETECTED Final   Enterobacterales NOT DETECTED NOT DETECTED Final   Enterobacter cloacae complex NOT DETECTED NOT DETECTED Final   Escherichia coli NOT DETECTED NOT DETECTED Final   Klebsiella aerogenes NOT DETECTED NOT DETECTED Final   Klebsiella oxytoca NOT DETECTED NOT DETECTED Final   Klebsiella pneumoniae NOT DETECTED NOT DETECTED Final   Proteus species NOT DETECTED NOT  DETECTED Final   Salmonella species NOT DETECTED NOT DETECTED Final   Serratia marcescens NOT DETECTED NOT DETECTED Final   Haemophilus influenzae NOT DETECTED NOT DETECTED Final   Neisseria meningitidis NOT DETECTED NOT DETECTED Final   Pseudomonas aeruginosa NOT DETECTED NOT DETECTED Final   Stenotrophomonas maltophilia NOT DETECTED NOT DETECTED Final   Candida albicans NOT DETECTED NOT DETECTED Final   Candida auris NOT DETECTED NOT DETECTED Final   Candida glabrata NOT DETECTED NOT DETECTED Final   Candida krusei NOT DETECTED NOT DETECTED Final   Candida parapsilosis NOT DETECTED NOT DETECTED Final   Candida tropicalis NOT DETECTED NOT DETECTED Final   Cryptococcus neoformans/gattii NOT DETECTED NOT DETECTED Final   Methicillin resistance mecA/C DETECTED (A) NOT DETECTED Final    CommentBETHA LILIA EVERN ELIZA BERDINE D AT 1703 05/15/24 RAM Performed at [facility], [address] [place], [place] [address]   Resp panel by RT-PCR (RSV, Flu A&B, Covid) Anterior Nasal Swab     Status: None   Collection  Time: 05/14/24  7:47 PM   Specimen: Anterior Nasal Swab  Result Value Ref Range Status   SARS Coronavirus 2 by RT PCR NEGATIVE NEGATIVE Final    Comment: (NOTE) SARS-CoV-2 target nucleic acids are NOT DETECTED.  The SARS-CoV-2 RNA is generally detectable in upper respiratory specimens during the acute phase of infection. The lowest concentration of SARS-CoV-2 viral copies this assay can detect is 138 copies/mL. A negative result does not preclude SARS-Cov-2 infection and should not be used as the sole basis for treatment or other patient management decisions. A negative result may occur with  improper specimen collection/handling, submission of specimen other than nasopharyngeal swab, presence of viral mutation(s) within the areas targeted by this assay, and inadequate number of viral copies(<138 copies/mL). A negative result must be combined with clinical  observations, patient history, and epidemiological information. The expected result is Negative.  Fact Sheet for Patients:  bloggercourse.com  Fact Sheet for Healthcare Providers:  seriousbroker.it  This test is no t yet approved or cleared by the [place]  FDA and  has been authorized for detection and/or diagnosis of SARS-CoV-2 by FDA under an Emergency Use Authorization (EUA). This EUA will remain  in effect (meaning this test can be used) for the duration of the COVID-19 declaration under Section 564(b)(1) of the Act, 21 U.S.C.section 360bbb-3(b)(1), unless the authorization is terminated  or revoked sooner.       Influenza A by PCR NEGATIVE NEGATIVE Final   Influenza B by PCR NEGATIVE NEGATIVE Final    Comment: (NOTE) The Xpert Xpress SARS-CoV-2/FLU/RSV plus assay is intended as an aid in the diagnosis of influenza from Nasopharyngeal swab specimens and should not be used as a sole basis for treatment. Nasal washings and aspirates are unacceptable for Xpert Xpress SARS-CoV-2/FLU/RSV testing.  Fact Sheet for Patients: bloggercourse.com  Fact Sheet for Healthcare Providers: seriousbroker.it  This test is not yet approved or cleared by the [place]  FDA and has been authorized for detection and/or diagnosis of SARS-CoV-2 by FDA under an Emergency Use Authorization (EUA). This EUA will remain in effect (meaning this test can be used) for the duration of the COVID-19 declaration under Section 564(b)(1) of the Act, 21 U.S.C. section 360bbb-3(b)(1), unless the authorization is terminated or revoked.     Resp Syncytial Virus by PCR NEGATIVE NEGATIVE Final    Comment: (NOTE) Fact Sheet for Patients: bloggercourse.com  Fact Sheet for Healthcare Providers: seriousbroker.it  This test is not yet approved or cleared by  the [place]  FDA and has been authorized for detection and/or diagnosis of SARS-CoV-2 by FDA under an Emergency Use Authorization (EUA). This EUA will remain in effect (meaning this test can be used) for the duration of the COVID-19 declaration under Section 564(b)(1) of the Act, 21 U.S.C. section 360bbb-3(b)(1), unless the authorization is terminated or revoked.  Performed at [facility], [address] [place], [place] [address]   Urine Culture (for pregnant, neutropenic or urologic patients or patients with an indwelling urinary catheter)     Status: Abnormal (Preliminary result)   Collection Time: 05/15/24 11:40 AM   Specimen: Urine, Clean Catch  Result Value Ref Range Status   Specimen Description   Final    URINE, CLEAN CATCH Performed at [facility], [address] [place], [place] [address]    Special Requests   Final    NONE Performed at [facility], [address], [place], [place] [address]    Culture (A)  Final    <  10,000 COLONIES/mL INSIGNIFICANT GROWTH Performed at [facility], [address] [place], [place] [address]    Report Status PENDING  Incomplete  Culture, blood (Routine X 2) w Reflex to ID Panel     Status: None (Preliminary result)   Collection Time: 05/16/24 10:50 AM   Specimen: BLOOD  Result Value Ref Range Status   Specimen Description BLOOD BLOOD LEFT HAND  Final   Special Requests   Final    BOTTLES DRAWN AEROBIC AND ANAEROBIC Blood Culture adequate volume   Culture   Final    NO GROWTH 4 DAYS Performed at [facility], [address] [place], [place] [address]    Report Status PENDING  Incomplete  Culture, blood (Routine X 2) w Reflex to ID Panel     Status: None (Preliminary result)   Collection Time: 05/16/24 11:03 AM   Specimen: BLOOD LEFT HAND  Result Value Ref Range Status   Specimen Description BLOOD LEFT HAND  Final   Special Requests Blood Culture adequate volume  Final    Culture   Final    NO GROWTH 4 DAYS Performed at [facility], [address] [place], [place] [address]    Report Status PENDING  Incomplete    Labs: CBC: Recent Labs  Lab 05/14/24 1924 05/15/24 0444 05/16/24 0613 05/17/24 0557 05/20/24 0812  WBC 10.0 11.3* 5.8 4.6 3.2*  NEUTROABS 8.9*  --   --   --   --   HGB 11.0* 9.5* 8.3* 8.4* 8.5*  HCT 35.7* 31.2* 28.1* 28.1* 28.6*  MCV 85.0 84.1 88.6 85.2 83.9  PLT 261 254 209 244 242   Basic Metabolic Panel: Recent Labs  Lab 05/14/24 1924 05/15/24 0444 05/16/24 0613 05/17/24 0557 05/20/24 0812  NA 136 137 138 140 140  K 4.1 4.0 3.6 3.9 3.9  CL 106 105 108 110 109  CO2 21* 22 22 21* 24  GLUCOSE 160* 129* 92 93 93  BUN 19 19 19 18 14   CREATININE 0.91 0.97 1.09* 0.92 0.65  CALCIUM  8.9 8.3* 7.9* 8.1* 8.5*  MG  --   --   --   --  1.9   Liver Function Tests: Recent Labs  Lab 05/14/24 1924  AST 25  ALT 11  ALKPHOS 55  BILITOT 1.0  PROT 7.3  ALBUMIN 3.1*   CBG: No results for input(s): GLUCAP in the last 168 hours.  Discharge time spent: 35 minutes.  Signed: Murvin Mana, MD [facility] 05/20/2024

## 2024-05-20 NOTE — TOC Progression Note (Signed)
 Transition of Care [facility]) - Progression Note    Patient Details  Name: Courtney Mack MRN: [id number] Date of Birth: 09/17/1955  [facility] CM/SW Contact  Marinda Cooks, RN Phone Number: 05/20/2024, 8:55 AM  Clinical Narrative:    This CM spoke with pt introduced role and dicussed DC Plan and recommendations per :PT or HH. Pt continued to decline services at this time. TOC will cont to follow DC planning/ are Coordination and update as applicable.                       Expected Discharge Plan and Services                                               Social Drivers of Health (SDOH) Interventions SDOH Screenings   Food Insecurity: Food Insecurity Present (05/14/2024)  Housing: Low Risk  (05/14/2024)  Recent Concern: Housing - High Risk (04/16/2024)  Transportation Needs: No Transportation Needs (05/14/2024)  Utilities: Not At Risk (05/14/2024)  Depression (PHQ2-9): Low Risk  (04/11/2022)  Social Connections: Socially Isolated (05/14/2024)  Tobacco Use: Low Risk  (05/14/2024)    Readmission Risk Interventions    04/07/2024    3:24 PM 08/07/2023   10:26 AM 07/24/2023   11:14 AM  Readmission Risk Prevention Plan  Transportation Screening Complete  Complete  PCP or Specialist Appt within 3-5 Days Complete    Social Work Consult for Recovery Care Planning/Counseling Complete    Palliative Care Screening Not Applicable    Medication Review Oceanographer) Complete Complete Complete  PCP or Specialist appointment within 3-5 days of discharge  Complete Complete  SW Recovery Care/Counseling Consult  Complete Complete  Palliative Care Screening  Not Applicable Not Applicable  Skilled Nursing Facility  Not Applicable Not Applicable

## 2024-05-20 NOTE — Care Management Important Message (Signed)
 Important Message  Patient Details  Name: Courtney Mack MRN: [id number] Date of Birth: 08/27/1955   Important Message Given:  Yes - Medicare IM     Valentine Kuechle W, CMA 05/20/2024, 2:28 PM

## 2024-05-20 NOTE — Progress Notes (Signed)
 Mobility Specialist - Progress Note   05/20/24 1151  Mobility  Activity Ambulated with assistance  Level of Assistance Standby assist, set-up cues, supervision of patient - no hands on  Assistive Device Front wheel walker  Distance Ambulated (ft) 20 ft  Activity Response Tolerated well  Mobility visit 1 Mobility  Mobility Specialist Start Time (ACUTE ONLY) 1135  Mobility Specialist Stop Time (ACUTE ONLY) 1150  Mobility Specialist Time Calculation (min) (ACUTE ONLY) 15 min   Pt supine upon entry, utilizing RA. Pt expressed feeling better this date. Pt amb to/from the door within the room MinG-SBA, tolerated well. Pt returned to bed, left supine with alarm set and needs within reach.  America Silvan Mobility Specialist 05/20/24 11:53 AM

## 2024-05-21 LAB — CULTURE, BLOOD (ROUTINE X 2)
Culture: NO GROWTH
Culture: NO GROWTH
Special Requests: ADEQUATE
Special Requests: ADEQUATE

## 2024-05-21 LAB — URINE CULTURE: Culture: <10000 — AB

## 2024-05-21 LAB — HOMOCYSTEINE: Homocysteine: 8.6 umol/L (ref 0.0–17.2)

## 2024-06-08 ENCOUNTER — Ambulatory Visit: Admitting: Urology

## 2024-07-09 ENCOUNTER — Other Ambulatory Visit (HOSPITAL_COMMUNITY): Payer: Self-pay

## 2024-07-12 ENCOUNTER — Encounter: Payer: Self-pay | Admitting: Urology

## 2024-07-12 ENCOUNTER — Ambulatory Visit: Admitting: Urology

## 2024-07-31 ENCOUNTER — Other Ambulatory Visit: Payer: Self-pay

## 2024-07-31 ENCOUNTER — Inpatient Hospital Stay
Admission: EM | Admit: 2024-07-31 | Discharge: 2024-08-06 | DRG: 698 | Disposition: A | Discharge status: Skilled Nursing Facility | Attending: Internal Medicine | Admitting: Internal Medicine

## 2024-07-31 ENCOUNTER — Emergency Department

## 2024-07-31 DIAGNOSIS — Z923 Personal history of irradiation: Secondary | ICD-10-CM

## 2024-07-31 DIAGNOSIS — E785 Hyperlipidemia, unspecified: Secondary | ICD-10-CM | POA: Diagnosis present

## 2024-07-31 DIAGNOSIS — I110 Hypertensive heart disease with heart failure: Secondary | ICD-10-CM | POA: Diagnosis present

## 2024-07-31 DIAGNOSIS — Z833 Family history of diabetes mellitus: Secondary | ICD-10-CM | POA: Diagnosis not present

## 2024-07-31 DIAGNOSIS — R7881 Bacteremia: Secondary | ICD-10-CM | POA: Diagnosis not present

## 2024-07-31 DIAGNOSIS — N1330 Unspecified hydronephrosis: Secondary | ICD-10-CM | POA: Diagnosis not present

## 2024-07-31 DIAGNOSIS — Z87440 Personal history of urinary (tract) infections: Secondary | ICD-10-CM

## 2024-07-31 DIAGNOSIS — Z9842 Cataract extraction status, left eye: Secondary | ICD-10-CM

## 2024-07-31 DIAGNOSIS — Z604 Social exclusion and rejection: Secondary | ICD-10-CM | POA: Diagnosis present

## 2024-07-31 DIAGNOSIS — L89316 Pressure-induced deep tissue damage of right buttock: Secondary | ICD-10-CM | POA: Diagnosis present

## 2024-07-31 DIAGNOSIS — A498 Other bacterial infections of unspecified site: Secondary | ICD-10-CM | POA: Diagnosis not present

## 2024-07-31 DIAGNOSIS — Z86718 Personal history of other venous thrombosis and embolism: Secondary | ICD-10-CM

## 2024-07-31 DIAGNOSIS — N136 Pyonephrosis: Secondary | ICD-10-CM | POA: Diagnosis present

## 2024-07-31 DIAGNOSIS — I5032 Chronic diastolic (congestive) heart failure: Secondary | ICD-10-CM | POA: Diagnosis present

## 2024-07-31 DIAGNOSIS — E7800 Pure hypercholesterolemia, unspecified: Secondary | ICD-10-CM | POA: Diagnosis present

## 2024-07-31 DIAGNOSIS — Z8542 Personal history of malignant neoplasm of other parts of uterus: Secondary | ICD-10-CM

## 2024-07-31 DIAGNOSIS — A4151 Sepsis due to Escherichia coli [E. coli]: Secondary | ICD-10-CM | POA: Diagnosis present

## 2024-07-31 DIAGNOSIS — I82409 Acute embolism and thrombosis of unspecified deep veins of unspecified lower extremity: Secondary | ICD-10-CM | POA: Diagnosis present

## 2024-07-31 DIAGNOSIS — J449 Chronic obstructive pulmonary disease, unspecified: Secondary | ICD-10-CM | POA: Diagnosis present

## 2024-07-31 DIAGNOSIS — D509 Iron deficiency anemia, unspecified: Secondary | ICD-10-CM | POA: Diagnosis present

## 2024-07-31 DIAGNOSIS — E66813 Obesity, class 3: Secondary | ICD-10-CM | POA: Diagnosis present

## 2024-07-31 DIAGNOSIS — E119 Type 2 diabetes mellitus without complications: Secondary | ICD-10-CM | POA: Diagnosis present

## 2024-07-31 DIAGNOSIS — Z8249 Family history of ischemic heart disease and other diseases of the circulatory system: Secondary | ICD-10-CM | POA: Diagnosis not present

## 2024-07-31 DIAGNOSIS — A419 Sepsis, unspecified organism: Principal | ICD-10-CM | POA: Diagnosis present

## 2024-07-31 DIAGNOSIS — N99528 Other complication of other external stoma of urinary tract: Secondary | ICD-10-CM | POA: Diagnosis not present

## 2024-07-31 DIAGNOSIS — N390 Urinary tract infection, site not specified: Secondary | ICD-10-CM | POA: Diagnosis not present

## 2024-07-31 DIAGNOSIS — T83011A Breakdown (mechanical) of indwelling urethral catheter, initial encounter: Secondary | ICD-10-CM | POA: Diagnosis not present

## 2024-07-31 DIAGNOSIS — I10 Essential (primary) hypertension: Secondary | ICD-10-CM | POA: Diagnosis present

## 2024-07-31 DIAGNOSIS — E875 Hyperkalemia: Secondary | ICD-10-CM | POA: Diagnosis present

## 2024-07-31 DIAGNOSIS — Y732 Prosthetic and other implants, materials and accessory gastroenterology and urology devices associated with adverse incidents: Secondary | ICD-10-CM | POA: Diagnosis present

## 2024-07-31 DIAGNOSIS — E872 Acidosis, unspecified: Secondary | ICD-10-CM | POA: Diagnosis present

## 2024-07-31 DIAGNOSIS — L89326 Pressure-induced deep tissue damage of left buttock: Secondary | ICD-10-CM | POA: Diagnosis present

## 2024-07-31 DIAGNOSIS — Z79899 Other long term (current) drug therapy: Secondary | ICD-10-CM

## 2024-07-31 DIAGNOSIS — D500 Iron deficiency anemia secondary to blood loss (chronic): Secondary | ICD-10-CM | POA: Diagnosis not present

## 2024-07-31 DIAGNOSIS — Z95828 Presence of other vascular implants and grafts: Secondary | ICD-10-CM

## 2024-07-31 DIAGNOSIS — Z1612 Extended spectrum beta lactamase (ESBL) resistance: Secondary | ICD-10-CM | POA: Diagnosis present

## 2024-07-31 DIAGNOSIS — Z961 Presence of intraocular lens: Secondary | ICD-10-CM | POA: Diagnosis present

## 2024-07-31 DIAGNOSIS — T83512A Infection and inflammatory reaction due to nephrostomy catheter, initial encounter: Secondary | ICD-10-CM | POA: Diagnosis present

## 2024-07-31 DIAGNOSIS — Z8619 Personal history of other infectious and parasitic diseases: Secondary | ICD-10-CM

## 2024-07-31 DIAGNOSIS — L89156 Pressure-induced deep tissue damage of sacral region: Secondary | ICD-10-CM | POA: Diagnosis present

## 2024-07-31 DIAGNOSIS — Z1152 Encounter for screening for COVID-19: Secondary | ICD-10-CM | POA: Diagnosis not present

## 2024-07-31 DIAGNOSIS — N99522 Malfunction of other external stoma of urinary tract: Secondary | ICD-10-CM | POA: Diagnosis present

## 2024-07-31 DIAGNOSIS — Z86711 Personal history of pulmonary embolism: Secondary | ICD-10-CM | POA: Diagnosis present

## 2024-07-31 DIAGNOSIS — Z5948 Other specified lack of adequate food: Secondary | ICD-10-CM

## 2024-07-31 DIAGNOSIS — N3941 Urge incontinence: Secondary | ICD-10-CM | POA: Diagnosis present

## 2024-07-31 DIAGNOSIS — B9620 Unspecified Escherichia coli [E. coli] as the cause of diseases classified elsewhere: Secondary | ICD-10-CM | POA: Diagnosis not present

## 2024-07-31 DIAGNOSIS — Z6841 Body Mass Index (BMI) 40.0 and over, adult: Secondary | ICD-10-CM

## 2024-07-31 DIAGNOSIS — N138 Other obstructive and reflux uropathy: Secondary | ICD-10-CM | POA: Diagnosis not present

## 2024-07-31 DIAGNOSIS — N179 Acute kidney failure, unspecified: Secondary | ICD-10-CM | POA: Diagnosis present

## 2024-07-31 DIAGNOSIS — Z803 Family history of malignant neoplasm of breast: Secondary | ICD-10-CM

## 2024-07-31 DIAGNOSIS — Z5941 Food insecurity: Secondary | ICD-10-CM

## 2024-07-31 DIAGNOSIS — Z9841 Cataract extraction status, right eye: Secondary | ICD-10-CM

## 2024-07-31 DIAGNOSIS — Z8616 Personal history of COVID-19: Secondary | ICD-10-CM

## 2024-07-31 DIAGNOSIS — Z83438 Family history of other disorder of lipoprotein metabolism and other lipidemia: Secondary | ICD-10-CM

## 2024-07-31 DIAGNOSIS — R0789 Other chest pain: Secondary | ICD-10-CM | POA: Diagnosis not present

## 2024-07-31 LAB — URINALYSIS, ROUTINE W REFLEX MICROSCOPIC
Bilirubin Urine: NEGATIVE
Glucose, UA: NEGATIVE mg/dL
Ketones, ur: NEGATIVE mg/dL
Nitrite: POSITIVE — AB
Protein, ur: 30 mg/dL — AB
Specific Gravity, Urine: 1.016 (ref 1.005–1.030)
pH: 5 (ref 5.0–8.0)

## 2024-07-31 LAB — CBC
HCT: 31.3 % — ABNORMAL LOW (ref 36.0–46.0)
Hemoglobin: 9.3 g/dL — ABNORMAL LOW (ref 12.0–15.0)
MCH: 27.3 pg (ref 26.0–34.0)
MCHC: 29.7 g/dL — ABNORMAL LOW (ref 30.0–36.0)
MCV: 91.8 fL (ref 80.0–100.0)
Platelets: 265 10*3/uL (ref 150–400)
RBC: 3.41 MIL/uL — ABNORMAL LOW (ref 3.87–5.11)
RDW: 18.9 % — ABNORMAL HIGH (ref 11.5–15.5)
WBC: 10.8 10*3/uL — ABNORMAL HIGH (ref 4.0–10.5)
nRBC: 0 % (ref 0.0–0.2)

## 2024-07-31 LAB — TROPONIN T, HIGH SENSITIVITY
Troponin T High Sensitivity: 19 ng/L (ref 0–19)
Troponin T High Sensitivity: 18 ng/L (ref 0–19)

## 2024-07-31 LAB — LACTIC ACID, PLASMA
Lactic Acid, Venous: 3.1 mmol/L (ref 0.5–1.9)
Lactic Acid, Venous: 2.4 mmol/L (ref 0.5–1.9)
Lactic Acid, Venous: 1.8 mmol/L (ref 0.5–1.9)
Lactic Acid, Venous: 1.1 mmol/L (ref 0.5–1.9)

## 2024-07-31 LAB — RESP PANEL BY RT-PCR (RSV, FLU A&B, COVID)  RVPGX2
Influenza A by PCR: NEGATIVE
Influenza B by PCR: NEGATIVE
Resp Syncytial Virus by PCR: NEGATIVE
SARS Coronavirus 2 by RT PCR: NEGATIVE

## 2024-07-31 LAB — PROTIME-INR
INR: 1.1 (ref 0.8–1.2)
Prothrombin Time: 15.3 s — ABNORMAL HIGH (ref 11.4–15.2)

## 2024-07-31 LAB — COMPREHENSIVE METABOLIC PANEL WITH GFR
ALT: 13 U/L (ref 0–44)
AST: 43 U/L — ABNORMAL HIGH (ref 15–41)
Albumin: 3.3 g/dL — ABNORMAL LOW (ref 3.5–5.0)
Alkaline Phosphatase: 63 U/L (ref 38–126)
Anion gap: 12 (ref 5–15)
BUN: 20 mg/dL (ref 8–23)
CO2: 21 mmol/L — ABNORMAL LOW (ref 22–32)
Calcium: 9.2 mg/dL (ref 8.9–10.3)
Chloride: 104 mmol/L (ref 98–111)
Creatinine, Ser: 1.39 mg/dL — ABNORMAL HIGH (ref 0.44–1.00)
GFR, Estimated: 41 mL/min — ABNORMAL LOW
Glucose, Bld: 156 mg/dL — ABNORMAL HIGH (ref 70–99)
Potassium: 5.2 mmol/L — ABNORMAL HIGH (ref 3.5–5.1)
Sodium: 137 mmol/L (ref 135–145)
Total Bilirubin: 1.2 mg/dL (ref 0.0–1.2)
Total Protein: 6.7 g/dL (ref 6.5–8.1)

## 2024-07-31 LAB — PRO BRAIN NATRIURETIC PEPTIDE: Pro Brain Natriuretic Peptide: 510 pg/mL — ABNORMAL HIGH

## 2024-07-31 MED ORDER — POLYSACCHARIDE IRON COMPLEX 150 MG PO CAPS
150.0000 mg | ORAL_CAPSULE | Freq: Every day | ORAL | Status: DC
  Administered 2024-08-01 – 2024-08-06 (×6): 150 mg via ORAL
  Filled 2024-07-31 (×6): qty 1

## 2024-07-31 MED ORDER — SODIUM CHLORIDE 0.9 % IV SOLN
1.0000 g | Freq: Two times a day (BID) | INTRAVENOUS | Status: DC
  Administered 2024-07-31 – 2024-08-03 (×6): 1 g via INTRAVENOUS
  Filled 2024-07-31 (×6): qty 20

## 2024-07-31 MED ORDER — LACTATED RINGERS IV SOLN: INTRAVENOUS | Status: AC

## 2024-07-31 MED ORDER — LOPERAMIDE HCL 2 MG PO CAPS: 2.0000 mg | ORAL_CAPSULE | ORAL | Status: DC | PRN

## 2024-07-31 MED ORDER — SODIUM CHLORIDE 0.9 % IV SOLN
500.0000 mg | Freq: Once | INTRAVENOUS | Status: AC
  Administered 2024-07-31: 500 mg via INTRAVENOUS
  Filled 2024-07-31: qty 5

## 2024-07-31 MED ORDER — ADULT MULTIVITAMIN W/MINERALS CH
1.0000 | ORAL_TABLET | Freq: Every day | ORAL | Status: DC
  Administered 2024-07-31 – 2024-08-06 (×7): 1 via ORAL
  Filled 2024-07-31 (×7): qty 1

## 2024-07-31 MED ORDER — SODIUM CHLORIDE 0.9 % IV SOLN
2.0000 g | Freq: Once | INTRAVENOUS | Status: AC
  Administered 2024-07-31: 2 g via INTRAVENOUS
  Filled 2024-07-31: qty 20

## 2024-07-31 MED ORDER — DM-GUAIFENESIN ER 30-600 MG PO TB12
1.0000 | ORAL_TABLET | Freq: Two times a day (BID) | ORAL | Status: DC | PRN
  Administered 2024-08-02: 1 via ORAL
  Filled 2024-07-31: qty 1

## 2024-07-31 MED ORDER — LACTATED RINGERS IV BOLUS
1000.0000 mL | Freq: Once | INTRAVENOUS | Status: AC
  Administered 2024-07-31: 1000 mL via INTRAVENOUS

## 2024-07-31 MED ORDER — VITAMIN C 500 MG PO TABS
1000.0000 mg | ORAL_TABLET | Freq: Every day | ORAL | Status: DC
  Administered 2024-08-01 – 2024-08-06 (×6): 1000 mg via ORAL
  Filled 2024-07-31 (×6): qty 2

## 2024-07-31 MED ORDER — PANTOPRAZOLE SODIUM 40 MG PO TBEC
40.0000 mg | DELAYED_RELEASE_TABLET | Freq: Two times a day (BID) | ORAL | Status: DC
  Administered 2024-07-31 – 2024-08-06 (×12): 40 mg via ORAL
  Filled 2024-07-31 (×12): qty 1

## 2024-07-31 MED ORDER — LACTATED RINGERS IV BOLUS
2000.0000 mL | Freq: Once | INTRAVENOUS | Status: AC
  Administered 2024-07-31: 2000 mL via INTRAVENOUS

## 2024-07-31 MED ORDER — ALBUTEROL SULFATE (2.5 MG/3ML) 0.083% IN NEBU: 2.5000 mg | INHALATION_SOLUTION | RESPIRATORY_TRACT | Status: DC | PRN

## 2024-07-31 MED ORDER — ROSUVASTATIN CALCIUM 10 MG PO TABS
10.0000 mg | ORAL_TABLET | Freq: Every day | ORAL | Status: DC
  Administered 2024-07-31 – 2024-08-06 (×7): 10 mg via ORAL
  Filled 2024-07-31 (×7): qty 1

## 2024-07-31 MED ORDER — ACETAMINOPHEN 325 MG PO TABS
650.0000 mg | ORAL_TABLET | Freq: Four times a day (QID) | ORAL | Status: DC | PRN
  Administered 2024-07-31 – 2024-08-02 (×3): 650 mg via ORAL
  Filled 2024-07-31 (×3): qty 2

## 2024-07-31 MED ORDER — FLUTICASONE FUROATE-VILANTEROL 100-25 MCG/ACT IN AEPB
1.0000 | INHALATION_SPRAY | Freq: Every day | RESPIRATORY_TRACT | Status: DC
  Administered 2024-07-31 – 2024-08-06 (×6): 1 via RESPIRATORY_TRACT
  Filled 2024-07-31: qty 28

## 2024-07-31 MED ORDER — ONDANSETRON HCL 4 MG/2ML IJ SOLN: 4.0000 mg | Freq: Three times a day (TID) | INTRAMUSCULAR | Status: DC | PRN

## 2024-07-31 MED ORDER — SODIUM ZIRCONIUM CYCLOSILICATE 10 G PO PACK
10.0000 g | PACK | Freq: Once | ORAL | Status: AC
  Administered 2024-07-31: 10 g via ORAL
  Filled 2024-07-31: qty 1

## 2024-07-31 MED ORDER — ENOXAPARIN SODIUM 30 MG/0.3ML IJ SOSY
30.0000 mg | PREFILLED_SYRINGE | Freq: Two times a day (BID) | INTRAMUSCULAR | Status: DC
  Administered 2024-07-31 – 2024-08-06 (×12): 30 mg via SUBCUTANEOUS
  Filled 2024-07-31 (×12): qty 0.3

## 2024-07-31 MED ORDER — VITAMIN B-12 1000 MCG PO TABS
500.0000 ug | ORAL_TABLET | Freq: Every day | ORAL | Status: DC
  Administered 2024-07-31 – 2024-08-06 (×7): 500 ug via ORAL
  Filled 2024-07-31 (×7): qty 1

## 2024-07-31 NOTE — ED Triage Notes (Signed)
 Patient coming from home. Woke up feeling bad. Hx of infection, complaining of SOB and difficulty breathing. Patient also stating she is anxious by breathing heavily. Dialysis pt. Or has a catheter for dialysis. AOX4  EMS VS: WNL

## 2024-07-31 NOTE — ED Notes (Addendum)
 Assisted RN with cleaning pt by changing brief and applying clean brief on pt at this time. Assisted RN with in and out procedure by holding pt legs in position for RN to complete this procedure to obtain urine sample. Gave pt warm blanket.

## 2024-07-31 NOTE — Sepsis Progress Note (Signed)
 Sepsis protocol is being followed by [facility].

## 2024-07-31 NOTE — H&P (Signed)
 " History and Physical    Courtney Mack FMW:[id number] DOB: [birth year] DOA: 07/31/2024  Referring MD/NP/PA:   PCP: [facility]   Patient coming from:  The patient is coming from home.     Chief Complaint: Weakness, dysuria, shortness of breath.  HPI: Courtney Mack is a 69 y.o. female with medical history significant of left-sided hydronephrosis status post nephrostomy tube placement, recurrent UTI and sepsis due to UTI, ESBL, hypertension, COPD, history of extensive bilateral PE and DVT (s/p of IVC filter, on lovenox ), dCHF, who presents with weakness, dysuria and shortness of breath.  Patient states that she she feels fatigued and tired today.  She has dysuria and burning on urination.  No urinary frequency and gross hematuria.  Patient has chills, no fever.  She also reports SOB on exertion earlier, which has resolved currently.  Patient has mild dry cough, no chest pain.  No sore throat or runny nose.  Patient does not have nausea vomiting, diarrhea or abdominal pain.  No respiratory distress.  Data reviewed independently and ED Course: pt was found to have WBC 10.8, lactic acid 3.1 --> 2.4, positive UA (cloudy appearance, large amount of leukocyte, positive nitrite, rare bacteria, WBC 6-10), BNP  510, negative PCR for COVID, flu and RSV, INR 1.1, potassium 5.2, AKI.  Temperature normal, soft blood pressure 99/43, heart rate 96 --> 84, RR 24 --> 16, oxygen saturation 98% on room air.  Chest x-ray negative.  Patient is admitted to telemetry bed as inpatient.   EKG: I have personally reviewed.  Sinus rhythm, QTc 441, low voltage, nonspecific T wave change.   Review of Systems:   General: no fevers, has chills, no body weight gain, has poor appetite, has fatigue HEENT: no blurry vision, hearing changes or sore throat Respiratory: has dyspnea, coughing, no wheezing CV: no chest pain, no palpitations GI: no nausea, vomiting, abdominal pain, diarrhea,  constipation GU: no dysuria, burning on urination, increased urinary frequency, hematuria  Ext: no leg edema Neuro: no unilateral weakness, numbness, or tingling, no vision change or hearing loss Skin: no rash, no skin tear. MSK: No muscle spasm, no deformity, no limitation of range of movement in spin Heme: No easy bruising.  Travel history: No recent long distant travel.   Allergy: Allergies[1]  Past Medical History:  Diagnosis Date   Acute bilateral deep vein thrombosis (DVT) of femoral veins (HCC) 01/02/2021   Acute massive pulmonary embolism (HCC) 08/25/2020   Last Assessment & Plan:  Formatting of this note might be different from the original. Post vena caval filter and on xarelto  and O2   Anemia    ARF (acute respiratory failure) (HCC)    Cellulitis of left lower leg 06/20/2021   CHF (congestive heart failure) (HCC)    COVID-19    Diabetes mellitus without complication (HCC)    DVT (deep venous thrombosis) (HCC) 09/06/2020   Endometrial cancer (HCC)    GAVE (gastric antral vascular ectasia)    GERD (gastroesophageal reflux disease)    High cholesterol    Hx of blood clots    Hyperlipidemia    Hypertension    IDA (iron  deficiency anemia) 09/21/2020   Obesity    Pneumonia 06/11/2022   recovered   Pressure injury of skin 06/21/2021   Pulmonary embolism (HCC)    Sepsis (HCC)    Thrombocytopenia    Urinary tract infection 09/13/2021    Past Surgical History:  Procedure Laterality Date   CATARACT EXTRACTION W/PHACO Right  06/04/2022   Procedure: CATARACT EXTRACTION PHACO AND INTRAOCULAR LENS PLACEMENT (IOC) COMPLICATED RIGHT  31.83  02:17.4;  Surgeon: Jaye Fallow, MD;  Location: [facility];  Service: Ophthalmology;  Laterality: Right;   CATARACT EXTRACTION W/PHACO Left 07/09/2022   Procedure: CATARACT EXTRACTION PHACO AND INTRAOCULAR LENS PLACEMENT (IOC);  Surgeon: Jaye Fallow, MD;  Location: [facility];  Service: Ophthalmology;  Laterality:  Left;  22.75 1:40.3   COLONOSCOPY N/A 06/21/2021   Procedure: COLONOSCOPY;  Surgeon: Toledo, Ladell POUR, MD;  Location: [facility];  Service: Gastroenterology;  Laterality: N/A;   CYSTOSCOPY W/ URETERAL STENT PLACEMENT Left 09/11/2021   Procedure: CYSTOSCOPY WITH RETROGRADE PYELOGRAM/URETERAL STENT PLACEMENT;  Surgeon: Twylla Glendia BROCKS, MD;  Location: [facility];  Service: Urology;  Laterality: Left;   CYSTOSCOPY W/ URETERAL STENT PLACEMENT Left 11/17/2021   Procedure: CYSTOSCOPY WITH STENT REPLACEMENT;  Surgeon: Watt Rush, MD;  Location: [facility];  Service: Urology;  Laterality: Left;   CYSTOSCOPY WITH FULGERATION  11/17/2021   Procedure: CYSTOSCOPY WITH FULGERATION;  Surgeon: Watt Rush, MD;  Location: [facility];  Service: Urology;;   CYSTOSCOPY WITH STENT PLACEMENT Left 10/17/2021   Procedure: CYSTOSCOPY WITH STENT PLACEMENT;  Surgeon: Twylla Glendia BROCKS, MD;  Location: [facility];  Service: Urology;  Laterality: Left;   CYSTOSCOPY WITH URETEROSCOPY, STONE BASKETRY AND STENT PLACEMENT Left 10/16/2021   Procedure: CYSTOSCOPY WITH URETEROSCOPY  AND STENT REMOVAL;  Surgeon: Twylla Glendia BROCKS, MD;  Location: [facility];  Service: Urology;  Laterality: Left;   ENTEROSCOPY N/A 08/17/2021   Procedure: ENTEROSCOPY;  Surgeon: Unk Corinn Skiff, MD;  Location: [facility];  Service: Gastroenterology;  Laterality: N/A;   ESOPHAGOGASTRODUODENOSCOPY N/A 06/21/2021   Procedure: ESOPHAGOGASTRODUODENOSCOPY (EGD);  Surgeon: Toledo, Ladell POUR, MD;  Location: [facility];  Service: Gastroenterology;  Laterality: N/A;   ESOPHAGOGASTRODUODENOSCOPY (EGD) WITH PROPOFOL  N/A 08/17/2021   Procedure: ESOPHAGOGASTRODUODENOSCOPY (EGD) WITH PROPOFOL ;  Surgeon: Unk Corinn Skiff, MD;  Location: [facility];  Service: Gastroenterology;  Laterality: N/A;   ESOPHAGOGASTRODUODENOSCOPY (EGD) WITH PROPOFOL  N/A 10/14/2021   Procedure: ESOPHAGOGASTRODUODENOSCOPY (EGD) WITH PROPOFOL ;  Surgeon: Jinny Carmine, MD;  Location: [facility];  Service: Endoscopy;  Laterality: N/A;   ESOPHAGOGASTRODUODENOSCOPY (EGD) WITH PROPOFOL  N/A 02/01/2022   Procedure: ESOPHAGOGASTRODUODENOSCOPY (EGD) WITH PROPOFOL ;  Surgeon: Unk Corinn Skiff, MD;  Location: [facility];  Service: Gastroenterology;  Laterality: N/A;   ESOPHAGOGASTRODUODENOSCOPY (EGD) WITH PROPOFOL  N/A 03/15/2022   Procedure: ESOPHAGOGASTRODUODENOSCOPY (EGD) WITH PROPOFOL ;  Surgeon: Therisa Bi, MD;  Location: [facility] ENDOSCOPY;  Service: Gastroenterology;  Laterality: N/A;   ESOPHAGOGASTRODUODENOSCOPY (EGD) WITH PROPOFOL  N/A 04/18/2022   Procedure: ESOPHAGOGASTRODUODENOSCOPY (EGD) WITH PROPOFOL ;  Surgeon: Unk Corinn Skiff, MD;  Location: [facility];  Service: Gastroenterology;  Laterality: N/A;   ESOPHAGOGASTRODUODENOSCOPY (EGD) WITH PROPOFOL  N/A 08/09/2022   Procedure: ESOPHAGOGASTRODUODENOSCOPY (EGD) WITH PROPOFOL ;  Surgeon: Therisa Bi, MD;  Location: [facility] ENDOSCOPY;  Service: Gastroenterology;  Laterality: N/A;   GIVENS CAPSULE STUDY N/A 06/22/2021   Procedure: GIVENS CAPSULE STUDY;  Surgeon: Toledo, Ladell POUR, MD;  Location: [facility];  Service: Gastroenterology;  Laterality: N/A;   IR CONVERT LEFT NEPHROSTOMY TO NEPHROURETERAL CATH  08/29/2023   IR EXT NEPHROURETERAL CATH EXCHANGE  03/05/2024   IR NEPHROSTOMY EXCHANGE LEFT  02/25/2022   IR NEPHROSTOMY EXCHANGE LEFT  03/27/2022   IR NEPHROSTOMY EXCHANGE LEFT  05/22/2022   IR NEPHROSTOMY EXCHANGE LEFT  07/24/2022   IR NEPHROSTOMY EXCHANGE LEFT  09/24/2022   IR NEPHROSTOMY EXCHANGE LEFT  11/21/2022   IR NEPHROSTOMY EXCHANGE LEFT  02/17/2023   IR NEPHROSTOMY  EXCHANGE LEFT  04/06/2023   IR NEPHROSTOMY EXCHANGE LEFT  05/27/2023   IR NEPHROSTOMY EXCHANGE LEFT  07/28/2023   IR NEPHROSTOMY EXCHANGE LEFT  05/17/2024   IR NEPHROSTOMY PLACEMENT LEFT  12/27/2021   IVC FILTER INSERTION N/A 08/18/2020   Procedure: IVC FILTER INSERTION;  Surgeon: Jama Cordella MATSU, MD;  Location: [facility];  Service:  Cardiovascular;  Laterality: N/A;   PERIPHERAL VASCULAR THROMBECTOMY Bilateral 01/03/2021   Procedure: PERIPHERAL VASCULAR THROMBECTOMY;  Surgeon: Marea Selinda RAMAN, MD;  Location: [facility];  Service: Cardiovascular;  Laterality: Bilateral;   PULMONARY THROMBECTOMY N/A 08/18/2020   Procedure: PULMONARY THROMBECTOMY;  Surgeon: Jama Cordella MATSU, MD;  Location: [facility];  Service: Cardiovascular;  Laterality: N/A;   TUBAL LIGATION     VISCERAL ANGIOGRAPHY N/A 07/18/2021   Procedure: VISCERAL ANGIOGRAPHY;  Surgeon: Marea Selinda RAMAN, MD;  Location: [facility];  Service: Cardiovascular;  Laterality: N/A;    Social History:  reports that she has never smoked. She has never used smokeless tobacco. She reports that she does not currently use alcohol . She reports that she does not use drugs.  Family History:  Family History  Problem Relation Age of Onset   Hypertension Mother    Diabetes Mother    Diabetes Father    Hypertension Father    High Cholesterol Father    Congestive Heart Failure Father    Breast cancer Cousin      Prior to Admission medications  Medication Sig Start Date End Date Taking? Authorizing Provider  acetaminophen  (TYLENOL ) 500 MG tablet Take 500 mg by mouth every 6 (six) hours as needed for mild pain or fever.    [provider]  albuterol  (VENTOLIN  HFA) 108 (90 Base) MCG/ACT inhaler Inhale 2 puffs into the lungs every 6 (six) hours as needed for wheezing or shortness of breath. Patient taking differently: Inhale 2 puffs into the lungs every 4 (four) hours as needed for wheezing or shortness of breath. 10/26/22   Laurita Pillion, MD  Ascorbic Acid  (VITAMIN C ) 1000 MG tablet Take 1,000 mg by mouth daily.    [provider]  cyanocobalamin  100 MCG tablet Take 100 mcg by mouth daily.    [provider]  enoxaparin  (LOVENOX ) 30 MG/0.3ML injection Inject 0.3 mLs (30 mg total) into the skin every 12 (twelve) hours. 08/10/23   Barbarann Nest, MD  fluticasone  furoate-vilanterol (BREO ELLIPTA ) 100-25 MCG/ACT AEPB Inhale 1 puff into the lungs daily.    [provider]  furosemide  (LASIX ) 20 MG tablet Take 20 mg by mouth daily. 04/15/23   [provider]  iron  polysaccharides (NIFEREX) 150 MG capsule Take 1 capsule (150 mg total) by mouth daily. 05/21/24   Laurita Pillion, MD  loperamide  (IMODIUM ) 2 MG capsule Take 2 mg by mouth as needed for diarrhea or loose stools. 08/14/23   [provider]  Multiple Vitamin (MULTIVITAMIN WITH MINERALS) TABS tablet Take 1 tablet by mouth daily. 04/21/24   Awanda City, MD  pantoprazole  (PROTONIX ) 40 MG tablet Take 40 mg by mouth 2 (two) times daily. 01/01/24   [provider]  rosuvastatin  (CRESTOR ) 10 MG tablet TAKE 1 TABLET(10 MG) BY MOUTH DAILY 12/26/23   Donette City A, FNP  simethicone  (MYLICON) 80 MG chewable tablet Chew 1 tablet (80 mg total) by mouth every 6 (six) hours as needed for flatulence. 04/08/23   Wouk, Devaughn Sayres, MD  trimethoprim  (TRIMPEX ) 100 MG tablet Take 1 tablet (100 mg total)  by mouth at bedtime. 08/10/23   Barbarann Nest, MD    Physical Exam: Vitals:   07/31/24 1830 07/31/24 1930 07/31/24 2019 07/31/24 2020  BP: (!) 76/65 (!) 134/53  (!) 134/54  Pulse: 90 98  95  Resp: (!) 22 20  18   Temp:  (!) 101.2 F (38.4 C)  99 F (37.2 C)  TempSrc:  Oral    SpO2: 100% 100%  98%  Weight:   97.1 kg   Height:   4' 11 (1.499 m)    General: Not in acute distress HEENT:       Eyes: PERRL, EOMI, no jaundice       ENT: No discharge from the ears and nose, no pharynx injection, no tonsillar enlargement.        Neck: No JVD, no bruit, no mass felt. Heme: No neck lymph node enlargement. Cardiac: S1/S2, RRR, No murmurs, No gallops or rubs. Respiratory: No rales, wheezing, rhonchi or rubs. GI: Soft, nondistended, nontender, no rebound pain, no organomegaly, BS present. GU: s/p of left nephrostomy tube in place without surrounding erythema Ext: No  pitting leg edema bilaterally. 1+DP/PT pulse bilaterally. Musculoskeletal: No joint deformities, No joint redness or warmth, no limitation of ROM in spin. Skin: No rashes.  Neuro: Alert, oriented X3, cranial nerves II-XII grossly intact, moves all extremities normally.  Psych: Patient is not psychotic, no suicidal or hemocidal ideation.  Labs on Admission: I have personally reviewed following labs and imaging studies  CBC: Recent Labs  Lab 07/31/24 1512  WBC 10.8*  HGB 9.3*  HCT 31.3*  MCV 91.8  PLT 265   Basic Metabolic Panel: Recent Labs  Lab 07/31/24 1512  NA 137  K 5.2*  CL 104  CO2 21*  GLUCOSE 156*  BUN 20  CREATININE 1.39*  CALCIUM  9.2   GFR: Estimated Creatinine Clearance: 39.1 mL/min (A) (by C-G formula based on SCr of 1.39 mg/dL (H)). Liver Function Tests: Recent Labs  Lab 07/31/24 1512  AST 43*  ALT 13  ALKPHOS 63  BILITOT 1.2  PROT 6.7  ALBUMIN 3.3*   No results for input(s): LIPASE, AMYLASE in the last 168 hours. No results for input(s): AMMONIA in the last 168 hours. Coagulation Profile: Recent Labs  Lab 07/31/24 1511  INR 1.1   Cardiac Enzymes: No results for input(s): CKTOTAL, CKMB, CKMBINDEX, TROPONINI in the last 168 hours. BNP (last 3 results) Recent Labs    07/31/24 1511  PROBNP 510.0*   HbA1C: No results for input(s): HGBA1C in the last 72 hours. CBG: No results for input(s): GLUCAP in the last 168 hours. Lipid Profile: No results for input(s): CHOL, HDL, LDLCALC, TRIG, CHOLHDL, LDLDIRECT in the last 72 hours. Thyroid Function Tests: No results for input(s): TSH, T4TOTAL, FREET4, T3FREE, THYROIDAB in the last 72 hours. Anemia Panel: No results for input(s): VITAMINB12, FOLATE, FERRITIN, TIBC, IRON , RETICCTPCT in the last 72 hours. Urine analysis:    Component Value Date/Time   COLORURINE YELLOW (A) 07/31/2024 1723   APPEARANCEUR CLOUDY (A) 07/31/2024 1723   APPEARANCEUR  Cloudy (A) 01/03/2022 1443   LABSPEC 1.016 07/31/2024 1723   PHURINE 5.0 07/31/2024 1723   GLUCOSEU NEGATIVE 07/31/2024 1723   HGBUR MODERATE (A) 07/31/2024 1723   BILIRUBINUR NEGATIVE 07/31/2024 1723   BILIRUBINUR Negative 01/03/2022 1443   KETONESUR NEGATIVE 07/31/2024 1723   PROTEINUR 30 (A) 07/31/2024 1723   NITRITE POSITIVE (A) 07/31/2024 1723   LEUKOCYTESUR LARGE (A) 07/31/2024 1723   Sepsis Labs: @LABRCNTIP (procalcitonin:4,lacticidven:4) ) Recent Results (from  the past 240 hours)  Resp panel by RT-PCR (RSV, Flu A&B, Covid) Anterior Nasal Swab     Status: None   Collection Time: 07/31/24  3:12 PM   Specimen: Anterior Nasal Swab  Result Value Ref Range Status   SARS Coronavirus 2 by RT PCR NEGATIVE NEGATIVE Final    Comment: (NOTE) SARS-CoV-2 target nucleic acids are NOT DETECTED.  The SARS-CoV-2 RNA is generally detectable in upper respiratory specimens during the acute phase of infection. The lowest concentration of SARS-CoV-2 viral copies this assay can detect is 138 copies/mL. A negative result does not preclude SARS-Cov-2 infection and should not be used as the sole basis for treatment or other patient management decisions. A negative result may occur with  improper specimen collection/handling, submission of specimen other than nasopharyngeal swab, presence of viral mutation(s) within the areas targeted by this assay, and inadequate number of viral copies(<138 copies/mL). A negative result must be combined with clinical observations, patient history, and epidemiological information. The expected result is Negative.  Fact Sheet for Patients:  bloggercourse.com  Fact Sheet for Healthcare Providers:  seriousbroker.it  This test is no t yet approved or cleared by the [place]  FDA and  has been authorized for detection and/or diagnosis of SARS-CoV-2 by FDA under an Emergency Use Authorization (EUA). This EUA will  remain  in effect (meaning this test can be used) for the duration of the COVID-19 declaration under Section 564(b)(1) of the Act, 21 U.S.C.section 360bbb-3(b)(1), unless the authorization is terminated  or revoked sooner.       Influenza A by PCR NEGATIVE NEGATIVE Final   Influenza B by PCR NEGATIVE NEGATIVE Final    Comment: (NOTE) The Xpert Xpress SARS-CoV-2/FLU/RSV plus assay is intended as an aid in the diagnosis of influenza from Nasopharyngeal swab specimens and should not be used as a sole basis for treatment. Nasal washings and aspirates are unacceptable for Xpert Xpress SARS-CoV-2/FLU/RSV testing.  Fact Sheet for Patients: bloggercourse.com  Fact Sheet for Healthcare Providers: seriousbroker.it  This test is not yet approved or cleared by the [place]  FDA and has been authorized for detection and/or diagnosis of SARS-CoV-2 by FDA under an Emergency Use Authorization (EUA). This EUA will remain in effect (meaning this test can be used) for the duration of the COVID-19 declaration under Section 564(b)(1) of the Act, 21 U.S.C. section 360bbb-3(b)(1), unless the authorization is terminated or revoked.     Resp Syncytial Virus by PCR NEGATIVE NEGATIVE Final    Comment: (NOTE) Fact Sheet for Patients: bloggercourse.com  Fact Sheet for Healthcare Providers: seriousbroker.it  This test is not yet approved or cleared by the [place]  FDA and has been authorized for detection and/or diagnosis of SARS-CoV-2 by FDA under an Emergency Use Authorization (EUA). This EUA will remain in effect (meaning this test can be used) for the duration of the COVID-19 declaration under Section 564(b)(1) of the Act, 21 U.S.C. section 360bbb-3(b)(1), unless the authorization is terminated or revoked.  Performed at [facility], [address], [place], [place] [address]       Radiological Exams on Admission:   Assessment/Plan Principal Problem:   Complicated UTI (urinary tract infection) Active Problems:   AKI (acute kidney injury)   Hyperkalemia   Chronic diastolic CHF (congestive heart failure) (HCC)   COPD (chronic obstructive pulmonary disease) (HCC)   Hypertension   Dyslipidemia   HLD (hyperlipidemia)   Recurrent deep vein thrombosis (DVT) (HCC)   History of pulmonary embolism/DVT s/p IVC filter  Iron  deficiency anemia   Obesity, Class III, BMI 40-49.9 (morbid obesity) (HCC)   Assessment and Plan:   Complicated UTI (urinary tract infection): Patient has elevated lactic acid of 3.1 --> 2.4.  Patient is at high risk for developing sepsis.  Patient has WBC 10.8, temperature normal, heart rate 96 -- > 84, RR 24 --> 16.   - Admitted to telemetry bed as inpatient - IV meropenem  due to history of ESBL (patient received 1 dose of Rocephin  and azithromycin  in ED) - Follow-up blood culture and urine culture - IV fluid: 3 L LR, Naima 75 cc/h - Trend lactic acid level  AKI (acute kidney injury): Baseline creatinine 0.65 on 05/20/2024.  Her creatinine is 1.39, BUN 20, GFR 41 today.  Likely due to UTI. -Hold Lasix  - IV fluid as above  Hyperkalemia: Potassium 5.2. -IV fluid as above - 10 g leukoma  Chronic diastolic CHF (congestive heart failure) (HCC): 2D echo on 11/21/2022 showed EF of 60-85% with grade 1 diastolic dysfunction.  Patient does not have leg edema.  CHF seems to be compensated. -Hold Lasix  due to AKI  COPD (chronic obstructive pulmonary disease) [facility]): Patient reports shortness of breath earlier, which has improved in ED.  No oxygen desaturation.  PCR negative for COVID, flu and RSV.  Chest x-ray negative.  BNP 510, no leg edema, does not seem to have CHF exacerbation. -Bronchodilators as needed Mucinex   Hypertension: Blood pressure is soft -Lasix  on hold - IV hydralazine  as needed  HLD: -Crestor   Recurrent deep vein  thrombosis (DVT) (HCC) and History of pulmonary embolism/DVT s/p IVC filter: - Continue home  sq Lovenox  30 g twice daily  Iron  deficiency anemia: Hemoglobin stable 9.3 (8.5 on 05/20/2024) - Continue iron  supplement  Obesity, Class III, BMI 40-49.9 (morbid obesity) (HCC): Patient has Obesity Class III, with body weight  97.1 Kg and BMI 43.24 kg/m2.  - Encourage losing weight - Exercise and healthy diet       DVT ppx: on SQ Lovenox   Code Status: Full code     Family Communication:     not done, no family member is at bed side.     Disposition Plan:  Anticipate discharge back to previous environment  Consults called:    Admission status and Level of care: Telemetry:    for obs as inpt        Dispo: The patient is from: Home              Anticipated d/c is to: Home              Anticipated d/c date is: 2 days              Patient currently is not medically stable to d/c.    Severity of Illness:  The appropriate patient status for this patient is INPATIENT. Inpatient status is judged to be reasonable and necessary in order to provide the required intensity of service to ensure the patient's safety. The patient's presenting symptoms, physical exam findings, and initial radiographic and laboratory data in the context of their chronic comorbidities is felt to place them at high risk for further clinical deterioration. Furthermore, it is not anticipated that the patient will be medically stable for discharge from the hospital within 2 midnights of admission.   * I certify that at the point of admission it is my clinical judgment that the patient will require inpatient hospital care spanning beyond 2 midnights from the point of admission due to  high intensity of service, high risk for further deterioration and high frequency of surveillance required.*       Date of Service 07/31/2024    Caleb Exon [facility]   If 7PM-7AM, please contact  night-coverage [email or web address] 07/31/2024, 8:41 PM     [1] No Known Allergies  "

## 2024-07-31 NOTE — ED Provider Notes (Signed)
 "  [facility] Provider Note   Event Date/Time   First MD Initiated Contact with Patient 07/31/24 1507     (approximate) History  Weakness and Shortness of Breath  HPI Courtney Mack is a 69 y.o. female with a past medical history of CHF with acute respiratory failure not on oxygen at home, type 2 diabetes, recurrent kidney infections with left ureteral stent in place who presents complaining of shortness of breath.  Patient states that she was recently treated for pyelonephritis in November of last year and has had a stent in since that time.  Patient states that her stent was replaced at that time that she has had it for almost 2 years.  Patient does endorse dysuria in addition to her shortness of breath and dyspnea on exertion.  Patient endorses subjective fever as well as sore throat.  The symptoms began this morning.  Patient arrived via EMS with stable vital signs throughout transport ROS: Patient currently denies any vision changes, tinnitus, difficulty speaking, facial droop, sore throat, chest pain, abdominal pain, nausea/vomiting/diarrhea, dysuria, or numbness/paresthesias in any extremity   Physical Exam  Triage Vital Signs: ED Triage Vitals [07/31/24 1504]  Encounter Vitals Group     BP      Girls Systolic BP Percentile      Girls Diastolic BP Percentile      Boys Systolic BP Percentile      Boys Diastolic BP Percentile      Pulse      Resp      Temp      Temp src      SpO2      Weight 202 lb 6.1 oz (91.8 kg)     Height 4' 11 (1.499 m)     Head Circumference      Peak Flow      Pain Score 4     Pain Loc      Pain Education      Exclude from Growth Chart    Most recent vital signs: Vitals:   07/31/24 1930 07/31/24 2020  BP: (!) 134/53 (!) 134/54  Pulse: 98 95  Resp: 20 18  Temp: (!) 101.2 F (38.4 C) 99 F (37.2 C)  SpO2: 100% 98%   General: Awake, oriented x4. CV:  Good peripheral perfusion. Resp:  Normal effort.  Referred upper  airway breath sounds. Abd:  No distention. Other:  Middle-aged morbidly obese Caucasian female resting comfortably in no acute distress.  Erythematous posterior oropharynx ED Results / Procedures / Treatments  Labs (all labs ordered are listed, but only abnormal results are displayed) Labs Reviewed  COMPREHENSIVE METABOLIC PANEL WITH GFR - Abnormal; Notable for the following components:      Result Value   Potassium 5.2 (*)    CO2 21 (*)    Glucose, Bld 156 (*)    Creatinine, Ser 1.39 (*)    Albumin 3.3 (*)    AST 43 (*)    GFR, Estimated 41 (*)    All other components within normal limits  CBC - Abnormal; Notable for the following components:   WBC 10.8 (*)    RBC 3.41 (*)    Hemoglobin 9.3 (*)    HCT 31.3 (*)    MCHC 29.7 (*)    RDW 18.9 (*)    All other components within normal limits  URINALYSIS, ROUTINE W REFLEX MICROSCOPIC - Abnormal; Notable for the following components:   Color, Urine YELLOW (*)    APPearance  CLOUDY (*)    Hgb urine dipstick MODERATE (*)    Protein, ur 30 (*)    Nitrite POSITIVE (*)    Leukocytes,Ua LARGE (*)    Bacteria, UA RARE (*)    All other components within normal limits  PRO BRAIN NATRIURETIC PEPTIDE - Abnormal; Notable for the following components:   Pro Brain Natriuretic Peptide 510.0 (*)    All other components within normal limits  LACTIC ACID, PLASMA - Abnormal; Notable for the following components:   Lactic Acid, Venous 3.1 (*)    All other components within normal limits  LACTIC ACID, PLASMA - Abnormal; Notable for the following components:   Lactic Acid, Venous 2.4 (*)    All other components within normal limits  PROTIME-INR - Abnormal; Notable for the following components:   Prothrombin  Time 15.3 (*)    All other components within normal limits  RESP PANEL BY RT-PCR (RSV, FLU A&B, COVID)  [id number]  CULTURE, BLOOD (ROUTINE X 2)  CULTURE, BLOOD (ROUTINE X 2)  URINE CULTURE  LACTIC ACID, PLASMA  LACTIC ACID, PLASMA  LACTIC  ACID, PLASMA  BASIC METABOLIC PANEL WITH GFR  CBC  CBG MONITORING, ED  TROPONIN T, HIGH SENSITIVITY  TROPONIN T, HIGH SENSITIVITY   EKG ED ECG REPORT I, Artist MARLA Kerns, the attending physician, personally viewed and interpreted this ECG. Date: 07/31/2024 EKG Time: 1513 Rate: 92 Rhythm: normal sinus rhythm QRS Axis: normal Intervals: normal ST/T Wave abnormalities: normal Narrative Interpretation: no evidence of acute ischemia RADIOLOGY ED MD interpretation: One-view portable chest x-ray interpreted by me shows no evidence of acute abnormalities including no pneumonia, pneumothorax, or widened mediastinum - All radiology independently interpreted and agree with radiology assessment Official radiology report(s): DG Chest Port 1 View Result Date: 07/31/2024 EXAM: 1 VIEW(S) XRAY OF THE CHEST 07/31/2024 03:30:16 PM COMPARISON: 05/14/2024 CLINICAL HISTORY: Shortness of breath. FINDINGS: LUNGS AND PLEURA: Low lung volumes. No focal pulmonary opacity. No pleural effusion. No pneumothorax. HEART AND MEDIASTINUM: Aortic atherosclerotic calcification. No acute abnormality of the cardiac and mediastinal silhouettes. BONES AND SOFT TISSUES: Degenerative changes in the spine and shoulders. IMPRESSION: 1. No acute findings. 2. Low lung volumes. Electronically signed by: Elsie Gravely MD 07/31/2024 03:44 PM EST RP Workstation: [id number]   PROCEDURES: Critical Care performed: No Procedures MEDICATIONS ORDERED IN ED: Medications  lactated ringers  infusion ( Intravenous New Bag/Given 07/31/24 2029)  albuterol  (PROVENTIL ) (2.5 MG/3ML) 0.083% nebulizer solution 2.5 mg (has no administration in time range)  dextromethorphan -guaiFENesin  (MUCINEX  DM) 30-600 MG per 12 hr tablet 1 tablet (has no administration in time range)  ondansetron  (ZOFRAN ) injection 4 mg (has no administration in time range)  acetaminophen  (TYLENOL ) tablet 650 mg (650 mg Oral Given 07/31/24 1935)  meropenem  (MERREM ) 1 g in sodium  chloride 0.9 % 100 mL IVPB (1 g Intravenous New Bag/Given 07/31/24 2250)  rosuvastatin  (CRESTOR ) tablet 10 mg (10 mg Oral Given 07/31/24 2030)  loperamide  (IMODIUM ) capsule 2 mg (has no administration in time range)  pantoprazole  (PROTONIX ) EC tablet 40 mg (40 mg Oral Given 07/31/24 2251)  cyanocobalamin  (VITAMIN B12) tablet 500 mcg (500 mcg Oral Given 07/31/24 2030)  enoxaparin  (LOVENOX ) injection 30 mg (30 mg Subcutaneous Given 07/31/24 2251)  iron  polysaccharides (NIFEREX) capsule 150 mg (has no administration in time range)  ascorbic acid  (VITAMIN C ) tablet 1,000 mg (has no administration in time range)  multivitamin with minerals tablet 1 tablet (1 tablet Oral Given 07/31/24 2030)  fluticasone  furoate-vilanterol (BREO ELLIPTA ) 100-25 MCG/ACT 1 puff (1 puff  Inhalation Given 07/31/24 2253)  lactated ringers  bolus 2,000 mL (0 mLs Intravenous Stopped 07/31/24 1702)  cefTRIAXone  (ROCEPHIN ) 2 g in sodium chloride  0.9 % 100 mL IVPB (0 g Intravenous Stopped 07/31/24 1619)  azithromycin  (ZITHROMAX ) 500 mg in sodium chloride  0.9 % 250 mL IVPB (0 mg Intravenous Stopped 07/31/24 1724)  lactated ringers  bolus 1,000 mL (0 mLs Intravenous Stopped 07/31/24 1728)  sodium zirconium cyclosilicate  (LOKELMA ) packet 10 g (10 g Oral Given 07/31/24 2252)   IMPRESSION / MDM / ASSESSMENT AND PLAN / ED COURSE  I reviewed the triage vital signs and the nursing notes.                             The patient is on the cardiac monitor to evaluate for evidence of arrhythmia and/or significant heart rate changes. Patient's presentation is most consistent with acute presentation with potential threat to life or bodily function. Patient is a 69 year old female with above-stated past medical history who presents complaining of shortness of breath and generalized weakness in the setting of a ureteral stent, dysuria, and recurrent kidney infections DDx: Recurrent pyelonephritis, upper respiratory infection, pneumonia, CHF  exacerbation Plan: CBC, CMP, troponin, BNP, culture, urinalysis, EKG, chest x-ray  Patient's laboratory evaluation significant for likely recurrent urinary tract infection and given ureteral stent will require admission given the high likelihood of pyelonephritis.  Patient treated empirically with Rocephin .  Dispo: Admit to medicine   FINAL CLINICAL IMPRESSION(S) / ED DIAGNOSES   Final diagnoses:  Sepsis without acute organ dysfunction, due to unspecified organism [facility])  Urinary tract infection with hematuria, site unspecified   Rx / DC Orders   ED Discharge Orders     None      Note:  This document was prepared using Dragon voice recognition software and may include unintentional dictation errors.   Thresa Dozier K, MD 07/31/24 [phone]  "

## 2024-07-31 NOTE — Consult Note (Signed)
 CODE SEPSIS - PHARMACY COMMUNICATION  **Broad Spectrum Antibiotics should be administered within 1 hour of Sepsis diagnosis**  Time Code Sepsis Called/Page Received: 1540  Antibiotics Ordered: azithromycin  and rocephin   Time of 1st antibiotic administration: 1549  Additional action taken by pharmacy: none  If necessary, Name of Provider/Nurse Contacted: n/a    Annabella LOISE Banks ,PharmD Clinical Pharmacist  07/31/2024  3:54 PM

## 2024-08-01 ENCOUNTER — Inpatient Hospital Stay

## 2024-08-01 DIAGNOSIS — N390 Urinary tract infection, site not specified: Secondary | ICD-10-CM | POA: Diagnosis not present

## 2024-08-01 LAB — BLOOD CULTURE ID PANEL (REFLEXED) - BCID2

## 2024-08-01 LAB — CBC
HCT: 29 % — ABNORMAL LOW (ref 36.0–46.0)
Hemoglobin: 8.7 g/dL — ABNORMAL LOW (ref 12.0–15.0)
MCH: 28.1 pg (ref 26.0–34.0)
MCHC: 30 g/dL (ref 30.0–36.0)
MCV: 93.5 fL (ref 80.0–100.0)
Platelets: 247 10*3/uL (ref 150–400)
RBC: 3.1 MIL/uL — ABNORMAL LOW (ref 3.87–5.11)
RDW: 18.6 % — ABNORMAL HIGH (ref 11.5–15.5)
WBC: 11 10*3/uL — ABNORMAL HIGH (ref 4.0–10.5)
nRBC: 0 % (ref 0.0–0.2)

## 2024-08-01 LAB — BASIC METABOLIC PANEL WITH GFR
Anion gap: 9 (ref 5–15)
BUN: 19 mg/dL (ref 8–23)
CO2: 24 mmol/L (ref 22–32)
Calcium: 8.9 mg/dL (ref 8.9–10.3)
Chloride: 105 mmol/L (ref 98–111)
Creatinine, Ser: 1.34 mg/dL — ABNORMAL HIGH (ref 0.44–1.00)
GFR, Estimated: 43 mL/min — ABNORMAL LOW
Glucose, Bld: 128 mg/dL — ABNORMAL HIGH (ref 70–99)
Potassium: 4.5 mmol/L (ref 3.5–5.1)
Sodium: 137 mmol/L (ref 135–145)

## 2024-08-01 LAB — LACTIC ACID, PLASMA: Lactic Acid, Venous: 1.2 mmol/L (ref 0.5–1.9)

## 2024-08-01 MED ORDER — ZINC OXIDE 40 % EX OINT
TOPICAL_OINTMENT | Freq: Two times a day (BID) | CUTANEOUS | Status: DC
  Administered 2024-08-03 – 2024-08-06 (×3): 1 via TOPICAL
  Filled 2024-08-01: qty 113

## 2024-08-01 NOTE — Progress Notes (Signed)
 PT Cancellation Note  Patient Details Name: YARISA LYNAM MRN: [id number] DOB: 10/20/1955   Cancelled Treatment:    Reason Eval/Treat Not Completed: Patient declined, no reason specified (Consult received and chart reviewed.  Patient politely declining session due to general malaise, today is not a good day. Unable to redirect or facilitate despite education; encouragement; will continue efforts next date as appopriate.)  Tilley Faeth H. Delores, PT, DPT, NCS 08/01/24, 12:18 PM [phone]

## 2024-08-01 NOTE — Plan of Care (Signed)
  Problem: Clinical Measurements: Goal: Respiratory complications will improve Outcome: Progressing Goal: Cardiovascular complication will be avoided Outcome: Progressing   Problem: Coping: Goal: Level of anxiety will decrease Outcome: Progressing   Problem: Elimination: Goal: Will not experience complications related to urinary retention Outcome: Progressing   Problem: Pain Managment: Goal: General experience of comfort will improve and/or be controlled Outcome: Progressing   Problem: Safety: Goal: Ability to remain free from injury will improve Outcome: Progressing

## 2024-08-01 NOTE — Consult Note (Signed)
" °  CLINICAL SUPPORT TEAM - WOUND OSTOMY AND CONTINENCE TEAM  CONSULTATION SERVICES   WOC Nurse-Inpatient Note  WOC Nurse Consult Note: Reason for Consult: buttocks ? Deep tissue pressure injury  Wound type: buttocks appear with widespread chronic tissue damage (dark dusky tissue from prolonged pressure); ? Deep tissue pressure injury intergluteal cleft/coccyx purple maroon discoloration  Pressure Injury POA: Yes Measurement: see nursing flowsheet  Wound bed: as above; some scattered areas of partial thickness skin loss to buttocks  Drainage (amount, consistency, odor)  Periwound: Dressing procedure/placement/frequency: Cleanse buttocks/intergluteal cleft with soap and water, dry and apply a thin layer of Desitin 2 times a day and prn soiling.   Turn patient q2 hours.  If patient up to chair should have pressure redistribution pad in place ([phone]#[id number]).  POC discussed with bedside nurse. WOC team will not follow. Reconsult if further needs arise.   Thank you,    Sheli Dorin MSN, RN-BC, CWOCN     "

## 2024-08-01 NOTE — Progress Notes (Signed)
 PHARMACY - PHYSICIAN COMMUNICATION CRITICAL VALUE ALERT - BLOOD CULTURE IDENTIFICATION (BCID)  Courtney Mack is an 69 y.o. female who presented to [facility] on 07/31/2024 with a chief complaint of dysuria and shortness of breath  Assessment:  1 of 4(aerobic) enterobacterales, e.coli, CTX-M resistance detected  Name of physician (or Provider) Contacted: Sreenath  Current antibiotics: Meropenem   Changes to prescribed antibiotics recommended:  Patient is on recommended antibiotics - No changes needed  Results for orders placed or performed during the hospital encounter of 07/31/24  Blood Culture ID Panel (Reflexed) (Collected: 07/31/2024  3:12 PM)  Result Value Ref Range   Enterococcus faecalis NOT DETECTED NOT DETECTED   Enterococcus Faecium NOT DETECTED NOT DETECTED   Listeria monocytogenes NOT DETECTED NOT DETECTED   Staphylococcus species NOT DETECTED NOT DETECTED   Staphylococcus aureus (BCID) NOT DETECTED NOT DETECTED   Staphylococcus epidermidis NOT DETECTED NOT DETECTED   Staphylococcus lugdunensis NOT DETECTED NOT DETECTED   Streptococcus species NOT DETECTED NOT DETECTED   Streptococcus agalactiae NOT DETECTED NOT DETECTED   Streptococcus pneumoniae NOT DETECTED NOT DETECTED   Streptococcus pyogenes NOT DETECTED NOT DETECTED   A.calcoaceticus-baumannii NOT DETECTED NOT DETECTED   Bacteroides fragilis NOT DETECTED NOT DETECTED   Enterobacterales DETECTED (A) NOT DETECTED   Enterobacter cloacae complex NOT DETECTED NOT DETECTED   Escherichia coli DETECTED (A) NOT DETECTED   Klebsiella aerogenes NOT DETECTED NOT DETECTED   Klebsiella oxytoca NOT DETECTED NOT DETECTED   Klebsiella pneumoniae NOT DETECTED NOT DETECTED   Proteus species NOT DETECTED NOT DETECTED   Salmonella species NOT DETECTED NOT DETECTED   Serratia marcescens NOT DETECTED NOT DETECTED   Haemophilus influenzae NOT DETECTED NOT DETECTED   Neisseria meningitidis NOT DETECTED NOT DETECTED   Pseudomonas  aeruginosa NOT DETECTED NOT DETECTED   Stenotrophomonas maltophilia NOT DETECTED NOT DETECTED   Candida albicans NOT DETECTED NOT DETECTED   Candida auris NOT DETECTED NOT DETECTED   Candida glabrata NOT DETECTED NOT DETECTED   Candida krusei NOT DETECTED NOT DETECTED   Candida parapsilosis NOT DETECTED NOT DETECTED   Candida tropicalis NOT DETECTED NOT DETECTED   Cryptococcus neoformans/gattii NOT DETECTED NOT DETECTED   CTX-M ESBL DETECTED (A) NOT DETECTED   Carbapenem resistance IMP NOT DETECTED NOT DETECTED   Carbapenem resistance KPC NOT DETECTED NOT DETECTED   Carbapenem resistance NDM NOT DETECTED NOT DETECTED   Carbapenem resist OXA 48 LIKE NOT DETECTED NOT DETECTED   Carbapenem resistance VIM NOT DETECTED NOT DETECTED    [name] 08/01/2024  11:53 AM

## 2024-08-01 NOTE — Plan of Care (Signed)

## 2024-08-01 NOTE — Progress Notes (Signed)
 " PROGRESS NOTE    Courtney Mack  FMW:[id number] DOB: 1956/04/12 DOA: 07/31/2024 PCP: [facility]    Brief Narrative:  69 y.o. female with medical history significant of left-sided hydronephrosis status post nephrostomy tube placement, recurrent UTI and sepsis due to UTI, ESBL, hypertension, COPD, history of extensive bilateral PE and DVT (s/p of IVC filter, on lovenox ), dCHF, who presents with weakness, dysuria and shortness of breath.   Patient states that she she feels fatigued and tired today.  She has dysuria and burning on urination.  No urinary frequency and gross hematuria.  Patient has chills, no fever.  She also reports SOB on exertion earlier, which has resolved currently.  Patient has mild dry cough, no chest pain.  No sore throat or runny nose.  Patient does not have nausea vomiting, diarrhea or abdominal pain.  No respiratory distress.  Assessment & Plan:   Principal Problem:   Complicated UTI (urinary tract infection) Active Problems:   AKI (acute kidney injury)   Hyperkalemia   Chronic diastolic CHF (congestive heart failure) (HCC)   COPD (chronic obstructive pulmonary disease) (HCC)   Hypertension   HLD (hyperlipidemia)   Recurrent deep vein thrombosis (DVT) (HCC)   History of pulmonary embolism/DVT s/p IVC filter   Iron  deficiency anemia   Obesity, Class III, BMI 40-49.9 (morbid obesity) (HCC)  Complicated UTI (urinary tract infection):  Patient has elevated lactic acid of 3.1 --> 2.4.  Patient is at high risk for developing sepsis.  Patient has WBC 10.8, fever noted 1/25 Plan: Continue IV meropenem  Follow blood and urine cultures  continue IV fluids Check CT abdomen pelvis to assess for stone versus malpositioned nephrostomy Monitor vitals and fever curve  AKI (acute kidney injury):  Baseline creatinine 0.65 on 05/20/2024.  Her creatinine is 1.39, BUN 20, GFR 41 today.  Likely due to UTI. Plan: Lasix  held IVF  Hyperkalemia,  resolved  Potassium 5.2. Responded to IV fluids and Lokelma    Chronic diastolic CHF (congestive heart failure) (HCC):  2D echo on 11/21/2022 showed EF of 60-65% with grade 1 diastolic dysfunction.  Patient does not have leg edema.  CHF seems to be compensated. Plan: Lasix  on hold Monitor volume status following fluids   COPD (chronic obstructive pulmonary disease) [facility]):  Patient reports shortness of breath earlier, which has improved in ED.  No oxygen desaturation.  PCR negative for COVID, flu and RSV.  Chest x-ray negative.  BNP 510, no leg edema, does not seem to have COPD exacerbation. Plan: Bronchodilators as needed Mucinex    Hypertension:  Blood pressure is soft Plan: Hold Lasix    HLD: Continue Crestor    Recurrent deep vein thrombosis (DVT) (HCC) and History of pulmonary embolism/DVT s/p IVC filter: Home weight-based Lovenox    Iron  deficiency anemia:  Hemoglobin stable 9.3 (8.5 on 05/20/2024) P.o. iron    Obesity, Class III, BMI 40-49.9 (morbid obesity) (HCC):  Patient has Obesity Class III, with body weight  97.1 Kg and BMI 43.24 kg/m2.  Complicating factor in overall care and prognosis   DVT prophylaxis: Lovenox  Code Status: Full Family Communication: None Disposition Plan: Status is: Inpatient Remains inpatient appropriate because: Sepsis secondary to UTI   Level of care: Telemetry  Consultants:  None  Procedures:  None  Antimicrobials: Meropenem    Subjective: Seen and examined.  Resting in bed.  No visible distress appears fatigued  Objective: Vitals:   07/31/24 1930 07/31/24 2019 07/31/24 2020 08/01/24 0500  BP: (!) 134/53  (!) 134/54 126/79  Pulse:  98  95 90  Resp: 20  18 17   Temp: (!) 101.2 F (38.4 C)  99 F (37.2 C) 98.9 F (37.2 C)  TempSrc: Oral  Oral Oral  SpO2: 100%  98% 98%  Weight:  97.1 kg  97 kg  Height:  4' 11 (1.499 m)      Intake/Output Summary (Last 24 hours) at 08/01/2024 1048 Last data filed at 08/01/2024 0616 Gross  per 24 hour  Intake 1642.36 ml  Output --  Net 1642.36 ml   Filed Weights   07/31/24 1504 07/31/24 2019 08/01/24 0500  Weight: 91.8 kg 97.1 kg 97 kg    Examination:  General exam: Fatigued, chronically ill-appearing Respiratory system: Clear to auscultation. Respiratory effort normal. Cardiovascular system: S1 S2, RRR, no murmurs, no pedal edema Gastrointestinal system: Soft, NT/ND, normal bowel sounds, nephrostomy in place Central nervous system: Alert and oriented. No focal neurological deficits. Extremities: Symmetric 5 x 5 power. Skin: No rashes, lesions or ulcers Psychiatry: Judgement and insight appear normal. Mood & affect appropriate.     Data Reviewed: I have personally reviewed following labs and imaging studies  CBC: Recent Labs  Lab 07/31/24 1512 08/01/24 0508  WBC 10.8* 11.0*  HGB 9.3* 8.7*  HCT 31.3* 29.0*  MCV 91.8 93.5  PLT 265 247   Basic Metabolic Panel: Recent Labs  Lab 07/31/24 1512 08/01/24 0508  NA 137 137  K 5.2* 4.5  CL 104 105  CO2 21* 24  GLUCOSE 156* 128*  BUN 20 19  CREATININE 1.39* 1.34*  CALCIUM  9.2 8.9   GFR: Estimated Creatinine Clearance: 40.5 mL/min (A) (by C-G formula based on SCr of 1.34 mg/dL (H)). Liver Function Tests: Recent Labs  Lab 07/31/24 1512  AST 43*  ALT 13  ALKPHOS 63  BILITOT 1.2  PROT 6.7  ALBUMIN 3.3*   No results for input(s): LIPASE, AMYLASE in the last 168 hours. No results for input(s): AMMONIA in the last 168 hours. Coagulation Profile: Recent Labs  Lab 07/31/24 1511  INR 1.1   Cardiac Enzymes: No results for input(s): CKTOTAL, CKMB, CKMBINDEX, TROPONINI in the last 168 hours. BNP (last 3 results) Recent Labs    07/31/24 1511  PROBNP 510.0*   HbA1C: No results for input(s): HGBA1C in the last 72 hours. CBG: No results for input(s): GLUCAP in the last 168 hours. Lipid Profile: No results for input(s): CHOL, HDL, LDLCALC, TRIG, CHOLHDL, LDLDIRECT in  the last 72 hours. Thyroid Function Tests: No results for input(s): TSH, T4TOTAL, FREET4, T3FREE, THYROIDAB in the last 72 hours. Anemia Panel: No results for input(s): VITAMINB12, FOLATE, FERRITIN, TIBC, IRON , RETICCTPCT in the last 72 hours. Sepsis Labs: Recent Labs  Lab 07/31/24 1735 07/31/24 2020 07/31/24 2235 08/01/24 0110  LATICACIDVEN 2.4* 1.8 1.1 1.2    Recent Results (from the past 240 hours)  Culture, blood (routine x 2)     Status: None (Preliminary result)   Collection Time: 07/31/24  3:12 PM   Specimen: BLOOD  Result Value Ref Range Status   Specimen Description BLOOD  Final   Special Requests   Final    BOTTLES DRAWN AEROBIC AND ANAEROBIC Blood Culture results may not be optimal due to an inadequate volume of blood received in culture bottles   Culture  Setup Time   Final    Organism ID to follow GRAM NEGATIVE RODS AEROBIC BOTTLE ONLY CRITICAL RESULT CALLED TO, READ BACK BY AND VERIFIED WITH: WALID NAZARI ON 08/01/24 AT 1033 QSD Performed at [facility]  Lab, [address], [place], [place] [address]    Culture GRAM NEGATIVE RODS  Final   Report Status PENDING  Incomplete  Culture, blood (routine x 2)     Status: None (Preliminary result)   Collection Time: 07/31/24  3:12 PM   Specimen: BLOOD  Result Value Ref Range Status   Specimen Description BLOOD  Final   Special Requests   Final    BOTTLES DRAWN AEROBIC AND ANAEROBIC Blood Culture results may not be optimal due to an inadequate volume of blood received in culture bottles   Culture   Final    NO GROWTH < 24 HOURS Performed at [facility], [address], [place], [place] [address]    Report Status PENDING  Incomplete  Resp panel by RT-PCR (RSV, Flu A&B, Covid) Anterior Nasal Swab     Status: None   Collection Time: 07/31/24  3:12 PM   Specimen: Anterior Nasal Swab  Result Value Ref Range Status   SARS Coronavirus 2 by RT PCR NEGATIVE NEGATIVE Final    Comment:  (NOTE) SARS-CoV-2 target nucleic acids are NOT DETECTED.  The SARS-CoV-2 RNA is generally detectable in upper respiratory specimens during the acute phase of infection. The lowest concentration of SARS-CoV-2 viral copies this assay can detect is 138 copies/mL. A negative result does not preclude SARS-Cov-2 infection and should not be used as the sole basis for treatment or other patient management decisions. A negative result may occur with  improper specimen collection/handling, submission of specimen other than nasopharyngeal swab, presence of viral mutation(s) within the areas targeted by this assay, and inadequate number of viral copies(<138 copies/mL). A negative result must be combined with clinical observations, patient history, and epidemiological information. The expected result is Negative.  Fact Sheet for Patients:  bloggercourse.com  Fact Sheet for Healthcare Providers:  seriousbroker.it  This test is no t yet approved or cleared by the [place]  FDA and  has been authorized for detection and/or diagnosis of SARS-CoV-2 by FDA under an Emergency Use Authorization (EUA). This EUA will remain  in effect (meaning this test can be used) for the duration of the COVID-19 declaration under Section 564(b)(1) of the Act, 21 U.S.C.section 360bbb-3(b)(1), unless the authorization is terminated  or revoked sooner.       Influenza A by PCR NEGATIVE NEGATIVE Final   Influenza B by PCR NEGATIVE NEGATIVE Final    Comment: (NOTE) The Xpert Xpress SARS-CoV-2/FLU/RSV plus assay is intended as an aid in the diagnosis of influenza from Nasopharyngeal swab specimens and should not be used as a sole basis for treatment. Nasal washings and aspirates are unacceptable for Xpert Xpress SARS-CoV-2/FLU/RSV testing.  Fact Sheet for Patients: bloggercourse.com  Fact Sheet for Healthcare  Providers: seriousbroker.it  This test is not yet approved or cleared by the [place]  FDA and has been authorized for detection and/or diagnosis of SARS-CoV-2 by FDA under an Emergency Use Authorization (EUA). This EUA will remain in effect (meaning this test can be used) for the duration of the COVID-19 declaration under Section 564(b)(1) of the Act, 21 U.S.C. section 360bbb-3(b)(1), unless the authorization is terminated or revoked.     Resp Syncytial Virus by PCR NEGATIVE NEGATIVE Final    Comment: (NOTE) Fact Sheet for Patients: bloggercourse.com  Fact Sheet for Healthcare Providers: seriousbroker.it  This test is not yet approved or cleared by the [place]  FDA and has been authorized for detection and/or diagnosis of SARS-CoV-2 by FDA under an Emergency Use Authorization (EUA).  This EUA will remain in effect (meaning this test can be used) for the duration of the COVID-19 declaration under Section 564(b)(1) of the Act, 21 U.S.C. section 360bbb-3(b)(1), unless the authorization is terminated or revoked.  Performed at [facility], [address] [place], [place] [address]   Blood Culture ID Panel (Reflexed)     Status: Abnormal   Collection Time: 07/31/24  3:12 PM  Result Value Ref Range Status   Enterococcus faecalis NOT DETECTED NOT DETECTED Final   Enterococcus Faecium NOT DETECTED NOT DETECTED Final   Listeria monocytogenes NOT DETECTED NOT DETECTED Final   Staphylococcus species NOT DETECTED NOT DETECTED Final   Staphylococcus aureus (BCID) NOT DETECTED NOT DETECTED Final   Staphylococcus epidermidis NOT DETECTED NOT DETECTED Final   Staphylococcus lugdunensis NOT DETECTED NOT DETECTED Final   Streptococcus species NOT DETECTED NOT DETECTED Final   Streptococcus agalactiae NOT DETECTED NOT DETECTED Final   Streptococcus pneumoniae NOT DETECTED NOT DETECTED Final    Streptococcus pyogenes NOT DETECTED NOT DETECTED Final   A.calcoaceticus-baumannii NOT DETECTED NOT DETECTED Final   Bacteroides fragilis NOT DETECTED NOT DETECTED Final   Enterobacterales DETECTED (A) NOT DETECTED Final    Comment: Enterobacterales represent a large order of gram negative bacteria, not a single organism. CRITICAL RESULT CALLED TO, READ BACK BY AND VERIFIED WITH: WALID NAZARI ON 08/01/24 AT 1033 QSD    Enterobacter cloacae complex NOT DETECTED NOT DETECTED Final   Escherichia coli DETECTED (A) NOT DETECTED Final    Comment: CRITICAL RESULT CALLED TO, READ BACK BY AND VERIFIED WITH: WALID NAZARI ON 08/01/24 AT 1033 QSD    Klebsiella aerogenes NOT DETECTED NOT DETECTED Final   Klebsiella oxytoca NOT DETECTED NOT DETECTED Final   Klebsiella pneumoniae NOT DETECTED NOT DETECTED Final   Proteus species NOT DETECTED NOT DETECTED Final   Salmonella species NOT DETECTED NOT DETECTED Final   Serratia marcescens NOT DETECTED NOT DETECTED Final   Haemophilus influenzae NOT DETECTED NOT DETECTED Final   Neisseria meningitidis NOT DETECTED NOT DETECTED Final   Pseudomonas aeruginosa NOT DETECTED NOT DETECTED Final   Stenotrophomonas maltophilia NOT DETECTED NOT DETECTED Final   Candida albicans NOT DETECTED NOT DETECTED Final   Candida auris NOT DETECTED NOT DETECTED Final   Candida glabrata NOT DETECTED NOT DETECTED Final   Candida krusei NOT DETECTED NOT DETECTED Final   Candida parapsilosis NOT DETECTED NOT DETECTED Final   Candida tropicalis NOT DETECTED NOT DETECTED Final   Cryptococcus neoformans/gattii NOT DETECTED NOT DETECTED Final   CTX-M ESBL DETECTED (A) NOT DETECTED Final    Comment: CRITICAL RESULT CALLED TO, READ BACK BY AND VERIFIED WITH: WALID NAZARI ON 08/01/24 AT 1033 QSD (NOTE) Extended spectrum beta-lactamase detected. Recommend a carbapenem as initial therapy.      Carbapenem resistance IMP NOT DETECTED NOT DETECTED Final   Carbapenem resistance KPC NOT  DETECTED NOT DETECTED Final   Carbapenem resistance NDM NOT DETECTED NOT DETECTED Final   Carbapenem resist OXA 48 LIKE NOT DETECTED NOT DETECTED Final   Carbapenem resistance VIM NOT DETECTED NOT DETECTED Final    Comment: Performed at [facility], [address] [place], [place] [address]         Radiology Studies: CT ABDOMEN PELVIS WO CONTRAST Result Date: 08/01/2024 EXAM: CT ABDOMEN AND PELVIS WITHOUT CONTRAST 08/01/2024 10:03:04 AM TECHNIQUE: CT of the abdomen and pelvis was performed without the administration of intravenous contrast. Multiplanar reformatted images are provided for review. Automated exposure control, iterative reconstruction, and/or  weight-based adjustment of the mA/kV was utilized to reduce the radiation dose to as low as reasonably achievable. COMPARISON: 05/14/2024 CLINICAL HISTORY: Abdominal and flank pain, stone suspected. FINDINGS: LOWER CHEST: Mild cardiomegaly. Mild mitral valve calcification. Trace left pleural effusion with mild associated passive atelectasis. Linear subsegmental atelectasis or scarring in the lingula and right lower lobe. Aortic atheromatous vascular calcifications. LIVER: The liver is unremarkable. GALLBLADDER AND BILE DUCTS: Gallbladder is unremarkable. No biliary ductal dilatation. SPLEEN: No acute abnormality. PANCREAS: No acute abnormality. ADRENAL GLANDS: No acute abnormality. KIDNEYS, URETERS AND BLADDER: Moderate left hydronephrosis and left hydroureter despite the presence of the left nephrostomy tube. Asymmetric left perirenal stranding. Calcifications along the margin of the left distal ureter appear to be vascular and not to represent luminal calculus. Possible wall thickening of the left mid and distal ureter. 7 mm nonobstructive right renal calculus in the right renal collecting system. Empty urinary bladder. No stones in the right ureter. No right hydronephrosis. No right perinephric or periureteral stranding. GI AND BOWEL:  Stomach demonstrates no acute abnormality. There is no bowel obstruction. PERITONEUM AND RETROPERITONEUM: No ascites. No free air. Stable mild presacral/perirectal stranding. VASCULATURE: Systemic atherosclerosis is present, including the aorta and iliac arteries. Substantial atheromatous plaque proximally in the celiac trunk and superior mesenteric artery. Infrarenal IVC filter. Lateral venous structures along the anterior abdominal wall. LYMPH NODES: Clustered left retroperitoneal lymph nodes at the level of the left kidney including borderline enlarged lymph nodes such as the 1.0 cm short axis lymph node on image 41, series 2 (previously 0.7 cm). REPRODUCTIVE ORGANS: No acute abnormality. BONES AND SOFT TISSUES: Severe chronic arthropathy of both hips. Grade 1 degenerative anterolisthesis at L5-S1 with suspected fused facet joints at the L5-S1 level. Subcutaneous edema overlying the hips laterally. No acute osseous abnormality. No focal soft tissue abnormality. IMPRESSION: 1. Moderate left hydronephrosis and left hydroureter despite the presence of a left nephrostomy tube, with possible wall thickening of the left mid and distal ureter and asymmetric left perirenal stranding, without a left ureteral calculus as calcifications along the margin of the left distal ureter appear vascular. 2. 7 mm nonobstructive right renal calculus. 3. Clustered left retroperitoneal lymph nodes at the level of the left kidney, including borderline enlargement to 1.0 cm in short axis compared with 0.7 cm previously. 4. Trace left pleural effusion with mild passive atelectasis. 5. Linear subsegmental atelectasis or scarring in the lingula and right lower lobe. 6. Systemic atherosclerosis, including substantial atheromatous plaque in the proximal celiac trunk and superior mesenteric artery. 7. Mild cardiomegaly and mild mitral valve calcification. 8. Infrarenal inferior vena cava filter. 9. Stable mild presacral and perirectal  stranding, with subcutaneous edema overlying the lateral hips. 10. Severe chronic arthropathy of both hips. 11. Grade 1 degenerative anterolisthesis at l5-s1 with suspected fused facet joints at l5-s1. Electronically signed by: Ryan Salvage MD 08/01/2024 10:25 AM EST RP Workstation: [id number]   DG Chest Port 1 View Result Date: 07/31/2024 EXAM: 1 VIEW(S) XRAY OF THE CHEST 07/31/2024 03:30:16 PM COMPARISON: 05/14/2024 CLINICAL HISTORY: Shortness of breath. FINDINGS: LUNGS AND PLEURA: Low lung volumes. No focal pulmonary opacity. No pleural effusion. No pneumothorax. HEART AND MEDIASTINUM: Aortic atherosclerotic calcification. No acute abnormality of the cardiac and mediastinal silhouettes. BONES AND SOFT TISSUES: Degenerative changes in the spine and shoulders. IMPRESSION: 1. No acute findings. 2. Low lung volumes. Electronically signed by: Elsie Gravely MD 07/31/2024 03:44 PM EST RP Workstation: [id number]        Scheduled Meds:  vitamin C   1,000 mg Oral Daily   cyanocobalamin   500 mcg Oral Daily   enoxaparin   30 mg Subcutaneous Q12H   fluticasone  furoate-vilanterol  1 puff Inhalation Daily   iron  polysaccharides  150 mg Oral Daily   liver oil-zinc  oxide   Topical BID   multivitamin with minerals  1 tablet Oral Daily   pantoprazole   40 mg Oral BID   rosuvastatin   10 mg Oral Daily   Continuous Infusions:  lactated ringers  100 mL/hr at 08/01/24 0930   meropenem  (MERREM ) IV 1 g (08/01/24 1035)     LOS: 1 day     Calvin KATHEE Robson, MD [facility]   If 7PM-7AM, please contact night-coverage  08/01/2024, 10:48 AM    "

## 2024-08-02 DIAGNOSIS — N390 Urinary tract infection, site not specified: Secondary | ICD-10-CM | POA: Diagnosis not present

## 2024-08-02 LAB — GLUCOSE, CAPILLARY: Glucose-Capillary: 97 mg/dL (ref 70–99)

## 2024-08-02 NOTE — Care Management Important Message (Signed)
 Important Message  Patient Details  Name: Courtney Mack MRN: [id number] Date of Birth: [birth year]   Important Message Given:  Yes - Medicare IM     Berwyn Bigley W, CMA 08/02/2024, 1:36 PM

## 2024-08-02 NOTE — Discharge Instructions (Signed)

## 2024-08-02 NOTE — TOC Initial Note (Addendum)
 Transition of Care [facility]) - Initial/Assessment Note    Patient Details  Name: Courtney Mack MRN: [id number] Date of Birth: 05-04-1956  Transition of Care [facility] CM/SW Contact:    Corean ONEIDA Haddock, RN Phone Number: 08/02/2024, 12:37 PM  Clinical Narrative:                  Admitted for: complicated UTI Admitted from: home with daughter and 5 grandchildren PCP: [facility]  Pharmacy: denies issues obtaining home medications Current home health/prior home health/DME: RW, rollator, shower chair, manuel WC, cane  Therapy recommends HH.  Patient in agreement and states she doesn't have a preference of HH agency.  Referral sent out in HUB for review  Patient states at dc her brother will provide transport     224 pm - accepted [facility] in [place] and notified Cory with [facility]  Patient Goals and CMS Choice            Expected Discharge Plan and Services                                              Prior Living Arrangements/Services                       Activities of Daily Living   ADL Screening (condition at time of admission) Independently performs ADLs?: No Does the patient have a NEW difficulty with bathing/dressing/toileting/self-feeding that is expected to last >3 days?: No Does the patient have a NEW difficulty with getting in/out of bed, walking, or climbing stairs that is expected to last >3 days?: Yes (Initiates electronic notice to provider for possible PT consult) Does the patient have a NEW difficulty with communication that is expected to last >3 days?: No Is the patient deaf or have difficulty hearing?: No Does the patient have difficulty seeing, even when wearing glasses/contacts?: No Does the patient have difficulty concentrating, remembering, or making decisions?: No  Permission Sought/Granted                  Emotional Assessment              Admission diagnosis:  Complicated UTI (urinary tract infection) [N39.0] Patient  Active Problem List   Diagnosis Date Noted   Obesity, Class III, BMI 40-49.9 (morbid obesity) (HCC) 07/31/2024   Hyperkalemia 07/31/2024   History of infection due to ESBL Escherichia coli 05/15/2024   UTI due to extended-spectrum beta lactamase (ESBL) producing Escherichia coli 04/06/2024   COPD exacerbation (HCC) 08/27/2023   Other pulmonary embolism without acute cor pulmonale (HCC) 08/07/2023   Chronic deep vein thrombosis (DVT) of proximal vein of right lower extremity (HCC) 08/05/2023   Acute pulmonary embolism (HCC) 08/04/2023   Dysuria 06/22/2023   SOB (shortness of breath) 06/18/2023   HTN (hypertension) 06/18/2023   Complicated UTI (urinary tract infection) 05/26/2023   Diarrhea 05/26/2023   Blocked nephrostomy tube with hydronephrosis and hydroureter (HCC) 04/05/2023   Hypertension    Pressure injury of skin 02/11/2023   Severe sepsis with acute organ dysfunction (HCC) 02/10/2023   Obesity (BMI 30-39.9) 11/22/2022   Sepsis due to gram-negative UTI (HCC) 11/21/2022   Acute pyelonephritis 11/21/2022   Dyspnea 11/21/2022   Chest pain 11/21/2022   Hypokalemia 10/25/2022   Chronic obstructive pulmonary disease (COPD) (HCC) 10/24/2022   Iron  deficiency anemia 10/24/2022   Symptomatic anemia 08/08/2022  COPD (chronic obstructive pulmonary disease) (HCC) 08/07/2022   Sepsis due to pneumonia (HCC) 06/12/2022   Multifocal pneumonia 06/12/2022   GERD without esophagitis 06/12/2022   Generalized weakness 06/12/2022   Nephrostomy status (HCC) 06/12/2022   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) 04/16/2022   GI bleed 03/14/2022   Acute on chronic anemia 02/24/2022   Hypotension 02/24/2022   Acute cystitis with hematuria    Lymphedema 02/08/2022   Hematuria 12/24/2021   History of pulmonary embolism/DVT s/p IVC filter 12/24/2021   Chronic radiation cystitis 11/17/2021   Hydronephrosis of left kidney    Gastric antral vascular ectasia    Complicated  urinary tract infection 09/13/2021   AKI (acute kidney injury) 09/13/2021   Melena 07/18/2021   Acute on chronic blood loss anemia 07/18/2021   HLD (hyperlipidemia) 06/20/2021   Chronic diastolic CHF (congestive heart failure) (HCC) 06/20/2021   Risk factors for obstructive sleep apnea 11/10/2020   IDA (iron  deficiency anemia) 09/21/2020   Endometrial cancer (HCC) 09/13/2020   Recurrent deep vein thrombosis (DVT) (HCC) 09/06/2020   Class 3 obesity (HCC) 08/23/2020   PCP:  [facility]:   [id number] [place], [place] - [address] AT [facility] [address] [place] [address] [place] [address] Phone: [phone] Fax: [phone]     Social Drivers of Health (SDOH) Social History: SDOH Screenings   Food Insecurity: No Food Insecurity (07/31/2024)  Recent Concern: Food Insecurity - Food Insecurity Present (05/14/2024)  Housing: Low Risk (07/31/2024)  Transportation Needs: No Transportation Needs (07/31/2024)  Utilities: Not At Risk (07/31/2024)  Depression (PHQ2-9): Low Risk (04/11/2022)  Social Connections: Socially Isolated (07/31/2024)  Tobacco Use: Low Risk (07/31/2024)   SDOH Interventions:     Readmission Risk Interventions    08/02/2024   12:36 PM 04/07/2024    3:24 PM 08/07/2023   10:26 AM  Readmission Risk Prevention Plan  Transportation Screening Complete Complete   PCP or Specialist Appt within 3-5 Days Complete Complete   HRI or Home Care Consult Complete    Social Work Consult for Recovery Care Planning/Counseling  Complete   Palliative Care Screening Not Applicable Not Applicable   Medication Review Oceanographer) Complete Complete Complete  PCP or Specialist appointment within 3-5 days of discharge   Complete  SW Recovery Care/Counseling Consult   Complete  Palliative Care Screening   Not Applicable  Skilled Nursing Facility   Not Applicable

## 2024-08-02 NOTE — Progress Notes (Signed)
 Mobility Specialist Progress Note:    08/02/24 1041  Mobility  Activity Stood with assistance;Pivoted/transferred from chair to bed  Level of Assistance Minimal assist, patient does 75% or more  Assistive Device Front wheel walker  Range of Motion/Exercises Active;All extremities  Activity Response Tolerated well  Mobility visit 1 Mobility  Mobility Specialist Start Time (ACUTE ONLY) 1024  Mobility Specialist Stop Time (ACUTE ONLY) 1041  Mobility Specialist Time Calculation (min) (ACUTE ONLY) 17 min   Pt received in chair requesting assistance to bed. Required MinA to stand and transfer with RW. Tolerated well, audible SOB however VS WNL. Left pt fowlers with alarm on and all belongings in reach. All needs met.   Sherrilee Ditty Mobility Specialist Please contact via Special Educational Needs Teacher or  Rehab office at [phone]

## 2024-08-02 NOTE — Progress Notes (Signed)
 " PROGRESS NOTE    Courtney Mack  FMW:[id number] DOB: 21-Mar-1956 DOA: 07/31/2024 PCP: [facility]    Brief Narrative:  69 y.o. female with medical history significant of left-sided hydronephrosis status post nephrostomy tube placement, recurrent UTI and sepsis due to UTI, ESBL, hypertension, COPD, history of extensive bilateral PE and DVT (s/p of IVC filter, on lovenox ), dCHF, who presents with weakness, dysuria and shortness of breath.   Patient states that she she feels fatigued and tired today.  She has dysuria and burning on urination.  No urinary frequency and gross hematuria.  Patient has chills, no fever.  She also reports SOB on exertion earlier, which has resolved currently.  Patient has mild dry cough, no chest pain.  No sore throat or runny nose.  Patient does not have nausea vomiting, diarrhea or abdominal pain.  No respiratory distress.  Assessment & Plan:   Principal Problem:   Complicated UTI (urinary tract infection) Active Problems:   AKI (acute kidney injury)   Hyperkalemia   Chronic diastolic CHF (congestive heart failure) (HCC)   COPD (chronic obstructive pulmonary disease) (HCC)   Hypertension   HLD (hyperlipidemia)   Recurrent deep vein thrombosis (DVT) (HCC)   History of pulmonary embolism/DVT s/p IVC filter   Iron  deficiency anemia   Obesity, Class III, BMI 40-49.9 (morbid obesity) (HCC)  Complicated UTI (urinary tract infection):  Patient has elevated lactic acid of 3.1 --> 2.4.  Patient is at high risk for developing sepsis.  Patient has WBC 10.8, fever noted 1/25.  CT abdomen pelvis without contrast on 1/25 with appropriately placed nephrostomy, mild hydronephrosis and hydroureter.  No evidence of obstructive stone Plan: Continue IV meropenem  Follow blood and urine cultures  IVF Monitor vitals and fever curve  AKI (acute kidney injury):  Baseline creatinine 0.65 on 05/20/2024.  Her creatinine is 1.39, BUN 20, GFR 41 today.   Likely due to UTI. Plan: Lasix  held IVF  Hyperkalemia, resolved  Potassium 5.2. Responded to IV fluids and Lokelma    Chronic diastolic CHF (congestive heart failure) (HCC):  2D echo on 11/21/2022 showed EF of 60-65% with grade 1 diastolic dysfunction.  Patient does not have leg edema.  CHF seems to be compensated. Plan: Lasix  on hold Monitor volume status following fluids   COPD (chronic obstructive pulmonary disease) [facility]):  Patient reports shortness of breath earlier, which has improved in ED.  No oxygen desaturation.  PCR negative for COVID, flu and RSV.  Chest x-ray negative.  BNP 510, no leg edema, does not seem to have COPD exacerbation. Plan: Bronchodilators as needed Mucinex    Hypertension:  Blood pressure is soft Plan: Hold Lasix    HLD: Continue Crestor    Recurrent deep vein thrombosis (DVT) (HCC) and History of pulmonary embolism/DVT s/p IVC filter: Home weight-based Lovenox    Iron  deficiency anemia:  Hemoglobin stable 9.3 (8.5 on 05/20/2024) P.o. iron    Obesity, Class III, BMI 40-49.9 (morbid obesity) (HCC):  Patient has Obesity Class III, with body weight  97.1 Kg and BMI 43.24 kg/m2.  Complicating factor in overall care and prognosis   DVT prophylaxis: Lovenox  Code Status: Full Family Communication: None Disposition Plan: Status is: Inpatient Remains inpatient appropriate because: Sepsis secondary to UTI   Level of care: Telemetry  Consultants:  None  Procedures:  None  Antimicrobials: Meropenem    Subjective: Examined peer resting in bed.  No visible distress.  Feels better.  Objective: Vitals:   08/01/24 2018 08/02/24 0335 08/02/24 0500 08/02/24 0735  BP: ROLLEN)  115/59 (!) 108/59  (!) 102/42  Pulse: 90 90  81  Resp: 18 18  16   Temp: 99.5 F (37.5 C) 98.9 F (37.2 C)  98.5 F (36.9 C)  TempSrc: Oral Oral    SpO2: 97% 98%  94%  Weight:   97 kg   Height:        Intake/Output Summary (Last 24 hours) at 08/02/2024 1023 Last data filed  at 08/02/2024 0659 Gross per 24 hour  Intake 1701.6 ml  Output 1150 ml  Net 551.6 ml   Filed Weights   07/31/24 2019 08/01/24 0500 08/02/24 0500  Weight: 97.1 kg 97 kg 97 kg    Examination:  General exam: Chronically ill-appearing Respiratory system: Clear to auscultation. Respiratory effort normal. Cardiovascular system: S1 S2, RRR, no murmurs, no pedal edema Gastrointestinal system: Soft, NT/ND, normal bowel sounds, nephrostomy in place Central nervous system: Alert and oriented. No focal neurological deficits. Extremities: Symmetric 5 x 5 power. Skin: No rashes, lesions or ulcers Psychiatry: Judgement and insight appear normal. Mood & affect appropriate.     Data Reviewed: I have personally reviewed following labs and imaging studies  CBC: Recent Labs  Lab 07/31/24 1512 08/01/24 0508  WBC 10.8* 11.0*  HGB 9.3* 8.7*  HCT 31.3* 29.0*  MCV 91.8 93.5  PLT 265 247   Basic Metabolic Panel: Recent Labs  Lab 07/31/24 1512 08/01/24 0508  NA 137 137  K 5.2* 4.5  CL 104 105  CO2 21* 24  GLUCOSE 156* 128*  BUN 20 19  CREATININE 1.39* 1.34*  CALCIUM  9.2 8.9   GFR: Estimated Creatinine Clearance: 40.5 mL/min (A) (by C-G formula based on SCr of 1.34 mg/dL (H)). Liver Function Tests: Recent Labs  Lab 07/31/24 1512  AST 43*  ALT 13  ALKPHOS 63  BILITOT 1.2  PROT 6.7  ALBUMIN 3.3*   No results for input(s): LIPASE, AMYLASE in the last 168 hours. No results for input(s): AMMONIA in the last 168 hours. Coagulation Profile: Recent Labs  Lab 07/31/24 1511  INR 1.1   Cardiac Enzymes: No results for input(s): CKTOTAL, CKMB, CKMBINDEX, TROPONINI in the last 168 hours. BNP (last 3 results) Recent Labs    07/31/24 1511  PROBNP 510.0*   HbA1C: No results for input(s): HGBA1C in the last 72 hours. CBG: No results for input(s): GLUCAP in the last 168 hours. Lipid Profile: No results for input(s): CHOL, HDL, LDLCALC, TRIG, CHOLHDL,  LDLDIRECT in the last 72 hours. Thyroid Function Tests: No results for input(s): TSH, T4TOTAL, FREET4, T3FREE, THYROIDAB in the last 72 hours. Anemia Panel: No results for input(s): VITAMINB12, FOLATE, FERRITIN, TIBC, IRON , RETICCTPCT in the last 72 hours. Sepsis Labs: Recent Labs  Lab 07/31/24 1735 07/31/24 2020 07/31/24 2235 08/01/24 0110  LATICACIDVEN 2.4* 1.8 1.1 1.2    Recent Results (from the past 240 hours)  Culture, blood (routine x 2)     Status: None (Preliminary result)   Collection Time: 07/31/24  3:12 PM   Specimen: BLOOD  Result Value Ref Range Status   Specimen Description   Final    BLOOD Performed at [facility], [address] [place], [place] [address]    Special Requests   Final    BOTTLES DRAWN AEROBIC AND ANAEROBIC Blood Culture results may not be optimal due to an inadequate volume of blood received in culture bottles Performed at [facility], [address] [place], [place] [address]    Culture  Setup Time   Final  GRAM NEGATIVE RODS AEROBIC BOTTLE ONLY CRITICAL RESULT CALLED TO, READ BACK BY AND VERIFIED WITH: WALID NAZARI ON 08/01/24 AT 1033 QSD Performed at [facility], [address], [place], [place] [address]    Culture GRAM NEGATIVE RODS  Final   Report Status PENDING  Incomplete  Culture, blood (routine x 2)     Status: None (Preliminary result)   Collection Time: 07/31/24  3:12 PM   Specimen: BLOOD  Result Value Ref Range Status   Specimen Description BLOOD  Final   Special Requests   Final    BOTTLES DRAWN AEROBIC AND ANAEROBIC Blood Culture results may not be optimal due to an inadequate volume of blood received in culture bottles   Culture   Final    NO GROWTH 2 DAYS Performed at [facility], [address] [place], [place] [address]    Report Status PENDING  Incomplete  Resp panel by RT-PCR (RSV, Flu A&B, Covid) Anterior Nasal Swab     Status: None   Collection  Time: 07/31/24  3:12 PM   Specimen: Anterior Nasal Swab  Result Value Ref Range Status   SARS Coronavirus 2 by RT PCR NEGATIVE NEGATIVE Final    Comment: (NOTE) SARS-CoV-2 target nucleic acids are NOT DETECTED.  The SARS-CoV-2 RNA is generally detectable in upper respiratory specimens during the acute phase of infection. The lowest concentration of SARS-CoV-2 viral copies this assay can detect is 138 copies/mL. A negative result does not preclude SARS-Cov-2 infection and should not be used as the sole basis for treatment or other patient management decisions. A negative result may occur with  improper specimen collection/handling, submission of specimen other than nasopharyngeal swab, presence of viral mutation(s) within the areas targeted by this assay, and inadequate number of viral copies(<138 copies/mL). A negative result must be combined with clinical observations, patient history, and epidemiological information. The expected result is Negative.  Fact Sheet for Patients:  bloggercourse.com  Fact Sheet for Healthcare Providers:  seriousbroker.it  This test is no t yet approved or cleared by the [place]  FDA and  has been authorized for detection and/or diagnosis of SARS-CoV-2 by FDA under an Emergency Use Authorization (EUA). This EUA will remain  in effect (meaning this test can be used) for the duration of the COVID-19 declaration under Section 564(b)(1) of the Act, 21 U.S.C.section 360bbb-3(b)(1), unless the authorization is terminated  or revoked sooner.       Influenza A by PCR NEGATIVE NEGATIVE Final   Influenza B by PCR NEGATIVE NEGATIVE Final    Comment: (NOTE) The Xpert Xpress SARS-CoV-2/FLU/RSV plus assay is intended as an aid in the diagnosis of influenza from Nasopharyngeal swab specimens and should not be used as a sole basis for treatment. Nasal washings and aspirates are unacceptable for Xpert Xpress  SARS-CoV-2/FLU/RSV testing.  Fact Sheet for Patients: bloggercourse.com  Fact Sheet for Healthcare Providers: seriousbroker.it  This test is not yet approved or cleared by the [place]  FDA and has been authorized for detection and/or diagnosis of SARS-CoV-2 by FDA under an Emergency Use Authorization (EUA). This EUA will remain in effect (meaning this test can be used) for the duration of the COVID-19 declaration under Section 564(b)(1) of the Act, 21 U.S.C. section 360bbb-3(b)(1), unless the authorization is terminated or revoked.     Resp Syncytial Virus by PCR NEGATIVE NEGATIVE Final    Comment: (NOTE) Fact Sheet for Patients: bloggercourse.com  Fact Sheet for Healthcare Providers: seriousbroker.it  This test is not  yet approved or cleared by the [place]  FDA and has been authorized for detection and/or diagnosis of SARS-CoV-2 by FDA under an Emergency Use Authorization (EUA). This EUA will remain in effect (meaning this test can be used) for the duration of the COVID-19 declaration under Section 564(b)(1) of the Act, 21 U.S.C. section 360bbb-3(b)(1), unless the authorization is terminated or revoked.  Performed at [facility], [address] [place], [place] [address]   Blood Culture ID Panel (Reflexed)     Status: Abnormal   Collection Time: 07/31/24  3:12 PM  Result Value Ref Range Status   Enterococcus faecalis NOT DETECTED NOT DETECTED Final   Enterococcus Faecium NOT DETECTED NOT DETECTED Final   Listeria monocytogenes NOT DETECTED NOT DETECTED Final   Staphylococcus species NOT DETECTED NOT DETECTED Final   Staphylococcus aureus (BCID) NOT DETECTED NOT DETECTED Final   Staphylococcus epidermidis NOT DETECTED NOT DETECTED Final   Staphylococcus lugdunensis NOT DETECTED NOT DETECTED Final   Streptococcus species NOT DETECTED NOT DETECTED Final    Streptococcus agalactiae NOT DETECTED NOT DETECTED Final   Streptococcus pneumoniae NOT DETECTED NOT DETECTED Final   Streptococcus pyogenes NOT DETECTED NOT DETECTED Final   A.calcoaceticus-baumannii NOT DETECTED NOT DETECTED Final   Bacteroides fragilis NOT DETECTED NOT DETECTED Final   Enterobacterales DETECTED (A) NOT DETECTED Final    Comment: Enterobacterales represent a large order of gram negative bacteria, not a single organism. CRITICAL RESULT CALLED TO, READ BACK BY AND VERIFIED WITH: WALID NAZARI ON 08/01/24 AT 1033 QSD    Enterobacter cloacae complex NOT DETECTED NOT DETECTED Final   Escherichia coli DETECTED (A) NOT DETECTED Final    Comment: CRITICAL RESULT CALLED TO, READ BACK BY AND VERIFIED WITH: WALID NAZARI ON 08/01/24 AT 1033 QSD    Klebsiella aerogenes NOT DETECTED NOT DETECTED Final   Klebsiella oxytoca NOT DETECTED NOT DETECTED Final   Klebsiella pneumoniae NOT DETECTED NOT DETECTED Final   Proteus species NOT DETECTED NOT DETECTED Final   Salmonella species NOT DETECTED NOT DETECTED Final   Serratia marcescens NOT DETECTED NOT DETECTED Final   Haemophilus influenzae NOT DETECTED NOT DETECTED Final   Neisseria meningitidis NOT DETECTED NOT DETECTED Final   Pseudomonas aeruginosa NOT DETECTED NOT DETECTED Final   Stenotrophomonas maltophilia NOT DETECTED NOT DETECTED Final   Candida albicans NOT DETECTED NOT DETECTED Final   Candida auris NOT DETECTED NOT DETECTED Final   Candida glabrata NOT DETECTED NOT DETECTED Final   Candida krusei NOT DETECTED NOT DETECTED Final   Candida parapsilosis NOT DETECTED NOT DETECTED Final   Candida tropicalis NOT DETECTED NOT DETECTED Final   Cryptococcus neoformans/gattii NOT DETECTED NOT DETECTED Final   CTX-M ESBL DETECTED (A) NOT DETECTED Final    Comment: CRITICAL RESULT CALLED TO, READ BACK BY AND VERIFIED WITH: WALID NAZARI ON 08/01/24 AT 1033 QSD (NOTE) Extended spectrum beta-lactamase detected. Recommend a  carbapenem as initial therapy.      Carbapenem resistance IMP NOT DETECTED NOT DETECTED Final   Carbapenem resistance KPC NOT DETECTED NOT DETECTED Final   Carbapenem resistance NDM NOT DETECTED NOT DETECTED Final   Carbapenem resist OXA 48 LIKE NOT DETECTED NOT DETECTED Final   Carbapenem resistance VIM NOT DETECTED NOT DETECTED Final    Comment: Performed at [facility], [address], [place], [place] [address]  Urine Culture (for pregnant, neutropenic or urologic patients or patients with an indwelling urinary catheter)     Status: Abnormal (Preliminary result)   Collection Time: 07/31/24  5:35 PM   Specimen: Urine, Random  Result Value Ref Range Status   Specimen Description   Final    URINE, RANDOM Performed at [facility], [address] [place], [place] [address]    Special Requests   Final    NONE Performed at [facility], [address] [place], [place] [address]    Culture (A)  Final    >=100,000 COLONIES/mL VONNE NEGATIVE RODS SUSCEPTIBILITIES TO FOLLOW Performed at [facility], [address], [place], [place] [address]    Report Status PENDING  Incomplete         Radiology Studies: CT ABDOMEN PELVIS WO CONTRAST Result Date: 08/01/2024 EXAM: CT ABDOMEN AND PELVIS WITHOUT CONTRAST 08/01/2024 10:03:04 AM TECHNIQUE: CT of the abdomen and pelvis was performed without the administration of intravenous contrast. Multiplanar reformatted images are provided for review. Automated exposure control, iterative reconstruction, and/or weight-based adjustment of the mA/kV was utilized to reduce the radiation dose to as low as reasonably achievable. COMPARISON: 05/14/2024 CLINICAL HISTORY: Abdominal and flank pain, stone suspected. FINDINGS: LOWER CHEST: Mild cardiomegaly. Mild mitral valve calcification. Trace left pleural effusion with mild associated passive atelectasis. Linear subsegmental atelectasis or scarring in the lingula and right  lower lobe. Aortic atheromatous vascular calcifications. LIVER: The liver is unremarkable. GALLBLADDER AND BILE DUCTS: Gallbladder is unremarkable. No biliary ductal dilatation. SPLEEN: No acute abnormality. PANCREAS: No acute abnormality. ADRENAL GLANDS: No acute abnormality. KIDNEYS, URETERS AND BLADDER: Moderate left hydronephrosis and left hydroureter despite the presence of the left nephrostomy tube. Asymmetric left perirenal stranding. Calcifications along the margin of the left distal ureter appear to be vascular and not to represent luminal calculus. Possible wall thickening of the left mid and distal ureter. 7 mm nonobstructive right renal calculus in the right renal collecting system. Empty urinary bladder. No stones in the right ureter. No right hydronephrosis. No right perinephric or periureteral stranding. GI AND BOWEL: Stomach demonstrates no acute abnormality. There is no bowel obstruction. PERITONEUM AND RETROPERITONEUM: No ascites. No free air. Stable mild presacral/perirectal stranding. VASCULATURE: Systemic atherosclerosis is present, including the aorta and iliac arteries. Substantial atheromatous plaque proximally in the celiac trunk and superior mesenteric artery. Infrarenal IVC filter. Lateral venous structures along the anterior abdominal wall. LYMPH NODES: Clustered left retroperitoneal lymph nodes at the level of the left kidney including borderline enlarged lymph nodes such as the 1.0 cm short axis lymph node on image 41, series 2 (previously 0.7 cm). REPRODUCTIVE ORGANS: No acute abnormality. BONES AND SOFT TISSUES: Severe chronic arthropathy of both hips. Grade 1 degenerative anterolisthesis at L5-S1 with suspected fused facet joints at the L5-S1 level. Subcutaneous edema overlying the hips laterally. No acute osseous abnormality. No focal soft tissue abnormality. IMPRESSION: 1. Moderate left hydronephrosis and left hydroureter despite the presence of a left nephrostomy tube, with  possible wall thickening of the left mid and distal ureter and asymmetric left perirenal stranding, without a left ureteral calculus as calcifications along the margin of the left distal ureter appear vascular. 2. 7 mm nonobstructive right renal calculus. 3. Clustered left retroperitoneal lymph nodes at the level of the left kidney, including borderline enlargement to 1.0 cm in short axis compared with 0.7 cm previously. 4. Trace left pleural effusion with mild passive atelectasis. 5. Linear subsegmental atelectasis or scarring in the lingula and right lower lobe. 6. Systemic atherosclerosis, including substantial atheromatous plaque in the proximal celiac trunk and superior mesenteric artery. 7. Mild cardiomegaly and mild mitral valve calcification. 8.  Infrarenal inferior vena cava filter. 9. Stable mild presacral and perirectal stranding, with subcutaneous edema overlying the lateral hips. 10. Severe chronic arthropathy of both hips. 11. Grade 1 degenerative anterolisthesis at l5-s1 with suspected fused facet joints at l5-s1. Electronically signed by: Ryan Salvage MD 08/01/2024 10:25 AM EST RP Workstation: [id number]   DG Chest Port 1 View Result Date: 07/31/2024 EXAM: 1 VIEW(S) XRAY OF THE CHEST 07/31/2024 03:30:16 PM COMPARISON: 05/14/2024 CLINICAL HISTORY: Shortness of breath. FINDINGS: LUNGS AND PLEURA: Low lung volumes. No focal pulmonary opacity. No pleural effusion. No pneumothorax. HEART AND MEDIASTINUM: Aortic atherosclerotic calcification. No acute abnormality of the cardiac and mediastinal silhouettes. BONES AND SOFT TISSUES: Degenerative changes in the spine and shoulders. IMPRESSION: 1. No acute findings. 2. Low lung volumes. Electronically signed by: Elsie Gravely MD 07/31/2024 03:44 PM EST RP Workstation: [id number]        Scheduled Meds:  vitamin C   1,000 mg Oral Daily   cyanocobalamin   500 mcg Oral Daily   enoxaparin   30 mg Subcutaneous Q12H   fluticasone  furoate-vilanterol  1  puff Inhalation Daily   iron  polysaccharides  150 mg Oral Daily   liver oil-zinc  oxide   Topical BID   multivitamin with minerals  1 tablet Oral Daily   pantoprazole   40 mg Oral BID   rosuvastatin   10 mg Oral Daily   Continuous Infusions:  lactated ringers  100 mL/hr at 08/02/24 9366   meropenem  (MERREM ) IV 1 g (08/02/24 0938)     LOS: 2 days     Calvin KATHEE Robson, MD [facility]   If 7PM-7AM, please contact night-coverage  08/02/2024, 10:23 AM    "

## 2024-08-02 NOTE — Evaluation (Signed)
 Physical Therapy Evaluation Patient Details Name: Courtney Mack MRN: [id number] DOB: 22-Sep-1955 Today's Date: 08/02/2024  History of Present Illness  69 y.o. female with medical history significant of left-sided hydronephrosis status post nephrostomy tube placement, recurrent UTI and sepsis due to UTI, ESBL, hypertension, COPD, history of extensive bilateral PE and DVT (s/p of IVC filter, on lovenox ), dCHF, who presents with weakness, dysuria and shortness of breath.  Clinical Impression  Patient noted to be in supine position at PT arrival in room, for an initial PT evaluation due to a decline in functional status, with baseline mobility reported as needI with ADLs with limited mobility to household distances, and currently requiring min/modA for bed mobility and transfers. The patient is A&O x 4, presenting with good willingness to work with PT. The patient resides in a apartment and lives with daughter with family/friend support. There are no STE inside the residence.  Vitals are unstable with an SpO? of >90% on RA; elevated HR response to mobility (110s sitting EOB and 130s after transfer to chair - recovered after 3 min. The overall clinical impression is that the patient presents with moderate mobility limitations. Recommended skilled PT will address safety, mobility, and discharge planning.        If plan is discharge home, recommend the following: A little help with walking and/or transfers;A little help with bathing/dressing/bathroom;Help with stairs or ramp for entrance;Assistance with cooking/housework;Assist for transportation   Can travel by private vehicle        Equipment Recommendations Rolling walker (2 wheels)  Recommendations for Other Services       Functional Status Assessment Patient has had a recent decline in their functional status and demonstrates the ability to make significant improvements in function in a reasonable and predictable amount of time.      Precautions / Restrictions Restrictions Weight Bearing Restrictions Per Provider Order: No      Mobility  Bed Mobility Overal bed mobility: Needs Assistance Bed Mobility: Supine to Sit     Supine to sit: Mod assist, Used rails, HOB elevated     General bed mobility comments: physical assistance to LE provided; increased RR and HR sitting EOB; SOB recovered after 3 min. rest (HR 110s)    Transfers Overall transfer level: Needs assistance Equipment used: Rolling walker (2 wheels) Transfers: Bed to chair/wheelchair/BSC, Sit to/from Stand Sit to Stand: Min assist   Step pivot transfers: Min assist       General transfer comment: able to stand to don depends in standing with assistance; elevated HR after transfer to chair elevated HR 130s with increased RR; pt. reclined in chair to recovered; pt recovered after 2 min. HR to 90s; RN notified    Ambulation/Gait               General Gait Details: held due to elevated HR response with transfer  Stairs            Wheelchair Mobility     Tilt Bed    Modified Rankin (Stroke Patients Only)       Balance Overall balance assessment: Needs assistance Sitting-balance support: Feet supported Sitting balance-Leahy Scale: Good     Standing balance support: During functional activity, Reliant on assistive device for balance Standing balance-Leahy Scale: Fair                               Pertinent Vitals/Pain Pain Assessment Pain Assessment: No/denies pain  Home Living Family/patient expects to be discharged to:: Private residence Living Arrangements: Children Available Help at Discharge: Family;Available 24 hours/day Type of Home: Apartment Home Access: Stairs to enter Entrance Stairs-Rails: None Entrance Stairs-Number of Steps: 1   Home Layout: One level Home Equipment: Rollator (4 wheels);BSC/3in1;Shower seat;Wheelchair - manual;Cane - quad;Cane - single point;Adaptive  equipment Additional Comments: sponge bathes at baseline, sleeps in a recliner at home    Prior Function Prior Level of Function : Needs assist       Physical Assist : ADLs (physical);Mobility (physical) Mobility (physical): Gait ADLs (physical): IADLs;Dressing;Bathing;Grooming Mobility Comments: amb in house with rollator, has manual w/c for community distances, sleeps in recliner at home ADLs Comments: Mod I for ADLs, sponge bathing at baseline w/ daughter assisting to wash hair. assist with compression stockings. Daughter manages most IADLs such as meals but pt reports managing own meds.     Extremity/Trunk Assessment   Upper Extremity Assessment Upper Extremity Assessment: Generalized weakness    Lower Extremity Assessment Lower Extremity Assessment: Generalized weakness    Cervical / Trunk Assessment Cervical / Trunk Assessment: Normal  Communication   Communication Communication: No apparent difficulties    Cognition Arousal: Alert Behavior During Therapy: WFL for tasks assessed/performed   PT - Cognitive impairments: No apparent impairments                         Following commands: Intact       Cueing Cueing Techniques: Verbal cues     General Comments      Exercises     Assessment/Plan    PT Assessment Patient needs continued PT services  PT Problem List Decreased strength;Decreased activity tolerance;Decreased mobility       PT Treatment Interventions Gait training;Stair training;Functional mobility training;Therapeutic activities;Therapeutic exercise;Neuromuscular re-education;Patient/family education;Balance training    PT Goals (Current goals can be found in the Care Plan section)  Acute Rehab PT Goals Patient Stated Goal: pt wants to go home PT Goal Formulation: With patient Time For Goal Achievement: 08/23/24 Potential to Achieve Goals: Good    Frequency Min 2X/week     Co-evaluation               AM-PAC PT 6  Clicks Mobility  Outcome Measure Help needed turning from your back to your side while in a flat bed without using bedrails?: A Little Help needed moving from lying on your back to sitting on the side of a flat bed without using bedrails?: A Little Help needed moving to and from a bed to a chair (including a wheelchair)?: A Little Help needed standing up from a chair using your arms (e.g., wheelchair or bedside chair)?: A Little Help needed to walk in hospital room?: A Little Help needed climbing 3-5 steps with a railing? : A Lot 6 Click Score: 17    End of Session   Activity Tolerance: Patient limited by fatigue Patient left: in chair;with call bell/phone within reach;with chair alarm set Nurse Communication: Mobility status PT Visit Diagnosis: Other abnormalities of gait and mobility (R26.89);Other symptoms and signs involving the nervous system (R29.898);Difficulty in walking, not elsewhere classified (R26.2)    Time: 9169-9148 PT Time Calculation (min) (ACUTE ONLY): 21 min   Charges:   PT Evaluation $PT Eval Low Complexity: 1 Low   PT General Charges $$ ACUTE PT VISIT: 1 Visit         Courtney Lesches DPT, PT    Courtney Mack 08/02/2024, 9:13 AM

## 2024-08-03 ENCOUNTER — Inpatient Hospital Stay: Admitting: Radiology

## 2024-08-03 ENCOUNTER — Inpatient Hospital Stay

## 2024-08-03 DIAGNOSIS — T83011A Breakdown (mechanical) of indwelling urethral catheter, initial encounter: Secondary | ICD-10-CM | POA: Diagnosis not present

## 2024-08-03 DIAGNOSIS — B9620 Unspecified Escherichia coli [E. coli] as the cause of diseases classified elsewhere: Secondary | ICD-10-CM | POA: Diagnosis not present

## 2024-08-03 DIAGNOSIS — N1330 Unspecified hydronephrosis: Secondary | ICD-10-CM | POA: Diagnosis not present

## 2024-08-03 DIAGNOSIS — N179 Acute kidney failure, unspecified: Secondary | ICD-10-CM | POA: Diagnosis not present

## 2024-08-03 DIAGNOSIS — N138 Other obstructive and reflux uropathy: Secondary | ICD-10-CM

## 2024-08-03 DIAGNOSIS — Z1612 Extended spectrum beta lactamase (ESBL) resistance: Secondary | ICD-10-CM | POA: Diagnosis not present

## 2024-08-03 DIAGNOSIS — N390 Urinary tract infection, site not specified: Secondary | ICD-10-CM | POA: Diagnosis not present

## 2024-08-03 DIAGNOSIS — R7881 Bacteremia: Secondary | ICD-10-CM | POA: Diagnosis not present

## 2024-08-03 DIAGNOSIS — A498 Other bacterial infections of unspecified site: Secondary | ICD-10-CM

## 2024-08-03 LAB — BASIC METABOLIC PANEL WITH GFR
Anion gap: 7 (ref 5–15)
BUN: 23 mg/dL (ref 8–23)
CO2: 23 mmol/L (ref 22–32)
Calcium: 8.6 mg/dL — ABNORMAL LOW (ref 8.9–10.3)
Chloride: 106 mmol/L (ref 98–111)
Creatinine, Ser: 1.02 mg/dL — ABNORMAL HIGH (ref 0.44–1.00)
GFR, Estimated: 59 mL/min — ABNORMAL LOW
Glucose, Bld: 119 mg/dL — ABNORMAL HIGH (ref 70–99)
Potassium: 4.1 mmol/L (ref 3.5–5.1)
Sodium: 136 mmol/L (ref 135–145)

## 2024-08-03 LAB — CBC WITH DIFFERENTIAL/PLATELET
Abs Immature Granulocytes: 0.02 10*3/uL (ref 0.00–0.07)
Basophils Absolute: 0 10*3/uL (ref 0.0–0.1)
Basophils Relative: 1 %
Eosinophils Absolute: 0.1 10*3/uL (ref 0.0–0.5)
Eosinophils Relative: 3 %
HCT: 25.1 % — ABNORMAL LOW (ref 36.0–46.0)
Hemoglobin: 7.4 g/dL — ABNORMAL LOW (ref 12.0–15.0)
Immature Granulocytes: 1 %
Lymphocytes Relative: 19 %
Lymphs Abs: 0.7 10*3/uL (ref 0.7–4.0)
MCH: 27.3 pg (ref 26.0–34.0)
MCHC: 29.5 g/dL — ABNORMAL LOW (ref 30.0–36.0)
MCV: 92.6 fL (ref 80.0–100.0)
Monocytes Absolute: 0.3 10*3/uL (ref 0.1–1.0)
Monocytes Relative: 7 %
Neutro Abs: 2.5 10*3/uL (ref 1.7–7.7)
Neutrophils Relative %: 69 %
Platelets: 221 10*3/uL (ref 150–400)
RBC: 2.71 MIL/uL — ABNORMAL LOW (ref 3.87–5.11)
RDW: 17.7 % — ABNORMAL HIGH (ref 11.5–15.5)
WBC: 3.6 10*3/uL — ABNORMAL LOW (ref 4.0–10.5)
nRBC: 0 % (ref 0.0–0.2)

## 2024-08-03 LAB — URINE CULTURE: Culture: >=100000 — AB

## 2024-08-03 LAB — CULTURE, BLOOD (ROUTINE X 2)

## 2024-08-03 LAB — TROPONIN T, HIGH SENSITIVITY: Troponin T High Sensitivity: 14 ng/L (ref 0–19)

## 2024-08-03 MED ORDER — IOHEXOL 300 MG/ML  SOLN
10.0000 mL | Freq: Once | INTRAMUSCULAR | Status: AC | PRN
  Administered 2024-08-03: 10 mL

## 2024-08-03 MED ORDER — MORPHINE SULFATE (PF) 2 MG/ML IV SOLN
2.0000 mg | INTRAVENOUS | Status: DC | PRN
  Administered 2024-08-03 – 2024-08-04 (×3): 2 mg via INTRAVENOUS
  Filled 2024-08-03 (×3): qty 1

## 2024-08-03 MED ORDER — IPRATROPIUM-ALBUTEROL 0.5-2.5 (3) MG/3ML IN SOLN: 3.0000 mL | RESPIRATORY_TRACT | Status: DC | PRN

## 2024-08-03 MED ORDER — SODIUM CHLORIDE 0.9 % IV SOLN
1.0000 g | Freq: Three times a day (TID) | INTRAVENOUS | Status: AC
  Administered 2024-08-03 – 2024-08-05 (×7): 1 g via INTRAVENOUS
  Filled 2024-08-03 (×7): qty 20

## 2024-08-03 MED ORDER — TRIMETHOPRIM 100 MG PO TABS: 100.0000 mg | ORAL_TABLET | Freq: Every day | ORAL | Status: DC

## 2024-08-03 MED ORDER — LIDOCAINE HCL 1 % IJ SOLN
INTRAMUSCULAR | Status: AC
  Filled 2024-08-03: qty 20

## 2024-08-03 MED ORDER — FUROSEMIDE 20 MG PO TABS
20.0000 mg | ORAL_TABLET | Freq: Every day | ORAL | Status: DC
  Administered 2024-08-03 – 2024-08-06 (×4): 20 mg via ORAL
  Filled 2024-08-03 (×4): qty 1

## 2024-08-03 NOTE — Progress Notes (Signed)
 Patient c/o chest pain , given PRN morphine  after new IV obtained, CXR ordered and triponin,  EKG in process of being performed.

## 2024-08-03 NOTE — Consult Note (Signed)
 NAME: Courtney Mack  DOB: 09-30-1955  MRN: [id number]  Date/Time: 08/03/2024 3:34 PM  REQUESTING PROVIDER: Dr.Sreenath Subjective:  REASON FOR CONSULT: ESBL ecoli bacteremia ?pt well known to me from previous admissions  Courtney Mack is a 69 y.o. 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  Temp 98.5 F (36.9 C)  Pulse Rate 96  SpO2 98 %     Latest Reference Range & Units 07/31/24 15:12  WBC 4.0 - 10.5 K/uL 10.8 (H)  Hemoglobin 12.0 - 15.0 g/dL 9.3 (L)  HCT 63.9 - 53.9 % 31.3 (L)  Platelets 150 - 400 K/uL 265  Creatinine 0.44 - 1.00 mg/dL 8.60 (H)  Blood culture sent   Past Medical History:  Diagnosis Date   Acute bilateral deep vein thrombosis (DVT) of femoral veins (HCC) 01/02/2021   Acute massive pulmonary embolism (HCC) 08/25/2020   Last Assessment & Plan:  Formatting of this note might be different from the original. Post vena caval filter and on xarelto  and O2   Anemia    ARF (acute respiratory failure) (HCC)    Cellulitis of left lower leg 06/20/2021   CHF (congestive heart failure) (HCC)    COVID-19    Diabetes mellitus without complication (HCC)    DVT (deep venous thrombosis) (HCC) 09/06/2020   Endometrial cancer (HCC)    GAVE (gastric antral vascular ectasia)    GERD (gastroesophageal reflux disease)    High cholesterol    Hx of blood clots    Hyperlipidemia    Hypertension    IDA (iron  deficiency anemia) 09/21/2020   Obesity    Pneumonia 06/11/2022   recovered   Pressure injury of skin 06/21/2021   Pulmonary embolism (HCC)    Sepsis (HCC)    Thrombocytopenia    Urinary  tract infection 09/13/2021    Past Surgical History:  Procedure Laterality Date   CATARACT EXTRACTION W/PHACO Right 06/04/2022   Procedure: CATARACT EXTRACTION PHACO AND INTRAOCULAR LENS PLACEMENT (IOC) COMPLICATED RIGHT  31.83  02:17.4;  Surgeon: Jaye Fallow, MD;  Location: [facility];  Service: Ophthalmology;  Laterality: Right;   CATARACT EXTRACTION W/PHACO Left 07/09/2022   Procedure: CATARACT EXTRACTION PHACO AND INTRAOCULAR LENS PLACEMENT (IOC);  Surgeon: Jaye Fallow, MD;  Location: [facility];  Service: Ophthalmology;  Laterality: Left;  22.75 1:40.3   COLONOSCOPY N/A 06/21/2021   Procedure: COLONOSCOPY;  Surgeon: Toledo, Ladell POUR, MD;  Location: [facility] ENDOSCOPY;  Service: Gastroenterology;  Laterality: N/A;   CYSTOSCOPY W/ URETERAL STENT PLACEMENT Left 09/11/2021   Procedure: CYSTOSCOPY WITH RETROGRADE PYELOGRAM/URETERAL STENT PLACEMENT;  Surgeon: Twylla Glendia BROCKS, MD;  Location: [facility] ORS;  Service: Urology;  Laterality: Left;   CYSTOSCOPY W/ URETERAL STENT PLACEMENT Left 11/17/2021   Procedure: CYSTOSCOPY WITH STENT REPLACEMENT;  Surgeon: Watt Rush, MD;  Location: [facility] ORS;  Service: Urology;  Laterality: Left;   CYSTOSCOPY WITH FULGERATION  11/17/2021   Procedure: CYSTOSCOPY WITH FULGERATION;  Surgeon: Watt Rush, MD;  Location: [facility] ORS;  Service: Urology;;   CYSTOSCOPY WITH STENT PLACEMENT Left 10/17/2021   Procedure: CYSTOSCOPY WITH STENT PLACEMENT;  Surgeon: Twylla Glendia BROCKS, MD;  Location: [facility]  ORS;  Service: Urology;  Laterality: Left;   CYSTOSCOPY WITH URETEROSCOPY, STONE BASKETRY AND STENT PLACEMENT Left 10/16/2021   Procedure: CYSTOSCOPY WITH URETEROSCOPY  AND STENT REMOVAL;  Surgeon: Twylla Glendia BROCKS, MD;  Location: [facility] ORS;  Service: Urology;  Laterality: Left;   ENTEROSCOPY N/A 08/17/2021   Procedure: ENTEROSCOPY;  Surgeon: Unk Corinn Skiff, MD;  Location: [facility] ENDOSCOPY;  Service: Gastroenterology;  Laterality: N/A;    ESOPHAGOGASTRODUODENOSCOPY N/A 06/21/2021   Procedure: ESOPHAGOGASTRODUODENOSCOPY (EGD);  Surgeon: Toledo, Ladell POUR, MD;  Location: [facility];  Service: Gastroenterology;  Laterality: N/A;   ESOPHAGOGASTRODUODENOSCOPY (EGD) WITH PROPOFOL  N/A 08/17/2021   Procedure: ESOPHAGOGASTRODUODENOSCOPY (EGD) WITH PROPOFOL ;  Surgeon: Unk Corinn Skiff, MD;  Location: [facility] END[facility];  Service: Gastroenterology;  Laterality: N/A;   ESOPHAGOGASTRODUODENOSCOPY (EGD) WITH PROPOFOL  N/A 10/14/2021   Procedure: ESOPHAGOGASTRODUODENOSCOPY (EGD) WITH PROPOFOL ;  Surgeon: Jinny Carmine, MD;  Location: [facility] END[facility];  Service: Endoscopy;  Laterality: N/A;   ESOPHAGOGASTRODUODENOSCOPY (EGD) WITH PROPOFOL  N/A 02/01/2022   Procedure: ESOPHAGOGASTRODUODENOSCOPY (EGD) WITH PROPOFOL ;  Surgeon: Unk Corinn Skiff, MD;  Location: [facility] END[facility];  Service: Gastroenterology;  Laterality: N/A;   ESOPHAGOGASTRODUODENOSCOPY (EGD) WITH PROPOFOL  N/A 03/15/2022   Procedure: ESOPHAGOGASTRODUODENOSCOPY (EGD) WITH PROPOFOL ;  Surgeon: Therisa Bi, MD;  Location: [facility] ENDOSCOPY;  Service: Gastroenterology;  Laterality: N/A;   ESOPHAGOGASTRODUODENOSCOPY (EGD) WITH PROPOFOL  N/A 04/18/2022   Procedure: ESOPHAGOGASTRODUODENOSCOPY (EGD) WITH PROPOFOL ;  Surgeon: Unk Corinn Skiff, MD;  Location: [facility];  Service: Gastroenterology;  Laterality: N/A;   ESOPHAGOGASTRODUODENOSCOPY (EGD) WITH PROPOFOL  N/A 08/09/2022   Procedure: ESOPHAGOGASTRODUODENOSCOPY (EGD) WITH PROPOFOL ;  Surgeon: Therisa Bi, MD;  Location: [facility] ENDOSCOPY;  Service: Gastroenterology;  Laterality: N/A;   GIVENS CAPSULE STUDY N/A 06/22/2021   Procedure: GIVENS CAPSULE STUDY;  Surgeon: Toledo, Ladell POUR, MD;  Location: [facility] END[facility];  Service: Gastroenterology;  Laterality: N/A;   IR CONVERT LEFT NEPHROSTOMY TO NEPHROURETERAL CATH  08/29/2023   IR EXT NEPHROURETERAL CATH EXCHANGE  03/05/2024   IR NEPHROSTOMY EXCHANGE LEFT  02/25/2022   IR NEPHROSTOMY EXCHANGE LEFT  03/27/2022    IR NEPHROSTOMY EXCHANGE LEFT  05/22/2022   IR NEPHROSTOMY EXCHANGE LEFT  07/24/2022   IR NEPHROSTOMY EXCHANGE LEFT  09/24/2022   IR NEPHROSTOMY EXCHANGE LEFT  11/21/2022   IR NEPHROSTOMY EXCHANGE LEFT  02/17/2023   IR NEPHROSTOMY EXCHANGE LEFT  04/06/2023   IR NEPHROSTOMY EXCHANGE LEFT  05/27/2023   IR NEPHROSTOMY EXCHANGE LEFT  07/28/2023   IR NEPHROSTOMY EXCHANGE LEFT  05/17/2024   IR NEPHROSTOMY PLACEMENT LEFT  12/27/2021   IVC FILTER INSERTION N/A 08/18/2020   Procedure: IVC FILTER INSERTION;  Surgeon: Jama Cordella MATSU, MD;  Location: [facility] IN[facility] LAB;  Service: Cardiovascular;  Laterality: N/A;   PERIPHERAL VASCULAR THROMBECTOMY Bilateral 01/03/2021   Procedure: PERIPHERAL VASCULAR THROMBECTOMY;  Surgeon: Marea Selinda RAMAN, MD;  Location: [facility];  Service: Cardiovascular;  Laterality: Bilateral;   PULMONARY THROMBECTOMY N/A 08/18/2020   Procedure: PULMONARY THROMBECTOMY;  Surgeon: Jama Cordella MATSU, MD;  Location: [facility];  Service: Cardiovascular;  Laterality: N/A;   TUBAL LIGATION     VISCERAL ANGIOGRAPHY N/A 07/18/2021   Procedure: VISCERAL ANGIOGRAPHY;  Surgeon: Marea Selinda RAMAN, MD;  Location: [facility];  Service: Cardiovascular;  Laterality: N/A;    Social History   Socioeconomic History   Marital status: Single    Spouse name: Not on file   Number of children: Not on file   Years of education: Not on file   Highest education level: Not on file  Occupational  History   Not on file  Tobacco Use   Smoking status: Never   Smokeless tobacco: Never   Tobacco comments:    smoked 2-4 cigarettes for about 2-3 weeks   Vaping Use   Vaping status: Never Used  Substance and Sexual Activity   Alcohol  use: Not Currently   Drug use: Never   Sexual activity: Not Currently  Other Topics Concern   Not on file  Social History Narrative   Not on file   Social Drivers of Health   Tobacco Use: Low Risk (07/31/2024)   Patient History    Smoking Tobacco  Use: Never    Smokeless Tobacco Use: Never    Passive Exposure: Not on file  Financial Resource Strain: Not on file  Food Insecurity: No Food Insecurity (07/31/2024)   Epic    Worried About Programme Researcher, Broadcasting/film/video in the Last Year: Never true    Ran Out of Food in the Last Year: Never true  Recent Concern: Food Insecurity - Food Insecurity Present (05/14/2024)   Epic    Worried About Programme Researcher, Broadcasting/film/video in the Last Year: Sometimes true    [employer] of Food in the Last Year: Often true  Transportation Needs: No Transportation Needs (07/31/2024)   Epic    Lack of Transportation (Medical): No    Lack of Transportation (Non-Medical): No  Physical Activity: Not on file  Stress: Not on file  Social Connections: Socially Isolated (07/31/2024)   Social Connection and Isolation Panel    Frequency of Communication with Friends and Family: More than three times a week    Frequency of Social Gatherings with Friends and Family: Once a week    Attends Religious Services: Never    Database Administrator or Organizations: No    Attends Banker Meetings: Never    Marital Status: Divorced  Catering Manager Violence: Not At Risk (07/31/2024)   Epic    Fear of Current or Ex-Partner: No    Emotionally Abused: No    Physically Abused: No    Sexually Abused: No  Depression (PHQ2-9): Low Risk (04/11/2022)   Depression (PHQ2-9)    PHQ-2 Score: 0  Alcohol  Screen: Not on file  Housing: Low Risk (07/31/2024)   Epic    Unable to Pay for Housing in the Last Year: No    Number of Times Moved in the Last Year: 0    Homeless in the Last Year: No  Utilities: Not At Risk (07/31/2024)   Epic    Threatened with loss of utilities: No  Health Literacy: Not on file    Family History  Problem Relation Age of Onset   Hypertension Mother    Diabetes Mother    Diabetes Father    Hypertension Father    High Cholesterol Father    Congestive Heart Failure Father    Breast cancer Cousin     Allergies[1] I? Current Facility-Administered Medications  Medication Dose Route Frequency Provider Last Rate Last Admin   acetaminophen  (TYLENOL ) tablet 650 mg  650 mg Oral Q6H PRN Niu, Xilin, MD   650 mg at 08/02/24 0935   ascorbic acid  (VITAMIN C ) tablet 1,000 mg  1,000 mg Oral Daily Niu, Xilin, MD   1,000 mg at 08/03/24 9140   cyanocobalamin  (VITAMIN B12) tablet 500 mcg  500 mcg Oral Daily Niu, Xilin, MD   500 mcg at 08/03/24 0859   dextromethorphan -guaiFENesin  (MUCINEX  DM) 30-600 MG per 12 hr tablet 1 tablet  1  tablet Oral BID PRN Niu, Xilin, MD   1 tablet at 08/02/24 9062   enoxaparin  (LOVENOX ) injection 30 mg  30 mg Subcutaneous Q12H Niu, Xilin, MD   30 mg at 08/03/24 0900   fluticasone  furoate-vilanterol (BREO ELLIPTA ) 100-25 MCG/ACT 1 puff  1 puff Inhalation Daily Niu, Xilin, MD   1 puff at 08/03/24 9090   furosemide  (LASIX ) tablet 20 mg  20 mg Oral Daily Sreenath, Sudheer B, MD   20 mg at 08/03/24 9046   ipratropium-albuterol  (DUONEB) 0.5-2.5 (3) MG/3ML nebulizer solution 3 mL  3 mL Nebulization Q4H PRN Jhonny Sahara B, MD       iron  polysaccharides (NIFEREX) capsule 150 mg  150 mg Oral Daily Niu, Xilin, MD   150 mg at 08/03/24 9140   liver oil-zinc  oxide (DESITIN) 40 % ointment   Topical BID Jhonny Sahara B, MD   Given at 08/03/24 9090   loperamide  (IMODIUM ) capsule 2 mg  2 mg Oral PRN Niu, Xilin, MD       meropenem  (MERREM ) 1 g in sodium chloride  0.9 % 100 mL IVPB  1 g Intravenous Q8H Sreenath, Sudheer B, MD       morphine  (PF) 2 MG/ML injection 2 mg  2 mg Intravenous Q3H PRN Sreenath, Sudheer B, MD   2 mg at 08/03/24 1018   multivitamin with minerals tablet 1 tablet  1 tablet Oral Daily Niu, Xilin, MD   1 tablet at 08/03/24 0859   ondansetron  (ZOFRAN ) injection 4 mg  4 mg Intravenous Q8H PRN Niu, Xilin, MD       pantoprazole  (PROTONIX ) EC tablet 40 mg  40 mg Oral BID Niu, Xilin, MD   40 mg at 08/03/24 9140   rosuvastatin  (CRESTOR ) tablet 10 mg  10 mg Oral Daily Niu,  Xilin, MD   10 mg at 08/03/24 9140   trimethoprim  (TRIMPEX ) tablet 100 mg  100 mg Oral QHS Sreenath, Sudheer B, MD         Abtx:  Anti-infectives (From admission, onward)    Start     Dose/Rate Route Frequency Ordered Stop   08/03/24 2200  trimethoprim  (TRIMPEX ) tablet 100 mg        100 mg Oral Daily at bedtime 08/03/24 0917     08/03/24 2200  meropenem  (MERREM ) 1 g in sodium chloride  0.9 % 100 mL IVPB        1 g 200 mL/hr over 30 Minutes Intravenous Every 8 hours 08/03/24 1516     07/31/24 2200  meropenem  (MERREM ) 1 g in sodium chloride  0.9 % 100 mL IVPB  Status:  Discontinued        1 g 200 mL/hr over 30 Minutes Intravenous Every 12 hours 07/31/24 1909 08/03/24 1516   07/31/24 1545  cefTRIAXone  (ROCEPHIN ) 2 g in sodium chloride  0.9 % 100 mL IVPB        2 g 200 mL/hr over 30 Minutes Intravenous Once 07/31/24 1540 07/31/24 1619   07/31/24 1545  azithromycin  (ZITHROMAX ) 500 mg in sodium chloride  0.9 % 250 mL IVPB        500 mg 250 mL/hr over 60 Minutes Intravenous  Once 07/31/24 1540 07/31/24 1724       REVIEW OF SYSTEMS:  Const: negative fever, negative chills, negative weight loss Eyes: negative diplopia or visual changes, negative eye pain ENT: negative coryza, negative sore throat Resp: negative cough, hemoptysis, dyspnea Cards: negative for chest pain, palpitations, lower extremity edema GU: negative for frequency, dysuria and hematuria GI: Negative for  abdominal pain, diarrhea, bleeding, constipation Skin: negative for rash and pruritus Heme: negative for easy bruising and gum/nose bleeding MS: negative for myalgias, arthralgias, back pain and muscle weakness Neurolo:negative for headaches, dizziness, vertigo, memory problems  Psych: negative for feelings of anxiety, depression  Endocrine: negative for thyroid, diabetes Allergy/Immunology- negative for any medication or food allergies ? Pertinent Positives include : Objective:  VITALS:  BP (!) 110/51 (BP Location:  Right Arm)   Pulse 83   Temp 99.9 F (37.7 C) (Oral)   Resp 16   Ht 4' 11 (1.499 m)   Wt 100.5 kg   SpO2 95%   BMI 44.75 kg/m  LDA Foley Central line Other drainage tubes PHYSICAL EXAM:  General: Alert, cooperative, no distress, appears stated age.  Head: Normocephalic, without obvious abnormality, atraumatic. Eyes: Conjunctivae clear, anicteric sclerae. Pupils are equal ENT Nares normal. No drainage or sinus tenderness. Lips, mucosa, and tongue normal. No Thrush Neck: Supple, symmetrical, no adenopathy, thyroid: non tender no carotid bruit and no JVD. Back: No CVA tenderness. Lungs: Clear to auscultation bilaterally. No Wheezing or Rhonchi. No rales. Heart: Regular rate and rhythm, no murmur, rub or gallop. Abdomen: Soft, non-tender,not distended. Bowel sounds normal. No masses Extremities: atraumatic, no cyanosis. No edema. No clubbing Skin: No rashes or lesions. Or bruising Lymph: Cervical, supraclavicular normal. Neurologic: Grossly non-focal Pertinent Labs Lab Results CBC    Component Value Date/Time   WBC 3.6 (L) 08/03/2024 1218   RBC 2.71 (L) 08/03/2024 1218   HGB 7.4 (L) 08/03/2024 1218   HGB 9.8 (L) 03/24/2023 1351   HCT 25.1 (L) 08/03/2024 1218   PLT 221 08/03/2024 1218   PLT 256 03/24/2023 1351   MCV 92.6 08/03/2024 1218   MCH 27.3 08/03/2024 1218   MCHC 29.5 (L) 08/03/2024 1218   RDW 17.7 (H) 08/03/2024 1218   LYMPHSABS 0.7 08/03/2024 1218   MONOABS 0.3 08/03/2024 1218   EOSABS 0.1 08/03/2024 1218   BASOSABS 0.0 08/03/2024 1218       Latest Ref Rng & Units 08/03/2024   12:18 PM 08/01/2024    5:08 AM 07/31/2024    3:12 PM  CMP  Glucose 70 - 99 mg/dL 880  871  843   BUN 8 - 23 mg/dL 23  19  20    Creatinine 0.44 - 1.00 mg/dL 8.97  8.65  8.60   Sodium 135 - 145 mmol/L 136  137  137   Potassium 3.5 - 5.1 mmol/L 4.1  4.5  5.2   Chloride 98 - 111 mmol/L 106  105  104   CO2 22 - 32 mmol/L 23  24  21    Calcium  8.9 - 10.3 mg/dL 8.6  8.9  9.2   Total  Protein 6.5 - 8.1 g/dL   6.7   Total Bilirubin 0.0 - 1.2 mg/dL   1.2   Alkaline Phos 38 - 126 U/L   63   AST 15 - 41 U/L   43   ALT 0 - 44 U/L   13       Microbiology: Recent Results (from the past 240 hours)  Culture, blood (routine x 2)     Status: Abnormal   Collection Time: 07/31/24  3:12 PM   Specimen: BLOOD  Result Value Ref Range Status   Specimen Description   Final    BLOOD Performed at [facility], [address], [place], [place] [address]    Special Requests   Final    BOTTLES DRAWN AEROBIC AND  ANAEROBIC Blood Culture results may not be optimal due to an inadequate volume of blood received in culture bottles Performed at [facility], [address] [place], [place] [address]    Culture  Setup Time   Final    GRAM NEGATIVE RODS AEROBIC BOTTLE ONLY CRITICAL RESULT CALLED TO, READ BACK BY AND VERIFIED WITH: WALID NAZARI ON 08/01/24 AT 1033 QSD Performed at [facility], [address], [place], [place] [address]    Culture (A)  Final    ESCHERICHIA COLI Confirmed Extended Spectrum Beta-Lactamase Producer (ESBL).  In bloodstream infections from ESBL organisms, carbapenems are preferred over piperacillin /tazobactam. They are shown to have a lower risk of mortality.    Report Status 08/03/2024 FINAL  Final   Organism ID, Bacteria ESCHERICHIA COLI  Final      Susceptibility   Escherichia coli - MIC*    AMPICILLIN  >=32 RESISTANT Resistant     CEFAZOLIN  (NON-URINE) >=32 RESISTANT Resistant     CEFEPIME  16 RESISTANT Resistant     ERTAPENEM  <=0.12 SENSITIVE Sensitive     CEFTRIAXONE  >=64 RESISTANT Resistant     CIPROFLOXACIN >=4 RESISTANT Resistant     GENTAMICIN  <=1 SENSITIVE Sensitive     MEROPENEM  <=0.25 SENSITIVE Sensitive     TRIMETH /SULFA  >=320 RESISTANT Resistant     AMPICILLIN /SULBACTAM >=32 RESISTANT Resistant     PIP/TAZO Value in next row Sensitive      8 SENSITIVEThis is a modified FDA-approved test that has been validated and  its performance characteristics determined by the reporting laboratory.  This laboratory is certified under the Clinical Laboratory Improvement Amendments CLIA as qualified to perform high complexity clinical laboratory testing.    * ESCHERICHIA COLI  Culture, blood (routine x 2)     Status: None (Preliminary result)   Collection Time: 07/31/24  3:12 PM   Specimen: BLOOD  Result Value Ref Range Status   Specimen Description BLOOD  Final   Special Requests   Final    BOTTLES DRAWN AEROBIC AND ANAEROBIC Blood Culture results may not be optimal due to an inadequate volume of blood received in culture bottles   Culture   Final    NO GROWTH 3 DAYS Performed at [facility], [address] [place], [place] [address]    Report Status PENDING  Incomplete  Resp panel by RT-PCR (RSV, Flu A&B, Covid) Anterior Nasal Swab     Status: None   Collection Time: 07/31/24  3:12 PM   Specimen: Anterior Nasal Swab  Result Value Ref Range Status   SARS Coronavirus 2 by RT PCR NEGATIVE NEGATIVE Final    Comment: (NOTE) SARS-CoV-2 target nucleic acids are NOT DETECTED.  The SARS-CoV-2 RNA is generally detectable in upper respiratory specimens during the acute phase of infection. The lowest concentration of SARS-CoV-2 viral copies this assay can detect is 138 copies/mL. A negative result does not preclude SARS-Cov-2 infection and should not be used as the sole basis for treatment or other patient management decisions. A negative result may occur with  improper specimen collection/handling, submission of specimen other than nasopharyngeal swab, presence of viral mutation(s) within the areas targeted by this assay, and inadequate number of viral copies(<138 copies/mL). A negative result must be combined with clinical observations, patient history, and epidemiological information. The expected result is Negative.  Fact Sheet for Patients:  bloggercourse.com  Fact Sheet  for Healthcare Providers:  seriousbroker.it  This test is no t yet approved or cleared by the [place]  FDA and  has been authorized for detection and/or diagnosis of SARS-CoV-2 by FDA under an Emergency Use Authorization (EUA). This EUA will remain  in effect (meaning this test can be used) for the duration of the COVID-19 declaration under Section 564(b)(1) of the Act, 21 U.S.C.section 360bbb-3(b)(1), unless the authorization is terminated  or revoked sooner.       Influenza A by PCR NEGATIVE NEGATIVE Final   Influenza B by PCR NEGATIVE NEGATIVE Final    Comment: (NOTE) The Xpert Xpress SARS-CoV-2/FLU/RSV plus assay is intended as an aid in the diagnosis of influenza from Nasopharyngeal swab specimens and should not be used as a sole basis for treatment. Nasal washings and aspirates are unacceptable for Xpert Xpress SARS-CoV-2/FLU/RSV testing.  Fact Sheet for Patients: bloggercourse.com  Fact Sheet for Healthcare Providers: seriousbroker.it  This test is not yet approved or cleared by the [place]  FDA and has been authorized for detection and/or diagnosis of SARS-CoV-2 by FDA under an Emergency Use Authorization (EUA). This EUA will remain in effect (meaning this test can be used) for the duration of the COVID-19 declaration under Section 564(b)(1) of the Act, 21 U.S.C. section 360bbb-3(b)(1), unless the authorization is terminated or revoked.     Resp Syncytial Virus by PCR NEGATIVE NEGATIVE Final    Comment: (NOTE) Fact Sheet for Patients: bloggercourse.com  Fact Sheet for Healthcare Providers: seriousbroker.it  This test is not yet approved or cleared by the [place]  FDA and has been authorized for detection and/or diagnosis of SARS-CoV-2 by FDA under an Emergency Use Authorization (EUA). This EUA will remain in effect (meaning  this test can be used) for the duration of the COVID-19 declaration under Section 564(b)(1) of the Act, 21 U.S.C. section 360bbb-3(b)(1), unless the authorization is terminated or revoked.  Performed at [facility], [address] [place], [place] [address]   Blood Culture ID Panel (Reflexed)     Status: Abnormal   Collection Time: 07/31/24  3:12 PM  Result Value Ref Range Status   Enterococcus faecalis NOT DETECTED NOT DETECTED Final   Enterococcus Faecium NOT DETECTED NOT DETECTED Final   Listeria monocytogenes NOT DETECTED NOT DETECTED Final   Staphylococcus species NOT DETECTED NOT DETECTED Final   Staphylococcus aureus (BCID) NOT DETECTED NOT DETECTED Final   Staphylococcus epidermidis NOT DETECTED NOT DETECTED Final   Staphylococcus lugdunensis NOT DETECTED NOT DETECTED Final   Streptococcus species NOT DETECTED NOT DETECTED Final   Streptococcus agalactiae NOT DETECTED NOT DETECTED Final   Streptococcus pneumoniae NOT DETECTED NOT DETECTED Final   Streptococcus pyogenes NOT DETECTED NOT DETECTED Final   A.calcoaceticus-baumannii NOT DETECTED NOT DETECTED Final   Bacteroides fragilis NOT DETECTED NOT DETECTED Final   Enterobacterales DETECTED (A) NOT DETECTED Final    Comment: Enterobacterales represent a large order of gram negative bacteria, not a single organism. CRITICAL RESULT CALLED TO, READ BACK BY AND VERIFIED WITH: WALID NAZARI ON 08/01/24 AT 1033 QSD    Enterobacter cloacae complex NOT DETECTED NOT DETECTED Final   Escherichia coli DETECTED (A) NOT DETECTED Final    Comment: CRITICAL RESULT CALLED TO, READ BACK BY AND VERIFIED WITH: WALID NAZARI ON 08/01/24 AT 1033 QSD    Klebsiella aerogenes NOT DETECTED NOT DETECTED Final   Klebsiella oxytoca NOT DETECTED NOT DETECTED Final   Klebsiella pneumoniae NOT DETECTED NOT DETECTED Final   Proteus species NOT DETECTED NOT DETECTED Final   Salmonella species NOT DETECTED NOT DETECTED Final   Serratia  marcescens NOT DETECTED NOT DETECTED Final  Haemophilus influenzae NOT DETECTED NOT DETECTED Final   Neisseria meningitidis NOT DETECTED NOT DETECTED Final   Pseudomonas aeruginosa NOT DETECTED NOT DETECTED Final   Stenotrophomonas maltophilia NOT DETECTED NOT DETECTED Final   Candida albicans NOT DETECTED NOT DETECTED Final   Candida auris NOT DETECTED NOT DETECTED Final   Candida glabrata NOT DETECTED NOT DETECTED Final   Candida krusei NOT DETECTED NOT DETECTED Final   Candida parapsilosis NOT DETECTED NOT DETECTED Final   Candida tropicalis NOT DETECTED NOT DETECTED Final   Cryptococcus neoformans/gattii NOT DETECTED NOT DETECTED Final   CTX-M ESBL DETECTED (A) NOT DETECTED Final    Comment: CRITICAL RESULT CALLED TO, READ BACK BY AND VERIFIED WITH: WALID NAZARI ON 08/01/24 AT 1033 QSD (NOTE) Extended spectrum beta-lactamase detected. Recommend a carbapenem as initial therapy.      Carbapenem resistance IMP NOT DETECTED NOT DETECTED Final   Carbapenem resistance KPC NOT DETECTED NOT DETECTED Final   Carbapenem resistance NDM NOT DETECTED NOT DETECTED Final   Carbapenem resist OXA 48 LIKE NOT DETECTED NOT DETECTED Final   Carbapenem resistance VIM NOT DETECTED NOT DETECTED Final    Comment: Performed at [facility], [address] [place], [place] [address]  Urine Culture (for pregnant, neutropenic or urologic patients or patients with an indwelling urinary catheter)     Status: Abnormal   Collection Time: 07/31/24  5:35 PM   Specimen: Urine, Random  Result Value Ref Range Status   Specimen Description   Final    URINE, RANDOM Performed at [facility], [address] [place], [place] [address]    Special Requests   Final    NONE Performed at [facility], [address] [place], [place] [address]    Culture (A)  Final    >=100,000 COLONIES/mL ESCHERICHIA COLI Confirmed Extended Spectrum Beta-Lactamase Producer (ESBL).  In bloodstream  infections from ESBL organisms, carbapenems are preferred over piperacillin /tazobactam. They are shown to have a lower risk of mortality.    Report Status 08/03/2024 FINAL  Final   Organism ID, Bacteria ESCHERICHIA COLI (A)  Final      Susceptibility   Escherichia coli - MIC*    AMPICILLIN  >=32 RESISTANT Resistant     CEFAZOLIN  (URINE) Value in next row Resistant      >=32 RESISTANTThis is a modified FDA-approved test that has been validated and its performance characteristics determined by the reporting laboratory.  This laboratory is certified under the Clinical Laboratory Improvement Amendments CLIA as qualified to perform high complexity clinical laboratory testing.    CEFEPIME  Value in next row Resistant      >=32 RESISTANTThis is a modified FDA-approved test that has been validated and its performance characteristics determined by the reporting laboratory.  This laboratory is certified under the Clinical Laboratory Improvement Amendments CLIA as qualified to perform high complexity clinical laboratory testing.    ERTAPENEM  Value in next row Sensitive      >=32 RESISTANTThis is a modified FDA-approved test that has been validated and its performance characteristics determined by the reporting laboratory.  This laboratory is certified under the Clinical Laboratory Improvement Amendments CLIA as qualified to perform high complexity clinical laboratory testing.    CEFTRIAXONE  Value in next row Resistant      >=32 RESISTANTThis is a modified FDA-approved test that has been validated and its performance characteristics determined by the reporting laboratory.  This laboratory is certified under the Clinical Laboratory Improvement Amendments CLIA as qualified to perform high complexity clinical laboratory  testing.    CIPROFLOXACIN Value in next row Resistant      >=32 RESISTANTThis is a modified FDA-approved test that has been validated and its performance characteristics determined by the reporting  laboratory.  This laboratory is certified under the Clinical Laboratory Improvement Amendments CLIA as qualified to perform high complexity clinical laboratory testing.    GENTAMICIN  Value in next row Sensitive      >=32 RESISTANTThis is a modified FDA-approved test that has been validated and its performance characteristics determined by the reporting laboratory.  This laboratory is certified under the Clinical Laboratory Improvement Amendments CLIA as qualified to perform high complexity clinical laboratory testing.    NITROFURANTOIN Value in next row Sensitive      >=32 RESISTANTThis is a modified FDA-approved test that has been validated and its performance characteristics determined by the reporting laboratory.  This laboratory is certified under the Clinical Laboratory Improvement Amendments CLIA as qualified to perform high complexity clinical laboratory testing.    TRIMETH /SULFA  Value in next row Resistant      >=32 RESISTANTThis is a modified FDA-approved test that has been validated and its performance characteristics determined by the reporting laboratory.  This laboratory is certified under the Clinical Laboratory Improvement Amendments CLIA as qualified to perform high complexity clinical laboratory testing.    AMPICILLIN /SULBACTAM Value in next row Resistant      >=32 RESISTANTThis is a modified FDA-approved test that has been validated and its performance characteristics determined by the reporting laboratory.  This laboratory is certified under the Clinical Laboratory Improvement Amendments CLIA as qualified to perform high complexity clinical laboratory testing.    PIP/TAZO Value in next row Sensitive      16 SENSITIVEThis is a modified FDA-approved test that has been validated and its performance characteristics determined by the reporting laboratory.  This laboratory is certified under the Clinical Laboratory Improvement Amendments CLIA as qualified to perform high complexity clinical  laboratory testing.    MEROPENEM  Value in next row Sensitive      16 SENSITIVEThis is a modified FDA-approved test that has been validated and its performance characteristics determined by the reporting laboratory.  This laboratory is certified under the Clinical Laboratory Improvement Amendments CLIA as qualified to perform high complexity clinical laboratory testing.    * >=100,000 COLONIES/mL ESCHERICHIA COLI     Patient has: []  acute illness w/systemic sxs  [mod] []  illness posing risk to life or function  [high]  I reviewed:  (3+) []  primary team note []  consultant note(s) []  procedure/op note(s) []  micro result(s)   []  CBC results []  chemistry results []  radiology report(s) []  nursing note(s)  I independently visualized:  (any)   []  cxs/plates in lab []  plain film images []  CT images []  PET images   []  path slide(s) []  ECG tracing []  MRI images []  nuclear scan  I discussed: (any) []  micro and/or path w/lab personnel []  drug options and/or interactions w/ID pharmD   []  procedure/OR findings w/other MD(s) []  echo and/or imaging w/other MD(s)   []  mgm't w/attending(s) involved in case []  setting up home abx w/OPAT team  Mgm't requires: []  prescription drug(s)  [mod] []  intensive toxicity monitoring  [high]   IMAGING RESULTS: Ct abdomen and pelvis : left hydronephrosis and hydroureter with perinephric stranding on the left side  I have personally reviewed the films  EKG Sinus rhythm- personally reviewed   Patient has: []  acute illness w/systemic sxs  [mod] [x]  illness posing risk to life or function  [high]  I reviewed:  (3+) [x]  primary team note [x]  consultant note(s) []  procedure/op note(s) []  micro result(s)   [x]  CBC results [x]  chemistry results [x]  radiology report(s) []  nursing note(s)  I independently visualized:  (any)   []  cxs/plates in lab [x]  plain film images [x]  CT images []  PET images   []  path slide(s) [x]  ECG tracing []  MRI images []  nuclear scan  I discussed:  (any) []  micro and/or path w/lab personnel [x]  drug options and/or interactions w/ID pharmD   []  procedure/OR findings w/other MD(s) []  echo and/or imaging w/other MD(s)   [x]  mgm't w/attending(s) involved in case []  setting up home abx w/OPAT team  Mgm't requires: []  prescription drug(s)  [mod] [x]  intensive toxicity monitoring  [high]   ? Impression/Recommendation ? 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

## 2024-08-03 NOTE — Progress Notes (Signed)
 PT Cancellation Note  Patient Details Name: Courtney Mack MRN: [id number] DOB: 16-Jan-1956   Cancelled Treatment:     PT attempt. 2nd attempt today. First attempt pt requested author return after lunch. This attempt, pt c/o chest pain and is awaiting x ray results. Acute PT will continue efforts to progress pt per current POC.    [name] 08/03/2024, 12:35 PM

## 2024-08-03 NOTE — Plan of Care (Signed)
  Problem: Clinical Measurements: Goal: Ability to maintain clinical measurements within normal limits will improve Outcome: Progressing Goal: Diagnostic test results will improve Outcome: Progressing Goal: Respiratory complications will improve Outcome: Progressing Goal: Cardiovascular complication will be avoided Outcome: Progressing   Problem: Activity: Goal: Risk for activity intolerance will decrease Outcome: Progressing   Problem: Nutrition: Goal: Adequate nutrition will be maintained Outcome: Progressing   Problem: Coping: Goal: Level of anxiety will decrease Outcome: Progressing   Problem: Elimination: Goal: Will not experience complications related to bowel motility Outcome: Progressing Goal: Will not experience complications related to urinary retention Outcome: Progressing   Problem: Pain Managment: Goal: General experience of comfort will improve and/or be controlled Outcome: Progressing   Problem: Safety: Goal: Ability to remain free from injury will improve Outcome: Progressing

## 2024-08-03 NOTE — Progress Notes (Signed)
 Mobility Specialist - Progress Note   08/03/24 1609  Mobility  Activity Dangled on edge of bed  Level of Assistance Maximum assist, patient does 25-49%  Assistive Device None  Activity Response Tolerated well  Mobility visit 1 Mobility  Mobility Specialist Start Time (ACUTE ONLY) 1523  Mobility Specialist Stop Time (ACUTE ONLY) 1608  Mobility Specialist Time Calculation (min) (ACUTE ONLY) 45 min   Pt supine upon entry, utilizing RA-- expressed min chest pain in comparison to earlier. MS assist NT with peri care-- MaxA. She transferred to the EOB requiring MaxA, sat for ~5 mins before returning to bed-- denied further mobility. Pt left supine with alarm set and needs within reach.  America Silvan Mobility Specialist 08/03/24 4:19 PM

## 2024-08-03 NOTE — Progress Notes (Signed)
 Patient returned from interventional radiology , new nephrostomy intact and draining to left side.

## 2024-08-03 NOTE — Progress Notes (Signed)
 PHARMACY NOTE:  ANTIMICROBIAL RENAL DOSAGE ADJUSTMENT  Current antimicrobial regimen includes a mismatch between antimicrobial dosage and estimated renal function.  As per policy approved by the Pharmacy & Therapeutics and Medical Executive Committees, the antimicrobial dosage will be adjusted accordingly.  Current antimicrobial dosage:  Meropenem  1gm IV q 12 hours  Indication: Complicated UTI and Bacteremia  Renal Function:  Estimated Creatinine Clearance: 54.3 mL/min (A) (by C-G formula based on SCr of 1.02 mg/dL (H)).     Antimicrobial dosage has been changed to:  Meropenem  1g q 8hrs  Additional comments: Waiting for additional ID input. Frequency increase based on Scr improvement.   Avonda Toso Rodriguez-Guzman PharmD, BCPS 08/03/2024 3:21 PM

## 2024-08-03 NOTE — Progress Notes (Signed)
 " PROGRESS NOTE    Courtney Mack  FMW:[id number] DOB: 13-May-1956 DOA: 07/31/2024 PCP: [facility]    Brief Narrative:  69 y.o. female with medical history significant of left-sided hydronephrosis status post nephrostomy tube placement, recurrent UTI and sepsis due to UTI, ESBL, hypertension, COPD, history of extensive bilateral PE and DVT (s/p of IVC filter, on lovenox ), dCHF, who presents with weakness, dysuria and shortness of breath.   Patient states that she she feels fatigued and tired today.  She has dysuria and burning on urination.  No urinary frequency and gross hematuria.  Patient has chills, no fever.  She also reports SOB on exertion earlier, which has resolved currently.  Patient has mild dry cough, no chest pain.  No sore throat or runny nose.  Patient does not have nausea vomiting, diarrhea or abdominal pain.  No respiratory distress.  Assessment & Plan:   Principal Problem:   Complicated UTI (urinary tract infection) Active Problems:   AKI (acute kidney injury)   Hyperkalemia   Chronic diastolic CHF (congestive heart failure) (HCC)   COPD (chronic obstructive pulmonary disease) (HCC)   Hypertension   HLD (hyperlipidemia)   Recurrent deep vein thrombosis (DVT) (HCC)   History of pulmonary embolism/DVT s/p IVC filter   Iron  deficiency anemia   Obesity, Class III, BMI 40-49.9 (morbid obesity) (HCC)  Complicated UTI (urinary tract infection) ESBL E. coli bacteremia Blood and urine cultures positive for ESBL E. coli.  CT abdomen pelvis with appropriately placed nephrostomy but mild hydronephrosis and hydroureter.  No evidence of obstructive uropathy. Plan: Continue IV meropenem  ID consult.  Case discussed with Dr. Searcy Monitor vitals and fever curve  AKI (acute kidney injury):  Baseline creatinine 0.65 on 05/20/2024.  Creatinine elevated on arrival Plan: Patient has been on IVF for 48 hours.  Discontinue IV fluids.  Recheck  creatinine to ensure resolution of AKI.  Lasix  resumed at home dose 20 mg daily.  Hyperkalemia, resolved  Potassium 5.2.  On arrival Lasix  Responded to IV fluids and Lokelma    Chronic diastolic CHF (congestive heart failure) (HCC):  2D echo on 11/21/2022 showed EF of 60-65% with grade 1 diastolic dysfunction.  Patient does not have leg edema.  CHF seems to be compensated. Plan: Lasix  resumed 1/27 Hold IVF   COPD (chronic obstructive pulmonary disease) [facility]):  Patient reports shortness of breath earlier, which has improved in ED.  No oxygen desaturation.  PCR negative for COVID, flu and RSV.  Chest x-ray negative.  BNP 510, no leg edema, does not seem to have COPD exacerbation. Plan: Bronchodilators as needed Mucinex    Hypertension:  Blood pressure is soft Plan: Resume Lasix    HLD: Continue Crestor    Recurrent deep vein thrombosis (DVT) (HCC) and History of pulmonary embolism/DVT s/p IVC filter: Home weight-based Lovenox    Iron  deficiency anemia:  Hemoglobin stable 9.3 (8.5 on 05/20/2024) P.o. iron    Obesity, Class III, BMI 40-49.9 (morbid obesity) (HCC):  Patient has Obesity Class III, with body weight  97.1 Kg and BMI 43.24 kg/m2.  Complicating factor in overall care and prognosis   DVT prophylaxis: Lovenox  Code Status: Full Family Communication: None Disposition Plan: Status is: Inpatient Remains inpatient appropriate because: Sepsis secondary to UTI   Level of care: Telemetry  Consultants:  None  Procedures:  None  Antimicrobials: Meropenem    Subjective: Seen and examined.  Resting in bed.  Reports some chest discomfort this morning.  Objective: Vitals:   08/02/24 2118 08/03/24 0420 08/03/24 0446 08/03/24  0738  BP: (!) 101/56 (!) 120/55  (!) 110/51  Pulse: 77 87  83  Resp: 17 18  16   Temp: 99 F (37.2 C) 98.4 F (36.9 C)  99.9 F (37.7 C)  TempSrc: Oral Oral  Oral  SpO2: 96% 97%  95%  Weight:   100.5 kg   Height:        Intake/Output  Summary (Last 24 hours) at 08/03/2024 1120 Last data filed at 08/03/2024 0949 Gross per 24 hour  Intake 1320 ml  Output 1400 ml  Net -80 ml   Filed Weights   08/01/24 0500 08/02/24 0500 08/03/24 0446  Weight: 97 kg 97 kg 100.5 kg    Examination:  General exam: Fatigued and chronically ill Respiratory system: Clear.  Normal work of breathing.  Room air Cardiovascular system: S1 S2, RRR, no murmurs, no pedal edema Gastrointestinal system: Soft, NT/ND, normal bowel sounds, nephrostomy in place Central nervous system: Alert and oriented. No focal neurological deficits. Extremities: Symmetric 5 x 5 power. Skin: No rashes, lesions or ulcers Psychiatry: Judgement and insight appear normal. Mood & affect appropriate.     Data Reviewed: I have personally reviewed following labs and imaging studies  CBC: Recent Labs  Lab 07/31/24 1512 08/01/24 0508  WBC 10.8* 11.0*  HGB 9.3* 8.7*  HCT 31.3* 29.0*  MCV 91.8 93.5  PLT 265 247   Basic Metabolic Panel: Recent Labs  Lab 07/31/24 1512 08/01/24 0508  NA 137 137  K 5.2* 4.5  CL 104 105  CO2 21* 24  GLUCOSE 156* 128*  BUN 20 19  CREATININE 1.39* 1.34*  CALCIUM  9.2 8.9   GFR: Estimated Creatinine Clearance: 41.3 mL/min (A) (by C-G formula based on SCr of 1.34 mg/dL (H)). Liver Function Tests: Recent Labs  Lab 07/31/24 1512  AST 43*  ALT 13  ALKPHOS 63  BILITOT 1.2  PROT 6.7  ALBUMIN 3.3*   No results for input(s): LIPASE, AMYLASE in the last 168 hours. No results for input(s): AMMONIA in the last 168 hours. Coagulation Profile: Recent Labs  Lab 07/31/24 1511  INR 1.1   Cardiac Enzymes: No results for input(s): CKTOTAL, CKMB, CKMBINDEX, TROPONINI in the last 168 hours. BNP (last 3 results) Recent Labs    07/31/24 1511  PROBNP 510.0*   HbA1C: No results for input(s): HGBA1C in the last 72 hours. CBG: Recent Labs  Lab 08/01/24 2356  GLUCAP 97   Lipid Profile: No results for input(s):  CHOL, HDL, LDLCALC, TRIG, CHOLHDL, LDLDIRECT in the last 72 hours. Thyroid Function Tests: No results for input(s): TSH, T4TOTAL, FREET4, T3FREE, THYROIDAB in the last 72 hours. Anemia Panel: No results for input(s): VITAMINB12, FOLATE, FERRITIN, TIBC, IRON , RETICCTPCT in the last 72 hours. Sepsis Labs: Recent Labs  Lab 07/31/24 1735 07/31/24 2020 07/31/24 2235 08/01/24 0110  LATICACIDVEN 2.4* 1.8 1.1 1.2    Recent Results (from the past 240 hours)  Culture, blood (routine x 2)     Status: Abnormal   Collection Time: 07/31/24  3:12 PM   Specimen: BLOOD  Result Value Ref Range Status   Specimen Description   Final    BLOOD Performed at [facility], [address] [place], [place] [address]    Special Requests   Final    BOTTLES DRAWN AEROBIC AND ANAEROBIC Blood Culture results may not be optimal due to an inadequate volume of blood received in culture bottles Performed at [facility], [address], [place], [place] [address]  Culture  Setup Time   Final    GRAM NEGATIVE RODS AEROBIC BOTTLE ONLY CRITICAL RESULT CALLED TO, READ BACK BY AND VERIFIED WITH: WALID NAZARI ON 08/01/24 AT 1033 QSD Performed at [facility], [address], [place], [place] [address]    Culture (A)  Final    ESCHERICHIA COLI Confirmed Extended Spectrum Beta-Lactamase Producer (ESBL).  In bloodstream infections from ESBL organisms, carbapenems are preferred over piperacillin /tazobactam. They are shown to have a lower risk of mortality.    Report Status 08/03/2024 FINAL  Final   Organism ID, Bacteria ESCHERICHIA COLI  Final      Susceptibility   Escherichia coli - MIC*    AMPICILLIN  >=32 RESISTANT Resistant     CEFAZOLIN  (NON-URINE) >=32 RESISTANT Resistant     CEFEPIME  16 RESISTANT Resistant     ERTAPENEM  <=0.12 SENSITIVE Sensitive     CEFTRIAXONE  >=64 RESISTANT Resistant     CIPROFLOXACIN >=4 RESISTANT Resistant     GENTAMICIN  <=1  SENSITIVE Sensitive     MEROPENEM  <=0.25 SENSITIVE Sensitive     TRIMETH /SULFA  >=320 RESISTANT Resistant     AMPICILLIN /SULBACTAM >=32 RESISTANT Resistant     PIP/TAZO Value in next row Sensitive      8 SENSITIVEThis is a modified FDA-approved test that has been validated and its performance characteristics determined by the reporting laboratory.  This laboratory is certified under the Clinical Laboratory Improvement Amendments CLIA as qualified to perform high complexity clinical laboratory testing.    * ESCHERICHIA COLI  Culture, blood (routine x 2)     Status: None (Preliminary result)   Collection Time: 07/31/24  3:12 PM   Specimen: BLOOD  Result Value Ref Range Status   Specimen Description BLOOD  Final   Special Requests   Final    BOTTLES DRAWN AEROBIC AND ANAEROBIC Blood Culture results may not be optimal due to an inadequate volume of blood received in culture bottles   Culture   Final    NO GROWTH 3 DAYS Performed at [facility], [address], [place], [place] [address]    Report Status PENDING  Incomplete  Resp panel by RT-PCR (RSV, Flu A&B, Covid) Anterior Nasal Swab     Status: None   Collection Time: 07/31/24  3:12 PM   Specimen: Anterior Nasal Swab  Result Value Ref Range Status   SARS Coronavirus 2 by RT PCR NEGATIVE NEGATIVE Final    Comment: (NOTE) SARS-CoV-2 target nucleic acids are NOT DETECTED.  The SARS-CoV-2 RNA is generally detectable in upper respiratory specimens during the acute phase of infection. The lowest concentration of SARS-CoV-2 viral copies this assay can detect is 138 copies/mL. A negative result does not preclude SARS-Cov-2 infection and should not be used as the sole basis for treatment or other patient management decisions. A negative result may occur with  improper specimen collection/handling, submission of specimen other than nasopharyngeal swab, presence of viral mutation(s) within the areas targeted by this assay, and  inadequate number of viral copies(<138 copies/mL). A negative result must be combined with clinical observations, patient history, and epidemiological information. The expected result is Negative.  Fact Sheet for Patients:  bloggercourse.com  Fact Sheet for Healthcare Providers:  seriousbroker.it  This test is no t yet approved or cleared by the [place]  FDA and  has been authorized for detection and/or diagnosis of SARS-CoV-2 by FDA under an Emergency Use Authorization (EUA). This EUA will remain  in effect (meaning this test can be used) for the duration  of the COVID-19 declaration under Section 564(b)(1) of the Act, 21 U.S.C.section 360bbb-3(b)(1), unless the authorization is terminated  or revoked sooner.       Influenza A by PCR NEGATIVE NEGATIVE Final   Influenza B by PCR NEGATIVE NEGATIVE Final    Comment: (NOTE) The Xpert Xpress SARS-CoV-2/FLU/RSV plus assay is intended as an aid in the diagnosis of influenza from Nasopharyngeal swab specimens and should not be used as a sole basis for treatment. Nasal washings and aspirates are unacceptable for Xpert Xpress SARS-CoV-2/FLU/RSV testing.  Fact Sheet for Patients: bloggercourse.com  Fact Sheet for Healthcare Providers: seriousbroker.it  This test is not yet approved or cleared by the [place]  FDA and has been authorized for detection and/or diagnosis of SARS-CoV-2 by FDA under an Emergency Use Authorization (EUA). This EUA will remain in effect (meaning this test can be used) for the duration of the COVID-19 declaration under Section 564(b)(1) of the Act, 21 U.S.C. section 360bbb-3(b)(1), unless the authorization is terminated or revoked.     Resp Syncytial Virus by PCR NEGATIVE NEGATIVE Final    Comment: (NOTE) Fact Sheet for Patients: bloggercourse.com  Fact Sheet for Healthcare  Providers: seriousbroker.it  This test is not yet approved or cleared by the [place]  FDA and has been authorized for detection and/or diagnosis of SARS-CoV-2 by FDA under an Emergency Use Authorization (EUA). This EUA will remain in effect (meaning this test can be used) for the duration of the COVID-19 declaration under Section 564(b)(1) of the Act, 21 U.S.C. section 360bbb-3(b)(1), unless the authorization is terminated or revoked.  Performed at [facility], [address] [place], [place] [address]   Blood Culture ID Panel (Reflexed)     Status: Abnormal   Collection Time: 07/31/24  3:12 PM  Result Value Ref Range Status   Enterococcus faecalis NOT DETECTED NOT DETECTED Final   Enterococcus Faecium NOT DETECTED NOT DETECTED Final   Listeria monocytogenes NOT DETECTED NOT DETECTED Final   Staphylococcus species NOT DETECTED NOT DETECTED Final   Staphylococcus aureus (BCID) NOT DETECTED NOT DETECTED Final   Staphylococcus epidermidis NOT DETECTED NOT DETECTED Final   Staphylococcus lugdunensis NOT DETECTED NOT DETECTED Final   Streptococcus species NOT DETECTED NOT DETECTED Final   Streptococcus agalactiae NOT DETECTED NOT DETECTED Final   Streptococcus pneumoniae NOT DETECTED NOT DETECTED Final   Streptococcus pyogenes NOT DETECTED NOT DETECTED Final   A.calcoaceticus-baumannii NOT DETECTED NOT DETECTED Final   Bacteroides fragilis NOT DETECTED NOT DETECTED Final   Enterobacterales DETECTED (A) NOT DETECTED Final    Comment: Enterobacterales represent a large order of gram negative bacteria, not a single organism. CRITICAL RESULT CALLED TO, READ BACK BY AND VERIFIED WITH: WALID NAZARI ON 08/01/24 AT 1033 QSD    Enterobacter cloacae complex NOT DETECTED NOT DETECTED Final   Escherichia coli DETECTED (A) NOT DETECTED Final    Comment: CRITICAL RESULT CALLED TO, READ BACK BY AND VERIFIED WITH: WALID NAZARI ON 08/01/24 AT 1033 QSD     Klebsiella aerogenes NOT DETECTED NOT DETECTED Final   Klebsiella oxytoca NOT DETECTED NOT DETECTED Final   Klebsiella pneumoniae NOT DETECTED NOT DETECTED Final   Proteus species NOT DETECTED NOT DETECTED Final   Salmonella species NOT DETECTED NOT DETECTED Final   Serratia marcescens NOT DETECTED NOT DETECTED Final   Haemophilus influenzae NOT DETECTED NOT DETECTED Final   Neisseria meningitidis NOT DETECTED NOT DETECTED Final   Pseudomonas aeruginosa NOT DETECTED NOT DETECTED Final   Stenotrophomonas maltophilia NOT DETECTED NOT  DETECTED Final   Candida albicans NOT DETECTED NOT DETECTED Final   Candida auris NOT DETECTED NOT DETECTED Final   Candida glabrata NOT DETECTED NOT DETECTED Final   Candida krusei NOT DETECTED NOT DETECTED Final   Candida parapsilosis NOT DETECTED NOT DETECTED Final   Candida tropicalis NOT DETECTED NOT DETECTED Final   Cryptococcus neoformans/gattii NOT DETECTED NOT DETECTED Final   CTX-M ESBL DETECTED (A) NOT DETECTED Final    Comment: CRITICAL RESULT CALLED TO, READ BACK BY AND VERIFIED WITH: WALID NAZARI ON 08/01/24 AT 1033 QSD (NOTE) Extended spectrum beta-lactamase detected. Recommend a carbapenem as initial therapy.      Carbapenem resistance IMP NOT DETECTED NOT DETECTED Final   Carbapenem resistance KPC NOT DETECTED NOT DETECTED Final   Carbapenem resistance NDM NOT DETECTED NOT DETECTED Final   Carbapenem resist OXA 48 LIKE NOT DETECTED NOT DETECTED Final   Carbapenem resistance VIM NOT DETECTED NOT DETECTED Final    Comment: Performed at [facility], [address] [place], [place] [address]  Urine Culture (for pregnant, neutropenic or urologic patients or patients with an indwelling urinary catheter)     Status: Abnormal   Collection Time: 07/31/24  5:35 PM   Specimen: Urine, Random  Result Value Ref Range Status   Specimen Description   Final    URINE, RANDOM Performed at [facility], [address]  [place], [place] [address]    Special Requests   Final    NONE Performed at [facility], [address] [place], [place] [address]    Culture (A)  Final    >=100,000 COLONIES/mL ESCHERICHIA COLI Confirmed Extended Spectrum Beta-Lactamase Producer (ESBL).  In bloodstream infections from ESBL organisms, carbapenems are preferred over piperacillin /tazobactam. They are shown to have a lower risk of mortality.    Report Status 08/03/2024 FINAL  Final   Organism ID, Bacteria ESCHERICHIA COLI (A)  Final      Susceptibility   Escherichia coli - MIC*    AMPICILLIN  >=32 RESISTANT Resistant     CEFAZOLIN  (URINE) Value in next row Resistant      >=32 RESISTANTThis is a modified FDA-approved test that has been validated and its performance characteristics determined by the reporting laboratory.  This laboratory is certified under the Clinical Laboratory Improvement Amendments CLIA as qualified to perform high complexity clinical laboratory testing.    CEFEPIME  Value in next row Resistant      >=32 RESISTANTThis is a modified FDA-approved test that has been validated and its performance characteristics determined by the reporting laboratory.  This laboratory is certified under the Clinical Laboratory Improvement Amendments CLIA as qualified to perform high complexity clinical laboratory testing.    ERTAPENEM  Value in next row Sensitive      >=32 RESISTANTThis is a modified FDA-approved test that has been validated and its performance characteristics determined by the reporting laboratory.  This laboratory is certified under the Clinical Laboratory Improvement Amendments CLIA as qualified to perform high complexity clinical laboratory testing.    CEFTRIAXONE  Value in next row Resistant      >=32 RESISTANTThis is a modified FDA-approved test that has been validated and its performance characteristics determined by the reporting laboratory.  This laboratory is certified under the Clinical Laboratory  Improvement Amendments CLIA as qualified to perform high complexity clinical laboratory testing.    CIPROFLOXACIN Value in next row Resistant      >=32 RESISTANTThis is a modified FDA-approved test that has been validated and its performance characteristics determined  by the reporting laboratory.  This laboratory is certified under the Clinical Laboratory Improvement Amendments CLIA as qualified to perform high complexity clinical laboratory testing.    GENTAMICIN  Value in next row Sensitive      >=32 RESISTANTThis is a modified FDA-approved test that has been validated and its performance characteristics determined by the reporting laboratory.  This laboratory is certified under the Clinical Laboratory Improvement Amendments CLIA as qualified to perform high complexity clinical laboratory testing.    NITROFURANTOIN Value in next row Sensitive      >=32 RESISTANTThis is a modified FDA-approved test that has been validated and its performance characteristics determined by the reporting laboratory.  This laboratory is certified under the Clinical Laboratory Improvement Amendments CLIA as qualified to perform high complexity clinical laboratory testing.    TRIMETH /SULFA  Value in next row Resistant      >=32 RESISTANTThis is a modified FDA-approved test that has been validated and its performance characteristics determined by the reporting laboratory.  This laboratory is certified under the Clinical Laboratory Improvement Amendments CLIA as qualified to perform high complexity clinical laboratory testing.    AMPICILLIN /SULBACTAM Value in next row Resistant      >=32 RESISTANTThis is a modified FDA-approved test that has been validated and its performance characteristics determined by the reporting laboratory.  This laboratory is certified under the Clinical Laboratory Improvement Amendments CLIA as qualified to perform high complexity clinical laboratory testing.    PIP/TAZO Value in next row Sensitive       16 SENSITIVEThis is a modified FDA-approved test that has been validated and its performance characteristics determined by the reporting laboratory.  This laboratory is certified under the Clinical Laboratory Improvement Amendments CLIA as qualified to perform high complexity clinical laboratory testing.    MEROPENEM  Value in next row Sensitive      16 SENSITIVEThis is a modified FDA-approved test that has been validated and its performance characteristics determined by the reporting laboratory.  This laboratory is certified under the Clinical Laboratory Improvement Amendments CLIA as qualified to perform high complexity clinical laboratory testing.    * >=100,000 COLONIES/mL ESCHERICHIA COLI         Radiology Studies: No results found.       Scheduled Meds:  vitamin C   1,000 mg Oral Daily   cyanocobalamin   500 mcg Oral Daily   enoxaparin   30 mg Subcutaneous Q12H   fluticasone  furoate-vilanterol  1 puff Inhalation Daily   furosemide   20 mg Oral Daily   iron  polysaccharides  150 mg Oral Daily   liver oil-zinc  oxide   Topical BID   multivitamin with minerals  1 tablet Oral Daily   pantoprazole   40 mg Oral BID   rosuvastatin   10 mg Oral Daily   trimethoprim   100 mg Oral QHS   Continuous Infusions:  meropenem  (MERREM ) IV 1 g (08/03/24 0859)     LOS: 3 days     Calvin KATHEE Robson, MD [facility]   If 7PM-7AM, please contact night-coverage  08/03/2024, 11:20 AM    "

## 2024-08-03 NOTE — Plan of Care (Signed)

## 2024-08-04 DIAGNOSIS — N179 Acute kidney failure, unspecified: Secondary | ICD-10-CM | POA: Diagnosis not present

## 2024-08-04 DIAGNOSIS — J449 Chronic obstructive pulmonary disease, unspecified: Secondary | ICD-10-CM | POA: Diagnosis not present

## 2024-08-04 DIAGNOSIS — Z1612 Extended spectrum beta lactamase (ESBL) resistance: Secondary | ICD-10-CM | POA: Diagnosis not present

## 2024-08-04 DIAGNOSIS — N390 Urinary tract infection, site not specified: Secondary | ICD-10-CM | POA: Diagnosis not present

## 2024-08-04 DIAGNOSIS — B9620 Unspecified Escherichia coli [E. coli] as the cause of diseases classified elsewhere: Secondary | ICD-10-CM | POA: Diagnosis not present

## 2024-08-04 DIAGNOSIS — R7881 Bacteremia: Secondary | ICD-10-CM | POA: Diagnosis not present

## 2024-08-04 DIAGNOSIS — T83011A Breakdown (mechanical) of indwelling urethral catheter, initial encounter: Secondary | ICD-10-CM | POA: Diagnosis not present

## 2024-08-04 NOTE — Progress Notes (Signed)
 PT Cancellation Note  Patient Details Name: Courtney Mack MRN: [id number] DOB: October 04, 1955   Cancelled Treatment:     PT attempt. Pt currently eating breakfast requesting author return at 11am.  I feel a lot better today than yesterday. I want to be up for lunch. Acute PT will return at 11 as requested.    [name] 08/04/2024, 8:49 AM

## 2024-08-04 NOTE — Progress Notes (Signed)
 [facility]  -  at [facility]   PATIENT NAME: Courtney Mack    MR#:  [id number]  DATE OF BIRTH:  05-05-1956  SUBJECTIVE:  no family at bedside. Patient work with PT and sitting in the recliner. Gets fatigued easily. Sats remains stable on room air. No fever. Tolerating PO diet.    VITALS:  Blood pressure (!) 105/45, pulse 86, temperature 99.2 F (37.3 C), resp. rate 16, height 4' 11 (1.499 m), weight 98.8 kg, SpO2 94%.  PHYSICAL EXAMINATION:   GENERAL:  69 y.o.-year-old patient with no acute distress.  LUNGS distant breath sounds bilaterally, no wheezing CARDIOVASCULAR: S1, S2 normal. No murmur   ABDOMEN: Soft, nontender, nondistended. Left nephrostomy tube EXTREMITIES:+ edema b/l.    NEUROLOGIC: nonfocal  patient is alert and awake, weak SKIN: per RN  LABORATORY PANEL:  CBC Recent Labs  Lab 08/03/24 1218  WBC 3.6*  HGB 7.4*  HCT 25.1*  PLT 221    Chemistries  Recent Labs  Lab 07/31/24 1512 08/01/24 0508 08/03/24 1218  NA 137   < > 136  K 5.2*   < > 4.1  CL 104   < > 106  CO2 21*   < > 23  GLUCOSE 156*   < > 119*  BUN 20   < > 23  CREATININE 1.39*   < > 1.02*  CALCIUM  9.2   < > 8.6*  AST 43*  --   --   ALT 13  --   --   ALKPHOS 63  --   --   BILITOT 1.2  --   --    < > = values in this interval not displayed.   Cardiac Enzymes No results for input(s): TROPONINI in the last 168 hours. RADIOLOGY:  IR NEPHROSTOMY EXCHANGE LEFT Result Date: 08/03/2024 INDICATION: Routine left nephrostomy tube exchange EXAM: Nephrostogram with catheter exchange COMPARISON:  None Available. CONTRAST:  10 mL Omnipaque  300-administered into the collecting system(s) FLUOROSCOPY: Radiation Exposure Index (as provided by the fluoroscopic device): 16 mGy Kerma COMPLICATIONS: None immediate. PROCEDURE: Informed written consent was obtained from the patient after a thorough discussion of the procedural risks, benefits and alternatives. All questions were  addressed. Maximal Sterile Barrier Technique was utilized including caps, mask, sterile gowns, sterile gloves, sterile drape, hand hygiene and skin antiseptic. A timeout was performed prior to the initiation of the procedure. In a prone position, dilute contrast was injected into the indwelling left-sided nephrostomy tube. Contrast can be seen flowing freely away from the catheter and its location is within the left renal pelvis. The catheter and retention sutures were cut. A short Amplatz wire was advanced into the catheter under fluoroscopic guidance. The catheter was un coiled and removed over the guidewire. A new 12 French skater was advanced over the guidewire and looped in the renal pelvis. Stiffener and guidewire were then retrieved. At the patient's request no retention suture applied. Sterile dressing applied. Catheter was connected to gravity drainage. IMPRESSION: Satisfactory exchange of a left-sided 70 French skater percutaneous nephrostomy tube. Patient will return in approximately 8 weeks for subsequent exchange. Electronically Signed   By: Cordella Banner   On: 08/03/2024 15:35   DG Chest Port 1 View Result Date: 08/03/2024 CLINICAL DATA:  Shortness of breath and weakness. EXAM: PORTABLE CHEST 1 VIEW COMPARISON:  07/31/2024 FINDINGS: The heart size and mediastinal contours are within normal limits. Stable low lung volumes. Stable scarring at the left lung base. There is no evidence of pulmonary  edema, consolidation, pneumothorax or pleural fluid. The visualized skeletal structures are unremarkable. IMPRESSION: Low lung volumes and left basilar scarring. No acute findings. Electronically Signed   By: Marcey Moan M.D.   On: 08/03/2024 15:15    Assessment and Plan  69 y.o. female with medical history significant of left-sided hydronephrosis status post nephrostomy tube placement, recurrent UTI and sepsis due to UTI, ESBL, hypertension, COPD, history of extensive bilateral PE and DVT (s/p of  IVC filter, on lovenox ), dCHF, who presents with weakness, dysuria and shortness of breath.   Complicated UTI (urinary tract infection) ESBL E. coli bacteremia Chronic left Nephrostomy tube since June 2023--s/p exchange by IR on 08/03/24 H/o Endometrial CA Blood and urine cultures positive for ESBL E. coli.  CT abdomen pelvis with appropriately placed nephrostomy but mild hydronephrosis and hydroureter.  No evidence of obstructive uropathy. Continue IV meropenem  ID consult.  Case discussed with Dr. Searcy Repeat [facility] pending   AKI (acute kidney injury):  Baseline creatinine 0.65 on 05/20/2024.  Creatinine elevated on arrival Patient has been on IVF for 48 hours.  Discontinue IV fluids.  Recheck creatinine to ensure resolution of AKI.  Lasix  resumed at home dose 20 mg daily. Creat down to 1.0 (1.39)   Hyperkalemia, resolved  Potassium 5.2.  On arrival Lasix  Responded to IV fluids and Lokelma    Chronic diastolic CHF (congestive heart failure) (HCC):  2D echo on 11/21/2022 showed EF of 60-65% with grade 1 diastolic dysfunction.  Patient does not have leg edema.  CHF seems to be compensated. Lasix  resumed 1/27   COPD (chronic obstructive pulmonary disease) [facility]):  Patient reports shortness of breath earlier, which has improved in ED.  No oxygen desaturation.  PCR negative for COVID, flu and RSV.  Chest x-ray negative.  BNP 510, no leg edema, does not seem to have COPD exacerbation. Bronchodilators as needed Mucinex    Hypertension:  Blood pressure is soft Resume Lasix    HLD: Continue Crestor    Recurrent deep vein thrombosis (DVT) (HCC) and History of pulmonary embolism/DVT s/p IVC filter: Home weight-based Lovenox    Iron  deficiency anemia:  Hemoglobin stable 9.3 (8.5 on 05/20/2024) P.o. iron    Obesity, Class III, BMI 40-49.9 (morbid obesity) (HCC):  Patient has Obesity Class III, with body weight  97.1 Kg and BMI 43.24 kg/m2.  Complicating factor in overall care and  prognosis  PT/OT rec Rehab TOC for d/c planning     DVT prophylaxis: Lovenox  Code Status: Full Family Communication: None Consults :ID Level of care: Telemetry Status is: Inpatient Remains inpatient appropriate because: ESBL ecoli sepsis--on IV abxs.     TOTAL TIME TAKING CARE OF THIS PATIENT: 30 minutes.  >50% time spent on counselling and coordination of care  Note: This dictation was prepared with Dragon dictation along with smaller phrase technology. Any transcriptional errors that result from this process are unintentional.  Leita Blanch M.D    [facility]   CC: Primary care physician; Center, [facility]

## 2024-08-04 NOTE — Progress Notes (Signed)
 Physical Therapy Treatment Patient Details Name: Courtney Mack MRN: [id number] DOB: [birth year] Today's Date: 08/04/2024   History of Present Illness 69 y.o. female with medical history significant of left-sided hydronephrosis status post nephrostomy tube placement, recurrent UTI and sepsis due to UTI, ESBL, hypertension, COPD, history of extensive bilateral PE and DVT (s/p of IVC filter, on lovenox ), dCHF, who presents with weakness, dysuria and shortness of breath.    PT Comments  Pt was long sitting in bed upon arrival. She is agreeable to session on 2nd attempt today. Pt is pleasant but continues to require extensive assistance to exit bed, stand to RW, and take a few steps from EOB to recliner. Severe SOB noted. Poor activity tolerance noted. Discussed with pt's daughter post session concerns and care needs post acute. Per daughter, pt is not at her baseline and will benefit from continued skilled PT to maximize her independent and safety with ADLs. DC recs updated to reflect pt needs and more aggressive PT needs than HH can offer.    If plan is discharge home, recommend the following: A little help with walking and/or transfers;A little help with bathing/dressing/bathroom;Help with stairs or ramp for entrance;Assistance with cooking/housework;Assist for transportation     Equipment Recommendations  Rolling walker (2 wheels)       Precautions / Restrictions Precautions Precautions: Fall Recall of Precautions/Restrictions: Intact Restrictions Weight Bearing Restrictions Per Provider Order: No     Mobility  Bed Mobility Overal bed mobility: Needs Assistance Bed Mobility: Supine to Sit  Supine to sit: Mod assist, Used rails, HOB elevated  General bed mobility comments: increased time + heavy use of bed rail and assist to safely achieve EOB sitting    Transfers Overall transfer level: Needs assistance Equipment used: Rolling walker (2 wheels) Transfers: Sit to/from Stand Sit  to Stand: Mod assist, +2 safety/equipment, From elevated surface  General transfer comment: Pt stood form slightly elevated bed height with mod assist fo one and 2nd person SBA for safety    Ambulation/Gait Ambulation/Gait assistance: Min assist Gait Distance (Feet): 5 Feet Assistive device: Rolling walker (2 wheels) Gait Pattern/deviations: Step-to pattern Gait velocity: decreased  General Gait Details: Pt did take a few poor quality step to steps form EOB to recliner. Flexed posture observed with severe SOB noted. Pt gets anxious in standing due to her breathing difficulty.    Balance Overall balance assessment: Needs assistance Sitting-balance support: Feet supported Sitting balance-Leahy Scale: Good     Standing balance support: During functional activity, Reliant on assistive device for balance Standing balance-Leahy Scale: Fair      Hotel Manager: No apparent difficulties  Cognition Arousal: Alert Behavior During Therapy: WFL for tasks assessed/performed, Anxious   PT - Cognitive impairments: No apparent impairments    PT - Cognition Comments: Pt is A and O x 4. Does have a little anxiety noted when in standing with SOB after very minimal activity Following commands: Intact      Cueing Cueing Techniques: Verbal cues, Tactile cues         Pertinent Vitals/Pain Pain Assessment Pain Assessment: No/denies pain     PT Goals (current goals can now be found in the care plan section) Acute Rehab PT Goals Patient Stated Goal: get well so I can go home with my daughter and 5 grandkids Progress towards PT goals: Progressing toward goals    Frequency    Min 2X/week       AM-PAC PT 6 Clicks Mobility   Outcome Measure  Help needed turning from your back to your side while in a flat bed without using bedrails?: A Little Help needed moving from lying on your back to sitting on the side of a flat bed without using bedrails?: A  Little Help needed moving to and from a bed to a chair (including a wheelchair)?: A Little Help needed standing up from a chair using your arms (e.g., wheelchair or bedside chair)?: A Little Help needed to walk in hospital room?: A Little Help needed climbing 3-5 steps with a railing? : A Lot 6 Click Score: 17    End of Session   Activity Tolerance: Patient limited by fatigue Patient left: in chair;with call bell/phone within reach;with chair alarm set Nurse Communication: Mobility status PT Visit Diagnosis: Other abnormalities of gait and mobility (R26.89);Other symptoms and signs involving the nervous system (R29.898);Difficulty in walking, not elsewhere classified (R26.2)     Time: 8944-8884 PT Time Calculation (min) (ACUTE ONLY): 20 min  Charges:    $Therapeutic Activity: 8-22 mins PT General Charges $$ ACUTE PT VISIT: 1 Visit                    Rankin [place] PTA 08/04/24, 12:34 PM

## 2024-08-04 NOTE — Progress Notes (Signed)
" °   08/04/24 1800  External Urinary Catheter  Placement Date/Time: 07/31/24 2025   External Urinary Catheter Type: Female External Catheter with Suction  Output (mL) 300 mL  Output (mL) 400 mL  Unmeasured Output  Urine Occurrence 1    "

## 2024-08-04 NOTE — TOC Progression Note (Signed)
 Transition of Care [facility]) - Progression Note    Patient Details  Name: Courtney Mack MRN: [id number] Date of Birth: [birth year]  Transition of [facility]) CM/SW Contact  Corean ONEIDA Haddock, RN Phone Number: 08/04/2024, 3:12 PM  Clinical Narrative:      Therapy has changed recs to SNF Met with patient at bedside. She is in agreement to bed search Existing PASRR Fl2 sent for signature Bed search initiated Per MD ID to determine if patient will require IV antibiotics at dc                     Expected Discharge Plan and Services                                               Social Drivers of Health (SDOH) Interventions SDOH Screenings   Food Insecurity: No Food Insecurity (07/31/2024)  Recent Concern: Food Insecurity - Food Insecurity Present (05/14/2024)  Housing: Low Risk (07/31/2024)  Transportation Needs: No Transportation Needs (07/31/2024)  Utilities: Not At Risk (07/31/2024)  Depression (PHQ2-9): Low Risk (04/11/2022)  Social Connections: Socially Isolated (07/31/2024)  Tobacco Use: Low Risk (07/31/2024)    Readmission Risk Interventions    08/02/2024   12:36 PM 04/07/2024    3:24 PM 08/07/2023   10:26 AM  Readmission Risk Prevention Plan  Transportation Screening Complete Complete   PCP or Specialist Appt within 3-5 Days Complete Complete   HRI or Home Care Consult Complete    Social Work Consult for Recovery Care Planning/Counseling  Complete   Palliative Care Screening Not Applicable Not Applicable   Medication Review Oceanographer) Complete Complete Complete  PCP or Specialist appointment within 3-5 days of discharge   Complete  SW Recovery Care/Counseling Consult   Complete  Palliative Care Screening   Not Applicable  Skilled Nursing Facility   Not Applicable

## 2024-08-04 NOTE — Progress Notes (Signed)
 "  Date of Admission:  07/31/2024      ID: Courtney Mack is a 69 y.o. female  Principal Problem:   Complicated UTI (urinary tract infection) Active Problems:   Recurrent deep vein thrombosis (DVT) (HCC)   HLD (hyperlipidemia)   Chronic diastolic CHF (congestive heart failure) (HCC)   AKI (acute kidney injury)   History of pulmonary embolism/DVT s/p IVC filter   COPD (chronic obstructive pulmonary disease) (HCC)   Iron  deficiency anemia   Hypertension   Obesity, Class III, BMI 40-49.9 (morbid obesity) (HCC)   Hyperkalemia   E coli bacteremia   Infection due to ESBL-producing Escherichia coli  Courtney Mack is a 69 y.o. with a history of endometrial carcinoma, radiation cystitis, left nephrostomy tube for hydronephrosis which was placed in June 2023 with frequent exchanges , last exchange Nov 2025, ESBL ecoli  UTI, Recurrent UTI, Anemia secondary to GAVE, DVT, PE, IVC filter in the past  presents from home on 07/31/24  with severe dysuria, left sided flank pain and nephrostomy tube not draining much urine   Subjective: Pt is doing much better No fever No flank pain  Medications:   vitamin C   1,000 mg Oral Daily   cyanocobalamin   500 mcg Oral Daily   enoxaparin   30 mg Subcutaneous Q12H   fluticasone  furoate-vilanterol  1 puff Inhalation Daily   furosemide   20 mg Oral Daily   iron  polysaccharides  150 mg Oral Daily   liver oil-zinc  oxide   Topical BID   multivitamin with minerals  1 tablet Oral Daily   pantoprazole   40 mg Oral BID   rosuvastatin   10 mg Oral Daily    Objective: Vital signs in last 24 hours: Patient Vitals for the past 24 hrs:  BP Temp Temp src Pulse Resp SpO2 Weight  08/04/24 1621 (!) 96/51 98.4 F (36.9 C) -- 83 16 99 % --  08/04/24 0743 (!) 105/45 99.2 F (37.3 C) -- 86 16 94 % --  08/04/24 0411 -- -- -- -- -- -- 98.8 kg  08/04/24 0408 (!) 128/59 98.4 F (36.9 C) Oral 89 20 94 % --  08/03/24 2133 (!) 109/44 98.9 F (37.2 C) Oral 81 16 97 % --        PHYSICAL EXAM:  General: Alert, cooperative, no distress, appears stated age.  Back: No CVA tenderness. Left nephrostomy- clear urine Lungs: Clear to auscultation bilaterally. No Wheezing or Rhonchi. No rales. Heart: Regular rate and rhythm, no murmur, rub or gallop. Abdomen: Soft, non-tender,not distended. Bowel sounds normal. No masses Extremities: atraumatic, no cyanosis. No edema. No clubbing Skin: No rashes or lesions. Or bruising Lymph: Cervical, supraclavicular normal. Neurologic: Grossly non-focal  Lab Results    Latest Ref Rng & Units 08/03/2024   12:18 PM 08/01/2024    5:08 AM 07/31/2024    3:12 PM  CBC  WBC 4.0 - 10.5 K/uL 3.6  11.0  10.8   Hemoglobin 12.0 - 15.0 g/dL 7.4  8.7  9.3   Hematocrit 36.0 - 46.0 % 25.1  29.0  31.3   Platelets 150 - 400 K/uL 221  247  265        Latest Ref Rng & Units 08/03/2024   12:18 PM 08/01/2024    5:08 AM 07/31/2024    3:12 PM  CMP  Glucose 70 - 99 mg/dL 880  871  843   BUN 8 - 23 mg/dL 23  19  20    Creatinine 0.44 - 1.00 mg/dL 8.97  1.34  1.39   Sodium 135 - 145 mmol/L 136  137  137   Potassium 3.5 - 5.1 mmol/L 4.1  4.5  5.2   Chloride 98 - 111 mmol/L 106  105  104   CO2 22 - 32 mmol/L 23  24  21    Calcium  8.9 - 10.3 mg/dL 8.6  8.9  9.2   Total Protein 6.5 - 8.1 g/dL   6.7   Total Bilirubin 0.0 - 1.2 mg/dL   1.2   Alkaline Phos 38 - 126 U/L   63   AST 15 - 41 U/L   43   ALT 0 - 44 U/L   13       Microbiology: 07/31/24 BC ESBL Ecoli 08/03/24 BC repeated Studies/Results: IR NEPHROSTOMY EXCHANGE LEFT Result Date: 08/03/2024 INDICATION: Routine left nephrostomy tube exchange EXAM: Nephrostogram with catheter exchange COMPARISON:  None Available. CONTRAST:  10 mL Omnipaque  300-administered into the collecting system(s) FLUOROSCOPY: Radiation Exposure Index (as provided by the fluoroscopic device): 16 mGy Kerma COMPLICATIONS: None immediate. PROCEDURE: Informed written consent was obtained from the patient after a thorough  discussion of the procedural risks, benefits and alternatives. All questions were addressed. Maximal Sterile Barrier Technique was utilized including caps, mask, sterile gowns, sterile gloves, sterile drape, hand hygiene and skin antiseptic. A timeout was performed prior to the initiation of the procedure. In a prone position, dilute contrast was injected into the indwelling left-sided nephrostomy tube. Contrast can be seen flowing freely away from the catheter and its location is within the left renal pelvis. The catheter and retention sutures were cut. A short Amplatz wire was advanced into the catheter under fluoroscopic guidance. The catheter was un coiled and removed over the guidewire. A new 12 French skater was advanced over the guidewire and looped in the renal pelvis. Stiffener and guidewire were then retrieved. At the patient's request no retention suture applied. Sterile dressing applied. Catheter was connected to gravity drainage. IMPRESSION: Satisfactory exchange of a left-sided 66 French skater percutaneous nephrostomy tube. Patient will return in approximately 8 weeks for subsequent exchange. Electronically Signed   By: Cordella Banner   On: 08/03/2024 15:35   DG Chest Port 1 View Result Date: 08/03/2024 CLINICAL DATA:  Shortness of breath and weakness. EXAM: PORTABLE CHEST 1 VIEW COMPARISON:  07/31/2024 FINDINGS: The heart size and mediastinal contours are within normal limits. Stable low lung volumes. Stable scarring at the left lung base. There is no evidence of pulmonary edema, consolidation, pneumothorax or pleural fluid. The visualized skeletal structures are unremarkable. IMPRESSION: Low lung volumes and left basilar scarring. No acute findings. Electronically Signed   By: Marcey Moan M.D.   On: 08/03/2024 15:15     Assessment/Plan: ESBl E.coli bacteremia with complicated UTI Due to obstructive uropathy secondary to malfunctioning left nephrostomy tube which has been exchanged Pt  is on meropenem  Repeat blood culture sent Will need for 10 days minimum On discharge it would be ertapenem    Recurrent UTI due to ESBL ecoli due   AKI   H/o endometrial Ca s/p radiation with h/o radiation cystitis   Anemia    H/o GAVE   H/o DVT   Explained to patient that she has to keep a close eye on the nephrostomy tube and at the first instance of malfunction need to contact radiology, in order to prevent hydronephrosis and infection    "

## 2024-08-04 NOTE — [place] FL2 (Signed)
 " [place]  MEDICAID FL2 LEVEL OF CARE FORM     IDENTIFICATION  Patient Name: Courtney Mack Birthdate: Feb 11, 1956 Sex: female Admission Date (Current Location): 07/31/2024  [facility] and [place] Number:  Chiropodist and Address:         Provider Number: [id number]  Attending Physician Name and Address:  Tobie Calix, MD  Relative Name and Phone Number:       Current Level of Care: Hospital Recommended Level of Care: Skilled Nursing Facility Prior Approval Number:    Date Approved/Denied:   PASRR Number: [id number]  Discharge Plan: SNF    Current Diagnoses: Patient Active Problem List   Diagnosis Date Noted   E coli bacteremia 08/03/2024   Infection due to ESBL-producing Escherichia coli 08/03/2024   Obesity, Class III, BMI 40-49.9 (morbid obesity) (HCC) 07/31/2024   Hyperkalemia 07/31/2024   History of infection due to ESBL Escherichia coli 05/15/2024   UTI due to extended-spectrum beta lactamase (ESBL) producing Escherichia coli 04/06/2024   COPD exacerbation (HCC) 08/27/2023   Other pulmonary embolism without acute cor pulmonale (HCC) 08/07/2023   Chronic deep vein thrombosis (DVT) of proximal vein of right lower extremity (HCC) 08/05/2023   Acute pulmonary embolism (HCC) 08/04/2023   Dysuria 06/22/2023   SOB (shortness of breath) 06/18/2023   HTN (hypertension) 06/18/2023   Complicated UTI (urinary tract infection) 05/26/2023   Diarrhea 05/26/2023   Blocked nephrostomy tube with hydronephrosis and hydroureter (HCC) 04/05/2023   Hypertension    Pressure injury of skin 02/11/2023   Severe sepsis with acute organ dysfunction (HCC) 02/10/2023   Obesity (BMI 30-39.9) 11/22/2022   Sepsis due to gram-negative UTI (HCC) 11/21/2022   Acute pyelonephritis 11/21/2022   Dyspnea 11/21/2022   Chest pain 11/21/2022   Hypokalemia 10/25/2022   Chronic obstructive pulmonary disease (COPD) (HCC) 10/24/2022   Iron  deficiency anemia 10/24/2022   Symptomatic  anemia 08/08/2022   COPD (chronic obstructive pulmonary disease) (HCC) 08/07/2022   Sepsis due to pneumonia (HCC) 06/12/2022   Multifocal pneumonia 06/12/2022   GERD without esophagitis 06/12/2022   Generalized weakness 06/12/2022   Nephrostomy status (HCC) 06/12/2022   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) 04/16/2022   GI bleed 03/14/2022   Acute on chronic anemia 02/24/2022   Hypotension 02/24/2022   Acute cystitis with hematuria    Lymphedema 02/08/2022   Hematuria 12/24/2021   History of pulmonary embolism/DVT s/p IVC filter 12/24/2021   Chronic radiation cystitis 11/17/2021   Hydronephrosis of left kidney    Gastric antral vascular ectasia    Complicated urinary tract infection 09/13/2021   AKI (acute kidney injury) 09/13/2021   Melena 07/18/2021   Acute on chronic blood loss anemia 07/18/2021   HLD (hyperlipidemia) 06/20/2021   Chronic diastolic CHF (congestive heart failure) (HCC) 06/20/2021   Risk factors for obstructive sleep apnea 11/10/2020   IDA (iron  deficiency anemia) 09/21/2020   Endometrial cancer (HCC) 09/13/2020   Recurrent deep vein thrombosis (DVT) (HCC) 09/06/2020   Class 3 obesity (HCC) 08/23/2020    Orientation RESPIRATION BLADDER Height & Weight     Self, Time, Situation, Place  Normal Incontinent Weight: 98.8 kg Height:  4' 11 (149.9 cm)  BEHAVIORAL SYMPTOMS/MOOD NEUROLOGICAL BOWEL NUTRITION STATUS      Continent Diet (Heart Healthy)  AMBULATORY STATUS COMMUNICATION OF NEEDS Skin   Extensive Assist Verbally Other (Comment) (nephrostomy tube)  Personal Care Assistance Level of Assistance              Functional Limitations Info             SPECIAL CARE FACTORS FREQUENCY  PT (By licensed PT), OT (By licensed OT)                    Contractures Contractures Info: Not present    Additional Factors Info  Code Status, Allergies, Isolation Precautions Code Status Info:  Full Allergies Info: NKDA     Isolation Precautions Info: Contact ESBL     Current Medications (08/04/2024):  This is the current hospital active medication list Current Facility-Administered Medications  Medication Dose Route Frequency Provider Last Rate Last Admin   acetaminophen  (TYLENOL ) tablet 650 mg  650 mg Oral Q6H PRN Niu, Xilin, MD   650 mg at 08/02/24 0935   ascorbic acid  (VITAMIN C ) tablet 1,000 mg  1,000 mg Oral Daily Niu, Xilin, MD   1,000 mg at 08/04/24 9140   cyanocobalamin  (VITAMIN B12) tablet 500 mcg  500 mcg Oral Daily Niu, Xilin, MD   500 mcg at 08/04/24 9140   dextromethorphan -guaiFENesin  (MUCINEX  DM) 30-600 MG per 12 hr tablet 1 tablet  1 tablet Oral BID PRN Niu, Xilin, MD   1 tablet at 08/02/24 9062   enoxaparin  (LOVENOX ) injection 30 mg  30 mg Subcutaneous Q12H Niu, Xilin, MD   30 mg at 08/04/24 9140   fluticasone  furoate-vilanterol (BREO ELLIPTA ) 100-25 MCG/ACT 1 puff  1 puff Inhalation Daily Niu, Xilin, MD   1 puff at 08/04/24 0901   furosemide  (LASIX ) tablet 20 mg  20 mg Oral Daily Sreenath, Sudheer B, MD   20 mg at 08/04/24 0859   ipratropium-albuterol  (DUONEB) 0.5-2.5 (3) MG/3ML nebulizer solution 3 mL  3 mL Nebulization Q4H PRN Jhonny Sahara B, MD       iron  polysaccharides (NIFEREX) capsule 150 mg  150 mg Oral Daily Niu, Xilin, MD   150 mg at 08/04/24 0900   liver oil-zinc  oxide (DESITIN) 40 % ointment   Topical BID Sreenath, Sudheer B, MD   Given at 08/04/24 9095   loperamide  (IMODIUM ) capsule 2 mg  2 mg Oral PRN Niu, Xilin, MD       meropenem  (MERREM ) 1 g in sodium chloride  0.9 % 100 mL IVPB  1 g Intravenous Q8H Sreenath, Sudheer B, MD 200 mL/hr at 08/04/24 0625 1 g at 08/04/24 9374   morphine  (PF) 2 MG/ML injection 2 mg  2 mg Intravenous Q3H PRN Sreenath, Sudheer B, MD   2 mg at 08/03/24 2129   multivitamin with minerals tablet 1 tablet  1 tablet Oral Daily Niu, Xilin, MD   1 tablet at 08/04/24 9140   ondansetron  (ZOFRAN ) injection 4 mg  4 mg Intravenous  Q8H PRN Niu, Xilin, MD       pantoprazole  (PROTONIX ) EC tablet 40 mg  40 mg Oral BID Niu, Xilin, MD   40 mg at 08/04/24 9140   rosuvastatin  (CRESTOR ) tablet 10 mg  10 mg Oral Daily Niu, Xilin, MD   10 mg at 08/04/24 9140     Discharge Medications: Please see discharge summary for a list of discharge medications.  Relevant Imaging Results:  Relevant Lab Results:   Additional Information SS#: [id number]  Corean ONEIDA Haddock, RN     "

## 2024-08-04 NOTE — Plan of Care (Signed)
   Problem: Education: Goal: Knowledge of General Education information will improve Description: Including pain rating scale, medication(s)/side effects and non-pharmacologic comfort measures Outcome: Progressing   Problem: Clinical Measurements: Goal: Ability to maintain clinical measurements within normal limits will improve Outcome: Progressing Goal: Will remain free from infection Outcome: Progressing Goal: Diagnostic test results will improve Outcome: Progressing Goal: Respiratory complications will improve Outcome: Progressing Goal: Cardiovascular complication will be avoided Outcome: Progressing   Problem: Nutrition: Goal: Adequate nutrition will be maintained Outcome: Progressing   Problem: Coping: Goal: Level of anxiety will decrease Outcome: Progressing   Problem: Elimination: Goal: Will not experience complications related to bowel motility Outcome: Progressing Goal: Will not experience complications related to urinary retention Outcome: Progressing   Problem: Pain Managment: Goal: General experience of comfort will improve and/or be controlled Outcome: Progressing   Problem: Safety: Goal: Ability to remain free from injury will improve Outcome: Progressing   Problem: Skin Integrity: Goal: Risk for impaired skin integrity will decrease Outcome: Progressing

## 2024-08-04 NOTE — Evaluation (Signed)
 Occupational Therapy Evaluation Patient Details Name: Courtney Mack MRN: [id number] DOB: July 19, 1955 Today's Date: 08/04/2024   History of Present Illness   69 y.o. female with medical history significant of left-sided hydronephrosis status post nephrostomy tube placement, recurrent UTI and sepsis due to UTI, ESBL, hypertension, COPD, history of extensive bilateral PE and DVT (s/p of IVC filter, on lovenox ), dCHF, who presents with weakness, dysuria and shortness of breath.     Clinical Impressions Pt. presents with weakness, limited activity tolerance, and limited functional mobility which  hinder her ability to complete basic ADL and IADL functioning. Pt. Resides at home with her daughter, and 5 grandchildren. Pt. reports taking sponge baths at baseline 2/2 colostomy. Pt. was independently able to set-up items needed for morning care. Pt. Required assist from her daughter for LE ADLs. Pt.'s daughter provides assist for IADL tasks including meal preparation, and home management tasks. Pt. was able to set-up her own medication pillbox, and was independent with taking them at the appropriate time using an alarm. Pt. does not drive. Pt. reports having SOB during activity in standing.  Pt. SO2 93% 80 bpms. Pt. requires SBA for UE ADLs, and Mod-MaxA for LE ADLs. Pt. education was provided about basic  energy conservation for ADLs, as well as PLB breathing techniques. Pt. Will  benefit from OT services for ADL training, A/E training, UE there. Ex. and Pt./caregiver education about  Energy conservation during ADLs/IADL tasks.     If plan is discharge home, recommend the following:   A lot of help with bathing/dressing/bathroom;A lot of help with walking and/or transfers;Assistance with cooking/housework;Assist for transportation     Functional Status Assessment   Patient has had a recent decline in their functional status and demonstrates the ability to make significant improvements in function  in a reasonable and predictable amount of time.     Equipment Recommendations   BSC/3in1 (Currently has one at home, however the legs are having difficulty adjusting)     Recommendations for Other Services         Precautions/Restrictions   Precautions Precautions: Fall Recall of Precautions/Restrictions: Intact Restrictions Contact Precautions Weight Bearing Restrictions Per Provider Order: No     Mobility Bed Mobility Overal bed mobility: Needs Assistance Bed Mobility: Supine to Sit     Supine to sit: Mod assist, Used rails, HOB elevated          Transfers Overall transfer level: Needs assistance Equipment used: Rolling walker (2 wheels) Transfers: Sit to/from Stand Sit to Stand: Mod assist, +2 safety/equipment, From elevated surface           General transfer comment: Mobility per PT report      Balance                                           ADL either performed or assessed with clinical judgement   ADL Overall ADL's : Needs assistance/impaired                                       General ADL Comments: SBA UE ADLs, Mod-Max LE ADLs     Vision Baseline Vision/History: 1 Wears glasses;0 No visual deficits Patient Visual Report: No change from baseline       Perception  Praxis         Pertinent Vitals/Pain Pain Assessment Pain Assessment: No/denies pain     Extremity/Trunk Assessment Upper Extremity Assessment Upper Extremity Assessment: Generalized weakness           Communication Communication Communication: No apparent difficulties   Cognition Arousal: Alert Behavior During Therapy: WFL for tasks assessed/performed, Anxious                                 Following commands: Intact       Cueing  General Comments   Cueing Techniques: Verbal cues;Tactile cues      Exercises     Shoulder Instructions      Home Living Family/patient expects to be  discharged to:: Private residence Living Arrangements: Children Available Help at Discharge: Family;Available 24 hours/day Type of Home: Apartment Home Access: Stairs to enter   Entrance Stairs-Rails: None Home Layout: One level     Bathroom Shower/Tub: Tub/shower unit;Sponge bathes at baseline   Bathroom Toilet: Handicapped height Bathroom Accessibility: No   Home Equipment: Rollator (4 wheels);BSC/3in1;Shower seat;Wheelchair - manual;Cane - quad;Cane - single point;Adaptive equipment Adaptive Equipment: Reacher        Prior Functioning/Environment               Mobility Comments: amb in house with rollator, sleeps in recliner at home, grandchildren assist Pt. with LEs when climbing stairs. ADLs Comments: Mod I for ADLs, sponge bathing at baselin, Daughter assists Pt with LE dressing. Daughter prepares meals, and  home management tasks. Pt manages own meds including set-up of pillbox,a nd medication time using an alarm.    OT Problem List: Decreased strength;Decreased activity tolerance   OT Treatment/Interventions:        OT Goals(Current goals can be found in the care plan section)   Acute Rehab OT Goals Patient Stated Goal: To go to rehab prior to returning home OT Goal Formulation: With patient Time For Goal Achievement: 08/04/24 Potential to Achieve Goals: Good   OT Frequency:  Min 2X/week    Co-evaluation              AM-PAC OT 6 Clicks Daily Activity     Outcome Measure Help from another person eating meals?: None Help from another person taking care of personal grooming?: A Little Help from another person toileting, which includes using toliet, bedpan, or urinal?: A Lot Help from another person bathing (including washing, rinsing, drying)?: A Lot Help from another person to put on and taking off regular upper body clothing?: A Little Help from another person to put on and taking off regular lower body clothing?: A Lot 6 Click Score: 16    End of Session    Activity Tolerance: Patient tolerated treatment well Patient left: in bed;with call bell/phone within reach;with bed alarm set  OT Visit Diagnosis: Muscle weakness (generalized) (M62.81)                Time: 1420-1450 OT Time Calculation (min): 30 min Charges:  OT General Charges $OT Visit: 1 Visit OT Evaluation $OT Eval Moderate Complexity: 1 Mod OT Treatments $Self Care/Home Management : 8-22 mins Richardson Otter, MS, OTR/L  Richardson Otter 08/04/2024, 4:09 PM

## 2024-08-05 DIAGNOSIS — J449 Chronic obstructive pulmonary disease, unspecified: Secondary | ICD-10-CM | POA: Diagnosis not present

## 2024-08-05 DIAGNOSIS — Z86711 Personal history of pulmonary embolism: Secondary | ICD-10-CM

## 2024-08-05 DIAGNOSIS — E66813 Obesity, class 3: Secondary | ICD-10-CM

## 2024-08-05 DIAGNOSIS — E875 Hyperkalemia: Secondary | ICD-10-CM | POA: Diagnosis not present

## 2024-08-05 DIAGNOSIS — I82409 Acute embolism and thrombosis of unspecified deep veins of unspecified lower extremity: Secondary | ICD-10-CM | POA: Diagnosis not present

## 2024-08-05 DIAGNOSIS — A498 Other bacterial infections of unspecified site: Secondary | ICD-10-CM

## 2024-08-05 DIAGNOSIS — R7881 Bacteremia: Secondary | ICD-10-CM | POA: Diagnosis not present

## 2024-08-05 DIAGNOSIS — N179 Acute kidney failure, unspecified: Secondary | ICD-10-CM | POA: Diagnosis not present

## 2024-08-05 DIAGNOSIS — I10 Essential (primary) hypertension: Secondary | ICD-10-CM | POA: Diagnosis not present

## 2024-08-05 DIAGNOSIS — D500 Iron deficiency anemia secondary to blood loss (chronic): Secondary | ICD-10-CM | POA: Diagnosis not present

## 2024-08-05 DIAGNOSIS — I5032 Chronic diastolic (congestive) heart failure: Secondary | ICD-10-CM | POA: Diagnosis not present

## 2024-08-05 DIAGNOSIS — N390 Urinary tract infection, site not specified: Secondary | ICD-10-CM | POA: Diagnosis not present

## 2024-08-05 LAB — CULTURE, BLOOD (ROUTINE X 2): Culture: NO GROWTH

## 2024-08-05 MED ORDER — SODIUM CHLORIDE 0.9% FLUSH: 10.0000 mL | INTRAVENOUS | Status: DC | PRN

## 2024-08-05 MED ORDER — SODIUM CHLORIDE 0.9% FLUSH
10.0000 mL | Freq: Two times a day (BID) | INTRAVENOUS | Status: DC
  Administered 2024-08-05: 20 mL
  Administered 2024-08-06: 10 mL

## 2024-08-05 MED ORDER — SODIUM CHLORIDE 0.9 % IV SOLN
1.0000 g | INTRAVENOUS | Status: DC
  Administered 2024-08-06: 1 g via INTRAVENOUS
  Filled 2024-08-05: qty 1000

## 2024-08-05 NOTE — TOC Progression Note (Signed)
 Transition of Care [facility]) - Progression Note    Patient Details  Name: Courtney Mack MRN: [id number] Date of Birth: [birth year]  [facility] CM/SW Contact  Corean ONEIDA Haddock, RN Phone Number: 08/05/2024, 10:07 AM  Clinical Narrative:      Message sent to MD to determine when it is anticipated patient medically will be ready for DC and to confirm if patient will require IV antibiotics at dc                   Expected Discharge Plan and Services                                               Social Drivers of Health (SDOH) Interventions SDOH Screenings   Food Insecurity: No Food Insecurity (07/31/2024)  Recent Concern: Food Insecurity - Food Insecurity Present (05/14/2024)  Housing: Low Risk (07/31/2024)  Transportation Needs: No Transportation Needs (07/31/2024)  Utilities: Not At Risk (07/31/2024)  Depression (PHQ2-9): Low Risk (04/11/2022)  Social Connections: Socially Isolated (07/31/2024)  Tobacco Use: Low Risk (07/31/2024)    Readmission Risk Interventions    08/02/2024   12:36 PM 04/07/2024    3:24 PM 08/07/2023   10:26 AM  Readmission Risk Prevention Plan  Transportation Screening Complete Complete   PCP or Specialist Appt within 3-5 Days Complete Complete   HRI or Home Care Consult Complete    Social Work Consult for Recovery Care Planning/Counseling  Complete   Palliative Care Screening Not Applicable Not Applicable   Medication Review Oceanographer) Complete Complete Complete  PCP or Specialist appointment within 3-5 days of discharge   Complete  SW Recovery Care/Counseling Consult   Complete  Palliative Care Screening   Not Applicable  Skilled Nursing Facility   Not Applicable

## 2024-08-05 NOTE — Progress Notes (Signed)
 [facility]  - [place] at [facility]   PATIENT NAME: Courtney Mack    MR#:  [id number]  DATE OF BIRTH:  09/20/1955  SUBJECTIVE:  Patient was seen and examined today.  Still feeling weak.  She was asking for pure wick due to her urge incontinence stating that she uses depends at baseline.   VITALS:  Blood pressure 113/62, pulse 87, temperature 98.9 F (37.2 C), resp. rate 17, height 4' 11 (1.499 m), weight 97.6 kg, SpO2 96%.  PHYSICAL EXAMINATION:   General.  Obese elderly lady, in no acute distress. Pulmonary.  Lungs clear bilaterally, normal respiratory effort. CV.  Regular rate and rhythm, no JVD, rub or murmur. Abdomen.  Soft, nontender, nondistended, BS positive. CNS.  Alert and oriented .  No focal neurologic deficit. Extremities.  No edema, pulses intact and symmetrical. Psychiatry.  Judgment and insight appears normal.   LABORATORY PANEL:  CBC Recent Labs  Lab 08/03/24 1218  WBC 3.6*  HGB 7.4*  HCT 25.1*  PLT 221    Chemistries  Recent Labs  Lab 07/31/24 1512 08/01/24 0508 08/03/24 1218  NA 137   < > 136  K 5.2*   < > 4.1  CL 104   < > 106  CO2 21*   < > 23  GLUCOSE 156*   < > 119*  BUN 20   < > 23  CREATININE 1.39*   < > 1.02*  CALCIUM  9.2   < > 8.6*  AST 43*  --   --   ALT 13  --   --   ALKPHOS 63  --   --   BILITOT 1.2  --   --    < > = values in this interval not displayed.   Cardiac Enzymes No results for input(s): TROPONINI in the last 168 hours. RADIOLOGY:  No results found.  Hospital course.  69 y.o. female with medical history significant of left-sided hydronephrosis status post nephrostomy tube placement, recurrent UTI and sepsis due to UTI, ESBL, hypertension, COPD, history of extensive bilateral PE and DVT (s/p of IVC filter, on lovenox ), dCHF, who presents with weakness, dysuria and shortness of breath.   Patient was found to have ESBL UTI and bacteremia.  1/29: Hemodynamically stable, repeat blood cultures  from 1/27 remain negative.  Midline ordered as she will need ertapenem  until 08/11/2024.  TOC working on placement  Assessment and Plan:  Complicated UTI (urinary tract infection) ESBL E. coli bacteremia Chronic left Nephrostomy tube since June 2023--s/p exchange by IR on 08/03/24 H/o Endometrial CA Blood and urine cultures positive for ESBL E. coli.  CT abdomen pelvis with appropriately placed nephrostomy but mild hydronephrosis and hydroureter.  No evidence of obstructive uropathy. Continue IV meropenem -can be switched to ertapenem  for discharge, will need antibiotics until 08/11/2024 ID consult.  Case discussed with Dr. Searcy Repeat blood cultures from 1/27 remain negative to date   AKI (acute kidney injury):  Baseline creatinine 0.65 on 05/20/2024.  Creatinine elevated on arrival Creatinine improved to 1 with IV fluid.   Hyperkalemia, resolved  Potassium 5.2.  On arrival , now improved to 4.1 Responded to IV fluids and Lokelma    Chronic diastolic CHF (congestive heart failure) (HCC):  2D echo on 11/21/2022 showed EF of 60-65% with grade 1 diastolic dysfunction. BNP 510, no leg edema, CHF seems to be compensated. Lasix  resumed 1/27   COPD (chronic obstructive pulmonary disease) [facility]):  Patient reports shortness of breath earlier, which has improved in ED.  No oxygen desaturation.  PCR negative for COVID, flu and RSV.  Chest x-ray negative.  does not seem to have COPD exacerbation. Bronchodilators as needed Mucinex    Hypertension:  Blood pressure within goal. Resume Lasix    HLD: Continue Crestor    Recurrent deep vein thrombosis (DVT) (HCC) and History of pulmonary embolism/DVT s/p IVC filter: Home weight-based Lovenox    Iron  deficiency anemia:  Hemoglobin 7.4 on 1/27 -Continue p.o. iron    Obesity, Class III, BMI 40-49.9 (morbid obesity) (HCC):  Patient has Obesity Class III, with body weight  97.1 Kg and BMI 43.24 kg/m2.  Complicating factor in overall care and  prognosis  PT/OT rec Rehab TOC for d/c planning     DVT prophylaxis: Lovenox  Code Status: Full Family Communication: Discussed with patient Consults :ID Level of care: Telemetry Status is: Inpatient Remains inpatient appropriate because: ESBL ecoli sepsis--on IV abxs.     TOTAL TIME TAKING CARE OF THIS PATIENT: 50 minutes.  >50% time spent on counselling and coordination of care  Note: This dictation was prepared with Dragon dictation along with smaller phrase technology. Any transcriptional errors that result from this process are unintentional.  Amaryllis Dare M.D    [facility]   CC: Primary care physician; Center, [facility]

## 2024-08-05 NOTE — Progress Notes (Signed)
 PHARMACY CONSULT NOTE FOR:  OUTPATIENT  PARENTERAL ANTIBIOTIC THERAPY (OPAT)  Indication: ESBL E. Coli bacteremia and obstructive uropathy Regimen: Ertapenem  1gm IV q24h End date: 08/11/2024  Labs - Once weekly:  CBC/D and CMP Fax weekly lab results  promptly to [phone] Please pull PICC/midline at completion of IV antibiotics Call [phone] with any critical value or concerns  IV antibiotic discharge orders are pended. To discharging provider:  please sign these orders via discharge navigator,  Select New Orders & click on the button choice - Manage This Unsigned Work.     Thank you for allowing pharmacy to be a part of this patient's care.  Dynisha Due, PharmD, BCPS, BCIDP Work Cell: [phone] 08/05/2024 4:19 PM

## 2024-08-05 NOTE — Treatment Plan (Signed)
 Diagnosis: ESBL ecoli bacteremia with obstructive uropathy Baseline Creatinine 1.02    Allergies[1]NKDA  OPAT Orders Discharge antibiotics: Ertapenem  1 gram Iv every 24 hours Duration: 10 days End Date: 08/11/24  [facility] Care Per Protocol:  Labs weekly while on IV antibiotics: _X_ CBC with differential  _X_ CMP   _X_ Please pull PIC at completion of IV antibiotics   Fax weekly lab results  promptly to [phone]  Clinic Follow Up Appt: not required with Dr.Nalini Alcaraz Follow up with radiology   Call [phone] with any critical value or concerns      [1] No Known Allergies

## 2024-08-05 NOTE — Plan of Care (Signed)

## 2024-08-05 NOTE — Plan of Care (Signed)

## 2024-08-05 NOTE — TOC Progression Note (Signed)
 Transition of Care [facility]) - Progression Note    Patient Details  Name: Courtney Mack MRN: [id number] Date of Birth: 1955/11/11  [facility]) CM/SW Contact  Corean ONEIDA Haddock, RN Phone Number: 08/05/2024, 3:18 PM  Clinical Narrative:      Met with patient at bedside Bed offers presneted.  Patient accepts [facility].  Accepted in HUB and notified Tammy at [facility].  Tammy is aware that patient will have IV antibiotics until 2/4 Nitchia with IP Care Management has auth started.  Pending [id number]  Darleene with [facility] updated                    Expected Discharge Plan and Services                                               Social Drivers of Health (SDOH) Interventions SDOH Screenings   Food Insecurity: No Food Insecurity (07/31/2024)  Recent Concern: Food Insecurity - Food Insecurity Present (05/14/2024)  Housing: Low Risk (07/31/2024)  Transportation Needs: No Transportation Needs (07/31/2024)  Utilities: Not At Risk (07/31/2024)  Depression (PHQ2-9): Low Risk (04/11/2022)  Social Connections: Socially Isolated (07/31/2024)  Tobacco Use: Low Risk (07/31/2024)    Readmission Risk Interventions    08/02/2024   12:36 PM 04/07/2024    3:24 PM 08/07/2023   10:26 AM  Readmission Risk Prevention Plan  Transportation Screening Complete Complete   PCP or Specialist Appt within 3-5 Days Complete Complete   HRI or Home Care Consult Complete    Social Work Consult for Recovery Care Planning/Counseling  Complete   Palliative Care Screening Not Applicable Not Applicable   Medication Review Oceanographer) Complete Complete Complete  PCP or Specialist appointment within 3-5 days of discharge   Complete  SW Recovery Care/Counseling Consult   Complete  Palliative Care Screening   Not Applicable  Skilled Nursing Facility   Not Applicable

## 2024-08-05 NOTE — Progress Notes (Signed)

## 2024-08-06 DIAGNOSIS — N1330 Unspecified hydronephrosis: Secondary | ICD-10-CM | POA: Diagnosis not present

## 2024-08-06 DIAGNOSIS — E875 Hyperkalemia: Secondary | ICD-10-CM | POA: Diagnosis not present

## 2024-08-06 DIAGNOSIS — N179 Acute kidney failure, unspecified: Secondary | ICD-10-CM | POA: Diagnosis not present

## 2024-08-06 DIAGNOSIS — I5032 Chronic diastolic (congestive) heart failure: Secondary | ICD-10-CM | POA: Diagnosis not present

## 2024-08-06 DIAGNOSIS — N99528 Other complication of other external stoma of urinary tract: Secondary | ICD-10-CM | POA: Diagnosis not present

## 2024-08-06 DIAGNOSIS — I82409 Acute embolism and thrombosis of unspecified deep veins of unspecified lower extremity: Secondary | ICD-10-CM | POA: Diagnosis not present

## 2024-08-06 DIAGNOSIS — R7881 Bacteremia: Secondary | ICD-10-CM | POA: Diagnosis not present

## 2024-08-06 DIAGNOSIS — Z86711 Personal history of pulmonary embolism: Secondary | ICD-10-CM | POA: Diagnosis not present

## 2024-08-06 DIAGNOSIS — D500 Iron deficiency anemia secondary to blood loss (chronic): Secondary | ICD-10-CM | POA: Diagnosis not present

## 2024-08-06 DIAGNOSIS — N390 Urinary tract infection, site not specified: Secondary | ICD-10-CM | POA: Diagnosis not present

## 2024-08-06 DIAGNOSIS — J449 Chronic obstructive pulmonary disease, unspecified: Secondary | ICD-10-CM | POA: Diagnosis not present

## 2024-08-06 DIAGNOSIS — I10 Essential (primary) hypertension: Secondary | ICD-10-CM | POA: Diagnosis not present

## 2024-08-06 LAB — BASIC METABOLIC PANEL WITH GFR
Anion gap: 8 (ref 5–15)
BUN: 18 mg/dL (ref 8–23)
CO2: 25 mmol/L (ref 22–32)
Calcium: 8.1 mg/dL — ABNORMAL LOW (ref 8.9–10.3)
Chloride: 107 mmol/L (ref 98–111)
Creatinine, Ser: 0.76 mg/dL (ref 0.44–1.00)
GFR, Estimated: >60 mL/min
Glucose, Bld: 104 mg/dL — ABNORMAL HIGH (ref 70–99)
Potassium: 4.5 mmol/L (ref 3.5–5.1)
Sodium: 139 mmol/L (ref 135–145)

## 2024-08-06 LAB — CBC
HCT: 24 % — ABNORMAL LOW (ref 36.0–46.0)
Hemoglobin: 7.3 g/dL — ABNORMAL LOW (ref 12.0–15.0)
MCH: 27.1 pg (ref 26.0–34.0)
MCHC: 30.4 g/dL (ref 30.0–36.0)
MCV: 89.2 fL (ref 80.0–100.0)
Platelets: 262 10*3/uL (ref 150–400)
RBC: 2.69 MIL/uL — ABNORMAL LOW (ref 3.87–5.11)
RDW: 17.5 % — ABNORMAL HIGH (ref 11.5–15.5)
WBC: 3.3 10*3/uL — ABNORMAL LOW (ref 4.0–10.5)
nRBC: 0 % (ref 0.0–0.2)

## 2024-08-06 MED ORDER — ZINC OXIDE 40 % EX OINT: TOPICAL_OINTMENT | Freq: Two times a day (BID) | CUTANEOUS | Status: AC

## 2024-08-06 MED ORDER — DM-GUAIFENESIN ER 30-600 MG PO TB12: 1.0000 | ORAL_TABLET | Freq: Two times a day (BID) | ORAL | Status: AC | PRN

## 2024-08-06 MED ORDER — ERTAPENEM IV (FOR PTA / DISCHARGE USE ONLY): 1.0000 g | INTRAVENOUS | 0 refills | Status: AC

## 2024-08-06 NOTE — Plan of Care (Signed)

## 2024-08-06 NOTE — Progress Notes (Signed)
 Physical Therapy Treatment Patient Details Name: Courtney Mack MRN: [id number] DOB: Sep 21, 1955 Today's Date: 08/06/2024   History of Present Illness 69 y.o. female with medical history significant of left-sided hydronephrosis status post nephrostomy tube placement, recurrent UTI and sepsis due to UTI, ESBL, hypertension, COPD, history of extensive bilateral PE and DVT (s/p of IVC filter, on lovenox ), dCHF, who presents with weakness, dysuria and shortness of breath.    PT Comments  Patient received in recliner, she is ready to get back into bed. Reports she is supposed to go to rehab today. Patient is able to stand from recliner with min A. She ambulated 5 feet forward and a few steps back to bed with RW and cga. She is very fatigued and sob with this. Patient requires mod A for bed mobility. She will continue to benefit from skilled PT to improve strength, endurance and safety with mobility.      If plan is discharge home, recommend the following: A little help with walking and/or transfers;A little help with bathing/dressing/bathroom;Help with stairs or ramp for entrance;Assistance with cooking/housework;Assist for transportation   Can travel by private vehicle     No  Equipment Recommendations  Rolling walker (2 wheels)    Recommendations for Other Services       Precautions / Restrictions Precautions Precautions: Fall Recall of Precautions/Restrictions: Intact Restrictions Weight Bearing Restrictions Per Provider Order: No     Mobility  Bed Mobility Overal bed mobility: Needs Assistance Bed Mobility: Sit to Supine     Supine to sit: Mod assist     General bed mobility comments: mod A to bring LEs back up onto bed, required +2 to scoot up in bed in supine.    Transfers Overall transfer level: Needs assistance Equipment used: Rolling walker (2 wheels) Transfers: Sit to/from Stand Sit to Stand: Contact guard assist                 Ambulation/Gait Ambulation/Gait assistance: Contact guard assist Gait Distance (Feet): 5 Feet Assistive device: Rolling walker (2 wheels) Gait Pattern/deviations: Step-to pattern, Decreased step length - right, Decreased step length - left, Decreased stride length Gait velocity: decreased     General Gait Details: PAtient able to ambulate ~ 5 feet with RW. Very fatigued and SOB with this.   Stairs             Wheelchair Mobility     Tilt Bed    Modified Rankin (Stroke Patients Only)       Balance Overall balance assessment: Needs assistance Sitting-balance support: Feet supported Sitting balance-Leahy Scale: Good     Standing balance support: Bilateral upper extremity supported, During functional activity, Reliant on assistive device for balance Standing balance-Leahy Scale: Fair                              Hotel Manager: No apparent difficulties  Cognition Arousal: Alert Behavior During Therapy: WFL for tasks assessed/performed   PT - Cognitive impairments: No apparent impairments                       PT - Cognition Comments: Pt is A and O x 4. Does have a little anxiety noted when in standing with SOB after very minimal activity Following commands: Intact      Cueing Cueing Techniques: Verbal cues  Exercises      General Comments        Pertinent  Vitals/Pain Pain Assessment Pain Assessment: No/denies pain    Home Living                          Prior Function            PT Goals (current goals can now be found in the care plan section) Acute Rehab PT Goals Patient Stated Goal: get well so I can go home with my daughter and 5 grandkids PT Goal Formulation: With patient Time For Goal Achievement: 08/23/24 Potential to Achieve Goals: Good Progress towards PT goals: Progressing toward goals    Frequency    Min 2X/week      PT Plan      Co-evaluation               AM-PAC PT 6 Clicks Mobility   Outcome Measure  Help needed turning from your back to your side while in a flat bed without using bedrails?: A Little Help needed moving from lying on your back to sitting on the side of a flat bed without using bedrails?: A Little Help needed moving to and from a bed to a chair (including a wheelchair)?: A Little Help needed standing up from a chair using your arms (e.g., wheelchair or bedside chair)?: A Little Help needed to walk in hospital room?: A Lot Help needed climbing 3-5 steps with a railing? : A Lot 6 Click Score: 16    End of Session   Activity Tolerance: Patient limited by fatigue Patient left: in bed;with call bell/phone within reach;with bed alarm set Nurse Communication: Mobility status PT Visit Diagnosis: Other abnormalities of gait and mobility (R26.89);Difficulty in walking, not elsewhere classified (R26.2);Muscle weakness (generalized) (M62.81)     Time: 1130-1146 PT Time Calculation (min) (ACUTE ONLY): 16 min  Charges:    $Gait Training: 8-22 mins PT General Charges $$ ACUTE PT VISIT: 1 Visit                     Kedarius Aloisi, PT, GCS 08/06/24,12:04 PM

## 2024-08-06 NOTE — Plan of Care (Signed)
  Problem: Education: Goal: Knowledge of General Education information will improve Description: Including pain rating scale, medication(s)/side effects and non-pharmacologic comfort measures Outcome: Progressing   Problem: Health Behavior/Discharge Planning: Goal: Ability to manage health-related needs will improve Outcome: Progressing   Problem: Clinical Measurements: Goal: Ability to maintain clinical measurements within normal limits will improve Outcome: Progressing Goal: Will remain free from infection Outcome: Progressing Goal: Diagnostic test results will improve Outcome: Progressing Goal: Respiratory complications will improve Outcome: Progressing Goal: Cardiovascular complication will be avoided Outcome: Progressing   Problem: Nutrition: Goal: Adequate nutrition will be maintained Outcome: Progressing   Problem: Coping: Goal: Level of anxiety will decrease Outcome: Progressing   Problem: Elimination: Goal: Will not experience complications related to bowel motility Outcome: Progressing Goal: Will not experience complications related to urinary retention Outcome: Progressing   Problem: Pain Managment: Goal: General experience of comfort will improve and/or be controlled Outcome: Progressing

## 2024-08-06 NOTE — Progress Notes (Signed)
 Report given to [facility] at [facility]. Patient number 5 in line for transport by [facility].

## 2024-08-06 NOTE — Progress Notes (Signed)
 Mobility Specialist - Progress Note   08/06/24 1036  Mobility  Activity Stood with assistance;Pivoted/transferred from bed to chair  Level of Assistance Contact guard assist, steadying assist  Assistive Device Front wheel walker  Distance Ambulated (ft) 2 ft  Activity Response Tolerated well  Mobility visit 1 Mobility  Mobility Specialist Start Time (ACUTE ONLY) 1018  Mobility Specialist Stop Time (ACUTE ONLY) 1034  Mobility Specialist Time Calculation (min) (ACUTE ONLY) 16 min   Pt semi fowler upon entry, utilizing RA. She completed bed mob with MinA to bring BLE EOB, standing from the EOB w/ MinG-SBA-- no physical assist required. Pt transferred to the recliner via SPT CGA, tolerated well. Pt left seated in the recliner with alarm set and needs within reach.  America Silvan Mobility Specialist 08/06/24 10:38 AM

## 2024-08-06 NOTE — TOC Transition Note (Signed)
 Transition of Care [facility]) - Discharge Note   Patient Details  Name: Courtney Mack MRN: [id number] Date of Birth: [birth year]  Transition of [facility]) CM/SW Contact:  Courtney JONETTA Hamilton, RN Phone Number: 08/06/2024, 11:14 AM   Clinical Narrative:     Patient will DC to: [facility] Anticipated DC date: 08/06/24 Family notified: Courtney Mack Transport by: Courtney Mack  Per MD patient ready for DC to [facility]. RN, patient, patient's family, and facility notified of DC. Discharge Summary sent to facility. RN given number for report. DC packet on chart. Ambulance transport requested for patient.   TOC signing off.   Final next level of care: Skilled Nursing Facility Barriers to Discharge: Barriers Resolved   Patient Goals and CMS Choice            Discharge Placement              Patient chooses bed at: [facility] [place] Patient to be transferred to facility by: [facility] Name of family member notified: Courtney Mack Patient and family notified of of transfer: 08/06/24  Discharge Plan and Services Additional resources added to the After Visit Summary for                                       Social Drivers of Health (SDOH) Interventions SDOH Screenings   Food Insecurity: No Food Insecurity (07/31/2024)  Recent Concern: Food Insecurity - Food Insecurity Present (05/14/2024)  Housing: Low Risk (07/31/2024)  Transportation Needs: No Transportation Needs (07/31/2024)  Utilities: Not At Risk (07/31/2024)  Depression (PHQ2-9): Low Risk (04/11/2022)  Social Connections: Socially Isolated (07/31/2024)  Tobacco Use: Low Risk (07/31/2024)     Readmission Risk Interventions    08/02/2024   12:36 PM 04/07/2024    3:24 PM 08/07/2023   10:26 AM  Readmission Risk Prevention Plan  Transportation Screening Complete Complete   PCP or Specialist Appt within 3-5 Days Complete Complete   HRI or Home Care Consult Complete    Social Work Consult for Recovery Care  Planning/Counseling  Complete   Palliative Care Screening Not Applicable Not Applicable   Medication Review Oceanographer) Complete Complete Complete  PCP or Specialist appointment within 3-5 days of discharge   Complete  SW Recovery Care/Counseling Consult   Complete  Palliative Care Screening   Not Applicable  Skilled Nursing Facility   Not Applicable

## 2024-08-08 LAB — CULTURE, BLOOD (ROUTINE X 2)
Culture: NO GROWTH
Culture: NO GROWTH
Special Requests: ADEQUATE
